# Patient Record
Sex: Male | Born: 1950
Health system: Southern US, Community
[De-identification: ages and names within clinical notes are randomized; demographics above are authoritative.]

## PROBLEM LIST (undated history)

## (undated) DIAGNOSIS — I5033 Acute on chronic diastolic (congestive) heart failure: Secondary | ICD-10-CM

## (undated) DIAGNOSIS — I10 Essential (primary) hypertension: Secondary | ICD-10-CM

## (undated) DIAGNOSIS — I350 Nonrheumatic aortic (valve) stenosis: Secondary | ICD-10-CM

## (undated) DIAGNOSIS — I5041 Acute combined systolic (congestive) and diastolic (congestive) heart failure: Secondary | ICD-10-CM

## (undated) DIAGNOSIS — I2510 Atherosclerotic heart disease of native coronary artery without angina pectoris: Secondary | ICD-10-CM

## (undated) DIAGNOSIS — I739 Peripheral vascular disease, unspecified: Secondary | ICD-10-CM

## (undated) DIAGNOSIS — E785 Hyperlipidemia, unspecified: Secondary | ICD-10-CM

## (undated) DIAGNOSIS — J9601 Acute respiratory failure with hypoxia: Secondary | ICD-10-CM

## (undated) DIAGNOSIS — I509 Heart failure, unspecified: Secondary | ICD-10-CM

## (undated) DIAGNOSIS — E119 Type 2 diabetes mellitus without complications: Secondary | ICD-10-CM

## (undated) DIAGNOSIS — I4891 Unspecified atrial fibrillation: Secondary | ICD-10-CM

## (undated) HISTORY — PX: CARDIAC CATHETERIZATION: SHX172

## (undated) HISTORY — DX: Atherosclerotic heart disease of native coronary artery without angina pectoris: I25.10

## (undated) HISTORY — DX: Heart failure, unspecified: I50.9

---

## 2000-12-20 ENCOUNTER — Emergency Department (HOSPITAL_COMMUNITY): Admission: EM | Admit: 2000-12-20 | Discharge: 2000-12-20 | Payer: Self-pay | Admitting: Emergency Medicine

## 2009-01-17 ENCOUNTER — Ambulatory Visit: Payer: Self-pay | Admitting: Family Medicine

## 2009-01-17 ENCOUNTER — Inpatient Hospital Stay (HOSPITAL_COMMUNITY): Admission: EM | Admit: 2009-01-17 | Discharge: 2009-01-21 | Payer: Self-pay | Admitting: Emergency Medicine
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
## 2009-01-18 LAB — CONVERTED CEMR LAB: Cholesterol: 155 mg/dL
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
## 2009-01-21 ENCOUNTER — Encounter (INDEPENDENT_AMBULATORY_CARE_PROVIDER_SITE_OTHER): Payer: Self-pay | Admitting: General Surgery

## 2009-01-21 ENCOUNTER — Encounter: Payer: Self-pay | Admitting: Family Medicine

## 2009-01-21 ENCOUNTER — Ambulatory Visit: Payer: Self-pay | Admitting: Vascular Surgery

## 2009-01-21 LAB — CONVERTED CEMR LAB: Hgb A1c MFr Bld: 11.4 %

## 2009-01-23 ENCOUNTER — Ambulatory Visit: Payer: Self-pay | Admitting: Family Medicine

## 2009-01-23 DIAGNOSIS — E1122 Type 2 diabetes mellitus with diabetic chronic kidney disease: Secondary | ICD-10-CM | POA: Insufficient documentation

## 2009-01-23 DIAGNOSIS — E1149 Type 2 diabetes mellitus with other diabetic neurological complication: Secondary | ICD-10-CM

## 2009-01-23 DIAGNOSIS — I10 Essential (primary) hypertension: Secondary | ICD-10-CM | POA: Insufficient documentation

## 2009-01-23 DIAGNOSIS — S72009A Fracture of unspecified part of neck of unspecified femur, initial encounter for closed fracture: Secondary | ICD-10-CM | POA: Insufficient documentation

## 2009-01-23 DIAGNOSIS — E1165 Type 2 diabetes mellitus with hyperglycemia: Secondary | ICD-10-CM

## 2009-02-06 ENCOUNTER — Telehealth: Payer: Self-pay | Admitting: Family Medicine

## 2009-02-11 ENCOUNTER — Telehealth: Payer: Self-pay | Admitting: *Deleted

## 2009-02-15 ENCOUNTER — Ambulatory Visit: Payer: Self-pay | Admitting: Family Medicine

## 2009-02-15 LAB — CONVERTED CEMR LAB
BUN: 16 mg/dL (ref 6–23)
CO2: 23 meq/L (ref 19–32)
Calcium: 9.8 mg/dL (ref 8.4–10.5)
Creatinine, Ser: 0.73 mg/dL (ref 0.40–1.50)
Glucose, Bld: 122 mg/dL — ABNORMAL HIGH (ref 70–99)

## 2009-03-07 ENCOUNTER — Telehealth: Payer: Self-pay | Admitting: *Deleted

## 2009-03-29 ENCOUNTER — Telehealth: Payer: Self-pay | Admitting: Family Medicine

## 2009-04-09 ENCOUNTER — Telehealth: Payer: Self-pay | Admitting: Family Medicine

## 2009-04-15 ENCOUNTER — Telehealth: Payer: Self-pay | Admitting: *Deleted

## 2009-04-23 ENCOUNTER — Telehealth: Payer: Self-pay | Admitting: Family Medicine

## 2009-05-14 ENCOUNTER — Telehealth: Payer: Self-pay | Admitting: Family Medicine

## 2009-05-22 ENCOUNTER — Ambulatory Visit: Payer: Self-pay | Admitting: Family Medicine

## 2009-05-22 DIAGNOSIS — N529 Male erectile dysfunction, unspecified: Secondary | ICD-10-CM

## 2009-05-24 DIAGNOSIS — E291 Testicular hypofunction: Secondary | ICD-10-CM

## 2009-05-24 LAB — CONVERTED CEMR LAB
BUN: 14 mg/dL (ref 6–23)
Chloride: 101 meq/L (ref 96–112)
Creatinine, Ser: 0.76 mg/dL (ref 0.40–1.50)
Testosterone: 220.87 ng/dL — ABNORMAL LOW (ref 350–890)

## 2009-06-04 ENCOUNTER — Telehealth: Payer: Self-pay | Admitting: Family Medicine

## 2009-06-07 ENCOUNTER — Ambulatory Visit: Payer: Self-pay | Admitting: Family Medicine

## 2009-06-21 ENCOUNTER — Ambulatory Visit: Payer: Self-pay | Admitting: Family Medicine

## 2009-07-05 ENCOUNTER — Ambulatory Visit: Payer: Self-pay | Admitting: Family Medicine

## 2009-07-08 ENCOUNTER — Telehealth: Payer: Self-pay | Admitting: Family Medicine

## 2009-07-19 ENCOUNTER — Ambulatory Visit: Payer: Self-pay | Admitting: Family Medicine

## 2010-03-27 ENCOUNTER — Encounter (INDEPENDENT_AMBULATORY_CARE_PROVIDER_SITE_OTHER): Payer: Self-pay | Admitting: *Deleted

## 2010-03-28 ENCOUNTER — Encounter: Payer: Self-pay | Admitting: *Deleted

## 2010-05-27 NOTE — Miscellaneous (Signed)
  Clinical Lists Changes Medical Records Request Received medical records request records went to [facility] Faxed on 02-26-2009 Sherrie George [facility])  March 27, 2010 9:48 AM

## 2010-05-27 NOTE — Progress Notes (Signed)
Summary: refill  Phone Note Refill Request Call back at [phone] Message from:  Patient  Refills Requested: Medication #1:  HYDROCODONE-ACETAMINOPHEN 5-500 MG TABS one by mouth q4h as needed pain Please call when ready  Initial call taken by: Audie Clear,  June 04, 2009 4:33 PM  Follow-up for Phone Call        to pcp Follow-up by: Elige Radon RN,  June 04, 2009 4:41 PM  Additional Follow-up for Phone Call Additional follow up Details #1::        Rx placed in envelope at front. Additional Follow-up by: Madison Hickman MD,  June 05, 2009 9:20 AM    New/Updated Medications: HYDROCODONE-ACETAMINOPHEN 5-500 MG TABS (HYDROCODONE-ACETAMINOPHEN) one by mouth q4h as needed pain Prescriptions: HYDROCODONE-ACETAMINOPHEN 5-500 MG TABS (HYDROCODONE-ACETAMINOPHEN) one by mouth q4h as needed pain  #45 x 0   Entered and Authorized by:   Madison Hickman MD   Signed by:   Madison Hickman MD on 06/05/2009   Method used:   Handwritten   [id number]  informed pt that rx was ready for pick up.Elige Radon RN  June 05, 2009 11:46 AM

## 2010-05-27 NOTE — Progress Notes (Signed)
Summary: Rx Req  Phone Note Refill Request Call back at Home Phone [phone] Message from:  Patient  Refills Requested: Medication #1:  HYDROCODONE-ACETAMINOPHEN 5-500 MG TABS one by mouth q4h as needed pain PLEASE CALL WHEN READY TO PICK UP.  Initial call taken by: Raymond Gurney,  May 14, 2009 3:24 PM  Follow-up for Phone Call        will forward to MD. Follow-up by: Marcell Barlow RN,  May 14, 2009 4:35 PM  Additional Follow-up for Phone Call Additional follow up Details #1::        I will put handwritten Rx up front along with a note saying he needs an appointment before any more refills.  Called and informed. Additional Follow-up by: Madison Hickman MD,  May 14, 2009 5:17 PM    Prescriptions: HYDROCODONE-ACETAMINOPHEN 5-500 MG TABS (HYDROCODONE-ACETAMINOPHEN) one by mouth q4h as needed pain  #30 x 0   Entered and Authorized by:   Madison Hickman MD   Signed by:   Madison Hickman MD on 05/14/2009   Method used:   Handwritten   RxID:   [id number]

## 2010-05-27 NOTE — Assessment & Plan Note (Signed)
Summary: testosterone inj,df  Nurse Visit   Allergies: No Known Drug Allergies  Medication Administration  Injection # 1:    Medication: Testosterone Cypionate to 100 MG    Diagnosis: ERECTILE DYSFUNCTION [id number])    Route: IM    Site: LUOQ gluteus    Exp Date: 12/2011    Lot #: [id number]    Mfr: [email or web address]    Comments: patient brings in his own medication. no charge for mediciation.  testosterone 100 mg given IM    Patient tolerated injection without complications    Given by: Marcell Barlow RN (June 07, 2009 4:18 PM)  Orders Added: 1)  Admin of Injection (IM/SQ) [phone]   Medication Administration  Injection # 1:    Medication: Testosterone Cypionate to 100 MG    Diagnosis: ERECTILE DYSFUNCTION (ICD-607.84)    Route: IM    Site: LUOQ gluteus    Exp Date: 12/2011    Lot #: [id number]    Mfr: [email or web address]    Comments: patient brings in his own medication. no charge for mediciation.  testosterone 100 mg given IM    Patient tolerated injection without complications    Given by: Marcell Barlow RN (June 07, 2009 4:18 PM)  Orders Added: 1)  Admin of Injection (IM/SQ) [phone]

## 2010-05-27 NOTE — Assessment & Plan Note (Signed)
Summary: testosterone shot,df  Nurse Visit   Allergies: No Known Drug Allergies  Medication Administration  Injection # 1:    Medication: Testosterone Cypionat 200mg  ing    Diagnosis: ERECTILE DYSFUNCTION [id number])    Route: IM    Site: RUOQ gluteus    Exp Date: 12/2011    Lot #: [id number]    Mfr: sandoz    Comments: Teststerone 100 mg given IM.  patient bring his own medication , no charge for medication.    Patient tolerated injection without complications    Given by: Marcell Barlow RN (July 19, 2009 3:07 PM)  Orders Added: 1)  Admin of Injection (IM/SQ) [phone]   Medication Administration  Injection # 1:    Medication: Testosterone Cypionat 200mg  ing    Diagnosis: ERECTILE DYSFUNCTION (ICD-607.84)    Route: IM    Site: RUOQ gluteus    Exp Date: 12/2011    Lot #: [id number]    Mfr: sandoz    Comments: Teststerone 100 mg given IM.  patient bring his own medication , no charge for medication.    Patient tolerated injection without complications    Given by: Marcell Barlow RN (July 19, 2009 3:07 PM)  Orders Added: 1)  Admin of Injection (IM/SQ) [phone]

## 2010-05-27 NOTE — Assessment & Plan Note (Signed)
Summary: testerone inj,tcb  Nurse Visit   Allergies: No Known Drug Allergies  Medication Administration  Injection # 1:    Medication: Testosterone Cypionate to 100 MG    Diagnosis: ERECTILE DYSFUNCTION [id number])    Route: IM    Site: LUOQ gluteus    Exp Date: 12/2011    Lot #: [id number]    Mfr: sandoz    Comments: patient brings in his own medication. no charge for med.  Testosterone Cypionate 100 mg given IM.    Patient tolerated injection without complications    Given by: Marcell Barlow RN (July 05, 2009 5:22 PM)  Orders Added: 1)  Admin of Injection (IM/SQ) [phone]   Medication Administration  Injection # 1:    Medication: Testosterone Cypionate to 100 MG    Diagnosis: ERECTILE DYSFUNCTION (ICD-607.84)    Route: IM    Site: LUOQ gluteus    Exp Date: 12/2011    Lot #: [id number]    Mfr: sandoz    Comments: patient brings in his own medication. no charge for med.  Testosterone Cypionate 100 mg given IM.    Patient tolerated injection without complications    Given by: Marcell Barlow RN (July 05, 2009 5:22 PM)  Orders Added: 1)  Admin of Injection (IM/SQ) [phone]

## 2010-05-27 NOTE — Progress Notes (Signed)
Summary: Refill  Phone Note Call from Patient Call back at [phone]   Reason for Call: Talk to Doctor Summary of Call: pt is requesting refill on viagra, pt goes to [facility]/[address] Initial call taken by: Samara Snide,  July 08, 2009 2:19 PM  Follow-up for Phone Call        to PCP Follow-up by: Isabelle Course,  July 09, 2009 1:48 PM  Additional Follow-up for Phone Call Additional follow up Details #1::        Called and discussed.  He is getting testosterone injections and using a vacuum assist device.  He has tried and failed viagra in the past.  Will try again now that he is getting testosterone. Additional Follow-up by: Madison Hickman MD,  July 09, 2009 2:34 PM    New/Updated Medications: VIAGRA 50 MG TABS (SILDENAFIL CITRATE) One by mouth 1/2 prior to activity Prescriptions: VIAGRA 50 MG TABS (SILDENAFIL CITRATE) One by mouth 1/2 prior to activity  #6 x 12   Entered and Authorized by:   Madison Hickman MD   Signed by:   Madison Hickman MD on 07/09/2009   Method used:   Electronically to        [address] (retail)       [address]       [place], [place]  [address]       Ph: [phone]       Fax: [phone]   RxID:   [id number]

## 2010-05-27 NOTE — Assessment & Plan Note (Signed)
Summary: testastrone inj,tcb  Nurse Visit   Allergies: No Known Drug Allergies  Medication Administration  Injection # 1:    Medication: Testosterone Cypionate to 100 MG    Diagnosis: ERECTILE DYSFUNCTION [id number])    Route: IM    Site: RUOQ gluteus    Exp Date: 12/2011    Lot #: [id number]    Mfr: sandoz    Comments: no charge for medication. brings bring medication to office to be administered. Testosterone Cypionate 100 mg given  IM    Patient tolerated injection without complications    Given by: Marcell Barlow RN (June 21, 2009 4:02 PM)  Orders Added: 1)  Admin of Injection (IM/SQ) [phone]   Medication Administration  Injection # 1:    Medication: Testosterone Cypionate to 100 MG    Diagnosis: ERECTILE DYSFUNCTION (ICD-607.84)    Route: IM    Site: RUOQ gluteus    Exp Date: 12/2011    Lot #: [id number]    Mfr: sandoz    Comments: no charge for medication. brings bring medication to office to be administered. Testosterone Cypionate 100 mg given  IM    Patient tolerated injection without complications    Given by: Marcell Barlow RN (June 21, 2009 4:02 PM)  Orders Added: 1)  Admin of Injection (IM/SQ) [phone]
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
## 2010-07-07 ENCOUNTER — Other Ambulatory Visit: Payer: Self-pay | Admitting: Family Medicine

## 2010-07-07 NOTE — Telephone Encounter (Signed)
Refill request

## 2010-07-29 NOTE — Miscellaneous (Signed)
  Clinical Lists Changes Medical Record Request Received medical record request Records went to [facility] attorney at law Faxed on 03/18/2009 [facility] Pysher  March 28, 2010 3:29 PM

## 2010-08-01 LAB — BASIC METABOLIC PANEL
BUN: 10 mg/dL (ref 6–23)
BUN: 10 mg/dL (ref 6–23)
BUN: 9 mg/dL (ref 6–23)
CO2: 27 mEq/L (ref 19–32)
CO2: 27 mEq/L (ref 19–32)
CO2: 28 mEq/L (ref 19–32)
Calcium: 8 mg/dL — ABNORMAL LOW (ref 8.4–10.5)
Chloride: 95 mEq/L — ABNORMAL LOW (ref 96–112)
Chloride: 95 mEq/L — ABNORMAL LOW (ref 96–112)
Chloride: 98 mEq/L (ref 96–112)
Chloride: 99 mEq/L (ref 96–112)
Creatinine, Ser: 0.74 mg/dL (ref 0.4–1.5)
GFR calc Af Amer: >60 mL/min (ref >60)
GFR calc non Af Amer: >60 mL/min (ref >60)
Glucose, Bld: 252 mg/dL — ABNORMAL HIGH (ref 70–99)
Glucose, Bld: 315 mg/dL — ABNORMAL HIGH (ref 70–99)
Potassium: 3.6 mEq/L (ref 3.5–5.1)
Potassium: 3.6 mEq/L (ref 3.5–5.1)
Potassium: 4.2 mEq/L (ref 3.5–5.1)
Sodium: 128 mEq/L — ABNORMAL LOW (ref 135–145)
Sodium: 130 mEq/L — ABNORMAL LOW (ref 135–145)

## 2010-08-01 LAB — GLUCOSE, CAPILLARY
Glucose-Capillary: 226 mg/dL — ABNORMAL HIGH (ref 70–99)
Glucose-Capillary: 236 mg/dL — ABNORMAL HIGH (ref 70–99)
Glucose-Capillary: 240 mg/dL — ABNORMAL HIGH (ref 70–99)
Glucose-Capillary: 260 mg/dL — ABNORMAL HIGH (ref 70–99)
Glucose-Capillary: 265 mg/dL — ABNORMAL HIGH (ref 70–99)
Glucose-Capillary: 276 mg/dL — ABNORMAL HIGH (ref 70–99)
Glucose-Capillary: 294 mg/dL — ABNORMAL HIGH (ref 70–99)

## 2010-08-01 LAB — CBC
HCT: 27.7 % — ABNORMAL LOW (ref 39.0–52.0)
HCT: 29.1 % — ABNORMAL LOW (ref 39.0–52.0)
HCT: 36 % — ABNORMAL LOW (ref 39.0–52.0)
HCT: 45 % (ref 39.0–52.0)
Hemoglobin: 10.1 g/dL — ABNORMAL LOW (ref 13.0–17.0)
Hemoglobin: 10.5 g/dL — ABNORMAL LOW (ref 13.0–17.0)
MCHC: 34.7 g/dL (ref 30.0–36.0)
MCHC: 35.2 g/dL (ref 30.0–36.0)
MCV: 93.3 fL (ref 78.0–100.0)
MCV: 93.6 fL (ref 78.0–100.0)
MCV: 94.5 fL (ref 78.0–100.0)
MCV: 95.2 fL (ref 78.0–100.0)
Platelets: 219 10*3/uL (ref 150–400)
Platelets: 225 10*3/uL (ref 150–400)
Platelets: 307 10*3/uL (ref 150–400)
RBC: 3.22 MIL/uL — ABNORMAL LOW (ref 4.22–5.81)
RDW: 12.5 % (ref 11.5–15.5)
RDW: 12.5 % (ref 11.5–15.5)
WBC: 14.8 10*3/uL — ABNORMAL HIGH (ref 4.0–10.5)
WBC: 5.5 10*3/uL (ref 4.0–10.5)
WBC: 7.6 10*3/uL (ref 4.0–10.5)
WBC: 7.9 10*3/uL (ref 4.0–10.5)

## 2010-08-01 LAB — APTT: aPTT: 24 seconds (ref 24–37)

## 2010-08-01 LAB — CULTURE, BLOOD (ROUTINE X 2)
Culture: NO GROWTH
Culture: NO GROWTH

## 2010-08-01 LAB — POCT I-STAT 4, (NA,K, GLUC, HGB,HCT)
HCT: 48 % (ref 39.0–52.0)
Hemoglobin: 15.3 g/dL (ref 13.0–17.0)
Hemoglobin: 16.3 g/dL (ref 13.0–17.0)
Potassium: 4.8 mEq/L (ref 3.5–5.1)
Sodium: 134 mEq/L — ABNORMAL LOW (ref 135–145)

## 2010-08-01 LAB — URINALYSIS, ROUTINE W REFLEX MICROSCOPIC
Bilirubin Urine: NEGATIVE
Bilirubin Urine: NEGATIVE
Hgb urine dipstick: NEGATIVE
Ketones, ur: 15 mg/dL — AB
Leukocytes, UA: NEGATIVE
Nitrite: NEGATIVE
Nitrite: NEGATIVE
Specific Gravity, Urine: 1.039 — ABNORMAL HIGH (ref 1.005–1.030)
Specific Gravity, Urine: >1.046 — ABNORMAL HIGH (ref 1.005–1.030)
Urobilinogen, UA: 1 mg/dL (ref 0.0–1.0)
pH: 5 (ref 5.0–8.0)

## 2010-08-01 LAB — POCT I-STAT, CHEM 8
BUN: 15 mg/dL (ref 6–23)
Sodium: 134 mEq/L — ABNORMAL LOW (ref 135–145)
TCO2: 25 mmol/L (ref 0–100)

## 2010-08-01 LAB — URINE CULTURE: Culture: NO GROWTH

## 2010-08-01 LAB — LIPID PANEL
Total CHOL/HDL Ratio: 4.7 RATIO
VLDL: 25 mg/dL (ref 0–40)

## 2010-08-01 LAB — HEMOGLOBIN A1C
Hgb A1c MFr Bld: 11 % — ABNORMAL HIGH (ref 4.6–6.1)
Mean Plasma Glucose: 280 mg/dL

## 2010-08-01 LAB — URINE MICROSCOPIC-ADD ON

## 2010-08-01 LAB — TYPE AND SCREEN: ABO/RH(D): A POS

## 2010-08-13 ENCOUNTER — Other Ambulatory Visit: Payer: Self-pay | Admitting: Family Medicine

## 2010-08-13 NOTE — Telephone Encounter (Signed)
Refill request

## 2010-08-14 NOTE — Telephone Encounter (Signed)
Refilled for one month only.  Not seen since 04/2009.  Must make appointment.

## 2010-09-17 ENCOUNTER — Other Ambulatory Visit: Payer: Self-pay | Admitting: Family Medicine

## 2010-09-18 NOTE — Telephone Encounter (Signed)
Refill request

## 2010-09-27 ENCOUNTER — Emergency Department (HOSPITAL_COMMUNITY)
Admission: EM | Admit: 2010-09-27 | Discharge: 2010-09-27 | Disposition: A | Discharge status: Home / Self Care | Payer: Self-pay | Attending: Emergency Medicine | Admitting: Emergency Medicine

## 2010-09-27 DIAGNOSIS — I10 Essential (primary) hypertension: Secondary | ICD-10-CM | POA: Insufficient documentation

## 2010-09-27 DIAGNOSIS — Z794 Long term (current) use of insulin: Secondary | ICD-10-CM | POA: Insufficient documentation

## 2010-09-27 DIAGNOSIS — E119 Type 2 diabetes mellitus without complications: Secondary | ICD-10-CM | POA: Insufficient documentation

## 2010-09-27 DIAGNOSIS — L255 Unspecified contact dermatitis due to plants, except food: Secondary | ICD-10-CM | POA: Insufficient documentation

## 2010-09-27 LAB — GLUCOSE, CAPILLARY: Glucose-Capillary: 297 mg/dL — ABNORMAL HIGH (ref 70–99)

## 2010-09-29 ENCOUNTER — Telehealth: Payer: Self-pay | Admitting: Family Medicine

## 2010-09-29 MED ORDER — METFORMIN HCL 1000 MG PO TABS: 1000.0000 mg | ORAL_TABLET | Freq: Two times a day (BID) | ORAL | Status: DC

## 2010-09-29 MED ORDER — LISINOPRIL 10 MG PO TABS: 10.0000 mg | ORAL_TABLET | Freq: Every day | ORAL | Status: DC

## 2010-09-29 MED ORDER — INSULIN GLARGINE 100 UNIT/ML ~~LOC~~ SOLN: 30.0000 [IU] | Freq: Every day | SUBCUTANEOUS | Status: DC

## 2010-09-29 NOTE — Telephone Encounter (Signed)
He was last seen 05/22/2009.  Will route this note to DR. Hensel to determine if meds should be filled before the OV on 10/24/10.

## 2010-09-29 NOTE — Telephone Encounter (Signed)
Mr. Buggy need refills sent to [facility] on [address] for his lisinopril,metformin and lantus.  Called pharmacy and they were told they the meds could not be refilled.  Told pt might be due to him needing to be seen.  Sched appt for 6/29 with Dr. Gertha Calkin so need meds called in before appt.

## 2010-09-29 NOTE — Telephone Encounter (Signed)
These requests have come in repeatedly and I have informed him he must make an appointment.  I am glad he has an appointment for 6/29 and I will refill one more time.

## 2010-10-02 ENCOUNTER — Other Ambulatory Visit: Payer: Self-pay | Admitting: Family Medicine

## 2010-10-03 NOTE — Telephone Encounter (Signed)
Refill request

## 2010-10-24 ENCOUNTER — Ambulatory Visit (INDEPENDENT_AMBULATORY_CARE_PROVIDER_SITE_OTHER): Payer: Self-pay | Admitting: Family Medicine

## 2010-10-24 ENCOUNTER — Encounter: Payer: Self-pay | Admitting: Family Medicine

## 2010-10-24 VITALS — BP 145/79 | HR 102 | Temp 98.3°F | Ht 72.0 in | Wt 245.0 lb

## 2010-10-24 DIAGNOSIS — I10 Essential (primary) hypertension: Secondary | ICD-10-CM

## 2010-10-24 DIAGNOSIS — Z125 Encounter for screening for malignant neoplasm of prostate: Secondary | ICD-10-CM

## 2010-10-24 DIAGNOSIS — E119 Type 2 diabetes mellitus without complications: Secondary | ICD-10-CM

## 2010-10-24 DIAGNOSIS — E7800 Pure hypercholesterolemia, unspecified: Secondary | ICD-10-CM

## 2010-10-24 LAB — LIPID PANEL: LDL Cholesterol: 146 mg/dL — ABNORMAL HIGH (ref 0–99)

## 2010-10-24 LAB — POCT GLYCOSYLATED HEMOGLOBIN (HGB A1C): Hemoglobin A1C: 8.6

## 2010-10-24 MED ORDER — METFORMIN HCL 1000 MG PO TABS: 1000.0000 mg | ORAL_TABLET | Freq: Two times a day (BID) | ORAL | Status: DC

## 2010-10-24 MED ORDER — INSULIN GLARGINE 100 UNIT/ML ~~LOC~~ SOLN: 35.0000 [IU] | Freq: Every day | SUBCUTANEOUS | Status: DC

## 2010-10-24 MED ORDER — LISINOPRIL-HYDROCHLOROTHIAZIDE 20-12.5 MG PO TABS: 1.0000 | ORAL_TABLET | Freq: Every day | ORAL | Status: DC

## 2010-10-24 NOTE — Patient Instructions (Signed)
I changed (increased) your Blood pressure med Increase insulin to 35 units daily Make sure you see Bonna Gains to see if you qualify for financial help Please send back the poop cards  See me in one month.

## 2010-10-24 NOTE — Progress Notes (Signed)
  Subjective:    Patient ID: Rodney [name], male    DOB: 02/21/1951, 60 y.o.   MRN: [id number]  HPI Rodney [name] has been laid off of his job, has no income and did not have follow up visits as a priority.  Also, now that he is not working, he has become a "couch potato" and has put on significant wt.  Only here because I would not refill meds.  Was not aware of the [employer]   Review of Systems  Feel fine.  Denies CP and SOB     Objective:   Physical Exam Heent nl Neck, supple without masses Lungs clear Cardiac RRR without m Abd benign Ext without edema       Assessment & Plan:

## 2010-10-24 NOTE — Assessment & Plan Note (Signed)
Poor control, Add HCTZ

## 2010-10-25 LAB — COMPLETE METABOLIC PANEL WITH GFR
ALT: 27 U/L (ref 0–53)
Albumin: 4.2 g/dL (ref 3.5–5.2)
CO2: 28 mEq/L (ref 19–32)
Calcium: 9.5 mg/dL (ref 8.4–10.5)
Chloride: 101 mEq/L (ref 96–112)
GFR, Est African American: >60 mL/min (ref >60)
Glucose, Bld: 181 mg/dL — ABNORMAL HIGH (ref 70–99)
Potassium: 4.8 mEq/L (ref 3.5–5.3)
Sodium: 138 mEq/L (ref 135–145)
Total Bilirubin: 0.4 mg/dL (ref 0.3–1.2)
Total Protein: 6.8 g/dL (ref 6.0–8.3)

## 2010-10-27 ENCOUNTER — Encounter: Payer: Self-pay | Admitting: Family Medicine

## 2010-10-27 NOTE — Assessment & Plan Note (Signed)
Due for screening.  Will need other colonoscopy if becomes [facility] qualified.

## 2010-10-27 NOTE — Assessment & Plan Note (Signed)
Poor control, increase lantus and increase exercise.

## 2010-10-28 ENCOUNTER — Encounter: Payer: Self-pay | Admitting: Family Medicine

## 2010-10-28 DIAGNOSIS — E7800 Pure hypercholesterolemia, unspecified: Secondary | ICD-10-CM | POA: Insufficient documentation

## 2010-10-28 MED ORDER — SIMVASTATIN 20 MG PO TABS: 20.0000 mg | ORAL_TABLET | Freq: Every evening | ORAL | Status: DC

## 2010-10-28 NOTE — Progress Notes (Signed)
  Subjective:    Patient ID: Rodney [name], male    DOB: 06-17-1950, 60 y.o.   MRN: [id number]  HPI Called and notified about high cholesterol and elevated PSA.  Will start on simvastatin and repeat PSA in one month.  No symptoms of prostate infection.    Review of Systems     Objective:   Physical Exam        Assessment & Plan:

## 2010-10-28 NOTE — Progress Notes (Signed)
Addended by: Domenic Schwab on: 10/28/2010 11:45 AM   Modules accepted: Orders

## 2010-11-26 ENCOUNTER — Telehealth: Payer: Self-pay | Admitting: Family Medicine

## 2010-11-26 ENCOUNTER — Ambulatory Visit (INDEPENDENT_AMBULATORY_CARE_PROVIDER_SITE_OTHER): Payer: Self-pay | Admitting: Family Medicine

## 2010-11-26 VITALS — BP 94/66 | HR 64 | Wt 242.1 lb

## 2010-11-26 DIAGNOSIS — I10 Essential (primary) hypertension: Secondary | ICD-10-CM

## 2010-11-26 DIAGNOSIS — R55 Syncope and collapse: Secondary | ICD-10-CM

## 2010-11-26 DIAGNOSIS — N179 Acute kidney failure, unspecified: Secondary | ICD-10-CM

## 2010-11-26 NOTE — Telephone Encounter (Signed)
When I called patient he was on his way to the ED.  He never lost consciousness, just had a syncopal episode.  Advised him tot come to the clinic since Dr. Andria Frames was seeing patients this afternoon.

## 2010-11-26 NOTE — Patient Instructions (Signed)
Get a big bottle of gatoraid and drink it and stay in air conditioning for the rest of the day. No work [employer], do not take any blood pressure meds. Starting Friday, take 1/2 tab of your lisinopril HC daily.   Over the next couple weeks, check your blood pressure several times, write it down and call with the results.

## 2010-11-26 NOTE — Telephone Encounter (Signed)
Had to leave work b/c he almost passed out and is swimmy headed.  He thinks it was caused by the bp meds.  Wants to talk to Dr Andria Frames

## 2010-11-27 ENCOUNTER — Encounter: Payer: Self-pay | Admitting: Family Medicine

## 2010-11-27 DIAGNOSIS — N179 Acute kidney failure, unspecified: Secondary | ICD-10-CM | POA: Insufficient documentation

## 2010-11-27 DIAGNOSIS — R55 Syncope and collapse: Secondary | ICD-10-CM | POA: Insufficient documentation

## 2010-11-27 LAB — BASIC METABOLIC PANEL WITH GFR
Chloride: 95 mEq/L — ABNORMAL LOW (ref 96–112)
GFR, Est Non African American: 28 mL/min — ABNORMAL LOW (ref >60)
Potassium: 5.4 mEq/L — ABNORMAL HIGH (ref 3.5–5.3)
Sodium: 134 mEq/L — ABNORMAL LOW (ref 135–145)

## 2010-11-27 NOTE — Progress Notes (Signed)
Addended by: Domenic Schwab on: 11/27/2010 11:31 AM   Modules accepted: Orders

## 2010-11-27 NOTE — Progress Notes (Signed)
  Subjective:    Patient ID: Rodney [name], male    DOB: 07/22/1950, 60 y.o.   MRN: [id number]  HPI acute renal failure noted patient called and given results. Continue to hold HCTZ and ACE also hold metformin commune Monday for nurse visit to check blood pressure and for the BMP.    Review of Systems     Objective:   Physical Exam        Assessment & Plan:

## 2010-11-27 NOTE — Assessment & Plan Note (Signed)
Likely due to overmedication and heat exhaustion   Hold ACE HCTZ hydrate no work tomorrow BMP ordered  Followup next week

## 2010-11-27 NOTE — Progress Notes (Signed)
  Subjective:    Patient ID: Rodney [name], male    DOB: 25-Mar-1951, 60 y.o.   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 No seizure activity no chest pain prior to passing out no shortness of breath regained consciousness and felt weak and crampy. This was his first day of work and he has not acclimated to the heat  He also has recently had his blood pressure medicines increased doubling his doubling his ACE and adding HCTZ. Prior to today he has had some spells of lightheadedness on standing  Review of Systems     Objective:   Physical Exam Low blood pressure noted mentating clearly  HEENT normal neck supple without mass lungs clear heart regular rate and rhythm without murmur or gallop abdomen benign extremities no edema       Assessment & Plan:

## 2010-11-28 ENCOUNTER — Ambulatory Visit: Payer: Self-pay | Admitting: Family Medicine

## 2010-12-01 ENCOUNTER — Other Ambulatory Visit: Payer: Self-pay

## 2010-12-01 DIAGNOSIS — N179 Acute kidney failure, unspecified: Secondary | ICD-10-CM

## 2010-12-01 NOTE — Progress Notes (Signed)
Bmp done today Rodney [name] 

## 2010-12-02 ENCOUNTER — Encounter: Payer: Self-pay | Admitting: Family Medicine

## 2010-12-02 LAB — BASIC METABOLIC PANEL WITH GFR
CO2: 22 mEq/L (ref 19–32)
Calcium: 9.1 mg/dL (ref 8.4–10.5)
Chloride: 101 mEq/L (ref 96–112)
Potassium: 4.1 mEq/L (ref 3.5–5.3)
Sodium: 135 mEq/L (ref 135–145)

## 2011-01-15 ENCOUNTER — Telehealth: Payer: Self-pay | Admitting: Family Medicine

## 2011-01-15 NOTE — Telephone Encounter (Signed)
Benedikt girlfriend is calling because Jsoeph needs his medication and has lost the paper to help him get his meds that Dr. Andria Frames gave him.  She would like Dr. Andria Frames to give her a call.

## 2011-01-15 NOTE — Telephone Encounter (Signed)
Pt wanted a copy of the [facility] paperwork will come by tomorrow to pick up from front desk.Maryruth Eve, Lahoma Crocker

## 2011-03-23 ENCOUNTER — Telehealth: Payer: Self-pay | Admitting: Family Medicine

## 2011-03-23 NOTE — Telephone Encounter (Signed)
Brought in forms to help pt pay for meds, to be filled out by Dr. Andria Frames.

## 2011-03-23 NOTE — Telephone Encounter (Signed)
Our office no longer fills out forms for medication assistance.  Patient advised to set up an appointment with [facility].  Lauralyn Primes

## 2011-04-30 ENCOUNTER — Telehealth: Payer: Self-pay | Admitting: Family Medicine

## 2011-04-30 DIAGNOSIS — E119 Type 2 diabetes mellitus without complications: Secondary | ICD-10-CM

## 2011-04-30 MED ORDER — METFORMIN HCL 1000 MG PO TABS: 1000.0000 mg | ORAL_TABLET | Freq: Two times a day (BID) | ORAL | Status: DC

## 2011-04-30 NOTE — Telephone Encounter (Signed)
Rodney [name] requesting enough Metformin be sent to Rodney [name] at [facility] to get Rodney [name] to his appointment on 05/08/2011.  Refill sent.  Rodney [name]

## 2011-04-30 NOTE — Telephone Encounter (Signed)
pls call [name] at [phone] to let her know

## 2011-04-30 NOTE — Telephone Encounter (Signed)
Patients family member is calling because she was told he needed to be seen before any meds were prescribed.  He has an appt next Friday 1/11 and she was told by the pharmacy that if an Rx could be faxed to [facility] on [address], they could give him enough to last until the appt.  She would like a call back.

## 2011-05-08 ENCOUNTER — Ambulatory Visit (INDEPENDENT_AMBULATORY_CARE_PROVIDER_SITE_OTHER): Payer: Self-pay | Admitting: Family Medicine

## 2011-05-08 ENCOUNTER — Encounter: Payer: Self-pay | Admitting: Family Medicine

## 2011-05-08 VITALS — BP 125/73 | HR 112 | Ht 72.0 in | Wt 246.0 lb

## 2011-05-08 DIAGNOSIS — Z1211 Encounter for screening for malignant neoplasm of colon: Secondary | ICD-10-CM

## 2011-05-08 DIAGNOSIS — Z23 Encounter for immunization: Secondary | ICD-10-CM

## 2011-05-08 DIAGNOSIS — E7800 Pure hypercholesterolemia, unspecified: Secondary | ICD-10-CM

## 2011-05-08 DIAGNOSIS — E119 Type 2 diabetes mellitus without complications: Secondary | ICD-10-CM

## 2011-05-08 LAB — POCT GLYCOSYLATED HEMOGLOBIN (HGB A1C): Hemoglobin A1C: 12

## 2011-05-08 MED ORDER — SIMVASTATIN 20 MG PO TABS: 20.0000 mg | ORAL_TABLET | Freq: Every day | ORAL | Status: DC

## 2011-05-08 MED ORDER — INSULIN NPH ISOPHANE & REGULAR (70-30) 100 UNIT/ML ~~LOC~~ SUSP: SUBCUTANEOUS | Status: DC

## 2011-05-08 NOTE — Patient Instructions (Signed)
See me in two months.  Tell whatever pharmacy you go to that I will be happy to substitute the insulin or cholesterol lowering med to whatever is cheapest for you

## 2011-05-11 ENCOUNTER — Telehealth: Payer: Self-pay | Admitting: Family Medicine

## 2011-05-11 DIAGNOSIS — E119 Type 2 diabetes mellitus without complications: Secondary | ICD-10-CM

## 2011-05-11 MED ORDER — METFORMIN HCL 1000 MG PO TABS: 1000.0000 mg | ORAL_TABLET | Freq: Two times a day (BID) | ORAL | Status: DC

## 2011-05-11 NOTE — Telephone Encounter (Signed)
Called and LM that new Rx sent

## 2011-05-11 NOTE — Progress Notes (Signed)
  Subjective:    Patient ID: ROBERTLEE GOGUE, male    DOB: Oct 08, 1950, 61 y.o.   MRN: [id number]  HPI  Shadrick is in for a recheck of DM and HBP.  He admits that he has been out of his lantus for weeks due to cost.  Wonders if we can do something cheaper - something that is covered by [facility].   No other complaints.  Needs colon cancer screening.  Since we have no source for colonoscopies, sent home with hemocults.    Review of Systems     Objective:   Physical Exam Gen WNWD Cardiac RRR without m or g Lungs clear Ext no edema        Assessment & Plan:

## 2011-05-11 NOTE — Assessment & Plan Note (Signed)
Will place on twice a day 70/30 insulin for cost reasons.  Poor control but cannot judge regimen without him on insulin regularly.

## 2011-05-11 NOTE — Telephone Encounter (Signed)
Need new refill for metformin sent to [facility] at [facility].  Please let patient know when sent

## 2011-05-28 LAB — HEMOCCULT GUIAC POC 1CARD (OFFICE)
Card #2 Fecal Occult Blod, POC: NEGATIVE
Fecal Occult Blood, POC: NEGATIVE

## 2011-05-28 NOTE — Progress Notes (Signed)
Addended by: [place], Letzy Gullickson on: 05/28/2011 04:24 PM   Modules accepted: Orders

## 2011-06-05 ENCOUNTER — Other Ambulatory Visit: Payer: Self-pay | Admitting: Family Medicine

## 2011-06-05 DIAGNOSIS — I10 Essential (primary) hypertension: Secondary | ICD-10-CM

## 2011-06-05 MED ORDER — LISINOPRIL-HYDROCHLOROTHIAZIDE 20-12.5 MG PO TABS: 0.5000 | ORAL_TABLET | Freq: Every day | ORAL | Status: DC

## 2011-06-05 NOTE — Assessment & Plan Note (Signed)
Refill per patient request.  See phone note

## 2011-06-05 NOTE — Telephone Encounter (Signed)
Rodney [name] said that an Rx for Lisinopril had been called in the last time he saw Dr. Andria Frames, but he had not been able to afford it until now, but [facility] on [facility] doesn't have it and it needs to be resent.

## 2012-02-02 ENCOUNTER — Telehealth: Payer: Self-pay | Admitting: Family Medicine

## 2012-02-02 NOTE — Telephone Encounter (Signed)
Spoke with Crystal at [facility] and she checked to make sure that expired card can be accepted through Oct and this is verified,  Patient notified and he must take expired card with him.

## 2012-02-02 NOTE — Telephone Encounter (Signed)
Pt has been out if his insulin for a month - has no money and needs to know if we have any samples - can't make appt until he gets his orange card renewed.

## 2012-02-02 NOTE — Telephone Encounter (Signed)
Noted.  Compliance due to finances has been an issue in the past.

## 2012-02-02 NOTE — Telephone Encounter (Signed)
We do not have samples of Novolin 70/30. Spoke with patient and he states he has been on Lantus in the past  but was changed to 70/30 because [facility] didn't have Lantus.  We do have Lantus samples.  Advised will send message to Dr. Andria Frames.     Patient plans to come in to see [facility] regarding renewal of orange card  but is waiting for her to be able to get him in .    Checked with Pamala Hurry and she states [facility] has agreed to honor expired [employer] through end of October. Patient notified and   He will go by there and check on this . Has a current Rx at pharmacy.Will call back if any problem.

## 2012-03-07 ENCOUNTER — Encounter: Payer: Self-pay | Admitting: Home Health Services

## 2012-03-09 ENCOUNTER — Encounter: Payer: Self-pay | Admitting: Home Health Services

## 2012-05-10 ENCOUNTER — Telehealth: Payer: Self-pay | Admitting: *Deleted

## 2012-05-10 NOTE — Telephone Encounter (Signed)
Patient came to office stating he still has not gotten orange card renewed . Continues to be on waiting list to meet with [facility].   We have no samples of Humulin 70/30 . Patient was switched to this in the past because [facility] did not Lantus.  Was taking 25 units in AM and 15 units before dinner. Now he cannot get Humulin 70/30 and we have no samples. Consulted with Dr. Andria Frames and he advises may start Lantus 40 units daily.  One sample given. He is advised that when he can get orange card may switch back to Humulin 70/30 with previous directions.  Medication Samples have been provided to the patient.  Drug name: Lantus  Qty:1  LOT [id number]  Exp.Date: 09/2013  The patient has been instructed regarding the correct time, dose, and frequency of taking this medication, including desired effects and most common side effects.   Christiane Ha 8:54 AM 05/10/2012

## 2012-05-11 NOTE — Telephone Encounter (Signed)
I did discuss with RN Tamala Julian and verify her excellent documentation.

## 2012-05-17 ENCOUNTER — Other Ambulatory Visit: Payer: Self-pay | Admitting: Family Medicine

## 2012-07-13 ENCOUNTER — Other Ambulatory Visit: Payer: Self-pay | Admitting: Family Medicine

## 2012-07-13 ENCOUNTER — Encounter: Payer: Self-pay | Admitting: *Deleted

## 2012-07-13 DIAGNOSIS — I10 Essential (primary) hypertension: Secondary | ICD-10-CM

## 2012-07-13 MED ORDER — LISINOPRIL-HYDROCHLOROTHIAZIDE 20-12.5 MG PO TABS: 0.5000 | ORAL_TABLET | Freq: Every day | ORAL | Status: DC

## 2012-08-30 ENCOUNTER — Telehealth: Payer: Self-pay | Admitting: *Deleted

## 2012-08-30 DIAGNOSIS — I10 Essential (primary) hypertension: Secondary | ICD-10-CM

## 2012-08-30 NOTE — Telephone Encounter (Signed)
Fax received from [facility]. Stating that [facility] can obtain Accuretic for free instead of Lisinopril/hctz (requiring copay) - would you consider writing a new rx? Please advise. Thanks ! Gill Delrossi, Ines Bloomer, RN

## 2012-08-31 MED ORDER — QUINAPRIL-HYDROCHLOROTHIAZIDE 20-12.5 MG PO TABS: 1.0000 | ORAL_TABLET | Freq: Every day | ORAL | Status: DC

## 2012-08-31 NOTE — Assessment & Plan Note (Signed)
Will switch due to formulary.

## 2012-08-31 NOTE — Telephone Encounter (Signed)
Pt called and informed that new BP med has been faxed to [facility] and would not require a copay via voicemail. Advised id any further questions to please call clinic. Dariana Garbett, Ines Bloomer, RN

## 2013-03-29 ENCOUNTER — Ambulatory Visit: Payer: Self-pay

## 2013-03-31 ENCOUNTER — Telehealth: Payer: Self-pay | Admitting: *Deleted

## 2013-03-31 NOTE — Telephone Encounter (Signed)
Attempted to call patient, number in chart is not valid. He has an appointment with Pamala Hurry on the 19th, will try to draw his LDL and A1C that day.Kynslie Ringle, Kevin Fenton

## 2013-04-13 ENCOUNTER — Ambulatory Visit: Payer: No Typology Code available for payment source

## 2013-04-25 ENCOUNTER — Other Ambulatory Visit: Payer: Self-pay | Admitting: Family Medicine

## 2013-04-25 DIAGNOSIS — E119 Type 2 diabetes mellitus without complications: Secondary | ICD-10-CM

## 2013-04-25 MED ORDER — INSULIN NPH ISOPHANE & REGULAR (70-30) 100 UNIT/ML ~~LOC~~ SUSP: SUBCUTANEOUS | Status: DC

## 2013-04-25 NOTE — Telephone Encounter (Signed)
Refilled per fax request from pharm

## 2013-05-14 ENCOUNTER — Other Ambulatory Visit: Payer: Self-pay | Admitting: Family Medicine

## 2013-07-31 ENCOUNTER — Other Ambulatory Visit: Payer: Self-pay | Admitting: Family Medicine

## 2013-07-31 ENCOUNTER — Other Ambulatory Visit: Payer: Self-pay | Admitting: *Deleted

## 2013-07-31 DIAGNOSIS — E119 Type 2 diabetes mellitus without complications: Secondary | ICD-10-CM

## 2013-07-31 MED ORDER — INSULIN NPH ISOPHANE & REGULAR (70-30) 100 UNIT/ML ~~LOC~~ SUSP: SUBCUTANEOUS | Status: DC

## 2013-07-31 NOTE — Telephone Encounter (Signed)
Made appt for Rodney [name] on 5/13.  Please give one refill for his Metformin until appt.

## 2013-07-31 NOTE — Telephone Encounter (Signed)
Done

## 2013-08-22 ENCOUNTER — Other Ambulatory Visit: Payer: Self-pay | Admitting: *Deleted

## 2013-08-22 DIAGNOSIS — I10 Essential (primary) hypertension: Secondary | ICD-10-CM

## 2013-09-06 ENCOUNTER — Encounter: Payer: Self-pay | Admitting: Family Medicine

## 2013-09-06 ENCOUNTER — Telehealth: Payer: Self-pay | Admitting: *Deleted

## 2013-09-06 ENCOUNTER — Ambulatory Visit (INDEPENDENT_AMBULATORY_CARE_PROVIDER_SITE_OTHER): Payer: No Typology Code available for payment source | Admitting: Family Medicine

## 2013-09-06 VITALS — BP 145/69 | HR 109 | Temp 99.0°F | Ht 70.0 in | Wt 254.0 lb

## 2013-09-06 DIAGNOSIS — IMO0001 Reserved for inherently not codable concepts without codable children: Secondary | ICD-10-CM

## 2013-09-06 DIAGNOSIS — I10 Essential (primary) hypertension: Secondary | ICD-10-CM

## 2013-09-06 DIAGNOSIS — F17200 Nicotine dependence, unspecified, uncomplicated: Secondary | ICD-10-CM

## 2013-09-06 DIAGNOSIS — Z720 Tobacco use: Secondary | ICD-10-CM

## 2013-09-06 DIAGNOSIS — E1165 Type 2 diabetes mellitus with hyperglycemia: Secondary | ICD-10-CM

## 2013-09-06 DIAGNOSIS — Z1211 Encounter for screening for malignant neoplasm of colon: Secondary | ICD-10-CM

## 2013-09-06 DIAGNOSIS — E7800 Pure hypercholesterolemia, unspecified: Secondary | ICD-10-CM

## 2013-09-06 DIAGNOSIS — Z23 Encounter for immunization: Secondary | ICD-10-CM

## 2013-09-06 DIAGNOSIS — E669 Obesity, unspecified: Secondary | ICD-10-CM

## 2013-09-06 DIAGNOSIS — IMO0002 Reserved for concepts with insufficient information to code with codable children: Secondary | ICD-10-CM

## 2013-09-06 LAB — LIPID PANEL
CHOLESTEROL: 211 mg/dL — AB (ref 0–200)
HDL: 36 mg/dL — ABNORMAL LOW (ref >39)
LDL CALC: 130 mg/dL — AB (ref 0–99)
Total CHOL/HDL Ratio: 5.9 Ratio
Triglycerides: 224 mg/dL — ABNORMAL HIGH (ref <150)
VLDL: 45 mg/dL — AB (ref 0–40)

## 2013-09-06 LAB — COMPLETE METABOLIC PANEL WITH GFR
ALBUMIN: 3.9 g/dL (ref 3.5–5.2)
ALT: 18 U/L (ref 0–53)
AST: 11 U/L (ref 0–37)
Alkaline Phosphatase: 54 U/L (ref 39–117)
BUN: 16 mg/dL (ref 6–23)
CHLORIDE: 100 meq/L (ref 96–112)
CO2: 26 mEq/L (ref 19–32)
Calcium: 9.5 mg/dL (ref 8.4–10.5)
Creat: 0.91 mg/dL (ref 0.50–1.35)
GFR, Est Non African American: >89 mL/min
Glucose, Bld: 296 mg/dL — ABNORMAL HIGH (ref 70–99)
POTASSIUM: 4.3 meq/L (ref 3.5–5.3)
Sodium: 132 mEq/L — ABNORMAL LOW (ref 135–145)
Total Bilirubin: 0.3 mg/dL (ref 0.2–1.2)
Total Protein: 6.7 g/dL (ref 6.0–8.3)

## 2013-09-06 LAB — POCT GLYCOSYLATED HEMOGLOBIN (HGB A1C): Hemoglobin A1C: 13

## 2013-09-06 MED ORDER — ROSUVASTATIN CALCIUM 20 MG PO TABS: 20.0000 mg | ORAL_TABLET | Freq: Every day | ORAL | Status: DC

## 2013-09-06 MED ORDER — QUINAPRIL-HYDROCHLOROTHIAZIDE 20-12.5 MG PO TABS: 1.0000 | ORAL_TABLET | Freq: Every day | ORAL | Status: DC

## 2013-09-06 MED ORDER — METFORMIN HCL 1000 MG PO TABS: ORAL_TABLET | ORAL | Status: DC

## 2013-09-06 MED ORDER — INSULIN NPH ISOPHANE & REGULAR (70-30) 100 UNIT/ML ~~LOC~~ SUSP: SUBCUTANEOUS | Status: DC

## 2013-09-06 NOTE — Patient Instructions (Signed)
See me in two months. If you get serious about quitting smoking, our pharmacologist could help. I will call with blood work results. Remember to increase your insulin dose to 40 units in the morning and 20 units at night. Get back to walking. Your full time job is taking better care of yourself.

## 2013-09-06 NOTE — Telephone Encounter (Signed)
Dawn from [facility] call to verify dose increase of pt's insulin (40 units before breakfast and 20 units before dinner) and ok to change from Novolin 70/30 to Humulin 70/30.  Per Dr. Andria Frames verbal order, ok to change to Humulin 70/30 and the dosage is correct.  Derl Barrow, RN

## 2013-09-07 ENCOUNTER — Encounter: Payer: Self-pay | Admitting: Family Medicine

## 2013-09-07 DIAGNOSIS — E1149 Type 2 diabetes mellitus with other diabetic neurological complication: Secondary | ICD-10-CM | POA: Insufficient documentation

## 2013-09-07 DIAGNOSIS — Z720 Tobacco use: Secondary | ICD-10-CM | POA: Insufficient documentation

## 2013-09-07 DIAGNOSIS — Z1211 Encounter for screening for malignant neoplasm of colon: Secondary | ICD-10-CM | POA: Insufficient documentation

## 2013-09-07 DIAGNOSIS — E1165 Type 2 diabetes mellitus with hyperglycemia: Secondary | ICD-10-CM

## 2013-09-07 DIAGNOSIS — E1140 Type 2 diabetes mellitus with diabetic neuropathy, unspecified: Secondary | ICD-10-CM | POA: Insufficient documentation

## 2013-09-07 DIAGNOSIS — Z794 Long term (current) use of insulin: Secondary | ICD-10-CM

## 2013-09-07 NOTE — Assessment & Plan Note (Signed)
Hemacults given

## 2013-09-07 NOTE — Assessment & Plan Note (Signed)
Will switch to crestor due to [facility] formulary reasons.  Calculated CAD risk: Current 10 y risk is 43% and likely higher factoring in poor control of DM.  Risk if ideal control would only be 6%

## 2013-09-07 NOTE — Progress Notes (Signed)
   Subjective:    Patient ID: Rodney [name], male    DOB: 04-29-1950, 63 y.o.   MRN: [id number]  HPI Where do I begin with Rodney [name].  Only here now because I refused to fill his medications.  He has the orange card and only has a $10 co pay.  Still, he has been unemployed for two years and that $10 is a lot for him. Diabetes.  States he has been taking both his insulin and his metformin.  Has polyuria.  HgbA1C is terrible. Hypertension: states he has been compliant with ACE/HCTZ Obesity - worse.  He is not exercising and admits to pretty much laying around on the couch.   High cholesterol.  States he has not been taking his statin.   Tobacco, continues to smoke and expresses little interest in quitting.    Review of Systems     Objective:   Physical Exam HEENT rhinophyma.   Lungs clear Cardiac RRR without m or g Abd benign Ext no edema.        Assessment & Plan:

## 2013-09-07 NOTE — Assessment & Plan Note (Addendum)
Terrible control due to a combo of lifestyle, perhaps compliance and I believe progression of disease leading to an inadquate current dose of insulin.  Will do retinopathy screen

## 2013-09-07 NOTE — Assessment & Plan Note (Signed)
A very important part of his terrible lifestyle choices.

## 2013-09-07 NOTE — Telephone Encounter (Signed)
Agree as documented by nurse Hassell Done.

## 2013-09-07 NOTE — Assessment & Plan Note (Signed)
Certainly part of the major risk factor issues

## 2013-09-07 NOTE — Assessment & Plan Note (Signed)
Borderline control which I fear will get worse if we control his DM and lose the diuretic effect of hyperglycemia.

## 2013-10-24 ENCOUNTER — Other Ambulatory Visit: Payer: Self-pay | Admitting: *Deleted

## 2013-10-24 ENCOUNTER — Other Ambulatory Visit: Payer: Self-pay | Admitting: Family Medicine

## 2013-10-24 DIAGNOSIS — IMO0002 Reserved for concepts with insufficient information to code with codable children: Secondary | ICD-10-CM

## 2013-10-24 DIAGNOSIS — E1165 Type 2 diabetes mellitus with hyperglycemia: Secondary | ICD-10-CM

## 2013-10-24 MED ORDER — INSULIN NPH ISOPHANE & REGULAR (70-30) 100 UNIT/ML ~~LOC~~ SUSP: SUBCUTANEOUS | Status: DC

## 2013-11-23 ENCOUNTER — Ambulatory Visit: Payer: Self-pay

## 2013-12-29 ENCOUNTER — Ambulatory Visit: Payer: Self-pay | Admitting: Family Medicine

## 2014-01-03 ENCOUNTER — Encounter: Payer: Self-pay | Admitting: Family Medicine

## 2014-01-03 ENCOUNTER — Ambulatory Visit (INDEPENDENT_AMBULATORY_CARE_PROVIDER_SITE_OTHER): Payer: Self-pay | Admitting: Family Medicine

## 2014-01-03 VITALS — BP 138/66 | HR 97 | Temp 98.2°F | Ht 70.0 in | Wt 254.0 lb

## 2014-01-03 DIAGNOSIS — E7800 Pure hypercholesterolemia, unspecified: Secondary | ICD-10-CM

## 2014-01-03 DIAGNOSIS — IMO0002 Reserved for concepts with insufficient information to code with codable children: Secondary | ICD-10-CM

## 2014-01-03 DIAGNOSIS — Z720 Tobacco use: Secondary | ICD-10-CM

## 2014-01-03 DIAGNOSIS — F17200 Nicotine dependence, unspecified, uncomplicated: Secondary | ICD-10-CM

## 2014-01-03 DIAGNOSIS — E1165 Type 2 diabetes mellitus with hyperglycemia: Secondary | ICD-10-CM

## 2014-01-03 DIAGNOSIS — I10 Essential (primary) hypertension: Secondary | ICD-10-CM

## 2014-01-03 DIAGNOSIS — IMO0001 Reserved for inherently not codable concepts without codable children: Secondary | ICD-10-CM

## 2014-01-03 DIAGNOSIS — Z23 Encounter for immunization: Secondary | ICD-10-CM

## 2014-01-03 LAB — POCT GLYCOSYLATED HEMOGLOBIN (HGB A1C): Hemoglobin A1C: 12

## 2014-01-03 LAB — LDL CHOLESTEROL, DIRECT: LDL DIRECT: 92 mg/dL

## 2014-01-03 MED ORDER — INSULIN NPH ISOPHANE & REGULAR (70-30) 100 UNIT/ML ~~LOC~~ SUSP: SUBCUTANEOUS | Status: DC

## 2014-01-03 NOTE — Assessment & Plan Note (Addendum)
Some improved.  Less than 1/2 ppd

## 2014-01-03 NOTE — Patient Instructions (Addendum)
I will check your cholesterol now that you are on your medication. Your blood pressure is OK, borderline high. Great work on the smoking.  Keep cutting back. You get a flu shot today. The mole on your back is a seborrheic keratosis - it is benign, no worry. Increase your insulin 70/30 to 45 units each morning and 25 units each evening.   See me in three months.  I am pleased with the progress over the last three months.

## 2014-01-04 NOTE — Assessment & Plan Note (Signed)
Poor control.  Increase exercise, focus on diet.  Increase insulin.

## 2014-01-04 NOTE — Progress Notes (Signed)
   Subjective:    Patient ID: Rodney [name], male    DOB: February 29, 1951, 63 y.o.   MRN: [id number]  HPI Follow up multiple issues HBP - OK, borderline high Chol.  Needs recheck now that he is taking meds. DM poor control.  States he is eating better and walking more Tobacco - has cut back to less than 1/2 ppd.    Review of Systems     Objective:   Physical ExamLungs clear BP noted Cardiac RRR without m or g Ext no edema        Assessment & Plan:

## 2014-01-04 NOTE — Assessment & Plan Note (Signed)
OK control.  Focus on diet and exercise.

## 2014-01-04 NOTE — Assessment & Plan Note (Signed)
LDL now controled since he is taking crestor.

## 2014-01-04 NOTE — Assessment & Plan Note (Signed)
No change.  Continue to focus on diet and exercise.

## 2014-08-24 ENCOUNTER — Ambulatory Visit: Payer: Self-pay | Admitting: Family Medicine

## 2014-08-25 ENCOUNTER — Other Ambulatory Visit: Payer: Self-pay | Admitting: Family Medicine

## 2014-09-03 ENCOUNTER — Telehealth: Payer: Self-pay | Admitting: Family Medicine

## 2014-09-03 DIAGNOSIS — IMO0002 Reserved for concepts with insufficient information to code with codable children: Secondary | ICD-10-CM

## 2014-09-03 DIAGNOSIS — E1165 Type 2 diabetes mellitus with hyperglycemia: Secondary | ICD-10-CM

## 2014-09-03 MED ORDER — INSULIN NPH ISOPHANE & REGULAR (70-30) 100 UNIT/ML ~~LOC~~ SUSP: SUBCUTANEOUS | Status: DC

## 2014-09-03 NOTE — Telephone Encounter (Signed)
Patient would like his insulin refilled until his next appt. He takes insulin NPH-regular Human (NOVOLIN 70/30). Thank you, [name], ASA

## 2014-09-05 ENCOUNTER — Telehealth: Payer: Self-pay | Admitting: Family Medicine

## 2014-09-05 NOTE — Telephone Encounter (Signed)
We do not have any novalin 70/30 samples.  Will forward to MD to see if any samples we have can be given. Rodney [name], Rodney [name]

## 2014-09-05 NOTE — Telephone Encounter (Signed)
Rodney [name] called because Rodney [name] is out of his insulin and there is prescription at the pharmacy but they can not pick this up since they have no money until next week. Do we have any sample that he have until then. Please call Rodney [name] and let her know. jw

## 2014-09-05 NOTE — Telephone Encounter (Signed)
I don't know that the orange card will help him afford his insulin. Speaking with pharmacy might be a better option to determine other options.

## 2014-09-05 NOTE — Telephone Encounter (Signed)
Will defer to our Education officer, museum and eligibility specialist.

## 2014-09-06 NOTE — Telephone Encounter (Signed)
Called and left message that Linda should schedule an appointment with Pamala Hurry to see if he qualifies for the orange card program.

## 2014-09-07 ENCOUNTER — Ambulatory Visit: Payer: Self-pay | Admitting: Family Medicine

## 2014-09-12 ENCOUNTER — Ambulatory Visit (INDEPENDENT_AMBULATORY_CARE_PROVIDER_SITE_OTHER): Payer: No Typology Code available for payment source | Admitting: Family Medicine

## 2014-09-12 ENCOUNTER — Encounter: Payer: Self-pay | Admitting: Family Medicine

## 2014-09-12 VITALS — BP 147/72 | HR 99 | Temp 98.5°F | Ht 70.0 in | Wt 255.2 lb

## 2014-09-12 DIAGNOSIS — E1342 Other specified diabetes mellitus with diabetic polyneuropathy: Secondary | ICD-10-CM

## 2014-09-12 DIAGNOSIS — E1165 Type 2 diabetes mellitus with hyperglycemia: Secondary | ICD-10-CM | POA: Diagnosis not present

## 2014-09-12 DIAGNOSIS — IMO0002 Reserved for concepts with insufficient information to code with codable children: Secondary | ICD-10-CM

## 2014-09-12 DIAGNOSIS — G629 Polyneuropathy, unspecified: Secondary | ICD-10-CM

## 2014-09-12 DIAGNOSIS — E1142 Type 2 diabetes mellitus with diabetic polyneuropathy: Secondary | ICD-10-CM

## 2014-09-12 DIAGNOSIS — E7800 Pure hypercholesterolemia, unspecified: Secondary | ICD-10-CM

## 2014-09-12 DIAGNOSIS — Z720 Tobacco use: Secondary | ICD-10-CM

## 2014-09-12 DIAGNOSIS — Z23 Encounter for immunization: Secondary | ICD-10-CM

## 2014-09-12 DIAGNOSIS — I10 Essential (primary) hypertension: Secondary | ICD-10-CM

## 2014-09-12 DIAGNOSIS — Z114 Encounter for screening for human immunodeficiency virus [HIV]: Secondary | ICD-10-CM | POA: Insufficient documentation

## 2014-09-12 LAB — BASIC METABOLIC PANEL
BUN: 21 mg/dL (ref 6–23)
CHLORIDE: 97 meq/L (ref 96–112)
CO2: 28 mEq/L (ref 19–32)
Calcium: 10 mg/dL (ref 8.4–10.5)
Creat: 0.99 mg/dL (ref 0.50–1.35)
Glucose, Bld: 308 mg/dL — ABNORMAL HIGH (ref 70–99)
POTASSIUM: 4.8 meq/L (ref 3.5–5.3)
Sodium: 133 mEq/L — ABNORMAL LOW (ref 135–145)

## 2014-09-12 LAB — POCT GLYCOSYLATED HEMOGLOBIN (HGB A1C): HEMOGLOBIN A1C: 11.2

## 2014-09-12 MED ORDER — ZOSTER VACCINE LIVE 19400 UNT/0.65ML ~~LOC~~ SOLR: 0.6500 mL | Freq: Once | SUBCUTANEOUS | Status: DC

## 2014-09-12 MED ORDER — ATORVASTATIN CALCIUM 40 MG PO TABS: 40.0000 mg | ORAL_TABLET | Freq: Every day | ORAL | Status: DC

## 2014-09-12 MED ORDER — METFORMIN HCL 1000 MG PO TABS
ORAL_TABLET | ORAL | Status: DC
Start: 2014-09-12 — End: 2015-09-27

## 2014-09-12 MED ORDER — INSULIN NPH ISOPHANE & REGULAR (70-30) 100 UNIT/ML ~~LOC~~ SUSP: SUBCUTANEOUS | Status: DC

## 2014-09-12 MED ORDER — LISINOPRIL-HYDROCHLOROTHIAZIDE 20-25 MG PO TABS: 1.0000 | ORAL_TABLET | Freq: Every day | ORAL | Status: DC

## 2014-09-12 NOTE — Patient Instructions (Signed)
To Vladimir Faster, I sent 1. Lisinopril HCTZ replaces accuretic 2. Atorvastatin a cholesterol medication 3. 70/30 insulin 4. Shingles vaccine. Will be too expensive if insurance does not cover.  Let me know if you get it so I can update my records.  To Kristopher Oppenheim, I sent the metformin.  I will get a little blood work today and call you.  See me in three months for more tests after you have been on your meds.  We are giving you a pneumonia shot Pneumovax.

## 2014-09-13 DIAGNOSIS — E1142 Type 2 diabetes mellitus with diabetic polyneuropathy: Secondary | ICD-10-CM | POA: Insufficient documentation

## 2014-09-13 LAB — HIV ANTIBODY (ROUTINE TESTING W REFLEX): HIV: NONREACTIVE

## 2014-09-13 NOTE — Assessment & Plan Note (Signed)
Instructed in foot care.

## 2014-09-13 NOTE — Progress Notes (Signed)
   Subjective:    Patient ID: Rodney [name], male    DOB: 01/15/1951, 64 y.o.   MRN: [id number]  HPI   Rodney [name] has done his usual thing.  To save money, he has waited over one year to recheck his problems.  He only came in when I would not refill his meds.  And he has now been out of meds for at least two weeks.  His numbers are all high.  On the good side, he is asymptomatic (denies chest pain, SOB, ankle swelling bowel or bladder changes.)  His only symptom is that he has begun to notice some tingling in his feet.    On the good side, he now has insurance, so he is able to afford to come in more often. He has been off crestor for 10 months due to cost.   Review of Systems     Objective:   Physical Exam  Lungs clear Cardiac RRR without m or g Abd benign Ext no edema.  See diabetic foot exam.        Assessment & Plan:

## 2014-09-13 NOTE — Assessment & Plan Note (Signed)
Still smoking but has cut back to 1/3 ppd.  Encouraged to quit.

## 2014-09-13 NOTE — Assessment & Plan Note (Signed)
Poor control.  Restart insulin.

## 2014-09-13 NOTE — Assessment & Plan Note (Signed)
No sense in screening today.  I know he needs statin.  Start atorvastatin and recheck in 3 months.

## 2014-09-13 NOTE — Assessment & Plan Note (Signed)
Screen.  No risk factors.

## 2014-09-13 NOTE — Assessment & Plan Note (Signed)
Poor control.  Restart meds

## 2014-11-30 ENCOUNTER — Telehealth: Payer: Self-pay | Admitting: Family Medicine

## 2014-11-30 NOTE — Telephone Encounter (Signed)
error 

## 2015-07-10 ENCOUNTER — Other Ambulatory Visit: Payer: Self-pay | Admitting: Family Medicine

## 2015-09-27 ENCOUNTER — Other Ambulatory Visit: Payer: Self-pay | Admitting: Family Medicine

## 2015-10-09 ENCOUNTER — Ambulatory Visit (INDEPENDENT_AMBULATORY_CARE_PROVIDER_SITE_OTHER): Payer: BLUE CROSS/BLUE SHIELD | Admitting: Family Medicine

## 2015-10-09 ENCOUNTER — Encounter: Payer: Self-pay | Admitting: Family Medicine

## 2015-10-09 VITALS — BP 122/58 | HR 93 | Temp 98.1°F | Ht 70.0 in | Wt 251.6 lb

## 2015-10-09 DIAGNOSIS — Z1211 Encounter for screening for malignant neoplasm of colon: Secondary | ICD-10-CM

## 2015-10-09 DIAGNOSIS — E118 Type 2 diabetes mellitus with unspecified complications: Secondary | ICD-10-CM | POA: Diagnosis not present

## 2015-10-09 DIAGNOSIS — IMO0002 Reserved for concepts with insufficient information to code with codable children: Secondary | ICD-10-CM

## 2015-10-09 DIAGNOSIS — E7800 Pure hypercholesterolemia, unspecified: Secondary | ICD-10-CM

## 2015-10-09 DIAGNOSIS — E1140 Type 2 diabetes mellitus with diabetic neuropathy, unspecified: Secondary | ICD-10-CM

## 2015-10-09 DIAGNOSIS — E1165 Type 2 diabetes mellitus with hyperglycemia: Secondary | ICD-10-CM

## 2015-10-09 DIAGNOSIS — E669 Obesity, unspecified: Secondary | ICD-10-CM | POA: Insufficient documentation

## 2015-10-09 DIAGNOSIS — I10 Essential (primary) hypertension: Secondary | ICD-10-CM

## 2015-10-09 DIAGNOSIS — Z794 Long term (current) use of insulin: Secondary | ICD-10-CM

## 2015-10-09 DIAGNOSIS — Z7251 High risk heterosexual behavior: Secondary | ICD-10-CM

## 2015-10-09 DIAGNOSIS — IMO0001 Reserved for inherently not codable concepts without codable children: Secondary | ICD-10-CM

## 2015-10-09 DIAGNOSIS — Z720 Tobacco use: Secondary | ICD-10-CM

## 2015-10-09 LAB — POCT GLYCOSYLATED HEMOGLOBIN (HGB A1C): HEMOGLOBIN A1C: 13.1

## 2015-10-09 MED ORDER — ATORVASTATIN CALCIUM 40 MG PO TABS: 40.0000 mg | ORAL_TABLET | Freq: Every day | ORAL | Status: DC

## 2015-10-09 MED ORDER — INSULIN NPH ISOPHANE & REGULAR (70-30) 100 UNIT/ML ~~LOC~~ SUSP: SUBCUTANEOUS | Status: DC

## 2015-10-09 MED ORDER — LISINOPRIL-HYDROCHLOROTHIAZIDE 20-25 MG PO TABS: 1.0000 | ORAL_TABLET | Freq: Every day | ORAL | Status: DC

## 2015-10-09 MED ORDER — METFORMIN HCL 1000 MG PO TABS: 1000.0000 mg | ORAL_TABLET | Freq: Two times a day (BID) | ORAL | Status: DC

## 2015-10-09 NOTE — Assessment & Plan Note (Signed)
Advised to quit again.

## 2015-10-09 NOTE — Progress Notes (Signed)
   Subjective:    Patient ID: Rodney [name], male    DOB: 10/11/1950, 65 y.o.   MRN: [id number]  HPI  It has been one year since last visit and Rodney [name] is way behind on chronic disease management and HPDP. 1. HBP.  On meds.  Fortunately, good control 2. DM.  POCT A1C indicates terrible control.  Taking 70/30 and metformen.  Does have insurance, so no barrier. 3. Hypercholesterolemia.  Due for lipid panel.  Taking atorvastatin.   4. Smoker "I am trying to quit." He has been trying for years. HPDP On ASA for cardiac risk reduction. Wt stable Due for eye exam Willing to have colonoscopy. Due for foot exam States had pneumovax last year. States he never had chicken pox so he does not want/refuses zostavax. Never hep c screened. He fits criteria for lung ca screen    Review of Systems Cough and long smoking history.  Likely has early COPD.     Objective:   Physical Exam lungs clear Cardiac RRR without m or g Abd, central obesity. Ext no edema.        Assessment & Plan:

## 2015-10-09 NOTE — Assessment & Plan Note (Signed)
Advised wt loss.

## 2015-10-09 NOTE — Assessment & Plan Note (Signed)
Very poor control.

## 2015-10-09 NOTE — Assessment & Plan Note (Signed)
Refer for colonoscopy 

## 2015-10-09 NOTE — Patient Instructions (Addendum)
The things that you need in order of importance 1. We need to get better control of your diabetes. 2. You really need to quit smoking.  3. You need to see an eye doctor.  Diabetes is the leading cause of blindness in [place].  Have them send me a copy of the report.   4. You need to get a colonoscopy.   5. See me in three months.  We need to get the diabetes under control  If you get all this done, we can talk about lung cancer screening.

## 2015-10-10 LAB — COMPLETE METABOLIC PANEL WITH GFR
ALBUMIN: 3.8 g/dL (ref 3.6–5.1)
ALT: 17 U/L (ref 9–46)
AST: 13 U/L (ref 10–35)
Alkaline Phosphatase: 69 U/L (ref 40–115)
BILIRUBIN TOTAL: 0.3 mg/dL (ref 0.2–1.2)
BUN: 21 mg/dL (ref 7–25)
CALCIUM: 9.7 mg/dL (ref 8.6–10.3)
CO2: 21 mmol/L (ref 20–31)
CREATININE: 1.19 mg/dL (ref 0.70–1.25)
Chloride: 95 mmol/L — ABNORMAL LOW (ref 98–110)
GFR, EST AFRICAN AMERICAN: 75 mL/min (ref >=60)
GFR, EST NON AFRICAN AMERICAN: 65 mL/min (ref >=60)
Glucose, Bld: 391 mg/dL — ABNORMAL HIGH (ref 65–99)
Potassium: 5 mmol/L (ref 3.5–5.3)
SODIUM: 134 mmol/L — AB (ref 135–146)
TOTAL PROTEIN: 7.2 g/dL (ref 6.1–8.1)

## 2015-10-10 LAB — LIPID PANEL
CHOLESTEROL: 164 mg/dL (ref 125–200)
HDL: 32 mg/dL — AB (ref >=40)
LDL CALC: 70 mg/dL (ref <130)
TRIGLYCERIDES: 312 mg/dL — AB (ref <150)
Total CHOL/HDL Ratio: 5.1 Ratio — ABNORMAL HIGH (ref <=5.0)
VLDL: 62 mg/dL — ABNORMAL HIGH (ref <30)

## 2015-10-10 LAB — HEPATITIS C ANTIBODY: HCV Ab: NEGATIVE

## 2015-10-11 ENCOUNTER — Encounter: Payer: Self-pay | Admitting: Gastroenterology

## 2015-11-28 ENCOUNTER — Other Ambulatory Visit: Payer: Self-pay | Admitting: Family Medicine

## 2015-11-28 DIAGNOSIS — Z794 Long term (current) use of insulin: Principal | ICD-10-CM

## 2015-11-28 DIAGNOSIS — E1165 Type 2 diabetes mellitus with hyperglycemia: Secondary | ICD-10-CM

## 2015-11-28 DIAGNOSIS — E118 Type 2 diabetes mellitus with unspecified complications: Principal | ICD-10-CM

## 2015-11-28 DIAGNOSIS — E1140 Type 2 diabetes mellitus with diabetic neuropathy, unspecified: Secondary | ICD-10-CM

## 2015-11-28 DIAGNOSIS — IMO0002 Reserved for concepts with insufficient information to code with codable children: Secondary | ICD-10-CM

## 2015-12-11 ENCOUNTER — Encounter: Payer: BLUE CROSS/BLUE SHIELD | Admitting: Gastroenterology

## 2015-12-12 ENCOUNTER — Ambulatory Visit: Payer: BLUE CROSS/BLUE SHIELD | Admitting: Family Medicine

## 2015-12-18 ENCOUNTER — Ambulatory Visit (INDEPENDENT_AMBULATORY_CARE_PROVIDER_SITE_OTHER): Payer: BLUE CROSS/BLUE SHIELD | Admitting: Family Medicine

## 2015-12-18 ENCOUNTER — Encounter: Payer: Self-pay | Admitting: Family Medicine

## 2015-12-18 DIAGNOSIS — E1165 Type 2 diabetes mellitus with hyperglycemia: Secondary | ICD-10-CM | POA: Diagnosis not present

## 2015-12-18 DIAGNOSIS — E1140 Type 2 diabetes mellitus with diabetic neuropathy, unspecified: Secondary | ICD-10-CM

## 2015-12-18 DIAGNOSIS — Z23 Encounter for immunization: Secondary | ICD-10-CM | POA: Diagnosis not present

## 2015-12-18 DIAGNOSIS — IMO0002 Reserved for concepts with insufficient information to code with codable children: Secondary | ICD-10-CM

## 2015-12-18 DIAGNOSIS — E118 Type 2 diabetes mellitus with unspecified complications: Secondary | ICD-10-CM | POA: Diagnosis not present

## 2015-12-18 DIAGNOSIS — K429 Umbilical hernia without obstruction or gangrene: Secondary | ICD-10-CM | POA: Insufficient documentation

## 2015-12-18 DIAGNOSIS — Z794 Long term (current) use of insulin: Secondary | ICD-10-CM | POA: Diagnosis not present

## 2015-12-18 DIAGNOSIS — Z720 Tobacco use: Secondary | ICD-10-CM

## 2015-12-18 DIAGNOSIS — D485 Neoplasm of uncertain behavior of skin: Secondary | ICD-10-CM | POA: Diagnosis not present

## 2015-12-18 LAB — POCT GLYCOSYLATED HEMOGLOBIN (HGB A1C): Hemoglobin A1C: 11.2

## 2015-12-18 MED ORDER — INSULIN NPH ISOPHANE & REGULAR (70-30) 100 UNIT/ML ~~LOC~~ SUSP
SUBCUTANEOUS | 3 refills | Status: DC
Start: 2015-12-18 — End: 2016-04-07

## 2015-12-18 NOTE — Progress Notes (Signed)
   Subjective:    Patient ID: Rodney [name], male    DOB: 07-24-1950, 65 y.o.   MRN: [id number]  HPI Multiple issues: 1. Cold x 2 weeks.  Lingering cough.  No fever.  Improving. 2. Still smoking - less with cough and cold 3. Has not had colonoscopy or ophth appointments. 4. Not following diet or exercise.  5. Growing lesion on face (left cheek)  Present for years.  Recent growth. 6. DM, A1C improved but still terrible control   7. hernia     Review of Systems     Objective:   Physical Exam Note 4 lb wt gain BP good Lungs clear.  No rales or wheeze. 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  Not fixed to underlying tissue. Abd reducable umbilical hernia.        Assessment & Plan:

## 2015-12-18 NOTE — Assessment & Plan Note (Signed)
Quit smoking prior to any general surgery.

## 2015-12-18 NOTE — Assessment & Plan Note (Addendum)
Better but still poor control. Increase insulin

## 2015-12-18 NOTE — Progress Notes (Signed)
   Subjective:    Patient ID: Rodney [name], male    DOB: [birth year], 65 y.o.   MRN: [id number]  HPI    Review of Systems     Objective:   Physical Exam        Assessment & Plan:

## 2015-12-18 NOTE — Assessment & Plan Note (Addendum)
Big enough, will ask surg to remove. Likely a basal cell

## 2015-12-18 NOTE — Assessment & Plan Note (Signed)
Surgical referral

## 2015-12-18 NOTE — Patient Instructions (Addendum)
See the general surgeon.  Someone should call about appointment.  Ask about both repairing your hernia and removing the spot on your face. Increase your insulin dose to 55 units and 35 units.  Keep working on [employer]. Quit smoking.  Especially important if you are going to have surgery. You got a flu shot today. See me in 3 months.   Please also reschedule your colonoscopy. You also need to see the eye doctor because of the diabetes

## 2016-04-07 ENCOUNTER — Other Ambulatory Visit: Payer: Self-pay | Admitting: Family Medicine

## 2016-04-07 DIAGNOSIS — E118 Type 2 diabetes mellitus with unspecified complications: Secondary | ICD-10-CM

## 2016-04-07 DIAGNOSIS — IMO0002 Reserved for concepts with insufficient information to code with codable children: Secondary | ICD-10-CM

## 2016-04-07 DIAGNOSIS — E1165 Type 2 diabetes mellitus with hyperglycemia: Principal | ICD-10-CM

## 2016-04-07 DIAGNOSIS — Z794 Long term (current) use of insulin: Principal | ICD-10-CM

## 2016-04-07 DIAGNOSIS — E1140 Type 2 diabetes mellitus with diabetic neuropathy, unspecified: Secondary | ICD-10-CM

## 2016-04-07 NOTE — Telephone Encounter (Signed)
Pt needs a refill on insulin 70/30. Please advise. Thanks! ep

## 2016-10-14 ENCOUNTER — Other Ambulatory Visit: Payer: Self-pay | Admitting: Family Medicine

## 2016-10-14 DIAGNOSIS — E1165 Type 2 diabetes mellitus with hyperglycemia: Secondary | ICD-10-CM

## 2016-10-14 DIAGNOSIS — I10 Essential (primary) hypertension: Secondary | ICD-10-CM

## 2016-10-14 DIAGNOSIS — E118 Type 2 diabetes mellitus with unspecified complications: Principal | ICD-10-CM

## 2016-10-14 DIAGNOSIS — IMO0002 Reserved for concepts with insufficient information to code with codable children: Secondary | ICD-10-CM

## 2016-10-14 DIAGNOSIS — Z794 Long term (current) use of insulin: Principal | ICD-10-CM

## 2016-10-14 DIAGNOSIS — E1140 Type 2 diabetes mellitus with diabetic neuropathy, unspecified: Secondary | ICD-10-CM

## 2016-10-27 ENCOUNTER — Other Ambulatory Visit: Payer: Self-pay | Admitting: Family Medicine

## 2016-10-27 DIAGNOSIS — E118 Type 2 diabetes mellitus with unspecified complications: Principal | ICD-10-CM

## 2016-10-27 DIAGNOSIS — IMO0002 Reserved for concepts with insufficient information to code with codable children: Secondary | ICD-10-CM

## 2016-10-27 DIAGNOSIS — Z794 Long term (current) use of insulin: Principal | ICD-10-CM

## 2016-10-27 DIAGNOSIS — E1140 Type 2 diabetes mellitus with diabetic neuropathy, unspecified: Secondary | ICD-10-CM

## 2016-10-27 DIAGNOSIS — E1165 Type 2 diabetes mellitus with hyperglycemia: Secondary | ICD-10-CM

## 2017-01-12 ENCOUNTER — Other Ambulatory Visit: Payer: Self-pay | Admitting: Family Medicine

## 2017-01-12 DIAGNOSIS — IMO0002 Reserved for concepts with insufficient information to code with codable children: Secondary | ICD-10-CM

## 2017-01-12 DIAGNOSIS — Z794 Long term (current) use of insulin: Principal | ICD-10-CM

## 2017-01-12 DIAGNOSIS — E1140 Type 2 diabetes mellitus with diabetic neuropathy, unspecified: Secondary | ICD-10-CM

## 2017-01-12 DIAGNOSIS — E118 Type 2 diabetes mellitus with unspecified complications: Principal | ICD-10-CM

## 2017-01-12 DIAGNOSIS — E1165 Type 2 diabetes mellitus with hyperglycemia: Secondary | ICD-10-CM

## 2017-03-25 ENCOUNTER — Ambulatory Visit (INDEPENDENT_AMBULATORY_CARE_PROVIDER_SITE_OTHER): Payer: Medicare PPO | Admitting: Family Medicine

## 2017-03-25 ENCOUNTER — Other Ambulatory Visit: Payer: Self-pay

## 2017-03-25 ENCOUNTER — Encounter: Payer: Self-pay | Admitting: Family Medicine

## 2017-03-25 VITALS — BP 128/60 | HR 90 | Temp 97.9°F | Ht 72.0 in | Wt 255.0 lb

## 2017-03-25 DIAGNOSIS — E1149 Type 2 diabetes mellitus with other diabetic neurological complication: Secondary | ICD-10-CM | POA: Diagnosis not present

## 2017-03-25 DIAGNOSIS — Z23 Encounter for immunization: Secondary | ICD-10-CM

## 2017-03-25 DIAGNOSIS — I10 Essential (primary) hypertension: Secondary | ICD-10-CM

## 2017-03-25 DIAGNOSIS — Z794 Long term (current) use of insulin: Secondary | ICD-10-CM

## 2017-03-25 DIAGNOSIS — E1165 Type 2 diabetes mellitus with hyperglycemia: Secondary | ICD-10-CM

## 2017-03-25 DIAGNOSIS — IMO0002 Reserved for concepts with insufficient information to code with codable children: Secondary | ICD-10-CM

## 2017-03-25 DIAGNOSIS — E7800 Pure hypercholesterolemia, unspecified: Secondary | ICD-10-CM | POA: Diagnosis not present

## 2017-03-25 DIAGNOSIS — E118 Type 2 diabetes mellitus with unspecified complications: Secondary | ICD-10-CM | POA: Diagnosis not present

## 2017-03-25 DIAGNOSIS — E1140 Type 2 diabetes mellitus with diabetic neuropathy, unspecified: Secondary | ICD-10-CM | POA: Diagnosis not present

## 2017-03-25 LAB — POCT GLYCOSYLATED HEMOGLOBIN (HGB A1C): Hemoglobin A1C: 13.4

## 2017-03-25 MED ORDER — ATORVASTATIN CALCIUM 40 MG PO TABS: ORAL_TABLET | ORAL | 3 refills | Status: DC

## 2017-03-25 MED ORDER — LISINOPRIL-HYDROCHLOROTHIAZIDE 20-25 MG PO TABS: 1.0000 | ORAL_TABLET | Freq: Every day | ORAL | 3 refills | Status: DC

## 2017-03-25 MED ORDER — METFORMIN HCL 1000 MG PO TABS: 1000.0000 mg | ORAL_TABLET | Freq: Two times a day (BID) | ORAL | 3 refills | Status: DC

## 2017-03-25 MED ORDER — INSULIN NPH ISOPHANE & REGULAR (70-30) 100 UNIT/ML ~~LOC~~ SUSP: SUBCUTANEOUS | 3 refills | Status: DC

## 2017-03-25 NOTE — Assessment & Plan Note (Signed)
Bump insulin dose.  Strongly emphasized need for eye exam.

## 2017-03-25 NOTE — Assessment & Plan Note (Signed)
Restart statin and check lipids next visit.

## 2017-03-25 NOTE — Patient Instructions (Addendum)
You look pretty good. I think things are OK except for your diabetes which is terrible. Increase your insulin to 60 units every morning and 40 units in the evening. I sent in a referral for the eye doctor.  You need your eyes checked every year. I ordered refills on your medications.   I only ordered a little blood work today.  I will check your cholesterol next visit. See me in three months.  We need to get this diabetes under better control.

## 2017-03-25 NOTE — Progress Notes (Signed)
   Subjective:    Patient ID: Rodney [name], male    DOB: [birth year], 66 y.o.   MRN: [id number]  HPI Rodney [name] finally comes in after I would not renew his meds.  Issues: 1. Hypertension.  Good control.  No problems with current meds. 2. Hyperlipidemia.  Uncertain control.  Not fasting.  Has been off statin for 3 weeks due to Rx being out.  Will not measure this visit. 3.  Diabetes.  Terrible control.  States taking insulin and metformen.  Needs foot and eye exam.  Has bilateral neuropathy symptoms.   4. Needs flu shot.      Review of Systems     Objective:   Physical Exam Lungs clear Cardiac RRR without m or g abd benign Diabetic foot exam done and confirms neuropathy.        Assessment & Plan:

## 2017-03-25 NOTE — Addendum Note (Signed)
Addended by: Londell Moh T on: 03/25/2017 05:10 PM   Modules accepted: Orders, SmartSet

## 2017-03-25 NOTE — Assessment & Plan Note (Signed)
Good control.  Refill current meds.

## 2017-03-26 LAB — BASIC METABOLIC PANEL
BUN / CREAT RATIO: 16 (ref 10–24)
BUN: 19 mg/dL (ref 8–27)
CHLORIDE: 95 mmol/L — AB (ref 96–106)
CO2: 22 mmol/L (ref 20–29)
Calcium: 9.5 mg/dL (ref 8.6–10.2)
Creatinine, Ser: 1.16 mg/dL (ref 0.76–1.27)
GFR calc Af Amer: 76 mL/min/{1.73_m2} (ref >59)
GFR calc non Af Amer: 66 mL/min/{1.73_m2} (ref >59)
GLUCOSE: 359 mg/dL — AB (ref 65–99)
Potassium: 4.7 mmol/L (ref 3.5–5.2)
SODIUM: 132 mmol/L — AB (ref 134–144)

## 2017-04-12 ENCOUNTER — Telehealth: Payer: Self-pay | Admitting: *Deleted

## 2017-04-12 MED ORDER — GLUCOSE BLOOD VI STRP: ORAL_STRIP | 3 refills | Status: DC

## 2017-04-12 MED ORDER — ONETOUCH VERIO W/DEVICE KIT: 1.0000 | PACK | Freq: Three times a day (TID) | 0 refills | Status: DC

## 2017-04-12 NOTE — Telephone Encounter (Signed)
Done

## 2017-04-12 NOTE — Telephone Encounter (Signed)
Patient's SO left message on nurse line requesting new glucometer and test strips be sent to [facility]. Hubbard Hartshorn, RN, BSN

## 2017-04-14 MED ORDER — ACCU-CHEK AVIVA DEVI: 0 refills | Status: AC

## 2017-04-14 MED ORDER — GLUCOSE BLOOD VI STRP: ORAL_STRIP | 3 refills | Status: DC

## 2017-04-14 NOTE — Addendum Note (Signed)
Addended by: [name] on: 04/14/2017 04:58 PM   Modules accepted: Orders

## 2017-04-14 NOTE — Telephone Encounter (Signed)
Received fax from [facility] that One Touch Verio glucometer and strips have been rejected for reimbursement. [employer] will pay for Accu-chek Aviva glucometer and strips. Hubbard Hartshorn, RN, BSN

## 2017-04-14 NOTE — Telephone Encounter (Signed)
If at first you don't succeed.Marland KitchenMarland Kitchen

## 2017-06-10 DIAGNOSIS — H2513 Age-related nuclear cataract, bilateral: Secondary | ICD-10-CM | POA: Diagnosis not present

## 2017-06-10 DIAGNOSIS — Z7984 Long term (current) use of oral hypoglycemic drugs: Secondary | ICD-10-CM | POA: Diagnosis not present

## 2017-06-10 DIAGNOSIS — Z794 Long term (current) use of insulin: Secondary | ICD-10-CM | POA: Diagnosis not present

## 2017-06-10 DIAGNOSIS — H524 Presbyopia: Secondary | ICD-10-CM | POA: Diagnosis not present

## 2017-06-10 DIAGNOSIS — E119 Type 2 diabetes mellitus without complications: Secondary | ICD-10-CM | POA: Diagnosis not present

## 2017-10-06 ENCOUNTER — Ambulatory Visit: Payer: Medicare PPO | Admitting: Family Medicine

## 2017-10-12 ENCOUNTER — Other Ambulatory Visit: Payer: Self-pay

## 2017-10-12 DIAGNOSIS — E1140 Type 2 diabetes mellitus with diabetic neuropathy, unspecified: Secondary | ICD-10-CM

## 2017-10-12 DIAGNOSIS — Z794 Long term (current) use of insulin: Principal | ICD-10-CM

## 2017-10-12 DIAGNOSIS — E1165 Type 2 diabetes mellitus with hyperglycemia: Secondary | ICD-10-CM

## 2017-10-12 DIAGNOSIS — I10 Essential (primary) hypertension: Secondary | ICD-10-CM

## 2017-10-12 DIAGNOSIS — E118 Type 2 diabetes mellitus with unspecified complications: Principal | ICD-10-CM

## 2017-10-12 DIAGNOSIS — IMO0002 Reserved for concepts with insufficient information to code with codable children: Secondary | ICD-10-CM

## 2017-10-12 NOTE — Telephone Encounter (Signed)
Called and left message.  Once again he is well overdue for a visit.  What is the lesser of two evils?  Refill meds without being seen or refuse refill and have him go without.  I hope we can agree that I will refill his meds as long as he makes and keeps an appointment.

## 2017-10-13 MED ORDER — ATORVASTATIN CALCIUM 40 MG PO TABS: ORAL_TABLET | ORAL | 0 refills | Status: DC

## 2017-10-13 MED ORDER — LISINOPRIL-HYDROCHLOROTHIAZIDE 20-25 MG PO TABS: 1.0000 | ORAL_TABLET | Freq: Every day | ORAL | 0 refills | Status: DC

## 2017-10-13 MED ORDER — METFORMIN HCL 1000 MG PO TABS: 1000.0000 mg | ORAL_TABLET | Freq: Two times a day (BID) | ORAL | 0 refills | Status: DC

## 2017-10-13 NOTE — Telephone Encounter (Signed)
Still phone goes to voicemail.  LM that I authorized refill - but last one until seen.

## 2017-10-20 ENCOUNTER — Other Ambulatory Visit: Payer: Self-pay | Admitting: Family Medicine

## 2017-10-20 DIAGNOSIS — IMO0002 Reserved for concepts with insufficient information to code with codable children: Secondary | ICD-10-CM

## 2017-10-20 DIAGNOSIS — E1140 Type 2 diabetes mellitus with diabetic neuropathy, unspecified: Secondary | ICD-10-CM

## 2017-10-20 DIAGNOSIS — Z794 Long term (current) use of insulin: Principal | ICD-10-CM

## 2017-10-20 DIAGNOSIS — E1165 Type 2 diabetes mellitus with hyperglycemia: Principal | ICD-10-CM

## 2017-10-20 DIAGNOSIS — E118 Type 2 diabetes mellitus with unspecified complications: Secondary | ICD-10-CM

## 2017-10-20 NOTE — Telephone Encounter (Signed)
Pt's mail order has delayed getting him the cholesterol medication. He will not have this until July 4th and they wanted to make sure that is ok.    Wife called [facility] and had them send [place] a request, but I advised that insurance will likely not cover at both places.   Advised that it was likely ok if he misses a few days of cholesterol meds but I would double check with PCP. Seymone Forlenza, Salome Spotted, CMA

## 2017-10-20 NOTE — Telephone Encounter (Signed)
Noted and agree.  The problem is that I have needed to stop refills to force the patient to come in.  Lack of timely followup visits is resulting in sub optimal care.

## 2017-12-20 ENCOUNTER — Other Ambulatory Visit: Payer: Self-pay | Admitting: Family Medicine

## 2017-12-20 DIAGNOSIS — E1165 Type 2 diabetes mellitus with hyperglycemia: Secondary | ICD-10-CM

## 2017-12-20 DIAGNOSIS — E118 Type 2 diabetes mellitus with unspecified complications: Secondary | ICD-10-CM

## 2017-12-20 DIAGNOSIS — Z794 Long term (current) use of insulin: Secondary | ICD-10-CM

## 2017-12-20 DIAGNOSIS — I10 Essential (primary) hypertension: Secondary | ICD-10-CM

## 2017-12-20 DIAGNOSIS — E1140 Type 2 diabetes mellitus with diabetic neuropathy, unspecified: Secondary | ICD-10-CM

## 2017-12-20 DIAGNOSIS — IMO0002 Reserved for concepts with insufficient information to code with codable children: Secondary | ICD-10-CM

## 2018-02-16 ENCOUNTER — Ambulatory Visit: Payer: Medicare PPO | Admitting: Family Medicine

## 2018-02-21 ENCOUNTER — Encounter: Payer: Self-pay | Admitting: Family Medicine

## 2018-02-21 ENCOUNTER — Ambulatory Visit: Payer: Medicare PPO | Admitting: Family Medicine

## 2018-02-21 ENCOUNTER — Other Ambulatory Visit: Payer: Self-pay

## 2018-02-21 VITALS — BP 128/60 | HR 91 | Temp 98.4°F | Ht 72.0 in | Wt 257.4 lb

## 2018-02-21 DIAGNOSIS — E1149 Type 2 diabetes mellitus with other diabetic neurological complication: Secondary | ICD-10-CM

## 2018-02-21 DIAGNOSIS — Z720 Tobacco use: Secondary | ICD-10-CM

## 2018-02-21 DIAGNOSIS — E669 Obesity, unspecified: Secondary | ICD-10-CM

## 2018-02-21 DIAGNOSIS — E118 Type 2 diabetes mellitus with unspecified complications: Secondary | ICD-10-CM

## 2018-02-21 DIAGNOSIS — L03032 Cellulitis of left toe: Secondary | ICD-10-CM | POA: Diagnosis not present

## 2018-02-21 DIAGNOSIS — Z794 Long term (current) use of insulin: Secondary | ICD-10-CM

## 2018-02-21 DIAGNOSIS — IMO0002 Reserved for concepts with insufficient information to code with codable children: Secondary | ICD-10-CM

## 2018-02-21 DIAGNOSIS — E1165 Type 2 diabetes mellitus with hyperglycemia: Secondary | ICD-10-CM

## 2018-02-21 DIAGNOSIS — L02612 Cutaneous abscess of left foot: Secondary | ICD-10-CM

## 2018-02-21 DIAGNOSIS — E1140 Type 2 diabetes mellitus with diabetic neuropathy, unspecified: Secondary | ICD-10-CM

## 2018-02-21 DIAGNOSIS — Z6834 Body mass index (BMI) 34.0-34.9, adult: Secondary | ICD-10-CM

## 2018-02-21 DIAGNOSIS — I10 Essential (primary) hypertension: Secondary | ICD-10-CM

## 2018-02-21 DIAGNOSIS — E7800 Pure hypercholesterolemia, unspecified: Secondary | ICD-10-CM

## 2018-02-21 DIAGNOSIS — Z23 Encounter for immunization: Secondary | ICD-10-CM

## 2018-02-21 LAB — POCT GLYCOSYLATED HEMOGLOBIN (HGB A1C): HbA1c, POC (controlled diabetic range): 11 % — AB (ref 0.0–7.0)

## 2018-02-21 MED ORDER — CEPHALEXIN 500 MG PO CAPS: 500.0000 mg | ORAL_CAPSULE | Freq: Four times a day (QID) | ORAL | 0 refills | Status: DC

## 2018-02-21 NOTE — Patient Instructions (Addendum)
I sent in an antibiotic for your toe Increase your insulin to 70 units in the morning and 50 units at night. I am about your daughter. I will give you bloodwork results on Thursday.

## 2018-02-22 ENCOUNTER — Encounter: Payer: Self-pay | Admitting: Family Medicine

## 2018-02-22 LAB — LIPID PANEL
Chol/HDL Ratio: 4.2 ratio (ref 0.0–5.0)
Cholesterol, Total: 140 mg/dL (ref 100–199)
HDL: 33 mg/dL — AB (ref >39)
LDL Calculated: 79 mg/dL (ref 0–99)
Triglycerides: 142 mg/dL (ref 0–149)
VLDL Cholesterol Cal: 28 mg/dL (ref 5–40)

## 2018-02-22 LAB — CMP14+EGFR
A/G RATIO: 1.4 (ref 1.2–2.2)
ALT: 15 IU/L (ref 0–44)
AST: 15 IU/L (ref 0–40)
Albumin: 3.9 g/dL (ref 3.6–4.8)
Alkaline Phosphatase: 67 IU/L (ref 39–117)
BILIRUBIN TOTAL: 0.4 mg/dL (ref 0.0–1.2)
BUN / CREAT RATIO: 23 (ref 10–24)
BUN: 26 mg/dL (ref 8–27)
CHLORIDE: 99 mmol/L (ref 96–106)
CO2: 24 mmol/L (ref 20–29)
Calcium: 9.3 mg/dL (ref 8.6–10.2)
Creatinine, Ser: 1.13 mg/dL (ref 0.76–1.27)
GFR calc Af Amer: 78 mL/min/{1.73_m2} (ref >59)
GFR calc non Af Amer: 67 mL/min/{1.73_m2} (ref >59)
Globulin, Total: 2.7 g/dL (ref 1.5–4.5)
Glucose: 151 mg/dL — ABNORMAL HIGH (ref 65–99)
POTASSIUM: 4.9 mmol/L (ref 3.5–5.2)
SODIUM: 134 mmol/L (ref 134–144)
Total Protein: 6.6 g/dL (ref 6.0–8.5)

## 2018-02-22 LAB — CBC
Hematocrit: 38.9 % (ref 37.5–51.0)
Hemoglobin: 13.3 g/dL (ref 13.0–17.7)
MCH: 29.8 pg (ref 26.6–33.0)
MCHC: 34.2 g/dL (ref 31.5–35.7)
MCV: 87 fL (ref 79–97)
PLATELETS: 356 10*3/uL (ref 150–450)
RBC: 4.46 x10E6/uL (ref 4.14–5.80)
RDW: 12.9 % (ref 12.3–15.4)
WBC: 9.2 10*3/uL (ref 3.4–10.8)

## 2018-02-22 MED ORDER — INSULIN NPH ISOPHANE & REGULAR (70-30) 100 UNIT/ML ~~LOC~~ SUSP: SUBCUTANEOUS | 3 refills | Status: DC

## 2018-02-22 NOTE — Assessment & Plan Note (Signed)
Advised once again to quit.

## 2018-02-22 NOTE — Assessment & Plan Note (Signed)
No longer qualifies as morbid obesity since BMI now under 35.  Still needs wt loss.

## 2018-02-22 NOTE — Assessment & Plan Note (Signed)
Start Keflex.  Return in 2-3 days for toenail removal.

## 2018-02-22 NOTE — Assessment & Plan Note (Signed)
Poor control increase insulin

## 2018-02-22 NOTE — Assessment & Plan Note (Signed)
Good control on current meds.  Check labs.

## 2018-02-22 NOTE — Assessment & Plan Note (Signed)
On statin.  Check labs.

## 2018-02-22 NOTE — Progress Notes (Signed)
   Subjective:    Patient ID: Rodney [name], male    DOB: Feb 29, 1951, 67 y.o.   MRN: [id number]  HPI Multiple problems. First visit in 11 months.  Issues 1. HBP - stable on current meds.  No side effects.  Does not take home BP. 2. DM - does not check home blood sugar.  States compliant with insulin and metformin.  Denies hypoglycemic spells.  States eating better.  A1C is "improved" from 13.6 to 11.0.  We still have a ways to go. 3. Left foot - states great toe is infected.  No trauma.  Does have chronic numbness tingling of both feet.  No fever.  States ~2 week duration.  Has been soaking. 4. Still smokes.  Mildly interested in quiting.      Review of Systems     Objective:   Physical Exam Lungs clear Cardiac RRR without m or g Abd benign Ext no edema.  See diabetic foot exam.  Left great toe infected with cellulitis to mid foot.        Assessment & Plan:

## 2018-02-24 ENCOUNTER — Other Ambulatory Visit: Payer: Self-pay

## 2018-02-24 ENCOUNTER — Encounter: Payer: Self-pay | Admitting: Family Medicine

## 2018-02-24 ENCOUNTER — Ambulatory Visit: Payer: Medicare PPO | Admitting: Family Medicine

## 2018-02-24 DIAGNOSIS — R609 Edema, unspecified: Secondary | ICD-10-CM

## 2018-02-24 DIAGNOSIS — L02612 Cutaneous abscess of left foot: Secondary | ICD-10-CM | POA: Diagnosis not present

## 2018-02-24 DIAGNOSIS — L03032 Cellulitis of left toe: Secondary | ICD-10-CM

## 2018-02-24 HISTORY — DX: Edema, unspecified: R60.9

## 2018-02-24 NOTE — Progress Notes (Signed)
   Subjective:    Patient ID: Rodney [name], male    DOB: [birth year], 68 y.o.   MRN: [id number]  HPI  Returns for several issues: 1. Cellulitis of left great toe.  Responding nicely with to two days of keflex. Great toenail is loose and we plan removal. 2. Review of labs.  All good except for A1C.  Tolerating higher dose of insulin well. 3. C/O peripheral edema, bilateral.  We already know he has normal proteins, creat and LFTs.  Never Dxed with CHF.  Denies DOE or orthopnea.      Review of Systems     Objective:   Physical Exam Lungs clear Cardiac RRR without m or g Ext 1+ bilateral edema.R left foot, less redness. Informed consent, digital block.  Toenail removed without difficulty.  No purulent DC.         Assessment & Plan:

## 2018-02-24 NOTE — Assessment & Plan Note (Signed)
Likely venous insufficiency.  CHF also in diff. Low salt and elevate legs.  If continues, check BNP and perhaps echocardiogram

## 2018-02-24 NOTE — Patient Instructions (Addendum)
Feet swelling can come from several things: Kidneys - yours are fine Liver - yours is fine Veins - the most likely cause and also the least worrisome Heart - I don't thing so. Work hard on a low salt diet.  Elevate you legs at least once during the middle of the day.  If the swelling continues, I will need to run another test or two on your hear.  Use antibiotic ointment on your toenail bed.   Finish the antibiotics. Definitely let me know the redness or drainage returns. See me in three months for a recheck of your diabetes.  Sooner if problems

## 2018-02-24 NOTE — Assessment & Plan Note (Signed)
Responding to antibiotics.  Nail removed without difficulty.

## 2018-03-01 ENCOUNTER — Other Ambulatory Visit: Payer: Self-pay | Admitting: Family Medicine

## 2018-03-01 DIAGNOSIS — I10 Essential (primary) hypertension: Secondary | ICD-10-CM

## 2018-03-01 DIAGNOSIS — IMO0002 Reserved for concepts with insufficient information to code with codable children: Secondary | ICD-10-CM

## 2018-03-01 DIAGNOSIS — E118 Type 2 diabetes mellitus with unspecified complications: Secondary | ICD-10-CM

## 2018-03-01 DIAGNOSIS — Z794 Long term (current) use of insulin: Secondary | ICD-10-CM

## 2018-03-01 DIAGNOSIS — E1165 Type 2 diabetes mellitus with hyperglycemia: Secondary | ICD-10-CM

## 2018-03-01 DIAGNOSIS — E1140 Type 2 diabetes mellitus with diabetic neuropathy, unspecified: Secondary | ICD-10-CM

## 2018-06-22 ENCOUNTER — Ambulatory Visit: Payer: Medicare PPO | Admitting: Family Medicine

## 2018-06-22 ENCOUNTER — Encounter: Payer: Self-pay | Admitting: Family Medicine

## 2018-06-22 ENCOUNTER — Other Ambulatory Visit: Payer: Self-pay

## 2018-06-22 VITALS — BP 114/58 | HR 97 | Temp 98.0°F | Ht 72.0 in | Wt 262.0 lb

## 2018-06-22 DIAGNOSIS — Z1211 Encounter for screening for malignant neoplasm of colon: Secondary | ICD-10-CM

## 2018-06-22 DIAGNOSIS — H6693 Otitis media, unspecified, bilateral: Secondary | ICD-10-CM | POA: Diagnosis not present

## 2018-06-22 DIAGNOSIS — E1165 Type 2 diabetes mellitus with hyperglycemia: Secondary | ICD-10-CM

## 2018-06-22 DIAGNOSIS — R609 Edema, unspecified: Secondary | ICD-10-CM | POA: Diagnosis not present

## 2018-06-22 DIAGNOSIS — I10 Essential (primary) hypertension: Secondary | ICD-10-CM

## 2018-06-22 DIAGNOSIS — E1140 Type 2 diabetes mellitus with diabetic neuropathy, unspecified: Secondary | ICD-10-CM | POA: Diagnosis not present

## 2018-06-22 DIAGNOSIS — Z794 Long term (current) use of insulin: Secondary | ICD-10-CM | POA: Diagnosis not present

## 2018-06-22 DIAGNOSIS — IMO0002 Reserved for concepts with insufficient information to code with codable children: Secondary | ICD-10-CM

## 2018-06-22 DIAGNOSIS — R0609 Other forms of dyspnea: Secondary | ICD-10-CM | POA: Diagnosis not present

## 2018-06-22 DIAGNOSIS — I2510 Atherosclerotic heart disease of native coronary artery without angina pectoris: Secondary | ICD-10-CM | POA: Diagnosis not present

## 2018-06-22 LAB — POCT GLYCOSYLATED HEMOGLOBIN (HGB A1C): HBA1C, POC (CONTROLLED DIABETIC RANGE): 9 % — AB (ref 0.0–7.0)

## 2018-06-22 MED ORDER — AMOXICILLIN 500 MG PO CAPS: 500.0000 mg | ORAL_CAPSULE | Freq: Three times a day (TID) | ORAL | 0 refills | Status: DC

## 2018-06-22 MED ORDER — FUROSEMIDE 40 MG PO TABS: 40.0000 mg | ORAL_TABLET | Freq: Every day | ORAL | 3 refills | Status: DC

## 2018-06-22 NOTE — Patient Instructions (Addendum)
While you have good insurance, you should get a colonoscopy.  I will put in a referral and someone should call.   When you get your eye exam next month, make sure that they send me a copy of the report. I will call with the blood test results. Please push your insulin dose to 75 units in the morning and stay at 50 units at night. I sent in a stronger fluid pill and an antibiotic for your ears.  Stay on all your other medications.

## 2018-06-23 LAB — BRAIN NATRIURETIC PEPTIDE: BNP: 64.8 pg/mL (ref 0.0–100.0)

## 2018-06-23 NOTE — Assessment & Plan Note (Signed)
AMox

## 2018-06-23 NOTE — Assessment & Plan Note (Signed)
Good control on current meds. 

## 2018-06-23 NOTE — Assessment & Plan Note (Signed)
Increase insulin does.

## 2018-06-23 NOTE — Assessment & Plan Note (Signed)
GI referral entered

## 2018-06-23 NOTE — Assessment & Plan Note (Signed)
BMP is reassuring that not CHF.  Will start lasix.

## 2018-06-23 NOTE — Progress Notes (Signed)
Established Patient Office Visit  Subjective:  Patient ID: Rodney [name], male    DOB: [birth year]  Age: 68 y.o. MRN: [id number]  CC:  Chief Complaint  Patient presents with  . URI  . Leg Swelling    HPI Rodney [name] presents for cold and worsening leg swelling.  Patient has acute cold for 2 weeks.  Improving but lingering symptom is bilateral ear pain and fullness.  No fever or cough.  DM taking insulin.  A1C is improved but still bad (11.0 to 9.0)   Leg swelling is worse since last visit.  Perhaps due to better glycemic control and less osmotic diuresis.  Never BMP or echo.    Finances:  Now has good insurance but cannot afford much of co pay.    Needs colonoscopy.  Has eye exam scheduled in 2 weeks.  History reviewed. No pertinent past medical history.  History reviewed. No pertinent surgical history.  History reviewed. No pertinent family history.  Social History   Socioeconomic History  . Marital status: Widowed    Spouse name: Not on file  . Number of children: Not on file  . Years of education: Not on file  . Highest education level: Not on file  Occupational History  . Not on file  Social Needs  . Financial resource strain: Not on file  . Food insecurity:    Worry: Not on file    Inability: Not on file  . Transportation needs:    Medical: Not on file    Non-medical: Not on file  Tobacco Use  . Smoking status: Current Every Day Smoker    Packs/day: 0.50    Types: Cigarettes  . Smokeless tobacco: Never Used  Substance and Sexual Activity  . Alcohol use: Yes    Alcohol/week: 6.0 standard drinks    Types: 6 drink(s) per week  . Drug use: No  . Sexual activity: Yes    Partners: Female    Comment: monagamous stable relationship  Lifestyle  . Physical activity:    Days per week: Not on file    Minutes per session: Not on file  . Stress: Not on file  Relationships  . Social connections:    Talks on phone: Not on file    Gets together: Not  on file    Attends religious service: Not on file    Active member of club or organization: Not on file    Attends meetings of clubs or organizations: Not on file    Relationship status: Not on file  . Intimate partner violence:    Fear of current or ex partner: Not on file    Emotionally abused: Not on file    Physically abused: Not on file    Forced sexual activity: Not on file  Other Topics Concern  . Not on file  Social History Narrative  . Not on file    Outpatient Medications Prior to Visit  Medication Sig Dispense Refill  . aspirin 81 MG tablet Take 81 mg by mouth daily.      Marland Kitchen atorvastatin (LIPITOR) 40 MG tablet Take 1 tablet (40 mg total) by mouth at bedtime. 90 tablet 3  . glucose blood (ACCU-CHEK AVIVA) test strip Use as instructed 300 each 3  . insulin NPH-regular Human (NOVOLIN 70/30 RELION) (70-30) 100 UNIT/ML injection 70 units every morning and 50 units every evening. 40 mL 3  . lisinopril-hydrochlorothiazide (PRINZIDE,ZESTORETIC) 20-25 MG tablet Take 1 tablet by mouth daily. 90 tablet 3  .  metFORMIN (GLUCOPHAGE) 1000 MG tablet Take 1 tablet (1,000 mg total) by mouth 2 (two) times daily with a meal. 180 tablet 3  . cephALEXin (KEFLEX) 500 MG capsule Take 1 capsule (500 mg total) by mouth 4 (four) times daily. 40 capsule 0   No facility-administered medications prior to visit.     No Known Allergies  ROS Review of Systems    Objective:    Physical Exam  BP (!) 114/58   Pulse 97   Temp 98 F (36.7 C) (Oral)   Ht 6' (1.829 m)   Wt 262 lb (118.8 kg)   SpO2 92%   BMI 35.53 kg/m  Wt Readings from Last 3 Encounters:  06/22/18 262 lb (118.8 kg)  02/24/18 255 lb 9.6 oz (115.9 kg)  02/21/18 257 lb 6.4 oz (116.8 kg)   TMs are red bilaterally Throat normal Neck no sig adenopathy Lungs clear Cardiac RRR with 2/6 SEM Ext 2+ edema bilaterally.  Health Maintenance Due  Topic Date Due  . COLONOSCOPY  05/25/2012  . OPHTHALMOLOGY EXAM  09/07/2014     There are no preventive care reminders to display for this patient.  No results found for: TSH Lab Results  Component Value Date   WBC 9.2 02/21/2018   HGB 13.3 02/21/2018   HCT 38.9 02/21/2018   MCV 87 02/21/2018   PLT 356 02/21/2018   Lab Results  Component Value Date   NA 134 02/21/2018   K 4.9 02/21/2018   CO2 24 02/21/2018   GLUCOSE 151 (H) 02/21/2018   BUN 26 02/21/2018   CREATININE 1.13 02/21/2018   BILITOT 0.4 02/21/2018   ALKPHOS 67 02/21/2018   AST 15 02/21/2018   ALT 15 02/21/2018   PROT 6.6 02/21/2018   ALBUMIN 3.9 02/21/2018   CALCIUM 9.3 02/21/2018   Lab Results  Component Value Date   CHOL 140 02/21/2018   Lab Results  Component Value Date   HDL 33 (L) 02/21/2018   Lab Results  Component Value Date   LDLCALC 79 02/21/2018   Lab Results  Component Value Date   TRIG 142 02/21/2018   Lab Results  Component Value Date   CHOLHDL 4.2 02/21/2018   Lab Results  Component Value Date   HGBA1C 9.0 (A) 06/22/2018      Assessment & Plan:   Problem List Items Addressed This Visit    Uncontrolled type 2 diabetes mellitus with diabetic neuropathy, with long-term current use of insulin ([place]) - Primary   Relevant Orders   HgB A1c (Completed)   Peripheral edema   Relevant Medications   furosemide (LASIX) 40 MG tablet   Other Relevant Orders   Brain natriuretic peptide (Completed)   Colon cancer screening   Relevant Orders   Ambulatory referral to Gastroenterology   Bilateral otitis media   Relevant Medications   amoxicillin (AMOXIL) 500 MG capsule      Meds ordered this encounter  Medications  . amoxicillin (AMOXIL) 500 MG capsule    Sig: Take 1 capsule (500 mg total) by mouth 3 (three) times daily.    Dispense:  30 capsule    Refill:  0  . furosemide (LASIX) 40 MG tablet    Sig: Take 1 tablet (40 mg total) by mouth daily.    Dispense:  90 tablet    Refill:  3    Follow-up: No follow-ups on file.    [name], MD

## 2018-07-26 ENCOUNTER — Encounter: Payer: Self-pay | Admitting: Family Medicine

## 2018-07-26 DIAGNOSIS — R0600 Dyspnea, unspecified: Secondary | ICD-10-CM | POA: Insufficient documentation

## 2018-07-26 DIAGNOSIS — R0602 Shortness of breath: Secondary | ICD-10-CM

## 2018-07-26 HISTORY — DX: Shortness of breath: R06.02

## 2018-10-25 ENCOUNTER — Telehealth: Payer: Self-pay | Admitting: *Deleted

## 2018-10-25 DIAGNOSIS — IMO0002 Reserved for concepts with insufficient information to code with codable children: Secondary | ICD-10-CM

## 2018-10-25 DIAGNOSIS — E1165 Type 2 diabetes mellitus with hyperglycemia: Secondary | ICD-10-CM

## 2018-10-25 DIAGNOSIS — E1140 Type 2 diabetes mellitus with diabetic neuropathy, unspecified: Secondary | ICD-10-CM

## 2018-10-25 MED ORDER — NOVOLIN 70/30 RELION (70-30) 100 UNIT/ML ~~LOC~~ SUSP: SUBCUTANEOUS | 0 refills | Status: DC

## 2018-10-25 NOTE — Telephone Encounter (Signed)
Pts significant other, [name], calls because pt took last dose of insuline this am.   They have recently had to put some money into their car and are short on cash until Monday.  Spoke with [employer]. Novolin @ [facility] is $25.  Will send script for one vial to [facility] on [address].  Pt will come pick up $25 (cash) for medication. Christen Bame, CMA

## 2018-10-25 NOTE — Telephone Encounter (Signed)
Noted and agree. 

## 2018-12-16 ENCOUNTER — Other Ambulatory Visit: Payer: Self-pay | Admitting: Family Medicine

## 2018-12-16 DIAGNOSIS — IMO0002 Reserved for concepts with insufficient information to code with codable children: Secondary | ICD-10-CM

## 2018-12-16 DIAGNOSIS — E1165 Type 2 diabetes mellitus with hyperglycemia: Secondary | ICD-10-CM

## 2018-12-16 DIAGNOSIS — I10 Essential (primary) hypertension: Secondary | ICD-10-CM

## 2018-12-16 DIAGNOSIS — E1140 Type 2 diabetes mellitus with diabetic neuropathy, unspecified: Secondary | ICD-10-CM

## 2019-08-02 ENCOUNTER — Other Ambulatory Visit: Payer: Self-pay | Admitting: Family Medicine

## 2019-08-02 DIAGNOSIS — R609 Edema, unspecified: Secondary | ICD-10-CM

## 2019-09-21 ENCOUNTER — Other Ambulatory Visit: Payer: Self-pay

## 2019-09-21 ENCOUNTER — Ambulatory Visit
Admission: RE | Admit: 2019-09-21 | Discharge: 2019-09-21 | Disposition: A | Discharge status: Home / Self Care | Payer: Medicare PPO | Source: Ambulatory Visit | Attending: Family Medicine | Admitting: Family Medicine

## 2019-09-21 ENCOUNTER — Ambulatory Visit (INDEPENDENT_AMBULATORY_CARE_PROVIDER_SITE_OTHER): Payer: Medicare PPO | Admitting: Family Medicine

## 2019-09-21 VITALS — BP 118/62 | HR 89 | Temp 98.9°F

## 2019-09-21 DIAGNOSIS — IMO0002 Reserved for concepts with insufficient information to code with codable children: Secondary | ICD-10-CM

## 2019-09-21 DIAGNOSIS — L97529 Non-pressure chronic ulcer of other part of left foot with unspecified severity: Secondary | ICD-10-CM

## 2019-09-21 DIAGNOSIS — Z794 Long term (current) use of insulin: Secondary | ICD-10-CM | POA: Diagnosis not present

## 2019-09-21 DIAGNOSIS — E1140 Type 2 diabetes mellitus with diabetic neuropathy, unspecified: Secondary | ICD-10-CM

## 2019-09-21 DIAGNOSIS — L0390 Cellulitis, unspecified: Secondary | ICD-10-CM

## 2019-09-21 DIAGNOSIS — E1165 Type 2 diabetes mellitus with hyperglycemia: Secondary | ICD-10-CM

## 2019-09-21 DIAGNOSIS — M79672 Pain in left foot: Secondary | ICD-10-CM

## 2019-09-21 LAB — POCT GLYCOSYLATED HEMOGLOBIN (HGB A1C): HbA1c, POC (controlled diabetic range): 8.7 % — AB (ref 0.0–7.0)

## 2019-09-21 IMAGING — CR DG FOOT COMPLETE 3+V*L*
3 series · 3 of 3 positions shown · non-contrast
Comparison: None.

CLINICAL DATA: Left lateral heel pain for the past 2-3 months.
Clinical suspicion for osteomyelitis.

EXAM:
LEFT FOOT - COMPLETE 3+ VIEW

[x foot ap left]
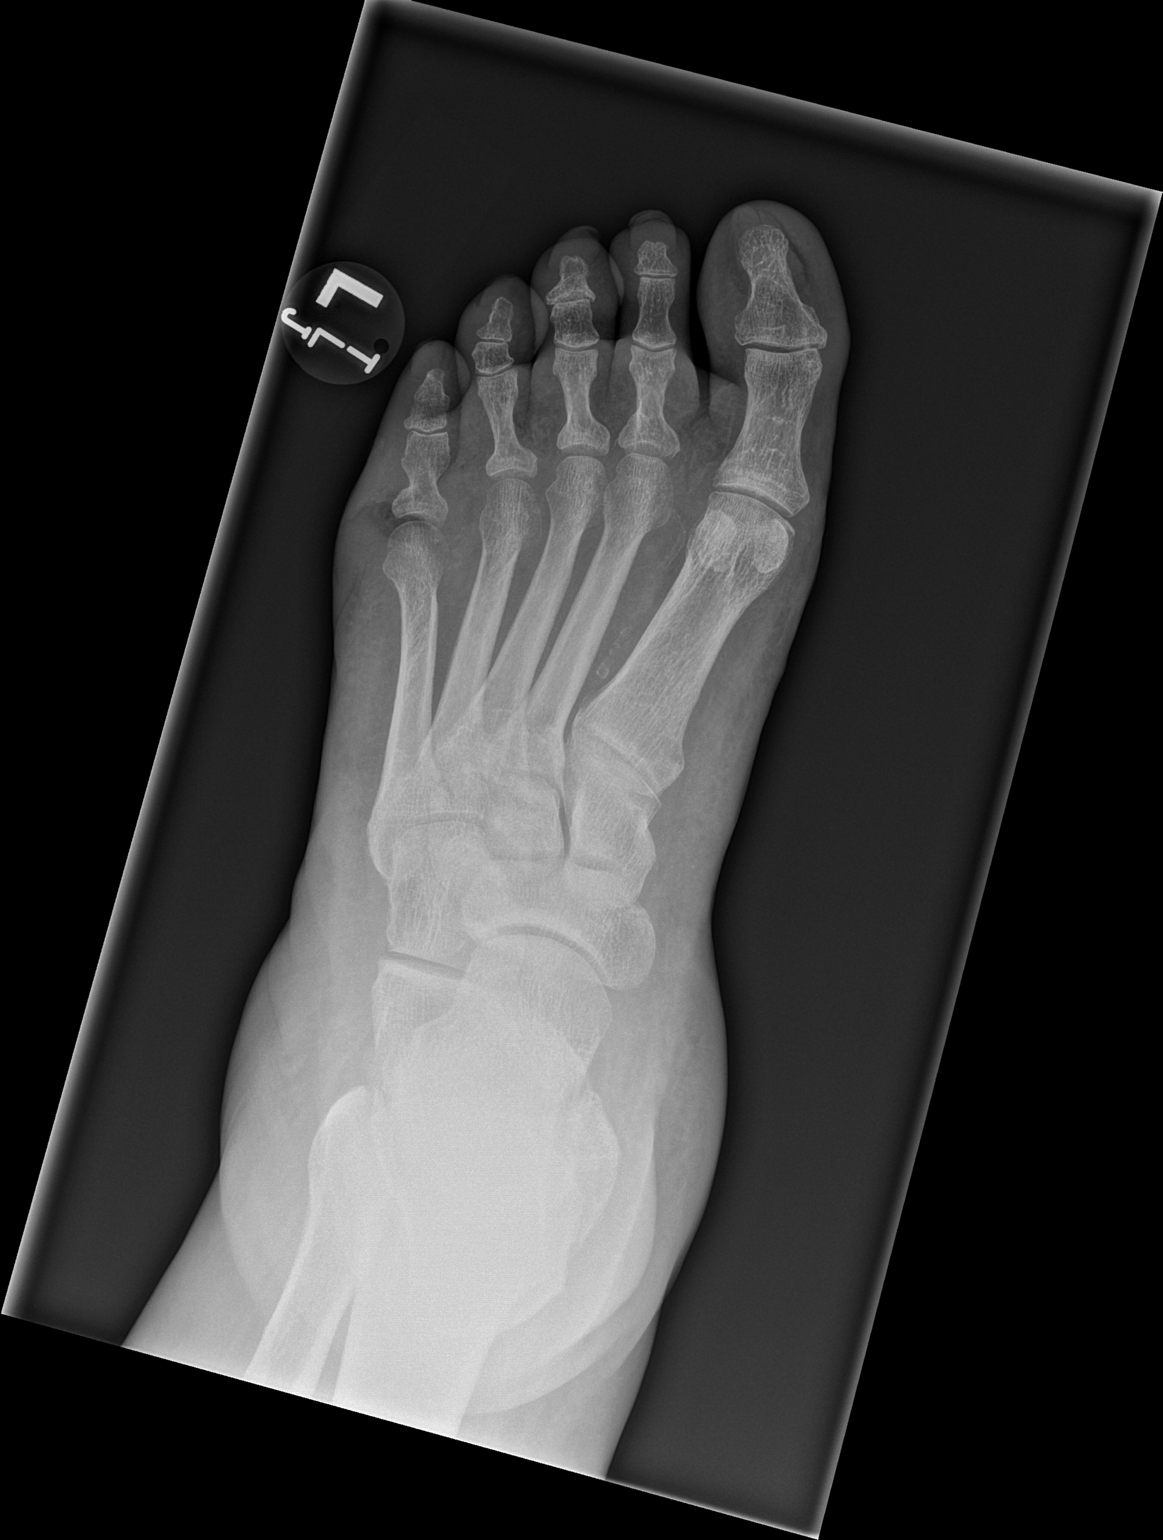

[x foot obl left]
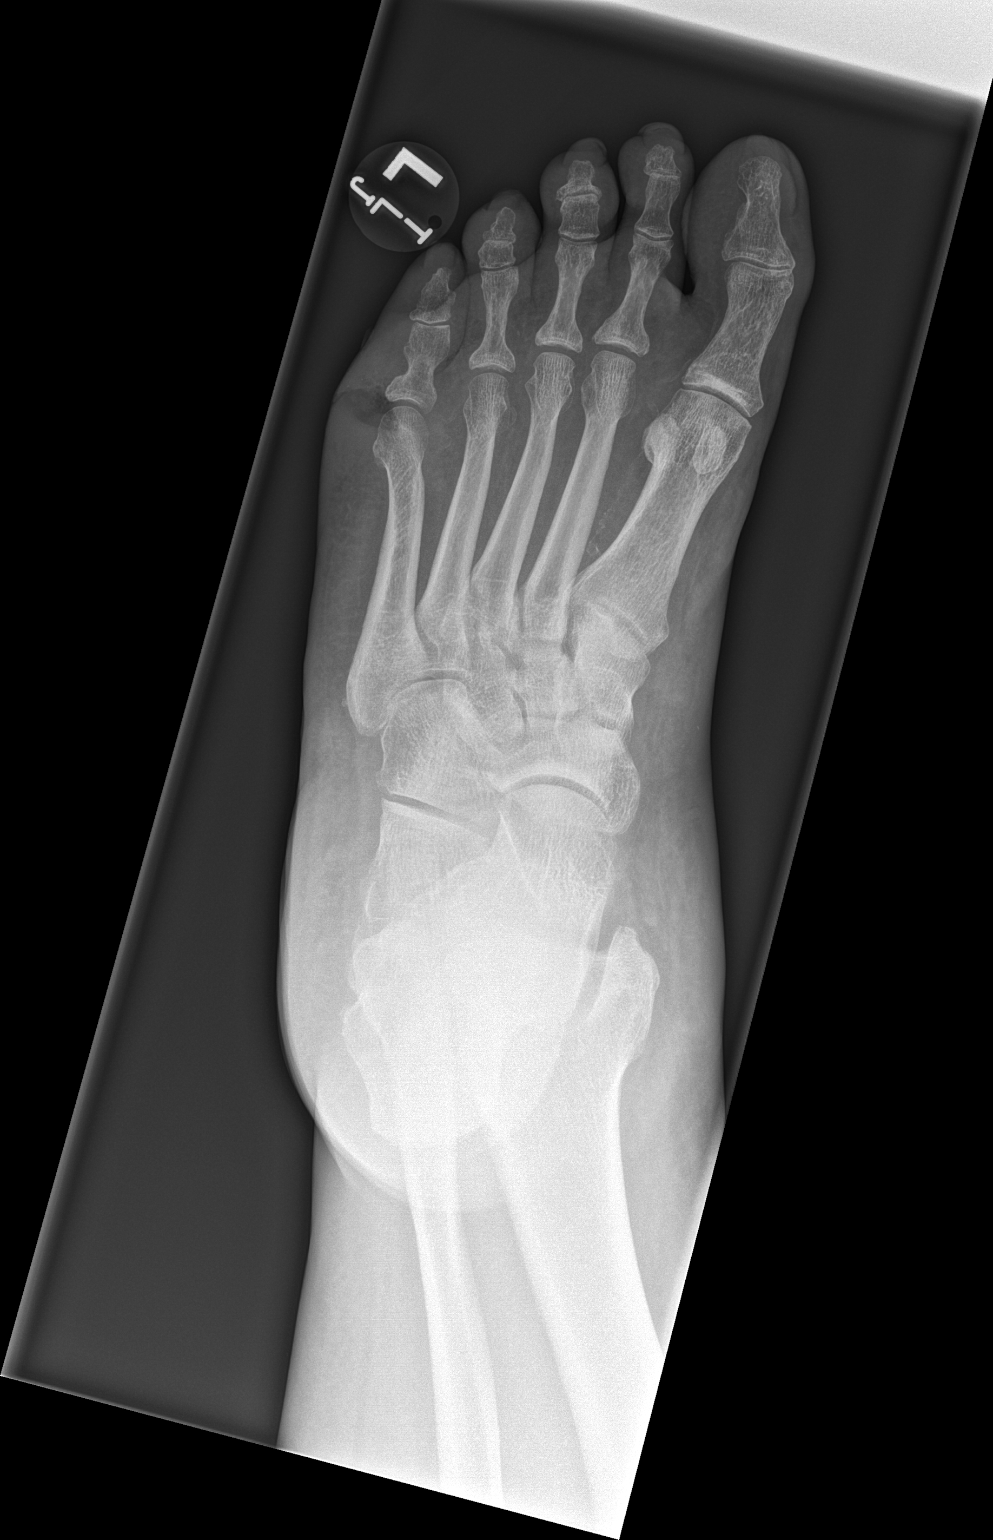

[x foot lat left]
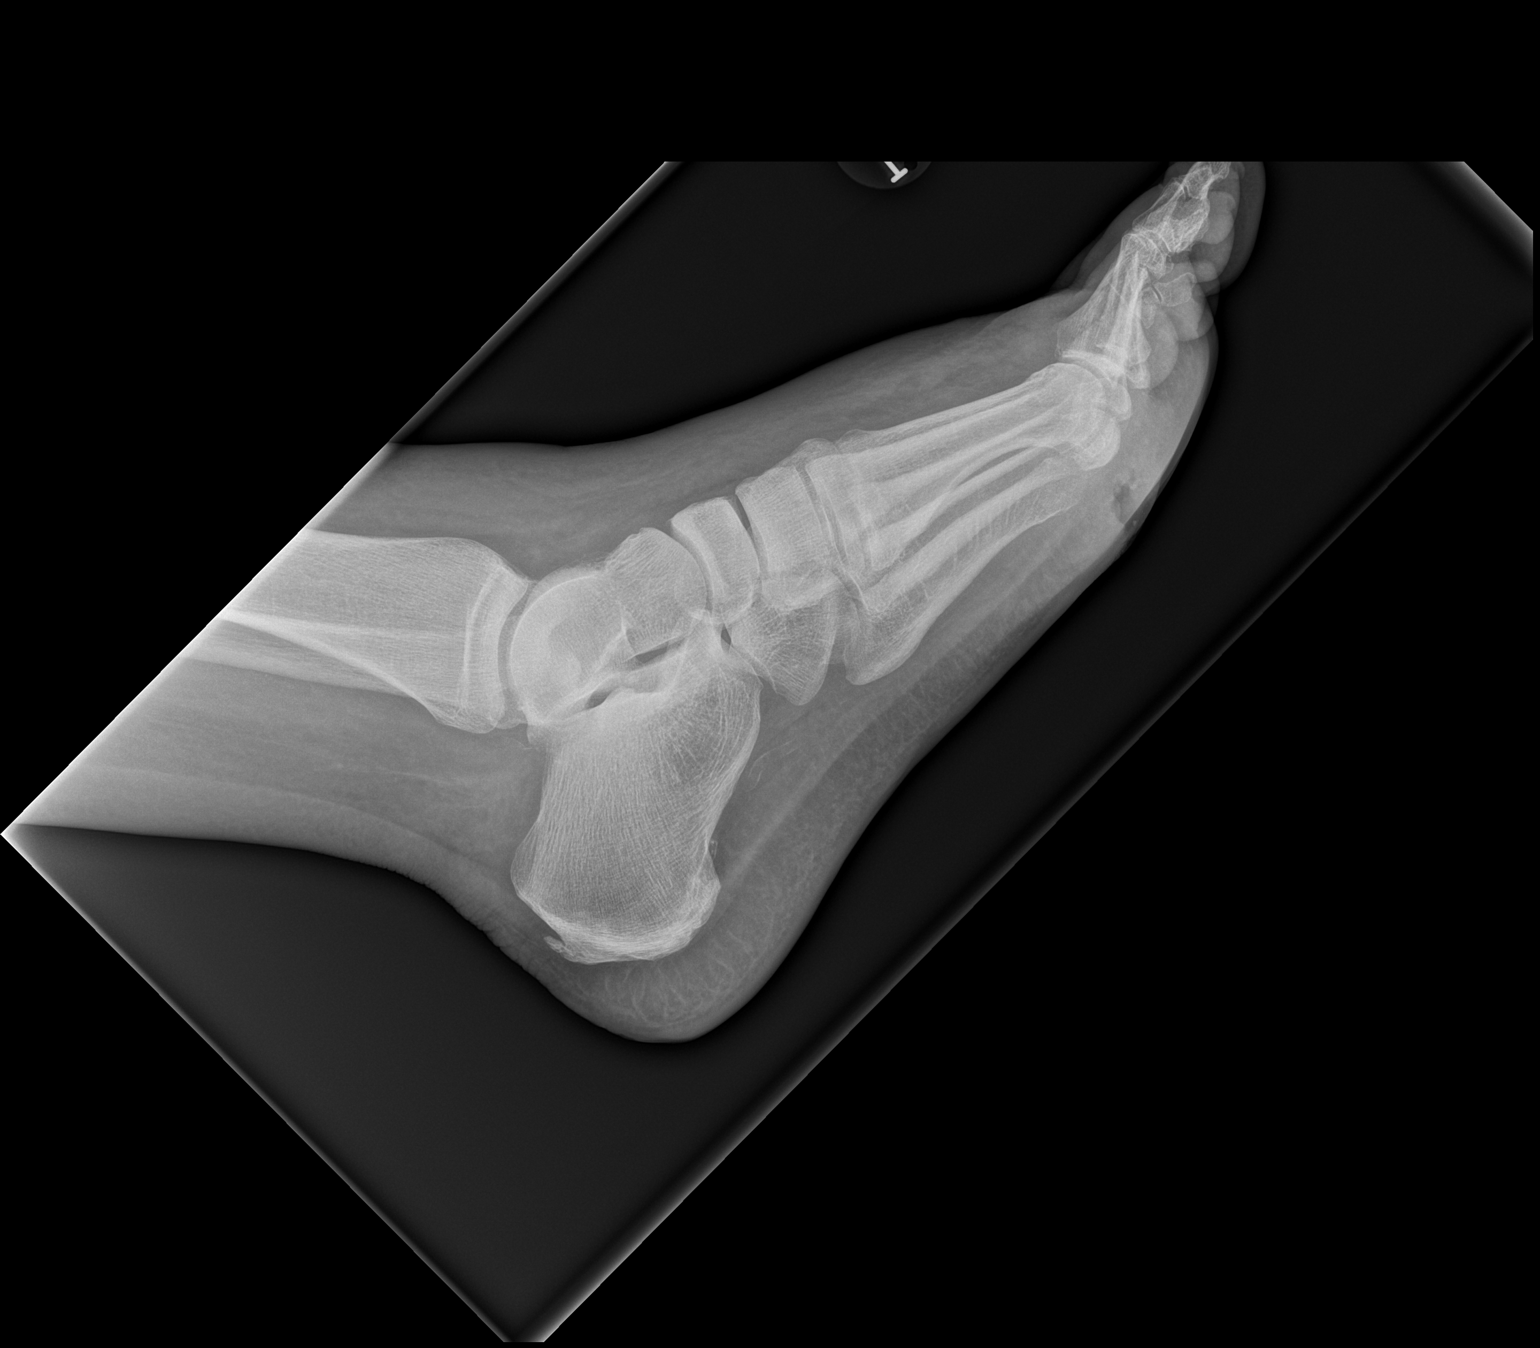

[3 of 3 positions shown; findings below may reference images not displayed]

FINDINGS: Soft tissue defect in the plantar soft tissues at the level of the
5th MTP joint. Diffuse soft tissue swelling, most pronounced
dorsally. No soft tissue gas, bone destruction or periosteal
reaction. Atheromatous arterial calcifications. Moderate posterior
patellar enthesophyte formation.
IMPRESSION: Soft tissue defect in the plantar soft tissues at the level of the
5th MTP joint with no radiographic evidence of osteomyelitis.

## 2019-09-21 MED ORDER — SULFAMETHOXAZOLE-TRIMETHOPRIM 800-160 MG PO TABS: 1.0000 | ORAL_TABLET | Freq: Two times a day (BID) | ORAL | 0 refills | Status: DC

## 2019-09-21 NOTE — Patient Instructions (Addendum)
It was great seeing you today!  Your foot pain is coming from a skin infection called cellulitis.  We can treat this with Bactrim 2 times a day for 7 days.  I am more concerned about his deep ulcer that you have on your foot.  I think this may represent an infection down into the bone, for this I will get foot x-rays.  Please note that foot x-rays do not rule out the presence of a bone infection, so if it is negative we may need to get an MRI.  If it is a slam dunk positive result though it can save you from the MRI.

## 2019-09-22 ENCOUNTER — Encounter: Payer: Self-pay | Admitting: Family Medicine

## 2019-09-22 DIAGNOSIS — L0390 Cellulitis, unspecified: Secondary | ICD-10-CM | POA: Insufficient documentation

## 2019-09-22 DIAGNOSIS — L97509 Non-pressure chronic ulcer of other part of unspecified foot with unspecified severity: Secondary | ICD-10-CM | POA: Insufficient documentation

## 2019-09-22 NOTE — Assessment & Plan Note (Signed)
Pain on dorsal aspect of foot is consistent with cellulitis.  This likely represents superinfection of either his onychomycosis noted his nails or his deep ulcer over the fifth MTP.  Discussed with Dr. Erin Hearing who felt that the ulcer likely did not represent osteomyelitis.  Will treat with Bactrim 2 times a day for 7 days to cover for MRSA infection.  Please see plan for the ulcer under problem based charting.

## 2019-09-22 NOTE — Assessment & Plan Note (Signed)
Patient with deep ulcer on the plantar surface overlying fifth MTP.  Does not probe to bone not felt to be deep enough to represent osteomyelitis based on visual examination.  Will get left foot x-rays to further evaluate.  I explained to the patient that x-rays do not rule out the presence of osteomyelitis but they can be helpful in ruling it in.  Unfortunately the patient does not have the appropriate findings to go to the wound care center or likely to get an MRI of his left foot.  If the x-rays are negative I would recommend the patient to follow-up with his PCP in around 1 week for further evaluation.  Unfortunately I think that due to his lack of insurance and finds his options are quite limited.  Could also consider podiatry referral although this runs into the same issues of his finances.

## 2019-09-22 NOTE — Assessment & Plan Note (Signed)
Hemoglobin A1c 8.7 from 9.0.  Currently takes metformin 1 g twice daily and insulin 70/30 70 units in the morning, 50 units in the evening.  Unclear how compliant he has been on these medications.  He does appear to have significant neuropathy as he was unaware of a large ulcer on his left foot.  I do recommend for him to follow-up with his PCP for further management, coupled with the ulcer management.

## 2019-09-22 NOTE — Progress Notes (Signed)
CHIEF COMPLAINT / HPI: 69 year old male who presents with left foot pain.  The patient is somewhat of a difficult historian, but states that the foot pain has been there for "a while".  He states that he lives with foot pain constantly, but it has become much worse and in a different location over the last few days.  Location is the dorsolateral component of his left foot.  He states that he has had associated swelling and redness.  Of note the patient has a history of cellulitis of this foot.  During the exam was discovered the patient had a large ulcer directly overlying the plantar aspect of his fifth MTP.  When asked how long it has been there the patient states that he was not sure but it was "a long time".  PERTINENT  PMH / PSH:    OBJECTIVE: BP 118/62   Pulse 89   Temp 98.9 F (37.2 C)   SpO2 96%   Gen: Pleasant 69 year old Caucasian male, unkempt appearance with poor hygiene noted Resp: No accessory muscle use, no respiratory distress Neuro: Alert and oriented, Speech clear, No gross deficits  Left foot: Swelling, erythema, tenderness to touch noted at the dorsal aspect of lateral distal foot.  On the plantar surface of his left fifth MTP a deep ulcer was noted.  The ulcer did have purulent smell, could not probe all the way to bone.  No bleeding noted.  Very little sensation at this ulcer site.  Patient with thickened nails, consistent with onychomycosis.  An attempt to place a picture of the fifth MTP ulcer in the chart was unsuccessful, given epic downtime.  ASSESSMENT / PLAN:  Cellulitis Pain on dorsal aspect of foot is consistent with cellulitis.  This likely represents superinfection of either his onychomycosis noted his nails or his deep ulcer over the fifth MTP.  Discussed with Dr. Erin Hearing who felt that the ulcer likely did not represent osteomyelitis.  Will treat with Bactrim 2 times a day for 7 days to cover for MRSA infection.  Please see plan for the ulcer under  problem based charting.  Foot ulcer ([place]) Patient with deep ulcer on the plantar surface overlying fifth MTP.  Does not probe to bone not felt to be deep enough to represent osteomyelitis based on visual examination.  Will get left foot x-rays to further evaluate.  I explained to the patient that x-rays do not rule out the presence of osteomyelitis but they can be helpful in ruling it in.  Unfortunately the patient does not have the appropriate findings to go to the wound care center or likely to get an MRI of his left foot.  If the x-rays are negative I would recommend the patient to follow-up with his PCP in around 1 week for further evaluation.  Unfortunately I think that due to his lack of insurance and finds his options are quite limited.  Could also consider podiatry referral although this runs into the same issues of his finances.  Uncontrolled type 2 diabetes mellitus with diabetic neuropathy, with long-term current use of insulin (HCC) Hemoglobin A1c 8.7 from 9.0.  Currently takes metformin 1 g twice daily and insulin 70/30 70 units in the morning, 50 units in the evening.  Unclear how compliant he has been on these medications.  He does appear to have significant neuropathy as he was unaware of a large ulcer on his left foot.  I do recommend for him to follow-up with his PCP for further management,  coupled with the ulcer management.    Guadalupe Dawn MD PGY-3 Family Medicine Resident [place]

## 2019-09-24 ENCOUNTER — Telehealth: Payer: Self-pay | Admitting: Family Medicine

## 2019-09-24 NOTE — Telephone Encounter (Signed)
Received after hours call from patient's spouse regarding his foot infection that was recently addressed in his last office visit.  She states that his leg was swelling and he did not have an appetite today.  He has been taking his Bactrim as prescribed during the last visit.  She states that he is "much better" mentally than when he came in on Thursday to our office.  She has not checked his temperature so she does not know if he has a fever or not.  She denies the patient has vomiting or diarrhea.  X-ray taken on 5/28 did not indicate osteomyelitis, although he would need MRI to officially rule this out.  Unfortunately there are no pictures available from his office visit last week.  I booked the patient a appointment in our access to care on Tuesday.  Advised that he come in to the emergency department if any of the following occur: He has a fever, he starts having vomiting or diarrhea, his mental status declines or he develops confusion, or if the rash spreads higher up his leg.

## 2019-09-26 ENCOUNTER — Emergency Department (HOSPITAL_COMMUNITY): Payer: Medicare PPO

## 2019-09-26 ENCOUNTER — Other Ambulatory Visit: Payer: Self-pay

## 2019-09-26 ENCOUNTER — Ambulatory Visit (INDEPENDENT_AMBULATORY_CARE_PROVIDER_SITE_OTHER): Payer: Medicare PPO | Admitting: Family Medicine

## 2019-09-26 ENCOUNTER — Encounter (HOSPITAL_COMMUNITY): Payer: Self-pay

## 2019-09-26 ENCOUNTER — Inpatient Hospital Stay (HOSPITAL_COMMUNITY)
Admission: EM | Admit: 2019-09-26 | Discharge: 2019-10-09 | DRG: 853 | Disposition: A | Discharge status: Skilled Nursing Facility | Payer: Medicare PPO | Attending: Family Medicine | Admitting: Family Medicine

## 2019-09-26 ENCOUNTER — Telehealth: Payer: Self-pay

## 2019-09-26 DIAGNOSIS — S92512A Displaced fracture of proximal phalanx of left lesser toe(s), initial encounter for closed fracture: Secondary | ICD-10-CM | POA: Diagnosis not present

## 2019-09-26 DIAGNOSIS — I4819 Other persistent atrial fibrillation: Secondary | ICD-10-CM | POA: Diagnosis not present

## 2019-09-26 DIAGNOSIS — L03115 Cellulitis of right lower limb: Secondary | ICD-10-CM | POA: Diagnosis present

## 2019-09-26 DIAGNOSIS — F17210 Nicotine dependence, cigarettes, uncomplicated: Secondary | ICD-10-CM | POA: Diagnosis present

## 2019-09-26 DIAGNOSIS — I130 Hypertensive heart and chronic kidney disease with heart failure and stage 1 through stage 4 chronic kidney disease, or unspecified chronic kidney disease: Secondary | ICD-10-CM | POA: Diagnosis present

## 2019-09-26 DIAGNOSIS — E1140 Type 2 diabetes mellitus with diabetic neuropathy, unspecified: Secondary | ICD-10-CM

## 2019-09-26 DIAGNOSIS — Z03818 Encounter for observation for suspected exposure to other biological agents ruled out: Secondary | ICD-10-CM | POA: Diagnosis not present

## 2019-09-26 DIAGNOSIS — D638 Anemia in other chronic diseases classified elsewhere: Secondary | ICD-10-CM | POA: Diagnosis not present

## 2019-09-26 DIAGNOSIS — R918 Other nonspecific abnormal finding of lung field: Secondary | ICD-10-CM | POA: Diagnosis not present

## 2019-09-26 DIAGNOSIS — R000 Tachycardia, unspecified: Secondary | ICD-10-CM | POA: Diagnosis not present

## 2019-09-26 DIAGNOSIS — I4891 Unspecified atrial fibrillation: Secondary | ICD-10-CM | POA: Diagnosis not present

## 2019-09-26 DIAGNOSIS — M86172 Other acute osteomyelitis, left ankle and foot: Secondary | ICD-10-CM | POA: Diagnosis present

## 2019-09-26 DIAGNOSIS — I70249 Atherosclerosis of native arteries of left leg with ulceration of unspecified site: Secondary | ICD-10-CM | POA: Diagnosis not present

## 2019-09-26 DIAGNOSIS — E1151 Type 2 diabetes mellitus with diabetic peripheral angiopathy without gangrene: Secondary | ICD-10-CM | POA: Diagnosis not present

## 2019-09-26 DIAGNOSIS — E43 Unspecified severe protein-calorie malnutrition: Secondary | ICD-10-CM | POA: Diagnosis not present

## 2019-09-26 DIAGNOSIS — L0390 Cellulitis, unspecified: Secondary | ICD-10-CM | POA: Diagnosis present

## 2019-09-26 DIAGNOSIS — I10 Essential (primary) hypertension: Secondary | ICD-10-CM | POA: Diagnosis present

## 2019-09-26 DIAGNOSIS — R6520 Severe sepsis without septic shock: Secondary | ICD-10-CM | POA: Diagnosis present

## 2019-09-26 DIAGNOSIS — I96 Gangrene, not elsewhere classified: Secondary | ICD-10-CM | POA: Diagnosis not present

## 2019-09-26 DIAGNOSIS — I2510 Atherosclerotic heart disease of native coronary artery without angina pectoris: Secondary | ICD-10-CM | POA: Diagnosis present

## 2019-09-26 DIAGNOSIS — Z811 Family history of alcohol abuse and dependence: Secondary | ICD-10-CM

## 2019-09-26 DIAGNOSIS — E1122 Type 2 diabetes mellitus with diabetic chronic kidney disease: Secondary | ICD-10-CM | POA: Diagnosis present

## 2019-09-26 DIAGNOSIS — R600 Localized edema: Secondary | ICD-10-CM | POA: Diagnosis not present

## 2019-09-26 DIAGNOSIS — L03032 Cellulitis of left toe: Secondary | ICD-10-CM | POA: Diagnosis not present

## 2019-09-26 DIAGNOSIS — I4892 Unspecified atrial flutter: Secondary | ICD-10-CM

## 2019-09-26 DIAGNOSIS — J9601 Acute respiratory failure with hypoxia: Secondary | ICD-10-CM | POA: Diagnosis not present

## 2019-09-26 DIAGNOSIS — E7800 Pure hypercholesterolemia, unspecified: Secondary | ICD-10-CM | POA: Diagnosis not present

## 2019-09-26 DIAGNOSIS — E1165 Type 2 diabetes mellitus with hyperglycemia: Secondary | ICD-10-CM

## 2019-09-26 DIAGNOSIS — I480 Paroxysmal atrial fibrillation: Secondary | ICD-10-CM | POA: Diagnosis not present

## 2019-09-26 DIAGNOSIS — L03116 Cellulitis of left lower limb: Secondary | ICD-10-CM | POA: Diagnosis present

## 2019-09-26 DIAGNOSIS — L02612 Cutaneous abscess of left foot: Secondary | ICD-10-CM | POA: Diagnosis present

## 2019-09-26 DIAGNOSIS — L089 Local infection of the skin and subcutaneous tissue, unspecified: Secondary | ICD-10-CM | POA: Diagnosis not present

## 2019-09-26 DIAGNOSIS — L97509 Non-pressure chronic ulcer of other part of unspecified foot with unspecified severity: Secondary | ICD-10-CM | POA: Diagnosis present

## 2019-09-26 DIAGNOSIS — E1152 Type 2 diabetes mellitus with diabetic peripheral angiopathy with gangrene: Secondary | ICD-10-CM | POA: Diagnosis not present

## 2019-09-26 DIAGNOSIS — Z89512 Acquired absence of left leg below knee: Secondary | ICD-10-CM | POA: Diagnosis not present

## 2019-09-26 DIAGNOSIS — Z20822 Contact with and (suspected) exposure to covid-19: Secondary | ICD-10-CM | POA: Diagnosis present

## 2019-09-26 DIAGNOSIS — N179 Acute kidney failure, unspecified: Secondary | ICD-10-CM | POA: Diagnosis not present

## 2019-09-26 DIAGNOSIS — R0602 Shortness of breath: Secondary | ICD-10-CM | POA: Diagnosis not present

## 2019-09-26 DIAGNOSIS — Z4781 Encounter for orthopedic aftercare following surgical amputation: Secondary | ICD-10-CM | POA: Diagnosis not present

## 2019-09-26 DIAGNOSIS — I700 Atherosclerosis of aorta: Secondary | ICD-10-CM | POA: Diagnosis present

## 2019-09-26 DIAGNOSIS — A480 Gas gangrene: Secondary | ICD-10-CM | POA: Diagnosis present

## 2019-09-26 DIAGNOSIS — A419 Sepsis, unspecified organism: Principal | ICD-10-CM | POA: Diagnosis present

## 2019-09-26 DIAGNOSIS — Z6832 Body mass index (BMI) 32.0-32.9, adult: Secondary | ICD-10-CM

## 2019-09-26 DIAGNOSIS — R5381 Other malaise: Secondary | ICD-10-CM | POA: Diagnosis not present

## 2019-09-26 DIAGNOSIS — M726 Necrotizing fasciitis: Secondary | ICD-10-CM | POA: Diagnosis present

## 2019-09-26 DIAGNOSIS — R0902 Hypoxemia: Secondary | ICD-10-CM | POA: Diagnosis not present

## 2019-09-26 DIAGNOSIS — I5043 Acute on chronic combined systolic (congestive) and diastolic (congestive) heart failure: Secondary | ICD-10-CM | POA: Diagnosis not present

## 2019-09-26 DIAGNOSIS — E1149 Type 2 diabetes mellitus with other diabetic neurological complication: Secondary | ICD-10-CM

## 2019-09-26 DIAGNOSIS — I739 Peripheral vascular disease, unspecified: Secondary | ICD-10-CM | POA: Diagnosis not present

## 2019-09-26 DIAGNOSIS — M79609 Pain in unspecified limb: Secondary | ICD-10-CM | POA: Diagnosis not present

## 2019-09-26 DIAGNOSIS — L97529 Non-pressure chronic ulcer of other part of left foot with unspecified severity: Secondary | ICD-10-CM | POA: Diagnosis not present

## 2019-09-26 DIAGNOSIS — E1169 Type 2 diabetes mellitus with other specified complication: Secondary | ICD-10-CM | POA: Diagnosis not present

## 2019-09-26 DIAGNOSIS — M86162 Other acute osteomyelitis, left tibia and fibula: Secondary | ICD-10-CM | POA: Diagnosis not present

## 2019-09-26 DIAGNOSIS — I484 Atypical atrial flutter: Secondary | ICD-10-CM | POA: Diagnosis not present

## 2019-09-26 DIAGNOSIS — D62 Acute posthemorrhagic anemia: Secondary | ICD-10-CM | POA: Diagnosis not present

## 2019-09-26 DIAGNOSIS — Z7982 Long term (current) use of aspirin: Secondary | ICD-10-CM

## 2019-09-26 DIAGNOSIS — I255 Ischemic cardiomyopathy: Secondary | ICD-10-CM | POA: Diagnosis present

## 2019-09-26 DIAGNOSIS — I350 Nonrheumatic aortic (valve) stenosis: Secondary | ICD-10-CM | POA: Diagnosis not present

## 2019-09-26 DIAGNOSIS — Z7401 Bed confinement status: Secondary | ICD-10-CM | POA: Diagnosis not present

## 2019-09-26 DIAGNOSIS — E46 Unspecified protein-calorie malnutrition: Secondary | ICD-10-CM | POA: Diagnosis present

## 2019-09-26 DIAGNOSIS — Z794 Long term (current) use of insulin: Secondary | ICD-10-CM

## 2019-09-26 DIAGNOSIS — L97929 Non-pressure chronic ulcer of unspecified part of left lower leg with unspecified severity: Secondary | ICD-10-CM | POA: Diagnosis not present

## 2019-09-26 DIAGNOSIS — E785 Hyperlipidemia, unspecified: Secondary | ICD-10-CM | POA: Diagnosis not present

## 2019-09-26 DIAGNOSIS — E871 Hypo-osmolality and hyponatremia: Secondary | ICD-10-CM | POA: Diagnosis not present

## 2019-09-26 DIAGNOSIS — I5020 Unspecified systolic (congestive) heart failure: Secondary | ICD-10-CM | POA: Diagnosis not present

## 2019-09-26 DIAGNOSIS — N1831 Chronic kidney disease, stage 3a: Secondary | ICD-10-CM | POA: Diagnosis present

## 2019-09-26 DIAGNOSIS — I5041 Acute combined systolic (congestive) and diastolic (congestive) heart failure: Secondary | ICD-10-CM | POA: Diagnosis not present

## 2019-09-26 DIAGNOSIS — I110 Hypertensive heart disease with heart failure: Secondary | ICD-10-CM | POA: Diagnosis not present

## 2019-09-26 DIAGNOSIS — E11621 Type 2 diabetes mellitus with foot ulcer: Secondary | ICD-10-CM | POA: Diagnosis present

## 2019-09-26 DIAGNOSIS — J189 Pneumonia, unspecified organism: Secondary | ICD-10-CM | POA: Diagnosis not present

## 2019-09-26 DIAGNOSIS — M2550 Pain in unspecified joint: Secondary | ICD-10-CM | POA: Diagnosis not present

## 2019-09-26 DIAGNOSIS — I340 Nonrheumatic mitral (valve) insufficiency: Secondary | ICD-10-CM | POA: Diagnosis not present

## 2019-09-26 DIAGNOSIS — G8918 Other acute postprocedural pain: Secondary | ICD-10-CM | POA: Diagnosis not present

## 2019-09-26 DIAGNOSIS — D631 Anemia in chronic kidney disease: Secondary | ICD-10-CM | POA: Diagnosis present

## 2019-09-26 DIAGNOSIS — L97523 Non-pressure chronic ulcer of other part of left foot with necrosis of muscle: Secondary | ICD-10-CM | POA: Diagnosis present

## 2019-09-26 HISTORY — DX: Hyperlipidemia, unspecified: E78.5

## 2019-09-26 HISTORY — DX: Essential (primary) hypertension: I10

## 2019-09-26 HISTORY — DX: Type 2 diabetes mellitus without complications: E11.9

## 2019-09-26 LAB — CBC WITH DIFFERENTIAL/PLATELET
Abs Immature Granulocytes: 0.2 10*3/uL — ABNORMAL HIGH (ref 0.00–0.07)
Basophils Absolute: 0.1 10*3/uL (ref 0.0–0.1)
Basophils Relative: 0 %
Eosinophils Absolute: 0.1 10*3/uL (ref 0.0–0.5)
Eosinophils Relative: 1 %
HCT: 30.7 % — ABNORMAL LOW (ref 39.0–52.0)
Hemoglobin: 10.1 g/dL — ABNORMAL LOW (ref 13.0–17.0)
Immature Granulocytes: 1 %
Lymphocytes Relative: 8 %
Lymphs Abs: 1.4 10*3/uL (ref 0.7–4.0)
MCH: 29.7 pg (ref 26.0–34.0)
MCHC: 32.9 g/dL (ref 30.0–36.0)
MCV: 90.3 fL (ref 80.0–100.0)
Monocytes Absolute: 0.8 10*3/uL (ref 0.1–1.0)
Monocytes Relative: 5 %
Neutro Abs: 14.8 10*3/uL — ABNORMAL HIGH (ref 1.7–7.7)
Neutrophils Relative %: 85 %
Platelets: 351 10*3/uL (ref 150–400)
RBC: 3.4 MIL/uL — ABNORMAL LOW (ref 4.22–5.81)
RDW: 12.9 % (ref 11.5–15.5)
WBC: 17.4 10*3/uL — ABNORMAL HIGH (ref 4.0–10.5)
nRBC: 0 % (ref 0.0–0.2)

## 2019-09-26 LAB — URINALYSIS, ROUTINE W REFLEX MICROSCOPIC
Bilirubin Urine: NEGATIVE
Glucose, UA: NEGATIVE mg/dL
Hgb urine dipstick: NEGATIVE
Ketones, ur: NEGATIVE mg/dL
Leukocytes,Ua: NEGATIVE
Nitrite: NEGATIVE
Protein, ur: NEGATIVE mg/dL
Specific Gravity, Urine: 1.016 (ref 1.005–1.030)
pH: 5 (ref 5.0–8.0)

## 2019-09-26 LAB — COMPREHENSIVE METABOLIC PANEL
ALT: 71 U/L — ABNORMAL HIGH (ref 0–44)
AST: 53 U/L — ABNORMAL HIGH (ref 15–41)
Albumin: 2.2 g/dL — ABNORMAL LOW (ref 3.5–5.0)
Alkaline Phosphatase: 198 U/L — ABNORMAL HIGH (ref 38–126)
Anion gap: 13 (ref 5–15)
BUN: 64 mg/dL — ABNORMAL HIGH (ref 8–23)
CO2: 26 mmol/L (ref 22–32)
Calcium: 9.4 mg/dL (ref 8.9–10.3)
Chloride: 90 mmol/L — ABNORMAL LOW (ref 98–111)
Creatinine, Ser: 2.08 mg/dL — ABNORMAL HIGH (ref 0.61–1.24)
GFR calc Af Amer: 37 mL/min — ABNORMAL LOW (ref >60)
GFR calc non Af Amer: 32 mL/min — ABNORMAL LOW (ref >60)
Glucose, Bld: 194 mg/dL — ABNORMAL HIGH (ref 70–99)
Potassium: 4.6 mmol/L (ref 3.5–5.1)
Sodium: 129 mmol/L — ABNORMAL LOW (ref 135–145)
Total Bilirubin: 0.8 mg/dL (ref 0.3–1.2)
Total Protein: 6.9 g/dL (ref 6.5–8.1)

## 2019-09-26 LAB — SARS CORONAVIRUS 2 BY RT PCR (HOSPITAL ORDER, PERFORMED IN ~~LOC~~ HOSPITAL LAB): SARS Coronavirus 2: NEGATIVE

## 2019-09-26 LAB — LIPID PANEL
Cholesterol: 91 mg/dL (ref 0–200)
HDL: 20 mg/dL — ABNORMAL LOW (ref >40)
LDL Cholesterol: 48 mg/dL (ref 0–99)
Total CHOL/HDL Ratio: 4.6 RATIO
Triglycerides: 113 mg/dL (ref <150)
VLDL: 23 mg/dL (ref 0–40)

## 2019-09-26 LAB — HEMOGLOBIN A1C
Hgb A1c MFr Bld: 9.2 % — ABNORMAL HIGH (ref 4.8–5.6)
Mean Plasma Glucose: 217.34 mg/dL

## 2019-09-26 LAB — D-DIMER, QUANTITATIVE: D-Dimer, Quant: 3.25 ug/mL-FEU — ABNORMAL HIGH (ref 0.00–0.50)

## 2019-09-26 LAB — LACTIC ACID, PLASMA: Lactic Acid, Venous: 1.8 mmol/L (ref 0.5–1.9)

## 2019-09-26 IMAGING — CR DG FOOT 2V*L*
2 series · 2 of 2 positions shown · non-contrast
Comparison: Left foot radiograph dated [DATE].

CLINICAL DATA: 68-year-old male with foot infection and necrotic
toe.

EXAM:
LEFT FOOT - 2 VIEW

[foot ap]
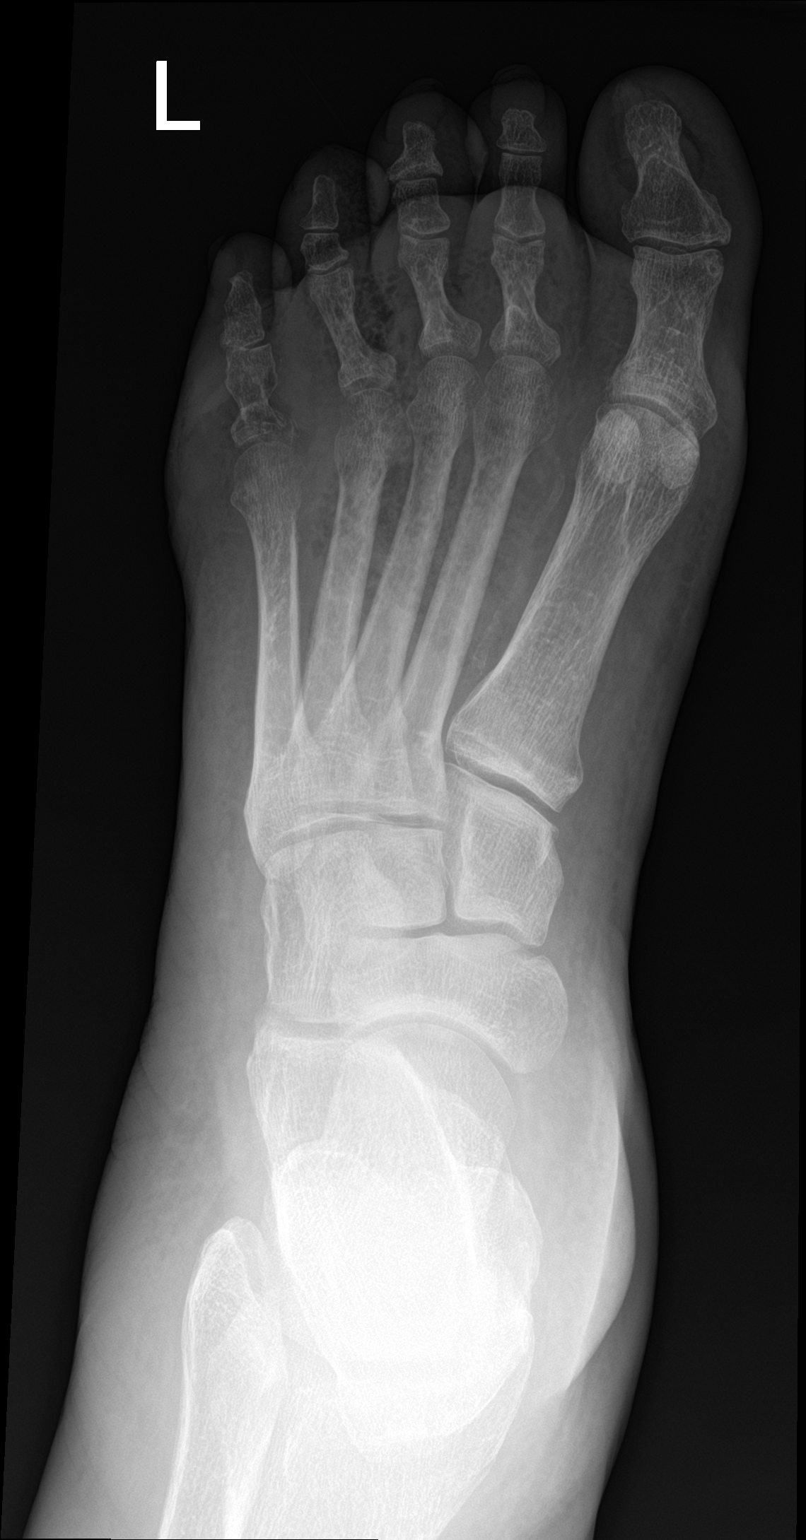

[foot lat]
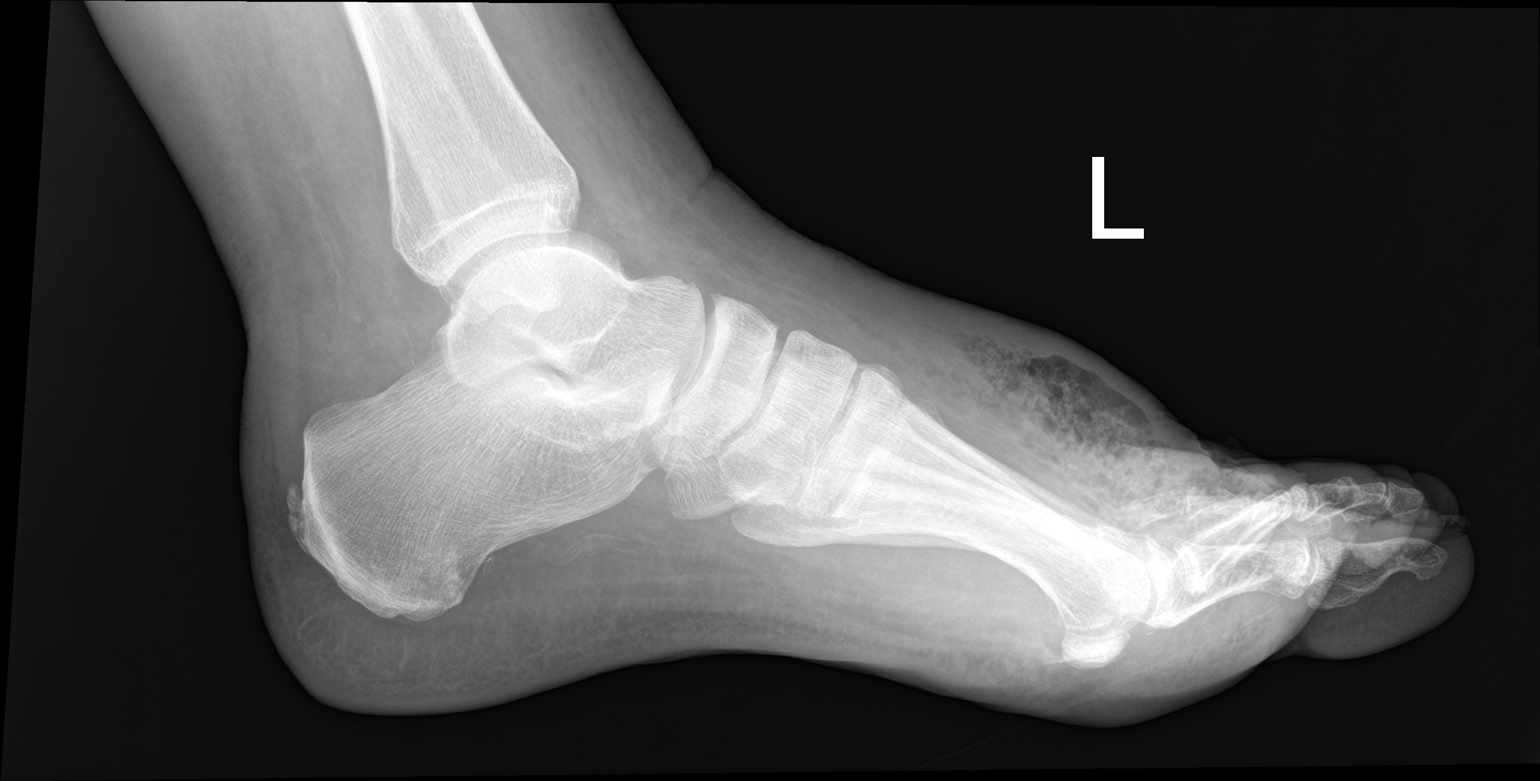

[2 of 2 positions shown; findings below may reference images not displayed]

FINDINGS: There is fracture of the base of the proximal phalanx of the fifth
digit which may represent a pathologic fracture secondary to
osteomyelitis. Clinical correlation is recommended. No other acute
fracture identified. There is no dislocation. The bones are
osteopenic. There is diffuse subcutaneous soft tissue edema and soft
tissue swelling of the forefoot with large pockets of soft tissue
air along the dorsum of the forefoot most concerning for necrotizing
fasciitis.
IMPRESSION: 1. Fracture of the base of the proximal phalanx of the fifth digit.
2. soft tissue swelling of the forefoot with findings of necrotizing
fasciitis.

## 2019-09-26 IMAGING — CR DG CHEST 2V
2 series · 2 of 2 positions shown · non-contrast
Comparison: Radiograph [DATE]

CLINICAL DATA: Fever

EXAM:
CHEST - 2 VIEW

[chest lat]
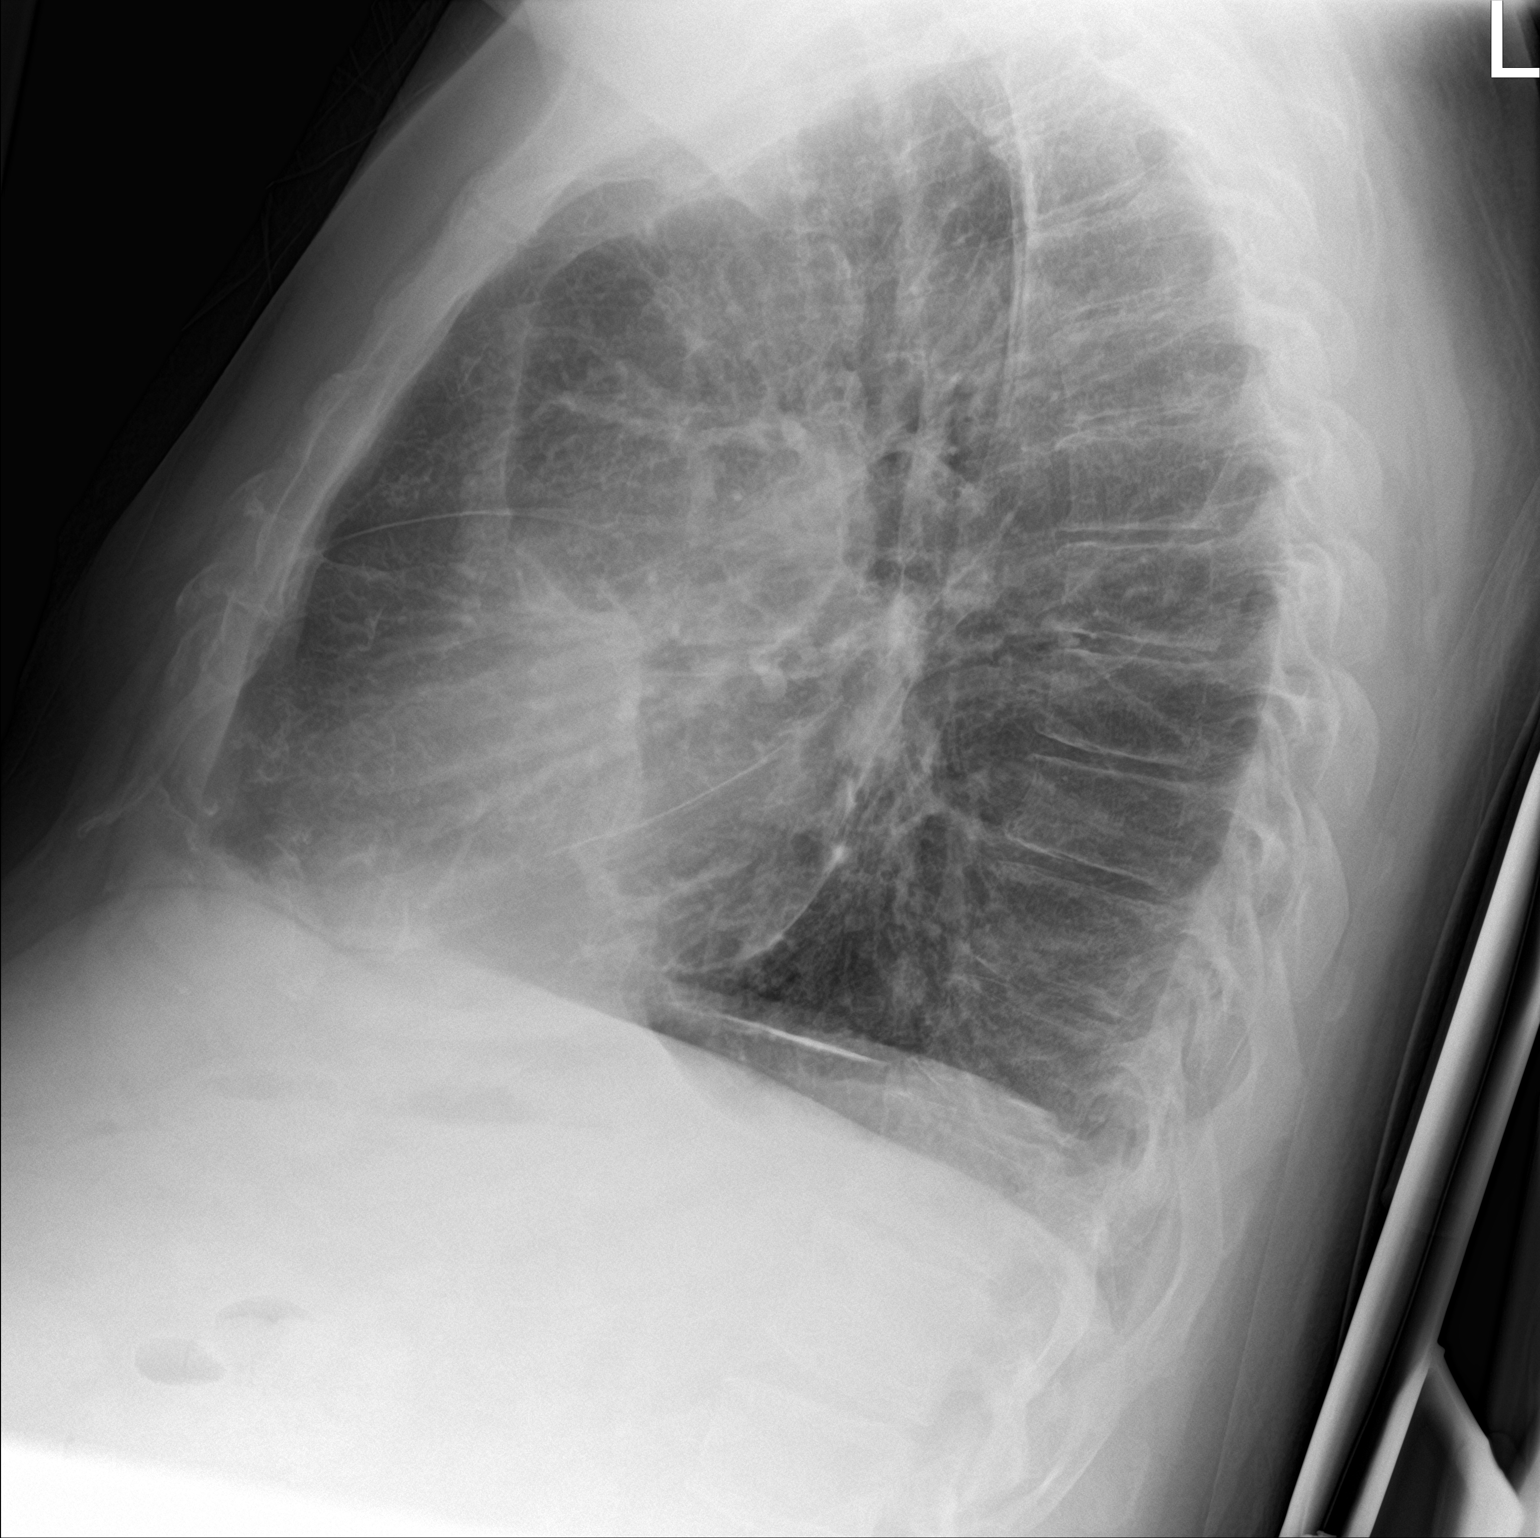

[chest ap]
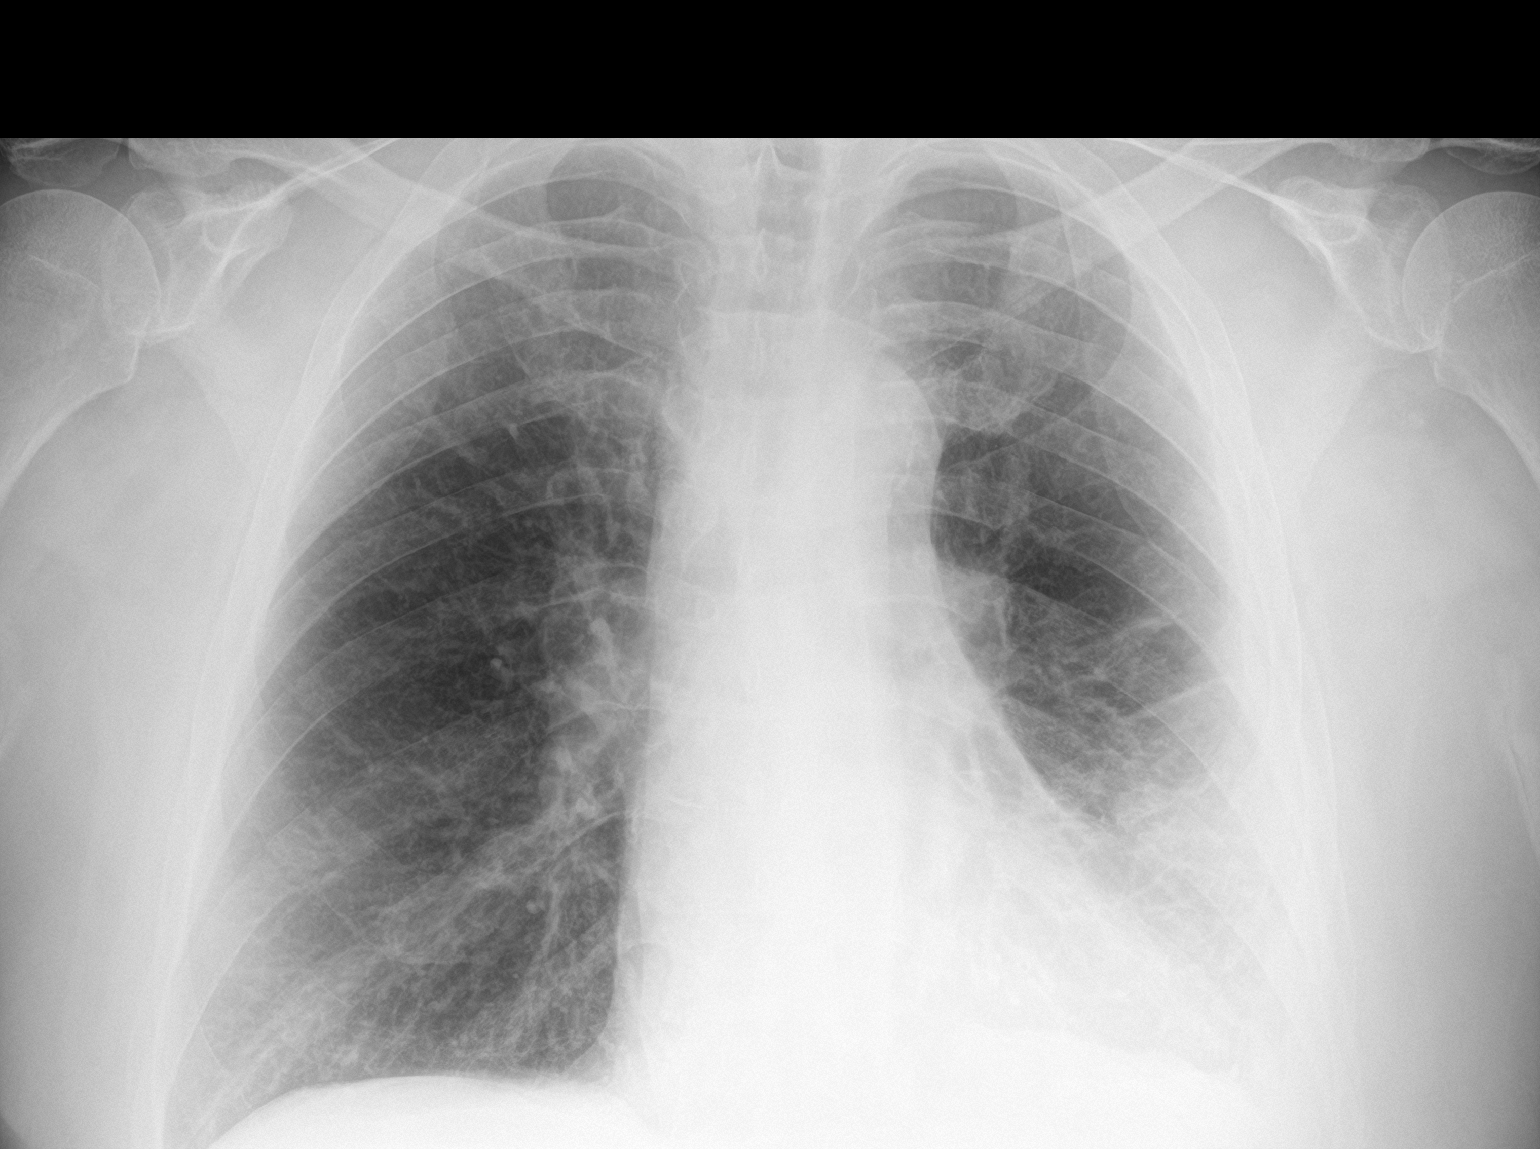

[2 of 2 positions shown; findings below may reference images not displayed]

FINDINGS: Stable cardiac silhouette. Prominent epicardial fat pad. Lungs are
hyperinflated. No effusion, infiltrate, or pneumothorax.
IMPRESSION: Hyperinflated lungs.  No acute findings

## 2019-09-26 MED ORDER — INSULIN ASPART 100 UNIT/ML ~~LOC~~ SOLN
0.0000 [IU] | Freq: Every day | SUBCUTANEOUS | Status: DC
  Administered 2019-09-27 – 2019-09-29 (×3): 2 [IU] via SUBCUTANEOUS
  Administered 2019-10-01: 3 [IU] via SUBCUTANEOUS
  Administered 2019-10-04 – 2019-10-08 (×3): 2 [IU] via SUBCUTANEOUS

## 2019-09-26 MED ORDER — PIPERACILLIN-TAZOBACTAM 3.375 G IVPB
3.3750 g | Freq: Three times a day (TID) | INTRAVENOUS | Status: AC
  Administered 2019-09-26 – 2019-10-02 (×17): 3.375 g via INTRAVENOUS
  Filled 2019-09-26 (×17): qty 50

## 2019-09-26 MED ORDER — HYDROMORPHONE HCL 1 MG/ML IJ SOLN
0.5000 mg | INTRAMUSCULAR | Status: DC | PRN
  Administered 2019-09-26 – 2019-09-27 (×5): 0.5 mg via INTRAVENOUS
  Filled 2019-09-26 (×5): qty 1

## 2019-09-26 MED ORDER — HEPARIN SODIUM (PORCINE) 5000 UNIT/ML IJ SOLN: 5000.0000 [IU] | Freq: Three times a day (TID) | INTRAMUSCULAR | Status: DC

## 2019-09-26 MED ORDER — HEPARIN BOLUS VIA INFUSION
6100.0000 [IU] | Freq: Once | INTRAVENOUS | Status: AC
  Administered 2019-09-26: 6100 [IU] via INTRAVENOUS
  Filled 2019-09-26: qty 6100

## 2019-09-26 MED ORDER — FUROSEMIDE 20 MG PO TABS: 40.0000 mg | ORAL_TABLET | Freq: Every day | ORAL | Status: DC

## 2019-09-26 MED ORDER — LACTATED RINGERS IV BOLUS
2500.0000 mL | Freq: Once | INTRAVENOUS | Status: AC
  Administered 2019-09-26: 2500 mL via INTRAVENOUS

## 2019-09-26 MED ORDER — CLINDAMYCIN PHOSPHATE 600 MG/50ML IV SOLN
600.0000 mg | Freq: Three times a day (TID) | INTRAVENOUS | Status: DC
  Administered 2019-09-26 – 2019-09-27 (×2): 600 mg via INTRAVENOUS
  Filled 2019-09-26 (×3): qty 50

## 2019-09-26 MED ORDER — SODIUM CHLORIDE 0.9 % IV SOLN: INTRAVENOUS | Status: DC

## 2019-09-26 MED ORDER — LACTATED RINGERS IV BOLUS
1000.0000 mL | Freq: Once | INTRAVENOUS | Status: AC
  Administered 2019-09-26: 1000 mL via INTRAVENOUS

## 2019-09-26 MED ORDER — ASPIRIN EC 81 MG PO TBEC
81.0000 mg | DELAYED_RELEASE_TABLET | Freq: Every day | ORAL | Status: DC
  Administered 2019-09-28 – 2019-10-02 (×5): 81 mg via ORAL
  Filled 2019-09-26 (×5): qty 1

## 2019-09-26 MED ORDER — ACETAMINOPHEN 325 MG PO TABS
650.0000 mg | ORAL_TABLET | Freq: Four times a day (QID) | ORAL | Status: DC | PRN
  Administered 2019-09-26 – 2019-09-28 (×2): 650 mg via ORAL
  Filled 2019-09-26 (×2): qty 2

## 2019-09-26 MED ORDER — VANCOMYCIN HCL 1750 MG/350ML IV SOLN
1750.0000 mg | Freq: Once | INTRAVENOUS | Status: AC
  Administered 2019-09-26: 1750 mg via INTRAVENOUS
  Filled 2019-09-26: qty 350

## 2019-09-26 MED ORDER — PIPERACILLIN-TAZOBACTAM 3.375 G IVPB 30 MIN
3.3750 g | Freq: Once | INTRAVENOUS | Status: AC
  Administered 2019-09-26: 3.375 g via INTRAVENOUS
  Filled 2019-09-26: qty 50

## 2019-09-26 MED ORDER — SODIUM CHLORIDE 0.9% FLUSH
3.0000 mL | Freq: Once | INTRAVENOUS | Status: AC
  Administered 2019-09-26: 3 mL via INTRAVENOUS

## 2019-09-26 MED ORDER — ATORVASTATIN CALCIUM 40 MG PO TABS
40.0000 mg | ORAL_TABLET | Freq: Every day | ORAL | Status: DC
  Administered 2019-09-27 – 2019-10-02 (×6): 40 mg via ORAL
  Filled 2019-09-26 (×6): qty 1

## 2019-09-26 MED ORDER — INSULIN ASPART 100 UNIT/ML ~~LOC~~ SOLN
0.0000 [IU] | Freq: Three times a day (TID) | SUBCUTANEOUS | Status: DC
  Administered 2019-09-27 – 2019-09-28 (×3): 2 [IU] via SUBCUTANEOUS
  Administered 2019-09-28 (×2): 3 [IU] via SUBCUTANEOUS
  Administered 2019-09-29: 5 [IU] via SUBCUTANEOUS
  Administered 2019-09-29: 3 [IU] via SUBCUTANEOUS
  Administered 2019-09-29 – 2019-09-30 (×2): 5 [IU] via SUBCUTANEOUS
  Administered 2019-09-30 – 2019-10-01 (×3): 3 [IU] via SUBCUTANEOUS
  Administered 2019-10-01 (×2): 2 [IU] via SUBCUTANEOUS
  Administered 2019-10-02 – 2019-10-04 (×9): 3 [IU] via SUBCUTANEOUS
  Administered 2019-10-05 (×2): 5 [IU] via SUBCUTANEOUS
  Administered 2019-10-06 – 2019-10-07 (×6): 3 [IU] via SUBCUTANEOUS
  Administered 2019-10-08: 2 [IU] via SUBCUTANEOUS
  Administered 2019-10-08: 5 [IU] via SUBCUTANEOUS
  Administered 2019-10-08 – 2019-10-09 (×4): 3 [IU] via SUBCUTANEOUS

## 2019-09-26 MED ORDER — FENTANYL CITRATE (PF) 100 MCG/2ML IJ SOLN
50.0000 ug | Freq: Once | INTRAMUSCULAR | Status: AC
  Administered 2019-09-26: 50 ug via INTRAVENOUS
  Filled 2019-09-26: qty 2

## 2019-09-26 MED ORDER — HEPARIN (PORCINE) 25000 UT/250ML-% IV SOLN
1900.0000 [IU]/h | INTRAVENOUS | Status: DC
  Administered 2019-09-26: 1700 [IU]/h via INTRAVENOUS
  Administered 2019-09-27: 1900 [IU]/h via INTRAVENOUS
  Filled 2019-09-26 (×2): qty 250

## 2019-09-26 MED ORDER — VANCOMYCIN VARIABLE DOSE PER UNSTABLE RENAL FUNCTION (PHARMACIST DOSING): Status: DC

## 2019-09-26 MED ORDER — ACETAMINOPHEN 650 MG RE SUPP: 650.0000 mg | Freq: Four times a day (QID) | RECTAL | Status: DC | PRN

## 2019-09-26 NOTE — ED Notes (Signed)
MD messaged about stronger PRN pain medication

## 2019-09-26 NOTE — ED Provider Notes (Signed)
[facility] EMERGENCY DEPARTMENT Provider Note   CSN: [id number] Arrival date & time: 09/26/19  1422     History No chief complaint on file.   Rodney [name] is a 69 y.o. male.  69 yo M with a cc of L foot pain.  Going on for the past week, worsening over the past couple days.  No fevers.  Seen at [facility] doc office and sent here for IV antibiotics.   The history is provided by the patient.  Illness Severity:  Severe Onset quality:  Gradual Duration:  2 weeks Timing:  Constant Progression:  Worsening Chronicity:  New Associated symptoms: no abdominal pain, no chest pain, no congestion, no diarrhea, no fever, no headaches, no myalgias, no rash, no shortness of breath and no vomiting        History reviewed. No pertinent past medical history.  Patient Active Problem List   Diagnosis Date Noted  . 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 Severe protein-calorie malnutrition ([place])   . PVD (peripheral vascular disease) ([place])   . Cellulitis 09/22/2019  . Foot ulcer ([place]) 09/22/2019  . Dyspnea 07/26/2018  . Bilateral otitis media 06/22/2018  . Peripheral edema 02/24/2018  . Umbilical hernia 09/32/6712  . Obesity 10/09/2015  . Tobacco abuse 09/07/2013  . Uncontrolled type 2 diabetes mellitus with diabetic neuropathy, with long-term current use of insulin ([place]) 09/07/2013  . Colon cancer screening 09/07/2013  . Hypercholesteremia 10/28/2010  . ERECTILE DYSFUNCTION 05/22/2009  . HYPERTENSION 01/23/2009    History reviewed. No pertinent surgical history.     No family history on file.  Social History   Tobacco Use  . Smoking status: Current Every Day Smoker    Packs/day: 0.50    Types: Cigarettes  . Smokeless tobacco: Never Used  Substance Use Topics  . Alcohol use: Yes    Alcohol/week: 6.0 standard drinks    Types: 6 drink(s) per week  . Drug use: No    Home Medications Prior to Admission medications   Medication Sig  Start Date End Date Taking? Authorizing Provider  aspirin 81 MG tablet Take 81 mg by mouth daily.     Yes [provider]  atorvastatin (LIPITOR) 40 MG tablet TAKE 1 TABLET (40 MG TOTAL) BY MOUTH AT BEDTIME. Patient taking differently: Take 40 mg by mouth daily.  12/19/18  Yes Hensel, Jamal Collin, MD  furosemide (LASIX) 40 MG tablet Take 1 tablet by mouth once daily Patient taking differently: Take 40 mg by mouth daily.  08/02/19  Yes Hensel, Jamal Collin, MD  glucose blood (ACCU-CHEK AVIVA) test strip Use as instructed Patient taking differently: 1 each by Other route as directed.  04/14/17  Yes Hensel, Jamal Collin, MD  insulin NPH-regular Human (NOVOLIN 70/30 RELION) (70-30) 100 UNIT/ML injection 70 units every morning and 50 units every evening. Patient taking differently: Inject 50-70 Units into the skin See admin instructions. Inject 75 units every morning and 50 units every evening. 10/25/18  Yes Hensel, Jamal Collin, MD  lisinopril-hydrochlorothiazide (ZESTORETIC) 20-25 MG tablet TAKE 1 TABLET EVERY DAY Patient taking differently: Take 1 tablet by mouth daily.  12/19/18  Yes Hensel, Jamal Collin, MD  metFORMIN (GLUCOPHAGE) 1000 MG tablet TAKE 1 TABLET (1,000 MG TOTAL) BY MOUTH 2 (TWO) TIMES DAILY WITH A MEAL. 12/19/18  Yes Hensel, Jamal Collin, MD  sulfamethoxazole-trimethoprim (BACTRIM DS) 800-160 MG tablet Take 1 tablet by mouth 2 (two) times daily. 09/21/19  Yes Guadalupe Dawn, MD  Allergies    Patient has no known allergies.  Review of Systems   Review of Systems  Constitutional: Negative for chills and fever.  HENT: Negative for congestion and facial swelling.   Eyes: Negative for discharge and visual disturbance.  Respiratory: Negative for shortness of breath.   Cardiovascular: Negative for chest pain and palpitations.  Gastrointestinal: Negative for abdominal pain, diarrhea and vomiting.  Musculoskeletal: Negative for arthralgias and myalgias.  Skin: Positive for wound. Negative for  color change and rash.  Neurological: Negative for tremors, syncope and headaches.  Psychiatric/Behavioral: Negative for confusion and dysphoric mood.    Physical Exam Updated Vital Signs BP 98/71   Pulse (!) 130   Temp 99 F (37.2 C) (Oral)   Resp (!) 30   Ht 6' (1.829 m)   Wt 116.6 kg   SpO2 97%   BMI 34.86 kg/m   Physical Exam Vitals and nursing note reviewed.  Constitutional:      Appearance: He is well-developed.  HENT:     Head: Normocephalic and atraumatic.  Eyes:     Pupils: Pupils are equal, round, and reactive to light.  Neck:     Vascular: No JVD.  Cardiovascular:     Rate and Rhythm: Regular rhythm. Tachycardia present.     Heart sounds: No murmur. No friction rub. No gallop.   Pulmonary:     Effort: No respiratory distress.     Breath sounds: No wheezing.  Abdominal:     General: There is no distension.     Tenderness: There is no guarding or rebound.  Musculoskeletal:        General: Normal range of motion.     Cervical back: Normal range of motion and neck supple.     Comments: Erythema and tenderness to the left lower extremity about the foot.  Left fourth toe is necrotic painful on palpation.  Intact pulses.  Skin:    Coloration: Skin is not pale.     Findings: No rash.  Neurological:     Mental Status: He is alert and oriented to person, place, and time.  Psychiatric:        Behavior: Behavior normal.     ED Results / Procedures / Treatments   Labs (all labs ordered are listed, but only abnormal results are displayed) Labs Reviewed  COMPREHENSIVE METABOLIC PANEL - Abnormal; Notable for the following components:      Result Value   Sodium 129 (*)    Chloride 90 (*)    Glucose, Bld 194 (*)    BUN 64 (*)    Creatinine, Ser 2.08 (*)    Albumin 2.2 (*)    AST 53 (*)    ALT 71 (*)    Alkaline Phosphatase 198 (*)    GFR calc non Af Amer 32 (*)    GFR calc Af Amer 37 (*)    All other components within normal limits  CBC WITH  DIFFERENTIAL/PLATELET - Abnormal; Notable for the following components:   WBC 17.4 (*)    RBC 3.40 (*)    Hemoglobin 10.1 (*)    HCT 30.7 (*)    Neutro Abs 14.8 (*)    Abs Immature Granulocytes 0.20 (*)    All other components within normal limits  LIPID PANEL - Abnormal; Notable for the following components:   HDL 20 (*)    All other components within normal limits  HEMOGLOBIN A1C - Abnormal; Notable for the following components:   Hgb A1c MFr Bld  9.2 (*)    All other components within normal limits  SARS CORONAVIRUS 2 BY RT PCR (HOSPITAL ORDER, [facility])  CULTURE, BLOOD (ROUTINE X 2)  CULTURE, BLOOD (ROUTINE X 2)  URINALYSIS, ROUTINE W REFLEX MICROSCOPIC  LACTIC ACID, PLASMA  LACTIC ACID, PLASMA  HIV ANTIBODY (ROUTINE TESTING W REFLEX)  BASIC METABOLIC PANEL  CBC  D-DIMER, QUANTITATIVE (NOT AT [facility])    EKG None  Radiology DG Chest 2 View  Result Date: 09/26/2019 CLINICAL DATA:  Fever EXAM: CHEST - 2 VIEW COMPARISON:  Radiograph 01/18/2009 FINDINGS: Stable cardiac silhouette. Prominent epicardial fat pad. Lungs are hyperinflated. No effusion, infiltrate, or pneumothorax. IMPRESSION: Hyperinflated lungs.  No acute findings Electronically Signed   By: Suzy Bouchard M.D.   On: 09/26/2019 15:16   DG Foot 2 Views Left  Result Date: 09/26/2019 CLINICAL DATA:  69 year old male with foot infection and necrotic toe. EXAM: LEFT FOOT - 2 VIEW COMPARISON:  Left foot radiograph dated 09/21/2019. FINDINGS: There is fracture of the base of the proximal phalanx of the fifth digit which may represent a pathologic fracture secondary to osteomyelitis. Clinical correlation is recommended. No other acute fracture identified. There is no dislocation. The bones are osteopenic. There is diffuse subcutaneous soft tissue edema and soft tissue swelling of the forefoot with large pockets of soft tissue air along the dorsum of the forefoot most concerning for necrotizing  fasciitis. IMPRESSION: 1. Fracture of the base of the proximal phalanx of the fifth digit. 2. 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 Electronically Signed   By: Anner Crete M.D.   On: 09/26/2019 16:01    Procedures Procedures (including critical care time)  Medications Ordered in ED Medications  aspirin EC tablet 81 mg (has no administration in time range)  atorvastatin (LIPITOR) tablet 40 mg (has no administration in time range)  acetaminophen (TYLENOL) tablet 650 mg (650 mg Oral Refused 09/26/19 1913)    Or  acetaminophen (TYLENOL) suppository 650 mg ( Rectal See Alternative 09/26/19 1913)  insulin aspart (novoLOG) injection 0-15 Units (has no administration in time range)  insulin aspart (novoLOG) injection 0-5 Units (has no administration in time range)  0.9 %  sodium chloride infusion ( Intravenous New Bag/Given 09/26/19 2032)  clindamycin (CLEOCIN) IVPB 600 mg (has no administration in time range)  HYDROmorphone (DILAUDID) injection 0.5 mg (0.5 mg Intravenous Given 09/26/19 2033)  piperacillin-tazobactam (ZOSYN) IVPB 3.375 g (has no administration in time range)  vancomycin variable dose per unstable renal function (pharmacist dosing) (has no administration in time range)  sodium chloride flush (NS) 0.9 % injection 3 mL (3 mLs Intravenous Given 09/26/19 1523)  lactated ringers bolus 1,000 mL (0 mLs Intravenous Stopped 09/26/19 1708)  piperacillin-tazobactam (ZOSYN) IVPB 3.375 g (0 g Intravenous Stopped 09/26/19 1708)  fentaNYL (SUBLIMAZE) injection 50 mcg (50 mcg Intravenous Given 09/26/19 1604)  lactated ringers bolus 2,500 mL (0 mLs Intravenous Stopped 09/26/19 1840)  vancomycin (VANCOREADY) IVPB 1750 mg/350 mL (0 mg Intravenous Stopped 09/26/19 1820)    ED Course  I have reviewed the triage vital signs and the nursing notes.  Pertinent labs & imaging results that were available during my care of the patient were reviewed by me and considered in my  medical decision making (see chart for details).    MDM Rules/Calculators/A&P                      69 yo M with a chief  complaints of a necrotic toe on his left foot.  Has some erythematous extension of the leg.  Borderline hypotensive and tachycardic here.  Afebrile reportedly at home temperature here is 99 orally.  Will give broad-spectrum antibiotics check lab work.  I discussed with orthopedics who will evaluate at bedside.  CRITICAL CARE Performed by: Cecilio Asper   Total critical care time: 35 minutes  Critical care time was exclusive of separately billable procedures and treating other patients.  Critical care was necessary to treat or prevent imminent or life-threatening deterioration.  Critical care was time spent personally by me on the following activities: development of treatment plan with patient and/or surrogate as well as nursing, discussions with consultants, evaluation of patient's response to treatment, examination of patient, obtaining history from patient or surrogate, ordering and performing treatments and interventions, ordering and review of laboratory studies, ordering and review of radiographic studies, pulse oximetry and re-evaluation of patient's condition.  The patients results and plan were reviewed and discussed.   Any x-rays performed were independently reviewed by myself.   Differential diagnosis were considered with the presenting HPI.  Medications  aspirin EC tablet 81 mg (has no administration in time range)  atorvastatin (LIPITOR) tablet 40 mg (has no administration in time range)  acetaminophen (TYLENOL) tablet 650 mg (650 mg Oral Refused 09/26/19 1913)    Or  acetaminophen (TYLENOL) suppository 650 mg ( Rectal See Alternative 09/26/19 1913)  insulin aspart (novoLOG) injection 0-15 Units (has no administration in time range)  insulin aspart (novoLOG) injection 0-5 Units (has no administration in time range)  0.9 %  sodium chloride infusion (  Intravenous New Bag/Given 09/26/19 2032)  clindamycin (CLEOCIN) IVPB 600 mg (has no administration in time range)  HYDROmorphone (DILAUDID) injection 0.5 mg (0.5 mg Intravenous Given 09/26/19 2033)  piperacillin-tazobactam (ZOSYN) IVPB 3.375 g (has no administration in time range)  vancomycin variable dose per unstable renal function (pharmacist dosing) (has no administration in time range)  sodium chloride flush (NS) 0.9 % injection 3 mL (3 mLs Intravenous Given 09/26/19 1523)  lactated ringers bolus 1,000 mL (0 mLs Intravenous Stopped 09/26/19 1708)  piperacillin-tazobactam (ZOSYN) IVPB 3.375 g (0 g Intravenous Stopped 09/26/19 1708)  fentaNYL (SUBLIMAZE) injection 50 mcg (50 mcg Intravenous Given 09/26/19 1604)  lactated ringers bolus 2,500 mL (0 mLs Intravenous Stopped 09/26/19 1840)  vancomycin (VANCOREADY) IVPB 1750 mg/350 mL (0 mg Intravenous Stopped 09/26/19 1820)    Vitals:   09/26/19 1945 09/26/19 2045 09/26/19 2126 09/26/19 2130  BP: 108/76 99/67 97/70  98/71  Pulse: (!) 133 (!) 132 (!) 132 (!) 130  Resp: (!) 30 20 (!) 24 (!) 30  Temp:      TempSrc:      SpO2: 93% 99% 98% 97%  Weight:      Height:        Final diagnoses:  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

## 2019-09-26 NOTE — ED Triage Notes (Signed)
Patient with left lower leg and foot infection that has worsened over the past week. Drainage from great toe and bottom of foot. Swelling and redness to same with great toe black in color. Sent from MD office for admission.

## 2019-09-26 NOTE — ED Notes (Signed)
Ortho at [employer]

## 2019-09-26 NOTE — ED Notes (Signed)
This RN updated pt's vital signs, found rectal temp to be 99.69F. Pt given 650mg  of tylenol for same. This RN called [facility] back, spoke to charge RN Blanch Media, updating her on pt's likely cause of tachycardia/concern. Charge RN Blanch Media advised this RN that she had contacted [facility] with regard to pt placement & was waiting to hear back from [facility]. This RN gave callback number [phone]) & advised that [facility] would receive call from this RN by 10:45 if no verdict yet reached.

## 2019-09-26 NOTE — Progress Notes (Signed)
[place] for Heparin Indication: R/o PE awaiting VQ scan   No Known Allergies  Patient Measurements: Height: 6' (182.9 cm) Weight: 116.6 kg (257 lb) IBW/kg (Calculated) : 77.6 Heparin Dosing Weight: 102 kg  Vital Signs: Temp: 99 F (37.2 C) (06/01 1443) Temp Source: Oral (06/01 1443) BP: 98/69 (06/01 2145) Pulse Rate: 131 (06/01 2145)  Labs: Recent Labs    09/26/19 1449  HGB 10.1*  HCT 30.7*  PLT 351  CREATININE 2.08*    Estimated Creatinine Clearance: 45.4 mL/min (A) (by C-G formula based on SCr of 2.08 mg/dL (H)).   Medical History: History reviewed. No pertinent past medical history.  Medications:  Scheduled:  . [START ON 09/27/2019] aspirin EC  81 mg Oral Daily  . [START ON 09/27/2019] atorvastatin  40 mg Oral QHS  . heparin  6,100 Units Intravenous Once  . [START ON 09/27/2019] insulin aspart  0-15 Units Subcutaneous TID WC  . insulin aspart  0-5 Units Subcutaneous QHS  . vancomycin variable dose per unstable renal function (pharmacist dosing)   Does not apply See admin instructions    Assessment: Patient is a 49 yom that presented to the ED with a Left foot cellulitis with concerns for Nec Fasc. Patient was persistently tachy and requiring supplemental 02. With concern for a PE and elevated D-Dimer. Pharmacy has been asked to dose heparin at this time until able to r/o PE with VQ scan. Patient planned for a BKA tomorrow and Heparin will need to be stopped at least 2 hours prior to procedure.   Goal of Therapy:  Heparin level 0.3-0.7 units/ml Monitor platelets by anticoagulation protocol: Yes   Plan:  - Heparin bolus 6100 units IV x 1 dose - Heparin drip @ 1700 units/hr - Heparin level in ~ 6 hours  - Monitor patient for s/s of bleeding and CBC while on heparin   Duanne Limerick PharmD. BCPS  09/26/2019,10:01 PM

## 2019-09-26 NOTE — ED Notes (Signed)
Pt to be admitted Tele- this RN called Pt Placement for appropriate placement. Advised that room will stay the same because pt is not cardiac telemetry/ actively having chest pain.

## 2019-09-26 NOTE — ED Notes (Addendum)
Admitting DO's responded to page in person, to assess pt & reassign admission bed.

## 2019-09-26 NOTE — Progress Notes (Signed)
Pharmacy Antibiotic Note  Rodney [name] is a 69 y.o. male admitted on 09/26/2019 with Osteo with concerns for nec fascitis .  Pharmacy has been consulted for Zosyn and Vancomycin dosing.   Height: 6' (182.9 cm) Weight: 116.6 kg (257 lb) IBW/kg (Calculated) : 77.6  Temp (24hrs), Avg:99 F (37.2 C), Min:99 F (37.2 C), Max:99 F (37.2 C)  Recent Labs  Lab 09/26/19 1449 09/26/19 1510  WBC 17.4*  --   CREATININE 2.08*  --   LATICACIDVEN  --  1.8    Estimated Creatinine Clearance: 45.4 mL/min (A) (by C-G formula based on SCr of 2.08 mg/dL (H)).    No Known Allergies  Antimicrobials this admission: 6/1 Zosyn >>  6/1 Vancomycin >>   Dose adjustments this admission: N/a  Microbiology results: Pending   Plan:  - Zosyn 3.375g IV x 1 dose over 30 min followed by Zosyn 3.375g IV every 8 hours infused over 4 hours  - Vancomycin 1750 mg IV x 1 dose  - Recommend AM team considering switching to Zyvox 2/2 AKI and reagent shortage. If done could also d/c Clindamycin as Zyvox has antitoxin affects as well.  - Monitor patients renal function and urine output  - De-escalate ABX when appropriate   Thank you for allowing pharmacy to be a part of this patient's care.  Duanne Limerick PharmD. BCPS 09/26/2019 8:32 PM

## 2019-09-26 NOTE — H&P (View-Only) (Signed)
ORTHOPAEDIC CONSULTATION  REQUESTING PHYSICIAN: Deno Etienne, DO  Chief Complaint: Cellulitis abscess ulceration and pain left foot.  HPI: Rodney [name] is a 69 y.o. 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  Patient states the infection seemed to have started a week and a half ago.  History reviewed. No pertinent past medical history. History reviewed. No pertinent surgical history. Social History   Socioeconomic History   Marital status: Widowed    Spouse name: Not on file   Number of children: Not on file   Years of education: Not on file   Highest education level: Not on file  Occupational History   Not on file  Tobacco Use   Smoking status: Current Every Day Smoker    Packs/day: 0.50    Types: Cigarettes   Smokeless tobacco: Never Used  Substance and Sexual Activity   Alcohol use: Yes    Alcohol/week: 6.0 standard drinks    Types: 6 drink(s) per week   Drug use: No   Sexual activity: Yes    Partners: Female    Comment: monagamous stable relationship  Other Topics Concern   Not on file  Social History Narrative   Not on file   Social Determinants of Health   Financial Resource Strain:    Difficulty of Paying Living Expenses:   Food Insecurity:    Worried About Charity fundraiser in the Last Year:    Arboriculturist in the Last Year:   Transportation Needs:    Film/video editor (Medical):    Lack of Transportation (Non-Medical):   Physical Activity:    Days of Exercise per Week:    Minutes of Exercise per Session:   Stress:    Feeling of Stress :   Social Connections:    Frequency of Communication with Friends and Family:    Frequency of Social Gatherings with Friends and Family:    Attends Religious Services:    Active Member of Clubs or Organizations:    Attends Archivist Meetings:    Marital Status:    No family  history on file. - negative except otherwise stated in the family history section No Known Allergies Prior to Admission medications   Medication Sig Start Date End Date Taking? Authorizing Provider  aspirin 81 MG tablet Take 81 mg by mouth daily.      [provider]  atorvastatin (LIPITOR) 40 MG tablet TAKE 1 TABLET (40 MG TOTAL) BY MOUTH AT BEDTIME. 12/19/18   Hensel, Jamal Collin, MD  furosemide (LASIX) 40 MG tablet Take 1 tablet by mouth once daily 08/02/19   [name], MD  glucose blood (ACCU-CHEK AVIVA) test strip Use as instructed 04/14/17   [name], MD  insulin NPH-regular Human (NOVOLIN 70/30 RELION) (70-30) 100 UNIT/ML injection 70 units every morning and 50 units every evening. 10/25/18   [name], MD  lisinopril-hydrochlorothiazide (ZESTORETIC) 20-25 MG tablet TAKE 1 TABLET EVERY DAY 12/19/18   [name], MD  metFORMIN (GLUCOPHAGE) 1000 MG tablet TAKE 1 TABLET (1,000 MG TOTAL) BY MOUTH 2 (TWO) TIMES DAILY WITH A MEAL. 12/19/18   [name], MD  sulfamethoxazole-trimethoprim (BACTRIM DS) 800-160 MG tablet Take 1 tablet by mouth 2 (two) times daily. 09/21/19   Guadalupe Dawn, MD   DG Chest 2 View  Result Date: 09/26/2019 CLINICAL DATA:  Fever EXAM: CHEST - 2 VIEW COMPARISON:  Radiograph  01/18/2009 FINDINGS: Stable cardiac silhouette. Prominent epicardial fat pad. Lungs are hyperinflated. No effusion, infiltrate, or pneumothorax. IMPRESSION: Hyperinflated lungs.  No acute findings Electronically Signed   By: Suzy Bouchard M.D.   On: 09/26/2019 15:16   DG Foot 2 Views Left  Result Date: 09/26/2019 CLINICAL DATA:  69 year old male with foot infection and necrotic toe. EXAM: LEFT FOOT - 2 VIEW COMPARISON:  Left foot radiograph dated 09/21/2019. FINDINGS: There is fracture of the base of the proximal phalanx of the fifth digit which may represent a pathologic fracture secondary to osteomyelitis. Clinical correlation is recommended. No other acute  fracture identified. There is no dislocation. The bones are osteopenic. There is diffuse subcutaneous soft tissue edema and soft tissue swelling of the forefoot with large pockets of soft tissue air along the dorsum of the forefoot most concerning for necrotizing fasciitis. IMPRESSION: 1. Fracture of the base of the proximal phalanx of the fifth digit. 2. 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 Electronically Signed   By: Anner Crete M.D.   On: 09/26/2019 16:01   - pertinent xrays, CT, MRI studies were reviewed and independently interpreted  Positive ROS: All other systems have been reviewed and were otherwise negative with the exception of those mentioned in the HPI and as above.  Physical Exam: General: Alert, no acute distress Psychiatric: Patient is competent for consent with normal mood and affect Lymphatic: No axillary or cervical lymphadenopathy Cardiovascular: No pedal edema Respiratory: No cyanosis, no use of accessory musculature GI: No organomegaly, abdomen is soft and non-tender    Images:  @ENCIMAGES @  Labs:  Lab Results  Component Value Date   HGBA1C 8.7 (A) 09/21/2019   HGBA1C 9.0 (A) 06/22/2018   HGBA1C 11.0 (A) 02/21/2018   REPTSTATUS 01/26/2009 FINAL 01/20/2009   CULT NO GROWTH 5 DAYS 01/20/2009    Lab Results  Component Value Date   ALBUMIN 2.2 (L) 09/26/2019   ALBUMIN 3.9 02/21/2018   ALBUMIN 3.8 10/09/2015    Neurologic: Patient does not have protective sensation bilateral lower extremities.   MUSCULOSKELETAL:   Skin: Examination patient has necrotic skin to the fourth toe with cellulitis across the forefoot with purulent drainage from the base of the fourth toe.  I cannot palpate a pulse.  Patient has cellulitis that extends up to the ankle.  There is ecchymosis and bruising through the hindfoot.  Review of the radiographs shows area in the soft tissue up to the midfoot as well as calcified arteries out to  the forefoot.  Patient has uncontrolled type 2 diabetes with a hemoglobin A1c that is ranged from 11 to currently 8.7.  Patient has severe protein caloric malnutrition with an albumin of 2.2.  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  Plan: Patient is currently on IV antibiotics we will plan for urgent left below the knee amputation surgery tomorrow, risks and benefits were discussed including risk of a higher amputation the potential of mortality from this infection.  The patient states he understands and wished to proceed with planned transtibial amputation.  Discussed that patient most likely would need discharge to skilled nursing facility there is a possibility of discharge to inpatient rehab.  Patient's wife has elective surgery scheduled and she will not be able to care for him at home.  I will cancel the MRI scan of the left foot, patient does not have  foot salvage intervention options.  Thank you for the consult and the opportunity to see Mr. Zaydn Gutridge, [place] [phone] 5:41 PM

## 2019-09-26 NOTE — Consult Note (Addendum)
ORTHOPAEDIC CONSULTATION  REQUESTING PHYSICIAN: Deno Etienne, DO  Chief Complaint: Cellulitis abscess ulceration and pain left foot.  HPI: Rodney [name] is a 69 y.o. 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  Patient states the infection seemed to have started a week and a half ago.  History reviewed. No pertinent past medical history. History reviewed. No pertinent surgical history. Social History   Socioeconomic History  . Marital status: Widowed    Spouse name: Not on file  . Number of children: Not on file  . Years of education: Not on file  . Highest education level: Not on file  Occupational History  . Not on file  Tobacco Use  . Smoking status: Current Every Day Smoker    Packs/day: 0.50    Types: Cigarettes  . Smokeless tobacco: Never Used  Substance and Sexual Activity  . Alcohol use: Yes    Alcohol/week: 6.0 standard drinks    Types: 6 drink(s) per week  . Drug use: No  . Sexual activity: Yes    Partners: Female    Comment: monagamous stable relationship  Other Topics Concern  . Not on file  Social History Narrative  . Not on file   Social Determinants of Health   Financial Resource Strain:   . Difficulty of Paying Living Expenses:   Food Insecurity:   . Worried About Charity fundraiser in the Last Year:   . Arboriculturist in the Last Year:   Transportation Needs:   . Film/video editor (Medical):   Marland Kitchen Lack of Transportation (Non-Medical):   Physical Activity:   . Days of Exercise per Week:   . Minutes of Exercise per Session:   Stress:   . Feeling of Stress :   Social Connections:   . Frequency of Communication with Friends and Family:   . Frequency of Social Gatherings with Friends and Family:   . Attends Religious Services:   . Active Member of Clubs or Organizations:   . Attends Archivist Meetings:   Marland Kitchen Marital Status:    No family  history on file. - negative except otherwise stated in the family history section No Known Allergies Prior to Admission medications   Medication Sig Start Date End Date Taking? Authorizing Provider  aspirin 81 MG tablet Take 81 mg by mouth daily.      [provider]  atorvastatin (LIPITOR) 40 MG tablet TAKE 1 TABLET (40 MG TOTAL) BY MOUTH AT BEDTIME. 12/19/18   Hensel, Jamal Collin, MD  furosemide (LASIX) 40 MG tablet Take 1 tablet by mouth once daily 08/02/19   [name], MD  glucose blood (ACCU-CHEK AVIVA) test strip Use as instructed 04/14/17   [name], MD  insulin NPH-regular Human (NOVOLIN 70/30 RELION) (70-30) 100 UNIT/ML injection 70 units every morning and 50 units every evening. 10/25/18   [name], MD  lisinopril-hydrochlorothiazide (ZESTORETIC) 20-25 MG tablet TAKE 1 TABLET EVERY DAY 12/19/18   [name], MD  metFORMIN (GLUCOPHAGE) 1000 MG tablet TAKE 1 TABLET (1,000 MG TOTAL) BY MOUTH 2 (TWO) TIMES DAILY WITH A MEAL. 12/19/18   [name], MD  sulfamethoxazole-trimethoprim (BACTRIM DS) 800-160 MG tablet Take 1 tablet by mouth 2 (two) times daily. 09/21/19   Guadalupe Dawn, MD   DG Chest 2 View  Result Date: 09/26/2019 CLINICAL DATA:  Fever EXAM: CHEST - 2 VIEW COMPARISON:  Radiograph  01/18/2009 FINDINGS: Stable cardiac silhouette. Prominent epicardial fat pad. Lungs are hyperinflated. No effusion, infiltrate, or pneumothorax. IMPRESSION: Hyperinflated lungs.  No acute findings Electronically Signed   By: Suzy Bouchard M.D.   On: 09/26/2019 15:16   DG Foot 2 Views Left  Result Date: 09/26/2019 CLINICAL DATA:  69 year old male with foot infection and necrotic toe. EXAM: LEFT FOOT - 2 VIEW COMPARISON:  Left foot radiograph dated 09/21/2019. FINDINGS: There is fracture of the base of the proximal phalanx of the fifth digit which may represent a pathologic fracture secondary to osteomyelitis. Clinical correlation is recommended. No other acute  fracture identified. There is no dislocation. The bones are osteopenic. There is diffuse subcutaneous soft tissue edema and soft tissue swelling of the forefoot with large pockets of soft tissue air along the dorsum of the forefoot most concerning for necrotizing fasciitis. IMPRESSION: 1. Fracture of the base of the proximal phalanx of the fifth digit. 2. 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 Electronically Signed   By: Anner Crete M.D.   On: 09/26/2019 16:01   - pertinent xrays, CT, MRI studies were reviewed and independently interpreted  Positive ROS: All other systems have been reviewed and were otherwise negative with the exception of those mentioned in the HPI and as above.  Physical Exam: General: Alert, no acute distress Psychiatric: Patient is competent for consent with normal mood and affect Lymphatic: No axillary or cervical lymphadenopathy Cardiovascular: No pedal edema Respiratory: No cyanosis, no use of accessory musculature GI: No organomegaly, abdomen is soft and non-tender    Images:  @ENCIMAGES @  Labs:  Lab Results  Component Value Date   HGBA1C 8.7 (A) 09/21/2019   HGBA1C 9.0 (A) 06/22/2018   HGBA1C 11.0 (A) 02/21/2018   REPTSTATUS 01/26/2009 FINAL 01/20/2009   CULT NO GROWTH 5 DAYS 01/20/2009    Lab Results  Component Value Date   ALBUMIN 2.2 (L) 09/26/2019   ALBUMIN 3.9 02/21/2018   ALBUMIN 3.8 10/09/2015    Neurologic: Patient does not have protective sensation bilateral lower extremities.   MUSCULOSKELETAL:   Skin: Examination patient has necrotic skin to the fourth toe with cellulitis across the forefoot with purulent drainage from the base of the fourth toe.  I cannot palpate a pulse.  Patient has cellulitis that extends up to the ankle.  There is ecchymosis and bruising through the hindfoot.  Review of the radiographs shows area in the soft tissue up to the midfoot as well as calcified arteries out to  the forefoot.  Patient has uncontrolled type 2 diabetes with a hemoglobin A1c that is ranged from 11 to currently 8.7.  Patient has severe protein caloric malnutrition with an albumin of 2.2.  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  Plan: Patient is currently on IV antibiotics we will plan for urgent left below the knee amputation surgery tomorrow, risks and benefits were discussed including risk of a higher amputation the potential of mortality from this infection.  The patient states he understands and wished to proceed with planned transtibial amputation.  Discussed that patient most likely would need discharge to skilled nursing facility there is a possibility of discharge to inpatient rehab.  Patient's wife has elective surgery scheduled and she will not be able to care for him at home.  I will cancel the MRI scan of the left foot, patient does not have  foot salvage intervention options.  Thank you for the consult and the opportunity to see Rodney [name], [place] [phone] 5:41 PM

## 2019-09-26 NOTE — ED Notes (Signed)
Due to need for cardiac monitoring, pt not appropriate for 5N placement. Attending MD paged, will order appropriate admit placement

## 2019-09-26 NOTE — ED Notes (Signed)
Admitting MD returned call, SDU admit order placed for pt

## 2019-09-26 NOTE — Assessment & Plan Note (Signed)
Likely with accompanying osteomyelitis.  This is progressing rapidly and is concerning for sepsis especially given patient's tachycardia.  Patient warrants admission to the hospital with IV antibiotics and MRI.  Patient is prepared to go to the emergency department today.  Called the charge nurse to let her know to expect this patient so that he can get assessment and treatment as soon as possible.  Also called our inpatient team to inform them of his likely admission.

## 2019-09-26 NOTE — ED Notes (Signed)
Admitting physician paged for updated SDU admit order to better reflect/support pt's current vital signs

## 2019-09-26 NOTE — H&P (Addendum)
[facility] Admission History and Physical Service Pager: [phone]  Patient name: Rodney [name] Medical record number: [id number] Date of birth: December 06, 1950 Age: 69 y.o. Gender: male  Primary Care Provider: [name], MD Consultants: Orthopedic surgery Code Status: Full Preferred Emergency Contact: Kristine Linea (girlfriend) [phone]  Chief Complaint: Left foot cellulitis and ulcer  Assessment and Plan: Rodney [name] is a 69 y.o. male presenting with left foot cellulitis/ulceration. PMH is significant for hypertension, hyperlipidemia, tobacco abuse, type 2 diabetes, dyspnea, peripheral edema.  Left foot cellulitis  Sepsis  Osteomyelitis  Necrotizing Fasciitis: Patient with recent diagnosis of cellulitis last week, has been taking Bactrim as prescribed, however cellulitis has been worsening.  Patient endorses redness spreading up his leg and new area of skin sloughing off his fourth toe.  Denies fevers and chills.  Patient does have tachycardia with heart rates in the low to mid 130s which is concerning for picture of sepsis, particularly with a rapidly progressing cellulitis.  There is also concern for an accompanying osteomyelitis.  In the emergency department patient was started on Zosyn and vancomycin. Clindamycin was also started. A left foot x-ray was performed which showed a fracture of the base of the proximal phalanx of the fifth digit as well as soft tissue swelling of the forefoot findings concerning for necrotizing fasciitis.  Orthopedic surgery was consulted who came quickly to evaluate the patient and determined he would need an urgent below-knee amputation tomorrow.  -Admit to [facility], attending Dr. Andria Frames -Orthopedic consult, appreciate recommendations  -Plan for urgent below-knee amputation surgery tomorrow.  -N.p.o. at midnight  -Per orthopedics MRI scan cancelled as it is not needed at this time. -Started on Zosyn and  vancomycin in the emergency department, added Clindamycin - as there is a Scientist, research (medical), Pharmacy recommends continuing Zosyn but substituting Linezolid for the Vanc and Clindamycin. -PT/OT evaluate and treat -Follow-up blood cultures  Tachycardia with dyspnea Current heart rates in the low to mid 130s.  Likely related to patient's infection discussed above.  Patient status post 3500 mL lactated Ringer bolus in the emergency department and patient's blood pressures improved, however tachycardia remained unchanged.  Patient does have what he believes to be an increased shortness of breath as well as obvious increase in swelling up to the left lower extremity which has cellulitis present.  With this patient in the setting of infection and stasis there is concern for PE, however due to his AKI he would not be appropriate for CTA at this time. Well's Score 7.5 indicating 40.6% chance of PE. - Continuous cardiac monitoring - Arterial and venous u/s of lower extremities - to evaluate for blood flow to extremity for healing as well as DVT rule out - Echocardiogram ordered - patient with shortness of breath, increase in resp rate after receiving 3.5L of LR. - VQ Scan ordered to eval for PE - D-Dimer - Supplemental O2 - Heparin drip per Pharm  Type 2 diabetes-uncontrolled with associated neuropathy and peripheral vascular disease Most recent A1c 09/21/2019 of 8.7%.  Patient's home medications include Novolin 70/30, 75 units in the morning 55 units in the evening, Metformin 1000 mg twice daily -We will hold insulin right now as patient will be n.p.o. in a few hours with plan to start Lantus 20 units after surgery, per pharmacy -Sliding scale insulin-moderate -CBG monitoring -Hold Metformin  AKI: Creatinine 2.08 with baseline appears to be around 1-1.10.  Status post 3.5 L lactated Ringer's in the ED. -AM  BMP -We will hold lisinopril-HCTZ and Lasix in the setting of hypotension and AKI -Avoid  nephrotoxic agents  Peripheral edema Home medications include furosemide 40 mg daily. -Continue home furosemide  Hyperlipidemia Home medications include atorvastatin 40 mg daily -Continue home atorvastatin  Hypertension Home medications include lisinopril-hydrochlorothiazide 20-25 mg daily.  Blood pressures since admission have been soft with the lowest at 90/57. -We will hold lisinopril-hydrochlorothiazide and Lasix in the setting of hypotension. -Consider restarting when appropriate.  History of tobacco abuse Longstanding history of tobacco use disorder, recently quit smoking. -Recommend continued cessation  FEN/GI: Heart healthy/carb modified diet, n.p.o. at midnight for surgery tomorrow Prophylaxis: Heparin drip  Disposition: Admit to MedSurg  History of Present Illness:  Rodney [name] is a 69 y.o. male presenting with 1 week of worsening lower left limb infection.  Initially started on antibiotics due to concern for cellulitis, however this infection has been worsening with the redness spreading over the patient's lower limb.  Patient also presents with tachycardia given concern for sepsis as well as his worsening infection concerning for picture of cellulitis over an accompanying osteomyelitis.  In the emergency department patient was started on IV antibiotics and orthopedic surgery was consulted who quickly came to evaluate the patient.  Orthopedic surgery determined the patient would likely need a urgent below-knee amputation which they have scheduled for tomorrow.  Patient endorses swelling for 1 week, blackness started in his toes Thursday (5-6 days),   DENIES: fevers (T 99 in the hospital), body aches, no chest pain, stomach pains POSITIVE: Believes he has wheezing, has dyspnea (normal, d/t smoking and welding), leg swelling   Review Of Systems: Per HPI with the following additions:   Review of Systems  Constitutional: Negative for chills and fever.  Respiratory:  Positive for shortness of breath. Negative for chest tightness.   Cardiovascular: Negative for chest pain.  Gastrointestinal: Negative for abdominal pain.  Musculoskeletal: Positive for joint swelling.  Skin: Positive for wound.     Patient Active Problem List   Diagnosis Date Noted  . Cellulitis 09/22/2019  . Foot ulcer ([place]) 09/22/2019  . Dyspnea 07/26/2018  . Bilateral otitis media 06/22/2018  . Peripheral edema 02/24/2018  . Umbilical hernia 92/02/9416  . Obesity 10/09/2015  . Tobacco abuse 09/07/2013  . Uncontrolled type 2 diabetes mellitus with diabetic neuropathy, with long-term current use of insulin ([place]) 09/07/2013  . Colon cancer screening 09/07/2013  . Hypercholesteremia 10/28/2010  . ERECTILE DYSFUNCTION 05/22/2009  . HYPERTENSION 01/23/2009    Past Medical History: History reviewed. No pertinent past medical history.  Past Surgical History: History reviewed. No pertinent surgical history.  Social History: Social History   Tobacco Use  . Smoking status: Current Every Day Smoker    Packs/day: 0.50    Types: Cigarettes  . Smokeless tobacco: Never Used  Substance Use Topics  . Alcohol use: Yes    Alcohol/week: 6.0 standard drinks    Types: 6 drink(s) per week  . Drug use: No   Additional social history: No alcohol consumption in 2 months Illicit: cocaine over 1 month ago Please also refer to relevant sections of EMR.  Family History: No family history on file.  Allergies and Medications: No Known Allergies No current facility-administered medications on file prior to encounter.   Current Outpatient Medications on File Prior to Encounter  Medication Sig Dispense Refill  . aspirin 81 MG tablet Take 81 mg by mouth daily.      Marland Kitchen atorvastatin (LIPITOR) 40 MG tablet TAKE 1  TABLET (40 MG TOTAL) BY MOUTH AT BEDTIME. 90 tablet 3  . furosemide (LASIX) 40 MG tablet Take 1 tablet by mouth once daily 90 tablet 0  . glucose blood (ACCU-CHEK AVIVA) test strip  Use as instructed 300 each 3  . insulin NPH-regular Human (NOVOLIN 70/30 RELION) (70-30) 100 UNIT/ML injection 70 units every morning and 50 units every evening. 10 mL 0  . lisinopril-hydrochlorothiazide (ZESTORETIC) 20-25 MG tablet TAKE 1 TABLET EVERY DAY 90 tablet 3  . metFORMIN (GLUCOPHAGE) 1000 MG tablet TAKE 1 TABLET (1,000 MG TOTAL) BY MOUTH 2 (TWO) TIMES DAILY WITH A MEAL. 180 tablet 3  . sulfamethoxazole-trimethoprim (BACTRIM DS) 800-160 MG tablet Take 1 tablet by mouth 2 (two) times daily. 14 tablet 0    Objective: BP 106/90   Pulse (!) 134   Temp 99 F (37.2 C) (Oral)   Resp 18   Ht 6' (1.829 m)   Wt 116.6 kg   SpO2 95%   BMI 34.86 kg/m  Exam: General: Alert and oriented, no apparent distress  Eyes: PERRLA ENTM: No pharyengeal erythema Neck: nontender Cardiovascular: RRR with no murmurs noted Respiratory: CTA bilaterally, no wheezing on exam  Gastrointestinal: Bowel sounds present. No abdominal pain Derm: No rashes noted Extremities: Left lower extremity with necrotic toe, erythema, warmth, significant swelling and exquisite tenderness to palpation rising up halfway between the ankle and knee. Psych: Behavior and speech appropriate to situation          Labs and Imaging: CBC BMET  Recent Labs  Lab 09/26/19 1449  WBC 17.4*  HGB 10.1*  HCT 30.7*  PLT 351   Recent Labs  Lab 09/26/19 1449  NA 129*  K 4.6  CL 90*  CO2 26  BUN 64*  CREATININE 2.08*  GLUCOSE 194*  CALCIUM 9.4     DG Chest 2 View  Result Date: 09/26/2019 CLINICAL DATA:  Fever EXAM: CHEST - 2 VIEW COMPARISON:  Radiograph 01/18/2009 FINDINGS: Stable cardiac silhouette. Prominent epicardial fat pad. Lungs are hyperinflated. No effusion, infiltrate, or pneumothorax. IMPRESSION: Hyperinflated lungs.  No acute findings Electronically Signed   By: Suzy Bouchard M.D.   On: 09/26/2019 15:16   DG Foot 2 Views Left  Result Date: 09/26/2019 CLINICAL DATA:  69 year old male with foot infection  and necrotic toe. EXAM: LEFT FOOT - 2 VIEW COMPARISON:  Left foot radiograph dated 09/21/2019. FINDINGS: There is fracture of the base of the proximal phalanx of the fifth digit which may represent a pathologic fracture secondary to osteomyelitis. Clinical correlation is recommended. No other acute fracture identified. There is no dislocation. The bones are osteopenic. There is diffuse subcutaneous soft tissue edema and soft tissue swelling of the forefoot with large pockets of soft tissue air along the dorsum of the forefoot most concerning for necrotizing fasciitis. IMPRESSION: 1. Fracture of the base of the proximal phalanx of the fifth digit. 2. 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 Electronically Signed   By: Anner Crete M.D.   On: 09/26/2019 16:01   DG Foot Complete Left  Result Date: 09/22/2019 CLINICAL DATA:  Left lateral heel pain for the past 2-3 months. Clinical suspicion for osteomyelitis. EXAM: LEFT FOOT - COMPLETE 3+ VIEW COMPARISON:  None. FINDINGS: Soft tissue defect in the plantar soft tissues at the level of the 5th MTP joint. Diffuse soft tissue swelling, most pronounced dorsally. No soft tissue gas, bone destruction or periosteal reaction. Atheromatous arterial calcifications. Moderate posterior patellar enthesophyte formation. IMPRESSION: Soft tissue  defect in the plantar soft tissues at the level of the 5th MTP joint with no radiographic evidence of osteomyelitis. Electronically Signed   By: Claudie Revering M.D.   On: 09/22/2019 09:48    Lurline Del, DO 09/26/2019, 5:43 PM PGY-1, [facility] Intern pager: [phone], text pages welcome   FPTS Upper-Level Resident Addendum   I have independently interviewed and examined the patient. I have discussed the above with the original author and agree with their documentation. My edits for correction/addition/clarification are in blue. Please see also any attending notes.     Milus Banister, DO PGY-2, [facility] 09/26/2019 9:04 PM  [facility] Service pager: [phone] (text pages welcome through [facility])

## 2019-09-26 NOTE — Progress Notes (Signed)
° ° °  SUBJECTIVE:   CHIEF COMPLAINT / HPI:   Left foot cellulitis and ulcer follow up Patient reports that he was diagnosed with cellulitis at his visit last week, and he has been taking Bactrim as prescribed.  He and his wife say that the red area on his foot has been spreading up his leg, and he has a new area of skin sloughing on his fourth toe.  He says that this all started when the callus on the base of the left metatarsal fell off.  He and his wife deny fever or chills, but he has had a reduced appetite and slightly more difficulty breathing over the last few days.  He has had intense pain from the area and would like something for pain as soon as possible.   PERTINENT  PMH / PSH: uncontrolled T2DM, HTN, tobacco abuse, obesity  OBJECTIVE:   BP 119/70    Pulse (!) 135    Ht 6' (1.829 m)    Wt 257 lb 12.8 oz (116.9 kg)    SpO2 93%    BMI 34.96 kg/m   General: Chronically ill-appearing gentleman in no acute distress Cardiac: Tachycardic, regular rhythm, no murmur Pulmonary: CTA B, mildly increased work of breathing after walking Left foot: Erythematous left foot with extension halfway up the left leg.  Dark left toe with skin sloughing.  At the base of the fifth metatarsal, an ulcer is noted with a foul smell.        ASSESSMENT/PLAN:   Cellulitis Likely with accompanying osteomyelitis.  This is progressing rapidly and is concerning for sepsis especially given patient's tachycardia.  Patient warrants admission to the hospital with IV antibiotics and MRI.  Patient is prepared to go to the emergency department today.  Called the charge nurse to let her know to expect this patient so that he can get assessment and treatment as soon as possible.  Also called our inpatient team to inform them of his likely admission.     Kathrene Alu, MD [place]

## 2019-09-26 NOTE — Consult Note (Signed)
Reason for Consult:Left foot infection Referring Physician: D Dayle Mcnerney is an 69 y.o. male.  HPI: Virgle comes in with a 10d hx/o pain and redness of the left foot. He notes that he's had years where he's developed a painful callus on the sole that he has periodically ground with pumice. The last time it left a hole in the skin. About 10d ago he began to have redness, pain and swelling and was put on some abx but did not have any benefit that he could tell. On Friday his 4th toe began to turn black. He denies any prior hx/o similar.  History reviewed. No pertinent past medical history.  History reviewed. No pertinent surgical history.  No family history on file.  Social History:  reports that he has been smoking cigarettes. He has been smoking about 0.50 packs per day. He has never used smokeless tobacco. He reports current alcohol use of about 6.0 standard drinks of alcohol per week. He reports that he does not use drugs.  Allergies: No Known Allergies  Medications: I have reviewed the patient's current medications.  Results for orders placed or performed during the hospital encounter of 09/26/19 (from the past 48 hour(s))  CBC with Differential     Status: Abnormal   Collection Time: 09/26/19  2:49 PM  Result Value Ref Range   WBC 17.4 (H) 4.0 - 10.5 K/uL   RBC 3.40 (L) 4.22 - 5.81 MIL/uL   Hemoglobin 10.1 (L) 13.0 - 17.0 g/dL   HCT 30.7 (L) 39.0 - 52.0 %   MCV 90.3 80.0 - 100.0 fL   MCH 29.7 26.0 - 34.0 pg   MCHC 32.9 30.0 - 36.0 g/dL   RDW 12.9 11.5 - 15.5 %   Platelets 351 150 - 400 K/uL   nRBC 0.0 0.0 - 0.2 %   Neutrophils Relative % 85 %   Neutro Abs 14.8 (H) 1.7 - 7.7 K/uL   Lymphocytes Relative 8 %   Lymphs Abs 1.4 0.7 - 4.0 K/uL   Monocytes Relative 5 %   Monocytes Absolute 0.8 0.1 - 1.0 K/uL   Eosinophils Relative 1 %   Eosinophils Absolute 0.1 0.0 - 0.5 K/uL   Basophils Relative 0 %   Basophils Absolute 0.1 0.0 - 0.1 K/uL   Immature Granulocytes 1  %   Abs Immature Granulocytes 0.20 (H) 0.00 - 0.07 K/uL    Comment: Performed at [facility], [address], [place], [place] [address]    DG Chest 2 View  Result Date: 09/26/2019 CLINICAL DATA:  Fever EXAM: CHEST - 2 VIEW COMPARISON:  Radiograph 01/18/2009 FINDINGS: Stable cardiac silhouette. Prominent epicardial fat pad. Lungs are hyperinflated. No effusion, infiltrate, or pneumothorax. IMPRESSION: Hyperinflated lungs.  No acute findings Electronically Signed   By: Suzy Bouchard M.D.   On: 09/26/2019 15:16    Review of Systems  HENT: Negative for ear discharge, ear pain, hearing loss and tinnitus.   Eyes: Negative for photophobia and pain.  Respiratory: Negative for cough and shortness of breath.   Cardiovascular: Negative for chest pain.  Gastrointestinal: Negative for abdominal pain, nausea and vomiting.  Genitourinary: Negative for dysuria, flank pain, frequency and urgency.  Musculoskeletal: Positive for arthralgias (Left foot). Negative for back pain, myalgias and neck pain.  Neurological: Negative for dizziness and headaches.  Hematological: Does not bruise/bleed easily.  Psychiatric/Behavioral: The patient is not nervous/anxious.    Blood pressure 99/64, pulse (!) 134, temperature 99 F (37.2 C), temperature  source Oral, resp. rate 18, height 6' (1.829 m), weight 116.6 kg, SpO2 96 %. Physical Exam  Constitutional: He appears well-developed and well-nourished. No distress.  HENT:  Head: Normocephalic and atraumatic.  Eyes: Conjunctivae are normal. Right eye exhibits no discharge. Left eye exhibits no discharge. No scleral icterus.  Cardiovascular: Normal rate and regular rhythm.  Respiratory: Effort normal. No respiratory distress.  Musculoskeletal:     Cervical back: Normal range of motion.     Comments: LLE No traumatic wounds, ecchymosis, or rash  Foot erythematous, 3+ NP edema, mod TTP, necrotic 4th toe, plantar ulceration over 5th MTP, foul odor and  discharge  No knee effusion  Knee stable to varus/ valgus and anterior/posterior stress  Sens SPN, TN intact, DPN nearly absent  Motor EHL, ext, flex, evers 5/5  DP 0, PT 0  Neurological: He is alert.  Skin: Skin is warm and dry. He is not diaphoretic.  Psychiatric: He has a normal mood and affect. His behavior is normal.    Assessment/Plan: Left foot infection -- Will check ABI's. Dr. Sharol Given to evaluate later today or in AM. Will make NPO after MN as I anticipate a 4th toe amputation if not something more extensive. Multiple medical problems including DM, HTN, tobacco use, and obesity -- IM to admit, manage, and clear. Appreciate their help.    Lisette Abu, PA-C Orthopedic Surgery [phone] 09/26/2019, 3:29 PM

## 2019-09-27 ENCOUNTER — Inpatient Hospital Stay (HOSPITAL_COMMUNITY): Payer: Medicare PPO

## 2019-09-27 ENCOUNTER — Encounter (HOSPITAL_COMMUNITY): Payer: Medicare PPO

## 2019-09-27 DIAGNOSIS — L97529 Non-pressure chronic ulcer of other part of left foot with unspecified severity: Secondary | ICD-10-CM

## 2019-09-27 DIAGNOSIS — M79609 Pain in unspecified limb: Secondary | ICD-10-CM

## 2019-09-27 DIAGNOSIS — D638 Anemia in other chronic diseases classified elsewhere: Secondary | ICD-10-CM | POA: Diagnosis present

## 2019-09-27 DIAGNOSIS — I10 Essential (primary) hypertension: Secondary | ICD-10-CM

## 2019-09-27 DIAGNOSIS — I96 Gangrene, not elsewhere classified: Secondary | ICD-10-CM

## 2019-09-27 DIAGNOSIS — R600 Localized edema: Secondary | ICD-10-CM

## 2019-09-27 LAB — BASIC METABOLIC PANEL
Anion gap: 10 (ref 5–15)
BUN: 59 mg/dL — ABNORMAL HIGH (ref 8–23)
CO2: 25 mmol/L (ref 22–32)
Calcium: 8.5 mg/dL — ABNORMAL LOW (ref 8.9–10.3)
Chloride: 95 mmol/L — ABNORMAL LOW (ref 98–111)
Creatinine, Ser: 2.12 mg/dL — ABNORMAL HIGH (ref 0.61–1.24)
GFR calc Af Amer: 36 mL/min — ABNORMAL LOW (ref >60)
GFR calc non Af Amer: 31 mL/min — ABNORMAL LOW (ref >60)
Glucose, Bld: 143 mg/dL — ABNORMAL HIGH (ref 70–99)
Potassium: 4.9 mmol/L (ref 3.5–5.1)
Sodium: 130 mmol/L — ABNORMAL LOW (ref 135–145)

## 2019-09-27 LAB — GLUCOSE, CAPILLARY
Glucose-Capillary: 131 mg/dL — ABNORMAL HIGH (ref 70–99)
Glucose-Capillary: 125 mg/dL — ABNORMAL HIGH (ref 70–99)
Glucose-Capillary: 129 mg/dL — ABNORMAL HIGH (ref 70–99)
Glucose-Capillary: 111 mg/dL — ABNORMAL HIGH (ref 70–99)
Glucose-Capillary: 139 mg/dL — ABNORMAL HIGH (ref 70–99)
Glucose-Capillary: 223 mg/dL — ABNORMAL HIGH (ref 70–99)

## 2019-09-27 LAB — HEPARIN LEVEL (UNFRACTIONATED)
Heparin Unfractionated: 0.22 IU/mL — ABNORMAL LOW (ref 0.30–0.70)
Heparin Unfractionated: 0.13 IU/mL — ABNORMAL LOW (ref 0.30–0.70)
Heparin Unfractionated: 0.19 IU/mL — ABNORMAL LOW (ref 0.30–0.70)

## 2019-09-27 LAB — HIV ANTIBODY (ROUTINE TESTING W REFLEX): HIV Screen 4th Generation wRfx: NONREACTIVE

## 2019-09-27 LAB — CBC
HCT: 27.9 % — ABNORMAL LOW (ref 39.0–52.0)
Hemoglobin: 8.8 g/dL — ABNORMAL LOW (ref 13.0–17.0)
MCH: 29.2 pg (ref 26.0–34.0)
MCHC: 31.5 g/dL (ref 30.0–36.0)
MCV: 92.7 fL (ref 80.0–100.0)
Platelets: 348 10*3/uL (ref 150–400)
RBC: 3.01 MIL/uL — ABNORMAL LOW (ref 4.22–5.81)
RDW: 13.2 % (ref 11.5–15.5)
WBC: 17.3 10*3/uL — ABNORMAL HIGH (ref 4.0–10.5)
nRBC: 0 % (ref 0.0–0.2)

## 2019-09-27 LAB — TSH: TSH: 1.215 u[IU]/mL (ref 0.350–4.500)

## 2019-09-27 LAB — BRAIN NATRIURETIC PEPTIDE: B Natriuretic Peptide: 195.1 pg/mL — ABNORMAL HIGH (ref 0.0–100.0)

## 2019-09-27 LAB — LACTIC ACID, PLASMA: Lactic Acid, Venous: 1.1 mmol/L (ref 0.5–1.9)

## 2019-09-27 MED ORDER — HEPARIN BOLUS VIA INFUSION
2000.0000 [IU] | Freq: Once | INTRAVENOUS | Status: AC
  Administered 2019-09-28: 2000 [IU] via INTRAVENOUS
  Filled 2019-09-27: qty 2000

## 2019-09-27 MED ORDER — CEFAZOLIN SODIUM-DEXTROSE 2-4 GM/100ML-% IV SOLN: 2.0000 g | INTRAVENOUS | Status: DC

## 2019-09-27 MED ORDER — LINEZOLID 600 MG/300ML IV SOLN
600.0000 mg | Freq: Two times a day (BID) | INTRAVENOUS | Status: DC
  Administered 2019-09-27 – 2019-09-30 (×7): 600 mg via INTRAVENOUS
  Filled 2019-09-27 (×8): qty 300

## 2019-09-27 MED ORDER — HEPARIN BOLUS VIA INFUSION
2000.0000 [IU] | Freq: Once | INTRAVENOUS | Status: AC
  Administered 2019-09-27: 2000 [IU] via INTRAVENOUS
  Filled 2019-09-27: qty 2000

## 2019-09-27 MED ORDER — TECHNETIUM TO 99M ALBUMIN AGGREGATED
1.6000 | Freq: Once | INTRAVENOUS | Status: AC | PRN
  Administered 2019-09-27: 1.6 via INTRAVENOUS

## 2019-09-27 MED ORDER — TECHNETIUM TC 99M DIETHYLENETRIAME-PENTAACETIC ACID
40.0000 | Freq: Once | INTRAVENOUS | Status: AC | PRN
  Administered 2019-09-27: 40 via INTRAVENOUS

## 2019-09-27 MED ORDER — POVIDONE-IODINE 10 % EX SWAB
2.0000 "application " | Freq: Once | CUTANEOUS | Status: AC
  Administered 2019-09-28: 2 via TOPICAL

## 2019-09-27 MED ORDER — INSULIN GLARGINE 100 UNIT/ML ~~LOC~~ SOLN
20.0000 [IU] | Freq: Every day | SUBCUTANEOUS | Status: DC
  Administered 2019-09-27 – 2019-09-28 (×2): 20 [IU] via SUBCUTANEOUS
  Filled 2019-09-27 (×3): qty 0.2

## 2019-09-27 MED ORDER — LEVALBUTEROL TARTRATE 45 MCG/ACT IN AERO: 2.0000 | INHALATION_SPRAY | Freq: Four times a day (QID) | RESPIRATORY_TRACT | Status: DC | PRN

## 2019-09-27 MED ORDER — CHLORHEXIDINE GLUCONATE 4 % EX LIQD
60.0000 mL | Freq: Once | CUTANEOUS | Status: AC
  Administered 2019-09-27: 4 via TOPICAL

## 2019-09-27 MED ORDER — LEVALBUTEROL HCL 1.25 MG/0.5ML IN NEBU: 1.2500 mg | INHALATION_SOLUTION | Freq: Four times a day (QID) | RESPIRATORY_TRACT | Status: DC | PRN

## 2019-09-27 MED ORDER — LACTATED RINGERS IV BOLUS
1000.0000 mL | Freq: Once | INTRAVENOUS | Status: AC
  Administered 2019-09-27: 1000 mL via INTRAVENOUS

## 2019-09-27 MED ORDER — ALBUTEROL SULFATE (2.5 MG/3ML) 0.083% IN NEBU: 3.0000 mL | INHALATION_SOLUTION | RESPIRATORY_TRACT | Status: DC | PRN

## 2019-09-27 MED ORDER — HEPARIN (PORCINE) 25000 UT/250ML-% IV SOLN
2250.0000 [IU]/h | INTRAVENOUS | Status: DC
  Administered 2019-09-27: 2100 [IU]/h via INTRAVENOUS
  Administered 2019-09-28: 2250 [IU]/h via INTRAVENOUS
  Filled 2019-09-27 (×2): qty 250

## 2019-09-27 NOTE — Progress Notes (Signed)
Family Medicine Teaching Service Daily Progress Note Intern Pager: [phone]  Patient name: Rodney [name] Medical record number: [id number] Date of birth: [birth year] Age: 69 y.o. Gender: male  Primary Care Provider: [name], MD Consultants: Orthopedics Code Status: Full code  Pt Overview and Major Events to Date:   Assessment and Plan: Rodney [name] is a 69 y.o. male presenting with left foot cellulitis/ulceration. PMH is significant for hypertension, hyperlipidemia, tobacco abuse, type 2 diabetes, dyspnea, peripheral edema.  Left foot cellulitis Patient diagnosed with cellulitis approximately a week ago and has been taking Bactrim since that time but cellulitis has been worsening.  Was assessed in the emergency department given rapid progression was admitted and Ortho was consulted.  Plan for below the knee amputation today.  Patient was initially started on Zosyn and vancomycin in the ED but due to vancomycin shortage this was discontinued and patient was continued on Zosyn and started on linezolid.  WBC this morning stable at 17.3 from 17.4 yesterday -Orthopedics consulted, appreciate recommendations -BKA today -Continue on Zosyn , DC vanc and clinda, start Linezolid  -PT/OT eval and treat -Follow-up on blood cultures -Vitals per routine -Morning CBCs  Tachycardia with dyspnea Patient remained tachycardic overnight with pulse rate at 125 this morning.  S/p 3500 mL bolus of lactated Ringer's in the emergency department without resolution of tachycardia. D-dimer 3.25.  Concern for PE so VQ scan has been ordered.  Patient with diminished breath sounds in all lung fields.  Mild occasional expiratory wheezing. 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  Medications include Novolin 70/30 75 units in the morning and 55 units in the evening, Metformin 1000 mg twice daily -Holding insulin right now -Lantus  20 units after surgery -Moderate SSI -CBG monitoring -Hold Metformin  AKI Creatinine on admission was 2.08 with baseline around 1-1.1.  Patient received 3 L of lactated Ringer and the ED.  Up to 2.12 this morning (6/2) -Morning BMPs -Maintenance IV fluids -Avoid nephrotoxic agents -Holding lisinopril-HCTZ combo and Lasix at this time given hypotension and AKI  Peripheral edema Medications include furosemide 40 mg daily -Holding furosemide at this time given AKI  Hyperlipidemia Home medication includes atorvastatin 40 mg daily -Continue home atorvastatin  Hypertension Home medication includes lisinopril-hydrochlorothiazide 20-25 mg daily.  Patient has been hypotensive with Bps ranging from 82/64-101/70 -Holding lisinopril-hydrochlorothiazide and Lasix at this time in the setting of hypotension and AKI -Consider restarting when appropriate  History of tobacco abuse Patient reportedly recently quit smoking -Recommend continued cessation  FEN/GI: N.p.o. for surgery PPx: Heparin drip  Disposition: Pending orthopedics procedure and control of infection  Subjective:  Patient reports he did not do well overnight because he cannot sleep.  Reports he is still having leg pain and rates it a "30 5 out of 10".  Patient also reports that he feels that people are listening to him when he saying he has trouble breathing.  He would also like to have something to eat or drink but understands that he cannot because he is going to the OR.  Objective: Temp:  [98.2 F (36.8 C)-99.6 F (37.6 C)] 98.2 F (36.8 C) (06/02 0454) Pulse Rate:  [125-137] 125 (06/02 0454) Resp:  [16-44] 20 (06/02 0454) BP: (82-119)/(57-90) 101/70 (06/02 0406) SpO2:  [93 %-99 %] 99 % (06/02 0454) Weight:  [116.6 kg-119.5 kg] 119.5 kg (06/01 2356) Physical Exam: General: Lying in bed, looking frustrated Cardiovascular: Patient is tachycardic  in the 120s but regular rhythm Respiratory: Patient has decreased lung  sounds in all fields but difficult to assess due to body habitus.  Patient also has intermittent expiratory wheeze.  No increased work of breathing with oxygen saturations in the mid 90s on room air. Abdomen: Soft, nontender, positive bowel sounds Extremities: Left lower extremity is erythematous fourth toe is gangrenous with purulent drainage from the fourth toe as well as plantar surface.  Cellulitis goes all the way up the ankle  Laboratory: Recent Labs  Lab 09/26/19 1449 09/27/19 0301  WBC 17.4* 17.3*  HGB 10.1* 8.8*  HCT 30.7* 27.9*  PLT 351 348   Recent Labs  Lab 09/26/19 1449 09/27/19 0301  NA 129* 130*  K 4.6 4.9  CL 90* 95*  CO2 26 25  BUN 64* 59*  CREATININE 2.08* 2.12*  CALCIUM 9.4 8.5*  PROT 6.9  --   BILITOT 0.8  --   ALKPHOS 198*  --   ALT 71*  --   AST 53*  --   GLUCOSE 194* 143*  Lactic acid-1.1 Heparin-0.22 Glucose-143 EKG-sinus tachycardia  Imaging/Diagnostic Tests: DG Chest 2 View  Result Date: 09/26/2019 CLINICAL DATA:  Fever EXAM: CHEST - 2 VIEW COMPARISON:  Radiograph 01/18/2009 FINDINGS: Stable cardiac silhouette. Prominent epicardial fat pad. Lungs are hyperinflated. No effusion, infiltrate, or pneumothorax. IMPRESSION: Hyperinflated lungs.  No acute findings Electronically Signed   By: Suzy Bouchard M.D.   On: 09/26/2019 15:16   DG Foot 2 Views Left  Result Date: 09/26/2019 CLINICAL DATA:  69 year old male with foot infection and necrotic toe. EXAM: LEFT FOOT - 2 VIEW COMPARISON:  Left foot radiograph dated 09/21/2019. FINDINGS: There is fracture of the base of the proximal phalanx of the fifth digit which may represent a pathologic fracture secondary to osteomyelitis. Clinical correlation is recommended. No other acute fracture identified. There is no dislocation. The bones are osteopenic. There is diffuse subcutaneous soft tissue edema and soft tissue swelling of the forefoot with large pockets of soft tissue air along the dorsum of the forefoot  most concerning for necrotizing fasciitis. IMPRESSION: 1. Fracture of the base of the proximal phalanx of the fifth digit. 2. 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 Electronically Signed   By: Anner Crete M.D.   On: 09/26/2019 16:01   VAS [place] LOWER EXTREMITY VENOUS (DVT)  Result Date: 09/27/2019  Lower Venous DVTStudy Indications: Edema, and Pain. Gangrene left foot.  Limitations: Body habitus, poor ultrasound/tissue interface and significant lower extremity pain. Comparison Study: No prior study Performing Technologist: Maudry Mayhew MHA, RDMS, RVT, RDCS  Examination Guidelines: A complete evaluation includes B-mode imaging, spectral Doppler, color Doppler, and power Doppler as needed of all accessible portions of each vessel. Bilateral testing is considered an integral part of a complete examination. Limited examinations for reoccurring indications may be performed as noted. The reflux portion of the exam is performed with the patient in reverse Trendelenburg.  +---------+---------------+---------+-----------+----------+--------------+ LEFT     CompressibilityPhasicitySpontaneityPropertiesThrombus Aging +---------+---------------+---------+-----------+----------+--------------+ CFV      Full           Yes      Yes                                 +---------+---------------+---------+-----------+----------+--------------+ SFJ      Full                                                        +---------+---------------+---------+-----------+----------+--------------+  FV Prox  Full                                                        +---------+---------------+---------+-----------+----------+--------------+ FV Mid                           Yes                                 +---------+---------------+---------+-----------+----------+--------------+ FV Distal                        Yes                                  +---------+---------------+---------+-----------+----------+--------------+ POP      Full           Yes      Yes                                 +---------+---------------+---------+-----------+----------+--------------+ PTV                              Yes                                 +---------+---------------+---------+-----------+----------+--------------+ PERO                             Yes                                 +---------+---------------+---------+-----------+----------+--------------+     Summary: LEFT: - There is no evidence of deep vein thrombosis in the lower extremity. However, portions of this examination were limited- see technologist comments above.  - No cystic structure found in the popliteal fossa.  In consideration of ABI order, and patient's significant lower extremity pain prohibiting ABI from being completed, a brief evaluation of the left PTA and DPA was completed and found to be patent with monophasic flow.  *See table(s) above for measurements and observations.    Preliminary      Gifford Shave, MD 09/27/2019, 12:12 PM PGY-1, [employer] Intern pager: [phone], text pages welcome

## 2019-09-27 NOTE — Progress Notes (Signed)
[place] for Heparin Indication: R/o PE awaiting VQ scan   Assessment: Patient is a 83 yom that presented to the ED with a Left foot cellulitis with concerns for Nec Fasc. Patient was persistently tachy and requiring supplemental 02. With concern for a PE and elevated D-Dimer. Pharmacy has been asked to dose heparin at this time until able to r/o PE with VQ scan. Patient planned for a BKA tomorrow and Heparin will need to be stopped at least 2 hours prior to procedure.  Heparin level this am 0.22 units/ml  Goal of Therapy:  Heparin level 0.3-0.7 units/ml Monitor platelets by anticoagulation protocol: Yes   Plan:  - Increase heparin drip to 1900 units/hr - Heparin level in ~ 6 hours  - Monitor patient for s/s of bleeding and CBC while on heparin   Alonso Gapinski [place] 09/27/2019,6:18 AM

## 2019-09-27 NOTE — Telephone Encounter (Signed)
Wheeled patient to ED.  Rodney [name], [place]

## 2019-09-27 NOTE — Progress Notes (Signed)
Left lower extremity venous duplex completed. Refer to "CV Proc" under chart review to view preliminary results.  09/27/2019 11:02 AM Kelby Aline., MHA, RVT, RDCS, RDMS

## 2019-09-27 NOTE — Progress Notes (Signed)
ANTICOAGULATION CONSULT NOTE  Pharmacy Consult for Heparin Indication: R/o PE awaiting VQ scan >> intermediate results  No Known Allergies  Patient Measurements: Height: 6' (182.9 cm) Weight: 119.5 kg (263 lb 7.2 oz) IBW/kg (Calculated) : 77.6 Heparin Dosing Weight: 102 kg  Vital Signs: Temp: 98.2 F (36.8 C) (06/02 2057) Temp Source: Oral (06/02 2057) BP: 95/67 (06/02 2100) Pulse Rate: 120 (06/02 2200)  Labs: Recent Labs    09/26/19 1449 09/27/19 0301 09/27/19 0542 09/27/19 1329 09/27/19 2105  HGB 10.1* 8.8*  --   --   --   HCT 30.7* 27.9*  --   --   --   PLT 351 348  --   --   --   HEPARINUNFRC  --   --  0.22* 0.13* 0.19*  CREATININE 2.08* 2.12*  --   --   --     Estimated Creatinine Clearance: 45.1 mL/min (A) (by C-G formula based on SCr of 2.12 mg/dL (H)).  Assessment: Patient is a 69 yo M that presented to the ED with a left foot wound with concerns for necrotizing fasciatis. Patient also with elevated D-dimer and concerns for PE and was started on heparin 6/1 PM.  Surgery has requested that heparin be stopped 2 hours prior to L BKA (schedule for 0715 6/4).  Hep lvl this evening remains low at 0.19  Goal of Therapy:  Heparin level 0.3-0.7 units/ml Monitor platelets by anticoagulation protocol: Yes   Plan:  Re-bolus heparin 2000 units x 1 Increase drip to 2250 units/hr Recheck at [place], PharmD, BCPS, BCCCP Clinical Pharmacist [phone]  Please check AMION for all [place] numbers  09/27/2019 11:58 PM

## 2019-09-27 NOTE — Progress Notes (Deleted)
[place] for Heparin Indication: R/o PE awaiting VQ scan >> intermediate results  No Known Allergies  Patient Measurements: Height: 6' (182.9 cm) Weight: 119.5 kg (263 lb 7.2 oz) IBW/kg (Calculated) : 77.6 Heparin Dosing Weight: 102 kg  Vital Signs: Temp: 98.5 F (36.9 C) (06/02 1600) Temp Source: Oral (06/02 1600) BP: 108/76 (06/02 1600) Pulse Rate: 126 (06/02 1600)  Labs: Recent Labs    09/26/19 1449 09/27/19 0301 09/27/19 0542 09/27/19 1329  HGB 10.1* 8.8*  --   --   HCT 30.7* 27.9*  --   --   PLT 351 348  --   --   HEPARINUNFRC  --   --  0.22* 0.13*  CREATININE 2.08* 2.12*  --   --     Estimated Creatinine Clearance: 45.1 mL/min (A) (by C-G formula based on SCr of 2.12 mg/dL (H)).   Medical History: History reviewed. No pertinent past medical history.  Medications:  Scheduled:   aspirin EC  81 mg Oral Daily   atorvastatin  40 mg Oral QHS   insulin aspart  0-15 Units Subcutaneous TID WC   insulin aspart  0-5 Units Subcutaneous QHS   povidone-iodine  2 application Topical Once    Assessment: Patient is a 69 yo M that presented to the ED with a left foot wound with concerns for necrotizing fasciatis. Patient also with elevated D-dimer and concerns for PE and was started on heparin 6/1 PM.   Surgery was scheduled for 1600 on 6/2 so heparin was held around 1400. MD just called to say that the surgery was cancelled and would like heparin restarted. Heparin level was subtherapeutic on 1900 units/hr, will increase.    Goal of Therapy:  Heparin level 0.3-0.7 units/ml Monitor platelets by anticoagulation protocol: Yes   Plan:  Restart heparin at 2200 units/hr  F/u 8hr HL  F/u surgery plans Monitor daily HL, CBC/plt Monitor for signs/symptoms of bleeding    Benetta Spar, PharmD, BCPS, BCCP Clinical Pharmacist  Please check AMION for all [place] phone numbers After 10:00 PM, call [place]

## 2019-09-27 NOTE — Progress Notes (Addendum)
[place] for Heparin Indication: R/o PE awaiting VQ scan >> intermediate results  No Known Allergies  Patient Measurements: Height: 6' (182.9 cm) Weight: 119.5 kg (263 lb 7.2 oz) IBW/kg (Calculated) : 77.6 Heparin Dosing Weight: 102 kg  Vital Signs: Temp: 98.2 F (36.8 C) (06/02 0454) Temp Source: Oral (06/02 0454) BP: 101/70 (06/02 0406) Pulse Rate: 125 (06/02 0454)  Labs: Recent Labs    09/26/19 1449 09/27/19 0301 09/27/19 0542 09/27/19 1329  HGB 10.1* 8.8*  --   --   HCT 30.7* 27.9*  --   --   PLT 351 348  --   --   HEPARINUNFRC  --   --  0.22* 0.13*  CREATININE 2.08* 2.12*  --   --     Estimated Creatinine Clearance: 45.1 mL/min (A) (by C-G formula based on SCr of 2.12 mg/dL (H)).   Medical History: History reviewed. No pertinent past medical history.  Medications:  Scheduled:  . aspirin EC  81 mg Oral Daily  . atorvastatin  40 mg Oral QHS  . insulin aspart  0-15 Units Subcutaneous TID WC  . insulin aspart  0-5 Units Subcutaneous QHS  . povidone-iodine  2 application Topical Once    Assessment: Patient is a 69 yo M that presented to the ED with a left foot wound with concerns for necrotizing fasciatis. Patient also with elevated D-dimer and concerns for PE and was started on heparin 6/1 PM.  Surgery has requested that heparin be stopped 2 hours prior to L BKA (schedule for 1600 6/2).  Of note, heparin level was subtherapeutic on 1900 units/hr.  Will not adjust at this time as heparin is to be held for surgery.  Goal of Therapy:  Heparin level 0.3-0.7 units/ml Monitor platelets by anticoagulation protocol: Yes   Plan:  Called RN and instructed her to stop the heparin (~ 1440). Patient has completed VQ scan with intermediate results. Will need to follow-up post op with regards to decisions to anticoagulation.   [employer], Pharm.D., BCPS Clinical Pharmacist  **Pharmacist phone directory can be found on  [email or web address] listed under [place].  09/27/2019 2:51 PM   Addendum: Heparin infusion has been off for ~ 2.5 hours.  Surgery has now been delayed until 6/4 given VQ intermediate for PE.  Spoke with physician team, will resume heparin.  Heparin 2000 units IV bolus x 1. Heparin infusion at 2100 units/hr. Heparin level in 6 hours. Daily heparin level and CBC while on heparin.  [employer], Pharm.D., BCPS Clinical Pharmacist  09/27/2019 5:00 PM

## 2019-09-27 NOTE — Interval H&P Note (Signed)
History and Physical Interval Note:  09/27/2019 6:59 AM  Rodney [name]  has presented today for surgery, with the diagnosis of Abscess Left Foot.  The various methods of treatment have been discussed with the patient and family. After consideration of risks, benefits and other options for treatment, the patient has consented to  Procedure(s): LEFT BELOW KNEE AMPUTATION (Left) as a surgical intervention.  The patient's history has been reviewed, patient examined, no change in status, stable for surgery.  I have reviewed the patient's chart and labs.  Questions were answered to the patient's satisfaction.     Newt Minion

## 2019-09-27 NOTE — Progress Notes (Signed)
Inpatient Diabetes Program Recommendations  AACE/ADA: New Consensus Statement on Inpatient Glycemic Control   Target Ranges:  Prepandial:   less than 140 mg/dL      Peak postprandial:   less than 180 mg/dL (1-2 hours)      Critically ill patients:  140 - 180 mg/dL  Results for Rodney, [name] (MRN [id number]) as of 09/27/2019 10:25  Ref. Range 09/27/2019 03:01  Glucose Latest Ref Range: 70 - 99 mg/dL 143 (H)   Results for Rodney, [name] (MRN [id number]) as of 09/27/2019 10:25  Ref. Range 09/27/2010 21:28  Glucose-Capillary Latest Ref Range: 70 - 99 mg/dL 297 (H)   Results for Rodney, [name] (MRN [id number]) as of 09/27/2019 10:25  Ref. Range 03/25/2017 12:00 09/26/2019 21:34  Hemoglobin A1C Latest Ref Range: 4.8 - 5.6 % 13.4 9.2 (H)   Review of Glycemic Control  Diabetes history: DM2 Outpatient Diabetes medications: 70/30 75 units QAM, 70/30 50 units QPM, Metformin 1000 mg BID Current orders for Inpatient glycemic control: Novolog 0-15 units TID with meals, Novolog 0-5 units QHS  Inpatient Diabetes Program Recommendations:   HgbA1C: A1C 9.2% on 09/26/2019 indicating an average glucose of 217 mg/dl over the past 2-3 months.  NOTE: In reviewing chart, oted patient seen Dr. [facility] Res[facility]) on 09/21/19 and per office note, A1C was 8.7% and patient reported taking DM medications as noted above. Current A1C 9.2% on 09/26/2019. Noted lab glucose of 143 mg/dl today and no basal insulin has been given since patient arrived at the hospital. Will follow along while inpatient.  Thanks, Barnie Alderman, RN, MSN, CDE Diabetes Coordinator Inpatient Diabetes Program [phone] (Team Pager from 8am to 5pm)

## 2019-09-27 NOTE — CV Procedure (Signed)
Echocardiogram attempted but HR too high. Will try later when HR has normalized.

## 2019-09-28 ENCOUNTER — Inpatient Hospital Stay (HOSPITAL_COMMUNITY): Payer: Medicare PPO

## 2019-09-28 ENCOUNTER — Inpatient Hospital Stay (HOSPITAL_COMMUNITY): Payer: Medicare PPO | Admitting: Certified Registered"

## 2019-09-28 ENCOUNTER — Encounter (HOSPITAL_COMMUNITY): Payer: Self-pay | Admitting: Family Medicine

## 2019-09-28 ENCOUNTER — Encounter (HOSPITAL_COMMUNITY)
Admission: EM | Disposition: A | Discharge status: Skilled Nursing Facility | Payer: Self-pay | Source: Home / Self Care | Attending: Family Medicine

## 2019-09-28 DIAGNOSIS — I350 Nonrheumatic aortic (valve) stenosis: Secondary | ICD-10-CM

## 2019-09-28 DIAGNOSIS — M726 Necrotizing fasciitis: Secondary | ICD-10-CM

## 2019-09-28 DIAGNOSIS — I340 Nonrheumatic mitral (valve) insufficiency: Secondary | ICD-10-CM

## 2019-09-28 DIAGNOSIS — R6520 Severe sepsis without septic shock: Secondary | ICD-10-CM

## 2019-09-28 DIAGNOSIS — N179 Acute kidney failure, unspecified: Secondary | ICD-10-CM

## 2019-09-28 DIAGNOSIS — I5020 Unspecified systolic (congestive) heart failure: Secondary | ICD-10-CM | POA: Diagnosis present

## 2019-09-28 DIAGNOSIS — D638 Anemia in other chronic diseases classified elsewhere: Secondary | ICD-10-CM

## 2019-09-28 DIAGNOSIS — A419 Sepsis, unspecified organism: Principal | ICD-10-CM

## 2019-09-28 HISTORY — PX: AMPUTATION: SHX166

## 2019-09-28 LAB — BASIC METABOLIC PANEL
Anion gap: 10 (ref 5–15)
Anion gap: 9 (ref 5–15)
BUN: 59 mg/dL — ABNORMAL HIGH (ref 8–23)
BUN: 49 mg/dL — ABNORMAL HIGH (ref 8–23)
CO2: 21 mmol/L — ABNORMAL LOW (ref 22–32)
CO2: 23 mmol/L (ref 22–32)
Calcium: 7.9 mg/dL — ABNORMAL LOW (ref 8.9–10.3)
Calcium: 7.8 mg/dL — ABNORMAL LOW (ref 8.9–10.3)
Chloride: 98 mmol/L (ref 98–111)
Chloride: 96 mmol/L — ABNORMAL LOW (ref 98–111)
Creatinine, Ser: 1.79 mg/dL — ABNORMAL HIGH (ref 0.61–1.24)
Creatinine, Ser: 1.67 mg/dL — ABNORMAL HIGH (ref 0.61–1.24)
GFR calc Af Amer: 44 mL/min — ABNORMAL LOW (ref >60)
GFR calc Af Amer: 48 mL/min — ABNORMAL LOW (ref >60)
GFR calc non Af Amer: 38 mL/min — ABNORMAL LOW (ref >60)
GFR calc non Af Amer: 42 mL/min — ABNORMAL LOW (ref >60)
Glucose, Bld: 147 mg/dL — ABNORMAL HIGH (ref 70–99)
Glucose, Bld: 217 mg/dL — ABNORMAL HIGH (ref 70–99)
Potassium: 4.8 mmol/L (ref 3.5–5.1)
Potassium: 4.9 mmol/L (ref 3.5–5.1)
Sodium: 129 mmol/L — ABNORMAL LOW (ref 135–145)
Sodium: 128 mmol/L — ABNORMAL LOW (ref 135–145)

## 2019-09-28 LAB — CBC
HCT: 27.3 % — ABNORMAL LOW (ref 39.0–52.0)
HCT: 25.4 % — ABNORMAL LOW (ref 39.0–52.0)
Hemoglobin: 8.6 g/dL — ABNORMAL LOW (ref 13.0–17.0)
Hemoglobin: 8.1 g/dL — ABNORMAL LOW (ref 13.0–17.0)
MCH: 29.3 pg (ref 26.0–34.0)
MCH: 29.5 pg (ref 26.0–34.0)
MCHC: 31.5 g/dL (ref 30.0–36.0)
MCHC: 31.9 g/dL (ref 30.0–36.0)
MCV: 92.9 fL (ref 80.0–100.0)
MCV: 92.4 fL (ref 80.0–100.0)
Platelets: 381 10*3/uL (ref 150–400)
Platelets: 384 10*3/uL (ref 150–400)
RBC: 2.94 MIL/uL — ABNORMAL LOW (ref 4.22–5.81)
RBC: 2.75 MIL/uL — ABNORMAL LOW (ref 4.22–5.81)
RDW: 13.2 % (ref 11.5–15.5)
RDW: 13.3 % (ref 11.5–15.5)
WBC: 12.1 10*3/uL — ABNORMAL HIGH (ref 4.0–10.5)
WBC: 8.8 10*3/uL (ref 4.0–10.5)
nRBC: 0 % (ref 0.0–0.2)
nRBC: 0 % (ref 0.0–0.2)

## 2019-09-28 LAB — SURGICAL PCR SCREEN
MRSA, PCR: NEGATIVE
Staphylococcus aureus: NEGATIVE

## 2019-09-28 LAB — HEPARIN LEVEL (UNFRACTIONATED): Heparin Unfractionated: 0.4 IU/mL (ref 0.30–0.70)

## 2019-09-28 LAB — ECHOCARDIOGRAM COMPLETE
Height: 72 in
Weight: 4272 oz

## 2019-09-28 LAB — GLUCOSE, CAPILLARY
Glucose-Capillary: 149 mg/dL — ABNORMAL HIGH (ref 70–99)
Glucose-Capillary: 156 mg/dL — ABNORMAL HIGH (ref 70–99)
Glucose-Capillary: 167 mg/dL — ABNORMAL HIGH (ref 70–99)
Glucose-Capillary: 196 mg/dL — ABNORMAL HIGH (ref 70–99)
Glucose-Capillary: 228 mg/dL — ABNORMAL HIGH (ref 70–99)

## 2019-09-28 LAB — TYPE AND SCREEN
ABO/RH(D): A POS
Antibody Screen: NEGATIVE

## 2019-09-28 IMAGING — CT CT ANGIO CHEST
2 of 6 series · 18 of 36 positions shown · IV contrast (omnipaque)
Comparison: Chest radiograph [DATE]; lung scan [DATE]

CLINICAL DATA: Shortness of breath

EXAM:
CT ANGIOGRAPHY CHEST WITH CONTRAST
TECHNIQUE: Multidetector CT imaging of the chest was performed using the
standard protocol during bolus administration of intravenous
contrast. Multiplanar CT image reconstructions and MIPs were
obtained to evaluate the vascular anatomy.
CONTRAST:  60mL OMNIPAQUE IOHEXOL 350 MG/ML SOLN

[Series 7: pe thins · axial · 0.87mm/px · z∈[+1201,+1482]mm · 17 of 447 slices shown]
[im 23/447  lung]
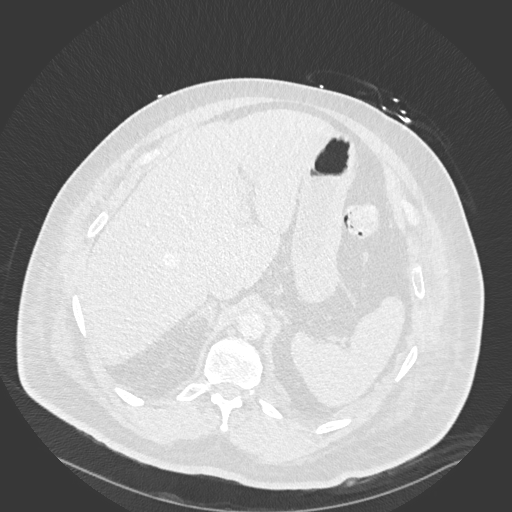
[im 45/447  mediastinal]
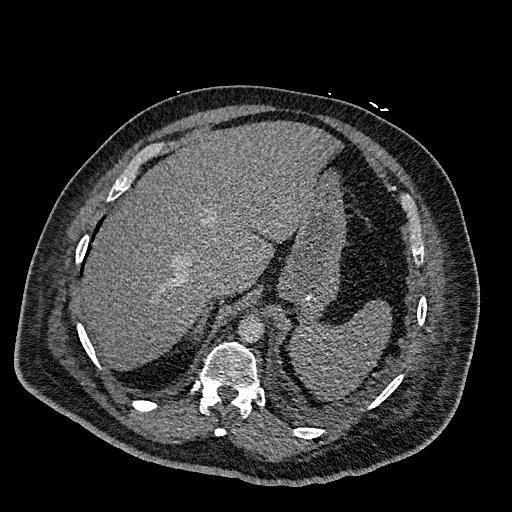
[im 67/447  lung]
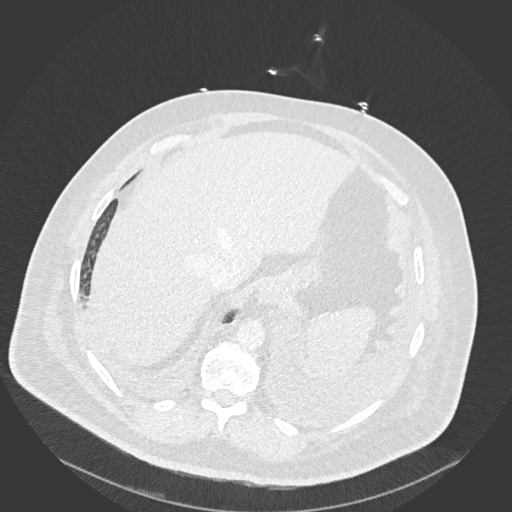
[im 90/447  mediastinal]
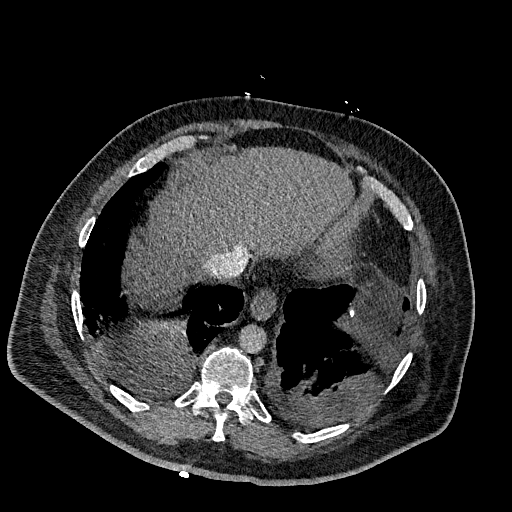
[im 134/447  lung]
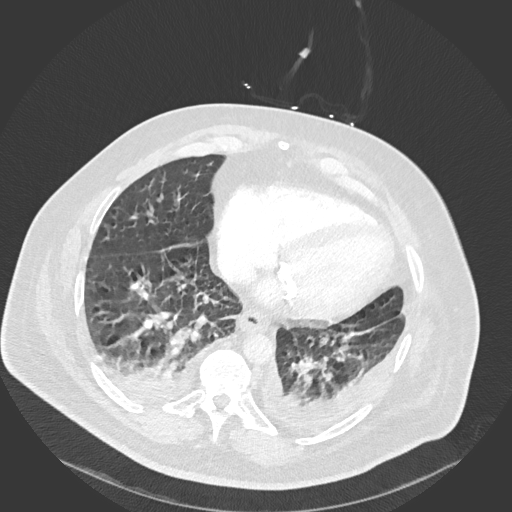
[im 157/447  mediastinal]
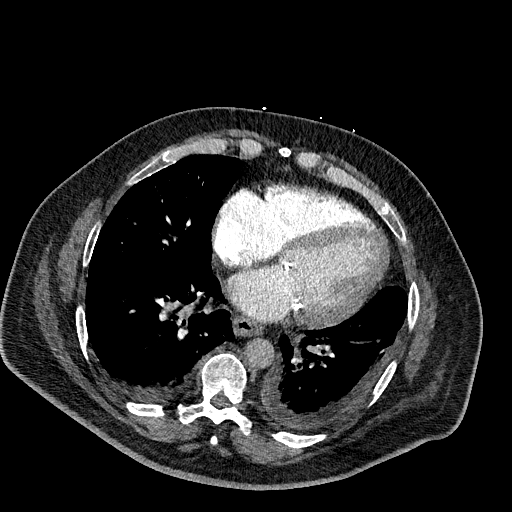
[im 179/447  lung]
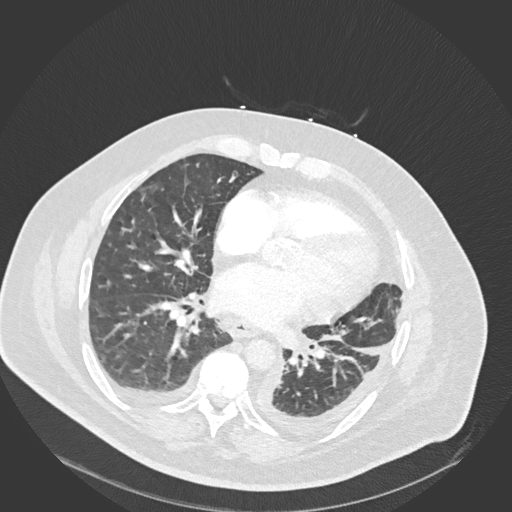
[im 201/447  mediastinal]
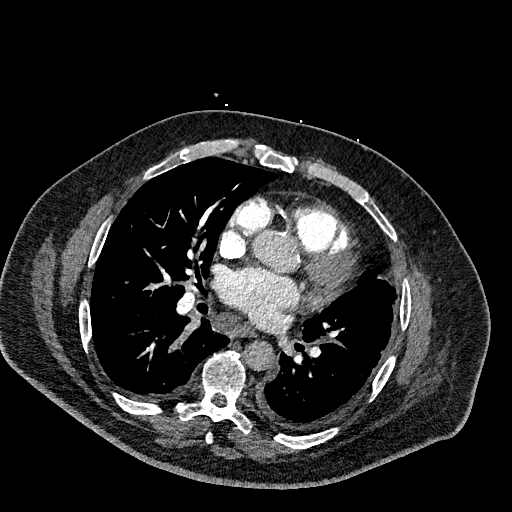
[im 224/447  lung]
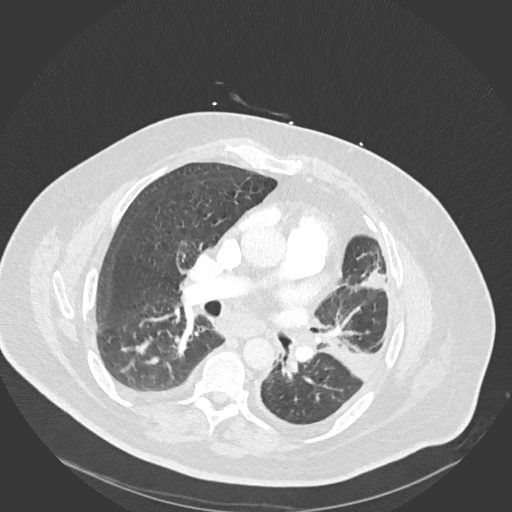
[im 246/447  mediastinal]
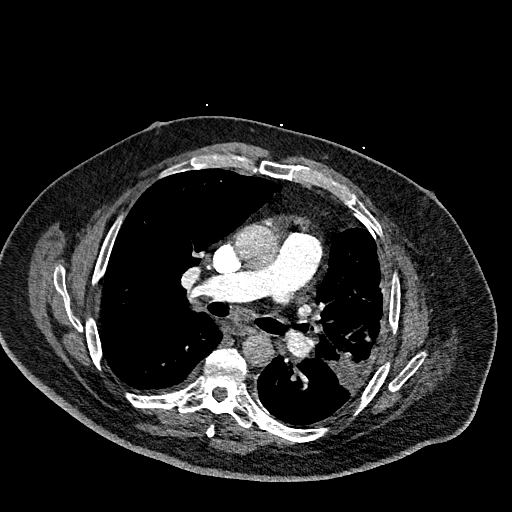
[im 268/447  lung]
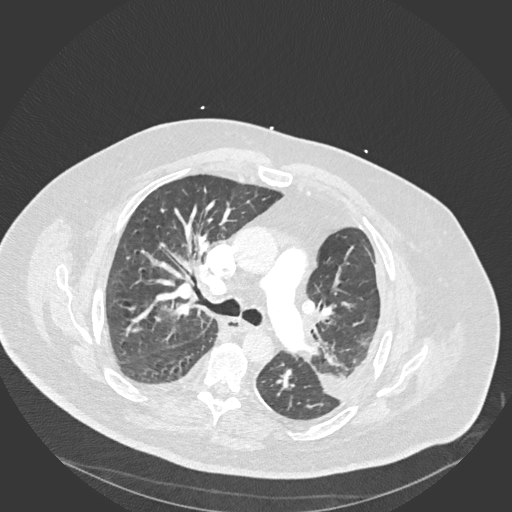
[im 290/447  mediastinal]
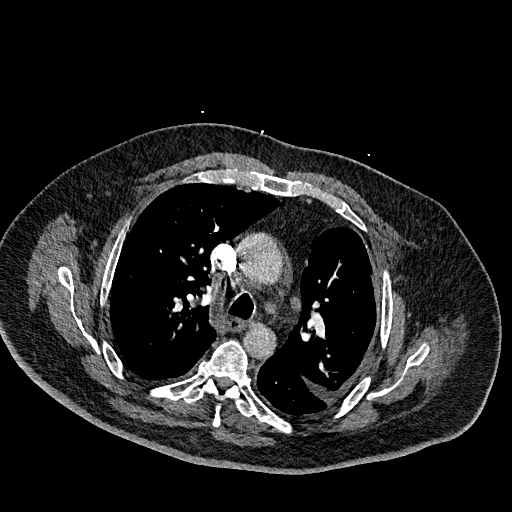
[im 313/447  lung]
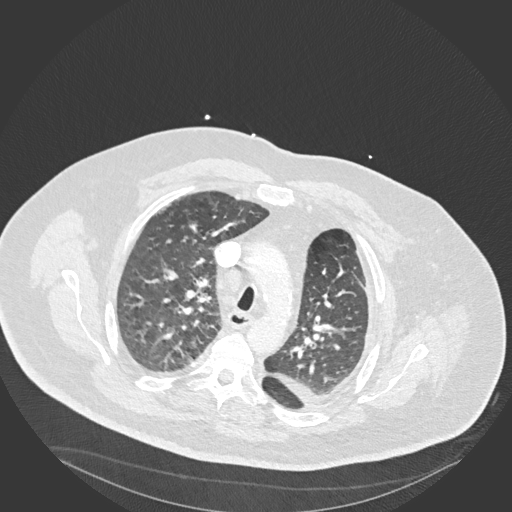
[im 357/447  mediastinal]
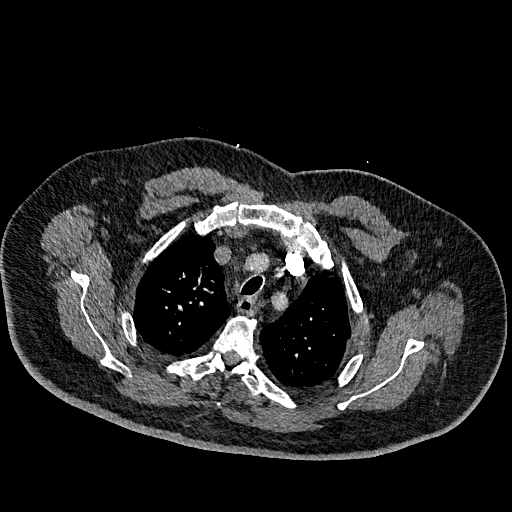
[im 380/447  lung]
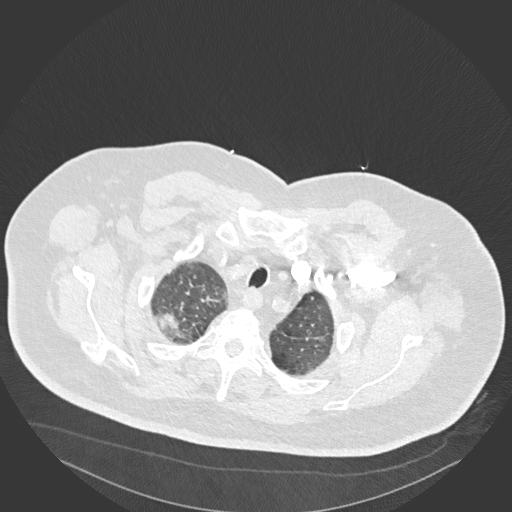
[im 402/447  mediastinal]
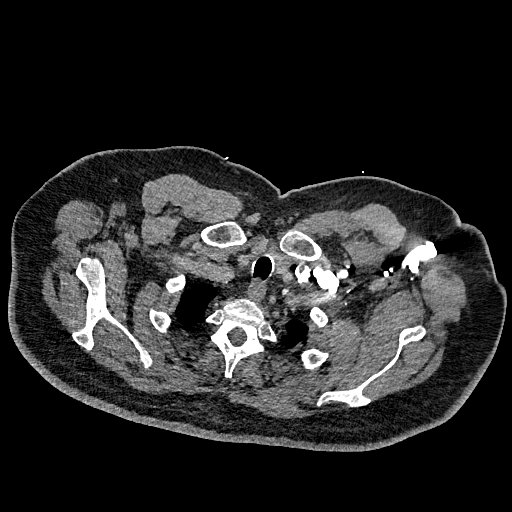
[im 424/447  lung]
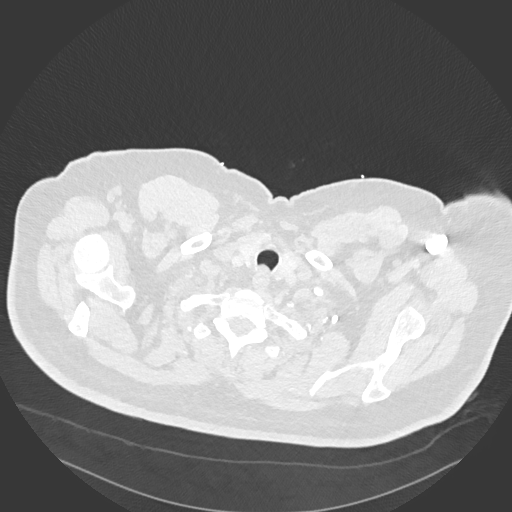

[Series 8: pe 2mm cor · coronal · 0.61mm/px · 1 of 151 slices shown]
[im 76/151  mediastinal]
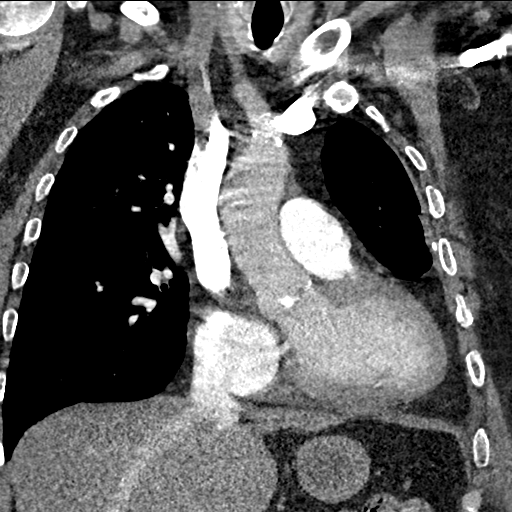

[18 of 36 positions shown; findings below may reference images not displayed]

FINDINGS: Cardiovascular: There is no demonstrable pulmonary embolus. There is
no thoracic aortic aneurysm or dissection. There are scattered foci
of calcification in the visualized great vessels. There is aortic
atherosclerosis. There are scattered foci of coronary artery
calcification. There is no pericardial effusion or pericardial
thickening. There is calcification in the mitral annulus.

Mediastinum/Nodes: Visualized thyroid appears normal. There are
scattered subcentimeter mediastinal lymph nodes. There is adenopathy
in the subcarinal region. The confluence of adenopathy in the
subcarinal region measures 3.4 x 2.3 cm. No other adenopathy by size
criteria. No esophageal lesions evident.

Lungs/Pleura: There are fairly small pleural effusions bilaterally
with patchy airspace opacity in both lung bases, slightly more on
the right than on the left. There is an area of apparent
consolidation in a portion of the inferior lingula. There is also a
focal area of airspace opacity in the posterior aspect of the apical
segment of the right upper lobe. More patchy infiltrate is noted in
the periphery of the medial segment of the right middle lobe. There
is calcification along the left hemidiaphragm.

Upper Abdomen: There is reflux of contrast into the inferior vena
cava and hepatic veins. There is upper abdominal aortic
atherosclerosis. Visualized upper abdominal structures otherwise
appear normal.

Musculoskeletal: There are no blastic or lytic bone lesions. No
chest wall lesions are evident.

Review of the MIP images confirms the above findings.
IMPRESSION: 1. No demonstrable pulmonary embolus. No thoracic aortic aneurysm or
dissection. There is aortic atherosclerosis as well as foci of
coronary artery and great vessel arterial vascular calcification.

2. Pleural effusions bilaterally, slightly larger on the right than
on the left. Multifocal areas of airspace opacity consistent with
multifocal pneumonia. Pneumonia in most notable in the lower lobes
but also present at several other sites.

3. Subcarinal adenopathy of uncertain etiology. This adenopathy may
well be secondary to the parenchymal lung changes. Neoplastic
etiology cannot be entirely excluded, however. Multiple
subcentimeter thoracic lymph nodes noted elsewhere.

4. Reflux of contrast into the inferior vena cava and hepatic veins,
a finding indicative of increased right heart pressure.

5. Calcification along the left hemidiaphragm. Question residua of
previous infection in this area. Asbestos exposure could present in
this manner, although there are no other changes elsewhere to
suggest asbestos exposure.

Aortic Atherosclerosis ([AG]-[AG]).

## 2019-09-28 SURGERY — AMPUTATION BELOW KNEE
Anesthesia: General | Site: Knee | Laterality: Left

## 2019-09-28 MED ORDER — MAGNESIUM CITRATE PO SOLN: 1.0000 | Freq: Once | ORAL | Status: DC | PRN

## 2019-09-28 MED ORDER — PROPOFOL 10 MG/ML IV BOLUS
INTRAVENOUS | Status: DC | PRN
  Administered 2019-09-28: 130 mg via INTRAVENOUS

## 2019-09-28 MED ORDER — FENTANYL CITRATE (PF) 100 MCG/2ML IJ SOLN
INTRAMUSCULAR | Status: AC
  Administered 2019-09-28: 50 ug via INTRAVENOUS
  Filled 2019-09-28: qty 2

## 2019-09-28 MED ORDER — ONDANSETRON HCL 4 MG/2ML IJ SOLN: 4.0000 mg | Freq: Four times a day (QID) | INTRAMUSCULAR | Status: DC | PRN

## 2019-09-28 MED ORDER — FENTANYL CITRATE (PF) 100 MCG/2ML IJ SOLN: 50.0000 ug | Freq: Once | INTRAMUSCULAR | Status: AC

## 2019-09-28 MED ORDER — ROPIVACAINE HCL 5 MG/ML IJ SOLN
INTRAMUSCULAR | Status: DC | PRN
  Administered 2019-09-28: 20 mL via PERINEURAL

## 2019-09-28 MED ORDER — POLYETHYLENE GLYCOL 3350 17 G PO PACK: 17.0000 g | PACK | Freq: Every day | ORAL | Status: DC | PRN

## 2019-09-28 MED ORDER — ACETAMINOPHEN 325 MG PO TABS: 325.0000 mg | ORAL_TABLET | Freq: Four times a day (QID) | ORAL | Status: DC | PRN

## 2019-09-28 MED ORDER — PHENYLEPHRINE 40 MCG/ML (10ML) SYRINGE FOR IV PUSH (FOR BLOOD PRESSURE SUPPORT)
PREFILLED_SYRINGE | INTRAVENOUS | Status: DC | PRN
  Administered 2019-09-28 (×3): 80 ug via INTRAVENOUS

## 2019-09-28 MED ORDER — SODIUM CHLORIDE 0.9 % IV SOLN: INTRAVENOUS | Status: DC

## 2019-09-28 MED ORDER — LIDOCAINE 2% (20 MG/ML) 5 ML SYRINGE
INTRAMUSCULAR | Status: DC | PRN
  Administered 2019-09-28: 100 mg via INTRAVENOUS

## 2019-09-28 MED ORDER — ONDANSETRON HCL 4 MG/2ML IJ SOLN
INTRAMUSCULAR | Status: DC | PRN
  Administered 2019-09-28: 4 mg via INTRAVENOUS

## 2019-09-28 MED ORDER — SUCCINYLCHOLINE CHLORIDE 200 MG/10ML IV SOSY
PREFILLED_SYRINGE | INTRAVENOUS | Status: AC
  Filled 2019-09-28: qty 10

## 2019-09-28 MED ORDER — METHOCARBAMOL 500 MG PO TABS: 500.0000 mg | ORAL_TABLET | Freq: Four times a day (QID) | ORAL | Status: DC | PRN

## 2019-09-28 MED ORDER — FENTANYL CITRATE (PF) 250 MCG/5ML IJ SOLN
INTRAMUSCULAR | Status: AC
  Filled 2019-09-28: qty 5

## 2019-09-28 MED ORDER — ROPIVACAINE HCL 7.5 MG/ML IJ SOLN
INTRAMUSCULAR | Status: DC | PRN
Start: 2019-09-28 — End: 2019-09-28
  Administered 2019-09-28: 20 mL via PERINEURAL

## 2019-09-28 MED ORDER — MIDAZOLAM HCL 2 MG/2ML IJ SOLN
INTRAMUSCULAR | Status: AC
  Administered 2019-09-28: 1.5 mg via INTRAVENOUS
  Filled 2019-09-28: qty 2

## 2019-09-28 MED ORDER — ONDANSETRON HCL 4 MG PO TABS: 4.0000 mg | ORAL_TABLET | Freq: Four times a day (QID) | ORAL | Status: DC | PRN

## 2019-09-28 MED ORDER — ROCURONIUM BROMIDE 10 MG/ML (PF) SYRINGE
PREFILLED_SYRINGE | INTRAVENOUS | Status: AC
  Filled 2019-09-28: qty 10

## 2019-09-28 MED ORDER — DOCUSATE SODIUM 100 MG PO CAPS
100.0000 mg | ORAL_CAPSULE | Freq: Two times a day (BID) | ORAL | Status: DC
  Administered 2019-09-28 – 2019-10-02 (×7): 100 mg via ORAL
  Filled 2019-09-28 (×9): qty 1

## 2019-09-28 MED ORDER — OXYCODONE HCL 5 MG PO TABS
10.0000 mg | ORAL_TABLET | ORAL | Status: DC | PRN
  Administered 2019-09-29: 10 mg via ORAL

## 2019-09-28 MED ORDER — PROPOFOL 10 MG/ML IV BOLUS
INTRAVENOUS | Status: AC
  Filled 2019-09-28: qty 20

## 2019-09-28 MED ORDER — METOCLOPRAMIDE HCL 5 MG PO TABS
5.0000 mg | ORAL_TABLET | Freq: Three times a day (TID) | ORAL | Status: DC | PRN
  Administered 2019-09-30: 10 mg via ORAL
  Filled 2019-09-28: qty 2

## 2019-09-28 MED ORDER — IPRATROPIUM-ALBUTEROL 0.5-2.5 (3) MG/3ML IN SOLN
RESPIRATORY_TRACT | Status: AC
  Administered 2019-09-28: 3 mL
  Filled 2019-09-28: qty 3

## 2019-09-28 MED ORDER — CLONIDINE HCL (ANALGESIA) 100 MCG/ML EP SOLN
EPIDURAL | Status: DC | PRN
  Administered 2019-09-28 (×2): 100 ug

## 2019-09-28 MED ORDER — ALBUTEROL SULFATE (2.5 MG/3ML) 0.083% IN NEBU
2.5000 mg | INHALATION_SOLUTION | RESPIRATORY_TRACT | Status: DC | PRN
  Administered 2019-09-28 – 2019-09-29 (×2): 2.5 mg via RESPIRATORY_TRACT
  Filled 2019-09-28 (×2): qty 3

## 2019-09-28 MED ORDER — HYDROMORPHONE HCL 1 MG/ML IJ SOLN: 0.5000 mg | INTRAMUSCULAR | Status: DC | PRN

## 2019-09-28 MED ORDER — SODIUM CHLORIDE 0.9 % IR SOLN
Status: DC | PRN
  Administered 2019-09-28: 1000 mL

## 2019-09-28 MED ORDER — BISACODYL 10 MG RE SUPP: 10.0000 mg | Freq: Every day | RECTAL | Status: DC | PRN

## 2019-09-28 MED ORDER — LIDOCAINE 2% (20 MG/ML) 5 ML SYRINGE
INTRAMUSCULAR | Status: AC
  Filled 2019-09-28: qty 5

## 2019-09-28 MED ORDER — FUROSEMIDE 10 MG/ML IJ SOLN
40.0000 mg | Freq: Every day | INTRAMUSCULAR | Status: DC
  Administered 2019-09-28: 40 mg via INTRAVENOUS
  Filled 2019-09-28: qty 4

## 2019-09-28 MED ORDER — IOHEXOL 350 MG/ML SOLN
60.0000 mL | Freq: Once | INTRAVENOUS | Status: AC | PRN
  Administered 2019-09-28: 60 mL via INTRAVENOUS

## 2019-09-28 MED ORDER — METOCLOPRAMIDE HCL 5 MG/ML IJ SOLN: 5.0000 mg | Freq: Three times a day (TID) | INTRAMUSCULAR | Status: DC | PRN

## 2019-09-28 MED ORDER — FENTANYL CITRATE (PF) 250 MCG/5ML IJ SOLN
INTRAMUSCULAR | Status: DC | PRN
  Administered 2019-09-28: 50 ug via INTRAVENOUS

## 2019-09-28 MED ORDER — LACTATED RINGERS IV SOLN
INTRAVENOUS | Status: DC | PRN
Start: 2019-09-28 — End: 2019-09-28

## 2019-09-28 MED ORDER — OXYCODONE HCL 5 MG PO TABS
5.0000 mg | ORAL_TABLET | ORAL | Status: DC | PRN
  Administered 2019-09-28 – 2019-10-01 (×3): 10 mg via ORAL
  Filled 2019-09-28 (×4): qty 2

## 2019-09-28 MED ORDER — MIDAZOLAM HCL 2 MG/2ML IJ SOLN: 1.5000 mg | Freq: Once | INTRAMUSCULAR | Status: AC

## 2019-09-28 MED ORDER — MIDAZOLAM HCL 2 MG/2ML IJ SOLN
INTRAMUSCULAR | Status: AC
  Filled 2019-09-28: qty 2

## 2019-09-28 MED ORDER — METHOCARBAMOL 1000 MG/10ML IJ SOLN
500.0000 mg | Freq: Four times a day (QID) | INTRAVENOUS | Status: DC | PRN
  Filled 2019-09-28: qty 5

## 2019-09-28 SURGICAL SUPPLY — 39 items
BLADE SAW RECIP 87.9 MT (BLADE) ×3 IMPLANT
BLADE SURG 21 STRL SS (BLADE) ×3 IMPLANT
BNDG COHESIVE 6X5 TAN STRL LF (GAUZE/BANDAGES/DRESSINGS) ×2 IMPLANT
CANISTER WOUND CARE 500ML ATS (WOUND CARE) ×3 IMPLANT
COVER SURGICAL LIGHT HANDLE (MISCELLANEOUS) ×5 IMPLANT
COVER WAND RF STERILE (DRAPES) IMPLANT
CUFF TOURN SGL QUICK 34 (TOURNIQUET CUFF) ×3
CUFF TRNQT CYL 34X4.125X (TOURNIQUET CUFF) ×1 IMPLANT
DRAPE INCISE IOBAN 66X45 STRL (DRAPES) ×5 IMPLANT
DRAPE U-SHAPE 47X51 STRL (DRAPES) ×3 IMPLANT
DRESSING PREVENA PLUS CUSTOM (GAUZE/BANDAGES/DRESSINGS) ×1 IMPLANT
DRSG PREVENA PLUS CUSTOM (GAUZE/BANDAGES/DRESSINGS) ×3
DURAPREP 26ML APPLICATOR (WOUND CARE) ×3 IMPLANT
ELECT REM PT RETURN 9FT ADLT (ELECTROSURGICAL) ×3
ELECTRODE REM PT RTRN 9FT ADLT (ELECTROSURGICAL) ×1 IMPLANT
GLOVE BIOGEL PI IND STRL 9 (GLOVE) ×1 IMPLANT
GLOVE BIOGEL PI INDICATOR 9 (GLOVE) ×2
GLOVE SURG ORTHO 9.0 STRL STRW (GLOVE) ×3 IMPLANT
GOWN STRL REUS W/ TWL XL LVL3 (GOWN DISPOSABLE) ×2 IMPLANT
GOWN STRL REUS W/TWL XL LVL3 (GOWN DISPOSABLE) ×6
KIT BASIN OR (CUSTOM PROCEDURE TRAY) ×3 IMPLANT
KIT TURNOVER KIT B (KITS) ×3 IMPLANT
MANIFOLD NEPTUNE II (INSTRUMENTS) ×3 IMPLANT
NS IRRIG 1000ML POUR BTL (IV SOLUTION) ×3 IMPLANT
PACK ORTHO EXTREMITY (CUSTOM PROCEDURE TRAY) ×3 IMPLANT
PAD ARMBOARD 7.5X6 YLW CONV (MISCELLANEOUS) ×3 IMPLANT
PREVENA RESTOR ARTHOFORM 46X30 (CANNISTER) ×3 IMPLANT
PREVENA RESTOR AXIOFORM 29X28 (GAUZE/BANDAGES/DRESSINGS) ×2 IMPLANT
SPONGE LAP 18X18 RF (DISPOSABLE) IMPLANT
STAPLER VISISTAT 35W (STAPLE) IMPLANT
STOCKINETTE IMPERVIOUS LG (DRAPES) ×3 IMPLANT
SUT ETHILON 2 0 PSLX (SUTURE) IMPLANT
SUT SILK 2 0 (SUTURE) ×3
SUT SILK 2-0 18XBRD TIE 12 (SUTURE) ×1 IMPLANT
SUT VIC AB 1 CTX 27 (SUTURE) ×6 IMPLANT
TOWEL GREEN STERILE (TOWEL DISPOSABLE) ×3 IMPLANT
TUBE CONNECTING 12'X1/4 (SUCTIONS) ×1
TUBE CONNECTING 12X1/4 (SUCTIONS) ×2 IMPLANT
YANKAUER SUCT BULB TIP NO VENT (SUCTIONS) ×3 IMPLANT

## 2019-09-28 NOTE — Progress Notes (Signed)
Pt had restless, shortness of breath, diaphoresis, confused at time and situations. He pulled heart monitor off. Tachypnea at rest, auscultated wheezing and coarse crackles bilaterally. SPO2 94% on 3LPM,RR 34, BP 110/82-124/93 mmHg, HR 120s - 150s, wiith frequent PAC. RRT, Mindy made aware. MEWs score red and yellow since admission.   Carrie,RT notified. RT evaluated at bedside, breathing treatment with bronchodilator given.  Symptoms relieved after treatment given. Continued with 3 LPM of O2 NCL. RR 24, SPO2 96%, HR 120s,  no wheezing auscultated.  Denied pain after Oxycodone given. We gave him Lasix 40 mg IV at bedtime per schedule. He had good amount of urine out put after Lasix.  Left BKA with wound vac had no active bleeding. We will continue to monitor.  Kennyth Lose, RN

## 2019-09-28 NOTE — Anesthesia Preprocedure Evaluation (Addendum)
Anesthesia Evaluation  Patient identified by MRN, date of birth, ID band Patient awake    Reviewed: Allergy & Precautions, NPO status , Patient's Chart, lab work & pertinent test results  Airway Mallampati: II  TM Distance: >3 FB Neck ROM: Full    Dental no notable dental hx. (+) Teeth Intact, Dental Advisory Given   Pulmonary shortness of breath, Current Smoker,    Pulmonary exam normal breath sounds clear to auscultation       Cardiovascular hypertension, Pt. on medications + Peripheral Vascular Disease  negative cardio ROS Normal cardiovascular exam Rhythm:Regular Rate:Normal     Neuro/Psych negative neurological ROS  negative psych ROS   GI/Hepatic negative GI ROS, Neg liver ROS,   Endo/Other  diabetes  Renal/GU negative Renal ROSCr 1.79 K+ 4.8     Musculoskeletal negative musculoskeletal ROS (+)   Abdominal   Peds  Hematology  (+) anemia , Hgb 8.6   Anesthesia Other Findings   Reproductive/Obstetrics                            Anesthesia Physical Anesthesia Plan  ASA: IV  Anesthesia Plan: General   Post-op Pain Management:  Regional for Post-op pain   Induction: Intravenous  PONV Risk Score and Plan: Treatment may vary due to age or medical condition, Ondansetron and Dexamethasone  Airway Management Planned: LMA  Additional Equipment: None  Intra-op Plan:   Post-operative Plan:   Informed Consent: I have reviewed the patients History and Physical, chart, labs and discussed the procedure including the risks, benefits and alternatives for the proposed anesthesia with the patient or authorized representative who has indicated his/her understanding and acceptance.     Dental advisory given  Plan Discussed with:   Anesthesia Plan Comments: (GA plus Popliteal and Adductor canal bloc )        Anesthesia Quick Evaluation

## 2019-09-28 NOTE — Progress Notes (Addendum)
Called by pt's primary RN to assess pt for increased RR, increased WOB,and  wheezing. Pt with audible expiratory wheeze throughout, including upper airway. Neb tx given, but pt could not tolerate neb mask, and when given mouthpiece, just held the treatment to the side, would not place in his mouth. Pt asking to sit up, states he can't do neb tx. RN notifying Rapid Response. RT will continue to monitor.

## 2019-09-28 NOTE — Anesthesia Postprocedure Evaluation (Signed)
Anesthesia Post Note  Patient: Rodney [name]  Procedure(s) Performed: AMPUTATION BELOW KNEE (Left Knee)     Patient location during evaluation: PACU Anesthesia Type: General Level of consciousness: awake and alert Pain management: pain level controlled Vital Signs Assessment: post-procedure vital signs reviewed and stable Respiratory status: spontaneous breathing, nonlabored ventilation, respiratory function stable and patient connected to nasal cannula oxygen Cardiovascular status: blood pressure returned to baseline and stable Postop Assessment: no apparent nausea or vomiting Anesthetic complications: no    Last Vitals:  Vitals:   09/28/19 1415 09/28/19 1430  BP: 126/76 114/76  Pulse: (!) 119 (!) 120  Resp: (!) 24 (!) 25  Temp: 36.8 C   SpO2: 97% 98%    Last Pain:  Vitals:   09/28/19 1400  TempSrc: Oral  PainSc:                  Barnet Glasgow

## 2019-09-28 NOTE — Significant Event (Signed)
Rapid Response Event Note  Overview: Called at 2231 for tachypnea-39 and tachycardia-120-140s. Per RN, RT coming to evaluate pt and RRT not needed at this time. RN asked to call back if she would like RRT to evaluate pt. At 2248, RT asked RRT to come see pt.   Initial Focused Assessment: Pt sitting on side of bed in no visible distress. Pt is alert and oriented and says he feels a lot better than he did earlier. Lungs with wheezes t/o. Skin warm and dry. T-98.4, HR-120, BP-115/75, RR-23, SpO2-94% on 3L [place].   Interventions: No RRT interventions at this time Pt given albuterol tx PTA RRT-per RT note, pt didn't tolerate tx.  Plan of Care (if not transferred): Continue to monitor pt. Albuterol tx can be given q2h prn for wheezing/SOB. Continue to reinforce importance of breathing treatments. Call RRT if further assistance needed.  Event Summary:  Called: 2231 by RN and 2248 by RT. Arrived: 2254 Ended: 2305  Dillard Essex

## 2019-09-28 NOTE — Op Note (Signed)
   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  POSTOPERATIVE DIAGNOSIS: Same.  PROCEDURE: Transtibial amputation Application of Prevena wound VAC  SURGEON: Sharol Given, M.D.  ANESTHESIA:  general  IV FLUIDS AND URINE: See anesthesia.  ESTIMATED BLOOD LOSS: Minimal mL.  COMPLICATIONS: None.  DESCRIPTION OF PROCEDURE: The patient was brought to the operating room and underwent a general anesthetic. After adequate levels of anesthesia were obtained patient's lower extremity was prepped using DuraPrep draped into a sterile field. A timeout was called. The foot was draped out of the sterile field with impervious stockinette. A transverse incision was made 11 cm distal to the tibial tubercle. This curved proximally and a large posterior flap was created. The tibia was transected 1 cm proximal to the skin incision. The fibula was transected just proximal to the tibial incision. The tibia was beveled anteriorly. A large posterior flap was created. The sciatic nerve was pulled cut and allowed to retract. The vascular bundles were suture ligated with 2-0 silk. The deep and superficial fascial layers were closed using #1 Vicryl. The skin was closed using staples and 2-0 nylon. The wound was covered with a Prevena wound VAC. There was a good suction fit. A prosthetic shrinker was applied. Patient was extubated taken to the PACU in stable condition.   DISCHARGE PLANNING:  Antibiotic duration: Continue antibiotics for at least 3 days to clear the sepsis.  Weightbearing: Nonweightbearing on the left  Pain medication: Opioid pathway ordered  Dressing care/ Wound VAC: Continue wound VAC for 1 week at discharge  Discharge to: Anticipate patient will need discharge to  skilled nursing facility.  [facility] for prosthetics.  Follow-up: In the office 1 week post operative.  Meridee Score, MD [place] 1:01 PM

## 2019-09-28 NOTE — Anesthesia Procedure Notes (Signed)
Anesthesia Regional Block: Adductor canal block   Pre-Anesthetic Checklist: ,, timeout performed, Correct Patient, Correct Site, Correct Laterality, Correct Procedure, Correct Position, site marked, Risks and benefits discussed,  Surgical consent,  Pre-op evaluation,  At surgeon's request and post-op pain management  Laterality: Lower and Left  Prep: chloraprep       Needles:  Injection technique: Single-shot  Needle Type: Echogenic Needle     Needle Length: 9cm  Needle Gauge: 22     Additional Needles:   Procedures:,,,, ultrasound used (permanent image in chart),,,,  Narrative:  Start time: 09/28/2019 11:39 AM End time: 09/28/2019 11:45 AM Injection made incrementally with aspirations every 5 mL.  Performed by: Personally  Anesthesiologist: Barnet Glasgow, MD  Additional Notes: Block assessed prior to surgery. Pt tolerated procedure well.

## 2019-09-28 NOTE — Progress Notes (Signed)
Patient ID: Rodney [name], male   DOB: [birth year], 69 y.o.   MRN: [id number] I am quite concerned that the patient's surgery was canceled yesterday for a VQ scan when patient is septic with a life-threatening necrotizing fasciitis I will try to get the patient on for surgery today n.p.o. I discussed this with the patient.

## 2019-09-28 NOTE — Progress Notes (Signed)
Pt received from PACU to 4E18. L BKA with wound vac in place. VSS. Call bell in reach. Will continue to monitor.  Arletta Bale, RN

## 2019-09-28 NOTE — Progress Notes (Signed)
Plan of care reviewed. Pt's hemodynamically stable. His MEWS score was yellow. MD aware that his HR 120s, sinus tachycardia on monitor, remained afebrile, BP 99/69- 125/82 mmHg, RR 20, SPO2   89-97%, O2 NCL 2 LPM given at night. Denied shortness of breath, Auscultated lungs clear and diminished bilaterally, no crackles, no rhonchi. Left foot Necrotizing fasciitis was very painful, he's able to tolerate with Dilaudid and Tylenol given. Pt stated his last BM was 6 days ago( 09/23/19). Will hand off to day shift team for medical management. Continue to monitor.  Kennyth Lose, RN

## 2019-09-28 NOTE — Progress Notes (Signed)
Echocardiogram 2D Echocardiogram has been performed.  [name] 09/28/2019, 3:46 PM

## 2019-09-28 NOTE — Progress Notes (Signed)
Family Medicine Teaching Service Daily Progress Note Intern Pager: [phone]  Patient name: Rodney [name] Medical record number: [id number] Date of birth: 1950/09/11 Age: 69 y.o. Gender: male  Primary Care Provider: [name], MD Consultants: Orthopedics Code Status: Full code  Pt Overview and Major Events to Date:   Assessment and Plan: DUSTIN BUMBAUGH is a 69 y.o. male presenting with left foot cellulitis/ulceration. PMH is significant for hypertension, hyperlipidemia, tobacco abuse, type 2 diabetes, dyspnea, peripheral edema.  Left foot cellulitis Patient diagnosed with cellulitis approximately a week ago and has been taking Bactrim since that time but cellulitis has been worsening.  Was assessed in the emergency department given rapid progression was admitted and Ortho was consulted.  Plan for below the knee amputation today.  Patient was initially started on Zosyn and vancomycin in the ED but due to vancomycin shortage this was discontinued and patient was continued on Zosyn and started on linezolid.  WBC this morning stable at improved at 12.1 from 17.3 yesterday. -Orthopedics consulted, appreciate recommendations -Plan for OR today for BKA -Continue on Zosyn and linezolid -PT/OT eval and treat -Follow-up on blood cultures -Vitals per routine -Morning CBCs  Tachycardia with dyspnea Patient remained tachycardic overnight with pulse rate at 124 this morning.  Patient has had multiple fluid boluses as well as approximately 4 L in fluid.  Patient still reports increased work of breathing.  Diminished breath sounds bilaterally but no crackles heard.  VQ scan yesterday showed intermediate risk for PE.  CT performed today negative for PE. 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  Medications include Novolin 70/30 75 units in the  morning and 55 units in the evening, Metformin 1000 mg twice daily -Holding insulin right now -Lantus 20 units after surgery -Moderate SSI -CBG monitoring -Hold Metformin  AKI Creatinine on admission was 2.08 with baseline around 1-1.1.  Patient received 3 L of lactated Ringer and the ED. down to 1.7 (6/3) from 2.12 yesterday -Morning BMPs -Maintenance IV fluids -Avoid nephrotoxic agents -Holding lisinopril-HCTZ combo and Lasix at this time given hypotension and AKI  Peripheral edema Medications include furosemide 40 mg daily -Holding furosemide at this time given AKI  Hyperlipidemia Home medication includes atorvastatin 40 mg daily -Continue home atorvastatin  Hypertension Home medication includes lisinopril-hydrochlorothiazide 20-25 mg daily.  Patient has been hypotensive with Bps ranging from 82/64-101/70 -Holding lisinopril-hydrochlorothiazide and Lasix at this time in the setting of hypotension and AKI -Consider restarting when appropriate  History of tobacco abuse Patient reportedly recently quit smoking -Recommend continued cessation  FEN/GI: N.p.o. for surgery PPx: Heparin drip  Disposition: Pending orthopedics procedure and control of infection  Subjective:  Patient is frustrated this morning because he said like to eat.  He reports that he was told he will be going to the OR today for his amputation.  Says that his breathing is essentially unchanged from yesterday at that his pain is still uncontrolled but that he has not asked for pain medications since last night.  I informed him that he has as needed pain meds ordered and that all he is to do is ask for them.  Objective: Temp:  [98.2 F (36.8 C)-98.5 F (36.9 C)] 98.3 F (36.8 C) (06/03 0427) Pulse Rate:  [120-126] 122 (06/03 0427) Resp:  [20-22] 20 (06/03 0427) BP: (95-125)/(67-82) 125/82 (06/03 0427) SpO2:  [90 %-98 %] 97 % (  06/03 0427) Physical Exam: General: Lying in bed, frustrated because he would  like to eat Cardiovascular: Patient is tachycardic in the 120s but regular rhythm Respiratory: Decreased breath sounds in all fields.  No crackles noted.  Mild increase in work of breathing but unchanged from yesterday's evaluation Abdomen: Soft, nontender, positive bowel sounds Extremities: Left lower extremity is erythematous with fourth toe gangrene some purulent drainage.  Erythema moving up left leg but seems mildly improved from yesterday's evaluation.  Laboratory: Recent Labs  Lab 09/26/19 1449 09/27/19 0301 09/28/19 0251  WBC 17.4* 17.3* 12.1*  HGB 10.1* 8.8* 8.6*  HCT 30.7* 27.9* 27.3*  PLT 351 348 381   Recent Labs  Lab 09/26/19 1449 09/27/19 0301  NA 129* 130*  K 4.6 4.9  CL 90* 95*  CO2 26 25  BUN 64* 59*  CREATININE 2.08* 2.12*  CALCIUM 9.4 8.5*  PROT 6.9  --   BILITOT 0.8  --   ALKPHOS 198*  --   ALT 71*  --   AST 53*  --   GLUCOSE 194* 143*  Lactic acid-1.1 Heparin-0.22 Glucose-143 EKG-sinus tachycardia  Imaging/Diagnostic Tests: NM Pulmonary Perf and Vent  Addendum Date: 09/27/2019   ADDENDUM REPORT: 09/27/2019 15:05 ADDENDUM: Findings conveyed toCresenzoon 09/27/2019 at15:03. In discussion with MD, plan to proceed with CTPA with renal precautions and reduced IV contrast dose. Electronically Signed   By: Suzy Bouchard M.D.   On: 09/27/2019 15:05   Result Date: 09/27/2019 CLINICAL DATA:  Concern for pulmonary embolism.  Tachycardia. EXAM: NUCLEAR MEDICINE VENTILATION - PERFUSION LUNG SCAN TECHNIQUE: Ventilation images were obtained in multiple projections using inhaled aerosol Tc-35m DTPA. Perfusion images were obtained in multiple projections after intravenous injection of Tc-63m MAA. RADIOPHARMACEUTICALS:  Forty mCi of Tc-25m DTPA aerosol inhalation and 1.6 mCi Tc63m MAA IV COMPARISON:  Chest radiograph 1 day prior FINDINGS: Ventilation: Marked decreased ventilation to the LEFT lung with most significant decrease within the LEFT lower lobe. Perfusion: No  wedge-shaped peripheral perfusion defect. Matched decreased perfusion to the LEFT lower lobe matches the ventilation defect. IMPRESSION: Large regional decreased ventilation and perfusion to the LEFT lower lobe. Findings could indicate in mucous plugging and atelectasis. There is some increased density in the lingula on comparison radiograph but no clear atelectasis. Central obstructing lesion is possible however again no atelectasis identified on radiograph. Consider CT of the chest further evaluation. Large LEFT lobe pulmonary embolism is not completely excluded (intermediate probability). These results will be called to the ordering clinician or representative by the Radiologist Assistant, and communication documented in the PACS or [employer]. Electronically Signed: By: Suzy Bouchard M.D. On: 09/27/2019 14:21   VAS [place] LOWER EXTREMITY VENOUS (DVT)  Result Date: 09/27/2019  Lower Venous DVTStudy Indications: Edema, and Pain. Gangrene left foot.  Limitations: Body habitus, poor ultrasound/tissue interface and significant lower extremity pain. Comparison Study: No prior study Performing Technologist: Maudry Mayhew MHA, RDMS, RVT, RDCS  Examination Guidelines: A complete evaluation includes B-mode imaging, spectral Doppler, color Doppler, and power Doppler as needed of all accessible portions of each vessel. Bilateral testing is considered an integral part of a complete examination. Limited examinations for reoccurring indications may be performed as noted. The reflux portion of the exam is performed with the patient in reverse Trendelenburg.  +---------+---------------+---------+-----------+----------+--------------+ LEFT     CompressibilityPhasicitySpontaneityPropertiesThrombus Aging +---------+---------------+---------+-----------+----------+--------------+ CFV      Full           Yes      Yes                                  +---------+---------------+---------+-----------+----------+--------------+  SFJ      Full                                                        +---------+---------------+---------+-----------+----------+--------------+ FV Prox  Full                                                        +---------+---------------+---------+-----------+----------+--------------+ FV Mid                           Yes                                 +---------+---------------+---------+-----------+----------+--------------+ FV Distal                        Yes                                 +---------+---------------+---------+-----------+----------+--------------+ POP      Full           Yes      Yes                                 +---------+---------------+---------+-----------+----------+--------------+ PTV                              Yes                                 +---------+---------------+---------+-----------+----------+--------------+ PERO                             Yes                                 +---------+---------------+---------+-----------+----------+--------------+     Summary: LEFT: - There is no evidence of deep vein thrombosis in the lower extremity. However, portions of this examination were limited- see technologist comments above.  - No cystic structure found in the popliteal fossa.  In consideration of ABI order, and patient's significant lower extremity pain prohibiting ABI from being completed, a brief evaluation of the left PTA and DPA was completed and found to be patent with monophasic flow.  *See table(s) above for measurements and observations. Electronically signed by Curt Jews MD on 09/27/2019 at 5:21:41 PM.    Final      Gifford Shave, MD 09/28/2019, 5:48 AM PGY-1, [place] Intern pager: [phone], text pages welcome

## 2019-09-28 NOTE — Transfer of Care (Signed)
Immediate Anesthesia Transfer of Care Note  Patient: Rodney [name]  Procedure(s) Performed: AMPUTATION BELOW KNEE (Left Knee)  Patient Location: PACU  Anesthesia Type:General  Level of Consciousness: sedated and responds to stimulation  Airway & Oxygen Therapy: Patient Spontanous Breathing and Patient connected to face mask oxygen  Post-op Assessment: Report given to RN, Post -op Vital signs reviewed and stable and Patient moving all extremities  Post vital signs: Reviewed and stable  Last Vitals:  Vitals Value Taken Time  BP    Temp    Pulse    Resp    SpO2      Last Pain:  Vitals:   09/28/19 1127  TempSrc:   PainSc: 0-No pain         Complications: No apparent anesthesia complications

## 2019-09-28 NOTE — Anesthesia Procedure Notes (Signed)
Procedure Name: LMA Insertion Date/Time: 09/28/2019 12:09 PM Performed by: Myna Bright, CRNA Pre-anesthesia Checklist: Patient identified, Emergency Drugs available, Suction available and Patient being monitored Patient Re-evaluated:Patient Re-evaluated prior to induction Oxygen Delivery Method: Circle system utilized Preoxygenation: Pre-oxygenation with 100% oxygen Induction Type: IV induction LMA: LMA inserted LMA Size: 5.0 Tube type: Oral Number of attempts: 1 Placement Confirmation: positive ETCO2 and breath sounds checked- equal and bilateral Tube secured with: Tape Dental Injury: Teeth and Oropharynx as per pre-operative assessment

## 2019-09-28 NOTE — Anesthesia Procedure Notes (Addendum)
Anesthesia Regional Block: Popliteal block   Pre-Anesthetic Checklist: ,, timeout performed, Correct Patient, Correct Site, Correct Laterality, Correct Procedure, Correct Position, site marked, Risks and benefits discussed, pre-op evaluation,  At surgeon's request and post-op pain management  Laterality: Left  Prep: Maximum Sterile Barrier Precautions used, chloraprep       Needles:  Injection technique: Single-shot  Needle Type: Echogenic Needle     Needle Length: 9cm  Needle Gauge: 21     Additional Needles:   Procedures:,,,, ultrasound used (permanent image in chart),,,,  Narrative:  Start time: 09/28/2019 11:31 AM End time: 09/28/2019 11:40 AM Injection made incrementally with aspirations every 5 mL.  Performed by: Personally  Anesthesiologist: Barnet Glasgow, MD  Additional Notes: Block assessed. Patient tolerated procedure well.

## 2019-09-28 NOTE — Progress Notes (Signed)
Orthopedic Tech Progress Note Patient Details:  Rodney [name] 12/03/1950 [id number]  [facility] and ordered outside vendor brace: Prosthetic - Vive Protocol Bk, Left Tammy Sours 09/28/2019, 2:18 PM

## 2019-09-29 ENCOUNTER — Encounter (HOSPITAL_COMMUNITY)
Admission: EM | Disposition: A | Discharge status: Skilled Nursing Facility | Payer: Self-pay | Source: Home / Self Care | Attending: Family Medicine

## 2019-09-29 DIAGNOSIS — I110 Hypertensive heart disease with heart failure: Secondary | ICD-10-CM

## 2019-09-29 LAB — BASIC METABOLIC PANEL
Anion gap: 8 (ref 5–15)
Anion gap: 11 (ref 5–15)
BUN: 47 mg/dL — ABNORMAL HIGH (ref 8–23)
BUN: 39 mg/dL — ABNORMAL HIGH (ref 8–23)
CO2: 24 mmol/L (ref 22–32)
CO2: 23 mmol/L (ref 22–32)
Calcium: 7.8 mg/dL — ABNORMAL LOW (ref 8.9–10.3)
Calcium: 7.7 mg/dL — ABNORMAL LOW (ref 8.9–10.3)
Chloride: 98 mmol/L (ref 98–111)
Chloride: 95 mmol/L — ABNORMAL LOW (ref 98–111)
Creatinine, Ser: 1.68 mg/dL — ABNORMAL HIGH (ref 0.61–1.24)
Creatinine, Ser: 1.57 mg/dL — ABNORMAL HIGH (ref 0.61–1.24)
GFR calc Af Amer: 48 mL/min — ABNORMAL LOW (ref >60)
GFR calc Af Amer: 52 mL/min — ABNORMAL LOW (ref >60)
GFR calc non Af Amer: 41 mL/min — ABNORMAL LOW (ref >60)
GFR calc non Af Amer: 45 mL/min — ABNORMAL LOW (ref >60)
Glucose, Bld: 221 mg/dL — ABNORMAL HIGH (ref 70–99)
Glucose, Bld: 176 mg/dL — ABNORMAL HIGH (ref 70–99)
Potassium: 5.1 mmol/L (ref 3.5–5.1)
Potassium: 4.4 mmol/L (ref 3.5–5.1)
Sodium: 130 mmol/L — ABNORMAL LOW (ref 135–145)
Sodium: 129 mmol/L — ABNORMAL LOW (ref 135–145)

## 2019-09-29 LAB — CBC
HCT: 26.6 % — ABNORMAL LOW (ref 39.0–52.0)
Hemoglobin: 8.4 g/dL — ABNORMAL LOW (ref 13.0–17.0)
MCH: 29.4 pg (ref 26.0–34.0)
MCHC: 31.6 g/dL (ref 30.0–36.0)
MCV: 93 fL (ref 80.0–100.0)
Platelets: 408 10*3/uL — ABNORMAL HIGH (ref 150–400)
RBC: 2.86 MIL/uL — ABNORMAL LOW (ref 4.22–5.81)
RDW: 13.2 % (ref 11.5–15.5)
WBC: 10.2 10*3/uL (ref 4.0–10.5)
nRBC: 0 % (ref 0.0–0.2)

## 2019-09-29 LAB — GLUCOSE, CAPILLARY
Glucose-Capillary: 211 mg/dL — ABNORMAL HIGH (ref 70–99)
Glucose-Capillary: 221 mg/dL — ABNORMAL HIGH (ref 70–99)
Glucose-Capillary: 157 mg/dL — ABNORMAL HIGH (ref 70–99)

## 2019-09-29 LAB — SURGICAL PATHOLOGY

## 2019-09-29 SURGERY — AMPUTATION BELOW KNEE
Anesthesia: General | Site: Knee | Laterality: Left

## 2019-09-29 MED ORDER — IPRATROPIUM-ALBUTEROL 0.5-2.5 (3) MG/3ML IN SOLN
3.0000 mL | RESPIRATORY_TRACT | Status: DC
  Administered 2019-09-29: 3 mL via RESPIRATORY_TRACT
  Filled 2019-09-29: qty 3

## 2019-09-29 MED ORDER — ENOXAPARIN SODIUM 40 MG/0.4ML ~~LOC~~ SOLN
40.0000 mg | SUBCUTANEOUS | Status: DC
  Administered 2019-09-29 – 2019-10-01 (×3): 40 mg via SUBCUTANEOUS
  Filled 2019-09-29 (×3): qty 0.4

## 2019-09-29 MED ORDER — CARVEDILOL 3.125 MG PO TABS
3.1250 mg | ORAL_TABLET | Freq: Two times a day (BID) | ORAL | Status: DC
  Administered 2019-09-29 – 2019-09-30 (×2): 3.125 mg via ORAL
  Filled 2019-09-29 (×2): qty 1

## 2019-09-29 MED ORDER — ACETAMINOPHEN 325 MG PO TABS
650.0000 mg | ORAL_TABLET | Freq: Four times a day (QID) | ORAL | Status: DC
  Administered 2019-09-29 – 2019-10-09 (×37): 650 mg via ORAL
  Filled 2019-09-29 (×37): qty 2

## 2019-09-29 MED ORDER — FUROSEMIDE 10 MG/ML IJ SOLN
40.0000 mg | Freq: Every day | INTRAMUSCULAR | Status: DC
  Administered 2019-09-29 – 2019-10-02 (×4): 40 mg via INTRAVENOUS
  Filled 2019-09-29 (×4): qty 4

## 2019-09-29 MED ORDER — IPRATROPIUM-ALBUTEROL 0.5-2.5 (3) MG/3ML IN SOLN
3.0000 mL | Freq: Four times a day (QID) | RESPIRATORY_TRACT | Status: DC
  Administered 2019-09-29 – 2019-10-01 (×7): 3 mL via RESPIRATORY_TRACT
  Filled 2019-09-29 (×7): qty 3

## 2019-09-29 MED ORDER — INSULIN GLARGINE 100 UNIT/ML ~~LOC~~ SOLN
25.0000 [IU] | Freq: Every day | SUBCUTANEOUS | Status: DC
  Administered 2019-09-29: 25 [IU] via SUBCUTANEOUS
  Filled 2019-09-29 (×2): qty 0.25

## 2019-09-29 NOTE — Progress Notes (Signed)
Patient ID: Rodney [name], male   DOB: October 15, 1950, 70 y.o.   MRN: [id number] POD 1 left BKA, patient without complaints this am, VAC without drainage, wearing the stump shrinker, plan for discharge pending recommendations of PT

## 2019-09-29 NOTE — Care Management Important Message (Signed)
Important Message  Patient Details  Name: DJIBRIL GLOGOWSKI MRN: [id number] Date of Birth: 23-Aug-1950   Medicare Important Message Given:  Yes     Shelda Altes 09/29/2019, 12:17 PM

## 2019-09-29 NOTE — Progress Notes (Signed)
Inpatient Rehab Admissions Coordinator Note:   Per PT recommendations, pt was screened for CIR candidacy by Shann Medal, PT, DPT.  At this time we are recommending a CIR consult.  I will contact MD for an order.  Please contact me with questions.   Shann Medal, PT, DPT [phone] 09/29/19 1:50 PM

## 2019-09-29 NOTE — Progress Notes (Addendum)
Inpatient Diabetes Program Recommendations  AACE/ADA: New Consensus Statement on Inpatient Glycemic Control   Target Ranges:  Prepandial:   less than 140 mg/dL      Peak postprandial:   less than 180 mg/dL (1-2 hours)      Critically ill patients:  140 - 180 mg/dL   Results for DONTAVIS, TSCHANTZ (MRN [id number]) as of 09/29/2019 10:41  Ref. Range 09/28/2019 06:30 09/28/2019 10:37 09/28/2019 13:41 09/28/2019 16:54 09/28/2019 21:28 09/29/2019 06:20  Glucose-Capillary Latest Ref Range: 70 - 99 mg/dL 149 (H) 156 (H) 167 (H) 196 (H) 228 (H) 211 (H)  Results for ALBIE, ARIZPE (MRN [id number]) as of 09/29/2019 10:41  Ref. Range 09/26/2019 21:34  Hemoglobin A1C Latest Ref Range: 4.8 - 5.6 % 9.2 (H)   Review of Glycemic Control  Diabetes history: DM2 Outpatient Diabetes medications: 70/30 75 units QAM, 70/30 55 units QPM, Metformin 1000 mg BID Current orders for Inpatient glycemic control: Lantus 20 units QHS, Novolog 0-15 units TID with meals, Novolog 0-5 units QHS  Inpatient Diabetes Program Recommendations:   Insulin-Meal Coverage: Please consider ordering Novolog 3 units TID with meals for meal coverage if patient eats at least 50% of meals.  HgbA1C: A1C 9.2% on 09/26/2019 indicating an average glucose of 217 mg/dl over the past 2-3 months.  Addendum 09/29/19_0 :20-Spoke with patient regarding DM control and A1C. Patient states that he is taking 70/30 75 units QAM, 70/30 55 units QPM, and Metformin 1000 mg BID for DM control. Patient states he takes DM medications consistently except when he is out of medications. Patient states that he is rarely out of medications and notes he has DM medications at home. Inquired about glucose control at home and patient notes that he is not checking glucose at all at home. He states he got a glucometer from [facility] about 10 years ago and when he checked his glucose for the first time it was 300 mg/dl so he felt that it was wrong and did not check glucose any longer. Discussed  A1C of 9.2% on 09/26/19 indicating an average glucose of 217 mg/dl over the past 2-3 months. Patient states that his doctor keeps a check on his DM. Discussed A1C and glucose goals. Reviewed how hyperglycemia causes damage within blood vessels and leads to complications with uncontrolled DM. Patient stated that he has had DM for 10 years and he has heard it all before and he knows what to do about his DM. Explained importance of good DM control follow surgery and again patient stated he has already heard all this and thanked me for telling him again and for my time. Encouraged patient to start checking glucose, take DM medications consistently, and follow up with PCP regarding improving DM control. Patient verbalized understanding of information and states he has no questions at this time related to DM.  At time of discharge, please provide Rx for: glucose monitoring kit [id number]).  Thanks, Barnie Alderman, RN, MSN, CDE Diabetes Coordinator Inpatient Diabetes Program [phone] (Team Pager from 8am to 5pm)

## 2019-09-29 NOTE — Progress Notes (Addendum)
Family Medicine Teaching Service Daily Progress Note Intern Pager: [phone]  Patient name: Rodney [name] Medical record number: [id number] Date of birth: May 03, 1950 Age: 69 y.o. Gender: male  Primary Care Provider: [name], MD Consultants: Orthopedics Code Status: Full code  Pt Overview and Major Events to Date:   Assessment and Plan: Rodney [name] is a 69 y.o. male presenting with left foot cellulitis/ulceration. PMH is significant for hypertension, hyperlipidemia, tobacco abuse, type 2 diabetes, dyspnea, peripheral edema.  Left foot cellulitis  Was assessed in the emergency department given rapid progression was admitted and Ortho was consulted.  Plan for below the knee amputation today.  Patient was initially started on Zosyn and vancomycin in the ED but due to vancomycin shortage this was discontinued and patient was continued on Zosyn and started on linezolid.  WBC this morning was 10.2 from 8.8 yesterday. 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  Right ventricular systolic function is mildly reduced as well.  Left and right atria are mildly dilated.  Given heart failure findings we have consulted cardiology. -Cardiology consulted, appreciate recommendations -Continue 40 mg IV Lasix twice daily but may require increased dose -Continue aspirin -Low-dose Coreg 3.125 mg twice daily -Consider ARB or Entresto prior to discharge depending on renal function -We will ultimately need left heart cath but not at this time -Counseled on smoking cessation  Tachycardia with dyspnea Patient has remained tachycardic in the low one twenties.  Given his echo findings showing HFrEF diuresis was  initiated.  Patient's work of breathing has improved, breath sounds have improved as well but I do note intermittent wheezing and crackles in lung bases..  VQ scan showed intermediate risk for PE.  CT performed which was negative for PE.  Discussion regarding possible alcohol withdrawal so CIWA's initiated although patient denies alcohol use. 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  Medications include Novolin 70/30 75 units in the morning and 55 units in the evening, Metformin 1000 mg twice daily -Holding insulin right now -Lantus 25 units daily -Moderate SSI -CBG monitoring -Hold Metformin  AKI Creatinine on admission was 2.08 with baseline around 1-1.1.  Patient received 3 L of lactated Ringer and the ED. creatinine this morning is at 1.68 from 1.67 yesterday -Morning BMPs -Maintenance IV fluids -Avoid nephrotoxic agents -Holding lisinopril-HCTZ combo -Giving Lasix 40 mg IV twice daily  Peripheral edema Medications include furosemide 40 mg daily -Giving Lasix 40 mg IV given new HFrEF  Hyperlipidemia Home medication includes atorvastatin 40 mg daily -Continue home atorvastatin  Hypertension Home medication includes lisinopril-hydrochlorothiazide 20-25 mg daily.  Patient has been hypotensive with Bps ranging from 82/64-101/70 -Holding lisinopril-hydrochlorothiazide and Lasix at this time in the setting of hypotension and AKI -Consider restarting when appropriate  History of tobacco abuse Patient reportedly recently quit smoking -Recommend continued cessation  FEN/GI: N.p.o. for surgery PPx: Heparin drip  Disposition: Pending orthopedics procedure and control of infection  Subjective:  Patient reports he feels better this morning.  He admits that he had an episode of  disorientation last night because he pulled his nasal cannula off  and then got confused.  He is alert and oriented this morning to person, place, time.  He reports his pain is improved and his breathing is also improved.  Objective: Temp:  [97.2 F (36.2 C)-98.4 F (36.9 C)] 98.3 F (36.8 C) (06/04 0404) Pulse Rate:  [117-124] 118 (06/04 0404) Resp:  [18-39] 23 (06/04 0300) BP: (100-146)/(67-109) 146/78 (06/04 0404) SpO2:  [90 %-100 %] 94 % (06/04 0404) Weight:  [121.1 kg] 121.1 kg (06/03 1127) Physical Exam: General: Lying in bed, eating breakfast at the time I evaluate Cardiovascular: Patient remains tachycardic in the one twenties but regular rhythm Respiratory: Normal work of breathing, few crackles noted in lung bases, air movement is improved from previous evaluations Abdomen: Soft, nontender, positive bowel sounds Extremities: Right lower extremities nonedematous.  Patient is s/p left BKA, bandages intact with wound VAC in place  Laboratory: Recent Labs  Lab 09/28/19 0251 09/28/19 1831 09/29/19 0352  WBC 12.1* 8.8 10.2  HGB 8.6* 8.1* 8.4*  HCT 27.3* 25.4* 26.6*  PLT 381 384 408*   Recent Labs  Lab 09/26/19 1449 09/27/19 0301 09/28/19 0727 09/28/19 1831 09/29/19 0352  NA 129*   < > 129* 128* 130*  K 4.6   < > 4.8 4.9 5.1  CL 90*   < > 98 96* 98  CO2 26   < > 21* 23 24  BUN 64*   < > 59* 49* 47*  CREATININE 2.08*   < > 1.79* 1.67* 1.68*  CALCIUM 9.4   < > 7.9* 7.8* 7.8*  PROT 6.9  --   --   --   --   BILITOT 0.8  --   --   --   --   ALKPHOS 198*  --   --   --   --   ALT 71*  --   --   --   --   AST 53*  --   --   --   --   GLUCOSE 194*   < > 147* 217* 221*   < > = values in this interval not displayed.  Lactic acid-1.1 Heparin-0.22 Glucose-143 EKG-sinus tachycardia  Imaging/Diagnostic Tests: CT ANGIO CHEST PE W OR WO CONTRAST  Result Date: 09/28/2019 CLINICAL DATA:  Shortness of breath EXAM: CT ANGIOGRAPHY CHEST WITH CONTRAST TECHNIQUE: Multidetector CT  imaging of the chest was performed using the standard protocol during bolus administration of intravenous contrast. Multiplanar CT image reconstructions and MIPs were obtained to evaluate the vascular anatomy. CONTRAST:  66mL OMNIPAQUE IOHEXOL 350 MG/ML SOLN COMPARISON:  Chest radiograph September 26, 2019; lung scan September 27, 2019 FINDINGS: Cardiovascular: There is no demonstrable pulmonary embolus. There is no thoracic aortic aneurysm or dissection. There are scattered foci of calcification in the visualized great vessels. There is aortic atherosclerosis. There are scattered foci of coronary artery calcification. There is no pericardial effusion or pericardial thickening. There is calcification in the mitral annulus. Mediastinum/Nodes: Visualized thyroid appears normal. There are scattered subcentimeter mediastinal lymph nodes. There is adenopathy in the subcarinal region. The confluence of adenopathy in the subcarinal region measures 3.4 x 2.3 cm. No other adenopathy by size criteria. No esophageal lesions evident. Lungs/Pleura: There are fairly small pleural effusions bilaterally with patchy airspace opacity in both lung bases, slightly more on the right than on the left. There is an area of apparent consolidation in a portion of the inferior lingula. There is also a focal area of airspace opacity in the posterior aspect of the apical segment  of the right upper lobe. More patchy infiltrate is noted in the periphery of the medial segment of the right middle lobe. There is calcification along the left hemidiaphragm. Upper Abdomen: There is reflux of contrast into the inferior vena cava and hepatic veins. There is upper abdominal aortic atherosclerosis. Visualized upper abdominal structures otherwise appear normal. Musculoskeletal: There are no blastic or lytic bone lesions. No chest wall lesions are evident. Review of the MIP images confirms the above findings. IMPRESSION: 1. No demonstrable pulmonary embolus. No thoracic  aortic aneurysm or dissection. There is aortic atherosclerosis as well as foci of coronary artery and great vessel arterial vascular calcification. 2. Pleural effusions bilaterally, slightly larger on the right than on the left. Multifocal areas of airspace opacity consistent with multifocal pneumonia. Pneumonia in most notable in the lower lobes but also present at several other sites. 3. Subcarinal adenopathy of uncertain etiology. This adenopathy may well be secondary to the parenchymal lung changes. Neoplastic etiology cannot be entirely excluded, however. Multiple subcentimeter thoracic lymph nodes noted elsewhere. 4. Reflux of contrast into the inferior vena cava and hepatic veins, a finding indicative of increased right heart pressure. 5. Calcification along the left hemidiaphragm. Question residua of previous infection in this area. Asbestos exposure could present in this manner, although there are no other changes elsewhere to suggest asbestos exposure. Aortic Atherosclerosis (ICD10-I70.0). Electronically Signed   By: Lowella Grip III M.D.   On: 09/28/2019 10:16   ECHOCARDIOGRAM COMPLETE  Result Date: 09/28/2019    ECHOCARDIOGRAM REPORT   Patient Name:   JAGER KOSKA Date of Exam: 09/28/2019 Medical Rec #:  [id number]         Height:       72.0 in Accession #:    [id number]        Weight:       267.0 lb Date of Birth:  05-09-1950        BSA:          2.409 m Patient Age:    53 years          BP:           114/76 mmHg Patient Gender: M                 HR:           120 bpm. Exam Location:  Inpatient Procedure: 2D Echo, Color Doppler, Cardiac Doppler and Intracardiac            Opacification Agent Indications:    R06.9 DOE  History:        Patient has no prior history of Echocardiogram examinations.                 Risk Factors:Hypertension, Diabetes and Dyslipidemia.  Sonographer:    Raquel Sarna Senior RDCS Referring Phys: [place]  Sonographer Comments: Technically difficult study due to poor  echo windows. IMPRESSIONS  1. Assessment of his LV function is difficult. Consider repeat echo with definity contrast once HR is slower . Marland Kitchen Left ventricular ejection fraction, by estimation, is 35 to 40%. The left ventricle has moderately decreased function. The left ventricle demonstrates global hypokinesis. Left ventricular diastolic parameters are indeterminate.  2. Right ventricular systolic function is mildly reduced. The right ventricular size is normal.  3. Left atrial size was mildly dilated.  4. Right atrial size was mildly dilated.  5. The mitral valve is normal in structure. Mild mitral valve regurgitation. No evidence of mitral stenosis.  6. The aortic valve is not well visualized in the short axis view but is likely a 3 leaflet valve .Marland Kitchen The aortic valve is tricuspid. Aortic valve regurgitation is not visualized. Moderate aortic valve stenosis. FINDINGS  Left Ventricle: Assessment of his LV function is difficult. Consider repeat echo with definity contrast once HR is slower. Left ventricular ejection fraction, by estimation, is 35 to 40%. The left ventricle has moderately decreased function. The left ventricle demonstrates global hypokinesis. Definity contrast agent was given IV to delineate the left ventricular endocardial borders. The left ventricular internal cavity size was normal in size. There is borderline left ventricular hypertrophy. Left ventricular diastolic function could not be evaluated due to atrial fibrillation. Left ventricular diastolic parameters are indeterminate. Right Ventricle: The right ventricular size is normal. No increase in right ventricular wall thickness. Right ventricular systolic function is mildly reduced. Left Atrium: Left atrial size was mildly dilated. Right Atrium: Right atrial size was mildly dilated. Pericardium: There is no evidence of pericardial effusion. Mitral Valve: The mitral valve is normal in structure. Mild mitral valve regurgitation. No evidence of  mitral valve stenosis. Tricuspid Valve: The tricuspid valve is not well visualized. Tricuspid valve regurgitation is not demonstrated. Aortic Valve: The aortic valve is not well visualized in the short axis view but is likely a 3 leaflet valve. The aortic valve is tricuspid. . There is moderate thickening and moderate calcification of the aortic valve. Aortic valve regurgitation is not visualized. Moderate aortic stenosis is present. There is moderate thickening of the aortic valve. There is moderate calcification of the aortic valve. Aortic valve mean gradient measures 25.0 mmHg. Aortic valve peak gradient measures 37.5 mmHg. Aortic valve area, by VTI measures 1.29 cm. Pulmonic Valve: The pulmonic valve was normal in structure. Pulmonic valve regurgitation is not visualized. No evidence of pulmonic stenosis. Aorta: The aortic root and ascending aorta are structurally normal, with no evidence of dilitation. IAS/Shunts: The atrial septum is grossly normal.  LEFT VENTRICLE PLAX 2D LVIDd:         5.30 cm  Diastology LVIDs:         4.50 cm  LV e' lateral:   8.70 cm/s LV PW:         1.10 cm  LV E/e' lateral: 14.1 LV IVS:        1.30 cm  LV e' medial:    5.98 cm/s LVOT diam:     2.30 cm  LV E/e' medial:  20.6 LV SV:         78 LV SV Index:   32 LVOT Area:     4.15 cm  RIGHT VENTRICLE RV S prime:     9.25 cm/s TAPSE (M-mode): 1.6 cm LEFT ATRIUM             Index       RIGHT ATRIUM           Index LA diam:        5.20 cm 2.16 cm/m  RA Area:     22.60 cm LA Vol (A2C):   85.0 ml 35.29 ml/m RA Volume:   64.80 ml  26.90 ml/m LA Vol (A4C):   81.9 ml 34.00 ml/m LA Biplane Vol: 86.9 ml 36.07 ml/m  AORTIC VALVE AV Area (Vmax):    1.29 cm AV Area (Vmean):   1.17 cm AV Area (VTI):     1.29 cm AV Vmax:           306.00 cm/s AV Vmean:  240.000 cm/s AV VTI:            0.606 m AV Peak Grad:      37.5 mmHg AV Mean Grad:      25.0 mmHg LVOT Vmax:         95.00 cm/s LVOT Vmean:        67.700 cm/s LVOT VTI:          0.188  m LVOT/AV VTI ratio: 0.31  AORTA Ao Root diam: 3.60 cm Ao Asc diam:  3.50 cm MITRAL VALVE MV Area (PHT): 6.71 cm     SHUNTS MV Decel Time: 113 msec     Systemic VTI:  0.19 m MV E velocity: 123.00 cm/s  Systemic Diam: 2.30 cm Mertie Moores MD Electronically signed by Mertie Moores MD Signature Date/Time: 09/28/2019/4:14:20 PM    Final      Gifford Shave, MD 09/29/2019, 5:51 AM PGY-1, [place] Intern pager: [phone], text pages welcome

## 2019-09-29 NOTE — TOC Initial Note (Signed)
Transition of Care [facility]) - Initial/Assessment Note    Patient Details  Name: Rodney [name] MRN: [id number] Date of Birth: [birth year]  [facility] CM/SW Contact:    Verdell Carmine, RN Phone Number: 09/29/2019, 2:34 PM  Clinical Narrative:                 Came into room to talk to patient about recommendation of PT. Patient voicing  That PT is probably very upset with me, because I would not do PT, I just got my leg chopped off 2 days ago and I don't feel like doing it right now!"  Spoke to patient about  Recommendation of CIR. I stated nothing was said about patient tin that way. He vocalized that he knows the "drill" he has been in accidents before, Inpatient rehab, [employer], PT , work the programs.  I need time to adjust.  I told him  I understood that point of view. I will follow up with him again. He may need a different approach to  PT/OT to get him involved, it is likely going to be when he is ready to participate and has worked through his grieving process for his limb.   Expected Discharge Plan: IP Rehab Facility Barriers to Discharge: (P) Continued Medical Work up   Patient Goals and CMS Choice Patient states their goals for this hospitalization and ongoing recovery are:: for now I have to get through the fact that I dont have a leg      Expected Discharge Plan and Services Expected Discharge Plan: [place] In-house Referral: (P) NA Discharge Planning Services: CM Consult Post Acute Care Choice: (P) IP Rehab Living arrangements for the past 2 months: Single Family Home                                      Prior Living Arrangements/Services Living arrangements for the past 2 months: Single Family Home Lives with:: Significant Other Patient language and need for interpreter reviewed:: Yes        Need for Family Participation in Patient Care: Yes (Comment) Care giver support system in place?: Yes (comment)   Criminal Activity/Legal  Involvement Pertinent to Current Situation/Hospitalization: No - Comment as needed  Activities of Daily Living      Permission Sought/Granted                  Emotional Assessment Appearance:: Appears stated age Attitude/Demeanor/Rapport: Avoidant, Angry Affect (typically observed): Blunt, Depressed Orientation: : Oriented to Self, Oriented to Place, Oriented to  Time, Oriented to Situation   Psych Involvement: No (comment)  Admission diagnosis:  Necrotizing fasciitis ([place]) [M72.6] Cellulitis [L03.90] 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 Sepsis ([place]) 09/28/2019  . HFrEF (heart failure with reduced ejection fraction) ([place]) 09/28/2019  . Moderate aortic stenosis 09/28/2019  . Anemia in chronic illness 09/27/2019  . Necrotizing fasciitis ([place]) 09/26/2019  . 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 Severe protein-calorie malnutrition ([place])   . PVD (peripheral vascular disease) ([place])   . Cellulitis 09/22/2019  . Foot ulcer ([place]) 09/22/2019  . Dyspnea 07/26/2018  . Bilateral otitis media 06/22/2018  . Peripheral edema 02/24/2018  . Umbilical hernia 23/53/6144  . Obesity 10/09/2015  . Tobacco abuse 09/07/2013  . Uncontrolled type 2 diabetes mellitus with diabetic  neuropathy, with long-term current use of insulin ([place]) 09/07/2013  . Colon cancer screening 09/07/2013  . Hypercholesteremia 10/28/2010  . ERECTILE DYSFUNCTION 05/22/2009  . HYPERTENSION 01/23/2009   PCP:  [name], MD Pharmacy:   [facility] [place] [address] [place] [address] Phone: [phone] Fax: [facility] [place], [place] [address] Phone: [phone] Fax: [phone]  [facility] [address] [place]), [place] - [address] [place] ([place]) [place] [address] Phone: [phone] Fax: [phone]     Social Determinants of Health (SDOH) Interventions    Readmission Risk Interventions No flowsheet data found.

## 2019-09-29 NOTE — Progress Notes (Signed)
Pharmacy Antibiotic Note  Rodney [name] is a 69 y.o. male admitted on 09/26/2019 with Osteo with concerns for nec fascitis .  Pharmacy has been consulted for Zosyn dosing; he is also on Zyvox. He is s/p left BKA on 6/3. Ortho recommended at least 3 days post-op abx. Multifocal PNA noted on CT chest. He is afebrile, WBC down to 14.8, and SCr down to 1.68.   Height: 6' (182.9 cm) Weight: 121.1 kg (267 lb) IBW/kg (Calculated) : 77.6  Temp (24hrs), Avg:98.2 F (36.8 C), Min:97.8 F (36.6 C), Max:98.4 F (36.9 C)  Recent Labs  Lab 09/26/19 1449 09/26/19 1510 09/27/19 0301 09/28/19 0251 09/28/19 0727 09/28/19 1831 09/29/19 0352  WBC 17.4*  --  17.3* 12.1*  --  8.8 10.2  CREATININE 2.08*  --  2.12*  --  1.79* 1.67* 1.68*  LATICACIDVEN  --  1.8 1.1  --   --   --   --     Estimated Creatinine Clearance: 57.3 mL/min (A) (by C-G formula based on SCr of 1.68 mg/dL (H)).    No Known Allergies  Antimicrobials this admission: Clinda 6/1 >> 6/2 Zosyn 6/1 >>  Vancomycin 1750 mg IV x 1 dose 6/1 Zyvox 6/2 >>  Dose adjustments this admission:   Microbiology results: 6/1 BCx: ngtd 6/2 MRSA PCR neg  Plan:  - Zosyn 3.375g IV every 8 hours infused over 4 hours  - F/U LOT for abx - Monitor renal function, clinical progress, cultures  Thank you for involving pharmacy in this patient's care.  Renold Genta, PharmD, BCPS Clinical Pharmacist Clinical phone for 09/29/2019 until 3p is [phone] 09/29/2019 2:19 PM  **Pharmacist phone directory can be found on [email or web address] listed under [place]**

## 2019-09-29 NOTE — Hospital Course (Addendum)
Rodney [name] is a 69 y.o. male who presented for cellulitis of the left foot that was not responsive to outpatient management.  Past medical history significant for hypertension, hyperlipidemia, tobacco abuse, type 2 diabetes, dyspnea, peripheral edema.  Below is his hospital course by problem.   Left foot cellulitis/sepsis/osteomyelitis/necrotizing fasciitis Patient was diagnosed with cellulitis approximately week prior to admission.  He was prescribed Bactrim but over the week his cellulitis worsened so he presented to the emergency department.  On evaluation there was concern for osteomyelitis as well as sepsis the patient was started on Zosyn and vancomycin as well as clindamycin.  Surgery was consulted and he was scheduled for BKA.  Out of concern for shortness of breath and possible pulmonary embolism the surgery was held until he could have a CT to rule out pulmonary embolism.  Antibiotics were transitioned to Zosyn and linezolid, the vancomycin and clindamycin were discontinued.  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  Antibiotics were continued for an additional 3 days postop per surgery's recommendations.  Newly diagnosed HFrEF On admission patient was found to be tachycardic, tachypneic with lower extremity edema.  He was given fluids given meeting SIRS criteria but his tachycardia and tachypnea showed no improvement.  He was diuresed due to new lung crackles and showed mild improvement.  There was concern for heart failure so an echo was ordered.  Echo showed LVEF of 35 to 40% with LV decreased function.  RV systolic function was mildly reduced.  Left and right atrium mildly dilated.  Cardiology was consulted and recommended continuation Lasix diuresis as well as starting low-dose Coreg 3.125 mg twice daily.  They recommended consideration of ARB or Entresto prior to discharge depending renal function.  Right heart cath completed on 6/8 showing complete occlusion of  proximal RCA with mild stenosis of left sided circulation.  They recommended medical management.  Patient was started on losartan 25 mg daily, dapsone 12.5 mg daily and metoprolol tartrate.  Prior to discharge she was transitioned from metoprolol tartrate to Coreg.  He was also started on Lasix 40 mg daily.  At discharge patient was continued on furosemide which was reduced to 20 mg daily, spironolactone 12.5 mg daily, losartan 25 mg daily, carvedilol 12.5 mg twice daily and empagliflozin 10 mg daily.  A Fib  Patient with new onset a fib documented on 10/01/2019. Cariology already following and recommends Industry vs warfarin.  Patient was started on Eliquis 5 mg twice daily and rate control with metoprolol 25 mg every 6 hours.  He was scheduled for a TEE/DCCV on 6/10.  TEE/DCCV was completed on 6/10 and patient was in normal sinus rhythm.  Cardiology recommended switching from metoprolol to Coreg which was done.  At discharge patient was continued on Coreg as well as started on amiodarone for 1 month.  He will take 400 mg twice daily for 1 week ending 10/09/19, and then 200 mg daily for 3 weeks.   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  He was on Novolin 70/30 75 units in the morning and 55 units in the evening at home as well as Metformin 1000 mg twice daily.  While admitted he was given Lantus 20 units daily which was increased to 25 units daily as well as a moderate sensitivity sliding scale.  His sugars were still difficult to control so his Lantus was increased to 30 units.  He remained at 30 units except for  days of procedures where his Lantus was decreased to 20 units due to his n.p.o. status.  Given new diagnosis of heart failure and CAD patient was started on empagliflozin. Patient was switched to lantus on discharge as a new home medication.  AKI Creatinine on admission was 2.08 with his baseline being 1-1 0.1.  Patient was give 3 L of  lactated Ringer's in the ED with no improvement on next creatinine check.  Home Lasix and lisinopril-hydrochlorothiazide combo were held due to the AKI but given no improvement after fluids and concern for cardiorenal response patient was started on Lasix and showed improvement of creatinine to 1.68.  Creatinine continued to trend down and was 1.48 on the day of discharge after starting jardiance. Because of this his lasix was cut to 20mg /day oral at time of discharge.

## 2019-09-29 NOTE — Care Management (Signed)
Maxwell Caul they were checking on wheelchair and cost. Meanwhile patient discharged. [facility]) called back. He can get a wheelchair for 5 dollars a month.  I called the home phone and spoke to his mother. I will circle back with Enzo Montgomery from [place] and have him call the patients mother to set up.

## 2019-09-29 NOTE — Consult Note (Addendum)
Cardiology Consultation:   Patient ID: OAKLAND FANT MRN: [id number]; DOB: December 13, 1950  Admit date: 09/26/2019 Date of Consult: 09/29/2019  Primary Care Provider: [name], MD [facility] Cardiologist: No primary care provider on file. new [place] Electrophysiologist:  None    Patient Profile:   Rodney [name] is a 69 y.o. male with no documented hx but now with acute cellulitis of Lt foot, tachycardia, dyspnea, DM, AKI HTN, tobacco use and no hx of CAD who is being seen today for the evaluation of of new cardiomyopathy, CHF, and admit with sepsis, post amputation at the request of Dr. Owens Shark.  History of Present Illness:   Rodney [name] with hx of  DM on insulin, hyperlipidemia, and infection of Lt foot was found to have acute cellulitis and started on ABX, +abscess ulceration and osteomyelitis left foot with necrotic toes and cellulitis proximal to the ankle.  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   Last pm pt's respiration increased, wheezing, HR in 120-130s. Nebs given.   EKG:  The EKG was personally reviewed and demonstrates:  ST with PACs  No ST changes Telemetry:  Telemetry was personally reviewed and demonstrates:  ST with pac and PVC Echo with poor windows but 35-40% EF, global hypokinesis, borderline LVH,  RV size is mildly reduced both RA and LA mildly dilated.  Moderate AS Aortic valve mean gradient measures 25.0 mmHg. Aortic valve peak gradient measures 37.5 mmHg. Aortic valve area, by VTI measures 1.29 cm  CTA of chest with PNA, pl effusion and aortic atherosclerosis.   Now BP 146/78 to 121/84 P 120 afebrile, resp now 20  He notes prior to this acute issue with ambulation he is DOE and with chest tightness, with rest it resolves.  No awareness of rapid HR.  No FH of CAD.  He does snore heavily with sleep, no test for sleep apnea.     Past Medical History:  Diagnosis Date  . Diabetes mellitus without  complication ([place])   . HLD (hyperlipidemia)   . Hypertension     Past Surgical History:  Procedure Laterality Date  . AMPUTATION Left 09/28/2019   Procedure: AMPUTATION BELOW KNEE;  Surgeon: Newt Minion, MD;  Location: [place];  Service: Orthopedics;  Laterality: Left;     Home Medications:  Prior to Admission medications   Medication Sig Start Date End Date Taking? Authorizing Provider  aspirin 81 MG tablet Take 81 mg by mouth daily.     Yes [provider]  atorvastatin (LIPITOR) 40 MG tablet TAKE 1 TABLET (40 MG TOTAL) BY MOUTH AT BEDTIME. Patient taking differently: Take 40 mg by mouth daily.  12/19/18  Yes Hensel, Jamal Collin, MD  furosemide (LASIX) 40 MG tablet Take 1 tablet by mouth once daily Patient taking differently: Take 40 mg by mouth daily.  08/02/19  Yes Hensel, Jamal Collin, MD  glucose blood (ACCU-CHEK AVIVA) test strip Use as instructed Patient taking differently: 1 each by Other route as directed.  04/14/17  Yes Hensel, Jamal Collin, MD  insulin NPH-regular Human (NOVOLIN 70/30 RELION) (70-30) 100 UNIT/ML injection 70 units every morning and 50 units every evening. Patient taking differently: Inject 50-70 Units into the skin See admin instructions. Inject 75 units every morning and 50 units every evening. 10/25/18  Yes Hensel, Jamal Collin, MD  lisinopril-hydrochlorothiazide (ZESTORETIC) 20-25 MG tablet TAKE 1 TABLET EVERY DAY Patient taking differently: Take 1 tablet by mouth daily.  12/19/18  Yes  [name], MD  metFORMIN (GLUCOPHAGE) 1000 MG tablet TAKE 1 TABLET (1,000 MG TOTAL) BY MOUTH 2 (TWO) TIMES DAILY WITH A MEAL. 12/19/18  Yes Hensel, Jamal Collin, MD  sulfamethoxazole-trimethoprim (BACTRIM DS) 800-160 MG tablet Take 1 tablet by mouth 2 (two) times daily. 09/21/19  Yes Guadalupe Dawn, MD    Inpatient Medications: Scheduled Meds: . aspirin EC  81 mg Oral Daily  . atorvastatin  40 mg Oral QHS  . docusate sodium  100 mg Oral BID  . furosemide  40 mg Intravenous  Daily  . insulin aspart  0-15 Units Subcutaneous TID WC  . insulin aspart  0-5 Units Subcutaneous QHS  . insulin glargine  20 Units Subcutaneous QHS   Continuous Infusions: . sodium chloride Stopped (09/28/19 1359)  . linezolid (ZYVOX) IV 600 mg (09/29/19 0951)  . methocarbamol (ROBAXIN) IV    . piperacillin-tazobactam (ZOSYN)  IV 3.375 g (09/29/19 0534)   PRN Meds: acetaminophen, albuterol, bisacodyl, HYDROmorphone (DILAUDID) injection, levalbuterol, magnesium citrate, methocarbamol **OR** methocarbamol (ROBAXIN) IV, metoCLOPramide **OR** metoCLOPramide (REGLAN) injection, ondansetron **OR** ondansetron (ZOFRAN) IV, oxyCODONE, oxyCODONE, polyethylene glycol  Allergies:   No Known Allergies  Social History:   Social History   Socioeconomic History  . Marital status: Widowed    Spouse name: Not on file  . Number of children: Not on file  . Years of education: Not on file  . Highest education level: Not on file  Occupational History  . Not on file  Tobacco Use  . Smoking status: Current Every Day Smoker    Packs/day: 0.50    Types: Cigarettes  . Smokeless tobacco: Never Used  Substance and Sexual Activity  . Alcohol use: Yes    Alcohol/week: 6.0 standard drinks    Types: 6 Standard drinks or equivalent per week  . Drug use: No  . Sexual activity: Yes    Partners: Female    Comment: monagamous stable relationship  Other Topics Concern  . Not on file  Social History Narrative  . Not on file   Social Determinants of Health   Financial Resource Strain:   . Difficulty of Paying Living Expenses:   Food Insecurity:   . Worried About Charity fundraiser in the Last Year:   . Arboriculturist in the Last Year:   Transportation Needs:   . Film/video editor (Medical):   Marland Kitchen Lack of Transportation (Non-Medical):   Physical Activity:   . Days of Exercise per Week:   . Minutes of Exercise per Session:   Stress:   . Feeling of Stress :   Social Connections:   . Frequency  of Communication with Friends and Family:   . Frequency of Social Gatherings with Friends and Family:   . Attends Religious Services:   . Active Member of Clubs or Organizations:   . Attends Archivist Meetings:   Marland Kitchen Marital Status:   Intimate Partner Violence:   . Fear of Current or Ex-Partner:   . Emotionally Abused:   Marland Kitchen Physically Abused:   . Sexually Abused:     Family History:    Family History  Problem Relation Age of Onset  . Alcoholism Mother   . Alcoholism Father      ROS:  Please see the history of present illness.  General:no colds or fevers, no weight changes Skin:no rashes or ulcers HEENT:no blurred vision, no congestion CV:see HPI PUL:see HPI GI:no diarrhea constipation or melena, no indigestion GU:no hematuria, no dysuria MS:no joint  pain, no claudication, + foot infection Neuro:no syncope, no lightheadedness Endo:+ diabetes, no thyroid disease  All other ROS reviewed and negative.     Physical Exam/Data:   Vitals:   09/29/19 0200 09/29/19 0300 09/29/19 0404 09/29/19 0730  BP: 112/72 108/81 (!) 146/78 121/84  Pulse: (!) 118 (!) 117 (!) 118 (!) 120  Resp:  (!) 23  20  Temp:   98.3 F (36.8 C) 98.1 F (36.7 C)  TempSrc:   Oral Oral  SpO2: 98% 98% 94% 98%  Weight:      Height:        Intake/Output Summary (Last 24 hours) at 09/29/2019 1035 Last data filed at 09/29/2019 1019 Gross per 24 hour  Intake 2029.24 ml  Output 2725 ml  Net -695.76 ml   Last 3 Weights 09/28/2019 09/26/2019 09/26/2019  Weight (lbs) 267 lb 263 lb 7.2 oz 257 lb  Weight (kg) 121.11 kg 119.5 kg 116.574 kg     Body mass index is 36.21 kg/m.  General:  Well nourished, well developed, in no acute distress HEENT: normal Lymph: no adenopathy Neck: mild JVD at 20 degrees Endocrine:  No thryomegaly Vascular: No carotid bruits; Rt pedal 1+Lt post amputation  Cardiac:  normal S1, S2; RRR; no murmur gallup rub or click.  Lungs:  Rhonchi, few rales + wheezes to auscultation  bilaterally Abd: obese, soft, nontender, no hepatomegaly  Ext: no edema Musculoskeletal:  Lt lower leg amputation  Skin: warm and dry  Neuro: alert and oriented X 3 MAE, follows commands, no focal abnormalities noted Psych:  Normal affect   Relevant CV Studies: Echo 09/28/19 IMPRESSIONS    1. Assessment of his LV function is difficult. Consider repeat echo with  definity contrast once HR is slower . Marland Kitchen Left ventricular ejection  fraction, by estimation, is 35 to 40%. The left ventricle has moderately  decreased function. The left ventricle  demonstrates global hypokinesis. Left ventricular diastolic parameters are  indeterminate.  2. Right ventricular systolic function is mildly reduced. The right  ventricular size is normal.  3. Left atrial size was mildly dilated.  4. Right atrial size was mildly dilated.  5. The mitral valve is normal in structure. Mild mitral valve  regurgitation. No evidence of mitral stenosis.  6. The aortic valve is not well visualized in the short axis view but is  likely a 3 leaflet valve .Marland Kitchen The aortic valve is tricuspid. Aortic valve  regurgitation is not visualized. Moderate aortic valve stenosis.   FINDINGS  Left Ventricle: Assessment of his LV function is difficult. Consider  repeat echo with definity contrast once HR is slower. Left ventricular  ejection fraction, by estimation, is 35 to 40%. The left ventricle has  moderately decreased function. The left  ventricle demonstrates global hypokinesis. Definity contrast agent was  given IV to delineate the left ventricular endocardial borders. The left  ventricular internal cavity size was normal in size. There is borderline  left ventricular hypertrophy. Left  ventricular diastolic function could not be evaluated due to atrial  fibrillation. Left ventricular diastolic parameters are indeterminate.   Right Ventricle: The right ventricular size is normal. No increase in  right ventricular wall  thickness. Right ventricular systolic function is  mildly reduced.   Left Atrium: Left atrial size was mildly dilated.   Right Atrium: Right atrial size was mildly dilated.   Pericardium: There is no evidence of pericardial effusion.   Mitral Valve: The mitral valve is normal in structure. Mild mitral valve  regurgitation. No evidence of mitral valve stenosis.   Tricuspid Valve: The tricuspid valve is not well visualized. Tricuspid  valve regurgitation is not demonstrated.   Aortic Valve: The aortic valve is not well visualized in the short axis  view but is likely a 3 leaflet valve. The aortic valve is tricuspid. .  There is moderate thickening and moderate calcification of the aortic  valve. Aortic valve regurgitation is not  visualized. Moderate aortic stenosis is present. There is moderate  thickening of the aortic valve. There is moderate calcification of the  aortic valve. Aortic valve mean gradient measures 25.0 mmHg. Aortic valve  peak gradient measures 37.5 mmHg. Aortic  valve area, by VTI measures 1.29 cm.   Pulmonic Valve: The pulmonic valve was normal in structure. Pulmonic valve  regurgitation is not visualized. No evidence of pulmonic stenosis.   Aorta: The aortic root and ascending aorta are structurally normal, with  no evidence of dilitation.   IAS/Shunts: The atrial septum is grossly normal.     LEFT VENTRICLE  PLAX 2D  LVIDd:     5.30 cm Diastology  LVIDs:     4.50 cm LV e' lateral:  8.70 cm/s  LV PW:     1.10 cm LV E/e' lateral: 14.1  LV IVS:    1.30 cm LV e' medial:  5.98 cm/s  LVOT diam:   2.30 cm LV E/e' medial: 20.6  LV SV:     78  LV SV Index:  32  LVOT Area:   4.15 cm     RIGHT VENTRICLE  RV S prime:   9.25 cm/s  TAPSE (M-mode): 1.6 cm   LEFT ATRIUM       Index    RIGHT ATRIUM      Index  LA diam:    5.20 cm 2.16 cm/m RA Area:   22.60 cm  LA Vol (A2C):  85.0 ml 35.29 ml/m RA  Volume:  64.80 ml 26.90 ml/m  LA Vol (A4C):  81.9 ml 34.00 ml/m  LA Biplane Vol: 86.9 ml 36.07 ml/m  AORTIC VALVE  AV Area (Vmax):  1.29 cm  AV Area (Vmean):  1.17 cm  AV Area (VTI):   1.29 cm  AV Vmax:      306.00 cm/s  AV Vmean:     240.000 cm/s  AV VTI:      0.606 m  AV Peak Grad:   37.5 mmHg  AV Mean Grad:   25.0 mmHg  LVOT Vmax:     95.00 cm/s  LVOT Vmean:    67.700 cm/s  LVOT VTI:     0.188 m  LVOT/AV VTI ratio: 0.31    AORTA  Ao Root diam: 3.60 cm  Ao Asc diam: 3.50 cm   MITRAL VALVE  MV Area (PHT): 6.71 cm   SHUNTS  MV Decel Time: 113 msec   Systemic VTI: 0.19 m  MV E velocity: 123.00 cm/s Systemic Diam: 2.30 cm    Venous doppler 09/27/19 LEFT:  - There is no evidence of deep vein thrombosis in the lower extremity.  However, portions of this examination were limited- see technologist  comments above.    - No cystic structure found in the popliteal fossa.    In consideration of ABI order, and patient's significant lower extremity  pain prohibiting ABI from being completed, a brief evaluation of the left  PTA and DPA was completed and found to be patent with monophasic flow.     Laboratory Data:  High  Sensitivity Troponin:  No results for input(s): TROPONINIHS in the last 720 hours.   Chemistry Recent Labs  Lab 09/28/19 0727 09/28/19 1831 09/29/19 0352  NA 129* 128* 130*  K 4.8 4.9 5.1  CL 98 96* 98  CO2 21* 23 24  GLUCOSE 147* 217* 221*  BUN 59* 49* 47*  CREATININE 1.79* 1.67* 1.68*  CALCIUM 7.9* 7.8* 7.8*  GFRNONAA 38* 42* 41*  GFRAA 44* 48* 48*  ANIONGAP 10 9 8     Recent Labs  Lab 09/26/19 1449  PROT 6.9  ALBUMIN 2.2*  AST 53*  ALT 71*  ALKPHOS 198*  BILITOT 0.8   Hematology Recent Labs  Lab 09/28/19 0251 09/28/19 1831 09/29/19 0352  WBC 12.1* 8.8 10.2  RBC 2.94* 2.75* 2.86*  HGB 8.6* 8.1* 8.4*  HCT 27.3* 25.4* 26.6*  MCV 92.9 92.4 93.0  MCH 29.3 29.5 29.4  MCHC 31.5  31.9 31.6  RDW 13.2 13.3 13.2  PLT 381 384 408*   BNP Recent Labs  Lab 09/27/19 1329  BNP 195.1*    DDimer  Recent Labs  Lab 09/26/19 2135  DDIMER 3.25*     Radiology/Studies:  DG Chest 2 View  Result Date: 09/26/2019 CLINICAL DATA:  Fever EXAM: CHEST - 2 VIEW COMPARISON:  Radiograph 01/18/2009 FINDINGS: Stable cardiac silhouette. Prominent epicardial fat pad. Lungs are hyperinflated. No effusion, infiltrate, or pneumothorax. IMPRESSION: Hyperinflated lungs.  No acute findings Electronically Signed   By: Suzy Bouchard M.D.   On: 09/26/2019 15:16   CT ANGIO CHEST PE W OR WO CONTRAST  Result Date: 09/28/2019 CLINICAL DATA:  Shortness of breath EXAM: CT ANGIOGRAPHY CHEST WITH CONTRAST TECHNIQUE: Multidetector CT imaging of the chest was performed using the standard protocol during bolus administration of intravenous contrast. Multiplanar CT image reconstructions and MIPs were obtained to evaluate the vascular anatomy. CONTRAST:  59mL OMNIPAQUE IOHEXOL 350 MG/ML SOLN COMPARISON:  Chest radiograph September 26, 2019; lung scan September 27, 2019 FINDINGS: Cardiovascular: There is no demonstrable pulmonary embolus. There is no thoracic aortic aneurysm or dissection. There are scattered foci of calcification in the visualized great vessels. There is aortic atherosclerosis. There are scattered foci of coronary artery calcification. There is no pericardial effusion or pericardial thickening. There is calcification in the mitral annulus. Mediastinum/Nodes: Visualized thyroid appears normal. There are scattered subcentimeter mediastinal lymph nodes. There is adenopathy in the subcarinal region. The confluence of adenopathy in the subcarinal region measures 3.4 x 2.3 cm. No other adenopathy by size criteria. No esophageal lesions evident. Lungs/Pleura: There are fairly small pleural effusions bilaterally with patchy airspace opacity in both lung bases, slightly more on the right than on the left. There is an area  of apparent consolidation in a portion of the inferior lingula. There is also a focal area of airspace opacity in the posterior aspect of the apical segment of the right upper lobe. More patchy infiltrate is noted in the periphery of the medial segment of the right middle lobe. There is calcification along the left hemidiaphragm. Upper Abdomen: There is reflux of contrast into the inferior vena cava and hepatic veins. There is upper abdominal aortic atherosclerosis. Visualized upper abdominal structures otherwise appear normal. Musculoskeletal: There are no blastic or lytic bone lesions. No chest wall lesions are evident. Review of the MIP images confirms the above findings. IMPRESSION: 1. No demonstrable pulmonary embolus. No thoracic aortic aneurysm or dissection. There is aortic atherosclerosis as well as foci of coronary artery and great vessel arterial vascular calcification. 2. Pleural  effusions bilaterally, slightly larger on the right than on the left. Multifocal areas of airspace opacity consistent with multifocal pneumonia. Pneumonia in most notable in the lower lobes but also present at several other sites. 3. Subcarinal adenopathy of uncertain etiology. This adenopathy may well be secondary to the parenchymal lung changes. Neoplastic etiology cannot be entirely excluded, however. Multiple subcentimeter thoracic lymph nodes noted elsewhere. 4. Reflux of contrast into the inferior vena cava and hepatic veins, a finding indicative of increased right heart pressure. 5. Calcification along the left hemidiaphragm. Question residua of previous infection in this area. Asbestos exposure could present in this manner, although there are no other changes elsewhere to suggest asbestos exposure. Aortic Atherosclerosis (ICD10-I70.0). Electronically Signed   By: Lowella Grip III M.D.   On: 09/28/2019 10:16   NM Pulmonary Perf and Vent  Addendum Date: 09/27/2019   ADDENDUM REPORT: 09/27/2019 15:05 ADDENDUM:  Findings conveyed toCresenzoon 09/27/2019 at15:03. In discussion with MD, plan to proceed with CTPA with renal precautions and reduced IV contrast dose. Electronically Signed   By: Suzy Bouchard M.D.   On: 09/27/2019 15:05   Result Date: 09/27/2019 CLINICAL DATA:  Concern for pulmonary embolism.  Tachycardia. EXAM: NUCLEAR MEDICINE VENTILATION - PERFUSION LUNG SCAN TECHNIQUE: Ventilation images were obtained in multiple projections using inhaled aerosol Tc-46m DTPA. Perfusion images were obtained in multiple projections after intravenous injection of Tc-18m MAA. RADIOPHARMACEUTICALS:  Forty mCi of Tc-54m DTPA aerosol inhalation and 1.6 mCi Tc32m MAA IV COMPARISON:  Chest radiograph 1 day prior FINDINGS: Ventilation: Marked decreased ventilation to the LEFT lung with most significant decrease within the LEFT lower lobe. Perfusion: No wedge-shaped peripheral perfusion defect. Matched decreased perfusion to the LEFT lower lobe matches the ventilation defect. IMPRESSION: Large regional decreased ventilation and perfusion to the LEFT lower lobe. Findings could indicate in mucous plugging and atelectasis. There is some increased density in the lingula on comparison radiograph but no clear atelectasis. Central obstructing lesion is possible however again no atelectasis identified on radiograph. Consider CT of the chest further evaluation. Large LEFT lobe pulmonary embolism is not completely excluded (intermediate probability). These results will be called to the ordering clinician or representative by the Radiologist Assistant, and communication documented in the PACS or [employer]. Electronically Signed: By: Suzy Bouchard M.D. On: 09/27/2019 14:21   DG Foot 2 Views Left  Result Date: 09/26/2019 CLINICAL DATA:  69 year old male with foot infection and necrotic toe. EXAM: LEFT FOOT - 2 VIEW COMPARISON:  Left foot radiograph dated 09/21/2019. FINDINGS: There is fracture of the base of the proximal phalanx of  the fifth digit which may represent a pathologic fracture secondary to osteomyelitis. Clinical correlation is recommended. No other acute fracture identified. There is no dislocation. The bones are osteopenic. There is diffuse subcutaneous soft tissue edema and soft tissue swelling of the forefoot with large pockets of soft tissue air along the dorsum of the forefoot most concerning for necrotizing fasciitis. IMPRESSION: 1. Fracture of the base of the proximal phalanx of the fifth digit. 2. 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 Electronically Signed   By: Anner Crete M.D.   On: 09/26/2019 16:01   ECHOCARDIOGRAM COMPLETE  Result Date: 09/28/2019    ECHOCARDIOGRAM REPORT   Patient Name:   Rodney [name] Date of Exam: 09/28/2019 Medical Rec #:  [id number]         Height:       72.0 in Accession #:    [id number]  Weight:       267.0 lb Date of Birth:  October 05, 1950        BSA:          2.409 m Patient Age:    48 years          BP:           114/76 mmHg Patient Gender: M                 HR:           120 bpm. Exam Location:  Inpatient Procedure: 2D Echo, Color Doppler, Cardiac Doppler and Intracardiac            Opacification Agent Indications:    R06.9 DOE  History:        Patient has no prior history of Echocardiogram examinations.                 Risk Factors:Hypertension, Diabetes and Dyslipidemia.  Sonographer:    Raquel Sarna Senior RDCS Referring Phys: Benjamin  Sonographer Comments: Technically difficult study due to poor echo windows. IMPRESSIONS  1. Assessment of his LV function is difficult. Consider repeat echo with definity contrast once HR is slower . Marland Kitchen Left ventricular ejection fraction, by estimation, is 35 to 40%. The left ventricle has moderately decreased function. The left ventricle demonstrates global hypokinesis. Left ventricular diastolic parameters are indeterminate.  2. Right ventricular systolic function is mildly reduced. The right  ventricular size is normal.  3. Left atrial size was mildly dilated.  4. Right atrial size was mildly dilated.  5. The mitral valve is normal in structure. Mild mitral valve regurgitation. No evidence of mitral stenosis.  6. The aortic valve is not well visualized in the short axis view but is likely a 3 leaflet valve .Marland Kitchen The aortic valve is tricuspid. Aortic valve regurgitation is not visualized. Moderate aortic valve stenosis. FINDINGS  Left Ventricle: Assessment of his LV function is difficult. Consider repeat echo with definity contrast once HR is slower. Left ventricular ejection fraction, by estimation, is 35 to 40%. The left ventricle has moderately decreased function. The left ventricle demonstrates global hypokinesis. Definity contrast agent was given IV to delineate the left ventricular endocardial borders. The left ventricular internal cavity size was normal in size. There is borderline left ventricular hypertrophy. Left ventricular diastolic function could not be evaluated due to atrial fibrillation. Left ventricular diastolic parameters are indeterminate. Right Ventricle: The right ventricular size is normal. No increase in right ventricular wall thickness. Right ventricular systolic function is mildly reduced. Left Atrium: Left atrial size was mildly dilated. Right Atrium: Right atrial size was mildly dilated. Pericardium: There is no evidence of pericardial effusion. Mitral Valve: The mitral valve is normal in structure. Mild mitral valve regurgitation. No evidence of mitral valve stenosis. Tricuspid Valve: The tricuspid valve is not well visualized. Tricuspid valve regurgitation is not demonstrated. Aortic Valve: The aortic valve is not well visualized in the short axis view but is likely a 3 leaflet valve. The aortic valve is tricuspid. . There is moderate thickening and moderate calcification of the aortic valve. Aortic valve regurgitation is not visualized. Moderate aortic stenosis is present.  There is moderate thickening of the aortic valve. There is moderate calcification of the aortic valve. Aortic valve mean gradient measures 25.0 mmHg. Aortic valve peak gradient measures 37.5 mmHg. Aortic valve area, by VTI measures 1.29 cm. Pulmonic Valve: The pulmonic valve was normal in structure. Pulmonic valve regurgitation is not  visualized. No evidence of pulmonic stenosis. Aorta: The aortic root and ascending aorta are structurally normal, with no evidence of dilitation. IAS/Shunts: The atrial septum is grossly normal.  LEFT VENTRICLE PLAX 2D LVIDd:         5.30 cm  Diastology LVIDs:         4.50 cm  LV e' lateral:   8.70 cm/s LV PW:         1.10 cm  LV E/e' lateral: 14.1 LV IVS:        1.30 cm  LV e' medial:    5.98 cm/s LVOT diam:     2.30 cm  LV E/e' medial:  20.6 LV SV:         78 LV SV Index:   32 LVOT Area:     4.15 cm  RIGHT VENTRICLE RV S prime:     9.25 cm/s TAPSE (M-mode): 1.6 cm LEFT ATRIUM             Index       RIGHT ATRIUM           Index LA diam:        5.20 cm 2.16 cm/m  RA Area:     22.60 cm LA Vol (A2C):   85.0 ml 35.29 ml/m RA Volume:   64.80 ml  26.90 ml/m LA Vol (A4C):   81.9 ml 34.00 ml/m LA Biplane Vol: 86.9 ml 36.07 ml/m  AORTIC VALVE AV Area (Vmax):    1.29 cm AV Area (Vmean):   1.17 cm AV Area (VTI):     1.29 cm AV Vmax:           306.00 cm/s AV Vmean:          240.000 cm/s AV VTI:            0.606 m AV Peak Grad:      37.5 mmHg AV Mean Grad:      25.0 mmHg LVOT Vmax:         95.00 cm/s LVOT Vmean:        67.700 cm/s LVOT VTI:          0.188 m LVOT/AV VTI ratio: 0.31  AORTA Ao Root diam: 3.60 cm Ao Asc diam:  3.50 cm MITRAL VALVE MV Area (PHT): 6.71 cm     SHUNTS MV Decel Time: 113 msec     Systemic VTI:  0.19 m MV E velocity: 123.00 cm/s  Systemic Diam: 2.30 cm Mertie Moores MD Electronically signed by Mertie Moores MD Signature Date/Time: 09/28/2019/4:14:20 PM    Final    VAS [place] LOWER EXTREMITY VENOUS (DVT)  Result Date: 09/27/2019  Lower Venous DVTStudy Indications:  Edema, and Pain. Gangrene left foot.  Limitations: Body habitus, poor ultrasound/tissue interface and significant lower extremity pain. Comparison Study: No prior study Performing Technologist: Maudry Mayhew MHA, RDMS, RVT, RDCS  Examination Guidelines: A complete evaluation includes B-mode imaging, spectral Doppler, color Doppler, and power Doppler as needed of all accessible portions of each vessel. Bilateral testing is considered an integral part of a complete examination. Limited examinations for reoccurring indications may be performed as noted. The reflux portion of the exam is performed with the patient in reverse Trendelenburg.  +---------+---------------+---------+-----------+----------+--------------+ LEFT     CompressibilityPhasicitySpontaneityPropertiesThrombus Aging +---------+---------------+---------+-----------+----------+--------------+ CFV      Full           Yes      Yes                                 +---------+---------------+---------+-----------+----------+--------------+  SFJ      Full                                                        +---------+---------------+---------+-----------+----------+--------------+ FV Prox  Full                                                        +---------+---------------+---------+-----------+----------+--------------+ FV Mid                           Yes                                 +---------+---------------+---------+-----------+----------+--------------+ FV Distal                        Yes                                 +---------+---------------+---------+-----------+----------+--------------+ POP      Full           Yes      Yes                                 +---------+---------------+---------+-----------+----------+--------------+ PTV                              Yes                                 +---------+---------------+---------+-----------+----------+--------------+ PERO                              Yes                                 +---------+---------------+---------+-----------+----------+--------------+     Summary: LEFT: - There is no evidence of deep vein thrombosis in the lower extremity. However, portions of this examination were limited- see technologist comments above.  - No cystic structure found in the popliteal fossa.  In consideration of ABI order, and patient's significant lower extremity pain prohibiting ABI from being completed, a brief evaluation of the left PTA and DPA was completed and found to be patent with monophasic flow.  *See table(s) above for measurements and observations. Electronically signed by Curt Jews MD on 09/27/2019 at 5:21:41 PM.    Final           Assessment and Plan:   Acute CHF  has rec'd 40 mg IV lasix twice and + 2681 wt is also up from 119.5 Kg to 121.1 Kg--neg PE on chest CT, + Pl effusions  On CT of chest Reflux of contrast into the inferior vena cava and hepatic veins, a finding indicative of increased right heart pressure.  BNP 195  on admit --may need higher dose of lasix currently on lasix 40 daily IV --once diuresed add entresto if possible other wise back on lisinopril he was on at home and spironolactone and kidney disease allows.    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  Once more stable would do Rt and Lt cardiac cath to eval CAD and RHF --continue ASA, entresto vs lisinopril, BB and spironolactone.   Tachycardia --may be compensatory for infection/sepsis/ -pt unaware of tachycardia and on OV 09/21/19 HR was 89  --TSH is 1.215  --once diuresed add coreg 3.125 BID may need dig for now.   Acute cellulitis/osteomyelitis and ambulation per Ortho and FP team on abx   CKD-3a monitor   DM IDDM per FP team HgbA1C is 9.2   PNA per CTA of chest Subcarinal adenopathy of uncertain etiology  HLD on lipitor and LDL here 48 with HDL 20   Aortic atherosclerosis as well as foci  of coronary artery and great vessel arterial vascular calcification.  Anemia with HGB at 8.4 post op was 10.1 on admit.   Tobacco use was smoking 2 ppd until 2 weeks ago and stopped.  Congratulated.      For questions or updates, please contact [facility] Please consult [email or web address] for contact info under    Signed, Cecilie Kicks, NP  09/29/2019 10:35 AM

## 2019-09-29 NOTE — Evaluation (Addendum)
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 Also with newly diagnosed systolic heart failure, LVEF 35-40% and at least moderate aortic stenosis. PMH includes HTN, HLD, DM.  Clinical Impression  Patient presents with pain, generalized weakness, impaired balance and impaired mobility s/p above. Pt reports being independent for ADLs most of the time with occasional help needed and independent with ambulation. Today, pt reluctant to perform mobility limiting assessment. Requires Mod A for bed mobility. Able to laterally scoot along side bed with Min A but declined transfer to chair or standing. "it has only been 2 days."  Noted to be tachycardic throughout session in 120s bpm. Pt self limiting with mobility. Of note, pt's life long partner (g/f) and support at home is supposed to be getting her knee replaced in 2-3 weeks Education provided re: positioning, desensitization techniques, there ex, and phantom limb pain. Discussed need for ramp to be built to enter home. Pt aware and trying to find someone to do it. Would benefit from post acute rehab to maximize independence and mobility prior to return home. Will follow acutely.     Follow Up Recommendations CIR;Supervision for mobility/OOB    Equipment Recommendations  Other (comment)(TBA pending disposition likely drop arm BSC, w/c)    Recommendations for Other Services       Precautions / Restrictions Precautions Precautions: Fall Precaution Comments: wound vac, watch HR Required Braces or Orthoses: Other Brace Other Brace: limb guard Restrictions Weight Bearing Restrictions: Yes LLE Weight Bearing: Non weight bearing      Mobility  Bed Mobility Overal bed mobility: Needs Assistance Bed Mobility: Supine to  Sit;Sit to Supine     Supine to sit: Mod assist;HOB elevated Sit to supine: Supervision;HOB elevated   General bed mobility comments: Pt reaching out for therapist's hand to get to EOB. Able to bring self into bed without assist.  Transfers Overall transfer level: Needs assistance   Transfers: Lateral/Scoot Transfers          Lateral/Scoot Transfers: Min assist General transfer comment: Able to laterally scoot along side bed x3 with Min A using pad. Declines further mobility.  Ambulation/Gait                Stairs            Wheelchair Mobility    Modified Rankin (Stroke Patients Only)       Balance Overall balance assessment: Needs assistance Sitting-balance support: Single extremity supported(foot supported) Sitting balance-Leahy Scale: Fair Sitting balance - Comments: supervision for safety.       Standing balance comment: Declined standing                             Pertinent Vitals/Pain Pain Assessment: 0-10 Pain Score: 6  Pain Location: left residual limb Pain Descriptors / Indicators: Sore;Operative site guarding Pain Intervention(s): Repositioned;Monitored during session;Limited activity within patient's tolerance    Home Living Family/patient expects to be discharged to:: Private residence Living Arrangements: Spouse/significant other([name]) Available Help at Discharge: Family Type of Home: House Home Access: Stairs to enter Entrance Stairs-Rails: None Entrance Stairs-Number of Steps: 3 Home Layout: One level Home Equipment: Walker - 2 wheels      Prior Function Level of Independence: Independent  Comments: Needs help at times with ADLs otherwise independent, drives.     Hand Dominance        Extremity/Trunk Assessment   Upper Extremity Assessment Upper Extremity Assessment: Defer to OT evaluation    Lower Extremity Assessment Lower Extremity Assessment: LLE deficits/detail LLE Deficits /  Details: reports sensation WFL; limited assessment due to cooperation. Able to perform quad set and knee flexion. Limited AROM due to wound vac LLE Sensation: WNL       Communication   Communication: HOH  Cognition Arousal/Alertness: Awake/alert Behavior During Therapy: WFL for tasks assessed/performed Overall Cognitive Status: No family/caregiver present to determine baseline cognitive functioning                                 General Comments: Difficulty understanding need/reason for PT despite stating he is not safe to return home and explanation; "I don't feel like doing exercise, it has only been 2 days."      General Comments General comments (skin integrity, edema, etc.): Pt on supplemental 02, VSS during session.    Exercises     Assessment/Plan    PT Assessment Patient needs continued PT services  PT Problem List Decreased strength;Decreased mobility;Pain;Decreased balance;Decreased knowledge of use of DME;Decreased range of motion;Decreased knowledge of precautions;Obesity;Cardiopulmonary status limiting activity;Decreased skin integrity;Decreased activity tolerance       PT Treatment Interventions Patient/family education;Therapeutic activities;DME instruction;Gait training;Therapeutic exercise;Wheelchair mobility training;Balance training;Functional mobility training    PT Goals (Current goals can be found in the Care Plan section)  Acute Rehab PT Goals Patient Stated Goal: to get back to independence PT Goal Formulation: With patient Time For Goal Achievement: 10/13/19 Potential to Achieve Goals: Fair    Frequency Min 3X/week   Barriers to discharge Inaccessible home environment;Decreased caregiver support stairs to enter home; partner is supposed to be getting her knee replaced in 2-3 weeks    Co-evaluation               AM-PAC PT "6 Clicks" Mobility  Outcome Measure Help needed turning from your back to your side while in a flat bed  without using bedrails?: A Little Help needed moving from lying on your back to sitting on the side of a flat bed without using bedrails?: A Lot Help needed moving to and from a bed to a chair (including a wheelchair)?: A Little Help needed standing up from a chair using your arms (e.g., wheelchair or bedside chair)?: A Lot Help needed to walk in hospital room?: A Lot Help needed climbing 3-5 steps with a railing? : Total 6 Click Score: 13    End of Session Equipment Utilized During Treatment: Oxygen Activity Tolerance: Patient limited by pain;Other (comment)(unwillingness) Patient left: in bed;with call bell/phone within reach;with bed alarm set;with nursing/sitter in room Nurse Communication: Mobility status PT Visit Diagnosis: Pain;Muscle weakness (generalized) (M62.81);Unsteadiness on feet (R26.81) Pain - Right/Left: Left Pain - part of body: Leg    Time: 0922-0950 PT Time Calculation (min) (ACUTE ONLY): 28 min   Charges:   PT Evaluation $PT Eval Moderate Complexity: 1 Mod PT Treatments $Therapeutic Activity: 8-22 mins        Marisa Severin, PT, DPT Acute Rehabilitation Services Pager [phone] Office [place] 09/29/2019, 12:39 PM

## 2019-09-30 ENCOUNTER — Encounter (HOSPITAL_COMMUNITY): Payer: Medicare PPO

## 2019-09-30 DIAGNOSIS — L03032 Cellulitis of left toe: Secondary | ICD-10-CM

## 2019-09-30 LAB — BASIC METABOLIC PANEL
Anion gap: 9 (ref 5–15)
Anion gap: 8 (ref 5–15)
BUN: 35 mg/dL — ABNORMAL HIGH (ref 8–23)
BUN: 27 mg/dL — ABNORMAL HIGH (ref 8–23)
CO2: 25 mmol/L (ref 22–32)
CO2: 27 mmol/L (ref 22–32)
Calcium: 7.9 mg/dL — ABNORMAL LOW (ref 8.9–10.3)
Calcium: 8 mg/dL — ABNORMAL LOW (ref 8.9–10.3)
Chloride: 98 mmol/L (ref 98–111)
Chloride: 99 mmol/L (ref 98–111)
Creatinine, Ser: 1.32 mg/dL — ABNORMAL HIGH (ref 0.61–1.24)
Creatinine, Ser: 1.17 mg/dL (ref 0.61–1.24)
GFR calc Af Amer: >60 mL/min (ref >60)
GFR calc Af Amer: >60 mL/min (ref >60)
GFR calc non Af Amer: 55 mL/min — ABNORMAL LOW (ref >60)
GFR calc non Af Amer: >60 mL/min (ref >60)
Glucose, Bld: 215 mg/dL — ABNORMAL HIGH (ref 70–99)
Glucose, Bld: 208 mg/dL — ABNORMAL HIGH (ref 70–99)
Potassium: 4.5 mmol/L (ref 3.5–5.1)
Potassium: 4.6 mmol/L (ref 3.5–5.1)
Sodium: 132 mmol/L — ABNORMAL LOW (ref 135–145)
Sodium: 134 mmol/L — ABNORMAL LOW (ref 135–145)

## 2019-09-30 LAB — FOLATE: Folate: 13.9 ng/mL (ref >5.9)

## 2019-09-30 LAB — RETICULOCYTES
Immature Retic Fract: 26.5 % — ABNORMAL HIGH (ref 2.3–15.9)
RBC.: 2.95 MIL/uL — ABNORMAL LOW (ref 4.22–5.81)
Retic Count, Absolute: 45.1 10*3/uL (ref 19.0–186.0)
Retic Ct Pct: 1.5 % (ref 0.4–3.1)

## 2019-09-30 LAB — CBC
HCT: 28.9 % — ABNORMAL LOW (ref 39.0–52.0)
HCT: 27.1 % — ABNORMAL LOW (ref 39.0–52.0)
Hemoglobin: 9.1 g/dL — ABNORMAL LOW (ref 13.0–17.0)
Hemoglobin: 8.7 g/dL — ABNORMAL LOW (ref 13.0–17.0)
MCH: 29.1 pg (ref 26.0–34.0)
MCH: 29.5 pg (ref 26.0–34.0)
MCHC: 31.5 g/dL (ref 30.0–36.0)
MCHC: 32.1 g/dL (ref 30.0–36.0)
MCV: 92.3 fL (ref 80.0–100.0)
MCV: 91.9 fL (ref 80.0–100.0)
Platelets: 433 10*3/uL — ABNORMAL HIGH (ref 150–400)
Platelets: 442 10*3/uL — ABNORMAL HIGH (ref 150–400)
RBC: 3.13 MIL/uL — ABNORMAL LOW (ref 4.22–5.81)
RBC: 2.95 MIL/uL — ABNORMAL LOW (ref 4.22–5.81)
RDW: 13 % (ref 11.5–15.5)
RDW: 13 % (ref 11.5–15.5)
WBC: 8.3 10*3/uL (ref 4.0–10.5)
WBC: 8.7 10*3/uL (ref 4.0–10.5)
nRBC: 0 % (ref 0.0–0.2)
nRBC: 0 % (ref 0.0–0.2)

## 2019-09-30 LAB — GLUCOSE, CAPILLARY
Glucose-Capillary: 238 mg/dL — ABNORMAL HIGH (ref 70–99)
Glucose-Capillary: 208 mg/dL — ABNORMAL HIGH (ref 70–99)
Glucose-Capillary: 198 mg/dL — ABNORMAL HIGH (ref 70–99)
Glucose-Capillary: 191 mg/dL — ABNORMAL HIGH (ref 70–99)
Glucose-Capillary: 163 mg/dL — ABNORMAL HIGH (ref 70–99)

## 2019-09-30 LAB — IRON AND TIBC
Iron: 23 ug/dL — ABNORMAL LOW (ref 45–182)
Saturation Ratios: 9 % — ABNORMAL LOW (ref 17.9–39.5)
TIBC: 256 ug/dL (ref 250–450)
UIBC: 233 ug/dL

## 2019-09-30 LAB — VITAMIN B12: Vitamin B-12: 928 pg/mL — ABNORMAL HIGH (ref 180–914)

## 2019-09-30 LAB — FERRITIN: Ferritin: 168 ng/mL (ref 24–336)

## 2019-09-30 MED ORDER — LOSARTAN POTASSIUM 25 MG PO TABS
25.0000 mg | ORAL_TABLET | Freq: Every day | ORAL | Status: DC
  Administered 2019-09-30 – 2019-10-09 (×10): 25 mg via ORAL
  Filled 2019-09-30 (×10): qty 1

## 2019-09-30 MED ORDER — SODIUM CHLORIDE 0.9 % IV SOLN: 1.0000 g | INTRAVENOUS | Status: DC

## 2019-09-30 MED ORDER — SODIUM CHLORIDE 0.9 % IV SOLN
1.0000 g | INTRAVENOUS | Status: DC
  Administered 2019-10-01: 1 g via INTRAVENOUS
  Filled 2019-09-30: qty 1

## 2019-09-30 MED ORDER — CARVEDILOL 6.25 MG PO TABS
6.2500 mg | ORAL_TABLET | Freq: Two times a day (BID) | ORAL | Status: DC
  Administered 2019-09-30 – 2019-10-02 (×3): 6.25 mg via ORAL
  Filled 2019-09-30 (×3): qty 1

## 2019-09-30 MED ORDER — INSULIN GLARGINE 100 UNIT/ML ~~LOC~~ SOLN
30.0000 [IU] | Freq: Every day | SUBCUTANEOUS | Status: DC
  Administered 2019-09-30 – 2019-10-01 (×2): 30 [IU] via SUBCUTANEOUS
  Filled 2019-09-30 (×3): qty 0.3

## 2019-09-30 MED ORDER — LINEZOLID 600 MG PO TABS
600.0000 mg | ORAL_TABLET | Freq: Two times a day (BID) | ORAL | Status: AC
  Administered 2019-09-30 – 2019-10-01 (×3): 600 mg via ORAL
  Filled 2019-09-30 (×3): qty 1

## 2019-09-30 NOTE — Progress Notes (Signed)
PT Cancellation Note  Patient Details Name: Rodney [name] MRN: [id number] DOB: 1950/11/10   Cancelled Treatment:    Reason Eval/Treat Not Completed: Other (comment).  Has been receiving bad news about a family member and declined therapy, but has been OOB with OT earlier.   Ramond Dial 09/30/2019, 1:29 PM   Mee Hives, PT MS Acute Rehab Dept. Number: [place] and [place]

## 2019-09-30 NOTE — Evaluation (Signed)
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 Also with newly diagnosed systolic heart failure, LVEF 35-40% and at least moderate aortic stenosis. PMH includes HTN, HLD, DM.   Clinical Impression   Pt admitted with the above. Pt currently with functional limitations due to the deficits listed below (see OT Problem List).  Pt will benefit from skilled OT to increase their safety and independence with ADL and functional mobility for ADL to facilitate discharge to venue listed below.   Pt very pleasant with OT this day and will be a great rehab candidate!     Follow Up Recommendations  CIR    Equipment Recommendations  None recommended by OT    Recommendations for Other Services       Precautions / Restrictions Precautions Precautions: Fall Precaution Comments: wound vac, watch HR Other Brace: limb guard Restrictions Weight Bearing Restrictions: Yes LLE Weight Bearing: Non weight bearing      Mobility Bed Mobility Overal bed mobility: Needs Assistance Bed Mobility: Supine to Sit;Sit to Supine     Supine to sit: Min assist     General bed mobility comments: Pt reaching out for therapist's hand to get to EOB. Able to bring self into bed without assist.  Transfers Overall transfer level: Needs assistance   Transfers: Lateral/Scoot Transfers                Balance Overall balance assessment: Needs assistance Sitting-balance support: Single extremity supported(foot supported) Sitting balance-Leahy Scale: Fair Sitting balance - Comments: supervision for safety.       Standing balance comment: Declined standing                           ADL either performed or assessed with clinical judgement   ADL Overall ADL's :  Needs assistance/impaired Eating/Feeding: Set up;Sitting   Grooming: Set up;Sitting   Upper Body Bathing: Set up;Sitting   Lower Body Bathing: Maximal assistance;Sit to/from stand;Sitting/lateral leans;Cueing for safety;Cueing for compensatory techniques   Upper Body Dressing : Set up;Sitting   Lower Body Dressing: Sitting/lateral leans;Maximal assistance;Cueing for safety;Cueing for compensatory techniques   Toilet Transfer: Cueing for safety;Cueing for sequencing;Maximal assistance Toilet Transfer Details (indicate cue type and reason): bed to chair- laterally scooted Toileting- Clothing Manipulation and Hygiene: Sitting/lateral lean;Maximal assistance         General ADL Comments: pt with great participation.  Pt wants to get well and is nervous about the future. Pt will do GREAT on rehab     Vision Patient Visual Report: No change from baseline              Pertinent Vitals/Pain Pain Score: 5  Pain Location: left residual limb Pain Descriptors / Indicators: Sore;Operative site guarding Pain Intervention(s): Limited activity within patient's tolerance     Hand Dominance     Extremity/Trunk Assessment Upper Extremity Assessment Upper Extremity Assessment: Overall WFL for tasks assessed           Communication Communication Communication: HOH   Cognition Arousal/Alertness: Awake/alert Behavior During Therapy: WFL for tasks assessed/performed Overall Cognitive Status: Within Functional Limits for tasks assessed  Home Living Family/patient expects to be discharged to:: Private residence Living Arrangements: Spouse/significant other([name]) Available Help at Discharge: Family Type of Home: House Home Access: Stairs to enter Technical brewer of Steps: 3 Entrance Stairs-Rails: None Home Layout: One level     Bathroom Shower/Tub: Tub/shower unit;Curtain   Biochemist, clinical: [place]: Environmental consultant - 2 wheels          Prior Functioning/Environment Level of Independence: Independent        Comments: Needs help at times with ADLs otherwise independent, drives.        OT Problem List: Decreased strength;Decreased activity tolerance;Impaired balance (sitting and/or standing);Decreased knowledge of precautions;Decreased safety awareness;Obesity;Pain;Decreased knowledge of use of DME or AE      OT Treatment/Interventions: Self-care/ADL training;Patient/family education;DME and/or AE instruction;Therapeutic exercise    OT Goals(Current goals can be found in the care plan section) Acute Rehab OT Goals Patient Stated Goal: to get back to independence OT Goal Formulation: With patient Time For Goal Achievement: 10/07/19 Potential to Achieve Goals: Good  OT Frequency: Min 2X/week   Barriers to D/C:               AM-PAC OT "6 Clicks" Daily Activity     Outcome Measure Help from another person eating meals?: None Help from another person taking care of personal grooming?: None Help from another person toileting, which includes using toliet, bedpan, or urinal?: A Lot Help from another person bathing (including washing, rinsing, drying)?: A Lot Help from another person to put on and taking off regular upper body clothing?: A Little Help from another person to put on and taking off regular lower body clothing?: A Lot 6 Click Score: 17   End of Session Nurse Communication: Mobility status  Activity Tolerance: Patient tolerated treatment well Patient left: in chair;with call bell/phone within reach  OT Visit Diagnosis: Unsteadiness on feet (R26.81);Muscle weakness (generalized) (M62.81);Pain;Other abnormalities of gait and mobility (R26.89) Pain - Right/Left: Left Pain - part of body: Leg                Time: 1572-6203 OT Time Calculation (min): 29 min Charges:  OT General Charges $OT Visit: 1 Visit OT Evaluation $OT Eval Moderate Complexity: 1  Mod OT Treatments $Self Care/Home Management : 8-22 mins Kari Baars, OT Acute Rehabilitation Services [phone] Office- [phone]     [name], Edwena Felty D 09/30/2019, 11:26 AM

## 2019-09-30 NOTE — Progress Notes (Signed)
PT Cancellation Note  Patient Details Name: Rodney [name] MRN: [id number] DOB: 05/05/1950   Cancelled Treatment:    Reason Eval/Treat Not Completed: Fatigue/lethargy limiting ability to participate.  Just completed OT and will retry at another time.   Ramond Dial 09/30/2019, 10:24 AM   Mee Hives, PT MS Acute Rehab Dept. Number: [place] and [place]

## 2019-09-30 NOTE — Progress Notes (Signed)
Patient has remained with a yellow mews score all throughout the shift. This isn't anything new. He has had a yellow mews score for several shifts. He doesn't display signs of distress per his assessment. Continued to check vital signs every 4 hours.

## 2019-09-30 NOTE — Progress Notes (Signed)
Family Medicine Teaching Service Daily Progress Note Intern Pager: [phone]  Patient name: Rodney [name] Medical record number: [id number] Date of birth: 09-Sep-1950 Age: 69 y.o. Gender: male  Primary Care Provider: [name], MD Consultants: Orthopedics Code Status: Full code  Pt Overview and Major Events to Date:   Assessment and Plan: Rodney [name] is a 69 y.o. male presenting with left foot cellulitis/ulceration. PMH is significant for hypertension, hyperlipidemia, tobacco abuse, type 2 diabetes, dyspnea, peripheral edema.  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 Remains afebrile, vitals stabilizing. -Orthopedics consulted, appreciate recommendations -Continue Zosyn and Linezolid through 6/6, then continue ceftriaxone for pneumonia x2 days -PT/OT eval and treat - recommend CIR versus SNF for d/c -Follow-up on blood cultures-no growth at 4 days, however may have had an adequate volume for growth -Vitals per routine -Morning CBCs  HFrEF, stable Patient had echo 6/3 showing LVEF 35-40%. RV systolic function is mildly reduced, both atria mildly dilated.  Losartan initiated per cardiology recommendation.  Will ultimately need left heart cath. -Cardiology consulted, appreciate recommendations -Continue 40 mg IV Lasix BID, reassess as fluid status changes -Continue aspirin -Low-dose Coreg 6.25 mg twice daily (dose increased 6/5 due to continued bradycardia) -Continue smoking cessation counseling -Strict I's and O's  Tachycardia with dyspnea, slowly improving Tachycardia 120>107 bpm this a.m., EKG with occasional PVCs.  CTA negative for PE.  Tachycardia could also be due to alcohol withdrawal, inflammatory reaction, pain, anxiety. -Continue to monitor tachycardia and work of breathing -Coreg 6.25 mg twice daily -CIWA's 0 overnight  Acute Hypoxic respiratory failure secondary to  pneumonia and new onset heart failure with reduced EF (acute).  - Abx as above, minimum of 5 days, then ceftriaxone for 2 days starting 6/7. - Diuresis as below - Appreciate Cardiology input and care-- LE arterial studies ordered  T2DM with neuropathy  PVD, stable Hemoglobin A1c 09/21/2019 was 8.7.  CBG this a.m. 208, stable.  Medications include Novolin 70/30 75 units in the morning and 55 units in the evening, Metformin 1000 mg twice daily -Hold patient's home insulin, Metformin -Lantus 30 units qHS -Moderate SSI -CBG monitoring -Vascular Arterial Duplex of LE ordered to evaluate PVD  Hyponatremia, 132 6/5.  Most likely due to hypervolemia, improving with diuresis.  -Monitor with diuresis.   Anemia, secondary to acute blood loss from surgery and underlying chronic disease. Normocytic -Anemia panel ordered, follow-up results  AKI, improving Cr 2.08 on admission, BL ~1.1, today 1.32.  -Morning BMPs -KVO IVF -Avoid nephrotoxic agents -Holding lisinopril-HCTZ combo -Giving Lasix 40 mg IV twice daily -Initiating losartan 25 mg daily  History of alcohol use disorder Patient declined alcohol use on admission; however, patient's PCP reported otherwise.  CIWA is overnight 0, no Ativan on board. -Monitor CIWA's -Call for Ativan if CIWA's >8.  Peripheral edema, resolved Medications include furosemide 40 mg daily -Giving Lasix 40 mg IV given new HFrEF -Strict I's and O's  Hyperlipidemia, chronic, stable Home medication includes atorvastatin 40 mg daily. Patient's 10-year ASCVD risk 41.1%. -Continue home atorvastatin 40 mg qHS  Hypertension, well controlled Home medication includes Lisinopril-HCTZ 20-25 mg daily.  Blood pressures stable. -Holding lisinopril-HCTZ at this time in the setting of hypotension and AKI -Continue Lasix  History of tobacco abuse Patient reportedly recently quit smoking -Recommend continued cessation  FEN/GI: Carb modified diet PPx:  Lovenox  Disposition: Discharged to CIR vs. SNF pending medical improvement (possibly Monday, 6/7)  Subjective:  Patient seen sitting upright in his recliner this morning, pleasant patient.  Objective: Temp:  [98 F (36.7 C)-98.3 F (36.8 C)] 98.1 F (36.7 C) (06/05 0447) Pulse Rate:  [107-123] 123 (06/05 0447) Resp:  [16-24] 23 (06/05 0447) BP: (99-131)/(69-86) 131/86 (06/05 0447) SpO2:  [97 %-100 %] 100 % (06/05 0758) Weight:  [119.4 kg] 119.4 kg (06/05 0500) Physical Exam: General: No apparent distress, nontoxic-appearing Cardiovascular: Tachycardic to 107, occasional PVCs, no murmurs appreciated Respiratory: Comfortable work of breathing on 3 L nasal cannula, lower lobe crackles and wheeze appreciated bilaterally Abdomen: Soft, nontender, positive bowel sounds Extremities: Right lower extremity with 1+ pitting edema. Patient is s/p left BKA, bandages intact with wound VAC in place, minimal to no drainage  Laboratory: Recent Labs  Lab 09/28/19 1831 09/29/19 0352 09/30/19 0325  WBC 8.8 10.2 8.3  HGB 8.1* 8.4* 9.1*  HCT 25.4* 26.6* 28.9*  PLT 384 408* 433*   Recent Labs  Lab 09/26/19 1449 09/27/19 0301 09/29/19 0352 09/29/19 1629 09/30/19 0325  NA 129*   < > 130* 129* 132*  K 4.6   < > 5.1 4.4 4.5  CL 90*   < > 98 95* 98  CO2 26   < > 24 23 25   BUN 64*   < > 47* 39* 35*  CREATININE 2.08*   < > 1.68* 1.57* 1.32*  CALCIUM 9.4   < > 7.8* 7.7* 7.9*  PROT 6.9  --   --   --   --   BILITOT 0.8  --   --   --   --   ALKPHOS 198*  --   --   --   --   ALT 71*  --   --   --   --   AST 53*  --   --   --   --   GLUCOSE 194*   < > 221* 176* 215*   < > = values in this interval not displayed.   Imaging/Diagnostic Tests: No results found.   Daisy Floro, DO 09/30/2019, 9:00 AM PGY-2, [place] Intern pager: [phone], text pages welcome

## 2019-09-30 NOTE — Progress Notes (Signed)
Inpatient Rehab Admissions:  Inpatient Rehab Consult received.  I met with patient at the bedside for rehabilitation assessment and to discuss goals and expectations of an inpatient rehab admission.  Pt acknowledged understanding of CIR goals and expectations. Pt appears to be an appropriate candidate for CIR, however need to clarify if pt has an appropriate dispo plan.  Given permission to call partner, [name].  Left message and awaiting return call.  Will continue to follow pt's progress with acute therapies and medical workup.  Signed: Gayland Curry, [place], [place] Admissions Coordinator [phone]

## 2019-09-30 NOTE — Progress Notes (Addendum)
Progress Note  Patient Name: ARLANDO LEISINGER Date of Encounter: 09/30/2019  Primary Cardiologist: No primary care provider on file.   Subjective   Dyspnea is improved. Denies chest pain.  Inpatient Medications    Scheduled Meds: . acetaminophen  650 mg Oral Q6H  . aspirin EC  81 mg Oral Daily  . atorvastatin  40 mg Oral QHS  . carvedilol  6.25 mg Oral BID WC  . docusate sodium  100 mg Oral BID  . enoxaparin (LOVENOX) injection  40 mg Subcutaneous Q24H  . furosemide  40 mg Intravenous Daily  . insulin aspart  0-15 Units Subcutaneous TID WC  . insulin aspart  0-5 Units Subcutaneous QHS  . insulin glargine  30 Units Subcutaneous QHS  . ipratropium-albuterol  3 mL Nebulization QID  . linezolid  600 mg Oral Q12H   Continuous Infusions: . sodium chloride Stopped (09/28/19 1359)  . piperacillin-tazobactam (ZOSYN)  IV 3.375 g (09/30/19 1234)   PRN Meds: bisacodyl, magnesium citrate, metoCLOPramide **OR** metoCLOPramide (REGLAN) injection, ondansetron **OR** ondansetron (ZOFRAN) IV, oxyCODONE, oxyCODONE, polyethylene glycol   Vital Signs    Vitals:   09/30/19 0758 09/30/19 0912 09/30/19 1121 09/30/19 1213  BP:  132/89  (!) 127/58  Pulse:  (!) 124  (!) 108  Resp:  (!) 22  (!) 22  Temp:  98.4 F (36.9 C)  98 F (36.7 C)  TempSrc:  Oral  Oral  SpO2: 100% 100% 99% 100%  Weight:      Height:        Intake/Output Summary (Last 24 hours) at 09/30/2019 1320 Last data filed at 09/30/2019 0300 Gross per 24 hour  Intake 613.8 ml  Output 1250 ml  Net -636.2 ml   Filed Weights   09/26/19 2356 09/28/19 1127 09/30/19 0500  Weight: 119.5 kg 121.1 kg 119.4 kg    Telemetry    nsr - Personally Reviewed  ECG    none - Personally Reviewed  Physical Exam   GEN: diskempt appearing, No acute distress.   Neck: 6 cm JVD Cardiac: RRR, no murmurs, rubs, or gallops.  Respiratory: Clear to auscultation bilaterally with minimal basilar rales, right more than left GI: Soft, obese,  nontender, non-distended  MS: No edema; No deformity, left bka Neuro:  Nonfocal  Psych: Normal affect   Labs    Chemistry Recent Labs  Lab 09/26/19 1449 09/27/19 0301 09/29/19 0352 09/29/19 1629 09/30/19 0325  NA 129*   < > 130* 129* 132*  K 4.6   < > 5.1 4.4 4.5  CL 90*   < > 98 95* 98  CO2 26   < > 24 23 25   GLUCOSE 194*   < > 221* 176* 215*  BUN 64*   < > 47* 39* 35*  CREATININE 2.08*   < > 1.68* 1.57* 1.32*  CALCIUM 9.4   < > 7.8* 7.7* 7.9*  PROT 6.9  --   --   --   --   ALBUMIN 2.2*  --   --   --   --   AST 53*  --   --   --   --   ALT 71*  --   --   --   --   ALKPHOS 198*  --   --   --   --   BILITOT 0.8  --   --   --   --   GFRNONAA 32*   < > 41* 45* 55*  GFRAA 37*   < >  48* 52* >60  ANIONGAP 13   < > 8 11 9    < > = values in this interval not displayed.     Hematology Recent Labs  Lab 09/28/19 1831 09/29/19 0352 09/30/19 0325  WBC 8.8 10.2 8.3  RBC 2.75* 2.86* 3.13*  HGB 8.1* 8.4* 9.1*  HCT 25.4* 26.6* 28.9*  MCV 92.4 93.0 92.3  MCH 29.5 29.4 29.1  MCHC 31.9 31.6 31.5  RDW 13.3 13.2 13.0  PLT 384 408* 433*    Cardiac EnzymesNo results for input(s): TROPONINI in the last 168 hours. No results for input(s): TROPIPOC in the last 168 hours.   BNP Recent Labs  Lab 09/27/19 1329  BNP 195.1*     DDimer  Recent Labs  Lab 09/26/19 2135  DDIMER 3.25*     Radiology    ECHOCARDIOGRAM COMPLETE  Result Date: 09/28/2019    ECHOCARDIOGRAM REPORT   Patient Name:   FENIX RORKE Date of Exam: 09/28/2019 Medical Rec #:  [id number]         Height:       72.0 in Accession #:    [id number]        Weight:       267.0 lb Date of Birth:  December 08, 1950        BSA:          2.409 m Patient Age:    52 years          BP:           114/76 mmHg Patient Gender: M                 HR:           120 bpm. Exam Location:  Inpatient Procedure: 2D Echo, Color Doppler, Cardiac Doppler and Intracardiac            Opacification Agent Indications:    R06.9 DOE  History:         Patient has no prior history of Echocardiogram examinations.                 Risk Factors:Hypertension, Diabetes and Dyslipidemia.  Sonographer:    Raquel Sarna Senior RDCS Referring Phys: [place]  Sonographer Comments: Technically difficult study due to poor echo windows. IMPRESSIONS  1. Assessment of his LV function is difficult. Consider repeat echo with definity contrast once HR is slower . Marland Kitchen Left ventricular ejection fraction, by estimation, is 35 to 40%. The left ventricle has moderately decreased function. The left ventricle demonstrates global hypokinesis. Left ventricular diastolic parameters are indeterminate.  2. Right ventricular systolic function is mildly reduced. The right ventricular size is normal.  3. Left atrial size was mildly dilated.  4. Right atrial size was mildly dilated.  5. The mitral valve is normal in structure. Mild mitral valve regurgitation. No evidence of mitral stenosis.  6. The aortic valve is not well visualized in the short axis view but is likely a 3 leaflet valve .Marland Kitchen The aortic valve is tricuspid. Aortic valve regurgitation is not visualized. Moderate aortic valve stenosis. FINDINGS  Left Ventricle: Assessment of his LV function is difficult. Consider repeat echo with definity contrast once HR is slower. Left ventricular ejection fraction, by estimation, is 35 to 40%. The left ventricle has moderately decreased function. The left ventricle demonstrates global hypokinesis. Definity contrast agent was given IV to delineate the left ventricular endocardial borders. The left ventricular internal cavity size was normal in size. There is borderline left  ventricular hypertrophy. Left ventricular diastolic function could not be evaluated due to atrial fibrillation. Left ventricular diastolic parameters are indeterminate. Right Ventricle: The right ventricular size is normal. No increase in right ventricular wall thickness. Right ventricular systolic function is mildly reduced. Left  Atrium: Left atrial size was mildly dilated. Right Atrium: Right atrial size was mildly dilated. Pericardium: There is no evidence of pericardial effusion. Mitral Valve: The mitral valve is normal in structure. Mild mitral valve regurgitation. No evidence of mitral valve stenosis. Tricuspid Valve: The tricuspid valve is not well visualized. Tricuspid valve regurgitation is not demonstrated. Aortic Valve: The aortic valve is not well visualized in the short axis view but is likely a 3 leaflet valve. The aortic valve is tricuspid. . There is moderate thickening and moderate calcification of the aortic valve. Aortic valve regurgitation is not visualized. Moderate aortic stenosis is present. There is moderate thickening of the aortic valve. There is moderate calcification of the aortic valve. Aortic valve mean gradient measures 25.0 mmHg. Aortic valve peak gradient measures 37.5 mmHg. Aortic valve area, by VTI measures 1.29 cm. Pulmonic Valve: The pulmonic valve was normal in structure. Pulmonic valve regurgitation is not visualized. No evidence of pulmonic stenosis. Aorta: The aortic root and ascending aorta are structurally normal, with no evidence of dilitation. IAS/Shunts: The atrial septum is grossly normal.  LEFT VENTRICLE PLAX 2D LVIDd:         5.30 cm  Diastology LVIDs:         4.50 cm  LV e' lateral:   8.70 cm/s LV PW:         1.10 cm  LV E/e' lateral: 14.1 LV IVS:        1.30 cm  LV e' medial:    5.98 cm/s LVOT diam:     2.30 cm  LV E/e' medial:  20.6 LV SV:         78 LV SV Index:   32 LVOT Area:     4.15 cm  RIGHT VENTRICLE RV S prime:     9.25 cm/s TAPSE (M-mode): 1.6 cm LEFT ATRIUM             Index       RIGHT ATRIUM           Index LA diam:        5.20 cm 2.16 cm/m  RA Area:     22.60 cm LA Vol (A2C):   85.0 ml 35.29 ml/m RA Volume:   64.80 ml  26.90 ml/m LA Vol (A4C):   81.9 ml 34.00 ml/m LA Biplane Vol: 86.9 ml 36.07 ml/m  AORTIC VALVE AV Area (Vmax):    1.29 cm AV Area (Vmean):   1.17 cm AV  Area (VTI):     1.29 cm AV Vmax:           306.00 cm/s AV Vmean:          240.000 cm/s AV VTI:            0.606 m AV Peak Grad:      37.5 mmHg AV Mean Grad:      25.0 mmHg LVOT Vmax:         95.00 cm/s LVOT Vmean:        67.700 cm/s LVOT VTI:          0.188 m LVOT/AV VTI ratio: 0.31  AORTA Ao Root diam: 3.60 cm Ao Asc diam:  3.50 cm MITRAL VALVE MV Area (PHT): 6.71 cm  SHUNTS MV Decel Time: 113 msec     Systemic VTI:  0.19 m MV E velocity: 123.00 cm/s  Systemic Diam: 2.30 cm Mertie Moores MD Electronically signed by Mertie Moores MD Signature Date/Time: 09/28/2019/4:14:20 PM    Final     Cardiac Studies   2D echo with moderate LV dysfunction  Patient Profile     69 y.o. male admitted with osteo, s/p left BKA. Post op developed worsening sob and increase HR and diagnosed with acute systolic heart failure  Assessment & Plan    1. Acute systolic heart failure - he is improved. Dyspnea is much better. Continue diuretic, beta blocker and we will start an ARB for now. 2. Peripheral vascular disease - he is s/p left bka.      For questions or updates, please contact [place] Please consult [email or web address] for contact info under Cardiology/STEMI.      Signed, Cristopher Peru, MD  09/30/2019, 1:20 PM  Patient ID: [name], male   DOB: [birth year], 69 y.o.   MRN: [id number]

## 2019-09-30 NOTE — Discharge Summary (Signed)
[facility] Discharge Summary  Patient name: Rodney [name] Medical record number: [id number] Date of birth: [birth year] Age: 69 y.o. 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 (HCC)   Severe protein-calorie malnutrition (HCC)   PVD (peripheral vascular disease) ([place])   Necrotizing fasciitis ([place])   Anemia in chronic illness   HFrEF (heart failure with reduced ejection fraction) (HCC)   Moderate aortic stenosis   Atrial fibrillation (HCC)   Atypical atrial flutter (HCC)  Disposition: SNF  Discharge Condition: Stable  Discharge Exam:  General: Alert and oriented in no apparent distress Heart: Regular rate and rhythm with no murmurs appreciated Lungs: CTA bilaterally, no wheezing Abdomen: Bowel sounds present, no abdominal pain Skin: Warm and dry Extremities: Left BKA, no right edema  Brief Hospital Course:  Rodney [name] is a 69 y.o. male who presented for cellulitis of the left foot that was not responsive to outpatient management.  Past medical history significant for hypertension, hyperlipidemia, tobacco abuse, type 2 diabetes, dyspnea, peripheral edema.  Below is his hospital course by problem.   Left foot cellulitis/sepsis/osteomyelitis/necrotizing fasciitis Patient was diagnosed with cellulitis approximately week prior to admission.  He was prescribed Bactrim but over the week his cellulitis worsened so he presented to the  emergency department.  On evaluation there was concern for osteomyelitis as well as sepsis the patient was started on Zosyn and vancomycin as well as clindamycin.  Surgery was consulted and he was scheduled for BKA.  Out of concern for shortness of breath and possible pulmonary embolism the surgery was held until he could have a CT to rule out pulmonary embolism.  Antibiotics were transitioned to Zosyn and linezolid, the vancomycin and clindamycin were discontinued.  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  Antibiotics were continued for an additional 3 days postop per surgery's recommendations.  Newly diagnosed HFrEF On admission patient was found to be tachycardic, tachypneic with lower extremity edema.  He was given fluids given meeting SIRS criteria but his tachycardia and tachypnea showed no improvement.  He was diuresed due to new lung crackles and showed mild improvement.  There was concern for heart failure so an echo was ordered.  Echo showed LVEF of 35 to 40% with LV decreased function.  RV systolic function was mildly reduced.  Left and right atrium mildly dilated.  Cardiology was consulted and recommended continuation Lasix diuresis as well as starting low-dose Coreg 3.125 mg twice daily.  They recommended consideration of ARB or Entresto prior to discharge depending renal function.  Right heart cath completed on 6/8 showing complete occlusion of proximal RCA with mild stenosis of left sided circulation.  They recommended medical management.  Patient was started on losartan 25 mg daily, dapsone 12.5 mg daily and metoprolol tartrate.  Prior to discharge she was transitioned from metoprolol tartrate to Coreg.  He was also started on Lasix 40 mg daily.  At discharge patient was continued on furosemide which was reduced to 20 mg daily, spironolactone 12.5 mg  daily, losartan 25 mg daily, carvedilol 12.5 mg twice daily and empagliflozin 10 mg daily.  A Fib  Patient with new onset a  fib documented on 10/01/2019. Cariology already following and recommends Caroline vs warfarin.  Patient was started on Eliquis 5 mg twice daily and rate control with metoprolol 25 mg every 6 hours.  He was scheduled for a TEE/DCCV on 6/10.  TEE/DCCV was completed on 6/10 and patient was in normal sinus rhythm.  Cardiology recommended switching from metoprolol to Coreg which was done.  At discharge patient was continued on Coreg as well as started on amiodarone for 1 month.  He will take 400 mg twice daily for 1 week ending 10/09/19, and then 200 mg daily for 3 weeks.   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  He was on Novolin 70/30 75 units in the morning and 55 units in the evening at home as well as Metformin 1000 mg twice daily.  While admitted he was given Lantus 20 units daily which was increased to 25 units daily as well as a moderate sensitivity sliding scale.  His sugars were still difficult to control so his Lantus was increased to 30 units.  He remained at 30 units except for days of procedures where his Lantus was decreased to 20 units due to his n.p.o. status.  Given new diagnosis of heart failure and CAD patient was started on empagliflozin. Patient was switched to lantus on discharge as a new home medication.  AKI Creatinine on admission was 2.08 with his baseline being 1-1 0.1.  Patient was give 3 L of lactated Ringer's in the ED with no improvement on next creatinine check.  Home Lasix and lisinopril-hydrochlorothiazide combo were held due to the AKI but given no improvement after fluids and concern for cardiorenal response patient was started on Lasix and showed improvement of creatinine to 1.68.  Creatinine continued to trend down and was 1.48 on the day of discharge after starting jardiance. Because of this his lasix was cut to 20mg /day oral at time of discharge.    Issues for Follow Up:  1. PCP consider starting on asthma  controller medication such as Umeclidinium 2. Lasix reduced from 40mg  daily to 20mg  daily due to bump in creatinine after starting jardiance. Follow up with BMP in one week  3. Discharged on lantus 30u at bedtime, eliquis 5mg  bid for atrial fib, amiodarone 400mg  until 6/14 followed by 200mg x 21.   Significant Procedures: Left BKA L heart cath. TEE with cardioversion  Significant Labs and Imaging:  Recent Labs  Lab 10/03/19 0333 10/04/19 0308 10/09/19 0458  WBC 10.0 10.3 9.8  HGB 10.3* 10.3* 10.7*  HCT 32.3* 32.0* 33.4*  PLT 514* 513* 320   Recent Labs  Lab 10/03/19 0333 10/03/19 0333 10/04/19 0308 10/04/19 0308 10/05/19 0325 10/05/19 0325 10/06/19 0331 10/09/19 0458  NA 138  --  136  --  133*  --  135 134*  K 4.2   < > 4.1   < > 4.0   < > 4.2 4.1  CL 100  --  98  --  97*  --  97* 97*  CO2 29  --  29  --  29  --  30 28  GLUCOSE 148*  --  163*  --  219*  --  219* 140*  BUN 17  --  21  --  25*  --  23 28*  CREATININE 0.97  --  1.01  --  0.99  --  1.10 1.48*  CALCIUM 9.1  --  9.0  --  8.8*  --  9.0 9.4   < > = values in this interval not displayed.     Results/Tests Pending at Time of Discharge:   Discharge Medications:  Allergies as of 10/09/2019   No Known Allergies     Medication List    STOP taking these medications   lisinopril-hydrochlorothiazide 20-25 MG tablet Commonly known as: ZESTORETIC   NovoLIN 70/30 ReliOn (70-30) 100 UNIT/ML injection Generic drug: insulin NPH-regular Human   sulfamethoxazole-trimethoprim 800-160 MG tablet Commonly known as: BACTRIM DS     TAKE these medications   amiodarone 400 MG tablet Commonly known as: PACERONE Take 1 tablet (400 mg total) by mouth 2 (two) times daily for 4 days.   amiodarone 200 MG tablet Commonly known as: PACERONE Take 1 tablet (200 mg total) by mouth daily for 21 days. Start taking on: October 13, 2019   apixaban 5 MG Tabs tablet Commonly known as: ELIQUIS Take 1 tablet (5 mg total) by mouth 2  (two) times daily.   aspirin 81 MG tablet Take 81 mg by mouth daily.   atorvastatin 80 MG tablet Commonly known as: LIPITOR Take 1 tablet (80 mg total) by mouth daily. What changed:   medication strength  how much to take  when to take this   carvedilol 12.5 MG tablet Commonly known as: COREG Take 1 tablet (12.5 mg total) by mouth 2 (two) times daily with a meal.   empagliflozin 10 MG Tabs tablet Commonly known as: JARDIANCE Take 1 tablet (10 mg total) by mouth daily.   furosemide 20 MG tablet Commonly known as: LASIX Take 1 tablet (20 mg total) by mouth daily. Start taking on: October 10, 2019 What changed:   medication strength  how much to take   glucose blood test strip Commonly known as: Accu-Chek Aviva Use as instructed What changed:   how much to take  how to take this  when to take this  additional instructions   insulin glargine 100 UNIT/ML injection Commonly known as: LANTUS Inject 0.3 mLs (30 Units total) into the skin at bedtime.   losartan 25 MG tablet Commonly known as: COZAAR Take 1 tablet (25 mg total) by mouth daily.   metFORMIN 1000 MG tablet Commonly known as: GLUCOPHAGE TAKE 1 TABLET (1,000 MG TOTAL) BY MOUTH 2 (TWO) TIMES DAILY WITH A MEAL.   spironolactone 25 MG tablet Commonly known as: ALDACTONE Take 0.5 tablets (12.5 mg total) by mouth daily.       Discharge Instructions: Please refer to Patient Instructions section of EMR for full details.  Patient was counseled important signs and symptoms that should prompt return to medical care, changes in medications, dietary instructions, activity restrictions, and follow up appointments.   Follow-Up Appointments:  Follow-up Information    Newt Minion, MD In 1 week.   Specialty: Orthopedic Surgery Contact information: [place] [address] [phone]               Lurline Del, DO 10/09/2019, 12:34 PM PGY-1, [place]

## 2019-10-01 DIAGNOSIS — E1151 Type 2 diabetes mellitus with diabetic peripheral angiopathy without gangrene: Secondary | ICD-10-CM

## 2019-10-01 DIAGNOSIS — E785 Hyperlipidemia, unspecified: Secondary | ICD-10-CM

## 2019-10-01 DIAGNOSIS — I4819 Other persistent atrial fibrillation: Secondary | ICD-10-CM

## 2019-10-01 LAB — CBC
HCT: 28.5 % — ABNORMAL LOW (ref 39.0–52.0)
Hemoglobin: 9.1 g/dL — ABNORMAL LOW (ref 13.0–17.0)
MCH: 29.4 pg (ref 26.0–34.0)
MCHC: 31.9 g/dL (ref 30.0–36.0)
MCV: 92.2 fL (ref 80.0–100.0)
Platelets: 498 10*3/uL — ABNORMAL HIGH (ref 150–400)
RBC: 3.09 MIL/uL — ABNORMAL LOW (ref 4.22–5.81)
RDW: 13.2 % (ref 11.5–15.5)
WBC: 11 10*3/uL — ABNORMAL HIGH (ref 4.0–10.5)
nRBC: 0 % (ref 0.0–0.2)

## 2019-10-01 LAB — BASIC METABOLIC PANEL
Anion gap: 7 (ref 5–15)
BUN: 23 mg/dL (ref 8–23)
CO2: 25 mmol/L (ref 22–32)
Calcium: 8.1 mg/dL — ABNORMAL LOW (ref 8.9–10.3)
Chloride: 101 mmol/L (ref 98–111)
Creatinine, Ser: 1.13 mg/dL (ref 0.61–1.24)
GFR calc Af Amer: >60 mL/min (ref >60)
GFR calc non Af Amer: >60 mL/min (ref >60)
Glucose, Bld: 147 mg/dL — ABNORMAL HIGH (ref 70–99)
Potassium: 4.5 mmol/L (ref 3.5–5.1)
Sodium: 133 mmol/L — ABNORMAL LOW (ref 135–145)

## 2019-10-01 LAB — GLUCOSE, CAPILLARY
Glucose-Capillary: 134 mg/dL — ABNORMAL HIGH (ref 70–99)
Glucose-Capillary: 135 mg/dL — ABNORMAL HIGH (ref 70–99)
Glucose-Capillary: 147 mg/dL — ABNORMAL HIGH (ref 70–99)
Glucose-Capillary: 199 mg/dL — ABNORMAL HIGH (ref 70–99)
Glucose-Capillary: 221 mg/dL — ABNORMAL HIGH (ref 70–99)

## 2019-10-01 LAB — CULTURE, BLOOD (ROUTINE X 2)
Culture: NO GROWTH
Culture: NO GROWTH
Special Requests: ADEQUATE

## 2019-10-01 MED ORDER — CEFDINIR 300 MG PO CAPS: 300.0000 mg | ORAL_CAPSULE | Freq: Two times a day (BID) | ORAL | Status: DC

## 2019-10-01 MED ORDER — CEFDINIR 300 MG PO CAPS
300.0000 mg | ORAL_CAPSULE | Freq: Two times a day (BID) | ORAL | Status: AC
  Administered 2019-10-02 – 2019-10-03 (×4): 300 mg via ORAL
  Filled 2019-10-01 (×4): qty 1

## 2019-10-01 MED ORDER — SODIUM CHLORIDE 0.9 % IV SOLN
510.0000 mg | Freq: Once | INTRAVENOUS | Status: AC
  Administered 2019-10-01: 510 mg via INTRAVENOUS
  Filled 2019-10-01: qty 17

## 2019-10-01 MED ORDER — IPRATROPIUM-ALBUTEROL 0.5-2.5 (3) MG/3ML IN SOLN
3.0000 mL | Freq: Three times a day (TID) | RESPIRATORY_TRACT | Status: DC
  Administered 2019-10-01 – 2019-10-02 (×3): 3 mL via RESPIRATORY_TRACT
  Filled 2019-10-01 (×3): qty 3

## 2019-10-01 NOTE — Progress Notes (Signed)
Inpatient Rehab Admissions Coordinator:  Spoke with McBride Nation, pt's partner.  Explained CIR goals and expectations to her.  She acknowledged understanding of goals and expectations and is interested in CIR for pt.  She indicated that she has postponed her surgery so she will be present to help care of pt upon d/c home.  Will continue to follow pt.  Gayland Curry, [place], [place] Admissions Coordinator [phone]

## 2019-10-01 NOTE — Progress Notes (Signed)
Progress Note  Patient Name: Rodney [name] Date of Encounter: 10/01/2019  Primary Cardiologist: No primary care provider on file.   Subjective   No chest pain. Dyspnea is improved. He does not feel palitations.   Inpatient Medications    Scheduled Meds: . acetaminophen  650 mg Oral Q6H  . aspirin EC  81 mg Oral Daily  . atorvastatin  40 mg Oral QHS  . carvedilol  6.25 mg Oral BID WC  . docusate sodium  100 mg Oral BID  . enoxaparin (LOVENOX) injection  40 mg Subcutaneous Q24H  . furosemide  40 mg Intravenous Daily  . insulin aspart  0-15 Units Subcutaneous TID WC  . insulin aspart  0-5 Units Subcutaneous QHS  . insulin glargine  30 Units Subcutaneous QHS  . ipratropium-albuterol  3 mL Nebulization QID  . linezolid  600 mg Oral Q12H  . losartan  25 mg Oral Daily   Continuous Infusions: . sodium chloride Stopped (09/28/19 1359)  . cefTRIAXone (ROCEPHIN)  IV    . piperacillin-tazobactam (ZOSYN)  IV 3.375 g (10/01/19 0548)   PRN Meds: bisacodyl, magnesium citrate, metoCLOPramide **OR** metoCLOPramide (REGLAN) injection, ondansetron **OR** ondansetron (ZOFRAN) IV, oxyCODONE, oxyCODONE, polyethylene glycol   Vital Signs    Vitals:   10/01/19 0746 10/01/19 0900 10/01/19 0915 10/01/19 0934  BP:   93/65   Pulse:  (!) 120 86 (!) 116  Resp:  (!) 26 17   Temp:   98.1 F (36.7 C)   TempSrc:   Oral   SpO2: 98% 96% 95%   Weight:      Height:        Intake/Output Summary (Last 24 hours) at 10/01/2019 1044 Last data filed at 10/01/2019 0358 Gross per 24 hour  Intake 480 ml  Output 1250 ml  Net -770 ml   Filed Weights   09/28/19 1127 09/30/19 0500 10/01/19 0636  Weight: 121.1 kg 119.4 kg 118.2 kg    Telemetry    Probable atrial fib with a RVR - Personally Reviewed  ECG    none - Personally Reviewed  Physical Exam   GEN: No acute distress.   Neck: No JVD Cardiac: IRRR, no murmurs, rubs, or gallops.  Respiratory: Clear to auscultation bilaterally. GI: Soft,  nontender, non-distended  MS: No edema; No deformity. Neuro:  Nonfocal  Psych: Normal affect   Labs    Chemistry Recent Labs  Lab 09/26/19 1449 09/27/19 0301 09/30/19 0325 09/30/19 1646 10/01/19 0430  NA 129*   < > 132* 134* 133*  K 4.6   < > 4.5 4.6 4.5  CL 90*   < > 98 99 101  CO2 26   < > 25 27 25   GLUCOSE 194*   < > 215* 208* 147*  BUN 64*   < > 35* 27* 23  CREATININE 2.08*   < > 1.32* 1.17 1.13  CALCIUM 9.4   < > 7.9* 8.0* 8.1*  PROT 6.9  --   --   --   --   ALBUMIN 2.2*  --   --   --   --   AST 53*  --   --   --   --   ALT 71*  --   --   --   --   ALKPHOS 198*  --   --   --   --   BILITOT 0.8  --   --   --   --   GFRNONAA 32*   < >  55* >60 >60  GFRAA 37*   < > >60 >60 >60  ANIONGAP 13   < > 9 8 7    < > = values in this interval not displayed.     Hematology Recent Labs  Lab 09/30/19 0325 09/30/19 1646 10/01/19 0430  WBC 8.3 8.7 11.0*  RBC 3.13* 2.95*  2.95* 3.09*  HGB 9.1* 8.7* 9.1*  HCT 28.9* 27.1* 28.5*  MCV 92.3 91.9 92.2  MCH 29.1 29.5 29.4  MCHC 31.5 32.1 31.9  RDW 13.0 13.0 13.2  PLT 433* 442* 498*    Cardiac EnzymesNo results for input(s): TROPONINI in the last 168 hours. No results for input(s): TROPIPOC in the last 168 hours.   BNP Recent Labs  Lab 09/27/19 1329  BNP 195.1*     DDimer  Recent Labs  Lab 09/26/19 2135  DDIMER 3.25*     Radiology    No results found.  Cardiac Studies   none  Patient Profile     69 y.o. male admitted with infection, s/p left BKA.   Assessment & Plan    1. Probable atrial fib - I will order a 12 lead ecg. If he is in fact in atrial fib, he will need an [place] or warfarin as CHADS VASC is over 4. 2. Peripheral vascular disease - he is s/p left bka and denies leg pain.  3. Tobacco abuse - encouraged smoking cessation 4. Acute on chronic systolic/diastolic heart failure - his weight is down and dyspnea is improved. Unclear what euvolemic weight is. His volume status is difficult to assess.     For questions or updates, please contact [place] Please consult [email or web address] for contact info under Cardiology/STEMI.   Signed, Cristopher Peru, MD  10/01/2019, 10:44 AM  Patient ID: Rodney [name], male   DOB: [birth year], 69 y.o.   MRN: [id number]

## 2019-10-01 NOTE — Progress Notes (Addendum)
Family Medicine Teaching Service Daily Progress Note Intern Pager: [phone]  Patient name: Rodney [name] Medical record number: [id number] Date of birth: 31-Mar-1951 Age: 69 y.o. Gender: male  Primary Care Provider: [name], MD Consultants: Orthopedics Code Status: Full code  Pt Overview and Major Events to Date:   Assessment and Plan: [name] is a 69 y.o. male presenting with left foot cellulitis/ulceration. PMH is significant for hypertension, hyperlipidemia, tobacco abuse, type 2 diabetes, dyspnea, peripheral edema.  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  WBCs today 11.0 from 8.7 yesterday but improved from admission which was 17.  Remains afebrile, vitals stabilizing. -Orthopedics consulted, appreciate recommendations -Today is the last dose of Zosyn and linezolid -Continue ceftriaxone today -We will transition to cefdinir 300 mg twice daily tomorrow for 2 additional days -PT/OT eval and treat - recommend CIR versus SNF for d/c -CIR consulted and recommend admission for inpatient rehabilitation pending clear disposition plan -Follow-up on blood cultures-no growth at 4 days, however may have had an adequate volume for growth -Vitals per routine -Morning CBCs -Prescribed ramelteon to help with sleep  Acute heart failure in the setting of new diagnosis of HFrEF, stable Patient had echo 6/3 showing LVEF 35-40%. RV systolic function is mildly reduced, both atria mildly dilated.  Will ultimately need left heart cath. -Cardiology consulted, appreciate recommendations -Continue 40 mg IV Lasix BID, reassess as fluid status changes -Continue aspirin -Low-dose Coreg 6.25 mg twice daily (dose increased 6/5 due to continued bradycardia) -Started on Cozaar 25 mg daily -Continue smoking cessation counseling -Strict I's and O's  Tachycardia with dyspnea, slowly improving Tachycardia 120>107 bpm this  a.m., EKG with occasional PVCs.  CTA negative for PE.  Tachycardia could also be due to alcohol withdrawal, inflammatory reaction, pain, anxiety. -Continue to monitor tachycardia and work of breathing -Coreg 6.25 mg twice daily -CIWA's 0 overnight  Acute Hypoxic respiratory failure secondary to pneumonia and new onset heart failure with reduced EF (acute).  - Abx as above, minimum of 5 days, then ceftriaxone for 2 days starting 6/7. - Diuresis as below - Appreciate Cardiology input and care-- LE arterial studies ordered  T2DM with neuropathy  PVD, stable Hemoglobin A1c 09/21/2019 was 8.7.  CBG this a.m. 208, stable.  Medications include Novolin 70/30 75 units in the morning and 55 units in the evening, Metformin 1000 mg twice daily -Hold patient's home insulin, Metformin -Lantus 30 units qHS -Moderate SSI -CBG monitoring -Vascular Arterial Duplex of LE ordered to evaluate PVD  Hyponatremia, 132 6/5.  Most likely due to hypervolemia, improving with diuresis.  -Monitor with diuresis.   Anemia,  Secondary to acute blood loss from surgery and underlying chronic disease. Normocytic decreased iron at 23 with normal TIBC and decreased saturation radiates.  Vitamin B-12 levels are elevated at 928.  Reticulocyte panel shows increased immature reticulocyte fraction. -Feraheme ordered  AKI, improving Cr 2.08 on admission, BL ~1.1, today 1.32.  -Morning BMPs -KVO IVF -Avoid nephrotoxic agents -Holding lisinopril-HCTZ combo -Giving Lasix 40 mg IV twice daily -Initiating losartan 25 mg daily  History of alcohol use disorder Patient declined alcohol use on admission; however, patient's PCP reported otherwise.  CIWA is overnight 0, no Ativan on board. -Monitor CIWA's -Call for Ativan if CIWA's >8.  Peripheral edema, resolved Medications include furosemide 40 mg daily -Giving Lasix 40 mg IV given new HFrEF -Strict I's and O's  Hyperlipidemia, chronic, stable Home medication includes  atorvastatin 40 mg daily. Patient's 10-year ASCVD risk 41.1%. -Continue home atorvastatin 40 mg qHS  Hypertension, well controlled Home medication includes Lisinopril-HCTZ 20-25 mg daily.  Blood pressures stable. -Holding lisinopril-HCTZ at this time in the setting of hypotension and AKI -Continue Lasix  History of tobacco abuse Patient reportedly recently quit smoking -Recommend continued cessation  FEN/GI: Carb modified diet PPx: Lovenox  Disposition: Discharged to CIR vs. SNF pending medical improvement (possibly Monday, 6/7)  Subjective:  Patient reports he is doing well this morning but he wants to get some sleep.  Says that he was unable to sleep last night due to all the nurses come in to check on him.  Is asking for Dilaudid to help him sleep.  Denies pain at this time.  Denies any shortness of breath.  Has questions regarding when he may go to inpatient rehab.  Objective: Temp:  [98 F (36.7 C)-98.4 F (36.9 C)] 98.4 F (36.9 C) (06/06 0359) Pulse Rate:  [50-124] 50 (06/05 1641) Resp:  [16-22] 18 (06/06 0359) BP: (105-132)/(58-89) 105/74 (06/06 0359) SpO2:  [91 %-100 %] 98 % (06/06 0746) Weight:  [161.0 kg] 118.2 kg (06/06 0636) Physical Exam: General: Laying in bed sleeping when I enter the room but quickly awakens. Cardiovascular: Tachycardic to117,, occasional PVCs, no murmurs noted Respiratory: Normal work of breathing.  Inspiratory wheeze bilaterally.  No crackles noted.  On 1 L nasal cannula Abdomen: Soft, nontender, positive bowel sounds Extremities: Right lower extremity with 1+ pitting edema. Patient is s/p left BKA, bandages intact with wound VAC in place, no drainage  Laboratory: Recent Labs  Lab 09/30/19 0325 09/30/19 1646 10/01/19 0430  WBC 8.3 8.7 11.0*  HGB 9.1* 8.7* 9.1*  HCT 28.9* 27.1* 28.5*  PLT 433* 442* 498*   Recent Labs  Lab 09/26/19 1449 09/27/19 0301 09/30/19 0325 09/30/19 1646 10/01/19 0430  NA 129*   < > 132* 134* 133*  K 4.6    < > 4.5 4.6 4.5  CL 90*   < > 98 99 101  CO2 26   < > 25 27 25   BUN 64*   < > 35* 27* 23  CREATININE 2.08*   < > 1.32* 1.17 1.13  CALCIUM 9.4   < > 7.9* 8.0* 8.1*  PROT 6.9  --   --   --   --   BILITOT 0.8  --   --   --   --   ALKPHOS 198*  --   --   --   --   ALT 71*  --   --   --   --   AST 53*  --   --   --   --   GLUCOSE 194*   < > 215* 208* 147*   < > = values in this interval not displayed.   Imaging/Diagnostic Tests: No results found.   Gifford Shave, MD 10/01/2019, 8:05 AM PGY-1, [place] Intern pager: [phone], text pages welcome

## 2019-10-02 ENCOUNTER — Encounter (HOSPITAL_COMMUNITY): Payer: Medicare PPO

## 2019-10-02 ENCOUNTER — Inpatient Hospital Stay (HOSPITAL_COMMUNITY): Payer: Medicare PPO

## 2019-10-02 DIAGNOSIS — R0602 Shortness of breath: Secondary | ICD-10-CM

## 2019-10-02 LAB — BASIC METABOLIC PANEL
Anion gap: 6 (ref 5–15)
Anion gap: 7 (ref 5–15)
BUN: 19 mg/dL (ref 8–23)
BUN: 16 mg/dL (ref 8–23)
CO2: 27 mmol/L (ref 22–32)
CO2: 28 mmol/L (ref 22–32)
Calcium: 8.4 mg/dL — ABNORMAL LOW (ref 8.9–10.3)
Calcium: 8.6 mg/dL — ABNORMAL LOW (ref 8.9–10.3)
Chloride: 101 mmol/L (ref 98–111)
Chloride: 100 mmol/L (ref 98–111)
Creatinine, Ser: 1.07 mg/dL (ref 0.61–1.24)
Creatinine, Ser: 1.03 mg/dL (ref 0.61–1.24)
GFR calc Af Amer: >60 mL/min (ref >60)
GFR calc Af Amer: >60 mL/min (ref >60)
GFR calc non Af Amer: >60 mL/min (ref >60)
GFR calc non Af Amer: >60 mL/min (ref >60)
Glucose, Bld: 176 mg/dL — ABNORMAL HIGH (ref 70–99)
Glucose, Bld: 167 mg/dL — ABNORMAL HIGH (ref 70–99)
Potassium: 4.8 mmol/L (ref 3.5–5.1)
Potassium: 4.3 mmol/L (ref 3.5–5.1)
Sodium: 134 mmol/L — ABNORMAL LOW (ref 135–145)
Sodium: 135 mmol/L (ref 135–145)

## 2019-10-02 LAB — HEPARIN LEVEL (UNFRACTIONATED): Heparin Unfractionated: 0.24 IU/mL — ABNORMAL LOW (ref 0.30–0.70)

## 2019-10-02 LAB — GLUCOSE, CAPILLARY
Glucose-Capillary: 170 mg/dL — ABNORMAL HIGH (ref 70–99)
Glucose-Capillary: 183 mg/dL — ABNORMAL HIGH (ref 70–99)
Glucose-Capillary: 155 mg/dL — ABNORMAL HIGH (ref 70–99)
Glucose-Capillary: 173 mg/dL — ABNORMAL HIGH (ref 70–99)

## 2019-10-02 LAB — CBC
HCT: 30.5 % — ABNORMAL LOW (ref 39.0–52.0)
Hemoglobin: 9.5 g/dL — ABNORMAL LOW (ref 13.0–17.0)
MCH: 29.4 pg (ref 26.0–34.0)
MCHC: 31.1 g/dL (ref 30.0–36.0)
MCV: 94.4 fL (ref 80.0–100.0)
Platelets: 522 10*3/uL — ABNORMAL HIGH (ref 150–400)
RBC: 3.23 MIL/uL — ABNORMAL LOW (ref 4.22–5.81)
RDW: 13.1 % (ref 11.5–15.5)
WBC: 10 10*3/uL (ref 4.0–10.5)
nRBC: 0 % (ref 0.0–0.2)

## 2019-10-02 IMAGING — DX DG CHEST 2V
3 series · 3 of 3 positions shown · non-contrast
Comparison: CT chest [DATE] and chest radiograph [DATE].

CLINICAL DATA: Shortness of breath, postop left below-the-knee
amputation.

EXAM:
CHEST - 2 VIEW

[chest lat (1 of 2)]
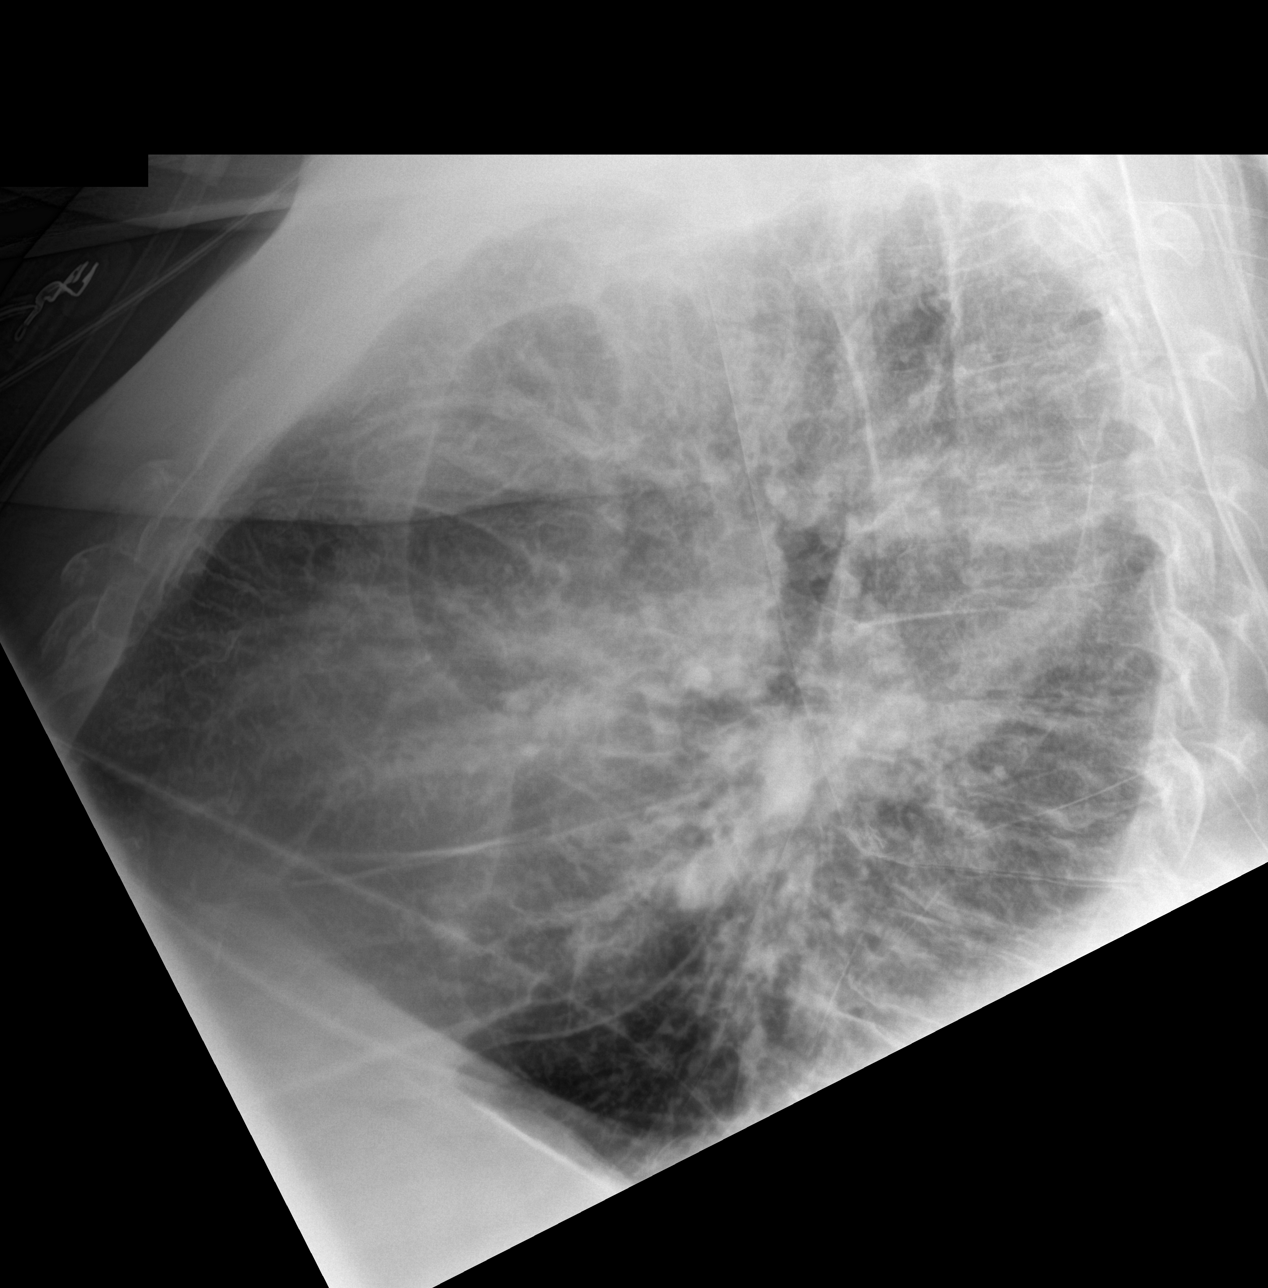

[chest lat (2 of 2)]
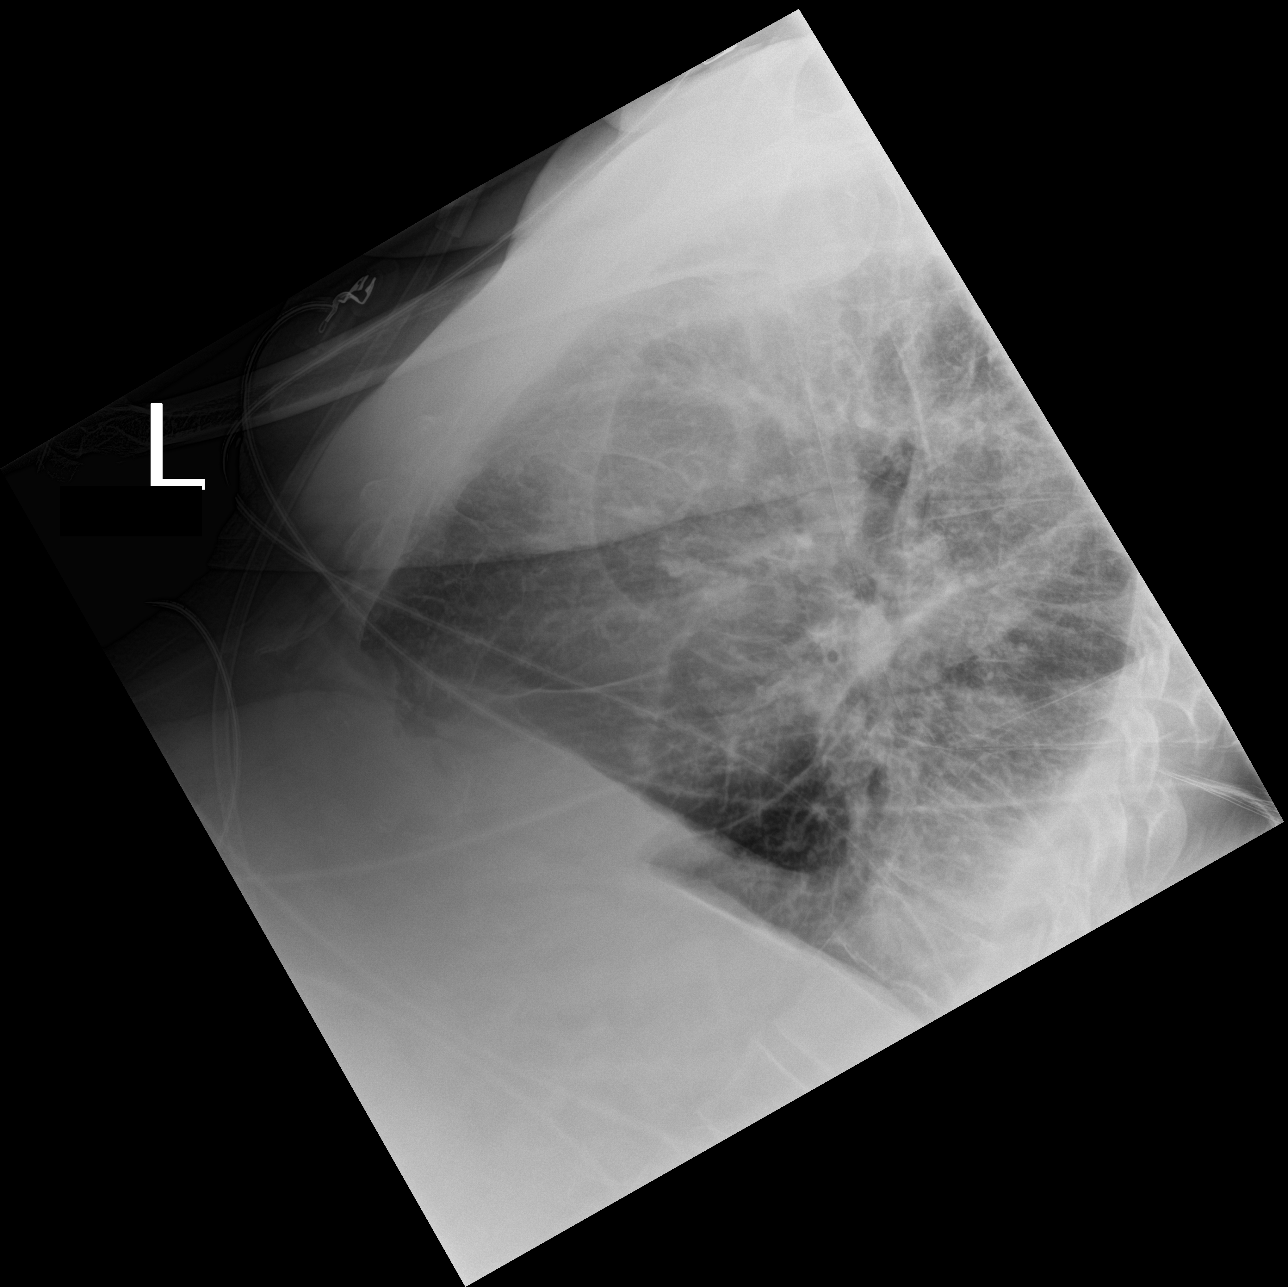

[chest ap]
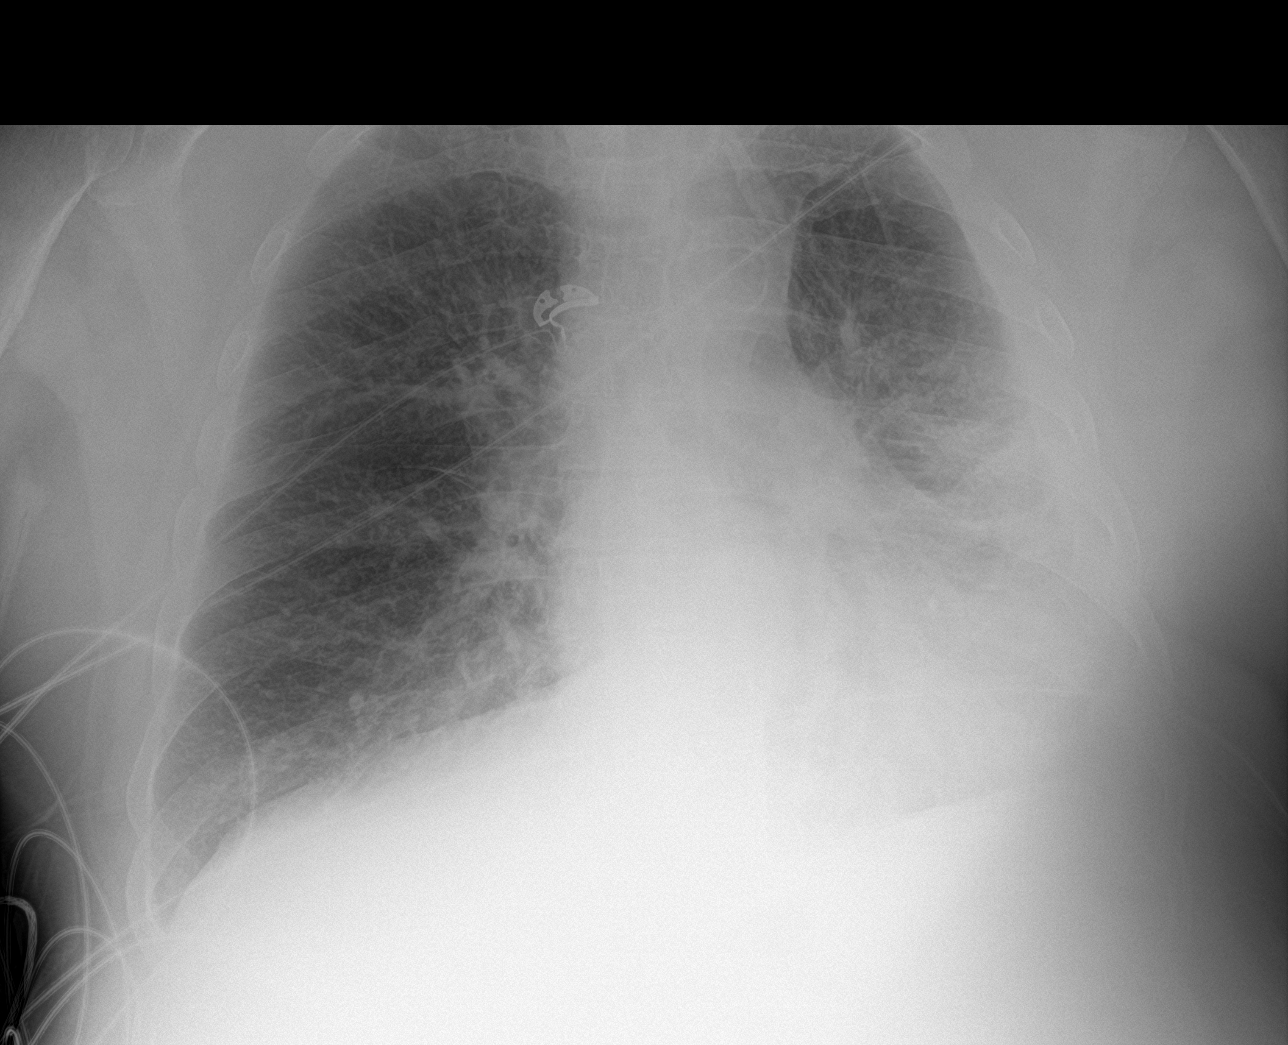

[3 of 3 positions shown; findings below may reference images not displayed]

FINDINGS: Trachea is midline. Thoracic aorta is calcified. Heart size stable.
Increasing pleuroparenchymal opacification in the mid and lower left
hemithorax. Minimal right basilar atelectasis. There may be mild
interstitial prominence bilaterally.
IMPRESSION: 1. Increasing pleuroparenchymal opacification in the mid and lower
left hemithorax may represent atelectasis or pneumonia superimposed
on scarring.
2. Suspect mild interstitial pulmonary edema.
3. Mild right basilar atelectasis.
4.  Aortic atherosclerosis ([WS]-[WS]).

## 2019-10-02 MED ORDER — IPRATROPIUM-ALBUTEROL 0.5-2.5 (3) MG/3ML IN SOLN
3.0000 mL | Freq: Once | RESPIRATORY_TRACT | Status: AC
  Administered 2019-10-02: 3 mL via RESPIRATORY_TRACT
  Filled 2019-10-02: qty 3

## 2019-10-02 MED ORDER — SODIUM CHLORIDE 0.9 % IV SOLN: INTRAVENOUS | Status: DC

## 2019-10-02 MED ORDER — ADULT MULTIVITAMIN W/MINERALS CH
1.0000 | ORAL_TABLET | Freq: Every day | ORAL | Status: DC
  Administered 2019-10-02 – 2019-10-09 (×8): 1 via ORAL
  Filled 2019-10-02 (×8): qty 1

## 2019-10-02 MED ORDER — INSULIN GLARGINE 100 UNIT/ML ~~LOC~~ SOLN
20.0000 [IU] | Freq: Every day | SUBCUTANEOUS | Status: DC
  Administered 2019-10-02 – 2019-10-04 (×3): 20 [IU] via SUBCUTANEOUS
  Filled 2019-10-02 (×4): qty 0.2

## 2019-10-02 MED ORDER — HEPARIN (PORCINE) 25000 UT/250ML-% IV SOLN
1750.0000 [IU]/h | INTRAVENOUS | Status: DC
  Administered 2019-10-02: 1250 [IU]/h via INTRAVENOUS
  Administered 2019-10-03: 1750 [IU]/h via INTRAVENOUS
  Filled 2019-10-02 (×2): qty 250

## 2019-10-02 MED ORDER — ASPIRIN 81 MG PO CHEW
81.0000 mg | CHEWABLE_TABLET | ORAL | Status: AC
  Administered 2019-10-03: 81 mg via ORAL
  Filled 2019-10-02: qty 1

## 2019-10-02 MED ORDER — ENSURE ENLIVE PO LIQD
237.0000 mL | Freq: Three times a day (TID) | ORAL | Status: DC
  Administered 2019-10-03 – 2019-10-08 (×7): 237 mL via ORAL

## 2019-10-02 MED ORDER — METOPROLOL TARTRATE 25 MG PO TABS
25.0000 mg | ORAL_TABLET | Freq: Four times a day (QID) | ORAL | Status: DC
  Administered 2019-10-02 – 2019-10-06 (×15): 25 mg via ORAL
  Filled 2019-10-02 (×15): qty 1

## 2019-10-02 MED ORDER — FUROSEMIDE 10 MG/ML IJ SOLN
40.0000 mg | Freq: Once | INTRAMUSCULAR | Status: AC
  Administered 2019-10-02: 40 mg via INTRAVENOUS
  Filled 2019-10-02: qty 4

## 2019-10-02 MED ORDER — POTASSIUM CHLORIDE CRYS ER 20 MEQ PO TBCR: 40.0000 meq | EXTENDED_RELEASE_TABLET | Freq: Once | ORAL | Status: DC

## 2019-10-02 MED ORDER — PRO-STAT SUGAR FREE PO LIQD
30.0000 mL | Freq: Three times a day (TID) | ORAL | Status: DC
  Administered 2019-10-02 – 2019-10-07 (×8): 30 mL via ORAL
  Filled 2019-10-02 (×14): qty 30

## 2019-10-02 MED ORDER — SODIUM CHLORIDE 0.9% FLUSH
3.0000 mL | Freq: Two times a day (BID) | INTRAVENOUS | Status: DC
  Administered 2019-10-02 – 2019-10-03 (×2): 3 mL via INTRAVENOUS

## 2019-10-02 MED ORDER — FUROSEMIDE 10 MG/ML IJ SOLN
80.0000 mg | Freq: Two times a day (BID) | INTRAMUSCULAR | Status: DC
  Administered 2019-10-02: 80 mg via INTRAVENOUS
  Filled 2019-10-02: qty 8

## 2019-10-02 MED ORDER — FUROSEMIDE 10 MG/ML IJ SOLN: 40.0000 mg | Freq: Two times a day (BID) | INTRAMUSCULAR | Status: DC

## 2019-10-02 MED ORDER — IPRATROPIUM-ALBUTEROL 0.5-2.5 (3) MG/3ML IN SOLN
3.0000 mL | Freq: Two times a day (BID) | RESPIRATORY_TRACT | Status: DC
  Administered 2019-10-02 – 2019-10-04 (×5): 3 mL via RESPIRATORY_TRACT
  Filled 2019-10-02 (×5): qty 3

## 2019-10-02 MED ORDER — RAMELTEON 8 MG PO TABS
8.0000 mg | ORAL_TABLET | Freq: Every day | ORAL | Status: DC
  Administered 2019-10-02 – 2019-10-08 (×8): 8 mg via ORAL
  Filled 2019-10-02 (×10): qty 1

## 2019-10-02 MED ORDER — SODIUM CHLORIDE 0.9% FLUSH: 3.0000 mL | INTRAVENOUS | Status: DC | PRN

## 2019-10-02 MED ORDER — SODIUM CHLORIDE 0.9 % IV SOLN: 250.0000 mL | INTRAVENOUS | Status: DC | PRN

## 2019-10-02 NOTE — Progress Notes (Signed)
PT Cancellation Note  Patient Details Name: Rodney [name] MRN: [id number] DOB: May 22, 1950   Cancelled Treatment:    Reason Eval/Treat Not Completed: Patient declined, no reason specified.  Pt asked to please defer today due to getting bad new about his heart on top of the BKA.  Will see tomorrow as able. 10/02/2019  Ginger Carne., PT Acute Rehabilitation Services [phone]  (pager) [phone]  (office)   [name] 10/02/2019, 5:38 PM

## 2019-10-02 NOTE — Progress Notes (Signed)
ANTICOAGULATION CONSULT NOTE - Initial Consult  Pharmacy Consult for: Heparin Indication: atrial fibrillation  No Known Allergies  Patient Measurements: Height: 6' (182.9 cm) Weight: 114.9 kg (253 lb 4.9 oz) IBW/kg (Calculated) : 77.6 Heparin Dosing Weight: 102.37 kg Weight not updated s/p left BKA  Vital Signs: Temp: 98.2 F (36.8 C) (06/07 1204) Temp Source: Oral (06/07 1204) BP: 121/63 (06/07 1837) Pulse Rate: 105 (06/07 1837)  Labs: Recent Labs    09/30/19 1646 09/30/19 1646 10/01/19 0430 10/02/19 0334 10/02/19 1618 10/02/19 2024  HGB 8.7*   < > 9.1* 9.5*  --   --   HCT 27.1*  --  28.5* 30.5*  --   --   PLT 442*  --  498* 522*  --   --   HEPARINUNFRC  --   --   --   --   --  0.24*  CREATININE 1.17   < > 1.13 1.07 1.03  --    < > = values in this interval not displayed.    Estimated Creatinine Clearance: 91.1 mL/min (by C-G formula based on SCr of 1.03 mg/dL).  Assessment: 48 YOM presenting with left foot cellulitis/osteomyelitis s/p left BKA 09/28/19. Dx w/ new systolic HF (6/4). Dx w/ A-fib (6/7) post-op. Scheduled to undergo LHC on 6/8. Pharmacy consulted to initiate IV heparin drip.  Heparin level 0.24  Goal of Therapy:  Heparin level 0.3-0.5 units/ml Monitor platelets by anticoagulation protocol: Yes   Plan:  Increase heparin to 1750 units / hr  Daily heparin level, CBC  Thank you Anette Guarneri, PharmD  10/02/2019,9:00 PM

## 2019-10-02 NOTE — Progress Notes (Signed)
Progress Note  Patient Name: Rodney [name] Date of Encounter: 10/02/2019  [facility] Cardiologist: Pixie Casino, MD   Subjective   Patient put out 449mL urine overnight. No chest pain but did have severe sob last night.    Inpatient Medications    Scheduled Meds: . acetaminophen  650 mg Oral Q6H  . aspirin EC  81 mg Oral Daily  . atorvastatin  40 mg Oral QHS  . carvedilol  6.25 mg Oral BID WC  . cefdinir  300 mg Oral Q12H  . docusate sodium  100 mg Oral BID  . enoxaparin (LOVENOX) injection  40 mg Subcutaneous Q24H  . furosemide  40 mg Intravenous BID  . insulin aspart  0-15 Units Subcutaneous TID WC  . insulin aspart  0-5 Units Subcutaneous QHS  . insulin glargine  30 Units Subcutaneous QHS  . ipratropium-albuterol  3 mL Nebulization TID  . losartan  25 mg Oral Daily  . ramelteon  8 mg Oral QHS   Continuous Infusions: . sodium chloride Stopped (09/28/19 1359)  . piperacillin-tazobactam (ZOSYN)  IV 3.375 g (10/02/19 0640)   PRN Meds: bisacodyl, magnesium citrate, metoCLOPramide **OR** metoCLOPramide (REGLAN) injection, ondansetron **OR** ondansetron (ZOFRAN) IV, oxyCODONE, oxyCODONE, polyethylene glycol   Vital Signs    Vitals:   10/02/19 0439 10/02/19 0724 10/02/19 0843 10/02/19 0914  BP: 99/61 123/80  (!) 111/57  Pulse: (!) 102 (!) 108 (!) 107 (!) 104  Resp: (!) 23 (!) 22 20 (!) 24  Temp: 98 F (36.7 C) 98 F (36.7 C)    TempSrc: Oral Oral    SpO2: 97% 97% 97% 100%  Weight: 114.9 kg     Height:        Intake/Output Summary (Last 24 hours) at 10/02/2019 0954 Last data filed at 10/02/2019 0751 Gross per 24 hour  Intake --  Output 600 ml  Net -600 ml   Last 3 Weights 10/02/2019 10/01/2019 09/30/2019  Weight (lbs) 253 lb 4.9 oz 260 lb 9.3 oz 263 lb 3.7 oz  Weight (kg) 114.9 kg 118.2 kg 119.4 kg      Telemetry    Afib/flutter, PVC, HR average around 100 bpm - Personally Reviewed  ECG    Afib/flutter, 110 bpm - Personally Reviewed  Physical Exam    GEN: No acute distress.   Neck: No JVD Cardiac: RRR, + murmur, no rubs, or gallops.  Respiratory: Wheezing GI: Soft, nontender, non-distended  MS: No edema; No deformity. Neuro:  Nonfocal  Psych: Normal affect   Labs    High Sensitivity Troponin:  No results for input(s): TROPONINIHS in the last 720 hours.    Chemistry Recent Labs  Lab 09/26/19 1449 09/27/19 0301 09/30/19 1646 10/01/19 0430 10/02/19 0334  NA 129*   < > 134* 133* 134*  K 4.6   < > 4.6 4.5 4.8  CL 90*   < > 99 101 101  CO2 26   < > 27 25 27   GLUCOSE 194*   < > 208* 147* 176*  BUN 64*   < > 27* 23 19  CREATININE 2.08*   < > 1.17 1.13 1.07  CALCIUM 9.4   < > 8.0* 8.1* 8.4*  PROT 6.9  --   --   --   --   ALBUMIN 2.2*  --   --   --   --   AST 53*  --   --   --   --   ALT 71*  --   --   --   --  ALKPHOS 198*  --   --   --   --   BILITOT 0.8  --   --   --   --   GFRNONAA 32*   < > >60 >60 >60  GFRAA 37*   < > >60 >60 >60  ANIONGAP 13   < > 8 7 6    < > = values in this interval not displayed.     Hematology Recent Labs  Lab 09/30/19 1646 10/01/19 0430 10/02/19 0334  WBC 8.7 11.0* 10.0  RBC 2.95*  2.95* 3.09* 3.23*  HGB 8.7* 9.1* 9.5*  HCT 27.1* 28.5* 30.5*  MCV 91.9 92.2 94.4  MCH 29.5 29.4 29.4  MCHC 32.1 31.9 31.1  RDW 13.0 13.2 13.1  PLT 442* 498* 522*    BNP Recent Labs  Lab 09/27/19 1329  BNP 195.1*     DDimer  Recent Labs  Lab 09/26/19 2135  DDIMER 3.25*     Radiology    No results found.  Cardiac Studies   Echo 09/28/19 1. Assessment of his LV function is difficult. Consider repeat echo with  definity contrast once HR is slower . Marland Kitchen Left ventricular ejection  fraction, by estimation, is 35 to 40%. The left ventricle has moderately  decreased function. The left ventricle  demonstrates global hypokinesis. Left ventricular diastolic parameters are  indeterminate.  2. Right ventricular systolic function is mildly reduced. The right  ventricular size is normal.  3.  Left atrial size was mildly dilated.  4. Right atrial size was mildly dilated.  5. The mitral valve is normal in structure. Mild mitral valve  regurgitation. No evidence of mitral stenosis.  6. The aortic valve is not well visualized in the short axis view but is  likely a 3 leaflet valve .Marland Kitchen The aortic valve is tricuspid. Aortic valve  regurgitation is not visualized. Moderate aortic valve stenosis.   Patient Profile    69 y.o. male with no documented hx but now with acute cellulitis of Lt foot, tachycardia, dyspnea, DM, AKI HTN, tobacco use, alcohol use and no hx of CAD who is being seen today for the evaluation of of new cardiomyopathy, CHF, and admit with sepsis, post amputation.  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 Would like to get it done while in the hospital.   Possible Afib RVR - New onset. 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

## 2019-10-02 NOTE — Plan of Care (Signed)
Continue to monitor

## 2019-10-02 NOTE — Progress Notes (Signed)
Initial Nutrition Assessment  DOCUMENTATION CODES:   Not applicable  INTERVENTION:    Ensure Enlive po TID, each supplement provides 350 kcal and 20 grams of protein  30 ml Prostat TID, each supplement provides 100 kcals and 15 grams protein.   MVI daily   NUTRITION DIAGNOSIS:   Increased nutrient needs related to post-op healing as evidenced by estimated needs.  GOAL:   Patient will meet greater than or equal to 90% of their needs  MONITOR:   PO intake, Supplement acceptance, Labs, I & O's, Weight trends  REASON FOR ASSESSMENT:   Consult Diet education  ASSESSMENT:   Patient with PMH significant for HTN, HLD, uncontrolled DM, PVD, and moderate aortic stenosis. 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   6/3- s/p L TMA, wound VAC  Pt with new diagnosis of CHF. Will attempt education at later date.   Denies loss in appetite PTA. Typically eats three meals daily. Unable to elaborate on meal composition. Meal completions charted as 50-100% for his last four meals. Discussed the importance of protein intake to promote post op healing.   Pt unsure of UBW but thinks he has lost weight. Records indicate pt has maintained his weight over the last year.   Medications: colace, 80 mg lasix BID, SS novolog, lantus Labs: Na 134 (L) CBG 183-221  NUTRITION - FOCUSED PHYSICAL EXAM:    Most Recent Value  Orbital Region  No depletion  Upper Arm Region  Mild depletion  Thoracic and Lumbar Region  Unable to assess  Buccal Region  No depletion  Temple Region  No depletion  Clavicle Bone Region  Mild depletion  Clavicle and Acromion Bone Region  Mild depletion  Scapular Bone Region  Unable to assess  Dorsal Hand  No depletion  Patellar Region  Mild depletion  Anterior Thigh Region  Mild depletion  Posterior Calf Region  Mild depletion  Edema (RD Assessment)  Mild  Hair  Reviewed  Eyes  Reviewed  Mouth  Reviewed  Skin   Reviewed  Nails  Reviewed     Diet Order:   Diet Order            Diet NPO time specified Except for: Sips with Meds  Diet effective midnight        Diet NPO time specified  Diet effective midnight        Diet Carb Modified Fluid consistency: Thin; Room service appropriate? Yes  Diet effective now              EDUCATION NEEDS:   Education needs have been addressed  Skin:  Skin Assessment: Skin Integrity Issues: Skin Integrity Issues:: Incisions Incisions: L leg  Last BM:  6/7  Height:   Ht Readings from Last 1 Encounters:  09/28/19 6' (1.829 m)    Weight:   Wt Readings from Last 1 Encounters:  10/02/19 114.9 kg    BMI:  Body mass index is 34.35 kg/m.  Estimated Nutritional Needs:   Kcal:  2600-2800 kcal  Protein:  125-155 grams  Fluid:  >/= 2.5 L/day   Mariana Single RD, LDN Clinical Nutrition Pager listed in [place]

## 2019-10-02 NOTE — Progress Notes (Signed)
Patient ID: Rodney [name], male   DOB: 04/27/1951, 69 y.o.   MRN: [id number] Patient is status post transtibial amputation the wound VAC has no drainage the dressing and stump shrinker are in place.  Anticipate discharge to inpatient versus outpatient rehab.  Patient will continue with the portable Praveena wound VAC pump for the amputation for 1 week after discharge from the hospital.

## 2019-10-02 NOTE — Progress Notes (Addendum)
FMTS Attending Daily Note: [name], MD  Team Pager [phone] Pager [phone]  I have seen and examined this patient, reviewed their chart. I have discussed this patient with the resident. I agree with the resident's findings, assessment and care plan.  My edits are below. Zosyn was to end 6/6. Discontinued this AM. Will complete therapy for CAP with  PO third generation.     Family Medicine Teaching Service Daily Progress Note Intern Pager: [phone]  Patient name: Rodney [name] Medical record number: [id number] Date of birth: 1951/04/03 Age: 69 y.o. Gender: male  Primary Care Provider: [name], MD Consultants: Orthopedics Code Status: Full code  Pt Overview and Major Events to Date:   Assessment and Plan: Rodney [name] is a 69 y.o. male presenting with left foot cellulitis/ulceration. PMH is significant for hypertension, hyperlipidemia, tobacco abuse, type 2 diabetes, dyspnea, peripheral edema.  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  WBCs today 10.0 from 11.0 yesterday but improved from admission which was 17.  Remains afebrile, vitals stabilizing. -Orthopedics consulted, appreciate recommendations -Zosyn ended today -Patient started on cefdinir 300 mg twice daily for today and tomorrow -PT/OT eval and treat - recommend CIR versus SNF for d/c -CIR consulted and recommend admission for inpatient rehabilitation pending clear disposition plan -Vitals per routine -Morning CBCs - Standing Tylenol and oxycodone PRN for pain   Acute Hypoxic respiratory failure secondary to pneumonia and new onset heart failure with reduced EF (acute). Worsening symptoms this AM--likely combination of new atrial fibrillation and HF. Improved with diuresis and nebulizers  - Abx as above, minimum of 5 days, then ceftriaxone for 2 days starting 6/7. - Diuresis as below - Appreciate Cardiology input and care-- LE  arterial studies ordered  Acute heart failure in the setting of new diagnosis of HFrEF, stable Patient had echo 6/3 showing LVEF 35-40%. RV systolic function is mildly reduced, both atria mildly dilated.  Will ultimately need left heart cath. -Cardiology consulted, appreciate recommendations -Lasix increased to 80 mg IV Lasix twice daily -Plan for left heart cath tomorrow -Continue aspirin - Metoprolol 25 mg q6h--will need to transition to XL  -Started on Cozaar 25 mg daily -Continue smoking cessation counseling -Strict I's and O's  New onset Atrial Fibrillation  CHADS2VASC is 5.  EKG on 6/6 significant for a fib with RVR. Denies any chest pain of SOB.  Cardiology consulted, appreciate recs.  Patient had EKG performed this morning showing atrial fibrillation Likely cause of increased dyspnea and symptoms this AM.  -Cardiology consulted, appreciate recommendations -Lasix 80 mg twice daily -Metoprolol 25 mg q6h for rate control -Plan for left heart cath tomorrow -Started heparin drip -We will likely need TEE  T2DM with neuropathy  PVD, stable Hemoglobin A1c 09/21/2019 was 8.7.  CBG this a.m. 208, stable.  Medications include Novolin 70/30 75 units in the morning and 55 units in the evening, Metformin 1000 mg twice daily -Hold patient's home insulin, Metformin -Lantus 30 units qHS -Moderate SSI -CBG monitoring -Vascular Arterial Duplex of LE ordered to evaluate PVD  Hyponatremia, 132 6/5.  Most likely due to hypervolemia, improving with diuresis.  -Monitor with diuresis.   Anemia, in part IDA with low iron saturations, ferritin, likely multifactorial.  Secondary to acute blood loss from surgery and underlying chronic disease. Normocytic decreased iron at 23 with normal TIBC and decreased saturation radiates.  Vitamin B-12 levels are elevated at 928.  Reticulocyte panel shows  increased immature reticulocyte fraction. -Feraheme given 6/6.   AKI, improving Cr 2.08 on admission, BL  ~1.1, today 1.07. -Morning BMPs -KVO IVF -Avoid nephrotoxic agents -Holding lisinopril-HCTZ combo -Giving Lasix 80 mg IV twice daily -Initiating losartan 25 mg daily  History of alcohol use disorder Patient declined alcohol use on admission; however, patient's PCP reported otherwise.  CIWA is overnight 0, no Ativan on board. -Monitor CIWA's -Call for Ativan if CIWA's >8.  Peripheral edema, resolved Medications include furosemide 40 mg daily -Giving Lasix 40 mg IV given new HFrEF -Strict I's and O's  Hyperlipidemia, chronic, stable Home medication includes atorvastatin 40 mg daily. Patient's 10-year ASCVD risk 41.1%. -Continue home atorvastatin 40 mg qHS  Hypertension, well controlled Home medication includes Lisinopril-HCTZ 20-25 mg daily.  Blood pressures stable. -Holding lisinopril-HCTZ at this time in the setting of hypotension and AKI -Continue Lasix  History of tobacco abuse Patient reportedly recently quit smoking -Recommend continued cessation  FEN/GI: Carb modified diet, n.p.o. at midnight PPx: Heparin gtt   Disposition: LHC on 6/8   Subjective:  Patient reports he is doing okay this morning but has questions regarding when he will be able to go to rehab.  He reports that last night went well except for he woke up feeling like an elephant was sitting on his chest making it difficult for him to breathe.  Denies any chest pain.  Objective: Temp:  [97.8 F (36.6 C)-98.4 F (36.9 C)] 98.2 F (36.8 C) (06/07 1204) Pulse Rate:  [71-127] 99 (06/07 1204) Resp:  [17-27] 17 (06/07 1204) BP: (97-133)/(50-82) 128/70 (06/07 1204) SpO2:  [96 %-100 %] 98 % (06/07 1204) Weight:  [114.9 kg] 114.9 kg (06/07 0439) Physical Exam: General: Sitting in bed when I enter the room talking with the significant other Cardiovascular: Tachycardic to 107., no murmurs noted Respiratory: Mildly increased work of breathing, inspiratory wheeze bilaterally.  On 2 L nasal cannula Abdomen:  Soft, nontender, positive bowel sounds Extremities: Right lower extremity with 1+ pitting edema. Patient is s/p left BKA, bandages intact with wound VAC in place, no drainage  Laboratory: Recent Labs  Lab 09/30/19 1646 10/01/19 0430 10/02/19 0334  WBC 8.7 11.0* 10.0  HGB 8.7* 9.1* 9.5*  HCT 27.1* 28.5* 30.5*  PLT 442* 498* 522*   Recent Labs  Lab 09/26/19 1449 09/27/19 0301 09/30/19 1646 10/01/19 0430 10/02/19 0334  NA 129*   < > 134* 133* 134*  K 4.6   < > 4.6 4.5 4.8  CL 90*   < > 99 101 101  CO2 26   < > 27 25 27   BUN 64*   < > 27* 23 19  CREATININE 2.08*   < > 1.17 1.13 1.07  CALCIUM 9.4   < > 8.0* 8.1* 8.4*  PROT 6.9  --   --   --   --   BILITOT 0.8  --   --   --   --   ALKPHOS 198*  --   --   --   --   ALT 71*  --   --   --   --   AST 53*  --   --   --   --   GLUCOSE 194*   < > 208* 147* 176*   < > = values in this interval not displayed.   Imaging/Diagnostic Tests: DG Chest 2 View  Result Date: 10/02/2019 CLINICAL DATA:  Shortness of breath, postop left below-the-knee amputation. EXAM: CHEST - 2 VIEW COMPARISON:  CT chest 09/28/2019 and chest radiograph 09/26/2019. FINDINGS: Trachea is midline. Thoracic aorta is calcified. Heart size stable. Increasing pleuroparenchymal opacification in the mid and lower left hemithorax. Minimal right basilar atelectasis. There may be mild interstitial prominence bilaterally. IMPRESSION: 1. Increasing pleuroparenchymal opacification in the mid and lower left hemithorax may represent atelectasis or pneumonia superimposed on scarring. 2. Suspect mild interstitial pulmonary edema. 3. Mild right basilar atelectasis. 4.  Aortic atherosclerosis (ICD10-I70.0). Electronically Signed   By: Lorin Picket M.D.   On: 10/02/2019 11:40     Gifford Shave, MD 10/02/2019, 12:32 PM PGY-1, [place] Intern pager: [phone], text pages welcome

## 2019-10-02 NOTE — H&P (View-Only) (Signed)
Progress Note  Patient Name: Rodney [name] Date of Encounter: 10/02/2019  [facility] Cardiologist: Pixie Casino, MD   Subjective   Patient put out 429mL urine overnight. No chest pain but did have severe sob last night.    Inpatient Medications    Scheduled Meds: . acetaminophen  650 mg Oral Q6H  . aspirin EC  81 mg Oral Daily  . atorvastatin  40 mg Oral QHS  . carvedilol  6.25 mg Oral BID WC  . cefdinir  300 mg Oral Q12H  . docusate sodium  100 mg Oral BID  . enoxaparin (LOVENOX) injection  40 mg Subcutaneous Q24H  . furosemide  40 mg Intravenous BID  . insulin aspart  0-15 Units Subcutaneous TID WC  . insulin aspart  0-5 Units Subcutaneous QHS  . insulin glargine  30 Units Subcutaneous QHS  . ipratropium-albuterol  3 mL Nebulization TID  . losartan  25 mg Oral Daily  . ramelteon  8 mg Oral QHS   Continuous Infusions: . sodium chloride Stopped (09/28/19 1359)  . piperacillin-tazobactam (ZOSYN)  IV 3.375 g (10/02/19 0640)   PRN Meds: bisacodyl, magnesium citrate, metoCLOPramide **OR** metoCLOPramide (REGLAN) injection, ondansetron **OR** ondansetron (ZOFRAN) IV, oxyCODONE, oxyCODONE, polyethylene glycol   Vital Signs    Vitals:   10/02/19 0439 10/02/19 0724 10/02/19 0843 10/02/19 0914  BP: 99/61 123/80  (!) 111/57  Pulse: (!) 102 (!) 108 (!) 107 (!) 104  Resp: (!) 23 (!) 22 20 (!) 24  Temp: 98 F (36.7 C) 98 F (36.7 C)    TempSrc: Oral Oral    SpO2: 97% 97% 97% 100%  Weight: 114.9 kg     Height:        Intake/Output Summary (Last 24 hours) at 10/02/2019 0954 Last data filed at 10/02/2019 0751 Gross per 24 hour  Intake --  Output 600 ml  Net -600 ml   Last 3 Weights 10/02/2019 10/01/2019 09/30/2019  Weight (lbs) 253 lb 4.9 oz 260 lb 9.3 oz 263 lb 3.7 oz  Weight (kg) 114.9 kg 118.2 kg 119.4 kg      Telemetry    Afib/flutter, PVC, HR average around 100 bpm - Personally Reviewed  ECG    Afib/flutter, 110 bpm - Personally Reviewed  Physical Exam    GEN: No acute distress.   Neck: No JVD Cardiac: RRR, + murmur, no rubs, or gallops.  Respiratory: Wheezing GI: Soft, nontender, non-distended  MS: No edema; No deformity. Neuro:  Nonfocal  Psych: Normal affect   Labs    High Sensitivity Troponin:  No results for input(s): TROPONINIHS in the last 720 hours.    Chemistry Recent Labs  Lab 09/26/19 1449 09/27/19 0301 09/30/19 1646 10/01/19 0430 10/02/19 0334  NA 129*   < > 134* 133* 134*  K 4.6   < > 4.6 4.5 4.8  CL 90*   < > 99 101 101  CO2 26   < > 27 25 27   GLUCOSE 194*   < > 208* 147* 176*  BUN 64*   < > 27* 23 19  CREATININE 2.08*   < > 1.17 1.13 1.07  CALCIUM 9.4   < > 8.0* 8.1* 8.4*  PROT 6.9  --   --   --   --   ALBUMIN 2.2*  --   --   --   --   AST 53*  --   --   --   --   ALT 71*  --   --   --   --  ALKPHOS 198*  --   --   --   --   BILITOT 0.8  --   --   --   --   GFRNONAA 32*   < > >60 >60 >60  GFRAA 37*   < > >60 >60 >60  ANIONGAP 13   < > 8 7 6    < > = values in this interval not displayed.     Hematology Recent Labs  Lab 09/30/19 1646 10/01/19 0430 10/02/19 0334  WBC 8.7 11.0* 10.0  RBC 2.95*  2.95* 3.09* 3.23*  HGB 8.7* 9.1* 9.5*  HCT 27.1* 28.5* 30.5*  MCV 91.9 92.2 94.4  MCH 29.5 29.4 29.4  MCHC 32.1 31.9 31.1  RDW 13.0 13.2 13.1  PLT 442* 498* 522*    BNP Recent Labs  Lab 09/27/19 1329  BNP 195.1*     DDimer  Recent Labs  Lab 09/26/19 2135  DDIMER 3.25*     Radiology    No results found.  Cardiac Studies   Echo 09/28/19 1. Assessment of his LV function is difficult. Consider repeat echo with  definity contrast once HR is slower . Marland Kitchen Left ventricular ejection  fraction, by estimation, is 35 to 40%. The left ventricle has moderately  decreased function. The left ventricle  demonstrates global hypokinesis. Left ventricular diastolic parameters are  indeterminate.  2. Right ventricular systolic function is mildly reduced. The right  ventricular size is normal.  3.  Left atrial size was mildly dilated.  4. Right atrial size was mildly dilated.  5. The mitral valve is normal in structure. Mild mitral valve  regurgitation. No evidence of mitral stenosis.  6. The aortic valve is not well visualized in the short axis view but is  likely a 3 leaflet valve .Marland Kitchen The aortic valve is tricuspid. Aortic valve  regurgitation is not visualized. Moderate aortic valve stenosis.   Patient Profile    69 y.o. male with no documented hx but now with acute cellulitis of Lt foot, tachycardia, dyspnea, DM, AKI HTN, tobacco use, alcohol use and no hx of CAD who is being seen today for the evaluation of of new cardiomyopathy, CHF, and admit with sepsis, post amputation.  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 Would like to get it done while in the hospital.   Possible Afib RVR - New onset. 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

## 2019-10-02 NOTE — Progress Notes (Signed)
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    Estimated Creatinine Clearance: 87.6 mL/min (by C-G formula based on SCr of 1.07 mg/dL).   Medical History: Past Medical History:  Diagnosis Date  . Diabetes mellitus without complication ([place])   . HLD (hyperlipidemia)   . Hypertension     Medications:  Scheduled:  . acetaminophen  650 mg Oral Q6H  . aspirin EC  81 mg Oral Daily  . atorvastatin  40 mg Oral QHS  . cefdinir  300 mg Oral Q12H  . docusate sodium  100 mg Oral BID  . furosemide  80 mg Intravenous BID  . insulin aspart  0-15 Units Subcutaneous TID WC  . insulin aspart  0-5 Units Subcutaneous QHS  . insulin glargine  30 Units Subcutaneous QHS  . ipratropium-albuterol  3 mL Nebulization BID  . losartan  25 mg Oral Daily  . metoprolol tartrate  25 mg Oral Q6H  . ramelteon  8 mg Oral QHS  . 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 Dx w/ new systolic HF (6/4). Dx w/ A-fib (6/7) post-op. Scheduled to undergo LHC on 6/8. Pharmacy consulted to initiate IV heparin drip.  CBC reviewed. PLT elevated and HgB low possibly d/t surgery.    Prior [facility] w/ heparin 6/1-6/3. Was previously therapeutic at 2250 units/hr prior to [place] surgery on 6/3. Weight not  updated s/p BKA. Previous rate thought to be too aggressive and lower rate initiated post surgery. No AC PTA per med history.   Goal of Therapy:  Heparin level 0.3-0.5 units/ml Monitor platelets by anticoagulation protocol: Yes   Plan:  Start heparin infusion at 1500 units/hr.  -Check 6 h heparin level  -Daily heparin level and CBC -Monitor PLT, Hgb/HCT, s/sx of bleeding.   Elita Quick, PharmD Candidate 10/02/2019,12:16 PM

## 2019-10-03 ENCOUNTER — Encounter (HOSPITAL_COMMUNITY)
Admission: EM | Disposition: A | Discharge status: Skilled Nursing Facility | Payer: Self-pay | Source: Home / Self Care | Attending: Family Medicine

## 2019-10-03 ENCOUNTER — Encounter (HOSPITAL_COMMUNITY): Payer: Medicare PPO

## 2019-10-03 DIAGNOSIS — J189 Pneumonia, unspecified organism: Secondary | ICD-10-CM

## 2019-10-03 DIAGNOSIS — I2510 Atherosclerotic heart disease of native coronary artery without angina pectoris: Secondary | ICD-10-CM

## 2019-10-03 DIAGNOSIS — I480 Paroxysmal atrial fibrillation: Secondary | ICD-10-CM

## 2019-10-03 HISTORY — PX: LEFT HEART CATH AND CORONARY ANGIOGRAPHY: CATH118249

## 2019-10-03 LAB — CBC
HCT: 32.3 % — ABNORMAL LOW (ref 39.0–52.0)
Hemoglobin: 10.3 g/dL — ABNORMAL LOW (ref 13.0–17.0)
MCH: 29.5 pg (ref 26.0–34.0)
MCHC: 31.9 g/dL (ref 30.0–36.0)
MCV: 92.6 fL (ref 80.0–100.0)
Platelets: 514 10*3/uL — ABNORMAL HIGH (ref 150–400)
RBC: 3.49 MIL/uL — ABNORMAL LOW (ref 4.22–5.81)
RDW: 13.2 % (ref 11.5–15.5)
WBC: 10 10*3/uL (ref 4.0–10.5)
nRBC: 0 % (ref 0.0–0.2)

## 2019-10-03 LAB — BASIC METABOLIC PANEL
Anion gap: 9 (ref 5–15)
BUN: 17 mg/dL (ref 8–23)
CO2: 29 mmol/L (ref 22–32)
Calcium: 9.1 mg/dL (ref 8.9–10.3)
Chloride: 100 mmol/L (ref 98–111)
Creatinine, Ser: 0.97 mg/dL (ref 0.61–1.24)
GFR calc Af Amer: >60 mL/min (ref >60)
GFR calc non Af Amer: >60 mL/min (ref >60)
Glucose, Bld: 148 mg/dL — ABNORMAL HIGH (ref 70–99)
Potassium: 4.2 mmol/L (ref 3.5–5.1)
Sodium: 138 mmol/L (ref 135–145)

## 2019-10-03 LAB — GLUCOSE, CAPILLARY
Glucose-Capillary: 163 mg/dL — ABNORMAL HIGH (ref 70–99)
Glucose-Capillary: 177 mg/dL — ABNORMAL HIGH (ref 70–99)
Glucose-Capillary: 167 mg/dL — ABNORMAL HIGH (ref 70–99)
Glucose-Capillary: 173 mg/dL — ABNORMAL HIGH (ref 70–99)

## 2019-10-03 LAB — HEPARIN LEVEL (UNFRACTIONATED): Heparin Unfractionated: 0.37 IU/mL (ref 0.30–0.70)

## 2019-10-03 SURGERY — LEFT HEART CATH AND CORONARY ANGIOGRAPHY
Anesthesia: LOCAL

## 2019-10-03 MED ORDER — ATORVASTATIN CALCIUM 80 MG PO TABS
80.0000 mg | ORAL_TABLET | Freq: Every day | ORAL | Status: DC
  Administered 2019-10-03 – 2019-10-09 (×7): 80 mg via ORAL
  Filled 2019-10-03 (×8): qty 1

## 2019-10-03 MED ORDER — HEPARIN (PORCINE) IN NACL 1000-0.9 UT/500ML-% IV SOLN
INTRAVENOUS | Status: AC
  Filled 2019-10-03: qty 500

## 2019-10-03 MED ORDER — ACETAMINOPHEN 325 MG PO TABS: 650.0000 mg | ORAL_TABLET | ORAL | Status: DC | PRN

## 2019-10-03 MED ORDER — HEPARIN SODIUM (PORCINE) 1000 UNIT/ML IJ SOLN
INTRAMUSCULAR | Status: AC
  Filled 2019-10-03: qty 1

## 2019-10-03 MED ORDER — ONDANSETRON HCL 4 MG/2ML IJ SOLN: 4.0000 mg | Freq: Four times a day (QID) | INTRAMUSCULAR | Status: DC | PRN

## 2019-10-03 MED ORDER — SODIUM CHLORIDE 0.9 % IV SOLN: 250.0000 mL | INTRAVENOUS | Status: DC | PRN

## 2019-10-03 MED ORDER — IOHEXOL 350 MG/ML SOLN
INTRAVENOUS | Status: DC | PRN
  Administered 2019-10-03: 60 mL via INTRA_ARTERIAL

## 2019-10-03 MED ORDER — FENTANYL CITRATE (PF) 100 MCG/2ML IJ SOLN
INTRAMUSCULAR | Status: DC | PRN
  Administered 2019-10-03: 25 ug via INTRAVENOUS

## 2019-10-03 MED ORDER — MIDAZOLAM HCL 2 MG/2ML IJ SOLN
INTRAMUSCULAR | Status: DC | PRN
  Administered 2019-10-03: 1 mg via INTRAVENOUS

## 2019-10-03 MED ORDER — FUROSEMIDE 10 MG/ML IJ SOLN
80.0000 mg | Freq: Two times a day (BID) | INTRAMUSCULAR | Status: DC
  Administered 2019-10-03 – 2019-10-04 (×4): 80 mg via INTRAVENOUS
  Filled 2019-10-03 (×4): qty 8

## 2019-10-03 MED ORDER — LIDOCAINE HCL (PF) 1 % IJ SOLN
INTRAMUSCULAR | Status: DC | PRN
  Administered 2019-10-03: 2 mL

## 2019-10-03 MED ORDER — HEPARIN SODIUM (PORCINE) 1000 UNIT/ML IJ SOLN
INTRAMUSCULAR | Status: DC | PRN
  Administered 2019-10-03: 6000 [IU] via INTRAVENOUS

## 2019-10-03 MED ORDER — APIXABAN 5 MG PO TABS: 5.0000 mg | ORAL_TABLET | Freq: Two times a day (BID) | ORAL | Status: DC

## 2019-10-03 MED ORDER — SODIUM CHLORIDE 0.9 % IV SOLN: INTRAVENOUS | Status: AC

## 2019-10-03 MED ORDER — HYDRALAZINE HCL 20 MG/ML IJ SOLN: 10.0000 mg | INTRAMUSCULAR | Status: AC | PRN

## 2019-10-03 MED ORDER — NITROGLYCERIN 1 MG/10 ML FOR IR/CATH LAB
INTRA_ARTERIAL | Status: AC
  Filled 2019-10-03: qty 10

## 2019-10-03 MED ORDER — VERAPAMIL HCL 2.5 MG/ML IV SOLN
INTRAVENOUS | Status: AC
  Filled 2019-10-03: qty 2

## 2019-10-03 MED ORDER — FENTANYL CITRATE (PF) 100 MCG/2ML IJ SOLN
INTRAMUSCULAR | Status: AC
  Filled 2019-10-03: qty 2

## 2019-10-03 MED ORDER — MIDAZOLAM HCL 2 MG/2ML IJ SOLN
INTRAMUSCULAR | Status: AC
  Filled 2019-10-03: qty 2

## 2019-10-03 MED ORDER — LABETALOL HCL 5 MG/ML IV SOLN: 10.0000 mg | INTRAVENOUS | Status: AC | PRN

## 2019-10-03 MED ORDER — SODIUM CHLORIDE 0.9% FLUSH: 3.0000 mL | INTRAVENOUS | Status: DC | PRN

## 2019-10-03 MED ORDER — APIXABAN 5 MG PO TABS
5.0000 mg | ORAL_TABLET | Freq: Two times a day (BID) | ORAL | Status: DC
  Administered 2019-10-03 – 2019-10-09 (×13): 5 mg via ORAL
  Filled 2019-10-03 (×12): qty 1

## 2019-10-03 MED ORDER — VERAPAMIL HCL 2.5 MG/ML IV SOLN
INTRA_ARTERIAL | Status: DC | PRN
  Administered 2019-10-03: 5 mL via INTRA_ARTERIAL

## 2019-10-03 MED ORDER — APIXABAN 5 MG PO TABS: 10.0000 mg | ORAL_TABLET | Freq: Once | ORAL | Status: DC

## 2019-10-03 MED ORDER — ASPIRIN 81 MG PO CHEW
81.0000 mg | CHEWABLE_TABLET | Freq: Every day | ORAL | Status: DC
  Administered 2019-10-03 – 2019-10-09 (×7): 81 mg via ORAL
  Filled 2019-10-03 (×7): qty 1

## 2019-10-03 MED ORDER — SPIRONOLACTONE 12.5 MG HALF TABLET
12.5000 mg | ORAL_TABLET | Freq: Every day | ORAL | Status: DC
  Administered 2019-10-03 – 2019-10-09 (×7): 12.5 mg via ORAL
  Filled 2019-10-03 (×7): qty 1

## 2019-10-03 MED ORDER — MORPHINE SULFATE (PF) 2 MG/ML IV SOLN: 2.0000 mg | INTRAVENOUS | Status: DC | PRN

## 2019-10-03 MED ORDER — SODIUM CHLORIDE 0.9% FLUSH
3.0000 mL | Freq: Two times a day (BID) | INTRAVENOUS | Status: DC
  Administered 2019-10-03: 3 mL via INTRAVENOUS

## 2019-10-03 MED ORDER — HEPARIN (PORCINE) IN NACL 1000-0.9 UT/500ML-% IV SOLN
INTRAVENOUS | Status: DC | PRN
  Administered 2019-10-03 (×2): 500 mL

## 2019-10-03 SURGICAL SUPPLY — 13 items
CATH INFINITI 5FR ANG PIGTAIL (CATHETERS) ×1 IMPLANT
CATH INFINITI JR4 5F (CATHETERS) ×1 IMPLANT
CATH OPTITORQUE TIG 4.0 5F (CATHETERS) ×1 IMPLANT
DEVICE RAD COMP TR BAND LRG (VASCULAR PRODUCTS) ×1 IMPLANT
GLIDESHEATH SLEND A-KIT 6F 22G (SHEATH) ×1 IMPLANT
GUIDEWIRE INQWIRE 1.5J.035X260 (WIRE) IMPLANT
INQWIRE 1.5J .035X260CM (WIRE) ×2
KIT HEART LEFT (KITS) ×2 IMPLANT
PACK CARDIAC CATHETERIZATION (CUSTOM PROCEDURE TRAY) ×2 IMPLANT
TRANSDUCER W/STOPCOCK (MISCELLANEOUS) ×2 IMPLANT
TUBING CIL FLEX 10 FLL-RA (TUBING) ×2 IMPLANT
WIRE EMERALD ST .035X260CM (WIRE) ×1 IMPLANT
WIRE HI TORQ VERSACORE-J 145CM (WIRE) ×1 IMPLANT

## 2019-10-03 NOTE — Interval H&P Note (Signed)
Cath Lab Visit (complete for each Cath Lab visit)  Clinical Evaluation Leading to the Procedure:   ACS: No.  Non-ACS:    Anginal Classification: CCS I  Anti-ischemic medical therapy: No Therapy  Non-Invasive Test Results: No non-invasive testing performed  Prior CABG: No previous CABG      History and Physical Interval Note:  10/03/2019 7:31 AM  Rodney [name]  has presented today for surgery, with the diagnosis of cardiomyopathy.  The various methods of treatment have been discussed with the patient and family. After consideration of risks, benefits and other options for treatment, the patient has consented to  Procedure(s): LEFT HEART CATH AND CORONARY ANGIOGRAPHY (N/A) as a surgical intervention.  The patient's history has been reviewed, patient examined, no change in status, stable for surgery.  I have reviewed the patient's chart and labs.  Questions were answered to the patient's satisfaction.     Quay Burow

## 2019-10-03 NOTE — Progress Notes (Signed)
Inpatient Rehabilitation-Admissions Coordinator   Notified by [employer] company that his request for CIR, despite completion of peer to peer conference today, has been denied. Spoke with Dr. Owens Shark who requests expedited appeal. Will initiate expedited appeal tomorrow per MD request.   Raechel Ache, OTR/L  Rehab Admissions Coordinator  [phone] 10/03/2019 5:59 PM

## 2019-10-03 NOTE — Progress Notes (Signed)
PT Cancellation Note  Patient Details Name: Rodney [name] MRN: [id number] DOB: 1950/09/03   Cancelled Treatment:    Reason Eval/Treat Not Completed: Other (comment). Pt with cardiac cath this AM. Per OT pt felt he couldn't do anything today because he wasn't allowed to use rt hand. Will try again tomorrow.   Shary Decamp [facility] 10/03/2019, 3:52 PM [place] Pager [phone] Office [phone]

## 2019-10-03 NOTE — Progress Notes (Addendum)
Cardiology Progress Note  Patient ID: [name] MRN: [id number] DOB: [birth year] Date of Encounter: 10/03/2019  Primary Cardiologist: Rodney Casino, MD  Subjective   Chief Complaint: SOB  HPI: LHC with CTO of RCA. Reports no issues with cath. Net negative 3L.   ROS:  All other ROS reviewed and negative. Pertinent positives noted in the HPI.     Inpatient Medications  Scheduled Meds: . acetaminophen  650 mg Oral Q6H  . apixaban  10 mg Oral Once  . [START ON 10/04/2019] apixaban  5 mg Oral BID  . aspirin  81 mg Oral Daily  . atorvastatin  80 mg Oral Daily  . cefdinir  300 mg Oral Q12H  . docusate sodium  100 mg Oral BID  . feeding supplement (ENSURE ENLIVE)  237 mL Oral TID BM  . feeding supplement (PRO-STAT SUGAR FREE 64)  30 mL Oral TID  . furosemide  80 mg Intravenous BID  . insulin aspart  0-15 Units Subcutaneous TID WC  . insulin aspart  0-5 Units Subcutaneous QHS  . insulin glargine  20 Units Subcutaneous QHS  . ipratropium-albuterol  3 mL Nebulization BID  . losartan  25 mg Oral Daily  . metoprolol tartrate  25 mg Oral Q6H  . multivitamin with minerals  1 tablet Oral Daily  . ramelteon  8 mg Oral QHS  . sodium chloride flush  3 mL Intravenous Q12H  . sodium chloride flush  3 mL Intravenous Q12H  . spironolactone  12.5 mg Oral Daily   Continuous Infusions: . sodium chloride Stopped (09/28/19 1359)  . sodium chloride 75 mL/hr at 10/03/19 0832  . sodium chloride     PRN Meds: sodium chloride, acetaminophen, bisacodyl, hydrALAZINE, labetalol, magnesium citrate, metoCLOPramide **OR** metoCLOPramide (REGLAN) injection, morphine injection, ondansetron **OR** ondansetron (ZOFRAN) IV, ondansetron (ZOFRAN) IV, oxyCODONE, oxyCODONE, polyethylene glycol, sodium chloride flush   Vital Signs   Vitals:   10/03/19 0503 10/03/19 0532 10/03/19 0735 10/03/19 0900  BP: 132/85   114/81  Pulse: (!) 105   84  Resp: (!) 24 17  (!) 24  Temp: 98.3 F (36.8 C)     TempSrc:  Oral   Oral  SpO2: 96% 96% 98% 99%  Weight:      Height:        Intake/Output Summary (Last 24 hours) at 10/03/2019 1045 Last data filed at 10/03/2019 0053 Gross per 24 hour  Intake 186.75 ml  Output 2800 ml  Net -2613.25 ml   Last 3 Weights 10/03/2019 10/02/2019 10/01/2019  Weight (lbs) 240 lb 1.3 oz 253 lb 4.9 oz 260 lb 9.3 oz  Weight (kg) 108.9 kg 114.9 kg 118.2 kg      Telemetry  Overnight telemetry shows Afib 90s, which I personally reviewed.   ECG  The most recent ECG shows afib 109 bpm, which I personally reviewed.   Physical Exam   Vitals:   10/03/19 0503 10/03/19 0532 10/03/19 0735 10/03/19 0900  BP: 132/85   114/81  Pulse: (!) 105   84  Resp: (!) 24 17  (!) 24  Temp: 98.3 F (36.8 C)     TempSrc: Oral   Oral  SpO2: 96% 96% 98% 99%  Weight:      Height:         Intake/Output Summary (Last 24 hours) at 10/03/2019 1045 Last data filed at 10/03/2019 0053 Gross per 24 hour  Intake 186.75 ml  Output 2800 ml  Net -2613.25 ml    Last 3  Weights 10/03/2019 10/02/2019 10/01/2019  Weight (lbs) 240 lb 1.3 oz 253 lb 4.9 oz 260 lb 9.3 oz  Weight (kg) 108.9 kg 114.9 kg 118.2 kg    Body mass index is 32.56 kg/m.   General: Well nourished, well developed, in no acute distress Head: Atraumatic, normal size  Eyes: PEERLA, EOMI  Neck: Supple, no JVD Endocrine: No thryomegaly Cardiac: Normal S1, S2; RRR; 2/6 SEM Lungs: Clear to auscultation bilaterally, no wheezing, rhonchi or rales  Abd: Soft, nontender, no hepatomegaly  Ext: No edema, s/p L BKA Musculoskeletal: L BKA  Neuro: Alert and oriented to person, place, time, and situation, CNII-XII grossly intact, no focal deficits  Psych: Normal mood and affect   Labs  High Sensitivity Troponin:  No results for input(s): TROPONINIHS in the last 720 hours.   Cardiac EnzymesNo results for input(s): TROPONINI in the last 168 hours. No results for input(s): TROPIPOC in the last 168 hours.  Chemistry Recent Labs  Lab 09/26/19 1449  09/27/19 0301 10/02/19 0334 10/02/19 1618 10/03/19 0333  NA 129*   < > 134* 135 138  K 4.6   < > 4.8 4.3 4.2  CL 90*   < > 101 100 100  CO2 26   < > _0 GLUCOSE 194*   < > 176* 167* 148*  BUN 64*   < > _1 CREATININE 2.08*   < > 1.07 1.03 0.97  CALCIUM 9.4   < > 8.4* 8.6* 9.1  PROT 6.9  --   --   --   --   ALBUMIN 2.2*  --   --   --   --   AST 53*  --   --   --   --   ALT 71*  --   --   --   --   ALKPHOS 198*  --   --   --   --   BILITOT 0.8  --   --   --   --   GFRNONAA 32*   < > >60 >60 >60  GFRAA 37*   < > >60 >60 >60  ANIONGAP 13   < > _2 < > = values in this interval not displayed.    Hematology Recent Labs  Lab 10/01/19 0430 10/02/19 0334 10/03/19 0333  WBC 11.0* 10.0 10.0  RBC 3.09* 3.23* 3.49*  HGB 9.1* 9.5* 10.3*  HCT 28.5* 30.5* 32.3*  MCV 92.2 94.4 92.6  MCH 29.4 29.4 29.5  MCHC 31.9 31.1 31.9  RDW 13.2 13.1 13.2  PLT 498* 522* 514*   BNP Recent Labs  Lab 09/27/19 1329  BNP 195.1*    DDimer  Recent Labs  Lab 09/26/19 2135  DDIMER 3.25*     Radiology  DG Chest 2 View  Result Date: 10/02/2019 CLINICAL DATA:  Shortness of breath, postop left below-the-knee amputation. EXAM: CHEST - 2 VIEW COMPARISON:  CT chest 09/28/2019 and chest radiograph 09/26/2019. FINDINGS: Trachea is midline. Thoracic aorta is calcified. Heart size stable. Increasing pleuroparenchymal opacification in the mid and lower left hemithorax. Minimal right basilar atelectasis. There may be mild interstitial prominence bilaterally. IMPRESSION: 1. Increasing pleuroparenchymal opacification in the mid and lower left hemithorax may represent atelectasis or pneumonia superimposed on scarring. 2. Suspect mild interstitial pulmonary edema. 3. Mild right basilar atelectasis. 4.  Aortic atherosclerosis (ICD10-I70.0). Electronically Signed   By: Lorin Picket M.D.   On: 10/02/2019 11:40   CARDIAC CATHETERIZATION  Result  Date: 10/03/2019  Prox RCA lesion is 100% stenosed.   Rodney [name] is a 69 y.o. male  [id number] LOCATION:  FACILITY: [facility] PHYSICIAN: Rodney [name], M.D. [birth year] DATE OF PROCEDURE:  10/03/2019 DATE OF DISCHARGE: CARDIAC CATHETERIZATION History obtained from chart review.69 y.o. male with no documented hx but now with acute cellulitis of Lt foot, tachycardia, dyspnea, DM, AKI HTN, tobacco use, alcohol use and no hx of CADwho is being seen today for the evaluation of of new cardiomyopathy, CHF, and admit with sepsis, post amputation.   Mr. Chrisley has mild nonobstructive disease in his left system including the AV groove circumflex and LAD with a chronically occluded RCA, faint grade 1 left to right collaterals and a 30 mm peak to peak gradient across his aortic valve.  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  His LVEDP was 35.  At this point, I would recommend medical therapy.  The sheath was removed and a TR band was placed on the right wrist to achieve patent hemostasis.  Patient left lab in stable condition.  I have communicated these results to Dr. Audie Box . Rodney [name]. MD, [facility] 10/03/2019 8:14 AM    Cardiac Studies  TTE 09/28/2019 1. Assessment of his LV function is difficult. Consider repeat echo with  definity contrast once HR is slower . Marland Kitchen Left ventricular ejection  fraction, by estimation, is 35 to 40%. The left ventricle has moderately  decreased function. The left ventricle  demonstrates global hypokinesis. Left ventricular diastolic parameters are  indeterminate.  2. Right ventricular systolic function is mildly reduced. The right  ventricular size is normal.  3. Left atrial size was mildly dilated.  4. Right atrial size was mildly dilated.  5. The mitral valve is normal in structure. Mild mitral valve  regurgitation. No evidence of mitral stenosis.  6. The aortic valve is not well visualized in the short axis view but is  likely a 3 leaflet valve .Marland Kitchen The aortic valve is tricuspid. Aortic valve  regurgitation is not  visualized. Moderate aortic valve stenosis.   LHC 10/03/2019 CTO RCA Minimal CAD in LAD/LCX LVEDP 35 mmHG  Patient Profile  Rodney [name] is a 69 y.o. male with DM, HTN admitted 09/26/2019 for sepsis 2/2 RLE cellulitis. 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 Course c/b new onset systolic HF and Afib.   Assessment & Plan   1. Acute Systolic HF, EF 93-79% -LHC today with CTO of RCA and patent LAD/LCX. Suspect this is a mixed etiology of CAD and non-ischemic etiology  -moderate AS -LVEDP 35 mmHG; continue lasix 80 mg BID -continue metoprolol for Afib control -continue losartan 25 mg QD and added aldactone 12.5 mg QD  2. New onset Afib -rhythm appears to be afib; ekg pending this am -continue metoprolol 25 mg Q6 hours -we will plan to start eliquis 10 mg this evening and 5 mg BID starting tomorrow -plan for TEE/DCCV in AM; npo at midnight -this could be postop afib so will consider short course of amiodarone   3. CAD -CTO RCA -minimal LAD/LCX disease -continue ASA 81 mg QD and lipitor 80 mg QHS  4. Moderate AS -MG 25 mmHG. Follow as outpatient   For questions or updates, please contact [place] Please consult [email or web address] for contact info under   Time Spent with Patient: I have spent a total of 25 minutes with patient reviewing hospital notes, telemetry, EKGs, labs and examining the patient as well as establishing an assessment and plan that was discussed  with the patient.  > 50% of time was spent in direct patient care.    Signed, Addison Naegeli. Audie Box, [place]  10/03/2019 10:45 AM

## 2019-10-03 NOTE — Plan of Care (Signed)
Continue to monitor

## 2019-10-03 NOTE — Progress Notes (Signed)
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 Also with newly diagnosed systolic heart failure, LVEF 35-40% and at least moderate aortic stenosis. PMH includes HTN, HLD, DM.   OT comments  Patient continues to make steady progress towards goals in skilled OT session. Patient's session encompassed dressing EOB as pt was self-limiting in therapy as he stated "The doctor said I couldn't do anything today with my R hand because of the procedure". Therapist educated pt on importance of mobility, and told pt they would not be using the RW to adhere to doctors orders to which pt threw himself upright in the bed, and stated "Im doing this so you'll leave me alone". Pt requiring mod A to complete LB dressing, often ignoring therapists cues and wanting therapist to complete for him. Pt then completed bed mobility of rolling to reorganize lines, however pt refused to attempt any bedside ADLs. Pt would greatly benefit from the structure of CIR and demonstrates the physical ability to complete, however therapist is unsure if pt is intrinsically motivated to participate. Will continue to encourage pt and follow acutely.    Follow Up Recommendations  CIR    Equipment Recommendations  None recommended by OT    Recommendations for Other Services      Precautions / Restrictions Precautions Precautions: Fall Precaution Comments: wound vac, watch HR Required Braces or Orthoses: Other Brace Other Brace: limb guard Restrictions Weight Bearing Restrictions: Yes LLE Weight Bearing: Non weight bearing       Mobility Bed Mobility Overal bed mobility: Needs Assistance Bed Mobility: Supine to Sit;Sit to Supine     Supine to sit: Min guard Sit to supine: Min guard   General  bed mobility comments: Threw himself upright in bed "Im doing this so you'll leave me alone", looking at therapist stating "arent you going to let the rail down"  Transfers                      Balance Overall balance assessment: Needs assistance Sitting-balance support: Single extremity supported(Foot supported) Sitting balance-Leahy Scale: Good Sitting balance - Comments: Had to lean back a few times to don shorts, but then able to sit upright without assist                                   ADL either performed or assessed with clinical judgement   ADL Overall ADL's : Needs assistance/impaired                     Lower Body Dressing: Sitting/lateral leans;Cueing for safety;Cueing for compensatory techniques;Moderate assistance Lower Body Dressing Details (indicate cue type and reason): Donning shorts             Functional mobility during ADLs: Moderate assistance General ADL Comments: Pt extremely frustrated to participate in therapy as he stated the doctor told him not to do anything today due to cath, educated pt that as long as he didnt bare a lot of weight through his RUE we could sit work, pt willing to put on a pair of shorts but stated "Im only doing this so youll leave alone"     Vision       Perception  Praxis      Cognition Arousal/Alertness: Awake/alert Behavior During Therapy: Agitated;WFL for tasks assessed/performed Overall Cognitive Status: Within Functional Limits for tasks assessed                                 General Comments: Frustrated with therapist's presence depiste forewarning and education as to why therapist was coming        Exercises     Shoulder Instructions       General Comments      Pertinent Vitals/ Pain       Pain Assessment: Faces Faces Pain Scale: No hurt  Home Living                                          Prior Functioning/Environment               Frequency  Min 2X/week        Progress Toward Goals  OT Goals(current goals can now be found in the care plan section)  Progress towards OT goals: Progressing toward goals  Acute Rehab OT Goals Patient Stated Goal: for you to leave me alone OT Goal Formulation: Patient unable to participate in goal setting Time For Goal Achievement: 10/07/19 Potential to Achieve Goals: Good  Plan Discharge plan remains appropriate    Co-evaluation                 AM-PAC OT "6 Clicks" Daily Activity     Outcome Measure   Help from another person eating meals?: None Help from another person taking care of personal grooming?: None Help from another person toileting, which includes using toliet, bedpan, or urinal?: A Lot Help from another person bathing (including washing, rinsing, drying)?: A Lot Help from another person to put on and taking off regular upper body clothing?: A Little Help from another person to put on and taking off regular lower body clothing?: A Lot 6 Click Score: 17    End of Session    OT Visit Diagnosis: Unsteadiness on feet (R26.81);Muscle weakness (generalized) (M62.81);Pain;Other abnormalities of gait and mobility (R26.89) Pain - Right/Left: Left Pain - part of body: Leg   Activity Tolerance Treatment limited secondary to agitation   Patient Left in bed;with call bell/phone within reach   Nurse Communication Mobility status(Increased agitation when participating)        Time: 5053-9767 OT Time Calculation (min): 10 min  Charges: OT General Charges $OT Visit: 1 Visit OT Treatments $Self Care/Home Management : 8-22 mins  Corinne Ports E. Semaja Lymon, COTA/L Acute Rehabilitation Services [place] 10/03/2019, 2:19 PM

## 2019-10-03 NOTE — Progress Notes (Signed)
Family Medicine Teaching Service Daily Progress Note Intern Pager: [phone]  Patient name: Rodney [name] Medical record number: [id number] Date of birth: 1950/06/29 Age: 69 y.o. Gender: male  Primary Care Provider: [name], MD Consultants: Orthopedics Code Status: Full code  Pt Overview and Major Events to Date:   Assessment and Plan: EVO ADERMAN is a 69 y.o. male presenting with left foot cellulitis/ulceration. PMH is significant for hypertension, hyperlipidemia, tobacco abuse, type 2 diabetes, dyspnea, peripheral edema.  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  WBCs stable today at 10 from 10 yesterday, on admission was 17.  Remains afebrile, vitals stabilizing. -Orthopedics consulted, appreciate recommendations -Zosyn ended today -Patient started on cefdinir 300 mg twice daily for today and tomorrow -PT/OT eval and treat - recommend CIR versus SNF for d/c -CIR consulted and recommend admission for inpatient rehabilitation pending clear disposition plan -Vitals per routine -Morning CBCs -Tylenol and oxycodone as needed for pain  Acute Hypoxic respiratory failure secondary to pneumonia and new onset heart failure with reduced EF (acute). Worsening symptoms this AM--likely combination of new atrial fibrillation and HF.  -We will complete antibiotic regiment today -Continue diuresis with 80 mg IV Lasix - Appreciate Cardiology input and care-- LE arterial studies ordered  Acute heart failure in the setting of new diagnosis of HFrEF, stable Patient had echo 6/3 showing LVEF 35-40%. RV systolic function is mildly reduced, both atria mildly dilated.  Left heart cath showed 100% stenosis of proximal RCA -Cardiology consulted, appreciate recommendations -Continue 80 mg IV Lasix twice daily -Catheterization completed today -Continue aspirin - Metoprolol 25 mg q6h--will need to transition to XL -Add  Aldactone 12.5 mg daily -Started on Cozaar 25 mg daily -Continue smoking cessation counseling -Strict I's and O's  New onset Atrial Fibrillation  CHADS2VASC is 5.  EKG on 6/6 significant for a fib with RVR. Denies any chest pain of SOB.  Cardiology consulted, appreciate recs.  Patient had EKG performed this morning showing atrial fibrillation Likely cause of increased dyspnea and symptoms this AM.  -Cardiology consulted, appreciate recommendations -Lasix 80 mg twice daily -Metoprolol 25 mg q6h for rate control -Heart cath today -Repeat EKG showed A. fib -Eliquis 10 mg started tonight and 5 mg twice daily starting tomorrow -TEE planned for tomorrow 6/9 -N.p.o. at midnight   T2DM with neuropathy  PVD, stable Hemoglobin A1c 09/21/2019 was 8.7.  CBG this a.m. 208, stable.  Medications include Novolin 70/30 75 units in the morning and 55 units in the evening, Metformin 1000 mg twice daily -Hold patient's home insulin, Metformin -Lantus 20 units nightly -Moderate SSI -CBG monitoring -Vascular Arterial Duplex of LE ordered to evaluate PVD  Hyponatremia, 132 6/5.  Most likely due to hypervolemia, improving with diuresis.  -Monitor with diuresis.   Anemia, in part IDA with low iron saturations, ferritin, likely multifactorial.  Secondary to acute blood loss from surgery and underlying chronic disease. Normocytic decreased iron at 23 with normal TIBC and decreased saturation radiates.  Vitamin B-12 levels are elevated at 928.  Reticulocyte panel shows increased immature reticulocyte fraction. -Feraheme given 6/6.   AKI, improving Cr 2.08 on admission, BL ~1.1, today 1.07. -Morning BMPs -KVO IVF -Avoid nephrotoxic agents -Holding lisinopril-HCTZ combo -Giving Lasix 80 mg IV twice daily -Initiating losartan 25 mg daily  History of alcohol use disorder Patient declined alcohol use on admission; however, patient's PCP reported otherwise.  CIWA is overnight 0, no Ativan on  board. -Monitor CIWA's -Call for Ativan if CIWA's >8.  Peripheral edema, resolved Medications include furosemide 40 mg daily -Giving Lasix 40 mg IV given new HFrEF -Strict I's and O's  Hyperlipidemia, chronic, stable Home medication includes atorvastatin 40 mg daily. Patient's 10-year ASCVD risk 41.1%. -Continue home atorvastatin 40 mg qHS  Hypertension, well controlled Home medication includes Lisinopril-HCTZ 20-25 mg daily.  Blood pressures stable. -Holding lisinopril-HCTZ at this time in the setting of hypotension and AKI -Continue Lasix  History of tobacco abuse Patient reportedly recently quit smoking -Recommend continued cessation  FEN/GI: Carb modified diet, n.p.o. at midnight PPx: Heparin gtt   Disposition: LHC on 6/8   Subjective:  Patient reports pain is well improved today.  Shortness of breath is also improved.  Right heart cath this morning which she reports went well.  Objective: Temp:  [98 F (36.7 C)-98.5 F (36.9 C)] 98.3 F (36.8 C) (06/08 0503) Pulse Rate:  [62-108] 105 (06/08 0503) Resp:  [17-24] 17 (06/08 0532) BP: (111-132)/(57-85) 132/85 (06/08 0503) SpO2:  [91 %-100 %] 96 % (06/08 0532) Weight:  [108.9 kg] 108.9 kg (06/08 0500) Physical Exam: General: Laying in bed comfortably, no acute distress Cardiovascular: Patient's heart rate is in the 80s on evaluation, difficult to tell if it is a regular rhythm or not from the wall monitor.  EKG is ordered Respiratory: Normal work of breathing, occasional inspiratory wheeze.  On 2 L nasal cannula Abdomen: Soft, nontender, positive bowel sounds Extremities: Right lower extremity with 1+ pitting edema. Patient is s/p left BKA, bandages intact with wound VAC in place, no drainage  Laboratory: Recent Labs  Lab 10/01/19 0430 10/02/19 0334 10/03/19 0333  WBC 11.0* 10.0 10.0  HGB 9.1* 9.5* 10.3*  HCT 28.5* 30.5* 32.3*  PLT 498* 522* 514*   Recent Labs  Lab 09/26/19 1449 09/27/19 0301  10/02/19 0334 10/02/19 1618 10/03/19 0333  NA 129*   < > 134* 135 138  K 4.6   < > 4.8 4.3 4.2  CL 90*   < > 101 100 100  CO2 26   < > 27 28 29   BUN 64*   < > 19 16 17   CREATININE 2.08*   < > 1.07 1.03 0.97  CALCIUM 9.4   < > 8.4* 8.6* 9.1  PROT 6.9  --   --   --   --   BILITOT 0.8  --   --   --   --   ALKPHOS 198*  --   --   --   --   ALT 71*  --   --   --   --   AST 53*  --   --   --   --   GLUCOSE 194*   < > 176* 167* 148*   < > = values in this interval not displayed.   Imaging/Diagnostic Tests: DG Chest 2 View  Result Date: 10/02/2019 CLINICAL DATA:  Shortness of breath, postop left below-the-knee amputation. EXAM: CHEST - 2 VIEW COMPARISON:  CT chest 09/28/2019 and chest radiograph 09/26/2019. FINDINGS: Trachea is midline. Thoracic aorta is calcified. Heart size stable. Increasing pleuroparenchymal opacification in the mid and lower left hemithorax. Minimal right basilar atelectasis. There may be mild interstitial prominence bilaterally. IMPRESSION: 1. Increasing pleuroparenchymal opacification in the mid and lower left hemithorax may represent atelectasis or pneumonia superimposed on scarring. 2. Suspect mild interstitial pulmonary edema. 3. Mild right basilar atelectasis. 4.  Aortic atherosclerosis (ICD10-I70.0). Electronically Signed   By: Rip Harbour  Blietz M.D.   On: 10/02/2019 11:40     Gifford Shave, MD 10/03/2019, 5:49 AM PGY-1, [place] Intern pager: [phone], text pages welcome

## 2019-10-03 NOTE — Progress Notes (Signed)
Patient arrived back to room from cath lab. Right radial site clean dry and intact. Telemetry applied. V/s done and assessment complete. Will continue to monitor.   Emelda Fear, RN

## 2019-10-03 NOTE — Progress Notes (Signed)
Reinforced wound vac dressing. Vac maintaining good seal. Continuous at 125. Will continue to monitor

## 2019-10-03 NOTE — Progress Notes (Signed)
Inpatient Rehabilitation-Admissions Coordinator   Received call from pt's insurance company for request for peer to peer prior to making a determination. Per attending service, Dr. Matilde Haymaker has agreed to complete the peer to peer. I have set up the conference for today. Will update once there has been a determination.   Raechel Ache, OTR/L  Rehab Admissions Coordinator  [phone] 10/03/2019 11:51 AM

## 2019-10-03 NOTE — Progress Notes (Signed)
Patient did not finish his treatment.  Patient complained about the taste of the medication towards the end.  Stated that it was "dirty, nasty."  No distress noted, patient continued to wear his [place].

## 2019-10-03 NOTE — Progress Notes (Signed)
Went to evaluate patient start of shift as he had his cath earlier today and is on anticoagulation.  Patient's wrist with clean, dry bandage present.  No signs of bleeding.  Patient in no distress, resting comfortably.  Patient able to move all digits without issue he denies pain in his wrist or hand.  Discussed with patient to be sure and let us know if he sees any bleeding from his wrist.

## 2019-10-03 NOTE — Progress Notes (Addendum)
Paged Kathlen Mody, PA at this time to confirm Eliquis dose this PM.   Per PA, 5 mg eliquis to be given now.

## 2019-10-04 DIAGNOSIS — Z89512 Acquired absence of left leg below knee: Secondary | ICD-10-CM

## 2019-10-04 LAB — GLUCOSE, CAPILLARY
Glucose-Capillary: 182 mg/dL — ABNORMAL HIGH (ref 70–99)
Glucose-Capillary: 177 mg/dL — ABNORMAL HIGH (ref 70–99)
Glucose-Capillary: 184 mg/dL — ABNORMAL HIGH (ref 70–99)
Glucose-Capillary: 201 mg/dL — ABNORMAL HIGH (ref 70–99)

## 2019-10-04 LAB — CBC
HCT: 32 % — ABNORMAL LOW (ref 39.0–52.0)
Hemoglobin: 10.3 g/dL — ABNORMAL LOW (ref 13.0–17.0)
MCH: 29.7 pg (ref 26.0–34.0)
MCHC: 32.2 g/dL (ref 30.0–36.0)
MCV: 92.2 fL (ref 80.0–100.0)
Platelets: 513 10*3/uL — ABNORMAL HIGH (ref 150–400)
RBC: 3.47 MIL/uL — ABNORMAL LOW (ref 4.22–5.81)
RDW: 13.3 % (ref 11.5–15.5)
WBC: 10.3 10*3/uL (ref 4.0–10.5)
nRBC: 0 % (ref 0.0–0.2)

## 2019-10-04 LAB — BASIC METABOLIC PANEL
Anion gap: 9 (ref 5–15)
BUN: 21 mg/dL (ref 8–23)
CO2: 29 mmol/L (ref 22–32)
Calcium: 9 mg/dL (ref 8.9–10.3)
Chloride: 98 mmol/L (ref 98–111)
Creatinine, Ser: 1.01 mg/dL (ref 0.61–1.24)
GFR calc Af Amer: >60 mL/min (ref >60)
GFR calc non Af Amer: >60 mL/min (ref >60)
Glucose, Bld: 163 mg/dL — ABNORMAL HIGH (ref 70–99)
Potassium: 4.1 mmol/L (ref 3.5–5.1)
Sodium: 136 mmol/L (ref 135–145)

## 2019-10-04 MED ORDER — POLYETHYLENE GLYCOL 3350 17 G PO PACK
17.0000 g | PACK | Freq: Every day | ORAL | Status: DC
  Filled 2019-10-04: qty 1

## 2019-10-04 MED ORDER — OXYCODONE HCL 5 MG PO TABS
5.0000 mg | ORAL_TABLET | ORAL | Status: DC | PRN
  Filled 2019-10-04: qty 1

## 2019-10-04 MED ORDER — SODIUM CHLORIDE 0.9 % IV SOLN: INTRAVENOUS | Status: DC

## 2019-10-04 NOTE — Plan of Care (Signed)
  Problem: Clinical Measurements: Goal: Will remain free from infection Outcome: Progressing Goal: Diagnostic test results will improve Outcome: Progressing   

## 2019-10-04 NOTE — Progress Notes (Signed)
Transitions of Care Pharmacist Note  Rodney [name] is a 69 y.o. male that has been diagnosed with A Fib and will be prescribed Eliquis (apixaban) at discharge.   Patient Education: I provided the following education on apixaban 10/04/19 to the patient: How to take the medication Described what the medication is Signs of bleeding Signs/symptoms of VTE and stroke  Answered their questions  Discharge Medications Plan: The patient wants to have their discharge medications filled by the Transitions of Care pharmacy rather than their usual pharmacy.  The discharge orders pharmacy has been changed to the Transitions of Care pharmacy, the patient will receive a phone call regarding co-pay, and their medications will be delivered by the Transitions of Care pharmacy.    Thank you,   Sherren Kerns, PharmD PGY1 Acute Care Pharmacy Resident October 04, 2019

## 2019-10-04 NOTE — Progress Notes (Signed)
Family Medicine Teaching Service Daily Progress Note Intern Pager: [phone]  Patient name: Rodney [name] Medical record number: [id number] Date of birth: July 07, 1950 Age: 69 y.o. Gender: male  Primary Care Provider: [name], MD Consultants: Orthopedics Code Status: Full code  Pt Overview and Major Events to Date:   Assessment and Plan: Rodney [name] is a 69 y.o. male presenting with left foot cellulitis/ulceration. PMH is significant for hypertension, hyperlipidemia, tobacco abuse, type 2 diabetes, dyspnea, peripheral edema.  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  WBCs stable today at 10.3 from 10 yesterday, on admission was 17.  Remains afebrile, vitals stabilizing. -Orthopedics consulted, appreciate recommendations -Patient has completed antibiotic regimen -PT/OT eval and treat - recommend CIR versus SNF for d/c -CIR consulted and recommend admission for inpatient rehabilitation pending clear disposition plan -Vitals per routine -Morning CBCs -Tylenol and oxycodone as needed for pain, decreasing oxycodone dose  Acute Hypoxic respiratory failure secondary to pneumonia and new onset heart failure with reduced EF (acute).  Symptoms improving at this time.  We will wean O2 as possible -Antibiotic regimen completed -Continue diuresis with 80 mg IV Lasix -Cardiology following, appreciate their recommendations -Wean O2   Acute heart failure in the setting of new diagnosis of HFrEF, stable Patient had echo 6/3 showing LVEF 35-40%. RV systolic function is mildly reduced, both atria mildly dilated.  Left heart cath showed 100% stenosis of proximal RCA -Cardiology consulted, appreciate recommendations -Continue 80 mg IV Lasix twice daily -Continue aspirin -Patient is on metoprolol 25 mg every 6 hours-will need transition to metoprolol XL -Losartan 25 mg daily -Continue Aldactone 12.5 mg  daily -Continue smoking cessation counseling -Strict I's and O's  New onset Atrial Fibrillation  CHADS2VASC is 5.  EKG on 6/6 significant for a fib with RVR. Denies any chest pain of SOB.  Cardiology consulted, appreciate recs.  Patient had EKG performed this morning showing atrial fibrillation Likely cause of increased dyspnea and symptoms this AM.  -Cardiology consulted, appreciate recommendations -Lasix 80 mg twice daily -Metoprolol 25 mg q6h for rate control -TEE/DCCV scheduled for 6/10 -Repeat EKG showed A. fib -Eliquis 5 mg twice daily -N.p.o. at midnight    T2DM with neuropathy  PVD, stable Hemoglobin A1c 09/21/2019 was 8.7.  CBG this a.m. 168.  Stable.  Medications include Novolin 70/30 75 units in the morning and 55 units in the evening, Metformin 1000 mg twice daily -Hold patient's home insulin, Metformin -Lantus 20 units prior to TEE, plan to increase to 30 units nightly after -Moderate SSI -CBG monitoring -Vascular Arterial Duplex of LE ordered to evaluate PVD  Hyponatremia, 132 6/5.  Most likely due to hypervolemia, improving with diuresis.  -Monitor with diuresis.   Anemia, in part IDA with low iron saturations, ferritin, likely multifactorial.  Secondary to acute blood loss from surgery and underlying chronic disease. Normocytic decreased iron at 23 with normal TIBC and decreased saturation radiates.  Vitamin B-12 levels are elevated at 928.  Reticulocyte panel shows increased immature reticulocyte fraction. -Feraheme given 6/6.   AKI, improving Cr 2.08 on admission, BL ~1.1, today 1.07. -Morning BMPs -KVO IVF -Avoid nephrotoxic agents -Holding lisinopril-HCTZ combo -Giving Lasix 80 mg IV twice daily -Initiating losartan 25 mg daily  History of alcohol use disorder Patient declined alcohol use on admission; however, patient's PCP reported otherwise.  CIWA is overnight 0, no Ativan on board. -Monitor CIWA's -Call for Ativan if CIWA's >8.  Peripheral  edema, resolved Medications include furosemide 40 mg daily -Giving Lasix 40 mg IV given new HFrEF -Strict I's and O's  Hyperlipidemia, chronic, stable Home medication includes atorvastatin 40 mg daily. Patient's 10-year ASCVD risk 41.1%. -Continue home atorvastatin 40 mg qHS  Hypertension, well controlled Home medication includes Lisinopril-HCTZ 20-25 mg daily.  Blood pressures stable. -Holding lisinopril-HCTZ at this time in the setting of hypotension and AKI -Continue Lasix  History of tobacco abuse Patient reportedly recently quit smoking -Recommend continued cessation  FEN/GI: Carb modified diet, n.p.o. at midnight PPx: Heparin gtt   Disposition: [facility] on 6/8   Subjective:  Patient reports he is doing well this morning.  Denies any shortness of breath or chest pain.  Feels like his breathing has improved.  Objective: Temp:  [97.9 F (36.6 C)-98.9 F (37.2 C)] 98.9 F (37.2 C) (06/09 1253) Pulse Rate:  [71-128] 86 (06/09 1253) Resp:  [16-26] 16 (06/09 1253) BP: (106-129)/(68-87) 115/68 (06/09 1253) SpO2:  [90 %-99 %] 94 % (06/09 1253) Weight:  [107.8 kg] 107.8 kg (06/09 0509) Physical Exam: General: Lying in bed resting when I enter the room Cardiovascular: Heart rate is normal rate, difficult to determine if patient is still in A. fib from wall monitor. Respiratory: Normal work of breathing, occasional wheezes, on 2 L nasal cannula when I enter the room Extremities: Right lower extremity edema resolved.. Patient is s/p left BKA, bandages intact with wound VAC in place, no drainage  Laboratory: Recent Labs  Lab 10/02/19 0334 10/03/19 0333 10/04/19 0308  WBC 10.0 10.0 10.3  HGB 9.5* 10.3* 10.3*  HCT 30.5* 32.3* 32.0*  PLT 522* 514* 513*   Recent Labs  Lab 10/02/19 1618 10/03/19 0333 10/04/19 0308  NA 135 138 136  K 4.3 4.2 4.1  CL 100 100 98  CO2 28 29 29   BUN 16 17 21   CREATININE 1.03 0.97 1.01  CALCIUM 8.6* 9.1 9.0  GLUCOSE 167* 148* 163*    Imaging/Diagnostic Tests: No results found.   Gifford Shave, MD 10/04/2019, 2:35 PM PGY-1, [place] Intern pager: [phone], text pages welcome

## 2019-10-04 NOTE — H&P (View-Only) (Signed)
Progress Note  Patient Name: Rodney [name] Date of Encounter: 10/04/2019  [facility] Cardiologist: Rodney Casino, MD   Subjective   Patient put out 1.8L urine overnight. In rate controlled afib. Plan for TEE/DCCV tomorrow. No CP or SOB.   Inpatient Medications    Scheduled Meds: . acetaminophen  650 mg Oral Q6H  . apixaban  5 mg Oral BID  . aspirin  81 mg Oral Daily  . atorvastatin  80 mg Oral Daily  . docusate sodium  100 mg Oral BID  . feeding supplement (ENSURE ENLIVE)  237 mL Oral TID BM  . feeding supplement (PRO-STAT SUGAR FREE 64)  30 mL Oral TID  . furosemide  80 mg Intravenous BID  . insulin aspart  0-15 Units Subcutaneous TID WC  . insulin aspart  0-5 Units Subcutaneous QHS  . insulin glargine  20 Units Subcutaneous QHS  . ipratropium-albuterol  3 mL Nebulization BID  . losartan  25 mg Oral Daily  . metoprolol tartrate  25 mg Oral Q6H  . multivitamin with minerals  1 tablet Oral Daily  . ramelteon  8 mg Oral QHS  . spironolactone  12.5 mg Oral Daily   Continuous Infusions: . sodium chloride Stopped (09/28/19 1359)   PRN Meds: bisacodyl, magnesium citrate, metoCLOPramide **OR** metoCLOPramide (REGLAN) injection, ondansetron **OR** ondansetron (ZOFRAN) IV, oxyCODONE, oxyCODONE, polyethylene glycol   Vital Signs    Vitals:   10/04/19 0509 10/04/19 0511 10/04/19 0801 10/04/19 0907  BP:    129/79  Pulse:   94 95  Resp: (!) 26 17 (!) 22 20  Temp:    98.5 F (36.9 C)  TempSrc:    Oral  SpO2:  98% 99% 96%  Weight: 107.8 kg     Height:        Intake/Output Summary (Last 24 hours) at 10/04/2019 0912 Last data filed at 10/04/2019 0500 Gross per 24 hour  Intake 414.22 ml  Output 1800 ml  Net -1385.78 ml   Last 3 Weights 10/04/2019 10/03/2019 10/02/2019  Weight (lbs) 237 lb 11.2 oz 240 lb 1.3 oz 253 lb 4.9 oz  Weight (kg) 107.82 kg 108.9 kg 114.9 kg      Telemetry    Afib, HR 90-100, PVCs, 4b NSVT - Personally Reviewed  ECG     Afib, 91 bpm, PVCs-  Personally Reviewed  Physical Exam   GEN: No acute distress.   Neck: No JVD Cardiac: Irreg Irreg, + murmurs, no rubs, or gallops.  Respiratory: Clear to auscultation bilaterally. GI: Soft, nontender, non-distended  MS: No edema; No deformity. Neuro:  Nonfocal  Psych: Normal affect   Labs    High Sensitivity Troponin:  No results for input(s): TROPONINIHS in the last 720 hours.    Chemistry Recent Labs  Lab 10/02/19 1618 10/03/19 0333 10/04/19 0308  NA 135 138 136  K 4.3 4.2 4.1  CL 100 100 98  CO2 _0 GLUCOSE 167* 148* 163*  BUN _1 CREATININE 1.03 0.97 1.01  CALCIUM 8.6* 9.1 9.0  GFRNONAA >60 >60 >60  GFRAA >60 >60 >60  ANIONGAP _2 Hematology Recent Labs  Lab 10/02/19 0334 10/03/19 0333 10/04/19 0308  WBC 10.0 10.0 10.3  RBC 3.23* 3.49* 3.47*  HGB 9.5* 10.3* 10.3*  HCT 30.5* 32.3* 32.0*  MCV 94.4 92.6 92.2  MCH 29.4 29.5 29.7  MCHC 31.1 31.9 32.2  RDW 13.1 13.2 13.3  PLT 522* 514* 513*  BNP Recent Labs  Lab 09/27/19 1329  BNP 195.1*     DDimer No results for input(s): DDIMER in the last 168 hours.   Radiology    DG Chest 2 View  Result Date: 10/02/2019 CLINICAL DATA:  Shortness of breath, postop left below-the-knee amputation. EXAM: CHEST - 2 VIEW COMPARISON:  CT chest 09/28/2019 and chest radiograph 09/26/2019. FINDINGS: Trachea is midline. Thoracic aorta is calcified. Heart size stable. Increasing pleuroparenchymal opacification in the mid and lower left hemithorax. Minimal right basilar atelectasis. There may be mild interstitial prominence bilaterally. IMPRESSION: 1. Increasing pleuroparenchymal opacification in the mid and lower left hemithorax may represent atelectasis or pneumonia superimposed on scarring. 2. Suspect mild interstitial pulmonary edema. 3. Mild right basilar atelectasis. 4.  Aortic atherosclerosis (ICD10-I70.0). Electronically Signed   By: Rodney [name] M.D.   On: 10/02/2019 11:40   CARDIAC  CATHETERIZATION  Result Date: 10/03/2019  Prox RCA lesion is 100% stenosed.  Rodney [name] is a 69 y.o. male  [id number] LOCATION:  FACILITY: [facility] PHYSICIAN: Rodney [name], M.D. [birth year] DATE OF PROCEDURE:  10/03/2019 DATE OF DISCHARGE: CARDIAC CATHETERIZATION History obtained from chart review.69 y.o. male with no documented hx but now with acute cellulitis of Lt foot, tachycardia, dyspnea, DM, AKI HTN, tobacco use, alcohol use and no hx of CADwho is being seen today for the evaluation of of new cardiomyopathy, CHF, and admit with sepsis, post amputation.   Rodney [name] has mild nonobstructive disease in his left system including the AV groove circumflex and LAD with a chronically occluded RCA, faint grade 1 left to right collaterals and a 30 mm peak to peak gradient across his aortic valve.  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  His LVEDP was 35.  At this point, I would recommend medical therapy.  The sheath was removed and a TR band was placed on the right wrist to achieve patent hemostasis.  Patient left lab in stable condition.  I have communicated these results to Dr. Audie [name] . Rodney [name]. MD, [facility] 10/03/2019 8:14 AM    Cardiac Studies   TTE 09/28/2019 1. Assessment of his LV function is difficult. Consider repeat echo with  definity contrast once HR is slower . Marland Kitchen Left ventricular ejection  fraction, by estimation, is 35 to 40%. The left ventricle has moderately  decreased function. The left ventricle  demonstrates global hypokinesis. Left ventricular diastolic parameters are  indeterminate.  2. Right ventricular systolic function is mildly reduced. The right  ventricular size is normal.  3. Left atrial size was mildly dilated.  4. Right atrial size was mildly dilated.  5. The mitral valve is normal in structure. Mild mitral valve  regurgitation. No evidence of mitral stenosis.  6. The aortic valve is not well visualized in the short axis view but is  likely a 3  leaflet valve .Marland Kitchen The aortic valve is tricuspid. Aortic valve  regurgitation is not visualized. Moderate aortic valve stenosis.   LHC 10/03/2019 CTO RCA Minimal CAD in LAD/LCX LVEDP 35 mmHG  Patient Profile     69 y.o. male with DM, HTN admitted 09/26/2019 for sepsis 2/2 RLE cellulitis. 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 Course c/b new onset systolic HF and Afib.  Assessment & Plan    Acute systolic CHF, EF 46-56% - LCH with CTO of RCA with mild non obstructive disease in Lcx and LAD and moderate AS - LVEDP measured at 50mHg - IV lasix 80 mg BID - metoprolol - Losartan 276mdaily - spironolactone  12.26m daily added - Urine output ivernight 1.8L, net -3.3 - weight 257>237lbs - creatinine stable  New onset Afib - Rates controlled on metoprolol 25 mg Q6H - Eliquis 536mBID started - Plan for TEE/DCCV tomorrow. NPO at midnight  CAD - cath as above - continue medical therapy with aspirin, statin  Moderate AS - mean gradient 2519m  For questions or updates, please contact [facility] Please consult [email or web address] for contact info under        Signed, Sona Nations H FNinfa MeekerA-C  10/04/2019, 9:12 AM

## 2019-10-04 NOTE — Progress Notes (Signed)
Consent form for TEE obtained and placed in pt's chart.   Lavenia Atlas, RN

## 2019-10-04 NOTE — Progress Notes (Signed)
 Progress Note  Patient Name: Rodney [name] Date of Encounter: 10/04/2019  [facility] Cardiologist: Kenneth C Hilty, MD   Subjective   Patient put out 1.8L urine overnight. In rate controlled afib. Plan for TEE/DCCV tomorrow. No CP or SOB.   Inpatient Medications    Scheduled Meds: . acetaminophen  650 mg Oral Q6H  . apixaban  5 mg Oral BID  . aspirin  81 mg Oral Daily  . atorvastatin  80 mg Oral Daily  . docusate sodium  100 mg Oral BID  . feeding supplement (ENSURE ENLIVE)  237 mL Oral TID BM  . feeding supplement (PRO-STAT SUGAR FREE 64)  30 mL Oral TID  . furosemide  80 mg Intravenous BID  . insulin aspart  0-15 Units Subcutaneous TID WC  . insulin aspart  0-5 Units Subcutaneous QHS  . insulin glargine  20 Units Subcutaneous QHS  . ipratropium-albuterol  3 mL Nebulization BID  . losartan  25 mg Oral Daily  . metoprolol tartrate  25 mg Oral Q6H  . multivitamin with minerals  1 tablet Oral Daily  . ramelteon  8 mg Oral QHS  . spironolactone  12.5 mg Oral Daily   Continuous Infusions: . sodium chloride Stopped (09/28/19 1359)   PRN Meds: bisacodyl, magnesium citrate, metoCLOPramide **OR** metoCLOPramide (REGLAN) injection, ondansetron **OR** ondansetron (ZOFRAN) IV, oxyCODONE, oxyCODONE, polyethylene glycol   Vital Signs    Vitals:   10/04/19 0509 10/04/19 0511 10/04/19 0801 10/04/19 0907  BP:    129/79  Pulse:   94 95  Resp: (!) 26 17 (!) 22 20  Temp:    98.5 F (36.9 C)  TempSrc:    Oral  SpO2:  98% 99% 96%  Weight: 107.8 kg     Height:        Intake/Output Summary (Last 24 hours) at 10/04/2019 0912 Last data filed at 10/04/2019 0500 Gross per 24 hour  Intake 414.22 ml  Output 1800 ml  Net -1385.78 ml   Last 3 Weights 10/04/2019 10/03/2019 10/02/2019  Weight (lbs) 237 lb 11.2 oz 240 lb 1.3 oz 253 lb 4.9 oz  Weight (kg) 107.82 kg 108.9 kg 114.9 kg      Telemetry    Afib, HR 90-100, PVCs, 4b NSVT - Personally Reviewed  ECG     Afib, 91 bpm, PVCs-  Personally Reviewed  Physical Exam   GEN: No acute distress.   Neck: No JVD Cardiac: Irreg Irreg, + murmurs, no rubs, or gallops.  Respiratory: Clear to auscultation bilaterally. GI: Soft, nontender, non-distended  MS: No edema; No deformity. Neuro:  Nonfocal  Psych: Normal affect   Labs    High Sensitivity Troponin:  No results for input(s): TROPONINIHS in the last 720 hours.    Chemistry Recent Labs  Lab 10/02/19 1618 10/03/19 0333 10/04/19 0308  NA 135 138 136  K 4.3 4.2 4.1  CL 100 100 98  CO2 28 29 29  GLUCOSE 167* 148* 163*  BUN 16 17 21  CREATININE 1.03 0.97 1.01  CALCIUM 8.6* 9.1 9.0  GFRNONAA >60 >60 >60  GFRAA >60 >60 >60  ANIONGAP 7 9 9     Hematology Recent Labs  Lab 10/02/19 0334 10/03/19 0333 10/04/19 0308  WBC 10.0 10.0 10.3  RBC 3.23* 3.49* 3.47*  HGB 9.5* 10.3* 10.3*  HCT 30.5* 32.3* 32.0*  MCV 94.4 92.6 92.2  MCH 29.4 29.5 29.7  MCHC 31.1 31.9 32.2  RDW 13.1 13.2 13.3  PLT 522* 514* 513*      BNP Recent Labs  Lab 09/27/19 1329  BNP 195.1*     DDimer No results for input(s): DDIMER in the last 168 hours.   Radiology    DG Chest 2 View  Result Date: 10/02/2019 CLINICAL DATA:  Shortness of breath, postop left below-the-knee amputation. EXAM: CHEST - 2 VIEW COMPARISON:  CT chest 09/28/2019 and chest radiograph 09/26/2019. FINDINGS: Trachea is midline. Thoracic aorta is calcified. Heart size stable. Increasing pleuroparenchymal opacification in the mid and lower left hemithorax. Minimal right basilar atelectasis. There may be mild interstitial prominence bilaterally. IMPRESSION: 1. Increasing pleuroparenchymal opacification in the mid and lower left hemithorax may represent atelectasis or pneumonia superimposed on scarring. 2. Suspect mild interstitial pulmonary edema. 3. Mild right basilar atelectasis. 4.  Aortic atherosclerosis (ICD10-I70.0). Electronically Signed   By: Melinda  Blietz M.D.   On: 10/02/2019 11:40   CARDIAC  CATHETERIZATION  Result Date: 10/03/2019  Prox RCA lesion is 100% stenosed.  Rodney [name] is a 68 y.o. male  [id number] LOCATION:  FACILITY: [facility] PHYSICIAN: Jonathan Berry, M.D. 01/03/1951 DATE OF PROCEDURE:  10/03/2019 DATE OF DISCHARGE: CARDIAC CATHETERIZATION History obtained from chart review.68 y.o. male with no documented hx but now with acute cellulitis of Lt foot, tachycardia, dyspnea, DM, AKI HTN, tobacco use, alcohol use and no hx of CADwho is being seen today for the evaluation of of new cardiomyopathy, CHF, and admit with sepsis, post amputation.   [name] has mild nonobstructive disease in his left system including the AV groove circumflex and LAD with a chronically occluded RCA, faint grade 1 left to right collaterals and a 30 mm peak to peak gradient across his aortic valve.  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  His LVEDP was 35.  At this point, I would recommend medical therapy.  The sheath was removed and a TR band was placed on the right wrist to achieve patent hemostasis.  Patient left lab in stable condition.  I have communicated these results to Dr. O'Neal . Jonathan Berry. MD, FACC 10/03/2019 8:14 AM    Cardiac Studies   TTE 09/28/2019 1. Assessment of his LV function is difficult. Consider repeat echo with  definity contrast once HR is slower . . Left ventricular ejection  fraction, by estimation, is 35 to 40%. The left ventricle has moderately  decreased function. The left ventricle  demonstrates global hypokinesis. Left ventricular diastolic parameters are  indeterminate.  2. Right ventricular systolic function is mildly reduced. The right  ventricular size is normal.  3. Left atrial size was mildly dilated.  4. Right atrial size was mildly dilated.  5. The mitral valve is normal in structure. Mild mitral valve  regurgitation. No evidence of mitral stenosis.  6. The aortic valve is not well visualized in the short axis view but is  likely a 3  leaflet valve .. The aortic valve is tricuspid. Aortic valve  regurgitation is not visualized. Moderate aortic valve stenosis.   LHC 10/03/2019 CTO RCA Minimal CAD in LAD/LCX LVEDP 35 mmHG  Patient Profile     68 y.o. male with DM, HTN admitted 09/26/2019 for sepsis 2/2 RLE cellulitis. 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 Course c/b new onset systolic HF and Afib.  Assessment & Plan    Acute systolic CHF, EF 35-40% - LCH with CTO of RCA with mild non obstructive disease in Lcx and LAD and moderate AS - LVEDP measured at 35mmHg - IV lasix 80 mg BID - metoprolol - Losartan 25mg daily - spironolactone   12.5mg daily added - Urine output ivernight 1.8L, net -3.3 - weight 257>237lbs - creatinine stable  New onset Afib - Rates controlled on metoprolol 25 mg Q6H - Eliquis 5mg BID started - Plan for TEE/DCCV tomorrow. NPO at midnight  CAD - cath as above - continue medical therapy with aspirin, statin  Moderate AS - mean gradient 25mmHg  For questions or updates, please contact [facility] Please consult [email or web address] for contact info under        Signed, Brennin Durfee H Kristine Tiley, PA-C  10/04/2019, 9:12 AM   

## 2019-10-04 NOTE — Progress Notes (Signed)
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 Also with newly diagnosed systolic heart failure, LVEF 35-40% and at least moderate aortic stenosis. PMH includes HTN, HLD, DM.    PT Comments    Witness pt completing std pvt with RW from [facility] to bed with RN tech requiring modA to power up from [facility]. Pt with poor walker management and is at increased risk of falling. Pt agreed to complete lateral scoot transfer to chair but wouldn't not do another stand pvt transfer due to "that was a work out. That was too hard." Pt requiring max verbal cues and minA for lateral scoot transfer for safety and drop arm chair placement. Pt participated in bilat LE strengthening exercises while sitting in chair. Pt with decreased activity tolerance and required max encouragement to complete 10 reps of exercises. Discussed expectations of CIR and he said "I'll do it when I get there, but I'm not counting rehab yet." Continue to recommend CIR upon d/c to achieve safe level of mod I w/c level function for safe transition home with significant other. Acute PT to cont to follow.    Follow Up Recommendations  CIR;Supervision for mobility/OOB     Equipment Recommendations  Wheelchair (measurements PT);Wheelchair cushion (measurements PT);Rolling walker with 5" wheels    Recommendations for Other Services       Precautions / Restrictions Precautions Precautions: Fall Precaution Comments: L residual limb wound vac Required Braces or Orthoses: Other Brace Other Brace: limb guard(pt defers use of it stating "i'm not planning on banging it") Restrictions Weight Bearing Restrictions: Yes LLE Weight Bearing: Non weight bearing    Mobility  Bed Mobility Overal bed mobility: Needs Assistance Bed  Mobility: Supine to Sit     Supine to sit: Min assist;Mod assist     General bed mobility comments: HOB slightly elevated, pt reached for PTs hand and stated "give me your hand" pt rocked forward and threw self up to EOB and pulled on PTs  UE to assist with trunk elevation  Transfers Overall transfer level: Needs assistance   Transfers: Stand Pivot Transfers;Lateral/Scoot Transfers   Stand pivot transfers: Min assist;From elevated surface      Lateral/Scoot Transfers: Min assist General transfer comment: PT walked into pt completeing std pvt transfer from [facility] to bed with RW. Pt requiring minA for safety as pt was impulsive and demo'd poor walker management. Pt stating 'that was a a workout." Pt agreed to complete lateral scoot transfer to drop arm recliner. Pt requiring minA for safety and max directional cues for hand placement, chair placement and to hold chair to prevent moving despite being locked. Pt used R LE to assist with lateral scoot as well  Ambulation/Gait             General Gait Details: pt declined attempting to 'hop" on R LE   Stairs             Wheelchair Mobility    Modified Rankin (Stroke Patients Only)       Balance Overall balance assessment: Needs assistance Sitting-balance support: No upper extremity supported;Feet supported Sitting balance-Leahy Scale: Good     Standing balance support: Bilateral upper extremity supported;During functional activity Standing balance-Leahy Scale: Zero Standing balance comment: dependent on RW  Cognition Arousal/Alertness: Awake/alert Behavior During Therapy: Agitated Overall Cognitive Status: Within Functional Limits for tasks assessed                                 General Comments: pt particular but agreed to therapy session even though he just got back to bed from [place] Exercises - Lower Extremity Long Arc Quad:  AROM;Both;10 reps;Seated(manual resistance provided to R LE) Heel Slides: AAROM;Both;10 reps;Sidelying(manual resistance provided to R LE for strengthening )    General Comments General comments (skin integrity, edema, etc.): Pt on supplemental O2 and reports "I need that nose thing" SpO2 >90% on 2Lo2 via [place]      Pertinent Vitals/Pain Pain Assessment: Faces Faces Pain Scale: No hurt    Home Living                      Prior Function            PT Goals (current goals can now be found in the care plan section) Progress towards PT goals: Progressing toward goals    Frequency    Min 3X/week      PT Plan Discharge plan needs to be updated    Co-evaluation              AM-PAC PT "6 Clicks" Mobility   Outcome Measure  Help needed turning from your back to your side while in a flat bed without using bedrails?: A Little Help needed moving from lying on your back to sitting on the side of a flat bed without using bedrails?: A Lot Help needed moving to and from a bed to a chair (including a wheelchair)?: A Little Help needed standing up from a chair using your arms (e.g., wheelchair or bedside chair)?: A Lot Help needed to walk in hospital room?: A Lot Help needed climbing 3-5 steps with a railing? : Total 6 Click Score: 13    End of Session Equipment Utilized During Treatment: Oxygen Activity Tolerance: Patient tolerated treatment well Patient left: in chair;with chair alarm set;with family/visitor present Nurse Communication: Mobility status PT Visit Diagnosis: Pain;Muscle weakness (generalized) (M62.81);Unsteadiness on feet (R26.81) Pain - Right/Left: Left Pain - part of body: Leg     Time: 7903-8333 PT Time Calculation (min) (ACUTE ONLY): 23 min  Charges:  $Therapeutic Exercise: 8-22 mins $Therapeutic Activity: 8-22 mins                     Kittie Plater, PT, DPT Acute Rehabilitation Services Pager #: [phone] Office #: [phone]    Berline Lopes 10/04/2019, 10:45 AM

## 2019-10-04 NOTE — Progress Notes (Signed)
Inpatient Rehabilitation-Admissions Coordinator   Expedited appeal case initiated. Will update once there has been a determination.   Raechel Ache, OTR/L  Rehab Admissions Coordinator  [phone] 10/04/2019 11:28 AM

## 2019-10-05 ENCOUNTER — Inpatient Hospital Stay (HOSPITAL_COMMUNITY): Payer: Medicare PPO | Admitting: Anesthesiology

## 2019-10-05 ENCOUNTER — Encounter (HOSPITAL_COMMUNITY): Payer: Medicare PPO

## 2019-10-05 ENCOUNTER — Encounter (HOSPITAL_COMMUNITY)
Admission: EM | Disposition: A | Discharge status: Skilled Nursing Facility | Payer: Self-pay | Source: Home / Self Care | Attending: Family Medicine

## 2019-10-05 ENCOUNTER — Inpatient Hospital Stay (HOSPITAL_COMMUNITY): Payer: Medicare PPO

## 2019-10-05 ENCOUNTER — Encounter (HOSPITAL_COMMUNITY): Payer: Self-pay | Admitting: Family Medicine

## 2019-10-05 DIAGNOSIS — I4892 Unspecified atrial flutter: Secondary | ICD-10-CM

## 2019-10-05 DIAGNOSIS — I484 Atypical atrial flutter: Secondary | ICD-10-CM

## 2019-10-05 HISTORY — PX: CARDIOVERSION: SHX1299

## 2019-10-05 HISTORY — PX: TEE WITHOUT CARDIOVERSION: SHX5443

## 2019-10-05 LAB — GLUCOSE, CAPILLARY
Glucose-Capillary: 210 mg/dL — ABNORMAL HIGH (ref 70–99)
Glucose-Capillary: 158 mg/dL — ABNORMAL HIGH (ref 70–99)
Glucose-Capillary: 158 mg/dL — ABNORMAL HIGH (ref 70–99)
Glucose-Capillary: 220 mg/dL — ABNORMAL HIGH (ref 70–99)
Glucose-Capillary: 199 mg/dL — ABNORMAL HIGH (ref 70–99)

## 2019-10-05 LAB — BASIC METABOLIC PANEL
Anion gap: 7 (ref 5–15)
BUN: 25 mg/dL — ABNORMAL HIGH (ref 8–23)
CO2: 29 mmol/L (ref 22–32)
Calcium: 8.8 mg/dL — ABNORMAL LOW (ref 8.9–10.3)
Chloride: 97 mmol/L — ABNORMAL LOW (ref 98–111)
Creatinine, Ser: 0.99 mg/dL (ref 0.61–1.24)
GFR calc Af Amer: >60 mL/min (ref >60)
GFR calc non Af Amer: >60 mL/min (ref >60)
Glucose, Bld: 219 mg/dL — ABNORMAL HIGH (ref 70–99)
Potassium: 4 mmol/L (ref 3.5–5.1)
Sodium: 133 mmol/L — ABNORMAL LOW (ref 135–145)

## 2019-10-05 SURGERY — ECHOCARDIOGRAM, TRANSESOPHAGEAL
Anesthesia: Monitor Anesthesia Care

## 2019-10-05 MED ORDER — FUROSEMIDE 40 MG PO TABS
40.0000 mg | ORAL_TABLET | Freq: Every day | ORAL | Status: DC
  Administered 2019-10-05 – 2019-10-08 (×4): 40 mg via ORAL
  Filled 2019-10-05 (×4): qty 1

## 2019-10-05 MED ORDER — PROPOFOL 500 MG/50ML IV EMUL
INTRAVENOUS | Status: DC | PRN
  Administered 2019-10-05: 150 ug/kg/min via INTRAVENOUS

## 2019-10-05 MED ORDER — PROPOFOL 10 MG/ML IV BOLUS
INTRAVENOUS | Status: DC | PRN
Start: 2019-10-05 — End: 2019-10-05
  Administered 2019-10-05: 30 mg via INTRAVENOUS
  Administered 2019-10-05: 20 mg via INTRAVENOUS

## 2019-10-05 MED ORDER — INSULIN GLARGINE 100 UNIT/ML ~~LOC~~ SOLN
30.0000 [IU] | Freq: Every day | SUBCUTANEOUS | Status: DC
  Administered 2019-10-05 – 2019-10-08 (×4): 30 [IU] via SUBCUTANEOUS
  Filled 2019-10-05 (×5): qty 0.3

## 2019-10-05 MED ORDER — IPRATROPIUM-ALBUTEROL 0.5-2.5 (3) MG/3ML IN SOLN: 3.0000 mL | Freq: Four times a day (QID) | RESPIRATORY_TRACT | Status: DC | PRN

## 2019-10-05 NOTE — Progress Notes (Signed)
Inpatient Diabetes Program Recommendations  AACE/ADA: New Consensus Statement on Inpatient Glycemic Control (2015)  Target Ranges:  Prepandial:   less than 140 mg/dL      Peak postprandial:   less than 180 mg/dL (1-2 hours)      Critically ill patients:  140 - 180 mg/dL   Lab Results  Component Value Date   GLUCAP 210 (H) 10/05/2019   HGBA1C 9.2 (H) 09/26/2019    Review of Glycemic Control Results for Rodney [name], Rodney [name] (MRN [id number]) as of 10/05/2019 09:34  Ref. Range 10/04/2019 06:09 10/04/2019 11:48 10/04/2019 16:30 10/04/2019 21:04 10/05/2019 06:19  Glucose-Capillary Latest Ref Range: 70 - 99 mg/dL 182 (H) 177 (H) 184 (H) 201 (H) 210 (H)   Diabetes history: DM 2 Outpatient Diabetes medications: 70/30 75 units QAM, 70/30 55 units QPM, Metformin 1000 mg BID Current orders for Inpatient glycemic control: Lantus 20 units QHS, Novolog 0-15 units TID with meals, Novolog 0-5 units QHS  Inpatient Diabetes Program Recommendations:  Glucose trends higher than inpatient goal 140-180.  -  Consider increasing Lantus to 25 units.   Thanks,  Tama Headings RN, MSN, BC-ADM Inpatient Diabetes Coordinator Team Pager [phone] (8a-5p)

## 2019-10-05 NOTE — Progress Notes (Addendum)
Cardiology Progress Note  Patient ID: JORAM VENSON MRN: [id number] DOB: [birth year] Date of Encounter: 10/05/2019  Primary Cardiologist: Pixie Casino, MD  Subjective   Chief Complaint: Shortness of breath still there  HPI: Remains in atrial fibrillation. Will need TEE cardioversion today. On Eliquis. Good diuresis. Approaching euvolemia.  ROS:  All other ROS reviewed and negative. Pertinent positives noted in the HPI.     Inpatient Medications  Scheduled Meds: . acetaminophen  650 mg Oral Q6H  . apixaban  5 mg Oral BID  . aspirin  81 mg Oral Daily  . atorvastatin  80 mg Oral Daily  . docusate sodium  100 mg Oral BID  . feeding supplement (ENSURE ENLIVE)  237 mL Oral TID BM  . feeding supplement (PRO-STAT SUGAR FREE 64)  30 mL Oral TID  . insulin aspart  0-15 Units Subcutaneous TID WC  . insulin aspart  0-5 Units Subcutaneous QHS  . insulin glargine  20 Units Subcutaneous QHS  . losartan  25 mg Oral Daily  . metoprolol tartrate  25 mg Oral Q6H  . multivitamin with minerals  1 tablet Oral Daily  . polyethylene glycol  17 g Oral Daily  . ramelteon  8 mg Oral QHS  . spironolactone  12.5 mg Oral Daily   Continuous Infusions: . sodium chloride Stopped (09/28/19 1359)  . sodium chloride 20 mL/hr at 10/05/19 0006   PRN Meds: bisacodyl, ipratropium-albuterol, magnesium citrate, metoCLOPramide **OR** metoCLOPramide (REGLAN) injection, ondansetron **OR** ondansetron (ZOFRAN) IV, oxyCODONE   Vital Signs   Vitals:   10/05/19 0330 10/05/19 0445 10/05/19 0500 10/05/19 0823  BP: (!) 151/75 118/76  114/78  Pulse: 86 91  85  Resp: 20 20  19   Temp: 98.2 F (36.8 C)   98 F (36.7 C)  TempSrc: Oral   Oral  SpO2: 97% 96%  93%  Weight:   108 kg   Height:        Intake/Output Summary (Last 24 hours) at 10/05/2019 1038 Last data filed at 10/05/2019 0824 Gross per 24 hour  Intake 327.55 ml  Output 1676 ml  Net -1348.45 ml   Last 3 Weights 10/05/2019 10/04/2019 10/03/2019   Weight (lbs) 238 lb 1.6 oz 237 lb 11.2 oz 240 lb 1.3 oz  Weight (kg) 108.001 kg 107.82 kg 108.9 kg      Telemetry  Overnight telemetry shows atrial fibrillation with heart rates in the 70s, which I personally reviewed.   ECG  The most recent ECG shows atrial fibrillation, heart rate 91, which I personally reviewed.   Physical Exam   Vitals:   10/05/19 0330 10/05/19 0445 10/05/19 0500 10/05/19 0823  BP: (!) 151/75 118/76  114/78  Pulse: 86 91  85  Resp: 20 20  19   Temp: 98.2 F (36.8 C)   98 F (36.7 C)  TempSrc: Oral   Oral  SpO2: 97% 96%  93%  Weight:   108 kg   Height:         Intake/Output Summary (Last 24 hours) at 10/05/2019 1038 Last data filed at 10/05/2019 0824 Gross per 24 hour  Intake 327.55 ml  Output 1676 ml  Net -1348.45 ml    Last 3 Weights 10/05/2019 10/04/2019 10/03/2019  Weight (lbs) 238 lb 1.6 oz 237 lb 11.2 oz 240 lb 1.3 oz  Weight (kg) 108.001 kg 107.82 kg 108.9 kg    Body mass index is 32.29 kg/m.   General: Well nourished, well developed, in no acute distress Head: Atraumatic,  normal size  Eyes: PEERLA, EOMI  Neck: Supple, no JVD Endocrine: No thryomegaly Cardiac: Normal S1, S2; irregular rhythm, 2 out of 6 SEM Lungs: Clear to auscultation bilaterally, no wheezing, rhonchi or rales  Abd: Soft, nontender, no hepatomegaly  Ext: No edema, pulses 2+, status post left BKA Musculoskeletal: No deformities, BUE and BLE strength normal and equal Skin: Warm and dry, no rashes   Neuro: Alert and oriented to person, place, time, and situation, CNII-XII grossly intact, no focal deficits  Psych: Normal mood and affect   Labs  High Sensitivity Troponin:  No results for input(s): TROPONINIHS in the last 720 hours.   Cardiac EnzymesNo results for input(s): TROPONINI in the last 168 hours. No results for input(s): TROPIPOC in the last 168 hours.  Chemistry Recent Labs  Lab 10/03/19 0333 10/04/19 0308 10/05/19 0325  NA 138 136 133*  K 4.2 4.1 4.0  CL 100  98 97*  CO2 29 29 29   GLUCOSE 148* 163* 219*  BUN 17 21 25*  CREATININE 0.97 1.01 0.99  CALCIUM 9.1 9.0 8.8*  GFRNONAA >60 >60 >60  GFRAA >60 >60 >60  ANIONGAP 9 9 7     Hematology Recent Labs  Lab 10/02/19 0334 10/03/19 0333 10/04/19 0308  WBC 10.0 10.0 10.3  RBC 3.23* 3.49* 3.47*  HGB 9.5* 10.3* 10.3*  HCT 30.5* 32.3* 32.0*  MCV 94.4 92.6 92.2  MCH 29.4 29.5 29.7  MCHC 31.1 31.9 32.2  RDW 13.1 13.2 13.3  PLT 522* 514* 513*   BNPNo results for input(s): BNP, PROBNP in the last 168 hours.  DDimer No results for input(s): DDIMER in the last 168 hours.   Radiology  No results found.  Cardiac Studies  LHC 10/03/2019 100% RCA Minimal LAD/LCX  Echo 09/28/2019 1. Assessment of his LV function is difficult. Consider repeat echo with  definity contrast once HR is slower . Marland Kitchen Left ventricular ejection  fraction, by estimation, is 35 to 40%. The left ventricle has moderately  decreased function. The left ventricle  demonstrates global hypokinesis. Left ventricular diastolic parameters are  indeterminate.  2. Right ventricular systolic function is mildly reduced. The right  ventricular size is normal.  3. Left atrial size was mildly dilated.  4. Right atrial size was mildly dilated.  5. The mitral valve is normal in structure. Mild mitral valve  regurgitation. No evidence of mitral stenosis.  6. The aortic valve is not well visualized in the short axis view but is  likely a 3 leaflet valve .Marland Kitchen The aortic valve is tricuspid. Aortic valve  regurgitation is not visualized. Moderate aortic valve stenosis.   Patient Profile  Rodney [name] is a 69 y.o. male with diabetes, hypertension who was admitted on 09/26/2019 for sepsis secondary to a lower extremity cellulitis. 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 Course complicated by acute systolic heart failure and atrial fibrillation.  Assessment & Plan   1. Acute systolic heart failure, ejection fraction 35-40% -Status post  left heart cath with CTO of RCA and minimal disease in LAD and left circumflex. He does have moderate aortic stenosis. -Appears to be mixed ischemic and nonischemic picture. -Good urine output. He is net -1.2 L. I suspect he is euvolemic. I will start Lasix 40 mg daily. -Net -4.6 L since admission -Continue metoprolol tartrate for rate control in setting of atrial fibrillation. He is on losartan 25 mg daily and Aldactone 12.5 mg daily. We will likely switch him to Coreg tomorrow. -Plan for TEE  cardioversion today.  2. New onset atrial fibrillation -Likely postop but did have pre-existing cardiomyopathy. -Continue Eliquis 5 mg twice daily. On metoprolol tartrate for rate control. -TEE cardioversion today. We will switch him to Coreg tomorrow.  3. Moderate aortic stenosis -We will follow this as an outpatient  4. CAD -Left heart cath with CTO of RCA. Minimal disease in LAD and left circumflex. -No symptoms of angina. -Continue aspirin statin.  For questions or updates, please contact [place] Please consult [email or web address] for contact info under   Time Spent with Patient: I have spent a total of 25 minutes with patient reviewing hospital notes, telemetry, EKGs, labs and examining the patient as well as establishing an assessment and plan that was discussed with the patient.  > 50% of time was spent in direct patient care.    Signed, Addison Naegeli. Audie Box, [place]  10/05/2019 10:38 AM

## 2019-10-05 NOTE — Op Note (Signed)
Procedure: Electrical Cardioversion Indications:  Atrial Flutter  Procedure Details:  Consent: Risks of procedure as well as the alternatives and risks of each were explained to the (patient/caregiver).  Consent for procedure obtained.  Time Out: Verified patient identification, verified procedure, site/side was marked, verified correct patient position, special equipment/implants available, medications/allergies/relevent history reviewed, required imaging and test results available.  Performed  Patient placed on cardiac monitor, pulse oximetry, supplemental oxygen as necessary.  Sedation given: IV propofol, Dr. Fransisco Beau Pacer pads placed anterior and posterior chest.  Cardioverted 1 time(s).  Cardioversion with synchronized biphasic 150J shock.  Evaluation: Findings: Post procedure EKG shows: NSR Complications: None Patient did tolerate procedure well.  Time Spent Directly with the Patient:  30 minutes   Rodney [name] 10/05/2019, 11:50 AM

## 2019-10-05 NOTE — Progress Notes (Signed)
°  Echocardiogram Echocardiogram Transesophageal has been performed.  Rodney [name] 10/05/2019, 11:50 AM

## 2019-10-05 NOTE — Anesthesia Preprocedure Evaluation (Addendum)
Anesthesia Evaluation  Patient identified by MRN, date of birth, ID band Patient awake    Reviewed: Allergy & Precautions, NPO status , Patient's Chart, lab work & pertinent test results  History of Anesthesia Complications Negative for: history of anesthetic complications  Airway Mallampati: III  TM Distance: >3 FB Neck ROM: Full    Dental  (+) Dental Advisory Given, Loose, Missing,    Pulmonary former smoker,    Pulmonary exam normal        Cardiovascular hypertension, Pt. on medications + Peripheral Vascular Disease and +CHF  + dysrhythmias Atrial Fibrillation + Valvular Problems/Murmurs AS  Rhythm:Irregular Rate:Normal   10/03/19 Cath - Mr. Canada has mild nonobstructive disease in his left system including the AV groove circumflex and LAD with a chronically occluded RCA, faint grade 1 left to right collaterals and a 30 mm peak to peak gradient across his aortic valve.  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  His LVEDP was 35.  At this point, I would recommend medical therapy.  '21 TTE - EF 35 to 40%. The left ventricle demonstrates global hypokinesis. Left ventricular diastolic parameters are indeterminate. Right ventricular systolic function is mildly reduced. Left and right atrial sizes were mildly dilated. Mild mitral valve regurgitation. Moderate aortic valve stenosis.     Neuro/Psych negative neurological ROS  negative psych ROS   GI/Hepatic Neg liver ROS, GERD  Controlled,  Endo/Other  diabetes, Type 2, Oral Hypoglycemic Agents, Insulin Dependent  Renal/GU negative Renal ROS     Musculoskeletal negative musculoskeletal ROS (+)   Abdominal   Peds  Hematology  (+) anemia ,   Anesthesia Other Findings Covid neg 6/1  Reproductive/Obstetrics                            Anesthesia Physical Anesthesia Plan  ASA: III  Anesthesia Plan: MAC and General   Post-op Pain  Management:    Induction: Intravenous  PONV Risk Score and Plan: 1 and Treatment may vary due to age or medical condition and Propofol infusion  Airway Management Planned: Mask and Natural Airway  Additional Equipment: None  Intra-op Plan:   Post-operative Plan:   Informed Consent: I have reviewed the patients History and Physical, chart, labs and discussed the procedure including the risks, benefits and alternatives for the proposed anesthesia with the patient or authorized representative who has indicated his/her understanding and acceptance.       Plan Discussed with: CRNA and Anesthesiologist  Anesthesia Plan Comments: (May begin TEE as MAC, convert to GA with natural airway if cardioverting )       Anesthesia Quick Evaluation

## 2019-10-05 NOTE — Op Note (Signed)
INDICATIONS: atrial flutter pre-cardioversion  PROCEDURE:   Informed consent was obtained prior to the procedure. The risks, benefits and alternatives for the procedure were discussed and the patient comprehended these risks.  Risks include, but are not limited to, cough, sore throat, vomiting, nausea, somnolence, esophageal and stomach trauma or perforation, bleeding, low blood pressure, aspiration, pneumonia, infection, trauma to the teeth and death.    After a procedural time-out, the oropharynx was anesthetized with 20% benzocaine spray.   During this procedure the patient was administered IV propofol by Anesthesiology, Dr. Fransisco Beau.  The transesophageal probe was inserted in the esophagus  without difficulty, but only a few images were obtained due to rapid deterioration in oxygenation status. Images were focused on excluding left atrial thrombus.  The patient was kept under observation until the patient left the procedure room.  The patient left the procedure room in stable condition.   Agitated microbubble saline contrast was not administered.  COMPLICATIONS:    Abbreviated study due to hypoventilation/hypoxemia.  Mild bleeding from loose left upper maxillary tooth, still in place.  FINDINGS:  No left atrial thrombus. Normal appendage contractility. Calcific tri-leaflet aortic valve. No other reliable information can be obtained from the limited study.  RECOMMENDATIONS:    Proceed with cardioversion. Avoid future conscious sedation or MAC procedures. If TEE is necessary, would preform after endotracheal intubation for airway management.  Time Spent Directly with the Patient:  30 minutes   Rodney [name] 10/05/2019, 11:45 AM

## 2019-10-05 NOTE — Anesthesia Procedure Notes (Signed)
Procedure Name: MAC Date/Time: 10/05/2019 11:55 AM Performed by: Amadeo Garnet, CRNA Pre-anesthesia Checklist: Patient identified, Emergency Drugs available, Suction available and Patient being monitored Patient Re-evaluated:Patient Re-evaluated prior to induction Oxygen Delivery Method: Nasal cannula Preoxygenation: Pre-oxygenation with 100% oxygen Induction Type: IV induction Placement Confirmation: positive ETCO2 Dental Injury: Teeth and Oropharynx as per pre-operative assessment

## 2019-10-05 NOTE — Interval H&P Note (Signed)
History and Physical Interval Note:  10/05/2019 10:03 AM  Rodney [name]  has presented today for surgery, with the diagnosis of A-FIB.  The various methods of treatment have been discussed with the patient and family. After consideration of risks, benefits and other options for treatment, the patient has consented to  Procedure(s): TRANSESOPHAGEAL ECHOCARDIOGRAM (TEE) (N/A) CARDIOVERSION (N/A) as a surgical intervention.  The patient's history has been reviewed, patient examined, no change in status, stable for surgery.  I have reviewed the patient's chart and labs.  Questions were answered to the patient's satisfaction.     Young Mulvey

## 2019-10-05 NOTE — Progress Notes (Signed)
Family Medicine Teaching Service Daily Progress Note Intern Pager: [phone]  Patient name: Rodney [name] Medical record number: [id number] Date of birth: 11-Jan-1951 Age: 69 y.o. Gender: male  Primary Care Provider: [name], MD Consultants: Orthopedics Code Status: Full code  Pt Overview and Major Events to Date:   Assessment and Plan: Rodney [name] is a 69 y.o. male presenting with left foot cellulitis/ulceration. PMH is significant for hypertension, hyperlipidemia, tobacco abuse, type 2 diabetes, dyspnea, peripheral edema.  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  WBCs stable at 10.3 on 6/9, on admission was 17.  Remains afebrile, vitals stabilizing. -Orthopedics consulted, appreciate recommendations -Patient has completed antibiotic regimen -PT/OT eval and treat - recommend CIR versus SNF for d/c -CIR consulted and recommend admission for inpatient rehabilitation pending clear disposition plan -Patient's insurance denied CIR approval due to lack of prosthesis, peer-to-peer completed and appeal has been placed -Vitals per routine -Morning CBCs -Tylenol and oxycodone as needed for pain, decreasing oxycodone dose  Acute Hypoxic respiratory failure secondary to pneumonia and new onset heart failure with reduced EF (acute).  Symptoms improving at this time.  O2 weaned this morning.  Oxygen saturations remained above 94% -Antibiotic regimen completed -Continue diuresis with 80 mg IV Lasix -Cardiology following, appreciate their recommendations   Acute heart failure in the setting of new diagnosis of HFrEF, stable Patient had echo 6/3 showing LVEF 35-40%. RV systolic function is mildly reduced, both atria mildly dilated.  Left heart cath showed 100% stenosis of proximal RCA -Cardiology consulted, appreciate recommendations -Lasix decreased to 40 mg twice daily -Continue aspirin -Patient is on metoprolol 25 mg  every 6 hours -Losartan 25 mg daily -Plan to switch him to Coreg tomorrow -Continue Aldactone 12.5 mg daily -Continue smoking cessation counseling -Strict I's and O's  New onset Atrial Fibrillation  CHADS2VASC is 5.  EKG on 6/6 significant for a fib with RVR. Denies any chest pain of SOB.  Cardiology consulted, appreciate recs.  EKG 6/9 in the afternoon showed sinus rhythm. -Cardiology consulted, appreciate recommendations -Lasix 80 mg twice daily -Metoprolol 25 mg q6h for rate control -Plan to switch to Coreg tomorrow -TEE/DCCV scheduled for 6/10 -Eliquis 5 mg twice daily -N.p.o. at this time for TEE/DCCV if needed    T2DM with neuropathy  PVD, stable Hemoglobin A1c 09/21/2019 was 8.7.  CBG this a.m. 158.  Stable.  Medications include Novolin 70/30 75 units in the morning and 55 units in the evening, Metformin 1000 mg twice daily -Hold patient's home insulin, Metformin -Lantus 20 units last night, will increase to 30 units after TEE -Moderate SSI -CBG monitoring  -Vascular Arterial Duplex of LE ordered to evaluate PVD  Hyponatremia, 132 6/5.  Most likely due to hypervolemia, improving with diuresis.  -Monitor with diuresis.   Anemia, in part IDA with low iron saturations, ferritin, likely multifactorial.  Secondary to acute blood loss from surgery and underlying chronic disease. Normocytic decreased iron at 23 with normal TIBC and decreased saturation radiates.  Vitamin B-12 levels are elevated at 928.  Reticulocyte panel shows increased immature reticulocyte fraction. -Feraheme given 6/6.   AKI, improving Cr 2.08 on admission, BL ~1.1, today 0.99. -Morning BMPs -KVO IVF -Avoid nephrotoxic agents -Holding lisinopril-HCTZ combo -Giving Lasix 80 mg IV twice daily -Initiating losartan 25 mg daily  History of alcohol use disorder Patient declined alcohol use on admission; however, patient's PCP reported otherwise.  CIWA is overnight 0, no Ativan on board. -Monitor  CIWA's -Call for Ativan if CIWA's >8.  Peripheral edema, resolved Medications include furosemide 40 mg daily -Giving Lasix 40 mg IV given new HFrEF -Strict I's and O's  Hyperlipidemia, chronic, stable Home medication includes atorvastatin 40 mg daily. Patient's 10-year ASCVD risk 41.1%. -Continue home atorvastatin 40 mg qHS  Hypertension, well controlled Home medication includes Lisinopril-HCTZ 20-25 mg daily.  Blood pressures stable. -Holding lisinopril-HCTZ at this time in the setting of hypotension and AKI -Continue Lasix  History of tobacco abuse Patient reportedly recently quit smoking -Recommend continued cessation  FEN/GI: Carb modified diet, n.p.o. at midnight PPx: Heparin gtt   Disposition: TEE/DCCV 6/10, possibly CIR for SNF after  Subjective:  Patient is doing okay this morning but reports that he wants to get to rehab so that he can go home.  Reports his breathing is doing well, no other concerns  Objective: Temp:  [98.2 F (36.8 C)-98.9 F (37.2 C)] 98.2 F (36.8 C) (06/10 0330) Pulse Rate:  [70-95] 91 (06/10 0445) Resp:  [16-22] 20 (06/10 0445) BP: (113-151)/(63-79) 118/76 (06/10 0445) SpO2:  [94 %-99 %] 96 % (06/10 0445) Physical Exam: General: Lying in bed watching TV Cardiovascular: Regular rate, 2 out of 6 systolic murmur heard this morning Respiratory: Normal work of breathing, no wheezes noted this morning, 2 L nasal cannula when I enter room but I cut this off and his oxygen saturation stays the same at above 94% Extremities: Right lower extremity edema resolved.. Patient is s/p left BKA, bandages intact with wound VAC in place, no drainage  Laboratory: Recent Labs  Lab 10/02/19 0334 10/03/19 0333 10/04/19 0308  WBC 10.0 10.0 10.3  HGB 9.5* 10.3* 10.3*  HCT 30.5* 32.3* 32.0*  PLT 522* 514* 513*   Recent Labs  Lab 10/03/19 0333 10/04/19 0308 10/05/19 0325  NA 138 136 133*  K 4.2 4.1 4.0  CL 100 98 97*  CO2 29 29 29   BUN 17 21 25*   CREATININE 0.97 1.01 0.99  CALCIUM 9.1 9.0 8.8*  GLUCOSE 148* 163* 219*   Imaging/Diagnostic Tests: No results found.   Gifford Shave, MD 10/05/2019, 6:14 AM PGY-1, [place] Intern pager: [phone], text pages welcome

## 2019-10-05 NOTE — Progress Notes (Signed)
Responded to Spiritual Care Consult to provide patient with document.  Visited briefly with  Patient and left paperwork with patient.  When ready unit will page chaplain.  Chaplain will follow as needed.  Jaclynn Major, [place], B CC, Pager [phone]

## 2019-10-05 NOTE — Transfer of Care (Signed)
Immediate Anesthesia Transfer of Care Note  Patient: Rodney [name]  Procedure(s) Performed: TRANSESOPHAGEAL ECHOCARDIOGRAM (TEE) (N/A ) CARDIOVERSION (N/A )  Patient Location: Endoscopy Unit  Anesthesia Type:MAC  Level of Consciousness: awake, alert  and oriented  Airway & Oxygen Therapy: Patient Spontanous Breathing and Patient connected to face mask oxygen  Post-op Assessment: Report given to RN, Post -op Vital signs reviewed and stable and Patient moving all extremities  Post vital signs: Reviewed and stable  Last Vitals:  Vitals Value Taken Time  BP 88/52 10/05/19 1154  Temp 36.5 C 10/05/19 1154  Pulse 85 10/05/19 1155  Resp 19 10/05/19 1155  SpO2 94 % 10/05/19 1155  Vitals shown include unvalidated device data.  Last Pain:  Vitals:   10/05/19 1154  TempSrc: Axillary  PainSc: 0-No pain      Patients Stated Pain Goal: 0 (77/11/65 7903)  Complications: No complications documented.

## 2019-10-05 NOTE — Anesthesia Postprocedure Evaluation (Signed)
Anesthesia Post Note  Patient: Rodney [name]  Procedure(s) Performed: TRANSESOPHAGEAL ECHOCARDIOGRAM (TEE) (N/A ) CARDIOVERSION (N/A )     Patient location during evaluation: PACU Anesthesia Type: General Level of consciousness: awake and alert Pain management: pain level controlled Vital Signs Assessment: post-procedure vital signs reviewed and stable Respiratory status: spontaneous breathing, nonlabored ventilation, respiratory function stable and patient connected to nasal cannula oxygen Cardiovascular status: blood pressure returned to baseline and stable Postop Assessment: no apparent nausea or vomiting Anesthetic complications: no   No complications documented.  Last Vitals:  Vitals:   10/05/19 1207 10/05/19 1219  BP: 99/70 102/64  Pulse: 86 85  Resp: (!) 23 15  Temp:  (!) 36.3 C  SpO2: 93% 95%    Last Pain:  Vitals:   10/05/19 1219  TempSrc: Oral  PainSc:                  Audry Pili

## 2019-10-06 ENCOUNTER — Encounter (HOSPITAL_COMMUNITY): Payer: Self-pay | Admitting: Cardiovascular Disease

## 2019-10-06 ENCOUNTER — Other Ambulatory Visit: Payer: Self-pay

## 2019-10-06 LAB — BASIC METABOLIC PANEL
Anion gap: 8 (ref 5–15)
BUN: 23 mg/dL (ref 8–23)
CO2: 30 mmol/L (ref 22–32)
Calcium: 9 mg/dL (ref 8.9–10.3)
Chloride: 97 mmol/L — ABNORMAL LOW (ref 98–111)
Creatinine, Ser: 1.1 mg/dL (ref 0.61–1.24)
GFR calc Af Amer: >60 mL/min (ref >60)
GFR calc non Af Amer: >60 mL/min (ref >60)
Glucose, Bld: 219 mg/dL — ABNORMAL HIGH (ref 70–99)
Potassium: 4.2 mmol/L (ref 3.5–5.1)
Sodium: 135 mmol/L (ref 135–145)

## 2019-10-06 LAB — GLUCOSE, CAPILLARY
Glucose-Capillary: 190 mg/dL — ABNORMAL HIGH (ref 70–99)
Glucose-Capillary: 181 mg/dL — ABNORMAL HIGH (ref 70–99)
Glucose-Capillary: 179 mg/dL — ABNORMAL HIGH (ref 70–99)
Glucose-Capillary: 218 mg/dL — ABNORMAL HIGH (ref 70–99)

## 2019-10-06 MED ORDER — CARVEDILOL 12.5 MG PO TABS
12.5000 mg | ORAL_TABLET | Freq: Two times a day (BID) | ORAL | Status: DC
  Administered 2019-10-06 – 2019-10-09 (×6): 12.5 mg via ORAL
  Filled 2019-10-06 (×8): qty 1

## 2019-10-06 MED ORDER — AMIODARONE HCL 200 MG PO TABS
400.0000 mg | ORAL_TABLET | Freq: Two times a day (BID) | ORAL | Status: DC
  Administered 2019-10-06 – 2019-10-09 (×7): 400 mg via ORAL
  Filled 2019-10-06 (×7): qty 2

## 2019-10-06 MED ORDER — AMIODARONE HCL 200 MG PO TABS: 200.0000 mg | ORAL_TABLET | Freq: Every day | ORAL | Status: DC

## 2019-10-06 MED ORDER — EMPAGLIFLOZIN 10 MG PO TABS
10.0000 mg | ORAL_TABLET | Freq: Every day | ORAL | Status: DC
  Administered 2019-10-06 – 2019-10-09 (×4): 10 mg via ORAL
  Filled 2019-10-06 (×4): qty 1

## 2019-10-06 NOTE — Care Management (Signed)
10-06-19 Benefits check submitted for Jardiance and Eliquis. Transitions of care team to follow for cost. Graves-Bigelow, Ocie Cornfield, RN,BSN Case Manager

## 2019-10-06 NOTE — TOC Progression Note (Signed)
Transition of Care [facility]) - Progression Note    Patient Details  Name: TORIAN QUINTERO MRN: [id number] Date of Birth: 02-Sep-1950  Transition of Care [facility] CM/SW [place], [place] Phone Number: 10/06/2019, 2:53 PM  Clinical Narrative:     CSW visit with patient at bedside. CSW introduced self and explained role. CSW discussed SNF as back up plan for CIR. Patient states if insurance denies CIR, he was agreeable to SNF" I have to go, I do not have another choice". Patient informed he has not received covid vaccines.  CSW explained the SNF process. Patient states no preferred SNF but requested CSW call his significant other [name].   CSW spoke with [name], she was agreeable to discharge plan to SNF.She states preferred SNF is [facility]. CSW was giving permission to send referral to other SNF as back up. CSW explained the SNF process and answered all questions.  CSW will provide bed offers once available.   Thurmond Butts, MSW, [place] Clinical Social Worker   Expected Discharge Plan: (P) Skilled Nursing Facility Barriers to Discharge: (P) Continued Medical Work up  Expected Discharge Plan and Services Expected Discharge Plan: (P) [place] In-house Referral: (P) NA Discharge Planning Services: CM Consult Post Acute Care Choice: (P) IP Rehab Living arrangements for the past 2 months: Single Family Home                                       Social Determinants of Health (SDOH) Interventions    Readmission Risk Interventions No flowsheet data found.

## 2019-10-06 NOTE — Progress Notes (Signed)
Cardiology Progress Note  Patient ID: BROGAN MARTIS MRN: [id number] DOB: 1950-06-24 Date of Encounter: 10/06/2019  Primary Cardiologist: Pixie Casino, MD  Subjective   Chief Complaint: Status post cardioversion yesterday.  Normal sinus rhythm.  HPI: Doing well this morning.  In normal sinus rhythm.  Coreg added.  We will add amiodarone.  ROS:  All other ROS reviewed and negative. Pertinent positives noted in the HPI.     Inpatient Medications  Scheduled Meds: . acetaminophen  650 mg Oral Q6H  . amiodarone  400 mg Oral BID   Followed by  . [START ON 10/13/2019] amiodarone  200 mg Oral Daily  . apixaban  5 mg Oral BID  . aspirin  81 mg Oral Daily  . atorvastatin  80 mg Oral Daily  . carvedilol  12.5 mg Oral BID WC  . docusate sodium  100 mg Oral BID  . feeding supplement (ENSURE ENLIVE)  237 mL Oral TID BM  . feeding supplement (PRO-STAT SUGAR FREE 64)  30 mL Oral TID  . furosemide  40 mg Oral Daily  . insulin aspart  0-15 Units Subcutaneous TID WC  . insulin aspart  0-5 Units Subcutaneous QHS  . insulin glargine  30 Units Subcutaneous QHS  . losartan  25 mg Oral Daily  . multivitamin with minerals  1 tablet Oral Daily  . polyethylene glycol  17 g Oral Daily  . ramelteon  8 mg Oral QHS  . spironolactone  12.5 mg Oral Daily   Continuous Infusions: . sodium chloride Stopped (09/28/19 1359)   PRN Meds: bisacodyl, ipratropium-albuterol, magnesium citrate, metoCLOPramide **OR** metoCLOPramide (REGLAN) injection, ondansetron **OR** ondansetron (ZOFRAN) IV, oxyCODONE   Vital Signs   Vitals:   10/05/19 1915 10/05/19 2350 10/06/19 0347 10/06/19 0903  BP: 116/75 119/82 115/62 115/66  Pulse: 95 86 80 79  Resp: 19 (!) 21 18 15   Temp: 98.6 F (37 C) 98.4 F (36.9 C) 98.6 F (37 C)   TempSrc: Oral Oral Oral   SpO2: 94% 97% 92% 96%  Weight:   110 kg   Height:        Intake/Output Summary (Last 24 hours) at 10/06/2019 0931 Last data filed at 10/06/2019 0000 Gross  per 24 hour  Intake 790 ml  Output 630 ml  Net 160 ml   Last 3 Weights 10/06/2019 10/05/2019 10/04/2019  Weight (lbs) 242 lb 8.1 oz 238 lb 1.6 oz 237 lb 11.2 oz  Weight (kg) 110 kg 108.001 kg 107.82 kg      Telemetry  Overnight telemetry shows normal sinus rhythm with heart rate in 70s, occasional PVCs, which I personally reviewed.   ECG  The most recent ECG shows normal sinus rhythm, heart rate 88, nonspecific ST-T changes, which I personally reviewed.   Physical Exam   Vitals:   10/05/19 1915 10/05/19 2350 10/06/19 0347 10/06/19 0903  BP: 116/75 119/82 115/62 115/66  Pulse: 95 86 80 79  Resp: 19 (!) 21 18 15   Temp: 98.6 F (37 C) 98.4 F (36.9 C) 98.6 F (37 C)   TempSrc: Oral Oral Oral   SpO2: 94% 97% 92% 96%  Weight:   110 kg   Height:         Intake/Output Summary (Last 24 hours) at 10/06/2019 0931 Last data filed at 10/06/2019 0000 Gross per 24 hour  Intake 790 ml  Output 630 ml  Net 160 ml    Last 3 Weights 10/06/2019 10/05/2019 10/04/2019  Weight (lbs) 242 lb 8.1 oz  238 lb 1.6 oz 237 lb 11.2 oz  Weight (kg) 110 kg 108.001 kg 107.82 kg    Body mass index is 32.89 kg/m.   General: Well nourished, well developed, in no acute distress Head: Atraumatic, normal size  Eyes: PEERLA, EOMI  Neck: Supple, no JVD Endocrine: No thryomegaly Cardiac: Normal S1, S2; RRR; systolic ejection murmur 3/6 Lungs: Clear to auscultation bilaterally, no wheezing, rhonchi or rales  Abd: Soft, nontender, no hepatomegaly  Ext: Status post left BKA Musculoskeletal: No deformities, BUE and BLE strength normal and equal Skin: Warm and dry, no rashes   Neuro: Alert and oriented to person, place, time, and situation, CNII-XII grossly intact, no focal deficits  Psych: Normal mood and affect   Labs  High Sensitivity Troponin:  No results for input(s): TROPONINIHS in the last 720 hours.   Cardiac EnzymesNo results for input(s): TROPONINI in the last 168 hours. No results for input(s): TROPIPOC  in the last 168 hours.  Chemistry Recent Labs  Lab 10/04/19 0308 10/05/19 0325 10/06/19 0331  NA 136 133* 135  K 4.1 4.0 4.2  CL 98 97* 97*  CO2 29 29 30   GLUCOSE 163* 219* 219*  BUN 21 25* 23  CREATININE 1.01 0.99 1.10  CALCIUM 9.0 8.8* 9.0  GFRNONAA >60 >60 >60  GFRAA >60 >60 >60  ANIONGAP 9 7 8     Hematology Recent Labs  Lab 10/02/19 0334 10/03/19 0333 10/04/19 0308  WBC 10.0 10.0 10.3  RBC 3.23* 3.49* 3.47*  HGB 9.5* 10.3* 10.3*  HCT 30.5* 32.3* 32.0*  MCV 94.4 92.6 92.2  MCH 29.4 29.5 29.7  MCHC 31.1 31.9 32.2  RDW 13.1 13.2 13.3  PLT 522* 514* 513*   BNPNo results for input(s): BNP, PROBNP in the last 168 hours.  DDimer No results for input(s): DDIMER in the last 168 hours.   Radiology  ECHO TEE  Result Date: 10/05/2019    TRANSESOPHOGEAL ECHO REPORT   Patient Name:   SANTO ZAHRADNIK Date of Exam: 10/05/2019 Medical Rec #:  [id number]         Height:       72.0 in Accession #:    [id number]        Weight:       238.1 lb Date of Birth:  Jan 05, 1951        BSA:          2.294 m Patient Age:    7 years          BP:           139/65 mmHg Patient Gender: M                 HR:           89 bpm. Exam Location:  Inpatient Procedure: Transesophageal Echo and Cardiac Doppler Indications:     Atrial fibrillation  History:         Patient has prior history of Echocardiogram examinations, most                  recent 09/28/2019. CAD, Mitral Valve Disease, Arrythmias:Atrial                  Fibrillation; Risk Factors:obesity.  Sonographer:     Dustin Flock Referring Phys:  [id number] [place] Diagnosing Phys: Sanda Klein MD PROCEDURE: The transesophogeal probe was passed without difficulty through the esophogus of the patient. Sedation performed by performing physician. The patient was monitored while under deep  sedation. Anesthestetic sedation was provided intravenously by  Anesthesiology: 434.18mg  of Propofol. The patient developed Respiratory depression during the  procedure. Abbreviated study due to hypoventilation/hypoxia. Dilated left atrium without evidence of left atrial thrombus. Moderately depressed left ventricular systolic function. Degenerative/calcific aortic valve changes with probably moderate stenosis (trileaflet valve). 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 No pericardial effusion.Dani Gobble Croitoru MD Electronically signed by Sanda Klein MD Signature Date/Time: 10/05/2019/3:59:33 PM    Final     Cardiac Studies  TTE 09/28/2019 1. Assessment of his LV function is difficult. Consider repeat echo with  definity contrast once HR is slower . Marland Kitchen Left ventricular ejection  fraction, by estimation, is 35 to 40%. The left ventricle has moderately  decreased function. The left ventricle  demonstrates global hypokinesis. Left ventricular diastolic parameters are  indeterminate.  2. Right ventricular systolic function is mildly reduced. The right  ventricular size is normal.  3. Left atrial size was mildly dilated.  4. Right atrial size was mildly dilated.  5. The mitral valve is normal in structure. Mild mitral valve  regurgitation. No evidence of mitral stenosis.  6. The aortic valve is not well visualized in the short axis view but is  likely a 3 leaflet valve .Marland Kitchen The aortic valve is tricuspid. Aortic valve  regurgitation is not visualized. Moderate aortic valve stenosis.   LHC 10/03/2019 CTO RCA  Patient Profile  MOURAD CWIKLA is a 69 y.o. male with diabetes, hypertension who was admitted on 09/26/2019 for sepsis secondary to a lower extremity cellulitis. 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 Course complicated by acute systolic heart failure and atrial fibrillation.  Assessment & Plan   1.  Acute systolic heart failure, ejection fraction 35-40% -Right heart cath with CTO of RCA.  Minimal disease in LAD and left circumflex -TEE cardioversion 10/05/2019.  Back in normal sinus rhythm.  We will transition to Coreg 12.5  mg twice daily.  Stop metoprolol. -He is on losartan 25 mg daily and Aldactone 12.5 mg daily -Continue Lasix 40 mg p.o. daily -Suspect the etiology is mixed ischemic and nonischemic.  2.  New onset atrial fibrillation, likely postop -Developed A. fib after his left BKA -Status post TEE cardioversion on 10/05/2019 -Continue Eliquis -We will plan for a 1 month course of amiodarone, 40 mg twice daily for 1 week and then 200 mg daily for 3 weeks.  He should stop after this.  Hopefully he will maintain sinus rhythm after this.  We will arrange follow-up once he leaves the hospital in our office.  3.  CAD, CTO RCA -Aspirin statin.  No symptoms of angina.  4.  Moderate aortic stenosis -We will follow this as an outpatient  For questions or updates, please contact [place] Please consult [email or web address] for contact info under   Time Spent with Patient: I have spent a total of 25 minutes with patient reviewing hospital notes, telemetry, EKGs, labs and examining the patient as well as establishing an assessment and plan that was discussed with the patient.  > 50% of time was spent in direct patient care.    Signed, Addison Naegeli. Audie Box, [place]  10/06/2019 9:31 AM

## 2019-10-06 NOTE — Progress Notes (Signed)
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 Also with newly diagnosed systolic heart failure, LVEF 35-40% and at least moderate aortic stenosis. s/p cardioversion 6/10. PMH includes HTN, HLD, DM.    PT Comments    Patient progressing well towards PT goals. Reports no pain today. Tolerated standing and hopping a few feet with encouragement. Reports fatigue in RLE with hopping. Plan for gait training away from bed next session as pt allows with chair follow to ease fears. Tolerated there ex in standing with LLE. Appears to have good AROM of left residual limb. Eager to get to CIR. Will follow.   Follow Up Recommendations  CIR;Supervision for mobility/OOB     Equipment Recommendations  Wheelchair (measurements PT);Wheelchair cushion (measurements PT);Rolling walker with 5" wheels    Recommendations for Other Services       Precautions / Restrictions Precautions Precautions: Fall Precaution Comments: Lft residual limb wound vac Required Braces or Orthoses: Other Brace Other Brace: limb guard; declines using it Restrictions Weight Bearing Restrictions: Yes LLE Weight Bearing: Non weight bearing    Mobility  Bed Mobility Overal bed mobility: Needs Assistance Bed Mobility: Supine to Sit;Sit to Supine     Supine to sit: Supervision;HOB elevated Sit to supine: Supervision;HOB elevated   General bed mobility comments: Use of momentum to get to EOB, no assist needed. use of rail.  Transfers Overall transfer level: Needs assistance Equipment used: Rolling walker (2 wheeled) Transfers: Sit to/from Stand Sit to Stand: Min guard;Min assist         General transfer comment: "make sure this thing doesn't roll away." "Raise the bed" Stood from elevated  bed height x2 with use of momentum and hands on RW.  Ambulation/Gait Ambulation/Gait assistance: Min guard Gait Distance (Feet): 3 Feet (x2 bouts) Assistive device: Rolling walker (2 wheeled) Gait Pattern/deviations: Trunk flexed ("hop to") Gait velocity: decreased   General Gait Details: Able to hop on RLE with use of RW along side bed with 1 seated rest break; fatigues. Refuses to wear a anti slip sock, "those things slip bad."   Stairs             Wheelchair Mobility    Modified Rankin (Stroke Patients Only)       Balance Overall balance assessment: Needs assistance Sitting-balance support: No upper extremity supported (foot supported) Sitting balance-Leahy Scale: Good     Standing balance support: During functional activity Standing balance-Leahy Scale: Poor Standing balance comment: dependent on RW                            Cognition Arousal/Alertness: Awake/alert Behavior During Therapy: WFL for tasks assessed/performed Overall Cognitive Status: Within Functional Limits for tasks assessed                                 General Comments: Very particular about session; "i need rehab so i can get out of here." "I want to do the least amount as possible."      Exercises Amputee Exercises Hip Extension: Strengthening;Left;10 reps;Standing Hip ABduction/ADduction: Strengthening;Left;10 reps;Standing Straight Leg Raises: AROM;Left;5 reps;Supine    General Comments General comments (skin integrity, edema, etc.): VSS on RA.  Pertinent Vitals/Pain Pain Assessment: No/denies pain    Home Living                      Prior Function            PT Goals (current goals can now be found in the care plan section) Progress towards PT goals: Progressing toward goals    Frequency    Min 3X/week      PT Plan Current plan remains appropriate    Co-evaluation              AM-PAC PT "6 Clicks" Mobility    Outcome Measure  Help needed turning from your back to your side while in a flat bed without using bedrails?: None Help needed moving from lying on your back to sitting on the side of a flat bed without using bedrails?: A Little Help needed moving to and from a bed to a chair (including a wheelchair)?: A Little Help needed standing up from a chair using your arms (e.g., wheelchair or bedside chair)?: A Little Help needed to walk in hospital room?: A Little Help needed climbing 3-5 steps with a railing? : Total 6 Click Score: 17    End of Session Equipment Utilized During Treatment: Oxygen Activity Tolerance: Patient tolerated treatment well Patient left: in bed;with call bell/phone within reach;with bed alarm set Nurse Communication: Mobility status PT Visit Diagnosis: Muscle weakness (generalized) (M62.81);Unsteadiness on feet (R26.81)     Time: 1111-1130 PT Time Calculation (min) (ACUTE ONLY): 19 min  Charges:  $Therapeutic Activity: 8-22 mins                     Marisa Severin, PT, DPT Acute Rehabilitation Services Pager [phone] Office [phone]       Marguarite Arbour A Sabra Heck 10/06/2019, 12:43 PM

## 2019-10-06 NOTE — Progress Notes (Signed)
Inpatient Rehabilitation-Admissions Coordinator   Was notified that the patient's expedited appeal request for CIR has been denied. Discussed denial with patient. TOC team aware of denial. AC will sign off.   Please call if questions.   Raechel Ache, OTR/L  Rehab Admissions Coordinator  [phone] 10/06/2019 12:16 PM

## 2019-10-06 NOTE — Care Management Important Message (Signed)
Important Message  Patient Details  Name: HAROON SHATTO MRN: [id number] Date of Birth: 1950/08/11   Medicare Important Message Given:  Yes     Shelda Altes 10/06/2019, 9:57 AM

## 2019-10-06 NOTE — NC FL2 (Signed)
[place] LEVEL OF CARE SCREENING TOOL     IDENTIFICATION  Patient Name: Rodney [name] Birthdate: [birth year] Sex: male Admission Date (Current Location): 09/26/2019  [facility] and [place] Number:  Herbalist and Address:  [facility]. [facility], [place] [address], [place],  [address]      Provider Number: [id number]  Attending Physician Name and Address:  Lind Covert, MD  Relative Name and Phone Number:       Current Level of Care: Hospital Recommended Level of Care: [place] Prior Approval Number:    Date Approved/Denied:   PASRR Number: [id number]  Discharge Plan: SNF    Current Diagnoses: Patient Active Problem List   Diagnosis Date Noted  . Atypical atrial flutter ([place])   . Atrial fibrillation ([place]) 10/03/2019  . Sepsis ([place]) 09/28/2019  . HFrEF (heart failure with reduced ejection fraction) ([place]) 09/28/2019  . Moderate aortic stenosis 09/28/2019  . Anemia in chronic illness 09/27/2019  . Necrotizing fasciitis ([place]) 09/26/2019  . 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 Severe protein-calorie malnutrition ([place])   . PVD (peripheral vascular disease) ([place])   . Cellulitis 09/22/2019  . Foot ulcer ([place]) 09/22/2019  . SOB (shortness of breath) 07/26/2018  . Bilateral otitis media 06/22/2018  . Peripheral edema 02/24/2018  . Umbilical hernia 81/82/9937  . Obesity 10/09/2015  . Tobacco abuse 09/07/2013  . Uncontrolled type 2 diabetes mellitus with diabetic neuropathy, with long-term current use of insulin ([place]) 09/07/2013  . Colon cancer screening 09/07/2013  . Hypercholesteremia 10/28/2010  . ERECTILE DYSFUNCTION 05/22/2009  . HYPERTENSION 01/23/2009    Orientation RESPIRATION BLADDER Height & Weight     Self, Time, Situation, Place  Normal Continent Weight: 242 lb 8.1 oz (110 kg) Height:  6' (182.9 cm)  BEHAVIORAL SYMPTOMS/MOOD NEUROLOGICAL BOWEL NUTRITION STATUS       Continent Diet (please see discharge summary)  AMBULATORY STATUS COMMUNICATION OF NEEDS Skin   Supervision Verbally Surgical wounds (closed incision , left leg, neagtive pressure wound, neagative presure wound therapy left leg)                       Personal Care Assistance Level of Assistance  Bathing, Feeding, Dressing Bathing Assistance: Limited assistance Feeding assistance: Independent Dressing Assistance: Limited assistance     Functional Limitations Info  Sight, Hearing, Speech Sight Info: Adequate Hearing Info: Adequate Speech Info: Adequate    SPECIAL CARE FACTORS FREQUENCY  PT (By licensed PT), OT (By licensed OT)     PT Frequency: 5x per week OT Frequency: 5x per week            Contractures Contractures Info: Not present    Additional Factors Info  Code Status, Allergies Code Status Info: FULL Code Allergies Info: NKA           Current Medications (10/06/2019):  This is the current hospital active medication list Current Facility-Administered Medications  Medication Dose Route Frequency Provider Last Rate Last Admin  . 0.9 %  sodium chloride infusion   Intravenous Continuous Lorretta Harp, MD   Stopped at 09/28/19 1359  . acetaminophen (TYLENOL) tablet 650 mg  650 mg Oral Q6H Lorretta Harp, MD   650 mg at 10/06/19 1523  . amiodarone (PACERONE) tablet 400 mg  400 mg Oral BID Geralynn Rile, MD   400 mg at 10/06/19 0948   Followed by  . [START ON 10/13/2019] amiodarone (  PACERONE) tablet 200 mg  200 mg Oral Daily O'Neal, Cassie Freer, MD      . apixaban Arne [place]) tablet 5 mg  5 mg Oral BID Furth, Cadence H, PA-C   5 mg at 10/06/19 0949  . aspirin chewable tablet 81 mg  81 mg Oral Daily Lorretta Harp, MD   81 mg at 10/06/19 0948  . atorvastatin (LIPITOR) tablet 80 mg  80 mg Oral Daily Lorretta Harp, MD   80 mg at 10/06/19 0948  . bisacodyl (DULCOLAX) suppository 10 mg  10 mg Rectal Daily PRN Lorretta Harp, MD      . carvedilol  (COREG) tablet 12.5 mg  12.5 mg Oral BID WC Geralynn Rile, MD   12.5 mg at 10/06/19 1647  . docusate sodium (COLACE) capsule 100 mg  100 mg Oral BID Lorretta Harp, MD   100 mg at 10/02/19 2222  . empagliflozin (JARDIANCE) tablet 10 mg  10 mg Oral Daily Matilde Haymaker, MD   10 mg at 10/06/19 1239  . feeding supplement (ENSURE ENLIVE) (ENSURE ENLIVE) liquid 237 mL  237 mL Oral TID BM Lorretta Harp, MD   237 mL at 10/05/19 1430  . feeding supplement (PRO-STAT SUGAR FREE 64) liquid 30 mL  30 mL Oral TID Lorretta Harp, MD   30 mL at 10/06/19 0207  . furosemide (LASIX) tablet 40 mg  40 mg Oral Daily Geralynn Rile, MD   40 mg at 10/06/19 0949  . insulin aspart (novoLOG) injection 0-15 Units  0-15 Units Subcutaneous TID WC Lorretta Harp, MD   3 Units at 10/06/19 1646  . insulin aspart (novoLOG) injection 0-5 Units  0-5 Units Subcutaneous QHS Lorretta Harp, MD   2 Units at 10/04/19 2048  . insulin glargine (LANTUS) injection 30 Units  30 Units Subcutaneous QHS Gifford Shave, MD   30 Units at 10/05/19 2135  . ipratropium-albuterol (DUONEB) 0.5-2.5 (3) MG/3ML nebulizer solution 3 mL  3 mL Nebulization Q6H PRN Chambliss, Jeb Levering, MD      . losartan (COZAAR) tablet 25 mg  25 mg Oral Daily Lorretta Harp, MD   25 mg at 10/06/19 0948  . magnesium citrate solution 1 Bottle  1 Bottle Oral Once PRN Lorretta Harp, MD      . metoCLOPramide (REGLAN) tablet 5-10 mg  5-10 mg Oral Q8H PRN Lorretta Harp, MD   10 mg at 09/30/19 0426   Or  . metoCLOPramide (REGLAN) injection 5-10 mg  5-10 mg Intravenous Q8H PRN Lorretta Harp, MD      . multivitamin with minerals tablet 1 tablet  1 tablet Oral Daily Lorretta Harp, MD   1 tablet at 10/06/19 0948  . ondansetron (ZOFRAN) tablet 4 mg  4 mg Oral Q6H PRN Lorretta Harp, MD       Or  . ondansetron [facility]) injection 4 mg  4 mg Intravenous Q6H PRN Lorretta Harp, MD      . oxyCODONE (Oxy IR/ROXICODONE) immediate  release tablet 5 mg  5 mg Oral Q4H PRN Matilde Haymaker, MD      . polyethylene glycol [facility] / GLYCOLAX) packet 17 g  17 g Oral Daily Milus Banister C, DO      . ramelteon (ROZEREM) tablet 8 mg  8 mg Oral QHS Lorretta Harp, MD   8 mg at 10/05/19 2137  . spironolactone (ALDACTONE) tablet 12.5 mg  12.5 mg Oral Daily Lorretta Harp, MD  12.5 mg at 10/06/19 6945     Discharge Medications: Please see discharge summary for a list of discharge medications.  Relevant Imaging Results:  Relevant Lab Results:   Additional Information SSN [id number]  Vinie Sill, LCSWA

## 2019-10-06 NOTE — TOC Benefit Eligibility Note (Signed)
Transition of Care [facility]) Benefit Eligibility Note    Patient Details  Name: Rodney [name] MRN: [id number] Date of Birth: 02-12-1951   Medication/Dose: JARDIANCE  10 MG PO DAILY     EMPAGLIFLOZIN: NON-FORMULARY  Covered?: Yes  Tier: 3 Drug  Prescription Coverage Preferred Pharmacy: [facility]  [facility]   [facility]  Spoke with Person/Company/Phone Number:: KEIOSHA  @ [employer] # [phone]  Co-Pay: $312.00  Prior Approval: No  Deductible: Unmet (OUT-OF-POCKET: UNMET)  Additional Notes: ELIQUIS  5 MG BID COVER- YES  COP-AY- $312.00    TIER-3 DRUG  P/A-NO    Memory Argue Phone Number: 10/06/2019, 12:25 PM

## 2019-10-06 NOTE — Progress Notes (Addendum)
Family Medicine Teaching Service Daily Progress Note Intern Pager: [phone]  Patient name: Rodney [name] Medical record number: [id number] Date of birth: [birth year] Age: 69 y.o. Gender: male  Primary Care Provider: [name], MD Consultants: Orthopedics Code Status: Full code  Pt Overview and Major Events to Date:   Assessment and Plan: Rodney [name] is a 69 y.o. male presenting with left foot cellulitis/ulceration. PMH is significant for hypertension, hyperlipidemia, tobacco abuse, type 2 diabetes, dyspnea, peripheral edema.  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  WBCs stable at 10.3 on 6/9, on admission was 17.  Remains afebrile, vitals stabilizing. -Orthopedics consulted, appreciate recommendations -Patient has completed antibiotic regimen -PT/OT eval and treat - recommend CIR versus SNF for d/c -CIR is consulted and are now working on expedited appeal for CIR approval. -Vitals per routine -Morning CBCs -Tylenol and oxycodone as needed for pain, decreasing oxycodone dose  Acute Hypoxic respiratory failure secondary to pneumonia and new onset heart failure with reduced EF (acute).  Patient is satting well on room air, no respiratory distress -Antibiotic regimen completed -40 Lasix daily  -Cardiology following, appreciate their recommendations   Acute heart failure in the setting of new diagnosis of HFrEF, stable Patient had echo 6/3 showing LVEF 35-40%. RV systolic function is mildly reduced, both atria mildly dilated.  Left heart cath showed 100% stenosis of proximal RCA -Cardiology consulted, appreciate recommendations -Continue Lasix 40 mg daily -Continue aspirin -Discontinue metoprolol -Losartan 25 mg daily -Initiated Coreg 12.5 mg -Continue Aldactone 12.5 mg daily twice daily -Continue smoking cessation counseling -Strict I's and O's  New onset Atrial Fibrillation  CHADS2VASC is  5.  EKG on 6/6 significant for a fib with RVR. Denies any chest pain of SOB.  Cardiology consulted, appreciate recs.   -Cardiology consulted, appreciate recommendations -Lasix 40 mg daily -1 month course of amiodarone, 40 mg twice daily for 1 week and then 200 mg daily for 3 weeks. -Discontinued metoprolol and started Coreg 12.5 mg twice daily -TEE/DCCV converted to sinus rhythm on 6/10 -Eliquis 5 mg twice daily -Heart healthy carb modified diet  T2DM with neuropathy  PVD, stable Hemoglobin A1c 09/21/2019 was 8.7.  CBG this a.m. 158.  Stable.  Medications include Novolin 70/30 75 units in the morning and 55 units in the evening, Metformin 1000 mg twice daily -Hold patient's home insulin, Metformin -Lantus 20 units last night, will increase to 30 units after TEE -Moderate SSI -CBG monitoring  -Vascular Arterial Duplex of LE ordered to evaluate PVD  Hyponatremia, 132 6/5.  Most likely due to hypervolemia, improving with diuresis.  -Monitor with diuresis.   Anemia, in part IDA with low iron saturations, ferritin, likely multifactorial.  Secondary to acute blood loss from surgery and underlying chronic disease. Normocytic decreased iron at 23 with normal TIBC and decreased saturation radiates.  Vitamin B-12 levels are elevated at 928.  Reticulocyte panel shows increased immature reticulocyte fraction. -Feraheme given 6/6.   AKI, improving Cr 2.08 on admission, BL ~1.1, today 1.1 -Morning BMPs -KVO IVF -Avoid nephrotoxic agents -Holding lisinopril-HCTZ combo -Lasix 40 mg daily -Losartan 25 mg daily  History of alcohol use disorder Patient declined alcohol use on admission; however, patient's PCP reported otherwise.  CIWA is overnight 0, no Ativan on board. -Monitor CIWA's -Call for Ativan if CIWA's >8.  Peripheral edema, resolved Medications include furosemide 40 mg daily -Giving Lasix 40 mg daily  -Strict I's and O's  Hyperlipidemia, chronic, stable Home  medication includes  atorvastatin 40 mg daily. Patient's 10-year ASCVD risk 41.1%. -Continue home atorvastatin 40 mg qHS  Hypertension, well controlled Home medication includes Lisinopril-HCTZ 20-25 mg daily.  Blood pressures stable. -Holding lisinopril-HCTZ at this time in the setting of hypotension and AKI -Continue Lasix 40 mg daily  History of tobacco abuse Patient reportedly recently quit smoking -Recommend continued cessation  FEN/GI: Carb modified diet, n.p.o. at midnight PPx: Heparin gtt   Disposition: TEE/DCCV 6/10, possibly CIR for SNF after  Subjective:  Patient reports he is doing well this morning and wants to move to the neck steps.  He said that a ramp is being built at his house now from a church.  Denies any chest pain or shortness of breath.  Objective: Temp:  [97.4 F (36.3 C)-98.6 F (37 C)] 98.6 F (37 C) (06/11 0347) Pulse Rate:  [80-97] 80 (06/11 0347) Resp:  [15-23] 18 (06/11 0347) BP: (88-139)/(52-82) 115/62 (06/11 0347) SpO2:  [92 %-97 %] 92 % (06/11 0347) Weight:  [110 kg] 110 kg (06/11 0347) Physical Exam: General: Well-appearing, laying in bed comfortably, no acute distress Cardiovascular: Regular rate, 2/6 murmur heard best at the left renal border Respiratory: Normal work of breathing, no O2 needed at this time Extremities: Right lower extremity edema resolved.. Patient is s/p left BKA, bandages intact with wound VAC in place, no drainage  Laboratory: Recent Labs  Lab 10/02/19 0334 10/03/19 0333 10/04/19 0308  WBC 10.0 10.0 10.3  HGB 9.5* 10.3* 10.3*  HCT 30.5* 32.3* 32.0*  PLT 522* 514* 513*   Recent Labs  Lab 10/04/19 0308 10/05/19 0325 10/06/19 0331  NA 136 133* 135  K 4.1 4.0 4.2  CL 98 97* 97*  CO2 29 29 30   BUN 21 25* 23  CREATININE 1.01 0.99 1.10  CALCIUM 9.0 8.8* 9.0  GLUCOSE 163* 219* 219*   Imaging/Diagnostic Tests: ECHO TEE  Result Date: 10/05/2019    TRANSESOPHOGEAL ECHO REPORT   Patient Name:   Rodney [name] Date of Exam:  10/05/2019 Medical Rec #:  [id number]         Height:       72.0 in Accession #:    [id number]        Weight:       238.1 lb Date of Birth:  12-31-1950        BSA:          2.294 m Patient Age:    44 years          BP:           139/65 mmHg Patient Gender: M                 HR:           89 bpm. Exam Location:  Inpatient Procedure: Transesophageal Echo and Cardiac Doppler Indications:     Atrial fibrillation  History:         Patient has prior history of Echocardiogram examinations, most                  recent 09/28/2019. CAD, Mitral Valve Disease, Arrythmias:Atrial                  Fibrillation; Risk Factors:obesity.  Sonographer:     Dustin Flock Referring Phys:  [id number] [place] Diagnosing Phys: Sanda Klein MD PROCEDURE: The transesophogeal probe was passed without difficulty through the esophogus of the patient. Sedation performed by performing physician. The patient was monitored while  under deep sedation. Anesthestetic sedation was provided intravenously by  Anesthesiology: 434.18mg  of Propofol. The patient developed Respiratory depression during the procedure. Abbreviated study due to hypoventilation/hypoxia. Dilated left atrium without evidence of left atrial thrombus. Moderately depressed left ventricular systolic function. Degenerative/calcific aortic valve changes with probably moderate stenosis (trileaflet valve). 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 No pericardial effusion.Dani Gobble Croitoru MD Electronically signed by Sanda Klein MD Signature Date/Time: 10/05/2019/3:59:33 PM    Final      Gifford Shave, MD 10/06/2019, 6:13 AM PGY-1, [place] Intern pager: [phone], text pages welcome

## 2019-10-07 DIAGNOSIS — I5041 Acute combined systolic (congestive) and diastolic (congestive) heart failure: Secondary | ICD-10-CM

## 2019-10-07 DIAGNOSIS — E1169 Type 2 diabetes mellitus with other specified complication: Secondary | ICD-10-CM

## 2019-10-07 DIAGNOSIS — E7800 Pure hypercholesterolemia, unspecified: Secondary | ICD-10-CM

## 2019-10-07 LAB — GLUCOSE, CAPILLARY
Glucose-Capillary: 151 mg/dL — ABNORMAL HIGH (ref 70–99)
Glucose-Capillary: 156 mg/dL — ABNORMAL HIGH (ref 70–99)
Glucose-Capillary: 154 mg/dL — ABNORMAL HIGH (ref 70–99)
Glucose-Capillary: 169 mg/dL — ABNORMAL HIGH (ref 70–99)

## 2019-10-07 LAB — SARS CORONAVIRUS 2 (TAT 6-24 HRS): SARS Coronavirus 2: NEGATIVE

## 2019-10-07 NOTE — Progress Notes (Addendum)
Family Medicine Teaching Service Daily Progress Note Intern Pager: [phone]  Patient name: Rodney [name] Medical record number: [id number] Date of birth: Apr 19, 1951 Age: 69 y.o. Gender: male  Primary Care Provider: [name], MD Consultants: Orthopedics Code Status: Full code  Pt Overview and Major Events to Date:   Assessment and Plan: Rodney [name] is a 69 y.o. male presenting with left foot cellulitis/ulceration. PMH is significant for hypertension, hyperlipidemia, tobacco abuse, type 2 diabetes, dyspnea, peripheral edema.  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  WBCs stable at 10.3 on 6/9, on admission was 17.  Remains afebrile, vitals stabilizing. -Orthopedics consulted, appreciate recommendations -Patient has completed antibiotic regimen -PT/OT eval and treat - recommend CIR versus SNF for d/c -CIR was denied per patient's insurance, now reaching out for SNF placement -Vitals per routine -CBCs every 2-3 days -Tylenol and oxycodone as needed for pain, decreasing oxycodone dose  Acute Hypoxic respiratory failure secondary to pneumonia and new onset heart failure with reduced EF (acute).  Patient is satting well on room air, no respiratory distress -Antibiotic regimen completed -40 Lasix daily  -Cardiology following, appreciate their recommendations   Acute systolic and diastolic heart failure in the setting of new diagnosis of HFrEF, stable Patient had echo 6/3 showing LVEF 35-40%. RV systolic function is mildly reduced, both atria mildly dilated.  Left heart cath showed 100% stenosis of proximal RCA.  Weight today is 108.3 kg -Cardiology consulted, appreciate recommendations -Continue Lasix 40 mg daily -Continue aspirin -Losartan 25 mg daily -Initiated Coreg 12.5 mg -Continue Aldactone 12.5 mg daily twice daily -Continue smoking cessation counseling -Strict I's and O's  New  onset Atrial Fibrillation  CHADS2VASC is 5.  EKG on 6/6 significant for a fib with RVR.  Denies any chest pain or shortness of breath this morning. Cardiology consulted, appreciate recs.   -Cardiology consulted, appreciate recommendations -Lasix 40 mg daily -1 month course of amiodarone, 40 mg twice daily for 1 week and then 200 mg daily for 3 weeks. -Continue Coreg 12.5 mg twice daily -TEE/DCCV converted to sinus rhythm on 6/10 -Eliquis 5 mg twice daily -Heart healthy carb modified diet  T2DM with neuropathy  PVD, stable Hemoglobin A1c 09/21/2019 was 8.7.  CBG this a.m. 158.  Stable.  Medications include Novolin 70/30 75 units in the morning and 55 units in the evening, Metformin 1000 mg twice daily -Hold patient's home insulin, Metformin -Lantus 30 units daily -Jardiance -Moderate SSI -CBG monitoring  -Vascular Arterial Duplex of LE ordered to evaluate PVD  Hyponatremia, 135 on 6/11.  Most likely due to hypervolemia on admission,.  Resolved -Monitor with BMPs every 2 to 3 days  Anemia, in part IDA with low iron saturations, ferritin, likely multifactorial.  Secondary to acute blood loss from surgery and underlying chronic disease. Normocytic decreased iron at 23 with normal TIBC and decreased saturation radiates.  Vitamin B-12 levels are elevated at 928.  Reticulocyte panel shows increased immature reticulocyte fraction. -Feraheme given 6/6.   AKI, improving Cr 2.08 on admission, BL ~1.1, today 1.1 -BMP every 2 to 3 days -KVO IVF -Avoid nephrotoxic agents -Holding lisinopril-HCTZ combo -Lasix 40 mg daily -Losartan 25 mg daily  History of alcohol use disorder Patient declined alcohol use on admission; however, patient's PCP reported otherwise.  CIWA is overnight 0, no Ativan on board. -Monitor CIWA's -Call for Ativan if CIWA's >8.  Peripheral edema, resolved Medications include furosemide 40 mg daily -Giving Lasix  40 mg daily  -Strict I's and O's  Hyperlipidemia,  chronic, stable Home medication includes atorvastatin 40 mg daily. Patient's 10-year ASCVD risk 41.1%. -Increase atorvastatin to 80 mg daily per cards recs -LDL goal <70  Hypertension, well controlled Home medication includes Lisinopril-HCTZ 20-25 mg daily.  Blood pressures stable. -Holding lisinopril-HCTZ at this time in the setting of hypotension and AKI -Continue Lasix 40 mg daily  History of tobacco abuse Patient reportedly recently quit smoking -Recommend continued cessation  FEN/GI: Regular diet PPx: SCDs  Disposition: CAR denied, pending SNF approval patient is medically stable for SNF  Subjective:  Patient doing well this morning.  Disappointed that he will not be able to go to CIR but is okay with SNF placement.  Reports that someone is building a ramp at his house.  Objective: Temp:  [97.4 F (36.3 C)-98.2 F (36.8 C)] 97.8 F (36.6 C) (06/12 0827) Pulse Rate:  [76-83] 83 (06/12 0827) Resp:  [15-22] 18 (06/12 0827) BP: (100-125)/(64-82) 122/77 (06/12 0827) SpO2:  [91 %-97 %] 95 % (06/12 0827) Weight:  [108.3 kg] 108.3 kg (06/12 0442) Physical Exam: General: In no acute distress, laying in bed comfortably Cardiovascular: Regular rate, patient has 2/6 systolic murmur heard best at left sternal border Respiratory: Normal work of breathing at this time Extremities: Right lower extremity edema resolved.. Patient is s/p left BKA, bandages intact with wound VAC in place, no drainage  Laboratory: Recent Labs  Lab 10/02/19 0334 10/03/19 0333 10/04/19 0308  WBC 10.0 10.0 10.3  HGB 9.5* 10.3* 10.3*  HCT 30.5* 32.3* 32.0*  PLT 522* 514* 513*   Recent Labs  Lab 10/04/19 0308 10/05/19 0325 10/06/19 0331  NA 136 133* 135  K 4.1 4.0 4.2  CL 98 97* 97*  CO2 29 29 30   BUN 21 25* 23  CREATININE 1.01 0.99 1.10  CALCIUM 9.0 8.8* 9.0  GLUCOSE 163* 219* 219*   Imaging/Diagnostic Tests: No results found.   Gifford Shave, MD 10/07/2019, 8:45 AM PGY-1, [place] Intern pager: [phone], text pages welcome

## 2019-10-07 NOTE — TOC Progression Note (Addendum)
Transition of Care [facility]) - Progression Note    Patient Details  Name: Rodney [name] MRN: [id number] Date of Birth: 04/27/1951  Transition of Care [facility] CM/SW [place], [place] Phone Number: [phone] 10/07/2019, 1:02 PM  Clinical Narrative:     CSW spoke with Juliann Pulse at [facility]. She stated that they do have a bed for patient however he would need a new COVID test. CSW followed up with [facility] and authorization has been approved as well as facility name given. Authorization # [id number] from 6/12-6/15. Authorization information was provided to facility.  CSW updated patient about SNF updates and he is in agreement.  CSW alert RN that facility had been secured.  2;15pm- Spoke with Juliann Pulse again and facility is unable to accept patient until Monday morning.  MD and RN alerted of discharge status.  TOC team will continue to assit with discharge planning needs.  Expected Discharge Plan: (P) Skilled Nursing Facility Barriers to Discharge: (P) Continued Medical Work up  Expected Discharge Plan and Services Expected Discharge Plan: (P) [place] In-house Referral: (P) NA Discharge Planning Services: CM Consult Post Acute Care Choice: (P) IP Rehab Living arrangements for the past 2 months: Single Family Home                                       Social Determinants of Health (SDOH) Interventions    Readmission Risk Interventions No flowsheet data found.

## 2019-10-07 NOTE — Progress Notes (Signed)
Progress Note  Patient Name: Rodney [name] Date of Encounter: 10/07/2019  Rodney [name]: Pixie Casino, MD   Subjective   Has remained in sinus rhythm since cardioversion. No acute events overnight. Awaiting rehab placement. Breathing is stable, no chest pain.  Inpatient Medications    Scheduled Meds: . acetaminophen  650 mg Oral Q6H  . amiodarone  400 mg Oral BID   Followed by  . [START ON 10/13/2019] amiodarone  200 mg Oral Daily  . apixaban  5 mg Oral BID  . aspirin  81 mg Oral Daily  . atorvastatin  80 mg Oral Daily  . carvedilol  12.5 mg Oral BID WC  . docusate sodium  100 mg Oral BID  . empagliflozin  10 mg Oral Daily  . feeding supplement (ENSURE ENLIVE)  237 mL Oral TID BM  . feeding supplement (PRO-STAT SUGAR FREE 64)  30 mL Oral TID  . furosemide  40 mg Oral Daily  . insulin aspart  0-15 Units Subcutaneous TID WC  . insulin aspart  0-5 Units Subcutaneous QHS  . insulin glargine  30 Units Subcutaneous QHS  . losartan  25 mg Oral Daily  . multivitamin with minerals  1 tablet Oral Daily  . polyethylene glycol  17 g Oral Daily  . ramelteon  8 mg Oral QHS  . spironolactone  12.5 mg Oral Daily   Continuous Infusions: . sodium chloride Stopped (09/28/19 1359)   PRN Meds: bisacodyl, ipratropium-albuterol, magnesium citrate, metoCLOPramide **OR** metoCLOPramide (REGLAN) injection, ondansetron **OR** ondansetron (ZOFRAN) IV, oxyCODONE   Vital Signs    Vitals:   10/06/19 2000 10/06/19 2357 10/07/19 0442 10/07/19 0827  BP: 123/74 111/64 100/82 122/77  Pulse: 80 82 78 83  Resp: (!) 21 (!) _0 Temp: (!) 97.4 F (36.3 C) 98.2 F (36.8 C) 97.9 F (36.6 C) 97.8 F (36.6 C)  TempSrc: Oral Oral Oral Oral  SpO2: 96% 91% 97% 95%  Weight:   108.3 kg   Height:        Intake/Output Summary (Last 24 hours) at 10/07/2019 0903 Last data filed at 10/07/2019 0827 Gross per 24 hour  Intake 240 ml  Output 1872 ml  Net -1632 ml   Last 3 Weights  10/07/2019 10/06/2019 10/05/2019  Weight (lbs) 238 lb 12.1 oz 242 lb 8.1 oz 238 lb 1.6 oz  Weight (kg) 108.3 kg 110 kg 108.001 kg      Telemetry    NSR - Personally Reviewed  ECG    SR with PACs at 78 bpm, nonspecific ST changes - Personally Reviewed  Physical Exam   GEN: No acute distress.   Neck: No JVD Cardiac: regular S1 and S2, 3/6 SEM, no rubs, or gallops.  Respiratory: Clear to auscultation bilaterally. GI: Soft, nontender, non-distended  MS: No edema; s/p L BKA Neuro:  Nonfocal  Psych: Normal affect   Labs    High Sensitivity Troponin:  No results for input(s): TROPONINIHS in the last 720 hours.    Chemistry Recent Labs  Lab 10/04/19 0308 10/05/19 0325 10/06/19 0331  NA 136 133* 135  K 4.1 4.0 4.2  CL 98 97* 97*  CO2 _1 GLUCOSE 163* 219* 219*  BUN 21 25* 23  CREATININE 1.01 0.99 1.10  CALCIUM 9.0 8.8* 9.0  GFRNONAA >60 >60 >60  GFRAA >60 >60 >60  ANIONGAP _2 Hematology Recent Labs  Lab 10/02/19 0334 10/03/19 0333 10/04/19 0308  WBC  10.0 10.0 10.3  RBC 3.23* 3.49* 3.47*  HGB 9.5* 10.3* 10.3*  HCT 30.5* 32.3* 32.0*  MCV 94.4 92.6 92.2  MCH 29.4 29.5 29.7  MCHC 31.1 31.9 32.2  RDW 13.1 13.2 13.3  PLT 522* 514* 513*    BNPNo results for input(s): BNP, PROBNP in the last 168 hours.   DDimer No results for input(s): DDIMER in the last 168 hours.   Radiology    ECHO TEE  Result Date: 10/05/2019    TRANSESOPHOGEAL ECHO REPORT   Patient Name:   Rodney [name] Date of Exam: 10/05/2019 Medical Rec #:  [id number]         Height:       72.0 in Accession #:    [id number]        Weight:       238.1 lb Date of Birth:  [birth year]        BSA:          2.294 m Patient Age:    69 years          BP:           139/65 mmHg Patient Gender: M                 HR:           89 bpm. Exam Location:  Inpatient Procedure: Transesophageal Echo and Cardiac Doppler Indications:     Atrial fibrillation  History:         Patient has prior history of  Echocardiogram examinations, most                  recent 09/28/2019. CAD, Mitral Valve Disease, Arrythmias:Atrial                  Fibrillation; Risk Factors:obesity.  Sonographer:     Dustin Flock Referring Phys:  [id number] [place] Diagnosing Phys: Sanda Klein MD PROCEDURE: The transesophogeal probe was passed without difficulty through the esophogus of the patient. Sedation performed by performing physician. The patient was monitored while under deep sedation. Anesthestetic sedation was provided intravenously by  Anesthesiology: 434.51m of Propofol. The patient developed Respiratory depression during the procedure. Abbreviated study due to hypoventilation/hypoxia. Dilated left atrium without evidence of left atrial thrombus. Moderately depressed left ventricular systolic function. Degenerative/calcific aortic valve changes with probably moderate stenosis (trileaflet valve). 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 No pericardial effusion..Dani GobbleCroitoru MD Electronically signed by MSanda KleinMD Signature Date/Time: 10/05/2019/3:59:33 PM    Final     Cardiac Studies   TTE 09/28/2019 1. Assessment of his LV function is difficult. Consider repeat echo with  definity contrast once HR is slower . .Marland KitchenLeft ventricular ejection  fraction, by estimation, is 35 to 40%. The left ventricle has moderately  decreased function. The left ventricle  demonstrates global hypokinesis. Left ventricular diastolic parameters are  indeterminate.  2. Right ventricular systolic function is mildly reduced. The right  ventricular size is normal.  3. Left atrial size was mildly dilated.  4. Right atrial size was mildly dilated.  5. The mitral valve is normal in structure. Mild mitral valve  regurgitation. No evidence of mitral stenosis.  6. The aortic valve is not well visualized in the short axis view but is  likely a 3 leaflet valve ..Marland KitchenThe aortic valve is tricuspid. Aortic valve   regurgitation is not visualized. Moderate aortic valve stenosis.   LHC 10/03/2019 Mr. BThomahas mild nonobstructive disease  in his left system including the AV groove circumflex and LAD with a chronically occluded RCA, faint grade 1 left to right collaterals and a 30 mm peak to peak gradient across his aortic valve.  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  His LVEDP was 35.  At this point, I would recommend medical therapy.  Patient Profile     69 y.o. male with type II diabetes, hypertension, CAD who was admitted for sepsis secondary to LE infection, s/p left BKA. Hospital course complicated by acute systolic and diastolic heart failure and atrial fibrillation.  Assessment & Plan    Acute systolic and diastolic heart failure, ejection fraction 35-40% -Coronary angiography with CTO of RCA.  Minimal disease in LAD and left circumflex. 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 fib after his left BKA -Status post TEE cardioversion on 10/05/2019 -continue apixaban 5 mg BID -CHA2DS2/VAS Stroke Risk Points=4 -see Dr. Kathalene Frames note, planned for 1 month of amiodarone: 40 mg twice daily for 1 week and then 200 mg daily for 3 weeks.    CAD, CTO RCA -no chest pain -continue aspirin (monitor for bleeding given apixaban) -continue carvedilol, empagliflozin -statin as below  Hypercholesterolemia -continue atorvastatin 80 mg, recheck lipids as outpatient. LDL goal <70, last 48 (was on 40 mg atorvastatin as an outpatient)  Moderate aortic stenosis -We will follow this as an outpatient  Type II  diabetes, on insulin: With heart failure and CAD, agree with SGLT2i (empagliflozin)  [facility] will sign off.   Medication Recommendations:  As listed above (lasix, spironolactone, empagliflozin, apixaban, amiodarone, carvedilol, losartan, aspirin) Other recommendations (labs, testing, etc):  none Follow up as an outpatient:  We will arrange for follow up with Dr. Audie Box post discharge  For questions or updates, please contact [facility] Please consult [email or web address] for contact info under        Signed, Buford Dresser, MD  10/07/2019, 9:03 AM

## 2019-10-08 LAB — GLUCOSE, CAPILLARY
Glucose-Capillary: 142 mg/dL — ABNORMAL HIGH (ref 70–99)
Glucose-Capillary: 158 mg/dL — ABNORMAL HIGH (ref 70–99)
Glucose-Capillary: 217 mg/dL — ABNORMAL HIGH (ref 70–99)

## 2019-10-08 NOTE — Progress Notes (Signed)
Family Medicine Teaching Service Daily Progress Note Intern Pager: [phone]  Patient name: Rodney [name] Medical record number: [id number] Date of birth: 07-04-1950 Age: 69 y.o. Gender: male  Primary Care Provider: [name], MD Consultants: ortho Code Status: full code  Pt Overview and Major Events to Date:  Admitted to [place] 09/26/19 Left transtibial amputation & application of prevena wound vac 6/3  Assessment and Plan: Rodney [name] a 68 y.o.malepresenting with left foot cellulitis/ulceration. PMH is significant forhypertension, hyperlipidemia, tobacco abuse, type 2 diabetes, dyspnea, peripheral edema.  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 S/p linezolid, zosyn, vanc, clindamycin, and cefazolin. Now completed all antibiotics. Afebrile. Denies any pain.  -ortho consulted, appreciate recommendations.  -PT/OT -CBC q2-3 days, ordered for 6/14 -pain control: tylenol q6h, oxycodone 5mg  q4h PRN. Last oxycodone use use 6/10, can likely dc now that patients pain is better controlled   Acute Hypoxic respiratory failure secondary to pneumonia and new onset heart failure with reduced EF (acute).   Currently satting 92% on room air. Denies any SOB.  -continue PO lasix -cards following: have s/o  Acute systolic and diastolic heart failure in the setting of new diagnosis of HFrEF, stable Patient had echo 6/3 showing LVEF 35-40%. RV systolic function is mildly reduced, both atria mildly dilated.  Left heart cath showed 100% stenosis of proximal RCA.  Weight today is 103.9 kg. Total of 1.87L output on 6/12. Denies SOB or CP.  -Cardiology consulted, appreciate recommendations: have s/o -Continue Lasix 40 mg daily -Continue aspirin -Losartan 25 mg daily -continue Coreg 12.5 mg -Continue Aldactone 12.5 mg daily twice daily -Continue smoking cessation counseling -Strict I's and O's  New onset Atrial Fibrillation   CHADS2VASC is 5. TEE/DCCV converted to sinus rhythm on 6/10 -Cardiology consulted, appreciate recommendations -1 month course of amiodarone, 40 mg twice daily for 1 week and then 200 mg daily for 3 weeks. -Continue Coreg 12.5 mg twice daily -Eliquis 5 mg twice daily  T2DM with neuropathy  PVD, stable Hemoglobin A1c 09/21/2019 was 8.7.  Last CBG on 6/12 of 169. Home meds: Novolin 70/30 75 units in the morning and 55 units in the evening, Metformin 1000 mg twice daily -Hold patient's home insulin, Metformin -Lantus 30 units daily -Jardiance -Moderate SSI -CBG monitoring   Hyponatremia-resolved -Monitor with BMPs every 2 to 3 days, ordered for 6/14  Anemia Likely multifactorial. Feraheme given 6/6.  -monitor on CBCs  AKI, improving -BMP every 2 to 3 days -Avoid nephrotoxic agents -Holding lisinopril-HCTZ combo  History of alcohol use disorder CIWA is overnight 0  -Monitor CIWA's.  Peripheral edema, resolved Medications include furosemide 40 mg daily -continue Lasix 40 mg daily  -Strict I's and O's  Hyperlipidemia, chronic, stable Home medication includes atorvastatin 40 mg daily. Patient's 10-year ASCVD risk 41.1%. LDL goal <70 -continue atorvastatin to 80 mg daily per cards recs  Hypertension, well controlled Home medication includes Lisinopril-HCTZ 20-25 mg daily.  BP currently normotensive with BP 119/69.  -Holding lisinopril-HCTZ at this time in the setting of hypotension and AKI -Continue Lasix 40 mg daily -continue spironolactone 12.5mg  daily -continue losartan  -continue carvedilol   History of tobacco abuse Patient reportedly recently quit smoking -Recommend continued cessation  FEN/GI: Regular diet PPx: SCDs  Disposition: medically stable, pending SNF placement  Subjective:  Patient states he overall is doing well, hopeful for dc soon. Denies CP, SOB, or any pain.   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

## 2019-10-09 DIAGNOSIS — R0902 Hypoxemia: Secondary | ICD-10-CM | POA: Diagnosis not present

## 2019-10-09 DIAGNOSIS — I5020 Unspecified systolic (congestive) heart failure: Secondary | ICD-10-CM | POA: Diagnosis not present

## 2019-10-09 DIAGNOSIS — E1140 Type 2 diabetes mellitus with diabetic neuropathy, unspecified: Secondary | ICD-10-CM | POA: Diagnosis not present

## 2019-10-09 DIAGNOSIS — I4821 Permanent atrial fibrillation: Secondary | ICD-10-CM | POA: Diagnosis not present

## 2019-10-09 DIAGNOSIS — Z89512 Acquired absence of left leg below knee: Secondary | ICD-10-CM | POA: Diagnosis not present

## 2019-10-09 DIAGNOSIS — A419 Sepsis, unspecified organism: Secondary | ICD-10-CM | POA: Diagnosis not present

## 2019-10-09 DIAGNOSIS — M2550 Pain in unspecified joint: Secondary | ICD-10-CM | POA: Diagnosis not present

## 2019-10-09 DIAGNOSIS — I739 Peripheral vascular disease, unspecified: Secondary | ICD-10-CM | POA: Diagnosis not present

## 2019-10-09 DIAGNOSIS — I10 Essential (primary) hypertension: Secondary | ICD-10-CM | POA: Diagnosis not present

## 2019-10-09 DIAGNOSIS — R6520 Severe sepsis without septic shock: Secondary | ICD-10-CM | POA: Diagnosis not present

## 2019-10-09 DIAGNOSIS — M25512 Pain in left shoulder: Secondary | ICD-10-CM | POA: Diagnosis not present

## 2019-10-09 DIAGNOSIS — Z7401 Bed confinement status: Secondary | ICD-10-CM | POA: Diagnosis not present

## 2019-10-09 DIAGNOSIS — E1142 Type 2 diabetes mellitus with diabetic polyneuropathy: Secondary | ICD-10-CM | POA: Diagnosis not present

## 2019-10-09 DIAGNOSIS — S81812A Laceration without foreign body, left lower leg, initial encounter: Secondary | ICD-10-CM | POA: Diagnosis not present

## 2019-10-09 DIAGNOSIS — R262 Difficulty in walking, not elsewhere classified: Secondary | ICD-10-CM | POA: Diagnosis not present

## 2019-10-09 DIAGNOSIS — I484 Atypical atrial flutter: Secondary | ICD-10-CM | POA: Diagnosis not present

## 2019-10-09 DIAGNOSIS — Z4781 Encounter for orthopedic aftercare following surgical amputation: Secondary | ICD-10-CM | POA: Diagnosis not present

## 2019-10-09 DIAGNOSIS — I5022 Chronic systolic (congestive) heart failure: Secondary | ICD-10-CM | POA: Diagnosis not present

## 2019-10-09 DIAGNOSIS — E43 Unspecified severe protein-calorie malnutrition: Secondary | ICD-10-CM | POA: Diagnosis not present

## 2019-10-09 DIAGNOSIS — N179 Acute kidney failure, unspecified: Secondary | ICD-10-CM | POA: Diagnosis not present

## 2019-10-09 DIAGNOSIS — E785 Hyperlipidemia, unspecified: Secondary | ICD-10-CM | POA: Diagnosis not present

## 2019-10-09 DIAGNOSIS — R2681 Unsteadiness on feet: Secondary | ICD-10-CM | POA: Diagnosis not present

## 2019-10-09 DIAGNOSIS — R5381 Other malaise: Secondary | ICD-10-CM | POA: Diagnosis not present

## 2019-10-09 DIAGNOSIS — M6281 Muscle weakness (generalized): Secondary | ICD-10-CM | POA: Diagnosis not present

## 2019-10-09 LAB — BASIC METABOLIC PANEL
Anion gap: 9 (ref 5–15)
BUN: 28 mg/dL — ABNORMAL HIGH (ref 8–23)
CO2: 28 mmol/L (ref 22–32)
Calcium: 9.4 mg/dL (ref 8.9–10.3)
Chloride: 97 mmol/L — ABNORMAL LOW (ref 98–111)
Creatinine, Ser: 1.48 mg/dL — ABNORMAL HIGH (ref 0.61–1.24)
GFR calc Af Amer: 56 mL/min — ABNORMAL LOW (ref >60)
GFR calc non Af Amer: 48 mL/min — ABNORMAL LOW (ref >60)
Glucose, Bld: 140 mg/dL — ABNORMAL HIGH (ref 70–99)
Potassium: 4.1 mmol/L (ref 3.5–5.1)
Sodium: 134 mmol/L — ABNORMAL LOW (ref 135–145)

## 2019-10-09 LAB — GLUCOSE, CAPILLARY
Glucose-Capillary: 226 mg/dL — ABNORMAL HIGH (ref 70–99)
Glucose-Capillary: 170 mg/dL — ABNORMAL HIGH (ref 70–99)
Glucose-Capillary: 160 mg/dL — ABNORMAL HIGH (ref 70–99)
Glucose-Capillary: 176 mg/dL — ABNORMAL HIGH (ref 70–99)

## 2019-10-09 LAB — CBC
HCT: 33.4 % — ABNORMAL LOW (ref 39.0–52.0)
Hemoglobin: 10.7 g/dL — ABNORMAL LOW (ref 13.0–17.0)
MCH: 29.3 pg (ref 26.0–34.0)
MCHC: 32 g/dL (ref 30.0–36.0)
MCV: 91.5 fL (ref 80.0–100.0)
Platelets: 320 10*3/uL (ref 150–400)
RBC: 3.65 MIL/uL — ABNORMAL LOW (ref 4.22–5.81)
RDW: 13.9 % (ref 11.5–15.5)
WBC: 9.8 10*3/uL (ref 4.0–10.5)
nRBC: 0 % (ref 0.0–0.2)

## 2019-10-09 MED ORDER — AMIODARONE HCL 200 MG PO TABS: 200.0000 mg | ORAL_TABLET | Freq: Every day | ORAL | 0 refills | Status: DC

## 2019-10-09 MED ORDER — LOSARTAN POTASSIUM 25 MG PO TABS: 25.0000 mg | ORAL_TABLET | Freq: Every day | ORAL | 0 refills | Status: DC

## 2019-10-09 MED ORDER — APIXABAN 5 MG PO TABS: 5.0000 mg | ORAL_TABLET | Freq: Two times a day (BID) | ORAL | 0 refills | Status: DC

## 2019-10-09 MED ORDER — FUROSEMIDE 20 MG PO TABS: 20.0000 mg | ORAL_TABLET | Freq: Every day | ORAL | Status: DC

## 2019-10-09 MED ORDER — FUROSEMIDE 20 MG PO TABS: 20.0000 mg | ORAL_TABLET | Freq: Every day | ORAL | 0 refills | Status: DC

## 2019-10-09 MED ORDER — ATORVASTATIN CALCIUM 80 MG PO TABS: 80.0000 mg | ORAL_TABLET | Freq: Every day | ORAL | 0 refills | Status: DC

## 2019-10-09 MED ORDER — CARVEDILOL 12.5 MG PO TABS: 12.5000 mg | ORAL_TABLET | Freq: Two times a day (BID) | ORAL | 0 refills | Status: DC

## 2019-10-09 MED ORDER — INSULIN GLARGINE 100 UNIT/ML ~~LOC~~ SOLN: 30.0000 [IU] | Freq: Every day | SUBCUTANEOUS | 11 refills | Status: DC

## 2019-10-09 MED ORDER — AMIODARONE HCL 400 MG PO TABS: 400.0000 mg | ORAL_TABLET | Freq: Two times a day (BID) | ORAL | 0 refills | Status: DC

## 2019-10-09 MED ORDER — SPIRONOLACTONE 25 MG PO TABS: 12.5000 mg | ORAL_TABLET | Freq: Every day | ORAL | 0 refills | Status: DC

## 2019-10-09 MED ORDER — EMPAGLIFLOZIN 10 MG PO TABS: 10.0000 mg | ORAL_TABLET | Freq: Every day | ORAL | 0 refills | Status: DC

## 2019-10-09 NOTE — Progress Notes (Signed)
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 Also with newly diagnosed systolic heart failure, LVEF 35-40% and at least moderate aortic stenosis. s/p cardioversion 6/10. PMH includes HTN, HLD, DM.    PT Comments    Pt was seen for bed ex's as this was the option he chose among suggestions and requests from PT.  He is expecting to leave for rehab soon, and is reporting SNF is taking him for therapy.  Follow with him acutely as needed depending on his willingness to perform therapy.  Gait would be preferable at next PT session.   Follow Up Recommendations  CIR     Equipment Recommendations  Wheelchair (measurements PT);Wheelchair cushion (measurements PT);Rolling walker with 5" wheels    Recommendations for Other Services Rehab consult     Precautions / Restrictions Precautions Precautions: Fall Precaution Comments: Lft residual limb wound vac Required Braces or Orthoses: Other Brace Other Brace: limb guard; declines using it Restrictions Weight Bearing Restrictions: Yes LLE Weight Bearing: Non weight bearing    Mobility  Bed Mobility Overal bed mobility: Needs Assistance Bed Mobility: Supine to Sit     Supine to sit: Modified independent (Device/Increase time);Supervision     General bed mobility comments: Does not want assistance with it  Transfers                 General transfer comment: declined today  Ambulation/Gait                 Stairs             Wheelchair Mobility    Modified Rankin (Stroke Patients Only)       Balance                                            Cognition Arousal/Alertness: Awake/alert Behavior During Therapy: WFL for tasks assessed/performed Overall Cognitive  Status: Within Functional Limits for tasks assessed                                        Exercises General Exercises - Lower Extremity Ankle Circles/Pumps: AROM;Right Quad Sets: AROM;10 reps Gluteal Sets: AROM;10 reps Heel Slides: AROM;10 reps Hip ABduction/ADduction: AROM;10 reps Straight Leg Raises: AROM;10 reps    General Comments        Pertinent Vitals/Pain Pain Assessment: No/denies pain    Home Living                      Prior Function            PT Goals (current goals can now be found in the care plan section) Acute Rehab PT Goals Patient Stated Goal: for you to leave me alone Progress towards PT goals: Not progressing toward goals - comment    Frequency    Min 3X/week      PT Plan Current plan remains appropriate    Co-evaluation              AM-PAC PT "6 Clicks" Mobility   Outcome Measure  Help needed turning from your back to  your side while in a flat bed without using bedrails?: None Help needed moving from lying on your back to sitting on the side of a flat bed without using bedrails?: A Little Help needed moving to and from a bed to a chair (including a wheelchair)?: A Little Help needed standing up from a chair using your arms (e.g., wheelchair or bedside chair)?: A Little Help needed to walk in hospital room?: A Little Help needed climbing 3-5 steps with a railing? : A Lot 6 Click Score: 18    End of Session Equipment Utilized During Treatment: Other (comment) (not on cannula) Activity Tolerance: Patient limited by fatigue;Other (comment) (not interested in doing any therapy OOB) Patient left: in bed;with call bell/phone within reach   PT Visit Diagnosis: Muscle weakness (generalized) (M62.81);Unsteadiness on feet (R26.81) Pain - Right/Left: Left Pain - part of body: Leg     Time: 6004-5997 PT Time Calculation (min) (ACUTE ONLY): 12 min  Charges:  $Therapeutic Exercise: 8-22 mins                    Ramond Dial 10/09/2019, 1:18 PM  Mee Hives, PT MS Acute Rehab Dept. Number: [place] and [place]

## 2019-10-09 NOTE — TOC Transition Note (Signed)
Transition of Care [facility]) - CM/SW Discharge Note   Patient Details  Name: Rodney [name] MRN: [id number] Date of Birth: 05-Jul-1950  Transition of [facility]) CM/SW Contact:  Vinie Sill, [place] Phone Number: 10/09/2019, 3:04 PM   Clinical Narrative:     Patient will DC to: [place] Date: 10/09/2019 Family Notified: [name], significant other  Transport By: Corey Harold   Per MD patient is ready for discharge. RN, patient, and facility notified of DC. Discharge Summary sent to facility. RN given number for [phone]. Ambulance transport requested for patient.   Clinical Social Worker signing off. Thurmond Butts, MSW, LCSWA Clinical Social Worker      Barriers to Discharge: (P) Continued Medical Work up   Patient Goals and CMS Choice Patient states their goals for this hospitalization and ongoing recovery are:: for now I have to get through the fact that I dont have a leg      Discharge Placement                       Discharge Plan and Services In-house Referral: (P) NA Discharge Planning Services: CM Consult Post Acute Care Choice: (P) IP Rehab                               Social Determinants of Health (SDOH) Interventions     Readmission Risk Interventions No flowsheet data found.

## 2019-10-09 NOTE — Progress Notes (Signed)
Pt discharged from unit with PTAR. [place] care given report  . Medication/discharge instruction given, VVS.  Phoebe Sharps, RN

## 2019-10-09 NOTE — Progress Notes (Signed)
  Family Medicine Teaching Service Daily Progress Note Intern Pager: [phone]  Patient name: Rodney [name] Medical record number: [id number] Date of birth: 03/19/1951 Age: 69 y.o. Gender: male  Primary Care Provider: [name], MD Consultants: Orthopedics Code Status: Full code  Pt Overview and Major Events to Date:   Assessment and Plan: Rodney [name] is a 69 y.o. male presenting with left foot cellulitis/ulceration. PMH is significant for hypertension, hyperlipidemia, tobacco abuse, type 2 diabetes, dyspnea, peripheral edema.  Patient medically stable for discharge. Discharge order in with patient waiting on transportation at this time   Left foot cellulitis/sepsis/osteomyelitis/necrotizing fasciitis - stable Status post-bka, medically stable. S/p antibiotics, f/u outpatient with ortho.  Newly diagnosed HFrEF - stable Euvolemic, continue newly started medications per cards, follow up with cardiology outpatient.  A Fib - stable Rate controlled, continue medications per cards, follow up with cardiology outpatient.  FEN/GI: Regular diet PPx: SCDs  Disposition: SNF  Subjective:  Patient without complaints, awaiting transportation  Objective: Temp:  [97.9 F (36.6 C)-98.1 F (36.7 C)] 98.1 F (36.7 C) (06/14 1657) Pulse Rate:  [62-80] 62 (06/14 1657) Resp:  [18-25] 18 (06/14 1657) BP: (93-131)/(60-75) 93/60 (06/14 1657) SpO2:  [91 %-97 %] 91 % (06/14 1657) Weight:  [103.4 kg] 103.4 kg (06/14 0609) Physical Exam: General: Alert and oriented in no apparent distress Heart: Regular rate and rhythm with no murmurs appreciated Lungs: CTA bilaterally, no wheezing Abdomen: Bowel sounds present, no abdominal pain Skin: Warm and dry Extremities: Left BKA, no right edema  Laboratory: Recent Labs  Lab 10/03/19 0333 10/04/19 0308 10/09/19 0458  WBC 10.0 10.3 9.8  HGB 10.3* 10.3* 10.7*  HCT 32.3* 32.0* 33.4*  PLT 514* 513* 320   Recent Labs  Lab  10/05/19 0325 10/06/19 0331 10/09/19 0458  NA 133* 135 134*  K 4.0 4.2 4.1  CL 97* 97* 97*  CO2 29 30 28   BUN 25* 23 28*  CREATININE 0.99 1.10 1.48*  CALCIUM 8.8* 9.0 9.4  GLUCOSE 219* 219* 140*   Imaging/Diagnostic Tests: No results found.   Lurline Del, DO 10/09/2019, 6:39 PM PGY-1, [place] Intern pager: [phone], text pages welcome

## 2019-10-09 NOTE — Discharge Instructions (Signed)
You are initially hospitalized for an infection of your left foot.  During her hospitalization, you had several new medical issues arise.  Here is a brief summary:  Left foot infection: For your left foot infection, there is necessary to amputate your foot.  While you were here, you finished a course of antibiotics.  Please follow-up with surgery and physical therapy.  Atrial fibrillation: During her time in the hospital, you are found to have an irregular heart rhythm called atrial fibrillation.  This is a potentially dangerous arrhythmia that causes her heart to beat very quickly.  While you are here we started you on a blood thinner to help avoid blood clots that can travel to your brain.  There is more information about this medication below.  We also started you on a medication to help restore your heart to a normal rhythm, this medication is called amiodarone.  Please make sure that you follow-up with your cardiologist see you take the appropriate dose of this medication.  Heart failure: An additional heart problem was noted while you are here called heart failure.  This means that your heart is working particularly hard to push bladder on your body.  You were started on several new medications for heart failure.  Will be important for you to follow-up with your primary care physician and your cardiologist for appropriate maintenance of this problem.  Information on my medicine - ELIQUIS (apixaban)  This medication education was reviewed with me or my healthcare representative as part of my discharge preparation.    Why was Eliquis prescribed for you? Eliquis was prescribed for you to reduce the risk of a blood clot forming that can cause a stroke if you have a medical condition called atrial fibrillation (a type of irregular heartbeat).  What do You need to know about Eliquis ? Take your Eliquis TWICE DAILY - one tablet in the morning and one tablet in the evening with or without food. If  you have difficulty swallowing the tablet whole please discuss with your pharmacist how to take the medication safely.  Take Eliquis exactly as prescribed by your doctor and DO NOT stop taking Eliquis without talking to the doctor who prescribed the medication.  Stopping may increase your risk of developing a stroke.  Refill your prescription before you run out.  After discharge, you should have regular check-up appointments with your healthcare provider that is prescribing your Eliquis.  In the future your dose may need to be changed if your kidney function or weight changes by a significant amount or as you get older.  What do you do if you miss a dose? If you miss a dose, take it as soon as you remember on the same day and resume taking twice daily.  Do not take more than one dose of ELIQUIS at the same time to make up a missed dose.  Important Safety Information A possible side effect of Eliquis is bleeding. You should call your healthcare provider right away if you experience any of the following: ? Bleeding from an injury or your nose that does not stop. ? Unusual colored urine (red or dark brown) or unusual colored stools (red or black). ? Unusual bruising for unknown reasons. ? A serious fall or if you hit your head (even if there is no bleeding).  Some medicines may interact with Eliquis and might increase your risk of bleeding or clotting while on Eliquis. To help avoid this, consult your healthcare provider or pharmacist prior  to using any new prescription or non-prescription medications, including herbals, vitamins, non-steroidal anti-inflammatory drugs (NSAIDs) and supplements.  This website has more information on Eliquis (apixaban): http://www.eliquis.com/eliquis/home

## 2019-10-13 ENCOUNTER — Ambulatory Visit (INDEPENDENT_AMBULATORY_CARE_PROVIDER_SITE_OTHER): Payer: Medicare PPO | Admitting: Physician Assistant

## 2019-10-13 ENCOUNTER — Other Ambulatory Visit: Payer: Self-pay

## 2019-10-13 ENCOUNTER — Encounter: Payer: Self-pay | Admitting: Physician Assistant

## 2019-10-13 VITALS — Ht 72.0 in | Wt 228.0 lb

## 2019-10-13 DIAGNOSIS — E1152 Type 2 diabetes mellitus with diabetic peripheral angiopathy with gangrene: Secondary | ICD-10-CM

## 2019-10-13 NOTE — Progress Notes (Signed)
Office Visit Note   Patient: Rodney [name]           Date of Birth: Mar 24, 1951           MRN: [id number] Visit Date: 10/13/2019              Requested by: [name], MD [address] [place],  [place] [address] PCP: [name], MD  Chief Complaint  Patient presents with  . Left Leg - Routine Post Op    09/29/2019 LBKA      HPI: Patient presents today 2 weeks status post left below-knee amputation.  He feels he is doing well he is currently in a nursing facility accompanied by family member  Assessment & Plan: Visit Diagnoses: No diagnosis found.  Plan: He may cleanse the area with soap and water.  The shrinker can be directly applied to his skin.  His family member will wash the other shrinker so he can change them daily.  Follow-up in a week for staple removal  Follow-Up Instructions: No follow-ups on file.   Ortho Exam  Patient is alert, oriented, no adenopathy, well-dressed, normal affect, normal respiratory effort. Focused examination demonstrates well-healing surgical incision.  Edges are well opposed minimal to no drainage.  No necrosis no cellulitis.  Swelling is well controlled  Imaging: No results found. No images are attached to the encounter.  Labs: Lab Results  Component Value Date   HGBA1C 9.2 (H) 09/26/2019   HGBA1C 8.7 (A) 09/21/2019   HGBA1C 9.0 (A) 06/22/2018   REPTSTATUS 10/01/2019 FINAL 09/26/2019   CULT  09/26/2019    NO GROWTH 5 DAYS Performed at [facility], [place] [address], [place], [place] [address]      Lab Results  Component Value Date   ALBUMIN 2.2 (L) 09/26/2019   ALBUMIN 3.9 02/21/2018   ALBUMIN 3.8 10/09/2015    No results found for: MG No results found for: VD25OH  No results found for: PREALBUMIN CBC EXTENDED Latest Ref Rng & Units 10/09/2019 10/04/2019 10/03/2019  WBC 4.0 - 10.5 K/uL 9.8 10.3 10.0  RBC 4.22 - 5.81 MIL/uL 3.65(L) 3.47(L) 3.49(L)  HGB 13.0 - 17.0 g/dL 10.7(L) 10.3(L) 10.3(L)    HCT 39 - 52 % 33.4(L) 32.0(L) 32.3(L)  PLT 150 - 400 K/uL 320 513(H) 514(H)  NEUTROABS 1.7 - 7.7 K/uL - - -  LYMPHSABS 0.7 - 4.0 K/uL - - -     Body mass index is 30.92 kg/m.  Orders:  No orders of the defined types were placed in this encounter.  No orders of the defined types were placed in this encounter.    Procedures: No procedures performed  Clinical Data: No additional findings.  ROS:  All other systems negative, except as noted in the HPI. Review of Systems  Objective: Vital Signs: Ht 6' (1.829 m)   Wt 227 lb 15.4 oz (103.4 kg)   BMI 30.92 kg/m   Specialty Comments:  No specialty comments available.  PMFS History: Patient Active Problem List   Diagnosis Date Noted  . Atypical atrial flutter ([place])   . Atrial fibrillation ([place]) 10/03/2019  . Sepsis ([place]) 09/28/2019  . HFrEF (heart failure with reduced ejection fraction) ([place]) 09/28/2019  . Moderate aortic stenosis 09/28/2019  . Anemia in chronic illness 09/27/2019  . Necrotizing fasciitis ([place]) 09/26/2019  . 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 Severe protein-calorie malnutrition ([place])   . PVD (peripheral vascular disease) ([place])   .  Cellulitis 09/22/2019  . Foot ulcer ([place]) 09/22/2019  . SOB (shortness of breath) 07/26/2018  . Bilateral otitis media 06/22/2018  . Peripheral edema 02/24/2018  . Umbilical hernia 33/83/2919  . Obesity 10/09/2015  . Tobacco abuse 09/07/2013  . Uncontrolled type 2 diabetes mellitus with diabetic neuropathy, with long-term current use of insulin ([place]) 09/07/2013  . Colon cancer screening 09/07/2013  . Hypercholesteremia 10/28/2010  . ERECTILE DYSFUNCTION 05/22/2009  . HYPERTENSION 01/23/2009   Past Medical History:  Diagnosis Date  . Diabetes mellitus without complication ([place])   . HLD (hyperlipidemia)   . Hypertension     Family History  Problem Relation Age of Onset  . Alcoholism Mother   . Alcoholism Father     Past Surgical  History:  Procedure Laterality Date  . AMPUTATION Left 09/28/2019   Procedure: AMPUTATION BELOW KNEE;  Surgeon: Newt Minion, MD;  Location: [place] [facility];  Service: Orthopedics;  Laterality: Left;  . CARDIOVERSION N/A 10/05/2019   Procedure: CARDIOVERSION;  Surgeon: Sanda Klein, MD;  Location: [place];  Service: Cardiovascular;  Laterality: N/A;  . LEFT HEART CATH AND CORONARY ANGIOGRAPHY N/A 10/03/2019   Procedure: LEFT HEART CATH AND CORONARY ANGIOGRAPHY;  Surgeon: Lorretta Harp, MD;  Location: [place] [facility] LAB;  Service: Cardiovascular;  Laterality: N/A;  . TEE WITHOUT CARDIOVERSION N/A 10/05/2019   Procedure: TRANSESOPHAGEAL ECHOCARDIOGRAM (TEE);  Surgeon: Sanda Klein, MD;  Location: [facility]COPY;  Service: Cardiovascular;  Laterality: N/A;   Social History   Occupational History  . Not on file  Tobacco Use  . Smoking status: Current Every Day Smoker    Packs/day: 0.50    Types: Cigarettes  . Smokeless tobacco: Never Used  Substance and Sexual Activity  . Alcohol use: Yes    Alcohol/week: 6.0 standard drinks    Types: 6 Standard drinks or equivalent per week  . Drug use: No  . Sexual activity: Yes    Partners: Female    Comment: monagamous stable relationship

## 2019-10-16 DIAGNOSIS — Z89512 Acquired absence of left leg below knee: Secondary | ICD-10-CM | POA: Diagnosis not present

## 2019-10-16 DIAGNOSIS — M25512 Pain in left shoulder: Secondary | ICD-10-CM | POA: Diagnosis not present

## 2019-10-16 DIAGNOSIS — E1140 Type 2 diabetes mellitus with diabetic neuropathy, unspecified: Secondary | ICD-10-CM | POA: Diagnosis not present

## 2019-10-20 ENCOUNTER — Ambulatory Visit (INDEPENDENT_AMBULATORY_CARE_PROVIDER_SITE_OTHER): Payer: Medicare PPO | Admitting: Physician Assistant

## 2019-10-20 ENCOUNTER — Encounter: Payer: Self-pay | Admitting: Physician Assistant

## 2019-10-20 VITALS — Ht 72.0 in | Wt 227.0 lb

## 2019-10-20 DIAGNOSIS — S81812A Laceration without foreign body, left lower leg, initial encounter: Secondary | ICD-10-CM | POA: Diagnosis not present

## 2019-10-20 DIAGNOSIS — S88119A Complete traumatic amputation at level between knee and ankle, unspecified lower leg, initial encounter: Secondary | ICD-10-CM

## 2019-10-20 DIAGNOSIS — Z89512 Acquired absence of left leg below knee: Secondary | ICD-10-CM | POA: Diagnosis not present

## 2019-10-20 NOTE — Progress Notes (Signed)
Office Visit Note   Patient: Rodney [name]           Date of Birth: 20-Jun-1950           MRN: [id number] Visit Date: 10/20/2019              Requested by: [name], MD [address] [place],  [place] [address] PCP: [name], MD  Chief Complaint  Patient presents with  . 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  He did have a fall this week onto his amputation stump.  He is at a nursing facility and hoping to be released from the nursing facility.  He is also complaining of some pain in his left scapula.  This began when he was doing upper body strengthening.  He does not relate that it is in his shoulder and he has no difficulty with shoulder range of motion.  It hurts when he is doing upper extremity cycling.  And at night when he is sleeping on it  Assessment & Plan: Visit Diagnoses: No diagnosis found.  Plan: Follow-up in 1 week for staple remover.  Have discussed possibility of a muscle relaxer or a modalities with physical therapy.  For his shoulder.  Follow-Up Instructions: No follow-ups on file.   Ortho Exam  Patient is alert, oriented, no adenopathy, well-dressed, normal affect, normal respiratory effort. Below-knee amputation stump there is some bleeding in the middle of the incision but n there o dehiscence.  Mild to moderate soft tissue swelling but no cellulitis.  He does have an abrasion over the anterior tibia there is no exposed bone no cellulitis no foul odor  Imaging: No results found. No images are attached to the encounter.  Labs: Lab Results  Component Value Date   HGBA1C 9.2 (H) 09/26/2019   HGBA1C 8.7 (A) 09/21/2019   HGBA1C 9.0 (A) 06/22/2018   REPTSTATUS 10/01/2019 FINAL 09/26/2019   CULT  09/26/2019    NO GROWTH 5 DAYS Performed at [facility], [place] [address], [place], [place] [address]      Lab  Results  Component Value Date   ALBUMIN 2.2 (L) 09/26/2019   ALBUMIN 3.9 02/21/2018   ALBUMIN 3.8 10/09/2015    No results found for: MG No results found for: VD25OH  No results found for: PREALBUMIN CBC EXTENDED Latest Ref Rng & Units 10/09/2019 10/04/2019 10/03/2019  WBC 4.0 - 10.5 K/uL 9.8 10.3 10.0  RBC 4.22 - 5.81 MIL/uL 3.65(L) 3.47(L) 3.49(L)  HGB 13.0 - 17.0 g/dL 10.7(L) 10.3(L) 10.3(L)  HCT 39 - 52 % 33.4(L) 32.0(L) 32.3(L)  PLT 150 - 400 K/uL 320 513(H) 514(H)  NEUTROABS 1.7 - 7.7 K/uL - - -  LYMPHSABS 0.7 - 4.0 K/uL - - -     Body mass index is 30.79 kg/m.  Orders:  No orders of the defined types were placed in this encounter.  No orders of the defined types were placed in this encounter.    Procedures: No procedures performed  Clinical Data: No additional findings.  ROS:  All other systems negative, except as noted in the HPI. Review of Systems  Objective: Vital Signs: Ht 6' (1.829 m)   Wt 227 lb (103 kg)   BMI 30.79 kg/m   Specialty Comments:  No specialty comments available.  PMFS History: Patient Active  Problem List   Diagnosis Date Noted  . Atypical atrial flutter ([place])   . Atrial fibrillation ([place]) 10/03/2019  . Sepsis ([place]) 09/28/2019  . HFrEF (heart failure with reduced ejection fraction) ([place]) 09/28/2019  . Moderate aortic stenosis 09/28/2019  . Anemia in chronic illness 09/27/2019  . Necrotizing fasciitis ([place]) 09/26/2019  . 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 Severe protein-calorie malnutrition ([place])   . PVD (peripheral vascular disease) ([place])   . Cellulitis 09/22/2019  . Foot ulcer ([place]) 09/22/2019  . SOB (shortness of breath) 07/26/2018  . Bilateral otitis media 06/22/2018  . Peripheral edema 02/24/2018  . Umbilical hernia 46/65/9935  . Obesity 10/09/2015  . Tobacco abuse 09/07/2013  . Uncontrolled type 2 diabetes mellitus with diabetic neuropathy, with long-term current use of insulin ([place]) 09/07/2013    . Colon cancer screening 09/07/2013  . Hypercholesteremia 10/28/2010  . ERECTILE DYSFUNCTION 05/22/2009  . HYPERTENSION 01/23/2009   Past Medical History:  Diagnosis Date  . Diabetes mellitus without complication ([place])   . HLD (hyperlipidemia)   . Hypertension     Family History  Problem Relation Age of Onset  . Alcoholism Mother   . Alcoholism Father     Past Surgical History:  Procedure Laterality Date  . AMPUTATION Left 09/28/2019   Procedure: AMPUTATION BELOW KNEE;  Surgeon: Newt Minion, MD;  Location: [facility];  Service: Orthopedics;  Laterality: Left;  . CARDIOVERSION N/A 10/05/2019   Procedure: CARDIOVERSION;  Surgeon: Sanda Klein, MD;  Location: [facility];  Service: Cardiovascular;  Laterality: N/A;  . LEFT HEART CATH AND CORONARY ANGIOGRAPHY N/A 10/03/2019   Procedure: LEFT HEART CATH AND CORONARY ANGIOGRAPHY;  Surgeon: Lorretta Harp, MD;  Location: [facility];  Service: Cardiovascular;  Laterality: N/A;  . TEE WITHOUT CARDIOVERSION N/A 10/05/2019   Procedure: TRANSESOPHAGEAL ECHOCARDIOGRAM (TEE);  Surgeon: Sanda Klein, MD;  Location: [facility];  Service: Cardiovascular;  Laterality: N/A;   Social History   Occupational History  . Not on file  Tobacco Use  . Smoking status: Current Every Day Smoker    Packs/day: 0.50    Types: Cigarettes  . Smokeless tobacco: Never Used  Substance and Sexual Activity  . Alcohol use: Yes    Alcohol/week: 6.0 standard drinks    Types: 6 Standard drinks or equivalent per week  . Drug use: No  . Sexual activity: Yes    Partners: Female    Comment: monagamous stable relationship

## 2019-10-23 DIAGNOSIS — I4821 Permanent atrial fibrillation: Secondary | ICD-10-CM | POA: Diagnosis not present

## 2019-10-23 DIAGNOSIS — E1142 Type 2 diabetes mellitus with diabetic polyneuropathy: Secondary | ICD-10-CM | POA: Diagnosis not present

## 2019-10-23 DIAGNOSIS — I5022 Chronic systolic (congestive) heart failure: Secondary | ICD-10-CM | POA: Diagnosis not present

## 2019-10-23 DIAGNOSIS — I739 Peripheral vascular disease, unspecified: Secondary | ICD-10-CM | POA: Diagnosis not present

## 2019-10-24 DIAGNOSIS — I10 Essential (primary) hypertension: Secondary | ICD-10-CM | POA: Diagnosis not present

## 2019-10-24 DIAGNOSIS — R262 Difficulty in walking, not elsewhere classified: Secondary | ICD-10-CM | POA: Diagnosis not present

## 2019-10-24 DIAGNOSIS — M6281 Muscle weakness (generalized): Secondary | ICD-10-CM | POA: Diagnosis not present

## 2019-10-24 DIAGNOSIS — M25512 Pain in left shoulder: Secondary | ICD-10-CM | POA: Diagnosis not present

## 2019-10-24 DIAGNOSIS — Z89512 Acquired absence of left leg below knee: Secondary | ICD-10-CM | POA: Diagnosis not present

## 2019-10-24 DIAGNOSIS — I739 Peripheral vascular disease, unspecified: Secondary | ICD-10-CM | POA: Diagnosis not present

## 2019-10-24 DIAGNOSIS — I484 Atypical atrial flutter: Secondary | ICD-10-CM | POA: Diagnosis not present

## 2019-10-24 DIAGNOSIS — Z4781 Encounter for orthopedic aftercare following surgical amputation: Secondary | ICD-10-CM | POA: Diagnosis not present

## 2019-10-24 DIAGNOSIS — I5020 Unspecified systolic (congestive) heart failure: Secondary | ICD-10-CM | POA: Diagnosis not present

## 2019-10-24 DIAGNOSIS — E1140 Type 2 diabetes mellitus with diabetic neuropathy, unspecified: Secondary | ICD-10-CM | POA: Diagnosis not present

## 2019-10-27 ENCOUNTER — Ambulatory Visit (INDEPENDENT_AMBULATORY_CARE_PROVIDER_SITE_OTHER): Payer: Medicare PPO | Admitting: Physician Assistant

## 2019-10-27 ENCOUNTER — Other Ambulatory Visit: Payer: Self-pay

## 2019-10-27 ENCOUNTER — Encounter: Payer: Self-pay | Admitting: Physician Assistant

## 2019-10-27 VITALS — Ht 72.0 in | Wt 227.0 lb

## 2019-10-27 DIAGNOSIS — Z89512 Acquired absence of left leg below knee: Secondary | ICD-10-CM

## 2019-10-27 DIAGNOSIS — S88119A Complete traumatic amputation at level between knee and ankle, unspecified lower leg, initial encounter: Secondary | ICD-10-CM

## 2019-10-27 NOTE — Progress Notes (Signed)
Office Visit Note   Patient: Rodney [name]           Date of Birth: Feb 28, 1951           MRN: [id number] Visit Date: 10/27/2019              Requested by: [name], MD [address] [place],  [place] [address] PCP: [name], MD  Chief Complaint  Patient presents with  . Left Leg - Routine Post Op    09/28/19 left BKA       HPI: This is a pleasant gentleman who is now 1 month status post left below-knee amputation.  He did fall at rehab and had a small area on the anterior tibia that was abraded.  He did not have any dehiscence of his wound.  He has been using a shrinker against the skin.  He only has a spot amount of drainage mild   Assessment & Plan: Visit Diagnoses: No diagnosis found.  Plan: Patient should continue to use his shrinker and elevate will follow up in 2 weeks  Follow-Up Instructions: No follow-ups on file.   Ortho Exam  Patient is alert, oriented, no adenopathy, well-dressed, normal affect, normal respiratory effort. Focused examination of the left below-knee amputation stump overall well-healed surgical incision he has a still a small abrasion not adjacent to the wound on the anterior tibia from his fall.  The stump is soft and mild amount of soft tissue swelling he does have some eschar in the wound but otherwise well-healed.  After verbal consent surgical staples were harvested today.  He will continue to wear the shrinker.  Follow-up in 2 weeks.  Imaging: No results found.   Labs: Lab Results  Component Value Date   HGBA1C 9.2 (H) 09/26/2019   HGBA1C 8.7 (A) 09/21/2019   HGBA1C 9.0 (A) 06/22/2018   REPTSTATUS 10/01/2019 FINAL 09/26/2019   CULT  09/26/2019    NO GROWTH 5 DAYS Performed at [facility], [place] [address] [place], [place] [address]      Lab Results  Component Value Date   ALBUMIN 2.2 (L) 09/26/2019   ALBUMIN 3.9 02/21/2018   ALBUMIN 3.8 10/09/2015    No results found for: MG No results found  for: VD25OH  No results found for: PREALBUMIN CBC EXTENDED Latest Ref Rng & Units 10/09/2019 10/04/2019 10/03/2019  WBC 4.0 - 10.5 K/uL 9.8 10.3 10.0  RBC 4.22 - 5.81 MIL/uL 3.65(L) 3.47(L) 3.49(L)  HGB 13.0 - 17.0 g/dL 10.7(L) 10.3(L) 10.3(L)  HCT 39 - 52 % 33.4(L) 32.0(L) 32.3(L)  PLT 150 - 400 K/uL 320 513(H) 514(H)  NEUTROABS 1.7 - 7.7 K/uL - - -  LYMPHSABS 0.7 - 4.0 K/uL - - -     Body mass index is 30.79 kg/m.  Orders:  No orders of the defined types were placed in this encounter.  No orders of the defined types were placed in this encounter.    Procedures: No procedures performed  Clinical Data: No additional findings.  ROS:  All other systems negative, except as noted in the HPI. Review of Systems  Objective: Vital Signs: Ht 6' (1.829 m)   Wt 227 lb (103 kg)   BMI 30.79 kg/m   Specialty Comments:  No specialty comments available.  PMFS History: Patient Active Problem List   Diagnosis Date Noted  . Atypical atrial flutter ([place])   . Atrial fibrillation ([place]) 10/03/2019  . Sepsis ([place]) 09/28/2019  . HFrEF (heart  failure with reduced ejection fraction) ([place]) 09/28/2019  . Moderate aortic stenosis 09/28/2019  . Anemia in chronic illness 09/27/2019  . Necrotizing fasciitis ([place]) 09/26/2019  . 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 Severe protein-calorie malnutrition ([place])   . PVD (peripheral vascular disease) ([place])   . Cellulitis 09/22/2019  . Foot ulcer ([place]) 09/22/2019  . SOB (shortness of breath) 07/26/2018  . Bilateral otitis media 06/22/2018  . Peripheral edema 02/24/2018  . Umbilical hernia 24/23/5361  . Obesity 10/09/2015  . Tobacco abuse 09/07/2013  . Uncontrolled type 2 diabetes mellitus with diabetic neuropathy, with long-term current use of insulin ([place]) 09/07/2013  . Colon cancer screening 09/07/2013  . Hypercholesteremia 10/28/2010  . ERECTILE DYSFUNCTION 05/22/2009  . HYPERTENSION 01/23/2009   Past Medical History:    Diagnosis Date  . Diabetes mellitus without complication ([place])   . HLD (hyperlipidemia)   . Hypertension     Family History  Problem Relation Age of Onset  . Alcoholism Mother   . Alcoholism Father     Past Surgical History:  Procedure Laterality Date  . AMPUTATION Left 09/28/2019   Procedure: AMPUTATION BELOW KNEE;  Surgeon: Newt Minion, MD;  Location: [facility];  Service: Orthopedics;  Laterality: Left;  . CARDIOVERSION N/A 10/05/2019   Procedure: CARDIOVERSION;  Surgeon: Sanda Klein, MD;  Location: [facility];  Service: Cardiovascular;  Laterality: N/A;  . LEFT HEART CATH AND CORONARY ANGIOGRAPHY N/A 10/03/2019   Procedure: LEFT HEART CATH AND CORONARY ANGIOGRAPHY;  Surgeon: Lorretta Harp, MD;  Location: [facility];  Service: Cardiovascular;  Laterality: N/A;  . TEE WITHOUT CARDIOVERSION N/A 10/05/2019   Procedure: TRANSESOPHAGEAL ECHOCARDIOGRAM (TEE);  Surgeon: Sanda Klein, MD;  Location: [facility] END[facility]COPY;  Service: Cardiovascular;  Laterality: N/A;   Social History   Occupational History  . Not on file  Tobacco Use  . Smoking status: Current Every Day Smoker    Packs/day: 0.50    Types: Cigarettes  . Smokeless tobacco: Never Used  Substance and Sexual Activity  . Alcohol use: Yes    Alcohol/week: 6.0 standard drinks    Types: 6 Standard drinks or equivalent per week  . Drug use: No  . Sexual activity: Yes    Partners: Female    Comment: monagamous stable relationship

## 2019-10-28 DIAGNOSIS — I484 Atypical atrial flutter: Secondary | ICD-10-CM | POA: Diagnosis not present

## 2019-10-28 DIAGNOSIS — I4891 Unspecified atrial fibrillation: Secondary | ICD-10-CM | POA: Diagnosis not present

## 2019-10-28 DIAGNOSIS — D649 Anemia, unspecified: Secondary | ICD-10-CM | POA: Diagnosis not present

## 2019-10-28 DIAGNOSIS — Z4781 Encounter for orthopedic aftercare following surgical amputation: Secondary | ICD-10-CM | POA: Diagnosis not present

## 2019-10-28 DIAGNOSIS — E1151 Type 2 diabetes mellitus with diabetic peripheral angiopathy without gangrene: Secondary | ICD-10-CM | POA: Diagnosis not present

## 2019-10-28 DIAGNOSIS — I350 Nonrheumatic aortic (valve) stenosis: Secondary | ICD-10-CM | POA: Diagnosis not present

## 2019-10-28 DIAGNOSIS — I5021 Acute systolic (congestive) heart failure: Secondary | ICD-10-CM | POA: Diagnosis not present

## 2019-10-28 DIAGNOSIS — E1140 Type 2 diabetes mellitus with diabetic neuropathy, unspecified: Secondary | ICD-10-CM | POA: Diagnosis not present

## 2019-10-28 DIAGNOSIS — I110 Hypertensive heart disease with heart failure: Secondary | ICD-10-CM | POA: Diagnosis not present

## 2019-10-31 ENCOUNTER — Telehealth: Payer: Self-pay

## 2019-10-31 NOTE — Telephone Encounter (Signed)
Rodney [name], [facility] Nurse, LVM on nurse line reporting non compliance with medications. Rodney [name] reports patient feels his diagnoses of CHF, diabetes, etc.. are not true. He does not have these conditions. Patient is no longer checking his blood sugar since his last a1c was 9.2 and this is a "good" result to him. [facility] Nurse just wants to make PCP aware. Rodney [name] does have [facility] orders in place to see patient for the next several weeks if there is anything you would like her to do/monitor.

## 2019-10-31 NOTE — Telephone Encounter (Signed)
He should be seen by me in the next 1-2 weeks.  He is a hard headed patient - and I can generally reason with him.  Please get him an appointment.  Rush [facility]

## 2019-10-31 NOTE — Progress Notes (Signed)
Cardiology Office Note:   Date:  11/03/2019  NAME:  Rodney [name]    MRN: [id number] DOB:  [birth year]   PCP:  [name], MD  Cardiologist:  Pixie Casino, MD   Referring MD: [name], MD   Chief Complaint  Patient presents with   Atrial Fibrillation   History of Present Illness:   Rodney [name] is a 69 y.o. male with a hx of uncontrolled DM, CHF, CAD, HTN who presents for follow-up. Recent admission for lower extremity cellulitis. S/p transtibial amputation of LE. Developed postop Afib and found to have newly reduced EF. Left heart cath with RCA CTO. Echo with moderate aortic stenosis.   Closed he presents today for follow-up.  Back in atrial fibrillation.  No symptoms from this.  Heart rate is around 100.  He reports no heart failure symptoms.  He did complete 17 days in rehab and is out.  He is doing home health PT.  He does not do much activity other than working with physical therapy.  He reports that he has no chest pain or shortness of breath with his current level of activity.  He reports no increased lower extremity edema.  He reports he is unaware he was in atrial fibrillation.  He also reports he is not taking his amiodarone, Coreg, losartan, Eliquis. Apparently there was confusion about leaving the rehab facility about what he should and should not take.  There is also cost issues.  He reports he cannot afford medication refills until the 21st.  We did discuss his atrial fibrillation that he had come back and any attempts at restoration of sinus rhythm would require him to be on uninterrupted anticoagulation.  Right now this is outside of the question.  We will plan to see him back soon to determine what medications he can afford and then make a plan about any attempts at returning to normal sinus rhythm.  Despite not being on medications he reports no heart failure symptoms.  He looks good on exam.  He describes no angina.  Problem List 1. Systolic HF,  40-98% 04/27/9145 -LHC with RCA CTO 2. Atrial fibrillation, persistent (CHADSVASC = 5; CAD, HTN, DM, Age >72, CHF) -TEE/DCCV 10/05/2019 -developed postop after L BKA 3. CAD -RCA CTO 4. Moderate Aortic stenosis -09/2019: V max 3.06 m/s, MG 25 mmHG 5. CKD III 6. DM -A1c 9.2 -T chol 91, HDL 20, LDL 48, TG 113 7. Transtibial amputation  Past Medical History: Past Medical History:  Diagnosis Date   Diabetes mellitus without complication ([place])    HLD (hyperlipidemia)    Hypertension     Past Surgical History: Past Surgical History:  Procedure Laterality Date   AMPUTATION Left 09/28/2019   Procedure: AMPUTATION BELOW KNEE;  Surgeon: Newt Minion, MD;  Location: [facility];  Service: Orthopedics;  Laterality: Left;   CARDIOVERSION N/A 10/05/2019   Procedure: CARDIOVERSION;  Surgeon: Sanda Klein, MD;  Location: [facility];  Service: Cardiovascular;  Laterality: N/A;   LEFT HEART CATH AND CORONARY ANGIOGRAPHY N/A 10/03/2019   Procedure: LEFT HEART CATH AND CORONARY ANGIOGRAPHY;  Surgeon: Lorretta Harp, MD;  Location: [facility];  Service: Cardiovascular;  Laterality: N/A;   TEE WITHOUT CARDIOVERSION N/A 10/05/2019   Procedure: TRANSESOPHAGEAL ECHOCARDIOGRAM (TEE);  Surgeon: Sanda Klein, MD;  Location: [facility];  Service: Cardiovascular;  Laterality: N/A;    Current Medications: Current Meds  Medication Sig   atorvastatin (LIPITOR) 80 MG tablet Take 1 tablet (80  mg total) by mouth daily.   furosemide (LASIX) 20 MG tablet Take 1 tablet (20 mg total) by mouth daily.   glucose blood (ACCU-CHEK AVIVA) test strip Use as instructed (Patient taking differently: 1 each by Other route as directed. )   insulin lispro protamine-lispro (HUMALOG 50/50 MIX) (50-50) 100 UNIT/ML SUSP injection Inject into the skin 2 (two) times daily before a meal.   metFORMIN (GLUCOPHAGE) 1000 MG tablet TAKE 1 TABLET (1,000 MG TOTAL) BY MOUTH 2 (TWO) TIMES DAILY WITH A MEAL.    [DISCONTINUED] aspirin 81 MG tablet Take 81 mg by mouth daily.     [DISCONTINUED] atorvastatin (LIPITOR) 80 MG tablet Take 1 tablet (80 mg total) by mouth daily.   [DISCONTINUED] furosemide (LASIX) 20 MG tablet Take 1 tablet (20 mg total) by mouth daily.     Allergies:    Patient has no known allergies.   Social History: Social History   Socioeconomic History   Marital status: Widowed    Spouse name: Not on file   Number of children: Not on file   Years of education: Not on file   Highest education level: Not on file  Occupational History   Not on file  Tobacco Use   Smoking status: Current Every Day Smoker    Packs/day: 0.50    Types: Cigarettes   Smokeless tobacco: Never Used  Substance and Sexual Activity   Alcohol use: Yes    Alcohol/week: 6.0 standard drinks    Types: 6 Standard drinks or equivalent per week   Drug use: No   Sexual activity: Yes    Partners: Female    Comment: monagamous stable relationship  Other Topics Concern   Not on file  Social History Narrative   Not on file   Social Determinants of Health   Financial Resource Strain:    Difficulty of Paying Living Expenses:   Food Insecurity:    Worried About Charity fundraiser in the Last Year:    Arboriculturist in the Last Year:   Transportation Needs:    Film/video editor (Medical):    Lack of Transportation (Non-Medical):   Physical Activity:    Days of Exercise per Week:    Minutes of Exercise per Session:   Stress:    Feeling of Stress :   Social Connections:    Frequency of Communication with Friends and Family:    Frequency of Social Gatherings with Friends and Family:    Attends Religious Services:    Active Member of Clubs or Organizations:    Attends Archivist Meetings:    Marital Status:      Family History: The patient's family history includes Alcoholism in his father and mother.  ROS:   All other ROS reviewed and negative.  Pertinent positives noted in the HPI.     EKGs/Labs/Other Studies Reviewed:   The following studies were personally reviewed by me today:  EKG:  EKG is ordered today.  The ekg ordered today demonstrates atrial fibrillation, heart rate 101, nonspecific ST-T changes, and was personally reviewed by me.   LHC 10/03/2019  - RCA CTO  TTE 09/28/2019  1. Assessment of his LV function is difficult. Consider repeat echo with  definity contrast once HR is slower . Marland Kitchen Left ventricular ejection  fraction, by estimation, is 35 to 40%. The left ventricle has moderately  decreased function. The left ventricle  demonstrates global hypokinesis. Left ventricular diastolic parameters are  indeterminate.  2. Right ventricular  systolic function is mildly reduced. The right  ventricular size is normal.  3. Left atrial size was mildly dilated.  4. Right atrial size was mildly dilated.  5. The mitral valve is normal in structure. Mild mitral valve  regurgitation. No evidence of mitral stenosis.  6. The aortic valve is not well visualized in the short axis view but is  likely a 3 leaflet valve .Marland Kitchen The aortic valve is tricuspid. Aortic valve  regurgitation is not visualized. Moderate aortic valve stenosis.   Recent Labs: 09/26/2019: ALT 71 09/27/2019: B Natriuretic Peptide 195.1; TSH 1.215 10/09/2019: BUN 28; Creatinine, Ser 1.48; Hemoglobin 10.7; Platelets 320; Potassium 4.1; Sodium 134   Recent Lipid Panel    Component Value Date/Time   CHOL 91 09/26/2019 1736   CHOL 140 02/21/2018 1650   TRIG 113 09/26/2019 1736   HDL 20 (L) 09/26/2019 1736   HDL 33 (L) 02/21/2018 1650   CHOLHDL 4.6 09/26/2019 1736   VLDL 23 09/26/2019 1736   LDLCALC 48 09/26/2019 1736   LDLCALC 79 02/21/2018 1650   LDLDIRECT 92 01/03/2014 1514    Physical Exam:   VS:  BP (!) 99/55    Pulse (!) 106    Ht 6' (1.829 m)    Wt 230 lb (104.3 kg)    SpO2 95%    BMI 31.19 kg/m    Wt Readings from Last 3 Encounters:  11/03/19 230 lb  (104.3 kg)  10/27/19 227 lb (103 kg)  10/20/19 227 lb (103 kg)    General: Well nourished, well developed, in no acute distress Heart: Atraumatic, normal size  Eyes: PEERLA, EOMI  Neck: Supple, no JVD Endocrine: No thryomegaly Cardiac: Normal S1, S2; 3-6 systolic ejection murmur Lungs: Clear to auscultation bilaterally, no wheezing, rhonchi or rales  Abd: Soft, nontender, no hepatomegaly  Ext: No edema, pulses 2+ Musculoskeletal: No deformities, BUE and BLE strength normal and equal Skin: Warm and dry, no rashes   Neuro: Alert and oriented to person, place, time, and situation, CNII-XII grossly intact, no focal deficits  Psych: Normal mood and affect   ASSESSMENT:   KALIK HOARE is a 69 y.o. male who presents for the following: 1. Chronic systolic heart failure (HCC)   2. Persistent atrial fibrillation ([place])   3. Coronary artery disease involving native coronary artery of native heart without angina pectoris   4. Moderate aortic stenosis   5. Hypercholesteremia   6. HYPERTENSION     PLAN:   1. Chronic systolic heart failure ([place]), ejection fraction 35 to 40% -Diagnosed 09/28/2019.  This was in the setting of atrial fibrillation with RVR as well as sepsis secondary to left lower extremity infection that required amputation.  He did undergo left heart catheterization that showed a CTO of the RCA and no significant disease in the left system. -I suspect this is a mixed ischemic and nonischemic etiology.  A. fib did not help.  Uncontrolled diabetes mellitus. -He has not been on heart failure medications.  We will refill his Coreg 12.5 mg twice daily, losartan 25 mg daily.  His blood pressure is a bit soft.  We will hold on Aldactone.  We will continue Lasix 20 mg daily. -Unclear if his atrial fibrillation led to his heart failure.  He has been unable to get back on Eliquis at this time.  Repeat attempt at cardioversion either question.  He will hold on amiodarone as well.  We will  discuss further attempted sinus restoration at a later visit.  See detailed discussion below.  2. Persistent atrial fibrillation ([place]) -Status post TEE/cardioversion on 10/05/2019.  He is no longer on Eliquis. CHADSVASC = 5; CAD, HTN, DM, Age >65, CHF.  He reports he would not be able to get on Eliquis until a few weeks.  I have advised that he be on this.  We will represcribe Eliquis and he will try to get back on this.  Samples provided today.  Also provided him with financial assistance forms.  We cannot have another attempt at cardioversion until he is on a blood thinner.  I will see him back in 6 weeks to discuss this further.  He has no symptoms of atrial fibrillation this is reassuring.  I do prefer him to get back in normal sinus rhythm given his reduced ejection fraction but we will see how he does.  3. Coronary artery disease involving native coronary artery of native heart without angina pectoris -CTO of RCA.  No significant disease in the left system.  Continue aspirin and Lipitor.  Most recent LDL cholesterol 48.  No symptoms of angina.  4. Moderate aortic stenosis -Repeat echocardiogram in 2 to 3 years.  5. Hypercholesteremia -Continue Lipitor.  LDL at goal.  6. HYPERTENSION -Restart heart failure medications as above.  Disposition: Return in about 6 weeks (around 12/15/2019).  Medication Adjustments/Labs and Tests Ordered: Current medicines are reviewed at length with the patient today.  Concerns regarding medicines are outlined above.  No orders of the defined types were placed in this encounter.  Meds ordered this encounter  Medications   carvedilol (COREG) 12.5 MG tablet    Sig: Take 1 tablet (12.5 mg total) by mouth 2 (two) times daily with a meal.    Dispense:  180 tablet    Refill:  1   atorvastatin (LIPITOR) 80 MG tablet    Sig: Take 1 tablet (80 mg total) by mouth daily.    Dispense:  90 tablet    Refill:  1   furosemide (LASIX) 20 MG tablet    Sig: Take 1  tablet (20 mg total) by mouth daily.    Dispense:  90 tablet    Refill:  1   losartan (COZAAR) 25 MG tablet    Sig: Take 1 tablet (25 mg total) by mouth daily.    Dispense:  90 tablet    Refill:  1   aspirin EC 81 MG tablet    Sig: Take 1 tablet (81 mg total) by mouth daily. Swallow whole.    Dispense:  90 tablet    Refill:  3   apixaban (ELIQUIS) 5 MG TABS tablet    Sig: Take 1 tablet (5 mg total) by mouth 2 (two) times daily.    Dispense:  60 tablet    Refill:  2    Patient Instructions  Medication Instructions:  Start Coreg 12.5 mg twice daily  Start Losartan 25 mg daily (when you start this, stop your Lisinopril medication) Start Lasix 20 mg daily  Continue Eliquis 5 mg daily  Continue Lipitor 80 mg daily   *If you need a refill on your cardiac medications before your next appointment, please call your pharmacy*   Follow-Up: At [facility], you and your health needs are our priority.  As part of our continuing mission to provide you with exceptional heart care, we have created designated Provider Care Teams.  These Care Teams include your primary Cardiologist (physician) and Advanced Practice Providers (APPs -  Physician Assistants and Nurse Practitioners)  who all work together to provide you with the care you need, when you need it.  We recommend signing up for the patient portal called "MyChart".  Sign up information is provided on this After Visit Summary.  MyChart is used to connect with patients for Virtual Visits (Telemedicine).  Patients are able to view lab/test results, encounter notes, upcoming appointments, etc.  Non-urgent messages can be sent to your provider as well.   To learn more about what you can do with MyChart, go to [email or web address].    Your next appointment:   6 week(s)  The format for your next appointment:   In Person  Provider:   Eleonore Chiquito, MD         Time Spent with Patient: I have spent a total of 35 minutes with  patient reviewing hospital notes, telemetry, EKGs, labs and examining the patient as well as establishing an assessment and plan that was discussed with the patient.  > 50% of time was spent in direct patient care.  Signed, Addison Naegeli. Audie Box, [place]  [address] [place], [place] [address] [phone]  11/03/2019 12:03 PM

## 2019-11-01 DIAGNOSIS — I350 Nonrheumatic aortic (valve) stenosis: Secondary | ICD-10-CM | POA: Diagnosis not present

## 2019-11-01 DIAGNOSIS — E1151 Type 2 diabetes mellitus with diabetic peripheral angiopathy without gangrene: Secondary | ICD-10-CM | POA: Diagnosis not present

## 2019-11-01 DIAGNOSIS — E1140 Type 2 diabetes mellitus with diabetic neuropathy, unspecified: Secondary | ICD-10-CM | POA: Diagnosis not present

## 2019-11-01 DIAGNOSIS — I484 Atypical atrial flutter: Secondary | ICD-10-CM | POA: Diagnosis not present

## 2019-11-01 DIAGNOSIS — I4891 Unspecified atrial fibrillation: Secondary | ICD-10-CM | POA: Diagnosis not present

## 2019-11-01 DIAGNOSIS — Z4781 Encounter for orthopedic aftercare following surgical amputation: Secondary | ICD-10-CM | POA: Diagnosis not present

## 2019-11-01 DIAGNOSIS — I5021 Acute systolic (congestive) heart failure: Secondary | ICD-10-CM | POA: Diagnosis not present

## 2019-11-01 DIAGNOSIS — D649 Anemia, unspecified: Secondary | ICD-10-CM | POA: Diagnosis not present

## 2019-11-01 DIAGNOSIS — I110 Hypertensive heart disease with heart failure: Secondary | ICD-10-CM | POA: Diagnosis not present

## 2019-11-02 ENCOUNTER — Telehealth: Payer: Self-pay | Admitting: Orthopedic Surgery

## 2019-11-02 NOTE — Telephone Encounter (Signed)
Returned call see previous note.

## 2019-11-02 NOTE — Telephone Encounter (Signed)
I called and sw Darlene and advised verbal ok for physical therapy eval and treat, Education officer, museum for community based programs and financial assistance HHN eval incisional care and compliance with shrinker. Will call with any other questions and or needs.

## 2019-11-02 NOTE — Telephone Encounter (Signed)
Darlene(Clinical Manager) from [email or web address]  called requesting verbal orders for pt. Physical Therapy twice a week for 3 weeks and remaining time every week twice a week for remaining 5 weeks. Darlene stated patient can not afford any services at this time and they are working on helping get patient funds for PT, medications, and supplies. Carlyon Shadow states patient start of cared was 10/28/19. Home Health noticed not all medications are not in home due to pt unable to afford, No wound care because pt is unable to afford, blood pressure machine not working, Carlyon Shadow is working on getting new one for pt. Pt refused home health aide due to unable to afford services. Pt is in need of help. Please contact [place] at [phone]. Carlyon Shadow has a Medical illustrator if she is unable to answer. Please leave detailed message.

## 2019-11-02 NOTE — Telephone Encounter (Signed)
Added note of Darlene Publishing copy from [email or web address] . Pt also denied Congestive heart failure and A-Fib medical conditions. [place] phone number is [phone].

## 2019-11-03 ENCOUNTER — Ambulatory Visit (INDEPENDENT_AMBULATORY_CARE_PROVIDER_SITE_OTHER): Payer: Medicare PPO | Admitting: Cardiovascular Disease

## 2019-11-03 ENCOUNTER — Encounter: Payer: Self-pay | Admitting: Cardiovascular Disease

## 2019-11-03 ENCOUNTER — Other Ambulatory Visit: Payer: Self-pay

## 2019-11-03 VITALS — BP 99/55 | HR 106 | Ht 72.0 in | Wt 230.0 lb

## 2019-11-03 DIAGNOSIS — I10 Essential (primary) hypertension: Secondary | ICD-10-CM

## 2019-11-03 DIAGNOSIS — I5021 Acute systolic (congestive) heart failure: Secondary | ICD-10-CM | POA: Diagnosis not present

## 2019-11-03 DIAGNOSIS — I2510 Atherosclerotic heart disease of native coronary artery without angina pectoris: Secondary | ICD-10-CM

## 2019-11-03 DIAGNOSIS — E1140 Type 2 diabetes mellitus with diabetic neuropathy, unspecified: Secondary | ICD-10-CM | POA: Diagnosis not present

## 2019-11-03 DIAGNOSIS — I5022 Chronic systolic (congestive) heart failure: Secondary | ICD-10-CM

## 2019-11-03 DIAGNOSIS — I110 Hypertensive heart disease with heart failure: Secondary | ICD-10-CM | POA: Diagnosis not present

## 2019-11-03 DIAGNOSIS — I4891 Unspecified atrial fibrillation: Secondary | ICD-10-CM | POA: Diagnosis not present

## 2019-11-03 DIAGNOSIS — I350 Nonrheumatic aortic (valve) stenosis: Secondary | ICD-10-CM

## 2019-11-03 DIAGNOSIS — E1151 Type 2 diabetes mellitus with diabetic peripheral angiopathy without gangrene: Secondary | ICD-10-CM | POA: Diagnosis not present

## 2019-11-03 DIAGNOSIS — E7800 Pure hypercholesterolemia, unspecified: Secondary | ICD-10-CM

## 2019-11-03 DIAGNOSIS — I484 Atypical atrial flutter: Secondary | ICD-10-CM | POA: Diagnosis not present

## 2019-11-03 DIAGNOSIS — Z4781 Encounter for orthopedic aftercare following surgical amputation: Secondary | ICD-10-CM | POA: Diagnosis not present

## 2019-11-03 DIAGNOSIS — I4819 Other persistent atrial fibrillation: Secondary | ICD-10-CM | POA: Diagnosis not present

## 2019-11-03 DIAGNOSIS — D649 Anemia, unspecified: Secondary | ICD-10-CM | POA: Diagnosis not present

## 2019-11-03 MED ORDER — ATORVASTATIN CALCIUM 80 MG PO TABS: 80.0000 mg | ORAL_TABLET | Freq: Every day | ORAL | 1 refills | Status: DC

## 2019-11-03 MED ORDER — ASPIRIN EC 81 MG PO TBEC
81.0000 mg | DELAYED_RELEASE_TABLET | Freq: Every day | ORAL | 3 refills | Status: DC
Start: 2019-11-03 — End: 2020-08-13

## 2019-11-03 MED ORDER — APIXABAN 5 MG PO TABS: 5.0000 mg | ORAL_TABLET | Freq: Two times a day (BID) | ORAL | 2 refills | Status: DC

## 2019-11-03 MED ORDER — LOSARTAN POTASSIUM 25 MG PO TABS: 25.0000 mg | ORAL_TABLET | Freq: Every day | ORAL | 1 refills | Status: DC

## 2019-11-03 MED ORDER — FUROSEMIDE 20 MG PO TABS: 20.0000 mg | ORAL_TABLET | Freq: Every day | ORAL | 1 refills | Status: DC

## 2019-11-03 MED ORDER — CARVEDILOL 12.5 MG PO TABS: 12.5000 mg | ORAL_TABLET | Freq: Two times a day (BID) | ORAL | 1 refills | Status: DC

## 2019-11-03 NOTE — Telephone Encounter (Signed)
Scheduled for 7/19

## 2019-11-03 NOTE — Addendum Note (Signed)
Addended by: Wonda Horner on: 11/03/2019 04:24 PM   Modules accepted: Orders

## 2019-11-03 NOTE — Patient Instructions (Signed)
Medication Instructions:  Start Coreg 12.5 mg twice daily  Start Losartan 25 mg daily (when you start this, stop your Lisinopril medication) Start Lasix 20 mg daily  Continue Eliquis 5 mg daily  Continue Lipitor 80 mg daily   *If you need a refill on your cardiac medications before your next appointment, please call your pharmacy*   Follow-Up: At [facility], you and your health needs are our priority.  As part of our continuing mission to provide you with exceptional heart care, we have created designated Provider Care Teams.  These Care Teams include your primary Cardiologist (physician) and Advanced Practice Providers (APPs -  Physician Assistants and Nurse Practitioners) who all work together to provide you with the care you need, when you need it.  We recommend signing up for the patient portal called "MyChart".  Sign up information is provided on this After Visit Summary.  MyChart is used to connect with patients for Virtual Visits (Telemedicine).  Patients are able to view lab/test results, encounter notes, upcoming appointments, etc.  Non-urgent messages can be sent to your provider as well.   To learn more about what you can do with MyChart, go to [email or web address].    Your next appointment:   6 week(s)  The format for your next appointment:   In Person  Provider:   Eleonore Chiquito, MD

## 2019-11-04 DIAGNOSIS — I5021 Acute systolic (congestive) heart failure: Secondary | ICD-10-CM | POA: Diagnosis not present

## 2019-11-04 DIAGNOSIS — I350 Nonrheumatic aortic (valve) stenosis: Secondary | ICD-10-CM | POA: Diagnosis not present

## 2019-11-04 DIAGNOSIS — E1151 Type 2 diabetes mellitus with diabetic peripheral angiopathy without gangrene: Secondary | ICD-10-CM | POA: Diagnosis not present

## 2019-11-04 DIAGNOSIS — D649 Anemia, unspecified: Secondary | ICD-10-CM | POA: Diagnosis not present

## 2019-11-04 DIAGNOSIS — I484 Atypical atrial flutter: Secondary | ICD-10-CM | POA: Diagnosis not present

## 2019-11-04 DIAGNOSIS — E1140 Type 2 diabetes mellitus with diabetic neuropathy, unspecified: Secondary | ICD-10-CM | POA: Diagnosis not present

## 2019-11-04 DIAGNOSIS — I4891 Unspecified atrial fibrillation: Secondary | ICD-10-CM | POA: Diagnosis not present

## 2019-11-04 DIAGNOSIS — Z4781 Encounter for orthopedic aftercare following surgical amputation: Secondary | ICD-10-CM | POA: Diagnosis not present

## 2019-11-04 DIAGNOSIS — I110 Hypertensive heart disease with heart failure: Secondary | ICD-10-CM | POA: Diagnosis not present

## 2019-11-07 ENCOUNTER — Telehealth: Payer: Self-pay | Admitting: Orthopedic Surgery

## 2019-11-07 DIAGNOSIS — I110 Hypertensive heart disease with heart failure: Secondary | ICD-10-CM | POA: Diagnosis not present

## 2019-11-07 DIAGNOSIS — I484 Atypical atrial flutter: Secondary | ICD-10-CM | POA: Diagnosis not present

## 2019-11-07 DIAGNOSIS — E1151 Type 2 diabetes mellitus with diabetic peripheral angiopathy without gangrene: Secondary | ICD-10-CM | POA: Diagnosis not present

## 2019-11-07 DIAGNOSIS — I350 Nonrheumatic aortic (valve) stenosis: Secondary | ICD-10-CM | POA: Diagnosis not present

## 2019-11-07 DIAGNOSIS — Z4781 Encounter for orthopedic aftercare following surgical amputation: Secondary | ICD-10-CM | POA: Diagnosis not present

## 2019-11-07 DIAGNOSIS — E1140 Type 2 diabetes mellitus with diabetic neuropathy, unspecified: Secondary | ICD-10-CM | POA: Diagnosis not present

## 2019-11-07 DIAGNOSIS — D649 Anemia, unspecified: Secondary | ICD-10-CM | POA: Diagnosis not present

## 2019-11-07 DIAGNOSIS — I5021 Acute systolic (congestive) heart failure: Secondary | ICD-10-CM | POA: Diagnosis not present

## 2019-11-07 DIAGNOSIS — I4891 Unspecified atrial fibrillation: Secondary | ICD-10-CM | POA: Diagnosis not present

## 2019-11-07 NOTE — Telephone Encounter (Signed)
Marcene Brawn from [facility] called stating patient is not open or receptive to need notion. Kara decreased services for pt for once a week next week and service ended. Marcene Brawn stated patient stated he needs no knowledge from [place]. [place] phone number is [phone]. Marcene Brawn states she does not need a call back unless Dr. Sharol Given or staff has questions.

## 2019-11-07 NOTE — Telephone Encounter (Signed)
Noted, thank you

## 2019-11-09 ENCOUNTER — Ambulatory Visit (INDEPENDENT_AMBULATORY_CARE_PROVIDER_SITE_OTHER): Payer: Medicare PPO | Admitting: Physician Assistant

## 2019-11-09 ENCOUNTER — Other Ambulatory Visit: Payer: Self-pay

## 2019-11-09 ENCOUNTER — Encounter: Payer: Self-pay | Admitting: Physician Assistant

## 2019-11-09 VITALS — Ht 72.0 in | Wt 230.0 lb

## 2019-11-09 DIAGNOSIS — Z89512 Acquired absence of left leg below knee: Secondary | ICD-10-CM

## 2019-11-09 DIAGNOSIS — S88119A Complete traumatic amputation at level between knee and ankle, unspecified lower leg, initial encounter: Secondary | ICD-10-CM

## 2019-11-09 NOTE — Progress Notes (Signed)
Office Visit Note   Patient: Rodney [name]           Date of Birth: 07/23/1950           MRN: [id number] Visit Date: 11/09/2019              Requested by: [name], MD [address] [place],  [place] [address] PCP: [name], MD  Chief Complaint  Patient presents with  . Left Leg - Routine Post Op    09/28/19 LBKA      HPI: This is a pleasant 69 year old gentleman who is 6 weeks status post left below-knee amputation.  He was in a nursing facility but is now home.  He is doing quite well and has no complaints  Assessment & Plan: Visit Diagnoses: No diagnosis found.  Plan: Patient will follow up in 4 weeks.  He has been given a prescription to obtain a K2 socket and supplies.  Also gave him a prescription for 3 XL shrinker  Follow-Up Instructions: No follow-ups on file.   Ortho Exam  Patient is alert, oriented, no adenopathy, well-dressed, normal affect, normal respiratory effort. Patient has a well-healed surgical incision with just minimal eschar still resolving.  No cellulitis minimal soft tissue swelling full knee range of motion he did have a small retained braided Vicryl suture that I removed with ease today no sign of any infection Patient is a new left transtibial  amputee.  Patient's current comorbidities are not expected to impact the ability to function with the prescribed prosthesis. Patient verbally communicates a strong desire to use a prosthesis. Patient currently requires mobility aids to ambulate without a prosthesis.  Expects not to use mobility aids with a new prosthesis.  Patient is a K2 level ambulator that will use a prosthesis to walk around their home and the community over low level environmental barriers.     Imaging: No results found. No images are attached to the encounter.  Labs: Lab Results  Component Value Date   HGBA1C 9.2 (H) 09/26/2019   HGBA1C 8.7 (A) 09/21/2019   HGBA1C 9.0 (A) 06/22/2018   REPTSTATUS  10/01/2019 FINAL 09/26/2019   CULT  09/26/2019    NO GROWTH 5 DAYS Performed at [facility], [place] [address] [place], [place] [address]      Lab Results  Component Value Date   ALBUMIN 2.2 (L) 09/26/2019   ALBUMIN 3.9 02/21/2018   ALBUMIN 3.8 10/09/2015    No results found for: MG No results found for: VD25OH  No results found for: PREALBUMIN CBC EXTENDED Latest Ref Rng & Units 10/09/2019 10/04/2019 10/03/2019  WBC 4.0 - 10.5 K/uL 9.8 10.3 10.0  RBC 4.22 - 5.81 MIL/uL 3.65(L) 3.47(L) 3.49(L)  HGB 13.0 - 17.0 g/dL 10.7(L) 10.3(L) 10.3(L)  HCT 39 - 52 % 33.4(L) 32.0(L) 32.3(L)  PLT 150 - 400 K/uL 320 513(H) 514(H)  NEUTROABS 1.7 - 7.7 K/uL - - -  LYMPHSABS 0.7 - 4.0 K/uL - - -     Body mass index is 31.19 kg/m.  Orders:  No orders of the defined types were placed in this encounter.  No orders of the defined types were placed in this encounter.    Procedures: No procedures performed  Clinical Data: No additional findings.  ROS:  All other systems negative, except as noted in the HPI. Review of Systems  Objective: Vital Signs: Ht 6' (1.829 m)   Wt 230 lb (104.3 kg)   BMI 31.19  kg/m   Specialty Comments:  No specialty comments available.  PMFS History: Patient Active Problem List   Diagnosis Date Noted  . Atypical atrial flutter ([place])   . Atrial fibrillation ([place]) 10/03/2019  . Sepsis ([place]) 09/28/2019  . HFrEF (heart failure with reduced ejection fraction) ([place]) 09/28/2019  . Moderate aortic stenosis 09/28/2019  . Anemia in chronic illness 09/27/2019  . Necrotizing fasciitis ([place]) 09/26/2019  . 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 Severe protein-calorie malnutrition ([place])   . PVD (peripheral vascular disease) ([place])   . Cellulitis 09/22/2019  . Foot ulcer ([place]) 09/22/2019  . SOB (shortness of breath) 07/26/2018  . Bilateral otitis media 06/22/2018  . Peripheral edema 02/24/2018  . Umbilical hernia 28/20/6015  . Obesity  10/09/2015  . Tobacco abuse 09/07/2013  . Uncontrolled type 2 diabetes mellitus with diabetic neuropathy, with long-term current use of insulin ([place]) 09/07/2013  . Colon cancer screening 09/07/2013  . Hypercholesteremia 10/28/2010  . ERECTILE DYSFUNCTION 05/22/2009  . HYPERTENSION 01/23/2009   Past Medical History:  Diagnosis Date  . Diabetes mellitus without complication ([place])   . HLD (hyperlipidemia)   . Hypertension     Family History  Problem Relation Age of Onset  . Alcoholism Mother   . Alcoholism Father     Past Surgical History:  Procedure Laterality Date  . AMPUTATION Left 09/28/2019   Procedure: AMPUTATION BELOW KNEE;  Surgeon: Newt Minion, MD;  Location: [facility];  Service: Orthopedics;  Laterality: Left;  . CARDIOVERSION N/A 10/05/2019   Procedure: CARDIOVERSION;  Surgeon: Sanda Klein, MD;  Location: [facility];  Service: Cardiovascular;  Laterality: N/A;  . LEFT HEART CATH AND CORONARY ANGIOGRAPHY N/A 10/03/2019   Procedure: LEFT HEART CATH AND CORONARY ANGIOGRAPHY;  Surgeon: Lorretta Harp, MD;  Location: [facility];  Service: Cardiovascular;  Laterality: N/A;  . TEE WITHOUT CARDIOVERSION N/A 10/05/2019   Procedure: TRANSESOPHAGEAL ECHOCARDIOGRAM (TEE);  Surgeon: Sanda Klein, MD;  Location: [facility];  Service: Cardiovascular;  Laterality: N/A;   Social History   Occupational History  . Not on file  Tobacco Use  . Smoking status: Current Every Day Smoker    Packs/day: 0.50    Types: Cigarettes  . Smokeless tobacco: Never Used  Substance and Sexual Activity  . Alcohol use: Yes    Alcohol/week: 6.0 standard drinks    Types: 6 Standard drinks or equivalent per week  . Drug use: No  . Sexual activity: Yes    Partners: Female    Comment: monagamous stable relationship

## 2019-11-10 DIAGNOSIS — I5021 Acute systolic (congestive) heart failure: Secondary | ICD-10-CM | POA: Diagnosis not present

## 2019-11-10 DIAGNOSIS — E1140 Type 2 diabetes mellitus with diabetic neuropathy, unspecified: Secondary | ICD-10-CM | POA: Diagnosis not present

## 2019-11-10 DIAGNOSIS — I110 Hypertensive heart disease with heart failure: Secondary | ICD-10-CM | POA: Diagnosis not present

## 2019-11-10 DIAGNOSIS — Z4781 Encounter for orthopedic aftercare following surgical amputation: Secondary | ICD-10-CM | POA: Diagnosis not present

## 2019-11-10 DIAGNOSIS — D649 Anemia, unspecified: Secondary | ICD-10-CM | POA: Diagnosis not present

## 2019-11-10 DIAGNOSIS — I4891 Unspecified atrial fibrillation: Secondary | ICD-10-CM | POA: Diagnosis not present

## 2019-11-10 DIAGNOSIS — I484 Atypical atrial flutter: Secondary | ICD-10-CM | POA: Diagnosis not present

## 2019-11-10 DIAGNOSIS — I350 Nonrheumatic aortic (valve) stenosis: Secondary | ICD-10-CM | POA: Diagnosis not present

## 2019-11-10 DIAGNOSIS — E1151 Type 2 diabetes mellitus with diabetic peripheral angiopathy without gangrene: Secondary | ICD-10-CM | POA: Diagnosis not present

## 2019-11-10 NOTE — Addendum Note (Signed)
Addended by: Geralynn Rile on: 11/10/2019 06:10 AM   Modules accepted: Orders

## 2019-11-13 ENCOUNTER — Other Ambulatory Visit: Payer: Self-pay

## 2019-11-13 ENCOUNTER — Encounter: Payer: Self-pay | Admitting: Family Medicine

## 2019-11-13 ENCOUNTER — Ambulatory Visit (INDEPENDENT_AMBULATORY_CARE_PROVIDER_SITE_OTHER): Payer: Medicare PPO | Admitting: Family Medicine

## 2019-11-13 DIAGNOSIS — E1165 Type 2 diabetes mellitus with hyperglycemia: Secondary | ICD-10-CM

## 2019-11-13 DIAGNOSIS — Z89512 Acquired absence of left leg below knee: Secondary | ICD-10-CM | POA: Diagnosis not present

## 2019-11-13 DIAGNOSIS — I5020 Unspecified systolic (congestive) heart failure: Secondary | ICD-10-CM

## 2019-11-13 DIAGNOSIS — I10 Essential (primary) hypertension: Secondary | ICD-10-CM | POA: Diagnosis not present

## 2019-11-13 DIAGNOSIS — IMO0002 Reserved for concepts with insufficient information to code with codable children: Secondary | ICD-10-CM

## 2019-11-13 DIAGNOSIS — I4819 Other persistent atrial fibrillation: Secondary | ICD-10-CM

## 2019-11-13 DIAGNOSIS — Z609 Problem related to social environment, unspecified: Secondary | ICD-10-CM | POA: Diagnosis not present

## 2019-11-13 DIAGNOSIS — E1140 Type 2 diabetes mellitus with diabetic neuropathy, unspecified: Secondary | ICD-10-CM

## 2019-11-13 DIAGNOSIS — Z794 Long term (current) use of insulin: Secondary | ICD-10-CM

## 2019-11-13 MED ORDER — AMIODARONE HCL 200 MG PO TABS: 200.0000 mg | ORAL_TABLET | Freq: Every day | ORAL | 3 refills | Status: DC

## 2019-11-13 MED ORDER — CARVEDILOL 12.5 MG PO TABS: 12.5000 mg | ORAL_TABLET | Freq: Two times a day (BID) | ORAL | 3 refills | Status: DC

## 2019-11-13 MED ORDER — FUROSEMIDE 20 MG PO TABS: 20.0000 mg | ORAL_TABLET | Freq: Every day | ORAL | 3 refills | Status: DC

## 2019-11-13 MED ORDER — SPIRONOLACTONE 25 MG PO TABS: 25.0000 mg | ORAL_TABLET | Freq: Every day | ORAL | 3 refills | Status: DC

## 2019-11-13 MED ORDER — LOSARTAN POTASSIUM 25 MG PO TABS: 25.0000 mg | ORAL_TABLET | Freq: Every day | ORAL | 3 refills | Status: DC

## 2019-11-13 NOTE — Patient Instructions (Addendum)
The medicines your are prescribed are very good.    Vital medicines should be inexpensive ($4 per month at [facility])  Please make sure you get these while we sort out how to get you help. 1. Atorvastatin, for cholesterol 2. Furosemide - a fluid pill 3. Spironolactone - fluid and heart failure 4. Metformin for diabetes. 5. Daily aspirin 81 mg very inexpensive. Acts as a blood thinner. 6. Carvedilol for heart failure and blood pressure. 7. Losartin for blood pressure heart failure.  Takes the place of lisinopril.  Medicines that are expensive and you will need help. 1. Lantus insulin - Ok to use 70/30 for now.  Cut back to 30 units in the morning and 15 units in the evening. 2. Jardiance very expensive for diabetes.  You will need help.  You may need more insulin until we can get you back on the Jardiance. 3. Apixaban, a blood thinner, and amiodarone are both for A fib.  They are expensive and you will need help.

## 2019-11-14 ENCOUNTER — Telehealth: Payer: Self-pay | Admitting: Orthopedic Surgery

## 2019-11-14 ENCOUNTER — Encounter: Payer: Self-pay | Admitting: Family Medicine

## 2019-11-14 DIAGNOSIS — Z89512 Acquired absence of left leg below knee: Secondary | ICD-10-CM | POA: Insufficient documentation

## 2019-11-14 DIAGNOSIS — Z609 Problem related to social environment, unspecified: Secondary | ICD-10-CM | POA: Insufficient documentation

## 2019-11-14 NOTE — Progress Notes (Signed)
    SUBJECTIVE:   CHIEF COMPLAINT / HPI:   Hospital/SNF follow up.  Rodney [name] was in hospital for left foot cellulitis in a diabetic.  He ended up having a left BKA.  Also in the hospital he was diagnosed with  PVD, new HFrEF and Atrial fib.  Post acute hospitalization, he went to SNF for rehab.  [name].    He feels great.  Stump is healing well.  He will be fitted with a foot soon.  He will then work with PT to ambulate.  Left foot nec fascitis resolved with amputation.  Stump healing well  HFrEF - Denies DOE, orthopnea or swelling on current meds.  On appropriate meds.    A fib, on amiodorone and Eliquis.  Followed by cards.  No lightheaded spells.  [name].  He has used insulin 70/30 due to cost.  DCed on lantus which he cannot afford.  Cards gave 6 week samples of eliquis.   Cannot afford jardiance.      OBJECTIVE:   BP (!) 96/56   Pulse 77   Ht 6' (1.829 m)   Wt 227 lb 9.6 oz (103.2 kg)   SpO2 96%   BMI 30.87 kg/m   VS noted including lowish BP and impressive wt loss Lungs clear Cardiac irregular rate.  Rate controled.  No m. Ext no edema.  Left stump healing well. ASSESSMENT/PLAN:   Uncontrolled type 2 diabetes mellitus with diabetic neuropathy, with long-term current use of insulin (HCC) Seems to be doing well but cannot afford jardiance or lantus.  HYPERTENSION Very well controled on HFrEF drugs.  Watch for overtreatment.  HFrEF (heart failure with reduced ejection fraction) ([place]) Nicely controled on current meds.  Atrial fibrillation (HCC) Rate but not rhythm controled.  High risk social situation Cannot afford all meds.  Current med list contains all important, life prolonging meds.  He can afford the generic ones and he has some insurance.  I spent considerable time going over meds and ordering the generic ones through his insurance.  See AVS     Rodney Resides, MD [place]

## 2019-11-14 NOTE — Assessment & Plan Note (Signed)
Very well controled on HFrEF drugs.  Watch for overtreatment.

## 2019-11-14 NOTE — Assessment & Plan Note (Signed)
Cannot afford all meds.  Current med list contains all important, life prolonging meds.  He can afford the generic ones and he has some insurance.  I spent considerable time going over meds and ordering the generic ones through his insurance.  See AVS

## 2019-11-14 NOTE — Telephone Encounter (Signed)
Called and gave verbal ok for PT for BKA  as requested below. To call with additional questions.

## 2019-11-14 NOTE — Telephone Encounter (Signed)
Mark ( physical therapist) called from [email or web address]  called for verbal orders. Mark ( PT) is asking for patient to have physical therapy 1 week 4 and 2 week 4 starting 11/12/19. Elta Guadeloupe is asking for a call back at [phone]. Elta Guadeloupe states if he is unable to answer please leave verbal order approval on VM. Mark phone is a Medical sales representative.

## 2019-11-14 NOTE — Assessment & Plan Note (Signed)
Nicely controled on current meds 

## 2019-11-14 NOTE — Assessment & Plan Note (Signed)
Rate but not rhythm controled.

## 2019-11-14 NOTE — Assessment & Plan Note (Signed)
Seems to be doing well but cannot afford jardiance or lantus.

## 2019-11-17 ENCOUNTER — Telehealth: Payer: Self-pay | Admitting: *Deleted

## 2019-11-17 DIAGNOSIS — IMO0002 Reserved for concepts with insufficient information to code with codable children: Secondary | ICD-10-CM

## 2019-11-17 DIAGNOSIS — I110 Hypertensive heart disease with heart failure: Secondary | ICD-10-CM | POA: Diagnosis not present

## 2019-11-17 DIAGNOSIS — D649 Anemia, unspecified: Secondary | ICD-10-CM | POA: Diagnosis not present

## 2019-11-17 DIAGNOSIS — I350 Nonrheumatic aortic (valve) stenosis: Secondary | ICD-10-CM | POA: Diagnosis not present

## 2019-11-17 DIAGNOSIS — E1140 Type 2 diabetes mellitus with diabetic neuropathy, unspecified: Secondary | ICD-10-CM | POA: Diagnosis not present

## 2019-11-17 DIAGNOSIS — E1151 Type 2 diabetes mellitus with diabetic peripheral angiopathy without gangrene: Secondary | ICD-10-CM | POA: Diagnosis not present

## 2019-11-17 DIAGNOSIS — Z4781 Encounter for orthopedic aftercare following surgical amputation: Secondary | ICD-10-CM | POA: Diagnosis not present

## 2019-11-17 DIAGNOSIS — I5021 Acute systolic (congestive) heart failure: Secondary | ICD-10-CM | POA: Diagnosis not present

## 2019-11-17 DIAGNOSIS — I4891 Unspecified atrial fibrillation: Secondary | ICD-10-CM | POA: Diagnosis not present

## 2019-11-17 DIAGNOSIS — I484 Atypical atrial flutter: Secondary | ICD-10-CM | POA: Diagnosis not present

## 2019-11-17 NOTE — Telephone Encounter (Signed)
Received fax from [place] asking for refill on True Metrix Blood Glucoe Mtr, [employer] True Metrix Test Strip, Trueplus 33G lancets. Rodney [name], CMA

## 2019-11-18 DIAGNOSIS — I484 Atypical atrial flutter: Secondary | ICD-10-CM | POA: Diagnosis not present

## 2019-11-18 DIAGNOSIS — I5021 Acute systolic (congestive) heart failure: Secondary | ICD-10-CM | POA: Diagnosis not present

## 2019-11-18 DIAGNOSIS — I4891 Unspecified atrial fibrillation: Secondary | ICD-10-CM | POA: Diagnosis not present

## 2019-11-18 DIAGNOSIS — E1151 Type 2 diabetes mellitus with diabetic peripheral angiopathy without gangrene: Secondary | ICD-10-CM | POA: Diagnosis not present

## 2019-11-18 DIAGNOSIS — E1140 Type 2 diabetes mellitus with diabetic neuropathy, unspecified: Secondary | ICD-10-CM | POA: Diagnosis not present

## 2019-11-18 DIAGNOSIS — I110 Hypertensive heart disease with heart failure: Secondary | ICD-10-CM | POA: Diagnosis not present

## 2019-11-18 DIAGNOSIS — Z4781 Encounter for orthopedic aftercare following surgical amputation: Secondary | ICD-10-CM | POA: Diagnosis not present

## 2019-11-18 DIAGNOSIS — I350 Nonrheumatic aortic (valve) stenosis: Secondary | ICD-10-CM | POA: Diagnosis not present

## 2019-11-18 DIAGNOSIS — D649 Anemia, unspecified: Secondary | ICD-10-CM | POA: Diagnosis not present

## 2019-11-20 MED ORDER — RELION TRUE METRIX TEST STRIPS VI STRP: ORAL_STRIP | 3 refills | Status: DC

## 2019-11-20 MED ORDER — TRUEPLUS LANCETS 33G MISC: 3 refills | Status: DC

## 2019-11-20 MED ORDER — TRUE METRIX METER DEVI: 0 refills | Status: DC

## 2019-11-20 NOTE — Telephone Encounter (Signed)
Done

## 2019-11-21 ENCOUNTER — Other Ambulatory Visit: Payer: Self-pay | Admitting: Family Medicine

## 2019-11-21 DIAGNOSIS — IMO0002 Reserved for concepts with insufficient information to code with codable children: Secondary | ICD-10-CM

## 2019-11-24 DIAGNOSIS — M6281 Muscle weakness (generalized): Secondary | ICD-10-CM | POA: Diagnosis not present

## 2019-11-24 DIAGNOSIS — I350 Nonrheumatic aortic (valve) stenosis: Secondary | ICD-10-CM | POA: Diagnosis not present

## 2019-11-24 DIAGNOSIS — D649 Anemia, unspecified: Secondary | ICD-10-CM | POA: Diagnosis not present

## 2019-11-24 DIAGNOSIS — Z4781 Encounter for orthopedic aftercare following surgical amputation: Secondary | ICD-10-CM | POA: Diagnosis not present

## 2019-11-24 DIAGNOSIS — E1140 Type 2 diabetes mellitus with diabetic neuropathy, unspecified: Secondary | ICD-10-CM | POA: Diagnosis not present

## 2019-11-24 DIAGNOSIS — I5021 Acute systolic (congestive) heart failure: Secondary | ICD-10-CM | POA: Diagnosis not present

## 2019-11-24 DIAGNOSIS — E1151 Type 2 diabetes mellitus with diabetic peripheral angiopathy without gangrene: Secondary | ICD-10-CM | POA: Diagnosis not present

## 2019-11-24 DIAGNOSIS — R2681 Unsteadiness on feet: Secondary | ICD-10-CM | POA: Diagnosis not present

## 2019-11-24 DIAGNOSIS — Z89512 Acquired absence of left leg below knee: Secondary | ICD-10-CM | POA: Diagnosis not present

## 2019-11-24 DIAGNOSIS — I4891 Unspecified atrial fibrillation: Secondary | ICD-10-CM | POA: Diagnosis not present

## 2019-11-24 DIAGNOSIS — I484 Atypical atrial flutter: Secondary | ICD-10-CM | POA: Diagnosis not present

## 2019-11-24 DIAGNOSIS — I110 Hypertensive heart disease with heart failure: Secondary | ICD-10-CM | POA: Diagnosis not present

## 2019-11-27 DIAGNOSIS — E1151 Type 2 diabetes mellitus with diabetic peripheral angiopathy without gangrene: Secondary | ICD-10-CM | POA: Diagnosis not present

## 2019-11-27 DIAGNOSIS — I350 Nonrheumatic aortic (valve) stenosis: Secondary | ICD-10-CM | POA: Diagnosis not present

## 2019-11-27 DIAGNOSIS — Z4781 Encounter for orthopedic aftercare following surgical amputation: Secondary | ICD-10-CM | POA: Diagnosis not present

## 2019-11-27 DIAGNOSIS — I4891 Unspecified atrial fibrillation: Secondary | ICD-10-CM | POA: Diagnosis not present

## 2019-11-27 DIAGNOSIS — I110 Hypertensive heart disease with heart failure: Secondary | ICD-10-CM | POA: Diagnosis not present

## 2019-11-27 DIAGNOSIS — I484 Atypical atrial flutter: Secondary | ICD-10-CM | POA: Diagnosis not present

## 2019-11-27 DIAGNOSIS — I5021 Acute systolic (congestive) heart failure: Secondary | ICD-10-CM | POA: Diagnosis not present

## 2019-11-27 DIAGNOSIS — D649 Anemia, unspecified: Secondary | ICD-10-CM | POA: Diagnosis not present

## 2019-11-27 DIAGNOSIS — E1140 Type 2 diabetes mellitus with diabetic neuropathy, unspecified: Secondary | ICD-10-CM | POA: Diagnosis not present

## 2019-12-01 DIAGNOSIS — I484 Atypical atrial flutter: Secondary | ICD-10-CM | POA: Diagnosis not present

## 2019-12-01 DIAGNOSIS — I5021 Acute systolic (congestive) heart failure: Secondary | ICD-10-CM | POA: Diagnosis not present

## 2019-12-01 DIAGNOSIS — I110 Hypertensive heart disease with heart failure: Secondary | ICD-10-CM | POA: Diagnosis not present

## 2019-12-01 DIAGNOSIS — E1140 Type 2 diabetes mellitus with diabetic neuropathy, unspecified: Secondary | ICD-10-CM | POA: Diagnosis not present

## 2019-12-01 DIAGNOSIS — D649 Anemia, unspecified: Secondary | ICD-10-CM | POA: Diagnosis not present

## 2019-12-01 DIAGNOSIS — I350 Nonrheumatic aortic (valve) stenosis: Secondary | ICD-10-CM | POA: Diagnosis not present

## 2019-12-01 DIAGNOSIS — Z4781 Encounter for orthopedic aftercare following surgical amputation: Secondary | ICD-10-CM | POA: Diagnosis not present

## 2019-12-01 DIAGNOSIS — E1151 Type 2 diabetes mellitus with diabetic peripheral angiopathy without gangrene: Secondary | ICD-10-CM | POA: Diagnosis not present

## 2019-12-01 DIAGNOSIS — I4891 Unspecified atrial fibrillation: Secondary | ICD-10-CM | POA: Diagnosis not present

## 2019-12-07 ENCOUNTER — Ambulatory Visit: Payer: Medicare PPO | Admitting: Physician Assistant

## 2019-12-08 ENCOUNTER — Telehealth: Payer: Self-pay | Admitting: Orthopedic Surgery

## 2019-12-08 DIAGNOSIS — D649 Anemia, unspecified: Secondary | ICD-10-CM | POA: Diagnosis not present

## 2019-12-08 DIAGNOSIS — E1140 Type 2 diabetes mellitus with diabetic neuropathy, unspecified: Secondary | ICD-10-CM | POA: Diagnosis not present

## 2019-12-08 DIAGNOSIS — I110 Hypertensive heart disease with heart failure: Secondary | ICD-10-CM | POA: Diagnosis not present

## 2019-12-08 DIAGNOSIS — Z4781 Encounter for orthopedic aftercare following surgical amputation: Secondary | ICD-10-CM | POA: Diagnosis not present

## 2019-12-08 DIAGNOSIS — E1151 Type 2 diabetes mellitus with diabetic peripheral angiopathy without gangrene: Secondary | ICD-10-CM | POA: Diagnosis not present

## 2019-12-08 DIAGNOSIS — I4891 Unspecified atrial fibrillation: Secondary | ICD-10-CM | POA: Diagnosis not present

## 2019-12-08 DIAGNOSIS — I484 Atypical atrial flutter: Secondary | ICD-10-CM | POA: Diagnosis not present

## 2019-12-08 DIAGNOSIS — I5021 Acute systolic (congestive) heart failure: Secondary | ICD-10-CM | POA: Diagnosis not present

## 2019-12-08 DIAGNOSIS — I350 Nonrheumatic aortic (valve) stenosis: Secondary | ICD-10-CM | POA: Diagnosis not present

## 2019-12-08 NOTE — Telephone Encounter (Signed)
Called and lm on vm to advise verbal ok to message below. To call with any other questions.

## 2019-12-08 NOTE — Telephone Encounter (Signed)
Mark from [email or web address]  called requesting a hold for physical therapy for patient for one week. Patient will resume home health appt when [employer] (PT) returns from vacation on 12/17/19. Please call 12/30/19 back to confirm message. If unable to answer leave detailed message on secure VM at [phone].

## 2019-12-13 ENCOUNTER — Ambulatory Visit (INDEPENDENT_AMBULATORY_CARE_PROVIDER_SITE_OTHER): Payer: Medicare PPO | Admitting: Family

## 2019-12-13 ENCOUNTER — Encounter: Payer: Self-pay | Admitting: Family

## 2019-12-13 VITALS — Ht 72.0 in | Wt 227.0 lb

## 2019-12-13 DIAGNOSIS — S88119A Complete traumatic amputation at level between knee and ankle, unspecified lower leg, initial encounter: Secondary | ICD-10-CM

## 2019-12-13 DIAGNOSIS — Z89512 Acquired absence of left leg below knee: Secondary | ICD-10-CM

## 2019-12-13 NOTE — Progress Notes (Signed)
Post-Op Visit Note   Patient: Rodney [name]           Date of Birth: [birth year]           MRN: [id number] Visit Date: 12/13/2019 PCP: [name], MD  Chief Complaint:  Chief Complaint  Patient presents with  . Left Leg - Routine Post Op    09/28/19 left BKA     HPI:  HPI The patient is a 69 year old gentleman seen today status post left below knee amputation. Was casted for prosthesis just prior to arrival today with [employer]. No concerns. Will be doing home PT for gait training.  Ortho Exam Incision is well healed. Limb is well consolidated. No pressure areas. No erythema.   Visit Diagnoses:  1. Below knee amputation [facility])     Plan: continue shrinker when out of prosthesis. Will follow up as needed.   Follow-Up Instructions: Return if symptoms worsen or fail to improve.   Imaging: No results found.  Orders:  No orders of the defined types were placed in this encounter.  No orders of the defined types were placed in this encounter.    PMFS History: Patient Active Problem List   Diagnosis Date Noted  . S/P BKA (below knee amputation) unilateral, left ([place]) 11/14/2019  . High risk social situation 11/14/2019  . Atrial fibrillation ([place]) 10/03/2019  . Sepsis ([place]) 09/28/2019  . HFrEF (heart failure with reduced ejection fraction) ([place]) 09/28/2019  . Moderate aortic stenosis 09/28/2019  . Anemia in chronic illness 09/27/2019  . PVD (peripheral vascular disease) ([place])   . SOB (shortness of breath) 07/26/2018  . Peripheral edema 02/24/2018  . Umbilical hernia 27/06/5007  . Obesity 10/09/2015  . Tobacco abuse 09/07/2013  . Uncontrolled type 2 diabetes mellitus with diabetic neuropathy, with long-term current use of insulin ([place]) 09/07/2013  . Colon cancer screening 09/07/2013  . Hypercholesteremia 10/28/2010  . ERECTILE DYSFUNCTION 05/22/2009  . HYPERTENSION 01/23/2009   Past Medical History:  Diagnosis Date  . Diabetes mellitus without complication  ([place])   . HLD (hyperlipidemia)   . Hypertension     Family History  Problem Relation Age of Onset  . Alcoholism Mother   . Alcoholism Father     Past Surgical History:  Procedure Laterality Date  . AMPUTATION Left 09/28/2019   Procedure: AMPUTATION BELOW KNEE;  Surgeon: Newt Minion, MD;  Location: [place];  Service: Orthopedics;  Laterality: Left;  . CARDIOVERSION N/A 10/05/2019   Procedure: CARDIOVERSION;  Surgeon: Sanda Klein, MD;  Location: [place] ENDOSCOPY;  Service: Cardiovascular;  Laterality: N/A;  . LEFT HEART CATH AND CORONARY ANGIOGRAPHY N/A 10/03/2019   Procedure: LEFT HEART CATH AND CORONARY ANGIOGRAPHY;  Surgeon: Lorretta Harp, MD;  Location: [place] CV LAB;  Service: Cardiovascular;  Laterality: N/A;  . TEE WITHOUT CARDIOVERSION N/A 10/05/2019   Procedure: TRANSESOPHAGEAL ECHOCARDIOGRAM (TEE);  Surgeon: Sanda Klein, MD;  Location: [facility] ENDOSCOPY;  Service: Cardiovascular;  Laterality: N/A;   Social History   Occupational History  . Not on file  Tobacco Use  . Smoking status: Current Every Day Smoker    Packs/day: 0.50    Types: Cigarettes  . Smokeless tobacco: Never Used  Substance and Sexual Activity  . Alcohol use: Yes    Alcohol/week: 6.0 standard drinks    Types: 6 Standard drinks or equivalent per week  . Drug use: No  . Sexual activity: Yes    Partners: Female    Comment: monagamous stable relationship

## 2019-12-17 NOTE — Progress Notes (Deleted)
Cardiology Office Note:   Date:  12/17/2019  NAME:  Rodney [name]    MRN: [id number] DOB:  Sep 06, 1950   PCP:  Rodney Resides, MD  Cardiologist:  Pixie Casino, MD  Electrophysiologist:  None   Referring MD: Rodney Resides, MD   No chief complaint on file. ***  History of Present Illness:   Rodney [name] is a 69 y.o. male with a hx of systolic HF, CAD, moderate AS, DM, CKD III, persistent afib who presents for follow-up. Diagnosed with systolic HF, Afib, CAD during postop of L BKA. Was back in Afib and off medications at last visit in July.   Problem List 1. Systolic HF, 08-65% 11/02/4694 -LHC with RCA CTO 2. Atrial fibrillation, persistent (CHADSVASC = 5; CAD, HTN, DM, Age >33, CHF) -TEE/DCCV 10/05/2019 -developed postop after L BKA -recurrence 11/03/2019 3. CAD -RCA CTO 4. Moderate Aortic stenosis -09/2019: V max 3.06 m/s, MG 25 mmHG 5. CKD III 6. DM -A1c 9.2 -T chol 91, HDL 20, LDL 48, TG 113  Past Medical History: Past Medical History:  Diagnosis Date  . Diabetes mellitus without complication ([place])   . HLD (hyperlipidemia)   . Hypertension     Past Surgical History: Past Surgical History:  Procedure Laterality Date  . AMPUTATION Left 09/28/2019   Procedure: AMPUTATION BELOW KNEE;  Surgeon: Newt Minion, MD;  Location: [place];  Service: Orthopedics;  Laterality: Left;  . CARDIOVERSION N/A 10/05/2019   Procedure: CARDIOVERSION;  Surgeon: Sanda Klein, MD;  Location: [place] ENDOSCOPY;  Service: Cardiovascular;  Laterality: N/A;  . LEFT HEART CATH AND CORONARY ANGIOGRAPHY N/A 10/03/2019   Procedure: LEFT HEART CATH AND CORONARY ANGIOGRAPHY;  Surgeon: Lorretta Harp, MD;  Location: [place] CV LAB;  Service: Cardiovascular;  Laterality: N/A;  . TEE WITHOUT CARDIOVERSION N/A 10/05/2019   Procedure: TRANSESOPHAGEAL ECHOCARDIOGRAM (TEE);  Surgeon: Sanda Klein, MD;  Location: [facility] ENDOSCOPY;  Service: Cardiovascular;  Laterality: N/A;    Current  Medications: No outpatient medications have been marked as taking for the 12/18/19 encounter (Appointment) with O'Neal, Cassie Freer, MD.     Allergies:    Patient has no known allergies.   Social History: Social History   Socioeconomic History  . Marital status: Widowed    Spouse name: Not on file  . Number of children: Not on file  . Years of education: Not on file  . Highest education level: Not on file  Occupational History  . Not on file  Tobacco Use  . Smoking status: Current Every Day Smoker    Packs/day: 0.50    Types: Cigarettes  . Smokeless tobacco: Never Used  Substance and Sexual Activity  . Alcohol use: Yes    Alcohol/week: 6.0 standard drinks    Types: 6 Standard drinks or equivalent per week  . Drug use: No  . Sexual activity: Yes    Partners: Female    Comment: monagamous stable relationship  Other Topics Concern  . Not on file  Social History Narrative  . Not on file   Social Determinants of Health   Financial Resource Strain:   . Difficulty of Paying Living Expenses: Not on file  Food Insecurity:   . Worried About Charity fundraiser in the Last Year: Not on file  . Ran Out of Food in the Last Year: Not on file  Transportation Needs:   . Lack of Transportation (Medical): Not on file  . Lack of Transportation (Non-Medical): Not on file  Physical Activity:   . Days of Exercise per Week: Not on file  . Minutes of Exercise per Session: Not on file  Stress:   . Feeling of Stress : Not on file  Social Connections:   . Frequency of Communication with Friends and Family: Not on file  . Frequency of Social Gatherings with Friends and Family: Not on file  . Attends Religious Services: Not on file  . Active Member of Clubs or Organizations: Not on file  . Attends Archivist Meetings: Not on file  . Marital Status: Not on file     Family History: The patient's ***family history includes Alcoholism in his father and mother.  ROS:   All  other ROS reviewed and negative. Pertinent positives noted in the HPI.     EKGs/Labs/Other Studies Reviewed:   The following studies were personally reviewed by me today:  EKG:  EKG is *** ordered today.  The ekg ordered today demonstrates ***, and was personally reviewed by me.   LHC 10/03/2019 100% RCA  Echo 09/28/2019 EF 35-40%  Recent Labs: 09/26/2019: ALT 71 09/27/2019: B Natriuretic Peptide 195.1; TSH 1.215 10/09/2019: BUN 28; Creatinine, Ser 1.48; Hemoglobin 10.7; Platelets 320; Potassium 4.1; Sodium 134   Recent Lipid Panel    Component Value Date/Time   CHOL 91 09/26/2019 1736   CHOL 140 02/21/2018 1650   TRIG 113 09/26/2019 1736   HDL 20 (L) 09/26/2019 1736   HDL 33 (L) 02/21/2018 1650   CHOLHDL 4.6 09/26/2019 1736   VLDL 23 09/26/2019 1736   LDLCALC 48 09/26/2019 1736   LDLCALC 79 02/21/2018 1650   LDLDIRECT 92 01/03/2014 1514    Physical Exam:   VS:  There were no vitals taken for this visit.   Wt Readings from Last 3 Encounters:  12/13/19 227 lb (103 kg)  11/13/19 227 lb 9.6 oz (103.2 kg)  11/09/19 230 lb (104.3 kg)    General: Well nourished, well developed, in no acute distress Heart: Atraumatic, normal size  Eyes: PEERLA, EOMI  Neck: Supple, no JVD Endocrine: No thryomegaly Cardiac: Normal S1, S2; RRR; no murmurs, rubs, or gallops Lungs: Clear to auscultation bilaterally, no wheezing, rhonchi or rales  Abd: Soft, nontender, no hepatomegaly  Ext: No edema, pulses 2+ Musculoskeletal: No deformities, BUE and BLE strength normal and equal Skin: Warm and dry, no rashes   Neuro: Alert and oriented to person, place, time, and situation, CNII-XII grossly intact, no focal deficits  Psych: Normal mood and affect   ASSESSMENT:   Rodney [name] is a 69 y.o. male who presents for the following: No diagnosis found.  PLAN:   There are no diagnoses linked to this encounter.  Disposition: No follow-ups on file.  Medication Adjustments/Labs and Tests  Ordered: Current medicines are reviewed at length with the patient today.  Concerns regarding medicines are outlined above.  No orders of the defined types were placed in this encounter.  No orders of the defined types were placed in this encounter.   There are no Patient Instructions on file for this visit.   Time Spent with Patient: I have spent a total of *** minutes with patient reviewing hospital notes, telemetry, EKGs, labs and examining the patient as well as establishing an assessment and plan that was discussed with the patient.  > 50% of time was spent in direct patient care.  Signed, Addison Naegeli. Audie Box, [place]  [address] [place], [place] [address] [phone]  [phone]  12/17/2019 4:51 PM

## 2019-12-18 ENCOUNTER — Telehealth: Payer: Self-pay | Admitting: Cardiovascular Disease

## 2019-12-18 ENCOUNTER — Ambulatory Visit: Payer: Medicare PPO | Admitting: Cardiovascular Disease

## 2019-12-18 MED ORDER — WARFARIN SODIUM 5 MG PO TABS
5.0000 mg | ORAL_TABLET | Freq: Every day | ORAL | 0 refills | Status: DC
Start: 2019-12-18 — End: 2020-01-05

## 2019-12-18 NOTE — Telephone Encounter (Signed)
**Note De-Identified Rodney [name] Obfuscation** The pt came to the phone and gave verbal permission for [place] to s/w [name] concerning this phone call.  [name] states that the pt has been out of Eliquis since "sometime last week".. She also states that he could not keep his f/u appt today with Dr Audie Box because she has digestive issues a was unable to bring him because she was feeing bad.  The pt has been taking Eliquis for 6 weeks and [name] states that they cannot afford any name brand medications.  She states tht she is familiar with Warfarin and is requesting that he take that instead of Eliquis. She is aware that someone will be calling them back to discuss.

## 2019-12-18 NOTE — Telephone Encounter (Signed)
**Note De-Identified Jeanne Terrance Obfuscation** No answer so I left a message on the pts VM asking him to call Jeani Hawking back at [facility] at [phone] concerning his Eliquis. I did advise in the message that if he calls back after 3 pm today to ask to s/w Dr O'Neal's nurse or leave a message with the operator and I will call him back tomorrow.

## 2019-12-18 NOTE — Telephone Encounter (Signed)
Pt c/o medication issue:  1. Name of Medication: apixaban (ELIQUIS) 5 MG TABS tablet  2. How are you currently taking this medication (dosage and times per day)? As directed  3. Are you having a reaction (difficulty breathing--STAT)? no  4. What is your medication issue? Pt can not afford the cost of the medication.   Pt is also out of medication

## 2019-12-18 NOTE — Telephone Encounter (Signed)
Returned call to pt's wife. She states Eliquis is cost prohibitive at $146/month which pt cannot afford. He ran out of Eliquis 2 days ago. Will start warfarin 5mg  1 tab once daily. Scheduled new INR appt on 8/30.

## 2019-12-18 NOTE — Telephone Encounter (Signed)
Rodney [name] is calling back stating she accidentally told Jeani Hawking that Angelina has been out of medication longer than he actually has. She states it has only been two days that he has not had it and just wanted to callback and let her know she did not mean to lie to her. Please advise.

## 2019-12-20 DIAGNOSIS — Z89512 Acquired absence of left leg below knee: Secondary | ICD-10-CM | POA: Diagnosis not present

## 2019-12-21 DIAGNOSIS — I4891 Unspecified atrial fibrillation: Secondary | ICD-10-CM | POA: Diagnosis not present

## 2019-12-21 DIAGNOSIS — D649 Anemia, unspecified: Secondary | ICD-10-CM | POA: Diagnosis not present

## 2019-12-21 DIAGNOSIS — E1151 Type 2 diabetes mellitus with diabetic peripheral angiopathy without gangrene: Secondary | ICD-10-CM | POA: Diagnosis not present

## 2019-12-21 DIAGNOSIS — I484 Atypical atrial flutter: Secondary | ICD-10-CM | POA: Diagnosis not present

## 2019-12-21 DIAGNOSIS — I350 Nonrheumatic aortic (valve) stenosis: Secondary | ICD-10-CM | POA: Diagnosis not present

## 2019-12-21 DIAGNOSIS — I110 Hypertensive heart disease with heart failure: Secondary | ICD-10-CM | POA: Diagnosis not present

## 2019-12-21 DIAGNOSIS — E1140 Type 2 diabetes mellitus with diabetic neuropathy, unspecified: Secondary | ICD-10-CM | POA: Diagnosis not present

## 2019-12-21 DIAGNOSIS — Z4781 Encounter for orthopedic aftercare following surgical amputation: Secondary | ICD-10-CM | POA: Diagnosis not present

## 2019-12-21 DIAGNOSIS — I5021 Acute systolic (congestive) heart failure: Secondary | ICD-10-CM | POA: Diagnosis not present

## 2019-12-22 ENCOUNTER — Telehealth: Payer: Self-pay

## 2019-12-22 NOTE — Telephone Encounter (Signed)
Spoke to Ms. Minette Brine today on behalf of Mr. Luckey.  Ms. Minette Brine states that they have the paperwork and will try to drop it of on Mon 12/25/19.

## 2019-12-22 NOTE — Telephone Encounter (Signed)
-----   Message from [name], MD sent at 11/14/2019  4:56 PM EDT ----- Claiborne Billings, I gave this patient the forms to complete.  He and wife should drop them off soon.  I appreciate any help you can be - especially with eliquis, jardiance and lantus.  Maybe will need amiodarone too.  Let me know what else I need to do.  Thanks.

## 2019-12-25 DIAGNOSIS — Z89512 Acquired absence of left leg below knee: Secondary | ICD-10-CM | POA: Diagnosis not present

## 2019-12-25 DIAGNOSIS — R2681 Unsteadiness on feet: Secondary | ICD-10-CM | POA: Diagnosis not present

## 2019-12-25 DIAGNOSIS — M6281 Muscle weakness (generalized): Secondary | ICD-10-CM | POA: Diagnosis not present

## 2019-12-25 DIAGNOSIS — Z4781 Encounter for orthopedic aftercare following surgical amputation: Secondary | ICD-10-CM | POA: Diagnosis not present

## 2019-12-26 DIAGNOSIS — I4891 Unspecified atrial fibrillation: Secondary | ICD-10-CM | POA: Diagnosis not present

## 2019-12-26 DIAGNOSIS — D649 Anemia, unspecified: Secondary | ICD-10-CM | POA: Diagnosis not present

## 2019-12-26 DIAGNOSIS — E1140 Type 2 diabetes mellitus with diabetic neuropathy, unspecified: Secondary | ICD-10-CM | POA: Diagnosis not present

## 2019-12-26 DIAGNOSIS — I484 Atypical atrial flutter: Secondary | ICD-10-CM | POA: Diagnosis not present

## 2019-12-26 DIAGNOSIS — I5021 Acute systolic (congestive) heart failure: Secondary | ICD-10-CM | POA: Diagnosis not present

## 2019-12-26 DIAGNOSIS — I350 Nonrheumatic aortic (valve) stenosis: Secondary | ICD-10-CM | POA: Diagnosis not present

## 2019-12-26 DIAGNOSIS — I110 Hypertensive heart disease with heart failure: Secondary | ICD-10-CM | POA: Diagnosis not present

## 2019-12-26 DIAGNOSIS — E1151 Type 2 diabetes mellitus with diabetic peripheral angiopathy without gangrene: Secondary | ICD-10-CM | POA: Diagnosis not present

## 2019-12-26 DIAGNOSIS — Z4781 Encounter for orthopedic aftercare following surgical amputation: Secondary | ICD-10-CM | POA: Diagnosis not present

## 2019-12-27 DIAGNOSIS — I5021 Acute systolic (congestive) heart failure: Secondary | ICD-10-CM | POA: Diagnosis not present

## 2019-12-27 DIAGNOSIS — E1151 Type 2 diabetes mellitus with diabetic peripheral angiopathy without gangrene: Secondary | ICD-10-CM | POA: Diagnosis not present

## 2019-12-27 DIAGNOSIS — E1140 Type 2 diabetes mellitus with diabetic neuropathy, unspecified: Secondary | ICD-10-CM | POA: Diagnosis not present

## 2019-12-27 DIAGNOSIS — I110 Hypertensive heart disease with heart failure: Secondary | ICD-10-CM | POA: Diagnosis not present

## 2019-12-27 DIAGNOSIS — D649 Anemia, unspecified: Secondary | ICD-10-CM | POA: Diagnosis not present

## 2019-12-27 DIAGNOSIS — Z4781 Encounter for orthopedic aftercare following surgical amputation: Secondary | ICD-10-CM | POA: Diagnosis not present

## 2019-12-27 DIAGNOSIS — I484 Atypical atrial flutter: Secondary | ICD-10-CM | POA: Diagnosis not present

## 2019-12-27 DIAGNOSIS — I4891 Unspecified atrial fibrillation: Secondary | ICD-10-CM | POA: Diagnosis not present

## 2019-12-27 DIAGNOSIS — I350 Nonrheumatic aortic (valve) stenosis: Secondary | ICD-10-CM | POA: Diagnosis not present

## 2019-12-28 ENCOUNTER — Telehealth: Payer: Self-pay | Admitting: Orthopedic Surgery

## 2019-12-28 NOTE — Telephone Encounter (Signed)
Mark from [facility] called,. He would like verbal orders to extend PT 2X 4wks, 1x 2wks starting 9/5. His call back number is [phone]

## 2019-12-28 NOTE — Telephone Encounter (Signed)
I called and sw physical therapy and advised verbal ok for orders below. Pt is s/p a left BKA 09/28/19

## 2019-12-29 ENCOUNTER — Other Ambulatory Visit: Payer: Self-pay

## 2019-12-29 ENCOUNTER — Ambulatory Visit (INDEPENDENT_AMBULATORY_CARE_PROVIDER_SITE_OTHER): Payer: Medicare PPO

## 2019-12-29 DIAGNOSIS — I4819 Other persistent atrial fibrillation: Secondary | ICD-10-CM | POA: Diagnosis not present

## 2019-12-29 DIAGNOSIS — Z7901 Long term (current) use of anticoagulants: Secondary | ICD-10-CM

## 2019-12-29 DIAGNOSIS — Z5181 Encounter for therapeutic drug level monitoring: Secondary | ICD-10-CM | POA: Diagnosis not present

## 2019-12-29 HISTORY — DX: Long term (current) use of anticoagulants: Z79.01

## 2019-12-29 LAB — POCT INR: INR: 1.5 — AB (ref 2.0–3.0)

## 2019-12-29 NOTE — Patient Instructions (Signed)
Take 2 tablets today and then continue taking 1 tablet daily.  INR check in 1 week

## 2020-01-03 NOTE — Progress Notes (Deleted)
Cardiology Office Note:    Date:  01/06/2020   ID:  Rodney [name], DOB 01/15/1951, MRN [id number]  PCP:  [name], MD  Cardiologist:  Evalina Field, MD   Referring MD: [name], MD   No chief complaint on file. ***  History of Present Illness:    Rodney [name] is a 69 y.o. male with a hx of uncontrolled DM, CAD with known RCA CTO, HFrEF, and HTN. He was recently seen while admitted for sepsis secondary to LE infection requiring left BKA. Hospital course was complicated by atrial fibrillation with RVR and acute systolic and diastolic heart failure.  Following surgery, developed Afib RVR and underwent TEE-guided DCCV on 10/05/19. He was started on eliquis for a CHADSVASC 4 and amiodarone.  Echocardiogram with newly diagnosed systolic heart failure - EF 35-40%, moderate AS. Due to newly reduced EF, he underwent heart catheterization which revealed CTO of RCA, 3-4+ MAC and 30 mmHg gradient across aortic valve.  Discharge weight was 108.3 kg. He was discharged on losartan, coreg, spiro, and lasix - discharged to rehab. 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   He was seen in follow up with Dr. Audie Box on 11/03/19 and was back in Afib at that time. Fortunately, he was asymptomatic. Confusion regarding medication regimen after leaving SNF. He is also having trouble affording medications, which can make additional therapy for Afib challenging. He was ultimately switched to coumadin. Dr. Audie Box is not keen to retry DCCV until he is stable on current medications. Preference for NSR given his CHF.   He presents today for follow up.     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 Diabetes mellitus without complication ([place])   . HLD (hyperlipidemia)   . Hypertension     Past Surgical History:  Procedure Laterality Date  . AMPUTATION Left 09/28/2019   Procedure: AMPUTATION BELOW KNEE;  Surgeon: Newt Minion, MD;  Location: [facility];  Service: Orthopedics;  Laterality: Left;  . CARDIOVERSION N/A 10/05/2019   Procedure: CARDIOVERSION;  Surgeon: Sanda Klein, MD;  Location: [facility];  Service: Cardiovascular;  Laterality: N/A;  . LEFT HEART CATH AND CORONARY ANGIOGRAPHY N/A 10/03/2019   Procedure: LEFT HEART CATH AND CORONARY ANGIOGRAPHY;  Surgeon: Lorretta Harp, MD;  Location: [facility];  Service: Cardiovascular;  Laterality: N/A;  . TEE WITHOUT CARDIOVERSION N/A 10/05/2019   Procedure: TRANSESOPHAGEAL ECHOCARDIOGRAM (TEE);  Surgeon: Sanda Klein, MD;  Location: [name] ENDOSCOPY;  Service: Cardiovascular;  Laterality: N/A;    Current Medications: No outpatient medications have been marked as taking for the 01/11/20 encounter (Appointment) with Rob Hickman,  Tami Lin, PA.     Allergies:   Patient has no known allergies.   Social History   Socioeconomic History  . Marital status: Widowed    Spouse name: Not on file  . Number of children: Not on file  . Years of education: Not on file  . Highest education level: Not on file  Occupational History  . Not on file  Tobacco Use  . Smoking status: Current Every Day Smoker    Packs/day: 0.50    Types:  Cigarettes  . Smokeless tobacco: Never Used  Substance and Sexual Activity  . Alcohol use: Yes    Alcohol/week: 6.0 standard drinks    Types: 6 Standard drinks or equivalent per week  . Drug use: No  . Sexual activity: Yes    Partners: Female    Comment: monagamous stable relationship  Other Topics Concern  . Not on file  Social History Narrative  . Not on file   Social Determinants of Health   Financial Resource Strain:   . Difficulty of Paying Living Expenses: Not on file  Food Insecurity:   . Worried About Charity fundraiser in the Last Year: Not on file  . Ran Out of Food in the Last Year: Not on file  Transportation Needs:   . Lack of Transportation (Medical): Not on file  . Lack of Transportation (Non-Medical): Not on file  Physical Activity:   . Days of Exercise per Week: Not on file  . Minutes of Exercise per Session: Not on file  Stress:   . Feeling of Stress : Not on file  Social Connections:   . Frequency of Communication with Friends and Family: Not on file  . Frequency of Social Gatherings with Friends and Family: Not on file  . Attends Religious Services: Not on file  . Active Member of Clubs or Organizations: Not on file  . Attends Archivist Meetings: Not on file  . Marital Status: Not on file     Family History: The patient's ***family history includes Alcoholism in his father and mother.  ROS:   Please see the history of present illness.    *** All other systems reviewed and are negative.  EKGs/Labs/Other Studies Reviewed:    The following studies were reviewed today:  TTE 09/28/2019 1. Assessment of his LV function is difficult. Consider repeat echo with  definity contrast once HR is slower . Marland Kitchen Left ventricular ejection  fraction, by estimation, is 35 to 40%. The left ventricle has moderately  decreased function. The left ventricle  demonstrates global hypokinesis. Left ventricular diastolic parameters are  indeterminate.  2.  Right ventricular systolic function is mildly reduced. The right  ventricular size is normal.  3. Left atrial size was mildly dilated.  4. Right atrial size was mildly dilated.  5. The mitral valve is normal in structure. Mild mitral valve  regurgitation. No evidence of mitral stenosis.  6. The aortic valve is not well visualized in the short axis view but is  likely a 3 leaflet valve .Marland Kitchen The aortic valve is tricuspid. Aortic valve  regurgitation is not visualized. Moderate aortic valve stenosis.   [place] 10/03/2019 Mr. Depascale has mild nonobstructive disease in his left system including the AV groove circumflex and LAD with a chronically occluded RCA, faint grade 1 left to right collaterals and a 30 mm peak to peak gradient across his aortic valve. 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 His LVEDP was 35. At  this point, I would recommend medical therapy.  EKG:  EKG is *** ordered today.  The ekg ordered today demonstrates ***  Recent Labs: 09/26/2019: ALT 71 09/27/2019: B Natriuretic Peptide 195.1; TSH 1.215 10/09/2019: BUN 28; Creatinine, Ser 1.48; Hemoglobin 10.7; Platelets 320; Potassium 4.1; Sodium 134  Recent Lipid Panel    Component Value Date/Time   CHOL 91 09/26/2019 1736   CHOL 140 02/21/2018 1650   TRIG 113 09/26/2019 1736   HDL 20 (L) 09/26/2019 1736   HDL 33 (L) 02/21/2018 1650   CHOLHDL 4.6 09/26/2019 1736   VLDL 23 09/26/2019 1736   LDLCALC 48 09/26/2019 1736   LDLCALC 79 02/21/2018 1650   LDLDIRECT 92 01/03/2014 1514    Physical Exam:    VS:  There were no vitals taken for this visit.    Wt Readings from Last 3 Encounters:  12/13/19 227 lb (103 kg)  11/13/19 227 lb 9.6 oz (103.2 kg)  11/09/19 230 lb (104.3 kg)     GEN: *** Well nourished, well developed in no acute distress HEENT: Normal NECK: No JVD; No carotid bruits LYMPHATICS: No lymphadenopathy CARDIAC: ***RRR, no murmurs, rubs, gallops RESPIRATORY:  Clear to auscultation without rales,  wheezing or rhonchi  ABDOMEN: Soft, non-tender, non-distended MUSCULOSKELETAL:  No edema; No deformity  SKIN: Warm and dry NEUROLOGIC:  Alert and oriented x 3 PSYCHIATRIC:  Normal affect   ASSESSMENT:    No diagnosis found. PLAN:    In order of problems listed above:  No diagnosis found.   Medication Adjustments/Labs and Tests Ordered: Current medicines are reviewed at length with the patient today.  Concerns regarding medicines are outlined above.  No orders of the defined types were placed in this encounter.  No orders of the defined types were placed in this encounter.   Signed, Ledora Bottcher, [place]  01/06/2020 12:11 PM    [facility]

## 2020-01-04 DIAGNOSIS — Z4781 Encounter for orthopedic aftercare following surgical amputation: Secondary | ICD-10-CM | POA: Diagnosis not present

## 2020-01-04 DIAGNOSIS — I484 Atypical atrial flutter: Secondary | ICD-10-CM | POA: Diagnosis not present

## 2020-01-04 DIAGNOSIS — I4891 Unspecified atrial fibrillation: Secondary | ICD-10-CM | POA: Diagnosis not present

## 2020-01-04 DIAGNOSIS — E1140 Type 2 diabetes mellitus with diabetic neuropathy, unspecified: Secondary | ICD-10-CM | POA: Diagnosis not present

## 2020-01-04 DIAGNOSIS — I350 Nonrheumatic aortic (valve) stenosis: Secondary | ICD-10-CM | POA: Diagnosis not present

## 2020-01-04 DIAGNOSIS — I110 Hypertensive heart disease with heart failure: Secondary | ICD-10-CM | POA: Diagnosis not present

## 2020-01-04 DIAGNOSIS — E1151 Type 2 diabetes mellitus with diabetic peripheral angiopathy without gangrene: Secondary | ICD-10-CM | POA: Diagnosis not present

## 2020-01-04 DIAGNOSIS — D649 Anemia, unspecified: Secondary | ICD-10-CM | POA: Diagnosis not present

## 2020-01-04 DIAGNOSIS — I5021 Acute systolic (congestive) heart failure: Secondary | ICD-10-CM | POA: Diagnosis not present

## 2020-01-05 ENCOUNTER — Other Ambulatory Visit: Payer: Self-pay

## 2020-01-05 ENCOUNTER — Ambulatory Visit (INDEPENDENT_AMBULATORY_CARE_PROVIDER_SITE_OTHER): Payer: Medicare PPO

## 2020-01-05 DIAGNOSIS — E1151 Type 2 diabetes mellitus with diabetic peripheral angiopathy without gangrene: Secondary | ICD-10-CM | POA: Diagnosis not present

## 2020-01-05 DIAGNOSIS — Z7901 Long term (current) use of anticoagulants: Secondary | ICD-10-CM | POA: Diagnosis not present

## 2020-01-05 DIAGNOSIS — Z4781 Encounter for orthopedic aftercare following surgical amputation: Secondary | ICD-10-CM | POA: Diagnosis not present

## 2020-01-05 DIAGNOSIS — I110 Hypertensive heart disease with heart failure: Secondary | ICD-10-CM | POA: Diagnosis not present

## 2020-01-05 DIAGNOSIS — I4891 Unspecified atrial fibrillation: Secondary | ICD-10-CM | POA: Diagnosis not present

## 2020-01-05 DIAGNOSIS — D649 Anemia, unspecified: Secondary | ICD-10-CM | POA: Diagnosis not present

## 2020-01-05 DIAGNOSIS — E1140 Type 2 diabetes mellitus with diabetic neuropathy, unspecified: Secondary | ICD-10-CM | POA: Diagnosis not present

## 2020-01-05 DIAGNOSIS — I350 Nonrheumatic aortic (valve) stenosis: Secondary | ICD-10-CM | POA: Diagnosis not present

## 2020-01-05 DIAGNOSIS — I4819 Other persistent atrial fibrillation: Secondary | ICD-10-CM

## 2020-01-05 DIAGNOSIS — I5021 Acute systolic (congestive) heart failure: Secondary | ICD-10-CM | POA: Diagnosis not present

## 2020-01-05 DIAGNOSIS — I484 Atypical atrial flutter: Secondary | ICD-10-CM | POA: Diagnosis not present

## 2020-01-05 LAB — POCT INR: INR: 1.6 — AB (ref 2.0–3.0)

## 2020-01-05 MED ORDER — WARFARIN SODIUM 5 MG PO TABS: 5.0000 mg | ORAL_TABLET | Freq: Every day | ORAL | 1 refills | Status: DC

## 2020-01-05 NOTE — Patient Instructions (Signed)
Take 2 tablets today and then increase to 1 tablet daily except Wednesday take 1.5 tablets.  INR check in 1 week

## 2020-01-11 ENCOUNTER — Ambulatory Visit: Payer: Medicare PPO | Admitting: Physician Assistant

## 2020-01-12 ENCOUNTER — Ambulatory Visit (INDEPENDENT_AMBULATORY_CARE_PROVIDER_SITE_OTHER): Payer: Medicare PPO

## 2020-01-12 ENCOUNTER — Other Ambulatory Visit: Payer: Self-pay

## 2020-01-12 DIAGNOSIS — I4819 Other persistent atrial fibrillation: Secondary | ICD-10-CM | POA: Diagnosis not present

## 2020-01-12 DIAGNOSIS — Z7901 Long term (current) use of anticoagulants: Secondary | ICD-10-CM

## 2020-01-12 LAB — POCT INR: INR: 2.2 (ref 2.0–3.0)

## 2020-01-12 NOTE — Patient Instructions (Signed)
Continue taking 1 tablet daily except Wednesday take 1.5 tablets.  INR check in 2 weeks

## 2020-01-13 DIAGNOSIS — Z4781 Encounter for orthopedic aftercare following surgical amputation: Secondary | ICD-10-CM | POA: Diagnosis not present

## 2020-01-13 DIAGNOSIS — I5021 Acute systolic (congestive) heart failure: Secondary | ICD-10-CM | POA: Diagnosis not present

## 2020-01-13 DIAGNOSIS — I484 Atypical atrial flutter: Secondary | ICD-10-CM | POA: Diagnosis not present

## 2020-01-13 DIAGNOSIS — I350 Nonrheumatic aortic (valve) stenosis: Secondary | ICD-10-CM | POA: Diagnosis not present

## 2020-01-13 DIAGNOSIS — D649 Anemia, unspecified: Secondary | ICD-10-CM | POA: Diagnosis not present

## 2020-01-13 DIAGNOSIS — E1151 Type 2 diabetes mellitus with diabetic peripheral angiopathy without gangrene: Secondary | ICD-10-CM | POA: Diagnosis not present

## 2020-01-13 DIAGNOSIS — E1140 Type 2 diabetes mellitus with diabetic neuropathy, unspecified: Secondary | ICD-10-CM | POA: Diagnosis not present

## 2020-01-13 DIAGNOSIS — I4891 Unspecified atrial fibrillation: Secondary | ICD-10-CM | POA: Diagnosis not present

## 2020-01-13 DIAGNOSIS — I110 Hypertensive heart disease with heart failure: Secondary | ICD-10-CM | POA: Diagnosis not present

## 2020-01-17 ENCOUNTER — Other Ambulatory Visit: Payer: Self-pay | Admitting: Family Medicine

## 2020-01-23 DIAGNOSIS — I350 Nonrheumatic aortic (valve) stenosis: Secondary | ICD-10-CM | POA: Diagnosis not present

## 2020-01-23 DIAGNOSIS — I110 Hypertensive heart disease with heart failure: Secondary | ICD-10-CM | POA: Diagnosis not present

## 2020-01-23 DIAGNOSIS — I4891 Unspecified atrial fibrillation: Secondary | ICD-10-CM | POA: Diagnosis not present

## 2020-01-23 DIAGNOSIS — I484 Atypical atrial flutter: Secondary | ICD-10-CM | POA: Diagnosis not present

## 2020-01-23 DIAGNOSIS — E1140 Type 2 diabetes mellitus with diabetic neuropathy, unspecified: Secondary | ICD-10-CM | POA: Diagnosis not present

## 2020-01-23 DIAGNOSIS — D649 Anemia, unspecified: Secondary | ICD-10-CM | POA: Diagnosis not present

## 2020-01-23 DIAGNOSIS — I5021 Acute systolic (congestive) heart failure: Secondary | ICD-10-CM | POA: Diagnosis not present

## 2020-01-23 DIAGNOSIS — Z4781 Encounter for orthopedic aftercare following surgical amputation: Secondary | ICD-10-CM | POA: Diagnosis not present

## 2020-01-23 DIAGNOSIS — E1151 Type 2 diabetes mellitus with diabetic peripheral angiopathy without gangrene: Secondary | ICD-10-CM | POA: Diagnosis not present

## 2020-01-25 DIAGNOSIS — M6281 Muscle weakness (generalized): Secondary | ICD-10-CM | POA: Diagnosis not present

## 2020-01-25 DIAGNOSIS — Z4781 Encounter for orthopedic aftercare following surgical amputation: Secondary | ICD-10-CM | POA: Diagnosis not present

## 2020-01-25 DIAGNOSIS — R2681 Unsteadiness on feet: Secondary | ICD-10-CM | POA: Diagnosis not present

## 2020-01-25 DIAGNOSIS — Z89512 Acquired absence of left leg below knee: Secondary | ICD-10-CM | POA: Diagnosis not present

## 2020-01-26 ENCOUNTER — Other Ambulatory Visit: Payer: Self-pay

## 2020-01-26 ENCOUNTER — Ambulatory Visit (INDEPENDENT_AMBULATORY_CARE_PROVIDER_SITE_OTHER): Payer: Medicare PPO

## 2020-01-26 DIAGNOSIS — I4819 Other persistent atrial fibrillation: Secondary | ICD-10-CM | POA: Diagnosis not present

## 2020-01-26 DIAGNOSIS — I110 Hypertensive heart disease with heart failure: Secondary | ICD-10-CM | POA: Diagnosis not present

## 2020-01-26 DIAGNOSIS — I5021 Acute systolic (congestive) heart failure: Secondary | ICD-10-CM | POA: Diagnosis not present

## 2020-01-26 DIAGNOSIS — I484 Atypical atrial flutter: Secondary | ICD-10-CM | POA: Diagnosis not present

## 2020-01-26 DIAGNOSIS — Z7901 Long term (current) use of anticoagulants: Secondary | ICD-10-CM

## 2020-01-26 DIAGNOSIS — I350 Nonrheumatic aortic (valve) stenosis: Secondary | ICD-10-CM | POA: Diagnosis not present

## 2020-01-26 DIAGNOSIS — E1151 Type 2 diabetes mellitus with diabetic peripheral angiopathy without gangrene: Secondary | ICD-10-CM | POA: Diagnosis not present

## 2020-01-26 DIAGNOSIS — E1140 Type 2 diabetes mellitus with diabetic neuropathy, unspecified: Secondary | ICD-10-CM | POA: Diagnosis not present

## 2020-01-26 DIAGNOSIS — Z4781 Encounter for orthopedic aftercare following surgical amputation: Secondary | ICD-10-CM | POA: Diagnosis not present

## 2020-01-26 DIAGNOSIS — I4891 Unspecified atrial fibrillation: Secondary | ICD-10-CM | POA: Diagnosis not present

## 2020-01-26 DIAGNOSIS — D649 Anemia, unspecified: Secondary | ICD-10-CM | POA: Diagnosis not present

## 2020-01-26 LAB — POCT INR: INR: 1.7 — AB (ref 2.0–3.0)

## 2020-01-26 NOTE — Patient Instructions (Signed)
Take 1 tonight and then Continue taking 1 tablet daily except Wednesday take 1.5 tablets.  INR check in 2 weeks

## 2020-02-02 DIAGNOSIS — E1140 Type 2 diabetes mellitus with diabetic neuropathy, unspecified: Secondary | ICD-10-CM | POA: Diagnosis not present

## 2020-02-02 DIAGNOSIS — Z4781 Encounter for orthopedic aftercare following surgical amputation: Secondary | ICD-10-CM | POA: Diagnosis not present

## 2020-02-02 DIAGNOSIS — I350 Nonrheumatic aortic (valve) stenosis: Secondary | ICD-10-CM | POA: Diagnosis not present

## 2020-02-02 DIAGNOSIS — D649 Anemia, unspecified: Secondary | ICD-10-CM | POA: Diagnosis not present

## 2020-02-02 DIAGNOSIS — I4891 Unspecified atrial fibrillation: Secondary | ICD-10-CM | POA: Diagnosis not present

## 2020-02-02 DIAGNOSIS — E1151 Type 2 diabetes mellitus with diabetic peripheral angiopathy without gangrene: Secondary | ICD-10-CM | POA: Diagnosis not present

## 2020-02-02 DIAGNOSIS — I110 Hypertensive heart disease with heart failure: Secondary | ICD-10-CM | POA: Diagnosis not present

## 2020-02-02 DIAGNOSIS — I5021 Acute systolic (congestive) heart failure: Secondary | ICD-10-CM | POA: Diagnosis not present

## 2020-02-02 DIAGNOSIS — I484 Atypical atrial flutter: Secondary | ICD-10-CM | POA: Diagnosis not present

## 2020-02-08 DIAGNOSIS — I350 Nonrheumatic aortic (valve) stenosis: Secondary | ICD-10-CM | POA: Diagnosis not present

## 2020-02-08 DIAGNOSIS — D649 Anemia, unspecified: Secondary | ICD-10-CM | POA: Diagnosis not present

## 2020-02-08 DIAGNOSIS — Z4781 Encounter for orthopedic aftercare following surgical amputation: Secondary | ICD-10-CM | POA: Diagnosis not present

## 2020-02-08 DIAGNOSIS — I110 Hypertensive heart disease with heart failure: Secondary | ICD-10-CM | POA: Diagnosis not present

## 2020-02-08 DIAGNOSIS — E1140 Type 2 diabetes mellitus with diabetic neuropathy, unspecified: Secondary | ICD-10-CM | POA: Diagnosis not present

## 2020-02-08 DIAGNOSIS — I484 Atypical atrial flutter: Secondary | ICD-10-CM | POA: Diagnosis not present

## 2020-02-08 DIAGNOSIS — I5021 Acute systolic (congestive) heart failure: Secondary | ICD-10-CM | POA: Diagnosis not present

## 2020-02-08 DIAGNOSIS — I4891 Unspecified atrial fibrillation: Secondary | ICD-10-CM | POA: Diagnosis not present

## 2020-02-08 DIAGNOSIS — E1151 Type 2 diabetes mellitus with diabetic peripheral angiopathy without gangrene: Secondary | ICD-10-CM | POA: Diagnosis not present

## 2020-02-09 ENCOUNTER — Ambulatory Visit (INDEPENDENT_AMBULATORY_CARE_PROVIDER_SITE_OTHER): Payer: Medicare PPO

## 2020-02-09 DIAGNOSIS — I4819 Other persistent atrial fibrillation: Secondary | ICD-10-CM | POA: Diagnosis not present

## 2020-02-09 DIAGNOSIS — Z7901 Long term (current) use of anticoagulants: Secondary | ICD-10-CM

## 2020-02-09 LAB — POCT INR: INR: 2.7 (ref 2.0–3.0)

## 2020-02-09 NOTE — Patient Instructions (Signed)
Continue taking 1 tablet daily except Wednesday take 1.5 tablets.  INR check in 4 weeks

## 2020-02-15 ENCOUNTER — Telehealth: Payer: Self-pay | Admitting: Cardiovascular Disease

## 2020-02-15 ENCOUNTER — Encounter: Payer: Self-pay | Admitting: Cardiology

## 2020-02-15 ENCOUNTER — Other Ambulatory Visit: Payer: Self-pay

## 2020-02-15 ENCOUNTER — Ambulatory Visit (INDEPENDENT_AMBULATORY_CARE_PROVIDER_SITE_OTHER): Payer: Medicare PPO | Admitting: Cardiology

## 2020-02-15 VITALS — BP 90/50 | HR 65 | Ht 72.0 in | Wt 250.0 lb

## 2020-02-15 DIAGNOSIS — Z7901 Long term (current) use of anticoagulants: Secondary | ICD-10-CM | POA: Diagnosis not present

## 2020-02-15 DIAGNOSIS — Z79899 Other long term (current) drug therapy: Secondary | ICD-10-CM

## 2020-02-15 DIAGNOSIS — I428 Other cardiomyopathies: Secondary | ICD-10-CM | POA: Diagnosis not present

## 2020-02-15 DIAGNOSIS — I429 Cardiomyopathy, unspecified: Secondary | ICD-10-CM | POA: Insufficient documentation

## 2020-02-15 DIAGNOSIS — N183 Chronic kidney disease, stage 3 unspecified: Secondary | ICD-10-CM | POA: Diagnosis not present

## 2020-02-15 DIAGNOSIS — I2510 Atherosclerotic heart disease of native coronary artery without angina pectoris: Secondary | ICD-10-CM | POA: Diagnosis not present

## 2020-02-15 DIAGNOSIS — I4819 Other persistent atrial fibrillation: Secondary | ICD-10-CM | POA: Diagnosis not present

## 2020-02-15 DIAGNOSIS — I739 Peripheral vascular disease, unspecified: Secondary | ICD-10-CM

## 2020-02-15 DIAGNOSIS — Z720 Tobacco use: Secondary | ICD-10-CM

## 2020-02-15 DIAGNOSIS — I10 Essential (primary) hypertension: Secondary | ICD-10-CM

## 2020-02-15 DIAGNOSIS — I350 Nonrheumatic aortic (valve) stenosis: Secondary | ICD-10-CM

## 2020-02-15 DIAGNOSIS — E1122 Type 2 diabetes mellitus with diabetic chronic kidney disease: Secondary | ICD-10-CM | POA: Insufficient documentation

## 2020-02-15 LAB — CBC

## 2020-02-15 NOTE — Assessment & Plan Note (Signed)
Lt BKA June 2021

## 2020-02-15 NOTE — Assessment & Plan Note (Signed)
Mean gradient 25 mmHg, peak 37.5 mmHg

## 2020-02-15 NOTE — Assessment & Plan Note (Signed)
The patient has converted to NSR since his last OV.  His wife will call us back with an accurate medication list.  If he is on Amiodarone will continue this, if not resume it.

## 2020-02-15 NOTE — Assessment & Plan Note (Signed)
Mixed-secondary to PAF and CAD.  EF 35-40% in AF June 2021

## 2020-02-15 NOTE — Assessment & Plan Note (Signed)
Hypotensive today- decrease Aldactone to 12.5 mg QD, change Losartan to Q PM, check CBC and BMP

## 2020-02-15 NOTE — Telephone Encounter (Signed)
Sent to [facility] as Juluis Rainier

## 2020-02-15 NOTE — Addendum Note (Signed)
Addended by: Vennie Homans on: 02/15/2020 10:19 AM   Modules accepted: Orders

## 2020-02-15 NOTE — Progress Notes (Signed)
Cardiology Office Note:    Date:  02/15/2020   ID:  Rodney [name], DOB 1950/10/28, MRN [id number]  PCP:  [name], MD  Cardiologist:  Evalina Field, MD  Electrophysiologist:  None   Referring MD: [name], MD   No chief complaint on file.   History of Present Illness:    Rodney [name] is a 69 y.o. male with a hx of PAF, CAD, hypertension, moderate AS, insulin-dependent diabetes, PVD status post left BKA, chronic renal insufficiency stage III, and smoking.  The patient had a left BKA 09/28/2019.  He had PAF postop.  Echocardiogram showed cardiomyopathy with an ejection fraction of 35 to 40%.  Diagnostic catheterization done 10/03/2019 showed a CTO of his RCA but no significant left system disease.  It is felt he has a mixed cardiomyopathy.  The patient did have TEE cardioversion on 10/05/2019.  He was discharged on Eliquis but could not afford it.  As an outpatient he was seen in July 2021, he was back in atrial fibrillation and not on anticoagulation.  Notes indicate amiodarone was stopped at that time.  The plan was to get him back on anticoagulation and attempt another cardioversion.  Since then the patient has been put on warfarin and is followed in our anticoagulation clinic.  He has been compliant with his other medications.  He presented to the office today to consider cardioversion now that he is on anticoagulation.  In the office today he is in normal sinus rhythm.  They did not bring her medications with him.  His medication list indicates he is on amiodarone although I thought this was to be discontinued.  Patient's wife will call us back later today with an accurate list of his medications.  In any event he is in sinus rhythm and he feels well, he denies any unusual shortness of breath palpitations.  His blood pressure is low in the office with a systolic of 90.  Past Medical History:  Diagnosis Date  . Diabetes mellitus without complication ([place])   . HLD  (hyperlipidemia)   . Hypertension     Past Surgical History:  Procedure Laterality Date  . AMPUTATION Left 09/28/2019   Procedure: AMPUTATION BELOW KNEE;  Surgeon: Newt Minion, MD;  Location: [facility];  Service: Orthopedics;  Laterality: Left;  . CARDIOVERSION N/A 10/05/2019   Procedure: CARDIOVERSION;  Surgeon: Sanda Klein, MD;  Location: [facility]OSCOPY;  Service: Cardiovascular;  Laterality: N/A;  . LEFT HEART CATH AND CORONARY ANGIOGRAPHY N/A 10/03/2019   Procedure: LEFT HEART CATH AND CORONARY ANGIOGRAPHY;  Surgeon: Lorretta Harp, MD;  Location: [facility] [facility];  Service: Cardiovascular;  Laterality: N/A;  . TEE WITHOUT CARDIOVERSION N/A 10/05/2019   Procedure: TRANSESOPHAGEAL ECHOCARDIOGRAM (TEE);  Surgeon: Sanda Klein, MD;  Location: [facility]OSCOPY;  Service: Cardiovascular;  Laterality: N/A;    Current Medications: Current Meds  Medication Sig  . amiodarone (PACERONE) 200 MG tablet Take 1 tablet (200 mg total) by mouth daily.  Marland Kitchen aspirin EC 81 MG tablet Take 1 tablet (81 mg total) by mouth daily. Swallow whole.  Marland Kitchen atorvastatin (LIPITOR) 80 MG tablet Take 1 tablet (80 mg total) by mouth daily.  . Blood Glucose Monitoring Suppl (TRUE METRIX METER) DEVI Use to test blood sugar three times daily.  . Blood Glucose Monitoring Suppl (TRUE METRIX METER) w/Device KIT USE AS DIRECTED  . carvedilol (COREG) 12.5 MG tablet Take 1 tablet (12.5 mg total) by mouth 2 (two) times daily with  a meal.  . furosemide (LASIX) 20 MG tablet Take 1 tablet (20 mg total) by mouth daily.  Marland Kitchen glucose blood (RELION TRUE METRIX TEST STRIPS) test strip Use to test blood sugar three times per day.  . insulin aspart protamine- aspart (NOVOLOG MIX 70/30) (70-30) 100 UNIT/ML injection Inject into the skin. 30 units q breakfast and 15 units q supper.  Only use until we can get you lantus  . losartan (COZAAR) 25 MG tablet Take 1 tablet (25 mg total) by mouth daily. Duplicate.  Losartan replaces lisinopril.  Please  discontinue the lisinopril  . metFORMIN (GLUCOPHAGE) 1000 MG tablet TAKE 1 TABLET TWICE DAILY WITH A MEAL  . spironolactone (ALDACTONE) 25 MG tablet Take 12.5 mg by mouth daily.  . TRUEplus Lancets 33G MISC Use to test blood sugar three times per day.  . warfarin (COUMADIN) 5 MG tablet Take 1 tablet (5 mg total) by mouth daily.  . [DISCONTINUED] spironolactone (ALDACTONE) 25 MG tablet Take 1 tablet (25 mg total) by mouth daily. (Patient taking differently: Take 12.5 mg by mouth daily. )     Allergies:   Patient has no known allergies.   Social History   Socioeconomic History  . Marital status: Widowed    Spouse name: Not on file  . Number of children: Not on file  . Years of education: Not on file  . Highest education level: Not on file  Occupational History  . Not on file  Tobacco Use  . Smoking status: Current Every Day Smoker    Packs/day: 0.50    Types: Cigarettes  . Smokeless tobacco: Never Used  Substance and Sexual Activity  . Alcohol use: Yes    Alcohol/week: 6.0 standard drinks    Types: 6 Standard drinks or equivalent per week  . Drug use: No  . Sexual activity: Yes    Partners: Female    Comment: monagamous stable relationship  Other Topics Concern  . Not on file  Social History Narrative  . Not on file   Social Determinants of Health   Financial Resource Strain:   . Difficulty of Paying Living Expenses: Not on file  Food Insecurity:   . Worried About Charity fundraiser in the Last Year: Not on file  . Ran Out of Food in the Last Year: Not on file  Transportation Needs:   . Lack of Transportation (Medical): Not on file  . Lack of Transportation (Non-Medical): Not on file  Physical Activity:   . Days of Exercise per Week: Not on file  . Minutes of Exercise per Session: Not on file  Stress:   . Feeling of Stress : Not on file  Social Connections:   . Frequency of Communication with Friends and Family: Not on file  . Frequency of Social Gatherings with  Friends and Family: Not on file  . Attends Religious Services: Not on file  . Active Member of Clubs or Organizations: Not on file  . Attends Archivist Meetings: Not on file  . Marital Status: Not on file     Family History: The patient's family history includes Alcoholism in his father and mother.  ROS:   Please see the history of present illness.     All other systems reviewed and are negative.  EKGs/Labs/Other Studies Reviewed:    The following studies were reviewed today: Echo 09/28/2019- IMPRESSIONS    1. Assessment of his LV function is difficult. Consider repeat echo with  definity contrast once HR is  slower . Marland Kitchen Left ventricular ejection  fraction, by estimation, is 35 to 40%. The left ventricle has moderately  decreased function. The left ventricle  demonstrates global hypokinesis. Left ventricular diastolic parameters are  indeterminate.  2. Right ventricular systolic function is mildly reduced. The right  ventricular size is normal.  3. Left atrial size was mildly dilated.  4. Right atrial size was mildly dilated.  5. The mitral valve is normal in structure. Mild mitral valve  regurgitation. No evidence of mitral stenosis.  6. The aortic valve is not well visualized in the short axis view but is  likely a 3 leaflet valve .Marland Kitchen The aortic valve is tricuspid. Aortic valve  regurgitation is not visualized. Moderate aortic valve stenosis.  EKG:  EKG is ordered today.  The ekg ordered today demonstrates NSR, HR 65, QTc 451  Recent Labs: 09/26/2019: ALT 71 09/27/2019: B Natriuretic Peptide 195.1; TSH 1.215 10/09/2019: BUN 28; Creatinine, Ser 1.48; Hemoglobin 10.7; Platelets 320; Potassium 4.1; Sodium 134  Recent Lipid Panel    Component Value Date/Time   CHOL 91 09/26/2019 1736   CHOL 140 02/21/2018 1650   TRIG 113 09/26/2019 1736   HDL 20 (L) 09/26/2019 1736   HDL 33 (L) 02/21/2018 1650   CHOLHDL 4.6 09/26/2019 1736   VLDL 23 09/26/2019 1736   LDLCALC  48 09/26/2019 1736   LDLCALC 79 02/21/2018 1650   LDLDIRECT 92 01/03/2014 1514    Physical Exam:    VS:  BP (!) 90/50   Pulse 65   Ht 6' (1.829 m)   Wt 250 lb (113.4 kg)   BMI 33.91 kg/m     Wt Readings from Last 3 Encounters:  02/15/20 250 lb (113.4 kg)  12/13/19 227 lb (103 kg)  11/13/19 227 lb 9.6 oz (103.2 kg)     GEN:  Well nourished, caucasian male who presents in a wheelchair,  in no acute distress HEENT: Normal CARDIAC: RRR, 2/6 systolic murmur AOV and LSB rubs, gallops RESPIRATORY:  Decreased breath sounds and scatter rhonchi ABDOMEN: Soft, non-tender, non-distended MUSCULOSKELETAL:  Trace RLE edema, LBKA SKIN: Warm and dry NEUROLOGIC:  Alert and oriented x 3 PSYCHIATRIC:  Normal affect   ASSESSMENT:    Atrial fibrillation (HCC) The patient has converted to NSR since his last OV.  His wife will call us back with an accurate medication list.  If he is on Amiodarone will continue this, if not resume it.  Long term (current) use of anticoagulants Followed in the Coumadin clinic- CHADS VASc=5  Cardiomyopathy ([place]) Mixed-secondary to PAF and CAD.  EF 35-40% in AF June 2021  CAD (coronary artery disease) CTO RCA at cath June 2021- no other significant CAD  Moderate aortic stenosis Mean gradient 25 mmHg, peak 37.5 mmHg  CRI (chronic renal insufficiency), stage 3 (moderate) (HCC) Check BMP today  PVD (peripheral vascular disease) ([place]) Lt BKA June 2021  Tobacco abuse Continues to smoke  Essential hypertension Hypotensive today- decrease Aldactone to 12.5 mg QD, change Losartan to Q PM, check CBC and BMP  PLAN:    Review meds- ? Amiodarone- check BMP and CBC, decrease Aldactone and change Losartan to Q PM. Virtual f/u with me in 4 weeks to review home B/Ps.    Medication Adjustments/Labs and Tests Ordered: Current medicines are reviewed at length with the patient today.  Concerns regarding medicines are outlined above.  Orders Placed This Encounter   Procedures  . CBC  . Basic Metabolic Panel (BMET)   No orders of  the defined types were placed in this encounter.   Patient Instructions  Medication Instructions:  TAKE- Losartan 25 mg by mouth at bedtime DECREASE- Spironolactone 12.5 mg by mouth daily  *If you need a refill on your cardiac medications before your next appointment, please call your pharmacy*   Lab Work: CBC and BMP  If you have labs (blood work) drawn today and your tests are completely normal, you will receive your results only by: Marland Kitchen MyChart Message (if you have MyChart) OR . A paper copy in the mail If you have any lab test that is abnormal or we need to change your treatment, we will call you to review the results.   Testing/Procedures: None Ordered   Follow-Up: At [facility], you and your health needs are our priority.  As part of our continuing mission to provide you with exceptional heart care, we have created designated Provider Care Teams.  These Care Teams include your primary Cardiologist (physician) and Advanced Practice Providers (APPs -  Physician Assistants and Nurse Practitioners) who all work together to provide you with the care you need, when you need it.  We recommend signing up for the patient portal called "MyChart".  Sign up information is provided on this After Visit Summary.  MyChart is used to connect with patients for Virtual Visits (Telemedicine).  Patients are able to view lab/test results, encounter notes, upcoming appointments, etc.  Non-urgent messages can be sent to your provider as well.   To learn more about what you can do with MyChart, go to [email or web address].    Your next appointment:   November 11th @ 8:45 am  The format for your next appointment:   Virtual Visit   Provider:   You will see one of the following Advanced Practice Providers on your designated Care Team:    Kerin Ransom, [name]   Other Instructions Please give our office a call to reviewed  medications over the telephone     Signed, Kerin Ransom, [place]  02/15/2020 9:11 AM    [place]

## 2020-02-15 NOTE — Assessment & Plan Note (Signed)
Followed in the Coumadin clinic- CHADS VASc=5

## 2020-02-15 NOTE — Assessment & Plan Note (Signed)
CTO RCA at cath June 2021- no other significant CAD

## 2020-02-15 NOTE — Telephone Encounter (Signed)
Patient's wife calling in to let Kerin Ransom know that the medication list he sent them home with is up to date. Patient had an appointment with Lurena Joiner this morning.

## 2020-02-15 NOTE — Assessment & Plan Note (Signed)
Check BMP today 

## 2020-02-15 NOTE — Patient Instructions (Signed)
Medication Instructions:  TAKE- Losartan 25 mg by mouth at bedtime DECREASE- Spironolactone 12.5 mg by mouth daily  *If you need a refill on your cardiac medications before your next appointment, please call your pharmacy*   Lab Work: CBC and BMP  If you have labs (blood work) drawn today and your tests are completely normal, you will receive your results only by: Marland Kitchen MyChart Message (if you have MyChart) OR . A paper copy in the mail If you have any lab test that is abnormal or we need to change your treatment, we will call you to review the results.   Testing/Procedures: None Ordered   Follow-Up: At [facility], you and your health needs are our priority.  As part of our continuing mission to provide you with exceptional heart care, we have created designated Provider Care Teams.  These Care Teams include your primary Cardiologist (physician) and Advanced Practice Providers (APPs -  Physician Assistants and Nurse Practitioners) who all work together to provide you with the care you need, when you need it.  We recommend signing up for the patient portal called "MyChart".  Sign up information is provided on this After Visit Summary.  MyChart is used to connect with patients for Virtual Visits (Telemedicine).  Patients are able to view lab/test results, encounter notes, upcoming appointments, etc.  Non-urgent messages can be sent to your provider as well.   To learn more about what you can do with MyChart, go to [email or web address].    Your next appointment:   November 11th @ 8:45 am  The format for your next appointment:   Virtual Visit   Provider:   You will see one of the following Advanced Practice Providers on your designated Care Team:    Kerin Ransom, [name]   Other Instructions Please give our office a call to reviewed medications over the telephone

## 2020-02-15 NOTE — Assessment & Plan Note (Signed)
Continues to smoke.

## 2020-02-16 LAB — CBC
Hematocrit: 36.8 % — ABNORMAL LOW (ref 37.5–51.0)
Hemoglobin: 12.4 g/dL — ABNORMAL LOW (ref 13.0–17.7)
MCH: 30.5 pg (ref 26.6–33.0)
MCHC: 33.7 g/dL (ref 31.5–35.7)
MCV: 91 fL (ref 79–97)
Platelets: 281 10*3/uL (ref 150–450)
RBC: 4.06 x10E6/uL — ABNORMAL LOW (ref 4.14–5.80)
RDW: 12.2 % (ref 11.6–15.4)
WBC: 10 10*3/uL (ref 3.4–10.8)

## 2020-02-16 LAB — BASIC METABOLIC PANEL
BUN/Creatinine Ratio: 21 (ref 10–24)
BUN: 26 mg/dL (ref 8–27)
CO2: 26 mmol/L (ref 20–29)
Calcium: 9.3 mg/dL (ref 8.6–10.2)
Chloride: 98 mmol/L (ref 96–106)
Creatinine, Ser: 1.23 mg/dL (ref 0.76–1.27)
GFR calc Af Amer: 69 mL/min/{1.73_m2} (ref >59)
GFR calc non Af Amer: 60 mL/min/{1.73_m2} (ref >59)
Glucose: 101 mg/dL — ABNORMAL HIGH (ref 65–99)
Potassium: 5 mmol/L (ref 3.5–5.2)
Sodium: 135 mmol/L (ref 134–144)

## 2020-02-20 ENCOUNTER — Telehealth: Payer: Self-pay | Admitting: Orthopedic Surgery

## 2020-02-20 NOTE — Telephone Encounter (Signed)
Verbal order given  

## 2020-02-20 NOTE — Telephone Encounter (Signed)
Mark with [facility] called with a verbal order for once a week for 1 week starting 02/18/20  Elta Guadeloupe CB# [phone]

## 2020-02-23 ENCOUNTER — Telehealth: Payer: Self-pay | Admitting: Cardiovascular Disease

## 2020-02-23 DIAGNOSIS — D649 Anemia, unspecified: Secondary | ICD-10-CM | POA: Diagnosis not present

## 2020-02-23 DIAGNOSIS — I4891 Unspecified atrial fibrillation: Secondary | ICD-10-CM | POA: Diagnosis not present

## 2020-02-23 DIAGNOSIS — Z4781 Encounter for orthopedic aftercare following surgical amputation: Secondary | ICD-10-CM | POA: Diagnosis not present

## 2020-02-23 DIAGNOSIS — I5021 Acute systolic (congestive) heart failure: Secondary | ICD-10-CM | POA: Diagnosis not present

## 2020-02-23 DIAGNOSIS — I484 Atypical atrial flutter: Secondary | ICD-10-CM | POA: Diagnosis not present

## 2020-02-23 DIAGNOSIS — E1140 Type 2 diabetes mellitus with diabetic neuropathy, unspecified: Secondary | ICD-10-CM | POA: Diagnosis not present

## 2020-02-23 DIAGNOSIS — I110 Hypertensive heart disease with heart failure: Secondary | ICD-10-CM | POA: Diagnosis not present

## 2020-02-23 DIAGNOSIS — E1151 Type 2 diabetes mellitus with diabetic peripheral angiopathy without gangrene: Secondary | ICD-10-CM | POA: Diagnosis not present

## 2020-02-23 DIAGNOSIS — I350 Nonrheumatic aortic (valve) stenosis: Secondary | ICD-10-CM | POA: Diagnosis not present

## 2020-02-23 NOTE — Telephone Encounter (Signed)
Patient returned call to discuss lab results.

## 2020-02-23 NOTE — Telephone Encounter (Signed)
Rodney [name]  02/22/2020 2:02 PM EDT Back to Top    Leave message for pt to call back   Erlene Quan, PA-C  02/20/2020 8:30 AM EDT     Please let the patient know I reviewed his labs- they look good- same Rx, keep f/u as scheduled.  Kerin Ransom PA-C 02/20/2020 8:30 AM    Patient & girlfriend Rodney [name] aware of results. Advised they sign DPR form next time they are in the office.

## 2020-02-24 DIAGNOSIS — R2681 Unsteadiness on feet: Secondary | ICD-10-CM | POA: Diagnosis not present

## 2020-02-24 DIAGNOSIS — Z89512 Acquired absence of left leg below knee: Secondary | ICD-10-CM | POA: Diagnosis not present

## 2020-02-24 DIAGNOSIS — M6281 Muscle weakness (generalized): Secondary | ICD-10-CM | POA: Diagnosis not present

## 2020-02-24 DIAGNOSIS — Z4781 Encounter for orthopedic aftercare following surgical amputation: Secondary | ICD-10-CM | POA: Diagnosis not present

## 2020-02-25 DIAGNOSIS — I350 Nonrheumatic aortic (valve) stenosis: Secondary | ICD-10-CM | POA: Diagnosis not present

## 2020-02-25 DIAGNOSIS — I5021 Acute systolic (congestive) heart failure: Secondary | ICD-10-CM | POA: Diagnosis not present

## 2020-02-25 DIAGNOSIS — E1151 Type 2 diabetes mellitus with diabetic peripheral angiopathy without gangrene: Secondary | ICD-10-CM | POA: Diagnosis not present

## 2020-02-25 DIAGNOSIS — I484 Atypical atrial flutter: Secondary | ICD-10-CM | POA: Diagnosis not present

## 2020-02-25 DIAGNOSIS — Z4781 Encounter for orthopedic aftercare following surgical amputation: Secondary | ICD-10-CM | POA: Diagnosis not present

## 2020-02-25 DIAGNOSIS — D649 Anemia, unspecified: Secondary | ICD-10-CM | POA: Diagnosis not present

## 2020-02-25 DIAGNOSIS — I4891 Unspecified atrial fibrillation: Secondary | ICD-10-CM | POA: Diagnosis not present

## 2020-02-25 DIAGNOSIS — E1140 Type 2 diabetes mellitus with diabetic neuropathy, unspecified: Secondary | ICD-10-CM | POA: Diagnosis not present

## 2020-02-25 DIAGNOSIS — I110 Hypertensive heart disease with heart failure: Secondary | ICD-10-CM | POA: Diagnosis not present

## 2020-02-26 ENCOUNTER — Telehealth: Payer: Self-pay | Admitting: Orthopedic Surgery

## 2020-02-26 NOTE — Telephone Encounter (Signed)
I called and sw Elta Guadeloupe advised verbal ok for extended PT orders as below.

## 2020-02-26 NOTE — Telephone Encounter (Signed)
Rodney [name] called to obtain orders   extend home pt 2x week for 4 weeks  1 time a week for 4 weeks   starting on 02/25/20  Call back [phone] okay to leave VM

## 2020-02-27 DIAGNOSIS — I350 Nonrheumatic aortic (valve) stenosis: Secondary | ICD-10-CM | POA: Diagnosis not present

## 2020-02-27 DIAGNOSIS — E1151 Type 2 diabetes mellitus with diabetic peripheral angiopathy without gangrene: Secondary | ICD-10-CM | POA: Diagnosis not present

## 2020-02-27 DIAGNOSIS — D649 Anemia, unspecified: Secondary | ICD-10-CM | POA: Diagnosis not present

## 2020-02-27 DIAGNOSIS — I110 Hypertensive heart disease with heart failure: Secondary | ICD-10-CM | POA: Diagnosis not present

## 2020-02-27 DIAGNOSIS — I4891 Unspecified atrial fibrillation: Secondary | ICD-10-CM | POA: Diagnosis not present

## 2020-02-27 DIAGNOSIS — E1140 Type 2 diabetes mellitus with diabetic neuropathy, unspecified: Secondary | ICD-10-CM | POA: Diagnosis not present

## 2020-02-27 DIAGNOSIS — I484 Atypical atrial flutter: Secondary | ICD-10-CM | POA: Diagnosis not present

## 2020-02-27 DIAGNOSIS — I5021 Acute systolic (congestive) heart failure: Secondary | ICD-10-CM | POA: Diagnosis not present

## 2020-02-27 DIAGNOSIS — Z4781 Encounter for orthopedic aftercare following surgical amputation: Secondary | ICD-10-CM | POA: Diagnosis not present

## 2020-03-05 DIAGNOSIS — D649 Anemia, unspecified: Secondary | ICD-10-CM | POA: Diagnosis not present

## 2020-03-05 DIAGNOSIS — I350 Nonrheumatic aortic (valve) stenosis: Secondary | ICD-10-CM | POA: Diagnosis not present

## 2020-03-05 DIAGNOSIS — I484 Atypical atrial flutter: Secondary | ICD-10-CM | POA: Diagnosis not present

## 2020-03-05 DIAGNOSIS — E1151 Type 2 diabetes mellitus with diabetic peripheral angiopathy without gangrene: Secondary | ICD-10-CM | POA: Diagnosis not present

## 2020-03-05 DIAGNOSIS — E1140 Type 2 diabetes mellitus with diabetic neuropathy, unspecified: Secondary | ICD-10-CM | POA: Diagnosis not present

## 2020-03-05 DIAGNOSIS — I110 Hypertensive heart disease with heart failure: Secondary | ICD-10-CM | POA: Diagnosis not present

## 2020-03-05 DIAGNOSIS — I5021 Acute systolic (congestive) heart failure: Secondary | ICD-10-CM | POA: Diagnosis not present

## 2020-03-05 DIAGNOSIS — Z4781 Encounter for orthopedic aftercare following surgical amputation: Secondary | ICD-10-CM | POA: Diagnosis not present

## 2020-03-05 DIAGNOSIS — I4891 Unspecified atrial fibrillation: Secondary | ICD-10-CM | POA: Diagnosis not present

## 2020-03-07 ENCOUNTER — Telehealth: Payer: Medicare PPO | Admitting: Cardiology

## 2020-03-08 ENCOUNTER — Other Ambulatory Visit: Payer: Self-pay

## 2020-03-08 ENCOUNTER — Ambulatory Visit (INDEPENDENT_AMBULATORY_CARE_PROVIDER_SITE_OTHER): Payer: Medicare PPO

## 2020-03-08 DIAGNOSIS — I4819 Other persistent atrial fibrillation: Secondary | ICD-10-CM

## 2020-03-08 DIAGNOSIS — Z7901 Long term (current) use of anticoagulants: Secondary | ICD-10-CM

## 2020-03-08 LAB — POCT INR: INR: 2.8 (ref 2.0–3.0)

## 2020-03-08 NOTE — Patient Instructions (Signed)
Continue taking 1 tablet daily except Wednesday take 1.5 tablets.  INR check in 6 weeks

## 2020-03-12 DIAGNOSIS — I5021 Acute systolic (congestive) heart failure: Secondary | ICD-10-CM | POA: Diagnosis not present

## 2020-03-12 DIAGNOSIS — I4891 Unspecified atrial fibrillation: Secondary | ICD-10-CM | POA: Diagnosis not present

## 2020-03-12 DIAGNOSIS — Z4781 Encounter for orthopedic aftercare following surgical amputation: Secondary | ICD-10-CM | POA: Diagnosis not present

## 2020-03-12 DIAGNOSIS — D649 Anemia, unspecified: Secondary | ICD-10-CM | POA: Diagnosis not present

## 2020-03-12 DIAGNOSIS — I350 Nonrheumatic aortic (valve) stenosis: Secondary | ICD-10-CM | POA: Diagnosis not present

## 2020-03-12 DIAGNOSIS — I110 Hypertensive heart disease with heart failure: Secondary | ICD-10-CM | POA: Diagnosis not present

## 2020-03-12 DIAGNOSIS — E1140 Type 2 diabetes mellitus with diabetic neuropathy, unspecified: Secondary | ICD-10-CM | POA: Diagnosis not present

## 2020-03-12 DIAGNOSIS — E1151 Type 2 diabetes mellitus with diabetic peripheral angiopathy without gangrene: Secondary | ICD-10-CM | POA: Diagnosis not present

## 2020-03-12 DIAGNOSIS — I484 Atypical atrial flutter: Secondary | ICD-10-CM | POA: Diagnosis not present

## 2020-03-14 DIAGNOSIS — I4891 Unspecified atrial fibrillation: Secondary | ICD-10-CM | POA: Diagnosis not present

## 2020-03-14 DIAGNOSIS — D649 Anemia, unspecified: Secondary | ICD-10-CM | POA: Diagnosis not present

## 2020-03-14 DIAGNOSIS — I484 Atypical atrial flutter: Secondary | ICD-10-CM | POA: Diagnosis not present

## 2020-03-14 DIAGNOSIS — E1151 Type 2 diabetes mellitus with diabetic peripheral angiopathy without gangrene: Secondary | ICD-10-CM | POA: Diagnosis not present

## 2020-03-14 DIAGNOSIS — Z4781 Encounter for orthopedic aftercare following surgical amputation: Secondary | ICD-10-CM | POA: Diagnosis not present

## 2020-03-14 DIAGNOSIS — E1140 Type 2 diabetes mellitus with diabetic neuropathy, unspecified: Secondary | ICD-10-CM | POA: Diagnosis not present

## 2020-03-14 DIAGNOSIS — I350 Nonrheumatic aortic (valve) stenosis: Secondary | ICD-10-CM | POA: Diagnosis not present

## 2020-03-14 DIAGNOSIS — I110 Hypertensive heart disease with heart failure: Secondary | ICD-10-CM | POA: Diagnosis not present

## 2020-03-14 DIAGNOSIS — I5021 Acute systolic (congestive) heart failure: Secondary | ICD-10-CM | POA: Diagnosis not present

## 2020-03-14 NOTE — Progress Notes (Signed)
Virtual Visit via Telephone Note   This visit type was conducted due to national recommendations for restrictions regarding the COVID-19 Pandemic (e.g. social distancing) in an effort to limit this patient's exposure and mitigate transmission in our community.  Due to his co-morbid illnesses, this patient is at least at moderate risk for complications without adequate follow up.  This format is felt to be most appropriate for this patient at this time.  The patient did not have access to video technology/had technical difficulties with video requiring transitioning to audio format only (telephone).  All issues noted in this document were discussed and addressed.  No physical exam could be performed with this format.  Please refer to the patient's chart for his  consent to telehealth for [facility].  Evaluation Performed:  Follow-up visit  This visit type was conducted due to national recommendations for restrictions regarding the COVID-19 Pandemic (e.g. social distancing).  This format is felt to be most appropriate for this patient at this time.  All issues noted in this document were discussed and addressed.  No physical exam was performed (except for noted visual exam findings with Video Visits).  Please refer to the patient's chart (MyChart message for video visits and phone note for telephone visits) for the patient's consent to telehealth for [place]  Date:  03/18/2020   ID:  Rodney [name], DOB 11-Sep-1950, MRN [id number]  Patient Location:  [place], [place] [address]   Provider location:     [address] Office [phone] Fax [phone]   PCP:  [name], MD  Cardiologist:  Evalina Field, MD  Electrophysiologist:  None   Chief Complaint: Coronary artery disease and atrial fibrillation  History of Present Illness:    Rodney [name] is a 69 y.o. male who  presents via audio/video conferencing for a telehealth visit today.  Patient verified DOB and address.  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  He developed paroxysmal atrial fibrillation postoperatively.  Echocardiogram showed cardiomyopathy with an ejection fraction of 35-40%.  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  He underwent TEE DCCV on 10/05/2019.  He was discharged on Eliquis but could not afford the medication.  He was seen as an outpatient 7/21.  He was back in atrial fibrillation and not on anticoagulation.  His amiodarone was stopped at that time.  There was a plan made to restart his anticoagulation and attempt another DCCV.  He was last seen by Rodney Ransom, PA-C on 02/15/2020.  He had been placed on warfarin and was being followed anticoagulation clinic.  He reported compliance with his medications.  He presented to talk about repeat DCCV.  However, he was noted to be in normal sinus rhythm.  He did not bring his medications with him.  His medication list indicated he was on amiodarone even though it has been discontinued.  He denied shortness of breath, palpitations.(!) 112/52His blood pressure was noted to be 90/50.  His wife contacted nurse triage line on 02/15/2020 and indicated that she had  reviewed  the medication list that was sent home.  She reported that it was up-to-date.  He is seen virtually today in follow-up and states he feels well.  He has been working with physical therapy 2 times a week for about 30-45 minutes.  He is also been walking  with his wife and his prosthesis.  He states that occasionally he has trouble with his fit when he does not keep his stump compression on.  He has been avoiding extra salt in his diet and his wife verifies that they have been cooking most of their meals at home.  He does not notice any irregular or extra heartbeats.  However, he was cardiac unaware previously.  I will give him a salty 6 diet sheet, have him continue to increase his physical activity as tolerated, and follow-up in 3 months.  Today he denies chest pain, shortness of breath, lower extremity edema, fatigue, palpitations, melena, hematuria, hemoptysis, diaphoresis, weakness, presyncope, syncope, orthopnea, and PND.     The patient does not symptoms concerning for COVID-19 infection (fever, chills, cough, or new SHORTNESS OF BREATH).    Prior CV studies:   The following studies were reviewed today:  EKG 02/15/2020  normal sinus rhythm no ST or T wave deviation 68 bpm.  Echocardiogram 10/25/2019 IMPRESSIONS    1. Assessment of his LV function is difficult. Consider repeat echo with  definity contrast once HR is slower . Marland Kitchen Left ventricular ejection  fraction, by estimation, is 35 to 40%. The left ventricle has moderately  decreased function. The left ventricle  demonstrates global hypokinesis. Left ventricular diastolic parameters are  indeterminate.  2. Right ventricular systolic function is mildly reduced. The right  ventricular size is normal.  3. Left atrial size was mildly dilated.  4. Right atrial size was mildly dilated.  5. The mitral valve is normal in structure. Mild mitral valve  regurgitation. No evidence of mitral stenosis.  6. The aortic valve is not well visualized in the short axis view but is  likely a 3 leaflet valve .Marland Kitchen The aortic valve is tricuspid. Aortic valve  regurgitation is not visualized. Moderate aortic valve stenosis.   Past Medical History:  Diagnosis Date   Diabetes mellitus without  complication ([place])    HLD (hyperlipidemia)    Hypertension    Past Surgical History:  Procedure Laterality Date   AMPUTATION Left 09/28/2019   Procedure: AMPUTATION BELOW KNEE;  Surgeon: Rodney Minion, MD;  Location: [place];  Service: Orthopedics;  Laterality: Left;   CARDIOVERSION N/A 10/05/2019   Procedure: CARDIOVERSION;  Surgeon: Sanda Klein, MD;  Location: [place];  Service: Cardiovascular;  Laterality: N/A;   LEFT HEART CATH AND CORONARY ANGIOGRAPHY N/A 10/03/2019   Procedure: LEFT HEART CATH AND CORONARY ANGIOGRAPHY;  Surgeon: Lorretta Harp, MD;  Location: [place] [facility];  Service: Cardiovascular;  Laterality: N/A;   TEE WITHOUT CARDIOVERSION N/A 10/05/2019   Procedure: TRANSESOPHAGEAL ECHOCARDIOGRAM (TEE);  Surgeon: Sanda Klein, MD;  Location: [facility];  Service: Cardiovascular;  Laterality: N/A;     Current Meds  Medication Sig   amiodarone (PACERONE) 200 MG tablet Take 1 tablet (200 mg total) by mouth daily.   aspirin EC 81 MG tablet Take 1 tablet (81 mg total) by mouth daily. Swallow whole.   atorvastatin (LIPITOR) 80 MG tablet Take 1 tablet (80 mg total) by mouth daily.   Blood Glucose Monitoring Suppl (TRUE METRIX METER) DEVI Use to test blood sugar three times daily.  Blood Glucose Monitoring Suppl (TRUE METRIX METER) w/Device KIT USE AS DIRECTED   carvedilol (COREG) 12.5 MG tablet Take 1 tablet (12.5 mg total) by mouth 2 (two) times daily with a meal.   furosemide (LASIX) 20 MG tablet Take 1 tablet (20 mg total) by mouth daily.   glucose blood (RELION TRUE METRIX TEST STRIPS) test strip Use to test blood sugar three times per day.   insulin aspart protamine- aspart (NOVOLOG MIX 70/30) (70-30) 100 UNIT/ML injection Inject into the skin. 30 units q breakfast and 15 units q supper.  Only use until we can get you lantus   losartan (COZAAR) 25 MG tablet Take 1 tablet (25 mg total) by mouth daily. Duplicate.  Losartan replaces lisinopril.   Please discontinue the lisinopril   metFORMIN (GLUCOPHAGE) 1000 MG tablet TAKE 1 TABLET TWICE DAILY WITH A MEAL   spironolactone (ALDACTONE) 25 MG tablet Take 12.5 mg by mouth daily.   TRUEplus Lancets 33G MISC Use to test blood sugar three times per day.   warfarin (COUMADIN) 5 MG tablet Take 1 tablet (5 mg total) by mouth daily.     Allergies:   Patient has no known allergies.   Social History   Tobacco Use   Smoking status: Current Every Day Smoker    Packs/day: 0.50    Types: Cigarettes   Smokeless tobacco: Never Used  Substance Use Topics   Alcohol use: Yes    Alcohol/week: 6.0 standard drinks    Types: 6 Standard drinks or equivalent per week   Drug use: No     Family Hx: The patient's family history includes Alcoholism in his father and mother.  ROS:   Please see the history of present illness.     All other systems reviewed and are negative.   Labs/Other Tests and Data Reviewed:    Recent Labs: 09/26/2019: ALT 71 09/27/2019: B Natriuretic Peptide 195.1; TSH 1.215 02/15/2020: BUN 26; Creatinine, Ser 1.23; Hemoglobin 12.4; Platelets 281; Potassium 5.0; Sodium 135   Recent Lipid Panel Lab Results  Component Value Date/Time   CHOL 91 09/26/2019 05:36 PM   CHOL 140 02/21/2018 04:50 PM   TRIG 113 09/26/2019 05:36 PM   HDL 20 (L) 09/26/2019 05:36 PM   HDL 33 (L) 02/21/2018 04:50 PM   CHOLHDL 4.6 09/26/2019 05:36 PM   LDLCALC 48 09/26/2019 05:36 PM   LDLCALC 79 02/21/2018 04:50 PM   LDLDIRECT 92 01/03/2014 03:14 PM    Wt Readings from Last 3 Encounters:  02/15/20 250 lb (113.4 kg)  12/13/19 227 lb (103 kg)  11/13/19 227 lb 9.6 oz (103.2 kg)     Exam:    Vital Signs:  BP (!) 112/52    Pulse 61    Well nourished, well developed male in no  acute distress.   ASSESSMENT & PLAN:    1.  Persistent atrial fibrillation-heart rate today 61 BPM.  During last office visit 02/15/2020 patient had converted to sinus rhythm.  His amiodarone was  continued. Continue amiodarone, aspirin, warfarin, spironolactone, losartan, carvedilol Heart healthy low-sodium diet-salty 6 given Increase physical activity as tolerated Avoid triggers caffeine, chocolate, EtOH etc.  Essential hypertension-BP today 112/52.  Well-controlled at home.  No reports of dizziness, presyncope or syncope. Continue losartan, carvedilol, spironolactone, furosemide Heart healthy low-sodium diet-salty 6 given Increase physical activity as tolerated Continue to monitor  Hyperlipidemia-09/26/2019: Cholesterol 91; HDL 20; LDL Cholesterol 48; Triglycerides 113; VLDL 23 Continue atorvastatin Heart healthy low-sodium high-fiber diet Increase physical activity as tolerated  Coronary artery disease-no chest pain today.  Chronic total occlusion RCA by cath 6/21.  No other significant coronary artery disease. Continue atorvastatin, aspirin, carvedilol, spironolactone Heart healthy low-sodium diet-salty 6 given Increase physical activity as tolerated  Cardiomyopathy-no increased activity intolerance or DOE.  He is working with physical therapy 2 times per week for 30-45 minutes and also doing physical therapy exercises on his own.  EF 35-40% while in atrial fibrillation 6/21. Continue carvedilol, spironolactone, furosemide, losartan Heart healthy low-sodium diet-salty 6 given Increase physical activity as tolerated  Disposition: Follow-up with Dr. Audie Box in 3 months.   COVID-19 Education: The signs and symptoms of COVID-19 were discussed with the patient and how to seek care for testing (follow up with PCP or arrange E-visit).  The importance of social distancing was discussed today.  Patient Risk:   After full review of this patients clinical status, I feel that they are at least moderate risk at this time.  Time:   Today, I have spent 12 minutes with the patient with telehealth technology discussing diet exercise medication, and atrial fibrillation.  I spent greater  than 20 minutes reviewing patient's previous cardiac notes, cardiac exams, and cardiac medications.   Medication Adjustments/Labs and Tests Ordered: Current medicines are reviewed at length with the patient today.  Concerns regarding medicines are outlined above.   Tests Ordered: No orders of the defined types were placed in this encounter.  Medication Changes: No orders of the defined types were placed in this encounter.   Disposition:  in 3 month(s)  Signed, Jossie Ng. Alaysia Lightle NP-C    11/29/2018 11:58 AM    [address] Office [phone] Fax [phone]

## 2020-03-18 ENCOUNTER — Telehealth (INDEPENDENT_AMBULATORY_CARE_PROVIDER_SITE_OTHER): Payer: Medicare PPO | Admitting: General Practice

## 2020-03-18 ENCOUNTER — Encounter: Payer: Self-pay | Admitting: General Practice

## 2020-03-18 VITALS — BP 112/52 | HR 61

## 2020-03-18 DIAGNOSIS — I428 Other cardiomyopathies: Secondary | ICD-10-CM

## 2020-03-18 DIAGNOSIS — I4819 Other persistent atrial fibrillation: Secondary | ICD-10-CM | POA: Diagnosis not present

## 2020-03-18 DIAGNOSIS — E7800 Pure hypercholesterolemia, unspecified: Secondary | ICD-10-CM | POA: Diagnosis not present

## 2020-03-18 DIAGNOSIS — I10 Essential (primary) hypertension: Secondary | ICD-10-CM

## 2020-03-18 DIAGNOSIS — I2510 Atherosclerotic heart disease of native coronary artery without angina pectoris: Secondary | ICD-10-CM | POA: Diagnosis not present

## 2020-03-18 NOTE — Patient Instructions (Addendum)
Medication Instructions:  The current medical regimen is effective;  continue present plan and medications as directed. Please refer to the Current Medication list given to you today.  *If you need a refill on your cardiac medications before your next appointment, please call your pharmacy*  Lab Work:   Testing/Procedures:  NONE    NONE  Special Instructions Please try to avoid these triggers:  Do not use any products that have nicotine or tobacco in them. These include cigarettes, e-cigarettes, and chewing tobacco. If you need help quitting, ask your doctor.  Eat heart-healthy foods. Talk with your doctor about the right eating plan for you.  Exercise regularly as told by your doctor.  Do not drink alcohol, Caffeine or chocolate.  Lose weight if you are overweight.  Do not use drugs, including cannabis   PLEASE READ AND FOLLOW SALTY 6-ATTACHED-1,800mg  daily  PLEASE INCREASE PHYSICAL ACTIVITY AS TOLERATED  Follow-Up: Your next appointment:  3 month(s) In Person with Eleonore Chiquito, MD OR IF UNAVAILABLE [place], FNP-C   At [facility], you and your health needs are our priority.  As part of our continuing mission to provide you with exceptional heart care, we have created designated Provider Care Teams.  These Care Teams include your primary Cardiologist (physician) and Advanced Practice Providers (APPs -  Physician Assistants and Nurse Practitioners) who all work together to provide you with the care you need, when you need it.  We recommend signing up for the patient portal called "MyChart".  Sign up information is provided on this After Visit Summary.  MyChart is used to connect with patients for Virtual Visits (Telemedicine).  Patients are able to view lab/test results, encounter notes, upcoming appointments, etc.  Non-urgent messages can be sent to your provider as well.   To learn more about what you can do with MyChart, go to [email or web address].              6  SALTY THINGS TO AVOID     1,800MG  DAILY

## 2020-03-19 DIAGNOSIS — I110 Hypertensive heart disease with heart failure: Secondary | ICD-10-CM | POA: Diagnosis not present

## 2020-03-19 DIAGNOSIS — I4891 Unspecified atrial fibrillation: Secondary | ICD-10-CM | POA: Diagnosis not present

## 2020-03-19 DIAGNOSIS — I484 Atypical atrial flutter: Secondary | ICD-10-CM | POA: Diagnosis not present

## 2020-03-19 DIAGNOSIS — E1140 Type 2 diabetes mellitus with diabetic neuropathy, unspecified: Secondary | ICD-10-CM | POA: Diagnosis not present

## 2020-03-19 DIAGNOSIS — I5021 Acute systolic (congestive) heart failure: Secondary | ICD-10-CM | POA: Diagnosis not present

## 2020-03-19 DIAGNOSIS — Z4781 Encounter for orthopedic aftercare following surgical amputation: Secondary | ICD-10-CM | POA: Diagnosis not present

## 2020-03-19 DIAGNOSIS — E1151 Type 2 diabetes mellitus with diabetic peripheral angiopathy without gangrene: Secondary | ICD-10-CM | POA: Diagnosis not present

## 2020-03-19 DIAGNOSIS — D649 Anemia, unspecified: Secondary | ICD-10-CM | POA: Diagnosis not present

## 2020-03-19 DIAGNOSIS — I350 Nonrheumatic aortic (valve) stenosis: Secondary | ICD-10-CM | POA: Diagnosis not present

## 2020-03-22 ENCOUNTER — Other Ambulatory Visit: Payer: Self-pay | Admitting: Cardiovascular Disease

## 2020-03-22 DIAGNOSIS — I350 Nonrheumatic aortic (valve) stenosis: Secondary | ICD-10-CM | POA: Diagnosis not present

## 2020-03-22 DIAGNOSIS — Z4781 Encounter for orthopedic aftercare following surgical amputation: Secondary | ICD-10-CM | POA: Diagnosis not present

## 2020-03-22 DIAGNOSIS — I5021 Acute systolic (congestive) heart failure: Secondary | ICD-10-CM | POA: Diagnosis not present

## 2020-03-22 DIAGNOSIS — I110 Hypertensive heart disease with heart failure: Secondary | ICD-10-CM | POA: Diagnosis not present

## 2020-03-22 DIAGNOSIS — E1140 Type 2 diabetes mellitus with diabetic neuropathy, unspecified: Secondary | ICD-10-CM | POA: Diagnosis not present

## 2020-03-22 DIAGNOSIS — E1151 Type 2 diabetes mellitus with diabetic peripheral angiopathy without gangrene: Secondary | ICD-10-CM | POA: Diagnosis not present

## 2020-03-22 DIAGNOSIS — D649 Anemia, unspecified: Secondary | ICD-10-CM | POA: Diagnosis not present

## 2020-03-22 DIAGNOSIS — I484 Atypical atrial flutter: Secondary | ICD-10-CM | POA: Diagnosis not present

## 2020-03-22 DIAGNOSIS — I4891 Unspecified atrial fibrillation: Secondary | ICD-10-CM | POA: Diagnosis not present

## 2020-03-25 ENCOUNTER — Other Ambulatory Visit: Payer: Self-pay | Admitting: Family Medicine

## 2020-03-25 NOTE — Telephone Encounter (Signed)
Rx has been sent to the pharmacy electronically. ° °

## 2020-03-26 DIAGNOSIS — M6281 Muscle weakness (generalized): Secondary | ICD-10-CM | POA: Diagnosis not present

## 2020-03-26 DIAGNOSIS — I4891 Unspecified atrial fibrillation: Secondary | ICD-10-CM | POA: Diagnosis not present

## 2020-03-26 DIAGNOSIS — Z89512 Acquired absence of left leg below knee: Secondary | ICD-10-CM | POA: Diagnosis not present

## 2020-03-26 DIAGNOSIS — I350 Nonrheumatic aortic (valve) stenosis: Secondary | ICD-10-CM | POA: Diagnosis not present

## 2020-03-26 DIAGNOSIS — R2681 Unsteadiness on feet: Secondary | ICD-10-CM | POA: Diagnosis not present

## 2020-03-26 DIAGNOSIS — D649 Anemia, unspecified: Secondary | ICD-10-CM | POA: Diagnosis not present

## 2020-03-26 DIAGNOSIS — I5021 Acute systolic (congestive) heart failure: Secondary | ICD-10-CM | POA: Diagnosis not present

## 2020-03-26 DIAGNOSIS — I110 Hypertensive heart disease with heart failure: Secondary | ICD-10-CM | POA: Diagnosis not present

## 2020-03-26 DIAGNOSIS — Z4781 Encounter for orthopedic aftercare following surgical amputation: Secondary | ICD-10-CM | POA: Diagnosis not present

## 2020-03-26 DIAGNOSIS — E1140 Type 2 diabetes mellitus with diabetic neuropathy, unspecified: Secondary | ICD-10-CM | POA: Diagnosis not present

## 2020-03-26 DIAGNOSIS — I484 Atypical atrial flutter: Secondary | ICD-10-CM | POA: Diagnosis not present

## 2020-03-26 DIAGNOSIS — E1151 Type 2 diabetes mellitus with diabetic peripheral angiopathy without gangrene: Secondary | ICD-10-CM | POA: Diagnosis not present

## 2020-04-03 ENCOUNTER — Other Ambulatory Visit (HOSPITAL_COMMUNITY): Payer: Medicare PPO

## 2020-04-03 ENCOUNTER — Inpatient Hospital Stay (HOSPITAL_COMMUNITY)
Admission: EM | Admit: 2020-04-03 | Discharge: 2020-04-09 | DRG: 683 | Disposition: A | Discharge status: Home / Self Care | Payer: Medicare PPO | Attending: Family Medicine | Admitting: Family Medicine

## 2020-04-03 ENCOUNTER — Encounter (HOSPITAL_COMMUNITY): Payer: Self-pay

## 2020-04-03 ENCOUNTER — Inpatient Hospital Stay (HOSPITAL_COMMUNITY): Payer: Medicare PPO

## 2020-04-03 ENCOUNTER — Emergency Department (HOSPITAL_COMMUNITY): Payer: Medicare PPO

## 2020-04-03 ENCOUNTER — Other Ambulatory Visit: Payer: Self-pay

## 2020-04-03 DIAGNOSIS — I5022 Chronic systolic (congestive) heart failure: Secondary | ICD-10-CM | POA: Diagnosis present

## 2020-04-03 DIAGNOSIS — R531 Weakness: Secondary | ICD-10-CM | POA: Diagnosis not present

## 2020-04-03 DIAGNOSIS — N1831 Chronic kidney disease, stage 3a: Secondary | ICD-10-CM | POA: Diagnosis present

## 2020-04-03 DIAGNOSIS — R29810 Facial weakness: Secondary | ICD-10-CM | POA: Diagnosis not present

## 2020-04-03 DIAGNOSIS — I350 Nonrheumatic aortic (valve) stenosis: Secondary | ICD-10-CM | POA: Diagnosis present

## 2020-04-03 DIAGNOSIS — I130 Hypertensive heart and chronic kidney disease with heart failure and stage 1 through stage 4 chronic kidney disease, or unspecified chronic kidney disease: Secondary | ICD-10-CM | POA: Diagnosis present

## 2020-04-03 DIAGNOSIS — E1151 Type 2 diabetes mellitus with diabetic peripheral angiopathy without gangrene: Secondary | ICD-10-CM | POA: Diagnosis present

## 2020-04-03 DIAGNOSIS — Z992 Dependence on renal dialysis: Secondary | ICD-10-CM

## 2020-04-03 DIAGNOSIS — J9811 Atelectasis: Secondary | ICD-10-CM | POA: Diagnosis not present

## 2020-04-03 DIAGNOSIS — E1122 Type 2 diabetes mellitus with diabetic chronic kidney disease: Secondary | ICD-10-CM | POA: Diagnosis present

## 2020-04-03 DIAGNOSIS — E785 Hyperlipidemia, unspecified: Secondary | ICD-10-CM | POA: Diagnosis present

## 2020-04-03 DIAGNOSIS — R0689 Other abnormalities of breathing: Secondary | ICD-10-CM | POA: Diagnosis not present

## 2020-04-03 DIAGNOSIS — Z89512 Acquired absence of left leg below knee: Secondary | ICD-10-CM | POA: Diagnosis not present

## 2020-04-03 DIAGNOSIS — IMO0002 Reserved for concepts with insufficient information to code with codable children: Secondary | ICD-10-CM

## 2020-04-03 DIAGNOSIS — E875 Hyperkalemia: Secondary | ICD-10-CM | POA: Diagnosis present

## 2020-04-03 DIAGNOSIS — F17210 Nicotine dependence, cigarettes, uncomplicated: Secondary | ICD-10-CM | POA: Diagnosis present

## 2020-04-03 DIAGNOSIS — I2510 Atherosclerotic heart disease of native coronary artery without angina pectoris: Secondary | ICD-10-CM | POA: Diagnosis not present

## 2020-04-03 DIAGNOSIS — E1165 Type 2 diabetes mellitus with hyperglycemia: Secondary | ICD-10-CM | POA: Diagnosis present

## 2020-04-03 DIAGNOSIS — R0602 Shortness of breath: Secondary | ICD-10-CM | POA: Diagnosis not present

## 2020-04-03 DIAGNOSIS — T501X5A Adverse effect of loop [high-ceiling] diuretics, initial encounter: Secondary | ICD-10-CM | POA: Diagnosis present

## 2020-04-03 DIAGNOSIS — Z6834 Body mass index (BMI) 34.0-34.9, adult: Secondary | ICD-10-CM

## 2020-04-03 DIAGNOSIS — R231 Pallor: Secondary | ICD-10-CM | POA: Diagnosis not present

## 2020-04-03 DIAGNOSIS — R112 Nausea with vomiting, unspecified: Secondary | ICD-10-CM | POA: Diagnosis not present

## 2020-04-03 DIAGNOSIS — N179 Acute kidney failure, unspecified: Secondary | ICD-10-CM | POA: Diagnosis not present

## 2020-04-03 DIAGNOSIS — I429 Cardiomyopathy, unspecified: Secondary | ICD-10-CM | POA: Diagnosis not present

## 2020-04-03 DIAGNOSIS — I482 Chronic atrial fibrillation, unspecified: Secondary | ICD-10-CM | POA: Diagnosis present

## 2020-04-03 DIAGNOSIS — Z794 Long term (current) use of insulin: Secondary | ICD-10-CM

## 2020-04-03 DIAGNOSIS — Z20822 Contact with and (suspected) exposure to covid-19: Secondary | ICD-10-CM | POA: Diagnosis not present

## 2020-04-03 DIAGNOSIS — D638 Anemia in other chronic diseases classified elsewhere: Secondary | ICD-10-CM | POA: Diagnosis present

## 2020-04-03 DIAGNOSIS — Z452 Encounter for adjustment and management of vascular access device: Secondary | ICD-10-CM | POA: Diagnosis not present

## 2020-04-03 DIAGNOSIS — I5020 Unspecified systolic (congestive) heart failure: Secondary | ICD-10-CM | POA: Diagnosis not present

## 2020-04-03 DIAGNOSIS — R001 Bradycardia, unspecified: Secondary | ICD-10-CM | POA: Diagnosis present

## 2020-04-03 DIAGNOSIS — R0600 Dyspnea, unspecified: Secondary | ICD-10-CM | POA: Diagnosis not present

## 2020-04-03 DIAGNOSIS — N19 Unspecified kidney failure: Secondary | ICD-10-CM

## 2020-04-03 DIAGNOSIS — E871 Hypo-osmolality and hyponatremia: Secondary | ICD-10-CM | POA: Diagnosis not present

## 2020-04-03 DIAGNOSIS — Z7901 Long term (current) use of anticoagulants: Secondary | ICD-10-CM | POA: Diagnosis not present

## 2020-04-03 DIAGNOSIS — N183 Chronic kidney disease, stage 3 unspecified: Secondary | ICD-10-CM | POA: Diagnosis not present

## 2020-04-03 DIAGNOSIS — R0902 Hypoxemia: Secondary | ICD-10-CM | POA: Diagnosis not present

## 2020-04-03 LAB — GLUCOSE, CAPILLARY
Glucose-Capillary: 124 mg/dL — ABNORMAL HIGH (ref 70–99)
Glucose-Capillary: 139 mg/dL — ABNORMAL HIGH (ref 70–99)
Glucose-Capillary: 148 mg/dL — ABNORMAL HIGH (ref 70–99)
Glucose-Capillary: 178 mg/dL — ABNORMAL HIGH (ref 70–99)

## 2020-04-03 LAB — CBC
HCT: 32.6 % — ABNORMAL LOW (ref 39.0–52.0)
Hemoglobin: 10.3 g/dL — ABNORMAL LOW (ref 13.0–17.0)
MCH: 30.6 pg (ref 26.0–34.0)
MCHC: 31.6 g/dL (ref 30.0–36.0)
MCV: 96.7 fL (ref 80.0–100.0)
Platelets: 268 10*3/uL (ref 150–400)
RBC: 3.37 MIL/uL — ABNORMAL LOW (ref 4.22–5.81)
RDW: 13.6 % (ref 11.5–15.5)
WBC: 11.3 10*3/uL — ABNORMAL HIGH (ref 4.0–10.5)
nRBC: 0 % (ref 0.0–0.2)

## 2020-04-03 LAB — CBC WITH DIFFERENTIAL/PLATELET
Abs Immature Granulocytes: 0.1 10*3/uL — ABNORMAL HIGH (ref 0.00–0.07)
Basophils Absolute: 0.1 10*3/uL (ref 0.0–0.1)
Basophils Relative: 0 %
Eosinophils Absolute: 0 10*3/uL (ref 0.0–0.5)
Eosinophils Relative: 0 %
HCT: 33.4 % — ABNORMAL LOW (ref 39.0–52.0)
Hemoglobin: 10.9 g/dL — ABNORMAL LOW (ref 13.0–17.0)
Immature Granulocytes: 1 %
Lymphocytes Relative: 9 %
Lymphs Abs: 1.3 10*3/uL (ref 0.7–4.0)
MCH: 31 pg (ref 26.0–34.0)
MCHC: 32.6 g/dL (ref 30.0–36.0)
MCV: 94.9 fL (ref 80.0–100.0)
Monocytes Absolute: 0.3 10*3/uL (ref 0.1–1.0)
Monocytes Relative: 2 %
Neutro Abs: 12.1 10*3/uL — ABNORMAL HIGH (ref 1.7–7.7)
Neutrophils Relative %: 88 %
Platelets: 322 10*3/uL (ref 150–400)
RBC: 3.52 MIL/uL — ABNORMAL LOW (ref 4.22–5.81)
RDW: 13.5 % (ref 11.5–15.5)
WBC: 13.8 10*3/uL — ABNORMAL HIGH (ref 4.0–10.5)
nRBC: 0 % (ref 0.0–0.2)

## 2020-04-03 LAB — URINALYSIS, ROUTINE W REFLEX MICROSCOPIC
Bacteria, UA: NONE SEEN
Bilirubin Urine: NEGATIVE
Glucose, UA: 50 mg/dL — AB
Ketones, ur: NEGATIVE mg/dL
Leukocytes,Ua: NEGATIVE
Nitrite: NEGATIVE
Protein, ur: 100 mg/dL — AB
Specific Gravity, Urine: 1.009 (ref 1.005–1.030)
pH: 7 (ref 5.0–8.0)

## 2020-04-03 LAB — I-STAT ARTERIAL BLOOD GAS, ED
Acid-base deficit: 10 mmol/L — ABNORMAL HIGH (ref 0.0–2.0)
Bicarbonate: 16.5 mmol/L — ABNORMAL LOW (ref 20.0–28.0)
Calcium, Ion: 1.12 mmol/L — ABNORMAL LOW (ref 1.15–1.40)
HCT: 28 % — ABNORMAL LOW (ref 39.0–52.0)
Hemoglobin: 9.5 g/dL — ABNORMAL LOW (ref 13.0–17.0)
O2 Saturation: 94 %
Patient temperature: 97.8
Potassium: 6.7 mmol/L (ref 3.5–5.1)
Sodium: 132 mmol/L — ABNORMAL LOW (ref 135–145)
TCO2: 18 mmol/L — ABNORMAL LOW (ref 22–32)
pCO2 arterial: 37.6 mmHg (ref 32.0–48.0)
pH, Arterial: 7.248 — ABNORMAL LOW (ref 7.350–7.450)
pO2, Arterial: 81 mmHg — ABNORMAL LOW (ref 83.0–108.0)

## 2020-04-03 LAB — PROTIME-INR
INR: 1.9 — ABNORMAL HIGH (ref 0.8–1.2)
INR: 2 — ABNORMAL HIGH (ref 0.8–1.2)
Prothrombin Time: 21.5 seconds — ABNORMAL HIGH (ref 11.4–15.2)
Prothrombin Time: 21.8 seconds — ABNORMAL HIGH (ref 11.4–15.2)

## 2020-04-03 LAB — LACTIC ACID, PLASMA
Lactic Acid, Venous: 8.3 mmol/L (ref 0.5–1.9)
Lactic Acid, Venous: 8.5 mmol/L (ref 0.5–1.9)
Lactic Acid, Venous: 5 mmol/L (ref 0.5–1.9)
Lactic Acid, Venous: 5.8 mmol/L (ref 0.5–1.9)

## 2020-04-03 LAB — RESP PANEL BY RT-PCR (FLU A&B, COVID) ARPGX2
Influenza A by PCR: NEGATIVE
Influenza B by PCR: NEGATIVE
SARS Coronavirus 2 by RT PCR: NEGATIVE

## 2020-04-03 LAB — COMPREHENSIVE METABOLIC PANEL
ALT: 22 U/L (ref 0–44)
AST: 27 U/L (ref 15–41)
Albumin: 3 g/dL — ABNORMAL LOW (ref 3.5–5.0)
Alkaline Phosphatase: 52 U/L (ref 38–126)
Anion gap: 24 — ABNORMAL HIGH (ref 5–15)
BUN: 117 mg/dL — ABNORMAL HIGH (ref 8–23)
CO2: 17 mmol/L — ABNORMAL LOW (ref 22–32)
Calcium: 9.1 mg/dL (ref 8.9–10.3)
Chloride: 95 mmol/L — ABNORMAL LOW (ref 98–111)
Creatinine, Ser: 10.45 mg/dL — ABNORMAL HIGH (ref 0.61–1.24)
GFR, Estimated: 5 mL/min — ABNORMAL LOW (ref >60)
Glucose, Bld: 218 mg/dL — ABNORMAL HIGH (ref 70–99)
Potassium: 7.1 mmol/L (ref 3.5–5.1)
Sodium: 136 mmol/L (ref 135–145)
Total Bilirubin: 0.8 mg/dL (ref 0.3–1.2)
Total Protein: 6 g/dL — ABNORMAL LOW (ref 6.5–8.1)

## 2020-04-03 LAB — BRAIN NATRIURETIC PEPTIDE
B Natriuretic Peptide: 368.3 pg/mL — ABNORMAL HIGH (ref 0.0–100.0)
B Natriuretic Peptide: 373.8 pg/mL — ABNORMAL HIGH (ref 0.0–100.0)

## 2020-04-03 LAB — BASIC METABOLIC PANEL
Anion gap: 23 — ABNORMAL HIGH (ref 5–15)
Anion gap: 19 — ABNORMAL HIGH (ref 5–15)
BUN: 103 mg/dL — ABNORMAL HIGH (ref 8–23)
BUN: 57 mg/dL — ABNORMAL HIGH (ref 8–23)
CO2: 16 mmol/L — ABNORMAL LOW (ref 22–32)
CO2: 22 mmol/L (ref 22–32)
Calcium: 8.8 mg/dL — ABNORMAL LOW (ref 8.9–10.3)
Calcium: 8.2 mg/dL — ABNORMAL LOW (ref 8.9–10.3)
Chloride: 94 mmol/L — ABNORMAL LOW (ref 98–111)
Chloride: 91 mmol/L — ABNORMAL LOW (ref 98–111)
Creatinine, Ser: 10.39 mg/dL — ABNORMAL HIGH (ref 0.61–1.24)
Creatinine, Ser: 6.28 mg/dL — ABNORMAL HIGH (ref 0.61–1.24)
GFR, Estimated: 5 mL/min — ABNORMAL LOW (ref >60)
GFR, Estimated: 9 mL/min — ABNORMAL LOW (ref >60)
Glucose, Bld: 131 mg/dL — ABNORMAL HIGH (ref 70–99)
Glucose, Bld: 136 mg/dL — ABNORMAL HIGH (ref 70–99)
Potassium: >7.5 mmol/L (ref 3.5–5.1)
Potassium: 5.5 mmol/L — ABNORMAL HIGH (ref 3.5–5.1)
Sodium: 133 mmol/L — ABNORMAL LOW (ref 135–145)
Sodium: 132 mmol/L — ABNORMAL LOW (ref 135–145)

## 2020-04-03 LAB — TROPONIN I (HIGH SENSITIVITY)
Troponin I (High Sensitivity): 33 ng/L — ABNORMAL HIGH (ref <18)
Troponin I (High Sensitivity): 41 ng/L — ABNORMAL HIGH (ref <18)

## 2020-04-03 LAB — HEPATITIS B CORE ANTIBODY, TOTAL: Hep B Core Total Ab: NONREACTIVE

## 2020-04-03 LAB — CREATININE, URINE, RANDOM: Creatinine, Urine: 43.52 mg/dL

## 2020-04-03 LAB — HEMOGLOBIN A1C
Hgb A1c MFr Bld: 7.5 % — ABNORMAL HIGH (ref 4.8–5.6)
Mean Plasma Glucose: 168.55 mg/dL

## 2020-04-03 LAB — PROTEIN / CREATININE RATIO, URINE
Creatinine, Urine: 43.44 mg/dL
Protein Creatinine Ratio: 2 mg/mg{Cre} — ABNORMAL HIGH (ref 0.00–0.15)
Total Protein, Urine: 87 mg/dL

## 2020-04-03 LAB — CORTISOL: Cortisol, Plasma: 89.2 ug/dL

## 2020-04-03 LAB — LIPASE, BLOOD: Lipase: 23 U/L (ref 11–51)

## 2020-04-03 LAB — MRSA PCR SCREENING: MRSA by PCR: POSITIVE — AB

## 2020-04-03 LAB — PROCALCITONIN: Procalcitonin: 0.29 ng/mL

## 2020-04-03 LAB — AMYLASE: Amylase: 127 U/L — ABNORMAL HIGH (ref 28–100)

## 2020-04-03 LAB — APTT: aPTT: 33 seconds (ref 24–36)

## 2020-04-03 LAB — HEPATITIS B SURFACE ANTIGEN: Hepatitis B Surface Ag: NONREACTIVE

## 2020-04-03 LAB — MAGNESIUM: Magnesium: 1.6 mg/dL — ABNORMAL LOW (ref 1.7–2.4)

## 2020-04-03 LAB — SODIUM, URINE, RANDOM: Sodium, Ur: 71 mmol/L

## 2020-04-03 LAB — PHOSPHORUS: Phosphorus: 8.2 mg/dL — ABNORMAL HIGH (ref 2.5–4.6)

## 2020-04-03 IMAGING — DX DG CHEST 1V PORT
2 series · 2 of 2 positions shown · non-contrast
Comparison: Same-day radiograph

CLINICAL DATA: Central line placement

EXAM:
PORTABLE CHEST 1 VIEW

[chest ap (1 of 2)]
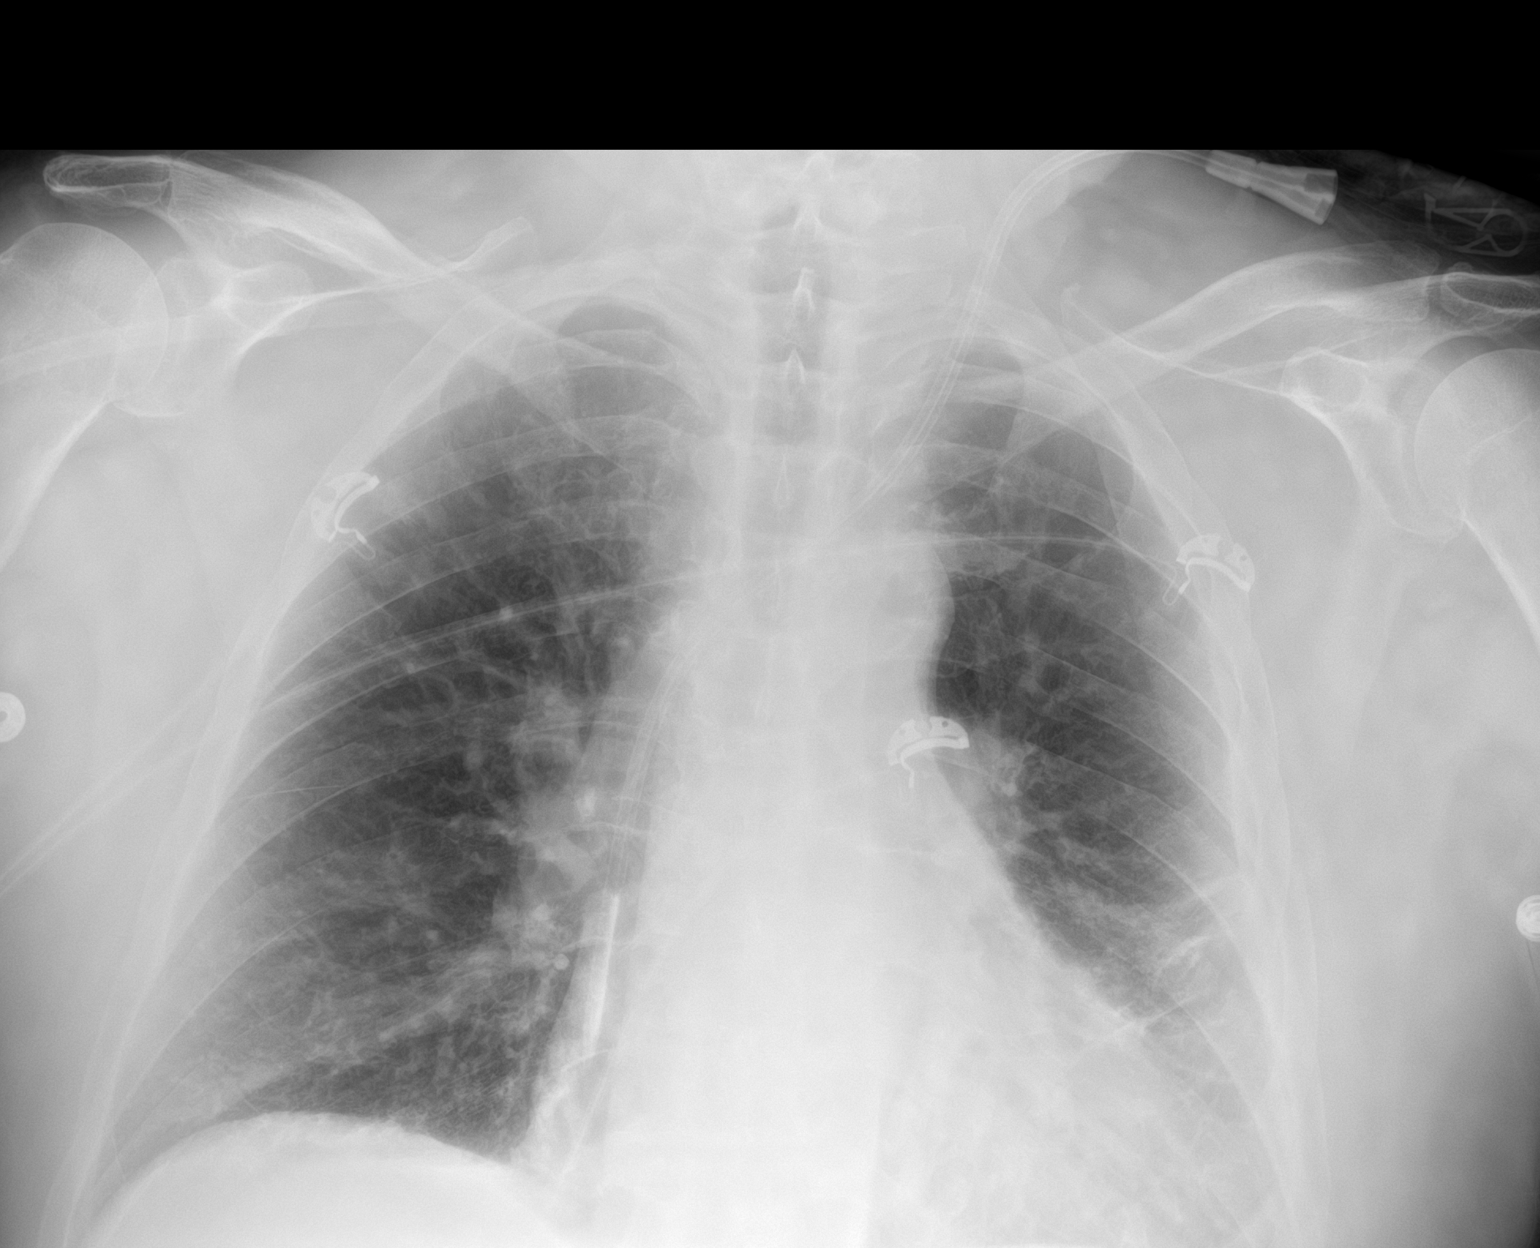

[chest ap (2 of 2)]
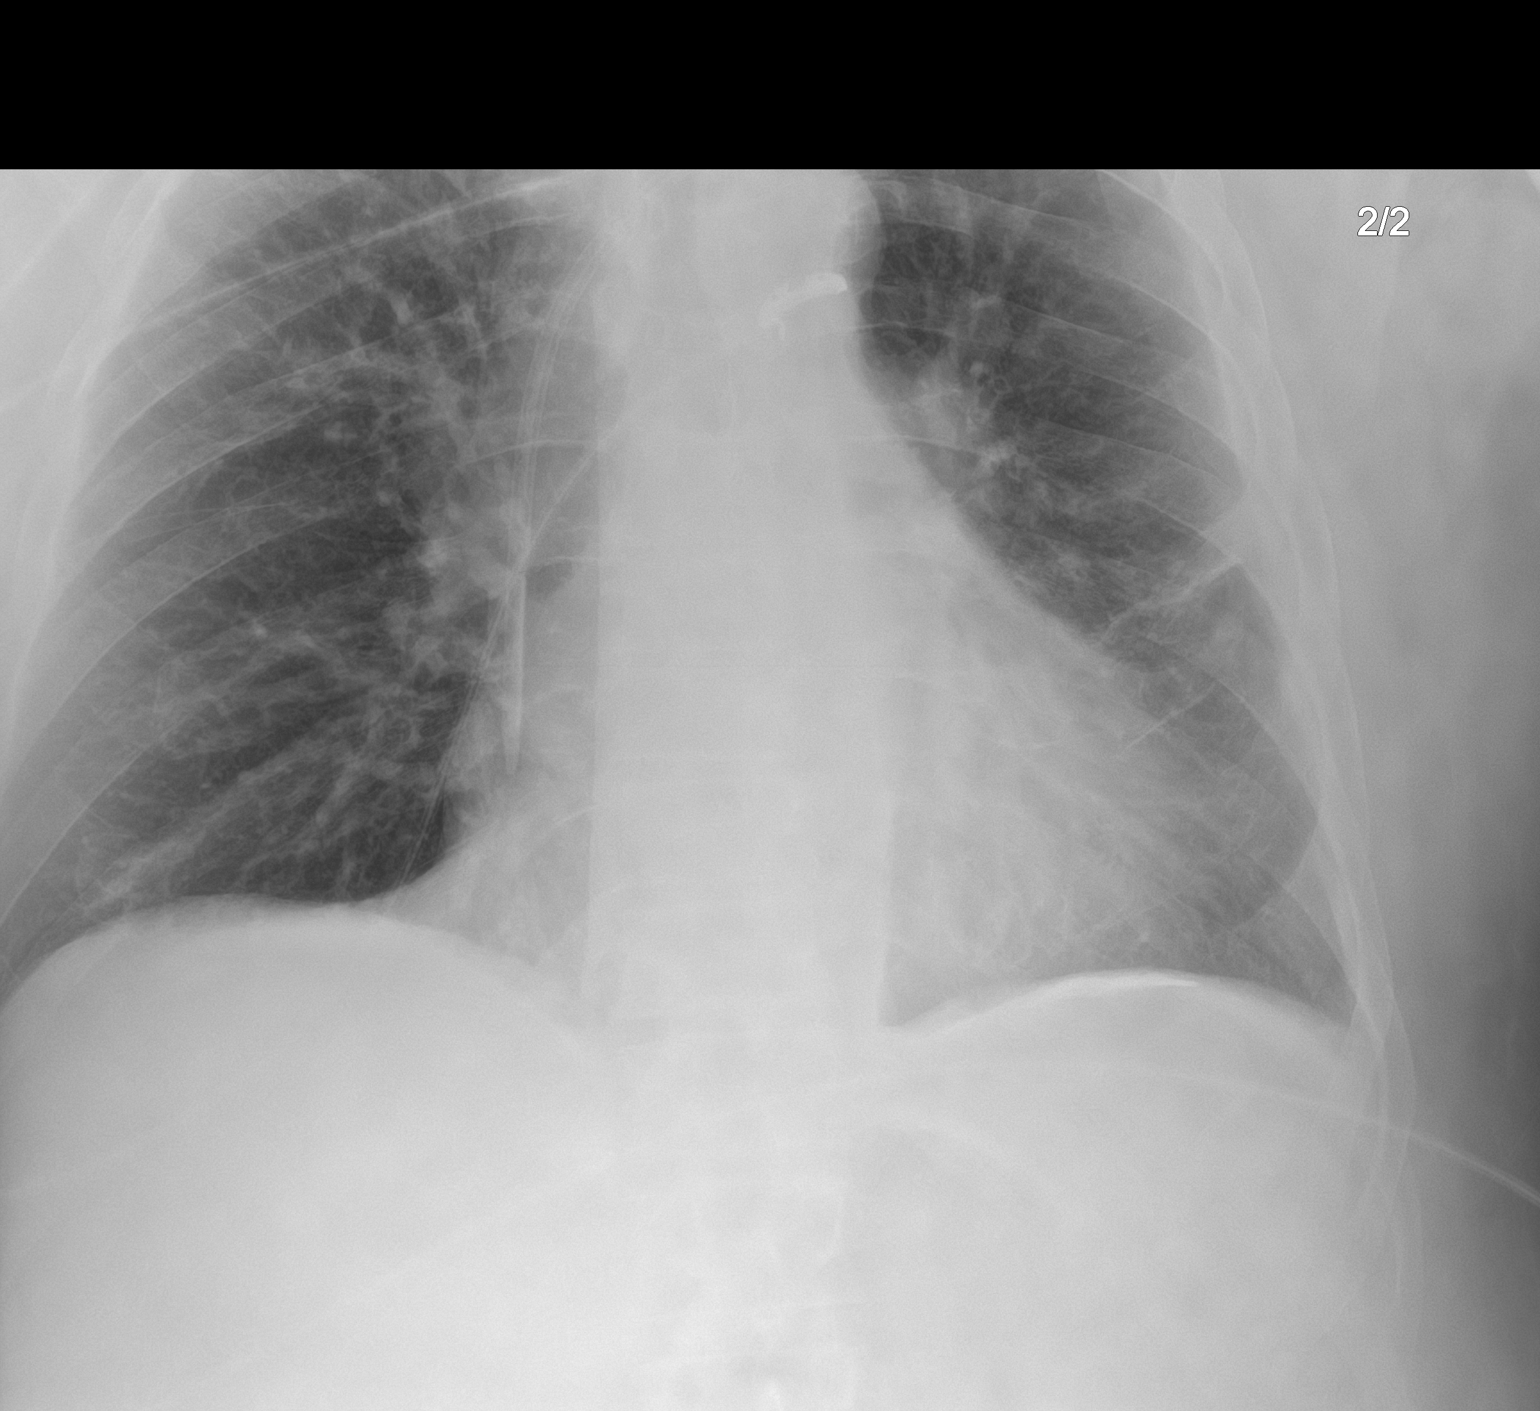

[2 of 2 positions shown; findings below may reference images not displayed]

FINDINGS: Interval placement of left internal jugular approach dual lumen
central venous catheter with distal tip terminating at the level of
the right atrium. Stable cardiomediastinal contours. Atherosclerotic
calcification of the aortic knob. Linear atelectasis within the left
mid lung. No pleural effusion or pneumothorax.
IMPRESSION: Interval placement of left internal jugular approach central venous
catheter with distal tip terminating at the level of the right
atrium. No pneumothorax.

## 2020-04-03 IMAGING — US US RENAL
1 series · 14 of 23 positions shown · non-contrast
Comparison: CTA chest [DATE].

CLINICAL DATA: 69-year-old male with renal failure.

EXAM:
RENAL / URINARY TRACT ULTRASOUND COMPLETE

[Series 1: us renal · 14 of 23 slices shown]
[im 1/23]
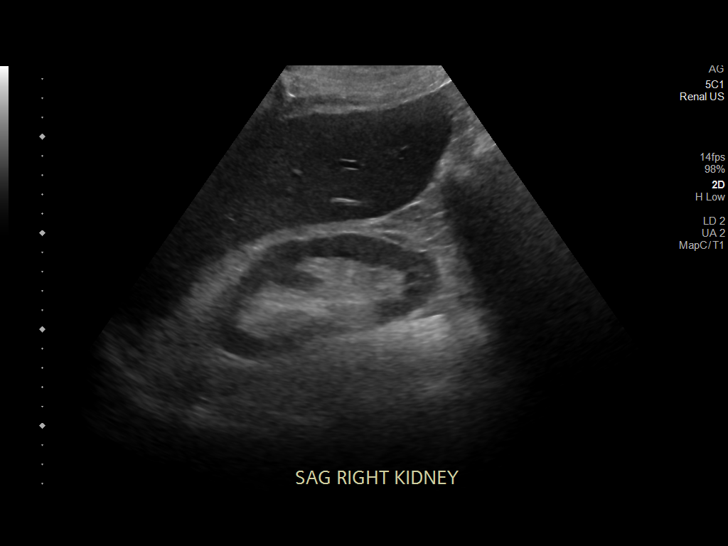
[im 3/23]
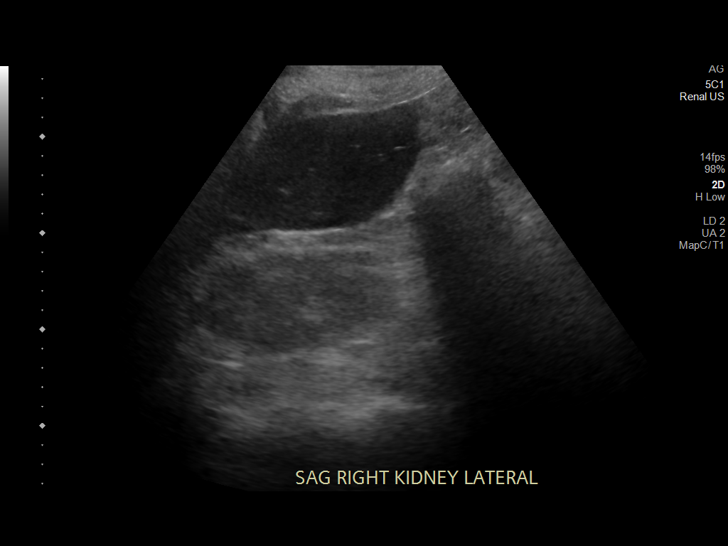
[im 5/23]
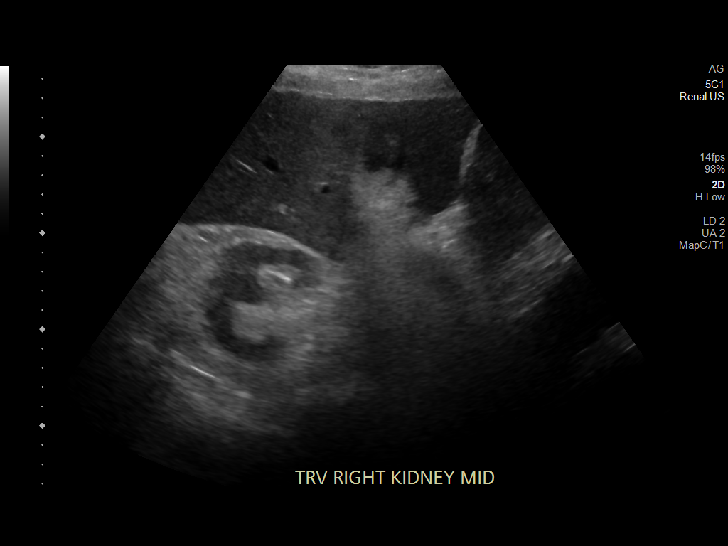
[im 6/23]
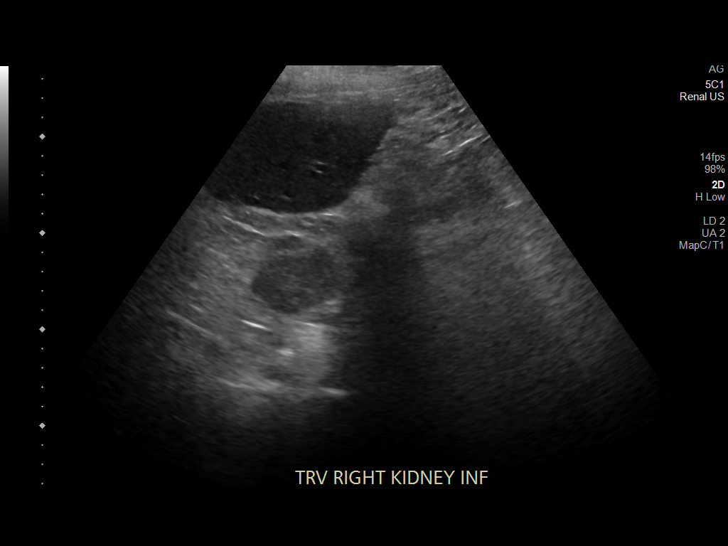
[im 8/23]
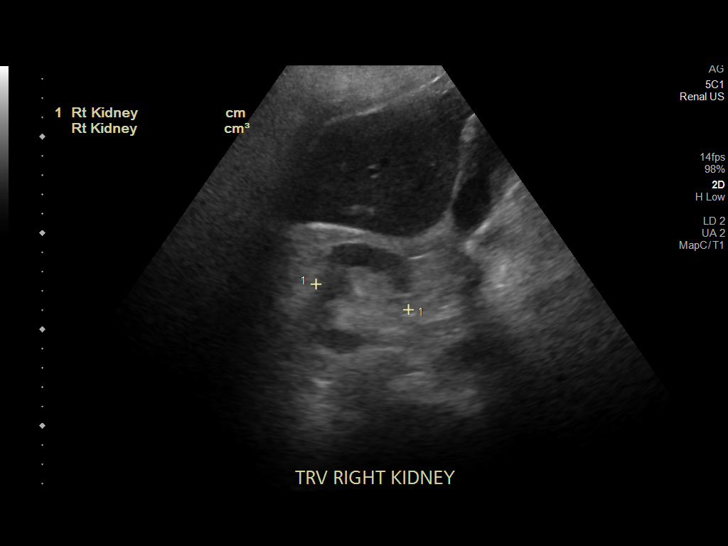
[im 10/23]
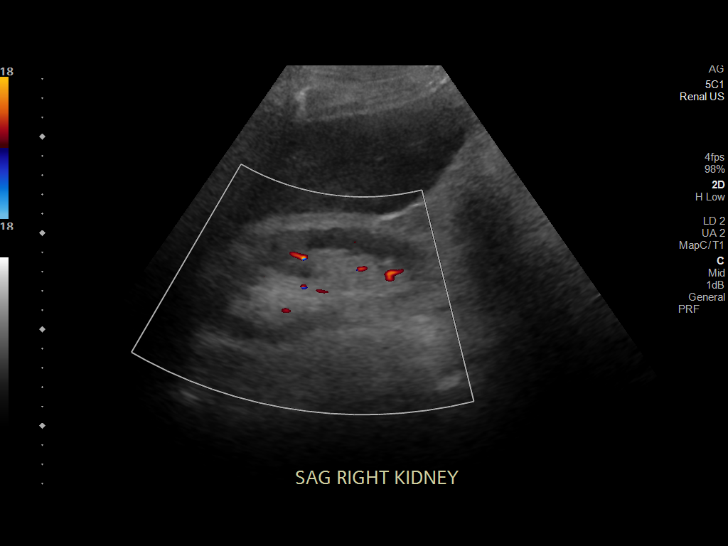
[im 11/23]
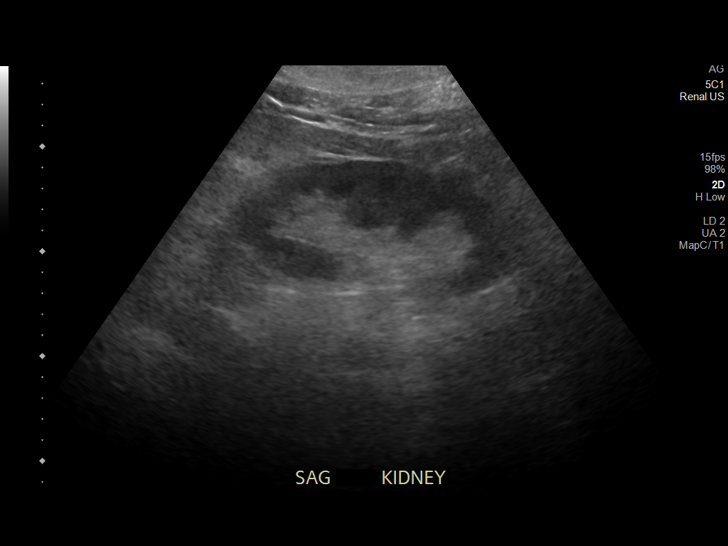
[im 13/23]
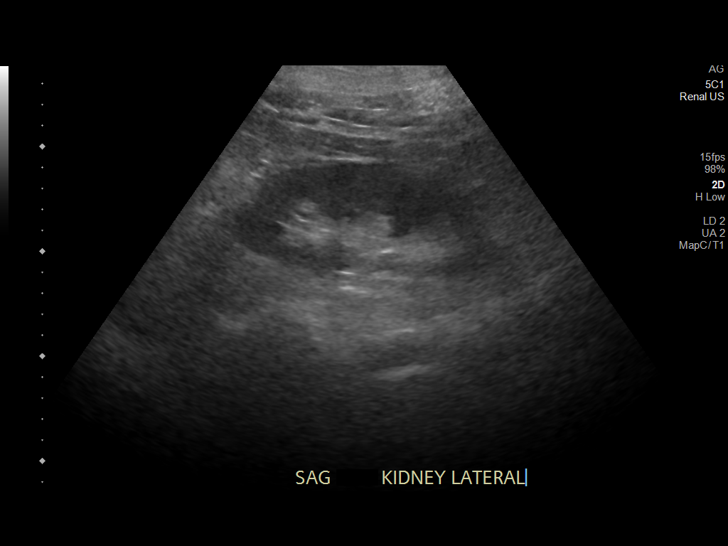
[im 14/23]
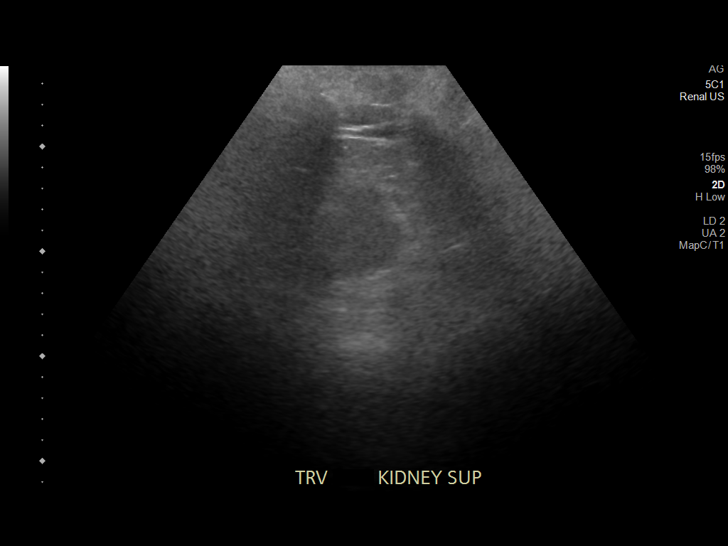
[im 16/23]
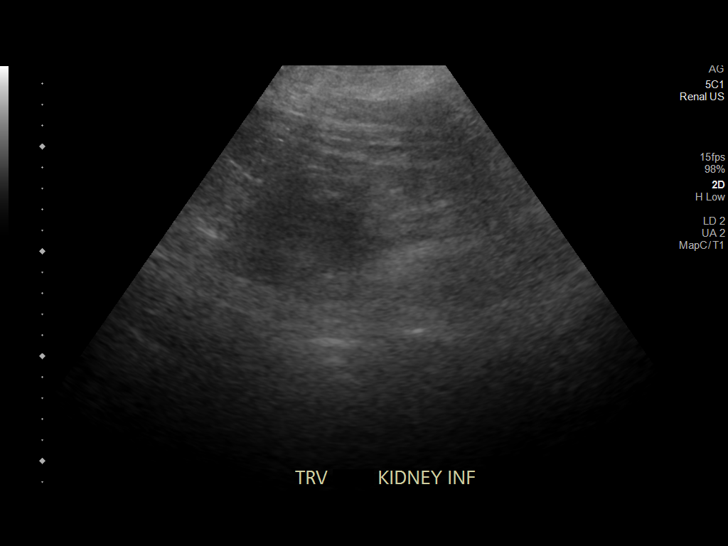
[im 18/23]
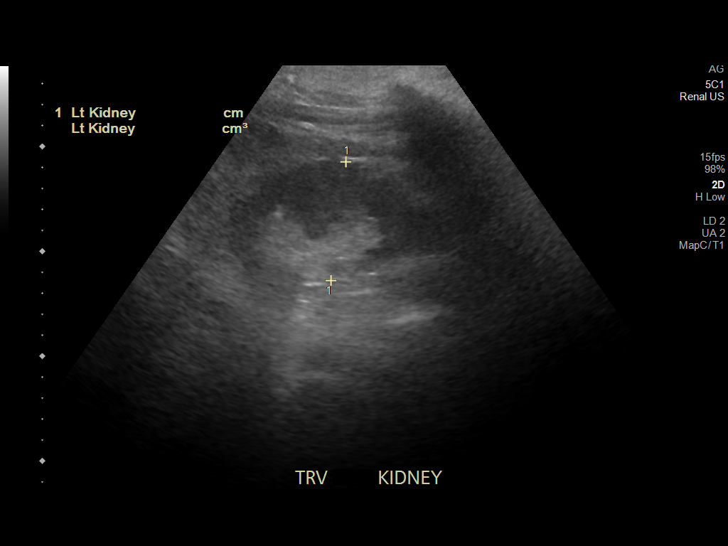
[im 19/23]
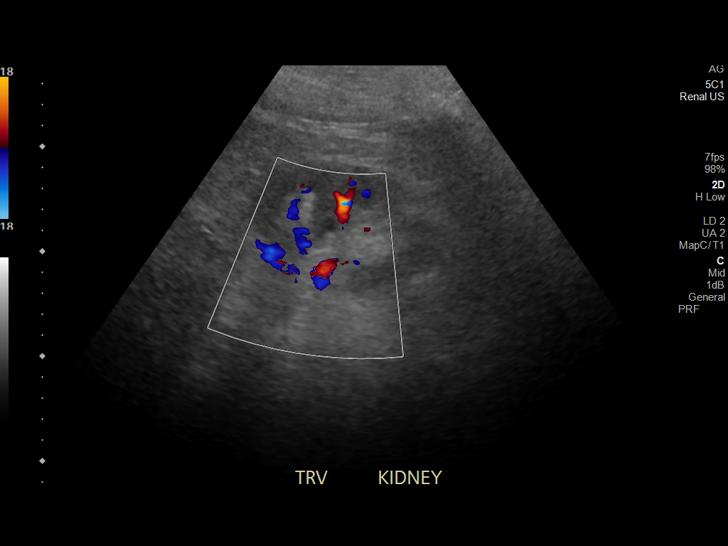
[im 21/23]
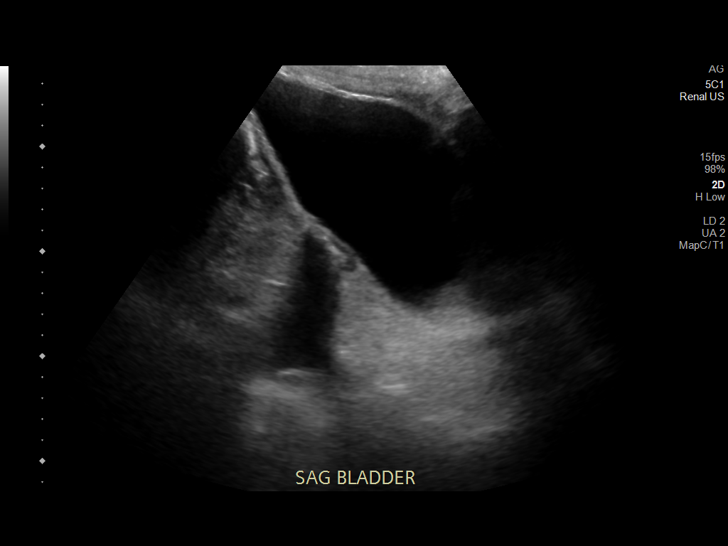
[im 23/23]
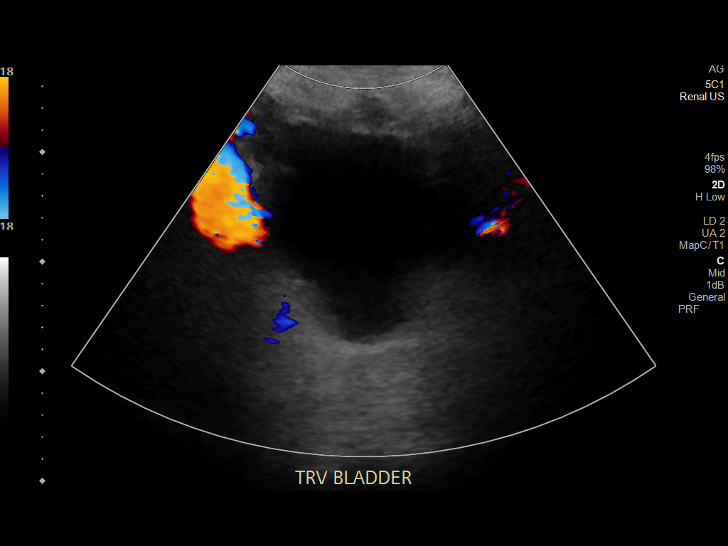

[14 of 23 positions shown; findings below may reference images not displayed]

FINDINGS: Right Kidney:

Renal measurements: 12.1 x 5.5 x 5.0 cm = volume: 171 mL.
Echogenicity within normal limits. No mass or hydronephrosis
visualized.

Left Kidney:

Renal measurements: 13.0 x 5.4 x 5.7 cm = volume: 210 mL.
Echogenicity within normal limits. No mass or hydronephrosis
visualized.

Bladder:

Appears normal for degree of bladder distention.

Other:

None.
IMPRESSION: Normal ultrasound appearance of both kidneys and the urinary
bladder.

## 2020-04-03 IMAGING — DX DG CHEST 1V PORT
1 series · 1 of 1 positions shown · non-contrast
Comparison: [DATE].

CLINICAL DATA: Shortness of breath.

EXAM:
PORTABLE CHEST 1 VIEW

[chest ap]
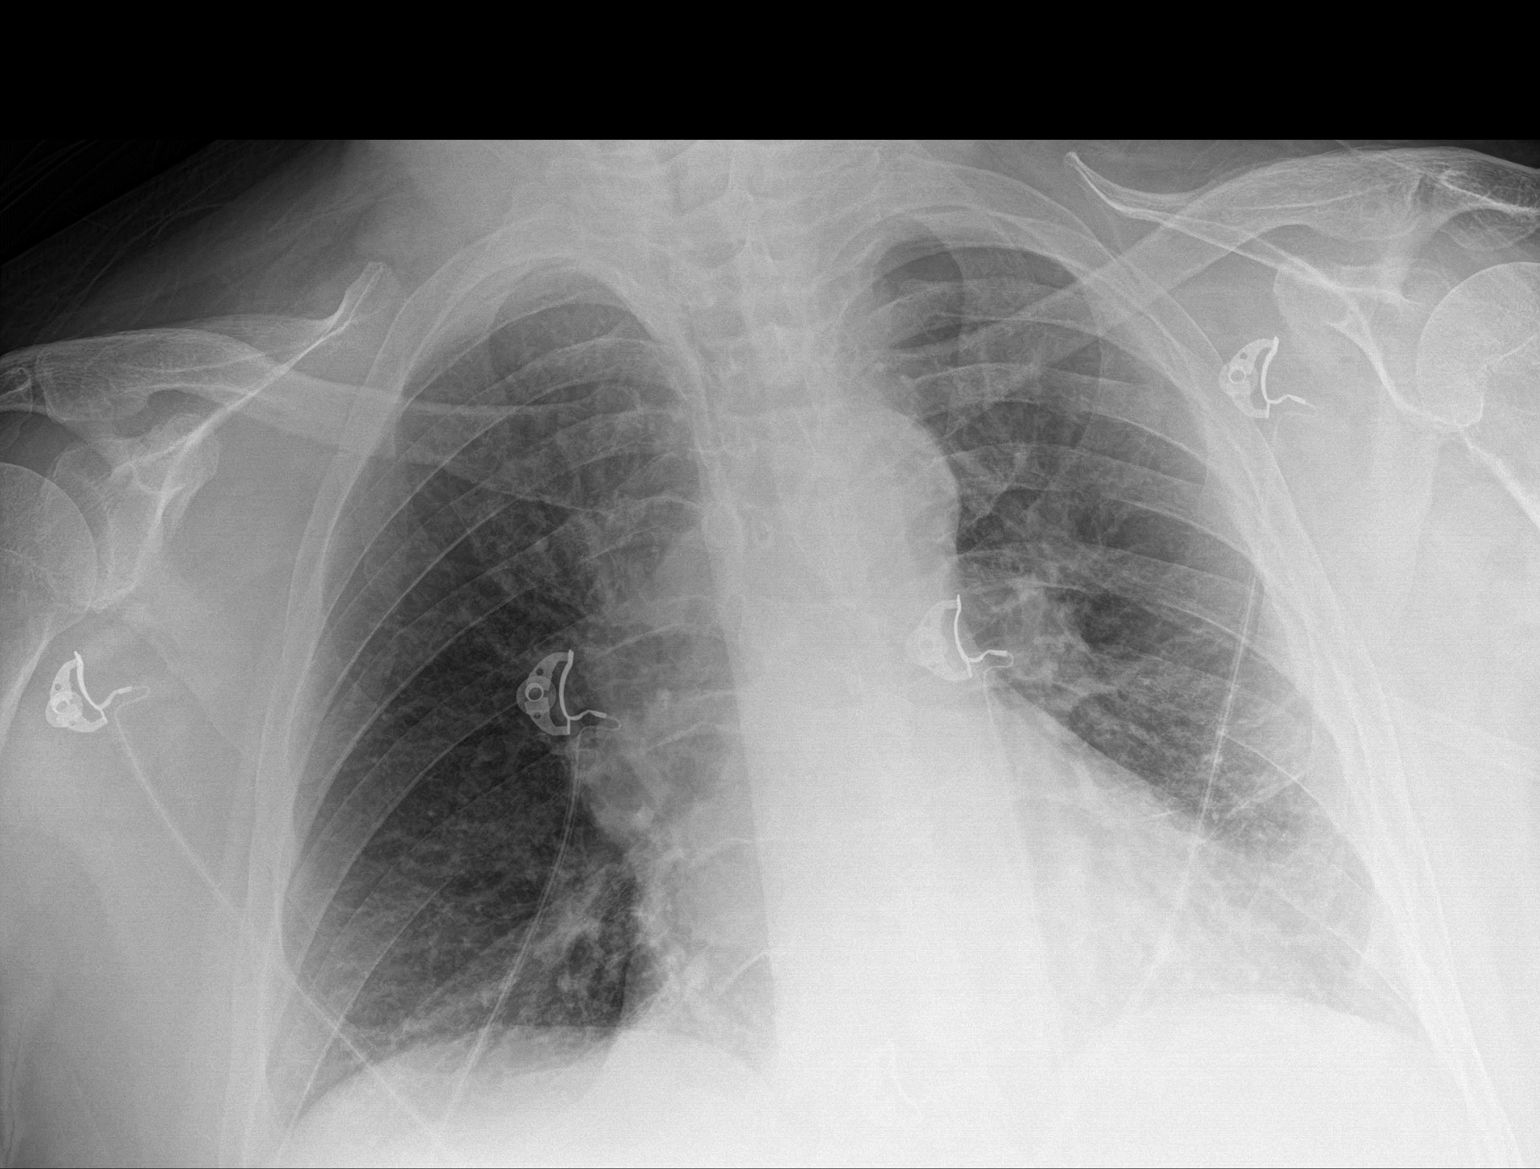

[1 of 1 positions shown; findings below may reference images not displayed]

FINDINGS: Right hilar prominence is concerning for possible mass or
adenopathy. No pneumothorax pleural effusion is noted. Right lung is
clear. Stable pleural thickening is noted in the left lung
laterally. Mild left basilar atelectasis or scarring possibly
inflammation is noted. Bony thorax is unremarkable.
IMPRESSION: Right hilar prominence is noted concerning for possible mass or
adenopathy. CT scan of the chest with intravenous contrast is
recommended for further evaluation. Mild left basilar atelectasis or
scarring is noted.

## 2020-04-03 MED ORDER — ALBUTEROL SULFATE (2.5 MG/3ML) 0.083% IN NEBU
10.0000 mg | INHALATION_SOLUTION | Freq: Once | RESPIRATORY_TRACT | Status: AC
  Administered 2020-04-03: 10 mg via RESPIRATORY_TRACT
  Filled 2020-04-03: qty 12

## 2020-04-03 MED ORDER — WHITE PETROLATUM EX OINT
TOPICAL_OINTMENT | CUTANEOUS | Status: AC
  Filled 2020-04-03: qty 28.35

## 2020-04-03 MED ORDER — DOCUSATE SODIUM 100 MG PO CAPS: 100.0000 mg | ORAL_CAPSULE | Freq: Two times a day (BID) | ORAL | Status: DC | PRN

## 2020-04-03 MED ORDER — SODIUM CHLORIDE 0.9 % IV SOLN: INTRAVENOUS | Status: DC

## 2020-04-03 MED ORDER — SODIUM BICARBONATE 8.4 % IV SOLN
INTRAVENOUS | Status: DC
  Filled 2020-04-03 (×2): qty 100

## 2020-04-03 MED ORDER — ALBUTEROL (5 MG/ML) CONTINUOUS INHALATION SOLN
INHALATION_SOLUTION | RESPIRATORY_TRACT | Status: AC
  Filled 2020-04-03: qty 20

## 2020-04-03 MED ORDER — SODIUM BICARBONATE 8.4 % IV SOLN
50.0000 meq | Freq: Once | INTRAVENOUS | Status: AC
  Administered 2020-04-03: 50 meq via INTRAVENOUS
  Filled 2020-04-03: qty 50

## 2020-04-03 MED ORDER — INSULIN ASPART 100 UNIT/ML IV SOLN
10.0000 [IU] | Freq: Once | INTRAVENOUS | Status: AC
  Administered 2020-04-03: 10 [IU] via INTRAVENOUS

## 2020-04-03 MED ORDER — PANTOPRAZOLE SODIUM 40 MG IV SOLR
40.0000 mg | Freq: Every day | INTRAVENOUS | Status: DC
  Administered 2020-04-03 – 2020-04-05 (×3): 40 mg via INTRAVENOUS
  Filled 2020-04-03 (×3): qty 40

## 2020-04-03 MED ORDER — SODIUM CHLORIDE 0.9 % IV BOLUS
1000.0000 mL | Freq: Once | INTRAVENOUS | Status: AC
  Administered 2020-04-03: 1000 mL via INTRAVENOUS

## 2020-04-03 MED ORDER — SODIUM BICARBONATE 8.4 % IV SOLN
INTRAVENOUS | Status: AC
  Administered 2020-04-03: 50 meq
  Filled 2020-04-03: qty 50

## 2020-04-03 MED ORDER — CALCIUM GLUCONATE 10 % IV SOLN: 1.0000 g | Freq: Once | INTRAVENOUS | Status: AC

## 2020-04-03 MED ORDER — CALCIUM GLUCONATE 10 % IV SOLN
INTRAVENOUS | Status: AC
  Administered 2020-04-03: 1 g via INTRAVENOUS
  Filled 2020-04-03: qty 10

## 2020-04-03 MED ORDER — HEPARIN SODIUM (PORCINE) 1000 UNIT/ML IJ SOLN
3400.0000 [IU] | Freq: Once | INTRAMUSCULAR | Status: AC
  Administered 2020-04-03: 3400 [IU]

## 2020-04-03 MED ORDER — SODIUM BICARBONATE 8.4 % IV SOLN
Freq: Once | INTRAVENOUS | Status: DC
  Filled 2020-04-03: qty 100

## 2020-04-03 MED ORDER — ALBUTEROL SULFATE (2.5 MG/3ML) 0.083% IN NEBU
2.5000 mg | INHALATION_SOLUTION | Freq: Four times a day (QID) | RESPIRATORY_TRACT | Status: DC | PRN
  Administered 2020-04-03 – 2020-04-04 (×2): 2.5 mg via RESPIRATORY_TRACT
  Filled 2020-04-03 (×2): qty 3

## 2020-04-03 MED ORDER — MUPIROCIN 2 % EX OINT
TOPICAL_OINTMENT | Freq: Two times a day (BID) | CUTANEOUS | Status: DC
  Administered 2020-04-05: 1 via NASAL
  Filled 2020-04-03 (×2): qty 22

## 2020-04-03 MED ORDER — CHLORHEXIDINE GLUCONATE CLOTH 2 % EX PADS
6.0000 | MEDICATED_PAD | Freq: Every day | CUTANEOUS | Status: DC
  Administered 2020-04-04 – 2020-04-07 (×4): 6 via TOPICAL

## 2020-04-03 MED ORDER — ORAL CARE MOUTH RINSE
15.0000 mL | Freq: Two times a day (BID) | OROMUCOSAL | Status: DC
  Administered 2020-04-03 – 2020-04-08 (×9): 15 mL via OROMUCOSAL

## 2020-04-03 MED ORDER — HEPARIN SODIUM (PORCINE) 1000 UNIT/ML IJ SOLN: 4000.0000 [IU] | Freq: Once | INTRAMUSCULAR | Status: DC

## 2020-04-03 MED ORDER — DEXTROSE 50 % IV SOLN
1.0000 | Freq: Once | INTRAVENOUS | Status: AC
  Administered 2020-04-03: 50 mL via INTRAVENOUS
  Filled 2020-04-03: qty 50

## 2020-04-03 MED ORDER — INSULIN ASPART 100 UNIT/ML IV SOLN
5.0000 [IU] | Freq: Once | INTRAVENOUS | Status: AC
  Administered 2020-04-03: 5 [IU] via INTRAVENOUS

## 2020-04-03 MED ORDER — INSULIN ASPART 100 UNIT/ML ~~LOC~~ SOLN
0.0000 [IU] | SUBCUTANEOUS | Status: DC
  Administered 2020-04-03 – 2020-04-05 (×5): 1 [IU] via SUBCUTANEOUS
  Administered 2020-04-05: 2 [IU] via SUBCUTANEOUS
  Administered 2020-04-05 – 2020-04-06 (×2): 1 [IU] via SUBCUTANEOUS

## 2020-04-03 MED ORDER — POLYETHYLENE GLYCOL 3350 17 G PO PACK: 17.0000 g | PACK | Freq: Every day | ORAL | Status: DC | PRN

## 2020-04-03 MED ORDER — SODIUM ZIRCONIUM CYCLOSILICATE 10 G PO PACK
10.0000 g | PACK | Freq: Once | ORAL | Status: AC
  Administered 2020-04-03: 10 g via ORAL
  Filled 2020-04-03: qty 1

## 2020-04-03 MED ORDER — CALCIUM GLUCONATE 10 % IV SOLN
1.0000 g | Freq: Once | INTRAVENOUS | Status: AC
  Administered 2020-04-03: 1 g via INTRAVENOUS
  Filled 2020-04-03: qty 10

## 2020-04-03 MED ORDER — HEPARIN SODIUM (PORCINE) 1000 UNIT/ML IJ SOLN
INTRAMUSCULAR | Status: AC
  Filled 2020-04-03: qty 4

## 2020-04-03 NOTE — Progress Notes (Signed)
CRITICAL VALUE ALERT  Critical Value:  Lactic Acid 8.5  Date & Time Notied:  04/03/20 1350  Provider Notified: Marni Griffon NP  Orders Received/Actions taken: No new orders.  Irven Baltimore, RN

## 2020-04-03 NOTE — Progress Notes (Signed)
CRITICAL VALUE ALERT  Critical Value:  K-7.1  Date & Time Notied:  04/03/20 1400  Provider Notified: Marni Griffon NP  Orders Received/Actions taken: No additional orders.   Irven [place], RN

## 2020-04-03 NOTE — ED Provider Notes (Signed)
[facility] EMERGENCY DEPARTMENT Provider Note   CSN: [id number] Arrival date & time: 04/03/20  1001     History Chief Complaint  Patient presents with  . Shortness of Breath    Rodney [name] is a 69 y.o. male.  Pt presents to the ED today with sob and n/v.  Pt feels like he can't move any of his body.  EMS put him on a NRB and gave him atropine b/c HR was low.  Pt feels no better.  Pt has had several episodes of n/v.  He is a poor historian.  He has a hx of afib/CAD/CM and is amiodarone, losartan, carvedilol, asa, and warfarin.  He last had a heart cath in June which showed:     IMPRESSION: Mr. Deutschman has mild nonobstructive disease in his left system including the AV groove circumflex and LAD with a chronically occluded RCA, faint grade 1 left to right collaterals and a 30 mm peak to peak gradient across his aortic valve.  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  His LVEDP was 35.    Medical therapy recommended.  He also had a left BKA during that hospital stay in June.  No hx of ESRD.         Past Medical History:  Diagnosis Date  . Diabetes mellitus without complication ([place])   . HLD (hyperlipidemia)   . Hypertension     Patient Active Problem List   Diagnosis Date Noted  . Acute renal failure (ARF) ([place]) 04/03/2020  . Cardiomyopathy ([place]) 02/15/2020  . CAD (coronary artery disease) 02/15/2020  . CRI (chronic renal insufficiency), stage 3 (moderate) ([place]) 02/15/2020  . Long term (current) use of anticoagulants 12/29/2019  . S/P BKA (below knee amputation) unilateral, left ([place]) 11/14/2019  . High risk social situation 11/14/2019  . Atrial fibrillation ([place]) 10/03/2019  . Sepsis ([place]) 09/28/2019  . HFrEF (heart failure with reduced ejection fraction) ([place]) 09/28/2019  . Moderate aortic stenosis 09/28/2019  . Anemia in chronic illness 09/27/2019  . PVD (peripheral vascular disease) ([place])   . SOB (shortness of breath) 07/26/2018   . Peripheral edema 02/24/2018  . Umbilical hernia 74/25/9563  . Obesity 10/09/2015  . Tobacco abuse 09/07/2013  . Uncontrolled type 2 diabetes mellitus with diabetic neuropathy, with long-term current use of insulin ([place]) 09/07/2013  . Colon cancer screening 09/07/2013  . Hypercholesteremia 10/28/2010  . ERECTILE DYSFUNCTION 05/22/2009  . Essential hypertension 01/23/2009    Past Surgical History:  Procedure Laterality Date  . AMPUTATION Left 09/28/2019   Procedure: AMPUTATION BELOW KNEE;  Surgeon: Newt Minion, MD;  Location: [facility];  Service: Orthopedics;  Laterality: Left;  . CARDIOVERSION N/A 10/05/2019   Procedure: CARDIOVERSION;  Surgeon: Sanda Klein, MD;  Location: [facility];  Service: Cardiovascular;  Laterality: N/A;  . LEFT HEART CATH AND CORONARY ANGIOGRAPHY N/A 10/03/2019   Procedure: LEFT HEART CATH AND CORONARY ANGIOGRAPHY;  Surgeon: Lorretta Harp, MD;  Location: [facility];  Service: Cardiovascular;  Laterality: N/A;  . TEE WITHOUT CARDIOVERSION N/A 10/05/2019   Procedure: TRANSESOPHAGEAL ECHOCARDIOGRAM (TEE);  Surgeon: Sanda Klein, MD;  Location: [facility];  Service: Cardiovascular;  Laterality: N/A;       Family History  Problem Relation Age of Onset  . Alcoholism Mother   . Alcoholism Father     Social History   Tobacco Use  . Smoking status: Current Every Day Smoker    Packs/day: 0.50    Types: Cigarettes  .  Smokeless tobacco: Never Used  Substance Use Topics  . Alcohol use: Yes    Alcohol/week: 6.0 standard drinks    Types: 6 Standard drinks or equivalent per week  . Drug use: No    Home Medications Prior to Admission medications   Medication Sig Start Date End Date Taking? Authorizing Provider  amiodarone (PACERONE) 200 MG tablet Take 1 tablet (200 mg total) by mouth daily. 11/13/19   [name], MD  aspirin EC 81 MG tablet Take 1 tablet (81 mg total) by mouth daily. Swallow whole. 11/03/19   Geralynn Rile, MD   atorvastatin (LIPITOR) 80 MG tablet TAKE 1 TABLET EVERY DAY 03/25/20   O'Neal, Cassie Freer, MD  Blood Glucose Monitoring Suppl (TRUE METRIX METER) DEVI Use to test blood sugar three times daily. 11/20/19   [name], MD  Blood Glucose Monitoring Suppl (TRUE METRIX METER) w/Device KIT USE AS DIRECTED 03/25/20   [name], MD  carvedilol (COREG) 12.5 MG tablet Take 1 tablet (12.5 mg total) by mouth 2 (two) times daily with a meal. 11/13/19   Hensel, Jamal Collin, MD  furosemide (LASIX) 20 MG tablet Take 1 tablet (20 mg total) by mouth daily. 11/13/19   [name], MD  glucose blood (RELION TRUE METRIX TEST STRIPS) test strip Use to test blood sugar three times per day. 11/20/19   [name], MD  insulin aspart protamine- aspart (NOVOLOG MIX 70/30) (70-30) 100 UNIT/ML injection Inject into the skin. 30 units q breakfast and 15 units q supper.  Only use until we can get you lantus    [provider]  losartan (COZAAR) 25 MG tablet Take 1 tablet (25 mg total) by mouth daily. Duplicate.  Losartan replaces lisinopril.  Please discontinue the lisinopril 11/13/19   [name], MD  metFORMIN (GLUCOPHAGE) 1000 MG tablet TAKE 1 TABLET TWICE DAILY WITH A MEAL 11/22/19   Hensel, Jamal Collin, MD  spironolactone (ALDACTONE) 25 MG tablet Take 12.5 mg by mouth daily.    [provider]  TRUEplus Lancets 33G MISC Use to test blood sugar three times per day. 11/20/19   [name], MD  warfarin (COUMADIN) 5 MG tablet Take 1 tablet (5 mg total) by mouth daily. 01/05/20   Hilty, Nadean Corwin, MD    Allergies    Patient has no known allergies.  Review of Systems   Review of Systems  Respiratory: Positive for shortness of breath.   Gastrointestinal: Positive for nausea and vomiting.  Neurological: Positive for weakness.  All other systems reviewed and are negative.   Physical Exam Updated Vital Signs BP (!) 107/59   Pulse (!) 56   Temp (!) 97.2 F (36.2 C)  (Oral)   Resp (!) 25   Ht 6' (1.829 m)   Wt 115.7 kg   SpO2 95%   BMI 34.58 kg/m   Physical Exam Vitals and nursing note reviewed.  Constitutional:      General: He is in acute distress.     Appearance: He is obese. He is ill-appearing.  HENT:     Head: Normocephalic and atraumatic.     Mouth/Throat:     Mouth: Mucous membranes are dry.  Eyes:     Extraocular Movements: Extraocular movements intact.     Pupils: Pupils are equal, round, and reactive to light.  Cardiovascular:     Rate and Rhythm: Bradycardia present.  Pulmonary:     Effort: Tachypnea present.     Breath  sounds: Wheezing present.  Abdominal:     General: Bowel sounds are normal.     Palpations: Abdomen is soft.  Musculoskeletal:     Cervical back: Normal range of motion.     Comments: Left bka RLE cold with decreased pulses  Skin:    General: Skin is moist.     Capillary Refill: Capillary refill takes more than 3 seconds.  Neurological:     Mental Status: He is alert.     Comments: Pt is diffusely weak.  He said he can't move any body part other than his head.     ED Results / Procedures / Treatments   Labs (all labs ordered are listed, but only abnormal results are displayed) Labs Reviewed  BASIC METABOLIC PANEL - Abnormal; Notable for the following components:      Result Value   Sodium 133 (*)    Potassium >7.5 (*)    Chloride 94 (*)    CO2 16 (*)    Glucose, Bld 131 (*)    BUN 103 (*)    Creatinine, Ser 10.39 (*)    Calcium 8.8 (*)    GFR, Estimated 5 (*)    Anion gap 23 (*)    All other components within normal limits  CBC WITH DIFFERENTIAL/PLATELET - Abnormal; Notable for the following components:   WBC 13.8 (*)    RBC 3.52 (*)    Hemoglobin 10.9 (*)    HCT 33.4 (*)    Neutro Abs 12.1 (*)    Abs Immature Granulocytes 0.10 (*)    All other components within normal limits  BRAIN NATRIURETIC PEPTIDE - Abnormal; Notable for the following components:   B Natriuretic Peptide 368.3 (*)     All other components within normal limits  PROTIME-INR - Abnormal; Notable for the following components:   Prothrombin Time 21.5 (*)    INR 1.9 (*)    All other components within normal limits  LACTIC ACID, PLASMA - Abnormal; Notable for the following components:   Lactic Acid, Venous 8.3 (*)    All other components within normal limits  I-STAT ARTERIAL BLOOD GAS, ED - Abnormal; Notable for the following components:   pH, Arterial 7.248 (*)    pO2, Arterial 81 (*)    Bicarbonate 16.5 (*)    TCO2 18 (*)    Acid-base deficit 10.0 (*)    Sodium 132 (*)    Potassium 6.7 (*)    Calcium, Ion 1.12 (*)    HCT 28.0 (*)    Hemoglobin 9.5 (*)    All other components within normal limits  TROPONIN I (HIGH SENSITIVITY) - Abnormal; Notable for the following components:   Troponin I (High Sensitivity) 33 (*)    All other components within normal limits  RESP PANEL BY RT-PCR (FLU A&B, COVID) ARPGX2  CULTURE, BLOOD (ROUTINE X 2)  CULTURE, BLOOD (ROUTINE X 2)  URINE CULTURE  LACTIC ACID, PLASMA  COMPREHENSIVE METABOLIC PANEL  MAGNESIUM  PHOSPHORUS  PROCALCITONIN  CORTISOL  CBC  PROTIME-INR  APTT  STREP PNEUMONIAE URINARY ANTIGEN  BLOOD GAS, ARTERIAL  AMYLASE  LIPASE, BLOOD  LACTIC ACID, PLASMA  LACTIC ACID, PLASMA  BRAIN NATRIURETIC PEPTIDE  I-STAT CHEM 8, ED  TROPONIN I (HIGH SENSITIVITY)    EKG EKG Interpretation  Date/Time:  Wednesday April 03 2020 10:24:22 EST Ventricular Rate:  0 PR Interval:    QRS Duration: 179 QT Interval:  684 QTC Calculation: 643 R Axis:   0 Text Interpretation: No significant change  since last tracing Confirmed by Isla Pence ([id number]) on 04/03/2020 12:12:03 PM   Radiology DG Chest Port 1 View  Result Date: 04/03/2020 CLINICAL DATA:  Shortness of breath. EXAM: PORTABLE CHEST 1 VIEW COMPARISON:  October 02, 2019. FINDINGS: Right hilar prominence is concerning for possible mass or adenopathy. No pneumothorax pleural effusion is noted. Right  lung is clear. Stable pleural thickening is noted in the left lung laterally. Mild left basilar atelectasis or scarring possibly inflammation is noted. Bony thorax is unremarkable. IMPRESSION: Right hilar prominence is noted concerning for possible mass or adenopathy. CT scan of the chest with intravenous contrast is recommended for further evaluation. Mild left basilar atelectasis or scarring is noted. Electronically Signed   By: Marijo Conception M.D.   On: 04/03/2020 10:31    Procedures Procedures (including critical care time)  Medications Ordered in ED Medications  sodium zirconium cyclosilicate (LOKELMA) packet 10 g (has no administration in time range)  docusate sodium (COLACE) capsule 100 mg (has no administration in time range)  polyethylene glycol (MIRALAX / GLYCOLAX) packet 17 g (has no administration in time range)  pantoprazole (PROTONIX) injection 40 mg (has no administration in time range)  sodium bicarbonate 100 mEq in dextrose 5 % 1,000 mL infusion (has no administration in time range)  Chlorhexidine Gluconate Cloth 2 % PADS 6 each (has no administration in time range)  sodium chloride 0.9 % bolus 1,000 mL (1,000 mLs Intravenous New Bag/Given 04/03/20 1020)  calcium gluconate inj 10% (1 g) URGENT USE ONLY! (1 g Intravenous Given 04/03/20 1030)  insulin aspart (novoLOG) injection 10 Units (10 Units Intravenous Given 04/03/20 1034)    And  dextrose 50 % solution 50 mL (50 mLs Intravenous Given 04/03/20 1035)  sodium bicarbonate 1 mEq/mL injection (50 mEq  Given 04/03/20 1032)  calcium gluconate inj 10% (1 g) URGENT USE ONLY! (1 g Intravenous Given 04/03/20 1040)  calcium gluconate inj 10% (1 g) URGENT USE ONLY! (1 g Intravenous Given 04/03/20 1104)  albuterol (PROVENTIL) (2.5 MG/3ML) 0.083% nebulizer solution 10 mg (10 mg Nebulization Given 04/03/20 1120)  insulin aspart (novoLOG) injection 5 Units (5 Units Intravenous Given 04/03/20 1122)    And  dextrose 50 % solution 50 mL (50 mLs  Intravenous Given 04/03/20 1113)  sodium bicarbonate injection 50 mEq (50 mEq Intravenous Given 04/03/20 1109)    ED Course  I have reviewed the triage vital signs and the nursing notes.  Pertinent labs & imaging results that were available during my care of the patient were reviewed by me and considered in my medical decision making (see chart for details).    MDM Rules/Calculators/A&P                           I took pt off the NRB and put him on oxygen at 4L via [place].  His oxygen saturations are doing well.  Due to the rhythm on EKG, I suspected hyperkalemia.  I asked the nurse to do an I-stat.  I-stat showed a K above the measurable level.  He was given 1g calcium gluconate, insulin/dextrose, bicarb.  No significant change in EKG.  Additional 1g calcium gluconate given with some improvement in ekg.  I then ordered a repeat of all of those meds + albuterol and lokelma. QRS is narrowing, but is still wide.  I spoke with nephrology who saw pt.  They will take him for emergent dialysis.  I spoke with critical care who also  saw pt and will admit.  Covid negative.  CRITICAL CARE Performed by: Isla Pence   Total critical care time: 60 minutes  Critical care time was exclusive of separately billable procedures and treating other patients.  Critical care was necessary to treat or prevent imminent or life-threatening deterioration.  Critical care was time spent personally by me on the following activities: development of treatment plan with patient and/or surrogate as well as nursing, discussions with consultants, evaluation of patient's response to treatment, examination of patient, obtaining history from patient or surrogate, ordering and performing treatments and interventions, ordering and review of laboratory studies, ordering and review of radiographic studies, pulse oximetry and re-evaluation of patient's condition.  ORRY SIGL was evaluated in Emergency Department on  04/03/2020 for the symptoms described in the history of present illness. He was evaluated in the context of the global COVID-19 pandemic, which necessitated consideration that the patient might be at risk for infection with the SARS-CoV-2 virus that causes COVID-19. Institutional protocols and algorithms that pertain to the evaluation of patients at risk for COVID-19 are in a state of rapid change based on information released by regulatory bodies including the CDC and federal and state organizations. These policies and algorithms were followed during the patient's care in the ED.  Final Clinical Impression(s) / ED Diagnoses Final diagnoses:  Acute renal failure, unspecified acute renal failure type ([place])  Hyperkalemia    Rx / DC Orders ED Discharge Orders    None       Isla Pence, MD 04/03/20 1233

## 2020-04-03 NOTE — ED Triage Notes (Signed)
BIB EMS for SOB. Pt's arrived on NRB and very tachypneic, labored breathing. Ox saturation 100%. HR ranged 40-50bpm and was given a total of 1mg  atropine. Pt reported vomiting about 20x last night. Pt is very weak and has not been able to get around since last night.

## 2020-04-03 NOTE — Progress Notes (Signed)
PCP note.  I have been Rodney [name]'s PCP since 1984.  He has longstanding DM, HBP, central obesity and poor lifestyle.  He does not come to physicians often.  Generally he comes only when ill or when I will no longer refill his medications.   Personally, he is a kind, hard-headed man.  I see that he is in good hands medically and will not intrude.   I am available for any help you might need: socially, past medical history or emotionally.  My direct cell # is [phone]. The [facility] Teaching service is also open to accepting him when he no longer requires intensive care. Please let me know how we can help can for Rodney [name].

## 2020-04-03 NOTE — Plan of Care (Signed)
  Problem: Education: Goal: Knowledge of disease and its progression will improve Outcome: Progressing   Problem: Health Behavior/Discharge Planning: Goal: Ability to manage health-related needs will improve Outcome: Not Progressing   Problem: Clinical Measurements: Goal: Complications related to the disease process or treatment will be avoided or minimized Outcome: Not Progressing Goal: Dialysis access will remain free of complications Outcome: Progressing   Problem: Activity: Goal: Activity intolerance will improve Outcome: Not Progressing   Problem: Fluid Volume: Goal: Fluid volume balance will be maintained or improved Outcome: Progressing   Problem: Nutritional: Goal: Ability to make appropriate dietary choices will improve Outcome: Not Progressing   Problem: Respiratory: Goal: Respiratory symptoms related to disease process will be avoided Outcome: Progressing   Problem: Urinary Elimination: Goal: Progression of disease will be identified and treated Outcome: Progressing   Problem: Nutrition Goal: Patient maintains adequate hydration Outcome: Progressing Goal: Patient/Family demonstrates understanding of diet Outcome: Not Progressing

## 2020-04-03 NOTE — Progress Notes (Signed)
[facility] Progress Note Patient Name: CALIXTO PAVEL DOB: 09-26-1950 MRN: [id number]   Date of Service  04/03/2020  HPI/Events of Note  Patient feels a bit short of breath and is asking for a breathing treatment, bedside RN reports diminished bilateral breath sounds with few wheezes.  eICU Interventions  PRN Albuterol nebs ordered.        Kerry Kass Curtez Brallier 04/03/2020, 8:31 PM

## 2020-04-03 NOTE — Consult Note (Addendum)
Reason for Consult: Hyperkalemia Referring Physician:  Dr. Rusty Aus is an 69 y.o. male.   Assessment and Plan:  1) Acute Kidney Injury  On CKDIIIA - creatinine has been slowly increasing since 10/03/2019 when int was 0.97. Cr was 1.23 02/15/2020 and then on presentation 10.39 with hyperkalemia.  - Need to r/o obstruction with renal ultrasound. - Urine studies (ur na and UPC) - Will send serologies if there's any e/o active sediment or significant proteinuria. - Hesitant to give too much volume as he is already edematous and h/o HFrEF; hold Lasix, spironolactone and ARB for now. - Renal dose medications for GFR <15. - With the significant EKG changes c/w hyperkalemia pt has already gotten protocol; planning to dialyze emergently. Appreciate CCM agreeing to place a dialysis catheter.  COVID neg.  Seen @ 450PM on dialysis 1K bath for 1st hour and then 2K Tolerating UF as well -> goal 1-2 liters QRS narrow complex now fortunately and patient feels much better Spouse bedside  2) Hyperkalemia with peaked t waves + bradycardia - see above but mainly need RRT emergently to temporize.  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  HPI:  This is a 69 year old male with a history of HTN, PVD, HFrEF (EF 35-40%), mod aortic stenosis, a fib on coumadin, cardiomyopathy, CAD, CKD stage 3, HLD, tobacco use, obesity and left low/mid knee amputation in 6/21 who presented with worsening SOB on a NRB, labored breathing, bradycardia down to 40-50s and received atropine and noted to have abnormal EKG and iSTAT K of >8.5.   Patient reports that he was in his usual state of health until yesterday evening when he started having nausea, vomiting, and diarrhea. He reports that he felt generally weak and was not able to get up. He endorses some chills at this time as well as shortness of breath. He reports that he has been urinating a lot since his recent  hospitalization in June, he is currently on Spironolactone and Lasix. He denies obstructive symptoms, hematuria, NSAID use.   He reports severe weakness last night to the extent of not being able to stand, squeeze his hands or lift his legs.    Trend in Creatinine: Creat  Date/Time Value Ref Range Status  10/09/2015 03:54 PM 1.19 0.70 - 1.25 mg/dL Final    Comment:      For patients > or = 68 years of age: The upper reference limit for Creatinine is approximately 13% higher for people identified as African-American.     09/12/2014 03:21 PM 0.99 0.50 - 1.35 mg/dL Final  09/06/2013 11:58 AM 0.91 0.50 - 1.35 mg/dL Final  12/01/2010 02:45 PM 0.85 0.50 - 1.35 mg/dL Final  11/26/2010 04:39 PM 2.41 (H) 0.50 - 1.35 mg/dL Final    Comment:    Result repeated and verified.  10/24/2010 09:32 AM 0.88 0.50 - 1.35 mg/dL Final   Creatinine, Ser  Date/Time Value Ref Range Status  02/15/2020 09:12 AM 1.23 0.76 - 1.27 mg/dL Final  10/09/2019 04:58 AM 1.48 (H) 0.61 - 1.24 mg/dL Final  10/06/2019 03:31 AM 1.10 0.61 - 1.24 mg/dL Final  10/05/2019 03:25 AM 0.99 0.61 - 1.24 mg/dL Final  10/04/2019 03:08 AM 1.01 0.61 - 1.24 mg/dL Final  10/03/2019 03:33 AM 0.97 0.61 - 1.24 mg/dL Final  10/02/2019 04:18 PM 1.03 0.61 - 1.24 mg/dL Final  10/02/2019 03:34 AM 1.07 0.61 - 1.24  mg/dL Final  10/01/2019 04:30 AM 1.13 0.61 - 1.24 mg/dL Final  09/30/2019 04:46 PM 1.17 0.61 - 1.24 mg/dL Final  09/30/2019 03:25 AM 1.32 (H) 0.61 - 1.24 mg/dL Final  09/29/2019 04:29 PM 1.57 (H) 0.61 - 1.24 mg/dL Final  09/29/2019 03:52 AM 1.68 (H) 0.61 - 1.24 mg/dL Final  09/28/2019 06:31 PM 1.67 (H) 0.61 - 1.24 mg/dL Final  09/28/2019 07:27 AM 1.79 (H) 0.61 - 1.24 mg/dL Final  09/27/2019 03:01 AM 2.12 (H) 0.61 - 1.24 mg/dL Final  09/26/2019 02:49 PM 2.08 (H) 0.61 - 1.24 mg/dL Final  02/21/2018 04:50 PM 1.13 0.76 - 1.27 mg/dL Final  03/25/2017 12:41 PM 1.16 0.76 - 1.27 mg/dL Final  05/22/2009 08:55 PM 0.76 0.40 - 1.50 mg/dL  Final  02/15/2009 09:29 PM 0.73 0.40 - 1.50 mg/dL Final  01/21/2009 06:17 AM 0.53 0.40 - 1.50 mg/dL Final  01/20/2009 06:35 AM 0.74 0.40 - 1.50 mg/dL Final  01/19/2009 04:00 AM 0.84 0.40 - 1.50 mg/dL Final  01/18/2009 06:50 AM 0.81 0.40 - 1.50 mg/dL Final  01/17/2009 07:51 AM 0.8 0.40 - 1.50 mg/dL Final    PMH:   Past Medical History:  Diagnosis Date  . Diabetes mellitus without complication ([place])   . HLD (hyperlipidemia)   . Hypertension     PSH:   Past Surgical History:  Procedure Laterality Date  . AMPUTATION Left 09/28/2019   Procedure: AMPUTATION BELOW KNEE;  Surgeon: Newt Minion, MD;  Location: [place];  Service: Orthopedics;  Laterality: Left;  . CARDIOVERSION N/A 10/05/2019   Procedure: CARDIOVERSION;  Surgeon: Sanda Klein, MD;  Location: [facility];  Service: Cardiovascular;  Laterality: N/A;  . LEFT HEART CATH AND CORONARY ANGIOGRAPHY N/A 10/03/2019   Procedure: LEFT HEART CATH AND CORONARY ANGIOGRAPHY;  Surgeon: Lorretta Harp, MD;  Location: [place] [facility];  Service: Cardiovascular;  Laterality: N/A;  . TEE WITHOUT CARDIOVERSION N/A 10/05/2019   Procedure: TRANSESOPHAGEAL ECHOCARDIOGRAM (TEE);  Surgeon: Sanda Klein, MD;  Location[facility];  Service: Cardiovascular;  Laterality: N/A;    Allergies: No Known Allergies  Medications:   Prior to Admission medications   Medication Sig Start Date End Date Taking? Authorizing Provider  amiodarone (PACERONE) 200 MG tablet Take 1 tablet (200 mg total) by mouth daily. 11/13/19   [name], MD  aspirin EC 81 MG tablet Take 1 tablet (81 mg total) by mouth daily. Swallow whole. 11/03/19   Geralynn Rile, MD  atorvastatin (LIPITOR) 80 MG tablet TAKE 1 TABLET EVERY DAY 03/25/20   O'Neal, Cassie Freer, MD  Blood Glucose Monitoring Suppl (TRUE METRIX METER) DEVI Use to test blood sugar three times daily. 11/20/19   [name], MD  Blood Glucose Monitoring Suppl (TRUE METRIX METER) w/Device KIT USE  AS DIRECTED 03/25/20   [name], MD  carvedilol (COREG) 12.5 MG tablet Take 1 tablet (12.5 mg total) by mouth 2 (two) times daily with a meal. 11/13/19   Hensel, Jamal Collin, MD  furosemide (LASIX) 20 MG tablet Take 1 tablet (20 mg total) by mouth daily. 11/13/19   [name], MD  glucose blood (RELION TRUE METRIX TEST STRIPS) test strip Use to test blood sugar three times per day. 11/20/19   [name], MD  insulin aspart protamine- aspart (NOVOLOG MIX 70/30) (70-30) 100 UNIT/ML injection Inject into the skin. 30 units q breakfast and 15 units q supper.  Only use until we can get you lantus    [provider]  losartan (COZAAR) 25 MG tablet Take 1 tablet (25 mg total) by mouth daily. Duplicate.  Losartan replaces lisinopril.  Please discontinue the lisinopril 11/13/19   [name], MD  metFORMIN (GLUCOPHAGE) 1000 MG tablet TAKE 1 TABLET TWICE DAILY WITH A MEAL 11/22/19   Hensel, Jamal Collin, MD  spironolactone (ALDACTONE) 25 MG tablet Take 12.5 mg by mouth daily.    [provider]  TRUEplus Lancets 33G MISC Use to test blood sugar three times per day. 11/20/19   [name], MD  warfarin (COUMADIN) 5 MG tablet Take 1 tablet (5 mg total) by mouth daily. 01/05/20   Pixie Casino, MD    Inpatient medications: . albuterol  10 mg Nebulization Once  . calcium gluconate  1 g Intravenous Once  . insulin aspart  5 Units Intravenous Once   And  . dextrose  1 ampule Intravenous Once  . sodium bicarbonate  50 mEq Intravenous Once    Discontinued Meds:   Medications Discontinued During This Encounter  Medication Reason  . sodium bicarbonate 100 mEq in dextrose 5 % 1,000 mL infusion     Social History:  reports that he has been smoking cigarettes. He has been smoking about 0.50 packs per day. He has never used smokeless tobacco. He reports current alcohol use of about 6.0 standard drinks of alcohol per week. He reports that he does not use drugs.  Family  History:   Family History  Problem Relation Age of Onset  . Alcoholism Mother   . Alcoholism Father     Pertinent items are noted in HPI. Weight change:  No intake or output data in the 24 hours ending 04/03/20 1101 BP (!) 107/59   Pulse (!) 51   Temp (!) 97.2 F (36.2 C) (Oral)   Resp (!) 23   Ht 6' (1.829 m)   Wt 115.7 kg   SpO2 (!) 3%   BMI 34.58 kg/m  Vitals:   04/03/20 1024 04/03/20 1046 04/03/20 1049 04/03/20 1052  BP: (!) 83/62   (!) 107/59  Pulse: (!) 51   (!) 51  Resp:    (!) 23  Temp: (!) 97.2 F (36.2 C)     TempSrc: Oral     SpO2: 96% 97%  (!) 3%  Weight:   115.7 kg   Height:   6' (1.829 m)      General appearance: alert, cooperative and appears stated age Head: Normocephalic, without obvious abnormality, atraumatic Eyes: negative Neck: no adenopathy, no carotid bruit, supple, symmetrical, trachea midline and thyroid not enlarged, symmetric, no tenderness/mass/nodules Back: symmetric, no curvature. ROM normal. No CVA tenderness. Resp: rales bibasilar Cardio: bradycardic GI: obese, decr BS Extremities: edema tr to 1+ ankle edema Pulses: 2+ and symmetric Skin: Skin color, texture, turgor normal. No rashes or lesions  Labs: Basic Metabolic Panel: No results for input(s): NA, K, CL, CO2, GLUCOSE, BUN, CREATININE, ALBUMIN, CALCIUM, PHOS in the last 168 hours. Liver Function Tests: No results for input(s): AST, ALT, ALKPHOS, BILITOT, PROT, ALBUMIN in the last 168 hours. No results for input(s): LIPASE, AMYLASE in the last 168 hours. No results for input(s): AMMONIA in the last 168 hours. CBC: Recent Labs  Lab 04/03/20 0956  WBC 13.8*  NEUTROABS 12.1*  HGB 10.9*  HCT 33.4*  MCV 94.9  PLT 322   PT/INR: @LABRCNTIP (inr:5) Cardiac Enzymes: )No results for input(s): CKTOTAL, CKMB, CKMBINDEX, TROPONINI in the last 168 hours. CBG: No results for input(s): GLUCAP in the last 168 hours.  Iron Studies: No results for input(s): IRON, TIBC, TRANSFERRIN,  FERRITIN in the last 168 hours.  Xrays/Other Studies: DG Chest Port 1 View  Result Date: 04/03/2020 CLINICAL DATA:  Shortness of breath. EXAM: PORTABLE CHEST 1 VIEW COMPARISON:  October 02, 2019. FINDINGS: Right hilar prominence is concerning for possible mass or adenopathy. No pneumothorax pleural effusion is noted. Right lung is clear. Stable pleural thickening is noted in the left lung laterally. Mild left basilar atelectasis or scarring possibly inflammation is noted. Bony thorax is unremarkable. IMPRESSION: Right hilar prominence is noted concerning for possible mass or adenopathy. CT scan of the chest with intravenous contrast is recommended for further evaluation. Mild left basilar atelectasis or scarring is noted. Electronically Signed   By: Marijo Conception M.D.   On: 04/03/2020 10:31     Marissa Ulyess Blossom 04/03/2020, 11:01 AM

## 2020-04-03 NOTE — Procedures (Signed)
Central Venous Catheter Insertion Procedure Note  Rodney [name]  [id number]  1951/04/14  Date:04/03/20  Time:2:12 PM   Provider Performing:Pete Johnette Abraham Kary Kos   Procedure: Insertion of Non-tunneled Central Venous Catheter(36556)with US guidance (22482)    Indication(s) Hemodialysis  Consent Risks of the procedure as well as the alternatives and risks of each were explained to the patient and/or caregiver.  Consent for the procedure was obtained and is signed in the bedside chart  Anesthesia Topical only with 1% lidocaine   Timeout Verified patient identification, verified procedure, site/side was marked, verified correct patient position, special equipment/implants available, medications/allergies/relevant history reviewed, required imaging and test results available.  Sterile Technique Maximal sterile technique including full sterile barrier drape, hand hygiene, sterile gown, sterile gloves, mask, hair covering, sterile ultrasound probe cover (if used).  Procedure Description Area of catheter insertion was cleaned with chlorhexidine and draped in sterile fashion.   With real-time ultrasound guidance a HD catheter was placed into the left internal jugular vein.  Nonpulsatile blood flow and easy flushing noted in all ports.  The catheter was sutured in place and sterile dressing applied.  Complications/Tolerance None; patient tolerated the procedure well. Chest X-ray is ordered to verify placement for internal jugular or subclavian cannulation.  Chest x-ray is not ordered for femoral cannulation.  EBL Minimal  Specimen(s) None  Erick Colace ACNP-BC [place] Pager # [phone] OR # [phone] if no answer

## 2020-04-03 NOTE — H&P (Addendum)
NAME:  Rodney [name], MRN:  [id number], DOB:  1951/03/25, LOS: 0 ADMISSION DATE:  04/03/2020, CONSULTATION DATE:  12/8 REFERRING MD:  Gilford Raid, CHIEF COMPLAINT:  Nausea/vomiting/weakness w/ bradycardia and hyperkalemia   Brief History   68 year old male with multiple comorbidities including heart failure with reduced ejection fraction, CKD stage III, peripheral vascular disease diabetes.  Presented with symptomatic bradycardia, nausea, vomiting, and acute on chronic renal failure with associated hyperkalemia  History of present illness   69 year old male patient with comorbidities as mentioned below presented to the emergency room on 12/8 with chief complaint of Weakness, shortness of breath, and several episodes of nausea and vomiting.  He was transported by emergency response, in ER found to be bradycardic with heart rate in the 50s, with peak T waves, new acute on chronic renal failure with BUN 103 and creatinine of 10.39 and potassium greater than 7.5.  Of note at baseline his home medications include amiodarone, losartan, carvedilol, aspirin, warfarin and Lasix.  In addition to spironolactone.  Therapeutic interventions in the emergency room included inhaled albuterol, calcium gluconate, insulin and D50, as well as sodium bicarb.  He is placed on supplemental oxygen, nephrology was consulted, and given his EKG changes critical care was asked to admit  Past Medical History    heart failure with reduced ejection fraction EF 35 to 40%, aortic stenosis, chronic atrial fibrillation on Coumadin, coronary artery disease, stage III CKD, obesity, tobacco abuse, left low/mid knee amputation June 2021 [facility] Events   12/8 presented to the emergency room hyperkalemic with symptomatic bradycardia Consults:  Nephrology consulted  Procedures:    Significant Diagnostic Tests:  Renal ultrasound 12/8  Micro Data:  Respiratory panel by PCR 12/8>>>  Antimicrobials:   Interim  history/subjective:  Feels a little better  Objective   Blood pressure (Abnormal) 107/59, pulse (Abnormal) 56, temperature (Abnormal) 97.2 F (36.2 C), temperature source Oral, resp. rate (Abnormal) 25, height 6' (1.829 m), weight 115.7 kg, SpO2 95 %.       No intake or output data in the 24 hours ending 04/03/20 1136 Filed Weights   04/03/20 1049  Weight: 115.7 kg    Examination: General: obese 69 year old wm. Resting in bed.  HENT: MM dry neck large. Sclera not icteric tongue is dry and cracked Lungs: crackles both bases  Cardiovascular: regular and bradycardic  Abdomen: soft  Extremities: cool edematous RLE pulses are weak prior left BKA Neuro: awake and oriented GU: due to void   Resolved Hospital Problem list     Assessment & Plan:  Acute on chronic renal failure (ckd stage III), with life-threatening hyperkalemia and anion gap metabolic acidosis ; baseline serum creatinine 1.23 currently 10.39 -Has had several episodes of nausea and vomiting after several days of progressive weakness, looks dry but LE edema present ->difficult to assess volume status.  -Seen by nephrology Plan For emergent hemodialysis Place dialysis catheter Hold diuretics Hold ARB and antihypertensives Mean arterial pressure goal greater than 65 Renal dose all medications Check renal ultrasound for completeness Additional work-up per nephrology IVFs w/  bicarb gtt initiated   Lactic acidosis Plan Repeat lactate  Hold metformin   Symptomatic bradycardia with peaked T waves in the setting of hyperkalemia Plan Treat hyperkalemia Continue telemetry monitoring Holding amiodarone  Dyspnea  pcxr personally reviewed. Rotated. Some vascular congestion. Right sided medistinal fullness raising concern for mass vs adenopathy, basilar atx Plan Wean oxygen Repeat pcxr in am Pulse ox Consider CT chest at some point if  cxr does not improve  Chronic HFrEF (EF 30-35%) Aortic stenosis  -mild  elevated BnP Plan Tele  Holding amiodarone , losartan, carvedilol, aspirin,  CAF Plan Tele  Hold amiodarone  Heparin per pharmacy then back to coumadin   DM (insulin dependent) Plan ssi   Mild anemia Plan Trend cbc  Best practice (evaluated daily)   Diet: NPO  Pain/Anxiety/Delirium protocol (if indicated): NA VAP protocol (if indicated): NA DVT prophylaxis: iv heparin  GI prophylaxis: PPI Glucose control: ssi  Mobility: BR last date of multidisciplinary goals of care discussion pending  Family and staff present no family currently  Summary of discussion to ICU for emergent HD  Follow up goals of care discussion due 12/15 Code Status: full code  Disposition:ICU for emergent HD   Labs   CBC: Recent Labs  Lab 04/03/20 0956  WBC 13.8*  NEUTROABS 12.1*  HGB 10.9*  HCT 33.4*  MCV 94.9  PLT 283    Basic Metabolic Panel: Recent Labs  Lab 04/03/20 0956  NA 133*  K >7.5*  CL 94*  CO2 16*  GLUCOSE 131*  BUN 103*  CREATININE 10.39*  CALCIUM 8.8*   GFR: Estimated Creatinine Clearance: 8.9 mL/min (A) (by C-G formula based on SCr of 10.39 mg/dL (H)). Recent Labs  Lab 04/03/20 0956 04/03/20 0957  WBC 13.8*  --   LATICACIDVEN  --  8.3*    Liver Function Tests: No results for input(s): AST, ALT, ALKPHOS, BILITOT, PROT, ALBUMIN in the last 168 hours. No results for input(s): LIPASE, AMYLASE in the last 168 hours. No results for input(s): AMMONIA in the last 168 hours.  ABG    Component Value Date/Time   TCO2 25 01/17/2009 0751     Coagulation Profile: Recent Labs  Lab 04/03/20 0956  INR 1.9*    Cardiac Enzymes: No results for input(s): CKTOTAL, CKMB, CKMBINDEX, TROPONINI in the last 168 hours.  HbA1C: HbA1c, POC (controlled diabetic range)  Date/Time Value Ref Range Status  09/21/2019 02:51 PM 8.7 (A) 0.0 - 7.0 % Final  06/22/2018 04:00 PM 9.0 (A) 0.0 - 7.0 % Final   Hgb A1c MFr Bld  Date/Time Value Ref Range Status  09/26/2019 09:34 PM  9.2 (H) 4.8 - 5.6 % Final    Comment:    (NOTE) Pre diabetes:          5.7%-6.4% Diabetes:              >6.4% Glycemic control for   <7.0% adults with diabetes     CBG: No results for input(s): GLUCAP in the last 168 hours.  Review of Systems:   Review of Systems  Constitutional: Positive for malaise/fatigue. Negative for chills and fever.  HENT: Negative.   Eyes: Negative.   Respiratory: Positive for shortness of breath.   Cardiovascular: Positive for chest pain and leg swelling. Negative for palpitations.  Gastrointestinal: Positive for nausea and vomiting.  Genitourinary: Positive for frequency.  Musculoskeletal: Negative.   Skin: Negative.   Neurological: Negative.   Endo/Heme/Allergies: Negative.   Psychiatric/Behavioral: Negative.      Past Medical History  He,  has a past medical history of Diabetes mellitus without complication ([place]), HLD (hyperlipidemia), and Hypertension.   Surgical History    Past Surgical History:  Procedure Laterality Date  . AMPUTATION Left 09/28/2019   Procedure: AMPUTATION BELOW KNEE;  Surgeon: Newt Minion, MD;  Location: [place];  Service: Orthopedics;  Laterality: Left;  . CARDIOVERSION N/A 10/05/2019   Procedure: CARDIOVERSION;  Surgeon:  Croitoru, Dani Gobble, MD;  Location: [place];  Service: Cardiovascular;  Laterality: N/A;  . LEFT HEART CATH AND CORONARY ANGIOGRAPHY N/A 10/03/2019   Procedure: LEFT HEART CATH AND CORONARY ANGIOGRAPHY;  Surgeon: Lorretta Harp, MD;  Location: [place] CV LAB;  Service: Cardiovascular;  Laterality: N/A;  . TEE WITHOUT CARDIOVERSION N/A 10/05/2019   Procedure: TRANSESOPHAGEAL ECHOCARDIOGRAM (TEE);  Surgeon: Sanda Klein, MD;  Location[facility] ENDOSCOPY;  Service: Cardiovascular;  Laterality: N/A;     Social History   reports that he has been smoking cigarettes. He has been smoking about 0.50 packs per day. He has never used smokeless tobacco. He reports current alcohol use of about 6.0 standard  drinks of alcohol per week. He reports that he does not use drugs.   Family History   His family history includes Alcoholism in his father and mother.   Allergies No Known Allergies   Home Medications  Prior to Admission medications   Medication Sig Start Date End Date Taking? Authorizing Provider  amiodarone (PACERONE) 200 MG tablet Take 1 tablet (200 mg total) by mouth daily. 11/13/19   [name], MD  aspirin EC 81 MG tablet Take 1 tablet (81 mg total) by mouth daily. Swallow whole. 11/03/19   Geralynn Rile, MD  atorvastatin (LIPITOR) 80 MG tablet TAKE 1 TABLET EVERY DAY 03/25/20   O'Neal, Cassie Freer, MD  Blood Glucose Monitoring Suppl (TRUE METRIX METER) DEVI Use to test blood sugar three times daily. 11/20/19   [name], MD  Blood Glucose Monitoring Suppl (TRUE METRIX METER) w/Device KIT USE AS DIRECTED 03/25/20   [name], MD  carvedilol (COREG) 12.5 MG tablet Take 1 tablet (12.5 mg total) by mouth 2 (two) times daily with a meal. 11/13/19   Hensel, Jamal Collin, MD  furosemide (LASIX) 20 MG tablet Take 1 tablet (20 mg total) by mouth daily. 11/13/19   [name], MD  glucose blood (RELION TRUE METRIX TEST STRIPS) test strip Use to test blood sugar three times per day. 11/20/19   [name], MD  insulin aspart protamine- aspart (NOVOLOG MIX 70/30) (70-30) 100 UNIT/ML injection Inject into the skin. 30 units q breakfast and 15 units q supper.  Only use until we can get you lantus    [provider]  losartan (COZAAR) 25 MG tablet Take 1 tablet (25 mg total) by mouth daily. Duplicate.  Losartan replaces lisinopril.  Please discontinue the lisinopril 11/13/19   [name], MD  metFORMIN (GLUCOPHAGE) 1000 MG tablet TAKE 1 TABLET TWICE DAILY WITH A MEAL 11/22/19   Hensel, Jamal Collin, MD  spironolactone (ALDACTONE) 25 MG tablet Take 12.5 mg by mouth daily.    [provider]  TRUEplus Lancets 33G MISC Use to test blood sugar three  times per day. 11/20/19   [name], MD  warfarin (COUMADIN) 5 MG tablet Take 1 tablet (5 mg total) by mouth daily. 01/05/20   Pixie Casino, MD     Critical care time: my cct 76 min     Erick Colace ACNP-BC [place] Pager # [phone] OR # [phone] if no answer

## 2020-04-03 NOTE — ED Notes (Signed)
Date and time results received: 04/03/20  (use smartphrase ".now" to insert current time)  Test: lactic acid  Critical Value: 8.3  Name of Provider Notified: Dr. Gilford Raid  Orders Received? Or Actions Taken?:

## 2020-04-04 DIAGNOSIS — N179 Acute kidney failure, unspecified: Principal | ICD-10-CM

## 2020-04-04 LAB — CBC
HCT: 31 % — ABNORMAL LOW (ref 39.0–52.0)
Hemoglobin: 10.2 g/dL — ABNORMAL LOW (ref 13.0–17.0)
MCH: 30 pg (ref 26.0–34.0)
MCHC: 32.9 g/dL (ref 30.0–36.0)
MCV: 91.2 fL (ref 80.0–100.0)
Platelets: 277 10*3/uL (ref 150–400)
RBC: 3.4 MIL/uL — ABNORMAL LOW (ref 4.22–5.81)
RDW: 13.5 % (ref 11.5–15.5)
WBC: 14 10*3/uL — ABNORMAL HIGH (ref 4.0–10.5)
nRBC: 0 % (ref 0.0–0.2)

## 2020-04-04 LAB — GLUCOSE, CAPILLARY
Glucose-Capillary: 144 mg/dL — ABNORMAL HIGH (ref 70–99)
Glucose-Capillary: 145 mg/dL — ABNORMAL HIGH (ref 70–99)
Glucose-Capillary: 174 mg/dL — ABNORMAL HIGH (ref 70–99)
Glucose-Capillary: 123 mg/dL — ABNORMAL HIGH (ref 70–99)
Glucose-Capillary: 158 mg/dL — ABNORMAL HIGH (ref 70–99)
Glucose-Capillary: 184 mg/dL — ABNORMAL HIGH (ref 70–99)

## 2020-04-04 LAB — BASIC METABOLIC PANEL
Anion gap: 17 — ABNORMAL HIGH (ref 5–15)
Anion gap: 13 (ref 5–15)
BUN: 69 mg/dL — ABNORMAL HIGH (ref 8–23)
BUN: 66 mg/dL — ABNORMAL HIGH (ref 8–23)
CO2: 26 mmol/L (ref 22–32)
CO2: 29 mmol/L (ref 22–32)
Calcium: 8.3 mg/dL — ABNORMAL LOW (ref 8.9–10.3)
Calcium: 7.9 mg/dL — ABNORMAL LOW (ref 8.9–10.3)
Chloride: 93 mmol/L — ABNORMAL LOW (ref 98–111)
Chloride: 92 mmol/L — ABNORMAL LOW (ref 98–111)
Creatinine, Ser: 6.78 mg/dL — ABNORMAL HIGH (ref 0.61–1.24)
Creatinine, Ser: 6.6 mg/dL — ABNORMAL HIGH (ref 0.61–1.24)
GFR, Estimated: 8 mL/min — ABNORMAL LOW (ref >60)
GFR, Estimated: 9 mL/min — ABNORMAL LOW (ref >60)
Glucose, Bld: 137 mg/dL — ABNORMAL HIGH (ref 70–99)
Glucose, Bld: 178 mg/dL — ABNORMAL HIGH (ref 70–99)
Potassium: 6 mmol/L — ABNORMAL HIGH (ref 3.5–5.1)
Potassium: 4.9 mmol/L (ref 3.5–5.1)
Sodium: 136 mmol/L (ref 135–145)
Sodium: 134 mmol/L — ABNORMAL LOW (ref 135–145)

## 2020-04-04 LAB — BLOOD GAS, ARTERIAL
Acid-Base Excess: 3.2 mmol/L — ABNORMAL HIGH (ref 0.0–2.0)
Bicarbonate: 27.2 mmol/L (ref 20.0–28.0)
Drawn by: 60057
FIO2: 28
O2 Saturation: 95.3 %
Patient temperature: 37
pCO2 arterial: 41.6 mmHg (ref 32.0–48.0)
pH, Arterial: 7.431 (ref 7.350–7.450)
pO2, Arterial: 77.9 mmHg — ABNORMAL LOW (ref 83.0–108.0)

## 2020-04-04 LAB — BRAIN NATRIURETIC PEPTIDE: B Natriuretic Peptide: 860.6 pg/mL — ABNORMAL HIGH (ref 0.0–100.0)

## 2020-04-04 LAB — HEPATITIS B SURFACE ANTIGEN: Hepatitis B Surface Ag: NONREACTIVE

## 2020-04-04 LAB — PHOSPHORUS: Phosphorus: 5 mg/dL — ABNORMAL HIGH (ref 2.5–4.6)

## 2020-04-04 LAB — MAGNESIUM: Magnesium: 1.6 mg/dL — ABNORMAL LOW (ref 1.7–2.4)

## 2020-04-04 LAB — HEPATITIS C ANTIBODY: HCV Ab: NONREACTIVE

## 2020-04-04 LAB — HEPATITIS B E ANTIBODY: Hep B E Ab: NEGATIVE

## 2020-04-04 MED ORDER — HEPARIN SODIUM (PORCINE) 1000 UNIT/ML IJ SOLN
INTRAMUSCULAR | Status: AC
  Administered 2020-04-04: 3400 [IU]
  Filled 2020-04-04: qty 4

## 2020-04-04 MED ORDER — CARVEDILOL 12.5 MG PO TABS
12.5000 mg | ORAL_TABLET | Freq: Two times a day (BID) | ORAL | Status: DC
  Administered 2020-04-04 – 2020-04-09 (×10): 12.5 mg via ORAL
  Filled 2020-04-04 (×10): qty 1

## 2020-04-04 MED ORDER — AMIODARONE HCL 200 MG PO TABS
200.0000 mg | ORAL_TABLET | Freq: Every day | ORAL | Status: DC
  Administered 2020-04-04 – 2020-04-09 (×6): 200 mg via ORAL
  Filled 2020-04-04 (×6): qty 1

## 2020-04-04 MED ORDER — SODIUM ZIRCONIUM CYCLOSILICATE 10 G PO PACK
10.0000 g | PACK | Freq: Three times a day (TID) | ORAL | Status: DC
  Administered 2020-04-04 – 2020-04-05 (×4): 10 g via ORAL
  Filled 2020-04-04 (×4): qty 1

## 2020-04-04 MED ORDER — WARFARIN - PHARMACIST DOSING INPATIENT: Freq: Every day | Status: DC

## 2020-04-04 MED ORDER — SODIUM ZIRCONIUM CYCLOSILICATE 10 G PO PACK
10.0000 g | PACK | Freq: Once | ORAL | Status: AC
  Administered 2020-04-04: 10 g via ORAL
  Filled 2020-04-04: qty 1

## 2020-04-04 MED ORDER — MAGNESIUM SULFATE 4 GM/100ML IV SOLN
4.0000 g | Freq: Once | INTRAVENOUS | Status: AC
  Administered 2020-04-04: 4 g via INTRAVENOUS
  Filled 2020-04-04: qty 100

## 2020-04-04 MED ORDER — WARFARIN SODIUM 7.5 MG PO TABS
7.5000 mg | ORAL_TABLET | Freq: Once | ORAL | Status: AC
  Administered 2020-04-04: 7.5 mg via ORAL
  Filled 2020-04-04: qty 1

## 2020-04-04 NOTE — H&P (Signed)
NAME:  Rodney [name], MRN:  [id number], DOB:  13-Nov-1950, LOS: 1 ADMISSION DATE:  04/03/2020, CONSULTATION DATE:  12/8 REFERRING MD:  Gilford Raid, CHIEF COMPLAINT:  Nausea/vomiting/weakness w/ bradycardia and hyperkalemia   Brief History   69 year old male with multiple comorbidities including heart failure with reduced ejection fraction, CKD stage III, peripheral vascular disease diabetes.  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  He received Lokelma  Objective   Blood pressure 130/71, pulse 84, temperature 98.3 F (36.8 C), temperature source Oral, resp. rate (!) 25, height 6' (1.829 m), weight 103.1 kg, SpO2 97 %.        Intake/Output Summary (Last 24 hours) at 04/04/2020 1003 Last data filed at 04/04/2020 0800 Gross per 24 hour  Intake 1507.35 ml  Output 3000 ml  Net -1492.65 ml   Filed Weights   04/03/20 1445 04/03/20 1746 04/04/20 0347  Weight: 115.7 kg 113.7 kg 103.1 kg    Examination: General: Middle-aged morbidly obese male, lying on the bed HENT: Traumatic, normocephalic, moist mucous membranes Lungs: Reduced air entry at the bases bilaterally Cardiovascular: Irregularly irregular, no murmur Abdomen: soft nontender, nondistended Extremities: cool  edematous RLE pulses are weak prior left BKA Neuro: Alert, awake and oriented x3 Skin: No rash  Resolved Hospital Problem list     Assessment & Plan:  Acute on chronic renal failure (ckd stage IIIa) Refractory hyperkalemia with EKG changes, improved High anion gap metabolic acidosis/lactic acidosis  Patient's baseline serum creatinine is 1.23, he presented with serum creatinine of 10.39 Also he was hyperkalemic with EKG changes, it was refractory to treatment Nephrology input is appreciated Patient received his first hemodialysis session yesterday with improvement in serum potassium and EKG changes Continue Lokelma Patient received multiple albuterol treatment for hyperkalemia treatment that led to lactic acidosis, he was on Metformin which was kept on hold so lactic acidosis could be due to Metformin therapy Discontinue bicarbonate infusion Holding Metformin Avoid nephrotoxic agent  Chronic HFrEF (EF 30-35%) Aortic stenosis  Holding losartan spironolactone Resume Coreg and aspirin  Chronic A. fib Patient's heart rate remains well controlled Resume amiodarone and Coreg Restarted back on Coumadin for stroke prophylaxis  Poorly controlled diabetes Hold Metformin Continue Lantus and sliding scale insulin  Morbid obesity Nutritionist consult is requested Best practice (evaluated daily)   Diet: Renal diet Pain/Anxiety/Delirium protocol (if indicated): NA VAP protocol (if indicated): NA DVT prophylaxis: Coumadin GI prophylaxis: PPI Glucose control: ssi  Mobility: As tolerated Code Status: full code  Disposition: Medical telemetry  Labs   CBC: Recent Labs  Lab 04/03/20 0956 04/03/20 1156 04/03/20 1225 04/04/20 0128  WBC 13.8* 11.3*  --  14.0*  NEUTROABS 12.1*  --   --   --   HGB 10.9* 10.3* 9.5* 10.2*  HCT 33.4* 32.6* 28.0* 31.0*  MCV 94.9 96.7  --  91.2  PLT 322 268  --  277  Basic Metabolic Panel: Recent Labs  Lab 04/03/20 0956 04/03/20 1156  04/03/20 1225 04/03/20 1932 04/04/20 0128  NA 133* 136 132* 132* 136  K >7.5* 7.1* 6.7* 5.5* 6.0*  CL 94* 95*  --  91* 93*  CO2 16* 17*  --  22 26  GLUCOSE 131* 218*  --  136* 137*  BUN 103* 117*  --  57* 69*  CREATININE 10.39* 10.45*  --  6.28* 6.78*  CALCIUM 8.8* 9.1  --  8.2* 8.3*  MG  --  1.6*  --   --  1.6*  PHOS  --  8.2*  --   --  5.0*   GFR: Estimated Creatinine Clearance: 12.9 mL/min (A) (by C-G formula based on SCr of 6.78 mg/dL (H)). Recent Labs  Lab 04/03/20 0956 04/03/20 0957 04/03/20 1154 04/03/20 1156 04/03/20 1740 04/03/20 2134 04/04/20 0128  PROCALCITON  --   --   --  0.29  --   --   --   WBC 13.8*  --   --  11.3*  --   --  14.0*  LATICACIDVEN  --  8.3* 8.5*  --  5.0* 5.8*  --     Liver Function Tests: Recent Labs  Lab 04/03/20 1156  AST 27  ALT 22  ALKPHOS 52  BILITOT 0.8  PROT 6.0*  ALBUMIN 3.0*   Recent Labs  Lab 04/03/20 1156  LIPASE 23  AMYLASE 127*   No results for input(s): AMMONIA in the last 168 hours.  ABG    Component Value Date/Time   PHART 7.431 04/04/2020 0607   PCO2ART 41.6 04/04/2020 0607   PO2ART 77.9 (L) 04/04/2020 0607   HCO3 27.2 04/04/2020 0607   TCO2 18 (L) 04/03/2020 1225   ACIDBASEDEF 10.0 (H) 04/03/2020 1225   O2SAT 95.3 04/04/2020 0607     Coagulation Profile: Recent Labs  Lab 04/03/20 0956 04/03/20 1156  INR 1.9* 2.0*    Cardiac Enzymes: No results for input(s): CKTOTAL, CKMB, CKMBINDEX, TROPONINI in the last 168 hours.  HbA1C: HbA1c, POC (controlled diabetic range)  Date/Time Value Ref Range Status  09/21/2019 02:51 PM 8.7 (A) 0.0 - 7.0 % Final  06/22/2018 04:00 PM 9.0 (A) 0.0 - 7.0 % Final   Hgb A1c MFr Bld  Date/Time Value Ref Range Status  04/03/2020 06:30 PM 7.5 (H) 4.8 - 5.6 % Final    Comment:    (NOTE) Pre diabetes:          5.7%-6.4%  Diabetes:              >6.4%  Glycemic control for   <7.0% adults with diabetes   09/26/2019 09:34 PM 9.2 (H) 4.8 - 5.6 % Final    Comment:     (NOTE) Pre diabetes:          5.7%-6.4% Diabetes:              >6.4% Glycemic control for   <7.0% adults with diabetes     CBG: Recent Labs  Lab 04/03/20 1621 04/03/20 1914 04/03/20 2350 04/04/20 0332 04/04/20 0717  GLUCAP 148* 124* 139* 144* 145*    Review of Systems:   Review of Systems  Constitutional: Positive for malaise/fatigue. Negative for chills and fever.  HENT: Negative.   Eyes: Negative.   Respiratory: Positive for shortness of breath.   Cardiovascular: Positive for chest pain and leg swelling. Negative for palpitations.  Gastrointestinal: Positive for nausea and vomiting.  Genitourinary: Positive for frequency.  Musculoskeletal: Negative.  Skin: Negative.   Neurological: Negative.   Endo/Heme/Allergies: Negative.   Psychiatric/Behavioral: Negative.      Past Medical History  He,  has a past medical history of Diabetes mellitus without complication ([place]), HLD (hyperlipidemia), and Hypertension.   Surgical History    Past Surgical History:  Procedure Laterality Date  . AMPUTATION Left 09/28/2019   Procedure: AMPUTATION BELOW KNEE;  Surgeon: Newt Minion, MD;  Location: [facility];  Service: Orthopedics;  Laterality: Left;  . CARDIOVERSION N/A 10/05/2019   Procedure: CARDIOVERSION;  Surgeon: Sanda Klein, MD;  Location: [facility];  Service: Cardiovascular;  Laterality: N/A;  . LEFT HEART CATH AND CORONARY ANGIOGRAPHY N/A 10/03/2019   Procedure: LEFT HEART CATH AND CORONARY ANGIOGRAPHY;  Surgeon: Lorretta Harp, MD;  Location: [facility];  Service: Cardiovascular;  Laterality: N/A;  . TEE WITHOUT CARDIOVERSION N/A 10/05/2019   Procedure: TRANSESOPHAGEAL ECHOCARDIOGRAM (TEE);  Surgeon: Sanda Klein, MD;  Location: [facility];  Service: Cardiovascular;  Laterality: N/A;     Social History   reports that he has been smoking cigarettes. He has been smoking about 0.50 packs per day. He has never used smokeless tobacco. He reports current  alcohol use of about 6.0 standard drinks of alcohol per week. He reports that he does not use drugs.   Family History   His family history includes Alcoholism in his father and mother.   Allergies No Known Allergies   Home Medications  Prior to Admission medications   Medication Sig Start Date End Date Taking? Authorizing Provider  amiodarone (PACERONE) 200 MG tablet Take 1 tablet (200 mg total) by mouth daily. 11/13/19   [name], MD  aspirin EC 81 MG tablet Take 1 tablet (81 mg total) by mouth daily. Swallow whole. 11/03/19   Geralynn Rile, MD  atorvastatin (LIPITOR) 80 MG tablet TAKE 1 TABLET EVERY DAY 03/25/20   O'Neal, Cassie Freer, MD  Blood Glucose Monitoring Suppl (TRUE METRIX METER) DEVI Use to test blood sugar three times daily. 11/20/19   [name], MD  Blood Glucose Monitoring Suppl (TRUE METRIX METER) w/Device KIT USE AS DIRECTED 03/25/20   [name], MD  carvedilol (COREG) 12.5 MG tablet Take 1 tablet (12.5 mg total) by mouth 2 (two) times daily with a meal. 11/13/19   Hensel, Jamal Collin, MD  furosemide (LASIX) 20 MG tablet Take 1 tablet (20 mg total) by mouth daily. 11/13/19   [name], MD  glucose blood (RELION TRUE METRIX TEST STRIPS) test strip Use to test blood sugar three times per day. 11/20/19   [name], MD  insulin aspart protamine- aspart (NOVOLOG MIX 70/30) (70-30) 100 UNIT/ML injection Inject into the skin. 30 units q breakfast and 15 units q supper.  Only use until we can get you lantus    [provider]  losartan (COZAAR) 25 MG tablet Take 1 tablet (25 mg total) by mouth daily. Duplicate.  Losartan replaces lisinopril.  Please discontinue the lisinopril 11/13/19   [name], MD  metFORMIN (GLUCOPHAGE) 1000 MG tablet TAKE 1 TABLET TWICE DAILY WITH A MEAL 11/22/19   Hensel, Jamal Collin, MD  spironolactone (ALDACTONE) 25 MG tablet Take 12.5 mg by mouth daily.    [provider]  TRUEplus Lancets 33G  MISC Use to test blood sugar three times per day. 11/20/19   [name], MD  warfarin (COUMADIN) 5 MG tablet Take 1 tablet (5 mg total) by mouth daily. 01/05/20  Pixie Casino, MD      Jacky Kindle MD [facility] Pager: [phone] Mobile: [phone]

## 2020-04-04 NOTE — Progress Notes (Signed)
Received page that patient has been transferred to floor.    Sign out received from Dr. Tacy Learn.  In brief, patient is a 69 yo male with PMH HFrEF, CKD III, and Atrial fibrillation on coumadin, who presented in acute on chronic renal failure with severe hyperkalemia with EKG changes.  Cr was 10 at time of admission.  He was started on HD and received his first session yesterday.  He plans to have second session today.  He is overall doing well, comfortable, and stable.  He has been transferred to the floor.  FPTS appreciates the care provided by the ICU and will assume care on 12/10 at 0700.  [place] Constable, D.O.  PGY-3 Family Medicine  04/04/2020 2:02 PM

## 2020-04-04 NOTE — Progress Notes (Signed)
   04/04/20 1630  Vitals  Temp 98.4 F (36.9 C)  Temp Source Oral  BP 134/70  MAP (mmHg) 89  BP Location Left Arm  BP Method Automatic  Patient Position (if appropriate) Lying  Pulse Rate 83  Pulse Rate Source Monitor  ECG Heart Rate 83  Resp (!) 22  Oxygen Therapy  SpO2 93 %  O2 Device Room Air  O2 Flow Rate (L/min) 2 L/min  Post-Hemodialysis Assessment  Rinseback Volume (mL) 250 mL  KECN 215 V  Dialyzer Clearance Lightly streaked  Duration of HD Treatment -hour(s) 3 hour(s)  Hemodialysis Intake (mL) 500 mL  UF Total -Machine (mL) 2000 mL  Net UF (mL) 1500 mL  Tolerated HD Treatment Yes  Post-Hemodialysis Comments tx complete-pt stable  Hemodialysis Catheter  Placement Date/Time: 04/03/20 1526    Site Condition No complications  Blue Lumen Status Flushed;Capped (Central line);Heparin locked  Red Lumen Status Flushed;Capped (Central line);Heparin locked  Catheter fill solution Heparin 1000 units/ml  Catheter fill volume (Arterial) 1.7 cc  Catheter fill volume (Venous) 1.7  Dressing Type Occlusive  Dressing Status Old drainage  Antimicrobial disc in place? Yes  Drainage Description Other (Comment) (dried blood)  Dressing Change Due 04/05/20  Post treatment catheter status Capped and Clamped

## 2020-04-04 NOTE — Progress Notes (Signed)
Pts K trending up to 6.0 on AM lab work. Called and spoke to on call Renal MD. Per verbal order from Dr Joylene Grapes pt to get Lokelma 10 g once. No further orders at this time.

## 2020-04-04 NOTE — Progress Notes (Signed)
ANTICOAGULATION CONSULT NOTE - Initial Consult  Pharmacy Consult for warfarin Indication: atrial fibrillation  No Known Allergies  Patient Measurements: Height: 6' (182.9 cm) Weight: 103.1 kg (227 lb 4.7 oz) IBW/kg (Calculated) : 77.6  Vital Signs: Temp: 98.3 F (36.8 C) (12/09 0718) Temp Source: Oral (12/09 0718) BP: 128/54 (12/09 0700) Pulse Rate: 84 (12/09 0700)  Labs: Recent Labs    04/03/20 0956 04/03/20 1156 04/03/20 1225 04/03/20 1932 04/04/20 0128  HGB 10.9* 10.3* 9.5*  --  10.2*  HCT 33.4* 32.6* 28.0*  --  31.0*  PLT 322 268  --   --  277  APTT  --  33  --   --   --   LABPROT 21.5* 21.8*  --   --   --   INR 1.9* 2.0*  --   --   --   CREATININE 10.39* 10.45*  --  6.28* 6.78*  TROPONINIHS 33* 41*  --   --   --     Estimated Creatinine Clearance: 12.9 mL/min (A) (by C-G formula based on SCr of 6.78 mg/dL (H)).  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

## 2020-04-04 NOTE — Progress Notes (Signed)
VO given by Dr. Augustin Coupe to change diet from NPO to Renal diet.

## 2020-04-04 NOTE — Progress Notes (Signed)
[name] Progress Note   69 year old male with a history of HTN, PVD, HFrEF (EF 35-40%), mod aortic stenosis, a fib on coumadin, cardiomyopathy, CAD, CKD stage 3, HLD, tobacco use, obesity and left low/mid knee amputation in 6/21 who presented with worsening SOB on a NRB, labored breathing, bradycardia down to 40-50s with widened complex.  P/w  nausea, vomiting, and diarrhea x1 day with weakness and dyspnea.  He was on Spironolactone and Lasix. He denies obstructive symptoms, hematuria, NSAID use.    Assessment/ Plan:   1) Acute Kidney Injury  On CKDIIIA - creatinine has been slowly increasing since 10/03/2019 when int was 0.97. Cr was 1.23 02/15/2020 and then on presentation 10.39 with hyperkalemia. Appreciate CCM  Placing the  dialysis catheter so promptly 12/8.  COVID neg.  - No e/o  obstruction on  renal ultrasound. - Urine studies (ur na and UPC) -> UPC 2 - Will send serologies if there's any e/o active sediment or significant proteinuria -> sediment doesn't appear active but will send serologies given such an acute injury. More likely is the acute injury was secondary to the spironolactone and Lasix. - Hesitant to give too much volume as he is already edematous and h/o HFrEF; hold Lasix, spironolactone and ARB for now. - Renal dose medications for GFR <15. - Tolerated HD #1 on 12/9 -> planning on HD #2 today and then will reassess in the AM.  2) Hyperkalemia with peaked t waves + bradycardia - better after HD but still high this AM. Needs HD again today.  3) AFib - Coumadin  4) DM - on SSI  5) HTN - BP fairly well controlled at this time off meds  6) HFrEF with EF 35-40%  7) PAD s/p left BKA 6/21.  Subjective:   Mild dyspnea but denies nausea, vomiting, myalgias, fever, chills.   Objective:   BP (!) 128/54   Pulse 84   Temp 98.3 F (36.8 C) (Oral)   Resp (!) 25   Ht 6' (1.829 m)   Wt 103.1 kg   SpO2 96%   BMI 30.83 kg/m   Intake/Output  Summary (Last 24 hours) at 04/04/2020 0800 Last data filed at 04/04/2020 0700 Gross per 24 hour  Intake 1407.42 ml  Output 3000 ml  Net -1592.58 ml   Weight change:   Physical Exam: GEN: NAD, A&Ox3, NCAT HEENT: No conjunctival pallor, EOMI NECK: Supple, no thyromegaly LUNGS: CTA B/L no rales, rhonchi or wheezing CV: RRR, No M/R/G ABD: SNDNT +BS  EXT: Tr lower extremity edema    Imaging: US RENAL  Result Date: 04/03/2020 CLINICAL DATA:  69 year old male with renal failure. EXAM: RENAL / URINARY TRACT ULTRASOUND COMPLETE COMPARISON:  CTA chest 09/28/2019. FINDINGS: Right Kidney: Renal measurements: 12.1 x 5.5 x 5.0 cm = volume: 171 mL. Echogenicity within normal limits. No mass or hydronephrosis visualized. Left Kidney: Renal measurements: 13.0 x 5.4 x 5.7 cm = volume: 210 mL. Echogenicity within normal limits. No mass or hydronephrosis visualized. Bladder: Appears normal for degree of bladder distention. Other: None. IMPRESSION: Normal ultrasound appearance of both kidneys and the urinary bladder. Electronically Signed   By: Genevie Ann M.D.   On: 04/03/2020 20:26   DG Chest Port 1 View  Result Date: 04/03/2020 CLINICAL DATA:  Central line placement EXAM: PORTABLE CHEST 1 VIEW COMPARISON:  Same-day radiograph FINDINGS: Interval placement of left internal jugular approach dual lumen central venous catheter with distal tip terminating at the level of the right atrium.  Stable cardiomediastinal contours. Atherosclerotic calcification of the aortic knob. Linear atelectasis within the left mid lung. No pleural effusion or pneumothorax. IMPRESSION: Interval placement of left internal jugular approach central venous catheter with distal tip terminating at the level of the right atrium. No pneumothorax. Electronically Signed   By: Davina Poke D.O.   On: 04/03/2020 15:08   DG Chest Port 1 View  Result Date: 04/03/2020 CLINICAL DATA:  Shortness of breath. EXAM: PORTABLE CHEST 1 VIEW COMPARISON:   October 02, 2019. FINDINGS: Right hilar prominence is concerning for possible mass or adenopathy. No pneumothorax pleural effusion is noted. Right lung is clear. Stable pleural thickening is noted in the left lung laterally. Mild left basilar atelectasis or scarring possibly inflammation is noted. Bony thorax is unremarkable. IMPRESSION: Right hilar prominence is noted concerning for possible mass or adenopathy. CT scan of the chest with intravenous contrast is recommended for further evaluation. Mild left basilar atelectasis or scarring is noted. Electronically Signed   By: Marijo Conception M.D.   On: 04/03/2020 10:31    Labs: BMET Recent Labs  Lab 04/03/20 0956 04/03/20 1156 04/03/20 1225 04/03/20 1932 04/04/20 0128  NA 133* 136 132* 132* 136  K >7.5* 7.1* 6.7* 5.5* 6.0*  CL 94* 95*  --  91* 93*  CO2 16* 17*  --  22 26  GLUCOSE 131* 218*  --  136* 137*  BUN 103* 117*  --  57* 69*  CREATININE 10.39* 10.45*  --  6.28* 6.78*  CALCIUM 8.8* 9.1  --  8.2* 8.3*  PHOS  --  8.2*  --   --  5.0*   CBC Recent Labs  Lab 04/03/20 0956 04/03/20 1156 04/03/20 1225 04/04/20 0128  WBC 13.8* 11.3*  --  14.0*  NEUTROABS 12.1*  --   --   --   HGB 10.9* 10.3* 9.5* 10.2*  HCT 33.4* 32.6* 28.0* 31.0*  MCV 94.9 96.7  --  91.2  PLT 322 268  --  277    Medications:    . Chlorhexidine Gluconate Cloth  6 each Topical Q0600  . heparin sodium (porcine)  4,000 Units Intravenous Once  . insulin aspart  0-6 Units Subcutaneous Q4H  . mouth rinse  15 mL Mouth Rinse BID  . mupirocin ointment   Nasal BID  . pantoprazole (PROTONIX) IV  40 mg Intravenous QHS  . sodium zirconium cyclosilicate  10 g Oral TID  . warfarin  7.5 mg Oral ONCE-1600  . Warfarin - Pharmacist Dosing Inpatient   Does not apply q1600      Otelia Santee, MD 04/04/2020, 8:00 AM

## 2020-04-04 NOTE — Progress Notes (Signed)
Pharmacy Electrolyte Replacement  Recent Labs:  Recent Labs    04/04/20 0128  K 6.0*  MG 1.6*  PHOS 5.0*  CREATININE 6.78*   Plan:  Mg 4 g x 1  Barth Kirks, PharmD, BCPS, BCCCP Clinical Pharmacist [phone]  Please check AMION for all [place] numbers  04/04/2020 7:43 AM

## 2020-04-04 NOTE — Progress Notes (Addendum)
Initial Nutrition Assessment  DOCUMENTATION CODES:   Not applicable  INTERVENTION:   PROSource Plus 30 mL BID, each supplement provides 100 kcals, and 15 grams protein  Renal MVI  Plan to provide further diet education on follow-up based on poc, need for iHD, etc  NUTRITION DIAGNOSIS:   Increased nutrient needs related to acute illness (AKI on dialysis) as evidenced by estimated needs  GOAL:   Patient will meet greater than or equal to 90% of their needs   MONITOR:   PO intake,Labs,Weight trends,Supplement acceptance  REASON FOR ASSESSMENT:   Consult Assessment of nutrition requirement/status  ASSESSMENT:   Pt is a 69 y.o. male with PMH of HFrEF with EF 35-40%, aortic stenosis, chronic Afib on Coumadin, CKD III, DM, and peripheral vascular disease with diabetes. LE BKA in June. Pt has prosthetic leg. Limited mobility. Presented with bradycardia, N/V/D, and acute on chronic renal failure associated with hyperkalemia requiring initiation of iHD  Pt stated his appetite is good prior and during this admission. Pt states he consumes 2 meals a day. Pt states he "eats when he gets hungry". Did not obtain very detailed diet history however, pt girlfriend prepares pt meals of meat, starch, vegetables. Pt reports he likes ice cream and cookies as a snack. No documented meal completion records, however pt reported good intake at breakfast and eating well at lunch today.   Pt states he has not lost or gained any weight. Per chart review, pt weight has been stable over the past 6 months.    Pt received greens and salad on lunch tray and was planning on not eating as he's on coumadin. Pt states he likes greens very much and would like to eat them. RD provided education that vitamin K containing foods are okay to eat while on coumadin. The most important thing is keeping intake consistent. Pt happy to hear this.   Pt briefly educated on current renal diet explaining that it restricts  potassium and sodium, pt verbalized understanding with plans to follow-up with further diet education based on plan of care.   Labs reviewed: CBG's x 24: 139-174, Potassium 4.9   Medications reviewed and include: Lokelma TID, protonix, SSI, coumadin,    NUTRITION - FOCUSED PHYSICAL EXAM:  Flowsheet Row Most Recent Value  Orbital Region No depletion  Upper Arm Region No depletion  Thoracic and Lumbar Region No depletion  Buccal Region No depletion  Temple Region No depletion  Clavicle Bone Region No depletion  Clavicle and Acromion Bone Region No depletion  Scapular Bone Region No depletion  Dorsal Hand No depletion  Patellar Region No depletion  Anterior Thigh Region No depletion  Posterior Calf Region No depletion  Edema (RD Assessment) Mild  Hair Reviewed  Eyes Reviewed  Mouth Unable to assess  Skin Reviewed  Nails Reviewed        Diet Order:   Diet Order             Diet renal with fluid restriction Fluid restriction: 1200 mL Fluid; Room service appropriate? Yes; Fluid consistency: Thin  Diet effective now                   EDUCATION NEEDS:   Education needs have been addressed  Skin:  Skin Assessment: Reviewed RN Assessment  Last BM:  PTA  Height:   Ht Readings from Last 1 Encounters:  04/03/20 6' (1.829 m)    Weight:   Wt Readings from Last 1 Encounters:  04/04/20 104 kg  Ideal Body Weight:  80.9 kg  BMI:  Body mass index is 31.1 kg/m.  Estimated Nutritional Needs:   Kcal:  2100-2300  Protein:  115-130  Fluid:  >2 L/day    Ronnald Nian, Dietetic Intern Pager: [phone] If unavailable: [phone]

## 2020-04-04 NOTE — Progress Notes (Addendum)
Pt just arrived from HD... no complications reported  Pt reports he voided urine x2 while in HD  Pt is A&O x4... no acute distress.

## 2020-04-04 NOTE — Progress Notes (Signed)
Pt off the floor for HD.... Transported him w/no complications.

## 2020-04-04 NOTE — Progress Notes (Signed)
[facility] Progress Note Patient Name: Rodney [name] DOB: [birth year] MRN: [id number]   Date of Service  04/04/2020  HPI/Events of Note  Patient's lactic acid level up to 5.8 from prior 5.0, will need to r/o cardiogenic shock as etiology.  eICU Interventions  Will check BNP and if markedly elevated will start Dobutamine 2.5 mcg / kg / min.        Elyjah Hazan U Whittney Steenson 04/04/2020, 12:30 AM

## 2020-04-05 LAB — GLOMERULAR BASEMENT MEMBRANE ANTIBODIES: GBM Ab: 5 units (ref 0–20)

## 2020-04-05 LAB — C3 COMPLEMENT: C3 Complement: 117 mg/dL (ref 82–167)

## 2020-04-05 LAB — GLUCOSE, CAPILLARY
Glucose-Capillary: 135 mg/dL — ABNORMAL HIGH (ref 70–99)
Glucose-Capillary: 124 mg/dL — ABNORMAL HIGH (ref 70–99)
Glucose-Capillary: 173 mg/dL — ABNORMAL HIGH (ref 70–99)
Glucose-Capillary: 225 mg/dL — ABNORMAL HIGH (ref 70–99)
Glucose-Capillary: 189 mg/dL — ABNORMAL HIGH (ref 70–99)
Glucose-Capillary: 183 mg/dL — ABNORMAL HIGH (ref 70–99)

## 2020-04-05 LAB — BASIC METABOLIC PANEL
Anion gap: 11 (ref 5–15)
BUN: 36 mg/dL — ABNORMAL HIGH (ref 8–23)
CO2: 27 mmol/L (ref 22–32)
Calcium: 8.2 mg/dL — ABNORMAL LOW (ref 8.9–10.3)
Chloride: 97 mmol/L — ABNORMAL LOW (ref 98–111)
Creatinine, Ser: 4.08 mg/dL — ABNORMAL HIGH (ref 0.61–1.24)
GFR, Estimated: 15 mL/min — ABNORMAL LOW (ref >60)
Glucose, Bld: 160 mg/dL — ABNORMAL HIGH (ref 70–99)
Potassium: 3.6 mmol/L (ref 3.5–5.1)
Sodium: 135 mmol/L (ref 135–145)

## 2020-04-05 LAB — CBC
HCT: 29.2 % — ABNORMAL LOW (ref 39.0–52.0)
Hemoglobin: 10.1 g/dL — ABNORMAL LOW (ref 13.0–17.0)
MCH: 31.2 pg (ref 26.0–34.0)
MCHC: 34.6 g/dL (ref 30.0–36.0)
MCV: 90.1 fL (ref 80.0–100.0)
Platelets: 220 10*3/uL (ref 150–400)
RBC: 3.24 MIL/uL — ABNORMAL LOW (ref 4.22–5.81)
RDW: 13.2 % (ref 11.5–15.5)
WBC: 9.3 10*3/uL (ref 4.0–10.5)
nRBC: 0 % (ref 0.0–0.2)

## 2020-04-05 LAB — PROTIME-INR
INR: 1.8 — ABNORMAL HIGH (ref 0.8–1.2)
Prothrombin Time: 20.4 seconds — ABNORMAL HIGH (ref 11.4–15.2)

## 2020-04-05 LAB — ANCA TITERS
Atypical P-ANCA titer: <1:20 {titer}
C-ANCA: <1:20 {titer}
P-ANCA: <1:20 {titer}

## 2020-04-05 LAB — C4 COMPLEMENT: Complement C4, Body Fluid: 31 mg/dL (ref 12–38)

## 2020-04-05 LAB — RHEUMATOID FACTOR: Rheumatoid fact SerPl-aCnc: <10 IU/mL (ref <14.0)

## 2020-04-05 LAB — ANTINUCLEAR ANTIBODIES, IFA: ANA Ab, IFA: NEGATIVE

## 2020-04-05 LAB — ANTI-DNA ANTIBODY, DOUBLE-STRANDED: ds DNA Ab: 1 IU/mL (ref 0–9)

## 2020-04-05 MED ORDER — WARFARIN SODIUM 7.5 MG PO TABS
7.5000 mg | ORAL_TABLET | Freq: Once | ORAL | Status: AC
  Administered 2020-04-05: 7.5 mg via ORAL
  Filled 2020-04-05: qty 1

## 2020-04-05 MED ORDER — ALBUTEROL SULFATE (2.5 MG/3ML) 0.083% IN NEBU
2.5000 mg | INHALATION_SOLUTION | Freq: Four times a day (QID) | RESPIRATORY_TRACT | Status: DC | PRN
  Administered 2020-04-05: 2.5 mg via RESPIRATORY_TRACT
  Filled 2020-04-05: qty 3

## 2020-04-05 MED ORDER — PROSOURCE PLUS PO LIQD
30.0000 mL | Freq: Two times a day (BID) | ORAL | Status: DC
  Administered 2020-04-06 – 2020-04-09 (×6): 30 mL via ORAL
  Filled 2020-04-05 (×8): qty 30

## 2020-04-05 MED ORDER — RENA-VITE PO TABS
1.0000 | ORAL_TABLET | Freq: Every day | ORAL | Status: DC
  Administered 2020-04-05 – 2020-04-08 (×4): 1 via ORAL
  Filled 2020-04-05 (×4): qty 1

## 2020-04-05 MED ORDER — ACETAMINOPHEN 325 MG PO TABS
650.0000 mg | ORAL_TABLET | Freq: Four times a day (QID) | ORAL | Status: DC | PRN
  Administered 2020-04-05: 650 mg via ORAL
  Filled 2020-04-05: qty 2

## 2020-04-05 NOTE — Hospital Course (Addendum)
Rodney [name] is a 69 y.o. male that presented in acute renal failure with severe hyperkalemia with EKG changes. PMH significant for HFrEF, CKD III, and Atrial fibrillation on coumadin, tobacco abuse, left low/mid knee amputation.  Acute renal failure in the setting of CKD stage IIIa  Refractory hyperkalemia with EKG changes, resolved Patient presented in acute renal failure with serum creatinine of 10.45, BUN 117, K 6.7, high anion gap metabolic acidosis.  Patient was treated with calcium gluconate, D5/insulin and albuterol therapy, which did not improve the potassium.  Due to severe refractory hyperkalemia with EKG changes, a central venous catheter was placed and patient underwent emergent hemodialysis.  Patient was also noted to have a lactic acidosis after the multiple rounds of albuterol, and patient was also previously on Metformin which could have been contributing to this presentation as well. No obstruction found on renal ultrasound. During this admission, patient underwent hemodialysis sessions 12/8-12 with subsequent improvement in renal function and electrolytes. Nephrology cleared patient for safe discharge once creatinine down-trended two days in a row. Nephro stated it may be prudent to avoid spironolactone in the future given risk for hyperkalemia with concomitant ARB use, so it was discontinued at discharge with plan for PCP and nephro outpatient evaluation. Nephro also recommended resumption of losartan when creatinine is back to baseline and would recommend furosemide only as needed for volume overload. On day of discharge, creatinine 1.84 and patient had no complaints.   HFrEF Patient was found to be in volume overload and edematous with a history of HFrEF (last EF 30-35); Lasix, spironolactone, and Losartan were held. Coreg 12.5 mg BID was continued.   Interstitial finding on CXR On 12/8, patient found to have right hilar prominence concerning for mass or adenopathy. CT scan of  the chest with intravenous contrast is recommended for further evaluation. Did not do during hospitalization due to renal failure. PCP to coordinate once creatinine normalizes and is safe.   Hypertension Home medications of Losartan, spironolactone, and Lasix were held due to kidney injury and concern that they could have caused the injury. Blood pressures were normotensive during most of the admission. Upon discharge, Losartan, spironolactone, and lasix discontinued per nephrology. Follow up appointment with PCP and nephrologist to restart medications at safe interval.   T2DM A1C 7.5 at this admission. Blood sugars were monitored and patient was placed on sliding scale insulin. Patient was discharged with metformin reduced to 500 mg BID. Will follow up with PCP to increase or change medications as appropriate.

## 2020-04-05 NOTE — Progress Notes (Addendum)
Family Medicine Teaching Service Daily Progress Note Intern Pager: [phone]  Patient name: Rodney [name] Medical record number: [id number] Date of birth: Nov 18, 1950 Age: 69 y.o. Gender: male  Primary Care Provider: [name], MD Consultants: cardiology, nephrology  Code Status: Full Code   Pt Overview and Major Events to Date:  Hospital Day: 4 04/03/2020: admitted for Shortness of Breath 12/8/22021: HD line placed   Assessment and Plan: Rodney [name] is a 69 y.o. male who presented w/ with shortness of breath, hyperkalemia with EKG changes secondary to acute renal failure. PMHx s/f HTN, PVD, HFrEF, moderate aortic stenosis, A. fib on Coumadin, CAD, CKD stage III, HLD, tobacco use, obesity, left BKA.  AKI, improving  CKD 3A Emergency dialysis starting 12/8. Creatinine is 3.6 this morning. Overall, kidneys appear to be improving. Autodiuresing w/ UOP 4L yesterday. Work up per nephology (no obstruction on renal ultrasound, serologies pending, glomerular labs pending).  - Nephrology consulted, appreciate recommendations  - hold lasix, spironolactone and losartan  - monitor volume status closely - strict IO  - discontinue fluid restriction with renal diet  - AM RFP  Hyperkalemia, resolved  K 3.5 this AM  Dyspnea, resolved  Edema resolved. Patient noted to desat overnight, likely due to undiagnosed OSA. Patient reports no CPAP at home. Satting well on RA this morning. Denies any history of asthma or COPD.  - would benefit from outpatient sleep study  - can continue O2 [place] PRN   Incidental finding on CXR  04/03/20 CXR: Right hilar prominence is noted concerning for possible mass or adenopathy. CT scan of the chest with intravenous contrast is recommended for further evaluation. Unclear if this has been discussed with patient due to his acute issues on admission.  - recommendations in DC summary  - consider when improved kidney function - discuss with patient prior to  discharge   T2DM  HLD A1C 7.5 this admission. On sliding scale, obtained 5 units of aspart in last 24 hours with CBG 120-220.  Home meds include: Lipitor 80 mg, metformin 1000 mg BID, novolog 70/30 30u breakfast ,15u dinner  - Restart home lipitor  - vsSSI + TID AC CBG   HTN  Normotensive. Home meds include losartan 25 mg & lasix 20 mg.  - holding both medications in setting of AKI   CAD  PVD  S/p Left BKA Home meds include: ASA 81  - restart home ASA  - PT/OT consulted   HFrEF Cardiomyopathy  Moderate aortic stenosis  Home meds include: lasix 20 mg, aldactone 25 mg, coreg 12.5 mg  - continue home coreg 12.5 mg BID - hold lasix, spironolactone and losartan   A fib on Coumadin  NSR this AM.  - continue home amiodarone & coumadin per pharmacy  - continue telemetry   Tobacco use  - encourage cessation   Anemia chronic disease  Hemoglobin 9.2 his AM. Baseline 9-10.   FEN/GI: Renal diet VTE prophylaxis: Coumadin  Status is: Inpatient  Remains inpatient appropriate because:Ongoing diagnostic testing needed not appropriate for outpatient work up and Inpatient level of care appropriate due to severity of illness   Dispo: The patient is from: Home              Anticipated d/c is to:  TBA, pending PT/OT evaluation              Anticipated d/c date is: 3 days              Patient currently is  not medically stable to d/c.  Subjective:  NAEO.   Objective: Temp:  [97.8 F (36.6 C)-98.9 F (37.2 C)] 98.6 F (37 C) (12/11 0336) Pulse Rate:  [68-87] 72 (12/11 0531) Cardiac Rhythm: Normal sinus rhythm (12/11 0336) Resp:  [16-30] 19 (12/11 0531) BP: (112-147)/(65-87) 147/73 (12/11 0531) SpO2:  [88 %-97 %] 97 % (12/11 0531) Intake/Output      12/10 0701 12/11 0700   P.O. 1380   Total Intake(mL/kg) 1380 (13.3)   Urine (mL/kg/hr) 4125 (1.7)   Total Output 4125   Net -2745           Physical Exam: General: NAD, non-toxic, well-appearing, sitting comfortably in bed     HEENT: [place]/AT. PERRLA. EOMI.  Cardiovascular: RRR, normal S1, S2. B/L 2+ RP. No LEE Respiratory: Crackles at bilateral bases. Mild rhonchi that improve with cough. No IWOB.  Abdomen: + BS. NT, ND, soft to palpation.  Extremities: Warm and well perfused. Moving spontaneously.  Integumentary: No obvious rashes, lesions, trauma on general exam. Neuro: CN grossly intact. No FND  Laboratory: I have personally read and reviewed all labs and imaging studies.  CBC: Recent Labs  Lab 04/03/20 0956 04/03/20 1156 04/04/20 0128 04/05/20 0811 04/06/20 0341 04/06/20 0441  WBC 13.8*   < > 14.0* 9.3 7.9  --   NEUTROABS 12.1*  --   --   --   --   --   HGB 10.9*   < > 10.2* 10.1* 9.5* 9.2*  HCT 33.4*   < > 31.0* 29.2* 29.1* 27.0*  MCV 94.9   < > 91.2 90.1 90.7  --   PLT 322   < > 277 220 218  --    < > = values in this interval not displayed.   CMP: Recent Labs  Lab 04/03/20 1156 04/03/20 1225 04/04/20 0128 04/04/20 1152 04/05/20 0811 04/06/20 0341 04/06/20 0441  NA 136   < > 136 134* 135 134* 134*  K 7.1*   < > 6.0* 4.9 3.6 3.5 3.5  CL 95*   < > 93* 92* 97* 96*  --   CO2 17*   < > 26 29 27 28   --   GLUCOSE 218*   < > 137* 178* 160* 166*  --   BUN 117*   < > 69* 66* 36* 38*  --   CREATININE 10.45*   < > 6.78* 6.60* 4.08* 3.60*  --   CALCIUM 9.1   < > 8.3* 7.9* 8.2* 8.1*  --   MG 1.6*  --  1.6*  --   --   --   --   PHOS 8.2*  --  5.0*  --   --   --   --   ALBUMIN 3.0*  --   --   --   --   --   --    < > = values in this interval not displayed.   CBG: Recent Labs  Lab 04/05/20 1113 04/05/20 1542 04/05/20 1956 04/05/20 2323 04/06/20 0324  GLUCAP 173* 225* 189* 183* 167*   Micro: Covid Negative  Imaging/Diagnostic Tests: No results found.    Wilber Oliphant, MD 04/06/2020, 6:47 AM PGY-3, [place] Intern pager: [phone], text pages welcome

## 2020-04-05 NOTE — Progress Notes (Signed)
[facility] Progress Note   69 year old male with a history of HTN, PVD, HFrEF (EF 35-40%), mod aortic stenosis, a fib on coumadin, cardiomyopathy, CAD, CKD stage 3, HLD, tobacco use, obesity and left low/mid knee amputation in 6/21 who presented with worsening SOB on a NRB, labored breathing, bradycardia down to 40-50s with widened complex.  P/w  nausea, vomiting, and diarrhea x1 day with weakness and dyspnea.  He was on Spironolactone and Lasix. He denies obstructive symptoms, hematuria, NSAID use.    Assessment/ Plan:   1) Acute Kidney Injury  On CKDIIIA - creatinine has been slowly increasing since 10/03/2019 when int was 0.97. Cr was 1.23 02/15/2020 and then on presentation 10.39 with hyperkalemia. Appreciate CCM  Placing the  dialysis catheter so promptly 12/8.  COVID neg. - No obstruction on  renal ultrasound. - Urine studies (ur na and UPC) -> UPC 2 - Will send serologies if there's any e/o active sediment or significant proteinuria -> sediment doesn't appear active but will send serologies given such an acute injury. More likely is the acute injury was secondary to the spironolactone and Lasix. -ana, c3, c4, anti-dna, rf, anti-gbm pending - Hesitant to give too much volume as he is already edematous and h/o HFrEF; hold Lasix, spironolactone and ARB for now. - Renal dose medications for GFR <15. - hold on HD today, will assess on a daily basis for further needs. Seems to be auto-diuresing now therefore recommend watching volume status closely. Encouraged patient to stay hydrated  2) Hyperkalemia with peaked t waves + bradycardia - improved with HD, monitor. Continue with renal diet  3) AFib - Coumadin  4) DM - on SSI  5) HTN - BP fairly well controlled at this time off meds  6) HFrEF with EF 35-40%  7) PAD s/p left BKA 6/21.  Subjective:   No complaints, acute issues. uop 3.2L   Objective:   BP 112/87   Pulse 74   Temp 98.2 F (36.8 C) (Oral)   Resp  (!) 30   Ht 6' (1.829 m)   Wt 104 kg   SpO2 92%   BMI 31.10 kg/m   Intake/Output Summary (Last 24 hours) at 04/05/2020 1353 Last data filed at 04/05/2020 1200 Gross per 24 hour  Intake 480 ml  Output 5100 ml  Net -4620 ml   Weight change: -11.7 kg  Physical Exam: GEN: NAD, A&Ox3, NCAT HEENT: No conjunctival pallor, EOMI NECK: Supple, no thyromegaly LUNGS: CTA B/L no rales, rhonchi or wheezing CV: RRR, No M/R/G ABD: SNDNT +BS  EXT: Tr lower extremity edema Neuro: speech clear and coherent, moves all ext spontaneously    Imaging: US RENAL  Result Date: 04/03/2020 CLINICAL DATA:  69 year old male with renal failure. EXAM: RENAL / URINARY TRACT ULTRASOUND COMPLETE COMPARISON:  CTA chest 09/28/2019. FINDINGS: Right Kidney: Renal measurements: 12.1 x 5.5 x 5.0 cm = volume: 171 mL. Echogenicity within normal limits. No mass or hydronephrosis visualized. Left Kidney: Renal measurements: 13.0 x 5.4 x 5.7 cm = volume: 210 mL. Echogenicity within normal limits. No mass or hydronephrosis visualized. Bladder: Appears normal for degree of bladder distention. Other: None. IMPRESSION: Normal ultrasound appearance of both kidneys and the urinary bladder. Electronically Signed   By: Genevie Ann M.D.   On: 04/03/2020 20:26   DG Chest Port 1 View  Result Date: 04/03/2020 CLINICAL DATA:  Central line placement EXAM: PORTABLE CHEST 1 VIEW COMPARISON:  Same-day radiograph FINDINGS: Interval placement of left internal jugular  approach dual lumen central venous catheter with distal tip terminating at the level of the right atrium. Stable cardiomediastinal contours. Atherosclerotic calcification of the aortic knob. Linear atelectasis within the left mid lung. No pleural effusion or pneumothorax. IMPRESSION: Interval placement of left internal jugular approach central venous catheter with distal tip terminating at the level of the right atrium. No pneumothorax. Electronically Signed   By: Davina Poke D.O.    On: 04/03/2020 15:08    Labs: BMET Recent Labs  Lab 04/03/20 0956 04/03/20 1156 04/03/20 1225 04/03/20 1932 04/04/20 0128 04/04/20 1152 04/05/20 0811  NA 133* 136 132* 132* 136 134* 135  K >7.5* 7.1* 6.7* 5.5* 6.0* 4.9 3.6  CL 94* 95*  --  91* 93* 92* 97*  CO2 16* 17*  --  22 26 29 27   GLUCOSE 131* 218*  --  136* 137* 178* 160*  BUN 103* 117*  --  57* 69* 66* 36*  CREATININE 10.39* 10.45*  --  6.28* 6.78* 6.60* 4.08*  CALCIUM 8.8* 9.1  --  8.2* 8.3* 7.9* 8.2*  PHOS  --  8.2*  --   --  5.0*  --   --    CBC Recent Labs  Lab 04/03/20 0956 04/03/20 1156 04/03/20 1225 04/04/20 0128 04/05/20 0811  WBC 13.8* 11.3*  --  14.0* 9.3  NEUTROABS 12.1*  --   --   --   --   HGB 10.9* 10.3* 9.5* 10.2* 10.1*  HCT 33.4* 32.6* 28.0* 31.0* 29.2*  MCV 94.9 96.7  --  91.2 90.1  PLT 322 268  --  277 220    Medications:    . (feeding supplement) PROSource Plus  30 mL Oral BID BM  . amiodarone  200 mg Oral Daily  . carvedilol  12.5 mg Oral BID WC  . Chlorhexidine Gluconate Cloth  6 each Topical Q0600  . heparin sodium (porcine)  4,000 Units Intravenous Once  . insulin aspart  0-6 Units Subcutaneous Q4H  . mouth rinse  15 mL Mouth Rinse BID  . multivitamin  1 tablet Oral QHS  . mupirocin ointment   Nasal BID  . pantoprazole (PROTONIX) IV  40 mg Intravenous QHS  . Warfarin - Pharmacist Dosing Inpatient   Does not apply [id number]

## 2020-04-05 NOTE — Progress Notes (Signed)
ANTICOAGULATION CONSULT NOTE - Follow-Up Consult  Pharmacy Consult for warfarin Indication: atrial fibrillation  No Known Allergies  Patient Measurements: Height: 6' (182.9 cm) Weight: 104 kg (229 lb 4.5 oz) IBW/kg (Calculated) : 77.6  Vital Signs: Temp: 98.2 F (36.8 C) (12/10 1116) Temp Source: Oral (12/10 1116) BP: 112/87 (12/10 1200) Pulse Rate: 74 (12/10 1200)  Labs: Recent Labs    04/03/20 0956 04/03/20 1156 04/03/20 1225 04/03/20 1932 04/04/20 0128 04/04/20 1152 04/05/20 0147 04/05/20 0811  HGB 10.9* 10.3* 9.5*  --  10.2*  --   --  10.1*  HCT 33.4* 32.6* 28.0*  --  31.0*  --   --  29.2*  PLT 322 268  --   --  277  --   --  220  APTT  --  33  --   --   --   --   --   --   LABPROT 21.5* 21.8*  --   --   --   --  20.4*  --   INR 1.9* 2.0*  --   --   --   --  1.8*  --   CREATININE 10.39* 10.45*  --    < > 6.78* 6.60*  --  4.08*  TROPONINIHS 33* 41*  --   --   --   --   --   --    < > = values in this interval not displayed.    Estimated Creatinine Clearance: 21.6 mL/min (A) (by C-G formula based on SCr of 4.08 mg/dL (H)).  Assessment: 68 yo M presented with ARF and hyperkalemia.  Now s/p emergent hemodialysis.  Pt was on warfarin PTA for afib.  INR trending down after doses held on admission 12/8.    PTA dose was 5mg  daily except 7.5mg  on Wednesdays  Goal of Therapy:  INR 2-3 Monitor platelets by anticoagulation protocol: Yes   Plan:  Warfarin 7.5 mg x 1 Daily INR  [employer], Pharm.D., BCPS Clinical Pharmacist Clinical phone for 04/05/2020 from 8:30-4:00 is [phone].  **Pharmacist phone directory can be found on [email or web address] listed under [place].  04/05/2020 1:53 PM

## 2020-04-05 NOTE — Progress Notes (Signed)
Family Medicine Teaching Service Daily Progress Note Intern Pager: [phone]  Patient name: Rodney [name] Medical record number: [id number] Date of birth: 03-15-1951 Age: 69 y.o. Gender: male  Primary Care Provider: [name], MD Consultants: Nephrology Code Status: Full  Pt Overview and Major Events to Date:  12/8 - admitted with symptomatic bradycardia and hyperkalemia 12/8 - received HD 12/9 - received HD  Assessment and Plan: [name] is a 69 y.o. male that presented in acute renal failure with severe hyperkalemia with EKG changes. PMH significant for HFrEF, CKD III, and Atrial fibrillation on coumadin, tobacco abuse, left low/mid knee amputation  Acute on chronic renal failure (CKD stage IIIa) Refractory hyperkalemia with EKG changes, improved High AG metabolic acidosis/lactic acidosis Patient received 2 sessions of HD since admission. Baseline Cr is 1.23, this morning 4.08. - Nephrology consulted, appreciate recs - Lokelma per nephrology recommendations - Avoid nephrotoxic agents as able - Monitor with BMPs   Chronic HFrEF (EF 30-35%)  Aortic stenosis - Hold losartan and spironolactone. Consider restarting ASA. - Continue home coreg  Chronic A. Fib Well controlled on amiodarone and coreg. Chronically anticoagulated with coumadin. - Continue home amiodarone and coreg - Continue coumadin  T2DM, poorly controlled Metformin was held after patient received albuterol treatment for hyperkalemia and patient had lactic acidosis.  - Hold metformin   FEN/GI: Renal diet PPx: Coumadin   Status is: Inpatient  Remains inpatient appropriate because:Persistent severe electrolyte disturbances, IV treatments appropriate due to intensity of illness or inability to take PO and Inpatient level of care appropriate due to severity of illness   Dispo: The patient is from: Home              Anticipated d/c is to: Home              Anticipated d/c date is: 2 days               Patient currently is not medically stable to d/c.     Subjective:  Patient reports that he feels much better than he did admission.  He does state that he has been not sleeping very well due to nurses and he will coming in" poking him with needles and doing vitals".  Objective: Temp:  [98.2 F (36.8 C)-98.4 F (36.9 C)] 98.2 F (36.8 C) (12/10 1116) Pulse Rate:  [74-92] 74 (12/10 1200) Resp:  [16-30] 30 (12/10 1200) BP: (106-136)/(60-87) 112/87 (12/10 1200) SpO2:  [92 %-98 %] 92 % (12/10 1200) Weight:  [104 kg] 104 kg (12/10 0500) Physical Exam: General: NAD, supine in bed Cardiovascular: normal rate, no rubs or gallops noted  Respiratory: CTAB, normal WOB Abdomen: soft, non-tender, non-distended, obese Extremities: left mid/leg BKA  Laboratory: Recent Labs  Lab 04/03/20 1156 04/03/20 1225 04/04/20 0128 04/05/20 0811  WBC 11.3*  --  14.0* 9.3  HGB 10.3* 9.5* 10.2* 10.1*  HCT 32.6* 28.0* 31.0* 29.2*  PLT 268  --  277 220   Recent Labs  Lab 04/03/20 1156 04/03/20 1225 04/04/20 0128 04/04/20 1152 04/05/20 0811  NA 136   < > 136 134* 135  K 7.1*   < > 6.0* 4.9 3.6  CL 95*   < > 93* 92* 97*  CO2 17*   < > 26 29 27   BUN 117*   < > 69* 66* 36*  CREATININE 10.45*   < > 6.78* 6.60* 4.08*  CALCIUM 9.1   < > 8.3* 7.9* 8.2*  PROT 6.0*  --   --   --   --  BILITOT 0.8  --   --   --   --   ALKPHOS 52  --   --   --   --   ALT 22  --   --   --   --   AST 27  --   --   --   --   GLUCOSE 218*   < > 137* 178* 160*   < > = values in this interval not displayed.     Imaging/Diagnostic Tests: No results found.   Rise Patience, DO 04/05/2020, 12:42 PM PGY-1, [place] Intern pager: [phone], text pages welcome

## 2020-04-06 LAB — POCT I-STAT 7, (LYTES, BLD GAS, ICA,H+H)
Acid-Base Excess: 4 mmol/L — ABNORMAL HIGH (ref 0.0–2.0)
Bicarbonate: 29 mmol/L — ABNORMAL HIGH (ref 20.0–28.0)
Calcium, Ion: 1.16 mmol/L (ref 1.15–1.40)
HCT: 27 % — ABNORMAL LOW (ref 39.0–52.0)
Hemoglobin: 9.2 g/dL — ABNORMAL LOW (ref 13.0–17.0)
O2 Saturation: 97 %
Patient temperature: 98.6
Potassium: 3.5 mmol/L (ref 3.5–5.1)
Sodium: 134 mmol/L — ABNORMAL LOW (ref 135–145)
TCO2: 30 mmol/L (ref 22–32)
pCO2 arterial: 44.7 mmHg (ref 32.0–48.0)
pH, Arterial: 7.42 (ref 7.350–7.450)
pO2, Arterial: 85 mmHg (ref 83.0–108.0)

## 2020-04-06 LAB — CBC
HCT: 29.1 % — ABNORMAL LOW (ref 39.0–52.0)
Hemoglobin: 9.5 g/dL — ABNORMAL LOW (ref 13.0–17.0)
MCH: 29.6 pg (ref 26.0–34.0)
MCHC: 32.6 g/dL (ref 30.0–36.0)
MCV: 90.7 fL (ref 80.0–100.0)
Platelets: 218 10*3/uL (ref 150–400)
RBC: 3.21 MIL/uL — ABNORMAL LOW (ref 4.22–5.81)
RDW: 12.6 % (ref 11.5–15.5)
WBC: 7.9 10*3/uL (ref 4.0–10.5)
nRBC: 0 % (ref 0.0–0.2)

## 2020-04-06 LAB — GLUCOSE, CAPILLARY
Glucose-Capillary: 167 mg/dL — ABNORMAL HIGH (ref 70–99)
Glucose-Capillary: 125 mg/dL — ABNORMAL HIGH (ref 70–99)
Glucose-Capillary: 165 mg/dL — ABNORMAL HIGH (ref 70–99)
Glucose-Capillary: 248 mg/dL — ABNORMAL HIGH (ref 70–99)
Glucose-Capillary: 226 mg/dL — ABNORMAL HIGH (ref 70–99)

## 2020-04-06 LAB — BASIC METABOLIC PANEL
Anion gap: 10 (ref 5–15)
BUN: 38 mg/dL — ABNORMAL HIGH (ref 8–23)
CO2: 28 mmol/L (ref 22–32)
Calcium: 8.1 mg/dL — ABNORMAL LOW (ref 8.9–10.3)
Chloride: 96 mmol/L — ABNORMAL LOW (ref 98–111)
Creatinine, Ser: 3.6 mg/dL — ABNORMAL HIGH (ref 0.61–1.24)
GFR, Estimated: 18 mL/min — ABNORMAL LOW (ref >60)
Glucose, Bld: 166 mg/dL — ABNORMAL HIGH (ref 70–99)
Potassium: 3.5 mmol/L (ref 3.5–5.1)
Sodium: 134 mmol/L — ABNORMAL LOW (ref 135–145)

## 2020-04-06 LAB — PROTIME-INR
INR: 1.7 — ABNORMAL HIGH (ref 0.8–1.2)
Prothrombin Time: 19.6 seconds — ABNORMAL HIGH (ref 11.4–15.2)

## 2020-04-06 MED ORDER — ASPIRIN EC 81 MG PO TBEC
81.0000 mg | DELAYED_RELEASE_TABLET | Freq: Every day | ORAL | Status: DC
  Administered 2020-04-06 – 2020-04-09 (×4): 81 mg via ORAL
  Filled 2020-04-06 (×4): qty 1

## 2020-04-06 MED ORDER — ATORVASTATIN CALCIUM 80 MG PO TABS
80.0000 mg | ORAL_TABLET | Freq: Every day | ORAL | Status: DC
  Administered 2020-04-06 – 2020-04-08 (×3): 80 mg via ORAL
  Filled 2020-04-06 (×2): qty 1

## 2020-04-06 MED ORDER — WARFARIN SODIUM 7.5 MG PO TABS
7.5000 mg | ORAL_TABLET | Freq: Once | ORAL | Status: AC
  Administered 2020-04-06: 17:00:00 7.5 mg via ORAL
  Filled 2020-04-06: qty 1

## 2020-04-06 MED ORDER — INSULIN ASPART 100 UNIT/ML ~~LOC~~ SOLN
0.0000 [IU] | Freq: Three times a day (TID) | SUBCUTANEOUS | Status: DC
  Administered 2020-04-06: 17:00:00 3 [IU] via SUBCUTANEOUS
  Administered 2020-04-06: 12:00:00 1 [IU] via SUBCUTANEOUS
  Administered 2020-04-07: 2 [IU] via SUBCUTANEOUS
  Administered 2020-04-07: 1 [IU] via SUBCUTANEOUS
  Administered 2020-04-08: 2 [IU] via SUBCUTANEOUS
  Administered 2020-04-08: 1 [IU] via SUBCUTANEOUS
  Administered 2020-04-08: 2 [IU] via SUBCUTANEOUS
  Administered 2020-04-09: 1 [IU] via SUBCUTANEOUS

## 2020-04-06 NOTE — Progress Notes (Signed)
[facility] Progress Note   69 year old male with a history of HTN, PVD, HFrEF (EF 35-40%), mod aortic stenosis, a fib on coumadin, cardiomyopathy, CAD, CKD stage 3, HLD, tobacco use, obesity and left low/mid knee amputation in 6/21 who presented with worsening SOB on a NRB, labored breathing, bradycardia down to 40-50s with widened complex.  P/w  nausea, vomiting, and diarrhea x1 day with weakness and dyspnea.  He was on Spironolactone and Lasix. He denies obstructive symptoms, hematuria, NSAID use.    Assessment/ Plan:   1) AKI on CKDIIIA - creatinine has been slowly increasing since 10/03/2019 when int was 0.97. Cr was 1.23 02/15/2020 and then on presentation 10.39 with hyperkalemia. HD start with temp line 12/8, appreciate CCM assistance with line placement.  COVID neg. - HD 12/8 and 12/9 - No obstruction on  renal ultrasound. - Urine studies (ur na and UPC) -> UPC 2 (likely related to AKI), would recheck once function more towards baseline -  More likely is the acute injury was secondary to Lasix/spirinolocatone/ARB. Continue to hold -c3, c4, anti gbm wnl. ANA/ANCA neg. -At this junction, he seems to be autodiuresing post acute tubular injury. Monitor volume status closely as he will now have a propensity to be volume down. Liberalizing his fluid restriction (if IV fluids warranted, would need to be done carefully in the context of his pre-existing HFrEF) -no HD today. If no plans on HD for the next 24-48 hours then recommend removing dialysis catheter - Renally dose medications for GFR <15. -Avoid nephrotoxic medications including NSAIDs and iodinated intravenous contrast exposure unless the latter is absolutely indicated.  Preferred narcotic agents for pain control are hydromorphone, fentanyl, and methadone. Morphine should not be used. Avoid Baclofen and avoid oral sodium phosphate and magnesium citrate based laxatives / bowel preps. Continue strict Input and Output  monitoring. Will monitor the patient closely with you and intervene or adjust therapy as indicated by changes in clinical status/labs    2) Hyperkalemia with peaked t waves + bradycardia - improved with HD, monitor for now.  3) AFib - Coumadin  4) DM - mgmt per primary service  5) HTN - BP fairly well controlled at this time off meds  6) HFrEF with EF 35-40%. Overall euvolemic on exam. 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  Subjective:   No complaints, acute issues. uop 4.1L. endorses thirst.   Objective:   BP (!) 147/73   Pulse 73   Temp 98.1 F (36.7 C) (Oral)   Resp 16   Ht 6' (1.829 m)   Wt 104 kg   SpO2 95%   BMI 31.10 kg/m   Intake/Output Summary (Last 24 hours) at 04/06/2020 1113 Last data filed at 04/06/2020 0800 Gross per 24 hour  Intake 1320 ml  Output 3250 ml  Net -1930 ml   Weight change:   Physical Exam: GEN: NAD HEENT: dry mucosal membranes LUNGS: CTA B/L no rales, rhonchi or wheezing CV: RRR, No M/R/G ABD: SNDNT +BS  EXT: no edema Neuro: speech clear and coherent, moves ext spontaneously    Imaging: No results found.  Labs: BMET Recent Labs  Lab 04/03/20 0956 04/03/20 1156 04/03/20 1225 04/03/20 1932 04/04/20 0128 04/04/20 1152 04/05/20 0811 04/06/20 0341 04/06/20 0441  NA 133* 136 132* 132* 136 134* 135 134* 134*  K >7.5* 7.1* 6.7* 5.5* 6.0* 4.9 3.6 3.5 3.5  CL 94* 95*  --  91*  93* 92* 97* 96*  --   CO2 16* 17*  --  22 26 29 27 28   --   GLUCOSE 131* 218*  --  136* 137* 178* 160* 166*  --   BUN 103* 117*  --  57* 69* 66* 36* 38*  --   CREATININE 10.39* 10.45*  --  6.28* 6.78* 6.60* 4.08* 3.60*  --   CALCIUM 8.8* 9.1  --  8.2* 8.3* 7.9* 8.2* 8.1*  --   PHOS  --  8.2*  --   --  5.0*  --   --   --   --    CBC Recent Labs  Lab 04/03/20 0956 04/03/20 1156 04/03/20 1225 04/04/20 0128 04/05/20 0811 04/06/20 0341 04/06/20 0441  WBC 13.8* 11.3*  --   14.0* 9.3 7.9  --   NEUTROABS 12.1*  --   --   --   --   --   --   HGB 10.9* 10.3*   < > 10.2* 10.1* 9.5* 9.2*  HCT 33.4* 32.6*   < > 31.0* 29.2* 29.1* 27.0*  MCV 94.9 96.7  --  91.2 90.1 90.7  --   PLT 322 268  --  277 220 218  --    < > = values in this interval not displayed.    Medications:    . (feeding supplement) PROSource Plus  30 mL Oral BID BM  . amiodarone  200 mg Oral Daily  . aspirin EC  81 mg Oral Daily  . atorvastatin  80 mg Oral q1800  . carvedilol  12.5 mg Oral BID WC  . Chlorhexidine Gluconate Cloth  6 each Topical Q0600  . heparin sodium (porcine)  4,000 Units Intravenous Once  . insulin aspart  0-6 Units Subcutaneous TID WC  . mouth rinse  15 mL Mouth Rinse BID  . multivitamin  1 tablet Oral QHS  . mupirocin ointment   Nasal BID  . warfarin  7.5 mg Oral ONCE-1600  . Warfarin - Pharmacist Dosing Inpatient   Does not apply [id number]

## 2020-04-06 NOTE — Progress Notes (Signed)
Occupational Therapy Evaluation Patient Details Name: Rodney [name] MRN: [id number] DOB: [birth year] Today's Date: 04/06/2020   Clinical Impression: Pt from home where he lives with his girlfriend and dog. Pt typically is mod I for ADL and mobility and girlfriend performs IADL. Pt today was able to demonstrate transfers and ADL at baseline level. No DOE during session today, session performed on RA with SpO2 >94%. Other VSS throughout session. Pt and girlfriend with no questions or concerns at end of session. Education completed, Pt at baseline, and OT to sign off at this time.     04/06/20 1000  OT Visit Information  Last OT Received On 04/06/20  PT/OT/SLP Co-Evaluation/Treatment Yes  Reason for Co-Treatment For patient/therapist safety;To address functional/ADL transfers  PT goals addressed during session Mobility/safety with mobility;Balance;Strengthening/ROM  OT goals addressed during session ADL's and self-care;Strengthening/ROM  History of Present Illness Rodney [name] is a 69 y.o. male who presented w/ with shortness of breath, hyperkalemia with EKG changes secondary to acute renal failure. PMHx s/f HTN, PVD, HFrEF, moderate aortic stenosis, A. fib on Coumadin, CAD, CKD stage III, HLD, tobacco use, obesity, left BKA.  Precautions  Precautions Fall  Precaution Comments watch O2 Saturations  Restrictions  Weight Bearing Restrictions No  Other Position/Activity Restrictions LLE prosthetic is NOT in room  Home Living  Family/patient expects to be discharged to: Private residence  Living Arrangements Spouse/significant other (girlfriend with her own health problems)  Available Help at Discharge Friend(s) (girlfriend)  Type of [place] entrance  Home Layout One level  Bathroom Shower/Tub Tub/shower unit  Corporate treasurer Yes  How Accessible Accessible via wheelchair (but only goes in there to bathe)  [place] - 2 wheels;BSC;Tub bench;Wheelchair - manual  Additional Comments Pt typically sleeps and functions in living room (uses [facility]) and transfers to [facility] and back  Prior Function  Level of Independence Independent with assistive device(s)  Comments Pt typically transfers independently to [facility] and BSC (his fiance has to hold the [facility] because it will rock - but Pt does not need help physically to transfer. He does not drive anymore (girlfriend DOES help with vehicle transfers) but patient bathes and dresses on his own and is participating in PT working on ambulation with prosthetic  Communication  Communication HOH  Cognition  Arousal/Alertness Awake/alert  Behavior During Therapy WFL for tasks assessed/performed (slightly agitated due to lack of sleep)  Overall Cognitive Status Within Functional Limits for tasks assessed  Upper Extremity Assessment  Upper Extremity Assessment Overall WFL for tasks assessed  Lower Extremity Assessment  Lower Extremity Assessment Defer to PT evaluation (baseline L BKA)  Cervical / Trunk Assessment  Cervical / Trunk Assessment Normal  ADL  Overall ADL's  At baseline  General ADL Comments Pt at baseline for transfers and ADL at this time. Pt typically spends most of his time in the living room and uses tub bench for bathing. his girlfriend holds the [facility] steady for him but does not physically assist. He was able to demonstrate this today during session  Vision- History  Patient Visual Report No change from baseline  Bed Mobility  Overal bed mobility Needs Assistance  Bed Mobility Supine to Sit;Sit to Supine  Supine to sit Supervision  Sit to supine Supervision  General bed mobility comments supervision for line management only  Transfers  Overall transfer level Modified independent  Equipment used None (transferred into [facility])  General transfer comment Pt  able to complete transfer to and from manual WC and bed without any assist.  Balance  Overall  balance assessment Modified Independent  General Comments  General comments (skin integrity, edema, etc.) Pt on RA throughout session and SpO2 remained >94% throughout session. Girlfriend present throughout session  OT - End of Session  Equipment Utilized During Treatment Other (comment) (WC)  Activity Tolerance Patient tolerated treatment well  Patient left in bed;with call bell/phone within reach;with family/visitor present  Nurse Communication Mobility status  OT Assessment  OT Recommendation/Assessment Patient does not need any further OT services  OT Visit Diagnosis Other abnormalities of gait and mobility (R26.89)  OT Problem List Decreased activity tolerance  AM-PAC OT "6 Clicks" Daily Activity Outcome Measure (Version 2)  Help from another person eating meals? 4  Help from another person taking care of personal grooming? 4  Help from another person toileting, which includes using toliet, bedpan, or urinal? 4  Help from another person bathing (including washing, rinsing, drying)? 3  Help from another person to put on and taking off regular upper body clothing? 4  Help from another person to put on and taking off regular lower body clothing? 4  6 Click Score 23  OT Recommendation  Follow Up Recommendations No OT follow up (continue [place] services)  OT Equipment None recommended by OT  Acute Rehab OT Goals  Patient Stated Goal to get home to dog  OT Goal Formulation With patient/family  Time For Goal Achievement 04/20/20  Potential to Achieve Goals Good  OT Time Calculation  OT Start Time (ACUTE ONLY) 1450  OT Stop Time (ACUTE ONLY) 1519  OT Time Calculation (min) 29 min  OT General Charges  $OT Visit 1 Visit  OT Evaluation  $OT Eval Moderate Complexity [place] OTR/L Acute Rehabilitation Services Pager: [phone] Office: [phone]

## 2020-04-06 NOTE — Progress Notes (Signed)
ANTICOAGULATION CONSULT NOTE - Follow-Up Consult  Pharmacy Consult for warfarin Indication: atrial fibrillation  No Known Allergies  Patient Measurements: Height: 6' (182.9 cm) Weight: 104 kg (229 lb 4.5 oz) IBW/kg (Calculated) : 77.6  Vital Signs: Temp: 98.1 F (36.7 C) (12/11 0727) Temp Source: Oral (12/11 0727) BP: 147/73 (12/11 0531) Pulse Rate: 73 (12/11 0825)  Labs: Recent Labs    04/03/20 1156 04/03/20 1225 04/04/20 0128 04/04/20 1152 04/05/20 0147 04/05/20 0811 04/06/20 0341 04/06/20 0441  HGB 10.3*   < > 10.2*  --   --  10.1* 9.5* 9.2*  HCT 32.6*   < > 31.0*  --   --  29.2* 29.1* 27.0*  PLT 268  --  277  --   --  220 218  --   APTT 33  --   --   --   --   --   --   --   LABPROT 21.8*  --   --   --  20.4*  --  19.6*  --   INR 2.0*  --   --   --  1.8*  --  1.7*  --   CREATININE 10.45*   < > 6.78* 6.60*  --  4.08* 3.60*  --   TROPONINIHS 41*  --   --   --   --   --   --   --    < > = values in this interval not displayed.    Estimated Creatinine Clearance: 24.5 mL/min (A) (by C-G formula based on SCr of 3.6 mg/dL (H)).  Assessment: 69 yo M presented with ARF and hyperkalemia.  Now s/p emergent hemodialysis.  Pt was on warfarin PTA for afib.  INR trending down after doses held on admission 12/8.    PTA dose was 5mg  daily except 7.5mg  on Wednesdays  INR today 1.7  Goal of Therapy:  INR 2-3 Monitor platelets by anticoagulation protocol: Yes   Plan:  Warfarin 7.5 mg x 1 Daily INR  Alanda Slim, PharmD, [place] Clinical Pharmacist Please see AMION for all Pharmacists' Contact Phone Numbers 04/06/2020, 10:49 AM

## 2020-04-06 NOTE — Evaluation (Signed)
Physical Therapy Evaluation Patient Details Name: Rodney [name] MRN: [id number] DOB: 03-05-1951 Today's Date: 04/06/2020   History of Present Illness  Rodney [name] is a 69 y.o. male who presented w/ with shortness of breath, hyperkalemia with EKG changes secondary to acute renal failure. PMHx s/f HTN, PVD, HFrEF, moderate aortic stenosis, A. fib on Coumadin, CAD, CKD stage III, HLD, tobacco use, obesity, left BKA.  Clinical Impression  Pt is close to baseline functioning and should be safe at home with girl friend's assist. There are no further acute PT needs, but he would benefit from continuing his HHPT.  Will sign off at this time.     Follow Up Recommendations Home health PT;Supervision - Intermittent;Other (comment) (continue with present [facility] therapist)    Equipment Recommendations  None recommended by PT    Recommendations for Other Services       Precautions / Restrictions Precautions Precautions: Fall Precaution Comments: watch O2 Saturations Restrictions Other Position/Activity Restrictions: LLE prosthetic is NOT in room      Mobility  Bed Mobility Overal bed mobility: Needs Assistance Bed Mobility: Supine to Sit;Sit to Supine     Supine to sit: Supervision;Modified independent (Device/Increase time) Sit to supine: Supervision;Modified independent (Device/Increase time)   General bed mobility comments: supervision for line management only    Transfers Overall transfer level: Modified independent Equipment used: None (transferred into WC)             General transfer comment: Pt able to complete transfer to and from manual WC and bed without any assistsafely  Ambulation/Gait                Stairs            Wheelchair Mobility    Modified Rankin (Stroke Patients Only)       Balance Overall balance assessment: Modified Independent                                           Pertinent Vitals/Pain       Home Living Family/patient expects to be discharged to:: Private residence Living Arrangements: Spouse/significant other (girlfriend with her own health problems) Available Help at Discharge: Friend(s) (girlfriend) Type of Home: Mobile home Home Access: Ramped entrance     Home Layout: One level Home Equipment: [place] - 2 wheels;Bedside commode;Tub bench;Wheelchair - manual Additional Comments: Pt typically sleeps and functions in living room (uses BSC) and transfers to [facility] and back    Prior Function Level of Independence: Independent with assistive device(s)         Comments: Pt typically transfers independently to [facility] and BSC (his fiance has to hold the [facility] because it will rock - but Pt does not need help physically to transfer. He does not drive anymore (girlfriend DOES help with vehicle transfers) but patient bathes and dresses on his own and is participating in PT working on ambulation with prosthetic     Hand Dominance        Extremity/Trunk Assessment   Upper Extremity Assessment Upper Extremity Assessment: Overall WFL for tasks assessed    Lower Extremity Assessment Lower Extremity Assessment: Overall WFL for tasks assessed    Cervical / Trunk Assessment Cervical / Trunk Assessment: Normal  Communication   Communication: HOH  Cognition Arousal/Alertness: Awake/alert Behavior During Therapy: WFL for tasks assessed/performed (slightly agitated due to lack of sleep) Overall Cognitive Status:  Within Functional Limits for tasks assessed                                        General Comments General comments (skin integrity, edema, etc.): Pt on RA throughout session and SpO2 remained >94% throughout session. Girlfriend present throughout session    Exercises     Assessment/Plan    PT Assessment All further PT needs can be met in the next venue of care  PT Problem List Decreased mobility;Decreased activity tolerance       PT Treatment  Interventions      PT Goals (Current goals can be found in the Care Plan section)  Acute Rehab PT Goals Patient Stated Goal: to get home to dog PT Goal Formulation: All assessment and education complete, DC therapy    Frequency     Barriers to discharge        Co-evaluation PT/OT/SLP Co-Evaluation/Treatment: Yes Reason for Co-Treatment: For patient/therapist safety;To address functional/ADL transfers PT goals addressed during session: Mobility/safety with mobility         AM-PAC PT "6 Clicks" Mobility  Outcome Measure Help needed turning from your back to your side while in a flat bed without using bedrails?: None Help needed moving from lying on your back to sitting on the side of a flat bed without using bedrails?: None Help needed moving to and from a bed to a chair (including a wheelchair)?: None Help needed standing up from a chair using your arms (e.g., wheelchair or bedside chair)?: A Lot Help needed to walk in hospital room?: A Lot Help needed climbing 3-5 steps with a railing? : A Lot 6 Click Score: 18    End of Session   Activity Tolerance: Patient tolerated treatment well Patient left: in bed;with call bell/phone within reach;with family/visitor present Nurse Communication: Mobility status PT Visit Diagnosis: Other abnormalities of gait and mobility (R26.89)    Time: 1450-1519 PT Time Calculation (min) (ACUTE ONLY): 29 min   Charges:   PT Evaluation $PT Eval Low Complexity: 1 Low          04/06/2020  Ginger Carne., PT Acute Rehabilitation Services [phone]  (pager) [phone]  (office)  Tessie Fass Nashayla Telleria 04/06/2020, 5:22 PM

## 2020-04-06 NOTE — Progress Notes (Signed)
Transfer NOTE  Patient transferred to 51M fr om 89M Arrival Method: bed Mental Orientation: Alert and oriented x4 Telemetry: yes Assessment: Completed Skin: see notes/flowsheets Iv: 20 gauge in right forearm Pain: 0 Tubes: 0 Safety Measures: Safety Fall Prevention Plan has been given, discussed and signed Admission: Completed [facility] [place] Orientation: Patient has been orientated to the room, unit and staff.  Family: 0  Orders have been reviewed and implemented. Will continue to monitor the patient. Call light has been placed within reach and bed alarm has been activated.    Beatris Ship, RN

## 2020-04-07 LAB — RENAL FUNCTION PANEL
Albumin: 2.5 g/dL — ABNORMAL LOW (ref 3.5–5.0)
Anion gap: 9 (ref 5–15)
BUN: 37 mg/dL — ABNORMAL HIGH (ref 8–23)
CO2: 25 mmol/L (ref 22–32)
Calcium: 8 mg/dL — ABNORMAL LOW (ref 8.9–10.3)
Chloride: 98 mmol/L (ref 98–111)
Creatinine, Ser: 2.65 mg/dL — ABNORMAL HIGH (ref 0.61–1.24)
GFR, Estimated: 25 mL/min — ABNORMAL LOW (ref >60)
Glucose, Bld: 232 mg/dL — ABNORMAL HIGH (ref 70–99)
Phosphorus: 2.2 mg/dL — ABNORMAL LOW (ref 2.5–4.6)
Potassium: 3.6 mmol/L (ref 3.5–5.1)
Sodium: 132 mmol/L — ABNORMAL LOW (ref 135–145)

## 2020-04-07 LAB — CBC
HCT: 28.1 % — ABNORMAL LOW (ref 39.0–52.0)
Hemoglobin: 9.3 g/dL — ABNORMAL LOW (ref 13.0–17.0)
MCH: 29.9 pg (ref 26.0–34.0)
MCHC: 33.1 g/dL (ref 30.0–36.0)
MCV: 90.4 fL (ref 80.0–100.0)
Platelets: 216 10*3/uL (ref 150–400)
RBC: 3.11 MIL/uL — ABNORMAL LOW (ref 4.22–5.81)
RDW: 12.5 % (ref 11.5–15.5)
WBC: 7.4 10*3/uL (ref 4.0–10.5)
nRBC: 0 % (ref 0.0–0.2)

## 2020-04-07 LAB — GLUCOSE, CAPILLARY
Glucose-Capillary: 138 mg/dL — ABNORMAL HIGH (ref 70–99)
Glucose-Capillary: 170 mg/dL — ABNORMAL HIGH (ref 70–99)
Glucose-Capillary: 242 mg/dL — ABNORMAL HIGH (ref 70–99)
Glucose-Capillary: 221 mg/dL — ABNORMAL HIGH (ref 70–99)

## 2020-04-07 LAB — PROTIME-INR
INR: 1.7 — ABNORMAL HIGH (ref 0.8–1.2)
Prothrombin Time: 19.2 seconds — ABNORMAL HIGH (ref 11.4–15.2)

## 2020-04-07 MED ORDER — WARFARIN SODIUM 10 MG PO TABS
10.0000 mg | ORAL_TABLET | Freq: Once | ORAL | Status: AC
  Administered 2020-04-07: 10 mg via ORAL
  Filled 2020-04-07: qty 1

## 2020-04-07 NOTE — Progress Notes (Signed)
Family Medicine Teaching Service Daily Progress Note Intern Pager: [phone]  Patient name: Rodney [name] Medical record number: [id number] Date of birth: Aug 15, 1950 Age: 69 y.o. Gender: male  Primary Care Provider: [name], MD Consultants: cardiology, nephrology  Code Status: Full Code   Pt Overview and Major Events to Date:  Hospital Day: 5 04/03/2020: admitted for Shortness of Breath 12/8/22021: HD line placed   Assessment and Plan: Rodney [name] is a 69 y.o. male who presented w/ with shortness of breath, hyperkalemia with EKG changes secondary to acute renal failure. PMHx s/f HTN, PVD, HFrEF, moderate aortic stenosis, A. fib on Coumadin, CAD, CKD stage III, HLD, tobacco use, obesity, left BKA.  AKI, improving  CKD 3A Emergency dialysis starting 12/8.  Cr continues to improved, 3.6>2.65 this AM.  Continues to autodiurese with UOP 2.7L over last 24 hrs. Nephro labs appear negative.  Nephrology continuing to closely monitor.  Volume status appears euvolemic.  K remains stable. - Nephrology consulted, appreciate recommendations  - holding lasix, spironolactone and losartan  - monitor volume status closely - strict IO  - AM RFP  Dyspnea, resolved  Edema resolved. Patient noted to desat overnight, likely due to undiagnosed OSA. Patient reports no CPAP at home. Satting well on RA this morning. Denies any history of asthma or COPD.  - would benefit from outpatient sleep study  - can continue O2 [place] PRN   Incidental finding on CXR  04/03/20 CXR: Right hilar prominence is noted concerning for possible mass or adenopathy. CT scan of the chest with intravenous contrast is recommended for further evaluation. Unclear if this has been discussed with patient due to his acute issues on admission.  - recommendations in DC summary  - consider when improved kidney function - discuss with pt prior to discharge  T2DM  HLD A1C 7.5 this admission. CBGs low 200s overnight with  fasting this AM 138. Home meds include: Lipitor 80 mg, metformin 1000 mg BID, novolog 70/30 30u breakfast ,15u dinner  - cont home lipitor  - vsSSI + TID AC CBG   HTN  Normotensive. Home meds include losartan 25 mg & lasix 20 mg.  - holding both medications in setting of AKI   CAD   PVD  S/p Left BKA Home meds include: ASA 81  - cont home ASA  - PT/OT consulted   HFrEF Cardiomyopathy  Moderate aortic stenosis  Home meds include: lasix 20 mg, aldactone 25 mg, coreg 12.5 mg  - continue home coreg 12.5 mg BID - hold lasix, spironolactone and losartan   A fib on Coumadin  Remains in NSR. - continue home amiodarone & coumadin per pharmacy  - continue telemetry   Tobacco use  - encourage cessation   Anemia chronic disease  Hgb stable this AM at 9.3. Baseline 9-10.   FEN/GI: Renal diet VTE prophylaxis: Coumadin  Status is: Inpatient  Remains inpatient appropriate because:Ongoing diagnostic testing needed not appropriate for outpatient work up and Inpatient level of care appropriate due to severity of illness   Dispo: The patient is from: Home              Anticipated d/c is to: Home, already has HHPT              Anticipated d/c date is: 3 days              Patient currently is not medically stable to d/c.  Subjective:  No acute events overnight.  Denies complaints.  Objective: Temp:  [97.7 F (36.5 C)-98.8 F (37.1 C)] 98 F (36.7 C) (12/12 0445) Pulse Rate:  [63-83] 70 (12/12 0445) Cardiac Rhythm: Normal sinus rhythm;Bundle branch block (12/11 1901) Resp:  [12-27] 16 (12/12 0445) BP: (122-139)/(62-73) 139/68 (12/12 0445) SpO2:  [66 %-100 %] 95 % (12/12 0445) Weight:  [105.2 kg] 105.2 kg (12/12 0445) Intake/Output      12/11 0701 12/12 0700 12/12 0701 12/13 0700   P.O. 1140    Total Intake(mL/kg) 1140 (10.8)    Urine (mL/kg/hr) 2725 (1.1)    Emesis/NG output 0    Other 0    Stool 0    Blood 0    Total Output 2725    Net -1585         Urine Occurrence  0 x    Stool Occurrence 0 x    Emesis Occurrence 0 x        Physical Exam:  General: 69 y.o. male in NAD Cardio: RRR systolic ejection murmur heard best at right upper sternal border Lungs: CTAB, no wheezing, no rhonchi, no crackles, no IWOB on RA Abdomen: Soft, non-tender to palpation, non-distended, positive bowel sounds Skin: warm and dry Extremities: No edema, left BKA   Laboratory: I have personally read and reviewed all labs and imaging studies.  CBC: Recent Labs  Lab 04/03/20 0956 04/03/20 1156 04/05/20 0811 04/06/20 0341 04/06/20 0441 04/07/20 0236  WBC 13.8*   < > 9.3 7.9  --  7.4  NEUTROABS 12.1*  --   --   --   --   --   HGB 10.9*   < > 10.1* 9.5* 9.2* 9.3*  HCT 33.4*   < > 29.2* 29.1* 27.0* 28.1*  MCV 94.9   < > 90.1 90.7  --  90.4  PLT 322   < > 220 218  --  216   < > = values in this interval not displayed.   CMP: Recent Labs  Lab 04/03/20 1156 04/03/20 1225 04/04/20 0128 04/04/20 1152 04/05/20 0811 04/06/20 0341 04/06/20 0441 04/07/20 0236  NA 136   < > 136   < > 135 134* 134* 132*  K 7.1*   < > 6.0*   < > 3.6 3.5 3.5 3.6  CL 95*   < > 93*   < > 97* 96*  --  98  CO2 17*   < > 26   < > 27 28  --  25  GLUCOSE 218*   < > 137*   < > 160* 166*  --  232*  BUN 117*   < > 69*   < > 36* 38*  --  37*  CREATININE 10.45*   < > 6.78*   < > 4.08* 3.60*  --  2.65*  CALCIUM 9.1   < > 8.3*   < > 8.2* 8.1*  --  8.0*  MG 1.6*  --  1.6*  --   --   --   --   --   PHOS 8.2*  --  5.0*  --   --   --   --  2.2*  ALBUMIN 3.0*  --   --   --   --   --   --  2.5*   < > = values in this interval not displayed.   CBG: Recent Labs  Lab 04/06/20 0735 04/06/20 1215 04/06/20 1622 04/06/20 2038 04/07/20 0643  GLUCAP 125* 165* 248* 226* 138*   Micro: Covid Negative  Imaging/Diagnostic  Tests: No results found.    [place], DO 04/07/2020, 8:34 AM PGY-3, [place] Intern pager: [phone], text pages welcome

## 2020-04-07 NOTE — Plan of Care (Signed)
  Problem: Education: Goal: Knowledge of disease and its progression will improve Outcome: Completed/Met   Problem: Health Behavior/Discharge Planning: Goal: Ability to manage health-related needs will improve Outcome: Completed/Met   Problem: Clinical Measurements: Goal: Complications related to the disease process or treatment will be avoided or minimized Outcome: Completed/Met   Problem: Fluid Volume: Goal: Fluid volume balance will be maintained or improved Outcome: Completed/Met   Problem: Nutritional: Goal: Ability to make appropriate dietary choices will improve Outcome: Completed/Met   Problem: Respiratory: Goal: Respiratory symptoms related to disease process will be avoided Outcome: Completed/Met   Problem: Urinary Elimination: Goal: Progression of disease will be identified and treated Outcome: Completed/Met   Problem: Education: Goal: Knowledge of General Education information will improve Description: Including pain rating scale, medication(s)/side effects and non-pharmacologic comfort measures Outcome: Completed/Met   Problem: Health Behavior/Discharge Planning: Goal: Ability to manage health-related needs will improve Outcome: Completed/Met   Problem: Clinical Measurements: Goal: Ability to maintain clinical measurements within normal limits will improve Outcome: Completed/Met Goal: Will remain free from infection Outcome: Completed/Met Goal: Diagnostic test results will improve Outcome: Completed/Met Goal: Respiratory complications will improve Outcome: Completed/Met Goal: Cardiovascular complication will be avoided Outcome: Completed/Met

## 2020-04-07 NOTE — Progress Notes (Signed)
[facility] Progress Note   69 year old male with a history of HTN, PVD, HFrEF (EF 35-40%), mod aortic stenosis, a fib on coumadin, cardiomyopathy, CAD, CKD stage 3, HLD, tobacco use, obesity and left low/mid knee amputation in 6/21 who presented with worsening SOB on a NRB, labored breathing, bradycardia down to 40-50s with widened complex.  P/w  nausea, vomiting, and diarrhea x1 day with weakness and dyspnea.  He was on Spironolactone and Lasix. He denies obstructive symptoms, hematuria, NSAID use.    Assessment/ Plan:   1) AKI on CKDIIIA, now improved - creatinine has been slowly increasing since 10/03/2019 when int was 0.97. Cr was 1.23 02/15/2020 and then on presentation 10.39 with hyperkalemia. HD start with temp line 12/8, appreciate CCM assistance with line placement.  COVID neg. - HD 12/8 and 12/9 - No obstruction on  renal ultrasound. - Urine studies (ur na and UPC) -> UPC 2 (could likely related to AKI), would recheck once function more towards baseline or when stable -  More likely is the acute injury was secondary to Lasix/spirinolocatone/ARB. Continue to hold -c3, c4, anti gbm wnl. ANA/ANCA neg. -Cr improving, reassuring sign. Will see where his kidney function settles out to -remove dialysis catheter -if continues to improve, can anticipate d/c within the next 24-48 hrs (have to make sure his Na is okay as well) - Renally dose medications for GFR <15. -Avoid nephrotoxic medications including NSAIDs and iodinated intravenous contrast exposure unless the latter is absolutely indicated.  Preferred narcotic agents for pain control are hydromorphone, fentanyl, and methadone. Morphine should not be used. Avoid Baclofen and avoid oral sodium phosphate and magnesium citrate based laxatives / bowel preps. Continue strict Input and Output monitoring. Will monitor the patient closely with you and intervene or adjust therapy as indicated by changes in clinical status/labs   2)  Hyperkalemia with peaked t waves + bradycardia - improved with HD, monitor for now.  3) AFib - Coumadin  4) DM - mgmt per primary service  5) HTN - BP fairly well controlled at this time off meds  6) HFrEF with EF 35-40%. Overall euvolemic on exam. 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  8) Hyponatremia -possible related to low solute intake? If lower, then check serum osm, urine osm, urine lytes  Subjective:   No complaints, no acute events. Out of the ICU. uop 2.7L   Objective:   BP 127/71 (BP Location: Left Arm)   Pulse 74   Temp 98.4 F (36.9 C) (Oral)   Resp 18   Ht 6' (1.829 m)   Wt 105.2 kg   SpO2 96%   BMI 31.45 kg/m   Intake/Output Summary (Last 24 hours) at 04/07/2020 1508 Last data filed at 04/07/2020 1300 Gross per 24 hour  Intake 1020 ml  Output 2575 ml  Net -1555 ml   Weight change:   Physical Exam: GEN: NAD HEENT: dry mucosal membranes LUNGS: CTA B/L no rales, rhonchi or wheezing CV: RRR, No M/R/G ABD: SNDNT +BS  EXT: no edema Neuro: speech clear and coherent, moves ext spontaneously    Imaging: No results found.  Labs: BMET Recent Labs  Lab 04/03/20 1156 04/03/20 1225 04/03/20 1932 04/04/20 0128 04/04/20 1152 04/05/20 0811 04/06/20 0341 04/06/20 0441 04/07/20 0236  NA 136   < > 132* 136 134* 135 134* 134* 132*  K 7.1*   < > 5.5* 6.0* 4.9 3.6 3.5 3.5 3.6  CL 95*  --  91* 93* 92* 97* 96*  --  98  CO2 17*  --  22 26 29 27 28   --  25  GLUCOSE 218*  --  136* 137* 178* 160* 166*  --  232*  BUN 117*  --  57* 69* 66* 36* 38*  --  37*  CREATININE 10.45*  --  6.28* 6.78* 6.60* 4.08* 3.60*  --  2.65*  CALCIUM 9.1  --  8.2* 8.3* 7.9* 8.2* 8.1*  --  8.0*  PHOS 8.2*  --   --  5.0*  --   --   --   --  2.2*   < > = values in this interval not displayed.   CBC Recent Labs  Lab 04/03/20 0956 04/03/20 1156 04/04/20 0128 04/05/20 0811 04/06/20 0341 04/06/20 0441  04/07/20 0236  WBC 13.8*   < > 14.0* 9.3 7.9  --  7.4  NEUTROABS 12.1*  --   --   --   --   --   --   HGB 10.9*   < > 10.2* 10.1* 9.5* 9.2* 9.3*  HCT 33.4*   < > 31.0* 29.2* 29.1* 27.0* 28.1*  MCV 94.9   < > 91.2 90.1 90.7  --  90.4  PLT 322   < > 277 220 218  --  216   < > = values in this interval not displayed.    Medications:    . (feeding supplement) PROSource Plus  30 mL Oral BID BM  . amiodarone  200 mg Oral Daily  . aspirin EC  81 mg Oral Daily  . atorvastatin  80 mg Oral q1800  . carvedilol  12.5 mg Oral BID WC  . Chlorhexidine Gluconate Cloth  6 each Topical Q0600  . heparin sodium (porcine)  4,000 Units Intravenous Once  . insulin aspart  0-6 Units Subcutaneous TID WC  . mouth rinse  15 mL Mouth Rinse BID  . multivitamin  1 tablet Oral QHS  . mupirocin ointment   Nasal BID  . warfarin  10 mg Oral ONCE-1600  . Warfarin - Pharmacist Dosing Inpatient   Does not apply S1115

## 2020-04-07 NOTE — Progress Notes (Signed)
ANTICOAGULATION CONSULT NOTE - Follow-Up Consult  Pharmacy Consult for warfarin Indication: atrial fibrillation  No Known Allergies  Patient Measurements: Height: 6' (182.9 cm) Weight: 105.2 kg (231 lb 14.8 oz) IBW/kg (Calculated) : 77.6  Vital Signs: Temp: 98.4 F (36.9 C) (12/12 0929) Temp Source: Oral (12/12 0929) BP: 127/71 (12/12 0929) Pulse Rate: 74 (12/12 0929)  Labs: Recent Labs    04/05/20 0147 04/05/20 0811 04/05/20 0811 04/06/20 0341 04/06/20 0441 04/07/20 0236  HGB  --  10.1*   < > 9.5* 9.2* 9.3*  HCT  --  29.2*   < > 29.1* 27.0* 28.1*  PLT  --  220  --  218  --  216  LABPROT 20.4*  --   --  19.6*  --  19.2*  INR 1.8*  --   --  1.7*  --  1.7*  CREATININE  --  4.08*  --  3.60*  --  2.65*   < > = values in this interval not displayed.    Estimated Creatinine Clearance: 33.4 mL/min (A) (by C-G formula based on SCr of 2.65 mg/dL (H)).  Assessment: 69 yo M presented with ARF and hyperkalemia.  Now s/p emergent hemodialysis.  Pt was on warfarin PTA for afib.  INR trending down after doses held on admission 12/8.    PTA dose was 5mg  daily except 7.5mg  on Wednesdays  INR is still subtherapeutic at 1.7 despite three doses of 7.5mg  here. We will give one slightly higher dose today. Possible discharge today. If so, resume home dose after the 10mg  today and check INR this coming week.   Goal of Therapy:  INR 2-3 Monitor platelets by anticoagulation protocol: Yes   Plan:  Warfarin 10mg  PO x1 Daily INR If dc home, resume home dose starting tomorrow and check INR next week  Onnie Boer, PharmD, BCIDP, AAHIVP, CPP Infectious Disease Pharmacist 04/07/2020 1:07 PM

## 2020-04-08 LAB — CBC
HCT: 27.8 % — ABNORMAL LOW (ref 39.0–52.0)
Hemoglobin: 9.6 g/dL — ABNORMAL LOW (ref 13.0–17.0)
MCH: 31.2 pg (ref 26.0–34.0)
MCHC: 34.5 g/dL (ref 30.0–36.0)
MCV: 90.3 fL (ref 80.0–100.0)
Platelets: 228 10*3/uL (ref 150–400)
RBC: 3.08 MIL/uL — ABNORMAL LOW (ref 4.22–5.81)
RDW: 12.6 % (ref 11.5–15.5)
WBC: 7.9 10*3/uL (ref 4.0–10.5)
nRBC: 0 % (ref 0.0–0.2)

## 2020-04-08 LAB — CULTURE, BLOOD (ROUTINE X 2)
Culture: NO GROWTH
Culture: NO GROWTH
Special Requests: ADEQUATE
Special Requests: ADEQUATE

## 2020-04-08 LAB — BASIC METABOLIC PANEL
Anion gap: 9 (ref 5–15)
BUN: 38 mg/dL — ABNORMAL HIGH (ref 8–23)
CO2: 23 mmol/L (ref 22–32)
Calcium: 8.2 mg/dL — ABNORMAL LOW (ref 8.9–10.3)
Chloride: 101 mmol/L (ref 98–111)
Creatinine, Ser: 2.19 mg/dL — ABNORMAL HIGH (ref 0.61–1.24)
GFR, Estimated: 32 mL/min — ABNORMAL LOW (ref >60)
Glucose, Bld: 164 mg/dL — ABNORMAL HIGH (ref 70–99)
Potassium: 3.8 mmol/L (ref 3.5–5.1)
Sodium: 133 mmol/L — ABNORMAL LOW (ref 135–145)

## 2020-04-08 LAB — GLUCOSE, CAPILLARY
Glucose-Capillary: 176 mg/dL — ABNORMAL HIGH (ref 70–99)
Glucose-Capillary: 211 mg/dL — ABNORMAL HIGH (ref 70–99)
Glucose-Capillary: 197 mg/dL — ABNORMAL HIGH (ref 70–99)
Glucose-Capillary: 234 mg/dL — ABNORMAL HIGH (ref 70–99)

## 2020-04-08 LAB — PROTIME-INR
INR: 1.9 — ABNORMAL HIGH (ref 0.8–1.2)
Prothrombin Time: 21.2 seconds — ABNORMAL HIGH (ref 11.4–15.2)

## 2020-04-08 MED ORDER — WARFARIN SODIUM 7.5 MG PO TABS
7.5000 mg | ORAL_TABLET | Freq: Once | ORAL | Status: AC
  Administered 2020-04-08: 7.5 mg via ORAL
  Filled 2020-04-08: qty 1

## 2020-04-08 NOTE — Plan of Care (Signed)
  Problem: Activity: Goal: Activity intolerance will improve Outcome: Completed/Met   Problem: Activity: Goal: Risk for activity intolerance will decrease Outcome: Completed/Met   Problem: Nutrition: Goal: Adequate nutrition will be maintained Outcome: Completed/Met   Problem: Elimination: Goal: Will not experience complications related to bowel motility Outcome: Completed/Met Goal: Will not experience complications related to urinary retention Outcome: Completed/Met   Problem: Pain Managment: Goal: General experience of comfort will improve Outcome: Completed/Met   Problem: Safety: Goal: Ability to remain free from injury will improve Outcome: Completed/Met   Problem: Skin Integrity: Goal: Risk for impaired skin integrity will decrease Outcome: Completed/Met

## 2020-04-08 NOTE — Progress Notes (Addendum)
Family Medicine Teaching Service Daily Progress Note Intern Pager: [phone]  Patient name: Rodney [name] Medical record number: [id number] Date of birth: July 11, 1950 Age: 69 y.o. Gender: male  Primary Care Provider: [name], MD Consultants: Cardiology, nephrology Code Status: Full  Pt Overview and Major Events to Date:  04/03/2020: admitted for Shortness of Breath 12/8: HD line placed  12/12: HD line removed  Assessment and Plan: Rodney [name] is a 69 y.o. male who presented w/ with shortness of breath, hyperkalemia with EKG changes secondary to acute renal failure. PMHx s/f HTN, PVD, HFrEF, moderate aortic stenosis, A. fib on Coumadin, CAD, CKD stage III, HLD, tobacco use, obesity, left BKA.  AKI, improving  CKD 3A Emergency dialysis 12/8-12.  Cr continues to improved, 3.6>2.65>2.29 this AM.  Continues to autodiurese with net output -1,725 mL over last 24 hrs. Nephro labs appear negative.  Nephrology continuing to closely monitor.  Volume status appears euvolemic.  Potassium remains stable. - Nephrology consulted: Safe to discharge if creatinine continues downtrending tomorrow morning - holding lasix, spironolactone and losartan  - monitor volume status closely - strict IO  - AM RFP  Incidental finding on CXR  04/03/20 CXR: Right hilar prominence is noted concerning for possible mass or adenopathy. CT scan of the chest with intravenous contrast is recommended for further evaluation.  I personally discussed these findings with him this morning.  I relayed to him that we would like to do a CT with contrast of his chest once his creatinine is better normalized and that his PCP will be coordinating this.  He vocalized understanding and had no questions. - recommendations in DC summary  - consider when improved kidney function  Dyspnea, resolved  Patient reports no dyspnea overnight.  Says he "is breathing just fine". Patient reports no CPAP at home. Denies any history  of asthma or COPD.  - would benefit from outpatient sleep study  - can continue O2 [place] PRN   T2DM  HLD A1C 7.5 this admission. CBGs low 200s overnight with fasting this AM 176. Home meds include: Lipitor 80 mg, metformin 1000 mg BID, novolog 70/30 30u breakfast ,15u dinner  - cont home lipitor  - vsSSI + TID AC CBG   HTN  Patient continues to be normotensive.  BP last 24 hours 111-140/63-80, last 140/72 with pulse 69.  Home meds include losartan 25 mg & lasix 20 mg.  - holding both medications in setting of AKI   CAD  PVD  S/p Left BKA Home meds include: ASA 81  - cont home ASA  - PT/OT consulted   HFrEF Cardiomyopathy  Moderate aortic stenosis  Home meds include: lasix 20 mg, aldactone 25 mg, coreg 12.5 mg  - continue home coreg 12.5 mg BID - hold lasix, spironolactone and losartan   A fib on Coumadin  Remains in NSR. - continue home amiodarone & coumadin per pharmacy  - continue telemetry   Tobacco use  - encourage cessation   Anemia chronic disease  Hgb stable this AM at 9.3. Baseline 9-10.   FEN/GI: Renal diet VTE prophylaxis: Coumadin   Status is: Inpatient  Remains inpatient appropriate because:Inpatient level of care appropriate due to severity of illness   Dispo: The patient is from: Home              Anticipated d/c is to: Home              Anticipated d/c date is: 1 day  Patient currently is not medically stable to d/c.     Subjective:  Patient was found resting comfortably in bed watching television this morning.  He reports doing quite well overnight and has no complaints.  Reports no pain and no dyspnea.  Says that he is ready to discharge home as soon as possible.   I personally discussed these findings with him this morning.  I relayed to him that we would like to do a CT with contrast of his chest once his creatinine is better normalized and that his PCP will be coordinating this.  He vocalized understanding and had no  questions.  Objective: Temp:  [98 F (36.7 C)-98.8 F (37.1 C)] 98 F (36.7 C) (12/13 1000) Pulse Rate:  [65-69] 69 (12/13 1000) Resp:  [18] 18 (12/13 1000) BP: (111-140)/(63-80) 140/72 (12/13 1000) SpO2:  [96 %-98 %] 97 % (12/13 1000) Weight:  [105.2 kg] 105.2 kg (12/12 2055) Physical Exam: General: Awake, alert, oriented, no acute distress Cardiovascular: Regular rate and rhythm, S1 and S2 auscultated, no murmurs appreciated Respiratory: Clear to auscultation bilaterally  Laboratory: Recent Labs  Lab 04/06/20 0341 04/06/20 0441 04/07/20 0236 04/08/20 0401  WBC 7.9  --  7.4 7.9  HGB 9.5* 9.2* 9.3* 9.6*  HCT 29.1* 27.0* 28.1* 27.8*  PLT 218  --  216 228   Recent Labs  Lab 04/03/20 1156 04/03/20 1225 04/06/20 0341 04/06/20 0441 04/07/20 0236 04/08/20 0401  NA 136   < > 134* 134* 132* 133*  K 7.1*   < > 3.5 3.5 3.6 3.8  CL 95*   < > 96*  --  98 101  CO2 17*   < > 28  --  25 23  BUN 117*   < > 38*  --  37* 38*  CREATININE 10.45*   < > 3.60*  --  2.65* 2.19*  CALCIUM 9.1   < > 8.1*  --  8.0* 8.2*  PROT 6.0*  --   --   --   --   --   BILITOT 0.8  --   --   --   --   --   ALKPHOS 52  --   --   --   --   --   ALT 22  --   --   --   --   --   AST 27  --   --   --   --   --   GLUCOSE 218*   < > 166*  --  232* 164*   < > = values in this interval not displayed.    Imaging/Diagnostic Tests: No results found.   Ezequiel Essex, MD 04/08/2020, 2:54 PM PGY-1, [place] Intern pager: [phone], text pages welcome

## 2020-04-08 NOTE — Progress Notes (Signed)
ANTICOAGULATION CONSULT NOTE - Follow-Up Consult  Pharmacy Consult for warfarin Indication: atrial fibrillation  No Known Allergies  Patient Measurements: Height: 6' (182.9 cm) Weight: 105.2 kg (231 lb 14.8 oz) IBW/kg (Calculated) : 77.6  Vital Signs: Temp: 98.1 F (36.7 C) (12/13 0500) Temp Source: Oral (12/13 0500) BP: 139/80 (12/13 0500) Pulse Rate: 67 (12/13 0500)  Labs: Recent Labs    04/06/20 0341 04/06/20 0441 04/07/20 0236 04/08/20 0401  HGB 9.5* 9.2* 9.3* 9.6*  HCT 29.1* 27.0* 28.1* 27.8*  PLT 218  --  216 228  LABPROT 19.6*  --  19.2* 21.2*  INR 1.7*  --  1.7* 1.9*  CREATININE 3.60*  --  2.65* 2.19*    Estimated Creatinine Clearance: 40.5 mL/min (A) (by C-G formula based on SCr of 2.19 mg/dL (H)).  Assessment: 69 yo M presented with ARF and hyperkalemia.  Now s/p emergent hemodialysis.  Pt was on warfarin PTA for afib.  INR trending down after doses held on admission 12/8.    PTA dose was 5mg  daily except 7.5mg  on Wednesdays  INR is subtherapeutic at 1.9 but is now trending up. No bleeding noted, Hgb stable in 9s, platelets are normal.     Goal of Therapy:  INR 2-3 Monitor platelets by anticoagulation protocol: Yes   Plan:  Warfarin 7.5 mg PO tonight Daily INR Monitor for s/sx of bleeding  Thank you for involving pharmacy in this patient's care.  Renold Genta, PharmD, BCPS Clinical Pharmacist Clinical phone for 04/08/2020 until 3p is [phone] 04/08/2020 9:40 AM  **Pharmacist phone directory can be found on [email or web address] listed under [place]**

## 2020-04-08 NOTE — TOC Initial Note (Signed)
Transition of Care [facility]) - Initial/Assessment Note    Patient Details  Name: Rodney [name] MRN: [id number] Date of Birth: [birth year]  Transition of Care [facility]) CM/SW Contact:    Bartholomew Crews, RN Phone Number: [phone] 04/08/2020, 4:55 PM  Clinical Narrative:                  Spoke with patient at the bedside. PTA home with girlfriend. Active with [facility] for PT. Patient will need HH PT order for resumption of services. Patient stated that he was being discharged at 10 am tomorrow. He stated that his girlfriend will provide transportation home. TOC following for transition needs.   Expected Discharge Plan: [place] Barriers to Discharge: Continued Medical Work up   Patient Goals and CMS Choice Patient states their goals for this hospitalization and ongoing recovery are:: home with girlfriend CMS Medicare.gov Compare Post Acute Care list provided to:: Patient Choice offered to / list presented to : Patient  Expected Discharge Plan and Services Expected Discharge Plan: [place]   Discharge Planning Services: CM Consult Post Acute Care Choice: [place] arrangements for the past 2 months: Mobile Home                 DME Arranged: N/A DME Agency: NA       HH Arranged: PT HH Agency: [facility] (formerly [employer]) Date [place]: 04/08/20 Time [place]: 1654 Representative spoke with at [place]: [place] Arrangements/Services Living arrangements for the past 2 months: Mobile Home Lives with:: Self,Significant Other Patient language and need for interpreter reviewed:: Yes Do you feel safe going back to the place where you live?: Yes      Need for Family Participation in Patient Care: Yes (Comment) Care giver support system in place?: Yes (comment) Current home services: DME Criminal Activity/Legal Involvement Pertinent to Current Situation/Hospitalization: No -  Comment as needed  Activities of Daily Living      Permission Sought/Granted                  Emotional Assessment Appearance:: Appears stated age Attitude/Demeanor/Rapport: Engaged Affect (typically observed): Accepting Orientation: : Oriented to Self,Oriented to  Time,Oriented to Place,Oriented to Situation Alcohol / Substance Use: Not Applicable Psych Involvement: No (comment)  Admission diagnosis:  Hyperkalemia [E87.5] Renal failure [N19] Acute renal failure (ARF) (HCC) [N17.9] Acute renal failure, unspecified acute renal failure type [facility]) [N17.9] Patient Active Problem List   Diagnosis Date Noted  . Acute renal failure (ARF) ([place]) 04/03/2020  . Cardiomyopathy ([place]) 02/15/2020  . CAD (coronary artery disease) 02/15/2020  . CRI (chronic renal insufficiency), stage 3 (moderate) ([place]) 02/15/2020  . Long term (current) use of anticoagulants 12/29/2019  . S/P BKA (below knee amputation) unilateral, left ([place]) 11/14/2019  . High risk social situation 11/14/2019  . Atrial fibrillation ([place]) 10/03/2019  . Sepsis ([place]) 09/28/2019  . HFrEF (heart failure with reduced ejection fraction) ([place]) 09/28/2019  . Moderate aortic stenosis 09/28/2019  . Anemia in chronic illness 09/27/2019  . PVD (peripheral vascular disease) ([place])   . SOB (shortness of breath) 07/26/2018  . Peripheral edema 02/24/2018  . Umbilical hernia 01/60/1093  . Obesity 10/09/2015  . Tobacco abuse 09/07/2013  . Uncontrolled type 2 diabetes mellitus with diabetic neuropathy, with long-term current use of insulin ([place]) 09/07/2013  . Colon cancer screening 09/07/2013  . Hypercholesteremia 10/28/2010  . ERECTILE DYSFUNCTION 05/22/2009  .  Essential hypertension 01/23/2009   PCP:  [name], MD Pharmacy:   [place], [place] [address] [place] [address] Phone: [phone] Fax: [facility] [place], [place] [address] Phone: [phone] Fax: [phone]  [place] [address] [place]), [place] - [address] [place]) [place] [address] Phone: [phone] Fax: [phone]     Social Determinants of Health (SDOH) Interventions    Readmission Risk Interventions No flowsheet data found.

## 2020-04-08 NOTE — Progress Notes (Signed)
Patient ID: Rodney [name], male   DOB: 1951/03/06, 69 y.o.   MRN: [id number] [facility] Progress Note   Assessment/ Plan:   1. Acute kidney Injury on chronic kidney disease stage IIIa: Indolent rise of creatinine noted prior to admission between June and October of this year with significant and rapid rise since then when creatinine went from 1.2 to 10.4 upon admission.  Initiated on hemodialysis due to concomitant presence of critical hyperkalemia (this was done on 12/8 and 12/9).  Renal ultrasound without evidence of obstruction and serologies to date negative for GN.  Suspected to have had hemodynamically mediated acute kidney injury in the setting of furosemide/Aldactone/ARB use.  Robust urine output overnight with downtrending creatinine noted indicative of renal recovery.  Status post removal of left IJ hemodialysis catheter yesterday without problems.  Anticipate ability to discharge home in the next 24 to 48 hours with outpatient follow-up with PCP/cardiology in 1 week. 2.  Hyponatremia: Secondary to impaired free water handling in the setting of acute kidney injury, continue to monitor with renal recovery and ongoing supportive management/fluid restriction. 3.  Atrial fibrillation: Rate controlled and on anticoagulation with Coumadin. 4.  Hypertension: Blood pressure marginally elevated, continue to monitor off therapy at this time status post hemodialysis.  May be prudent to avoid spironolactone in the future given risk for hyperkalemia with concomitant ARB use. 5.  History of congestive heart failure (EF 35 to 40%): Resume losartan when creatinine is back to baseline and would recommend furosemide only as needed for volume overload.  Subjective:   Reports to be feeling well and denies any overnight problems.   Objective:   BP 140/72   Pulse 69   Temp 98 F (36.7 C) (Oral)   Resp 18   Ht 6' (1.829 m)   Wt 105.2 kg   SpO2 97%   BMI 31.45 kg/m   Intake/Output  Summary (Last 24 hours) at 04/08/2020 1100 Last data filed at 04/08/2020 0544 Gross per 24 hour  Intake 360 ml  Output 2150 ml  Net -1790 ml   Weight change: 0 kg  Physical Exam: Gen: Comfortably resting in bed, watching television CVS: Pulse regular rhythm, normal rate, S1 and S2 normal Resp: Anteriorly clear to auscultation, no distinct rales or rhonchi Abd: Soft, obese, nontender, bowel sounds normal Ext: No lower extremity edema  Imaging: No results found.  Labs: BMET Recent Labs  Lab 04/03/20 1156 04/03/20 1225 04/03/20 1932 04/04/20 0128 04/04/20 1152 04/05/20 0811 04/06/20 0341 04/06/20 0441 04/07/20 0236 04/08/20 0401  NA 136   < > 132* 136 134* 135 134* 134* 132* 133*  K 7.1*   < > 5.5* 6.0* 4.9 3.6 3.5 3.5 3.6 3.8  CL 95*  --  91* 93* 92* 97* 96*  --  98 101  CO2 17*  --  22 26 29 27 28   --  25 23  GLUCOSE 218*  --  136* 137* 178* 160* 166*  --  232* 164*  BUN 117*  --  57* 69* 66* 36* 38*  --  37* 38*  CREATININE 10.45*  --  6.28* 6.78* 6.60* 4.08* 3.60*  --  2.65* 2.19*  CALCIUM 9.1  --  8.2* 8.3* 7.9* 8.2* 8.1*  --  8.0* 8.2*  PHOS 8.2*  --   --  5.0*  --   --   --   --  2.2*  --    < > = values in this interval not displayed.  CBC Recent Labs  Lab 04/03/20 0956 04/03/20 1156 04/05/20 0811 04/06/20 0341 04/06/20 0441 04/07/20 0236 04/08/20 0401  WBC 13.8*   < > 9.3 7.9  --  7.4 7.9  NEUTROABS 12.1*  --   --   --   --   --   --   HGB 10.9*   < > 10.1* 9.5* 9.2* 9.3* 9.6*  HCT 33.4*   < > 29.2* 29.1* 27.0* 28.1* 27.8*  MCV 94.9   < > 90.1 90.7  --  90.4 90.3  PLT 322   < > 220 218  --  216 228   < > = values in this interval not displayed.    Medications:    . (feeding supplement) PROSource Plus  30 mL Oral BID BM  . amiodarone  200 mg Oral Daily  . aspirin EC  81 mg Oral Daily  . atorvastatin  80 mg Oral q1800  . carvedilol  12.5 mg Oral BID WC  . Chlorhexidine Gluconate Cloth  6 each Topical Q0600  . heparin sodium (porcine)  4,000  Units Intravenous Once  . insulin aspart  0-6 Units Subcutaneous TID WC  . mouth rinse  15 mL Mouth Rinse BID  . multivitamin  1 tablet Oral QHS  . mupirocin ointment   Nasal BID  . Warfarin - Pharmacist Dosing Inpatient   Does not apply q1600   Elmarie Shiley, MD 04/08/2020, 11:00 AM

## 2020-04-09 LAB — PROTIME-INR
INR: 2.2 — ABNORMAL HIGH (ref 0.8–1.2)
Prothrombin Time: 23.6 seconds — ABNORMAL HIGH (ref 11.4–15.2)

## 2020-04-09 LAB — COMPREHENSIVE METABOLIC PANEL
ALT: 22 U/L (ref 0–44)
AST: 18 U/L (ref 15–41)
Albumin: 2.7 g/dL — ABNORMAL LOW (ref 3.5–5.0)
Alkaline Phosphatase: 50 U/L (ref 38–126)
Anion gap: 8 (ref 5–15)
BUN: 32 mg/dL — ABNORMAL HIGH (ref 8–23)
CO2: 25 mmol/L (ref 22–32)
Calcium: 8.4 mg/dL — ABNORMAL LOW (ref 8.9–10.3)
Chloride: 101 mmol/L (ref 98–111)
Creatinine, Ser: 1.84 mg/dL — ABNORMAL HIGH (ref 0.61–1.24)
GFR, Estimated: 39 mL/min — ABNORMAL LOW (ref >60)
Glucose, Bld: 192 mg/dL — ABNORMAL HIGH (ref 70–99)
Potassium: 3.9 mmol/L (ref 3.5–5.1)
Sodium: 134 mmol/L — ABNORMAL LOW (ref 135–145)
Total Bilirubin: 0.5 mg/dL (ref 0.3–1.2)
Total Protein: 5.8 g/dL — ABNORMAL LOW (ref 6.5–8.1)

## 2020-04-09 LAB — GLUCOSE, CAPILLARY: Glucose-Capillary: 154 mg/dL — ABNORMAL HIGH (ref 70–99)

## 2020-04-09 MED ORDER — METFORMIN HCL 1000 MG PO TABS: 500.0000 mg | ORAL_TABLET | Freq: Two times a day (BID) | ORAL | 3 refills | Status: DC

## 2020-04-09 MED ORDER — RENA-VITE PO TABS: 1.0000 | ORAL_TABLET | Freq: Every day | ORAL | 0 refills | Status: DC

## 2020-04-09 NOTE — TOC Transition Note (Signed)
Transition of Care [facility]) - CM/SW Discharge Note   Patient Details  Name: Rodney [name] MRN: [id number] Date of Birth: October 18, 1950  Transition of Care [facility]) CM/SW Contact:  Bartholomew Crews, RN Phone Number: [phone] 04/09/2020, 8:45 AM   Clinical Narrative:     Patient to transition home today. HH PT orders received from MD for resumption of services. Helene Kelp, liaison at [facility], notified of discharge orders. No further TOC needs identified.   Final next level of care: [place] Barriers to Discharge: No Barriers Identified   Patient Goals and CMS Choice Patient states their goals for this hospitalization and ongoing recovery are:: home with girlfriend CMS Medicare.gov Compare Post Acute Care list provided to:: Patient Choice offered to / list presented to : Patient  Discharge Placement                       Discharge Plan and Services   Discharge Planning Services: CM Consult Post Acute Care Choice: Home Health          DME Arranged: N/A DME Agency: NA       HH Arranged: PT [place] Agency: [facility] ([facility] [employer]) Date [place]: 04/09/20 Time [place]: [phone] Representative spoke with at [place]: [place] ([place]) Interventions     Readmission Risk Interventions No flowsheet data found.

## 2020-04-09 NOTE — Progress Notes (Signed)
DISCHARGE NOTE HOME BROADY LAFOY to be discharged Home per MD order. Discussed prescriptions and follow up appointments with the patient. Prescriptions given to patient; medication list explained in detail. Patient verbalized understanding.  Skin clean, dry and intact without evidence of skin break down, no evidence of skin tears noted. IV catheter discontinued intact. Site without signs and symptoms of complications. Dressing and pressure applied. Pt denies pain at the site currently. No complaints noted.  Patient free of lines, drains, and wounds.   An After Visit Summary (AVS) was printed and given to the patient. Patient escorted via wheelchair, and discharged home via private auto.  Paulla Fore, RN, BSN

## 2020-04-09 NOTE — Care Management Important Message (Signed)
Important Message  Patient Details  Name: Rodney [name] MRN: [id number] Date of Birth: 11/29/1950   Medicare Important Message Given:  Yes     Barb Merino [place] 04/09/2020, 12:44 PM

## 2020-04-09 NOTE — Discharge Summary (Addendum)
[facility] Discharge Summary  Patient name: Rodney [name] Medical record number: [id number] Date of birth: Aug 18, 1950 Age: 69 y.o. Gender: male Date of Admission: 04/03/2020  Date of Discharge: 04/09/2020 Admitting Physician: Jacky Kindle, MD  Primary Care Provider: [name], MD Consultants: Nephrology  Indication for Hospitalization: Acute renal failure  Discharge Diagnoses/Problem List:  AKI, improving  CKD 3A Incidental finding on CXR  T2DM  HLD HTN  CAD  PVD  S/p Left BKA HFrEF Cardiomyopathy  Moderate aortic stenosis  A fib on Coumadin Tobacco use  Anemia chronic disease  Disposition: Home  Discharge Condition: Stable  Discharge Exam:  General: Awake, alert, oriented Cardiovascular: Regular rate and rhythm, S1 and S2 present, no murmurs auscultated Respiratory: Lung fields clear to auscultation bilaterally Extremities: Moving all extremities spontaneously Neuro: Cranial nerves II through X grossly intact  Brief Hospital Course:  Rodney [name] is a 69 y.o. male that presented in acute renal failure with severe hyperkalemia with EKG changes. PMH significant for HFrEF, CKD III, and Atrial fibrillation on coumadin, tobacco abuse, left low/mid knee amputation.  Acute renal failure in the setting of CKD stage IIIa  Refractory hyperkalemia with EKG changes, resolved Patient presented in acute renal failure with serum creatinine of 10.45, BUN 117, K 6.7, high anion gap metabolic acidosis.  Patient was treated with calcium gluconate, D5/insulin and albuterol therapy, which did not improve the potassium.  Due to severe refractory hyperkalemia with EKG changes, a central venous catheter was placed and patient underwent emergent hemodialysis.  Patient was also noted to have a lactic acidosis after the multiple rounds of albuterol, and patient was also previously on Metformin which could have been contributing to this presentation  as well. No obstruction found on renal ultrasound. During this admission, patient underwent hemodialysis sessions 12/8-12 with subsequent improvement in renal function and electrolytes. Nephrology cleared patient for safe discharge once creatinine down-trended two days in a row. Nephro stated it may be prudent to avoid spironolactone in the future given risk for hyperkalemia with concomitant ARB use, so it was discontinued at discharge with plan for PCP and nephro outpatient evaluation. Nephro also recommended resumption of losartan when creatinine is back to baseline and would recommend furosemide only as needed for volume overload. On day of discharge, creatinine 1.84 and patient had no complaints.   HFrEF Patient was found to be in volume overload and edematous with a history of HFrEF (last EF 30-35); Lasix, spironolactone, and Losartan were held. Coreg 12.5 mg BID was continued.   Interstitial finding on CXR On 12/8, patient found to have right hilar prominence concerning for mass or adenopathy. CT scan of the chest with intravenous contrast is recommended for further evaluation. Did not do during hospitalization due to renal failure. PCP to coordinate once creatinine normalizes and is safe.   Hypertension Home medications of Losartan, spironolactone, and Lasix were held due to kidney injury and concern that they could have caused the injury. Blood pressures were normotensive during most of the admission. Upon discharge, Losartan, spironolactone, and lasix discontinued per nephrology. Follow up appointment with PCP and nephrologist to restart medications at safe interval.   T2DM A1C 7.5 at this admission. Blood sugars were monitored and patient was placed on sliding scale insulin. Patient was discharged with metformin reduced to 500 mg BID. Will follow up with PCP to increase or change medications as appropriate.     Issues for Follow Up:   Close PCP follow-up to monitor renal  function and  volume status  Close PCP follow-up to monitor blood pressures as patient was discharged without home meds, as they likely contributed to his acute renal failure.  PCP to coordinate CT chest with contrast once creatinine values normalize. 04/03/20 CXR w/ Right hilar prominence is noted concerning for possible mass or adenopathy. CT scan of the chest with intravenous contrast is recommended for further evaluation. Did not do during hospitalization due to acute renal failure.   Renally dose medications for GFR <15.  Repeat urine studies (urine Na and UPC)  Monitor blood sugar control, patient was discharged with reduced metformin dose of 500 mg BID.   Nephro recommends avoiding nephrotoxic medications including NSAIDs and iodinated intravenous contrast exposure unless the latter is absolutely indicated.  Preferred narcotic agents for pain control are hydromorphone, fentanyl, and methadone. Morphine should not be used. Avoid Baclofen and avoid oral sodium phosphate and magnesium citrate based laxatives / bowel preps.  HTN: Per nephro, Resume losartan when creatinine is back to baseline and would recommend furosemide only as needed for volume overload. Stopped spironolactone at [employer]   Significant Procedures: None  Significant Labs and Imaging:  Recent Labs  Lab 04/06/20 0341 04/06/20 0441 04/07/20 0236 04/08/20 0401  WBC 7.9  --  7.4 7.9  HGB 9.5* 9.2* 9.3* 9.6*  HCT 29.1* 27.0* 28.1* 27.8*  PLT 218  --  216 228   Recent Labs  Lab 04/03/20 1156 04/03/20 1225 04/04/20 0128 04/04/20 1152 04/05/20 0811 04/06/20 0341 04/06/20 0441 04/07/20 0236 04/08/20 0401 04/09/20 0239  NA 136   < > 136   < > 135 134* 134* 132* 133* 134*  K 7.1*   < > 6.0*   < > 3.6 3.5 3.5 3.6 3.8 3.9  CL 95*   < > 93*   < > 97* 96*  --  98 101 101  CO2 17*   < > 26   < > 27 28  --  25 23 25   GLUCOSE 218*   < > 137*   < > 160* 166*  --  232* 164* 192*  BUN 117*   < > 69*   < > 36* 38*  --  37* 38* 32*   CREATININE 10.45*   < > 6.78*   < > 4.08* 3.60*  --  2.65* 2.19* 1.84*  CALCIUM 9.1   < > 8.3*   < > 8.2* 8.1*  --  8.0* 8.2* 8.4*  MG 1.6*  --  1.6*  --   --   --   --   --   --   --   PHOS 8.2*  --  5.0*  --   --   --   --  2.2*  --   --   ALKPHOS 52  --   --   --   --   --   --   --   --  50  AST 27  --   --   --   --   --   --   --   --  18  ALT 22  --   --   --   --   --   --   --   --  22  ALBUMIN 3.0*  --   --   --   --   --   --  2.5*  --  2.7*   < > = values in this interval not displayed.  PORTABLE CHEST 1 VIEW 04/03/2020 COMPARISON:  October 02, 2019. FINDINGS: Right hilar prominence is concerning for possible mass or adenopathy. No pneumothorax pleural effusion is noted. Right lung is clear. Stable pleural thickening is noted in the left lung laterally. Mild left basilar atelectasis or scarring possibly inflammation is noted. Bony thorax is unremarkable. IMPRESSION: Right hilar prominence is noted concerning for possible mass or adenopathy. CT scan of the chest with intravenous contrast is recommended for further evaluation. Mild left basilar atelectasis or scarring is noted.  PORTABLE CHEST 1 VIEW 04/03/2020 COMPARISON:  Same-day radiograph FINDINGS: Interval placement of left internal jugular approach dual lumen central venous catheter with distal tip terminating at the level of the right atrium. Stable cardiomediastinal contours. Atherosclerotic calcification of the aortic knob. Linear atelectasis within the left mid lung. No pleural effusion or pneumothorax. IMPRESSION: Interval placement of left internal jugular approach central venous catheter with distal tip terminating at the level of the right atrium. No pneumothorax.  RENAL / URINARY TRACT ULTRASOUND COMPLETE 04/03/2020 COMPARISON:  CTA chest 09/28/2019. FINDINGS: Right Kidney renal measurements: 12.1 x 5.5 x 5.0 cm = volume: 171 mL. Echogenicity within normal limits. No mass or  hydronephrosis visualized. Left Kidney renal measurements: 13.0 x 5.4 x 5.7 cm = volume: 210 mL. Echogenicity within normal limits. No mass or hydronephrosis visualized. Bladder: Appears normal for degree of bladder distention. Other: None. IMPRESSION: Normal ultrasound appearance of both kidneys and the urinary bladder.  Results/Tests Pending at Time of Discharge: None  Discharge Medications:  Allergies as of 04/09/2020   No Known Allergies     Medication List    STOP taking these medications   furosemide 20 MG tablet Commonly known as: LASIX   losartan 25 MG tablet Commonly known as: COZAAR   spironolactone 25 MG tablet Commonly known as: ALDACTONE     TAKE these medications   amiodarone 200 MG tablet Commonly known as: PACERONE Take 1 tablet (200 mg total) by mouth daily.   aspirin EC 81 MG tablet Take 1 tablet (81 mg total) by mouth daily. Swallow whole.   atorvastatin 80 MG tablet Commonly known as: LIPITOR TAKE 1 TABLET EVERY DAY   carvedilol 12.5 MG tablet Commonly known as: COREG Take 1 tablet (12.5 mg total) by mouth 2 (two) times daily with a meal.   insulin aspart protamine- aspart (70-30) 100 UNIT/ML injection Commonly known as: NOVOLOG MIX 70/30 Inject 15-30 Units into the skin See admin instructions. Inject 30 units into the skin with breakfast and 15 units with supper- "until eventually replaced by Lantus"   metFORMIN 1000 MG tablet Commonly known as: GLUCOPHAGE Take 0.5 tablets (500 mg total) by mouth 2 (two) times daily with a meal. What changed: See the new instructions.   multivitamin Tabs tablet Take 1 tablet by mouth at bedtime.   ReliOn True Metrix Test Strips test strip Generic drug: glucose blood Use to test blood sugar three times per day.   True Metrix Meter Devi Use to test blood sugar three times daily.   True Metrix Meter w/Device Kit USE AS DIRECTED   TRUEplus Lancets 33G Misc Use to test blood sugar three times per  day.   warfarin 5 MG tablet Commonly known as: COUMADIN Take as directed. If you are unsure how to take this medication, talk to your nurse or doctor. Original instructions: Take 1 tablet (5 mg total) by mouth daily. What changed:   how much to take  when to take this  additional instructions  Discharge Instructions: Please refer to Patient Instructions section of EMR for full details.  Patient was counseled important signs and symptoms that should prompt return to medical care, changes in medications, dietary instructions, activity restrictions, and follow up appointments.   Follow-Up Appointments:  Follow-up Information    [place]. Go on 04/12/2020.   Why: You have a hospital follow up appointment Fri 12/17 at 2:45pm. Please arrive 15 minutes prior to your appointment.  Contact information: [place]       Elmarie Shiley, MD. Schedule an appointment as soon as possible for a visit in 2 week(s).   Specialty: Nephrology Why: Call and make an appointment with the nephrologist (kidney doctor) for 2-4 weeks from now.  Contact information: [place] [address] [phone]        [facility] Follow up.   Specialty: [place] Why: the office will call to schedule physical therapy visits Contact information: [place] [address] [phone]               Ezequiel Essex, MD 04/09/2020, 9:54 AM PGY-1, [place] Upper-Level Resident Addendum   I have independently interviewed and examined the patient. I have discussed the above with the original author and agree with their documentation. My edits for correction/addition/clarification have been made.  [place] Constable, D.O. PGY-3, [facility] 04/09/2020 11:33 AM

## 2020-04-09 NOTE — Plan of Care (Signed)
  Problem: Nutrition Goal: Patient/Family demonstrates understanding of diet Outcome: Completed/Met Goal: Patient will have no more than 5 lb weight change during LOS Outcome: Completed/Met Goal: Patient will utilize adaptive techniques to administer nutrition Outcome: Completed/Met Goal: Patient will verbalize dietary restrictions Outcome: Completed/Met

## 2020-04-09 NOTE — Discharge Instructions (Signed)
Dear Rodney [name],  Thank you for letting us participate in your care. It was so great being part of your care team during your hospitalization. You were admitted to critical care for acute renal failure with high potassium. You received 2 sessions of hemodialysis with improvement. During your stay, your home hypertension medications were held as well as your home diabetic medications.    POST-HOSPITAL & CARE INSTRUCTIONS 1. You have a follow up appointment with your primary care clinic on Friday 12/17 at 2:45pm.  2. Call [facility] to make a nephrology appointment in 2-4 weeks from now.  3. Go to your follow up appointments (listed below) 4. DO NOT take your losartan or lisinopril until directed so by your doctor. You'll need to wait until your kidneys have recovered enough.  5. DO NOT take NSAID-class medications for pain. These include ibuprofen.  6. DO NOT resume furosemide (lasix) daily. This will stress your kidneys. Your doctor will prescribe this medication on a as-needed basis for volume overload.  7. DO NOT take spironolactone. Your primary care and kidney doctors will discuss starting another medication. Spironolactone has the potential to increase your blood potassium too high when used with certain other medications.  8. DO NOT take your metformin. Your doctor will tell you when to restart it, after your kidneys have recovered.  9. Your primary care clinic will coordinate the CT scan of your chest (to look at that right lung nodule) once your kidneys have recovered enough to tolerate CT contrast dye.  10. Avoid oral sodium phosphate and magnesium citrate based laxatives / bowel preps.    DOCTOR'S APPOINTMENT   Future Appointments  Date Time Provider [place]  04/12/2020  2:45 PM Lurline Del, DO [facility]  04/15/2020  3:30 PM [facility] [place] [facility]  06/18/2020  3:20 PM O'Neal, Cassie Freer, MD [facility] [place]. Go on 04/12/2020.   Why: You have a hospital follow up appointment Fri 12/17 at 2:45pm. Please arrive 15 minutes prior to your appointment.  Contact information: [place]       Elmarie Shiley, MD. Schedule an appointment as soon as possible for a visit in 2 week(s).   Specialty: Nephrology Why: Call and make an appointment with the nephrologist (kidney doctor) for 2-4 weeks from now.  Contact information: [address] [place] [address] [phone]               Take care and be well!  [facility]  [place], [place] [address] [phone]     Acute Kidney Injury, Adult  Acute kidney injury is a sudden worsening of kidney function. The kidneys are organs that have several jobs. They filter the blood to remove waste products and extra fluid. They also maintain a healthy balance of minerals and hormones in the body, which helps control blood pressure and keep bones strong. With this condition, your kidneys do not do their jobs as well as they should. This condition ranges from mild to severe. Over time it may develop into long-lasting (chronic) kidney disease. Early detection and treatment may prevent acute kidney injury from developing into a chronic condition. What are the causes? Common causes of this condition include:  A problem with blood flow to the kidneys. This may be caused by: ?  Low blood pressure (hypotension) or shock. ? Blood loss. ? Heart and blood vessel (cardiovascular) disease. ? Severe burns. ? Liver disease.  Direct damage to the kidneys. This may be caused by: ? Certain medicines. ? A kidney infection. ? Poisoning. ? Being around or in contact with toxic substances. ? A surgical wound. ? A hard, direct hit to the kidney area.  A sudden blockage of urine  flow. This may be caused by: ? Cancer. ? Kidney stones. ? An enlarged prostate in males. What are the signs or symptoms? Symptoms of this condition may not be obvious until the condition becomes severe. Symptoms of this condition can include:  Tiredness (lethargy), or difficulty staying awake.  Nausea or vomiting.  Swelling (edema) of the face, legs, ankles, or feet.  Problems with urination, such as: ? Abdominal pain, or pain along the side of your stomach (flank). ? Decreased urine production. ? Decrease in the force of urine flow.  Muscle twitches and cramps, especially in the legs.  Confusion or trouble concentrating.  Loss of appetite.  Fever. How is this diagnosed? This condition may be diagnosed with tests, including:  Blood tests.  Urine tests.  Imaging tests.  A test in which a sample of tissue is removed from the kidneys to be examined under a microscope (kidney biopsy). How is this treated? Treatment for this condition depends on the cause and how severe the condition is. In mild cases, treatment may not be needed. The kidneys may heal on their own. In more severe cases, treatment will involve:  Treating the cause of the kidney injury. This may involve changing any medicines you are taking or adjusting your dosage.  Fluids. You may need specialized IV fluids to balance your body's needs.  Having a catheter placed to drain urine and prevent blockages.  Preventing problems from occurring. This may mean avoiding certain medicines or procedures that can cause further injury to the kidneys. In some cases treatment may also require:  A procedure to remove toxic wastes from the body (dialysis or continuous renal replacement therapy - CRRT).  Surgery. This may be done to repair a torn kidney, or to remove the blockage from the urinary system. Follow these instructions at home: Medicines  Take over-the-counter and prescription medicines only as told by your  health care provider.  Do not take any new medicines without your health care provider's approval. Many medicines can worsen your kidney damage.  Do not take any vitamin and mineral supplements without your health care provider's approval. Many nutritional supplements can worsen your kidney damage. Lifestyle  If your health care provider prescribed changes to your diet, follow them. You may need to decrease the amount of protein you eat.  Achieve and maintain a healthy weight. If you need help with this, ask your health care provider.  Start or continue an exercise plan. Try to exercise at least 30 minutes a day, 5 days a week.  Do not use any tobacco products, such as cigarettes, chewing tobacco, and e-cigarettes. If you need help quitting, ask your health care provider. General instructions  Keep track of your blood pressure. Report changes in your blood pressure as told by your health care provider.  Stay up to date with immunizations. Ask your health care provider which immunizations you need.  Keep all follow-up visits as told by your health care provider. This is important. Where to find more information  American Association of Kidney Patients: BombTimer.gl  National Kidney Foundation: www.kidney.org  American Kidney Fund: https://mathis.com/  Life Options Rehabilitation Program: ? www.lifeoptions.org ? www.kidneyschool.org Contact a health care provider if:  Your symptoms get worse.  You develop new symptoms. Get help right away if:  You develop symptoms of worsening kidney disease, which include: ? Headaches. ? Abnormally dark or light skin. ? Easy bruising. ? Frequent hiccups. ? Chest pain. ? Shortness of breath. ? End of menstruation in women. ? Seizures. ? Confusion or altered mental status. ? Abdominal or back pain. ? Itchiness.  You have a fever.  Your body is producing less urine.  You have pain or bleeding when you urinate. Summary  Acute kidney  injury is a sudden worsening of kidney function.  Acute kidney injury can be caused by problems with blood flow to the kidneys, direct damage to the kidneys, and sudden blockage of urine flow.  Symptoms of this condition may not be obvious until it becomes severe. Symptoms may include edema, lethargy, confusion, nausea or vomiting, and problems passing urine.  This condition can usually be diagnosed with blood tests, urine tests, and imaging tests. Sometimes a kidney biopsy is done to diagnose this condition.  Treatment for this condition often involves treating the underlying cause. It is treated with fluids, medicines, dialysis, diet changes, or surgery. This information is not intended to replace advice given to you by your health care provider. Make sure you discuss any questions you have with your health care provider. Document Revised: 03/26/2017 Document Reviewed: 04/03/2016 Elsevier Patient Education  [place].    Hyperkalemia Hyperkalemia is when you have too much potassium in your blood. Potassium helps your body in many ways, but having too much can cause problems. If there is too much potassium in your blood, it can affect how your heart works. Potassium is normally removed from your body by your kidneys. Many things can cause the amount in your blood to be high. Medicines and other treatments can be used to bring the amount to a normal level. Treatment may need to be done in the hospital. Follow these instructions at home:   Take over-the-counter and prescription medicines only as told by your doctor.  Do not take any of the following unless your doctor says it is okay: ? Supplements. ? Natural products. ? Herbs. ? Vitamins.  Limit how much alcohol you drink as told by your doctor.  Do not use drugs. If you need help quitting, ask your doctor.  If you have kidney disease, you may need to follow a low-potassium diet. A food specialist (dietitian) can help  you.  Keep all follow-up visits as told by your doctor. This is important. Contact a doctor if:  Your heartbeat is not regular or is very slow.  You feel dizzy (light-headed).  You feel weak.  You feel sick to your stomach (nauseous).  You have tingling in your hands or feet.  You lose feeling (have numbness) in your hands or feet. Get help right away if:  You are short of breath.  You have chest pain.  You pass out (faint).  You cannot move your muscles. Summary  Hyperkalemia is when you have too much potassium in your blood.  Take over-the-counter and prescription medicines only as told by your doctor.  Limit how much alcohol you drink as told by your doctor.  Contact a doctor if your heartbeat is not regular. This information is not intended to replace advice given to you by your health care provider. Make sure you discuss any  questions you have with your health care provider. Document Revised: 03/29/2017 Document Reviewed: 03/29/2017 Elsevier Patient Education  [place].

## 2020-04-11 DIAGNOSIS — I110 Hypertensive heart disease with heart failure: Secondary | ICD-10-CM | POA: Diagnosis not present

## 2020-04-11 DIAGNOSIS — I484 Atypical atrial flutter: Secondary | ICD-10-CM | POA: Diagnosis not present

## 2020-04-11 DIAGNOSIS — I350 Nonrheumatic aortic (valve) stenosis: Secondary | ICD-10-CM | POA: Diagnosis not present

## 2020-04-11 DIAGNOSIS — E1140 Type 2 diabetes mellitus with diabetic neuropathy, unspecified: Secondary | ICD-10-CM | POA: Diagnosis not present

## 2020-04-11 DIAGNOSIS — I4891 Unspecified atrial fibrillation: Secondary | ICD-10-CM | POA: Diagnosis not present

## 2020-04-11 DIAGNOSIS — E1151 Type 2 diabetes mellitus with diabetic peripheral angiopathy without gangrene: Secondary | ICD-10-CM | POA: Diagnosis not present

## 2020-04-11 DIAGNOSIS — I5021 Acute systolic (congestive) heart failure: Secondary | ICD-10-CM | POA: Diagnosis not present

## 2020-04-11 DIAGNOSIS — Z4781 Encounter for orthopedic aftercare following surgical amputation: Secondary | ICD-10-CM | POA: Diagnosis not present

## 2020-04-11 DIAGNOSIS — D649 Anemia, unspecified: Secondary | ICD-10-CM | POA: Diagnosis not present

## 2020-04-11 NOTE — Progress Notes (Deleted)
    SUBJECTIVE:   CHIEF COMPLAINT / HPI:   Hospital follow-up-acute renal failure: During the hospitalization the patient presented in acute renal failure with serum creatinine of 10.45, BUN of 117, potassium of 6.7 with a high anion gap metabolic acidosis.  Baseline creatinine of around 1.0.  Patient ended up undergoing emergent hemodialysis.  Today the patient states***.  Recommendations Per the discharge summary: -On 12/8 the patient was found to have a right hilar prominence concerning for mass or adenopathy on chest x-ray, CT scan was not performed due to the recommendation to use IV contrast and the fact the patient was hospitalized for renal failure.  Recommend coordinating CT with contrast once the creatinine normalizes -Recommend restarting hypertensive medications as appropriate -We will check a BMP for renal function, will also monitor volume status -Monitor blood pressure -Renally dose medications for GFR less than 15 -Repeat urine sodium and UPC -Monitor blood sugar as the patient was discharged on Metformin 500 mg twice daily -Avoid NSAIDs and iodinated IV contrast unless there is an absolute need with preferred narcotic agent for pain of hydromorphone, fentanyl, methadone as well as recommendation to afford baclofen and avoid sodium phosphate and magnesium citrate based laxatives -Per nephro resume losartan when creatinine is back to baseline and use furosemide only when needed for volume overload.  Spironolactone was stopped at discharge Patient is a 69 year old male presenting today for follow-up after being discharged from the hospital for acute renal failure.  PERTINENT  PMH / PSH: ***  OBJECTIVE:   There were no vitals taken for this visit.   General: NAD, pleasant, able to participate in exam Cardiac: RRR, no murmurs. Respiratory: CTAB, normal effort Abdomen: Bowel sounds present, nontender, nondistended, no hepatosplenomegaly. Extremities: no edema or  cyanosis. Psych: Normal affect and mood  ASSESSMENT/PLAN:   No problem-specific Assessment & Plan notes found for this encounter.   Assessment: 69 year old male presents for hospital follow-up after being admitted for acute renal failure.  Per the discharge summary Recommendations at this point were to monitor his creatinine to see if it had returned to his baseline which was around 1.0.  I also recommended frequent follow-up to monitor blood pressure as several of his medications were stopped, renally dose medications for GFR less than 15 as needed, repeat urine sodium and UPC, and monitor blood sugars he was discharged home if Forman 500 mg twice daily.  Per nephrology note we can resume losartan once creatinine is back to baseline and use furosemide only when needed for volume overload.  Patient at this time*** Plan: -We will check urine sodium and urine protein creatinine ratio -We will check a BMP -If patient's creatinine is back to his baseline we will resume losartan -recommend patient do frequent follow-up visits with his PCP to ensure that his other health items are being addressed  Lurline Del, [place]    This note was prepared using Dragon voice recognition software and may include unintentional dictation errors due to the inherent limitations of voice recognition software.

## 2020-04-12 ENCOUNTER — Ambulatory Visit: Payer: Medicare PPO | Admitting: Family Medicine

## 2020-04-15 ENCOUNTER — Telehealth: Payer: Self-pay | Admitting: Orthopedic Surgery

## 2020-04-15 NOTE — Telephone Encounter (Signed)
Rodney [name] called from [email or web address] . Following up on faxed orders. Please call back at [phone].

## 2020-04-16 DIAGNOSIS — Z4781 Encounter for orthopedic aftercare following surgical amputation: Secondary | ICD-10-CM | POA: Diagnosis not present

## 2020-04-16 DIAGNOSIS — E1140 Type 2 diabetes mellitus with diabetic neuropathy, unspecified: Secondary | ICD-10-CM | POA: Diagnosis not present

## 2020-04-16 DIAGNOSIS — I4891 Unspecified atrial fibrillation: Secondary | ICD-10-CM | POA: Diagnosis not present

## 2020-04-16 DIAGNOSIS — I5021 Acute systolic (congestive) heart failure: Secondary | ICD-10-CM | POA: Diagnosis not present

## 2020-04-16 DIAGNOSIS — I484 Atypical atrial flutter: Secondary | ICD-10-CM | POA: Diagnosis not present

## 2020-04-16 DIAGNOSIS — I350 Nonrheumatic aortic (valve) stenosis: Secondary | ICD-10-CM | POA: Diagnosis not present

## 2020-04-16 DIAGNOSIS — I110 Hypertensive heart disease with heart failure: Secondary | ICD-10-CM | POA: Diagnosis not present

## 2020-04-16 DIAGNOSIS — D649 Anemia, unspecified: Secondary | ICD-10-CM | POA: Diagnosis not present

## 2020-04-16 DIAGNOSIS — E1151 Type 2 diabetes mellitus with diabetic peripheral angiopathy without gangrene: Secondary | ICD-10-CM | POA: Diagnosis not present

## 2020-04-23 DIAGNOSIS — E1151 Type 2 diabetes mellitus with diabetic peripheral angiopathy without gangrene: Secondary | ICD-10-CM | POA: Diagnosis not present

## 2020-04-23 DIAGNOSIS — D649 Anemia, unspecified: Secondary | ICD-10-CM | POA: Diagnosis not present

## 2020-04-23 DIAGNOSIS — I4891 Unspecified atrial fibrillation: Secondary | ICD-10-CM | POA: Diagnosis not present

## 2020-04-23 DIAGNOSIS — E1140 Type 2 diabetes mellitus with diabetic neuropathy, unspecified: Secondary | ICD-10-CM | POA: Diagnosis not present

## 2020-04-23 DIAGNOSIS — I5021 Acute systolic (congestive) heart failure: Secondary | ICD-10-CM | POA: Diagnosis not present

## 2020-04-23 DIAGNOSIS — I350 Nonrheumatic aortic (valve) stenosis: Secondary | ICD-10-CM | POA: Diagnosis not present

## 2020-04-23 DIAGNOSIS — I110 Hypertensive heart disease with heart failure: Secondary | ICD-10-CM | POA: Diagnosis not present

## 2020-04-23 DIAGNOSIS — I484 Atypical atrial flutter: Secondary | ICD-10-CM | POA: Diagnosis not present

## 2020-04-23 DIAGNOSIS — Z4781 Encounter for orthopedic aftercare following surgical amputation: Secondary | ICD-10-CM | POA: Diagnosis not present

## 2020-04-24 ENCOUNTER — Telehealth: Payer: Self-pay

## 2020-04-24 NOTE — Telephone Encounter (Signed)
Mark from [employer] for PT verbal orders as follows:  2 time(s) weekly for 4 week(s), then 1 time(s) weekly for 4 week(s)  Verbal orders given per [facility] protocol  Talbot Grumbling, RN

## 2020-04-25 DIAGNOSIS — M6281 Muscle weakness (generalized): Secondary | ICD-10-CM | POA: Diagnosis not present

## 2020-04-25 DIAGNOSIS — E1151 Type 2 diabetes mellitus with diabetic peripheral angiopathy without gangrene: Secondary | ICD-10-CM | POA: Diagnosis not present

## 2020-04-25 DIAGNOSIS — I5022 Chronic systolic (congestive) heart failure: Secondary | ICD-10-CM | POA: Diagnosis not present

## 2020-04-25 DIAGNOSIS — Z4781 Encounter for orthopedic aftercare following surgical amputation: Secondary | ICD-10-CM | POA: Diagnosis not present

## 2020-04-25 DIAGNOSIS — R2681 Unsteadiness on feet: Secondary | ICD-10-CM | POA: Diagnosis not present

## 2020-04-25 DIAGNOSIS — I130 Hypertensive heart and chronic kidney disease with heart failure and stage 1 through stage 4 chronic kidney disease, or unspecified chronic kidney disease: Secondary | ICD-10-CM | POA: Diagnosis not present

## 2020-04-25 DIAGNOSIS — E1140 Type 2 diabetes mellitus with diabetic neuropathy, unspecified: Secondary | ICD-10-CM | POA: Diagnosis not present

## 2020-04-25 DIAGNOSIS — N1831 Chronic kidney disease, stage 3a: Secondary | ICD-10-CM | POA: Diagnosis not present

## 2020-04-25 DIAGNOSIS — Z89512 Acquired absence of left leg below knee: Secondary | ICD-10-CM | POA: Diagnosis not present

## 2020-04-25 DIAGNOSIS — I482 Chronic atrial fibrillation, unspecified: Secondary | ICD-10-CM | POA: Diagnosis not present

## 2020-04-25 DIAGNOSIS — I484 Atypical atrial flutter: Secondary | ICD-10-CM | POA: Diagnosis not present

## 2020-04-25 DIAGNOSIS — E1122 Type 2 diabetes mellitus with diabetic chronic kidney disease: Secondary | ICD-10-CM | POA: Diagnosis not present

## 2020-04-30 DIAGNOSIS — E1122 Type 2 diabetes mellitus with diabetic chronic kidney disease: Secondary | ICD-10-CM | POA: Diagnosis not present

## 2020-04-30 DIAGNOSIS — E1140 Type 2 diabetes mellitus with diabetic neuropathy, unspecified: Secondary | ICD-10-CM | POA: Diagnosis not present

## 2020-04-30 DIAGNOSIS — I484 Atypical atrial flutter: Secondary | ICD-10-CM | POA: Diagnosis not present

## 2020-04-30 DIAGNOSIS — N1831 Chronic kidney disease, stage 3a: Secondary | ICD-10-CM | POA: Diagnosis not present

## 2020-04-30 DIAGNOSIS — E1151 Type 2 diabetes mellitus with diabetic peripheral angiopathy without gangrene: Secondary | ICD-10-CM | POA: Diagnosis not present

## 2020-04-30 DIAGNOSIS — I5022 Chronic systolic (congestive) heart failure: Secondary | ICD-10-CM | POA: Diagnosis not present

## 2020-04-30 DIAGNOSIS — Z4781 Encounter for orthopedic aftercare following surgical amputation: Secondary | ICD-10-CM | POA: Diagnosis not present

## 2020-04-30 DIAGNOSIS — I130 Hypertensive heart and chronic kidney disease with heart failure and stage 1 through stage 4 chronic kidney disease, or unspecified chronic kidney disease: Secondary | ICD-10-CM | POA: Diagnosis not present

## 2020-04-30 DIAGNOSIS — I482 Chronic atrial fibrillation, unspecified: Secondary | ICD-10-CM | POA: Diagnosis not present

## 2020-05-01 ENCOUNTER — Encounter: Payer: Self-pay | Admitting: Family Medicine

## 2020-05-01 ENCOUNTER — Other Ambulatory Visit: Payer: Self-pay

## 2020-05-01 ENCOUNTER — Telehealth: Payer: Self-pay | Admitting: Family Medicine

## 2020-05-01 ENCOUNTER — Ambulatory Visit: Payer: Medicare PPO | Admitting: Family Medicine

## 2020-05-01 VITALS — BP 114/60 | HR 68 | Ht 72.0 in | Wt 232.5 lb

## 2020-05-01 DIAGNOSIS — I739 Peripheral vascular disease, unspecified: Secondary | ICD-10-CM | POA: Diagnosis not present

## 2020-05-01 DIAGNOSIS — Z794 Long term (current) use of insulin: Secondary | ICD-10-CM | POA: Diagnosis not present

## 2020-05-01 DIAGNOSIS — IMO0002 Reserved for concepts with insufficient information to code with codable children: Secondary | ICD-10-CM

## 2020-05-01 DIAGNOSIS — I2510 Atherosclerotic heart disease of native coronary artery without angina pectoris: Secondary | ICD-10-CM

## 2020-05-01 DIAGNOSIS — Z23 Encounter for immunization: Secondary | ICD-10-CM | POA: Diagnosis not present

## 2020-05-01 DIAGNOSIS — I5020 Unspecified systolic (congestive) heart failure: Secondary | ICD-10-CM | POA: Diagnosis not present

## 2020-05-01 DIAGNOSIS — I10 Essential (primary) hypertension: Secondary | ICD-10-CM

## 2020-05-01 DIAGNOSIS — N183 Chronic kidney disease, stage 3 unspecified: Secondary | ICD-10-CM

## 2020-05-01 DIAGNOSIS — E1165 Type 2 diabetes mellitus with hyperglycemia: Secondary | ICD-10-CM

## 2020-05-01 DIAGNOSIS — E1140 Type 2 diabetes mellitus with diabetic neuropathy, unspecified: Secondary | ICD-10-CM

## 2020-05-01 NOTE — Patient Instructions (Signed)
I think you are doing well. Yes, flu shot and pneumonia shot today. See me in two months to recheck the diabetes. I will call call with the lab test results.

## 2020-05-01 NOTE — Telephone Encounter (Signed)
Pt walked in provided Covid-19 Vaccination Record Card to be added to chart. Copy placed in white team folder.

## 2020-05-02 ENCOUNTER — Encounter: Payer: Self-pay | Admitting: Family Medicine

## 2020-05-02 DIAGNOSIS — I130 Hypertensive heart and chronic kidney disease with heart failure and stage 1 through stage 4 chronic kidney disease, or unspecified chronic kidney disease: Secondary | ICD-10-CM | POA: Diagnosis not present

## 2020-05-02 DIAGNOSIS — I484 Atypical atrial flutter: Secondary | ICD-10-CM | POA: Diagnosis not present

## 2020-05-02 DIAGNOSIS — E1140 Type 2 diabetes mellitus with diabetic neuropathy, unspecified: Secondary | ICD-10-CM | POA: Diagnosis not present

## 2020-05-02 DIAGNOSIS — E1151 Type 2 diabetes mellitus with diabetic peripheral angiopathy without gangrene: Secondary | ICD-10-CM | POA: Diagnosis not present

## 2020-05-02 DIAGNOSIS — Z4781 Encounter for orthopedic aftercare following surgical amputation: Secondary | ICD-10-CM | POA: Diagnosis not present

## 2020-05-02 DIAGNOSIS — I5022 Chronic systolic (congestive) heart failure: Secondary | ICD-10-CM | POA: Diagnosis not present

## 2020-05-02 DIAGNOSIS — I482 Chronic atrial fibrillation, unspecified: Secondary | ICD-10-CM | POA: Diagnosis not present

## 2020-05-02 DIAGNOSIS — N1831 Chronic kidney disease, stage 3a: Secondary | ICD-10-CM | POA: Diagnosis not present

## 2020-05-02 DIAGNOSIS — E1122 Type 2 diabetes mellitus with diabetic chronic kidney disease: Secondary | ICD-10-CM | POA: Diagnosis not present

## 2020-05-02 LAB — BASIC METABOLIC PANEL
BUN/Creatinine Ratio: 23 (ref 10–24)
BUN: 36 mg/dL — ABNORMAL HIGH (ref 8–27)
CO2: 23 mmol/L (ref 20–29)
Calcium: 9 mg/dL (ref 8.6–10.2)
Chloride: 105 mmol/L (ref 96–106)
Creatinine, Ser: 1.55 mg/dL — ABNORMAL HIGH (ref 0.76–1.27)
GFR calc Af Amer: 52 mL/min/{1.73_m2} — ABNORMAL LOW (ref >59)
GFR calc non Af Amer: 45 mL/min/{1.73_m2} — ABNORMAL LOW (ref >59)
Glucose: 111 mg/dL — ABNORMAL HIGH (ref 65–99)
Potassium: 4.6 mmol/L (ref 3.5–5.2)
Sodium: 141 mmol/L (ref 134–144)

## 2020-05-02 LAB — TSH: TSH: 5.32 u[IU]/mL — ABNORMAL HIGH (ref 0.450–4.500)

## 2020-05-02 NOTE — Assessment & Plan Note (Signed)
Seems euvolemic.  BP is lowish and creat unknown.  Will not restart arb yet.

## 2020-05-02 NOTE — Assessment & Plan Note (Signed)
Given his PVD and previous amputation, will refer to podiatry for future foot care.

## 2020-05-02 NOTE — Progress Notes (Signed)
    SUBJECTIVE:   CHIEF COMPLAINT / HPI:   FU multiple problems. Hospitalized 04/03/20 with acute renal failure requiring temporary dialysis.  Felt to be secondary to a combo of dehydration and nephrotoxic drugs.  He is eating and drinking normally.  Feels well.  HFrEF.  On appropriate meds  Not on ace or arb due to recent renal failure.  Denies dyspnea or leg swelling.  DM with neuropathy.  S/P bka.  Asks me to trim tonails on remaining right leg.  He has improved his diet and working to lose weight.  A1C in hospital OK  A fib on coumadin.  No palpitations, bruising or bleeding.     P  OBJECTIVE:   BP 114/60   Pulse 68   Ht 6' (1.829 m)   Wt 232 lb 8 oz (105.5 kg)   SpO2 97%   BMI 31.53 kg/m   Weight loss noted.  Weight today with his new leg prosthesis.   Lungs clear Cardiac RRR without m or g Abd benign. Ext 1+ edema of right leg Trimmed great and 2nd toenails.  ASSESSMENT/PLAN:   Essential hypertension Well controled, no change  Uncontrolled type 2 diabetes mellitus with diabetic neuropathy, with long-term current use of insulin (HCC) Assume good control.  Due for A1C repeat in 2 months.    PVD (peripheral vascular disease) ([place]) Given his PVD and previous amputation, will refer to podiatry for future foot care.  HFrEF (heart failure with reduced ejection fraction) (HCC) Seems euvolemic.  BP is lowish and creat unknown.  Will not restart arb yet.  CAD (coronary artery disease) No chest pain.  On appropriate meds.  CRI (chronic renal insufficiency), stage 3 (moderate) (HCC) Recheck creat.  Seems to be nicely recovered from ARF.     [name], MD [place]

## 2020-05-02 NOTE — Assessment & Plan Note (Signed)
Well controled, no change

## 2020-05-02 NOTE — Assessment & Plan Note (Signed)
No chest pain.  On appropriate meds.

## 2020-05-02 NOTE — Assessment & Plan Note (Signed)
Recheck creat.  Seems to be nicely recovered from ARF.

## 2020-05-02 NOTE — Assessment & Plan Note (Signed)
Assume good control.  Due for A1C repeat in 2 months.

## 2020-05-07 ENCOUNTER — Other Ambulatory Visit: Payer: Self-pay | Admitting: Internal Medicine

## 2020-05-07 DIAGNOSIS — I130 Hypertensive heart and chronic kidney disease with heart failure and stage 1 through stage 4 chronic kidney disease, or unspecified chronic kidney disease: Secondary | ICD-10-CM | POA: Diagnosis not present

## 2020-05-07 DIAGNOSIS — N1831 Chronic kidney disease, stage 3a: Secondary | ICD-10-CM | POA: Diagnosis not present

## 2020-05-07 DIAGNOSIS — I5022 Chronic systolic (congestive) heart failure: Secondary | ICD-10-CM | POA: Diagnosis not present

## 2020-05-07 DIAGNOSIS — E1151 Type 2 diabetes mellitus with diabetic peripheral angiopathy without gangrene: Secondary | ICD-10-CM | POA: Diagnosis not present

## 2020-05-07 DIAGNOSIS — E1140 Type 2 diabetes mellitus with diabetic neuropathy, unspecified: Secondary | ICD-10-CM | POA: Diagnosis not present

## 2020-05-07 DIAGNOSIS — I484 Atypical atrial flutter: Secondary | ICD-10-CM | POA: Diagnosis not present

## 2020-05-07 DIAGNOSIS — E1122 Type 2 diabetes mellitus with diabetic chronic kidney disease: Secondary | ICD-10-CM | POA: Diagnosis not present

## 2020-05-07 DIAGNOSIS — Z4781 Encounter for orthopedic aftercare following surgical amputation: Secondary | ICD-10-CM | POA: Diagnosis not present

## 2020-05-07 DIAGNOSIS — I482 Chronic atrial fibrillation, unspecified: Secondary | ICD-10-CM | POA: Diagnosis not present

## 2020-05-09 DIAGNOSIS — Z4781 Encounter for orthopedic aftercare following surgical amputation: Secondary | ICD-10-CM | POA: Diagnosis not present

## 2020-05-09 DIAGNOSIS — I484 Atypical atrial flutter: Secondary | ICD-10-CM | POA: Diagnosis not present

## 2020-05-09 DIAGNOSIS — I482 Chronic atrial fibrillation, unspecified: Secondary | ICD-10-CM | POA: Diagnosis not present

## 2020-05-09 DIAGNOSIS — E1140 Type 2 diabetes mellitus with diabetic neuropathy, unspecified: Secondary | ICD-10-CM | POA: Diagnosis not present

## 2020-05-09 DIAGNOSIS — I130 Hypertensive heart and chronic kidney disease with heart failure and stage 1 through stage 4 chronic kidney disease, or unspecified chronic kidney disease: Secondary | ICD-10-CM | POA: Diagnosis not present

## 2020-05-09 DIAGNOSIS — E1122 Type 2 diabetes mellitus with diabetic chronic kidney disease: Secondary | ICD-10-CM | POA: Diagnosis not present

## 2020-05-09 DIAGNOSIS — E1151 Type 2 diabetes mellitus with diabetic peripheral angiopathy without gangrene: Secondary | ICD-10-CM | POA: Diagnosis not present

## 2020-05-09 DIAGNOSIS — I5022 Chronic systolic (congestive) heart failure: Secondary | ICD-10-CM | POA: Diagnosis not present

## 2020-05-09 DIAGNOSIS — N1831 Chronic kidney disease, stage 3a: Secondary | ICD-10-CM | POA: Diagnosis not present

## 2020-05-21 DIAGNOSIS — E1122 Type 2 diabetes mellitus with diabetic chronic kidney disease: Secondary | ICD-10-CM | POA: Diagnosis not present

## 2020-05-21 DIAGNOSIS — E1140 Type 2 diabetes mellitus with diabetic neuropathy, unspecified: Secondary | ICD-10-CM | POA: Diagnosis not present

## 2020-05-21 DIAGNOSIS — I484 Atypical atrial flutter: Secondary | ICD-10-CM | POA: Diagnosis not present

## 2020-05-21 DIAGNOSIS — Z4781 Encounter for orthopedic aftercare following surgical amputation: Secondary | ICD-10-CM | POA: Diagnosis not present

## 2020-05-21 DIAGNOSIS — I5022 Chronic systolic (congestive) heart failure: Secondary | ICD-10-CM | POA: Diagnosis not present

## 2020-05-21 DIAGNOSIS — I482 Chronic atrial fibrillation, unspecified: Secondary | ICD-10-CM | POA: Diagnosis not present

## 2020-05-21 DIAGNOSIS — E1151 Type 2 diabetes mellitus with diabetic peripheral angiopathy without gangrene: Secondary | ICD-10-CM | POA: Diagnosis not present

## 2020-05-21 DIAGNOSIS — I130 Hypertensive heart and chronic kidney disease with heart failure and stage 1 through stage 4 chronic kidney disease, or unspecified chronic kidney disease: Secondary | ICD-10-CM | POA: Diagnosis not present

## 2020-05-21 DIAGNOSIS — N1831 Chronic kidney disease, stage 3a: Secondary | ICD-10-CM | POA: Diagnosis not present

## 2020-05-25 DIAGNOSIS — I484 Atypical atrial flutter: Secondary | ICD-10-CM | POA: Diagnosis not present

## 2020-05-25 DIAGNOSIS — E1140 Type 2 diabetes mellitus with diabetic neuropathy, unspecified: Secondary | ICD-10-CM | POA: Diagnosis not present

## 2020-05-25 DIAGNOSIS — I5022 Chronic systolic (congestive) heart failure: Secondary | ICD-10-CM | POA: Diagnosis not present

## 2020-05-25 DIAGNOSIS — N1831 Chronic kidney disease, stage 3a: Secondary | ICD-10-CM | POA: Diagnosis not present

## 2020-05-25 DIAGNOSIS — I130 Hypertensive heart and chronic kidney disease with heart failure and stage 1 through stage 4 chronic kidney disease, or unspecified chronic kidney disease: Secondary | ICD-10-CM | POA: Diagnosis not present

## 2020-05-25 DIAGNOSIS — I482 Chronic atrial fibrillation, unspecified: Secondary | ICD-10-CM | POA: Diagnosis not present

## 2020-05-25 DIAGNOSIS — Z4781 Encounter for orthopedic aftercare following surgical amputation: Secondary | ICD-10-CM | POA: Diagnosis not present

## 2020-05-25 DIAGNOSIS — E1151 Type 2 diabetes mellitus with diabetic peripheral angiopathy without gangrene: Secondary | ICD-10-CM | POA: Diagnosis not present

## 2020-05-25 DIAGNOSIS — E1122 Type 2 diabetes mellitus with diabetic chronic kidney disease: Secondary | ICD-10-CM | POA: Diagnosis not present

## 2020-05-26 DIAGNOSIS — R2681 Unsteadiness on feet: Secondary | ICD-10-CM | POA: Diagnosis not present

## 2020-05-26 DIAGNOSIS — Z4781 Encounter for orthopedic aftercare following surgical amputation: Secondary | ICD-10-CM | POA: Diagnosis not present

## 2020-05-26 DIAGNOSIS — Z89512 Acquired absence of left leg below knee: Secondary | ICD-10-CM | POA: Diagnosis not present

## 2020-05-26 DIAGNOSIS — M6281 Muscle weakness (generalized): Secondary | ICD-10-CM | POA: Diagnosis not present

## 2020-05-29 ENCOUNTER — Other Ambulatory Visit: Payer: Self-pay | Admitting: Family Medicine

## 2020-05-30 DIAGNOSIS — E1140 Type 2 diabetes mellitus with diabetic neuropathy, unspecified: Secondary | ICD-10-CM | POA: Diagnosis not present

## 2020-05-30 DIAGNOSIS — E1122 Type 2 diabetes mellitus with diabetic chronic kidney disease: Secondary | ICD-10-CM | POA: Diagnosis not present

## 2020-05-30 DIAGNOSIS — I484 Atypical atrial flutter: Secondary | ICD-10-CM | POA: Diagnosis not present

## 2020-05-30 DIAGNOSIS — I130 Hypertensive heart and chronic kidney disease with heart failure and stage 1 through stage 4 chronic kidney disease, or unspecified chronic kidney disease: Secondary | ICD-10-CM | POA: Diagnosis not present

## 2020-05-30 DIAGNOSIS — I482 Chronic atrial fibrillation, unspecified: Secondary | ICD-10-CM | POA: Diagnosis not present

## 2020-05-30 DIAGNOSIS — I5022 Chronic systolic (congestive) heart failure: Secondary | ICD-10-CM | POA: Diagnosis not present

## 2020-05-30 DIAGNOSIS — N1831 Chronic kidney disease, stage 3a: Secondary | ICD-10-CM | POA: Diagnosis not present

## 2020-05-30 DIAGNOSIS — Z4781 Encounter for orthopedic aftercare following surgical amputation: Secondary | ICD-10-CM | POA: Diagnosis not present

## 2020-05-30 DIAGNOSIS — E1151 Type 2 diabetes mellitus with diabetic peripheral angiopathy without gangrene: Secondary | ICD-10-CM | POA: Diagnosis not present

## 2020-05-31 ENCOUNTER — Other Ambulatory Visit: Payer: Self-pay

## 2020-05-31 ENCOUNTER — Ambulatory Visit (INDEPENDENT_AMBULATORY_CARE_PROVIDER_SITE_OTHER): Payer: Medicare PPO

## 2020-05-31 DIAGNOSIS — I4819 Other persistent atrial fibrillation: Secondary | ICD-10-CM

## 2020-05-31 DIAGNOSIS — Z7901 Long term (current) use of anticoagulants: Secondary | ICD-10-CM | POA: Diagnosis not present

## 2020-05-31 LAB — POCT INR: INR: 1.5 — AB (ref 2.0–3.0)

## 2020-05-31 NOTE — Patient Instructions (Signed)
Take 1 extra tablet tonight and then Continue taking 1 tablet daily except Wednesday take 1.5 tablets.  INR check in 3 weeks

## 2020-06-04 DIAGNOSIS — I482 Chronic atrial fibrillation, unspecified: Secondary | ICD-10-CM | POA: Diagnosis not present

## 2020-06-04 DIAGNOSIS — N1831 Chronic kidney disease, stage 3a: Secondary | ICD-10-CM | POA: Diagnosis not present

## 2020-06-04 DIAGNOSIS — E1140 Type 2 diabetes mellitus with diabetic neuropathy, unspecified: Secondary | ICD-10-CM | POA: Diagnosis not present

## 2020-06-04 DIAGNOSIS — I5022 Chronic systolic (congestive) heart failure: Secondary | ICD-10-CM | POA: Diagnosis not present

## 2020-06-04 DIAGNOSIS — Z4781 Encounter for orthopedic aftercare following surgical amputation: Secondary | ICD-10-CM | POA: Diagnosis not present

## 2020-06-04 DIAGNOSIS — E1151 Type 2 diabetes mellitus with diabetic peripheral angiopathy without gangrene: Secondary | ICD-10-CM | POA: Diagnosis not present

## 2020-06-04 DIAGNOSIS — E1122 Type 2 diabetes mellitus with diabetic chronic kidney disease: Secondary | ICD-10-CM | POA: Diagnosis not present

## 2020-06-04 DIAGNOSIS — I484 Atypical atrial flutter: Secondary | ICD-10-CM | POA: Diagnosis not present

## 2020-06-04 DIAGNOSIS — I130 Hypertensive heart and chronic kidney disease with heart failure and stage 1 through stage 4 chronic kidney disease, or unspecified chronic kidney disease: Secondary | ICD-10-CM | POA: Diagnosis not present

## 2020-06-11 DIAGNOSIS — I484 Atypical atrial flutter: Secondary | ICD-10-CM | POA: Diagnosis not present

## 2020-06-11 DIAGNOSIS — E1151 Type 2 diabetes mellitus with diabetic peripheral angiopathy without gangrene: Secondary | ICD-10-CM | POA: Diagnosis not present

## 2020-06-11 DIAGNOSIS — N1831 Chronic kidney disease, stage 3a: Secondary | ICD-10-CM | POA: Diagnosis not present

## 2020-06-11 DIAGNOSIS — I482 Chronic atrial fibrillation, unspecified: Secondary | ICD-10-CM | POA: Diagnosis not present

## 2020-06-11 DIAGNOSIS — E1140 Type 2 diabetes mellitus with diabetic neuropathy, unspecified: Secondary | ICD-10-CM | POA: Diagnosis not present

## 2020-06-11 DIAGNOSIS — I130 Hypertensive heart and chronic kidney disease with heart failure and stage 1 through stage 4 chronic kidney disease, or unspecified chronic kidney disease: Secondary | ICD-10-CM | POA: Diagnosis not present

## 2020-06-11 DIAGNOSIS — E1122 Type 2 diabetes mellitus with diabetic chronic kidney disease: Secondary | ICD-10-CM | POA: Diagnosis not present

## 2020-06-11 DIAGNOSIS — Z4781 Encounter for orthopedic aftercare following surgical amputation: Secondary | ICD-10-CM | POA: Diagnosis not present

## 2020-06-11 DIAGNOSIS — I5022 Chronic systolic (congestive) heart failure: Secondary | ICD-10-CM | POA: Diagnosis not present

## 2020-06-16 NOTE — Progress Notes (Deleted)
Cardiology Office Note:   Date:  06/16/2020  NAME:  Rodney [name]    MRN: [id number] DOB:  09/23/1950   PCP:  [name], MD  Cardiologist:  Evalina Field, MD  Electrophysiologist:  None   Referring MD: [name], MD   No chief complaint on file. ***  History of Present Illness:   Rodney [name] is a 70 y.o. male with a hx of systolic HF EF 26-71%, CAD with RCA CTO, CKD III, DM, moderate AS, persistent Afib who presents for follow-up. Had converted back to NSR and on amiodarone. On coumadin due to cost of DOAC. Recent admission 03/2020 for AKI requiring temporary HD and now off.   Problem List 1. Systolic HF, 24-58% 0/12/9831 -LHC with RCA CTO 2. 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 CAD -RCA CTO 4. Moderate Aortic stenosis -09/2019: V max 3.06 m/s, MG 25 mmHG 5. CKD III 6. DM -A1c 7.5 -T chol 91, HDL 20, LDL 48, TG 113 7. Transtibial amputation  Past Medical History: Past Medical History:  Diagnosis Date  . Diabetes mellitus without complication ([place])   . HLD (hyperlipidemia)   . Hypertension     Past Surgical History: Past Surgical History:  Procedure Laterality Date  . AMPUTATION Left 09/28/2019   Procedure: AMPUTATION BELOW KNEE;  Surgeon: Newt Minion, MD;  Location: [place];  Service: Orthopedics;  Laterality: Left;  . CARDIOVERSION N/A 10/05/2019   Procedure: CARDIOVERSION;  Surgeon: Sanda Klein, MD;  Location: [place] [facility];  Service: Cardiovascular;  Laterality: N/A;  . LEFT HEART CATH AND CORONARY ANGIOGRAPHY N/A 10/03/2019   Procedure: LEFT HEART CATH AND CORONARY ANGIOGRAPHY;  Surgeon: Lorretta Harp, MD;  Location: [place] [facility];  Service: Cardiovascular;  Laterality: N/A;  . TEE WITHOUT CARDIOVERSION N/A 10/05/2019   Procedure: TRANSESOPHAGEAL ECHOCARDIOGRAM (TEE);  Surgeon: Sanda Klein, MD;  Location[facility]  ENDOSCOPY;  Service: Cardiovascular;  Laterality: N/A;    Current Medications: No outpatient medications have been marked as taking for the 06/18/20 encounter (Appointment) with O'Neal, Cassie Freer, MD.     Allergies:    Patient has no known allergies.   Social History: Social History   Socioeconomic History  . Marital status: Widowed    Spouse name: Not on file  . Number of children: Not on file  . Years of education: Not on file  . Highest education level: Not on file  Occupational History  . Not on file  Tobacco Use  . Smoking status: Current Every Day Smoker    Packs/day: 0.50    Types: Cigarettes  . Smokeless tobacco: Never Used  Substance and Sexual Activity  . Alcohol use: Yes    Alcohol/week: 6.0 standard drinks    Types: 6 Standard drinks or equivalent per week  . Drug use: No  . Sexual activity: Yes    Partners: Female    Comment: monagamous stable relationship  Other Topics Concern  . Not on file  Social History Narrative  . Not on file   Social Determinants of Health   Financial Resource Strain: Not on file  Food Insecurity: Not on file  Transportation Needs: Not on file  Physical Activity: Not on file  Stress: Not on file  Social Connections: Not on file     Family History: The patient's ***family history includes Alcoholism in his father and mother.  ROS:   All  other ROS reviewed and negative. Pertinent positives noted in the HPI.     EKGs/Labs/Other Studies Reviewed:   The following studies were personally reviewed by me today:  EKG:  EKG is *** ordered today.  The ekg ordered today demonstrates ***, and was personally reviewed by me.   TTE 09/28/2019 1. Assessment of his LV function is difficult. Consider repeat echo with  definity contrast once HR is slower . Marland Kitchen Left ventricular ejection  fraction, by estimation, is 35 to 40%. The left ventricle has moderately  decreased function. The left ventricle  demonstrates global hypokinesis.  Left ventricular diastolic parameters are  indeterminate.  2. Right ventricular systolic function is mildly reduced. The right  ventricular size is normal.  3. Left atrial size was mildly dilated.  4. Right atrial size was mildly dilated.  5. The mitral valve is normal in structure. Mild mitral valve  regurgitation. No evidence of mitral stenosis.  6. The aortic valve is not well visualized in the short axis view but is  likely a 3 leaflet valve .Marland Kitchen The aortic valve is tricuspid. Aortic valve  regurgitation is not visualized. Moderate aortic valve stenosis.   Recent Labs: 04/04/2020: B Natriuretic Peptide 860.6; Magnesium 1.6 04/08/2020: Hemoglobin 9.6; Platelets 228 04/09/2020: ALT 22 05/01/2020: BUN 36; Creatinine, Ser 1.55; Potassium 4.6; Sodium 141; TSH 5.320   Recent Lipid Panel    Component Value Date/Time   CHOL 91 09/26/2019 1736   CHOL 140 02/21/2018 1650   TRIG 113 09/26/2019 1736   HDL 20 (L) 09/26/2019 1736   HDL 33 (L) 02/21/2018 1650   CHOLHDL 4.6 09/26/2019 1736   VLDL 23 09/26/2019 1736   LDLCALC 48 09/26/2019 1736   LDLCALC 79 02/21/2018 1650   LDLDIRECT 92 01/03/2014 1514    Physical Exam:   VS:  There were no vitals taken for this visit.   Wt Readings from Last 3 Encounters:  05/01/20 232 lb 8 oz (105.5 kg)  04/08/20 231 lb 14.8 oz (105.2 kg)  02/15/20 250 lb (113.4 kg)    General: Well nourished, well developed, in no acute distress Head: Atraumatic, normal size  Eyes: PEERLA, EOMI  Neck: Supple, no JVD Endocrine: No thryomegaly Cardiac: Normal S1, S2; RRR; no murmurs, rubs, or gallops Lungs: Clear to auscultation bilaterally, no wheezing, rhonchi or rales  Abd: Soft, nontender, no hepatomegaly  Ext: No edema, pulses 2+ Musculoskeletal: No deformities, BUE and BLE strength normal and equal Skin: Warm and dry, no rashes   Neuro: Alert and oriented to person, place, time, and situation, CNII-XII grossly intact, no focal deficits  Psych: Normal  mood and affect   ASSESSMENT:   Rodney [name] is a 70 y.o. male who presents for the following: No diagnosis found.  PLAN:   There are no diagnoses linked to this encounter.  Disposition: No follow-ups on file.  Medication Adjustments/Labs and Tests Ordered: Current medicines are reviewed at length with the patient today.  Concerns regarding medicines are outlined above.  No orders of the defined types were placed in this encounter.  No orders of the defined types were placed in this encounter.   There are no Patient Instructions on file for this visit.   Time Spent with Patient: I have spent a total of *** minutes with patient reviewing hospital notes, telemetry, EKGs, labs and examining the patient as well as establishing an assessment and plan that was discussed with the patient.  > 50% of time was spent in direct patient care.  Signed, Addison Naegeli. Audie Box, [place]  [address], [place], [place] [address] [phone]  06/16/2020 5:01 PM

## 2020-06-18 ENCOUNTER — Ambulatory Visit: Payer: Medicare PPO | Admitting: Cardiovascular Disease

## 2020-06-18 DIAGNOSIS — I2510 Atherosclerotic heart disease of native coronary artery without angina pectoris: Secondary | ICD-10-CM

## 2020-06-18 DIAGNOSIS — I350 Nonrheumatic aortic (valve) stenosis: Secondary | ICD-10-CM

## 2020-06-18 DIAGNOSIS — Z720 Tobacco use: Secondary | ICD-10-CM

## 2020-06-18 DIAGNOSIS — I5022 Chronic systolic (congestive) heart failure: Secondary | ICD-10-CM

## 2020-06-18 DIAGNOSIS — I4819 Other persistent atrial fibrillation: Secondary | ICD-10-CM

## 2020-06-18 DIAGNOSIS — Z7901 Long term (current) use of anticoagulants: Secondary | ICD-10-CM

## 2020-06-24 DIAGNOSIS — Z4781 Encounter for orthopedic aftercare following surgical amputation: Secondary | ICD-10-CM | POA: Diagnosis not present

## 2020-06-24 DIAGNOSIS — M6281 Muscle weakness (generalized): Secondary | ICD-10-CM | POA: Diagnosis not present

## 2020-06-24 DIAGNOSIS — Z89512 Acquired absence of left leg below knee: Secondary | ICD-10-CM | POA: Diagnosis not present

## 2020-06-24 DIAGNOSIS — R2681 Unsteadiness on feet: Secondary | ICD-10-CM | POA: Diagnosis not present

## 2020-06-26 ENCOUNTER — Ambulatory Visit: Payer: Medicare PPO | Admitting: Family Medicine

## 2020-07-03 ENCOUNTER — Other Ambulatory Visit: Payer: Self-pay

## 2020-07-03 ENCOUNTER — Ambulatory Visit (INDEPENDENT_AMBULATORY_CARE_PROVIDER_SITE_OTHER): Payer: Medicare PPO | Admitting: Family Medicine

## 2020-07-03 ENCOUNTER — Ambulatory Visit (INDEPENDENT_AMBULATORY_CARE_PROVIDER_SITE_OTHER): Payer: Medicare PPO

## 2020-07-03 ENCOUNTER — Encounter: Payer: Self-pay | Admitting: Family Medicine

## 2020-07-03 VITALS — BP 143/70 | HR 72 | Ht 72.0 in | Wt 235.6 lb

## 2020-07-03 DIAGNOSIS — I10 Essential (primary) hypertension: Secondary | ICD-10-CM

## 2020-07-03 DIAGNOSIS — Z794 Long term (current) use of insulin: Secondary | ICD-10-CM

## 2020-07-03 DIAGNOSIS — I5020 Unspecified systolic (congestive) heart failure: Secondary | ICD-10-CM

## 2020-07-03 DIAGNOSIS — N183 Chronic kidney disease, stage 3 unspecified: Secondary | ICD-10-CM

## 2020-07-03 DIAGNOSIS — IMO0002 Reserved for concepts with insufficient information to code with codable children: Secondary | ICD-10-CM

## 2020-07-03 DIAGNOSIS — E1140 Type 2 diabetes mellitus with diabetic neuropathy, unspecified: Secondary | ICD-10-CM | POA: Diagnosis not present

## 2020-07-03 DIAGNOSIS — E1165 Type 2 diabetes mellitus with hyperglycemia: Secondary | ICD-10-CM | POA: Diagnosis not present

## 2020-07-03 DIAGNOSIS — Z23 Encounter for immunization: Secondary | ICD-10-CM

## 2020-07-03 LAB — POCT GLYCOSYLATED HEMOGLOBIN (HGB A1C): Hemoglobin A1C: 6.5 % — AB (ref 4.0–5.6)

## 2020-07-03 MED ORDER — FUROSEMIDE 40 MG PO TABS: 40.0000 mg | ORAL_TABLET | Freq: Every day | ORAL | 3 refills | Status: DC

## 2020-07-03 MED ORDER — LOSARTAN POTASSIUM 25 MG PO TABS: 25.0000 mg | ORAL_TABLET | Freq: Every day | ORAL | 3 refills | Status: DC

## 2020-07-03 NOTE — Patient Instructions (Signed)
You will get two meds from [facility] 1. Furosemide - the fluid pill 2. Losartan to protect your heart and kidneys.    Don't start them both at once. Start the losartan first.  Get your blood drawn 4- 7 days after starting the losartan. I will call the next day with the test results.  Assuming the test is good, you can start taking the furosemide after the phone call  Great job on the diabetes.  You are at goal.  See me in three months.

## 2020-07-04 ENCOUNTER — Encounter: Payer: Self-pay | Admitting: Family Medicine

## 2020-07-04 NOTE — Progress Notes (Signed)
Cardiology Office Note:   Date:  07/05/2020  NAME:  Rodney [name]    MRN: [id number] DOB:  04-10-1951   PCP:  [name], MD  Cardiologist:  Evalina Field, MD   Referring MD: [name], MD   Chief Complaint  Patient presents with  . Congestive Heart Failure    History of Present Illness:   Rodney [name] is a 70 y.o. male with a hx of CHF, Afib, CAD, PAD, HLD, DM who presents for follow-up. Admitted for hyperkalemia in setting of ARB/MRA. Had recurrent Afib, but back in NSR on amiodarone. Needs a follow-up echo. He seems to be doing well.  He was recently started on losartan and Lasix by his primary care physician.  He has some swelling in his right lower extremity.  He overall reports no significant chest pain or shortness of breath.  He seems to be doing much better.  His A1c is well controlled.  His EKG demonstrates that he is maintaining sinus rhythm.  EKG demonstrates heart rate 85 which is normal sinus with nonspecific ST-T changes.  He reports his diabetes is much better controlled.  Most recent A1c was 6.5.  His cholesterol is at goal last year.  He does need repeat echocardiogram.  Overall doing well.  Murmur still consistent with moderate AAS.  Problem List 1. Systolic HF, 78-29% 08/30/2128 -LHC with RCA CTO 2. Atrial fibrillation, persistent (CHADSVASC = 5; CAD, HTN, DM, Age >48, CHF) -TEE/DCCV 10/05/2019 -developed postop after L BKA -recurrence 11/03/2019 -back in NSR 02/15/2020 3. CAD -RCA CTO 4. Moderate Aortic stenosis -09/2019: V max 3.06 m/s, MG 25 mmHG 5. CKD III -GFR 45 6. DM -A1c 6.5 -T chol 91, HDL 20, LDL 48, TG 113 7. Transtibial amputation 8. Hyperkalemia 2/2 AIA 03/2020 -briefly on hemodialysis  9. Tobacco abuse -50 years   Past Medical History: Past Medical History:  Diagnosis Date  . CHF (congestive heart failure) ([place])   . Coronary artery disease   . Diabetes mellitus without complication ([place])   . HLD (hyperlipidemia)    . Hypertension     Past Surgical History: Past Surgical History:  Procedure Laterality Date  . AMPUTATION Left 09/28/2019   Procedure: AMPUTATION BELOW KNEE;  Surgeon: Newt Minion, MD;  Location: [facility];  Service: Orthopedics;  Laterality: Left;  . CARDIAC CATHETERIZATION    . CARDIOVERSION N/A 10/05/2019   Procedure: CARDIOVERSION;  Surgeon: Sanda Klein, MD;  Location: [facility];  Service: Cardiovascular;  Laterality: N/A;  . LEFT HEART CATH AND CORONARY ANGIOGRAPHY N/A 10/03/2019   Procedure: LEFT HEART CATH AND CORONARY ANGIOGRAPHY;  Surgeon: Lorretta Harp, MD;  Location: [facility];  Service: Cardiovascular;  Laterality: N/A;  . TEE WITHOUT CARDIOVERSION N/A 10/05/2019   Procedure: TRANSESOPHAGEAL ECHOCARDIOGRAM (TEE);  Surgeon: Sanda Klein, MD;  Location: [facility];  Service: Cardiovascular;  Laterality: N/A;    Current Medications: No outpatient medications have been marked as taking for the 07/05/20 encounter (Office Visit) with Geralynn Rile, MD.     Allergies:    Patient has no known allergies.   Social History: Social History   Socioeconomic History  . Marital status: Widowed    Spouse name: Not on file  . Number of children: Not on file  . Years of education: Not on file  . Highest education level: Not on file  Occupational History  . Not on file  Tobacco Use  . Smoking status: Current Every Day Smoker  Packs/day: 0.50    Years: 50.00    Pack years: 25.00    Types: Cigarettes  . Smokeless tobacco: Never Used  Substance and Sexual Activity  . Alcohol use: Yes    Alcohol/week: 6.0 standard drinks    Types: 6 Standard drinks or equivalent per week  . Drug use: No  . Sexual activity: Yes    Partners: Female    Comment: monagamous stable relationship  Other Topics Concern  . Not on file  Social History Narrative  . Not on file   Social Determinants of Health   Financial Resource Strain: Not on file  Food Insecurity: Not  on file  Transportation Needs: Not on file  Physical Activity: Not on file  Stress: Not on file  Social Connections: Not on file     Family History: The patient's family history includes Alcoholism in his father and mother.  ROS:   All other ROS reviewed and negative. Pertinent positives noted in the HPI.     EKGs/Labs/Other Studies Reviewed:   The following studies were personally reviewed by me today:  EKG:  EKG is ordered today.  The ekg ordered today demonstrates normal sinus rhythm heart rate 85, no acute ischemic changes or evidence of infarction, and was personally reviewed by me.   LHC 10/03/2019 RCA 100% with L to R collaterals   TTE 09/28/2019 1. Assessment of his LV function is difficult. Consider repeat echo with  definity contrast once HR is slower . Marland Kitchen Left ventricular ejection  fraction, by estimation, is 35 to 40%. The left ventricle has moderately  decreased function. The left ventricle  demonstrates global hypokinesis. Left ventricular diastolic parameters are  indeterminate.  2. Right ventricular systolic function is mildly reduced. The right  ventricular size is normal.  3. Left atrial size was mildly dilated.  4. Right atrial size was mildly dilated.  5. The mitral valve is normal in structure. Mild mitral valve  regurgitation. No evidence of mitral stenosis.  6. The aortic valve is not well visualized in the short axis view but is  likely a 3 leaflet valve .Marland Kitchen The aortic valve is tricuspid. Aortic valve  regurgitation is not visualized. Moderate aortic valve stenosis.   Recent Labs: 04/04/2020: B Natriuretic Peptide 860.6; Magnesium 1.6 04/08/2020: Hemoglobin 9.6; Platelets 228 04/09/2020: ALT 22 05/01/2020: BUN 36; Creatinine, Ser 1.55; Potassium 4.6; Sodium 141; TSH 5.320   Recent Lipid Panel    Component Value Date/Time   CHOL 91 09/26/2019 1736   CHOL 140 02/21/2018 1650   TRIG 113 09/26/2019 1736   HDL 20 (L) 09/26/2019 1736   HDL 33 (L)  02/21/2018 1650   CHOLHDL 4.6 09/26/2019 1736   VLDL 23 09/26/2019 1736   LDLCALC 48 09/26/2019 1736   LDLCALC 79 02/21/2018 1650   LDLDIRECT 92 01/03/2014 1514    Physical Exam:   VS:  BP 126/60 (BP Location: Left Arm, Patient Position: Sitting)   Pulse 85   Ht 6' (1.829 m)   Wt 218 lb (98.9 kg)   SpO2 98%   BMI 29.57 kg/m    Wt Readings from Last 3 Encounters:  07/05/20 218 lb (98.9 kg)  07/03/20 235 lb 9.6 oz (106.9 kg)  05/01/20 232 lb 8 oz (105.5 kg)    General: Well nourished, well developed, in no acute distress Head: Atraumatic, normal size  Eyes: PEERLA, EOMI  Neck: Supple, no JVD Endocrine: No thryomegaly Cardiac: Normal S1, S2; RRR; 2 out of 6 systolic ejection murmur Lungs:  Clear to auscultation bilaterally, no wheezing, rhonchi or rales  Abd: Soft, nontender, no hepatomegaly  Ext: Trace edema in the right lower extremity, status post left leg amputation Musculoskeletal: Left leg BKA Skin: Warm and dry, no rashes   Neuro: Alert and oriented to person, place, time, and situation, CNII-XII grossly intact, no focal deficits  Psych: Normal mood and affect   ASSESSMENT:   Rodney [name] is a 70 y.o. male who presents for the following: 1. Persistent atrial fibrillation ([place])   2. Long term (current) use of anticoagulants   3. Coronary artery disease involving native coronary artery of native heart without angina pectoris   4. Hypercholesteremia   5. Essential hypertension   6. Moderate aortic stenosis   7. CRI (chronic renal insufficiency), stage 3 (moderate) (HCC)   8. PVD (peripheral vascular disease) ([place])     PLAN:   1. Persistent atrial fibrillation ([place]) 2. Long term (current) use of anticoagulants -CHADSVASC = 5; CAD, HTN, DM, Age >72, CHF -TEE/DCCV 10/05/2019 -developed postop after L BKA -recurrence 11/03/2019 -back in NSR 02/15/2020 -Issues with cost of Eliquis and Xarelto.  He is on Coumadin.  Goal INR 2-3.  INR check today. -He had atrial  fibrillation in the setting of systolic heart failure.  He is maintaining sinus rhythm on amiodarone.  I would recommend to continue this for now.  He has no other options for medications.  He said tenuous kidney function and I think Tikosyn would not be a good agent for him.  For now he will continue amiodarone 200 mg daily for rhythm control.  3. Coronary artery disease involving native coronary artery of native heart without angina pectoris -Left heart cath demonstrated RCA CTO.  He has good collateral flow.  He will continue aspirin 81 mg daily.  He is on Lipitor 80 mg daily.  Most recent LDL cholesterol at goal with a value of 48.  4. Hypercholesteremia -Continue Lipitor as above.  5. Essential hypertension -BP well controlled.  6. Moderate aortic stenosis -Moderate aortic stenosis.  Murmur still consistent with this.  Repeat echocardiogram.  7. CRI (chronic renal insufficiency), stage 3 (moderate) (HCC) -Close monitoring of kidney function while on ARB.  8. PVD (peripheral vascular disease) ([place]) -Status post left BKA.  9. Systolic HF -Diagnosed in the setting of sepsis with an EF of 35-40%.  Found to have an occluded RCA with collateral flow.  Overall suspect this was a nonischemic cardiomyopathy in the setting of sepsis and A. fib.  He is on carvedilol.  Recently started on losartan.  We will recheck an echocardiogram. -He has 1+ lower extremity edema in the right lower extremity.  He will restart his Lasix 40 mg daily. -We will see him back in 4 months for further titration of medical therapy.  Overall appears to be doing well.  Disposition: Return in about 4 months (around 11/04/2020).  Medication Adjustments/Labs and Tests Ordered: Current medicines are reviewed at length with the patient today.  Concerns regarding medicines are outlined above.  No orders of the defined types were placed in this encounter.  No orders of the defined types were placed in this  encounter.   Patient Instructions  Medication Instructions:  The current medical regimen is effective;  continue present plan and medications.  *If you need a refill on your cardiac medications before your next appointment, please call your pharmacy*  Testing/Procedures: Echocardiogram - Your physician has requested that you have an echocardiogram. Echocardiography is a painless test  that uses sound waves to create images of your heart. It provides your doctor with information about the size and shape of your heart and how well your heart's chambers and valves are working. This procedure takes approximately one hour. There are no restrictions for this procedure. This will be performed at our [facility] location - [address].    Follow-Up: At [facility], you and your health needs are our priority.  As part of our continuing mission to provide you with exceptional heart care, we have created designated Provider Care Teams.  These Care Teams include your primary Cardiologist (physician) and Advanced Practice Providers (APPs -  Physician Assistants and Nurse Practitioners) who all work together to provide you with the care you need, when you need it.  We recommend signing up for the patient portal called "MyChart".  Sign up information is provided on this After Visit Summary.  MyChart is used to connect with patients for Virtual Visits (Telemedicine).  Patients are able to view lab/test results, encounter notes, upcoming appointments, etc.  Non-urgent messages can be sent to your provider as well.   To learn more about what you can do with MyChart, go to [email or web address].    Your next appointment:   4 month(s)  The format for your next appointment:   In Person  Provider:   Eleonore Chiquito, MD       Time Spent with Patient: I have spent a total of 25 minutes with patient reviewing hospital notes, telemetry, EKGs, labs and examining the patient as well as  establishing an assessment and plan that was discussed with the patient.  > 50% of time was spent in direct patient care.  Signed, Addison Naegeli. Audie Box, MD, [place]  [address], [place], [place] [address] [phone]  07/05/2020 3:45 PM

## 2020-07-04 NOTE — Assessment & Plan Note (Signed)
Stable.  Restart ARB.  Control BP.  Diabetes currently well controled.

## 2020-07-04 NOTE — Assessment & Plan Note (Signed)
Up a bit today.  Restart of furosemide and switch ACE to ARB, low dose.  Both should help BP

## 2020-07-04 NOTE — Progress Notes (Signed)
    SUBJECTIVE:   CHIEF COMPLAINT / HPI:   FU multiple chronic problems. 1. DM  Following diet.  On metformin.  BS doing well.  A1C today is at goal=6.5. 2. HFrEF.  Has mild DOE as he becomes more active - he is increasingly comfortable with his BKA prosthesis.  Also having more swelling in his remaining right foot.  Has been off furosemide and ACE.  ACE was held for AKI on top of CKD.  Last Creat was back to his baseline 3. HBP off ACE.  BP has crept back up.    OBJECTIVE:   BP (!) 143/70   Pulse 72   Ht 6' (1.829 m)   Wt 235 lb 9.6 oz (106.9 kg)   SpO2 97%   BMI 31.95 kg/m   Lungs clear Cardiac RRR without m or g Abd benign Ext Right foot has 2+  Edema.    ASSESSMENT/PLAN:   Essential hypertension Up a bit today.  Restart of furosemide and switch ACE to ARB, low dose.  Both should help BP  CRI (chronic renal insufficiency), stage 3 (moderate) (HCC) Stable.  Restart ARB.  Control BP.  Diabetes currently well controled.  Uncontrolled type 2 diabetes mellitus with diabetic neuropathy, with long-term current use of insulin ([place]) Now well controled.  Next visit consider add GLP1 or SGLT2.  HFrEF (heart failure with reduced ejection fraction) (HCC) Will add ARB and lasix in that order.       [name], MD [place]

## 2020-07-04 NOTE — Assessment & Plan Note (Addendum)
Will add ARB and lasix in that order.  Needs BMP one week after beginning ARB

## 2020-07-04 NOTE — Assessment & Plan Note (Signed)
Now well controled.  Next visit consider add GLP1 or SGLT2.

## 2020-07-05 ENCOUNTER — Other Ambulatory Visit: Payer: Self-pay

## 2020-07-05 ENCOUNTER — Ambulatory Visit (INDEPENDENT_AMBULATORY_CARE_PROVIDER_SITE_OTHER): Payer: Medicare PPO | Admitting: Cardiovascular Disease

## 2020-07-05 ENCOUNTER — Encounter: Payer: Self-pay | Admitting: Cardiovascular Disease

## 2020-07-05 ENCOUNTER — Ambulatory Visit: Payer: Self-pay | Admitting: Podiatry

## 2020-07-05 ENCOUNTER — Ambulatory Visit (INDEPENDENT_AMBULATORY_CARE_PROVIDER_SITE_OTHER): Payer: Medicare PPO

## 2020-07-05 VITALS — BP 126/60 | HR 85 | Ht 72.0 in | Wt 218.0 lb

## 2020-07-05 DIAGNOSIS — I739 Peripheral vascular disease, unspecified: Secondary | ICD-10-CM | POA: Diagnosis not present

## 2020-07-05 DIAGNOSIS — N183 Chronic kidney disease, stage 3 unspecified: Secondary | ICD-10-CM | POA: Diagnosis not present

## 2020-07-05 DIAGNOSIS — I10 Essential (primary) hypertension: Secondary | ICD-10-CM | POA: Diagnosis not present

## 2020-07-05 DIAGNOSIS — Z7901 Long term (current) use of anticoagulants: Secondary | ICD-10-CM | POA: Diagnosis not present

## 2020-07-05 DIAGNOSIS — I4819 Other persistent atrial fibrillation: Secondary | ICD-10-CM | POA: Diagnosis not present

## 2020-07-05 DIAGNOSIS — I2510 Atherosclerotic heart disease of native coronary artery without angina pectoris: Secondary | ICD-10-CM | POA: Diagnosis not present

## 2020-07-05 DIAGNOSIS — I5022 Chronic systolic (congestive) heart failure: Secondary | ICD-10-CM

## 2020-07-05 DIAGNOSIS — I350 Nonrheumatic aortic (valve) stenosis: Secondary | ICD-10-CM | POA: Diagnosis not present

## 2020-07-05 DIAGNOSIS — E7800 Pure hypercholesterolemia, unspecified: Secondary | ICD-10-CM

## 2020-07-05 LAB — POCT INR: INR: 1.5 — AB (ref 2.0–3.0)

## 2020-07-05 NOTE — Patient Instructions (Signed)
Take 2 tablets tonight and then increase to 1 tablet daily except Monday, Wednesday and Friday take 1.5 tablets.  INR check in 3 weeks

## 2020-07-05 NOTE — Patient Instructions (Signed)
Medication Instructions:  The current medical regimen is effective;  continue present plan and medications.  *If you need a refill on your cardiac medications before your next appointment, please call your pharmacy*  Testing/Procedures: Echocardiogram - Your physician has requested that you have an echocardiogram. Echocardiography is a painless test that uses sound waves to create images of your heart. It provides your doctor with information about the size and shape of your heart and how well your heart's chambers and valves are working. This procedure takes approximately one hour. There are no restrictions for this procedure. This will be performed at our [facility] location - [address].    Follow-Up: At [facility], you and your health needs are our priority.  As part of our continuing mission to provide you with exceptional heart care, we have created designated Provider Care Teams.  These Care Teams include your primary Cardiologist (physician) and Advanced Practice Providers (APPs -  Physician Assistants and Nurse Practitioners) who all work together to provide you with the care you need, when you need it.  We recommend signing up for the patient portal called "MyChart".  Sign up information is provided on this After Visit Summary.  MyChart is used to connect with patients for Virtual Visits (Telemedicine).  Patients are able to view lab/test results, encounter notes, upcoming appointments, etc.  Non-urgent messages can be sent to your provider as well.   To learn more about what you can do with MyChart, go to [email or web address].    Your next appointment:   4 month(s)  The format for your next appointment:   In Person  Provider:   Eleonore Chiquito, MD

## 2020-07-06 ENCOUNTER — Other Ambulatory Visit: Payer: Self-pay | Admitting: Internal Medicine

## 2020-07-12 NOTE — Addendum Note (Signed)
Addended by: Wonda Horner on: 07/12/2020 04:33 PM   Modules accepted: Orders

## 2020-07-22 ENCOUNTER — Telehealth: Payer: Self-pay

## 2020-07-22 NOTE — Telephone Encounter (Signed)
[name] calls nurse line wanting clarification on losartan and furosemide. Michaell Cowing he is start losartan ONLY for 1 week and come for a followup lab visit. Once lab and results have been reviewed he may start furosemide if appropriate. Lab visit scheduled for 4/5, the patient plans to start losartan tomorrow 03/29.

## 2020-07-24 DIAGNOSIS — M6281 Muscle weakness (generalized): Secondary | ICD-10-CM | POA: Diagnosis not present

## 2020-07-24 DIAGNOSIS — Z89512 Acquired absence of left leg below knee: Secondary | ICD-10-CM | POA: Diagnosis not present

## 2020-07-24 DIAGNOSIS — R2681 Unsteadiness on feet: Secondary | ICD-10-CM | POA: Diagnosis not present

## 2020-07-24 DIAGNOSIS — Z4781 Encounter for orthopedic aftercare following surgical amputation: Secondary | ICD-10-CM | POA: Diagnosis not present

## 2020-07-26 ENCOUNTER — Telehealth: Payer: Self-pay

## 2020-07-26 NOTE — Telephone Encounter (Signed)
Patients wife calls nurse line reporting he started Losartan on 3/28 (Monday) and woke up this moring with puffy feet. Wife reports he took 20mg  of lasix this morning and she is worried since PCP strictly informed him to wait until labs were drawn. Apt has already been made for 4/5. Per wife the patient denies any pain or SOB. I precepted with Owens Shark who advised no more fluid pills and to not miss lab apt. ED precautions were given to patients wife.

## 2020-07-30 ENCOUNTER — Other Ambulatory Visit: Payer: Medicare PPO

## 2020-07-30 ENCOUNTER — Ambulatory Visit (HOSPITAL_BASED_OUTPATIENT_CLINIC_OR_DEPARTMENT_OTHER): Payer: Medicare PPO

## 2020-07-30 ENCOUNTER — Ambulatory Visit: Payer: Medicare PPO | Admitting: Family Medicine

## 2020-07-30 ENCOUNTER — Other Ambulatory Visit: Payer: Self-pay

## 2020-07-30 ENCOUNTER — Encounter: Payer: Self-pay | Admitting: Family Medicine

## 2020-07-30 ENCOUNTER — Ambulatory Visit (INDEPENDENT_AMBULATORY_CARE_PROVIDER_SITE_OTHER): Payer: Medicare PPO | Admitting: Family Medicine

## 2020-07-30 VITALS — Ht 72.0 in | Wt 241.0 lb

## 2020-07-30 DIAGNOSIS — I4819 Other persistent atrial fibrillation: Secondary | ICD-10-CM | POA: Diagnosis not present

## 2020-07-30 DIAGNOSIS — R609 Edema, unspecified: Secondary | ICD-10-CM | POA: Diagnosis not present

## 2020-07-30 DIAGNOSIS — I5020 Unspecified systolic (congestive) heart failure: Secondary | ICD-10-CM

## 2020-07-30 DIAGNOSIS — L603 Nail dystrophy: Secondary | ICD-10-CM | POA: Insufficient documentation

## 2020-07-30 LAB — ECHOCARDIOGRAM COMPLETE
AR max vel: 0.69 cm2
AV Area VTI: 0.79 cm2
AV Area mean vel: 0.67 cm2
AV Mean grad: 33 mmHg
AV Peak grad: 57.8 mmHg
Ao pk vel: 3.8 m/s
Area-P 1/2: 2.98 cm2
MV M vel: 5.23 m/s
MV Peak grad: 109.4 mmHg
MV VTI: 1.6 cm2
S' Lateral: 3.5 cm

## 2020-07-30 NOTE — Assessment & Plan Note (Signed)
He has extremely curved and thicckened nails. Was referred to podiatry but unfortunately he has misplaced his driver's license, and they will not see him as a patient without ID.

## 2020-07-30 NOTE — Assessment & Plan Note (Signed)
Right lower extremity edema with 1+ pitting but only to ankle area. Vascular status seems intact with palpable pulses. He did get labs today and he tells me his PCP is going to call him tomorrow. I will  Not restart his lasix at this point. He will elevate to hip level in next 48 hours. He will call with any significant worsening or new sx such as fever, purulent d/c

## 2020-07-30 NOTE — Progress Notes (Signed)
    CHIEF COMPLAINT / HPI:  1.Right leg swelling 2. Right great toe blister   1.  PCP working with him re his diuretics and had stopped his lasix. He was to come in today for labs and PCP was going to follow up after labs. When he was here for labs they made Same day appt for concern RLE swelling 2. Noticed blister today. He has no pain from it. Wife noticed it   PERTINENT  PMH / [place]: I have reviewed the patient's medications, allergies, past medical and surgical history, smoking status and updated in the EMR as appropriate. Left BKA with prosthesis   OBJECTIVE:  Ht 6' (1.829 m)   Wt 241 lb (109.3 kg)   BMI 32.69 kg/m  WD overweight , left bka with prosthesis Unsteady on his feet. We transported him from car with wheelchair Right lower extremity 1+ pitting edema to right above ankle. Nails extremely deformed and long. Plantar surface of great toe has blister  That is quarter sized, already ruptured. No pus. nontender Surrounding area is without induration. He has chronic erythema of lower extremity consistent with chronic venous stasis, no sign of cellulitis.  VASC: DP and PT pulse 1+.  Calf is soft.   Procedure: placed layered unna boot material on plantar surface of forefoot. Did not wrap around. Also placed some on dorsum of foot and then a circumferential, 3 layers of very light kerlex  And 2 layers of loosley wrapped coband with the goal of protecting the blister from infection but not compressing the foot.    ASSESSMENT / PLAN:   Peripheral edema  Right lower extremity edema with 1+ pitting but only to ankle area. Vascular status seems intact with palpable pulses. He did get labs today and he tells me his PCP is going to call him tomorrow. I will  Not restart his lasix at this point. He will elevate to hip level in next 48 hours. He will call with any significant worsening or new sx such as fever, purulent d/c  Nail dystrophy He has extremely curved and thicckened nails. Was  referred to podiatry but unfortunately he has misplaced his driver's license, and they will not see him as a patient without ID.   2. Blister on great toe. Biggest concern is that it has ruptured and is now an open source for possible infection so we covered it well. He will be non weight bearing and f/u with me Thursday Dorcas Mcmurray MD

## 2020-07-31 LAB — BASIC METABOLIC PANEL
BUN/Creatinine Ratio: 27 — ABNORMAL HIGH (ref 10–24)
BUN: 40 mg/dL — ABNORMAL HIGH (ref 8–27)
CO2: 21 mmol/L (ref 20–29)
Calcium: 8.7 mg/dL (ref 8.6–10.2)
Chloride: 100 mmol/L (ref 96–106)
Creatinine, Ser: 1.49 mg/dL — ABNORMAL HIGH (ref 0.76–1.27)
Glucose: 141 mg/dL — ABNORMAL HIGH (ref 65–99)
Potassium: 5.2 mmol/L (ref 3.5–5.2)
Sodium: 136 mmol/L (ref 134–144)
eGFR: 51 mL/min/{1.73_m2} — ABNORMAL LOW (ref >59)

## 2020-08-01 ENCOUNTER — Inpatient Hospital Stay (HOSPITAL_COMMUNITY)
Admission: EM | Admit: 2020-08-01 | Discharge: 2020-08-13 | DRG: 253 | Disposition: A | Discharge status: Home Health | Payer: Medicare PPO | Source: Ambulatory Visit | Attending: Family Medicine | Admitting: Family Medicine

## 2020-08-01 ENCOUNTER — Ambulatory Visit (INDEPENDENT_AMBULATORY_CARE_PROVIDER_SITE_OTHER): Payer: Medicare PPO | Admitting: Family Medicine

## 2020-08-01 ENCOUNTER — Other Ambulatory Visit: Payer: Self-pay

## 2020-08-01 VITALS — BP 155/65 | HR 66 | Temp 98.1°F

## 2020-08-01 DIAGNOSIS — E1140 Type 2 diabetes mellitus with diabetic neuropathy, unspecified: Secondary | ICD-10-CM

## 2020-08-01 DIAGNOSIS — D62 Acute posthemorrhagic anemia: Secondary | ICD-10-CM | POA: Diagnosis not present

## 2020-08-01 DIAGNOSIS — I96 Gangrene, not elsewhere classified: Secondary | ICD-10-CM | POA: Diagnosis present

## 2020-08-01 DIAGNOSIS — L03115 Cellulitis of right lower limb: Secondary | ICD-10-CM

## 2020-08-01 DIAGNOSIS — I480 Paroxysmal atrial fibrillation: Secondary | ICD-10-CM | POA: Diagnosis present

## 2020-08-01 DIAGNOSIS — I2510 Atherosclerotic heart disease of native coronary artery without angina pectoris: Secondary | ICD-10-CM | POA: Diagnosis present

## 2020-08-01 DIAGNOSIS — I4891 Unspecified atrial fibrillation: Secondary | ICD-10-CM | POA: Diagnosis present

## 2020-08-01 DIAGNOSIS — E1152 Type 2 diabetes mellitus with diabetic peripheral angiopathy with gangrene: Principal | ICD-10-CM | POA: Diagnosis present

## 2020-08-01 DIAGNOSIS — Z79899 Other long term (current) drug therapy: Secondary | ICD-10-CM

## 2020-08-01 DIAGNOSIS — E669 Obesity, unspecified: Secondary | ICD-10-CM | POA: Diagnosis present

## 2020-08-01 DIAGNOSIS — E7800 Pure hypercholesterolemia, unspecified: Secondary | ICD-10-CM | POA: Diagnosis present

## 2020-08-01 DIAGNOSIS — N179 Acute kidney failure, unspecified: Secondary | ICD-10-CM | POA: Diagnosis not present

## 2020-08-01 DIAGNOSIS — I350 Nonrheumatic aortic (valve) stenosis: Secondary | ICD-10-CM | POA: Diagnosis present

## 2020-08-01 DIAGNOSIS — I5020 Unspecified systolic (congestive) heart failure: Secondary | ICD-10-CM | POA: Diagnosis present

## 2020-08-01 DIAGNOSIS — I5032 Chronic diastolic (congestive) heart failure: Secondary | ICD-10-CM | POA: Diagnosis present

## 2020-08-01 DIAGNOSIS — Z20822 Contact with and (suspected) exposure to covid-19: Secondary | ICD-10-CM | POA: Diagnosis present

## 2020-08-01 DIAGNOSIS — Z7901 Long term (current) use of anticoagulants: Secondary | ICD-10-CM

## 2020-08-01 DIAGNOSIS — E11621 Type 2 diabetes mellitus with foot ulcer: Secondary | ICD-10-CM | POA: Diagnosis present

## 2020-08-01 DIAGNOSIS — Z811 Family history of alcohol abuse and dependence: Secondary | ICD-10-CM

## 2020-08-01 DIAGNOSIS — E785 Hyperlipidemia, unspecified: Secondary | ICD-10-CM | POA: Diagnosis present

## 2020-08-01 DIAGNOSIS — L97519 Non-pressure chronic ulcer of other part of right foot with unspecified severity: Secondary | ICD-10-CM | POA: Diagnosis present

## 2020-08-01 DIAGNOSIS — I10 Essential (primary) hypertension: Secondary | ICD-10-CM | POA: Diagnosis present

## 2020-08-01 DIAGNOSIS — E1149 Type 2 diabetes mellitus with other diabetic neurological complication: Secondary | ICD-10-CM | POA: Diagnosis present

## 2020-08-01 DIAGNOSIS — K219 Gastro-esophageal reflux disease without esophagitis: Secondary | ICD-10-CM | POA: Diagnosis present

## 2020-08-01 DIAGNOSIS — E871 Hypo-osmolality and hyponatremia: Secondary | ICD-10-CM | POA: Diagnosis not present

## 2020-08-01 DIAGNOSIS — Z8739 Personal history of other diseases of the musculoskeletal system and connective tissue: Secondary | ICD-10-CM

## 2020-08-01 DIAGNOSIS — L603 Nail dystrophy: Secondary | ICD-10-CM | POA: Diagnosis present

## 2020-08-01 DIAGNOSIS — Z7982 Long term (current) use of aspirin: Secondary | ICD-10-CM

## 2020-08-01 DIAGNOSIS — L0390 Cellulitis, unspecified: Secondary | ICD-10-CM | POA: Diagnosis present

## 2020-08-01 DIAGNOSIS — I70261 Atherosclerosis of native arteries of extremities with gangrene, right leg: Secondary | ICD-10-CM | POA: Diagnosis present

## 2020-08-01 DIAGNOSIS — E1122 Type 2 diabetes mellitus with diabetic chronic kidney disease: Secondary | ICD-10-CM | POA: Diagnosis present

## 2020-08-01 DIAGNOSIS — Z720 Tobacco use: Secondary | ICD-10-CM | POA: Diagnosis present

## 2020-08-01 DIAGNOSIS — N1831 Chronic kidney disease, stage 3a: Secondary | ICD-10-CM | POA: Diagnosis present

## 2020-08-01 DIAGNOSIS — E11628 Type 2 diabetes mellitus with other skin complications: Secondary | ICD-10-CM | POA: Diagnosis present

## 2020-08-01 DIAGNOSIS — Z89512 Acquired absence of left leg below knee: Secondary | ICD-10-CM

## 2020-08-01 DIAGNOSIS — Z7984 Long term (current) use of oral hypoglycemic drugs: Secondary | ICD-10-CM

## 2020-08-01 DIAGNOSIS — IMO0002 Reserved for concepts with insufficient information to code with codable children: Secondary | ICD-10-CM

## 2020-08-01 DIAGNOSIS — I4819 Other persistent atrial fibrillation: Secondary | ICD-10-CM | POA: Diagnosis present

## 2020-08-01 DIAGNOSIS — E1165 Type 2 diabetes mellitus with hyperglycemia: Secondary | ICD-10-CM | POA: Diagnosis present

## 2020-08-01 DIAGNOSIS — Z6831 Body mass index (BMI) 31.0-31.9, adult: Secondary | ICD-10-CM

## 2020-08-01 DIAGNOSIS — I739 Peripheral vascular disease, unspecified: Secondary | ICD-10-CM | POA: Diagnosis present

## 2020-08-01 DIAGNOSIS — Z794 Long term (current) use of insulin: Secondary | ICD-10-CM

## 2020-08-01 DIAGNOSIS — K5641 Fecal impaction: Secondary | ICD-10-CM | POA: Diagnosis not present

## 2020-08-01 DIAGNOSIS — D649 Anemia, unspecified: Secondary | ICD-10-CM

## 2020-08-01 DIAGNOSIS — F17210 Nicotine dependence, cigarettes, uncomplicated: Secondary | ICD-10-CM | POA: Diagnosis present

## 2020-08-01 DIAGNOSIS — I77810 Thoracic aortic ectasia: Secondary | ICD-10-CM | POA: Diagnosis present

## 2020-08-01 DIAGNOSIS — Z716 Tobacco abuse counseling: Secondary | ICD-10-CM

## 2020-08-01 DIAGNOSIS — I130 Hypertensive heart and chronic kidney disease with heart failure and stage 1 through stage 4 chronic kidney disease, or unspecified chronic kidney disease: Secondary | ICD-10-CM | POA: Diagnosis present

## 2020-08-01 DIAGNOSIS — M60073 Infective myositis, right foot: Secondary | ICD-10-CM | POA: Diagnosis present

## 2020-08-01 DIAGNOSIS — L03119 Cellulitis of unspecified part of limb: Secondary | ICD-10-CM | POA: Diagnosis present

## 2020-08-01 LAB — COMPREHENSIVE METABOLIC PANEL
ALT: 14 U/L (ref 0–44)
AST: 15 U/L (ref 15–41)
Albumin: 2.5 g/dL — ABNORMAL LOW (ref 3.5–5.0)
Alkaline Phosphatase: 56 U/L (ref 38–126)
Anion gap: 5 (ref 5–15)
BUN: 23 mg/dL (ref 8–23)
CO2: 27 mmol/L (ref 22–32)
Calcium: 8.6 mg/dL — ABNORMAL LOW (ref 8.9–10.3)
Chloride: 102 mmol/L (ref 98–111)
Creatinine, Ser: 1.32 mg/dL — ABNORMAL HIGH (ref 0.61–1.24)
GFR, Estimated: 59 mL/min — ABNORMAL LOW (ref >60)
Glucose, Bld: 249 mg/dL — ABNORMAL HIGH (ref 70–99)
Potassium: 4.2 mmol/L (ref 3.5–5.1)
Sodium: 134 mmol/L — ABNORMAL LOW (ref 135–145)
Total Bilirubin: 0.3 mg/dL (ref 0.3–1.2)
Total Protein: 5.7 g/dL — ABNORMAL LOW (ref 6.5–8.1)

## 2020-08-01 LAB — BRAIN NATRIURETIC PEPTIDE: B Natriuretic Peptide: 489.3 pg/mL — ABNORMAL HIGH (ref 0.0–100.0)

## 2020-08-01 LAB — PROTIME-INR
INR: 1.6 — ABNORMAL HIGH (ref 0.8–1.2)
Prothrombin Time: 18.8 seconds — ABNORMAL HIGH (ref 11.4–15.2)

## 2020-08-01 LAB — CBC
HCT: 31.9 % — ABNORMAL LOW (ref 39.0–52.0)
Hemoglobin: 10.5 g/dL — ABNORMAL LOW (ref 13.0–17.0)
MCH: 29.8 pg (ref 26.0–34.0)
MCHC: 32.9 g/dL (ref 30.0–36.0)
MCV: 90.6 fL (ref 80.0–100.0)
Platelets: 255 10*3/uL (ref 150–400)
RBC: 3.52 MIL/uL — ABNORMAL LOW (ref 4.22–5.81)
RDW: 12.8 % (ref 11.5–15.5)
WBC: 8.4 10*3/uL (ref 4.0–10.5)
nRBC: 0 % (ref 0.0–0.2)

## 2020-08-01 LAB — GLUCOSE, CAPILLARY
Glucose-Capillary: 257 mg/dL — ABNORMAL HIGH (ref 70–99)
Glucose-Capillary: 149 mg/dL — ABNORMAL HIGH (ref 70–99)

## 2020-08-01 LAB — RESP PANEL BY RT-PCR (FLU A&B, COVID) ARPGX2
Influenza A by PCR: NEGATIVE
Influenza B by PCR: NEGATIVE
SARS Coronavirus 2 by RT PCR: NEGATIVE

## 2020-08-01 MED ORDER — SODIUM CHLORIDE 0.9 % IV SOLN
2.0000 g | Freq: Three times a day (TID) | INTRAVENOUS | Status: DC
  Administered 2020-08-01 – 2020-08-05 (×11): 2 g via INTRAVENOUS
  Filled 2020-08-01 (×11): qty 2

## 2020-08-01 MED ORDER — WARFARIN - PHARMACIST DOSING INPATIENT: Freq: Every day | Status: DC

## 2020-08-01 MED ORDER — POLYETHYLENE GLYCOL 3350 17 G PO PACK
17.0000 g | PACK | Freq: Every day | ORAL | Status: DC | PRN
  Administered 2020-08-06: 17 g via ORAL
  Filled 2020-08-01: qty 1

## 2020-08-01 MED ORDER — ACETAMINOPHEN 325 MG PO TABS
650.0000 mg | ORAL_TABLET | Freq: Four times a day (QID) | ORAL | Status: DC | PRN
  Administered 2020-08-03 – 2020-08-10 (×7): 650 mg via ORAL
  Filled 2020-08-01 (×7): qty 2

## 2020-08-01 MED ORDER — INSULIN ASPART PROT & ASPART (70-30 MIX) 100 UNIT/ML ~~LOC~~ SUSP
20.0000 [IU] | Freq: Every day | SUBCUTANEOUS | Status: DC
  Administered 2020-08-02 – 2020-08-13 (×11): 20 [IU] via SUBCUTANEOUS
  Filled 2020-08-01 (×2): qty 10

## 2020-08-01 MED ORDER — ENOXAPARIN SODIUM 40 MG/0.4ML ~~LOC~~ SOLN: 40.0000 mg | SUBCUTANEOUS | Status: DC

## 2020-08-01 MED ORDER — CARVEDILOL 12.5 MG PO TABS
12.5000 mg | ORAL_TABLET | Freq: Two times a day (BID) | ORAL | Status: DC
  Administered 2020-08-02 – 2020-08-13 (×21): 12.5 mg via ORAL
  Filled 2020-08-01 (×21): qty 1

## 2020-08-01 MED ORDER — WARFARIN SODIUM 2.5 MG PO TABS
2.5000 mg | ORAL_TABLET | Freq: Once | ORAL | Status: AC
  Administered 2020-08-01: 2.5 mg via ORAL
  Filled 2020-08-01: qty 1

## 2020-08-01 MED ORDER — NICOTINE 14 MG/24HR TD PT24
14.0000 mg | MEDICATED_PATCH | Freq: Every day | TRANSDERMAL | Status: DC
  Administered 2020-08-04 – 2020-08-13 (×8): 14 mg via TRANSDERMAL
  Filled 2020-08-01 (×9): qty 1

## 2020-08-01 MED ORDER — AMIODARONE HCL 200 MG PO TABS
200.0000 mg | ORAL_TABLET | Freq: Every day | ORAL | Status: DC
  Administered 2020-08-02 – 2020-08-13 (×11): 200 mg via ORAL
  Filled 2020-08-01 (×11): qty 1

## 2020-08-01 MED ORDER — FUROSEMIDE 40 MG PO TABS
40.0000 mg | ORAL_TABLET | Freq: Every day | ORAL | Status: DC
  Administered 2020-08-02 – 2020-08-09 (×7): 40 mg via ORAL
  Filled 2020-08-01 (×7): qty 1

## 2020-08-01 MED ORDER — LOSARTAN POTASSIUM 50 MG PO TABS
25.0000 mg | ORAL_TABLET | Freq: Every day | ORAL | Status: DC
  Administered 2020-08-02: 25 mg via ORAL
  Filled 2020-08-01: qty 1

## 2020-08-01 MED ORDER — VANCOMYCIN HCL 1500 MG/300ML IV SOLN
1500.0000 mg | INTRAVENOUS | Status: DC
  Administered 2020-08-01 – 2020-08-04 (×4): 1500 mg via INTRAVENOUS
  Filled 2020-08-01 (×4): qty 300

## 2020-08-01 MED ORDER — SODIUM CHLORIDE 0.9 % IV SOLN: 2.0000 g | Freq: Three times a day (TID) | INTRAVENOUS | Status: DC

## 2020-08-01 MED ORDER — ATORVASTATIN CALCIUM 80 MG PO TABS
80.0000 mg | ORAL_TABLET | Freq: Every day | ORAL | Status: DC
  Administered 2020-08-02 – 2020-08-13 (×11): 80 mg via ORAL
  Filled 2020-08-01 (×11): qty 1

## 2020-08-01 MED ORDER — INSULIN ASPART PROT & ASPART (70-30 MIX) 100 UNIT/ML ~~LOC~~ SUSP
10.0000 [IU] | Freq: Every day | SUBCUTANEOUS | Status: DC
  Administered 2020-08-02 – 2020-08-12 (×8): 10 [IU] via SUBCUTANEOUS
  Filled 2020-08-01 (×2): qty 10

## 2020-08-01 MED ORDER — ASPIRIN EC 81 MG PO TBEC
81.0000 mg | DELAYED_RELEASE_TABLET | Freq: Every day | ORAL | Status: DC
  Administered 2020-08-02 – 2020-08-11 (×9): 81 mg via ORAL
  Filled 2020-08-01 (×9): qty 1

## 2020-08-01 MED ORDER — INSULIN ASPART 100 UNIT/ML ~~LOC~~ SOLN
0.0000 [IU] | Freq: Three times a day (TID) | SUBCUTANEOUS | Status: DC
  Administered 2020-08-03: 2 [IU] via SUBCUTANEOUS
  Administered 2020-08-03: 1 [IU] via SUBCUTANEOUS
  Administered 2020-08-04: 2 [IU] via SUBCUTANEOUS
  Administered 2020-08-04: 1 [IU] via SUBCUTANEOUS
  Administered 2020-08-05 (×2): 2 [IU] via SUBCUTANEOUS
  Administered 2020-08-06 (×2): 1 [IU] via SUBCUTANEOUS
  Administered 2020-08-06: 2 [IU] via SUBCUTANEOUS
  Administered 2020-08-07 – 2020-08-13 (×10): 1 [IU] via SUBCUTANEOUS

## 2020-08-01 MED ORDER — ACETAMINOPHEN 650 MG RE SUPP: 650.0000 mg | Freq: Four times a day (QID) | RECTAL | Status: DC | PRN

## 2020-08-01 NOTE — Patient Instructions (Signed)
We have placed a request for a ospital bed for admission for IV antibiotics.

## 2020-08-01 NOTE — Progress Notes (Signed)
    CHIEF COMPLAINT / HPI: Here for follow-up of right toe blister.  The area of redness on the top of his foot has started to extend up toward the ankle.  He is having more pain.  No fever.   PERTINENT  PMH / PSH: I have reviewed the patient's medications, allergies, past medical and surgical history, smoking status and updated in the EMR as appropriate. Left BKA  OBJECTIVE:  BP (!) 155/65   Pulse 66   Temp 98.1 F (36.7 C)   SpO2 93%   Right foot looks much more puffy and swollen today.  The area of redness has extended halfway up the dorsum of his foot.  The blister area has exfoliated.  When I tried to clean his foot a little bit large piece of skin from the intertrigo's area as well as plantar portion of the foot sloughed off. See photos under media ASSESSMENT / PLAN: Cellulitis now with open wound.  Given that he has complex medical history including left BKA, I think inpatient hospitalization with IV antibiotic therapy and wound consult is the best route to go. Additionally I talked to him about increasing his Lasix back to 40 mg daily.  I read his PCPs last note and his blood pressure has been slightly uncontrolled so PCP was going to add back his ARB.  At today's visit he still slightly elevated and he is having pretty significant lower extremity edema so I will put him back on his 40 mg a day.  I reviewed his lab work and his creatinine is back to his baseline.  After discharge from the hospital he will need follow-up with PCP to further follow-up his blood pressure and diuretic status.  NO CHARGE as admitting patient to our inpatient service No problem-specific Assessment & Plan notes found for this encounter.   Dorcas Mcmurray MD

## 2020-08-01 NOTE — Progress Notes (Signed)
ANTICOAGULATION CONSULT NOTE - Initial Consult  Pharmacy Consult for warfarin Indication: atrial fibrillation  No Known Allergies  Patient Measurements:  Vital Signs: Temp: 97.9 F (36.6 C) (04/07 1546) Temp Source: Oral (04/07 1546) BP: 153/78 (04/07 1546) Pulse Rate: 78 (04/07 1546)  Labs: Recent Labs    07/30/20 1357 08/01/20 1633  HGB  --  10.5*  HCT  --  31.9*  PLT  --  255  LABPROT  --  18.8*  INR  --  1.6*  CREATININE 1.49* 1.32*    Estimated Creatinine Clearance: 68.4 mL/min (A) (by C-G formula based on SCr of 1.32 mg/dL (H)).   Medical History: Past Medical History:  Diagnosis Date  . CHF (congestive heart failure) ([place])   . Coronary artery disease   . Diabetes mellitus without complication ([place])   . HLD (hyperlipidemia)   . Hypertension     Assessment: 70 yo male admitted for cellulitis, has persistent Afib, on warfarin at home. Patient took warfarin 5mg  dose already this morning, but INR is currently subtherapeutic at 1.6. CBC normal, AST/ALT normal. Also on amiodarone 200mg  daily at home. Home warfarin regimen - 5mg  every day except 7.5mg  on MWF  Goal of Therapy:  INR 2-3 Monitor platelets by anticoagulation protocol: Yes   Plan:  Give warfarin 2.5mg  now (too boost today's dose) Monitor INR daily  Dolores Frame [facility] PharmD Candidate 08/01/2020,6:00 PM

## 2020-08-01 NOTE — H&P (Addendum)
[facility] Admission History and Physical Service Pager: [phone]  Patient name: Rodney [name] Medical record number: [id number] Date of birth: 05-26-1950 Age: 70 y.o. Gender: male  Primary Care Provider: [name], MD Consultants: None  Code Status: Full  Preferred Emergency Contact: Noble Nation, wife, [phone]  Chief Complaint: Cellulitis  Assessment and Plan: [name] is a 70 y.o. male presenting with worsening erythema and swelling of right foot, concerning for cellulitis. 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  Right foot cellulitis: Acute, worsening. Several week history of worsening RLE swelling with subsequent progressive erythema with ruptured great toe blister.  Fortunately afebrile and hemodynamically stable without concern for concurrent sepsis.  No evidence of significant ulceration to suggest underlying osteomyelitis at this time, suspect his small abrasions and ruptured blister have likely been the regions of introducing infection.  Given his history of a similar presentation with subsequent necrotizing fasciitis s/p left BKA in 2021 with known diabetic neuropathy and probable right PAD (history of left sided), would be best treated aggressively inpatient initially to ensure appropriate recovery. -Admit to medical telemetry, attending Dr. Wendy Poet -Start IV vancomycin and cefepime  -Blood culture prior to antibiotic start -CBC, BMP -Wound care consult -Elevate foot -Vascular U/S with ABIs (may need to wait until swelling down a little more) -Could consider MRI in the future pending progression, will hold for now  R Lower extremity swelling: Acute on chronic. 3+ pitting edema to right lower extremity mid shin, likely multifactorial in the setting of  current cellulitis as above and diastolic dysfunction off Lasix therapy for the past few months (due to renal dysfunction).  Will restart Lasix 40 mg daily.  Additionally could consider DVT given significant swelling and sedentary lifestyle, however less likely while on chronic anticoagulation for A. Fib (but has been subtherapeutic on recent INR checks around 1.5). -Restart Lasix 40 mg daily -Elevate foot when possible -DVT U/S -Cellulitis treatment as above  Previous L foot necrotizing fasciitis/cellulitis now s/p left BKA: Chronic, stable.  Completed in 09/2019 by Dr. Sharol Given.  Does not follow with orthopedics outpatient any further.  Has prosthesis in place.  Would like to aggressively treat cellulitis/swelling as above as to avoid recurrent situation on the right side.  Hypertension: Chronic, uncontrolled. Elevated systolic 962I during evaluation today.  Recently restarted losartan 25 mg a few weeks ago with improvement.  Additionally, incidentally has been taking his Coreg once daily rather than twice daily. -Continue losartan 25 mg -Continue home Coreg, however correctly dose at 12.5 mg BID  -Lasix as above and amiodarone for A. fib as well on board -Monitor BP -BMP  T2DM with neuropathy: Chronic, improved control. A1c 6.5% on 07/03/2020.  Takes NovoLog 70/30 with 30U in am and 15U in pm.  Does not check CBGs at home. -Continue NovoLog 70/30, start with 20U am and 10U pm and titrate up as needed -CBG checks with meals and bedtime -Hold home metformin 1000 mg daily  HFpEF, previously HFrEF  Moderate aortic stenosis: Chronic, stable. EF 50-55% with grade 2 DD on echo 07/30/2020.  Follows with heart care, most recently seen in 06/2020.  Hypervolemic with lower extremity swelling, however reassuringly breathing comfortably. -Restart Lasix 40 mg daily -Monitor fluid status -Strict I&O's, daily weights  Persistent atrial fibrillation: Chronic, stable.  Currently in NSR.  Previous TEE/DCCV in  09/2019 however  had recurrence in 10/2019.  Follows with cardiology at heart care.  On warfarin due to cost concerns with Eliquis/Xarelto, INR has been subtherapeutic for the last 2 months. -Check INR (had appt tomorrow but will miss this)  -Pharmacy consult for warfarin -Current schedule: Warfarin 7.5 mg MWF, 71m remaining days -Amiodarone 200 mg daily  CKD stage IIIA: Stable. Cr 1.49 on 4/5, will recheck on admission.  Creatinine slowly downtrending after admission for acute renal failure up to Cr 10 requiring brief dialysis in 03/2020. -Check BMP closely, restarting Lasix -Monitor fluid status as above  Hyperlipidemia  CAD: Stable.  No current concerns.  Secondary prevention. -Continue home atorvastatin 80 mg daily -Aspirin 81 mg daily  FEN/GI: Heart healthy/carb modified  Prophylaxis: Lovenox  Disposition: Admit to medical telemetry, attending Dr. MWendy Poet History of Present Illness:  Rodney WAHLENis a 70y.o. male presenting as a direct admission from the family medicine clinic with worsening cellulitis of his right foot.  **Evaluated patient over at the family medicine clinic as direct admission will likely be later this afternoon.**  He reports an intermittent history of right leg swelling for the past several months while he has been off his Lasix due to renal dysfunction, however became more persistent over the past month.  In the past 2 or so weeks, he now has noticed redness of his right foot.  Initially started just at his toes and now has progressed up to the majority of the foot in the past week.  He has a large fluid-filled blister underneath his right great toe that just recently ruptured within the past 2 days.  He denies any associated fever, fatigue/malaise, rash elsewhere, shortness of breath/chest pain, or purulent drainage.  Otherwise he is felt in his normal state of health.  He has a known history of diabetic neuropathy and has had chronic numbness/tingling to  this foot.  Denies any significant pain in his foot due to having poor sensation.  He has not seen podiatry due to misplacing his driver's license.  He has not had any antibiotics yet.  He has been wrapping his foot and elevating it when he can.  Recently started back on his losartan for his blood pressure, however has not been taking Lasix quite yet.  Of note, he has a history of a left BKA in 09/2019 due to necrotizing fasciitis/cellulitis of this foot in the setting of peripheral vascular disease and diabetic neuropathy.  He had a preceding ulceration with more significant maceration of his foot at that time, however had a similar initial presentation before and is concerned.  Prior to leaving the office while awaiting direct admission, his foot was wrapped with Kerlix and Coban.  Review Of Systems: Per HPI with the following additions:   Review of Systems  Constitutional: Negative for activity change, appetite change, chills, fatigue and fever.  Respiratory: Negative for cough, chest tightness, shortness of breath and wheezing.   Cardiovascular: Positive for leg swelling. Negative for chest pain and palpitations.  Gastrointestinal: Negative for abdominal pain.  Skin: Positive for color change and wound.  Neurological: Negative for syncope and weakness.     Patient Active Problem List   Diagnosis Date Noted  . 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 CAD (coronary artery disease) 02/15/2020  . CRI (chronic renal insufficiency), stage 3 (moderate) ([facility] 02/15/2020  . Long term (current) use of anticoagulants 12/29/2019  . S/P BKA (below knee amputation) unilateral, left ([facility] 11/14/2019  . High risk social situation 11/14/2019  .  Atrial fibrillation ([place]) 10/03/2019  . HFrEF (heart failure with reduced ejection fraction) ([place]) 09/28/2019  . Moderate aortic stenosis 09/28/2019  . Anemia in chronic illness 09/27/2019  . PVD (peripheral vascular disease) ([place])   . SOB (shortness of breath) 07/26/2018   . Peripheral edema 02/24/2018  . Umbilical hernia 45/99/7741  . Obesity 10/09/2015  . Tobacco abuse 09/07/2013  . Uncontrolled type 2 diabetes mellitus with diabetic neuropathy, with long-term current use of insulin ([place]) 09/07/2013  . Colon cancer screening 09/07/2013  . Hypercholesteremia 10/28/2010  . ERECTILE DYSFUNCTION 05/22/2009  . Essential hypertension 01/23/2009    Past Medical History: Past Medical History:  Diagnosis Date  . CHF (congestive heart failure) ([place])   . Coronary artery disease   . Diabetes mellitus without complication ([place])   . HLD (hyperlipidemia)   . Hypertension     Past Surgical History: Past Surgical History:  Procedure Laterality Date  . AMPUTATION Left 09/28/2019   Procedure: AMPUTATION BELOW KNEE;  Surgeon: Newt Minion, MD;  Location: [facility];  Service: Orthopedics;  Laterality: Left;  . CARDIAC CATHETERIZATION    . CARDIOVERSION N/A 10/05/2019   Procedure: CARDIOVERSION;  Surgeon: Sanda Klein, MD;  Location: [facility] ENDOSCOPY;  Service: Cardiovascular;  Laterality: N/A;  . LEFT HEART CATH AND CORONARY ANGIOGRAPHY N/A 10/03/2019   Procedure: LEFT HEART CATH AND CORONARY ANGIOGRAPHY;  Surgeon: Lorretta Harp, MD;  Location: [facility] LAB;  Service: Cardiovascular;  Laterality: N/A;  . TEE WITHOUT CARDIOVERSION N/A 10/05/2019   Procedure: TRANSESOPHAGEAL ECHOCARDIOGRAM (TEE);  Surgeon: Sanda Klein, MD;  Location[facility] ENDOSCOPY;  Service: Cardiovascular;  Laterality: N/A;    Social History: Social History   Tobacco Use  . Smoking status: Current Every Day Smoker    Packs/day: 0.50    Years: 50.00    Pack years: 25.00    Types: Cigarettes  . Smokeless tobacco: Never Used  Substance Use Topics  . Alcohol use: Yes    Alcohol/week: 6.0 standard drinks    Types: 6 Standard drinks or equivalent per week  . Drug use: No   Additional social history: Lives with his wife.  Please also refer to relevant sections of EMR.  Family  History: Family History  Problem Relation Age of Onset  . Alcoholism Mother   . Alcoholism Father     Allergies and Medications: No Known Allergies No current facility-administered medications on file prior to encounter.   Current Outpatient Medications on File Prior to Encounter  Medication Sig Dispense Refill  . amiodarone (PACERONE) 200 MG tablet Take 1 tablet (200 mg total) by mouth daily. 90 tablet 3  . aspirin EC 81 MG tablet Take 1 tablet (81 mg total) by mouth daily. Swallow whole. 90 tablet 3  . atorvastatin (LIPITOR) 80 MG tablet TAKE 1 TABLET EVERY DAY (Patient taking differently: Take 80 mg by mouth daily.) 90 tablet 3  . Blood Glucose Monitoring Suppl (TRUE METRIX METER) DEVI Use to test blood sugar three times daily. 1 each 0  . Blood Glucose Monitoring Suppl (TRUE METRIX METER) w/Device KIT USE AS DIRECTED 1 kit 0  . carvedilol (COREG) 12.5 MG tablet Take 1 tablet (12.5 mg total) by mouth 2 (two) times daily with a meal. 180 tablet 3  . furosemide (LASIX) 40 MG tablet Take 1 tablet (40 mg total) by mouth daily. 90 tablet 3  . glucose blood (RELION TRUE METRIX TEST STRIPS) test strip Use to test blood sugar three times per day. 300 each 3  .  insulin aspart protamine- aspart (NOVOLOG MIX 70/30) (70-30) 100 UNIT/ML injection Inject 15-30 Units into the skin See admin instructions. Inject 30 units into the skin with breakfast and 15 units with supper- "until eventually replaced by Lantus"    . losartan (COZAAR) 25 MG tablet Take 1 tablet (25 mg total) by mouth at bedtime. 90 tablet 3  . metFORMIN (GLUCOPHAGE) 1000 MG tablet Take 0.5 tablets (500 mg total) by mouth 2 (two) times daily with a meal. 180 tablet 3  . multivitamin (RENA-VIT) TABS tablet Take 1 tablet by mouth at bedtime.  0  . TRUEplus Lancets 33G MISC Use to test blood sugar three times per day. 300 each 3  . warfarin (COUMADIN) 5 MG tablet Take 1 tablet by mouth once daily 60 tablet 0    Objective: Vitals in  office prior to direct admission:  BP 155/65, pulse 66, temp 98.1, pulse ox 93%  Exam: General: Alert, no acute distress Eyes: EOMI ENTM: MMM Neck: Supple Cardiovascular: Regular rate and rhythm Respiratory: Clear anteriorly, no increased work of breathing Gastrointestinal: Abdomen soft MSK: Left BKA with prosthesis.  Limited ROM of right ankle due to swelling, otherwise able to move his right extremity spontaneously.  3+ pitting edema to approximately mid shin, 1+ pitting to level of knee on the right. Derm: Poorly circumscribed erythema present from distal right toes to approximately mid/upper foot, area marked/dated with skin marker.  Thickened and hyperpigmented nails present.  Open blister present on entire dorsum of great toe.  Sensation to light touch intact throughout foot, however decreased comparatively to other extremities.  Foot is warm to touch with ~3 sec cap refill, but difficult to find dorsalis pedis pulse due to swelling.  See pictures below. Neuro: Alert and oriented.  Able to move all extremities spontaneously.  Speech easily understandable, smile symmetrical. Psych: Calm mood and affect.  Good eye contact.  Answers questions appropriately.          Labs and Imaging: CBC BMET  No results for input(s): WBC, HGB, HCT, PLT in the last 168 hours. Recent Labs  Lab 07/30/20 1357  NA 136  K 5.2  CL 100  CO2 21  BUN 40*  CREATININE 1.49*  GLUCOSE 141*  CALCIUM 8.7      Patriciaann Clan, DO 08/01/2020, 12:11 PM PGY-3, [facility] Intern pager: [phone], text pages welcome

## 2020-08-01 NOTE — Progress Notes (Signed)
Pharmacy Antibiotic Note  Rodney [name] is a 70 y.o. male admitted on 08/01/2020 with cellulitis.  Pharmacy has been consulted for vancomycin and cefepime dosing. Creatinine at baseline.  Plan: Vancomycin 1500 mg IV Q 24 H Cefepime 2 g IV Q 8 H Follow up cultures    Temp (24hrs), Avg:98 F (36.7 C), Min:97.9 F (36.6 C), Max:98.1 F (36.7 C)  Recent Labs  Lab 07/30/20 1357 08/01/20 1633  WBC  --  8.4  CREATININE 1.49* 1.32*    Estimated Creatinine Clearance: 68.4 mL/min (A) (by C-G formula based on SCr of 1.32 mg/dL (H)).    No Known Allergies  Antimicrobials this admission: Vancomycin (08/01/20) >> Cefepime (08/01/20) >>  Microbiology results: Sent  Thank you for allowing pharmacy to be a part of this patient's care.  Dolores Frame [facility] PharmD Candidate 08/01/2020 6:11 PM

## 2020-08-01 NOTE — Plan of Care (Signed)
  Problem: Education: Goal: Knowledge of General Education information will improve Description Including pain rating scale, medication(s)/side effects and non-pharmacologic comfort measures Outcome: Progressing   

## 2020-08-02 ENCOUNTER — Observation Stay (HOSPITAL_COMMUNITY): Payer: Medicare PPO

## 2020-08-02 ENCOUNTER — Inpatient Hospital Stay (HOSPITAL_COMMUNITY): Payer: Medicare PPO

## 2020-08-02 DIAGNOSIS — E1149 Type 2 diabetes mellitus with other diabetic neurological complication: Secondary | ICD-10-CM | POA: Diagnosis not present

## 2020-08-02 DIAGNOSIS — D62 Acute posthemorrhagic anemia: Secondary | ICD-10-CM | POA: Diagnosis not present

## 2020-08-02 DIAGNOSIS — L03115 Cellulitis of right lower limb: Secondary | ICD-10-CM | POA: Diagnosis not present

## 2020-08-02 DIAGNOSIS — E1140 Type 2 diabetes mellitus with diabetic neuropathy, unspecified: Secondary | ICD-10-CM | POA: Diagnosis not present

## 2020-08-02 DIAGNOSIS — I2510 Atherosclerotic heart disease of native coronary artery without angina pectoris: Secondary | ICD-10-CM | POA: Diagnosis present

## 2020-08-02 DIAGNOSIS — I998 Other disorder of circulatory system: Secondary | ICD-10-CM | POA: Diagnosis not present

## 2020-08-02 DIAGNOSIS — I700 Atherosclerosis of aorta: Secondary | ICD-10-CM | POA: Diagnosis not present

## 2020-08-02 DIAGNOSIS — I4819 Other persistent atrial fibrillation: Secondary | ICD-10-CM

## 2020-08-02 DIAGNOSIS — E1122 Type 2 diabetes mellitus with diabetic chronic kidney disease: Secondary | ICD-10-CM | POA: Diagnosis not present

## 2020-08-02 DIAGNOSIS — M6089 Other myositis, multiple sites: Secondary | ICD-10-CM | POA: Diagnosis not present

## 2020-08-02 DIAGNOSIS — R600 Localized edema: Secondary | ICD-10-CM | POA: Diagnosis not present

## 2020-08-02 DIAGNOSIS — N179 Acute kidney failure, unspecified: Secondary | ICD-10-CM | POA: Diagnosis not present

## 2020-08-02 DIAGNOSIS — E11621 Type 2 diabetes mellitus with foot ulcer: Secondary | ICD-10-CM | POA: Diagnosis present

## 2020-08-02 DIAGNOSIS — I350 Nonrheumatic aortic (valve) stenosis: Secondary | ICD-10-CM | POA: Diagnosis present

## 2020-08-02 DIAGNOSIS — E785 Hyperlipidemia, unspecified: Secondary | ICD-10-CM | POA: Diagnosis present

## 2020-08-02 DIAGNOSIS — I739 Peripheral vascular disease, unspecified: Secondary | ICD-10-CM

## 2020-08-02 DIAGNOSIS — I5020 Unspecified systolic (congestive) heart failure: Secondary | ICD-10-CM | POA: Diagnosis not present

## 2020-08-02 DIAGNOSIS — S7011XA Contusion of right thigh, initial encounter: Secondary | ICD-10-CM | POA: Diagnosis not present

## 2020-08-02 DIAGNOSIS — L03119 Cellulitis of unspecified part of limb: Secondary | ICD-10-CM

## 2020-08-02 DIAGNOSIS — S301XXA Contusion of abdominal wall, initial encounter: Secondary | ICD-10-CM | POA: Diagnosis not present

## 2020-08-02 DIAGNOSIS — I10 Essential (primary) hypertension: Secondary | ICD-10-CM | POA: Diagnosis not present

## 2020-08-02 DIAGNOSIS — Z794 Long term (current) use of insulin: Secondary | ICD-10-CM

## 2020-08-02 DIAGNOSIS — E1165 Type 2 diabetes mellitus with hyperglycemia: Secondary | ICD-10-CM

## 2020-08-02 DIAGNOSIS — R0989 Other specified symptoms and signs involving the circulatory and respiratory systems: Secondary | ICD-10-CM | POA: Diagnosis not present

## 2020-08-02 DIAGNOSIS — N183 Chronic kidney disease, stage 3 unspecified: Secondary | ICD-10-CM | POA: Diagnosis not present

## 2020-08-02 DIAGNOSIS — I70235 Atherosclerosis of native arteries of right leg with ulceration of other part of foot: Secondary | ICD-10-CM | POA: Diagnosis not present

## 2020-08-02 DIAGNOSIS — N1831 Chronic kidney disease, stage 3a: Secondary | ICD-10-CM | POA: Diagnosis present

## 2020-08-02 DIAGNOSIS — Z7901 Long term (current) use of anticoagulants: Secondary | ICD-10-CM | POA: Diagnosis not present

## 2020-08-02 DIAGNOSIS — I70261 Atherosclerosis of native arteries of extremities with gangrene, right leg: Secondary | ICD-10-CM | POA: Diagnosis not present

## 2020-08-02 DIAGNOSIS — E1152 Type 2 diabetes mellitus with diabetic peripheral angiopathy with gangrene: Secondary | ICD-10-CM | POA: Diagnosis not present

## 2020-08-02 DIAGNOSIS — M60073 Infective myositis, right foot: Secondary | ICD-10-CM | POA: Diagnosis not present

## 2020-08-02 DIAGNOSIS — E11628 Type 2 diabetes mellitus with other skin complications: Secondary | ICD-10-CM | POA: Diagnosis present

## 2020-08-02 DIAGNOSIS — Z89512 Acquired absence of left leg below knee: Secondary | ICD-10-CM | POA: Diagnosis not present

## 2020-08-02 DIAGNOSIS — Z7984 Long term (current) use of oral hypoglycemic drugs: Secondary | ICD-10-CM | POA: Diagnosis not present

## 2020-08-02 DIAGNOSIS — I77810 Thoracic aortic ectasia: Secondary | ICD-10-CM | POA: Diagnosis not present

## 2020-08-02 DIAGNOSIS — I130 Hypertensive heart and chronic kidney disease with heart failure and stage 1 through stage 4 chronic kidney disease, or unspecified chronic kidney disease: Secondary | ICD-10-CM | POA: Diagnosis not present

## 2020-08-02 DIAGNOSIS — Z01810 Encounter for preprocedural cardiovascular examination: Secondary | ICD-10-CM | POA: Diagnosis not present

## 2020-08-02 DIAGNOSIS — I5032 Chronic diastolic (congestive) heart failure: Secondary | ICD-10-CM | POA: Diagnosis not present

## 2020-08-02 DIAGNOSIS — E871 Hypo-osmolality and hyponatremia: Secondary | ICD-10-CM | POA: Diagnosis not present

## 2020-08-02 DIAGNOSIS — I96 Gangrene, not elsewhere classified: Secondary | ICD-10-CM | POA: Diagnosis not present

## 2020-08-02 DIAGNOSIS — S90822D Blister (nonthermal), left foot, subsequent encounter: Secondary | ICD-10-CM

## 2020-08-02 DIAGNOSIS — D649 Anemia, unspecified: Secondary | ICD-10-CM | POA: Diagnosis not present

## 2020-08-02 DIAGNOSIS — Z720 Tobacco use: Secondary | ICD-10-CM | POA: Diagnosis not present

## 2020-08-02 DIAGNOSIS — M7989 Other specified soft tissue disorders: Secondary | ICD-10-CM | POA: Diagnosis not present

## 2020-08-02 DIAGNOSIS — Z20822 Contact with and (suspected) exposure to covid-19: Secondary | ICD-10-CM | POA: Diagnosis not present

## 2020-08-02 DIAGNOSIS — L97519 Non-pressure chronic ulcer of other part of right foot with unspecified severity: Secondary | ICD-10-CM | POA: Diagnosis present

## 2020-08-02 LAB — COMPREHENSIVE METABOLIC PANEL
ALT: 14 U/L (ref 0–44)
AST: 13 U/L — ABNORMAL LOW (ref 15–41)
Albumin: 2.7 g/dL — ABNORMAL LOW (ref 3.5–5.0)
Alkaline Phosphatase: 64 U/L (ref 38–126)
Anion gap: 6 (ref 5–15)
BUN: 22 mg/dL (ref 8–23)
CO2: 27 mmol/L (ref 22–32)
Calcium: 8.9 mg/dL (ref 8.9–10.3)
Chloride: 104 mmol/L (ref 98–111)
Creatinine, Ser: 1.3 mg/dL — ABNORMAL HIGH (ref 0.61–1.24)
GFR, Estimated: 60 mL/min — ABNORMAL LOW (ref >60)
Glucose, Bld: 121 mg/dL — ABNORMAL HIGH (ref 70–99)
Potassium: 5 mmol/L (ref 3.5–5.1)
Sodium: 137 mmol/L (ref 135–145)
Total Bilirubin: 0.5 mg/dL (ref 0.3–1.2)
Total Protein: 6 g/dL — ABNORMAL LOW (ref 6.5–8.1)

## 2020-08-02 LAB — CBC
HCT: 34.1 % — ABNORMAL LOW (ref 39.0–52.0)
Hemoglobin: 11 g/dL — ABNORMAL LOW (ref 13.0–17.0)
MCH: 29.4 pg (ref 26.0–34.0)
MCHC: 32.3 g/dL (ref 30.0–36.0)
MCV: 91.2 fL (ref 80.0–100.0)
Platelets: 285 10*3/uL (ref 150–400)
RBC: 3.74 MIL/uL — ABNORMAL LOW (ref 4.22–5.81)
RDW: 12.9 % (ref 11.5–15.5)
WBC: 8.9 10*3/uL (ref 4.0–10.5)
nRBC: 0 % (ref 0.0–0.2)

## 2020-08-02 LAB — GLUCOSE, CAPILLARY
Glucose-Capillary: 108 mg/dL — ABNORMAL HIGH (ref 70–99)
Glucose-Capillary: 125 mg/dL — ABNORMAL HIGH (ref 70–99)
Glucose-Capillary: 141 mg/dL — ABNORMAL HIGH (ref 70–99)
Glucose-Capillary: 151 mg/dL — ABNORMAL HIGH (ref 70–99)

## 2020-08-02 LAB — PROTIME-INR
INR: 2 — ABNORMAL HIGH (ref 0.8–1.2)
Prothrombin Time: 21.7 seconds — ABNORMAL HIGH (ref 11.4–15.2)

## 2020-08-02 IMAGING — MR MR FOOT*R* WO/W CM
5 of 10 series · 19 of 40 positions shown · IV contrast (gadavist)
Comparison: Radiographs [DATE]

CLINICAL DATA: Pain and swelling and large dorsal foot blister.

EXAM:
MRI OF THE RIGHT FOREFOOT WITHOUT AND WITH CONTRAST
TECHNIQUE: Multiplanar, multisequence MR imaging of the right foot was
performed before and after the administration of intravenous
contrast.
CONTRAST:  10mL GADAVIST GADOBUTROL 1 MMOL/ML IV SOLN

[Series 3: T1 · coronal · 3.0mm · 0.27mm/px · 4 of 43 slices shown (1 of 2)]
[im 1/43]
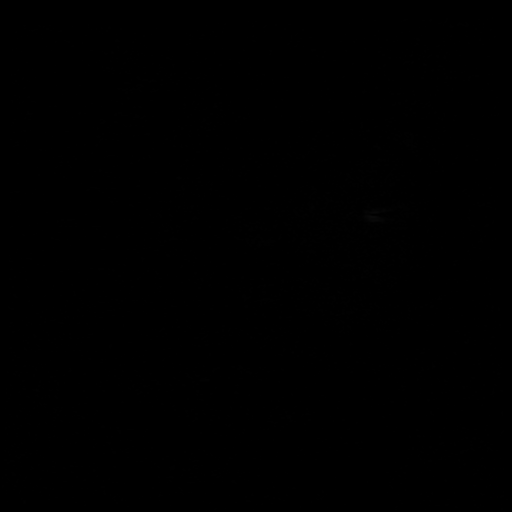
[im 15/43]
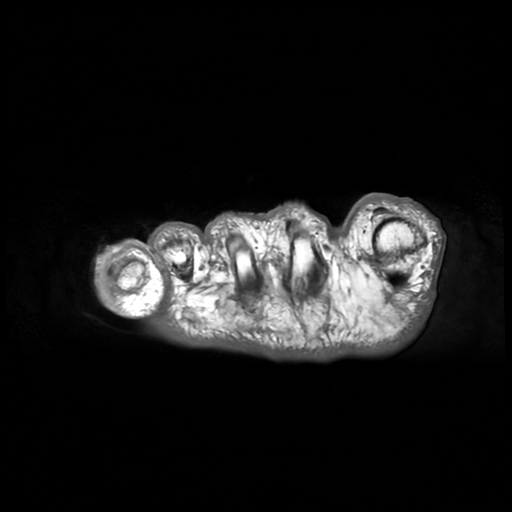
[im 29/43]
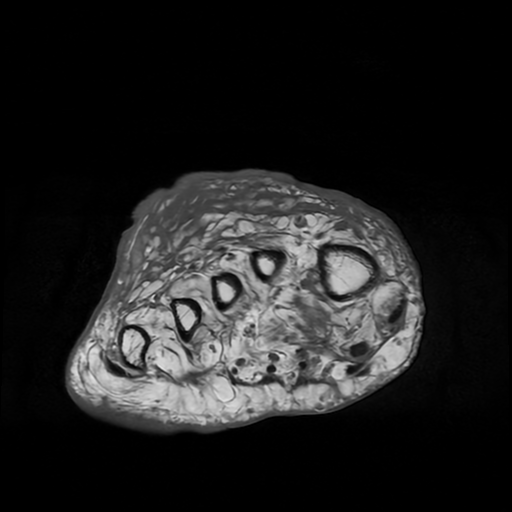
[im 43/43]
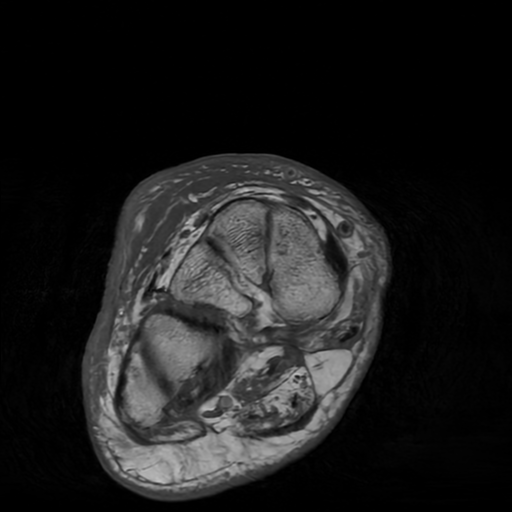

[Series 5: T1 fat-sat · coronal · non-contrast · 3.0mm · 0.27mm/px · 5 of 42 slices shown (1 of 2)]
[im 1/42]
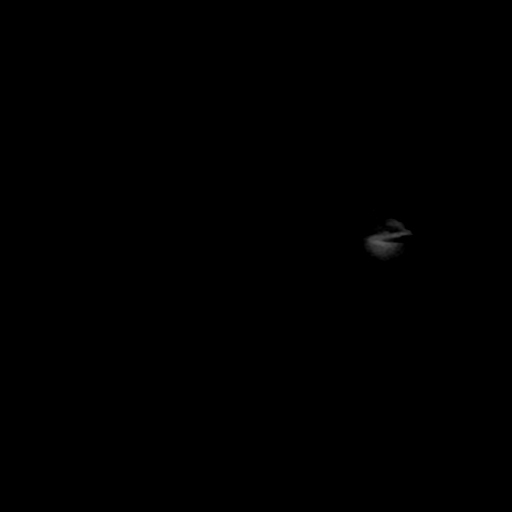
[im 11/42]
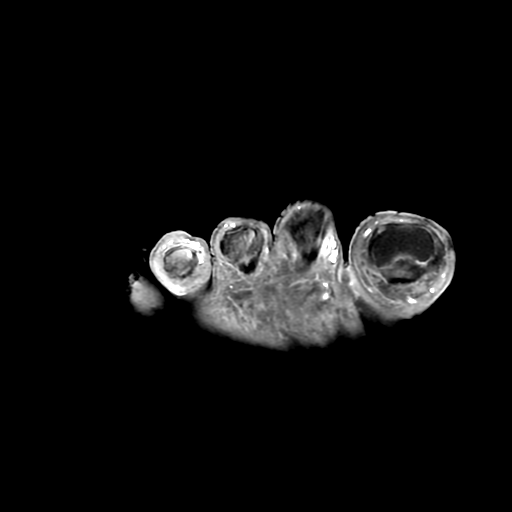
[im 21/42]
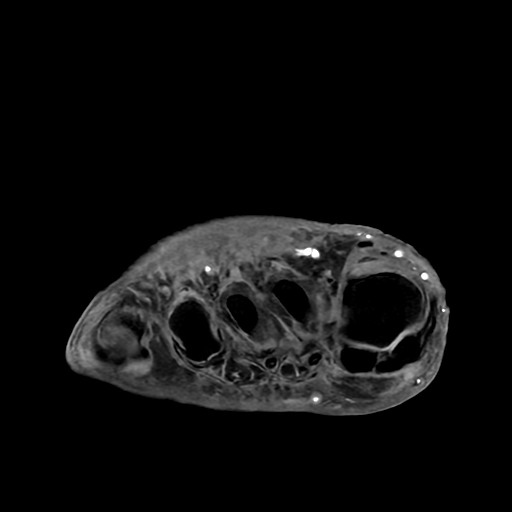
[im 31/42]
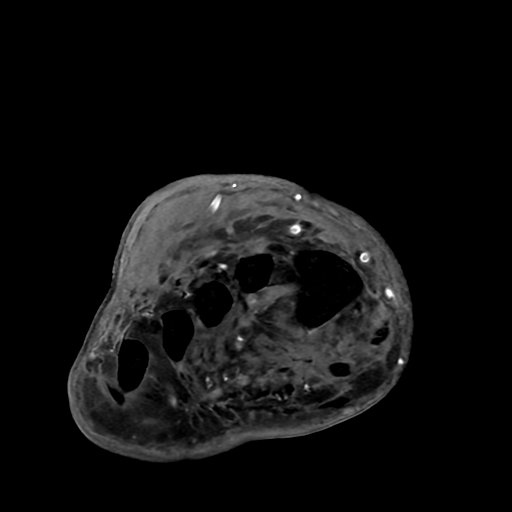
[im 42/42]
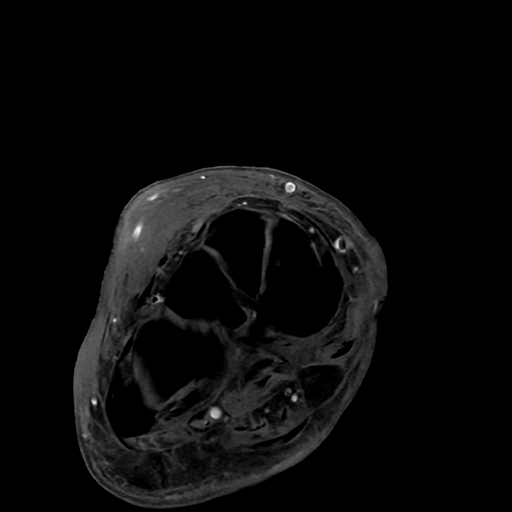

[Series 6: T2 fat-sat · coronal · 3.0mm · 0.27mm/px · 5 of 42 slices shown]
[im 1/42]
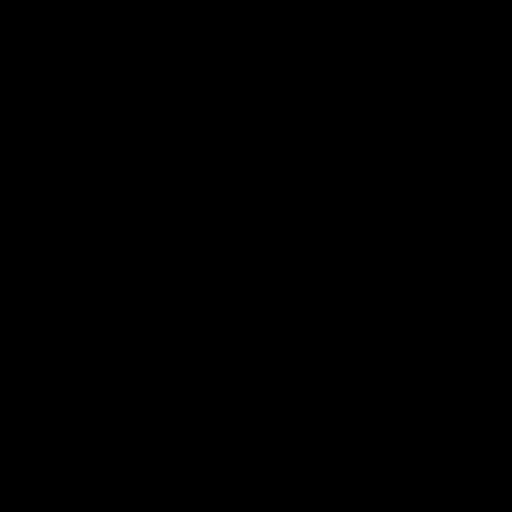
[im 11/42]
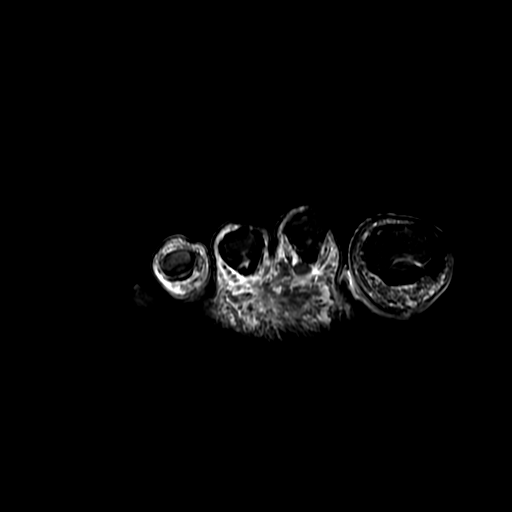
[im 21/42]
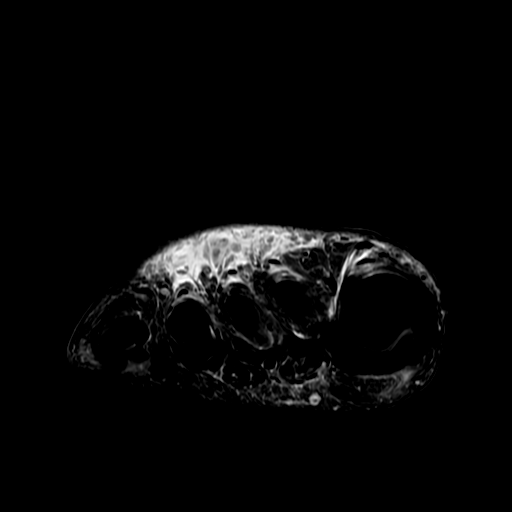
[im 31/42]
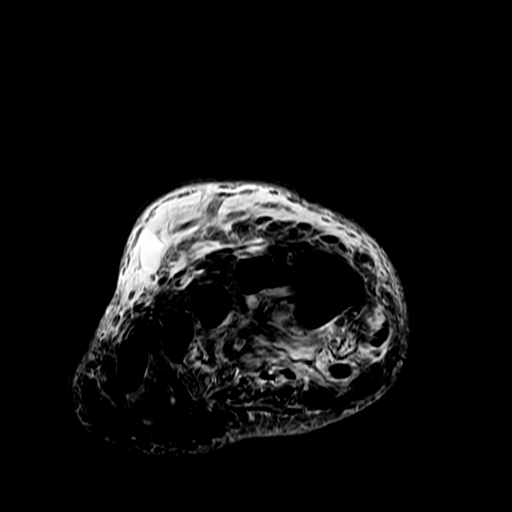
[im 42/42]
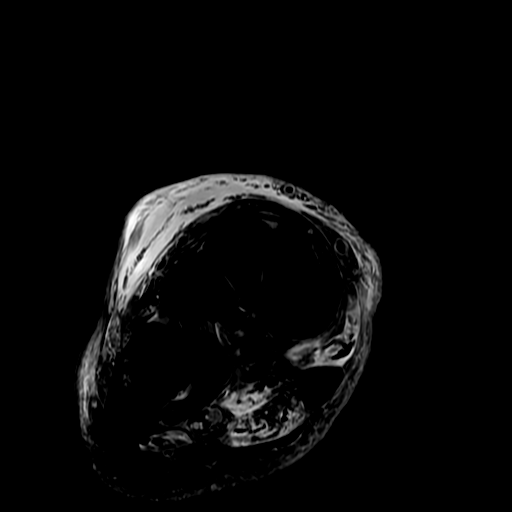

[Series 8: T1 · oblique · 3.0mm · 0.31mm/px · 3 of 25 slices shown (2 of 2)]
[im 1/25]
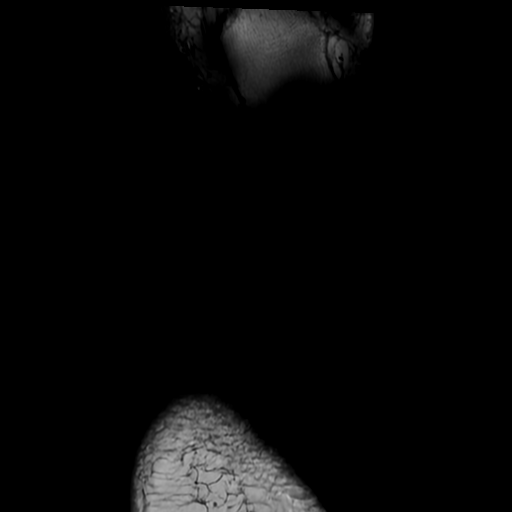
[im 13/25]
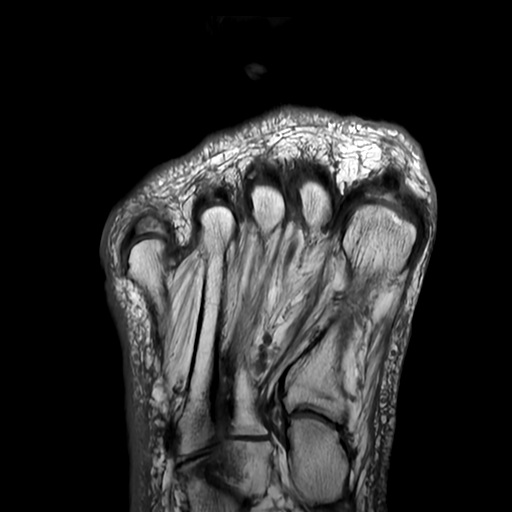
[im 25/25]
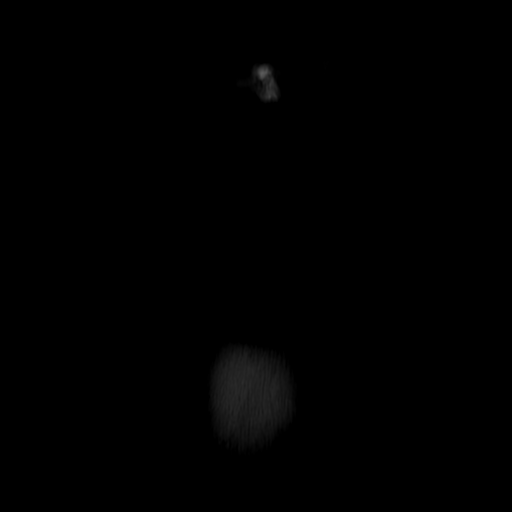

[Series 11: T1 fat-sat · coronal · 3.0mm · 0.27mm/px · 2 of 42 slices shown (2 of 2)]
[im 1/42]
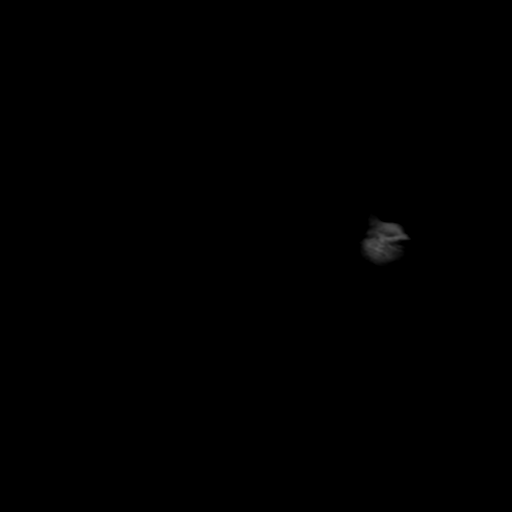
[im 11/42]
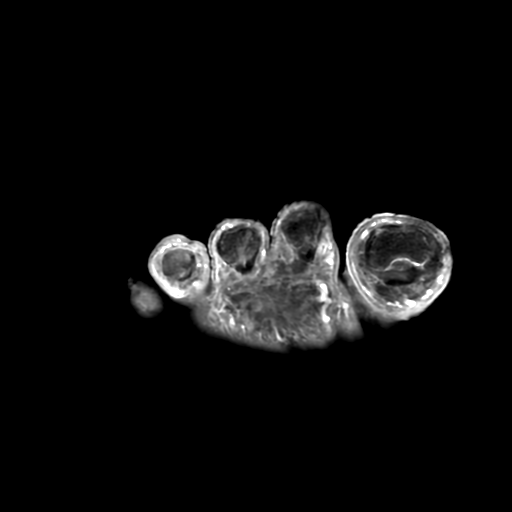

[19 of 40 positions shown; findings below may reference images not displayed]

FINDINGS: Marked dorsal subcutaneous soft tissue swelling/edema/fluid and
associated skin blistering. I do not however see any discrete rim
enhancing fluid collection to suggest a drainable abscess. No
obvious gas is seen in the soft tissues.

Diffuse myositis involving the foot musculature but no evidence of
pyomyositis.

The bony structures are intact. No findings suspicious for septic
arthritis or osteomyelitis. Suspect remote fracture involving the
proximal phalanx of the fifth toe.

The major tendons and ligaments appear intact.
IMPRESSION: 1. Marked dorsal subcutaneous soft tissue swelling/edema/fluid and
associated skin blistering. No discrete rim enhancing fluid
collection to suggest a drainable abscess.
2. Diffuse myositis involving the foot musculature but no evidence
of pyomyositis.
3. No findings for septic arthritis or osteomyelitis.

## 2020-08-02 MED ORDER — WARFARIN SODIUM 7.5 MG PO TABS
7.5000 mg | ORAL_TABLET | Freq: Once | ORAL | Status: AC
  Administered 2020-08-02: 7.5 mg via ORAL
  Filled 2020-08-02: qty 1

## 2020-08-02 MED ORDER — ALBUTEROL SULFATE (2.5 MG/3ML) 0.083% IN NEBU
2.5000 mg | INHALATION_SOLUTION | Freq: Four times a day (QID) | RESPIRATORY_TRACT | Status: DC | PRN
  Administered 2020-08-02 – 2020-08-12 (×14): 2.5 mg via RESPIRATORY_TRACT
  Filled 2020-08-02 (×14): qty 3

## 2020-08-02 MED ORDER — GADOBUTROL 1 MMOL/ML IV SOLN
10.0000 mL | Freq: Once | INTRAVENOUS | Status: AC | PRN
  Administered 2020-08-02: 10 mL via INTRAVENOUS

## 2020-08-02 NOTE — Consult Note (Signed)
[facility] Nurse Consult Note: Patient receiving care in [facility]. Reason for Consult: ruptured right toe blister Wound type: Patient with a history of DM type 2 Pressure Injury POA: Yes/No/NA Measurement: Wound bed: see photos Drainage (amount, consistency, odor)  Periwound: Dressing procedure/placement/frequency:  Cut an Aquacel dressing Kellie Simmering [phone]) into a long strip. Weave between toes on right foot. Secure with a few turns of kerlex. Change daily.  Considering the patient's underlying medical conditions, you may want to consult orthopedic services as well.  Thank you for the consult. [facility] nurse will not follow at this time.  Please re-consult the [facility] team if needed.  Val Riles, RN, MSN, CWOCN, CNS-BC, pager [phone]

## 2020-08-02 NOTE — Progress Notes (Signed)
1620 Pt is c/o shortness of breath. Wheezing noted. O2 sats 95% at room air. Notified attending. Breathing treatment ordered and given with relief.

## 2020-08-02 NOTE — Progress Notes (Signed)
Family Medicine Teaching Service Daily Progress Note Intern Pager: [phone]  Patient name: Rodney [name] Medical record number: [id number] Date of birth: 1951-03-21 Age: 70 y.o. Gender: male  Primary Care Provider: [name], MD Consultants: none   Code Status: Full Code  Pt Overview and Major Events to Date:  4/7 admitted  Assessment and Plan: Rodney [name] is a 70 y.o. male who presents with right foot swelling and erythema concerning for cellulitis. 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  Right foot cellulitis Remains afebrile without leukocytosis.  Continues to have right lower extremity swelling in the setting of cellulitis and heart failure.  Continue broad-spectrum IV antibiotics given history of necrotizing fasciitis.  Unfortunately, blood cultures were not obtained prior to antibiotic initiation.  Evaluated by wound care, recommendation to consult orthopedics given underlying medical conditions. - IV vancomycin (4/7-) - IV cefepime (4/7-) - ABI - MRI to assess for osteomyelitis and Charcot foot - elevate foot - wound care recommendations: Cut an Aquacel dressing Kellie Simmering [phone]) into a long strip. Weave between toes on right foot. Secure with a few turns of kerlex. Change daily. - consider ortho c/s pending MRI  HTN Adequately controlled, SBP 130s-140s. - home losartan 25 mg - home carvedilol 12.5 mg BID (correctly dosed, previously taking it incorrectly once daily)  T2DM Most recent A1c 6.5% in March, 2022.  On Metformin 1000 mg daily and NovoLog 70/30 insulin 30 units in the morning and 15 units in the evening.  Adequately controlled on reduced 70/30 insulin regimen so far. - 70/30 insulin 20 units AM, 10 units PM - hold metformin - monitor CBG  HFpEF Most recent TTE 07/30/2020 with EF 50 to 55% with G2 DD.  Lower extremity  edema but breathing comfortably.  Furosemide 40 mg was recently restarted. - furosemide 40 mg daily - strict I/O, daily weights  Moderate AS Noted on most recent TTE.  Systolic murmur on exam consistent with moderate AS.  Persistent A Fib History of cardioversion and 09/2019 but has since recurred.  On warfarin due to cost concerns.  INR has been subtherapeutic for the last 2 months.  On amiodarone for rhythm control.  NSR currently, INR therapeutic today. - warfarin per pharmacy - monitor INR - amiodarone 200 mg daily  CKD IIIa Stable, Cr 1.30 this morning. - avoid nephrotoxic medications - monitor BMP  CAD On secondary prevention. - continue atorvastatin 80 mg daily - continue ASA 81 mg daily  FEN/GI: heart-healthy/carb-modified PPx: LMWH  Disposition: med-tele  Subjective:  NAD.  No concerns at this time, feels well overall.  Objective: Temp:  [97.9 F (36.6 C)-98.5 F (36.9 C)] 98.5 F (36.9 C) (04/08 0322) Pulse Rate:  [65-78] 65 (04/08 0322) Resp:  [17] 17 (04/08 0322) BP: (137-155)/(65-78) 137/71 (04/08 0322) SpO2:  [93 %-94 %] 93 % (04/08 0322) Physical Exam: General: Obese male laying in bed comfortably, NAD Cardiovascular: RRR, 3/6 systolic murmur best heard at the LUSB Respiratory: CTAB, breathing comfortably on room air Abdomen: Soft, nontender, +BS Extremities: 2+ pitting edema to right lower extremity, right lower extremity currently wrapped in dressing, s/p left BKA  Laboratory: Recent Labs  Lab 08/01/20 1633 08/02/20 0259  WBC 8.4 8.9  HGB 10.5* 11.0*  HCT 31.9* 34.1*  PLT 255 285   Recent Labs  Lab 07/30/20 1357 08/01/20 1633 08/02/20 0259  NA 136 134* 137  K 5.2 4.2  5.0  CL 100 102 104  CO2 21 27 27   BUN 40* 23 22  CREATININE 1.49* 1.32* 1.30*  CALCIUM 8.7 8.6* 8.9  PROT  --  5.7* 6.0*  BILITOT  --  0.3 0.5  ALKPHOS  --  56 64  ALT  --  14 14  AST  --  15 13*  GLUCOSE 141* 249* 121*     Imaging/Diagnostic Tests: No new  imaging.  Zola Button, MD 08/02/2020, 7:30 AM PGY-1, [place] Intern pager: [phone], text pages welcome

## 2020-08-02 NOTE — Plan of Care (Addendum)
Dressing change, temperature good, decreased redness, no pain, minimum drainage.  Problem: Education: Goal: Knowledge of General Education information will improve Description: Including pain rating scale, medication(s)/side effects and non-pharmacologic comfort measures Outcome: Progressing   Problem: Activity: Goal: Risk for activity intolerance will decrease Outcome: Progressing   Problem: Pain Managment: Goal: General experience of comfort will improve Outcome: Progressing   Problem: Safety: Goal: Ability to remain free from injury will improve Outcome: Progressing   Problem: Skin Integrity: Goal: Risk for impaired skin integrity will decrease Outcome: Progressing

## 2020-08-02 NOTE — Progress Notes (Signed)
ANTICOAGULATION CONSULT NOTE - Follow Up Consult  Pharmacy Consult for Warfarin Indication: atrial fibrillation  No Known Allergies  Patient Measurements:     Vital Signs: Temp: 98.7 F (37.1 C) (04/08 0752) Temp Source: Oral (04/08 0752) BP: 143/72 (04/08 0752) Pulse Rate: 66 (04/08 0752)  Labs: Recent Labs    07/30/20 1357 08/01/20 1633 08/02/20 0259  HGB  --  10.5* 11.0*  HCT  --  31.9* 34.1*  PLT  --  255 285  LABPROT  --  18.8* 21.7*  INR  --  1.6* 2.0*  CREATININE 1.49* 1.32* 1.30*    Estimated Creatinine Clearance: 69.5 mL/min (A) (by C-G formula based on SCr of 1.3 mg/dL (H)).   Medications:  Medications Prior to Admission  Medication Sig Dispense Refill Last Dose  . amiodarone (PACERONE) 200 MG tablet Take 1 tablet (200 mg total) by mouth daily. 90 tablet 3 08/01/2020 at 1400  . aspirin EC 81 MG tablet Take 1 tablet (81 mg total) by mouth daily. Swallow whole. 90 tablet 3 08/01/2020 at 1300  . atorvastatin (LIPITOR) 80 MG tablet TAKE 1 TABLET EVERY DAY (Patient taking differently: Take 80 mg by mouth daily.) 90 tablet 3 08/01/2020 at am  . carvedilol (COREG) 12.5 MG tablet Take 1 tablet (12.5 mg total) by mouth 2 (two) times daily with a meal. (Patient taking differently: Take 12.5 mg by mouth daily.) 180 tablet 3 08/01/2020 at 1300  . furosemide (LASIX) 20 MG tablet Take 20 mg by mouth daily.   07/18/2020 at Unknown time  . insulin NPH-regular Human (NOVOLIN 70/30 RELION) (70-30) 100 UNIT/ML injection Inject 15-30 Units into the skin See admin instructions. Inject 30 units into the skin with breakfast and 15 units with supper- "until eventually replaced by Lantus"   08/01/2020 at 1400  . losartan (COZAAR) 25 MG tablet Take 1 tablet (25 mg total) by mouth at bedtime. (Patient taking differently: Take 25 mg by mouth daily.) 90 tablet 3 08/01/2020 at Unknown time  . metFORMIN (GLUCOPHAGE) 1000 MG tablet Take 0.5 tablets (500 mg total) by mouth 2 (two) times daily with a meal.  (Patient taking differently: Take 1,000 mg by mouth daily.) 180 tablet 3 08/01/2020 at Unknown time  . nicotine (NICODERM CQ - DOSED IN MG/24 HR) 7 mg/24hr patch Place 7 mg onto the skin daily.   unk  . warfarin (COUMADIN) 5 MG tablet Take 1 tablet by mouth once daily (Patient taking differently: Take 5-7.5 mg by mouth See admin instructions. Take 5 mg by mouth once a day on Sun/Tues/Thurs/Sat and 7.5 mg on Mon/Wed/Fri) 60 tablet 0 08/01/2020 at 1400  . Blood Glucose Monitoring Suppl (TRUE METRIX METER) DEVI Use to test blood sugar three times daily. 1 each 0 N/A  . Blood Glucose Monitoring Suppl (TRUE METRIX METER) w/Device KIT USE AS DIRECTED 1 kit 0 N/A  . furosemide (LASIX) 40 MG tablet Take 1 tablet (40 mg total) by mouth daily. 90 tablet 3 not yet  . glucose blood (RELION TRUE METRIX TEST STRIPS) test strip Use to test blood sugar three times per day. 300 each 3 N/A  . multivitamin (RENA-VIT) TABS tablet Take 1 tablet by mouth at bedtime.  0 unk  . spironolactone (ALDACTONE) 25 MG tablet Take 25 mg by mouth daily.   unk  . TRUEplus Lancets 33G MISC Use to test blood sugar three times per day. 300 each 3 N/A    Assessment: 70 yo M on warfarin PTA for afib.  Pt  was admitted with worsening cellulitis / toe wound and started on empiric antibiotics.  INR is at lower end of goal today.  Pt due for 7.32m dose per home regimen.  Will continue.  PTA dose 559mdaily except 7.33m46mWF  Goal of Therapy:  INR 2-3 Monitor platelets by anticoagulation protocol: Yes   Plan:  Warfarin 7.33mg31m x 1 tonight. Continue daily INR.   [employer] Incarm.D., BCPS Clinical Pharmacist  **Pharmacist phone directory can be found on [email or web address] listed under MC PSabinal/11/2020 9:23 AM

## 2020-08-02 NOTE — Progress Notes (Signed)
ABI study completed.   Please see CV Proc for preliminary results.   Darlin Coco, RDMS, RVT

## 2020-08-02 NOTE — Plan of Care (Signed)

## 2020-08-03 ENCOUNTER — Encounter (HOSPITAL_COMMUNITY): Payer: Self-pay | Admitting: Family Medicine

## 2020-08-03 DIAGNOSIS — L03119 Cellulitis of unspecified part of limb: Secondary | ICD-10-CM | POA: Diagnosis not present

## 2020-08-03 DIAGNOSIS — Z720 Tobacco use: Secondary | ICD-10-CM

## 2020-08-03 DIAGNOSIS — I4819 Other persistent atrial fibrillation: Secondary | ICD-10-CM | POA: Diagnosis not present

## 2020-08-03 DIAGNOSIS — E1149 Type 2 diabetes mellitus with other diabetic neurological complication: Secondary | ICD-10-CM | POA: Diagnosis not present

## 2020-08-03 DIAGNOSIS — M60073 Infective myositis, right foot: Secondary | ICD-10-CM | POA: Diagnosis present

## 2020-08-03 DIAGNOSIS — I10 Essential (primary) hypertension: Secondary | ICD-10-CM

## 2020-08-03 DIAGNOSIS — Z89512 Acquired absence of left leg below knee: Secondary | ICD-10-CM

## 2020-08-03 LAB — GLUCOSE, CAPILLARY
Glucose-Capillary: 124 mg/dL — ABNORMAL HIGH (ref 70–99)
Glucose-Capillary: 142 mg/dL — ABNORMAL HIGH (ref 70–99)
Glucose-Capillary: 129 mg/dL — ABNORMAL HIGH (ref 70–99)
Glucose-Capillary: 102 mg/dL — ABNORMAL HIGH (ref 70–99)
Glucose-Capillary: 103 mg/dL — ABNORMAL HIGH (ref 70–99)
Glucose-Capillary: 92 mg/dL (ref 70–99)

## 2020-08-03 LAB — BASIC METABOLIC PANEL
Anion gap: 6 (ref 5–15)
BUN: 20 mg/dL (ref 8–23)
CO2: 27 mmol/L (ref 22–32)
Calcium: 8.7 mg/dL — ABNORMAL LOW (ref 8.9–10.3)
Chloride: 102 mmol/L (ref 98–111)
Creatinine, Ser: 1.38 mg/dL — ABNORMAL HIGH (ref 0.61–1.24)
GFR, Estimated: 56 mL/min — ABNORMAL LOW (ref >60)
Glucose, Bld: 136 mg/dL — ABNORMAL HIGH (ref 70–99)
Potassium: 4.7 mmol/L (ref 3.5–5.1)
Sodium: 135 mmol/L (ref 135–145)

## 2020-08-03 LAB — CBC
HCT: 33.2 % — ABNORMAL LOW (ref 39.0–52.0)
Hemoglobin: 10.7 g/dL — ABNORMAL LOW (ref 13.0–17.0)
MCH: 29.2 pg (ref 26.0–34.0)
MCHC: 32.2 g/dL (ref 30.0–36.0)
MCV: 90.5 fL (ref 80.0–100.0)
Platelets: 262 10*3/uL (ref 150–400)
RBC: 3.67 MIL/uL — ABNORMAL LOW (ref 4.22–5.81)
RDW: 12.9 % (ref 11.5–15.5)
WBC: 9.9 10*3/uL (ref 4.0–10.5)
nRBC: 0 % (ref 0.0–0.2)

## 2020-08-03 LAB — PROTIME-INR
INR: 2.8 — ABNORMAL HIGH (ref 0.8–1.2)
Prothrombin Time: 28.4 seconds — ABNORMAL HIGH (ref 11.4–15.2)

## 2020-08-03 MED ORDER — LOSARTAN POTASSIUM 50 MG PO TABS
50.0000 mg | ORAL_TABLET | Freq: Every day | ORAL | Status: DC
  Administered 2020-08-03 – 2020-08-08 (×6): 50 mg via ORAL
  Filled 2020-08-03 (×6): qty 1

## 2020-08-03 MED ORDER — WARFARIN SODIUM 5 MG PO TABS
5.0000 mg | ORAL_TABLET | Freq: Once | ORAL | Status: AC
  Administered 2020-08-03: 5 mg via ORAL
  Filled 2020-08-03: qty 1

## 2020-08-03 NOTE — Progress Notes (Signed)
At 09:39 Student-RN and the Clinical Nursing Instructor changed the patient's dressing that was applied to the patient's right foot and toes. Old dressing was removed with no drainage present, cellulitis was assessed, and new dressing was applied. Aqualcel dressing Kellie Simmering [phone]) was cut into 3 long strips and woven between the patient's toes. The aqualcel was then secured with kerlex and tape. Dressing was initialed, dated, and time marked.   Agree with the assessment by the nursing student. Accepted.

## 2020-08-03 NOTE — TOC Initial Note (Signed)
Transition of Care [facility]) - Initial/Assessment Note    Patient Details  Name: Rodney [name] MRN: [id number] Date of Birth: [birth year]  [facility]) CM/SW Contact:    Ella Bodo, RN Phone Number: 08/03/2020, 2:49 PM  Clinical Narrative:    Rodney [name] is a 70 y.o. male presenting with worsening erythema and swelling of right foot, concerning for cellulitis.  Met with pt to discuss home needs; he states he currently lives with his girlfriend who cares for him.  Pt currently with wound care Rt foot; discussed having HHRN come out to assist with dressing changes.  He politely refuses HH, stating that his girlfriend can do the dressing change.  Recommend having bedside nurse do teaching with girlfriend on dressing change prior to dc.                 Expected Discharge Plan: Home/Self Care Barriers to Discharge: Continued Medical Work up   Patient Goals and CMS Choice Patient states their goals for this hospitalization and ongoing recovery are:: to go home      Expected Discharge Plan and Services Expected Discharge Plan: Home/Self Care   Discharge Planning Services: CM Consult   Living arrangements for the past 2 months: Single Family Home                           HH Arranged: Patient Refused HH          Prior Living Arrangements/Services Living arrangements for the past 2 months: Single Family Home Lives with:: Significant Other Patient language and need for interpreter reviewed:: Yes Do you feel safe going back to the place where you live?: Yes      Need for Family Participation in Patient Care: Yes (Comment) Care giver support system in place?: Yes (comment) Current home services: DME Criminal Activity/Legal Involvement Pertinent to Current Situation/Hospitalization: No - Comment as needed  Activities of Daily Living Home Assistive Devices/Equipment: Prosthesis,Walker (specify type),Wheelchair,Cane (specify quad or straight) ADL Screening  (condition at time of admission) Patient's cognitive ability adequate to safely complete daily activities?: Yes Is the patient deaf or have difficulty hearing?: No Does the patient have difficulty seeing, even when wearing glasses/contacts?: No Does the patient have difficulty concentrating, remembering, or making decisions?: No Patient able to express need for assistance with ADLs?: Yes Does the patient have difficulty dressing or bathing?: No Independently performs ADLs?: Yes (appropriate for developmental age) Does the patient have difficulty walking or climbing stairs?: Yes Weakness of Legs: Right (LBKA) Weakness of Arms/Hands: None  Permission Sought/Granted                  Emotional Assessment Appearance:: Appears stated age Attitude/Demeanor/Rapport: Engaged Affect (typically observed): Appropriate Orientation: : Oriented to Self,Oriented to Place,Oriented to  Time,Oriented to Situation      Admission diagnosis:  Cellulitis [L03.90] Patient Active Problem List   Diagnosis Date Noted  . Infective myositis of right foot   . Cellulitis of foot, right 08/01/2020  . 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 CAD (coronary artery disease) 02/15/2020  . CRI (chronic renal insufficiency), stage 3 (moderate) ([place]) 02/15/2020  . Long term (current) use of anticoagulants 12/29/2019  . S/P BKA (below knee amputation) unilateral, left ([place]) 11/14/2019  . High risk social situation 11/14/2019  . Atrial fibrillation ([place]) 10/03/2019  . HFrEF (heart failure with reduced ejection fraction) ([place]) 09/28/2019  . Moderate aortic stenosis 09/28/2019  . Anemia in chronic  illness 09/27/2019  . PVD (peripheral vascular disease) ([place])   . Umbilical hernia 07/62/2633  . Obesity 10/09/2015  . Tobacco abuse 09/07/2013  . DM (diabetes mellitus), type 2 with neurological complications ([place]) 35/45/6256  . Hypercholesteremia 10/28/2010  . ERECTILE DYSFUNCTION 05/22/2009  . Essential hypertension  01/23/2009   PCP:  [name], MD Pharmacy:   [facility], [address] [address] Phone: [phone] Fax: [facility] [place], [place] [address] Phone: [phone] Fax: [phone]  [facility] [address] [place]), [place] - [address] [place] ([place]) [place] [address] Phone: [phone] Fax: [phone]     Social Determinants of Health (SDOH) Interventions    Readmission Risk Interventions No flowsheet data found.  Reinaldo Raddle, RN, BSN  Trauma/Neuro ICU Case Manager [phone]

## 2020-08-03 NOTE — Hospital Course (Addendum)
Right foot cellulitis  AKI with CKD stage 3a PAD  CAD Anemia Hypertension Atrial fibrillation  Type 2 DM HFpEF   Rodney [name] is a 70 y.o. male who presented with right foot swelling and erythema found to have right lower critical limb ischemia. 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  PAD with critical limb ischemia and cellulitis of the right lower extremity Patient was admitted from clinic with concern for cellulitis and was started on IV antibiotics with vancomycin and cefepime given history of necrotizing fasciitis s/p left BKA in 2021.  Patient was afebrile with stable vitals.  MRI of the foot obtained without evidence of osteomyelitis or septic arthritis.  ABI obtained revealed moderate right lower extremity arterial disease.  Patient underwent arteriography on 4/11 with findings of high-grade stenosis at the right common iliac which was stented, as well as occluded above-knee popliteal artery not amenable to endovascular revascularization.  He subsequently underwent right superficial femoral artery to below-knee popliteal artery bypass procedure on 4/13.  IV antibiotics were transitioned to oral doxycycline (to cover for MRSA given that blood cultures were not obtained prior to antibiotics) and cephalexin after 5 days of IV antibiotics and he was treated for a total of 10 days of antibiotics.  Prior to discharge, pain was adequately controlled on oral pain medications and patient was able to ambulate. He was evaluated by PT who recommended home health which was ordered prior to discharge.   Acute blood loss anemia On postop day 2 following right lower extremity bypass surgery, patient's hemoglobin began to drop and ultimately required 3 units pRBC to maintain a transfusion threshold of 8 given PAD.  Warfarin had been restarted following the surgery but was  stopped given need for multiple transfusions.  Patient had no clinical signs of GI bleeding and had a negative FOBT.  CT scan of the abdomen without contrast revealed 4x3.3 cm hematoma noted in the right groin/upper thigh anterior to the vessels but without notable bleeding or drainage. Patient hemoglobin was stable at 8.5 at discharge.  AKI superimposed on CKD IIIa Kidney function remained stable throughout admission until postop day 2 following right lower extremity bypass surgery, noted to have an increase in his serum creatinine which correlated with a timeframe of acute blood loss anemia.  Losartan and furosemide were held during hospitalization and also held on discharge given persistently elevated creatinine. Serum creatinine on day of discharge 2.05 with repeat labs scheduled for outpatient follow up.   Atrial fibrillation Patient's warfarin was held prior to his arteriography and right lower extremity bypass procedure.  Warfarin was restarted with LMWH to bridge following his right lower extremity bypass procedure; however, these were discontinued when his hemoglobin levels dropped requiring multiple blood transfusions.  Warfarin was restarted once his hemoglobin level stabilized with INR at goal.   T2DM Patient's blood glucose levels were well controlled on reduced dosing of his home 70/30 insulin regimen.  He was on 30 units in the morning, 15 units in the evening prior to admission which was reduced to 20 units in the morning, 10 units in the evening.  He was discharged back on his home regimen along with his home metformin.  All other issues chronic and stable.  Issues for follow up:   Scheduled at [facility] on 4/22 at 1:50 pm. GI outpatient regarding hematoma and declining hemoglobin, due for colonoscopy.  Will need CBC,  INR and BMP at hospital follow up appt.  Pentoxifylline discontinued on discharge, reassess in case this needs to be restarted. Home warfarin restarted, recheck INR at next  visit with goal 2-3.  Held ARB, spironolactone and furosemide given elevated creatinine Patient discharged with home health PT, ensure he is receiving these services.

## 2020-08-03 NOTE — Progress Notes (Signed)
At 09:39 Student-RN and the Clinical Nursing Instructor changed the patient's dressing that was applied to the patient's right foot and toes. Old dressing was removed with no drainage present, cellulitis was assessed, and new dressing was applied. Aqualcel dressing Kellie Simmering [phone]) was cut into 3 long strips and woven between the patient's toes. The aqualcel was then secured with kerlex and tape. Dressing was initialed, dated, and time marked.

## 2020-08-03 NOTE — Progress Notes (Addendum)
I have interviewed and examined the patient.  I have discussed the case with Dr. Jeannine Kitten.   I agree with their documentation and management in their note for today.  I have added my comments in red text.    LOS: 2 days    Status is: Remains inpatient appropriate because:Ongoing diagnostic testing needed not appropriate for outpatient work up and IV treatments appropriate due to intensity of illness or inability to take PO    Dispo: The patient is from: Home              Anticipated d/c is to: Home              Anticipated d/c date: 08/05/20               For questions or updates, please contact Family Medicine Teaching Service Resident On-call at pager [phone] or  [email or web address] - see pager "[place]"       Family Medicine Teaching Service Daily Progress Note Intern Pager: [phone]  Patient name: Rodney [name] Medical record number: [id number] Date of birth: Sep 04, 1950 Age: 70 y.o. Gender: male  Primary Care Provider: [name], MD Consultants: none Code Status: full  Pt Overview and Major Events to Date:  4/7 - admitted  Assessment and Plan: Rodney [name] is a 70 y.o. male who presents with right foot swelling and erythema concerning for cellulitis. 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  Right foot cellulitis Remains afebrile without leukocytosis.  Continues to have right lower extremity swelling in the setting of cellulitis and heart failure.  Continue broad-spectrum IV antibiotics given history of necrotizing fasciitis.  Unfortunately, blood cultures were not obtained prior to antibiotic initiation.  Evaluated by wound care, recommendation to consult orthopedics given underlying medical conditions. MRI shows significant subq soft tissue swelling, but no signs of abscess or charcot foot. Consider transitioning  to oral Abx.   - IV vancomycin (4/7-) - IV cefepime (4/7-) - ABI - MRI to assess for osteomyelitis and Charcot foot - elevate foot - wound care recommendations: Cut an Aquacel dressing Kellie Simmering [phone]) into a long strip. Weave between toes on right foot. Secure with a few turns of kerlex. Change daily.  - Attending recommends consultation with Orthopedics to assess depth of skin and soft tissue infection/inflammation, whether soft tissue surgical intervention is needed and whether Vascular Surgery should be asked to consult  For possible role of limb ischemia in his foot disorder.    Peripheral Artery Disease ABI (08/02/20) Right lower extremity - Brachial 141 mmHG                           Triphasic form   PTA 80       ABI 0.57                             monophasic   DP 56         ABI 0.40                             monophasic Great Toe Unable to obtain due to wounds and bandaging - Assessment/Plan:  ABI evidence of moderate to severe peripheral artery disease of the right lower extremity.  This may complicated healing  of the right foot skin and soft tissue infection & inflammation; Will await Orthopedic service recommendations whether Vascular surgery should be consulted during this admission.   - Patient is taking daily ASA 81, Last LDL 48 (09/26/19) on Atorvastain 80mg  daily, maintain his current good glycemic control (A1c 6.5%),   - Patient needs counseling on smoking cessation and exercise role in treating PAD - Goal BP <130/80 - Currently Losartan 25 daily, Lasix 40 daily, and carvedilol 12.5 twice a day           - Increase Losartan to 50 mg daily (serum creatinine 1.3 - 1.5 baseline) - monitor serum creatinine periodically during hospitalization  HTN Adequately controlled, SBP 130s-140s. - home losartan 25 mg - home carvedilol 12.5 mg BID (correctly dosed, previously taking it incorrectly once daily) - Increase Losartan from 25 to 50 mg daily. Goal BP < 130/80.  T2DM Most  recent A1c 6.5% in March, 2022.  On Metformin 1000 mg daily and NovoLog 70/30 insulin 30 units in the morning and 15 units in the evening.  Adequately controlled on reduced 70/30 insulin regimen so far. - 70/30 insulin 20 units AM, 10 units PM - SSI - hold metformin - monitor CBG  HFpEF Most recent TTE 07/30/2020 with EF 50 to 55% with G2 DD.  Lower extremity edema but breathing comfortably.  Furosemide 40 mg was recently restarted. - furosemide 40 mg PO daily - strict I/O, daily weights  Moderate AS Noted on most recent TTE.  Systolic murmur on exam consistent with moderate AS.  Persistent A Fib History of cardioversion and 09/2019 but has since recurred.  On warfarin due to cost concerns.  INR has been subtherapeutic for the last 2 months.  On amiodarone for rhythm control.  NSR currently, INR therapeutic today. - warfarin per pharmacy - cont carvedilol 12.5mg  - monitor INR - amiodarone 200 mg daily  CKD IIIa Stable,  - avoid nephrotoxic medications - monitor BMP  CAD On secondary prevention. - continue atorvastatin 80 mg daily - continue ASA 81 mg daily  FEN/GI: heart-healthy/carb-modified PPx: LMWH  Disposition: home  Subjective:  Pt states his foot feels better.  States it is much less swollen now than when he was admitted.  Discussed MRI results with patient.  Pt desires to go home today or tomorrow.    Objective: Temp:  [98.3 F (36.8 C)-98.7 F (37.1 C)] 98.5 F (36.9 C) (04/09 0303) Pulse Rate:  [64-70] 70 (04/09 0303) Resp:  [16-18] 17 (04/09 0303) BP: (135-154)/(68-73) 154/73 (04/09 0303) SpO2:  [95 %] 95 % (04/09 0303) Physical Exam: General: alert, oriented. No acute distresss.  Cardiovascular: RRR. No murmurs.  Respiratory: LCTAB.  Abdomen: soft, nontender.  Extremities: left BKA.    Laboratory: Recent Labs  Lab 08/01/20 1633 08/02/20 0259 08/03/20 0131  WBC 8.4 8.9 9.9  HGB 10.5* 11.0* 10.7*  HCT 31.9* 34.1* 33.2*  PLT 255 285 262    Recent Labs  Lab 08/01/20 1633 08/02/20 0259 08/03/20 0131  NA 134* 137 135  K 4.2 5.0 4.7  CL 102 104 102  CO2 27 27 27   BUN 23 22 20   CREATININE 1.32* 1.30* 1.38*  CALCIUM 8.6* 8.9 8.7*  PROT 5.7* 6.0*  --   BILITOT 0.3 0.5  --   ALKPHOS 56 64  --   ALT 14 14  --   AST 15 13*  --   GLUCOSE 249* 121* 136*     Imaging/Diagnostic Tests: IMPRESSION: 1. Marked dorsal subcutaneous soft  tissue swelling/edema/fluid and associated skin blistering. No discrete rim enhancing fluid collection to suggest a drainable abscess. 2. Diffuse myositis involving the foot musculature but no evidence of pyomyositis. 3. No findings for septic arthritis or osteomyelitis.  Benay Pike, MD 08/03/2020, 5:45 AM PGY-3, [place] Intern pager: [phone], text pages welcome

## 2020-08-03 NOTE — Progress Notes (Signed)
ANTICOAGULATION CONSULT NOTE - Follow Up Consult  Pharmacy Consult for Warfarin Indication: atrial fibrillation  No Known Allergies  Vital Signs: Temp: 98.5 F (36.9 C) (04/09 0800) Temp Source: Oral (04/09 0800) BP: 159/73 (04/09 0800) Pulse Rate: 76 (04/09 0800)  Labs: Recent Labs    08/01/20 1633 08/02/20 0259 08/03/20 0131 08/03/20 1249  HGB 10.5* 11.0* 10.7*  --   HCT 31.9* 34.1* 33.2*  --   PLT 255 285 262  --   LABPROT 18.8* 21.7*  --  28.4*  INR 1.6* 2.0*  --  2.8*  CREATININE 1.32* 1.30* 1.38*  --     Estimated Creatinine Clearance: 65.4 mL/min (A) (by C-G formula based on SCr of 1.38 mg/dL (H)).   Medications:  Medications Prior to Admission  Medication Sig Dispense Refill Last Dose  . amiodarone (PACERONE) 200 MG tablet Take 1 tablet (200 mg total) by mouth daily. 90 tablet 3 08/01/2020 at 1400  . aspirin EC 81 MG tablet Take 1 tablet (81 mg total) by mouth daily. Swallow whole. 90 tablet 3 08/01/2020 at 1300  . atorvastatin (LIPITOR) 80 MG tablet TAKE 1 TABLET EVERY DAY (Patient taking differently: Take 80 mg by mouth daily.) 90 tablet 3 08/01/2020 at am  . carvedilol (COREG) 12.5 MG tablet Take 1 tablet (12.5 mg total) by mouth 2 (two) times daily with a meal. (Patient taking differently: Take 12.5 mg by mouth daily.) 180 tablet 3 08/01/2020 at 1300  . furosemide (LASIX) 20 MG tablet Take 20 mg by mouth daily.   07/18/2020 at Unknown time  . insulin NPH-regular Human (NOVOLIN 70/30 RELION) (70-30) 100 UNIT/ML injection Inject 15-30 Units into the skin See admin instructions. Inject 30 units into the skin with breakfast and 15 units with supper- "until eventually replaced by Lantus"   08/01/2020 at 1400  . losartan (COZAAR) 25 MG tablet Take 1 tablet (25 mg total) by mouth at bedtime. (Patient taking differently: Take 25 mg by mouth daily.) 90 tablet 3 08/01/2020 at Unknown time  . metFORMIN (GLUCOPHAGE) 1000 MG tablet Take 0.5 tablets (500 mg total) by mouth 2 (two) times  daily with a meal. (Patient taking differently: Take 1,000 mg by mouth daily.) 180 tablet 3 08/01/2020 at Unknown time  . nicotine (NICODERM CQ - DOSED IN MG/24 HR) 7 mg/24hr patch Place 7 mg onto the skin daily.   unk  . warfarin (COUMADIN) 5 MG tablet Take 1 tablet by mouth once daily (Patient taking differently: Take 5-7.5 mg by mouth See admin instructions. Take 5 mg by mouth once a day on Sun/Tues/Thurs/Sat and 7.5 mg on Mon/Wed/Fri) 60 tablet 0 08/01/2020 at 1400  . Blood Glucose Monitoring Suppl (TRUE METRIX METER) DEVI Use to test blood sugar three times daily. 1 each 0 N/A  . Blood Glucose Monitoring Suppl (TRUE METRIX METER) w/Device KIT USE AS DIRECTED 1 kit 0 N/A  . furosemide (LASIX) 40 MG tablet Take 1 tablet (40 mg total) by mouth daily. 90 tablet 3 not yet  . glucose blood (RELION TRUE METRIX TEST STRIPS) test strip Use to test blood sugar three times per day. 300 each 3 N/A  . multivitamin (RENA-VIT) TABS tablet Take 1 tablet by mouth at bedtime.  0 unk  . spironolactone (ALDACTONE) 25 MG tablet Take 25 mg by mouth daily.   unk  . TRUEplus Lancets 33G MISC Use to test blood sugar three times per day. 300 each 3 N/A    Assessment: 70 yo M on warfarin PTA  for afib.  Pt was admitted with worsening cellulitis / toe wound and started on empiric antibiotics. INR therapeutic this morning at 2.8. Will continue home dosing regimen. No bleeding reported.  PTA dose 16m daily except 7.5776mMWF  Goal of Therapy:  INR 2-3 Monitor platelets by anticoagulation protocol: Yes   Plan:  - Warfarin 76m26mO x 1 tonight. - Continue daily INR.     Shyasia Funches L. PhaDevin GoingharmD, MBALock SpringsY2 Pharmacy Resident Weekends 7:00 am - 3:00 pm, please call ([phone]/01/16      1:43 PM  Please check AMION for all [facility] [place] numbers After 10:00 PM, call the [phone]

## 2020-08-04 ENCOUNTER — Inpatient Hospital Stay (HOSPITAL_COMMUNITY): Payer: Medicare PPO

## 2020-08-04 DIAGNOSIS — L03119 Cellulitis of unspecified part of limb: Secondary | ICD-10-CM | POA: Diagnosis not present

## 2020-08-04 DIAGNOSIS — I739 Peripheral vascular disease, unspecified: Secondary | ICD-10-CM | POA: Diagnosis not present

## 2020-08-04 DIAGNOSIS — I77810 Thoracic aortic ectasia: Secondary | ICD-10-CM | POA: Diagnosis not present

## 2020-08-04 DIAGNOSIS — R0989 Other specified symptoms and signs involving the circulatory and respiratory systems: Secondary | ICD-10-CM

## 2020-08-04 DIAGNOSIS — I70235 Atherosclerosis of native arteries of right leg with ulceration of other part of foot: Secondary | ICD-10-CM

## 2020-08-04 DIAGNOSIS — I4819 Other persistent atrial fibrillation: Secondary | ICD-10-CM | POA: Diagnosis not present

## 2020-08-04 DIAGNOSIS — I96 Gangrene, not elsewhere classified: Secondary | ICD-10-CM | POA: Diagnosis present

## 2020-08-04 DIAGNOSIS — E1149 Type 2 diabetes mellitus with other diabetic neurological complication: Secondary | ICD-10-CM | POA: Diagnosis not present

## 2020-08-04 DIAGNOSIS — Z89512 Acquired absence of left leg below knee: Secondary | ICD-10-CM | POA: Diagnosis not present

## 2020-08-04 LAB — LIPID PANEL
Cholesterol: 129 mg/dL (ref 0–200)
HDL: 42 mg/dL (ref >40)
LDL Cholesterol: 73 mg/dL (ref 0–99)
Total CHOL/HDL Ratio: 3.1 RATIO
Triglycerides: 72 mg/dL (ref <150)
VLDL: 14 mg/dL (ref 0–40)

## 2020-08-04 LAB — GLUCOSE, CAPILLARY
Glucose-Capillary: 112 mg/dL — ABNORMAL HIGH (ref 70–99)
Glucose-Capillary: 141 mg/dL — ABNORMAL HIGH (ref 70–99)
Glucose-Capillary: 155 mg/dL — ABNORMAL HIGH (ref 70–99)
Glucose-Capillary: 162 mg/dL — ABNORMAL HIGH (ref 70–99)

## 2020-08-04 MED ORDER — PENTOXIFYLLINE ER 400 MG PO TBCR
400.0000 mg | EXTENDED_RELEASE_TABLET | Freq: Three times a day (TID) | ORAL | Status: DC
  Administered 2020-08-04 – 2020-08-09 (×12): 400 mg via ORAL
  Filled 2020-08-04 (×16): qty 1

## 2020-08-04 MED ORDER — ALBUTEROL SULFATE (2.5 MG/3ML) 0.083% IN NEBU: 2.5000 mg | INHALATION_SOLUTION | Freq: Two times a day (BID) | RESPIRATORY_TRACT | Status: DC

## 2020-08-04 MED ORDER — NITROGLYCERIN 0.2 MG/HR TD PT24
0.2000 mg | MEDICATED_PATCH | Freq: Every day | TRANSDERMAL | Status: DC
  Administered 2020-08-04 – 2020-08-13 (×8): 0.2 mg via TRANSDERMAL
  Filled 2020-08-04 (×10): qty 1

## 2020-08-04 MED ORDER — ALBUTEROL SULFATE (2.5 MG/3ML) 0.083% IN NEBU
2.5000 mg | INHALATION_SOLUTION | Freq: Two times a day (BID) | RESPIRATORY_TRACT | Status: DC
  Administered 2020-08-04 – 2020-08-07 (×6): 2.5 mg via RESPIRATORY_TRACT
  Filled 2020-08-04 (×7): qty 3

## 2020-08-04 MED ORDER — VITAMIN K1 10 MG/ML IJ SOLN
5.0000 mg | Freq: Once | INTRAVENOUS | Status: AC
  Administered 2020-08-04: 5 mg via INTRAVENOUS
  Filled 2020-08-04: qty 0.5

## 2020-08-04 NOTE — Progress Notes (Signed)
Patient ID: Rodney [name], male   DOB: [birth year], 70 y.o.   MRN: [id number] I came by the bedside today to check on this patient's right foot per the request of the pain medicine service.  He is a patient that has seen my partner Dr. Sharol Given before and the patient is very familiar with Dr. Sharol Given.  Apparently he has been here for a few days on IV antibiotics due to right foot cellulitis.  There was also some blistering between his great toe and second toe.  A MRI of the right foot was performed and I did take a look at the MRI.  It did not show any evidence of osteomyelitis or drainable abscess.  On exam of his right foot, I can see where markings were performed where the patient had previously had cellulitis.  The patient told me that his right foot had been significant swollen before and he looked at it with me this morning and he said is dramatically improved.  There is no redness or swelling with his right ankle or foot today and there is no areas worrisome for needing an acute surgery.  There is Xeroform between his toes and I removed all of this.  There is some blistering underneath the right great toe but there is no purulence and no foul odor.  I placed new dry dressing between his toes and around his foot.  This can stay in place for the next 24 hours.  Given the significant improvement in cellulitis, I recommend continuing IV antibiotics for 1 more day and then transition to an oral antibiotic of choice.  He should keep his foot clean and dry and can follow-up with Dr.Duda as an outpatient in 1 week.  I will still let Dr. Sharol Given know the patient is here.

## 2020-08-04 NOTE — Consult Note (Signed)
REASON FOR CONSULT:    Peripheral vascular disease with ulcerations right foot.  The consult is requested by Dr. Sharol Given.  ASSESSMENT & PLAN:   PERIPHERAL VASCULAR DISEASE WITH ULCERATIONS RIGHT FOOT: Based on his exam he has evidence of infrainguinal arterial occlusive disease.  He is diabetic with wounds on the right foot and clearly this is a limb threatening situation.  I recommended that we proceed with arteriography. I have reviewed with the patient the indications for arteriography. In addition, I have reviewed the potential complications of arteriography including but not limited to: Bleeding, arterial injury, arterial thrombosis, dye action, renal insufficiency, or other unpredictable medical problems. I have explained to the patient that if we find disease amenable to angioplasty we could potentially address this at the same time. I have discussed the potential complications of angioplasty and stenting, including but not limited to: Bleeding, arterial thrombosis, arterial injury, dissection, or the need for surgical intervention.  Would continue to new gentle hydration in anticipation of possible arteriography.  ANTICOAGULATION: I have held his Coumadin which she is on for atrial fibrillation.  I have given him a small dose of IV vitamin K.  If his INR is reasonable tomorrow morning, we could potentially proceed with arteriography tomorrow.  If not his arteriogram will have to be delayed until his INR is down.   BILATERAL CAROTID BRUITS VERSUS TRANSMITTED HEART MURMUR: He has a systolic ejection murmur.  He also has bruits in his carotids although this may be transmitted from his heart.  I have ordered a carotid duplex scan.   Deitra Mayo, MD Office: [phone]   HPI:   Rodney [name] is a pleasant 70 y.o. male, who states that he had swelling and erythema in his right foot off and on that began about a month ago.  He subsequently developed a blister on his right great toe and  has developed some dry gangrene on the tips of the fourth and fifth toes.  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 Sharol Given.  Dr. Sharol Given saw him in consultation today with the wound on the right foot and was concerned about peripheral vascular disease.  For this reason vascular surgery was consulted.  The patient is ambulatory with his prosthesis.  He denies any history of claudication.  I do not get any history of rest pain.  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  His risk factors for peripheral vascular disease include type 2 diabetes, hypertension, hypercholesterolemia, and tobacco use.  He is cut back to third of a pack per day.  He has been smoking since he was 70 years old.  He denies any family history of premature cardiovascular disease.  He is on Coumadin for atrial fibrillation.  His INR yesterday was 2.8.  He denies fever or chills.  Past Medical History:  Diagnosis Date  . CHF (congestive heart failure) ([place])   . Coronary artery disease   . Diabetes mellitus without complication ([place])   . HLD (hyperlipidemia)   . Hypertension   . Peripheral edema 02/24/2018  . SOB (shortness of breath) 07/26/2018    Family History  Problem Relation Age of Onset  . Alcoholism Mother   . Alcoholism Father     SOCIAL HISTORY: Social History   Socioeconomic History  . Marital status: Widowed    Spouse name: Not on file  . Number of children: Not on file  . Years of education: Not on file  .  Highest education level: Not on file  Occupational History  . Not on file  Tobacco Use  . Smoking status: Current Every Day Smoker    Packs/day: 0.50    Years: 50.00    Pack years: 25.00    Types: Cigarettes  . Smokeless tobacco: Never Used  Substance and Sexual Activity  . Alcohol use: Yes    Alcohol/week: 6.0 standard drinks    Types: 6 Standard drinks or equivalent per week  . Drug use: No  . Sexual activity: Yes    Partners: Female    Comment:  monagamous stable relationship  Other Topics Concern  . Not on file  Social History Narrative  . Not on file   Social Determinants of Health   Financial Resource Strain: Not on file  Food Insecurity: Not on file  Transportation Needs: Not on file  Physical Activity: Not on file  Stress: Not on file  Social Connections: Not on file  Intimate Partner Violence: Not on file    No Known Allergies  Current Facility-Administered Medications  Medication Dose Route Frequency Provider Last Rate Last Admin  . acetaminophen (TYLENOL) tablet 650 mg  650 mg Oral Q6H PRN Simmons-Robinson, Makiera, MD   650 mg at 08/04/20 0359   Or  . acetaminophen (TYLENOL) suppository 650 mg  650 mg Rectal Q6H PRN Simmons-Robinson, Makiera, MD      . albuterol (PROVENTIL) (2.5 MG/3ML) 0.083% nebulizer solution 2.5 mg  2.5 mg Nebulization Q6H PRN McDiarmid, Blane Ohara, MD   2.5 mg at 08/04/20 0400  . amiodarone (PACERONE) tablet 200 mg  200 mg Oral Daily Simmons-Robinson, Makiera, MD   200 mg at 08/03/20 0942  . aspirin EC tablet 81 mg  81 mg Oral Daily Simmons-Robinson, Makiera, MD   81 mg at 08/03/20 0943  . atorvastatin (LIPITOR) tablet 80 mg  80 mg Oral Daily Simmons-Robinson, Makiera, MD   80 mg at 08/03/20 0942  . carvedilol (COREG) tablet 12.5 mg  12.5 mg Oral BID WC Simmons-Robinson, Makiera, MD   12.5 mg at 08/03/20 1722  . ceFEPIme (MAXIPIME) 2 g in sodium chloride 0.9 % 100 mL IVPB  2 g Intravenous Q8H McDiarmid, Blane Ohara, MD 200 mL/hr at 08/04/20 0558 2 g at 08/04/20 0558  . furosemide (LASIX) tablet 40 mg  40 mg Oral Daily Simmons-Robinson, Makiera, MD   40 mg at 08/03/20 0942  . insulin aspart (novoLOG) injection 0-9 Units  0-9 Units Subcutaneous TID WC Simmons-Robinson, Makiera, MD   2 Units at 08/03/20 1230  . insulin aspart protamine- aspart (NOVOLOG MIX 70/30) injection 10 Units  10 Units Subcutaneous Q supper Simmons-Robinson, Makiera, MD   10 Units at 08/03/20 1723  . insulin aspart protamine- aspart  (NOVOLOG MIX 70/30) injection 20 Units  20 Units Subcutaneous Q breakfast Simmons-Robinson, Makiera, MD   20 Units at 08/03/20 0758  . losartan (COZAAR) tablet 50 mg  50 mg Oral QHS McDiarmid, Blane Ohara, MD   50 mg at 08/03/20 2148  . nicotine (NICODERM CQ - dosed in mg/24 hours) patch 14 mg  14 mg Transdermal Daily Simmons-Robinson, Makiera, MD      . nitroGLYCERIN (NITRODUR - Dosed in mg/24 hr) patch 0.2 mg  0.2 mg Transdermal Daily Newt Minion, MD      . pentoxifylline (TRENTAL) CR tablet 400 mg  400 mg Oral TID WC Newt Minion, MD      . phytonadione (VITAMIN K) 5 mg in dextrose 5 % 50 mL IVPB  5 mg Intravenous Once Angelia Mould, MD      . polyethylene glycol (MIRALAX / GLYCOLAX) packet 17 g  17 g Oral Daily PRN Simmons-Robinson, Makiera, MD      . vancomycin (VANCOREADY) IVPB 1500 mg/300 mL  1,500 mg Intravenous Q24H Thurmond, Hildebran, RPH 150 mL/hr at 08/03/20 1723 1,500 mg at 08/03/20 1723    REVIEW OF SYSTEMS:  [X]  denotes positive finding, [ ]  denotes negative finding Cardiac  Comments:  Chest pain or chest pressure:    Shortness of breath upon exertion: x   Short of breath when lying flat:    Irregular heart rhythm:        Vascular    Pain in calf, thigh, or hip brought on by ambulation:    Pain in feet at night that wakes you up from your sleep:     Blood clot in your veins:    Leg swelling:         Pulmonary    Oxygen at home:    Productive cough:     Wheezing:         Neurologic    Sudden weakness in arms or legs:     Sudden numbness in arms or legs:     Sudden onset of difficulty speaking or slurred speech:    Temporary loss of vision in one eye:     Problems with dizziness:         Gastrointestinal    Blood in stool:     Vomited blood:         Genitourinary    Burning when urinating:     Blood in urine:        Psychiatric    Major depression:         Hematologic    Bleeding problems:    Problems with blood clotting too easily:        Skin     Rashes or ulcers:        Constitutional    Fever or chills:     PHYSICAL EXAM:   Vitals:   08/03/20 1345 08/03/20 1921 08/04/20 0402 08/04/20 0500  BP: 134/62 140/69 (!) 158/80   Pulse: 68 63 65   Resp: 16 16 16    Temp: 98.8 F (37.1 C) 98.3 F (36.8 C) 98.2 F (36.8 C)   TempSrc: Oral Oral Oral   SpO2: 94% 94% 96%   Weight:    105.7 kg  Height:       Body mass index is 31.6 kg/m.  GENERAL: The patient is a well-nourished male, in no acute distress. The vital signs are documented above. CARDIAC: There is a regular rate and rhythm.  He has a systolic ejection murmur. VASCULAR: He has bilateral carotid bruits although this could be his transmitted heart murmur. On the right side, which is the side of concern, he has a palpable femoral pulse.  I cannot palpate a popliteal or pedal pulses.  He has a monophasic posterior tibial signal, a monophasic anterior tibial signal, and monophasic peroneal signal.  The dorsalis pedis signal is barely audible. On the left side he has a slightly diminished femoral pulse.  He has a BKA on the left. PULMONARY: There is good air exchange bilaterally without wheezing or rales. ABDOMEN: Soft and non-tender with normal pitched bowel sounds.  I do not palpate an aneurysm although it is difficult to assess because of the size. MUSCULOSKELETAL: He has a left BKA. NEUROLOGIC: No focal weakness or  paresthesias are detected. SKIN: He has dry gangrene of the tips of the fourth and fifth toes with with a blister on the right great toe which has been debrided.      PSYCHIATRIC: The patient has a normal affect.  DATA:    ARTERIAL DOPPLER STUDY: I have independently interpreted his arterial Doppler study.  On the right side he has a monophasic dorsalis pedis and posterior tibial signal.  ABI is 57%.  Toe pressure could not be obtained.  ECHO: His left ventricular ejection fraction is estimated to be 50 to 55%.  Left ventricular has low normal  function.  The patient has calcium in the aortic valve.  He has moderate aortic stenosis with a mean gradient of 33 mmHg.   LABS: His INR yesterday was 2.8.  His creatinine is 1.38 with a GFR of 56.  His white blood cell count is 9.9.  Hemoglobin 10.7.  Platelets 262,000.

## 2020-08-04 NOTE — Progress Notes (Signed)
  Family Medicine Teaching Service Daily Progress Note Intern Pager: [phone]  Patient name: Rodney [name] Medical record number: [id number] Date of birth: [birth year] Age: 70 y.o. Gender: male  Primary Care Provider: [name], MD Consultants: none Code Status: full  Pt Overview and Major Events to Date:  4/7 - admitted  Assessment and Plan: CARLA WHILDEN is a 70 y.o. male who presents with right foot swelling and erythema concerning for cellulitis. 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  Right foot cellulitis- Consider transitioning to oral Abx. Awaiting recommendations from Dr. Sharol Given (called answering service today). - IV vancomycin (4/7-) - IV cefepime (4/7-) - elevate foot   PAD/CAD- chronic, stable - daily ASA 81 - Atorvastain 80mg  daily - counseling on smoking cessation and exercise role in treating PAD  HTN- chronic, moderately controlled. BP ranges 134-159/62-80 - continue Losartan to 50 mg daily - home carvedilol 12.5 mg BID  T2DM- chronic, stable. CBG monitoring 92-142 in past 24hrs.  - 70/30 insulin 20 units AM, 10 units PM - SSI - hold metformin  HFpEF- has not been getting daily weights but shows good urine output >1.6L yesterday - furosemide 40 mg PO daily - strict I/O, daily weights  Persistent A Fib- chronic, HR 60s-70s - warfarin per pharmacy, INR yesterday 2.8 - cont carvedilol 12.5mg  BID - amiodarone 200 mg daily  CKD IIIa- Stable,  - avoid nephrotoxic medications - monitor BMP  CAD- - continue atorvastatin 80 mg daily - continue ASA 81 mg daily  FEN/GI: heart-healthy/carb-modified PPx: LMWH  Disposition: home  Subjective:  Patient was sleeping very hard this morning.  Objective: Temp:  [98.2 F (36.8 C)-98.8 F (37.1 C)] 98.2 F (36.8 C) (04/10 0402) Pulse Rate:  [63-76] 65 (04/10  0402) Resp:  [16] 16 (04/10 0402) BP: (134-159)/(62-80) 158/80 (04/10 0402) SpO2:  [94 %-96 %] 96 % (04/10 0402) Weight:  [105.7 kg] 105.7 kg (04/10 0500) Physical Exam: Physical Exam Vitals reviewed.  Constitutional:      General: He is sleeping.     Appearance: He is obese. He is not ill-appearing.  Pulmonary:     Effort: Pulmonary effort is normal.    Laboratory: Recent Labs  Lab 08/01/20 1633 08/02/20 0259 08/03/20 0131  WBC 8.4 8.9 9.9  HGB 10.5* 11.0* 10.7*  HCT 31.9* 34.1* 33.2*  PLT 255 285 262   Recent Labs  Lab 08/01/20 1633 08/02/20 0259 08/03/20 0131  NA 134* 137 135  K 4.2 5.0 4.7  CL 102 104 102  CO2 27 27 27   BUN 23 22 20   CREATININE 1.32* 1.30* 1.38*  CALCIUM 8.6* 8.9 8.7*  PROT 5.7* 6.0*  --   BILITOT 0.3 0.5  --   ALKPHOS 56 64  --   ALT 14 14  --   AST 15 13*  --   GLUCOSE 249* 121* 136*    Imaging/Diagnostic Tests:    Richarda Osmond, DO 08/04/2020, 7:02 AM PGY-3, [place] Intern pager: [phone], text pages welcome

## 2020-08-04 NOTE — CV Procedure (Signed)
Carotid duplex completed.  Results can be found under chart review under CV PROC. 08/04/2020 1:08 PM Doy Taaffe RVT, RDMS

## 2020-08-04 NOTE — Consult Note (Signed)
ORTHOPAEDIC CONSULTATION  REQUESTING PHYSICIAN: McDiarmid, Blane Ohara, MD  Chief Complaint: Ulceration right great toe with cellulitis of the right foot.  HPI: Rodney [name] is a 70 y.o. male who presents with cellulitis of the right foot with a ischemic ulcer right great toe.  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  Patient has a history of diabetes and a history of tobacco use currently still smoking.  Past Medical History:  Diagnosis Date  . CHF (congestive heart failure) ([place])   . Coronary artery disease   . Diabetes mellitus without complication ([place])   . HLD (hyperlipidemia)   . Hypertension   . Peripheral edema 02/24/2018  . SOB (shortness of breath) 07/26/2018   Past Surgical History:  Procedure Laterality Date  . AMPUTATION Left 09/28/2019   Procedure: AMPUTATION BELOW KNEE;  Surgeon: Newt Minion, MD;  Location: [facility];  Service: Orthopedics;  Laterality: Left;  . CARDIAC CATHETERIZATION    . CARDIOVERSION N/A 10/05/2019   Procedure: CARDIOVERSION;  Surgeon: Sanda Klein, MD;  Location: [facility];  Service: Cardiovascular;  Laterality: N/A;  . LEFT HEART CATH AND CORONARY ANGIOGRAPHY N/A 10/03/2019   Procedure: LEFT HEART CATH AND CORONARY ANGIOGRAPHY;  Surgeon: Lorretta Harp, MD;  Location: [facility];  Service: Cardiovascular;  Laterality: N/A;  . TEE WITHOUT CARDIOVERSION N/A 10/05/2019   Procedure: TRANSESOPHAGEAL ECHOCARDIOGRAM (TEE);  Surgeon: Sanda Klein, MD;  Location: [facility];  Service: Cardiovascular;  Laterality: N/A;   Social History   Socioeconomic History  . Marital status: Widowed    Spouse name: Not on file  . Number of children: Not on file  . Years of education: Not on file  . Highest education level: Not on file  Occupational History  . Not on file  Tobacco Use  . Smoking status: Current Every Day Smoker    Packs/day: 0.50    Years: 50.00    Pack years: 25.00     Types: Cigarettes  . Smokeless tobacco: Never Used  Substance and Sexual Activity  . Alcohol use: Yes    Alcohol/week: 6.0 standard drinks    Types: 6 Standard drinks or equivalent per week  . Drug use: No  . Sexual activity: Yes    Partners: Female    Comment: monagamous stable relationship  Other Topics Concern  . Not on file  Social History Narrative  . Not on file   Social Determinants of Health   Financial Resource Strain: Not on file  Food Insecurity: Not on file  Transportation Needs: Not on file  Physical Activity: Not on file  Stress: Not on file  Social Connections: Not on file   Family History  Problem Relation Age of Onset  . Alcoholism Mother   . Alcoholism Father    - negative except otherwise stated in the family history section No Known Allergies Prior to Admission medications   Medication Sig Start Date End Date Taking? Authorizing Provider  amiodarone (PACERONE) 200 MG tablet Take 1 tablet (200 mg total) by mouth daily. 11/13/19  Yes Hensel, Jamal Collin, MD  aspirin EC 81 MG tablet Take 1 tablet (81 mg total) by mouth daily. Swallow whole. 11/03/19  Yes O'Neal, Cassie Freer, MD  atorvastatin (LIPITOR) 80 MG tablet TAKE 1 TABLET EVERY DAY Patient taking differently: Take 80 mg by mouth daily. 03/25/20  Yes O'Neal, Cassie Freer, MD  carvedilol (COREG) 12.5 MG tablet Take 1 tablet (12.5 mg total) by mouth 2 (two)  times daily with a meal. Patient taking differently: Take 12.5 mg by mouth daily. 11/13/19  Yes Hensel, Jamal Collin, MD  furosemide (LASIX) 20 MG tablet Take 20 mg by mouth daily. 06/11/20  Yes [provider]  insulin NPH-regular Human (NOVOLIN 70/30 RELION) (70-30) 100 UNIT/ML injection Inject 15-30 Units into the skin See admin instructions. Inject 30 units into the skin with breakfast and 15 units with supper- "until eventually replaced by Lantus"   Yes [provider]  losartan (COZAAR) 25 MG tablet Take 1 tablet (25 mg total) by mouth  at bedtime. Patient taking differently: Take 25 mg by mouth daily. 07/03/20  Yes Hensel, Jamal Collin, MD  metFORMIN (GLUCOPHAGE) 1000 MG tablet Take 0.5 tablets (500 mg total) by mouth 2 (two) times daily with a meal. Patient taking differently: Take 1,000 mg by mouth daily. 04/09/20  Yes Lattie Haw, MD  nicotine (NICODERM CQ - DOSED IN MG/24 HR) 7 mg/24hr patch Place 7 mg onto the skin daily.   Yes [provider]  warfarin (COUMADIN) 5 MG tablet Take 1 tablet by mouth once daily Patient taking differently: Take 5-7.5 mg by mouth See admin instructions. Take 5 mg by mouth once a day on Sun/Tues/Thurs/Sat and 7.5 mg on Mon/Wed/Fri 07/08/20  Yes Hilty, Nadean Corwin, MD  Blood Glucose Monitoring Suppl (TRUE METRIX METER) DEVI Use to test blood sugar three times daily. 11/20/19   [name], MD  Blood Glucose Monitoring Suppl (TRUE METRIX METER) w/Device KIT USE AS DIRECTED 03/25/20   [name], MD  furosemide (LASIX) 40 MG tablet Take 1 tablet (40 mg total) by mouth daily. 07/03/20   [name], MD  glucose blood (RELION TRUE METRIX TEST STRIPS) test strip Use to test blood sugar three times per day. 11/20/19   [name], MD  multivitamin (RENA-VIT) TABS tablet Take 1 tablet by mouth at bedtime. 04/09/20   Lattie Haw, MD  spironolactone (ALDACTONE) 25 MG tablet Take 25 mg by mouth daily. 06/11/20   [provider]  TRUEplus Lancets 33G MISC Use to test blood sugar three times per day. 11/20/19   [name], MD   MR FOOT RIGHT W WO CONTRAST  Result Date: 08/02/2020 CLINICAL DATA:  Pain and swelling and large dorsal foot blister. EXAM: MRI OF THE RIGHT FOREFOOT WITHOUT AND WITH CONTRAST TECHNIQUE: Multiplanar, multisequence MR imaging of the right foot was performed before and after the administration of intravenous contrast. CONTRAST:  57m GADAVIST GADOBUTROL 1 MMOL/ML IV SOLN COMPARISON:  Radiographs 09/26/2019 FINDINGS: Marked dorsal subcutaneous soft  tissue swelling/edema/fluid and associated skin blistering. I do not however see any discrete rim enhancing fluid collection to suggest a drainable abscess. No obvious gas is seen in the soft tissues. Diffuse myositis involving the foot musculature but no evidence of pyomyositis. The bony structures are intact. No findings suspicious for septic arthritis or osteomyelitis. Suspect remote fracture involving the proximal phalanx of the fifth toe. The major tendons and ligaments appear intact. IMPRESSION: 1. Marked dorsal subcutaneous soft tissue swelling/edema/fluid and associated skin blistering. No discrete rim enhancing fluid collection to suggest a drainable abscess. 2. Diffuse myositis involving the foot musculature but no evidence of pyomyositis. 3. No findings for septic arthritis or osteomyelitis. Electronically Signed   By: PMarijo SanesM.D.   On: 08/02/2020 21:55   VAS [place]ABI WITH/WO TBI  Result Date: 08/02/2020 LOWER EXTREMITY DOPPLER STUDY Indications: Peripheral artery disease, and RT foot blisters, 09-28-2019 Left  BKA.  Limitations: Today's exam was limited due to an open wound and bandages. Comparison Study: No prior studies available. Performing Technologist: Darlin Coco RDMS RVT  Examination Guidelines: A complete evaluation includes at minimum, Doppler waveform signals and systolic blood pressure reading at the level of bilateral brachial, anterior tibial, and posterior tibial arteries, when vessel segments are accessible. Bilateral testing is considered an integral part of a complete examination. Photoelectric Plethysmograph (PPG) waveforms and toe systolic pressure readings are included as required and additional duplex testing as needed. Limited examinations for reoccurring indications may be performed as noted.  ABI Findings: +---------+------------------+-----+----------+--------------------------------+ Right    Rt Pressure (mmHg)IndexWaveform  Comment                           +---------+------------------+-----+----------+--------------------------------+ Brachial 141                    triphasic                                  +---------+------------------+-----+----------+--------------------------------+ PTA      80                0.57 monophasic                                 +---------+------------------+-----+----------+--------------------------------+ DP       56                0.40 monophasic                                 +---------+------------------+-----+----------+--------------------------------+ Great Toe                                 Unable to obtain due to wounds                                             and bandaging                    +---------+------------------+-----+----------+--------------------------------+ +--------+------------------+-----+---------+--------+ Left    Lt Pressure (mmHg)IndexWaveform Comment  +--------+------------------+-----+---------+--------+ [id number]                    triphasic         +--------+------------------+-----+---------+--------+ PTA                                     Left BKA +--------+------------------+-----+---------+--------+ DP                                      Left BKA +--------+------------------+-----+---------+--------+ +-------+-----------+-----------+------------+------------+ ABI/TBIToday's ABIToday's TBIPrevious ABIPrevious TBI +-------+-----------+-----------+------------+------------+ Right                                                 +-------+-----------+-----------+------------+------------+  Summary: Right: Resting  right ankle-brachial index indicates moderate right lower extremity arterial disease. Unable to obtain toe pressure secondary to wounds and bandaging. Left: S/P Left BKA.  *See table(s) above for measurements and observations.  Electronically signed by Deitra Mayo MD on 08/02/2020 at 6:39:51 PM.   Final     - pertinent xrays, CT, MRI studies were reviewed and independently interpreted  Positive ROS: All other systems have been reviewed and were otherwise negative with the exception of those mentioned in the HPI and as above.  Physical Exam: General: Alert, no acute distress Psychiatric: Patient is competent for consent with normal mood and affect Lymphatic: No axillary or cervical lymphadenopathy Cardiovascular: No pedal edema Respiratory: No cyanosis, no use of accessory musculature GI: No organomegaly, abdomen is soft and non-tender    Images:  @ENCIMAGES @      Labs:  Lab Results  Component Value Date   HGBA1C 6.5 (A) 07/03/2020   HGBA1C 7.5 (H) 04/03/2020   HGBA1C 9.2 (H) 09/26/2019   REPTSTATUS PENDING 08/02/2020   REPTSTATUS PENDING 08/02/2020   CULT  08/02/2020    NO GROWTH 1 DAY Performed at [facility], [place] [address], [place], [place] [address]    CULT  08/02/2020    NO GROWTH 1 DAY Performed at [facility] [address] [place],  [address]     Lab Results  Component Value Date   ALBUMIN 2.7 (L) 08/02/2020   ALBUMIN 2.5 (L) 08/01/2020   ALBUMIN 2.7 (L) 04/09/2020    No results found for: CBC  Neurologic: Patient does not have protective sensation bilateral lower extremities.   MUSCULOSKELETAL:   Skin: Examination the skin on the right foot is wrinkling now.  The superficial skin from the blister of the right great toe was removed there is ischemic skin changes to the plantar aspect of the right great toe 2 cm in diameter.  I cannot palpate a dorsalis pedis or posterior tibial pulse.  A Doppler was used and patient has a strong biphasic anterior tibial pulse but has a dampened monophasic dorsalis pedis and posterior tibial pulse the posterior tibial pulse has a stronger signal than the anterior tibial pulse.  Ankle-brachial indices shows a ABI of the dorsalis pedis 0.4 with a monophasic flow in the ABI of the posterior tibial  shows 57% with monophasic flow.  This is consistent with the bedside exam.  Review of the MRI scan does not show osteomyelitis or deep abscess.  Assessment: Assessment: Ischemic ulcer right great toe with resolving cellulitis with small vessel disease secondary to diabetes and smoking.  Plan: I will start nitroglycerin patch over the dorsum of the foot as well as Trental to try to improve his microcirculation.  I will also consult vascular vein surgery to see if there are revascularization options.  This is a difficult microcirculatory problem.  Thank you for the consult and the opportunity to see Rodney [name], [place] [phone] 8:45 AM

## 2020-08-05 ENCOUNTER — Encounter (HOSPITAL_COMMUNITY)
Admission: EM | Disposition: A | Discharge status: Home Health | Payer: Self-pay | Source: Ambulatory Visit | Attending: Family Medicine

## 2020-08-05 ENCOUNTER — Inpatient Hospital Stay (HOSPITAL_COMMUNITY): Payer: Medicare PPO

## 2020-08-05 ENCOUNTER — Encounter (HOSPITAL_COMMUNITY): Payer: Self-pay | Admitting: Vascular Surgery

## 2020-08-05 DIAGNOSIS — I998 Other disorder of circulatory system: Secondary | ICD-10-CM

## 2020-08-05 DIAGNOSIS — I4819 Other persistent atrial fibrillation: Secondary | ICD-10-CM | POA: Diagnosis not present

## 2020-08-05 DIAGNOSIS — L03119 Cellulitis of unspecified part of limb: Secondary | ICD-10-CM | POA: Diagnosis not present

## 2020-08-05 DIAGNOSIS — M60073 Infective myositis, right foot: Secondary | ICD-10-CM | POA: Diagnosis not present

## 2020-08-05 DIAGNOSIS — Z01810 Encounter for preprocedural cardiovascular examination: Secondary | ICD-10-CM

## 2020-08-05 DIAGNOSIS — I96 Gangrene, not elsewhere classified: Secondary | ICD-10-CM | POA: Diagnosis not present

## 2020-08-05 HISTORY — PX: PERIPHERAL VASCULAR INTERVENTION: CATH118257

## 2020-08-05 HISTORY — PX: ABDOMINAL AORTOGRAM W/LOWER EXTREMITY: CATH118223

## 2020-08-05 LAB — PROTIME-INR
INR: 1.5 — ABNORMAL HIGH (ref 0.8–1.2)
Prothrombin Time: 17.4 seconds — ABNORMAL HIGH (ref 11.4–15.2)

## 2020-08-05 LAB — BASIC METABOLIC PANEL
Anion gap: 4 — ABNORMAL LOW (ref 5–15)
BUN: 22 mg/dL (ref 8–23)
CO2: 26 mmol/L (ref 22–32)
Calcium: 8.2 mg/dL — ABNORMAL LOW (ref 8.9–10.3)
Chloride: 103 mmol/L (ref 98–111)
Creatinine, Ser: 1.33 mg/dL — ABNORMAL HIGH (ref 0.61–1.24)
GFR, Estimated: 58 mL/min — ABNORMAL LOW (ref >60)
Glucose, Bld: 137 mg/dL — ABNORMAL HIGH (ref 70–99)
Potassium: 4.1 mmol/L (ref 3.5–5.1)
Sodium: 133 mmol/L — ABNORMAL LOW (ref 135–145)

## 2020-08-05 LAB — POCT ACTIVATED CLOTTING TIME
Activated Clotting Time: 249 seconds
Activated Clotting Time: 190 seconds
Activated Clotting Time: 172 seconds

## 2020-08-05 LAB — GLUCOSE, CAPILLARY
Glucose-Capillary: 151 mg/dL — ABNORMAL HIGH (ref 70–99)
Glucose-Capillary: 197 mg/dL — ABNORMAL HIGH (ref 70–99)
Glucose-Capillary: 159 mg/dL — ABNORMAL HIGH (ref 70–99)

## 2020-08-05 SURGERY — ABDOMINAL AORTOGRAM W/LOWER EXTREMITY
Anesthesia: LOCAL | Laterality: Right

## 2020-08-05 MED ORDER — CEPHALEXIN 500 MG PO CAPS
500.0000 mg | ORAL_CAPSULE | Freq: Four times a day (QID) | ORAL | Status: DC
  Administered 2020-08-05 – 2020-08-09 (×14): 500 mg via ORAL
  Filled 2020-08-05 (×14): qty 1

## 2020-08-05 MED ORDER — LIDOCAINE HCL (PF) 1 % IJ SOLN
INTRAMUSCULAR | Status: DC | PRN
  Administered 2020-08-05: 15 mL via INTRADERMAL

## 2020-08-05 MED ORDER — LABETALOL HCL 5 MG/ML IV SOLN: 10.0000 mg | INTRAVENOUS | Status: DC | PRN

## 2020-08-05 MED ORDER — FENTANYL CITRATE (PF) 100 MCG/2ML IJ SOLN
INTRAMUSCULAR | Status: AC
  Filled 2020-08-05: qty 2

## 2020-08-05 MED ORDER — SODIUM CHLORIDE 0.9 % IV SOLN: INTRAVENOUS | Status: AC

## 2020-08-05 MED ORDER — HEPARIN SODIUM (PORCINE) 1000 UNIT/ML IJ SOLN
INTRAMUSCULAR | Status: DC | PRN
  Administered 2020-08-05: 10000 [IU] via INTRAVENOUS

## 2020-08-05 MED ORDER — MIDAZOLAM HCL 2 MG/2ML IJ SOLN
INTRAMUSCULAR | Status: DC | PRN
  Administered 2020-08-05: 2 mg via INTRAVENOUS

## 2020-08-05 MED ORDER — SODIUM CHLORIDE 0.9 % IV SOLN: 250.0000 mL | INTRAVENOUS | Status: DC | PRN

## 2020-08-05 MED ORDER — SODIUM CHLORIDE 0.9 % IV SOLN: INTRAVENOUS | Status: DC

## 2020-08-05 MED ORDER — HYDRALAZINE HCL 20 MG/ML IJ SOLN: 5.0000 mg | INTRAMUSCULAR | Status: DC | PRN

## 2020-08-05 MED ORDER — MIDAZOLAM HCL 2 MG/2ML IJ SOLN
INTRAMUSCULAR | Status: AC
  Filled 2020-08-05: qty 2

## 2020-08-05 MED ORDER — SODIUM CHLORIDE 0.9% FLUSH: 3.0000 mL | INTRAVENOUS | Status: DC | PRN

## 2020-08-05 MED ORDER — HEPARIN SODIUM (PORCINE) 1000 UNIT/ML IJ SOLN
INTRAMUSCULAR | Status: AC
  Filled 2020-08-05: qty 1

## 2020-08-05 MED ORDER — LIDOCAINE HCL (PF) 1 % IJ SOLN
INTRAMUSCULAR | Status: AC
  Filled 2020-08-05: qty 30

## 2020-08-05 MED ORDER — FENTANYL CITRATE (PF) 100 MCG/2ML IJ SOLN
INTRAMUSCULAR | Status: DC | PRN
  Administered 2020-08-05: 50 ug via INTRAVENOUS

## 2020-08-05 MED ORDER — ACETAMINOPHEN 325 MG PO TABS: 650.0000 mg | ORAL_TABLET | ORAL | Status: DC | PRN

## 2020-08-05 MED ORDER — HEPARIN (PORCINE) IN NACL 1000-0.9 UT/500ML-% IV SOLN
INTRAVENOUS | Status: DC | PRN
  Administered 2020-08-05 (×2): 500 mL

## 2020-08-05 MED ORDER — DOXYCYCLINE HYCLATE 100 MG PO TABS
100.0000 mg | ORAL_TABLET | Freq: Two times a day (BID) | ORAL | Status: AC
  Administered 2020-08-05 – 2020-08-10 (×11): 100 mg via ORAL
  Filled 2020-08-05 (×11): qty 1

## 2020-08-05 MED ORDER — IODIXANOL 320 MG/ML IV SOLN
INTRAVENOUS | Status: DC | PRN
  Administered 2020-08-05: 114 mL via INTRA_ARTERIAL

## 2020-08-05 MED ORDER — SODIUM CHLORIDE 0.9% FLUSH
3.0000 mL | Freq: Two times a day (BID) | INTRAVENOUS | Status: DC
  Administered 2020-08-06 – 2020-08-13 (×14): 3 mL via INTRAVENOUS

## 2020-08-05 MED ORDER — ONDANSETRON HCL 4 MG/2ML IJ SOLN: 4.0000 mg | Freq: Four times a day (QID) | INTRAMUSCULAR | Status: DC | PRN

## 2020-08-05 MED ORDER — HEPARIN (PORCINE) IN NACL 1000-0.9 UT/500ML-% IV SOLN
INTRAVENOUS | Status: AC
  Filled 2020-08-05: qty 1000

## 2020-08-05 SURGICAL SUPPLY — 14 items
CATH OMNI FLUSH 5F 65CM (CATHETERS) ×1 IMPLANT
GLIDEWIRE ADV .035X260CM (WIRE) ×1 IMPLANT
KIT ENCORE 26 ADVANTAGE (KITS) ×1 IMPLANT
KIT MICROPUNCTURE NIT STIFF (SHEATH) ×1 IMPLANT
KIT PV (KITS) ×3 IMPLANT
MAT PREVALON FULL STRYKER (MISCELLANEOUS) ×1 IMPLANT
SHEATH FLEXOR ANSEL 1 7F 45CM (SHEATH) ×1 IMPLANT
SHEATH PINNACLE 5F 10CM (SHEATH) ×1 IMPLANT
SHEATH PROBE COVER 6X72 (BAG) ×1 IMPLANT
STENT VIABAHNBX 8X29X135 (Permanent Stent) ×1 IMPLANT
SYR MEDRAD MARK V 150ML (SYRINGE) ×1 IMPLANT
TRANSDUCER W/STOPCOCK (MISCELLANEOUS) ×3 IMPLANT
TRAY PV CATH (CUSTOM PROCEDURE TRAY) ×3 IMPLANT
WIRE BENTSON .035X145CM (WIRE) ×1 IMPLANT

## 2020-08-05 NOTE — Progress Notes (Signed)
Family Medicine Teaching Service Daily Progress Note Intern Pager: [phone]  Patient name: Rodney [name] Medical record number: [id number] Date of birth: 1950-06-17 Age: 70 y.o. Gender: male  Primary Care Provider: [name], MD Consultants: none   Code Status: Full Code  Pt Overview and Major Events to Date:  4/7 admitted  Assessment and Plan: Rodney [name] is a 70 y.o. male who presents with right foot swelling and erythema concerning for cellulitis. 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  Right foot cellulitis Transitioning to oral antibiotics today per orthopedics recommendation.  Covering for MRSA given that blood cultures were obtained prior to antibiotic initiation. - start doxycycline and cephalexin - s/p IV vancomycin (4/7-11) - s/p IV cefepime (4/7-11) - elevate foot - wound care recommendations: Cut an Aquacel dressing Kellie Simmering [phone]) into a long strip. Weave between toes on right foot. Secure with a few turns of kerlex. Change daily.  PAD Undergoing arteriography today.  On ASA and statin.  HTN Adequately controlled, SBP 110s-140s yesterday. - losartan 50 mg - home carvedilol 12.5 mg BID  T2DM Adequately controlled on current regimen. - 70/30 insulin 20 units AM, 10 units PM - hold metformin - monitor CBG  HFpEF Stable. - furosemide 40 mg daily - strict I/O, daily weights  Moderate AS Noted on most recent TTE.  Systolic murmur on exam consistent with moderate AS.  Carotid ultrasound obtained due to carotid bruits, likely transmitted from heart murmur.  Carotid ultrasound was normal.  Persistent A Fib In NSR.  On amiodarone for rhythm control.  Warfarin being held for procedure. - warfarin per pharmacy - monitor INR - amiodarone 200 mg daily  CKD IIIa Stable. - avoid nephrotoxic medications - monitor  BMP  CAD On secondary prevention. - continue atorvastatin 80 mg daily - continue ASA 81 mg daily  FEN/GI: heart-healthy/carb-modified PPx: LMWH  Disposition: med-tele  Subjective:  NAD.  Seen this morning prior to going to arteriography.  Patient states that he was not aware that he was going for this procedure until this morning.  Feeling good otherwise.  Objective: Temp:  [97.7 F (36.5 C)-98.6 F (37 C)] 98.6 F (37 C) (04/11 0340) Pulse Rate:  [58-65] 61 (04/11 0340) Resp:  [16-18] 18 (04/11 0340) BP: (114-158)/(65-80) 145/80 (04/11 0340) SpO2:  [93 %-95 %] 95 % (04/11 0340) Weight:  [104.5 kg] 104.5 kg (04/11 0500) Physical Exam: General: Obese male laying in bed comfortably, NAD Cardiovascular: RRR, 3/6 systolic murmur best heard at the LUSB radiating to carotids Respiratory: CTAB, breathing comfortably on room air Abdomen: Soft, nontender, +BS Extremities: 2+ pitting edema to right lower extremity up to mid calf, s/p left BKA  Laboratory: Recent Labs  Lab 08/01/20 1633 08/02/20 0259 08/03/20 0131  WBC 8.4 8.9 9.9  HGB 10.5* 11.0* 10.7*  HCT 31.9* 34.1* 33.2*  PLT 255 285 262   Recent Labs  Lab 08/01/20 1633 08/02/20 0259 08/03/20 0131 08/05/20 0216  NA 134* 137 135 133*  K 4.2 5.0 4.7 4.1  CL 102 104 102 103  CO2 27 27 27 26   BUN 23 22 20 22   CREATININE 1.32* 1.30* 1.38* 1.33*  CALCIUM 8.6* 8.9 8.7* 8.2*  PROT 5.7* 6.0*  --   --   BILITOT 0.3 0.5  --   --   ALKPHOS 56 64  --   --   ALT 14 14  --   --  AST 15 13*  --   --   GLUCOSE 249* 121* 136* 137*     Imaging/Diagnostic Tests: No new imaging.  Zola Button, MD 08/05/2020, 7:33 AM PGY-1, [place] Intern pager: [phone], text pages welcome

## 2020-08-05 NOTE — Plan of Care (Signed)
Pt is alert and oriented x 4, given report to cath lab, Pt not in distress, transferred to stretcher, all belongings packed, transported to cath lab,  Problem: Education: Goal: Knowledge of General Education information will improve Description: Including pain rating scale, medication(s)/side effects and non-pharmacologic comfort measures Outcome: Progressing   Problem: Health Behavior/Discharge Planning: Goal: Ability to manage health-related needs will improve Outcome: Progressing   Problem: Clinical Measurements: Goal: Ability to maintain clinical measurements within normal limits will improve Outcome: Progressing   Problem: Activity: Goal: Risk for activity intolerance will decrease Outcome: Progressing   Problem: Nutrition: Goal: Adequate nutrition will be maintained Outcome: Progressing   Problem: Elimination: Goal: Will not experience complications related to bowel motility Outcome: Progressing   Problem: Pain Managment: Goal: General experience of comfort will improve Outcome: Progressing   Problem: Safety: Goal: Ability to remain free from injury will improve Outcome: Progressing

## 2020-08-05 NOTE — Progress Notes (Signed)
[facility]      Objective (!) 145/80 61 98.6 F (37 C) (Oral) 18 95%  Intake/Output Summary (Last 24 hours) at 08/05/2020 0825 Last data filed at 08/05/2020 0344 Gross per 24 hour  Intake --  Output 1000 ml  Net -1000 ml   Vascular: Right side has a palpable femoral pulse.  I cannot palpate a popliteal or pedal pulses.  He has a monophasic posterior tibial signal, a monophasic anterior tibial signal, and monophasic peroneal signal.  The dorsalis pedis signal is barely audible. On the left side he has a slightly diminished femoral pulse.  He has a BKA on the left.  Laboratory Lab Results: Recent Labs    08/03/20 0131  WBC 9.9  HGB 10.7*  HCT 33.2*  PLT 262   BMET Recent Labs    08/03/20 0131 08/05/20 0216  NA 135 133*  K 4.7 4.1  CL 102 103  CO2 27 26  GLUCOSE 136* 137*  BUN 20 22  CREATININE 1.38* 1.33*  CALCIUM 8.7* 8.2*    COAG Lab Results  Component Value Date   INR 1.5 (H) 08/05/2020   INR 2.8 (H) 08/03/2020   INR 2.0 (H) 08/02/2020   No results found for: PTT  Assessment/Planning:  Plan aortogram, right lower extremity arteriogram for tissue loss.   Marty Heck 08/05/2020 8:25 AM --

## 2020-08-05 NOTE — Op Note (Signed)
Patient name: Rodney [name] MRN: [id number] DOB: Sep 12, 1950 Sex: male  08/01/2020 - 08/05/2020 Pre-operative Diagnosis: Critical limb ischemia of the right lower extremity with tissue loss Post-operative diagnosis:  Same Surgeon:  Marty Heck, MD Procedure Performed: 1.  Ultrasound guided access of left common femoral artery 2.  Aortogram including catheter selection of aorta 3.  Right lower extremity arteriogram with selection of third order branches 4.  Right common iliac artery angioplasty with stent placement (8 mm x 29 mm VBX) 5.  52 minutes of monitored moderate conscious sedation time  Indications: 70 year old male who was seen over the weekend by Dr. Scot Dock in consultation with tissue loss in the right lower extremity.  He had severely depressed ABIs of 0.57 monophasic at the ankle.  He presents today for arteriogram and possible intervention after risk benefits discussed.  Findings:   Aortogram showed a calcified but patent infrarenal aorta with patent renal arteries bilaterally.  On the left he had a 50% common iliac calcified stenosis.  On the right he had a high-grade greater than 80% calcified common iliac stenosis that appeared flow limiting.  The right external iliac artery was patent.  Right lower extremity runoff showed a patent common femoral and profunda with a diffusely diseased SFA with multiple subtotal occlusions from calcification.  He has an occluded above-knee popliteal artery and ultimately reconstituted a below-knee popliteal artery with a patent trifurcation.  The below knee popliteal artery and TP trunk does look mild to moderately diseased.  Dominant runoff appeared to be through the anterior tibial and posterior tibial artery given the peroneal appeared to have a mid calf occlusion.  The anterior tibial did occlude above the ankle with dominant runoff into the foot through the PT.  The right common iliac artery high-grade stenosis was stented with  an 8 mm x 29 mm VBX.  He will need evaluation for right femoropopliteal bypass   Procedure:  The patient was identified in the holding area and taken to room 8.  The patient was then placed supine on the table and prepped and draped in the usual sterile fashion.  A time out was called.  Ultrasound was used to evaluate the left common femoral artery.  It was patent .  A digital ultrasound image was acquired.  A micropuncture needle was used to access the left common femoral artery under ultrasound guidance.  An 018 wire was advanced without resistance and a micropuncture sheath was placed.  The 018 wire was removed and a benson wire was placed.  The micropuncture sheath was exchanged for a 5 french sheath.  An omniflush catheter was advanced over the wire to the level of L-1.  An abdominal angiogram was obtained.  Next, using the omniflush catheter and a benson wire, the aortic bifurcation was crossed and the catheter was placed into theright external iliac artery and right runoff was obtained.  Pertinent findings noted above but after evaluating images, I thought he would need a right common femoral to likely below-knee popliteal bypass.  Given a high-grade calcified right common iliac stenosis greater than 80% we elected to treat this.  Ultimately used a Glidewire advantage to exchange for a 7 [place] Ansell sheath over the aortic bifurcation and across the right common iliac stenosis. The patient was given 100 units/kg heparin IV. I then placed an 8 mm x 29 mm VBX in the right common iliac and pulled the sheath back.  We got multiple hand injections to identify the location  of the lesion then the stent was deployed and overinflated to get a total diameter of 8.4 mm which was the diameter of the normal-appearing artery proximal and distal to the stenosis.  The distal common iliac did appear ectatic.  With excellent results with no residual stenosis and stent widely deployed.  Ultimately I did pull the sheath back  and got a left iliac shot to make sure that I did not occluded flow in the left and he still has about a 50% calcified stenosis that we did not treat given the left leg is asymptomatic.  Wires and catheters were removed.  The sheath was pulled back.  Taken to holding to have the sheath removed.  Plan: The right common iliac high-grade stenosis was stented with a 8 mm x 29 mm VBX.  He will need evaluation for possible right common femoral to below-knee popliteal bypass.  Vein mapping is ordered.  Will discuss with Dr. Scot Dock.   Marty Heck, MD [facility] [place] Office: [phone]

## 2020-08-05 NOTE — Progress Notes (Signed)
Patient arrived from [place] lab to 4e06 CHG bath completed. Left groin level 0. Vital signs obtained and CCMD made aware. Call bell with in reach. Dorr Perrot, Bettina Gavia RN

## 2020-08-05 NOTE — Progress Notes (Signed)
ANTICOAGULATION CONSULT NOTE - Follow Up Consult  Pharmacy Consult for Warfarin Indication: atrial fibrillation  No Known Allergies  Vital Signs: Temp: 98.6 F (37 C) (04/11 0340) Temp Source: Oral (04/11 0340) BP: 145/80 (04/11 0340) Pulse Rate: 61 (04/11 0340)  Labs: Recent Labs    08/03/20 0131 08/03/20 1249 08/05/20 0216  HGB 10.7*  --   --   HCT 33.2*  --   --   PLT 262  --   --   LABPROT  --  28.4* 17.4*  INR  --  2.8* 1.5*  CREATININE 1.38*  --  1.33*    Estimated Creatinine Clearance: 66.5 mL/min (A) (by C-G formula based on SCr of 1.33 mg/dL (H)).   Medications:  Medications Prior to Admission  Medication Sig Dispense Refill Last Dose  . amiodarone (PACERONE) 200 MG tablet Take 1 tablet (200 mg total) by mouth daily. 90 tablet 3 08/01/2020 at 1400  . aspirin EC 81 MG tablet Take 1 tablet (81 mg total) by mouth daily. Swallow whole. 90 tablet 3 08/01/2020 at 1300  . atorvastatin (LIPITOR) 80 MG tablet TAKE 1 TABLET EVERY DAY (Patient taking differently: Take 80 mg by mouth daily.) 90 tablet 3 08/01/2020 at am  . carvedilol (COREG) 12.5 MG tablet Take 1 tablet (12.5 mg total) by mouth 2 (two) times daily with a meal. (Patient taking differently: Take 12.5 mg by mouth daily.) 180 tablet 3 08/01/2020 at 1300  . furosemide (LASIX) 20 MG tablet Take 20 mg by mouth daily.   07/18/2020 at Unknown time  . insulin NPH-regular Human (NOVOLIN 70/30 RELION) (70-30) 100 UNIT/ML injection Inject 15-30 Units into the skin See admin instructions. Inject 30 units into the skin with breakfast and 15 units with supper- "until eventually replaced by Lantus"   08/01/2020 at 1400  . losartan (COZAAR) 25 MG tablet Take 1 tablet (25 mg total) by mouth at bedtime. (Patient taking differently: Take 25 mg by mouth daily.) 90 tablet 3 08/01/2020 at Unknown time  . metFORMIN (GLUCOPHAGE) 1000 MG tablet Take 0.5 tablets (500 mg total) by mouth 2 (two) times daily with a meal. (Patient taking differently: Take  1,000 mg by mouth daily.) 180 tablet 3 08/01/2020 at Unknown time  . nicotine (NICODERM CQ - DOSED IN MG/24 HR) 7 mg/24hr patch Place 7 mg onto the skin daily.   unk  . warfarin (COUMADIN) 5 MG tablet Take 1 tablet by mouth once daily (Patient taking differently: Take 5-7.5 mg by mouth See admin instructions. Take 5 mg by mouth once a day on Sun/Tues/Thurs/Sat and 7.5 mg on Mon/Wed/Fri) 60 tablet 0 08/01/2020 at 1400  . Blood Glucose Monitoring Suppl (TRUE METRIX METER) DEVI Use to test blood sugar three times daily. 1 each 0 N/A  . Blood Glucose Monitoring Suppl (TRUE METRIX METER) w/Device KIT USE AS DIRECTED 1 kit 0 N/A  . furosemide (LASIX) 40 MG tablet Take 1 tablet (40 mg total) by mouth daily. 90 tablet 3 not yet  . glucose blood (RELION TRUE METRIX TEST STRIPS) test strip Use to test blood sugar three times per day. 300 each 3 N/A  . multivitamin (RENA-VIT) TABS tablet Take 1 tablet by mouth at bedtime.  0 unk  . spironolactone (ALDACTONE) 25 MG tablet Take 25 mg by mouth daily.   unk  . TRUEplus Lancets 33G MISC Use to test blood sugar three times per day. 300 each 3 N/A    Assessment: 70 yo M on warfarin PTA for afib.  PTA dose 38m daily except 7.568mMWF. Pt was admitted with worsening cellulitis / toe wound and started on empiric antibiotics. Warfarin held by [facility], 1 dose Vitamin K 5 mg on 4/10 in preparation for angiogram planned for today. INR today 1.5. Hgb 10.7, stable throughout admission. Plts 262, stable throughout admission.   Goal of Therapy:  INR 2-3 Monitor platelets by anticoagulation protocol: Yes   Plan:  - Holding warfarin today  - Will await [facility] recs for warfarin re-initiation - Continue daily INR.   MaBenna DunksPharmD Candidate, Class of 2022

## 2020-08-05 NOTE — Progress Notes (Signed)
VASCULAR LAB    Lower extremity vein mapping has been performed.  See CV proc for preliminary results.   Axil Copeman, RVT 08/05/2020, 3:50 PM

## 2020-08-05 NOTE — Progress Notes (Signed)
Site area: Left groin a 7 french long arterial sheath was removed  Site Prior to Removal:  Level 0  Pressure Applied For 20 MINUTES    Bedrest Beginning at 1310 pm X 4 hours  Manual:   Yes.    Patient Status During Pull:  stable  Post Pull Groin Site:  Level 0  Post Pull Instructions Given:  Yes.    Post Pull Pulses Present:  Yes.    Dressing Applied:  Yes.    Comments:

## 2020-08-05 NOTE — Progress Notes (Signed)
Mobility Specialist: Progress Note   08/05/20 1728  Mobility  Activity Dangled on edge of bed  Level of Assistance Independent  Assistive Device None  Mobility Response Tolerated well  Mobility performed by Mobility specialist  $Mobility charge 1 Mobility   Post-Mobility: 114 HR, 98% SpO2  Pt independent during transfer to EOB but transfer was erratic, RN notified. Pt asx during transfer. Pt sitting EOB with call bell at his side.   [facility] Jams Trickett Mobility Specialist Mobility Specialist Phone: [phone]

## 2020-08-06 DIAGNOSIS — I4819 Other persistent atrial fibrillation: Secondary | ICD-10-CM | POA: Diagnosis not present

## 2020-08-06 DIAGNOSIS — E1149 Type 2 diabetes mellitus with other diabetic neurological complication: Secondary | ICD-10-CM | POA: Diagnosis not present

## 2020-08-06 DIAGNOSIS — L03119 Cellulitis of unspecified part of limb: Secondary | ICD-10-CM | POA: Diagnosis not present

## 2020-08-06 DIAGNOSIS — I96 Gangrene, not elsewhere classified: Secondary | ICD-10-CM | POA: Diagnosis not present

## 2020-08-06 LAB — CBC
HCT: 32.9 % — ABNORMAL LOW (ref 39.0–52.0)
Hemoglobin: 10.6 g/dL — ABNORMAL LOW (ref 13.0–17.0)
MCH: 29 pg (ref 26.0–34.0)
MCHC: 32.2 g/dL (ref 30.0–36.0)
MCV: 90.1 fL (ref 80.0–100.0)
Platelets: 303 10*3/uL (ref 150–400)
RBC: 3.65 MIL/uL — ABNORMAL LOW (ref 4.22–5.81)
RDW: 12.6 % (ref 11.5–15.5)
WBC: 7.7 10*3/uL (ref 4.0–10.5)
nRBC: 0 % (ref 0.0–0.2)

## 2020-08-06 LAB — GLUCOSE, CAPILLARY
Glucose-Capillary: 159 mg/dL — ABNORMAL HIGH (ref 70–99)
Glucose-Capillary: 131 mg/dL — ABNORMAL HIGH (ref 70–99)
Glucose-Capillary: 124 mg/dL — ABNORMAL HIGH (ref 70–99)
Glucose-Capillary: 175 mg/dL — ABNORMAL HIGH (ref 70–99)

## 2020-08-06 LAB — SURGICAL PCR SCREEN
MRSA, PCR: NEGATIVE
Staphylococcus aureus: NEGATIVE

## 2020-08-06 LAB — BASIC METABOLIC PANEL
Anion gap: 6 (ref 5–15)
BUN: 18 mg/dL (ref 8–23)
CO2: 27 mmol/L (ref 22–32)
Calcium: 8.7 mg/dL — ABNORMAL LOW (ref 8.9–10.3)
Chloride: 104 mmol/L (ref 98–111)
Creatinine, Ser: 1.24 mg/dL (ref 0.61–1.24)
GFR, Estimated: >60 mL/min (ref >60)
Glucose, Bld: 161 mg/dL — ABNORMAL HIGH (ref 70–99)
Potassium: 4.2 mmol/L (ref 3.5–5.1)
Sodium: 137 mmol/L (ref 135–145)

## 2020-08-06 NOTE — H&P (View-Only) (Signed)
Progress Note    08/06/2020 7:40 AM 1 Day Post-Op  Subjective:  No major complaints this morning   Vitals:   08/05/20 2310 08/06/20 0330  BP: 140/68 (!) 145/79  Pulse: 64 69  Resp: 20 16  Temp: 98.2 F (36.8 C) 98 F (36.7 C)  SpO2: 97% 94%   Physical Exam: Cardiac: regular Lungs:  Non labored Extremities: left femoral access clean, dry and intact. Soft without swelling or hematoma Neurologic: alert and oriented  CBC    Component Value Date/Time   WBC 7.7 08/06/2020 0316   RBC 3.65 (L) 08/06/2020 0316   HGB 10.6 (L) 08/06/2020 0316   HGB 12.4 (L) 02/15/2020 0912   HCT 32.9 (L) 08/06/2020 0316   HCT 36.8 (L) 02/15/2020 0912   PLT 303 08/06/2020 0316   PLT 281 02/15/2020 0912   MCV 90.1 08/06/2020 0316   MCV 91 02/15/2020 0912   MCH 29.0 08/06/2020 0316   MCHC 32.2 08/06/2020 0316   RDW 12.6 08/06/2020 0316   RDW 12.2 02/15/2020 0912   LYMPHSABS 1.3 04/03/2020 0956   MONOABS 0.3 04/03/2020 0956   EOSABS 0.0 04/03/2020 0956   BASOSABS 0.1 04/03/2020 0956    BMET    Component Value Date/Time   NA 137 08/06/2020 0316   NA 136 07/30/2020 1357   K 4.2 08/06/2020 0316   CL 104 08/06/2020 0316   CO2 27 08/06/2020 0316   GLUCOSE 161 (H) 08/06/2020 0316   BUN 18 08/06/2020 0316   BUN 40 (H) 07/30/2020 1357   CREATININE 1.24 08/06/2020 0316   CREATININE 1.19 10/09/2015 1554   CALCIUM 8.7 (L) 08/06/2020 0316   GFRNONAA >60 08/06/2020 0316   GFRNONAA 65 10/09/2015 1554   GFRAA 52 (L) 05/01/2020 1632   GFRAA 75 10/09/2015 1554    INR    Component Value Date/Time   INR 1.5 (H) 08/05/2020 0216     Intake/Output Summary (Last 24 hours) at 08/06/2020 0740 Last data filed at 08/06/2020 0300 Gross per 24 hour  Intake 1977.02 ml  Output 500 ml  Net 1477.02 ml     Assessment/Plan:  70 y.o. male who is s/p Aortogram of aorta, Right lower extremity arteriogram with selection of third order branches, Right common iliac artery angioplasty with stent placement  (8 mm x 29 mm VBX) for CLE of RLE with non healing wound of right foot. He was found to have diffusely diseased SFA with multiple subtotal occlusions from calcification. He has an occluded above-knee popliteal artery and ultimately reconstituted a below-knee popliteal artery with a patent trifurcation. This was not amenable to endovascular revascularization.  Plan is for right femoral to AT or PT bypass tomorrow with Dr. Donzetta Matters. He will be NPO after midnight. Morning labs ordered. Continue to hold Coumadin   Karoline Caldwell, PA-C [facility] [phone] 08/06/2020 7:40 AM   I have interviewed the patient and examined the patient. I agree with the findings by the PA.  The patient has undergone successful angioplasty and stenting of the proximal right common iliac artery stenosis.  He was not a candidate for for an endovascular approach to his infrainguinal disease.  He appears to be a candidate for a right femoral to below-knee popliteal artery bypass.  His vein map shows a reasonable vein.  The surgery is scheduled for tomorrow with Dr. Donzetta Matters.  I have discussed the indications for the procedure and the potential complications with the patient and he is agreeable to proceed.  Gae Gallop, MD  [phone]  

## 2020-08-06 NOTE — Progress Notes (Signed)
Family Medicine Teaching Service Daily Progress Note Intern Pager: [phone]  Patient name: Rodney [name] Medical record number: [id number] Date of birth: [birth year] Age: 70 y.o. Gender: male  Primary Care Provider: [name], MD Consultants: none   Code Status: Full Code  Pt Overview and Major Events to Date:  4/7 admitted 4/11 arteriogram with right common iliac stenting  Assessment and Plan: Rodney [name] is a 70 y.o. male who presents with right foot swelling and erythema concerning for cellulitis. 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  Right foot cellulitis Doing well on oral antibiotics - doxycycline (4/11-) - cephalexin (4/11-) - s/p IV vancomycin (4/7-11) - s/p IV cefepime (4/7-11) - elevate foot - wound care recommendations: Cut an Aquacel dressing Kellie Simmering [phone]) into a long strip. Weave between toes on right foot. Secure with a few turns of kerlex. Change daily.  PAD Underwent arteriography yesterday and had stenting of the right common iliac.  Findings of diffusely diseased SFA with multiple subtotal occlusions and occluded above-knee popliteal artery not amenable to endovascular revascularization.  Planning for right femoral to AT or PT bypass tomorrow.  On ASA and statin. - right femoral to AT or PT bypass tomorrow 4/13 - NPO after midnight  HTN Adequately controlled, SBP 100s-140s yesterday. - losartan 50 mg - home carvedilol 12.5 mg BID  T2DM Adequately controlled on current regimen. - 70/30 insulin 20 units AM, 10 units PM - hold metformin - monitor CBG  HFpEF Stable. - furosemide 40 mg daily - strict I/O, daily weights  Persistent A Fib In NSR.  On amiodarone for rhythm control.  Warfarin being held for procedure. - warfarin per pharmacy - monitor INR - amiodarone 200 mg daily  CKD IIIa Stable. -  avoid nephrotoxic medications - monitor BMP  CAD On secondary prevention. - continue atorvastatin 80 mg daily - continue ASA 81 mg daily  FEN/GI: heart-healthy/carb-modified PPx: LMWH  Disposition: med-tele  Subjective:  NAD.  Feels good overall, no concerns at this time.  Objective: Temp:  [98 F (36.7 C)-98.5 F (36.9 C)] 98 F (36.7 C) (04/12 0330) Pulse Rate:  [61-89] 69 (04/12 0330) Resp:  [16-35] 16 (04/12 0330) BP: (108-186)/(49-107) 145/79 (04/12 0330) SpO2:  [94 %-100 %] 94 % (04/12 0330) Weight:  [104.3 kg] 104.3 kg (04/12 0500) Physical Exam: General: Obese male laying in bed comfortably, NAD Cardiovascular: RRR, 3/6 systolic murmur best heard at the LUSB Respiratory: CTAB, breathing comfortably on room air Abdomen: Soft, nontender, +BS Extremities: 1+ pitting edema to right lower extremity up to mid ankle, s/p left BKA  Laboratory: Recent Labs  Lab 08/02/20 0259 08/03/20 0131 08/06/20 0316  WBC 8.9 9.9 7.7  HGB 11.0* 10.7* 10.6*  HCT 34.1* 33.2* 32.9*  PLT 285 262 303   Recent Labs  Lab 08/01/20 1633 08/02/20 0259 08/03/20 0131 08/05/20 0216 08/06/20 0316  NA 134* 137 135 133* 137  K 4.2 5.0 4.7 4.1 4.2  CL 102 104 102 103 104  CO2 27 27 27 26 27   BUN 23 22 20 22 18   CREATININE 1.32* 1.30* 1.38* 1.33* 1.24  CALCIUM 8.6* 8.9 8.7* 8.2* 8.7*  PROT 5.7* 6.0*  --   --   --   BILITOT 0.3 0.5  --   --   --   ALKPHOS 56 64  --   --   --   ALT 14 14  --   --   --  AST 15 13*  --   --   --   GLUCOSE 249* 121* 136* 137* 161*     Imaging/Diagnostic Tests: PERIPHERAL VASCULAR CATHETERIZATION  Result Date: 08/05/2020 Patient name: Rodney [name]        MRN: [id number]        DOB: 1950/10/26        Sex: male  08/01/2020 - 08/05/2020 Pre-operative Diagnosis: Critical limb ischemia of the right lower extremity with tissue loss Post-operative diagnosis:  Same Surgeon:  Marty Heck, MD Procedure Performed: 1.  Ultrasound guided access of left  common femoral artery 2.  Aortogram including catheter selection of aorta 3.  Right lower extremity arteriogram with selection of third order branches 4.  Right common iliac artery angioplasty with stent placement (8 mm x 29 mm VBX) 5.  52 minutes of monitored moderate conscious sedation time  Indications: 70 year old male who was seen over the weekend by Dr. Scot Dock in consultation with tissue loss in the right lower extremity.  He had severely depressed ABIs of 0.57 monophasic at the ankle.  He presents today for arteriogram and possible intervention after risk benefits discussed.  Findings:  Aortogram showed a calcified but patent infrarenal aorta with patent renal arteries bilaterally.  On the left he had a 50% common iliac calcified stenosis.  On the right he had a high-grade greater than 80% calcified common iliac stenosis that appeared flow limiting.  The right external iliac artery was patent.  Right lower extremity runoff showed a patent common femoral and profunda with a diffusely diseased SFA with multiple subtotal occlusions from calcification.  He has an occluded above-knee popliteal artery and ultimately reconstituted a below-knee popliteal artery with a patent trifurcation.  The below knee popliteal artery and TP trunk does look mild to moderately diseased.  Dominant runoff appeared to be through the anterior tibial and posterior tibial artery given the peroneal appeared to have a mid calf occlusion.  The anterior tibial did occlude above the ankle with dominant runoff into the foot through the PT.  The right common iliac artery high-grade stenosis was stented with an 8 mm x 29 mm VBX.  He will need evaluation for right femoropopliteal bypass             Procedure:  The patient was identified in the holding area and taken to room 8.  The patient was then placed supine on the table and prepped and draped in the usual sterile fashion.  A time out was called.  Ultrasound was used to evaluate the  left common femoral artery.  It was patent .  A digital ultrasound image was acquired.  A micropuncture needle was used to access the left common femoral artery under ultrasound guidance.  An 018 wire was advanced without resistance and a micropuncture sheath was placed.  The 018 wire was removed and a benson wire was placed.  The micropuncture sheath was exchanged for a 5 french sheath.  An omniflush catheter was advanced over the wire to the level of L-1.  An abdominal angiogram was obtained.  Next, using the omniflush catheter and a benson wire, the aortic bifurcation was crossed and the catheter was placed into theright external iliac artery and right runoff was obtained.  Pertinent findings noted above but after evaluating images, I thought he would need a right common femoral to likely below-knee popliteal bypass.  Given a high-grade calcified right common iliac stenosis greater than 80% we elected to treat this.  Ultimately  used a Glidewire advantage to exchange for a 7 [place] Ansell sheath over the aortic bifurcation and across the right common iliac stenosis. The patient was given 100 units/kg heparin IV. I then placed an 8 mm x 29 mm VBX in the right common iliac and pulled the sheath back.  We got multiple hand injections to identify the location of the lesion then the stent was deployed and overinflated to get a total diameter of 8.4 mm which was the diameter of the normal-appearing artery proximal and distal to the stenosis.  The distal common iliac did appear ectatic.  With excellent results with no residual stenosis and stent widely deployed.  Ultimately I did pull the sheath back and got a left iliac shot to make sure that I did not occluded flow in the left and he still has about a 50% calcified stenosis that we did not treat given the left leg is asymptomatic.  Wires and catheters were removed.  The sheath was pulled back.  Taken to holding to have the sheath removed.  Plan: The right common iliac  high-grade stenosis was stented with a 8 mm x 29 mm VBX.  He will need evaluation for possible right common femoral to below-knee popliteal bypass.  Vein mapping is ordered.  Will discuss with Dr. Scot Dock.   Marty Heck, MD [facility] [place] Office: [phone]   VAS [place] LOWER EXTREMITY SAPHENOUS VEIN MAPPING  Result Date: 08/05/2020 LOWER EXTREMITY VEIN MAPPING Indications:        Pre-op History:            History of PAD; patient is pre-operative for lower extremity                     bypass graft. Other Risk Factors: Left BKA.  Comparison Study: No prior study Performing Technologist: Sharion Dove RVS  Examination Guidelines: A complete evaluation includes B-mode imaging, spectral Doppler, color Doppler, and power Doppler as needed of all accessible portions of each vessel. Bilateral testing is considered an integral part of a complete examination. Limited examinations for reoccurring indications may be performed as noted. +---------------+-----------+----------------------+---------------+-----------+   RT Diameter  RT Findings         GSV            LT Diameter  LT Findings      (cm)                                            (cm)                  +---------------+-----------+----------------------+---------------+-----------+      0.64                     Saphenofemoral         0.49                                                   Junction                                  +---------------+-----------+----------------------+---------------+-----------+      0.42  Proximal thigh         0.40                  +---------------+-----------+----------------------+---------------+-----------+      0.43                       Mid thigh            0.36       branching  +---------------+-----------+----------------------+---------------+-----------+      0.42       branching      Distal thigh          0.27                   +---------------+-----------+----------------------+---------------+-----------+      0.45                          Knee                            BKA     +---------------+-----------+----------------------+---------------+-----------+      0.42       branching       Prox calf                          BKA     +---------------+-----------+----------------------+---------------+-----------+      0.37                        Mid calf                          BKA     +---------------+-----------+----------------------+---------------+-----------+      0.35       branching      Distal calf                         BKA     +---------------+-----------+----------------------+---------------+-----------+      0.31       branching         Ankle                            BKA     +---------------+-----------+----------------------+---------------+-----------+ Diagnosing physician: Monica Martinez MD Electronically signed by Monica Martinez MD on 08/05/2020 at 4:36:00 PM.    Final      Zola Button, MD 08/06/2020, 7:33 AM PGY-1, [place] Intern pager: [phone], text pages welcome

## 2020-08-06 NOTE — Progress Notes (Addendum)
Progress Note    08/06/2020 7:40 AM 1 Day Post-Op  Subjective:  No major complaints this morning   Vitals:   08/05/20 2310 08/06/20 0330  BP: 140/68 (!) 145/79  Pulse: 64 69  Resp: 20 16  Temp: 98.2 F (36.8 C) 98 F (36.7 C)  SpO2: 97% 94%   Physical Exam: Cardiac: regular Lungs:  Non labored Extremities: left femoral access clean, dry and intact. Soft without swelling or hematoma Neurologic: alert and oriented  CBC    Component Value Date/Time   WBC 7.7 08/06/2020 0316   RBC 3.65 (L) 08/06/2020 0316   HGB 10.6 (L) 08/06/2020 0316   HGB 12.4 (L) 02/15/2020 0912   HCT 32.9 (L) 08/06/2020 0316   HCT 36.8 (L) 02/15/2020 0912   PLT 303 08/06/2020 0316   PLT 281 02/15/2020 0912   MCV 90.1 08/06/2020 0316   MCV 91 02/15/2020 0912   MCH 29.0 08/06/2020 0316   MCHC 32.2 08/06/2020 0316   RDW 12.6 08/06/2020 0316   RDW 12.2 02/15/2020 0912   LYMPHSABS 1.3 04/03/2020 0956   MONOABS 0.3 04/03/2020 0956   EOSABS 0.0 04/03/2020 0956   BASOSABS 0.1 04/03/2020 0956    BMET    Component Value Date/Time   NA 137 08/06/2020 0316   NA 136 07/30/2020 1357   K 4.2 08/06/2020 0316   CL 104 08/06/2020 0316   CO2 27 08/06/2020 0316   GLUCOSE 161 (H) 08/06/2020 0316   BUN 18 08/06/2020 0316   BUN 40 (H) 07/30/2020 1357   CREATININE 1.24 08/06/2020 0316   CREATININE 1.19 10/09/2015 1554   CALCIUM 8.7 (L) 08/06/2020 0316   GFRNONAA >60 08/06/2020 0316   GFRNONAA 65 10/09/2015 1554   GFRAA 52 (L) 05/01/2020 1632   GFRAA 75 10/09/2015 1554    INR    Component Value Date/Time   INR 1.5 (H) 08/05/2020 0216     Intake/Output Summary (Last 24 hours) at 08/06/2020 0740 Last data filed at 08/06/2020 0300 Gross per 24 hour  Intake 1977.02 ml  Output 500 ml  Net 1477.02 ml     Assessment/Plan:  71 y.o. male who is s/p Aortogram of aorta, Right lower extremity arteriogram with selection of third order branches, Right common iliac artery angioplasty with stent placement  (8 mm x 29 mm VBX) for CLE of RLE with non healing wound of right foot. He was found to have diffusely diseased SFA with multiple subtotal occlusions from calcification. He has an occluded above-knee popliteal artery and ultimately reconstituted a below-knee popliteal artery with a patent trifurcation. This was not amenable to endovascular revascularization.  Plan is for right femoral to AT or PT bypass tomorrow with Dr. Donzetta Matters. He will be NPO after midnight. Morning labs ordered. Continue to hold Coumadin   Karoline Caldwell, PA-C [facility] [phone] 08/06/2020 7:40 AM   I have interviewed the patient and examined the patient. I agree with the findings by the PA.  The patient has undergone successful angioplasty and stenting of the proximal right common iliac artery stenosis.  He was not a candidate for for an endovascular approach to his infrainguinal disease.  He appears to be a candidate for a right femoral to below-knee popliteal artery bypass.  His vein map shows a reasonable vein.  The surgery is scheduled for tomorrow with Dr. Donzetta Matters.  I have discussed the indications for the procedure and the potential complications with the patient and he is agreeable to proceed.  Gae Gallop, MD  [phone]  

## 2020-08-07 ENCOUNTER — Encounter (HOSPITAL_COMMUNITY)
Admission: EM | Disposition: A | Discharge status: Home Health | Payer: Self-pay | Source: Ambulatory Visit | Attending: Family Medicine

## 2020-08-07 ENCOUNTER — Inpatient Hospital Stay (HOSPITAL_COMMUNITY): Payer: Medicare PPO | Admitting: Anesthesiology

## 2020-08-07 ENCOUNTER — Encounter (HOSPITAL_COMMUNITY): Payer: Self-pay | Admitting: Family Medicine

## 2020-08-07 DIAGNOSIS — E1149 Type 2 diabetes mellitus with other diabetic neurological complication: Secondary | ICD-10-CM | POA: Diagnosis not present

## 2020-08-07 DIAGNOSIS — I77810 Thoracic aortic ectasia: Secondary | ICD-10-CM | POA: Diagnosis not present

## 2020-08-07 DIAGNOSIS — I4819 Other persistent atrial fibrillation: Secondary | ICD-10-CM | POA: Diagnosis not present

## 2020-08-07 DIAGNOSIS — L03119 Cellulitis of unspecified part of limb: Secondary | ICD-10-CM | POA: Diagnosis not present

## 2020-08-07 DIAGNOSIS — I998 Other disorder of circulatory system: Secondary | ICD-10-CM

## 2020-08-07 HISTORY — PX: FEMORAL-POPLITEAL BYPASS GRAFT: SHX937

## 2020-08-07 LAB — CBC
HCT: 28.1 % — ABNORMAL LOW (ref 39.0–52.0)
HCT: 28.1 % — ABNORMAL LOW (ref 39.0–52.0)
Hemoglobin: 9.2 g/dL — ABNORMAL LOW (ref 13.0–17.0)
Hemoglobin: 9.1 g/dL — ABNORMAL LOW (ref 13.0–17.0)
MCH: 29.3 pg (ref 26.0–34.0)
MCH: 29.1 pg (ref 26.0–34.0)
MCHC: 32.7 g/dL (ref 30.0–36.0)
MCHC: 32.4 g/dL (ref 30.0–36.0)
MCV: 89.5 fL (ref 80.0–100.0)
MCV: 89.8 fL (ref 80.0–100.0)
Platelets: 246 10*3/uL (ref 150–400)
Platelets: 274 10*3/uL (ref 150–400)
RBC: 3.14 MIL/uL — ABNORMAL LOW (ref 4.22–5.81)
RBC: 3.13 MIL/uL — ABNORMAL LOW (ref 4.22–5.81)
RDW: 12.7 % (ref 11.5–15.5)
RDW: 12.7 % (ref 11.5–15.5)
WBC: 7.4 10*3/uL (ref 4.0–10.5)
WBC: 12.2 10*3/uL — ABNORMAL HIGH (ref 4.0–10.5)
nRBC: 0 % (ref 0.0–0.2)
nRBC: 0 % (ref 0.0–0.2)

## 2020-08-07 LAB — POCT I-STAT 7, (LYTES, BLD GAS, ICA,H+H)
Acid-Base Excess: 0 mmol/L (ref 0.0–2.0)
Bicarbonate: 25.6 mmol/L (ref 20.0–28.0)
Calcium, Ion: 1.19 mmol/L (ref 1.15–1.40)
HCT: 25 % — ABNORMAL LOW (ref 39.0–52.0)
Hemoglobin: 8.5 g/dL — ABNORMAL LOW (ref 13.0–17.0)
O2 Saturation: 98 %
Patient temperature: 36.3
Potassium: 3.9 mmol/L (ref 3.5–5.1)
Sodium: 135 mmol/L (ref 135–145)
TCO2: 27 mmol/L (ref 22–32)
pCO2 arterial: 44.4 mmHg (ref 32.0–48.0)
pH, Arterial: 7.366 (ref 7.350–7.450)
pO2, Arterial: 111 mmHg — ABNORMAL HIGH (ref 83.0–108.0)

## 2020-08-07 LAB — POCT I-STAT, CHEM 8
BUN: 20 mg/dL (ref 8–23)
BUN: 20 mg/dL (ref 8–23)
Calcium, Ion: 1.22 mmol/L (ref 1.15–1.40)
Calcium, Ion: 1.18 mmol/L (ref 1.15–1.40)
Chloride: 100 mmol/L (ref 98–111)
Chloride: 100 mmol/L (ref 98–111)
Creatinine, Ser: 1.1 mg/dL (ref 0.61–1.24)
Creatinine, Ser: 1.2 mg/dL (ref 0.61–1.24)
Glucose, Bld: 84 mg/dL (ref 70–99)
Glucose, Bld: 88 mg/dL (ref 70–99)
HCT: 31 % — ABNORMAL LOW (ref 39.0–52.0)
HCT: 35 % — ABNORMAL LOW (ref 39.0–52.0)
Hemoglobin: 10.5 g/dL — ABNORMAL LOW (ref 13.0–17.0)
Hemoglobin: 11.9 g/dL — ABNORMAL LOW (ref 13.0–17.0)
Potassium: 3.6 mmol/L (ref 3.5–5.1)
Potassium: 3.9 mmol/L (ref 3.5–5.1)
Sodium: 137 mmol/L (ref 135–145)
Sodium: 136 mmol/L (ref 135–145)
TCO2: 27 mmol/L (ref 22–32)
TCO2: 25 mmol/L (ref 22–32)

## 2020-08-07 LAB — CULTURE, BLOOD (ROUTINE X 2)
Culture: NO GROWTH
Culture: NO GROWTH
Special Requests: ADEQUATE

## 2020-08-07 LAB — BASIC METABOLIC PANEL
Anion gap: 5 (ref 5–15)
BUN: 21 mg/dL (ref 8–23)
CO2: 27 mmol/L (ref 22–32)
Calcium: 8.5 mg/dL — ABNORMAL LOW (ref 8.9–10.3)
Chloride: 102 mmol/L (ref 98–111)
Creatinine, Ser: 1.22 mg/dL (ref 0.61–1.24)
GFR, Estimated: >60 mL/min (ref >60)
Glucose, Bld: 135 mg/dL — ABNORMAL HIGH (ref 70–99)
Potassium: 4 mmol/L (ref 3.5–5.1)
Sodium: 134 mmol/L — ABNORMAL LOW (ref 135–145)

## 2020-08-07 LAB — CREATININE, SERUM
Creatinine, Ser: 1.45 mg/dL — ABNORMAL HIGH (ref 0.61–1.24)
GFR, Estimated: 52 mL/min — ABNORMAL LOW (ref >60)

## 2020-08-07 LAB — GLUCOSE, CAPILLARY
Glucose-Capillary: 133 mg/dL — ABNORMAL HIGH (ref 70–99)
Glucose-Capillary: 81 mg/dL (ref 70–99)
Glucose-Capillary: 106 mg/dL — ABNORMAL HIGH (ref 70–99)
Glucose-Capillary: 91 mg/dL (ref 70–99)

## 2020-08-07 LAB — PREPARE RBC (CROSSMATCH)

## 2020-08-07 SURGERY — BYPASS GRAFT FEMORAL-POPLITEAL ARTERY
Anesthesia: General | Site: Leg Upper | Laterality: Right

## 2020-08-07 MED ORDER — PROPOFOL 10 MG/ML IV BOLUS
INTRAVENOUS | Status: DC | PRN
  Administered 2020-08-07: 200 mg via INTRAVENOUS

## 2020-08-07 MED ORDER — AMISULPRIDE (ANTIEMETIC) 5 MG/2ML IV SOLN
INTRAVENOUS | Status: AC
  Filled 2020-08-07: qty 2

## 2020-08-07 MED ORDER — PHENYLEPHRINE HCL-NACL 10-0.9 MG/250ML-% IV SOLN
INTRAVENOUS | Status: DC | PRN
  Administered 2020-08-07: 40 ug/min via INTRAVENOUS

## 2020-08-07 MED ORDER — OXYCODONE-ACETAMINOPHEN 5-325 MG PO TABS
1.0000 | ORAL_TABLET | ORAL | Status: DC | PRN
  Administered 2020-08-07 – 2020-08-13 (×19): 2 via ORAL
  Filled 2020-08-07 (×21): qty 2

## 2020-08-07 MED ORDER — LACTATED RINGERS IV SOLN: INTRAVENOUS | Status: DC | PRN

## 2020-08-07 MED ORDER — CHLORHEXIDINE GLUCONATE 0.12 % MT SOLN
15.0000 mL | Freq: Once | OROMUCOSAL | Status: AC
  Administered 2020-08-07: 15 mL via OROMUCOSAL
  Filled 2020-08-07: qty 15

## 2020-08-07 MED ORDER — LIDOCAINE 2% (20 MG/ML) 5 ML SYRINGE
INTRAMUSCULAR | Status: AC
  Filled 2020-08-07: qty 5

## 2020-08-07 MED ORDER — MEPERIDINE HCL 25 MG/ML IJ SOLN: 6.2500 mg | INTRAMUSCULAR | Status: DC | PRN

## 2020-08-07 MED ORDER — HYDROMORPHONE HCL 1 MG/ML IJ SOLN
INTRAMUSCULAR | Status: AC
  Filled 2020-08-07: qty 1

## 2020-08-07 MED ORDER — LIDOCAINE 2% (20 MG/ML) 5 ML SYRINGE
INTRAMUSCULAR | Status: DC | PRN
  Administered 2020-08-07: 100 mg via INTRAVENOUS

## 2020-08-07 MED ORDER — 0.9 % SODIUM CHLORIDE (POUR BTL) OPTIME
TOPICAL | Status: DC | PRN
  Administered 2020-08-07: 2000 mL

## 2020-08-07 MED ORDER — GUAIFENESIN-DM 100-10 MG/5ML PO SYRP: 15.0000 mL | ORAL_SOLUTION | ORAL | Status: DC | PRN

## 2020-08-07 MED ORDER — AMISULPRIDE (ANTIEMETIC) 5 MG/2ML IV SOLN: 10.0000 mg | Freq: Once | INTRAVENOUS | Status: DC

## 2020-08-07 MED ORDER — MIDAZOLAM HCL 5 MG/5ML IJ SOLN
INTRAMUSCULAR | Status: DC | PRN
  Administered 2020-08-07: 2 mg via INTRAVENOUS

## 2020-08-07 MED ORDER — NOREPINEPHRINE 4 MG/250ML-% IV SOLN
INTRAVENOUS | Status: AC
  Filled 2020-08-07: qty 250

## 2020-08-07 MED ORDER — HEPARIN SODIUM (PORCINE) 1000 UNIT/ML IJ SOLN
INTRAMUSCULAR | Status: DC | PRN
  Administered 2020-08-07: 10000 [IU] via INTRAVENOUS

## 2020-08-07 MED ORDER — HEMOSTATIC AGENTS (NO CHARGE) OPTIME
TOPICAL | Status: DC | PRN
  Administered 2020-08-07: 1 via TOPICAL

## 2020-08-07 MED ORDER — ALUM & MAG HYDROXIDE-SIMETH 200-200-20 MG/5ML PO SUSP: 15.0000 mL | ORAL | Status: DC | PRN

## 2020-08-07 MED ORDER — DEXMEDETOMIDINE (PRECEDEX) IN NS 20 MCG/5ML (4 MCG/ML) IV SYRINGE
PREFILLED_SYRINGE | INTRAVENOUS | Status: DC | PRN
  Administered 2020-08-07: 20 ug via INTRAVENOUS

## 2020-08-07 MED ORDER — MAGNESIUM SULFATE 2 GM/50ML IV SOLN: 2.0000 g | Freq: Every day | INTRAVENOUS | Status: DC | PRN

## 2020-08-07 MED ORDER — SODIUM CHLORIDE 0.9 % IV SOLN
INTRAVENOUS | Status: AC
  Filled 2020-08-07: qty 1.2

## 2020-08-07 MED ORDER — PHENOL 1.4 % MT LIQD: 1.0000 | OROMUCOSAL | Status: DC | PRN

## 2020-08-07 MED ORDER — SODIUM CHLORIDE 0.9 % IV SOLN: 500.0000 mL | Freq: Once | INTRAVENOUS | Status: DC | PRN

## 2020-08-07 MED ORDER — MIDAZOLAM HCL 2 MG/2ML IJ SOLN
INTRAMUSCULAR | Status: AC
  Filled 2020-08-07: qty 2

## 2020-08-07 MED ORDER — PANTOPRAZOLE SODIUM 40 MG PO TBEC
40.0000 mg | DELAYED_RELEASE_TABLET | Freq: Every day | ORAL | Status: DC
  Administered 2020-08-08 – 2020-08-13 (×6): 40 mg via ORAL
  Filled 2020-08-07 (×6): qty 1

## 2020-08-07 MED ORDER — CEFAZOLIN SODIUM-DEXTROSE 2-3 GM-%(50ML) IV SOLR
INTRAVENOUS | Status: DC | PRN
  Administered 2020-08-07: 2 g via INTRAVENOUS

## 2020-08-07 MED ORDER — PROPOFOL 10 MG/ML IV BOLUS
INTRAVENOUS | Status: AC
  Filled 2020-08-07: qty 20

## 2020-08-07 MED ORDER — ROCURONIUM BROMIDE 100 MG/10ML IV SOLN
INTRAVENOUS | Status: DC | PRN
  Administered 2020-08-07: 40 mg via INTRAVENOUS
  Administered 2020-08-07: 20 mg via INTRAVENOUS
  Administered 2020-08-07: 60 mg via INTRAVENOUS

## 2020-08-07 MED ORDER — ALBUMIN HUMAN 5 % IV SOLN: INTRAVENOUS | Status: DC | PRN

## 2020-08-07 MED ORDER — FENTANYL CITRATE (PF) 250 MCG/5ML IJ SOLN
INTRAMUSCULAR | Status: AC
  Filled 2020-08-07: qty 5

## 2020-08-07 MED ORDER — DROPERIDOL 2.5 MG/ML IJ SOLN: 0.6250 mg | Freq: Once | INTRAMUSCULAR | Status: DC | PRN

## 2020-08-07 MED ORDER — POTASSIUM CHLORIDE CRYS ER 20 MEQ PO TBCR: 20.0000 meq | EXTENDED_RELEASE_TABLET | Freq: Every day | ORAL | Status: DC | PRN

## 2020-08-07 MED ORDER — ORAL CARE MOUTH RINSE: 15.0000 mL | Freq: Once | OROMUCOSAL | Status: AC

## 2020-08-07 MED ORDER — KETAMINE HCL 50 MG/5ML IJ SOSY
PREFILLED_SYRINGE | INTRAMUSCULAR | Status: AC
  Filled 2020-08-07: qty 5

## 2020-08-07 MED ORDER — DOCUSATE SODIUM 100 MG PO CAPS
100.0000 mg | ORAL_CAPSULE | Freq: Every day | ORAL | Status: DC
  Administered 2020-08-08: 100 mg via ORAL
  Filled 2020-08-07: qty 1

## 2020-08-07 MED ORDER — ONDANSETRON HCL 4 MG/2ML IJ SOLN
INTRAMUSCULAR | Status: DC | PRN
  Administered 2020-08-07: 4 mg via INTRAVENOUS

## 2020-08-07 MED ORDER — FENTANYL CITRATE (PF) 250 MCG/5ML IJ SOLN
INTRAMUSCULAR | Status: DC | PRN
  Administered 2020-08-07 (×2): 50 ug via INTRAVENOUS
  Administered 2020-08-07: 100 ug via INTRAVENOUS
  Administered 2020-08-07: 50 ug via INTRAVENOUS

## 2020-08-07 MED ORDER — ESMOLOL HCL 100 MG/10ML IV SOLN
INTRAVENOUS | Status: AC
  Filled 2020-08-07: qty 10

## 2020-08-07 MED ORDER — LACTATED RINGERS IV SOLN: INTRAVENOUS | Status: DC

## 2020-08-07 MED ORDER — ROCURONIUM BROMIDE 10 MG/ML (PF) SYRINGE
PREFILLED_SYRINGE | INTRAVENOUS | Status: AC
  Filled 2020-08-07: qty 10

## 2020-08-07 MED ORDER — SENNOSIDES-DOCUSATE SODIUM 8.6-50 MG PO TABS: 1.0000 | ORAL_TABLET | Freq: Every evening | ORAL | Status: DC | PRN

## 2020-08-07 MED ORDER — PROTAMINE SULFATE 10 MG/ML IV SOLN
INTRAVENOUS | Status: DC | PRN
  Administered 2020-08-07: 50 mg via INTRAVENOUS

## 2020-08-07 MED ORDER — SUGAMMADEX SODIUM 200 MG/2ML IV SOLN
INTRAVENOUS | Status: DC | PRN
  Administered 2020-08-07: 200 mg via INTRAVENOUS

## 2020-08-07 MED ORDER — LABETALOL HCL 5 MG/ML IV SOLN: 10.0000 mg | INTRAVENOUS | Status: DC | PRN

## 2020-08-07 MED ORDER — HYDROMORPHONE HCL 1 MG/ML IJ SOLN
0.5000 mg | INTRAMUSCULAR | Status: DC | PRN
  Administered 2020-08-07: 1 mg via INTRAVENOUS
  Filled 2020-08-07: qty 1

## 2020-08-07 MED ORDER — SODIUM CHLORIDE 0.9 % IV SOLN: INTRAVENOUS | Status: DC

## 2020-08-07 MED ORDER — HEPARIN SODIUM (PORCINE) 5000 UNIT/ML IJ SOLN
5000.0000 [IU] | Freq: Three times a day (TID) | INTRAMUSCULAR | Status: DC
  Administered 2020-08-08: 5000 [IU] via SUBCUTANEOUS
  Filled 2020-08-07: qty 1

## 2020-08-07 MED ORDER — FLEET ENEMA 7-19 GM/118ML RE ENEM: 1.0000 | ENEMA | Freq: Once | RECTAL | Status: DC | PRN

## 2020-08-07 MED ORDER — ONDANSETRON HCL 4 MG/2ML IJ SOLN
INTRAMUSCULAR | Status: AC
  Filled 2020-08-07: qty 2

## 2020-08-07 MED ORDER — EPHEDRINE SULFATE-NACL 50-0.9 MG/10ML-% IV SOSY
PREFILLED_SYRINGE | INTRAVENOUS | Status: DC | PRN
  Administered 2020-08-07 (×3): 5 mg via INTRAVENOUS
  Administered 2020-08-07: 10 mg via INTRAVENOUS
  Administered 2020-08-07 (×2): 5 mg via INTRAVENOUS

## 2020-08-07 MED ORDER — SODIUM CHLORIDE 0.9 % IV SOLN
INTRAVENOUS | Status: DC | PRN
  Administered 2020-08-07: 500 mL

## 2020-08-07 MED ORDER — HYDROMORPHONE HCL 1 MG/ML IJ SOLN
0.2500 mg | INTRAMUSCULAR | Status: DC | PRN
  Administered 2020-08-07 (×2): 0.5 mg via INTRAVENOUS

## 2020-08-07 MED ORDER — BISACODYL 5 MG PO TBEC: 5.0000 mg | DELAYED_RELEASE_TABLET | Freq: Every day | ORAL | Status: DC | PRN

## 2020-08-07 MED ORDER — CEFAZOLIN SODIUM-DEXTROSE 2-4 GM/100ML-% IV SOLN
INTRAVENOUS | Status: AC
  Filled 2020-08-07: qty 100

## 2020-08-07 MED ORDER — HEPARIN SODIUM (PORCINE) 5000 UNIT/ML IJ SOLN: 5000.0000 [IU] | Freq: Three times a day (TID) | INTRAMUSCULAR | Status: DC

## 2020-08-07 SURGICAL SUPPLY — 43 items
ADH SKN CLS APL DERMABOND .7 (GAUZE/BANDAGES/DRESSINGS) ×2
BANDAGE ESMARK 6X9 LF (GAUZE/BANDAGES/DRESSINGS) IMPLANT
BNDG CMPR 9X6 STRL LF SNTH (GAUZE/BANDAGES/DRESSINGS)
BNDG ESMARK 6X9 LF (GAUZE/BANDAGES/DRESSINGS)
CANISTER SUCT 3000ML PPV (MISCELLANEOUS) ×2 IMPLANT
CANNULA VESSEL 3MM 2 BLNT TIP (CANNULA) ×1 IMPLANT
CLIP VESOCCLUDE MED 24/CT (CLIP) ×2 IMPLANT
CLIP VESOCCLUDE SM WIDE 24/CT (CLIP) ×2 IMPLANT
CUFF TOURN SGL QUICK 24 (TOURNIQUET CUFF)
CUFF TOURN SGL QUICK 34 (TOURNIQUET CUFF)
CUFF TOURN SGL QUICK 42 (TOURNIQUET CUFF) IMPLANT
CUFF TRNQT CYL 24X4X16.5-23 (TOURNIQUET CUFF) IMPLANT
CUFF TRNQT CYL 34X4.125X (TOURNIQUET CUFF) IMPLANT
DERMABOND ADVANCED (GAUZE/BANDAGES/DRESSINGS) ×2
DERMABOND ADVANCED .7 DNX12 (GAUZE/BANDAGES/DRESSINGS) ×1 IMPLANT
ELECT REM PT RETURN 9FT ADLT (ELECTROSURGICAL) ×2
ELECTRODE REM PT RTRN 9FT ADLT (ELECTROSURGICAL) ×1 IMPLANT
GLOVE BIO SURGEON STRL SZ7.5 (GLOVE) ×2 IMPLANT
GOWN STRL REUS W/ TWL LRG LVL3 (GOWN DISPOSABLE) ×2 IMPLANT
GOWN STRL REUS W/ TWL XL LVL3 (GOWN DISPOSABLE) ×1 IMPLANT
GOWN STRL REUS W/TWL LRG LVL3 (GOWN DISPOSABLE) ×4
GOWN STRL REUS W/TWL XL LVL3 (GOWN DISPOSABLE) ×2
KIT BASIN OR (CUSTOM PROCEDURE TRAY) ×2 IMPLANT
KIT TURNOVER KIT B (KITS) ×2 IMPLANT
NS IRRIG 1000ML POUR BTL (IV SOLUTION) ×4 IMPLANT
PACK PERIPHERAL VASCULAR (CUSTOM PROCEDURE TRAY) ×2 IMPLANT
PAD ARMBOARD 7.5X6 YLW CONV (MISCELLANEOUS) ×4 IMPLANT
PENCIL BUTTON HOLSTER BLD 10FT (ELECTRODE) ×1 IMPLANT
POWDER SURGICEL 3.0 GRAM (HEMOSTASIS) ×1 IMPLANT
SUT MNCRL AB 4-0 PS2 18 (SUTURE) ×6 IMPLANT
SUT PROLENE 5 0 C 1 24 (SUTURE) ×3 IMPLANT
SUT PROLENE 6 0 BV (SUTURE) ×10 IMPLANT
SUT SILK 2 0 SH (SUTURE) ×2 IMPLANT
SUT SILK 3 0 (SUTURE) ×4
SUT SILK 3-0 18XBRD TIE 12 (SUTURE) IMPLANT
SUT VIC AB 2-0 CT1 27 (SUTURE) ×6
SUT VIC AB 2-0 CT1 TAPERPNT 27 (SUTURE) ×2 IMPLANT
SUT VIC AB 3-0 SH 27 (SUTURE) ×8
SUT VIC AB 3-0 SH 27X BRD (SUTURE) ×2 IMPLANT
TOWEL GREEN STERILE (TOWEL DISPOSABLE) ×2 IMPLANT
TRAY FOLEY MTR SLVR 16FR STAT (SET/KITS/TRAYS/PACK) ×2 IMPLANT
UNDERPAD 30X36 HEAVY ABSORB (UNDERPADS AND DIAPERS) ×2 IMPLANT
WATER STERILE IRR 1000ML POUR (IV SOLUTION) ×2 IMPLANT

## 2020-08-07 NOTE — Op Note (Signed)
Patient name: Rodney [name] MRN: [id number] DOB: 03-21-1951 Sex: male  08/07/2020 Pre-operative Diagnosis: critical right lower extremity ischemia Post-operative diagnosis:  Same Surgeon:  Erlene Quan C. Donzetta Matters, MD Assistant: Fortunato Curling, MD; Laurence Slate, PA Procedure Performed: 1.  Harvest right greater saphenous vein 2.  Right superficial femoral artery to below-knee popliteal artery bypass with ipsilateral, translocated, nonreversed greater saphenous vein  Indications: 69 year old male with wound on his right great toe status post right common iliac artery stent.  He now has a common femoral pulse on the right.  He has an occluded SFA distally reconstitutes below-knee popliteal artery.  He also has proximal diseased SFA.  After several centimeters.  He is indicated for bypass.  Assistants were necessary to expedite the case.  Findings: The common femoral artery had a very strong pulse and was soft.  The SFA was soft for several centimeters with a very strong pulse.  The bifurcation the common femoral artery was actually high near the inguinal ligament given the patient's size the anastomosis was to the SFA in an end-to-end fashion.  Below the knee popliteal artery was also soft amenable to clamping.  The vein was large however did appear to have some scarring.  There was 1 area that was injured and was repaired.  We were able to use the vein and had very strong pulsatile inflow.  At completion there was good posterior tibial and anterior tibial artery signals at the ankle these were augmented with clamping of the graft.   Procedure:  The patient was identified in the holding area and taken to the operating was put supine operative when general anesthesia was induced.  He was sterilely prepped draped in the right lower extremity usual fashion, antibiotics were ministered timeout was called.  Ultrasound was used to identify his saphenous vein marked this throughout his leg.  There is also a  large branch this did take away some of the size above the knee all the way to below the knee.  Concomitant groin incisions I was transverse and below the knee incision was made.  In the groin we dissected down through the skin subcutaneous tissue to the fascia identified the common femoral artery.  This was actually up under the inguinal ligament.  We divided the crossing vein over the profunda.  I elected at that time I can probably bypass him into the SFA given that this was soft and below the inguinal ligament.  Vessel loop was placed around the SFA.  At the same time below the knee dissected down through the fascia.  The popliteal vein was retracted medially the popliteal artery was identified and vessel loop placed around it.  This appeared soft and amenable for clamping.  Through same incisions we then began tracing the greater saphenous vein.  Counterincisions were made above the knee and we were able to dissected out the vein entirely.  1 area of the vein was injured this was more likely due to scarring.  I made stab incision distally on the leg and tied off the vein.  I then removed the vein through all the incisions back to the saphenofemoral junction where it was clamped with a side-biting clamp and divided.  I oversewed the saphenofemoral junction with running 5-0 Prolene suture in a mattress fashion.  I then tunneled a tunnel between the below-knee popliteal and groin incision in the subsartorial plane and the patient was fully heparinized.  I clamped the SFA distally and proximally.  I transected the  SFA.  There was minimal calcification there.  I spatulated.  I then spatulated the vein sewn end-to-end with 6-0 Prolene suture.  Upon completion we released our clamps we had good flow in the proximal aspect of our vein.  We did have to repair 1 area that was torn during harvest this was significantly scarred area but did repair nicely.  I then used a valvulotome remove the valves and had pulsatile flow  all the way to the foot.  I clamped it doubly on the distal end marked it for orientation.  It was then tunneled in a subsartorial plane keeping the orientation.  Distally we then clamped the popliteal artery distally and proximally opened longitudinally.  We had good inflow and outflow we released our clamps.  We irrigated with heparinized saline.  We then straighten the leg trimmed the graft to size spatulated sewn end-to-side with 6-0 Prolene suture.  Prior to completion we allowed flushing all directions.  Upon completion there was good palpable pulse in the distal popliteal artery.  We had good signal in there as well.  There was a strong signal in the anterior tibial and posterior tibial arteries this augmented with compression of the graft.  Satisfied with this we administered 50 mg protamine.  We obtained stasis in all 4 wounds and thoroughly irrigated.  The wounds were all closed with deep Vicryl and 4 Monocryl at the level of skin.  Dermabond is placed at the level of skin.  Doppler signals were checked again with very strong posterior tibial anterior tibial signals.  He was then awakened from anesthesia having tolerated procedure well without any complication all counts were correct at completion.  EBL: 250cc  Sameena Artus C. Donzetta Matters, MD [facility] Office: [phone] Pager: [phone]

## 2020-08-07 NOTE — Interval H&P Note (Signed)
History and Physical Interval Note:  08/07/2020 1:28 PM  Rodney [name]  has presented today for surgery, with the diagnosis of PAD.  The various methods of treatment have been discussed with the patient and family. After consideration of risks, benefits and other options for treatment, the patient has consented to  Procedure(s): RIGHT FEMORAL TO POPLITEAL VERSUS ANTERIOR TIBIAL ARTERY BYPASS (Right) as a surgical intervention.  The patient's history has been reviewed, patient examined, no change in status, stable for surgery.  I have reviewed the patient's chart and labs.  Questions were answered to the patient's satisfaction.     Servando Snare

## 2020-08-07 NOTE — Transfer of Care (Signed)
Immediate Anesthesia Transfer of Care Note  Patient: Rodney [name]  Procedure(s) Performed: RIGHT FEMORAL TO BELOW KNEE POPLITEAL ARTERY BYPASS (Right Leg Upper)  Patient Location: PACU  Anesthesia Type:General  Level of Consciousness: awake, alert  and oriented  Airway & Oxygen Therapy: Patient Spontanous Breathing  Post-op Assessment: Report given to RN and Post -op Vital signs reviewed and stable  Post vital signs: Reviewed and stable  Last Vitals:  Vitals Value Taken Time  BP    Temp    Pulse 69 08/07/20 1724  Resp    SpO2 91 % 08/07/20 1724  Vitals shown include unvalidated device data.  Last Pain:  Vitals:   08/07/20 1307  TempSrc:   PainSc: 0-No pain      Patients Stated Pain Goal: 0 (80/63/86 8548)  Complications: No complications documented.

## 2020-08-07 NOTE — Anesthesia Postprocedure Evaluation (Signed)
Anesthesia Post Note  Patient: Rodney [name]  Procedure(s) Performed: RIGHT FEMORAL TO BELOW KNEE POPLITEAL ARTERY BYPASS (Right Leg Upper)     Patient location during evaluation: PACU Anesthesia Type: General Level of consciousness: sedated and patient cooperative Pain management: pain level controlled Vital Signs Assessment: post-procedure vital signs reviewed and stable Respiratory status: spontaneous breathing Cardiovascular status: stable Anesthetic complications: no   No complications documented.  Last Vitals:  Vitals:   08/07/20 1815 08/07/20 1844  BP: 139/68 (!) 141/61  Pulse: 60 63  Resp: 16 15  Temp: (!) 36.1 C 36.7 C  SpO2: 100% 97%    Last Pain:  Vitals:   08/07/20 1844  TempSrc: Oral  PainSc: 10-Worst pain ever                 Nolon Nations

## 2020-08-07 NOTE — Anesthesia Preprocedure Evaluation (Addendum)
Anesthesia Evaluation  Patient identified by MRN, date of birth, ID band Patient awake    Reviewed: Allergy & Precautions, NPO status , Patient's Chart, lab work & pertinent test results  History of Anesthesia Complications Negative for: history of anesthetic complications  Airway Mallampati: III  TM Distance: >3 FB Neck ROM: Full    Dental  (+) Poor Dentition, Dental Advisory Given, Missing, Chipped,    Pulmonary Current Smoker and Patient abstained from smoking., former smoker,    Pulmonary exam normal breath sounds clear to auscultation   + intubated    Cardiovascular hypertension, Pt. on medications + CAD, + Peripheral Vascular Disease and +CHF  Normal cardiovascular exam+ dysrhythmias Atrial Fibrillation + Valvular Problems/Murmurs AS  Rhythm:Regular Rate:Normal  Echo 07/2020 1. Left ventricular ejection fraction, by estimation, is 50 to 55%. The left ventricle has low normal function. The left ventricle has no regional wall motion abnormalities. There is moderate left ventricular hypertrophy. Left ventricular diastolic parameters are consistent with Grade II diastolic dysfunction (pseudonormalization).  2. Right ventricular systolic function is normal. The right ventricular size is moderately enlarged. There is normal pulmonary artery systolic pressure. The estimated right ventricular systolic pressure is 99.8 mmHg.  3. Left atrial size was severely dilated.  4. Right atrial size was severely dilated.  5. The mitral valve is normal in structure. Mild mitral valve  regurgitation. No evidence of mitral stenosis. The mean mitral valve gradient is 2.0 mmHg. Moderate mitral annular calcification.  6. The aortic valve is calcified. There is moderate calcification of the aortic valve. There is moderate thickening of the aortic valve. Aortic valve regurgitation is not visualized. Moderate aortic valve stenosis. Aortic valve mean  gradient measures 33.0 mmHg. Aortic valve Vmax measures 3.80 m/s.  7. Aortic dilatation noted. There is mild dilatation of the ascending aorta, measuring 40 mm.  8. The inferior vena cava is normal in size with greater than 50% respiratory variability, suggesting right atrial pressure of 3 mmHg.    10/03/19 Cath - Mr. Dorsch has mild nonobstructive disease in his left system including the AV groove circumflex and LAD with a chronically occluded RCA, faint grade 1 left to right collaterals and a 30 mm peak to peak gradient across his aortic valve.  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  His LVEDP was 35.  At this point, I would recommend medical therapy.  '21 TTE - EF 35 to 40%. The left ventricle demonstrates global hypokinesis. Left ventricular diastolic parameters are indeterminate. Right ventricular systolic function is mildly reduced. Left and right atrial sizes were mildly dilated. Mild mitral valve regurgitation. Moderate aortic valve stenosis.     Neuro/Psych  Neuromuscular disease negative psych ROS   GI/Hepatic Neg liver ROS, GERD  Controlled,  Endo/Other  diabetes, Type 2, Oral Hypoglycemic Agents, Insulin Dependent  Renal/GU Renal disease     Musculoskeletal negative musculoskeletal ROS (+)   Abdominal   Peds  Hematology  (+) anemia ,   Anesthesia Other Findings Covid neg 6/1  Reproductive/Obstetrics                            Anesthesia Physical  Anesthesia Plan  ASA: IV  Anesthesia Plan: General   Post-op Pain Management:    Induction: Intravenous  PONV Risk Score and Plan: 2 and Treatment may vary due to age or medical condition, Ondansetron, Dexamethasone and Midazolam  Airway Management Planned: Oral ETT  Additional Equipment: Arterial line  Intra-op Plan:   Post-operative Plan: Extubation in OR  Informed Consent: I have reviewed the patients History and Physical, chart, labs and discussed the procedure including  the risks, benefits and alternatives for the proposed anesthesia with the patient or authorized representative who has indicated his/her understanding and acceptance.     Dental advisory given  Plan Discussed with: CRNA  Anesthesia Plan Comments:        Anesthesia Quick Evaluation

## 2020-08-07 NOTE — Anesthesia Procedure Notes (Signed)
Arterial Line Insertion Start/End4/13/2022 1:50 PM, 08/07/2020 1:55 PM Performed by: Babs Bertin, CRNA, CRNA  Preanesthetic checklist: patient identified, risks and benefits discussed, surgical consent and pre-op evaluation Left, radial was placed Catheter size: 20 G Hand hygiene performed  and maximum sterile barriers used   Attempts: 1 Procedure performed without using ultrasound guided technique. Ultrasound Notes:anatomy identified Following insertion, dressing applied and Biopatch.

## 2020-08-07 NOTE — Anesthesia Procedure Notes (Signed)
Procedure Name: Intubation Date/Time: 08/07/2020 2:20 PM Performed by: Babs Bertin, CRNA Pre-anesthesia Checklist: Patient identified, Emergency Drugs available, Suction available, Patient being monitored and Timeout performed Patient Re-evaluated:Patient Re-evaluated prior to induction Oxygen Delivery Method: Circle system utilized Preoxygenation: Pre-oxygenation with 100% oxygen Induction Type: IV induction Ventilation: Mask ventilation without difficulty Laryngoscope Size: Glidescope and 4 Grade View: Grade II Tube type: Oral Tube size: 7.5 mm Number of attempts: 1 Airway Equipment and Method: Stylet and Video-laryngoscopy Placement Confirmation: ETT inserted through vocal cords under direct vision,  breath sounds checked- equal and bilateral and CO2 detector Secured at: 22 cm Tube secured with: Tape Dental Injury: Teeth and Oropharynx as per pre-operative assessment

## 2020-08-07 NOTE — Progress Notes (Signed)
Patient noted to have incision to right lower leg and upper thigh along with groin site liquid adhesive in place no drainage to areas

## 2020-08-07 NOTE — Progress Notes (Signed)
Mobility Specialist: Progress Note   08/07/20 1203  Mobility  Activity Refused mobility   Pt states he is supposed to be taken for surgery soon. Will f/u later.   [facility] Rodney [name] Mobility Specialist Mobility Specialist Phone: [phone]

## 2020-08-07 NOTE — Progress Notes (Signed)
Checked on patient around 9pm.  He was getting breathing treatment at the time.  He complains of pain in the right leg above and below the knee that is not improved with oxycodone.  Pt has dilaudid 0.5-1mg  on his med list for severe pain.  Informed pt's nurse of his pain.  Incision site looks great. No sign of infection or bleeding.

## 2020-08-07 NOTE — Care Management Important Message (Signed)
Important Message  Patient Details  Name: Rodney [name] MRN: [id number] Date of Birth: 1950-12-29   Medicare Important Message Given:  Yes     Shelda Altes 08/07/2020, 10:11 AM

## 2020-08-07 NOTE — Progress Notes (Addendum)
Family Medicine Teaching Service Daily Progress Note Intern Pager: [phone]  Patient name: Rodney [name] Medical record number: [id number] Date of birth: 09/25/1950 Age: 70 y.o. Gender: male  Primary Care Provider: [name], MD Consultants: none   Code Status: Full Code  Pt Overview and Major Events to Date:  4/7 admitted 4/11 arteriogram with right common iliac stenting  Assessment and Plan: Rodney [name] is a 70 y.o. male who presents with right foot swelling and erythema concerning for cellulitis and PAD. 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  Right foot cellulitis Doing well on oral antibiotics - doxycycline (4/11-) - cephalexin (4/11-) - s/p IV vancomycin (4/7-11) - s/p IV cefepime (4/7-11) - elevate foot - wound care recommendations: Cut an Aquacel dressing Kellie Simmering [phone]) into a long strip. Weave between toes on right foot. Secure with a few turns of kerlex. Change daily.  PAD S/p stenting of the right common iliac. Planning for right femoral to AT or PT bypass today.  On ASA and statin. - right femoral to AT or PT bypass today 4/13 - vascular surgery consulted, appreciate recommendations  HTN Hypertensive 175/77 this AM but normotensive prior to this AM. Monitor for now. - losartan 50 mg - home carvedilol 12.5 mg BID  T2DM Adequately controlled on current regimen. - 70/30 insulin 20 units AM, 10 units PM - hold metformin - monitor CBG  HFpEF Stable. - furosemide 40 mg daily - strict I/O, daily weights  Persistent A Fib In NSR. Warfarin being held for procedure. Possible need for heparin bridge prior to restarting after procedure. - warfarin per pharmacy - monitor INR - amiodarone 200 mg daily  CKD IIIa Stable. - avoid nephrotoxic medications - monitor BMP  CAD On secondary prevention. - continue  atorvastatin 80 mg daily - continue ASA 81 mg daily  FEN/GI: NPO PPx: none per vascular  Disposition: med-tele  Subjective:  NAOE.  Feels good overall, no concerns at this time. Swelling improved.  Objective: Temp:  [97.9 F (36.6 C)-98.4 F (36.9 C)] 98.2 F (36.8 C) (04/13 0335) Pulse Rate:  [58-69] 63 (04/13 0457) Resp:  [18-23] 20 (04/13 0457) BP: (114-137)/(58-73) 133/58 (04/13 0335) SpO2:  [92 %-95 %] 95 % (04/13 0457) Weight:  [104.2 kg] 104.2 kg (04/13 0457) Physical Exam: General: Obese male laying in bed comfortably, NAD Cardiovascular: RRR, 3/6 systolic murmur best heard at the LUSB Respiratory: CTAB, breathing comfortably on room air Abdomen: Soft, nontender, +BS Extremities: WWP, 1+ pitting edema to right lower extremity up to mid ankle, s/p left BKA   Laboratory: Recent Labs  Lab 08/03/20 0131 08/06/20 0316 08/07/20 0322  WBC 9.9 7.7 7.4  HGB 10.7* 10.6* 9.2*  HCT 33.2* 32.9* 28.1*  PLT 262 303 246   Recent Labs  Lab 08/01/20 1633 08/02/20 0259 08/03/20 0131 08/05/20 0216 08/06/20 0316 08/07/20 0322  NA 134* 137   < > 133* 137 134*  K 4.2 5.0   < > 4.1 4.2 4.0  CL 102 104   < > 103 104 102  CO2 27 27   < > 26 27 27   BUN 23 22   < > 22 18 21   CREATININE 1.32* 1.30*   < > 1.33* 1.24 1.22  CALCIUM 8.6* 8.9   < > 8.2* 8.7* 8.5*  PROT 5.7* 6.0*  --   --   --   --   BILITOT  0.3 0.5  --   --   --   --   ALKPHOS 56 64  --   --   --   --   ALT 14 14  --   --   --   --   AST 15 13*  --   --   --   --   GLUCOSE 249* 121*   < > 137* 161* 135*   < > = values in this interval not displayed.     Imaging/Diagnostic Tests: No new imaging.   Zola Button, MD 08/07/2020, 7:33 AM PGY-1, [place] Intern pager: [phone], text pages welcome

## 2020-08-08 ENCOUNTER — Encounter (HOSPITAL_COMMUNITY): Payer: Self-pay | Admitting: Vascular Surgery

## 2020-08-08 DIAGNOSIS — I4819 Other persistent atrial fibrillation: Secondary | ICD-10-CM | POA: Diagnosis not present

## 2020-08-08 DIAGNOSIS — L03119 Cellulitis of unspecified part of limb: Secondary | ICD-10-CM | POA: Diagnosis not present

## 2020-08-08 DIAGNOSIS — I96 Gangrene, not elsewhere classified: Secondary | ICD-10-CM | POA: Diagnosis not present

## 2020-08-08 LAB — BASIC METABOLIC PANEL
Anion gap: 8 (ref 5–15)
BUN: 24 mg/dL — ABNORMAL HIGH (ref 8–23)
CO2: 25 mmol/L (ref 22–32)
Calcium: 8 mg/dL — ABNORMAL LOW (ref 8.9–10.3)
Chloride: 101 mmol/L (ref 98–111)
Creatinine, Ser: 1.58 mg/dL — ABNORMAL HIGH (ref 0.61–1.24)
GFR, Estimated: 47 mL/min — ABNORMAL LOW (ref >60)
Glucose, Bld: 127 mg/dL — ABNORMAL HIGH (ref 70–99)
Potassium: 4.8 mmol/L (ref 3.5–5.1)
Sodium: 134 mmol/L — ABNORMAL LOW (ref 135–145)

## 2020-08-08 LAB — GLUCOSE, CAPILLARY
Glucose-Capillary: 132 mg/dL — ABNORMAL HIGH (ref 70–99)
Glucose-Capillary: 208 mg/dL — ABNORMAL HIGH (ref 70–99)
Glucose-Capillary: 147 mg/dL — ABNORMAL HIGH (ref 70–99)
Glucose-Capillary: 153 mg/dL — ABNORMAL HIGH (ref 70–99)

## 2020-08-08 LAB — LIPID PANEL
Cholesterol: 122 mg/dL (ref 0–200)
HDL: 28 mg/dL — ABNORMAL LOW (ref >40)
LDL Cholesterol: 74 mg/dL (ref 0–99)
Total CHOL/HDL Ratio: 4.4 RATIO
Triglycerides: 98 mg/dL (ref <150)
VLDL: 20 mg/dL (ref 0–40)

## 2020-08-08 LAB — CBC
HCT: 27.8 % — ABNORMAL LOW (ref 39.0–52.0)
Hemoglobin: 8.9 g/dL — ABNORMAL LOW (ref 13.0–17.0)
MCH: 28.2 pg (ref 26.0–34.0)
MCHC: 32 g/dL (ref 30.0–36.0)
MCV: 88 fL (ref 80.0–100.0)
Platelets: 270 10*3/uL (ref 150–400)
RBC: 3.16 MIL/uL — ABNORMAL LOW (ref 4.22–5.81)
RDW: 12.6 % (ref 11.5–15.5)
WBC: 8.9 10*3/uL (ref 4.0–10.5)
nRBC: 0 % (ref 0.0–0.2)

## 2020-08-08 MED ORDER — ENOXAPARIN SODIUM 100 MG/ML ~~LOC~~ SOLN
100.0000 mg | Freq: Two times a day (BID) | SUBCUTANEOUS | Status: DC
  Administered 2020-08-08 – 2020-08-09 (×2): 100 mg via SUBCUTANEOUS
  Filled 2020-08-08 (×2): qty 1

## 2020-08-08 MED ORDER — WARFARIN - PHARMACIST DOSING INPATIENT: Freq: Every day | Status: DC

## 2020-08-08 MED ORDER — WARFARIN SODIUM 7.5 MG PO TABS
7.5000 mg | ORAL_TABLET | Freq: Once | ORAL | Status: AC
  Administered 2020-08-08: 7.5 mg via ORAL
  Filled 2020-08-08: qty 1

## 2020-08-08 NOTE — Progress Notes (Addendum)
ANTICOAGULATION CONSULT NOTE - Follow-up Consult  Pharmacy Consult for warfarin dosing Indication: atrial fibrillation  No Known Allergies  Patient Measurements: Height: 6' (182.9 cm) Weight: 108.5 kg (239 lb 3.2 oz) IBW/kg (Calculated) : 77.6  Vital Signs: Temp: 98.8 F (37.1 C) (04/14 1101) Temp Source: Oral (04/14 1101) BP: 104/66 (04/14 1101) Pulse Rate: 66 (04/14 1101)  Labs: Recent Labs    08/07/20 0322 08/07/20 1512 08/07/20 1628 08/07/20 1854 08/08/20 0000  HGB 9.2*   < > 11.9* 9.1* 8.9*  HCT 28.1*   < > 35.0* 28.1* 27.8*  PLT 246  --   --  274 270  CREATININE 1.22   < > 1.20 1.45* 1.58*   < > = values in this interval not displayed.    Estimated Creatinine Clearance: 57 mL/min (A) (by C-G formula based on SCr of 1.58 mg/dL (H)).   Medical History: Past Medical History:  Diagnosis Date  . CHF (congestive heart failure) ([place])   . Coronary artery disease   . Diabetes mellitus without complication ([place])   . HLD (hyperlipidemia)   . Hypertension   . Peripheral edema 02/24/2018  . SOB (shortness of breath) 07/26/2018    Assessment: 70 yo M on warfarin PTA for afib. PTA dose 5mg  daily except 7.5mg  MWF. Pt was admitted with worsening cellulitis / toe wound and started on empiric antibiotics. Warfarin held by [facility], last dose 4/9 PM. Patient s/p R common femoral to below-knee popliteal bypass 4/13. No further procedures planned, MD has requested re-initiation of warfarin.   Patient has been off of warfarin x4 days, last INR 1.5 on 4/11 following warfarin hold and vitamin K 5 mg on 4/10 prior to arteriogram. CHADsVASc=3. Considering length of hold and subtherapeutic INR, MD has requested bridging with enoxaparin. Pt initiated on SQ heparin 5,000 units Q8H on 4/14, will stop this and start treatment dose enoxaparin. Hgb 8.9, stable. Plts 270, stable.   Goal of Therapy:  INR 2-3 Monitor platelets by anticoagulation protocol: Yes   Plan:  - Administer warfarin 7.5  mg today  - Discontinue SQ heparin  - Initiate enoxaparin 100 mg Q12H (~1 mg/kg) until INR therapeutic >24 hours  - Monitor daily INR  - Monitor CBC & signs/symptoms of bleeding  Benna Dunks 08/08/2020,12:53 PM

## 2020-08-08 NOTE — Progress Notes (Signed)
Spoke with Dr. Donzetta Matters with vascular surgery.  Okay to restart warfarin and agreed with 10-day course of antibiotics for cellulitis.

## 2020-08-08 NOTE — Evaluation (Signed)
Occupational Therapy Evaluation Patient Details Name: Rodney [name] MRN: [id number] DOB: 1951-04-13 Today's Date: 08/08/2020    History of Present Illness 70 y.o. male presenting with worsening erythema and swelling of right foot, concerning for cellulitis. 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 S/p right femoral to below-knee popliteal artery bypass on 08/07/20.   Clinical Impression   Pt admitted with the above diagnoses and presents with below problem list. Pt will benefit from continued acute OT to address the below listed deficits and maximize independence with basic ADLs prior to d/c home. At baseline, pt is able to transfer to w/c and [facility] with no physical assist (girlfriend does stabilize BSC); recently had been working with HHPT on ambulating with a cane. Pt currently needs min A with bed mobility, able to sit EOB at mod I level and complete lateral leans mod I. Pt declined any OOB activity or transfer until girlfriend brings his LLE prosthetic and "if it hurts I'm not going to move." Pt is very guarded with RLE 2/2 pain.      Follow Up Recommendations  Home health OT;Supervision - Intermittent (OOB activity/transfers)    Equipment Recommendations  None recommended by OT    Recommendations for Other Services PT consult     Precautions / Restrictions Precautions Precautions: Fall Required Braces or Orthoses:  (LLE prosthetic) Restrictions Weight Bearing Restrictions: No      Mobility Bed Mobility Overal bed mobility: Needs Assistance Bed Mobility: Supine to Sit     Supine to sit: Min assist;HOB elevated     General bed mobility comments: Pt utilizing therpist arm to pull on the power up trunk and advance hips to full EOB position. Very guarded with RLE 2/2 pain and fear of increased pain.     Transfers                 General transfer comment: pt declined at this time 2/2 pain and wants to have LLE on before attempting any type of transfer as not to increase demand and therefore pain in RLE.    Balance Overall balance assessment: Needs assistance Sitting-balance support: No upper extremity supported (RLE support) Sitting balance-Leahy Scale: Good Sitting balance - Comments: sitting EOB awaiting breakfast try at end of session                                   ADL either performed or assessed with clinical judgement   ADL Overall ADL's : Needs assistance/impaired Eating/Feeding: Set up;Sitting   Grooming: Set up;Sitting   Upper Body Bathing: Set up;Sitting   Lower Body Bathing: Min guard;Sitting/lateral leans   Upper Body Dressing : Set up;Sitting   Lower Body Dressing: Min guard;Sitting/lateral leans                 General ADL Comments: Pt completed supine to EOB using therapist arm to power up trunk and scoot hips to full EOB position. Sat EOB several minutes mod I level. Declined OOB activity until LLE prosthetic arrives and RLE pain decreases. "If it hurts I'm not going to move."     Vision         Perception     Praxis      Pertinent Vitals/Pain Pain Assessment: Faces Faces Pain Scale: Hurts little more Pain Location: RLE at  rest Pain Descriptors / Indicators: Constant;Guarding;Grimacing Pain Intervention(s): Monitored during session;Repositioned;Limited activity within patient's tolerance     Hand Dominance     Extremity/Trunk Assessment Upper Extremity Assessment Upper Extremity Assessment: Generalized weakness   Lower Extremity Assessment Lower Extremity Assessment: Defer to PT evaluation   Cervical / Trunk Assessment Cervical / Trunk Assessment: Normal   Communication Communication Communication: HOH   Cognition Arousal/Alertness: Awake/alert Behavior During Therapy: WFL for tasks  assessed/performed Overall Cognitive Status: Within Functional Limits for tasks assessed                                     General Comments       Exercises     Shoulder Instructions      Home Living Family/patient expects to be discharged to:: Private residence Living Arrangements: Spouse/significant other (girlfriend) Available Help at Discharge: Friend(s) Type of Home: Mobile home Home Access: Ramped entrance     Home Layout: One level     Bathroom Shower/Tub: Teacher, early years/pre: Standard Bathroom Accessibility: Yes   Home Equipment: Environmental consultant - 2 wheels;Bedside commode;Tub bench;Wheelchair - manual   Additional Comments: Pt confirms still accurate from December admission: Pt typically sleeps and functions in living room (uses [facility]) and transfers to [facility] and back      Prior Functioning/Environment Level of Independence: Independent with assistive device(s)        Comments: Pt confirms still accurate from December admission: Pt typically transfers independently to [facility] and [place] (his fiance has to hold the [facility] because it will rock - but Pt does not need help physically to transfer. He does not drive anymore (girlfriend DOES help with vehicle transfers) but patient bathes and dresses on his own and is participating in PT working on ambulation with prosthetic        OT Problem List: Decreased strength;Decreased activity tolerance;Impaired balance (sitting and/or standing);Decreased knowledge of use of DME or AE;Decreased knowledge of precautions;Pain      OT Treatment/Interventions: Self-care/ADL training;Energy conservation;DME and/or AE instruction;Therapeutic activities;Patient/family education;Balance training    OT Goals(Current goals can be found in the care plan section) Acute Rehab OT Goals Patient Stated Goal: reduce pain, return home OT Goal Formulation: With patient Time For Goal Achievement: 08/22/20 Potential to Achieve Goals:  Good ADL Goals Pt Will Perform Lower Body Bathing: with modified independence;sit to/from stand;sitting/lateral leans Pt Will Perform Lower Body Dressing: with modified independence;sitting/lateral leans;sit to/from stand Pt Will Transfer to Toilet: with modified independence;stand pivot transfer;squat pivot transfer;bedside commode Pt Will Perform Toileting - Clothing Manipulation and hygiene: with modified independence;sitting/lateral leans;sit to/from stand Additional ADL Goal #1: Pt will complete bed mobility at mod I level to prepare for EOB/OOB ADLs.  OT Frequency: Min 2X/week   Barriers to D/C:            Co-evaluation              AM-PAC OT "6 Clicks" Daily Activity     Outcome Measure Help from another person eating meals?: None Help from another person taking care of personal grooming?: None Help from another person toileting, which includes using toliet, bedpan, or urinal?: A Little Help from another person bathing (including washing, rinsing, drying)?: A Little Help from another person to put on and taking off regular upper body clothing?: None Help from another person to put on and taking off regular lower body clothing?: A Little 6 Click  Score: 21   End of Session    Activity Tolerance: Patient limited by pain Patient left: in bed;with call bell/phone within reach;Other (comment) (EOB awaiting breakfast tray)  OT Visit Diagnosis: Other abnormalities of gait and mobility (R26.89);Muscle weakness (generalized) (M62.81);Pain Pain - Right/Left: Right Pain - part of body: Leg                Time: 1657-9038 OT Time Calculation (min): 21 min Charges:  OT General Charges $OT Visit: 1 Visit OT Evaluation $OT Eval Low Complexity: [place], OT Acute Rehabilitation Services Pager: [phone] Office: [phone]   Hortencia Pilar 08/08/2020, 9:27 AM

## 2020-08-08 NOTE — Progress Notes (Signed)
Mobility Specialist - Progress Note   08/08/20 1644  Mobility  Activity Refused mobility   Pt refused as he is in too much pain from PT earlier.   Pricilla Handler Mobility Specialist Mobility Specialist Phone: [phone]

## 2020-08-08 NOTE — Progress Notes (Signed)
Family Medicine Teaching Service Daily Progress Note Intern Pager: [phone]  Patient name: Rodney [name] Medical record number: [id number] Date of birth: July 01, 1950 Age: 70 y.o. Gender: male  Primary Care Provider: [name], MD Consultants: none   Code Status: Full Code  Pt Overview and Major Events to Date:  4/7 admitted 4/11 arteriogram with right common iliac stenting 4/13 right superficial femoral artery to below-knee popliteal artery bypass  Assessment and Plan: TORRIE LAFAVOR is a 70 y.o. male who presents with right foot swelling and erythema concerning for cellulitis and PAD. 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  Right foot cellulitis Doing well on oral antibiotics. Will touch base with vascular surgery regarding treatment length. - doxycycline (4/11-) - cephalexin (4/11-) - s/p IV vancomycin (4/7-11) - s/p IV cefepime (4/7-11) - elevate foot - wound care recommendations: Cut an Aquacel dressing Kellie Simmering [phone]) into a long strip. Weave between toes on right foot. Secure with a few turns of kerlex. Change daily.  PAD S/p stenting of the right common iliac. Doing well after RLE bypass surgery yesterday. Pain well-controlled and incisions clean. On ASA and statin. - APAP prn - oxycodone-APAP 5-325 mg q4h prn - vascular surgery consulted, appreciate recommendations  HTN Adequately controlled, SBP 110s-140s. - losartan 50 mg - home carvedilol 12.5 mg BID  T2DM Adequately controlled on current regimen. - 70/30 insulin 20 units AM, 10 units PM - hold metformin - monitor CBG  HFpEF Stable. - furosemide 40 mg daily - strict I/O, daily weights  Persistent A Fib In NSR. Warfarin was held for procedure. Possible need for heparin bridge prior to restarting after procedure, will reach out to vascular surgery. - monitor INR -  amiodarone 200 mg daily  CKD IIIa Stable. - avoid nephrotoxic medications - monitor BMP  CAD On secondary prevention. - continue atorvastatin 80 mg daily - continue ASA 81 mg daily  FEN/GI: NPO PPx: none per vascular, will reach out to vascular about restarting warfarin  Disposition: med-tele  Subjective:  NAOE.  Feels good overall. Incisional pain rated 5/10, manageable. No concerns.  Objective: Temp:  [97 F (36.1 C)-98.7 F (37.1 C)] 98.7 F (37.1 C) (04/14 0726) Pulse Rate:  [60-80] 80 (04/14 0726) Resp:  [14-19] 18 (04/14 0726) BP: (115-175)/(61-77) 147/67 (04/14 0726) SpO2:  [94 %-100 %] 94 % (04/14 0726) Arterial Line BP: (167-173)/(59-62) 173/60 (04/13 1815) Weight:  [108.5 kg] 108.5 kg (04/14 0500) Physical Exam: General: Obese male laying in bed comfortably, NAD Cardiovascular: RRR, 3/6 systolic murmur best heard at the LUSB Respiratory: CTAB, breathing comfortably on room air Abdomen: Soft, nontender, +BS Extremities: WWP, 1+ pitting edema to right lower extremity up to mid ankle, s/p left BKA  Laboratory: Recent Labs  Lab 08/07/20 0322 08/07/20 1512 08/07/20 1628 08/07/20 1854 08/08/20 0000  WBC 7.4  --   --  12.2* 8.9  HGB 9.2*   < > 11.9* 9.1* 8.9*  HCT 28.1*   < > 35.0* 28.1* 27.8*  PLT 246  --   --  274 270   < > = values in this interval not displayed.   Recent Labs  Lab 08/01/20 1633 08/02/20 0259 08/03/20 0131 08/06/20 0316 08/07/20 0322 08/07/20 1512 08/07/20 1623 08/07/20 1628 08/07/20 1854 08/08/20 0000  NA 134* 137   < > 137 134* 137 135 136  --  134*  K 4.2 5.0   < >  4.2 4.0 3.6 3.9 3.9  --  4.8  CL 102 104   < > 104 102 100  --  100  --  101  CO2 27 27   < > 27 27  --   --   --   --  25  BUN 23 22   < > 18 21 20   --  20  --  24*  CREATININE 1.32* 1.30*   < > 1.24 1.22 1.10  --  1.20 1.45* 1.58*  CALCIUM 8.6* 8.9   < > 8.7* 8.5*  --   --   --   --  8.0*  PROT 5.7* 6.0*  --   --   --   --   --   --   --   --   BILITOT 0.3  0.5  --   --   --   --   --   --   --   --   ALKPHOS 56 64  --   --   --   --   --   --   --   --   ALT 14 14  --   --   --   --   --   --   --   --   AST 15 13*  --   --   --   --   --   --   --   --   GLUCOSE 249* 121*   < > 161* 135* 84  --  88  --  127*   < > = values in this interval not displayed.     Imaging/Diagnostic Tests: No new imaging.   Zola Button, MD 08/08/2020, 7:35 AM PGY-1, [place] Intern pager: [phone], text pages welcome

## 2020-08-08 NOTE — Evaluation (Signed)
Physical Therapy Evaluation Patient Details Name: Rodney [name] MRN: [id number] DOB: 30-Sep-1950 Today's Date: 08/08/2020   History of Present Illness  70 y.o. male presenting with worsening erythema and swelling of right foot, concerning for cellulitis. 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 S/p right femoral to below-knee popliteal artery bypass on 08/07/20.  Clinical Impression  Pt admitted with/for fem/pop BPG on the R LE.  Due to intense pain R LE and a prosthesis on the L LE, pt needing min to max assist for basic mobility and transfers..  Pt currently limited functionally due to the problems listed below.  (see problems list.)  Pt will benefit from PT to maximize function and safety to be able to get home safely with available assist.     Follow Up Recommendations Home health PT;Other (comment);Supervision/Assistance - 24 hour    Equipment Recommendations  None recommended by PT    Recommendations for Other Services       Precautions / Restrictions Precautions Precautions: Fall Required Braces or Orthoses:  (LLE prosthetic)      Mobility  Bed Mobility Overal bed mobility: Needs Assistance Bed Mobility: Sit to Supine       Sit to supine: Min assist   General bed mobility comments: pt up EOB on arrival--HOB raised significantly.  pt needed assist at legs to get them back into bed.    Transfers Overall transfer level: Needs assistance   Transfers: Sit to/from Stand Sit to Stand: Max assist;From elevated surface         General transfer comment: cues for best Hand placement.  Significant assist to get pt upright onto L prosthesis and R painful LE.  Pt refused to transfer or do anything else due to significant pain.  Ambulation/Gait             General Gait Details: NT  Stairs             Wheelchair Mobility    Modified Rankin (Stroke Patients Only)       Balance Overall balance assessment: Needs assistance Sitting-balance support: No upper extremity supported (RLE support) Sitting balance-Leahy Scale: Good Sitting balance - Comments: pt was too uncomfortable sitting EOB to donn prosthesis on the L LE and asked for help.     Standing balance-Leahy Scale: Poor Standing balance comment: posterior bias.                             Pertinent Vitals/Pain Pain Assessment: Faces Faces Pain Scale: Hurts whole lot Pain Location: R LE in standing. Pain Descriptors / Indicators: Constant;Guarding;Grimacing Pain Intervention(s): Premedicated before session;Monitored during session;Limited activity within patient's tolerance    Home Living Family/patient expects to be discharged to:: Private residence Living Arrangements: Spouse/significant other (girlfriend) Available Help at Discharge: Friend(s) Type of Home: Mobile home Home Access: Ramped entrance     Home Layout: One level Home Equipment: Environmental consultant - 2 wheels;Bedside commode;Tub bench;Wheelchair - manual Additional Comments: Pt confirms still accurate from December admission: Pt typically sleeps and functions in living room (uses [facility]) and transfers to [facility] and back    Prior Function Level of Independence: Independent with assistive device(s)         Comments: Pt confirms still accurate from December admission: Pt typically transfers independently to [facility] and [place] (his fiance has to hold the [facility] because  it will rock - but Pt does not need help physically to transfer. He does not drive anymore (girlfriend DOES help with vehicle transfers) but patient bathes and dresses on his own and is participating in PT working on ambulation with prosthetic     Hand Dominance        Extremity/Trunk Assessment   Upper Extremity Assessment Upper Extremity Assessment: Generalized weakness    Lower Extremity  Assessment Lower Extremity Assessment: Generalized weakness    Cervical / Trunk Assessment Cervical / Trunk Assessment: Normal  Communication   Communication: HOH  Cognition Arousal/Alertness: Awake/alert Behavior During Therapy: WFL for tasks assessed/performed Overall Cognitive Status: Within Functional Limits for tasks assessed                                        General Comments      Exercises     Assessment/Plan    PT Assessment Patient needs continued PT services  PT Problem List Decreased strength;Decreased activity tolerance;Decreased mobility;Decreased balance;Decreased knowledge of use of DME;Pain       PT Treatment Interventions DME instruction;Functional mobility training;Therapeutic activities;Therapeutic exercise;Balance training;Patient/family education    PT Goals (Current goals can be found in the Care Plan section)  Acute Rehab PT Goals Patient Stated Goal: reduce pain, return home PT Goal Formulation: With patient Time For Goal Achievement: 08/15/20 Potential to Achieve Goals: Good    Frequency Min 3X/week   Barriers to discharge        Co-evaluation               AM-PAC PT "6 Clicks" Mobility  Outcome Measure Help needed turning from your back to your side while in a flat bed without using bedrails?: A Little Help needed moving from lying on your back to sitting on the side of a flat bed without using bedrails?: A Little Help needed moving to and from a bed to a chair (including a wheelchair)?: A Lot Help needed standing up from a chair using your arms (e.g., wheelchair or bedside chair)?: A Lot Help needed to walk in hospital room?: Total Help needed climbing 3-5 steps with a railing? : Total 6 Click Score: 12    End of Session   Activity Tolerance: Patient limited by pain;Patient tolerated treatment well Patient left: in bed;with call bell/phone within reach;with bed alarm set;with family/visitor present Nurse  Communication: Mobility status PT Visit Diagnosis: Other abnormalities of gait and mobility (R26.89);Pain;Muscle weakness (generalized) (M62.81) Pain - Right/Left: Right Pain - part of body: Leg    Time: 4193-7902 PT Time Calculation (min) (ACUTE ONLY): 28 min   Charges:   PT Evaluation $PT Eval Moderate Complexity: 1 Mod PT Treatments $Therapeutic Activity: 8-22 mins        08/08/2020  Ginger Carne., PT Acute Rehabilitation Services [phone]  (pager) [phone]  (office)  Tessie Fass [place] 08/08/2020, 5:12 PM

## 2020-08-08 NOTE — TOC Progression Note (Signed)
Transition of Care (TOC) - Progression Note  [name] RN, BSN Transitions of Care Unit 4E- RN Case Manager See Treatment Team for direct phone #    Patient Details  Name: Rodney [name] MRN: [id number] Date of Birth: 1950-09-26  Transition of Care [facility]) CM/SW Contact  Dahlia Client Romeo Rabon, RN Phone Number: 08/08/2020, 4:27 PM  Clinical Narrative:    Follow up done with pt s/p fempop, per pt he now is requesting HHPT follow up- states he has used [facility] [place] for [facility] services in past and wants to use them again. Reports he has already spoken with the [facility] therapist Elta Guadeloupe. Pt will need orders for HHPT, pt declines needing HHRN for wound care states he and his GF can take care of wound care.  Per pt he has all needed DME at home- no new needs at this time for DME.   Call made to [facility] with [facility]) for [facility] referral- referral accepted pending orders- (order needed for HHPT)- per [facility] they can always add HHRN later in the home if needed.      Expected Discharge Plan: [place] Barriers to Discharge: Continued Medical Work up  Expected Discharge Plan and Services Expected Discharge Plan: [place]   Discharge Planning Services: CM Consult Post Acute Care Choice: [place] arrangements for the past 2 months: Single Family Home                 DME Arranged: N/A DME Agency: NA       HH Arranged: PT HH Agency: [facility] [employer] Date [place]: 08/08/20 Time [place]: 1627 Representative spoke with at [place]: [place]) Interventions    Readmission Risk Interventions No flowsheet data found.

## 2020-08-08 NOTE — Plan of Care (Signed)
  Problem: Clinical Measurements: Goal: Will remain free from infection Outcome: Progressing Goal: Respiratory complications will improve Outcome: Progressing Goal: Cardiovascular complication will be avoided Outcome: Progressing   

## 2020-08-08 NOTE — Progress Notes (Addendum)
[facility] [place]  Subjective  - No major complaints, his right leg at the incisions and the foot is tender.   Objective 115/62 72 97.9 F (36.6 C) (Oral) 18 100%  Intake/Output Summary (Last 24 hours) at 08/08/2020 0725 Last data filed at 08/08/2020 1975 Gross per 24 hour  Intake 2500 ml  Output 1150 ml  Net 1350 ml   Right groin soft without hematoma Leg incisions are healing well with soft compartment Doppler signals Dp/PT brisk Lungs non labored breathing   Assessment/Planning: POD # 1  Procedure Performed: 1.  Harvest right greater saphenous vein 2.  Right superficial femoral artery to below-knee popliteal artery bypass with ipsilateral, translocated, nonreversed greater saphenous vein  Patent bypass with brisk doppler signals He has not voided yet or worked with PT/OT HGB surgical blood loss anemia stable asymptomatic 8.9 Cr elevated 1.58, foley urine Op  Pending discharge   Rodney [name] 08/08/2020 7:25 AM --  Laboratory Lab Results: Recent Labs    08/07/20 1854 08/08/20 0000  WBC 12.2* 8.9  HGB 9.1* 8.9*  HCT 28.1* 27.8*  PLT 274 270   BMET Recent Labs    08/07/20 0322 08/07/20 1512 08/07/20 1628 08/07/20 1854 08/08/20 0000  NA 134*   < > 136  --  134*  K 4.0   < > 3.9  --  4.8  CL 102   < > 100  --  101  CO2 27  --   --   --  25  GLUCOSE 135*   < > 88  --  127*  BUN 21   < > 20  --  24*  CREATININE 1.22   < > 1.20 1.45* 1.58*  CALCIUM 8.5*  --   --   --  8.0*   < > = values in this interval not displayed.    COAG Lab Results  Component Value Date   INR 1.5 (H) 08/05/2020   INR 2.8 (H) 08/03/2020   INR 2.0 (H) 08/02/2020   No results found for: PTT  I have independently interviewed and examined patient and agree with PA assessment and plan above.  Okay to restart Coumadin.  Very brisk AT and PT signals.  His girlfriend is bringing his left lower extremity prosthetic and we will plan to get him on  his feet later today or tomorrow.  Will be okay for discharge when ambulating with his prosthetic and pain is well controlled.  Sabrena Gavitt C. Donzetta Matters, MD [facility] [place] Office: [phone] Pager: [phone]

## 2020-08-08 NOTE — Progress Notes (Signed)
PHARMACIST LIPID MONITORING   Rodney [name] is a 70 y.o. male admitted on 08/01/2020 with worsening RLE cellulitis. PMH significant for PAD, HTN, DM, moderate aortic stenosis, and Afib on warfarin. Pharmacy has been consulted to optimize lipid-lowering therapy with the indication of secondary prevention for clinical ASCVD.  Recent Labs:  Lipid Panel (last 6 months):   Lab Results  Component Value Date   CHOL 122 08/08/2020   TRIG 98 08/08/2020   HDL 28 (L) 08/08/2020   CHOLHDL 4.4 08/08/2020   VLDL 20 08/08/2020   LDLCALC 74 08/08/2020    Hepatic function panel (last 6 months):   Lab Results  Component Value Date   AST 13 (L) 08/02/2020   ALT 14 08/02/2020   ALKPHOS 64 08/02/2020   BILITOT 0.5 08/02/2020    SCr (since admission):   Serum creatinine: 1.58 mg/dL (H) 08/08/20 0000 Estimated creatinine clearance: 57 mL/min (A)  Current therapy and lipid therapy tolerance Current lipid-lowering therapy: atorvastatin 80 mg daily Previous lipid-lowering therapies (if applicable): simvastatin 20 mg daily, rosuvastatin 20 mg daily   Documented or reported allergies or intolerances to lipid-lowering therapies (if applicable): none, switched due to cost  Assessment:   Patient is close to LDL goal of <70 at 74 for secondary prevention of ASCVD. Adherence appears optimal, no adverse effects reported.   Plan:    1.Statin intensity (high intensity recommended for all patients regardless of the LDL):  No statin changes. The patient is already on a high intensity statin.  2.Add ezetimibe (if any one of the following):   Not indicated at this time  3.Refer to lipid clinic:   No  4.Follow-up with:  Primary care provider - Hensel, Jamal Collin, MD  5.Follow-up labs after discharge:  No changes in lipid therapy, repeat a lipid panel in one year.       Benna Dunks, PharmD 08/08/2020, 9:20 AM

## 2020-08-08 NOTE — Progress Notes (Signed)
Dressing to right foot/toes changed as ordered. Dressing applied.  Phala Schraeder, Bettina Gavia RN

## 2020-08-09 DIAGNOSIS — I5020 Unspecified systolic (congestive) heart failure: Secondary | ICD-10-CM

## 2020-08-09 DIAGNOSIS — E1149 Type 2 diabetes mellitus with other diabetic neurological complication: Secondary | ICD-10-CM | POA: Diagnosis not present

## 2020-08-09 DIAGNOSIS — I4819 Other persistent atrial fibrillation: Secondary | ICD-10-CM | POA: Diagnosis not present

## 2020-08-09 DIAGNOSIS — I10 Essential (primary) hypertension: Secondary | ICD-10-CM | POA: Diagnosis not present

## 2020-08-09 DIAGNOSIS — L03119 Cellulitis of unspecified part of limb: Secondary | ICD-10-CM | POA: Diagnosis not present

## 2020-08-09 LAB — BASIC METABOLIC PANEL
Anion gap: 5 (ref 5–15)
Anion gap: 6 (ref 5–15)
BUN: 38 mg/dL — ABNORMAL HIGH (ref 8–23)
BUN: 39 mg/dL — ABNORMAL HIGH (ref 8–23)
CO2: 27 mmol/L (ref 22–32)
CO2: 24 mmol/L (ref 22–32)
Calcium: 8.3 mg/dL — ABNORMAL LOW (ref 8.9–10.3)
Calcium: 8.2 mg/dL — ABNORMAL LOW (ref 8.9–10.3)
Chloride: 97 mmol/L — ABNORMAL LOW (ref 98–111)
Chloride: 98 mmol/L (ref 98–111)
Creatinine, Ser: 2.67 mg/dL — ABNORMAL HIGH (ref 0.61–1.24)
Creatinine, Ser: 2.45 mg/dL — ABNORMAL HIGH (ref 0.61–1.24)
GFR, Estimated: 25 mL/min — ABNORMAL LOW (ref >60)
GFR, Estimated: 28 mL/min — ABNORMAL LOW (ref >60)
Glucose, Bld: 138 mg/dL — ABNORMAL HIGH (ref 70–99)
Glucose, Bld: 132 mg/dL — ABNORMAL HIGH (ref 70–99)
Potassium: 3.8 mmol/L (ref 3.5–5.1)
Potassium: 3.9 mmol/L (ref 3.5–5.1)
Sodium: 129 mmol/L — ABNORMAL LOW (ref 135–145)
Sodium: 128 mmol/L — ABNORMAL LOW (ref 135–145)

## 2020-08-09 LAB — CBC
HCT: 22.5 % — ABNORMAL LOW (ref 39.0–52.0)
Hemoglobin: 7.3 g/dL — ABNORMAL LOW (ref 13.0–17.0)
MCH: 28.9 pg (ref 26.0–34.0)
MCHC: 32.4 g/dL (ref 30.0–36.0)
MCV: 88.9 fL (ref 80.0–100.0)
Platelets: 245 10*3/uL (ref 150–400)
RBC: 2.53 MIL/uL — ABNORMAL LOW (ref 4.22–5.81)
RDW: 12.9 % (ref 11.5–15.5)
WBC: 8.1 10*3/uL (ref 4.0–10.5)
nRBC: 0 % (ref 0.0–0.2)

## 2020-08-09 LAB — HEMOGLOBIN AND HEMATOCRIT, BLOOD
HCT: 22.4 % — ABNORMAL LOW (ref 39.0–52.0)
HCT: 23.8 % — ABNORMAL LOW (ref 39.0–52.0)
HCT: 22.9 % — ABNORMAL LOW (ref 39.0–52.0)
Hemoglobin: 7.1 g/dL — ABNORMAL LOW (ref 13.0–17.0)
Hemoglobin: 7.8 g/dL — ABNORMAL LOW (ref 13.0–17.0)
Hemoglobin: 7.4 g/dL — ABNORMAL LOW (ref 13.0–17.0)

## 2020-08-09 LAB — PREPARE RBC (CROSSMATCH)

## 2020-08-09 LAB — PROTIME-INR
INR: 1.4 — ABNORMAL HIGH (ref 0.8–1.2)
Prothrombin Time: 17.3 seconds — ABNORMAL HIGH (ref 11.4–15.2)

## 2020-08-09 LAB — GLUCOSE, CAPILLARY
Glucose-Capillary: 122 mg/dL — ABNORMAL HIGH (ref 70–99)
Glucose-Capillary: 128 mg/dL — ABNORMAL HIGH (ref 70–99)
Glucose-Capillary: 132 mg/dL — ABNORMAL HIGH (ref 70–99)
Glucose-Capillary: 120 mg/dL — ABNORMAL HIGH (ref 70–99)

## 2020-08-09 MED ORDER — CLOPIDOGREL BISULFATE 75 MG PO TABS
75.0000 mg | ORAL_TABLET | Freq: Every day | ORAL | Status: DC
  Administered 2020-08-09 – 2020-08-13 (×5): 75 mg via ORAL
  Filled 2020-08-09 (×5): qty 1

## 2020-08-09 MED ORDER — WARFARIN SODIUM 7.5 MG PO TABS
7.5000 mg | ORAL_TABLET | Freq: Once | ORAL | Status: AC
  Administered 2020-08-09: 7.5 mg via ORAL
  Filled 2020-08-09: qty 1

## 2020-08-09 MED ORDER — SODIUM CHLORIDE 0.9% IV SOLUTION: Freq: Once | INTRAVENOUS | Status: AC

## 2020-08-09 MED ORDER — CEPHALEXIN 500 MG PO CAPS
500.0000 mg | ORAL_CAPSULE | Freq: Three times a day (TID) | ORAL | Status: AC
  Administered 2020-08-09 – 2020-08-10 (×5): 500 mg via ORAL
  Filled 2020-08-09 (×5): qty 1

## 2020-08-09 MED ORDER — ENOXAPARIN SODIUM 100 MG/ML ~~LOC~~ SOLN: 100.0000 mg | SUBCUTANEOUS | Status: DC

## 2020-08-09 MED ORDER — PENTOXIFYLLINE ER 400 MG PO TBCR
400.0000 mg | EXTENDED_RELEASE_TABLET | Freq: Every day | ORAL | Status: DC
  Administered 2020-08-10 – 2020-08-13 (×4): 400 mg via ORAL
  Filled 2020-08-09 (×4): qty 1

## 2020-08-09 NOTE — Progress Notes (Signed)
Mobility Specialist - Progress Note   08/09/20 1056  Mobility  Activity Contraindicated/medical hold   Instructed by RN to hold mobility as pt is receiving blood transfusion, will f/u as able.   Pricilla Handler Mobility Specialist Mobility Specialist Phone: [phone]

## 2020-08-09 NOTE — Progress Notes (Addendum)
VASCULAR SURGERY ASSESSMENT & PLAN:   2 Days Post-Op Right superficial femoral artery to below-knee popliteal artery bypass with ipsilateral, translocated, nonreversed greater saphenous vein secondary to critical right lower extremity ischemia.  Start Plavix today (ordered). On aspirin and statin.  AKI: serum creatinine increased from baseline of 1.10 to 2.67 today. History of CKD IIIA. Voiding spontaneously. UOP charted 100cc  Acute BLA: Hgb drift to 7.3. Asymptomatic.   History of atrial fibrillation: Does not appear Coumadin has been restarted>ok from vascular standpoint   On Lovenox for DVT prophy.  SUBJECTIVE:   Significant post-op pain limiting mobility. Denies near-syncope when OOB. NO SOB or CP.  PHYSICAL EXAM:   Vitals:   08/08/20 1617 08/08/20 2040 08/08/20 2333 08/09/20 0257  BP: (!) 104/52 (!) 95/53 (!) 103/57 (!) 104/57  Pulse: 69 70 69 73  Resp: 19 17 18 18   Temp: 99 F (37.2 C) 98.8 F (37.1 C) 98.4 F (36.9 C) 98.4 F (36.9 C)  TempSrc: Oral Oral Oral Oral  SpO2: 94% 91% 96% 95%  Weight:    49.7 kg  Height:       General appearance: Awake, alert in no apparent distress Cardiac: Heart rate and rhythm are regular Respirations: Nonlabored Incisions: Right groin, thigh and lower leg incisions are all well approximated without bleeding or hematoma Extremities: Moderate RLE edema. Right foot is warm with intact sensation and motor function.  Brisk dorsalis pedis, posterior tibial and peroneal artery Doppler signals. Stable, non-infected skin breakdown plantar surface of right GT.  LABS:   Lab Results  Component Value Date   WBC 8.1 08/09/2020   HGB 7.3 (L) 08/09/2020   HCT 22.5 (L) 08/09/2020   MCV 88.9 08/09/2020   PLT 245 08/09/2020   Lab Results  Component Value Date   CREATININE 2.67 (H) 08/09/2020   Lab Results  Component Value Date   INR 1.4 (H) 08/09/2020   CBG (last 3)  Recent Labs    08/08/20 1619 08/08/20 2134 08/09/20 0626   GLUCAP 147* 153* 122*    PROBLEM LIST:    Principal Problem:   Cellulitis of foot, right Active Problems:   Essential hypertension   Tobacco abuse   DM (diabetes mellitus), type 2 with neurological complications (HCC)   PVD (peripheral vascular disease) (HCC)   HFrEF (heart failure with reduced ejection fraction) (HCC)   Atrial fibrillation (HCC)   S/P BKA (below knee amputation) unilateral, left (HCC)   Nail dystrophy   Infective myositis of right foot   Ascending aorta dilation (HCC)   Gangrene of toe of right foot (HCC)   CURRENT MEDS:   . amiodarone  200 mg Oral Daily  . aspirin EC  81 mg Oral Daily  . atorvastatin  80 mg Oral Daily  . carvedilol  12.5 mg Oral BID WC  . cephALEXin  500 mg Oral Q6H  . doxycycline  100 mg Oral Q12H  . enoxaparin (LOVENOX) injection  100 mg Subcutaneous BID  . furosemide  40 mg Oral Daily  . insulin aspart  0-9 Units Subcutaneous TID WC  . insulin aspart protamine- aspart  10 Units Subcutaneous Q supper  . insulin aspart protamine- aspart  20 Units Subcutaneous Q breakfast  . losartan  50 mg Oral QHS  . nicotine  14 mg Transdermal Daily  . nitroGLYCERIN  0.2 mg Transdermal Daily  . pantoprazole  40 mg Oral Daily  . pentoxifylline  400 mg Oral TID WC  . sodium chloride flush  3  mL Intravenous Q12H  . Warfarin - Pharmacist Dosing Inpatient   Does not apply [place], PA-C Office: [phone] 08/09/2020   I have independently interviewed and examined patient and agree with PA assessment and plan above.  Okay for Plavix and to restart Coumadin.  I have encouraged mobilization patient seems somewhat reluctant given swelling with pain.  Tayveon Lombardo C. Donzetta Matters, MD [facility] [place] Office: [phone] Pager: [phone]

## 2020-08-09 NOTE — Progress Notes (Signed)
PT Cancellation Note  Patient Details Name: Rodney [name] MRN: [id number] DOB: [birth year]   Cancelled Treatment:    Reason Eval/Treat Not Completed: (P) Pain limiting ability to participate (pt reporting severe RLE pain, refusing transfers or even bed-level session, RN notified. Pillow placed under RLE to float heel and reviewed supine exercises with him to perform later as tolerated.) Attempted ~12pm also however pt having blood transfusion. Will continue efforts next date as schedule permits.   Kara Pacer Pearley Millington 08/09/2020, 4:16 PM

## 2020-08-09 NOTE — Progress Notes (Signed)
PHARMACY NOTE:  ANTIMICROBIAL RENAL DOSAGE ADJUSTMENT  Current antimicrobial regimen includes a mismatch between antimicrobial dosage and estimated renal function.  As per policy approved by the Pharmacy & Therapeutics and Medical Executive Committees, the antimicrobial dosage will be adjusted accordingly.  Current antimicrobial dosage:  Cephalexin 500 mg PO every 6 hours   Indication: cellulitis  Renal Function:   Estimated Creatinine Clearance: 20.3 mL/min (A) (by C-G formula based on SCr of 2.45 mg/dL (H)). []      On intermittent HD, scheduled: []      On CRRT    Antimicrobial dosage has been changed to:  Cephalexin 500 mg PO every 8 hours  Additional comments: None  Thank you for allowing pharmacy to be a part of this patient's care.  Benna Dunks, [place] 08/09/2020 9:41 AM

## 2020-08-09 NOTE — Progress Notes (Addendum)
ANTICOAGULATION CONSULT NOTE - Follow-up Consult  Pharmacy Consult for warfarin dosing Indication: atrial fibrillation  No Known Allergies  Patient Measurements: Height: 6' (182.9 cm) Weight: 49.7 kg (109 lb 8 oz) IBW/kg (Calculated) : 77.6  Vital Signs: Temp: 98.4 F (36.9 C) (04/15 0257) Temp Source: Oral (04/15 0257) BP: 104/57 (04/15 0257) Pulse Rate: 73 (04/15 0257)  Labs: Recent Labs    08/07/20 1854 08/08/20 0000 08/09/20 0305 08/09/20 0732  HGB 9.1* 8.9* 7.3* 7.1*  HCT 28.1* 27.8* 22.5* 22.4*  PLT 274 270 245  --   LABPROT  --   --  17.3*  --   INR  --   --  1.4*  --   CREATININE 1.45* 1.58* 2.67* 2.45*    Estimated Creatinine Clearance: 20.3 mL/min (A) (by C-G formula based on SCr of 2.45 mg/dL (H)).   Medical History: Past Medical History:  Diagnosis Date  . CHF (congestive heart failure) ([place])   . Coronary artery disease   . Diabetes mellitus without complication ([place])   . HLD (hyperlipidemia)   . Hypertension   . Peripheral edema 02/24/2018  . SOB (shortness of breath) 07/26/2018    Assessment: 69 yo M on warfarin PTA for afib. PTA dose 5mg  daily except 7.5 mg MWF. Pt was admitted with worsening cellulitis / toe wound and started on empiric antibiotics. Warfarin held by [facility] x4 days. Patient s/p R common femoral to below-knee popliteal bypass 4/13. No further procedures planned, MD requested re-initiation of warfarin and bridging with enoxaparin. Warfarin and enoxaparin initiated yesterday.   INR today subtherapeutic at 1.4. Hgb dropped to 7.1 from 8.9. Plts 245, slight drop from 270 on 4/14. Infusion planned for today, no active bleeding observed. Of note, Scr increased from 1.58 on 4/14 to 2.45 today, enoxaparin dose adjustment needed. Additional medications adjusted accordingly.   Goal of Therapy:  INR 2-3 Monitor platelets by anticoagulation protocol: Yes   Plan:  - Administer warfarin 7.5 mg today   - Decrease frequency of enoxaparin to 100  mg SQ every 24 hours, continue until INR therapeutic >24 hours  - Monitor daily INR  - Monitor CBC & signs/symptoms of bleeding  -Monitor renal function for additional dose adjustments  Benna Dunks 08/09/2020,9:42 AM

## 2020-08-09 NOTE — Progress Notes (Addendum)
Family Medicine Teaching Service Daily Progress Note Intern Pager: [phone]  Patient name: Rodney [name] Medical record number: [id number] Date of birth: [birth year] Age: 70 y.o. Gender: male  Primary Care Provider: [name], MD Consultants: none   Code Status: Full Code  Pt Overview and Major Events to Date:  4/7 admitted 4/11 arteriogram with right common iliac stenting 4/13 right superficial femoral artery to below-knee popliteal artery bypass   Assessment and Plan: Rodney [name] is a 70 y.o. male who presents with right foot swelling and erythema concerning for cellulitis and PAD. 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  Right foot cellulitis Doing well on oral antibiotics. Plan for 10-day total course of antibiotics. Antibiotics renally dosed now given AKI. - doxycycline (4/11-) - cephalexin (4/11-) - s/p IV vancomycin (4/7-11) - s/p IV cefepime (4/7-11) - elevate foot - wound care recommendations: Cut an Aquacel dressing Kellie Simmering [phone]) into a long strip. Weave between toes on right foot. Secure with a few turns of kerlex. Change daily.  PAD S/p stenting of the right common iliac. Doing well after RLE bypass surgery 4/13. Pain well-controlled and incisions clean. On ASA and statin. - APAP prn - oxycodone-APAP 5-325 mg q4h prn - pentoxifylline  - vascular surgery consulted, appreciate recommendations  Acute blood loss anemia Hemoglobin decreased from 8.9 to 7.1.  Likely from bypass surgery 2 days ago, currently postop day 2.  Also warfarin was restarted yesterday with LMWH.  Transfusion threshold of 8 given history of PAD, patient gave verbal consent for blood transfusion so we will initiate 1 unit of packed red blood cells. Type and screen UTD from the OR. - 1u pRBC - f/u post-transfusion H/H  HTN Blood pressures have been soft  78I to 696E systolic. Likely from blood loss. - hold losartan 50 mg - home carvedilol 12.5 mg BID  T2DM Adequately controlled on current regimen. - 70/30 insulin 20 units AM, 10 units PM - hold metformin - monitor CBG  HFpEF Lower extremity edema worsening, likely confounded by postoperative swelling. - hold furosemide 40 mg daily given AKI - strict I/O, daily weights  Persistent A Fib In NSR. Warfarin restarted with LMWH to bridge. - warfarin per pharmacy - monitor INR - amiodarone 200 mg daily  AKI superimposed on CKD IIIa Creatinine has increased today to 2.45 from 1.58 yesterday, suspect likely prerenal from acute blood loss.  Holding off on IV fluids given history of HFpEF. - avoid nephrotoxic medications - holding ARB, furosemide - renally adjusting medications - monitor BMP  CAD On secondary prevention. - continue atorvastatin 80 mg daily - continue ASA 81 mg daily  FEN/GI: carb-modified diet PPx: warfarin bridging with LMWH  Disposition: med-tele  Subjective:  NAOE.  Patient reports pain in his right lower extremity only when he moves the leg.  Denies any bleeding currently.  Feels good overall, no concerns.  He stood up yesterday but did not ambulate.  Plan to ambulate today.  Discussed need for blood transfusion, patient in agreement and expressed consent.  Objective: Temp:  [98.4 F (36.9 C)-99 F (37.2 C)] 98.4 F (36.9 C) (04/15 0257) Pulse Rate:  [66-73] 73 (04/15 0257) Resp:  [17-19] 18 (04/15 0257) BP: (95-104)/(52-66) 104/57 (04/15 0257) SpO2:  [90 %-96 %] 95 % (04/15 0257) Weight:  [49.7 kg] 49.7 kg (04/15 0257) Physical Exam: General: Obese male laying in bed comfortably, NAD Cardiovascular: RRR,  3/6 systolic murmur best heard at the LUSB Respiratory: CTAB, breathing comfortably on room air Abdomen: Soft, nontender, +BS Extremities: WWP, 2+ pitting edema to right lower extremity up to mid ankle, s/p left BKA  Laboratory: Recent Labs  Lab  08/07/20 1854 08/08/20 0000 08/09/20 0305  WBC 12.2* 8.9 8.1  HGB 9.1* 8.9* 7.3*  HCT 28.1* 27.8* 22.5*  PLT 274 270 245   Recent Labs  Lab 08/07/20 0322 08/07/20 1512 08/07/20 1628 08/07/20 1854 08/08/20 0000 08/09/20 0305  NA 134*   < > 136  --  134* 129*  K 4.0   < > 3.9  --  4.8 3.8  CL 102   < > 100  --  101 97*  CO2 27  --   --   --  25 27  BUN 21   < > 20  --  24* 38*  CREATININE 1.22   < > 1.20 1.45* 1.58* 2.67*  CALCIUM 8.5*  --   --   --  8.0* 8.3*  GLUCOSE 135*   < > 88  --  127* 138*   < > = values in this interval not displayed.     Imaging/Diagnostic Tests: No new imaging.   Zola Button, MD 08/09/2020, 7:33 AM PGY-1, [place] Intern pager: [phone], text pages welcome

## 2020-08-10 ENCOUNTER — Inpatient Hospital Stay (HOSPITAL_COMMUNITY): Payer: Medicare PPO

## 2020-08-10 DIAGNOSIS — I4819 Other persistent atrial fibrillation: Secondary | ICD-10-CM | POA: Diagnosis not present

## 2020-08-10 DIAGNOSIS — I10 Essential (primary) hypertension: Secondary | ICD-10-CM | POA: Diagnosis not present

## 2020-08-10 DIAGNOSIS — E1149 Type 2 diabetes mellitus with other diabetic neurological complication: Secondary | ICD-10-CM | POA: Diagnosis not present

## 2020-08-10 DIAGNOSIS — L03119 Cellulitis of unspecified part of limb: Secondary | ICD-10-CM | POA: Diagnosis not present

## 2020-08-10 LAB — CBC
HCT: 23.3 % — ABNORMAL LOW (ref 39.0–52.0)
Hemoglobin: 7.7 g/dL — ABNORMAL LOW (ref 13.0–17.0)
MCH: 29.1 pg (ref 26.0–34.0)
MCHC: 33 g/dL (ref 30.0–36.0)
MCV: 87.9 fL (ref 80.0–100.0)
Platelets: 236 10*3/uL (ref 150–400)
RBC: 2.65 MIL/uL — ABNORMAL LOW (ref 4.22–5.81)
RDW: 13.6 % (ref 11.5–15.5)
WBC: 8.9 10*3/uL (ref 4.0–10.5)
nRBC: 0 % (ref 0.0–0.2)

## 2020-08-10 LAB — BASIC METABOLIC PANEL
Anion gap: 8 (ref 5–15)
BUN: 46 mg/dL — ABNORMAL HIGH (ref 8–23)
CO2: 26 mmol/L (ref 22–32)
Calcium: 8.3 mg/dL — ABNORMAL LOW (ref 8.9–10.3)
Chloride: 99 mmol/L (ref 98–111)
Creatinine, Ser: 2.64 mg/dL — ABNORMAL HIGH (ref 0.61–1.24)
GFR, Estimated: 26 mL/min — ABNORMAL LOW (ref >60)
Glucose, Bld: 125 mg/dL — ABNORMAL HIGH (ref 70–99)
Potassium: 4.1 mmol/L (ref 3.5–5.1)
Sodium: 133 mmol/L — ABNORMAL LOW (ref 135–145)

## 2020-08-10 LAB — PROTIME-INR
INR: 1.4 — ABNORMAL HIGH (ref 0.8–1.2)
Prothrombin Time: 17.4 seconds — ABNORMAL HIGH (ref 11.4–15.2)

## 2020-08-10 LAB — HEMOGLOBIN AND HEMATOCRIT, BLOOD
HCT: 27 % — ABNORMAL LOW (ref 39.0–52.0)
Hemoglobin: 8.7 g/dL — ABNORMAL LOW (ref 13.0–17.0)

## 2020-08-10 LAB — GLUCOSE, CAPILLARY
Glucose-Capillary: 123 mg/dL — ABNORMAL HIGH (ref 70–99)
Glucose-Capillary: 80 mg/dL (ref 70–99)
Glucose-Capillary: 74 mg/dL (ref 70–99)
Glucose-Capillary: 78 mg/dL (ref 70–99)
Glucose-Capillary: 132 mg/dL — ABNORMAL HIGH (ref 70–99)

## 2020-08-10 LAB — OCCULT BLOOD X 1 CARD TO LAB, STOOL: Fecal Occult Bld: NEGATIVE

## 2020-08-10 LAB — PREPARE RBC (CROSSMATCH)

## 2020-08-10 IMAGING — CT CT ABD-PELV W/O CM
2 of 4 series · 17 of 46 positions shown, 19 images · non-contrast
Comparison: None.

CLINICAL DATA: Anemia, concern for intra-abdominal bleed

EXAM:
CT ABDOMEN AND PELVIS WITHOUT CONTRAST
TECHNIQUE: Multidetector CT imaging of the abdomen and pelvis was performed
following the standard protocol without IV contrast.

[Series 3: a/p w/o 5mm · axial · non-contrast · 0.98mm/px · z∈[+744,+1189]mm · 14 of 97 slices shown, 16 images]
[im 4/97  soft-tissue]
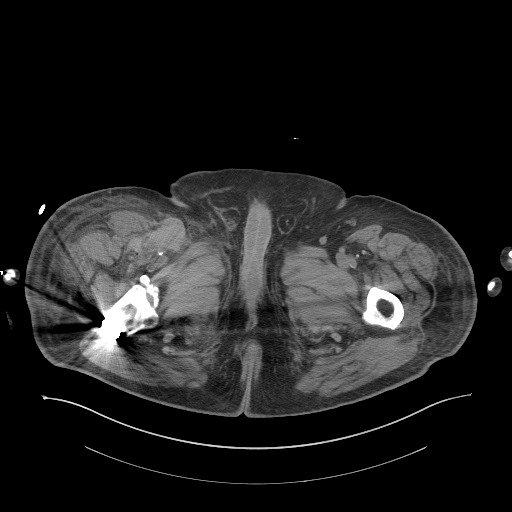
[im 4/97  bone]
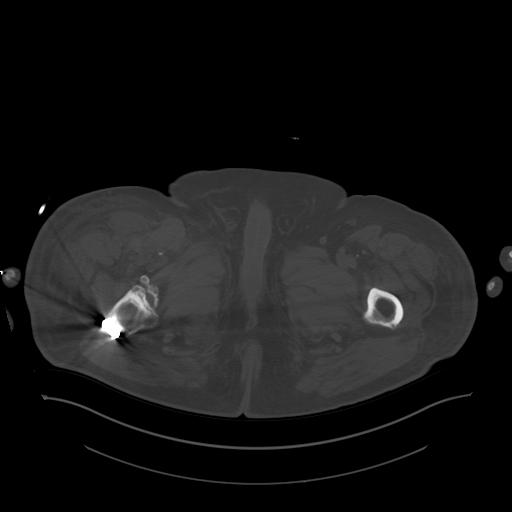
[im 12/97  soft-tissue]
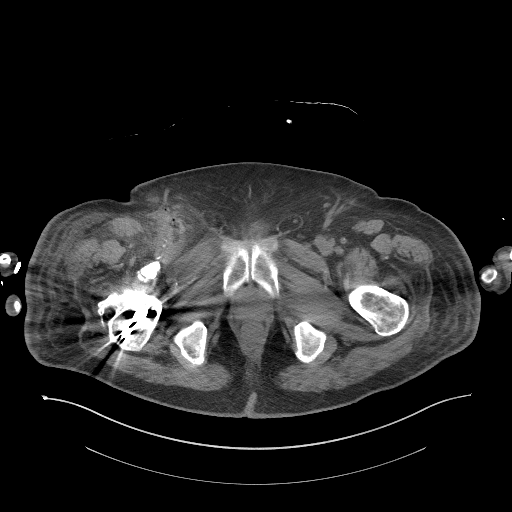
[im 19/97  soft-tissue]
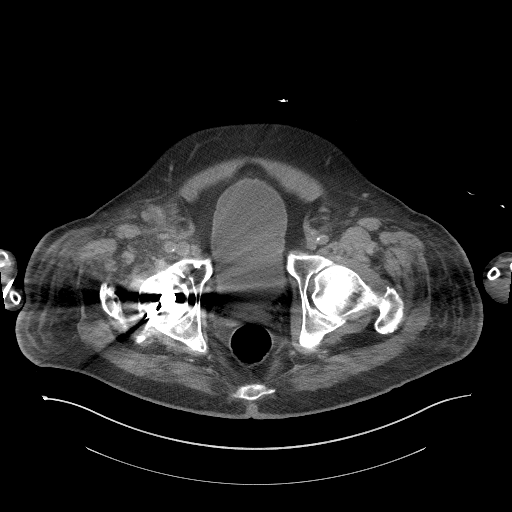
[im 26/97  soft-tissue]
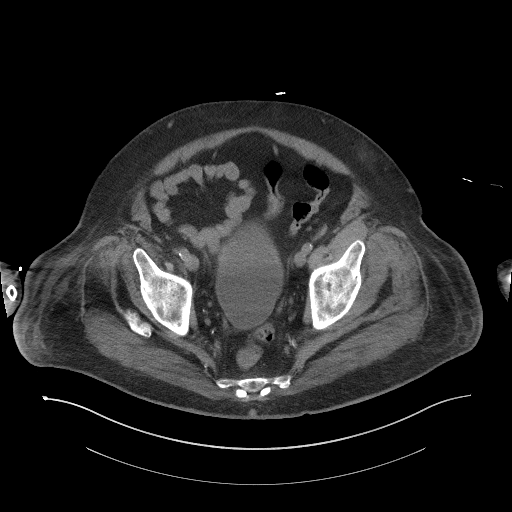
[im 34/97  soft-tissue]
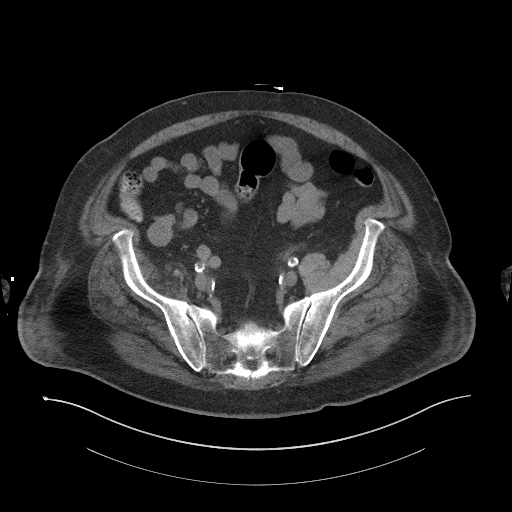
[im 37/97  soft-tissue]
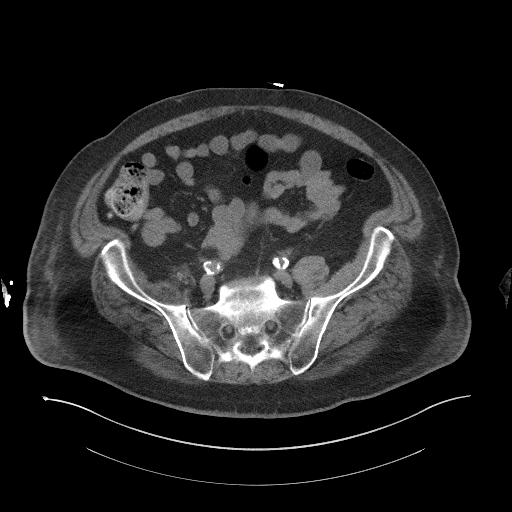
[im 45/97  soft-tissue]
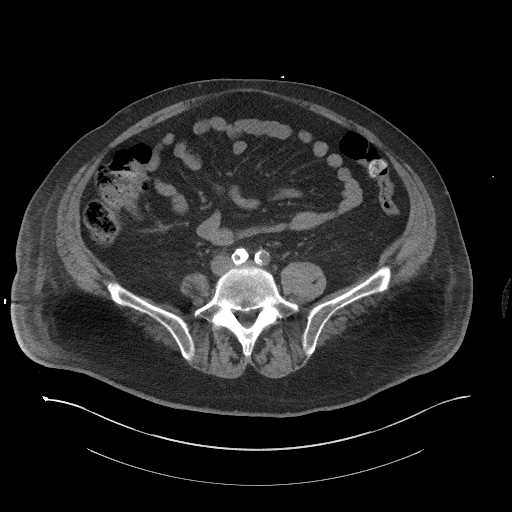
[im 52/97  soft-tissue]
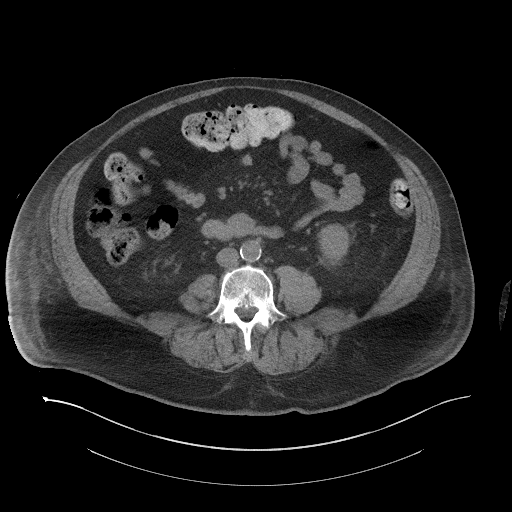
[im 60/97  soft-tissue]
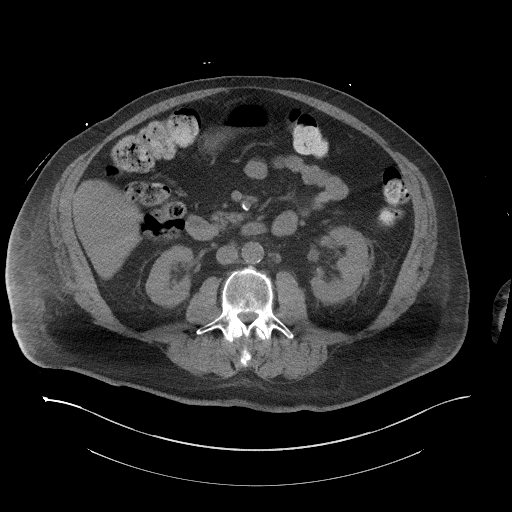
[im 60/97  bone]
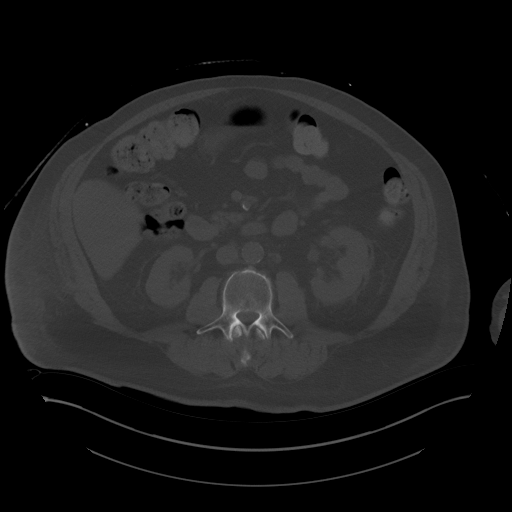
[im 63/97  soft-tissue]
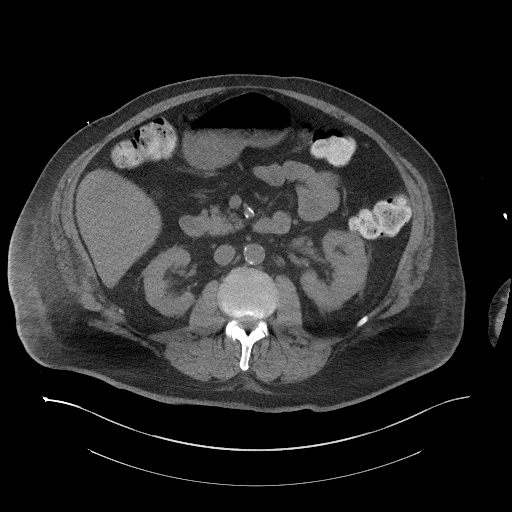
[im 71/97  soft-tissue]
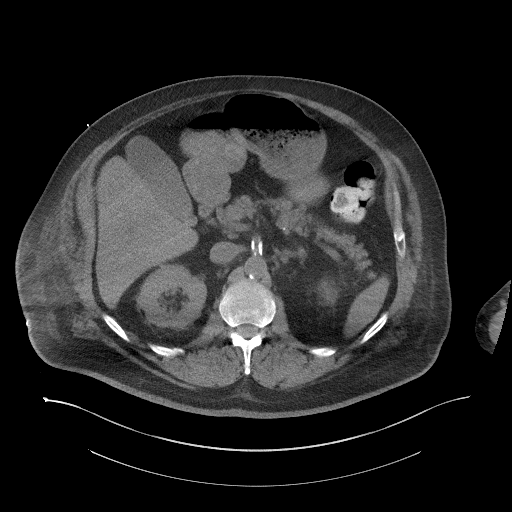
[im 78/97  soft-tissue]
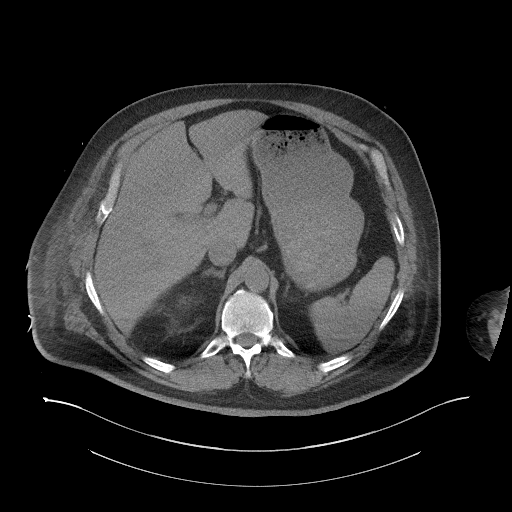
[im 85/97  soft-tissue]
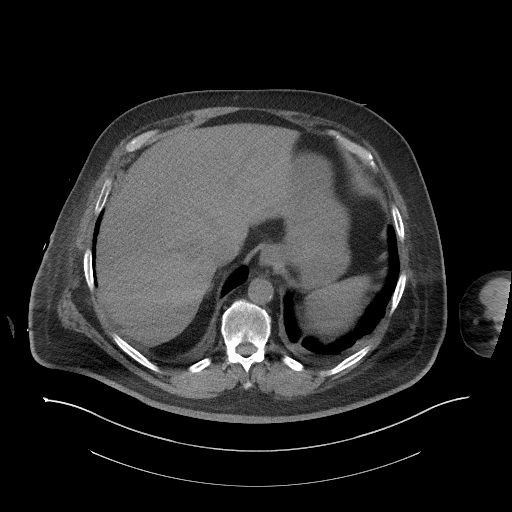
[im 93/97  soft-tissue]
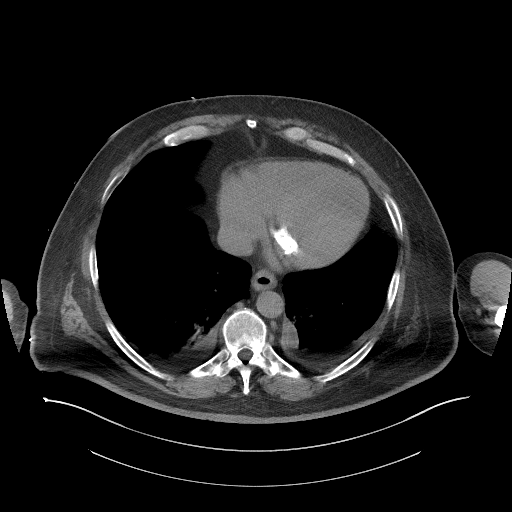

[Series 6: a/p w/o cor · coronal · non-contrast · 0.95mm/px · 3 of 161 slices shown]
[im 54/161  soft-tissue]
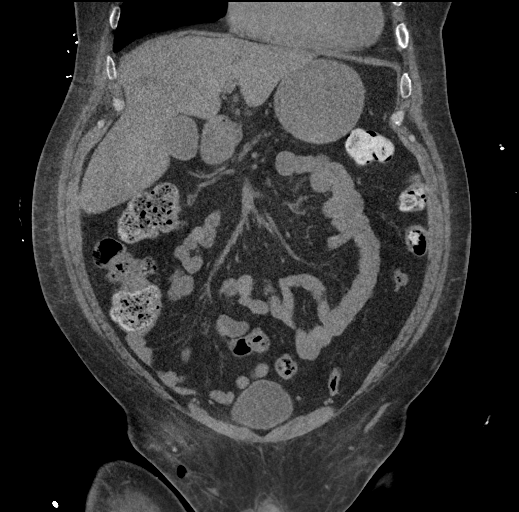
[im 72/161  soft-tissue]
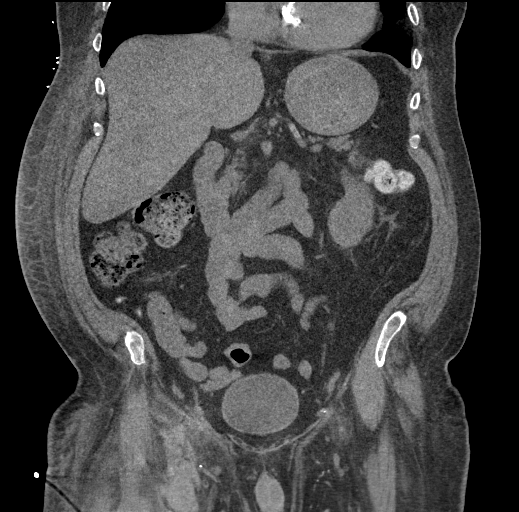
[im 89/161  soft-tissue]
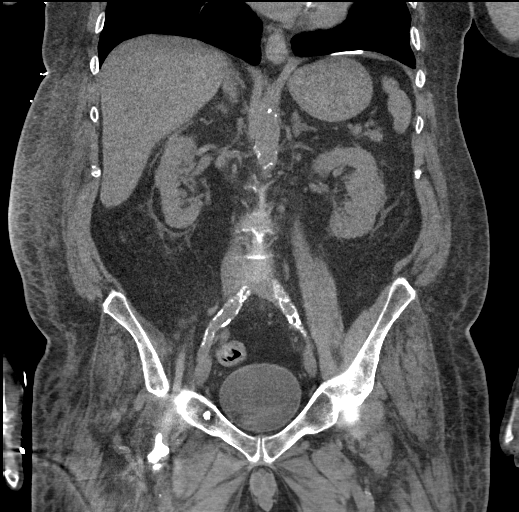

[17 of 46 positions shown; findings below may reference images not displayed]

FINDINGS: Lower chest: Small bilateral pleural effusions. Bibasilar
atelectasis. Dense mitral valve annular calcifications.

Hepatobiliary: No focal hepatic abnormality. Gallbladder
unremarkable.

Pancreas: No focal abnormality or ductal dilatation.

Spleen: No focal abnormality.  Normal size.

Adrenals/Urinary Tract: No adrenal abnormality. No focal renal
abnormality. No stones or hydronephrosis. Urinary bladder is
unremarkable.

Stomach/Bowel: Normal appendix. Stomach, large and small bowel
grossly unremarkable.

Vascular/Lymphatic: Aortoiliac atherosclerosis. No evidence of
aneurysm or adenopathy. No retroperitoneal hematoma.

Reproductive: Mildly prominent prostate.

Other: No free fluid or free air. Postoperative changes noted in the
right groin region. 4.0 x 3.3 cm slightly hyperdense soft tissue
noted anterior to the vessels in the upper thigh and right groin
region, likely small postoperative hematoma.

Musculoskeletal: No acute bony abnormality.
IMPRESSION: No retroperitoneal hematoma.

Postoperative changes in the right groin. 4 x 3.3 cm hematoma seen
in the right groin/upper thigh anterior to the vessels.

Diffuse aortoiliac atherosclerosis.

Mildly prominent prostate.

## 2020-08-10 MED ORDER — WARFARIN SODIUM 7.5 MG PO TABS: 7.5000 mg | ORAL_TABLET | Freq: Once | ORAL | Status: DC

## 2020-08-10 MED ORDER — SODIUM CHLORIDE 0.9% IV SOLUTION: Freq: Once | INTRAVENOUS | Status: AC

## 2020-08-10 MED ORDER — POLYETHYLENE GLYCOL 3350 17 G PO PACK
17.0000 g | PACK | Freq: Every day | ORAL | Status: DC
  Administered 2020-08-12: 17 g via ORAL
  Filled 2020-08-10: qty 1

## 2020-08-10 NOTE — Progress Notes (Signed)
Patient stated he does not want any medications to move his bowels when I offered.  Rodney Larsen, RN

## 2020-08-10 NOTE — Progress Notes (Addendum)
Mobility Specialist: Progress Note   08/10/20 1200  Mobility  Activity Ambulated in room  Level of Assistance +2 (takes two people)  Games developer wheel walker  Distance Ambulated (ft) 20 ft  Mobility Response Tolerated fair  Mobility performed by Mobility specialist  $Mobility charge 1 Mobility   Post-Mobility: 82 HR, 148/71 BP, 98% SpO2  Pt c/o 5/10 pain in RLE pre-mobility. Pt was modA to sit EOB from supine and +2 to stand with RN. Pt agreeable to ambulate to the door and back using prosthesis. Pt had to sit in chair in front of the sink on the way back due to fatigue. Pt too weak to stand and walk to bed so chair was slid to bedside for sliding transfer. Pt c/o "12/10" pain after returning to bed and requesting pain medication, RN present. Pt sitting EOB with call bell by his side. Recommend +2 for safety and chair follow.   [facility] Deklynn Charlet Mobility Specialist Mobility Specialist Phone: [phone]

## 2020-08-10 NOTE — Progress Notes (Signed)
PT Cancellation Note  Patient Details Name: ODILON CASS MRN: [id number] DOB: January 23, 1951   Cancelled Treatment:    Reason Eval/Treat Not Completed: Pain limiting ability to participate adamantly refuses therapy due to pain. Educated on benefits of increased mobility but unable to convince him to participate with PT this afternoon.    Windell Norfolk, DPT, PN1   Supplemental Physical Therapist [facility]    Pager [phone] Acute Rehab Office [phone]

## 2020-08-10 NOTE — Progress Notes (Signed)
Dressing to right foot changed. Dressing applied.  Daymon Larsen, RN

## 2020-08-10 NOTE — Progress Notes (Addendum)
  Progress Note    08/10/2020 8:58 AM 3 Days Post-Op  Subjective: Continues to have pain in right lower extremity  Vitals:   08/10/20 0615 08/10/20 0700  BP: 124/62 127/65  Pulse: 65 71  Resp: 20 14  Temp: 98.2 F (36.8 C) 98.3 F (36.8 C)  SpO2: 95% 96%    Physical Exam: Awake alert oriented Nonlabored respirations Right lower extremity incisions clean dry intact Nonpitting edema right leg below the knee Strong right anterior tibial and posterior tibial signals Blistering of right great toe is resolving without any underlying gangrene  CBC    Component Value Date/Time   WBC 8.9 08/10/2020 0108   RBC 2.65 (L) 08/10/2020 0108   HGB 7.7 (L) 08/10/2020 0108   HGB 12.4 (L) 02/15/2020 0912   HCT 23.3 (L) 08/10/2020 0108   HCT 36.8 (L) 02/15/2020 0912   PLT 236 08/10/2020 0108   PLT 281 02/15/2020 0912   MCV 87.9 08/10/2020 0108   MCV 91 02/15/2020 0912   MCH 29.1 08/10/2020 0108   MCHC 33.0 08/10/2020 0108   RDW 13.6 08/10/2020 0108   RDW 12.2 02/15/2020 0912   LYMPHSABS 1.3 04/03/2020 0956   MONOABS 0.3 04/03/2020 0956   EOSABS 0.0 04/03/2020 0956   BASOSABS 0.1 04/03/2020 0956    BMET    Component Value Date/Time   NA 133 (L) 08/10/2020 0108   NA 136 07/30/2020 1357   K 4.1 08/10/2020 0108   CL 99 08/10/2020 0108   CO2 26 08/10/2020 0108   GLUCOSE 125 (H) 08/10/2020 0108   BUN 46 (H) 08/10/2020 0108   BUN 40 (H) 07/30/2020 1357   CREATININE 2.64 (H) 08/10/2020 0108   CREATININE 1.19 10/09/2015 1554   CALCIUM 8.3 (L) 08/10/2020 0108   GFRNONAA 26 (L) 08/10/2020 0108   GFRNONAA 65 10/09/2015 1554   GFRAA 52 (L) 05/01/2020 1632   GFRAA 75 10/09/2015 1554    INR    Component Value Date/Time   INR 1.4 (H) 08/10/2020 0108     Intake/Output Summary (Last 24 hours) at 08/10/2020 0858 Last data filed at 08/09/2020 2138 Gross per 24 hour  Intake 933 ml  Output 175 ml  Net 758 ml     Assessment:  70 y.o. male is s/p right femoral to popliteal  artery bypass with vein for tissue loss with blistering right great toe.  Plan: Acute blood loss anemia: Transfuse 1 unit today, does not appear to be bleeding actively  Right lower extremity well-perfused incisions are healing well, I have again discussed with him the need for increased mobility  He is on aspirin statin and Coumadin  Acute on chronic kidney insufficiency work-up per primary team   Jakwon Gayton C. Donzetta Matters, MD [facility] [place] Office: [phone] Pager: [phone]  08/10/2020 8:58 AM

## 2020-08-10 NOTE — Progress Notes (Addendum)
ANTICOAGULATION CONSULT NOTE - Follow-up Consult  Pharmacy Consult for warfarin dosing Indication: atrial fibrillation  No Known Allergies  Patient Measurements: Height: 6' (182.9 cm) Weight: 111.6 kg (246 lb 0.5 oz) IBW/kg (Calculated) : 77.6  Vital Signs: Temp: 98.3 F (36.8 C) (04/16 0903) Temp Source: Oral (04/16 0903) BP: 121/61 (04/16 0903) Pulse Rate: 64 (04/16 0903)  Labs: Recent Labs    08/08/20 0000 08/09/20 0305 08/09/20 0732 08/09/20 1435 08/09/20 2251 08/10/20 0108  HGB 8.9* 7.3* 7.1* 7.8* 7.4* 7.7*  HCT 27.8* 22.5* 22.4* 23.8* 22.9* 23.3*  PLT 270 245  --   --   --  236  LABPROT  --  17.3*  --   --   --  17.4*  INR  --  1.4*  --   --   --  1.4*  CREATININE 1.58* 2.67* 2.45*  --   --  2.64*    Estimated Creatinine Clearance: 34.5 mL/min (A) (by C-G formula based on SCr of 2.64 mg/dL (H)).   Medical History: Past Medical History:  Diagnosis Date  . CHF (congestive heart failure) ([place])   . Coronary artery disease   . Diabetes mellitus without complication ([place])   . HLD (hyperlipidemia)   . Hypertension   . Peripheral edema 02/24/2018  . SOB (shortness of breath) 07/26/2018    Assessment: 70 yo M on warfarin PTA for afib. PTA dose 5mg  daily except 7.5 mg MWF. INR 1.6 on admit. Warfarin held by [facility] x4 days. Patient s/p R common femoral to below-knee popliteal bypass 4/13. No further procedures planned, MD requested re-initiation of warfarin and bridging with enoxaparin. Given decrease in Hgb and no active clot, stopped enoxaparin and continuing with warfarin alone.   INR still subtherapeutic at 1.4. Hgb 7's, receiving pRBCs, Plts 245.  Goal of Therapy:  INR 2-3 Monitor platelets by anticoagulation protocol: Yes   Plan:  Warfarin 7.5 mg x1 today   Monitor daily INR, CBC & signs/symptoms of bleeding   Update: Warfarin on hold due to drop in Hgb. Ordering CT abdomen to rule out GI bleed.  Romilda Garret, PharmD PGY1 Acute Care Pharmacy  Resident 08/10/2020 9:56 AM  Please check [email or web address] for unit specific pharmacy phone numbers.

## 2020-08-10 NOTE — Progress Notes (Signed)
Hgb measurements s/p 2 units pRBC on 4/15 were 7.4, 7.7. Given transfusion threshold of 8, will order one more unit of pRBC.   RN to order post-H&H at appropriate interval after this unit is complete.   Ezequiel Essex, MD

## 2020-08-10 NOTE — Progress Notes (Signed)
Spoke with Nicoletta Ba, PA with [facility].  She did not think he was having a GI bleed based on absence of clinical signs of GI bleed.  She recommended ordering a CT scan of the abdomen without contrast to rule out intra-abdominal bleed.  Patient will be on their list, but further evaluation will be pending results of the CT scan.  We will plan to reach out after CT results.

## 2020-08-10 NOTE — Progress Notes (Signed)
Family Medicine Teaching Service Daily Progress Note Intern Pager: [phone]  Patient name: Rodney [name] Medical record number: [id number] Date of birth: 11/22/1950 Age: 70 y.o. Gender: male  Primary Care Provider: [name], MD Consultants: none   Code Status: Full Code  Pt Overview and Major Events to Date:  4/7 admitted 4/11 arteriogram with right common iliac stenting 4/13 right superficial femoral artery to below-knee popliteal artery bypass 4/15 2u pRBC transfused 4/16 1u pRBC transfused  Assessment and Plan: [name] is a 70 y.o. male who presents with right foot swelling and erythema concerning for cellulitis and PAD. 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  Right foot cellulitis Doing well on oral antibiotics. Plan for 10-day total course of antibiotics. Antibiotics renally dosed now given AKI. - doxycycline (4/11-) - cephalexin (4/11-) - s/p IV vancomycin (4/7-11) - s/p IV cefepime (4/7-11) - elevate foot - wound care recommendations: Cut an Aquacel dressing Kellie Simmering [phone]) into a long strip. Weave between toes on right foot. Secure with a few turns of kerlex. Change daily.  PAD S/p stenting of the right common iliac 4/11 and RLE bypass surgery 4/13.  Pain adequately controlled with oral medications but limiting mobility.  On ASA and statin. - APAP prn - oxycodone-APAP 5-325 mg q4h prn - pentoxifylline  - vascular surgery consulted, appreciate recommendations  Acute blood loss anemia Currently undergoing his third blood transfusion since yesterday.  Warfarin was recently restarted 2 days ago, originally bridging with LMWH but discontinued yesterday due to concern for bleeding.  No symptoms of GI bleed and no active bleeding from incision sites. - f/u post-transfusion H/H - Transfusion threshold 8 - consider GI  consult  HTN Normotensive. - hold losartan 50 mg - home carvedilol 12.5 mg BID  T2DM Adequately controlled on current regimen. - 70/30 insulin 20 units AM, 10 units PM - hold metformin - monitor CBG  HFpEF Lower extremity edema worsening, likely confounded by postoperative swelling and with his diuretics being held. - hold furosemide 40 mg daily given AKI - strict I/O, daily weights  Persistent A Fib In NSR.  Warfarin restarted, no longer bridging. - warfarin per pharmacy - monitor INR - amiodarone 200 mg daily  AKI superimposed on CKD IIIa Not significantly changed today with creatinine 2.65.  Suspect prerenal in the setting of hypovolemia from acute blood loss. - avoid nephrotoxic medications - holding ARB, furosemide - renally adjusting medications - monitor BMP  CAD On secondary prevention. - continue atorvastatin 80 mg daily - continue ASA 81 mg daily  FEN/GI: carb-modified diet PPx: warfarin  Disposition: med-tele  Subjective:  Yesterday, patient received a second blood transfusion due to hemoglobin being below threshold of 8.  Overnight, posttransfusion hemoglobin was still below threshold and was ordered for another unit of packed red blood cells. This morning, patient denies any melena, hematochezia, hematemesis.  He denies any known bleeding.  He states he is still having significant pain in his right lower extremity with any movement, but is improved with pain medications.  He did not get up to ambulate yesterday.  Objective: Temp:  [98.2 F (36.8 C)-99.7 F (37.6 C)] 98.3 F (36.8 C) (04/16 0700) Pulse Rate:  [62-77] 71 (04/16 0700) Resp:  [14-20] 14 (04/16 0700) BP: (93-127)/(52-65) 127/65 (04/16 0700) SpO2:  [90 %-96 %] 96 % (04/16 0700) Weight:  [111.6 kg] 111.6 kg (04/16 0419) Physical Exam: General:  Obese male laying in bed comfortably, NAD Cardiovascular: RRR, 3/6 systolic murmur best heard at the LUSB Respiratory: CTAB, breathing  comfortably on room air Abdomen: Soft, nontender, +BS Extremities: WWP, 3+ pitting edema to right lower extremity up to mid calf, s/p left BKA Derm: incision sites on right lower extremity CDI  Laboratory: Recent Labs  Lab 08/08/20 0000 08/09/20 0305 08/09/20 0732 08/09/20 1435 08/09/20 2251 08/10/20 0108  WBC 8.9 8.1  --   --   --  8.9  HGB 8.9* 7.3*   < > 7.8* 7.4* 7.7*  HCT 27.8* 22.5*   < > 23.8* 22.9* 23.3*  PLT 270 245  --   --   --  236   < > = values in this interval not displayed.   Recent Labs  Lab 08/09/20 0305 08/09/20 0732 08/10/20 0108  NA 129* 128* 133*  K 3.8 3.9 4.1  CL 97* 98 99  CO2 27 24 26   BUN 38* 39* 46*  CREATININE 2.67* 2.45* 2.64*  CALCIUM 8.3* 8.2* 8.3*  GLUCOSE 138* 132* 125*     Imaging/Diagnostic Tests: No new imaging.   Zola Button, MD 08/10/2020, 7:37 AM PGY-1, [place] Intern pager: [phone], text pages welcome

## 2020-08-11 DIAGNOSIS — D649 Anemia, unspecified: Secondary | ICD-10-CM

## 2020-08-11 DIAGNOSIS — L03119 Cellulitis of unspecified part of limb: Secondary | ICD-10-CM | POA: Diagnosis not present

## 2020-08-11 DIAGNOSIS — I739 Peripheral vascular disease, unspecified: Secondary | ICD-10-CM | POA: Diagnosis not present

## 2020-08-11 DIAGNOSIS — I4819 Other persistent atrial fibrillation: Secondary | ICD-10-CM | POA: Diagnosis not present

## 2020-08-11 LAB — GLUCOSE, CAPILLARY
Glucose-Capillary: 124 mg/dL — ABNORMAL HIGH (ref 70–99)
Glucose-Capillary: 131 mg/dL — ABNORMAL HIGH (ref 70–99)
Glucose-Capillary: 114 mg/dL — ABNORMAL HIGH (ref 70–99)
Glucose-Capillary: 112 mg/dL — ABNORMAL HIGH (ref 70–99)

## 2020-08-11 LAB — TYPE AND SCREEN
ABO/RH(D): A POS
Antibody Screen: NEGATIVE
Unit division: 0
Unit division: 0
Unit division: 0

## 2020-08-11 LAB — BPAM RBC
Blood Product Expiration Date: 202205062359
Blood Product Expiration Date: 202205072359
Blood Product Expiration Date: 202205122359
ISSUE DATE / TIME: 202204151038
ISSUE DATE / TIME: 202204151831
ISSUE DATE / TIME: 202204160635
Unit Type and Rh: 6200
Unit Type and Rh: 6200
Unit Type and Rh: 6200

## 2020-08-11 LAB — BASIC METABOLIC PANEL
Anion gap: 6 (ref 5–15)
BUN: 52 mg/dL — ABNORMAL HIGH (ref 8–23)
CO2: 26 mmol/L (ref 22–32)
Calcium: 8.3 mg/dL — ABNORMAL LOW (ref 8.9–10.3)
Chloride: 101 mmol/L (ref 98–111)
Creatinine, Ser: 2.25 mg/dL — ABNORMAL HIGH (ref 0.61–1.24)
GFR, Estimated: 31 mL/min — ABNORMAL LOW (ref >60)
Glucose, Bld: 135 mg/dL — ABNORMAL HIGH (ref 70–99)
Potassium: 4 mmol/L (ref 3.5–5.1)
Sodium: 133 mmol/L — ABNORMAL LOW (ref 135–145)

## 2020-08-11 LAB — CBC
HCT: 25.5 % — ABNORMAL LOW (ref 39.0–52.0)
Hemoglobin: 8.3 g/dL — ABNORMAL LOW (ref 13.0–17.0)
MCH: 28.6 pg (ref 26.0–34.0)
MCHC: 32.5 g/dL (ref 30.0–36.0)
MCV: 87.9 fL (ref 80.0–100.0)
Platelets: 252 10*3/uL (ref 150–400)
RBC: 2.9 MIL/uL — ABNORMAL LOW (ref 4.22–5.81)
RDW: 13.5 % (ref 11.5–15.5)
WBC: 7.4 10*3/uL (ref 4.0–10.5)
nRBC: 0 % (ref 0.0–0.2)

## 2020-08-11 LAB — PROTIME-INR
INR: 1.8 — ABNORMAL HIGH (ref 0.8–1.2)
Prothrombin Time: 20.8 seconds — ABNORMAL HIGH (ref 11.4–15.2)

## 2020-08-11 MED ORDER — WARFARIN - PHARMACIST DOSING INPATIENT: Freq: Every day | Status: DC

## 2020-08-11 MED ORDER — WARFARIN SODIUM 7.5 MG PO TABS
7.5000 mg | ORAL_TABLET | Freq: Once | ORAL | Status: AC
  Administered 2020-08-11: 7.5 mg via ORAL
  Filled 2020-08-11: qty 1

## 2020-08-11 NOTE — Progress Notes (Addendum)
Family Medicine Teaching Service Daily Progress Note Intern Pager: [phone]  Patient name: Rodney [name] Medical record number: [id number] Date of birth: 1950-06-28 Age: 70 y.o. Gender: male  Primary Care Provider: [name], MD Consultants: none Code Status: Full  Pt Overview and Major Events to Date:  4/7 admitted 4/11 arteriogram with right common iliac stenting 4/13 right superficial femoral artery to below-knee popliteal artery bypass 4/15 2u pRBC transfused 4/16 1u pRBC transfused  Assessment and Plan: MARSHALL KAMPF is a 70 y.o. male who presents with right foot swelling and erythema concerning for cellulitis and PAD. 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  Right foot cellulitis Tolerating oral antibiotics well thus far, wrapped in clean and  dry dressing this morning with notable bleeding or saturations.Plan for 10-day total course of antibiotics. Antibiotics renally dosed now given AKI. - doxycycline (4/11-) - cephalexin (4/11-) - s/p IV vancomycin (4/7-11) - s/p IV cefepime (4/7-11) - elevate foot - wound care consulted with recommendations: Cut an Aquacel dressing Kellie Simmering [phone]) into a long strip. Weave between toes on right foot. Secure with a few turns of kerlex. Change daily.  PAD S/p stenting of the right common iliac 4/11 and RLE bypass surgery 4/13.  Pain adequately controlled with oral medications but limiting mobility.  On ASA and statin. - vascular surgery following, appreciate continued involvement  - APAP prn - oxycodone-APAP 5-325 mg q4h prn - pentoxifylline   Acute blood loss anemia s/p 3 units pRBC, Hgb this morning 8.3. Warfarin discontinued again due to concern for bleeding. No known source of active bleeding. CT abdomen/pelvis demonstrated post-operative changes consistent with 4x3.3 cm hematoma noted in  the right groin/upper thigh anterior to the vessels, on exam no notable bleeding or drainage noted.  - continue monitor Hgb closely  - Transfusion threshold 8 - consider GI consult  HTN Normotensive this morning at BP 140/68. - hold losartan 50 mg - home carvedilol 12.5 mg BID  T2DM CBG 124 this morning. Adequately controlled on current regimen. - 70/30 insulin 20 units AM, 10 units PM - hold metformin - monitor CBG  HFpEF Lower extremity edema worsening, likely confounded by postoperative swelling and with his diuretics being held. - hold furosemide 40 mg daily given AKI - strict I/O -daily weights  Persistent A Fib In NSR.  Warfarin restarted, no longer bridging. - warfarin per pharmacy - monitor INR - amiodarone 200 mg daily  AKI superimposed on CKD IIIa Cr 2.25  Suspect prerenal in the setting of hypovolemia from acute blood loss. - avoid nephrotoxic medications - holding ARB, furosemide - renally adjusting medications - monitor BMP  CAD On secondary prevention. - continue atorvastatin 80 mg daily - continue ASA 81 mg daily  FEN/GI: carb modified  PPx: warfarin    Status is: Inpatient  Remains inpatient appropriate because:Ongoing diagnostic testing needed not appropriate for outpatient work up   Dispo: The patient is from: Home              Anticipated d/c is to: Home              Patient currently is not medically stable to d/c.   Difficult to place patient No        Subjective:  No acute overnight events reported. Denies any dyspnea, generalized or localized pain.   Objective: Temp:  [98 F (36.7 C)-98.8 F (37.1 C)] 98.4 F (  36.9 C) (04/17 0752) Pulse Rate:  [27-78] 72 (04/17 0752) Resp:  [15-20] 20 (04/17 0752) BP: (100-148)/(57-71) 140/68 (04/17 0752) SpO2:  [93 %-97 %] 95 % (04/17 0752) Physical Exam: General: Patient sitting upright in bed, in no acute distress. Cardiovascular: 3/6 systolic murmur noted along left sternal  border, RRR Respiratory: CTAB Abdomen: soft, nontender, BS+ Extremities: s/p left BKA, 1+ pitting right LE edema noted, radial and distal pulses strong and equal bilaterally, incision on right LE noted to have surrounding erythema (pic below) and warm to touch Neuro: alert and appropriately conversational  Physical exam performed in the presence of RN.        Laboratory: Recent Labs  Lab 08/09/20 0305 08/09/20 0732 08/10/20 0108 08/10/20 1101 08/11/20 0139  WBC 8.1  --  8.9  --  7.4  HGB 7.3*   < > 7.7* 8.7* 8.3*  HCT 22.5*   < > 23.3* 27.0* 25.5*  PLT 245  --  236  --  252   < > = values in this interval not displayed.   Recent Labs  Lab 08/09/20 0732 08/10/20 0108 08/11/20 0139  NA 128* 133* 133*  K 3.9 4.1 4.0  CL 98 99 101  CO2 24 26 26   BUN 39* 46* 52*  CREATININE 2.45* 2.64* 2.25*  CALCIUM 8.2* 8.3* 8.3*  GLUCOSE 132* 125* 135*      Imaging/Diagnostic Tests: CT ABDOMEN PELVIS WO CONTRAST  Result Date: 08/10/2020 CLINICAL DATA:  Anemia, concern for intra-abdominal bleed EXAM: CT ABDOMEN AND PELVIS WITHOUT CONTRAST TECHNIQUE: Multidetector CT imaging of the abdomen and pelvis was performed following the standard protocol without IV contrast. COMPARISON:  None. FINDINGS: Lower chest: Small bilateral pleural effusions. Bibasilar atelectasis. Dense mitral valve annular calcifications. Hepatobiliary: No focal hepatic abnormality. Gallbladder unremarkable. Pancreas: No focal abnormality or ductal dilatation. Spleen: No focal abnormality.  Normal size. Adrenals/Urinary Tract: No adrenal abnormality. No focal renal abnormality. No stones or hydronephrosis. Urinary bladder is unremarkable. Stomach/Bowel: Normal appendix. Stomach, large and small bowel grossly unremarkable. Vascular/Lymphatic: Aortoiliac atherosclerosis. No evidence of aneurysm or adenopathy. No retroperitoneal hematoma. Reproductive: Mildly prominent prostate. Other: No free fluid or free air. Postoperative  changes noted in the right groin region. 4.0 x 3.3 cm slightly hyperdense soft tissue noted anterior to the vessels in the upper thigh and right groin region, likely small postoperative hematoma. Musculoskeletal: No acute bony abnormality. IMPRESSION: No retroperitoneal hematoma. Postoperative changes in the right groin. 4 x 3.3 cm hematoma seen in the right groin/upper thigh anterior to the vessels. Diffuse aortoiliac atherosclerosis. Mildly prominent prostate. Electronically Signed   By: Rolm Baptise M.D.   On: 08/10/2020 22:34    Donney Dice, DO 08/11/2020, 8:07 AM PGY-1, [place] Intern pager: [phone], text pages welcome

## 2020-08-11 NOTE — Progress Notes (Signed)
Contacted Dr. Donzetta Matters, Vascular surgery, in order to follow up on recommendation for ASA, Plavix and restarting patient's Coumadin for AF.  Dr. Donzetta Matters recommends discontinuing ASA and patient can continue Warfarin with Plavix.  - ASA discontinued   [name], [place], PGY-2  [place] Intern Pager [phone]

## 2020-08-11 NOTE — Progress Notes (Signed)
Mobility Specialist: Progress Note   08/11/20 1212  Mobility  Activity Refused mobility   Pt refused mobility due to pain level. Pt c/o 5/10 pain in RLE at rest. RN present and gave pt pain medication. Despite continued efforts and receiving pain medication pt still refused.   [facility] Koleman Marling Mobility Specialist Mobility Specialist Phone: [phone]

## 2020-08-11 NOTE — Progress Notes (Signed)
Patient requested breathing treatment. Respiratory notified.   Daymon Larsen, RN

## 2020-08-11 NOTE — Progress Notes (Addendum)
VASCULAR SURGERY ASSESSMENT & PLAN:   4 Days Post-Op Right superficial femoral artery to below-knee popliteal artery bypass with ipsilateral, translocated, nonreversed greater saphenous vein secondary to critical right lower extremity ischemia.  RLE well-perfused. On Plavix, aspirin and statin. Refusing PT. Patient counseled repeatedly of importance of attempting OOB and mobility and reassured pain and edema will improve with time.  AKI: serum creatinine down today. History of CKD IIIA.   Acute BLA: Hgb drift to 7.3. s/p additional 1 u PRBCs (3u total). H and H = 8.3/27.0 today. Non-contrasted CT of abd and pelvis yesterday: no RP hematoma.  SUBJECTIVE:   Significant post-op pain limiting mobility; however, he says he put on his prosthesis yesterday to ambulate and had some mild drainage from lower leg incision.   PHYSICAL EXAM:   Vitals:   08/10/20 1650 08/10/20 1926 08/11/20 0005 08/11/20 0400  BP: (!) 100/57 102/62 123/65 120/66  Pulse: (!) 27 61 65 65  Resp: 18 15 18 17   Temp: 98 F (36.7 C) 98.5 F (36.9 C) 98.6 F (37 C) 98.8 F (37.1 C)  TempSrc: Oral Oral Oral Oral  SpO2:  96% 97% 93%  Weight:      Height:       General appearance: Awake, alert in no apparent distress Cardiac: Heart rate and rhythm are regular Respirations: Nonlabored Incisions: Right groin, thigh and lower leg incisions are all well approximated without bleeding or hematoma Extremities: Moderate RLE edema. Right foot is warm with intact sensation and motor function.  Brisk dorsalis pedis, posterior tibial and peroneal artery Doppler signals. Dry dressing place on forefoot.  LABS:   Lab Results  Component Value Date   WBC 7.4 08/11/2020   HGB 8.3 (L) 08/11/2020   HCT 25.5 (L) 08/11/2020   MCV 87.9 08/11/2020   PLT 252 08/11/2020   Lab Results  Component Value Date   CREATININE 2.25 (H) 08/11/2020   Lab Results  Component Value Date   INR 1.8 (H) 08/11/2020   CBG (last 3)  Recent  Labs    08/10/20 1653 08/10/20 1957 08/11/20 0608  GLUCAP 74 132* 124*    PROBLEM LIST:    Principal Problem:   Cellulitis of foot, right Active Problems:   Essential hypertension   Tobacco abuse   DM (diabetes mellitus), type 2 with neurological complications (HCC)   PVD (peripheral vascular disease) (HCC)   HFrEF (heart failure with reduced ejection fraction) (HCC)   Atrial fibrillation (HCC)   S/P BKA (below knee amputation) unilateral, left (HCC)   Nail dystrophy   Infective myositis of right foot   Ascending aorta dilation (HCC)   Gangrene of toe of right foot (HCC)   CURRENT MEDS:   . amiodarone  200 mg Oral Daily  . aspirin EC  81 mg Oral Daily  . atorvastatin  80 mg Oral Daily  . carvedilol  12.5 mg Oral BID WC  . clopidogrel  75 mg Oral Daily  . insulin aspart  0-9 Units Subcutaneous TID WC  . insulin aspart protamine- aspart  10 Units Subcutaneous Q supper  . insulin aspart protamine- aspart  20 Units Subcutaneous Q breakfast  . nicotine  14 mg Transdermal Daily  . nitroGLYCERIN  0.2 mg Transdermal Daily  . pantoprazole  40 mg Oral Daily  . pentoxifylline  400 mg Oral Daily  . polyethylene glycol  17 g Oral Daily  . sodium chloride flush  3 mL Intravenous Q12H    Barbie Banner, PA-C Office:  [phone] 08/11/2020   I have independently interviewed and examine patient and agree with PA assessment and plan above.  Bypass with very strong signals distally with expected postoperative swelling.  He has some ecchymosis of his distal incision does not appear to be infected.  No evidence of ongoing bleeding in the right lower extremity.  He is okay for Coumadin from vascular surgery standpoint.  Hiyab Nhem C. Donzetta Matters, MD [facility] Office: [phone] Pager: [phone]

## 2020-08-11 NOTE — Progress Notes (Signed)
ANTICOAGULATION CONSULT NOTE - Follow-up Consult  Pharmacy Consult for warfarin dosing Indication: atrial fibrillation  No Known Allergies  Patient Measurements: Height: 6' (182.9 cm) Weight: 111.6 kg (246 lb 0.5 oz) IBW/kg (Calculated) : 77.6  Vital Signs: Temp: 98.4 F (36.9 C) (04/17 0752) Temp Source: Oral (04/17 0752) BP: 140/68 (04/17 0752) Pulse Rate: 72 (04/17 0752)  Labs: Recent Labs    08/09/20 0305 08/09/20 0732 08/09/20 1435 08/10/20 0108 08/10/20 1101 08/11/20 0139  HGB 7.3* 7.1*   < > 7.7* 8.7* 8.3*  HCT 22.5* 22.4*   < > 23.3* 27.0* 25.5*  PLT 245  --   --  236  --  252  LABPROT 17.3*  --   --  17.4*  --  20.8*  INR 1.4*  --   --  1.4*  --  1.8*  CREATININE 2.67* 2.45*  --  2.64*  --  2.25*   < > = values in this interval not displayed.    Estimated Creatinine Clearance: 40.5 mL/min (A) (by C-G formula based on SCr of 2.25 mg/dL (H)).   Medical History: Past Medical History:  Diagnosis Date  . CHF (congestive heart failure) ([place])   . Coronary artery disease   . Diabetes mellitus without complication ([place])   . HLD (hyperlipidemia)   . Hypertension   . Peripheral edema 02/24/2018  . SOB (shortness of breath) 07/26/2018    Assessment: 70 yo M on warfarin PTA for afib. Patient s/p R common femoral to below-knee popliteal bypass 4/13. No further procedures planned, MD requested re-initiation of warfarin and bridging with enoxaparin. Given decrease in Hgb and no active clot, stopped enoxaparin and continuing with warfarin alone. Warfarin held 4/16 for acute drop in Hgb. CT abdomen w/ right groin hematoma but no overt bleeding noted, VVS okay with resuming AC.  PTA dose 5mg  daily except 7.5 mg MWF. INR 1.6 on admit.   INR still subtherapeutic at 1.8. Hgb slightly improved after receiving pRBCs, Plts stable.  Goal of Therapy:  INR 2-3 Monitor platelets by anticoagulation protocol: Yes   Plan:  Warfarin 7.5 mg x1 today   Monitor daily INR, CBC &  signs/symptoms of bleeding   Romilda Garret, PharmD PGY1 Acute Care Pharmacy Resident 08/11/2020 11:06 AM  Please check [email or web address] for unit specific pharmacy phone numbers.

## 2020-08-12 DIAGNOSIS — L03119 Cellulitis of unspecified part of limb: Secondary | ICD-10-CM | POA: Diagnosis not present

## 2020-08-12 DIAGNOSIS — I4819 Other persistent atrial fibrillation: Secondary | ICD-10-CM | POA: Diagnosis not present

## 2020-08-12 LAB — BASIC METABOLIC PANEL
Anion gap: 6 (ref 5–15)
BUN: 57 mg/dL — ABNORMAL HIGH (ref 8–23)
CO2: 23 mmol/L (ref 22–32)
Calcium: 8.2 mg/dL — ABNORMAL LOW (ref 8.9–10.3)
Chloride: 103 mmol/L (ref 98–111)
Creatinine, Ser: 2.22 mg/dL — ABNORMAL HIGH (ref 0.61–1.24)
GFR, Estimated: 31 mL/min — ABNORMAL LOW (ref >60)
Glucose, Bld: 109 mg/dL — ABNORMAL HIGH (ref 70–99)
Potassium: 4.4 mmol/L (ref 3.5–5.1)
Sodium: 132 mmol/L — ABNORMAL LOW (ref 135–145)

## 2020-08-12 LAB — GLUCOSE, CAPILLARY
Glucose-Capillary: 138 mg/dL — ABNORMAL HIGH (ref 70–99)
Glucose-Capillary: 116 mg/dL — ABNORMAL HIGH (ref 70–99)
Glucose-Capillary: 109 mg/dL — ABNORMAL HIGH (ref 70–99)
Glucose-Capillary: 115 mg/dL — ABNORMAL HIGH (ref 70–99)

## 2020-08-12 LAB — PROTIME-INR
INR: 1.7 — ABNORMAL HIGH (ref 0.8–1.2)
Prothrombin Time: 19.6 seconds — ABNORMAL HIGH (ref 11.4–15.2)

## 2020-08-12 LAB — CBC
HCT: 27.4 % — ABNORMAL LOW (ref 39.0–52.0)
Hemoglobin: 8.8 g/dL — ABNORMAL LOW (ref 13.0–17.0)
MCH: 29.5 pg (ref 26.0–34.0)
MCHC: 32.1 g/dL (ref 30.0–36.0)
MCV: 91.9 fL (ref 80.0–100.0)
Platelets: 271 10*3/uL (ref 150–400)
RBC: 2.98 MIL/uL — ABNORMAL LOW (ref 4.22–5.81)
RDW: 13.5 % (ref 11.5–15.5)
WBC: 5.7 10*3/uL (ref 4.0–10.5)
nRBC: 0 % (ref 0.0–0.2)

## 2020-08-12 MED ORDER — WARFARIN SODIUM 7.5 MG PO TABS
7.5000 mg | ORAL_TABLET | Freq: Once | ORAL | Status: AC
  Administered 2020-08-12: 7.5 mg via ORAL
  Filled 2020-08-12: qty 1

## 2020-08-12 NOTE — Progress Notes (Signed)
Family Medicine Teaching Service Daily Progress Note Intern Pager: [phone]  Patient name: Rodney [name] Medical record number: [id number] Date of birth: 06-16-1950 Age: 70 y.o. Gender: male  Primary Care Provider: [name], MD Consultants: vascular surgery Code Status: Full  Pt Overview and Major Events to Date:  4/7 admitted 4/11 arteriogram with right common iliac stenting 4/13 right superficial femoral artery to below-knee popliteal artery bypass 4/15 2u pRBC transfused 4/16 1u pRBC transfused  Assessment and Plan: Rodney [name] a 70 y.o.malewho presents with right foot swelling and erythema concerning for cellulitis and PAD. 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  Right foot cellulitis Tolerating oral antibiotics well thus far, wrapped in clean and  dry dressing this morning with notable bleeding or saturations.Plan for 10-day total course of antibiotics. Antibiotics renally dosed now given AKI. -vascular surgery following, appreciate continued involvement  - doxycycline (4/11-) - cephalexin (4/11-) - s/p IV vancomycin (4/7-11) - s/p IV cefepime (4/7-11) - elevate foot - wound care consulted with recommendations: Cut an Aquacel dressing Kellie Simmering [phone]) into a long strip. Weave between toes on right foot. Secure with a few turns of kerlex. Change daily.  PAD S/p stenting of the right common iliac4/11 andRLE bypass surgery 4/13. Pain adequately controlled with oral medications but limiting mobility. On statin. - vascular surgery following, appreciate continued involvement  - oxycodone-APAP 5-325 mg q4h prn - pentoxifylline   Acute blood loss anemia s/p 3 units pRBC, Hgb this morning 8.8. CT abdomen/pelvis demonstrated post-operative changes consistent with 4x3.3 cm hematoma noted in the right groin/upper thigh anterior  to the vessels, on exam no notable bleeding or drainage noted.  - continue monitor Hgb closely  -Transfusion threshold 8 - outpatient GI follow up   HTN Normotensive this morning at BP 116/65. - hold losartan 50 mg - home carvedilol 12.5 mg BID  T2DM CBG 138 this morning. Adequately controlled on current regimen. - 70/30 insulin 20 units AM, 10 units PM - hold metformin - monitor CBG  HFpEF Slightly hypervolemic on exam.  Weight over all stable. - hold furosemide 40 mg daily given AKI - strict I/O -daily weights  Persistent A Fib In NSR. - warfarin per pharmacy - monitor INR - amiodarone 200 mg daily  AKI superimposed on CKD IIIa Stable. Cr 2.22 Suspect prerenal in the setting of hypovolemia from acute blood loss. - avoid nephrotoxic medications - holding ARB, furosemide - renally adjusting medications - monitor BMP  CAD On secondary prevention. Home meds include aspirin. - continue atorvastatin 80 mg daily - aspirin discontinued per vascular surgery  FEN/GI: carb modified  PPx: warfarin    Status is: Inpatient   Dispo: The patient is from: Home              Anticipated d/c is to: Home or SNF depending on ability to ambulate               Patient currently is not medically stable to d/c.   Difficult to place patient No        Subjective:  No acute overnight events reported. Patient denies any pain. Updated patient on plan including stable hemoglobin. He inquires about discharge as he is ready to go home. I explained that we want to ensure that he is able to ambulate appropriately in order to return home to prevent falls and potential future hospitalizations. No other concerns at  this time.  Objective: Temp:  [97.9 F (36.6 C)-98.4 F (36.9 C)] 98.2 F (36.8 C) (04/18 0513) Pulse Rate:  [60-81] 81 (04/18 0513) Resp:  [13-20] 20 (04/18 0513) BP: (110-140)/(58-75) 116/65 (04/18 0513) SpO2:  [92 %-98 %] 98 % (04/18 0513) Weight:  [111.5 kg]  111.5 kg (04/18 0500) Physical Exam: General: Patient sitting upright in bed, in no acute distress. Cardiovascular: 3/6 systolic murmur heard best at left sternal border, RRR Respiratory: CTAB, no rales or rhonchi noted, breathing comfortably on room air Abdomen: soft, nontender, BS+ Extremities: 1+ pitting edema noted right LE, s/p left BKA, incision on right LE appears stable with surrounding erythema Psych: mood appropriate   Physical exam performed in the presence of chaperone.   Laboratory: Recent Labs  Lab 08/10/20 0108 08/10/20 1101 08/11/20 0139 08/12/20 0233  WBC 8.9  --  7.4 5.7  HGB 7.7* 8.7* 8.3* 8.8*  HCT 23.3* 27.0* 25.5* 27.4*  PLT 236  --  252 271   Recent Labs  Lab 08/10/20 0108 08/11/20 0139 08/12/20 0233  NA 133* 133* 132*  K 4.1 4.0 4.4  CL 99 101 103  CO2 26 26 23   BUN 46* 52* 57*  CREATININE 2.64* 2.25* 2.22*  CALCIUM 8.3* 8.3* 8.2*  GLUCOSE 125* 135* 109*      Imaging/Diagnostic Tests: No results found.  Donney Dice, DO 08/12/2020, 6:34 AM PGY-1, [place] Intern pager: [phone], text pages welcome

## 2020-08-12 NOTE — Progress Notes (Signed)
Physical Therapy Treatment Patient Details Name: Rodney [name] MRN: [id number] DOB: May 01, 1950 Today's Date: 08/12/2020    History of Present Illness 70 y.o. male presenting with worsening erythema and swelling of right foot, concerning for cellulitis. 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 S/p right femoral to below-knee popliteal artery bypass on 08/07/20.    PT Comments    Pt is slower to improve toward goals.  He feels little need to push his mobility at this time.  Emphasis on transitions to EOB,  Balance while pt donned L prosthesis, sit to stand, short distance amb with RW and then scoot transfer chair back to bed.    Follow Up Recommendations  Home health PT;Other (comment);Supervision/Assistance - 24 hour     Equipment Recommendations  None recommended by PT    Recommendations for Other Services       Precautions / Restrictions Precautions Precautions: Fall    Mobility  Bed Mobility Overal bed mobility: Needs Assistance Bed Mobility: Supine to Sit;Sit to Supine     Supine to sit: Min assist Sit to supine: Min assist   General bed mobility comments: assist forward and initiating upright with min assist then can scoot to EOB without assist.    Transfers Overall transfer level: Needs assistance Equipment used: Rolling walker (2 wheeled) Transfers: Sit to/from Stand;Lateral/Scoot Transfers Sit to Stand: Max assist;From elevated surface        Lateral/Scoot Transfers: Min guard General transfer comment: cues for hand placement during stand.  Pt returned from chair back to bed with similar height scoot transfer from L to R.  Ambulation/Gait Ambulation/Gait assistance: Min assist Gait Distance (Feet): 6 Feet Assistive device: Rolling walker (2 wheeled) Gait Pattern/deviations: Step-to  pattern   Gait velocity interpretation: <1.31 ft/sec, indicative of household ambulator General Gait Details: antalgic and weak step to pattern.   Stairs             Wheelchair Mobility    Modified Rankin (Stroke Patients Only)       Balance Overall balance assessment: Needs assistance Sitting-balance support: No upper extremity supported Sitting balance-Leahy Scale: Fair Sitting balance - Comments: today sat EOB and donned the prosthesis without assist                                    Cognition Arousal/Alertness: Awake/alert Behavior During Therapy: WFL for tasks assessed/performed Overall Cognitive Status: Within Functional Limits for tasks assessed                                        Exercises      General Comments General comments (skin integrity, edema, etc.): Questioned pt/wife to find out if they had everything needed to stay at a w/c level immediately after d/c and did they feel like they could manage at w/c level initially until pt can tolerate the level of pain in w/bearing.  They both agreed they could manage when able to attain d/c.      Pertinent Vitals/Pain Pain Assessment: Faces Faces Pain Scale: Hurts even more Pain Location: R LE in standing. Pain Descriptors / Indicators: Grimacing;Guarding Pain Intervention(s): Monitored during session;Premedicated before session    Home Living  Prior Function            PT Goals (current goals can now be found in the care plan section) Acute Rehab PT Goals Patient Stated Goal: reduce pain, return home PT Goal Formulation: With patient Time For Goal Achievement: 08/15/20 Potential to Achieve Goals: Good Progress towards PT goals: Progressing toward goals    Frequency    Min 3X/week      PT Plan Current plan remains appropriate    Co-evaluation              AM-PAC PT "6 Clicks" Mobility   Outcome Measure  Help needed  turning from your back to your side while in a flat bed without using bedrails?: A Little Help needed moving from lying on your back to sitting on the side of a flat bed without using bedrails?: A Little   Help needed standing up from a chair using your arms (e.g., wheelchair or bedside chair)?: A Lot Help needed to walk in hospital room?: A Little Help needed climbing 3-5 steps with a railing? : A Lot 6 Click Score: 13    End of Session   Activity Tolerance: Patient limited by pain Patient left: in bed;with call bell/phone within reach;with bed alarm set;with family/visitor present Nurse Communication: Mobility status PT Visit Diagnosis: Other abnormalities of gait and mobility (R26.89);Pain;Muscle weakness (generalized) (M62.81) Pain - Right/Left: Right Pain - part of body: Leg     Time: 1410-1430 PT Time Calculation (min) (ACUTE ONLY): 20 min  Charges:  $Therapeutic Activity: 8-22 mins                     08/12/2020  Ginger Carne., PT Acute Rehabilitation Services [phone]  (pager) [phone]  (office)   Tessie Fass Denea Cheaney 08/12/2020, 4:32 PM

## 2020-08-12 NOTE — Progress Notes (Addendum)
Progress Note  VASCULAR SURGERY ASSESSMENT & PLAN:   POD 5 RIGHT FEMORAL POPLITEAL BYPASS WITH VEIN: His bypass is patent.  He has brisk Doppler signals in the right foot.  The wounds are improving.  VASCULAR QUALITY INITIATIVE: He is on a statin.  He is not on aspirin because he is on Coumadin.  He is on Plavix.  From a vascular standpoint, he would be ready for discharge as soon as he is more mobile.  Unfortunately, he has not been very cooperative with physical therapy.  Deitra Mayo, MD Office: [phone]   08/12/2020 7:02 AM 5 Days Post-Op  Subjective:  Says he hasn't gotten up much because of pain.  Says if he hurts today, he's not gonna do much  Afebrile HR 50's-60's NSR 124'P-809'X systolic 83% RA  Vitals:   08/12/20 0305 08/12/20 0513  BP: (!) 110/58 116/65  Pulse: 64 81  Resp: 18 20  Temp: 98.2 F (36.8 C) 98.2 F (36.8 C)  SpO2: 95% 98%    Physical Exam: Cardiac:  regular Lungs:  Non labored Incisions:  Incisions look good Extremities:  Brisk right DP/PT doppler signals      CBC    Component Value Date/Time   WBC 5.7 08/12/2020 0233   RBC 2.98 (L) 08/12/2020 0233   HGB 8.8 (L) 08/12/2020 0233   HGB 12.4 (L) 02/15/2020 0912   HCT 27.4 (L) 08/12/2020 0233   HCT 36.8 (L) 02/15/2020 0912   PLT 271 08/12/2020 0233   PLT 281 02/15/2020 0912   MCV 91.9 08/12/2020 0233   MCV 91 02/15/2020 0912   MCH 29.5 08/12/2020 0233   MCHC 32.1 08/12/2020 0233   RDW 13.5 08/12/2020 0233   RDW 12.2 02/15/2020 0912   LYMPHSABS 1.3 04/03/2020 0956   MONOABS 0.3 04/03/2020 0956   EOSABS 0.0 04/03/2020 0956   BASOSABS 0.1 04/03/2020 0956    BMET    Component Value Date/Time   NA 132 (L) 08/12/2020 0233   NA 136 07/30/2020 1357   K 4.4 08/12/2020 0233   CL 103 08/12/2020 0233   CO2 23 08/12/2020 0233   GLUCOSE 109 (H) 08/12/2020 0233   BUN 57 (H) 08/12/2020 0233   BUN 40 (H) 07/30/2020 1357   CREATININE 2.22 (H) 08/12/2020 0233   CREATININE 1.19  10/09/2015 1554   CALCIUM 8.2 (L) 08/12/2020 0233   GFRNONAA 31 (L) 08/12/2020 0233   GFRNONAA 65 10/09/2015 1554   GFRAA 52 (L) 05/01/2020 1632   GFRAA 75 10/09/2015 1554    INR    Component Value Date/Time   INR 1.7 (H) 08/12/2020 0233     Intake/Output Summary (Last 24 hours) at 08/12/2020 3825 Last data filed at 08/12/2020 0539 Gross per 24 hour  Intake 600 ml  Output 1300 ml  Net -700 ml     Assessment:  70 y.o. male is s/p:  Right superficial femoral artery to below-knee popliteal artery bypass with ipsilateral, translocated, nonreversed greater saphenous vein  5 Days Post-Op   Plan: -brisk doppler signals right foot -dressing removed and right great toe appears to be improving -discussed that pt needs to be getting out of bed.  Discussed with him that he would not be able to go home unless he can get out of bed and mobilize to bathroom.  Encourage pt to get OOB to chair.  May need SNF if unable to mobilize. -discussed groin wound care-needs to be dry unless in shower.  Discussed putting dry gauze in groin (no tape)  when sitting to wick moisture to help prevent wound infection. -doing well from vascular standpoint.   Leontine Locket, PA-C [facility] [phone] 08/12/2020 7:02 AM

## 2020-08-12 NOTE — Progress Notes (Signed)
ANTICOAGULATION CONSULT NOTE - Follow-up Consult  Pharmacy Consult for warfarin dosing Indication: atrial fibrillation  No Known Allergies  Patient Measurements: Height: 6' (182.9 cm) Weight: 111.5 kg (245 lb 13 oz) IBW/kg (Calculated) : 77.6  Vital Signs: Temp: 98.4 F (36.9 C) (04/18 0850) Temp Source: Oral (04/18 0850) BP: 124/66 (04/18 0850) Pulse Rate: 62 (04/18 0850)  Labs: Recent Labs    08/10/20 0108 08/10/20 1101 08/11/20 0139 08/12/20 0233  HGB 7.7* 8.7* 8.3* 8.8*  HCT 23.3* 27.0* 25.5* 27.4*  PLT 236  --  252 271  LABPROT 17.4*  --  20.8* 19.6*  INR 1.4*  --  1.8* 1.7*  CREATININE 2.64*  --  2.25* 2.22*    Estimated Creatinine Clearance: 41.1 mL/min (A) (by C-G formula based on SCr of 2.22 mg/dL (H)).   Medical History: Past Medical History:  Diagnosis Date  . CHF (congestive heart failure) ([place])   . Coronary artery disease   . Diabetes mellitus without complication ([place])   . HLD (hyperlipidemia)   . Hypertension   . Peripheral edema 02/24/2018  . SOB (shortness of breath) 07/26/2018    Assessment: 70 y/o M on warfarin 5 mg daily except 7.5 mg MWF PTA for Afib. Warfarin held 4/10 to 4/14 and vitamin K 5 mg x1 given in anticipation of R common femoral to below-knee popliteal bypass on 4/13. No further procedures planned, Warfarin was restarted 4/14 with enoxaparin bridge. Hgb decreased from 8.9 to 7.1 on 4/15 requiring pRBC transfusion. Enoxaparin was stopped and warfarin continued alone. Warfarin held again on 4/16 for acute drop in Hgb, pRBCs given. CT abdomen w/ right groin hematoma but no overt bleeding noted, VVS OK with resuming AC. Warfarin 7.5 mg given 4/17.  INR still subtherapeutic today at 1.7. Hgb improved to 8.8 s/p pRBCs x3, Plts stable at 271.  Goal of Therapy:  INR 2-3 Monitor platelets by anticoagulation protocol: Yes   Plan:  Warfarin 7.5 mg today, continue higher doses until INR therapeutic  Monitor daily INR, CBC &  signs/symptoms of bleeding   Benna Dunks  PharmD Candidate, Class of 2022

## 2020-08-12 NOTE — Plan of Care (Signed)
Pt refusing to participate in plan of care.   Raelyn Number, RN   Problem: Education: Goal: Knowledge of General Education information will improve Description: Including pain rating scale, medication(s)/side effects and non-pharmacologic comfort measures Outcome: Not Progressing   Problem: Education: Goal: Knowledge of General Education information will improve Description: Including pain rating scale, medication(s)/side effects and non-pharmacologic comfort measures Outcome: Not Progressing   Problem: Health Behavior/Discharge Planning: Goal: Ability to manage health-related needs will improve Outcome: Not Progressing   Problem: Clinical Measurements: Goal: Ability to maintain clinical measurements within normal limits will improve Outcome: Not Progressing Goal: Will remain free from infection Outcome: Not Progressing Goal: Diagnostic test results will improve Outcome: Not Progressing Goal: Respiratory complications will improve Outcome: Not Progressing Goal: Cardiovascular complication will be avoided Outcome: Not Progressing   Problem: Activity: Goal: Risk for activity intolerance will decrease Outcome: Not Progressing   Problem: Nutrition: Goal: Adequate nutrition will be maintained Outcome: Not Progressing   Problem: Coping: Goal: Level of anxiety will decrease Outcome: Not Progressing   Problem: Elimination: Goal: Will not experience complications related to bowel motility Outcome: Not Progressing Goal: Will not experience complications related to urinary retention Outcome: Not Progressing   Problem: Pain Managment: Goal: General experience of comfort will improve Outcome: Not Progressing   Problem: Safety: Goal: Ability to remain free from injury will improve Outcome: Not Progressing   Problem: Skin Integrity: Goal: Risk for impaired skin integrity will decrease Outcome: Not Progressing

## 2020-08-13 DIAGNOSIS — I4819 Other persistent atrial fibrillation: Secondary | ICD-10-CM | POA: Diagnosis not present

## 2020-08-13 DIAGNOSIS — L03119 Cellulitis of unspecified part of limb: Secondary | ICD-10-CM | POA: Diagnosis not present

## 2020-08-13 LAB — CBC
HCT: 26.5 % — ABNORMAL LOW (ref 39.0–52.0)
Hemoglobin: 8.5 g/dL — ABNORMAL LOW (ref 13.0–17.0)
MCH: 28.8 pg (ref 26.0–34.0)
MCHC: 32.1 g/dL (ref 30.0–36.0)
MCV: 89.8 fL (ref 80.0–100.0)
Platelets: 300 10*3/uL (ref 150–400)
RBC: 2.95 MIL/uL — ABNORMAL LOW (ref 4.22–5.81)
RDW: 13.2 % (ref 11.5–15.5)
WBC: 5.7 10*3/uL (ref 4.0–10.5)
nRBC: 0 % (ref 0.0–0.2)

## 2020-08-13 LAB — BASIC METABOLIC PANEL
Anion gap: 4 — ABNORMAL LOW (ref 5–15)
BUN: 55 mg/dL — ABNORMAL HIGH (ref 8–23)
CO2: 27 mmol/L (ref 22–32)
Calcium: 8.4 mg/dL — ABNORMAL LOW (ref 8.9–10.3)
Chloride: 102 mmol/L (ref 98–111)
Creatinine, Ser: 2.05 mg/dL — ABNORMAL HIGH (ref 0.61–1.24)
GFR, Estimated: 35 mL/min — ABNORMAL LOW (ref >60)
Glucose, Bld: 113 mg/dL — ABNORMAL HIGH (ref 70–99)
Potassium: 4.5 mmol/L (ref 3.5–5.1)
Sodium: 133 mmol/L — ABNORMAL LOW (ref 135–145)

## 2020-08-13 LAB — GLUCOSE, CAPILLARY
Glucose-Capillary: 133 mg/dL — ABNORMAL HIGH (ref 70–99)
Glucose-Capillary: 119 mg/dL — ABNORMAL HIGH (ref 70–99)

## 2020-08-13 LAB — PROTIME-INR
INR: 2.1 — ABNORMAL HIGH (ref 0.8–1.2)
Prothrombin Time: 23.2 seconds — ABNORMAL HIGH (ref 11.4–15.2)

## 2020-08-13 MED ORDER — CLOPIDOGREL BISULFATE 75 MG PO TABS: 75.0000 mg | ORAL_TABLET | Freq: Every day | ORAL | 0 refills | Status: DC

## 2020-08-13 NOTE — Discharge Summary (Signed)
[facility] Discharge Summary  Patient name: Rodney [name] Medical record number: [id number] Date of birth: 1950/08/01 Age: 70 y.o. Gender: male Date of Admission: 08/01/2020  Date of Discharge: 08/13/2020 Admitting Physician: [name], MD  Primary Care Provider: [name], MD Consultants: vascular surgery   Indication for Hospitalization: right foot swelling and pain   Discharge Diagnoses/Problem List:  Right foot cellulitis  AKI with CKD stage 3a PAD  CAD Anemia Hypertension Atrial fibrillation  Type 2 DM HFpEF  Disposition: home with [facility] PT  Discharge Condition: medically stable   Discharge Exam:  Temp:  [98 F (36.7 C)-98.8 F (37.1 C)] 98.3 F (36.8 C) (04/19 0423) Pulse Rate:  [58-82] 82 (04/19 0423) Resp:  [15-20] 15 (04/19 0423) BP: (117-124)/(59-73) 117/65 (04/19 0423) SpO2:  [93 %-97 %] 96 % (04/19 0423) Weight:  [110.8 kg] 110.8 kg (04/19 0440) Physical Exam: General: Patient sitting upright in bed, in no acute distress. Cardiovascular: 3/6 systolic murmur best heard at the left sternal border Respiratory: CTAB, breathing comfortably on room air Abdomen: soft, nontender, BS+ Extremities: radial and distal pulses present, mild right LE edema noted, s/p left BKA, incision on right LE stable with not worsening surrounding erythema Psych: mood appropriate   Brief Hospital Course:  Rodney [name] is a 70 y.o. male who presented with right foot swelling and erythema found to have right lower critical limb ischemia. 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  PAD with critical limb ischemia and cellulitis of the right lower extremity Patient was admitted from clinic with concern for cellulitis and was started on IV antibiotics with vancomycin and cefepime given history of  necrotizing fasciitis s/p left BKA in 2021.  Patient was afebrile with stable vitals.  MRI of the foot obtained without evidence of osteomyelitis or septic arthritis.  ABI obtained revealed moderate right lower extremity arterial disease.  Patient underwent arteriography on 4/11 with findings of high-grade stenosis at the right common iliac which was stented, as well as occluded above-knee popliteal artery not amenable to endovascular revascularization.  He subsequently underwent right superficial femoral artery to below-knee popliteal artery bypass procedure on 4/13.  IV antibiotics were transitioned to oral doxycycline (to cover for MRSA given that blood cultures were not obtained prior to antibiotics) and cephalexin after 5 days of IV antibiotics and he was treated for a total of 10 days of antibiotics.  Prior to discharge, pain was adequately controlled on oral pain medications and patient was able to ambulate. He was evaluated by PT who recommended home health which was ordered prior to discharge.   Acute blood loss anemia On postop day 2 following right lower extremity bypass surgery, patient's hemoglobin began to drop and ultimately required 3 units pRBC to maintain a transfusion threshold of 8 given PAD.  Warfarin had been restarted following the surgery but was stopped given need for multiple transfusions.  Patient had no clinical signs of GI bleeding and had a negative FOBT.  CT scan of the abdomen without contrast revealed 4x3.3 cm hematoma noted in the right groin/upper thigh anterior to the vessels but without notable bleeding or drainage. Patient hemoglobin was stable at 8.5 at discharge.  AKI superimposed on CKD IIIa Kidney function remained stable throughout admission until postop day 2 following right lower extremity bypass surgery, noted to have an increase in his serum creatinine which correlated with  a timeframe of acute blood loss anemia.  Losartan and furosemide were held during  hospitalization and also held on discharge given persistently elevated creatinine. Serum creatinine on day of discharge 2.05 with repeat labs scheduled for outpatient follow up.   Atrial fibrillation Patient's warfarin was held prior to his arteriography and right lower extremity bypass procedure.  Warfarin was restarted with LMWH to bridge following his right lower extremity bypass procedure; however, these were discontinued when his hemoglobin levels dropped requiring multiple blood transfusions.  Warfarin was restarted once his hemoglobin level stabilized with INR at goal.   T2DM Patient's blood glucose levels were well controlled on reduced dosing of his home 70/30 insulin regimen.  He was on 30 units in the morning, 15 units in the evening prior to admission which was reduced to 20 units in the morning, 10 units in the evening.  He was discharged back on his home regimen along with his home metformin.  All other issues chronic and stable.  Issues for follow up:   1. Scheduled at [facility] on 4/22 at 1:50 pm. 2. GI outpatient regarding hematoma and declining hemoglobin, due for colonoscopy.  3. Will need CBC, INR and BMP at hospital follow up appt.  4. Pentoxifylline discontinued on discharge, reassess in case this needs to be restarted. 5. Home warfarin restarted, recheck INR at next visit with goal 2-3.  6. Held ARB, spironolactone and furosemide given elevated creatinine 7. Patient discharged with home health PT, ensure he is receiving these services.    Significant Procedures:  4/11: Stenting of right common iliac  4/13: Right femoral to below knee popliteal artery bypass   Significant Labs and Imaging:  Recent Labs  Lab 08/11/20 0139 08/12/20 0233 08/13/20 0050  WBC 7.4 5.7 5.7  HGB 8.3* 8.8* 8.5*  HCT 25.5* 27.4* 26.5*  PLT 252 271 300   Recent Labs  Lab 08/09/20 0732 08/10/20 0108 08/11/20 0139 08/12/20 0233 08/13/20 0050  NA 128* 133* 133* 132* 133*  K 3.9 4.1 4.0 4.4  4.5  CL 98 99 101 103 102  CO2 24 26 26 23 27   GLUCOSE 132* 125* 135* 109* 113*  BUN 39* 46* 52* 57* 55*  CREATININE 2.45* 2.64* 2.25* 2.22* 2.05*  CALCIUM 8.2* 8.3* 8.3* 8.2* 8.4*     Results/Tests Pending at Time of Discharge:  none  Discharge Medications:  Allergies as of 08/13/2020   No Known Allergies     Medication List    STOP taking these medications   aspirin EC 81 MG tablet   furosemide 20 MG tablet Commonly known as: LASIX   furosemide 40 MG tablet Commonly known as: LASIX   losartan 25 MG tablet Commonly known as: COZAAR   spironolactone 25 MG tablet Commonly known as: ALDACTONE     TAKE these medications   amiodarone 200 MG tablet Commonly known as: PACERONE Take 1 tablet (200 mg total) by mouth daily.   atorvastatin 80 MG tablet Commonly known as: LIPITOR TAKE 1 TABLET EVERY DAY   carvedilol 12.5 MG tablet Commonly known as: COREG Take 1 tablet (12.5 mg total) by mouth 2 (two) times daily with a meal. What changed: when to take this   clopidogrel 75 MG tablet Commonly known as: PLAVIX Take 1 tablet (75 mg total) by mouth daily. Start taking on: August 14, 2020   metFORMIN 1000 MG tablet Commonly known as: GLUCOPHAGE Take 0.5 tablets (500 mg total) by mouth 2 (two) times daily with a meal. What changed:  how much to take  when to take this   multivitamin Tabs tablet Take 1 tablet by mouth at bedtime.   nicotine 7 mg/24hr patch Commonly known as: NICODERM CQ - dosed in mg/24 hr Place 7 mg onto the skin daily.   NovoLIN 70/30 ReliOn (70-30) 100 UNIT/ML injection Generic drug: insulin NPH-regular Human Inject 15-30 Units into the skin See admin instructions. Inject 30 units into the skin with breakfast and 15 units with supper- "until eventually replaced by Lantus"   ReliOn True Metrix Test Strips test strip Generic drug: glucose blood Use to test blood sugar three times per day.   True Metrix Meter Devi Use to test blood sugar  three times daily.   True Metrix Meter w/Device Kit USE AS DIRECTED   TRUEplus Lancets 33G Misc Use to test blood sugar three times per day.   warfarin 5 MG tablet Commonly known as: COUMADIN Take as directed. If you are unsure how to take this medication, talk to your nurse or doctor. Original instructions: Take 1 tablet by mouth once daily What changed:   how much to take  when to take this  additional instructions       Discharge Instructions: Please refer to Patient Instructions section of EMR for full details.  Patient was counseled important signs and symptoms that should prompt return to medical care, changes in medications, dietary instructions, activity restrictions, and follow up appointments.   Follow-Up Appointments:  Follow-up Information    [facility] Follow up.   Specialty: [place] Why: HHPT arranged- they will contact you to set up home visits (formerly known as [facility]) Contact information: [place] [address] [phone]        [name], MD .   Specialty: [facility] Contact information: [place] [address] [phone]        Geralynn Rile, MD .   Specialties: Cardiology, Internal Medicine, Radiology Contact information: [place] [address] [phone]        [facility]. Go on 08/16/2020.   Why: You have an appointment scheduled for 4/22 at 1:50 pm, please arrive at least 15 minutes earlier than your scheduled appointment time. Contact information: [place]              Donney Dice, DO 08/13/2020, 3:54 PM PGY-1, [place]

## 2020-08-13 NOTE — Care Management Important Message (Signed)
Important Message  Patient Details  Name: Rodney [name] MRN: [id number] Date of Birth: 10/31/1950   Medicare Important Message Given:  Yes     Shelda Altes 08/13/2020, 12:19 PM

## 2020-08-13 NOTE — Discharge Instructions (Signed)
You were hospitalized at [facility] due to right foot pain.  We expect this is from cellulitis which improved after antibiotic treatment and wound care We are so glad you are feeling better.  Be sure to follow-up with your regularly scheduled appointments.  Please also be sure to follow-up with our clinic on 4/22 at 1:50 pm.  Thank you for allowing [place] to be a part of your medical care.  Take care, [facility] family medicine team

## 2020-08-13 NOTE — Plan of Care (Signed)

## 2020-08-13 NOTE — Progress Notes (Addendum)
  Progress Note    08/13/2020 7:32 AM 6 Days Post-Op  Subjective:  No complaints; has his leg propped up and feels the swelling is a bit better.  Worked with PT yesterday.  Afebrile   Vitals:   08/12/20 2331 08/13/20 0423  BP: 124/68 117/65  Pulse: (!) 58 82  Resp: 17 15  Temp:  98.3 F (36.8 C)  SpO2: 93% 96%    Physical Exam: Cardiac:  regular Lungs:  Non labored Incisions:  Clean and dry Extremities:  + doppler signals right DP/PT   CBC    Component Value Date/Time   WBC 5.7 08/13/2020 0050   RBC 2.95 (L) 08/13/2020 0050   HGB 8.5 (L) 08/13/2020 0050   HGB 12.4 (L) 02/15/2020 0912   HCT 26.5 (L) 08/13/2020 0050   HCT 36.8 (L) 02/15/2020 0912   PLT 300 08/13/2020 0050   PLT 281 02/15/2020 0912   MCV 89.8 08/13/2020 0050   MCV 91 02/15/2020 0912   MCH 28.8 08/13/2020 0050   MCHC 32.1 08/13/2020 0050   RDW 13.2 08/13/2020 0050   RDW 12.2 02/15/2020 0912   LYMPHSABS 1.3 04/03/2020 0956   MONOABS 0.3 04/03/2020 0956   EOSABS 0.0 04/03/2020 0956   BASOSABS 0.1 04/03/2020 0956    BMET    Component Value Date/Time   NA 133 (L) 08/13/2020 0050   NA 136 07/30/2020 1357   K 4.5 08/13/2020 0050   CL 102 08/13/2020 0050   CO2 27 08/13/2020 0050   GLUCOSE 113 (H) 08/13/2020 0050   BUN 55 (H) 08/13/2020 0050   BUN 40 (H) 07/30/2020 1357   CREATININE 2.05 (H) 08/13/2020 0050   CREATININE 1.19 10/09/2015 1554   CALCIUM 8.4 (L) 08/13/2020 0050   GFRNONAA 35 (L) 08/13/2020 0050   GFRNONAA 65 10/09/2015 1554   GFRAA 52 (L) 05/01/2020 1632   GFRAA 75 10/09/2015 1554    INR    Component Value Date/Time   INR 2.1 (H) 08/13/2020 0050     Intake/Output Summary (Last 24 hours) at 08/13/2020 0732 Last data filed at 08/13/2020 9767 Gross per 24 hour  Intake --  Output 750 ml  Net -750 ml     Assessment:  70 y.o. male is s/p:  Right superficial femoral artery to below-knee popliteal artery bypass with ipsilateral, translocated, nonreversed greater  saphenous vein   6 Days Post-Op  Plan: -brisk doppler signals right foot -allow toe to demarcate but looks as though this should heal -worked with PT yesterday-recommended HHPT -dry gauze to groin     Leontine Locket, PA-C [facility] [phone] 08/13/2020 7:32 AM  I have interviewed the patient and examined the patient. I agree with the findings by the PA. Home when [place] arranged.   Gae Gallop, MD [phone]

## 2020-08-13 NOTE — Progress Notes (Signed)
Family Medicine Teaching Service Daily Progress Note Intern Pager: [phone]  Patient name: Rodney [name] Medical record number: [id number] Date of birth: 05-06-1950 Age: 70 y.o. Gender: male  Primary Care Provider: [name], MD Consultants: vascular surgery Code Status: Full  Pt Overview and Major Events to Date:  4/7 admitted 4/11 arteriogram with right common iliac stenting 4/13 right superficial femoral artery to below-knee popliteal artery bypass 4/15 2u pRBC transfused 4/16 1u pRBC transfused  Assessment and Plan: Rodney [name] a 70 y.o.malewho presents with right foot swelling and erythema concerning for cellulitis and PAD. 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  Right foot cellulitis Significantly improved. S/p vancomycin and cefepime (4/7-4/11). Completed course of antibiotics doxycycline and cephalexin.  -vascular surgery following, appreciate continued involvement  - elevate foot - wound careconsulted withrecommendations: Cut an Aquacel dressing Kellie Simmering [phone]) into a long strip. Weave between toes on right foot. Secure with a few turns of kerlex. Change daily.  PAD S/p stenting of the right common iliac4/11 andRLE bypass surgery 4/13. Pain adequately controlled with oral medications but limiting mobility. On statin. - vascular surgeryfollowing, appreciate continued involvement - oxycodone-APAP 5-325 mg q4h prn - pentoxifylline, will discontinue on discharge, to be reassessed in outpatient setting   Acute blood loss anemia s/p 3 units pRBC, Hgb this morning8.5.CT abdomen/pelvis demonstrated post-operative changes consistent with 4x3.3 cm hematoma noted in the right groin/upper thigh anterior to the vessels, on exam no notable bleeding or drainage noted. -Transfusion threshold 8 - outpatient GI follow up    HTN Normotensivethis morning at BP 117/65. - hold losartan 50 mg - home carvedilol 12.5 mg BID  T2DM CBG 133 this morning.Adequately controlled on current regimen. - 70/30 insulin 20 units AM, 10 units PM - hold metformin - monitor CBG  HFpEF Weight over all stable. - hold furosemide 40 mg daily given AKI - strict I/O -daily weights  Persistent A Fib In NSR. - warfarin per pharmacy - monitor INR - amiodarone 200 mg daily  AKI superimposed on CKD IIIa Stable. Cr 2.05Suspect prerenal in the setting of hypovolemia from acute blood loss. - avoid nephrotoxic medications - holding ARB, furosemide - renally adjusting medications - monitor BMP  CAD On secondary prevention. Home meds include aspirin. - continue atorvastatin 80 mg daily - aspirin discontinued per vascular surgery  FEN/GI: carb modified  PPx: warfarin   Status is: Inpatient   Dispo: The patient is from: Home              Anticipated d/c is to: Home with [facility] PT              Patient currently is not medically stable to d/c.   Difficult to place patient No        Subjective:  No acute overnight events reported. Denies any pain or concerns. Reports that vascular surgery saw him earlier today. Patient is eager to go home today.   Objective: Temp:  [98 F (36.7 C)-98.8 F (37.1 C)] 98.3 F (36.8 C) (04/19 0423) Pulse Rate:  [58-82] 82 (04/19 0423) Resp:  [15-20] 15 (04/19 0423) BP: (117-124)/(59-73) 117/65 (04/19 0423) SpO2:  [93 %-97 %] 96 % (04/19 0423) Weight:  [110.8 kg] 110.8 kg (04/19 0440) Physical Exam: General: Patient sitting upright in bed, in no acute distress. Cardiovascular: 3/6 systolic murmur best heard at the left sternal border Respiratory: CTAB, breathing comfortably on room  air Abdomen: soft, nontender, BS+ Extremities: radial and distal pulses present, mild right LE edema noted, s/p left BKA, incision on right LE stable with not worsening surrounding  erythema Psych: mood appropriate   Laboratory: Recent Labs  Lab 08/11/20 0139 08/12/20 0233 08/13/20 0050  WBC 7.4 5.7 5.7  HGB 8.3* 8.8* 8.5*  HCT 25.5* 27.4* 26.5*  PLT 252 271 300   Recent Labs  Lab 08/11/20 0139 08/12/20 0233 08/13/20 0050  NA 133* 132* 133*  K 4.0 4.4 4.5  CL 101 103 102  CO2 26 23 27   BUN 52* 57* 55*  CREATININE 2.25* 2.22* 2.05*  CALCIUM 8.3* 8.2* 8.4*  GLUCOSE 135* 109* 113*      Imaging/Diagnostic Tests: No results found.  Donney Dice, DO 08/13/2020, 6:58 AM PGY-1, [place] Intern pager: [phone], text pages welcome

## 2020-08-13 NOTE — Progress Notes (Signed)
Patient d/c from unit. Discharge instructions given and education completed.    

## 2020-08-13 NOTE — Progress Notes (Signed)
Occupational Therapy Treatment Patient Details Name: Rodney [name] MRN: [id number] DOB: 04-May-1950 Today's Date: 08/13/2020    History of present illness 70 y.o. male presenting with worsening erythema and swelling of right foot, concerning for cellulitis. 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 S/p right femoral to below-knee popliteal artery bypass on 08/07/20.   OT comments  Pt did not want to get OOB or to preform ADLs. Pt. Was agreeable to b UE HEP. Pt. Given blue theraband and instructed on B chest press,shld flex,  overhead press, bicep, and tricep. Pt. Was able to return demo exercises correctly. Acute OT to follow.   Follow Up Recommendations  Home health OT;Supervision - Intermittent    Equipment Recommendations  None recommended by OT    Recommendations for Other Services      Precautions / Restrictions Precautions Precautions: Fall       Mobility Bed Mobility Overal bed mobility: Needs Assistance Bed Mobility: Supine to Sit;Sit to Supine     Supine to sit: Min assist Sit to supine: Min assist        Transfers                 General transfer comment: Pt. did not want to get OOB    Balance                                           ADL either performed or assessed with clinical judgement   ADL                                               Vision       Perception     Praxis      Cognition Arousal/Alertness: Awake/alert Behavior During Therapy: WFL for tasks assessed/performed Overall Cognitive Status: Within Functional Limits for tasks assessed                                          Exercises     Shoulder Instructions       General Comments      Pertinent Vitals/ Pain       Pain Assessment: 0-10 Pain  Score: 4  Pain Location: R LE Pain Descriptors / Indicators: Aching Pain Intervention(s): Limited activity within patient's tolerance  Home Living                                          Prior Functioning/Environment              Frequency  Min 2X/week        Progress Toward Goals  OT Goals(current goals can now be found in the care plan section)  Progress towards OT goals: Progressing toward goals  Acute Rehab OT Goals Patient Stated Goal: go home today OT Goal Formulation: With patient Time For Goal Achievement: 08/22/20 Potential to Achieve Goals: Good ADL Goals Pt Will Perform Lower Body Bathing: with modified  independence;sit to/from stand;sitting/lateral leans Pt Will Perform Lower Body Dressing: with modified independence;sitting/lateral leans;sit to/from stand Pt Will Transfer to Toilet: with modified independence;stand pivot transfer;squat pivot transfer;bedside commode Pt Will Perform Toileting - Clothing Manipulation and hygiene: with modified independence;sitting/lateral leans;sit to/from stand Additional ADL Goal #1: Pt will complete bed mobility at mod I level to prepare for EOB/OOB ADLs.  Plan Discharge plan remains appropriate    Co-evaluation                 AM-PAC OT "6 Clicks" Daily Activity     Outcome Measure   Help from another person eating meals?: None Help from another person taking care of personal grooming?: None Help from another person toileting, which includes using toliet, bedpan, or urinal?: A Little Help from another person bathing (including washing, rinsing, drying)?: A Little Help from another person to put on and taking off regular upper body clothing?: None Help from another person to put on and taking off regular lower body clothing?: A Little 6 Click Score: 21    End of Session    OT Visit Diagnosis: Other abnormalities of gait and mobility (R26.89);Muscle weakness (generalized)  (M62.81);Pain Pain - Right/Left: Right Pain - part of body: Leg   Activity Tolerance Patient limited by pain   Patient Left in bed;with call bell/phone within reach;Other (comment)   Nurse Communication  (ok therapy)        Time: 6599-3570 OT Time Calculation (min): 31 min  Charges: OT General Charges $OT Visit: 1 Visit OT Treatments $Therapeutic Exercise: 23-37 mins  Reece Packer OT/L    [place] 08/13/2020, 12:44 PM

## 2020-08-13 NOTE — TOC Transition Note (Signed)
Transition of Care (TOC) - CM/SW Discharge Note [name] RN, BSN Transitions of Care Unit 4E- RN Case Manager See Treatment Team for direct phone #   Patient Details  Name: Rodney [name] MRN: [id number] Date of Birth: 1951/04/10  Transition of Care [facility]) CM/SW Contact:  Dawayne Patricia, RN Phone Number: 08/13/2020, 10:01 AM   Clinical Narrative:    Order for HHPT has been placed, as pre previous conversation with pt - referral has been given to [facility] as per pt choice. Referral has been accepted for HHPT. Per pt he does not have any DME needs, and he has reported that his GF can assist with wound care needs.   No further TOC needs noted.    Final next level of care: [place] Barriers to Discharge: No Barriers Identified   Patient Goals and CMS Choice Patient states their goals for this hospitalization and ongoing recovery are:: to go home, would like [place] CMS Medicare.gov Compare Post Acute Care list provided to:: Patient Choice offered to / list presented to : Patient  Discharge Placement               Home with [facility]        Discharge Plan and Services   Discharge Planning Services: CM Consult Post Acute Care Choice: [place]          DME Arranged: N/A DME Agency: NA       HH Arranged: PT [place] Agency: [facility] (formerly [employer]) Date [place]: 08/08/20 Time Portal: 1627 Representative spoke with at [place]: [place] ([place]) Interventions     Readmission Risk Interventions Readmission Risk Prevention Plan 08/13/2020  Transportation Screening Complete  PCP or Specialist Appt within 3-5 Days Complete  HRI or [place] Complete  Social Work Consult for [place] Planning/Counseling Complete  Palliative Care Screening Not Applicable  Medication Review Press photographer) Complete  Some recent  data might be hidden

## 2020-08-13 NOTE — Progress Notes (Signed)
ANTICOAGULATION CONSULT NOTE - Follow-up Consult  Pharmacy Consult for warfarin dosing Indication: atrial fibrillation  No Known Allergies  Patient Measurements: Height: 6' (182.9 cm) Weight: 110.8 kg (244 lb 4.3 oz) IBW/kg (Calculated) : 77.6  Vital Signs: Temp: 98.5 F (36.9 C) (04/19 0746) Temp Source: Oral (04/19 0746) BP: 134/69 (04/19 0746) Pulse Rate: 62 (04/19 0746)  Labs: Recent Labs    08/11/20 0139 08/12/20 0233 08/13/20 0050  HGB 8.3* 8.8* 8.5*  HCT 25.5* 27.4* 26.5*  PLT 252 271 300  LABPROT 20.8* 19.6* 23.2*  INR 1.8* 1.7* 2.1*  CREATININE 2.25* 2.22* 2.05*    Estimated Creatinine Clearance: 44.3 mL/min (A) (by C-G formula based on SCr of 2.05 mg/dL (H)).   Medical History: Past Medical History:  Diagnosis Date  . CHF (congestive heart failure) ([place])   . Coronary artery disease   . Diabetes mellitus without complication ([place])   . HLD (hyperlipidemia)   . Hypertension   . Peripheral edema 02/24/2018  . SOB (shortness of breath) 07/26/2018    Assessment: 70 y/o M on warfarin 5 mg daily except 7.5 mg MWF PTA for Afib. Warfarin held 4/10 to 4/14  in anticipation of R common femoral to below-knee popliteal bypass on 4/13. Warfarin restarted 4/14 with enoxaparin bridge. Pt had acute drop in Hgb with R groin hematoma but no overt bleeding on 4/16, enoxaparin was discontinued and warfarin held. Warfarin was restarted 4/17 at 7.5 mg. No further procedures planned, possible discharge today.   INR therapeutic today at 2.1 after 3 doses of warfarin 7.5 mg. Hgb stable at 8.5, Plts stable at 300.   Goal of Therapy:  INR 2-3 Monitor platelets by anticoagulation protocol: Yes   Plan:  Administer warfarin 5 mg PO today, restarting PTA regimen Monitor daily INR, CBC until discharge  Monitor signs/symptoms of bleeding FM team to check INR and CBC at outpt follow-up planned for 4/22  [facility]  PharmD Candidate, Class of 2022

## 2020-08-14 ENCOUNTER — Encounter (HOSPITAL_COMMUNITY): Payer: Self-pay | Admitting: Family Medicine

## 2020-08-14 ENCOUNTER — Observation Stay (HOSPITAL_COMMUNITY): Payer: Medicare PPO

## 2020-08-14 ENCOUNTER — Inpatient Hospital Stay (HOSPITAL_COMMUNITY)
Admission: EM | Admit: 2020-08-14 | Discharge: 2020-08-19 | DRG: 300 | Disposition: A | Discharge status: Home Health | Payer: Medicare PPO | Attending: Family Medicine | Admitting: Family Medicine

## 2020-08-14 ENCOUNTER — Telehealth: Payer: Self-pay

## 2020-08-14 ENCOUNTER — Observation Stay (HOSPITAL_BASED_OUTPATIENT_CLINIC_OR_DEPARTMENT_OTHER): Payer: Medicare PPO

## 2020-08-14 ENCOUNTER — Other Ambulatory Visit: Payer: Self-pay

## 2020-08-14 DIAGNOSIS — N1831 Chronic kidney disease, stage 3a: Secondary | ICD-10-CM | POA: Diagnosis present

## 2020-08-14 DIAGNOSIS — L0390 Cellulitis, unspecified: Secondary | ICD-10-CM

## 2020-08-14 DIAGNOSIS — T8141XA Infection following a procedure, superficial incisional surgical site, initial encounter: Secondary | ICD-10-CM | POA: Diagnosis not present

## 2020-08-14 DIAGNOSIS — Z833 Family history of diabetes mellitus: Secondary | ICD-10-CM | POA: Diagnosis not present

## 2020-08-14 DIAGNOSIS — Z9889 Other specified postprocedural states: Secondary | ICD-10-CM

## 2020-08-14 DIAGNOSIS — E785 Hyperlipidemia, unspecified: Secondary | ICD-10-CM | POA: Diagnosis present

## 2020-08-14 DIAGNOSIS — Z89512 Acquired absence of left leg below knee: Secondary | ICD-10-CM | POA: Diagnosis not present

## 2020-08-14 DIAGNOSIS — I5042 Chronic combined systolic (congestive) and diastolic (congestive) heart failure: Secondary | ICD-10-CM | POA: Diagnosis present

## 2020-08-14 DIAGNOSIS — E1149 Type 2 diabetes mellitus with other diabetic neurological complication: Secondary | ICD-10-CM | POA: Diagnosis present

## 2020-08-14 DIAGNOSIS — Z20822 Contact with and (suspected) exposure to covid-19: Secondary | ICD-10-CM | POA: Diagnosis present

## 2020-08-14 DIAGNOSIS — Z7901 Long term (current) use of anticoagulants: Secondary | ICD-10-CM

## 2020-08-14 DIAGNOSIS — L03115 Cellulitis of right lower limb: Secondary | ICD-10-CM | POA: Diagnosis present

## 2020-08-14 DIAGNOSIS — Z7902 Long term (current) use of antithrombotics/antiplatelets: Secondary | ICD-10-CM | POA: Diagnosis not present

## 2020-08-14 DIAGNOSIS — D62 Acute posthemorrhagic anemia: Secondary | ICD-10-CM | POA: Diagnosis present

## 2020-08-14 DIAGNOSIS — J811 Chronic pulmonary edema: Secondary | ICD-10-CM | POA: Diagnosis not present

## 2020-08-14 DIAGNOSIS — F17210 Nicotine dependence, cigarettes, uncomplicated: Secondary | ICD-10-CM | POA: Diagnosis present

## 2020-08-14 DIAGNOSIS — L02415 Cutaneous abscess of right lower limb: Secondary | ICD-10-CM | POA: Diagnosis present

## 2020-08-14 DIAGNOSIS — Z7984 Long term (current) use of oral hypoglycemic drugs: Secondary | ICD-10-CM | POA: Diagnosis not present

## 2020-08-14 DIAGNOSIS — I82409 Acute embolism and thrombosis of unspecified deep veins of unspecified lower extremity: Secondary | ICD-10-CM

## 2020-08-14 DIAGNOSIS — E1122 Type 2 diabetes mellitus with diabetic chronic kidney disease: Secondary | ICD-10-CM | POA: Diagnosis present

## 2020-08-14 DIAGNOSIS — R609 Edema, unspecified: Secondary | ICD-10-CM

## 2020-08-14 DIAGNOSIS — L538 Other specified erythematous conditions: Secondary | ICD-10-CM | POA: Diagnosis not present

## 2020-08-14 DIAGNOSIS — I130 Hypertensive heart and chronic kidney disease with heart failure and stage 1 through stage 4 chronic kidney disease, or unspecified chronic kidney disease: Secondary | ICD-10-CM | POA: Diagnosis present

## 2020-08-14 DIAGNOSIS — Z794 Long term (current) use of insulin: Secondary | ICD-10-CM

## 2020-08-14 DIAGNOSIS — E871 Hypo-osmolality and hyponatremia: Secondary | ICD-10-CM | POA: Diagnosis present

## 2020-08-14 DIAGNOSIS — I82421 Acute embolism and thrombosis of right iliac vein: Secondary | ICD-10-CM | POA: Diagnosis present

## 2020-08-14 DIAGNOSIS — I2510 Atherosclerotic heart disease of native coronary artery without angina pectoris: Secondary | ICD-10-CM | POA: Diagnosis present

## 2020-08-14 DIAGNOSIS — I350 Nonrheumatic aortic (valve) stenosis: Secondary | ICD-10-CM | POA: Diagnosis present

## 2020-08-14 DIAGNOSIS — Z6833 Body mass index (BMI) 33.0-33.9, adult: Secondary | ICD-10-CM

## 2020-08-14 DIAGNOSIS — I739 Peripheral vascular disease, unspecified: Secondary | ICD-10-CM | POA: Diagnosis present

## 2020-08-14 DIAGNOSIS — M79606 Pain in leg, unspecified: Secondary | ICD-10-CM | POA: Diagnosis not present

## 2020-08-14 DIAGNOSIS — E1165 Type 2 diabetes mellitus with hyperglycemia: Secondary | ICD-10-CM

## 2020-08-14 DIAGNOSIS — Z811 Family history of alcohol abuse and dependence: Secondary | ICD-10-CM

## 2020-08-14 DIAGNOSIS — IMO0002 Reserved for concepts with insufficient information to code with codable children: Secondary | ICD-10-CM

## 2020-08-14 DIAGNOSIS — I10 Essential (primary) hypertension: Secondary | ICD-10-CM | POA: Diagnosis not present

## 2020-08-14 DIAGNOSIS — E669 Obesity, unspecified: Secondary | ICD-10-CM | POA: Diagnosis present

## 2020-08-14 DIAGNOSIS — I70221 Atherosclerosis of native arteries of extremities with rest pain, right leg: Secondary | ICD-10-CM | POA: Diagnosis present

## 2020-08-14 DIAGNOSIS — E1140 Type 2 diabetes mellitus with diabetic neuropathy, unspecified: Secondary | ICD-10-CM

## 2020-08-14 DIAGNOSIS — E7800 Pure hypercholesterolemia, unspecified: Secondary | ICD-10-CM | POA: Diagnosis present

## 2020-08-14 DIAGNOSIS — Z8249 Family history of ischemic heart disease and other diseases of the circulatory system: Secondary | ICD-10-CM | POA: Diagnosis not present

## 2020-08-14 DIAGNOSIS — N179 Acute kidney failure, unspecified: Secondary | ICD-10-CM | POA: Diagnosis not present

## 2020-08-14 DIAGNOSIS — I517 Cardiomegaly: Secondary | ICD-10-CM | POA: Diagnosis not present

## 2020-08-14 DIAGNOSIS — I82411 Acute embolism and thrombosis of right femoral vein: Principal | ICD-10-CM | POA: Diagnosis present

## 2020-08-14 DIAGNOSIS — R600 Localized edema: Secondary | ICD-10-CM | POA: Diagnosis not present

## 2020-08-14 DIAGNOSIS — I480 Paroxysmal atrial fibrillation: Secondary | ICD-10-CM | POA: Diagnosis present

## 2020-08-14 DIAGNOSIS — K5900 Constipation, unspecified: Secondary | ICD-10-CM | POA: Diagnosis present

## 2020-08-14 LAB — COMPREHENSIVE METABOLIC PANEL
ALT: 18 U/L (ref 0–44)
AST: 21 U/L (ref 15–41)
Albumin: 2.5 g/dL — ABNORMAL LOW (ref 3.5–5.0)
Alkaline Phosphatase: 75 U/L (ref 38–126)
Anion gap: 10 (ref 5–15)
BUN: 53 mg/dL — ABNORMAL HIGH (ref 8–23)
CO2: 24 mmol/L (ref 22–32)
Calcium: 8.6 mg/dL — ABNORMAL LOW (ref 8.9–10.3)
Chloride: 96 mmol/L — ABNORMAL LOW (ref 98–111)
Creatinine, Ser: 1.75 mg/dL — ABNORMAL HIGH (ref 0.61–1.24)
GFR, Estimated: 42 mL/min — ABNORMAL LOW (ref >60)
Glucose, Bld: 174 mg/dL — ABNORMAL HIGH (ref 70–99)
Potassium: 4.7 mmol/L (ref 3.5–5.1)
Sodium: 130 mmol/L — ABNORMAL LOW (ref 135–145)
Total Bilirubin: 1 mg/dL (ref 0.3–1.2)
Total Protein: 6.2 g/dL — ABNORMAL LOW (ref 6.5–8.1)

## 2020-08-14 LAB — GLUCOSE, CAPILLARY
Glucose-Capillary: 123 mg/dL — ABNORMAL HIGH (ref 70–99)
Glucose-Capillary: 169 mg/dL — ABNORMAL HIGH (ref 70–99)

## 2020-08-14 LAB — CBC
HCT: 30.5 % — ABNORMAL LOW (ref 39.0–52.0)
Hemoglobin: 9.7 g/dL — ABNORMAL LOW (ref 13.0–17.0)
MCH: 28.4 pg (ref 26.0–34.0)
MCHC: 31.8 g/dL (ref 30.0–36.0)
MCV: 89.2 fL (ref 80.0–100.0)
Platelets: 400 10*3/uL (ref 150–400)
RBC: 3.42 MIL/uL — ABNORMAL LOW (ref 4.22–5.81)
RDW: 13.2 % (ref 11.5–15.5)
WBC: 9.5 10*3/uL (ref 4.0–10.5)
nRBC: 0 % (ref 0.0–0.2)

## 2020-08-14 LAB — PROTIME-INR
INR: 3.4 — ABNORMAL HIGH (ref 0.8–1.2)
Prothrombin Time: 34.1 seconds — ABNORMAL HIGH (ref 11.4–15.2)

## 2020-08-14 LAB — SARS CORONAVIRUS 2 (TAT 6-24 HRS): SARS Coronavirus 2: NEGATIVE

## 2020-08-14 IMAGING — DX DG CHEST 1V PORT
1 series · 1 of 1 positions shown · non-contrast
Comparison: [DATE].

CLINICAL DATA: Edema.

EXAM:
PORTABLE CHEST 1 VIEW

[chest]
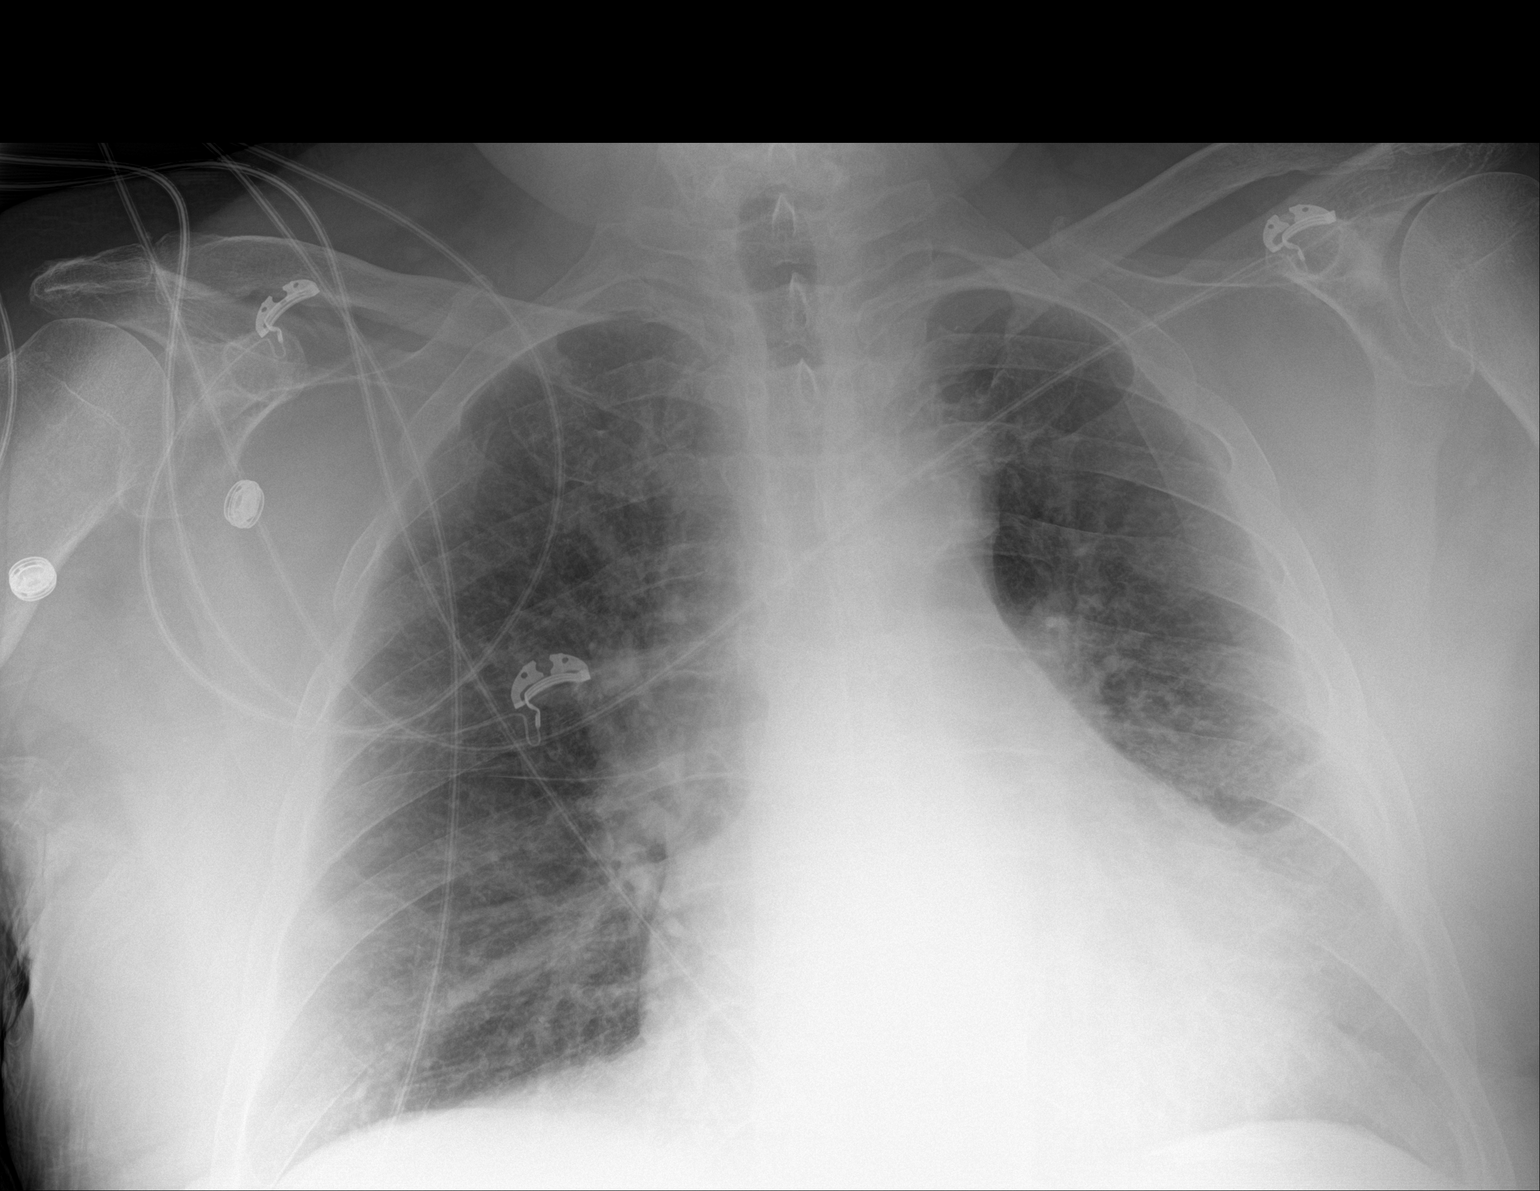

[1 of 1 positions shown; findings below may reference images not displayed]

FINDINGS: Cardiomegaly with pulmonary venous congestion. Mild bilateral
interstitial prominence. Mild CHF cannot be excluded. Left-sided
pleural thickening again noted. No pleural effusion or pneumothorax.
Costophrenic angles incompletely imaged.
IMPRESSION: Cardiomegaly with pulmonary venous congestion. Mild bilateral
interstitial prominence. Mild CHF cannot be excluded.

## 2020-08-14 MED ORDER — FUROSEMIDE 10 MG/ML IJ SOLN
20.0000 mg | Freq: Once | INTRAMUSCULAR | Status: AC
  Administered 2020-08-14: 20 mg via INTRAVENOUS
  Filled 2020-08-14: qty 2

## 2020-08-14 MED ORDER — INSULIN ASPART PROT & ASPART (70-30 MIX) 100 UNIT/ML ~~LOC~~ SUSP
20.0000 [IU] | Freq: Every day | SUBCUTANEOUS | Status: DC
  Administered 2020-08-15 – 2020-08-19 (×4): 20 [IU] via SUBCUTANEOUS
  Filled 2020-08-14 (×2): qty 10

## 2020-08-14 MED ORDER — ACETAMINOPHEN 325 MG PO TABS
650.0000 mg | ORAL_TABLET | Freq: Four times a day (QID) | ORAL | Status: DC | PRN
  Administered 2020-08-14 – 2020-08-16 (×5): 650 mg via ORAL
  Filled 2020-08-14 (×5): qty 2

## 2020-08-14 MED ORDER — SENNOSIDES-DOCUSATE SODIUM 8.6-50 MG PO TABS
1.0000 | ORAL_TABLET | Freq: Every day | ORAL | Status: DC
  Administered 2020-08-15 – 2020-08-19 (×3): 1 via ORAL
  Filled 2020-08-14 (×5): qty 1

## 2020-08-14 MED ORDER — POLYETHYLENE GLYCOL 3350 17 G PO PACK: 17.0000 g | PACK | Freq: Every day | ORAL | Status: DC

## 2020-08-14 MED ORDER — ACETAMINOPHEN 650 MG RE SUPP: 650.0000 mg | Freq: Four times a day (QID) | RECTAL | Status: DC | PRN

## 2020-08-14 MED ORDER — POLYETHYLENE GLYCOL 3350 17 G PO PACK
17.0000 g | PACK | Freq: Every day | ORAL | Status: DC
  Administered 2020-08-14 – 2020-08-16 (×2): 17 g via ORAL
  Filled 2020-08-14 (×6): qty 1

## 2020-08-14 MED ORDER — INSULIN ASPART PROT & ASPART (70-30 MIX) 100 UNIT/ML ~~LOC~~ SUSP
10.0000 [IU] | Freq: Every day | SUBCUTANEOUS | Status: DC
  Administered 2020-08-14 – 2020-08-18 (×5): 10 [IU] via SUBCUTANEOUS
  Filled 2020-08-14: qty 10

## 2020-08-14 MED ORDER — CLOPIDOGREL BISULFATE 75 MG PO TABS
75.0000 mg | ORAL_TABLET | Freq: Every day | ORAL | Status: DC
  Administered 2020-08-14 – 2020-08-19 (×6): 75 mg via ORAL
  Filled 2020-08-14 (×6): qty 1

## 2020-08-14 MED ORDER — VANCOMYCIN HCL 1250 MG/250ML IV SOLN
1250.0000 mg | INTRAVENOUS | Status: DC
  Filled 2020-08-14: qty 250

## 2020-08-14 MED ORDER — ATORVASTATIN CALCIUM 80 MG PO TABS
80.0000 mg | ORAL_TABLET | Freq: Every day | ORAL | Status: DC
  Administered 2020-08-14 – 2020-08-19 (×6): 80 mg via ORAL
  Filled 2020-08-14: qty 2
  Filled 2020-08-14 (×5): qty 1

## 2020-08-14 MED ORDER — OXYCODONE HCL 5 MG PO TABS
5.0000 mg | ORAL_TABLET | ORAL | Status: DC | PRN
  Administered 2020-08-14 – 2020-08-16 (×4): 5 mg via ORAL
  Filled 2020-08-14 (×4): qty 1

## 2020-08-14 MED ORDER — SODIUM CHLORIDE 0.9 % IV SOLN
2.0000 g | Freq: Two times a day (BID) | INTRAVENOUS | Status: DC
  Administered 2020-08-14 – 2020-08-15 (×3): 2 g via INTRAVENOUS
  Filled 2020-08-14 (×4): qty 2

## 2020-08-14 MED ORDER — CARVEDILOL 12.5 MG PO TABS
12.5000 mg | ORAL_TABLET | Freq: Two times a day (BID) | ORAL | Status: DC
  Administered 2020-08-14 – 2020-08-19 (×11): 12.5 mg via ORAL
  Filled 2020-08-14 (×6): qty 1
  Filled 2020-08-14: qty 4
  Filled 2020-08-14 (×4): qty 1

## 2020-08-14 MED ORDER — AMIODARONE HCL 200 MG PO TABS
200.0000 mg | ORAL_TABLET | Freq: Every day | ORAL | Status: DC
  Administered 2020-08-14 – 2020-08-19 (×6): 200 mg via ORAL
  Filled 2020-08-14 (×6): qty 1

## 2020-08-14 MED ORDER — ALBUTEROL SULFATE (2.5 MG/3ML) 0.083% IN NEBU
2.5000 mg | INHALATION_SOLUTION | Freq: Once | RESPIRATORY_TRACT | Status: AC
  Administered 2020-08-14: 2.5 mg via RESPIRATORY_TRACT
  Filled 2020-08-14: qty 3

## 2020-08-14 MED ORDER — VANCOMYCIN HCL 2000 MG/400ML IV SOLN
2000.0000 mg | Freq: Once | INTRAVENOUS | Status: AC
  Administered 2020-08-14: 2000 mg via INTRAVENOUS
  Filled 2020-08-14: qty 400

## 2020-08-14 NOTE — Telephone Encounter (Signed)
Patient significant other, [name], calls nurse line requesting referral for home health PT services. [name] reports that they have used [facility] in the past and would like to re-establish care with this organization.   To PCP  Talbot Grumbling, RN

## 2020-08-14 NOTE — ED Provider Notes (Signed)
[facility] EMERGENCY DEPARTMENT Provider Note  CSN: [id number] Arrival date & time: 08/14/20 2122  Chief Complaint(s) Post-op Problem  HPI Rodney [name] is a 70 y.o. male recently admitted and discharged yesterday from the hospital after having femoral artery stenting and bypass to the right lower extremity for peripheral vascular disease.  Patient was also treated for cellulitis and completed his course of antibiotics in the hospital.  During the hospitalization patient had worsening CKD and was taken off diuretics.  He returns today for weeping from the incision site.  Also has increased redness to the lower extremity.  He is endorsing persistent leg pain that was there during hospitalization.  No falls.  Patient reports after discharge she went home and sat in his wheelchair for 3 to 4 hours before moving to the couch.  While on the couch she propped his leg up but it not above his heart.  He did not use any compression stockings.  Patient denies any other physical complaints at this time.  HPI  Past Medical History Past Medical History:  Diagnosis Date  . CHF (congestive heart failure) ([place])   . Coronary artery disease   . Diabetes mellitus without complication ([place])   . HLD (hyperlipidemia)   . Hypertension   . Peripheral edema 02/24/2018  . SOB (shortness of breath) 07/26/2018   Patient Active Problem List   Diagnosis Date Noted  . Acute on chronic anemia   . Ascending aorta dilation ([place]) 08/04/2020  . Gangrene of toe of right foot ([place]) 08/04/2020  . Infective myositis of right foot   . Cellulitis of foot, right 08/01/2020  . 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 CAD (coronary artery disease) 02/15/2020  . CRI (chronic renal insufficiency), stage 3 (moderate) ([place]) 02/15/2020  . Long term (current) use of anticoagulants 12/29/2019  . S/P BKA (below knee amputation) unilateral, left ([place]) 11/14/2019  . High risk social situation 11/14/2019  . Atrial  fibrillation ([place]) 10/03/2019  . HFrEF (heart failure with reduced ejection fraction) ([place]) 09/28/2019  . Moderate aortic stenosis 09/28/2019  . Anemia in chronic illness 09/27/2019  . PVD (peripheral vascular disease) ([place])   . Umbilical hernia 48/25/0037  . Obesity 10/09/2015  . Tobacco abuse 09/07/2013  . DM (diabetes mellitus), type 2 with neurological complications ([place]) 04/88/8916  . Hypercholesteremia 10/28/2010  . ERECTILE DYSFUNCTION 05/22/2009  . Essential hypertension 01/23/2009   Home Medication(s) Prior to Admission medications   Medication Sig Start Date End Date Taking? Authorizing Provider  clopidogrel (PLAVIX) 75 MG tablet Take 1 tablet (75 mg total) by mouth daily. 08/14/20  Yes Ganta, Anupa, DO  amiodarone (PACERONE) 200 MG tablet Take 1 tablet (200 mg total) by mouth daily. 11/13/19   [name], MD  atorvastatin (LIPITOR) 80 MG tablet TAKE 1 TABLET EVERY DAY Patient taking differently: Take 80 mg by mouth daily. 03/25/20   O'NealCassie Freer, MD  Blood Glucose Monitoring Suppl (TRUE METRIX METER) DEVI Use to test blood sugar three times daily. 11/20/19   [name], MD  Blood Glucose Monitoring Suppl (TRUE METRIX METER) w/Device KIT USE AS DIRECTED 03/25/20   Hensel, Jamal Collin, MD  carvedilol (COREG) 12.5 MG tablet Take 1 tablet (12.5 mg total) by mouth 2 (two) times daily with a meal. Patient taking differently: Take 12.5 mg by mouth daily. 11/13/19   [name], MD  glucose blood (RELION TRUE METRIX TEST STRIPS) test strip Use to test blood sugar three times per day. 11/20/19  [name], MD  insulin NPH-regular Human (NOVOLIN 70/30 RELION) (70-30) 100 UNIT/ML injection Inject 15-30 Units into the skin See admin instructions. Inject 30 units into the skin with breakfast and 15 units with supper- "until eventually replaced by Lantus"    [provider]  metFORMIN (GLUCOPHAGE) 1000 MG tablet Take 0.5 tablets (500 mg total) by mouth 2  (two) times daily with a meal. Patient taking differently: Take 1,000 mg by mouth daily. 04/09/20   Lattie Haw, MD  multivitamin (RENA-VIT) TABS tablet Take 1 tablet by mouth at bedtime. 04/09/20   Lattie Haw, MD  nicotine (NICODERM CQ - DOSED IN MG/24 HR) 7 mg/24hr patch Place 7 mg onto the skin daily.    [provider]  TRUEplus Lancets 33G MISC Use to test blood sugar three times per day. 11/20/19   [name], MD  warfarin (COUMADIN) 5 MG tablet Take 1 tablet by mouth once daily Patient taking differently: Take 5-7.5 mg by mouth See admin instructions. Take 5 mg by mouth once a day on Sun/Tues/Thurs/Sat and 7.5 mg on Mon/Wed/Fri 07/08/20   Hilty, Nadean Corwin, MD                                                                                                                                    Past Surgical History Past Surgical History:  Procedure Laterality Date  . ABDOMINAL AORTOGRAM W/LOWER EXTREMITY N/A 08/05/2020   Procedure: ABDOMINAL AORTOGRAM W/LOWER EXTREMITY;  Surgeon: Marty Heck, MD;  Location: [place] CV LAB;  Service: Cardiovascular;  Laterality: N/A;  . AMPUTATION Left 09/28/2019   Procedure: AMPUTATION BELOW KNEE;  Surgeon: Newt Minion, MD;  Location: [place];  Service: Orthopedics;  Laterality: Left;  . CARDIAC CATHETERIZATION    . CARDIOVERSION N/A 10/05/2019   Procedure: CARDIOVERSION;  Surgeon: Sanda Klein, MD;  Location: [place] ENDOSCOPY;  Service: Cardiovascular;  Laterality: N/A;  . FEMORAL-POPLITEAL BYPASS GRAFT Right 08/07/2020   Procedure: RIGHT FEMORAL TO BELOW KNEE POPLITEAL ARTERY BYPASS;  Surgeon: Waynetta Sandy, MD;  Location: [place];  Service: Vascular;  Laterality: Right;  . LEFT HEART CATH AND CORONARY ANGIOGRAPHY N/A 10/03/2019   Procedure: LEFT HEART CATH AND CORONARY ANGIOGRAPHY;  Surgeon: Lorretta Harp, MD;  Location: [place] CV LAB;  Service: Cardiovascular;  Laterality: N/A;  . PERIPHERAL VASCULAR INTERVENTION  Right 08/05/2020   Procedure: PERIPHERAL VASCULAR INTERVENTION;  Surgeon: Marty Heck, MD;  Location: [place] CV LAB;  Service: Cardiovascular;  Laterality: Right;  common Iliac  . TEE WITHOUT CARDIOVERSION N/A 10/05/2019   Procedure: TRANSESOPHAGEAL ECHOCARDIOGRAM (TEE);  Surgeon: Sanda Klein, MD;  Location: [facility] ENDOSCOPY;  Service: Cardiovascular;  Laterality: N/A;   Family History Family History  Problem Relation Age of Onset  . Alcoholism Mother   . Alcoholism Father     Social History Social History   Tobacco Use  . Smoking status: Current Every Day  Smoker    Packs/day: 0.50    Years: 50.00    Pack years: 25.00    Types: Cigarettes  . Smokeless tobacco: Never Used  Substance Use Topics  . Alcohol use: Yes    Alcohol/week: 6.0 standard drinks    Types: 6 Standard drinks or equivalent per week  . Drug use: No   Allergies Patient has no known allergies.  Review of Systems Review of Systems All other systems are reviewed and are negative for acute change except as noted in the HPI  Physical Exam Vital Signs  I have reviewed the triage vital signs BP 135/69   Pulse 63   Temp 98.2 F (36.8 C) (Oral)   Resp 17   Ht 6' (1.829 m)   Wt 110.8 kg   SpO2 97%   BMI 33.13 kg/m   Physical Exam Vitals reviewed.  Constitutional:      General: He is not in acute distress.    Appearance: He is well-developed. He is not diaphoretic.  HENT:     Head: Normocephalic and atraumatic.     Jaw: No trismus.     Right Ear: External ear normal.     Left Ear: External ear normal.     Nose: Nose normal.  Eyes:     General: No scleral icterus.    Conjunctiva/sclera: Conjunctivae normal.  Neck:     Trachea: Phonation normal.  Cardiovascular:     Rate and Rhythm: Normal rate and regular rhythm.  Pulmonary:     Effort: Pulmonary effort is normal. No respiratory distress.     Breath sounds: No stridor.  Abdominal:     General: There is no distension.   Musculoskeletal:        General: Normal range of motion.     Cervical back: Normal range of motion.     Right lower leg: 2+ Edema (with weeping through the skin) present.     Right foot: Normal pulse (1+ DP and PT).       Legs:       Feet:  Skin:    Comments: Redness to right lower leg. Not tender with light tactile.  Neurological:     Mental Status: He is alert and oriented to person, place, and time.  Psychiatric:        Behavior: Behavior normal.     ED Results and Treatments Labs (all labs ordered are listed, but only abnormal results are displayed) Labs Reviewed  CBC - Abnormal; Notable for the following components:      Result Value   RBC 3.42 (*)    Hemoglobin 9.7 (*)    HCT 30.5 (*)    All other components within normal limits  COMPREHENSIVE METABOLIC PANEL - Abnormal; Notable for the following components:   Sodium 130 (*)    Chloride 96 (*)    Glucose, Bld 174 (*)    BUN 53 (*)    Creatinine, Ser 1.75 (*)    Calcium 8.6 (*)    Total Protein 6.2 (*)    Albumin 2.5 (*)    GFR, Estimated 42 (*)    All other components within normal limits  EKG  EKG Interpretation  Date/Time:    Ventricular Rate:    PR Interval:    QRS Duration:   QT Interval:    QTC Calculation:   R Axis:     Text Interpretation:        Radiology No results found.  Pertinent labs & imaging results that were available during my care of the patient were reviewed by me and considered in my medical decision making (see chart for details).  Medications Ordered in ED Medications - No data to display                                                                                                                                  Procedures Procedures  (including critical care time)  Medical Decision Making / ED Course I have reviewed the nursing notes for this  encounter and the patient's prior records (if available in EHR or on provided paperwork).   Rodney [name] was evaluated in Emergency Department on 08/14/2020 for the symptoms described in the history of present illness. He was evaluated in the context of the global COVID-19 pandemic, which necessitated consideration that the patient might be at risk for infection with the SARS-CoV-2 virus that causes COVID-19. Institutional protocols and algorithms that pertain to the evaluation of patients at risk for COVID-19 are in a state of rapid change based on information released by regulatory bodies including the CDC and federal and state organizations. These policies and algorithms were followed during the patient's care in the ED.  Patient returns for right lower extremity edema and weeping from the surgical incisions. He has redness to the right lower leg likely related to his edema.  However patient just got treated for cellulitis and has recent incisions. Screening labs reassuring without leukocytosis. Stable hemoglobin. No significant electrolyte derangements. Renal function appears to be improving from discharge.  I called and spoke to family medicine who had admitted the patient during the last visit and requested they see the patient to determine whether they would want to start him on low-dose diuretics and/or antibiotics.  Patient care turned over to Dr Rex Kras. Patient case and results discussed in detail; please see their note for further ED managment.       Final Clinical Impression(s) / ED Diagnoses Final diagnoses:  None      This chart was dictated using voice recognition software.  Despite best efforts to proofread,  errors can occur which can change the documentation meaning.   Fatima Blank, MD 08/14/20 [phone]

## 2020-08-14 NOTE — ED Notes (Signed)
Attempt unsuccessful to obtain second set of blood cultures prior to abx admin.

## 2020-08-14 NOTE — H&P (Signed)
[name] Service Pager: [phone]  Patient name: Rodney [name] Medical record number: [id number] Date of birth: 04-12-1951 Age: 70 y.o. Gender: male  Primary Care Provider: [name], MD Consultants: VVS Code Status: Full Code  Preferred Emergency Contact: [name], wife, [phone]  Chief Complaint: right leg swelling and redness  Assessment and Plan: Rodney [name] is a 70 y.o. male presenting with right leg swelling and redness . 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  Right lower extremity swelling and erythema, suspect cellulitis Patient discharged from hospital yesterday following admission for cellulitis of RLE, treated with vanc/cefepime 4/7-11 and then doxycycline and keflex, completed total 10 day course.  He was also found to have PAD, see below, and had stenting of right common iliac 4/11 and RLE superficial femoral to below-knee popliteal artery bypass on 4/13.  Last PM at 88, wife noted that patient had worsening of erythema and edema.  No fevers, SOB.  On discharge, had AKI on CKD and lasix and spironolactone were held which could be contributing to edema.  Patient reports good UOP.  Has been compliant with warfarin since discharge, but could consider DVT, will obtain US to rule out.  On exam, has significant erythema to RLE, serous weeping, and increased warmth to palpation, WBC 9, afebrile, but given significat history of necrotizing fasciitis and recent vascular procedure, will re-start on vanc and cefepime and obtain blood cultures.  Will consult vascular to see patient as well given his recent procedure. - place in obs, Dr.Eniola's service - vitals per floor - blood cultures - vanc and cefepime per pharm (4/20- ) - cont warfarin - DVT US - f/u Vascular Consult, appreciate  recs - lasix 20 IV x1 - CXR - INR now and daily while inpatient, goal 2-3 - NPO sips with meds pending vascular recs  PAD S/P right superficial femoral to below-knee popliteal artery bypass 4/13 Worsening edema and erythema per above since last PM.  Discharged yesterday.   - vascular consulted per above - cont plavix, warfarin  AKI on CKD 3, improved Cr now improved 1.75, was 2.05 yesterday.  Previously was on Lasix 66m, but had been increased to 457m  Lasix, spiro, losartan were held on discharge.  Given edema, will give one time dose of Lasix 2079mV, would be cautious to worsen Cr.   - IV Lasix 23m58m - monitor Cr and BMP and determine ability to continue - cont to hold losartan and spiro  HFpEF, Aortic Stenosis: Chronic, stable Last Echo 07/30/20, EF 50-55% with G2DD.  Follows with [facility].  Had been holding spiro, lasix, losartan in setting of AKI. Now with 2-3+ RLE pitting edema.  AKI improved per above.  Breathing comfortably on RA with appropriate sats.   - cont coreg BID - IV Lasix 23mg47m then reasses - BMP per above - cont to hold losartan and spiro, restart as able  Constipation Reports that he has not had a BM in 2 weeks.  Reports taking miralax three times in the last few days without improvement. - will give 1 dose senna and 1 dose miralax - consider tap water enema if does not improve  Hyponatremia: Chronic, stable Appears chronic, 128-133 is baseline.   - monitor BMP - Lasix per above, could consider hypervolemia as cause  Hypertension: Chronic, stable Initially hypertensive on presentation at  172/74, BP 127/62 on exam - Continue home Coreg 12.5 mg twice daily - Holding losartan per above - Lasix as above  PAF on Warfarin No EKG in ED, will order. Regular rhythm on exam.  Reports warfarin compliance.  INR 2.1 on 4/19. - warfarin per pharm - cont home amiodarone - obtain EKG - INR now, and monitor daily  Normocytic Anemia S/P acute blood loss  anemia.  Hemoglobin improving, 8.5 yesterday, 9.7 today on admission. - Continue to monitor CBC  T2DM with neuropathy: Chronic Last A1c 6.5 on 07/03/2020.  Discharged on NovoLog 70/30 with 20u QAM, 10u QHS.  Was stable on this regimen during last hospitalization. - CBGs with meals and nightly - holding home metformin - cont 70/30 20u QAM, 10u QHS  HLD  CAD No chest pain. - cont home lipitor - on warfarin and plavix  FEN/GI: NPO pending Vascular Recs Prophylaxis: Warfarin  Disposition: place in obs, med-surg  History of Present Illness:  Rodney [name] is a 70 y.o. male presenting with right lower leg erythema and swelling.  Discharged from hospital yesterday following treatment for cellulitis with 10 days of antibiotics, and PAD with stenting and bypass in RLE.  Wife reports that at 11pm last night, she noticed that his right leg was getting red, swelling, and starting to develop blisters.  He can to the ED around 0200 because of continued worsening.  No fevers.  Otherwise feeling unchanged from his discharge yesterday.  Denies fevers, chills, shortness of breath, chest pain.  Has been compliant with warfarin, has not taken lasix or spironolactone because of AKI and it was held on discharge.  Review Of Systems: Per HPI with the following additions:   Review of Systems  Constitutional: Negative for chills and fever.  HENT: Negative for congestion.   Eyes: Negative for visual disturbance.  Respiratory: Negative for cough and shortness of breath.   Cardiovascular: Positive for leg swelling. Negative for chest pain.  Gastrointestinal: Positive for constipation. Negative for nausea and vomiting.  Genitourinary: Negative for decreased urine volume and difficulty urinating.  Musculoskeletal:       Right leg pain  Skin: Positive for color change (erythema of right leg).  Neurological: Negative for dizziness, syncope, numbness and headaches.  All other systems reviewed and are  negative.    Patient Active Problem List   Diagnosis Date Noted  . Acute on chronic anemia   . Ascending aorta dilation ([place]) 08/04/2020  . Gangrene of toe of right foot ([place]) 08/04/2020  . Infective myositis of right foot   . Cellulitis of foot, right 08/01/2020  . 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 CAD (coronary artery disease) 02/15/2020  . CRI (chronic renal insufficiency), stage 3 (moderate) ([place]) 02/15/2020  . Long term (current) use of anticoagulants 12/29/2019  . S/P BKA (below knee amputation) unilateral, left ([place]) 11/14/2019  . High risk social situation 11/14/2019  . Atrial fibrillation ([place]) 10/03/2019  . HFrEF (heart failure with reduced ejection fraction) ([place]) 09/28/2019  . Moderate aortic stenosis 09/28/2019  . Anemia in chronic illness 09/27/2019  . PVD (peripheral vascular disease) ([place])   . Umbilical hernia 61/60/7371  . Obesity 10/09/2015  . Tobacco abuse 09/07/2013  . DM (diabetes mellitus), type 2 with neurological complications ([place]) 10/21/9483  . Hypercholesteremia 10/28/2010  . ERECTILE DYSFUNCTION 05/22/2009  . Essential hypertension 01/23/2009    Past Medical History: Past Medical History:  Diagnosis Date  . CHF (congestive heart failure) ([place])   . Coronary artery disease   .  Diabetes mellitus without complication ([place])   . HLD (hyperlipidemia)   . Hypertension   . Peripheral edema 02/24/2018  . SOB (shortness of breath) 07/26/2018    Past Surgical History: Past Surgical History:  Procedure Laterality Date  . ABDOMINAL AORTOGRAM W/LOWER EXTREMITY N/A 08/05/2020   Procedure: ABDOMINAL AORTOGRAM W/LOWER EXTREMITY;  Surgeon: Marty Heck, MD;  Location: [place] CV LAB;  Service: Cardiovascular;  Laterality: N/A;  . AMPUTATION Left 09/28/2019   Procedure: AMPUTATION BELOW KNEE;  Surgeon: Newt Minion, MD;  Location: [place];  Service: Orthopedics;  Laterality: Left;  . CARDIAC CATHETERIZATION    . CARDIOVERSION N/A 10/05/2019   Procedure:  CARDIOVERSION;  Surgeon: Sanda Klein, MD;  Location: [place] ENDOSCOPY;  Service: Cardiovascular;  Laterality: N/A;  . FEMORAL-POPLITEAL BYPASS GRAFT Right 08/07/2020   Procedure: RIGHT FEMORAL TO BELOW KNEE POPLITEAL ARTERY BYPASS;  Surgeon: Waynetta Sandy, MD;  Location: [place];  Service: Vascular;  Laterality: Right;  . LEFT HEART CATH AND CORONARY ANGIOGRAPHY N/A 10/03/2019   Procedure: LEFT HEART CATH AND CORONARY ANGIOGRAPHY;  Surgeon: Lorretta Harp, MD;  Location: [place] CV LAB;  Service: Cardiovascular;  Laterality: N/A;  . PERIPHERAL VASCULAR INTERVENTION Right 08/05/2020   Procedure: PERIPHERAL VASCULAR INTERVENTION;  Surgeon: Marty Heck, MD;  Location: [place] CV LAB;  Service: Cardiovascular;  Laterality: Right;  common Iliac  . TEE WITHOUT CARDIOVERSION N/A 10/05/2019   Procedure: TRANSESOPHAGEAL ECHOCARDIOGRAM (TEE);  Surgeon: Sanda Klein, MD;  Location: [facility] ENDOSCOPY;  Service: Cardiovascular;  Laterality: N/A;    Social History: Social History   Tobacco Use  . Smoking status: Current Every Day Smoker    Packs/day: 0.50    Years: 50.00    Pack years: 25.00    Types: Cigarettes  . Smokeless tobacco: Never Used  Substance Use Topics  . Alcohol use: Yes    Alcohol/week: 6.0 standard drinks    Types: 6 Standard drinks or equivalent per week  . Drug use: No   Additional social history:  Please also refer to relevant sections of EMR.  Family History: Family History  Problem Relation Age of Onset  . Alcoholism Mother   . Alcoholism Father     Allergies and Medications: No Known Allergies No current facility-administered medications on file prior to encounter.   Current Outpatient Medications on File Prior to Encounter  Medication Sig Dispense Refill  . clopidogrel (PLAVIX) 75 MG tablet Take 1 tablet (75 mg total) by mouth daily. 30 tablet 0  . amiodarone (PACERONE) 200 MG tablet Take 1 tablet (200 mg total) by mouth daily. 90 tablet 3   . atorvastatin (LIPITOR) 80 MG tablet TAKE 1 TABLET EVERY DAY (Patient taking differently: Take 80 mg by mouth daily.) 90 tablet 3  . Blood Glucose Monitoring Suppl (TRUE METRIX METER) DEVI Use to test blood sugar three times daily. 1 each 0  . Blood Glucose Monitoring Suppl (TRUE METRIX METER) w/Device KIT USE AS DIRECTED 1 kit 0  . carvedilol (COREG) 12.5 MG tablet Take 1 tablet (12.5 mg total) by mouth 2 (two) times daily with a meal. (Patient taking differently: Take 12.5 mg by mouth daily.) 180 tablet 3  . glucose blood (RELION TRUE METRIX TEST STRIPS) test strip Use to test blood sugar three times per day. 300 each 3  . insulin NPH-regular Human (NOVOLIN 70/30 RELION) (70-30) 100 UNIT/ML injection Inject 15-30 Units into the skin See admin instructions. Inject 30 units into the skin with breakfast and 15 units  with supper- "until eventually replaced by Lantus"    . metFORMIN (GLUCOPHAGE) 1000 MG tablet Take 0.5 tablets (500 mg total) by mouth 2 (two) times daily with a meal. (Patient taking differently: Take 1,000 mg by mouth daily.) 180 tablet 3  . multivitamin (RENA-VIT) TABS tablet Take 1 tablet by mouth at bedtime.  0  . nicotine (NICODERM CQ - DOSED IN MG/24 HR) 7 mg/24hr patch Place 7 mg onto the skin daily.    . TRUEplus Lancets 33G MISC Use to test blood sugar three times per day. 300 each 3  . warfarin (COUMADIN) 5 MG tablet Take 1 tablet by mouth once daily (Patient taking differently: Take 5-7.5 mg by mouth See admin instructions. Take 5 mg by mouth once a day on Sun/Tues/Thurs/Sat and 7.5 mg on Mon/Wed/Fri) 60 tablet 0    Objective: BP 115/61   Pulse 61   Temp 98.2 F (36.8 C) (Oral)   Resp 18   Ht 6' (1.829 m)   Wt 110.8 kg   SpO2 92%   BMI 33.13 kg/m   Physical Exam:  General: 70 y.o. male in NAD HEENT: NCAT, rhinophyma Neck: No obvious JVD Cardio: RRR, grad 3 systolic murmur RUSB Lungs: CTAB, no wheezing, no rhonchi, no crackles, no IWOB on RA Abdomen: Soft,  non-tender to palpation, slightly distended, positive bowel sounds Skin: warm and dry Extremities: RLE with linear incisions with dried blood, erythemanoted from knee to foot, few areas of open bullae with serous fluid noted to be draining onto pad below, TTP with increased warmth, 2-3 pitting edema RLE, S/P LLE amputation  Psych: mood and affect appropriate for circumstance          Labs and Imaging: CBC BMET  Recent Labs  Lab 08/14/20 0325  WBC 9.5  HGB 9.7*  HCT 30.5*  PLT 400   Recent Labs  Lab 08/14/20 0325  NA 130*  K 4.7  CL 96*  CO2 24  BUN 53*  CREATININE 1.75*  GLUCOSE 174*  CALCIUM 8.6*     EKG: pending  No results found.  [place], DO 08/14/2020, 8:29 AM PGY-3, [facility] pager: [phone], text pages welcome

## 2020-08-14 NOTE — Progress Notes (Addendum)
Pharmacy Antibiotic Note  Rodney [name] is a 70 y.o. male recently discharged 4/19, re-admitted on 08/14/2020 with cellulitis. Patient with R lower extremity swelling, WBC 9 and afebrile. Patient previously admitted 4/7 and treated with vanc/cefepime 4/7-4/11 then cephalexin and doxycycline through 4/16 for 10 total days of therapy. During last admission, patient had AKI which has since improved, Scr 1.7. Pharmacy has been consulted for vancomycin and cefepime dosing.  Plan:  Load with 2g vancomycin IV x1, then administer Vancomycin 1250 IV every 24 hours.  Goal trough 10-15 mcg/mL.  Cefepime 2 g IV every 12 hours (see previous note) Monitor Scr daily  Monitor vancomycin trough levels as appropriate  F/U de-escalation plan   Height: 6' (182.9 cm) Weight: 110.8 kg (244 lb 4.3 oz) IBW/kg (Calculated) : 77.6  Temp (24hrs), Avg:98.3 F (36.8 C), Min:98.2 F (36.8 C), Max:98.3 F (36.8 C)  Recent Labs  Lab 08/10/20 0108 08/11/20 0139 08/12/20 0233 08/13/20 0050 08/14/20 0325  WBC 8.9 7.4 5.7 5.7 9.5  CREATININE 2.64* 2.25* 2.22* 2.05* 1.75*    Estimated Creatinine Clearance: 51.9 mL/min (A) (by C-G formula based on SCr of 1.75 mg/dL (H)).    No Known Allergies  Antimicrobials this admission: Cefepime 4/20 >>   Dose adjustments this admission: None  Microbiology results: 4/20 BCx:   Thank you for allowing pharmacy to be a part of this patient's care.  Benna Dunks  PharmD Candidate, Class of 2022 08/14/2020 11:27 AM

## 2020-08-14 NOTE — Progress Notes (Addendum)
ANTICOAGULATION CONSULT NOTE - Follow-up Consult  Pharmacy Consult for warfarin Indication: atrial fibrillation  No Known Allergies  Patient Measurements: Height: 6' (182.9 cm) Weight: 110.8 kg (244 lb 4.3 oz) IBW/kg (Calculated) : 77.6  Vital Signs: Temp: 98.2 F (36.8 C) (04/20 0600) Temp Source: Oral (04/20 0600) BP: 127/62 (04/20 0815) Pulse Rate: 60 (04/20 0815)  Labs: Recent Labs    08/12/20 0233 08/13/20 0050 08/14/20 0325  HGB 8.8* 8.5* 9.7*  HCT 27.4* 26.5* 30.5*  PLT 271 300 400  LABPROT 19.6* 23.2*  --   INR 1.7* 2.1*  --   CREATININE 2.22* 2.05* 1.75*    Estimated Creatinine Clearance: 51.9 mL/min (A) (by C-G formula based on SCr of 1.75 mg/dL (H)).   Medical History: Past Medical History:  Diagnosis Date  . CHF (congestive heart failure) ([place])   . Coronary artery disease   . Diabetes mellitus without complication ([place])   . HLD (hyperlipidemia)   . Hypertension   . Peripheral edema 02/24/2018  . SOB (shortness of breath) 07/26/2018    Assessment: 56 yom on warfarin for afib recently discharged on 4/19 and re-admitted 4/20 with leg swelling/redness near surgical incision site. Warfarin held during previous admit 4/10-4/14 for R common femoral to below-knee popliteal bypass on 4/13 and resumed 4/14 with enoxaparin bridge. Pt had acute drop in Hgb with R groin hematoma but no overt bleeding on 4/16 - enoxaparin d/c'd and warfarin held at that time. Warfarin resumed 4/17. INR therapeutic at 2.1 prior to discharge on 4/19.  INR still pending for today. Noted missed warfarin dose on 4/19. CBC stable. No current active bleed issues reported. PTA amiodarone continued inpatient.  PTA warfarin dose - 5 mg daily except 7.5 mg MWF  Goal of Therapy:  INR 2-3 Monitor platelets by anticoagulation protocol: Yes   Plan:  F/u pending INR to enter warfarin dose for today (note missed dose 4/19) Monitor daily INR, CBC, s/sx bleeding   Arturo Morton, PharmD,  BCPS Please check AMION for all [facility] contact numbers Clinical Pharmacist 08/14/2020 9:07 AM  Addendum:   INR 3.4 this AM. Patient found to have DVT in R groin on ultrasound of leg 4/20, FM team aware. No warfarin today considering elevated INR. FM team following up with [facility] to determine if additional therapy is needed.

## 2020-08-14 NOTE — ED Notes (Signed)
Patient transported to Ultrasound 

## 2020-08-14 NOTE — ED Notes (Signed)
Report given Kari Baars, [place]

## 2020-08-14 NOTE — ED Notes (Signed)
Pts right leg post surgical graph. Below knee is red and swollen. Clear drainage noted to chux under leg. Incision sites with good wound approximation.

## 2020-08-14 NOTE — Progress Notes (Addendum)
Right lower extremity venous duplex and IVC/iliac vein duplex completed. Refer to "CV Proc" under chart review to view preliminary results.  Preliminary results discussed with Dr. Sandi Carne.  08/14/2020 11:00 AM Kelby Aline., MHA, RVT, RDCS, RDMS

## 2020-08-14 NOTE — ED Triage Notes (Signed)
Bleeding from surgical incision site and weeping wound draining from right leg.

## 2020-08-14 NOTE — Progress Notes (Signed)
Pharmacy Antibiotic Note  Rodney [name] is a 70 y.o. male recently discharged from the hospital on 4/19, now re-admitted on 08/14/2020 with cellulitis.  Pharmacy has been consulted for cefepime dosing. SCr down to 1.75 today.  Plan: Cefepime 2g IV q12h Monitor clinical progress, c/s, renal function F/u de-escalation plan/LOT   Height: 6' (182.9 cm) Weight: 110.8 kg (244 lb 4.3 oz) IBW/kg (Calculated) : 77.6  Temp (24hrs), Avg:98.1 F (36.7 C), Min:97.8 F (36.6 C), Max:98.3 F (36.8 C)  Recent Labs  Lab 08/10/20 0108 08/11/20 0139 08/12/20 0233 08/13/20 0050 08/14/20 0325  WBC 8.9 7.4 5.7 5.7 9.5  CREATININE 2.64* 2.25* 2.22* 2.05* 1.75*    Estimated Creatinine Clearance: 51.9 mL/min (A) (by C-G formula based on SCr of 1.75 mg/dL (H)).    No Known Allergies   Arturo Morton, PharmD, BCPS Please check AMION for all [facility] contact numbers Clinical Pharmacist 08/14/2020 9:01 AM

## 2020-08-14 NOTE — ED Provider Notes (Signed)
I received this patient in signout from Dr. Leonette Monarch.  Recently, patient was recently admitted to the family medicine service and had fem artery bypass of R leg.  He presented to the ED for leg swelling and redness near surgical incision site.  At time of signout, awaiting family medicine assessment in the ED as they were familiar with his care.  Family medicine has evaluated the patient and decided to readmit for antibiotics and restart of diuretics.   Rodney [name], Rodney Overland, MD 08/14/20 0830

## 2020-08-14 NOTE — Consult Note (Signed)
Hospital Consult    Reason for Consult: Right lower extremity cellulitis and edema Requesting Physician: Family medicine teaching service MRN #:  [id number]  History of Present Illness: This is a 70 y.o. male who is status post right SFA to below-knee popliteal artery bypass with saphenous vein graft due to critical right lower extremity ischemia and wounds of the toes of the right foot.  This was performed by Dr. Donzetta Matters on 08/07/2020.  During his postoperative course he was noted to have right lower extremity edema however no sign of cellulitis or blistering.  He was discharged home yesterday.  Patient states he and his girlfriend noticed an increasing intensity of redness and edema of right lower extremity which prompted him to be brought to the emergency department.  He states his right lower leg is painful however pain is controlled with pain medication given in the emergency department.  He has sensation and motor control in his right foot.  He had some subjective fever and chills this morning.  During work-up in the emergency department blood cultures have been drawn.  He has also been started on broad-spectrum IV antibiotics and readmitted to the family medicine teaching service.  Past Medical History:  Diagnosis Date  . CHF (congestive heart failure) ([place])   . Coronary artery disease   . Diabetes mellitus without complication ([place])   . HLD (hyperlipidemia)   . Hypertension   . Peripheral edema 02/24/2018  . SOB (shortness of breath) 07/26/2018    Past Surgical History:  Procedure Laterality Date  . ABDOMINAL AORTOGRAM W/LOWER EXTREMITY N/A 08/05/2020   Procedure: ABDOMINAL AORTOGRAM W/LOWER EXTREMITY;  Surgeon: Marty Heck, MD;  Location: [facility];  Service: Cardiovascular;  Laterality: N/A;  . AMPUTATION Left 09/28/2019   Procedure: AMPUTATION BELOW KNEE;  Surgeon: Newt Minion, MD;  Location: [facility];  Service: Orthopedics;  Laterality: Left;  . CARDIAC  CATHETERIZATION    . CARDIOVERSION N/A 10/05/2019   Procedure: CARDIOVERSION;  Surgeon: Sanda Klein, MD;  Location: [facility];  Service: Cardiovascular;  Laterality: N/A;  . FEMORAL-POPLITEAL BYPASS GRAFT Right 08/07/2020   Procedure: RIGHT FEMORAL TO BELOW KNEE POPLITEAL ARTERY BYPASS;  Surgeon: Waynetta Sandy, MD;  Location: [facility];  Service: Vascular;  Laterality: Right;  . LEFT HEART CATH AND CORONARY ANGIOGRAPHY N/A 10/03/2019   Procedure: LEFT HEART CATH AND CORONARY ANGIOGRAPHY;  Surgeon: Lorretta Harp, MD;  Location: [facility];  Service: Cardiovascular;  Laterality: N/A;  . PERIPHERAL VASCULAR INTERVENTION Right 08/05/2020   Procedure: PERIPHERAL VASCULAR INTERVENTION;  Surgeon: Marty Heck, MD;  Location: [facility];  Service: Cardiovascular;  Laterality: Right;  common Iliac  . TEE WITHOUT CARDIOVERSION N/A 10/05/2019   Procedure: TRANSESOPHAGEAL ECHOCARDIOGRAM (TEE);  Surgeon: Sanda Klein, MD;  Location: [facility];  Service: Cardiovascular;  Laterality: N/A;    No Known Allergies  Prior to Admission medications   Medication Sig Start Date End Date Taking? Authorizing Provider  clopidogrel (PLAVIX) 75 MG tablet Take 1 tablet (75 mg total) by mouth daily. 08/14/20  Yes Ganta, Anupa, DO  amiodarone (PACERONE) 200 MG tablet Take 1 tablet (200 mg total) by mouth daily. 11/13/19   [name], MD  atorvastatin (LIPITOR) 80 MG tablet TAKE 1 TABLET EVERY DAY Patient taking differently: Take 80 mg by mouth daily. 03/25/20   O'NealCassie Freer, MD  Blood Glucose Monitoring Suppl (TRUE METRIX METER) DEVI Use to test blood sugar three times daily. 11/20/19   Hensel,  Jamal Collin, MD  Blood Glucose Monitoring Suppl (TRUE METRIX METER) w/Device KIT USE AS DIRECTED 03/25/20   Hensel, Jamal Collin, MD  carvedilol (COREG) 12.5 MG tablet Take 1 tablet (12.5 mg total) by mouth 2 (two) times daily with a meal. Patient taking differently: Take 12.5 mg by  mouth daily. 11/13/19   [name], MD  glucose blood (RELION TRUE METRIX TEST STRIPS) test strip Use to test blood sugar three times per day. 11/20/19   [name], MD  insulin NPH-regular Human (NOVOLIN 70/30 RELION) (70-30) 100 UNIT/ML injection Inject 15-30 Units into the skin See admin instructions. Inject 30 units into the skin with breakfast and 15 units with supper- "until eventually replaced by Lantus"    [provider]  metFORMIN (GLUCOPHAGE) 1000 MG tablet Take 0.5 tablets (500 mg total) by mouth 2 (two) times daily with a meal. Patient taking differently: Take 1,000 mg by mouth daily. 04/09/20   Lattie Haw, MD  multivitamin (RENA-VIT) TABS tablet Take 1 tablet by mouth at bedtime. 04/09/20   Lattie Haw, MD  nicotine (NICODERM CQ - DOSED IN MG/24 HR) 7 mg/24hr patch Place 7 mg onto the skin daily.    [provider]  TRUEplus Lancets 33G MISC Use to test blood sugar three times per day. 11/20/19   [name], MD  warfarin (COUMADIN) 5 MG tablet Take 1 tablet by mouth once daily Patient taking differently: Take 5-7.5 mg by mouth See admin instructions. Take 5 mg by mouth once a day on Sun/Tues/Thurs/Sat and 7.5 mg on Mon/Wed/Fri 07/08/20   Hilty, Nadean Corwin, MD    Social History   Socioeconomic History  . Marital status: Widowed    Spouse name: Not on file  . Number of children: Not on file  . Years of education: Not on file  . Highest education level: Not on file  Occupational History  . Not on file  Tobacco Use  . Smoking status: Current Every Day Smoker    Packs/day: 0.50    Years: 50.00    Pack years: 25.00    Types: Cigarettes  . Smokeless tobacco: Never Used  Substance and Sexual Activity  . Alcohol use: Yes    Alcohol/week: 6.0 standard drinks    Types: 6 Standard drinks or equivalent per week  . Drug use: No  . Sexual activity: Yes    Partners: Female    Comment: monagamous stable relationship  Other Topics Concern  .  Not on file  Social History Narrative  . Not on file   Social Determinants of Health   Financial Resource Strain: Not on file  Food Insecurity: Not on file  Transportation Needs: Not on file  Physical Activity: Not on file  Stress: Not on file  Social Connections: Not on file  Intimate Partner Violence: Not on file     Family History  Problem Relation Age of Onset  . Alcoholism Mother   . Alcoholism Father     ROS: Otherwise negative unless mentioned in HPI  Physical Examination  Vitals:   08/14/20 0800 08/14/20 0815  BP: 127/62 127/62  Pulse: 61 60  Resp: 20 19  Temp:    SpO2: 93% 95%   Body mass index is 33.13 kg/m.  General:  WDWN in NAD Gait: Not observed HENT: WNL, normocephalic Pulmonary: normal non-labored breathing Cardiac: regular Abdomen:  soft, NT/ND, no masses Skin: cellulitic rash of RLE below the knee Vascular Exam/Pulses: Palpable right femoral pulse; brisk right DP  and PT signal by Doppler Extremities: Cellulitic rash below the level of the knee right lower extremity with pitting edema and blistering; no area of fluctuance near the below the knee incision; incisions of the upper right lower extremity appear unremarkable; toe wounds demarcating well Musculoskeletal: no muscle wasting or atrophy  Neurologic: A&O X 3;  No focal weakness or paresthesias are detected; speech is fluent/normal Psychiatric:  The pt has Normal affect. Lymph:  Unremarkable  CBC    Component Value Date/Time   WBC 9.5 08/14/2020 0325   RBC 3.42 (L) 08/14/2020 0325   HGB 9.7 (L) 08/14/2020 0325   HGB 12.4 (L) 02/15/2020 0912   HCT 30.5 (L) 08/14/2020 0325   HCT 36.8 (L) 02/15/2020 0912   PLT 400 08/14/2020 0325   PLT 281 02/15/2020 0912   MCV 89.2 08/14/2020 0325   MCV 91 02/15/2020 0912   MCH 28.4 08/14/2020 0325   MCHC 31.8 08/14/2020 0325   RDW 13.2 08/14/2020 0325   RDW 12.2 02/15/2020 0912   LYMPHSABS 1.3 04/03/2020 0956   MONOABS 0.3 04/03/2020 0956    EOSABS 0.0 04/03/2020 0956   BASOSABS 0.1 04/03/2020 0956    BMET    Component Value Date/Time   NA 130 (L) 08/14/2020 0325   NA 136 07/30/2020 1357   K 4.7 08/14/2020 0325   CL 96 (L) 08/14/2020 0325   CO2 24 08/14/2020 0325   GLUCOSE 174 (H) 08/14/2020 0325   BUN 53 (H) 08/14/2020 0325   BUN 40 (H) 07/30/2020 1357   CREATININE 1.75 (H) 08/14/2020 0325   CREATININE 1.19 10/09/2015 1554   CALCIUM 8.6 (L) 08/14/2020 0325   GFRNONAA 42 (L) 08/14/2020 0325   GFRNONAA 65 10/09/2015 1554   GFRAA 52 (L) 05/01/2020 1632   GFRAA 75 10/09/2015 1554    COAGS: Lab Results  Component Value Date   INR 3.4 (H) 08/14/2020   INR 2.1 (H) 08/13/2020   INR 1.7 (H) 08/12/2020     Non-Invasive Vascular Imaging:   Venous duplex pending  ASSESSMENT/PLAN: This is a 70 y.o. male who is 1 week status post right SFA to below the knee popliteal bypass with saphenous vein graft; readmitted with cellulitis of right lower extremity  -Right foot seems to be well perfused with motor and sensation intact and brisk right DP and PT signal by Doppler suggesting a patent bypass graft -Vein conduit used for bypass making bypass graft infection unlikely; no indication for surgical exploration currently -Agree with admission for IV antibiotics, leg elevation, and diuresis; blood cultures pending -On-call vascular surgeon Dr. [name] will evaluate the patient later today and provide further treatment plans  Dagoberto Ligas PA-C [facility] [phone]

## 2020-08-14 NOTE — Progress Notes (Signed)
FPTS Interim Progress Note  S: Went to bedside to evaluate patient after receiving page from RN about patient feeling wheezy and requesting breathing treatment.  Also having significant pain in his right lower extremity, requesting something else for pain as he only has as needed Tylenol ordered.  Patient reports feeling wheezy and tight in the chest.  Denies feeling short of breath.  He states that he sometimes feels wheezy at home and will occasionally take his girlfriends Spiriva inhaler, which usually helps.  Patient states that he has smoked intermittently for over 50 years, as high as 2 to 3 packs/day.  He has been trying to cut back on smoking, and a pack will last him a few days now.  He is unsure if he has had PFTs done in the past.  Regarding pain, he states that he has pain throughout his entire right lower extremity, mostly in his foot and ankle.  The right lower extremity is wrapped.  He states he is unsure if his right lower extremity swelling has improved from before.  O: BP 115/64 (BP Location: Left Arm)   Pulse 62   Temp 98 F (36.7 C) (Oral)   Resp 19   Ht 6' (1.829 m)   Wt 110.8 kg   SpO2 95%   BMI 33.13 kg/m   General: Patient resting in bed comfortably, NAD CV: RRR, 3/6 systolic murmur best heard at the LUSB Respiratory: Faint scattered expiratory wheezes, bibasilar rales, breathing comfortably on room air Extremities: Right lower extremity wrapped, s/p left BKA  A/P: No respiratory distress at this time with stable vitals and adequate SpO2.  He has no formal diagnosis of COPD, but could possibly have undiagnosed COPD given significant smoking history.  Wheezes could be related to undiagnosed COPD or cardiac wheeze in the setting of CHF.  He does have bibasilar rales on exam which suggests that he is still volume overloaded.  He received a dose of IV furosemide 20 mg this morning, so far 400 cc of urine has been charted.  Still with AKI, though improving.  Serum  creatinine 1.75 this morning, slightly improved from 2.05 on discharge yesterday.  We will go ahead and give him another dose of IV furosemide as well as breathing treatment.  Given that he has a DVT despite being supratherapeutic on warfarin (INR 3.4 today), PE is a consideration.  He is reassuringly stable with adequate SpO2 and normal heart rate without respiratory distress or subjective complaints of dyspnea.  If any clinical worsening, will consider CTPA or VQ scan to evaluate for PE, being mindful of his kidney function.  As far as pain control, will add on as needed oxycodone (he was receiving oxycodone-acetaminophen 5-325 mg every 4 hours as needed during most recent hospitalization). - albuterol neb once - IV furosemide 20 mg once - oxycodone q4h prn  Zola Button, MD 08/14/2020, 10:08 PM PGY-1, [facility] pager [phone]

## 2020-08-15 ENCOUNTER — Inpatient Hospital Stay: Payer: Medicare PPO | Admitting: Family Medicine

## 2020-08-15 ENCOUNTER — Inpatient Hospital Stay: Payer: Medicare PPO

## 2020-08-15 DIAGNOSIS — I82421 Acute embolism and thrombosis of right iliac vein: Secondary | ICD-10-CM | POA: Diagnosis not present

## 2020-08-15 DIAGNOSIS — I739 Peripheral vascular disease, unspecified: Secondary | ICD-10-CM

## 2020-08-15 DIAGNOSIS — E1149 Type 2 diabetes mellitus with other diabetic neurological complication: Secondary | ICD-10-CM | POA: Diagnosis not present

## 2020-08-15 DIAGNOSIS — L03115 Cellulitis of right lower limb: Secondary | ICD-10-CM | POA: Diagnosis not present

## 2020-08-15 LAB — CBC
HCT: 26.4 % — ABNORMAL LOW (ref 39.0–52.0)
Hemoglobin: 8.6 g/dL — ABNORMAL LOW (ref 13.0–17.0)
MCH: 29.4 pg (ref 26.0–34.0)
MCHC: 32.6 g/dL (ref 30.0–36.0)
MCV: 90.1 fL (ref 80.0–100.0)
Platelets: 346 10*3/uL (ref 150–400)
RBC: 2.93 MIL/uL — ABNORMAL LOW (ref 4.22–5.81)
RDW: 13.2 % (ref 11.5–15.5)
WBC: 8.4 10*3/uL (ref 4.0–10.5)
nRBC: 0 % (ref 0.0–0.2)

## 2020-08-15 LAB — BASIC METABOLIC PANEL
Anion gap: 9 (ref 5–15)
BUN: 53 mg/dL — ABNORMAL HIGH (ref 8–23)
CO2: 25 mmol/L (ref 22–32)
Calcium: 8.4 mg/dL — ABNORMAL LOW (ref 8.9–10.3)
Chloride: 101 mmol/L (ref 98–111)
Creatinine, Ser: 1.78 mg/dL — ABNORMAL HIGH (ref 0.61–1.24)
GFR, Estimated: 41 mL/min — ABNORMAL LOW (ref >60)
Glucose, Bld: 155 mg/dL — ABNORMAL HIGH (ref 70–99)
Potassium: 4 mmol/L (ref 3.5–5.1)
Sodium: 135 mmol/L (ref 135–145)

## 2020-08-15 LAB — GLUCOSE, CAPILLARY
Glucose-Capillary: 137 mg/dL — ABNORMAL HIGH (ref 70–99)
Glucose-Capillary: 146 mg/dL — ABNORMAL HIGH (ref 70–99)
Glucose-Capillary: 149 mg/dL — ABNORMAL HIGH (ref 70–99)
Glucose-Capillary: 130 mg/dL — ABNORMAL HIGH (ref 70–99)

## 2020-08-15 LAB — PROTIME-INR
INR: 4 — ABNORMAL HIGH (ref 0.8–1.2)
Prothrombin Time: 38.7 seconds — ABNORMAL HIGH (ref 11.4–15.2)

## 2020-08-15 MED ORDER — ALBUTEROL SULFATE (2.5 MG/3ML) 0.083% IN NEBU
2.5000 mg | INHALATION_SOLUTION | Freq: Once | RESPIRATORY_TRACT | Status: AC
  Administered 2020-08-15: 2.5 mg via RESPIRATORY_TRACT
  Filled 2020-08-15: qty 3

## 2020-08-15 MED ORDER — ENOXAPARIN SODIUM 100 MG/ML ~~LOC~~ SOLN
100.0000 mg | Freq: Two times a day (BID) | SUBCUTANEOUS | Status: DC
  Administered 2020-08-15 – 2020-08-19 (×9): 100 mg via SUBCUTANEOUS
  Filled 2020-08-15 (×10): qty 1

## 2020-08-15 MED ORDER — ALBUTEROL SULFATE (2.5 MG/3ML) 0.083% IN NEBU
2.5000 mg | INHALATION_SOLUTION | Freq: Four times a day (QID) | RESPIRATORY_TRACT | Status: DC | PRN
  Administered 2020-08-16 – 2020-08-18 (×4): 2.5 mg via RESPIRATORY_TRACT
  Filled 2020-08-15 (×4): qty 3

## 2020-08-15 MED ORDER — WARFARIN - PHARMACIST DOSING INPATIENT: Freq: Every day | Status: DC

## 2020-08-15 MED ORDER — CEPHALEXIN 500 MG PO CAPS
500.0000 mg | ORAL_CAPSULE | Freq: Four times a day (QID) | ORAL | Status: DC
  Administered 2020-08-15 – 2020-08-19 (×16): 500 mg via ORAL
  Filled 2020-08-15 (×18): qty 1

## 2020-08-15 NOTE — Progress Notes (Addendum)
  Progress Note    08/15/2020 7:36 AM   Subjective:  Says he's doing ok right now.  Breathing ok.  Says his foot doesn't hurt.    Afebrile HR 72'C 947'S systolic 96% RA  Vitals:   08/15/20 0003 08/15/20 0423  BP: (!) 114/56 (!) 110/57  Pulse: 61 62  Resp: 20 18  Temp: 98.4 F (36.9 C) 98.5 F (36.9 C)  SpO2: 96% 97%    Physical Exam: General:  No distress Lungs:  Non labored Incisions:  Both incisions look good Extremities:  Right foot is warm with motor in tact.  Abdomen:  soft  CBC    Component Value Date/Time   WBC 8.4 08/15/2020 0133   RBC 2.93 (L) 08/15/2020 0133   HGB 8.6 (L) 08/15/2020 0133   HGB 12.4 (L) 02/15/2020 0912   HCT 26.4 (L) 08/15/2020 0133   HCT 36.8 (L) 02/15/2020 0912   PLT 346 08/15/2020 0133   PLT 281 02/15/2020 0912   MCV 90.1 08/15/2020 0133   MCV 91 02/15/2020 0912   MCH 29.4 08/15/2020 0133   MCHC 32.6 08/15/2020 0133   RDW 13.2 08/15/2020 0133   RDW 12.2 02/15/2020 0912   LYMPHSABS 1.3 04/03/2020 0956   MONOABS 0.3 04/03/2020 0956   EOSABS 0.0 04/03/2020 0956   BASOSABS 0.1 04/03/2020 0956    BMET    Component Value Date/Time   NA 135 08/15/2020 0133   NA 136 07/30/2020 1357   K 4.0 08/15/2020 0133   CL 101 08/15/2020 0133   CO2 25 08/15/2020 0133   GLUCOSE 155 (H) 08/15/2020 0133   BUN 53 (H) 08/15/2020 0133   BUN 40 (H) 07/30/2020 1357   CREATININE 1.78 (H) 08/15/2020 0133   CREATININE 1.19 10/09/2015 1554   CALCIUM 8.4 (L) 08/15/2020 0133   GFRNONAA 41 (L) 08/15/2020 0133   GFRNONAA 65 10/09/2015 1554   GFRAA 52 (L) 05/01/2020 1632   GFRAA 75 10/09/2015 1554    INR    Component Value Date/Time   INR 4.0 (H) 08/15/2020 0133     Intake/Output Summary (Last 24 hours) at 08/15/2020 0736 Last data filed at 08/15/2020 0500 Gross per 24 hour  Intake 740 ml  Output 1370 ml  Net -630 ml     Assessment:  70 y.o. male is s/p:  right SFA to below-knee popliteal artery bypass with saphenous vein graft due to  critical right lower extremity ischemia and wounds of the toes of the right foot.  This was performed by Dr. Donzetta Matters on 08/07/2020 readmitted with swelling and cellulitis.    Plan: -pt's right foot is warm and well perfused with motor in tact.  -incisions look good and healing. -pt with new DVT right CFV and SFJ-continue coumadin.  INR today is 4.  Management per primary team. -blood cx pending    Leontine Locket, PA-C [facility] [phone] 08/15/2020 7:36 AM  VASCULAR STAFF ADDENDUM: I have independently interviewed and examined the patient. I agree with the above.  Continue Coumadin for anticoagulation for DVT. Elevate and compress RLE. Continue antibiotics for cellulitis.  Yevonne Aline. [name], MD [facility] Phone Number: [phone] 08/15/2020 10:36 AM

## 2020-08-15 NOTE — Progress Notes (Addendum)
ANTICOAGULATION CONSULT NOTE - Follow-up Consult  Pharmacy Consult for warfarin and enoxaparin Indication: history of atrial fibrillation with new DVT  No Known Allergies  Patient Measurements: Height: 6' (182.9 cm) Weight: 110.8 kg (244 lb 4.3 oz) IBW/kg (Calculated) : 77.6  Vital Signs: Temp: 98.5 F (36.9 C) (04/21 0423) Temp Source: Oral (04/21 0423) BP: 110/57 (04/21 0423) Pulse Rate: 62 (04/21 0423)  Labs: Recent Labs    08/13/20 0050 08/14/20 0325 08/14/20 0916 08/15/20 0133  HGB 8.5* 9.7*  --  8.6*  HCT 26.5* 30.5*  --  26.4*  PLT 300 400  --  346  LABPROT 23.2*  --  34.1* 38.7*  INR 2.1*  --  3.4* 4.0*  CREATININE 2.05* 1.75*  --  1.78*    Estimated Creatinine Clearance: 51.1 mL/min (A) (by C-G formula based on SCr of 1.78 mg/dL (H)).   Medical History: Past Medical History:  Diagnosis Date  . CHF (congestive heart failure) ([place])   . Coronary artery disease   . Diabetes mellitus without complication ([place])   . HLD (hyperlipidemia)   . Hypertension   . Peripheral edema 02/24/2018  . SOB (shortness of breath) 07/26/2018    Assessment: 70 y/o M on warfarin for Afib recently discharged on 4/19 and re-admitted 4/20 with leg swelling/redness near surgical incision site. Warfarin held during previous admit 4/10-4/14 for R common femoral to below-knee popliteal bypass on 4/13 and resumed 4/14 with enoxaparin bridge. Pt had acute drop in Hgb with R groin hematoma but no overt bleeding on 4/16 - enoxaparin d/c'd and warfarin held at that time. Warfarin resumed 4/17, INR therapeutic at 2.1 4/19.  Patient re-admitted and noted to have DVT in R groin on ultrasound 4/20. INR today supratherapeutic at 4.0 despite missed dose 4/19 and warfarin hold 4/20. CHEST guidelines recommend treatment with parenteral anticoagulation for 5 days for DVT treatment despite supratherapeutic INRs on warfarin, MD OK with enoxaparin treatment. Hgb slightly decreased to 8.6. No current active  bleed issues reported.   PTA warfarin dose - 5 mg daily except 7.5 mg MWF  Goal of Therapy:  INR 2-3 Monitor platelets by anticoagulation protocol: Yes   Plan:  Hold warfarin today, will consider re-initiation when INRs trend down toward therapeutic range Initiate enoxaparin 100 mg Q12H (1 mg/kg) x5 days  Monitor daily INR, CBC, s/sx bleeding    Benna Dunks  PharmD Candidate, Class of 2022

## 2020-08-15 NOTE — Telephone Encounter (Signed)
Patient back in the hospital.

## 2020-08-15 NOTE — Progress Notes (Signed)
Family Medicine Teaching Service Daily Progress Note Intern Pager: [phone]  Patient name: Rodney [name] Medical record number: [id number] Date of birth: 03-12-1951 Age: 70 y.o. Gender: male  Primary Care Provider: [name], MD Consultants: vascular surgery Code Status: Full   Pt Overview and Major Events to Date:  4/21: Admitted   Assessment and Plan: JAQUALYN JUDAY is a 70 y.o. male presenting with right leg swelling and redness . 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  Right lower extremity swelling and erythema, suspect cellulitis Patient recently discharged 4/19 after being admitted for right lower extremity cellulitis. Treated with vancomycin and cefepime which was then transitioned to oral regimen (doxcycline and keflex) for which he competed a 10 day course. S/p stenting of right common iliac and s/p RLE superficial femoral to below knee popliteal artery bypass. Home diuretics held given AKI, admitted for lower extremity edema. Remained afebrile, thus far asymptomatic with CXR notable for cardiomegaly with pulmonary venous congestion.  -vascular surgery following, appreciate continued involvement and recs: no surgical intervention at this time - pending blood cultures -keflex 500 mg qid -tylenol 650 mg q6h prn for mild pain -oxycodone 5 mg q4h prn for severe pain - consider holding warfarin, will discuss with pharm -consider another lasix 20 mg dose -PT/OT eval and treat   DVT Ultrasound consistent with acute DVT of the right common femoral vein and SF junction. Patient denies chest pain and dyspnea.  -per pharmacy, 5 days therapeutic lovenox due to hypercoagulable state -hold home warfarin until INR 2-3 at goal  -consider CTA if worsening symptomatically   PAD S/P right superficial femoral to below-knee popliteal artery bypass  4/13 Worsening edema and erythema per above since last PM.  Discharged yesterday.   - vascular consulted per above - cont plavix, warfarin  AKI on CKD 3 Stable, Cr 1.78 which is essentially improved from prior hospitalization. Diuretics held on last discharge, given lasix 20 mg once. - continue to monitor BMP to ensure appropriate for continued diuretics  - cont to hold home losartan and spiro  HFpEF, Aortic Stenosis: Chronic, stable Last Echo 07/30/20, EF 50-55% with G2DD.  [facility].  Had been holding spiro, lasix, losartan in setting of AKI. Now with 2-3+ RLE pitting edema.  AKI improved per above.  Breathing comfortably on RA with appropriate sats.   - cont coreg BID - IV Lasix 20mg  x1, then reasses - BMP per above - cont to hold losartan and spiro, restart as able  Constipation Reports that he has not had a BM in 2 weeks.  Reports taking miralax three times in the last few days without improvement. - will give 1 dose senna and 1 dose miralax - consider tap water enema if does not improve  Hyponatremia: Chronic, stable Appears chronic, 128-133 is baseline.   - monitor BMP - Lasix per above, could consider hypervolemia as cause  Hypertension: Chronic, stable BP this morning110/57 - Continue home Coreg 12.5 mg twice daily - Holding losartan per above  PAF on Warfarin Currently supratherapeutic  with INR 4.  - discuss possible holding of warfarin with pharm - cont home amiodarone -monitor INR  Normocytic Anemia Hgb 8.6 this morning from 9.7 on admission. S/p acute blood loss anemia.   - monitor CBC  T2DM with neuropathy: Chronic CBG 137 this morning. Last A1c 6.5 on 07/03/2020.  Discharged on NovoLog 70/30  with 20u QAM, 10u QHS.    - CBGs with meals and nightly - holding home metformin - cont 70/30 20u QAM, 10u QHS  HLD  CAD Denies dyspnea and chest pain.  - cont home lipitor - home plavix  FEN/GI: heart healthy/carb modified  PPx:  Warfarin     Status is: Observation  The patient remains OBS appropriate and will d/c before 2 midnights.  Dispo:  Patient From: Home  Planned Disposition: Home  Medically stable for discharge: No       Subjective:  Overnight events included wheezing overnight. This morning patient denies any pain or dyspnea.   Objective: Temp:  [97.9 F (36.6 C)-98.5 F (36.9 C)] 98.5 F (36.9 C) (04/21 0423) Pulse Rate:  [56-65] 62 (04/21 0423) Resp:  [16-24] 18 (04/21 0423) BP: (100-138)/(55-69) 110/57 (04/21 0423) SpO2:  [92 %-99 %] 97 % (04/21 0423) Physical Exam: General: Patient sitting upright in bed, in no acute distress. Cardiovascular: RRR, 3/6 systolic murmur notable along left sternal border  Respiratory: CTAB, no rales or rhonchi noted, breathing comfortably on room air Abdomen: soft, nontender, BS+ Extremities: s/p LLE amputation, 1+ right pitting edema with no drainage or bleeding of incision site, surrounding erythema noted that extends few inches below patella  Psych: mood appropriate       Laboratory: Recent Labs  Lab 08/13/20 0050 08/14/20 0325 08/15/20 0133  WBC 5.7 9.5 8.4  HGB 8.5* 9.7* 8.6*  HCT 26.5* 30.5* 26.4*  PLT 300 400 346   Recent Labs  Lab 08/13/20 0050 08/14/20 0325 08/15/20 0133  NA 133* 130* 135  K 4.5 4.7 4.0  CL 102 96* 101  CO2 27 24 25   BUN 55* 53* 53*  CREATININE 2.05* 1.75* 1.78*  CALCIUM 8.4* 8.6* 8.4*  PROT  --  6.2*  --   BILITOT  --  1.0  --   ALKPHOS  --  75  --   ALT  --  18  --   AST  --  21  --   GLUCOSE 113* 174* 155*      Imaging/Diagnostic Tests: VAS [place] IVC/ILIAC (VENOUS ONLY)  Result Date: 08/14/2020 IVC/ILIAC STUDY Indications: Right CFV/SFJ DVT Limitations: Air/bowel gas, obesity and patient discomfort.  Performing Technologist: Maudry Mayhew MHA, RDMS, RVT, RDCS  Examination Guidelines: A complete evaluation includes B-mode imaging, spectral Doppler, color Doppler, and power Doppler as needed of all  accessible portions of each vessel. Bilateral testing is considered an integral part of a complete examination. Limited examinations for reoccurring indications may be performed as noted.  +-------------------+---------+-----------+---------+-----------+--------+         CIV        RT-PatentRT-ThrombusLT-PatentLT-ThrombusComments +-------------------+---------+-----------+---------+-----------+--------+ Common Iliac Prox   patent              patent                      +-------------------+---------+-----------+---------+-----------+--------+ Common Iliac Distal patent              patent                      +-------------------+---------+-----------+---------+-----------+--------+  +-------------------------+---------+-----------+---------+-----------+--------+            EIV           RT-PatentRT-ThrombusLT-PatentLT-ThrombusComments +-------------------------+---------+-----------+---------+-----------+--------+ External Iliac Vein                  acute    patent  Distal                                                                    +-------------------------+---------+-----------+---------+-----------+--------+   Summary: IVC/Iliac: Acute DVT noted in the right external iliac vein at the right groin. Remaining visualized iliac veins are patent. Unable to visualize the IVC due to overlying bowel gas.  *See table(s) above for measurements and observations.  Electronically signed by Jamelle Haring on 08/14/2020 at 4:36:10 PM.   Final    DG Chest Portable 1 View  Result Date: 08/14/2020 CLINICAL DATA:  Edema. EXAM: PORTABLE CHEST 1 VIEW COMPARISON:  04/03/2020. FINDINGS: Cardiomegaly with pulmonary venous congestion. Mild bilateral interstitial prominence. Mild CHF cannot be excluded. Left-sided pleural thickening again noted. No pleural effusion or pneumothorax. Costophrenic angles incompletely imaged. IMPRESSION: Cardiomegaly with pulmonary  venous congestion. Mild bilateral interstitial prominence. Mild CHF cannot be excluded. Electronically Signed   By: Marcello Moores  Register   On: 08/14/2020 09:54   VAS [place] LOWER EXTREMITY VENOUS (DVT)  Result Date: 08/14/2020  Lower Venous DVT Study Indications: Edema, Erythema, and recent right lower extremity bypass, recent right lower extremity cellulitis.  Limitations: Body habitus, poor ultrasound/tissue interface, open wound and pain. Comparison Study: 09/27/2019 negative left lower extremity venous duplex Performing Technologist: Maudry Mayhew MHA, RDMS, RVT, RDCS  Examination Guidelines: A complete evaluation includes B-mode imaging, spectral Doppler, color Doppler, and power Doppler as needed of all accessible portions of each vessel. Bilateral testing is considered an integral part of a complete examination. Limited examinations for reoccurring indications may be performed as noted. The reflux portion of the exam is performed with the patient in reverse Trendelenburg.  +---------+---------------+---------+-----------+----------+--------------+ RIGHT    CompressibilityPhasicitySpontaneityPropertiesThrombus Aging +---------+---------------+---------+-----------+----------+--------------+ CFV      None                    No                   Acute          +---------+---------------+---------+-----------+----------+--------------+ SFJ      None                                         Acute          +---------+---------------+---------+-----------+----------+--------------+ FV Prox  Full                                                        +---------+---------------+---------+-----------+----------+--------------+ FV Mid   Full                                                        +---------+---------------+---------+-----------+----------+--------------+ FV DistalFull                                                         +---------+---------------+---------+-----------+----------+--------------+  POP      Full           Yes      Yes                                 +---------+---------------+---------+-----------+----------+--------------+ PTV      Full                                                        +---------+---------------+---------+-----------+----------+--------------+   Right Technical Findings: Not visualized segments include PFV, peroneal veins.  +----+---------------+---------+-----------+----------+--------------+ LEFTCompressibilityPhasicitySpontaneityPropertiesThrombus Aging +----+---------------+---------+-----------+----------+--------------+ CFV Full           Yes      Yes                                 +----+---------------+---------+-----------+----------+--------------+     Summary: RIGHT: - Findings consistent with acute deep vein thrombosis involving the right common femoral vein, and SF junction. - No cystic structure found in the popliteal fossa.  LEFT: - No evidence of common femoral vein obstruction.  *See table(s) above for measurements and observations. Electronically signed by Jamelle Haring on 08/14/2020 at 4:36:30 PM.    Final     Donney Dice, DO 08/15/2020, 6:57 AM PGY-1, [facility] Intern pager: [phone], text pages welcome

## 2020-08-15 NOTE — Care Management Obs Status (Signed)
[place] NOTIFICATION   Patient Details  Name: DERREK PUFF MRN: [id number] Date of Birth: 08/27/1950   Medicare Observation Status Notification Given:  Yes    Angelita Ingles, RN 08/15/2020, 9:11 AM

## 2020-08-16 ENCOUNTER — Inpatient Hospital Stay: Payer: Medicare PPO

## 2020-08-16 DIAGNOSIS — R609 Edema, unspecified: Secondary | ICD-10-CM | POA: Diagnosis not present

## 2020-08-16 DIAGNOSIS — I82421 Acute embolism and thrombosis of right iliac vein: Secondary | ICD-10-CM | POA: Diagnosis not present

## 2020-08-16 DIAGNOSIS — E1149 Type 2 diabetes mellitus with other diabetic neurological complication: Secondary | ICD-10-CM | POA: Diagnosis not present

## 2020-08-16 DIAGNOSIS — L03115 Cellulitis of right lower limb: Secondary | ICD-10-CM | POA: Diagnosis not present

## 2020-08-16 LAB — CBC
HCT: 25.4 % — ABNORMAL LOW (ref 39.0–52.0)
HCT: 24.2 % — ABNORMAL LOW (ref 39.0–52.0)
Hemoglobin: 8 g/dL — ABNORMAL LOW (ref 13.0–17.0)
Hemoglobin: 8 g/dL — ABNORMAL LOW (ref 13.0–17.0)
MCH: 29.2 pg (ref 26.0–34.0)
MCH: 30 pg (ref 26.0–34.0)
MCHC: 31.5 g/dL (ref 30.0–36.0)
MCHC: 33.1 g/dL (ref 30.0–36.0)
MCV: 92.7 fL (ref 80.0–100.0)
MCV: 90.6 fL (ref 80.0–100.0)
Platelets: 354 10*3/uL (ref 150–400)
Platelets: 316 10*3/uL (ref 150–400)
RBC: 2.74 MIL/uL — ABNORMAL LOW (ref 4.22–5.81)
RBC: 2.67 MIL/uL — ABNORMAL LOW (ref 4.22–5.81)
RDW: 13.5 % (ref 11.5–15.5)
RDW: 13.3 % (ref 11.5–15.5)
WBC: 7.6 10*3/uL (ref 4.0–10.5)
WBC: 7.8 10*3/uL (ref 4.0–10.5)
nRBC: 0 % (ref 0.0–0.2)
nRBC: 0 % (ref 0.0–0.2)

## 2020-08-16 LAB — BASIC METABOLIC PANEL
Anion gap: 7 (ref 5–15)
BUN: 48 mg/dL — ABNORMAL HIGH (ref 8–23)
CO2: 23 mmol/L (ref 22–32)
Calcium: 8.3 mg/dL — ABNORMAL LOW (ref 8.9–10.3)
Chloride: 104 mmol/L (ref 98–111)
Creatinine, Ser: 1.51 mg/dL — ABNORMAL HIGH (ref 0.61–1.24)
GFR, Estimated: 50 mL/min — ABNORMAL LOW (ref >60)
Glucose, Bld: 128 mg/dL — ABNORMAL HIGH (ref 70–99)
Potassium: 4.5 mmol/L (ref 3.5–5.1)
Sodium: 134 mmol/L — ABNORMAL LOW (ref 135–145)

## 2020-08-16 LAB — GLUCOSE, CAPILLARY
Glucose-Capillary: 128 mg/dL — ABNORMAL HIGH (ref 70–99)
Glucose-Capillary: 129 mg/dL — ABNORMAL HIGH (ref 70–99)
Glucose-Capillary: 126 mg/dL — ABNORMAL HIGH (ref 70–99)
Glucose-Capillary: 128 mg/dL — ABNORMAL HIGH (ref 70–99)

## 2020-08-16 LAB — PROTIME-INR
INR: 2.2 — ABNORMAL HIGH (ref 0.8–1.2)
Prothrombin Time: 24.4 seconds — ABNORMAL HIGH (ref 11.4–15.2)

## 2020-08-16 MED ORDER — SILVER SULFADIAZINE 1 % EX CREA
TOPICAL_CREAM | Freq: Every day | CUTANEOUS | Status: DC
  Filled 2020-08-16: qty 85

## 2020-08-16 MED ORDER — ACETAMINOPHEN 500 MG PO TABS
1000.0000 mg | ORAL_TABLET | Freq: Four times a day (QID) | ORAL | Status: DC
  Administered 2020-08-16 – 2020-08-19 (×11): 1000 mg via ORAL
  Filled 2020-08-16 (×13): qty 2

## 2020-08-16 MED ORDER — OXYCODONE HCL 5 MG PO TABS
5.0000 mg | ORAL_TABLET | Freq: Four times a day (QID) | ORAL | Status: DC
  Administered 2020-08-16: 5 mg via ORAL
  Filled 2020-08-16: qty 1

## 2020-08-16 MED ORDER — OXYCODONE HCL 5 MG PO TABS
10.0000 mg | ORAL_TABLET | Freq: Four times a day (QID) | ORAL | Status: DC
  Administered 2020-08-16 – 2020-08-19 (×12): 10 mg via ORAL
  Filled 2020-08-16 (×12): qty 2

## 2020-08-16 MED ORDER — WARFARIN SODIUM 5 MG PO TABS
5.0000 mg | ORAL_TABLET | Freq: Once | ORAL | Status: AC
  Administered 2020-08-16: 5 mg via ORAL
  Filled 2020-08-16: qty 1

## 2020-08-16 MED ORDER — OXYCODONE HCL 5 MG PO TABS: 5.0000 mg | ORAL_TABLET | ORAL | Status: DC | PRN

## 2020-08-16 NOTE — TOC Initial Note (Signed)
Transition of Care [facility]) - Initial/Assessment Note    Patient Details  Name: Rodney [name] MRN: [id number] Date of Birth: 07-Nov-1950  Transition of Care [facility]) CM/SW Contact:    Verdell Carmine, RN Phone Number: 08/16/2020, 10:32 AM  Clinical Narrative:                 Readmission, is active with [facility] for home health.  Vascular is consulted.  Just set up with resources on last admission ( DC 4/14) CM will continue to follow for additional needs.   Expected Discharge Plan: [place] Barriers to Discharge: Continued Medical Work up   Patient Goals and CMS Choice        Expected Discharge Plan and Services Expected Discharge Plan: [place]   Discharge Planning Services: CM Consult   Living arrangements for the past 2 months: Single Family Home                                      Prior Living Arrangements/Services Living arrangements for the past 2 months: Single Family Home Lives with:: Significant Other Patient language and need for interpreter reviewed:: Yes        Need for Family Participation in Patient Care: Yes (Comment) Care giver support system in place?: Yes (comment)   Criminal Activity/Legal Involvement Pertinent to Current Situation/Hospitalization: No - Comment as needed  Activities of Daily Living Home Assistive Devices/Equipment: Prosthesis,Walker (specify type),Wheelchair,Cane (specify quad or straight) ADL Screening (condition at time of admission) Patient's cognitive ability adequate to safely complete daily activities?: Yes Is the patient deaf or have difficulty hearing?: Yes Does the patient have difficulty seeing, even when wearing glasses/contacts?: No Does the patient have difficulty concentrating, remembering, or making decisions?: No Patient able to express need for assistance with ADLs?: Yes Does the patient have difficulty dressing or bathing?: Yes Independently performs ADLs?:  No Dressing (OT): Needs assistance Bathing: Needs assistance Does the patient have difficulty walking or climbing stairs?: Yes Weakness of Legs: Right Weakness of Arms/Hands: None  Permission Sought/Granted                  Emotional Assessment       Orientation: : Oriented to Self,Oriented to Place,Oriented to  Time,Oriented to Situation Alcohol / Substance Use: Not Applicable Psych Involvement: No (comment)  Admission diagnosis:  Peripheral edema [R60.9] Cellulitis of right lower extremity [L03.115] Infection of superficial incisional surgical site after procedure, initial encounter [T81.41XA] Patient Active Problem List   Diagnosis Date Noted  . Cellulitis of right lower extremity 08/14/2020  . Acute deep vein thrombosis (DVT) of iliac vein of right lower extremity (HCC)   . Acute on chronic anemia   . Ascending aorta dilation ([place]) 08/04/2020  . Gangrene of toe of right foot ([place]) 08/04/2020  . Infective myositis of right foot   . Cellulitis of foot, right 08/01/2020  . 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 CAD (coronary artery disease) 02/15/2020  . CRI (chronic renal insufficiency), stage 3 (moderate) ([place]) 02/15/2020  . Long term (current) use of anticoagulants 12/29/2019  . S/P BKA (below knee amputation) unilateral, left ([place]) 11/14/2019  . High risk social situation 11/14/2019  . Atrial fibrillation ([place]) 10/03/2019  . HFrEF (heart failure with reduced ejection fraction) ([place]) 09/28/2019  . Moderate aortic stenosis 09/28/2019  . Anemia in chronic illness 09/27/2019  . PVD (peripheral vascular disease) (  [place])   . Umbilical hernia 65/46/5035  . Obesity 10/09/2015  . Tobacco abuse 09/07/2013  . DM (diabetes mellitus), type 2 with neurological complications ([place]) 46/56/8127  . Hypercholesteremia 10/28/2010  . ERECTILE DYSFUNCTION 05/22/2009  . Essential hypertension 01/23/2009   PCP:  [name], MD Pharmacy:   [place], [place] - [address] [place] [address] Phone: [phone] Fax: [facility] [place], [place] [address] Phone: [phone] Fax: [phone]  [place]), [place] - [address] [place] ([place]) [place] [address] Phone: [phone] Fax: [phone]     Social Determinants of Health (SDOH) Interventions    Readmission Risk Interventions Readmission Risk Prevention Plan 08/13/2020  Transportation Screening Complete  PCP or Specialist Appt within 3-5 Days Complete  HRI or Home Care Consult Complete  Social Work Consult for [place] Planning/Counseling Complete  Palliative Care Screening Not Applicable  Medication Review Press photographer) Complete  Some recent data might be hidden

## 2020-08-16 NOTE — Progress Notes (Signed)
   VASCULAR SURGERY ASSESSMENT & PLAN:   POD 9 RIGHT FEMORAL POPLITEAL BYPASS WITH VEIN: His bypass is patent.  He has brisk Doppler signals in the right foot.  He has some purplish-colored bruising of his lower leg incision and this does not appear infected.  RLE DVT with edema and bullous lesions of lower leg and ankle: On Lovenox 100 mg BID. Discussed Silvadene ointment to these areas with RN.  Also instructed how ACE wrap should be placed with graded compression. Continue RLE elevation.  VASCULAR QUALITY INITIATIVE: He is on a statin.  He is not on aspirin as an outpatient because he is on Coumadin.  He is on Plavix.   SUBJECTIVE:   Continues with complaints of pain.  PHYSICAL EXAM:   Vitals:   08/15/20 1147 08/15/20 1712 08/15/20 2310 08/16/20 0440  BP: 116/66 123/63 108/60 (!) 113/59  Pulse: (!) 59 65 (!) 58 (!) 59  Resp: 18 18 17 19   Temp: 98.5 F (36.9 C) 98.9 F (37.2 C) 98.5 F (36.9 C) 98.4 F (36.9 C)  TempSrc: Oral Oral Oral Oral  SpO2: 97% 97% 97% 96%  Weight:      Height:       General appearance: Awake, alert in no apparent distress Cardiac: Heart rate and rhythm are regular Respirations: Nonlabored Incisions: Right groin, thigh and lower leg incisions are all well approximated without bleeding or hematoma. There is some dried serosanguineous drainage of lower leg incision that I cleaned with water. Extremities: Right foot warm with intact sensation and motor function.  He had an ACE wrap around his ankle with 2+ edema of dorsum of foot and lower leg above the ACE wrap.  De-roofed bullous lesions of lateral aspect of right distal lower leg and medial ankle.        LABS:   Lab Results  Component Value Date   WBC 7.6 08/16/2020   HGB 8.0 (L) 08/16/2020   HCT 25.4 (L) 08/16/2020   MCV 92.7 08/16/2020   PLT 354 08/16/2020   Lab Results  Component Value Date   CREATININE 1.51 (H) 08/16/2020   Lab Results  Component Value Date   INR 2.2 (H)  08/16/2020   CBG (last 3)  Recent Labs    08/15/20 1630 08/15/20 2107 08/16/20 0600  GLUCAP 149* 130* 128*    PROBLEM LIST:    Active Problems:   DM (diabetes mellitus), type 2 with neurological complications (HCC)   PVD (peripheral vascular disease) (HCC)   Cellulitis of right lower extremity   Acute deep vein thrombosis (DVT) of iliac vein of right lower extremity (HCC)   CURRENT MEDS:   . amiodarone  200 mg Oral Daily  . atorvastatin  80 mg Oral Daily  . carvedilol  12.5 mg Oral BID WC  . cephALEXin  500 mg Oral QID  . clopidogrel  75 mg Oral Daily  . enoxaparin (LOVENOX) injection  100 mg Subcutaneous Q12H  . insulin aspart protamine- aspart  10 Units Subcutaneous Q supper  . insulin aspart protamine- aspart  20 Units Subcutaneous Q breakfast  . polyethylene glycol  17 g Oral Daily  . senna-docusate  1 tablet Oral Daily  . silver sulfADIAZINE   Topical Daily  . Warfarin - Pharmacist Dosing Inpatient   Does not apply [place], PA-C Office: [phone] 08/16/2020

## 2020-08-16 NOTE — Progress Notes (Addendum)
Family Medicine Teaching Service Daily Progress Note Intern Pager: [phone]  Patient name: Rodney [name] Medical record number: [id number] Date of birth: [birth year] Age: 70 y.o. Gender: male  Primary Care Provider: [name], MD Consultants: vascular surgery  Code Status: Full  Pt Overview and Major Events to Date:  4/21: Admitted   Assessment and Plan: Rodney [name] a 70 y.o.malepresenting with right leg swelling and redness. PMH is significant for left BKA due tonecrotizing fasciitis, HTN, T2DM with neuropathy, HFpEF previously HFrEF, moderate aortic stenosis, PAF on warfarin, CKD3a.  Right lower extremity swelling and erythema, suspect cellulitis Stable. Patient endorsing increased pain. Notably significant improvement with LE edema and erythema. Patient recently discharged 4/19 after being admitted for right lower extremity cellulitis. Treated with vancomycin and cefepime which was then transitioned to oral regimen (doxcycline and keflex) for which he competed a 10 day course. S/p stenting of right common iliac and s/p RLE superficial femoral to below knee popliteal artery bypass. Home diuretics held given AKI, admitted for lower extremity edema. Remained afebrile, thus far asymptomatic with CXR notable for cardiomegaly with pulmonary venous congestion.  -vascular surgery following, appreciate continued involvement and recs: no surgical intervention at this time - pending blood cultures -keflex 500 mg qid -tylenol 650 mg q6h  -oxycodone 5 mg q6h  -oxycodone 5 mg for breakthrough pain  -warfarin per pharmacy  -consider another lasix 20 mg dose if appropriate  -wound care -PT/OT eval and treat   DVT Ultrasound consistent with acute DVT of the right common femoral vein and SF junction. Patient denies chest pain and dyspnea.  -per pharmacy, 5 days therapeutic lovenox due to hypercoagulable state -warfarin per pharmacy  -consider CTA if worsening  symptomatically   PAD  S/pright superficial femoral to below-knee popliteal artery bypass 4/13 Edema improved. - vascular consulted per above - continue plavix   AKI on CKD 3 Stable, which is essentially improved from prior hospitalization.Diuretics held on last discharge, given lasix 20 mg once initially on admission. - continue to monitor BMP to ensure appropriate for continued diuretics  - cont to hold home losartan and spiro  HFpEF, Aortic Stenosis: Chronic, stable Last Echo 07/30/20, EF 50-55% with G2DD. [facility]. S/p lasix 20 mg.  - continue coreg 12.5 mg bid - BMP per above - cont to hold losartan and spiro, restart as able  Constipation - miralax - senna  Hyponatremia: Chronic, stable Appears chronic, 128-133 is baseline. Possibly due to hypovolemia given on lasix.  - pending BMP  Hypertension: Chronic, stable BP this morning 113/59. - Continue home Coreg 12.5 mg twice daily - Holding losartan per above  PAF Home medications include warfarin. Currently INR 2.2 at goal. - warfarin per pharm - continue home amiodarone -monitor INR  Normocytic Anemia Hgb 8.0 most recently from 9.7 on admission. S/pacute blood loss anemia.  - recheck pm CBC -transfusion threshold 8 given hx of CAD  T2DM with neuropathy: Chronic CBG 128 this morning. Last A1c 6.5 on 07/03/2020. Discharged on NovoLog 70/30 with 20u QAM, 10u QHS.   - CBGs with meals and nightly - holding home metformin - cont 70/30 20u QAM, 10u QHS  HLD  CAD Denies dyspnea and chest pain.  - cont home lipitor - home plavix   FEN/GI: heart healthy/carb modified  PPx: therapeutic lovenox    Status is: Observation  The patient remains OBS appropriate and will d/c before 2 midnights.  Dispo:  Patient From: Home  Planned Disposition: Home  Medically  stable for discharge: No       Subjective:  No acute events reported overnight. Patient denies chest pain and dyspnea.  Endorsing right LE pain this morning. Denies any concerns otherwise.   Objective: Temp:  [98.4 F (36.9 C)-98.9 F (37.2 C)] 98.4 F (36.9 C) (04/22 0440) Pulse Rate:  [58-65] 59 (04/22 0440) Resp:  [17-19] 19 (04/22 0440) BP: (108-123)/(59-66) 113/59 (04/22 0440) SpO2:  [96 %-97 %] 96 % (04/22 0440) Physical Exam: General: Patient sitting upright in bed, in no acute distress. Cardiovascular: RRR, 3/6 systolic murmur noted more prominently along left sternal border Respiratory: CTAB, no rales or rhonchi noted, breathing comfortably on room air  Abdomen: soft, nontender, BS+ Extremities: s/p LLE amputation, 1+ right pitting edema, significantly improved erythema almost not noticeable, radial and right distal pulses present  Psych: mood appropriate    Laboratory: Recent Labs  Lab 08/13/20 0050 08/14/20 0325 08/15/20 0133  WBC 5.7 9.5 8.4  HGB 8.5* 9.7* 8.6*  HCT 26.5* 30.5* 26.4*  PLT 300 400 346   Recent Labs  Lab 08/13/20 0050 08/14/20 0325 08/15/20 0133  NA 133* 130* 135  K 4.5 4.7 4.0  CL 102 96* 101  CO2 27 24 25   BUN 55* 53* 53*  CREATININE 2.05* 1.75* 1.78*  CALCIUM 8.4* 8.6* 8.4*  PROT  --  6.2*  --   BILITOT  --  1.0  --   ALKPHOS  --  75  --   ALT  --  18  --   AST  --  21  --   GLUCOSE 113* 174* 155*      Imaging/Diagnostic Tests: No results found.  Donney Dice, DO 08/16/2020, 6:29 AM PGY-1, [place] Intern pager: [phone], text pages welcome

## 2020-08-16 NOTE — Progress Notes (Addendum)
ANTICOAGULATION CONSULT NOTE - Follow-up Consult  Pharmacy Consult for warfarin and enoxaparin Indication: history of atrial fibrillation with new DVT  No Known Allergies  Patient Measurements: Height: 6' (182.9 cm) Weight: 110.8 kg (244 lb 4.3 oz) IBW/kg (Calculated) : 77.6  Vital Signs: Temp: 98.4 F (36.9 C) (04/22 0440) Temp Source: Oral (04/22 0440) BP: 113/59 (04/22 0440) Pulse Rate: 59 (04/22 0440)  Labs: Recent Labs    08/14/20 0325 08/14/20 0916 08/15/20 0133 08/16/20 0614  HGB 9.7*  --  8.6* 8.0*  HCT 30.5*  --  26.4* 25.4*  PLT 400  --  346 354  LABPROT  --  34.1* 38.7* 24.4*  INR  --  3.4* 4.0* 2.2*  CREATININE 1.75*  --  1.78* 1.51*    Estimated Creatinine Clearance: 60.2 mL/min (A) (by C-G formula based on SCr of 1.51 mg/dL (H)).   Medical History: Past Medical History:  Diagnosis Date  . CHF (congestive heart failure) ([place])   . Coronary artery disease   . Diabetes mellitus without complication ([place])   . HLD (hyperlipidemia)   . Hypertension   . Peripheral edema 02/24/2018  . SOB (shortness of breath) 07/26/2018    Assessment: 70 y/o M on warfarin for Afib recently discharged on 4/19 and re-admitted 4/20 with new DVT in the setting of warfarin hold 4/10-4/14 for R common femoral to below-knee popliteal bypass on 4/13. Warfarin resumed 4/14 with enoxaparin bridge. Enoxaparin d/c'd and warfarin held 4/16 d/t R groin hematoma and Hgb drop. Warfarin resumed 4/17.  INR today therapeutic at 2.2 after missed dose 4/19 and warfarin hold 4/20 for supratherapeutic INR of 4 on 4/21. Continuing enoxaparin bridge for 5 days per CHEST guidelines. INR therapeutic today so appropriate to restart warfarin. Hgb decreased to 8.0, plts stable at 354. No current active bleed issues reported. Repeat CBC this afternoon.  PTA warfarin dose - 5 mg daily except 7.5 mg MWF  Goal of Therapy:  INR 2-3 Monitor platelets by anticoagulation protocol: Yes   Plan:  Administer  warfarin 5 mg today Continue enoxaparin 100 mg Q12H for 5 total days  F/U repeat CBC this afternoon Monitor daily INR, CBC, s/sx bleeding    Benna Dunks  PharmD Candidate, Class of 2022

## 2020-08-16 NOTE — Progress Notes (Addendum)
Family Medicine Teaching Service Daily Progress Note Intern Pager: [phone]  Patient name: Rodney [name] Medical record number: [id number] Date of birth: 25-May-1950 Age: 70 y.o. Gender: male  Primary Care Provider: [name], MD Consultants: vascular surgery   Code Status: Full Code  Pt Overview and Major Events to Date:  4/21 admitted  Assessment and Plan: Rodney [name] is a 70 y.o. male presenting with right leg swelling and redness found to have DVT. PMH is significant for left BKA due tonecrotizing fasciitis, HTN, T2DM with neuropathy, HFpEF previously HFrEF, moderate aortic stenosis, PAF on warfarin, CKD3a.  Right lower extremity DVT Confirmed on DVT ultrasound.  Bridging with LMWH due to hypercoagulable state with warfarin.  - therapeutic LMWH x 5 days (4/21-) - warfarin per pharmacy - elevate RLE - consider CTA if clinical worsening  Right lower extremity cellulitis Improving with oral antibiotics. Blood cx NGTD 2 days. - cephalexin 500 mg QID - f/u blood cx  PAD S/p right superficial femoral to below-knee popliteal bypass surgery on 4/13.  Pain well controlled on current regimen. - vascular surgery following, appreciate recommendations - scheduled APAP 1000 mg q6h - oxycodone 10 mg q6h scheduled - oxycodone 5 mg q4h prn breakthrough - clopidogrel  AKI superimposed on CKD stage IIIa Has been improving. - f/u AM BMP - monitor on BMP  HFpEF Last Echo 07/30/20, EF 50-55% with G2DD. Slightly volume up on exam. - continue carvedilol 12.5 mg BID - restart losartan 12.5 mg daily (half his home dose) - restart home furosemide 40 mg - restart home spironolactone 25 mg  Normocytic anemia Hemoglobin levels 8 yesterday close to transfusion threshold. - f/u CBC, transfusion if indicated - transfusion threshold 8  Constipation Had BM yesterday 4/22. - senna-docusate daily - Miralax daily  HTN Normotensive. - continue carvedilol - holding  losartan  PAF On warfarin for anticoagulation. - warfarin per pharmacy - continue amiodarone - f/u INR - monitor INR  T2DM Most recent A1c 6.5 on 07/03/2020. Adequately controlled on current regimen.  - 70/30 insulin 20 units AM, 10 units qhs  HLD  CAD - continue statin - continue clopidogrel  FEN/GI: heart healthy/carb modified diet PPx: therapeutic LMWH + warfarin  Disposition: med-surg, home when medically stable  Subjective:  NAOE.  Patient reports pain is better controlled today on current regimen.  Denies any active bleeding.  Reports he had a large BM yesterday.  Reports feeling a little short of breath and asking for another breathing treatment.  Objective: Temp:  [97.9 F (36.6 C)-99.1 F (37.3 C)] 98.8 F (37.1 C) (04/23 0509) Pulse Rate:  [55-60] 55 (04/23 0509) Resp:  [16-19] 16 (04/23 0509) BP: (108-122)/(58-66) 116/60 (04/23 0509) SpO2:  [94 %-97 %] 96 % (04/23 0509) Physical Exam: General: Obese male, resting in bed comfortably, NAD Cardiovascular: RRR, 3/6 systolic murmur at the LUSB Respiratory: Bibasilar rales, no wheezes, breathing comfortably on room air Abdomen: Soft, nontender, positive bowel sounds Extremities: 1+ pitting edema to the right ankle, s/p left BKA, see image below:         Laboratory: Recent Labs  Lab 08/15/20 0133 08/16/20 0614 08/16/20 1509  WBC 8.4 7.6 7.8  HGB 8.6* 8.0* 8.0*  HCT 26.4* 25.4* 24.2*  PLT 346 354 316   Recent Labs  Lab 08/14/20 0325 08/15/20 0133 08/16/20 0614  NA 130* 135 134*  K 4.7 4.0 4.5  CL 96* 101 104  CO2 24 25 23   BUN 53* 53* 48*  CREATININE 1.75* 1.78*  1.51*  CALCIUM 8.6* 8.4* 8.3*  PROT 6.2*  --   --   BILITOT 1.0  --   --   ALKPHOS 75  --   --   ALT 18  --   --   AST 21  --   --   GLUCOSE 174* 155* 128*     Imaging/Diagnostic Tests: No new imaging.  Zola Button, MD 08/17/2020, 8:06 AM PGY-1, [place] Intern pager: [phone], text pages welcome

## 2020-08-17 DIAGNOSIS — M79606 Pain in leg, unspecified: Secondary | ICD-10-CM

## 2020-08-17 DIAGNOSIS — E1149 Type 2 diabetes mellitus with other diabetic neurological complication: Secondary | ICD-10-CM | POA: Diagnosis not present

## 2020-08-17 DIAGNOSIS — D62 Acute posthemorrhagic anemia: Secondary | ICD-10-CM

## 2020-08-17 DIAGNOSIS — L03115 Cellulitis of right lower limb: Secondary | ICD-10-CM | POA: Diagnosis not present

## 2020-08-17 DIAGNOSIS — I82421 Acute embolism and thrombosis of right iliac vein: Secondary | ICD-10-CM | POA: Diagnosis not present

## 2020-08-17 LAB — GLUCOSE, CAPILLARY
Glucose-Capillary: 117 mg/dL — ABNORMAL HIGH (ref 70–99)
Glucose-Capillary: 92 mg/dL (ref 70–99)
Glucose-Capillary: 128 mg/dL — ABNORMAL HIGH (ref 70–99)
Glucose-Capillary: 101 mg/dL — ABNORMAL HIGH (ref 70–99)

## 2020-08-17 LAB — CBC
HCT: 25.6 % — ABNORMAL LOW (ref 39.0–52.0)
Hemoglobin: 8.1 g/dL — ABNORMAL LOW (ref 13.0–17.0)
MCH: 28.9 pg (ref 26.0–34.0)
MCHC: 31.6 g/dL (ref 30.0–36.0)
MCV: 91.4 fL (ref 80.0–100.0)
Platelets: 344 10*3/uL (ref 150–400)
RBC: 2.8 MIL/uL — ABNORMAL LOW (ref 4.22–5.81)
RDW: 13.2 % (ref 11.5–15.5)
WBC: 6.5 10*3/uL (ref 4.0–10.5)
nRBC: 0 % (ref 0.0–0.2)

## 2020-08-17 LAB — PROTIME-INR
INR: 2.2 — ABNORMAL HIGH (ref 0.8–1.2)
Prothrombin Time: 24.1 seconds — ABNORMAL HIGH (ref 11.4–15.2)

## 2020-08-17 MED ORDER — FUROSEMIDE 40 MG PO TABS
40.0000 mg | ORAL_TABLET | Freq: Every day | ORAL | Status: DC
  Administered 2020-08-17 – 2020-08-19 (×3): 40 mg via ORAL
  Filled 2020-08-17 (×3): qty 1

## 2020-08-17 MED ORDER — SPIRONOLACTONE 25 MG PO TABS
25.0000 mg | ORAL_TABLET | Freq: Every day | ORAL | Status: DC
  Administered 2020-08-17 – 2020-08-19 (×3): 25 mg via ORAL
  Filled 2020-08-17 (×3): qty 1

## 2020-08-17 MED ORDER — LOSARTAN POTASSIUM 25 MG PO TABS
12.5000 mg | ORAL_TABLET | Freq: Every day | ORAL | Status: DC
  Administered 2020-08-17 – 2020-08-19 (×3): 12.5 mg via ORAL
  Filled 2020-08-17 (×3): qty 1

## 2020-08-17 MED ORDER — WARFARIN SODIUM 5 MG PO TABS
5.0000 mg | ORAL_TABLET | Freq: Once | ORAL | Status: AC
  Administered 2020-08-17: 5 mg via ORAL
  Filled 2020-08-17: qty 1

## 2020-08-17 NOTE — Progress Notes (Signed)
Seen on rounds today. Right leg warm and well perfused. Cellulitis continues to improve.  Continue coumadin for DVT. Continue ABx for cellulitis. Please call for questions.  Yevonne Aline. [name], MD [facility] Phone Number: [phone] 08/17/2020 10:22 AM

## 2020-08-17 NOTE — Progress Notes (Signed)
Family Medicine Teaching Service Daily Progress Note Intern Pager: [phone]  Patient name: Rodney [name] Medical record number: [id number] Date of birth: 1950-08-10 Age: 70 y.o. Gender: male  Primary Care Provider: [name], MD Consultants: vascular surger Code Status: Full  Pt Overview and Major Events to Date:  4/21 - admitted with DVT  Assessment and Plan: Rodney [name] is a 70 y.o. male presenting with right leg swelling and redness found to have DVT. PMH is significant for left BKA due tonecrotizing fasciitis, HTN, T2DM with neuropathy, HFpEF previously HFrEF, moderate aortic stenosis, PAF on warfarin, CKD3a.  Right lower extremity DVT Stable. Currently undergoing bridging with LMWH due to hypercoagulable state with warfarin.  - therapeutic LMWH x 5 days (4/21-) - warfarin per pharmacy - elevate RLE - consider CTA if clinical worsening - Tylenol 1g q6 hours for pain + Oxy 10mg  q6h with Oxy 5mg  q4 PRN  Right lower extremity cellulitis Improving with oral antibiotics. Blood cx NGTD. - cephalexin 500 mg QID (4/21-) - f/u blood cx  PAD Stable. S/p right superficial femoral to below-knee popliteal bypass surgery on 4/13.  Pain well controlled on current regimen. - vascular surgery following, appreciate recommendations - scheduled APAP 1000 mg q6h - oxycodone 10 mg q6h scheduled - oxycodone 5 mg q4h prn breakthrough - clopidogrel  AKI superimposed on CKD stage IIIa Stable. Cr 1.5 today. Baseline is unclear, range from 1.1-1.5 - monitor on BMP - avoid nephrogenic agents  HFpEF Last Echo 07/30/20, EF 50-55% with G2DD. Appears euvolemic on exam. UOP 1.25L overnight. - continue carvedilol 12.5 mg BID - losartan 12.5 mg daily (half his home dose) - home furosemide 40 mg - home spironolactone 25 mg  Normocytic anemia Hgb 8.1>7.8 this AM. Suspect iatrogenic from daily blood draws. No signs of acute bleed. Patient agreeable to blood transfusion.  Transfusion threshold <8. - type and screen - transfuse 1U pRBC  Constipation - senna-docusate daily - Miralax daily  HTN Normotensive. -continue BP meds  Paroxysmal A-fib On warfarin for anticoagulation. - warfarin per pharmacy - continue amiodarone - f/u INR - monitor INR  T2DM Most recent A1c 6.5 on 07/03/2020. Adequately controlled on current regimen.  - 70/30 insulin 20 units AM, 10 units qhs  HLD  CAD - continue statin - continue clopidogrel  FEN/GI: heart healthy/carb modified diet PPx: therapeutic LMWH + warfarin  Disposition: med-surg, home when medically stable  Subjective:  No acute events overnight. Denies any pain. Bowel movement the other day, normal is 1-2 x/week. Eating, drinking, voiding normally.  Objective: Temp:  [98.4 F (36.9 C)-98.9 F (37.2 C)] 98.5 F (36.9 C) (04/24 0444) Pulse Rate:  [56-61] 61 (04/24 0444) Resp:  [16-18] 18 (04/24 0444) BP: (115-129)/(58-68) 129/58 (04/24 0444) SpO2:  [91 %-94 %] 94 % (04/24 0444) Physical Exam: General: pleasant older man, lying comfortably in bed, well nourished, well developed, in no acute distress with non-toxic appearance CV: regular rate and rhythm, harsh 3/6 systolic murmur at left upper sternal border no lower extremity edema Lungs: some mild bibasilar rales with normal work of breathing on room air, speaking in full sentences Abdomen: soft, non-tender, non-distended, normoactive bowel sounds Skin/Extremities: warm, dry, s/p left BKA, bandage clean and intact along RLE, incision well healing Neuro: Alert and oriented, speech normal  Laboratory: Recent Labs  Lab 08/16/20 1509 08/17/20 1054 08/18/20 0328  WBC 7.8 6.5 6.0  HGB 8.0* 8.1* 7.8*  HCT 24.2* 25.6* 24.9*  PLT 316 344 388   Recent Labs  Lab 08/14/20 0325 08/15/20 0133 08/16/20 0614 08/18/20 0328  NA 130* 135 134* 134*  K 4.7 4.0 4.5 4.5  CL 96* 101 104 101  CO2 24 25 23 26   BUN 53* 53* 48* 44*  CREATININE 1.75*  1.78* 1.51* 1.53*  CALCIUM 8.6* 8.4* 8.3* 8.5*  PROT 6.2*  --   --   --   BILITOT 1.0  --   --   --   ALKPHOS 75  --   --   --   ALT 18  --   --   --   AST 21  --   --   --   GLUCOSE 174* 155* 128* 91    PT/INR: 28.3/2.7  Imaging/Diagnostic Tests: No results found.   Rodney [name] [place], DO 08/18/2020, 6:33 AM PGY-3, Rodney [name] Intern pager: [phone], text pages welcome

## 2020-08-17 NOTE — Progress Notes (Signed)
Wound care completed. Pt refused ace bandage placement

## 2020-08-17 NOTE — Progress Notes (Signed)
Dressing changed per order completed. Pain medication admin per request. Patient requires 2 assistance with dres. Changes and c/o minimal pain.

## 2020-08-17 NOTE — Progress Notes (Signed)
ANTICOAGULATION CONSULT NOTE - Follow-up Consult  Pharmacy Consult for warfarin and enoxaparin Indication: history of atrial fibrillation with new DVT  No Known Allergies  Patient Measurements: Height: 6' (182.9 cm) Weight: 110.8 kg (244 lb 4.3 oz) IBW/kg (Calculated) : 77.6  Vital Signs: Temp: 98.8 F (37.1 C) (04/23 0509) Temp Source: Oral (04/23 0509) BP: 116/60 (04/23 0509) Pulse Rate: 55 (04/23 0509)  Labs: Recent Labs    08/15/20 0133 08/16/20 0614 08/16/20 1509 08/17/20 1054  HGB 8.6* 8.0* 8.0* 8.1*  HCT 26.4* 25.4* 24.2* 25.6*  PLT 346 354 316 344  LABPROT 38.7* 24.4*  --  24.1*  INR 4.0* 2.2*  --  2.2*  CREATININE 1.78* 1.51*  --   --     Estimated Creatinine Clearance: 60.2 mL/min (A) (by C-G formula based on SCr of 1.51 mg/dL (H)).   Medical History: Past Medical History:  Diagnosis Date  . CHF (congestive heart failure) ([place])   . Coronary artery disease   . Diabetes mellitus without complication ([place])   . HLD (hyperlipidemia)   . Hypertension   . Peripheral edema 02/24/2018  . SOB (shortness of breath) 07/26/2018    Assessment: 70 y/o M on warfarin for Afib recently discharged on 4/19 and re-admitted 4/20 with new DVT in the setting of warfarin hold 4/10-4/14 for R common femoral to below-knee popliteal bypass on 4/13. Warfarin resumed 4/14 with enoxaparin bridge. Enoxaparin d/c'd and warfarin held 4/16 d/t R groin hematoma and Hgb drop. Warfarin resumed 4/17.  INR today therapeutic at 2.2 after missed dose 4/19 and warfarin hold 4/20 for supratherapeutic INR of 4 on 4/21. Hgb stable ~8, plts stable at 344. No current active bleed issues reported.   PTA warfarin dose - 5 mg daily except 7.5 mg MWF  Goal of Therapy:  INR 2-3 Monitor platelets by anticoagulation protocol: Yes   Plan:  Administer warfarin 5 mg today Continue enoxaparin 100 mg Q12H for 5 total days  F/U repeat CBC this afternoon Monitor daily INR, CBC, s/sx bleeding    Cephus Slater, PharmD, [facility] Pharmacy Resident [phone] 08/17/2020 11:55 AM

## 2020-08-17 NOTE — Progress Notes (Signed)
RN offered to ambulate patient in chair, he declined. He voices no pain or concerns at this time.

## 2020-08-18 DIAGNOSIS — E1149 Type 2 diabetes mellitus with other diabetic neurological complication: Secondary | ICD-10-CM | POA: Diagnosis present

## 2020-08-18 DIAGNOSIS — L03115 Cellulitis of right lower limb: Secondary | ICD-10-CM | POA: Diagnosis present

## 2020-08-18 DIAGNOSIS — N179 Acute kidney failure, unspecified: Secondary | ICD-10-CM | POA: Diagnosis not present

## 2020-08-18 DIAGNOSIS — E785 Hyperlipidemia, unspecified: Secondary | ICD-10-CM | POA: Diagnosis present

## 2020-08-18 DIAGNOSIS — D62 Acute posthemorrhagic anemia: Secondary | ICD-10-CM | POA: Diagnosis present

## 2020-08-18 DIAGNOSIS — Z833 Family history of diabetes mellitus: Secondary | ICD-10-CM | POA: Diagnosis not present

## 2020-08-18 DIAGNOSIS — I2510 Atherosclerotic heart disease of native coronary artery without angina pectoris: Secondary | ICD-10-CM | POA: Diagnosis present

## 2020-08-18 DIAGNOSIS — Z20822 Contact with and (suspected) exposure to covid-19: Secondary | ICD-10-CM | POA: Diagnosis present

## 2020-08-18 DIAGNOSIS — F17210 Nicotine dependence, cigarettes, uncomplicated: Secondary | ICD-10-CM | POA: Diagnosis present

## 2020-08-18 DIAGNOSIS — Z89512 Acquired absence of left leg below knee: Secondary | ICD-10-CM | POA: Diagnosis not present

## 2020-08-18 DIAGNOSIS — Z8249 Family history of ischemic heart disease and other diseases of the circulatory system: Secondary | ICD-10-CM | POA: Diagnosis not present

## 2020-08-18 DIAGNOSIS — Z7984 Long term (current) use of oral hypoglycemic drugs: Secondary | ICD-10-CM | POA: Diagnosis not present

## 2020-08-18 DIAGNOSIS — Z7902 Long term (current) use of antithrombotics/antiplatelets: Secondary | ICD-10-CM | POA: Diagnosis not present

## 2020-08-18 DIAGNOSIS — Z794 Long term (current) use of insulin: Secondary | ICD-10-CM | POA: Diagnosis not present

## 2020-08-18 DIAGNOSIS — Z7901 Long term (current) use of anticoagulants: Secondary | ICD-10-CM | POA: Diagnosis not present

## 2020-08-18 DIAGNOSIS — L02415 Cutaneous abscess of right lower limb: Secondary | ICD-10-CM | POA: Diagnosis present

## 2020-08-18 DIAGNOSIS — Z811 Family history of alcohol abuse and dependence: Secondary | ICD-10-CM | POA: Diagnosis not present

## 2020-08-18 DIAGNOSIS — N1831 Chronic kidney disease, stage 3a: Secondary | ICD-10-CM | POA: Diagnosis present

## 2020-08-18 DIAGNOSIS — I82411 Acute embolism and thrombosis of right femoral vein: Secondary | ICD-10-CM | POA: Diagnosis present

## 2020-08-18 DIAGNOSIS — I130 Hypertensive heart and chronic kidney disease with heart failure and stage 1 through stage 4 chronic kidney disease, or unspecified chronic kidney disease: Secondary | ICD-10-CM | POA: Diagnosis present

## 2020-08-18 DIAGNOSIS — E871 Hypo-osmolality and hyponatremia: Secondary | ICD-10-CM | POA: Diagnosis present

## 2020-08-18 DIAGNOSIS — R609 Edema, unspecified: Secondary | ICD-10-CM | POA: Diagnosis present

## 2020-08-18 DIAGNOSIS — I82421 Acute embolism and thrombosis of right iliac vein: Secondary | ICD-10-CM | POA: Diagnosis present

## 2020-08-18 DIAGNOSIS — I5042 Chronic combined systolic (congestive) and diastolic (congestive) heart failure: Secondary | ICD-10-CM | POA: Diagnosis present

## 2020-08-18 DIAGNOSIS — E7800 Pure hypercholesterolemia, unspecified: Secondary | ICD-10-CM | POA: Diagnosis present

## 2020-08-18 LAB — BASIC METABOLIC PANEL
Anion gap: 7 (ref 5–15)
BUN: 44 mg/dL — ABNORMAL HIGH (ref 8–23)
CO2: 26 mmol/L (ref 22–32)
Calcium: 8.5 mg/dL — ABNORMAL LOW (ref 8.9–10.3)
Chloride: 101 mmol/L (ref 98–111)
Creatinine, Ser: 1.53 mg/dL — ABNORMAL HIGH (ref 0.61–1.24)
GFR, Estimated: 49 mL/min — ABNORMAL LOW (ref >60)
Glucose, Bld: 91 mg/dL (ref 70–99)
Potassium: 4.5 mmol/L (ref 3.5–5.1)
Sodium: 134 mmol/L — ABNORMAL LOW (ref 135–145)

## 2020-08-18 LAB — GLUCOSE, CAPILLARY
Glucose-Capillary: 86 mg/dL (ref 70–99)
Glucose-Capillary: 119 mg/dL — ABNORMAL HIGH (ref 70–99)
Glucose-Capillary: 151 mg/dL — ABNORMAL HIGH (ref 70–99)
Glucose-Capillary: 151 mg/dL — ABNORMAL HIGH (ref 70–99)

## 2020-08-18 LAB — PROTIME-INR
INR: 2.7 — ABNORMAL HIGH (ref 0.8–1.2)
Prothrombin Time: 28.3 seconds — ABNORMAL HIGH (ref 11.4–15.2)

## 2020-08-18 LAB — CBC
HCT: 24.9 % — ABNORMAL LOW (ref 39.0–52.0)
Hemoglobin: 7.8 g/dL — ABNORMAL LOW (ref 13.0–17.0)
MCH: 28.6 pg (ref 26.0–34.0)
MCHC: 31.3 g/dL (ref 30.0–36.0)
MCV: 91.2 fL (ref 80.0–100.0)
Platelets: 388 10*3/uL (ref 150–400)
RBC: 2.73 MIL/uL — ABNORMAL LOW (ref 4.22–5.81)
RDW: 13.3 % (ref 11.5–15.5)
WBC: 6 10*3/uL (ref 4.0–10.5)
nRBC: 0 % (ref 0.0–0.2)

## 2020-08-18 LAB — HEMOGLOBIN AND HEMATOCRIT, BLOOD
HCT: 28.7 % — ABNORMAL LOW (ref 39.0–52.0)
Hemoglobin: 9.2 g/dL — ABNORMAL LOW (ref 13.0–17.0)

## 2020-08-18 LAB — PREPARE RBC (CROSSMATCH)

## 2020-08-18 MED ORDER — SODIUM CHLORIDE 0.9% IV SOLUTION: Freq: Once | INTRAVENOUS | Status: DC

## 2020-08-18 MED ORDER — WARFARIN SODIUM 4 MG PO TABS
4.0000 mg | ORAL_TABLET | Freq: Once | ORAL | Status: AC
  Administered 2020-08-18: 4 mg via ORAL
  Filled 2020-08-18: qty 1

## 2020-08-18 NOTE — Progress Notes (Signed)
ANTICOAGULATION CONSULT NOTE - Follow-up Consult  Pharmacy Consult for warfarin and enoxaparin Indication: history of atrial fibrillation with new DVT  No Known Allergies  Patient Measurements: Height: 6' (182.9 cm) Weight: 110.8 kg (244 lb 4.3 oz) IBW/kg (Calculated) : 77.6  Vital Signs: Temp: 98.5 F (36.9 C) (04/24 0444) Temp Source: Oral (04/24 0444) BP: 129/58 (04/24 0444) Pulse Rate: 61 (04/24 0444)  Labs: Recent Labs    08/16/20 0614 08/16/20 1509 08/17/20 1054 08/18/20 0328  HGB 8.0* 8.0* 8.1* 7.8*  HCT 25.4* 24.2* 25.6* 24.9*  PLT 354 316 344 388  LABPROT 24.4*  --  24.1* 28.3*  INR 2.2*  --  2.2* 2.7*  CREATININE 1.51*  --   --  1.53*    Estimated Creatinine Clearance: 59.4 mL/min (A) (by C-G formula based on SCr of 1.53 mg/dL (H)).   Medical History: Past Medical History:  Diagnosis Date  . CHF (congestive heart failure) ([place])   . Coronary artery disease   . Diabetes mellitus without complication ([place])   . HLD (hyperlipidemia)   . Hypertension   . Peripheral edema 02/24/2018  . SOB (shortness of breath) 07/26/2018    Assessment: 70 y/o M on warfarin for Afib recently discharged on 4/19 and re-admitted 4/20 with new DVT in the setting of warfarin hold 4/10-4/14 for R common femoral to below-knee popliteal bypass on 4/13. Warfarin resumed 4/14 with enoxaparin bridge. Enoxaparin d/c'd and warfarin held 4/16 d/t R groin hematoma and Hgb drop. Warfarin resumed 4/17.  INR today is therapeutic at 2.7. Hgb slight downtrend to 7.8, physician aware and ordering 1U pRBC. Drop in Hgb believed to be iatrogenic from repeated blood draws. No current active bleed issues reported. Of note, INR increased from 2.2 to 2.7.   PTA warfarin dose - 5 mg daily except 7.5 mg MWF  Goal of Therapy:  INR 2-3 Monitor platelets by anticoagulation protocol: Yes   Plan:  Administer warfarin 4 mg today Continue enoxaparin 100 mg Q12H for 5 total days (end 08/19/20) F/U repeat  CBC this afternoon Monitor daily INR, CBC, s/sx bleeding    Cephus Slater, PharmD, [facility] Pharmacy Resident [phone] 08/18/2020 7:15 AM

## 2020-08-18 NOTE — Hospital Course (Addendum)
Rodney [name] is a 70 y.o. male presenting with right leg swelling and redness . 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   Right lower extremity swelling and erythema, suspect cellulitis Patient admitted to hospital after being discharged less than a day prior to admission. Presented with right lower extremity edema and erythema with vitals stable and afebrile status. S/p stenting of right common iliac (4/11) and RLE superficial femoral to below-knee popliteal artery bypass (4/13). Placed on vancomycin and cefepime initially then transitioned to keflex. Blood cultures did not demonstrate any growth of 3 days. Vascular surgery consulted and recommended no further surgical intervention but advised IV antibiotics and lasix.   Right lower extremity DVT Noted on DVT US. Started on 5 day course of therapeutic lovenox. INR 4 at supratherapeutic range on admission, and warfarin held. A day later became at goal around 2.1, warfarin restarted per pharmacy.  AKI superimposed on CKD stage IIIa Home losartan, furosemide, and spironolactone were initially held given elevated serum creatinine.  These were restarted (losartan was restarted at 12.5 mg, half of his prior dose) when creatinine improved.  On day of discharge, creatinine was 1.68.  Baseline unclear, ranged from 1.1-1.5.  HFpEF As above, losartan, furosemide, and spironolactone were initially held given AKI but these were restarted prior to discharge (losartan was restarted at 12.5 mg daily which is half of his home dose).  Subacute blood loss anemia No clear source, likely multifactorial with possible iatrogenic component from blood draws.  Patient received 1 unit of pRBC on 4/24 for hemoglobin of 7.8 for a hemoglobin threshold of 8.  On day of discharge, hemoglobin was stable at 8.9.  All other issues chronic and  stable.  Issues for follow up:  Recheck CBC, Hgb although stable is close to transfusion threshold of 8.  Will discharge patient on Oxycodone 10mg  four times daily PRN. Vascular surgery follow-up scheduled, ensure patient goes to appointment.

## 2020-08-19 ENCOUNTER — Other Ambulatory Visit (HOSPITAL_COMMUNITY): Payer: Self-pay

## 2020-08-19 LAB — CBC
HCT: 27.9 % — ABNORMAL LOW (ref 39.0–52.0)
Hemoglobin: 8.9 g/dL — ABNORMAL LOW (ref 13.0–17.0)
MCH: 29.2 pg (ref 26.0–34.0)
MCHC: 31.9 g/dL (ref 30.0–36.0)
MCV: 91.5 fL (ref 80.0–100.0)
Platelets: 353 10*3/uL (ref 150–400)
RBC: 3.05 MIL/uL — ABNORMAL LOW (ref 4.22–5.81)
RDW: 13.3 % (ref 11.5–15.5)
WBC: 6.3 10*3/uL (ref 4.0–10.5)
nRBC: 0 % (ref 0.0–0.2)

## 2020-08-19 LAB — BASIC METABOLIC PANEL
Anion gap: 5 (ref 5–15)
BUN: 36 mg/dL — ABNORMAL HIGH (ref 8–23)
CO2: 29 mmol/L (ref 22–32)
Calcium: 8.3 mg/dL — ABNORMAL LOW (ref 8.9–10.3)
Chloride: 99 mmol/L (ref 98–111)
Creatinine, Ser: 1.68 mg/dL — ABNORMAL HIGH (ref 0.61–1.24)
GFR, Estimated: 44 mL/min — ABNORMAL LOW (ref >60)
Glucose, Bld: 126 mg/dL — ABNORMAL HIGH (ref 70–99)
Potassium: 4.5 mmol/L (ref 3.5–5.1)
Sodium: 133 mmol/L — ABNORMAL LOW (ref 135–145)

## 2020-08-19 LAB — CULTURE, BLOOD (ROUTINE X 2)
Culture: NO GROWTH
Culture: NO GROWTH
Special Requests: ADEQUATE

## 2020-08-19 LAB — PROTIME-INR
INR: 2.8 — ABNORMAL HIGH (ref 0.8–1.2)
Prothrombin Time: 29.7 seconds — ABNORMAL HIGH (ref 11.4–15.2)

## 2020-08-19 LAB — TYPE AND SCREEN
ABO/RH(D): A POS
Antibody Screen: NEGATIVE
Unit division: 0

## 2020-08-19 LAB — BPAM RBC
Blood Product Expiration Date: 202205132359
ISSUE DATE / TIME: 202204241121
Unit Type and Rh: 6200

## 2020-08-19 LAB — GLUCOSE, CAPILLARY
Glucose-Capillary: 126 mg/dL — ABNORMAL HIGH (ref 70–99)
Glucose-Capillary: 103 mg/dL — ABNORMAL HIGH (ref 70–99)

## 2020-08-19 MED ORDER — METFORMIN HCL 1000 MG PO TABS
1000.0000 mg | ORAL_TABLET | Freq: Every day | ORAL | 0 refills | Status: DC
  Filled 2020-08-19: qty 30, 30d supply, fill #0

## 2020-08-19 MED ORDER — OXYCODONE HCL 10 MG PO TABS
10.0000 mg | ORAL_TABLET | Freq: Four times a day (QID) | ORAL | 0 refills | Status: AC | PRN
  Filled 2020-08-19: qty 28, 7d supply, fill #0

## 2020-08-19 MED ORDER — WARFARIN SODIUM 5 MG PO TABS
5.0000 mg | ORAL_TABLET | Freq: Every day | ORAL | 0 refills | Status: DC
  Filled 2020-08-19: qty 60, 60d supply, fill #0

## 2020-08-19 MED ORDER — FUROSEMIDE 40 MG PO TABS
40.0000 mg | ORAL_TABLET | Freq: Every day | ORAL | 0 refills | Status: DC
  Filled 2020-08-19: qty 30, 30d supply, fill #0

## 2020-08-19 MED ORDER — ACETAMINOPHEN 500 MG PO TABS
1000.0000 mg | ORAL_TABLET | Freq: Four times a day (QID) | ORAL | 0 refills | Status: DC
  Filled 2020-08-19: qty 30, 4d supply, fill #0

## 2020-08-19 MED ORDER — OXYCODONE HCL 10 MG PO TABS
10.0000 mg | ORAL_TABLET | Freq: Four times a day (QID) | ORAL | 0 refills | Status: DC
  Filled 2020-08-19: qty 28, 7d supply, fill #0

## 2020-08-19 MED ORDER — SPIRONOLACTONE 25 MG PO TABS
25.0000 mg | ORAL_TABLET | Freq: Every day | ORAL | 0 refills | Status: DC
  Filled 2020-08-19: qty 30, 30d supply, fill #0

## 2020-08-19 MED ORDER — LOSARTAN POTASSIUM 25 MG PO TABS
12.5000 mg | ORAL_TABLET | Freq: Every day | ORAL | 0 refills | Status: DC
  Filled 2020-08-19: qty 30, 60d supply, fill #0

## 2020-08-19 MED ORDER — CEPHALEXIN 500 MG PO CAPS
500.0000 mg | ORAL_CAPSULE | Freq: Four times a day (QID) | ORAL | 0 refills | Status: AC
  Filled 2020-08-19: qty 16, 4d supply, fill #0

## 2020-08-19 NOTE — Progress Notes (Signed)
Pt IV removed, catheter intact. Pt has all belongings and understands d/c instructions. Pt d/c via wheelchair with NT.

## 2020-08-19 NOTE — Discharge Summary (Signed)
[facility] Discharge Summary  Patient name: Rodney [name] Medical record number: [id number] Date of birth: [birth year] Age: 70 y.o. Gender: male Date of Admission: 08/14/2020  Date of Discharge: 08/19/2020  Admitting Physician: Kinnie Feil, MD  Primary Care Provider: [name], MD Consultants: Vascular surgery  Indication for Hospitalization: DVT  Discharge Diagnoses/Problem List:  Active Problems:   DM (diabetes mellitus), type 2 with neurological complications ([place])   PVD (peripheral vascular disease) ([place])   Cellulitis of right lower extremity   Acute deep vein thrombosis (DVT) of iliac vein of right lower extremity ([employer])   Cellulitis and abscess of right lower extremity   CKD stage IIIa   HFpEF   Normocytic anemia   Constipation   HTN   PAF   CAD   HLD  Disposition: home  Discharge Condition: Stable  Discharge Exam:  General: Obese elderly male, NAD CV: RRR, harsh 3/6 systolic murmur at the LUSB Pulm: Faint bibasilar rales, breathing comfortably on room air Abd: Soft, nontender, positive bowel sounds Ext: WWP, s/p left BKA, RLE wrapped clean and intact, incision sites CDI, 2+ pitting edema up to ankle   Brief Hospital Course:  Rodney [name] is a 70 y.o. male presenting with right leg swelling and redness . 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   Right lower extremity swelling and erythema, suspect cellulitis Patient admitted to hospital after being discharged less than a day prior to admission. Presented with right lower extremity edema and erythema with vitals stable and afebrile status. S/p stenting of right common iliac (4/11) and RLE superficial femoral to below-knee popliteal artery bypass (4/13). Placed on vancomycin and cefepime initially then transitioned to keflex. Blood cultures did not  demonstrate any growth of 3 days. Vascular surgery consulted and recommended no further surgical intervention but advised IV antibiotics and lasix.   Right lower extremity DVT Noted on DVT US. Started on 5 day course of therapeutic lovenox. INR 4 at supratherapeutic range on admission, and warfarin held. A day later became at goal around 2.1, warfarin restarted per pharmacy.  AKI superimposed on CKD stage IIIa Home losartan, furosemide, and spironolactone were initially held given elevated serum creatinine.  These were restarted (losartan was restarted at 12.5 mg, half of his prior dose) when creatinine improved.  On day of discharge, creatinine was 1.68.  Baseline unclear, ranged from 1.1-1.5.  HFpEF As above, losartan, furosemide, and spironolactone were initially held given AKI but these were restarted prior to discharge (losartan was restarted at 12.5 mg daily which is half of his home dose).  Subacute blood loss anemia No clear source, likely multifactorial with possible iatrogenic component from blood draws.  Patient received 1 unit of pRBC on 4/24 for hemoglobin of 7.8 for a hemoglobin threshold of 8.  On day of discharge, hemoglobin was stable at 8.9.  All other issues chronic and stable.  Issues for follow up:  1. Recheck CBC, Hgb although stable is close to transfusion threshold of 8.  2. Will discharge patient on Oxycodone 37m four times daily PRN. 3. Vascular surgery follow-up scheduled, ensure patient goes to appointment.    Significant Procedures: none  Significant Labs and Imaging:  Recent Labs  Lab 08/17/20 1054 08/18/20 0328 08/18/20 1850 08/19/20 1017  WBC 6.5 6.0  --  6.3  HGB 8.1* 7.8* 9.2* 8.9*  HCT 25.6* 24.9* 28.7*  27.9*  PLT 344 388  --  353   Recent Labs  Lab 08/14/20 0325 08/15/20 0133 08/16/20 0614 08/18/20 0328 08/19/20 0849  NA 130* 135 134* 134* 133*  K 4.7 4.0 4.5 4.5 4.5  CL 96* 101 104 101 99  CO2 24 25 23 26 29   GLUCOSE 174* 155* 128*  91 126*  BUN 53* 53* 48* 44* 36*  CREATININE 1.75* 1.78* 1.51* 1.53* 1.68*  CALCIUM 8.6* 8.4* 8.3* 8.5* 8.3*  ALKPHOS 75  --   --   --   --   AST 21  --   --   --   --   ALT 18  --   --   --   --   ALBUMIN 2.5*  --   --   --   --     CT ABDOMEN PELVIS WO CONTRAST  Result Date: 08/10/2020 CLINICAL DATA:  Anemia, concern for intra-abdominal bleed EXAM: CT ABDOMEN AND PELVIS WITHOUT CONTRAST TECHNIQUE: Multidetector CT imaging of the abdomen and pelvis was performed following the standard protocol without IV contrast. COMPARISON:  None. FINDINGS: Lower chest: Small bilateral pleural effusions. Bibasilar atelectasis. Dense mitral valve annular calcifications. Hepatobiliary: No focal hepatic abnormality. Gallbladder unremarkable. Pancreas: No focal abnormality or ductal dilatation. Spleen: No focal abnormality.  Normal size. Adrenals/Urinary Tract: No adrenal abnormality. No focal renal abnormality. No stones or hydronephrosis. Urinary bladder is unremarkable. Stomach/Bowel: Normal appendix. Stomach, large and small bowel grossly unremarkable. Vascular/Lymphatic: Aortoiliac atherosclerosis. No evidence of aneurysm or adenopathy. No retroperitoneal hematoma. Reproductive: Mildly prominent prostate. Other: No free fluid or free air. Postoperative changes noted in the right groin region. 4.0 x 3.3 cm slightly hyperdense soft tissue noted anterior to the vessels in the upper thigh and right groin region, likely small postoperative hematoma. Musculoskeletal: No acute bony abnormality. IMPRESSION: No retroperitoneal hematoma. Postoperative changes in the right groin. 4 x 3.3 cm hematoma seen in the right groin/upper thigh anterior to the vessels. Diffuse aortoiliac atherosclerosis. Mildly prominent prostate. Electronically Signed   By: Rolm Baptise M.D.   On: 08/10/2020 22:34   MR FOOT RIGHT W WO CONTRAST  Result Date: 08/02/2020 CLINICAL DATA:  Pain and swelling and large dorsal foot blister. EXAM: MRI OF THE  RIGHT FOREFOOT WITHOUT AND WITH CONTRAST TECHNIQUE: Multiplanar, multisequence MR imaging of the right foot was performed before and after the administration of intravenous contrast. CONTRAST:  61m GADAVIST GADOBUTROL 1 MMOL/ML IV SOLN COMPARISON:  Radiographs 09/26/2019 FINDINGS: Marked dorsal subcutaneous soft tissue swelling/edema/fluid and associated skin blistering. I do not however see any discrete rim enhancing fluid collection to suggest a drainable abscess. No obvious gas is seen in the soft tissues. Diffuse myositis involving the foot musculature but no evidence of pyomyositis. The bony structures are intact. No findings suspicious for septic arthritis or osteomyelitis. Suspect remote fracture involving the proximal phalanx of the fifth toe. The major tendons and ligaments appear intact. IMPRESSION: 1. Marked dorsal subcutaneous soft tissue swelling/edema/fluid and associated skin blistering. No discrete rim enhancing fluid collection to suggest a drainable abscess. 2. Diffuse myositis involving the foot musculature but no evidence of pyomyositis. 3. No findings for septic arthritis or osteomyelitis. Electronically Signed   By: PMarijo SanesM.D.   On: 08/02/2020 21:55   PERIPHERAL VASCULAR CATHETERIZATION  Result Date: 08/05/2020 Patient name: MLUISANTONIO ADINOLFI       MRN: [id number]       DOB: [birth year]       Sex: male  08/01/2020 - 08/05/2020 Pre-operative Diagnosis: Critical limb ischemia of the right lower extremity with tissue loss Post-operative diagnosis:  Same Surgeon:  Marty Heck, MD Procedure Performed: 1.  Ultrasound guided access of left common femoral artery 2.  Aortogram including catheter selection of aorta 3.  Right lower extremity arteriogram with selection of third order branches 4.  Right common iliac artery angioplasty with stent placement (8 mm x 29 mm VBX) 5.  52 minutes of monitored moderate conscious sedation time  Indications: 70 year old male who was seen over the  weekend by Dr. Scot Dock in consultation with tissue loss in the right lower extremity.  He had severely depressed ABIs of 0.57 monophasic at the ankle.  He presents today for arteriogram and possible intervention after risk benefits discussed.  Findings:  Aortogram showed a calcified but patent infrarenal aorta with patent renal arteries bilaterally.  On the left he had a 50% common iliac calcified stenosis.  On the right he had a high-grade greater than 80% calcified common iliac stenosis that appeared flow limiting.  The right external iliac artery was patent.  Right lower extremity runoff showed a patent common femoral and profunda with a diffusely diseased SFA with multiple subtotal occlusions from calcification.  He has an occluded above-knee popliteal artery and ultimately reconstituted a below-knee popliteal artery with a patent trifurcation.  The below knee popliteal artery and TP trunk does look mild to moderately diseased.  Dominant runoff appeared to be through the anterior tibial and posterior tibial artery given the peroneal appeared to have a mid calf occlusion.  The anterior tibial did occlude above the ankle with dominant runoff into the foot through the PT.  The right common iliac artery high-grade stenosis was stented with an 8 mm x 29 mm VBX.  He will need evaluation for right femoropopliteal bypass             Procedure:  The patient was identified in the holding area and taken to room 8.  The patient was then placed supine on the table and prepped and draped in the usual sterile fashion.  A time out was called.  Ultrasound was used to evaluate the left common femoral artery.  It was patent .  A digital ultrasound image was acquired.  A micropuncture needle was used to access the left common femoral artery under ultrasound guidance.  An 018 wire was advanced without resistance and a micropuncture sheath was placed.  The 018 wire was removed and a benson wire was placed.  The micropuncture  sheath was exchanged for a 5 french sheath.  An omniflush catheter was advanced over the wire to the level of L-1.  An abdominal angiogram was obtained.  Next, using the omniflush catheter and a benson wire, the aortic bifurcation was crossed and the catheter was placed into theright external iliac artery and right runoff was obtained.  Pertinent findings noted above but after evaluating images, I thought he would need a right common femoral to likely below-knee popliteal bypass.  Given a high-grade calcified right common iliac stenosis greater than 80% we elected to treat this.  Ultimately used a Glidewire advantage to exchange for a 7 [place] Ansell sheath over the aortic bifurcation and across the right common iliac stenosis. The patient was given 100 units/kg heparin IV. I then placed an 8 mm x 29 mm VBX in the right common iliac and pulled the sheath back.  We got multiple hand injections to identify the location of the lesion then  the stent was deployed and overinflated to get a total diameter of 8.4 mm which was the diameter of the normal-appearing artery proximal and distal to the stenosis.  The distal common iliac did appear ectatic.  With excellent results with no residual stenosis and stent widely deployed.  Ultimately I did pull the sheath back and got a left iliac shot to make sure that I did not occluded flow in the left and he still has about a 50% calcified stenosis that we did not treat given the left leg is asymptomatic.  Wires and catheters were removed.  The sheath was pulled back.  Taken to holding to have the sheath removed.  Plan: The right common iliac high-grade stenosis was stented with a 8 mm x 29 mm VBX.  He will need evaluation for possible right common femoral to below-knee popliteal bypass.  Vein mapping is ordered.  Will discuss with Dr. Scot Dock.   Marty Heck, MD [facility] Office: [phone]   VAS [place] IVC/ILIAC (VENOUS ONLY)  Result  Date: 08/14/2020 IVC/ILIAC STUDY Indications: Right CFV/SFJ DVT Limitations: Air/bowel gas, obesity and patient discomfort.  Performing Technologist: Maudry Mayhew MHA, RDMS, RVT, RDCS  Examination Guidelines: A complete evaluation includes B-mode imaging, spectral Doppler, color Doppler, and power Doppler as needed of all accessible portions of each vessel. Bilateral testing is considered an integral part of a complete examination. Limited examinations for reoccurring indications may be performed as noted.  +-------------------+---------+-----------+---------+-----------+--------+         CIV        RT-PatentRT-ThrombusLT-PatentLT-ThrombusComments +-------------------+---------+-----------+---------+-----------+--------+ Common Iliac Prox   patent              patent                      +-------------------+---------+-----------+---------+-----------+--------+ Common Iliac Distal patent              patent                      +-------------------+---------+-----------+---------+-----------+--------+  +-------------------------+---------+-----------+---------+-----------+--------+            EIV           RT-PatentRT-ThrombusLT-PatentLT-ThrombusComments +-------------------------+---------+-----------+---------+-----------+--------+ External Iliac Vein                  acute    patent                      Distal                                                                    +-------------------------+---------+-----------+---------+-----------+--------+   Summary: IVC/Iliac: Acute DVT noted in the right external iliac vein at the right groin. Remaining visualized iliac veins are patent. Unable to visualize the IVC due to overlying bowel gas.  *See table(s) above for measurements and observations.  Electronically signed by Jamelle Haring on 08/14/2020 at 4:36:10 PM.   Final    DG Chest Portable 1 View  Result Date: 08/14/2020 CLINICAL DATA:  Edema. EXAM: PORTABLE  CHEST 1 VIEW COMPARISON:  04/03/2020. FINDINGS: Cardiomegaly with pulmonary venous congestion. Mild bilateral interstitial prominence. Mild CHF cannot be excluded. Left-sided pleural thickening again noted. No pleural effusion or pneumothorax.  Costophrenic angles incompletely imaged. IMPRESSION: Cardiomegaly with pulmonary venous congestion. Mild bilateral interstitial prominence. Mild CHF cannot be excluded. Electronically Signed   By: Marcello Moores  Register   On: 08/14/2020 09:54   VAS [place] ABI WITH/WO TBI  Result Date: 08/02/2020 LOWER EXTREMITY DOPPLER STUDY Indications: Peripheral artery disease, and RT foot blisters, 09-28-2019 Left              BKA.  Limitations: Today's exam was limited due to an open wound and bandages. Comparison Study: No prior studies available. Performing Technologist: Darlin Coco RDMS RVT  Examination Guidelines: A complete evaluation includes at minimum, Doppler waveform signals and systolic blood pressure reading at the level of bilateral brachial, anterior tibial, and posterior tibial arteries, when vessel segments are accessible. Bilateral testing is considered an integral part of a complete examination. Photoelectric Plethysmograph (PPG) waveforms and toe systolic pressure readings are included as required and additional duplex testing as needed. Limited examinations for reoccurring indications may be performed as noted.  ABI Findings: +---------+------------------+-----+----------+--------------------------------+ Right    Rt Pressure (mmHg)IndexWaveform  Comment                          +---------+------------------+-----+----------+--------------------------------+ Brachial 141                    triphasic                                  +---------+------------------+-----+----------+--------------------------------+ PTA      80                0.57 monophasic                                  +---------+------------------+-----+----------+--------------------------------+ DP       56                0.40 monophasic                                 +---------+------------------+-----+----------+--------------------------------+ Great Toe                                 Unable to obtain due to wounds                                             and bandaging                    +---------+------------------+-----+----------+--------------------------------+ +--------+------------------+-----+---------+--------+ Left    Lt Pressure (mmHg)IndexWaveform Comment  +--------+------------------+-----+---------+--------+ [id number]                    triphasic         +--------+------------------+-----+---------+--------+ PTA                                     Left BKA +--------+------------------+-----+---------+--------+ DP  Left BKA +--------+------------------+-----+---------+--------+ +-------+-----------+-----------+------------+------------+ ABI/TBIToday's ABIToday's TBIPrevious ABIPrevious TBI +-------+-----------+-----------+------------+------------+ Right                                                 +-------+-----------+-----------+------------+------------+  Summary: Right: Resting right ankle-brachial index indicates moderate right lower extremity arterial disease. Unable to obtain toe pressure secondary to wounds and bandaging. Left: S/P Left BKA.  *See table(s) above for measurements and observations.  Electronically signed by Deitra Mayo MD on 08/02/2020 at 6:39:51 PM.   Final    VAS [place] LOWER EXTREMITY SAPHENOUS VEIN MAPPING  Result Date: 08/05/2020 LOWER EXTREMITY VEIN MAPPING Indications:        Pre-op History:            History of PAD; patient is pre-operative for lower extremity                     bypass graft. Other Risk Factors: Left BKA.  Comparison Study: No prior study Performing  Technologist: Sharion Dove RVS  Examination Guidelines: A complete evaluation includes B-mode imaging, spectral Doppler, color Doppler, and power Doppler as needed of all accessible portions of each vessel. Bilateral testing is considered an integral part of a complete examination. Limited examinations for reoccurring indications may be performed as noted. +---------------+-----------+----------------------+---------------+-----------+   RT Diameter  RT Findings         GSV            LT Diameter  LT Findings      (cm)                                            (cm)                  +---------------+-----------+----------------------+---------------+-----------+      0.64                     Saphenofemoral         0.49                                                   Junction                                  +---------------+-----------+----------------------+---------------+-----------+      0.42                     Proximal thigh         0.40                  +---------------+-----------+----------------------+---------------+-----------+      0.43                       Mid thigh            0.36       branching  +---------------+-----------+----------------------+---------------+-----------+      0.42       branching      Distal thigh  0.27                  +---------------+-----------+----------------------+---------------+-----------+      0.45                          Knee                            BKA     +---------------+-----------+----------------------+---------------+-----------+      0.42       branching       Prox calf                          BKA     +---------------+-----------+----------------------+---------------+-----------+      0.37                        Mid calf                          BKA     +---------------+-----------+----------------------+---------------+-----------+      0.35       branching      Distal calf                          BKA     +---------------+-----------+----------------------+---------------+-----------+      0.31       branching         Ankle                            BKA     +---------------+-----------+----------------------+---------------+-----------+ Diagnosing physician: Monica Martinez MD Electronically signed by Monica Martinez MD on 08/05/2020 at 4:36:00 PM.    Final    ECHOCARDIOGRAM COMPLETE  Result Date: 07/30/2020    ECHOCARDIOGRAM REPORT   Patient Name:   SHALIK SANFILIPPO Date of Exam: 07/30/2020 Medical Rec #:  [id number]         Height:       72.0 in Accession #:    [id number]        Weight:       218.0 lb Date of Birth:  08-14-1950        BSA:          2.210 m Patient Age:    52 years          BP:           126/60 mmHg Patient Gender: M                 HR:           61 bpm. Exam Location:  [place] Procedure: 2D Echo, 3D Echo, Cardiac Doppler and Color Doppler Indications:    I48.19 Atrial Fibrillation  History:        Patient has prior history of Echocardiogram examinations, most                 recent 09/28/2019. CHF, CAD, Aortic Valve Disease; Risk                 Factors:Family History of Coronary Artery Disease, Hypertension,                 Diabetes, Dyslipidemia and Current Smoker. Aortic Stenosis.  Sonographer:    [place] Referring Phys: [phone]  [place] O'NEAL IMPRESSIONS  1. Left ventricular ejection fraction, by estimation, is 50 to 55%. The left ventricle has low normal function. The left ventricle has no regional wall motion abnormalities. There is moderate left ventricular hypertrophy. Left ventricular diastolic parameters are consistent with Grade II diastolic dysfunction (pseudonormalization).  2. Right ventricular systolic function is normal. The right ventricular size is moderately enlarged. There is normal pulmonary artery systolic pressure. The estimated right ventricular systolic pressure is 69.6 mmHg.  3. Left atrial size was  severely dilated.  4. Right atrial size was severely dilated.  5. The mitral valve is normal in structure. Mild mitral valve regurgitation. No evidence of mitral stenosis. The mean mitral valve gradient is 2.0 mmHg. Moderate mitral annular calcification.  6. The aortic valve is calcified. There is moderate calcification of the aortic valve. There is moderate thickening of the aortic valve. Aortic valve regurgitation is not visualized. Moderate aortic valve stenosis. Aortic valve mean gradient measures 33.0 mmHg. Aortic valve Vmax measures 3.80 m/s.  7. Aortic dilatation noted. There is mild dilatation of the ascending aorta, measuring 40 mm.  8. The inferior vena cava is normal in size with greater than 50% respiratory variability, suggesting right atrial pressure of 3 mmHg. Comparison(s): Prior images reviewed side by side. The left ventricular function has improved. No longer in atrial fibrillation. EF has improved. FINDINGS  Left Ventricle: Left ventricular ejection fraction, by estimation, is 50 to 55%. The left ventricle has low normal function. The left ventricle has no regional wall motion abnormalities. The left ventricular internal cavity size was normal in size. There is moderate left ventricular hypertrophy. Left ventricular diastolic parameters are consistent with Grade II diastolic dysfunction (pseudonormalization). Right Ventricle: The right ventricular size is moderately enlarged. No increase in right ventricular wall thickness. Right ventricular systolic function is normal. There is normal pulmonary artery systolic pressure. The tricuspid regurgitant velocity is 2.74 m/s, and with an assumed right atrial pressure of 3 mmHg, the estimated right ventricular systolic pressure is 78.9 mmHg. Left Atrium: Left atrial size was severely dilated. Right Atrium: Right atrial size was severely dilated. Pericardium: There is no evidence of pericardial effusion. Mitral Valve: The mitral valve is normal in  structure. There is moderate thickening of the mitral valve leaflet(s). There is moderate calcification of the mitral valve leaflet(s). Moderate mitral annular calcification. Mild mitral valve regurgitation. No  evidence of mitral valve stenosis. MV peak gradient, 7.8 mmHg. The mean mitral valve gradient is 2.0 mmHg. Tricuspid Valve: The tricuspid valve is normal in structure. Tricuspid valve regurgitation is trivial. No evidence of tricuspid stenosis. Aortic Valve: The aortic valve is calcified. There is moderate calcification of the aortic valve. There is moderate thickening of the aortic valve. Aortic valve regurgitation is not visualized. Moderate aortic stenosis is present. Aortic valve mean gradient measures 33.0 mmHg. Aortic valve peak gradient measures 57.8 mmHg. Aortic valve area, by VTI measures 0.79 cm. Pulmonic Valve: The pulmonic valve was normal in structure. Pulmonic valve regurgitation is not visualized. No evidence of pulmonic stenosis. Aorta: Aortic dilatation noted. There is mild dilatation of the ascending aorta, measuring 40 mm. Venous: The inferior vena cava is normal in size with greater than 50% respiratory variability, suggesting right atrial pressure of 3 mmHg. IAS/Shunts: No atrial level shunt detected by color flow Doppler.  LEFT VENTRICLE PLAX 2D LVIDd:         5.40 cm  Diastology LVIDs:         3.50 cm  LV  e' medial:    9.03 cm/s LV PW:         1.50 cm  LV E/e' medial:  14.1 LV IVS:        1.10 cm  LV e' lateral:   8.49 cm/s LVOT diam:     2.10 cm  LV E/e' lateral: 15.0 LV SV:         67 LV SV Index:   30 LVOT Area:     3.46 cm                          3D Volume EF:                         3D EF:        56 %                         LV EDV:       209 ml                         LV ESV:       92 ml                         LV SV:        117 ml RIGHT VENTRICLE RV S prime:     14.50 cm/s TAPSE (M-mode): 2.2 cm LEFT ATRIUM              Index       RIGHT ATRIUM           Index LA diam:         4.50 cm  2.04 cm/m  RA Area:     29.00 cm LA Vol (A2C):   166.0 ml 75.11 ml/m RA Volume:   115.00 ml 52.04 ml/m LA Vol (A4C):   120.0 ml 54.30 ml/m LA Biplane Vol: 145.0 ml 65.61 ml/m  AORTIC VALVE AV Area (Vmax):    0.69 cm AV Area (Vmean):   0.67 cm AV Area (VTI):     0.79 cm AV Vmax:           380.00 cm/s AV Vmean:          243.000 cm/s AV VTI:            0.852 m AV Peak Grad:      57.8 mmHg AV Mean Grad:      33.0 mmHg LVOT Vmax:         75.75 cm/s LVOT Vmean:        47.250 cm/s LVOT VTI:          0.194 m LVOT/AV VTI ratio: 0.23  AORTA Ao Root diam: 3.70 cm Ao Asc diam:  3.90 cm MITRAL VALVE                TRICUSPID VALVE MV Area (PHT)  cm          TR Peak grad:   30.0 mmHg MV Area VTI:   1.60 cm     TR Vmax:        274.00 cm/s MV Peak grad:  7.8 mmHg MV Mean grad:  2.0 mmHg     SHUNTS MV Vmax:       1.40 m/s     Systemic VTI:  0.19 m MV Vmean:  53.3 cm/s    Systemic Diam: 2.10 cm MV Decel Time: 255 msec MR Peak grad: 109.4 mmHg MR Mean grad: 74.0 mmHg MR Vmax:      523.00 cm/s MR Vmean:     405.0 cm/s MV E velocity: 127.00 cm/s MV A velocity: 51.80 cm/s MV E/A ratio:  2.45 Candee Furbish MD Electronically signed by Candee Furbish MD Signature Date/Time: 07/30/2020/5:46:47 PM    Final    VAS US CAROTID  Result Date: 08/04/2020 Carotid Arterial Duplex Study Indications:       Bilateral bruits. Risk Factors:      Hypertension, Diabetes, current smoker, coronary artery                    disease, PAD. Other Factors:     CHF, Afib, CKD, AO stenosis, Obesity. Comparison Study:  No previous exams Performing Technologist: Rogelia Rohrer  Examination Guidelines: A complete evaluation includes B-mode imaging, spectral Doppler, color Doppler, and power Doppler as needed of all accessible portions of each vessel. Bilateral testing is considered an integral part of a complete examination. Limited examinations for reoccurring indications may be performed as noted.  Right Carotid Findings:  +----------+--------+--------+--------+------------------+------------------+           PSV cm/sEDV cm/sStenosisPlaque DescriptionComments           +----------+--------+--------+--------+------------------+------------------+ CCA Prox  47      11                                intimal thickening +----------+--------+--------+--------+------------------+------------------+ CCA Distal46      13                                intimal thickening +----------+--------+--------+--------+------------------+------------------+ ICA Prox  60      16                                                   +----------+--------+--------+--------+------------------+------------------+ ICA Distal71      24                                                   +----------+--------+--------+--------+------------------+------------------+ ECA       118     9                                                    +----------+--------+--------+--------+------------------+------------------+ +----------+--------+-------+----------------+-------------------+           PSV cm/sEDV cmsDescribe        Arm Pressure (mmHG) +----------+--------+-------+----------------+-------------------+ [id number]             Multiphasic, WNL                    +----------+--------+-------+----------------+-------------------+ +---------+--------+--+--------+-+---------+ VertebralPSV cm/s34EDV cm/s8Antegrade +---------+--------+--+--------+-+---------+  Left Carotid Findings: +----------+-------+--------+--------+-----------------------+-----------------+           PSV    EDV cm/sStenosisPlaque Description     Comments  cm/s                                                            +----------+-------+--------+--------+-----------------------+-----------------+ CCA Prox  64     8                                      intimal                                                                    thickening        +----------+-------+--------+--------+-----------------------+-----------------+ CCA Distal46     8               calcific and           intimal                                            heterogenous           thickening        +----------+-------+--------+--------+-----------------------+-----------------+ ICA Prox  48     14              focal and calcific                       +----------+-------+--------+--------+-----------------------+-----------------+ ICA Distal64     20                                                       +----------+-------+--------+--------+-----------------------+-----------------+ ECA       86     9               calcific and focal                       +----------+-------+--------+--------+-----------------------+-----------------+ +----------+--------+--------+----------------+-------------------+           PSV cm/sEDV cm/sDescribe        Arm Pressure (mmHG) +----------+--------+--------+----------------+-------------------+ [id number]              Multiphasic, WNL                    +----------+--------+--------+----------------+-------------------+ +---------+--------+--------+---------------------+ VertebralPSV cm/sEDV cm/sNot seen on this exam +---------+--------+--------+---------------------+   Summary: Right Carotid: The extracranial vessels were near-normal with only minimal wall                thickening or plaque. Left Carotid: The extracranial vessels were near-normal with only minimal wall               thickening or plaque. Vertebrals:  Right vertebral artery demonstrates antegrade flow. Left vertebral              artery was not  visualized. Subclavians: Normal flow hemodynamics were seen in bilateral subclavian              arteries. *See table(s) above for measurements and observations.  Electronically signed by Deitra Mayo MD on 08/04/2020 at 4:05:23 PM.    Final     VAS [place] LOWER EXTREMITY VENOUS (DVT)  Result Date: 08/14/2020  Lower Venous DVT Study Indications: Edema, Erythema, and recent right lower extremity bypass, recent right lower extremity cellulitis.  Limitations: Body habitus, poor ultrasound/tissue interface, open wound and pain. Comparison Study: 09/27/2019 negative left lower extremity venous duplex Performing Technologist: Maudry Mayhew MHA, RDMS, RVT, RDCS  Examination Guidelines: A complete evaluation includes B-mode imaging, spectral Doppler, color Doppler, and power Doppler as needed of all accessible portions of each vessel. Bilateral testing is considered an integral part of a complete examination. Limited examinations for reoccurring indications may be performed as noted. The reflux portion of the exam is performed with the patient in reverse Trendelenburg.  +---------+---------------+---------+-----------+----------+--------------+ RIGHT    CompressibilityPhasicitySpontaneityPropertiesThrombus Aging +---------+---------------+---------+-----------+----------+--------------+ CFV      None                    No                   Acute          +---------+---------------+---------+-----------+----------+--------------+ SFJ      None                                         Acute          +---------+---------------+---------+-----------+----------+--------------+ FV Prox  Full                                                        +---------+---------------+---------+-----------+----------+--------------+ FV Mid   Full                                                        +---------+---------------+---------+-----------+----------+--------------+ FV DistalFull                                                        +---------+---------------+---------+-----------+----------+--------------+ POP      Full           Yes      Yes                                  +---------+---------------+---------+-----------+----------+--------------+ PTV      Full                                                        +---------+---------------+---------+-----------+----------+--------------+   Right Technical Findings: Not visualized segments include  PFV, peroneal veins.  +----+---------------+---------+-----------+----------+--------------+ LEFTCompressibilityPhasicitySpontaneityPropertiesThrombus Aging +----+---------------+---------+-----------+----------+--------------+ CFV Full           Yes      Yes                                 +----+---------------+---------+-----------+----------+--------------+     Summary: RIGHT: - Findings consistent with acute deep vein thrombosis involving the right common femoral vein, and SF junction. - No cystic structure found in the popliteal fossa.  LEFT: - No evidence of common femoral vein obstruction.  *See table(s) above for measurements and observations. Electronically signed by Jamelle Haring on 08/14/2020 at 4:36:30 PM.    Final      Results/Tests Pending at Time of Discharge: none  Discharge Medications:  Allergies as of 08/19/2020   No Known Allergies     Medication List    TAKE these medications   Acetaminophen Extra Strength 500 MG tablet Generic drug: acetaminophen Take 2 tablets (1,000 mg total) by mouth every 6 (six) hours.   amiodarone 200 MG tablet Commonly known as: PACERONE Take 1 tablet (200 mg total) by mouth daily.   atorvastatin 80 MG tablet Commonly known as: LIPITOR TAKE 1 TABLET EVERY DAY   carvedilol 12.5 MG tablet Commonly known as: COREG Take 1 tablet (12.5 mg total) by mouth 2 (two) times daily with a meal. What changed: when to take this   cephALEXin 500 MG capsule Commonly known as: KEFLEX Take 1 capsule (500 mg total) by mouth 4 (four) times daily for 4 days.   clopidogrel 75 MG tablet Commonly known as: PLAVIX Take 1 tablet (75 mg total) by mouth daily.    furosemide 40 MG tablet Commonly known as: LASIX Take 1 tablet (40 mg total) by mouth daily. Start taking on: August 20, 2020   losartan 25 MG tablet Commonly known as: COZAAR Take 1/2 tablet (12.5 mg total) by mouth daily. Start taking on: August 20, 2020   metFORMIN 1000 MG tablet Commonly known as: GLUCOPHAGE Take 1 tablet (1,000 mg total) by mouth daily.   multivitamin Tabs tablet Take 1 tablet by mouth at bedtime.   nicotine 7 mg/24hr patch Commonly known as: NICODERM CQ - dosed in mg/24 hr Place 7 mg onto the skin daily.   NovoLIN 70/30 ReliOn (70-30) 100 UNIT/ML injection Generic drug: insulin NPH-regular Human Inject 15-30 Units into the skin See admin instructions. Inject 30 units into the skin with breakfast and 15 units with supper- "until eventually replaced by Lantus"   Oxycodone HCl 10 MG Tabs Take 1 tablet (10 mg total) by mouth every 6 (six) hours as needed for up to 7 days.   ReliOn True Metrix Test Strips test strip Generic drug: glucose blood Use to test blood sugar three times per day.   spironolactone 25 MG tablet Commonly known as: ALDACTONE Take 1 tablet (25 mg total) by mouth daily. Start taking on: August 20, 2020   True Metrix Meter [place] Use to test blood sugar three times daily.   True Metrix Meter w/Device Kit USE AS DIRECTED   TRUEplus Lancets 33G Misc Use to test blood sugar three times per day.   warfarin 5 MG tablet Commonly known as: COUMADIN Take as directed. If you are unsure how to take this medication, talk to your nurse or doctor. Original instructions: Take 1 tablet (5 mg total) by mouth daily. What changed:   how much to take  when  to take this  additional instructions            Discharge Care Instructions  (From admission, onward)         Start     Ordered   08/19/20 0000  Discharge wound care:       Comments: Continue current wound care: Please apply Silvadene to blistered areas of right lateral lower leg  and medial ankle/foot. Place Kerlix and leave off Ace wrap today, but elevate on 2 pillows. Replace ACE wrap tomorrow with GRADED compression from forefoot to thigh   08/19/20 1431          Discharge Instructions: Please refer to Patient Instructions section of EMR for full details.  Patient was counseled important signs and symptoms that should prompt return to medical care, changes in medications, dietary instructions, activity restrictions, and follow up appointments.   Follow-Up Appointments:  Follow-up Information    [facility] up.   Specialty: [place] Why: ( [facility] ) Contact information: [address] [place] [address] [phone]               Zola Button, MD 08/19/2020, 7:03 PM PGY-1, [place]

## 2020-08-19 NOTE — Progress Notes (Addendum)
ANTICOAGULATION CONSULT NOTE - Follow-up Consult  Pharmacy Consult for warfarin and enoxaparin Indication: history of atrial fibrillation with new DVT  No Known Allergies  Patient Measurements: Height: 6' (182.9 cm) Weight: 110.8 kg (244 lb 4.3 oz) IBW/kg (Calculated) : 77.6  Vital Signs: Temp: 98.3 F (36.8 C) (04/25 0543) Temp Source: Oral (04/25 0543) BP: 125/61 (04/25 0543) Pulse Rate: 59 (04/25 0543)  Labs: Recent Labs    08/16/20 1509 08/17/20 1054 08/18/20 0328 08/18/20 1850 08/19/20 0849  HGB 8.0* 8.1* 7.8* 9.2*  --   HCT 24.2* 25.6* 24.9* 28.7*  --   PLT 316 344 388  --   --   LABPROT  --  24.1* 28.3*  --  29.7*  INR  --  2.2* 2.7*  --  2.8*  CREATININE  --   --  1.53*  --  1.68*    Estimated Creatinine Clearance: 54.1 mL/min (A) (by C-G formula based on SCr of 1.68 mg/dL (H)).   Medical History: Past Medical History:  Diagnosis Date  . CHF (congestive heart failure) ([place])   . Coronary artery disease   . Diabetes mellitus without complication ([place])   . HLD (hyperlipidemia)   . Hypertension   . Peripheral edema 02/24/2018  . SOB (shortness of breath) 07/26/2018    Assessment: 70 y/o M on warfarin for Afib recently discharged on 4/19 and re-admitted 4/20 with new DVT in the setting of warfarin hold 4/10-4/14 for R common femoral to below-knee popliteal bypass on 4/13. Warfarin resumed 4/14 with enoxaparin bridge. Enoxaparin d/c'd and warfarin held 4/16 d/t R groin hematoma and Hgb drop. Warfarin re-initiated 4/17.   INR today is therapeutic at 2.8, INR has trended up quickly from 2.2 to 2.7 since warfarin re-initiation. Patient has required lower than PTA warfarin dose, will plan to discharge on lower doses. Hgb stable at 8.9 . No current active bleed issues reported. Patient will be discharged today.  Goal of Therapy:  INR 2-3 Monitor platelets by anticoagulation protocol: Yes   Plan:  Start warfarin 5 mg daily on discharge Discontinue enoxaparin  100 mg, bridge complete today Plan to monitor INR outpatient    Benna Dunks  PharmD Candidate, Class of 2022

## 2020-08-19 NOTE — TOC Initial Note (Signed)
Transition of Care [facility]) - Initial/Assessment Note    Patient Details  Name: Rodney [name] MRN: [id number] Date of Birth: [birth year]  Transition of Care [facility]) CM/SW Contact:    Marilu Favre, RN Phone Number: 08/19/2020, 11:30 AM  Clinical Narrative:                 Patient from home. Girl friend helps with wound care. Was active with [facility] ( [facility]) for PT . Patient requesting [facility] for HHPT. Referral and request given to [facility] with [facility].  Expected Discharge Plan: [place] Barriers to Discharge: Continued Medical Work up   Patient Goals and CMS Choice Patient states their goals for this hospitalization and ongoing recovery are:: to return to home CMS Medicare.gov Compare Post Acute Care list provided to:: Patient Choice offered to / list presented to : Patient  Expected Discharge Plan and Services Expected Discharge Plan: [place]   Discharge Planning Services: CM Consult Post Acute Care Choice: [place] arrangements for the past 2 months: Single Family Home                 DME Arranged: N/A DME Agency: NA       HH Arranged: PT,RN [place]: [facility] [employer] Date [place]: 08/19/20 Time [place]: 1129 Representative spoke with at [place]: [place] Arrangements/Services Living arrangements for the past 2 months: [place] with:: Significant Other Patient language and need for interpreter reviewed:: Yes Do you feel safe going back to the place where you live?: Yes      Need for Family Participation in Patient Care: Yes (Comment) Care giver support system in place?: Yes (comment) Current home services: DME Criminal Activity/Legal Involvement Pertinent to Current Situation/Hospitalization: No - Comment as needed  Activities of Daily Living Home Assistive Devices/Equipment: Prosthesis,Walker (specify  type),Wheelchair,Cane (specify quad or straight) ADL Screening (condition at time of admission) Patient's cognitive ability adequate to safely complete daily activities?: Yes Is the patient deaf or have difficulty hearing?: Yes Does the patient have difficulty seeing, even when wearing glasses/contacts?: No Does the patient have difficulty concentrating, remembering, or making decisions?: No Patient able to express need for assistance with ADLs?: Yes Does the patient have difficulty dressing or bathing?: Yes Independently performs ADLs?: No Dressing (OT): Needs assistance Bathing: Needs assistance Does the patient have difficulty walking or climbing stairs?: Yes Weakness of Legs: Right Weakness of Arms/Hands: None  Permission Sought/Granted   Permission granted to share information with : No              Emotional Assessment Appearance:: Appears stated age Attitude/Demeanor/Rapport: Engaged Affect (typically observed): Accepting Orientation: : Oriented to Situation,Oriented to  Time,Oriented to Place,Oriented to Self Alcohol / Substance Use: Not Applicable Psych Involvement: No (comment)  Admission diagnosis:  Peripheral edema [R60.9] Cellulitis of right lower extremity [N86.767] Infection of superficial incisional surgical site after procedure, initial encounter [T81.41XA] Cellulitis and abscess of right lower extremity [L03.115, L02.415] Patient Active Problem List   Diagnosis Date Noted  . Cellulitis and abscess of right lower extremity 08/18/2020  . Cellulitis of right lower extremity 08/14/2020  . Acute deep vein thrombosis (DVT) of iliac vein of right lower extremity (HCC)   . Acute on chronic anemia   . Ascending aorta dilation ([place]) 08/04/2020  . Gangrene of toe of right foot ([place]) 08/04/2020  . Infective  myositis of right foot   . Cellulitis of foot, right 08/01/2020  . 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 CAD (coronary artery disease) 02/15/2020  . CRI (chronic renal  insufficiency), stage 3 (moderate) ([place]) 02/15/2020  . Long term (current) use of anticoagulants 12/29/2019  . S/P BKA (below knee amputation) unilateral, left ([place]) 11/14/2019  . High risk social situation 11/14/2019  . Atrial fibrillation ([place]) 10/03/2019  . HFrEF (heart failure with reduced ejection fraction) ([place]) 09/28/2019  . Moderate aortic stenosis 09/28/2019  . Anemia in chronic illness 09/27/2019  . PVD (peripheral vascular disease) ([place])   . Umbilical hernia 26/20/3559  . Obesity 10/09/2015  . Tobacco abuse 09/07/2013  . DM (diabetes mellitus), type 2 with neurological complications ([place]) 74/16/3845  . Hypercholesteremia 10/28/2010  . ERECTILE DYSFUNCTION 05/22/2009  . Essential hypertension 01/23/2009   PCP:  [name], MD Pharmacy:   [place], [place] - [address] [place] [address] Phone: [phone] Fax: [facility] [place], [place] [address] Phone: [phone] Fax: [phone]  [place]), [place] - [address] [place] ([place]) [place] [address] Phone: [phone] Fax: [phone]     Social Determinants of Health (SDOH) Interventions    Readmission Risk Interventions Readmission Risk Prevention Plan 08/13/2020  Transportation Screening Complete  PCP or Specialist Appt within 3-5 Days Complete  HRI or Home Care Consult Complete  Social Work Consult for [place] Planning/Counseling Complete  Palliative Care Screening Not Applicable  Medication Review Press photographer) Complete  Some recent data might be hidden

## 2020-08-19 NOTE — Discharge Instructions (Signed)
Dear [name],   Thank you so much for allowing [place] to be part of your care!  You were admitted to [facility] for a blood clot and skin infection in your leg.  You were treated with blood thinners and antibiotics.   POST-HOSPITAL & CARE INSTRUCTIONS 1. Make sure you take your antibiotics, Keflex, 4 times a day for 4 more days until Friday, April 29. 2. You can take oxycodone as needed for pain.  We expect that your pain should start to improve and you should be able to space out your doses. 3. Make sure you take the carvedilol twice a day and take a 1/2 tablet of losartan (12.5 mg total) once a day. 4. We have made you a hospital follow-up appointment at the [facility] on Thursday, April 28 at 11:10 AM.  If you need to reschedule this, please call our clinic. 5. Please make sure to follow-up with the vascular surgeon, Dr. Carlis Abbott on May 10 at 1:40 PM. 6. Please let PCP/Specialists know of any changes that were made.  7. Please see medications section of this packet for any medication changes.   DOCTOR'S APPOINTMENT & FOLLOW UP CARE INSTRUCTIONS  Future Appointments  Date Time Provider [place]  08/22/2020 11:10 AM ACCESS TO CARE POOL [facility] [place]  09/03/2020  1:40 PM Marty Heck, MD [facility]  11/05/2020  1:30 PM O'Neal, Cassie Freer, MD [facility]    RETURN PRECAUTIONS: Please return to the ED or call your PCP if you develop fever, shortness of breath, or worsening redness or drainage from your incision sites.  Take care and be well!  [facility]  [place], [place] [address] [phone]

## 2020-08-20 ENCOUNTER — Other Ambulatory Visit (HOSPITAL_COMMUNITY): Payer: Self-pay

## 2020-08-20 DIAGNOSIS — I70222 Atherosclerosis of native arteries of extremities with rest pain, left leg: Secondary | ICD-10-CM | POA: Diagnosis not present

## 2020-08-20 DIAGNOSIS — D631 Anemia in chronic kidney disease: Secondary | ICD-10-CM | POA: Diagnosis not present

## 2020-08-20 DIAGNOSIS — E1122 Type 2 diabetes mellitus with diabetic chronic kidney disease: Secondary | ICD-10-CM | POA: Diagnosis not present

## 2020-08-20 DIAGNOSIS — E1140 Type 2 diabetes mellitus with diabetic neuropathy, unspecified: Secondary | ICD-10-CM | POA: Diagnosis not present

## 2020-08-20 DIAGNOSIS — I130 Hypertensive heart and chronic kidney disease with heart failure and stage 1 through stage 4 chronic kidney disease, or unspecified chronic kidney disease: Secondary | ICD-10-CM | POA: Diagnosis not present

## 2020-08-20 DIAGNOSIS — I5042 Chronic combined systolic (congestive) and diastolic (congestive) heart failure: Secondary | ICD-10-CM | POA: Diagnosis not present

## 2020-08-20 DIAGNOSIS — L03115 Cellulitis of right lower limb: Secondary | ICD-10-CM | POA: Diagnosis not present

## 2020-08-20 DIAGNOSIS — N1831 Chronic kidney disease, stage 3a: Secondary | ICD-10-CM | POA: Diagnosis not present

## 2020-08-20 DIAGNOSIS — E1152 Type 2 diabetes mellitus with diabetic peripheral angiopathy with gangrene: Secondary | ICD-10-CM | POA: Diagnosis not present

## 2020-08-21 ENCOUNTER — Other Ambulatory Visit (HOSPITAL_COMMUNITY): Payer: Self-pay

## 2020-08-21 ENCOUNTER — Telehealth (HOSPITAL_COMMUNITY): Payer: Self-pay | Admitting: Pharmacist

## 2020-08-21 NOTE — Telephone Encounter (Signed)
Transitions of Care Pharmacy   Call attempted for a pharmacy transitions of care follow-up. HIPAA appropriate voicemail was left with call back information provided.   Call attempt #1. Will follow-up in 2-3 days.   Mickey Hebel Ann Efe Fazzino, PharmD    

## 2020-08-22 ENCOUNTER — Other Ambulatory Visit (HOSPITAL_COMMUNITY): Payer: Self-pay

## 2020-08-22 ENCOUNTER — Inpatient Hospital Stay: Payer: Medicare PPO

## 2020-08-22 ENCOUNTER — Telehealth: Payer: Self-pay

## 2020-08-22 NOTE — Telephone Encounter (Signed)
[facility] PT LVM on nurse line requesting verbals orders for [facility] PT as follows.  1x a week for 1 week  2x a week for 4 weeks  1x a week for 2 weeks   Verbal orders given per [facility] protocol.

## 2020-08-23 ENCOUNTER — Telehealth: Payer: Self-pay

## 2020-08-23 DIAGNOSIS — E1152 Type 2 diabetes mellitus with diabetic peripheral angiopathy with gangrene: Secondary | ICD-10-CM | POA: Diagnosis not present

## 2020-08-23 DIAGNOSIS — I5042 Chronic combined systolic (congestive) and diastolic (congestive) heart failure: Secondary | ICD-10-CM | POA: Diagnosis not present

## 2020-08-23 DIAGNOSIS — I70222 Atherosclerosis of native arteries of extremities with rest pain, left leg: Secondary | ICD-10-CM | POA: Diagnosis not present

## 2020-08-23 DIAGNOSIS — L03115 Cellulitis of right lower limb: Secondary | ICD-10-CM | POA: Diagnosis not present

## 2020-08-23 DIAGNOSIS — D631 Anemia in chronic kidney disease: Secondary | ICD-10-CM | POA: Diagnosis not present

## 2020-08-23 DIAGNOSIS — E1122 Type 2 diabetes mellitus with diabetic chronic kidney disease: Secondary | ICD-10-CM | POA: Diagnosis not present

## 2020-08-23 DIAGNOSIS — E1140 Type 2 diabetes mellitus with diabetic neuropathy, unspecified: Secondary | ICD-10-CM | POA: Diagnosis not present

## 2020-08-23 DIAGNOSIS — I130 Hypertensive heart and chronic kidney disease with heart failure and stage 1 through stage 4 chronic kidney disease, or unspecified chronic kidney disease: Secondary | ICD-10-CM | POA: Diagnosis not present

## 2020-08-23 DIAGNOSIS — N1831 Chronic kidney disease, stage 3a: Secondary | ICD-10-CM | POA: Diagnosis not present

## 2020-08-23 NOTE — Telephone Encounter (Signed)
Pam HH RN LVM on nurse line requesting verbal orders for [facility] nursing as follows.  1x a week for 1 week  2x a week for 1 week  1x a week for 7 weeks  2 PRN  Verbal orders given per [facility] protocol.

## 2020-08-25 NOTE — Progress Notes (Signed)
Subjective:   Patient ID: Rodney [name]    DOB: 06/15/1950, 70 y.o. male   MRN: [id number]  Rodney [name] is a 70 y.o. 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 Initially hospitalized for RLE swelling and underwent stenting of right common iliac on 4/11 and RLE superficial femoral to below-knee popliteal artery bypass on 4/13. For both admission he was treated with IV antibiotics and then transitioned to keflex. Blood cultures negative to date.  Vascular surgery was consulted and felt no further surgical intervention was indicated He is currently undergoing PT outpatient and tolerating well.  Has follow up appointment with vascular surgery on 09/03/20.   Acute on Chronic Anemia of Chronic Disease and Acute blood loss: Patient was noted to have drop in hemoglobin after initial surgeries requiring 3 units of blood..  He had no signs of acute bleed and negative FOBT.  CT of the abdomen without contrast initially revealed a 4 x 3.3 cm hematoma in the right groin anterior to the vessels but without notable bleeding or drainage.  During subsequent hospitalization hemoglobin remained stable except for small decline likely thought to be iatrogenic in nature.  He received 1 unit of drains fusion with hemoglobin at 8.9 on discharge.  Patient denies any blood loss since discharge.  Right lower extremity DVT: Noted on DVT ultrasound.  Patient is on chronic anticoagulation with warfarin however with held with surgery.  INR for supratherapeutic range on  admission-warfarin held started on 5-day course of therapeutic Lovenox bridge with warfarin until INR was at goal between 2-3 Currently on Warfarin 5mg  daily.   AKI on CKD 3a: Initially nephrogenic medications were held.  He was restarted on his losartan, half of home dose, at 12.5 mg QD.  Home Lasix was restarted as well as spironolactone.  Discharge creatinine was 1.68.   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   Review of Systems:  Per HPI.   Objective:   BP (!) 111/57   Pulse 63   Wt 227 lb 9.6 oz (103.2 kg)   SpO2 96%   BMI 30.87 kg/m  Vitals and nursing note reviewed.  General: pleasant older male, sitting comfortably wheel chair with wife at his side, sitting comfortably in exam chair, well nourished, well developed, in no acute distress with non-toxic appearance Resp: breathing comfortably on room air, speaking in full sentences Skin/Extremities: RLE with scabbing, mild superficial desquamation and mild erythema along dorsum of foot, ankle to mid shin, and linear scab from surgery up medial LE to thigh, no bleeding or discharge, no warmth, wounds appear dry overall, one large tense bullous with clear fluid underlying on medial dorsal foot, 2+ pitting edema localized to dorsum of foot  Neuro: Alert and oriented, speech normal  Assessment & Plan:   Acute deep vein thrombosis (DVT) of iliac vein of right lower extremity (HCC) Currently on Warfarin 5mg  QD INR obtained to monitor. INR at goal. Monitored regularly with cardiology.  Acute on chronic anemia POC Hgb 9.5 today.  CBC to confirm and notable for Hgb of 10.7.  Denies any acute bleed. Due for colonoscopy. Recommended follow up with PCP.  Cellulitis of right lower extremity Improved. Completed Keflex. Wound appears to be healing well. Vaseline applied and rebandaged.  Follow up with [facility] on 09/03/20 Continue PT Short course of Oxy 5-10mg  BID PRN with scheduled Tylenol to get him through until his appointment  at [facility]. Do not plan to continue to refill this.   AKI (acute kidney injury) ([place]) AKI on CKD 3a.  BMP today to monitor. Cr stable at 1.66. Appears baseline is 1.2-1.5. K slightly elevated at 5.4.  Will have patient decrease to Spironolactone 12.5mg  QD and follow up for repeat BMP on 5/10 with at [facility] or at [facility] Continue remaining meds.   Orders Placed This Encounter  Procedures  . CBC  . Basic Metabolic Panel  . Basic Metabolic Panel    Standing Status:   Future    Standing Expiration Date:   08/30/2021  . POCT hemoglobin    Associate with Z13.0  . INR   Meds ordered this encounter  Medications  . acetaminophen (TYLENOL) 325 MG tablet    Sig: Take 2 tablets (650 mg total) by mouth every 6 (six) hours for 7 days. For pain.    Dispense:  30 tablet    Refill:  0  . oxycodone (OXY-IR) 5 MG capsule    Sig: Take 1-2 capsules (5-10 mg total) by mouth 2 (two) times daily as needed for up to 7 days.    Dispense:  28 capsule    Refill:  0     Mina Marble, DO PGY-3, [facility] 08/30/2020 2:06 PM

## 2020-08-27 DIAGNOSIS — I70222 Atherosclerosis of native arteries of extremities with rest pain, left leg: Secondary | ICD-10-CM | POA: Diagnosis not present

## 2020-08-27 DIAGNOSIS — E1122 Type 2 diabetes mellitus with diabetic chronic kidney disease: Secondary | ICD-10-CM | POA: Diagnosis not present

## 2020-08-27 DIAGNOSIS — E1152 Type 2 diabetes mellitus with diabetic peripheral angiopathy with gangrene: Secondary | ICD-10-CM | POA: Diagnosis not present

## 2020-08-27 DIAGNOSIS — L03115 Cellulitis of right lower limb: Secondary | ICD-10-CM | POA: Diagnosis not present

## 2020-08-27 DIAGNOSIS — D631 Anemia in chronic kidney disease: Secondary | ICD-10-CM | POA: Diagnosis not present

## 2020-08-27 DIAGNOSIS — I130 Hypertensive heart and chronic kidney disease with heart failure and stage 1 through stage 4 chronic kidney disease, or unspecified chronic kidney disease: Secondary | ICD-10-CM | POA: Diagnosis not present

## 2020-08-27 DIAGNOSIS — I5042 Chronic combined systolic (congestive) and diastolic (congestive) heart failure: Secondary | ICD-10-CM | POA: Diagnosis not present

## 2020-08-27 DIAGNOSIS — N1831 Chronic kidney disease, stage 3a: Secondary | ICD-10-CM | POA: Diagnosis not present

## 2020-08-27 DIAGNOSIS — E1140 Type 2 diabetes mellitus with diabetic neuropathy, unspecified: Secondary | ICD-10-CM | POA: Diagnosis not present

## 2020-08-29 ENCOUNTER — Other Ambulatory Visit: Payer: Self-pay

## 2020-08-29 ENCOUNTER — Ambulatory Visit (INDEPENDENT_AMBULATORY_CARE_PROVIDER_SITE_OTHER): Payer: Medicare PPO | Admitting: Family Medicine

## 2020-08-29 VITALS — BP 111/57 | HR 63 | Wt 227.6 lb

## 2020-08-29 DIAGNOSIS — E875 Hyperkalemia: Secondary | ICD-10-CM

## 2020-08-29 DIAGNOSIS — N179 Acute kidney failure, unspecified: Secondary | ICD-10-CM

## 2020-08-29 DIAGNOSIS — L03115 Cellulitis of right lower limb: Secondary | ICD-10-CM

## 2020-08-29 DIAGNOSIS — Z7901 Long term (current) use of anticoagulants: Secondary | ICD-10-CM

## 2020-08-29 DIAGNOSIS — D649 Anemia, unspecified: Secondary | ICD-10-CM | POA: Diagnosis not present

## 2020-08-29 DIAGNOSIS — D638 Anemia in other chronic diseases classified elsewhere: Secondary | ICD-10-CM

## 2020-08-29 DIAGNOSIS — I82421 Acute embolism and thrombosis of right iliac vein: Secondary | ICD-10-CM | POA: Diagnosis not present

## 2020-08-29 LAB — POCT INR: INR: 2 (ref 2.0–3.0)

## 2020-08-29 LAB — POCT HEMOGLOBIN: Hemoglobin: 9.5 g/dL — AB (ref 11–14.6)

## 2020-08-29 MED ORDER — ACETAMINOPHEN 325 MG PO TABS: 650.0000 mg | ORAL_TABLET | Freq: Four times a day (QID) | ORAL | 0 refills | Status: AC

## 2020-08-29 MED ORDER — OXYCODONE HCL 5 MG PO CAPS: 5.0000 mg | ORAL_CAPSULE | Freq: Two times a day (BID) | ORAL | 0 refills | Status: AC | PRN

## 2020-08-30 LAB — CBC
Hematocrit: 33 % — ABNORMAL LOW (ref 37.5–51.0)
Hemoglobin: 10.7 g/dL — ABNORMAL LOW (ref 13.0–17.7)
MCH: 28.9 pg (ref 26.6–33.0)
MCHC: 32.4 g/dL (ref 31.5–35.7)
MCV: 89 fL (ref 79–97)
Platelets: 371 10*3/uL (ref 150–450)
RBC: 3.7 x10E6/uL — ABNORMAL LOW (ref 4.14–5.80)
RDW: 13.9 % (ref 11.6–15.4)
WBC: 6.3 10*3/uL (ref 3.4–10.8)

## 2020-08-30 LAB — BASIC METABOLIC PANEL
BUN/Creatinine Ratio: 19 (ref 10–24)
BUN: 32 mg/dL — ABNORMAL HIGH (ref 8–27)
CO2: 24 mmol/L (ref 20–29)
Calcium: 9 mg/dL (ref 8.6–10.2)
Chloride: 96 mmol/L (ref 96–106)
Creatinine, Ser: 1.66 mg/dL — ABNORMAL HIGH (ref 0.76–1.27)
Glucose: 124 mg/dL — ABNORMAL HIGH (ref 65–99)
Potassium: 5.4 mmol/L — ABNORMAL HIGH (ref 3.5–5.2)
Sodium: 134 mmol/L (ref 134–144)
eGFR: 45 mL/min/{1.73_m2} — ABNORMAL LOW (ref >59)

## 2020-08-30 NOTE — Assessment & Plan Note (Signed)
AKI on CKD 3a.  BMP today to monitor. Cr stable at 1.66. Appears baseline is 1.2-1.5. K slightly elevated at 5.4.  Will have patient decrease to Spironolactone 12.5mg  QD and follow up for repeat BMP on 5/10 with at [facility] or at [facility] Continue remaining meds.

## 2020-08-30 NOTE — Assessment & Plan Note (Addendum)
Improved. Completed Keflex. Wound appears to be healing well. Vaseline applied and rebandaged.  Follow up with [facility] on 09/03/20 Continue PT Short course of Oxy 5-10mg  BID PRN with scheduled Tylenol to get him through until his appointment at [facility]. Do not plan to continue to refill this.

## 2020-08-30 NOTE — Assessment & Plan Note (Signed)
Currently on Warfarin 5mg  QD INR obtained to monitor. INR at goal. Monitored regularly with cardiology.

## 2020-08-30 NOTE — Assessment & Plan Note (Addendum)
POC Hgb 9.5 today.  CBC to confirm and notable for Hgb of 10.7.  Denies any acute bleed. Due for colonoscopy. Recommended follow up with PCP.

## 2020-08-31 DIAGNOSIS — N1831 Chronic kidney disease, stage 3a: Secondary | ICD-10-CM | POA: Diagnosis not present

## 2020-08-31 DIAGNOSIS — L03115 Cellulitis of right lower limb: Secondary | ICD-10-CM | POA: Diagnosis not present

## 2020-08-31 DIAGNOSIS — I130 Hypertensive heart and chronic kidney disease with heart failure and stage 1 through stage 4 chronic kidney disease, or unspecified chronic kidney disease: Secondary | ICD-10-CM | POA: Diagnosis not present

## 2020-08-31 DIAGNOSIS — E1140 Type 2 diabetes mellitus with diabetic neuropathy, unspecified: Secondary | ICD-10-CM | POA: Diagnosis not present

## 2020-08-31 DIAGNOSIS — E1122 Type 2 diabetes mellitus with diabetic chronic kidney disease: Secondary | ICD-10-CM | POA: Diagnosis not present

## 2020-08-31 DIAGNOSIS — I5042 Chronic combined systolic (congestive) and diastolic (congestive) heart failure: Secondary | ICD-10-CM | POA: Diagnosis not present

## 2020-08-31 DIAGNOSIS — I70222 Atherosclerosis of native arteries of extremities with rest pain, left leg: Secondary | ICD-10-CM | POA: Diagnosis not present

## 2020-08-31 DIAGNOSIS — E1152 Type 2 diabetes mellitus with diabetic peripheral angiopathy with gangrene: Secondary | ICD-10-CM | POA: Diagnosis not present

## 2020-08-31 DIAGNOSIS — D631 Anemia in chronic kidney disease: Secondary | ICD-10-CM | POA: Diagnosis not present

## 2020-09-02 DIAGNOSIS — I5042 Chronic combined systolic (congestive) and diastolic (congestive) heart failure: Secondary | ICD-10-CM | POA: Diagnosis not present

## 2020-09-02 DIAGNOSIS — E1122 Type 2 diabetes mellitus with diabetic chronic kidney disease: Secondary | ICD-10-CM | POA: Diagnosis not present

## 2020-09-02 DIAGNOSIS — E1152 Type 2 diabetes mellitus with diabetic peripheral angiopathy with gangrene: Secondary | ICD-10-CM | POA: Diagnosis not present

## 2020-09-02 DIAGNOSIS — E1140 Type 2 diabetes mellitus with diabetic neuropathy, unspecified: Secondary | ICD-10-CM | POA: Diagnosis not present

## 2020-09-02 DIAGNOSIS — L03115 Cellulitis of right lower limb: Secondary | ICD-10-CM | POA: Diagnosis not present

## 2020-09-02 DIAGNOSIS — I70222 Atherosclerosis of native arteries of extremities with rest pain, left leg: Secondary | ICD-10-CM | POA: Diagnosis not present

## 2020-09-02 DIAGNOSIS — N1831 Chronic kidney disease, stage 3a: Secondary | ICD-10-CM | POA: Diagnosis not present

## 2020-09-02 DIAGNOSIS — I130 Hypertensive heart and chronic kidney disease with heart failure and stage 1 through stage 4 chronic kidney disease, or unspecified chronic kidney disease: Secondary | ICD-10-CM | POA: Diagnosis not present

## 2020-09-02 DIAGNOSIS — D631 Anemia in chronic kidney disease: Secondary | ICD-10-CM | POA: Diagnosis not present

## 2020-09-03 ENCOUNTER — Encounter: Payer: Medicare PPO | Admitting: Vascular Surgery

## 2020-09-03 ENCOUNTER — Other Ambulatory Visit: Payer: Medicare PPO

## 2020-09-04 ENCOUNTER — Other Ambulatory Visit: Payer: Medicare PPO

## 2020-09-04 DIAGNOSIS — E1122 Type 2 diabetes mellitus with diabetic chronic kidney disease: Secondary | ICD-10-CM | POA: Diagnosis not present

## 2020-09-04 DIAGNOSIS — L03115 Cellulitis of right lower limb: Secondary | ICD-10-CM | POA: Diagnosis not present

## 2020-09-04 DIAGNOSIS — E1152 Type 2 diabetes mellitus with diabetic peripheral angiopathy with gangrene: Secondary | ICD-10-CM | POA: Diagnosis not present

## 2020-09-04 DIAGNOSIS — N1831 Chronic kidney disease, stage 3a: Secondary | ICD-10-CM | POA: Diagnosis not present

## 2020-09-04 DIAGNOSIS — I130 Hypertensive heart and chronic kidney disease with heart failure and stage 1 through stage 4 chronic kidney disease, or unspecified chronic kidney disease: Secondary | ICD-10-CM | POA: Diagnosis not present

## 2020-09-04 DIAGNOSIS — D631 Anemia in chronic kidney disease: Secondary | ICD-10-CM | POA: Diagnosis not present

## 2020-09-04 DIAGNOSIS — I70222 Atherosclerosis of native arteries of extremities with rest pain, left leg: Secondary | ICD-10-CM | POA: Diagnosis not present

## 2020-09-04 DIAGNOSIS — E1140 Type 2 diabetes mellitus with diabetic neuropathy, unspecified: Secondary | ICD-10-CM | POA: Diagnosis not present

## 2020-09-04 DIAGNOSIS — I5042 Chronic combined systolic (congestive) and diastolic (congestive) heart failure: Secondary | ICD-10-CM | POA: Diagnosis not present

## 2020-09-05 ENCOUNTER — Other Ambulatory Visit: Payer: Self-pay

## 2020-09-05 ENCOUNTER — Other Ambulatory Visit: Payer: Medicare PPO

## 2020-09-05 DIAGNOSIS — N179 Acute kidney failure, unspecified: Secondary | ICD-10-CM | POA: Diagnosis not present

## 2020-09-05 DIAGNOSIS — E875 Hyperkalemia: Secondary | ICD-10-CM | POA: Diagnosis not present

## 2020-09-06 LAB — BASIC METABOLIC PANEL
BUN/Creatinine Ratio: 21 (ref 10–24)
BUN: 29 mg/dL — ABNORMAL HIGH (ref 8–27)
CO2: 24 mmol/L (ref 20–29)
Calcium: 9.2 mg/dL (ref 8.6–10.2)
Chloride: 98 mmol/L (ref 96–106)
Creatinine, Ser: 1.41 mg/dL — ABNORMAL HIGH (ref 0.76–1.27)
Glucose: 139 mg/dL — ABNORMAL HIGH (ref 65–99)
Potassium: 4.4 mmol/L (ref 3.5–5.2)
Sodium: 136 mmol/L (ref 134–144)
eGFR: 54 mL/min/{1.73_m2} — ABNORMAL LOW (ref >59)

## 2020-09-10 ENCOUNTER — Ambulatory Visit (INDEPENDENT_AMBULATORY_CARE_PROVIDER_SITE_OTHER): Payer: Self-pay | Admitting: Vascular Surgery

## 2020-09-10 ENCOUNTER — Encounter: Payer: Self-pay | Admitting: Vascular Surgery

## 2020-09-10 ENCOUNTER — Other Ambulatory Visit: Payer: Self-pay

## 2020-09-10 VITALS — BP 114/66 | HR 61 | Temp 98.0°F | Resp 18 | Ht 72.0 in | Wt 222.0 lb

## 2020-09-10 DIAGNOSIS — I739 Peripheral vascular disease, unspecified: Secondary | ICD-10-CM

## 2020-09-10 NOTE — Progress Notes (Signed)
Patient name: Rodney [name] MRN: [id number] DOB: 08-01-1950 Sex: male  REASON FOR VISIT: Postop check  HPI: Rodney [name] is a 70 y.o. male with multiple medical comorbidities that presents for postop check.  He recently underwent right common iliac stent on 08/05/2020 by myself for CLI with tissue loss.  He then had a right SFA to BK pop bypass with vein by Dr. Donzetta Matters on 08/07/2020 given high femoral bifurcation.  States he had a lot of swelling initially postoperatively and had a lot of blisters form on the lower leg that are now starting to heal.  Initially had tissue loss with an ulcer on the right great toe that he feels is now healing.  Past Medical History:  Diagnosis Date  . CHF (congestive heart failure) ([place])   . Coronary artery disease   . Diabetes mellitus without complication ([place])   . HLD (hyperlipidemia)   . Hypertension   . Peripheral edema 02/24/2018  . SOB (shortness of breath) 07/26/2018    Past Surgical History:  Procedure Laterality Date  . ABDOMINAL AORTOGRAM W/LOWER EXTREMITY N/A 08/05/2020   Procedure: ABDOMINAL AORTOGRAM W/LOWER EXTREMITY;  Surgeon: Marty Heck, MD;  Location: [place] [facility];  Service: Cardiovascular;  Laterality: N/A;  . AMPUTATION Left 09/28/2019   Procedure: AMPUTATION BELOW KNEE;  Surgeon: Newt Minion, MD;  Location: [place];  Service: Orthopedics;  Laterality: Left;  . CARDIAC CATHETERIZATION    . CARDIOVERSION N/A 10/05/2019   Procedure: CARDIOVERSION;  Surgeon: Sanda Klein, MD;  Location: [place] [facility];  Service: Cardiovascular;  Laterality: N/A;  . FEMORAL-POPLITEAL BYPASS GRAFT Right 08/07/2020   Procedure: RIGHT FEMORAL TO BELOW KNEE POPLITEAL ARTERY BYPASS;  Surgeon: Waynetta Sandy, MD;  Location: [place];  Service: Vascular;  Laterality: Right;  . LEFT HEART CATH AND CORONARY ANGIOGRAPHY N/A 10/03/2019   Procedure: LEFT HEART CATH AND CORONARY ANGIOGRAPHY;  Surgeon: Lorretta Harp, MD;  Location: [place] [facility];  Service: Cardiovascular;  Laterality: N/A;  . PERIPHERAL VASCULAR INTERVENTION Right 08/05/2020   Procedure: PERIPHERAL VASCULAR INTERVENTION;  Surgeon: Marty Heck, MD;  Location: [place] [facility];  Service: Cardiovascular;  Laterality: Right;  common Iliac  . TEE WITHOUT CARDIOVERSION N/A 10/05/2019   Procedure: TRANSESOPHAGEAL ECHOCARDIOGRAM (TEE);  Surgeon: Sanda Klein, MD;  Location: [facility];  Service: Cardiovascular;  Laterality: N/A;    Family History  Problem Relation Age of Onset  . Alcoholism Mother   . Alcoholism Father     SOCIAL HISTORY: Social History   Tobacco Use  . Smoking status: Current Every Day Smoker    Packs/day: 0.50    Years: 50.00    Pack years: 25.00    Types: Cigarettes  . Smokeless tobacco: Never Used  Substance Use Topics  . Alcohol use: Yes    Alcohol/week: 6.0 standard drinks    Types: 6 Standard drinks or equivalent per week    No Known Allergies  Current Outpatient Medications  Medication Sig Dispense Refill  . amiodarone (PACERONE) 200 MG tablet Take 1 tablet (200 mg total) by mouth daily. 90 tablet 3  . atorvastatin (LIPITOR) 80 MG tablet TAKE 1 TABLET EVERY DAY (Patient taking differently: Take 80 mg by mouth daily.) 90 tablet 3  . Blood Glucose Monitoring Suppl (TRUE METRIX METER) DEVI Use to test blood sugar three times daily. 1 each 0  . Blood Glucose Monitoring Suppl (TRUE METRIX METER) w/Device KIT USE AS DIRECTED 1 kit 0  . carvedilol (COREG)  12.5 MG tablet Take 1 tablet (12.5 mg total) by mouth 2 (two) times daily with a meal. (Patient taking differently: Take 12.5 mg by mouth daily.) 180 tablet 3  . clopidogrel (PLAVIX) 75 MG tablet Take 1 tablet (75 mg total) by mouth daily. 30 tablet 0  . furosemide (LASIX) 40 MG tablet Take 1 tablet (40 mg total) by mouth daily. 30 tablet 0  . glucose blood (RELION TRUE METRIX TEST STRIPS) test strip Use to test blood sugar three times per day. 300 each 3  .  insulin NPH-regular Human (NOVOLIN 70/30 RELION) (70-30) 100 UNIT/ML injection Inject 15-30 Units into the skin See admin instructions. Inject 30 units into the skin with breakfast and 15 units with supper- "until eventually replaced by Lantus"    . losartan (COZAAR) 25 MG tablet Take 1/2 tablet (12.5 mg total) by mouth daily. 30 tablet 0  . metFORMIN (GLUCOPHAGE) 1000 MG tablet Take 1 tablet (1,000 mg total) by mouth daily. 30 tablet 0  . multivitamin (RENA-VIT) TABS tablet Take 1 tablet by mouth at bedtime.  0  . nicotine (NICODERM CQ - DOSED IN MG/24 HR) 7 mg/24hr patch Place 7 mg onto the skin daily.    Marland Kitchen spironolactone (ALDACTONE) 25 MG tablet Take 1 tablet (25 mg total) by mouth daily. 30 tablet 0  . TRUEplus Lancets 33G MISC Use to test blood sugar three times per day. 300 each 3  . warfarin (COUMADIN) 5 MG tablet Take 1 tablet (5 mg total) by mouth daily. 60 tablet 0   No current facility-administered medications for this visit.    REVIEW OF SYSTEMS:  [X]  denotes positive finding, [ ]  denotes negative finding Cardiac  Comments:  Chest pain or chest pressure:    Shortness of breath upon exertion:    Short of breath when lying flat:    Irregular heart rhythm:        Vascular    Pain in calf, thigh, or hip brought on by ambulation:    Pain in feet at night that wakes you up from your sleep:     Blood clot in your veins:    Leg swelling:         Pulmonary    Oxygen at home:    Productive cough:     Wheezing:         Neurologic    Sudden weakness in arms or legs:     Sudden numbness in arms or legs:     Sudden onset of difficulty speaking or slurred speech:    Temporary loss of vision in one eye:     Problems with dizziness:         Gastrointestinal    Blood in stool:     Vomited blood:         Genitourinary    Burning when urinating:     Blood in urine:        Psychiatric    Major depression:         Hematologic    Bleeding problems:    Problems with blood  clotting too easily:        Skin    Rashes or ulcers:        Constitutional    Fever or chills:      PHYSICAL EXAM: Vitals:   09/10/20 0824  BP: 114/66  Pulse: 61  Resp: 18  Temp: 98 F (36.7 C)  SpO2: 96%  Weight: 222 lb (100.7 kg)  Height: 6' (  1.829 m)    GENERAL: The patient is a well-nourished male, in no acute distress. The vital signs are documented above. CARDIAC: There is a regular rate and rhythm.  VASCULAR:  Dry gangrene bottom of right great toe Superficial ulcerations right leg Brisk right DP and PT signals        DATA:   None  Assessment/Plan:  70 year old male now status post right common iliac artery angioplasty with stent placement and a right SFA to below-knee pop bypass with vein for CLI with tissue loss.  He has pretty brisk Doppler signals in the right foot with a dorsalis pedis and posterior tibial signal.  He had a significant amount of swelling postop that is now improving.  I have asked that he continue to elevate his leg.  I asked that he keep a dry dressing in the right groin.  Discussed he can wash his right leg with Dial soap and keep this nice and dry.  I think most of these areas look very superficial and should heal.  We will bring him back in 1 month for wound check of the right leg.  I do not think he needs a toe amp at this time and would be hopeful this will heal with continued wound care.   Marty Heck, MD [facility] Office: [phone]

## 2020-09-11 DIAGNOSIS — I130 Hypertensive heart and chronic kidney disease with heart failure and stage 1 through stage 4 chronic kidney disease, or unspecified chronic kidney disease: Secondary | ICD-10-CM | POA: Diagnosis not present

## 2020-09-11 DIAGNOSIS — D631 Anemia in chronic kidney disease: Secondary | ICD-10-CM | POA: Diagnosis not present

## 2020-09-11 DIAGNOSIS — L03115 Cellulitis of right lower limb: Secondary | ICD-10-CM | POA: Diagnosis not present

## 2020-09-11 DIAGNOSIS — E1152 Type 2 diabetes mellitus with diabetic peripheral angiopathy with gangrene: Secondary | ICD-10-CM | POA: Diagnosis not present

## 2020-09-11 DIAGNOSIS — I70222 Atherosclerosis of native arteries of extremities with rest pain, left leg: Secondary | ICD-10-CM | POA: Diagnosis not present

## 2020-09-11 DIAGNOSIS — N1831 Chronic kidney disease, stage 3a: Secondary | ICD-10-CM | POA: Diagnosis not present

## 2020-09-11 DIAGNOSIS — E1140 Type 2 diabetes mellitus with diabetic neuropathy, unspecified: Secondary | ICD-10-CM | POA: Diagnosis not present

## 2020-09-11 DIAGNOSIS — I5042 Chronic combined systolic (congestive) and diastolic (congestive) heart failure: Secondary | ICD-10-CM | POA: Diagnosis not present

## 2020-09-11 DIAGNOSIS — E1122 Type 2 diabetes mellitus with diabetic chronic kidney disease: Secondary | ICD-10-CM | POA: Diagnosis not present

## 2020-09-12 DIAGNOSIS — N1831 Chronic kidney disease, stage 3a: Secondary | ICD-10-CM | POA: Diagnosis not present

## 2020-09-12 DIAGNOSIS — E1140 Type 2 diabetes mellitus with diabetic neuropathy, unspecified: Secondary | ICD-10-CM | POA: Diagnosis not present

## 2020-09-12 DIAGNOSIS — L03115 Cellulitis of right lower limb: Secondary | ICD-10-CM | POA: Diagnosis not present

## 2020-09-12 DIAGNOSIS — E1122 Type 2 diabetes mellitus with diabetic chronic kidney disease: Secondary | ICD-10-CM | POA: Diagnosis not present

## 2020-09-12 DIAGNOSIS — D631 Anemia in chronic kidney disease: Secondary | ICD-10-CM | POA: Diagnosis not present

## 2020-09-12 DIAGNOSIS — E1152 Type 2 diabetes mellitus with diabetic peripheral angiopathy with gangrene: Secondary | ICD-10-CM | POA: Diagnosis not present

## 2020-09-12 DIAGNOSIS — I70222 Atherosclerosis of native arteries of extremities with rest pain, left leg: Secondary | ICD-10-CM | POA: Diagnosis not present

## 2020-09-12 DIAGNOSIS — I5042 Chronic combined systolic (congestive) and diastolic (congestive) heart failure: Secondary | ICD-10-CM | POA: Diagnosis not present

## 2020-09-12 DIAGNOSIS — I130 Hypertensive heart and chronic kidney disease with heart failure and stage 1 through stage 4 chronic kidney disease, or unspecified chronic kidney disease: Secondary | ICD-10-CM | POA: Diagnosis not present

## 2020-09-13 DIAGNOSIS — E1122 Type 2 diabetes mellitus with diabetic chronic kidney disease: Secondary | ICD-10-CM | POA: Diagnosis not present

## 2020-09-13 DIAGNOSIS — I130 Hypertensive heart and chronic kidney disease with heart failure and stage 1 through stage 4 chronic kidney disease, or unspecified chronic kidney disease: Secondary | ICD-10-CM | POA: Diagnosis not present

## 2020-09-13 DIAGNOSIS — N1831 Chronic kidney disease, stage 3a: Secondary | ICD-10-CM | POA: Diagnosis not present

## 2020-09-13 DIAGNOSIS — I70222 Atherosclerosis of native arteries of extremities with rest pain, left leg: Secondary | ICD-10-CM | POA: Diagnosis not present

## 2020-09-13 DIAGNOSIS — E1140 Type 2 diabetes mellitus with diabetic neuropathy, unspecified: Secondary | ICD-10-CM | POA: Diagnosis not present

## 2020-09-13 DIAGNOSIS — I5042 Chronic combined systolic (congestive) and diastolic (congestive) heart failure: Secondary | ICD-10-CM | POA: Diagnosis not present

## 2020-09-13 DIAGNOSIS — D631 Anemia in chronic kidney disease: Secondary | ICD-10-CM | POA: Diagnosis not present

## 2020-09-13 DIAGNOSIS — E1152 Type 2 diabetes mellitus with diabetic peripheral angiopathy with gangrene: Secondary | ICD-10-CM | POA: Diagnosis not present

## 2020-09-13 DIAGNOSIS — L03115 Cellulitis of right lower limb: Secondary | ICD-10-CM | POA: Diagnosis not present

## 2020-09-17 ENCOUNTER — Other Ambulatory Visit: Payer: Self-pay | Admitting: Family Medicine

## 2020-09-17 NOTE — Telephone Encounter (Signed)
I had called patient's wife on another matter.  She asked how to get all his hospital discharge medicines refilled through the mail order.  She did not have a list of what meds he needed.  I asked her to put the list together, call the nurse and leave details of what is needed.  I am happy to refill via mail order.

## 2020-09-18 ENCOUNTER — Other Ambulatory Visit: Payer: Self-pay

## 2020-09-18 DIAGNOSIS — IMO0002 Reserved for concepts with insufficient information to code with codable children: Secondary | ICD-10-CM

## 2020-09-18 DIAGNOSIS — E1140 Type 2 diabetes mellitus with diabetic neuropathy, unspecified: Secondary | ICD-10-CM

## 2020-09-18 DIAGNOSIS — I5020 Unspecified systolic (congestive) heart failure: Secondary | ICD-10-CM

## 2020-09-18 DIAGNOSIS — E1165 Type 2 diabetes mellitus with hyperglycemia: Secondary | ICD-10-CM

## 2020-09-18 MED ORDER — CLOPIDOGREL BISULFATE 75 MG PO TABS: 75.0000 mg | ORAL_TABLET | Freq: Every day | ORAL | 3 refills | Status: DC

## 2020-09-18 MED ORDER — SPIRONOLACTONE 25 MG PO TABS: 25.0000 mg | ORAL_TABLET | Freq: Every day | ORAL | 3 refills | Status: DC

## 2020-09-18 MED ORDER — METFORMIN HCL 1000 MG PO TABS: 1000.0000 mg | ORAL_TABLET | Freq: Every day | ORAL | 3 refills | Status: DC

## 2020-09-18 MED ORDER — FUROSEMIDE 40 MG PO TABS: 40.0000 mg | ORAL_TABLET | Freq: Every day | ORAL | 3 refills | Status: DC

## 2020-09-18 MED ORDER — CARVEDILOL 12.5 MG PO TABS: 12.5000 mg | ORAL_TABLET | Freq: Two times a day (BID) | ORAL | 3 refills | Status: DC

## 2020-09-19 DIAGNOSIS — E1140 Type 2 diabetes mellitus with diabetic neuropathy, unspecified: Secondary | ICD-10-CM | POA: Diagnosis not present

## 2020-09-19 DIAGNOSIS — I5042 Chronic combined systolic (congestive) and diastolic (congestive) heart failure: Secondary | ICD-10-CM | POA: Diagnosis not present

## 2020-09-19 DIAGNOSIS — I130 Hypertensive heart and chronic kidney disease with heart failure and stage 1 through stage 4 chronic kidney disease, or unspecified chronic kidney disease: Secondary | ICD-10-CM | POA: Diagnosis not present

## 2020-09-19 DIAGNOSIS — E1152 Type 2 diabetes mellitus with diabetic peripheral angiopathy with gangrene: Secondary | ICD-10-CM | POA: Diagnosis not present

## 2020-09-19 DIAGNOSIS — I70222 Atherosclerosis of native arteries of extremities with rest pain, left leg: Secondary | ICD-10-CM | POA: Diagnosis not present

## 2020-09-19 DIAGNOSIS — N1831 Chronic kidney disease, stage 3a: Secondary | ICD-10-CM | POA: Diagnosis not present

## 2020-09-19 DIAGNOSIS — D631 Anemia in chronic kidney disease: Secondary | ICD-10-CM | POA: Diagnosis not present

## 2020-09-19 DIAGNOSIS — E1122 Type 2 diabetes mellitus with diabetic chronic kidney disease: Secondary | ICD-10-CM | POA: Diagnosis not present

## 2020-09-19 DIAGNOSIS — L03115 Cellulitis of right lower limb: Secondary | ICD-10-CM | POA: Diagnosis not present

## 2020-09-20 DIAGNOSIS — E1122 Type 2 diabetes mellitus with diabetic chronic kidney disease: Secondary | ICD-10-CM | POA: Diagnosis not present

## 2020-09-20 DIAGNOSIS — L03115 Cellulitis of right lower limb: Secondary | ICD-10-CM | POA: Diagnosis not present

## 2020-09-20 DIAGNOSIS — E1152 Type 2 diabetes mellitus with diabetic peripheral angiopathy with gangrene: Secondary | ICD-10-CM | POA: Diagnosis not present

## 2020-09-20 DIAGNOSIS — E1140 Type 2 diabetes mellitus with diabetic neuropathy, unspecified: Secondary | ICD-10-CM | POA: Diagnosis not present

## 2020-09-20 DIAGNOSIS — N1831 Chronic kidney disease, stage 3a: Secondary | ICD-10-CM | POA: Diagnosis not present

## 2020-09-20 DIAGNOSIS — I130 Hypertensive heart and chronic kidney disease with heart failure and stage 1 through stage 4 chronic kidney disease, or unspecified chronic kidney disease: Secondary | ICD-10-CM | POA: Diagnosis not present

## 2020-09-20 DIAGNOSIS — I70222 Atherosclerosis of native arteries of extremities with rest pain, left leg: Secondary | ICD-10-CM | POA: Diagnosis not present

## 2020-09-20 DIAGNOSIS — I5042 Chronic combined systolic (congestive) and diastolic (congestive) heart failure: Secondary | ICD-10-CM | POA: Diagnosis not present

## 2020-09-20 DIAGNOSIS — D631 Anemia in chronic kidney disease: Secondary | ICD-10-CM | POA: Diagnosis not present

## 2020-09-24 DIAGNOSIS — L03115 Cellulitis of right lower limb: Secondary | ICD-10-CM | POA: Diagnosis not present

## 2020-09-24 DIAGNOSIS — D631 Anemia in chronic kidney disease: Secondary | ICD-10-CM | POA: Diagnosis not present

## 2020-09-24 DIAGNOSIS — N1831 Chronic kidney disease, stage 3a: Secondary | ICD-10-CM | POA: Diagnosis not present

## 2020-09-24 DIAGNOSIS — I5042 Chronic combined systolic (congestive) and diastolic (congestive) heart failure: Secondary | ICD-10-CM | POA: Diagnosis not present

## 2020-09-24 DIAGNOSIS — E1152 Type 2 diabetes mellitus with diabetic peripheral angiopathy with gangrene: Secondary | ICD-10-CM | POA: Diagnosis not present

## 2020-09-24 DIAGNOSIS — I130 Hypertensive heart and chronic kidney disease with heart failure and stage 1 through stage 4 chronic kidney disease, or unspecified chronic kidney disease: Secondary | ICD-10-CM | POA: Diagnosis not present

## 2020-09-24 DIAGNOSIS — E1122 Type 2 diabetes mellitus with diabetic chronic kidney disease: Secondary | ICD-10-CM | POA: Diagnosis not present

## 2020-09-24 DIAGNOSIS — I70222 Atherosclerosis of native arteries of extremities with rest pain, left leg: Secondary | ICD-10-CM | POA: Diagnosis not present

## 2020-09-24 DIAGNOSIS — E1140 Type 2 diabetes mellitus with diabetic neuropathy, unspecified: Secondary | ICD-10-CM | POA: Diagnosis not present

## 2020-10-01 DIAGNOSIS — D631 Anemia in chronic kidney disease: Secondary | ICD-10-CM | POA: Diagnosis not present

## 2020-10-01 DIAGNOSIS — N1831 Chronic kidney disease, stage 3a: Secondary | ICD-10-CM | POA: Diagnosis not present

## 2020-10-01 DIAGNOSIS — E1152 Type 2 diabetes mellitus with diabetic peripheral angiopathy with gangrene: Secondary | ICD-10-CM | POA: Diagnosis not present

## 2020-10-01 DIAGNOSIS — L03115 Cellulitis of right lower limb: Secondary | ICD-10-CM | POA: Diagnosis not present

## 2020-10-01 DIAGNOSIS — E1122 Type 2 diabetes mellitus with diabetic chronic kidney disease: Secondary | ICD-10-CM | POA: Diagnosis not present

## 2020-10-01 DIAGNOSIS — I130 Hypertensive heart and chronic kidney disease with heart failure and stage 1 through stage 4 chronic kidney disease, or unspecified chronic kidney disease: Secondary | ICD-10-CM | POA: Diagnosis not present

## 2020-10-01 DIAGNOSIS — E1140 Type 2 diabetes mellitus with diabetic neuropathy, unspecified: Secondary | ICD-10-CM | POA: Diagnosis not present

## 2020-10-01 DIAGNOSIS — I70222 Atherosclerosis of native arteries of extremities with rest pain, left leg: Secondary | ICD-10-CM | POA: Diagnosis not present

## 2020-10-01 DIAGNOSIS — I5042 Chronic combined systolic (congestive) and diastolic (congestive) heart failure: Secondary | ICD-10-CM | POA: Diagnosis not present

## 2020-10-03 ENCOUNTER — Other Ambulatory Visit: Payer: Self-pay

## 2020-10-03 DIAGNOSIS — I5020 Unspecified systolic (congestive) heart failure: Secondary | ICD-10-CM

## 2020-10-03 MED ORDER — AMIODARONE HCL 200 MG PO TABS: 200.0000 mg | ORAL_TABLET | Freq: Every day | ORAL | 3 refills | Status: DC

## 2020-10-04 DIAGNOSIS — D631 Anemia in chronic kidney disease: Secondary | ICD-10-CM | POA: Diagnosis not present

## 2020-10-04 DIAGNOSIS — E1140 Type 2 diabetes mellitus with diabetic neuropathy, unspecified: Secondary | ICD-10-CM | POA: Diagnosis not present

## 2020-10-04 DIAGNOSIS — E1152 Type 2 diabetes mellitus with diabetic peripheral angiopathy with gangrene: Secondary | ICD-10-CM | POA: Diagnosis not present

## 2020-10-04 DIAGNOSIS — I70222 Atherosclerosis of native arteries of extremities with rest pain, left leg: Secondary | ICD-10-CM | POA: Diagnosis not present

## 2020-10-04 DIAGNOSIS — L03115 Cellulitis of right lower limb: Secondary | ICD-10-CM | POA: Diagnosis not present

## 2020-10-04 DIAGNOSIS — I5042 Chronic combined systolic (congestive) and diastolic (congestive) heart failure: Secondary | ICD-10-CM | POA: Diagnosis not present

## 2020-10-04 DIAGNOSIS — N1831 Chronic kidney disease, stage 3a: Secondary | ICD-10-CM | POA: Diagnosis not present

## 2020-10-04 DIAGNOSIS — I130 Hypertensive heart and chronic kidney disease with heart failure and stage 1 through stage 4 chronic kidney disease, or unspecified chronic kidney disease: Secondary | ICD-10-CM | POA: Diagnosis not present

## 2020-10-04 DIAGNOSIS — E1122 Type 2 diabetes mellitus with diabetic chronic kidney disease: Secondary | ICD-10-CM | POA: Diagnosis not present

## 2020-10-07 ENCOUNTER — Telehealth: Payer: Self-pay

## 2020-10-07 NOTE — Telephone Encounter (Signed)
Rodney [name] [facility] PT calls nurse line requesting verbal orders to extend [facility] PT as follows.   2 week 2   Verbal orders given per [facility] protocol.

## 2020-10-08 ENCOUNTER — Other Ambulatory Visit (HOSPITAL_BASED_OUTPATIENT_CLINIC_OR_DEPARTMENT_OTHER): Payer: Self-pay

## 2020-10-08 DIAGNOSIS — N1831 Chronic kidney disease, stage 3a: Secondary | ICD-10-CM | POA: Diagnosis not present

## 2020-10-08 DIAGNOSIS — I5042 Chronic combined systolic (congestive) and diastolic (congestive) heart failure: Secondary | ICD-10-CM | POA: Diagnosis not present

## 2020-10-08 DIAGNOSIS — D631 Anemia in chronic kidney disease: Secondary | ICD-10-CM | POA: Diagnosis not present

## 2020-10-08 DIAGNOSIS — L03115 Cellulitis of right lower limb: Secondary | ICD-10-CM | POA: Diagnosis not present

## 2020-10-08 DIAGNOSIS — E1140 Type 2 diabetes mellitus with diabetic neuropathy, unspecified: Secondary | ICD-10-CM | POA: Diagnosis not present

## 2020-10-08 DIAGNOSIS — I70222 Atherosclerosis of native arteries of extremities with rest pain, left leg: Secondary | ICD-10-CM | POA: Diagnosis not present

## 2020-10-08 DIAGNOSIS — E1152 Type 2 diabetes mellitus with diabetic peripheral angiopathy with gangrene: Secondary | ICD-10-CM | POA: Diagnosis not present

## 2020-10-08 DIAGNOSIS — E1122 Type 2 diabetes mellitus with diabetic chronic kidney disease: Secondary | ICD-10-CM | POA: Diagnosis not present

## 2020-10-08 DIAGNOSIS — I130 Hypertensive heart and chronic kidney disease with heart failure and stage 1 through stage 4 chronic kidney disease, or unspecified chronic kidney disease: Secondary | ICD-10-CM | POA: Diagnosis not present

## 2020-10-10 DIAGNOSIS — I130 Hypertensive heart and chronic kidney disease with heart failure and stage 1 through stage 4 chronic kidney disease, or unspecified chronic kidney disease: Secondary | ICD-10-CM | POA: Diagnosis not present

## 2020-10-10 DIAGNOSIS — E1140 Type 2 diabetes mellitus with diabetic neuropathy, unspecified: Secondary | ICD-10-CM | POA: Diagnosis not present

## 2020-10-10 DIAGNOSIS — I5042 Chronic combined systolic (congestive) and diastolic (congestive) heart failure: Secondary | ICD-10-CM | POA: Diagnosis not present

## 2020-10-10 DIAGNOSIS — L03115 Cellulitis of right lower limb: Secondary | ICD-10-CM | POA: Diagnosis not present

## 2020-10-10 DIAGNOSIS — E1152 Type 2 diabetes mellitus with diabetic peripheral angiopathy with gangrene: Secondary | ICD-10-CM | POA: Diagnosis not present

## 2020-10-10 DIAGNOSIS — D631 Anemia in chronic kidney disease: Secondary | ICD-10-CM | POA: Diagnosis not present

## 2020-10-10 DIAGNOSIS — N1831 Chronic kidney disease, stage 3a: Secondary | ICD-10-CM | POA: Diagnosis not present

## 2020-10-10 DIAGNOSIS — E1122 Type 2 diabetes mellitus with diabetic chronic kidney disease: Secondary | ICD-10-CM | POA: Diagnosis not present

## 2020-10-10 DIAGNOSIS — I70222 Atherosclerosis of native arteries of extremities with rest pain, left leg: Secondary | ICD-10-CM | POA: Diagnosis not present

## 2020-10-14 ENCOUNTER — Ambulatory Visit (INDEPENDENT_AMBULATORY_CARE_PROVIDER_SITE_OTHER): Payer: Medicare PPO | Admitting: Family Medicine

## 2020-10-14 ENCOUNTER — Encounter: Payer: Self-pay | Admitting: Family Medicine

## 2020-10-14 ENCOUNTER — Other Ambulatory Visit: Payer: Self-pay

## 2020-10-14 VITALS — BP 82/47 | HR 62 | Ht 72.0 in | Wt 217.0 lb

## 2020-10-14 DIAGNOSIS — I5020 Unspecified systolic (congestive) heart failure: Secondary | ICD-10-CM

## 2020-10-14 DIAGNOSIS — I10 Essential (primary) hypertension: Secondary | ICD-10-CM | POA: Diagnosis not present

## 2020-10-14 DIAGNOSIS — Z7901 Long term (current) use of anticoagulants: Secondary | ICD-10-CM

## 2020-10-14 DIAGNOSIS — I2510 Atherosclerotic heart disease of native coronary artery without angina pectoris: Secondary | ICD-10-CM | POA: Diagnosis not present

## 2020-10-14 DIAGNOSIS — E1149 Type 2 diabetes mellitus with other diabetic neurological complication: Secondary | ICD-10-CM

## 2020-10-14 DIAGNOSIS — I82421 Acute embolism and thrombosis of right iliac vein: Secondary | ICD-10-CM | POA: Diagnosis not present

## 2020-10-14 DIAGNOSIS — I4819 Other persistent atrial fibrillation: Secondary | ICD-10-CM | POA: Diagnosis not present

## 2020-10-14 LAB — POCT GLYCOSYLATED HEMOGLOBIN (HGB A1C): HbA1c, POC (controlled diabetic range): 6.1 % (ref 0.0–7.0)

## 2020-10-14 LAB — POCT INR: INR: 2.2 (ref 2.0–3.0)

## 2020-10-14 MED ORDER — CARVEDILOL 12.5 MG PO TABS: 6.2500 mg | ORAL_TABLET | Freq: Two times a day (BID) | ORAL | 3 refills | Status: DC

## 2020-10-14 MED ORDER — AMIODARONE HCL 200 MG PO TABS: 200.0000 mg | ORAL_TABLET | Freq: Every day | ORAL | 3 refills | Status: DC

## 2020-10-14 MED ORDER — FUROSEMIDE 40 MG PO TABS: 40.0000 mg | ORAL_TABLET | Freq: Every day | ORAL | 3 refills | Status: DC | PRN

## 2020-10-14 NOTE — Patient Instructions (Addendum)
To treat your low blood pressure: Stop the spironolactone. Stay on 1/2 tab of the losartan Cut the carvedilol in 1/2 tab twice a day. Only take the furosemide/lasix as needed for swelling.    Because of the bleeding, stop the clopidogrel/Plavix.    I reordered the amiodorone, which is for the a fib.    FU 3-4 weeks.

## 2020-10-15 ENCOUNTER — Encounter: Payer: Self-pay | Admitting: Family Medicine

## 2020-10-15 ENCOUNTER — Ambulatory Visit: Payer: Medicare PPO

## 2020-10-15 NOTE — Progress Notes (Signed)
    SUBJECTIVE:   CHIEF COMPLAINT / HPI:   FU multiple issues Diabetes: Seems to be the least of his problems today.  A1C=6.1  Denies hypoglycemia sx.  No changes from the DM standpoint.   Foot bled ~1 week ago.  Not sure source. Anticoagulated.  On warfarin for A fib and recent DVT.  Also on clopidigrel for CAD and PVD.  No recent INR check.  In our office, INR today = 2.2. Hypotensive.  Some lightheadedness.  On multiple antihypertensive drugs for rate control of a fib and for known HFrEF.  Denies leg swelling.  No episodes of tachycardia that he is aware of.  Needs refill on amiodorone.  BMP last month OK.  Weight continues to drop.  Lost 5 lbs since last month.    OBJECTIVE:   BP (!) 82/47   Pulse 62   Ht 6' (1.829 m)   Wt 217 lb (98.4 kg)   SpO2 96%   BMI 29.43 kg/m   Lungs clear Cardiac RRR without m or g Right leg no edema Right foot.  Old blood on toes.  No gangrene.  Does have dystrophic nails.    ASSESSMENT/PLAN:   Acute deep vein thrombosis (DVT) of iliac vein of right lower extremity (HCC) Given recent DVT and normal INR, continue coumadin despite foot bleeding.  Atrial fibrillation ([place]) Continue amiodorone.  Decrease carvedilol due to hypotension.  Therapeutic on coumadin.  Essential hypertension Over treated.  Decrease several meds.  HFrEF (heart failure with reduced ejection fraction) (HCC) Try to get by with baby dose of losartan and carvedilol.  Prn lasix.    CAD (coronary artery disease) Given bleeding and concomminent use of warfarin, stop plavix   DM (diabetes mellitus), type 2 with neurological complications ([place]) Controled with A1C at goal     [name], [place]

## 2020-10-15 NOTE — Assessment & Plan Note (Signed)
Try to get by with baby dose of losartan and carvedilol.  Prn lasix.

## 2020-10-15 NOTE — Assessment & Plan Note (Signed)
Continue amiodorone.  Decrease carvedilol due to hypotension.  Therapeutic on coumadin.

## 2020-10-15 NOTE — Assessment & Plan Note (Signed)
Given bleeding and concomminent use of warfarin, stop plavix

## 2020-10-15 NOTE — Assessment & Plan Note (Signed)
Controled with A1C at goal

## 2020-10-15 NOTE — Assessment & Plan Note (Signed)
Given recent DVT and normal INR, continue coumadin despite foot bleeding.

## 2020-10-15 NOTE — Assessment & Plan Note (Signed)
Over treated.  Decrease several meds.

## 2020-10-16 DIAGNOSIS — I130 Hypertensive heart and chronic kidney disease with heart failure and stage 1 through stage 4 chronic kidney disease, or unspecified chronic kidney disease: Secondary | ICD-10-CM | POA: Diagnosis not present

## 2020-10-16 DIAGNOSIS — I70222 Atherosclerosis of native arteries of extremities with rest pain, left leg: Secondary | ICD-10-CM | POA: Diagnosis not present

## 2020-10-16 DIAGNOSIS — D631 Anemia in chronic kidney disease: Secondary | ICD-10-CM | POA: Diagnosis not present

## 2020-10-16 DIAGNOSIS — N1831 Chronic kidney disease, stage 3a: Secondary | ICD-10-CM | POA: Diagnosis not present

## 2020-10-16 DIAGNOSIS — I5042 Chronic combined systolic (congestive) and diastolic (congestive) heart failure: Secondary | ICD-10-CM | POA: Diagnosis not present

## 2020-10-16 DIAGNOSIS — E1122 Type 2 diabetes mellitus with diabetic chronic kidney disease: Secondary | ICD-10-CM | POA: Diagnosis not present

## 2020-10-16 DIAGNOSIS — L03115 Cellulitis of right lower limb: Secondary | ICD-10-CM | POA: Diagnosis not present

## 2020-10-16 DIAGNOSIS — E1152 Type 2 diabetes mellitus with diabetic peripheral angiopathy with gangrene: Secondary | ICD-10-CM | POA: Diagnosis not present

## 2020-10-16 DIAGNOSIS — E1140 Type 2 diabetes mellitus with diabetic neuropathy, unspecified: Secondary | ICD-10-CM | POA: Diagnosis not present

## 2020-10-18 DIAGNOSIS — L03115 Cellulitis of right lower limb: Secondary | ICD-10-CM | POA: Diagnosis not present

## 2020-10-18 DIAGNOSIS — E1140 Type 2 diabetes mellitus with diabetic neuropathy, unspecified: Secondary | ICD-10-CM | POA: Diagnosis not present

## 2020-10-18 DIAGNOSIS — D631 Anemia in chronic kidney disease: Secondary | ICD-10-CM | POA: Diagnosis not present

## 2020-10-18 DIAGNOSIS — N1831 Chronic kidney disease, stage 3a: Secondary | ICD-10-CM | POA: Diagnosis not present

## 2020-10-18 DIAGNOSIS — I130 Hypertensive heart and chronic kidney disease with heart failure and stage 1 through stage 4 chronic kidney disease, or unspecified chronic kidney disease: Secondary | ICD-10-CM | POA: Diagnosis not present

## 2020-10-18 DIAGNOSIS — E1122 Type 2 diabetes mellitus with diabetic chronic kidney disease: Secondary | ICD-10-CM | POA: Diagnosis not present

## 2020-10-18 DIAGNOSIS — E1152 Type 2 diabetes mellitus with diabetic peripheral angiopathy with gangrene: Secondary | ICD-10-CM | POA: Diagnosis not present

## 2020-10-18 DIAGNOSIS — I70222 Atherosclerosis of native arteries of extremities with rest pain, left leg: Secondary | ICD-10-CM | POA: Diagnosis not present

## 2020-10-18 DIAGNOSIS — I5042 Chronic combined systolic (congestive) and diastolic (congestive) heart failure: Secondary | ICD-10-CM | POA: Diagnosis not present

## 2020-10-19 DIAGNOSIS — L03115 Cellulitis of right lower limb: Secondary | ICD-10-CM | POA: Diagnosis not present

## 2020-10-19 DIAGNOSIS — E1140 Type 2 diabetes mellitus with diabetic neuropathy, unspecified: Secondary | ICD-10-CM | POA: Diagnosis not present

## 2020-10-19 DIAGNOSIS — I70222 Atherosclerosis of native arteries of extremities with rest pain, left leg: Secondary | ICD-10-CM | POA: Diagnosis not present

## 2020-10-19 DIAGNOSIS — E1152 Type 2 diabetes mellitus with diabetic peripheral angiopathy with gangrene: Secondary | ICD-10-CM | POA: Diagnosis not present

## 2020-10-19 DIAGNOSIS — D631 Anemia in chronic kidney disease: Secondary | ICD-10-CM | POA: Diagnosis not present

## 2020-10-19 DIAGNOSIS — I5042 Chronic combined systolic (congestive) and diastolic (congestive) heart failure: Secondary | ICD-10-CM | POA: Diagnosis not present

## 2020-10-19 DIAGNOSIS — I130 Hypertensive heart and chronic kidney disease with heart failure and stage 1 through stage 4 chronic kidney disease, or unspecified chronic kidney disease: Secondary | ICD-10-CM | POA: Diagnosis not present

## 2020-10-19 DIAGNOSIS — E1122 Type 2 diabetes mellitus with diabetic chronic kidney disease: Secondary | ICD-10-CM | POA: Diagnosis not present

## 2020-10-19 DIAGNOSIS — N1831 Chronic kidney disease, stage 3a: Secondary | ICD-10-CM | POA: Diagnosis not present

## 2020-10-21 ENCOUNTER — Telehealth: Payer: Self-pay

## 2020-10-21 NOTE — Telephone Encounter (Signed)
Mark from [employer] for PT verbal orders as follows:  2 time(s) weekly for 3 week(s), then 1 time(s) weekly for 1 week(s), then 2 times weekly for 1 week, then 1 time weekly for 1 week.   Verbal orders given per [facility] protocol  Talbot Grumbling, RN

## 2020-10-22 DIAGNOSIS — I130 Hypertensive heart and chronic kidney disease with heart failure and stage 1 through stage 4 chronic kidney disease, or unspecified chronic kidney disease: Secondary | ICD-10-CM | POA: Diagnosis not present

## 2020-10-22 DIAGNOSIS — I5042 Chronic combined systolic (congestive) and diastolic (congestive) heart failure: Secondary | ICD-10-CM | POA: Diagnosis not present

## 2020-10-22 DIAGNOSIS — N1831 Chronic kidney disease, stage 3a: Secondary | ICD-10-CM | POA: Diagnosis not present

## 2020-10-22 DIAGNOSIS — I70222 Atherosclerosis of native arteries of extremities with rest pain, left leg: Secondary | ICD-10-CM | POA: Diagnosis not present

## 2020-10-22 DIAGNOSIS — D631 Anemia in chronic kidney disease: Secondary | ICD-10-CM | POA: Diagnosis not present

## 2020-10-22 DIAGNOSIS — L03115 Cellulitis of right lower limb: Secondary | ICD-10-CM | POA: Diagnosis not present

## 2020-10-22 DIAGNOSIS — E1122 Type 2 diabetes mellitus with diabetic chronic kidney disease: Secondary | ICD-10-CM | POA: Diagnosis not present

## 2020-10-22 DIAGNOSIS — E1140 Type 2 diabetes mellitus with diabetic neuropathy, unspecified: Secondary | ICD-10-CM | POA: Diagnosis not present

## 2020-10-22 DIAGNOSIS — E1152 Type 2 diabetes mellitus with diabetic peripheral angiopathy with gangrene: Secondary | ICD-10-CM | POA: Diagnosis not present

## 2020-10-24 DIAGNOSIS — D631 Anemia in chronic kidney disease: Secondary | ICD-10-CM | POA: Diagnosis not present

## 2020-10-24 DIAGNOSIS — E1140 Type 2 diabetes mellitus with diabetic neuropathy, unspecified: Secondary | ICD-10-CM | POA: Diagnosis not present

## 2020-10-24 DIAGNOSIS — N1831 Chronic kidney disease, stage 3a: Secondary | ICD-10-CM | POA: Diagnosis not present

## 2020-10-24 DIAGNOSIS — I130 Hypertensive heart and chronic kidney disease with heart failure and stage 1 through stage 4 chronic kidney disease, or unspecified chronic kidney disease: Secondary | ICD-10-CM | POA: Diagnosis not present

## 2020-10-24 DIAGNOSIS — I70222 Atherosclerosis of native arteries of extremities with rest pain, left leg: Secondary | ICD-10-CM | POA: Diagnosis not present

## 2020-10-24 DIAGNOSIS — E1152 Type 2 diabetes mellitus with diabetic peripheral angiopathy with gangrene: Secondary | ICD-10-CM | POA: Diagnosis not present

## 2020-10-24 DIAGNOSIS — E1122 Type 2 diabetes mellitus with diabetic chronic kidney disease: Secondary | ICD-10-CM | POA: Diagnosis not present

## 2020-10-24 DIAGNOSIS — I5042 Chronic combined systolic (congestive) and diastolic (congestive) heart failure: Secondary | ICD-10-CM | POA: Diagnosis not present

## 2020-10-24 DIAGNOSIS — L03115 Cellulitis of right lower limb: Secondary | ICD-10-CM | POA: Diagnosis not present

## 2020-10-29 ENCOUNTER — Ambulatory Visit (INDEPENDENT_AMBULATORY_CARE_PROVIDER_SITE_OTHER): Payer: Medicare PPO | Admitting: Family Medicine

## 2020-10-29 ENCOUNTER — Other Ambulatory Visit: Payer: Self-pay

## 2020-10-29 ENCOUNTER — Encounter: Payer: Self-pay | Admitting: Family Medicine

## 2020-10-29 VITALS — BP 120/60 | HR 77 | Ht 72.0 in | Wt 224.4 lb

## 2020-10-29 DIAGNOSIS — T148XXA Other injury of unspecified body region, initial encounter: Secondary | ICD-10-CM

## 2020-10-29 DIAGNOSIS — I878 Other specified disorders of veins: Secondary | ICD-10-CM | POA: Diagnosis not present

## 2020-10-29 NOTE — Progress Notes (Signed)
    SUBJECTIVE:   CHIEF COMPLAINT / HPI:   "Blisters on leg": 70 year old male presenting with worsening "blisters on leg".  Home meds include patient has a history of a left lower leg amputation as well as blisters of his right toe.  He previously had popliteal bypass surgery on 08/07/2020. He states these blisters popped up on the right anterior shin yesterday.  He states that he has been taking his Lasix 40 mg as needed.  He did take 1 of these 2 days ago.Marland Kitchen  PERTINENT  PMH / PSH: None relevant  OBJECTIVE:   BP 120/60   Pulse 77   Ht 6' (1.829 m)   Wt 224 lb 6 oz (101.8 kg)   SpO2 96%   BMI 30.43 kg/m    General: NAD, pleasant, able to participate in exam Cardiac: S1, S2 present Respiratory: CTAB, normal effort, No wheezes, rales or rhonchi Extremities: 1+-2+ pitting edema up to the level of the ankle/lower shin of the right side.  Patient has 2 small blisters present on the anterior shin of the left leg.  He has venous stasis changes without any warmth present to that leg.  Left above-knee amputation with prosthetic in place. Neuro: alert, no obvious focal deficits Psych: Normal affect and mood     ASSESSMENT/PLAN:   Blisters with venous stasis: No warmth present to the lower extremity, low concern for cellulitis with no warmth, no fever, no discomfort.  Patient does have 2+ pitting edema up to the region of the blisters and this is likely the etiology of these blisters.  Discussed case with Dr. Nori Riis.  Recommended increase his Lasix dosing taken 40 mg oral Lasix today followed by another dose of 40 mg oral Lasix tomorrow.  I also recommend he continue to elevate the foot.  Recommend that he let us know if he develops any fevers, pain, warmth to the leg or other concerning symptoms.  Lurline Del, [place]

## 2020-10-29 NOTE — Patient Instructions (Signed)
I want you to try taking a dose of your Lasix today.  I want you to follow this with another dose of your Lasix tomorrow.  I would like for you to try to keep that leg elevated above your heart level at least a few times throughout the day if you can do so.  I do think support hose would be helpful for the leg swelling.  I do not see any reason to do an antibiotic at this time.  I would like for you to let us know if it does not improve in the next few days with taking extra Lasix, if you have any fevers, if you have worsening pain, or if you have any other concerning symptoms.  Otherwise keep your appointment with Dr. Andria Frames next week and we can reevaluate at that time

## 2020-11-03 NOTE — Progress Notes (Deleted)
Cardiology Office Note:   Date:  11/03/2020  NAME:  Rodney [name]    MRN: [id number] DOB:  06-Nov-1950   PCP:  [name], MD  Cardiologist:  Evalina Field, MD  Electrophysiologist:  None   Referring MD: [name], MD   No chief complaint on file. ***  History of Present Illness:   Rodney [name] is a 70 y.o. male with a hx of *** who is being seen today for the evaluation of *** at the request of ***.   Problem List 1. Systolic HF -58-09% 01/02/3381 -50-55% 07/30/2020 2. Atrial fibrillation, persistent (CHADSVASC = 5; CAD, HTN, DM, Age >67, CHF) -TEE/DCCV 10/05/2019 -developed postop after L BKA -recurrence 11/03/2019 -back in NSR 02/15/2020 -on coumadin due to cost of DOAC 3. CAD -RCA CTO 4. Moderate Aortic stenosis -07/2020:  Vmax 3.8 m/s, MG 33 mmHG 5. CKD III -GFR 45 6. DM -A1c 6.5 -T chol 91, HDL 20, LDL 48, TG 113 7. L Transtibial amputation 8. Hyperkalemia 2/2 AKI 03/2020 -briefly on hemodialysis  9. Tobacco abuse -50 years  10. PAD/CLI -R fem-pop bypass 08/07/2020  Past Medical History: Past Medical History:  Diagnosis Date   CHF (congestive heart failure) ([place])    Coronary artery disease    Diabetes mellitus without complication ([place])    HLD (hyperlipidemia)    Hypertension    Peripheral edema 02/24/2018   SOB (shortness of breath) 07/26/2018    Past Surgical History: Past Surgical History:  Procedure Laterality Date   ABDOMINAL AORTOGRAM W/LOWER EXTREMITY N/A 08/05/2020   Procedure: ABDOMINAL AORTOGRAM W/LOWER EXTREMITY;  Surgeon: Marty Heck, MD;  Location: [place] [facility];  Service: Cardiovascular;  Laterality: N/A;   AMPUTATION Left 09/28/2019   Procedure: AMPUTATION BELOW KNEE;  Surgeon: Newt Minion, MD;  Location: [facility];  Service: Orthopedics;  Laterality: Left;   CARDIAC CATHETERIZATION     CARDIOVERSION N/A 10/05/2019   Procedure: CARDIOVERSION;  Surgeon: Sanda Klein, MD;  Location: [facility];   Service: Cardiovascular;  Laterality: N/A;   FEMORAL-POPLITEAL BYPASS GRAFT Right 08/07/2020   Procedure: RIGHT FEMORAL TO BELOW KNEE POPLITEAL ARTERY BYPASS;  Surgeon: Waynetta Sandy, MD;  Location: [facility];  Service: Vascular;  Laterality: Right;   LEFT HEART CATH AND CORONARY ANGIOGRAPHY N/A 10/03/2019   Procedure: LEFT HEART CATH AND CORONARY ANGIOGRAPHY;  Surgeon: Lorretta Harp, MD;  Location: [place];  Service: Cardiovascular;  Laterality: N/A;   PERIPHERAL VASCULAR INTERVENTION Right 08/05/2020   Procedure: PERIPHERAL VASCULAR INTERVENTION;  Surgeon: Marty Heck, MD;  Location: [facility];  Service: Cardiovascular;  Laterality: Right;  common Iliac   TEE WITHOUT CARDIOVERSION N/A 10/05/2019   Procedure: TRANSESOPHAGEAL ECHOCARDIOGRAM (TEE);  Surgeon: Sanda Klein, MD;  Location: [facility];  Service: Cardiovascular;  Laterality: N/A;    Current Medications: No outpatient medications have been marked as taking for the 11/05/20 encounter (Appointment) with O'Neal, Cassie Freer, MD.     Allergies:    Patient has no known allergies.   Social History: Social History   Socioeconomic History   Marital status: Widowed    Spouse name: Not on file   Number of children: Not on file   Years of education: Not on file   Highest education level: Not on file  Occupational History   Not on file  Tobacco Use   Smoking status: Every Day    Packs/day: 0.50    Years: 50.00    Pack years:  25.00    Types: Cigarettes   Smokeless tobacco: Never  Substance and Sexual Activity   Alcohol use: Yes    Alcohol/week: 6.0 standard drinks    Types: 6 Standard drinks or equivalent per week   Drug use: No   Sexual activity: Yes    Partners: Female    Comment: monagamous stable relationship  Other Topics Concern   Not on file  Social History Narrative   Not on file   Social Determinants of Health   Financial Resource Strain: Not on file  Food Insecurity:  Not on file  Transportation Needs: Not on file  Physical Activity: Not on file  Stress: Not on file  Social Connections: Not on file     Family History: The patient's ***family history includes Alcoholism in his father and mother.  ROS:   All other ROS reviewed and negative. Pertinent positives noted in the HPI.     EKGs/Labs/Other Studies Reviewed:   The following studies were personally reviewed by me today:  EKG:  EKG is *** ordered today.  The ekg ordered today demonstrates ***, and was personally reviewed by me.   TTE 07/30/2020  1. Left ventricular ejection fraction, by estimation, is 50 to 55%. The  left ventricle has low normal function. The left ventricle has no regional  wall motion abnormalities. There is moderate left ventricular hypertrophy.  Left ventricular diastolic  parameters are consistent with Grade II diastolic dysfunction  (pseudonormalization).   2. Right ventricular systolic function is normal. The right ventricular  size is moderately enlarged. There is normal pulmonary artery systolic  pressure. The estimated right ventricular systolic pressure is 88.4 mmHg.   3. Left atrial size was severely dilated.   4. Right atrial size was severely dilated.   5. The mitral valve is normal in structure. Mild mitral valve  regurgitation. No evidence of mitral stenosis. The mean mitral valve  gradient is 2.0 mmHg. Moderate mitral annular calcification.   6. The aortic valve is calcified. There is moderate calcification of the  aortic valve. There is moderate thickening of the aortic valve. Aortic  valve regurgitation is not visualized. Moderate aortic valve stenosis.  Aortic valve mean gradient measures  33.0 mmHg. Aortic valve Vmax measures 3.80 m/s.   7. Aortic dilatation noted. There is mild dilatation of the ascending  aorta, measuring 40 mm.   8. The inferior vena cava is normal in size with greater than 50%  respiratory variability, suggesting right atrial  pressure of 3 mmHg.   Recent Labs: 04/04/2020: Magnesium 1.6 05/01/2020: TSH 5.320 08/01/2020: B Natriuretic Peptide 489.3 08/14/2020: ALT 18 08/29/2020: Hemoglobin 10.7; Platelets 371 09/05/2020: BUN 29; Creatinine, Ser 1.41; Potassium 4.4; Sodium 136   Recent Lipid Panel    Component Value Date/Time   CHOL 122 08/08/2020 0000   CHOL 140 02/21/2018 1650   TRIG 98 08/08/2020 0000   HDL 28 (L) 08/08/2020 0000   HDL 33 (L) 02/21/2018 1650   CHOLHDL 4.4 08/08/2020 0000   VLDL 20 08/08/2020 0000   LDLCALC 74 08/08/2020 0000   LDLCALC 79 02/21/2018 1650   LDLDIRECT 92 01/03/2014 1514    Physical Exam:   VS:  There were no vitals taken for this visit.   Wt Readings from Last 3 Encounters:  10/29/20 224 lb 6 oz (101.8 kg)  10/14/20 217 lb (98.4 kg)  09/10/20 222 lb (100.7 kg)    General: Well nourished, well developed, in no acute distress Head: Atraumatic, normal size  Eyes: PEERLA,  EOMI  Neck: Supple, no JVD Endocrine: No thryomegaly Cardiac: Normal S1, S2; RRR; no murmurs, rubs, or gallops Lungs: Clear to auscultation bilaterally, no wheezing, rhonchi or rales  Abd: Soft, nontender, no hepatomegaly  Ext: No edema, pulses 2+ Musculoskeletal: No deformities, BUE and BLE strength normal and equal Skin: Warm and dry, no rashes   Neuro: Alert and oriented to person, place, time, and situation, CNII-XII grossly intact, no focal deficits  Psych: Normal mood and affect   ASSESSMENT:   Rodney [name] is a 70 y.o. male who presents for the following: No diagnosis found.  PLAN:   There are no diagnoses linked to this encounter.  {Are you ordering a CV Procedure (e.g. stress test, cath, DCCV, TEE, etc)?   Press F2        :[id number]}  Disposition: No follow-ups on file.  Medication Adjustments/Labs and Tests Ordered: Current medicines are reviewed at length with the patient today.  Concerns regarding medicines are outlined above.  No orders of the defined types were placed in this  encounter.  No orders of the defined types were placed in this encounter.   There are no Patient Instructions on file for this visit.   Time Spent with Patient: I have spent a total of *** minutes with patient reviewing hospital notes, telemetry, EKGs, labs and examining the patient as well as establishing an assessment and plan that was discussed with the patient.  > 50% of time was spent in direct patient care.  Signed, Addison Naegeli. Audie Box, MD, [place]  [address] [place], [place] [address] [phone]  11/03/2020 12:25 PM

## 2020-11-05 ENCOUNTER — Ambulatory Visit: Payer: Medicare PPO | Admitting: Cardiovascular Disease

## 2020-11-07 ENCOUNTER — Telehealth: Payer: Self-pay

## 2020-11-07 ENCOUNTER — Other Ambulatory Visit: Payer: Self-pay

## 2020-11-07 ENCOUNTER — Encounter (HOSPITAL_COMMUNITY): Payer: Self-pay | Admitting: *Deleted

## 2020-11-07 ENCOUNTER — Ambulatory Visit: Payer: Medicare PPO | Admitting: Family Medicine

## 2020-11-07 ENCOUNTER — Inpatient Hospital Stay (HOSPITAL_COMMUNITY)
Admission: EM | Admit: 2020-11-07 | Discharge: 2020-11-17 | DRG: 252 | Disposition: A | Discharge status: Home Health | Payer: Medicare PPO | Attending: Family Medicine | Admitting: Family Medicine

## 2020-11-07 ENCOUNTER — Inpatient Hospital Stay (HOSPITAL_COMMUNITY): Payer: Medicare PPO

## 2020-11-07 ENCOUNTER — Emergency Department (HOSPITAL_COMMUNITY): Payer: Medicare PPO

## 2020-11-07 DIAGNOSIS — E1122 Type 2 diabetes mellitus with diabetic chronic kidney disease: Secondary | ICD-10-CM | POA: Diagnosis present

## 2020-11-07 DIAGNOSIS — F17210 Nicotine dependence, cigarettes, uncomplicated: Secondary | ICD-10-CM | POA: Diagnosis present

## 2020-11-07 DIAGNOSIS — E1169 Type 2 diabetes mellitus with other specified complication: Secondary | ICD-10-CM | POA: Diagnosis not present

## 2020-11-07 DIAGNOSIS — E11628 Type 2 diabetes mellitus with other skin complications: Secondary | ICD-10-CM | POA: Diagnosis not present

## 2020-11-07 DIAGNOSIS — Z794 Long term (current) use of insulin: Secondary | ICD-10-CM

## 2020-11-07 DIAGNOSIS — J449 Chronic obstructive pulmonary disease, unspecified: Secondary | ICD-10-CM | POA: Diagnosis present

## 2020-11-07 DIAGNOSIS — S91101A Unspecified open wound of right great toe without damage to nail, initial encounter: Secondary | ICD-10-CM | POA: Diagnosis not present

## 2020-11-07 DIAGNOSIS — B871 Wound myiasis: Secondary | ICD-10-CM | POA: Diagnosis present

## 2020-11-07 DIAGNOSIS — M86271 Subacute osteomyelitis, right ankle and foot: Secondary | ICD-10-CM | POA: Diagnosis not present

## 2020-11-07 DIAGNOSIS — N170 Acute kidney failure with tubular necrosis: Secondary | ICD-10-CM | POA: Diagnosis not present

## 2020-11-07 DIAGNOSIS — D638 Anemia in other chronic diseases classified elsewhere: Secondary | ICD-10-CM | POA: Diagnosis present

## 2020-11-07 DIAGNOSIS — N1831 Chronic kidney disease, stage 3a: Secondary | ICD-10-CM | POA: Diagnosis present

## 2020-11-07 DIAGNOSIS — I480 Paroxysmal atrial fibrillation: Secondary | ICD-10-CM | POA: Diagnosis not present

## 2020-11-07 DIAGNOSIS — L97519 Non-pressure chronic ulcer of other part of right foot with unspecified severity: Secondary | ICD-10-CM | POA: Diagnosis present

## 2020-11-07 DIAGNOSIS — E1142 Type 2 diabetes mellitus with diabetic polyneuropathy: Secondary | ICD-10-CM | POA: Diagnosis present

## 2020-11-07 DIAGNOSIS — E7800 Pure hypercholesterolemia, unspecified: Secondary | ICD-10-CM | POA: Diagnosis present

## 2020-11-07 DIAGNOSIS — L0390 Cellulitis, unspecified: Secondary | ICD-10-CM | POA: Diagnosis not present

## 2020-11-07 DIAGNOSIS — I130 Hypertensive heart and chronic kidney disease with heart failure and stage 1 through stage 4 chronic kidney disease, or unspecified chronic kidney disease: Secondary | ICD-10-CM | POA: Diagnosis present

## 2020-11-07 DIAGNOSIS — I2510 Atherosclerotic heart disease of native coronary artery without angina pectoris: Secondary | ICD-10-CM | POA: Diagnosis present

## 2020-11-07 DIAGNOSIS — I10 Essential (primary) hypertension: Secondary | ICD-10-CM | POA: Diagnosis present

## 2020-11-07 DIAGNOSIS — Z95820 Peripheral vascular angioplasty status with implants and grafts: Secondary | ICD-10-CM | POA: Diagnosis not present

## 2020-11-07 DIAGNOSIS — I739 Peripheral vascular disease, unspecified: Secondary | ICD-10-CM | POA: Diagnosis present

## 2020-11-07 DIAGNOSIS — E1149 Type 2 diabetes mellitus with other diabetic neurological complication: Secondary | ICD-10-CM | POA: Diagnosis present

## 2020-11-07 DIAGNOSIS — Z89512 Acquired absence of left leg below knee: Secondary | ICD-10-CM

## 2020-11-07 DIAGNOSIS — I96 Gangrene, not elsewhere classified: Secondary | ICD-10-CM | POA: Diagnosis not present

## 2020-11-07 DIAGNOSIS — R0602 Shortness of breath: Secondary | ICD-10-CM

## 2020-11-07 DIAGNOSIS — I5020 Unspecified systolic (congestive) heart failure: Secondary | ICD-10-CM | POA: Diagnosis not present

## 2020-11-07 DIAGNOSIS — I5022 Chronic systolic (congestive) heart failure: Secondary | ICD-10-CM | POA: Diagnosis not present

## 2020-11-07 DIAGNOSIS — D631 Anemia in chronic kidney disease: Secondary | ICD-10-CM | POA: Diagnosis present

## 2020-11-07 DIAGNOSIS — Z6831 Body mass index (BMI) 31.0-31.9, adult: Secondary | ICD-10-CM

## 2020-11-07 DIAGNOSIS — N183 Chronic kidney disease, stage 3 unspecified: Secondary | ICD-10-CM | POA: Diagnosis not present

## 2020-11-07 DIAGNOSIS — M869 Osteomyelitis, unspecified: Secondary | ICD-10-CM | POA: Diagnosis not present

## 2020-11-07 DIAGNOSIS — E1152 Type 2 diabetes mellitus with diabetic peripheral angiopathy with gangrene: Secondary | ICD-10-CM | POA: Diagnosis not present

## 2020-11-07 DIAGNOSIS — Z86718 Personal history of other venous thrombosis and embolism: Secondary | ICD-10-CM

## 2020-11-07 DIAGNOSIS — L98499 Non-pressure chronic ulcer of skin of other sites with unspecified severity: Secondary | ICD-10-CM | POA: Diagnosis not present

## 2020-11-07 DIAGNOSIS — L089 Local infection of the skin and subcutaneous tissue, unspecified: Secondary | ICD-10-CM | POA: Diagnosis not present

## 2020-11-07 DIAGNOSIS — M7989 Other specified soft tissue disorders: Secondary | ICD-10-CM | POA: Diagnosis not present

## 2020-11-07 DIAGNOSIS — Z20822 Contact with and (suspected) exposure to covid-19: Secondary | ICD-10-CM | POA: Diagnosis not present

## 2020-11-07 DIAGNOSIS — E669 Obesity, unspecified: Secondary | ICD-10-CM | POA: Diagnosis present

## 2020-11-07 DIAGNOSIS — R0600 Dyspnea, unspecified: Secondary | ICD-10-CM | POA: Diagnosis not present

## 2020-11-07 DIAGNOSIS — R791 Abnormal coagulation profile: Secondary | ICD-10-CM | POA: Diagnosis present

## 2020-11-07 DIAGNOSIS — Z7984 Long term (current) use of oral hypoglycemic drugs: Secondary | ICD-10-CM

## 2020-11-07 DIAGNOSIS — D62 Acute posthemorrhagic anemia: Secondary | ICD-10-CM | POA: Diagnosis not present

## 2020-11-07 DIAGNOSIS — I70261 Atherosclerosis of native arteries of extremities with gangrene, right leg: Secondary | ICD-10-CM | POA: Diagnosis present

## 2020-11-07 DIAGNOSIS — I350 Nonrheumatic aortic (valve) stenosis: Secondary | ICD-10-CM | POA: Diagnosis present

## 2020-11-07 DIAGNOSIS — I70221 Atherosclerosis of native arteries of extremities with rest pain, right leg: Secondary | ICD-10-CM | POA: Diagnosis not present

## 2020-11-07 DIAGNOSIS — E11621 Type 2 diabetes mellitus with foot ulcer: Secondary | ICD-10-CM | POA: Diagnosis present

## 2020-11-07 DIAGNOSIS — S91301A Unspecified open wound, right foot, initial encounter: Secondary | ICD-10-CM | POA: Diagnosis not present

## 2020-11-07 DIAGNOSIS — Z79899 Other long term (current) drug therapy: Secondary | ICD-10-CM

## 2020-11-07 DIAGNOSIS — G629 Polyneuropathy, unspecified: Secondary | ICD-10-CM

## 2020-11-07 DIAGNOSIS — Z811 Family history of alcohol abuse and dependence: Secondary | ICD-10-CM

## 2020-11-07 DIAGNOSIS — I5043 Acute on chronic combined systolic (congestive) and diastolic (congestive) heart failure: Secondary | ICD-10-CM | POA: Diagnosis not present

## 2020-11-07 DIAGNOSIS — I517 Cardiomegaly: Secondary | ICD-10-CM | POA: Diagnosis not present

## 2020-11-07 DIAGNOSIS — R600 Localized edema: Secondary | ICD-10-CM | POA: Diagnosis not present

## 2020-11-07 DIAGNOSIS — E861 Hypovolemia: Secondary | ICD-10-CM | POA: Diagnosis not present

## 2020-11-07 DIAGNOSIS — Z7901 Long term (current) use of anticoagulants: Secondary | ICD-10-CM

## 2020-11-07 LAB — CBC WITH DIFFERENTIAL/PLATELET
Abs Immature Granulocytes: 0.03 10*3/uL (ref 0.00–0.07)
Basophils Absolute: 0.1 10*3/uL (ref 0.0–0.1)
Basophils Relative: 1 %
Eosinophils Absolute: 0.2 10*3/uL (ref 0.0–0.5)
Eosinophils Relative: 3 %
HCT: 34.4 % — ABNORMAL LOW (ref 39.0–52.0)
Hemoglobin: 11.1 g/dL — ABNORMAL LOW (ref 13.0–17.0)
Immature Granulocytes: 0 %
Lymphocytes Relative: 22 %
Lymphs Abs: 2.1 10*3/uL (ref 0.7–4.0)
MCH: 29.8 pg (ref 26.0–34.0)
MCHC: 32.3 g/dL (ref 30.0–36.0)
MCV: 92.2 fL (ref 80.0–100.0)
Monocytes Absolute: 0.8 10*3/uL (ref 0.1–1.0)
Monocytes Relative: 8 %
Neutro Abs: 6.2 10*3/uL (ref 1.7–7.7)
Neutrophils Relative %: 66 %
Platelets: 326 10*3/uL (ref 150–400)
RBC: 3.73 MIL/uL — ABNORMAL LOW (ref 4.22–5.81)
RDW: 14.3 % (ref 11.5–15.5)
WBC: 9.4 10*3/uL (ref 4.0–10.5)
nRBC: 0 % (ref 0.0–0.2)

## 2020-11-07 LAB — PROTIME-INR
INR: 1.9 — ABNORMAL HIGH (ref 0.8–1.2)
Prothrombin Time: 21.4 seconds — ABNORMAL HIGH (ref 11.4–15.2)

## 2020-11-07 LAB — COMPREHENSIVE METABOLIC PANEL
ALT: 13 U/L (ref 0–44)
AST: 13 U/L — ABNORMAL LOW (ref 15–41)
Albumin: 3.5 g/dL (ref 3.5–5.0)
Alkaline Phosphatase: 70 U/L (ref 38–126)
Anion gap: 6 (ref 5–15)
BUN: 25 mg/dL — ABNORMAL HIGH (ref 8–23)
CO2: 26 mmol/L (ref 22–32)
Calcium: 8.9 mg/dL (ref 8.9–10.3)
Chloride: 103 mmol/L (ref 98–111)
Creatinine, Ser: 1.11 mg/dL (ref 0.61–1.24)
GFR, Estimated: >60 mL/min (ref >60)
Glucose, Bld: 109 mg/dL — ABNORMAL HIGH (ref 70–99)
Potassium: 4.1 mmol/L (ref 3.5–5.1)
Sodium: 135 mmol/L (ref 135–145)
Total Bilirubin: 0.6 mg/dL (ref 0.3–1.2)
Total Protein: 7.3 g/dL (ref 6.5–8.1)

## 2020-11-07 LAB — LACTIC ACID, PLASMA: Lactic Acid, Venous: 0.8 mmol/L (ref 0.5–1.9)

## 2020-11-07 LAB — GLUCOSE, CAPILLARY: Glucose-Capillary: 132 mg/dL — ABNORMAL HIGH (ref 70–99)

## 2020-11-07 IMAGING — MR MR FOOT*R* W/O CM
5 series · 40 of 40 positions shown · non-contrast
Comparison: Radiographs [DATE].  MRI [DATE].

CLINICAL DATA: Diabetic foot ulcer.  Concern for osteomyelitis.

EXAM:
MRI OF THE RIGHT FOREFOOT WITHOUT CONTRAST
TECHNIQUE: Multiplanar, multisequence MR imaging of the right forefoot was
performed. No intravenous contrast was administered.

[Series 4: T1 · coronal · right · 3.0mm · 0.47mm/px · 11 of 42 slices shown (1 of 2)]
[im 1/42]
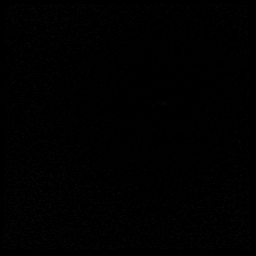
[im 5/42]
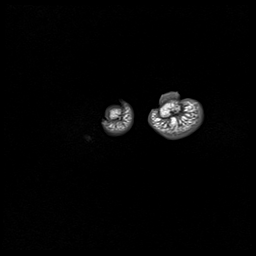
[im 9/42]
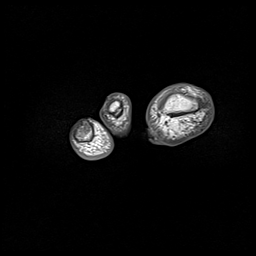
[im 13/42]
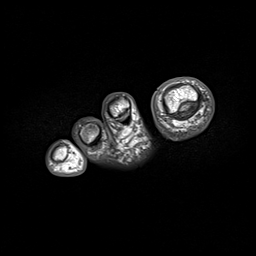
[im 17/42]
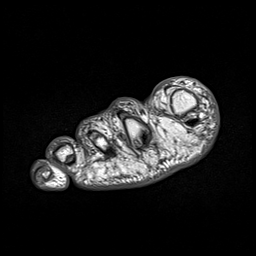
[im 21/42]
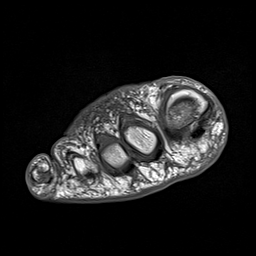
[im 25/42]
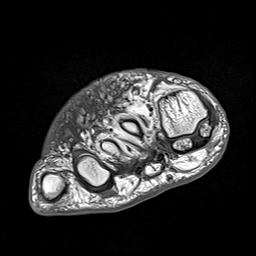
[im 29/42]
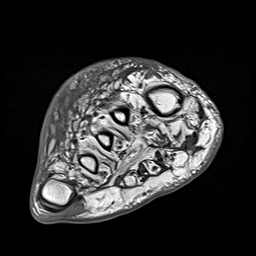
[im 33/42]
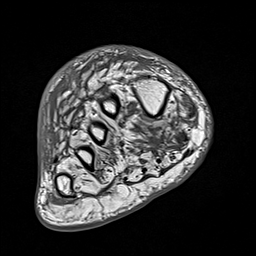
[im 37/42]
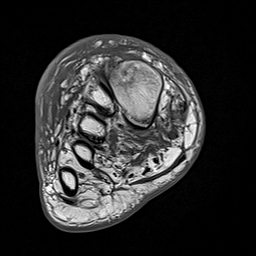
[im 42/42]
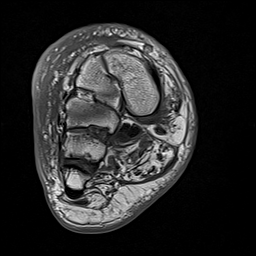

[Series 5: T2 fat-sat · coronal · right · 3.0mm · 0.38mm/px · 10 of 42 slices shown (1 of 2)]
[im 1/42]
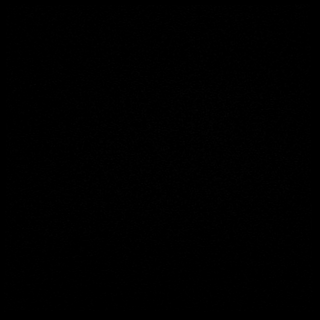
[im 5/42]
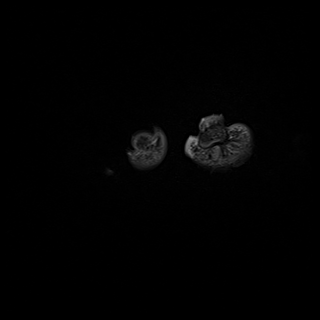
[im 10/42]
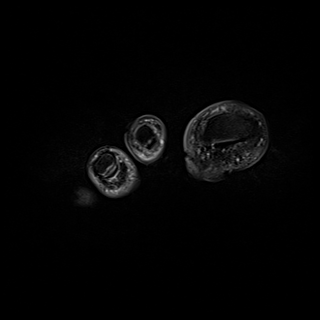
[im 14/42]
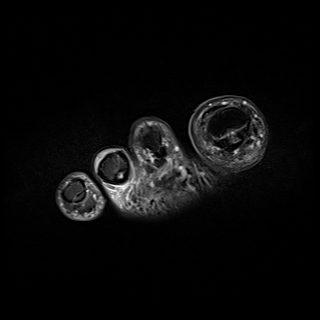
[im 19/42]
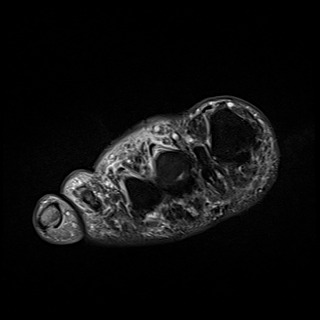
[im 23/42]
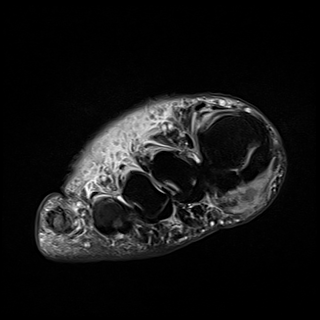
[im 28/42]
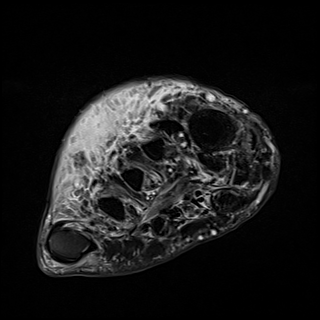
[im 32/42]
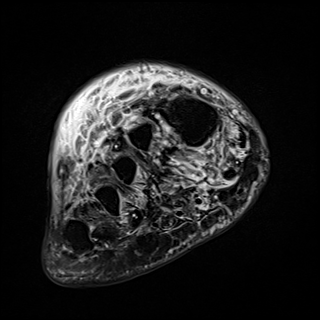
[im 37/42]
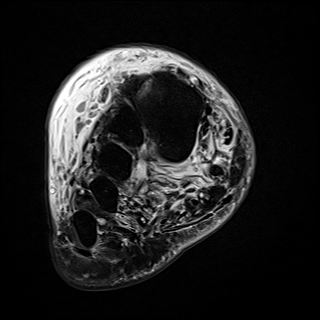
[im 42/42]
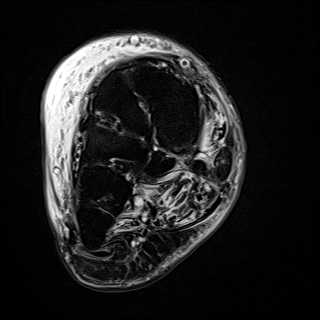

[Series 6: T2 fat-sat · axial · right · 3.0mm · 0.70mm/px · z∈[-93,-14]mm · 6 of 26 slices shown (2 of 2)]
[im 1/26]
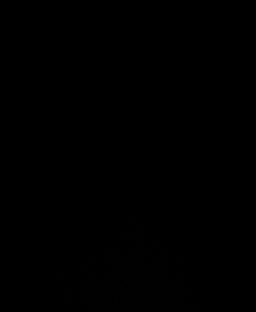
[im 6/26]
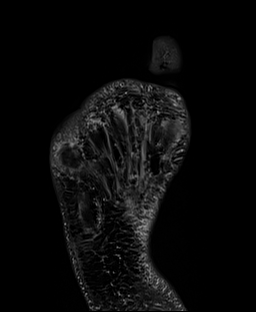
[im 11/26]
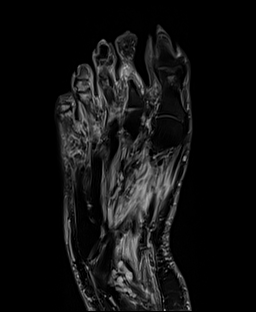
[im 16/26]
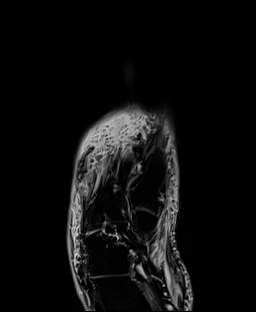
[im 21/26]
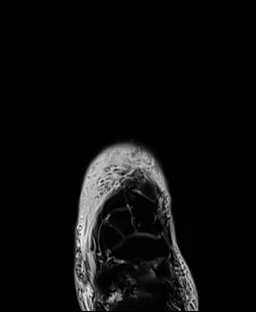
[im 26/26]
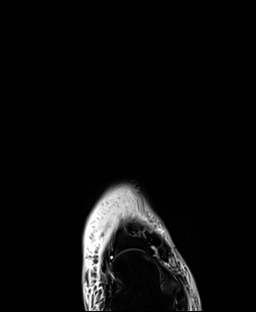

[Series 7: T1 · axial · right · 3.0mm · 0.70mm/px · z∈[-93,-14]mm · 6 of 26 slices shown (2 of 2)]
[im 1/26]
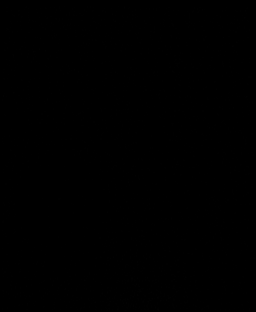
[im 6/26]
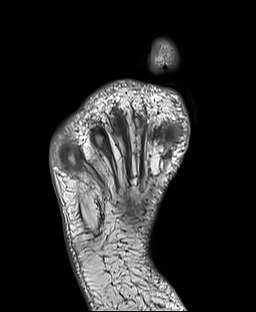
[im 11/26]
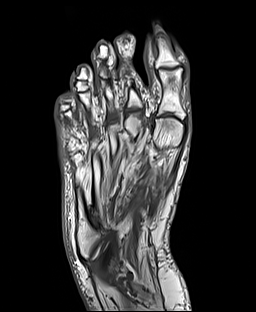
[im 16/26]
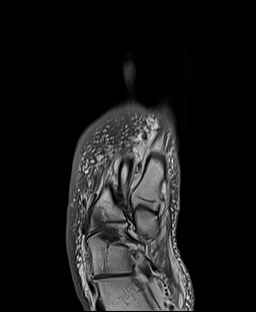
[im 21/26]
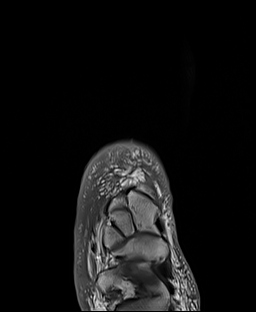
[im 26/26]
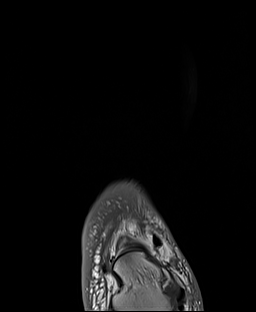

[Series 8: STIR · sagittal · right · 3.0mm · 0.35mm/px · 7 of 30 slices shown]
[im 1/30]
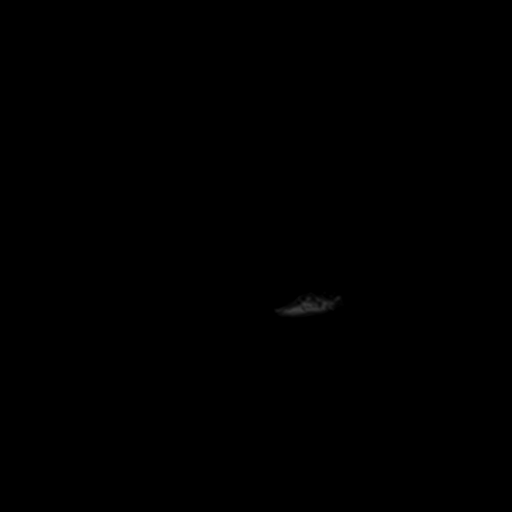
[im 5/30]
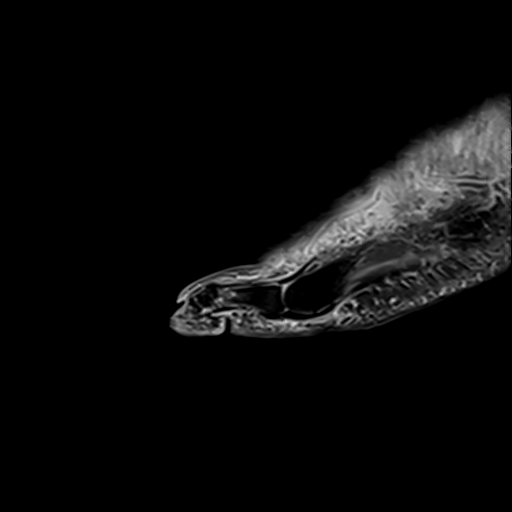
[im 10/30]
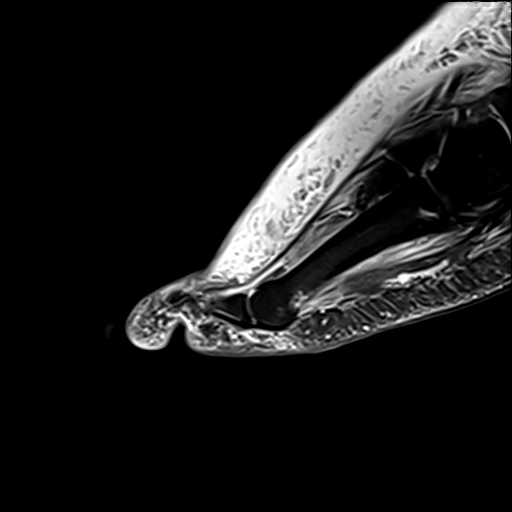
[im 15/30]
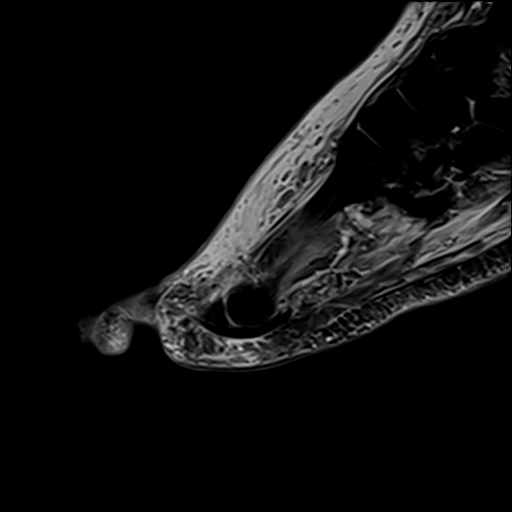
[im 20/30]
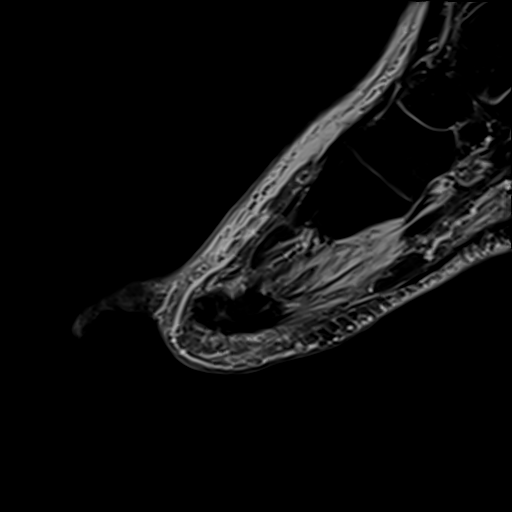
[im 25/30]
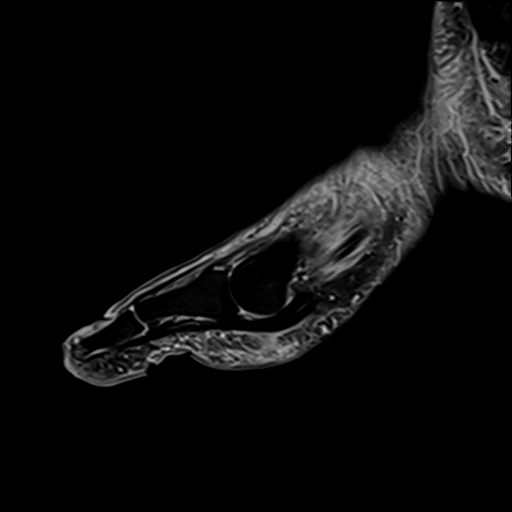
[im 30/30]
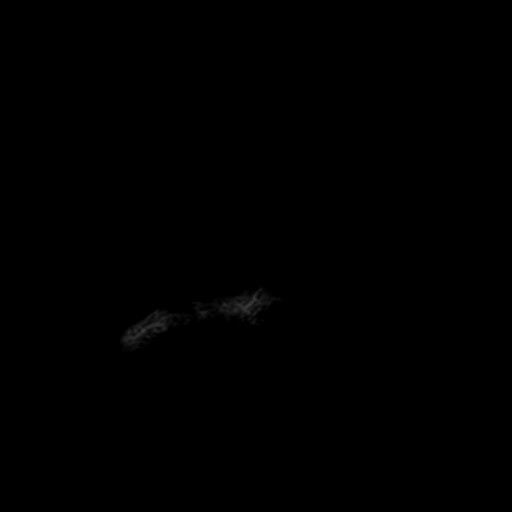

[40 of 40 positions shown; findings below may reference images not displayed]

FINDINGS: Bones/Joint/Cartilage

There is no evidence bone marrow or cortical destruction. No erosive
changes or significant joint effusions. Mild degenerative changes at
1st metatarsophalangeal joint. The alignment is normal at the
Lisfranc joint.

Ligaments

The Lisfranc ligament is intact. The collateral ligaments of the
metatarsophalangeal joints are intact.

Muscles and Tendons

Mild diffuse muscular T2 hyperintensity attributed to diabetic
myopathy. No focal fluid collection, tendon tear or significant
tenosynovitis.

Soft tissues

There is mild soft tissue ulceration along the plantar and lateral
aspect of the distal great toe. No drainable fluid collection is
apparent in this area. No evidence of adjacent osteomyelitis. Dorsal
subcutaneous edema has improved compared with prior study.
IMPRESSION: 1. Focal soft tissue ulceration in the plantar and lateral aspect of
the great toe without evidence of underlying focal abscess.
2. Improved nonspecific dorsal subcutaneous edema.
3. No evidence of osteomyelitis or septic joint.

## 2020-11-07 IMAGING — DX DG FOOT COMPLETE 3+V*R*
3 series · 3 of 3 positions shown · non-contrast
Comparison: MR foot, [DATE]

CLINICAL DATA: Great toe infection, concern for osteomyelitis

EXAM:
RIGHT FOOT COMPLETE - 3+ VIEW

[foot ap]
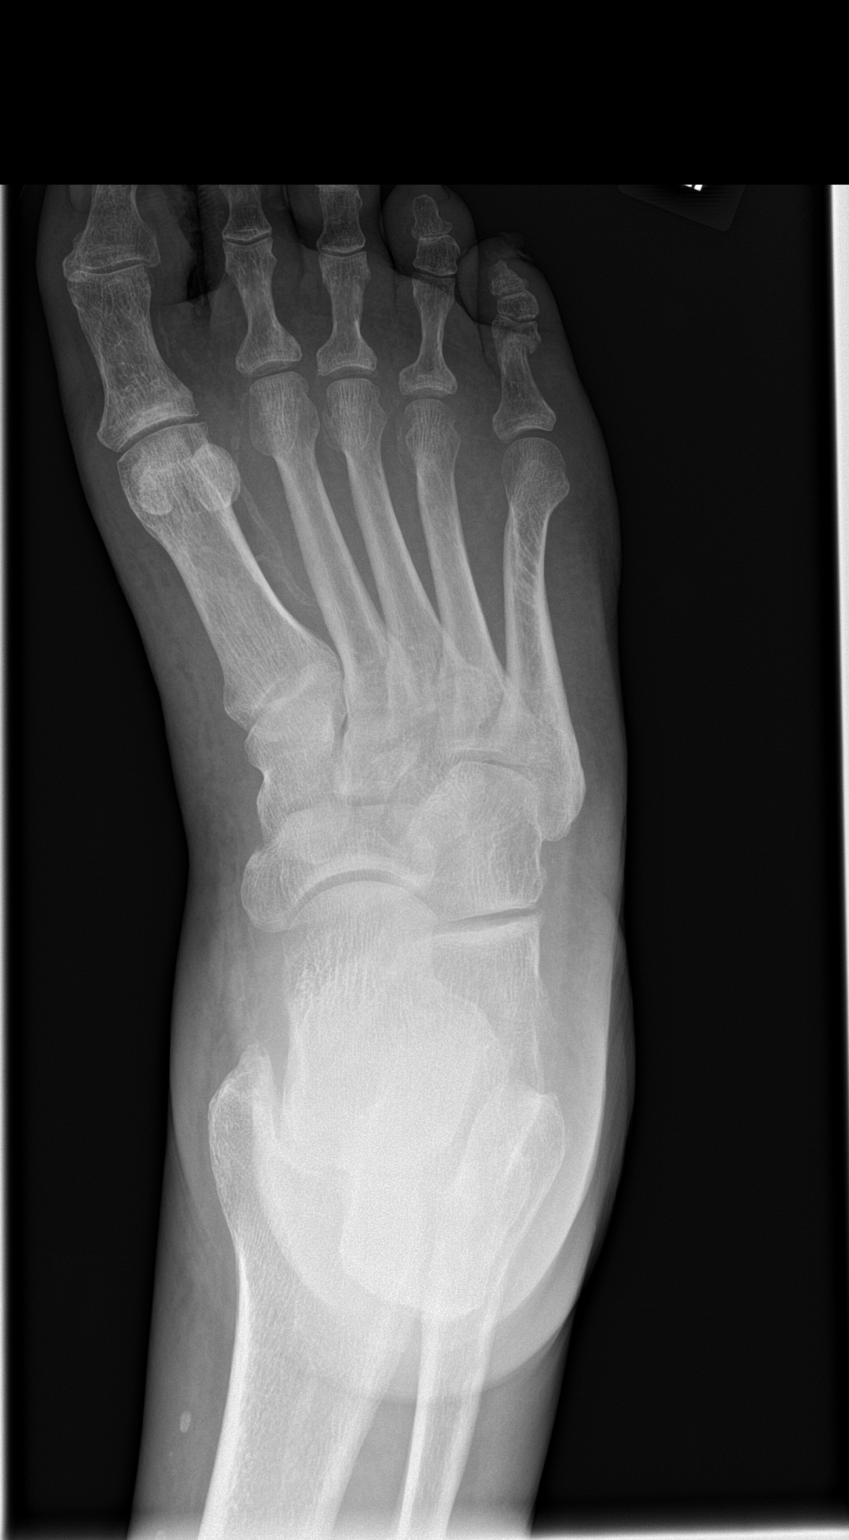

[foot obl]
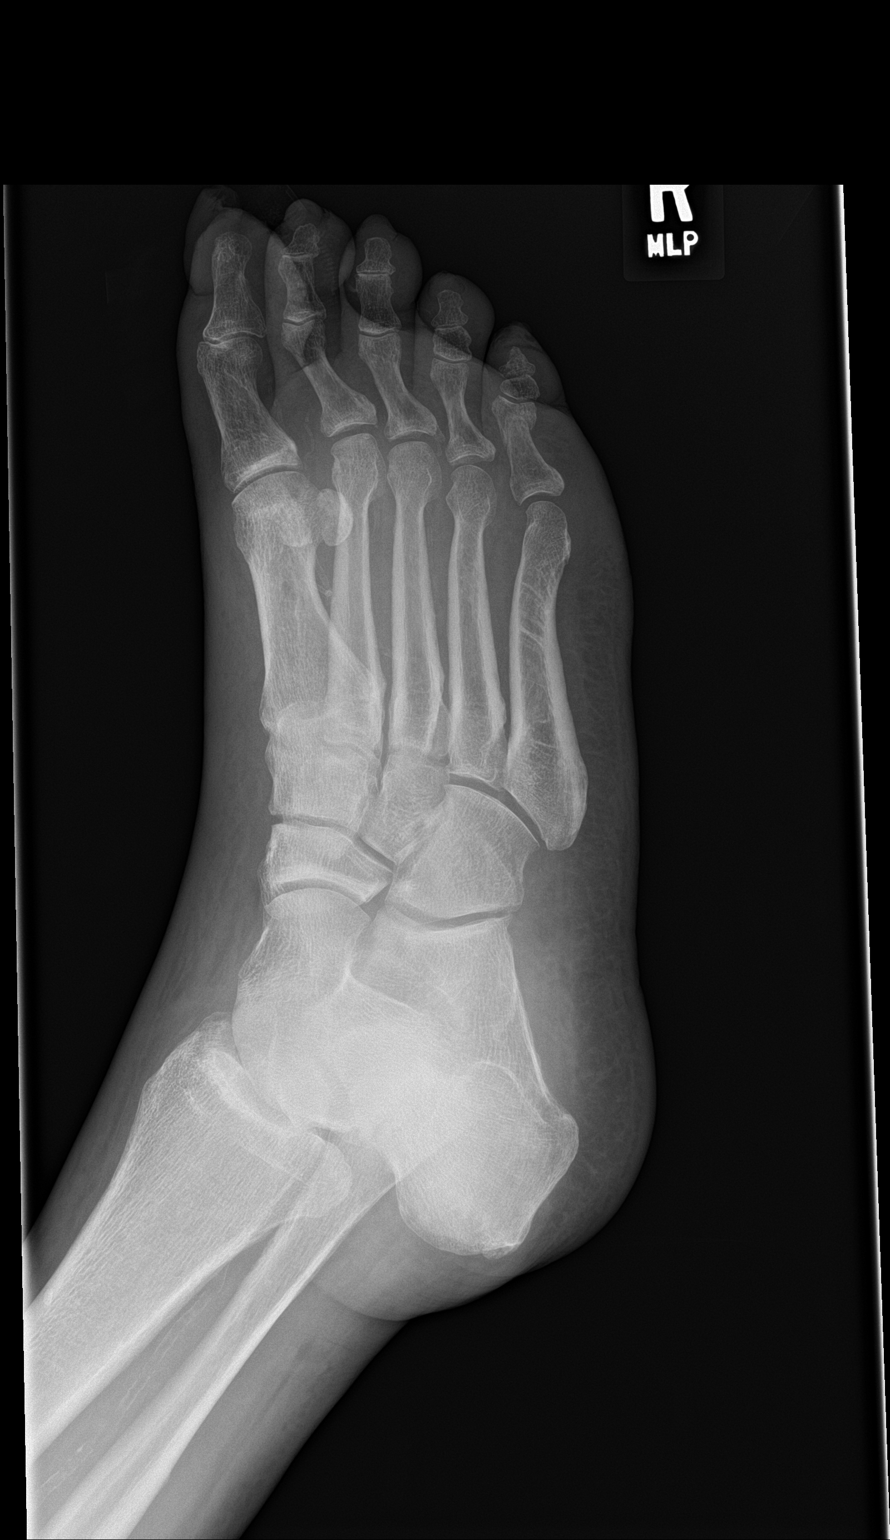

[foot lat]
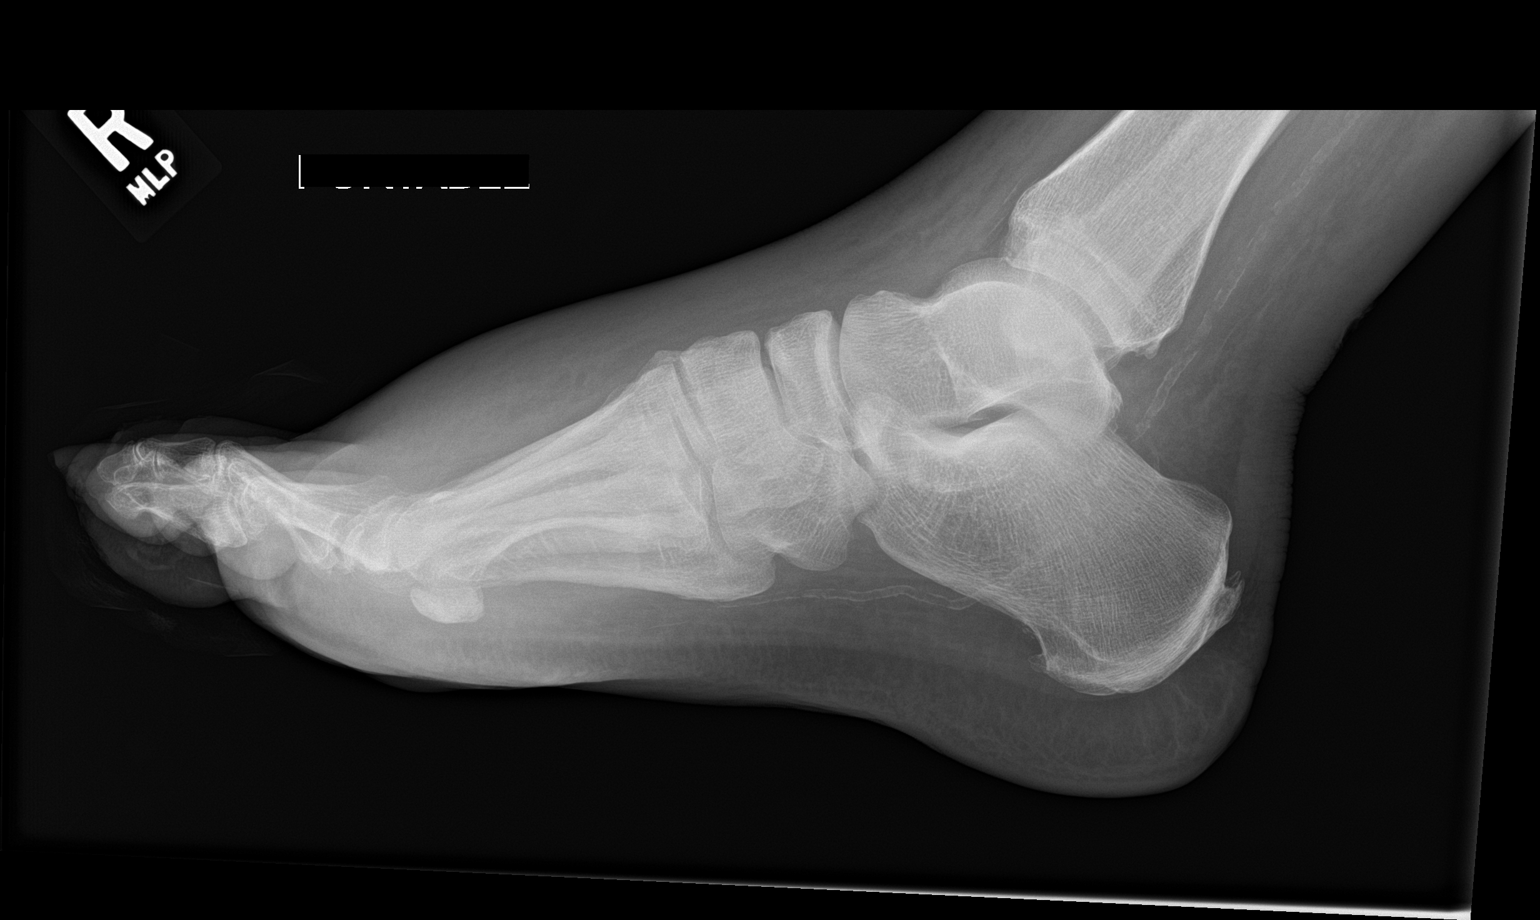

[3 of 3 positions shown; findings below may reference images not displayed]

FINDINGS: No fracture or dislocation of the right foot. There is subtle bony
lucency of the lateral aspect of the tuft of the left great toe
distal phalanx without overt cortical disruption. Overlying soft
tissue ulceration. Diffuse soft tissue edema about the forefoot.
IMPRESSION: 1. There is subtle bony lucency of the lateral aspect of the tuft of
the left great toe distal phalanx without overt cortical disruption.
Overlying soft tissue ulceration. Findings are suspicious for
osteomyelitis.

2.  Diffuse soft tissue edema about the forefoot.

## 2020-11-07 MED ORDER — LOSARTAN POTASSIUM 25 MG PO TABS
12.5000 mg | ORAL_TABLET | Freq: Every day | ORAL | Status: DC
  Administered 2020-11-08 – 2020-11-13 (×6): 12.5 mg via ORAL
  Filled 2020-11-07 (×3): qty 1
  Filled 2020-11-07 (×2): qty 0.5
  Filled 2020-11-07: qty 1

## 2020-11-07 MED ORDER — PIPERACILLIN-TAZOBACTAM 3.375 G IVPB 30 MIN
3.3750 g | Freq: Once | INTRAVENOUS | Status: AC
  Administered 2020-11-07: 3.375 g via INTRAVENOUS
  Filled 2020-11-07: qty 50

## 2020-11-07 MED ORDER — INSULIN ASPART PROT & ASPART (70-30 MIX) 100 UNIT/ML ~~LOC~~ SUSP
15.0000 [IU] | Freq: Every day | SUBCUTANEOUS | Status: DC
  Administered 2020-11-08 – 2020-11-16 (×6): 15 [IU] via SUBCUTANEOUS

## 2020-11-07 MED ORDER — VANCOMYCIN HCL 2000 MG/400ML IV SOLN
2000.0000 mg | Freq: Once | INTRAVENOUS | Status: AC
  Administered 2020-11-07: 2000 mg via INTRAVENOUS
  Filled 2020-11-07 (×2): qty 400

## 2020-11-07 MED ORDER — VANCOMYCIN HCL 1500 MG/300ML IV SOLN
1500.0000 mg | INTRAVENOUS | Status: DC
  Administered 2020-11-08 – 2020-11-11 (×4): 1500 mg via INTRAVENOUS
  Filled 2020-11-07 (×4): qty 300

## 2020-11-07 MED ORDER — VANCOMYCIN HCL IN DEXTROSE 1-5 GM/200ML-% IV SOLN: 1000.0000 mg | Freq: Once | INTRAVENOUS | Status: DC

## 2020-11-07 MED ORDER — ATORVASTATIN CALCIUM 80 MG PO TABS
80.0000 mg | ORAL_TABLET | Freq: Every day | ORAL | Status: DC
  Administered 2020-11-08 – 2020-11-17 (×9): 80 mg via ORAL
  Filled 2020-11-07 (×2): qty 2
  Filled 2020-11-07 (×5): qty 1
  Filled 2020-11-07 (×2): qty 2
  Filled 2020-11-07 (×3): qty 1

## 2020-11-07 MED ORDER — CARVEDILOL 6.25 MG PO TABS
6.2500 mg | ORAL_TABLET | Freq: Two times a day (BID) | ORAL | Status: DC
  Administered 2020-11-07 – 2020-11-17 (×19): 6.25 mg via ORAL
  Filled 2020-11-07 (×19): qty 1

## 2020-11-07 MED ORDER — ACETAMINOPHEN 325 MG PO TABS
650.0000 mg | ORAL_TABLET | Freq: Four times a day (QID) | ORAL | Status: DC | PRN
  Administered 2020-11-07 – 2020-11-14 (×7): 650 mg via ORAL
  Filled 2020-11-07 (×7): qty 2

## 2020-11-07 MED ORDER — WARFARIN - PHARMACIST DOSING INPATIENT: Freq: Every day | Status: DC

## 2020-11-07 MED ORDER — AMIODARONE HCL 200 MG PO TABS
200.0000 mg | ORAL_TABLET | Freq: Every day | ORAL | Status: DC
  Administered 2020-11-08 – 2020-11-17 (×10): 200 mg via ORAL
  Filled 2020-11-07 (×10): qty 1

## 2020-11-07 MED ORDER — WARFARIN SODIUM 5 MG PO TABS
5.0000 mg | ORAL_TABLET | Freq: Once | ORAL | Status: AC
  Administered 2020-11-07: 5 mg via ORAL
  Filled 2020-11-07: qty 1

## 2020-11-07 MED ORDER — SODIUM CHLORIDE 0.9 % IV SOLN
2.0000 g | Freq: Three times a day (TID) | INTRAVENOUS | Status: AC
  Administered 2020-11-07 – 2020-11-16 (×26): 2 g via INTRAVENOUS
  Filled 2020-11-07 (×29): qty 2

## 2020-11-07 MED ORDER — INSULIN ASPART PROT & ASPART (70-30 MIX) 100 UNIT/ML ~~LOC~~ SUSP
30.0000 [IU] | Freq: Every day | SUBCUTANEOUS | Status: DC
  Administered 2020-11-08 – 2020-11-17 (×9): 30 [IU] via SUBCUTANEOUS
  Filled 2020-11-07 (×2): qty 10

## 2020-11-07 MED ORDER — ACETAMINOPHEN 650 MG RE SUPP: 650.0000 mg | Freq: Four times a day (QID) | RECTAL | Status: DC | PRN

## 2020-11-07 MED ORDER — INSULIN ASPART 100 UNIT/ML IJ SOLN
0.0000 [IU] | Freq: Three times a day (TID) | INTRAMUSCULAR | Status: DC
  Administered 2020-11-08 (×3): 1 [IU] via SUBCUTANEOUS
  Administered 2020-11-09: 2 [IU] via SUBCUTANEOUS
  Administered 2020-11-10 – 2020-11-12 (×4): 1 [IU] via SUBCUTANEOUS
  Filled 2020-11-07: qty 0.09

## 2020-11-07 NOTE — ED Provider Notes (Signed)
Emergency Medicine Provider Triage Evaluation Note  Rodney [name] , a 70 y.o. male  was evaluated in triage.  Pt complains of infection to right great toe.  Patient states that he noticed blood on his toe when his girlfriend went to clean it she noticed maggots in his wound.  Patient says he has known of this blister over the last 2 months.  Patient has a history of neuropathy and denies any pain or change in numbness to his affected foot.  Has any fevers, chills, nausea or vomiting.  Review of Systems  Positive: Wound, numbness Negative: Fever, chills, nausea, vomiting  Physical Exam  BP (!) 145/71 (BP Location: Left Arm)   Pulse 77   Temp 98 F (36.7 C) (Oral)   Resp 18   SpO2 95%  Gen:   Awake, no distress   Resp:  Normal effort  MSK:   Moves extremities without difficulty, right great toe has area of necrosis to lateral aspect, foul-smelling, live maggots noted in wound. Other:    Medical Decision Making  Medically screening exam initiated at 1:00 PM.  Appropriate orders placed.  [name] was informed that the remainder of the evaluation will be completed by another provider, this initial triage assessment does not replace that evaluation, and the importance of remaining in the ED until their evaluation is complete.  The patient appears stable so that the remainder of the work up may be completed by another provider.      Dyann Ruddle 11/07/20 1302    Blanchie Dessert, MD 11/10/20 2204

## 2020-11-07 NOTE — H&P (Signed)
History and Physical    Rodney [name] WJX:[id number] DOB: [birth year] DOA: 11/07/2020  PCP: [name], MD  Patient coming from: Home.  Chief Complaint: Right great toe wound growing maggots.  HPI: Rodney [name] is a 70 y.o. male with history of peripheral vascular disease status post femoropopliteal bypass in April 2022 following which patient also was admitted the same month for cellulitis of the right lower extremity treated with antibiotics with history of CAD, diabetes mellitus, chronic kidney disease, hypertension and A. fib on Coumadin presents to the ER after patient's family noted that patient's right great toe had a wound from which maggots were going.  Denies any trauma fall or pain.  ED Course: In the ER x-rays revealed right great toe osteomyelitis changes.  Labs show hemoglobin of 11.1 creatinine 1.1 COVID test pending.  Given the osteomyelitic changes with wound on the right great toe with history of peripheral vascular disease patient started on antibiotics and admitted for further observation and management.  Patient's right dorsalis artery is dopplerable.  Review of Systems: As per HPI, rest all negative.   Past Medical History:  Diagnosis Date   CHF (congestive heart failure) (HCC)    Coronary artery disease    Diabetes mellitus without complication ([facility])    HLD (hyperlipidemia)    Hypertension    Peripheral edema 02/24/2018   SOB (shortness of breath) 07/26/2018    Past Surgical History:  Procedure Laterality Date   ABDOMINAL AORTOGRAM W/LOWER EXTREMITY N/A 08/05/2020   Procedure: ABDOMINAL AORTOGRAM W/LOWER EXTREMITY;  Surgeon: Marty Heck, MD;  Location: [facility] [place] [facility];  Service: Cardiovascular;  Laterality: N/A;   AMPUTATION Left 09/28/2019   Procedure: AMPUTATION BELOW KNEE;  Surgeon: Newt Minion, MD;  Location: [place];  Service: Orthopedics;  Laterality: Left;   CARDIAC CATHETERIZATION     CARDIOVERSION N/A 10/05/2019   Procedure:  CARDIOVERSION;  Surgeon: Sanda Klein, MD;  Location: [facility];  Service: Cardiovascular;  Laterality: N/A;   FEMORAL-POPLITEAL BYPASS GRAFT Right 08/07/2020   Procedure: RIGHT FEMORAL TO BELOW KNEE POPLITEAL ARTERY BYPASS;  Surgeon: Waynetta Sandy, MD;  Location: [facility];  Service: Vascular;  Laterality: Right;   LEFT HEART CATH AND CORONARY ANGIOGRAPHY N/A 10/03/2019   Procedure: LEFT HEART CATH AND CORONARY ANGIOGRAPHY;  Surgeon: Lorretta Harp, MD;  Location: [place] [facility];  Service: Cardiovascular;  Laterality: N/A;   PERIPHERAL VASCULAR INTERVENTION Right 08/05/2020   Procedure: PERIPHERAL VASCULAR INTERVENTION;  Surgeon: Marty Heck, MD;  Location: [facility] [facility];  Service: Cardiovascular;  Laterality: Right;  common Iliac   TEE WITHOUT CARDIOVERSION N/A 10/05/2019   Procedure: TRANSESOPHAGEAL ECHOCARDIOGRAM (TEE);  Surgeon: Sanda Klein, MD;  Location: [facility];  Service: Cardiovascular;  Laterality: N/A;     reports that he has been smoking cigarettes. He has a 25.00 pack-year smoking history. He has never used smokeless tobacco. He reports current alcohol use of about 6.0 standard drinks of alcohol per week. He reports that he does not use drugs.  No Known Allergies  Family History  Problem Relation Age of Onset   Alcoholism Mother    Alcoholism Father     Prior to Admission medications   Medication Sig Start Date End Date Taking? Authorizing Provider  acetaminophen (TYLENOL) 500 MG tablet Take 1,000 mg by mouth every 6 (six) hours as needed for moderate pain.   Yes [provider]  amiodarone (PACERONE) 200 MG tablet Take 1 tablet (200 mg total)  by mouth daily. 10/14/20  Yes Hensel, Jamal Collin, MD  atorvastatin (LIPITOR) 80 MG tablet TAKE 1 TABLET EVERY DAY Patient taking differently: Take 80 mg by mouth daily. 03/25/20  Yes O'Neal, Cassie Freer, MD  carvedilol (COREG) 12.5 MG tablet Take 0.5 tablets (6.25 mg total) by mouth 2 (two)  times daily with a meal. 10/14/20  Yes Hensel, Jamal Collin, MD  furosemide (LASIX) 40 MG tablet Take 1 tablet (40 mg total) by mouth daily as needed for fluid (swelling). 10/14/20  Yes Hensel, Jamal Collin, MD  insulin NPH-regular Human (NOVOLIN 70/30 RELION) (70-30) 100 UNIT/ML injection Inject 15-30 Units into the skin See admin instructions. Inject 30 units into the skin with breakfast and 15 units with supper   Yes [provider]  losartan (COZAAR) 25 MG tablet Take 1/2 tablet (12.5 mg total) by mouth daily. 08/20/20  Yes Welborn, Ryan, DO  metFORMIN (GLUCOPHAGE) 1000 MG tablet Take 1 tablet (1,000 mg total) by mouth daily. 09/18/20  Yes Hensel, Jamal Collin, MD  warfarin (COUMADIN) 5 MG tablet Take 1 tablet (5 mg total) by mouth daily. 08/19/20  Yes Welborn, Ryan, DO  Blood Glucose Monitoring Suppl (TRUE METRIX METER) DEVI Use to test blood sugar three times daily. 11/20/19   [name], MD  Blood Glucose Monitoring Suppl (TRUE METRIX METER) w/Device KIT USE AS DIRECTED 03/25/20   Hensel, Jamal Collin, MD  glucose blood (RELION TRUE METRIX TEST STRIPS) test strip Use to test blood sugar three times per day. 11/20/19   [name], MD  multivitamin (RENA-VIT) TABS tablet Take 1 tablet by mouth at bedtime. Patient not taking: Reported on 11/07/2020 04/09/20   Lattie Haw, MD  TRUEplus Lancets 33G MISC Use to test blood sugar three times per day. 11/20/19   [name], MD    Physical Exam: Constitutional: Moderately built and nourished. Vitals:   11/07/20 1600 11/07/20 1615 11/07/20 1630 11/07/20 1648  BP: 136/69  (!) 145/74   Pulse: 69 65 66   Resp:   15   Temp:    97.9 F (36.6 C)  TempSrc:    Oral  SpO2: 98% 98% 100%    Eyes: Anicteric no pallor. ENMT: No discharge from the ears eyes nose or mouth. Neck: No mass felt.  No neck rigidity. Respiratory: No rhonchi or crepitations. Cardiovascular: S1-S2 heard. Abdomen: Soft nontender bowel sounds present. Musculoskeletal:  Right great toe has an ulceration on the plantar aspect with some discharge. Skin: Right great toe has an ulcer on the plantar aspect. Neurologic: Alert awake oriented to time place and person.  Moves all extremities. Psychiatric: Appears normal.  Normal affect.   Labs on Admission: I have personally reviewed following labs and imaging studies  CBC: Recent Labs  Lab 11/07/20 1436  WBC 9.4  NEUTROABS 6.2  HGB 11.1*  HCT 34.4*  MCV 92.2  PLT 409   Basic Metabolic Panel: Recent Labs  Lab 11/07/20 1436  NA 135  K 4.1  CL 103  CO2 26  GLUCOSE 109*  BUN 25*  CREATININE 1.11  CALCIUM 8.9   GFR: Estimated Creatinine Clearance: 78.6 mL/min (by C-G formula based on SCr of 1.11 mg/dL). Liver Function Tests: Recent Labs  Lab 11/07/20 1436  AST 13*  ALT 13  ALKPHOS 70  BILITOT 0.6  PROT 7.3  ALBUMIN 3.5   No results for input(s): LIPASE, AMYLASE in the last 168 hours. No results for input(s): AMMONIA in the last 168 hours. Coagulation Profile: No  results for input(s): INR, PROTIME in the last 168 hours. Cardiac Enzymes: No results for input(s): CKTOTAL, CKMB, CKMBINDEX, TROPONINI in the last 168 hours. BNP (last 3 results) No results for input(s): PROBNP in the last 8760 hours. HbA1C: No results for input(s): HGBA1C in the last 72 hours. CBG: No results for input(s): GLUCAP in the last 168 hours. Lipid Profile: No results for input(s): CHOL, HDL, LDLCALC, TRIG, CHOLHDL, LDLDIRECT in the last 72 hours. Thyroid Function Tests: No results for input(s): TSH, T4TOTAL, FREET4, T3FREE, THYROIDAB in the last 72 hours. Anemia Panel: No results for input(s): VITAMINB12, FOLATE, FERRITIN, TIBC, IRON, RETICCTPCT in the last 72 hours. Urine analysis:    Component Value Date/Time   COLORURINE STRAW (A) 04/03/2020 2249   APPEARANCEUR CLEAR 04/03/2020 2249   LABSPEC 1.009 04/03/2020 2249   PHURINE 7.0 04/03/2020 2249   GLUCOSEU 50 (A) 04/03/2020 2249   HGBUR SMALL (A)  04/03/2020 2249   BILIRUBINUR NEGATIVE 04/03/2020 2249   KETONESUR NEGATIVE 04/03/2020 2249   PROTEINUR 100 (A) 04/03/2020 2249   UROBILINOGEN 1.0 01/20/2009 1450   NITRITE NEGATIVE 04/03/2020 2249   LEUKOCYTESUR NEGATIVE 04/03/2020 2249   Sepsis Labs: @LABRCNTIP (procalcitonin:4,lacticidven:4) )No results found for this or any previous visit (from the past 240 hour(s)).   Radiological Exams on Admission: DG Foot Complete Right  Result Date: 11/07/2020 CLINICAL DATA:  Great toe infection, concern for osteomyelitis EXAM: RIGHT FOOT COMPLETE - 3+ VIEW COMPARISON:  MR foot, 08/02/2020 FINDINGS: No fracture or dislocation of the right foot. There is subtle bony lucency of the lateral aspect of the tuft of the left great toe distal phalanx without overt cortical disruption. Overlying soft tissue ulceration. Diffuse soft tissue edema about the forefoot. IMPRESSION: 1. There is subtle bony lucency of the lateral aspect of the tuft of the left great toe distal phalanx without overt cortical disruption. Overlying soft tissue ulceration. Findings are suspicious for osteomyelitis. 2.  Diffuse soft tissue edema about the forefoot. Electronically Signed   By: Eddie Candle M.D.   On: 11/07/2020 14:42     Assessment/Plan Principal Problem:   Osteomyelitis of right foot (HCC) Active Problems:   Essential hypertension   DM (diabetes mellitus), type 2 with neurological complications (HCC)   Anemia in chronic illness   HFrEF (heart failure with reduced ejection fraction) (HCC)   Atrial fibrillation (HCC)   S/P BKA (below knee amputation) unilateral, left (HCC)    Right great toe osteomyelitis with ulceration with wound growing maggots and possible cellulitis for which patient has been empirically started on antibiotics MRI of the right foot has been ordered.  Based on which we will have further plans likely will need orthopedic consult. Peripheral vascular disease status post recent femoropopliteal bypass  the right lower extremity presently pulses are dopplerable.  No signs of any acute ischemia at this time.  Patient on Coumadin. History of A. fib presently rate controlled.  Continue amiodarone beta-blockers and Coumadin will be dosed per pharmacy. Diabetes mellitus type 2 on NovoLog 70/30 30 units in the morning and 15 at night with sliding scale coverage. History of chronic kidney disease stage III creatinine appears to be at baseline. Anemia of chronic disease hemoglobin appears to be at baseline. 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 CAD denies any chest pain.   Since patient has osteomyelitic changes with history of peripheral vascular disease will need close monitoring for any further worsening in inpatient status.   DVT prophylaxis: Coumadin. Code Status: Full code. Family  Communication: Discussed with patient. Disposition Plan: Home when stable. Consults called: Wound team. Admission status: Inpatient.   Rise Patience MD [facility] Pager [phone].  If 7PM-7AM, please contact night-coverage [email or web address] Password [facility]  11/07/2020, 5:28 PM

## 2020-11-07 NOTE — Plan of Care (Signed)

## 2020-11-07 NOTE — Progress Notes (Signed)
A consult was received from an ED physician for Vancomycin for cellulitis per pharmacy dosing.  The patient's profile has been reviewed for ht/wt/allergies/indication/available labs.    A one time order has been placed for Vancomycin 2gm.  Further antibiotics/pharmacy consults should be ordered by admitting physician if indicated.                       Thank you,  Minda Ditto PharmD 11/07/2020  1:47 PM

## 2020-11-07 NOTE — Progress Notes (Signed)
Vitals were taken at 10:50 but the didn't go through in the system.

## 2020-11-07 NOTE — Progress Notes (Signed)
Pharmacy Antibiotic Note  Rodney [name] is a 70 y.o. male admitted on 11/07/2020 with cellulitis, R great toe infection x 2 months, maggots noted in wound by GF.  Pharmacy has been consulted for Vancomycin & Cefepime dosing.  Plan: Cefepime 2gm q8 Vancomycin 2gm x1, then 1500mg  q24 Monitor renal fx, adjust abx as needed    Temp (24hrs), Avg:98 F (36.7 C), Min:97.9 F (36.6 C), Max:98 F (36.7 C)  Recent Labs  Lab 11/07/20 1436  WBC 9.4  CREATININE 1.11  LATICACIDVEN 0.8    Estimated Creatinine Clearance: 78.6 mL/min (by C-G formula based on SCr of 1.11 mg/dL).    No Known Allergies  Antimicrobials this admission: 7/14 Zosyn x1 7/14 Cefepime >>  7/14 Vancomycin >>   Dose adjustments this admission:  Microbiology results: 7/14 BCx: sent  Thank you for allowing pharmacy to be a part of this patient's care.  Minda Ditto PharmD 11/07/2020 5:50 PM

## 2020-11-07 NOTE — Telephone Encounter (Signed)
Noted and agree. 

## 2020-11-07 NOTE — ED Triage Notes (Signed)
Pt complains of right big toe infection. Reports his girlfriend went to clean his big toe d/t blood on it and noticed maggots in the wound. He said he knew of a blister 2 months ago

## 2020-11-07 NOTE — Progress Notes (Signed)
[facility] for Warfarin Indication: atrial fibrillation  No Known Allergies  Patient Measurements:    Vital Signs: Temp: 97.9 F (36.6 C) (07/14 1648) Temp Source: Oral (07/14 1648) BP: 145/74 (07/14 1630) Pulse Rate: 66 (07/14 1630)  Labs: Recent Labs    11/07/20 1436  HGB 11.1*  HCT 34.4*  PLT 326  CREATININE 1.11   Estimated Creatinine Clearance: 78.6 mL/min (by C-G formula based on SCr of 1.11 mg/dL).  Medical History: Past Medical History:  Diagnosis Date   CHF (congestive heart failure) (HCC)    Coronary artery disease    Diabetes mellitus without complication (HCC)    HLD (hyperlipidemia)    Hypertension    Peripheral edema 02/24/2018   SOB (shortness of breath) 07/26/2018   Medications:  Scheduled:   [START ON 11/08/2020] amiodarone  200 mg Oral Daily   [START ON 11/08/2020] atorvastatin  80 mg Oral Daily   carvedilol  6.25 mg Oral BID WC   [START ON 11/08/2020] insulin aspart  0-9 Units Subcutaneous TID WC   insulin aspart protamine- aspart  15 Units Subcutaneous Q supper   [START ON 11/08/2020] insulin aspart protamine- aspart  30 Units Subcutaneous Q breakfast   [START ON 11/08/2020] losartan  12.5 mg Oral Daily    Assessment:  10 yoM with R great toe infection x 2 months, Warfarin 5mg  qd for Afib, LD 7/13, recent DVT 08/14/20. Pharmacy to dose Warfarin Baseline H/H low, Plt wnl Baseline INR 1.9  Goal of Therapy:  INR 2-3 Monitor platelets by anticoagulation protocol: Yes   Plan:  Warfarin 5mg  tonight Daily Protime/INR Monitor CBC, s/s bleeding  Minda Ditto PharmD 11/07/2020,5:51 PM

## 2020-11-07 NOTE — Plan of Care (Signed)
  Problem: Clinical Measurements: Goal: Ability to maintain clinical measurements within normal limits will improve Outcome: Progressing   

## 2020-11-07 NOTE — Telephone Encounter (Signed)
[name] calls nurse line apologizing for missing apt this morning. [name] reports they did not have the gas money. [name] states patient has an infection on his big toe with maggots present. [name] denies any fever/chills or odor. [name] feels by the "look of it" he may need an amputation. I advised [name] he should be evaluated. [name] reports she will take him to the [facility] ED.

## 2020-11-07 NOTE — ED Provider Notes (Signed)
[facility] DEPT Provider Note   CSN: [id number] Arrival date & time: 11/07/20  1236     History Chief Complaint  Patient presents with   Wound Infection    Rodney [name] ODOR is a 70 y.o. male CHF, CAD, diabetes, hypertension, peripheral edema, PVD, gangrene of right toe, left BKA.  Patient presents emerged department with a chief complaint of right great toe infection.  Patient reports that he knew he had a wound to his right great toe 2 months prior however he thought it was improving.  Patient did not notice that the infection had gotten worse until today.  Patient does not check his feet on a regular basis.  Patient endorses numbness to right foot at baseline, no change in numbness.  Patient denies any pain or discomfort to affected foot.  No fevers, chills, nausea, vomiting.  Per chart review patient was admitted to emergency department and April 2022 for right lower extremity cellulitis and DVT.  Patient sees Dr. Carlis Abbott with vascular vein specialist, patient had right common iliac stent placed on 08/05/20 and right SFA to BK pop bypass on 4/13.  Patient initially had tissue loss with an ulcer on the right great toe after surgery however was reported to be healing on postop visit in May.    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   AKI (acute kidney injury) ([place]) 04/03/2020   CAD (coronary artery disease) 02/15/2020   CRI (chronic renal  insufficiency), stage 3 (moderate) ([place]) 02/15/2020   Long term (current) use of anticoagulants 12/29/2019   S/P BKA (below knee amputation) unilateral, left ([place]) 11/14/2019   High risk social situation 11/14/2019   Atrial fibrillation ([place]) 10/03/2019   HFrEF (heart failure with reduced ejection fraction) ([place]) 09/28/2019   Moderate aortic stenosis 09/28/2019   Anemia in chronic illness 09/27/2019   PVD (peripheral vascular disease) ([place])    Umbilical hernia 89/38/1017   Obesity 10/09/2015   Tobacco abuse 09/07/2013   DM (diabetes mellitus), type 2 with neurological complications ([place]) 51/05/5850   Hypercholesteremia 10/28/2010   ERECTILE DYSFUNCTION 05/22/2009   Essential hypertension 01/23/2009    Past Surgical History:  Procedure Laterality Date   ABDOMINAL AORTOGRAM W/LOWER EXTREMITY N/A 08/05/2020   Procedure: ABDOMINAL AORTOGRAM W/LOWER EXTREMITY;  Surgeon: Marty Heck, MD;  Location: [facility];  Service: Cardiovascular;  Laterality: N/A;   AMPUTATION Left 09/28/2019   Procedure: AMPUTATION BELOW KNEE;  Surgeon: Newt Minion, MD;  Location: [facility];  Service: Orthopedics;  Laterality: Left;   CARDIAC CATHETERIZATION     CARDIOVERSION N/A 10/05/2019   Procedure: CARDIOVERSION;  Surgeon: Sanda Klein, MD;  Location: [facility];  Service: Cardiovascular;  Laterality: N/A;   FEMORAL-POPLITEAL BYPASS GRAFT Right 08/07/2020   Procedure: RIGHT FEMORAL TO BELOW KNEE POPLITEAL ARTERY BYPASS;  Surgeon: Waynetta Sandy, MD;  Location: [facility];  Service: Vascular;  Laterality: Right;   LEFT HEART CATH AND CORONARY ANGIOGRAPHY N/A 10/03/2019   Procedure: LEFT HEART CATH AND CORONARY ANGIOGRAPHY;  Surgeon: Lorretta Harp, MD;  Location: [facility];  Service: Cardiovascular;  Laterality: N/A;   PERIPHERAL VASCULAR INTERVENTION Right 08/05/2020   Procedure: PERIPHERAL VASCULAR INTERVENTION;  Surgeon: Marty Heck, MD;  Location: [facility];   Service: Cardiovascular;  Laterality: Right;  common Iliac   TEE WITHOUT CARDIOVERSION N/A 10/05/2019   Procedure: TRANSESOPHAGEAL ECHOCARDIOGRAM (TEE);  Surgeon: Sanda Klein, MD;  Location[facility];  Service: Cardiovascular;  Laterality: N/A;       Family History  Problem Relation Age of Onset   Alcoholism Mother    Alcoholism Father     Social History   Tobacco Use   Smoking status: Every Day    Packs/day: 0.50    Years: 50.00    Pack years: 25.00    Types: Cigarettes   Smokeless tobacco: Never  Substance Use Topics   Alcohol use: Yes    Alcohol/week: 6.0 standard drinks    Types: 6 Standard drinks or equivalent per week   Drug use: No    Home Medications Prior to Admission medications   Medication Sig Start Date End Date Taking? Authorizing Provider  amiodarone (PACERONE) 200 MG tablet Take 1 tablet (200 mg total) by mouth daily. 10/14/20   [name], MD  atorvastatin (LIPITOR) 80 MG tablet TAKE 1 TABLET EVERY DAY Patient taking differently: Take 80 mg by mouth daily. 03/25/20   O'NealCassie Freer, MD  Blood Glucose Monitoring Suppl (TRUE METRIX METER) DEVI Use to test blood sugar three times daily. 11/20/19   [name], MD  Blood Glucose Monitoring Suppl (TRUE METRIX METER) w/Device KIT USE AS DIRECTED 03/25/20   [name], MD  carvedilol (COREG) 12.5 MG tablet Take 0.5 tablets (6.25 mg total) by mouth 2 (two) times daily with a meal. 10/14/20   Hensel, Jamal Collin, MD  furosemide (LASIX) 40 MG tablet Take 1 tablet (40 mg total) by mouth daily as needed for fluid (swelling). 10/14/20   [name], MD  glucose blood (RELION TRUE METRIX TEST STRIPS) test strip Use to test blood sugar three times per day. 11/20/19   [name], MD  insulin NPH-regular Human (NOVOLIN 70/30 RELION) (70-30) 100 UNIT/ML injection Inject 15-30 Units into the skin See admin instructions. Inject 30 units into the skin with breakfast and 15 units with supper-  "until eventually replaced by Lantus"    [provider]  losartan (COZAAR) 25 MG tablet Take 1/2 tablet (12.5 mg total) by mouth daily. 08/20/20   Lurline Del, DO  metFORMIN (GLUCOPHAGE) 1000 MG tablet Take 1 tablet (1,000 mg total) by mouth daily. 09/18/20   [name], MD  multivitamin (RENA-VIT) TABS tablet Take 1 tablet by mouth at bedtime. 04/09/20   Lattie Haw, MD  nicotine (NICODERM CQ - DOSED IN MG/24 HR) 7 mg/24hr patch Place 7 mg onto the skin daily.    [provider]  TRUEplus Lancets 33G MISC Use to test blood sugar three times per day. 11/20/19   [name], MD  warfarin (COUMADIN) 5 MG tablet Take 1 tablet (5 mg total) by mouth daily. 08/19/20   Lurline Del, DO    Allergies    Patient has no known allergies.  Review of Systems   Review of Systems  Constitutional:  Negative for chills and fever.  Eyes:  Negative for  visual disturbance.  Respiratory:  Negative for shortness of breath.   Cardiovascular:  Negative for chest pain.  Gastrointestinal:  Negative for abdominal pain, nausea and vomiting.  Musculoskeletal:  Negative for back pain and neck pain.  Skin:  Positive for wound. Negative for color change, pallor and rash.  Allergic/Immunologic: Positive for immunocompromised state.  Neurological:  Negative for dizziness, syncope, light-headedness and headaches.  Psychiatric/Behavioral:  Negative for confusion.    Physical Exam Updated Vital Signs BP (!) 145/71 (BP Location: Left Arm)   Pulse 77   Temp 98 F (36.7 C) (Oral)   Resp 18   SpO2 95%   Physical Exam Vitals and nursing note reviewed.  Constitutional:      General: He is not in acute distress.    Appearance: He is not ill-appearing, toxic-appearing or diaphoretic.  HENT:     Head: Normocephalic.  Eyes:     General: No scleral icterus.       Right eye: No discharge.        Left eye: No discharge.  Cardiovascular:     Rate and Rhythm: Normal rate.     Pulses:           Dorsalis pedis pulses are detected w/ Doppler on the right side.  Pulmonary:     Effort: Pulmonary effort is normal.  Musculoskeletal:     Comments: Erythema and warmth noted to medial aspect of right upper leg just proximal to RIGHT knee     Left Lower Extremity: Left leg is amputated below knee.  Feet:     Right foot:     Skin integrity: Ulcer, skin breakdown, callus and dry skin present. No blister, erythema, warmth or fissure.     Toenail Condition: Right toenails are abnormally thick.     Comments: 2 cm wound to lateral aspect of right great toe, necrotic tissue, live maggots are noted inside wound.  Foul-smelling odor.  Erythema noted to dorsum of right great toe.  1 cm wound noted to dorsum of second digit on right foot.  Purulent discharge noted.  Denies any sensation to right foot.  Cap refill less than 3 seconds  +2 pedal edema Skin:    General: Skin is warm and dry.  Neurological:     General: No focal deficit present.     Mental Status: He is alert.  Psychiatric:        Behavior: Behavior is cooperative.         ED Results / Procedures / Treatments   Labs (all labs ordered are listed, but only abnormal results are displayed) Labs Reviewed  CBC WITH DIFFERENTIAL/PLATELET - Abnormal; Notable for the following components:      Result Value   RBC 3.73 (*)    Hemoglobin 11.1 (*)    HCT 34.4 (*)    All other components within normal limits  COMPREHENSIVE METABOLIC PANEL - Abnormal; Notable for the following components:   Glucose, Bld 109 (*)    BUN 25 (*)    AST 13 (*)    All other components within normal limits  CULTURE, BLOOD (ROUTINE X 2)  CULTURE, BLOOD (ROUTINE X 2)  SARS CORONAVIRUS 2 (TAT 6-24 HRS)  LACTIC ACID, PLASMA  LACTIC ACID, PLASMA    EKG None  Radiology DG Foot Complete Right  Result Date: 11/07/2020 CLINICAL DATA:  Great toe infection, concern for osteomyelitis EXAM: RIGHT FOOT COMPLETE - 3+ VIEW COMPARISON:  MR foot, 08/02/2020  FINDINGS: No fracture or dislocation of the  right foot. There is subtle bony lucency of the lateral aspect of the tuft of the left great toe distal phalanx without overt cortical disruption. Overlying soft tissue ulceration. Diffuse soft tissue edema about the forefoot. IMPRESSION: 1. There is subtle bony lucency of the lateral aspect of the tuft of the left great toe distal phalanx without overt cortical disruption. Overlying soft tissue ulceration. Findings are suspicious for osteomyelitis. 2.  Diffuse soft tissue edema about the forefoot. Electronically Signed   By: Eddie Candle M.D.   On: 11/07/2020 14:42    Procedures Procedures   Medications Ordered in ED Medications  piperacillin-tazobactam (ZOSYN) IVPB 3.375 g (has no administration in time range)    ED Course  I have reviewed the triage vital signs and the nursing notes.  Pertinent labs & imaging results that were available during my care of the patient were reviewed by me and considered in my medical decision making (see chart for details).  Clinical Course as of 11/08/20 0006  Thu Nov 07, 2020  1614 Spoke to hosp  [PB]    Clinical Course User Index [PB] Dyann Ruddle   MDM Rules/Calculators/A&P                          Alert 70 year old male no acute distress, nontoxic-appearing.  Patient presents emergency department with a chief complaint of wound to his right great toe.  Patient reports that he has had wound over the last 2 months however noticed today that it was worsened usual with maggots in the wound.  Patient denies any systemic symptoms.  Wound as noted above.  Cap refill less than 3 seconds.  DP pulse found with doppler.  patient has erythema and warmth also noted to erythema to dorsum of right great toe.  Lactic acid within normal limits, no leukocytosis.  X-ray imaging of right foot concerning for osteomyelitis.  Patient was started on Zosyn and vancomycin empirically.  Will consult hospitalist team  for admission.  1614 spoke to hospitalist Dr. Hal Hope who agreed to see the patient for admission.   Final Clinical Impression(s) / ED Diagnoses Final diagnoses:  Open wound of right great toe, initial encounter    Rx / DC Orders ED Discharge Orders     None        Dyann Ruddle 11/08/20 0007    Blanchie Dessert, MD 11/10/20 2210

## 2020-11-08 DIAGNOSIS — E11628 Type 2 diabetes mellitus with other skin complications: Secondary | ICD-10-CM | POA: Diagnosis not present

## 2020-11-08 DIAGNOSIS — I480 Paroxysmal atrial fibrillation: Secondary | ICD-10-CM | POA: Diagnosis not present

## 2020-11-08 DIAGNOSIS — L089 Local infection of the skin and subcutaneous tissue, unspecified: Secondary | ICD-10-CM

## 2020-11-08 DIAGNOSIS — M869 Osteomyelitis, unspecified: Secondary | ICD-10-CM | POA: Diagnosis not present

## 2020-11-08 DIAGNOSIS — I10 Essential (primary) hypertension: Secondary | ICD-10-CM | POA: Diagnosis not present

## 2020-11-08 DIAGNOSIS — I96 Gangrene, not elsewhere classified: Secondary | ICD-10-CM | POA: Diagnosis not present

## 2020-11-08 LAB — BASIC METABOLIC PANEL
Anion gap: 5 (ref 5–15)
BUN: 21 mg/dL (ref 8–23)
CO2: 27 mmol/L (ref 22–32)
Calcium: 8.7 mg/dL — ABNORMAL LOW (ref 8.9–10.3)
Chloride: 103 mmol/L (ref 98–111)
Creatinine, Ser: 1.17 mg/dL (ref 0.61–1.24)
GFR, Estimated: >60 mL/min (ref >60)
Glucose, Bld: 122 mg/dL — ABNORMAL HIGH (ref 70–99)
Potassium: 4.2 mmol/L (ref 3.5–5.1)
Sodium: 135 mmol/L (ref 135–145)

## 2020-11-08 LAB — SARS CORONAVIRUS 2 (TAT 6-24 HRS): SARS Coronavirus 2: NEGATIVE

## 2020-11-08 LAB — CBC
HCT: 31.1 % — ABNORMAL LOW (ref 39.0–52.0)
Hemoglobin: 9.9 g/dL — ABNORMAL LOW (ref 13.0–17.0)
MCH: 30.1 pg (ref 26.0–34.0)
MCHC: 31.8 g/dL (ref 30.0–36.0)
MCV: 94.5 fL (ref 80.0–100.0)
Platelets: 274 10*3/uL (ref 150–400)
RBC: 3.29 MIL/uL — ABNORMAL LOW (ref 4.22–5.81)
RDW: 14.3 % (ref 11.5–15.5)
WBC: 7.7 10*3/uL (ref 4.0–10.5)
nRBC: 0 % (ref 0.0–0.2)

## 2020-11-08 LAB — GLUCOSE, CAPILLARY
Glucose-Capillary: 122 mg/dL — ABNORMAL HIGH (ref 70–99)
Glucose-Capillary: 130 mg/dL — ABNORMAL HIGH (ref 70–99)
Glucose-Capillary: 132 mg/dL — ABNORMAL HIGH (ref 70–99)
Glucose-Capillary: 120 mg/dL — ABNORMAL HIGH (ref 70–99)

## 2020-11-08 LAB — HIV ANTIBODY (ROUTINE TESTING W REFLEX): HIV Screen 4th Generation wRfx: NONREACTIVE

## 2020-11-08 LAB — PROTIME-INR
INR: 3.4 — ABNORMAL HIGH (ref 0.8–1.2)
Prothrombin Time: 34.4 seconds — ABNORMAL HIGH (ref 11.4–15.2)

## 2020-11-08 MED ORDER — METRONIDAZOLE 500 MG/100ML IV SOLN
500.0000 mg | Freq: Three times a day (TID) | INTRAVENOUS | Status: DC
  Administered 2020-11-08 – 2020-11-15 (×22): 500 mg via INTRAVENOUS
  Filled 2020-11-08 (×22): qty 100

## 2020-11-08 MED ORDER — ENSURE MAX PROTEIN PO LIQD
11.0000 [oz_av] | Freq: Two times a day (BID) | ORAL | Status: DC
  Administered 2020-11-09 – 2020-11-17 (×14): 11 [oz_av] via ORAL
  Filled 2020-11-08 (×18): qty 330

## 2020-11-08 MED ORDER — ADULT MULTIVITAMIN W/MINERALS CH
1.0000 | ORAL_TABLET | Freq: Every day | ORAL | Status: DC
  Administered 2020-11-08 – 2020-11-17 (×9): 1 via ORAL
  Filled 2020-11-08 (×10): qty 1

## 2020-11-08 MED ORDER — JUVEN PO PACK
1.0000 | PACK | Freq: Two times a day (BID) | ORAL | Status: DC
  Administered 2020-11-09 – 2020-11-12 (×6): 1 via ORAL
  Filled 2020-11-08 (×10): qty 1

## 2020-11-08 NOTE — Consult Note (Signed)
Reason for Consult:left great toe skin necrosis  Referring Physician: Aline August MD  Rodney [name] is an 70 y.o. male.  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  He was admitted with blisters on his dorsum of his foot also just above his ankle which has improved within 24 hours.  He has significant medical history had left BKA by Dr. Sharol Given 1 year ago.  History of coronary disease diabetes chronic kidney disease atrial fibs chronic Coumadin, patient states he lives with his girlfriend.  He states he had his toe covered most of the time and is unsure how he got any maggots but states he had been barefooted.  Patient had a femoropopliteal bypass on the right side 08/07/2020 has dopplerable pulse.  Patient had 50% common iliac calcified stenosis.  High-grade greater than 80% calcified common iliac stenosis appear flow-limiting.  Occluded above-knee popliteal artery and had bypass to the popliteal artery below the knee.  Patient had moderate posterior tib and popliteal trunk disease runoff appeared to be through the anterior tib and peroneal appear to be mid calf occlusion.  Runoff to the foot was through the posterior tib. Recent problems with hypoxia with conscious sedation.  Insulin-dependent diabetes and smoking history. Past Medical History:  Diagnosis Date   CHF (congestive heart failure) (HCC)    Coronary artery disease    Diabetes mellitus without complication (HCC)    HLD (hyperlipidemia)    Hypertension    Peripheral edema 02/24/2018   SOB (shortness of breath) 07/26/2018    Past Surgical History:  Procedure Laterality Date   ABDOMINAL AORTOGRAM W/LOWER EXTREMITY N/A 08/05/2020   Procedure: ABDOMINAL AORTOGRAM W/LOWER EXTREMITY;  Surgeon: Marty Heck, MD;  Location: [facility];  Service: Cardiovascular;  Laterality: N/A;   AMPUTATION Left 09/28/2019   Procedure: AMPUTATION BELOW KNEE;  Surgeon: Newt Minion, MD;   Location: [facility];  Service: Orthopedics;  Laterality: Left;   CARDIAC CATHETERIZATION     CARDIOVERSION N/A 10/05/2019   Procedure: CARDIOVERSION;  Surgeon: Sanda Klein, MD;  Location: [facility];  Service: Cardiovascular;  Laterality: N/A;   FEMORAL-POPLITEAL BYPASS GRAFT Right 08/07/2020   Procedure: RIGHT FEMORAL TO BELOW KNEE POPLITEAL ARTERY BYPASS;  Surgeon: Waynetta Sandy, MD;  Location: [facility];  Service: Vascular;  Laterality: Right;   LEFT HEART CATH AND CORONARY ANGIOGRAPHY N/A 10/03/2019   Procedure: LEFT HEART CATH AND CORONARY ANGIOGRAPHY;  Surgeon: Lorretta Harp, MD;  Location: [facility];  Service: Cardiovascular;  Laterality: N/A;   PERIPHERAL VASCULAR INTERVENTION Right 08/05/2020   Procedure: PERIPHERAL VASCULAR INTERVENTION;  Surgeon: Marty Heck, MD;  Location: [facility];  Service: Cardiovascular;  Laterality: Right;  common Iliac   TEE WITHOUT CARDIOVERSION N/A 10/05/2019   Procedure: TRANSESOPHAGEAL ECHOCARDIOGRAM (TEE);  Surgeon: Sanda Klein, MD;  Location[facility];  Service: Cardiovascular;  Laterality: N/A;    Family History  Problem Relation Age of Onset   Alcoholism Mother    Alcoholism Father     Social History:  reports that he has been smoking cigarettes. He has a 25.00 pack-year smoking history. He has never used smokeless tobacco. He reports current alcohol use of about 6.0 standard drinks of alcohol per week. He reports that he does not use drugs.  Allergies: No Known Allergies  Medications: I have reviewed the patient's current medications.  Results for orders placed or performed during the hospital encounter of 11/07/20 (from the  past 48 hour(s))  Lactic acid, plasma     Status: None   Collection Time: 11/07/20  2:36 PM  Result Value Ref Range   Lactic Acid, Venous 0.8 0.5 - 1.9 mmol/L    Comment: Performed at [facility], [place] [address], [place], [place] [address]  CBC with  Differential     Status: Abnormal   Collection Time: 11/07/20  2:36 PM  Result Value Ref Range   WBC 9.4 4.0 - 10.5 K/uL   RBC 3.73 (L) 4.22 - 5.81 MIL/uL   Hemoglobin 11.1 (L) 13.0 - 17.0 g/dL   HCT 34.4 (L) 39.0 - 52.0 %   MCV 92.2 80.0 - 100.0 fL   MCH 29.8 26.0 - 34.0 pg   MCHC 32.3 30.0 - 36.0 g/dL   RDW 14.3 11.5 - 15.5 %   Platelets 326 150 - 400 K/uL   nRBC 0.0 0.0 - 0.2 %   Neutrophils Relative % 66 %   Neutro Abs 6.2 1.7 - 7.7 K/uL   Lymphocytes Relative 22 %   Lymphs Abs 2.1 0.7 - 4.0 K/uL   Monocytes Relative 8 %   Monocytes Absolute 0.8 0.1 - 1.0 K/uL   Eosinophils Relative 3 %   Eosinophils Absolute 0.2 0.0 - 0.5 K/uL   Basophils Relative 1 %   Basophils Absolute 0.1 0.0 - 0.1 K/uL   Immature Granulocytes 0 %   Abs Immature Granulocytes 0.03 0.00 - 0.07 K/uL    Comment: Performed at [facility], [place] [address] [place], [place] [address]  Comprehensive metabolic panel     Status: Abnormal   Collection Time: 11/07/20  2:36 PM  Result Value Ref Range   Sodium 135 135 - 145 mmol/L   Potassium 4.1 3.5 - 5.1 mmol/L   Chloride 103 98 - 111 mmol/L   CO2 26 22 - 32 mmol/L   Glucose, Bld 109 (H) 70 - 99 mg/dL    Comment: Glucose reference range applies only to samples taken after fasting for at least 8 hours.   BUN 25 (H) 8 - 23 mg/dL   Creatinine, Ser 1.11 0.61 - 1.24 mg/dL   Calcium 8.9 8.9 - 10.3 mg/dL   Total Protein 7.3 6.5 - 8.1 g/dL   Albumin 3.5 3.5 - 5.0 g/dL   AST 13 (L) 15 - 41 U/L   ALT 13 0 - 44 U/L   Alkaline Phosphatase 70 38 - 126 U/L   Total Bilirubin 0.6 0.3 - 1.2 mg/dL   GFR, Estimated >60 >60 mL/min    Comment: (NOTE) Calculated using the CKD-EPI Creatinine Equation (2021)    Anion gap 6 5 - 15    Comment: Performed at [facility], [place] [address], [place], [place] [address]  Blood culture (routine x 2)     Status: None (Preliminary result)   Collection Time: 11/07/20  2:36 PM   Specimen: BLOOD   Result Value Ref Range   Specimen Description BLOOD BLOOD RIGHT FOREARM    Special Requests      BOTTLES DRAWN AEROBIC AND ANAEROBIC Blood Culture adequate volume   Culture      NO GROWTH < 24 HOURS Performed at [facility], [place] [address] [place], [place] [address]    Report Status PENDING   Blood culture (routine x 2)     Status: None (Preliminary result)   Collection Time: 11/07/20  2:36 PM   Specimen: BLOOD RIGHT HAND  Result Value Ref  Range   Specimen Description BLOOD RIGHT HAND    Special Requests      BOTTLES DRAWN AEROBIC AND ANAEROBIC Blood Culture adequate volume   Culture      NO GROWTH < 12 HOURS Performed at [facility], [address], [place], [place] [address]    Report Status PENDING   SARS CORONAVIRUS 2 (TAT 6-24 HRS) Nasopharyngeal Nasopharyngeal Swab     Status: None   Collection Time: 11/07/20  4:24 PM   Specimen: Nasopharyngeal Swab  Result Value Ref Range   SARS Coronavirus 2 NEGATIVE NEGATIVE    Comment: (NOTE) SARS-CoV-2 target nucleic acids are NOT DETECTED.  The SARS-CoV-2 RNA is generally detectable in upper and lower respiratory specimens during the acute phase of infection. Negative results do not preclude SARS-CoV-2 infection, do not rule out co-infections with other pathogens, and should not be used as the sole basis for treatment or other patient management decisions. Negative results must be combined with clinical observations, patient history, and epidemiological information. The expected result is Negative.  Fact Sheet for Patients: SugarRoll.be  Fact Sheet for Healthcare Providers: https://www.woods-mathews.com/  This test is not yet approved or cleared by the [place] FDA and  has been authorized for detection and/or diagnosis of SARS-CoV-2 by FDA under an Emergency Use Authorization (EUA). This EUA will remain  in effect (meaning this test can be used) for the duration  of the COVID-19 declaration under Se ction 564(b)(1) of the Act, 21 U.S.C. section 360bbb-3(b)(1), unless the authorization is terminated or revoked sooner.  Performed at [facility], [place] [address] [place], [place] [address]   HIV Antibody (routine testing w rflx)     Status: None   Collection Time: 11/07/20  6:32 PM  Result Value Ref Range   HIV Screen 4th Generation wRfx Non Reactive Non Reactive    Comment: Performed at [facility], [place] [address] [place], [place] [address]  Protime-INR     Status: Abnormal   Collection Time: 11/07/20  6:32 PM  Result Value Ref Range   Prothrombin Time 21.4 (H) 11.4 - 15.2 seconds   INR 1.9 (H) 0.8 - 1.2    Comment: (NOTE) INR goal varies based on device and disease states. Performed at [facility], [place] [address] [place], [place] [address]   Glucose, capillary     Status: Abnormal   Collection Time: 11/07/20 10:26 PM  Result Value Ref Range   Glucose-Capillary 132 (H) 70 - 99 mg/dL    Comment: Glucose reference range applies only to samples taken after fasting for at least 8 hours.  Basic metabolic panel     Status: Abnormal   Collection Time: 11/08/20  4:42 AM  Result Value Ref Range   Sodium 135 135 - 145 mmol/L   Potassium 4.2 3.5 - 5.1 mmol/L   Chloride 103 98 - 111 mmol/L   CO2 27 22 - 32 mmol/L   Glucose, Bld 122 (H) 70 - 99 mg/dL    Comment: Glucose reference range applies only to samples taken after fasting for at least 8 hours.   BUN 21 8 - 23 mg/dL   Creatinine, Ser 1.17 0.61 - 1.24 mg/dL   Calcium 8.7 (L) 8.9 - 10.3 mg/dL   GFR, Estimated >60 >60 mL/min    Comment: (NOTE) Calculated using the CKD-EPI Creatinine Equation (2021)    Anion gap 5 5 - 15    Comment: Performed at [facility],  [place] [address] [place], [place] [address]  CBC     Status: Abnormal   Collection Time: 11/08/20  4:42 AM  Result Value Ref Range   WBC 7.7 4.0 - 10.5 K/uL   RBC 3.29 (L) 4.22 -  5.81 MIL/uL   Hemoglobin 9.9 (L) 13.0 - 17.0 g/dL   HCT 31.1 (L) 39.0 - 52.0 %   MCV 94.5 80.0 - 100.0 fL   MCH 30.1 26.0 - 34.0 pg   MCHC 31.8 30.0 - 36.0 g/dL   RDW 14.3 11.5 - 15.5 %   Platelets 274 150 - 400 K/uL   nRBC 0.0 0.0 - 0.2 %    Comment: Performed at [facility], [place] [address] [place], [place] [address]  Protime-INR     Status: Abnormal   Collection Time: 11/08/20  4:42 AM  Result Value Ref Range   Prothrombin Time 34.4 (H) 11.4 - 15.2 seconds   INR 3.4 (H) 0.8 - 1.2    Comment: (NOTE) INR goal varies based on device and disease states. Performed at [facility], [place] [address] [place], [place] [address]   Glucose, capillary     Status: Abnormal   Collection Time: 11/08/20  7:26 AM  Result Value Ref Range   Glucose-Capillary 122 (H) 70 - 99 mg/dL    Comment: Glucose reference range applies only to samples taken after fasting for at least 8 hours.  Glucose, capillary     Status: Abnormal   Collection Time: 11/08/20 11:19 AM  Result Value Ref Range   Glucose-Capillary 130 (H) 70 - 99 mg/dL    Comment: Glucose reference range applies only to samples taken after fasting for at least 8 hours.  Glucose, capillary     Status: Abnormal   Collection Time: 11/08/20  3:45 PM  Result Value Ref Range   Glucose-Capillary 132 (H) 70 - 99 mg/dL    Comment: Glucose reference range applies only to samples taken after fasting for at least 8 hours.    MR FOOT RIGHT WO CONTRAST  Result Date: 11/08/2020 CLINICAL DATA:  Diabetic foot ulcer.  Concern for osteomyelitis. EXAM: MRI OF THE RIGHT FOREFOOT WITHOUT CONTRAST TECHNIQUE: Multiplanar, multisequence MR imaging of the right forefoot was performed. No intravenous contrast was administered. COMPARISON:  Radiographs 11/07/2020.  MRI 08/02/2020. FINDINGS: Bones/Joint/Cartilage There is no evidence bone marrow or cortical destruction. No erosive changes or significant joint effusions. Mild  degenerative changes at 1st metatarsophalangeal joint. The alignment is normal at the Lisfranc joint. Ligaments The Lisfranc ligament is intact. The collateral ligaments of the metatarsophalangeal joints are intact. 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 No focal fluid collection, tendon tear or significant tenosynovitis. Soft tissues There is mild soft tissue ulceration along the plantar and lateral aspect of the distal great toe. No drainable fluid collection is apparent in this area. No evidence of adjacent osteomyelitis. Dorsal subcutaneous edema has improved compared with prior study. IMPRESSION: 1. Focal soft tissue ulceration in the plantar and lateral aspect of the great toe without evidence of underlying focal abscess. 2. Improved nonspecific dorsal subcutaneous edema. 3. No evidence of osteomyelitis or septic joint. Electronically Signed   By: Richardean Sale M.D.   On: 11/08/2020 07:12   DG Foot Complete Right  Result Date: 11/07/2020 CLINICAL DATA:  Great toe infection, concern for osteomyelitis EXAM: RIGHT FOOT COMPLETE - 3+ VIEW COMPARISON:  MR foot, 08/02/2020 FINDINGS: No fracture or dislocation of the right foot.  There is subtle bony lucency of the lateral aspect of the tuft of the left great toe distal phalanx without overt cortical disruption. Overlying soft tissue ulceration. Diffuse soft tissue edema about the forefoot. IMPRESSION: 1. There is subtle bony lucency of the lateral aspect of the tuft of the left great toe distal phalanx without overt cortical disruption. Overlying soft tissue ulceration. Findings are suspicious for osteomyelitis. 2.  Diffuse soft tissue edema about the forefoot. Electronically Signed   By: Eddie Candle M.D.   On: 11/07/2020 14:42    ROS all other systems noncontributory to HPI. Blood pressure (!) 103/92, pulse 65, temperature 98.4 F (36.9 C), temperature source Oral, resp. rate 17, height 6' (1.829 m),  weight 100 kg, SpO2 98 %. Physical Exam HENT:     Head: Normocephalic.     Right Ear: External ear normal.     Left Ear: External ear normal.  Eyes:     Extraocular Movements: Extraocular movements intact.  Pulmonary:     Effort: Pulmonary effort is normal.  Abdominal:     Palpations: Abdomen is soft.  Musculoskeletal:     Cervical back: Normal range of motion.  Skin:    Coloration: Skin is not jaundiced.  Neurological:     Mental Status: He is alert.  Psychiatric:        Mood and Affect: Mood normal.   Patient's great toe has some maggots lateral plantar surface full-thickness skin necrosis.  No bone is exposed.  Or resolving blister dorsum of the foot and also distal anterior tibial surface above the ankle.  Today they look much improved from his admission photographs yesterday. Assessment/Plan: PAD with right great toe skin necrosis.  Discussed with patient that toe amputation versus first ray may not heal with his more proximal areas of stenosis.  I am sure his blood supply is improved after his femoropopliteal.  Would have Vascular surgery team take a look to see if there is anything they feel they could do to improve this.  Dr. Sharol Given can see him on Monday and I will continue to follow him over the weekend.  Rodney [name] 11/08/2020, 5:02 PM

## 2020-11-08 NOTE — Consult Note (Signed)
[facility] Inpatient Consult   11/08/2020  ALDON HENGST [birth year] [phone]  [place] [facility] [[facility]] Patient: [employer]   Patient evaluated for community based chronic complex disease management services with [facility]. Noted high readmission risk score with 3 admissions within past 6 months.   Plan: Continue to follow for progression. Patient primary care office has embedded case management services.   Of note, [facility] Care Management services does not replace or interfere with any services that are arranged by inpatient case management or social work.   Netta Cedars, MSN, [facility] Liaison Nurse Mobile Phone [phone]  Toll free office [phone]

## 2020-11-08 NOTE — Progress Notes (Signed)
Initial Nutrition Assessment  INTERVENTION:   -Ensure MAX Protein po BID, each supplement provides 150 kcal and 30 grams of protein   -Juven Fruit Punch BID, each serving provides 95kcal and 2.5g of protein (amino acids glutamine and arginine)  -Multivitamin with minerals daily  NUTRITION DIAGNOSIS:   Increased nutrient needs related to wound healing as evidenced by estimated needs.  GOAL:   Patient will meet greater than or equal to 90% of their needs  MONITOR:   PO intake, Supplement acceptance, Labs, Weight trends, I & O's, Skin  REASON FOR ASSESSMENT:   Malnutrition Screening Tool    ASSESSMENT:   70 y.o. male with history of peripheral vascular disease status post femoropopliteal bypass in April 2022 following which patient also was admitted the same month for cellulitis of the right lower extremity treated with antibiotics with history of CAD, diabetes mellitus, chronic kidney disease, hypertension and A. fib on Coumadin presents to the ER after patient's family noted that patient's right great toe had a wound from which maggots were going.  Patient in room receiving patient care at time of visit. Pt with worsening infection of right toes, h/o left BKA. Currently consuming 100% of meals. Will order supplements to aid in wound healing.  Per pt, he has lost 35 lbs over the past year. Per weight records, pt has lost 23 lbs since 4/19 (9% wt loss x 3 months, significant for time frame).  Medications reviewed.  Labs reviewed: CBGs: 122-132   NUTRITION - FOCUSED PHYSICAL EXAM:  Unable to complete  Diet Order:   Diet Order             Diet heart healthy/carb modified Room service appropriate? Yes; Fluid consistency: Thin; Fluid restriction: 1200 mL Fluid  Diet effective now                   EDUCATION NEEDS:   No education needs have been identified at this time  Skin:  Skin Assessment: Skin Integrity Issues: Skin Integrity Issues:: Other (Comment) Other:  osteomyelitis of right foot H/o left BKA  Last BM:  7/14  Height:   Ht Readings from Last 1 Encounters:  11/07/20 6' (1.829 m)    Weight:   Wt Readings from Last 1 Encounters:  11/07/20 100 kg    BMI:  Body mass index is 29.9 kg/m.  Estimated Nutritional Needs:   Kcal:  2100-2300  Protein:  90-105g  Fluid:  2.1L/day   Clayton Bibles, MS, RD, LDN Inpatient Clinical Dietitian Contact information available via Amion

## 2020-11-08 NOTE — Progress Notes (Signed)
Patient ID: Rodney [name], male   DOB: [birth year], 70 y.o.   MRN: [id number]  PROGRESS NOTE    JQUAN EGELSTON  GDJ:[id number] DOB: [birth year] DOA: 11/07/2020 PCP: [name], MD   Brief Narrative:  70 year old male with history of peripheral vascular disease status post femoropopliteal bypass in April 2022 following which patient was also admitted the same month for cellulitis of right lower extremity treated with antibiotics, left BKA for necrotizing fasciitis in 2021, CAD, diabetes mellitus type 2, hypertension, paroxysmal A. fib on Coumadin, right lower extremity DVT diagnosed in April 2022 presented with right great toe worsening wound with maggots.  On presentation, x-ray revealed possible right great toe osteomyelitis.  He was started on IV antibiotics.  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  MRI of right foot showed soft tissue ulceration in the plantar and lateral aspect of the right great toe without underlying focal abscess with no evidence of osteomyelitis or septic joint. -Currently on cefepime and vancomycin.  Will add Flagyl.  Spoke to Dr. Ames Coupe who will see the patient in consultation  Paroxysmal A. fib on Coumadin Supratherapeutic INR History of right lower extremity DVT diagnosed in April 2022 -INR 3.4 today.  Hold Coumadin dose today in case patient undergoes surgical intervention.  Pharmacy dosing Coumadin.  Monitor daily INR -Rate controlled.  Continue amiodarone.  Outpatient follow-up with cardiology  Peripheral vascular disease status post femoropopliteal bypass -Outpatient follow-up with vascular surgery  Diabetes mellitus type 2 -Continue 70/30 insulin along with CBGs with SSI  Chronic kidney disease stage IIIa -Creatinine stable.  Monitor  Anemia of chronic disease -Possibly from kidney disease.  Hemoglobin  stable.  Monitor  CAD -Continue Coreg, statin and losartan.  Outpatient follow-up with cardiology  Hypertension -Continue Coreg, losartan  Hyperlipidemia -Continue statin  DVT prophylaxis: Hold Coumadin today Code Status: Full Family Communication: None at bedside Disposition Plan: Status is: Inpatient  Remains inpatient appropriate because:Inpatient level of care appropriate due to severity of illness  Dispo: The patient is from: Home              Anticipated d/c is to: Home              Patient currently is not medically stable to d/c.   Difficult to place patient No  Consultants: Orthopedics/Dr. Lorin Mercy  Procedures: None  Antimicrobials:  Cefepime and vancomycin from 11/07/2020 onwards  Subjective: Patient seen and examined at bedside.  No overnight fever, vomiting reported.  Denies worsening foot pain.  Objective: Vitals:   11/07/20 2237 11/08/20 0149 11/08/20 0555 11/08/20 0902  BP: (!) 88/58 131/64 (!) 164/86 (!) 153/72  Pulse: 96  68 77  Resp: 18 20 20    Temp: 98 F (36.7 C) 98.2 F (36.8 C) 98.8 F (37.1 C)   TempSrc: Oral Oral Oral   SpO2: 97%  95%   Weight:      Height:        Intake/Output Summary (Last 24 hours) at 11/08/2020 1045 Last data filed at 11/08/2020 0900 Gross per 24 hour  Intake 864.69 ml  Output 200 ml  Net 664.69 ml   Filed Weights   11/07/20 2211  Weight: 100 kg    Examination:  General exam: Appears calm and comfortable  Respiratory system: Bilateral decreased breath sounds at bases Cardiovascular system: S1 & S2 heard, Rate controlled Gastrointestinal system: Abdomen is  nondistended, soft and nontender. Normal bowel sounds heard. Extremities: No cyanosis, clubbing, edema  Central nervous system: Alert and oriented. No focal neurological deficits. Moving extremities Skin: Right foot dressing present: Upon removal, necrotic changes noted over the right great toe with maggots with plantar ulcer Psychiatry: Judgement and  insight appear normal. Mood & affect appropriate.     Data Reviewed: I have personally reviewed following labs and imaging studies  CBC: Recent Labs  Lab 11/07/20 1436 11/08/20 0442  WBC 9.4 7.7  NEUTROABS 6.2  --   HGB 11.1* 9.9*  HCT 34.4* 31.1*  MCV 92.2 94.5  PLT 326 536   Basic Metabolic Panel: Recent Labs  Lab 11/07/20 1436 11/08/20 0442  NA 135 135  K 4.1 4.2  CL 103 103  CO2 26 27  GLUCOSE 109* 122*  BUN 25* 21  CREATININE 1.11 1.17  CALCIUM 8.9 8.7*   GFR: Estimated Creatinine Clearance: 74 mL/min (by C-G formula based on SCr of 1.17 mg/dL). Liver Function Tests: Recent Labs  Lab 11/07/20 1436  AST 13*  ALT 13  ALKPHOS 70  BILITOT 0.6  PROT 7.3  ALBUMIN 3.5   No results for input(s): LIPASE, AMYLASE in the last 168 hours. No results for input(s): AMMONIA in the last 168 hours. Coagulation Profile: Recent Labs  Lab 11/07/20 1832 11/08/20 0442  INR 1.9* 3.4*   Cardiac Enzymes: No results for input(s): CKTOTAL, CKMB, CKMBINDEX, TROPONINI in the last 168 hours. BNP (last 3 results) No results for input(s): PROBNP in the last 8760 hours. HbA1C: No results for input(s): HGBA1C in the last 72 hours. CBG: Recent Labs  Lab 11/07/20 2226 11/08/20 0726  GLUCAP 132* 122*   Lipid Profile: No results for input(s): CHOL, HDL, LDLCALC, TRIG, CHOLHDL, LDLDIRECT in the last 72 hours. Thyroid Function Tests: No results for input(s): TSH, T4TOTAL, FREET4, T3FREE, THYROIDAB in the last 72 hours. Anemia Panel: No results for input(s): VITAMINB12, FOLATE, FERRITIN, TIBC, IRON, RETICCTPCT in the last 72 hours. Sepsis Labs: Recent Labs  Lab 11/07/20 1436  LATICACIDVEN 0.8    Recent Results (from the past 240 hour(s))  Blood culture (routine x 2)     Status: None (Preliminary result)   Collection Time: 11/07/20  2:36 PM   Specimen: BLOOD  Result Value Ref Range Status   Specimen Description BLOOD BLOOD RIGHT FOREARM  Final   Special Requests    Final    BOTTLES DRAWN AEROBIC AND ANAEROBIC Blood Culture adequate volume   Culture   Final    NO GROWTH < 24 HOURS Performed at [facility], [place] [address] [place], [place] [address]    Report Status PENDING  Incomplete  Blood culture (routine x 2)     Status: None (Preliminary result)   Collection Time: 11/07/20  2:36 PM   Specimen: BLOOD RIGHT HAND  Result Value Ref Range Status   Specimen Description BLOOD RIGHT HAND  Final   Special Requests   Final    BOTTLES DRAWN AEROBIC AND ANAEROBIC Blood Culture adequate volume   Culture   Final    NO GROWTH < 12 HOURS Performed at [facility], [place] [address], [place],  [address]    Report Status PENDING  Incomplete  SARS CORONAVIRUS 2 (TAT 6-24 HRS) Nasopharyngeal Nasopharyngeal Swab     Status: None   Collection Time: 11/07/20  4:24 PM   Specimen: Nasopharyngeal Swab  Result Value Ref Range Status   SARS Coronavirus 2 NEGATIVE NEGATIVE Final  Comment: (NOTE) SARS-CoV-2 target nucleic acids are NOT DETECTED.  The SARS-CoV-2 RNA is generally detectable in upper and lower respiratory specimens during the acute phase of infection. Negative results do not preclude SARS-CoV-2 infection, do not rule out co-infections with other pathogens, and should not be used as the sole basis for treatment or other patient management decisions. Negative results must be combined with clinical observations, patient history, and epidemiological information. The expected result is Negative.  Fact Sheet for Patients: SugarRoll.be  Fact Sheet for Healthcare Providers: https://www.woods-mathews.com/  This test is not yet approved or cleared by the [place] FDA and  has been authorized for detection and/or diagnosis of SARS-CoV-2 by FDA under an Emergency Use Authorization (EUA). This EUA will remain  in effect (meaning this test can be used) for the duration of the COVID-19  declaration under Se ction 564(b)(1) of the Act, 21 U.S.C. section 360bbb-3(b)(1), unless the authorization is terminated or revoked sooner.  Performed at [facility], [place] [address], [place], [place] [address]          Radiology Studies: MR FOOT RIGHT WO CONTRAST  Result Date: 11/08/2020 CLINICAL DATA:  Diabetic foot ulcer.  Concern for osteomyelitis. EXAM: MRI OF THE RIGHT FOREFOOT WITHOUT CONTRAST TECHNIQUE: Multiplanar, multisequence MR imaging of the right forefoot was performed. No intravenous contrast was administered. COMPARISON:  Radiographs 11/07/2020.  MRI 08/02/2020. FINDINGS: Bones/Joint/Cartilage There is no evidence bone marrow or cortical destruction. No erosive changes or significant joint effusions. Mild degenerative changes at 1st metatarsophalangeal joint. The alignment is normal at the Lisfranc joint. Ligaments The Lisfranc ligament is intact. The collateral ligaments of the metatarsophalangeal joints are intact. 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 No focal fluid collection, tendon tear or significant tenosynovitis. Soft tissues There is mild soft tissue ulceration along the plantar and lateral aspect of the distal great toe. No drainable fluid collection is apparent in this area. No evidence of adjacent osteomyelitis. Dorsal subcutaneous edema has improved compared with prior study. IMPRESSION: 1. Focal soft tissue ulceration in the plantar and lateral aspect of the great toe without evidence of underlying focal abscess. 2. Improved nonspecific dorsal subcutaneous edema. 3. No evidence of osteomyelitis or septic joint. Electronically Signed   By: Richardean Sale M.D.   On: 11/08/2020 07:12   DG Foot Complete Right  Result Date: 11/07/2020 CLINICAL DATA:  Great toe infection, concern for osteomyelitis EXAM: RIGHT FOOT COMPLETE - 3+ VIEW COMPARISON:  MR foot, 08/02/2020 FINDINGS: No fracture or dislocation of the  right foot. There is subtle bony lucency of the lateral aspect of the tuft of the left great toe distal phalanx without overt cortical disruption. Overlying soft tissue ulceration. Diffuse soft tissue edema about the forefoot. IMPRESSION: 1. There is subtle bony lucency of the lateral aspect of the tuft of the left great toe distal phalanx without overt cortical disruption. Overlying soft tissue ulceration. Findings are suspicious for osteomyelitis. 2.  Diffuse soft tissue edema about the forefoot. Electronically Signed   By: Eddie Candle M.D.   On: 11/07/2020 14:42        Scheduled Meds:  amiodarone  200 mg Oral Daily   atorvastatin  80 mg Oral Daily   carvedilol  6.25 mg Oral BID WC   insulin aspart  0-9 Units Subcutaneous TID WC   insulin aspart protamine- aspart  15 Units Subcutaneous Q supper   insulin aspart protamine- aspart  30 Units Subcutaneous Q breakfast  losartan  12.5 mg Oral Daily   Warfarin - Pharmacist Dosing Inpatient   Does not apply q1600   Continuous Infusions:  ceFEPime (MAXIPIME) IV 2 g (11/08/20 0606)   vancomycin            Aline August, MD [facility] 11/08/2020, 10:45 AM

## 2020-11-08 NOTE — Consult Note (Signed)
[facility] Nurse wound consult note Consultation was completed by review of records, images and assistance from the bedside nurse/clinical staff.   Reason for Consult:right great toe and 2nd toe wounds Patient is followed by [facility] s/p R SFA/BK in April. Girlfriend found maggots in the wound when caring for it. Patient with known insensate foot. History of LLE amputation as well.  Wound type: arterial  Pressure Injury POA: Yes/No/NA Measurement:see nursing flow sheet Wound bed: Right great toe; plantar surface; full thickness ulceration with 95% black tissue/5% pink; second full thickness ulceration at the tip of the right great toe as well 100% pink Right second toe; full thickness ulceration; 50% fibrinous/50% pink Drainage (amount, consistency, odor) documented as purulent; maggots found in right great toe wound in the ED; foul smelling Periwound: erythema  Imaging xray suspect for osteomyelitis; MRI negative   Based on history of patient with PAD and DM would recommend consultation with [facility] or orthopedics patient may need surgical intervention.   Dressing procedure/placement/frequency:  Clean wounds with saline, pat dry. Clean foot well between webspaces. Apply single layer of xeroform to the wounds, antibacterial protection and non adherent, wounds are not deep enough to need packing. Top with dry dressings.   Follow up with [facility] as scheduled outpatient.    Re consult if needed, will not follow at this time. Thanks  Dariah Mcsorley [employer], RN,CWOCN, CNS, [place] [phone])

## 2020-11-08 NOTE — Progress Notes (Addendum)
[place] for Warfarin Indication: atrial fibrillation  No Known Allergies  Patient Measurements: Height: 6' (182.9 cm) Weight: 100 kg (220 lb 7.4 oz) IBW/kg (Calculated) : 77.6  Vital Signs: Temp: 98.8 F (37.1 C) (07/15 0555) Temp Source: Oral (07/15 0555) BP: 153/72 (07/15 0902) Pulse Rate: 77 (07/15 0902)  Labs: Recent Labs    11/07/20 1436 11/07/20 1832 11/08/20 0442  HGB 11.1*  --  9.9*  HCT 34.4*  --  31.1*  PLT 326  --  274  LABPROT  --  21.4* 34.4*  INR  --  1.9* 3.4*  CREATININE 1.11  --  1.17    Estimated Creatinine Clearance: 74 mL/min (by C-G formula based on SCr of 1.17 mg/dL).  Assessment:  28 yoM with R great toe infection x 2 months, Warfarin 5mg  qd for Afib, LD 7/13, recent DVT 08/14/20. Pharmacy to dose Warfarin Baseline H/H low, Plt wnl Baseline INR 1.9  11/08/2020 INR 3.4> slightly above goal Hg 11.1> 9.9, PLT WNL No bleeding reported flagyl added per MD.  Watch out for warfarin DDI if/when resumed   Goal of Therapy:  INR 2-3 Monitor platelets by anticoagulation protocol: Yes   Plan:  Holding coumadin today per MD request due to possible toe amputation & elevated INR Daily Protime/INR Monitor CBC, s/s bleeding flagyl added per MD.  Watch out for warfarin DDI if/when resumed  Eudelia Bunch, Pharm.D 11/08/2020 11:01 AM

## 2020-11-09 ENCOUNTER — Encounter (HOSPITAL_COMMUNITY): Payer: Medicare PPO

## 2020-11-09 DIAGNOSIS — M869 Osteomyelitis, unspecified: Secondary | ICD-10-CM | POA: Diagnosis not present

## 2020-11-09 DIAGNOSIS — I10 Essential (primary) hypertension: Secondary | ICD-10-CM | POA: Diagnosis not present

## 2020-11-09 DIAGNOSIS — I96 Gangrene, not elsewhere classified: Secondary | ICD-10-CM

## 2020-11-09 DIAGNOSIS — I5020 Unspecified systolic (congestive) heart failure: Secondary | ICD-10-CM | POA: Diagnosis not present

## 2020-11-09 DIAGNOSIS — I739 Peripheral vascular disease, unspecified: Secondary | ICD-10-CM

## 2020-11-09 DIAGNOSIS — I480 Paroxysmal atrial fibrillation: Secondary | ICD-10-CM | POA: Diagnosis not present

## 2020-11-09 LAB — BASIC METABOLIC PANEL
Anion gap: 7 (ref 5–15)
BUN: 22 mg/dL (ref 8–23)
CO2: 24 mmol/L (ref 22–32)
Calcium: 8.7 mg/dL — ABNORMAL LOW (ref 8.9–10.3)
Chloride: 106 mmol/L (ref 98–111)
Creatinine, Ser: 1.11 mg/dL (ref 0.61–1.24)
GFR, Estimated: >60 mL/min (ref >60)
Glucose, Bld: 112 mg/dL — ABNORMAL HIGH (ref 70–99)
Potassium: 4.3 mmol/L (ref 3.5–5.1)
Sodium: 137 mmol/L (ref 135–145)

## 2020-11-09 LAB — PROTIME-INR
INR: 1.6 — ABNORMAL HIGH (ref 0.8–1.2)
Prothrombin Time: 18.5 seconds — ABNORMAL HIGH (ref 11.4–15.2)

## 2020-11-09 LAB — CBC WITH DIFFERENTIAL/PLATELET
Abs Immature Granulocytes: 0.02 10*3/uL (ref 0.00–0.07)
Basophils Absolute: 0.1 10*3/uL (ref 0.0–0.1)
Basophils Relative: 1 %
Eosinophils Absolute: 0.2 10*3/uL (ref 0.0–0.5)
Eosinophils Relative: 3 %
HCT: 31.4 % — ABNORMAL LOW (ref 39.0–52.0)
Hemoglobin: 10 g/dL — ABNORMAL LOW (ref 13.0–17.0)
Immature Granulocytes: 0 %
Lymphocytes Relative: 15 %
Lymphs Abs: 1.3 10*3/uL (ref 0.7–4.0)
MCH: 29.7 pg (ref 26.0–34.0)
MCHC: 31.8 g/dL (ref 30.0–36.0)
MCV: 93.2 fL (ref 80.0–100.0)
Monocytes Absolute: 0.7 10*3/uL (ref 0.1–1.0)
Monocytes Relative: 8 %
Neutro Abs: 6.3 10*3/uL (ref 1.7–7.7)
Neutrophils Relative %: 73 %
Platelets: 275 10*3/uL (ref 150–400)
RBC: 3.37 MIL/uL — ABNORMAL LOW (ref 4.22–5.81)
RDW: 14.1 % (ref 11.5–15.5)
WBC: 8.6 10*3/uL (ref 4.0–10.5)
nRBC: 0 % (ref 0.0–0.2)

## 2020-11-09 LAB — MAGNESIUM: Magnesium: 1.8 mg/dL (ref 1.7–2.4)

## 2020-11-09 LAB — GLUCOSE, CAPILLARY
Glucose-Capillary: 117 mg/dL — ABNORMAL HIGH (ref 70–99)
Glucose-Capillary: 163 mg/dL — ABNORMAL HIGH (ref 70–99)
Glucose-Capillary: 95 mg/dL (ref 70–99)
Glucose-Capillary: 92 mg/dL (ref 70–99)
Glucose-Capillary: 121 mg/dL — ABNORMAL HIGH (ref 70–99)

## 2020-11-09 LAB — HEPARIN LEVEL (UNFRACTIONATED): Heparin Unfractionated: 0.17 IU/mL — ABNORMAL LOW (ref 0.30–0.70)

## 2020-11-09 MED ORDER — HEPARIN (PORCINE) 25000 UT/250ML-% IV SOLN
1800.0000 [IU]/h | INTRAVENOUS | Status: DC
  Administered 2020-11-09: 1500 [IU]/h via INTRAVENOUS
  Administered 2020-11-10 – 2020-11-13 (×6): 1800 [IU]/h via INTRAVENOUS
  Filled 2020-11-09 (×8): qty 250

## 2020-11-09 MED ORDER — IPRATROPIUM-ALBUTEROL 0.5-2.5 (3) MG/3ML IN SOLN
3.0000 mL | RESPIRATORY_TRACT | Status: DC | PRN
  Administered 2020-11-11 – 2020-11-17 (×20): 3 mL via RESPIRATORY_TRACT
  Filled 2020-11-09: qty 39
  Filled 2020-11-09 (×20): qty 3

## 2020-11-09 NOTE — Progress Notes (Signed)
I was called to evaluate Rodney [name] given worsening right great toe tissue loss with maggot infestation.  Orthopedic surgery concerned that toe amputation may not heal given his PVD.  He is status post right common iliac stent on 08/05/2020 and then right SFA to below-knee popliteal bypass with vein by Dr. Donzetta Matters on 08/07/2020.  I recommended transfer to [facility] given we do not operate at [facility].  We will get noninvasive imaging when he arrives to [facility] and have ordered ABIs and arterial duplex of right leg.  Marty Heck, MD [facility] Office: [place]

## 2020-11-09 NOTE — Progress Notes (Signed)
[place] for Warfarin>heparin Indication: atrial fibrillation  No Known Allergies  Patient Measurements: Height: 6' (182.9 cm) Weight: 101.6 kg (223 lb 14.4 oz) IBW/kg (Calculated) : 77.6  Vital Signs: Temp: 97.7 F (36.5 C) (07/16 1958) Temp Source: Oral (07/16 1958) BP: 126/61 (07/16 1958) Pulse Rate: 63 (07/16 1958)  Labs: Recent Labs    11/07/20 1436 11/07/20 1832 11/08/20 0442 11/09/20 0525  HGB 11.1*  --  9.9* 10.0*  HCT 34.4*  --  31.1* 31.4*  PLT 326  --  274 275  LABPROT  --  21.4* 34.4* 18.5*  INR  --  1.9* 3.4* 1.6*  CREATININE 1.11  --  1.17 1.11    Estimated Creatinine Clearance: 78.6 mL/min (by C-G formula based on SCr of 1.11 mg/dL).  Assessment:  72 yoM with R great toe infection x 2 months, Warfarin 5mg  qd for Afib, LD 7/13, recent DVT 08/14/20. Baseline INR 1.9  INR 3.4>1.6 - warfarin on hold.     Heparin level is subtherapeutic at 0.17, on 1500 units/hr (didn't report in Epic but resulted in lab report as shown above). Hgb 10, plt 275. No s/sx of bleeding or infusion issues.   Goal of Therapy:  Heparin level: 0.3-0.7 INR 2-3 Monitor platelets by anticoagulation protocol: Yes   Plan:  Increase heparin infusion to 1800 units/hr  Order heparin level in 6 hours with AM labs Monitor daily HL, CBC, and for s/sx of bleeding  Antonietta Jewel, PharmD, [facility] Pharmacist  Phone: [phone] 11/09/2020 9:56 PM  Please check AMION for all [place] phone numbers After 10:00 PM, call [place] [phone]

## 2020-11-09 NOTE — Progress Notes (Addendum)
Pharmacy: warfarin  Patient's a 70 y.o M with hx DVT/afib on warfarin PTA presented to the ED on 7/14 for evaluation of right great toe wound.  Plan is for possible toe amputation.  Warfarin was held on 7/15 in anticipation for procedure.  Plan: - will continue to hold warfarin for now - please advise of/when you want [place] to resume warfarin back for pt.  Dia Sitter, PharmD, BCPS 11/09/2020 11:09 AM  __________________________________ Adden: Start heparin drip while warfarin is on hold per Dr. Lindalou Hose request. - INR 1.6 - start heparin drip at 1500 units/hr - check 6 hr heparin level  Dia Sitter, PharmD, BCPS 11/09/2020 11:36 AM

## 2020-11-09 NOTE — Consult Note (Signed)
Hospital Consult    Reason for Consult: Ability to heal right great toe amputation Referring Physician: Hospitalist MRN #:  [id number]  History of Present Illness: This is a 70 y.o. male with multiple medical problems including coronary artery disease, diabetes, hypertension, A. fib, history of left BKA, peripheral arterial disease that vascular surgery has been asked to evaluate for ability to heal right big toe amputation.  Patient initially underwent right common iliac stent on 08/05/2020.  He then underwent right SFA to below-knee pop bypass with vein by Dr. Donzetta Matters on 08/07/2020.  Was admitted at [facility] after he noted maggots in his big toe about 3 to 4 days ago.  He was seen by orthopedics and they asked vascular to provide added input on ability to heal a toe amputation.  Patient is fairly against a toe amputation.  States his foot feels fine.  Past Medical History:  Diagnosis Date   CHF (congestive heart failure) (HCC)    Coronary artery disease    Diabetes mellitus without complication (HCC)    HLD (hyperlipidemia)    Hypertension    Peripheral edema 02/24/2018   SOB (shortness of breath) 07/26/2018    Past Surgical History:  Procedure Laterality Date   ABDOMINAL AORTOGRAM W/LOWER EXTREMITY N/A 08/05/2020   Procedure: ABDOMINAL AORTOGRAM W/LOWER EXTREMITY;  Surgeon: Marty Heck, MD;  Location: [place] [facility];  Service: Cardiovascular;  Laterality: N/A;   AMPUTATION Left 09/28/2019   Procedure: AMPUTATION BELOW KNEE;  Surgeon: Newt Minion, MD;  Location: [place];  Service: Orthopedics;  Laterality: Left;   CARDIAC CATHETERIZATION     CARDIOVERSION N/A 10/05/2019   Procedure: CARDIOVERSION;  Surgeon: Sanda Klein, MD;  Location: [place] [facility];  Service: Cardiovascular;  Laterality: N/A;   FEMORAL-POPLITEAL BYPASS GRAFT Right 08/07/2020   Procedure: RIGHT FEMORAL TO BELOW KNEE POPLITEAL ARTERY BYPASS;  Surgeon: Waynetta Sandy, MD;  Location: [place];  Service:  Vascular;  Laterality: Right;   LEFT HEART CATH AND CORONARY ANGIOGRAPHY N/A 10/03/2019   Procedure: LEFT HEART CATH AND CORONARY ANGIOGRAPHY;  Surgeon: Lorretta Harp, MD;  Location: [place] [facility];  Service: Cardiovascular;  Laterality: N/A;   PERIPHERAL VASCULAR INTERVENTION Right 08/05/2020   Procedure: PERIPHERAL VASCULAR INTERVENTION;  Surgeon: Marty Heck, MD;  Location: [place] [facility];  Service: Cardiovascular;  Laterality: Right;  common Iliac   TEE WITHOUT CARDIOVERSION N/A 10/05/2019   Procedure: TRANSESOPHAGEAL ECHOCARDIOGRAM (TEE);  Surgeon: Sanda Klein, MD;  Location: [facility];  Service: Cardiovascular;  Laterality: N/A;    No Known Allergies  Prior to Admission medications   Medication Sig Start Date End Date Taking? Authorizing Provider  acetaminophen (TYLENOL) 500 MG tablet Take 1,000 mg by mouth every 6 (six) hours as needed for moderate pain.   Yes [provider]  amiodarone (PACERONE) 200 MG tablet Take 1 tablet (200 mg total) by mouth daily. 10/14/20  Yes Hensel, Jamal Collin, MD  atorvastatin (LIPITOR) 80 MG tablet TAKE 1 TABLET EVERY DAY Patient taking differently: Take 80 mg by mouth daily. 03/25/20  Yes O'Neal, Cassie Freer, MD  carvedilol (COREG) 12.5 MG tablet Take 0.5 tablets (6.25 mg total) by mouth 2 (two) times daily with a meal. 10/14/20  Yes Hensel, Jamal Collin, MD  furosemide (LASIX) 40 MG tablet Take 1 tablet (40 mg total) by mouth daily as needed for fluid (swelling). 10/14/20  Yes Hensel, Jamal Collin, MD  insulin NPH-regular Human (NOVOLIN 70/30 RELION) (70-30) 100 UNIT/ML injection Inject 15-30 Units  into the skin See admin instructions. Inject 30 units into the skin with breakfast and 15 units with supper   Yes [provider]  losartan (COZAAR) 25 MG tablet Take 1/2 tablet (12.5 mg total) by mouth daily. 08/20/20  Yes Welborn, Ryan, DO  metFORMIN (GLUCOPHAGE) 1000 MG tablet Take 1 tablet (1,000 mg total) by mouth daily.  09/18/20  Yes Hensel, Jamal Collin, MD  warfarin (COUMADIN) 5 MG tablet Take 1 tablet (5 mg total) by mouth daily. 08/19/20  Yes Welborn, Ryan, DO  Blood Glucose Monitoring Suppl (TRUE METRIX METER) DEVI Use to test blood sugar three times daily. 11/20/19   [name], MD  Blood Glucose Monitoring Suppl (TRUE METRIX METER) w/Device KIT USE AS DIRECTED 03/25/20   Hensel, Jamal Collin, MD  glucose blood (RELION TRUE METRIX TEST STRIPS) test strip Use to test blood sugar three times per day. 11/20/19   [name], MD  multivitamin (RENA-VIT) TABS tablet Take 1 tablet by mouth at bedtime. Patient not taking: Reported on 11/07/2020 04/09/20   Lattie Haw, MD  TRUEplus Lancets 33G MISC Use to test blood sugar three times per day. 11/20/19   [name], MD    Social History   Socioeconomic History   Marital status: Widowed    Spouse name: Not on file   Number of children: Not on file   Years of education: Not on file   Highest education level: Not on file  Occupational History   Not on file  Tobacco Use   Smoking status: Every Day    Packs/day: 0.50    Years: 50.00    Pack years: 25.00    Types: Cigarettes   Smokeless tobacco: Never  Vaping Use   Vaping Use: Never used  Substance and Sexual Activity   Alcohol use: Yes    Alcohol/week: 6.0 standard drinks    Types: 6 Standard drinks or equivalent per week    Comment: 11/07/20 - states he has not drank in 6 months   Drug use: No   Sexual activity: Yes    Partners: Female    Comment: monagamous stable relationship  Other Topics Concern   Not on file  Social History Narrative   Not on file   Social Determinants of Health   Financial Resource Strain: Not on file  Food Insecurity: Not on file  Transportation Needs: Not on file  Physical Activity: Not on file  Stress: Not on file  Social Connections: Not on file  Intimate Partner Violence: Not on file     Family History  Problem Relation Age of Onset   Alcoholism  Mother    Alcoholism Father     ROS: _0  Positive   _1  Negative   _2  All sytems reviewed and are negative  Cardiovascular: _3  chest pain/pressure _4  palpitations _5  SOB lying flat _6  DOE _7  pain in legs while walking _8  pain in legs at rest _9  pain in legs at night _10  non-healing ulcers _11  hx of DVT _12  swelling in legs  Pulmonary: _13  productive cough _14  asthma/wheezing _15  home O2  Neurologic: _16  weakness in _17  arms _18  legs _19  numbness in _20  arms _21  legs _22  hx of CVA _23  mini stroke _24 difficulty speaking or slurred speech _25  temporary loss of vision in one eye _26  dizziness  Hematologic: _27  hx of cancer _28  bleeding problems _29  problems with blood clotting easily  Endocrine:   _30  diabetes _31  thyroid disease  GI _32  vomiting blood _33  blood  in stool  GU: _0  CKD/renal failure _1  HD--_2  M/W/F or _3  T/T/S _4  burning with urination _5  blood in urine  Psychiatric: _6  anxiety _7  depression  Musculoskeletal: _8  arthritis _9  joint pain  Integumentary: _10  rashes _11  ulcers  Constitutional: _12  fever _13  chills   Physical Examination  Vitals:   11/09/20 1754 11/09/20 1958  BP: (!) 146/66 126/61  Pulse: 66 63  Resp: 17 18  Temp: 98.2 F (36.8 C) 97.7 F (36.5 C)  SpO2: 96% 99%   Body mass index is 30.37 kg/m.  General:  WDWN in NAD Gait: Not observed HENT: WNL, normocephalic Pulmonary: normal non-labored breathing, without Rales, rhonchi,  wheezing Cardiac: regular, without  Murmurs, rubs or gallops Abdomen: soft, NT/ND, no masses Vascular Exam/Pulses: Right femoral pulse palpable Right AT and PT signals appear monophasic Open wounds to the right big toe and second toe Musculoskeletal: no muscle wasting or atrophy  Neurologic: A&O X 3; Appropriate Affect ; SENSATION: normal; MOTOR FUNCTION:  moving all extremities equally. Speech is fluent/normal     CBC    Component Value Date/Time   WBC 8.6 11/09/2020 0525   RBC 3.37 (L) 11/09/2020 0525    HGB 10.0 (L) 11/09/2020 0525   HGB 10.7 (L) 08/29/2020 1501   HCT 31.4 (L) 11/09/2020 0525   HCT 33.0 (L) 08/29/2020 1501   PLT 275 11/09/2020 0525   PLT 371 08/29/2020 1501   MCV 93.2 11/09/2020 0525   MCV 89 08/29/2020 1501   MCH 29.7 11/09/2020 0525   MCHC 31.8 11/09/2020 0525   RDW 14.1 11/09/2020 0525   RDW 13.9 08/29/2020 1501   LYMPHSABS 1.3 11/09/2020 0525   MONOABS 0.7 11/09/2020 0525   EOSABS 0.2 11/09/2020 0525   BASOSABS 0.1 11/09/2020 0525    BMET    Component Value Date/Time   NA 137 11/09/2020 0525   NA 136 09/05/2020 1442   K 4.3 11/09/2020 0525   CL 106 11/09/2020 0525   CO2 24 11/09/2020 0525   GLUCOSE 112 (H) 11/09/2020 0525   BUN 22 11/09/2020 0525   BUN 29 (H) 09/05/2020 1442   CREATININE 1.11 11/09/2020 0525   CREATININE 1.19 10/09/2015 1554   CALCIUM 8.7 (L) 11/09/2020 0525   GFRNONAA >60 11/09/2020 0525   GFRNONAA 65 10/09/2015 1554   GFRAA 52 (L) 05/01/2020 1632   GFRAA 75 10/09/2015 1554    COAGS: Lab Results  Component Value Date   INR 1.6 (H) 11/09/2020   INR 3.4 (H) 11/08/2020   INR 1.9 (H) 11/07/2020     Non-Invasive Vascular Imaging:    ABI and right lower extremity arterial duplex pending  ASSESSMENT/PLAN: This is a 70 y.o. male that was admitted to [facility] long hospital with worsening right great toe tissue loss and reported maggots in the wound.  Vascular surgery was asked to provide input on ability to heal a right great toe amputation after he was seen by orthopedics.  He is status post right common iliac artery angioplasty/stent and a right SFA to below-knee popliteal bypass earlier this year.  I have ordered ABIs and noninvasive right lower extremity arterial duplex.  We will follow.  Marty Heck, MD [facility] Office: [place]

## 2020-11-09 NOTE — Progress Notes (Signed)
Patient ID: Rodney [name], male   DOB: 1950-11-13, 70 y.o.   MRN: [id number]  PROGRESS NOTE    AUTHER LYERLY  VOH:[id number] DOB: December 06, 1950 DOA: 11/07/2020 PCP: [name], MD   Brief Narrative:  70 year old male with history of peripheral vascular disease status post femoropopliteal bypass in April 2022 following which patient was also admitted the same month for cellulitis of right lower extremity treated with antibiotics, left BKA for necrotizing fasciitis in 2021, CAD, diabetes mellitus type 2, hypertension, paroxysmal A. fib on Coumadin, right lower extremity DVT diagnosed in April 2022 presented with right great toe worsening wound with maggots.  On presentation, x-ray revealed possible right great toe osteomyelitis.  He was started on IV antibiotics.  Orthopedics and subsequently vascular surgery were consulted.  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  MRI of right foot showed soft tissue ulceration in the plantar and lateral aspect of the right great toe without underlying focal abscess with no evidence of osteomyelitis or septic joint. -Currently on cefepime, Flagyl and vancomycin.   -Orthopedics following and recommended vascular surgery consultation.  Spoke to Dr. Carlis Abbott on 11/08/2020 and he recommended that patient be transferred to [facility] for vascular surgery evaluation.  ABI in right lower extremity arterial duplex pending.  Currently pending transfer to [facility]. -Currently afebrile with no leukocytosis.  Paroxysmal A. fib on Coumadin Supratherapeutic INR: Resolved History of right lower extremity DVT diagnosed in April 2022 -INR 1.6 today.  Hold Coumadin.  Start heparin drip in case patient needs surgical intervention. -Rate controlled.  Continue amiodarone.  Outpatient follow-up with cardiology  Peripheral vascular  disease status post femoropopliteal bypass -Plan as above  Diabetes mellitus type 2 -Continue 70/30 insulin along with CBGs with SSI  Chronic kidney disease stage IIIa -Creatinine stable.  Monitor  Anemia of chronic disease -Possibly from kidney disease.  Hemoglobin stable.  Monitor  CAD -Continue Coreg, statin and losartan.  Outpatient follow-up with cardiology  Hypertension -Continue Coreg, losartan  Hyperlipidemia -Continue statin  DVT prophylaxis: Start heparin drip Code Status: Full Family Communication: None at bedside Disposition Plan: Status is: Inpatient  Remains inpatient appropriate because:Inpatient level of care appropriate due to severity of illness  Dispo: The patient is from: Home              Anticipated d/c is to: Home              Patient currently is not medically stable to d/c.   Difficult to place patient No  Consultants: Orthopedics/vascular surgery  Procedures: None  Antimicrobials:  Cefepime and vancomycin from 11/07/2020 onwards.  Flagyl from 11/07/2020 onwards  Subjective: Patient seen and examined at bedside.  Denies worsening shortness of breath, foot pain, nausea, vomiting or fever. Objective: Vitals:   11/08/20 1348 11/08/20 1728 11/08/20 2030 11/09/20 0500  BP: (!) 103/92 129/73 (!) 148/73   Pulse: 65 70 64   Resp: 17  20   Temp: 98.4 F (36.9 C)  98.6 F (37 C)   TempSrc: Oral  Oral   SpO2: 98%  99%   Weight:    101.6 kg  Height:        Intake/Output Summary (Last 24 hours) at 11/09/2020 0809 Last data filed at 11/09/2020 0622 Gross per 24 hour  Intake 1310.03 ml  Output 1900 ml  Net -589.97 ml    Danley Danker  Weights   11/07/20 2211 11/09/20 0500  Weight: 100 kg 101.6 kg    Examination:  General exam: No distress.  Currently on room air. Respiratory system: Decreased bilateral breath sounds at bases  cardiovascular system: Rate controlled, S1-S2 heard  gastrointestinal system: Abdomen is distended slightly, soft and  nontender.  Bowel sounds are heard Extremities: Trace lower extremity edema; no clubbing  Central nervous system: Awake and alert.  No focal neurological deficits.  Moves extremities  skin: Right foot dressing present; no obvious ecchymosis Psychiatry: Affect is mostly flat   Data Reviewed: I have personally reviewed following labs and imaging studies  CBC: Recent Labs  Lab 11/07/20 1436 11/08/20 0442 11/09/20 0525  WBC 9.4 7.7 8.6  NEUTROABS 6.2  --  6.3  HGB 11.1* 9.9* 10.0*  HCT 34.4* 31.1* 31.4*  MCV 92.2 94.5 93.2  PLT 326 274 423    Basic Metabolic Panel: Recent Labs  Lab 11/07/20 1436 11/08/20 0442 11/09/20 0525  NA 135 135 137  K 4.1 4.2 4.3  CL 103 103 106  CO2 26 27 24   GLUCOSE 109* 122* 112*  BUN 25* 21 22  CREATININE 1.11 1.17 1.11  CALCIUM 8.9 8.7* 8.7*  MG  --   --  1.8    GFR: Estimated Creatinine Clearance: 78.6 mL/min (by C-G formula based on SCr of 1.11 mg/dL). Liver Function Tests: Recent Labs  Lab 11/07/20 1436  AST 13*  ALT 13  ALKPHOS 70  BILITOT 0.6  PROT 7.3  ALBUMIN 3.5    No results for input(s): LIPASE, AMYLASE in the last 168 hours. No results for input(s): AMMONIA in the last 168 hours. Coagulation Profile: Recent Labs  Lab 11/07/20 1832 11/08/20 0442 11/09/20 0525  INR 1.9* 3.4* 1.6*    Cardiac Enzymes: No results for input(s): CKTOTAL, CKMB, CKMBINDEX, TROPONINI in the last 168 hours. BNP (last 3 results) No results for input(s): PROBNP in the last 8760 hours. HbA1C: No results for input(s): HGBA1C in the last 72 hours. CBG: Recent Labs  Lab 11/08/20 0726 11/08/20 1119 11/08/20 1545 11/08/20 2033 11/09/20 0730  GLUCAP 122* 130* 132* 120* 117*    Lipid Profile: No results for input(s): CHOL, HDL, LDLCALC, TRIG, CHOLHDL, LDLDIRECT in the last 72 hours. Thyroid Function Tests: No results for input(s): TSH, T4TOTAL, FREET4, T3FREE, THYROIDAB in the last 72 hours. Anemia Panel: No results for input(s):  VITAMINB12, FOLATE, FERRITIN, TIBC, IRON, RETICCTPCT in the last 72 hours. Sepsis Labs: Recent Labs  Lab 11/07/20 1436  LATICACIDVEN 0.8     Recent Results (from the past 240 hour(s))  Blood culture (routine x 2)     Status: None (Preliminary result)   Collection Time: 11/07/20  2:36 PM   Specimen: BLOOD  Result Value Ref Range Status   Specimen Description BLOOD BLOOD RIGHT FOREARM  Final   Special Requests   Final    BOTTLES DRAWN AEROBIC AND ANAEROBIC Blood Culture adequate volume   Culture   Final    NO GROWTH < 24 HOURS Performed at [facility], [place] [address] [place], [place] [address]    Report Status PENDING  Incomplete  Blood culture (routine x 2)     Status: None (Preliminary result)   Collection Time: 11/07/20  2:36 PM   Specimen: BLOOD RIGHT HAND  Result Value Ref Range Status   Specimen Description BLOOD RIGHT HAND  Final   Special Requests   Final    BOTTLES DRAWN AEROBIC AND ANAEROBIC Blood Culture  adequate volume   Culture   Final    NO GROWTH < 12 HOURS Performed at [place] [address], [place], [place] [address]    Report Status PENDING  Incomplete  SARS CORONAVIRUS 2 (TAT 6-24 HRS) Nasopharyngeal Nasopharyngeal Swab     Status: None   Collection Time: 11/07/20  4:24 PM   Specimen: Nasopharyngeal Swab  Result Value Ref Range Status   SARS Coronavirus 2 NEGATIVE NEGATIVE Final    Comment: (NOTE) SARS-CoV-2 target nucleic acids are NOT DETECTED.  The SARS-CoV-2 RNA is generally detectable in upper and lower respiratory specimens during the acute phase of infection. Negative results do not preclude SARS-CoV-2 infection, do not rule out co-infections with other pathogens, and should not be used as the sole basis for treatment or other patient management decisions. Negative results must be combined with clinical observations, patient history, and epidemiological information. The expected result is Negative.  Fact Sheet for  Patients: SugarRoll.be  Fact Sheet for Healthcare Providers: https://www.woods-mathews.com/  This test is not yet approved or cleared by the [place] FDA and  has been authorized for detection and/or diagnosis of SARS-CoV-2 by FDA under an Emergency Use Authorization (EUA). This EUA will remain  in effect (meaning this test can be used) for the duration of the COVID-19 declaration under Se ction 564(b)(1) of the Act, 21 U.S.C. section 360bbb-3(b)(1), unless the authorization is terminated or revoked sooner.  Performed at [facility], [place] [address], [place], [place] [address]           Radiology Studies: MR FOOT RIGHT WO CONTRAST  Result Date: 11/08/2020 CLINICAL DATA:  Diabetic foot ulcer.  Concern for osteomyelitis. EXAM: MRI OF THE RIGHT FOREFOOT WITHOUT CONTRAST TECHNIQUE: Multiplanar, multisequence MR imaging of the right forefoot was performed. No intravenous contrast was administered. COMPARISON:  Radiographs 11/07/2020.  MRI 08/02/2020. FINDINGS: Bones/Joint/Cartilage There is no evidence bone marrow or cortical destruction. No erosive changes or significant joint effusions. Mild degenerative changes at 1st metatarsophalangeal joint. The alignment is normal at the Lisfranc joint. Ligaments The Lisfranc ligament is intact. The collateral ligaments of the metatarsophalangeal joints are intact. 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 No focal fluid collection, tendon tear or significant tenosynovitis. Soft tissues There is mild soft tissue ulceration along the plantar and lateral aspect of the distal great toe. No drainable fluid collection is apparent in this area. No evidence of adjacent osteomyelitis. Dorsal subcutaneous edema has improved compared with prior study. IMPRESSION: 1. Focal soft tissue ulceration in the plantar and lateral aspect of the great toe without evidence of  underlying focal abscess. 2. Improved nonspecific dorsal subcutaneous edema. 3. No evidence of osteomyelitis or septic joint. Electronically Signed   By: Richardean Sale M.D.   On: 11/08/2020 07:12   DG Foot Complete Right  Result Date: 11/07/2020 CLINICAL DATA:  Great toe infection, concern for osteomyelitis EXAM: RIGHT FOOT COMPLETE - 3+ VIEW COMPARISON:  MR foot, 08/02/2020 FINDINGS: No fracture or dislocation of the right foot. There is subtle bony lucency of the lateral aspect of the tuft of the left great toe distal phalanx without overt cortical disruption. Overlying soft tissue ulceration. Diffuse soft tissue edema about the forefoot. IMPRESSION: 1. There is subtle bony lucency of the lateral aspect of the tuft of the left great toe distal phalanx without overt cortical disruption. Overlying soft tissue ulceration. Findings are suspicious for osteomyelitis. 2.  Diffuse soft tissue edema about the  forefoot. Electronically Signed   By: Eddie Candle M.D.   On: 11/07/2020 14:42        Scheduled Meds:  amiodarone  200 mg Oral Daily   atorvastatin  80 mg Oral Daily   carvedilol  6.25 mg Oral BID WC   insulin aspart  0-9 Units Subcutaneous TID WC   insulin aspart protamine- aspart  15 Units Subcutaneous Q supper   insulin aspart protamine- aspart  30 Units Subcutaneous Q breakfast   losartan  12.5 mg Oral Daily   multivitamin with minerals  1 tablet Oral Daily   nutrition supplement (JUVEN)  1 packet Oral BID BM   Ensure Max Protein  11 oz Oral BID   Warfarin - Pharmacist Dosing Inpatient   Does not apply q1600   Continuous Infusions:  ceFEPime (MAXIPIME) IV 2 g (11/09/20 0601)   metronidazole 500 mg (11/09/20 0353)   vancomycin 1,500 mg (11/08/20 1636)          Aline August, MD [facility] 11/09/2020, 8:09 AM

## 2020-11-09 NOTE — Progress Notes (Signed)
Pt being transferred to [place] room 17 via [place]. Report given to [place], Therapist, sports. RN addressed pt questions or concerns. Pt being transferred with Heparin Drip running at 15/hr and IV Vancomycin.

## 2020-11-09 NOTE — Progress Notes (Signed)
RN called report to [facility]. Report given to [place], Therapist, sports. Informed RN to page Dr. Carlis Abbott with Vascular Surgery when pt arrives to [facility].

## 2020-11-09 NOTE — Progress Notes (Signed)
Pt roomed to unit as a transfer from [facility] for vascular surgery evaluation. Vascular surgeon notified of pt's arrival as requested. Dressing to right toe redressed as part of initial assessment. 30 maggots already removed from wound site over the last couple of days before pt was transferred. Heparin drip running at 47ml/hr .No concerns noted at this time

## 2020-11-10 ENCOUNTER — Inpatient Hospital Stay (HOSPITAL_COMMUNITY): Payer: Medicare PPO

## 2020-11-10 DIAGNOSIS — E1122 Type 2 diabetes mellitus with diabetic chronic kidney disease: Secondary | ICD-10-CM | POA: Diagnosis not present

## 2020-11-10 DIAGNOSIS — I739 Peripheral vascular disease, unspecified: Secondary | ICD-10-CM

## 2020-11-10 DIAGNOSIS — I96 Gangrene, not elsewhere classified: Secondary | ICD-10-CM | POA: Diagnosis not present

## 2020-11-10 DIAGNOSIS — N183 Chronic kidney disease, stage 3 unspecified: Secondary | ICD-10-CM | POA: Diagnosis not present

## 2020-11-10 DIAGNOSIS — L0390 Cellulitis, unspecified: Secondary | ICD-10-CM | POA: Diagnosis not present

## 2020-11-10 DIAGNOSIS — R791 Abnormal coagulation profile: Secondary | ICD-10-CM | POA: Diagnosis present

## 2020-11-10 DIAGNOSIS — M869 Osteomyelitis, unspecified: Secondary | ICD-10-CM | POA: Diagnosis not present

## 2020-11-10 DIAGNOSIS — Z86718 Personal history of other venous thrombosis and embolism: Secondary | ICD-10-CM

## 2020-11-10 LAB — GLUCOSE, CAPILLARY
Glucose-Capillary: 132 mg/dL — ABNORMAL HIGH (ref 70–99)
Glucose-Capillary: 115 mg/dL — ABNORMAL HIGH (ref 70–99)
Glucose-Capillary: 109 mg/dL — ABNORMAL HIGH (ref 70–99)
Glucose-Capillary: 106 mg/dL — ABNORMAL HIGH (ref 70–99)

## 2020-11-10 LAB — BASIC METABOLIC PANEL
Anion gap: 7 (ref 5–15)
BUN: 30 mg/dL — ABNORMAL HIGH (ref 8–23)
CO2: 24 mmol/L (ref 22–32)
Calcium: 8.6 mg/dL — ABNORMAL LOW (ref 8.9–10.3)
Chloride: 104 mmol/L (ref 98–111)
Creatinine, Ser: 1.16 mg/dL (ref 0.61–1.24)
GFR, Estimated: >60 mL/min (ref >60)
Glucose, Bld: 147 mg/dL — ABNORMAL HIGH (ref 70–99)
Potassium: 4.2 mmol/L (ref 3.5–5.1)
Sodium: 135 mmol/L (ref 135–145)

## 2020-11-10 LAB — C-REACTIVE PROTEIN: CRP: 3.6 mg/dL — ABNORMAL HIGH (ref <1.0)

## 2020-11-10 LAB — CBC WITH DIFFERENTIAL/PLATELET
Abs Immature Granulocytes: 0.04 10*3/uL (ref 0.00–0.07)
Basophils Absolute: 0.1 10*3/uL (ref 0.0–0.1)
Basophils Relative: 1 %
Eosinophils Absolute: 0.3 10*3/uL (ref 0.0–0.5)
Eosinophils Relative: 3 %
HCT: 28 % — ABNORMAL LOW (ref 39.0–52.0)
Hemoglobin: 9.1 g/dL — ABNORMAL LOW (ref 13.0–17.0)
Immature Granulocytes: 1 %
Lymphocytes Relative: 20 %
Lymphs Abs: 1.6 10*3/uL (ref 0.7–4.0)
MCH: 29.6 pg (ref 26.0–34.0)
MCHC: 32.5 g/dL (ref 30.0–36.0)
MCV: 91.2 fL (ref 80.0–100.0)
Monocytes Absolute: 0.8 10*3/uL (ref 0.1–1.0)
Monocytes Relative: 10 %
Neutro Abs: 5.3 10*3/uL (ref 1.7–7.7)
Neutrophils Relative %: 65 %
Platelets: 270 10*3/uL (ref 150–400)
RBC: 3.07 MIL/uL — ABNORMAL LOW (ref 4.22–5.81)
RDW: 13.9 % (ref 11.5–15.5)
WBC: 8 10*3/uL (ref 4.0–10.5)
nRBC: 0 % (ref 0.0–0.2)

## 2020-11-10 LAB — MAGNESIUM: Magnesium: 2 mg/dL (ref 1.7–2.4)

## 2020-11-10 LAB — PROTIME-INR
INR: 1.4 — ABNORMAL HIGH (ref 0.8–1.2)
Prothrombin Time: 16.8 seconds — ABNORMAL HIGH (ref 11.4–15.2)

## 2020-11-10 LAB — HEPARIN LEVEL (UNFRACTIONATED)
Heparin Unfractionated: 0.42 IU/mL (ref 0.30–0.70)
Heparin Unfractionated: 0.41 IU/mL (ref 0.30–0.70)

## 2020-11-10 LAB — VANCOMYCIN, PEAK: Vancomycin Pk: 50 ug/mL — ABNORMAL HIGH (ref 30–40)

## 2020-11-10 NOTE — Progress Notes (Signed)
VASCULAR LAB    Right lower extremity arterial duplex has been performed.  See CV proc for preliminary results.   Rhett Najera, RVT 11/10/2020, 10:36 AM

## 2020-11-10 NOTE — Progress Notes (Signed)
Pharmacy Antibiotic Note  Rodney [name] is a 70 y.o. male admitted on 11/07/2020 with cellulitis, R great toe infection x 2 months, maggots noted in wound by GF.  Pharmacy has been consulted for Vancomycin & Cefepime dosing.  Plan: Will check VP/VT to ensure patient adequately treated Continue Vanc 1500 mg IV q24h Continue Cefepime 2gm IV q8h Monitor renal fx, adjust abx as needed Vascular following, patient not amicable to amputation at this time Height: 6' (182.9 cm) Weight: 104.6 kg (230 lb 9.6 oz) IBW/kg (Calculated) : 77.6  Temp (24hrs), Avg:98.3 F (36.8 C), Min:97.7 F (36.5 C), Max:98.9 F (37.2 C)  Recent Labs  Lab 11/07/20 1436 11/08/20 0442 11/09/20 0525 11/10/20 0201  WBC 9.4 7.7 8.6 8.0  CREATININE 1.11 1.17 1.11 1.16  LATICACIDVEN 0.8  --   --   --      Estimated Creatinine Clearance: 76.2 mL/min (by C-G formula based on SCr of 1.16 mg/dL).    No Known Allergies  Antimicrobials this admission: 7/14 Zosyn x1 7/14 Cefepime >>  7/14 Vancomycin >>  7/15 Flagyl>>  Dose adjustments this admission:  Microbiology results: 7/14 BCx: sent  Thank you for allowing pharmacy to be a part of this patient's care.  Willette Cluster PharmD 11/10/2020 7:57 AM

## 2020-11-10 NOTE — Assessment & Plan Note (Addendum)
S/p Right common iliac stent on 08/05/2020. S/p Right SFA to below-knee pop bypass with vein on 08/07/2020. Hx prior Left BKA. --Vascular following

## 2020-11-10 NOTE — Assessment & Plan Note (Signed)
Continue Coreg, statin and losartan.   Outpatient follow-up with cardiology

## 2020-11-10 NOTE — Progress Notes (Signed)
[place] for Warfarin>heparin Indication: atrial fibrillation  No Known Allergies  Patient Measurements: Height: 6' (182.9 cm) Weight: 104.6 kg (230 lb 9.6 oz) IBW/kg (Calculated) : 77.6  Vital Signs: Temp: 98.2 F (36.8 C) (07/17 0448) Temp Source: Oral (07/17 0448) BP: 135/70 (07/17 0448) Pulse Rate: 71 (07/17 0448)  Labs: Recent Labs    11/08/20 0442 11/09/20 0525 11/10/20 0201  HGB 9.9* 10.0* 9.1*  HCT 31.1* 31.4* 28.0*  PLT 274 275 270  LABPROT 34.4* 18.5* 16.8*  INR 3.4* 1.6* 1.4*  HEPARINUNFRC  --   --  0.42  CREATININE 1.17 1.11 1.16    Estimated Creatinine Clearance: 76.2 mL/min (by C-G formula based on SCr of 1.16 mg/dL).  Assessment:  23 yoM with R great toe infection x 2 months, Warfarin 5mg  qd for Afib, LD 7/13, recent DVT 08/14/20. Baseline INR 1.9  INR 3.4>1.6 - warfarin on hold.   Heparin level is therapeutic at 0.42, on 1800 units/hr. Hgb 9.1, plt 270. No s/sx of bleeding or infusion issues.   Goal of Therapy:  Heparin level: 0.3-0.7 INR 2-3 Monitor platelets by anticoagulation protocol: Yes   Plan:  Continue heparin infusion at 1800 units/hr  Will order a confirmatory heparin level Monitor daily HL, CBC, and for s/sx of bleeding   Lestine Box, PharmD PGY2 Infectious Diseases Pharmacy Resident   Please check [email or web address] for unit-specific pharmacy phone numbers

## 2020-11-10 NOTE — Assessment & Plan Note (Signed)
Due to PVD and diabetic infection.

## 2020-11-10 NOTE — Assessment & Plan Note (Signed)
Due to PVD.  No acute issues. Ambulatatory with prosthesis.

## 2020-11-10 NOTE — Progress Notes (Signed)
PROGRESS NOTE    Rodney [name]   DGU:[id number]  DOB: 1950-07-27  PCP: [name], MD    DOA: 11/07/2020 LOS: 3   Assessment & Plan   Principal Problem:   Osteomyelitis of right foot ([place]) Active Problems:   PVD (peripheral vascular disease) (HCC)   HFrEF (heart failure with reduced ejection fraction) (HCC)   Gangrene of toe of right foot ([place])   Essential hypertension   DM (diabetes mellitus), type 2 with neurological complications (HCC)   AF (paroxysmal atrial fibrillation) (HCC)   Supratherapeutic INR   Hypercholesteremia   Anemia in chronic illness   S/P BKA (below knee amputation) unilateral, left (HCC)   CAD (coronary artery disease)   CKD stage 3 due to type 2 diabetes mellitus ([place])   Obesity   History of deep vein thrombosis (DVT) of lower extremity   Osteomyelitis of right foot ([place]) Due to diabetic foot infection with necrosis and wound infested with maggots. X-ray of the right foot showed findings suspicious for osteomyelitis.   MRI of right foot showed soft tissue ulceration in the plantar and lateral aspect of the right great toe without underlying focal abscess with no evidence of osteomyelitis or septic joint. Patient has been afebrile and without systemic sx's of infection --On empiric Vancomycin, Cefepime, Flagyl.   --Orthopedic and Vascular surgery consulted. --ABI in right lower extremity arterial duplex pending.   --Expect he will need toe amputated but pt wants to avoid if at all possible.  AF (paroxysmal atrial fibrillation) (HCC) Chronically anticoagulated on Coumadin. Rate has been controlled. --Hold warfarin in case of surgery --Continue heparin drip --Continue amiodarone --Cardiology follow-up outpatient  Supratherapeutic INR INR 3.4 on admission.  Resolved. Monitor daily. Warfarin dosing per phamacy.  History of deep vein thrombosis (DVT) of lower extremity DVT of right LE diagnosed in April 2022. On coumadin.   S/P BKA  (below knee amputation) unilateral, left (HCC) Due to PVD.  No acute issues. Ambulatatory with prosthesis.  PVD (peripheral vascular disease) ([place]) S/p Right common iliac stent on 08/05/2020. S/p Right SFA to below-knee pop bypass with vein on 08/07/2020. Hx prior Left BKA. --Vascular following  DM (diabetes mellitus), type 2 with neurological complications (HCC) Continue 70/30 insulin along with CBGs with SSI  CKD stage 3 due to type 2 diabetes mellitus ([place]) Renal function stable. --Monitor BMP  Anemia in chronic illness Most likely due to renal disease.  Hbg near baseline, stable. --Monitor CBC  CAD (coronary artery disease) Continue Coreg, statin and losartan.   Outpatient follow-up with cardiology  Essential hypertension -Continue Coreg, losartan  Hypercholesteremia Continue statin  Obesity Body mass index is 31.28 kg/m. Complicates overall care and prognosis.  Recommend lifestyle modifications including physical activity and diet for weight loss and overall long-term health.   HFrEF (heart failure with reduced ejection fraction) (HCC) Stable, appears euvolemic. Echo in April 2022 - EF 50-55%, grade II diastolic dysfunction, severe bi-atrial dilation, moderate AS. --on Coreg, losartan --Hold home Lasix (40 mg daily) --I/O's & daily weights  Gangrene of toe of right foot ([place]) Due to PVD and diabetic infection.     Patient BMI: Body mass index is 31.28 kg/m.   DVT prophylaxis:    Diet:  Diet Orders (From admission, onward)     Start     Ordered   11/07/20 1727  Diet heart healthy/carb modified Room service appropriate? Yes; Fluid consistency: Thin; Fluid restriction: 1200 mL Fluid  Diet effective now  Question Answer Comment  Diet-HS Snack? Nothing   Room service appropriate? Yes   Fluid consistency: Thin   Fluid restriction: 1200 mL Fluid      11/07/20 1728              Code Status: Full Code   Brief Narrative / Hospital Course to  Date:   70 year old male with history of peripheral vascular disease status post femoropopliteal bypass in April 2022 following which patient was also admitted the same month for cellulitis of right lower extremity treated with antibiotics, left BKA for necrotizing fasciitis in 2021, CAD, diabetes mellitus type 2, hypertension, paroxysmal A. fib on Coumadin, right lower extremity DVT diagnosed in April 2022 presented with right great toe worsening wound with maggots.  On presentation, x-ray revealed possible right great toe osteomyelitis.  He was started on IV antibiotics.  Orthopedics and subsequently vascular surgery were consulted.  Subjective 11/10/20    Pt seen with wife at bedside.  Pt was unhappy to hear he may need toe amputation.  Wants to avoid at all cost, says someone says it's healing and someone else said needs amputation, expresses frustration..  No fever/chills, pain oro other complaints at this time.    Disposition Plan & Communication   Status is: Inpatient  Remains inpatient appropriate because:Ongoing diagnostic testing needed not appropriate for outpatient work up  Dispo: The patient is from: Home              Anticipated d/c is to: Home              Patient currently is not medically stable to d/c.   Difficult to place patient No  Family Communication: wife at bedside on rounds    Consults, Procedures, Significant Events   Consultants:  Orthopedic surgery Vascular surgery  Procedures:  ABI's  Antimicrobials:  Anti-infectives (From admission, onward)    Start     Dose/Rate Route Frequency Ordered Stop   11/08/20 1500  vancomycin (VANCOREADY) IVPB 1500 mg/300 mL        1,500 mg 150 mL/hr over 120 Minutes Intravenous Every 24 hours 11/07/20 2000     11/08/20 1200  metroNIDAZOLE (FLAGYL) IVPB 500 mg        500 mg 100 mL/hr over 60 Minutes Intravenous Every 8 hours 11/08/20 1100     11/07/20 2200  ceFEPIme (MAXIPIME) 2 g in sodium chloride 0.9 % 100 mL IVPB         2 g 200 mL/hr over 30 Minutes Intravenous Every 8 hours 11/07/20 1728     11/07/20 1730  vancomycin (VANCOCIN) IVPB 1000 mg/200 mL premix  Status:  Discontinued        1,000 mg 200 mL/hr over 60 Minutes Intravenous  Once 11/07/20 1728 11/07/20 1831   11/07/20 1430  vancomycin (VANCOREADY) IVPB 2000 mg/400 mL        2,000 mg 200 mL/hr over 120 Minutes Intravenous  Once 11/07/20 1347 11/07/20 1727   11/07/20 1345  piperacillin-tazobactam (ZOSYN) IVPB 3.375 g        3.375 g 100 mL/hr over 30 Minutes Intravenous  Once 11/07/20 1344 11/07/20 1516         Micro    Objective   Vitals:   11/09/20 1958 11/10/20 0010 11/10/20 0448 11/10/20 1259  BP: 126/61 (!) 121/57 135/70 122/60  Pulse: 63 65 71 71  Resp: 18 18 18 17   Temp: 97.7 F (36.5 C) 97.9 F (36.6 C) 98.2 F (36.8 C) 98.3 F (36.8  C)  TempSrc: Oral Oral Oral Oral  SpO2: 99% 99% 97% 96%  Weight:   104.6 kg   Height:        Intake/Output Summary (Last 24 hours) at 11/10/2020 1459 Last data filed at 11/10/2020 0800 Gross per 24 hour  Intake 535.26 ml  Output 1300 ml  Net -764.74 ml   Filed Weights   11/07/20 2211 11/09/20 0500 11/10/20 0448  Weight: 100 kg 101.6 kg 104.6 kg    Physical Exam:  General exam: awake, alert, no acute distress HEENT: moist mucus membranes, hearing grossly normal  Respiratory system: CTAB, no wheezes, rales or rhonchi, normal respiratory effort. Cardiovascular system: normal S1/S2, RRR, no pedal edema.   Central nervous system: A&O x3. no gross focal neurologic deficits, normal speech Extremities: L BKA, R foot with clean/dry/intact dressing in pace, normal tone, no peripheral edema Psychiatry: normal mood, congruent affect  Labs   Data Reviewed: I have personally reviewed following labs and imaging studies  CBC: Recent Labs  Lab 11/07/20 1436 11/08/20 0442 11/09/20 0525 11/10/20 0201  WBC 9.4 7.7 8.6 8.0  NEUTROABS 6.2  --  6.3 5.3  HGB 11.1* 9.9* 10.0* 9.1*  HCT  34.4* 31.1* 31.4* 28.0*  MCV 92.2 94.5 93.2 91.2  PLT 326 274 275 829   Basic Metabolic Panel: Recent Labs  Lab 11/07/20 1436 11/08/20 0442 11/09/20 0525 11/10/20 0201  NA 135 135 137 135  K 4.1 4.2 4.3 4.2  CL 103 103 106 104  CO2 26 27 24 24   GLUCOSE 109* 122* 112* 147*  BUN 25* 21 22 30*  CREATININE 1.11 1.17 1.11 1.16  CALCIUM 8.9 8.7* 8.7* 8.6*  MG  --   --  1.8 2.0   GFR: Estimated Creatinine Clearance: 76.2 mL/min (by C-G formula based on SCr of 1.16 mg/dL). Liver Function Tests: Recent Labs  Lab 11/07/20 1436  AST 13*  ALT 13  ALKPHOS 70  BILITOT 0.6  PROT 7.3  ALBUMIN 3.5   No results for input(s): LIPASE, AMYLASE in the last 168 hours. No results for input(s): AMMONIA in the last 168 hours. Coagulation Profile: Recent Labs  Lab 11/07/20 1832 11/08/20 0442 11/09/20 0525 11/10/20 0201  INR 1.9* 3.4* 1.6* 1.4*   Cardiac Enzymes: No results for input(s): CKTOTAL, CKMB, CKMBINDEX, TROPONINI in the last 168 hours. BNP (last 3 results) No results for input(s): PROBNP in the last 8760 hours. HbA1C: No results for input(s): HGBA1C in the last 72 hours. CBG: Recent Labs  Lab 11/09/20 1617 11/09/20 1809 11/09/20 2052 11/10/20 0813 11/10/20 1256  GLUCAP 95 92 121* 132* 115*   Lipid Profile: No results for input(s): CHOL, HDL, LDLCALC, TRIG, CHOLHDL, LDLDIRECT in the last 72 hours. Thyroid Function Tests: No results for input(s): TSH, T4TOTAL, FREET4, T3FREE, THYROIDAB in the last 72 hours. Anemia Panel: No results for input(s): VITAMINB12, FOLATE, FERRITIN, TIBC, IRON, RETICCTPCT in the last 72 hours. Sepsis Labs: Recent Labs  Lab 11/07/20 1436  LATICACIDVEN 0.8    Recent Results (from the past 240 hour(s))  Blood culture (routine x 2)     Status: None (Preliminary result)   Collection Time: 11/07/20  2:36 PM   Specimen: BLOOD  Result Value Ref Range Status   Specimen Description BLOOD BLOOD RIGHT FOREARM  Final   Special Requests   Final     BOTTLES DRAWN AEROBIC AND ANAEROBIC Blood Culture adequate volume   Culture   Final    NO GROWTH 2 DAYS Performed at [facility], [place] [address] [place], [place] [address]    Report Status PENDING  Incomplete  Blood culture (routine x 2)     Status: None (Preliminary result)   Collection Time: 11/07/20  2:36 PM   Specimen: BLOOD RIGHT HAND  Result Value Ref Range Status   Specimen Description BLOOD RIGHT HAND  Final   Special Requests   Final    BOTTLES DRAWN AEROBIC AND ANAEROBIC Blood Culture adequate volume   Culture   Final    NO GROWTH 2 DAYS Performed at [facility], [place] [address], [place], [place] [address]    Report Status PENDING  Incomplete  SARS CORONAVIRUS 2 (TAT 6-24 HRS) Nasopharyngeal Nasopharyngeal Swab     Status: None   Collection Time: 11/07/20  4:24 PM   Specimen: Nasopharyngeal Swab  Result Value Ref Range Status   SARS Coronavirus 2 NEGATIVE NEGATIVE Final    Comment: (NOTE) SARS-CoV-2 target nucleic acids are NOT DETECTED.  The SARS-CoV-2 RNA is generally detectable in upper and lower respiratory specimens during the acute phase of infection. Negative results do not preclude SARS-CoV-2 infection, do not rule out co-infections with other pathogens, and should not be used as the sole basis for treatment or other patient management decisions. Negative results must be combined with clinical observations, patient history, and epidemiological information. The expected result is Negative.  Fact Sheet for Patients: SugarRoll.be  Fact Sheet for Healthcare Providers: https://www.woods-mathews.com/  This test is not yet approved or cleared by the [place] FDA and  has been authorized for detection and/or diagnosis of SARS-CoV-2 by FDA under an Emergency Use Authorization (EUA). This EUA will remain  in effect (meaning this test can be used) for the duration of the COVID-19 declaration under Se  ction 564(b)(1) of the Act, 21 U.S.C. section 360bbb-3(b)(1), unless the authorization is terminated or revoked sooner.  Performed at [facility], [place] [address] [place], [place] [address]       Imaging Studies   VAS [place] ABI WITH/WO TBI  Result Date: 11/10/2020  LOWER EXTREMITY DOPPLER STUDY Patient Name:  Rodney [name]  Date of Exam:   11/10/2020 Medical Rec #: [id number]          Accession #:    [id number] Date of Birth: [birth year]         Patient Gender: M Patient Age:   69Y Exam Location:  [facility] Procedure:      VAS [place] ABI WITH/WO TBI Referring Phys: [id number] Marty Heck --------------------------------------------------------------------------------  Indications: Ulceration of great toe, and peripheral artery disease. High Risk         Hypertension, hyperlipidemia, Diabetes, coronary artery Factors:          disease.  Vascular Interventions: History of right common iliac stent 08/05/20 and right                         SFA to below knee popliteal bypass 08/07/20. Left BKA. Limitations: Today's exam was limited due to an open wound and bandages. Performing Technologist: Sharion Dove RVS  Examination Guidelines: A complete evaluation includes at minimum, Doppler waveform signals and systolic blood pressure reading at the level of bilateral brachial, anterior tibial, and posterior tibial arteries, when vessel segments are accessible. Bilateral testing is considered an integral part of a complete examination. Photoelectric Plethysmograph (PPG) waveforms and toe systolic pressure readings are included as required and additional duplex testing as needed. Limited examinations  for reoccurring indications may be performed as noted.  ABI Findings: +---------+------------------+-----+------------------+--------+ Right    Rt Pressure (mmHg)IndexWaveform          Comment  +---------+------------------+-----+------------------+--------+ Brachial 165                     multiphasic                +---------+------------------+-----+------------------+--------+ PTA      139               0.84 monophasic                 +---------+------------------+-----+------------------+--------+ DP       80                0.48 monophasic                 +---------+------------------+-----+------------------+--------+ Great Toe                       ulceration/bandage         +---------+------------------+-----+------------------+--------+ +--------+------------------+-----+-----------+-------+ Left    Lt Pressure (mmHg)IndexWaveform   Comment +--------+------------------+-----+-----------+-------+ [id number]                    multiphasic        +--------+------------------+-----+-----------+-------+ PTA                                       BKA     +--------+------------------+-----+-----------+-------+ DP                                        BKA     +--------+------------------+-----+-----------+-------+ +-------+-----------+-----------+------------+------------+ ABI/TBIToday's ABIToday's TBIPrevious ABIPrevious TBI +-------+-----------+-----------+------------+------------+ Right  0.84                  0.57                     +-------+-----------+-----------+------------+------------+  Summary: Right: Resting right ankle-brachial index indicates mild right lower extremity arterial disease.  *See table(s) above for measurements and observations.  Electronically signed by Monica Martinez MD on 11/10/2020 at 2:54:50 PM.    Final    VAS [place] LOWER EXTREMITY ARTERIAL DUPLEX  Result Date: 11/10/2020 LOWER EXTREMITY ARTERIAL DUPLEX STUDY Patient Name:  Rodney [name]  Date of Exam:   11/10/2020 Medical Rec #: [id number]          Accession #:    [id number] Date of Birth: [birth year]         Patient Gender: M Patient Age:   69Y Exam Location:  [facility] Procedure:      VAS [place] LOWER EXTREMITY ARTERIAL DUPLEX Referring Phys:  [id number] Marty Heck --------------------------------------------------------------------------------  Indications: Ulceration of right great toe, and peripheral artery disease. High Risk Factors: Hypertension, hyperlipidemia, Diabetes, coronary artery                    disease.  Vascular Interventions: History of right common iliac stent 08/05/20 and                         right SFA to below knee popliteal bypass 08/07/20. Left  BKA. Current ABI:            Right: 0.84 Comparison Study: No prior arterial duplex on file Performing Technologist: Sharion Dove RVS  Examination Guidelines: A complete evaluation includes B-mode imaging, spectral Doppler, color Doppler, and power Doppler as needed of all accessible portions of each vessel. Bilateral testing is considered an integral part of a complete examination. Limited examinations for reoccurring indications may be performed as noted.  +----------+--------+-----+--------+-----------+--------+ RIGHT     PSV cm/sRatioStenosisWaveform   Comments +----------+--------+-----+--------+-----------+--------+ CFA Prox  119                  multiphasic         +----------+--------+-----+--------+-----------+--------+ DFA       85                   multiphasic         +----------+--------+-----+--------+-----------+--------+ SFA Prox  21                                       +----------+--------+-----+--------+-----------+--------+ SFA Mid                occluded                    +----------+--------+-----+--------+-----------+--------+ TP Trunk  18                   monophasic          +----------+--------+-----+--------+-----------+--------+ ATA Mid   38                   monophasic          +----------+--------+-----+--------+-----------+--------+ ATA Distal35                   monophasic          +----------+--------+-----+--------+-----------+--------+ PTA Prox  32                    monophasic          +----------+--------+-----+--------+-----------+--------+ PTA Mid   32                   monophasic          +----------+--------+-----+--------+-----------+--------+ PTA Distal39                   monophasic          +----------+--------+-----+--------+-----------+--------+  Right Graft #1: SFA to below knee popliteal bypass with vein +------------------+--------+--------+----------+--------+                   PSV cm/sStenosisWaveform  Comments +------------------+--------+--------+----------+--------+ Inflow                                               +------------------+--------+--------+----------+--------+ Prox Anastomosis                                     +------------------+--------+--------+----------+--------+ Proximal Graft    37              monophasic         +------------------+--------+--------+----------+--------+ Mid Graft         48  monophasic         +------------------+--------+--------+----------+--------+ Distal Graft      84              monophasic         +------------------+--------+--------+----------+--------+ Distal Anastomosis                                   +------------------+--------+--------+----------+--------+ Outflow           18              monophasic         +------------------+--------+--------+----------+--------+ Graft is patent  Summary: Right: Patent bypass. Multiphasic waveforms noted in the common femoral and profunda arteries. Monophasic flow throughout the bypass and in the posterior tibial and anterior tibial arteries. Large area containing mixed echoes, measuring 4.42 X 7.88, is noted in the distal thigh and proximal popliteal fossa, cannot rule out hematoma.  See table(s) above for measurements and observations. Electronically signed by Monica Martinez MD on 11/10/2020 at 2:55:36 PM.    Final      Medications   Scheduled Meds:  amiodarone  200 mg Oral Daily    atorvastatin  80 mg Oral Daily   carvedilol  6.25 mg Oral BID WC   insulin aspart  0-9 Units Subcutaneous TID WC   insulin aspart protamine- aspart  15 Units Subcutaneous Q supper   insulin aspart protamine- aspart  30 Units Subcutaneous Q breakfast   losartan  12.5 mg Oral Daily   multivitamin with minerals  1 tablet Oral Daily   nutrition supplement (JUVEN)  1 packet Oral BID BM   Ensure Max Protein  11 oz Oral BID   Continuous Infusions:  ceFEPime (MAXIPIME) IV 2 g (11/10/20 1454)   heparin 1,800 Units/hr (11/10/20 0504)   metronidazole 500 mg (11/10/20 1259)   vancomycin 1,500 mg (11/09/20 1545)       LOS: 3 days    Time spent: 30 minutes    Ezekiel Slocumb, DO [facility]  11/10/2020, 2:59 PM      If 7PM-7AM, please contact night-coverage. How to contact the [facility] Attending or Consulting provider [place] or covering provider during after hours [place], for this patient?    Check the care team in [facility] and look for a) attending/consulting TR[facility] provider listed and b) the [facility] team listed Log into [email or web address] and use [place]'s universal password to access. If you do not have the password, please contact the hospital operator. Locate the [facility] provider you are looking for under [facility] and page to a number that you can be directly reached. If you still have difficulty reaching the provider, please page the [facility] (Director on Call) for the Hospitalists listed on amion for assistance.

## 2020-11-10 NOTE — Assessment & Plan Note (Signed)
Continue statin. 

## 2020-11-10 NOTE — Assessment & Plan Note (Addendum)
Chronically anticoagulated on Coumadin. Rate has been controlled. --Hold warfarin in case of surgery --Continue heparin drip --Continue amiodarone --Cardiology follow-up outpatient

## 2020-11-10 NOTE — Assessment & Plan Note (Signed)
Continue 70/30 insulin along with CBGs with SSI

## 2020-11-10 NOTE — Assessment & Plan Note (Signed)
Body mass index is 31.28 kg/m. Complicates overall care and prognosis.  Recommend lifestyle modifications including physical activity and diet for weight loss and overall long-term health.

## 2020-11-10 NOTE — Assessment & Plan Note (Addendum)
Due to diabetic foot infection with necrosis and wound infested with maggots. X-ray of the right foot showed findings suspicious for osteomyelitis.   MRI of right foot showed soft tissue ulceration in the plantar and lateral aspect of the right great toe without underlying focal abscess with no evidence of osteomyelitis or septic joint. Patient has been afebrile and without systemic sx's of infection --On broad spectrum coverage with empiric Vancomycin, Cefepime, Flagyl.   --Vascular surgery following: plan for angiogram Wednesday as it appears recent graft may be failing --Orthopedic surgery following: Dr Sharol Given plans for R great toe amputation Friday --ABI in right lower extremity arterial duplex pending.   --Expect he will need toe amputated but pt wants to avoid if at all possible.

## 2020-11-10 NOTE — Assessment & Plan Note (Signed)
INR 3.4 on admission.  Resolved. Monitor daily. Warfarin dosing per phamacy.

## 2020-11-10 NOTE — Assessment & Plan Note (Addendum)
Most likely due to renal disease.  Hbg near baseline, stable. --Monitor CBC

## 2020-11-10 NOTE — Assessment & Plan Note (Signed)
DVT of right LE diagnosed in April 2022. On coumadin.

## 2020-11-10 NOTE — Progress Notes (Signed)
Patient ID: Rodney [name], male   DOB: 16-Nov-1950, 70 y.o.   MRN: [id number] ABI   right  0.84  .  Monophasic.   Dr. Sharol Given sent text and he can follow up Monday.

## 2020-11-10 NOTE — Assessment & Plan Note (Signed)
Renal function stable. Monitor BMP. 

## 2020-11-10 NOTE — Progress Notes (Signed)
VASCULAR LAB    ABIs have been performed.  See CV proc for preliminary results.   Jessicia Napolitano, RVT 11/10/2020, 10:07 AM

## 2020-11-10 NOTE — Assessment & Plan Note (Signed)
-  Continue Coreg, losartan

## 2020-11-10 NOTE — Assessment & Plan Note (Addendum)
Stable, appears euvolemic. Echo in April 2022 - EF 50-55%, grade II diastolic dysfunction, severe bi-atrial dilation, moderate AS. --on Coreg, losartan --Resumed home Lasix 40 mg daily, initially held --I/O's & daily weights

## 2020-11-10 NOTE — Plan of Care (Signed)

## 2020-11-11 ENCOUNTER — Other Ambulatory Visit: Payer: Self-pay | Admitting: Physician Assistant

## 2020-11-11 DIAGNOSIS — G629 Polyneuropathy, unspecified: Secondary | ICD-10-CM

## 2020-11-11 DIAGNOSIS — D638 Anemia in other chronic diseases classified elsewhere: Secondary | ICD-10-CM

## 2020-11-11 DIAGNOSIS — S91101A Unspecified open wound of right great toe without damage to nail, initial encounter: Secondary | ICD-10-CM

## 2020-11-11 DIAGNOSIS — I70261 Atherosclerosis of native arteries of extremities with gangrene, right leg: Secondary | ICD-10-CM

## 2020-11-11 DIAGNOSIS — M869 Osteomyelitis, unspecified: Secondary | ICD-10-CM | POA: Diagnosis not present

## 2020-11-11 DIAGNOSIS — E1149 Type 2 diabetes mellitus with other diabetic neurological complication: Secondary | ICD-10-CM

## 2020-11-11 DIAGNOSIS — I739 Peripheral vascular disease, unspecified: Secondary | ICD-10-CM | POA: Diagnosis not present

## 2020-11-11 DIAGNOSIS — I96 Gangrene, not elsewhere classified: Secondary | ICD-10-CM

## 2020-11-11 DIAGNOSIS — I480 Paroxysmal atrial fibrillation: Secondary | ICD-10-CM | POA: Diagnosis not present

## 2020-11-11 DIAGNOSIS — Z89512 Acquired absence of left leg below knee: Secondary | ICD-10-CM

## 2020-11-11 LAB — BASIC METABOLIC PANEL
Anion gap: 4 — ABNORMAL LOW (ref 5–15)
BUN: 34 mg/dL — ABNORMAL HIGH (ref 8–23)
CO2: 23 mmol/L (ref 22–32)
Calcium: 8.5 mg/dL — ABNORMAL LOW (ref 8.9–10.3)
Chloride: 108 mmol/L (ref 98–111)
Creatinine, Ser: 1.16 mg/dL (ref 0.61–1.24)
GFR, Estimated: >60 mL/min (ref >60)
Glucose, Bld: 139 mg/dL — ABNORMAL HIGH (ref 70–99)
Potassium: 4.2 mmol/L (ref 3.5–5.1)
Sodium: 135 mmol/L (ref 135–145)

## 2020-11-11 LAB — CBC
HCT: 27.7 % — ABNORMAL LOW (ref 39.0–52.0)
Hemoglobin: 8.8 g/dL — ABNORMAL LOW (ref 13.0–17.0)
MCH: 29.7 pg (ref 26.0–34.0)
MCHC: 31.8 g/dL (ref 30.0–36.0)
MCV: 93.6 fL (ref 80.0–100.0)
Platelets: 261 10*3/uL (ref 150–400)
RBC: 2.96 MIL/uL — ABNORMAL LOW (ref 4.22–5.81)
RDW: 14 % (ref 11.5–15.5)
WBC: 7 10*3/uL (ref 4.0–10.5)
nRBC: 0 % (ref 0.0–0.2)

## 2020-11-11 LAB — PROTIME-INR
INR: 1.3 — ABNORMAL HIGH (ref 0.8–1.2)
Prothrombin Time: 16.1 seconds — ABNORMAL HIGH (ref 11.4–15.2)

## 2020-11-11 LAB — GLUCOSE, CAPILLARY
Glucose-Capillary: 119 mg/dL — ABNORMAL HIGH (ref 70–99)
Glucose-Capillary: 82 mg/dL (ref 70–99)
Glucose-Capillary: 111 mg/dL — ABNORMAL HIGH (ref 70–99)
Glucose-Capillary: 123 mg/dL — ABNORMAL HIGH (ref 70–99)

## 2020-11-11 LAB — VANCOMYCIN, TROUGH: Vancomycin Tr: 16 ug/mL (ref 15–20)

## 2020-11-11 LAB — HEPARIN LEVEL (UNFRACTIONATED): Heparin Unfractionated: 0.49 IU/mL (ref 0.30–0.70)

## 2020-11-11 MED ORDER — VANCOMYCIN HCL 750 MG/150ML IV SOLN: 750.0000 mg | INTRAVENOUS | Status: DC

## 2020-11-11 MED ORDER — GABAPENTIN 100 MG PO CAPS
200.0000 mg | ORAL_CAPSULE | Freq: Three times a day (TID) | ORAL | Status: DC
  Administered 2020-11-11 – 2020-11-17 (×17): 200 mg via ORAL
  Filled 2020-11-11 (×18): qty 2

## 2020-11-11 MED ORDER — NICOTINE 7 MG/24HR TD PT24
7.0000 mg | MEDICATED_PATCH | Freq: Every day | TRANSDERMAL | Status: AC
  Administered 2020-11-11: 7 mg via TRANSDERMAL
  Filled 2020-11-11: qty 1

## 2020-11-11 NOTE — Progress Notes (Signed)
  Progress Note    11/11/2020 12:17 PM  Subjective: No overnight issues, tolerating diet at time of interview  Vitals:   11/11/20 0458 11/11/20 0746  BP: 138/67 (!) 154/75  Pulse: 66 71  Resp: 18 17  Temp: 98.2 F (36.8 C) 98.2 F (36.8 C)  SpO2: 98% 97%    Physical Exam: Awake alert oriented Palpable right common femoral pulse No palpable right popliteal pulse Dressing on right foot clean dry intact  CBC    Component Value Date/Time   WBC 7.0 11/11/2020 0047   RBC 2.96 (L) 11/11/2020 0047   HGB 8.8 (L) 11/11/2020 0047   HGB 10.7 (L) 08/29/2020 1501   HCT 27.7 (L) 11/11/2020 0047   HCT 33.0 (L) 08/29/2020 1501   PLT 261 11/11/2020 0047   PLT 371 08/29/2020 1501   MCV 93.6 11/11/2020 0047   MCV 89 08/29/2020 1501   MCH 29.7 11/11/2020 0047   MCHC 31.8 11/11/2020 0047   RDW 14.0 11/11/2020 0047   RDW 13.9 08/29/2020 1501   LYMPHSABS 1.6 11/10/2020 0201   MONOABS 0.8 11/10/2020 0201   EOSABS 0.3 11/10/2020 0201   BASOSABS 0.1 11/10/2020 0201    BMET    Component Value Date/Time   NA 135 11/11/2020 0047   NA 136 09/05/2020 1442   K 4.2 11/11/2020 0047   CL 108 11/11/2020 0047   CO2 23 11/11/2020 0047   GLUCOSE 139 (H) 11/11/2020 0047   BUN 34 (H) 11/11/2020 0047   BUN 29 (H) 09/05/2020 1442   CREATININE 1.16 11/11/2020 0047   CREATININE 1.19 10/09/2015 1554   CALCIUM 8.5 (L) 11/11/2020 0047   GFRNONAA >60 11/11/2020 0047   GFRNONAA 65 10/09/2015 1554   GFRAA 52 (L) 05/01/2020 1632   GFRAA 75 10/09/2015 1554    INR    Component Value Date/Time   INR 1.3 (H) 11/11/2020 0047     Intake/Output Summary (Last 24 hours) at 11/11/2020 1217 Last data filed at 11/11/2020 0746 Gross per 24 hour  Intake 1298 ml  Output 2570 ml  Net -1272 ml     Assessment/plan:  70 y.o. male is here with progressive gangrenous changes of right great toe.  Noninvasive vascular imaging demonstrates monophasic flow throughout his bypass graft with concern for impending  graft failure.  Plan will be for angiography on Wednesday 11/13/20 from a left common femoral approach and he will undergo subsequent toe amputation on Friday with Dr. Sharol Given.  I have discussed the plan with Dr. Sharol Given and the patient.   Stacey Maura C. Donzetta Matters, MD [facility] Office: [phone] Pager: [phone]  11/11/2020 12:17 PM

## 2020-11-11 NOTE — Plan of Care (Signed)

## 2020-11-11 NOTE — Progress Notes (Addendum)
Patient ID: Rodney [name], male   DOB: May 10, 1950, 70 y.o.   MRN: [id number] Plan for right great toe amputation, Wednesday, full note to follow.  I have discussed patient's case with Dr. Donzetta Matters of vascular vein surgery.  Anticipate endovascular evaluation on Wednesday with potential amputation of the great toe on Friday.

## 2020-11-11 NOTE — Consult Note (Signed)
ORTHOPAEDIC CONSULTATION  REQUESTING PHYSICIAN: Ezekiel Slocumb, DO  Chief Complaint: Gangrenous ulcer right great toe.  HPI: Rodney [name] is a 70 y.o. male who presents with gangrenous ulcer right great toe.  Patient is status post endovascular revascularization however he he still has diminished flow to the right lower extremity.  Recent Doppler shows monophasic flow a Doppler was obtained yesterday which showed no definite stenosis.  Past Medical History:  Diagnosis Date   CHF (congestive heart failure) (HCC)    Coronary artery disease    Diabetes mellitus without complication (HCC)    HLD (hyperlipidemia)    Hypertension    Peripheral edema 02/24/2018   SOB (shortness of breath) 07/26/2018   Past Surgical History:  Procedure Laterality Date   ABDOMINAL AORTOGRAM W/LOWER EXTREMITY N/A 08/05/2020   Procedure: ABDOMINAL AORTOGRAM W/LOWER EXTREMITY;  Surgeon: Marty Heck, MD;  Location: [facility];  Service: Cardiovascular;  Laterality: N/A;   AMPUTATION Left 09/28/2019   Procedure: AMPUTATION BELOW KNEE;  Surgeon: Newt Minion, MD;  Location: [facility];  Service: Orthopedics;  Laterality: Left;   CARDIAC CATHETERIZATION     CARDIOVERSION N/A 10/05/2019   Procedure: CARDIOVERSION;  Surgeon: Sanda Klein, MD;  Location: [facility];  Service: Cardiovascular;  Laterality: N/A;   FEMORAL-POPLITEAL BYPASS GRAFT Right 08/07/2020   Procedure: RIGHT FEMORAL TO BELOW KNEE POPLITEAL ARTERY BYPASS;  Surgeon: Waynetta Sandy, MD;  Location: [facility];  Service: Vascular;  Laterality: Right;   LEFT HEART CATH AND CORONARY ANGIOGRAPHY N/A 10/03/2019   Procedure: LEFT HEART CATH AND CORONARY ANGIOGRAPHY;  Surgeon: Lorretta Harp, MD;  Location: [facility];  Service: Cardiovascular;  Laterality: N/A;   PERIPHERAL VASCULAR INTERVENTION Right 08/05/2020   Procedure: PERIPHERAL VASCULAR INTERVENTION;  Surgeon: Marty Heck, MD;  Location: [facility];  Service: Cardiovascular;  Laterality: Right;  common Iliac   TEE WITHOUT CARDIOVERSION N/A 10/05/2019   Procedure: TRANSESOPHAGEAL ECHOCARDIOGRAM (TEE);  Surgeon: Sanda Klein, MD;  Location[facility];  Service: Cardiovascular;  Laterality: N/A;   Social History   Socioeconomic History   Marital status: Widowed    Spouse name: Not on file   Number of children: Not on file   Years of education: Not on file   Highest education level: Not on file  Occupational History   Not on file  Tobacco Use   Smoking status: Every Day    Packs/day: 0.50    Years: 50.00    Pack years: 25.00    Types: Cigarettes   Smokeless tobacco: Never  Vaping Use   Vaping Use: Never used  Substance and Sexual Activity   Alcohol use: Yes    Alcohol/week: 6.0 standard drinks    Types: 6 Standard drinks or equivalent per week    Comment: 11/07/20 - states he has not drank in 6 months   Drug use: No   Sexual activity: Yes    Partners: Female    Comment: monagamous stable relationship  Other Topics Concern   Not on file  Social History Narrative   Not on file   Social Determinants of Health   Financial Resource Strain: Not on file  Food Insecurity: Not on file  Transportation Needs: Not on file  Physical Activity: Not on file  Stress: Not on file  Social Connections: Not on file   Family History  Problem Relation Age of Onset   Alcoholism Mother    Alcoholism Father    - negative  except otherwise stated in the family history section No Known Allergies Prior to Admission medications   Medication Sig Start Date End Date Taking? Authorizing Provider  acetaminophen (TYLENOL) 500 MG tablet Take 1,000 mg by mouth every 6 (six) hours as needed for moderate pain.   Yes [provider]  amiodarone (PACERONE) 200 MG tablet Take 1 tablet (200 mg total) by mouth daily. 10/14/20  Yes Hensel, Jamal Collin, MD  atorvastatin (LIPITOR) 80 MG tablet TAKE 1 TABLET EVERY DAY Patient taking  differently: Take 80 mg by mouth daily. 03/25/20  Yes O'Neal, Cassie Freer, MD  carvedilol (COREG) 12.5 MG tablet Take 0.5 tablets (6.25 mg total) by mouth 2 (two) times daily with a meal. 10/14/20  Yes Hensel, Jamal Collin, MD  furosemide (LASIX) 40 MG tablet Take 1 tablet (40 mg total) by mouth daily as needed for fluid (swelling). 10/14/20  Yes Hensel, Jamal Collin, MD  insulin NPH-regular Human (NOVOLIN 70/30 RELION) (70-30) 100 UNIT/ML injection Inject 15-30 Units into the skin See admin instructions. Inject 30 units into the skin with breakfast and 15 units with supper   Yes [provider]  losartan (COZAAR) 25 MG tablet Take 1/2 tablet (12.5 mg total) by mouth daily. 08/20/20  Yes Welborn, Ryan, DO  metFORMIN (GLUCOPHAGE) 1000 MG tablet Take 1 tablet (1,000 mg total) by mouth daily. 09/18/20  Yes Hensel, Jamal Collin, MD  warfarin (COUMADIN) 5 MG tablet Take 1 tablet (5 mg total) by mouth daily. 08/19/20  Yes Welborn, Ryan, DO  Blood Glucose Monitoring Suppl (TRUE METRIX METER) DEVI Use to test blood sugar three times daily. 11/20/19   [name], MD  Blood Glucose Monitoring Suppl (TRUE METRIX METER) w/Device KIT USE AS DIRECTED 03/25/20   Hensel, Jamal Collin, MD  glucose blood (RELION TRUE METRIX TEST STRIPS) test strip Use to test blood sugar three times per day. 11/20/19   [name], MD  multivitamin (RENA-VIT) TABS tablet Take 1 tablet by mouth at bedtime. Patient not taking: Reported on 11/07/2020 04/09/20   Lattie Haw, MD  TRUEplus Lancets 33G MISC Use to test blood sugar three times per day. 11/20/19   [name], MD   VAS [place] ABI WITH/WO TBI  Result Date: 11/10/2020  LOWER EXTREMITY DOPPLER STUDY Patient Name:  Rodney [name]  Date of Exam:   11/10/2020 Medical Rec #: [id number]          Accession #:    [id number] Date of Birth: 1950/09/04         Patient Gender: M Patient Age:   69Y Exam Location:  [facility] Procedure:      VAS [place] ABI WITH/WO TBI Referring  Phys: [id number] Marty Heck --------------------------------------------------------------------------------  Indications: Ulceration of great toe, and peripheral artery disease. High Risk         Hypertension, hyperlipidemia, Diabetes, coronary artery Factors:          disease.  Vascular Interventions: History of right common iliac stent 08/05/20 and right                         SFA to below knee popliteal bypass 08/07/20. Left BKA. Limitations: Today's exam was limited due to an open wound and bandages. Performing Technologist: Sharion Dove RVS  Examination Guidelines: A complete evaluation includes at minimum, Doppler waveform signals and systolic blood pressure reading at the level of bilateral brachial, anterior tibial, and posterior tibial arteries, when vessel segments are accessible.  Bilateral testing is considered an integral part of a complete examination. Photoelectric Plethysmograph (PPG) waveforms and toe systolic pressure readings are included as required and additional duplex testing as needed. Limited examinations for reoccurring indications may be performed as noted.  ABI Findings: +---------+------------------+-----+------------------+--------+ Right    Rt Pressure (mmHg)IndexWaveform          Comment  +---------+------------------+-----+------------------+--------+ Brachial 165                    multiphasic                +---------+------------------+-----+------------------+--------+ PTA      139               0.84 monophasic                 +---------+------------------+-----+------------------+--------+ DP       80                0.48 monophasic                 +---------+------------------+-----+------------------+--------+ Great Toe                       ulceration/bandage         +---------+------------------+-----+------------------+--------+ +--------+------------------+-----+-----------+-------+ Left    Lt Pressure (mmHg)IndexWaveform    Comment +--------+------------------+-----+-----------+-------+ [id number]                    multiphasic        +--------+------------------+-----+-----------+-------+ PTA                                       BKA     +--------+------------------+-----+-----------+-------+ DP                                        BKA     +--------+------------------+-----+-----------+-------+ +-------+-----------+-----------+------------+------------+ ABI/TBIToday's ABIToday's TBIPrevious ABIPrevious TBI +-------+-----------+-----------+------------+------------+ Right  0.84                  0.57                     +-------+-----------+-----------+------------+------------+  Summary: Right: Resting right ankle-brachial index indicates mild right lower extremity arterial disease.  *See table(s) above for measurements and observations.  Electronically signed by Monica Martinez MD on 11/10/2020 at 2:54:50 PM.    Final    VAS [place] LOWER EXTREMITY ARTERIAL DUPLEX  Result Date: 11/10/2020 LOWER EXTREMITY ARTERIAL DUPLEX STUDY Patient Name:  Rodney [name]  Date of Exam:   11/10/2020 Medical Rec #: [id number]          Accession #:    [id number] Date of Birth: 11/15/1950         Patient Gender: M Patient Age:   69Y Exam Location:  [facility] Procedure:      VAS [place] LOWER EXTREMITY ARTERIAL DUPLEX Referring Phys: [id number] Marty Heck --------------------------------------------------------------------------------  Indications: Ulceration of right great toe, and peripheral artery disease. High Risk Factors: Hypertension, hyperlipidemia, Diabetes, coronary artery                    disease.  Vascular Interventions: History of right common iliac stent 08/05/20 and  right SFA to below knee popliteal bypass 08/07/20. Left                         BKA. Current ABI:            Right: 0.84 Comparison Study: No prior arterial duplex on file Performing Technologist: Sharion Dove RVS  Examination Guidelines: A complete evaluation includes B-mode imaging, spectral Doppler, color Doppler, and power Doppler as needed of all accessible portions of each vessel. Bilateral testing is considered an integral part of a complete examination. Limited examinations for reoccurring indications may be performed as noted.  +----------+--------+-----+--------+-----------+--------+ RIGHT     PSV cm/sRatioStenosisWaveform   Comments +----------+--------+-----+--------+-----------+--------+ CFA Prox  119                  multiphasic         +----------+--------+-----+--------+-----------+--------+ DFA       85                   multiphasic         +----------+--------+-----+--------+-----------+--------+ SFA Prox  21                                       +----------+--------+-----+--------+-----------+--------+ SFA Mid                occluded                    +----------+--------+-----+--------+-----------+--------+ TP Trunk  18                   monophasic          +----------+--------+-----+--------+-----------+--------+ ATA Mid   38                   monophasic          +----------+--------+-----+--------+-----------+--------+ ATA Distal35                   monophasic          +----------+--------+-----+--------+-----------+--------+ PTA Prox  32                   monophasic          +----------+--------+-----+--------+-----------+--------+ PTA Mid   32                   monophasic          +----------+--------+-----+--------+-----------+--------+ PTA Distal39                   monophasic          +----------+--------+-----+--------+-----------+--------+  Right Graft #1: SFA to below knee popliteal bypass with vein +------------------+--------+--------+----------+--------+                   PSV cm/sStenosisWaveform  Comments +------------------+--------+--------+----------+--------+ Inflow                                                +------------------+--------+--------+----------+--------+ Prox Anastomosis                                     +------------------+--------+--------+----------+--------+ Proximal Graft    37  monophasic         +------------------+--------+--------+----------+--------+ Mid Graft         48              monophasic         +------------------+--------+--------+----------+--------+ Distal Graft      84              monophasic         +------------------+--------+--------+----------+--------+ Distal Anastomosis                                   +------------------+--------+--------+----------+--------+ Outflow           18              monophasic         +------------------+--------+--------+----------+--------+ Graft is patent  Summary: Right: Patent bypass. Multiphasic waveforms noted in the common femoral and profunda arteries. Monophasic flow throughout the bypass and in the posterior tibial and anterior tibial arteries. Large area containing mixed echoes, measuring 4.42 X 7.88, is noted in the distal thigh and proximal popliteal fossa, cannot rule out hematoma.  See table(s) above for measurements and observations. Electronically signed by Monica Martinez MD on 11/10/2020 at 2:55:36 PM.    Final    - pertinent xrays, CT, MRI studies were reviewed and independently interpreted  Positive ROS: All other systems have been reviewed and were otherwise negative with the exception of those mentioned in the HPI and as above.  Physical Exam: General: Alert, no acute distress Psychiatric: Patient is competent for consent with normal mood and affect Lymphatic: No axillary or cervical lymphadenopathy Cardiovascular: No pedal edema Respiratory: No cyanosis, no use of accessory musculature GI: No organomegaly, abdomen is soft and non-tender    Images:  @ENCIMAGES @  Labs:  Lab Results  Component Value Date   HGBA1C 6.1 10/14/2020    HGBA1C 6.5 (A) 07/03/2020   HGBA1C 7.5 (H) 04/03/2020   CRP 3.6 (H) 11/10/2020   REPTSTATUS PENDING 11/07/2020   REPTSTATUS PENDING 11/07/2020   CULT  11/07/2020    NO GROWTH 4 DAYS Performed at [facility], [place] [address], [place], [place] [address]    CULT  11/07/2020    NO GROWTH 4 DAYS Performed at [facility] [address] [place], [place] [address]     Lab Results  Component Value Date   ALBUMIN 3.5 11/07/2020   ALBUMIN 2.5 (L) 08/14/2020   ALBUMIN 2.7 (L) 08/02/2020     CBC EXTENDED Latest Ref Rng & Units 11/11/2020 11/10/2020 11/09/2020  WBC 4.0 - 10.5 K/uL 7.0 8.0 8.6  RBC 4.22 - 5.81 MIL/uL 2.96(L) 3.07(L) 3.37(L)  HGB 13.0 - 17.0 g/dL 8.8(L) 9.1(L) 10.0(L)  HCT 39.0 - 52.0 % 27.7(L) 28.0(L) 31.4(L)  PLT 150 - 400 K/uL 261 270 275  NEUTROABS 1.7 - 7.7 K/uL - 5.3 6.3  LYMPHSABS 0.7 - 4.0 K/uL - 1.6 1.3    Neurologic: Patient does not have protective sensation bilateral lower extremities.   MUSCULOSKELETAL:   Skin: Examination patient has a ischemic gangrenous ulcer over the lateral border of the right great toe.  He does not have palpable pulses.  His most recent ankle-brachial indices 0.84 with monophasic flow.  Patient has no ascending cellulitis no purulent drainage.  Review of the MRI scan shows no abscess no osteomyelitis of the right great toe.  The MRI scan does show the ischemic ulcer.  Assessment: Assessment: Ischemic gangrenous ulcer right great toe.  Plan: Discussed with the patient recommendation to proceed with amputation of the right great toe.  I have discussed patient's case with Dr. Servando Snare vascular vein surgery and Dr. Donzetta Matters will proceed with endovascular evaluation on Wednesday with proceeding with amputation of the right great toe on Friday.  Thank you for the consult and the opportunity to see Rodney [name], [place] [phone] 12:20 PM

## 2020-11-11 NOTE — Assessment & Plan Note (Signed)
Start trial of low dose gabapentin. Monitor.

## 2020-11-11 NOTE — Plan of Care (Signed)
  Problem: Education: Goal: Knowledge of General Education information will improve Description: Including pain rating scale, medication(s)/side effects and non-pharmacologic comfort measures Outcome: Progressing   Problem: Health Behavior/Discharge Planning: Goal: Ability to manage health-related needs will improve Outcome: Not Progressing   Problem: Nutrition: Goal: Adequate nutrition will be maintained Outcome: Progressing

## 2020-11-11 NOTE — Progress Notes (Signed)
PROGRESS NOTE    Rodney [name]   JIR:[id number]  DOB: [birth year]  PCP: [name], MD    DOA: 11/07/2020 LOS: 4   Assessment & Plan   Principal Problem:   Osteomyelitis of right foot ([place]) Active Problems:   PVD (peripheral vascular disease) (HCC)   HFrEF (heart failure with reduced ejection fraction) (HCC)   Gangrene of toe of right foot ([place])   Essential hypertension   DM (diabetes mellitus), type 2 with neurological complications (HCC)   AF (paroxysmal atrial fibrillation) (HCC)   Supratherapeutic INR   Hypercholesteremia   Anemia in chronic illness   S/P BKA (below knee amputation) unilateral, left (HCC)   CAD (coronary artery disease)   CKD stage 3 due to type 2 diabetes mellitus ([place])   Obesity   History of deep vein thrombosis (DVT) of lower extremity   Open wound of right great toe   Peripheral neuropathy   Osteomyelitis of right foot ([place]) Due to diabetic foot infection with necrosis and wound infested with maggots. X-ray of the right foot showed findings suspicious for osteomyelitis.   MRI of right foot showed soft tissue ulceration in the plantar and lateral aspect of the right great toe without underlying focal abscess with no evidence of osteomyelitis or septic joint. Patient has been afebrile and without systemic sx's of infection --On empiric Vancomycin, Cefepime, Flagyl.   --Vascular surgery following: plan for angiogram Wednesday as it appears recent graft may be failing --Orthopedic surgery following: Dr Sharol Given plans for R great toe amputation Friday --ABI in right lower extremity arterial duplex pending.   --Expect he will need toe amputated but pt wants to avoid if at all possible.  AF (paroxysmal atrial fibrillation) (HCC) Chronically anticoagulated on Coumadin. Rate has been controlled. --Hold warfarin in case of surgery --Continue heparin drip --Continue amiodarone --Cardiology follow-up outpatient  Supratherapeutic INR INR 3.4 on  admission.  Resolved. Monitor daily. Warfarin dosing per phamacy.  History of deep vein thrombosis (DVT) of lower extremity DVT of right LE diagnosed in April 2022. On coumadin.   S/P BKA (below knee amputation) unilateral, left (HCC) Due to PVD.  No acute issues. Ambulatatory with prosthesis.  PVD (peripheral vascular disease) ([place]) S/p Right common iliac stent on 08/05/2020. S/p Right SFA to below-knee pop bypass with vein on 08/07/2020. Hx prior Left BKA. --Vascular following  DM (diabetes mellitus), type 2 with neurological complications (HCC) Continue 70/30 insulin along with CBGs with SSI  CKD stage 3 due to type 2 diabetes mellitus ([place]) Renal function stable. --Monitor BMP  Anemia in chronic illness Most likely due to renal disease.  Hbg near baseline, stable. --Monitor CBC  CAD (coronary artery disease) Continue Coreg, statin and losartan.   Outpatient follow-up with cardiology  Essential hypertension -Continue Coreg, losartan  Hypercholesteremia Continue statin  Obesity Body mass index is 31.28 kg/m. Complicates overall care and prognosis.  Recommend lifestyle modifications including physical activity and diet for weight loss and overall long-term health.   HFrEF (heart failure with reduced ejection fraction) (HCC) Stable, appears euvolemic. Echo in April 2022 - EF 50-55%, grade II diastolic dysfunction, severe bi-atrial dilation, moderate AS. --on Coreg, losartan --Hold home Lasix (40 mg daily) --I/O's & daily weights  Gangrene of toe of right foot ([place]) Due to PVD and diabetic infection.    Peripheral neuropathy Start trial of low dose gabapentin. Monitor.   Patient BMI: Body mass index is 31.25 kg/m.   DVT prophylaxis:    Diet:  Diet  Orders (From admission, onward)     Start     Ordered   11/07/20 1727  Diet heart healthy/carb modified Room service appropriate? Yes; Fluid consistency: Thin; Fluid restriction: 1200 mL Fluid  Diet  effective now       Question Answer Comment  Diet-HS Snack? Nothing   Room service appropriate? Yes   Fluid consistency: Thin   Fluid restriction: 1200 mL Fluid      11/07/20 1728              Code Status: Full Code   Brief Narrative / Hospital Course to Date:   70 year old male with history of peripheral vascular disease status post femoropopliteal bypass in April 2022 following which patient was also admitted the same month for cellulitis of right lower extremity treated with antibiotics, left BKA for necrotizing fasciitis in 2021, CAD, diabetes mellitus type 2, hypertension, paroxysmal A. fib on Coumadin, right lower extremity DVT diagnosed in April 2022 presented with right great toe worsening wound with maggots.  On presentation, x-ray revealed possible right great toe osteomyelitis.  He was started on IV antibiotics.  Orthopedics and subsequently vascular surgery were consulted.  Subjective 11/11/20    Pt this AM reports feeling okay.  He is not pleased to need toe amputation, but expresses understanding it can prevent him losing the rest of the foot or more.  Having nerve pain in legs and reports using GF's gabapentin at home which helps.    Disposition Plan & Communication   Status is: Inpatient  Remains inpatient appropriate because:Ongoing diagnostic testing needed not appropriate for outpatient work up  Dispo: The patient is from: Home              Anticipated d/c is to: Home              Patient currently is not medically stable to d/c.   Difficult to place patient No  Family Communication: wife at bedside on rounds    Consults, Procedures, Significant Events   Consultants:  Orthopedic surgery Vascular surgery  Procedures:  ABI's  Antimicrobials:  Anti-infectives (From admission, onward)    Start     Dose/Rate Route Frequency Ordered Stop   11/13/20 0327  vancomycin (VANCOREADY) IVPB 750 mg/150 mL        750 mg 150 mL/hr over 60 Minutes Intravenous  Every 36 hours 11/11/20 1612     11/08/20 1500  vancomycin (VANCOREADY) IVPB 1500 mg/300 mL  Status:  Discontinued        1,500 mg 150 mL/hr over 120 Minutes Intravenous Every 24 hours 11/07/20 2000 11/11/20 1612   11/08/20 1200  metroNIDAZOLE (FLAGYL) IVPB 500 mg        500 mg 100 mL/hr over 60 Minutes Intravenous Every 8 hours 11/08/20 1100     11/07/20 2200  ceFEPIme (MAXIPIME) 2 g in sodium chloride 0.9 % 100 mL IVPB        2 g 200 mL/hr over 30 Minutes Intravenous Every 8 hours 11/07/20 1728     11/07/20 1730  vancomycin (VANCOCIN) IVPB 1000 mg/200 mL premix  Status:  Discontinued        1,000 mg 200 mL/hr over 60 Minutes Intravenous  Once 11/07/20 1728 11/07/20 1831   11/07/20 1430  vancomycin (VANCOREADY) IVPB 2000 mg/400 mL        2,000 mg 200 mL/hr over 120 Minutes Intravenous  Once 11/07/20 1347 11/07/20 1727   11/07/20 1345  piperacillin-tazobactam (ZOSYN) IVPB 3.375 g  3.375 g 100 mL/hr over 30 Minutes Intravenous  Once 11/07/20 1344 11/07/20 1516         Micro    Objective   Vitals:   11/11/20 0458 11/11/20 0600 11/11/20 0746 11/11/20 1400  BP: 138/67  (!) 154/75 124/61  Pulse: 66  71 72  Resp: 18  17 17   Temp: 98.2 F (36.8 C)  98.2 F (36.8 C) 98.3 F (36.8 C)  TempSrc: Oral  Oral Oral  SpO2: 98%  97% 95%  Weight:  104.5 kg    Height:        Intake/Output Summary (Last 24 hours) at 11/11/2020 1833 Last data filed at 11/11/2020 1805 Gross per 24 hour  Intake 1678 ml  Output 2460 ml  Net -782 ml   Filed Weights   11/09/20 0500 11/10/20 0448 11/11/20 0600  Weight: 101.6 kg 104.6 kg 104.5 kg    Physical Exam:  General exam: awake, alert, no acute distress Respiratory system: normal respiratory effort, on room air. Cardiovascular system: RRR, no peripheral edema.   Central nervous system: A&O x3. normal speech, grossly non-focal exam Extremities: L BKA, R foot with clean/dry/intact dressing in pace, no peripheral edema  Labs   Data  Reviewed: I have personally reviewed following labs and imaging studies  CBC: Recent Labs  Lab 11/07/20 1436 11/08/20 0442 11/09/20 0525 11/10/20 0201 11/11/20 0047  WBC 9.4 7.7 8.6 8.0 7.0  NEUTROABS 6.2  --  6.3 5.3  --   HGB 11.1* 9.9* 10.0* 9.1* 8.8*  HCT 34.4* 31.1* 31.4* 28.0* 27.7*  MCV 92.2 94.5 93.2 91.2 93.6  PLT 326 274 275 270 836   Basic Metabolic Panel: Recent Labs  Lab 11/07/20 1436 11/08/20 0442 11/09/20 0525 11/10/20 0201 11/11/20 0047  NA 135 135 137 135 135  K 4.1 4.2 4.3 4.2 4.2  CL 103 103 106 104 108  CO2 26 27 24 24 23   GLUCOSE 109* 122* 112* 147* 139*  BUN 25* 21 22 30* 34*  CREATININE 1.11 1.17 1.11 1.16 1.16  CALCIUM 8.9 8.7* 8.7* 8.6* 8.5*  MG  --   --  1.8 2.0  --    GFR: Estimated Creatinine Clearance: 76.2 mL/min (by C-G formula based on SCr of 1.16 mg/dL). Liver Function Tests: Recent Labs  Lab 11/07/20 1436  AST 13*  ALT 13  ALKPHOS 70  BILITOT 0.6  PROT 7.3  ALBUMIN 3.5   No results for input(s): LIPASE, AMYLASE in the last 168 hours. No results for input(s): AMMONIA in the last 168 hours. Coagulation Profile: Recent Labs  Lab 11/07/20 1832 11/08/20 0442 11/09/20 0525 11/10/20 0201 11/11/20 0047  INR 1.9* 3.4* 1.6* 1.4* 1.3*   Cardiac Enzymes: No results for input(s): CKTOTAL, CKMB, CKMBINDEX, TROPONINI in the last 168 hours. BNP (last 3 results) No results for input(s): PROBNP in the last 8760 hours. HbA1C: No results for input(s): HGBA1C in the last 72 hours. CBG: Recent Labs  Lab 11/10/20 1715 11/10/20 2102 11/11/20 0753 11/11/20 1155 11/11/20 1710  GLUCAP 109* 106* 119* 82 111*   Lipid Profile: No results for input(s): CHOL, HDL, LDLCALC, TRIG, CHOLHDL, LDLDIRECT in the last 72 hours. Thyroid Function Tests: No results for input(s): TSH, T4TOTAL, FREET4, T3FREE, THYROIDAB in the last 72 hours. Anemia Panel: No results for input(s): VITAMINB12, FOLATE, FERRITIN, TIBC, IRON, RETICCTPCT in the last 72  hours. Sepsis Labs: Recent Labs  Lab 11/07/20 1436  LATICACIDVEN 0.8    Recent Results (from the past 240 hour(s))  Blood culture (routine x 2)     Status: None (Preliminary result)   Collection Time: 11/07/20  2:36 PM   Specimen: BLOOD  Result Value Ref Range Status   Specimen Description BLOOD BLOOD RIGHT FOREARM  Final   Special Requests   Final    BOTTLES DRAWN AEROBIC AND ANAEROBIC Blood Culture adequate volume   Culture   Final    NO GROWTH 4 DAYS Performed at [facility], [address] [place], [place] [address]    Report Status PENDING  Incomplete  Blood culture (routine x 2)     Status: None (Preliminary result)   Collection Time: 11/07/20  2:36 PM   Specimen: BLOOD RIGHT HAND  Result Value Ref Range Status   Specimen Description BLOOD RIGHT HAND  Final   Special Requests   Final    BOTTLES DRAWN AEROBIC AND ANAEROBIC Blood Culture adequate volume   Culture   Final    NO GROWTH 4 DAYS Performed at [facility], [place] [address] [place], [place] [address]    Report Status PENDING  Incomplete  SARS CORONAVIRUS 2 (TAT 6-24 HRS) Nasopharyngeal Nasopharyngeal Swab     Status: None   Collection Time: 11/07/20  4:24 PM   Specimen: Nasopharyngeal Swab  Result Value Ref Range Status   SARS Coronavirus 2 NEGATIVE NEGATIVE Final    Comment: (NOTE) SARS-CoV-2 target nucleic acids are NOT DETECTED.  The SARS-CoV-2 RNA is generally detectable in upper and lower respiratory specimens during the acute phase of infection. Negative results do not preclude SARS-CoV-2 infection, do not rule out co-infections with other pathogens, and should not be used as the sole basis for treatment or other patient management decisions. Negative results must be combined with clinical observations, patient history, and epidemiological information. The expected result is Negative.  Fact Sheet for Patients: SugarRoll.be  Fact Sheet for Healthcare  Providers: https://www.woods-mathews.com/  This test is not yet approved or cleared by the [place] FDA and  has been authorized for detection and/or diagnosis of SARS-CoV-2 by FDA under an Emergency Use Authorization (EUA). This EUA will remain  in effect (meaning this test can be used) for the duration of the COVID-19 declaration under Se ction 564(b)(1) of the Act, 21 U.S.C. section 360bbb-3(b)(1), unless the authorization is terminated or revoked sooner.  Performed at [facility], [place] [address] [place], [place] [address]       Imaging Studies   VAS [place] ABI WITH/WO TBI  Result Date: 11/10/2020  LOWER EXTREMITY DOPPLER STUDY Patient Name:  Rodney [name]  Date of Exam:   11/10/2020 Medical Rec #: [id number]          Accession #:    [id number] Date of Birth: November 08, 1950         Patient Gender: M Patient Age:   69Y Exam Location:  [facility] Procedure:      VAS [place] ABI WITH/WO TBI Referring Phys: [id number] Marty Heck --------------------------------------------------------------------------------  Indications: Ulceration of great toe, and peripheral artery disease. High Risk         Hypertension, hyperlipidemia, Diabetes, coronary artery Factors:          disease.  Vascular Interventions: History of right common iliac stent 08/05/20 and right                         SFA to below knee popliteal bypass 08/07/20. Left BKA. Limitations: Today's exam was limited due  to an open wound and bandages. Performing Technologist: Sharion Dove RVS  Examination Guidelines: A complete evaluation includes at minimum, Doppler waveform signals and systolic blood pressure reading at the level of bilateral brachial, anterior tibial, and posterior tibial arteries, when vessel segments are accessible. Bilateral testing is considered an integral part of a complete examination. Photoelectric Plethysmograph (PPG) waveforms and toe systolic pressure readings are included as required  and additional duplex testing as needed. Limited examinations for reoccurring indications may be performed as noted.  ABI Findings: +---------+------------------+-----+------------------+--------+ Right    Rt Pressure (mmHg)IndexWaveform          Comment  +---------+------------------+-----+------------------+--------+ Brachial 165                    multiphasic                +---------+------------------+-----+------------------+--------+ PTA      139               0.84 monophasic                 +---------+------------------+-----+------------------+--------+ DP       80                0.48 monophasic                 +---------+------------------+-----+------------------+--------+ Great Toe                       ulceration/bandage         +---------+------------------+-----+------------------+--------+ +--------+------------------+-----+-----------+-------+ Left    Lt Pressure (mmHg)IndexWaveform   Comment +--------+------------------+-----+-----------+-------+ [id number]                    multiphasic        +--------+------------------+-----+-----------+-------+ PTA                                       BKA     +--------+------------------+-----+-----------+-------+ DP                                        BKA     +--------+------------------+-----+-----------+-------+ +-------+-----------+-----------+------------+------------+ ABI/TBIToday's ABIToday's TBIPrevious ABIPrevious TBI +-------+-----------+-----------+------------+------------+ Right  0.84                  0.57                     +-------+-----------+-----------+------------+------------+  Summary: Right: Resting right ankle-brachial index indicates mild right lower extremity arterial disease.  *See table(s) above for measurements and observations.  Electronically signed by Monica Martinez MD on 11/10/2020 at 2:54:50 PM.    Final    VAS [place] LOWER EXTREMITY ARTERIAL  DUPLEX  Result Date: 11/10/2020 LOWER EXTREMITY ARTERIAL DUPLEX STUDY Patient Name:  Rodney [name]  Date of Exam:   11/10/2020 Medical Rec #: [id number]          Accession #:    [id number] Date of Birth: 11-08-1950         Patient Gender: M Patient Age:   69Y Exam Location:  [facility] Procedure:      VAS [place] LOWER EXTREMITY ARTERIAL DUPLEX Referring Phys: [id number] Marty Heck --------------------------------------------------------------------------------  Indications: Ulceration of right great toe, and peripheral artery disease. High Risk Factors: Hypertension, hyperlipidemia, Diabetes, coronary artery  disease.  Vascular Interventions: History of right common iliac stent 08/05/20 and                         right SFA to below knee popliteal bypass 08/07/20. Left                         BKA. Current ABI:            Right: 0.84 Comparison Study: No prior arterial duplex on file Performing Technologist: Sharion Dove RVS  Examination Guidelines: A complete evaluation includes B-mode imaging, spectral Doppler, color Doppler, and power Doppler as needed of all accessible portions of each vessel. Bilateral testing is considered an integral part of a complete examination. Limited examinations for reoccurring indications may be performed as noted.  +----------+--------+-----+--------+-----------+--------+ RIGHT     PSV cm/sRatioStenosisWaveform   Comments +----------+--------+-----+--------+-----------+--------+ CFA Prox  119                  multiphasic         +----------+--------+-----+--------+-----------+--------+ DFA       85                   multiphasic         +----------+--------+-----+--------+-----------+--------+ SFA Prox  21                                       +----------+--------+-----+--------+-----------+--------+ SFA Mid                occluded                    +----------+--------+-----+--------+-----------+--------+ TP Trunk   18                   monophasic          +----------+--------+-----+--------+-----------+--------+ ATA Mid   38                   monophasic          +----------+--------+-----+--------+-----------+--------+ ATA Distal35                   monophasic          +----------+--------+-----+--------+-----------+--------+ PTA Prox  32                   monophasic          +----------+--------+-----+--------+-----------+--------+ PTA Mid   32                   monophasic          +----------+--------+-----+--------+-----------+--------+ PTA Distal39                   monophasic          +----------+--------+-----+--------+-----------+--------+  Right Graft #1: SFA to below knee popliteal bypass with vein +------------------+--------+--------+----------+--------+                   PSV cm/sStenosisWaveform  Comments +------------------+--------+--------+----------+--------+ Inflow                                               +------------------+--------+--------+----------+--------+ Prox Anastomosis                                     +------------------+--------+--------+----------+--------+  Proximal Graft    37              monophasic         +------------------+--------+--------+----------+--------+ Mid Graft         48              monophasic         +------------------+--------+--------+----------+--------+ Distal Graft      84              monophasic         +------------------+--------+--------+----------+--------+ Distal Anastomosis                                   +------------------+--------+--------+----------+--------+ Outflow           18              monophasic         +------------------+--------+--------+----------+--------+ Graft is patent  Summary: Right: Patent bypass. Multiphasic waveforms noted in the common femoral and profunda arteries. Monophasic flow throughout the bypass and in the posterior tibial and anterior  tibial arteries. Large area containing mixed echoes, measuring 4.42 X 7.88, is noted in the distal thigh and proximal popliteal fossa, cannot rule out hematoma.  See table(s) above for measurements and observations. Electronically signed by Monica Martinez MD on 11/10/2020 at 2:55:36 PM.    Final      Medications   Scheduled Meds:  amiodarone  200 mg Oral Daily   atorvastatin  80 mg Oral Daily   carvedilol  6.25 mg Oral BID WC   gabapentin  200 mg Oral TID   insulin aspart  0-9 Units Subcutaneous TID WC   insulin aspart protamine- aspart  15 Units Subcutaneous Q supper   insulin aspart protamine- aspart  30 Units Subcutaneous Q breakfast   losartan  12.5 mg Oral Daily   multivitamin with minerals  1 tablet Oral Daily   nutrition supplement (JUVEN)  1 packet Oral BID BM   Ensure Max Protein  11 oz Oral BID   Continuous Infusions:  ceFEPime (MAXIPIME) IV 2 g (11/11/20 1336)   heparin 1,800 Units/hr (11/11/20 1126)   metronidazole 500 mg (11/11/20 1119)   [START ON 11/13/2020] vancomycin         LOS: 4 days    Time spent: 25 minutes with > 50% spent at bedside and in coordination of care.    Ezekiel Slocumb, DO [facility]  11/11/2020, 6:33 PM      If 7PM-7AM, please contact night-coverage. How to contact the [facility] Attending or Consulting provider [place] or covering provider during after hours [place], for this patient?    Check the care team in [facility] and look for a) attending/consulting [facility] provider listed and b) the [facility] team listed Log into [email or web address] and use [place]'s universal password to access. If you do not have the password, please contact the hospital operator. Locate the [facility] provider you are looking for under [facility] and page to a number that you can be directly reached. If you still have difficulty reaching the provider, please page the [facility] (Director on Call) for the Hospitalists listed on amion for assistance.

## 2020-11-11 NOTE — Progress Notes (Signed)
Pharmacy Antibiotic Note  Rodney [name] is a 70 y.o. male admitted on 11/07/2020 with cellulitis, R great toe infection x 2 months, maggots noted in wound by GF.  Pharmacy has been consulted for Vancomycin & Cefepime dosing.  Peak of 50, trough of 16, AUC elevated Change to 750 mg IV q36hr for new AUC of 476  Plan:  Change Vanc 750 mg IV q36h Continue Cefepime 2gm IV q8h Monitor renal fx, adjust abx as needed Vascular following, patient not amicable to amputation at this time  Height: 6' (182.9 cm) Weight: 104.5 kg (230 lb 6.1 oz) IBW/kg (Calculated) : 77.6  Temp (24hrs), Avg:98.1 F (36.7 C), Min:97.8 F (36.6 C), Max:98.3 F (36.8 C)  Recent Labs  Lab 11/07/20 1436 11/08/20 0442 11/09/20 0525 11/10/20 0201 11/10/20 1800 11/11/20 0047 11/11/20 1443  WBC 9.4 7.7 8.6 8.0  --  7.0  --   CREATININE 1.11 1.17 1.11 1.16  --  1.16  --   LATICACIDVEN 0.8  --   --   --   --   --   --   VANCOTROUGH  --   --   --   --   --   --  16  VANCOPEAK  --   --   --   --  50*  --   --      Estimated Creatinine Clearance: 76.2 mL/min (by C-G formula based on SCr of 1.16 mg/dL).    No Known Allergies  Antimicrobials this admission: 7/14 Zosyn x1 7/14 Cefepime >>  7/14 Vancomycin >>  7/15 Flagyl>>  Dose adjustments this admission:  Microbiology results: 7/14 BCx: sent  Thank you for allowing pharmacy to be a part of this patient's care.  Alanda Slim, PharmD, [facility] Clinical Pharmacist Please see AMION for all Pharmacists' Contact Phone Numbers 11/11/2020, 4:12 PM

## 2020-11-11 NOTE — Progress Notes (Signed)
[place] for Warfarin>heparin Indication: atrial fibrillation  No Known Allergies  Patient Measurements: Height: 6' (182.9 cm) Weight: 104.5 kg (230 lb 6.1 oz) IBW/kg (Calculated) : 77.6  Vital Signs: Temp: 98.2 F (36.8 C) (07/18 0746) Temp Source: Oral (07/18 0746) BP: 154/75 (07/18 0746) Pulse Rate: 71 (07/18 0746)  Labs: Recent Labs    11/09/20 0525 11/10/20 0201 11/10/20 1257 11/11/20 0047  HGB 10.0* 9.1*  --  8.8*  HCT 31.4* 28.0*  --  27.7*  PLT 275 270  --  261  LABPROT 18.5* 16.8*  --  16.1*  INR 1.6* 1.4*  --  1.3*  HEPARINUNFRC  --  0.42 0.41 0.49  CREATININE 1.11 1.16  --  1.16    Estimated Creatinine Clearance: 76.2 mL/min (by C-G formula based on SCr of 1.16 mg/dL).  Assessment:  4 yoM with R great toe infection x 2 months, Warfarin 5mg  qd for Afib, LD 7/13, recent DVT 08/14/20. Baseline INR 1.9  INR 3.4>1.6 - warfarin on hold for toe amputation scheduled 11/13/20.   Heparin level continues to be therapeutic at 0.49, on 1800 units/hr. Hgb 8.8, plt 261. No s/sx of bleeding or infusion issues.   Goal of Therapy:  Heparin level: 0.3-0.7 INR 2-3 Monitor platelets by anticoagulation protocol: Yes   Plan:  Continue heparin infusion at 1800 units/hr  Monitor daily HL, CBC, and for s/sx of bleeding  Pauletta Browns, Pharm.D. PGY-1 Ambulatory Care Resident [phone] 11/11/2020 8:13 AM

## 2020-11-12 DIAGNOSIS — I70261 Atherosclerosis of native arteries of extremities with gangrene, right leg: Secondary | ICD-10-CM

## 2020-11-12 DIAGNOSIS — M869 Osteomyelitis, unspecified: Secondary | ICD-10-CM | POA: Diagnosis not present

## 2020-11-12 LAB — GLUCOSE, CAPILLARY
Glucose-Capillary: 144 mg/dL — ABNORMAL HIGH (ref 70–99)
Glucose-Capillary: 137 mg/dL — ABNORMAL HIGH (ref 70–99)
Glucose-Capillary: 134 mg/dL — ABNORMAL HIGH (ref 70–99)
Glucose-Capillary: 135 mg/dL — ABNORMAL HIGH (ref 70–99)

## 2020-11-12 LAB — CBC
HCT: 25.7 % — ABNORMAL LOW (ref 39.0–52.0)
Hemoglobin: 8.5 g/dL — ABNORMAL LOW (ref 13.0–17.0)
MCH: 30.6 pg (ref 26.0–34.0)
MCHC: 33.1 g/dL (ref 30.0–36.0)
MCV: 92.4 fL (ref 80.0–100.0)
Platelets: 257 10*3/uL (ref 150–400)
RBC: 2.78 MIL/uL — ABNORMAL LOW (ref 4.22–5.81)
RDW: 14 % (ref 11.5–15.5)
WBC: 7.3 10*3/uL (ref 4.0–10.5)
nRBC: 0 % (ref 0.0–0.2)

## 2020-11-12 LAB — CULTURE, BLOOD (ROUTINE X 2)
Culture: NO GROWTH
Culture: NO GROWTH
Special Requests: ADEQUATE
Special Requests: ADEQUATE

## 2020-11-12 LAB — HEPARIN LEVEL (UNFRACTIONATED): Heparin Unfractionated: 0.47 IU/mL (ref 0.30–0.70)

## 2020-11-12 LAB — PROTIME-INR
INR: 1.2 (ref 0.8–1.2)
Prothrombin Time: 15.3 seconds — ABNORMAL HIGH (ref 11.4–15.2)

## 2020-11-12 MED ORDER — FUROSEMIDE 40 MG PO TABS
40.0000 mg | ORAL_TABLET | Freq: Every day | ORAL | Status: DC
  Administered 2020-11-12: 40 mg via ORAL
  Filled 2020-11-12: qty 1

## 2020-11-12 MED ORDER — VANCOMYCIN HCL IN DEXTROSE 1-5 GM/200ML-% IV SOLN
1000.0000 mg | INTRAVENOUS | Status: AC
  Administered 2020-11-12 – 2020-11-15 (×4): 1000 mg via INTRAVENOUS
  Filled 2020-11-12 (×5): qty 200

## 2020-11-12 MED ORDER — DM-GUAIFENESIN ER 30-600 MG PO TB12
1.0000 | ORAL_TABLET | Freq: Two times a day (BID) | ORAL | Status: DC
  Administered 2020-11-12 – 2020-11-14 (×4): 1 via ORAL
  Filled 2020-11-12 (×5): qty 1

## 2020-11-12 NOTE — Progress Notes (Addendum)
ANTICOAGULATION CONSULT NOTE - Follow Up Consult  Pharmacy Consult for Heparin + Vancomycin Indication: hx afib and DVT Ischemic gangrenous ulcer right great toe.  No Known Allergies  Patient Measurements: Height: 6' (182.9 cm) Weight: 104.5 kg (230 lb 6.1 oz) IBW/kg (Calculated) : 77.6 Heparin Dosing Weight:   Vital Signs: Temp: 97.8 F (36.6 C) (07/19 0755) Temp Source: Oral (07/19 0755) BP: 136/70 (07/19 0755) Pulse Rate: 72 (07/19 0755)  Labs: Recent Labs    11/10/20 0201 11/10/20 1257 11/11/20 0047 11/12/20 0019  HGB 9.1*  --  8.8* 8.5*  HCT 28.0*  --  27.7* 25.7*  PLT 270  --  261 257  LABPROT 16.8*  --  16.1* 15.3*  INR 1.4*  --  1.3* 1.2  HEPARINUNFRC 0.42 0.41 0.49 0.47  CREATININE 1.16  --  1.16  --     Estimated Creatinine Clearance: 76.2 mL/min (by C-G formula based on SCr of 1.16 mg/dL).   Assessment:  Anticoag: hx afib and DVT on 08/14/20 on warf PTA - 7/15:  holding warf for toe amputation, started heparin drip - Hep level 0.47 in goal. Hgb trending down 8.5. Plts WNL  ID: Ischemic gangrenous ulcer right great toe. - hx left BKA; post right common iliac stent on 08/05/2020 and then right SFA to below-knee popliteal bypass with vein on 08/07/2020 - 7/15 MRI: focal abscess, no osteo, no septic joint  Afebrile, WBC 7.3  Antimicrobials this admission: 7/14 Zosyn x1 7/14 Cefepime >>  7/14 Vancomycin >>  7/15 flagyl >>  Dose adjustments this admission: - 7/15: start vanc 1500mg  q24 ( SCr 1.17; AUC 504.6 w/ Vd 0.5) - 7/18: VP 50, VT 16: AUC 709: Chg to Vanco 1g/24h for AUC 472  Microbiology results: 7/14 BCx2:  neg 7/14 HIV NR  Goal of Therapy:  Heparin level 0.3-0.7 units/ml Monitor platelets by anticoagulation protocol: Yes Vanco AUC 400-550   Plan:  Heparin @ 1800 units/hr; Daily HL and CBC  Adjust Vancomycin to 1g IV q24h (resume tonight).  Cefepime 2g IV q8hr remains ok for renal function.    Caeden Foots S. Alford Highland, PharmD,  BCPS Clinical Staff Pharmacist [email or web address]  Alford Highland, [place] 11/12/2020,8:28 AM

## 2020-11-12 NOTE — Consult Note (Signed)
   [name] Inpatient Consult   11/12/2020  [name] Dec 04, 1950 [id number]  [facility] [[facility]] Patient: Rodney [name]  Patient was screened for [name] services. Patient will have the transition of care call conducted by the primary care provider. This patient is also in an [name] practice which has a chronic disease management [name] Care Management team.  Plan: Will make referral to the [facility] if needs are assessed for chronic care management pending patient's disposition. Please contact for further questions,  [name], RN BSN [facility] Liaison  [phone] business mobile phone Toll free office [phone]  Fax number: [phone] [email or web address]

## 2020-11-12 NOTE — Progress Notes (Addendum)
  Progress Note    11/12/2020 7:44 AM Hospital Day 4  Subjective:  no complaints  afebrile  Vitals:   11/11/20 2113 11/12/20 0437  BP: 127/65 132/67  Pulse: 67 73  Resp: 18 17  Temp: 98.3 F (36.8 C) 98.1 F (36.7 C)  SpO2: 98% 96%    Physical Exam: General  no distress Lungs:  non labored Extremities:  right foot bandaged  CBC    Component Value Date/Time   WBC 7.3 11/12/2020 0019   RBC 2.78 (L) 11/12/2020 0019   HGB 8.5 (L) 11/12/2020 0019   HGB 10.7 (L) 08/29/2020 1501   HCT 25.7 (L) 11/12/2020 0019   HCT 33.0 (L) 08/29/2020 1501   PLT 257 11/12/2020 0019   PLT 371 08/29/2020 1501   MCV 92.4 11/12/2020 0019   MCV 89 08/29/2020 1501   MCH 30.6 11/12/2020 0019   MCHC 33.1 11/12/2020 0019   RDW 14.0 11/12/2020 0019   RDW 13.9 08/29/2020 1501   LYMPHSABS 1.6 11/10/2020 0201   MONOABS 0.8 11/10/2020 0201   EOSABS 0.3 11/10/2020 0201   BASOSABS 0.1 11/10/2020 0201    BMET    Component Value Date/Time   NA 135 11/11/2020 0047   NA 136 09/05/2020 1442   K 4.2 11/11/2020 0047   CL 108 11/11/2020 0047   CO2 23 11/11/2020 0047   GLUCOSE 139 (H) 11/11/2020 0047   BUN 34 (H) 11/11/2020 0047   BUN 29 (H) 09/05/2020 1442   CREATININE 1.16 11/11/2020 0047   CREATININE 1.19 10/09/2015 1554   CALCIUM 8.5 (L) 11/11/2020 0047   GFRNONAA >60 11/11/2020 0047   GFRNONAA 65 10/09/2015 1554   GFRAA 52 (L) 05/01/2020 1632   GFRAA 75 10/09/2015 1554    INR    Component Value Date/Time   INR 1.2 11/12/2020 0019     Intake/Output Summary (Last 24 hours) at 11/12/2020 0744 Last data filed at 11/12/2020 9892 Gross per 24 hour  Intake 2071.23 ml  Output 1960 ml  Net 111.23 ml     Assessment/Plan:  70 y.o. male with hx of right leg revascularization now with gangrenous changes to right foot Hospital Day 4  -Dr. Donzetta Matters discussed with pt about angiogram to evaluated right leg.   -discussed with pt about evaluating blood flow to help toe amp site heal.   -npo  after MN/consent/labs ordered   Leontine Locket, PA-C [facility] [phone] 11/12/2020 7:44 AM  I have independently examined patient and agree with PA assessment and plan above. Plan for angio tomorrow from left common femoral approach to evaluate recent bypass with monophasic flow. Toe amp with Dr. Sharol Given on Friday this week. INR 1.2 currently on heparin.   Remer Couse C. Donzetta Matters, MD [facility] [place] Office: [phone] Pager: [phone]

## 2020-11-12 NOTE — Progress Notes (Addendum)
PROGRESS NOTE    Rodney [name]   NID:[id number]  DOB: [birth year]  PCP: [name], MD    DOA: 11/07/2020 LOS: 5   Assessment & Plan   Principal Problem:   Osteomyelitis of right foot ([place]) Active Problems:   PVD (peripheral vascular disease) (HCC)   HFrEF (heart failure with reduced ejection fraction) (HCC)   Gangrene of toe of right foot ([place])   Essential hypertension   DM (diabetes mellitus), type 2 with neurological complications (HCC)   AF (paroxysmal atrial fibrillation) (HCC)   Supratherapeutic INR   Hypercholesteremia   Anemia in chronic illness   S/P BKA (below knee amputation) unilateral, left (HCC)   CAD (coronary artery disease)   CKD stage 3 due to type 2 diabetes mellitus ([place])   Obesity   History of deep vein thrombosis (DVT) of lower extremity   Open wound of right great toe   Peripheral neuropathy   Osteomyelitis of right foot ([place]) Due to diabetic foot infection with necrosis and wound infested with maggots. X-ray of the right foot showed findings suspicious for osteomyelitis.   MRI of right foot showed soft tissue ulceration in the plantar and lateral aspect of the right great toe without underlying focal abscess with no evidence of osteomyelitis or septic joint. Patient has been afebrile and without systemic sx's of infection --On broad spectrum coverage with empiric Vancomycin, Cefepime, Flagyl.   --Vascular surgery following: plan for angiogram Wednesday as it appears recent graft may be failing --Orthopedic surgery following: Dr Sharol Given plans for R great toe amputation Friday --ABI in right lower extremity arterial duplex pending.   --Expect he will need toe amputated but pt wants to avoid if at all possible.  AF (paroxysmal atrial fibrillation) (HCC) Chronically anticoagulated on Coumadin. Rate has been controlled. --Hold warfarin in case of surgery --Continue heparin drip --Continue amiodarone --Cardiology follow-up  outpatient  Supratherapeutic INR INR 3.4 on admission.  Resolved. Monitor daily. Warfarin dosing per phamacy.  History of deep vein thrombosis (DVT) of lower extremity DVT of right LE diagnosed in April 2022. On coumadin.   S/P BKA (below knee amputation) unilateral, left (HCC) Due to PVD.  No acute issues. Ambulatatory with prosthesis.  PVD (peripheral vascular disease) ([place]) S/p Right common iliac stent on 08/05/2020. S/p Right SFA to below-knee pop bypass with vein on 08/07/2020. Hx prior Left BKA. --Vascular following  DM (diabetes mellitus), type 2 with neurological complications (HCC) Continue 70/30 insulin along with CBGs with SSI  CKD stage 3 due to type 2 diabetes mellitus ([place]) Renal function stable. --Monitor BMP  Anemia in chronic illness Most likely due to renal disease.  Hbg near baseline, stable. --Monitor CBC  CAD (coronary artery disease) Continue Coreg, statin and losartan.   Outpatient follow-up with cardiology  Essential hypertension -Continue Coreg, losartan  Hypercholesteremia Continue statin  Obesity Body mass index is 31.28 kg/m. Complicates overall care and prognosis.  Recommend lifestyle modifications including physical activity and diet for weight loss and overall long-term health.   HFrEF (heart failure with reduced ejection fraction) (HCC) Stable, appears euvolemic. Echo in April 2022 - EF 50-55%, grade II diastolic dysfunction, severe bi-atrial dilation, moderate AS. --on Coreg, losartan --Resumed home Lasix 40 mg daily, initially held --I/O's & daily weights  Gangrene of toe of right foot ([place]) Due to PVD and diabetic infection.    Peripheral neuropathy Start trial of low dose gabapentin. Monitor.   Patient BMI: Body mass index is 31.25 kg/m.   DVT prophylaxis:  Diet:  Diet Orders (From admission, onward)     Start     Ordered   11/13/20 0001  Diet NPO time specified Except for: Sips with Meds  Diet effective  midnight       Comments: Hold hypoglycemics and diuretic medications  Question:  Except for  Answer:  Ferrel Logan with Meds   11/12/20 0748   11/07/20 1727  Diet heart healthy/carb modified Room service appropriate? Yes; Fluid consistency: Thin; Fluid restriction: 1200 mL Fluid  Diet effective now       Question Answer Comment  Diet-HS Snack? Nothing   Room service appropriate? Yes   Fluid consistency: Thin   Fluid restriction: 1200 mL Fluid      11/07/20 1728              Code Status: Full Code   Brief Narrative / Hospital Course to Date:   70 year old male with history of peripheral vascular disease status post femoropopliteal bypass in April 2022 following which patient was also admitted the same month for cellulitis of right lower extremity treated with antibiotics, left BKA for necrotizing fasciitis in 2021, CAD, diabetes mellitus type 2, hypertension, paroxysmal A. fib on Coumadin, right lower extremity DVT diagnosed in April 2022 presented with right great toe worsening wound with maggots.  On presentation, x-ray revealed possible right great toe osteomyelitis.  He was started on IV antibiotics.  Orthopedics and subsequently vascular surgery were consulted.  Subjective 11/12/20    Pt reports feeling okay.  No acute complaints.  Awaiting angio tomorrow and surgery Friday.  Denies pain, F/C, SOB, CP or complaints.  Disposition Plan & Communication   Status is: Inpatient  Remains inpatient appropriate because:Ongoing diagnostic testing needed not appropriate for outpatient work up  Dispo: The patient is from: Home              Anticipated d/c is to: Home              Patient currently is not medically stable to d/c.   Difficult to place patient No  Family Communication: wife at bedside on rounds 7/17   Consults, Procedures, Significant Events   Consultants:  Orthopedic surgery Vascular surgery  Procedures:  ABI's  Antimicrobials:  Anti-infectives (From admission,  onward)    Start     Dose/Rate Route Frequency Ordered Stop   11/13/20 0327  vancomycin (VANCOREADY) IVPB 750 mg/150 mL  Status:  Discontinued        750 mg 150 mL/hr over 60 Minutes Intravenous Every 36 hours 11/11/20 1612 11/12/20 0824   11/12/20 2200  vancomycin (VANCOCIN) IVPB 1000 mg/200 mL premix        1,000 mg 200 mL/hr over 60 Minutes Intravenous Every 24 hours 11/12/20 0825     11/08/20 1500  vancomycin (VANCOREADY) IVPB 1500 mg/300 mL  Status:  Discontinued        1,500 mg 150 mL/hr over 120 Minutes Intravenous Every 24 hours 11/07/20 2000 11/11/20 1612   11/08/20 1200  metroNIDAZOLE (FLAGYL) IVPB 500 mg        500 mg 100 mL/hr over 60 Minutes Intravenous Every 8 hours 11/08/20 1100     11/07/20 2200  ceFEPIme (MAXIPIME) 2 g in sodium chloride 0.9 % 100 mL IVPB        2 g 200 mL/hr over 30 Minutes Intravenous Every 8 hours 11/07/20 1728     11/07/20 1730  vancomycin (VANCOCIN) IVPB 1000 mg/200 mL premix  Status:  Discontinued  1,000 mg 200 mL/hr over 60 Minutes Intravenous  Once 11/07/20 1728 11/07/20 1831   11/07/20 1430  vancomycin (VANCOREADY) IVPB 2000 mg/400 mL        2,000 mg 200 mL/hr over 120 Minutes Intravenous  Once 11/07/20 1347 11/07/20 1727   11/07/20 1345  piperacillin-tazobactam (ZOSYN) IVPB 3.375 g        3.375 g 100 mL/hr over 30 Minutes Intravenous  Once 11/07/20 1344 11/07/20 1516         Micro    Objective   Vitals:   11/11/20 2113 11/12/20 0437 11/12/20 0755 11/12/20 1225  BP: 127/65 132/67 136/70 135/69  Pulse: 67 73 72 73  Resp: 18 17 17 20   Temp: 98.3 F (36.8 C) 98.1 F (36.7 C) 97.8 F (36.6 C) 98.1 F (36.7 C)  TempSrc: Oral Oral Oral Oral  SpO2: 98% 96% 97% 97%  Weight:      Height:        Intake/Output Summary (Last 24 hours) at 11/12/2020 1436 Last data filed at 11/12/2020 1400 Gross per 24 hour  Intake 1951.23 ml  Output 2920 ml  Net -968.77 ml   Filed Weights   11/09/20 0500 11/10/20 0448 11/11/20 0600   Weight: 101.6 kg 104.6 kg 104.5 kg    Physical Exam:  General exam: awake, alert, no acute distress Respiratory system: normal respiratory effort, on room air. Cardiovascular system: RRR, no peripheral edema.   Central nervous system: A&O x3. normal speech, grossly non-focal exam Extremities: L BKA, R foot with clean/dry/intact dressing in pace, no peripheral edema  Labs   Data Reviewed: I have personally reviewed following labs and imaging studies  CBC: Recent Labs  Lab 11/07/20 1436 11/08/20 0442 11/09/20 0525 11/10/20 0201 11/11/20 0047 11/12/20 0019  WBC 9.4 7.7 8.6 8.0 7.0 7.3  NEUTROABS 6.2  --  6.3 5.3  --   --   HGB 11.1* 9.9* 10.0* 9.1* 8.8* 8.5*  HCT 34.4* 31.1* 31.4* 28.0* 27.7* 25.7*  MCV 92.2 94.5 93.2 91.2 93.6 92.4  PLT 326 274 275 270 261 532   Basic Metabolic Panel: Recent Labs  Lab 11/07/20 1436 11/08/20 0442 11/09/20 0525 11/10/20 0201 11/11/20 0047  NA 135 135 137 135 135  K 4.1 4.2 4.3 4.2 4.2  CL 103 103 106 104 108  CO2 26 27 24 24 23   GLUCOSE 109* 122* 112* 147* 139*  BUN 25* 21 22 30* 34*  CREATININE 1.11 1.17 1.11 1.16 1.16  CALCIUM 8.9 8.7* 8.7* 8.6* 8.5*  MG  --   --  1.8 2.0  --    GFR: Estimated Creatinine Clearance: 76.2 mL/min (by C-G formula based on SCr of 1.16 mg/dL). Liver Function Tests: Recent Labs  Lab 11/07/20 1436  AST 13*  ALT 13  ALKPHOS 70  BILITOT 0.6  PROT 7.3  ALBUMIN 3.5   No results for input(s): LIPASE, AMYLASE in the last 168 hours. No results for input(s): AMMONIA in the last 168 hours. Coagulation Profile: Recent Labs  Lab 11/08/20 0442 11/09/20 0525 11/10/20 0201 11/11/20 0047 11/12/20 0019  INR 3.4* 1.6* 1.4* 1.3* 1.2   Cardiac Enzymes: No results for input(s): CKTOTAL, CKMB, CKMBINDEX, TROPONINI in the last 168 hours. BNP (last 3 results) No results for input(s): PROBNP in the last 8760 hours. HbA1C: No results for input(s): HGBA1C in the last 72 hours. CBG: Recent Labs  Lab  11/11/20 1155 11/11/20 1710 11/11/20 2110 11/12/20 0801 11/12/20 1228  GLUCAP 82 111* 123* 144* 137*  Lipid Profile: No results for input(s): CHOL, HDL, LDLCALC, TRIG, CHOLHDL, LDLDIRECT in the last 72 hours. Thyroid Function Tests: No results for input(s): TSH, T4TOTAL, FREET4, T3FREE, THYROIDAB in the last 72 hours. Anemia Panel: No results for input(s): VITAMINB12, FOLATE, FERRITIN, TIBC, IRON, RETICCTPCT in the last 72 hours. Sepsis Labs: Recent Labs  Lab 11/07/20 1436  LATICACIDVEN 0.8    Recent Results (from the past 240 hour(s))  Blood culture (routine x 2)     Status: None   Collection Time: 11/07/20  2:36 PM   Specimen: BLOOD  Result Value Ref Range Status   Specimen Description BLOOD BLOOD RIGHT FOREARM  Final   Special Requests   Final    BOTTLES DRAWN AEROBIC AND ANAEROBIC Blood Culture adequate volume   Culture   Final    NO GROWTH 5 DAYS Performed at [facility], [address], [place], [place] [address]    Report Status 11/12/2020 FINAL  Final  Blood culture (routine x 2)     Status: None   Collection Time: 11/07/20  2:36 PM   Specimen: BLOOD RIGHT HAND  Result Value Ref Range Status   Specimen Description BLOOD RIGHT HAND  Final   Special Requests   Final    BOTTLES DRAWN AEROBIC AND ANAEROBIC Blood Culture adequate volume   Culture   Final    NO GROWTH 5 DAYS Performed at [facility], [place] [address], [place], [place] [address]    Report Status 11/12/2020 FINAL  Final  SARS CORONAVIRUS 2 (TAT 6-24 HRS) Nasopharyngeal Nasopharyngeal Swab     Status: None   Collection Time: 11/07/20  4:24 PM   Specimen: Nasopharyngeal Swab  Result Value Ref Range Status   SARS Coronavirus 2 NEGATIVE NEGATIVE Final    Comment: (NOTE) SARS-CoV-2 target nucleic acids are NOT DETECTED.  The SARS-CoV-2 RNA is generally detectable in upper and lower respiratory specimens during the acute phase of infection. Negative results do not preclude SARS-CoV-2  infection, do not rule out co-infections with other pathogens, and should not be used as the sole basis for treatment or other patient management decisions. Negative results must be combined with clinical observations, patient history, and epidemiological information. The expected result is Negative.  Fact Sheet for Patients: SugarRoll.be  Fact Sheet for Healthcare Providers: https://www.woods-mathews.com/  This test is not yet approved or cleared by the [place] FDA and  has been authorized for detection and/or diagnosis of SARS-CoV-2 by FDA under an Emergency Use Authorization (EUA). This EUA will remain  in effect (meaning this test can be used) for the duration of the COVID-19 declaration under Se ction 564(b)(1) of the Act, 21 U.S.C. section 360bbb-3(b)(1), unless the authorization is terminated or revoked sooner.  Performed at [facility], [place] [address], [place], [place] [address]       Imaging Studies   No results found.   Medications   Scheduled Meds:  amiodarone  200 mg Oral Daily   atorvastatin  80 mg Oral Daily   carvedilol  6.25 mg Oral BID WC   dextromethorphan-guaiFENesin  1 tablet Oral BID   furosemide  40 mg Oral Daily   gabapentin  200 mg Oral TID   insulin aspart  0-9 Units Subcutaneous TID WC   insulin aspart protamine- aspart  15 Units Subcutaneous Q supper   insulin aspart protamine- aspart  30 Units Subcutaneous Q breakfast   losartan  12.5 mg Oral Daily   multivitamin with minerals  1 tablet Oral Daily   nicotine  7 mg Transdermal Daily   nutrition supplement (JUVEN)  1 packet Oral BID BM   Ensure Max Protein  11 oz Oral BID   Continuous Infusions:  ceFEPime (MAXIPIME) IV 2 g (11/12/20 1359)   heparin 1,800 Units/hr (11/12/20 9379)   metronidazole 500 mg (11/12/20 1210)   vancomycin         LOS: 5 days    Time spent: 25 minutes with > 50% spent at bedside and in coordination of  care.    Ezekiel Slocumb, DO [facility]  11/12/2020, 2:36 PM      If 7PM-7AM, please contact night-coverage. How to contact the [facility] Attending or Consulting provider [place] or covering provider during after hours [place], for this patient?    Check the care team in [facility] and look for a) attending/consulting [facility] provider listed and b) the [facility] team listed Log into [email or web address] and use [place]'s universal password to access. If you do not have the password, please contact the hospital operator. Locate the [facility] provider you are looking for under [facility] and page to a number that you can be directly reached. If you still have difficulty reaching the provider, please page the [facility] (Director on Call) for the Hospitalists listed on amion for assistance.

## 2020-11-12 NOTE — Care Management Important Message (Signed)
Important Message  Patient Details  Name: Rodney [name] MRN: [id number] Date of Birth: 07-13-1950   Medicare Important Message Given:  Yes     Orbie Pyo 11/12/2020, 3:27 PM

## 2020-11-13 ENCOUNTER — Encounter (HOSPITAL_COMMUNITY)
Admission: EM | Disposition: A | Discharge status: Home Health | Payer: Self-pay | Source: Home / Self Care | Attending: Internal Medicine

## 2020-11-13 DIAGNOSIS — M869 Osteomyelitis, unspecified: Secondary | ICD-10-CM | POA: Diagnosis not present

## 2020-11-13 DIAGNOSIS — I70261 Atherosclerosis of native arteries of extremities with gangrene, right leg: Secondary | ICD-10-CM

## 2020-11-13 HISTORY — PX: PERIPHERAL VASCULAR INTERVENTION: CATH118257

## 2020-11-13 HISTORY — PX: ABDOMINAL AORTOGRAM W/LOWER EXTREMITY: CATH118223

## 2020-11-13 LAB — GLUCOSE, CAPILLARY
Glucose-Capillary: 114 mg/dL — ABNORMAL HIGH (ref 70–99)
Glucose-Capillary: 112 mg/dL — ABNORMAL HIGH (ref 70–99)
Glucose-Capillary: 113 mg/dL — ABNORMAL HIGH (ref 70–99)
Glucose-Capillary: 248 mg/dL — ABNORMAL HIGH (ref 70–99)

## 2020-11-13 LAB — PROTIME-INR
INR: 1.2 (ref 0.8–1.2)
Prothrombin Time: 14.8 seconds (ref 11.4–15.2)

## 2020-11-13 LAB — CBC
HCT: 27.6 % — ABNORMAL LOW (ref 39.0–52.0)
Hemoglobin: 9 g/dL — ABNORMAL LOW (ref 13.0–17.0)
MCH: 29.8 pg (ref 26.0–34.0)
MCHC: 32.6 g/dL (ref 30.0–36.0)
MCV: 91.4 fL (ref 80.0–100.0)
Platelets: 247 10*3/uL (ref 150–400)
RBC: 3.02 MIL/uL — ABNORMAL LOW (ref 4.22–5.81)
RDW: 14.1 % (ref 11.5–15.5)
WBC: 7.7 10*3/uL (ref 4.0–10.5)
nRBC: 0 % (ref 0.0–0.2)

## 2020-11-13 LAB — HEPARIN LEVEL (UNFRACTIONATED): Heparin Unfractionated: 0.43 IU/mL (ref 0.30–0.70)

## 2020-11-13 LAB — BASIC METABOLIC PANEL
Anion gap: 5 (ref 5–15)
BUN: 44 mg/dL — ABNORMAL HIGH (ref 8–23)
CO2: 23 mmol/L (ref 22–32)
Calcium: 8.8 mg/dL — ABNORMAL LOW (ref 8.9–10.3)
Chloride: 107 mmol/L (ref 98–111)
Creatinine, Ser: 1.2 mg/dL (ref 0.61–1.24)
GFR, Estimated: >60 mL/min (ref >60)
Glucose, Bld: 162 mg/dL — ABNORMAL HIGH (ref 70–99)
Potassium: 4.2 mmol/L (ref 3.5–5.1)
Sodium: 135 mmol/L (ref 135–145)

## 2020-11-13 LAB — SURGICAL PCR SCREEN
MRSA, PCR: NEGATIVE
Staphylococcus aureus: NEGATIVE

## 2020-11-13 SURGERY — ABDOMINAL AORTOGRAM W/LOWER EXTREMITY
Anesthesia: LOCAL

## 2020-11-13 MED ORDER — SODIUM CHLORIDE 0.9 % IV SOLN: INTRAVENOUS | Status: DC

## 2020-11-13 MED ORDER — FENTANYL CITRATE (PF) 100 MCG/2ML IJ SOLN
INTRAMUSCULAR | Status: DC | PRN
  Administered 2020-11-13: 50 ug via INTRAVENOUS

## 2020-11-13 MED ORDER — HEPARIN SODIUM (PORCINE) 1000 UNIT/ML IJ SOLN
INTRAMUSCULAR | Status: AC
  Filled 2020-11-13: qty 1

## 2020-11-13 MED ORDER — HEPARIN (PORCINE) 25000 UT/250ML-% IV SOLN
2000.0000 [IU]/h | INTRAVENOUS | Status: DC
  Administered 2020-11-14: 1800 [IU]/h via INTRAVENOUS
  Filled 2020-11-13: qty 250

## 2020-11-13 MED ORDER — NICOTINE 14 MG/24HR TD PT24
14.0000 mg | MEDICATED_PATCH | Freq: Every day | TRANSDERMAL | Status: AC
  Administered 2020-11-13: 14 mg via TRANSDERMAL
  Filled 2020-11-13 (×2): qty 1

## 2020-11-13 MED ORDER — LIDOCAINE HCL (PF) 1 % IJ SOLN
INTRAMUSCULAR | Status: AC
  Filled 2020-11-13: qty 30

## 2020-11-13 MED ORDER — IODIXANOL 320 MG/ML IV SOLN
INTRAVENOUS | Status: DC | PRN
  Administered 2020-11-13: 125 mL via INTRA_ARTERIAL

## 2020-11-13 MED ORDER — HEPARIN (PORCINE) IN NACL 1000-0.9 UT/500ML-% IV SOLN
INTRAVENOUS | Status: AC
  Filled 2020-11-13: qty 500

## 2020-11-13 MED ORDER — MIDAZOLAM HCL 2 MG/2ML IJ SOLN
INTRAMUSCULAR | Status: DC | PRN
  Administered 2020-11-13: 1 mg via INTRAVENOUS

## 2020-11-13 MED ORDER — FENTANYL CITRATE (PF) 100 MCG/2ML IJ SOLN
INTRAMUSCULAR | Status: AC
  Filled 2020-11-13: qty 2

## 2020-11-13 MED ORDER — HEPARIN (PORCINE) IN NACL 1000-0.9 UT/500ML-% IV SOLN
INTRAVENOUS | Status: DC | PRN
  Administered 2020-11-13 (×2): 500 mL

## 2020-11-13 MED ORDER — MIDAZOLAM HCL 2 MG/2ML IJ SOLN
INTRAMUSCULAR | Status: AC
  Filled 2020-11-13: qty 2

## 2020-11-13 MED ORDER — HEPARIN SODIUM (PORCINE) 1000 UNIT/ML IJ SOLN
INTRAMUSCULAR | Status: DC | PRN
  Administered 2020-11-13: 10000 [IU] via INTRAVENOUS

## 2020-11-13 MED ORDER — LIDOCAINE HCL (PF) 1 % IJ SOLN
INTRAMUSCULAR | Status: DC | PRN
  Administered 2020-11-13: 15 mL via INTRADERMAL

## 2020-11-13 SURGICAL SUPPLY — 22 items
CATH NAVICROSS ANGLED 135CM (MICROCATHETER) ×1 IMPLANT
CATH OMNI FLUSH 5F 65CM (CATHETERS) ×1 IMPLANT
CATH QUICKCROSS .035X135CM (MICROCATHETER) ×1 IMPLANT
CLOSURE PERCLOSE PROSTYLE (VASCULAR PRODUCTS) ×1 IMPLANT
DEVICE TORQUE H2O (MISCELLANEOUS) ×1 IMPLANT
GLIDEWIRE ADV .035X260CM (WIRE) ×1 IMPLANT
GUIDEWIRE ANGLED .035X150CM (WIRE) ×1 IMPLANT
GUIDEWIRE ANGLED .035X260CM (WIRE) ×1 IMPLANT
KIT MICROPUNCTURE NIT STIFF (SHEATH) ×1 IMPLANT
KIT PV (KITS) ×3 IMPLANT
SHEATH FLEX ANSEL ST 6FR 45CM (SHEATH) ×1 IMPLANT
SHEATH PINNACLE 5F 10CM (SHEATH) ×1 IMPLANT
SHEATH PINNACLE 6F 10CM (SHEATH) ×1 IMPLANT
SHEATH PINNACLE ST 6F 45CM (SHEATH) ×1 IMPLANT
SHEATH PROBE COVER 6X72 (BAG) ×1 IMPLANT
SYR MEDRAD MARK V 150ML (SYRINGE) ×1 IMPLANT
TRANSDUCER W/STOPCOCK (MISCELLANEOUS) ×3 IMPLANT
TRAY PV CATH (CUSTOM PROCEDURE TRAY) ×3 IMPLANT
WIRE AMPLATZ SS-J .035X260CM (WIRE) ×1 IMPLANT
WIRE BENTSON .035X145CM (WIRE) ×1 IMPLANT
WIRE G V18X300CM (WIRE) ×1 IMPLANT
WIRE ROSEN-J .035X260CM (WIRE) ×1 IMPLANT

## 2020-11-13 NOTE — Progress Notes (Signed)
Patient for vascular procedure today.  He is lying in bed alert awake.  Discussed with him great toe amputation on Friday.  He is wondering if vascular procedure works if it is well perfused the toe.  He understands if he has to have not rotation but if there is an option for salvage.  I told him that vascular procedure would improve the likelihood of the amputation stump healing well.

## 2020-11-13 NOTE — Progress Notes (Signed)
Pt arrived to rm 5 from cath lab. Initiated  tele. CHG wipe performed. VSS. Oriented pt to the unit.   Lavenia Atlas, RN

## 2020-11-13 NOTE — Progress Notes (Signed)
ANTICOAGULATION CONSULT NOTE  Pharmacy Consult:  IV Heparin Indication: History of Afib and DVT  No Known Allergies  Patient Measurements: Height: 6' (182.9 cm) Weight: 103.9 kg (229 lb 0.9 oz) IBW/kg (Calculated) : 77.6 Heparin Dosing Weight: 99 kg  Vital Signs: Temp: 97.7 F (36.5 C) (07/20 1727) Temp Source: Oral (07/20 1727) BP: 117/102 (07/20 1800) Pulse Rate: 79 (07/20 1800)  Labs: Recent Labs    11/11/20 0047 11/12/20 0019 11/13/20 0345  HGB 8.8* 8.5* 9.0*  HCT 27.7* 25.7* 27.6*  PLT 261 257 247  LABPROT 16.1* 15.3* 14.8  INR 1.3* 1.2 1.2  HEPARINUNFRC 0.49 0.47 0.43  CREATININE 1.16  --  1.20    Estimated Creatinine Clearance: 73.4 mL/min (by C-G formula based on SCr of 1.2 mg/dL).  Assessment: 69 yr old man presented with great toe infection with gangrene.  He has a history of a fib and DVT, for which he is on warfarin PTA.  Pharmacy was consulted for IV heparin bridge while warfarin is on hold for procedure. Pt is S/P aortogram and RLE angiogram with third order cannulation this afternoon. Pharmacy is consulted to resume IV heparin 4 hrs after procedure. Pt had therapeutic heparin level on heparin infusion at 1800 units/hr prior to procedure.  H/H 9.0/27.6, plt 247. Per RN, no bleeding issues noted post procedure.  Per surgeon note, unable to recanalize occluded bypass graft today; pt will need R common to anterior tibial artery bypass, trying to schedule procedure for this week.  Goal of Therapy:  Heparin level 0.3-0.7 units/ml Monitor platelets by anticoagulation protocol: Yes   Plan:  Resume heparin infusion at 1800 units/hr at 2100 tonight (4 hrs after procedure) Check heparin level 6 hr after resuming heparin infusion Monitor daily heparin level, CBC  Monitor for bleeding F/U VVS  plans  Gillermina Hu, PharmD, BCPS, [facility] Clinical Pharmacist 11/13/2020, 7:11 PM

## 2020-11-13 NOTE — Progress Notes (Signed)
ANTICOAGULATION CONSULT NOTE  Pharmacy Consult:  Heparin Indication: History of Afib and DVT  No Known Allergies  Patient Measurements: Height: 6' (182.9 cm) Weight: 103.9 kg (229 lb 0.9 oz) IBW/kg (Calculated) : 77.6 Heparin Dosing Weight: 99 kg  Vital Signs: Temp: 98.1 F (36.7 C) (07/20 0802) Temp Source: Oral (07/20 0802) BP: 151/74 (07/20 0802) Pulse Rate: 69 (07/20 0802)  Labs: Recent Labs    11/11/20 0047 11/12/20 0019 11/13/20 0345  HGB 8.8* 8.5* 9.0*  HCT 27.7* 25.7* 27.6*  PLT 261 257 247  LABPROT 16.1* 15.3* 14.8  INR 1.3* 1.2 1.2  HEPARINUNFRC 0.49 0.47 0.43  CREATININE 1.16  --  1.20    Estimated Creatinine Clearance: 73.4 mL/min (by C-G formula based on SCr of 1.2 mg/dL).   Assessment: 35 YOM presented with great toe infection.  He has a history of Afib and DVT on Coumadin PTA.  Pharmacy consulted for IV heparin bridge while Coumadin is on hold for a possible procedure.  Heparin level therapeutic and stable; no bleeding reported.  Goal of Therapy:  Heparin level 0.3-0.7 units/ml Monitor platelets by anticoagulation protocol: Yes   Plan:  Continue heparin gtt at 1800 units/hr Daily heparin level and CBC   Rosario Duey D. Mina Marble, PharmD, BCPS, [place] 11/13/2020, 11:29 AM

## 2020-11-13 NOTE — Op Note (Signed)
DATE OF SERVICE: 11/13/2020  PATIENT:  Rodney [name]  70 y.o. 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  TOURNIQUET:  none  PATIENT DISPOSITION:  PACU - hemodynamically stable.   Delay start of Pharmacological VTE agent (>24hrs) due to surgical blood loss or risk of bleeding: no  INDICATION FOR PROCEDURE: Rodney [name] is a 70 y.o. male right toe gangrene with ultrasound evidence of threatened right superficial femoral to below knee popliteal artery bypass. After careful discussion of risks, benefits, and alternatives the patient was offered angiography. The patient understood and wished to proceed.  OPERATIVE FINDINGS:  Terminal aorta widely patent without stenosis Previously placed right common iliac artery stent widely patent without stenosis Right external iliac, common femoral artery, profunda femoris artery patent without any flow-limiting stenosis. "Waterhammer" flow into proximal aspect of femoropopliteal bypass. Severe disease below the common femoral artery.  Superficial femoral artery reconstitutes with collaterals and small islands throughout its length.  The popliteal artery similarly reconstitutes via collaterals and an island.  The anterior tibial artery reconstitutes at its origin via collaterals and flows to the foot, filling the pedal  arch.  I was unable to recanalize the bypass graft.  DESCRIPTION OF PROCEDURE: After identification of the patient in the pre-operative holding area, the patient was transferred to the operating room. The patient was positioned supine on the operating room table. Anesthesia was induced. The groins was prepped and draped in standard fashion. A surgical pause was performed confirming correct patient, procedure, and operative location.  The left groin was anesthetized with subcutaneous injection of 1% lidocaine. Using ultrasound guidance, the left common femoral artery was accessed with micropuncture technique. Fluoroscopy was used to confirm cannulation over the femoral head. Sheathogram was not performed. The 52F sheath was upsized to 78F.   An 035 glidewire advantage was advanced into the distal aorta. Over the wire an omni flush catheter was advanced to the level of L2. Aortogram was performed - see above for details.   The right common iliac artery was selected with the 035 glidewire advantage. The wire was advanced into the common femoral artery. Over the wire the omni flush catheter was advanced into the external iliac artery. Selective angiography was performed - see above for details.   The decision was made to intervene. The patient was heparinized with 10000 units of heparin. The sheath was exchanged for a 62F x 45 cm sheath. Selective angiography of the left lower extremity was performed prior to intervention.   I was able to select the origin of the bypass graft without much difficulty.  With the support of a quick cross catheter I was able to navigate to the behind knee segment of the bypass graft.  At this point the graft occluded.  I tried to cross this occlusion using multiple wire and catheter combinations.  I ultimately created a perforation.  Was unable  to navigate past the perforation into the bypass graft.  I ended the case here.  A perclose device was used to close the arteriotomy.  Hemostasis was excellent upon completion.  Conscious sedation was administered with the use of IV fentanyl and midazolam under continuous physician and nurse monitoring.  Heart rate, blood pressure, and oxygen saturation were continuously monitored.  Total sedation time was 60 minutes  Upon completion of the case instrument and sharps counts were confirmed correct. The patient was transferred to the PACU in good condition. I was present for all portions of the procedure.  PLAN: Unable to recanalize occluded bypass graft.  Patient will need right common femoral to anterior tibial artery bypass, likely with PTFE.  We will look for time to do this later this week.  Yevonne Aline. [name], MD [facility] Phone Number: [phone] 11/13/2020 4:55 PM

## 2020-11-13 NOTE — Hospital Course (Addendum)
Rodney [name] is a 70 yo male with a history of coronary artery disease, diabetes, CKD 3, hypertension, A. fib on warfarin who presented to the [facility] ED on 7/14 after a family member noted that his right toe wound had maggots in it.  R Great Toe Osteomyelitis  Patient reports about 2 months of a worsening wound on his right great toe that initially improved before becoming acutely worse.  X-rays obtained in the ED were consistent with osteomyelitis. Received cefepime, vancomycin, and flagyl. He remained afebrile and without a white count. Orthopedic surgery was consulted for amputation but felt he would have difficulty healing a great toe amputation given his vasculopathy. Vascular surgery was consulted and recommended transfer to [facility] for a potential revascularization procedure. He transferred to [facility] on 7/16 and was admitted to the hospitalist service. Workup by vascular surgery revealed occluded prior bypass graft.  He was taken to the OR on 7/20 for an attempted redo vascularization that was unsuccessful.  Vascular surgery return to the OR with him on 7/21 to try again and an Endovascular bypass with angioplasty and stent placement in the right SFA with success.  Procedure tolerated well.  On 07/22 he underwent right great toe amputation.  He was discharged home on POD#3 with close follow up Orthopedics in 1 week and VVS in 4 weeks.  Tobacco Use Disorder  ?COPD Rodney [name] endorses a 50-pack-year smoking history and indicates that he has some shortness of breath at baseline.  He received some DuoNeb's in the hospital reported that these are greatly improved his breathing.  Given his high risk for COPD despite never having formal PFTs we started him on a daily umeclidinium inhaler.  Anemia of chronic disease Hb 7.6 on postop day 1.  Transfused 1 unit of blood repeat H&H 8.7. Hb on discharge is ***  AKI  Cr 1.48 on postop day 1.  Treated with mIVF. Cr 1.53 on discharge.

## 2020-11-13 NOTE — Progress Notes (Signed)
PROGRESS NOTE   Rodney [name]  FVC:[id number] DOB: [birth year] DOA: 11/07/2020 PCP: [name], MD  Brief Narrative:  70 year old white male Left BKA Dr. Duda 2021 L DVT 08/14/2020 on Coumadin PVD status post R femoropopliteal bypass 6/38/4665 complicated by cellulitis R LE CAD, A. 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  Angiogram 7/20?  Right great toe amputation 7/22 planned for 7/22  Hospital-Problem based course  Right great toe osteomyelitis maggot infestation -Angiogram pending, appreciate vascular Ortho input--amputation?  7/22-patient has questions regarding whether this is still needed? - Pain is controlled with only Tylenol, continue cefepime Flagyl vancomycin and narrow as per Ortho depending on findings -Continue saline 100 cc/H  PAD - Still smoker-resume nicotine patch 14 mg daily  CAD A. 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 monitor trends carefullyevery 4 today and postprocedure can resume diabetic diet -Continue 70/30 insulin 15 p.m., 30 a.m., continue gabapentin 200 3 times daily (started this admission) - Resume metformin after all procedures inpatient  ATN superimposed on  diabetic nephropathy -Saline as above, hold losartan 12.5 daily until discharge or reimplement as outpatient - Previously on Entresto and considered for the same, Lasix 40 as needed held from admission  HFrEF 09/29/2018 2135%-->HF PEF 50-55% Moderate aortic stenosis-heart cath showed mitral valve annulus issues - Careful with fluids and only was overnight likely discontinue postprocedure angiography -Lasix and ARB held -May need perioperative fluids subsequently prior to 7/22   DVT prophylaxis: Heparin GTT Code Status: Full Family Communication: No family present Disposition:  Status is: Inpatient  Remains inpatient appropriate because:Persistent severe electrolyte disturbances, Unsafe d/c plan, and IV treatments appropriate due to intensity of illness or inability to take PO  Dispo: The patient is from: Home              Anticipated d/c is to:  Unclear at this time              Patient currently is not medically stable to d/c.   Difficult to place patient No       Consultants:  Vascular surgeon Dr. Donzetta Matters Orthopedic surgery Dr. Sharol Given  Procedures:   ABI 7/17 patent bypass-larger containing mixed echoes 4.4 X7.8?  Hematoma  Antimicrobials:   Cefepime Flagyl and vancomycin since admission   Subjective: Awake coherent no distress  n.p.o. for procedure which is at 2 no chest pain no fever Hopeful to save his toe-I told him that surgeons would be the ones to make the final call-he understands that he is going for vascular procedure today   Objective: Vitals:   11/12/20 1955 11/13/20 0022 11/13/20 0502 11/13/20 0802  BP: 131/61 120/61 125/66 (!) 151/74  Pulse: 69 73 65 69  Resp: 19 17 17 20   Temp: 98.3  F (36.8 C) 98.2 F (36.8 C) 98.1 F (36.7 C) 98.1 F (36.7 C)  TempSrc: Oral Oral Oral Oral  SpO2: 95% 97% 98% 96%  Weight:   103.9 kg   Height:        Intake/Output Summary (Last 24 hours) at 11/13/2020 1113 Last data filed at 11/13/2020 0834 Gross per 24 hour   Intake 480 ml  Output 2950 ml  Net -2470 ml   Filed Weights   11/10/20 0448 11/11/20 0600 11/13/20 0502  Weight: 104.6 kg 104.5 kg 103.9 kg    Examination:  EOMI NCAT thick neck moderate dentition S1-S2 no murmur no rub no gallop Abdomen soft obese nontender no rebound Chest clear I looked at the wound but was not able to put a picture in the chart-on the left toe this seems to be on the inner aspect and is 5 by about 3 cm  Data Reviewed: personally reviewed   CBC    Component Value Date/Time   WBC 7.7 11/13/2020 0345   RBC 3.02 (L) 11/13/2020 0345   HGB 9.0 (L) 11/13/2020 0345   HGB 10.7 (L) 08/29/2020 1501   HCT 27.6 (L) 11/13/2020 0345   HCT 33.0 (L) 08/29/2020 1501   PLT 247 11/13/2020 0345   PLT 371 08/29/2020 1501   MCV 91.4 11/13/2020 0345   MCV 89 08/29/2020 1501   MCH 29.8 11/13/2020 0345   MCHC 32.6 11/13/2020 0345   RDW 14.1 11/13/2020 0345   RDW 13.9 08/29/2020 1501   LYMPHSABS 1.6 11/10/2020 0201   MONOABS 0.8 11/10/2020 0201   EOSABS 0.3 11/10/2020 0201   BASOSABS 0.1 11/10/2020 0201   CMP Latest Ref Rng & Units 11/13/2020 11/11/2020 11/10/2020  Glucose 70 - 99 mg/dL 162(H) 139(H) 147(H)  BUN 8 - 23 mg/dL 44(H) 34(H) 30(H)  Creatinine 0.61 - 1.24 mg/dL 1.20 1.16 1.16  Sodium 135 - 145 mmol/L 135 135 135  Potassium 3.5 - 5.1 mmol/L 4.2 4.2 4.2  Chloride 98 - 111 mmol/L 107 108 104  CO2 22 - 32 mmol/L 23 23 24   Calcium 8.9 - 10.3 mg/dL 8.8(L) 8.5(L) 8.6(L)  Total Protein 6.5 - 8.1 g/dL - - -  Total Bilirubin 0.3 - 1.2 mg/dL - - -  Alkaline Phos 38 - 126 U/L - - -  AST 15 - 41 U/L - - -  ALT 0 - 44 U/L - - -     Radiology Studies: No results found.   Scheduled Meds:  amiodarone  200 mg Oral Daily   atorvastatin  80 mg Oral Daily   carvedilol  6.25 mg Oral BID WC   dextromethorphan-guaiFENesin  1 tablet Oral BID   gabapentin  200 mg Oral TID   insulin aspart protamine- aspart  15 Units Subcutaneous Q supper   insulin aspart protamine-  aspart  30 Units Subcutaneous Q breakfast   multivitamin with minerals  1 tablet Oral Daily   nicotine  14 mg Transdermal Daily   nutrition supplement (JUVEN)  1 packet Oral BID BM   Ensure Max Protein  11 oz Oral BID   Continuous Infusions:  sodium chloride 100 mL/hr at 11/13/20 1121   ceFEPime (MAXIPIME) IV 2 g (11/13/20 0617)   heparin 1,800 Units/hr (11/13/20 0547)   metronidazole 500 mg (11/13/20 1123)   vancomycin 1,000 mg (11/12/20 2235)     LOS: 6 days   Time spent: [place], MD [facility] To contact the attending provider between 7A-7P or the covering provider  during after hours 7P-7A, please log into the web site [email or web address] and access using universal [place] password for that web site. If you do not have the password, please call the hospital operator.  11/13/2020, 11:13 AM

## 2020-11-14 ENCOUNTER — Encounter (HOSPITAL_COMMUNITY): Payer: Self-pay | Admitting: Vascular Surgery

## 2020-11-14 ENCOUNTER — Inpatient Hospital Stay (HOSPITAL_COMMUNITY)
Admission: EM | Disposition: A | Discharge status: Home Health | Payer: Self-pay | Source: Home / Self Care | Attending: Internal Medicine

## 2020-11-14 DIAGNOSIS — I70221 Atherosclerosis of native arteries of extremities with rest pain, right leg: Secondary | ICD-10-CM

## 2020-11-14 HISTORY — PX: PERIPHERAL VASCULAR INTERVENTION: CATH118257

## 2020-11-14 LAB — COMPREHENSIVE METABOLIC PANEL
ALT: 11 U/L (ref 0–44)
AST: 12 U/L — ABNORMAL LOW (ref 15–41)
Albumin: 2.4 g/dL — ABNORMAL LOW (ref 3.5–5.0)
Alkaline Phosphatase: 46 U/L (ref 38–126)
Anion gap: 7 (ref 5–15)
BUN: 38 mg/dL — ABNORMAL HIGH (ref 8–23)
CO2: 23 mmol/L (ref 22–32)
Calcium: 8.6 mg/dL — ABNORMAL LOW (ref 8.9–10.3)
Chloride: 105 mmol/L (ref 98–111)
Creatinine, Ser: 1.27 mg/dL — ABNORMAL HIGH (ref 0.61–1.24)
GFR, Estimated: >60 mL/min (ref >60)
Glucose, Bld: 209 mg/dL — ABNORMAL HIGH (ref 70–99)
Potassium: 4.8 mmol/L (ref 3.5–5.1)
Sodium: 135 mmol/L (ref 135–145)
Total Bilirubin: 0.5 mg/dL (ref 0.3–1.2)
Total Protein: 5.7 g/dL — ABNORMAL LOW (ref 6.5–8.1)

## 2020-11-14 LAB — GLUCOSE, CAPILLARY
Glucose-Capillary: 218 mg/dL — ABNORMAL HIGH (ref 70–99)
Glucose-Capillary: 144 mg/dL — ABNORMAL HIGH (ref 70–99)
Glucose-Capillary: 70 mg/dL (ref 70–99)
Glucose-Capillary: 182 mg/dL — ABNORMAL HIGH (ref 70–99)

## 2020-11-14 LAB — CBC
HCT: 26.5 % — ABNORMAL LOW (ref 39.0–52.0)
Hemoglobin: 8.4 g/dL — ABNORMAL LOW (ref 13.0–17.0)
MCH: 30.1 pg (ref 26.0–34.0)
MCHC: 31.7 g/dL (ref 30.0–36.0)
MCV: 95 fL (ref 80.0–100.0)
Platelets: 243 10*3/uL (ref 150–400)
RBC: 2.79 MIL/uL — ABNORMAL LOW (ref 4.22–5.81)
RDW: 14.4 % (ref 11.5–15.5)
WBC: 6.5 10*3/uL (ref 4.0–10.5)
nRBC: 0 % (ref 0.0–0.2)

## 2020-11-14 LAB — HEPARIN LEVEL (UNFRACTIONATED)
Heparin Unfractionated: 0.25 IU/mL — ABNORMAL LOW (ref 0.30–0.70)
Heparin Unfractionated: 0.28 IU/mL — ABNORMAL LOW (ref 0.30–0.70)

## 2020-11-14 LAB — PROTIME-INR
INR: 1.2 (ref 0.8–1.2)
Prothrombin Time: 14.9 seconds (ref 11.4–15.2)

## 2020-11-14 LAB — POCT ACTIVATED CLOTTING TIME: Activated Clotting Time: 208 seconds

## 2020-11-14 SURGERY — PERIPHERAL VASCULAR INTERVENTION
Anesthesia: LOCAL | Laterality: Right

## 2020-11-14 MED ORDER — LABETALOL HCL 5 MG/ML IV SOLN
10.0000 mg | INTRAVENOUS | Status: DC | PRN
Start: 2020-11-14 — End: 2020-11-16

## 2020-11-14 MED ORDER — MIDAZOLAM HCL 2 MG/2ML IJ SOLN
INTRAMUSCULAR | Status: AC
  Filled 2020-11-14: qty 2

## 2020-11-14 MED ORDER — IODIXANOL 320 MG/ML IV SOLN
INTRAVENOUS | Status: DC | PRN
  Administered 2020-11-14: 100 mL

## 2020-11-14 MED ORDER — ACETAMINOPHEN 325 MG PO TABS
650.0000 mg | ORAL_TABLET | ORAL | Status: DC | PRN
  Administered 2020-11-14 – 2020-11-15 (×2): 650 mg via ORAL
  Filled 2020-11-14 (×2): qty 2

## 2020-11-14 MED ORDER — ASPIRIN EC 81 MG PO TBEC: 81.0000 mg | DELAYED_RELEASE_TABLET | Freq: Every day | ORAL | Status: DC

## 2020-11-14 MED ORDER — LIDOCAINE HCL (PF) 1 % IJ SOLN
INTRAMUSCULAR | Status: AC
  Filled 2020-11-14: qty 30

## 2020-11-14 MED ORDER — FENTANYL CITRATE (PF) 100 MCG/2ML IJ SOLN
INTRAMUSCULAR | Status: AC
  Filled 2020-11-14: qty 2

## 2020-11-14 MED ORDER — SODIUM CHLORIDE 0.9 % IV SOLN: 250.0000 mL | INTRAVENOUS | Status: DC | PRN

## 2020-11-14 MED ORDER — GUAIFENESIN ER 600 MG PO TB12: 600.0000 mg | ORAL_TABLET | Freq: Two times a day (BID) | ORAL | Status: DC | PRN

## 2020-11-14 MED ORDER — SODIUM CHLORIDE 0.9% FLUSH
3.0000 mL | Freq: Two times a day (BID) | INTRAVENOUS | Status: DC
  Administered 2020-11-14 – 2020-11-17 (×5): 3 mL via INTRAVENOUS

## 2020-11-14 MED ORDER — FENTANYL CITRATE (PF) 100 MCG/2ML IJ SOLN
INTRAMUSCULAR | Status: DC | PRN
  Administered 2020-11-14: 25 ug via INTRAVENOUS
  Administered 2020-11-14: 50 ug via INTRAVENOUS

## 2020-11-14 MED ORDER — HEPARIN SODIUM (PORCINE) 1000 UNIT/ML IJ SOLN
INTRAMUSCULAR | Status: DC | PRN
  Administered 2020-11-14 (×2): 10000 [IU] via INTRAVENOUS
  Administered 2020-11-14: 3000 [IU] via INTRAVENOUS

## 2020-11-14 MED ORDER — CLOPIDOGREL BISULFATE 300 MG PO TABS
ORAL_TABLET | ORAL | Status: AC
  Filled 2020-11-14: qty 1

## 2020-11-14 MED ORDER — CLOPIDOGREL BISULFATE 75 MG PO TABS: 300.0000 mg | ORAL_TABLET | Freq: Once | ORAL | Status: DC

## 2020-11-14 MED ORDER — MIDAZOLAM HCL 2 MG/2ML IJ SOLN
INTRAMUSCULAR | Status: DC | PRN
  Administered 2020-11-14: 1 mg via INTRAVENOUS
  Administered 2020-11-14: 2 mg via INTRAVENOUS

## 2020-11-14 MED ORDER — CLOPIDOGREL BISULFATE 75 MG PO TABS
75.0000 mg | ORAL_TABLET | Freq: Every day | ORAL | Status: DC
  Administered 2020-11-15 – 2020-11-17 (×3): 75 mg via ORAL
  Filled 2020-11-14 (×3): qty 1

## 2020-11-14 MED ORDER — HEPARIN (PORCINE) IN NACL 1000-0.9 UT/500ML-% IV SOLN
INTRAVENOUS | Status: AC
  Filled 2020-11-14: qty 1000

## 2020-11-14 MED ORDER — INSULIN ASPART 100 UNIT/ML IJ SOLN
0.0000 [IU] | Freq: Three times a day (TID) | INTRAMUSCULAR | Status: DC
Start: 2020-11-14 — End: 2020-11-17
  Administered 2020-11-15: 1 [IU] via SUBCUTANEOUS
  Administered 2020-11-15 – 2020-11-16 (×2): 2 [IU] via SUBCUTANEOUS
  Administered 2020-11-16: 3 [IU] via SUBCUTANEOUS
  Administered 2020-11-16 – 2020-11-17 (×3): 2 [IU] via SUBCUTANEOUS

## 2020-11-14 MED ORDER — SODIUM CHLORIDE 0.9 % IV SOLN: INTRAVENOUS | Status: AC

## 2020-11-14 MED ORDER — UMECLIDINIUM BROMIDE 62.5 MCG/INH IN AEPB
1.0000 | INHALATION_SPRAY | Freq: Every day | RESPIRATORY_TRACT | Status: DC
  Administered 2020-11-15 – 2020-11-17 (×3): 1 via RESPIRATORY_TRACT
  Filled 2020-11-14: qty 7

## 2020-11-14 MED ORDER — SODIUM CHLORIDE 0.9% FLUSH: 3.0000 mL | INTRAVENOUS | Status: DC | PRN

## 2020-11-14 MED ORDER — CLOPIDOGREL BISULFATE 300 MG PO TABS
ORAL_TABLET | ORAL | Status: DC | PRN
  Administered 2020-11-14: 300 mg via ORAL

## 2020-11-14 MED ORDER — HYDRALAZINE HCL 20 MG/ML IJ SOLN: 5.0000 mg | INTRAMUSCULAR | Status: DC | PRN

## 2020-11-14 MED ORDER — ONDANSETRON HCL 4 MG/2ML IJ SOLN: 4.0000 mg | Freq: Four times a day (QID) | INTRAMUSCULAR | Status: DC | PRN

## 2020-11-14 MED ORDER — HEPARIN SODIUM (PORCINE) 1000 UNIT/ML IJ SOLN
INTRAMUSCULAR | Status: AC
  Filled 2020-11-14: qty 1

## 2020-11-14 MED ORDER — LIDOCAINE HCL (PF) 1 % IJ SOLN
INTRAMUSCULAR | Status: DC | PRN
  Administered 2020-11-14: 18 mL

## 2020-11-14 MED ORDER — HEPARIN (PORCINE) 25000 UT/250ML-% IV SOLN
2150.0000 [IU]/h | INTRAVENOUS | Status: DC
  Administered 2020-11-15: 2000 [IU]/h via INTRAVENOUS
  Administered 2020-11-16 – 2020-11-17 (×2): 2150 [IU]/h via INTRAVENOUS
  Filled 2020-11-14 (×5): qty 250

## 2020-11-14 SURGICAL SUPPLY — 27 items
BALLN STERLING OTW 5X220X150 (BALLOONS) ×2
BALLN STERLING OTW 6X220X150 (BALLOONS) ×2
BALLOON STERLING OTW 5X220X150 (BALLOONS) IMPLANT
BALLOON STERLING OTW 6X220X150 (BALLOONS) IMPLANT
BNDG HMST LF TOP THROMBIX (HEMOSTASIS) ×1
CATH CXI 2.6F 90 ANG (CATHETERS) ×2
CATH OMNI FLUSH 5F 65CM (CATHETERS) ×1 IMPLANT
CATH SPRT ANG 90X2.3FR ACPT (CATHETERS) IMPLANT
DEVICE CLOSURE MYNXGRIP 6/7F (Vascular Products) ×1 IMPLANT
DEVICE ONE SNARE 10MM (MISCELLANEOUS) ×1 IMPLANT
GLIDEWIRE ANGLED NITR .018X260 (WIRE) ×1 IMPLANT
KIT ENCORE 26 ADVANTAGE (KITS) ×1 IMPLANT
KIT MICROPUNCTURE NIT STIFF (SHEATH) ×1 IMPLANT
KIT PV (KITS) ×2 IMPLANT
PATCH THROMBIX TOPICAL PLAIN (HEMOSTASIS) ×1 IMPLANT
SHEATH FLEX ANSEL ANG 6F 45CM (SHEATH) ×1 IMPLANT
SHEATH MICROPUNCTURE PEDAL 4FR (SHEATH) ×1 IMPLANT
SHEATH PINNACLE 5F 10CM (SHEATH) ×1 IMPLANT
SHEATH PINNACLE 6F 10CM (SHEATH) ×1 IMPLANT
SHEATH PINNACLE ST 6F 45CM (SHEATH) ×1 IMPLANT
STENT VIABAHN 5X250X120 (Permanent Stent) ×1 IMPLANT
STENT VIABAHN 6X250X120 (Permanent Stent) ×1 IMPLANT
TRANSDUCER W/STOPCOCK (MISCELLANEOUS) ×2 IMPLANT
TRAY PV CATH (CUSTOM PROCEDURE TRAY) ×2 IMPLANT
WIRE BENTSON .035X145CM (WIRE) ×1 IMPLANT
WIRE G V18X300CM (WIRE) ×4 IMPLANT
WIRE ROSEN-J .035X180CM (WIRE) ×1 IMPLANT

## 2020-11-14 NOTE — Progress Notes (Signed)
ANTICOAGULATION CONSULT NOTE  Pharmacy Consult:  IV Heparin Indication: History of Afib and DVT  No Known Allergies  Patient Measurements: Height: 6' (182.9 cm) Weight: 103.9 kg (229 lb 0.9 oz) IBW/kg (Calculated) : 77.6 Heparin Dosing Weight: 99 kg  Vital Signs: Temp: 97.9 F (36.6 C) (07/21 0416) Temp Source: Oral (07/21 0416) BP: 142/73 (07/21 0416) Pulse Rate: 80 (07/21 0416)  Labs: Recent Labs    11/12/20 0019 11/13/20 0345 11/14/20 0327  HGB 8.5* 9.0* 8.4*  HCT 25.7* 27.6* 26.5*  PLT 257 247 243  LABPROT 15.3* 14.8 14.9  INR 1.2 1.2 1.2  HEPARINUNFRC 0.47 0.43 0.25*  CREATININE  --  1.20 1.27*    Estimated Creatinine Clearance: 69.4 mL/min (A) (by C-G formula based on SCr of 1.27 mg/dL (H)).  Assessment: 70 yr old man presented with great toe infection with gangrene.  He has a history of a fib and DVT, for which he is on warfarin PTA.  Pharmacy was consulted for IV heparin bridge while warfarin is on hold for procedure. Pt is S/P aortogram and RLE angiogram with third order cannulation this afternoon. Pharmacy is consulted to resume IV heparin 4 hrs after procedure. Pt had therapeutic heparin level on heparin infusion at 1800 units/hr prior to procedure. Per surgeon note, unable to recanalize occluded bypass graft today; pt will need R common to anterior tibial artery bypass, trying to schedule procedure for this week.  Heparin level slightly subtherapeutic (0.25) on gtt at 1800 units/hr. No issues with line or bleeding reported per RN. Noted pt stable on 1800 units/hr for past few days so will be conservative with increases.  Goal of Therapy:  Heparin level 0.3-0.7 units/ml Monitor platelets by anticoagulation protocol: Yes   Plan:  Increase heparin infusion slightly to 1900 units/hr  F/u heparin level in 6 hours  Sherlon Handing, PharmD, BCPS Please see amion for complete clinical pharmacist phone list 11/14/2020, 4:58 AM

## 2020-11-14 NOTE — Progress Notes (Signed)
Pharmacy Antibiotic Note  Rodney [name] is a 70 y.o. male admitted on 11/07/2020 R great toe gangrene without osteomyelitis, maggots noted in wound.  Pharmacy has been consulted for Vancomycin & Cefepime dosing.   Planning toe amputation 7/22. Scr up slightly. Last estimated AUC on 1000mg  Q24 hr was 479.   Plan: Continue Vanc 1000 mg IV Q24 hr Continue Cefepime 2gm IV q8h Monitor renal fx, end date Order vanc levels if continuing post-amputation   Height: 6' (182.9 cm) Weight: 103.6 kg (228 lb 6.3 oz) IBW/kg (Calculated) : 77.6  Temp (24hrs), Avg:98.1 F (36.7 C), Min:97.7 F (36.5 C), Max:98.6 F (37 C)  Recent Labs  Lab 11/07/20 1436 11/08/20 0442 11/09/20 0525 11/10/20 0201 11/10/20 1800 11/11/20 0047 11/11/20 1443 11/12/20 0019 11/13/20 0345 11/14/20 0327  WBC 9.4   < > 8.6 8.0  --  7.0  --  7.3 7.7 6.5  CREATININE 1.11   < > 1.11 1.16  --  1.16  --   --  1.20 1.27*  LATICACIDVEN 0.8  --   --   --   --   --   --   --   --   --   VANCOTROUGH  --   --   --   --   --   --  16  --   --   --   VANCOPEAK  --   --   --   --  50*  --   --   --   --   --    < > = values in this interval not displayed.     Estimated Creatinine Clearance: 69.3 mL/min (A) (by C-G formula based on SCr of 1.27 mg/dL (H)).    No Known Allergies  Antimicrobials this admission: 7/14 Zosyn x1 7/14 Cefepime >>  7/14 Vancomycin >>  7/15 Flagyl>>  Dose adjustments this admission: 7/19 VP 50/ VT 16 AUC 719>> decr 1500mg  to 1000mg  q24hr  Microbiology results: 7/14 BCx: sent  Thank you for allowing pharmacy to be a part of this patient's care.  Benetta Spar, PharmD, BCPS, BCCP Clinical Pharmacist  Please check AMION for all [place] phone numbers After 10:00 PM, call [place] [phone]

## 2020-11-14 NOTE — Progress Notes (Signed)
Nutrition Follow Up  INTERVENTION:   -Ensure MAX Protein po BID, each supplement provides 150 kcal and 30 grams of protein   -Juven Fruit Punch BID, each serving provides 95kcal and 2.5g of protein (amino acids glutamine and arginine)  -Multivitamin with minerals daily  NUTRITION DIAGNOSIS:   Increased nutrient needs related to wound healing as evidenced by estimated needs.  Ongoing  GOAL:   Patient will meet greater than or equal to 90% of their needs  Progressing   MONITOR:   PO intake, Supplement acceptance, Labs, Weight trends, I & O's, Skin  REASON FOR ASSESSMENT:   Malnutrition Screening Tool    ASSESSMENT:   70 y.o. male with history of peripheral vascular disease status post femoropopliteal bypass in April 2022 following which patient also was admitted the same month for cellulitis of the right lower extremity treated with antibiotics with history of CAD, diabetes mellitus, chronic kidney disease, hypertension and A. fib on Coumadin presents to the ER after patient's family noted that patient's right great toe had a wound from which maggots were going.  7/20- s/p aortogram, RLE extremity angiogram  Plan R great toe removal today. Currently NPO.   Appetite stable per patient. Last six meals documented as 100%, 100%, 100%, 70%, 85%, and 100%. Taking Ensure MAX BID and Juven BID. Continue current interventions.   UOP: 2400 ml x 24 hrs   Drips: NS @ 100 ml/hr  Medications: SS novolog, 70/30 novolog mix Labs: CBG [id number]  Diet Order:   Diet Order             Diet NPO time specified Except for: Sips with Meds  Diet effective now                   EDUCATION NEEDS:   No education needs have been identified at this time  Skin:  Skin Assessment: Skin Integrity Issues: Skin Integrity Issues:: Other (Comment) Other: osteomyelitis of right foot H/o left BKA  Last BM:  7/18  Height:   Ht Readings from Last 1 Encounters:  11/07/20 6' (1.829 m)     Weight:   Wt Readings from Last 1 Encounters:  11/14/20 103.6 kg    BMI:  Body mass index is 30.98 kg/m.  Estimated Nutritional Needs:   Kcal:  2100-2300  Protein:  90-105g  Fluid:  2.1L/day  Mariana Single MS, RD, LDN, CNSC Clinical Nutrition Pager listed in [place]

## 2020-11-14 NOTE — Progress Notes (Addendum)
ANTICOAGULATION CONSULT NOTE  Pharmacy Consult:  IV Heparin Indication: History of Afib and hx DVT    No Known Allergies  Patient Measurements: Height: 6' (182.9 cm) Weight: 103.6 kg (228 lb 6.3 oz) IBW/kg (Calculated) : 77.6 Heparin Dosing Weight: 99 kg  Vital Signs: Temp: 98.6 F (37 C) (07/21 0800) Temp Source: Oral (07/21 0800) BP: 114/90 (07/21 0800) Pulse Rate: 71 (07/21 0800)  Labs: Recent Labs    11/12/20 0019 11/13/20 0345 11/14/20 0327  HGB 8.5* 9.0* 8.4*  HCT 25.7* 27.6* 26.5*  PLT 257 247 243  LABPROT 15.3* 14.8 14.9  INR 1.2 1.2 1.2  HEPARINUNFRC 0.47 0.43 0.25*  CREATININE  --  1.20 1.27*    Estimated Creatinine Clearance: 69.3 mL/min (A) (by C-G formula based on SCr of 1.27 mg/dL (H)).  Assessment: 70 yr old M on warfarin PTA for Afib and hx DVT.  Pharmacy was consulted for IV heparin bridge while warfarin is on hold for toe amputation 7/22.   Heparin infusion was held for endovascular bypass on 7/21. Prior to the procedure, he was slightly subtherapeutic at 0.28 on 1900 units/hr. Plan to restart heparin at 0020 - 8 hours post sheath removal (1614).   Of note, patient was scheduled for a toe amputation 7/22, but procedure was cancelled (heparin to be stopped before per Ortho).  Goal of Therapy:  Heparin level 0.3-0.7 units/ml Monitor platelets by anticoagulation protocol: Yes   Plan:  Restart heparin infusion 2000 units/hr at 0020 6 hour heparin level  Daily HL, CBC/plt Monitor for signs/symptoms of bleeding  F/u restart warfarin  Laurey Arrow, PharmD PGY1 Pharmacy Resident 11/14/2020  4:56 PM  Please check [email or web address] for unit-specific pharmacy phone numbers.

## 2020-11-14 NOTE — Progress Notes (Addendum)
Family Medicine Teaching Service Daily Progress Note Intern Pager: [phone]  Patient name: Rodney [name] Medical record number: [id number] Date of birth: 1951-01-27 Age: 70 y.o. Gender: male  Primary Care Provider: [name], MD Consultants: Ortho, VVS Code Status: Full  Pt Overview and Major Events to Date:  7/14- admitted to [facility] 7/16- transferred to [facility] per vascular 7/20- Vascular angiogram with attempt to recanalize occluded popliteal artery bypass graft, unsuccessful 7/21- Vascular with plans to attempt revascularization 7/22- (Scheduled) R Great toe removal    Assessment and Plan: Mr. Vanscyoc is a 70yo male with a history of CAD, DM2, CKD3, HTN, A fib on Coumadin, who presented to the [facility] ED on 7/14 after a family member noted that his right toe wound had maggots in it. He was transferred to [facility] for vascular recanalization in the hopes that he could have improved blood flow and an increased chance of healing any wound left by an amputation.   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 However, MRI did not show evidence of osteo. He has remained afebrile and without a white count. Blood cx with no growth.  Vascular procedure yesterday was unsuccessful in recanalizing occluded popliteal artery bypass graft.  - Continue broad-spectrum abx due to significant necrosis, can narrow per Ortho depending on surgical findings - Vascular to reattempt revascularization today - OR for amputation tomorrow - Continue Tylenol for pain--foot is insensate - Continue gabapentin 200mg  TID  Tobacco Use ?COPD Cough Patient with 50 pack year smoking history. Currently on nicotine patch.  Says that he has had cough productive of phlegm while in the hospital. Likely that this represents mucousal sloughing Says that he is short of breath at baseline.  Has experienced relief from DuoNeb treatments in the hospital and  has been getting these 3 times daily. -Consider adding umeclidinium -Continue DuoNebs -Continue nicotine patch -We will replace guaifenesin-dextromethorphan with guaifenesin alone  Diabetes Had been receiving BID 70/30 per hospitalist service with CBGs 170s-220s. - Will add SSI for tighter coverage as he will be healing amputation wound  CHF HTN Echo in 04/22 with LVEF 50-55%, Grade II DDF, moderate aortic stenosis. Previous echo in 06/21 with EF 35-40%.   BP has been generally well-controlled off home Losartan - Continue Coreg 6.25mg  BID - Will obtain EKG prior to surgery  - Holding home Losartan in the setting of elevated Cr  A fib on Coumadin  CAD Rate-controlled.  - Continue heparin gtt - Continue amiodarone 200mg  daily - Continue statin  CKD3  Cr. 1.27 up slightly from 1.11 on admission. - Monitor closely   Anemia of chornic disease  Hgb 8.4 after surgery yesterday. Baseline is 9-10.  - Monitor for drop after amputation tomorrow   FEN/GI: NPO for vascular procedure today  PPx: Heparin gtt as above  Dispo:Pending PT recommendations  in 2-3 days. Barriers include surgery tomorrow and subsequent PT eval.   Subjective:  He says that he is "tired of all of this" meaning the many hospital interventions that he has had this hospitalization.  He voices a good understanding of his multicompartment medical conditions and the plan for today and tomorrow regarding his necrotic toe.  He says that he has quite a bit of pain from his procedure yesterday that he feels has been adequately treated.  Objective: Temp:  [97.7 F (36.5 C)-98.2 F (36.8 C)] 97.9 F (36.6 C) (07/21 0416) Pulse Rate:  [69-94] 80 (  07/21 0416) Resp:  [7-29] 20 (07/21 0600) BP: (117-177)/(62-102) 142/73 (07/21 0416) SpO2:  [0 %-100 %] 95 % (07/21 0600) Weight:  [103.6 kg] 103.6 kg (07/21 0600) Physical Exam: General: Older gentleman lying in bed, NAD Cardiovascular: III/VI systolic murmur, regular  rate/rhythm Respiratory: Lungs with bilateral end expiratory wheeze Abdomen: Obese, non-tender, non-distended Extremities: Groin without hematoma. R foot with dressing in place. No erythema or streaking proximal to dressing  Laboratory: Recent Labs  Lab 11/12/20 0019 11/13/20 0345 11/14/20 0327  WBC 7.3 7.7 6.5  HGB 8.5* 9.0* 8.4*  HCT 25.7* 27.6* 26.5*  PLT 257 247 243   Recent Labs  Lab 11/07/20 1436 11/08/20 0442 11/11/20 0047 11/13/20 0345 11/14/20 0327  NA 135   < > 135 135 135  K 4.1   < > 4.2 4.2 4.8  CL 103   < > 108 107 105  CO2 26   < > 23 23 23   BUN 25*   < > 34* 44* 38*  CREATININE 1.11   < > 1.16 1.20 1.27*  CALCIUM 8.9   < > 8.5* 8.8* 8.6*  PROT 7.3  --   --   --  5.7*  BILITOT 0.6  --   --   --  0.5  ALKPHOS 70  --   --   --  46  ALT 13  --   --   --  11  AST 13*  --   --   --  12*  GLUCOSE 109*   < > 139* 162* 209*   < > = values in this interval not displayed.     Imaging/Diagnostic Tests: R Lower extremity angiogram with: OPERATIVE FINDINGS:  Terminal aorta widely patent without stenosis Previously placed right common iliac artery stent widely patent without stenosis Right external iliac, common femoral artery, profunda femoris artery patent without any flow-limiting stenosis. "Waterhammer" flow into proximal aspect of femoropopliteal bypass. Severe disease below the common femoral artery.  Superficial femoral artery reconstitutes with collaterals and small islands throughout its length.  The popliteal artery similarly reconstitutes via collaterals and an island.  The anterior tibial artery reconstitutes at its origin via collaterals and flows to the foot, filling the pedal arch.  Eppie Gibson, MD 11/14/2020, 8:38 AM PGY-1, [place] Intern pager: [phone], text pages welcome

## 2020-11-14 NOTE — Progress Notes (Addendum)
Progress Note    11/14/2020 7:43 AM 1 Day Post-Op  Subjective:  Patient says he was seen by Dr. Donzetta Matters this morning and "is going to surgery this morning   Vitals:   11/14/20 0416 11/14/20 0600  BP: (!) 142/73   Pulse: 80   Resp: 20 20  Temp: 97.9 F (36.6 C)   SpO2: 98% 95%    Physical Exam: General appearance: Awake, alert in no apparent distress Cardiac: Heart rate and rhythm are regular Respirations: Nonlabored  CBC    Component Value Date/Time   WBC 6.5 11/14/2020 0327   RBC 2.79 (L) 11/14/2020 0327   HGB 8.4 (L) 11/14/2020 0327   HGB 10.7 (L) 08/29/2020 1501   HCT 26.5 (L) 11/14/2020 0327   HCT 33.0 (L) 08/29/2020 1501   PLT 243 11/14/2020 0327   PLT 371 08/29/2020 1501   MCV 95.0 11/14/2020 0327   MCV 89 08/29/2020 1501   MCH 30.1 11/14/2020 0327   MCHC 31.7 11/14/2020 0327   RDW 14.4 11/14/2020 0327   RDW 13.9 08/29/2020 1501   LYMPHSABS 1.6 11/10/2020 0201   MONOABS 0.8 11/10/2020 0201   EOSABS 0.3 11/10/2020 0201   BASOSABS 0.1 11/10/2020 0201    BMET    Component Value Date/Time   NA 135 11/14/2020 0327   NA 136 09/05/2020 1442   K 4.8 11/14/2020 0327   CL 105 11/14/2020 0327   CO2 23 11/14/2020 0327   GLUCOSE 209 (H) 11/14/2020 0327   BUN 38 (H) 11/14/2020 0327   BUN 29 (H) 09/05/2020 1442   CREATININE 1.27 (H) 11/14/2020 0327   CREATININE 1.19 10/09/2015 1554   CALCIUM 8.6 (L) 11/14/2020 0327   GFRNONAA >60 11/14/2020 0327   GFRNONAA 65 10/09/2015 1554   GFRAA 52 (L) 05/01/2020 1632   GFRAA 75 10/09/2015 1554     Intake/Output Summary (Last 24 hours) at 11/14/2020 0743 Last data filed at 11/14/2020 0636 Gross per 24 hour  Intake 1547.8 ml  Output 2400 ml  Net -852.2 ml    HOSPITAL MEDICATIONS Scheduled Meds:  amiodarone  200 mg Oral Daily   atorvastatin  80 mg Oral Daily   carvedilol  6.25 mg Oral BID WC   dextromethorphan-guaiFENesin  1 tablet Oral BID   gabapentin  200 mg Oral TID   insulin aspart protamine- aspart  15  Units Subcutaneous Q supper   insulin aspart protamine- aspart  30 Units Subcutaneous Q breakfast   multivitamin with minerals  1 tablet Oral Daily   nicotine  14 mg Transdermal Daily   nutrition supplement (JUVEN)  1 packet Oral BID BM   Ensure Max Protein  11 oz Oral BID   Continuous Infusions:  sodium chloride 100 mL/hr at 11/14/20 0323   ceFEPime (MAXIPIME) IV 2 g (11/14/20 0636)   heparin 1,900 Units/hr (11/14/20 0509)   metronidazole 500 mg (11/14/20 0327)   vancomycin 1,000 mg (11/13/20 2325)   PRN Meds:.acetaminophen **OR** acetaminophen, ipratropium-albuterol  Assessment and Plan: 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   History of LLE DVT>>Coumadin on hold. On heparin infusion  Possible right great toe amp tomorrow by Dr. Sharol Given.  -DVT prophylaxis:  heparin infusion   Risa Grill, PA-C [facility] [phone] 11/14/2020  7:43 AM   I have independently interviewed and examined patient and agree with PA assessment and plan above.  No left groin  hematoma.  Right foot dressing clean dry intact.  I discussed with patient the options which would include right common femoral to anterior tibial artery bypass with graft with subsequent toe amputation.  His other option would be for second attempt at endovascular recanalization of his bypass we are going to attempt that today from a retrograde and antegrade approach.  Patient remains on heparin drip this can continue.  He is tentatively scheduled for amputation with Dr. Sharol Given tomorrow in the [place].  Brandon C. Donzetta Matters, MD [facility] [place] Office: [phone] Pager: [phone]

## 2020-11-14 NOTE — Progress Notes (Signed)
Mobility Specialist: Progress Note   11/14/20 1229  Mobility  Activity  (Bed Exercises)  Level of Assistance Independent  Assistive Device None  Mobility Sit up in bed/chair position for meals  Mobility Response Tolerated well  Mobility performed by Mobility specialist  $Mobility charge 1 Mobility   Pre-Mobility: 70 HR, 96% SpO2 Post-Mobility: 73 HR, 95% SpO2  Pt c/o soreness in BLE during bed level exercises as well as feeling winded when talking and performing exercises. Pt still in the bed with call bell at his side.   [facility] Nico Rogness Mobility Specialist Mobility Specialist Phone: [phone]

## 2020-11-14 NOTE — Plan of Care (Signed)

## 2020-11-14 NOTE — Op Note (Signed)
Patient name: Rodney [name] MRN: [id number] DOB: 26-May-1950 Sex: male  11/14/2020 Pre-operative Diagnosis: Critical limb ischemia of the right lower extremity with tissue loss and occluded right SFA to below-knee popliteal bypass with vein Post-operative diagnosis:  Same Surgeon:  Marty Heck, MD Procedure Performed: 1.  Ultrasound-guided access of the right anterior tibial artery retrograde at the ankle 2.  Ultrasound-guided access left common femoral artery 3.  Endovascular bypass with angioplasty and stent placement in the right SFA to below-knee popliteal bypass (5 mm x 250 mm Viabahn distally and 6 mm x 250 mm Viabahn proximally) 4.  Mynx closure of the left common femoral artery 5.  89 minutes of monitored moderate conscious sedation time  Indications: Patient is a 70 year old male who was seen in consultation with worsening tissue loss in the right great toe with maggots.  Previously had a right common iliac artery stent with a right SFA to below-knee popliteal bypass.  Duplex imaging suggested a very diminished flow through the bypass.  He underwent arteriogram yesterday with Dr. [name] and the bypass appeared occluded and he was unable to cross antegrade.  He presents today for attempted retrograde intervention.  Findings: Initial retrograde tibial access of the anterior tibial at the ankle.  With a V 40 and CXI, I was able to come retrograde from the AT into the native below-knee popliteal artery and the get into the bypass and get all the way up through the proximal anastomosis and into the right common femoral through the occluded bypass.  I then got left common femoral artery access and snared my retrograde wire for through and through access.  The distal popliteal artery and distal occluded bypass was stented with a 5 mm x 250 mm Viabahn and then the proximal occluded bypass was stented with a 6 mm x 250 mm Viabahn.  These were post angioplastied with a 6 mm balloon  proximal and 5 mm balloon distal.  No residual stenosis with widely patent bypass.  Three-vessel runoff.  He does have some disease in the TP trunk.  Brisk right DP signal at completion.   Procedure:  The patient was identified in the holding area and taken to room 8.  The patient was then placed supine on the table and prepped and draped in the usual sterile fashion.  A time out was called.  Ultrasound was used to evaluate the anterior tibial at the ankle.  It was patent and image was saved.  This was accessed with a micro access needle under US guidance and placed a microwire and then a micro sheath retrograde in the anterior tibial artery at the ankle.  I then used a CXI catheter with a V 18 to come retrograde up the anterior tibial into the native below-knee popliteal artery.  The patient was then given 100 units/kg IV heparin.  Initially I was in the native femoral vessel coming retrograde.  Upon multiple images, I found the bypass and was able to cannulate the bypass.  I then was able to come retrograde and get all the way up to the SFA anastomosis and into the common femoral.  I then got left common femoral artery access after I evaluated left common femoral with ultrasound it was patent and image was saved.  I accessed the left common femoral artery with micro access needle and microwire.  I then got 5 French sheath in the left groin and then used an Omni Flush catheter to cross the aortic bifurcation placed  a Rosen wire in the right iliac and then exchanged for a long 6 [place] Ansell sheath in the left groin.  I then snared my wire from pedal access and had through and through access.  I then stented the distal bypass all the way into the native below-knee popliteal at the anastomosis with a 5 mm x 250 mm Viabahn and into the distal bypass and then I placed a second 6 mm x 250 mm Viabahn in the more proximal occluded bypass up to the proximal SFA.  The distal anastomosis of the distal stent was  angioplastied with a 5 mm balloon the proximal stent was angioplastied with a 6 mm balloon.  He then had inline flow down the right lower extremity with widely patent bypass and no residual stenosis.  Pedal sheath was removed after wires were removed.  I exchanged for a short 6 French sheath in the left groin.  Mynx closure device was deployed.   Plan: Patient can proceed with toe amputation.  He now has a patent right leg bypass.  Have ordered dual antiplatelet therapy.  Marty Heck, MD [facility] [place] Office: [phone]

## 2020-11-15 ENCOUNTER — Inpatient Hospital Stay (HOSPITAL_COMMUNITY): Payer: Medicare PPO | Admitting: Anesthesiology

## 2020-11-15 ENCOUNTER — Encounter (HOSPITAL_COMMUNITY)
Admission: EM | Disposition: A | Discharge status: Home Health | Payer: Self-pay | Source: Home / Self Care | Attending: Internal Medicine

## 2020-11-15 ENCOUNTER — Encounter (HOSPITAL_COMMUNITY): Payer: Self-pay | Admitting: Vascular Surgery

## 2020-11-15 ENCOUNTER — Other Ambulatory Visit: Payer: Self-pay | Admitting: Physician Assistant

## 2020-11-15 DIAGNOSIS — M86271 Subacute osteomyelitis, right ankle and foot: Secondary | ICD-10-CM

## 2020-11-15 DIAGNOSIS — M869 Osteomyelitis, unspecified: Secondary | ICD-10-CM | POA: Diagnosis not present

## 2020-11-15 HISTORY — PX: AMPUTATION: SHX166

## 2020-11-15 LAB — BASIC METABOLIC PANEL
Anion gap: 8 (ref 5–15)
BUN: 33 mg/dL — ABNORMAL HIGH (ref 8–23)
CO2: 20 mmol/L — ABNORMAL LOW (ref 22–32)
Calcium: 8.6 mg/dL — ABNORMAL LOW (ref 8.9–10.3)
Chloride: 107 mmol/L (ref 98–111)
Creatinine, Ser: 1.15 mg/dL (ref 0.61–1.24)
GFR, Estimated: >60 mL/min (ref >60)
Glucose, Bld: 142 mg/dL — ABNORMAL HIGH (ref 70–99)
Potassium: 4.2 mmol/L (ref 3.5–5.1)
Sodium: 135 mmol/L (ref 135–145)

## 2020-11-15 LAB — GLUCOSE, CAPILLARY
Glucose-Capillary: 147 mg/dL — ABNORMAL HIGH (ref 70–99)
Glucose-Capillary: 163 mg/dL — ABNORMAL HIGH (ref 70–99)
Glucose-Capillary: 131 mg/dL — ABNORMAL HIGH (ref 70–99)
Glucose-Capillary: 101 mg/dL — ABNORMAL HIGH (ref 70–99)
Glucose-Capillary: 87 mg/dL (ref 70–99)
Glucose-Capillary: 169 mg/dL — ABNORMAL HIGH (ref 70–99)

## 2020-11-15 LAB — CBC
HCT: 26.7 % — ABNORMAL LOW (ref 39.0–52.0)
Hemoglobin: 8.5 g/dL — ABNORMAL LOW (ref 13.0–17.0)
MCH: 30.9 pg (ref 26.0–34.0)
MCHC: 31.8 g/dL (ref 30.0–36.0)
MCV: 97.1 fL (ref 80.0–100.0)
Platelets: 246 10*3/uL (ref 150–400)
RBC: 2.75 MIL/uL — ABNORMAL LOW (ref 4.22–5.81)
RDW: 14.4 % (ref 11.5–15.5)
WBC: 8.9 10*3/uL (ref 4.0–10.5)
nRBC: 0 % (ref 0.0–0.2)

## 2020-11-15 LAB — HEPARIN LEVEL (UNFRACTIONATED)
Heparin Unfractionated: 0.3 IU/mL (ref 0.30–0.70)
Heparin Unfractionated: 0.41 IU/mL (ref 0.30–0.70)

## 2020-11-15 LAB — PROTIME-INR
INR: 1.2 (ref 0.8–1.2)
Prothrombin Time: 15.3 seconds — ABNORMAL HIGH (ref 11.4–15.2)

## 2020-11-15 SURGERY — AMPUTATION DIGIT
Anesthesia: Choice | Laterality: Right

## 2020-11-15 SURGERY — AMPUTATION DIGIT
Anesthesia: Monitor Anesthesia Care | Site: First Toe | Laterality: Right

## 2020-11-15 MED ORDER — FENTANYL CITRATE (PF) 100 MCG/2ML IJ SOLN: 25.0000 ug | INTRAMUSCULAR | Status: DC | PRN

## 2020-11-15 MED ORDER — WARFARIN - PHARMACIST DOSING INPATIENT: Freq: Every day | Status: DC

## 2020-11-15 MED ORDER — PHENYLEPHRINE 40 MCG/ML (10ML) SYRINGE FOR IV PUSH (FOR BLOOD PRESSURE SUPPORT)
PREFILLED_SYRINGE | INTRAVENOUS | Status: AC
  Filled 2020-11-15: qty 10

## 2020-11-15 MED ORDER — NICOTINE 14 MG/24HR TD PT24
14.0000 mg | MEDICATED_PATCH | Freq: Every day | TRANSDERMAL | Status: DC
  Administered 2020-11-15 – 2020-11-17 (×3): 14 mg via TRANSDERMAL
  Filled 2020-11-15 (×3): qty 1

## 2020-11-15 MED ORDER — ONDANSETRON HCL 4 MG/2ML IJ SOLN
INTRAMUSCULAR | Status: AC
  Filled 2020-11-15: qty 2

## 2020-11-15 MED ORDER — LACTATED RINGERS IV SOLN: INTRAVENOUS | Status: DC

## 2020-11-15 MED ORDER — LIDOCAINE HCL 1 % IJ SOLN
INTRAMUSCULAR | Status: DC | PRN
  Administered 2020-11-15: 10 mL

## 2020-11-15 MED ORDER — EPHEDRINE 5 MG/ML INJ
INTRAVENOUS | Status: AC
  Filled 2020-11-15: qty 5

## 2020-11-15 MED ORDER — OXYCODONE HCL 5 MG PO TABS: 5.0000 mg | ORAL_TABLET | Freq: Once | ORAL | Status: DC | PRN

## 2020-11-15 MED ORDER — PROMETHAZINE HCL 25 MG/ML IJ SOLN: 6.2500 mg | INTRAMUSCULAR | Status: DC | PRN

## 2020-11-15 MED ORDER — CEFAZOLIN SODIUM-DEXTROSE 2-4 GM/100ML-% IV SOLN
2.0000 g | INTRAVENOUS | Status: AC
  Administered 2020-11-15: 2 g via INTRAVENOUS
  Filled 2020-11-15: qty 100

## 2020-11-15 MED ORDER — FENTANYL CITRATE (PF) 250 MCG/5ML IJ SOLN
INTRAMUSCULAR | Status: AC
  Filled 2020-11-15: qty 5

## 2020-11-15 MED ORDER — OXYCODONE HCL 5 MG/5ML PO SOLN: 5.0000 mg | Freq: Once | ORAL | Status: DC | PRN

## 2020-11-15 MED ORDER — FUROSEMIDE 40 MG PO TABS
40.0000 mg | ORAL_TABLET | Freq: Once | ORAL | Status: AC
  Administered 2020-11-15: 40 mg via ORAL
  Filled 2020-11-15: qty 1

## 2020-11-15 MED ORDER — PROPOFOL 500 MG/50ML IV EMUL
INTRAVENOUS | Status: DC | PRN
  Administered 2020-11-15: 75 ug/kg/min via INTRAVENOUS

## 2020-11-15 MED ORDER — 0.9 % SODIUM CHLORIDE (POUR BTL) OPTIME
TOPICAL | Status: DC | PRN
  Administered 2020-11-15: 1000 mL

## 2020-11-15 MED ORDER — ORAL CARE MOUTH RINSE: 15.0000 mL | Freq: Once | OROMUCOSAL | Status: AC

## 2020-11-15 MED ORDER — ALBUTEROL SULFATE HFA 108 (90 BASE) MCG/ACT IN AERS
INHALATION_SPRAY | RESPIRATORY_TRACT | Status: DC | PRN
  Administered 2020-11-15: 2 via RESPIRATORY_TRACT

## 2020-11-15 MED ORDER — ONDANSETRON HCL 4 MG/2ML IJ SOLN
INTRAMUSCULAR | Status: DC | PRN
  Administered 2020-11-15: 4 mg via INTRAVENOUS

## 2020-11-15 MED ORDER — WARFARIN SODIUM 5 MG PO TABS
5.0000 mg | ORAL_TABLET | Freq: Once | ORAL | Status: AC
  Administered 2020-11-15: 5 mg via ORAL
  Filled 2020-11-15: qty 1

## 2020-11-15 MED ORDER — MIDAZOLAM HCL 2 MG/2ML IJ SOLN
INTRAMUSCULAR | Status: DC | PRN
Start: 2020-11-15 — End: 2020-11-15
  Administered 2020-11-15: 1 mg via INTRAVENOUS

## 2020-11-15 MED ORDER — MIDAZOLAM HCL 2 MG/2ML IJ SOLN
INTRAMUSCULAR | Status: AC
  Filled 2020-11-15: qty 2

## 2020-11-15 MED ORDER — LIDOCAINE 2% (20 MG/ML) 5 ML SYRINGE
INTRAMUSCULAR | Status: DC | PRN
  Administered 2020-11-15: 100 mg via INTRAVENOUS

## 2020-11-15 MED ORDER — PROPOFOL 10 MG/ML IV BOLUS
INTRAVENOUS | Status: AC
  Filled 2020-11-15: qty 20

## 2020-11-15 MED ORDER — DEXAMETHASONE SODIUM PHOSPHATE 10 MG/ML IJ SOLN
INTRAMUSCULAR | Status: AC
  Filled 2020-11-15: qty 2

## 2020-11-15 MED ORDER — CHLORHEXIDINE GLUCONATE 0.12 % MT SOLN
15.0000 mL | Freq: Once | OROMUCOSAL | Status: AC
  Administered 2020-11-15: 15 mL via OROMUCOSAL
  Filled 2020-11-15: qty 15

## 2020-11-15 MED FILL — Heparin Sod (Porcine)-NaCl IV Soln 1000 Unit/500ML-0.9%: INTRAVENOUS | Qty: 1000 | Status: AC

## 2020-11-15 SURGICAL SUPPLY — 29 items
BAG COUNTER SPONGE SURGICOUNT (BAG) ×2 IMPLANT
BAG SPNG CNTER NS LX DISP (BAG) ×1
BLADE SURG 21 STRL SS (BLADE) ×2 IMPLANT
BNDG CMPR 9X4 STRL LF SNTH (GAUZE/BANDAGES/DRESSINGS) ×1
BNDG COHESIVE 4X5 TAN STRL (GAUZE/BANDAGES/DRESSINGS) ×2 IMPLANT
BNDG ESMARK 4X9 LF (GAUZE/BANDAGES/DRESSINGS) ×1 IMPLANT
BNDG GAUZE ELAST 4 BULKY (GAUZE/BANDAGES/DRESSINGS) ×2 IMPLANT
COVER SURGICAL LIGHT HANDLE (MISCELLANEOUS) ×4 IMPLANT
DRAPE U-SHAPE 47X51 STRL (DRAPES) ×2 IMPLANT
DRSG ADAPTIC 3X8 NADH LF (GAUZE/BANDAGES/DRESSINGS) IMPLANT
DRSG PAD ABDOMINAL 8X10 ST (GAUZE/BANDAGES/DRESSINGS) ×2 IMPLANT
DURAPREP 26ML APPLICATOR (WOUND CARE) ×2 IMPLANT
ELECT REM PT RETURN 9FT ADLT (ELECTROSURGICAL) ×2
ELECTRODE REM PT RTRN 9FT ADLT (ELECTROSURGICAL) ×1 IMPLANT
GAUZE SPONGE 4X4 12PLY STRL (GAUZE/BANDAGES/DRESSINGS) ×1 IMPLANT
GAUZE SPONGE 4X4 12PLY STRL LF (GAUZE/BANDAGES/DRESSINGS) ×1 IMPLANT
GLOVE SURG ORTHO LTX SZ9 (GLOVE) ×2 IMPLANT
GLOVE SURG UNDER POLY LF SZ9 (GLOVE) ×2 IMPLANT
GOWN STRL REUS W/ TWL XL LVL3 (GOWN DISPOSABLE) ×2 IMPLANT
GOWN STRL REUS W/TWL XL LVL3 (GOWN DISPOSABLE) ×4
KIT BASIN OR (CUSTOM PROCEDURE TRAY) ×2 IMPLANT
KIT TURNOVER KIT B (KITS) ×2 IMPLANT
NEEDLE 22X1 1/2 (OR ONLY) (NEEDLE) ×1 IMPLANT
NS IRRIG 1000ML POUR BTL (IV SOLUTION) ×2 IMPLANT
PACK ORTHO EXTREMITY (CUSTOM PROCEDURE TRAY) ×2 IMPLANT
PAD ARMBOARD 7.5X6 YLW CONV (MISCELLANEOUS) ×4 IMPLANT
SUT ETHILON 2 0 PSLX (SUTURE) ×2 IMPLANT
SYR CONTROL 10ML LL (SYRINGE) ×1 IMPLANT
TOWEL GREEN STERILE (TOWEL DISPOSABLE) ×2 IMPLANT

## 2020-11-15 NOTE — Anesthesia Procedure Notes (Signed)
Procedure Name: MAC Date/Time: 11/15/2020 5:05 PM Performed by: Dorthea Cove, CRNA Pre-anesthesia Checklist: Patient identified, Emergency Drugs available, Suction available, Patient being monitored and Timeout performed Patient Re-evaluated:Patient Re-evaluated prior to induction Oxygen Delivery Method: Simple face mask Preoxygenation: Pre-oxygenation with 100% oxygen Induction Type: IV induction Placement Confirmation: positive ETCO2 and CO2 detector Dental Injury: Teeth and Oropharynx as per pre-operative assessment

## 2020-11-15 NOTE — Transfer of Care (Signed)
Immediate Anesthesia Transfer of Care Note  Patient: Rodney [name]  Procedure(s) Performed: RIGHT GREAT TOE AMPUTATION (Right: First Toe)  Patient Location: PACU  Anesthesia Type:MAC  Level of Consciousness: awake, alert  and oriented  Airway & Oxygen Therapy: Patient Spontanous Breathing  Post-op Assessment: Report given to RN and Post -op Vital signs reviewed and stable  Post vital signs: Reviewed and stable  Last Vitals:  Vitals Value Taken Time  BP 104/65 11/15/20 1729  Temp    Pulse 77 11/15/20 1731  Resp 21 11/15/20 1731  SpO2 96 % 11/15/20 1731  Vitals shown include unvalidated device data.  Last Pain:  Vitals:   11/15/20 1518  TempSrc:   PainSc: 0-No pain      Patients Stated Pain Goal: 2 (33/00/76 2263)  Complications: No notable events documented.

## 2020-11-15 NOTE — Progress Notes (Signed)
Family Medicine Teaching Service Daily Progress Note Intern Pager: [phone]  Patient name: Rodney [name] Medical record number: [id number] Date of birth: 11/14/1950 Age: 70 y.o. Gender: male  Primary Care Provider: [name], MD Consultants: VVS, Ortho Code Status: Full  Pt Overview and Major Events to Date:  7/14- admitted to [facility] 7/16- transferred to [facility] per vascular 7/20- Vascular angiogram with attempt to recanalize occluded popliteal artery bypass graft, unsuccessful 7/21- VVS successfully stented bypass graft   Assessment and Plan: Mr. Selley is a 70yo male with a hx of CAD, DM2, CKD 3, HTN, A fib on coumadin, who presented to the [facility] ED on 7/14 after a family member noted his right great toe wound had maggots in it. He was transferred to [facility] for vascular recanalization in the hopes that he could improve blood flow and increase change of healing any wound left by amputation.   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 From their standpoint, he is now clear for Great Toe amputation with ortho. However, it appears that his amputation, originally scheduled for today with Dr. Sharol Given has been cancelled. Per patient report, he will be an add-on case in the OR today - NPO for surgery - Continue vanc, cefepime, flagyl - Tylenol for groin pain related to cath procedure - Gabapentin 200mg  TID  CHF  HTN Have been giving him gentle fluids while NPO for past three days. Has some crackles on lung exam today and complaining of orthopnea. EKG with ?partial RBBB. BP has been well-controlled.  Of note he is wearing nasal cannula for comfort but is able to maintain O2 sats with oxygen flow turned off.  It seems most likely that we have given him a bit of fluid overload - D/c fluids - Spot dose with PO Lasix 40mg  - Continue Coreg 6.25mg  BID - Holding home Losartan  Tobacco Use ?COPD Cough Did  not receive newly prescribed umeclidinium yesterday.  First dose this morning -Continue umeclidinium -Continue as needed DuoNebs -Patient refusing nicotine patch  Diabetes Glucoses 70-182. - Continue current regimen   A fib on Coumadin CAD Rate-controlled sinus rhythm. - Heparin gtt while undergoing procedures - Amiodarone 200mg  daily - Continue home statin   Anemia of Chronic Disease Transfusion threshold 8.0. Hgb this am 8.5 after surgery yesterday. - Monitor, anticipate drop after operation today   FEN/GI: NPO for surgery PPx: Heparin drip Dispo:Pending PT recommendations  in 2-3 days. Barriers include surgery and subsequent PT eval.   Subjective:  Rodney [name] reports that he is "as well as I can be" after his operation yesterday.  He rates his postoperative pain is a 3 out of 10.  He is eager to move forward with amputation today.  States that he spoke with a surgeon this morning who told him he would be an add-on case in the OR today.  Reports that he became orthopneic this morning and asked the nurse to put nasal cannula in place.  Objective: Temp:  [97.9 F (36.6 C)-99.6 F (37.6 C)] 99.6 F (37.6 C) (07/22 0822) Pulse Rate:  [0-84] 84 (07/22 0822) Resp:  [0-65] 20 (07/22 0822) BP: (109-146)/(56-86) 146/72 (07/22 0822) SpO2:  [0 %-100 %] 100 % (07/22 0822) Weight:  [104.8 kg] 104.8 kg (07/22 0500) Physical Exam: General: Tired appearing sitting up in bed, NAD Cardiovascular: Irregular, 3/6 systolic murmur Respiratory: Normal work of breathing on 2 L nasal cannula, lungs with bibasilar crackles,  wheezes noted in the apices Abdomen: Obese, soft, nontender, bowel sounds normal Extremities: Right lower extremity with 1+ distal pulses  Laboratory: Recent Labs  Lab 11/13/20 0345 11/14/20 0327 11/15/20 0615  WBC 7.7 6.5 8.9  HGB 9.0* 8.4* 8.5*  HCT 27.6* 26.5* 26.7*  PLT 247 243 246   Recent Labs  Lab 11/13/20 0345 11/14/20 0327 11/15/20 0615  NA 135 135  135  K 4.2 4.8 4.2  CL 107 105 107  CO2 23 23 20*  BUN 44* 38* 33*  CREATININE 1.20 1.27* 1.15  CALCIUM 8.8* 8.6* 8.6*  PROT  --  5.7*  --   BILITOT  --  0.5  --   ALKPHOS  --  46  --   ALT  --  11  --   AST  --  12*  --   GLUCOSE 162* 209* 142*    Imaging/Diagnostic Tests: Intraoperative arteriogram yesterday with widely patent bypass of right popliteal artery bypass  Eppie Gibson, MD 11/15/2020, 8:25 AM PGY-1, [place] Intern pager: [phone], text pages welcome

## 2020-11-15 NOTE — Op Note (Signed)
11/15/2020  5:35 PM  PATIENT:  Rodney [name]    PRE-OPERATIVE DIAGNOSIS:  Osteomyelitis Right Great Toe  POST-OPERATIVE DIAGNOSIS:  Same  PROCEDURE:  RIGHT GREAT TOE AMPUTATION  SURGEON:  Newt Minion, MD  PHYSICIAN ASSISTANT:None ANESTHESIA:   General  PREOPERATIVE INDICATIONS:  Rodney [name] is a  70 y.o. male with a diagnosis of Osteomyelitis Right Great Toe who failed conservative measures and elected for surgical management.    The risks benefits and alternatives were discussed with the patient preoperatively including but not limited to the risks of infection, bleeding, nerve injury, cardiopulmonary complications, the need for revision surgery, among others, and the patient was willing to proceed.  OPERATIVE IMPLANTS: None  _0 @  OPERATIVE FINDINGS: Good petechial bleeding.  Heparin was not turned off for surgery.  OPERATIVE PROCEDURE: Patient was brought the operating room and underwent a MAC anesthetic.  After adequate levels anesthesia were obtained patient's right lower extremity was prepped using DuraPrep draped into a sterile field a timeout was called.  Patient underwent a digital block with 10 cc of 1% lidocaine plain.  Patient then underwent a great toe amputation through the MTP joint.  There is no abscess there was good petechial bleeding.  Wound was irrigated with normal saline.  Incision was closed using 2-0 nylon a sterile dressing was applied patient was taken the PACU in stable condition.   DISCHARGE PLANNING:  Antibiotic duration: Continue antibiotics for 24 hours  Weightbearing: Touchdown weightbearing on the right  Pain medication: Opioid pathway  Dressing care/ Wound VAC: Dry dressing change as needed  Ambulatory devices: Walker  Discharge to: Discharge planning based on recommendations from physical therapy.  Follow-up: In the office 1 week post operative.

## 2020-11-15 NOTE — Progress Notes (Signed)
Continue Heparin infusion per Dr Sharol Given.

## 2020-11-15 NOTE — Progress Notes (Addendum)
Family Medicine Teaching Service Daily Progress Note Intern Pager: [phone]  Patient name: Rodney [name] Medical record number: [id number] Date of birth: 1950-06-20 Age: 70 y.o. Gender: male  Primary Care Provider: [name], MD Consultants: VVS, ortho  Code Status: FULL   Pt Overview and Major Events to Date:   7/14- admitted to [facility] 7/16- transferred to [facility] per vascular 7/20- Vascular angiogram with attempt to recanalize occluded popliteal artery bypass graft, unsuccessful 7/21- VVS successfully stented bypass graft   Assessment and Plan:  Rodney [name] is a 70yo male with a hx of CAD, DM2, CKD 3, HTN, A fib on coumadin, who is now POD#1 following right great toe amputation.    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  Requesting anything stronger than Tylenol -Ortho following appreciate recs -Continue vanc, cefepime for the next 24s then stop per ortho  -D/c flagyl due to interaction with warfarin  -Tylenol 650mg  Q4H -Gabapentin 200mg  TID -PT OT     AKI Likely secondary to hypovolemia postoperatively Creatinine 1.48, baseline 1.1-1.2 -mIVF normal saline 150cc/hr x 10 hrs -Monitor with BMP -Avoid nephrotoxic agents  A fib on Warfarin  CAD INR today 1.3 Home meds: Warfarin, Lipitor 80mg  daily, amiodarone 200mg  daily  -INR goal 2-3 - Heparin gtt to bridge  -Warfarin per pharmacy  -Continue Amiodarone 200mg  daily -Continue Lipitor 80mg  daily   Anemia of Chronic Disease Hb 7.6, baseline is 9-10.  Likely due to acute blood loss in OR yesterday -Transfusion threshold 8.0 -Transfuse 1 unit of blood today -Follow-up H&H -Monitor with CBC -Due to holding anticoagulation and persistent decline in hemoglobin  CHF  Appears euvolemic on exam today Home meds: Coreg 6.25mg  BID S/p PO Lasix 40mg  yesterday. UOP  1.7L, Is and Os: -561cc -Continue Coreg  6.25mg  BID  HTN BP 104/65 overnight -Holding home Losartan in setting of soft BPs  Tobacco Use Possible COPD Cough Wheeze bilaterally on exam today, sats 100% on 3 L this morning Per RN patient desatted at night.  Reduced oxygen to 2 L. -Continue umeclidinium -Encourage incentive spirometry -Continue as needed DuoNebs -Monitor respiratory status -Wean oxygen as able -Consider PFTs as an outpatient   T2DM  CBGs overnight 169 -Continue SSI  -Continue 70/30 insulin 30 units breakfast, 15 supper -Monitor CBGs before meals and at bedtime  FEN/GI: heart healthy diet PPx: heparin, warfarin  Dispo:Pending PT recommendations  in 2-3 days. Barriers include IV medications and transfusion   Subjective:  Requesting Tylenol for his leg pain.  Objective: Temp:  [97 F (36.1 C)-99.6 F (37.6 C)] 98.5 F (36.9 C) (07/22 1938) Pulse Rate:  [70-84] 72 (07/22 1938) Resp:  [15-22] 21 (07/22 1938) BP: (104-146)/(59-72) 111/60 (07/22 1938) SpO2:  [92 %-100 %] 92 % (07/22 1938) Weight:  [104.8 kg] 104.8 kg (07/22 0500)  Physical Exam: General: Alert, no acute distress Cardio: Normal S1 and S2, RRR, no r/m/g Pulm: Increased work of breathing, wheeze bilaterally Extremities: Left BKA, right lower extremity in Ace wrap, dressings dry no peripheral edema Neuro: Cranial nerves grossly intact   Laboratory: Recent Labs  Lab 11/13/20 0345 11/14/20 0327 11/15/20 0615  WBC 7.7 6.5 8.9  HGB 9.0* 8.4* 8.5*  HCT 27.6* 26.5* 26.7*  PLT 247 243 246   Recent Labs  Lab 11/13/20 0345 11/14/20 0327 11/15/20 0615  NA 135 135 135  K 4.2 4.8 4.2  CL 107 105 107  CO2 23 23 20*  BUN 44* 38* 33*  CREATININE 1.20 1.27* 1.15  CALCIUM 8.8* 8.6* 8.6*  PROT  --  5.7*  --   BILITOT  --  0.5  --   ALKPHOS  --  46  --   ALT  --  11  --   AST  --  12*  --   GLUCOSE 162* 209* 142*     Imaging/Diagnostic Tests: PERIPHERAL VASCULAR CATHETERIZATION  Result Date: 11/14/2020 Patient name: Rodney [name]        MRN: [id number]        DOB: 1951/03/10        Sex: male  11/14/2020 Pre-operative Diagnosis: Critical limb ischemia of the right lower extremity with tissue loss and occluded right SFA to below-knee popliteal bypass with vein Post-operative diagnosis:  Same Surgeon:  Marty Heck, MD Procedure Performed: 1.  Ultrasound-guided access of the right anterior tibial artery retrograde at the ankle 2.  Ultrasound-guided access left common femoral artery 3.  Endovascular bypass with angioplasty and stent placement in the right SFA to below-knee popliteal bypass (5 mm x 250 mm Viabahn distally and 6 mm x 250 mm Viabahn proximally) 4.  Mynx closure of the left common femoral artery 5.  89 minutes of monitored moderate conscious sedation time  Indications: Patient is a 70 year old male who was seen in consultation with worsening tissue loss in the right great toe with maggots.  Previously had a right common iliac artery stent with a right SFA to below-knee popliteal bypass.  Duplex imaging suggested a very diminished flow through the bypass.  He underwent arteriogram yesterday with Dr. [name] and the bypass appeared occluded and he was unable to cross antegrade.  He presents today for attempted retrograde intervention.  Findings: Initial retrograde tibial access of the anterior tibial at the ankle.  With a V 10 and CXI, I was able to come retrograde from the AT into the native below-knee popliteal artery and the get into the bypass and get all the way up through the proximal anastomosis and into the right common femoral through the occluded bypass.  I then got left common femoral artery access and snared my retrograde wire for through and through access.  The distal popliteal artery and distal occluded bypass was stented with a 5 mm x 250 mm Viabahn and then the proximal occluded bypass was stented with a 6 mm x 250 mm Viabahn.  These were post angioplastied with a 6 mm balloon proximal and 5 mm balloon  distal.  No residual stenosis with widely patent bypass.  Three-vessel runoff.  He does have some disease in the TP trunk.  Brisk right DP signal at completion.             Procedure:  The patient was identified in the holding area and taken to room 8.  The patient was then placed supine on the table and prepped and draped in the usual sterile fashion.  A time out was called.  Ultrasound was used to evaluate the anterior tibial at the ankle.  It was patent and image was saved.  This was accessed with a micro access needle under US guidance and placed a microwire and then a micro sheath retrograde in the anterior tibial artery at the ankle.  I then used a CXI catheter with a V 18 to come retrograde up the anterior tibial into the native below-knee popliteal artery.  The patient was then given 100 units/kg IV  heparin.  Initially I was in the native femoral vessel coming retrograde.  Upon multiple images, I found the bypass and was able to cannulate the bypass.  I then was able to come retrograde and get all the way up to the SFA anastomosis and into the common femoral.  I then got left common femoral artery access after I evaluated left common femoral with ultrasound it was patent and image was saved.  I accessed the left common femoral artery with micro access needle and microwire.  I then got 5 French sheath in the left groin and then used an Omni Flush catheter to cross the aortic bifurcation placed a Rosen wire in the right iliac and then exchanged for a long 6 [place] Ansell sheath in the left groin.  I then snared my wire from pedal access and had through and through access.  I then stented the distal bypass all the way into the native below-knee popliteal at the anastomosis with a 5 mm x 250 mm Viabahn and into the distal bypass and then I placed a second 6 mm x 250 mm Viabahn in the more proximal occluded bypass up to the proximal SFA.  The distal anastomosis of the distal stent was angioplastied with a 5 mm  balloon the proximal stent was angioplastied with a 6 mm balloon.  He then had inline flow down the right lower extremity with widely patent bypass and no residual stenosis.  Pedal sheath was removed after wires were removed.  I exchanged for a short 6 French sheath in the left groin.  Mynx closure device was deployed.   Plan: Patient can proceed with toe amputation.  He now has a patent right leg bypass.  Have ordered dual antiplatelet therapy.  Marty Heck, MD [facility] Office: [phone]     Lattie Haw, MD 11/15/2020, 10:17 PM PGY-3, [place] Intern pager: [phone], text pages welcome

## 2020-11-15 NOTE — Progress Notes (Signed)
Pt arrived back to floor from PACU. Dressing to R foot CDI.  VSS.

## 2020-11-15 NOTE — Progress Notes (Signed)
ANTICOAGULATION CONSULT NOTE  Pharmacy Consult:  IV Heparin Indication: History of Afib and hx DVT    No Known Allergies  Patient Measurements: Height: 6' (182.9 cm) Weight: 104.8 kg (231 lb 0.7 oz) IBW/kg (Calculated) : 77.6 Heparin Dosing Weight: 99 kg  Vital Signs: Temp: 98 F (36.7 C) (07/22 0423) Temp Source: Oral (07/22 0423) BP: 118/72 (07/22 0423) Pulse Rate: 80 (07/22 0423)  Labs: Recent Labs    11/13/20 0345 11/14/20 0327 11/14/20 1047 11/15/20 0615  HGB 9.0* 8.4*  --  8.5*  HCT 27.6* 26.5*  --  26.7*  PLT 247 243  --  246  LABPROT 14.8 14.9  --  15.3*  INR 1.2 1.2  --  1.2  HEPARINUNFRC 0.43 0.25* 0.28* 0.30  CREATININE 1.20 1.27*  --  1.15    Estimated Creatinine Clearance: 77 mL/min (by C-G formula based on SCr of 1.15 mg/dL).  Assessment: 70 yr old M on warfarin PTA for Afib and hx DVT.  Pharmacy was consulted for IV heparin bridge while warfarin is on hold for toe amputation 7/22.   Heparin infusion resumed s/p endovascular bypass on 7/21. Heparin level is therapeutic on low end of goal at 0.3 on 2000 units/hr - will increase rate to keep heparin level >0.3. No bleeding noted, Hgb low stable 8s, platelets are normal.  Goal of Therapy:  Heparin level 0.3-0.7 units/ml Monitor platelets by anticoagulation protocol: Yes   Plan:  Increase heparin infusion to 2150 units/hr 6 hour heparin level  Daily heparin level, CBC/plt Monitor for signs/symptoms of bleeding  F/u restart warfarin F/u plan for toe amputation  Thank you for involving pharmacy in this patient's care.  Renold Genta, PharmD, BCPS Clinical Pharmacist Clinical phone for 11/15/2020 until 3p is [phone] 11/15/2020 7:56 AM  **Pharmacist phone directory can be found on [email or web address] listed under [place]**

## 2020-11-15 NOTE — Progress Notes (Signed)
FPTS Brief Progress Note  S:Patient was sleeping comfortably and I didn't wake him up. His MEWS scores was 0.   O: BP 121/61 (BP Location: Left Arm)   Pulse 73   Temp 98.8 F (37.1 C) (Oral)   Resp (!) 22   Ht 6' (1.829 m)   Wt 103.6 kg   SpO2 98%   BMI 30.98 kg/m   General: Laying  supine in bed asleep, Well appearing Respiratory: Non labored breathing.    A/P: Gangrenous Right Great Toe  -Ortho following, appreciate recs -VVS following, appreciate recs -Continue Cefepime 2g q8h -Continue Metranidazole 500mg  q8h -continue Vancomycin 1000mg  daily -Continue Gabapentin 200mg  -Great toe amputation scheduled for 7/22  Alen Bleacher, MD 11/15/2020, 1:37 AM PGY-1, [place] [facility] Night Resident  Please page [phone] with questions.

## 2020-11-15 NOTE — Anesthesia Preprocedure Evaluation (Addendum)
Anesthesia Evaluation  Patient identified by MRN, date of birth, ID band Patient awake    Reviewed: Allergy & Precautions, NPO status , Patient's Chart, lab work & pertinent test results  History of Anesthesia Complications Negative for: history of anesthetic complications  Airway Mallampati: II  TM Distance: >3 FB Neck ROM: Full    Dental  (+) Missing,    Pulmonary Current Smoker and Patient abstained from smoking.,     + wheezing      Cardiovascular hypertension, Pt. on medications + CAD, + Peripheral Vascular Disease and +CHF  Normal cardiovascular exam+ dysrhythmias Atrial Fibrillation      Neuro/Psych negative neurological ROS  negative psych ROS   GI/Hepatic negative GI ROS, Neg liver ROS,   Endo/Other  diabetes, Type 2  Renal/GU Renal InsufficiencyRenal disease  negative genitourinary   Musculoskeletal negative musculoskeletal ROS (+)   Abdominal   Peds  Hematology  (+) anemia , Hgb 8.5   Anesthesia Other Findings Day of surgery medications reviewed with patient.  Reproductive/Obstetrics negative OB ROS                            Anesthesia Physical Anesthesia Plan  ASA: 3  Anesthesia Plan: MAC   Post-op Pain Management:    Induction:   PONV Risk Score and Plan: 2 and Treatment may vary due to age or medical condition, Ondansetron and Propofol infusion  Airway Management Planned: Natural Airway and Simple Face Mask  Additional Equipment: None  Intra-op Plan:   Post-operative Plan:   Informed Consent: I have reviewed the patients History and Physical, chart, labs and discussed the procedure including the risks, benefits and alternatives for the proposed anesthesia with the patient or authorized representative who has indicated his/her understanding and acceptance.       Plan Discussed with: CRNA  Anesthesia Plan Comments:        Anesthesia Quick  Evaluation

## 2020-11-15 NOTE — Progress Notes (Signed)
ANTICOAGULATION CONSULT NOTE  Pharmacy Consult:  IV Heparin Indication: History of Afib and hx DVT    Not on File  Patient Measurements: Height: 6' (182.9 cm) Weight: 104.8 kg (231 lb 0.7 oz) IBW/kg (Calculated) : 77.6 Heparin Dosing Weight: 99 kg  Vital Signs: Temp: 97 F (36.1 C) (07/22 1745) Temp Source: Oral (07/22 1501) BP: 118/59 (07/22 1745) Pulse Rate: 72 (07/22 1745)  Labs: Recent Labs    11/13/20 0345 11/14/20 0327 11/14/20 1047 11/15/20 0615  HGB 9.0* 8.4*  --  8.5*  HCT 27.6* 26.5*  --  26.7*  PLT 247 243  --  246  LABPROT 14.8 14.9  --  15.3*  INR 1.2 1.2  --  1.2  HEPARINUNFRC 0.43 0.25* 0.28* 0.30  CREATININE 1.20 1.27*  --  1.15    Estimated Creatinine Clearance: 77 mL/min (by C-G formula based on SCr of 1.15 mg/dL).  Assessment: 70 yr old M on warfarin PTA for Afib and hx DVT.  Pharmacy was consulted for IV heparin bridge while warfarin is on hold for toe amputation 7/22.   The patient is s/p toe amputation this afternoon. Per Dr. Jess Barters note - heparin was not stopped during the procedure. Heparin level this evening resulted as therapeutic after a rate increase earlier today (HL 0.41 << 0.3, goal of 0.3-0.7).   Contacted FMTS to discuss warfarin restart this evening now that both VVS and ortho procedures have been completed. Okayed warfarin restart this evening - INR 1.2. Will start at 5 mg given recent Flagyl which will increase warfarin sensitivity.  Per discussion with team - will go ahead and stop Flagyl tonight and add 24h stop date/time per ortho recommendations.   Goal of Therapy:  Heparin level 0.3-0.7 units/ml Monitor platelets by anticoagulation protocol: Yes   Plan:  - Continue Heparin at 2150 units/hr (21.5 ml/hr) - Warfarin 5 mg x 1 dose this evening - D/c Flagyl - Add 24h post-op stop date/time to Vanc/Cefepime per ortho recs - Will continue to monitor for any signs/symptoms of bleeding and will follow up with heparin level and  PT/INR in the AM.   Thank you for allowing pharmacy to be a part of this patient's care.  Alycia Rossetti, PharmD, BCPS Clinical Pharmacist Clinical phone for 11/15/2020: [phone] 11/15/2020 6:13 PM   **Pharmacist phone directory can now be found on [email or web address] (PW [email or web address]).  Listed under [place].

## 2020-11-15 NOTE — Progress Notes (Signed)
  Progress Note    11/15/2020 8:41 AM 1 Day Post-Op  Subjective: No overnight issues, right leg feels edematous  Vitals:   11/15/20 0423 11/15/20 0822  BP: 118/72 (!) 146/72  Pulse: 80 84  Resp: 20 20  Temp: 98 F (36.7 C) 99.6 F (37.6 C)  SpO2: 94% 100%    Physical Exam: Awake alert oriented Left groin without hematoma Strong right anterior tibial and posterior tibial signals at the ankle Dressing on the right foot clean dry intact  CBC    Component Value Date/Time   WBC 8.9 11/15/2020 0615   RBC 2.75 (L) 11/15/2020 0615   HGB 8.5 (L) 11/15/2020 0615   HGB 10.7 (L) 08/29/2020 1501   HCT 26.7 (L) 11/15/2020 0615   HCT 33.0 (L) 08/29/2020 1501   PLT 246 11/15/2020 0615   PLT 371 08/29/2020 1501   MCV 97.1 11/15/2020 0615   MCV 89 08/29/2020 1501   MCH 30.9 11/15/2020 0615   MCHC 31.8 11/15/2020 0615   RDW 14.4 11/15/2020 0615   RDW 13.9 08/29/2020 1501   LYMPHSABS 1.6 11/10/2020 0201   MONOABS 0.8 11/10/2020 0201   EOSABS 0.3 11/10/2020 0201   BASOSABS 0.1 11/10/2020 0201    BMET    Component Value Date/Time   NA 135 11/15/2020 0615   NA 136 09/05/2020 1442   K 4.2 11/15/2020 0615   CL 107 11/15/2020 0615   CO2 20 (L) 11/15/2020 0615   GLUCOSE 142 (H) 11/15/2020 0615   BUN 33 (H) 11/15/2020 0615   BUN 29 (H) 09/05/2020 1442   CREATININE 1.15 11/15/2020 0615   CREATININE 1.19 10/09/2015 1554   CALCIUM 8.6 (L) 11/15/2020 0615   GFRNONAA >60 11/15/2020 0615   GFRNONAA 65 10/09/2015 1554   GFRAA 52 (L) 05/01/2020 1632   GFRAA 75 10/09/2015 1554    INR    Component Value Date/Time   INR 1.2 11/15/2020 0615     Intake/Output Summary (Last 24 hours) at 11/15/2020 0841 Last data filed at 11/15/2020 6256 Gross per 24 hour  Intake 1421.43 ml  Output 2080 ml  Net -658.57 ml    Assessment:  70 y.o. male is s/p right lower extremity revascularization with stent grafting.  He will need to be on at least Plavix since he is anticoagulated we could  hold aspirin. 1 Day Post-Op  Plan: [place] for toe amp from vascular standpoint.  We will arrange follow-up in 4 to 6 weeks with right lower extremity arterial duplex and ABIs per   Erlene Quan C. Donzetta Matters, MD [facility] Office: [phone] Pager: [phone]  11/15/2020 8:41 AM

## 2020-11-16 DIAGNOSIS — J90 Pleural effusion, not elsewhere classified: Secondary | ICD-10-CM | POA: Diagnosis present

## 2020-11-16 DIAGNOSIS — Z0000 Encounter for general adult medical examination without abnormal findings: Secondary | ICD-10-CM

## 2020-11-16 DIAGNOSIS — M869 Osteomyelitis, unspecified: Secondary | ICD-10-CM | POA: Diagnosis not present

## 2020-11-16 HISTORY — DX: Pleural effusion, not elsewhere classified: J90

## 2020-11-16 LAB — BASIC METABOLIC PANEL
Anion gap: 6 (ref 5–15)
BUN: 30 mg/dL — ABNORMAL HIGH (ref 8–23)
CO2: 23 mmol/L (ref 22–32)
Calcium: 8.5 mg/dL — ABNORMAL LOW (ref 8.9–10.3)
Chloride: 106 mmol/L (ref 98–111)
Creatinine, Ser: 1.48 mg/dL — ABNORMAL HIGH (ref 0.61–1.24)
GFR, Estimated: 51 mL/min — ABNORMAL LOW (ref >60)
Glucose, Bld: 149 mg/dL — ABNORMAL HIGH (ref 70–99)
Potassium: 4.6 mmol/L (ref 3.5–5.1)
Sodium: 135 mmol/L (ref 135–145)

## 2020-11-16 LAB — CBC
HCT: 24.1 % — ABNORMAL LOW (ref 39.0–52.0)
Hemoglobin: 7.6 g/dL — ABNORMAL LOW (ref 13.0–17.0)
MCH: 29.8 pg (ref 26.0–34.0)
MCHC: 31.5 g/dL (ref 30.0–36.0)
MCV: 94.5 fL (ref 80.0–100.0)
Platelets: 222 10*3/uL (ref 150–400)
RBC: 2.55 MIL/uL — ABNORMAL LOW (ref 4.22–5.81)
RDW: 14.6 % (ref 11.5–15.5)
WBC: 7.9 10*3/uL (ref 4.0–10.5)
nRBC: 0 % (ref 0.0–0.2)

## 2020-11-16 LAB — PROTIME-INR
INR: 1.3 — ABNORMAL HIGH (ref 0.8–1.2)
Prothrombin Time: 16.3 seconds — ABNORMAL HIGH (ref 11.4–15.2)

## 2020-11-16 LAB — GLUCOSE, CAPILLARY
Glucose-Capillary: 184 mg/dL — ABNORMAL HIGH (ref 70–99)
Glucose-Capillary: 177 mg/dL — ABNORMAL HIGH (ref 70–99)
Glucose-Capillary: 216 mg/dL — ABNORMAL HIGH (ref 70–99)
Glucose-Capillary: 151 mg/dL — ABNORMAL HIGH (ref 70–99)

## 2020-11-16 LAB — PREPARE RBC (CROSSMATCH)

## 2020-11-16 LAB — HEMOGLOBIN AND HEMATOCRIT, BLOOD
HCT: 24 % — ABNORMAL LOW (ref 39.0–52.0)
HCT: 24.1 % — ABNORMAL LOW (ref 39.0–52.0)
Hemoglobin: 7.6 g/dL — ABNORMAL LOW (ref 13.0–17.0)
Hemoglobin: 7.7 g/dL — ABNORMAL LOW (ref 13.0–17.0)

## 2020-11-16 LAB — HEPARIN LEVEL (UNFRACTIONATED): Heparin Unfractionated: 0.55 IU/mL (ref 0.30–0.70)

## 2020-11-16 MED ORDER — MAGNESIUM SULFATE 2 GM/50ML IV SOLN: 2.0000 g | Freq: Every day | INTRAVENOUS | Status: DC | PRN

## 2020-11-16 MED ORDER — POLYETHYLENE GLYCOL 3350 17 G PO PACK
17.0000 g | PACK | Freq: Two times a day (BID) | ORAL | Status: DC
  Administered 2020-11-16 – 2020-11-17 (×3): 17 g via ORAL
  Filled 2020-11-16 (×3): qty 1

## 2020-11-16 MED ORDER — BISACODYL 5 MG PO TBEC: 5.0000 mg | DELAYED_RELEASE_TABLET | Freq: Every day | ORAL | Status: DC | PRN

## 2020-11-16 MED ORDER — OXYCODONE HCL 5 MG PO TABS: 10.0000 mg | ORAL_TABLET | ORAL | Status: DC | PRN

## 2020-11-16 MED ORDER — SODIUM CHLORIDE 0.9% IV SOLUTION: Freq: Once | INTRAVENOUS | Status: AC

## 2020-11-16 MED ORDER — DOCUSATE SODIUM 100 MG PO CAPS
100.0000 mg | ORAL_CAPSULE | Freq: Every day | ORAL | Status: DC
  Administered 2020-11-16 – 2020-11-17 (×2): 100 mg via ORAL
  Filled 2020-11-16 (×2): qty 1

## 2020-11-16 MED ORDER — METOPROLOL TARTRATE 5 MG/5ML IV SOLN: 2.0000 mg | INTRAVENOUS | Status: DC | PRN

## 2020-11-16 MED ORDER — ACETAMINOPHEN 325 MG PO TABS
325.0000 mg | ORAL_TABLET | Freq: Four times a day (QID) | ORAL | Status: DC | PRN
  Administered 2020-11-16 – 2020-11-17 (×3): 325 mg via ORAL
  Filled 2020-11-16 (×2): qty 1
  Filled 2020-11-16: qty 2
  Filled 2020-11-16: qty 1

## 2020-11-16 MED ORDER — PHENOL 1.4 % MT LIQD: 1.0000 | OROMUCOSAL | Status: DC | PRN

## 2020-11-16 MED ORDER — GUAIFENESIN-DM 100-10 MG/5ML PO SYRP: 15.0000 mL | ORAL_SOLUTION | ORAL | Status: DC | PRN

## 2020-11-16 MED ORDER — HYDROMORPHONE HCL 1 MG/ML IJ SOLN: 0.5000 mg | INTRAMUSCULAR | Status: DC | PRN

## 2020-11-16 MED ORDER — SENNA 8.6 MG PO TABS
1.0000 | ORAL_TABLET | Freq: Every day | ORAL | Status: DC
  Administered 2020-11-16 – 2020-11-17 (×3): 8.6 mg via ORAL
  Filled 2020-11-16 (×2): qty 1

## 2020-11-16 MED ORDER — SODIUM CHLORIDE 0.9 % IV SOLN: INTRAVENOUS | Status: DC

## 2020-11-16 MED ORDER — SODIUM CHLORIDE 0.9 % IV SOLN: INTRAVENOUS | Status: AC

## 2020-11-16 MED ORDER — OXYCODONE HCL 5 MG PO TABS
5.0000 mg | ORAL_TABLET | ORAL | Status: DC | PRN
  Administered 2020-11-16 – 2020-11-17 (×3): 5 mg via ORAL
  Filled 2020-11-16 (×2): qty 1
  Filled 2020-11-16: qty 2
  Filled 2020-11-16: qty 1

## 2020-11-16 MED ORDER — ALUM & MAG HYDROXIDE-SIMETH 200-200-20 MG/5ML PO SUSP: 15.0000 mL | ORAL | Status: DC | PRN

## 2020-11-16 MED ORDER — HYDRALAZINE HCL 20 MG/ML IJ SOLN: 5.0000 mg | INTRAMUSCULAR | Status: DC | PRN

## 2020-11-16 MED ORDER — WARFARIN SODIUM 5 MG PO TABS
5.0000 mg | ORAL_TABLET | Freq: Once | ORAL | Status: AC
  Administered 2020-11-16: 5 mg via ORAL
  Filled 2020-11-16: qty 1

## 2020-11-16 MED ORDER — POTASSIUM CHLORIDE CRYS ER 20 MEQ PO TBCR: 20.0000 meq | EXTENDED_RELEASE_TABLET | Freq: Every day | ORAL | Status: DC | PRN

## 2020-11-16 MED ORDER — CEFAZOLIN SODIUM-DEXTROSE 2-4 GM/100ML-% IV SOLN: 2.0000 g | Freq: Three times a day (TID) | INTRAVENOUS | Status: DC

## 2020-11-16 MED ORDER — PANTOPRAZOLE SODIUM 40 MG PO TBEC
40.0000 mg | DELAYED_RELEASE_TABLET | Freq: Every day | ORAL | Status: DC
  Administered 2020-11-16 – 2020-11-17 (×2): 40 mg via ORAL
  Filled 2020-11-16 (×2): qty 1

## 2020-11-16 MED ORDER — JUVEN PO PACK
1.0000 | PACK | Freq: Two times a day (BID) | ORAL | Status: DC
  Administered 2020-11-16 – 2020-11-17 (×3): 1 via ORAL
  Filled 2020-11-16 (×2): qty 1

## 2020-11-16 MED ORDER — MAGNESIUM CITRATE PO SOLN: 1.0000 | Freq: Once | ORAL | Status: DC | PRN

## 2020-11-16 MED ORDER — LABETALOL HCL 5 MG/ML IV SOLN: 10.0000 mg | INTRAVENOUS | Status: DC | PRN

## 2020-11-16 MED ORDER — ONDANSETRON HCL 4 MG/2ML IJ SOLN: 4.0000 mg | Freq: Four times a day (QID) | INTRAMUSCULAR | Status: DC | PRN

## 2020-11-16 MED ORDER — ZINC SULFATE 220 (50 ZN) MG PO CAPS
220.0000 mg | ORAL_CAPSULE | Freq: Every day | ORAL | Status: DC
  Administered 2020-11-16 – 2020-11-17 (×2): 220 mg via ORAL
  Filled 2020-11-16 (×2): qty 1

## 2020-11-16 MED ORDER — POLYETHYLENE GLYCOL 3350 17 G PO PACK: 17.0000 g | PACK | Freq: Every day | ORAL | Status: DC | PRN

## 2020-11-16 MED ORDER — ASCORBIC ACID 500 MG PO TABS
1000.0000 mg | ORAL_TABLET | Freq: Every day | ORAL | Status: DC
  Administered 2020-11-16 – 2020-11-17 (×2): 1000 mg via ORAL
  Filled 2020-11-16 (×2): qty 2

## 2020-11-16 NOTE — Progress Notes (Signed)
Inpatient Rehab Admissions Coordinator:  Consult received. However, pt refused to work with PT.  Also, d/t payor trends pt's insurance is likely to deny insurance authorization to CIR.  Will continue to follow pt's tolerance and progress with therapies.    Gayland Curry, [place], [facility] Admissions Coordinator [phone]

## 2020-11-16 NOTE — Significant Event (Signed)
A.m. hemoglobin on postop day #1 was 7.6.  Received 1 unit PRBC, post H&H 7 6.  Had lab repeat, repeat hemoglobin for confirmation 7.7.  Discussed with patient, patient disappointed as he would like to go home.  Patient consented to additional 1 unit PRBC to be administered overnight.  Would like to leave "the crack of dawn", requests discharge 6-6:30 AM.  Will notify night team.   Ezequiel Essex, MD PGY-2, [place] Medicine Service pager [phone]

## 2020-11-16 NOTE — Progress Notes (Signed)
Strong doppler flow in R foot. Good bleeding encountered at amputation yesterday. Continue best medical therapy for PAD: ASA / Plavix / Statin. Safe for discharge from my perspective. Follow up with VVS PA in 4 weeks with ABI / RLE duplex.   Rodney [name]. [name], MD [facility] Phone Number: [phone] 11/16/2020 10:35 AM

## 2020-11-16 NOTE — Progress Notes (Signed)
PT Cancellation Note  Patient Details Name: Rodney [name] MRN: [id number] DOB: March 13, 1951   Cancelled Treatment:    Reason Eval/Treat Not Completed: Patient declined, no reason specified Pt refusing to work with PT and states "I do not have to prove that I can move to y'all before I go". Will reattempt should pt decide he would like to work with PT as I feel he would benefit from therapy services given new weightbearing restrictions and hx of L BKA.   Lou Miner, DPT  Acute Rehabilitation Services  Pager: [phone] Office: [phone]    Rudean Hitt 11/16/2020, 1:34 PM

## 2020-11-16 NOTE — Progress Notes (Signed)
Patient ID: Rodney [name], male   DOB: 1951/02/27, 70 y.o.   MRN: [id number] Patient is postoperative day 1 right great toe amputation of the MTP joint.  The dressing is dry patient has no complaints.  Patient may discharge from orthopedic standpoint once stable from a vascular standpoint.  Heparin was not held during the toe amputation.  Patient may be touchdown weightbearing on the right heel with his postoperative shoe.  I will follow-up in 1 week.

## 2020-11-16 NOTE — Anesthesia Postprocedure Evaluation (Signed)
Anesthesia Post Note  Patient: Rodney [name]  Procedure(s) Performed: RIGHT GREAT TOE AMPUTATION (Right: First Toe)     Patient location during evaluation: PACU Anesthesia Type: MAC Level of consciousness: awake and alert and oriented Pain management: pain level controlled Vital Signs Assessment: post-procedure vital signs reviewed and stable Respiratory status: spontaneous breathing, nonlabored ventilation and respiratory function stable Cardiovascular status: blood pressure returned to baseline Postop Assessment: no apparent nausea or vomiting Anesthetic complications: no   No notable events documented.       Brennan Bailey

## 2020-11-16 NOTE — Progress Notes (Signed)
ANTICOAGULATION CONSULT NOTE  Pharmacy Consult:  IV Heparin Indication: History of Afib and hx DVT    Not on File  Patient Measurements: Height: 6' (182.9 cm) Weight: 108.4 kg (238 lb 15.7 oz) IBW/kg (Calculated) : 77.6 Heparin Dosing Weight: 99 kg  Vital Signs: Temp: 98.3 F (36.8 C) (07/23 0807) Temp Source: Oral (07/23 0807) BP: 123/63 (07/23 0807) Pulse Rate: 78 (07/23 0807)  Labs: Recent Labs    11/14/20 0327 11/14/20 1047 11/15/20 0615 11/15/20 1835 11/16/20 0440  HGB 8.4*  --  8.5*  --  7.6*  HCT 26.5*  --  26.7*  --  24.1*  PLT 243  --  246  --  222  LABPROT 14.9  --  15.3*  --  16.3*  INR 1.2  --  1.2  --  1.3*  HEPARINUNFRC 0.25*   < > 0.30 0.41 0.55  CREATININE 1.27*  --  1.15  --  1.48*   < > = values in this interval not displayed.    Estimated Creatinine Clearance: 60.7 mL/min (A) (by C-G formula based on SCr of 1.48 mg/dL (H)).  Assessment: 70 yr old M on warfarin PTA for Afib and hx DVT.  Pharmacy was consulted for IV heparin bridge while warfarin is on hold for toe amputation 7/22.   The patient is s/p toe amputation yesterday, contributing to the drop in hemoglobin and platelets. Heparin level this morning remained therapeutic.  INR this morning remains subtherapeutic at 1.3 as suspected after resuming the patients home dose. Will continue at current dose and continue to monitor. Patient is no longer taking Flagyl, but remains on amiodarone, a PTA medication.  Goal of Therapy:  Heparin level 0.3-0.7 units/ml Monitor platelets by anticoagulation protocol: Yes INR 2-3   Plan:  - Continue Heparin at 2150 units/hr (21.5 ml/hr) - Warfarin 5 mg x 1 dose this evening - Will continue to monitor for any signs/symptoms of bleeding and will follow up with heparin level and PT/INR in the AM.   Thank you for allowing pharmacy to participate in this patient's care.  Reatha Harps, PharmD PGY1 Pharmacy Resident 11/16/2020 9:39 AM Check [email or web address] for unit  specific pharmacy number

## 2020-11-17 ENCOUNTER — Encounter (HOSPITAL_COMMUNITY): Payer: Self-pay | Admitting: Orthopedic Surgery

## 2020-11-17 ENCOUNTER — Inpatient Hospital Stay (HOSPITAL_COMMUNITY): Payer: Medicare PPO

## 2020-11-17 DIAGNOSIS — M869 Osteomyelitis, unspecified: Secondary | ICD-10-CM | POA: Diagnosis not present

## 2020-11-17 LAB — BRAIN NATRIURETIC PEPTIDE: B Natriuretic Peptide: 198.3 pg/mL — ABNORMAL HIGH (ref 0.0–100.0)

## 2020-11-17 LAB — CBC
HCT: 27.2 % — ABNORMAL LOW (ref 39.0–52.0)
Hemoglobin: 8.7 g/dL — ABNORMAL LOW (ref 13.0–17.0)
MCH: 30 pg (ref 26.0–34.0)
MCHC: 32 g/dL (ref 30.0–36.0)
MCV: 93.8 fL (ref 80.0–100.0)
Platelets: 200 10*3/uL (ref 150–400)
RBC: 2.9 MIL/uL — ABNORMAL LOW (ref 4.22–5.81)
RDW: 14.8 % (ref 11.5–15.5)
WBC: 7.9 10*3/uL (ref 4.0–10.5)
nRBC: 0 % (ref 0.0–0.2)

## 2020-11-17 LAB — BPAM RBC
Blood Product Expiration Date: 202208172359
Blood Product Expiration Date: 202208222359
ISSUE DATE / TIME: 202207231012
ISSUE DATE / TIME: 202207231959
Unit Type and Rh: 6200
Unit Type and Rh: 6200

## 2020-11-17 LAB — BASIC METABOLIC PANEL
Anion gap: 5 (ref 5–15)
BUN: 35 mg/dL — ABNORMAL HIGH (ref 8–23)
CO2: 23 mmol/L (ref 22–32)
Calcium: 8.2 mg/dL — ABNORMAL LOW (ref 8.9–10.3)
Chloride: 107 mmol/L (ref 98–111)
Creatinine, Ser: 1.53 mg/dL — ABNORMAL HIGH (ref 0.61–1.24)
GFR, Estimated: 49 mL/min — ABNORMAL LOW (ref >60)
Glucose, Bld: 129 mg/dL — ABNORMAL HIGH (ref 70–99)
Potassium: 5 mmol/L (ref 3.5–5.1)
Sodium: 135 mmol/L (ref 135–145)

## 2020-11-17 LAB — HEPARIN LEVEL (UNFRACTIONATED): Heparin Unfractionated: 0.59 IU/mL (ref 0.30–0.70)

## 2020-11-17 LAB — TYPE AND SCREEN
ABO/RH(D): A POS
Antibody Screen: NEGATIVE
Unit division: 0
Unit division: 0

## 2020-11-17 LAB — PROTIME-INR
INR: 1.3 — ABNORMAL HIGH (ref 0.8–1.2)
Prothrombin Time: 16.1 seconds — ABNORMAL HIGH (ref 11.4–15.2)

## 2020-11-17 LAB — GLUCOSE, CAPILLARY
Glucose-Capillary: 163 mg/dL — ABNORMAL HIGH (ref 70–99)
Glucose-Capillary: 178 mg/dL — ABNORMAL HIGH (ref 70–99)

## 2020-11-17 LAB — HEMOGLOBIN AND HEMATOCRIT, BLOOD
HCT: 30 % — ABNORMAL LOW (ref 39.0–52.0)
Hemoglobin: 9.6 g/dL — ABNORMAL LOW (ref 13.0–17.0)

## 2020-11-17 IMAGING — DX DG CHEST 1V PORT
1 series · 1 of 1 positions shown · non-contrast
Comparison: [DATE] chest radiograph.

CLINICAL DATA: Dyspnea, admitted for osteomyelitis

EXAM:
PORTABLE CHEST 1 VIEW

[chest]
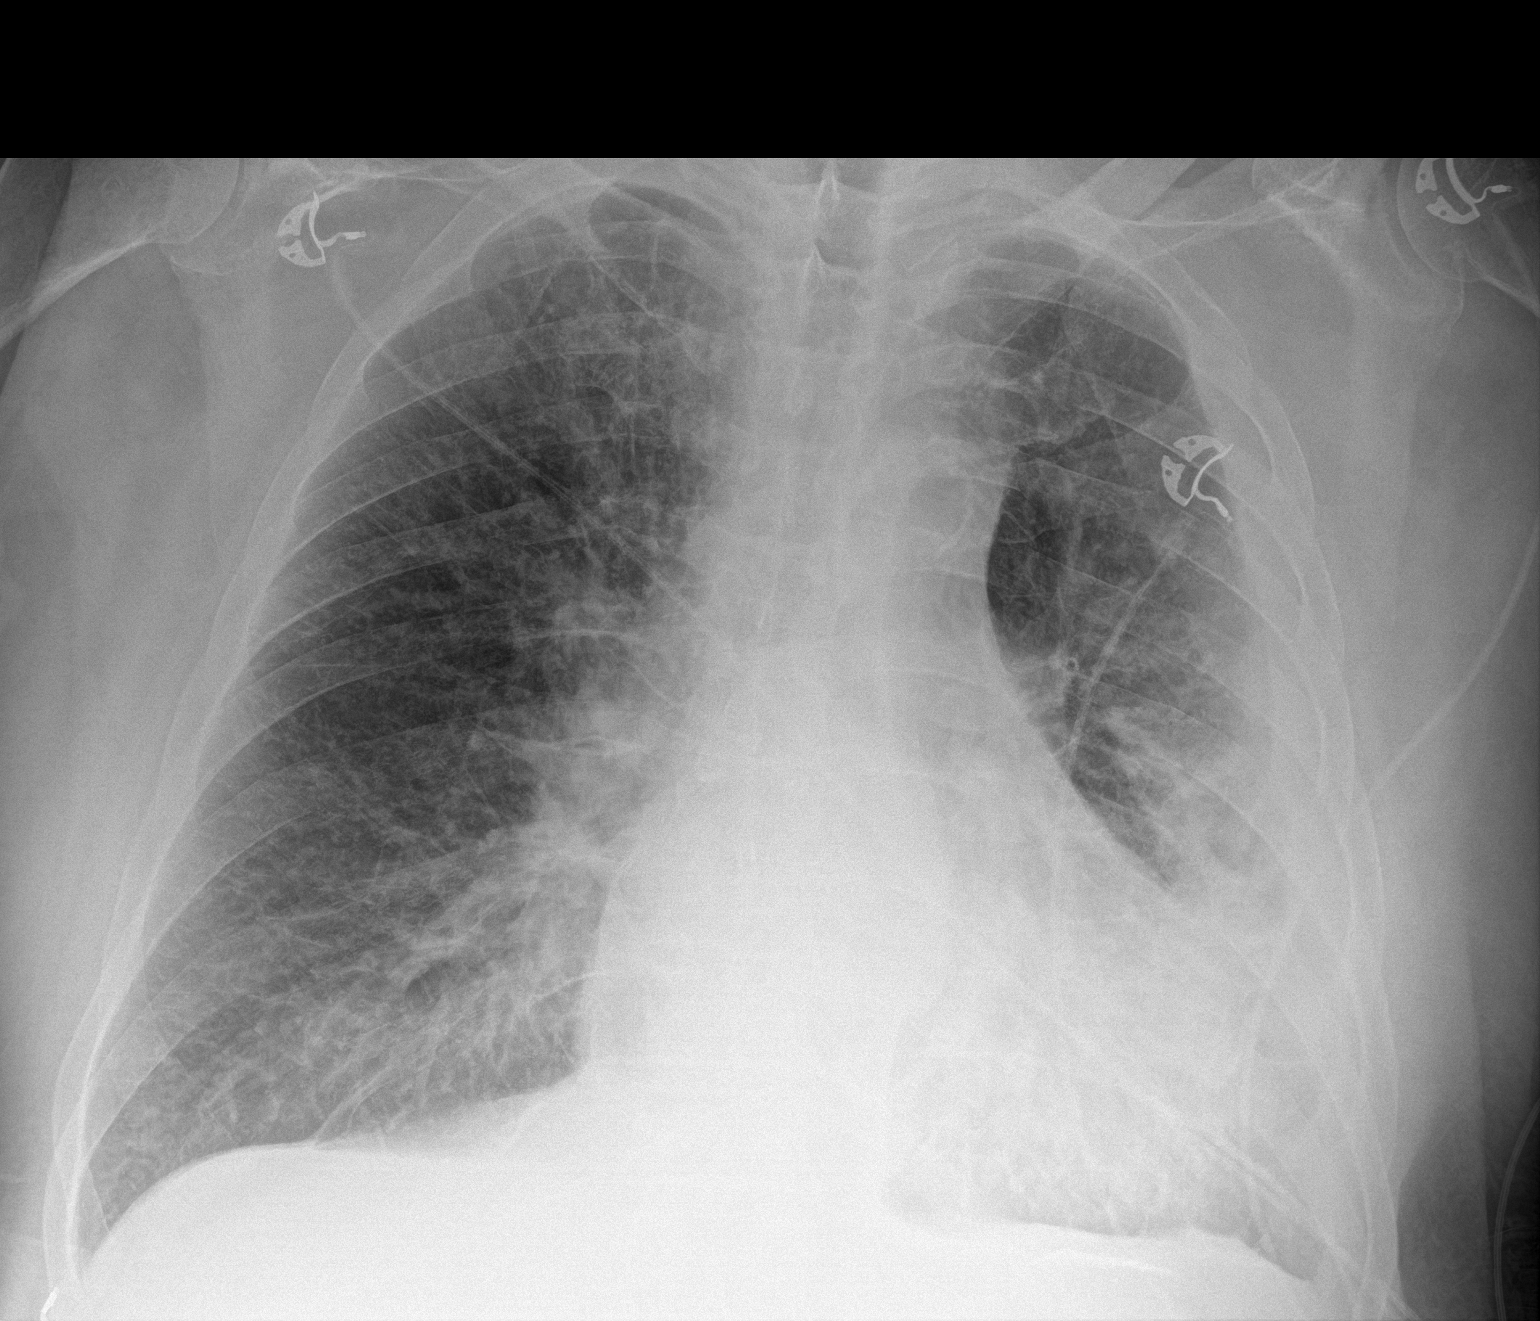

[1 of 1 positions shown; findings below may reference images not displayed]

FINDINGS: Stable cardiomediastinal silhouette with mild cardiomegaly. No
pneumothorax. Asymmetric smooth left pleural thickening throughout
the peripheral left pleural space, mildly increased from prior, with
adjacent curvilinear peripheral left lung opacities and mild volume
loss in the left hemithorax. No right pleural effusion. Mild diffuse
prominence of the parahilar interstitial markings.
IMPRESSION: Mild cardiomegaly with mild diffuse prominence of the parahilar
interstitial markings, suggesting mild cardiogenic pulmonary edema.

Asymmetric mild volume loss in the left hemithorax. Asymmetric
smooth peripheral left pleural thickening with adjacent curvilinear
lung opacities, mildly increased from prior, favoring chronic
pleural-parenchymal scarring, with loculated small left pleural
effusion not excluded.

## 2020-11-17 MED ORDER — WARFARIN SODIUM 7.5 MG PO TABS: 7.5000 mg | ORAL_TABLET | Freq: Once | ORAL | Status: DC

## 2020-11-17 MED ORDER — ENOXAPARIN SODIUM 100 MG/ML IJ SOSY: 100.0000 mg | PREFILLED_SYRINGE | Freq: Two times a day (BID) | INTRAMUSCULAR | 0 refills | Status: DC

## 2020-11-17 MED ORDER — ZINC SULFATE 220 (50 ZN) MG PO CAPS: 220.0000 mg | ORAL_CAPSULE | Freq: Every day | ORAL | 0 refills | Status: DC

## 2020-11-17 MED ORDER — CLOPIDOGREL BISULFATE 75 MG PO TABS: 75.0000 mg | ORAL_TABLET | Freq: Every day | ORAL | 0 refills | Status: DC

## 2020-11-17 MED ORDER — UMECLIDINIUM BROMIDE 62.5 MCG/INH IN AEPB: 1.0000 | INHALATION_SPRAY | Freq: Every day | RESPIRATORY_TRACT | 0 refills | Status: DC

## 2020-11-17 MED ORDER — ALBUTEROL SULFATE (2.5 MG/3ML) 0.083% IN NEBU
3.0000 mL | INHALATION_SOLUTION | RESPIRATORY_TRACT | Status: DC | PRN
  Administered 2020-11-17: 3 mL via RESPIRATORY_TRACT
  Filled 2020-11-17 (×2): qty 3

## 2020-11-17 MED ORDER — ACETAMINOPHEN 325 MG PO TABS: 325.0000 mg | ORAL_TABLET | Freq: Four times a day (QID) | ORAL | 0 refills | Status: DC | PRN

## 2020-11-17 MED ORDER — NICOTINE 14 MG/24HR TD PT24: 14.0000 mg | MEDICATED_PATCH | Freq: Every day | TRANSDERMAL | 0 refills | Status: DC

## 2020-11-17 MED ORDER — GABAPENTIN 100 MG PO CAPS: 200.0000 mg | ORAL_CAPSULE | Freq: Three times a day (TID) | ORAL | 0 refills | Status: DC

## 2020-11-17 MED ORDER — ADULT MULTIVITAMIN W/MINERALS CH: 1.0000 | ORAL_TABLET | Freq: Every day | ORAL | 0 refills | Status: DC

## 2020-11-17 MED ORDER — ENOXAPARIN SODIUM 100 MG/ML IJ SOSY
100.0000 mg | PREFILLED_SYRINGE | Freq: Once | INTRAMUSCULAR | Status: DC
  Filled 2020-11-17: qty 1

## 2020-11-17 MED ORDER — OXYCODONE HCL 5 MG PO TABS: 5.0000 mg | ORAL_TABLET | Freq: Three times a day (TID) | ORAL | 0 refills | Status: DC | PRN

## 2020-11-17 MED ORDER — WARFARIN SODIUM 7.5 MG PO TABS: 7.5000 mg | ORAL_TABLET | Freq: Once | ORAL | 0 refills | Status: DC

## 2020-11-17 MED ORDER — JUVEN PO PACK: 1.0000 | PACK | Freq: Two times a day (BID) | ORAL | 0 refills | Status: DC

## 2020-11-17 MED ORDER — SENNA 8.6 MG PO TABS: 1.0000 | ORAL_TABLET | Freq: Every day | ORAL | 0 refills | Status: DC

## 2020-11-17 NOTE — Progress Notes (Addendum)
OT Screen Note  Patient Details Name: Rodney [name] MRN: [id number] DOB: [birth year]   Cancelled Treatment:    Reason Eval/Treat Not Completed: OT screened, no needs identified, will sign off. Pt reporting he feels comfortable with all BADLs including dressing, toilet transfer to 3N1, and tub transfer with bench. Declining OT at this time. Pt reporting "I have all the PT I could ever want" and reports a HHPT plans on coming at dc to resume therapy (once RLE heals). Pt reports his girlfriend is present at home and very supportive. All acute OT needs met.   [place], OTR/L Acute Rehab Pager: [phone] Office: [phone] 11/17/2020, 8:57 AM

## 2020-11-17 NOTE — TOC Initial Note (Signed)
Transition of Care [facility]) - Initial/Assessment Note    Patient Details  Name: Rodney [name] MRN: [id number] Date of Birth: Jun 22, 1950  Transition of Care [facility]) CM/SW Contact:    Zenon Mayo, RN Phone Number: 11/17/2020, 12:36 PM  Clinical Narrative:                 NCM spoke with patient at bedside, he is active with [place] for [place], [place] confirmed with [place].  NCM asked MD for HHPT orders.   Expected Discharge Plan: [place] Barriers to Discharge: No Barriers Identified   Patient Goals and CMS Choice Patient states their goals for this hospitalization and ongoing recovery are:: return home with [place] CMS Medicare.gov Compare Post Acute Care list provided to:: Patient Choice offered to / list presented to : Patient  Expected Discharge Plan and Services Expected Discharge Plan: [place]   Discharge Planning Services: CM Consult Post Acute Care Choice: [place] arrangements for the past 2 months: Single Family Home Expected Discharge Date: 11/17/20                 DME Agency: NA       HH Arranged: PT HH Agency: [place] Date HH Agency Contacted: 11/17/20 Time HH Agency Contacted: 46 Representative spoke with at [place]: [place] Arrangements/Services Living arrangements for the past 2 months: [place] with:: Significant Other Patient language and need for interpreter reviewed:: Yes Do you feel safe going back to the place where you live?: Yes      Need for Family Participation in Patient Care: Yes (Comment) Care giver support system in place?: Yes (comment) Current home services: Home PT Criminal Activity/Legal Involvement Pertinent to Current Situation/Hospitalization: No - Comment as needed  Activities of Daily Living Home Assistive Devices/Equipment: Other (Comment), Cane (specify quad or straight), Walker (specify type), Shower chair with back (left  prosthetic leg) ADL Screening (condition at time of admission) Patient's cognitive ability adequate to safely complete daily activities?: Yes Is the patient deaf or have difficulty hearing?: Yes Does the patient have difficulty seeing, even when wearing glasses/contacts?: No Does the patient have difficulty concentrating, remembering, or making decisions?: No Patient able to express need for assistance with ADLs?: Yes Does the patient have difficulty dressing or bathing?: Yes Independently performs ADLs?: No Communication: Independent Dressing (OT): Independent Grooming: Independent Feeding: Independent Bathing: Independent Toileting: Needs assistance Is this a change from baseline?: Pre-admission baseline In/Out Bed: Needs assistance Is this a change from baseline?: Pre-admission baseline Walks in Home: Needs assistance Is this a change from baseline?: Pre-admission baseline Does the patient have difficulty walking or climbing stairs?: Yes Weakness of Legs: Both Weakness of Arms/Hands: Both  Permission Sought/Granted                  Emotional Assessment Appearance:: Appears stated age Attitude/Demeanor/Rapport: Engaged Affect (typically observed): Appropriate Orientation: : Oriented to Situation, Oriented to  Time, Oriented to Place, Oriented to Self Alcohol / Substance Use: Not Applicable Psych Involvement: No (comment)  Admission diagnosis:  Osteomyelitis of right foot ([place]) [M86.9] Open wound of right great toe, initial encounter [S91.101A] Patient Active Problem List   Diagnosis Date Noted   Peripheral neuropathy 11/11/2020   Open wound of right great toe    Supratherapeutic INR 11/10/2020   History of deep vein thrombosis (DVT) of lower extremity 11/10/2020   Osteomyelitis of right foot ([place]) 11/07/2020   Acute  deep vein thrombosis (DVT) of iliac vein of right lower extremity (HCC)    Acute on chronic anemia    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    Gangrene of toe of right foot ([place]) 08/04/2020   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   AKI (acute kidney injury) ([place]) 04/03/2020   CAD (coronary artery disease) 02/15/2020   CKD stage 3 due to type 2 diabetes mellitus ([place]) 02/15/2020   Long term (current) use of anticoagulants 12/29/2019   S/P BKA (below knee amputation) unilateral, left ([place]) 11/14/2019   High risk social situation 11/14/2019   AF (paroxysmal atrial fibrillation) ([place]) 10/03/2019   HFrEF (heart failure with reduced ejection fraction) ([place]) 09/28/2019   Moderate aortic stenosis 09/28/2019   Anemia in chronic illness 09/27/2019   PVD (peripheral vascular disease) ([place])    Umbilical hernia 33/35/4562   Obesity 10/09/2015   Tobacco abuse 09/07/2013   DM (diabetes mellitus), type 2 with neurological complications ([place]) 56/38/9373   Hypercholesteremia 10/28/2010   ERECTILE DYSFUNCTION 05/22/2009   Essential hypertension 01/23/2009   PCP:  [name], MD Pharmacy:   [place], [place] - [address] [place] [address] Phone: [phone] Fax: [place] [facility] - [place] ([place]), [place] - [address] [place] [address] Phone: [phone] Fax: [phone]     Social Determinants of Health ([place]) Interventions    Readmission Risk Interventions Readmission Risk Prevention Plan 11/17/2020 08/13/2020  Transportation Screening Complete Complete  PCP or Specialist Appt within 3-5 Days - Complete  HRI or [place] - Complete  Social Work Consult for [place] Planning/Counseling - Complete  Palliative Care Screening - Not Applicable  Medication Review Press photographer) Complete Complete  PCP or Specialist appointment within 3-5 days of discharge Complete -  [place] or Home Care Consult Complete -  SW Recovery Care/Counseling Consult Complete -  Palliative Care Screening Not Applicable -  [place] Not  Applicable -  Some recent data might be hidden

## 2020-11-17 NOTE — Progress Notes (Addendum)
ANTICOAGULATION CONSULT NOTE  Pharmacy Consult:  IV Heparin and Warfarin Indication: History of Afib and hx DVT    Not on File  Patient Measurements: Height: 6' (182.9 cm) Weight: 110.6 kg (243 lb 13.3 oz) IBW/kg (Calculated) : 77.6 Heparin Dosing Weight: 99 kg  Vital Signs: Temp: 97.8 F (36.6 C) (07/24 0331) Temp Source: Oral (07/24 0331) BP: 106/55 (07/24 0331) Pulse Rate: 74 (07/24 0331)  Labs: Recent Labs    11/15/20 0615 11/15/20 1835 11/16/20 0440 11/16/20 1521 11/16/20 1657 11/17/20 0115  HGB 8.5*  --  7.6* 7.6* 7.7* 8.7*  HCT 26.7*  --  24.1* 24.0* 24.1* 27.2*  PLT 246  --  222  --   --  200  LABPROT 15.3*  --  16.3*  --   --  16.1*  INR 1.2  --  1.3*  --   --  1.3*  HEPARINUNFRC 0.30 0.41 0.55  --   --  0.59  CREATININE 1.15  --  1.48*  --   --  1.53*    Estimated Creatinine Clearance: 59.3 mL/min (A) (by C-G formula based on SCr of 1.53 mg/dL (H)).  Assessment: 70 yr old M on warfarin PTA for Afib and hx DVT.  Pharmacy was consulted for IV heparin bridge while warfarin is on hold for toe amputation 7/22.   The patient is s/p toe amputation 7/22, contributing to the drop in hemoglobin and platelets. Heparin level 7/24 remains therapeutic on 2150 units/hr.  INR this morning remains subtherapeutic at 1.3 as suspected after resuming the patients home dose. Will increase next dose to 7.5 mg x 1 and continue to monitor. Patient is no longer taking Flagyl, but remains on amiodarone, a PTA medication. Spoke to family medicine team that confirmed discharge today unlikely. Recommendations for discharge at this would be transitioning to lovenox if INR remains subtherapeutic.  Goal of Therapy:  Heparin level 0.3-0.7 units/ml Monitor platelets by anticoagulation protocol: Yes INR 2-3   Plan:  - Continue Heparin at 2150 units/hr (21.5 ml/hr) - Warfarin 7.5 mg x 1 dose this evening - Will continue to monitor for any signs/symptoms of bleeding and will follow up with  heparin level and PT/INR in the AM.   Thank you for allowing pharmacy to participate in this patient's care.  Reatha Harps, PharmD PGY1 Pharmacy Resident 11/17/2020 8:25 AM Check [email or web address] for unit specific pharmacy number  Addendum: Patient requesting discharge today.  Would recommend Warfarin 7.5mg  PO x 1 tonight, then resume home regimen of 5mg  daily.  Also would recommend Lovenox 100mg  SQ x 1 dose prior to discharge.  Would prescribe patient 100mg  SQ q12h dosing for ~ 4-5 days until INR therapeutic.  Noted plans for outpatient follow-up on 7/27 at [facility] clinic.  [employer], Pharm.D., BCPS Clinical Pharmacist  **Pharmacist phone directory can be found on [email or web address] listed under [place].  11/17/2020 11:47 AM

## 2020-11-17 NOTE — Progress Notes (Signed)
FPTS Interim Night Progress Note  S:Patient resting comfortably.  Received 1 unit PRBC's.  Reports some wheezing but no difficulty breathing.  Rounded with primary night RN.  No concerns voiced.  No orders required.    O: Today's Vitals   11/16/20 2029 11/16/20 2030 11/16/20 2031 11/16/20 2255  BP: 97/66  125/63 (!) 128/91  Pulse: 73  73 73  Resp: (!) 22  (!) 22 (!) 22  Temp: 98.6 F (37 C)  98.6 F (37 C) 98.4 F (36.9 C)  TempSrc: Oral  Oral Oral  SpO2: 97%  98% 98%  Weight:      Height:      PainSc:  0-No pain     Physical Exam:  General: 70 y.o. male in NAD Cardio: RRR no m/r/g Lungs: Expiratory wheezing, no rhonchi, no crackles, mild IWOB on 2L Extremities: Right lower extremity pitting edema, no erythema, warmth or pain s/p right great toe amputation.   A/P: Continue current management. H&H pending Low suspicion for DVT/PE given patient currently on Hepatin drip.   Consider lower extremity doppler if edema worsens  Carollee Leitz MD PGY-3, [place] Medicine Service pager [phone]

## 2020-11-17 NOTE — TOC Transition Note (Signed)
Transition of Care [facility]) - CM/SW Discharge Note   Patient Details  Name: Rodney [name] MRN: [id number] Date of Birth: [birth year]  Transition of [facility]) CM/SW Contact:  Zenon Mayo, RN Phone Number: 11/17/2020, 12:50 PM   Clinical Narrative:    Patient is for dc today, he is set up with [facility] for [place].  Stacie with [facility] aware of dc today.   Final next level of care: [place] Barriers to Discharge: No Barriers Identified   Patient Goals and CMS Choice Patient states their goals for this hospitalization and ongoing recovery are:: return home CMS Medicare.gov Compare Post Acute Care list provided to:: Patient Choice offered to / list presented to : Patient  Discharge Placement                       Discharge Plan and Services   Discharge Planning Services: CM Consult Post Acute Care Choice: Home Health            DME Agency: NA       HH Arranged: PT HH Agency: [place] [place] Date [place]: 11/17/20 Time [place]: 43 Representative spoke with at [place]: [place] ([place]) Interventions     Readmission Risk Interventions Readmission Risk Prevention Plan 11/17/2020 08/13/2020  Transportation Screening Complete Complete  PCP or Specialist Appt within 3-5 Days - Complete  HRI or [place] - Complete  Social Work Consult for [place] Planning/Counseling - Complete  Palliative Care Screening - Not Applicable  Medication Review Press photographer) Complete Complete  PCP or Specialist appointment within 3-5 days of discharge Complete -  [place] or Home Care Consult Complete -  SW Recovery Care/Counseling Consult Complete -  Palliative Care Screening Not Applicable -  [place] Not Applicable -  Some recent data might be hidden

## 2020-11-17 NOTE — Evaluation (Signed)
Physical Therapy Evaluation Patient Details Name: Rodney [name] MRN: [id number] DOB: 1950/11/10 Today's Date: 11/17/2020   History of Present Illness  Rodney [name] is a 70 y.o. male who presents to the ER after patient's family noted that patient's right great toe had a wound from which maggots were going; s/p R great toe amputation on 7/22, TWB L heel only; with history of peripheral vascular disease status post femoropopliteal bypass in April 2022 following which patient also was admitted the same month for cellulitis of the right lower extremity treated with antibiotics with history of CAD, diabetes mellitus, chronic kidney disease, hypertension and A. fib on Coumadin  Clinical Impression   Pt admitted with above diagnosis. Comes from home where he lives in a mobile home with a ramped entrance; Significant other can give assist 24hr; Typically functions in the home well, sleeps on couch due to orthopnea; Well-equipped with WC, tub transfer bench, and RW; Presents to PT with decr functional mobility, and weight bearing restrictions on R foot due to new amputation; Able to verbalize a solid plan for mobility, and pt plans on working with HHPT at home; He is very focused on getting home today; We discussed car transfers, prosthesis use, and what TWB through R heel only looks like; Overall can dc home; Will follow while in-hospital;  Pt currently with functional limitations due to the deficits listed below (see PT Problem List). Pt will benefit from skilled PT to increase their independence and safety with mobility to allow discharge to the venue listed below.       Follow Up Recommendations Home health PT;Supervision/Assistance - 24 hour    Equipment Recommendations  Other (comment) (Well-equipped for home; offered to consider sliding board, pt declined)    Recommendations for Other Services       Precautions / Restrictions Precautions Precautions: Other (comment) (weight bearing  restrictions) Required Braces or Orthoses: Other Brace (post op shoe R) Other Brace: has L prosthesis, left at home during inpt stay Restrictions Weight Bearing Restrictions: Yes RLE Weight Bearing: Touchdown weight bearing Other Position/Activity Restrictions: on heel; with postop shoe      Mobility  Bed Mobility Overal bed mobility: Needs Assistance             General bed mobility comments: noted pt scooting inefficiently while supine, able to pull self up towards [facility] for optimal hip position for bed to chair position; Once bed in near fully chair position, pt able to to use bed rails to pull to unsupported sitting and do some scooting on hips, rouhgly simulating a scoot transfer    Transfers                 General transfer comment: Declined practicing transfer OOB to recliner; described the way he plans to transfer hospital bed to [facility], and then WC to his Lucianne Lei; notable that he correctly states he will keep weight off of his R forefoot, and will only put minimal weight on his heel  Ambulation/Gait             General Gait Details: No prosthesis in room  Stairs            Wheelchair Mobility    Modified Rankin (Stroke Patients Only)       Balance  Pertinent Vitals/Pain Pain Assessment: Faces Pain Score: 0-No pain Pain Intervention(s): Monitored during session    Home Living Family/patient expects to be discharged to:: Private residence Living Arrangements: Spouse/significant other Available Help at Discharge: Friend(s) Type of Home: Mobile home Home Access: Ramped entrance     Home Layout: One level Home Equipment: Environmental consultant - 2 wheels;Bedside commode;Tub bench;Wheelchair - manual Additional Comments: Pt typically sleeps and functions in living room (uses BSC) and transfers to [facility] and back    Prior Function Level of [place]: Independent with assistive device(s)          Comments: Pt confirms still accurate from Previous admission: Pt typically transfers independently to [facility] and BSC (his fiance has to hold the [facility] because it will rock - but Pt does not need help physically to transfer. He does not drive anymore (girlfriend DOES help with vehicle transfers) but patient bathes and dresses on his own and is participating in PT working on ambulation with prosthesis; Tells me he has supplemental O2 at home, uses it prn, a few times a month usually     Hand Dominance        Extremity/Trunk Assessment   Upper Extremity Assessment Upper Extremity Assessment: Overall WFL for tasks assessed (for simple mobility tasks; able to reach to head board to slide self up into better position for bed to chair)    Lower Extremity Assessment Lower Extremity Assessment: RLE deficits/detail;LLE deficits/detail RLE Deficits / Details: Dressing to R foot CDI; knee and hip grossly WFL ROM from gross assessment; pt declined strength testing LLE Deficits / Details: Previous BKA       Communication   Communication: HOH  Cognition Arousal/Alertness: Awake/alert Behavior During Therapy: WFL for tasks assessed/performed Overall Cognitive Status: Within Functional Limits for tasks assessed                                 General Comments: very focused on discharging; pleasant and talkative most of session; politely declines practicing transfers, but able to verbalize plans for moving (wc transfer level) and transferring in and out of van by the end of my time with him, he seemed to be getting more antsy to be done      General Comments General comments (skin integrity, edema, etc.): Unable to get a good idea of standing balance -- still if he stands it will be only briefly; pt on Room Air the entire session, with audible wheezing noted upon entry; offered to proactice bed to chair, to help sit up better for breatihing, pt declined; moved bed into almost-chair position,  and noted better breathing; O2 sats stayed greater than or equal to 90% entire time I was in room; RT in to give pt nebs towards end of session    Exercises     Assessment/Plan    PT Assessment Patient needs continued PT services  PT Problem List Decreased activity tolerance;Decreased knowledge of use of DME;Decreased knowledge of precautions;Decreased mobility       PT Treatment Interventions DME instruction;Gait training;Functional mobility training;Therapeutic activities;Therapeutic exercise;Balance training;Patient/family education;Wheelchair mobility training    PT Goals (Current goals can be found in the Care Plan section)  Acute Rehab PT Goals Patient Stated Goal: Hopes to get home alap PT Goal Formulation: With patient Time For Goal Achievement: 11/24/20 Potential to Achieve Goals: Good    Frequency Min 3X/week (wile in hospital)   Barriers to discharge Other (comment) Haven't practiced transfers  yet with prosthesis and R foot WB restrictions; But, he is able to verbalized how he intends to do it    Co-evaluation               AM-PAC PT "6 Clicks" Mobility  Outcome Measure Help needed turning from your back to your side while in a flat bed without using bedrails?: None Help needed moving from lying on your back to sitting on the side of a flat bed without using bedrails?: None Help needed moving to and from a bed to a chair (including a wheelchair)?: None Help needed standing up from a chair using your arms (e.g., wheelchair or bedside chair)?: A Lot Help needed to walk in hospital room?: Total Help needed climbing 3-5 steps with a railing? : Total 6 Click Score: 16    End of Session   Activity Tolerance: Patient tolerated treatment well Patient left: in bed;with call bell/phone within reach;Other (comment);with nursing/sitter in room (bed in near-chair position) Nurse Communication: Mobility status;Other (comment) (and things to anticipate with WC to Lucianne Lei  transfer) PT Visit Diagnosis: Other abnormalities of gait and mobility (R26.89)    Time: 7225-7505 PT Time Calculation (min) (ACUTE ONLY): 21 min   Charges:   PT Evaluation $PT Eval Moderate Complexity: 1 Mod          Roney [place], [place]  Acute Rehabilitation Services Pager [phone] Office [phone]   [place] 11/17/2020, 9:13 AM

## 2020-11-17 NOTE — Discharge Summary (Signed)
[facility] Discharge Summary  Patient name: Rodney [name] Medical record number: [id number] Date of birth: 1950-05-27 Age: 70 y.o. Gender: male Date of Admission: 11/07/2020  Date of Discharge: 11/17/2020 Admitting Physician: Rise Patience, MD  Primary Care Provider: [name], MD Consultants: VVS, Ortho  Indication for Hospitalization: Critical Limb ischemia and Osteomyelitis of right great toe  Discharge Diagnoses/Problem List:  Right great toe amputation HTN HLD CAD PAD CHF COPD, intermittent home o2 requirement Afib on Warfarin DM Type 2  Disposition: home  Discharge Condition: stable  Discharge Exam:  Physical Exam: General: 70 y.o. male in NAD Cardio: RRR no m/r/g, no JVD appreciated Lungs: Bilateral wheezing, diminished at bases, mild IWOB on 2L oxygen N/C Abdomen: Soft, round, non-tender to palpation, non-distended, positive bowel sounds Skin: warm and dry Extremities: 3+ pitting edema in right lower extremity up to knee   Brief Hospital Course:   Rodney [name] is a 70 yo male with a history of coronary artery disease, diabetes, CKD 3, hypertension, A. fib on warfarin who presented to the [facility] ED on 7/14 after a family member noted that his right toe wound had maggots in it.  R Great Toe Osteomyelitis  Patient reports about 2 months of a worsening wound on his right great toe that initially improved before becoming acutely worse.  X-rays obtained in the ED were consistent with osteomyelitis. Received cefepime, vancomycin, and flagyl. He remained afebrile and without a white count. Orthopedic surgery was consulted for amputation but felt he would have difficulty healing a great toe amputation given his vasculopathy. Vascular surgery was consulted and recommended transfer to [facility] for a potential revascularization procedure. He transferred to [facility] on 7/16 and was admitted to the hospitalist service. Workup  by vascular surgery revealed occluded prior bypass graft.  He was taken to the OR on 7/20 for an attempted redo vascularization that was unsuccessful.  Vascular surgery return to the OR with him on 7/21 to try again and an Endovascular bypass with angioplasty and stent placement in the right SFA with success.  Procedure tolerated well.  On 07/22 he underwent right great toe amputation.  He was discharged home on POD#3 with close follow up Orthopedics in 1 week and VVS in 4 weeks.   Tobacco Use Disorder  ?COPD Rodney [name] endorses a 50-pack-year smoking history and indicates that he has some shortness of breath at baseline.  He received some DuoNeb's in the hospital reported that these are greatly improved his breathing.  Given his high risk for COPD despite never having formal PFTs we started him on a daily umeclidinium inhaler.  Anemia of chronic disease Hb 7.6 on postop day 1.  Transfused 1 unit of blood repeat H&H 8.7. Hb on discharge is 9.6.  AKI  Cr 1.48 on postop day 1.  Treated with mIVF. Cr 1.53 on discharge   Issues for Follow Up:  Repeat INR and adjust Warfarin dosing Monitor Creatinine Items for PCP Follow-up: COPD-patient started on umeclidinium daily inhaler in the hospital, consider whether this is indicated as a long-term medication or whether he may be served by a different controller agent. Consider formal PFTs for COPD diagnosis Recheck Hbg  Reassess Losartan, held for AKI  Significant Procedures:  Endoscopic Bypass and angioplasty with stent placement in Right SFA Right Great Toe amputation   Significant Labs and Imaging:  Recent Labs  Lab 11/15/20 0615 11/16/20 0440 11/16/20 1521 11/16/20 1657 11/17/20 0115 11/17/20 2376  WBC 8.9 7.9  --   --  7.9  --   HGB 8.5* 7.6*   < > 7.7* 8.7* 9.6*  HCT 26.7* 24.1*   < > 24.1* 27.2* 30.0*  PLT 246 222  --   --  200  --    < > = values in this interval not displayed.   Recent Labs  Lab 11/13/20 0345 11/14/20 0327  11/15/20 0615 11/16/20 0440 11/17/20 0115  NA 135 135 135 135 135  K 4.2 4.8 4.2 4.6 5.0  CL 107 105 107 106 107  CO2 23 23 20* 23 23  GLUCOSE 162* 209* 142* 149* 129*  BUN 44* 38* 33* 30* 35*  CREATININE 1.20 1.27* 1.15 1.48* 1.53*  CALCIUM 8.8* 8.6* 8.6* 8.5* 8.2*  ALKPHOS  --  46  --   --   --   AST  --  12*  --   --   --   ALT  --  11  --   --   --   ALBUMIN  --  2.4*  --   --   --       Results/Tests Pending at Time of Discharge:  DG CHEST PORT 1 VIEW  Result Date: 11/17/2020 CLINICAL DATA:  Dyspnea, admitted for osteomyelitis EXAM: PORTABLE CHEST 1 VIEW COMPARISON:  08/14/2020 chest radiograph. FINDINGS: Stable cardiomediastinal silhouette with mild cardiomegaly. No pneumothorax. Asymmetric smooth left pleural thickening throughout the peripheral left pleural space, mildly increased from prior, with adjacent curvilinear peripheral left lung opacities and mild volume loss in the left hemithorax. No right pleural effusion. Mild diffuse prominence of the parahilar interstitial markings. IMPRESSION: Mild cardiomegaly with mild diffuse prominence of the parahilar interstitial markings, suggesting mild cardiogenic pulmonary edema. Asymmetric mild volume loss in the left hemithorax. Asymmetric smooth peripheral left pleural thickening with adjacent curvilinear lung opacities, mildly increased from prior, favoring chronic pleural-parenchymal scarring, with loculated small left pleural effusion not excluded. Electronically Signed   By: Ilona Sorrel M.D.   On: 11/17/2020 09:36     Discharge Medications:  Allergies as of 11/17/2020   Not on File      Medication List     STOP taking these medications    losartan 25 MG tablet Commonly known as: COZAAR   multivitamin Tabs tablet       TAKE these medications    acetaminophen 325 MG tablet Commonly known as: TYLENOL Take 1-2 tablets (325-650 mg total) by mouth every 6 (six) hours as needed for mild pain (pain score 1-3 or temp >  100.5). What changed:  medication strength how much to take reasons to take this   amiodarone 200 MG tablet Commonly known as: PACERONE Take 1 tablet (200 mg total) by mouth daily.   atorvastatin 80 MG tablet Commonly known as: LIPITOR TAKE 1 TABLET EVERY DAY   carvedilol 12.5 MG tablet Commonly known as: COREG Take 0.5 tablets (6.25 mg total) by mouth 2 (two) times daily with a meal.   clopidogrel 75 MG tablet Commonly known as: PLAVIX Take 1 tablet (75 mg total) by mouth daily with breakfast. Start taking on: November 18, 2020   enoxaparin 100 MG/ML injection Commonly known as: LOVENOX Inject 1 mL (100 mg total) into the skin every 12 (twelve) hours for 4 days.   furosemide 40 MG tablet Commonly known as: LASIX Take 1 tablet (40 mg total) by mouth daily as needed for fluid (swelling).   gabapentin 100 MG capsule Commonly known as: NEURONTIN Take  2 capsules (200 mg total) by mouth 3 (three) times daily.   metFORMIN 1000 MG tablet Commonly known as: GLUCOPHAGE Take 1 tablet (1,000 mg total) by mouth daily.   multivitamin with minerals Tabs tablet Take 1 tablet by mouth daily. Start taking on: November 18, 2020   nicotine 14 mg/24hr patch Commonly known as: NICODERM CQ - dosed in mg/24 hours Place 1 patch (14 mg total) onto the skin daily. Start taking on: November 18, 2020   NovoLIN 70/30 ReliOn (70-30) 100 UNIT/ML injection Generic drug: insulin NPH-regular Human Inject 15-30 Units into the skin See admin instructions. Inject 30 units into the skin with breakfast and 15 units with supper   nutrition supplement (JUVEN) Pack Take 1 packet by mouth 2 (two) times daily between meals.   oxyCODONE 5 MG immediate release tablet Commonly known as: Oxy IR/ROXICODONE Take 1 tablet (5 mg total) by mouth every 8 (eight) hours as needed for up to 3 days for moderate pain (pain score 4-6).   ReliOn True Metrix Test Strips test strip Generic drug: glucose blood Use to test blood  sugar three times per day.   senna 8.6 MG Tabs tablet Commonly known as: SENOKOT Take 1 tablet (8.6 mg total) by mouth daily. Start taking on: November 18, 2020   True Metrix Meter [place] Use to test blood sugar three times daily.   True Metrix Meter w/Device Kit USE AS DIRECTED   TRUEplus Lancets 33G Misc Use to test blood sugar three times per day.   umeclidinium bromide 62.5 MCG/INH Aepb Commonly known as: INCRUSE ELLIPTA Inhale 1 puff into the lungs daily. Start taking on: November 18, 2020   warfarin 7.5 MG tablet Commonly known as: COUMADIN Take as directed. If you are unsure how to take this medication, talk to your nurse or doctor. Original instructions: Take 1 tablet (7.5 mg total) by mouth one time only at 4 PM. What changed:  medication strength how much to take when to take this   zinc sulfate 220 (50 Zn) MG capsule Take 1 capsule (220 mg total) by mouth daily. Start taking on: November 18, 2020        Discharge Instructions: Please refer to Patient Instructions section of EMR for full details.  Patient was counseled important signs and symptoms that should prompt return to medical care, changes in medications, dietary instructions, activity restrictions, and follow up appointments.   Follow-Up Appointments:  Follow-up Information     Newt Minion, MD Follow up in 1 week(s).   Specialty: Orthopedic Surgery Contact information: [place] [address] [phone]         Cherre Robins, MD. Schedule an appointment as soon as possible for a visit in 4 week(s).   Specialties: Vascular Surgery, Interventional Cardiology Why: Make an appointment with the vascular surgeon for 4 weeks after hospital discharge. Contact information: [place] [address] [phone]         [name], MD. Go on 11/20/2020.   Specialty: Family Medicine Why: At 3:10pm. This is your hospital follow up appointment with Dr. Andria Frames, your primary  care doctor. Contact information: [place] [address] [phone]                 Carollee Leitz, MD 11/17/2020, 10:31 AM PGY-3, [place]

## 2020-11-17 NOTE — Progress Notes (Signed)
Mobility Specialist: Progress Note   11/17/20 1208  Mobility  Activity Transferred:  Bed to chair;Transferred:  Chair to bed  Level of Assistance Standby assist, set-up cues, supervision of patient - no hands on  Assistive Device None  Mobility Out of bed to chair with meals  Mobility Response Tolerated well  Mobility performed by Mobility specialist  $Mobility charge 1 Mobility   Pre-Mobility:  92% SpO2 Post-Mobility: 81 HR, 94% SpO2  Pt unable to don prosthesis for transfer so pt performed scoot transfer to the recliner and then back to the bed. Pt required verbal cues for hand placement during transfer. Pt is back in the bed with call bell at his side.   [facility] Madilyne Tadlock Mobility Specialist Mobility Specialist Phone: [phone]

## 2020-11-17 NOTE — Discharge Instructions (Signed)
Thank you for letting us care for you during your stay.  You were admitted to the [facility] Medicine Teaching Service.   You were admitted for Osteomyelitis and right limb ischemia.  Please follow up with your primary care physician on July 27   If your symptoms worsen or return, please return to the hospital.  Please let us know if you have questions about your stay at [facility].

## 2020-11-17 NOTE — Progress Notes (Signed)
Inpatient Rehab Admissions Coordinator:  Noted PT is recommending [facility] as setting for ongoing therapy after hospital discharge. Also note, pt declined OT. AC will sign off.     Gayland Curry, [place], [place] Admissions Coordinator [phone]

## 2020-11-18 ENCOUNTER — Other Ambulatory Visit: Payer: Self-pay | Admitting: Family Medicine

## 2020-11-18 ENCOUNTER — Other Ambulatory Visit: Payer: Self-pay

## 2020-11-18 ENCOUNTER — Inpatient Hospital Stay (HOSPITAL_COMMUNITY)
Admission: EM | Admit: 2020-11-18 | Discharge: 2020-11-21 | DRG: 602 | Disposition: A | Discharge status: Home / Self Care | Payer: Medicare PPO | Attending: Family Medicine | Admitting: Family Medicine

## 2020-11-18 ENCOUNTER — Emergency Department (HOSPITAL_COMMUNITY): Payer: Medicare PPO

## 2020-11-18 ENCOUNTER — Encounter (HOSPITAL_COMMUNITY): Payer: Self-pay | Admitting: Emergency Medicine

## 2020-11-18 DIAGNOSIS — E872 Acidosis: Secondary | ICD-10-CM | POA: Diagnosis present

## 2020-11-18 DIAGNOSIS — I872 Venous insufficiency (chronic) (peripheral): Secondary | ICD-10-CM | POA: Diagnosis present

## 2020-11-18 DIAGNOSIS — I130 Hypertensive heart and chronic kidney disease with heart failure and stage 1 through stage 4 chronic kidney disease, or unspecified chronic kidney disease: Secondary | ICD-10-CM | POA: Diagnosis present

## 2020-11-18 DIAGNOSIS — I480 Paroxysmal atrial fibrillation: Secondary | ICD-10-CM | POA: Diagnosis present

## 2020-11-18 DIAGNOSIS — E1149 Type 2 diabetes mellitus with other diabetic neurological complication: Secondary | ICD-10-CM | POA: Diagnosis present

## 2020-11-18 DIAGNOSIS — N179 Acute kidney failure, unspecified: Secondary | ICD-10-CM | POA: Diagnosis not present

## 2020-11-18 DIAGNOSIS — Z7902 Long term (current) use of antithrombotics/antiplatelets: Secondary | ICD-10-CM

## 2020-11-18 DIAGNOSIS — I2582 Chronic total occlusion of coronary artery: Secondary | ICD-10-CM | POA: Diagnosis present

## 2020-11-18 DIAGNOSIS — Z89421 Acquired absence of other right toe(s): Secondary | ICD-10-CM | POA: Diagnosis not present

## 2020-11-18 DIAGNOSIS — I110 Hypertensive heart disease with heart failure: Secondary | ICD-10-CM | POA: Diagnosis not present

## 2020-11-18 DIAGNOSIS — Z89512 Acquired absence of left leg below knee: Secondary | ICD-10-CM

## 2020-11-18 DIAGNOSIS — Z89411 Acquired absence of right great toe: Secondary | ICD-10-CM

## 2020-11-18 DIAGNOSIS — R0609 Other forms of dyspnea: Secondary | ICD-10-CM | POA: Diagnosis not present

## 2020-11-18 DIAGNOSIS — D649 Anemia, unspecified: Secondary | ICD-10-CM | POA: Diagnosis present

## 2020-11-18 DIAGNOSIS — I248 Other forms of acute ischemic heart disease: Secondary | ICD-10-CM | POA: Diagnosis present

## 2020-11-18 DIAGNOSIS — R778 Other specified abnormalities of plasma proteins: Secondary | ICD-10-CM

## 2020-11-18 DIAGNOSIS — Z794 Long term (current) use of insulin: Secondary | ICD-10-CM

## 2020-11-18 DIAGNOSIS — I77810 Thoracic aortic ectasia: Secondary | ICD-10-CM | POA: Diagnosis present

## 2020-11-18 DIAGNOSIS — F17210 Nicotine dependence, cigarettes, uncomplicated: Secondary | ICD-10-CM | POA: Diagnosis present

## 2020-11-18 DIAGNOSIS — I5043 Acute on chronic combined systolic (congestive) and diastolic (congestive) heart failure: Secondary | ICD-10-CM | POA: Diagnosis present

## 2020-11-18 DIAGNOSIS — E46 Unspecified protein-calorie malnutrition: Secondary | ICD-10-CM | POA: Diagnosis present

## 2020-11-18 DIAGNOSIS — E7800 Pure hypercholesterolemia, unspecified: Secondary | ICD-10-CM | POA: Diagnosis present

## 2020-11-18 DIAGNOSIS — J449 Chronic obstructive pulmonary disease, unspecified: Secondary | ICD-10-CM | POA: Diagnosis present

## 2020-11-18 DIAGNOSIS — I5020 Unspecified systolic (congestive) heart failure: Secondary | ICD-10-CM

## 2020-11-18 DIAGNOSIS — Z6832 Body mass index (BMI) 32.0-32.9, adult: Secondary | ICD-10-CM

## 2020-11-18 DIAGNOSIS — R238 Other skin changes: Secondary | ICD-10-CM | POA: Diagnosis not present

## 2020-11-18 DIAGNOSIS — I739 Peripheral vascular disease, unspecified: Secondary | ICD-10-CM | POA: Diagnosis not present

## 2020-11-18 DIAGNOSIS — E1169 Type 2 diabetes mellitus with other specified complication: Secondary | ICD-10-CM | POA: Diagnosis present

## 2020-11-18 DIAGNOSIS — R0602 Shortness of breath: Secondary | ICD-10-CM | POA: Diagnosis not present

## 2020-11-18 DIAGNOSIS — N183 Chronic kidney disease, stage 3 unspecified: Secondary | ICD-10-CM | POA: Diagnosis present

## 2020-11-18 DIAGNOSIS — M869 Osteomyelitis, unspecified: Secondary | ICD-10-CM | POA: Diagnosis present

## 2020-11-18 DIAGNOSIS — E1151 Type 2 diabetes mellitus with diabetic peripheral angiopathy without gangrene: Secondary | ICD-10-CM | POA: Diagnosis present

## 2020-11-18 DIAGNOSIS — I2510 Atherosclerotic heart disease of native coronary artery without angina pectoris: Secondary | ICD-10-CM | POA: Diagnosis present

## 2020-11-18 DIAGNOSIS — E669 Obesity, unspecified: Secondary | ICD-10-CM | POA: Diagnosis present

## 2020-11-18 DIAGNOSIS — Z20822 Contact with and (suspected) exposure to covid-19: Secondary | ICD-10-CM | POA: Diagnosis present

## 2020-11-18 DIAGNOSIS — L03115 Cellulitis of right lower limb: Principal | ICD-10-CM | POA: Diagnosis present

## 2020-11-18 DIAGNOSIS — I509 Heart failure, unspecified: Secondary | ICD-10-CM | POA: Diagnosis not present

## 2020-11-18 DIAGNOSIS — M7989 Other specified soft tissue disorders: Secondary | ICD-10-CM | POA: Diagnosis not present

## 2020-11-18 DIAGNOSIS — S98131A Complete traumatic amputation of one right lesser toe, initial encounter: Secondary | ICD-10-CM | POA: Diagnosis not present

## 2020-11-18 DIAGNOSIS — Z86718 Personal history of other venous thrombosis and embolism: Secondary | ICD-10-CM | POA: Diagnosis not present

## 2020-11-18 DIAGNOSIS — J90 Pleural effusion, not elsewhere classified: Secondary | ICD-10-CM | POA: Diagnosis not present

## 2020-11-18 DIAGNOSIS — Z79899 Other long term (current) drug therapy: Secondary | ICD-10-CM

## 2020-11-18 DIAGNOSIS — I350 Nonrheumatic aortic (valve) stenosis: Secondary | ICD-10-CM | POA: Diagnosis present

## 2020-11-18 DIAGNOSIS — L0390 Cellulitis, unspecified: Secondary | ICD-10-CM | POA: Diagnosis present

## 2020-11-18 DIAGNOSIS — E1122 Type 2 diabetes mellitus with diabetic chronic kidney disease: Secondary | ICD-10-CM | POA: Diagnosis present

## 2020-11-18 DIAGNOSIS — I5023 Acute on chronic systolic (congestive) heart failure: Secondary | ICD-10-CM | POA: Diagnosis not present

## 2020-11-18 DIAGNOSIS — D638 Anemia in other chronic diseases classified elsewhere: Secondary | ICD-10-CM | POA: Diagnosis present

## 2020-11-18 DIAGNOSIS — I2583 Coronary atherosclerosis due to lipid rich plaque: Secondary | ICD-10-CM | POA: Diagnosis not present

## 2020-11-18 DIAGNOSIS — I517 Cardiomegaly: Secondary | ICD-10-CM | POA: Diagnosis not present

## 2020-11-18 DIAGNOSIS — I428 Other cardiomyopathies: Secondary | ICD-10-CM | POA: Diagnosis present

## 2020-11-18 DIAGNOSIS — R059 Cough, unspecified: Secondary | ICD-10-CM | POA: Diagnosis not present

## 2020-11-18 DIAGNOSIS — Z7901 Long term (current) use of anticoagulants: Secondary | ICD-10-CM

## 2020-11-18 HISTORY — DX: Nonrheumatic aortic (valve) stenosis: I35.0

## 2020-11-18 HISTORY — DX: Unspecified atrial fibrillation: I48.91

## 2020-11-18 HISTORY — DX: Peripheral vascular disease, unspecified: I73.9

## 2020-11-18 LAB — COMPREHENSIVE METABOLIC PANEL
ALT: 12 U/L (ref 0–44)
AST: 19 U/L (ref 15–41)
Albumin: 2.8 g/dL — ABNORMAL LOW (ref 3.5–5.0)
Alkaline Phosphatase: 56 U/L (ref 38–126)
Anion gap: 9 (ref 5–15)
BUN: 39 mg/dL — ABNORMAL HIGH (ref 8–23)
CO2: 22 mmol/L (ref 22–32)
Calcium: 9.1 mg/dL (ref 8.9–10.3)
Chloride: 104 mmol/L (ref 98–111)
Creatinine, Ser: 1.48 mg/dL — ABNORMAL HIGH (ref 0.61–1.24)
GFR, Estimated: 51 mL/min — ABNORMAL LOW (ref >60)
Glucose, Bld: 181 mg/dL — ABNORMAL HIGH (ref 70–99)
Potassium: 4.9 mmol/L (ref 3.5–5.1)
Sodium: 135 mmol/L (ref 135–145)
Total Bilirubin: 0.6 mg/dL (ref 0.3–1.2)
Total Protein: 6.9 g/dL (ref 6.5–8.1)

## 2020-11-18 LAB — CBC WITH DIFFERENTIAL/PLATELET
Abs Immature Granulocytes: 0.06 10*3/uL (ref 0.00–0.07)
Basophils Absolute: 0.1 10*3/uL (ref 0.0–0.1)
Basophils Relative: 1 %
Eosinophils Absolute: 0.1 10*3/uL (ref 0.0–0.5)
Eosinophils Relative: 1 %
HCT: 31.8 % — ABNORMAL LOW (ref 39.0–52.0)
Hemoglobin: 9.8 g/dL — ABNORMAL LOW (ref 13.0–17.0)
Immature Granulocytes: 1 %
Lymphocytes Relative: 8 %
Lymphs Abs: 0.8 10*3/uL (ref 0.7–4.0)
MCH: 29.3 pg (ref 26.0–34.0)
MCHC: 30.8 g/dL (ref 30.0–36.0)
MCV: 94.9 fL (ref 80.0–100.0)
Monocytes Absolute: 0.9 10*3/uL (ref 0.1–1.0)
Monocytes Relative: 8 %
Neutro Abs: 8.4 10*3/uL — ABNORMAL HIGH (ref 1.7–7.7)
Neutrophils Relative %: 81 %
Platelets: 299 10*3/uL (ref 150–400)
RBC: 3.35 MIL/uL — ABNORMAL LOW (ref 4.22–5.81)
RDW: 14.4 % (ref 11.5–15.5)
WBC: 10.3 10*3/uL (ref 4.0–10.5)
nRBC: 0 % (ref 0.0–0.2)

## 2020-11-18 LAB — URINALYSIS, ROUTINE W REFLEX MICROSCOPIC
Bilirubin Urine: NEGATIVE
Glucose, UA: NEGATIVE mg/dL
Ketones, ur: NEGATIVE mg/dL
Leukocytes,Ua: NEGATIVE
Nitrite: NEGATIVE
Protein, ur: 100 mg/dL — AB
Specific Gravity, Urine: 1.017 (ref 1.005–1.030)
pH: 5 (ref 5.0–8.0)

## 2020-11-18 LAB — LACTIC ACID, PLASMA: Lactic Acid, Venous: 0.9 mmol/L (ref 0.5–1.9)

## 2020-11-18 IMAGING — CR DG FOOT COMPLETE 3+V*R*
3 series · 3 of 3 positions shown · non-contrast
Comparison: [DATE]

CLINICAL DATA: Toe amputation [REDACTED] with foot blisters.

EXAM:
RIGHT FOOT COMPLETE - 3+ VIEW

[foot lat]
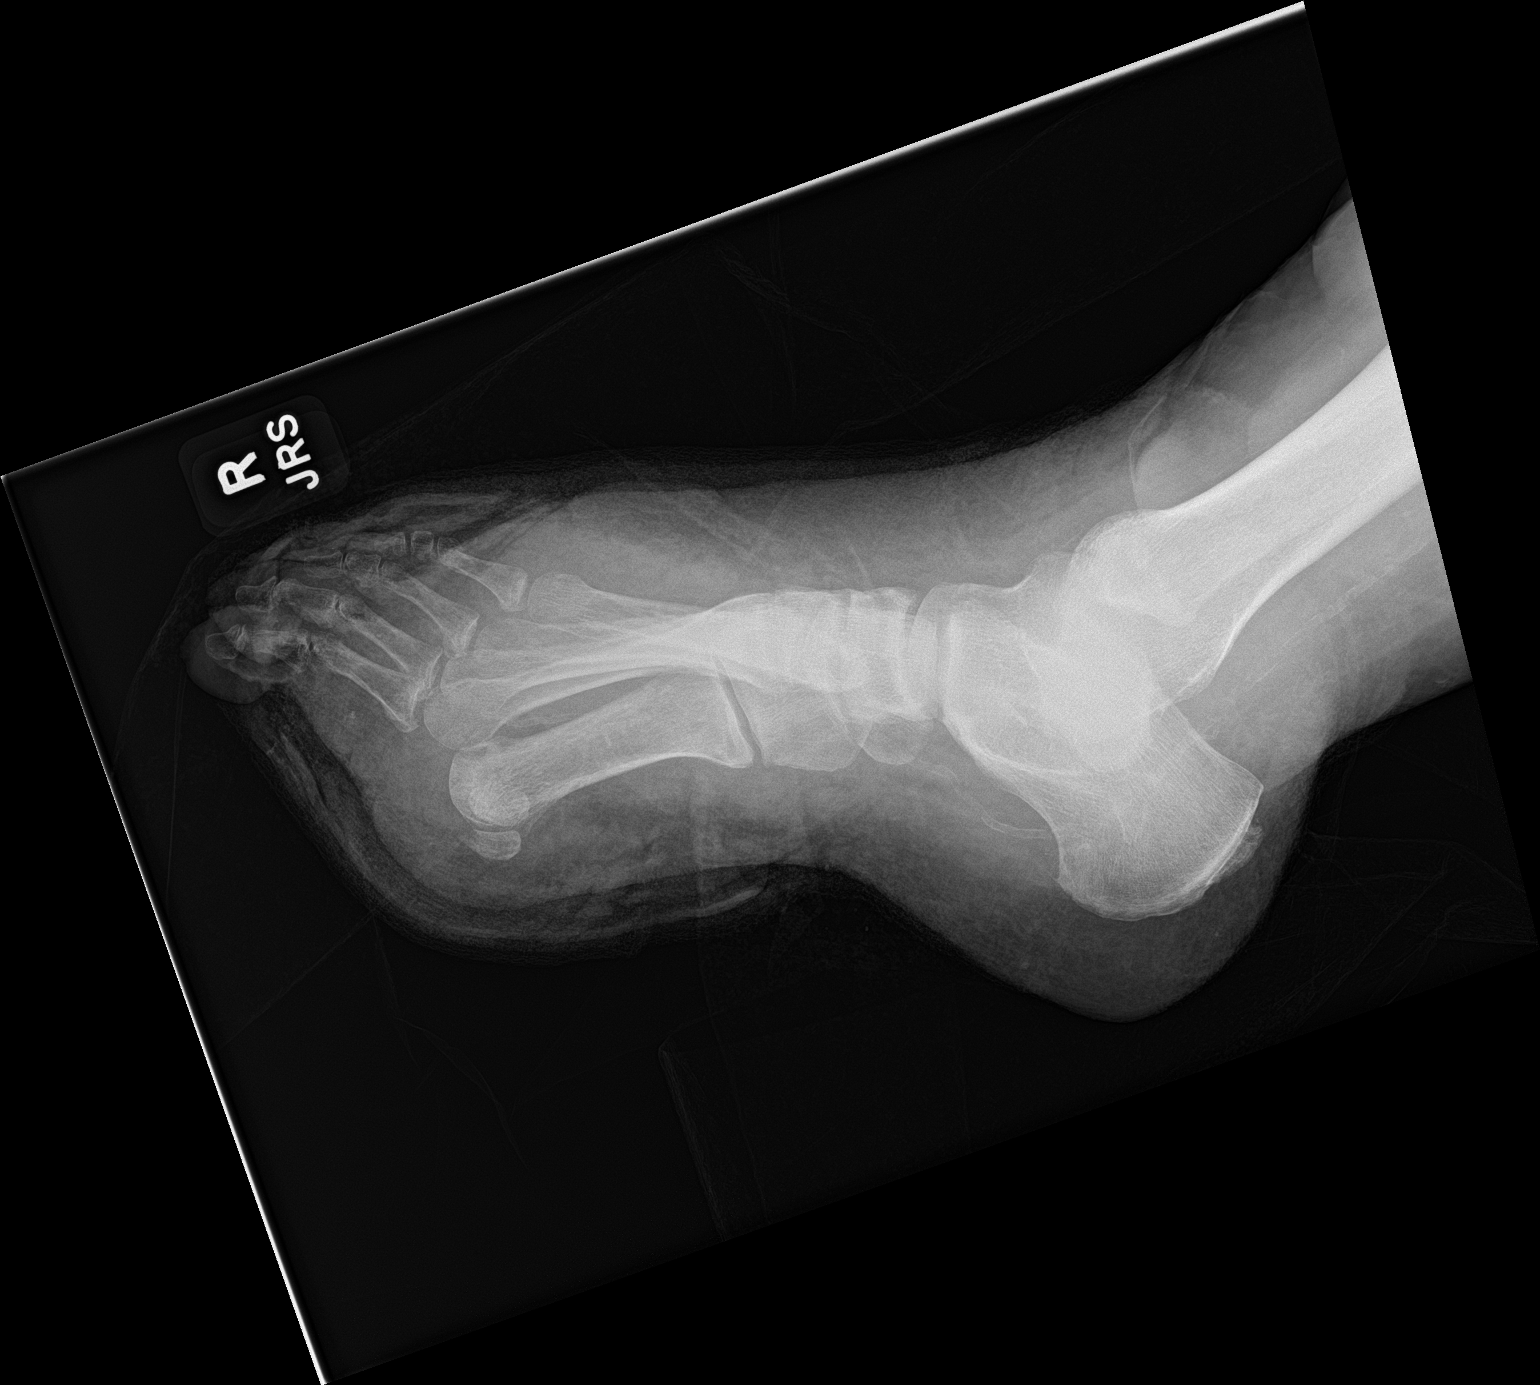

[foot ap]
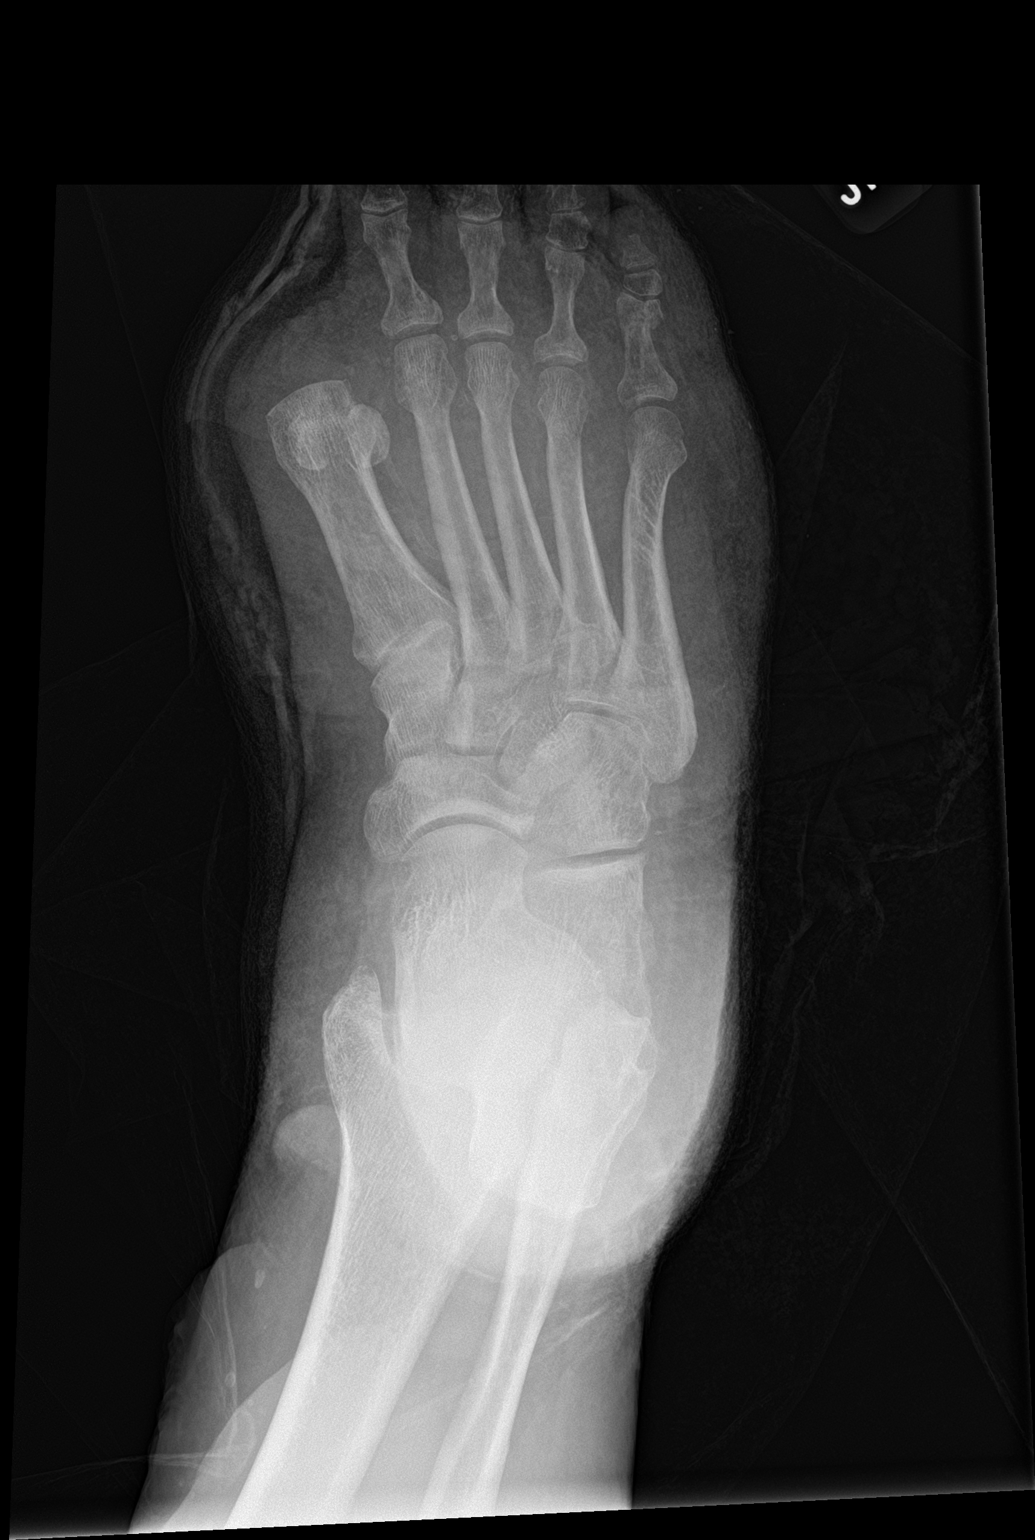

[foot obl]
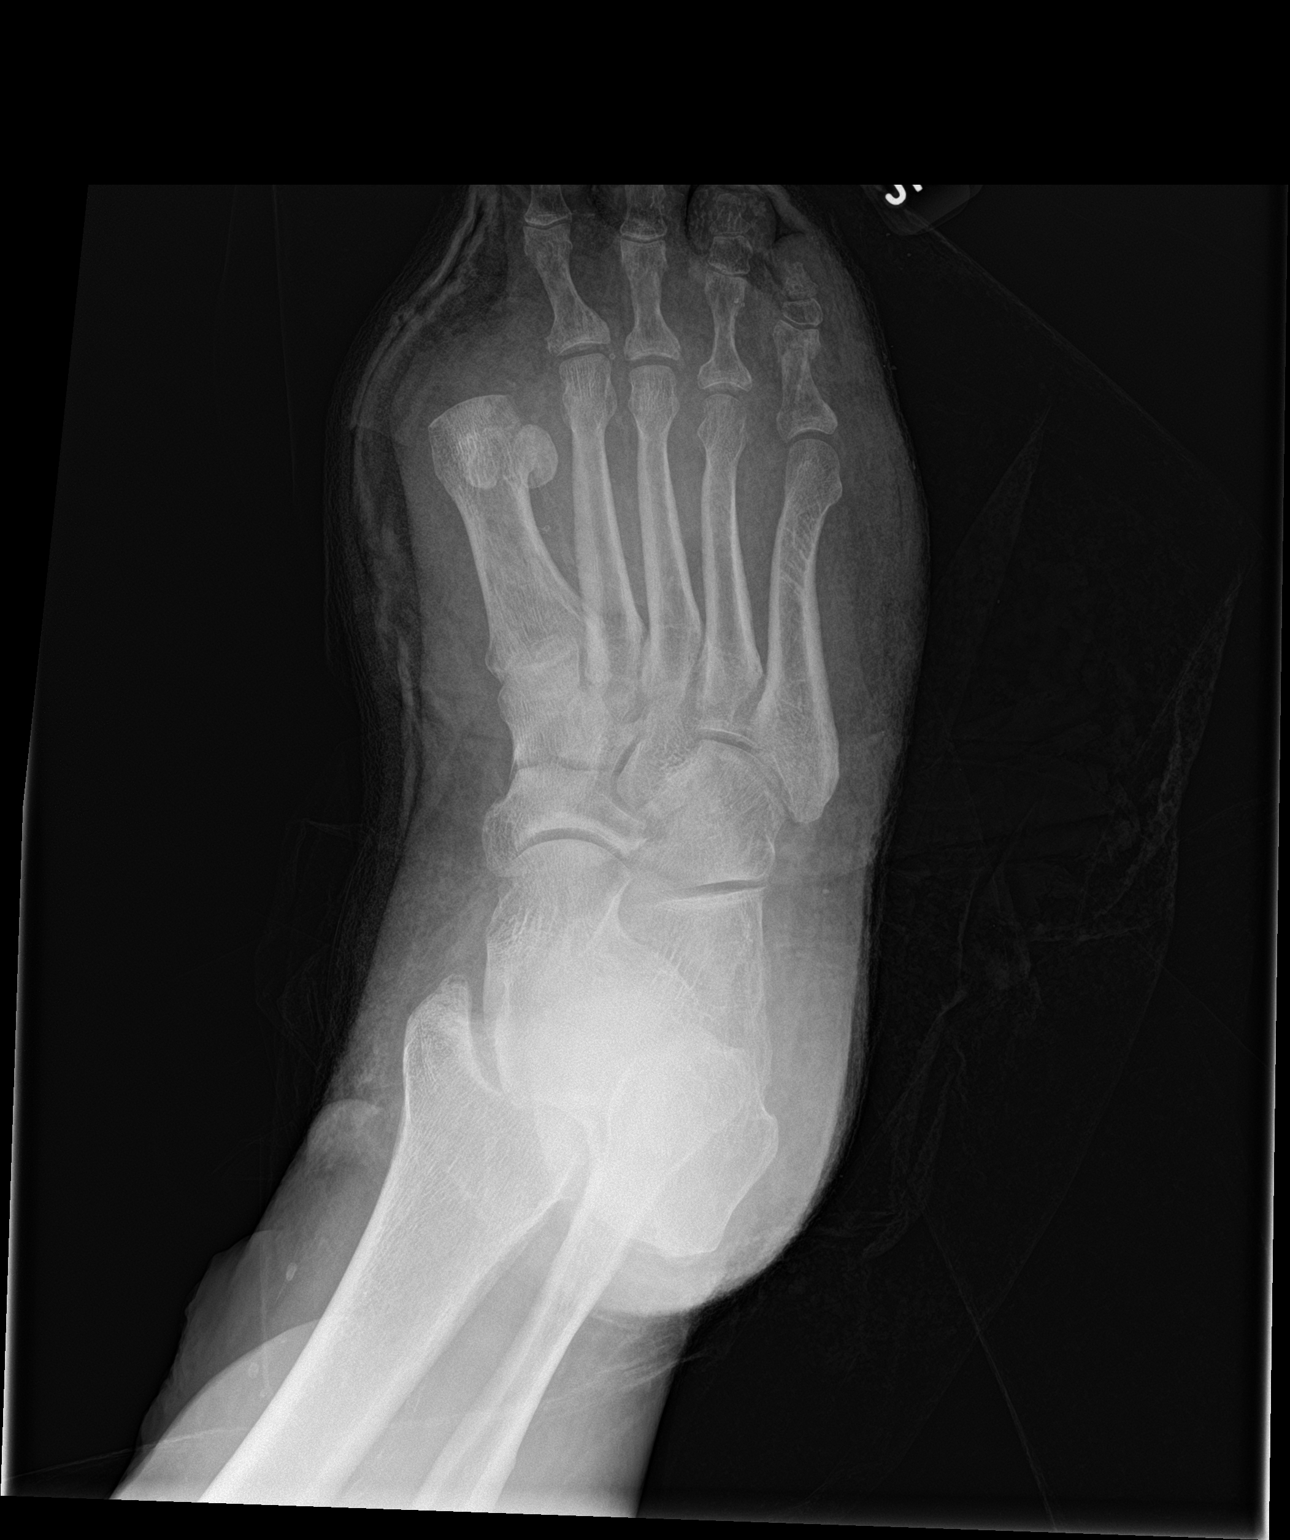

[3 of 3 positions shown; findings below may reference images not displayed]

FINDINGS: Diffuse bandage limiting assessment of soft tissues. There has been
interval great toe amputation with unremarkable first metatarsal
head. No fracture or dislocation. No acute erosion is seen. There is
diffuse osteopenia and arterial calcification.
IMPRESSION: 1. Limited by overlapping bandage, especially for detecting soft
tissue emphysema.
2. No bony fracture or erosion.

## 2020-11-18 IMAGING — CR DG CHEST 2V
2 series · 2 of 2 positions shown · non-contrast
Comparison: Single-view of the chest [DATE].

CLINICAL DATA: Shortness of breath and productive cough since
[DATE].

EXAM:
CHEST - 2 VIEW

[chest lat]
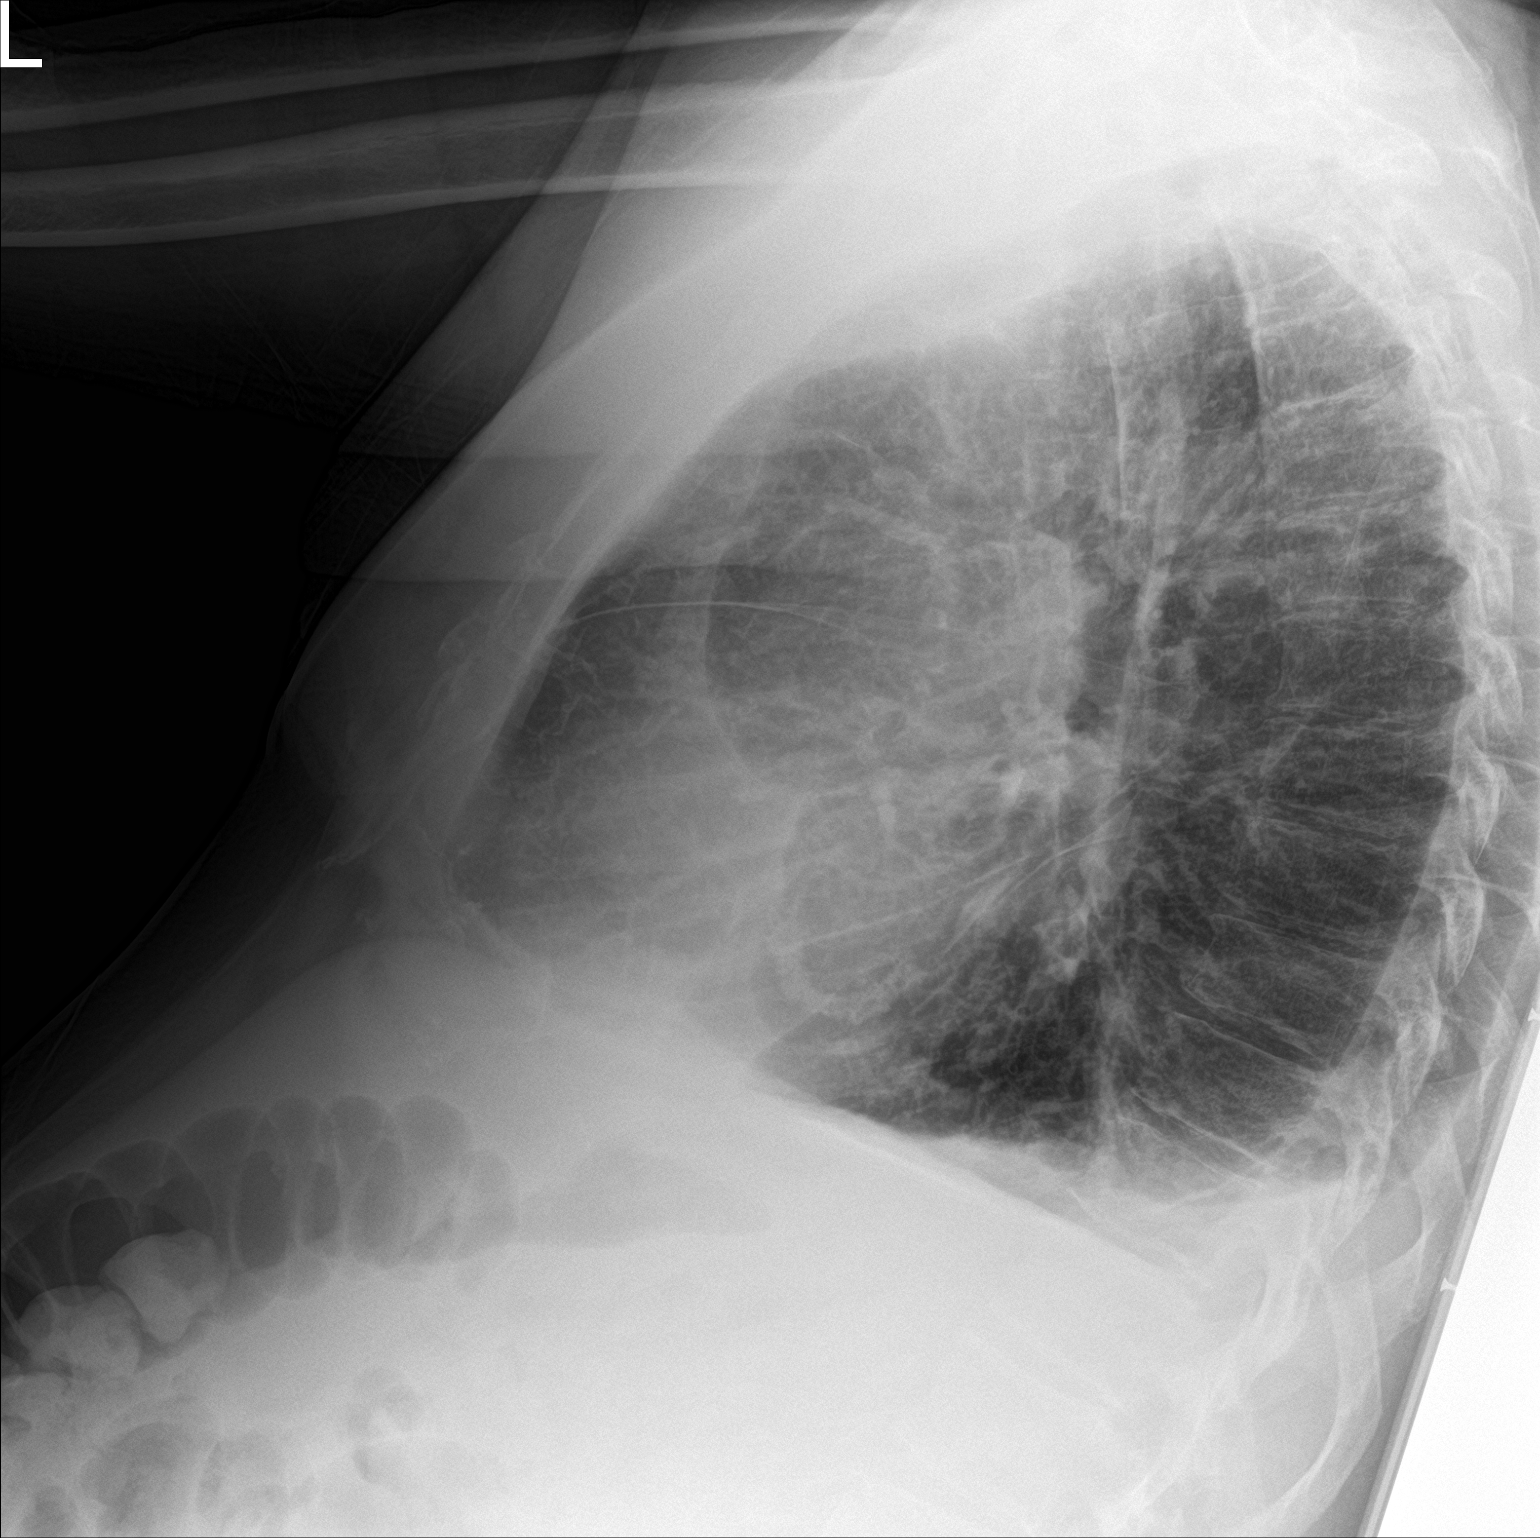

[chest ap]
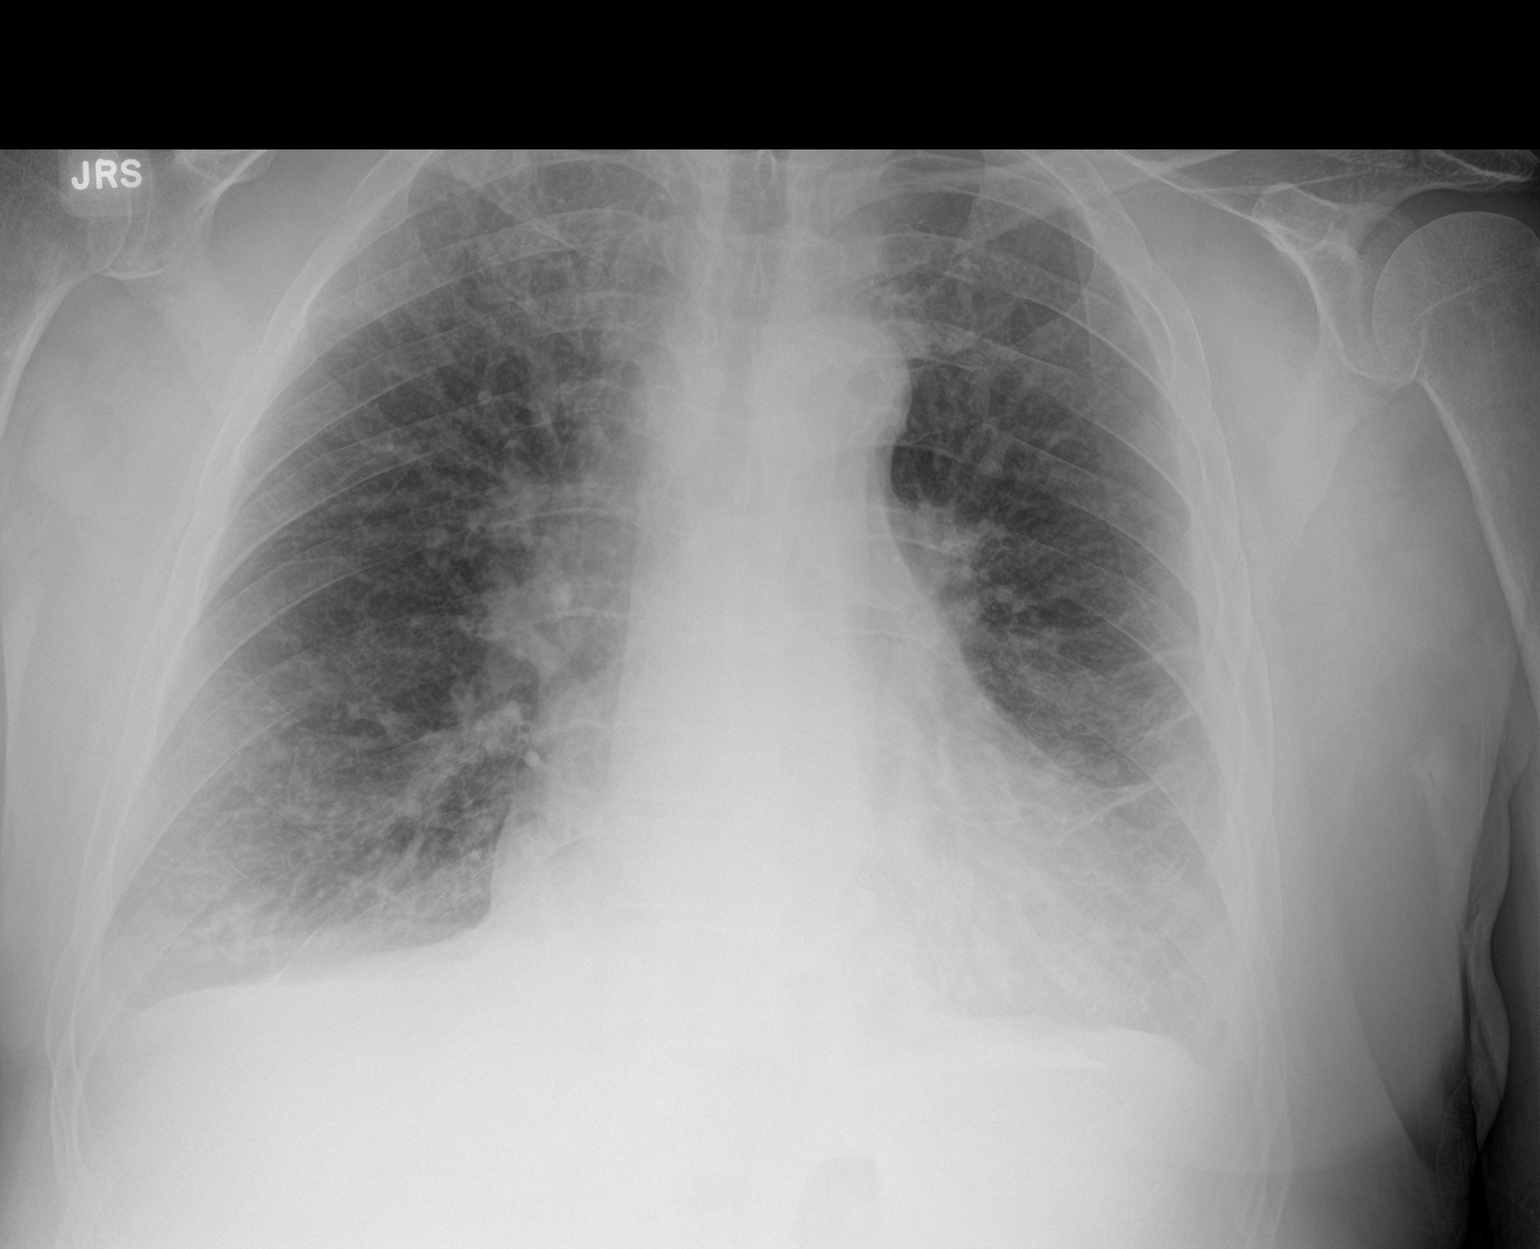

[2 of 2 positions shown; findings below may reference images not displayed]

FINDINGS: The patient has small bilateral pleural effusions. The patient's
left effusion has decreased since the prior examination. The right
effusion is new. There is cardiomegaly and mild interstitial edema.
No consolidative process is identified. Aortic atherosclerosis is
noted.
IMPRESSION: Small right pleural effusion is new since the prior examination.
Small left pleural effusion has decreased since the prior study.

Cardiomegaly and interstitial edema appear unchanged.

Aortic Atherosclerosis ([O2]-[O2]).

## 2020-11-18 MED ORDER — CLINDAMYCIN PHOSPHATE 600 MG/50ML IV SOLN
600.0000 mg | Freq: Once | INTRAVENOUS | Status: AC
Start: 2020-11-18 — End: 2020-11-18
  Administered 2020-11-18: 600 mg via INTRAVENOUS
  Filled 2020-11-18: qty 50

## 2020-11-18 MED ORDER — VANCOMYCIN HCL 1750 MG/350ML IV SOLN
1750.0000 mg | INTRAVENOUS | Status: DC
  Administered 2020-11-20: 1750 mg via INTRAVENOUS
  Filled 2020-11-18: qty 350

## 2020-11-18 MED ORDER — CIPROFLOXACIN IN D5W 400 MG/200ML IV SOLN
400.0000 mg | Freq: Once | INTRAVENOUS | Status: DC
Start: 2020-11-18 — End: 2020-11-19
  Administered 2020-11-18: 400 mg via INTRAVENOUS
  Filled 2020-11-18: qty 200

## 2020-11-18 MED ORDER — VANCOMYCIN HCL 2000 MG/400ML IV SOLN
2000.0000 mg | Freq: Once | INTRAVENOUS | Status: AC
Start: 2020-11-18 — End: 2020-11-19
  Administered 2020-11-18: 2000 mg via INTRAVENOUS
  Filled 2020-11-18: qty 400

## 2020-11-18 NOTE — H&P (Addendum)
[facility] Admission History and Physical Service Pager: [phone]  Patient name: Rodney [name] Medical record number: [id number] Date of birth: August 04, 1950 Age: 70 y.o. Gender: male  Primary Care Provider: [name], MD Consultants:  Code Status: FULL  Preferred Emergency Contact:  Contact Information     Name Relation Home Work Mobile   Lin[place] Significant other [phone]  [phone]        Chief Complaint: Right lower extremity edema, erythema and blisters   Assessment and Plan: Rodney [name] is a 70 y.o. male presenting with right lower extremity edema, erythema and blisters. PMH is significant for A. fib, CKD stage III, HFpEF, aortic stenosis, anemia of chronic disease, type 2 diabetes, neuropathy, hypertension, hyperlipidemia, CAD and PAD.  Right lower extremity cellulitis POD #4 right great toe amputation Rodney [name] is a 70 year old male presents today with edema, erythema and bullae of right lower extremity.  He was discharged yesterday from [place] for right great toe osteomyelitis and had right great toe amputation on 7/22.  He was followed by both vascular and orthopedic surgery during his admission.  Initally treated with cefepime, vancomycin and Flagyl. He was not discharged on antibiotics. Discharged on postop day 3. Labs on admission: Glucose 181, creatinine 1.48, Hb 9.8, neutrophils 8.4. CXR: Small right pleural effusion which is new.  Right foot x-ray: Diffuse bandage limiting assessment of soft tissues especially for subcutaneous emphysema, no fracture, dislocation or erosions. On examination: erythematous, edematous right LE with multiple bullae/ blisters with 3+ peripheral edema. Pt reports he has had several similar episodes of blistering infection before.  Pt denies fevers, no leukocytosis which is reassuring. Considered allergic reaction following surgery and dressing changes, however less likely given  patient's risk factors for poor wound healing and infection such as diabetes, PAD etc. Will admit for IV antibiotics of cellulitis. S/p clindamycin, ciprofloxacin and vancomycin in the ED.  -Admit to [facility], attending Dr. Owens Shark -Vitals per floor routine -Up with assistance -Carb modified diet -Vancomycin, clindamycin and cefepime per pharmacy -Continue warfarin per pharmacy (pt has missed dose yesterday) -Continue heparin per pharmacy for bridging (pt has missed dose yesterday) -For pain: tylenol 361m 6567mQ6PRN, oxycodone IR 77m41m8PRN -F/u blood cx (unfortunately drawn after abx given) -Lasix 51m51m now -Consider MRI LE if worsening sx  -Orthopedics consult -Wound care consult  -EKG -AM CBC, CMP, PT/INR -AM PT OT  Paroxysmal A. Fib Rate controlled.  Home meds include warfarin and amiodarone 200 mg. Pt was discharged on heparin for bridging postoperatively.  -INR goal 2-3 -Continue warfarin and heparin per pharmacy -Continue amiodarone  -monitor INR daily -F/u EKG  HFpEF  Moderate aortic stenosis Patient endorses dyspnea and wheezing in the last day which is his baseline. He reports improvement when sitting up. On exam he has 3+ peripheral edema which is more likely related to his cellulitis rather than fluid overload.  Last echo on 07/30/20: EF 50 to 55%. Moderate LVH, grade II diastolic dysfunction.  CXR in the ED: shows new right-sided pleural effusion.  Home meds include carvedilol 6.25 mg twice daily, Lasix 40 mg as needed -Continue carvedilol -Stricts Is and Os -Daily weights -Lasix 51mg19mnow   CKD stage III Cr 1.38 on admission, baseline 1.1-1.5 -Monitor with BMP -Avoid nephrotoxic agents  Tobacco use disorder COPD Sats 100% on 2 L in the ED.  Patient uses as needed oxygen at night previous admission patient was started on umeclidinium. Pt reports he stopped smoking 12  days ago. Smoked half a pack a day for over 50 years. He is requesting a nicotine  patch. -Continue umeclidinium -Duonebs q4H x 3 doses  -Nicotine patch 14 mg every 24 hours -Refer for formal PFTs as an outpatient for COPD diagnosis  Anemia of chronic disease Hemoglobin 9.8 on admission, baseline is 9-11. Likely recent blood loss contributing. Of note patient did receive blood transfusions on previous admission postoperatively. -Monitor CBC -Transfusion threshold 8  Type 2 diabetes with neuropathy Last A1c was 6.5 on 07/03/2020.  CBGs yesterday prior to discharge was 163-178. Last dose of  Novolog 70/30 was 2 days ago. Last meal was a sandwich a few hours ago in the ED.  Home meds include NovoLog 70/30 30 units a.m. and 15 units with supper, metformin 1000 mg daily, gabapentin 200 mg 3 times daily -CBGs with meals and nightly -Will give 15 units lantus now, titrate up to home dose as needed  -sSSI -Hold metformin -Continue gabapentin   Hypertension BPs on admission 135/69 Home meds include losartan.  This was held on discharge yesterday due to recent AKI -Hold losartan  Hyperlipidemia  CAD PAD Home meds include atorvastatin 80 mg daily, Plavix 75 mg daily -Continue atorvastatin 80 mg daily -Continue Plavix 75 mg daily  FEN/GI: Heart healthy diet, NPO MN Prophylaxis: Warfarin, heparin bridging   Disposition: MedSurg  History of Present Illness:  Rodney [name] is a 70 y.o. male presenting with erythema, edema, pain and blisters of the right LE.  Patient was recently discharged from hospital yesterday following great toe amputation.  At this time he was postop day 3.  He reports blisters, erythema and edema spreading up the shin. Pain is 8/10 severity. For the pain he has been taking tylenol and oxycodone which has been helping.  He denies any fevers. Reports similar reaction including blisters in April. Has not taken07/ any medications today. Took medications on Sunday 11/17/20.  Patient also reports increased shortness of breath over last 24 hours which is  th usual for him.  Denies chest pain, dizziness, palpitations.  In the ED patient was given cipro, vanc and clindamycin for cellulitis.   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   AKI (acute kidney injury) ([place]) 04/03/2020   CAD (coronary artery disease) 02/15/2020   CKD stage 3 due to type 2 diabetes mellitus ([place]) 02/15/2020   Long term (current) use of anticoagulants 12/29/2019   S/P BKA (below knee amputation) unilateral, left ([place]) 11/14/2019   High risk social situation 11/14/2019   AF (paroxysmal atrial fibrillation) ([place]) 10/03/2019   HFrEF (heart failure with reduced ejection fraction) ([place]) 09/28/2019   Moderate aortic stenosis 09/28/2019   Anemia in chronic illness 09/27/2019   PVD (peripheral vascular disease) ([place])    Umbilical hernia 17/51/0258   Obesity 10/09/2015   Tobacco abuse 09/07/2013   DM (diabetes mellitus), type 2 with neurological complications ([place]) 52/77/8242  Hypercholesteremia 10/28/2010   ERECTILE DYSFUNCTION 05/22/2009   Essential hypertension 01/23/2009    Past Medical History: Past Medical History:  Diagnosis Date   CHF (congestive heart failure) ([place])    Coronary artery disease    Diabetes mellitus without complication ([place])    HLD (hyperlipidemia)    Hypertension    Peripheral edema 02/24/2018   SOB (shortness of breath) 07/26/2018    Past Surgical History: Past Surgical History:  Procedure Laterality  Date   ABDOMINAL AORTOGRAM W/LOWER EXTREMITY N/A 08/05/2020   Procedure: ABDOMINAL AORTOGRAM W/LOWER EXTREMITY;  Surgeon: Marty Heck, MD;  Location: [facility];  Service: Cardiovascular;  Laterality: N/A;   ABDOMINAL AORTOGRAM W/LOWER EXTREMITY N/A 11/13/2020   Procedure: ABDOMINAL AORTOGRAM W/LOWER EXTREMITY;  Surgeon: Cherre Robins, MD;  Location: [facility];  Service: Cardiovascular;  Laterality: N/A;   AMPUTATION Left 09/28/2019   Procedure: AMPUTATION BELOW KNEE;  Surgeon: Newt Minion, MD;  Location: [facility];  Service: Orthopedics;  Laterality: Left;   AMPUTATION Right 11/15/2020   Procedure: RIGHT GREAT TOE AMPUTATION;  Surgeon: Newt Minion, MD;  Location: [facility];  Service: Orthopedics;  Laterality: Right;   CARDIAC CATHETERIZATION     CARDIOVERSION N/A 10/05/2019   Procedure: CARDIOVERSION;  Surgeon: Sanda Klein, MD;  Location: [facility];  Service: Cardiovascular;  Laterality: N/A;   FEMORAL-POPLITEAL BYPASS GRAFT Right 08/07/2020   Procedure: RIGHT FEMORAL TO BELOW KNEE POPLITEAL ARTERY BYPASS;  Surgeon: Waynetta Sandy, MD;  Location: [facility];  Service: Vascular;  Laterality: Right;   LEFT HEART CATH AND CORONARY ANGIOGRAPHY N/A 10/03/2019   Procedure: LEFT HEART CATH AND CORONARY ANGIOGRAPHY;  Surgeon: Lorretta Harp, MD;  Location: [facility];  Service: Cardiovascular;  Laterality: N/A;   PERIPHERAL VASCULAR INTERVENTION Right 08/05/2020   Procedure: PERIPHERAL VASCULAR INTERVENTION;  Surgeon: Marty Heck, MD;  Location: [facility];  Service: Cardiovascular;  Laterality: Right;  common Iliac   PERIPHERAL VASCULAR INTERVENTION Left 11/13/2020   Procedure: PERIPHERAL VASCULAR INTERVENTION;  Surgeon: Cherre Robins, MD;  Location: [facility];  Service: Cardiovascular;  Laterality: Left;   PERIPHERAL VASCULAR INTERVENTION Right 11/14/2020   Procedure: PERIPHERAL VASCULAR INTERVENTION;  Surgeon: Marty Heck,  MD;  Location: [facility];  Service: Cardiovascular;  Laterality: Right;  POP/SFA STENT   TEE WITHOUT CARDIOVERSION N/A 10/05/2019   Procedure: TRANSESOPHAGEAL ECHOCARDIOGRAM (TEE);  Surgeon: Sanda Klein, MD;  Location[facility];  Service: Cardiovascular;  Laterality: N/A;    Social History: Social History   Tobacco Use   Smoking status: Every Day    Packs/day: 0.50    Years: 50.00    Pack years: 25.00    Types: Cigarettes   Smokeless tobacco: Never  Vaping Use   Vaping Use: Never used  Substance Use Topics   Alcohol use: Yes    Alcohol/week: 6.0 standard drinks    Types: 6 Standard drinks or equivalent per week    Comment: 11/07/20 - states he has not drank in 6 months   Drug use: No   Additional social history: lives with girlfriend  Please also refer to relevant sections of EMR.  Family History: Family History  Problem Relation Age of Onset   Alcoholism Mother    Alcoholism Father    (If not completed, MUST add something in)  Allergies and Medications: Not on File No current facility-administered medications on file prior to encounter.   Current Outpatient Medications on File Prior  to Encounter  Medication Sig Dispense Refill   acetaminophen (TYLENOL) 325 MG tablet Take 1-2 tablets (325-650 mg total) by mouth every 6 (six) hours as needed for mild pain (pain score 1-3 or temp > 100.5). 60 tablet 0   amiodarone (PACERONE) 200 MG tablet Take 1 tablet (200 mg total) by mouth daily. 90 tablet 3   atorvastatin (LIPITOR) 80 MG tablet TAKE 1 TABLET EVERY DAY 90 tablet 3   Blood Glucose Monitoring Suppl (TRUE METRIX METER) DEVI Use to test blood sugar three times daily. 1 each 0   Blood Glucose Monitoring Suppl (TRUE METRIX METER) w/Device KIT USE AS DIRECTED 1 kit 0   carvedilol (COREG) 12.5 MG tablet Take 0.5 tablets (6.25 mg total) by mouth 2 (two) times daily with a meal. 180 tablet 3   clopidogrel (PLAVIX) 75 MG tablet Take 1 tablet (75 mg total) by mouth  daily with breakfast. 30 tablet 0   enoxaparin (LOVENOX) 100 MG/ML injection Inject 1 mL (100 mg total) into the skin every 12 (twelve) hours for 4 days. 7 mL 0   furosemide (LASIX) 40 MG tablet Take 1 tablet (40 mg total) by mouth daily as needed for fluid (swelling). 90 tablet 3   gabapentin (NEURONTIN) 100 MG capsule Take 2 capsules (200 mg total) by mouth 3 (three) times daily. 30 capsule 0   glucose blood (RELION TRUE METRIX TEST STRIPS) test strip Use to test blood sugar three times per day. 300 each 3   insulin NPH-regular Human (NOVOLIN 70/30 RELION) (70-30) 100 UNIT/ML injection Inject 15-30 Units into the skin See admin instructions. Inject 30 units into the skin with breakfast and 15 units with supper     metFORMIN (GLUCOPHAGE) 1000 MG tablet Take 1 tablet (1,000 mg total) by mouth daily. 90 tablet 3   Multiple Vitamin (MULTIVITAMIN WITH MINERALS) TABS tablet Take 1 tablet by mouth daily. 30 tablet 0   nicotine (NICODERM CQ - DOSED IN MG/24 HOURS) 14 mg/24hr patch Place 1 patch (14 mg total) onto the skin daily. 28 patch 0   nutrition supplement, JUVEN, (JUVEN) PACK Take 1 packet by mouth 2 (two) times daily between meals. 60 packet 0   oxyCODONE (OXY IR/ROXICODONE) 5 MG immediate release tablet Take 1 tablet (5 mg total) by mouth every 8 (eight) hours as needed for up to 3 days for moderate pain (pain score 4-6). 9 tablet 0   senna (SENOKOT) 8.6 MG TABS tablet Take 1 tablet (8.6 mg total) by mouth daily. 120 tablet 0   TRUEplus Lancets 33G MISC Use to test blood sugar three times per day. 300 each 3   umeclidinium bromide (INCRUSE ELLIPTA) 62.5 MCG/INH AEPB Inhale 1 puff into the lungs daily. 1 each 0   warfarin (COUMADIN) 7.5 MG tablet Take 1 tablet (7.5 mg total) by mouth one time only at 4 PM. 10 tablet 0   zinc sulfate 220 (50 Zn) MG capsule Take 1 capsule (220 mg total) by mouth daily. 30 capsule 0    Objective: BP 135/69   Pulse 83   Temp 98.9 F (37.2 C)   Resp 16   Ht 6'  (1.829 m)   Wt 110 kg   SpO2 100%   BMI 32.89 kg/m    Exam: General: appears older than staged age, mild respiratory distress, able to speak in full sentences, 2L [place]  Eyes: normal EOM, no scleral icterus  ENTM: dry MM  Neck: supple  Cardiovascular: S1 and S2 present, non tachycardic  Respiratory: diffuse wheeze bilaterally, increased WOB  Gastrointestinal: non distended, non tender, no guarding.  MSK: left BKA Derm: skin pathology as seen below, erythematous, edematous right LE with multiple bullae/ blisters, 3+ peripheral edema  Neuro: CN grossly intqact Psych: normal mood, normal affect         Labs and Imaging: CBC BMET  Recent Labs  Lab 11/18/20 1058  WBC 10.3  HGB 9.8*  HCT 31.8*  PLT 299   Recent Labs  Lab 11/18/20 1058  NA 135  K 4.9  CL 104  CO2 22  BUN 39*  CREATININE 1.48*  GLUCOSE 181*  CALCIUM 9.1     EKG: pending related questions 71 mg like  Lattie Haw, MD 11/18/2020, 11:26 PM PGY-3, [place] Intern pager: [phone], text pages welcome

## 2020-11-18 NOTE — ED Provider Notes (Signed)
Emergency Medicine Provider Triage Evaluation Note  Rodney [name] , a 70 y.o. male  was evaluated in triage.  Pt complains of right  leg pain and blister development.  Patient recently had surgery on his right foot.  Today he had increased pain and swelling and bleeding from the area.  He also has developed blisters on his lower leg.  No fevers.  He reports feeling weak.  He has had worsening shortness of breath over the past 24 hours due to stopping smoking.  Review of Systems  Positive: Foot pain, swelling Negative: fever  Physical Exam  BP (!) 141/78 (BP Location: Right Arm)   Pulse 92   Temp 99 F (37.2 C) (Oral)   Resp 18   Ht 6' (1.829 m)   Wt 110 kg   SpO2 91%   BMI 32.89 kg/m  Gen:   Awake, no distress   Resp:  Normal effort  MSK:   R bka. L foot wrapped with bleeding and serous drainage noted. R lower leg with large clear bullae   Medical Decision Making  Medically screening exam initiated at 10:56 AM.  Appropriate orders placed.  Rodney [name] was informed that the remainder of the evaluation will be completed by another provider, this initial triage assessment does not replace that evaluation, and the importance of remaining in the ED until their evaluation is complete.  Labs, xray, Rodney [name], Rodney [name] 11/18/20 1059    Rodney Bo, MD 11/19/20 1737

## 2020-11-18 NOTE — ED Provider Notes (Signed)
[facility] EMERGENCY DEPARTMENT Provider Note   CSN: [id number] Arrival date & time: 11/18/20  9741     History No chief complaint on file.   Rodney [name] is a 70 y.o. male.  Patient with past medical history notable for CHF, CAD, diabetes, hypertension, hyperlipidemia, peripheral edema, and recent right great toe amputation presents to the emergency department with a chief complaint of swelling, redness, and bleeding of the right foot.  He reports just having been released from the hospital after having his right great toe amputated.  He states that when he awoke this morning he had blisters of his leg along with redness spreading up the shin.  He denies any fevers.  Denies any treatment prior to arrival.  He reports associated generalized weakness.  He has also had increased shortness of breath over the past 24 hours.  The history is provided by the patient. No language interpreter was used.      Past Medical History:  Diagnosis Date   CHF (congestive heart failure) (HCC)    Coronary artery disease    Diabetes mellitus without complication ([place])    HLD (hyperlipidemia)    Hypertension    Peripheral edema 02/24/2018   SOB (shortness of breath) 07/26/2018    Patient Active Problem List   Diagnosis Date Noted   Peripheral neuropathy 11/11/2020   Open wound of right great toe    Supratherapeutic INR 11/10/2020   History of deep vein thrombosis (DVT) of lower extremity 11/10/2020   Osteomyelitis of right foot ([place]) 11/07/2020   Acute deep vein thrombosis (DVT) of iliac vein of right lower extremity (HCC)    Acute on chronic anemia    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   Gangrene of toe of right foot ([place]) 08/04/2020   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   AKI (acute kidney injury) ([place]) 04/03/2020   CAD (coronary artery disease) 02/15/2020   CKD stage 3 due to type 2 diabetes mellitus ([place]) 02/15/2020   Long term (current) use of anticoagulants  12/29/2019   S/P BKA (below knee amputation) unilateral, left ([place]) 11/14/2019   High risk social situation 11/14/2019   AF (paroxysmal atrial fibrillation) ([place]) 10/03/2019   HFrEF (heart failure with reduced ejection fraction) ([place]) 09/28/2019   Moderate aortic stenosis 09/28/2019   Anemia in chronic illness 09/27/2019   PVD (peripheral vascular disease) ([place])    Umbilical hernia 63/84/5364   Obesity 10/09/2015   Tobacco abuse 09/07/2013   DM (diabetes mellitus), type 2 with neurological complications ([place]) 68/06/2120   Hypercholesteremia 10/28/2010   ERECTILE DYSFUNCTION 05/22/2009   Essential hypertension 01/23/2009    Past Surgical History:  Procedure Laterality Date   ABDOMINAL AORTOGRAM W/LOWER EXTREMITY N/A 08/05/2020   Procedure: ABDOMINAL AORTOGRAM W/LOWER EXTREMITY;  Surgeon: Marty Heck, MD;  Location: [place] [facility];  Service: Cardiovascular;  Laterality: N/A;   ABDOMINAL AORTOGRAM W/LOWER EXTREMITY N/A 11/13/2020   Procedure: ABDOMINAL AORTOGRAM W/LOWER EXTREMITY;  Surgeon: Cherre Robins, MD;  Location: [place] [facility];  Service: Cardiovascular;  Laterality: N/A;   AMPUTATION Left 09/28/2019   Procedure: AMPUTATION BELOW KNEE;  Surgeon: Newt Minion, MD;  Location: [employer] [facility];  Service: Orthopedics;  Laterality: Left;   AMPUTATION Right 11/15/2020   Procedure: RIGHT GREAT TOE AMPUTATION;  Surgeon: Newt Minion, MD;  Location: [place];  Service: Orthopedics;  Laterality: Right;   CARDIAC CATHETERIZATION     CARDIOVERSION N/A 10/05/2019   Procedure: CARDIOVERSION;  Surgeon: Sanda Klein, MD;  Location: [place] [facility];  Service: Cardiovascular;  Laterality: N/A;   FEMORAL-POPLITEAL BYPASS GRAFT Right 08/07/2020   Procedure: RIGHT FEMORAL TO BELOW KNEE POPLITEAL ARTERY BYPASS;  Surgeon: Waynetta Sandy, MD;  Location: [place];  Service: Vascular;  Laterality: Right;   LEFT HEART CATH AND CORONARY ANGIOGRAPHY N/A 10/03/2019   Procedure: LEFT HEART CATH AND  CORONARY ANGIOGRAPHY;  Surgeon: Lorretta Harp, MD;  Location: [place] [facility];  Service: Cardiovascular;  Laterality: N/A;   PERIPHERAL VASCULAR INTERVENTION Right 08/05/2020   Procedure: PERIPHERAL VASCULAR INTERVENTION;  Surgeon: Marty Heck, MD;  Location: [place] [facility];  Service: Cardiovascular;  Laterality: Right;  common Iliac   PERIPHERAL VASCULAR INTERVENTION Left 11/13/2020   Procedure: PERIPHERAL VASCULAR INTERVENTION;  Surgeon: Cherre Robins, MD;  Location: [place] [facility];  Service: Cardiovascular;  Laterality: Left;   PERIPHERAL VASCULAR INTERVENTION Right 11/14/2020   Procedure: PERIPHERAL VASCULAR INTERVENTION;  Surgeon: Marty Heck, MD;  Location: [place] [facility];  Service: Cardiovascular;  Laterality: Right;  POP/SFA STENT   TEE WITHOUT CARDIOVERSION N/A 10/05/2019   Procedure: TRANSESOPHAGEAL ECHOCARDIOGRAM (TEE);  Surgeon: Sanda Klein, MD;  Location: [facility];  Service: Cardiovascular;  Laterality: N/A;       Family History  Problem Relation Age of Onset   Alcoholism Mother    Alcoholism Father     Social History   Tobacco Use   Smoking status: Every Day    Packs/day: 0.50    Years: 50.00    Pack years: 25.00    Types: Cigarettes   Smokeless tobacco: Never  Vaping Use   Vaping Use: Never used  Substance Use Topics   Alcohol use: Yes    Alcohol/week: 6.0 standard drinks    Types: 6 Standard drinks or equivalent per week    Comment: 11/07/20 - states he has not drank in 6 months   Drug use: No    Home Medications Prior to Admission medications   Medication Sig Start Date End Date Taking? Authorizing Provider  acetaminophen (TYLENOL) 325 MG tablet Take 1-2 tablets (325-650 mg total) by mouth every 6 (six) hours as needed for mild pain (pain score 1-3 or temp > 100.5). 11/17/20   Carollee Leitz, MD  amiodarone (PACERONE) 200 MG tablet Take 1 tablet (200 mg total) by mouth daily. 10/14/20   [name], MD   atorvastatin (LIPITOR) 80 MG tablet TAKE 1 TABLET EVERY DAY 03/25/20   O'Neal, Cassie Freer, MD  Blood Glucose Monitoring Suppl (TRUE METRIX METER) DEVI Use to test blood sugar three times daily. 11/20/19   [name], MD  Blood Glucose Monitoring Suppl (TRUE METRIX METER) w/Device KIT USE AS DIRECTED 03/25/20   [name], MD  carvedilol (COREG) 12.5 MG tablet Take 0.5 tablets (6.25 mg total) by mouth 2 (two) times daily with a meal. 10/14/20   Hensel, Jamal Collin, MD  clopidogrel (PLAVIX) 75 MG tablet Take 1 tablet (75 mg total) by mouth daily with breakfast. 11/18/20   Carollee Leitz, MD  enoxaparin (LOVENOX) 100 MG/ML injection Inject 1 mL (100 mg total) into the skin every 12 (twelve) hours for 4 days. 11/17/20 11/21/20  Maness, Arnette Norris, MD  furosemide (LASIX) 40 MG tablet Take 1 tablet (40 mg total) by mouth daily as needed for fluid (swelling). 10/14/20   [name], MD  gabapentin (NEURONTIN) 100 MG capsule Take 2 capsules (200 mg total) by mouth 3 (three) times daily. 11/17/20   Carollee Leitz, MD  glucose blood (RELION TRUE METRIX TEST STRIPS) test strip Use to test blood sugar three times per day. 11/20/19   [name], MD  insulin NPH-regular Human (NOVOLIN 70/30 RELION) (70-30) 100 UNIT/ML injection Inject 15-30 Units into the skin See admin instructions. Inject 30 units into the skin with breakfast and 15 units with supper    [provider]  metFORMIN (GLUCOPHAGE) 1000 MG tablet Take 1 tablet (1,000 mg total) by mouth daily. 09/18/20   [name], MD  Multiple Vitamin (MULTIVITAMIN WITH MINERALS) TABS tablet Take 1 tablet by mouth daily. 11/18/20   Carollee Leitz, MD  nicotine (NICODERM CQ - DOSED IN MG/24 HOURS) 14 mg/24hr patch Place 1 patch (14 mg total) onto the skin daily. 11/18/20   Carollee Leitz, MD  nutrition supplement, JUVEN, (JUVEN) PACK Take 1 packet by mouth 2 (two) times daily between meals. 11/17/20   Carollee Leitz, MD  oxyCODONE (OXY IR/ROXICODONE)  5 MG immediate release tablet Take 1 tablet (5 mg total) by mouth every 8 (eight) hours as needed for up to 3 days for moderate pain (pain score 4-6). 11/17/20 11/20/20  Carollee Leitz, MD  senna (SENOKOT) 8.6 MG TABS tablet Take 1 tablet (8.6 mg total) by mouth daily. 11/18/20   Carollee Leitz, MD  TRUEplus Lancets 33G MISC Use to test blood sugar three times per day. 11/20/19   [name], MD  umeclidinium bromide (INCRUSE ELLIPTA) 62.5 MCG/INH AEPB Inhale 1 puff into the lungs daily. 11/18/20   Carollee Leitz, MD  warfarin (COUMADIN) 7.5 MG tablet Take 1 tablet (7.5 mg total) by mouth one time only at 4 PM. 11/17/20   Carollee Leitz, MD  zinc sulfate 220 (50 Zn) MG capsule Take 1 capsule (220 mg total) by mouth daily. 11/18/20   Carollee Leitz, MD    Allergies    Patient has no allergy information on record.  Review of Systems   Review of Systems  All other systems reviewed and are negative.  Physical Exam Updated Vital Signs BP 136/69 (BP Location: Right Arm)   Pulse 80   Temp 98 F (36.7 C) (Oral)   Resp 20   Ht 6' (1.829 m)   Wt 110 kg   SpO2 96%   BMI 32.89 kg/m   Physical Exam Vitals and nursing note reviewed.  Constitutional:      Appearance: He is well-developed.  HENT:     Head: Normocephalic and atraumatic.  Eyes:     Conjunctiva/sclera: Conjunctivae normal.  Cardiovascular:     Rate and Rhythm: Normal rate and regular rhythm.     Heart sounds: No murmur heard. Pulmonary:     Effort: Pulmonary effort is normal. No respiratory distress.     Breath sounds: Normal breath sounds.  Abdominal:     Palpations: Abdomen is soft.     Tenderness: There is no abdominal tenderness.  Musculoskeletal:     Cervical back: Neck supple.  Skin:    General: Skin is warm and dry.     Comments: Erythema extending up the shin, large bullae as pictured, skin is hot  Neurological:     Mental Status: He is alert and oriented to person, place, and time.  Psychiatric:        Mood and  Affect: Mood normal.        Behavior: Behavior normal.         ED Results / Procedures / Treatments   Labs (all labs ordered are listed, but only abnormal  results are displayed) Labs Reviewed  CBC WITH DIFFERENTIAL/PLATELET - Abnormal; Notable for the following components:      Result Value   RBC 3.35 (*)    Hemoglobin 9.8 (*)    HCT 31.8 (*)    Neutro Abs 8.4 (*)    All other components within normal limits  COMPREHENSIVE METABOLIC PANEL - Abnormal; Notable for the following components:   Glucose, Bld 181 (*)    BUN 39 (*)    Creatinine, Ser 1.48 (*)    Albumin 2.8 (*)    GFR, Estimated 51 (*)    All other components within normal limits  LACTIC ACID, PLASMA  LACTIC ACID, PLASMA  URINALYSIS, ROUTINE W REFLEX MICROSCOPIC  CBG MONITORING, ED    EKG None  Radiology DG Chest 2 View  Result Date: 11/18/2020 CLINICAL DATA:  Shortness of breath and productive cough since 11/14/2020. EXAM: CHEST - 2 VIEW COMPARISON:  Single-view of the chest 11/17/2020. FINDINGS: The patient has small bilateral pleural effusions. The patient's left effusion has decreased since the prior examination. The right effusion is new. There is cardiomegaly and mild interstitial edema. No consolidative process is identified. Aortic atherosclerosis is noted. IMPRESSION: Small right pleural effusion is new since the prior examination. Small left pleural effusion has decreased since the prior study. Cardiomegaly and interstitial edema appear unchanged. Aortic Atherosclerosis (ICD10-I70.0). Electronically Signed   By: Inge Rise M.D.   On: 11/18/2020 11:43   DG CHEST PORT 1 VIEW  Result Date: 11/17/2020 CLINICAL DATA:  Dyspnea, admitted for osteomyelitis EXAM: PORTABLE CHEST 1 VIEW COMPARISON:  08/14/2020 chest radiograph. FINDINGS: Stable cardiomediastinal silhouette with mild cardiomegaly. No pneumothorax. Asymmetric smooth left pleural thickening throughout the peripheral left pleural space, mildly  increased from prior, with adjacent curvilinear peripheral left lung opacities and mild volume loss in the left hemithorax. No right pleural effusion. Mild diffuse prominence of the parahilar interstitial markings. IMPRESSION: Mild cardiomegaly with mild diffuse prominence of the parahilar interstitial markings, suggesting mild cardiogenic pulmonary edema. Asymmetric mild volume loss in the left hemithorax. Asymmetric smooth peripheral left pleural thickening with adjacent curvilinear lung opacities, mildly increased from prior, favoring chronic pleural-parenchymal scarring, with loculated small left pleural effusion not excluded. Electronically Signed   By: Ilona Sorrel M.D.   On: 11/17/2020 09:36   DG Foot Complete Right  Result Date: 11/18/2020 CLINICAL DATA:  Toe amputation last Thursday with foot blisters. EXAM: RIGHT FOOT COMPLETE - 3+ VIEW COMPARISON:  11/07/2020 FINDINGS: Diffuse bandage limiting assessment of soft tissues. There has been interval great toe amputation with unremarkable first metatarsal head. No fracture or dislocation. No acute erosion is seen. There is diffuse osteopenia and arterial calcification. IMPRESSION: 1. Limited by overlapping bandage, especially for detecting soft tissue emphysema. 2. No bony fracture or erosion. Electronically Signed   By: Monte Fantasia M.D.   On: 11/18/2020 11:41    Procedures Procedures   Medications Ordered in ED Medications  ciprofloxacin (CIPRO) IVPB 400 mg (has no administration in time range)  clindamycin (CLEOCIN) IVPB 600 mg (has no administration in time range)  vancomycin (VANCOCIN) IVPB 1000 mg/200 mL premix (has no administration in time range)    ED Course  I have reviewed the triage vital signs and the nursing notes.  Pertinent labs & imaging results that were available during my care of the patient were reviewed by me and considered in my medical decision making (see chart for details).    MDM Rules/Calculators/A&P  Patient here after recent right great toe amputation.  Just released from the hospital.  States that he woke up with increased swelling, blisters and redness to his leg this morning.    Will start IV antibiotic therapy.  Will need admission.  [facility] team for admitting.  Final Clinical Impression(s) / ED Diagnoses Final diagnoses:  Cellulitis of right lower extremity  Bullae    Rx / DC Orders ED Discharge Orders     None        Montine Circle, PA-C 11/18/20 2350    Horton, Alvin Critchley, DO 11/19/20 1728

## 2020-11-18 NOTE — Progress Notes (Signed)
Pharmacy Antibiotic Note  Rodney [name] is a 70 y.o. male admitted on 11/18/2020 with cellulitis.  Pharmacy has been consulted for vancomycin dosing.  Plan: Vancomycin 2000 mg once then Vancomycin 1750 IV every 24 hours.  Goal trough 15-20 mcg/mL. The dose of vancomycin will be adjusted by pharmacy based on renal function. Follow-up cultures and de-escalate if/when appropriate  Height: 6' (182.9 cm) Weight: 110 kg (242 lb 8.1 oz) IBW/kg (Calculated) : 77.6  Temp (24hrs), Avg:98.6 F (37 C), Min:98 F (36.7 C), Max:99 F (37.2 C)  Recent Labs  Lab 11/14/20 0327 11/15/20 0615 11/16/20 0440 11/17/20 0115 11/18/20 1058  WBC 6.5 8.9 7.9 7.9 10.3  CREATININE 1.27* 1.15 1.48* 1.53* 1.48*  LATICACIDVEN  --   --   --   --  0.9    Estimated Creatinine Clearance: 61.2 mL/min (A) (by C-G formula based on SCr of 1.48 mg/dL (H)).    Not on File  Antimicrobials this admission: ciprofloxacin 7/25 >>  vancomycin 7/25 >>   Microbiology results: pending  Thank you for allowing pharmacy to be a part of this patient's care.  Heloise Purpura 11/18/2020 10:41 PM

## 2020-11-18 NOTE — ED Triage Notes (Signed)
Pt reports he was just discharged from hospital Sunday afternoon after right great toe amputation. States he woke up with blisters to his right leg today. Right foot is wrapped and some bleeding seen.

## 2020-11-19 ENCOUNTER — Encounter (HOSPITAL_COMMUNITY): Payer: Self-pay | Admitting: Family Medicine

## 2020-11-19 ENCOUNTER — Inpatient Hospital Stay (HOSPITAL_COMMUNITY): Payer: Medicare PPO

## 2020-11-19 DIAGNOSIS — I77810 Thoracic aortic ectasia: Secondary | ICD-10-CM | POA: Diagnosis present

## 2020-11-19 DIAGNOSIS — Z86718 Personal history of other venous thrombosis and embolism: Secondary | ICD-10-CM | POA: Diagnosis not present

## 2020-11-19 DIAGNOSIS — M7989 Other specified soft tissue disorders: Secondary | ICD-10-CM

## 2020-11-19 DIAGNOSIS — I2510 Atherosclerotic heart disease of native coronary artery without angina pectoris: Secondary | ICD-10-CM | POA: Diagnosis present

## 2020-11-19 DIAGNOSIS — E669 Obesity, unspecified: Secondary | ICD-10-CM | POA: Diagnosis present

## 2020-11-19 DIAGNOSIS — E1122 Type 2 diabetes mellitus with diabetic chronic kidney disease: Secondary | ICD-10-CM | POA: Diagnosis present

## 2020-11-19 DIAGNOSIS — I872 Venous insufficiency (chronic) (peripheral): Secondary | ICD-10-CM | POA: Diagnosis present

## 2020-11-19 DIAGNOSIS — D638 Anemia in other chronic diseases classified elsewhere: Secondary | ICD-10-CM | POA: Diagnosis present

## 2020-11-19 DIAGNOSIS — F17210 Nicotine dependence, cigarettes, uncomplicated: Secondary | ICD-10-CM | POA: Diagnosis present

## 2020-11-19 DIAGNOSIS — I5043 Acute on chronic combined systolic (congestive) and diastolic (congestive) heart failure: Secondary | ICD-10-CM | POA: Diagnosis present

## 2020-11-19 DIAGNOSIS — I5041 Acute combined systolic (congestive) and diastolic (congestive) heart failure: Secondary | ICD-10-CM | POA: Insufficient documentation

## 2020-11-19 DIAGNOSIS — N179 Acute kidney failure, unspecified: Secondary | ICD-10-CM

## 2020-11-19 DIAGNOSIS — L03115 Cellulitis of right lower limb: Secondary | ICD-10-CM | POA: Diagnosis present

## 2020-11-19 DIAGNOSIS — E1149 Type 2 diabetes mellitus with other diabetic neurological complication: Secondary | ICD-10-CM

## 2020-11-19 DIAGNOSIS — I350 Nonrheumatic aortic (valve) stenosis: Secondary | ICD-10-CM | POA: Diagnosis present

## 2020-11-19 DIAGNOSIS — D649 Anemia, unspecified: Secondary | ICD-10-CM | POA: Diagnosis not present

## 2020-11-19 DIAGNOSIS — I130 Hypertensive heart and chronic kidney disease with heart failure and stage 1 through stage 4 chronic kidney disease, or unspecified chronic kidney disease: Secondary | ICD-10-CM | POA: Diagnosis present

## 2020-11-19 DIAGNOSIS — N183 Chronic kidney disease, stage 3 unspecified: Secondary | ICD-10-CM

## 2020-11-19 DIAGNOSIS — E1169 Type 2 diabetes mellitus with other specified complication: Secondary | ICD-10-CM | POA: Diagnosis present

## 2020-11-19 DIAGNOSIS — I480 Paroxysmal atrial fibrillation: Secondary | ICD-10-CM | POA: Diagnosis present

## 2020-11-19 DIAGNOSIS — I428 Other cardiomyopathies: Secondary | ICD-10-CM | POA: Diagnosis present

## 2020-11-19 DIAGNOSIS — M869 Osteomyelitis, unspecified: Secondary | ICD-10-CM | POA: Diagnosis present

## 2020-11-19 DIAGNOSIS — E872 Acidosis: Secondary | ICD-10-CM | POA: Diagnosis present

## 2020-11-19 DIAGNOSIS — R0609 Other forms of dyspnea: Secondary | ICD-10-CM | POA: Diagnosis not present

## 2020-11-19 DIAGNOSIS — R778 Other specified abnormalities of plasma proteins: Secondary | ICD-10-CM | POA: Diagnosis not present

## 2020-11-19 DIAGNOSIS — R238 Other skin changes: Secondary | ICD-10-CM | POA: Insufficient documentation

## 2020-11-19 DIAGNOSIS — I2582 Chronic total occlusion of coronary artery: Secondary | ICD-10-CM | POA: Diagnosis present

## 2020-11-19 DIAGNOSIS — L0390 Cellulitis, unspecified: Secondary | ICD-10-CM | POA: Diagnosis present

## 2020-11-19 DIAGNOSIS — E7800 Pure hypercholesterolemia, unspecified: Secondary | ICD-10-CM | POA: Diagnosis present

## 2020-11-19 DIAGNOSIS — I248 Other forms of acute ischemic heart disease: Secondary | ICD-10-CM | POA: Diagnosis present

## 2020-11-19 DIAGNOSIS — J449 Chronic obstructive pulmonary disease, unspecified: Secondary | ICD-10-CM | POA: Diagnosis present

## 2020-11-19 DIAGNOSIS — Z20822 Contact with and (suspected) exposure to covid-19: Secondary | ICD-10-CM | POA: Diagnosis present

## 2020-11-19 DIAGNOSIS — E1151 Type 2 diabetes mellitus with diabetic peripheral angiopathy without gangrene: Secondary | ICD-10-CM | POA: Diagnosis present

## 2020-11-19 DIAGNOSIS — E440 Moderate protein-calorie malnutrition: Secondary | ICD-10-CM

## 2020-11-19 DIAGNOSIS — I739 Peripheral vascular disease, unspecified: Secondary | ICD-10-CM

## 2020-11-19 LAB — COMPREHENSIVE METABOLIC PANEL
ALT: 12 U/L (ref 0–44)
AST: 20 U/L (ref 15–41)
Albumin: 2.4 g/dL — ABNORMAL LOW (ref 3.5–5.0)
Alkaline Phosphatase: 48 U/L (ref 38–126)
Anion gap: 8 (ref 5–15)
BUN: 42 mg/dL — ABNORMAL HIGH (ref 8–23)
CO2: 21 mmol/L — ABNORMAL LOW (ref 22–32)
Calcium: 8.8 mg/dL — ABNORMAL LOW (ref 8.9–10.3)
Chloride: 108 mmol/L (ref 98–111)
Creatinine, Ser: 1.47 mg/dL — ABNORMAL HIGH (ref 0.61–1.24)
GFR, Estimated: 52 mL/min — ABNORMAL LOW (ref >60)
Glucose, Bld: 155 mg/dL — ABNORMAL HIGH (ref 70–99)
Potassium: 4.8 mmol/L (ref 3.5–5.1)
Sodium: 137 mmol/L (ref 135–145)
Total Bilirubin: 0.5 mg/dL (ref 0.3–1.2)
Total Protein: 6 g/dL — ABNORMAL LOW (ref 6.5–8.1)

## 2020-11-19 LAB — GLUCOSE, CAPILLARY: Glucose-Capillary: 126 mg/dL — ABNORMAL HIGH (ref 70–99)

## 2020-11-19 LAB — CBG MONITORING, ED
Glucose-Capillary: 140 mg/dL — ABNORMAL HIGH (ref 70–99)
Glucose-Capillary: 145 mg/dL — ABNORMAL HIGH (ref 70–99)
Glucose-Capillary: 180 mg/dL — ABNORMAL HIGH (ref 70–99)
Glucose-Capillary: 190 mg/dL — ABNORMAL HIGH (ref 70–99)
Glucose-Capillary: 211 mg/dL — ABNORMAL HIGH (ref 70–99)

## 2020-11-19 LAB — HEPARIN LEVEL (UNFRACTIONATED)
Heparin Unfractionated: 0.12 IU/mL — ABNORMAL LOW (ref 0.30–0.70)
Heparin Unfractionated: 0.26 IU/mL — ABNORMAL LOW (ref 0.30–0.70)
Heparin Unfractionated: 0.47 IU/mL (ref 0.30–0.70)

## 2020-11-19 LAB — ECHOCARDIOGRAM COMPLETE
AR max vel: 0.81 cm2
AV Area VTI: 0.73 cm2
AV Area mean vel: 0.73 cm2
AV Mean grad: 35 mmHg
AV Peak grad: 54.5 mmHg
Ao pk vel: 3.69 m/s
Area-P 1/2: 4.6 cm2
Calc EF: 59.4 %
Height: 72 in
MV M vel: 5.3 m/s
MV Peak grad: 112.4 mmHg
S' Lateral: 4.3 cm
Single Plane A2C EF: 59.1 %
Single Plane A4C EF: 60.8 %
Weight: 3880.1 oz

## 2020-11-19 LAB — CBC
HCT: 31.4 % — ABNORMAL LOW (ref 39.0–52.0)
Hemoglobin: 9.5 g/dL — ABNORMAL LOW (ref 13.0–17.0)
MCH: 29.8 pg (ref 26.0–34.0)
MCHC: 30.3 g/dL (ref 30.0–36.0)
MCV: 98.4 fL (ref 80.0–100.0)
Platelets: 287 10*3/uL (ref 150–400)
RBC: 3.19 MIL/uL — ABNORMAL LOW (ref 4.22–5.81)
RDW: 14.3 % (ref 11.5–15.5)
WBC: 8.7 10*3/uL (ref 4.0–10.5)
nRBC: 0 % (ref 0.0–0.2)

## 2020-11-19 LAB — RESP PANEL BY RT-PCR (FLU A&B, COVID) ARPGX2
Influenza A by PCR: NEGATIVE
Influenza B by PCR: NEGATIVE
SARS Coronavirus 2 by RT PCR: NEGATIVE

## 2020-11-19 LAB — TROPONIN I (HIGH SENSITIVITY)
Troponin I (High Sensitivity): 1462 ng/L (ref <18)
Troponin I (High Sensitivity): 1154 ng/L (ref <18)

## 2020-11-19 LAB — PROTIME-INR
INR: 1.3 — ABNORMAL HIGH (ref 0.8–1.2)
Prothrombin Time: 16.3 seconds — ABNORMAL HIGH (ref 11.4–15.2)

## 2020-11-19 MED ORDER — INSULIN GLARGINE 100 UNIT/ML ~~LOC~~ SOLN
15.0000 [IU] | Freq: Once | SUBCUTANEOUS | Status: AC
  Administered 2020-11-19: 15 [IU] via SUBCUTANEOUS
  Filled 2020-11-19: qty 0.15

## 2020-11-19 MED ORDER — NEPRO/CARBSTEADY PO LIQD
237.0000 mL | Freq: Two times a day (BID) | ORAL | Status: DC
  Administered 2020-11-20 – 2020-11-21 (×3): 237 mL via ORAL
  Filled 2020-11-19: qty 237

## 2020-11-19 MED ORDER — WARFARIN SODIUM 10 MG PO TABS
10.0000 mg | ORAL_TABLET | Freq: Once | ORAL | Status: AC
  Administered 2020-11-19: 10 mg via ORAL
  Filled 2020-11-19: qty 1

## 2020-11-19 MED ORDER — RENA-VITE PO TABS
1.0000 | ORAL_TABLET | Freq: Every day | ORAL | Status: DC
  Administered 2020-11-19 – 2020-11-20 (×2): 1 via ORAL
  Filled 2020-11-19 (×3): qty 1

## 2020-11-19 MED ORDER — NICOTINE 14 MG/24HR TD PT24
14.0000 mg | MEDICATED_PATCH | Freq: Every day | TRANSDERMAL | Status: DC
  Administered 2020-11-19 – 2020-11-21 (×3): 14 mg via TRANSDERMAL
  Filled 2020-11-19 (×3): qty 1

## 2020-11-19 MED ORDER — CARVEDILOL 6.25 MG PO TABS
6.2500 mg | ORAL_TABLET | Freq: Two times a day (BID) | ORAL | Status: DC
  Administered 2020-11-19 (×2): 6.25 mg via ORAL
  Filled 2020-11-19: qty 2
  Filled 2020-11-19: qty 1
  Filled 2020-11-19: qty 2

## 2020-11-19 MED ORDER — FUROSEMIDE 20 MG PO TABS
40.0000 mg | ORAL_TABLET | Freq: Once | ORAL | Status: AC
  Administered 2020-11-19: 40 mg via ORAL
  Filled 2020-11-19: qty 2

## 2020-11-19 MED ORDER — FUROSEMIDE 10 MG/ML IJ SOLN
40.0000 mg | Freq: Every day | INTRAMUSCULAR | Status: DC
  Administered 2020-11-19: 40 mg via INTRAVENOUS
  Filled 2020-11-19 (×2): qty 4

## 2020-11-19 MED ORDER — CLOPIDOGREL BISULFATE 75 MG PO TABS
75.0000 mg | ORAL_TABLET | Freq: Every day | ORAL | Status: DC
  Administered 2020-11-19 – 2020-11-21 (×3): 75 mg via ORAL
  Filled 2020-11-19 (×3): qty 1

## 2020-11-19 MED ORDER — PERFLUTREN LIPID MICROSPHERE
1.0000 mL | INTRAVENOUS | Status: AC | PRN
  Administered 2020-11-19: 2 mL via INTRAVENOUS
  Filled 2020-11-19: qty 10

## 2020-11-19 MED ORDER — METRONIDAZOLE IVPB CUSTOM: 825.0000 mg | Freq: Four times a day (QID) | INTRAVENOUS | Status: DC

## 2020-11-19 MED ORDER — OXYCODONE HCL 5 MG PO TABS
5.0000 mg | ORAL_TABLET | Freq: Three times a day (TID) | ORAL | Status: DC | PRN
  Administered 2020-11-19 – 2020-11-20 (×2): 5 mg via ORAL
  Filled 2020-11-19 (×3): qty 1

## 2020-11-19 MED ORDER — IPRATROPIUM-ALBUTEROL 0.5-2.5 (3) MG/3ML IN SOLN
3.0000 mL | RESPIRATORY_TRACT | Status: DC | PRN
  Administered 2020-11-19 – 2020-11-21 (×3): 3 mL via RESPIRATORY_TRACT
  Filled 2020-11-19 (×3): qty 3

## 2020-11-19 MED ORDER — AMIODARONE HCL 200 MG PO TABS
200.0000 mg | ORAL_TABLET | Freq: Every day | ORAL | Status: DC
  Administered 2020-11-19 – 2020-11-21 (×3): 200 mg via ORAL
  Filled 2020-11-19 (×3): qty 1

## 2020-11-19 MED ORDER — ACETAMINOPHEN 325 MG PO TABS
325.0000 mg | ORAL_TABLET | Freq: Four times a day (QID) | ORAL | Status: DC | PRN
  Administered 2020-11-19: 650 mg via ORAL
  Filled 2020-11-19: qty 2

## 2020-11-19 MED ORDER — UMECLIDINIUM BROMIDE 62.5 MCG/INH IN AEPB
1.0000 | INHALATION_SPRAY | Freq: Every day | RESPIRATORY_TRACT | Status: DC
  Administered 2020-11-20 – 2020-11-21 (×2): 1 via RESPIRATORY_TRACT
  Filled 2020-11-19: qty 7

## 2020-11-19 MED ORDER — HEPARIN (PORCINE) 25000 UT/250ML-% IV SOLN
2350.0000 [IU]/h | INTRAVENOUS | Status: DC
  Administered 2020-11-19 (×2): 2150 [IU]/h via INTRAVENOUS
  Administered 2020-11-20 – 2020-11-21 (×4): 2350 [IU]/h via INTRAVENOUS
  Filled 2020-11-19 (×8): qty 250

## 2020-11-19 MED ORDER — WARFARIN - PHARMACIST DOSING INPATIENT: Freq: Every day | Status: DC

## 2020-11-19 MED ORDER — SODIUM CHLORIDE 0.9 % IV SOLN
2.0000 g | Freq: Three times a day (TID) | INTRAVENOUS | Status: DC
  Administered 2020-11-19 – 2020-11-20 (×4): 2 g via INTRAVENOUS
  Filled 2020-11-19 (×6): qty 2

## 2020-11-19 MED ORDER — CLINDAMYCIN PHOSPHATE 600 MG/50ML IV SOLN
600.0000 mg | Freq: Three times a day (TID) | INTRAVENOUS | Status: DC
  Administered 2020-11-19: 600 mg via INTRAVENOUS
  Filled 2020-11-19: qty 50

## 2020-11-19 MED ORDER — INSULIN ASPART 100 UNIT/ML IJ SOLN
0.0000 [IU] | Freq: Three times a day (TID) | INTRAMUSCULAR | Status: DC
  Administered 2020-11-19: 2 [IU] via SUBCUTANEOUS
  Administered 2020-11-19 – 2020-11-21 (×5): 3 [IU] via SUBCUTANEOUS
  Administered 2020-11-21: 2 [IU] via SUBCUTANEOUS

## 2020-11-19 MED ORDER — METRONIDAZOLE 500 MG/100ML IV SOLN
500.0000 mg | Freq: Two times a day (BID) | INTRAVENOUS | Status: DC
  Administered 2020-11-19 – 2020-11-20 (×3): 500 mg via INTRAVENOUS
  Filled 2020-11-19 (×3): qty 100

## 2020-11-19 MED ORDER — ATORVASTATIN CALCIUM 80 MG PO TABS
80.0000 mg | ORAL_TABLET | Freq: Every day | ORAL | Status: DC
  Administered 2020-11-19 – 2020-11-21 (×3): 80 mg via ORAL
  Filled 2020-11-19 (×2): qty 1
  Filled 2020-11-19: qty 2

## 2020-11-19 MED ORDER — GABAPENTIN 100 MG PO CAPS
200.0000 mg | ORAL_CAPSULE | Freq: Three times a day (TID) | ORAL | Status: DC
  Administered 2020-11-19 – 2020-11-21 (×8): 200 mg via ORAL
  Filled 2020-11-19 (×8): qty 2

## 2020-11-19 MED ORDER — IPRATROPIUM-ALBUTEROL 0.5-2.5 (3) MG/3ML IN SOLN
3.0000 mL | RESPIRATORY_TRACT | Status: AC
  Administered 2020-11-19 (×2): 3 mL via RESPIRATORY_TRACT
  Filled 2020-11-19 (×3): qty 3

## 2020-11-19 NOTE — ED Notes (Signed)
Breakfast Order Placed ?

## 2020-11-19 NOTE — Progress Notes (Signed)
Orthopedic Tech Progress Note Patient Details:  Rodney [name] 1950-06-19 [id number]  Called in order to [employer] for a GRADUATED COMPRESSION STOCKING   Patient ID: MACAULAY REICHER, male   DOB: [birth year], 70 y.o.   MRN: [id number]  Janit Pagan 11/19/2020, 10:20 AM

## 2020-11-19 NOTE — Plan of Care (Signed)
  Problem: Clinical Measurements: Goal: Ability to maintain clinical measurements within normal limits will improve Outcome: Progressing   Problem: Clinical Measurements: Goal: Diagnostic test results will improve Outcome: Progressing   Problem: Clinical Measurements: Goal: Respiratory complications will improve Outcome: Progressing   Problem: Health Behavior/Discharge Planning: Goal: Ability to manage health-related needs will improve Outcome: Progressing   Problem: Pain Managment: Goal: General experience of comfort will improve Outcome: Progressing   Problem: Safety: Goal: Ability to remain free from injury will improve Outcome: Progressing   Problem: Skin Integrity: Goal: Risk for impaired skin integrity will decrease Outcome: Progressing

## 2020-11-19 NOTE — Progress Notes (Signed)
Pharmacy Note: Will start cefepime 2gm IV q8 hours.  F/u renal function, cultures and clinical course. Excell Seltzer, PharmD

## 2020-11-19 NOTE — ED Notes (Addendum)
Inpatient team paged to make aware of elevated trop

## 2020-11-19 NOTE — Consult Note (Signed)
ORTHOPAEDIC CONSULTATION  REQUESTING PHYSICIAN: Leeanne Rio, MD  Chief Complaint: Blisters right leg.  HPI: Rodney [name] is a 70 y.o. male who presents with massive venous insufficiency blisters right lower extremity status post revascularization and status post great toe amputation.  Patient states that he has kept his leg dependent and has not elevated his leg.  Past Medical History:  Diagnosis Date   Aortic stenosis    moderate in 2022   Atrial fibrillation (HCC)    CHF (congestive heart failure) ([place])    Coronary artery disease    Diabetes mellitus without complication (HCC)    HLD (hyperlipidemia)    Hypertension    Peripheral arterial disease ([place])    Past Surgical History:  Procedure Laterality Date   ABDOMINAL AORTOGRAM W/LOWER EXTREMITY N/A 08/05/2020   Procedure: ABDOMINAL AORTOGRAM W/LOWER EXTREMITY;  Surgeon: Marty Heck, MD;  Location: [facility];  Service: Cardiovascular;  Laterality: N/A;   ABDOMINAL AORTOGRAM W/LOWER EXTREMITY N/A 11/13/2020   Procedure: ABDOMINAL AORTOGRAM W/LOWER EXTREMITY;  Surgeon: Cherre Robins, MD;  Location: [facility];  Service: Cardiovascular;  Laterality: N/A;   AMPUTATION Left 09/28/2019   Procedure: AMPUTATION BELOW KNEE;  Surgeon: Newt Minion, MD;  Location: [facility];  Service: Orthopedics;  Laterality: Left;   AMPUTATION Right 11/15/2020   Procedure: RIGHT GREAT TOE AMPUTATION;  Surgeon: Newt Minion, MD;  Location: [facility];  Service: Orthopedics;  Laterality: Right;   CARDIAC CATHETERIZATION     CARDIOVERSION N/A 10/05/2019   Procedure: CARDIOVERSION;  Surgeon: Sanda Klein, MD;  Location: [facility];  Service: Cardiovascular;  Laterality: N/A;   FEMORAL-POPLITEAL BYPASS GRAFT Right 08/07/2020   Procedure: RIGHT FEMORAL TO BELOW KNEE POPLITEAL ARTERY BYPASS;  Surgeon: Waynetta Sandy, MD;  Location: [facility];  Service: Vascular;  Laterality: Right;   LEFT HEART CATH AND CORONARY  ANGIOGRAPHY N/A 10/03/2019   Procedure: LEFT HEART CATH AND CORONARY ANGIOGRAPHY;  Surgeon: Lorretta Harp, MD;  Location: [facility];  Service: Cardiovascular;  Laterality: N/A;   PERIPHERAL VASCULAR INTERVENTION Right 08/05/2020   Procedure: PERIPHERAL VASCULAR INTERVENTION;  Surgeon: Marty Heck, MD;  Location: [facility];  Service: Cardiovascular;  Laterality: Right;  common Iliac   PERIPHERAL VASCULAR INTERVENTION Left 11/13/2020   Procedure: PERIPHERAL VASCULAR INTERVENTION;  Surgeon: Cherre Robins, MD;  Location: [facility];  Service: Cardiovascular;  Laterality: Left;   PERIPHERAL VASCULAR INTERVENTION Right 11/14/2020   Procedure: PERIPHERAL VASCULAR INTERVENTION;  Surgeon: Marty Heck, MD;  Location: [facility];  Service: Cardiovascular;  Laterality: Right;  POP/SFA STENT   TEE WITHOUT CARDIOVERSION N/A 10/05/2019   Procedure: TRANSESOPHAGEAL ECHOCARDIOGRAM (TEE);  Surgeon: Sanda Klein, MD;  Location: [facility];  Service: Cardiovascular;  Laterality: N/A;   Social History   Socioeconomic History   Marital status: Widowed    Spouse name: Not on file   Number of children: Not on file   Years of education: Not on file   Highest education level: Not on file  Occupational History   Not on file  Tobacco Use   Smoking status: Every Day    Packs/day: 0.50    Years: 50.00    Pack years: 25.00    Types: Cigarettes   Smokeless tobacco: Never  Vaping Use   Vaping Use: Never used  Substance and Sexual Activity   Alcohol use: Yes    Alcohol/week: 6.0 standard drinks    Types:  6 Standard drinks or equivalent per week    Comment: 11/07/20 - states he has not drank in 6 months   Drug use: No   Sexual activity: Yes    Partners: Female    Comment: monagamous stable relationship  Other Topics Concern   Not on file  Social History Narrative   Not on file   Social Determinants of Health   Financial Resource Strain: Not on file   Food Insecurity: Not on file  Transportation Needs: Not on file  Physical Activity: Not on file  Stress: Not on file  Social Connections: Not on file   Family History  Problem Relation Age of Onset   Alcoholism Mother    Alcoholism Father    - negative except otherwise stated in the family history section No Known Allergies Prior to Admission medications   Medication Sig Start Date End Date Taking? Authorizing Provider  acetaminophen (TYLENOL) 325 MG tablet Take 1-2 tablets (325-650 mg total) by mouth every 6 (six) hours as needed for mild pain (pain score 1-3 or temp > 100.5). 11/17/20  Yes Carollee Leitz, MD  amiodarone (PACERONE) 200 MG tablet Take 1 tablet (200 mg total) by mouth daily. 10/14/20  Yes Hensel, Jamal Collin, MD  atorvastatin (LIPITOR) 80 MG tablet TAKE 1 TABLET EVERY DAY Patient taking differently: Take 80 mg by mouth daily. 03/25/20  Yes O'Neal, Cassie Freer, MD  Blood Glucose Monitoring Suppl (TRUE METRIX METER) DEVI Use to test blood sugar three times daily. 11/20/19  Yes Hensel, Jamal Collin, MD  Blood Glucose Monitoring Suppl (TRUE METRIX METER) w/Device KIT USE AS DIRECTED 03/25/20  Yes Hensel, Jamal Collin, MD  carvedilol (COREG) 12.5 MG tablet Take 0.5 tablets (6.25 mg total) by mouth 2 (two) times daily with a meal. 10/14/20  Yes Hensel, Jamal Collin, MD  clopidogrel (PLAVIX) 75 MG tablet Take 1 tablet (75 mg total) by mouth daily with breakfast. 11/18/20  Yes Carollee Leitz, MD  furosemide (LASIX) 40 MG tablet Take 1 tablet (40 mg total) by mouth daily as needed for fluid (swelling). 10/14/20  Yes Hensel, Jamal Collin, MD  gabapentin (NEURONTIN) 100 MG capsule Take 2 capsules (200 mg total) by mouth 3 (three) times daily. 11/17/20  Yes Carollee Leitz, MD  glucose blood (RELION TRUE METRIX TEST STRIPS) test strip Use to test blood sugar three times per day. 11/20/19  Yes Hensel, Jamal Collin, MD  insulin NPH-regular Human (NOVOLIN 70/30 RELION) (70-30) 100 UNIT/ML injection Inject 15-30 Units  into the skin See admin instructions. Inject 30 units into the skin with breakfast and 15 units with supper   Yes [provider]  TRUEplus Lancets 33G MISC Use to test blood sugar three times per day. 11/20/19  Yes Hensel, Jamal Collin, MD  warfarin (COUMADIN) 7.5 MG tablet Take 1 tablet (7.5 mg total) by mouth one time only at 4 PM. 11/17/20  Yes Carollee Leitz, MD  enoxaparin (LOVENOX) 100 MG/ML injection Inject 1 mL (100 mg total) into the skin every 12 (twelve) hours for 4 days. Patient not taking: No sig reported 11/17/20 11/21/20  Delora Fuel, MD  metFORMIN (GLUCOPHAGE) 1000 MG tablet Take 1 tablet (1,000 mg total) by mouth daily. 09/18/20   [name], MD  Multiple Vitamin (MULTIVITAMIN WITH MINERALS) TABS tablet Take 1 tablet by mouth daily. Patient not taking: No sig reported 11/18/20   Carollee Leitz, MD  nicotine (NICODERM CQ - DOSED IN MG/24 HOURS) 14 mg/24hr patch Place 1 patch (14 mg total) onto  the skin daily. Patient not taking: No sig reported 11/18/20   Carollee Leitz, MD  nutrition supplement, JUVEN, (JUVEN) PACK Take 1 packet by mouth 2 (two) times daily between meals. Patient not taking: No sig reported 11/17/20   Carollee Leitz, MD  oxyCODONE (OXY IR/ROXICODONE) 5 MG immediate release tablet Take 1 tablet (5 mg total) by mouth every 8 (eight) hours as needed for up to 3 days for moderate pain (pain score 4-6). Patient not taking: No sig reported 11/17/20 11/20/20  Carollee Leitz, MD  senna (SENOKOT) 8.6 MG TABS tablet Take 1 tablet (8.6 mg total) by mouth daily. Patient not taking: No sig reported 11/18/20   Carollee Leitz, MD  umeclidinium bromide (INCRUSE ELLIPTA) 62.5 MCG/INH AEPB Inhale 1 puff into the lungs daily. Patient not taking: No sig reported 11/18/20   Carollee Leitz, MD  zinc sulfate 220 (50 Zn) MG capsule Take 1 capsule (220 mg total) by mouth daily. Patient not taking: No sig reported 11/18/20   Carollee Leitz, MD   DG Chest 2 View  Result Date: 11/18/2020 CLINICAL  DATA:  Shortness of breath and productive cough since 11/14/2020. EXAM: CHEST - 2 VIEW COMPARISON:  Single-view of the chest 11/17/2020. FINDINGS: The patient has small bilateral pleural effusions. The patient's left effusion has decreased since the prior examination. The right effusion is new. There is cardiomegaly and mild interstitial edema. No consolidative process is identified. Aortic atherosclerosis is noted. IMPRESSION: Small right pleural effusion is new since the prior examination. Small left pleural effusion has decreased since the prior study. Cardiomegaly and interstitial edema appear unchanged. Aortic Atherosclerosis (ICD10-I70.0). Electronically Signed   By: Inge Rise M.D.   On: 11/18/2020 11:43   DG Foot Complete Right  Result Date: 11/18/2020 CLINICAL DATA:  Toe amputation last Thursday with foot blisters. EXAM: RIGHT FOOT COMPLETE - 3+ VIEW COMPARISON:  11/07/2020 FINDINGS: Diffuse bandage limiting assessment of soft tissues. There has been interval great toe amputation with unremarkable first metatarsal head. No fracture or dislocation. No acute erosion is seen. There is diffuse osteopenia and arterial calcification. IMPRESSION: 1. Limited by overlapping bandage, especially for detecting soft tissue emphysema. 2. No bony fracture or erosion. Electronically Signed   By: Monte Fantasia M.D.   On: 11/18/2020 11:41   - pertinent xrays, CT, MRI studies were reviewed and independently interpreted  Positive ROS: All other systems have been reviewed and were otherwise negative with the exception of those mentioned in the HPI and as above.  Physical Exam: General: Alert, no acute distress Psychiatric: Patient is competent for consent with normal mood and affect Lymphatic: No axillary or cervical lymphadenopathy Cardiovascular: No pedal edema Respiratory: No cyanosis, no use of accessory musculature GI: No organomegaly, abdomen is soft and  non-tender    Images:  _0 @  Labs:  Lab Results  Component Value Date   HGBA1C 6.1 10/14/2020   HGBA1C 6.5 (A) 07/03/2020   HGBA1C 7.5 (H) 04/03/2020   CRP 3.6 (H) 11/10/2020   REPTSTATUS 11/12/2020 FINAL 11/07/2020   REPTSTATUS 11/12/2020 FINAL 11/07/2020   CULT  11/07/2020    NO GROWTH 5 DAYS Performed at [facility], [place] [address], [place], [place] [address]    CULT  11/07/2020    NO GROWTH 5 DAYS Performed at [facility] [address], [place],  [address]     Lab Results  Component Value Date   ALBUMIN 2.4 (L) 11/19/2020   ALBUMIN 2.8 (L) 11/18/2020   ALBUMIN 2.4 (  L) 11/14/2020     CBC EXTENDED Latest Ref Rng & Units 11/19/2020 11/18/2020 11/17/2020  WBC 4.0 - 10.5 K/uL 8.7 10.3 -  RBC 4.22 - 5.81 MIL/uL 3.19(L) 3.35(L) -  HGB 13.0 - 17.0 g/dL 9.5(L) 9.8(L) 9.6(L)  HCT 39.0 - 52.0 % 31.4(L) 31.8(L) 30.0(L)  PLT 150 - 400 K/uL 287 299 -  NEUTROABS 1.7 - 7.7 K/uL - 8.4(H) -  LYMPHSABS 0.7 - 4.0 K/uL - 0.8 -    Neurologic: Patient does not have protective sensation bilateral lower extremities.   MUSCULOSKELETAL:   Skin: Examination there is multiple large superficial blisters on the right lower extremity.  There is no cellulitis no signs of infection.  The wound edges of the great toe amputation have healthy granulation tissue no necrosis no dehiscence no drainage no odor.  Assessment: Assessment: Large venous blisters right lower extremity status post revascularization to the right lower extremity and status post great toe amputation.  Plan: Plan: I will order 20-30 mmHg compression socks for the patient to wear around-the-clock.  I will follow-up in the office at discharge and will follow-up during his hospitalizations.  Thank you for the consult and the opportunity to see Mr. Rodney [name], [place] [phone] 8:45 AM

## 2020-11-19 NOTE — Progress Notes (Signed)
Right lower extremity venous duplex has been completed. Preliminary results can be found in CV Proc through chart review.   11/19/20 9:32 AM Rodney [name] RVT

## 2020-11-19 NOTE — ED Notes (Signed)
Pt placed on hospital bed for comfort. Leg elevated with pillows and blankets.

## 2020-11-19 NOTE — Consult Note (Signed)
[facility] Nurse Consult Note: Patient receiving care in [facility] Reason for Consult: RLE wound Wound type: Previous right great toe amputation by Dr. Sharol Given last week, returned to the ED today r/t RLE venous blistering on the RLE. Dr. Sharol Given has seen this patient this AM (see note) and will FU and order 20-30 mmHg compression socks for him to wear around-the-clock. While inpatient we will place Xeroform over the blistered areas, cover with ABD pads and wrap with Kerlix and Ace Wrap to be changed daily or PRN soilage.  Pressure Injury POA: NA Measurement:  Upper blister: 6 x 5 x 2  Next 4.5 x 4.2 x 1 Next down 1.3 x 3 x 1.2 Next down 1.5 x 1.8 x 1 Foot 3.8 x 3.2 x 2      Drainage (amount, consistency, odor) clear drainage oozing  Dressing procedure/placement/frequency: See above  Monitor the wound area(s) for worsening of condition such as: Signs/symptoms of infection, increase in size, development of or worsening of odor, development of pain, or increased pain at the affected locations.   Notify the medical team if any of these develop.  Thank you for the consult. [facility] nurse will not follow at this time.   Please re-consult the [facility] team if needed.  Cathlean Marseilles Tamala Julian, MSN, RN, [place], [place], [facility] Wound Treatment Associate Pager [phone]

## 2020-11-19 NOTE — Progress Notes (Signed)
ANTICOAGULATION CONSULT NOTE - Initial Consult  Pharmacy Consult for heparin and coumadin Indication: atrial fibrillation  Not on File  Patient Measurements: Height: 6' (182.9 cm) Weight: 110 kg (242 lb 8.1 oz) IBW/kg (Calculated) : 77.6 Heparin Dosing Weight: 100.9 kg  Vital Signs: Temp: 98.9 F (37.2 C) (07/25 2237) Temp Source: Oral (07/25 2116) BP: 127/69 (07/26 0006) Pulse Rate: 72 (07/26 0006)  Labs: Recent Labs    11/16/20 0440 11/16/20 1521 11/17/20 0115 11/17/20 0942 11/18/20 1058  HGB 7.6*   < > 8.7* 9.6* 9.8*  HCT 24.1*   < > 27.2* 30.0* 31.8*  PLT 222  --  200  --  299  LABPROT 16.3*  --  16.1*  --   --   INR 1.3*  --  1.3*  --   --   HEPARINUNFRC 0.55  --  0.59  --   --   CREATININE 1.48*  --  1.53*  --  1.48*   < > = values in this interval not displayed.    Estimated Creatinine Clearance: 61.2 mL/min (A) (by C-G formula based on SCr of 1.48 mg/dL (H)).   Medical History: Past Medical History:  Diagnosis Date   CHF (congestive heart failure) (HCC)    Coronary artery disease    Diabetes mellitus without complication ([place])    HLD (hyperlipidemia)    Hypertension    Peripheral edema 02/24/2018   SOB (shortness of breath) 07/26/2018    Medications:  See medication history  Assessment: 70 yo man readmitted today to resume heparin and coumadin for afib.  He previously was on heparin drip at 2150 units/hr.   Goal of Therapy:  INR 2-3 Heparin level 0.3-0.7 units/ml Monitor platelets by anticoagulation protocol: Yes   Plan:  Restart heparin drip at 2150 units/hr Check heparin level in 6-8 hours Daily HL, CBC PT/INR Monitor for bleeding complications  Rodney [name] 11/19/2020,12:30 AM

## 2020-11-19 NOTE — ED Notes (Signed)
Posterior  EKG complete.

## 2020-11-19 NOTE — ED Notes (Signed)
MD made aware of patient trop. MD returned phone call

## 2020-11-19 NOTE — Progress Notes (Signed)
Troponin ordered overnight which was 1462. Saw pt at bedside this morning who denies chest pain. Trend troponins and follow up EKG. Also have ordered echo.   Received message from [place] who looked after patient last night in ER. He has had called cardiology consult based on troponins.   Lattie Haw MD, PGY-3 [place]

## 2020-11-19 NOTE — Progress Notes (Addendum)
Spoke to Dr Kerrin Champagne, who recommended continuing abx and elevating RLE. MRI foot is not indicated currently. Dr Sharol Given will see the patient tomorrow am.  Lattie Haw MD  PGY-3, [place]

## 2020-11-19 NOTE — ED Notes (Signed)
Report called to [place], RN on 6N. All questions answered.

## 2020-11-19 NOTE — ED Provider Notes (Signed)
Discussed elevated troponin with Dr. Blossom Hoops, cardiology, who will have the morning team see the patient.  Patient on heparin.    Denies CP.  Reports he has had some SOB for the past day or so.   Montine Circle, PA-C 11/19/20 8719    Ezequiel Essex, MD 11/19/20 [phone]

## 2020-11-19 NOTE — Progress Notes (Addendum)
Family Medicine Teaching Service Daily Progress Note Intern Pager: [phone]  Patient name: Rodney [name] Medical record number: [id number] Date of birth: 06/30/1950 Age: 70 y.o. Gender: male  Primary Care Provider: [name], MD Consultants: Ortho Code Status: Full  Pt Overview and Major Events to Date:  7/25 Admitted   Assessment and Plan: 70 year old male who presents with right LE blisters, edema, and edema.  PMH is significant for A. fib, HFpEF, Aortic Stenosis, T2DM, CKD stage III, anemia of chronic disease, HTN, HLD, CAD and PAD.   Right lower extremity cellulitis with blisters Patient readmitted in the ED for cellulitis on the right foot postop day 4 of right great toe amputation.  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 He denies having any pain on the RLE today. Right great Toe Amputation-7/22 -Ortho following, appreciate recs -Wound care following -Stop clindamycin and start Metronidazole -Vancomycin, Cefepime, metronidazole for abx -F/u wound cx  -PT/OT -Tylenol 650 mg q6h prn -Oxycodone 5 mg q8h prn   HFpEF Aortic Stenosis  Patient's last echo showed moderate LVH and grade 2 diastolic dysfunction with EF of 50 to 55% (07/30/20) -echo f/u -Strict I/Os -daily weight -Cardiac monitoring -Continue Carvedilol  High Troponin Troponin this morning was 1462 >1154. Cardiology is following patient and suggest high troponin is most likely due to demand ischemia 2/2 to the RLE cellulitis. -Cardiology following, appreciate recs -F/u EKG -F/u Echo -Continue trending troponin  Stage III CKD Creatinine this morning is 1.47.  Patient not on dialysis -Monitor daily BMP -Avoid nephrotoxic agents  Type 2 diabetes with neuropathy Latest CBG this morning was 140.  Anemia of chronic disease Hgb this morning was 9.1 -Continue daily CBC  -Transfusion threshold is 8  COPD  tobacco use  disorder -Continue Umeclidinium -Nicotine patch 14 mg daily  Hypertension  BP this AM was 131/76. Home medication includes Losartan -hold losartan.  Hyperlipidemia CAD  PAD Home medication include Plavix 75 mg daily, Atorvastatin 80 mg daily -Continue Plavix 75 mg daily  -Continue Atorvastatin 80 mg daily    FEN/GI: Heart healthy diet PPx: Warfarin bridging with heparin Dispo:Home pending clinical improvement . Barriers include .  Clinical status   Subjective:  Patient said he is doing well and not in active distress. He denies any LE pain.  Objective: Temp:  [98 F (36.7 C)-99 F (37.2 C)] 98.3 F (36.8 C) (07/26 0725) Pulse Rate:  [70-92] 83 (07/26 0725) Resp:  [14-20] 17 (07/26 0725) BP: (121-141)/(68-78) 131/76 (07/26 0725) SpO2:  [91 %-100 %] 96 % (07/26 0725) Weight:  [110 kg] 110 kg (07/25 1042) Physical Exam: General: Awake, pleasant affect, NAD Cardiovascular: RRR Respiratory: Bilateral wheezing Abdomen: No distension or tenderness Extremities: Presence of multiple bullae and blisters along the RLE with + 3 edema. Absence of right great toe from previous amputation procedure.  Laboratory: Recent Labs  Lab 11/17/20 0115 11/17/20 0942 11/18/20 1058 11/19/20 0358  WBC 7.9  --  10.3 8.7  HGB 8.7* 9.6* 9.8* 9.5*  HCT 27.2* 30.0* 31.8* 31.4*  PLT 200  --  299 287   Recent Labs  Lab 11/14/20 0327 11/15/20 0615 11/17/20 0115 11/18/20 1058 11/19/20 0358  NA 135   < > 135 135 137  K 4.8   < > 5.0 4.9 4.8  CL 105   < > 107 104 108  CO2 23   < > 23 22 21*  BUN 38*   < >  35* 39* 42*  CREATININE 1.27*   < > 1.53* 1.48* 1.47*  CALCIUM 8.6*   < > 8.2* 9.1 8.8*  PROT 5.7*  --   --  6.9 6.0*  BILITOT 0.5  --   --  0.6 0.5  ALKPHOS 46  --   --  56 48  ALT 11  --   --  12 12  AST 12*  --   --  19 20  GLUCOSE 209*   < > 129* 181* 155*   < > = values in this interval not displayed.     Imaging/Diagnostic Tests: No image findings  Alen Bleacher,  MD 11/19/2020, 7:50 AM PGY-1, [place] Intern pager: [phone], text pages welcome

## 2020-11-19 NOTE — Progress Notes (Signed)
ANTICOAGULATION CONSULT NOTE - Initial Consult  Pharmacy Consult for heparin and coumadin Indication: atrial fibrillation  No Known Allergies  Patient Measurements: Height: 6' (182.9 cm) Weight: 110 kg (242 lb 8.1 oz) IBW/kg (Calculated) : 77.6 Heparin Dosing Weight: 100.9 kg  Vital Signs: Temp: 98.3 F (36.8 C) (07/26 0725) Temp Source: Oral (07/26 0725) BP: 131/76 (07/26 0725) Pulse Rate: 83 (07/26 0725)  Labs: Recent Labs    11/17/20 0115 11/17/20 0942 11/18/20 1058 11/19/20 0358 11/19/20 0558  HGB 8.7* 9.6* 9.8* 9.5*  --   HCT 27.2* 30.0* 31.8* 31.4*  --   PLT 200  --  299 287  --   LABPROT 16.1*  --   --  16.3*  --   INR 1.3*  --   --  1.3*  --   HEPARINUNFRC 0.59  --   --  0.12*  --   CREATININE 1.53*  --  1.48* 1.47*  --   TROPONINIHS  --   --   --  1,462* 1,154*     Estimated Creatinine Clearance: 61.6 mL/min (A) (by C-G formula based on SCr of 1.47 mg/dL (H)).   Medical History: Past Medical History:  Diagnosis Date   Aortic stenosis    moderate in 2022   Atrial fibrillation (HCC)    CHF (congestive heart failure) (HCC)    Coronary artery disease    Diabetes mellitus without complication (HCC)    HLD (hyperlipidemia)    Hypertension    Peripheral arterial disease [facility])     Assessment: 70 yo man on warfarin for afib and hx DVT April 2022, now being held for possible procedure for RLE wounds. Admit INR 1.3.  Pharmacy consulted for heparin and warfarin. Patient was recently therapeutic on heparin drip at 2150 units/hr.   HL delayed, still pending at this time.  INR has been subtherapeutic since 7/16. OK to resume warfarin per team.   Warfarin PTA dose starting 7/24: 7.5mg  x1 then 5mg  daily with enoxaparin 100mg  Q12 hr x 7 day bridge but patient reported did not start taking enoxaparin yet since was readmitted on 7/25  Goal of Therapy:  INR 2-3 Heparin level 0.3-0.7 units/ml Monitor platelets by anticoagulation protocol: Yes   Plan:   Continue heparin drip at 2150 units/hr - f/u HL Warfarin 10mg  x1  Monitor daily INR, HL, CBC/plt Monitor for signs/symptoms of bleeding   Benetta Spar, PharmD, BCPS, BCCP Clinical Pharmacist  Please check AMION for all [place] phone numbers After 10:00 PM, call [place]

## 2020-11-19 NOTE — Progress Notes (Signed)
ANTICOAGULATION CONSULT NOTE - Follow Up Consult  Pharmacy Consult for IV Heparin and Warfarin Indication: atrial fibrillation  No Known Allergies  Patient Measurements: Height: 6' (182.9 cm) Weight: 110 kg (242 lb 8.1 oz) IBW/kg (Calculated) : 77.6 Heparin Dosing Weight: 100.9 kg  Vital Signs: Temp: 98.3 F (36.8 C) (07/26 0725) Temp Source: Oral (07/26 0725) BP: 125/65 (07/26 1349) Pulse Rate: 72 (07/26 1349)  Labs: Recent Labs    11/17/20 0115 11/17/20 0942 11/18/20 1058 11/19/20 0358 11/19/20 0558 11/19/20 1450  HGB 8.7* 9.6* 9.8* 9.5*  --   --   HCT 27.2* 30.0* 31.8* 31.4*  --   --   PLT 200  --  299 287  --   --   LABPROT 16.1*  --   --  16.3*  --   --   INR 1.3*  --   --  1.3*  --   --   HEPARINUNFRC 0.59  --   --  0.12*  --  0.26*  CREATININE 1.53*  --  1.48* 1.47*  --   --   TROPONINIHS  --   --   --  1,462* 1,154*  --     Estimated Creatinine Clearance: 61.6 mL/min (A) (by C-G formula based on SCr of 1.47 mg/dL (H)).  Medical History: Past Medical History:  Diagnosis Date   Aortic stenosis    moderate in 2022   Atrial fibrillation (HCC)    CHF (congestive heart failure) (HCC)    Coronary artery disease    Diabetes mellitus without complication (HCC)    HLD (hyperlipidemia)    Hypertension    Peripheral arterial disease [facility])     Assessment: 70 yr old man on warfarin for afib and hx DVT in April 2022; warfarin now being held for possible procedure for RLE wounds. Admit INR 1.3.  Pharmacy was consulted for heparin and warfarin dosing. Patient was recently therapeutic on heparin infusion at 2150 units/hr.   Heparin level ~12.5 hrs after starting heparin infusion at 2150 units/hr was 0.26 units/ml, which is below the goal range for this pt. H/H 9.5/31.4, plt 287 (CBC stable). Per ED RN, no issues with IV or bleeding observed.  INR has been subtherapeutic since 7/16. OK to resume warfarin per team.   Warfarin PTA dose starting 7/24: 7.5 mg X 1, then  5 mg daily with enoxaparin 100 mg Q12 hr x 7 day bridge, but patient reportedly did not start taking enoxaparin yet, as he was was readmitted on 7/25  Goal of Therapy:  INR 2-3 Heparin level 0.3-0.7 units/ml Monitor platelets by anticoagulation protocol: Yes   Plan:  Increase heparin infusion to 2350 units/ml Check 6-hr heparin level Warfarin 10 mg X 1 today Monitor daily INR, heparin level, CBC Monitor for signs/symptoms of bleeding   Gillermina Hu, PharmD, BCPS, [facility] Clinical Pharmacist

## 2020-11-19 NOTE — Consult Note (Signed)
Cardiology Consultation:   Patient ID: Rodney [name] MRN: [id number]; DOB: Dec 13, 1950  Admit date: 11/18/2020 Date of Consult: 11/19/2020  PCP:  [name], MD   [place] Providers Cardiologist:  Evalina Field, MD  Patient Profile:   Rodney [name] is a 70 y.o. 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 Ardelia Mems.  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 He developed Afib postoperatively. Echo at that time showed EF 35-40% and moderate AS. Diagnostic heart cath at that time showed CTO of RCA. He was diagnosed with mixed ICM/NICM. He underwent TEE-guided DCCV 10/05/19. He unfortunately was unable to afford eliquis. When seen in follow up in July 2021, he was back in Afib without anticoagulation. Amiodarone was D/C'ed; however, notes indicate he may have continued this. He was started on coumadin. In V 01/2020, he was back in NSR. He was continued on amiodarone.   He was hospitalized 07/2020 with blisters on R LE and gangrenous toes. He was seen by [facility] for critical right lower extremity ischemia and underwent right superficial femoral artery to below knee popliteal artery bypass. He was readmitted with swelling and blisters, found to have a DVT in the R LE (?coumadin failure, ?precipitated by interruption in [facility] for surgery). He was admitted again in July 2022 with concern for impending bypass graft failure. [facility] was unable to complete a redo revascularization and instead treated bypass with angioplasty and stent in right SFA-pop bypass. Treated for right toe osteomyelitis. He ultimately underwent right great toe amputation.   He presented back to [facility] 11/18/20 with concerns for cellulitis in her right lower extremity with blisters and spreading redness. Pt denies  chest pain, but troponin was drawn and resulted at 1462 --> 1154. Cardiology was consulted for elevated troponin and abnormal EKG. BNP was 200.  During my interview, he denies chest pain, but does report dyspnea with lying flat for about 1 week. He also states his PCP D/C'ed his 25 mg spironolactone, and reduced his coreg to 3.125 mg and 12.5 mg losartan. Lasix 40 mg was transitioned from daily to PRN. Sounds like these changes were made for marginal BP. He states he feels like he has gained fluid and has only taken PRN lasix one day for leg swelling. He does not weigh daily. Other symptoms of CHF such as orthopnea were not discussed at his PCP visit. He has attributed orthopnea to his recent smoking cessation and concomitant cough.    Past Medical History:  Diagnosis Date   Aortic stenosis    moderate in 2022   Atrial fibrillation (HCC)    CHF (congestive heart failure) ([place])    Coronary artery disease    Diabetes mellitus without complication (HCC)    HLD (hyperlipidemia)    Hypertension    Peripheral arterial disease ([place])     Past Surgical History:  Procedure Laterality Date   ABDOMINAL AORTOGRAM W/LOWER EXTREMITY N/A 08/05/2020   Procedure: ABDOMINAL AORTOGRAM W/LOWER EXTREMITY;  Surgeon: Marty Heck, MD;  Location: [place] [facility];  Service: Cardiovascular;  Laterality: N/A;   ABDOMINAL AORTOGRAM W/LOWER EXTREMITY N/A 11/13/2020   Procedure: ABDOMINAL AORTOGRAM W/LOWER EXTREMITY;  Surgeon: Cherre Robins, MD;  Location: [place] [facility];  Service: Cardiovascular;  Laterality:  N/A;   AMPUTATION Left 09/28/2019   Procedure: AMPUTATION BELOW KNEE;  Surgeon: Newt Minion, MD;  Location: [place];  Service: Orthopedics;  Laterality: Left;   AMPUTATION Right 11/15/2020   Procedure: RIGHT GREAT TOE AMPUTATION;  Surgeon: Newt Minion, MD;  Location: [place];  Service: Orthopedics;  Laterality: Right;   CARDIAC CATHETERIZATION     CARDIOVERSION N/A 10/05/2019   Procedure:  CARDIOVERSION;  Surgeon: Sanda Klein, MD;  Location: [place] [facility];  Service: Cardiovascular;  Laterality: N/A;   FEMORAL-POPLITEAL BYPASS GRAFT Right 08/07/2020   Procedure: RIGHT FEMORAL TO BELOW KNEE POPLITEAL ARTERY BYPASS;  Surgeon: Waynetta Sandy, MD;  Location: [place];  Service: Vascular;  Laterality: Right;   LEFT HEART CATH AND CORONARY ANGIOGRAPHY N/A 10/03/2019   Procedure: LEFT HEART CATH AND CORONARY ANGIOGRAPHY;  Surgeon: Lorretta Harp, MD;  Location: [place] [facility];  Service: Cardiovascular;  Laterality: N/A;   PERIPHERAL VASCULAR INTERVENTION Right 08/05/2020   Procedure: PERIPHERAL VASCULAR INTERVENTION;  Surgeon: Marty Heck, MD;  Location: [place] [facility];  Service: Cardiovascular;  Laterality: Right;  common Iliac   PERIPHERAL VASCULAR INTERVENTION Left 11/13/2020   Procedure: PERIPHERAL VASCULAR INTERVENTION;  Surgeon: Cherre Robins, MD;  Location: [place] [facility];  Service: Cardiovascular;  Laterality: Left;   PERIPHERAL VASCULAR INTERVENTION Right 11/14/2020   Procedure: PERIPHERAL VASCULAR INTERVENTION;  Surgeon: Marty Heck, MD;  Location: [place] [facility];  Service: Cardiovascular;  Laterality: Right;  POP/SFA STENT   TEE WITHOUT CARDIOVERSION N/A 10/05/2019   Procedure: TRANSESOPHAGEAL ECHOCARDIOGRAM (TEE);  Surgeon: Sanda Klein, MD;  Location: [facility];  Service: Cardiovascular;  Laterality: N/A;     Home Medications:  Prior to Admission medications   Medication Sig Start Date End Date Taking? Authorizing Provider  acetaminophen (TYLENOL) 325 MG tablet Take 1-2 tablets (325-650 mg total) by mouth every 6 (six) hours as needed for mild pain (pain score 1-3 or temp > 100.5). 11/17/20  Yes Carollee Leitz, MD  amiodarone (PACERONE) 200 MG tablet Take 1 tablet (200 mg total) by mouth daily. 10/14/20  Yes Hensel, Jamal Collin, MD  atorvastatin (LIPITOR) 80 MG tablet TAKE 1 TABLET EVERY DAY Patient taking differently: Take 80 mg by  mouth daily. 03/25/20  Yes O'Neal, Cassie Freer, MD  Blood Glucose Monitoring Suppl (TRUE METRIX METER) DEVI Use to test blood sugar three times daily. 11/20/19  Yes Hensel, Jamal Collin, MD  Blood Glucose Monitoring Suppl (TRUE METRIX METER) w/Device KIT USE AS DIRECTED 03/25/20  Yes Hensel, Jamal Collin, MD  carvedilol (COREG) 12.5 MG tablet Take 0.5 tablets (6.25 mg total) by mouth 2 (two) times daily with a meal. 10/14/20  Yes Hensel, Jamal Collin, MD  clopidogrel (PLAVIX) 75 MG tablet Take 1 tablet (75 mg total) by mouth daily with breakfast. 11/18/20  Yes Carollee Leitz, MD  furosemide (LASIX) 40 MG tablet Take 1 tablet (40 mg total) by mouth daily as needed for fluid (swelling). 10/14/20  Yes Hensel, Jamal Collin, MD  gabapentin (NEURONTIN) 100 MG capsule Take 2 capsules (200 mg total) by mouth 3 (three) times daily. 11/17/20  Yes Carollee Leitz, MD  glucose blood (RELION TRUE METRIX TEST STRIPS) test strip Use to test blood sugar three times per day. 11/20/19  Yes Hensel, Jamal Collin, MD  insulin NPH-regular Human (NOVOLIN 70/30 RELION) (70-30) 100 UNIT/ML injection Inject 15-30 Units into the skin See admin instructions. Inject 30 units into the skin with breakfast and 15 units with supper  Yes [provider]  TRUEplus Lancets 33G MISC Use to test blood sugar three times per day. 11/20/19  Yes Hensel, Jamal Collin, MD  warfarin (COUMADIN) 7.5 MG tablet Take 1 tablet (7.5 mg total) by mouth one time only at 4 PM. 11/17/20  Yes Carollee Leitz, MD  enoxaparin (LOVENOX) 100 MG/ML injection Inject 1 mL (100 mg total) into the skin every 12 (twelve) hours for 4 days. Patient not taking: No sig reported 11/17/20 11/21/20  Delora Fuel, MD  metFORMIN (GLUCOPHAGE) 1000 MG tablet Take 1 tablet (1,000 mg total) by mouth daily. 09/18/20   [name], MD  Multiple Vitamin (MULTIVITAMIN WITH MINERALS) TABS tablet Take 1 tablet by mouth daily. Patient not taking: No sig reported 11/18/20   Carollee Leitz, MD  nicotine  (NICODERM CQ - DOSED IN MG/24 HOURS) 14 mg/24hr patch Place 1 patch (14 mg total) onto the skin daily. Patient not taking: No sig reported 11/18/20   Carollee Leitz, MD  nutrition supplement, JUVEN, (JUVEN) PACK Take 1 packet by mouth 2 (two) times daily between meals. Patient not taking: No sig reported 11/17/20   Carollee Leitz, MD  oxyCODONE (OXY IR/ROXICODONE) 5 MG immediate release tablet Take 1 tablet (5 mg total) by mouth every 8 (eight) hours as needed for up to 3 days for moderate pain (pain score 4-6). Patient not taking: No sig reported 11/17/20 11/20/20  Carollee Leitz, MD  senna (SENOKOT) 8.6 MG TABS tablet Take 1 tablet (8.6 mg total) by mouth daily. Patient not taking: No sig reported 11/18/20   Carollee Leitz, MD  umeclidinium bromide (INCRUSE ELLIPTA) 62.5 MCG/INH AEPB Inhale 1 puff into the lungs daily. Patient not taking: No sig reported 11/18/20   Carollee Leitz, MD  zinc sulfate 220 (50 Zn) MG capsule Take 1 capsule (220 mg total) by mouth daily. Patient not taking: No sig reported 11/18/20   Carollee Leitz, MD    Inpatient Medications: Scheduled Meds:  amiodarone  200 mg Oral Daily   atorvastatin  80 mg Oral Daily   carvedilol  6.25 mg Oral BID WC   clopidogrel  75 mg Oral Q breakfast   feeding supplement (NEPRO CARB STEADY)  237 mL Oral BID BM   furosemide  40 mg Intravenous Daily   gabapentin  200 mg Oral TID   insulin aspart  0-15 Units Subcutaneous TID WC   multivitamin  1 tablet Oral QHS   nicotine  14 mg Transdermal Daily   umeclidinium bromide  1 puff Inhalation Daily   Continuous Infusions:  ceFEPime (MAXIPIME) IV 2 g (11/19/20 1319)   heparin 2,150 Units/hr (11/19/20 0743)   metronidazole     vancomycin     PRN Meds: acetaminophen, oxyCODONE  Allergies:   No Known Allergies  Social History:   Social History   Socioeconomic History   Marital status: Widowed    Spouse name: Not on file   Number of children: Not on file   Years of education: Not on file    Highest education level: Not on file  Occupational History   Not on file  Tobacco Use   Smoking status: Every Day    Packs/day: 0.50    Years: 50.00    Pack years: 25.00    Types: Cigarettes   Smokeless tobacco: Never  Vaping Use   Vaping Use: Never used  Substance and Sexual Activity   Alcohol use: Yes    Alcohol/week: 6.0 standard drinks    Types: 6 Standard drinks or equivalent  per week    Comment: 11/07/20 - states he has not drank in 6 months   Drug use: No   Sexual activity: Yes    Partners: Female    Comment: monagamous stable relationship  Other Topics Concern   Not on file  Social History Narrative   Not on file   Social Determinants of Health   Financial Resource Strain: Not on file  Food Insecurity: Not on file  Transportation Needs: Not on file  Physical Activity: Not on file  Stress: Not on file  Social Connections: Not on file  Intimate Partner Violence: Not on file    Family History:    Family History  Problem Relation Age of Onset   Alcoholism Mother    Alcoholism Father      ROS:  Please see the history of present illness.   All other ROS reviewed and negative.     Physical Exam/Data:   Vitals:   11/19/20 0341 11/19/20 0646 11/19/20 0725 11/19/20 1213  BP: 125/68 130/75 131/76 121/67  Pulse: 72 70 83 73  Resp: 14 18 17 20   Temp:  98.3 F (36.8 C) 98.3 F (36.8 C)   TempSrc:  Oral Oral   SpO2: 94% 97% 96% 97%  Weight:      Height:       No intake or output data in the 24 hours ending 11/19/20 1346 Last 3 Weights 11/18/2020 11/17/2020 11/16/2020  Weight (lbs) 242 lb 8.1 oz 243 lb 13.3 oz 238 lb 15.7 oz  Weight (kg) 110 kg 110.6 kg 108.4 kg     Body mass index is 32.89 kg/m.  General:  elderly male, obese male HEENT: normal Lymph: no adenopathy Neck: no JVD Vascular: can hear systolic murmur in carotids Cardiac:  normal S1, S2; RRR; 4/6 systolic murmur Lungs:  clear to auscultation bilaterally, no wheezing, rhonchi or rales  Abd:  soft, nontender, no hepatomegaly  Ext: lower right leg wrapped, mild edema above wrap Musculoskeletal:  left BKA, right leg with great toe amputation, let wrapped Skin: warm and dry  Neuro:  CNs 2-12 intact, no focal abnormalities noted Psych:  Normal affect   EKG:  The EKG was personally reviewed and demonstrates:  sinus rhythm with HR 72, ST depression in inferior and lateral leads Telemetry:  Telemetry was personally reviewed and demonstrates:  sinus rhythm in the 70s  Relevant CV Studies:  Echo 11/19/20: 1. Left ventricular ejection fraction, by estimation, is 50 to 55%. The  left ventricle has low normal function. The left ventricle has no regional  wall motion abnormalities. The left ventricular internal cavity size was  mildly dilated. Left ventricular  diastolic parameters are consistent with Grade II diastolic dysfunction  (pseudonormalization). Elevated left ventricular end-diastolic pressure.   2. Right ventricular systolic function is normal. The right ventricular  size is normal. There is normal pulmonary artery systolic pressure.   3. The mitral valve is normal in structure. Trivial mitral valve  regurgitation. No evidence of mitral stenosis. Moderate mitral annular  calcification.   4. The aortic valve is calcified. There is moderate calcification of the  aortic valve. Aortic valve regurgitation is trivial. Moderate aortic valve  stenosis. Aortic valve area, by VTI measures 0.73 cm. Aortic valve mean  gradient measures 35.0 mmHg.  Aortic valve Vmax measures 3.69 m/s.   5. Aortic dilatation noted. There is mild dilatation of the ascending  aorta, measuring 39 mm.   6. The inferior vena cava is dilated in size with >  50% respiratory  variability, suggesting right atrial pressure of 8 mmHg.   Laboratory Data:  High Sensitivity Troponin:   Recent Labs  Lab 11/19/20 0358 11/19/20 0558  TROPONINIHS 1,462* 1,154*     Chemistry Recent Labs  Lab 11/17/20 0115  11/18/20 1058 11/19/20 0358  NA 135 135 137  K 5.0 4.9 4.8  CL 107 104 108  CO2 23 22 21*  GLUCOSE 129* 181* 155*  BUN 35* 39* 42*  CREATININE 1.53* 1.48* 1.47*  CALCIUM 8.2* 9.1 8.8*  GFRNONAA 49* 51* 52*  ANIONGAP 5 9 8     Recent Labs  Lab 11/14/20 0327 11/18/20 1058 11/19/20 0358  PROT 5.7* 6.9 6.0*  ALBUMIN 2.4* 2.8* 2.4*  AST 12* 19 20  ALT 11 12 12   ALKPHOS 46 56 48  BILITOT 0.5 0.6 0.5   Hematology Recent Labs  Lab 11/17/20 0115 11/17/20 0942 11/18/20 1058 11/19/20 0358  WBC 7.9  --  10.3 8.7  RBC 2.90*  --  3.35* 3.19*  HGB 8.7* 9.6* 9.8* 9.5*  HCT 27.2* 30.0* 31.8* 31.4*  MCV 93.8  --  94.9 98.4  MCH 30.0  --  29.3 29.8  MCHC 32.0  --  30.8 30.3  RDW 14.8  --  14.4 14.3  PLT 200  --  299 287   BNP Recent Labs  Lab 11/17/20 0115  BNP 198.3*    DDimer No results for input(s): DDIMER in the last 168 hours.   Radiology/Studies:  DG Chest 2 View  Result Date: 11/18/2020 CLINICAL DATA:  Shortness of breath and productive cough since 11/14/2020. EXAM: CHEST - 2 VIEW COMPARISON:  Single-view of the chest 11/17/2020. FINDINGS: The patient has small bilateral pleural effusions. The patient's left effusion has decreased since the prior examination. The right effusion is new. There is cardiomegaly and mild interstitial edema. No consolidative process is identified. Aortic atherosclerosis is noted. IMPRESSION: Small right pleural effusion is new since the prior examination. Small left pleural effusion has decreased since the prior study. Cardiomegaly and interstitial edema appear unchanged. Aortic Atherosclerosis (ICD10-I70.0). Electronically Signed   By: Inge Rise M.D.   On: 11/18/2020 11:43   DG CHEST PORT 1 VIEW  Result Date: 11/17/2020 CLINICAL DATA:  Dyspnea, admitted for osteomyelitis EXAM: PORTABLE CHEST 1 VIEW COMPARISON:  08/14/2020 chest radiograph. FINDINGS: Stable cardiomediastinal silhouette with mild cardiomegaly. No pneumothorax. Asymmetric  smooth left pleural thickening throughout the peripheral left pleural space, mildly increased from prior, with adjacent curvilinear peripheral left lung opacities and mild volume loss in the left hemithorax. No right pleural effusion. Mild diffuse prominence of the parahilar interstitial markings. IMPRESSION: Mild cardiomegaly with mild diffuse prominence of the parahilar interstitial markings, suggesting mild cardiogenic pulmonary edema. Asymmetric mild volume loss in the left hemithorax. Asymmetric smooth peripheral left pleural thickening with adjacent curvilinear lung opacities, mildly increased from prior, favoring chronic pleural-parenchymal scarring, with loculated small left pleural effusion not excluded. Electronically Signed   By: Ilona Sorrel M.D.   On: 11/17/2020 09:36   DG Foot Complete Right  Result Date: 11/18/2020 CLINICAL DATA:  Toe amputation last Thursday with foot blisters. EXAM: RIGHT FOOT COMPLETE - 3+ VIEW COMPARISON:  11/07/2020 FINDINGS: Diffuse bandage limiting assessment of soft tissues. There has been interval great toe amputation with unremarkable first metatarsal head. No fracture or dislocation. No acute erosion is seen. There is diffuse osteopenia and arterial calcification. IMPRESSION: 1. Limited by overlapping bandage, especially for detecting soft tissue emphysema. 2. No bony fracture or erosion. Electronically  Signed   By: Monte Fantasia M.D.   On: 11/18/2020 11:41   ECHOCARDIOGRAM COMPLETE  Result Date: 11/19/2020    ECHOCARDIOGRAM REPORT   Patient Name:   AVISH TORRY Date of Exam: 11/19/2020 Medical Rec #:  [id number]         Height:       72.0 in Accession #:    [id number]        Weight:       242.5 lb Date of Birth:  1950/05/02        BSA:          2.312 m Patient Age:    68 years          BP:           131/76 mmHg Patient Gender: M                 HR:           80 bpm. Exam Location:  Inpatient Procedure: 2D Echo, Cardiac Doppler, Color Doppler and Intracardiac             Opacification Agent Indications:    Dyspnea R06.00  History:        Patient has prior history of Echocardiogram examinations, most                 recent 07/30/2020. CHF, CAD, Arrythmias:Atrial Fibrillation; Risk                 Factors:Hypertension, Diabetes, Current Smoker and Dyslipidemia.                 AS. PAD.  Sonographer:    Vickie Epley RDCS Referring Phys: [id number] CARINA M BROWN IMPRESSIONS  1. Left ventricular ejection fraction, by estimation, is 50 to 55%. The left ventricle has low normal function. The left ventricle has no regional wall motion abnormalities. The left ventricular internal cavity size was mildly dilated. Left ventricular diastolic parameters are consistent with Grade II diastolic dysfunction (pseudonormalization). Elevated left ventricular end-diastolic pressure.  2. Right ventricular systolic function is normal. The right ventricular size is normal. There is normal pulmonary artery systolic pressure.  3. The mitral valve is normal in structure. Trivial mitral valve regurgitation. No evidence of mitral stenosis. Moderate mitral annular calcification.  4. The aortic valve is calcified. There is moderate calcification of the aortic valve. Aortic valve regurgitation is trivial. Moderate aortic valve stenosis. Aortic valve area, by VTI measures 0.73 cm. Aortic valve mean gradient measures 35.0 mmHg. Aortic valve Vmax measures 3.69 m/s.  5. Aortic dilatation noted. There is mild dilatation of the ascending aorta, measuring 39 mm.  6. The inferior vena cava is dilated in size with >50% respiratory variability, suggesting right atrial pressure of 8 mmHg. FINDINGS  Left Ventricle: Left ventricular ejection fraction, by estimation, is 50 to 55%. The left ventricle has low normal function. The left ventricle has no regional wall motion abnormalities. Definity contrast agent was given IV to delineate the left ventricular endocardial borders. The left ventricular internal cavity size was  mildly dilated. There is no left ventricular hypertrophy. Left ventricular diastolic parameters are consistent with Grade II diastolic dysfunction (pseudonormalization). Elevated left ventricular end-diastolic pressure. Right Ventricle: The right ventricular size is normal. No increase in right ventricular wall thickness. Right ventricular systolic function is normal. There is normal pulmonary artery systolic pressure. The tricuspid regurgitant velocity is 1.57 m/s, and  with an assumed right atrial pressure of 8 mmHg, the estimated right  ventricular systolic pressure is 16.1 mmHg. Left Atrium: Left atrial size was normal in size. Right Atrium: Right atrial size was normal in size. Pericardium: There is no evidence of pericardial effusion. Mitral Valve: The mitral valve is normal in structure. Moderate mitral annular calcification. Trivial mitral valve regurgitation. No evidence of mitral valve stenosis. Tricuspid Valve: The tricuspid valve is normal in structure. Tricuspid valve regurgitation is trivial. No evidence of tricuspid stenosis. Aortic Valve: The aortic valve is calcified. There is moderate calcification of the aortic valve. Aortic valve regurgitation is trivial. Moderate aortic stenosis is present. Aortic valve mean gradient measures 35.0 mmHg. Aortic valve peak gradient measures 54.5 mmHg. Aortic valve area, by VTI measures 0.73 cm. Pulmonic Valve: The pulmonic valve was normal in structure. Pulmonic valve regurgitation is not visualized. No evidence of pulmonic stenosis. Aorta: Aortic dilatation noted. There is mild dilatation of the ascending aorta, measuring 39 mm. Venous: The inferior vena cava is dilated in size with greater than 50% respiratory variability, suggesting right atrial pressure of 8 mmHg. IAS/Shunts: No atrial level shunt detected by color flow Doppler.  LEFT VENTRICLE PLAX 2D LVIDd:         5.90 cm      Diastology LVIDs:         4.30 cm      LV e' medial:    5.84 cm/s LV PW:          1.00 cm      LV E/e' medial:  21.2 LV IVS:        1.00 cm      LV e' lateral:   8.27 cm/s LVOT diam:     2.20 cm      LV E/e' lateral: 15.0 LV SV:         66 LV SV Index:   28 LVOT Area:     3.80 cm  LV Volumes (MOD) LV vol d, MOD A2C: 152.0 ml LV vol d, MOD A4C: 174.0 ml LV vol s, MOD A2C: 62.1 ml LV vol s, MOD A4C: 68.2 ml LV SV MOD A2C:     89.9 ml LV SV MOD A4C:     174.0 ml LV SV MOD BP:      98.0 ml RIGHT VENTRICLE RV S prime:     14.80 cm/s TAPSE (M-mode): 2.6 cm LEFT ATRIUM             Index       RIGHT ATRIUM           Index LA diam:        5.50 cm 2.38 cm/m  RA Area:     20.20 cm LA Vol (A2C):   64.8 ml 28.02 ml/m RA Volume:   59.50 ml  25.73 ml/m LA Vol (A4C):   65.5 ml 28.33 ml/m LA Biplane Vol: 66.4 ml 28.71 ml/m  AORTIC VALVE AV Area (Vmax):    0.81 cm AV Area (Vmean):   0.73 cm AV Area (VTI):     0.73 cm AV Vmax:           369.00 cm/s AV Vmean:          275.500 cm/s AV VTI:            0.900 m AV Peak Grad:      54.5 mmHg AV Mean Grad:      35.0 mmHg LVOT Vmax:         78.30 cm/s LVOT Vmean:  53.000 cm/s LVOT VTI:          0.173 m LVOT/AV VTI ratio: 0.19  AORTA Ao Root diam: 3.50 cm Ao Asc diam:  3.90 cm MITRAL VALVE                TRICUSPID VALVE MV Area (PHT): 4.60 cm     TR Peak grad:   9.9 mmHg MV Decel Time: 165 msec     TR Vmax:        157.00 cm/s MR Peak grad: 112.4 mmHg MR Vmax:      530.00 cm/s   SHUNTS MV E velocity: 124.00 cm/s  Systemic VTI:  0.17 m MV A velocity: 66.10 cm/s   Systemic Diam: 2.20 cm MV E/A ratio:  1.88 Skeet Latch MD Electronically signed by Skeet Latch MD Signature Date/Time: 11/19/2020/12:04:39 PM    Final    VAS [place] LOWER EXTREMITY VENOUS (DVT)  Result Date: 11/19/2020  Lower Venous DVT Study Patient Name:  REACE BRESHEARS  Date of Exam:   11/19/2020 Medical Rec #: [id number]          Accession #:    [id number] Date of Birth: 01-28-1951         Patient Gender: M Patient Age:   69Y Exam Location:  [facility] Procedure:      VAS [place]  LOWER EXTREMITY VENOUS (DVT) Referring Phys: [place] --------------------------------------------------------------------------------  Indications: Swelling.  Risk Factors: Surgery 11/15/2020 - RIGHT GREAT TOE AMPUTATION 11/14/2020 - PERIPHERAL VASCULAR INTERVENTION. Limitations: Poor ultrasound/tissue interface and open wound. Comparison Study: Extensive arterial history. 11-10-2020 Lower extremity arterial                   duplex RT showed patent bypass graft with monophasic flow                   throughout and distally. Large area containing mixed echoes,                   measuring 4.42 X 7.88 noted in the distal thigh and proximal                   popliteal fossa. Performing Technologist: Oliver Hum RVT  Examination Guidelines: A complete evaluation includes B-mode imaging, spectral Doppler, color Doppler, and power Doppler as needed of all accessible portions of each vessel. Bilateral testing is considered an integral part of a complete examination. Limited examinations for reoccurring indications may be performed as noted. The reflux portion of the exam is performed with the patient in reverse Trendelenburg.  +---------+---------------+---------+-----------+----------+-------------------+ RIGHT    CompressibilityPhasicitySpontaneityPropertiesThrombus Aging      +---------+---------------+---------+-----------+----------+-------------------+ CFV      Full           Yes      Yes                                      +---------+---------------+---------+-----------+----------+-------------------+ SFJ      Full                                                             +---------+---------------+---------+-----------+----------+-------------------+ FV Prox  Full                                                             +---------+---------------+---------+-----------+----------+-------------------+  FV Mid   Full                                                              +---------+---------------+---------+-----------+----------+-------------------+ FV Distal               Yes      Yes                                      +---------+---------------+---------+-----------+----------+-------------------+ PFV      Full                                                             +---------+---------------+---------+-----------+----------+-------------------+ POP      Full           Yes      Yes                                      +---------+---------------+---------+-----------+----------+-------------------+ PTV      Full                                                             +---------+---------------+---------+-----------+----------+-------------------+ PERO                                                  Not well visualized +---------+---------------+---------+-----------+----------+-------------------+   +----+---------------+---------+-----------+----------+--------------+ LEFTCompressibilityPhasicitySpontaneityPropertiesThrombus Aging +----+---------------+---------+-----------+----------+--------------+ CFV                Yes      Yes                                 +----+---------------+---------+-----------+----------+--------------+     Summary: RIGHT: - There is no evidence of deep vein thrombosis in the lower extremity. However, portions of this examination were limited- see technologist comments above.  - Extensive fluid noted in the popliteal fossa surrounding the bypass graft. Fluid also noted on previous arterial examination.   *See table(s) above for measurements and observations.    Preliminary      Assessment and Plan:   Elevated troponin - hs troponin 1462 --> 1154 - EKG with ST depression inferior and lateral leads - appears slightly more exaggerated compared to prior tracings - pt specifically DENIES chest pain - etiology includes ACS (denies chest pain), anemia of chronic disease  with recent surgery and blood loss (Hb stable at his baseline), ongoing infection/sepsis (admitted for concerns for cellulitis) - echo with improved EF and no WMA - without symptoms of ischemic heart disease, will defer ischemic evaluation at this time -  suspect elevated/flat troponin due to recent surgery and possible cellulitis   Orthopnea, lower extremity swelling 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 - echo in 09/2019 with EF 35-40% - maintained on low dose coreg and losartan - recently taken off of spironolactone and daily lasix, coreg and losartan doses reduced --> GDMT has been challenging due to marginal pressure - repeat echo now shows improvement in EF to 50-55%, garde 2 DD, and continued moderate AS  - BNP on admission was 200 - difficult to know if his symptoms are true orthopnea or related to his recent smoking cessation and increased cough - symptoms are improved with is inhalers - CXR with small bilateral pleural effusions - will D/C losartan, restart 40 mg lasix tomorrow (due to receive 40 mg IV lasix this afternoon).   PAF Chronic anticoagulation with coumadin This patients CHA2DS2-VASc Score and unadjusted Ischemic Stroke Rate (% per year) is equal to 7.2 % stroke rate/year from a score of 5 (age, CAD, HTN, DM, CHF) Developed post-op following 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 Successful TEE-DCCV In 09/2019. Reverted back to Afib 11/03/19. Maintained on amiodarone, converted to sinus rhythm by 02/15/20. 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 The patient's score is based upon: CHF History: Yes HTN History: Yes Diabetes History: Yes Stroke History: No Vascular Disease History: Yes Age Score: 1 Gender Score: 0       For questions or updates, please contact [place] Please consult [email or web address] for contact info under    Signed, Ledora Bottcher, PA  11/19/2020 1:46 PM

## 2020-11-19 NOTE — ED Notes (Signed)
Pt transported to US

## 2020-11-19 NOTE — Progress Notes (Signed)
  Echocardiogram 2D Echocardiogram has been performed.  Michiel Cowboy 11/19/2020, 9:49 AM

## 2020-11-20 ENCOUNTER — Ambulatory Visit: Payer: Medicare PPO | Admitting: Family Medicine

## 2020-11-20 ENCOUNTER — Encounter (HOSPITAL_COMMUNITY): Payer: Self-pay | Admitting: Family Medicine

## 2020-11-20 ENCOUNTER — Other Ambulatory Visit: Payer: Self-pay

## 2020-11-20 DIAGNOSIS — R778 Other specified abnormalities of plasma proteins: Secondary | ICD-10-CM | POA: Diagnosis not present

## 2020-11-20 DIAGNOSIS — E1122 Type 2 diabetes mellitus with diabetic chronic kidney disease: Secondary | ICD-10-CM | POA: Diagnosis not present

## 2020-11-20 DIAGNOSIS — I5023 Acute on chronic systolic (congestive) heart failure: Secondary | ICD-10-CM

## 2020-11-20 DIAGNOSIS — Z86718 Personal history of other venous thrombosis and embolism: Secondary | ICD-10-CM | POA: Diagnosis not present

## 2020-11-20 DIAGNOSIS — E7800 Pure hypercholesterolemia, unspecified: Secondary | ICD-10-CM

## 2020-11-20 DIAGNOSIS — I2583 Coronary atherosclerosis due to lipid rich plaque: Secondary | ICD-10-CM

## 2020-11-20 DIAGNOSIS — I480 Paroxysmal atrial fibrillation: Secondary | ICD-10-CM | POA: Diagnosis not present

## 2020-11-20 DIAGNOSIS — R238 Other skin changes: Secondary | ICD-10-CM | POA: Diagnosis not present

## 2020-11-20 DIAGNOSIS — I2510 Atherosclerotic heart disease of native coronary artery without angina pectoris: Secondary | ICD-10-CM

## 2020-11-20 LAB — PROTIME-INR
INR: 1.4 — ABNORMAL HIGH (ref 0.8–1.2)
Prothrombin Time: 17.1 seconds — ABNORMAL HIGH (ref 11.4–15.2)

## 2020-11-20 LAB — CBC
HCT: 26.5 % — ABNORMAL LOW (ref 39.0–52.0)
Hemoglobin: 8.7 g/dL — ABNORMAL LOW (ref 13.0–17.0)
MCH: 30.5 pg (ref 26.0–34.0)
MCHC: 32.8 g/dL (ref 30.0–36.0)
MCV: 93 fL (ref 80.0–100.0)
Platelets: 251 10*3/uL (ref 150–400)
RBC: 2.85 MIL/uL — ABNORMAL LOW (ref 4.22–5.81)
RDW: 14.2 % (ref 11.5–15.5)
WBC: 7.4 10*3/uL (ref 4.0–10.5)
nRBC: 0 % (ref 0.0–0.2)

## 2020-11-20 LAB — BASIC METABOLIC PANEL
Anion gap: 6 (ref 5–15)
BUN: 38 mg/dL — ABNORMAL HIGH (ref 8–23)
CO2: 24 mmol/L (ref 22–32)
Calcium: 8.4 mg/dL — ABNORMAL LOW (ref 8.9–10.3)
Chloride: 107 mmol/L (ref 98–111)
Creatinine, Ser: 1.32 mg/dL — ABNORMAL HIGH (ref 0.61–1.24)
GFR, Estimated: 59 mL/min — ABNORMAL LOW (ref >60)
Glucose, Bld: 154 mg/dL — ABNORMAL HIGH (ref 70–99)
Potassium: 4.3 mmol/L (ref 3.5–5.1)
Sodium: 137 mmol/L (ref 135–145)

## 2020-11-20 LAB — GLUCOSE, CAPILLARY
Glucose-Capillary: 171 mg/dL — ABNORMAL HIGH (ref 70–99)
Glucose-Capillary: 163 mg/dL — ABNORMAL HIGH (ref 70–99)
Glucose-Capillary: 157 mg/dL — ABNORMAL HIGH (ref 70–99)
Glucose-Capillary: 190 mg/dL — ABNORMAL HIGH (ref 70–99)

## 2020-11-20 LAB — HEPARIN LEVEL (UNFRACTIONATED): Heparin Unfractionated: 0.42 IU/mL (ref 0.30–0.70)

## 2020-11-20 MED ORDER — FUROSEMIDE 10 MG/ML IJ SOLN
40.0000 mg | Freq: Two times a day (BID) | INTRAMUSCULAR | Status: DC
  Administered 2020-11-20 – 2020-11-21 (×3): 40 mg via INTRAVENOUS
  Filled 2020-11-20 (×2): qty 4

## 2020-11-20 MED ORDER — LOSARTAN POTASSIUM 25 MG PO TABS
25.0000 mg | ORAL_TABLET | Freq: Every day | ORAL | Status: DC
  Administered 2020-11-20 – 2020-11-21 (×2): 25 mg via ORAL
  Filled 2020-11-20 (×2): qty 1

## 2020-11-20 MED ORDER — LOSARTAN POTASSIUM 25 MG PO TABS: 12.5000 mg | ORAL_TABLET | Freq: Every day | ORAL | Status: DC

## 2020-11-20 MED ORDER — WARFARIN SODIUM 5 MG PO TABS
10.0000 mg | ORAL_TABLET | Freq: Once | ORAL | Status: AC
  Administered 2020-11-20: 10 mg via ORAL
  Filled 2020-11-20: qty 2

## 2020-11-20 MED ORDER — CARVEDILOL 3.125 MG PO TABS
3.1250 mg | ORAL_TABLET | Freq: Two times a day (BID) | ORAL | Status: DC
  Administered 2020-11-20 – 2020-11-21 (×3): 3.125 mg via ORAL
  Filled 2020-11-20 (×2): qty 1

## 2020-11-20 NOTE — Hospital Course (Addendum)
Rodney [name] is a 70 year old male presenting with left lower extremity edema, and bullae/blisters.  PMH is significant for T2DM, neuropathy, CKD stage III, HFpEF, aortic stenosis, A. fib, hypertension, hyperlipidemia, CAD and PAD.  Right lower extremity bullae/blisters Patient was admitted in the ED a day after discharge from hospitalization following a right great toe amputation.  He said he woke up the next morning noticing bullae and blisters on his right foot. He has had similar episode in April this year.  He denies having any pain or tenderness.  Orthopedics was consulted due to concerns this could be a reaction to his surgery or wound treatment.  Patient was admitted for IV antibiotics due to concerns of cellulitis.  He was started on IV clindamycin, ciprofloxacin and vancomycin in the ED. His antibiotics were stopped as further evaluation showed no signs of cellulitis or infection.  Ortho recommended 20 to 30 mmHg compression socks for patient and also outpatient follow-up.   HFpEF  Fluid overload Patient reported having worsening chronic shortness of breath over the past week since modifications of his blood pressure medication.  He reported PCP decreased his carvedilol and losartan due to soft blood pressures and Lasix was changed to PRN.  His troponin in the ED was elevated at 1462 and BNP 198. Elevated troponin was most likely related to demand ischemia and exacerbated CHF.  Cardiology was consulted and chest x-ray showed new pleural effusion.  Due to concerns of exacerbating CHF, patient was put on strict I's/O's and diuresed with Lasix 40 mg IV BID.   Breathing therapy  Long standing smoker During patient's visit in the ED he received a breathing treatment ska4 SOB and diffuse wheezing on physical exam.  Due to his longstanding history of smoking there were concerns of possible COPD.  Patient was recommended to follow up with PCP for possible COPD diagnosis.   Discharge  recommendations:  PCP to consider outpatient PFTs to confirm likely COPD  Continue high dose Statin, BB, Plavix (ASA switched to plavix for PAD) Per inpatient cardiologist, will need close follow up as outpt with [facility] to review, plan future care Per cardiology, Keep on coreg and amiodarone Patient will need to follow-up with orthopedics as instructed

## 2020-11-20 NOTE — Progress Notes (Signed)
FPTS Brief Progress Note  S Saw patient at bedside this evening. He was in the hallway in the ER. He was requesting a breathing treatment.  O: BP 139/74 (BP Location: Left Arm)   Pulse 78   Temp 98.4 F (36.9 C) (Oral)   Resp 16   Ht 6' (1.829 m)   Wt 110 kg   SpO2 97%   BMI 32.89 kg/m    General: Alert, no acute distress  A/P: Plan per day team -Duonebs Q4PRN   - Orders reviewed. Labs for AM ordered, which was adjusted as needed.   Lattie Haw, MD 11/20/2020, 12:18 AM PGY-3, [facility] Night Resident  Please page [phone] with questions.

## 2020-11-20 NOTE — Progress Notes (Addendum)
Progress Note  Patient Name: Rodney [name] Date of Encounter: 11/20/2020  [facility] Cardiologist: Evalina Field, MD   Subjective   SOB improved.  Denies any chest pain.    Inpatient Medications    Scheduled Meds:  amiodarone  200 mg Oral Daily   atorvastatin  80 mg Oral Daily   carvedilol  6.25 mg Oral BID WC   clopidogrel  75 mg Oral Q breakfast   feeding supplement (NEPRO CARB STEADY)  237 mL Oral BID BM   furosemide  40 mg Intravenous Daily   gabapentin  200 mg Oral TID   insulin aspart  0-15 Units Subcutaneous TID WC   multivitamin  1 tablet Oral QHS   nicotine  14 mg Transdermal Daily   umeclidinium bromide  1 puff Inhalation Daily   Warfarin - Pharmacist Dosing Inpatient   Does not apply q1600   Continuous Infusions:  ceFEPime (MAXIPIME) IV 2 g (11/20/20 0005)   heparin 2,350 Units/hr (11/20/20 0600)   metronidazole 500 mg (11/19/20 2250)   vancomycin 1,750 mg (11/20/20 0041)   PRN Meds: acetaminophen, ipratropium-albuterol, oxyCODONE   Vital Signs    Vitals:   11/19/20 2025 11/19/20 2139 11/19/20 2220 11/20/20 0459  BP: (!) 140/58 137/61 139/74 109/64  Pulse: 76 76 78 68  Resp:  18 16 18   Temp: 98.8 F (37.1 C)  98.4 F (36.9 C) 98 F (36.7 C)  TempSrc: Oral  Oral   SpO2: 96% 95% 97% 92%  Weight:      Height:        Intake/Output Summary (Last 24 hours) at 11/20/2020 0739 Last data filed at 11/20/2020 0600 Gross per 24 hour  Intake 1623.56 ml  Output 450 ml  Net 1173.56 ml   Last 3 Weights 11/18/2020 11/17/2020 11/16/2020  Weight (lbs) 242 lb 8.1 oz 243 lb 13.3 oz 238 lb 15.7 oz  Weight (kg) 110 kg 110.6 kg 108.4 kg      Telemetry    NSR - Personally Reviewed  ECG    No new EKG to review - Personally Reviewed  Physical Exam   GEN: Well nourished, well developed in no acute distress HEENT: Normal NECK: No JVD; No carotid bruits LYMPHATICS: No lymphadenopathy CARDIAC:RRR, no  rubs, gallops.  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 SKIN: Warm and dry NEUROLOGIC:  Alert and oriented x 3 PSYCHIATRIC:  Normal affect   Labs    High Sensitivity Troponin:   Recent Labs  Lab 11/19/20 0358 11/19/20 0558  TROPONINIHS 1,462* 1,154*      Chemistry Recent Labs  Lab 11/14/20 0327 11/15/20 0615 11/18/20 1058 11/19/20 0358 11/20/20 0122  NA 135   < > 135 137 137  K 4.8   < > 4.9 4.8 4.3  CL 105   < > 104 108 107  CO2 23   < > 22 21* 24  GLUCOSE 209*   < > 181* 155* 154*  BUN 38*   < > 39* 42* 38*  CREATININE 1.27*   < > 1.48* 1.47* 1.32*  CALCIUM 8.6*   < > 9.1 8.8* 8.4*  PROT 5.7*  --  6.9 6.0*  --   ALBUMIN 2.4*  --  2.8* 2.4*  --   AST 12*  --  19 20  --   ALT 11  --  12 12  --  ALKPHOS 46  --  56 48  --   BILITOT 0.5  --  0.6 0.5  --   GFRNONAA >60   < > 51* 52* 59*  ANIONGAP 7   < > 9 8 6    < > = values in this interval not displayed.     Hematology Recent Labs  Lab 11/18/20 1058 11/19/20 0358 11/20/20 0122  WBC 10.3 8.7 7.4  RBC 3.35* 3.19* 2.85*  HGB 9.8* 9.5* 8.7*  HCT 31.8* 31.4* 26.5*  MCV 94.9 98.4 93.0  MCH 29.3 29.8 30.5  MCHC 30.8 30.3 32.8  RDW 14.4 14.3 14.2  PLT 299 287 251    BNP Recent Labs  Lab 11/17/20 0115  BNP 198.3*     DDimer No results for input(s): DDIMER in the last 168 hours.   CHA2DS2-VASc Score = 5  This indicates a 7.2% annual risk of stroke. The patient's score is based upon: CHF History: Yes HTN History: Yes Diabetes History: Yes Stroke History: No Vascular Disease History: Yes Age Score: 1 Gender Score: 0   Radiology    DG Chest 2 View  Result Date: 11/18/2020 CLINICAL DATA:  Shortness of breath and productive cough since 11/14/2020. EXAM: CHEST - 2 VIEW COMPARISON:  Single-view of the chest 11/17/2020. FINDINGS: The patient has small bilateral pleural effusions. The patient's left effusion has  decreased since the prior examination. The right effusion is new. There is cardiomegaly and mild interstitial edema. No consolidative process is identified. Aortic atherosclerosis is noted. IMPRESSION: Small right pleural effusion is new since the prior examination. Small left pleural effusion has decreased since the prior study. Cardiomegaly and interstitial edema appear unchanged. Aortic Atherosclerosis (ICD10-I70.0). Electronically Signed   By: Inge Rise M.D.   On: 11/18/2020 11:43   DG Foot Complete Right  Result Date: 11/18/2020 CLINICAL DATA:  Toe amputation last Thursday with foot blisters. EXAM: RIGHT FOOT COMPLETE - 3+ VIEW COMPARISON:  11/07/2020 FINDINGS: Diffuse bandage limiting assessment of soft tissues. There has been interval great toe amputation with unremarkable first metatarsal head. No fracture or dislocation. No acute erosion is seen. There is diffuse osteopenia and arterial calcification. IMPRESSION: 1. Limited by overlapping bandage, especially for detecting soft tissue emphysema. 2. No bony fracture or erosion. Electronically Signed   By: Monte Fantasia M.D.   On: 11/18/2020 11:41   ECHOCARDIOGRAM COMPLETE  Result Date: 11/19/2020    ECHOCARDIOGRAM REPORT   Patient Name:   Rodney [name] Date of Exam: 11/19/2020 Medical Rec #:  [id number]         Height:       72.0 in Accession #:    [id number]        Weight:       242.5 lb Date of Birth:  October 04, 1950        BSA:          2.312 m Patient Age:    70 years          BP:           131/76 mmHg Patient Gender: M                 HR:           80 bpm. Exam Location:  Inpatient Procedure: 2D Echo, Cardiac Doppler, Color Doppler and Intracardiac            Opacification Agent Indications:    Dyspnea R06.00  History:        Patient has  prior history of Echocardiogram examinations, most                 recent 07/30/2020. CHF, CAD, Arrythmias:Atrial Fibrillation; Risk                 Factors:Hypertension, Diabetes, Current Smoker and  Dyslipidemia.                 AS. PAD.  Sonographer:    Vickie Epley RDCS Referring Phys: [id number] CARINA M BROWN IMPRESSIONS  1. Left ventricular ejection fraction, by estimation, is 50 to 55%. The left ventricle has low normal function. The left ventricle has no regional wall motion abnormalities. The left ventricular internal cavity size was mildly dilated. Left ventricular diastolic parameters are consistent with Grade II diastolic dysfunction (pseudonormalization). Elevated left ventricular end-diastolic pressure.  2. Right ventricular systolic function is normal. The right ventricular size is normal. There is normal pulmonary artery systolic pressure.  3. The mitral valve is normal in structure. Trivial mitral valve regurgitation. No evidence of mitral stenosis. Moderate mitral annular calcification.  4. The aortic valve is calcified. There is moderate calcification of the aortic valve. Aortic valve regurgitation is trivial. Moderate aortic valve stenosis. Aortic valve area, by VTI measures 0.73 cm. Aortic valve mean gradient measures 35.0 mmHg. Aortic valve Vmax measures 3.69 m/s.  5. Aortic dilatation noted. There is mild dilatation of the ascending aorta, measuring 39 mm.  6. The inferior vena cava is dilated in size with >50% respiratory variability, suggesting right atrial pressure of 8 mmHg. FINDINGS  Left Ventricle: Left ventricular ejection fraction, by estimation, is 50 to 55%. The left ventricle has low normal function. The left ventricle has no regional wall motion abnormalities. Definity contrast agent was given IV to delineate the left ventricular endocardial borders. The left ventricular internal cavity size was mildly dilated. There is no left ventricular hypertrophy. Left ventricular diastolic parameters are consistent with Grade II diastolic dysfunction (pseudonormalization). Elevated left ventricular end-diastolic pressure. Right Ventricle: The right ventricular size is normal. No increase in  right ventricular wall thickness. Right ventricular systolic function is normal. There is normal pulmonary artery systolic pressure. The tricuspid regurgitant velocity is 1.57 m/s, and  with an assumed right atrial pressure of 8 mmHg, the estimated right ventricular systolic pressure is 56.3 mmHg. Left Atrium: Left atrial size was normal in size. Right Atrium: Right atrial size was normal in size. Pericardium: There is no evidence of pericardial effusion. Mitral Valve: The mitral valve is normal in structure. Moderate mitral annular calcification. Trivial mitral valve regurgitation. No evidence of mitral valve stenosis. Tricuspid Valve: The tricuspid valve is normal in structure. Tricuspid valve regurgitation is trivial. No evidence of tricuspid stenosis. Aortic Valve: The aortic valve is calcified. There is moderate calcification of the aortic valve. Aortic valve regurgitation is trivial. Moderate aortic stenosis is present. Aortic valve mean gradient measures 35.0 mmHg. Aortic valve peak gradient measures 54.5 mmHg. Aortic valve area, by VTI measures 0.73 cm. Pulmonic Valve: The pulmonic valve was normal in structure. Pulmonic valve regurgitation is not visualized. No evidence of pulmonic stenosis. Aorta: Aortic dilatation noted. There is mild dilatation of the ascending aorta, measuring 39 mm. Venous: The inferior vena cava is dilated in size with greater than 50% respiratory variability, suggesting right atrial pressure of 8 mmHg. IAS/Shunts: No atrial level shunt detected by color flow Doppler.  LEFT VENTRICLE PLAX 2D LVIDd:         5.90 cm      Diastology LVIDs:  4.30 cm      LV e' medial:    5.84 cm/s LV PW:         1.00 cm      LV E/e' medial:  21.2 LV IVS:        1.00 cm      LV e' lateral:   8.27 cm/s LVOT diam:     2.20 cm      LV E/e' lateral: 15.0 LV SV:         66 LV SV Index:   28 LVOT Area:     3.80 cm  LV Volumes (MOD) LV vol d, MOD A2C: 152.0 ml LV vol d, MOD A4C: 174.0 ml LV vol s, MOD  A2C: 62.1 ml LV vol s, MOD A4C: 68.2 ml LV SV MOD A2C:     89.9 ml LV SV MOD A4C:     174.0 ml LV SV MOD BP:      98.0 ml RIGHT VENTRICLE RV S prime:     14.80 cm/s TAPSE (M-mode): 2.6 cm LEFT ATRIUM             Index       RIGHT ATRIUM           Index LA diam:        5.50 cm 2.38 cm/m  RA Area:     20.20 cm LA Vol (A2C):   64.8 ml 28.02 ml/m RA Volume:   59.50 ml  25.73 ml/m LA Vol (A4C):   65.5 ml 28.33 ml/m LA Biplane Vol: 66.4 ml 28.71 ml/m  AORTIC VALVE AV Area (Vmax):    0.81 cm AV Area (Vmean):   0.73 cm AV Area (VTI):     0.73 cm AV Vmax:           369.00 cm/s AV Vmean:          275.500 cm/s AV VTI:            0.900 m AV Peak Grad:      54.5 mmHg AV Mean Grad:      35.0 mmHg LVOT Vmax:         78.30 cm/s LVOT Vmean:        53.000 cm/s LVOT VTI:          0.173 m LVOT/AV VTI ratio: 0.19  AORTA Ao Root diam: 3.50 cm Ao Asc diam:  3.90 cm MITRAL VALVE                TRICUSPID VALVE MV Area (PHT): 4.60 cm     TR Peak grad:   9.9 mmHg MV Decel Time: 165 msec     TR Vmax:        157.00 cm/s MR Peak grad: 112.4 mmHg MR Vmax:      530.00 cm/s   SHUNTS MV E velocity: 124.00 cm/s  Systemic VTI:  0.17 m MV A velocity: 66.10 cm/s   Systemic Diam: 2.20 cm MV E/A ratio:  1.88 Skeet Latch MD Electronically signed by Skeet Latch MD Signature Date/Time: 11/19/2020/12:04:39 PM    Final    VAS [place] LOWER EXTREMITY VENOUS (DVT)  Result Date: 11/19/2020  Lower Venous DVT Study Patient Name:  ORBY TANGEN  Date of Exam:   11/19/2020 Medical Rec #: [id number]          Accession #:    [id number] Date of Birth: July 04, 1950         Patient Gender: M Patient Age:   81Y Exam  Location:  [facility] Procedure:      VAS [place] LOWER EXTREMITY VENOUS (DVT) Referring Phys: Vona --------------------------------------------------------------------------------  Indications: Swelling.  Risk Factors: Surgery 11/15/2020 - RIGHT GREAT TOE AMPUTATION 11/14/2020 - PERIPHERAL VASCULAR INTERVENTION.  Limitations: Poor ultrasound/tissue interface and open wound. Comparison Study: Extensive arterial history. 11-10-2020 Lower extremity arterial                   duplex RT showed patent bypass graft with monophasic flow                   throughout and distally. Large area containing mixed echoes,                   measuring 4.42 X 7.88 noted in the distal thigh and proximal                   popliteal fossa. Performing Technologist: Oliver Hum RVT  Examination Guidelines: A complete evaluation includes B-mode imaging, spectral Doppler, color Doppler, and power Doppler as needed of all accessible portions of each vessel. Bilateral testing is considered an integral part of a complete examination. Limited examinations for reoccurring indications may be performed as noted. The reflux portion of the exam is performed with the patient in reverse Trendelenburg.  +---------+---------------+---------+-----------+----------+-------------------+ RIGHT    CompressibilityPhasicitySpontaneityPropertiesThrombus Aging      +---------+---------------+---------+-----------+----------+-------------------+ CFV      Full           Yes      Yes                                      +---------+---------------+---------+-----------+----------+-------------------+ SFJ      Full                                                             +---------+---------------+---------+-----------+----------+-------------------+ FV Prox  Full                                                             +---------+---------------+---------+-----------+----------+-------------------+ FV Mid   Full                                                             +---------+---------------+---------+-----------+----------+-------------------+ FV Distal               Yes      Yes                                      +---------+---------------+---------+-----------+----------+-------------------+ PFV      Full                                                              +---------+---------------+---------+-----------+----------+-------------------+  POP      Full           Yes      Yes                                      +---------+---------------+---------+-----------+----------+-------------------+ PTV      Full                                                             +---------+---------------+---------+-----------+----------+-------------------+ PERO                                                  Not well visualized +---------+---------------+---------+-----------+----------+-------------------+   +----+---------------+---------+-----------+----------+--------------+ LEFTCompressibilityPhasicitySpontaneityPropertiesThrombus Aging +----+---------------+---------+-----------+----------+--------------+ CFV                Yes      Yes                                 +----+---------------+---------+-----------+----------+--------------+     Summary: RIGHT: - There is no evidence of deep vein thrombosis in the lower extremity. However, portions of this examination were limited- see technologist comments above.  - Extensive fluid noted in the popliteal fossa surrounding the bypass graft. Fluid also noted on previous arterial examination.   *See table(s) above for measurements and observations. Electronically signed by Deitra Mayo MD on 11/19/2020 at 5:57:06 PM.    Final     Cardiac Studies   Echo 11/19/20: 1. Left ventricular ejection fraction, by estimation, is 50 to 55%. The  left ventricle has low normal function. The left ventricle has no regional  wall motion abnormalities. The left ventricular internal cavity size was  mildly dilated. Left ventricular  diastolic parameters are consistent with Grade II diastolic dysfunction  (pseudonormalization). Elevated left ventricular end-diastolic pressure.   2. Right ventricular systolic function is normal. The right  ventricular  size is normal. There is normal pulmonary artery systolic pressure.   3. The mitral valve is normal in structure. Trivial mitral valve  regurgitation. No evidence of mitral stenosis. Moderate mitral annular  calcification.   4. The aortic valve is calcified. There is moderate calcification of the  aortic valve. Aortic valve regurgitation is trivial. Moderate aortic valve  stenosis. Aortic valve area, by VTI measures 0.73 cm. Aortic valve mean  gradient measures 35.0 mmHg.  Aortic valve Vmax measures 3.69 m/s.   5. Aortic dilatation noted. There is mild dilatation of the ascending  aorta, measuring 39 mm.   6. The inferior vena cava is dilated in size with >50% respiratory  variability, suggesting right atrial pressure of 8 mmHg. Patient Profile     70 y.o. 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 Ardelia Mems.  Assessment & Plan    Elevated troponin - hs troponin 1462 --> 1154 - EKG with ST depression inferior and lateral leads -  appears slightly more exaggerated compared to prior tracings - pt specifically DENIES chest pain - 2D echo this admit actually shows improved LVF with EF 50-55% with no focal wall motion abnormalities - elevated and flat hsTrop c/w demand ischemia in the setting of cellulitis and possible mild CHF exacerbation from being taken off HF meds by PCP last week as well as possible COPD exacerbation - cath a year ago showed occluded proximal RCA with L>R collaterals and no other CAD - no ischemic workup at this time   Orthopnea, lower extremity swelling 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 - echo in 09/2019 with EF 35-40% - maintained on low dose coreg and losartan - recently taken off of spironolactone and daily lasix, coreg and losartan doses reduced --> GDMT has been  challenging due to marginal pressure - repeat echo now shows improvement in EF to 50-55%, garde 2 DD, and continued moderate AS - BNP on admission was 200 - difficult to know if his symptoms are true orthopnea or related to his recent smoking cessation and increased cough - symptoms are improved with is inhalers and he thinks it is related to his COPD - CXR with small bilateral pleural effusions - BP improved yesterday but this am soft at 109/34mmHg - given Lasix 40mg  IV yesterday and put out only 450cc (? Accuracy) and 1.1L+ - SCr improved from 1.53 to 1.32 today with diuresis - decrease carvedilol to 3.125mg  BID and restart Losartan at lower dose of 25mg  daily and titrate meds as BP allows - I think he is still volume overloaded so I will give Lasix 40mg  IV BID today and reassess in am   PAF Chronic anticoagulation with coumadin This patients CHA2DS2-VASc Score and unadjusted Ischemic Stroke Rate (% per year) is equal to 7.2 % stroke rate/year from a score of 5 (age, CAD, HTN, DM, CHF) Developed post-op following 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 Successful TEE-DCCV 09/2019 but reverted back to Afib 11/03/19. Maintained on amiodarone, converted to sinus rhythm by 02/15/20. 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  > 50% of time was spent in direct patient care.          For questions or updates, please contact [place] Please consult [email or web address] for contact info under        Signed, Fransico Him, MD  11/20/2020, 7:39 AM

## 2020-11-20 NOTE — Progress Notes (Addendum)
FPTS Brief Progress Note  S: Pt was resting comfortably in bed.  States he is in no pain and that the Lasix appear to be working as he said he is urinating large amounts very frequently.  He requested a DuoNeb before going to sleep.    O: BP 125/79 (BP Location: Left Arm)   Pulse 69   Temp 98.6 F (37 C) (Oral)   Resp 17   Ht 6' (1.829 m)   Wt 110 kg   SpO2 95%   BMI 32.89 kg/m   General: lying in bed, NAD Respiratory: breathing comfortably on RA.  Occasional productive cough  A/P: Continue to follow plan outlined in day teams progress note - Orders reviewed. Labs for AM ordered, which was adjusted as needed.    Precious Gilding, DO 11/20/2020, 11:42 PM PGY-1, [facility] Night Resident  Please page [phone] with questions.

## 2020-11-20 NOTE — Progress Notes (Signed)
ANTICOAGULATION CONSULT NOTE - Initial Consult  Pharmacy Consult for heparin and coumadin Indication: atrial fibrillation  No Known Allergies  Patient Measurements: Height: 6' (182.9 cm) Weight: 110 kg (242 lb 8.1 oz) IBW/kg (Calculated) : 77.6 Heparin Dosing Weight: 100.9 kg  Vital Signs: Temp: 97.9 F (36.6 C) (07/27 0803) Temp Source: Oral (07/27 0803) BP: 133/72 (07/27 0803) Pulse Rate: 70 (07/27 0803)  Labs: Recent Labs    11/18/20 1058 11/18/20 1058 11/19/20 0358 11/19/20 0558 11/19/20 1450 11/19/20 2310 11/20/20 0122  HGB 9.8*  --  9.5*  --   --   --  8.7*  HCT 31.8*  --  31.4*  --   --   --  26.5*  PLT 299  --  287  --   --   --  251  LABPROT  --   --  16.3*  --   --   --  17.1*  INR  --   --  1.3*  --   --   --  1.4*  HEPARINUNFRC  --    < > 0.12*  --  0.26* 0.47 0.42  CREATININE 1.48*  --  1.47*  --   --   --  1.32*  TROPONINIHS  --   --  1,462* 1,154*  --   --   --    < > = values in this interval not displayed.     Estimated Creatinine Clearance: 68.6 mL/min (A) (by C-G formula based on SCr of 1.32 mg/dL (H)).   Medical History: Past Medical History:  Diagnosis Date   Aortic stenosis    moderate in 2022   Atrial fibrillation (HCC)    CHF (congestive heart failure) (HCC)    Coronary artery disease    Diabetes mellitus without complication (HCC)    HLD (hyperlipidemia)    Hypertension    Peripheral arterial disease [facility])     Assessment: 70 yo man on warfarin for afib and hx DVT April 2022, now being held for possible procedure for RLE wounds. Admit INR 1.3.  Pharmacy consulted for heparin and warfarin.   HL therapeutic at 0.42, on heparin drip at 2350 units/hr.   INR has been subtherapeutic since 7/16 due to holding for procedure. INR today 1.4.   Warfarin PTA dose starting 7/24: 7.5mg  x1 then 5mg  daily with enoxaparin 100mg  Q12 hr x 7 day bridge but patient reported did not start taking enoxaparin yet since was readmitted on  7/25.  Patient is currently on metronidazole. Warfarin dose should be adjusted appropriately, depending on abx continuation.   Goal of Therapy:  INR 2-3 Heparin level 0.3-0.7 units/ml Monitor platelets by anticoagulation protocol: Yes   Plan:  Continue heparin drip at 2350 units/hr  If continuing metronidazole, then give warfarin 6mg  x 1 If discontinuing metronidazole, then give warfarin 10mg  x 1 Monitor daily INR, HL, CBC/plt Monitor for signs/symptoms of bleeding   Vance Peper, PharmD PGY1 Resident 11/20/2020 8:35 AM    Please check AMION for all [place] phone numbers After 10:00 PM, call [place] [phone]

## 2020-11-20 NOTE — Progress Notes (Signed)
[place] for IV Heparin Indication: atrial fibrillation  Labs: Recent Labs    11/17/20 0115 11/17/20 0942 11/18/20 1058 11/19/20 0358 11/19/20 0558 11/19/20 1450 11/19/20 2310  HGB 8.7* 9.6* 9.8* 9.5*  --   --   --   HCT 27.2* 30.0* 31.8* 31.4*  --   --   --   PLT 200  --  299 287  --   --   --   LABPROT 16.1*  --   --  16.3*  --   --   --   INR 1.3*  --   --  1.3*  --   --   --   HEPARINUNFRC 0.59  --   --  0.12*  --  0.26* 0.47  CREATININE 1.53*  --  1.48* 1.47*  --   --   --   TROPONINIHS  --   --   --  1,462* 1,154*  --   --    Assessment: 70 yr old man on warfarin for afib and hx DVT in April 2022; warfarin now being held for possible procedure for RLE wounds. Admit INR 1.3.  Pharmacy was consulted for heparin and warfarin dosing. Patient was recently therapeutic on heparin infusion at 2150 units/hr.   Heparin level 0.47 units/ml  Goal of Therapy:  INR 2-3 Heparin level 0.3-0.7 units/ml Monitor platelets by anticoagulation protocol: Yes   Plan:  Continue heparin infusion at 2350 units/ml Monitor daily INR, heparin level, CBC Monitor for signs/symptoms of bleeding   Thanks for allowing pharmacy to be a part of this patient's care.  Excell Seltzer, PharmD Clinical Pharmacist

## 2020-11-20 NOTE — Evaluation (Signed)
Physical Therapy Evaluation Patient Details Name: Rodney [name] MRN: [id number] DOB: 1950-10-16 Today's Date: 11/20/2020   History of Present Illness  Rodney [name] is a 70 y.o. male who presented 7/25 with RLE bullae, blister, edema and erythema. He was recently admitted for R great toe amputation on 7/22, TWB L heel only, discharged 7/24.  PMH includes PVD s/p femoropopliteal bypass with complication of cellulitis, CAD, diabetes mellitus, chronic kidney disease, hypertension, and afib.  Clinical Impression   Pt presents with impaired safety awareness, increased time and effort to perform mobility tasks, decreased knowledge and application of safe transfer practices, and decreased activity tolerance. Pt to benefit from acute PT to address deficits. Pt overall requiring close guard to light assist for bed mobility and EOB scooting today in preparation for w/c transfer. Pt declined OOB transfer today, agreeable tomorrow before d/c for safest transfer practice. Pt states he has been WB through post-op RLE for transfers at home, PT verbally reviewed alternative methods and pt expresses understanding. PT recommending HHPT at d/c for safe mobiity at home. PT to progress mobility as tolerated, and will continue to follow acutely.      Follow Up Recommendations Home health PT;Supervision/Assistance - 24 hour    Equipment Recommendations  None recommended by PT    Recommendations for Other Services       Precautions / Restrictions Precautions Precautions: Fall Required Braces or Orthoses: Other Brace Other Brace: has L prosthesis, left at home during inpt stay Restrictions Weight Bearing Restrictions: Yes RLE Weight Bearing: Touchdown weight bearing Other Position/Activity Restrictions: on heel; with postop shoe      Mobility  Bed Mobility Overal bed mobility: Needs Assistance Bed Mobility: Supine to Sit;Sit to Supine;Rolling Rolling: Supervision   Supine to sit: Min  assist Sit to supine: Min guard   General bed mobility comments: min guard to min assist for supine<>sit from very elevated HOB position for trunk elevation off of bed via HHA, increased time and use of bedrails. Rolling bilat with supervision level assist with bedrails, to change bedding.    Transfers Overall transfer level: Needs assistance   Transfers: Lateral/Scoot Transfers          Lateral/Scoot Transfers: Supervision General transfer comment: scooting x4 towards [facility] with PT prompting, to simulate scoot transfer OOB to recliner. Supervision for safety, cuing for no more than TDWB through heel RLE, pt performing with UE only.  Ambulation/Gait             General Gait Details: No prosthesis in room  Stairs            Wheelchair Mobility    Modified Rankin (Stroke Patients Only)       Balance Overall balance assessment: Needs assistance Sitting-balance support: No upper extremity supported;Feet unsupported Sitting balance-Leahy Scale: Good         Standing balance comment: NT                             Pertinent Vitals/Pain Pain Assessment: No/denies pain Pain Intervention(s): Monitored during session    Home Living Family/patient expects to be discharged to:: Private residence Living Arrangements: Spouse/significant other Available Help at Discharge: Friend(s) Type of Home: Mobile home Home Access: Ramped entrance     Home Layout: One level Home Equipment: Environmental consultant - 2 wheels;Bedside commode;Tub bench;Wheelchair - manual Additional Comments: Pt typically sleeps and functions in living room (uses [facility]) and transfers to [facility] and back  Prior Function Level of Independence: Independent with assistive device(s)         Comments: Pt confirms still accurate from Previous admission: Pt typically transfers independently to [facility] and BSC (his fiance has to hold the [facility] because it will rock - but Pt does not need help physically to transfer. He  does not drive anymore (girlfriend DOES help with vehicle transfers) but patient bathes and dresses on his own and is participating in PT working on ambulation with prosthesis; Tells me he has supplemental O2 at home, uses it prn, a few times a month usually     Hand Dominance   Dominant Hand: Right    Extremity/Trunk Assessment   Upper Extremity Assessment Upper Extremity Assessment: Defer to OT evaluation    Lower Extremity Assessment Lower Extremity Assessment: RLE deficits/detail;LLE deficits/detail RLE Deficits / Details: s/p R great toe amputation; able to perform DF/PF, SLR, knee flex/ext full AROM. Pt's lower leg ace wrapped secondary to blistering LLE Deficits / Details: chronic BKA; can perform quad set, SLR, hip abd/add without assist    Cervical / Trunk Assessment Cervical / Trunk Assessment: Normal  Communication   Communication: HOH  Cognition Arousal/Alertness: Awake/alert Behavior During Therapy: WFL for tasks assessed/performed Overall Cognitive Status: Within Functional Limits for tasks assessed                                 General Comments: Pt with very dry, sarcastic sense of humor; more agreeable to therapies when diffused with humor.      General Comments General comments (skin integrity, edema, etc.): VSS on RA - pt extremely agitated throughout session and focused on discharge    Exercises Other Exercises Other Exercises: Pt declining OOB to drop arm recliner or w/c today, so PT discussed safe transfer given precautions s/p R great toe amputation. PT encouraged the following: scoot pivot to drop arm w/c, w/c placed perpendicular to to pt, heel TDWB with RLE and LLE prosthesis donned for transfers to maintain new post-op precautions, sequential scooting to reach w/c. Pt states "that's not how is comfortable for me" but open to practicing technique tomorrow.   Assessment/Plan    PT Assessment Patient needs continued PT services  PT  Problem List Decreased activity tolerance;Decreased knowledge of use of DME;Decreased knowledge of precautions;Decreased mobility;Decreased strength;Decreased safety awareness;Obesity;Decreased skin integrity;Pain       PT Treatment Interventions DME instruction;Functional mobility training;Therapeutic activities;Therapeutic exercise;Balance training;Patient/family education;Wheelchair mobility training    PT Goals (Current goals can be found in the Care Plan section)  Acute Rehab PT Goals Patient Stated Goal: go home PT Goal Formulation: With patient Time For Goal Achievement: 12/04/20 Potential to Achieve Goals: Good    Frequency Min 3X/week   Barriers to discharge        Co-evaluation               AM-PAC PT "6 Clicks" Mobility  Outcome Measure Help needed turning from your back to your side while in a flat bed without using bedrails?: A Little Help needed moving from lying on your back to sitting on the side of a flat bed without using bedrails?: A Little Help needed moving to and from a bed to a chair (including a wheelchair)?: A Little Help needed standing up from a chair using your arms (e.g., wheelchair or bedside chair)?: A Lot Help needed to walk in hospital room?: A Lot Help needed climbing 3-5 steps  with a railing? : A Lot 6 Click Score: 15    End of Session   Activity Tolerance: Patient tolerated treatment well Patient left: in bed;with call bell/phone within reach;with bed alarm set;with family/visitor present Nurse Communication: Mobility status PT Visit Diagnosis: Other abnormalities of gait and mobility (R26.89)    Time: 1340-1402 PT Time Calculation (min) (ACUTE ONLY): 22 min   Charges:   PT Evaluation $PT Eval Low Complexity: 1 Low         Admire Bunnell S, PT DPT Acute Rehabilitation Services Pager [phone]  Office [phone]   Roxine Caddy E Ruffin Pyo 11/20/2020, 2:51 PM

## 2020-11-20 NOTE — Progress Notes (Addendum)
Family Medicine Teaching Service Daily Progress Note Intern Pager: [phone]  Patient name: Rodney [name] Medical record number: [id number] Date of birth: 1950-09-01 Age: 70 y.o. Gender: male  Primary Care Provider: [name], MD Consultants: ortho Code Status: Full  Pt Overview and Major Events to Date:  7/25 admitted  Assessment and Plan: [name] is a 70year old who presented with RLE bullae, blister, edema and erythema. PMH is significant for T2DM, CAD, PAD, HFpEF, A.Fib, Aorthic stenosis, CKD III, HTN and HLD  Right Lower Extremity Swelling with Bullae/Blisters Patient was admitted with right leg bullae/blisters with erythema. He denies any pain, fever or chills. Per Ortho there is no concern for cellulites or infection at this time. -ortho following, appreciate recs -Stopped abx of Vancomycin, Cefepime, metronidazole -PT/OT -Tylenol 650 mg PRN -Oxycodone 5 mg q8h PRN   HFpEF  Aortic Stenosis Cardiology following, appreciate rec -continue cardiac monitoring -continue carvedilol 3.125 mg BID -Started Lasix IV 40 mg BID per Cardio  High Troponin Last troponin was 1154 yesterday -cardiology following, appreciate recs -continue cardiac monitoring  CKD III Creatnine this morning was -monitor daily BMP -Avoid nephrotoxic agents  Hperlipidemia  PAD  CAD Home medication includes Plavix 75 mg daily and Artovostatin 80 mg daily -continue atorvastatin 80 mg daily -continue Plavix 75 mg daily  Anemia of chronic disease  Hgb this morning is 8.7 -Threshold to transfuse is 8 -Continue to monitor CBC  FEN/GI: Heart Health diet PPx: Wafarin bridging with heparin post op Dispo:Home pending clinical improvement . Barriers include clinical status.   Subjective:  Patient said he is doing fine and denies any pain, fever or chills.  Objective: Temp:  [98 F (36.7 C)-98.8 F (37.1 C)] 98 F (36.7 C) (07/27 0459) Pulse Rate:  [68-83] 68 (07/27 0459) Resp:   [16-20] 18 (07/27 0459) BP: (109-140)/(58-88) 109/64 (07/27 0459) SpO2:  [92 %-97 %] 92 % (07/27 0459) Physical Exam: General: Awake, siting in bed, upset Cardiovascular: RRR, 2/6 systolic murmur Respiratory: diffused wheezing Abdomen: No abdominal distension or tenderness Extremities: Left foot is amputated and right foot is wrapped with no visible drainage  Laboratory: Recent Labs  Lab 11/18/20 1058 11/19/20 0358 11/20/20 0122  WBC 10.3 8.7 7.4  HGB 9.8* 9.5* 8.7*  HCT 31.8* 31.4* 26.5*  PLT 299 287 251   Recent Labs  Lab 11/14/20 0327 11/15/20 0615 11/18/20 1058 11/19/20 0358 11/20/20 0122  NA 135   < > 135 137 137  K 4.8   < > 4.9 4.8 4.3  CL 105   < > 104 108 107  CO2 23   < > 22 21* 24  BUN 38*   < > 39* 42* 38*  CREATININE 1.27*   < > 1.48* 1.47* 1.32*  CALCIUM 8.6*   < > 9.1 8.8* 8.4*  PROT 5.7*  --  6.9 6.0*  --   BILITOT 0.5  --  0.6 0.5  --   ALKPHOS 46  --  56 48  --   ALT 11  --  12 12  --   AST 12*  --  19 20  --   GLUCOSE 209*   < > 181* 155* 154*   < > = values in this interval not displayed.    Imaging/Diagnostic Tests: No image studies   Alen Bleacher, MD 11/20/2020, 6:26 AM PGY-1, [place] Intern pager: [phone], text pages welcome

## 2020-11-20 NOTE — Discharge Instructions (Addendum)
Dear Rodney [name],  Thank you for letting us participate in your care. You were hospitalized for 3 days and diagnosed with Cellulitis. You were treated with Antibiotics, Lasix and Nebulizer.   POST-HOSPITAL & CARE INSTRUCTIONS Call the orthopedics office ASAP and make a follow up appointment with Rodney [name] and associates for early next week (Mon 8/1 - Tues 8/2) Follow Up with your PCP for Nebulizer and breathing treatment  Follow up with nurse only visit twice Monday and Thursday before you see your PCP Please change your warfarin dose to 7.5mg  Monday/Wednesday/Friday and 5mg  Tuesday/Thursday/Saturday/Sunday (starting Friday July 29). Please get your INR checked at the [facility] Medicine clinic on Monday August 1.  Go to your follow up appointments (listed below)   DOCTOR'S APPOINTMENT   Future Appointments  Date Time Provider [place]  11/25/2020  9:30 AM [facility] NURSE [facility] [place]  11/28/2020  9:30 AM [facility] NURSE [facility] [place]  12/02/2020 10:50 AM Rodney Resides, Rodney [name] [facility] [place]  12/17/2020 11:00 AM MC-CV HS VASC 5 - JP [facility]-H[facility]I VVS  12/17/2020 11:30 AM [facility]-CV HS VASC 5 - JP MC-HCVI VVS  12/17/2020 11:45 AM [facility] PA [facility] VVS  02/28/2021  8:40 AM Rodney [name], Rodney Freer, Rodney [name] [facility]    Follow-up Information     Rodney Resides, Rodney [name]. Go on 12/02/2020.   Specialty: Family Medicine Why: At 10:50am. Please arrive by 10:35am. This is your hospital follow up with your primary care doctor, Dr. Andria Frames. If this day time does not work for you, please call the clinic directly to reschedule. Contact information: [place] [address] [phone]         Rodney Rile, Rodney [name] .   Specialties: Cardiology, Internal Medicine, Radiology Contact information: [place] [address] [place]. Go on 11/25/2020.   Specialty: Family Medicine Why: At 9:30am. Please arrive by  9:15am. This will be a nurse-only appointment before you go to the lab for an INR check (warfarin check).The nurse will evaluate your right leg wrap and may re-do it. Contact information: [address] [id number] [place]. Go on 11/28/2020.   Specialty: Family Medicine Why: At 9:30am. Please arrive by 9:15am.This is a nurse-only visit. The nurse will evaluate your right leg wrap and may re-do it. Contact information: [address] [id number] [place] [phone]                Take care and be well!  [facility]  [place], [place] [address] [phone]

## 2020-11-20 NOTE — Progress Notes (Addendum)
Occupational Therapy Evaluation Patient Details Name: Rodney [name] MRN: [id number] DOB: 1951/01/07 Today's Date: 11/20/2020    History of Present Illness Rodney [name] is a 70 y.o. male who presented 7/25 with RLE bullae, blister, edema and erythema. He was recently admitted for R great toe amputation on 7/22, TWB L heel only. Discharged 7/24.  Pt with history of peripheral vascular disease status post femoropopliteal bypass in April 2022 following which patient also was admitted the same month for cellulitis of the right lower extremity treated with antibiotics with history of CAD, diabetes mellitus, chronic kidney disease, hypertension and A. fib on Coumadin   Clinical Impression   [name] was evaluated s/p the above re-admission. He admits that he has been using his RLE to transfer surfaces and demonstrated poor insight into his deficits and safety. Pt was agitated and focused on discharge to home. Therapist educated pt on alternative transfer methods to maintain TWB status, however pt refused to attempt. Pt unwilling to participate further in the OT evaluation beyond PLOF, home set up and education of transfer. Recommend d/c to home with home health services if pt is accepting.      Follow Up Recommendations  Home health OT;Supervision - Intermittent    Equipment Recommendations  None recommended by OT       Precautions / Restrictions Precautions Precautions: Fall Required Braces or Orthoses: Other Brace Other Brace: has L prosthesis, left at home during inpt stay Restrictions Weight Bearing Restrictions: Yes RLE Weight Bearing: Touchdown weight bearing Other Position/Activity Restrictions: on heel; with postop shoe      Mobility Bed Mobility Overal bed mobility: Needs Assistance Bed Mobility: Supine to Sit     Supine to sit: Modified independent (Device/Increase time)     General bed mobility comments: pt refused to transfer EOB, reporting that he does not have  any trouble with that task    Transfers Overall transfer level: Needs assistance               General transfer comment: deferred due to pt not having L prosthetic and refused to attempt lateral scoot transfer    Balance Overall balance assessment: Needs assistance             ADL either performed or assessed with clinical judgement   ADL Overall ADL's : Needs assistance/impaired Eating/Feeding: Independent;Sitting   Grooming: Set up;Sitting   Upper Body Bathing: Set up;Sitting   Lower Body Bathing: Min guard;Sitting/lateral leans   Upper Body Dressing : Set up;Sitting   Lower Body Dressing: Minimal assistance;Sitting/lateral leans   Toilet Transfer: Minimal assistance;BSC;Squat-pivot Toilet Transfer Details (indicate cue type and reason): Min A towards L with prothestic, or lateral scoot transfer Toileting- Clothing Manipulation and Hygiene: Supervision/safety       Functional mobility during ADLs:  (pt refused) General ADL Comments: pt seemingly close to baseline for ADLs at wc level. Pt admitted to completing stand pivot transfers on RLE and not maintaining TWB, pt refused to practice lateral scoot transfers. However agreeable to attempt transfers towards the L with prothestic donned      Pertinent Vitals/Pain Pain Assessment: No/denies pain Pain Intervention(s): Monitored during session     Hand Dominance     Extremity/Trunk Assessment Upper Extremity Assessment Upper Extremity Assessment: Overall WFL for tasks assessed   Lower Extremity Assessment Lower Extremity Assessment: Defer to PT evaluation   Cervical / Trunk Assessment Cervical / Trunk Assessment: Normal   Communication Communication Communication: HOH   Cognition Arousal/Alertness: Awake/alert Behavior During  Therapy: WFL for tasks assessed/performed;Agitated Overall Cognitive Status: No family/caregiver present to determine baseline cognitive functioning              General  Comments: focused on discharging. not interested in therapy services at all, immediately declining upon arrival stating "I dont need no help." Pt has poor insight into his deficits, safety awareness and problem solving. He reported that he has been transferring on his RLE, and refused to attempt a later transfer to adhere to [facility] precautions.   General Comments  VSS on RA - pt extremely agitated throughout session and focused on discharge     [place] expects to be discharged to:: Private residence Living Arrangements: Spouse/significant other Available Help at Discharge: Friend(s) Type of Home: Mobile home Home Access: [place]: One level     Bathroom Shower/Tub: Teacher, early years/pre: [place]: Environmental consultant - 2 wheels;Bedside commode;Tub bench;Wheelchair - manual   Additional Comments: Pt typically sleeps and functions in living room (uses BSC) and transfers to [facility] and back      Prior Functioning/Environment Level of Independence: Independent with assistive device(s)        Comments: Pt confirms still accurate from Previous admission: Pt typically transfers independently to [facility] and BSC (his fiance has to hold the [facility] because it will rock - but Pt does not need help physically to transfer. He does not drive anymore (girlfriend DOES help with vehicle transfers) but patient bathes and dresses on his own and is participating in PT working on ambulation with prosthesis; Tells me he has supplemental O2 at home, uses it prn, a few times a month usually        OT Problem List: Decreased strength;Decreased range of motion;Decreased activity tolerance;Decreased safety awareness;Impaired UE functional use      OT Treatment/Interventions: Self-care/ADL training;Therapeutic exercise;Therapeutic activities;Patient/family education;Balance training;DME and/or AE instruction    OT Goals(Current goals can be found in the care  plan section) Acute Rehab OT Goals Patient Stated Goal: go home OT Goal Formulation: With patient ADL Goals Pt Will Transfer to Toilet: with min guard assist;squat pivot transfer;bedside commode Additional ADL Goal #1: Pt will complete funcitonal transfers while maintaining RLE TWB to progress indep in ADLs at wc level  OT Frequency: Min 2X/week    AM-PAC OT "6 Clicks" Daily Activity     Outcome Measure Help from another person eating meals?: None Help from another person taking care of personal grooming?: A Little Help from another person toileting, which includes using toliet, bedpan, or urinal?: A Little Help from another person bathing (including washing, rinsing, drying)?: A Little Help from another person to put on and taking off regular upper body clothing?: None Help from another person to put on and taking off regular lower body clothing?: A Little 6 Click Score: 20   End of Session Nurse Communication: Mobility status;Weight bearing status;Precautions;Other (comment) (Pt likely not to adhere to WB status)  Activity Tolerance: Treatment limited secondary to agitation;Patient tolerated treatment well Patient left: in bed;with call bell/phone within reach;with bed alarm set;with family/visitor present  OT Visit Diagnosis: Unsteadiness on feet (R26.81);Other abnormalities of gait and mobility (R26.89);Pain                Time: 1203-1220 OT Time Calculation (min): 17 min Charges:  OT General Charges $OT Visit: 1 Visit OT Evaluation $OT Eval Low Complexity: 1 Low    Anika Shore A Christy Friede 11/20/2020,  1:33 PM

## 2020-11-21 ENCOUNTER — Other Ambulatory Visit (HOSPITAL_COMMUNITY): Payer: Self-pay

## 2020-11-21 DIAGNOSIS — R238 Other skin changes: Secondary | ICD-10-CM | POA: Diagnosis not present

## 2020-11-21 DIAGNOSIS — I5043 Acute on chronic combined systolic (congestive) and diastolic (congestive) heart failure: Secondary | ICD-10-CM | POA: Diagnosis not present

## 2020-11-21 DIAGNOSIS — I480 Paroxysmal atrial fibrillation: Secondary | ICD-10-CM | POA: Diagnosis not present

## 2020-11-21 LAB — CBC
HCT: 27 % — ABNORMAL LOW (ref 39.0–52.0)
Hemoglobin: 8.7 g/dL — ABNORMAL LOW (ref 13.0–17.0)
MCH: 30 pg (ref 26.0–34.0)
MCHC: 32.2 g/dL (ref 30.0–36.0)
MCV: 93.1 fL (ref 80.0–100.0)
Platelets: 303 10*3/uL (ref 150–400)
RBC: 2.9 MIL/uL — ABNORMAL LOW (ref 4.22–5.81)
RDW: 14.4 % (ref 11.5–15.5)
WBC: 7.2 10*3/uL (ref 4.0–10.5)
nRBC: 0 % (ref 0.0–0.2)

## 2020-11-21 LAB — BASIC METABOLIC PANEL
Anion gap: 8 (ref 5–15)
BUN: 32 mg/dL — ABNORMAL HIGH (ref 8–23)
CO2: 26 mmol/L (ref 22–32)
Calcium: 8.6 mg/dL — ABNORMAL LOW (ref 8.9–10.3)
Chloride: 104 mmol/L (ref 98–111)
Creatinine, Ser: 1.15 mg/dL (ref 0.61–1.24)
GFR, Estimated: >60 mL/min (ref >60)
Glucose, Bld: 195 mg/dL — ABNORMAL HIGH (ref 70–99)
Potassium: 4.1 mmol/L (ref 3.5–5.1)
Sodium: 138 mmol/L (ref 135–145)

## 2020-11-21 LAB — HEPARIN LEVEL (UNFRACTIONATED): Heparin Unfractionated: 0.46 IU/mL (ref 0.30–0.70)

## 2020-11-21 LAB — BRAIN NATRIURETIC PEPTIDE: B Natriuretic Peptide: 502.7 pg/mL — ABNORMAL HIGH (ref 0.0–100.0)

## 2020-11-21 LAB — PROTIME-INR
INR: 1.5 — ABNORMAL HIGH (ref 0.8–1.2)
Prothrombin Time: 17.7 seconds — ABNORMAL HIGH (ref 11.4–15.2)

## 2020-11-21 LAB — GLUCOSE, CAPILLARY
Glucose-Capillary: 141 mg/dL — ABNORMAL HIGH (ref 70–99)
Glucose-Capillary: 174 mg/dL — ABNORMAL HIGH (ref 70–99)

## 2020-11-21 MED ORDER — WARFARIN SODIUM 5 MG PO TABS
10.0000 mg | ORAL_TABLET | Freq: Once | ORAL | Status: AC
  Administered 2020-11-21: 10 mg via ORAL
  Filled 2020-11-21: qty 2

## 2020-11-21 MED ORDER — NEPRO/CARBSTEADY PO LIQD: 237.0000 mL | Freq: Two times a day (BID) | ORAL | 0 refills | Status: DC

## 2020-11-21 MED ORDER — ENOXAPARIN SODIUM 100 MG/ML IJ SOSY
100.0000 mg | PREFILLED_SYRINGE | Freq: Two times a day (BID) | INTRAMUSCULAR | 0 refills | Status: DC
  Filled 2020-11-21: qty 10, 8d supply, fill #0

## 2020-11-21 MED ORDER — WARFARIN SODIUM 5 MG PO TABS
ORAL_TABLET | ORAL | 2 refills | Status: DC
  Filled 2020-11-21: qty 40, 28d supply, fill #0

## 2020-11-21 MED ORDER — LOSARTAN POTASSIUM 25 MG PO TABS
25.0000 mg | ORAL_TABLET | Freq: Every day | ORAL | 1 refills | Status: DC
  Filled 2020-11-21: qty 30, 30d supply, fill #0

## 2020-11-21 MED ORDER — CERTAVITE/ANTIOXIDANTS PO TABS
1.0000 | ORAL_TABLET | Freq: Every day | ORAL | 1 refills | Status: DC
  Filled 2020-11-21: qty 30, 30d supply, fill #0

## 2020-11-21 MED ORDER — CARVEDILOL 3.125 MG PO TABS
3.1250 mg | ORAL_TABLET | Freq: Two times a day (BID) | ORAL | 1 refills | Status: DC
  Filled 2020-11-21: qty 60, 30d supply, fill #0

## 2020-11-21 MED ORDER — FUROSEMIDE 40 MG PO TABS
40.0000 mg | ORAL_TABLET | Freq: Every day | ORAL | 1 refills | Status: DC | PRN
  Filled 2020-11-21: qty 30, 30d supply, fill #0

## 2020-11-21 NOTE — Plan of Care (Signed)
  Problem: Education: Goal: Knowledge of General Education information will improve Description: Including pain rating scale, medication(s)/side effects and non-pharmacologic comfort measures 11/21/2020 1407 by Camillia Herter, RN Outcome: Adequate for Discharge 11/21/2020 1407 by Camillia Herter, RN Outcome: Progressing   Problem: Health Behavior/Discharge Planning: Goal: Ability to manage health-related needs will improve 11/21/2020 1407 by Camillia Herter, RN Outcome: Adequate for Discharge 11/21/2020 1407 by Camillia Herter, RN Outcome: Progressing   Problem: Clinical Measurements: Goal: Ability to maintain clinical measurements within normal limits will improve 11/21/2020 1407 by Camillia Herter, RN Outcome: Adequate for Discharge 11/21/2020 1407 by Camillia Herter, RN Outcome: Progressing Goal: Will remain free from infection 11/21/2020 1407 by Camillia Herter, RN Outcome: Adequate for Discharge 11/21/2020 1407 by Camillia Herter, RN Outcome: Progressing Goal: Diagnostic test results will improve 11/21/2020 1407 by Camillia Herter, RN Outcome: Adequate for Discharge 11/21/2020 1407 by Camillia Herter, RN Outcome: Progressing Goal: Respiratory complications will improve 11/21/2020 1407 by Camillia Herter, RN Outcome: Adequate for Discharge 11/21/2020 1407 by Julius Bowels A, RN Outcome: Progressing Goal: Cardiovascular complication will be avoided 11/21/2020 1407 by Camillia Herter, RN Outcome: Adequate for Discharge 11/21/2020 1407 by Camillia Herter, RN Outcome: Progressing   Problem: Activity: Goal: Risk for activity intolerance will decrease 11/21/2020 1407 by Camillia Herter, RN Outcome: Adequate for Discharge 11/21/2020 1407 by Camillia Herter, RN Outcome: Progressing   Problem: Nutrition: Goal: Adequate nutrition will be maintained 11/21/2020 1407 by Camillia Herter, RN Outcome: Adequate for Discharge 11/21/2020 1407 by Camillia Herter, RN Outcome:  Progressing   Problem: Coping: Goal: Level of anxiety will decrease 11/21/2020 1407 by Camillia Herter, RN Outcome: Adequate for Discharge 11/21/2020 1407 by Camillia Herter, RN Outcome: Progressing   Problem: Elimination: Goal: Will not experience complications related to bowel motility 11/21/2020 1407 by Camillia Herter, RN Outcome: Adequate for Discharge 11/21/2020 1407 by Camillia Herter, RN Outcome: Progressing Goal: Will not experience complications related to urinary retention 11/21/2020 1407 by Camillia Herter, RN Outcome: Adequate for Discharge 11/21/2020 1407 by Camillia Herter, RN Outcome: Progressing   Problem: Pain Managment: Goal: General experience of comfort will improve 11/21/2020 1407 by Camillia Herter, RN Outcome: Adequate for Discharge 11/21/2020 1407 by Camillia Herter, RN Outcome: Progressing   Problem: Safety: Goal: Ability to remain free from injury will improve 11/21/2020 1407 by Camillia Herter, RN Outcome: Adequate for Discharge 11/21/2020 1407 by Camillia Herter, RN Outcome: Progressing   Problem: Skin Integrity: Goal: Risk for impaired skin integrity will decrease 11/21/2020 1407 by Camillia Herter, RN Outcome: Adequate for Discharge 11/21/2020 1407 by Camillia Herter, RN Outcome: Progressing

## 2020-11-21 NOTE — Discharge Summary (Addendum)
Rodney [name] Discharge Summary  Patient name: Rodney [name] Medical record number: [id number] Date of birth: Sep 02, 1950 Age: 70 y.o. Gender: male Date of Admission: 11/18/2020  Date of Discharge: 11/21/20 Admitting Physician: Martyn Malay, MD  Primary Care Provider: [name], MD Consultants: Orthopedics, Cardiology  Indication for Hospitalization: Right lower extremity Bullae/ Blister   Discharge Diagnoses/Problem List:  Principal Problem:   Cellulitis Active Problems:   DM (diabetes mellitus), type 2 with neurological complications (Rodney [name])   Protein calorie malnutrition (Rodney [name])   PVD (peripheral vascular disease) (Rodney [name])   AF (paroxysmal atrial fibrillation) (Rodney [name])   CKD stage 3 due to type 2 diabetes mellitus (Rodney [name])   Acute on chronic anemia   History of DVT (deep vein thrombosis)   Elevated troponin   Disposition: Home  Discharge Condition: Stable  Discharge Exam: Per Dr. Adah Salvage on 11/21/2020: Blood pressure (!) 118/54, pulse 69, temperature 98 F (36.7 C), temperature source Oral, resp. rate 18, height 6' (1.829 m), weight 110 kg, SpO2 95 %. General: Awake, Laying in bed, Pleasant, NAD Cadiovascular:RRR, 2/6 systolic murmur Respiratory: Scatter wheezing bilaterally Abdomen:No distension or tenderness  Extremities:LLE amputated, +1 edema on RLE  Neuro: Oriented x4, Pleasant affect    Brief [name] Course:  Rodney [name] is a 70 year old male presenting with left lower extremity edema, and bullae/blisters.  PMH is significant for T2DM, neuropathy, CKD stage III, HFpEF, aortic stenosis, A. fib, hypertension, hyperlipidemia, CAD and PAD.  Right lower extremity bullae/blisters Patient was admitted in the ED a day after discharge from hospitalization following a right great toe amputation.  He said he woke up the next morning noticing bullae and blisters on his right foot. He has had similar episode in April this year.  He denies  having any pain or tenderness.  Orthopedics was consulted due to concerns this could be a reaction to his surgery or wound treatment.  Patient was admitted for IV antibiotics due to concerns of cellulitis.  He was started on IV clindamycin, ciprofloxacin and vancomycin in the ED. His antibiotics were stopped as further evaluation showed no signs of cellulitis or infection after Ortho evaluation.  Ortho recommended 20 to 30 mmHg compression socks for patient and also outpatient follow-up.  HFpEF  Fluid overload Patient reported having worsening chronic shortness of breath over the past week since modifications of his blood pressure medication.  He reported PCP decreased his carvedilol and losartan due to soft blood pressures and Lasix was changed to PRN.  His troponin in the ED was elevated at 1462 and BNP 198. Elevated troponin was most likely related to demand ischemia and exacerbated CHF.  Cardiology was consulted and chest x-ray showed new pleural effusion.  Due to concerns of exacerbating CHF, patient was put on strict I's/O's and diuresed with Lasix 40 mg IV BID.  Patient was discharged on 40 mg oral Lasix daily.  For hypertension patient was also discharged on losartan 25 mg daily.  Breathing therapy  Long standing smoker During patient's visit in the ED he received a breathing treatment ska4 SOB and diffuse wheezing on physical exam.  Due to his longstanding history of smoking there were concerns of possible COPD.  Patient was recommended to follow up with PCP for possible COPD diagnosis.   Discharge recommendations:  PCP to consider outpatient PFTs to confirm likely COPD  Continue high dose Statin, BB, Plavix (ASA switched to plavix for PAD) Per inpatient cardiologist, will need close follow up as outpt with W  ONeal to review, plan future care Per cardiology, Keep on coreg and amiodarone Patient will need to follow-up with orthopedics as instructed Discharged on lasix 17m daily, recommend  BMP at follow up  Discharged on losartan 25 mg daily. Patient discharged with ULouretta Parmaboot being changed on 11/21/2020, recommend assessment at follow-up and likely change of Unna boot at that time   Significant Procedures: None  Significant Labs and Imaging:  Recent Labs  Lab 11/19/20 0358 11/20/20 0122 11/21/20 0031  WBC 8.7 7.4 7.2  HGB 9.5* 8.7* 8.7*  HCT 31.4* 26.5* 27.0*  PLT 287 251 303   Recent Labs  Lab 11/17/20 0115 11/18/20 1058 11/19/20 0358 11/20/20 0122 11/21/20 1202  NA 135 135 137 137 138  K 5.0 4.9 4.8 4.3 4.1  CL 107 104 108 107 104  CO2 23 22 21* 24 26  GLUCOSE 129* 181* 155* 154* 195*  BUN 35* 39* 42* 38* 32*  CREATININE 1.53* 1.48* 1.47* 1.32* 1.15  CALCIUM 8.2* 9.1 8.8* 8.4* 8.6*  ALKPHOS  --  56 48  --   --   AST  --  19 20  --   --   ALT  --  12 12  --   --   ALBUMIN  --  2.8* 2.4*  --   --    CHEST - 2 VIEW (06/21/20) IMPRESSION: Small right pleural effusion is new since the prior examination. Small left pleural effusion has decreased since the prior study. Cardiomegaly and interstitial edema appear unchanged. Aortic Atherosclerosis (ICD10-I70.0).  RIGHT FOOT COMPLETE - 3+ VIEW (11/18/20) IMPRESSION: 1. Limited by overlapping bandage, especially for detecting soft tissue emphysema. 2. No bony fracture or erosion.  PORTABLE CHEST 1 VIEW (11/17/20)IMPRESSION: Mild cardiomegaly with mild diffuse prominence of the parahilar interstitial markings, suggesting mild cardiogenic pulmonary edema.   Asymmetric mild volume loss in the left hemithorax. Asymmetric smooth peripheral left pleural thickening with adjacent curvilinear lung opacities, mildly increased from prior, favoring chronic pleural-parenchymal scarring, with loculated small left pleural effusion not excluded.      Results/Tests Pending at Time of Discharge: None  Discharge Medications:  Allergies as of 11/21/2020   No Known Allergies      Medication List     STOP taking  these medications    nutrition supplement (JUVEN) Pack Replaced by: feeding supplement (NEPRO CARB STEADY) Liqd   oxyCODONE 5 MG immediate release tablet Commonly known as: Oxy IR/ROXICODONE       TAKE these medications    acetaminophen 325 MG tablet Commonly known as: TYLENOL Take 1-2 tablets (325-650 mg total) by mouth every 6 (six) hours as needed for mild pain (pain score 1-3 or temp > 100.5).   amiodarone 200 MG tablet Commonly known as: PACERONE Take 1 tablet (200 mg total) by mouth daily.   atorvastatin 80 MG tablet Commonly known as: LIPITOR TAKE 1 TABLET EVERY DAY   carvedilol 3.125 MG tablet Commonly known as: COREG Take 1 tablet (3.125 mg total) by mouth 2 (two) times daily with a meal. What changed:  medication strength how much to take   CertaVite/Antioxidants Tabs Take 1 tablet by mouth at bedtime.   clopidogrel 75 MG tablet Commonly known as: PLAVIX Take 1 tablet (75 mg total) by mouth daily with breakfast.   enoxaparin 100 MG/ML injection Commonly known as: LOVENOX Inject 1 mL (100 mg total) into the skin 2 (two) times daily for 5 days. What changed: when to take this   feeding supplement (NEPRO CARB  STEADY) Liqd Take 237 mLs by mouth 2 (two) times daily between meals. Start taking on: November 22, 2020 Replaces: nutrition supplement (JUVEN) Pack   furosemide 40 MG tablet Commonly known as: LASIX Take 1 tablet (40 mg total) by mouth daily as needed for fluid (swelling).   gabapentin 100 MG capsule Commonly known as: NEURONTIN Take 2 capsules (200 mg total) by mouth 3 (three) times daily.   losartan 25 MG tablet Commonly known as: COZAAR Take 1 tablet (25 mg total) by mouth daily. Start taking on: November 22, 2020   metFORMIN 1000 MG tablet Commonly known as: GLUCOPHAGE Take 1 tablet (1,000 mg total) by mouth daily.   multivitamin with minerals Tabs tablet Take 1 tablet by mouth daily.   nicotine 14 mg/24hr patch Commonly known as:  NICODERM CQ - dosed in mg/24 hours Place 1 patch (14 mg total) onto the skin daily.   NovoLIN 70/30 ReliOn (70-30) 100 UNIT/ML injection Generic drug: insulin NPH-regular Human Inject 15-30 Units into the skin See admin instructions. Inject 30 units into the skin with breakfast and 15 units with supper   ReliOn True Metrix Test Strips test strip Generic drug: glucose blood Use to test blood sugar three times per day.   senna 8.6 MG Tabs tablet Commonly known as: SENOKOT Take 1 tablet (8.6 mg total) by mouth daily.   True Metrix Meter Devi Use to test blood sugar three times daily.   True Metrix Meter w/Device Kit USE AS DIRECTED   TRUEplus Lancets 33G Misc Use to test blood sugar three times per day.   umeclidinium bromide 62.5 MCG/INH Aepb Commonly known as: INCRUSE ELLIPTA Inhale 1 puff into the lungs daily.   warfarin 5 MG tablet Commonly known as: COUMADIN Take as directed. If you are unsure how to take this medication, talk to your nurse or doctor. Original instructions: Take 7.50m (one and a half tablets) on Mondays, Wednesdays and Fridays; Take 590m(one tablet) on Tuesdays, Thursdays, Saturdays and Sundays. OR as directed by Warfarin Clinic What changed:  how much to take how to take this when to take this additional instructions   zinc sulfate 220 (50 Zn) MG capsule Take 1 capsule (220 mg total) by mouth daily.        Discharge Instructions: Please refer to Patient Instructions section of EMR for full details.  Patient was counseled important signs and symptoms that should prompt return to medical care, changes in medications, dietary instructions, activity restrictions, and follow up appointments.   Follow-Up Appointments:  Follow-up Information     Hensel, WiJamal CollinMD. Go on 12/02/2020.   Specialty: Family Medicine Why: At 10:50am. Please arrive by 10:35am. This is your [name] follow up with your primary care doctor, Dr. HeAndria FramesIf this day time does  not work for you, please call the clinic directly to reschedule. Contact information: [phone]       O'Geralynn RileMD. Schedule an appointment as soon as possible for a visit in 1 week(s).   Specialties: Cardiology, Internal Medicine, Radiology Why: Make a [name] follow up for your heart health. This will need to be as soon as possible after leaving the [name]. Contact information: [id number] on 11/25/2020.   Specialty: Family Medicine Why: At 9:30am. Please arrive by 9:15am. This will be a nurse-only appointment before you go  to the lab for an INR check (warfarin check).The nurse will evaluate your right leg wrap and may re-do it. Contact information: [address] [id number] [place]. Go on 11/28/2020.   Specialty: Family Medicine Why: At 9:30am. Please arrive by 9:15am.This is a nurse-only visit. The nurse will evaluate your right leg wrap and may re-do it. Contact information: [address] [id number] [place]        Newt Minion, MD. Schedule an appointment as soon as possible for a visit.   Specialty: Orthopedic Surgery Why: Call ASAP and make an appointment with your orthopedist office for early next week. Contact information: [place] [address] [phone]                 Lurline Del, DO 11/21/2020, 4:44 PM PGY-3, [place]

## 2020-11-21 NOTE — Progress Notes (Addendum)
ANTICOAGULATION CONSULT NOTE - Initial Consult  Pharmacy Consult for heparin and coumadin Indication: atrial fibrillation  No Known Allergies  Patient Measurements: Height: 6' (182.9 cm) Weight: 110 kg (242 lb 8.1 oz) IBW/kg (Calculated) : 77.6 Heparin Dosing Weight: 100.9 kg  Vital Signs: Temp: 97.9 F (36.6 C) (07/28 0830) Temp Source: Oral (07/28 0830) BP: 144/75 (07/28 0830) Pulse Rate: 75 (07/28 0830)  Labs: Recent Labs    11/18/20 1058 11/19/20 0358 11/19/20 0558 11/19/20 1450 11/19/20 2310 11/20/20 0122 11/21/20 0031  HGB 9.8* 9.5*  --   --   --  8.7* 8.7*  HCT 31.8* 31.4*  --   --   --  26.5* 27.0*  PLT 299 287  --   --   --  251 303  LABPROT  --  16.3*  --   --   --  17.1* 17.7*  INR  --  1.3*  --   --   --  1.4* 1.5*  HEPARINUNFRC  --  0.12*  --    < > 0.47 0.42 0.46  CREATININE 1.48* 1.47*  --   --   --  1.32*  --   TROPONINIHS  --  1,462* 1,154*  --   --   --   --    < > = values in this interval not displayed.     Estimated Creatinine Clearance: 68.6 mL/min (A) (by C-G formula based on SCr of 1.32 mg/dL (H)).   Medical History: Past Medical History:  Diagnosis Date   Aortic stenosis    moderate in 2022   Atrial fibrillation (HCC)    CHF (congestive heart failure) (HCC)    Coronary artery disease    Diabetes mellitus without complication (HCC)    HLD (hyperlipidemia)    Hypertension    Peripheral arterial disease [facility])     Assessment: 71 yo man on warfarin for afib and hx DVT April 2022, now being held for possible procedure for RLE wounds. Admit INR 1.3. Pharmacy consulted for heparin and warfarin.   HL therapeutic at 0.46, on heparin drip at 2350 units/hr. H/H, plt stable. No signs/symptoms of bleeding noted.   INR has been subtherapeutic since 7/16 due to holding for procedure. INR today 1.5. Eating 100%, metronidazole discontinued, no new drug interactions.   Warfarin PTA dose starting 7/24: 7.5mg  x1 then 5mg  daily with enoxaparin  100mg  Q12 hr x 7 day bridge but patient reported did not start taking enoxaparin yet since was readmitted on 7/25.  In the past 3-4 months, patient has been mostly subtherapeutic on 5mg  to 7.5mg  daily. Patient was subtherapeutic when new DVT was discovered in April 2022. Patient does not appear to be on a stable regimen.  Goal of Therapy:  INR 2-3 Heparin level 0.3-0.7 units/ml Monitor platelets by anticoagulation protocol: Yes   Plan:  Continue heparin drip at 2350 units/hr. Give warfarin 10mg  x 1. If discharged today, suggest starting new home regimen tomorrow - warfarin 7.5mg  M/W/F and 5mg  Tu/Thu/Sa/Su (patient has 5mg  tablets). Administer Enoxaparin 100mg  Q 12 hours until INR follow up on Monday 8/1.  Recommend f/u INR check by Monday (8/1) - spoke with patient, patient agrees Monitor daily INR, HL, CBC/plt Monitor for signs/symptoms of bleeding   Vance Peper, PharmD PGY1 Resident 11/21/2020 9:23 AM    Please check AMION for all [place] phone numbers After 10:00 PM, call [place] [phone]

## 2020-11-21 NOTE — Progress Notes (Signed)
Progress Note  Patient Name: ODUS CLASBY Date of Encounter: 11/21/2020  [facility] Cardiologist: Evalina Field, MD   Subjective   Patient says his breathing is OK  He denies CP     Inpatient Medications    Scheduled Meds:  amiodarone  200 mg Oral Daily   atorvastatin  80 mg Oral Daily   carvedilol  3.125 mg Oral BID WC   clopidogrel  75 mg Oral Q breakfast   feeding supplement (NEPRO CARB STEADY)  237 mL Oral BID BM   furosemide  40 mg Intravenous BID   gabapentin  200 mg Oral TID   insulin aspart  0-15 Units Subcutaneous TID WC   losartan  25 mg Oral Daily   multivitamin  1 tablet Oral QHS   nicotine  14 mg Transdermal Daily   umeclidinium bromide  1 puff Inhalation Daily   Warfarin - Pharmacist Dosing Inpatient   Does not apply q1600   Continuous Infusions:  heparin 2,350 Units/hr (11/21/20 0216)   PRN Meds: acetaminophen, ipratropium-albuterol, oxyCODONE   Vital Signs    Vitals:   11/21/20 0021 11/21/20 0419 11/21/20 0830 11/21/20 0835  BP: 132/72 123/66 (!) 144/75   Pulse: 71 70 75   Resp: 16 17 19    Temp: 98.3 F (36.8 C) 98 F (36.7 C) 97.9 F (36.6 C)   TempSrc: Oral Oral Oral   SpO2: 96% 92% 96% 95%  Weight:      Height:        Intake/Output Summary (Last 24 hours) at 11/21/2020 0939 Last data filed at 11/21/2020 0910 Gross per 24 hour  Intake 2648.61 ml  Output 4500 ml  Net -1851.39 ml   Last 3 Weights 11/18/2020 11/17/2020 11/16/2020  Weight (lbs) 242 lb 8.1 oz 243 lb 13.3 oz 238 lb 15.7 oz  Weight (kg) 110 kg 110.6 kg 108.4 kg      Telemetry    No Telemetry Personally Reviewed  ECG    No new EKG to review - Personally Reviewed  Physical Exam   GEN: Well nourished, well developed in no acute distress HEENT: Normal NECK: No JVD; JVP is normal    CARDIAC:RRR, no  rubs, gallops.  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  SKIN: Warm and dry NEUROLOGIC:  Alert and oriented x 3 PSYCHIATRIC:  Normal affect   Labs    High Sensitivity Troponin:   Recent Labs  Lab 11/19/20 0358 11/19/20 0558  TROPONINIHS 1,462* 1,154*      Chemistry Recent Labs  Lab 11/18/20 1058 11/19/20 0358 11/20/20 0122  NA 135 137 137  K 4.9 4.8 4.3  CL 104 108 107  CO2 22 21* 24  GLUCOSE 181* 155* 154*  BUN 39* 42* 38*  CREATININE 1.48* 1.47* 1.32*  CALCIUM 9.1 8.8* 8.4*  PROT 6.9 6.0*  --   ALBUMIN 2.8* 2.4*  --   AST 19 20  --   ALT 12 12  --   ALKPHOS 56 48  --   BILITOT 0.6 0.5  --   GFRNONAA 51* 52* 59*  ANIONGAP 9 8 6      Hematology Recent Labs  Lab 11/19/20 0358 11/20/20 0122 11/21/20 0031  WBC 8.7 7.4 7.2  RBC 3.19* 2.85* 2.90*  HGB 9.5* 8.7* 8.7*  HCT 31.4* 26.5* 27.0*  MCV 98.4 93.0 93.1  MCH 29.8 30.5 30.0  MCHC 30.3 32.8 32.2  RDW 14.3 14.2 14.4  PLT 287 251 303    BNP Recent Labs  Lab 11/17/20 0115  BNP 198.3*     DDimer No results for input(s): DDIMER in the last 168 hours.   CHA2DS2-VASc Score = 5  This indicates a 7.2% annual risk of stroke. The patient's score is based upon: CHF History: Yes HTN History: Yes Diabetes History: Yes Stroke History: No Vascular Disease History: Yes Age Score: 1 Gender Score: 0   Radiology    ECHOCARDIOGRAM COMPLETE  Result Date: 11/19/2020    ECHOCARDIOGRAM REPORT   Patient Name:   STANFORD STRAUCH Date of Exam: 11/19/2020 Medical Rec #:  [id number]         Height:       72.0 in Accession #:    [id number]        Weight:       242.5 lb Date of Birth:  June 23, 1950        BSA:          2.312 m Patient Age:    69 years          BP:           131/76 mmHg Patient Gender: M                 HR:           80 bpm. Exam Location:  Inpatient Procedure: 2D Echo, Cardiac Doppler, Color Doppler and Intracardiac            Opacification Agent Indications:    Dyspnea R06.00  History:        Patient has prior history of  Echocardiogram examinations, most                 recent 07/30/2020. CHF, CAD, Arrythmias:Atrial Fibrillation; Risk                 Factors:Hypertension, Diabetes, Current Smoker and Dyslipidemia.                 AS. PAD.  Sonographer:    Vickie Epley RDCS Referring Phys: [id number] CARINA M BROWN IMPRESSIONS  1. Left ventricular ejection fraction, by estimation, is 50 to 55%. The left ventricle has low normal function. The left ventricle has no regional wall motion abnormalities. The left ventricular internal cavity size was mildly dilated. Left ventricular diastolic parameters are consistent with Grade II diastolic dysfunction (pseudonormalization). Elevated left ventricular end-diastolic pressure.  2. Right ventricular systolic function is normal. The right ventricular size is normal. There is normal pulmonary artery systolic pressure.  3. The mitral valve is normal in structure. Trivial mitral valve regurgitation. No evidence of mitral stenosis. Moderate mitral annular calcification.  4. The aortic valve is calcified. There is moderate calcification of the aortic valve. Aortic valve regurgitation is trivial. Moderate aortic valve stenosis. Aortic valve area, by VTI measures 0.73 cm. Aortic valve mean gradient measures 35.0 mmHg. Aortic valve Vmax measures 3.69 m/s.  5. Aortic dilatation noted. There is mild dilatation of the ascending aorta, measuring 39 mm.  6. The inferior vena cava is dilated in size with >50% respiratory variability, suggesting right atrial pressure of 8 mmHg. FINDINGS  Left Ventricle: Left ventricular ejection fraction, by estimation, is 50 to 55%. The left ventricle has low normal function. The left ventricle has no regional wall motion abnormalities. Definity contrast agent was given IV to delineate the  left ventricular endocardial borders. The left ventricular internal cavity size was mildly dilated. There is no left ventricular hypertrophy. Left ventricular diastolic parameters are  consistent with Grade II diastolic dysfunction (pseudonormalization). Elevated left ventricular end-diastolic pressure. Right Ventricle: The right ventricular size is normal. No increase in right ventricular wall thickness. Right ventricular systolic function is normal. There is normal pulmonary artery systolic pressure. The tricuspid regurgitant velocity is 1.57 m/s, and  with an assumed right atrial pressure of 8 mmHg, the estimated right ventricular systolic pressure is 19.1 mmHg. Left Atrium: Left atrial size was normal in size. Right Atrium: Right atrial size was normal in size. Pericardium: There is no evidence of pericardial effusion. Mitral Valve: The mitral valve is normal in structure. Moderate mitral annular calcification. Trivial mitral valve regurgitation. No evidence of mitral valve stenosis. Tricuspid Valve: The tricuspid valve is normal in structure. Tricuspid valve regurgitation is trivial. No evidence of tricuspid stenosis. Aortic Valve: The aortic valve is calcified. There is moderate calcification of the aortic valve. Aortic valve regurgitation is trivial. Moderate aortic stenosis is present. Aortic valve mean gradient measures 35.0 mmHg. Aortic valve peak gradient measures 54.5 mmHg. Aortic valve area, by VTI measures 0.73 cm. Pulmonic Valve: The pulmonic valve was normal in structure. Pulmonic valve regurgitation is not visualized. No evidence of pulmonic stenosis. Aorta: Aortic dilatation noted. There is mild dilatation of the ascending aorta, measuring 39 mm. Venous: The inferior vena cava is dilated in size with greater than 50% respiratory variability, suggesting right atrial pressure of 8 mmHg. IAS/Shunts: No atrial level shunt detected by color flow Doppler.  LEFT VENTRICLE PLAX 2D LVIDd:         5.90 cm      Diastology LVIDs:         4.30 cm      LV e' medial:    5.84 cm/s LV PW:         1.00 cm      LV E/e' medial:  21.2 LV IVS:        1.00 cm      LV e' lateral:   8.27 cm/s LVOT diam:      2.20 cm      LV E/e' lateral: 15.0 LV SV:         66 LV SV Index:   28 LVOT Area:     3.80 cm  LV Volumes (MOD) LV vol d, MOD A2C: 152.0 ml LV vol d, MOD A4C: 174.0 ml LV vol s, MOD A2C: 62.1 ml LV vol s, MOD A4C: 68.2 ml LV SV MOD A2C:     89.9 ml LV SV MOD A4C:     174.0 ml LV SV MOD BP:      98.0 ml RIGHT VENTRICLE RV S prime:     14.80 cm/s TAPSE (M-mode): 2.6 cm LEFT ATRIUM             Index       RIGHT ATRIUM           Index LA diam:        5.50 cm 2.38 cm/m  RA Area:     20.20 cm LA Vol (A2C):   64.8 ml 28.02 ml/m RA Volume:   59.50 ml  25.73 ml/m LA Vol (A4C):   65.5 ml 28.33 ml/m LA Biplane Vol: 66.4 ml 28.71 ml/m  AORTIC VALVE AV Area (Vmax):    0.81 cm AV Area (Vmean):   0.73 cm AV Area (VTI):  0.73 cm AV Vmax:           369.00 cm/s AV Vmean:          275.500 cm/s AV VTI:            0.900 m AV Peak Grad:      54.5 mmHg AV Mean Grad:      35.0 mmHg LVOT Vmax:         78.30 cm/s LVOT Vmean:        53.000 cm/s LVOT VTI:          0.173 m LVOT/AV VTI ratio: 0.19  AORTA Ao Root diam: 3.50 cm Ao Asc diam:  3.90 cm MITRAL VALVE                TRICUSPID VALVE MV Area (PHT): 4.60 cm     TR Peak grad:   9.9 mmHg MV Decel Time: 165 msec     TR Vmax:        157.00 cm/s MR Peak grad: 112.4 mmHg MR Vmax:      530.00 cm/s   SHUNTS MV E velocity: 124.00 cm/s  Systemic VTI:  0.17 m MV A velocity: 66.10 cm/s   Systemic Diam: 2.20 cm MV E/A ratio:  1.88 Skeet Latch MD Electronically signed by Skeet Latch MD Signature Date/Time: 11/19/2020/12:04:39 PM    Final     Cardiac Studies   Echo 11/19/20: 1. Left ventricular ejection fraction, by estimation, is 50 to 55%. The  left ventricle has low normal function. The left ventricle has no regional  wall motion abnormalities. The left ventricular internal cavity size was  mildly dilated. Left ventricular  diastolic parameters are consistent with Grade II diastolic dysfunction  (pseudonormalization). Elevated left ventricular end-diastolic  pressure.   2. Right ventricular systolic function is normal. The right ventricular  size is normal. There is normal pulmonary artery systolic pressure.   3. The mitral valve is normal in structure. Trivial mitral valve  regurgitation. No evidence of mitral stenosis. Moderate mitral annular  calcification.   4. The aortic valve is calcified. There is moderate calcification of the  aortic valve. Aortic valve regurgitation is trivial. Moderate aortic valve  stenosis. Aortic valve area, by VTI measures 0.73 cm. Aortic valve mean  gradient measures 35.0 mmHg.  Aortic valve Vmax measures 3.69 m/s.   5. Aortic dilatation noted. There is mild dilatation of the ascending  aorta, measuring 39 mm.   6. The inferior vena cava is dilated in size with >50% respiratory  variability, suggesting right atrial pressure of 8 mmHg.  Patient Profile     70 y.o. 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 Ardelia Mems.  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 Would continue medical management    No ischemic workup at this time   Acute on chronic diastolic CHF  - echo back  in 09/2019 showed LV EF 35-40%  Echo this admit shows LVEF now is 50 to 55%  Aortic stenosis present Pt received IV lasix yesterday with some  increased outpt   Net neg 1.3 L      Would check BMET today    BP has limited GDMT   Pt  is on low dose losartan, carvedilol   Off of aldactone May do well with daily lasix po at home    PAF Chronic anticoagulation with coumadin This patients CHA2DS2-VASc Score and unadjusted Ischemic Stroke  Rate (% per year) is equal to 7.2 % stroke rate/year from a score of 5 (age, CAD, HTN, DM, CHF) Developed post-op following 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 Successful TEE-DCCV 09/2019 but reverted back to Afib 11/03/19. Maintained on amiodarone, converted to sinus rhythm by 02/15/20. Clinically in SR    Keep on coreg and amiodarone   ON heparin for now  as load coumadin   CAD  - cath a year ago showed CTo of RCA with L>R collaterals and no other CAD - denies any anginal sx - continue 80 mg lipitor, plavix (ASA switched to plavix for PAD  Aortic stenosis     I have reviewed echo from this admit   Peak and mean gradients through the valve  Mean gradient through the valve is 35 mm Hg    Dimensionless index is 0.19 suggesting AS is severe    Will need to watch Na,(pt says he doesn't use salt at home)  Watch I/O   Pt will need close follow up as outpt with [facility] to review, plan future care    PAD 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  > 50% of time was spent in direct patient care.          For questions or updates, please contact [place] Please consult [email or web address] for contact info under        Signed, Dorris Carnes, MD  11/21/2020, 9:39 AM

## 2020-11-21 NOTE — Plan of Care (Signed)

## 2020-11-21 NOTE — Progress Notes (Signed)
Physical Therapy Treatment Patient Details Name: Rodney [name] MRN: [id number] DOB: 12-17-1950 Today's Date: 11/21/2020    History of Present Illness Rodney [name] is a 70 y.o. male who presented 7/25 with RLE bullae, blister, edema and erythema. He was recently admitted for R great toe amputation on 7/22, TWB L heel only, discharged 7/24.  PMH includes PVD s/p femoropopliteal bypass with complication of cellulitis, CAD, diabetes mellitus, chronic kidney disease, hypertension, and afib.    PT Comments    Pt agreeable to practicing w/c transfer today, performed well with close guard for safety and requires cues for locking w/c prior to transfer. PT also emphasizing the importance of maintaining TDWB through R heel for protection of surgery site and health of foot, pt maintains this well today. Pt with no further questions, and eager to d/c home.    Follow Up Recommendations  Home health PT;Supervision/Assistance - 24 hour     Equipment Recommendations  None recommended by PT    Recommendations for Other Services       Precautions / Restrictions Precautions Precautions: Fall Required Braces or Orthoses: Other Brace Other Brace: has L prosthesis, left at home during inpt stay Restrictions Other Position/Activity Restrictions: on heel; with postop shoe (did not bring post-op shoe)    Mobility  Bed Mobility Overal bed mobility: Needs Assistance Bed Mobility: Supine to Sit;Sit to Supine     Supine to sit: Min assist Sit to supine: Supervision   General bed mobility comments: min assist for trunk elevation via HHA, pt does not sleep in a bed at baseline so this is a difficult skill for him. supervision for return to supine for safety.    Transfers Overall transfer level: Needs assistance Equipment used: None Transfers: Lateral/Scoot Transfers          Lateral/Scoot Transfers: Min guard General transfer comment: for safety, PT holding wheelchair steady and cuing  for sequencing. PT also verbalizing importance of locking w/c, removing one arm rest to ease of lateral scooting/safety, use of sliding board (pt declines this), TDWB through heel only RLE, and AP transfers in/out of w/c which pt states he does most of the time.  Ambulation/Gait                 Stairs             Wheelchair Mobility    Modified Rankin (Stroke Patients Only)       Balance Overall balance assessment: Needs assistance Sitting-balance support: No upper extremity supported Sitting balance-Leahy Scale: Good         Standing balance comment: NT - scoot pivot only                            Cognition Arousal/Alertness: Awake/alert Behavior During Therapy: WFL for tasks assessed/performed Overall Cognitive Status: Within Functional Limits for tasks assessed                                 General Comments: Pt with very dry, sarcastic sense of humor; more agreeable to therapies when diffused with humor.      Exercises      General Comments        Pertinent Vitals/Pain Pain Assessment: Faces Faces Pain Scale: No hurt Pain Intervention(s): Limited activity within patient's tolerance;Monitored during session    Home Living  Prior Function            PT Goals (current goals can now be found in the care plan section) Acute Rehab PT Goals Patient Stated Goal: go home PT Goal Formulation: With patient Time For Goal Achievement: 12/04/20 Potential to Achieve Goals: Good Progress towards PT goals: Progressing toward goals    Frequency    Min 3X/week      PT Plan Current plan remains appropriate    Co-evaluation              AM-PAC PT "6 Clicks" Mobility   Outcome Measure  Help needed turning from your back to your side while in a flat bed without using bedrails?: None Help needed moving from lying on your back to sitting on the side of a flat bed without using bedrails?:  A Little Help needed moving to and from a bed to a chair (including a wheelchair)?: A Little Help needed standing up from a chair using your arms (e.g., wheelchair or bedside chair)?: A Little Help needed to walk in hospital room?: A Lot Help needed climbing 3-5 steps with a railing? : A Lot 6 Click Score: 17    End of Session   Activity Tolerance: Patient tolerated treatment well Patient left: in bed;with call bell/phone within reach;with family/visitor present;with nursing/sitter in room (RN at bedside) Nurse Communication: Mobility status PT Visit Diagnosis: Other abnormalities of gait and mobility (R26.89)     Time: 9211-9417 PT Time Calculation (min) (ACUTE ONLY): 18 min  Charges:  $Therapeutic Activity: 8-22 mins                     Stacie Glaze, PT DPT Acute Rehabilitation Services Pager [phone]  Office [phone]    Roxine Caddy E Ruffin Pyo 11/21/2020, 9:13 AM

## 2020-11-21 NOTE — Consult Note (Signed)
   [facility] Inpatient Consult   11/21/2020  [name] 06-Sep-1950 [id number]  [place] [facility] [ACO] Patient:  [name] Primary Care Provider:  [name], MD, [place], [name] CCM  Referral made for [name] Chronic Care Management patient is extreme high risk for unplanned readmission score.  [name], RN BSN [facility] Liaison  [phone] business mobile phone Toll free office [phone]  Fax number: [phone] [email or web address]

## 2020-11-21 NOTE — Progress Notes (Signed)
Rodney [name] to be D/C'd  per MD order.  Discussed with the patient and all questions fully answered.  VSS, Skin clean, dry and intact without evidence of skin break down, no evidence of skin tears noted.  IV catheter discontinued intact. Site without signs and symptoms of complications. Dressing and pressure applied.  An After Visit Summary was printed and given to the patient. Patient received prescription.  D/c education completed with patient/family including follow up instructions, medication list, d/c activities limitations if indicated, with other d/c instructions as indicated by MD - patient able to verbalize understanding, all questions fully answered.   Patient instructed to return to ED, call 911, or call MD for any changes in condition.   Patient to be escorted via [place], and D/C home via private auto.

## 2020-11-21 NOTE — Progress Notes (Signed)
Orthopedic Tech Progress Note Patient Details:  Rodney [name] 1951-03-12 [id number]  Ortho Devices Type of Ortho Device: Haematologist Ortho Device/Splint Location: Right Leg Ortho Device/Splint Interventions: Application   Post Interventions Patient Tolerated: Well  Rodney [name] [name] 11/21/2020, 1:04 PM

## 2020-11-21 NOTE — Progress Notes (Signed)
This nurse changed dressing on RLE, then [employer] applied by ortho tech

## 2020-11-22 ENCOUNTER — Telehealth: Payer: Self-pay | Admitting: *Deleted

## 2020-11-22 NOTE — Chronic Care Management (AMB) (Signed)
  Care Management   Outreach Note  11/22/2020 Name: Rodney [name] MRN: [id number] DOB: 1950-07-23  Referred by: [name], MD Reason for referral : Care Coordination (Initial outreach to schedule referral with [facility] )   An unsuccessful telephone outreach was attempted today. The patient was referred to the case management team for assistance with care management and care coordination.   Follow Up Plan: A HIPAA compliant phone message was left for the patient providing contact information and requesting a return call.  If patient returns call to provider office, please advise to call [name] Care Management Care Guide Presley Gora at [place], [place] Management  Direct Dial: [phone]

## 2020-11-22 NOTE — Chronic Care Management (AMB) (Deleted)
  Chronic Care Management   Outreach Note  11/22/2020 Name: Rodney [name] MRN: [id number] DOB: [birth year]  Rodney [name] is a 70 y.o. year old male who is a primary care patient of Hensel, Jamal Collin, MD. I reached out to Rodney [name] by phone today in response to a referral sent by Rodney [name]'s PCP [name], MD     An unsuccessful telephone outreach was attempted today. The patient was referred to the case management team for assistance with care management and care coordination.   Follow Up Plan: A HIPAA compliant phone message was left for the patient providing contact information and requesting a return call.  If patient returns call to provider office, please advise to call [name] Care Management Care Guide Tauri Ethington at [place], [place] Management  Direct Dial: [phone]

## 2020-11-24 LAB — CULTURE, BLOOD (ROUTINE X 2)
Culture: NO GROWTH
Culture: NO GROWTH

## 2020-11-25 ENCOUNTER — Ambulatory Visit: Payer: Medicare PPO

## 2020-11-26 ENCOUNTER — Other Ambulatory Visit (HOSPITAL_COMMUNITY): Payer: Self-pay

## 2020-11-26 ENCOUNTER — Telehealth (HOSPITAL_COMMUNITY): Payer: Self-pay

## 2020-11-26 ENCOUNTER — Other Ambulatory Visit (HOSPITAL_BASED_OUTPATIENT_CLINIC_OR_DEPARTMENT_OTHER): Payer: Self-pay

## 2020-11-26 NOTE — Telephone Encounter (Signed)
Transitions of Care Pharmacy  ° °Call attempted for a pharmacy transitions of care follow-up. HIPAA appropriate voicemail was left with call back information provided.  ° °Call attempt #1. Will follow-up in 2-3 days.  °  °

## 2020-11-27 ENCOUNTER — Telehealth (HOSPITAL_COMMUNITY): Payer: Self-pay

## 2020-11-27 ENCOUNTER — Ambulatory Visit: Payer: Medicare PPO | Admitting: Family Medicine

## 2020-11-27 ENCOUNTER — Other Ambulatory Visit: Payer: Self-pay

## 2020-11-27 DIAGNOSIS — I739 Peripheral vascular disease, unspecified: Secondary | ICD-10-CM

## 2020-11-27 NOTE — Telephone Encounter (Signed)
Transitions of Care Pharmacy   Call attempted for a pharmacy transitions of care follow-up. HIPAA appropriate voicemail was left with call back information provided.   Call attempt #2. Will follow-up in 2-3 days.    

## 2020-11-27 NOTE — Chronic Care Management (AMB) (Signed)
  Care Management   Outreach Note  11/27/2020 Name: Rodney [name] MRN: [id number] DOB: 08-04-1950  Referred by: [name], MD Reason for referral : Care Coordination (Initial outreach to schedule referral with [facility] )   A second unsuccessful telephone outreach was attempted today. The patient was referred to the case management team for assistance with care management and care coordination.   Follow Up Plan: A HIPAA compliant phone message was left for the patient providing contact information and requesting a return call.  If patient returns call to provider office, please advise to call [name] Care Management Care Guide Calissa Swenor at [place], [place] Management  Direct Dial: [phone]

## 2020-11-28 ENCOUNTER — Other Ambulatory Visit (HOSPITAL_COMMUNITY): Payer: Self-pay

## 2020-11-28 ENCOUNTER — Ambulatory Visit: Payer: Medicare PPO

## 2020-11-28 ENCOUNTER — Telehealth (HOSPITAL_COMMUNITY): Payer: Self-pay

## 2020-11-28 NOTE — Telephone Encounter (Signed)
Pharmacy Transitions of Care Follow-up Telephone Call  Date of discharge: 11/21/20  Discharge Diagnosis: HFrEF  How have you been since you were released from the hospital?  Patient has been doing well. Was supposed to be discharged on Plavix but did not receive it at discharge. Called MD office to ask about Plavix and found it was sent to [facility]. Had to leave an additional message on patient's home phone to tell them the Rx was at [facility].  Medication changes made at discharge:      START taking: CertaVite/Antioxidants  feeding supplement (NEPRO CARB STEADY)  losartan (COZAAR)  multivitamin with minerals  nicotine (NICODERM CQ - dosed in mg/24 hours)  senna (SENOKOT)  umeclidinium bromide (INCRUSE ELLIPTA)  zinc sulfate   CHANGE how you take: atorvastatin (LIPITOR)  carvedilol (COREG)  enoxaparin (LOVENOX)  warfarin (COUMADIN)   STOP taking: nutrition supplement (JUVEN) Pack  oxyCODONE 5 MG immediate release tablet (Oxy IR/ROXICODONE)   Medication changes verified by the patient? Yes    Medication Accessibility:  Home Pharmacy:  Patient uses [facility]  Was the patient provided with refills on discharged medications? Yes   Have all prescriptions been transferred from [facility] to home pharmacy?  No, MD will send to [facility] at next visit.  Is the patient able to afford medications? Patient has [employer] Gold Plus   Medication Review:  CLOPIDOGREL (PLAVIX) Clopidogrel 75 mg once daily.  - Advised patient of medications to avoid (NSAIDs, ASA)  - Educated that Tylenol (acetaminophen) will be the preferred analgesic to prevent risk of bleeding  - Emphasized importance of monitoring for signs and symptoms of bleeding (abnormal bruising, prolonged bleeding, nose bleeds, bleeding from gums, discolored urine, black tarry stools)  - Advised patient to alert all providers of anticoagulation therapy prior to starting a new medication or having a procedure   WARFARIN - Please verify dosing  schedule and ensure they are scheduled to get INR checked at a clinic. Has been on Warfarin prior to discharge.  ENOXAPARIN - Final day of therapy is 11/26/20  Follow-up Appointments:  PCP Hospital f/u appt confirmed?  Scheduled to see Dr. Andria Frames on 12/02/20 @ Fam Med.   [facility] f/u appt confirmed?  Scheduled to see Dr. Sarajane Jews on 01/20/21 @ Cardiology.   If their condition worsens, is the pt aware to call PCP or go to the Emergency Dept.? Yes  Final Patient Assessment: Patient has follow up and knows to get refills at follow up visit

## 2020-12-02 ENCOUNTER — Ambulatory Visit: Payer: Medicare PPO | Admitting: Family Medicine

## 2020-12-02 NOTE — Chronic Care Management (AMB) (Signed)
  Care Management   Outreach Note  12/02/2020 Name: Rodney [name] MRN: [id number] DOB: 1950-12-02  Referred by: [name], MD Reason for referral : Care Coordination (Initial outreach to schedule referral with [facility] )   Third unsuccessful telephone outreach was attempted today. The patient was referred to the case management team for assistance with care management and care coordination. The patient's primary care provider has been notified of our unsuccessful attempts to make or maintain contact with the patient. The care management team is pleased to engage with this patient at any time in the future should he/she be interested in assistance from the care management team.   Follow Up Plan: We have been unable to make contact with the patient for follow up. The care management team is available to follow up with the patient after provider conversation with the patient regarding recommendation for care management engagement and subsequent re-referral to the care management team.   Julian Hy, [facility]  Direct Dial: [phone]

## 2020-12-04 ENCOUNTER — Encounter: Payer: Self-pay | Admitting: Physician Assistant

## 2020-12-04 ENCOUNTER — Ambulatory Visit (INDEPENDENT_AMBULATORY_CARE_PROVIDER_SITE_OTHER): Payer: Medicare PPO | Admitting: Physician Assistant

## 2020-12-04 DIAGNOSIS — M86071 Acute hematogenous osteomyelitis, right ankle and foot: Secondary | ICD-10-CM

## 2020-12-04 NOTE — Progress Notes (Signed)
Office Visit Note   Patient: Rodney [name]           Date of Birth: February 18, 1951           MRN: [id number] Visit Date: 12/04/2020              Requested by: [name], MD [address] [place],  [place] [address] PCP: [name], MD  Chief Complaint  Patient presents with   Right Foot - Follow-up    GTA      HPI: Patient presents today 3 weeks status post right great toe amputation.  He is overall doing well has been elevating his foot.  Has not been weightbearing.  Assessment & Plan: Visit Diagnoses: No diagnosis found.  Plan: Surgical sutures were removed today he is to continue to offload.  Follow-up in 2 weeks.  Follow-Up Instructions: No follow-ups on file.   Ortho Exam  Patient is alert, oriented, no adenopathy, well-dressed, normal affect, normal respiratory effort. Examination demonstrates well apposed wound edges he has quite a bit of dried blood which was cleaned off today.  No surrounding cellulitis no evidence of infection swelling is moderate  Imaging: No results found. No images are attached to the encounter.  Labs: Lab Results  Component Value Date   HGBA1C 6.1 10/14/2020   HGBA1C 6.5 (A) 07/03/2020   HGBA1C 7.5 (H) 04/03/2020   CRP 3.6 (H) 11/10/2020   REPTSTATUS 11/24/2020 FINAL 11/19/2020   CULT  11/19/2020    NO GROWTH 5 DAYS Performed at [facility], [place] [address] [place], [place] [address]      Lab Results  Component Value Date   ALBUMIN 2.4 (L) 11/19/2020   ALBUMIN 2.8 (L) 11/18/2020   ALBUMIN 2.4 (L) 11/14/2020    Lab Results  Component Value Date   MG 2.0 11/10/2020   MG 1.8 11/09/2020   MG 1.6 (L) 04/04/2020   No results found for: VD25OH  No results found for: PREALBUMIN CBC EXTENDED Latest Ref Rng & Units 11/21/2020 11/20/2020 11/19/2020  WBC 4.0 - 10.5 K/uL 7.2 7.4 8.7  RBC 4.22 - 5.81 MIL/uL 2.90(L) 2.85(L) 3.19(L)  HGB 13.0 - 17.0 g/dL 8.7(L) 8.7(L) 9.5(L)  HCT 39.0 - 52.0 % 27.0(L)  26.5(L) 31.4(L)  PLT 150 - 400 K/uL 303 251 287  NEUTROABS 1.7 - 7.7 K/uL - - -  LYMPHSABS 0.7 - 4.0 K/uL - - -     There is no height or weight on file to calculate BMI.  Orders:  No orders of the defined types were placed in this encounter.  No orders of the defined types were placed in this encounter.    Procedures: No procedures performed  Clinical Data: No additional findings.  ROS:  All other systems negative, except as noted in the HPI. Review of Systems  Objective: Vital Signs: There were no vitals taken for this visit.  Specialty Comments:  No specialty comments available.  PMFS History: Patient Active Problem List   Diagnosis Date Noted   Cellulitis 11/19/2020   Elevated troponin 11/19/2020   Bullae    Acute on chronic combined systolic (congestive) and diastolic (congestive) heart failure (HCC)    Peripheral neuropathy 11/11/2020   Open wound of right great toe    Supratherapeutic INR 11/10/2020   History of DVT (deep vein thrombosis) 11/10/2020   Osteomyelitis of right foot ([place]) 11/07/2020   Acute deep vein thrombosis (DVT) of iliac vein of right lower extremity ([place])  Acute on chronic anemia    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   Gangrene of toe of right foot ([place]) 08/04/2020   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   AKI (acute kidney injury) ([place]) 04/03/2020   CAD (coronary artery disease) 02/15/2020   CKD stage 3 due to type 2 diabetes mellitus ([place]) 02/15/2020   Long term (current) use of anticoagulants 12/29/2019   S/P BKA (below knee amputation) unilateral, left ([place]) 11/14/2019   High risk social situation 11/14/2019   AF (paroxysmal atrial fibrillation) ([place]) 10/03/2019   HFrEF (heart failure with reduced ejection fraction) ([place]) 09/28/2019   Moderate aortic stenosis 09/28/2019   Anemia in chronic illness 09/27/2019   Protein calorie malnutrition (HCC)    PVD (peripheral vascular disease) ([place])    Umbilical hernia 95/62/1308   Obesity  10/09/2015   Tobacco abuse 09/07/2013   DM (diabetes mellitus), type 2 with neurological complications ([place]) 65/78/4696   Hypercholesteremia 10/28/2010   ERECTILE DYSFUNCTION 05/22/2009   Essential hypertension 01/23/2009   Past Medical History:  Diagnosis Date   Aortic stenosis    moderate in 2022   Atrial fibrillation (HCC)    CHF (congestive heart failure) (HCC)    Coronary artery disease    Diabetes mellitus without complication (HCC)    HLD (hyperlipidemia)    Hypertension    Peripheral arterial disease ([place])     Family History  Problem Relation Age of Onset   Alcoholism Mother    Alcoholism Father     Past Surgical History:  Procedure Laterality Date   ABDOMINAL AORTOGRAM W/LOWER EXTREMITY N/A 08/05/2020   Procedure: ABDOMINAL AORTOGRAM W/LOWER EXTREMITY;  Surgeon: Marty Heck, MD;  Location: [place] [facility];  Service: Cardiovascular;  Laterality: N/A;   ABDOMINAL AORTOGRAM W/LOWER EXTREMITY N/A 11/13/2020   Procedure: ABDOMINAL AORTOGRAM W/LOWER EXTREMITY;  Surgeon: Cherre Robins, MD;  Location: [place] [facility];  Service: Cardiovascular;  Laterality: N/A;   AMPUTATION Left 09/28/2019   Procedure: AMPUTATION BELOW KNEE;  Surgeon: Newt Minion, MD;  Location: [place];  Service: Orthopedics;  Laterality: Left;   AMPUTATION Right 11/15/2020   Procedure: RIGHT GREAT TOE AMPUTATION;  Surgeon: Newt Minion, MD;  Location: [place];  Service: Orthopedics;  Laterality: Right;   CARDIAC CATHETERIZATION     CARDIOVERSION N/A 10/05/2019   Procedure: CARDIOVERSION;  Surgeon: Sanda Klein, MD;  Location: [facility] [facility];  Service: Cardiovascular;  Laterality: N/A;   FEMORAL-POPLITEAL BYPASS GRAFT Right 08/07/2020   Procedure: RIGHT FEMORAL TO BELOW KNEE POPLITEAL ARTERY BYPASS;  Surgeon: Waynetta Sandy, MD;  Location: [place];  Service: Vascular;  Laterality: Right;   LEFT HEART CATH AND CORONARY ANGIOGRAPHY N/A 10/03/2019   Procedure: LEFT HEART CATH AND CORONARY  ANGIOGRAPHY;  Surgeon: Lorretta Harp, MD;  Location: [place] [facility];  Service: Cardiovascular;  Laterality: N/A;   PERIPHERAL VASCULAR INTERVENTION Right 08/05/2020   Procedure: PERIPHERAL VASCULAR INTERVENTION;  Surgeon: Marty Heck, MD;  Location: [place] [facility];  Service: Cardiovascular;  Laterality: Right;  common Iliac   PERIPHERAL VASCULAR INTERVENTION Left 11/13/2020   Procedure: PERIPHERAL VASCULAR INTERVENTION;  Surgeon: Cherre Robins, MD;  Location: [place] [facility];  Service: Cardiovascular;  Laterality: Left;   PERIPHERAL VASCULAR INTERVENTION Right 11/14/2020   Procedure: PERIPHERAL VASCULAR INTERVENTION;  Surgeon: Marty Heck, MD;  Location: [place] [facility];  Service: Cardiovascular;  Laterality: Right;  POP/SFA STENT   TEE WITHOUT CARDIOVERSION N/A 10/05/2019   Procedure: TRANSESOPHAGEAL ECHOCARDIOGRAM (TEE);  Surgeon:  Croitoru, Dani Gobble, MD;  Location: [place] [facility];  Service: Cardiovascular;  Laterality: N/A;   Social History   Occupational History   Not on file  Tobacco Use   Smoking status: Every Day    Packs/day: 0.50    Years: 50.00    Pack years: 25.00    Types: Cigarettes   Smokeless tobacco: Never  Vaping Use   Vaping Use: Never used  Substance and Sexual Activity   Alcohol use: Yes    Alcohol/week: 6.0 standard drinks    Types: 6 Standard drinks or equivalent per week    Comment: 11/07/20 - states he has not drank in 6 months   Drug use: No   Sexual activity: Yes    Partners: Female    Comment: monagamous stable relationship

## 2020-12-09 ENCOUNTER — Ambulatory Visit (INDEPENDENT_AMBULATORY_CARE_PROVIDER_SITE_OTHER): Payer: Medicare PPO | Admitting: Family Medicine

## 2020-12-09 ENCOUNTER — Encounter: Payer: Self-pay | Admitting: Family Medicine

## 2020-12-09 ENCOUNTER — Other Ambulatory Visit: Payer: Self-pay

## 2020-12-09 VITALS — BP 102/52 | HR 63 | Ht 72.0 in | Wt 217.2 lb

## 2020-12-09 DIAGNOSIS — Z7901 Long term (current) use of anticoagulants: Secondary | ICD-10-CM

## 2020-12-09 DIAGNOSIS — E1122 Type 2 diabetes mellitus with diabetic chronic kidney disease: Secondary | ICD-10-CM

## 2020-12-09 DIAGNOSIS — I82421 Acute embolism and thrombosis of right iliac vein: Secondary | ICD-10-CM | POA: Diagnosis not present

## 2020-12-09 DIAGNOSIS — I480 Paroxysmal atrial fibrillation: Secondary | ICD-10-CM

## 2020-12-09 DIAGNOSIS — J441 Chronic obstructive pulmonary disease with (acute) exacerbation: Secondary | ICD-10-CM

## 2020-12-09 DIAGNOSIS — I5020 Unspecified systolic (congestive) heart failure: Secondary | ICD-10-CM

## 2020-12-09 DIAGNOSIS — D649 Anemia, unspecified: Secondary | ICD-10-CM | POA: Diagnosis not present

## 2020-12-09 DIAGNOSIS — J449 Chronic obstructive pulmonary disease, unspecified: Secondary | ICD-10-CM | POA: Insufficient documentation

## 2020-12-09 DIAGNOSIS — N183 Chronic kidney disease, stage 3 unspecified: Secondary | ICD-10-CM

## 2020-12-09 DIAGNOSIS — I10 Essential (primary) hypertension: Secondary | ICD-10-CM

## 2020-12-09 DIAGNOSIS — Z720 Tobacco use: Secondary | ICD-10-CM | POA: Diagnosis not present

## 2020-12-09 DIAGNOSIS — E875 Hyperkalemia: Secondary | ICD-10-CM

## 2020-12-09 HISTORY — DX: Chronic obstructive pulmonary disease with (acute) exacerbation: J44.1

## 2020-12-09 LAB — POCT INR: INR: 1.4 — AB (ref 2.0–3.0)

## 2020-12-09 MED ORDER — ALBUTEROL SULFATE HFA 108 (90 BASE) MCG/ACT IN AERS: 2.0000 | INHALATION_SPRAY | Freq: Four times a day (QID) | RESPIRATORY_TRACT | 0 refills | Status: DC | PRN

## 2020-12-09 MED ORDER — UMECLIDINIUM BROMIDE 62.5 MCG/INH IN AEPB: 1.0000 | INHALATION_SPRAY | Freq: Every day | RESPIRATORY_TRACT | 0 refills | Status: DC

## 2020-12-09 MED ORDER — NICOTINE 14 MG/24HR TD PT24: 14.0000 mg | MEDICATED_PATCH | Freq: Every day | TRANSDERMAL | 0 refills | Status: DC

## 2020-12-09 NOTE — Patient Instructions (Addendum)
For your breathing, use the dry powder inhaler every day - Incruse elipta.  It keeps your breathing good. The second inhaler is albuterol - use only when you are short of breath.   I sent in a prescription for the nictotine patch.   All meds were sent to [facility] See me in one month.

## 2020-12-10 ENCOUNTER — Encounter: Payer: Self-pay | Admitting: Family Medicine

## 2020-12-10 DIAGNOSIS — E875 Hyperkalemia: Secondary | ICD-10-CM | POA: Insufficient documentation

## 2020-12-10 LAB — CBC
Hematocrit: 34.2 % — ABNORMAL LOW (ref 37.5–51.0)
Hemoglobin: 10.9 g/dL — ABNORMAL LOW (ref 13.0–17.7)
MCH: 28.8 pg (ref 26.6–33.0)
MCHC: 31.9 g/dL (ref 31.5–35.7)
MCV: 91 fL (ref 79–97)
Platelets: 324 10*3/uL (ref 150–450)
RBC: 3.78 x10E6/uL — ABNORMAL LOW (ref 4.14–5.80)
RDW: 13.4 % (ref 11.6–15.4)
WBC: 6.4 10*3/uL (ref 3.4–10.8)

## 2020-12-10 LAB — BASIC METABOLIC PANEL
BUN/Creatinine Ratio: 22 (ref 10–24)
BUN: 26 mg/dL (ref 8–27)
CO2: 25 mmol/L (ref 20–29)
Calcium: 9.4 mg/dL (ref 8.6–10.2)
Chloride: 105 mmol/L (ref 96–106)
Creatinine, Ser: 1.2 mg/dL (ref 0.76–1.27)
Glucose: 133 mg/dL — ABNORMAL HIGH (ref 65–99)
Potassium: 5.5 mmol/L — ABNORMAL HIGH (ref 3.5–5.2)
Sodium: 144 mmol/L (ref 134–144)
eGFR: 66 mL/min/{1.73_m2} (ref >59)

## 2020-12-10 NOTE — Assessment & Plan Note (Signed)
Hyperkalemia on labs.  No K supplement.  Called and had stop ARB.

## 2020-12-10 NOTE — Progress Notes (Signed)
    SUBJECTIVE:   CHIEF COMPLAINT / HPI:   Hospital FU: multiple issues Anemia, no further bleeding noted S/P Right great toe amputation.  Healing well.  Sees Dr. Sharol Given this week. COPD.  Confused about how to use inhalers.  Not using controler regularly HFrEF.  Pees all the time.  On lasix and low dose beta blocker and ARB.  Low BP is limiting pushing these typical HFrEF drugs Hypotension.  Denies falls or light headed spells. CKD Note weight change.  Has had nice diuresis.  Need to check renal function. Smoking less than 1/2 ppd.  Wants to quit altogether.  Was pleased with use of nicotine patch in the hospital    OBJECTIVE:   BP (!) 102/52   Pulse 63   Ht 6' (1.829 m)   Wt 217 lb 3.2 oz (98.5 kg)   SpO2 95%   BMI 29.46 kg/m   VS and wt noted.   Lungs clear Cardiac RRR without m or g Ext has compression sock on.  Trace edema. Pink palms and sclera  ASSESSMENT/PLAN:   HFrEF (heart failure with reduced ejection fraction) (HCC) Seems at dry weight.  Due to hyperkalemia and low BP, stop ARB.  Continue low dose beta blocker.    Acute on chronic anemia On recheck, Hgb is nicely improving.  AF (paroxysmal atrial fibrillation) (HCC) Seems to be in sinus.  INR is subtherapeutic.  Increased coumadin dose.  CKD stage 3 due to type 2 diabetes mellitus (HCC) Recheck creat is nicely stable.  Hyperkalemia Hyperkalemia on labs.  No K supplement.  Called and had stop ARB.  COPD (chronic obstructive pulmonary disease) ([place]) Educated on proper use of inhalers.     Rodney Resides, MD [place]

## 2020-12-10 NOTE — Assessment & Plan Note (Signed)
Recheck creat is nicely stable.

## 2020-12-10 NOTE — Assessment & Plan Note (Signed)
Seems to be in sinus.  INR is subtherapeutic.  Increased coumadin dose.

## 2020-12-10 NOTE — Assessment & Plan Note (Signed)
On recheck, Hgb is nicely improving.

## 2020-12-10 NOTE — Assessment & Plan Note (Signed)
Seems at dry weight.  Due to hyperkalemia and low BP, stop ARB.  Continue low dose beta blocker.

## 2020-12-10 NOTE — Assessment & Plan Note (Signed)
Educated on proper use of inhalers.

## 2020-12-11 ENCOUNTER — Other Ambulatory Visit: Payer: Self-pay

## 2020-12-11 MED ORDER — WARFARIN SODIUM 5 MG PO TABS: ORAL_TABLET | ORAL | 3 refills | Status: DC

## 2020-12-11 NOTE — Telephone Encounter (Signed)
[name], patient's significant other, calls nurse line regarding concerns with how patient should be taking Coumadin. Rx was sent for patient to take 7.5 mg on M, W, F and 5 mg on Tues, Thurs, Saturday, Sunday.   Patient will also need refills, as previous rx was sent to the [facility] pharmacy (only dispenses after hospital discharge).   [name] is also inquiring about patient having resumption of home health PT. Called [facility]. Due to patient being hospitalized, he was discharged and they will need a new referral.   To PCP  Talbot Grumbling, RN

## 2020-12-17 ENCOUNTER — Inpatient Hospital Stay (HOSPITAL_COMMUNITY): Admission: RE | Admit: 2020-12-17 | Payer: Medicare PPO | Source: Ambulatory Visit

## 2020-12-17 ENCOUNTER — Encounter (HOSPITAL_COMMUNITY): Payer: Medicare PPO

## 2020-12-19 ENCOUNTER — Telehealth: Payer: Self-pay | Admitting: Family Medicine

## 2020-12-19 ENCOUNTER — Ambulatory Visit (INDEPENDENT_AMBULATORY_CARE_PROVIDER_SITE_OTHER): Payer: Medicare PPO | Admitting: Physician Assistant

## 2020-12-19 ENCOUNTER — Other Ambulatory Visit (INDEPENDENT_AMBULATORY_CARE_PROVIDER_SITE_OTHER): Payer: Medicare PPO

## 2020-12-19 ENCOUNTER — Encounter: Payer: Self-pay | Admitting: Orthopedic Surgery

## 2020-12-19 ENCOUNTER — Other Ambulatory Visit: Payer: Self-pay

## 2020-12-19 DIAGNOSIS — I480 Paroxysmal atrial fibrillation: Secondary | ICD-10-CM | POA: Diagnosis not present

## 2020-12-19 DIAGNOSIS — I739 Peripheral vascular disease, unspecified: Secondary | ICD-10-CM

## 2020-12-19 DIAGNOSIS — Z7901 Long term (current) use of anticoagulants: Secondary | ICD-10-CM

## 2020-12-19 DIAGNOSIS — Z89512 Acquired absence of left leg below knee: Secondary | ICD-10-CM

## 2020-12-19 LAB — POCT INR: INR: 1.3 — AB (ref 2.0–3.0)

## 2020-12-19 NOTE — Telephone Encounter (Signed)
Wife is in our office today with husband who has lab work to be done.  Wife is asking if you have contacted [facility] regarding husband having physical therapy.  Please call her Thanks.

## 2020-12-19 NOTE — Progress Notes (Signed)
Office Visit Note   Patient: Rodney [name]           Date of Birth: 07/18/1950           MRN: [id number] Visit Date: 12/19/2020              Requested by: [name], MD [address] [place],  [place] [address] PCP: [name], MD  Chief Complaint  Patient presents with   Other    Right foot f/u GT amp      HPI: Patient presents today he is 1 month status post right great toe amputation.  He has been wearing his compression sock.  Overall he feels he is improving  Assessment & Plan: Visit Diagnoses: No diagnosis found.  Plan: Continue with daily cleansing.  Follow-up in 2 weeks.  Continue using the sock  Follow-Up Instructions: No follow-ups on file.   Ortho Exam  Patient is alert, oriented, no adenopathy, well-dressed, normal affect, normal respiratory effort. Examination demonstrates slight wound dehiscence but this is not deep.  Fibrinous tissue was debrided to healthy bleeding tissue.  There is no surrounding cellulitis no ascending cellulitis.  No foul odor.  He does have scabbing throughout his skin secondary to venous insufficiency has 1 small area that he pulled off a scab and had some bleeding.  He does not have any cellulitis in his leg.  Swelling is well controlled  Imaging: No results found. No images are attached to the encounter.  Labs: Lab Results  Component Value Date   HGBA1C 6.1 10/14/2020   HGBA1C 6.5 (A) 07/03/2020   HGBA1C 7.5 (H) 04/03/2020   CRP 3.6 (H) 11/10/2020   REPTSTATUS 11/24/2020 FINAL 11/19/2020   CULT  11/19/2020    NO GROWTH 5 DAYS Performed at [facility], [place] [address] [place], [place] [address]      Lab Results  Component Value Date   ALBUMIN 2.4 (L) 11/19/2020   ALBUMIN 2.8 (L) 11/18/2020   ALBUMIN 2.4 (L) 11/14/2020    Lab Results  Component Value Date   MG 2.0 11/10/2020   MG 1.8 11/09/2020   MG 1.6 (L) 04/04/2020   No results found for: VD25OH  No results found for:  PREALBUMIN CBC EXTENDED Latest Ref Rng & Units 12/09/2020 11/21/2020 11/20/2020  WBC 3.4 - 10.8 x10E3/uL 6.4 7.2 7.4  RBC 4.14 - 5.80 x10E6/uL 3.78(L) 2.90(L) 2.85(L)  HGB 13.0 - 17.7 g/dL 10.9(L) 8.7(L) 8.7(L)  HCT 37.5 - 51.0 % 34.2(L) 27.0(L) 26.5(L)  PLT 150 - 450 x10E3/uL 324 303 251  NEUTROABS 1.7 - 7.7 K/uL - - -  LYMPHSABS 0.7 - 4.0 K/uL - - -     There is no height or weight on file to calculate BMI.  Orders:  No orders of the defined types were placed in this encounter.  No orders of the defined types were placed in this encounter.    Procedures: No procedures performed  Clinical Data: No additional findings.  ROS:  All other systems negative, except as noted in the HPI. Review of Systems  Objective: Vital Signs: There were no vitals taken for this visit.  Specialty Comments:  No specialty comments available.  PMFS History: Patient Active Problem List   Diagnosis Date Noted   Hyperkalemia 12/10/2020   COPD (chronic obstructive pulmonary disease) ([place]) 12/09/2020   Peripheral neuropathy 11/11/2020   Open wound of right great toe    Supratherapeutic INR 11/10/2020   History of DVT (deep  vein thrombosis) 11/10/2020   Acute deep vein thrombosis (DVT) of iliac vein of right lower extremity (HCC)    Acute on chronic anemia    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   CAD (coronary artery disease) 02/15/2020   CKD stage 3 due to type 2 diabetes mellitus ([place]) 02/15/2020   Long term (current) use of anticoagulants 12/29/2019   S/P BKA (below knee amputation) unilateral, left ([place]) 11/14/2019   High risk social situation 11/14/2019   AF (paroxysmal atrial fibrillation) ([place]) 10/03/2019   HFrEF (heart failure with reduced ejection fraction) ([place]) 09/28/2019   Moderate aortic stenosis 09/28/2019   Anemia in chronic illness 09/27/2019   Protein calorie malnutrition (HCC)    PVD (peripheral vascular disease) ([place])    Umbilical  hernia 04/54/0981   Obesity 10/09/2015   Tobacco abuse 09/07/2013   DM (diabetes mellitus), type 2 with neurological complications ([place]) 19/14/7829   Hypercholesteremia 10/28/2010   ERECTILE DYSFUNCTION 05/22/2009   Essential hypertension 01/23/2009   Past Medical History:  Diagnosis Date   Aortic stenosis    moderate in 2022   Atrial fibrillation (HCC)    CHF (congestive heart failure) ([place])    Coronary artery disease    Diabetes mellitus without complication (HCC)    HLD (hyperlipidemia)    Hypertension    Peripheral arterial disease ([place])     Family History  Problem Relation Age of Onset   Alcoholism Mother    Alcoholism Father     Past Surgical History:  Procedure Laterality Date   ABDOMINAL AORTOGRAM W/LOWER EXTREMITY N/A 08/05/2020   Procedure: ABDOMINAL AORTOGRAM W/LOWER EXTREMITY;  Surgeon: Marty Heck, MD;  Location: [facility] [facility];  Service: Cardiovascular;  Laterality: N/A;   ABDOMINAL AORTOGRAM W/LOWER EXTREMITY N/A 11/13/2020   Procedure: ABDOMINAL AORTOGRAM W/LOWER EXTREMITY;  Surgeon: Cherre Robins, MD;  Location: [place] [facility];  Service: Cardiovascular;  Laterality: N/A;   AMPUTATION Left 09/28/2019   Procedure: AMPUTATION BELOW KNEE;  Surgeon: Newt Minion, MD;  Location: [place];  Service: Orthopedics;  Laterality: Left;   AMPUTATION Right 11/15/2020   Procedure: RIGHT GREAT TOE AMPUTATION;  Surgeon: Newt Minion, MD;  Location: [place];  Service: Orthopedics;  Laterality: Right;   CARDIAC CATHETERIZATION     CARDIOVERSION N/A 10/05/2019   Procedure: CARDIOVERSION;  Surgeon: Sanda Klein, MD;  Location: [facility] [facility];  Service: Cardiovascular;  Laterality: N/A;   FEMORAL-POPLITEAL BYPASS GRAFT Right 08/07/2020   Procedure: RIGHT FEMORAL TO BELOW KNEE POPLITEAL ARTERY BYPASS;  Surgeon: Waynetta Sandy, MD;  Location: [place];  Service: Vascular;  Laterality: Right;   LEFT HEART CATH AND CORONARY ANGIOGRAPHY N/A 10/03/2019   Procedure:  LEFT HEART CATH AND CORONARY ANGIOGRAPHY;  Surgeon: Lorretta Harp, MD;  Location: [place] [facility];  Service: Cardiovascular;  Laterality: N/A;   PERIPHERAL VASCULAR INTERVENTION Right 08/05/2020   Procedure: PERIPHERAL VASCULAR INTERVENTION;  Surgeon: Marty Heck, MD;  Location: [place] [facility];  Service: Cardiovascular;  Laterality: Right;  common Iliac   PERIPHERAL VASCULAR INTERVENTION Left 11/13/2020   Procedure: PERIPHERAL VASCULAR INTERVENTION;  Surgeon: Cherre Robins, MD;  Location: [place] [facility];  Service: Cardiovascular;  Laterality: Left;   PERIPHERAL VASCULAR INTERVENTION Right 11/14/2020   Procedure: PERIPHERAL VASCULAR INTERVENTION;  Surgeon: Marty Heck, MD;  Location: [place] [facility];  Service: Cardiovascular;  Laterality: Right;  POP/SFA STENT   TEE WITHOUT CARDIOVERSION N/A 10/05/2019   Procedure: TRANSESOPHAGEAL ECHOCARDIOGRAM (  TEE);  Surgeon: Sanda Klein, MD;  Location: [place] [facility];  Service: Cardiovascular;  Laterality: N/A;   Social History   Occupational History   Not on file  Tobacco Use   Smoking status: Every Day    Packs/day: 0.50    Years: 50.00    Pack years: 25.00    Types: Cigarettes   Smokeless tobacco: Never  Vaping Use   Vaping Use: Never used  Substance and Sexual Activity   Alcohol use: Yes    Alcohol/week: 6.0 standard drinks    Types: 6 Standard drinks or equivalent per week    Comment: 11/07/20 - states he has not drank in 6 months   Drug use: No   Sexual activity: Yes    Partners: Female    Comment: monagamous stable relationship

## 2020-12-24 NOTE — Telephone Encounter (Signed)
Called.  Yes, I need to put in a referral.  They have used "[facility]" and the PT's name is Mark Bias.

## 2020-12-27 ENCOUNTER — Telehealth: Payer: Self-pay

## 2020-12-27 ENCOUNTER — Ambulatory Visit: Payer: Medicare PPO | Admitting: Pharmacist

## 2020-12-27 DIAGNOSIS — I5020 Unspecified systolic (congestive) heart failure: Secondary | ICD-10-CM | POA: Diagnosis not present

## 2020-12-27 DIAGNOSIS — I480 Paroxysmal atrial fibrillation: Secondary | ICD-10-CM | POA: Diagnosis not present

## 2020-12-27 DIAGNOSIS — E1122 Type 2 diabetes mellitus with diabetic chronic kidney disease: Secondary | ICD-10-CM | POA: Diagnosis not present

## 2020-12-27 DIAGNOSIS — Z4781 Encounter for orthopedic aftercare following surgical amputation: Secondary | ICD-10-CM | POA: Diagnosis not present

## 2020-12-27 DIAGNOSIS — D631 Anemia in chronic kidney disease: Secondary | ICD-10-CM | POA: Diagnosis not present

## 2020-12-27 DIAGNOSIS — I110 Hypertensive heart disease with heart failure: Secondary | ICD-10-CM | POA: Diagnosis not present

## 2020-12-27 DIAGNOSIS — J449 Chronic obstructive pulmonary disease, unspecified: Secondary | ICD-10-CM | POA: Diagnosis not present

## 2020-12-27 DIAGNOSIS — N183 Chronic kidney disease, stage 3 unspecified: Secondary | ICD-10-CM | POA: Diagnosis not present

## 2020-12-27 DIAGNOSIS — I350 Nonrheumatic aortic (valve) stenosis: Secondary | ICD-10-CM | POA: Diagnosis not present

## 2020-12-27 NOTE — Telephone Encounter (Signed)
Rodney [name] from [name] for PT verbal orders as follows:  1 time(s) weekly for 9 week(s).  Also requesting orders for Nursing Eval.  Verbal orders given per [facility] protocol  Talbot Grumbling, RN

## 2020-12-31 ENCOUNTER — Ambulatory Visit: Payer: Medicare PPO

## 2020-12-31 NOTE — Telephone Encounter (Signed)
Noted and agree. 

## 2021-01-01 ENCOUNTER — Ambulatory Visit: Payer: Medicare PPO

## 2021-01-02 DIAGNOSIS — E1122 Type 2 diabetes mellitus with diabetic chronic kidney disease: Secondary | ICD-10-CM | POA: Diagnosis not present

## 2021-01-02 DIAGNOSIS — I350 Nonrheumatic aortic (valve) stenosis: Secondary | ICD-10-CM | POA: Diagnosis not present

## 2021-01-02 DIAGNOSIS — Z4781 Encounter for orthopedic aftercare following surgical amputation: Secondary | ICD-10-CM | POA: Diagnosis not present

## 2021-01-02 DIAGNOSIS — I5020 Unspecified systolic (congestive) heart failure: Secondary | ICD-10-CM | POA: Diagnosis not present

## 2021-01-02 DIAGNOSIS — I480 Paroxysmal atrial fibrillation: Secondary | ICD-10-CM | POA: Diagnosis not present

## 2021-01-02 DIAGNOSIS — I110 Hypertensive heart disease with heart failure: Secondary | ICD-10-CM | POA: Diagnosis not present

## 2021-01-02 DIAGNOSIS — D631 Anemia in chronic kidney disease: Secondary | ICD-10-CM | POA: Diagnosis not present

## 2021-01-02 DIAGNOSIS — J449 Chronic obstructive pulmonary disease, unspecified: Secondary | ICD-10-CM | POA: Diagnosis not present

## 2021-01-02 DIAGNOSIS — N183 Chronic kidney disease, stage 3 unspecified: Secondary | ICD-10-CM | POA: Diagnosis not present

## 2021-01-03 ENCOUNTER — Ambulatory Visit: Payer: Medicare PPO

## 2021-01-03 ENCOUNTER — Ambulatory Visit: Payer: Medicare PPO | Admitting: Physician Assistant

## 2021-01-08 DIAGNOSIS — I350 Nonrheumatic aortic (valve) stenosis: Secondary | ICD-10-CM | POA: Diagnosis not present

## 2021-01-08 DIAGNOSIS — Z4781 Encounter for orthopedic aftercare following surgical amputation: Secondary | ICD-10-CM | POA: Diagnosis not present

## 2021-01-08 DIAGNOSIS — D631 Anemia in chronic kidney disease: Secondary | ICD-10-CM | POA: Diagnosis not present

## 2021-01-08 DIAGNOSIS — I5020 Unspecified systolic (congestive) heart failure: Secondary | ICD-10-CM | POA: Diagnosis not present

## 2021-01-08 DIAGNOSIS — J449 Chronic obstructive pulmonary disease, unspecified: Secondary | ICD-10-CM | POA: Diagnosis not present

## 2021-01-08 DIAGNOSIS — E1122 Type 2 diabetes mellitus with diabetic chronic kidney disease: Secondary | ICD-10-CM | POA: Diagnosis not present

## 2021-01-08 DIAGNOSIS — I480 Paroxysmal atrial fibrillation: Secondary | ICD-10-CM | POA: Diagnosis not present

## 2021-01-08 DIAGNOSIS — I110 Hypertensive heart disease with heart failure: Secondary | ICD-10-CM | POA: Diagnosis not present

## 2021-01-08 DIAGNOSIS — N183 Chronic kidney disease, stage 3 unspecified: Secondary | ICD-10-CM | POA: Diagnosis not present

## 2021-01-09 ENCOUNTER — Telehealth: Payer: Self-pay

## 2021-01-09 NOTE — Telephone Encounter (Signed)
Noted and agree. 

## 2021-01-09 NOTE — Telephone Encounter (Signed)
Darlene from [facility] calls nurse line requesting plan of care orders for skilled nursing for wound care.   Provided verbal orders per protocol.   Talbot Grumbling, RN

## 2021-01-12 NOTE — Progress Notes (Deleted)
Cardiology Office Note:    Date:  01/12/2021   ID:  [name], DOB 05/20/1950, MRN [id number]  PCP:  [name], MD  Cardiologist:  Evalina Field, MD  Electrophysiologist:  None   Referring MD: [name], MD   Chief Complaint: hospital follow-up of CHF  History of Present Illness:    Rodney [name] is a 70 y.o. 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 Audie Box and presents today for hospital follow-up of CHF.   Patient was first seen by Dr. Audie Box during admission in 09/2019. He was admitted for sepsis secondary to left foot with gangrene and necrotizing fasciitis. Ultimately required transtibial amputation. He developed post-op atrial fibrillation for which Cardiology was consulted. Echo showed LVEF of 35-40% with global hypokinesis and moderate AS. Patient underwent R/LHC on 10/03/2019  which showed CTO of RCA with faint collaterals and otherwise mild non-obstructive disease. LVEDP was markedly elevated. Medical therapy was recommended. He was diuresed and started on GDMT. Then, underwent successful TEE/DCCV on 10/04/2020. At follow-up visit with Dr. Audie Box in 10/2019, he was noted to be back in atrial fibrillation and was not taking any of his cardiac medications including Eliquis, Amiodarone, Coreg, and Losartan due to confusion about what he should be taking after being  discharged from SNF. Medications were restarted and he ultimately converted back to sinus rhythm. He was eventually switched to Coumadin due to cost. Patient was last seen by Dr. Audie Box in 06/2020 at which time he was maintaining sinus rhythm and was overall doing well from a cardiac standpoint.   Since this visit, he has been admitted multiple times for right lower extremity cellulitis and critical limb ischemia. During admission in 07/2020, he underwent peripheral angiography and was found to have high-grade stenosis of the right common iliac as well as an occluded above-knee popliteal artery which was not amenable to revascularization. He underwent stenting of right common iliac lesion on 08/05/2020 and then underwent right superficial femoral artery to below knee popliteal artery bypass on 08/07/2020. Readmitted in 10/2020 with critical limb ischemia and osteomyelitis of right great toe. He was found to have an occluded right fem-pop graft. He underwent angioplasty and stent placement of the right SFA to below-knee popliteal bypass graft on 11/14/2020 and then amputation of right toe on 11/15/2020. During last admission in 10/2020, he did report some worsening shortness of breath over the prior week after PCP had adjusted his GDMT and reduced his diuretics due to soft BP. High-sensitivity troponin was elevated and peaked at 1,462. BNP mildly elevated at 198. Echo showed LVEF of 50-55% with normal wall motion, grade 2 diastolic dysfunction, and moderate AS. Troponin elevation felt to be due to demand ischemia in setting of recent surgery and infection. He was diuresed and GDMT was adjusted.   Patient presents today for follow-up.   CAD - LHC in 09/2019 showed CTO of RCA with otherwise non-obstructive  disease.  - No angina.  - On Plavix 88m daily for PAD. - Continue beta-blocker and high-intensity statin.   Chronic Combined CHF - LVEF as low as 35-40% in 09/2019. Most recent Echo in 10/2020 showed LVEF of  50-55% with normal wall motion, grade 2 diastolic dysfunction, and moderate AS. - Euvolemic on exam.  - Continue Lasix 448mdaily as needed for weight gain and edema.  - Continue Coreg 3.12575mwice daily.  - Previously on Losartan but stopped due to hyperkalemia.  - Discussed importance of daily weights and sodium/fluid restrictions.   Persistent Atrial Fibrillation - S/p TEE/DCCV in 09/2019 with return of atrial fibrillation. But converted back to sinus rhythm after being started on Amiodarone.  - Maintaining sinus rhythm. *** - Continue Amiodarone 200m54mily.  - Continue Coreg 3.125mg62mce daily.  - Continue chronic anticoagulation with Coumadin.   Moderate Aortic Stenosis  - Noted on Echo in 10/2020.  - Will need continued routine monitoring as outpatient. Repeat Echo in 1-2 years.   PAD - S/p s/p right SFA to below knee popliteal bypass in 07/2020 with subsequent stenting of occluded graft and right toe amputation in 10/2020. Also s/p left below knee amputation in 09/2019 due to gangrene and necrotizing fasciitis. Most recent dopplers in 12/2020 showed patent in-graft stent.   - Continue Plavix and statin.  - Followed by Vascular Surgery.    Hypertension - BP well controlled.  - Continue low dose Coreg as above.  Hyperlipidemia - Lipid panel in 07/2020: Total Cholesterol 122, Triglycerides 98, HDL 28, LDL 74.  - LDL goal <70 given CAD. - Continue Lipitor 80mg 28my.  - Zetia ***  Type 2 Diabetes Mellitus - On Metformin and Insulin. - Management per PCP.  CKD Stage III - Baseline creatinine around 1.1 to 1.2.  - Stable on last check in 11/2020.    Past Medical History:  Diagnosis Date   Aortic stenosis    moderate in 2022   Atrial fibrillation (HCC)    CHF (congestive heart failure) (HCC)  [place] artery disease    Diabetes mellitus without complication (HCC)    HLD (hyperlipidemia)    Hypertension    Peripheral arterial disease (HCC)  [place]:   Procedure Laterality Date   ABDOMINAL AORTOGRAM W/LOWER EXTREMITY N/A 08/05/2020   Procedure: ABDOMINAL AORTOGRAM W/LOWER EXTREMITY;  Surgeon: Clark,Marty Heck [facility];  Service: Cardiovascular;  Laterality: N/A;   ABDOMINAL AORTOGRAM W/LOWER EXTREMITY N/A 11/13/2020   Procedure: ABDOMINAL AORTOGRAM W/LOWER EXTREMITY;  Surgeon: HawkenCherre Robins [facility];  Service: Cardiovascular;  Laterality: N/A;   AMPUTATION Left 09/28/2019   Procedure: AMPUTATION BELOW KNEE;  Surgeon: Duda, Newt Minion [facility];  Laterality: Left;   AMPUTATION Right 11/15/2020   Procedure: RIGHT GREAT TOE AMPUTATION;  Surgeon: Duda, Newt Minion [facility];  Laterality: Right;   CARDIAC CATHETERIZATION     CARDIOVERSION N/A 10/05/2019   Procedure: CARDIOVERSION;  Surgeon: CroitoSanda Klein [facility];  Service: Cardiovascular;  Laterality: N/A;   FEMORAL-POPLITEAL BYPASS GRAFT Right 08/07/2020   Procedure: RIGHT FEMORAL TO BELOW KNEE POPLITEAL ARTERY BYPASS;  Surgeon: Cain, Waynetta Sandy [facility];  Laterality: Right;   LEFT HEART CATH AND CORONARY ANGIOGRAPHY N/A 10/03/2019   Procedure: LEFT HEART CATH AND CORONARY ANGIOGRAPHY;  Surgeon: Berry,Quay Burow  J, MD;  Location: [facility];  Service: Cardiovascular;  Laterality: N/A;   PERIPHERAL VASCULAR INTERVENTION Right 08/05/2020   Procedure: PERIPHERAL VASCULAR INTERVENTION;  Surgeon: Marty Heck, MD;  Location: [facility];  Service: Cardiovascular;  Laterality: Right;  common Iliac   PERIPHERAL VASCULAR INTERVENTION Left 11/13/2020   Procedure: PERIPHERAL VASCULAR INTERVENTION;  Surgeon: Cherre Robins, MD;  Location: [facility];  Service: Cardiovascular;  Laterality: Left;   PERIPHERAL VASCULAR INTERVENTION Right 11/14/2020   Procedure: PERIPHERAL VASCULAR INTERVENTION;  Surgeon:  Marty Heck, MD;  Location: [facility];  Service: Cardiovascular;  Laterality: Right;  POP/SFA STENT   TEE WITHOUT CARDIOVERSION N/A 10/05/2019   Procedure: TRANSESOPHAGEAL ECHOCARDIOGRAM (TEE);  Surgeon: Sanda Klein, MD;  Location: [facility];  Service: Cardiovascular;  Laterality: N/A;    Current Medications: No outpatient medications have been marked as taking for the 01/20/21 encounter (Appointment) with Darreld Mclean, PA-C.     Allergies:   Patient has no known allergies.   Social History   Socioeconomic History   Marital status: Widowed    Spouse name: Not on file   Number of children: Not on file   Years of education: Not on file   Highest education level: Not on file  Occupational History   Not on file  Tobacco Use   Smoking status: Every Day    Packs/day: 0.50    Years: 50.00    Pack years: 25.00    Types: Cigarettes   Smokeless tobacco: Never  Vaping Use   Vaping Use: Never used  Substance and Sexual Activity   Alcohol use: Yes    Alcohol/week: 6.0 standard drinks    Types: 6 Standard drinks or equivalent per week    Comment: 11/07/20 - states he has not drank in 6 months   Drug use: No   Sexual activity: Yes    Partners: Female    Comment: monagamous stable relationship  Other Topics Concern   Not on file  Social History Narrative   Not on file   Social Determinants of Health   Financial Resource Strain: Not on file  Food Insecurity: Not on file  Transportation Needs: Not on file  Physical Activity: Not on file  Stress: Not on file  Social Connections: Not on file     Family History: The patient's family history includes Alcoholism in his father and mother.  ROS:   Please see the history of present illness.     EKGs/Labs/Other Studies Reviewed:    The following studies were reviewed today:  Left Cardiac Catheterization 10/03/2019: Prox RCA lesion is 100% stenosed.  Impressions: Rodney [name] has mild nonobstructive  disease in his left system including the AV groove circumflex and LAD with a chronically occluded RCA, faint grade 1 left to right collaterals and a 30 mm peak to peak gradient across his aortic valve.  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  His LVEDP was 35.  At this point, I would recommend medical therapy.  The sheath was removed and a TR band was placed on the right wrist to achieve patent hemostasis.  Patient left lab in stable condition.  I have communicated these results to Dr. Audie Box .  Diagnostic Dominance: Right  _______________  Echocardiogram 11/19/2020: Impressions: 1. Left ventricular ejection fraction, by estimation, is 50 to 55%. The  left ventricle has low normal function. The left ventricle has no regional  wall motion abnormalities. The left ventricular  internal cavity size was  mildly dilated. Left ventricular  diastolic parameters are consistent with Grade II diastolic dysfunction  (pseudonormalization). Elevated left ventricular end-diastolic pressure.   2. Right ventricular systolic function is normal. The right ventricular  size is normal. There is normal pulmonary artery systolic pressure.   3. The mitral valve is normal in structure. Trivial mitral valve  regurgitation. No evidence of mitral stenosis. Moderate mitral annular  calcification.   4. The aortic valve is calcified. There is moderate calcification of the  aortic valve. Aortic valve regurgitation is trivial. Moderate aortic valve  stenosis. Aortic valve area, by VTI measures 0.73 cm. Aortic valve mean  gradient measures 35.0 mmHg.  Aortic valve Vmax measures 3.69 m/s.   5. Aortic dilatation noted. There is mild dilatation of the ascending  aorta, measuring 39 mm.   6. The inferior vena cava is dilated in size with >50% respiratory  variability, suggesting right atrial pressure of 8 mmHg.    EKG:  EKG not ordered today.   Recent Labs: 05/01/2020: TSH 5.320 11/10/2020: Magnesium  2.0 11/19/2020: ALT 12 11/21/2020: B Natriuretic Peptide 502.7 12/09/2020: BUN 26; Creatinine, Ser 1.20; Hemoglobin 10.9; Platelets 324; Potassium 5.5; Sodium 144  Recent Lipid Panel    Component Value Date/Time   CHOL 122 08/08/2020 0000   CHOL 140 02/21/2018 1650   TRIG 98 08/08/2020 0000   HDL 28 (L) 08/08/2020 0000   HDL 33 (L) 02/21/2018 1650   CHOLHDL 4.4 08/08/2020 0000   VLDL 20 08/08/2020 0000   LDLCALC 74 08/08/2020 0000   LDLCALC 79 02/21/2018 1650   LDLDIRECT 92 01/03/2014 1514    Physical Exam:    Vital Signs: There were no vitals taken for this visit.    Wt Readings from Last 3 Encounters:  12/09/20 217 lb 3.2 oz (98.5 kg)  11/18/20 242 lb 8.1 oz (110 kg)  11/17/20 243 lb 13.3 oz (110.6 kg)     General: 70 y.o. male in no acute distress. HEENT: Normocephalic and atraumatic. Sclera clear. EOMs intact. Neck: Supple. No carotid bruits. No JVD. Heart: *** RRR. Distinct S1 and S2. No murmurs, gallops, or rubs. Radial and distal pedal pulses 2+ and equal bilaterally. Lungs: No increased work of breathing. Clear to ausculation bilaterally. No wheezes, rhonchi, or rales.  Abdomen: Soft, non-distended, and non-tender to palpation. Bowel sounds present in all 4 quadrants.  MSK: Normal strength and tone for age. *** Extremities: No lower extremity edema.    Skin: Warm and dry. Neuro: Alert and oriented x3. No focal deficits. Psych: Normal affect. Responds appropriately.   Assessment:    No diagnosis found.  Plan:     Disposition: Follow up in ***   Medication Adjustments/Labs and Tests Ordered: Current medicines are reviewed at length with the patient today.  Concerns regarding medicines are outlined above.  No orders of the defined types were placed in this encounter.  No orders of the defined types were placed in this encounter.   There are no Patient Instructions on file for this visit.   Signed, Darreld Mclean, PA-C  01/12/2021 4:25 PM    [facility]

## 2021-01-14 ENCOUNTER — Other Ambulatory Visit: Payer: Self-pay

## 2021-01-14 ENCOUNTER — Ambulatory Visit (INDEPENDENT_AMBULATORY_CARE_PROVIDER_SITE_OTHER)
Admission: RE | Admit: 2021-01-14 | Discharge: 2021-01-14 | Disposition: A | Discharge status: Home / Self Care | Payer: Medicare PPO | Source: Ambulatory Visit | Attending: Vascular Surgery | Admitting: Vascular Surgery

## 2021-01-14 ENCOUNTER — Ambulatory Visit (HOSPITAL_COMMUNITY)
Admission: RE | Admit: 2021-01-14 | Discharge: 2021-01-14 | Disposition: A | Discharge status: Home / Self Care | Payer: Medicare PPO | Source: Ambulatory Visit | Attending: Vascular Surgery | Admitting: Vascular Surgery

## 2021-01-14 ENCOUNTER — Ambulatory Visit (INDEPENDENT_AMBULATORY_CARE_PROVIDER_SITE_OTHER): Payer: Medicare PPO | Admitting: Physician Assistant

## 2021-01-14 VITALS — BP 145/71 | HR 65 | Temp 98.1°F | Resp 20 | Ht 72.0 in

## 2021-01-14 DIAGNOSIS — I739 Peripheral vascular disease, unspecified: Secondary | ICD-10-CM

## 2021-01-14 NOTE — Progress Notes (Signed)
HISTORY AND PHYSICAL     CC:  follow up. Requesting Provider:  [name], MD  HPI: This is a 70 y.o. male who is here today for follow up for PAD.  He had a wound on his right great toe status post right common iliac artery stent on 08/05/2020 by Dr. Carlis Abbott.  He then underwent right SFA to BK popliteal artery bypass with non reversed GSV on 08/07/2020 by Dr. Donzetta Matters.   On 11/14/2020, he underwent angioplasty and stent placement in the right SFA to BK popliteal bypass on 11/14/2020 by Dr. Carlis Abbott. He subsequently underwent right great toe amputation on 11/15/2020 by Dr. Sharol Given.   Pt comes in today for follow up. He states his foot is numb but this is not any different.  He is trying to cut back on his smoking.    The pt is on a statin for cholesterol management.    The pt is not on an aspirin.    Other AC:  Plavix/coumadin The pt is on BB for hypertension.  The pt is have diabetes. Tobacco hx:  current    Past Medical History:  Diagnosis Date   Aortic stenosis    moderate in 2022   Atrial fibrillation (HCC)    CHF (congestive heart failure) (HCC)    Coronary artery disease    Diabetes mellitus without complication (HCC)    HLD (hyperlipidemia)    Hypertension    Peripheral arterial disease ([place])     Past Surgical History:  Procedure Laterality Date   ABDOMINAL AORTOGRAM W/LOWER EXTREMITY N/A 08/05/2020   Procedure: ABDOMINAL AORTOGRAM W/LOWER EXTREMITY;  Surgeon: Marty Heck, MD;  Location: [facility];  Service: Cardiovascular;  Laterality: N/A;   ABDOMINAL AORTOGRAM W/LOWER EXTREMITY N/A 11/13/2020   Procedure: ABDOMINAL AORTOGRAM W/LOWER EXTREMITY;  Surgeon: Cherre Robins, MD;  Location: [facility];  Service: Cardiovascular;  Laterality: N/A;   AMPUTATION Left 09/28/2019   Procedure: AMPUTATION BELOW KNEE;  Surgeon: Newt Minion, MD;  Location: [facility];  Service: Orthopedics;  Laterality: Left;   AMPUTATION Right 11/15/2020   Procedure: RIGHT GREAT TOE  AMPUTATION;  Surgeon: Newt Minion, MD;  Location: [facility];  Service: Orthopedics;  Laterality: Right;   CARDIAC CATHETERIZATION     CARDIOVERSION N/A 10/05/2019   Procedure: CARDIOVERSION;  Surgeon: Sanda Klein, MD;  Location: [facility];  Service: Cardiovascular;  Laterality: N/A;   FEMORAL-POPLITEAL BYPASS GRAFT Right 08/07/2020   Procedure: RIGHT FEMORAL TO BELOW KNEE POPLITEAL ARTERY BYPASS;  Surgeon: Waynetta Sandy, MD;  Location: [facility];  Service: Vascular;  Laterality: Right;   LEFT HEART CATH AND CORONARY ANGIOGRAPHY N/A 10/03/2019   Procedure: LEFT HEART CATH AND CORONARY ANGIOGRAPHY;  Surgeon: Lorretta Harp, MD;  Location: [facility];  Service: Cardiovascular;  Laterality: N/A;   PERIPHERAL VASCULAR INTERVENTION Right 08/05/2020   Procedure: PERIPHERAL VASCULAR INTERVENTION;  Surgeon: Marty Heck, MD;  Location: [facility];  Service: Cardiovascular;  Laterality: Right;  common Iliac   PERIPHERAL VASCULAR INTERVENTION Left 11/13/2020   Procedure: PERIPHERAL VASCULAR INTERVENTION;  Surgeon: Cherre Robins, MD;  Location: [facility];  Service: Cardiovascular;  Laterality: Left;   PERIPHERAL VASCULAR INTERVENTION Right 11/14/2020   Procedure: PERIPHERAL VASCULAR INTERVENTION;  Surgeon: Marty Heck, MD;  Location: [facility];  Service: Cardiovascular;  Laterality: Right;  POP/SFA STENT   TEE WITHOUT CARDIOVERSION N/A 10/05/2019   Procedure: TRANSESOPHAGEAL ECHOCARDIOGRAM (TEE);  Surgeon: Croitoru,  Dani Gobble, MD;  Location: [facility];  Service: Cardiovascular;  Laterality: N/A;    No Known Allergies  Current Outpatient Medications  Medication Sig Dispense Refill   acetaminophen (TYLENOL) 325 MG tablet Take 1-2 tablets (325-650 mg total) by mouth every 6 (six) hours as needed for mild pain (pain score 1-3 or temp > 100.5). 60 tablet 0   albuterol (VENTOLIN HFA) 108 (90 Base) MCG/ACT inhaler Inhale 2 puffs into the lungs every 6  (six) hours as needed for wheezing or shortness of breath. 8 g 0   amiodarone (PACERONE) 200 MG tablet Take 1 tablet (200 mg total) by mouth daily. 90 tablet 3   atorvastatin (LIPITOR) 80 MG tablet TAKE 1 TABLET EVERY DAY 90 tablet 3   Blood Glucose Monitoring Suppl (TRUE METRIX METER) DEVI Use to test blood sugar three times daily. 1 each 0   Blood Glucose Monitoring Suppl (TRUE METRIX METER) w/Device KIT USE AS DIRECTED 1 kit 0   carvedilol (COREG) 3.125 MG tablet Take 1 tablet (3.125 mg total) by mouth 2 (two) times daily with a meal. (Patient taking differently: Take 1.5625 mg by mouth 2 (two) times daily with a meal.) 60 tablet 1   clopidogrel (PLAVIX) 75 MG tablet Take 1 tablet (75 mg total) by mouth daily with breakfast. 30 tablet 0   furosemide (LASIX) 40 MG tablet Take 1 tablet (40 mg total) by mouth daily as needed for fluid (swelling). 30 tablet 1   gabapentin (NEURONTIN) 100 MG capsule Take 2 capsules (200 mg total) by mouth 3 (three) times daily. 30 capsule 0   glucose blood (RELION TRUE METRIX TEST STRIPS) test strip Use to test blood sugar three times per day. 300 each 3   insulin NPH-regular Human (NOVOLIN 70/30 RELION) (70-30) 100 UNIT/ML injection Inject 15-30 Units into the skin See admin instructions. Inject 30 units into the skin with breakfast and 15 units with supper     metFORMIN (GLUCOPHAGE) 1000 MG tablet Take 1 tablet (1,000 mg total) by mouth daily. 90 tablet 3   Multiple Vitamin (MULTIVITAMIN WITH MINERALS) TABS tablet Take 1 tablet by mouth daily. 30 tablet 0   nicotine (NICODERM CQ - DOSED IN MG/24 HOURS) 14 mg/24hr patch Place 1 patch (14 mg total) onto the skin daily. 28 patch 0   Nutritional Supplements (FEEDING SUPPLEMENT, NEPRO CARB STEADY,) LIQD Take 237 mLs by mouth 2 (two) times daily between meals.  0   TRUEplus Lancets 33G MISC Use to test blood sugar three times per day. 300 each 3   umeclidinium bromide (INCRUSE ELLIPTA) 62.5 MCG/INH AEPB Inhale 1 puff into  the lungs daily. 1 each 0   warfarin (COUMADIN) 5 MG tablet Take 7.41m (one and a half tablets) on Mondays, Wednesdays and Fridays; Take 525m(one tablet) on Tuesdays, Thursdays, Saturdays and Sundays. OR as directed by [facility] 100 tablet 3   No current facility-administered medications for this visit.    Family History  Problem Relation Age of Onset   Alcoholism Mother    Alcoholism Father     Social History   Socioeconomic History   Marital status: Widowed    Spouse name: Not on file   Number of children: Not on file   Years of education: Not on file   Highest education level: Not on file  Occupational History   Not on file  Tobacco Use   Smoking status: Every Day    Packs/day: 0.50    Years: 50.00    Pack  years: 25.00    Types: Cigarettes   Smokeless tobacco: Never  Vaping Use   Vaping Use: Never used  Substance and Sexual Activity   Alcohol use: Yes    Alcohol/week: 6.0 standard drinks    Types: 6 Standard drinks or equivalent per week    Comment: 11/07/20 - states he has not drank in 6 months   Drug use: No   Sexual activity: Yes    Partners: Female    Comment: monagamous stable relationship  Other Topics Concern   Not on file  Social History Narrative   Not on file   Social Determinants of Health   Financial Resource Strain: Not on file  Food Insecurity: Not on file  Transportation Needs: Not on file  Physical Activity: Not on file  Stress: Not on file  Social Connections: Not on file  Intimate Partner Violence: Not on file     REVIEW OF SYSTEMS:   _0  denotes positive finding, _1  denotes negative finding Cardiac  Comments:  Chest pain or chest pressure:    Shortness of breath upon exertion:    Short of breath when lying flat:    Irregular heart rhythm:        Vascular    Pain in calf, thigh, or hip brought on by ambulation:    Pain in feet at night that wakes you up from your sleep:     Blood clot in your veins:    Leg swelling:          Pulmonary    Oxygen at home:    Productive cough:     Wheezing:         Neurologic    Sudden weakness in arms or legs:     Sudden numbness in arms or legs:     Sudden onset of difficulty speaking or slurred speech:    Temporary loss of vision in one eye:     Problems with dizziness:         Gastrointestinal    Blood in stool:     Vomited blood:         Genitourinary    Burning when urinating:     Blood in urine:        Psychiatric    Major depression:         Hematologic    Bleeding problems:    Problems with blood clotting too easily:        Skin    Rashes or ulcers:        Constitutional    Fever or chills:      PHYSICAL EXAMINATION:  Today's Vitals   01/14/21 1504  BP: (!) 145/71  Pulse: 65  Resp: 20  Temp: 98.1 F (36.7 C)  TempSrc: Temporal  SpO2: 95%  Height: 6' (1.829 m)   Body mass index is 29.46 kg/m.   General:  WDWN in NAD; vital signs documented above Gait: Not observed HENT: WNL, normocephalic Pulmonary: normal non-labored breathing , without wheezing Cardiac: regular HR; with carotid bruits Skin: without rashes Vascular Exam/Pulses: Brisk biphasic doppler signals right DP/PT; left BKA Extremities: without ischemic changes, without Gangrene , without cellulitis; without open wounds   Musculoskeletal: no muscle wasting or atrophy  Neurologic: A&O X 3;  No focal weakness or paresthesias are detected Psychiatric:  The pt has Normal affect.   Non-Invasive Vascular Imaging:   ABI's/TBI's on 01/14/2021: Right:  1.20/amp - Great toe pressure: amp Left:  BKA  RLE Arterial duplex  on 01/14/2021: Right Graft #1: Femoral- BK popliteal  +------------------+--------+--------+--------+--------+                    PSV cm/sStenosisWaveformComments  +------------------+--------+--------+--------+--------+  Inflow            127             biphasic          +------------------+--------+--------+--------+--------+  Prox  Anastomosis  105             biphasic          +------------------+--------+--------+--------+--------+  Proximal Graft    98              biphasic          +------------------+--------+--------+--------+--------+  Mid Graft         52              biphasic          +------------------+--------+--------+--------+--------+  Distal Graft      85              biphasic          +------------------+--------+--------+--------+--------+  Distal Anastomosis107             biphasic          +------------------+--------+--------+--------+--------+  Outflow           115             biphasic          +------------------+--------+--------+--------+--------+    Summary:  Right: Patent right femoral- BK popliteal bypass graft with no evidence of  stenosis. In-graft stents appear patent.    ASSESSMENT/PLAN:: 70 y.o. male here for follow up for PAD and s/p Endovascular bypass with angioplasty and stent placement in the right SFA to below-knee popliteal bypass (5 mm x 250 mm Viabahn distally and 6 mm x 250 mm Viabahn proximally) on 11/14/2020  PAD -pt duplex today reveals patent bypass without evidence of stenosis and in graft stents appear patent.  -he will f/u in 3 months with aorto-iliac duplex, RLE duplex and ABI.  He will call sooner if he has any issues.    Carotid bruits bilaterally -pt had carotid duplex in April 2022 that revealed minimal disease.  He is asymptomatic  Cigarette smoking -current smoker.  Discussed importance of smoking cessation.  He has cut back and will continue to try to quit.     Leontine Locket, [facility] [phone]  Clinic MD:   [name]

## 2021-01-15 ENCOUNTER — Ambulatory Visit (INDEPENDENT_AMBULATORY_CARE_PROVIDER_SITE_OTHER): Payer: Medicare PPO | Admitting: *Deleted

## 2021-01-15 DIAGNOSIS — I480 Paroxysmal atrial fibrillation: Secondary | ICD-10-CM | POA: Diagnosis not present

## 2021-01-15 DIAGNOSIS — Z7901 Long term (current) use of anticoagulants: Secondary | ICD-10-CM | POA: Diagnosis not present

## 2021-01-15 LAB — POCT INR: INR: 3.9 — AB (ref 2.0–3.0)

## 2021-01-16 ENCOUNTER — Other Ambulatory Visit: Payer: Self-pay

## 2021-01-16 DIAGNOSIS — I739 Peripheral vascular disease, unspecified: Secondary | ICD-10-CM

## 2021-01-20 ENCOUNTER — Ambulatory Visit: Payer: Medicare PPO | Admitting: Student

## 2021-01-26 ENCOUNTER — Emergency Department (HOSPITAL_COMMUNITY): Payer: Medicare PPO

## 2021-01-26 ENCOUNTER — Emergency Department (HOSPITAL_COMMUNITY)
Admission: EM | Admit: 2021-01-26 | Discharge: 2021-01-26 | Disposition: A | Discharge status: Home / Self Care | Payer: Medicare PPO | Attending: Emergency Medicine | Admitting: Emergency Medicine

## 2021-01-26 ENCOUNTER — Other Ambulatory Visit: Payer: Self-pay

## 2021-01-26 ENCOUNTER — Encounter (HOSPITAL_COMMUNITY): Payer: Self-pay | Admitting: Emergency Medicine

## 2021-01-26 DIAGNOSIS — Z20822 Contact with and (suspected) exposure to covid-19: Secondary | ICD-10-CM | POA: Diagnosis not present

## 2021-01-26 DIAGNOSIS — I4891 Unspecified atrial fibrillation: Secondary | ICD-10-CM | POA: Insufficient documentation

## 2021-01-26 DIAGNOSIS — F17210 Nicotine dependence, cigarettes, uncomplicated: Secondary | ICD-10-CM | POA: Diagnosis not present

## 2021-01-26 DIAGNOSIS — I5020 Unspecified systolic (congestive) heart failure: Secondary | ICD-10-CM | POA: Diagnosis not present

## 2021-01-26 DIAGNOSIS — Z23 Encounter for immunization: Secondary | ICD-10-CM | POA: Diagnosis not present

## 2021-01-26 DIAGNOSIS — S91301A Unspecified open wound, right foot, initial encounter: Secondary | ICD-10-CM | POA: Insufficient documentation

## 2021-01-26 DIAGNOSIS — Z7902 Long term (current) use of antithrombotics/antiplatelets: Secondary | ICD-10-CM | POA: Diagnosis not present

## 2021-01-26 DIAGNOSIS — L03115 Cellulitis of right lower limb: Secondary | ICD-10-CM | POA: Diagnosis not present

## 2021-01-26 DIAGNOSIS — N179 Acute kidney failure, unspecified: Secondary | ICD-10-CM | POA: Insufficient documentation

## 2021-01-26 DIAGNOSIS — Z4781 Encounter for orthopedic aftercare following surgical amputation: Secondary | ICD-10-CM | POA: Diagnosis not present

## 2021-01-26 DIAGNOSIS — Z79899 Other long term (current) drug therapy: Secondary | ICD-10-CM | POA: Diagnosis not present

## 2021-01-26 DIAGNOSIS — D631 Anemia in chronic kidney disease: Secondary | ICD-10-CM | POA: Diagnosis not present

## 2021-01-26 DIAGNOSIS — X58XXXA Exposure to other specified factors, initial encounter: Secondary | ICD-10-CM | POA: Insufficient documentation

## 2021-01-26 DIAGNOSIS — E1122 Type 2 diabetes mellitus with diabetic chronic kidney disease: Secondary | ICD-10-CM | POA: Insufficient documentation

## 2021-01-26 DIAGNOSIS — S91104A Unspecified open wound of right lesser toe(s) without damage to nail, initial encounter: Secondary | ICD-10-CM | POA: Diagnosis not present

## 2021-01-26 DIAGNOSIS — Y900 Blood alcohol level of less than 20 mg/100 ml: Secondary | ICD-10-CM | POA: Insufficient documentation

## 2021-01-26 DIAGNOSIS — I130 Hypertensive heart and chronic kidney disease with heart failure and stage 1 through stage 4 chronic kidney disease, or unspecified chronic kidney disease: Secondary | ICD-10-CM | POA: Diagnosis not present

## 2021-01-26 DIAGNOSIS — Z7984 Long term (current) use of oral hypoglycemic drugs: Secondary | ICD-10-CM | POA: Diagnosis not present

## 2021-01-26 DIAGNOSIS — R58 Hemorrhage, not elsewhere classified: Secondary | ICD-10-CM

## 2021-01-26 DIAGNOSIS — Z7901 Long term (current) use of anticoagulants: Secondary | ICD-10-CM | POA: Diagnosis not present

## 2021-01-26 DIAGNOSIS — I2510 Atherosclerotic heart disease of native coronary artery without angina pectoris: Secondary | ICD-10-CM | POA: Diagnosis not present

## 2021-01-26 DIAGNOSIS — Z794 Long term (current) use of insulin: Secondary | ICD-10-CM | POA: Insufficient documentation

## 2021-01-26 DIAGNOSIS — I110 Hypertensive heart disease with heart failure: Secondary | ICD-10-CM | POA: Diagnosis not present

## 2021-01-26 DIAGNOSIS — I509 Heart failure, unspecified: Secondary | ICD-10-CM | POA: Diagnosis not present

## 2021-01-26 DIAGNOSIS — M7989 Other specified soft tissue disorders: Secondary | ICD-10-CM | POA: Diagnosis not present

## 2021-01-26 DIAGNOSIS — N183 Chronic kidney disease, stage 3 unspecified: Secondary | ICD-10-CM | POA: Diagnosis not present

## 2021-01-26 DIAGNOSIS — I480 Paroxysmal atrial fibrillation: Secondary | ICD-10-CM | POA: Diagnosis not present

## 2021-01-26 DIAGNOSIS — Z7951 Long term (current) use of inhaled steroids: Secondary | ICD-10-CM | POA: Diagnosis not present

## 2021-01-26 DIAGNOSIS — J449 Chronic obstructive pulmonary disease, unspecified: Secondary | ICD-10-CM | POA: Insufficient documentation

## 2021-01-26 DIAGNOSIS — S99921A Unspecified injury of right foot, initial encounter: Secondary | ICD-10-CM | POA: Diagnosis present

## 2021-01-26 DIAGNOSIS — I350 Nonrheumatic aortic (valve) stenosis: Secondary | ICD-10-CM | POA: Diagnosis not present

## 2021-01-26 LAB — COMPREHENSIVE METABOLIC PANEL
ALT: 10 U/L (ref 0–44)
AST: 12 U/L — ABNORMAL LOW (ref 15–41)
Albumin: 3.1 g/dL — ABNORMAL LOW (ref 3.5–5.0)
Alkaline Phosphatase: 59 U/L (ref 38–126)
Anion gap: 8 (ref 5–15)
BUN: 34 mg/dL — ABNORMAL HIGH (ref 8–23)
CO2: 27 mmol/L (ref 22–32)
Calcium: 8.9 mg/dL (ref 8.9–10.3)
Chloride: 108 mmol/L (ref 98–111)
Creatinine, Ser: 1.94 mg/dL — ABNORMAL HIGH (ref 0.61–1.24)
GFR, Estimated: 37 mL/min — ABNORMAL LOW (ref >60)
Glucose, Bld: 146 mg/dL — ABNORMAL HIGH (ref 70–99)
Potassium: 4.2 mmol/L (ref 3.5–5.1)
Sodium: 143 mmol/L (ref 135–145)
Total Bilirubin: 0.5 mg/dL (ref 0.3–1.2)
Total Protein: 6.8 g/dL (ref 6.5–8.1)

## 2021-01-26 LAB — CBC WITH DIFFERENTIAL/PLATELET
Abs Immature Granulocytes: 0.02 10*3/uL (ref 0.00–0.07)
Abs Immature Granulocytes: 0.04 10*3/uL (ref 0.00–0.07)
Basophils Absolute: 0.1 10*3/uL (ref 0.0–0.1)
Basophils Absolute: 0.1 10*3/uL (ref 0.0–0.1)
Basophils Relative: 1 %
Basophils Relative: 1 %
Eosinophils Absolute: 0.3 10*3/uL (ref 0.0–0.5)
Eosinophils Absolute: 0.3 10*3/uL (ref 0.0–0.5)
Eosinophils Relative: 4 %
Eosinophils Relative: 3 %
HCT: 34.6 % — ABNORMAL LOW (ref 39.0–52.0)
HCT: 29.1 % — ABNORMAL LOW (ref 39.0–52.0)
Hemoglobin: 10.7 g/dL — ABNORMAL LOW (ref 13.0–17.0)
Hemoglobin: 9.5 g/dL — ABNORMAL LOW (ref 13.0–17.0)
Immature Granulocytes: 0 %
Immature Granulocytes: 0 %
Lymphocytes Relative: 25 %
Lymphocytes Relative: 13 %
Lymphs Abs: 2 10*3/uL (ref 0.7–4.0)
Lymphs Abs: 1.4 10*3/uL (ref 0.7–4.0)
MCH: 28.9 pg (ref 26.0–34.0)
MCH: 30.6 pg (ref 26.0–34.0)
MCHC: 30.9 g/dL (ref 30.0–36.0)
MCHC: 32.6 g/dL (ref 30.0–36.0)
MCV: 93.5 fL (ref 80.0–100.0)
MCV: 93.9 fL (ref 80.0–100.0)
Monocytes Absolute: 0.7 10*3/uL (ref 0.1–1.0)
Monocytes Absolute: 0.7 10*3/uL (ref 0.1–1.0)
Monocytes Relative: 9 %
Monocytes Relative: 7 %
Neutro Abs: 4.7 10*3/uL (ref 1.7–7.7)
Neutro Abs: 7.8 10*3/uL — ABNORMAL HIGH (ref 1.7–7.7)
Neutrophils Relative %: 61 %
Neutrophils Relative %: 76 %
Platelets: 279 10*3/uL (ref 150–400)
Platelets: 229 10*3/uL (ref 150–400)
RBC: 3.7 MIL/uL — ABNORMAL LOW (ref 4.22–5.81)
RBC: 3.1 MIL/uL — ABNORMAL LOW (ref 4.22–5.81)
RDW: 13.9 % (ref 11.5–15.5)
RDW: 14 % (ref 11.5–15.5)
WBC: 7.8 10*3/uL (ref 4.0–10.5)
WBC: 10.2 10*3/uL (ref 4.0–10.5)
nRBC: 0 % (ref 0.0–0.2)
nRBC: 0 % (ref 0.0–0.2)

## 2021-01-26 LAB — TYPE AND SCREEN
ABO/RH(D): A POS
Antibody Screen: NEGATIVE

## 2021-01-26 LAB — PROTIME-INR
INR: 2.9 — ABNORMAL HIGH (ref 0.8–1.2)
Prothrombin Time: 30.2 seconds — ABNORMAL HIGH (ref 11.4–15.2)

## 2021-01-26 LAB — RAPID URINE DRUG SCREEN, HOSP PERFORMED
Amphetamines: NOT DETECTED
Barbiturates: NOT DETECTED
Benzodiazepines: NOT DETECTED
Cocaine: POSITIVE — AB
Opiates: NOT DETECTED
Tetrahydrocannabinol: POSITIVE — AB

## 2021-01-26 LAB — I-STAT CHEM 8, ED
BUN: 29 mg/dL — ABNORMAL HIGH (ref 8–23)
Calcium, Ion: 1.26 mmol/L (ref 1.15–1.40)
Chloride: 105 mmol/L (ref 98–111)
Creatinine, Ser: 1.9 mg/dL — ABNORMAL HIGH (ref 0.61–1.24)
Glucose, Bld: 142 mg/dL — ABNORMAL HIGH (ref 70–99)
HCT: 33 % — ABNORMAL LOW (ref 39.0–52.0)
Hemoglobin: 11.2 g/dL — ABNORMAL LOW (ref 13.0–17.0)
Potassium: 4.1 mmol/L (ref 3.5–5.1)
Sodium: 139 mmol/L (ref 135–145)
TCO2: 26 mmol/L (ref 22–32)

## 2021-01-26 LAB — RESP PANEL BY RT-PCR (FLU A&B, COVID) ARPGX2
Influenza A by PCR: NEGATIVE
Influenza B by PCR: NEGATIVE
SARS Coronavirus 2 by RT PCR: NEGATIVE

## 2021-01-26 LAB — ETHANOL: Alcohol, Ethyl (B): <10 mg/dL (ref <10)

## 2021-01-26 IMAGING — DX DG FOOT COMPLETE 3+V*R*
3 series · 3 of 3 positions shown · non-contrast
Comparison: [DATE]

CLINICAL DATA: Right lower extremity bleeding. History of great toe
amputation [DATE]

EXAM:
RIGHT FOOT COMPLETE - 3 VIEW

[foot ap]
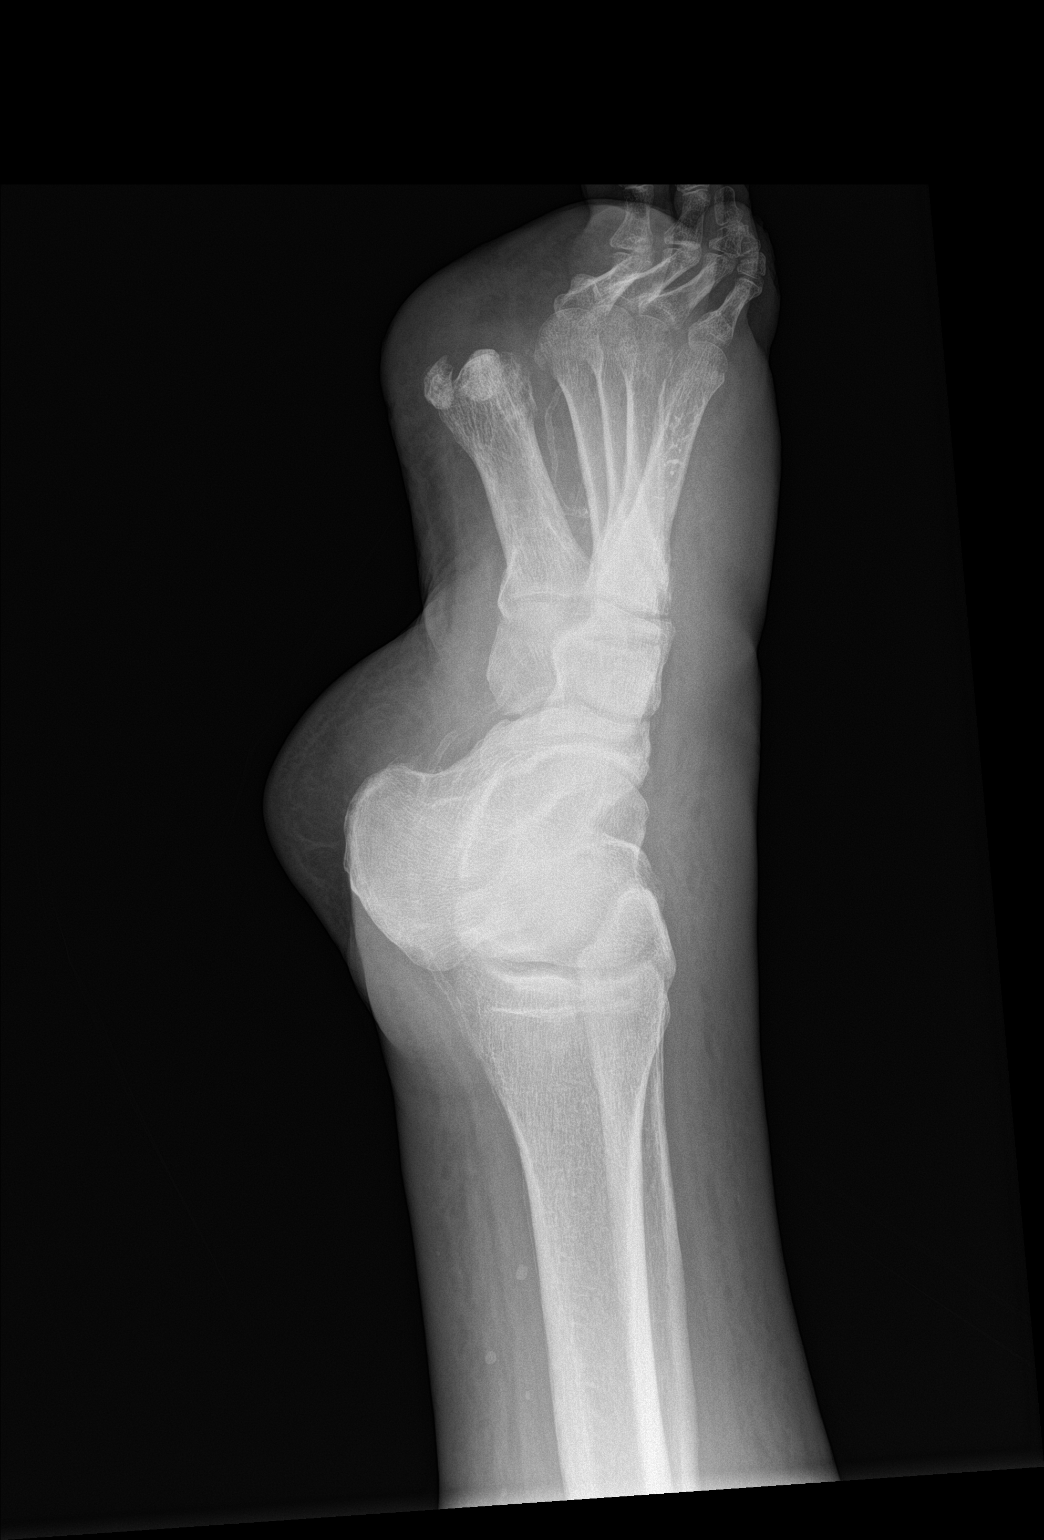

[foot obl]
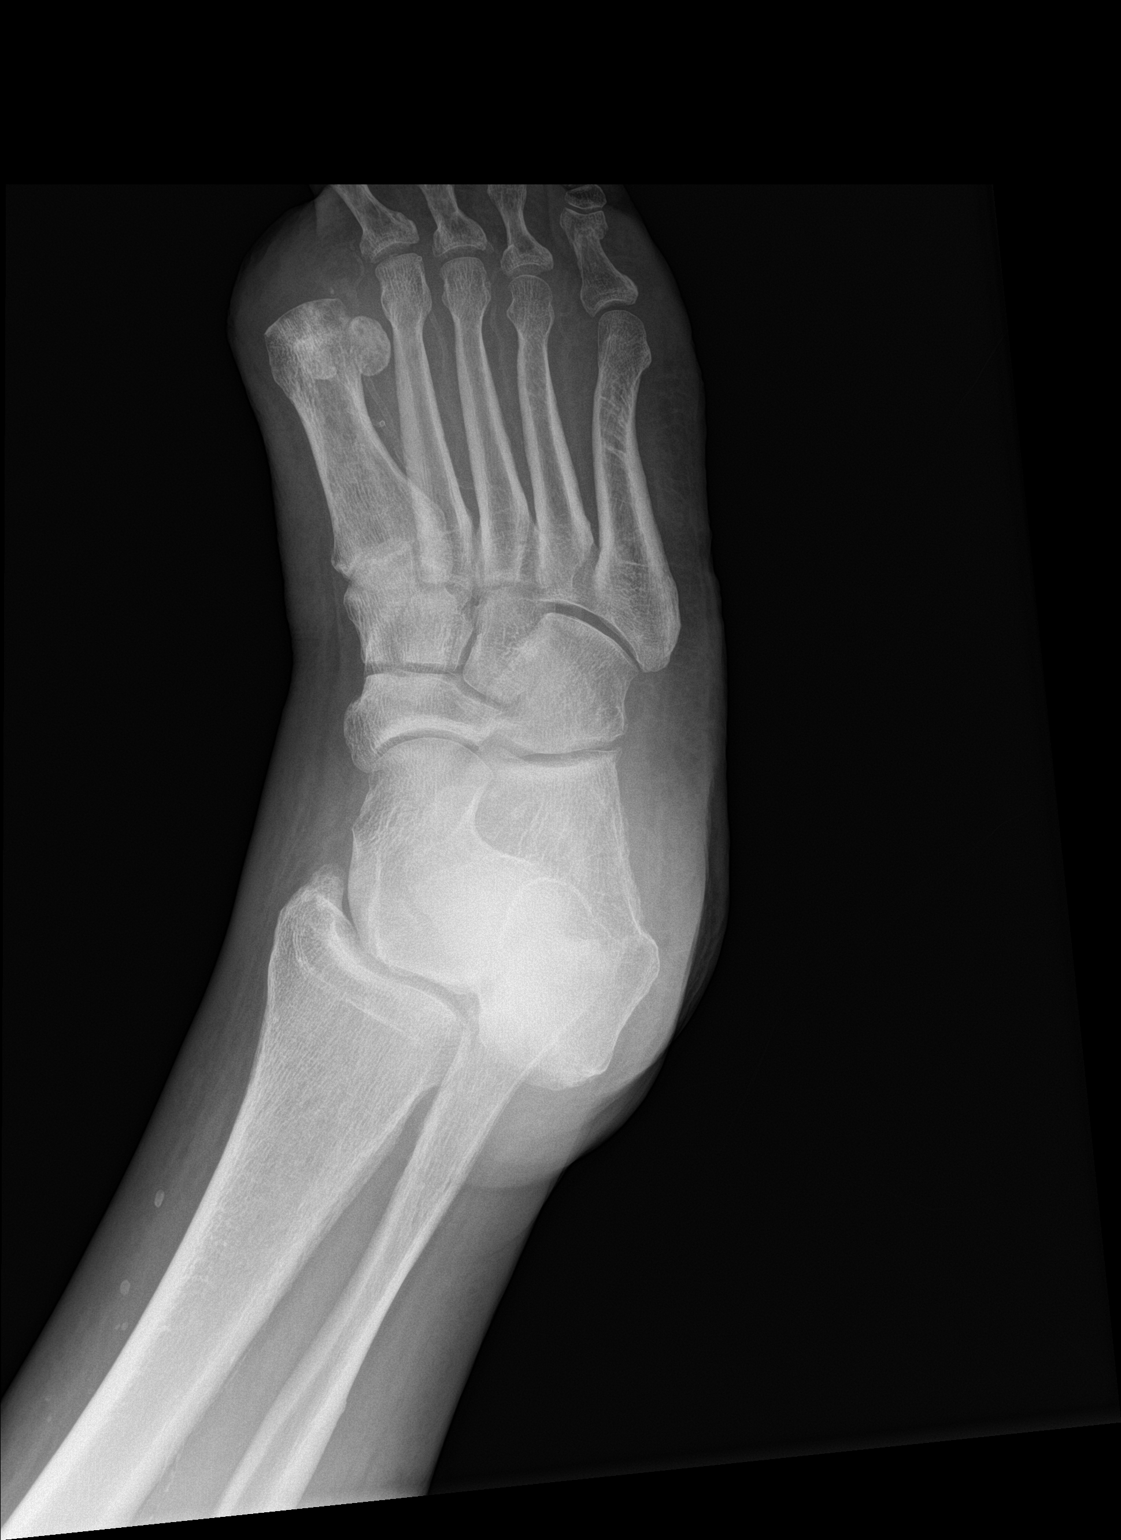

[foot lat]
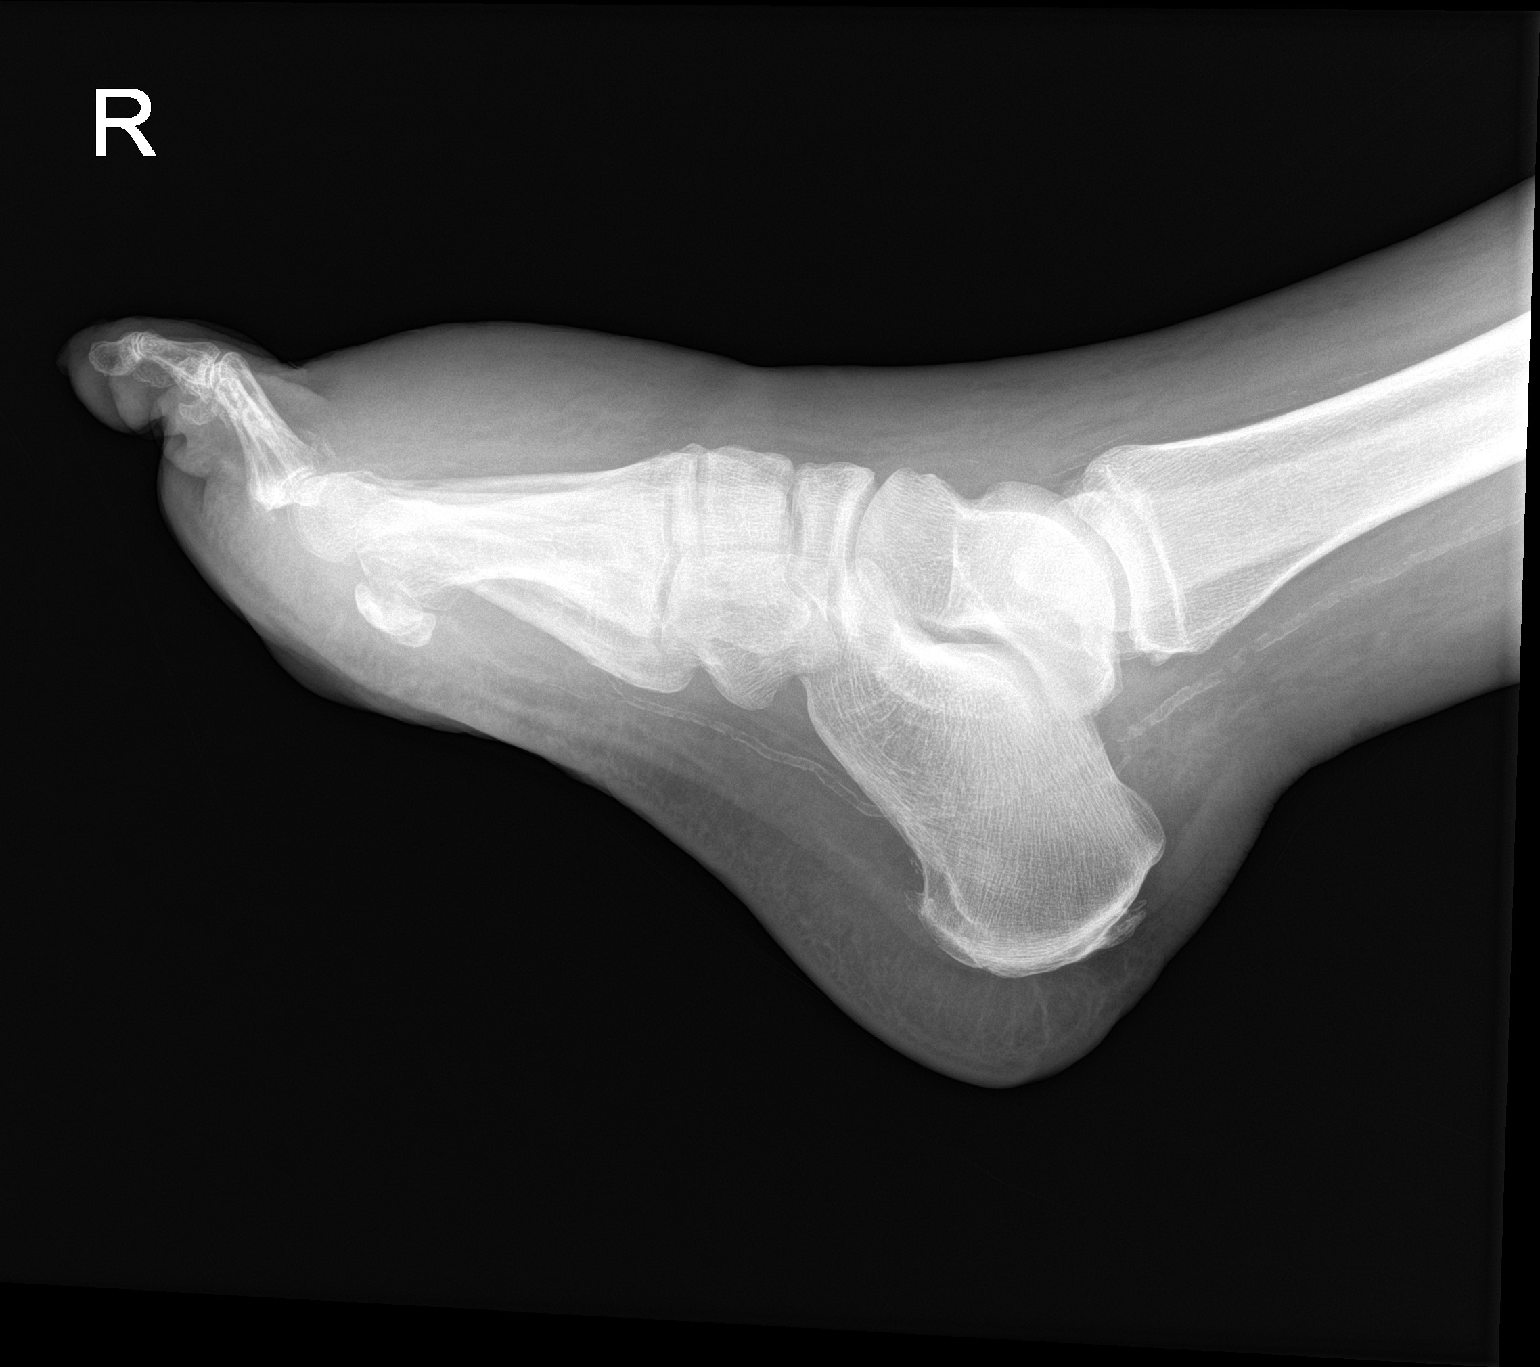

[3 of 3 positions shown; findings below may reference images not displayed]

FINDINGS: Soft tissue swelling which is nonspecific. Great toe amputation. No
erosion, acute fracture, or subluxation. Extensive arterial
calcification.
IMPRESSION: Soft tissue swelling without acute osseous finding.

## 2021-01-26 IMAGING — DX DG CHEST 1V PORT
2 series · 2 of 2 positions shown · non-contrast
Comparison: [DATE]

CLINICAL DATA: Hemorrhage.

EXAM:
PORTABLE CHEST 1 VIEW

[chest ap (1 of 2)]
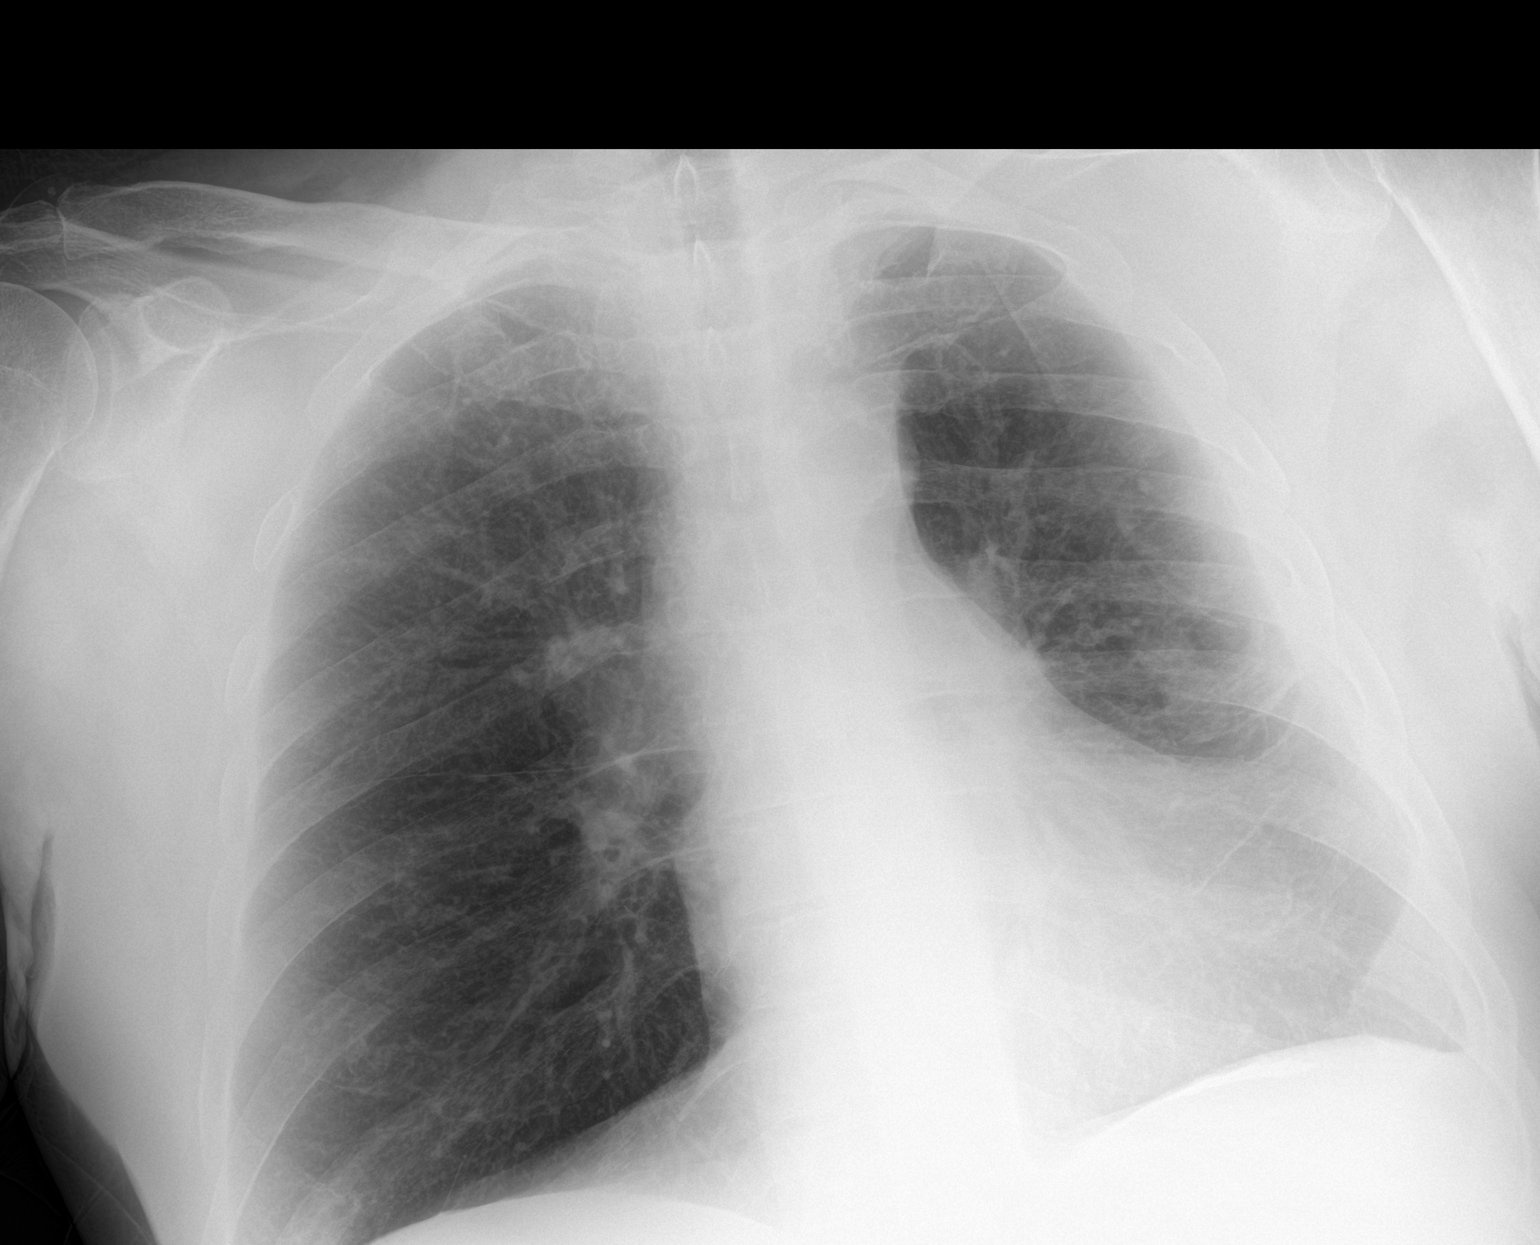

[chest ap (2 of 2)]
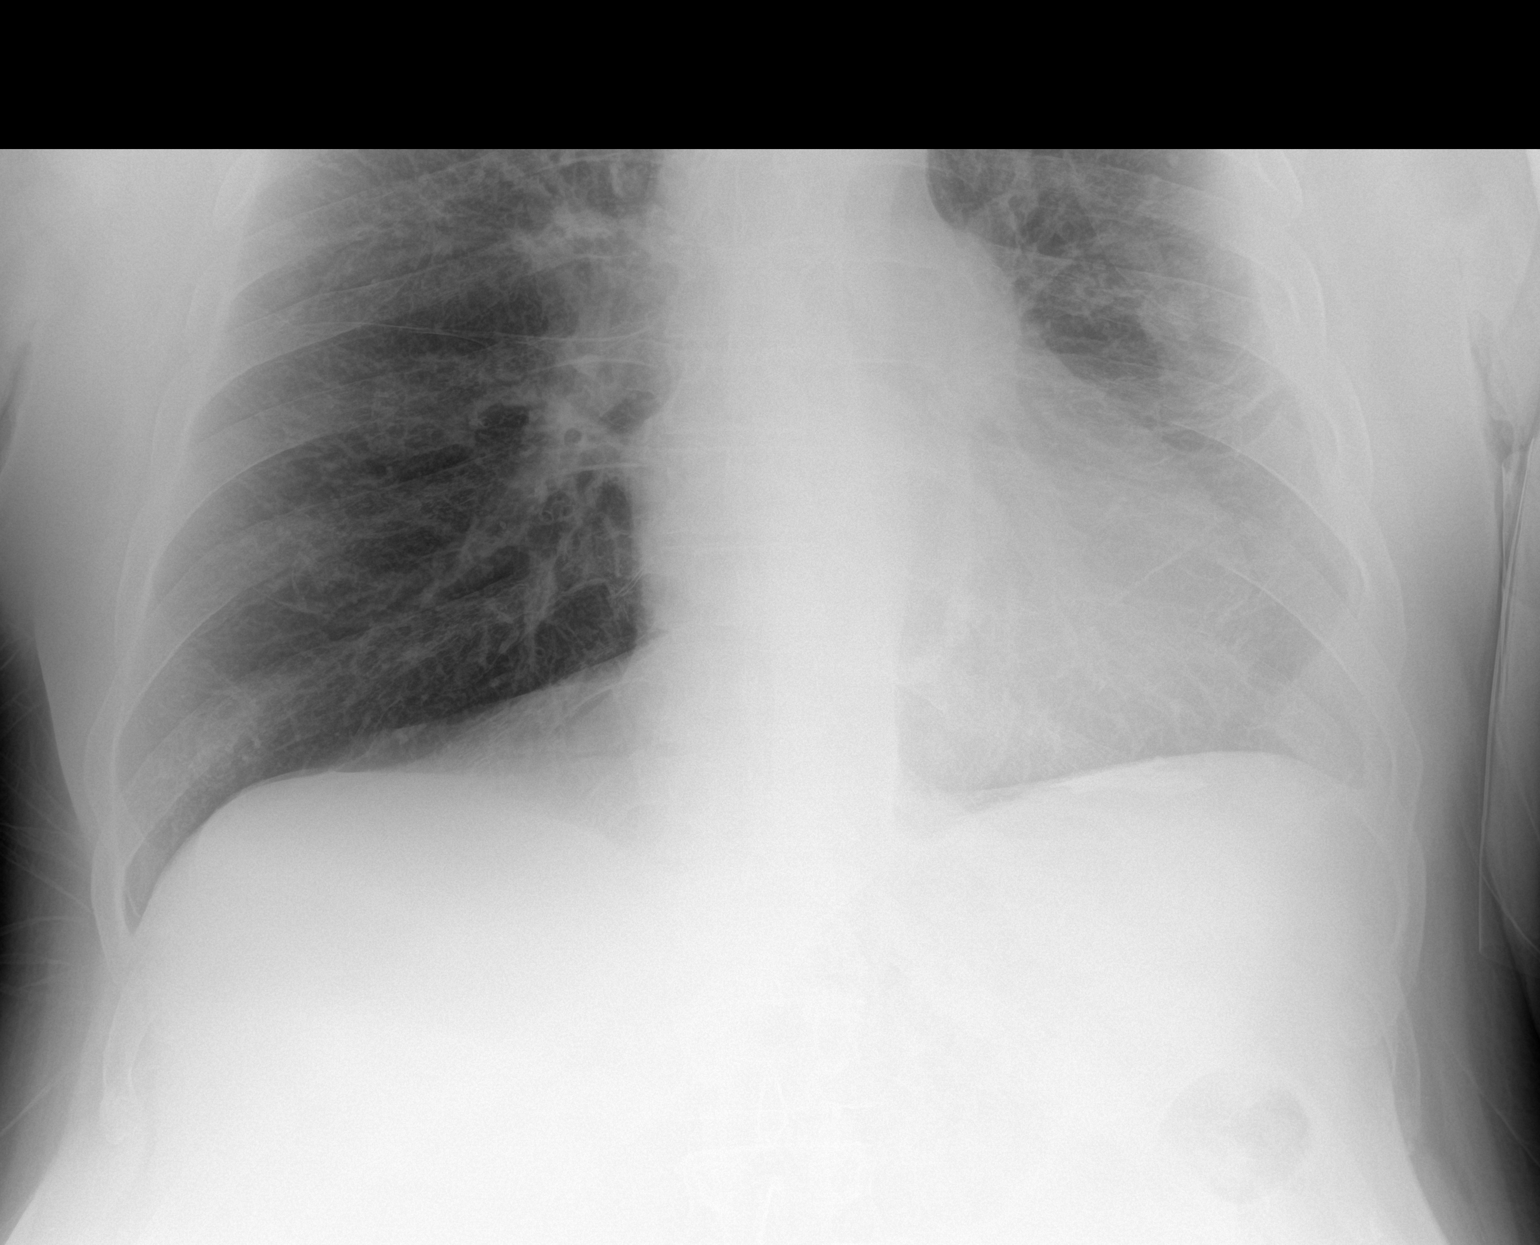

[2 of 2 positions shown; findings below may reference images not displayed]

FINDINGS: No pneumothorax. The right lung is clear. Chronic changes in the
left base. Probable small left effusion. No other acute
abnormalities.
IMPRESSION: 1. Small left effusion suspected. Chronic changes in the left base.
No acute abnormalities.

## 2021-01-26 MED ORDER — SODIUM CHLORIDE 0.9 % IV BOLUS
500.0000 mL | Freq: Once | INTRAVENOUS | Status: AC
  Administered 2021-01-26: 500 mL via INTRAVENOUS

## 2021-01-26 MED ORDER — TRANEXAMIC ACID FOR EPISTAXIS
500.0000 mg | Freq: Once | TOPICAL | Status: AC
  Administered 2021-01-26: 500 mg via TOPICAL
  Filled 2021-01-26: qty 10

## 2021-01-26 MED ORDER — CLINDAMYCIN HCL 150 MG PO CAPS: 300.0000 mg | ORAL_CAPSULE | Freq: Three times a day (TID) | ORAL | 0 refills | Status: AC

## 2021-01-26 MED ORDER — CEFAZOLIN SODIUM-DEXTROSE 2-4 GM/100ML-% IV SOLN
2.0000 g | Freq: Once | INTRAVENOUS | Status: AC
  Administered 2021-01-26: 2 g via INTRAVENOUS
  Filled 2021-01-26: qty 100

## 2021-01-26 MED ORDER — TETANUS-DIPHTH-ACELL PERTUSSIS 5-2.5-18.5 LF-MCG/0.5 IM SUSY
0.5000 mL | PREFILLED_SYRINGE | Freq: Once | INTRAMUSCULAR | Status: AC
  Administered 2021-01-26: 0.5 mL via INTRAMUSCULAR
  Filled 2021-01-26: qty 0.5

## 2021-01-26 MED ORDER — BACITRACIN ZINC 500 UNIT/GM EX OINT
TOPICAL_OINTMENT | CUTANEOUS | Status: AC
  Filled 2021-01-26: qty 1.8

## 2021-01-26 NOTE — ED Notes (Signed)
Pt educated on follow up care.  Pt verbalized understanding of care and follow up with medications.  Pt dc via wheelchair to front lobby.

## 2021-01-26 NOTE — ED Triage Notes (Signed)
Patient states he woke up and there was bleeding to his RLE. Patient had his great toe on the right amputated 7/22.

## 2021-01-26 NOTE — ED Provider Notes (Signed)
  Physical Exam  BP 135/63   Pulse 76   Temp 98.5 F (36.9 C)   Resp 18 Comment: Simultaneous filing. User may not have seen previous data.  Ht 6' (1.829 m)   Wt 99.8 kg   SpO2 95%   BMI 29.84 kg/m   Physical Exam  ED Course/Procedures     Procedures  MDM  Received care of pt from Dr. Randal Buba. Presents with concern for bleeding from toe. Unclear when wound happened by history, has neuropathy, may have occurred earlier but bleeding started this AM. Significant bleeding by history.  Repeat hgb 9.5, decreased but similar to prior baselines after receiving IV hydration.  No continued bleeding. Given duration of time wound may have been open, do not feel it is amenable to repair.  Cleaned with saline. Has area of erythema around wound on lower leg, and given this as well as high risk toe wound, will place on antibiotics and have him follow up with Dr. Sharol Given.         Gareth Morgan, MD 01/26/21 2358

## 2021-01-26 NOTE — ED Provider Notes (Addendum)
[facility] DEPT Provider Note   CSN: [id number] Arrival date & time: 01/26/21  7262     History Chief Complaint  Patient presents with   Bleeding/Bruising    Rodney [name] is a 70 y.o. male.  The history is provided by the patient.  Illness Location:  Right foot Quality:  Bleeding Severity:  Severe Onset quality:  Gradual Timing:  Constant Progression:  Unchanged Chronicity:  New Context:  On coumadin Relieved by:  Nothing Worsened by:  Nothing Ineffective treatments:  None tried Associated symptoms: no abdominal pain, no cough, no diarrhea, no fever, no headaches, no myalgias, no shortness of breath and no vomiting   Patient with PAD on coumadin presents with bleeding from the R foot.  Unknown injury (wife stated he may have injured it when he came outside on Friday. Patient stated he awoke with bleeding.  Has a shopping bag full of blood.      Past Medical History:  Diagnosis Date   Aortic stenosis    moderate in 2022   Atrial fibrillation (HCC)    CHF (congestive heart failure) ([place])    Coronary artery disease    Diabetes mellitus without complication ([place])    HLD (hyperlipidemia)    Hypertension    Peripheral arterial disease ([place])     Patient Active Problem List   Diagnosis Date Noted   Hyperkalemia 12/10/2020   COPD (chronic obstructive pulmonary disease) ([place]) 12/09/2020   Peripheral neuropathy 11/11/2020   Open wound of right great toe    Supratherapeutic INR 11/10/2020   History of DVT (deep vein thrombosis) 11/10/2020   Acute deep vein thrombosis (DVT) of iliac vein of right lower extremity (HCC)    Acute on chronic anemia    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   CAD (coronary artery disease) 02/15/2020   CKD stage 3 due to type 2 diabetes mellitus ([place]) 02/15/2020   Long term (current) use of anticoagulants 12/29/2019   S/P BKA (below knee amputation) unilateral, left ([place])  11/14/2019   High risk social situation 11/14/2019   AF (paroxysmal atrial fibrillation) ([place]) 10/03/2019   HFrEF (heart failure with reduced ejection fraction) ([place]) 09/28/2019   Moderate aortic stenosis 09/28/2019   Anemia in chronic illness 09/27/2019   Protein calorie malnutrition (HCC)    PVD (peripheral vascular disease) ([place])    Umbilical hernia 03/55/9741   Obesity 10/09/2015   Tobacco abuse 09/07/2013   DM (diabetes mellitus), type 2 with neurological complications ([place]) 63/84/5364   Hypercholesteremia 10/28/2010   ERECTILE DYSFUNCTION 05/22/2009   Essential hypertension 01/23/2009    Past Surgical History:  Procedure Laterality Date   ABDOMINAL AORTOGRAM W/LOWER EXTREMITY N/A 08/05/2020   Procedure: ABDOMINAL AORTOGRAM W/LOWER EXTREMITY;  Surgeon: Marty Heck, MD;  Location: [facility];  Service: Cardiovascular;  Laterality: N/A;   ABDOMINAL AORTOGRAM W/LOWER EXTREMITY N/A 11/13/2020   Procedure: ABDOMINAL AORTOGRAM W/LOWER EXTREMITY;  Surgeon: Cherre Robins, MD;  Location: [facility];  Service: Cardiovascular;  Laterality: N/A;   AMPUTATION Left 09/28/2019   Procedure: AMPUTATION BELOW KNEE;  Surgeon: Newt Minion, MD;  Location: [facility];  Service: Orthopedics;  Laterality: Left;   AMPUTATION Right 11/15/2020   Procedure: RIGHT GREAT TOE AMPUTATION;  Surgeon: Newt Minion, MD;  Location: [facility];  Service: Orthopedics;  Laterality: Right;   CARDIAC CATHETERIZATION     CARDIOVERSION N/A 10/05/2019   Procedure: CARDIOVERSION;  Surgeon: Sanda Klein, MD;  Location:  [facility];  Service: Cardiovascular;  Laterality: N/A;   FEMORAL-POPLITEAL BYPASS GRAFT Right 08/07/2020   Procedure: RIGHT FEMORAL TO BELOW KNEE POPLITEAL ARTERY BYPASS;  Surgeon: Waynetta Sandy, MD;  Location: [facility];  Service: Vascular;  Laterality: Right;   LEFT HEART CATH AND CORONARY ANGIOGRAPHY N/A 10/03/2019   Procedure: LEFT HEART CATH AND CORONARY ANGIOGRAPHY;  Surgeon:  Lorretta Harp, MD;  Location: [facility];  Service: Cardiovascular;  Laterality: N/A;   PERIPHERAL VASCULAR INTERVENTION Right 08/05/2020   Procedure: PERIPHERAL VASCULAR INTERVENTION;  Surgeon: Marty Heck, MD;  Location: [facility];  Service: Cardiovascular;  Laterality: Right;  common Iliac   PERIPHERAL VASCULAR INTERVENTION Left 11/13/2020   Procedure: PERIPHERAL VASCULAR INTERVENTION;  Surgeon: Cherre Robins, MD;  Location: [facility];  Service: Cardiovascular;  Laterality: Left;   PERIPHERAL VASCULAR INTERVENTION Right 11/14/2020   Procedure: PERIPHERAL VASCULAR INTERVENTION;  Surgeon: Marty Heck, MD;  Location: [facility];  Service: Cardiovascular;  Laterality: Right;  POP/SFA STENT   TEE WITHOUT CARDIOVERSION N/A 10/05/2019   Procedure: TRANSESOPHAGEAL ECHOCARDIOGRAM (TEE);  Surgeon: Sanda Klein, MD;  Location[facility];  Service: Cardiovascular;  Laterality: N/A;       Family History  Problem Relation Age of Onset   Alcoholism Mother    Alcoholism Father     Social History   Tobacco Use   Smoking status: Every Day    Packs/day: 0.50    Years: 50.00    Pack years: 25.00    Types: Cigarettes   Smokeless tobacco: Never  Vaping Use   Vaping Use: Never used  Substance Use Topics   Alcohol use: Yes    Alcohol/week: 6.0 standard drinks    Types: 6 Standard drinks or equivalent per week    Comment: 11/07/20 - states he has not drank in 6 months   Drug use: No    Home Medications Prior to Admission medications   Medication Sig Start Date End Date Taking? Authorizing Provider  acetaminophen (TYLENOL) 325 MG tablet Take 1-2 tablets (325-650 mg total) by mouth every 6 (six) hours as needed for mild pain (pain score 1-3 or temp > 100.5). 11/17/20   Carollee Leitz, MD  albuterol (VENTOLIN HFA) 108 (90 Base) MCG/ACT inhaler Inhale 2 puffs into the lungs every 6 (six) hours as needed for wheezing or shortness of breath.  12/09/20   [name], MD  amiodarone (PACERONE) 200 MG tablet Take 1 tablet (200 mg total) by mouth daily. 10/14/20   [name], MD  atorvastatin (LIPITOR) 80 MG tablet TAKE 1 TABLET EVERY DAY 03/25/20   O'Neal, Cassie Freer, MD  Blood Glucose Monitoring Suppl (TRUE METRIX METER) DEVI Use to test blood sugar three times daily. 11/20/19   [name], MD  Blood Glucose Monitoring Suppl (TRUE METRIX METER) w/Device KIT USE AS DIRECTED 03/25/20   Hensel, Jamal Collin, MD  carvedilol (COREG) 3.125 MG tablet Take 1 tablet (3.125 mg total) by mouth 2 (two) times daily with a meal. Patient taking differently: Take 1.5625 mg by mouth 2 (two) times daily with a meal. 11/21/20   Ezequiel Essex, MD  clopidogrel (PLAVIX) 75 MG tablet Take 1 tablet (75 mg total) by mouth daily with breakfast. 11/18/20   Carollee Leitz, MD  furosemide (LASIX) 40 MG tablet Take 1 tablet (40 mg total) by mouth daily as needed for fluid (swelling). 11/21/20   Ezequiel Essex, MD  gabapentin (NEURONTIN) 100 MG capsule Take  2 capsules (200 mg total) by mouth 3 (three) times daily. 11/17/20   Carollee Leitz, MD  glucose blood (RELION TRUE METRIX TEST STRIPS) test strip Use to test blood sugar three times per day. 11/20/19   [name], MD  insulin NPH-regular Human (NOVOLIN 70/30 RELION) (70-30) 100 UNIT/ML injection Inject 15-30 Units into the skin See admin instructions. Inject 30 units into the skin with breakfast and 15 units with supper    [provider]  metFORMIN (GLUCOPHAGE) 1000 MG tablet Take 1 tablet (1,000 mg total) by mouth daily. 09/18/20   [name], MD  Multiple Vitamin (MULTIVITAMIN WITH MINERALS) TABS tablet Take 1 tablet by mouth daily. 11/18/20   Carollee Leitz, MD  nicotine (NICODERM CQ - DOSED IN MG/24 HOURS) 14 mg/24hr patch Place 1 patch (14 mg total) onto the skin daily. 12/09/20   [name], MD  Nutritional Supplements (FEEDING SUPPLEMENT, NEPRO CARB STEADY,) LIQD Take 237  mLs by mouth 2 (two) times daily between meals. 11/22/20   Ezequiel Essex, MD  TRUEplus Lancets 33G MISC Use to test blood sugar three times per day. 11/20/19   [name], MD  umeclidinium bromide (INCRUSE ELLIPTA) 62.5 MCG/INH AEPB Inhale 1 puff into the lungs daily. 12/09/20   [name], MD  warfarin (COUMADIN) 5 MG tablet Take 7.41m (one and a half tablets) on Mondays, Wednesdays and Fridays; Take 518m(one tablet) on Tuesdays, Thursdays, Saturdays and Sundays. OR as directed by Warfarin Clinic 12/11/20   HeZenia ResidesMD    Allergies    Patient has no known allergies.  Review of Systems   Review of Systems  Constitutional:  Negative for fever.  HENT:  Negative for facial swelling.   Eyes:  Negative for redness.  Respiratory:  Negative for cough and shortness of breath.   Gastrointestinal:  Negative for abdominal pain, diarrhea and vomiting.  Genitourinary:  Negative for difficulty urinating.  Musculoskeletal:  Negative for myalgias.  Skin:  Positive for wound.  Neurological:  Negative for headaches.  Psychiatric/Behavioral:  Negative for agitation.   All other systems reviewed and are negative.  Physical Exam Updated Vital Signs BP 107/61   Pulse 66   Temp 98.5 F (36.9 C)   Resp 15   Ht 6' (1.829 m)   Wt 99.8 kg   SpO2 94%   BMI 29.84 kg/m   Physical Exam Vitals and nursing note reviewed. Exam conducted with a chaperone present.  Constitutional:      Appearance: Normal appearance.  HENT:     Head: Normocephalic and atraumatic.     Nose: Nose normal.  Eyes:     Conjunctiva/sclera: Conjunctivae normal.     Pupils: Pupils are equal, round, and reactive to light.  Cardiovascular:     Rate and Rhythm: Normal rate and regular rhythm.     Pulses: Normal pulses.     Heart sounds: Normal heart sounds.  Pulmonary:     Breath sounds: Decreased air movement present. No rales.  Abdominal:     General: Abdomen is flat. Bowel sounds are normal.      Palpations: Abdomen is soft.     Tenderness: There is no abdominal tenderness. There is no guarding.  Musculoskeletal:     Cervical back: Normal range of motion and neck supple.       Legs:  Skin:    General: Skin is warm.     Capillary Refill: Capillary refill takes less than 2 seconds.  Neurological:  General: No focal deficit present.     Mental Status: He is alert and oriented to person, place, and time.  Psychiatric:        Mood and Affect: Mood normal.        Behavior: Behavior normal.          ED Results / Procedures / Treatments   Labs (all labs ordered are listed, but only abnormal results are displayed)  EKG None  Radiology DG Chest Portable 1 View  Result Date: 01/26/2021 CLINICAL DATA:  Hemorrhage. EXAM: PORTABLE CHEST 1 VIEW COMPARISON:  November 18, 2020 FINDINGS: No pneumothorax. The right lung is clear. Chronic changes in the left base. Probable small left effusion. No other acute abnormalities. IMPRESSION: 1. Small left effusion suspected. Chronic changes in the left base. No acute abnormalities. Electronically Signed   By: Dorise Bullion III M.D.   On: 01/26/2021 06:40   DG Foot Complete Right  Result Date: 01/26/2021 CLINICAL DATA:  Right lower extremity bleeding. History of great toe amputation 7/22 EXAM: RIGHT FOOT COMPLETE - 3 VIEW COMPARISON:  11/18/2020 FINDINGS: Soft tissue swelling which is nonspecific. Great toe amputation. No erosion, acute fracture, or subluxation. Extensive arterial calcification. IMPRESSION: Soft tissue swelling without acute osseous finding. Electronically Signed   By: Jorje Guild M.D.   On: 01/26/2021 06:39    Procedures Procedures   Medications Ordered in ED Medications  tranexamic acid (CYKLOKAPRON) 1000 MG/10ML topical solution 500 mg (500 mg Topical Given 01/26/21 0620)  Tdap (BOOSTRIX) injection 0.5 mL (0.5 mLs Intramuscular Given 01/26/21 3419)    ED Course  I have reviewed the triage vital signs and the  nursing notes.  Pertinent labs & imaging results that were available during my care of the patient were reviewed by me and considered in my medical decision making (see chart for details).   Wound appears older than 8 hours. Tissue is devitalized.  I do not believe it should be sutured at this time unless there is active bleeding.  But there is no bleeder at this time.  Signed out to AM attending pending repeat CBC and evaluation for repeat bleeding.   Rodney [name] was evaluated in Emergency Department on 01/26/2021 for the symptoms described in the history of present illness. He was evaluated in the context of the global COVID-19 pandemic, which necessitated consideration that the patient might be at risk for infection with the SARS-CoV-2 virus that causes COVID-19. Institutional protocols and algorithms that pertain to the evaluation of patients at risk for COVID-19 are in a state of rapid change based on information released by regulatory bodies including the CDC and federal and state organizations. These policies and algorithms were followed during the patient's care in the ED.   Final Clinical Impression(s) / ED Diagnoses Final diagnoses:  Bleeding  AKI (acute kidney injury) [facility])   Signed out to Dr. Billy Fischer 7 am  Rx / DC Orders ED Discharge Orders     None        Admire Bunnell, MD 01/26/21 3790    Randal Buba, Lilas Diefendorf, MD 01/26/21 2409

## 2021-01-27 ENCOUNTER — Ambulatory Visit: Payer: Medicare PPO | Admitting: Family Medicine

## 2021-01-28 DIAGNOSIS — J449 Chronic obstructive pulmonary disease, unspecified: Secondary | ICD-10-CM | POA: Diagnosis not present

## 2021-01-28 DIAGNOSIS — E1122 Type 2 diabetes mellitus with diabetic chronic kidney disease: Secondary | ICD-10-CM | POA: Diagnosis not present

## 2021-01-28 DIAGNOSIS — I5020 Unspecified systolic (congestive) heart failure: Secondary | ICD-10-CM | POA: Diagnosis not present

## 2021-01-28 DIAGNOSIS — I110 Hypertensive heart disease with heart failure: Secondary | ICD-10-CM | POA: Diagnosis not present

## 2021-01-28 DIAGNOSIS — I350 Nonrheumatic aortic (valve) stenosis: Secondary | ICD-10-CM | POA: Diagnosis not present

## 2021-01-28 DIAGNOSIS — Z4781 Encounter for orthopedic aftercare following surgical amputation: Secondary | ICD-10-CM | POA: Diagnosis not present

## 2021-01-28 DIAGNOSIS — N183 Chronic kidney disease, stage 3 unspecified: Secondary | ICD-10-CM | POA: Diagnosis not present

## 2021-01-28 DIAGNOSIS — D631 Anemia in chronic kidney disease: Secondary | ICD-10-CM | POA: Diagnosis not present

## 2021-01-28 DIAGNOSIS — I480 Paroxysmal atrial fibrillation: Secondary | ICD-10-CM | POA: Diagnosis not present

## 2021-02-03 ENCOUNTER — Ambulatory Visit: Payer: Medicare PPO | Admitting: Family Medicine

## 2021-02-07 DIAGNOSIS — I350 Nonrheumatic aortic (valve) stenosis: Secondary | ICD-10-CM | POA: Diagnosis not present

## 2021-02-07 DIAGNOSIS — I480 Paroxysmal atrial fibrillation: Secondary | ICD-10-CM | POA: Diagnosis not present

## 2021-02-07 DIAGNOSIS — I5020 Unspecified systolic (congestive) heart failure: Secondary | ICD-10-CM | POA: Diagnosis not present

## 2021-02-07 DIAGNOSIS — J449 Chronic obstructive pulmonary disease, unspecified: Secondary | ICD-10-CM | POA: Diagnosis not present

## 2021-02-07 DIAGNOSIS — I110 Hypertensive heart disease with heart failure: Secondary | ICD-10-CM | POA: Diagnosis not present

## 2021-02-07 DIAGNOSIS — E1122 Type 2 diabetes mellitus with diabetic chronic kidney disease: Secondary | ICD-10-CM | POA: Diagnosis not present

## 2021-02-07 DIAGNOSIS — Z4781 Encounter for orthopedic aftercare following surgical amputation: Secondary | ICD-10-CM | POA: Diagnosis not present

## 2021-02-07 DIAGNOSIS — N183 Chronic kidney disease, stage 3 unspecified: Secondary | ICD-10-CM | POA: Diagnosis not present

## 2021-02-07 DIAGNOSIS — D631 Anemia in chronic kidney disease: Secondary | ICD-10-CM | POA: Diagnosis not present

## 2021-02-08 DIAGNOSIS — Z4781 Encounter for orthopedic aftercare following surgical amputation: Secondary | ICD-10-CM | POA: Diagnosis not present

## 2021-02-08 DIAGNOSIS — I350 Nonrheumatic aortic (valve) stenosis: Secondary | ICD-10-CM | POA: Diagnosis not present

## 2021-02-08 DIAGNOSIS — D631 Anemia in chronic kidney disease: Secondary | ICD-10-CM | POA: Diagnosis not present

## 2021-02-08 DIAGNOSIS — I110 Hypertensive heart disease with heart failure: Secondary | ICD-10-CM | POA: Diagnosis not present

## 2021-02-08 DIAGNOSIS — I5020 Unspecified systolic (congestive) heart failure: Secondary | ICD-10-CM | POA: Diagnosis not present

## 2021-02-08 DIAGNOSIS — I480 Paroxysmal atrial fibrillation: Secondary | ICD-10-CM | POA: Diagnosis not present

## 2021-02-08 DIAGNOSIS — E1122 Type 2 diabetes mellitus with diabetic chronic kidney disease: Secondary | ICD-10-CM | POA: Diagnosis not present

## 2021-02-08 DIAGNOSIS — N183 Chronic kidney disease, stage 3 unspecified: Secondary | ICD-10-CM | POA: Diagnosis not present

## 2021-02-08 DIAGNOSIS — J449 Chronic obstructive pulmonary disease, unspecified: Secondary | ICD-10-CM | POA: Diagnosis not present

## 2021-02-12 ENCOUNTER — Telehealth: Payer: Self-pay

## 2021-02-12 DIAGNOSIS — E1122 Type 2 diabetes mellitus with diabetic chronic kidney disease: Secondary | ICD-10-CM | POA: Diagnosis not present

## 2021-02-12 DIAGNOSIS — N183 Chronic kidney disease, stage 3 unspecified: Secondary | ICD-10-CM | POA: Diagnosis not present

## 2021-02-12 DIAGNOSIS — I350 Nonrheumatic aortic (valve) stenosis: Secondary | ICD-10-CM | POA: Diagnosis not present

## 2021-02-12 DIAGNOSIS — J449 Chronic obstructive pulmonary disease, unspecified: Secondary | ICD-10-CM | POA: Diagnosis not present

## 2021-02-12 DIAGNOSIS — I5020 Unspecified systolic (congestive) heart failure: Secondary | ICD-10-CM | POA: Diagnosis not present

## 2021-02-12 DIAGNOSIS — I110 Hypertensive heart disease with heart failure: Secondary | ICD-10-CM | POA: Diagnosis not present

## 2021-02-12 DIAGNOSIS — I480 Paroxysmal atrial fibrillation: Secondary | ICD-10-CM | POA: Diagnosis not present

## 2021-02-12 DIAGNOSIS — D631 Anemia in chronic kidney disease: Secondary | ICD-10-CM | POA: Diagnosis not present

## 2021-02-12 DIAGNOSIS — Z4781 Encounter for orthopedic aftercare following surgical amputation: Secondary | ICD-10-CM | POA: Diagnosis not present

## 2021-02-12 NOTE — Telephone Encounter (Signed)
Patient's significant other, [name], calls nurse line regarding concerns with patient having blister on top of right foot. Reports that foot is more swollen than normal, however, denies redness, severe pain or fever.   Home health nurse also calls nurse line to report pitting edema +2 and is retaining fluid.   Advised of ED precautions and scheduled patient for next available appointment for tomorrow morning.    Talbot Grumbling, RN

## 2021-02-12 NOTE — Telephone Encounter (Signed)
Called.  No fever or redness.  "Just a water blister."  Told to take his lasix twice today and to keep appointment tomorrow.

## 2021-02-13 ENCOUNTER — Encounter: Payer: Self-pay | Admitting: Family Medicine

## 2021-02-13 ENCOUNTER — Other Ambulatory Visit: Payer: Self-pay

## 2021-02-13 ENCOUNTER — Ambulatory Visit (INDEPENDENT_AMBULATORY_CARE_PROVIDER_SITE_OTHER): Payer: Medicare PPO | Admitting: Family Medicine

## 2021-02-13 ENCOUNTER — Ambulatory Visit (INDEPENDENT_AMBULATORY_CARE_PROVIDER_SITE_OTHER): Payer: Medicare PPO

## 2021-02-13 VITALS — BP 128/48 | HR 63 | Ht 72.0 in | Wt 234.6 lb

## 2021-02-13 DIAGNOSIS — I480 Paroxysmal atrial fibrillation: Secondary | ICD-10-CM

## 2021-02-13 DIAGNOSIS — S91101D Unspecified open wound of right great toe without damage to nail, subsequent encounter: Secondary | ICD-10-CM | POA: Diagnosis not present

## 2021-02-13 DIAGNOSIS — I5020 Unspecified systolic (congestive) heart failure: Secondary | ICD-10-CM

## 2021-02-13 DIAGNOSIS — Z23 Encounter for immunization: Secondary | ICD-10-CM | POA: Diagnosis not present

## 2021-02-13 DIAGNOSIS — J449 Chronic obstructive pulmonary disease, unspecified: Secondary | ICD-10-CM | POA: Diagnosis not present

## 2021-02-13 DIAGNOSIS — H938X1 Other specified disorders of right ear: Secondary | ICD-10-CM

## 2021-02-13 DIAGNOSIS — Z7901 Long term (current) use of anticoagulants: Secondary | ICD-10-CM | POA: Diagnosis not present

## 2021-02-13 DIAGNOSIS — I2510 Atherosclerotic heart disease of native coronary artery without angina pectoris: Secondary | ICD-10-CM

## 2021-02-13 LAB — POCT INR: INR: 3.1 — AB (ref 2.0–3.0)

## 2021-02-13 MED ORDER — CEPHALEXIN 500 MG PO CAPS: 500.0000 mg | ORAL_CAPSULE | Freq: Four times a day (QID) | ORAL | 0 refills | Status: AC

## 2021-02-13 MED ORDER — FUROSEMIDE 40 MG PO TABS: 40.0000 mg | ORAL_TABLET | Freq: Every day | ORAL | 1 refills | Status: DC | PRN

## 2021-02-13 MED ORDER — FLUTICASONE PROPIONATE 50 MCG/ACT NA SUSP: 2.0000 | Freq: Every day | NASAL | 6 refills | Status: DC

## 2021-02-13 MED ORDER — ALBUTEROL SULFATE HFA 108 (90 BASE) MCG/ACT IN AERS: 2.0000 | INHALATION_SPRAY | Freq: Four times a day (QID) | RESPIRATORY_TRACT | 0 refills | Status: DC | PRN

## 2021-02-13 MED ORDER — CLOPIDOGREL BISULFATE 75 MG PO TABS: 75.0000 mg | ORAL_TABLET | Freq: Every day | ORAL | 0 refills | Status: DC

## 2021-02-13 NOTE — Patient Instructions (Signed)
We will treat the drainage near your toe area with Keflex for 10 days. Please notify our office if you develop fevers, chills or become unable to drink fluids due to vomiting.

## 2021-02-13 NOTE — Progress Notes (Signed)
    SUBJECTIVE:   CHIEF COMPLAINT / HPI: blister on right foot   Pt and wife report he has been dealing with reported "cellulitis" since April. He recently had amputation of the great toe on the right foot with Dr. Sharol Given. Patient denies fever and chills. He denies any pain in his leg or foot.  He asked for refills of lasix, plavix, albuterol to be sent to mail order pharmacy after printing of AVS.   Warfarin Therapy  INR was collected today and patient has been on warfarin therapy, decreased 7.5mg  dosage days to two days (Mond and Friday) and 5mg  for remainder of week due to INR 3.1 today.   PERTINENT  PMH / PSH:  HTN  PVD  HFrEF  AF (on AC therapy)   OBJECTIVE:   BP (!) 128/48   Pulse 63   Ht 6' (1.829 m)   Wt 234 lb 9.6 oz (106.4 kg)   SpO2 97%   BMI 31.82 kg/m   Right Foot:  Inspection:  +obvious bony deformity.remaining four toes with hyperpigmented areas,  + swelling, +erythema surrounding blister, on bruising.  Normal arch Palpation: No tenderness to palpation   ASSESSMENT/PLAN:   Open wound of right great toe Right foot with erythema and large clear fluid filled blister, concerning for ongoing cellulitis without tenderness to palpation  Will prescribe Keflex 500mg  for 10 days course      Rodney Foster, MD [place]

## 2021-02-18 NOTE — Assessment & Plan Note (Addendum)
Right foot with erythema and large clear fluid filled blister, concerning for ongoing cellulitis without tenderness to palpation  Will prescribe Keflex 500mg  for 10 days course

## 2021-02-22 DIAGNOSIS — I350 Nonrheumatic aortic (valve) stenosis: Secondary | ICD-10-CM | POA: Diagnosis not present

## 2021-02-22 DIAGNOSIS — I480 Paroxysmal atrial fibrillation: Secondary | ICD-10-CM | POA: Diagnosis not present

## 2021-02-22 DIAGNOSIS — I5020 Unspecified systolic (congestive) heart failure: Secondary | ICD-10-CM | POA: Diagnosis not present

## 2021-02-22 DIAGNOSIS — I110 Hypertensive heart disease with heart failure: Secondary | ICD-10-CM | POA: Diagnosis not present

## 2021-02-22 DIAGNOSIS — D631 Anemia in chronic kidney disease: Secondary | ICD-10-CM | POA: Diagnosis not present

## 2021-02-22 DIAGNOSIS — Z4781 Encounter for orthopedic aftercare following surgical amputation: Secondary | ICD-10-CM | POA: Diagnosis not present

## 2021-02-22 DIAGNOSIS — N183 Chronic kidney disease, stage 3 unspecified: Secondary | ICD-10-CM | POA: Diagnosis not present

## 2021-02-22 DIAGNOSIS — J449 Chronic obstructive pulmonary disease, unspecified: Secondary | ICD-10-CM | POA: Diagnosis not present

## 2021-02-22 DIAGNOSIS — E1122 Type 2 diabetes mellitus with diabetic chronic kidney disease: Secondary | ICD-10-CM | POA: Diagnosis not present

## 2021-02-25 DIAGNOSIS — D631 Anemia in chronic kidney disease: Secondary | ICD-10-CM | POA: Diagnosis not present

## 2021-02-25 DIAGNOSIS — L89511 Pressure ulcer of right ankle, stage 1: Secondary | ICD-10-CM | POA: Diagnosis not present

## 2021-02-25 DIAGNOSIS — I5020 Unspecified systolic (congestive) heart failure: Secondary | ICD-10-CM | POA: Diagnosis not present

## 2021-02-25 DIAGNOSIS — I872 Venous insufficiency (chronic) (peripheral): Secondary | ICD-10-CM | POA: Diagnosis not present

## 2021-02-25 DIAGNOSIS — E1151 Type 2 diabetes mellitus with diabetic peripheral angiopathy without gangrene: Secondary | ICD-10-CM | POA: Diagnosis not present

## 2021-02-25 DIAGNOSIS — Z4781 Encounter for orthopedic aftercare following surgical amputation: Secondary | ICD-10-CM | POA: Diagnosis not present

## 2021-02-25 DIAGNOSIS — I130 Hypertensive heart and chronic kidney disease with heart failure and stage 1 through stage 4 chronic kidney disease, or unspecified chronic kidney disease: Secondary | ICD-10-CM | POA: Diagnosis not present

## 2021-02-25 DIAGNOSIS — N183 Chronic kidney disease, stage 3 unspecified: Secondary | ICD-10-CM | POA: Diagnosis not present

## 2021-02-25 DIAGNOSIS — E1122 Type 2 diabetes mellitus with diabetic chronic kidney disease: Secondary | ICD-10-CM | POA: Diagnosis not present

## 2021-02-26 ENCOUNTER — Telehealth: Payer: Self-pay

## 2021-02-26 NOTE — Telephone Encounter (Signed)
Kenney Houseman [facility] RN calls nurse line requesting verbal orders for [facility] nursing as follows.  1x a week for 9 weeks 1 PRN   Verbal orders given per [facility] protocol.

## 2021-02-27 ENCOUNTER — Ambulatory Visit: Payer: Medicare PPO

## 2021-02-28 ENCOUNTER — Ambulatory Visit: Payer: Medicare PPO

## 2021-02-28 ENCOUNTER — Ambulatory Visit: Payer: Medicare PPO | Admitting: Cardiovascular Disease

## 2021-03-01 DIAGNOSIS — N183 Chronic kidney disease, stage 3 unspecified: Secondary | ICD-10-CM | POA: Diagnosis not present

## 2021-03-01 DIAGNOSIS — Z4781 Encounter for orthopedic aftercare following surgical amputation: Secondary | ICD-10-CM | POA: Diagnosis not present

## 2021-03-01 DIAGNOSIS — I130 Hypertensive heart and chronic kidney disease with heart failure and stage 1 through stage 4 chronic kidney disease, or unspecified chronic kidney disease: Secondary | ICD-10-CM | POA: Diagnosis not present

## 2021-03-01 DIAGNOSIS — I5020 Unspecified systolic (congestive) heart failure: Secondary | ICD-10-CM | POA: Diagnosis not present

## 2021-03-01 DIAGNOSIS — E1151 Type 2 diabetes mellitus with diabetic peripheral angiopathy without gangrene: Secondary | ICD-10-CM | POA: Diagnosis not present

## 2021-03-01 DIAGNOSIS — E1122 Type 2 diabetes mellitus with diabetic chronic kidney disease: Secondary | ICD-10-CM | POA: Diagnosis not present

## 2021-03-01 DIAGNOSIS — D631 Anemia in chronic kidney disease: Secondary | ICD-10-CM | POA: Diagnosis not present

## 2021-03-01 DIAGNOSIS — I872 Venous insufficiency (chronic) (peripheral): Secondary | ICD-10-CM | POA: Diagnosis not present

## 2021-03-01 DIAGNOSIS — L89511 Pressure ulcer of right ankle, stage 1: Secondary | ICD-10-CM | POA: Diagnosis not present

## 2021-03-03 ENCOUNTER — Other Ambulatory Visit: Payer: Self-pay | Admitting: Family Medicine

## 2021-03-03 DIAGNOSIS — J449 Chronic obstructive pulmonary disease, unspecified: Secondary | ICD-10-CM

## 2021-03-03 DIAGNOSIS — E1151 Type 2 diabetes mellitus with diabetic peripheral angiopathy without gangrene: Secondary | ICD-10-CM | POA: Diagnosis not present

## 2021-03-03 DIAGNOSIS — E1122 Type 2 diabetes mellitus with diabetic chronic kidney disease: Secondary | ICD-10-CM | POA: Diagnosis not present

## 2021-03-03 DIAGNOSIS — L89511 Pressure ulcer of right ankle, stage 1: Secondary | ICD-10-CM | POA: Diagnosis not present

## 2021-03-03 DIAGNOSIS — I872 Venous insufficiency (chronic) (peripheral): Secondary | ICD-10-CM | POA: Diagnosis not present

## 2021-03-03 DIAGNOSIS — Z4781 Encounter for orthopedic aftercare following surgical amputation: Secondary | ICD-10-CM | POA: Diagnosis not present

## 2021-03-03 DIAGNOSIS — D631 Anemia in chronic kidney disease: Secondary | ICD-10-CM | POA: Diagnosis not present

## 2021-03-03 DIAGNOSIS — I130 Hypertensive heart and chronic kidney disease with heart failure and stage 1 through stage 4 chronic kidney disease, or unspecified chronic kidney disease: Secondary | ICD-10-CM | POA: Diagnosis not present

## 2021-03-03 DIAGNOSIS — I5020 Unspecified systolic (congestive) heart failure: Secondary | ICD-10-CM | POA: Diagnosis not present

## 2021-03-03 DIAGNOSIS — N183 Chronic kidney disease, stage 3 unspecified: Secondary | ICD-10-CM | POA: Diagnosis not present

## 2021-03-10 ENCOUNTER — Other Ambulatory Visit: Payer: Self-pay | Admitting: Cardiovascular Disease

## 2021-03-11 DIAGNOSIS — Z4781 Encounter for orthopedic aftercare following surgical amputation: Secondary | ICD-10-CM | POA: Diagnosis not present

## 2021-03-11 DIAGNOSIS — E1122 Type 2 diabetes mellitus with diabetic chronic kidney disease: Secondary | ICD-10-CM | POA: Diagnosis not present

## 2021-03-11 DIAGNOSIS — I5020 Unspecified systolic (congestive) heart failure: Secondary | ICD-10-CM | POA: Diagnosis not present

## 2021-03-11 DIAGNOSIS — L89511 Pressure ulcer of right ankle, stage 1: Secondary | ICD-10-CM | POA: Diagnosis not present

## 2021-03-11 DIAGNOSIS — E1151 Type 2 diabetes mellitus with diabetic peripheral angiopathy without gangrene: Secondary | ICD-10-CM | POA: Diagnosis not present

## 2021-03-11 DIAGNOSIS — I872 Venous insufficiency (chronic) (peripheral): Secondary | ICD-10-CM | POA: Diagnosis not present

## 2021-03-11 DIAGNOSIS — I130 Hypertensive heart and chronic kidney disease with heart failure and stage 1 through stage 4 chronic kidney disease, or unspecified chronic kidney disease: Secondary | ICD-10-CM | POA: Diagnosis not present

## 2021-03-11 DIAGNOSIS — D631 Anemia in chronic kidney disease: Secondary | ICD-10-CM | POA: Diagnosis not present

## 2021-03-11 DIAGNOSIS — N183 Chronic kidney disease, stage 3 unspecified: Secondary | ICD-10-CM | POA: Diagnosis not present

## 2021-03-17 ENCOUNTER — Other Ambulatory Visit: Payer: Self-pay | Admitting: Family Medicine

## 2021-03-17 DIAGNOSIS — I2510 Atherosclerotic heart disease of native coronary artery without angina pectoris: Secondary | ICD-10-CM

## 2021-03-23 DIAGNOSIS — I499 Cardiac arrhythmia, unspecified: Secondary | ICD-10-CM | POA: Diagnosis not present

## 2021-03-23 DIAGNOSIS — R0689 Other abnormalities of breathing: Secondary | ICD-10-CM | POA: Diagnosis not present

## 2021-03-23 DIAGNOSIS — R000 Tachycardia, unspecified: Secondary | ICD-10-CM | POA: Diagnosis not present

## 2021-03-23 DIAGNOSIS — R739 Hyperglycemia, unspecified: Secondary | ICD-10-CM | POA: Diagnosis not present

## 2021-03-23 DIAGNOSIS — J80 Acute respiratory distress syndrome: Secondary | ICD-10-CM | POA: Diagnosis not present

## 2021-03-24 ENCOUNTER — Inpatient Hospital Stay (HOSPITAL_COMMUNITY): Payer: Medicare PPO

## 2021-03-24 ENCOUNTER — Emergency Department (HOSPITAL_COMMUNITY): Payer: Medicare PPO

## 2021-03-24 ENCOUNTER — Inpatient Hospital Stay (HOSPITAL_COMMUNITY)
Admission: EM | Admit: 2021-03-24 | Discharge: 2021-03-28 | DRG: 871 | Disposition: A | Discharge status: Home / Self Care | Payer: Medicare PPO | Attending: Internal Medicine | Admitting: Internal Medicine

## 2021-03-24 ENCOUNTER — Other Ambulatory Visit: Payer: Self-pay

## 2021-03-24 ENCOUNTER — Encounter (HOSPITAL_COMMUNITY): Payer: Self-pay | Admitting: *Deleted

## 2021-03-24 DIAGNOSIS — E8729 Other acidosis: Secondary | ICD-10-CM | POA: Diagnosis present

## 2021-03-24 DIAGNOSIS — I2489 Other forms of acute ischemic heart disease: Secondary | ICD-10-CM | POA: Diagnosis present

## 2021-03-24 DIAGNOSIS — Z20822 Contact with and (suspected) exposure to covid-19: Secondary | ICD-10-CM | POA: Diagnosis present

## 2021-03-24 DIAGNOSIS — J9601 Acute respiratory failure with hypoxia: Secondary | ICD-10-CM | POA: Diagnosis present

## 2021-03-24 DIAGNOSIS — F17210 Nicotine dependence, cigarettes, uncomplicated: Secondary | ICD-10-CM | POA: Diagnosis present

## 2021-03-24 DIAGNOSIS — I130 Hypertensive heart and chronic kidney disease with heart failure and stage 1 through stage 4 chronic kidney disease, or unspecified chronic kidney disease: Secondary | ICD-10-CM | POA: Diagnosis present

## 2021-03-24 DIAGNOSIS — E1169 Type 2 diabetes mellitus with other specified complication: Secondary | ICD-10-CM

## 2021-03-24 DIAGNOSIS — E1149 Type 2 diabetes mellitus with other diabetic neurological complication: Secondary | ICD-10-CM | POA: Diagnosis present

## 2021-03-24 DIAGNOSIS — I700 Atherosclerosis of aorta: Secondary | ICD-10-CM | POA: Diagnosis not present

## 2021-03-24 DIAGNOSIS — J449 Chronic obstructive pulmonary disease, unspecified: Secondary | ICD-10-CM | POA: Diagnosis present

## 2021-03-24 DIAGNOSIS — J1100 Influenza due to unidentified influenza virus with unspecified type of pneumonia: Secondary | ICD-10-CM | POA: Diagnosis not present

## 2021-03-24 DIAGNOSIS — J90 Pleural effusion, not elsewhere classified: Secondary | ICD-10-CM | POA: Diagnosis not present

## 2021-03-24 DIAGNOSIS — I480 Paroxysmal atrial fibrillation: Secondary | ICD-10-CM | POA: Diagnosis present

## 2021-03-24 DIAGNOSIS — R918 Other nonspecific abnormal finding of lung field: Secondary | ICD-10-CM | POA: Diagnosis not present

## 2021-03-24 DIAGNOSIS — E1122 Type 2 diabetes mellitus with diabetic chronic kidney disease: Secondary | ICD-10-CM | POA: Diagnosis not present

## 2021-03-24 DIAGNOSIS — I482 Chronic atrial fibrillation, unspecified: Secondary | ICD-10-CM | POA: Diagnosis present

## 2021-03-24 DIAGNOSIS — Z79899 Other long term (current) drug therapy: Secondary | ICD-10-CM

## 2021-03-24 DIAGNOSIS — E875 Hyperkalemia: Secondary | ICD-10-CM | POA: Diagnosis present

## 2021-03-24 DIAGNOSIS — Z7901 Long term (current) use of anticoagulants: Secondary | ICD-10-CM

## 2021-03-24 DIAGNOSIS — N1831 Chronic kidney disease, stage 3a: Secondary | ICD-10-CM | POA: Diagnosis present

## 2021-03-24 DIAGNOSIS — J159 Unspecified bacterial pneumonia: Secondary | ICD-10-CM | POA: Diagnosis not present

## 2021-03-24 DIAGNOSIS — J440 Chronic obstructive pulmonary disease with acute lower respiratory infection: Secondary | ICD-10-CM | POA: Diagnosis present

## 2021-03-24 DIAGNOSIS — Z4659 Encounter for fitting and adjustment of other gastrointestinal appliance and device: Secondary | ICD-10-CM

## 2021-03-24 DIAGNOSIS — Z4781 Encounter for orthopedic aftercare following surgical amputation: Secondary | ICD-10-CM | POA: Diagnosis not present

## 2021-03-24 DIAGNOSIS — I083 Combined rheumatic disorders of mitral, aortic and tricuspid valves: Secondary | ICD-10-CM | POA: Diagnosis present

## 2021-03-24 DIAGNOSIS — I5020 Unspecified systolic (congestive) heart failure: Secondary | ICD-10-CM | POA: Diagnosis not present

## 2021-03-24 DIAGNOSIS — I10 Essential (primary) hypertension: Secondary | ICD-10-CM | POA: Diagnosis present

## 2021-03-24 DIAGNOSIS — A419 Sepsis, unspecified organism: Secondary | ICD-10-CM | POA: Diagnosis not present

## 2021-03-24 DIAGNOSIS — I21A1 Myocardial infarction type 2: Secondary | ICD-10-CM | POA: Diagnosis not present

## 2021-03-24 DIAGNOSIS — E7800 Pure hypercholesterolemia, unspecified: Secondary | ICD-10-CM | POA: Diagnosis present

## 2021-03-24 DIAGNOSIS — J101 Influenza due to other identified influenza virus with other respiratory manifestations: Secondary | ICD-10-CM

## 2021-03-24 DIAGNOSIS — I214 Non-ST elevation (NSTEMI) myocardial infarction: Secondary | ICD-10-CM

## 2021-03-24 DIAGNOSIS — E1151 Type 2 diabetes mellitus with diabetic peripheral angiopathy without gangrene: Secondary | ICD-10-CM | POA: Diagnosis not present

## 2021-03-24 DIAGNOSIS — I248 Other forms of acute ischemic heart disease: Secondary | ICD-10-CM | POA: Diagnosis present

## 2021-03-24 DIAGNOSIS — Z86718 Personal history of other venous thrombosis and embolism: Secondary | ICD-10-CM

## 2021-03-24 DIAGNOSIS — Z6831 Body mass index (BMI) 31.0-31.9, adult: Secondary | ICD-10-CM | POA: Diagnosis not present

## 2021-03-24 DIAGNOSIS — K6389 Other specified diseases of intestine: Secondary | ICD-10-CM | POA: Diagnosis not present

## 2021-03-24 DIAGNOSIS — N179 Acute kidney failure, unspecified: Secondary | ICD-10-CM | POA: Diagnosis not present

## 2021-03-24 DIAGNOSIS — R6520 Severe sepsis without septic shock: Secondary | ICD-10-CM | POA: Diagnosis present

## 2021-03-24 DIAGNOSIS — K5900 Constipation, unspecified: Secondary | ICD-10-CM | POA: Diagnosis present

## 2021-03-24 DIAGNOSIS — J9801 Acute bronchospasm: Secondary | ICD-10-CM | POA: Diagnosis present

## 2021-03-24 DIAGNOSIS — D638 Anemia in other chronic diseases classified elsewhere: Secondary | ICD-10-CM | POA: Diagnosis present

## 2021-03-24 DIAGNOSIS — J9611 Chronic respiratory failure with hypoxia: Secondary | ICD-10-CM | POA: Diagnosis present

## 2021-03-24 DIAGNOSIS — Z4682 Encounter for fitting and adjustment of non-vascular catheter: Secondary | ICD-10-CM | POA: Diagnosis not present

## 2021-03-24 DIAGNOSIS — L89511 Pressure ulcer of right ankle, stage 1: Secondary | ICD-10-CM | POA: Diagnosis not present

## 2021-03-24 DIAGNOSIS — J9602 Acute respiratory failure with hypercapnia: Secondary | ICD-10-CM | POA: Diagnosis present

## 2021-03-24 DIAGNOSIS — N183 Chronic kidney disease, stage 3 unspecified: Secondary | ICD-10-CM | POA: Diagnosis not present

## 2021-03-24 DIAGNOSIS — I739 Peripheral vascular disease, unspecified: Secondary | ICD-10-CM | POA: Diagnosis present

## 2021-03-24 DIAGNOSIS — D631 Anemia in chronic kidney disease: Secondary | ICD-10-CM | POA: Diagnosis not present

## 2021-03-24 DIAGNOSIS — J1008 Influenza due to other identified influenza virus with other specified pneumonia: Secondary | ICD-10-CM | POA: Diagnosis not present

## 2021-03-24 DIAGNOSIS — Z7902 Long term (current) use of antithrombotics/antiplatelets: Secondary | ICD-10-CM

## 2021-03-24 DIAGNOSIS — I5041 Acute combined systolic (congestive) and diastolic (congestive) heart failure: Secondary | ICD-10-CM | POA: Diagnosis present

## 2021-03-24 DIAGNOSIS — Z7984 Long term (current) use of oral hypoglycemic drugs: Secondary | ICD-10-CM

## 2021-03-24 DIAGNOSIS — I350 Nonrheumatic aortic (valve) stenosis: Secondary | ICD-10-CM

## 2021-03-24 DIAGNOSIS — J09X1 Influenza due to identified novel influenza A virus with pneumonia: Secondary | ICD-10-CM | POA: Diagnosis present

## 2021-03-24 DIAGNOSIS — I2510 Atherosclerotic heart disease of native coronary artery without angina pectoris: Secondary | ICD-10-CM | POA: Diagnosis present

## 2021-03-24 DIAGNOSIS — N189 Chronic kidney disease, unspecified: Secondary | ICD-10-CM | POA: Diagnosis present

## 2021-03-24 DIAGNOSIS — Z89512 Acquired absence of left leg below knee: Secondary | ICD-10-CM

## 2021-03-24 DIAGNOSIS — Z794 Long term (current) use of insulin: Secondary | ICD-10-CM

## 2021-03-24 DIAGNOSIS — D509 Iron deficiency anemia, unspecified: Secondary | ICD-10-CM | POA: Diagnosis present

## 2021-03-24 DIAGNOSIS — E669 Obesity, unspecified: Secondary | ICD-10-CM | POA: Diagnosis present

## 2021-03-24 DIAGNOSIS — J441 Chronic obstructive pulmonary disease with (acute) exacerbation: Secondary | ICD-10-CM | POA: Diagnosis present

## 2021-03-24 DIAGNOSIS — R791 Abnormal coagulation profile: Secondary | ICD-10-CM | POA: Diagnosis present

## 2021-03-24 DIAGNOSIS — I872 Venous insufficiency (chronic) (peripheral): Secondary | ICD-10-CM | POA: Diagnosis not present

## 2021-03-24 LAB — CBC WITH DIFFERENTIAL/PLATELET
Abs Immature Granulocytes: 0.09 10*3/uL — ABNORMAL HIGH (ref 0.00–0.07)
Basophils Absolute: 0.1 10*3/uL (ref 0.0–0.1)
Basophils Relative: 0 %
Eosinophils Absolute: 0 10*3/uL (ref 0.0–0.5)
Eosinophils Relative: 0 %
HCT: 30.2 % — ABNORMAL LOW (ref 39.0–52.0)
Hemoglobin: 8.9 g/dL — ABNORMAL LOW (ref 13.0–17.0)
Immature Granulocytes: 1 %
Lymphocytes Relative: 8 %
Lymphs Abs: 0.9 10*3/uL (ref 0.7–4.0)
MCH: 25.7 pg — ABNORMAL LOW (ref 26.0–34.0)
MCHC: 29.5 g/dL — ABNORMAL LOW (ref 30.0–36.0)
MCV: 87.3 fL (ref 80.0–100.0)
Monocytes Absolute: 0.6 10*3/uL (ref 0.1–1.0)
Monocytes Relative: 5 %
Neutro Abs: 9.6 10*3/uL — ABNORMAL HIGH (ref 1.7–7.7)
Neutrophils Relative %: 86 %
Platelets: 324 10*3/uL (ref 150–400)
RBC: 3.46 MIL/uL — ABNORMAL LOW (ref 4.22–5.81)
RDW: 14.6 % (ref 11.5–15.5)
WBC: 11.3 10*3/uL — ABNORMAL HIGH (ref 4.0–10.5)
nRBC: 0 % (ref 0.0–0.2)

## 2021-03-24 LAB — GLUCOSE, CAPILLARY
Glucose-Capillary: 186 mg/dL — ABNORMAL HIGH (ref 70–99)
Glucose-Capillary: 182 mg/dL — ABNORMAL HIGH (ref 70–99)
Glucose-Capillary: 154 mg/dL — ABNORMAL HIGH (ref 70–99)
Glucose-Capillary: 138 mg/dL — ABNORMAL HIGH (ref 70–99)

## 2021-03-24 LAB — BLOOD GAS, ARTERIAL
Acid-base deficit: 0.6 mmol/L (ref 0.0–2.0)
Acid-base deficit: 2.3 mmol/L — ABNORMAL HIGH (ref 0.0–2.0)
Bicarbonate: 23 mmol/L (ref 20.0–28.0)
Bicarbonate: 26.6 mmol/L (ref 20.0–28.0)
FIO2: 100
FIO2: 60
MECHVT: 500 mL
O2 Saturation: 99.2 %
O2 Saturation: 99.9 %
PEEP: 5 cmH2O
Patient temperature: 100.7
Patient temperature: 102.2
RATE: 28 resp/min
pCO2 arterial: 50 mmHg — ABNORMAL HIGH (ref 32.0–48.0)
pCO2 arterial: 64.8 mmHg — ABNORMAL HIGH (ref 32.0–48.0)
pH, Arterial: 7.244 — ABNORMAL LOW (ref 7.350–7.450)
pH, Arterial: 7.297 — ABNORMAL LOW (ref 7.350–7.450)
pO2, Arterial: 165 mmHg — ABNORMAL HIGH (ref 83.0–108.0)
pO2, Arterial: 370 mmHg — ABNORMAL HIGH (ref 83.0–108.0)

## 2021-03-24 LAB — LACTIC ACID, PLASMA
Lactic Acid, Venous: 1.7 mmol/L (ref 0.5–1.9)
Lactic Acid, Venous: 2 mmol/L (ref 0.5–1.9)

## 2021-03-24 LAB — URINALYSIS, ROUTINE W REFLEX MICROSCOPIC
Bacteria, UA: NONE SEEN
Bilirubin Urine: NEGATIVE
Glucose, UA: NEGATIVE mg/dL
Ketones, ur: NEGATIVE mg/dL
Leukocytes,Ua: NEGATIVE
Nitrite: NEGATIVE
Protein, ur: >=300 mg/dL — AB
Specific Gravity, Urine: 1.025 (ref 1.005–1.030)
pH: 5 (ref 5.0–8.0)

## 2021-03-24 LAB — COMPREHENSIVE METABOLIC PANEL
ALT: 54 U/L — ABNORMAL HIGH (ref 0–44)
AST: 56 U/L — ABNORMAL HIGH (ref 15–41)
Albumin: 3.8 g/dL (ref 3.5–5.0)
Alkaline Phosphatase: 81 U/L (ref 38–126)
Anion gap: 8 (ref 5–15)
BUN: 29 mg/dL — ABNORMAL HIGH (ref 8–23)
CO2: 27 mmol/L (ref 22–32)
Calcium: 8.4 mg/dL — ABNORMAL LOW (ref 8.9–10.3)
Chloride: 99 mmol/L (ref 98–111)
Creatinine, Ser: 1.51 mg/dL — ABNORMAL HIGH (ref 0.61–1.24)
GFR, Estimated: 50 mL/min — ABNORMAL LOW (ref >60)
Glucose, Bld: 201 mg/dL — ABNORMAL HIGH (ref 70–99)
Potassium: 5.7 mmol/L — ABNORMAL HIGH (ref 3.5–5.1)
Sodium: 134 mmol/L — ABNORMAL LOW (ref 135–145)
Total Bilirubin: 0.9 mg/dL (ref 0.3–1.2)
Total Protein: 7.8 g/dL (ref 6.5–8.1)

## 2021-03-24 LAB — ECHOCARDIOGRAM LIMITED
AR max vel: 0.57 cm2
AV Area VTI: 0.57 cm2
AV Area mean vel: 0.6 cm2
AV Mean grad: 31.5 mmHg
AV Peak grad: 58.8 mmHg
Ao pk vel: 3.84 m/s
Area-P 1/2: 3.17 cm2
Calc EF: 41.4 %
Height: 72 in
MV VTI: 1.42 cm2
S' Lateral: 4.1 cm
Single Plane A2C EF: 44.3 %
Single Plane A4C EF: 44.2 %
Weight: 3774.28 oz

## 2021-03-24 LAB — RESP PANEL BY RT-PCR (FLU A&B, COVID) ARPGX2
Influenza A by PCR: POSITIVE — AB
Influenza B by PCR: NEGATIVE
SARS Coronavirus 2 by RT PCR: NEGATIVE

## 2021-03-24 LAB — PROCALCITONIN: Procalcitonin: 0.74 ng/mL

## 2021-03-24 LAB — PROTIME-INR
INR: 2.7 — ABNORMAL HIGH (ref 0.8–1.2)
Prothrombin Time: 28.3 seconds — ABNORMAL HIGH (ref 11.4–15.2)

## 2021-03-24 LAB — MRSA NEXT GEN BY PCR, NASAL: MRSA by PCR Next Gen: NOT DETECTED

## 2021-03-24 LAB — BRAIN NATRIURETIC PEPTIDE: B Natriuretic Peptide: 561.5 pg/mL — ABNORMAL HIGH (ref 0.0–100.0)

## 2021-03-24 LAB — TROPONIN I (HIGH SENSITIVITY)
Troponin I (High Sensitivity): 192 ng/L (ref <18)
Troponin I (High Sensitivity): 1274 ng/L (ref <18)

## 2021-03-24 IMAGING — DX DG CHEST 1V PORT
1 series · 1 of 1 positions shown · non-contrast
Comparison: Earlier radiograph dated [DATE].

CLINICAL DATA: Status post intubation.

EXAM:
PORTABLE CHEST 1 VIEW

[chest ap]
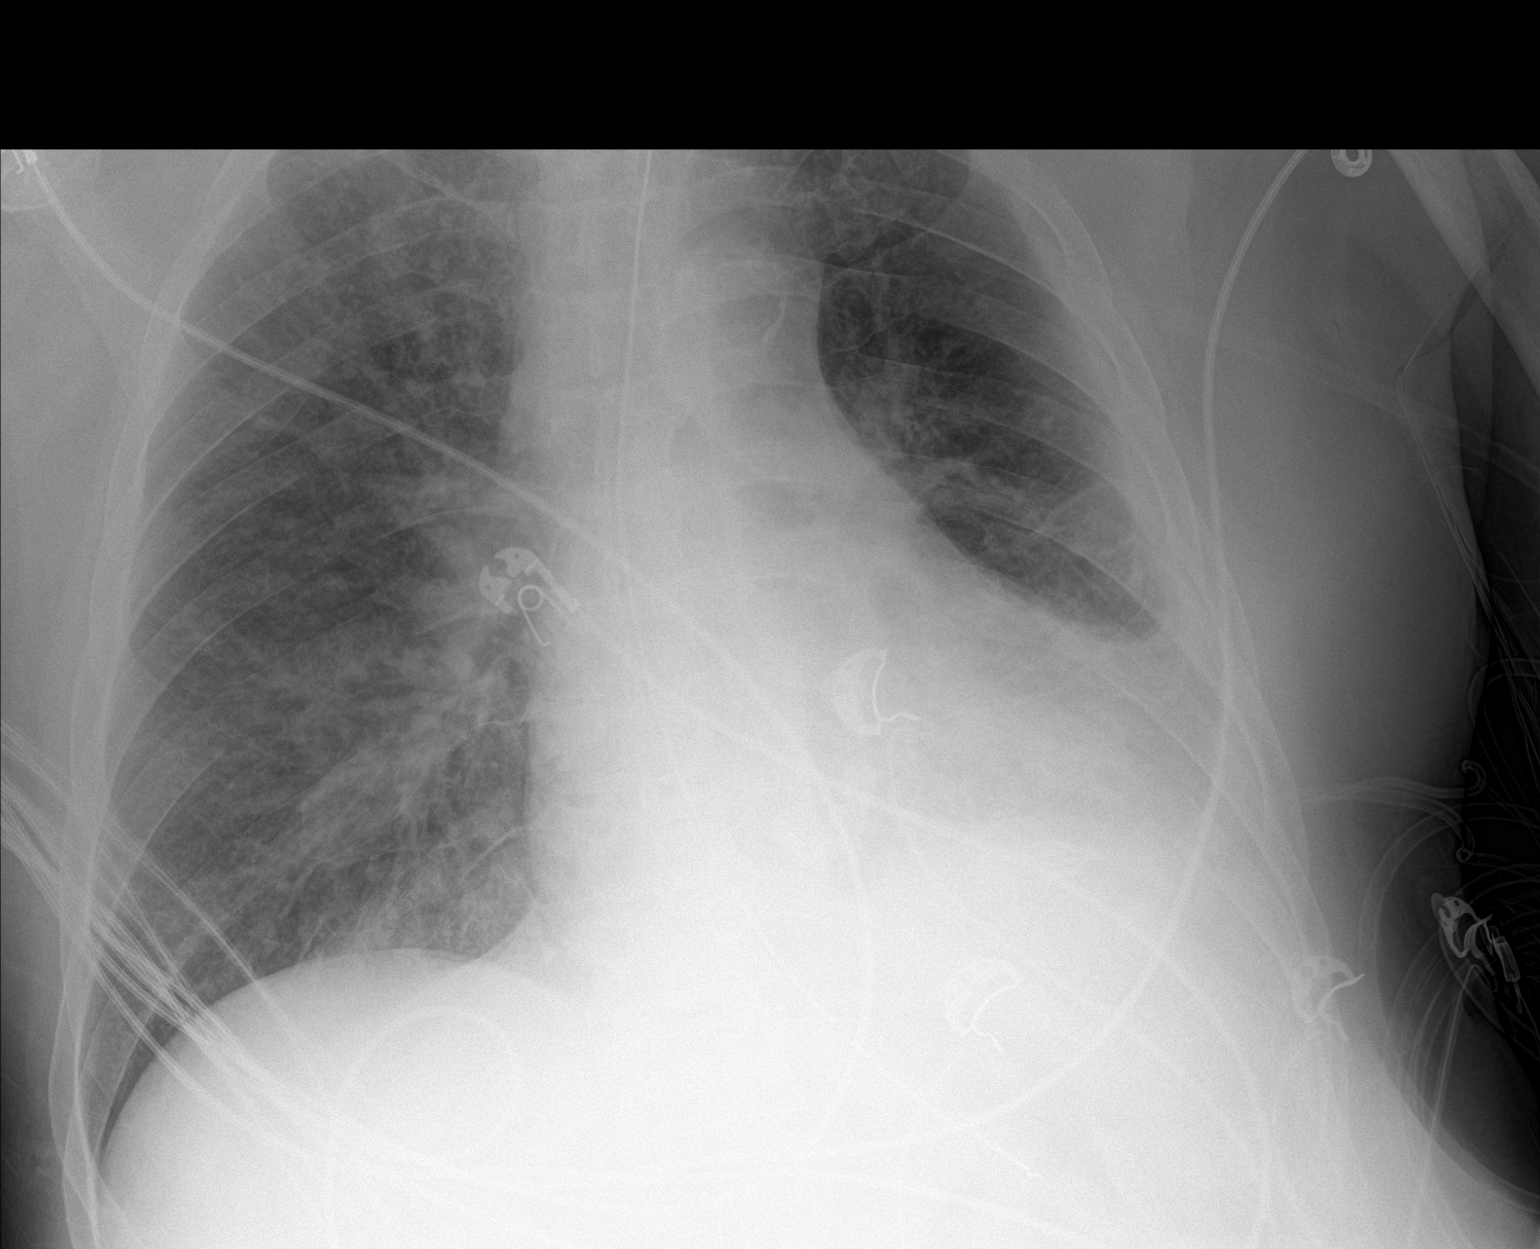

[1 of 1 positions shown; findings below may reference images not displayed]

FINDINGS: Endotracheal tube with tip approximately 6 cm above the carina.
Enteric tube extends below the diaphragm with side-port just distal
to the GE junction and tip in the proximal stomach. Recommend
further advancing of the tube by approximately 5 cm for optimal
positioning.

Diffuse interstitial densities and left lung base opacity appears
similar to prior radiograph. No pneumothorax. Stable
cardiomediastinal silhouette. No acute osseous pathology.
IMPRESSION: 1. Endotracheal tube above the carina.
2. Enteric tube with tip in the proximal stomach. Recommend further
advancing of the tube by approximately 5 cm for optimal positioning.
3. No interval change in pulmonary densities.

## 2021-03-24 IMAGING — DX DG ABDOMEN 1V
1 series · 1 of 1 positions shown · non-contrast
Comparison: None.

CLINICAL DATA: OG tube placement.

EXAM:
ABDOMEN - 1 VIEW

[abdomen kub]
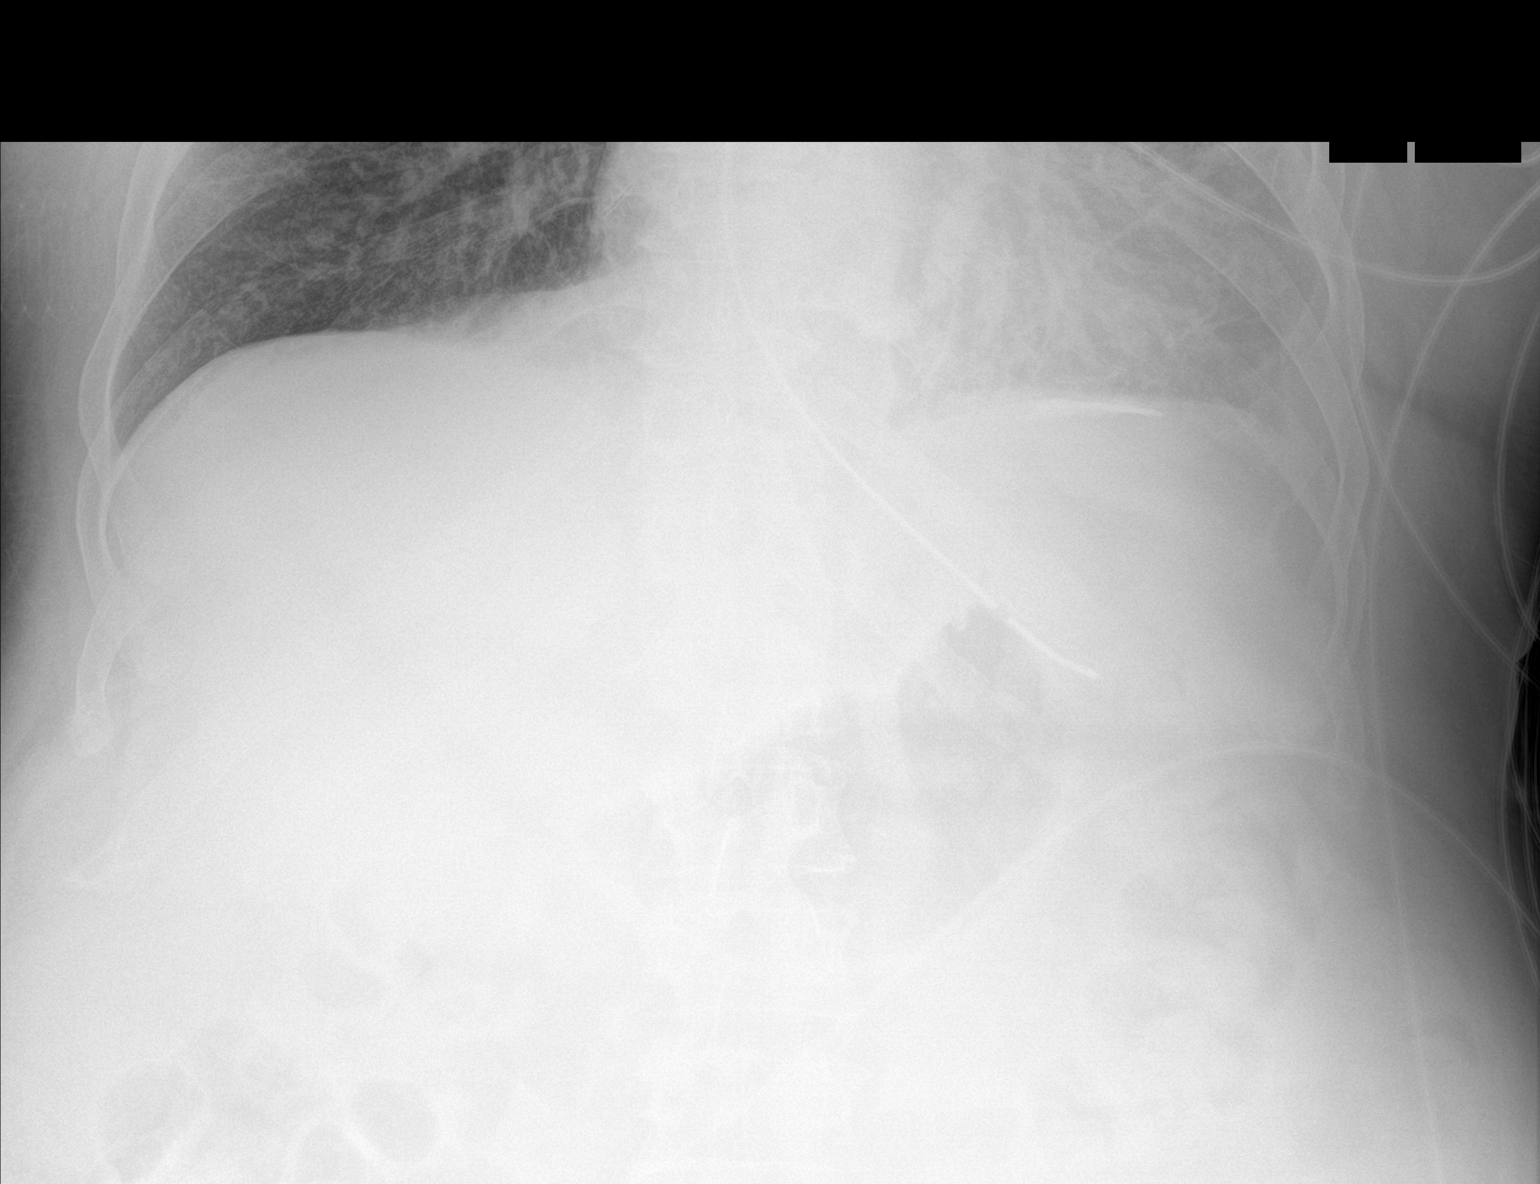

[1 of 1 positions shown; findings below may reference images not displayed]

FINDINGS: [MO] hours. The OG tube tip is positioned in the mid stomach with
proximal side port below the GE junction. Gaseous distention of
colon noted upper abdomen.
IMPRESSION: OG tube tip is in the mid stomach.

## 2021-03-24 MED ORDER — OSELTAMIVIR PHOSPHATE 6 MG/ML PO SUSR
30.0000 mg | Freq: Two times a day (BID) | ORAL | Status: DC
  Administered 2021-03-24 (×2): 30 mg
  Filled 2021-03-24 (×4): qty 12.5

## 2021-03-24 MED ORDER — ETOMIDATE 2 MG/ML IV SOLN
INTRAVENOUS | Status: AC | PRN
  Administered 2021-03-24: 30 mg via INTRAVENOUS

## 2021-03-24 MED ORDER — LORAZEPAM 2 MG/ML IJ SOLN
1.0000 mg | Freq: Once | INTRAMUSCULAR | Status: AC
  Administered 2021-03-24: 01:00:00 1 mg via INTRAVENOUS
  Filled 2021-03-24: qty 1

## 2021-03-24 MED ORDER — FUROSEMIDE 10 MG/ML IJ SOLN
40.0000 mg | Freq: Four times a day (QID) | INTRAMUSCULAR | Status: AC
  Administered 2021-03-24 (×2): 40 mg via INTRAVENOUS
  Filled 2021-03-24 (×2): qty 4

## 2021-03-24 MED ORDER — ONDANSETRON HCL 4 MG/2ML IJ SOLN: 4.0000 mg | Freq: Four times a day (QID) | INTRAMUSCULAR | Status: DC | PRN

## 2021-03-24 MED ORDER — PROPOFOL 1000 MG/100ML IV EMUL: 5.0000 ug/kg/min | INTRAVENOUS | Status: DC

## 2021-03-24 MED ORDER — ARFORMOTEROL TARTRATE 15 MCG/2ML IN NEBU
15.0000 ug | INHALATION_SOLUTION | Freq: Two times a day (BID) | RESPIRATORY_TRACT | Status: DC
  Administered 2021-03-24 – 2021-03-28 (×9): 15 ug via RESPIRATORY_TRACT
  Filled 2021-03-24 (×9): qty 2

## 2021-03-24 MED ORDER — ALBUTEROL SULFATE (2.5 MG/3ML) 0.083% IN NEBU
5.0000 mg | INHALATION_SOLUTION | Freq: Once | RESPIRATORY_TRACT | Status: AC
  Administered 2021-03-24: 01:00:00 5 mg via RESPIRATORY_TRACT

## 2021-03-24 MED ORDER — CHLORHEXIDINE GLUCONATE CLOTH 2 % EX PADS
6.0000 | MEDICATED_PAD | Freq: Every day | CUTANEOUS | Status: DC
  Administered 2021-03-24 – 2021-03-27 (×4): 6 via TOPICAL

## 2021-03-24 MED ORDER — HEPARIN SODIUM (PORCINE) 5000 UNIT/ML IJ SOLN
5000.0000 [IU] | Freq: Three times a day (TID) | INTRAMUSCULAR | Status: DC
  Administered 2021-03-24: 07:00:00 5000 [IU] via SUBCUTANEOUS
  Filled 2021-03-24: qty 1

## 2021-03-24 MED ORDER — FENTANYL 2500MCG IN NS 250ML (10MCG/ML) PREMIX INFUSION
25.0000 ug/h | INTRAVENOUS | Status: DC
  Administered 2021-03-24: 25 ug/h via INTRAVENOUS

## 2021-03-24 MED ORDER — SODIUM CHLORIDE 0.9 % IV SOLN: INTRAVENOUS | Status: DC | PRN

## 2021-03-24 MED ORDER — SODIUM CHLORIDE 0.9 % IV SOLN: 1.0000 g | INTRAVENOUS | Status: DC

## 2021-03-24 MED ORDER — PERFLUTREN LIPID MICROSPHERE
1.0000 mL | INTRAVENOUS | Status: AC | PRN
Start: 2021-03-24 — End: 2021-03-24
  Administered 2021-03-24: 14:00:00 2 mL via INTRAVENOUS
  Filled 2021-03-24: qty 10

## 2021-03-24 MED ORDER — POLYETHYLENE GLYCOL 3350 17 G PO PACK: 17.0000 g | PACK | Freq: Every day | ORAL | Status: DC | PRN

## 2021-03-24 MED ORDER — ROCURONIUM BROMIDE 50 MG/5ML IV SOLN
INTRAVENOUS | Status: AC | PRN
  Administered 2021-03-24: 100 mg via INTRAVENOUS

## 2021-03-24 MED ORDER — MIDAZOLAM HCL 2 MG/2ML IJ SOLN
INTRAMUSCULAR | Status: AC
  Filled 2021-03-24: qty 2

## 2021-03-24 MED ORDER — ROCURONIUM BROMIDE 10 MG/ML (PF) SYRINGE
PREFILLED_SYRINGE | INTRAVENOUS | Status: AC
  Filled 2021-03-24: qty 10

## 2021-03-24 MED ORDER — SODIUM CHLORIDE 0.9 % IV BOLUS
1000.0000 mL | Freq: Once | INTRAVENOUS | Status: AC
  Administered 2021-03-24: 04:00:00 1000 mL via INTRAVENOUS

## 2021-03-24 MED ORDER — OSELTAMIVIR PHOSPHATE 6 MG/ML PO SUSR: 75.0000 mg | Freq: Two times a day (BID) | ORAL | Status: DC

## 2021-03-24 MED ORDER — ACETAMINOPHEN 650 MG RE SUPP
650.0000 mg | Freq: Once | RECTAL | Status: AC
  Administered 2021-03-24: 04:00:00 650 mg via RECTAL
  Filled 2021-03-24: qty 1

## 2021-03-24 MED ORDER — SODIUM CHLORIDE 0.9 % IV SOLN
500.0000 mg | INTRAVENOUS | Status: DC
  Administered 2021-03-24 – 2021-03-25 (×3): 500 mg via INTRAVENOUS
  Filled 2021-03-24 (×3): qty 500

## 2021-03-24 MED ORDER — ALBUTEROL SULFATE (2.5 MG/3ML) 0.083% IN NEBU
INHALATION_SOLUTION | RESPIRATORY_TRACT | Status: AC
  Filled 2021-03-24: qty 6

## 2021-03-24 MED ORDER — CHLORHEXIDINE GLUCONATE CLOTH 2 % EX PADS: 6.0000 | MEDICATED_PAD | Freq: Every day | CUTANEOUS | Status: DC

## 2021-03-24 MED ORDER — INSULIN ASPART 100 UNIT/ML IJ SOLN
0.0000 [IU] | INTRAMUSCULAR | Status: DC
  Administered 2021-03-24: 17:00:00 4 [IU] via SUBCUTANEOUS
  Administered 2021-03-24: 20:00:00 3 [IU] via SUBCUTANEOUS
  Administered 2021-03-24 (×2): 4 [IU] via SUBCUTANEOUS
  Administered 2021-03-25 (×5): 3 [IU] via SUBCUTANEOUS
  Administered 2021-03-25 (×2): 7 [IU] via SUBCUTANEOUS
  Administered 2021-03-26: 4 [IU] via SUBCUTANEOUS
  Filled 2021-03-24: qty 0.2

## 2021-03-24 MED ORDER — WARFARIN - PHARMACIST DOSING INPATIENT: Freq: Every day | Status: DC

## 2021-03-24 MED ORDER — NOREPINEPHRINE 4 MG/250ML-% IV SOLN: 0.0000 ug/min | INTRAVENOUS | Status: DC

## 2021-03-24 MED ORDER — NOREPINEPHRINE 4 MG/250ML-% IV SOLN
INTRAVENOUS | Status: AC
  Administered 2021-03-24: 05:00:00 2 ug/min via INTRAVENOUS
  Filled 2021-03-24: qty 250

## 2021-03-24 MED ORDER — DOCUSATE SODIUM 100 MG PO CAPS: 100.0000 mg | ORAL_CAPSULE | Freq: Two times a day (BID) | ORAL | Status: DC | PRN

## 2021-03-24 MED ORDER — REVEFENACIN 175 MCG/3ML IN SOLN
175.0000 ug | Freq: Every day | RESPIRATORY_TRACT | Status: DC
  Administered 2021-03-24 – 2021-03-28 (×5): 175 ug via RESPIRATORY_TRACT
  Filled 2021-03-24 (×5): qty 3

## 2021-03-24 MED ORDER — IPRATROPIUM-ALBUTEROL 0.5-2.5 (3) MG/3ML IN SOLN: 3.0000 mL | RESPIRATORY_TRACT | Status: DC | PRN

## 2021-03-24 MED ORDER — CHLORHEXIDINE GLUCONATE 0.12% ORAL RINSE (MEDLINE KIT)
15.0000 mL | Freq: Two times a day (BID) | OROMUCOSAL | Status: DC
  Administered 2021-03-24 – 2021-03-25 (×3): 15 mL via OROMUCOSAL

## 2021-03-24 MED ORDER — METHYLPREDNISOLONE SODIUM SUCC 125 MG IJ SOLR
80.0000 mg | Freq: Every day | INTRAMUSCULAR | Status: DC
  Administered 2021-03-24 – 2021-03-25 (×2): 80 mg via INTRAVENOUS
  Filled 2021-03-24 (×2): qty 2

## 2021-03-24 MED ORDER — ETOMIDATE 2 MG/ML IV SOLN
INTRAVENOUS | Status: AC
  Filled 2021-03-24: qty 20

## 2021-03-24 MED ORDER — LACTATED RINGERS IV SOLN: INTRAVENOUS | Status: DC

## 2021-03-24 MED ORDER — SODIUM CHLORIDE 0.9 % IV SOLN
2.0000 g | Freq: Once | INTRAVENOUS | Status: AC
  Administered 2021-03-24: 01:00:00 2 g via INTRAVENOUS
  Filled 2021-03-24: qty 20

## 2021-03-24 MED ORDER — WARFARIN SODIUM 4 MG PO TABS
4.0000 mg | ORAL_TABLET | Freq: Once | ORAL | Status: AC
  Administered 2021-03-24: 17:00:00 4 mg
  Filled 2021-03-24: qty 1

## 2021-03-24 MED ORDER — FENTANYL CITRATE PF 50 MCG/ML IJ SOSY: 25.0000 ug | PREFILLED_SYRINGE | Freq: Once | INTRAMUSCULAR | Status: AC

## 2021-03-24 MED ORDER — PANTOPRAZOLE SODIUM 40 MG IV SOLR
40.0000 mg | Freq: Every day | INTRAVENOUS | Status: DC
  Administered 2021-03-24 – 2021-03-25 (×2): 40 mg via INTRAVENOUS
  Filled 2021-03-24 (×2): qty 40

## 2021-03-24 MED ORDER — PROPOFOL 1000 MG/100ML IV EMUL
INTRAVENOUS | Status: AC
  Administered 2021-03-24: 03:00:00 5 ug/kg/min via INTRAVENOUS
  Filled 2021-03-24: qty 100

## 2021-03-24 MED ORDER — SODIUM CHLORIDE 0.9 % IV SOLN
2.0000 g | INTRAVENOUS | Status: DC
  Administered 2021-03-24 – 2021-03-25 (×2): 2 g via INTRAVENOUS
  Filled 2021-03-24 (×3): qty 20

## 2021-03-24 MED ORDER — ORAL CARE MOUTH RINSE
15.0000 mL | OROMUCOSAL | Status: DC
  Administered 2021-03-24 – 2021-03-25 (×10): 15 mL via OROMUCOSAL

## 2021-03-24 MED ORDER — FENTANYL BOLUS VIA INFUSION
25.0000 ug | INTRAVENOUS | Status: DC | PRN
  Administered 2021-03-24 (×2): 50 ug via INTRAVENOUS
  Filled 2021-03-24: qty 100

## 2021-03-24 MED ORDER — FENTANYL CITRATE PF 50 MCG/ML IJ SOSY
PREFILLED_SYRINGE | INTRAMUSCULAR | Status: AC
  Administered 2021-03-24: 03:00:00 25 ug via INTRAVENOUS
  Filled 2021-03-24: qty 2

## 2021-03-24 MED ORDER — OSELTAMIVIR PHOSPHATE 30 MG PO CAPS
30.0000 mg | ORAL_CAPSULE | Freq: Two times a day (BID) | ORAL | Status: DC
  Filled 2021-03-24: qty 1

## 2021-03-24 NOTE — ED Triage Notes (Signed)
Pt is here by [employer] from home due to SOB. EMS reports that sob began today and he was 58% on RA on ems arrival.  Pt was placed on NRB 100% and came up to 98-100% on 15L NRB.  Pt had 125mg  solumedrol and 5mg  albuterol by ems pta.  EMS reports cbg was 154.  Pt appears sob, accessory muscle use on arrival and is not able to verbally respond.  Pt has cellulitis on right lower leg and per ems this is his usual, at this time it appears that his sock has been on for a long time and there is oozing and swelling.

## 2021-03-24 NOTE — ED Notes (Signed)
Duwayne Heck, MD aware of patient's abnormal labs

## 2021-03-24 NOTE — Progress Notes (Signed)
[facility] Progress Note Patient Name: Rodney [name] DOB: 03/08/1951 MRN: [id number]   Date of Service  03/24/2021  HPI/Events of Note  - pt with influenza - resp failure; intubated  eICU Interventions  - on vent - on pressors - RN's in room     Intervention Category Evaluation Type: New Patient Evaluation  Jamerion Cabello 03/24/2021, 6:57 AM

## 2021-03-24 NOTE — ED Notes (Addendum)
Date and time results received: 03/24/21 0223 (use smartphrase ".now" to insert current time)  Test: trp Critical Value: 192  Name of Provider Notified: [place]  Orders Received? Or Actions Taken?:  n/a

## 2021-03-24 NOTE — Progress Notes (Addendum)
ANTICOAGULATION CONSULT NOTE - Initial Consult  Pharmacy Consult for warfarin Indication: atrial fibrillation     No Known Allergies  Patient Measurements: Height: 6' (182.9 cm) Weight: 107 kg (235 lb 14.3 oz) IBW/kg (Calculated) : 77.6 Heparin Dosing Weight: 100 kg  Vital Signs: Temp: 98.2 F (36.8 C) (11/28 0900) Temp Source: Core (11/28 0400) BP: 138/65 (11/28 0900) Pulse Rate: 63 (11/28 0900)  Labs: Recent Labs    03/24/21 0026 03/24/21 0350  HGB 8.9*  --   HCT 30.2*  --   PLT 324  --   LABPROT 28.3*  --   INR 2.7*  --   CREATININE 1.51*  --   TROPONINIHS 192* 1,274*     Estimated Creatinine Clearance: 58.4 mL/min (A) (by C-G formula based on SCr of 1.51 mg/dL (H)).   Medical History: Past Medical History:  Diagnosis Date   Aortic stenosis    moderate in 2022   Atrial fibrillation (HCC)    CHF (congestive heart failure) (HCC)    Coronary artery disease    Diabetes mellitus without complication (HCC)    HLD (hyperlipidemia)    Hypertension    Peripheral arterial disease (HCC)     Medications:  PTA Warfarin 5mg  daily - LD 11.27 @ 1200  Assessment: 70 yr male with respiratory failure; + influenza A, possible pneumonia PMH significant for AFib and patient on warfarin PTA Warfarin to be resumed today without the need for IV heparin any further.   Today, 03/24/21  INR 2.7, therapeutic  LD on 11/27 at 1200  DDI with meds: azithromycin  Currently intubated  Will give a slightly lower dose today since np PO intake and azithromycin    Goal of Therapy:  Heparin level 0.3-0.7 units/ml Monitor platelets by anticoagulation protocol: Yes   Plan:  Warfarin 4 mg PO x1  Daily PT/INR Monitor CBC  Monitor for signs and symptoms of bleeding    Royetta Asal, PharmD, BCPS 03/24/2021 11:16 AM

## 2021-03-24 NOTE — Progress Notes (Signed)
ANTICOAGULATION CONSULT NOTE - Initial Consult  Pharmacy Consult for Heparin Indication: atrial fibrillation  (on warfarin PTA)  No Known Allergies  Patient Measurements: Height: 6' (182.9 cm) Weight: 107 kg (235 lb 14.3 oz) IBW/kg (Calculated) : 77.6 Heparin Dosing Weight: 100 kg  Vital Signs: Temp: 98.9 F (37.2 C) (11/28 0600) Temp Source: Core (11/28 0400) BP: 109/65 (11/28 0600) Pulse Rate: 52 (11/28 0600)  Labs: Recent Labs    03/24/21 0026 03/24/21 0350  HGB 8.9*  --   HCT 30.2*  --   PLT 324  --   LABPROT 28.3*  --   INR 2.7*  --   CREATININE 1.51*  --   TROPONINIHS 192* 1,274*    Estimated Creatinine Clearance: 58.4 mL/min (A) (by C-G formula based on SCr of 1.51 mg/dL (H)).   Medical History: Past Medical History:  Diagnosis Date   Aortic stenosis    moderate in 2022   Atrial fibrillation (HCC)    CHF (congestive heart failure) (HCC)    Coronary artery disease    Diabetes mellitus without complication (HCC)    HLD (hyperlipidemia)    Hypertension    Peripheral arterial disease (HCC)     Medications:  PTA Warfarin 5mg  daily - LD 11.27 @ 1200  Assessment: 70 yr male with respiratory failure; + influenza A, possible pneumonia PMH significant for AFib and patient on warfarin PTA Warfarin held on admission due to critical illness and possible need for procedures. Confirmed with Dr Duwayne Heck that IV heparin to begin when INR < 2  Goal of Therapy:  Heparin level 0.3-0.7 units/ml Monitor platelets by anticoagulation protocol: Yes   Plan:  Begin IV heparin when INR < 2 Daily PT/INR  Dellia Donnelly, Toribio Harbour, PharmD 03/24/2021,6:11 AM

## 2021-03-24 NOTE — ED Provider Notes (Addendum)
[facility] DEPT Provider Note   CSN: [id number] Arrival date & time: 03/24/21  0008     History Chief Complaint  Patient presents with   Shortness of Breath    Rodney [name] is a 70 y.o. male.  70 year old male who presents the emergency department today secondary to dyspnea.  He has multiple medical problems include A. fib, aortic stenosis, CHF, coronary disease, diabetes.  Sounds like his wife has some type of viral illness recently and then on Saturday morning patient woke up not feeling well.  He had some cough and shortness of breath throughout the day but then Sunday got worse.  No fevers at home that they know of.  But then just prior to arrival he had a significant worsening of his shortness of breath and as his wife called EMS.  Per EMS report his sats were in the 98s on arrival.  Started on a nonrebreather and brought here for further evaluation.  Patient does not complain of any chest pain at this time.  No nausea vomiting diarrhea or constipation.  No other associated symptoms.   Shortness of Breath     Past Medical History:  Diagnosis Date   Aortic stenosis    moderate in 2022   Atrial fibrillation (HCC)    CHF (congestive heart failure) ([place])    Coronary artery disease    Diabetes mellitus without complication ([place])    HLD (hyperlipidemia)    Hypertension    Peripheral arterial disease ([place])     Patient Active Problem List   Diagnosis Date Noted   Hyperkalemia 12/10/2020   COPD (chronic obstructive pulmonary disease) ([place]) 12/09/2020   Peripheral neuropathy 11/11/2020   Open wound of right great toe    Supratherapeutic INR 11/10/2020   History of DVT (deep vein thrombosis) 11/10/2020   Acute deep vein thrombosis (DVT) of iliac vein of right lower extremity (HCC)    Acute on chronic anemia    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   CAD (coronary artery disease) 02/15/2020   CKD stage 3  due to type 2 diabetes mellitus ([place]) 02/15/2020   Long term (current) use of anticoagulants 12/29/2019   S/P BKA (below knee amputation) unilateral, left ([place]) 11/14/2019   High risk social situation 11/14/2019   AF (paroxysmal atrial fibrillation) ([place]) 10/03/2019   HFrEF (heart failure with reduced ejection fraction) ([place]) 09/28/2019   Moderate aortic stenosis 09/28/2019   Anemia in chronic illness 09/27/2019   Protein calorie malnutrition (HCC)    PVD (peripheral vascular disease) ([place])    Umbilical hernia 28/31/5176   Obesity 10/09/2015   Tobacco abuse 09/07/2013   DM (diabetes mellitus), type 2 with neurological complications ([place]) 16/10/3708   Hypercholesteremia 10/28/2010   ERECTILE DYSFUNCTION 05/22/2009   Essential hypertension 01/23/2009    Past Surgical History:  Procedure Laterality Date   ABDOMINAL AORTOGRAM W/LOWER EXTREMITY N/A 08/05/2020   Procedure: ABDOMINAL AORTOGRAM W/LOWER EXTREMITY;  Surgeon: Marty Heck, MD;  Location: [facility];  Service: Cardiovascular;  Laterality: N/A;   ABDOMINAL AORTOGRAM W/LOWER EXTREMITY N/A 11/13/2020   Procedure: ABDOMINAL AORTOGRAM W/LOWER EXTREMITY;  Surgeon: Cherre Robins, MD;  Location: [facility];  Service: Cardiovascular;  Laterality: N/A;   AMPUTATION Left 09/28/2019   Procedure: AMPUTATION BELOW KNEE;  Surgeon: Newt Minion, MD;  Location: [facility];  Service: Orthopedics;  Laterality: Left;   AMPUTATION Right 11/15/2020   Procedure: RIGHT GREAT TOE AMPUTATION;  Surgeon:  Newt Minion, MD;  Location: [facility];  Service: Orthopedics;  Laterality: Right;   CARDIAC CATHETERIZATION     CARDIOVERSION N/A 10/05/2019   Procedure: CARDIOVERSION;  Surgeon: Sanda Klein, MD;  Location: [facility];  Service: Cardiovascular;  Laterality: N/A;   FEMORAL-POPLITEAL BYPASS GRAFT Right 08/07/2020   Procedure: RIGHT FEMORAL TO BELOW KNEE POPLITEAL ARTERY BYPASS;  Surgeon: Waynetta Sandy, MD;  Location: [facility];   Service: Vascular;  Laterality: Right;   LEFT HEART CATH AND CORONARY ANGIOGRAPHY N/A 10/03/2019   Procedure: LEFT HEART CATH AND CORONARY ANGIOGRAPHY;  Surgeon: Lorretta Harp, MD;  Location: [facility];  Service: Cardiovascular;  Laterality: N/A;   PERIPHERAL VASCULAR INTERVENTION Right 08/05/2020   Procedure: PERIPHERAL VASCULAR INTERVENTION;  Surgeon: Marty Heck, MD;  Location: [facility];  Service: Cardiovascular;  Laterality: Right;  common Iliac   PERIPHERAL VASCULAR INTERVENTION Left 11/13/2020   Procedure: PERIPHERAL VASCULAR INTERVENTION;  Surgeon: Cherre Robins, MD;  Location: [facility];  Service: Cardiovascular;  Laterality: Left;   PERIPHERAL VASCULAR INTERVENTION Right 11/14/2020   Procedure: PERIPHERAL VASCULAR INTERVENTION;  Surgeon: Marty Heck, MD;  Location: [facility];  Service: Cardiovascular;  Laterality: Right;  POP/SFA STENT   TEE WITHOUT CARDIOVERSION N/A 10/05/2019   Procedure: TRANSESOPHAGEAL ECHOCARDIOGRAM (TEE);  Surgeon: Sanda Klein, MD;  Location: [facility];  Service: Cardiovascular;  Laterality: N/A;       Family History  Problem Relation Age of Onset   Alcoholism Mother    Alcoholism Father     Social History   Tobacco Use   Smoking status: Every Day    Packs/day: 0.50    Years: 50.00    Pack years: 25.00    Types: Cigarettes   Smokeless tobacco: Never  Vaping Use   Vaping Use: Never used  Substance Use Topics   Alcohol use: Yes    Alcohol/week: 6.0 standard drinks    Types: 6 Standard drinks or equivalent per week    Comment: 11/07/20 - states he has not drank in 6 months   Drug use: No    Home Medications Prior to Admission medications   Medication Sig Start Date End Date Taking? Authorizing Provider  acetaminophen (TYLENOL) 325 MG tablet Take 1-2 tablets (325-650 mg total) by mouth every 6 (six) hours as needed for mild pain (pain score 1-3 or temp > 100.5). Patient taking  differently: Take 650 mg by mouth every 6 (six) hours as needed for mild pain (pain score 1-3 or temp > 100.5). 11/17/20  Yes Carollee Leitz, MD  albuterol (VENTOLIN HFA) 108 (90 Base) MCG/ACT inhaler INHALE 2 PUFFS INTO THE LUNGS EVERY 6 (SIX) HOURS AS NEEDED FOR WHEEZING OR SHORTNESS OF BREATH. 03/03/21  Yes McDiarmid, Blane Ohara, MD  amiodarone (PACERONE) 200 MG tablet Take 1 tablet (200 mg total) by mouth daily. 10/14/20  Yes Hensel, Jamal Collin, MD  atorvastatin (LIPITOR) 80 MG tablet TAKE 1 TABLET EVERY DAY Patient taking differently: Take 80 mg by mouth daily. 03/11/21  Yes O'Neal, Cassie Freer, MD  carvedilol (COREG) 12.5 MG tablet Take 6.25 mg by mouth 2 (two) times daily. 12/18/20  Yes [provider]  clopidogrel (PLAVIX) 75 MG tablet TAKE 1 TABLET EVERY DAY WITH BREAKFAST 03/17/21  Yes Hensel, Jamal Collin, MD  furosemide (LASIX) 40 MG tablet Take 1 tablet (40 mg total) by mouth daily as needed for fluid (swelling). 02/13/21  Yes Simmons-Robinson, Makiera, MD  gabapentin (NEURONTIN) 100 MG capsule  Take 2 capsules (200 mg total) by mouth 3 (three) times daily. Patient taking differently: Take 100 mg by mouth daily. 11/17/20  Yes Carollee Leitz, MD  insulin NPH-regular Human (NOVOLIN 70/30 RELION) (70-30) 100 UNIT/ML injection Inject 15-30 Units into the skin in the morning and at bedtime. Inject 30 units into the skin with breakfast and 15 units with supper   Yes [provider]  metFORMIN (GLUCOPHAGE) 1000 MG tablet Take 1 tablet (1,000 mg total) by mouth daily. 09/18/20  Yes Hensel, Jamal Collin, MD  Nutritional Supplements (FEEDING SUPPLEMENT, NEPRO CARB STEADY,) LIQD Take 237 mLs by mouth 2 (two) times daily between meals. 11/22/20  Yes Ezequiel Essex, MD  oxymetazoline (AFRIN) 0.05 % nasal spray Place 1 spray into both nostrils 2 (two) times daily as needed for congestion.   Yes [provider]  warfarin (COUMADIN) 5 MG tablet Take 7.24m (one and a half tablets) on Mondays,  Wednesdays and Fridays; Take 569m(one tablet) on Tuesdays, Thursdays, Saturdays and Sundays. OR as directed by Warfar[facility] Clinic Patient taking differently: Take 5 mg by mouth daily in the afternoon. 12/11/20  Yes Hensel, WiJamal CollinMD  Blood Glucose Monitoring Suppl (TRUE METRIX METER) DEVI Use to test blood sugar three times daily. 11/20/19   HeZenia ResidesMD  Blood Glucose Monitoring Suppl (TRUE METRIX METER) w/Device KIT USE AS DIRECTED 03/25/20   HeZenia ResidesMD  fluticasone (FLONASE) 50 MCG/ACT nasal spray Place 2 sprays into both nostrils daily. 02/13/21   Simmons-Robinson, Makiera, MD  glucose blood (RELION TRUE METRIX TEST STRIPS) test strip Use to test blood sugar three times per day. 11/20/19   HeZenia ResidesMD  Multiple Vitamin (MULTIVITAMIN WITH MINERALS) TABS tablet Take 1 tablet by mouth daily. Patient not taking: No sig reported 11/18/20   WaCarollee LeitzMD  nicotine (NICODERM CQ - DOSED IN MG/24 HOURS) 14 mg/24hr patch Place 1 patch (14 mg total) onto the skin daily. Patient not taking: Reported on 01/26/2021 12/09/20   HeZenia ResidesMD  spironolactone (ALDACTONE) 25 MG tablet Take 25 mg by mouth daily. 03/04/21   [provider]  TRUEplus Lancets 33G MISC Use to test blood sugar three times per day. 11/20/19   HeZenia ResidesMD  umeclidinium bromide (INCRUSE ELLIPTA) 62.5 MCG/INH AEPB Inhale 1 puff into the lungs daily. Patient not taking: Reported on 01/26/2021 12/09/20   HeZenia ResidesMD    Allergies    Patient has no known allergies.  Review of Systems   Review of Systems  Respiratory:  Positive for shortness of breath.   All other systems reviewed and are negative.  Physical Exam Updated Vital Signs BP 132/80 (BP Location: Right Arm)   Pulse 75   Temp (!) 102.2 F (39 C) (Rectal) Comment: RN, made aware of elevated rectal  temperature.  Resp (!) 28   Ht 6' (1.829 m)   Wt 107 kg   SpO2 100%   BMI 31.99 kg/m   Physical Exam Vitals  and nursing note reviewed.  Constitutional:      General: He is in acute distress.     Appearance: He is well-developed. He is ill-appearing and diaphoretic.  HENT:     Head: Normocephalic and atraumatic.     Nose: No congestion or rhinorrhea.     Mouth/Throat:     Mouth: Mucous membranes are dry.  Eyes:     Pupils: Pupils are equal, round, and reactive to light.  Cardiovascular:  Rate and Rhythm: Tachycardia present.  Pulmonary:     Effort: Tachypnea, accessory muscle usage and respiratory distress present.     Breath sounds: Decreased breath sounds, wheezing and rales present.  Chest:     Chest wall: No tenderness.  Abdominal:     General: There is no distension.     Palpations: Abdomen is soft.  Musculoskeletal:        General: Normal range of motion.     Cervical back: Normal range of motion.     Right lower leg: No tenderness. Edema present.     Comments: Left BKA  Skin:    General: Skin is warm.  Neurological:     General: No focal deficit present.     Mental Status: He is alert.    ED Results / Procedures / Treatments   Labs (all labs ordered are listed, but only abnormal results are displayed) Labs Reviewed  COMPREHENSIVE METABOLIC PANEL - Abnormal; Notable for the following components:      Result Value   Sodium 134 (*)    Potassium 5.7 (*)    Glucose, Bld 201 (*)    BUN 29 (*)    Creatinine, Ser 1.51 (*)    Calcium 8.4 (*)    AST 56 (*)    ALT 54 (*)    GFR, Estimated 50 (*)    All other components within normal limits  CBC WITH DIFFERENTIAL/PLATELET - Abnormal; Notable for the following components:   WBC 11.3 (*)    RBC 3.46 (*)    Hemoglobin 8.9 (*)    HCT 30.2 (*)    MCH 25.7 (*)    MCHC 29.5 (*)    Neutro Abs 9.6 (*)    Abs Immature Granulocytes 0.09 (*)    All other components within normal limits  PROTIME-INR - Abnormal; Notable for the following components:   Prothrombin Time 28.3 (*)    INR 2.7 (*)    All other components within  normal limits  BLOOD GAS, ARTERIAL - Abnormal; Notable for the following components:   pH, Arterial 7.244 (*)    pCO2 arterial 64.8 (*)    pO2, Arterial 165 (*)    All other components within normal limits  TROPONIN I (HIGH SENSITIVITY) - Abnormal; Notable for the following components:   Troponin I (High Sensitivity) 192 (*)    All other components within normal limits  CULTURE, BLOOD (ROUTINE X 2)  CULTURE, BLOOD (ROUTINE X 2)  RESP PANEL BY RT-PCR (FLU A&B, COVID) ARPGX2  LACTIC ACID, PLASMA  LACTIC ACID, PLASMA  URINALYSIS, ROUTINE W REFLEX MICROSCOPIC  BRAIN NATRIURETIC PEPTIDE  BLOOD GAS, ARTERIAL  TROPONIN I (HIGH SENSITIVITY)    EKG None  Radiology DG Chest Portable 1 View  Result Date: 03/24/2021 CLINICAL DATA:  Respiratory distress. EXAM: PORTABLE CHEST 1 VIEW COMPARISON:  January 26, 2021 FINDINGS: Moderate severity areas of atelectasis and/or infiltrate are seen within the bilateral lung bases, left greater than right. This represents a new finding when compared to the prior study. Mildly increased interstitial lung markings are also noted. A small left pleural effusion is seen. This is also new when compared to the prior exam. No pneumothorax is identified. The heart size and mediastinal contours are within normal limits. There is moderate severity calcification of the aortic arch. The visualized skeletal structures are unremarkable. IMPRESSION: 1. Moderate severity bibasilar atelectasis and/or infiltrate, left greater than right. A mild, superimposed component of congestive heart failure cannot be excluded. 2. Small left  pleural effusion. Electronically Signed   By: Virgina Norfolk M.D.   On: 03/24/2021 00:39    Procedures .Critical Care Performed by: Merrily Pew, MD Authorized by: Merrily Pew, MD   Critical care provider statement:    Critical care time (minutes):  75   Critical care time was exclusive of:  Separately billable procedures and treating other  patients and teaching time   Critical care was necessary to treat or prevent imminent or life-threatening deterioration of the following conditions:  Sepsis, respiratory failure, cardiac failure, CNS failure or compromise, dehydration and metabolic crisis   Critical care was time spent personally by me on the following activities:  Development of treatment plan with patient or surrogate, evaluation of patient's response to treatment, examination of patient, obtaining history from patient or surrogate, review of old charts, re-evaluation of patient's condition, pulse oximetry, ordering and performing treatments and interventions and ordering and review of laboratory studies Procedure Name: Intubation Date/Time: 03/24/2021 7:56 AM Performed by: Merrily Pew, MD Pre-anesthesia Checklist: Patient identified, Patient being monitored, Emergency Drugs available, Timeout performed and Suction available Oxygen Delivery Method: Non-rebreather mask Preoxygenation: Pre-oxygenation with 100% oxygen Induction Type: Rapid sequence Ventilation: Mask ventilation without difficulty Laryngoscope Size: Glidescope and 3 Grade View: Grade II Tube size: 7.5 mm Number of attempts: 1 Placement Confirmation: ETT inserted through vocal cords under direct vision, CO2 detector and Breath sounds checked- equal and bilateral Secured at: 24 cm Tube secured with: ETT holder Dental Injury: Teeth and Oropharynx as per pre-operative assessment  Future Recommendations: Recommend- induction with short-acting agent, and alternative techniques readily available      Medications Ordered in ED Medications  azithromycin (ZITHROMAX) 500 mg in sodium chloride 0.9 % 250 mL IVPB (500 mg Intravenous New Bag/Given 03/24/21 0122)  lactated ringers infusion ( Intravenous New Bag/Given 03/24/21 0232)  midazolam (VERSED) 2 MG/2ML injection (has no administration in time range)  propofol (DIPRIVAN) 1000 MG/100ML infusion (0 mcg/kg/min   107 kg Intravenous Stopped 03/24/21 0245)  acetaminophen (TYLENOL) suppository 650 mg (has no administration in time range)  fentaNYL 2547mg in NS 2589m(1024mml) infusion-PREMIX (has no administration in time range)  fentaNYL (SUBLIMAZE) bolus via infusion 25-100 mcg (has no administration in time range)  etomidate (AMIDATE) injection ( Intravenous Canceled Entry 03/24/21 0230)  rocuronium (ZEMURON) injection ( Intravenous Canceled Entry 03/24/21 0230)  cefTRIAXone (ROCEPHIN) 2 g in sodium chloride 0.9 % 100 mL IVPB (0 g Intravenous Stopped 03/24/21 0204)  albuterol (PROVENTIL) (2.5 MG/3ML) 0.083% nebulizer solution 5 mg (5 mg Nebulization Given 03/24/21 0100)  LORazepam (ATIVAN) injection 1 mg (1 mg Intravenous Given 03/24/21 0117)  fentaNYL (SUBLIMAZE) injection 25 mcg (25 mcg Intravenous Given 03/24/21 0256)    ED Course  I have reviewed the triage vital signs and the nursing notes.  Pertinent labs & imaging results that were available during my care of the patient were reviewed by me and considered in my medical decision making (see chart for details).    MDM Rules/Calculators/A&P                         Initial arrival patient was in obvious respiratory distress.  With his history was unclear whether this was from of viral pneumonitis, bacterial pneumonia, CHF exacerbation and COPD exacerbation or some combination of those.  His x-ray did look like possible bacterial pneumonia so antibiotics started.  Patient was started on BiPAP his blood pressure was high his heart rate was high which would have  been consistent with fluid overload so we held on any other fluids.  Patient was persistently tachypneic in the 40s and 50s for over an hour and a half on the BiPAP.  His mental status was decreasing concern for his airway and his ability to maintain his ventilation status and tiring out so patient was intubated.  At this time I feel like he most likely has bacterial pneumonia complicated by  some fluid overload and pulmonary edema.  On appropriate treatments for the pneumonia at this time.  Patient's troponin elevated and trending up.  I suspect this is more demand ischemia from his hypoxia, hypotension.  We will continue to trend.  Patient's blood pressures are soft.  Initially responded to fluids however had multiple liters of fluids in the states soft so we will start a low-dose of Levophed at this time.  Critical care to admit.  Final Clinical Impression(s) / ED Diagnoses Final diagnoses:  NSTEMI (non-ST elevated myocardial infarction) ([place])  Acute respiratory failure with hypoxia ([place])  Influenza with pneumonia  Sepsis, due to unspecified organism, unspecified whether acute organ dysfunction present [facility])    Rx / DC Orders ED Discharge Orders     None        Jahnessa Vanduyn, Corene Cornea, MD 03/24/21 [id number]    Merrily Pew, MD 03/24/21 [phone]

## 2021-03-24 NOTE — H&P (Signed)
NAME:  Rodney [name], MRN:  [id number], DOB:  June 25, 1950, LOS: 0 ADMISSION DATE:  03/24/2021, CONSULTATION DATE:  03/24/21 REFERRING MD:  Mesner, CHIEF COMPLAINT:  short of breath   History of Present Illness:  70 yo man with hx of here with SOB today.  EMS found sat to be 58%, improved to 98% -100% on 15L nrb.  Started solumedrol 133m and albuterol.  Trial of bipap in ED, but patient remained very tachypneic, so ultimately was intubated.   Cellulitic appearing RLL per EMS.   Wife was recently sick with apparent URI recently, then patient developed URI sx inc cough and SOB on 11/26 am.  Worsened over the course of the weekend.    Febrile in ED Started on CTX, azithro  Resp acidosis  Hyperkalemia 5.7  Cr 1.5 BL 1.15 - 1.94 Trop 192, BNP 500 WBC 11.3 INR 2.7 on coumadin  Influenza A positive  Pertinent  Medical History  Afib Aortic stenosis HFpEF  CAD DM2 PAD HTN HLD COPD DVT hx  CKD 3   Significant Hospital Events: Including procedures, antibiotic start and stop dates in addition to other pertinent events     Interim History / Subjective:    Objective   Blood pressure (!) 80/58, pulse 62, temperature (!) 102.2 F (39 C), temperature source Rectal, resp. rate (!) 27, height 6' (1.829 m), weight 107 kg, SpO2 99 %.    Vent Mode: PRVC FiO2 (%):  [50 %-100 %] 100 % Set Rate:  [28 bmp] 28 bmp Vt Set:  [500 mL] 500 mL PEEP:  [5 cmH20] 5MulfordPressure:  [26 cmH20] 26 cmH20  No intake or output data in the 24 hours ending 03/24/21 0345 Filed Weights   03/24/21 0241  Weight: 107 kg    Examination: General: NAD intubated and sedated  HENT: NCAT  Lungs: CTAB Cardiovascular: RRR no mgr, mild bradycardia( 50s) Abdomen: nt, nd, nbs Extremities: BKA, RLE with some non pitting edema, Great toe amputation, healing lesion at base  Neuro: sedated non responsive  GU:   Resolved Hospital Problem list     Assessment & Plan:  Hypoxemic respiratory  failure: found to have influenza a.   Cont low tidal volume ventilation as tolerates.  Oxygenating well on 45% now.   ABG -improving but persistant resp acidosis.   Tamiflu, cont ctx and azithro  possible CAP  Hypotension: has received fluids.  Starting levophed now.   CKD: he is around his baseline cr.   NSTEMI: demand ischemia related, trop initially 192  Afib: will do heparin infusion instead of coumadin now given critical illness and possible need for procedures.   Hyperkalemia:  Lokelma, monitor.   DM: ISS hold oral meds Aortic stenosis HFpEF  CAD HTN HLD COPD DVT hx   Best Practice (right click and "Reselect all SmartList Selections" daily)   Diet/type: NPO w/ oral meds DVT prophylaxis: LMWH GI prophylaxis: PPI Lines: N/A Foley:  N/A Code Status:  full code Last date of multidisciplinary goals of care discussion _0   Labs   CBC: Recent Labs  Lab 03/24/21 0026  WBC 11.3*  NEUTROABS 9.6*  HGB 8.9*  HCT 30.2*  MCV 87.3  PLT 3937   Basic Metabolic Panel: Recent Labs  Lab 03/24/21 0026  NA 134*  K 5.7*  CL 99  CO2 27  GLUCOSE 201*  BUN 29*  CREATININE 1.51*  CALCIUM 8.4*   GFR: Estimated Creatinine Clearance: 58.4 mL/min (A) (by C-G formula based  on SCr of 1.51 mg/dL (H)). Recent Labs  Lab 03/24/21 0026  WBC 11.3*  LATICACIDVEN 1.7    Liver Function Tests: Recent Labs  Lab 03/24/21 0026  AST 56*  ALT 54*  ALKPHOS 81  BILITOT 0.9  PROT 7.8  ALBUMIN 3.8   No results for input(s): LIPASE, AMYLASE in the last 168 hours. No results for input(s): AMMONIA in the last 168 hours.  ABG    Component Value Date/Time   PHART 7.244 (L) 03/24/2021 0046   PCO2ART 64.8 (H) 03/24/2021 0046   PO2ART 165 (H) 03/24/2021 0046   HCO3 26.6 03/24/2021 0046   TCO2 26 01/26/2021 0615   ACIDBASEDEF 0.6 03/24/2021 0046   O2SAT 99.2 03/24/2021 0046     Coagulation Profile: Recent Labs  Lab 03/24/21 0026  INR 2.7*    Cardiac Enzymes: No  results for input(s): CKTOTAL, CKMB, CKMBINDEX, TROPONINI in the last 168 hours.  HbA1C: Hemoglobin A1C  Date/Time Value Ref Range Status  07/03/2020 03:58 PM 6.5 (A) 4.0 - 5.6 % Final   HbA1c, POC (controlled diabetic range)  Date/Time Value Ref Range Status  10/14/2020 11:30 AM 6.1 0.0 - 7.0 % Final  09/21/2019 02:51 PM 8.7 (A) 0.0 - 7.0 % Final   Hgb A1c MFr Bld  Date/Time Value Ref Range Status  04/03/2020 06:30 PM 7.5 (H) 4.8 - 5.6 % Final    Comment:    (NOTE) Pre diabetes:          5.7%-6.4%  Diabetes:              >6.4%  Glycemic control for   <7.0% adults with diabetes   09/26/2019 09:34 PM 9.2 (H) 4.8 - 5.6 % Final    Comment:    (NOTE) Pre diabetes:          5.7%-6.4% Diabetes:              >6.4% Glycemic control for   <7.0% adults with diabetes     CBG: No results for input(s): GLUCAP in the last 168 hours.  Review of Systems:   Unable to assess   Past Medical History:  He,  has a past medical history of Aortic stenosis, Atrial fibrillation ([place]), CHF (congestive heart failure) ([place]), Coronary artery disease, Diabetes mellitus without complication ([place]), HLD (hyperlipidemia), Hypertension, and Peripheral arterial disease ([place]).   Surgical History:   Past Surgical History:  Procedure Laterality Date   ABDOMINAL AORTOGRAM W/LOWER EXTREMITY N/A 08/05/2020   Procedure: ABDOMINAL AORTOGRAM W/LOWER EXTREMITY;  Surgeon: Marty Heck, MD;  Location: [facility];  Service: Cardiovascular;  Laterality: N/A;   ABDOMINAL AORTOGRAM W/LOWER EXTREMITY N/A 11/13/2020   Procedure: ABDOMINAL AORTOGRAM W/LOWER EXTREMITY;  Surgeon: Cherre Robins, MD;  Location: [place] [facility];  Service: Cardiovascular;  Laterality: N/A;   AMPUTATION Left 09/28/2019   Procedure: AMPUTATION BELOW KNEE;  Surgeon: Newt Minion, MD;  Location: [place];  Service: Orthopedics;  Laterality: Left;   AMPUTATION Right 11/15/2020   Procedure: RIGHT GREAT TOE AMPUTATION;  Surgeon:  Newt Minion, MD;  Location: [place];  Service: Orthopedics;  Laterality: Right;   CARDIAC CATHETERIZATION     CARDIOVERSION N/A 10/05/2019   Procedure: CARDIOVERSION;  Surgeon: Sanda Klein, MD;  Location: [facility];  Service: Cardiovascular;  Laterality: N/A;   FEMORAL-POPLITEAL BYPASS GRAFT Right 08/07/2020   Procedure: RIGHT FEMORAL TO BELOW KNEE POPLITEAL ARTERY BYPASS;  Surgeon: Waynetta Sandy, MD;  Location: [place];  Service: Vascular;  Laterality:  Right;   LEFT HEART CATH AND CORONARY ANGIOGRAPHY N/A 10/03/2019   Procedure: LEFT HEART CATH AND CORONARY ANGIOGRAPHY;  Surgeon: Lorretta Harp, MD;  Location: [place] [facility];  Service: Cardiovascular;  Laterality: N/A;   PERIPHERAL VASCULAR INTERVENTION Right 08/05/2020   Procedure: PERIPHERAL VASCULAR INTERVENTION;  Surgeon: Marty Heck, MD;  Location: [place];  Service: Cardiovascular;  Laterality: Right;  common Iliac   PERIPHERAL VASCULAR INTERVENTION Left 11/13/2020   Procedure: PERIPHERAL VASCULAR INTERVENTION;  Surgeon: Cherre Robins, MD;  Location: [place] [facility];  Service: Cardiovascular;  Laterality: Left;   PERIPHERAL VASCULAR INTERVENTION Right 11/14/2020   Procedure: PERIPHERAL VASCULAR INTERVENTION;  Surgeon: Marty Heck, MD;  Location: [place] [facility];  Service: Cardiovascular;  Laterality: Right;  POP/SFA STENT   TEE WITHOUT CARDIOVERSION N/A 10/05/2019   Procedure: TRANSESOPHAGEAL ECHOCARDIOGRAM (TEE);  Surgeon: Sanda Klein, MD;  Location: [facility];  Service: Cardiovascular;  Laterality: N/A;     Social History:   reports that he has been smoking cigarettes. He has a 25.00 pack-year smoking history. He has never used smokeless tobacco. He reports current alcohol use of about 6.0 standard drinks per week. He reports that he does not use drugs.   Family History:  His family history includes Alcoholism in his father and mother.   Allergies No Known Allergies    Home Medications  Prior to Admission medications   Medication Sig Start Date End Date Taking? Authorizing Provider  acetaminophen (TYLENOL) 325 MG tablet Take 1-2 tablets (325-650 mg total) by mouth every 6 (six) hours as needed for mild pain (pain score 1-3 or temp > 100.5). Patient taking differently: Take 650 mg by mouth every 6 (six) hours as needed for mild pain (pain score 1-3 or temp > 100.5). 11/17/20  Yes Carollee Leitz, MD  albuterol (VENTOLIN HFA) 108 (90 Base) MCG/ACT inhaler INHALE 2 PUFFS INTO THE LUNGS EVERY 6 (SIX) HOURS AS NEEDED FOR WHEEZING OR SHORTNESS OF BREATH. 03/03/21  Yes McDiarmid, Blane Ohara, MD  amiodarone (PACERONE) 200 MG tablet Take 1 tablet (200 mg total) by mouth daily. 10/14/20  Yes Hensel, Jamal Collin, MD  atorvastatin (LIPITOR) 80 MG tablet TAKE 1 TABLET EVERY DAY Patient taking differently: Take 80 mg by mouth daily. 03/11/21  Yes O'Neal, Cassie Freer, MD  carvedilol (COREG) 12.5 MG tablet Take 6.25 mg by mouth 2 (two) times daily. 12/18/20  Yes [provider]  clopidogrel (PLAVIX) 75 MG tablet TAKE 1 TABLET EVERY DAY WITH BREAKFAST 03/17/21  Yes Hensel, Jamal Collin, MD  furosemide (LASIX) 40 MG tablet Take 1 tablet (40 mg total) by mouth daily as needed for fluid (swelling). 02/13/21  Yes Simmons-Robinson, Makiera, MD  gabapentin (NEURONTIN) 100 MG capsule Take 2 capsules (200 mg total) by mouth 3 (three) times daily. Patient taking differently: Take 100 mg by mouth daily. 11/17/20  Yes Carollee Leitz, MD  insulin NPH-regular Human (NOVOLIN 70/30 RELION) (70-30) 100 UNIT/ML injection Inject 15-30 Units into the skin in the morning and at bedtime. Inject 30 units into the skin with breakfast and 15 units with supper   Yes [provider]  metFORMIN (GLUCOPHAGE) 1000 MG tablet Take 1 tablet (1,000 mg total) by mouth daily. 09/18/20  Yes Hensel, Jamal Collin, MD  Nutritional Supplements (FEEDING SUPPLEMENT, NEPRO CARB STEADY,) LIQD Take 237 mLs by mouth 2 (two)  times daily between meals. 11/22/20  Yes Ezequiel Essex, MD  oxymetazoline (AFRIN) 0.05 % nasal spray Place 1 spray into  both nostrils 2 (two) times daily as needed for congestion.   Yes [provider]  warfarin (COUMADIN) 5 MG tablet Take 7.32m (one and a half tablets) on Mondays, Wednesdays and Fridays; Take 511m(one tablet) on Tuesdays, Thursdays, Saturdays and Sundays. OR as directed by Warfarin Clinic Patient taking differently: Take 5 mg by mouth daily in the afternoon. 12/11/20  Yes Hensel, WiJamal CollinMD  Blood Glucose Monitoring Suppl (TRUE METRIX METER) DEVI Use to test blood sugar three times daily. 11/20/19   HeZenia ResidesMD  Blood Glucose Monitoring Suppl (TRUE METRIX METER) w/Device KIT USE AS DIRECTED 03/25/20   HeZenia ResidesMD  fluticasone (FLONASE) 50 MCG/ACT nasal spray Place 2 sprays into both nostrils daily. 02/13/21   Simmons-Robinson, Makiera, MD  glucose blood (RELION TRUE METRIX TEST STRIPS) test strip Use to test blood sugar three times per day. 11/20/19   HeZenia ResidesMD  Multiple Vitamin (MULTIVITAMIN WITH MINERALS) TABS tablet Take 1 tablet by mouth daily. Patient not taking: No sig reported 11/18/20   WaCarollee LeitzMD  nicotine (NICODERM CQ - DOSED IN MG/24 HOURS) 14 mg/24hr patch Place 1 patch (14 mg total) onto the skin daily. Patient not taking: Reported on 01/26/2021 12/09/20   HeZenia ResidesMD  spironolactone (ALDACTONE) 25 MG tablet Take 25 mg by mouth daily. 03/04/21   [provider]  TRUEplus Lancets 33G MISC Use to test blood sugar three times per day. 11/20/19   HeZenia ResidesMD  umeclidinium bromide (INCRUSE ELLIPTA) 62.5 MCG/INH AEPB Inhale 1 puff into the lungs daily. Patient not taking: Reported on 01/26/2021 12/09/20   HeZenia ResidesMD     Critical care time: 60 min

## 2021-03-24 NOTE — ED Notes (Signed)
Date and time results received: 03/24/21 0220  Test: troponin Critical Value: 192  Name of Provider Notified: Mesner  Orders Received? Or Actions Taken?: n/a

## 2021-03-24 NOTE — ED Notes (Signed)
Pt wife is at the bedside. Pt is resting

## 2021-03-24 NOTE — ED Notes (Addendum)
Patient noted to still be hypotensive despite x2 bolus of fluids. MD at bedside. Verbal for 3rd liter of fluid. If after 700 cc patient's BP is not up, per Mesner, MD, can start levophed

## 2021-03-24 NOTE — Progress Notes (Signed)
PHARMACY NOTE:  ANTIMICROBIAL RENAL DOSAGE ADJUSTMENT  Current antimicrobial regimen includes a mismatch between antimicrobial dosage and estimated renal function.  As per policy approved by the Pharmacy & Therapeutics and Medical Executive Committees, the antimicrobial dosage will be adjusted accordingly.  Current antimicrobial dosage:  Tamiflu 75mg  po BID  Indication: Influenza A +  Renal Function:  Estimated Creatinine Clearance: 58.4 mL/min (A) (by C-G formula based on SCr of 1.51 mg/dL (H)).  Antimicrobial dosage has been changed to:  Tamiflu 30mg  po BID x 5 days (dose adjusted for CrCl 30-60 ml/min)  Thank you for allowing pharmacy to be a part of this patient's care.  Everette Rank, [facility] 03/24/2021 4:42 AM

## 2021-03-24 NOTE — ED Notes (Signed)
Respiratory called to advance ETT x2 cm

## 2021-03-24 NOTE — Progress Notes (Signed)
Seen and examined; labs/imaging reviewed. 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

## 2021-03-24 NOTE — ED Notes (Signed)
Date and time results received: 03/24/21 0445 Test: Lactic Critical Value: 2.0  Name of Provider Notified: Mesner  Orders Received? Or Actions Taken?: n/a

## 2021-03-24 NOTE — Sepsis Progress Note (Signed)
Blood cultures not showing as in process in Epic but appear to have been drawn at 0053.  Verified with bedside RN via secure chat that blood cultures have been drawn but not sent to lab yet.  RN to send to lab.

## 2021-03-24 NOTE — Sepsis Progress Note (Signed)
Monitoring for the code sepsis protocol. °

## 2021-03-24 NOTE — Progress Notes (Signed)
RT transported patient from the ED to ICU without any complications

## 2021-03-25 ENCOUNTER — Other Ambulatory Visit: Payer: Self-pay

## 2021-03-25 DIAGNOSIS — J101 Influenza due to other identified influenza virus with other respiratory manifestations: Secondary | ICD-10-CM | POA: Diagnosis not present

## 2021-03-25 LAB — PROTIME-INR
INR: 3 — ABNORMAL HIGH (ref 0.8–1.2)
Prothrombin Time: 31 seconds — ABNORMAL HIGH (ref 11.4–15.2)

## 2021-03-25 LAB — BASIC METABOLIC PANEL
Anion gap: 12 (ref 5–15)
BUN: 50 mg/dL — ABNORMAL HIGH (ref 8–23)
CO2: 21 mmol/L — ABNORMAL LOW (ref 22–32)
Calcium: 8.5 mg/dL — ABNORMAL LOW (ref 8.9–10.3)
Chloride: 104 mmol/L (ref 98–111)
Creatinine, Ser: 2.27 mg/dL — ABNORMAL HIGH (ref 0.61–1.24)
GFR, Estimated: 30 mL/min — ABNORMAL LOW (ref >60)
Glucose, Bld: 147 mg/dL — ABNORMAL HIGH (ref 70–99)
Potassium: 4.3 mmol/L (ref 3.5–5.1)
Sodium: 137 mmol/L (ref 135–145)

## 2021-03-25 LAB — GLUCOSE, CAPILLARY
Glucose-Capillary: 123 mg/dL — ABNORMAL HIGH (ref 70–99)
Glucose-Capillary: 130 mg/dL — ABNORMAL HIGH (ref 70–99)
Glucose-Capillary: 138 mg/dL — ABNORMAL HIGH (ref 70–99)
Glucose-Capillary: 145 mg/dL — ABNORMAL HIGH (ref 70–99)
Glucose-Capillary: 148 mg/dL — ABNORMAL HIGH (ref 70–99)
Glucose-Capillary: 213 mg/dL — ABNORMAL HIGH (ref 70–99)
Glucose-Capillary: 230 mg/dL — ABNORMAL HIGH (ref 70–99)

## 2021-03-25 LAB — MAGNESIUM: Magnesium: 1.8 mg/dL (ref 1.7–2.4)

## 2021-03-25 LAB — CBC
HCT: 26.3 % — ABNORMAL LOW (ref 39.0–52.0)
Hemoglobin: 7.8 g/dL — ABNORMAL LOW (ref 13.0–17.0)
MCH: 25.4 pg — ABNORMAL LOW (ref 26.0–34.0)
MCHC: 29.7 g/dL — ABNORMAL LOW (ref 30.0–36.0)
MCV: 85.7 fL (ref 80.0–100.0)
Platelets: 241 10*3/uL (ref 150–400)
RBC: 3.07 MIL/uL — ABNORMAL LOW (ref 4.22–5.81)
RDW: 15.3 % (ref 11.5–15.5)
WBC: 12.8 10*3/uL — ABNORMAL HIGH (ref 4.0–10.5)
nRBC: 0 % (ref 0.0–0.2)

## 2021-03-25 LAB — HEMOGLOBIN A1C
Hgb A1c MFr Bld: 6.9 % — ABNORMAL HIGH (ref 4.8–5.6)
Mean Plasma Glucose: 151 mg/dL

## 2021-03-25 LAB — PHOSPHORUS: Phosphorus: 4.1 mg/dL (ref 2.5–4.6)

## 2021-03-25 LAB — TRIGLYCERIDES: Triglycerides: 62 mg/dL (ref <150)

## 2021-03-25 LAB — PROCALCITONIN: Procalcitonin: 2.52 ng/mL

## 2021-03-25 MED ORDER — LABETALOL HCL 5 MG/ML IV SOLN
10.0000 mg | INTRAVENOUS | Status: DC | PRN
  Administered 2021-03-25: 10 mg via INTRAVENOUS
  Filled 2021-03-25: qty 4

## 2021-03-25 MED ORDER — WARFARIN 0.5 MG HALF TABLET
0.5000 mg | ORAL_TABLET | Freq: Once | ORAL | Status: AC
  Administered 2021-03-25: 0.5 mg via ORAL
  Filled 2021-03-25: qty 1

## 2021-03-25 MED ORDER — OSELTAMIVIR PHOSPHATE 6 MG/ML PO SUSR
30.0000 mg | Freq: Two times a day (BID) | ORAL | Status: DC
  Administered 2021-03-25: 30 mg via ORAL
  Filled 2021-03-25: qty 12.5

## 2021-03-25 MED ORDER — OSELTAMIVIR PHOSPHATE 30 MG PO CAPS
30.0000 mg | ORAL_CAPSULE | Freq: Two times a day (BID) | ORAL | Status: AC
  Administered 2021-03-25 – 2021-03-27 (×5): 30 mg via ORAL
  Filled 2021-03-25 (×5): qty 1

## 2021-03-25 MED ORDER — ORAL CARE MOUTH RINSE
15.0000 mL | Freq: Two times a day (BID) | OROMUCOSAL | Status: DC
  Administered 2021-03-25 – 2021-03-28 (×5): 15 mL via OROMUCOSAL

## 2021-03-25 MED ORDER — FUROSEMIDE 10 MG/ML IJ SOLN: 40.0000 mg | Freq: Four times a day (QID) | INTRAMUSCULAR | Status: DC

## 2021-03-25 NOTE — Progress Notes (Signed)
PT Cancellation Note  Patient Details Name: Rodney [name] MRN: [id number] DOB: 06-May-1950   Cancelled Treatment:    Reason Eval/Treat Not Completed: Patient declined, no reason specified Pt eating and declined PT.  Offered to come back after dinner but states he wants to sleep. Educated on importance of mobility with the flu - pt agrees to tomorrow.  Pt does not have his prosthetic and states family can't bring b/c wife also sick.  He did say he has not ambulated lately - only scooting to w/c. Rodney [name], PT Acute Rehab Services Pager [phone] [facility] [phone]   Rodney [name] 03/25/2021, 4:51 PM

## 2021-03-25 NOTE — Progress Notes (Signed)
ANTICOAGULATION CONSULT NOTE   Pharmacy Consult for warfarin Indication: atrial fibrillation     No Known Allergies  Patient Measurements: Height: 6' (182.9 cm) Weight: 105.9 kg (233 lb 7.5 oz) IBW/kg (Calculated) : 77.6 Heparin Dosing Weight: 100 kg  Vital Signs: Temp: 98.4 F (36.9 C) (11/29 0730) BP: 150/80 (11/29 0639) Pulse Rate: 65 (11/29 0730)  Labs: Recent Labs    03/24/21 0026 03/24/21 0350 03/25/21 0238  HGB 8.9*  --  7.8*  HCT 30.2*  --  26.3*  PLT 324  --  241  LABPROT 28.3*  --  31.0*  INR 2.7*  --  3.0*  CREATININE 1.51*  --  2.27*  TROPONINIHS 192* 1,274*  --     Estimated Creatinine Clearance: 38.6 mL/min (A) (by C-G formula based on SCr of 2.27 mg/dL (H)).   Medical History: Past Medical History:  Diagnosis Date   Aortic stenosis    moderate in 2022   Atrial fibrillation ([facility])    CHF (congestive heart failure) ([place])    Coronary artery disease    Diabetes mellitus without complication ([facility])    HLD (hyperlipidemia)    Hypertension    Peripheral arterial disease ([facility])     Medications:  PTA Warfarin 5mg  daily - LD 11/27 @ 1200  Assessment: 70 yr male with respiratory failure; + influenza A, possible pneumonia PMH significant for AFib and patient on warfarin PTA Warfarin to be resumed today without the need for IV heparin any further.   Today, 03/25/21  INR 3.0, high end of therapeutic range, will give small dose Warfarin DDI with meds: azithromycin  Extubated, passed swallow study  Goal of Therapy:  Heparin level 0.3-0.7 units/ml Monitor platelets by anticoagulation protocol: Yes   Plan:  Warfarin 0.5 mg PO x1  Daily PT/INR Monitor CBC  Monitor for signs and symptoms of bleeding  Minda Ditto PharmD [facility] [phone] 03/25/2021, 9:32 AM

## 2021-03-25 NOTE — Progress Notes (Signed)
NAME:  Rodney [name], MRN:  [id number], DOB:  [birth year], LOS: 1 ADMISSION DATE:  03/24/2021, CONSULTATION DATE:  03/24/21 REFERRING MD:  Mesner, CHIEF COMPLAINT:  short of breath   History of Present Illness:  70 yo man with hx of here with SOB today.  EMS found sat to be 58%, improved to 98% -100% on 15L nrb.  Started solumedrol 147m and albuterol.  Trial of bipap in ED, but patient remained very tachypneic, so ultimately was intubated.   Cellulitic appearing RLL per EMS.   Wife was recently sick with apparent URI recently, then patient developed URI sx inc cough and SOB on 11/26 am.  Worsened over the course of the weekend.    Febrile in ED Started on CTX, azithro  Resp acidosis  Hyperkalemia 5.7  Cr 1.5 BL 1.15 - 1.94 Trop 192, BNP 500 WBC 11.3 INR 2.7 on coumadin  Influenza A positive  Pertinent  Medical History  Afib Aortic stenosis HFpEF  CAD DM2 PAD HTN HLD COPD DVT hx  CKD 3   Significant Hospital Events: Including procedures, antibiotic start and stop dates in addition to other pertinent events   11/29: extubated  Interim History / Subjective:  No significant overnight events.  Objective   Blood pressure (!) 150/80, pulse 67, temperature 98.4 F (36.9 C), resp. rate (!) 28, height 6' (1.829 m), weight 105.9 kg, SpO2 100 %.    Vent Mode: PSV;CPAP FiO2 (%):  [40 %] 40 % Set Rate:  [28 bmp] 28 bmp Vt Set:  [620 mL] 620 mL PEEP:  [5 cmH20] 5 cmH20 Pressure Support:  [5 cmH20] 5 cmH20 Plateau Pressure:  [22 cmH20-26 cmH20] 22 cmH20   Intake/Output Summary (Last 24 hours) at 03/25/2021 0809 Last data filed at 03/25/2021 0715 Gross per 24 hour  Intake 1805.92 ml  Output 2485 ml  Net -679.08 ml   Filed Weights   03/24/21 0241 03/25/21 0500  Weight: 107 kg 105.9 kg    Examination: General: well appearing no distress Cardiac: RRR,  +1LE edema Pulm: intubated, breathing comfortably on pressure support, lungs clear GI: soft, nontender Neuro:  awake and alert  Resolved Hospital Problem list     Assessment & Plan:  Hypoxemic respiratory failure: found to have influenza a.   Doing well this morning. Will plan to extubate. Pulm hygeine.  Tamiflu, cont ctx and azithro  possible CAP  AKI on CKD -monitor  Chronic atrial fibrillation, severe AS -continue warfarin -outpatient follow up with cardiology  DM:  continue SSI  HFpEF. Diurese as renal function allows  CAD HTN HLD COPD DVT hx   Best Practice (right click and "Reselect all SmartList Selections" daily)   Diet/type: NPO w/ oral meds DVT prophylaxis: LMWH GI prophylaxis: PPI Lines: N/A Foley:  N/A Code Status:  full code Last date of multidisciplinary goals of care discussion _0   Labs   CBC: Recent Labs  Lab 03/24/21 0026 03/25/21 0238  WBC 11.3* 12.8*  NEUTROABS 9.6*  --   HGB 8.9* 7.8*  HCT 30.2* 26.3*  MCV 87.3 85.7  PLT 324 241     Basic Metabolic Panel: Recent Labs  Lab 03/24/21 0026 03/25/21 0238  NA 134* 137  K 5.7* 4.3  CL 99 104  CO2 27 21*  GLUCOSE 201* 147*  BUN 29* 50*  CREATININE 1.51* 2.27*  CALCIUM 8.4* 8.5*  MG  --  1.8  PHOS  --  4.1    GFR: Estimated Creatinine Clearance: 38.6 mL/min (A) (  by C-G formula based on SCr of 2.27 mg/dL (H)). Recent Labs  Lab 03/24/21 0026 03/24/21 0350 03/25/21 0238  PROCALCITON  --  0.74 2.52  WBC 11.3*  --  12.8*  LATICACIDVEN 1.7 2.0*  --      Liver Function Tests: Recent Labs  Lab 03/24/21 0026  AST 56*  ALT 54*  ALKPHOS 81  BILITOT 0.9  PROT 7.8  ALBUMIN 3.8    No results for input(s): LIPASE, AMYLASE in the last 168 hours. No results for input(s): AMMONIA in the last 168 hours.  ABG    Component Value Date/Time   PHART 7.297 (L) 03/24/2021 0328   PCO2ART 50.0 (H) 03/24/2021 0328   PO2ART 370 (H) 03/24/2021 0328   HCO3 23.0 03/24/2021 0328   TCO2 26 01/26/2021 0615   ACIDBASEDEF 2.3 (H) 03/24/2021 0328   O2SAT 99.9 03/24/2021 0328      Coagulation  Profile: Recent Labs  Lab 03/24/21 0026 03/25/21 0238  INR 2.7* 3.0*     Cardiac Enzymes: No results for input(s): CKTOTAL, CKMB, CKMBINDEX, TROPONINI in the last 168 hours.  HbA1C: Hemoglobin A1C  Date/Time Value Ref Range Status  07/03/2020 03:58 PM 6.5 (A) 4.0 - 5.6 % Final   HbA1c, POC (controlled diabetic range)  Date/Time Value Ref Range Status  10/14/2020 11:30 AM 6.1 0.0 - 7.0 % Final  09/21/2019 02:51 PM 8.7 (A) 0.0 - 7.0 % Final   Hgb A1c MFr Bld  Date/Time Value Ref Range Status  03/24/2021 07:03 AM 6.9 (H) 4.8 - 5.6 % Final    Comment:    (NOTE)         Prediabetes: 5.7 - 6.4         Diabetes: >6.4         Glycemic control for adults with diabetes: <7.0   04/03/2020 06:30 PM 7.5 (H) 4.8 - 5.6 % Final    Comment:    (NOTE) Pre diabetes:          5.7%-6.4%  Diabetes:              >6.4%  Glycemic control for   <7.0% adults with diabetes     CBG: Recent Labs  Lab 03/24/21 1549 03/24/21 1940 03/25/21 0001 03/25/21 0303 03/25/21 0745  GLUCAP 154* 138* 145* 130* 138*    Review of Systems:   Unable to assess   Past Medical History:  He,  has a past medical history of Aortic stenosis, Atrial fibrillation ([place]), CHF (congestive heart failure) ([place]), Coronary artery disease, Diabetes mellitus without complication ([place]), HLD (hyperlipidemia), Hypertension, and Peripheral arterial disease ([place]).   Surgical History:   Past Surgical History:  Procedure Laterality Date   ABDOMINAL AORTOGRAM W/LOWER EXTREMITY N/A 08/05/2020   Procedure: ABDOMINAL AORTOGRAM W/LOWER EXTREMITY;  Surgeon: Marty Heck, MD;  Location: [facility];  Service: Cardiovascular;  Laterality: N/A;   ABDOMINAL AORTOGRAM W/LOWER EXTREMITY N/A 11/13/2020   Procedure: ABDOMINAL AORTOGRAM W/LOWER EXTREMITY;  Surgeon: Cherre Robins, MD;  Location: [facility];  Service: Cardiovascular;  Laterality: N/A;   AMPUTATION Left 09/28/2019   Procedure: AMPUTATION BELOW KNEE;   Surgeon: Newt Minion, MD;  Location: [facility];  Service: Orthopedics;  Laterality: Left;   AMPUTATION Right 11/15/2020   Procedure: RIGHT GREAT TOE AMPUTATION;  Surgeon: Newt Minion, MD;  Location: [facility];  Service: Orthopedics;  Laterality: Right;   CARDIAC CATHETERIZATION     CARDIOVERSION N/A 10/05/2019   Procedure: CARDIOVERSION;  Surgeon: Croitoru,  Dani Gobble, MD;  Location: [facility];  Service: Cardiovascular;  Laterality: N/A;   FEMORAL-POPLITEAL BYPASS GRAFT Right 08/07/2020   Procedure: RIGHT FEMORAL TO BELOW KNEE POPLITEAL ARTERY BYPASS;  Surgeon: Waynetta Sandy, MD;  Location: [facility];  Service: Vascular;  Laterality: Right;   LEFT HEART CATH AND CORONARY ANGIOGRAPHY N/A 10/03/2019   Procedure: LEFT HEART CATH AND CORONARY ANGIOGRAPHY;  Surgeon: Lorretta Harp, MD;  Location: [facility];  Service: Cardiovascular;  Laterality: N/A;   PERIPHERAL VASCULAR INTERVENTION Right 08/05/2020   Procedure: PERIPHERAL VASCULAR INTERVENTION;  Surgeon: Marty Heck, MD;  Location: [facility];  Service: Cardiovascular;  Laterality: Right;  common Iliac   PERIPHERAL VASCULAR INTERVENTION Left 11/13/2020   Procedure: PERIPHERAL VASCULAR INTERVENTION;  Surgeon: Cherre Robins, MD;  Location: [facility];  Service: Cardiovascular;  Laterality: Left;   PERIPHERAL VASCULAR INTERVENTION Right 11/14/2020   Procedure: PERIPHERAL VASCULAR INTERVENTION;  Surgeon: Marty Heck, MD;  Location: [facility];  Service: Cardiovascular;  Laterality: Right;  POP/SFA STENT   TEE WITHOUT CARDIOVERSION N/A 10/05/2019   Procedure: TRANSESOPHAGEAL ECHOCARDIOGRAM (TEE);  Surgeon: Sanda Klein, MD;  Location: [facility];  Service: Cardiovascular;  Laterality: N/A;     Social History:   reports that he has been smoking cigarettes. He has a 25.00 pack-year smoking history. He has never used smokeless tobacco. He reports current alcohol use of about 6.0 standard drinks per  week. He reports that he does not use drugs.   Family History:  His family history includes Alcoholism in his father and mother.   Allergies No Known Allergies   Home Medications  Prior to Admission medications   Medication Sig Start Date End Date Taking? Authorizing Provider  acetaminophen (TYLENOL) 325 MG tablet Take 1-2 tablets (325-650 mg total) by mouth every 6 (six) hours as needed for mild pain (pain score 1-3 or temp > 100.5). Patient taking differently: Take 650 mg by mouth every 6 (six) hours as needed for mild pain (pain score 1-3 or temp > 100.5). 11/17/20  Yes Carollee Leitz, MD  albuterol (VENTOLIN HFA) 108 (90 Base) MCG/ACT inhaler INHALE 2 PUFFS INTO THE LUNGS EVERY 6 (SIX) HOURS AS NEEDED FOR WHEEZING OR SHORTNESS OF BREATH. 03/03/21  Yes McDiarmid, Blane Ohara, MD  amiodarone (PACERONE) 200 MG tablet Take 1 tablet (200 mg total) by mouth daily. 10/14/20  Yes Hensel, Jamal Collin, MD  atorvastatin (LIPITOR) 80 MG tablet TAKE 1 TABLET EVERY DAY Patient taking differently: Take 80 mg by mouth daily. 03/11/21  Yes O'Neal, Cassie Freer, MD  carvedilol (COREG) 12.5 MG tablet Take 6.25 mg by mouth 2 (two) times daily. 12/18/20  Yes [provider]  clopidogrel (PLAVIX) 75 MG tablet TAKE 1 TABLET EVERY DAY WITH BREAKFAST 03/17/21  Yes Hensel, Jamal Collin, MD  furosemide (LASIX) 40 MG tablet Take 1 tablet (40 mg total) by mouth daily as needed for fluid (swelling). 02/13/21  Yes Simmons-Robinson, Makiera, MD  gabapentin (NEURONTIN) 100 MG capsule Take 2 capsules (200 mg total) by mouth 3 (three) times daily. Patient taking differently: Take 100 mg by mouth daily. 11/17/20  Yes Carollee Leitz, MD  insulin NPH-regular Human (NOVOLIN 70/30 RELION) (70-30) 100 UNIT/ML injection Inject 15-30 Units into the skin in the morning and at bedtime. Inject 30 units into the skin with breakfast and 15 units with supper   Yes [provider]  metFORMIN (GLUCOPHAGE) 1000 MG tablet Take 1 tablet  (1,000 mg total) by mouth  daily. 09/18/20  Yes Hensel, Jamal Collin, MD  Nutritional Supplements (FEEDING SUPPLEMENT, NEPRO CARB STEADY,) LIQD Take 237 mLs by mouth 2 (two) times daily between meals. 11/22/20  Yes Ezequiel Essex, MD  oxymetazoline (AFRIN) 0.05 % nasal spray Place 1 spray into both nostrils 2 (two) times daily as needed for congestion.   Yes [provider]  warfarin (COUMADIN) 5 MG tablet Take 7.29m (one and a half tablets) on Mondays, Wednesdays and Fridays; Take 524m(one tablet) on Tuesdays, Thursdays, Saturdays and Sundays. OR as directed by Warfarin Clinic Patient taking differently: Take 5 mg by mouth daily in the afternoon. 12/11/20  Yes Hensel, WiJamal CollinMD  Blood Glucose Monitoring Suppl (TRUE METRIX METER) DEVI Use to test blood sugar three times daily. 11/20/19   HeZenia ResidesMD  Blood Glucose Monitoring Suppl (TRUE METRIX METER) w/Device KIT USE AS DIRECTED 03/25/20   HeZenia ResidesMD  fluticasone (FLONASE) 50 MCG/ACT nasal spray Place 2 sprays into both nostrils daily. 02/13/21   Simmons-Robinson, Makiera, MD  glucose blood (RELION TRUE METRIX TEST STRIPS) test strip Use to test blood sugar three times per day. 11/20/19   HeZenia ResidesMD  Multiple Vitamin (MULTIVITAMIN WITH MINERALS) TABS tablet Take 1 tablet by mouth daily. Patient not taking: No sig reported 11/18/20   WaCarollee LeitzMD  nicotine (NICODERM CQ - DOSED IN MG/24 HOURS) 14 mg/24hr patch Place 1 patch (14 mg total) onto the skin daily. Patient not taking: Reported on 01/26/2021 12/09/20   HeZenia ResidesMD  spironolactone (ALDACTONE) 25 MG tablet Take 25 mg by mouth daily. 03/04/21   [provider]  TRUEplus Lancets 33G MISC Use to test blood sugar three times per day. 11/20/19   HeZenia ResidesMD  umeclidinium bromide (INCRUSE ELLIPTA) 62.5 MCG/INH AEPB Inhale 1 puff into the lungs daily. Patient not taking: Reported on 01/26/2021 12/09/20   HeZenia ResidesMD    RyMitzi HansenMD Internal Medicine Resident PGY-3 [facility] 03/25/2021 8:17 AM

## 2021-03-25 NOTE — Procedures (Signed)
Extubation Procedure Note  Patient Details:   Name: Rodney [name] DOB: July 11, 1950 MRN: [id number]   Airway Documentation:  Airway 7.5 mm (Active)  Secured at (cm) 26 cm 03/25/21 0734  Measured From Lips 03/25/21 [place] 03/25/21 0734  Secured By [employer] 03/25/21 0734  Tube Holder Repositioned Yes 03/25/21 0734  Prone position No 03/25/21 0734  Cuff Pressure (cm H2O) Clear OR 27-39 CmH2O 03/25/21 0734  Site Condition Dry 03/25/21 0406   Vent end date: (not recorded) Vent end time: (not recorded)   Evaluation  O2 sats: stable throughout Complications: No apparent complications Patient did tolerate procedure well. Bilateral Breath Sounds: Diminished   Yes  Martha Clan 03/25/2021, 8:16 AM

## 2021-03-25 NOTE — Progress Notes (Signed)
eLink Physician-Brief Progress Note Patient Name: Rodney [name] DOB: 1950/12/04 MRN: [id number]   Date of Service  03/25/2021  HPI/Events of Note  Hypertension of SBP 175. HR in 80s . Patient usually on coreg, aldactone and lasix at home. No distress on camera.   eICU Interventions  Add PRN labetalol      Intervention Category Major Interventions: Hypertension - evaluation and management  Margaretmary Lombard 03/25/2021, 5:38 AM

## 2021-03-25 NOTE — Progress Notes (Signed)
Nutrition Brief Note  Patient identified on the Malnutrition Screening Tool (MST) Report with score of 2.0  Wt Readings from Last 15 Encounters:  03/25/21 105.9 kg  02/13/21 106.4 kg  01/26/21 99.8 kg  12/09/20 98.5 kg  11/18/20 110 kg  11/17/20 110.6 kg  10/29/20 101.8 kg  10/14/20 98.4 kg  09/10/20 100.7 kg  08/29/20 103.2 kg  08/14/20 110.8 kg  08/13/20 110.8 kg  07/30/20 109.3 kg  07/05/20 98.9 kg  07/03/20 106.9 kg    Body mass index is 31.66 kg/m. Patient meets criteria for obesity based on current BMI. Skin WDL.   Patient is influenza A positive. He was intubated yesterday at ~0230 and extubated today at ~0815.   Patient discussed in rounds this AM and with RN prior to rounds. Plan for diet advancement.   Patient sitting up in bed. He reports good appetite with no changes PTA. He shares that he and his girlfriend have been sick x1 week but otherwise doing well PTA.   Current diet order is NPO but being advanced to Carb Modified. Labs and medications reviewed.   No nutrition interventions warranted at this time. If nutrition issues arise, please consult RD.      Jarome Matin, MS, RD, LDN, CNSC Inpatient Clinical Dietitian RD pager # available in [place]  After hours/weekend pager # available in [facility]

## 2021-03-25 NOTE — Progress Notes (Signed)
OT Cancellation Note  Patient Details Name: Rodney [name] MRN: [id number] DOB: [birth year]   Cancelled Treatment:    Reason Eval/Treat Not Completed: Patient declined, no reason specified. Pt eating and declining therapy at this time. Pt stating "I plan to finish this food and then fall asleep." Pt without prosthetic in room and family unable to bring as wife is sick. Due to wounds on RLE, pt reports he has not been walking recently. Reports he scoots to/from w/c. Will return as schedule allows.   [place], OTR/L Acute Rehab Pager: [phone] Office: [phone] 03/25/2021, 5:02 PM

## 2021-03-26 ENCOUNTER — Inpatient Hospital Stay (HOSPITAL_COMMUNITY): Payer: Medicare PPO

## 2021-03-26 DIAGNOSIS — J9611 Chronic respiratory failure with hypoxia: Secondary | ICD-10-CM | POA: Diagnosis present

## 2021-03-26 DIAGNOSIS — J9602 Acute respiratory failure with hypercapnia: Secondary | ICD-10-CM

## 2021-03-26 DIAGNOSIS — J9601 Acute respiratory failure with hypoxia: Secondary | ICD-10-CM

## 2021-03-26 DIAGNOSIS — I248 Other forms of acute ischemic heart disease: Secondary | ICD-10-CM

## 2021-03-26 DIAGNOSIS — I2489 Other forms of acute ischemic heart disease: Secondary | ICD-10-CM

## 2021-03-26 DIAGNOSIS — N189 Chronic kidney disease, unspecified: Secondary | ICD-10-CM | POA: Diagnosis present

## 2021-03-26 DIAGNOSIS — N179 Acute kidney failure, unspecified: Secondary | ICD-10-CM | POA: Diagnosis present

## 2021-03-26 DIAGNOSIS — N1831 Chronic kidney disease, stage 3a: Secondary | ICD-10-CM | POA: Diagnosis present

## 2021-03-26 DIAGNOSIS — D509 Iron deficiency anemia, unspecified: Secondary | ICD-10-CM | POA: Diagnosis present

## 2021-03-26 HISTORY — DX: Other forms of acute ischemic heart disease: I24.89

## 2021-03-26 HISTORY — DX: Other forms of acute ischemic heart disease: I24.8

## 2021-03-26 LAB — GLUCOSE, CAPILLARY
Glucose-Capillary: 182 mg/dL — ABNORMAL HIGH (ref 70–99)
Glucose-Capillary: 126 mg/dL — ABNORMAL HIGH (ref 70–99)
Glucose-Capillary: 193 mg/dL — ABNORMAL HIGH (ref 70–99)
Glucose-Capillary: 238 mg/dL — ABNORMAL HIGH (ref 70–99)
Glucose-Capillary: 233 mg/dL — ABNORMAL HIGH (ref 70–99)

## 2021-03-26 LAB — CBC
HCT: 26.1 % — ABNORMAL LOW (ref 39.0–52.0)
Hemoglobin: 7.7 g/dL — ABNORMAL LOW (ref 13.0–17.0)
MCH: 25.8 pg — ABNORMAL LOW (ref 26.0–34.0)
MCHC: 29.5 g/dL — ABNORMAL LOW (ref 30.0–36.0)
MCV: 87.6 fL (ref 80.0–100.0)
Platelets: 241 10*3/uL (ref 150–400)
RBC: 2.98 MIL/uL — ABNORMAL LOW (ref 4.22–5.81)
RDW: 15.4 % (ref 11.5–15.5)
WBC: 12.7 10*3/uL — ABNORMAL HIGH (ref 4.0–10.5)
nRBC: 0 % (ref 0.0–0.2)

## 2021-03-26 LAB — BASIC METABOLIC PANEL
Anion gap: 8 (ref 5–15)
BUN: 66 mg/dL — ABNORMAL HIGH (ref 8–23)
CO2: 23 mmol/L (ref 22–32)
Calcium: 8 mg/dL — ABNORMAL LOW (ref 8.9–10.3)
Chloride: 103 mmol/L (ref 98–111)
Creatinine, Ser: 2.56 mg/dL — ABNORMAL HIGH (ref 0.61–1.24)
GFR, Estimated: 26 mL/min — ABNORMAL LOW (ref >60)
Glucose, Bld: 188 mg/dL — ABNORMAL HIGH (ref 70–99)
Potassium: 4.8 mmol/L (ref 3.5–5.1)
Sodium: 134 mmol/L — ABNORMAL LOW (ref 135–145)

## 2021-03-26 LAB — PHOSPHORUS: Phosphorus: 4.8 mg/dL — ABNORMAL HIGH (ref 2.5–4.6)

## 2021-03-26 LAB — OSMOLALITY, URINE: Osmolality, Ur: 464 mOsm/kg (ref 300–900)

## 2021-03-26 LAB — MAGNESIUM: Magnesium: 2 mg/dL (ref 1.7–2.4)

## 2021-03-26 LAB — PROCALCITONIN: Procalcitonin: 1.55 ng/mL

## 2021-03-26 LAB — NA AND K (SODIUM & POTASSIUM), RAND UR
Potassium Urine: 37 mmol/L
Sodium, Ur: 17 mmol/L

## 2021-03-26 LAB — PROTIME-INR
INR: 5 (ref 0.8–1.2)
Prothrombin Time: 46.7 seconds — ABNORMAL HIGH (ref 11.4–15.2)

## 2021-03-26 LAB — CREATININE, URINE, RANDOM: Creatinine, Urine: 89.81 mg/dL

## 2021-03-26 IMAGING — US US RENAL
1 series · 15 of 25 positions shown · non-contrast
Comparison: CT abdomen/pelvis [DATE], renal ultrasound
[DATE]

CLINICAL DATA: Acute kidney injury

EXAM:
RENAL / URINARY TRACT ULTRASOUND COMPLETE

[Series 1: us renal mc & wl · 15 of 26 slices shown]
[im 1/26]
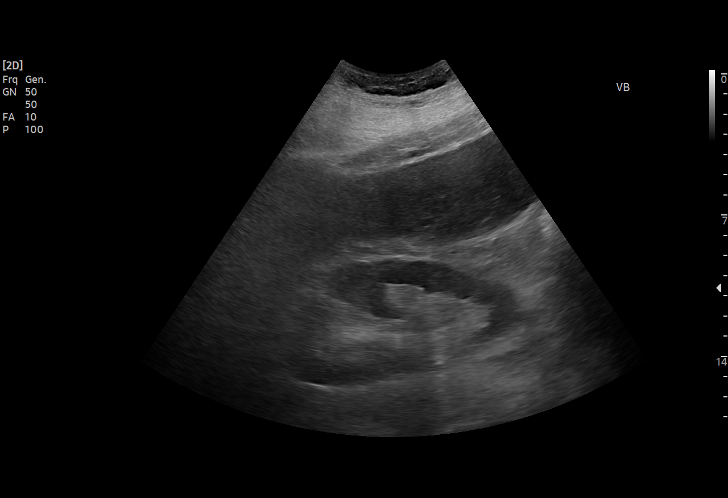
[im 3/26]
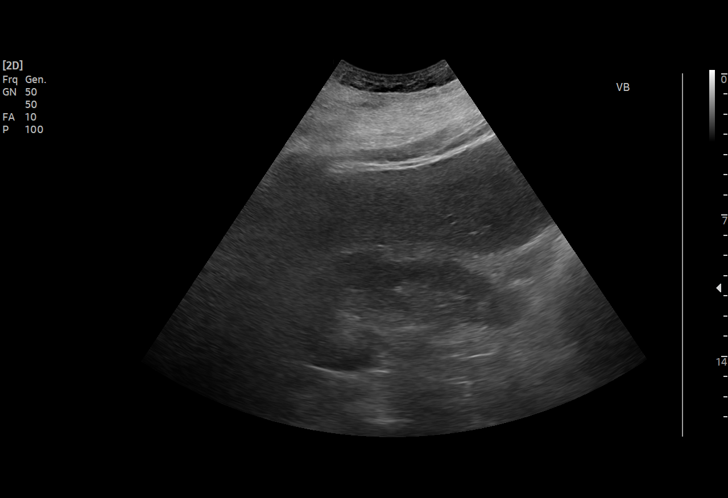
[im 5/26]
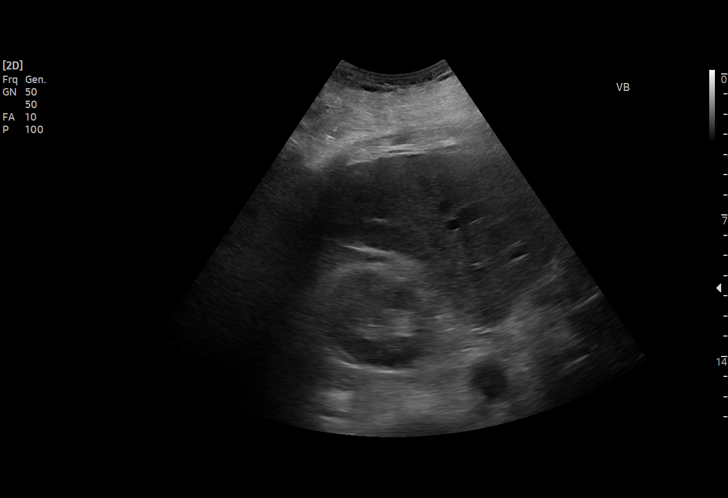
[im 6/26]
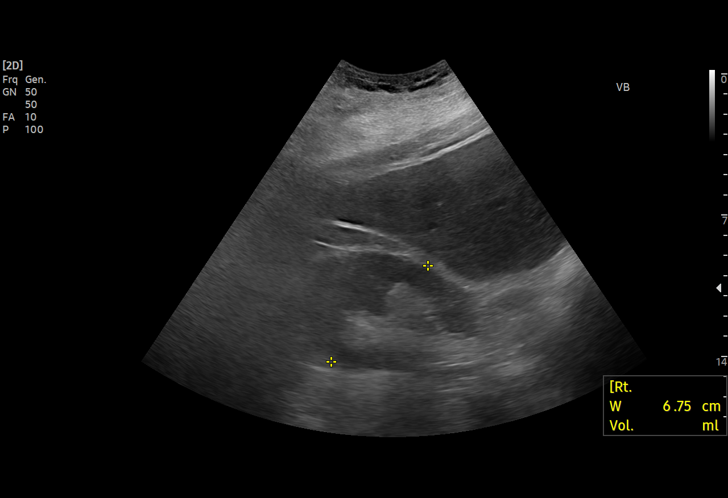
[im 8/26]
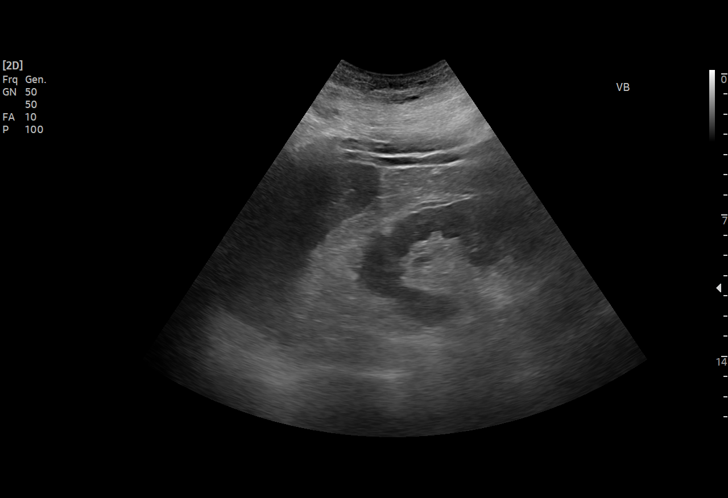
[im 10/26]
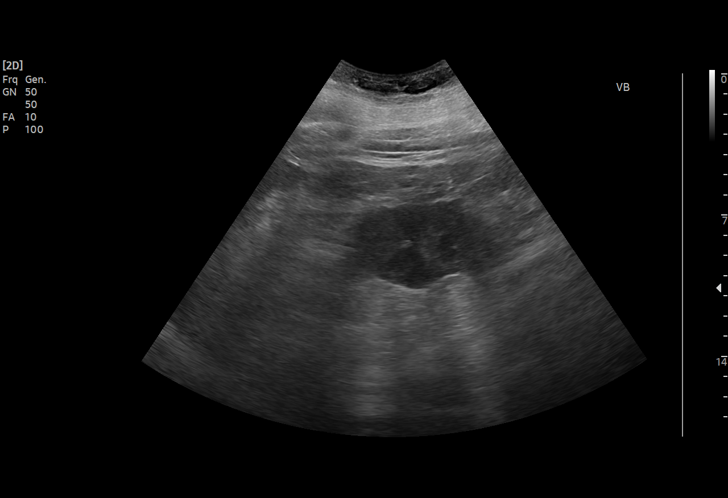
[im 11/26]
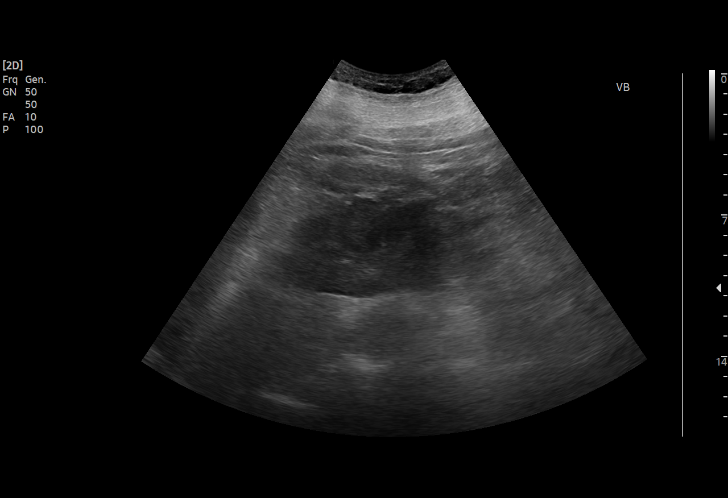
[im 13/26]
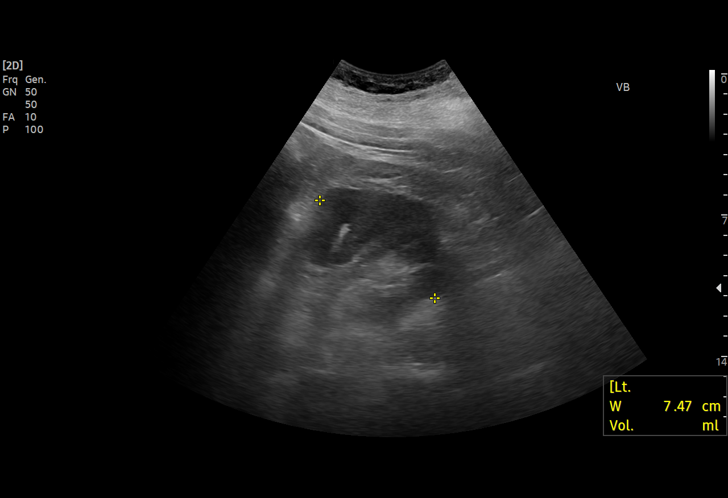
[im 15/26]
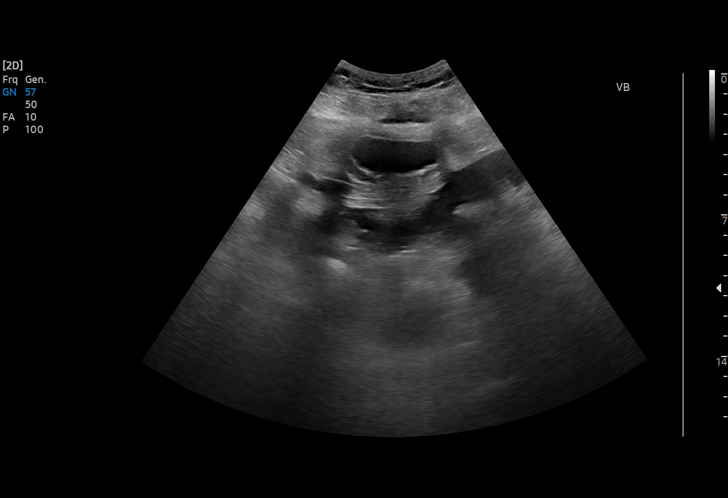
[im 16/26]
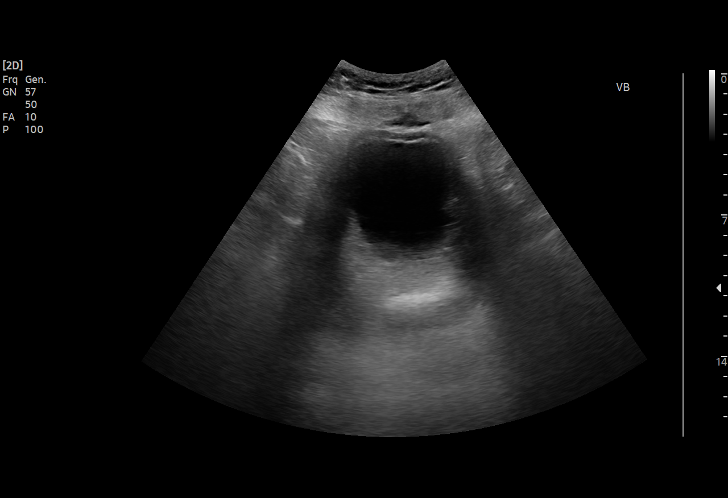
[im 18/26]
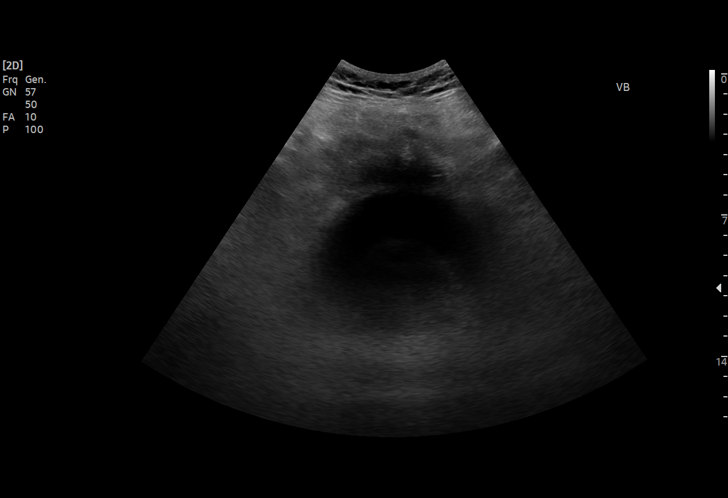
[im 20/26]
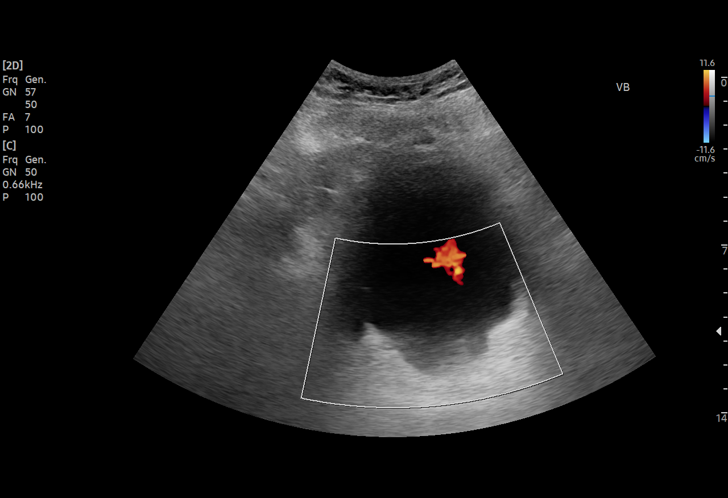
[im 21/26]
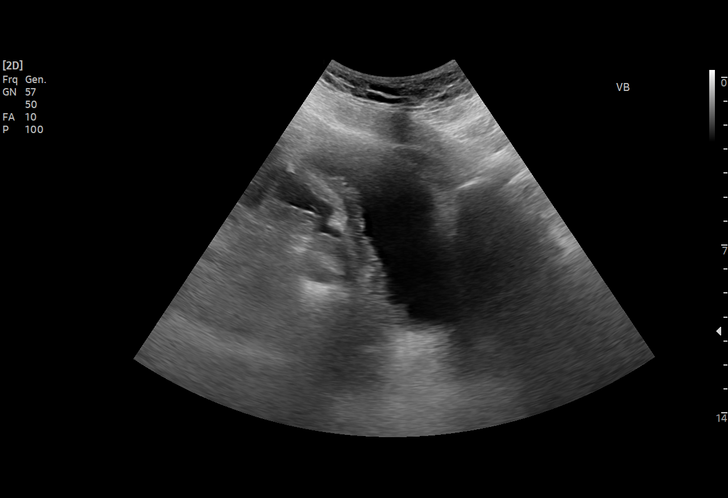
[im 23/26]
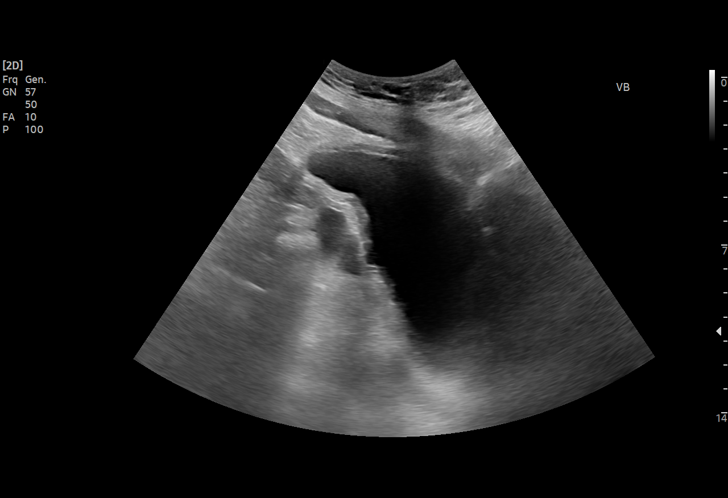
[im 26/26]
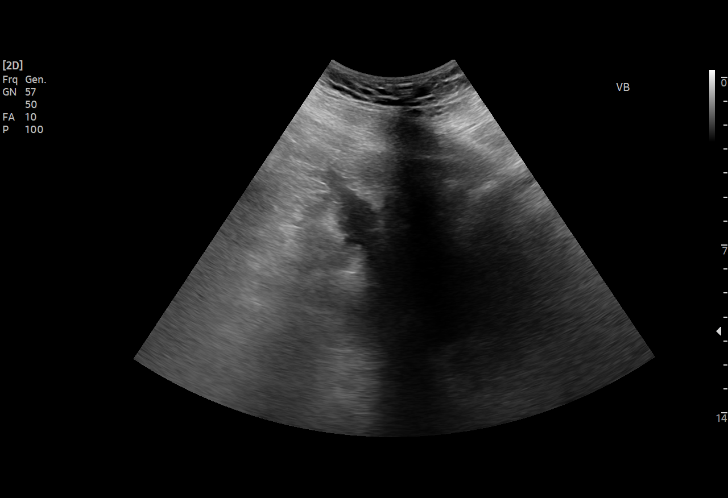

[15 of 25 positions shown; findings below may reference images not displayed]

FINDINGS: Right Kidney:

Renal measurements: 12.8 cm x 6.0 cm x 6.8 cm = volume: 273 mL.
Echogenicity within normal limits. No mass or hydronephrosis
visualized.

Left Kidney:

Renal measurements: 13.7 cm x 6.6 cm x 7.5 cm = volume: 154 mL.
Echogenicity within normal limits. No mass or hydronephrosis
visualized.

Bladder:

Appears normal for degree of bladder distention. Both ureteral jets
were identified.

Other:

None.
IMPRESSION: Normal renal ultrasound.

## 2021-03-26 MED ORDER — CARVEDILOL 6.25 MG PO TABS
6.2500 mg | ORAL_TABLET | Freq: Two times a day (BID) | ORAL | Status: DC
  Administered 2021-03-26 – 2021-03-28 (×5): 6.25 mg via ORAL
  Filled 2021-03-26 (×5): qty 1

## 2021-03-26 MED ORDER — INSULIN ASPART 100 UNIT/ML IJ SOLN
0.0000 [IU] | Freq: Three times a day (TID) | INTRAMUSCULAR | Status: DC
  Administered 2021-03-26: 4 [IU] via SUBCUTANEOUS
  Administered 2021-03-26: 7 [IU] via SUBCUTANEOUS
  Administered 2021-03-27: 4 [IU] via SUBCUTANEOUS
  Administered 2021-03-27: 3 [IU] via SUBCUTANEOUS
  Administered 2021-03-27: 11 [IU] via SUBCUTANEOUS
  Administered 2021-03-28: 4 [IU] via SUBCUTANEOUS

## 2021-03-26 MED ORDER — PREDNISONE 20 MG PO TABS
20.0000 mg | ORAL_TABLET | Freq: Every day | ORAL | Status: AC
  Administered 2021-03-28: 20 mg via ORAL
  Filled 2021-03-26: qty 1

## 2021-03-26 MED ORDER — PREDNISONE 10 MG PO TABS: 10.0000 mg | ORAL_TABLET | Freq: Every day | ORAL | Status: DC

## 2021-03-26 MED ORDER — GABAPENTIN 100 MG PO CAPS
100.0000 mg | ORAL_CAPSULE | Freq: Every day | ORAL | Status: DC
  Administered 2021-03-26 – 2021-03-27 (×2): 100 mg via ORAL
  Filled 2021-03-26 (×2): qty 1

## 2021-03-26 MED ORDER — NEPRO/CARBSTEADY PO LIQD
237.0000 mL | Freq: Two times a day (BID) | ORAL | Status: DC
  Administered 2021-03-26 – 2021-03-28 (×5): 237 mL via ORAL
  Filled 2021-03-26 (×6): qty 237

## 2021-03-26 MED ORDER — AMIODARONE HCL 200 MG PO TABS
200.0000 mg | ORAL_TABLET | Freq: Every day | ORAL | Status: DC
  Administered 2021-03-26 – 2021-03-28 (×3): 200 mg via ORAL
  Filled 2021-03-26 (×3): qty 1

## 2021-03-26 MED ORDER — BENZONATATE 100 MG PO CAPS
100.0000 mg | ORAL_CAPSULE | Freq: Three times a day (TID) | ORAL | Status: DC
  Administered 2021-03-26 – 2021-03-28 (×7): 100 mg via ORAL
  Filled 2021-03-26 (×7): qty 1

## 2021-03-26 MED ORDER — PREDNISONE 20 MG PO TABS
40.0000 mg | ORAL_TABLET | Freq: Every day | ORAL | Status: AC
  Administered 2021-03-27: 40 mg via ORAL
  Filled 2021-03-26: qty 2

## 2021-03-26 MED ORDER — ATORVASTATIN CALCIUM 40 MG PO TABS
80.0000 mg | ORAL_TABLET | Freq: Every day | ORAL | Status: DC
  Administered 2021-03-26 – 2021-03-28 (×3): 80 mg via ORAL
  Filled 2021-03-26 (×3): qty 2

## 2021-03-26 MED ORDER — AZITHROMYCIN 250 MG PO TABS
500.0000 mg | ORAL_TABLET | Freq: Every day | ORAL | Status: AC
  Administered 2021-03-26 – 2021-03-28 (×3): 500 mg via ORAL
  Filled 2021-03-26 (×3): qty 2

## 2021-03-26 MED ORDER — PANTOPRAZOLE SODIUM 40 MG PO TBEC
40.0000 mg | DELAYED_RELEASE_TABLET | Freq: Every day | ORAL | Status: DC
  Administered 2021-03-26 – 2021-03-28 (×3): 40 mg via ORAL
  Filled 2021-03-26 (×3): qty 1

## 2021-03-26 MED ORDER — CLOPIDOGREL BISULFATE 75 MG PO TABS
75.0000 mg | ORAL_TABLET | Freq: Every day | ORAL | Status: DC
  Administered 2021-03-26 – 2021-03-28 (×3): 75 mg via ORAL
  Filled 2021-03-26 (×3): qty 1

## 2021-03-26 MED ORDER — DM-GUAIFENESIN ER 30-600 MG PO TB12
1.0000 | ORAL_TABLET | Freq: Two times a day (BID) | ORAL | Status: DC
  Administered 2021-03-26 – 2021-03-28 (×5): 1 via ORAL
  Filled 2021-03-26 (×5): qty 1

## 2021-03-26 MED ORDER — INSULIN ASPART 100 UNIT/ML IJ SOLN
0.0000 [IU] | Freq: Every day | INTRAMUSCULAR | Status: DC
  Administered 2021-03-26: 2 [IU] via SUBCUTANEOUS
  Administered 2021-03-27: 4 [IU] via SUBCUTANEOUS

## 2021-03-26 NOTE — Assessment & Plan Note (Signed)
Currently rate controlled.  On amiodarone.  Continue.  On Coumadin.  Pharmacy dosing.

## 2021-03-26 NOTE — Assessment & Plan Note (Addendum)
See acute respiratory with hypoxia Continue droplet precaution.

## 2021-03-26 NOTE — Assessment & Plan Note (Signed)
Troponin elevated.  Peaked at 1200.  No chest pain.  No EKG changes to concern for acute ischemia.  No wall motion abnormality on echocardiogram, occasional PVC on telemetry. Continue Plavix, continue Coumadin, continue Coreg.  Monitor on telemetry.

## 2021-03-26 NOTE — Assessment & Plan Note (Addendum)
Presents with shortness of breath.   58% on room air on EMS arrival.  Initially on NRB.  Transition to BiPAP.  Failed.  Required intubation in the ER.  Admitted to the ICU.  Extubated. Secondary to combination of acute COPD exacerbation due to influenza ED as well as bacterial pneumonia. Currently on room air at rest.  Respiratory condition improving. Continue prednisone taper, Mucinex, DuoNebs, Tamiflu.  We will Continue short course of azithromycin.

## 2021-03-26 NOTE — Evaluation (Signed)
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 Brought to ED with hypoxia,  Trial of bipap in ED, but patient remained very tachypneic, so ultimately was intubated, extubated 03/25/2021. Positive for influenza A   Clinical Impression   Patient evaluated by Occupational Therapy with no further acute OT needs identified. All education has been completed and the patient has no further questions. Patient declined to participate in transfers. Patient participated in seated grooming tasks and reported he is at baseline at this time. Patient reported he does not want to participate in any therapy at this time. Patient reported he is at baseline and does not need it.  See below for any follow-up Occupational Therapy or equipment needs. OT is signing off. Thank you for this referral.       Recommendations for follow up therapy are one component of a multi-disciplinary discharge planning process, led by the attending physician.  Recommendations may be updated based on patient status, additional functional criteria and insurance authorization.   Follow Up Recommendations  No OT follow up    Assistance Recommended at Discharge PRN (girlfriend available at home)  Functional Status Assessment  Patient has not had a recent decline in their functional status  Equipment Recommendations  None recommended by OT (has all needed items)    Recommendations for Other Services       Precautions / Restrictions Precautions Precaution Comments: flu, L AKA Restrictions Weight Bearing Restrictions: No      Mobility Bed Mobility Overal bed mobility: Modified Independent                  Transfers                   General transfer comment: pt. declined.      Balance Overall balance assessment: Modified Independent                                          ADL either performed or assessed with clinical judgement   ADL Overall ADL's : At baseline                                       General ADL Comments: patient is MI for supine to sit on edge of bed with patient able to engage in dynamic sitting balance for grooming tasks and ROM of BUE. patient reported he was at his baseline at this time. patient declined to trial transfers at this time. patient reported " i am able to do this. i do not need you".     Vision Patient Visual Report: No change from baseline       Perception     Praxis      Pertinent Vitals/Pain Pain Assessment: No/denies pain     Hand Dominance Right   Extremity/Trunk Assessment Upper Extremity Assessment Upper Extremity Assessment: Overall WFL for tasks assessed   Lower Extremity Assessment Lower Extremity Assessment: Defer to PT evaluation RLE Deficits / Details: great toe amp. LLE Deficits / Details: BKA   Cervical / Trunk Assessment Cervical / Trunk Assessment: Normal   Communication Communication Communication: No difficulties   Cognition Arousal/Alertness: Awake/alert Behavior  During Therapy: WFL for tasks assessed/performed Overall Cognitive Status: Within Functional Limits for tasks assessed                                 General Comments: patient reported that he does not need or want therapy at this time. patient was limited in what he would demonstrate.     General Comments  sitting on EOB    Exercises     Shoulder Instructions      Home Living                                   Additional Comments: patient reported sleeping in livingroom on couch primarily., has ill fitting prosthesis that he does not use.      Prior Functioning/Environment               Mobility Comments: independnet transfer couch /BSC/WC ADLs Comments: at wheelchair level        OT Problem List:         OT Treatment/Interventions:      OT Goals(Current goals can be found in the care plan section) Acute Rehab OT Goals OT Goal Formulation: All assessment and education complete, DC therapy  OT Frequency:     Barriers to D/C:            Co-evaluation              AM-PAC OT "6 Clicks" Daily Activity     Outcome Measure Help from another person eating meals?: None Help from another person taking care of personal grooming?: None Help from another person toileting, which includes using toliet, bedpan, or urinal?: None Help from another person bathing (including washing, rinsing, drying)?: None Help from another person to put on and taking off regular upper body clothing?: None Help from another person to put on and taking off regular lower body clothing?: None 6 Click Score: 24   End of Session Nurse Communication: Mobility status  Activity Tolerance: Patient tolerated treatment well Patient left: in bed;with call bell/phone within reach  OT Visit Diagnosis: Unsteadiness on feet (R26.81)                Time: 7517-0017 OT Time Calculation (min): 12 min Charges:  OT General Charges $OT Visit: 1 Visit OT Evaluation $OT Eval Low Complexity: 1 Low  Leota Sauers, MS Acute Rehabilitation Department Office# [phone] Pager# [phone]   Marcellina Millin 03/26/2021, 1:31 PM

## 2021-03-26 NOTE — Assessment & Plan Note (Signed)
On Plavix.  Continue.  Outpatient follow-up with vascular surgery as recommended.

## 2021-03-26 NOTE — Progress Notes (Signed)
[facility] Progress Note Patient Name: Rodney [name] DOB: August 21, 1950 MRN: [id number]   Date of Service  03/26/2021  HPI/Events of Note  INR 5  eICU Interventions  Today's coumadin to be held.     Intervention Category Intermediate Interventions: OtherLeron Stoffers 03/26/2021, 5:44 AM

## 2021-03-26 NOTE — TOC Initial Note (Signed)
Transition of Care [facility]) - Initial/Assessment Note   Patient Details  Name: Rodney [name] MRN: [id number] Date of Birth: 1951/03/23  Transition of Care [facility]) CM/SW Contact:    Sherie Don, LCSW Phone Number: 03/26/2021, 1:33 PM  Clinical Narrative: Readmission checklist completed due to high readmission score. CSW spoke with patient to complete assessment. Per patient, he resides at home with his girlfriend, [place]. Patient receives assistance as needed for ADLs from his girlfriend. Patient has a wheelchair, rolling walker, cane, and crutches at home so there are no DME needs at this time. Girlfriend assists with transportation and will transport the patient home at discharge, so he will not need PTAR or wheelchair transport.  Expected Discharge Plan: Home/Self Care Barriers to Discharge: Continued Medical Work up  Patient Goals and CMS Choice Patient states their goals for this hospitalization and ongoing recovery are:: Return home with girlfriend Choice offered to / list presented to : NA  Expected Discharge Plan and Services Expected Discharge Plan: Home/Self Care In-house Referral: Clinical Social Work Post Acute Care Choice: NA Living arrangements for the past 2 months: Mobile Home           DME Arranged: N/A DME Agency: NA  Prior Living Arrangements/Services Living arrangements for the past 2 months: Mobile Home Lives with:: Significant Other Patient language and need for interpreter reviewed:: Yes Do you feel safe going back to the place where you live?: Yes      Need for Family Participation in Patient Care: Yes (Comment) Care giver support system in place?: Yes (comment) Criminal Activity/Legal Involvement Pertinent to Current Situation/Hospitalization: No - Comment as needed  Activities of Daily Living Home Assistive Devices/Equipment: Other (Comment) (pt unable to answer) ADL Screening (condition at time of admission) Patient's cognitive ability  adequate to safely complete daily activities?: No Is the patient deaf or have difficulty hearing?: Yes (per previous hx) Does the patient have difficulty seeing, even when wearing glasses/contacts?: No Does the patient have difficulty concentrating, remembering, or making decisions?: Yes Patient able to express need for assistance with ADLs?: No Does the patient have difficulty dressing or bathing?: Yes Independently performs ADLs?: No Communication: Needs assistance Is this a change from baseline?: Change from baseline, expected to last >3 days Dressing (OT): Needs assistance Is this a change from baseline?: Change from baseline, expected to last >3 days Grooming: Needs assistance Is this a change from baseline?: Change from baseline, expected to last >3 days Feeding: Needs assistance Is this a change from baseline?: Change from baseline, expected to last >3 days Bathing: Needs assistance Is this a change from baseline?: Change from baseline, expected to last >3 days Toileting: Needs assistance Is this a change from baseline?: Change from baseline, expected to last >3days In/Out Bed: Needs assistance Is this a change from baseline?: Change from baseline, expected to last >3 days Walks in Home: Needs assistance Is this a change from baseline?: Change from baseline, expected to last >3 days Does the patient have difficulty walking or climbing stairs?: Yes (admitted in respiratory distress) Weakness of Legs: Both Weakness of Arms/Hands: Both  Emotional Assessment Attitude/Demeanor/Rapport: Engaged Affect (typically observed): Appropriate Orientation: : Oriented to Self, Oriented to Place, Oriented to  Time, Oriented to Situation Alcohol / Substance Use: Tobacco Use Psych Involvement: No (comment)  Admission diagnosis:  Influenza A [J10.1] Patient Active Problem List   Diagnosis Date Noted   Acute respiratory failure with hypoxia and hypercarbia ([place]) 03/26/2021   Acute renal  failure superimposed  on stage 3a chronic kidney disease ([place]) 03/26/2021   Demand ischemia ([place]) 03/26/2021   Iron (Fe) deficiency anemia 03/26/2021   Influenza A with pneumonia 03/24/2021   Hyperkalemia 12/10/2020   COPD (chronic obstructive pulmonary disease) ([place]) 12/09/2020   Acute combined systolic and diastolic congestive heart failure ([place])    Peripheral neuropathy 11/11/2020   Open wound of right great toe    Supratherapeutic INR 11/10/2020   History of DVT (deep vein thrombosis) 11/10/2020   Acute on chronic anemia    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   CAD (coronary artery disease) 02/15/2020   CKD stage 3 due to type 2 diabetes mellitus ([place]) 02/15/2020   Long term (current) use of anticoagulants 12/29/2019   S/P BKA (below knee amputation) unilateral, left ([place]) 11/14/2019   High risk social situation 11/14/2019   AF (paroxysmal atrial fibrillation) ([place]) 10/03/2019   Severe sepsis ([place]) 09/28/2019   HFrEF (heart failure with reduced ejection fraction) ([place]) 09/28/2019   Moderate aortic stenosis 09/28/2019   Anemia in chronic illness 09/27/2019   Protein calorie malnutrition ([place])    PVD (peripheral vascular disease) ([place])    Umbilical hernia 45/06/8880   Obesity 10/09/2015   Tobacco abuse 09/07/2013   DM (diabetes mellitus), type 2 with neurological complications ([place]) 80/06/4915   Hypercholesteremia 10/28/2010   ERECTILE DYSFUNCTION 05/22/2009   Essential hypertension 01/23/2009   PCP:  [name], MD Pharmacy:   [place], [place] [address] [place] [address] Phone: [phone] Fax: [place] [address] - [place] ([place]), [place] - [place] [address] [place] ([place]) [place] [address] Phone: [phone] Fax: [phone]  [place] [facility] - [place], [place] [address] Phone:  [phone] Fax: [phone]  Readmission Risk Interventions Readmission Risk Prevention Plan 03/26/2021 11/17/2020 08/13/2020  Transportation Screening Complete Complete Complete  PCP or Specialist Appt within 3-5 Days - - Complete  [place] or [place] - - Complete  Social Work Consult for Recovery Care Planning/Counseling - - Complete  Palliative Care Screening - - Not Applicable  Medication Review (RN Care Manager) Complete Complete Complete  PCP or Specialist appointment within 3-5 days of discharge - Complete -  [place] or [place] - Complete -  SW Recovery Care/Counseling Consult Complete Complete -  Palliative Care Screening Not Applicable Not Applicable -  [place] Not Applicable Not Applicable -  Some recent data might be hidden

## 2021-03-26 NOTE — Assessment & Plan Note (Signed)
Baseline hemoglobin around 10.  On presentation hemoglobin 8.9.  Currently trended down to 7.7.  No evidence of acute bleeding.  Likely dilutional component here.  Monitor.  May require GI consultation if he has any bleeding.

## 2021-03-26 NOTE — Assessment & Plan Note (Addendum)
Iron level in 20s in the past.  Continue supplementation.

## 2021-03-26 NOTE — Hospital Course (Addendum)
11/28 admitted for influenza A causing respiratory failure.  Intubated in ER. 11/29 extubated. 11/30 transferred to [facility] service.  Renal function worsening. 12/1 BUN still trending up,creat better, urine outpt less.

## 2021-03-26 NOTE — Assessment & Plan Note (Addendum)
Met SIRS criteria on admission with fever T39, tachypnea and tachycardia with respiratory and renal failure. Organ damage with AKI as well as respiratory failure. Infection from influenza. Currently sepsis physiology resolved.

## 2021-03-26 NOTE — Assessment & Plan Note (Signed)
Exacerbation.  Currently improving.  Continue current therapy.

## 2021-03-26 NOTE — Progress Notes (Signed)
[place] for warfarin Indication: atrial fibrillation     No Known Allergies  Patient Measurements: Height: 6' (182.9 cm) Weight: 105.9 kg (233 lb 7.5 oz) IBW/kg (Calculated) : 77.6 Heparin Dosing Weight: 100 kg  Vital Signs: Temp: 97.8 F (36.6 C) (11/30 0300) Temp Source: Oral (11/30 0300) BP: 161/86 (11/30 0600) Pulse Rate: 72 (11/30 0600)  Labs: Recent Labs    03/24/21 0026 03/24/21 0350 03/25/21 0238 03/26/21 0240  HGB 8.9*  --  7.8* 7.7*  HCT 30.2*  --  26.3* 26.1*  PLT 324  --  241 241  LABPROT 28.3*  --  31.0* 46.7*  INR 2.7*  --  3.0* 5.0*  CREATININE 1.51*  --  2.27* 2.56*  TROPONINIHS 192* 1,274*  --   --     Estimated Creatinine Clearance: 34.2 mL/min (A) (by C-G formula based on SCr of 2.56 mg/dL (H)).   Medical History: Past Medical History:  Diagnosis Date   Aortic stenosis    moderate in 2022   Atrial fibrillation (HCC)    CHF (congestive heart failure) ([place])    Coronary artery disease    Diabetes mellitus without complication ([place])    HLD (hyperlipidemia)    Hypertension    Peripheral arterial disease ([place])     Medications:  PTA Warfarin 5mg  daily - LD 11/27 @ 1200  Assessment: 70 yr male with respiratory failure; + influenza A, possible pneumonia PMH significant for AFib and patient on warfarin PTA Warfarin resumed 11/28 with 4mg  dose  Extubated 11/29, passed swallow study, Carb mod diet  Today, 03/26/21  INR 5.0, supratherapeutic,  DDI with meds: azithromycin  Elevated INR due to decreased po intake & Azithromycin interaction   Goal of Therapy:  Heparin level 0.3-0.7 units/ml Monitor platelets by anticoagulation protocol: Yes   Plan:  Hold Warfarin today Daily PT/INR Monitor CBC  Monitor for signs and symptoms of bleeding  Minda Ditto PharmD [facility] [phone] 03/26/2021, 7:20 AM

## 2021-03-26 NOTE — Evaluation (Signed)
Physical Therapy Evaluation Patient Details Name: Rodney [name] MRN: [id number] DOB: Mar 22, 1951 Today's Date: 03/26/2021  History of Present Illness  70 yo man with hx of LBKA, DM,HTN, CHF,PAD,afib, FPBG, AS . Brought to ED with hypoxia,  Trial of bipap in ED, but patient remained very tachypneic, so ultimately was intubated, extubated 03/25/2021. Positive for influenza A  Clinical Impression  The patient  willing to sit on bed edge, reports that he does not need to demonstrate transfers and feels he is at his baseline. Patient  is WC dependent at home. Patient appears near functional baseline.  SPO2 >93% during mobility assessment.  Will keep patient for PT while in hospital in event that he will particvcipate to maintain current functional independence.     Recommendations for follow up therapy are one component of a multi-disciplinary discharge planning process, led by the attending physician.  Recommendations may be updated based on patient status, additional functional criteria and insurance authorization.  Follow Up Recommendations No PT follow up    Assistance Recommended at Discharge None  Functional Status Assessment Patient has not had a recent decline in their functional status  Equipment Recommendations  None recommended by PT    Recommendations for Other Services       Precautions / Restrictions Precautions Precaution Comments: flu Restrictions Weight Bearing Restrictions: No      Mobility  Bed Mobility Overal bed mobility: Modified Independent                  Transfers                   General transfer comment: pt. declined.    Ambulation/Gait                  Stairs            Wheelchair Mobility    Modified Rankin (Stroke Patients Only)       Balance Overall balance assessment: Modified Independent                                           Pertinent Vitals/Pain Pain Assessment: No/denies  pain    Home Living Family/patient expects to be discharged to:: Private residence Living Arrangements: Spouse/significant other Available Help at Discharge: Friend(s) Type of Home: Mobile home Home Access: Ramped entrance       Home Layout: One level Home Equipment: Wheelchair - manual;Tub bench;BSC/3in1 Additional Comments: patient reported sleeping in livingroom on couch primarily., has ill fitting prosthesis that he does not use.    Prior Function Prior Level of Function : Independent/Modified Independent             Mobility Comments: independnet transfer couch /BSC/WC ADLs Comments: at wheelchair level     Hand Dominance   Dominant Hand: Right    Extremity/Trunk Assessment        Lower Extremity Assessment Lower Extremity Assessment: RLE deficits/detail RLE Deficits / Details: great toe amp. LLE Deficits / Details: BKA       Communication   Communication: No difficulties  Cognition Arousal/Alertness: Awake/alert Behavior During Therapy: WFL for tasks assessed/performed Overall Cognitive Status: Within Functional Limits for tasks assessed                                 General Comments: adamant that  he does not require therapy, he can trnasfer and not willing to demonstrate.        General Comments General comments (skin integrity, edema, etc.): seated on bed edge,    Exercises     Assessment/Plan    PT Assessment Patient needs continued PT services  PT Problem List Decreased activity tolerance;Decreased mobility       PT Treatment Interventions Functional mobility training;Therapeutic activities;Patient/family education    PT Goals (Current goals can be found in the Care Plan section)  Acute Rehab PT Goals Patient Stated Goal: go home PT Goal Formulation: With patient Time For Goal Achievement: 04/09/21 Potential to Achieve Goals: Good    Frequency Min 2X/week   Barriers to discharge        Co-evaluation                AM-PAC PT "6 Clicks" Mobility  Outcome Measure Help needed turning from your back to your side while in a flat bed without using bedrails?: None Help needed moving from lying on your back to sitting on the side of a flat bed without using bedrails?: None Help needed moving to and from a bed to a chair (including a wheelchair)?: A Lot Help needed standing up from a chair using your arms (e.g., wheelchair or bedside chair)?: Total Help needed to walk in hospital room?: Total Help needed climbing 3-5 steps with a railing? : Total 6 Click Score: 13    End of Session   Activity Tolerance: Patient tolerated treatment well Patient left: in bed;with call bell/phone within reach Nurse Communication: Mobility status PT Visit Diagnosis: Muscle weakness (generalized) (M62.81)    Time: 5038-8828 PT Time Calculation (min) (ACUTE ONLY): 13 min   Charges:   PT Evaluation $PT Eval Low Complexity: [place] Pager [phone] Office [phone]   Claretha Cooper 03/26/2021, 9:49 AM

## 2021-03-26 NOTE — Assessment & Plan Note (Signed)
Echocardiogram this admission mentions severe aortic stenosis but also mentions no significant change from prior echocardiogram done in July which showed moderate aortic stenosis. Currently asymptomatic.  Outpatient follow-up with cardiology recommended.

## 2021-03-26 NOTE — Assessment & Plan Note (Addendum)
Hemoglobin A1c 6.9.  Blood sugar currently well controlled.  Continue sliding scale insulin. Resume gabapentin.

## 2021-03-26 NOTE — Assessment & Plan Note (Addendum)
At baseline serum creatinine around 1.1.  On presentation serum creatinine 1.5.  Worsened to 2.5.  Now improving. Likely combination of sepsis potentially causing ATN as well as renal etiology.  Patient received IV diuresis as since there was some concern for acute exacerbation of chronic systolic CHF. BUN worsening.  Likely combination of constipation as well as steroids. Avoid nephrotoxic medication. Renally dose medication. Ultrasound renal negative. Prerenal etiology bun still elevated, will monitor.

## 2021-03-26 NOTE — Assessment & Plan Note (Signed)
Blood pressure stable.  Monitor. 

## 2021-03-26 NOTE — Assessment & Plan Note (Signed)
On Coumadin.  INR therapeutic.

## 2021-03-26 NOTE — Assessment & Plan Note (Signed)
Continue statin. 

## 2021-03-26 NOTE — Assessment & Plan Note (Addendum)
Resolved. Received IV Lasix. Continue to hold.  Currently on room air.  Does not appear volume overloaded. Echo shows improved EF 50 to 55%.

## 2021-03-26 NOTE — Assessment & Plan Note (Signed)
Pharmacy dosing Coumadin.  Currently no evidence of bleeding.

## 2021-03-26 NOTE — Assessment & Plan Note (Addendum)
On Plavix.  Continue.

## 2021-03-26 NOTE — Assessment & Plan Note (Addendum)
Body mass index is 31.66 kg/m.  Placing the patient at high risk of poor outcome.  Monitor.

## 2021-03-26 NOTE — Assessment & Plan Note (Signed)
Uses wheelchair to get around.  PT recommends no therapy.

## 2021-03-26 NOTE — Progress Notes (Signed)
[facility] Progress Note  Patient: Rodney [name]    AVW:[id number]  DOA: 03/24/2021    Date of Service: the patient was seen and examined on 03/26/2021  Brief hospital course: 11/28 admitted for influenza A causing respiratory failure.  Intubated in ER. 11/29 extubated. 11/30 transferred to [facility] service.  Renal function worsening.  Assessment and Plan: * Acute respiratory failure with hypoxia and hypercarbia (HCC) Presents with shortness of breath.  58% on room air on EMS arrival.  Initially on NRB.  Transition to BiPAP.  Failed.  Required intubation in the ER.  Admitted to the ICU.  Extubated. Secondary to combination of acute COPD exacerbation due to influenza ED as well as bacterial pneumonia. Currently on room air at rest.  Respiratory condition improving. Continue prednisone taper, Mucinex, DuoNebs, Tamiflu.  We will Continue short course of azithromycin.  Influenza A with pneumonia See acute respiratory with hypoxia Continue droplet precaution.  COPD (chronic obstructive pulmonary disease) (HCC) Exacerbation.  Currently improving.  Continue current therapy.  Severe sepsis (HCC) Met SIRS criteria on admission with fever T39, tachypnea and tachycardia with respiratory and renal failure. Organ damage with AKI as well as respiratory failure. Infection from influenza. Currently sepsis physiology resolved.  Acute renal failure superimposed on stage 3a chronic kidney disease (HCC) At baseline serum creatinine around 1.1.  On presentation serum creatinine 1.5.  Currently worsening to 2.56. Likely combination of sepsis potentially causing ATN as well as renal etiology.  Patient received IV diuresis as since there was some concern for acute exacerbation of chronic systolic CHF. Avoid nephrotoxic medication. Renally dose medication. Ultrasound renal negative. Further work-up initiated.  Currently no indication for IV hydration.  Demand ischemia (HCC) Troponin elevated.   Peaked at 1200.  No chest pain.  No EKG changes to concern for acute ischemia.  No wall motion abnormality on echocardiogram, occasional PVC on telemetry. Continue Plavix, continue Coumadin, continue Coreg.  Monitor on telemetry.  Acute combined systolic and diastolic congestive heart failure (HCC) Resolved. Received IV Lasix. Currently on room air.  Does not appear volume overloaded. Echo shows improved EF 50 to 55%.  CAD (coronary artery disease) On Plavix.  Continue.   AF (paroxysmal atrial fibrillation) (HCC) Currently rate controlled.  On amiodarone.  Continue.  On Coumadin.  Pharmacy dosing.  Moderate aortic stenosis Echocardiogram this admission mentions severe aortic stenosis but also mentions no significant change from prior echocardiogram done in July which showed moderate aortic stenosis. Currently asymptomatic.  Outpatient follow-up with cardiology recommended.  DM (diabetes mellitus), type 2 with neurological complications (HCC) Hemoglobin A1c 6.9.  Blood sugar currently well controlled.  Continue sliding scale insulin.  History of DVT (deep vein thrombosis) On Coumadin.  INR therapeutic.  Supratherapeutic INR Pharmacy dosing Coumadin.  Currently no evidence of bleeding.  Essential hypertension Blood pressure stable.  Monitor.  Anemia in chronic illness Baseline hemoglobin around 10.  On presentation hemoglobin 8.9.  Currently trended down to 7.7.  No evidence of acute bleeding.  Likely dilutional component here.  Monitor.  May require GI consultation if he has any bleeding.  Iron (Fe) deficiency anemia Iron level in 20s in the past.  Continue instrumentation.  Obesity Body mass index is 31.66 kg/m.  Placing the patient at high risk of poor outcome.  Monitor.  S/P BKA (below knee amputation) unilateral, left ([place]) Uses wheelchair to get around.  PT recommends no therapy.  PVD (peripheral vascular disease) (HCC) On Plavix.  Continue.  Outpatient follow-up  with vascular surgery  as recommended.  Hypercholesteremia Continue statin.    Body mass index is 31.66 kg/m.        Subjective: Breathing improving.  Has dry cough.  No nausea or vomiting.  No fever no chills.  No bowel movement but passing gas.  Objective: Hypertensive.  Tachypneic.  Exam: General: Appear in mild distress, no Rash; Oral Mucosa Clear, moist. no Abnormal Neck Mass Or lumps, Conjunctiva normal  Cardiovascular: S1 and S2 Present, no Murmur, Respiratory: increased respiratory effort, Bilateral Air entry present and bilateral  Crackles, no wheezes Abdomen: Bowel Sound present, Soft and no tenderness Extremities: trace Pedal edema Neurology: alert and oriented to time, place, and person affect appropriate. no new focal deficit Gait not checked due to patient safety concerns    Data Reviewed: My review of labs, imaging, notes and other tests is significant for     worsening renal function, stable hemoglobin and leukocytosis.  Disposition:  Status is: Inpatient  Remains inpatient appropriate because: Renal function continues to worsen.  May require nephrology consultation.  Currently performing further work-up.   Family Communication: None at bedside.  DVT Prophylaxis: SCDs Start: 03/24/21 0526   Time spent: 35 minutes.   Author: Berle Mull  03/26/2021 1:43 PM  To reach On-call, see care teams to locate the attending and reach out via [email or web address]. Between 7PM-7AM, please contact night-coverage If you still have difficulty reaching the attending provider, please page the [facility] : [facility] (Director on Call) for [facility] on amion for assistance.

## 2021-03-27 DIAGNOSIS — Z4781 Encounter for orthopedic aftercare following surgical amputation: Secondary | ICD-10-CM | POA: Diagnosis not present

## 2021-03-27 DIAGNOSIS — E1122 Type 2 diabetes mellitus with diabetic chronic kidney disease: Secondary | ICD-10-CM | POA: Diagnosis not present

## 2021-03-27 DIAGNOSIS — D631 Anemia in chronic kidney disease: Secondary | ICD-10-CM | POA: Diagnosis not present

## 2021-03-27 DIAGNOSIS — L89511 Pressure ulcer of right ankle, stage 1: Secondary | ICD-10-CM | POA: Diagnosis not present

## 2021-03-27 DIAGNOSIS — N183 Chronic kidney disease, stage 3 unspecified: Secondary | ICD-10-CM | POA: Diagnosis not present

## 2021-03-27 DIAGNOSIS — I872 Venous insufficiency (chronic) (peripheral): Secondary | ICD-10-CM | POA: Diagnosis not present

## 2021-03-27 DIAGNOSIS — I5020 Unspecified systolic (congestive) heart failure: Secondary | ICD-10-CM | POA: Diagnosis not present

## 2021-03-27 DIAGNOSIS — I130 Hypertensive heart and chronic kidney disease with heart failure and stage 1 through stage 4 chronic kidney disease, or unspecified chronic kidney disease: Secondary | ICD-10-CM | POA: Diagnosis not present

## 2021-03-27 DIAGNOSIS — J9601 Acute respiratory failure with hypoxia: Secondary | ICD-10-CM | POA: Diagnosis not present

## 2021-03-27 DIAGNOSIS — E1151 Type 2 diabetes mellitus with diabetic peripheral angiopathy without gangrene: Secondary | ICD-10-CM | POA: Diagnosis not present

## 2021-03-27 DIAGNOSIS — J9602 Acute respiratory failure with hypercapnia: Secondary | ICD-10-CM | POA: Diagnosis not present

## 2021-03-27 LAB — BASIC METABOLIC PANEL
Anion gap: 8 (ref 5–15)
BUN: 75 mg/dL — ABNORMAL HIGH (ref 8–23)
CO2: 23 mmol/L (ref 22–32)
Calcium: 8.1 mg/dL — ABNORMAL LOW (ref 8.9–10.3)
Chloride: 104 mmol/L (ref 98–111)
Creatinine, Ser: 2.31 mg/dL — ABNORMAL HIGH (ref 0.61–1.24)
GFR, Estimated: 30 mL/min — ABNORMAL LOW (ref >60)
Glucose, Bld: 194 mg/dL — ABNORMAL HIGH (ref 70–99)
Potassium: 4.6 mmol/L (ref 3.5–5.1)
Sodium: 135 mmol/L (ref 135–145)

## 2021-03-27 LAB — CBC
HCT: 26 % — ABNORMAL LOW (ref 39.0–52.0)
Hemoglobin: 7.7 g/dL — ABNORMAL LOW (ref 13.0–17.0)
MCH: 25.1 pg — ABNORMAL LOW (ref 26.0–34.0)
MCHC: 29.6 g/dL — ABNORMAL LOW (ref 30.0–36.0)
MCV: 84.7 fL (ref 80.0–100.0)
Platelets: 237 10*3/uL (ref 150–400)
RBC: 3.07 MIL/uL — ABNORMAL LOW (ref 4.22–5.81)
RDW: 15.2 % (ref 11.5–15.5)
WBC: 12.6 10*3/uL — ABNORMAL HIGH (ref 4.0–10.5)
nRBC: 0.2 % (ref 0.0–0.2)

## 2021-03-27 LAB — GLUCOSE, CAPILLARY
Glucose-Capillary: 146 mg/dL — ABNORMAL HIGH (ref 70–99)
Glucose-Capillary: 188 mg/dL — ABNORMAL HIGH (ref 70–99)
Glucose-Capillary: 258 mg/dL — ABNORMAL HIGH (ref 70–99)
Glucose-Capillary: 301 mg/dL — ABNORMAL HIGH (ref 70–99)

## 2021-03-27 LAB — UREA NITROGEN, URINE: Urea Nitrogen, Ur: 820 mg/dL

## 2021-03-27 LAB — PROTIME-INR
INR: 3.4 — ABNORMAL HIGH (ref 0.8–1.2)
Prothrombin Time: 34.2 seconds — ABNORMAL HIGH (ref 11.4–15.2)

## 2021-03-27 MED ORDER — GABAPENTIN 100 MG PO CAPS
100.0000 mg | ORAL_CAPSULE | Freq: Three times a day (TID) | ORAL | Status: DC
  Administered 2021-03-27 – 2021-03-28 (×3): 100 mg via ORAL
  Filled 2021-03-27 (×3): qty 1

## 2021-03-27 NOTE — Progress Notes (Signed)
[facility] Progress Note  Patient: Rodney [name]    XTG:[id number]  DOA: 03/24/2021    Date of Service: the patient was seen and examined on 03/27/2021  Brief hospital course: 11/28 admitted for influenza A causing respiratory failure.  Intubated in ER. 11/29 extubated. 11/30 transferred to [facility] service.  Renal function worsening. 12/1 BUN still trending up,creat better, urine outpt less.   Assessment and Plan: * Acute respiratory failure with hypoxia and hypercarbia (HCC) Presents with shortness of breath.   58% on room air on EMS arrival.  Initially on NRB.  Transition to BiPAP.  Failed.  Required intubation in the ER.  Admitted to the ICU.  Extubated. Secondary to combination of acute COPD exacerbation due to influenza ED as well as bacterial pneumonia. Currently on room air at rest.  Respiratory condition improving. Continue prednisone taper, Mucinex, DuoNebs, Tamiflu.  We will Continue short course of azithromycin.  Influenza A with pneumonia See acute respiratory with hypoxia Continue droplet precaution.  COPD (chronic obstructive pulmonary disease) (HCC) Exacerbation.  Currently improving.  Continue current therapy.  Severe sepsis (HCC) Met SIRS criteria on admission with fever T39, tachypnea and tachycardia with respiratory and renal failure. Organ damage with AKI as well as respiratory failure. Infection from influenza. Currently sepsis physiology resolved.  Acute renal failure superimposed on stage 3a chronic kidney disease (HCC) At baseline serum creatinine around 1.1.  On presentation serum creatinine 1.5.  Worsened to 2.5.  Now improving. Likely combination of sepsis potentially causing ATN as well as renal etiology.  Patient received IV diuresis as since there was some concern for acute exacerbation of chronic systolic CHF. BUN worsening.  Likely combination of constipation as well as steroids. Avoid nephrotoxic medication. Renally dose  medication. Ultrasound renal negative. Prerenal etiology bun still elevated, will monitor.   Demand ischemia (HCC) Troponin elevated.  Peaked at 1200.  No chest pain.  No EKG changes to concern for acute ischemia.  No wall motion abnormality on echocardiogram, occasional PVC on telemetry. Continue Plavix, continue Coumadin, continue Coreg.  Monitor on telemetry.  Acute combined systolic and diastolic congestive heart failure (HCC) Resolved. Received IV Lasix. Continue to hold.  Currently on room air.  Does not appear volume overloaded. Echo shows improved EF 50 to 55%.  CAD (coronary artery disease) On Plavix.  Continue.   AF (paroxysmal atrial fibrillation) (HCC) Currently rate controlled.  On amiodarone.  Continue.  On Coumadin.  Pharmacy dosing.  Moderate aortic stenosis Echocardiogram this admission mentions severe aortic stenosis but also mentions no significant change from prior echocardiogram done in July which showed moderate aortic stenosis. Currently asymptomatic.  Outpatient follow-up with cardiology recommended.  DM (diabetes mellitus), type 2 with neurological complications (HCC) Hemoglobin A1c 6.9.  Blood sugar currently well controlled.  Continue sliding scale insulin. Resume gabapentin.   History of DVT (deep vein thrombosis) On Coumadin.  INR therapeutic.  Supratherapeutic INR Pharmacy dosing Coumadin.  Currently no evidence of bleeding.  Essential hypertension Blood pressure stable.  Monitor.  Anemia in chronic illness Baseline hemoglobin around 10.  On presentation hemoglobin 8.9.  Currently trended down to 7.7.  No evidence of acute bleeding.  Likely dilutional component here.  Monitor.  May require GI consultation if he has any bleeding.  Iron (Fe) deficiency anemia Iron level in 20s in the past.  Continue supplementation.  Obesity Body mass index is 31.66 kg/m.  Placing the patient at high risk of poor outcome.  Monitor.  S/P BKA (below knee  amputation)  unilateral, left [facility]) Uses wheelchair to get around.  PT recommends no therapy.  PVD (peripheral vascular disease) (HCC) On Plavix.  Continue.  Outpatient follow-up with vascular surgery as recommended.  Hypercholesteremia Continue statin.  Body mass index is 31.63 kg/m.   Subjective: Better on oxygen.  Has some shortness of breath.  No nausea no vomiting.  Still has some mucus.  Reports worsening pain in the leg.  Objective: Blood pressure is mildly elevated.   Exam: General: Appear in mild distress, no Rash; Oral Mucosa Clear, moist. no Abnormal Neck Mass Or lumps, Conjunctiva normal  Cardiovascular: S1 and S2 Present, no Murmur, Respiratory: increased respiratory effort, Bilateral Air entry present and bilateral  Crackles, no wheezes Abdomen: Bowel Sound present, Soft and no tenderness Extremities: trace Pedal edema Neurology: alert and oriented to time, place, and person affect appropriate. no new focal deficit Gait not checked due to patient safety concerns    Data Reviewed: Serum creatinine improving.  BUN worsening.  Disposition:  Status is: Inpatient  Remains inpatient appropriate because: Worsening BUN.  Would like to monitor for stability or improvement.  Level significantly elevated with the patient at risk for uremia.  Family Communication: none at bedside.   DVT Prophylaxis: SCDs Start: 03/24/21 0526   Time spent: 35 minutes.   Author: Berle Mull  03/27/2021 4:04 PM  To reach On-call, see care teams to locate the attending and reach out via [email or web address]. Between 7PM-7AM, please contact night-coverage If you still have difficulty reaching the attending provider, please page the [facility] (Director on Call) for [facility] on amion for assistance.

## 2021-03-27 NOTE — Progress Notes (Signed)
[place] for warfarin Indication: atrial fibrillation     No Known Allergies  Patient Measurements: Height: 6' (182.9 cm) Weight: 105.8 kg (233 lb 4 oz) IBW/kg (Calculated) : 77.6 Heparin Dosing Weight: 100 kg  Vital Signs: Temp: 98 F (36.7 C) (12/01 0400) Temp Source: Oral (12/01 0400) BP: 156/76 (12/01 0400) Pulse Rate: 62 (12/01 0400)  Labs: Recent Labs    03/25/21 0238 03/26/21 0240 03/27/21 0233  HGB 7.8* 7.7* 7.7*  HCT 26.3* 26.1* 26.0*  PLT 241 241 237  LABPROT 31.0* 46.7* 34.2*  INR 3.0* 5.0* 3.4*  CREATININE 2.27* 2.56* 2.31*    Estimated Creatinine Clearance: 38 mL/min (A) (by C-G formula based on SCr of 2.31 mg/dL (H)).   Medical History: Past Medical History:  Diagnosis Date   Aortic stenosis    moderate in 2022   Atrial fibrillation (HCC)    CHF (congestive heart failure) ([place])    Coronary artery disease    Diabetes mellitus without complication ([place])    HLD (hyperlipidemia)    Hypertension    Peripheral arterial disease ([place])     Medications:  PTA Warfarin 5mg  daily - LD 11/27 @ 1200  Assessment: 70 yr male with respiratory failure; + influenza A, possible pneumonia PMH significant for AFib and patient on warfarin PTA Warfarin resumed 11/28 with 4mg  dose  Extubated 11/29, passed swallow study, Carb mod diet  Today, 03/27/21  INR 3.4, supratherapeutic,  DDI with meds: azithromycin  Elevated INR due to decreased po intake & Azithromycin interaction   Goal of Therapy:  Heparin level 0.3-0.7 units/ml Monitor platelets by anticoagulation protocol: Yes   Plan:  Hold Warfarin today Daily PT/INR Monitor CBC  Monitor for signs and symptoms of bleeding   Royetta Asal, PharmD, BCPS 03/27/2021 8:05 AM

## 2021-03-27 NOTE — TOC Progression Note (Signed)
Transition of Care [facility]) - Progression Note    Patient Details  Name: Rodney [name] MRN: [id number] Date of Birth: 1950-06-05  Transition of [facility]) CM/SW Contact  Leeroy Cha, RN Phone Number: 03/27/2021, 8:52 AM  Clinical Narrative:    Patient remains in icu, renal state bun-75,creat.'=2.31 gfr 30 wbc 12.6 hgb 7.7 TOC PLAN OF CARE: following for toc needs none at present from home lives alone. Has a significant other for support. Following for progression.   Expected Discharge Plan: Home/Self Care Barriers to Discharge: Continued Medical Work up  Expected Discharge Plan and Services Expected Discharge Plan: Home/Self Care In-house Referral: Clinical Social Work   Post Acute Care Choice: NA Living arrangements for the past 2 months: Mobile Home                 DME Arranged: N/A DME Agency: NA                   Social Determinants of Health (SDOH) Interventions    Readmission Risk Interventions Readmission Risk Prevention Plan 03/26/2021 11/17/2020 08/13/2020  Transportation Screening Complete Complete Complete  PCP or Specialist Appt within 3-5 Days - - Complete  [place] or [place] - - Complete  Social Work Consult for [place] Planning/Counseling - - Complete  Palliative Care Screening - - Not Applicable  Medication Review Press photographer) Complete Complete Complete  PCP or Specialist appointment within 3-5 days of discharge - Complete -  [place] or [place] - Complete -  SW Recovery Care/Counseling Consult Complete Complete -  Palliative Care Screening Not Applicable Not Applicable -  [place] Not Applicable Not Applicable -  Some recent data might be hidden

## 2021-03-28 DIAGNOSIS — J9602 Acute respiratory failure with hypercapnia: Secondary | ICD-10-CM | POA: Diagnosis not present

## 2021-03-28 DIAGNOSIS — J9601 Acute respiratory failure with hypoxia: Secondary | ICD-10-CM | POA: Diagnosis not present

## 2021-03-28 LAB — BASIC METABOLIC PANEL
Anion gap: 7 (ref 5–15)
BUN: 78 mg/dL — ABNORMAL HIGH (ref 8–23)
CO2: 24 mmol/L (ref 22–32)
Calcium: 8.1 mg/dL — ABNORMAL LOW (ref 8.9–10.3)
Chloride: 103 mmol/L (ref 98–111)
Creatinine, Ser: 1.98 mg/dL — ABNORMAL HIGH (ref 0.61–1.24)
GFR, Estimated: 36 mL/min — ABNORMAL LOW (ref >60)
Glucose, Bld: 216 mg/dL — ABNORMAL HIGH (ref 70–99)
Potassium: 4.6 mmol/L (ref 3.5–5.1)
Sodium: 134 mmol/L — ABNORMAL LOW (ref 135–145)

## 2021-03-28 LAB — CBC
HCT: 25.6 % — ABNORMAL LOW (ref 39.0–52.0)
Hemoglobin: 7.8 g/dL — ABNORMAL LOW (ref 13.0–17.0)
MCH: 25.8 pg — ABNORMAL LOW (ref 26.0–34.0)
MCHC: 30.5 g/dL (ref 30.0–36.0)
MCV: 84.8 fL (ref 80.0–100.0)
Platelets: 226 10*3/uL (ref 150–400)
RBC: 3.02 MIL/uL — ABNORMAL LOW (ref 4.22–5.81)
RDW: 14.9 % (ref 11.5–15.5)
WBC: 9.8 10*3/uL (ref 4.0–10.5)
nRBC: 0 % (ref 0.0–0.2)

## 2021-03-28 LAB — PROTIME-INR
INR: 1.9 — ABNORMAL HIGH (ref 0.8–1.2)
Prothrombin Time: 21.9 seconds — ABNORMAL HIGH (ref 11.4–15.2)

## 2021-03-28 LAB — GLUCOSE, CAPILLARY: Glucose-Capillary: 185 mg/dL — ABNORMAL HIGH (ref 70–99)

## 2021-03-28 MED ORDER — POLYETHYLENE GLYCOL 3350 17 G PO PACK: 17.0000 g | PACK | Freq: Every day | ORAL | 0 refills | Status: DC | PRN

## 2021-03-28 MED ORDER — BENZONATATE 100 MG PO CAPS: 100.0000 mg | ORAL_CAPSULE | Freq: Three times a day (TID) | ORAL | 0 refills | Status: DC

## 2021-03-28 MED ORDER — METFORMIN HCL 1000 MG PO TABS: 1000.0000 mg | ORAL_TABLET | Freq: Every day | ORAL | 3 refills | Status: DC

## 2021-03-28 MED ORDER — WARFARIN SODIUM 5 MG PO TABS
5.0000 mg | ORAL_TABLET | Freq: Once | ORAL | Status: AC
  Administered 2021-03-28: 5 mg via ORAL
  Filled 2021-03-28: qty 1

## 2021-03-28 MED ORDER — DM-GUAIFENESIN ER 30-600 MG PO TB12: 1.0000 | ORAL_TABLET | Freq: Two times a day (BID) | ORAL | 0 refills | Status: DC

## 2021-03-28 MED ORDER — HYDRALAZINE HCL 20 MG/ML IJ SOLN
10.0000 mg | Freq: Once | INTRAMUSCULAR | Status: AC
  Administered 2021-03-28: 10 mg via INTRAVENOUS
  Filled 2021-03-28: qty 1

## 2021-03-28 NOTE — Plan of Care (Signed)

## 2021-03-28 NOTE — Progress Notes (Addendum)
Inpatient Diabetes Program Recommendations  AACE/ADA: New Consensus Statement on Inpatient Glycemic Control (2015)  Target Ranges:  Prepandial:   less than 140 mg/dL      Peak postprandial:   less than 180 mg/dL (1-2 hours)      Critically ill patients:  140 - 180 mg/dL   Lab Results  Component Value Date   GLUCAP 301 (H) 03/27/2021   HGBA1C 6.9 (H) 03/24/2021    Review of Glycemic Control  Diabetes history: DM 2 Outpatient Diabetes medications: 70/30 30 units qam, 15 units qpm, metformin 1000 mg bid Current orders for Inpatient glycemic control:  Metformin 1000 mg daily Novolog 0-20 units tid + hs  Nepro bid between meals  Inpatient Diabetes Program Recommendations:    Note: po prednisone decreased to 10 mg Daily, Metformin 1000 mg Daily restarted today  - may benefit from Novolog 2 units tid meal coverage if eating >50% of meals  Thanks,  Tama Headings RN, MSN, BC-ADM Inpatient Diabetes Coordinator Team Pager [phone] (8a-5p)

## 2021-03-28 NOTE — Consult Note (Signed)
[facility] Inpatient Consult   03/28/2021  DAYVEN LINSLEY 09/26/1950 [id number]  [place] [facility] CM)   Patient chart reviewed due to extreme unplanned readmission risk score for [facility] CM community care needs. Primary Care Provider office offers embedded chronic care management (CCM) team and program, and is listed for the transition of care follow up and appointments.   Per review, referral for CCM services initiated in July 2022 with primary office. Noted 3 unsuccessful attempts to reach patient and case closed. Patient remains eligible for [facility] CM services if transitions to home setting.  Plan: Will continue to follow for progression and disposition plans.  Of note, [facility] services does not replace or interfere with any services that are arranged by inpatient case management or social work.   Netta Cedars, MSN, RN [facility] [employer] [phone]  Toll free office [phone]

## 2021-03-28 NOTE — Progress Notes (Signed)
[place] for warfarin Indication: atrial fibrillation     No Known Allergies  Patient Measurements: Height: 6' (182.9 cm) Weight: 105.8 kg (233 lb 4 oz) IBW/kg (Calculated) : 77.6 Heparin Dosing Weight: 100 kg  Vital Signs: Temp: 98.1 F (36.7 C) (12/02 0400) Temp Source: Oral (12/02 0400) BP: 190/80 (12/02 0415) Pulse Rate: 59 (12/02 0415)  Labs: Recent Labs    03/26/21 0240 03/27/21 0233 03/28/21 0238  HGB 7.7* 7.7* 7.8*  HCT 26.1* 26.0* 25.6*  PLT 241 237 226  LABPROT 46.7* 34.2* 21.9*  INR 5.0* 3.4* 1.9*  CREATININE 2.56* 2.31* 1.98*    Estimated Creatinine Clearance: 44.3 mL/min (A) (by C-G formula based on SCr of 1.98 mg/dL (H)).   Medical History: Past Medical History:  Diagnosis Date   Aortic stenosis    moderate in 2022   Atrial fibrillation (HCC)    CHF (congestive heart failure) ([place])    Coronary artery disease    Diabetes mellitus without complication ([place])    HLD (hyperlipidemia)    Hypertension    Peripheral arterial disease ([place])     Medications:  PTA Warfarin 5mg  daily - LD 11/27 @ 1200  Assessment: 70 yr male with respiratory failure; + influenza A, possible pneumonia PMH significant for AFib and patient on warfarin PTA Warfarin resumed 11/28 with 4mg  dose  Extubated 11/29, passed swallow study, Carb mod diet  Today, 03/28/21  INR 1.9, slightly below therapeutic range  Warfarin held 11/30 & 12/1 with elevated INR DDI with meds: azithromycin  Elevated INR was due to decreased po intake & Azithromycin interaction Hgb low-stable, Plt wnl   Goal of Therapy:  Heparin level 0.3-0.7 units/ml Monitor platelets by anticoagulation protocol: Yes   Plan:  Warfarin 5mg  today at 10am Daily PT/INR Monitor CBC  Monitor for signs and symptoms of bleeding  Minda Ditto PharmD [facility] [phone] 03/28/2021, 6:59 AM

## 2021-03-28 NOTE — Progress Notes (Signed)
This RN went over discharge instructions and education with patient. Patient's Ivs removed and escorted safely to family members car

## 2021-03-28 NOTE — Discharge Instructions (Signed)
Warfarin resumed 12/2 with 5mg  dose at 10am, next dose due 12/3.  Minda Ditto PharmD 03/28/2021, 9:17 AM

## 2021-03-29 LAB — CULTURE, BLOOD (ROUTINE X 2)
Culture: NO GROWTH
Culture: NO GROWTH
Special Requests: ADEQUATE

## 2021-04-01 ENCOUNTER — Encounter (HOSPITAL_COMMUNITY): Payer: Medicare PPO

## 2021-04-01 ENCOUNTER — Other Ambulatory Visit (HOSPITAL_COMMUNITY): Payer: Medicare PPO

## 2021-04-01 ENCOUNTER — Ambulatory Visit: Payer: Medicare PPO

## 2021-04-01 NOTE — Assessment & Plan Note (Signed)
Currently rate controlled.  On amiodarone.  Continue.  On Coumadin.  INR subtherapeutic recommend outpatient follow-up with PCP.

## 2021-04-01 NOTE — Assessment & Plan Note (Signed)
Resolved. Received IV Lasix. Continue to hold.  Currently on room air.  Does not appear volume overloaded. Echo shows improved EF 50 to 55%.

## 2021-04-01 NOTE — Assessment & Plan Note (Signed)
Iron level in 20s in the past.  Continue supplementation.

## 2021-04-01 NOTE — Assessment & Plan Note (Signed)
Met SIRS criteria on admission with fever T39, tachypnea and tachycardia with respiratory and renal failure. Organ damage with AKI as well as respiratory failure. Infection from influenza. Currently sepsis physiology resolved.

## 2021-04-01 NOTE — Assessment & Plan Note (Signed)
At baseline serum creatinine around 1.1.  On presentation serum creatinine 1.5.  Worsened to 2.5.  Now improving. Likely combination of sepsis potentially causing ATN as well as renal etiology.  Patient received IV diuresis as since there was some concern for acute exacerbation of chronic systolic CHF. BUN worsening.  Likely combination of constipation as well as steroids. Avoid nephrotoxic medication. Renally dose medication. Ultrasound renal negative.

## 2021-04-01 NOTE — Assessment & Plan Note (Signed)
Presents with shortness of breath.   58% on room air on EMS arrival.  Initially on NRB.  Transition to BiPAP.  Failed.  Required intubation in the ER.  Admitted to the ICU.  Extubated. Secondary to combination of acute COPD exacerbation due to influenza ED as well as bacterial pneumonia. Currently on room air at rest.  Respiratory condition improving. Continue prednisone taper, Mucinex, DuoNebs, Tamiflu.  We will Continue short course of azithromycin.

## 2021-04-01 NOTE — Assessment & Plan Note (Signed)
Blood pressure stable.  Monitor. 

## 2021-04-01 NOTE — Assessment & Plan Note (Signed)
Baseline hemoglobin around 10.  On presentation hemoglobin 8.9.  Currently trended down to 7.7.  No evidence of acute bleeding.  Likely dilutional component here.   Repeat level remained stable.

## 2021-04-01 NOTE — Assessment & Plan Note (Signed)
On Plavix.  Continue.

## 2021-04-01 NOTE — Assessment & Plan Note (Signed)
Troponin elevated.  Peaked at 1200.  No chest pain.  No EKG changes to concern for acute ischemia.  No wall motion abnormality on echocardiogram, occasional PVC on telemetry. Continue Plavix, continue Coumadin, continue Coreg.

## 2021-04-01 NOTE — Assessment & Plan Note (Signed)
Hemoglobin A1c 6.9.  Blood sugar currently well controlled.  Treated with sliding scale insulin. Resume gabapentin.

## 2021-04-01 NOTE — Assessment & Plan Note (Signed)
Exacerbation.  Currently improving.  Continue current therapy.

## 2021-04-01 NOTE — Assessment & Plan Note (Signed)
Body mass index is 31.63 kg/m.  Placing the patient at high risk of poor outcome.  Monitor.

## 2021-04-01 NOTE — Assessment & Plan Note (Signed)
On Coumadin.  INR therapeutic.

## 2021-04-01 NOTE — Assessment & Plan Note (Signed)
Continue statin. 

## 2021-04-01 NOTE — Discharge Summary (Signed)
Physician Discharge Summary   Patient name: Rodney [name]  Admit date:     03/24/2021  Discharge date: 03/28/2021   Discharge Physician: Berle Mull   PCP: [name], MD   Recommendations at discharge: Follow-up with PCP as recommended.  Discharge Diagnoses Principal Problem:   Acute respiratory failure with hypoxia and hypercarbia (HCC) Active Problems:   Severe sepsis (HCC)   COPD (chronic obstructive pulmonary disease) (HCC)   Influenza A with pneumonia   Acute renal failure superimposed on stage 3a chronic kidney disease (HCC)   DM (diabetes mellitus), type 2 with neurological complications (HCC)   Moderate aortic stenosis   AF (paroxysmal atrial fibrillation) (HCC)   CAD (coronary artery disease)   Acute combined systolic and diastolic congestive heart failure (HCC)   Demand ischemia (HCC)   Supratherapeutic INR   History of DVT (deep vein thrombosis)   Essential hypertension   Anemia in chronic illness   Iron (Fe) deficiency anemia   Obesity   PVD (peripheral vascular disease) (HCC)   S/P BKA (below knee amputation) unilateral, left [facility])   [facility]   11/28 admitted for influenza A causing respiratory failure.  Intubated in ER. 11/29 extubated. 11/30 transferred to [facility] service.  Renal function worsening. 12/1 BUN still trending up,creat better, urine outpt less.    * Acute respiratory failure with hypoxia and hypercarbia (HCC) Presents with shortness of breath.   58% on room air on EMS arrival.  Initially on NRB.  Transition to BiPAP.  Failed.  Required intubation in the ER.  Admitted to the ICU.  Extubated. Secondary to combination of acute COPD exacerbation due to influenza ED as well as bacterial pneumonia. Currently on room air at rest.  Respiratory condition improving. Continue prednisone taper, Mucinex, DuoNebs, Tamiflu.  We will Continue short course of azithromycin.  Influenza A with pneumonia See acute  respiratory with hypoxia  COPD (chronic obstructive pulmonary disease) (HCC) Exacerbation.  Currently improving.  Continue current therapy.  Severe sepsis (HCC) Met SIRS criteria on admission with fever T39, tachypnea and tachycardia with respiratory and renal failure. Organ damage with AKI as well as respiratory failure. Infection from influenza. Currently sepsis physiology resolved.  Acute renal failure superimposed on stage 3a chronic kidney disease (HCC) At baseline serum creatinine around 1.1.  On presentation serum creatinine 1.5.  Worsened to 2.5.  Now improving. Likely combination of sepsis potentially causing ATN as well as renal etiology.  Patient received IV diuresis as since there was some concern for acute exacerbation of chronic systolic CHF. BUN worsening.  Likely combination of constipation as well as steroids. Avoid nephrotoxic medication. Renally dose medication. Ultrasound renal negative.  Demand ischemia (HCC) Troponin elevated.  Peaked at 1200.  No chest pain.  No EKG changes to concern for acute ischemia.  No wall motion abnormality on echocardiogram, occasional PVC on telemetry. Continue Plavix, continue Coumadin, continue Coreg.   Acute combined systolic and diastolic congestive heart failure (HCC) Resolved. Received IV Lasix. Continue to hold.  Currently on room air.  Does not appear volume overloaded. Echo shows improved EF 50 to 55%.  CAD (coronary artery disease) On Plavix.  Continue.   AF (paroxysmal atrial fibrillation) (HCC) Currently rate controlled.  On amiodarone.  Continue.  On Coumadin.  INR subtherapeutic recommend outpatient follow-up with PCP.  Moderate aortic stenosis Echocardiogram this admission mentions severe aortic stenosis but also mentions no significant change from prior echocardiogram done in July which showed moderate aortic stenosis. Currently  asymptomatic.  Outpatient follow-up with cardiology recommended.  DM (diabetes  mellitus), type 2 with neurological complications (HCC) Hemoglobin A1c 6.9.  Blood sugar currently well controlled.  Treated with sliding scale insulin. Resume gabapentin.   History of DVT (deep vein thrombosis) On Coumadin.  INR therapeutic.  Supratherapeutic INR Currently no evidence of bleeding.  Essential hypertension Blood pressure stable.  Monitor.  Anemia in chronic illness Baseline hemoglobin around 10.  On presentation hemoglobin 8.9.  Currently trended down to 7.7.  No evidence of acute bleeding.  Likely dilutional component here.   Repeat level remained stable.  Iron (Fe) deficiency anemia Iron level in 20s in the past.  Continue supplementation.  Obesity Body mass index is 31.63 kg/m.  Placing the patient at high risk of poor outcome.  Monitor.  S/P BKA (below knee amputation) unilateral, left ([place]) Uses wheelchair to get around.  PT recommends no therapy.  PVD (peripheral vascular disease) (HCC) On Plavix.  Continue.  Outpatient follow-up with vascular surgery as recommended.  Hypercholesteremia Continue statin.   Procedures performed: Intubation, echocardiogram  Condition at discharge: good  Exam General: Appear in mild distress, no Rash; Oral Mucosa clear. no Abnormal Neck Mass Or lumps, Conjunctiva normal  Cardiovascular: S1 and S2 Present, no Murmur Respiratory: increased respiratory effort, Bilateral Air entry present and no Crackles, bilateral expiratory wheezes Abdomen: Bowel Sound present, Soft and no tenderness Extremities: trace Pedal edema Neurology: alert and oriented to time, place, and person affect appropriate. no new focal deficit  Disposition: Home  Discharge time: greater than 30 minutes.  Follow-up Information     Hensel, Jamal Collin, MD. Schedule an appointment as soon as possible for a visit in 1 week(s).   Specialty: Family Medicine Why: with CBC and BMP Contact information: [place]  [address] [phone]         Geralynn Rile, MD. Schedule an appointment as soon as possible for a visit in 1 month(s).   Specialties: Cardiology, Internal Medicine, Radiology Contact information: [place] [address] [phone]                 Allergies as of 03/28/2021   No Known Allergies      Medication List     STOP taking these medications    spironolactone 25 MG tablet Commonly known as: ALDACTONE       TAKE these medications    acetaminophen 325 MG tablet Commonly known as: TYLENOL Take 1-2 tablets (325-650 mg total) by mouth every 6 (six) hours as needed for mild pain (pain score 1-3 or temp > 100.5). What changed: how much to take   albuterol 108 (90 Base) MCG/ACT inhaler Commonly known as: VENTOLIN HFA INHALE 2 PUFFS INTO THE LUNGS EVERY 6 (SIX) HOURS AS NEEDED FOR WHEEZING OR SHORTNESS OF BREATH.   amiodarone 200 MG tablet Commonly known as: PACERONE Take 1 tablet (200 mg total) by mouth daily.   atorvastatin 80 MG tablet Commonly known as: LIPITOR TAKE 1 TABLET EVERY DAY   benzonatate 100 MG capsule Commonly known as: TESSALON Take 1 capsule (100 mg total) by mouth 3 (three) times daily.   carvedilol 12.5 MG tablet Commonly known as: COREG Take 6.25 mg by mouth 2 (two) times daily.   clopidogrel 75 MG tablet Commonly known as: PLAVIX TAKE 1 TABLET EVERY DAY WITH BREAKFAST   dextromethorphan-guaiFENesin 30-600 MG 12hr tablet Commonly known as: MUCINEX DM Take 1 tablet by mouth 2 (two) times daily.  feeding supplement (NEPRO CARB STEADY) Liqd Take 237 mLs by mouth 2 (two) times daily between meals.   fluticasone 50 MCG/ACT nasal spray Commonly known as: FLONASE Place 2 sprays into both nostrils daily.   furosemide 40 MG tablet Commonly known as: LASIX Take 1 tablet (40 mg total) by mouth daily as needed for fluid (swelling).   gabapentin 100 MG capsule Commonly known as: NEURONTIN Take 2  capsules (200 mg total) by mouth 3 (three) times daily. What changed:  how much to take when to take this   metFORMIN 1000 MG tablet Commonly known as: GLUCOPHAGE Take 1 tablet (1,000 mg total) by mouth daily. Start taking on: April 07, 2021 What changed: These instructions start on April 07, 2021. If you are unsure what to do until then, ask your doctor or other care provider.   NovoLIN 70/30 ReliOn (70-30) 100 UNIT/ML injection Generic drug: insulin NPH-regular Human Inject 15-30 Units into the skin in the morning and at bedtime. Inject 30 units into the skin with breakfast and 15 units with supper   oxymetazoline 0.05 % nasal spray Commonly known as: AFRIN Place 1 spray into both nostrils 2 (two) times daily as needed for congestion.   polyethylene glycol 17 g packet Commonly known as: MIRALAX / GLYCOLAX Take 17 g by mouth daily as needed for moderate constipation.   ReliOn True Metrix Test Strips test strip Generic drug: glucose blood Use to test blood sugar three times per day.   True Metrix Meter Devi Use to test blood sugar three times daily.   True Metrix Meter w/Device Kit USE AS DIRECTED   TRUEplus Lancets 33G Misc Use to test blood sugar three times per day.   umeclidinium bromide 62.5 MCG/INH Aepb Commonly known as: INCRUSE ELLIPTA Inhale 1 puff into the lungs daily.   warfarin 5 MG tablet Commonly known as: COUMADIN Take as directed. If you are unsure how to take this medication, talk to your nurse or doctor. Original instructions: Take 7.36m (one and a half tablets) on Mondays, Wednesdays and Fridays; Take 562m(one tablet) on Tuesdays, Thursdays, Saturdays and Sundays. OR as directed by Warfarin Clinic What changed:  how much to take how to take this when to take this additional instructions        Filed Weights   03/25/21 0500 03/26/21 0500 03/27/21 0427  Weight: 105.9 kg 105.9 kg 105.8 kg    DG Abd 1 View  Result Date:  03/24/2021 CLINICAL DATA:  OG tube placement. EXAM: ABDOMEN - 1 VIEW COMPARISON:  None. FINDINGS: 0946 hours. The OG tube tip is positioned in the mid stomach with proximal side port below the GE junction. Gaseous distention of colon noted upper abdomen. IMPRESSION: OG tube tip is in the mid stomach. Electronically Signed   By: ErMisty Stanley.D.   On: 03/24/2021 10:09   [place]  Result Date: 03/26/2021 CLINICAL DATA:  Acute kidney injury EXAM: RENAL / URINARY TRACT ULTRASOUND COMPLETE COMPARISON:  CT abdomen/pelvis 08/10/2020, renal ultrasound 04/03/2020 FINDINGS: Right Kidney: Renal measurements: 12.8 cm x 6.0 cm x 6.8 cm = volume: 273 mL. Echogenicity within normal limits. No mass or hydronephrosis visualized. Left Kidney: Renal measurements: 13.7 cm x 6.6 cm x 7.5 cm = volume: 154 mL. Echogenicity within normal limits. No mass or hydronephrosis visualized. Bladder: Appears normal for degree of bladder distention. Both ureteral jets were identified. Other: None. IMPRESSION: Normal renal ultrasound. Electronically Signed   By: PeValetta Mole.D.   On: 03/26/2021  08:19   DG Chest Portable 1 View  Result Date: 03/24/2021 CLINICAL DATA:  Status post intubation. EXAM: PORTABLE CHEST 1 VIEW COMPARISON:  Earlier radiograph dated 03/24/2021. FINDINGS: Endotracheal tube with tip approximately 6 cm above the carina. Enteric tube extends below the diaphragm with side-port just distal to the GE junction and tip in the proximal stomach. Recommend further advancing of the tube by approximately 5 cm for optimal positioning. Diffuse interstitial densities and left lung base opacity appears similar to prior radiograph. No pneumothorax. Stable cardiomediastinal silhouette. No acute osseous pathology. IMPRESSION: 1. Endotracheal tube above the carina. 2. Enteric tube with tip in the proximal stomach. Recommend further advancing of the tube by approximately 5 cm for optimal positioning. 3. No interval change in pulmonary  densities. Electronically Signed   By: Anner Crete M.D.   On: 03/24/2021 03:25   DG Chest Portable 1 View  Result Date: 03/24/2021 CLINICAL DATA:  Respiratory distress. EXAM: PORTABLE CHEST 1 VIEW COMPARISON:  January 26, 2021 FINDINGS: Moderate severity areas of atelectasis and/or infiltrate are seen within the bilateral lung bases, left greater than right. This represents a new finding when compared to the prior study. Mildly increased interstitial lung markings are also noted. A small left pleural effusion is seen. This is also new when compared to the prior exam. No pneumothorax is identified. The heart size and mediastinal contours are within normal limits. There is moderate severity calcification of the aortic arch. The visualized skeletal structures are unremarkable. IMPRESSION: 1. Moderate severity bibasilar atelectasis and/or infiltrate, left greater than right. A mild, superimposed component of congestive heart failure cannot be excluded. 2. Small left pleural effusion. Electronically Signed   By: Virgina Norfolk M.D.   On: 03/24/2021 00:39   ECHOCARDIOGRAM LIMITED  Result Date: 03/24/2021    ECHOCARDIOGRAM LIMITED REPORT   Patient Name:   Rodney [name] Date of Exam: 03/24/2021 Medical Rec #:  [id number]         Height:       72.0 in Accession #:    [id number]        Weight:       235.9 lb Date of Birth:  [birth year]        BSA:          2.285 m Patient Age:    37 years          BP:           129/61 mmHg Patient Gender: M                 HR:           58 bpm. Exam Location:  Inpatient Procedure: 2D Echo, Cardiac Doppler, Color Doppler and Intracardiac            Opacification Agent Indications:    NSTEMI  History:        Patient has prior history of Echocardiogram examinations, most                 recent 10/30/2020. CAD, Arrythmias:Atrial Fibrillation; Risk                 Factors:Hypertension and Diabetes.  Sonographer:    Glo Herring Referring Phys: [id number] [place]  1. Left ventricular ejection fraction, by estimation, is 50 to 55%. The left ventricle has low normal function. The left ventricular internal cavity size was moderately dilated. There is mild left ventricular hypertrophy.  2. Right ventricular systolic function is normal.  The right ventricular size is normal.  3. Moderate mitral annular calcification.  4. AV is thickened, calcified with restricted motion Peak and mean gradients through the valve are 60 and 33 mm Hg respectively. AVA (VTI) 0.58 cm2. Dimensionless index is 0.18 . THis is consistent with severe AS> COmpared to echo from July 2022 there is no significant change . The aortic valve is abnormal. Aortic valve regurgitation is trivial.  5. The inferior vena cava is dilated in size with <50% respiratory variability, suggesting right atrial pressure of 15 mmHg. FINDINGS  Left Ventricle: Left ventricular ejection fraction, by estimation, is 50 to 55%. The left ventricle has low normal function. The left ventricular internal cavity size was moderately dilated. There is mild left ventricular hypertrophy. Right Ventricle: The right ventricular size is normal. Right vetricular wall thickness was not assessed. Right ventricular systolic function is normal. Left Atrium: Left atrial size was normal in size. Right Atrium: Right atrial size was normal in size. Pericardium: There is no evidence of pericardial effusion. Mitral Valve: There is mild thickening of the mitral valve leaflet(s). Moderate mitral annular calcification. MV peak gradient, 4.9 mmHg. The mean mitral valve gradient is 2.0 mmHg. Tricuspid Valve: The tricuspid valve is normal in structure. Tricuspid valve regurgitation is trivial. Aortic Valve: AV is thickened, calcified with restricted motion Peak and mean gradients through the valve are 60 and 33 mm Hg respectively. AVA (VTI) 0.58 cm2. Dimensionless index is 0.18 . THis is consistent with severe AS> COmpared to echo from July 2022 there  is no significant change. The aortic valve is abnormal. Aortic valve regurgitation is trivial. Aortic valve mean gradient measures 31.5 mmHg. Aortic valve peak gradient measures 58.8 mmHg. Aortic valve area, by VTI measures 0.57 cm. Pulmonic Valve: The pulmonic valve was not well visualized. Aorta: The aortic root is normal in size and structure. Venous: The inferior vena cava is dilated in size with less than 50% respiratory variability, suggesting right atrial pressure of 15 mmHg. LEFT VENTRICLE PLAX 2D LVIDd:         5.60 cm      Diastology LVIDs:         4.10 cm      LV e' medial:    6.20 cm/s LV PW:         1.20 cm      LV E/e' medial:  16.1 LV IVS:        1.20 cm      LV e' lateral:   5.77 cm/s LVOT diam:     2.00 cm      LV E/e' lateral: 17.3 LV SV:         55 LV SV Index:   24 LVOT Area:     3.14 cm  LV Volumes (MOD) LV vol d, MOD A2C: 160.0 ml LV vol d, MOD A4C: 124.0 ml LV vol s, MOD A2C: 89.1 ml LV vol s, MOD A4C: 69.2 ml LV SV MOD A2C:     70.9 ml LV SV MOD A4C:     124.0 ml LV SV MOD BP:      58.1 ml RIGHT VENTRICLE             IVC RV S prime:     11.50 cm/s  IVC diam: 3.30 cm LEFT ATRIUM             Index LA diam:        5.00 cm 2.19 cm/m LA Vol (A2C):   78.4 ml  34.30 ml/m LA Vol (A4C):   69.6 ml 30.45 ml/m LA Biplane Vol: 74.9 ml 32.77 ml/m  AORTIC VALVE                     PULMONIC VALVE AV Area (Vmax):    0.57 cm      PV Vmax:       0.91 m/s AV Area (Vmean):   0.60 cm      PV Peak grad:  3.3 mmHg AV Area (VTI):     0.57 cm AV Vmax:           383.50 cm/s AV Vmean:          260.000 cm/s AV VTI:            0.964 m AV Peak Grad:      58.8 mmHg AV Mean Grad:      31.5 mmHg LVOT Vmax:         69.20 cm/s LVOT Vmean:        49.900 cm/s LVOT VTI:          0.176 m LVOT/AV VTI ratio: 0.18  AORTA Ao Root diam: 3.40 cm Ao Asc diam:  3.50 cm MITRAL VALVE MV Area (PHT): 3.17 cm    SHUNTS MV Area VTI:   1.42 cm    Systemic VTI:  0.18 m MV Peak grad:  4.9 mmHg    Systemic Diam: 2.00 cm MV Mean grad:  2.0  mmHg MV Vmax:       1.11 m/s MV Vmean:      56.0 cm/s MV Decel Time: 239 msec MV E velocity: 99.80 cm/s MV A velocity: 47.10 cm/s MV E/A ratio:  2.12 Dorris Carnes MD Electronically signed by Dorris Carnes MD Signature Date/Time: 03/24/2021/3:47:40 PM    Final    Results for orders placed or performed during the hospital encounter of 03/24/21  Culture, blood (Routine x 2)     Status: None   Collection Time: 03/24/21 12:33 AM   Specimen: BLOOD  Result Value Ref Range Status   Specimen Description   Final    BLOOD BLOOD RIGHT FOREARM Performed at [facility], [place] [address], [place], [place] [address]    Special Requests   Final    BOTTLES DRAWN AEROBIC AND ANAEROBIC Blood Culture adequate volume Performed at [facility] [address], [place], [place] [address]    Culture   Final    NO GROWTH 5 DAYS Performed at [facility], [place] [address], [place], [place] [address]    Report Status 03/29/2021 FINAL  Final  Resp Panel by RT-PCR (Flu A&B, Covid) Nasopharyngeal Swab     Status: Abnormal   Collection Time: 03/24/21 12:35 AM   Specimen: Nasopharyngeal Swab; Nasopharyngeal(NP) swabs in vial transport medium  Result Value Ref Range Status   SARS Coronavirus 2 by RT PCR NEGATIVE NEGATIVE Final    Comment: (NOTE) SARS-CoV-2 target nucleic acids are NOT DETECTED.  The SARS-CoV-2 RNA is generally detectable in upper respiratory specimens during the acute phase of infection. The lowest concentration of SARS-CoV-2 viral copies this assay can detect is 138 copies/mL. A negative result does not preclude SARS-Cov-2 infection and should not be used as the sole basis for treatment or other patient management decisions. A negative result may occur with  improper specimen collection/handling, submission of specimen other than nasopharyngeal swab, presence of viral mutation(s) within the areas targeted by this assay, and inadequate number of  viral copies(<138 copies/mL). A negative result must be combined with clinical observations, patient history, and epidemiological information. The expected result is Negative.  Fact Sheet for Patients:  EntrepreneurPulse.com.au  Fact Sheet for Healthcare Providers:  IncredibleEmployment.be  This test is no t yet approved or cleared by the [place] FDA and  has been authorized for detection and/or diagnosis of SARS-CoV-2 by FDA under an Emergency Use Authorization (EUA). This EUA will remain  in effect (meaning this test can be used) for the duration of the COVID-19 declaration under Section 564(b)(1) of the Act, 21 U.S.C.section 360bbb-3(b)(1), unless the authorization is terminated  or revoked sooner.       Influenza A by PCR POSITIVE (A) NEGATIVE Final   Influenza B by PCR NEGATIVE NEGATIVE Final    Comment: (NOTE) The Xpert Xpress SARS-CoV-2/FLU/RSV plus assay is intended as an aid in the diagnosis of influenza from Nasopharyngeal swab specimens and should not be used as a sole basis for treatment. Nasal washings and aspirates are unacceptable for Xpert Xpress SARS-CoV-2/FLU/RSV testing.  Fact Sheet for Patients: EntrepreneurPulse.com.au  Fact Sheet for Healthcare Providers: IncredibleEmployment.be  This test is not yet approved or cleared by the [place] FDA and has been authorized for detection and/or diagnosis of SARS-CoV-2 by FDA under an Emergency Use Authorization (EUA). This EUA will remain in effect (meaning this test can be used) for the duration of the COVID-19 declaration under Section 564(b)(1) of the Act, 21 U.S.C. section 360bbb-3(b)(1), unless the authorization is terminated or revoked.  Performed at [facility], [place] [address] [place], [place] [address]   Culture, blood (Routine x 2)     Status: None   Collection Time: 03/24/21 12:36 AM   Specimen:  BLOOD  Result Value Ref Range Status   Specimen Description   Final    BLOOD RIGHT ANTECUBITAL Performed at [facility] [address], [place], [place] [address]    Special Requests   Final    BOTTLES DRAWN AEROBIC AND ANAEROBIC Blood Culture results may not be optimal due to an inadequate volume of blood received in culture bottles Performed at [facility] [address], [place], [place] [address]    Culture   Final    NO GROWTH 5 DAYS Performed at [facility], [place] [address], [place], [place] [address]    Report Status 03/29/2021 FINAL  Final  MRSA Next Gen by PCR, Nasal     Status: None   Collection Time: 03/24/21  7:03 AM   Specimen: Nasal Mucosa; Nasal Swab  Result Value Ref Range Status   MRSA by PCR Next Gen NOT DETECTED NOT DETECTED Final    Comment: (NOTE) The GeneXpert MRSA Assay (FDA approved for NASAL specimens only), is one component of a comprehensive MRSA colonization surveillance program. It is not intended to diagnose MRSA infection nor to guide or monitor treatment for MRSA infections. Test performance is not FDA approved in patients less than 20 years old. Performed at [facility], [place] [address] [place],  [address]    Recent Labs  Lab 03/25/21 [phone] 03/26/21 0240 03/27/21 0233 03/28/21 0238  WBC 12.8* 12.7* 12.6* 9.8  HGB 7.8* 7.7* 7.7* 7.8*  HCT 26.3* 26.1* 26.0* 25.6*  MCV 85.7 87.6 84.7 84.8  PLT 241 241 237 226   Recent Labs  Lab 03/25/21 0238 03/26/21 0240 03/27/21 0233 03/28/21 0238  NA 137 134* 135 134*  K 4.3 4.8 4.6 4.6  CL 104 103 104 103  CO2 21* _0 GLUCOSE 147* 188* 194* 216*  BUN 50* 66* 75* 78*  CREATININE 2.27* 2.56* 2.31* 1.98*  CALCIUM 8.5* 8.0* 8.1* 8.1*  MG 1.8 2.0  --   --   PHOS 4.1 4.8*  --   --    No results for input(s): AST, ALT, ALKPHOS, BILITOT, PROT, ALBUMIN in the last 168 hours. Recent Labs  Lab 03/27/21 0756  03/27/21 1124 03/27/21 1620 03/27/21 2109 03/28/21 0800  GLUCAP 146* 188* 258* 301* 185*    Author: Berle Mull [facility]

## 2021-04-01 NOTE — Assessment & Plan Note (Signed)
Echocardiogram this admission mentions severe aortic stenosis but also mentions no significant change from prior echocardiogram done in July which showed moderate aortic stenosis. Currently asymptomatic.  Outpatient follow-up with cardiology recommended.

## 2021-04-01 NOTE — Assessment & Plan Note (Signed)
Uses wheelchair to get around.  PT recommends no therapy.

## 2021-04-01 NOTE — Assessment & Plan Note (Signed)
Currently no evidence of bleeding.

## 2021-04-01 NOTE — Assessment & Plan Note (Signed)
On Plavix.  Continue.  Outpatient follow-up with vascular surgery as recommended.

## 2021-04-01 NOTE — Assessment & Plan Note (Addendum)
See acute respiratory with hypoxia

## 2021-04-17 ENCOUNTER — Telehealth: Payer: Self-pay

## 2021-04-17 NOTE — Telephone Encounter (Signed)
Received phone call from Aaron Edelman at [facility] regarding resumption of care orders. Requesting resumption of care for home health skilled nursing post hospitalization.   Provided verbal orders per protocol.   Talbot Grumbling, RN

## 2021-04-17 NOTE — Telephone Encounter (Signed)
Noted and agree. 

## 2021-04-23 ENCOUNTER — Other Ambulatory Visit: Payer: Self-pay | Admitting: Family Medicine

## 2021-04-23 DIAGNOSIS — J449 Chronic obstructive pulmonary disease, unspecified: Secondary | ICD-10-CM

## 2021-04-25 DIAGNOSIS — I872 Venous insufficiency (chronic) (peripheral): Secondary | ICD-10-CM | POA: Diagnosis not present

## 2021-04-25 DIAGNOSIS — E1122 Type 2 diabetes mellitus with diabetic chronic kidney disease: Secondary | ICD-10-CM | POA: Diagnosis not present

## 2021-04-25 DIAGNOSIS — E1151 Type 2 diabetes mellitus with diabetic peripheral angiopathy without gangrene: Secondary | ICD-10-CM | POA: Diagnosis not present

## 2021-04-25 DIAGNOSIS — D631 Anemia in chronic kidney disease: Secondary | ICD-10-CM | POA: Diagnosis not present

## 2021-04-25 DIAGNOSIS — L89511 Pressure ulcer of right ankle, stage 1: Secondary | ICD-10-CM | POA: Diagnosis not present

## 2021-04-25 DIAGNOSIS — Z4781 Encounter for orthopedic aftercare following surgical amputation: Secondary | ICD-10-CM | POA: Diagnosis not present

## 2021-04-25 DIAGNOSIS — N183 Chronic kidney disease, stage 3 unspecified: Secondary | ICD-10-CM | POA: Diagnosis not present

## 2021-04-25 DIAGNOSIS — I130 Hypertensive heart and chronic kidney disease with heart failure and stage 1 through stage 4 chronic kidney disease, or unspecified chronic kidney disease: Secondary | ICD-10-CM | POA: Diagnosis not present

## 2021-04-25 DIAGNOSIS — I5020 Unspecified systolic (congestive) heart failure: Secondary | ICD-10-CM | POA: Diagnosis not present

## 2021-05-01 ENCOUNTER — Ambulatory Visit (INDEPENDENT_AMBULATORY_CARE_PROVIDER_SITE_OTHER)
Admission: RE | Admit: 2021-05-01 | Discharge: 2021-05-01 | Disposition: A | Discharge status: Home / Self Care | Payer: Medicare PPO | Source: Ambulatory Visit | Attending: Physician Assistant | Admitting: Physician Assistant

## 2021-05-01 ENCOUNTER — Other Ambulatory Visit: Payer: Self-pay

## 2021-05-01 ENCOUNTER — Ambulatory Visit (HOSPITAL_COMMUNITY)
Admission: RE | Admit: 2021-05-01 | Discharge: 2021-05-01 | Disposition: A | Discharge status: Home / Self Care | Payer: Medicare PPO | Source: Ambulatory Visit | Attending: Vascular Surgery | Admitting: Vascular Surgery

## 2021-05-01 ENCOUNTER — Ambulatory Visit (INDEPENDENT_AMBULATORY_CARE_PROVIDER_SITE_OTHER): Payer: Medicare PPO | Admitting: Physician Assistant

## 2021-05-01 VITALS — BP 133/60 | HR 62 | Temp 97.6°F | Resp 20

## 2021-05-01 DIAGNOSIS — I739 Peripheral vascular disease, unspecified: Secondary | ICD-10-CM | POA: Diagnosis not present

## 2021-05-01 NOTE — Progress Notes (Signed)
[facility] HISTORY AND PHYSICAL   History of Present Illness:  This is a 71 y.o. male who is here today for follow up for PAD.  He had a wound on his right great toe status post right common iliac artery stent on 08/05/2020 by Dr. Carlis Abbott.  He then underwent right SFA to BK popliteal artery bypass with non reversed GSV on 08/07/2020 by Dr. Donzetta Matters.   On 11/14/2020, he underwent angioplasty and stent placement in the right SFA to BK popliteal bypass on 11/14/2020 by Dr. Carlis Abbott. He subsequently underwent right great toe amputation on 11/15/2020 by Dr. Sharol Given.   He presents today with right foot edema, wounds and blisters.  This has been going on for > 7 months.  He denise fever and chills.  He states he has been seen by his primary care and they have not change any medications.  He has used dry dressings daily and some elevation.     Contributing past medical history includes DM, CKD and PAD s/p right Bypass.  He has Afib and is on Coumadin, Lipitor and Plavix.  Past Medical History:  Diagnosis Date   Aortic stenosis    moderate in 2022   Atrial fibrillation (HCC)    CHF (congestive heart failure) ([place])    Coronary artery disease    Diabetes mellitus without complication (HCC)    HLD (hyperlipidemia)    Hypertension    Peripheral arterial disease ([place])     Past Surgical History:  Procedure Laterality Date   ABDOMINAL AORTOGRAM W/LOWER EXTREMITY N/A 08/05/2020   Procedure: ABDOMINAL AORTOGRAM W/LOWER EXTREMITY;  Surgeon: Marty Heck, MD;  Location: [place] [facility];  Service: Cardiovascular;  Laterality: N/A;   ABDOMINAL AORTOGRAM W/LOWER EXTREMITY N/A 11/13/2020   Procedure: ABDOMINAL AORTOGRAM W/LOWER EXTREMITY;  Surgeon: Cherre Robins, MD;  Location: [place] [facility];  Service: Cardiovascular;  Laterality: N/A;   AMPUTATION Left 09/28/2019   Procedure: AMPUTATION BELOW KNEE;  Surgeon: Newt Minion, MD;  Location: [place];  Service: Orthopedics;  Laterality:  Left;   AMPUTATION Right 11/15/2020   Procedure: RIGHT GREAT TOE AMPUTATION;  Surgeon: Newt Minion, MD;  Location: [place];  Service: Orthopedics;  Laterality: Right;   CARDIAC CATHETERIZATION     CARDIOVERSION N/A 10/05/2019   Procedure: CARDIOVERSION;  Surgeon: Sanda Klein, MD;  Location: [facility];  Service: Cardiovascular;  Laterality: N/A;   FEMORAL-POPLITEAL BYPASS GRAFT Right 08/07/2020   Procedure: RIGHT FEMORAL TO BELOW KNEE POPLITEAL ARTERY BYPASS;  Surgeon: Waynetta Sandy, MD;  Location: [place];  Service: Vascular;  Laterality: Right;   LEFT HEART CATH AND CORONARY ANGIOGRAPHY N/A 10/03/2019   Procedure: LEFT HEART CATH AND CORONARY ANGIOGRAPHY;  Surgeon: Lorretta Harp, MD;  Location: [place] [facility];  Service: Cardiovascular;  Laterality: N/A;   PERIPHERAL VASCULAR INTERVENTION Right 08/05/2020   Procedure: PERIPHERAL VASCULAR INTERVENTION;  Surgeon: Marty Heck, MD;  Location: [place] [facility];  Service: Cardiovascular;  Laterality: Right;  common Iliac   PERIPHERAL VASCULAR INTERVENTION Left 11/13/2020   Procedure: PERIPHERAL VASCULAR INTERVENTION;  Surgeon: Cherre Robins, MD;  Location: [place];  Service: Cardiovascular;  Laterality: Left;   PERIPHERAL VASCULAR INTERVENTION Right 11/14/2020   Procedure: PERIPHERAL VASCULAR INTERVENTION;  Surgeon: Marty Heck, MD;  Location: [place] [facility];  Service: Cardiovascular;  Laterality: Right;  POP/SFA STENT   TEE WITHOUT CARDIOVERSION N/A 10/05/2019   Procedure: TRANSESOPHAGEAL ECHOCARDIOGRAM (TEE);  Surgeon: Croitoru,  Mihai, MD;  Location: [facility];  Service: Cardiovascular;  Laterality: N/A;    ROS:   General:  No weight loss, Fever, chills  HEENT: No recent headaches, no nasal bleeding, no visual changes, no sore throat  Neurologic: No dizziness, blackouts, seizures. No recent symptoms of stroke or mini- stroke. No recent episodes of slurred speech, or temporary  blindness.  Cardiac: No recent episodes of chest pain/pressure, no shortness of breath at rest.  No shortness of breath with exertion.  Denies history of atrial fibrillation or irregular heartbeat  Vascular: No history of rest pain in feet.  No history of claudication.  + history of non-healing ulcer, No history of DVT   Pulmonary: No home oxygen, no productive cough, no hemoptysis,  No asthma or wheezing  Musculoskeletal:  _0  Arthritis, _1  Low back pain,  _2  Joint pain  Hematologic:No history of hypercoagulable state.  No history of easy bleeding.  No history of anemia  Gastrointestinal: No hematochezia or melena,  No gastroesophageal reflux, no trouble swallowing  Urinary: [ x] chronic Kidney disease, _3  on HD - _4  MWF or _5  TTHS, _6  Burning with urination, _7  Frequent urination, _8  Difficulty urinating;   Skin: No rashes  Psychological: No history of anxiety,  No history of depression  Social History Social History   Tobacco Use   Smoking status: Every Day    Packs/day: 0.50    Years: 50.00    Pack years: 25.00    Types: Cigarettes   Smokeless tobacco: Never  Vaping Use   Vaping Use: Never used  Substance Use Topics   Alcohol use: Yes    Alcohol/week: 6.0 standard drinks    Types: 6 Standard drinks or equivalent per week    Comment: 11/07/20 - states he has not drank in 6 months   Drug use: No    Family History Family History  Problem Relation Age of Onset   Alcoholism Mother    Alcoholism Father     Allergies  No Known Allergies   Current Outpatient Medications  Medication Sig Dispense Refill   acetaminophen (TYLENOL) 325 MG tablet Take 1-2 tablets (325-650 mg total) by mouth every 6 (six) hours as needed for mild pain (pain score 1-3 or temp > 100.5). (Patient taking differently: Take 650 mg by mouth every 6 (six) hours as needed for mild pain (pain score 1-3 or temp > 100.5).) 60 tablet 0   albuterol (VENTOLIN HFA) 108 (90 Base) MCG/ACT inhaler  INHALE 2 PUFFS INTO THE LUNGS EVERY 6 (SIX) HOURS AS NEEDED FOR WHEEZING OR SHORTNESS OF BREATH. 1 each 0   amiodarone (PACERONE) 200 MG tablet Take 1 tablet (200 mg total) by mouth daily. 90 tablet 3   atorvastatin (LIPITOR) 80 MG tablet TAKE 1 TABLET EVERY DAY (Patient taking differently: Take 80 mg by mouth daily.) 90 tablet 0   benzonatate (TESSALON) 100 MG capsule Take 1 capsule (100 mg total) by mouth 3 (three) times daily. 20 capsule 0   Blood Glucose Monitoring Suppl (TRUE METRIX METER) DEVI Use to test blood sugar three times daily. 1 each 0   Blood Glucose Monitoring Suppl (TRUE METRIX METER) w/Device KIT USE AS DIRECTED 1 kit 0   carvedilol (COREG) 12.5 MG tablet Take 6.25 mg by mouth 2 (two) times daily.     clopidogrel (PLAVIX) 75 MG tablet TAKE 1 TABLET EVERY DAY WITH BREAKFAST 90 tablet 3   dextromethorphan-guaiFENesin (MUCINEX DM) 30-600 MG 12hr  tablet Take 1 tablet by mouth 2 (two) times daily. 30 tablet 0   fluticasone (FLONASE) 50 MCG/ACT nasal spray Place 2 sprays into both nostrils daily. 16 g 6   furosemide (LASIX) 40 MG tablet Take 1 tablet (40 mg total) by mouth daily as needed for fluid (swelling). 30 tablet 1   gabapentin (NEURONTIN) 100 MG capsule Take 2 capsules (200 mg total) by mouth 3 (three) times daily. (Patient taking differently: Take 100 mg by mouth daily.) 30 capsule 0   glucose blood (RELION TRUE METRIX TEST STRIPS) test strip Use to test blood sugar three times per day. 300 each 3   insulin NPH-regular Human (NOVOLIN 70/30 RELION) (70-30) 100 UNIT/ML injection Inject 15-30 Units into the skin in the morning and at bedtime. Inject 30 units into the skin with breakfast and 15 units with supper     metFORMIN (GLUCOPHAGE) 1000 MG tablet Take 1 tablet (1,000 mg total) by mouth daily. 90 tablet 3   Nutritional Supplements (FEEDING SUPPLEMENT, NEPRO CARB STEADY,) LIQD Take 237 mLs by mouth 2 (two) times daily between meals.  0   oxymetazoline (AFRIN) 0.05 % nasal spray  Place 1 spray into both nostrils 2 (two) times daily as needed for congestion.     polyethylene glycol (MIRALAX / GLYCOLAX) 17 g packet Take 17 g by mouth daily as needed for moderate constipation. 14 each 0   TRUEplus Lancets 33G MISC Use to test blood sugar three times per day. 300 each 3   umeclidinium bromide (INCRUSE ELLIPTA) 62.5 MCG/INH AEPB Inhale 1 puff into the lungs daily. 1 each 0   warfarin (COUMADIN) 5 MG tablet Take 7.13m (one and a half tablets) on Mondays, Wednesdays and Fridays; Take 536m(one tablet) on Tuesdays, Thursdays, Saturdays and _0 2                1.03  biphasic             +--------+------------------+-----+--------+--------+   +--------+------------------+-----+--------+-------+  Left     Lt Pressure (mmHg) Index Waveform Comment   +--------+------------------+-----+--------+-------+   Brachial 158                                         +--------+------------------+-----+--------+-------+   +-------+-----------+-----------+------------+------------+   ABI/TBI Today's ABI Today's TBI Previous ABI Previous TBI   +-------+-----------+-----------+------------+------------+   Right   [place]          amputated   1.20         amputated      +-------+-----------+-----------+------------+------------+   Left    BKA         BKA         BKA          BKA            +-------+-----------+-----------+------------+------------+    Arterial wall calcification precludes accurate ankle pressures and ABIs.     Summary:  Right: Resting right ankle-brachial index indicates noncompressible right  lower extremity arteries.       Right Graft #1: Femoral-popliteal  +------------------+--------+--------+----------+--------------------------  ----+                      PSV cm/s Stenosis Waveform   Comments                           +------------------+--------+--------+----------+--------------------------  ----+   Inflow             144               biphasic                                      +------------------+--------+--------+----------+--------------------------  ----+   Prox Anastomosis   279               monophasic elevated velocity but                                                              significant disease not                                                            visualized                         +------------------+--------+--------+----------+--------------------------  ----+   Proximal Graft     118               monophasic                                    +------------------+--------+--------+----------+--------------------------   ----+   Mid Graft          61                monophasic                                    +------------------+--------+--------+----------+--------------------------  ----+  Distal Graft       79                monophasic                                    +------------------+--------+--------+----------+--------------------------  ----+   Distal Anastomosis 120               monophasic                                    +------------------+--------+--------+----------+--------------------------  ----+   Outflow            120               monophasic                                    +------------------+--------+--------+----------+--------------------------  ----+    Summary:  Right: Patent right femoral-BK popliteal bypass graft with no evidence of  stenosis. In-graft stents appear patent. Elevated velocities in the  proximal anastomosis, however, no significant disease was visualized.      Abdominal Aorta Findings:  +-------------+-------+----------+----------+--------+--------+------------  ---+   Location      AP (cm) Trans (cm) PSV (cm/s) Waveform Thrombus Comments            +-------------+-------+----------+----------+--------+--------+------------  ---+   Proximal                                                      Not  visualized    +-------------+-------+----------+----------+--------+--------+------------  ---+   Mid                                                           Not  visualized    +-------------+-------+----------+----------+--------+--------+------------  ---+   Distal                           109                                              +-------------+-------+----------+----------+--------+--------+------------  ---+   RT CIA Prox                      114                                              +-------------+-------+----------+----------+--------+--------+------------  ---+   RT CIA Mid                       108                                               +-------------+-------+----------+----------+--------+--------+------------  ---+  RT CIA Distal                    107                                              +-------------+-------+----------+----------+--------+--------+------------  ---+   RT EIA Prox                      157                                              +-------------+-------+----------+----------+--------+--------+------------  ---+   RT EIA Mid                       127                                              +-------------+-------+----------+----------+--------+--------+------------  ---+   RT EIA Distal                    127                                              +-------------+-------+----------+----------+--------+--------+------------  ---+   LT CIA Prox                                                   Not  visualized    +-------------+-------+----------+----------+--------+--------+------------  ---+   LT CIA Mid                                                    Not  visualized    +-------------+-------+----------+----------+--------+--------+------------  ---+   LT CIA Distal                                                 Not  visualized    +-------------+-------+----------+----------+--------+--------+------------  ---+   LT EIA Prox                                                   Not  visualized    +-------------+-------+----------+----------+--------+--------+------------  ---+   LT EIA Mid                       112                                              +-------------+-------+----------+----------+--------+--------+------------  ---+  LT EIA Distal                    85                                               +-------------+-------+----------+----------+--------+--------+------------  ---+   Limited visualization throughout the abdomen due to bowel gas and body  habitus.      Summary:  Abdominal Aorta: Abdominal  arteries appear patent with no evidence of  significant stenosis in the visualized segments. Right common iliac artery  stent walls were not able to be visualized, however, vessel appears  patent. This is based on a very limited  study due to body habitus and bowel gas.      ASSESSMENT/PLAN:  PAD  status post right common iliac artery stent on 08/05/2020 by Dr. Carlis Abbott.  He then underwent right SFA to BK popliteal artery bypass with non reversed GSV on 08/07/2020 by Dr. Donzetta Matters.   On 11/14/2020, he underwent angioplasty and stent placement in the right SFA to BK popliteal bypass on 11/14/2020 by Dr. Carlis Abbott. He subsequently underwent right great toe amputation on 11/15/2020 by Dr. Sharol Given.    His ABI's demonstrate biphasic inflow and the duplex has some stenosis at the inflow with EDV 279 monophasic flow distally.  He has new wounds with significant edema and blistering in the right foot that have produced new non healing wounds for 7 months now.     I will schedule him for angiogram and possible right LE intervention.  He has also been followed by DR. 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     [name] PA-C [facility] Office: [phone]  MD in clinic [place]

## 2021-05-06 ENCOUNTER — Inpatient Hospital Stay (HOSPITAL_COMMUNITY)
Admission: EM | Admit: 2021-05-06 | Discharge: 2021-05-16 | DRG: 252 | Disposition: A | Discharge status: Home / Self Care | Payer: Medicare PPO | Attending: Family Medicine | Admitting: Family Medicine

## 2021-05-06 ENCOUNTER — Other Ambulatory Visit: Payer: Self-pay

## 2021-05-06 ENCOUNTER — Emergency Department (HOSPITAL_COMMUNITY): Payer: Medicare PPO

## 2021-05-06 ENCOUNTER — Encounter (HOSPITAL_COMMUNITY): Payer: Self-pay

## 2021-05-06 DIAGNOSIS — Z7901 Long term (current) use of anticoagulants: Secondary | ICD-10-CM

## 2021-05-06 DIAGNOSIS — I350 Nonrheumatic aortic (valve) stenosis: Secondary | ICD-10-CM | POA: Diagnosis present

## 2021-05-06 DIAGNOSIS — E669 Obesity, unspecified: Secondary | ICD-10-CM | POA: Diagnosis present

## 2021-05-06 DIAGNOSIS — T82856A Stenosis of peripheral vascular stent, initial encounter: Secondary | ICD-10-CM | POA: Diagnosis not present

## 2021-05-06 DIAGNOSIS — E1122 Type 2 diabetes mellitus with diabetic chronic kidney disease: Secondary | ICD-10-CM | POA: Diagnosis present

## 2021-05-06 DIAGNOSIS — I509 Heart failure, unspecified: Secondary | ICD-10-CM | POA: Diagnosis not present

## 2021-05-06 DIAGNOSIS — E7800 Pure hypercholesterolemia, unspecified: Secondary | ICD-10-CM | POA: Diagnosis present

## 2021-05-06 DIAGNOSIS — I130 Hypertensive heart and chronic kidney disease with heart failure and stage 1 through stage 4 chronic kidney disease, or unspecified chronic kidney disease: Principal | ICD-10-CM | POA: Diagnosis present

## 2021-05-06 DIAGNOSIS — E11621 Type 2 diabetes mellitus with foot ulcer: Secondary | ICD-10-CM | POA: Diagnosis present

## 2021-05-06 DIAGNOSIS — Z79899 Other long term (current) drug therapy: Secondary | ICD-10-CM

## 2021-05-06 DIAGNOSIS — N1831 Chronic kidney disease, stage 3a: Secondary | ICD-10-CM | POA: Diagnosis present

## 2021-05-06 DIAGNOSIS — I5033 Acute on chronic diastolic (congestive) heart failure: Secondary | ICD-10-CM | POA: Diagnosis not present

## 2021-05-06 DIAGNOSIS — D631 Anemia in chronic kidney disease: Secondary | ICD-10-CM | POA: Diagnosis present

## 2021-05-06 DIAGNOSIS — T380X5A Adverse effect of glucocorticoids and synthetic analogues, initial encounter: Secondary | ICD-10-CM | POA: Diagnosis not present

## 2021-05-06 DIAGNOSIS — N179 Acute kidney failure, unspecified: Secondary | ICD-10-CM | POA: Diagnosis present

## 2021-05-06 DIAGNOSIS — E875 Hyperkalemia: Secondary | ICD-10-CM | POA: Diagnosis not present

## 2021-05-06 DIAGNOSIS — F1410 Cocaine abuse, uncomplicated: Secondary | ICD-10-CM | POA: Diagnosis present

## 2021-05-06 DIAGNOSIS — I248 Other forms of acute ischemic heart disease: Secondary | ICD-10-CM | POA: Diagnosis present

## 2021-05-06 DIAGNOSIS — R0902 Hypoxemia: Secondary | ICD-10-CM | POA: Diagnosis present

## 2021-05-06 DIAGNOSIS — L97519 Non-pressure chronic ulcer of other part of right foot with unspecified severity: Secondary | ICD-10-CM | POA: Diagnosis not present

## 2021-05-06 DIAGNOSIS — E1151 Type 2 diabetes mellitus with diabetic peripheral angiopathy without gangrene: Secondary | ICD-10-CM | POA: Diagnosis present

## 2021-05-06 DIAGNOSIS — I480 Paroxysmal atrial fibrillation: Secondary | ICD-10-CM | POA: Diagnosis present

## 2021-05-06 DIAGNOSIS — J90 Pleural effusion, not elsewhere classified: Secondary | ICD-10-CM | POA: Diagnosis not present

## 2021-05-06 DIAGNOSIS — E1142 Type 2 diabetes mellitus with diabetic polyneuropathy: Secondary | ICD-10-CM | POA: Diagnosis present

## 2021-05-06 DIAGNOSIS — D649 Anemia, unspecified: Secondary | ICD-10-CM | POA: Diagnosis not present

## 2021-05-06 DIAGNOSIS — I2510 Atherosclerotic heart disease of native coronary artery without angina pectoris: Secondary | ICD-10-CM | POA: Diagnosis present

## 2021-05-06 DIAGNOSIS — R0689 Other abnormalities of breathing: Secondary | ICD-10-CM | POA: Diagnosis not present

## 2021-05-06 DIAGNOSIS — I517 Cardiomegaly: Secondary | ICD-10-CM | POA: Diagnosis not present

## 2021-05-06 DIAGNOSIS — F1210 Cannabis abuse, uncomplicated: Secondary | ICD-10-CM | POA: Diagnosis present

## 2021-05-06 DIAGNOSIS — J449 Chronic obstructive pulmonary disease, unspecified: Secondary | ICD-10-CM | POA: Diagnosis not present

## 2021-05-06 DIAGNOSIS — Z20822 Contact with and (suspected) exposure to covid-19: Secondary | ICD-10-CM | POA: Diagnosis present

## 2021-05-06 DIAGNOSIS — Z6833 Body mass index (BMI) 33.0-33.9, adult: Secondary | ICD-10-CM

## 2021-05-06 DIAGNOSIS — R791 Abnormal coagulation profile: Secondary | ICD-10-CM | POA: Diagnosis not present

## 2021-05-06 DIAGNOSIS — Z95820 Peripheral vascular angioplasty status with implants and grafts: Secondary | ICD-10-CM | POA: Diagnosis not present

## 2021-05-06 DIAGNOSIS — E871 Hypo-osmolality and hyponatremia: Secondary | ICD-10-CM | POA: Diagnosis not present

## 2021-05-06 DIAGNOSIS — E1165 Type 2 diabetes mellitus with hyperglycemia: Secondary | ICD-10-CM | POA: Diagnosis present

## 2021-05-06 DIAGNOSIS — E440 Moderate protein-calorie malnutrition: Secondary | ICD-10-CM | POA: Diagnosis present

## 2021-05-06 DIAGNOSIS — I739 Peripheral vascular disease, unspecified: Secondary | ICD-10-CM | POA: Diagnosis not present

## 2021-05-06 DIAGNOSIS — Z794 Long term (current) use of insulin: Secondary | ICD-10-CM

## 2021-05-06 DIAGNOSIS — J9601 Acute respiratory failure with hypoxia: Secondary | ICD-10-CM | POA: Diagnosis present

## 2021-05-06 DIAGNOSIS — I70235 Atherosclerosis of native arteries of right leg with ulceration of other part of foot: Secondary | ICD-10-CM | POA: Diagnosis not present

## 2021-05-06 DIAGNOSIS — I5020 Unspecified systolic (congestive) heart failure: Secondary | ICD-10-CM

## 2021-05-06 DIAGNOSIS — E1149 Type 2 diabetes mellitus with other diabetic neurological complication: Secondary | ICD-10-CM

## 2021-05-06 DIAGNOSIS — J441 Chronic obstructive pulmonary disease with (acute) exacerbation: Secondary | ICD-10-CM | POA: Diagnosis present

## 2021-05-06 DIAGNOSIS — D638 Anemia in other chronic diseases classified elsewhere: Secondary | ICD-10-CM

## 2021-05-06 DIAGNOSIS — Z7902 Long term (current) use of antithrombotics/antiplatelets: Secondary | ICD-10-CM

## 2021-05-06 DIAGNOSIS — R0602 Shortness of breath: Secondary | ICD-10-CM | POA: Diagnosis not present

## 2021-05-06 DIAGNOSIS — E119 Type 2 diabetes mellitus without complications: Secondary | ICD-10-CM | POA: Diagnosis not present

## 2021-05-06 DIAGNOSIS — I878 Other specified disorders of veins: Secondary | ICD-10-CM | POA: Diagnosis present

## 2021-05-06 DIAGNOSIS — Z86718 Personal history of other venous thrombosis and embolism: Secondary | ICD-10-CM

## 2021-05-06 DIAGNOSIS — Z89512 Acquired absence of left leg below knee: Secondary | ICD-10-CM | POA: Diagnosis not present

## 2021-05-06 DIAGNOSIS — F17210 Nicotine dependence, cigarettes, uncomplicated: Secondary | ICD-10-CM | POA: Diagnosis present

## 2021-05-06 LAB — COMPREHENSIVE METABOLIC PANEL
ALT: 11 U/L (ref 0–44)
AST: 16 U/L (ref 15–41)
Albumin: 3 g/dL — ABNORMAL LOW (ref 3.5–5.0)
Alkaline Phosphatase: 74 U/L (ref 38–126)
Anion gap: 9 (ref 5–15)
BUN: 25 mg/dL — ABNORMAL HIGH (ref 8–23)
CO2: 29 mmol/L (ref 22–32)
Calcium: 8.5 mg/dL — ABNORMAL LOW (ref 8.9–10.3)
Chloride: 100 mmol/L (ref 98–111)
Creatinine, Ser: 1.31 mg/dL — ABNORMAL HIGH (ref 0.61–1.24)
GFR, Estimated: 59 mL/min — ABNORMAL LOW (ref >60)
Glucose, Bld: 202 mg/dL — ABNORMAL HIGH (ref 70–99)
Potassium: 4.4 mmol/L (ref 3.5–5.1)
Sodium: 138 mmol/L (ref 135–145)
Total Bilirubin: 0.7 mg/dL (ref 0.3–1.2)
Total Protein: 6.5 g/dL (ref 6.5–8.1)

## 2021-05-06 LAB — CBC WITH DIFFERENTIAL/PLATELET
Abs Immature Granulocytes: 0.08 10*3/uL — ABNORMAL HIGH (ref 0.00–0.07)
Basophils Absolute: 0.1 10*3/uL (ref 0.0–0.1)
Basophils Relative: 1 %
Eosinophils Absolute: 0.2 10*3/uL (ref 0.0–0.5)
Eosinophils Relative: 1 %
HCT: 26 % — ABNORMAL LOW (ref 39.0–52.0)
Hemoglobin: 7.1 g/dL — ABNORMAL LOW (ref 13.0–17.0)
Immature Granulocytes: 1 %
Lymphocytes Relative: 10 %
Lymphs Abs: 1.3 10*3/uL (ref 0.7–4.0)
MCH: 22.8 pg — ABNORMAL LOW (ref 26.0–34.0)
MCHC: 27.3 g/dL — ABNORMAL LOW (ref 30.0–36.0)
MCV: 83.6 fL (ref 80.0–100.0)
Monocytes Absolute: 0.9 10*3/uL (ref 0.1–1.0)
Monocytes Relative: 7 %
Neutro Abs: 10.6 10*3/uL — ABNORMAL HIGH (ref 1.7–7.7)
Neutrophils Relative %: 80 %
Platelets: 368 10*3/uL (ref 150–400)
RBC: 3.11 MIL/uL — ABNORMAL LOW (ref 4.22–5.81)
RDW: 16.3 % — ABNORMAL HIGH (ref 11.5–15.5)
WBC: 13.1 10*3/uL — ABNORMAL HIGH (ref 4.0–10.5)
nRBC: 0 % (ref 0.0–0.2)

## 2021-05-06 LAB — RESP PANEL BY RT-PCR (FLU A&B, COVID) ARPGX2
Influenza A by PCR: NEGATIVE
Influenza B by PCR: NEGATIVE
SARS Coronavirus 2 by RT PCR: NEGATIVE

## 2021-05-06 LAB — PROTIME-INR
INR: 1.8 — ABNORMAL HIGH (ref 0.8–1.2)
Prothrombin Time: 20.4 seconds — ABNORMAL HIGH (ref 11.4–15.2)

## 2021-05-06 LAB — BRAIN NATRIURETIC PEPTIDE: B Natriuretic Peptide: 801.7 pg/mL — ABNORMAL HIGH (ref 0.0–100.0)

## 2021-05-06 LAB — TROPONIN I (HIGH SENSITIVITY): Troponin I (High Sensitivity): 30 ng/L — ABNORMAL HIGH (ref <18)

## 2021-05-06 IMAGING — DX DG CHEST 1V PORT
1 series · 1 of 1 positions shown · non-contrast
Comparison: Chest CT [DATE] with contrast, portable chest
[DATE].

CLINICAL DATA: Shortness of breath.

EXAM:
PORTABLE CHEST 1 VIEW

[chest]
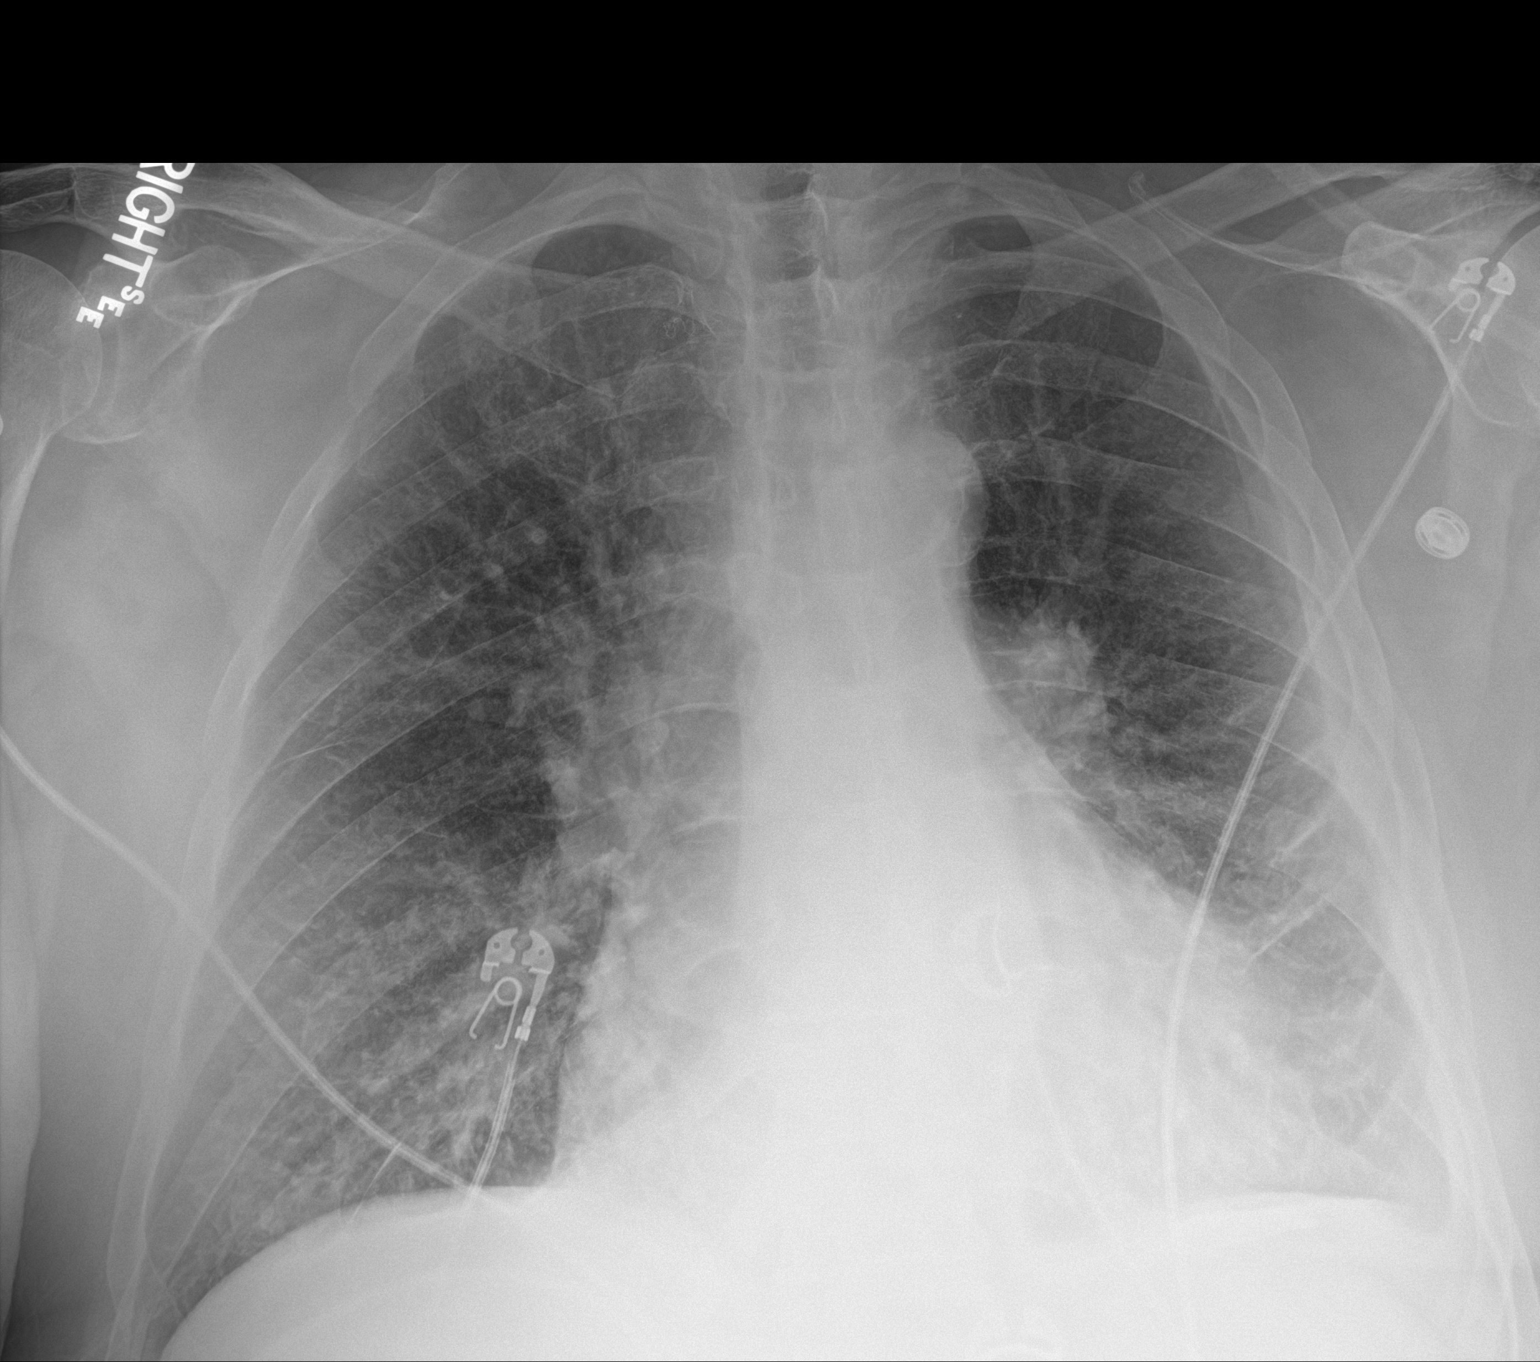

[1 of 1 positions shown; findings below may reference images not displayed]

FINDINGS: Interval extubation, removal NGT. The heart is enlarged. There is
mild perihilar vascular congestion without overt findings of edema.
Small layering left pleural effusion continues to be noted as well
as chronic left lower zonal pleural-parenchymal disease.

The lungs are clear of new infiltrates but chronic changes in the
left base could obscure an early developing infiltrate. No right
pleural collection is seen.

The mediastinum is stable with mild aortic tortuosity and
calcification of the arch. Osteopenia.
IMPRESSION: 1. Cardiomegaly with mild central vascular distension without overt
edema.
2. Chronic pleural-parenchymal disease in the left base and a small
left pleural effusion which could also be chronic. No new lung
opacity is seen but the chronic changes in the left lower zone could
obscure an early developing infiltrate.
3. Remaining lungs are clear.

## 2021-05-06 NOTE — H&P (Addendum)
[facility] Admission History and Physical Service Pager: [phone]  Patient name: Rodney [name] Medical record number: [id number] Date of birth: 07-Jun-1950 Age: 71 y.o. Gender: male  Primary Care Provider: [name], MD Consultants: None Code Status: Full Code   Preferred Emergency Contact: Rutherford Limerick Information     Name Relation Home Work Mobile   Stevens,[name] Significant other [phone]  [phone]       Chief Complaint: Shortness of breath  Assessment and Plan: Rodney [name] is a 71 yo male  presenting with shortness of breath. PMH is significant for HFpEF, hypertension, HLD, tobacco use, T2DM, protein calorie malnutrition, peripheral vascular disease, aortic stenosis, paroxysmal A. fib, s/p L BKA, CAD, CKD stage III, COPD  Acute Hypoxic Respiratory Distress Shortness of Breath Patient being admitted for shortness of breath and hypoxia at home. Patient was brought to ED from home via EMS and was cyanotic, with O2 sats to 70% and poor breath sounds. Received duoneb x 2, Magnesium 2 gm, solumedrol 125 mg, and Epi IM in EMS. In ED patient was put on BiPAP and O2 sats improved to 100%. Patient remained afebrile and otherwise vitals within normal limits. Respiratory panel was flu/covid negative. BNP was 801 (in past year has been around 200-400). CMP did not show any acute abnormalities in electrolytes. CBC showed anemia to 7.1 and WBC to 13.1. Troponins were 30 to 53 likely component of demand ischemia. CXR showed cardiomegaly with central vascular distention and small left pleural effusion with possible left lower lobe developing infiltrate. EKG with some PACs but otherwise normal sinus rhythm without ST elevation. On physical exam patient appeared edematous in right extremity about 3+ to knee, and bilateral wheezes in anterior lung fields while on BiPAP saturating at 100%. Differential includes likely heart failure  exacerbation given cardiomegaly and vascular distention on CXR with elevated BNP of 801. COPD exacerbation possibility as patient believes this is what he most feels like and has wheezing on examination however no increased sputum but has been having cough for about 2 months. CAP a possibility given chronic changes in left lower lobe could possibly mask a early developing infiltrate and elevated WBC to 13.1 although patient denies any fevers. Drug induced respiratory issues possibility as patient had positive cocaine and THC in UDS 10/22. PE unlikely at this time as patient not having any tachycardia, hemoptysis or chest pain and takes anticoagulation with warfarin. Anemia to 7.1 could be contributing to dyspnea as well however less likely given hypoxia. MI unlikely as patient denied any chest pain and EKG without any acute ST elevations although troponins trended from 30-53 likely component of demand ischemia.  Discussed with pharmacist on call about COPD treatment given patient was recently treated 12/2 while in the ICU.  Pharmacy recommended treating COPD with ceftriaxone and azithromycin, broadened to cover for Pseudomonas if he does not improve over the next 24 hours. Likely mixed picture of CHF exacerbation as well as COPD exacerbation given clinical findings.  Spoke with pharmacist on call and they recommended course of ceftriaxone 2 g daily and azithromycin 500 mg daily for 3 days. -Admit to progressive, attending Dr. Thompson Grayer -Vitals per floor protocol -Up with assistance -Diet NPO while on BiPAP -IV Lasix 40 mg x 1; re-dose as appropriate -IV Solu-Medrol 30 mg (1/11-1/12) -Azithromycin 500 mg daily (1/11-1/13) -Ceftriaxone 2 g daily (1/11- ) -O2 sat goal of 88-92% given COPD -AM CBC, CMP, PT/INR -PT/OT to evaluate and treat -Strict I's and  O's -Daily weights -Trend troponins  Anemia of Chronic Disease 7.1 on admission to the ED. Recent Hgb levels in the upper 7 range in the past few months, it  does not appear he received any blood for these hemoglobins.  Hemoglobin threshold of 8 given history of PVD/CAD.  Last iron panel 09/30/2019 showed decreased iron to 23 and ferritin of 168.  Likely chronic disease component.  Denies any hemoptysis, hematochezia, or any other bleeding.  Hemoglobin appears stable over the past few month so we will continue his anticoagulation which he takes for atrial fibrillation. -Transfusion threshold of 8 -1 unit PRBC ordered -Follow-up posttransfusion H&H -Monitor on CBC -Monitor fluid status after transfusion -We will continue his anticoagulation as I suspect this is chronic and have no concerns for acute bleeding as the cause of his anemia  HFpEF   moderate AS   elevated troponin Likely contributing to shortness of breath.  BNP of 801 with levels in the past year being 200s-500s.  Did have currently and central vascular distention on chest x-ray. Dry weight ~ 237 pounds per patient and weight in ED was 238 pounds.  Home medications of Lasix 40 mg which he takes as needed but usually daily, carvedilol 6.25 mg twice daily.  He did not take any medications this morning.  Last echo in May 2022 showed EF of 50 to 55% and grade II diastolic dysfunction.  On exam has significant edema 3+ to knee on right leg.  Troponin elevated 30 > 53.  No complaints of chest pain.  Likely demand ischemia. -Consider repeat echo -Monitor fluid status -Continue carvedilol -Holding IV Lasix 40 mg X1, will reassess and consider increasing the dose if needed for good urine output -Strict I's and O's -Daily weights -Trend troponin as above  COPD Denies any increase in sputum however has been coughing more for the past 2 months and feels like this is a COPD exacerbation.  Did have diffuse wheezes on examination and frontal lung fields.  Currently saturating 100% on BiPAP in the ED.  Home medication of albuterol inhaler and Incruse Ellipta 62.5 daily. -BiPAP for tonight given respiratory  status and desaturation when removing mask -Azithromycin 500 mg daily for 3 days in addition to ceftriaxone as discussed with pharmacy due to his recent ICU stay for respiratory distress -Consider broadening to cover for Pseudomonas if does not improve in the next 24 hours -Solu-Medrol 30 mg IV until can transition to oral 40 mg prednisone (got 1 dose in EMS of oral Solu-Medrol) -Continue Incruse Ellipta -Albuterol nebulizer as needed every 6 hours  PAF on Warfarin Rate controlled (heart rates are in 70s).  PT was 20.4, INR 1.8 subtherapeutic with goal around 3.  Patient was holding warfarin for 3 days for planned vascular aortogram on 1/12.  I expect vascular will want to postpone the procedure given the patient is on BiPAP 24 hours before hand but spoke with pharmacy and will plan his first dose of warfarin at 10 AM tomorrow to give [place] time to reach out to vascular.  Home medication of amiodarone 200 mg daily and warfarin 7.5 mg Monday Wednesday and Friday and 5 mg on Tuesdays Thursdays Saturdays and Sundays.  Noted the patient is anemic with hemoglobin of 7.1 but this appears to be similar to his hemoglobin from 1 month ago at 7.8 which it does not appear he received any blood from.  He complains of no signs of bleeding, no black stools or blood per rectum, so  we will continue his warfarin. -INR goal 2-3 -Continue warfarin per pharmacy-discussed with pharmacy and will plan first dose at 10 AM tomorrow to give [place] time to discuss with vascular. -Monitor hemoglobin -Continue home amiodarone -Cardiac monitoring -Monitor PT/INR daily  PVD   CAD   Right Foot Wounds S/p right common iliac artery stent 08/06/18/2022, right SFA to BK popliteal artery bypass with Non reversed GSV on 08/07/2020, angioplasty and stent placement 11/14/2020, SFA to BK popliteal bypass on 11/14/2020, and right great toe amputation on 11/15/2020.  Was seen in vascular office on 1/5 for right foot edema wounds and blisters that have  been ongoing for over 7 months and plan to get an aortogram 1/12 for this with possible right LE intervention. ABIs at that time showed biphasic inflow and the duplex had stenosis at the inflow with monophasic flow distally. Today he denies any fevers.  On exam patient's foot has some bullous lesions on the dorsal surface as well as some ulcers with clean bases around the toes.  Did not have any purulent drainage or infected looking ulcers. Also had significant venous stasis and 3+ edema on right leg.  Pulses were difficult to palpate on right leg given edema.  Home medication of Plavix 75 mg daily and atorvastatin 80 mg daily.   -Continue home medications -Wound care consult for right foot ulcers -Consider reaching out to vascular surgery about scheduled abdominal aortogram with lower extremity  HTN Blood pressure ranges have been 858I to 502D systolic over 74J to 28N diastolic.  Home medication of carvedilol 6.25 mg twice daily.  He did not take this medication today. -Continue home carvedilol -Monitor blood pressure  HLD Last lipid panel with total cholesterol of 122 and LDL 74 in April 2022. Home medication of atorvastatin 80 mg daily. -Continue home medication -Lipid panel  T2DM Last A1c of 6.9 on 03/24/2021. Home medication of 30 units of 70/30 during breakfast and 15 units 70/30 with dinner and 1000 mg metformin daily.  Spoke with pharmacist on-call about diabetic regimen and how to navigate given patient is on BiPAP and n.p.o. currently.  Advised sliding scale insulin as well as 5 units short acting (NovoLog) 3 times daily with meals initially and increase as needed from there.  Expect patient's glucoses to run high given the Solu-Medrol/prednisone we will dose for suspected COPD exacerbation. -sSSI -NovoLog 5 U 3 times daily with meals -CBG every 4 hours -When no longer n.p.o. we will switch to sliding scale insulin with meals  CKD Stage III Creatinine of 1.31 in ED with previous ones  in 2022 ranging more recently around baseline seems to be around 1.1-1.5 in July however has had some increased creatinines on prior admissions up to 2.6. -Monitor on BMP -Avoid nephrotoxic agents  Tobacco Use Smokes 4 to 5 cigarettes daily currently for over 40 years.   -Consider nicotine patch  Protein Calorie Malnutrition Albumin of 3.0. On Nepro twice daily at home. -Continue Nepro -Consider RD consult  Multiple chronic conditions/comorbidities Has had multiple admissions and recently ICU stay last month due to respiratory distress with him needing intubation.   -Can consider palliative consult  FEN/GI: Heart Healthy Prophylaxis: Warfarin  Disposition: Progressive  History of Present Illness:  Rodney [name] is a 71 y.o. male presenting with shortness of breath.  Patient thinks he is having a COPD exacerbation because he's felt like he needs to cough up stuff but hasn't been able to. He says it has been going on for  about two months. He says around 4-5pm he felt short of breath and called EMS. He does not use oxygen at home. He does not normally use a cpap or bipap at night. He thinks his dry weight is about 237lbs. He did not take any of his medicines today. He denies blood in stool, black stools, or bleeding anywhere.  Tobacco: 4-5 cigarettes per day, has smoked about 40 years  Alcohol: None anymore, not in 6-8 months Drugs: Marijuana on occasion   Review Of Systems: Per HPI with the following additions:   Review of Systems  Constitutional:  Negative for fever.  HENT:  Negative for congestion.   Eyes:  Negative for visual disturbance.  Respiratory:  Positive for shortness of breath and wheezing.   Cardiovascular:  Negative for chest pain.  Gastrointestinal:  Negative for abdominal pain and blood in stool.  Genitourinary:  Negative for dysuria.  Neurological:  Negative for headaches.  Psychiatric/Behavioral:  Negative for confusion.     Patient Active Problem List    Diagnosis Date Noted   Acute respiratory failure with hypoxia and hypercarbia ([place]) 03/26/2021   Acute renal failure superimposed on stage 3a chronic kidney disease ([place]) 03/26/2021   Demand ischemia ([place]) 03/26/2021   Iron (Fe) deficiency anemia 03/26/2021   Influenza A with pneumonia 03/24/2021   Hyperkalemia 12/10/2020   COPD (chronic obstructive pulmonary disease) ([place]) 12/09/2020   Acute combined systolic and diastolic congestive heart failure ([place])    Peripheral neuropathy 11/11/2020   Open wound of right great toe    Supratherapeutic INR 11/10/2020   History of DVT (deep vein thrombosis) 11/10/2020   Acute on chronic anemia    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   CAD (coronary artery disease) 02/15/2020   CKD stage 3 due to type 2 diabetes mellitus ([place]) 02/15/2020   Long term (current) use of anticoagulants 12/29/2019   S/P BKA (below knee amputation) unilateral, left ([place]) 11/14/2019   High risk social situation 11/14/2019   AF (paroxysmal atrial fibrillation) ([place]) 10/03/2019   Severe sepsis ([place]) 09/28/2019   HFrEF (heart failure with reduced ejection fraction) ([facility]) 09/28/2019   Moderate aortic stenosis 09/28/2019   Anemia in chronic illness 09/27/2019   Protein calorie malnutrition (HCC)    PVD (peripheral vascular disease) ([place])    Umbilical hernia 78/93/8101   Obesity 10/09/2015   Tobacco abuse 09/07/2013   DM (diabetes mellitus), type 2 with neurological complications ([place]) 75/01/2584   Hypercholesteremia 10/28/2010   ERECTILE DYSFUNCTION 05/22/2009   Essential hypertension 01/23/2009    Past Medical History: Past Medical History:  Diagnosis Date   Aortic stenosis    moderate in 2022   Atrial fibrillation (HCC)    CHF (congestive heart failure) ([place])    Coronary artery disease    Diabetes mellitus without complication ([place])    HLD (hyperlipidemia)    Hypertension    Peripheral arterial disease ([place])     Past  Surgical History: Past Surgical History:  Procedure Laterality Date   ABDOMINAL AORTOGRAM W/LOWER EXTREMITY N/A 08/05/2020   Procedure: ABDOMINAL AORTOGRAM W/LOWER EXTREMITY;  Surgeon: Marty Heck, MD;  Location: [facility];  Service: Cardiovascular;  Laterality: N/A;   ABDOMINAL AORTOGRAM W/LOWER EXTREMITY N/A 11/13/2020   Procedure: ABDOMINAL AORTOGRAM W/LOWER EXTREMITY;  Surgeon: Cherre Robins, MD;  Location: [facility];  Service: Cardiovascular;  Laterality: N/A;   AMPUTATION Left 09/28/2019   Procedure: AMPUTATION BELOW KNEE;  Surgeon: Newt Minion, MD;  Location: [facility];  Service: Orthopedics;  Laterality: Left;   AMPUTATION Right 11/15/2020   Procedure: RIGHT GREAT TOE AMPUTATION;  Surgeon: Newt Minion, MD;  Location: [facility];  Service: Orthopedics;  Laterality: Right;   CARDIAC CATHETERIZATION     CARDIOVERSION N/A 10/05/2019   Procedure: CARDIOVERSION;  Surgeon: Sanda Klein, MD;  Location: [facility];  Service: Cardiovascular;  Laterality: N/A;   FEMORAL-POPLITEAL BYPASS GRAFT Right 08/07/2020   Procedure: RIGHT FEMORAL TO BELOW KNEE POPLITEAL ARTERY BYPASS;  Surgeon: Waynetta Sandy, MD;  Location: [facility];  Service: Vascular;  Laterality: Right;   LEFT HEART CATH AND CORONARY ANGIOGRAPHY N/A 10/03/2019   Procedure: LEFT HEART CATH AND CORONARY ANGIOGRAPHY;  Surgeon: Lorretta Harp, MD;  Location: [facility];  Service: Cardiovascular;  Laterality: N/A;   PERIPHERAL VASCULAR INTERVENTION Right 08/05/2020   Procedure: PERIPHERAL VASCULAR INTERVENTION;  Surgeon: Marty Heck, MD;  Location: [facility];  Service: Cardiovascular;  Laterality: Right;  common Iliac   PERIPHERAL VASCULAR INTERVENTION Left 11/13/2020   Procedure: PERIPHERAL VASCULAR INTERVENTION;  Surgeon: Cherre Robins, MD;  Location: [facility];  Service: Cardiovascular;  Laterality: Left;   PERIPHERAL VASCULAR INTERVENTION Right 11/14/2020   Procedure:  PERIPHERAL VASCULAR INTERVENTION;  Surgeon: Marty Heck, MD;  Location: [place] [facility];  Service: Cardiovascular;  Laterality: Right;  POP/SFA STENT   TEE WITHOUT CARDIOVERSION N/A 10/05/2019   Procedure: TRANSESOPHAGEAL ECHOCARDIOGRAM (TEE);  Surgeon: Sanda Klein, MD;  Location: [facility];  Service: Cardiovascular;  Laterality: N/A;    Social History: Social History   Tobacco Use   Smoking status: Every Day    Packs/day: 0.50    Years: 50.00    Pack years: 25.00    Types: Cigarettes   Smokeless tobacco: Never  Vaping Use   Vaping Use: Never used  Substance Use Topics   Alcohol use: Yes    Alcohol/week: 6.0 standard drinks    Types: 6 Standard drinks or equivalent per week    Comment: 11/07/20 - states he has not drank in 6 months   Drug use: No   Additional social history:  Tobacco: 4-5 cigarettes per day, has smoked about 40 years  Alcohol: None anymore, not in 6-8 months Drugs: Marijuana on occasion  Please also refer to relevant sections of EMR.  Family History: Family History  Problem Relation Age of Onset   Alcoholism Mother    Alcoholism Father     Allergies and Medications: No Known Allergies No current facility-administered medications on file prior to encounter.   Current Outpatient Medications on File Prior to Encounter  Medication Sig Dispense Refill   acetaminophen (TYLENOL) 650 MG CR tablet Take 1,300 mg by mouth in the morning and at bedtime.     albuterol (VENTOLIN HFA) 108 (90 Base) MCG/ACT inhaler INHALE 2 PUFFS INTO THE LUNGS EVERY 6 (SIX) HOURS AS NEEDED FOR WHEEZING OR SHORTNESS OF BREATH. 1 each 0   amiodarone (PACERONE) 200 MG tablet Take 1 tablet (200 mg total) by mouth daily. 90 tablet 3   atorvastatin (LIPITOR) 80 MG tablet TAKE 1 TABLET EVERY DAY (Patient taking differently: Take 80 mg by mouth daily.) 90 tablet 0   benzonatate (TESSALON) 100 MG capsule Take 1 capsule (100 mg total) by mouth 3 (three) times daily. (Patient  not taking: Reported on 05/01/2021) 20 capsule 0   Blood Glucose Monitoring Suppl (TRUE METRIX METER) DEVI Use to test blood sugar three times daily. 1 each 0  Blood Glucose Monitoring Suppl (TRUE METRIX METER) w/Device KIT USE AS DIRECTED 1 kit 0   carvedilol (COREG) 12.5 MG tablet Take 6.25 mg by mouth 2 (two) times daily with a meal.     clopidogrel (PLAVIX) 75 MG tablet TAKE 1 TABLET EVERY DAY WITH BREAKFAST 90 tablet 3   dextromethorphan-guaiFENesin (MUCINEX DM) 30-600 MG 12hr tablet Take 1 tablet by mouth 2 (two) times daily. 30 tablet 0   fluticasone (FLONASE) 50 MCG/ACT nasal spray Place 2 sprays into both nostrils daily. (Patient taking differently: Place 2 sprays into both nostrils daily as needed for allergies.) 16 g 6   furosemide (LASIX) 40 MG tablet Take 1 tablet (40 mg total) by mouth daily as needed for fluid (swelling). (Patient taking differently: Take 40 mg by mouth daily.) 30 tablet 1   gabapentin (NEURONTIN) 300 MG capsule Take 600 mg by mouth daily.     glucose blood (RELION TRUE METRIX TEST STRIPS) test strip Use to test blood sugar three times per day. 300 each 3   insulin NPH-regular Human (NOVOLIN 70/30 RELION) (70-30) 100 UNIT/ML injection Inject 15-30 Units into the skin in the morning and at bedtime. Inject 30 units into the skin with breakfast and 15 units with supper     metFORMIN (GLUCOPHAGE) 1000 MG tablet Take 1 tablet (1,000 mg total) by mouth daily. 90 tablet 3   Nutritional Supplements (FEEDING SUPPLEMENT, NEPRO CARB STEADY,) LIQD Take 237 mLs by mouth 2 (two) times daily between meals.  0   phenylephrine (NEO-SYNEPHRINE) 1 % nasal spray Place 1 drop into both nostrils every 6 (six) hours as needed for congestion.     polyethylene glycol (MIRALAX / GLYCOLAX) 17 g packet Take 17 g by mouth daily as needed for moderate constipation. (Patient not taking: Reported on 05/01/2021) 14 each 0   TRUEplus Lancets 33G MISC Use to test blood sugar three times per day. 300 each 3    umeclidinium bromide (INCRUSE ELLIPTA) 62.5 MCG/INH AEPB Inhale 1 puff into the lungs daily. (Patient not taking: Reported on 05/01/2021) 1 each 0   warfarin (COUMADIN) 5 MG tablet Take 7.46m (one and a half tablets) on Mondays, Wednesdays and Fridays; Take 543m(one tablet) on Tuesdays, Thursdays, Saturdays and Sundays. OR as directed by Warfarin Clinic (Patient taking differently: Take 5-7.5 mg by mouth See admin instructions. 7.5 mg Mon Wed and Fri, 5 mg all other days) 100 tablet 3    Objective: BP (!) 156/76    Pulse 73    Resp 20    Ht 6' (1.829 m)    Wt 108 kg    SpO2 100%    BMI 32.28 kg/m  Exam: General: Laying in bed with mask on, no acute distress Eyes: tracks movements, no erythema ENTM: Mask on face, normocephalic atraumatic Neck: ROM intact Cardiovascular: irregular rhythm, rate controlled at 70s, no m/r/g, distant heart sounds  Respiratory: Wheezing diffusely in anterior lung fields bilaterally on BiPAP, saturating at 100%, speaking full sentences Gastrointestinal: Nondistended, nontender to palpation, soft, BS+ MSK: L BKA, R leg with 3+ edema to knee Derm: Chronic venous stasis on RLE shin, multiple wounds on right foot. See media below, No purulent drainage. Kerlix had some yellow strikethrough Neuro: A&Ox4, no focal deficits Psych: Appropriate       Labs and Imaging: CBC BMET  Recent Labs  Lab 05/06/21 1950  WBC 13.1*  HGB 7.1*  HCT 26.0*  PLT 368   Recent Labs  Lab 05/06/21 1950  NA 138  K 4.4  CL 100  CO2 29  BUN 25*  CREATININE 1.31*  GLUCOSE 202*  CALCIUM 8.5*     EKG:  some PACs but otherwise normal sinus rhythm without ST elevation.   DG Chest Portable 1 View  Result Date: 05/06/2021 CLINICAL DATA:  Shortness of breath. EXAM: PORTABLE CHEST 1 VIEW COMPARISON:  Chest CT 09/28/2019 with contrast, portable chest 03/24/2021. FINDINGS: Interval extubation, removal NGT. The heart is enlarged. There is mild perihilar vascular congestion without  overt findings of edema. Small layering left pleural effusion continues to be noted as well as chronic left lower zonal pleural-parenchymal disease. The lungs are clear of new infiltrates but chronic changes in the left base could obscure an early developing infiltrate. No right pleural collection is seen. The mediastinum is stable with mild aortic tortuosity and calcification of the arch. Osteopenia. IMPRESSION: 1. Cardiomegaly with mild central vascular distension without overt edema. 2. 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 No new lung opacity is seen but the chronic changes in the left lower zone could obscure an early developing infiltrate. 3. Remaining lungs are clear. Electronically Signed   By: Telford Nab M.D.   On: 05/06/2021 20:25     Gerrit Heck, MD 05/06/2021, 11:06 PM PGY-1, [place] Intern pager: [phone], text pages welcome   Upper Level Addendum:  I have seen and evaluated this patient along with Dr. Jinny Sanders and reviewed the above note, making necessary revisions as appropriate.  I agree with the medical decision making and physical exam as noted above.  Lurline Del, DO PGY-3 [facility]

## 2021-05-06 NOTE — ED Notes (Signed)
Ed provider at bedside

## 2021-05-06 NOTE — ED Notes (Signed)
Provided pt with water per request. Pt is asking when can he come off the bipap advised would need to speak with provider. Provider sent a message

## 2021-05-06 NOTE — ED Notes (Signed)
Bipap was removed from pt and pt was on [place]. Came to check on pt and he stated that he was having trouble breathing and that his o2 sats were dropping. Resp called. This rn placed pt back on bipap at previous settings. Resp entered the room and verified that everything was in the correct place. Pt back on bipap and states that is a lot better

## 2021-05-06 NOTE — ED Notes (Signed)
Resp at bedside to remove bipap at this time. Per provider we could test pt without the bipap at this time

## 2021-05-06 NOTE — ED Provider Notes (Signed)
[facility] EMERGENCY DEPARTMENT Provider Note   CSN: [id number] Arrival date & time: 05/06/21  1924     History  Chief Complaint  Patient presents with   Shortness of Breath    Rodney [name] is a 71 y.o. male.  HPI Patient presents with shortness of breath.  Cough.  Reported began acutely about an hour prior to arrival.  EMS called and reportedly had sats of 70%.  Given steroids magnesium albuterol and epinephrine.  States this is similar to previous episodes.  Does have swelling in his legs but states it is really not changed.    Home Medications Prior to Admission medications   Medication Sig Start Date End Date Taking? Authorizing Provider  acetaminophen (TYLENOL) 650 MG CR tablet Take 1,300 mg by mouth in the morning and at bedtime.    [provider]  albuterol (VENTOLIN HFA) 108 (90 Base) MCG/ACT inhaler INHALE 2 PUFFS INTO THE LUNGS EVERY 6 (SIX) HOURS AS NEEDED FOR WHEEZING OR SHORTNESS OF BREATH. 04/24/21   [name], MD  amiodarone (PACERONE) 200 MG tablet Take 1 tablet (200 mg total) by mouth daily. 10/14/20   [name], MD  atorvastatin (LIPITOR) 80 MG tablet TAKE 1 TABLET EVERY DAY Patient taking differently: Take 80 mg by mouth daily. 03/11/21   O'NealCassie Freer, MD  benzonatate (TESSALON) 100 MG capsule Take 1 capsule (100 mg total) by mouth 3 (three) times daily. Patient not taking: Reported on 05/01/2021 03/28/21   Lavina Hamman, MD  Blood Glucose Monitoring Suppl (TRUE METRIX METER) DEVI Use to test blood sugar three times daily. 11/20/19   [name], MD  Blood Glucose Monitoring Suppl (TRUE METRIX METER) w/Device KIT USE AS DIRECTED 03/25/20   [name], MD  carvedilol (COREG) 12.5 MG tablet Take 6.25 mg by mouth 2 (two) times daily with a meal. 12/18/20   [provider]  clopidogrel (PLAVIX) 75 MG tablet TAKE 1 TABLET EVERY DAY WITH BREAKFAST 03/17/21   [name], MD   dextromethorphan-guaiFENesin (MUCINEX DM) 30-600 MG 12hr tablet Take 1 tablet by mouth 2 (two) times daily. 03/28/21   Lavina Hamman, MD  fluticasone Asencion Islam) 50 MCG/ACT nasal spray Place 2 sprays into both nostrils daily. Patient taking differently: Place 2 sprays into both nostrils daily as needed for allergies. 02/13/21   Simmons-Robinson, Riki Sheer, MD  furosemide (LASIX) 40 MG tablet Take 1 tablet (40 mg total) by mouth daily as needed for fluid (swelling). Patient taking differently: Take 40 mg by mouth daily. 02/13/21   Simmons-Robinson, Makiera, MD  gabapentin (NEURONTIN) 300 MG capsule Take 600 mg by mouth daily.    [provider]  glucose blood (RELION TRUE METRIX TEST STRIPS) test strip Use to test blood sugar three times per day. 11/20/19   [name], MD  insulin NPH-regular Human (NOVOLIN 70/30 RELION) (70-30) 100 UNIT/ML injection Inject 15-30 Units into the skin in the morning and at bedtime. Inject 30 units into the skin with breakfast and 15 units with supper    [provider]  metFORMIN (GLUCOPHAGE) 1000 MG tablet Take 1 tablet (1,000 mg total) by mouth daily. 04/07/21   Lavina Hamman, MD  Nutritional Supplements (FEEDING SUPPLEMENT, NEPRO CARB STEADY,) LIQD Take 237 mLs by mouth 2 (two) times daily between meals. 11/22/20   Ezequiel Essex, MD  phenylephrine (NEO-SYNEPHRINE) 1 % nasal spray Place 1 drop into both nostrils every 6 (six) hours as needed for congestion.  [provider]  polyethylene glycol (MIRALAX / GLYCOLAX) 17 g packet Take 17 g by mouth daily as needed for moderate constipation. Patient not taking: Reported on 05/01/2021 03/28/21   Lavina Hamman, MD  TRUEplus Lancets 33G MISC Use to test blood sugar three times per day. 11/20/19   [name], MD  umeclidinium bromide (INCRUSE ELLIPTA) 62.5 MCG/INH AEPB Inhale 1 puff into the lungs daily. Patient not taking: Reported on 05/01/2021 12/09/20   [name], MD   warfarin (COUMADIN) 5 MG tablet Take 7.79m (one and a half tablets) on Mondays, Wednesdays and Fridays; Take 541m(one tablet) on Tuesdays, Thursdays, Saturdays and Sundays. OR as directed by Warfarin Clinic Patient taking differently: Take 5-7.5 mg by mouth See admin instructions. 7.5 mg Mon Wed and Fri, 5 mg all other days 12/11/20   HeZenia ResidesMD      Allergies    Patient has no known allergies.    Review of Systems   Review of Systems  Constitutional:  Negative for appetite change.  HENT:  Negative for congestion.   Respiratory:  Positive for shortness of breath and wheezing.   Cardiovascular:  Positive for leg swelling. Negative for chest pain.  Gastrointestinal:  Negative for abdominal pain.  Genitourinary:  Negative for flank pain.  Musculoskeletal:  Negative for back pain.  Skin:  Negative for rash.  Neurological:  Negative for weakness.   Physical Exam Updated Vital Signs BP (!) 156/76    Pulse 73    Resp 20    Ht 6' (1.829 m)    Wt 108 kg    SpO2 100%    BMI 32.28 kg/m  Physical Exam Pulmonary:     Breath sounds: Wheezing present. No rhonchi or rales.  Chest:     Chest wall: No deformity or tenderness.  Neurological:     Mental Status: He is alert.    ED Results / Procedures / Treatments   Labs (all labs ordered are listed, but only abnormal results are displayed) Labs Reviewed  COMPREHENSIVE METABOLIC PANEL - Abnormal; Notable for the following components:      Result Value   Glucose, Bld 202 (*)    BUN 25 (*)    Creatinine, Ser 1.31 (*)    Calcium 8.5 (*)    Albumin 3.0 (*)    GFR, Estimated 59 (*)    All other components within normal limits  BRAIN NATRIURETIC PEPTIDE - Abnormal; Notable for the following components:   B Natriuretic Peptide 801.7 (*)    All other components within normal limits  CBC WITH DIFFERENTIAL/PLATELET - Abnormal; Notable for the following components:   WBC 13.1 (*)    RBC 3.11 (*)    Hemoglobin 7.1 (*)    HCT 26.0 (*)     MCH 22.8 (*)    MCHC 27.3 (*)    RDW 16.3 (*)    Neutro Abs 10.6 (*)    Abs Immature Granulocytes 0.08 (*)    All other components within normal limits  PROTIME-INR - Abnormal; Notable for the following components:   Prothrombin Time 20.4 (*)    INR 1.8 (*)    All other components within normal limits  TROPONIN I (HIGH SENSITIVITY) - Abnormal; Notable for the following components:   Troponin I (High Sensitivity) 30 (*)    All other components within normal limits  RESP PANEL BY RT-PCR (FLU A&B, COVID) [id number]  TROPONIN I (HIGH SENSITIVITY)    EKG EKG Interpretation  Date/Time:  Tuesday May 06 2021 19:36:44 EST Ventricular Rate:  75 PR Interval:  172 QRS Duration: 111 QT Interval:  414 QTC Calculation: 463 R Axis:   47 Text Interpretation: Sinus rhythm Multiple ventricular premature complexes Abnormal R-wave progression, early transition Minimal ST depression, diffuse leads Confirmed by Davonna Belling [phone]) on 05/06/2021 7:41:31 PM  Radiology DG Chest Portable 1 View  Result Date: 05/06/2021 CLINICAL DATA:  Shortness of breath. EXAM: PORTABLE CHEST 1 VIEW COMPARISON:  Chest CT 09/28/2019 with contrast, portable chest 03/24/2021. FINDINGS: Interval extubation, removal NGT. The heart is enlarged. There is mild perihilar vascular congestion without overt findings of edema. Small layering left pleural effusion continues to be noted as well as chronic left lower zonal pleural-parenchymal disease. The lungs are clear of new infiltrates but chronic changes in the left base could obscure an early developing infiltrate. No right pleural collection is seen. The mediastinum is stable with mild aortic tortuosity and calcification of the arch. Osteopenia. IMPRESSION: 1. Cardiomegaly with mild central vascular distension without overt edema. 2. 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 No new lung opacity is seen but  the chronic changes in the left lower zone could obscure an early developing infiltrate. 3. Remaining lungs are clear. Electronically Signed   By: Telford Nab M.D.   On: 05/06/2021 20:25    Procedures Procedures    Medications Ordered in ED Medications - No data to display  ED Course/ Medical Decision Making/ A&P                           Medical Decision Making Amount and/or Complexity of Data Reviewed Independent Historian: EMS External Data Reviewed: notes. Labs: ordered. Decision-making details documented in ED Course. Radiology: independent interpretation performed. Decision-making details documented in ED Course. ECG/medicine tests: independent interpretation performed. Decision-making details documented in ED Course.  Risk Prescription drug management. Decision regarding hospitalization.   Patient presents with shortness of breath.  History of same.  Initial differential diagnosis is somewhat long and includes life-threatening conditions such as COPD exacerbation and CHF.  Also includes pneumonia.  Chest x-ray shows possible volume overload without frank edema.  Had hypoxia down to 70% at home.  Requiring BiPAP here.  Attempted to wean off BiPAP and did desat again.  Reviewed and interpreted blood work.  White count mildly elevated.  Mildly worsened anemia but does have chronic anemia.  Troponin mildly elevated but decreased from prior.  BNP elevated.  With continued hypoxia and oxygen requirement will admit to family practice.  CRITICAL CARE Performed by: Davonna Belling Total critical care time: 30 minutes Critical care time was exclusive of separately billable procedures and treating other patients. Critical care was necessary to treat or prevent imminent or life-threatening deterioration. Critical care was time spent personally by me on the following activities: development of treatment plan with patient and/or surrogate as well as nursing, discussions with consultants,  evaluation of patient's response to treatment, examination of patient, obtaining history from patient or surrogate, ordering and performing treatments and interventions, ordering and review of laboratory studies, ordering and review of radiographic studies, pulse oximetry and re-evaluation of patient's condition.         Final Clinical Impression(s) / ED Diagnoses Final diagnoses:  COPD exacerbation ([place])    Rx / DC Orders ED Discharge Orders     None  Davonna Belling, MD 05/06/21 [phone]

## 2021-05-06 NOTE — ED Triage Notes (Signed)
BIB [facility] from home pt was blue on their arrival with o2 sats 70%. With absent breath sounds. Pt received duoneb x2, Mag 2 gm, solumedrol 125mg , Epi IM, CPAP. Noted improvement with same

## 2021-05-06 NOTE — ED Notes (Signed)
Admit provider at bedside 

## 2021-05-06 NOTE — ED Notes (Signed)
Pt is asking for water advised would need to speak with provider. Provider sent a message at this time reference same

## 2021-05-06 NOTE — ED Notes (Signed)
Provider at bedside

## 2021-05-07 ENCOUNTER — Telehealth: Payer: Self-pay

## 2021-05-07 ENCOUNTER — Ambulatory Visit: Payer: Medicare PPO | Admitting: Family Medicine

## 2021-05-07 DIAGNOSIS — I739 Peripheral vascular disease, unspecified: Secondary | ICD-10-CM

## 2021-05-07 DIAGNOSIS — E1122 Type 2 diabetes mellitus with diabetic chronic kidney disease: Secondary | ICD-10-CM | POA: Diagnosis present

## 2021-05-07 DIAGNOSIS — L97519 Non-pressure chronic ulcer of other part of right foot with unspecified severity: Secondary | ICD-10-CM | POA: Diagnosis present

## 2021-05-07 DIAGNOSIS — E871 Hypo-osmolality and hyponatremia: Secondary | ICD-10-CM | POA: Diagnosis not present

## 2021-05-07 DIAGNOSIS — E7800 Pure hypercholesterolemia, unspecified: Secondary | ICD-10-CM | POA: Diagnosis present

## 2021-05-07 DIAGNOSIS — E119 Type 2 diabetes mellitus without complications: Secondary | ICD-10-CM | POA: Diagnosis not present

## 2021-05-07 DIAGNOSIS — Z20822 Contact with and (suspected) exposure to covid-19: Secondary | ICD-10-CM | POA: Diagnosis present

## 2021-05-07 DIAGNOSIS — D638 Anemia in other chronic diseases classified elsewhere: Secondary | ICD-10-CM | POA: Diagnosis not present

## 2021-05-07 DIAGNOSIS — I5033 Acute on chronic diastolic (congestive) heart failure: Secondary | ICD-10-CM

## 2021-05-07 DIAGNOSIS — E11621 Type 2 diabetes mellitus with foot ulcer: Secondary | ICD-10-CM | POA: Diagnosis present

## 2021-05-07 DIAGNOSIS — D631 Anemia in chronic kidney disease: Secondary | ICD-10-CM | POA: Diagnosis present

## 2021-05-07 DIAGNOSIS — I509 Heart failure, unspecified: Secondary | ICD-10-CM | POA: Diagnosis not present

## 2021-05-07 DIAGNOSIS — Z95820 Peripheral vascular angioplasty status with implants and grafts: Secondary | ICD-10-CM | POA: Diagnosis not present

## 2021-05-07 DIAGNOSIS — I248 Other forms of acute ischemic heart disease: Secondary | ICD-10-CM | POA: Diagnosis present

## 2021-05-07 DIAGNOSIS — D649 Anemia, unspecified: Secondary | ICD-10-CM | POA: Diagnosis not present

## 2021-05-07 DIAGNOSIS — N179 Acute kidney failure, unspecified: Secondary | ICD-10-CM | POA: Diagnosis present

## 2021-05-07 DIAGNOSIS — E669 Obesity, unspecified: Secondary | ICD-10-CM | POA: Diagnosis present

## 2021-05-07 DIAGNOSIS — I130 Hypertensive heart and chronic kidney disease with heart failure and stage 1 through stage 4 chronic kidney disease, or unspecified chronic kidney disease: Secondary | ICD-10-CM | POA: Diagnosis present

## 2021-05-07 DIAGNOSIS — E1151 Type 2 diabetes mellitus with diabetic peripheral angiopathy without gangrene: Secondary | ICD-10-CM | POA: Diagnosis present

## 2021-05-07 DIAGNOSIS — I350 Nonrheumatic aortic (valve) stenosis: Secondary | ICD-10-CM | POA: Diagnosis present

## 2021-05-07 DIAGNOSIS — J441 Chronic obstructive pulmonary disease with (acute) exacerbation: Secondary | ICD-10-CM

## 2021-05-07 DIAGNOSIS — J9601 Acute respiratory failure with hypoxia: Secondary | ICD-10-CM

## 2021-05-07 DIAGNOSIS — I2510 Atherosclerotic heart disease of native coronary artery without angina pectoris: Secondary | ICD-10-CM | POA: Diagnosis present

## 2021-05-07 DIAGNOSIS — E1142 Type 2 diabetes mellitus with diabetic polyneuropathy: Secondary | ICD-10-CM | POA: Diagnosis present

## 2021-05-07 DIAGNOSIS — E440 Moderate protein-calorie malnutrition: Secondary | ICD-10-CM | POA: Diagnosis present

## 2021-05-07 DIAGNOSIS — R0902 Hypoxemia: Secondary | ICD-10-CM | POA: Diagnosis not present

## 2021-05-07 DIAGNOSIS — T82856A Stenosis of peripheral vascular stent, initial encounter: Secondary | ICD-10-CM | POA: Diagnosis not present

## 2021-05-07 DIAGNOSIS — F17210 Nicotine dependence, cigarettes, uncomplicated: Secondary | ICD-10-CM | POA: Diagnosis present

## 2021-05-07 DIAGNOSIS — I70235 Atherosclerosis of native arteries of right leg with ulceration of other part of foot: Secondary | ICD-10-CM | POA: Diagnosis not present

## 2021-05-07 DIAGNOSIS — F1210 Cannabis abuse, uncomplicated: Secondary | ICD-10-CM | POA: Diagnosis present

## 2021-05-07 DIAGNOSIS — N1831 Chronic kidney disease, stage 3a: Secondary | ICD-10-CM | POA: Diagnosis present

## 2021-05-07 DIAGNOSIS — I480 Paroxysmal atrial fibrillation: Secondary | ICD-10-CM | POA: Diagnosis present

## 2021-05-07 DIAGNOSIS — Z89512 Acquired absence of left leg below knee: Secondary | ICD-10-CM | POA: Diagnosis not present

## 2021-05-07 LAB — BASIC METABOLIC PANEL
Anion gap: 9 (ref 5–15)
BUN: 24 mg/dL — ABNORMAL HIGH (ref 8–23)
CO2: 26 mmol/L (ref 22–32)
Calcium: 8.4 mg/dL — ABNORMAL LOW (ref 8.9–10.3)
Chloride: 104 mmol/L (ref 98–111)
Creatinine, Ser: 1.17 mg/dL (ref 0.61–1.24)
GFR, Estimated: >60 mL/min (ref >60)
Glucose, Bld: 204 mg/dL — ABNORMAL HIGH (ref 70–99)
Potassium: 4.4 mmol/L (ref 3.5–5.1)
Sodium: 139 mmol/L (ref 135–145)

## 2021-05-07 LAB — GLUCOSE, CAPILLARY
Glucose-Capillary: 214 mg/dL — ABNORMAL HIGH (ref 70–99)
Glucose-Capillary: 241 mg/dL — ABNORMAL HIGH (ref 70–99)
Glucose-Capillary: 243 mg/dL — ABNORMAL HIGH (ref 70–99)

## 2021-05-07 LAB — CBC
HCT: 24.8 % — ABNORMAL LOW (ref 39.0–52.0)
HCT: 25.5 % — ABNORMAL LOW (ref 39.0–52.0)
Hemoglobin: 6.9 g/dL — CL (ref 13.0–17.0)
Hemoglobin: 7.2 g/dL — ABNORMAL LOW (ref 13.0–17.0)
MCH: 23.4 pg — ABNORMAL LOW (ref 26.0–34.0)
MCH: 23.2 pg — ABNORMAL LOW (ref 26.0–34.0)
MCHC: 27.8 g/dL — ABNORMAL LOW (ref 30.0–36.0)
MCHC: 28.2 g/dL — ABNORMAL LOW (ref 30.0–36.0)
MCV: 84.1 fL (ref 80.0–100.0)
MCV: 82 fL (ref 80.0–100.0)
Platelets: 270 10*3/uL (ref 150–400)
Platelets: 258 10*3/uL (ref 150–400)
RBC: 2.95 MIL/uL — ABNORMAL LOW (ref 4.22–5.81)
RBC: 3.11 MIL/uL — ABNORMAL LOW (ref 4.22–5.81)
RDW: 16.3 % — ABNORMAL HIGH (ref 11.5–15.5)
RDW: 15.9 % — ABNORMAL HIGH (ref 11.5–15.5)
WBC: 9.2 10*3/uL (ref 4.0–10.5)
WBC: 6.1 10*3/uL (ref 4.0–10.5)
nRBC: 0 % (ref 0.0–0.2)
nRBC: 0 % (ref 0.0–0.2)

## 2021-05-07 LAB — PREPARE RBC (CROSSMATCH)

## 2021-05-07 LAB — TROPONIN I (HIGH SENSITIVITY)
Troponin I (High Sensitivity): 53 ng/L — ABNORMAL HIGH (ref <18)
Troponin I (High Sensitivity): 58 ng/L — ABNORMAL HIGH (ref <18)
Troponin I (High Sensitivity): 47 ng/L — ABNORMAL HIGH (ref <18)

## 2021-05-07 LAB — LIPID PANEL
Cholesterol: 94 mg/dL (ref 0–200)
HDL: 33 mg/dL — ABNORMAL LOW (ref >40)
LDL Cholesterol: 51 mg/dL (ref 0–99)
Total CHOL/HDL Ratio: 2.8 RATIO
Triglycerides: 48 mg/dL (ref <150)
VLDL: 10 mg/dL (ref 0–40)

## 2021-05-07 LAB — CBG MONITORING, ED
Glucose-Capillary: 180 mg/dL — ABNORMAL HIGH (ref 70–99)
Glucose-Capillary: 192 mg/dL — ABNORMAL HIGH (ref 70–99)
Glucose-Capillary: 209 mg/dL — ABNORMAL HIGH (ref 70–99)
Glucose-Capillary: 210 mg/dL — ABNORMAL HIGH (ref 70–99)

## 2021-05-07 LAB — PROTIME-INR
INR: 1.7 — ABNORMAL HIGH (ref 0.8–1.2)
Prothrombin Time: 19.5 seconds — ABNORMAL HIGH (ref 11.4–15.2)

## 2021-05-07 MED ORDER — SODIUM CHLORIDE 0.9% IV SOLUTION: Freq: Once | INTRAVENOUS | Status: AC

## 2021-05-07 MED ORDER — CLOPIDOGREL BISULFATE 75 MG PO TABS
75.0000 mg | ORAL_TABLET | Freq: Every day | ORAL | Status: DC
Start: 2021-05-07 — End: 2021-05-16
  Administered 2021-05-07 – 2021-05-16 (×10): 75 mg via ORAL
  Filled 2021-05-07 (×10): qty 1

## 2021-05-07 MED ORDER — SODIUM CHLORIDE 0.9% IV SOLUTION: Freq: Once | INTRAVENOUS | Status: DC

## 2021-05-07 MED ORDER — AMIODARONE HCL 200 MG PO TABS
200.0000 mg | ORAL_TABLET | Freq: Every day | ORAL | Status: DC
  Administered 2021-05-07 – 2021-05-16 (×9): 200 mg via ORAL
  Filled 2021-05-07 (×9): qty 1

## 2021-05-07 MED ORDER — FUROSEMIDE 10 MG/ML IJ SOLN
40.0000 mg | Freq: Once | INTRAMUSCULAR | Status: AC
  Administered 2021-05-07: 40 mg via INTRAVENOUS
  Filled 2021-05-07: qty 4

## 2021-05-07 MED ORDER — WARFARIN - PHARMACIST DOSING INPATIENT: Freq: Every day | Status: DC

## 2021-05-07 MED ORDER — ATORVASTATIN CALCIUM 80 MG PO TABS
80.0000 mg | ORAL_TABLET | Freq: Every day | ORAL | Status: DC
  Administered 2021-05-07 – 2021-05-16 (×9): 80 mg via ORAL
  Filled 2021-05-07 (×4): qty 1
  Filled 2021-05-07: qty 2
  Filled 2021-05-07 (×4): qty 1

## 2021-05-07 MED ORDER — NEPRO/CARBSTEADY PO LIQD
237.0000 mL | Freq: Two times a day (BID) | ORAL | Status: DC
  Administered 2021-05-07 – 2021-05-15 (×12): 237 mL via ORAL
  Filled 2021-05-07: qty 237

## 2021-05-07 MED ORDER — ACETAMINOPHEN 325 MG PO TABS
650.0000 mg | ORAL_TABLET | Freq: Four times a day (QID) | ORAL | Status: DC | PRN
  Administered 2021-05-07 – 2021-05-10 (×3): 650 mg via ORAL
  Filled 2021-05-07 (×3): qty 2

## 2021-05-07 MED ORDER — UMECLIDINIUM BROMIDE 62.5 MCG/ACT IN AEPB
1.0000 | INHALATION_SPRAY | Freq: Every day | RESPIRATORY_TRACT | Status: DC
  Administered 2021-05-09 – 2021-05-10 (×2): 1 via RESPIRATORY_TRACT
  Filled 2021-05-07 (×3): qty 7

## 2021-05-07 MED ORDER — WARFARIN SODIUM 10 MG PO TABS
10.0000 mg | ORAL_TABLET | Freq: Once | ORAL | Status: AC
  Administered 2021-05-07: 10 mg via ORAL
  Filled 2021-05-07: qty 1

## 2021-05-07 MED ORDER — ALBUTEROL SULFATE (2.5 MG/3ML) 0.083% IN NEBU
2.5000 mg | INHALATION_SOLUTION | Freq: Four times a day (QID) | RESPIRATORY_TRACT | Status: DC | PRN
  Administered 2021-05-07 – 2021-05-16 (×15): 2.5 mg via RESPIRATORY_TRACT
  Filled 2021-05-07 (×14): qty 3

## 2021-05-07 MED ORDER — WARFARIN SODIUM 10 MG PO TABS: 10.0000 mg | ORAL_TABLET | Freq: Once | ORAL | Status: DC

## 2021-05-07 MED ORDER — METHYLPREDNISOLONE SODIUM SUCC 40 MG IJ SOLR
30.0000 mg | Freq: Every day | INTRAMUSCULAR | Status: DC
  Administered 2021-05-07: 30 mg via INTRAVENOUS
  Filled 2021-05-07: qty 1

## 2021-05-07 MED ORDER — SODIUM CHLORIDE 0.9 % IV SOLN
2.0000 g | INTRAVENOUS | Status: AC
  Administered 2021-05-07 – 2021-05-08 (×2): 2 g via INTRAVENOUS
  Filled 2021-05-07 (×2): qty 20

## 2021-05-07 MED ORDER — AZITHROMYCIN 500 MG PO TABS
500.0000 mg | ORAL_TABLET | Freq: Every day | ORAL | Status: AC
  Administered 2021-05-07 – 2021-05-09 (×3): 500 mg via ORAL
  Filled 2021-05-07: qty 1
  Filled 2021-05-07: qty 2
  Filled 2021-05-07: qty 1

## 2021-05-07 MED ORDER — INSULIN ASPART 100 UNIT/ML IJ SOLN: 0.0000 [IU] | Freq: Three times a day (TID) | INTRAMUSCULAR | Status: DC

## 2021-05-07 MED ORDER — CARVEDILOL 6.25 MG PO TABS
6.2500 mg | ORAL_TABLET | Freq: Two times a day (BID) | ORAL | Status: DC
  Administered 2021-05-07 – 2021-05-16 (×18): 6.25 mg via ORAL
  Filled 2021-05-07 (×3): qty 1
  Filled 2021-05-07: qty 2
  Filled 2021-05-07 (×14): qty 1

## 2021-05-07 MED ORDER — HEPARIN (PORCINE) 25000 UT/250ML-% IV SOLN
1500.0000 [IU]/h | INTRAVENOUS | Status: AC
  Administered 2021-05-07 – 2021-05-09 (×3): 1500 [IU]/h via INTRAVENOUS
  Filled 2021-05-07 (×3): qty 250

## 2021-05-07 MED ORDER — SODIUM CHLORIDE 0.9 % IV SOLN: INTRAVENOUS | Status: DC | PRN

## 2021-05-07 MED ORDER — INSULIN ASPART 100 UNIT/ML IJ SOLN
0.0000 [IU] | INTRAMUSCULAR | Status: DC
  Administered 2021-05-07: 4 [IU] via SUBCUTANEOUS
  Administered 2021-05-07: 3 [IU] via SUBCUTANEOUS
  Administered 2021-05-07: 2 [IU] via SUBCUTANEOUS
  Administered 2021-05-07 (×3): 3 [IU] via SUBCUTANEOUS
  Administered 2021-05-08: 7 [IU] via SUBCUTANEOUS
  Administered 2021-05-08: 5 [IU] via SUBCUTANEOUS
  Administered 2021-05-08 (×2): 2 [IU] via SUBCUTANEOUS
  Administered 2021-05-08: 3 [IU] via SUBCUTANEOUS

## 2021-05-07 MED ORDER — INSULIN ASPART 100 UNIT/ML IJ SOLN
5.0000 [IU] | Freq: Three times a day (TID) | INTRAMUSCULAR | Status: DC
  Administered 2021-05-07 – 2021-05-16 (×27): 5 [IU] via SUBCUTANEOUS

## 2021-05-07 MED ORDER — GABAPENTIN 300 MG PO CAPS
600.0000 mg | ORAL_CAPSULE | Freq: Once | ORAL | Status: AC
  Administered 2021-05-07: 600 mg via ORAL
  Filled 2021-05-07: qty 2

## 2021-05-07 MED ORDER — SODIUM CHLORIDE 0.9 % IV SOLN: 2.0000 g | Freq: Once | INTRAVENOUS | Status: DC

## 2021-05-07 MED ORDER — POLYETHYLENE GLYCOL 3350 17 G PO PACK: 17.0000 g | PACK | Freq: Every day | ORAL | Status: DC | PRN

## 2021-05-07 MED ORDER — PREDNISONE 20 MG PO TABS
40.0000 mg | ORAL_TABLET | Freq: Every day | ORAL | Status: AC
  Administered 2021-05-08 – 2021-05-11 (×4): 40 mg via ORAL
  Filled 2021-05-07 (×4): qty 2

## 2021-05-07 MED ORDER — FLUTICASONE PROPIONATE 50 MCG/ACT NA SUSP
2.0000 | Freq: Every day | NASAL | Status: DC
  Administered 2021-05-09 – 2021-05-16 (×7): 2 via NASAL
  Filled 2021-05-07: qty 16

## 2021-05-07 MED ORDER — FUROSEMIDE 10 MG/ML IJ SOLN: 40.0000 mg | Freq: Once | INTRAMUSCULAR | Status: DC

## 2021-05-07 MED ORDER — GABAPENTIN 300 MG PO CAPS
600.0000 mg | ORAL_CAPSULE | Freq: Every day | ORAL | Status: DC
  Administered 2021-05-07 – 2021-05-16 (×9): 600 mg via ORAL
  Filled 2021-05-07 (×9): qty 2

## 2021-05-07 NOTE — Progress Notes (Signed)
Patient admitted to [phone] from ED via stretcher. Connected patient to telemetry monitor, skin assessment performed by this RN and Anabel Halon, RN. Patient's right foot elevated on multiple blankets per his request.Call bell placed within reach.

## 2021-05-07 NOTE — Progress Notes (Signed)
No bipap needed at this time. No Resp. Distress noted. Rt will monitor.

## 2021-05-07 NOTE — Progress Notes (Signed)
Inpatient Diabetes Program Recommendations  AACE/ADA: New Consensus Statement on Inpatient Glycemic Control (2015)  Target Ranges:  Prepandial:   less than 140 mg/dL      Peak postprandial:   less than 180 mg/dL (1-2 hours)      Critically ill patients:  140 - 180 mg/dL   Lab Results  Component Value Date   GLUCAP 210 (H) 05/07/2021   HGBA1C 6.9 (H) 03/24/2021    Review of Glycemic Control  Diabetes history: type 2 Outpatient Diabetes medications: 70/30 insulin 30 units at breakfast, 15 units at dinner, Metformin 1000 mg daily Current orders for Inpatient glycemic control: Novolog 0-9 units every 4 hours, Novolog 5 units TID  Inpatient Diabetes Program Recommendations:   Noted that patient is on basal insulin at home.  Recommend starting Semglee 10 units at HS and continue Novolog 0-9 units TID (if eating) and Novolog 5 units TID with meals since patient will be on steroids and if blood sugars continue to be greater than 180 mg/dl.   Will continue to monitor blood sugars while in the hospital.  Harvel Ricks RN BSN CDE Diabetes Coordinator Pager: [phone]  8am-5pm

## 2021-05-07 NOTE — Consult Note (Signed)
WOC Nurse Consult Note: Patient admitted for acute on chronic CHF and COPD exacerbation. 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  Hx PVD with necrotic tips of toes and erythema to right foot.  L BKA.  Reason for Consult: Right foot wounds.  Wound type: Vascular nonhealing Pressure Injury POA: NA Measurement: 1 cm x 3 cm  30% scabbed, lesion to right plantar foot at base of toes.  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 Other tips of toes are necrotic.   Wound UVO:ZDGUY, dry eschar to toes.  Plantar foot is 30% scabbed, 70% pale pink nongranulating Drainage (amount, consistency, odor) minimal serosanguinous to plantar foot wound Periwound:Erythema and edema to right dorsal foot noted. Dry skin Dressing procedure/placement/frequency:Cleanse right foot and toes with soap and water and pat dry.  Paint tips of toes with betadine each shift.  Apply calcium alginate (LAWSON # [id number]) to wound bed and cut into a strip to weave between toes.   Cover with dry dressing and tape.  Change daily.  Will not follow at this time.  Please re-consult if needed.  Domenic Moras MSN, RN, FNP-BC CWON Wound, Ostomy, Continence Nurse Pager [phone]

## 2021-05-07 NOTE — Progress Notes (Signed)
ANTICOAGULATION CONSULT NOTE  Pharmacy Consult for Warfarin Indication: atrial fibrillation  No Known Allergies  Patient Measurements: Height: 6' (182.9 cm) Weight: 108 kg (238 lb) IBW/kg (Calculated) : 77.6  Vital Signs: Temp: 98.5 F (36.9 C) (01/11 1522) Temp Source: Oral (01/11 1522) BP: 125/74 (01/11 1522) Pulse Rate: 65 (01/11 1522)  Labs: Recent Labs    05/06/21 1950 05/06/21 2319 05/07/21 0130 05/07/21 0210 05/07/21 0746 05/07/21 1100  HGB 7.1*  --  6.9*  --   --  7.2*  HCT 26.0*  --  24.8*  --   --  25.5*  PLT 368  --  270  --   --  258  LABPROT 20.4*  --   --  19.5*  --   --   INR 1.8*  --   --  1.7*  --   --   CREATININE 1.31*  --  1.17  --   --   --   TROPONINIHS 30* 53* 58*  --  47*  --     Estimated Creatinine Clearance: 75.7 mL/min (by C-G formula based on SCr of 1.17 mg/dL).  Assessment: 6 YOM with medical history relevant for atrial fibrillation on warfarin PTA who presented with SOB. Pharmacy consulted to manage warfarin while patient is admitted.  Per [facility] note from 02/13/2021: patient takes warfarin 7.5 mg on M/F and 5 mg all other days.   Pt has been holding warfarin for the past 3 days (last dose 01/08) due to upcoming planned angiogram and possible right LE intervention with [facility]. INR today is subtherapeutic at 1.7. Patient received warfarin 10 mg PO 01/11 at 0200. Currently continuing to hold pending confirmation from [facility].   Goal of Therapy:  INR 2-3 Monitor platelets by anticoagulation protocol: Yes   Plan:  Hold warfarin dose today Check INR daily while on warfarin Continue to monitor H&H and platelets    Thank you for allowing pharmacy to be a part of this patients care.  Ardyth Harps, PharmD Clinical Pharmacist

## 2021-05-07 NOTE — Progress Notes (Signed)
Progress note, Vein and Vascular Surgery Call  Spoke on the phone with Dr. Monica Martinez about patient Rodney [name]. Dr. Ainsley Spinner team had canceled patient's procedure, scheduled for 05/08/21, because of his hospital admission. Spoke with Dr. Carlis Abbott about patient's anticoagulation. Prior to admission, patient was taking both plavix and warfarin. Plavix was for a graft he has, and wafarin was for his A. Fib. Dr. Carlis Abbott recommended that he continue on the Plavix.   Patient had not been taking warfarin in anticipation of their procedure. With Dr. Carlis Abbott, it was determined that if patient rapidly improves during this admission, it's possible for him to have his procedure later this week or early next, whenever [facility] has availability. Considering this, it was recommended that patient be transitioned to heparin while admitted, so that he could more readily have his surgery when he was healthy enough.   Kennedy PGY-1

## 2021-05-07 NOTE — ED Notes (Signed)
O2 decreased to 2LPM Rodney [name]. Pt tolerating well. O2 at 95-97% on the monitor.

## 2021-05-07 NOTE — Progress Notes (Addendum)
Went to round on patient at the start of night shift.  Patient with no complaints at this time.  States his breathing has improved.  BP 137/68    Pulse 73    Temp 98.2 F (36.8 C) (Oral)    Resp 15    Ht 6' (1.829 m)    Wt 108 kg    SpO2 100%    BMI 32.28 kg/m  General: Alert and oriented in no apparent distress, nasal cannula in place Heart: Regular sounding rhythm with regular rate Lungs: Crackles and wheezing present greater on the left than the right, nasal cannula in place Extremities: LLE status post BKA, right lower extremity with open wounds similar to when we admitted him last night.  A/P 71 year old male admitted with shortness of breath thought to be CHF versus COPD exacerbation.  He status post 1 dose of Lasix 40 mg IV.  He was also noted to be anemic with a transfusion threshold of 8.0, hemoglobin of 7.2 after 1 unit packed RBCs.  We have ordered another unit to try to get him to his transfusion threshold.  He denies any signs of bleeding.  Denies any abdominal pain.  We will also dose him an additional dose of Lasix tomorrow morning as we will be given him fluids with the transfusion.  We will continue to monitor his vitals respiratory status.  Remainder per daytime progress note.  Addendum: His nurse reached out to me saying he was complaining of some foot pain and requested Tylenol as well as gabapentin.  Placed the order for Tylenol.  Per the [facility] it looks like his nurse this morning listed that he received it and then after that listed that it was not given.  Patient states he has not received it and his nurse tonight states that is what she was told as well that he had not received it.  We will place a one-time dose of gabapentin since it appears he did not receive it this morning.  He has normal renal function and even if he received both doses he would be under the maximum dosage.

## 2021-05-07 NOTE — Telephone Encounter (Signed)
-----   Message from Marty Heck, MD sent at 05/07/2021  1:44 PM EST ----- Regarding: RE: Please advise-scheduling Yes agree.  If he has SOB unlikely he can lay flat until optimized from CHF and COPD standpoint.  Thanks,  Gerald Stabs ----- Message ----- From: Nicholas Lose, RN Sent: 05/07/2021  11:42 AM EST To: Marty Heck, MD Subject: Please advise-scheduling                       Dr. Carlis Abbott,   I received a call from patient's family that he's at [facility] emergency room for shortness of breath. Patient admitted for acute on chronic CHF and COPD exacerbation. He is scheduled for an arteriogram on tomorrow and his wife inquired will it still proceed. I advised, the procedure would most likely be postponed until his condition improves, but would verify with you.  Your thoughts?  Herma Ard

## 2021-05-07 NOTE — Progress Notes (Signed)
ANTICOAGULATION CONSULT NOTE - Initial Consult  Pharmacy Consult for heparin Indication: atrial fibrillation  No Known Allergies  Patient Measurements: Height: 6' (182.9 cm) Weight: 108 kg (238 lb) IBW/kg (Calculated) : 77.6 Heparin Dosing Weight: 100kg  Vital Signs: Temp: 97.7 F (36.5 C) (01/11 1600) Temp Source: Oral (01/11 1600) BP: 125/74 (01/11 1522) Pulse Rate: 65 (01/11 1522)  Labs: Recent Labs    05/06/21 1950 05/06/21 2319 05/07/21 0130 05/07/21 0210 05/07/21 0746 05/07/21 1100  HGB 7.1*  --  6.9*  --   --  7.2*  HCT 26.0*  --  24.8*  --   --  25.5*  PLT 368  --  270  --   --  258  LABPROT 20.4*  --   --  19.5*  --   --   INR 1.8*  --   --  1.7*  --   --   CREATININE 1.31*  --  1.17  --   --   --   TROPONINIHS 30* 53* 58*  --  47*  --     Estimated Creatinine Clearance: 75.7 mL/min (by C-G formula based on SCr of 1.17 mg/dL).   Medical History: Past Medical History:  Diagnosis Date   Aortic stenosis    moderate in 2022   Atrial fibrillation (HCC)    CHF (congestive heart failure) (HCC)    Coronary artery disease    Diabetes mellitus without complication (HCC)    HLD (hyperlipidemia)    Hypertension    Peripheral arterial disease (HCC)     Medications:  Medications Prior to Admission  Medication Sig Dispense Refill Last Dose   albuterol (VENTOLIN HFA) 108 (90 Base) MCG/ACT inhaler INHALE 2 PUFFS INTO THE LUNGS EVERY 6 (SIX) HOURS AS NEEDED FOR WHEEZING OR SHORTNESS OF BREATH. 1 each 0 unk   atorvastatin (LIPITOR) 80 MG tablet TAKE 1 TABLET EVERY DAY (Patient taking differently: Take 80 mg by mouth daily.) 90 tablet 0 05/05/2021   dextromethorphan-guaiFENesin (MUCINEX DM) 30-600 MG 12hr tablet Take 1 tablet by mouth 2 (two) times daily. 30 tablet 0 Past Month   fluticasone (FLONASE) 50 MCG/ACT nasal spray Place 2 sprays into both nostrils daily. (Patient taking differently: Place 2 sprays into both nostrils daily as needed for allergies.) 16 g 6 Past  Month   furosemide (LASIX) 40 MG tablet Take 1 tablet (40 mg total) by mouth daily as needed for fluid (swelling). (Patient taking differently: Take 40 mg by mouth daily.) 30 tablet 1 05/05/2021   insulin NPH-regular Human (NOVOLIN 70/30 RELION) (70-30) 100 UNIT/ML injection Inject 15-30 Units into the skin See admin instructions. Inject 30 units into the skin with breakfast and 15 units with supper   05/05/2021   lactose free nutrition (BOOST) LIQD Take 237 mLs by mouth daily.   05/05/2021   metFORMIN (GLUCOPHAGE) 1000 MG tablet Take 1 tablet (1,000 mg total) by mouth daily. 90 tablet 3 05/05/2021   warfarin (COUMADIN) 5 MG tablet Take 7.78m (one and a half tablets) on Mondays, Wednesdays and Fridays; Take 533m(one tablet) on Tuesdays, Thursdays, Saturdays and Sundays. OR as directed by Warfarin Clinic (Patient taking differently: Take 5-7.5 mg by mouth See admin instructions. 7.5 mg Monday,Wednesday and friday  5 mg Tuesday,Thursday,Saturday and sunday) 100 tablet 3 05/04/2021   acetaminophen (TYLENOL) 650 MG CR tablet Take 1,300 mg by mouth in the morning and at bedtime.   05/05/2021   amiodarone (PACERONE) 200 MG tablet Take 1 tablet (200 mg total) by mouth  daily. 90 tablet 3 05/05/2021   benzonatate (TESSALON) 100 MG capsule Take 1 capsule (100 mg total) by mouth 3 (three) times daily. (Patient not taking: Reported on 05/01/2021) 20 capsule 0 Completed Course   Blood Glucose Monitoring Suppl (TRUE METRIX METER) DEVI Use to test blood sugar three times daily. (Patient not taking: Reported on 05/07/2021) 1 each 0 Not Taking   Blood Glucose Monitoring Suppl (TRUE METRIX METER) w/Device KIT USE AS DIRECTED (Patient not taking: Reported on 05/07/2021) 1 kit 0 Not Taking   carvedilol (COREG) 12.5 MG tablet Take 6.25 mg by mouth 2 (two) times daily with a meal.   05/05/2021   clopidogrel (PLAVIX) 75 MG tablet TAKE 1 TABLET EVERY DAY WITH BREAKFAST (Patient taking differently: Take 75 mg by mouth daily.) 90 tablet 3 05/05/2021    gabapentin (NEURONTIN) 300 MG capsule Take 600 mg by mouth daily.   05/05/2021   glucose blood (RELION TRUE METRIX TEST STRIPS) test strip Use to test blood sugar three times per day. (Patient not taking: Reported on 05/07/2021) 300 each 3 Not Taking   Nutritional Supplements (FEEDING SUPPLEMENT, NEPRO CARB STEADY,) LIQD Take 237 mLs by mouth 2 (two) times daily between meals. (Patient not taking: Reported on 05/07/2021)  0 Not Taking   phenylephrine (NEO-SYNEPHRINE) 1 % nasal spray Place 1 drop into both nostrils every 6 (six) hours as needed for congestion.   unk   polyethylene glycol (MIRALAX / GLYCOLAX) 17 g packet Take 17 g by mouth daily as needed for moderate constipation. (Patient not taking: Reported on 05/01/2021) 14 each 0 Not Taking   TRUEplus Lancets 33G MISC Use to test blood sugar three times per day. (Patient not taking: Reported on 05/07/2021) 300 each 3 Not Taking   umeclidinium bromide (INCRUSE ELLIPTA) 62.5 MCG/INH AEPB Inhale 1 puff into the lungs daily. (Patient not taking: Reported on 05/07/2021) 1 each 0 Not Taking   Scheduled:   sodium chloride   Intravenous Once   amiodarone  200 mg Oral Daily   atorvastatin  80 mg Oral Daily   azithromycin  500 mg Oral Daily   carvedilol  6.25 mg Oral BID WC   clopidogrel  75 mg Oral Daily   feeding supplement (NEPRO CARB STEADY)  237 mL Oral BID BM   fluticasone  2 spray Each Nare Daily   gabapentin  600 mg Oral Daily   insulin aspart  0-9 Units Subcutaneous Q4H   insulin aspart  5 Units Subcutaneous TID WC   [START ON 05/08/2021] predniSONE  40 mg Oral Daily   umeclidinium bromide  1 puff Inhalation Daily   Warfarin - Pharmacist Dosing Inpatient   Does not apply q1600   Infusions:   cefTRIAXone (ROCEPHIN)  IV      Assessment: Pt was on coumadin prior to admission for his hx of afib. He might has an intervention by VVS for his PVD. Heparin has been ordered for bridging while coumadin is on hold.   INR 1.7 Hgb 7.2 Plt wnl  Goal  of Therapy:  Heparin level 0.3-0.7 units/ml Monitor platelets by anticoagulation protocol: Yes   Plan:  Heparin infusion 1500 units/hr F/u with 6 hr HL Daily HL and CBC  Onnie Boer, PharmD, BCIDP, AAHIVP, CPP Infectious Disease Pharmacist 05/07/2021 5:46 PM

## 2021-05-07 NOTE — ED Notes (Signed)
Pt is asking for food. MD paged at this time. Pt has an NPO diet currently. Will continue to monitor.

## 2021-05-07 NOTE — Progress Notes (Signed)
Pt removed from bipap and placed on 6L [place]. Tolerating well at this time, RRT will continue to monitor.

## 2021-05-07 NOTE — Hospital Course (Addendum)
Rodney [name] is a 71 yo male presenting with shortness of breath. PMH significant for  HFpEF, HTN, HLD, tobacco use, T2DM, protein calorie malnutrition, PVD, AS, PAF, s/p L BKA, CAD, CKD III, COPD.   Acute Hypoxic Respiratory Failure   HFpEF   COPD  Patient was brought to ED from home via EMS and was cyanotic, with O2 sats to 70% and poor breath sounds. BNP was 801 CXR showed cardiomegaly with central vascular distention and small left pleural effusion with possible left lower lobe developing infiltrate. Was put on BiPAP in the ED and had bilateral wheezes in anterior lung fields. Was started on azithromycin (1/11-1/13) and ceftriaxone (1/11-1/12) and given steroid course for possible COPD exacerbation (was recently in ICU and treated for COPD exacerbation). Was dosed IV lasix and eventually transitioned to home oral lasix and diuresed net 2 L during hospitalization. At end of hospitalization DME O2 orders were placed for patient de-satting during transferring. Trelegy inhaler samples were given to patient at discharge.  Normocytic Anemia  Has hemoglobin threshold of 8 and received 4 U pRBCs during hospitalization. Denied any acute bleeding or abdominal pain but can consider slow GI losses with chronic anticoagulation vs. Anemia of chronic disease. Hgb at end of hospitalization was 8.3. GI was consulted and recommended IV protonix BID, avoiding NSAIDs and managing conservatively with pRBC transfusion as needed. Was high risk for complications with anesthesia given cardiac hx. Unlikely to be related to groin hematoma after a vascular procedure given no palpable hematoma and just bruising with hemoglobin remaining stable.   PAF on Warfarin Remained rate controlled during hospitalization. Was transitioned to heparin for vasular aortogram during hospitalization. He was then taken off heparin and warfarin due to risk of bleeding with a decreasing hemoglobin level. HAS-BLED of 3-4 high risk of bleeding.  GARFIELD-AF Risk Calculator showed Major Bleeding Incl. Haemorrhagic Stroke for VKA 6.4%.  PVD   CAD   Right Foot Wounds On exam patient's foot has some bullous lesions on the dorsal surface as well as some ulcers with clean bases around the toes. Did not have any purulent drainage or infected looking ulcers. Also had significant venous stasis and 3+ edema on right leg on admission. Ultrasound-guided cannulation left common femoral artery and aortogram with right lower extremity runoff with stent of right SFA (70% stenosis at proximal area of SFA anastomosis). Vascular surgery recommended continuing plavix, statin and warfarin (d/c'd because of problem above)  Chronic conditions remained stable

## 2021-05-07 NOTE — ED Notes (Signed)
Critical lab trop 53. Dr. Armandina Gemma made aware at this time

## 2021-05-07 NOTE — Progress Notes (Signed)
Family Medicine Teaching Service Daily Progress Note Intern Pager: [phone]  Patient name: Rodney [name] Medical record number: [id number] Date of birth: 11/21/1950 Age: 71 y.o. Gender: male  Primary Care Provider: [name], MD Consultants: RD, Wound Care Code Status: Full  Pt Overview and Major Events to Date:  1/10 - Patient admitted  Assessment and Plan:  Rodney [name] is a 71 yo male  presenting with shortness of breath. PMH is significant for HFpEF, hypertension, HLD, tobacco use, T2DM, protein calorie malnutrition, peripheral vascular disease, aortic stenosis, paroxysmal A. fib, s/p L BKA, CAD, CKD stage III, COPD  Acute Hypoxic Respiratory Failure   Shortness of Breath  Likely suffering from some mix of a CHF exacerbation vs COPD exacerbation. See below.   HFpEF   moderate AS   elevated troponin s/p lasix 40 mg (1/11) BNP 801 on admission. CXR showed cardiomegaly with central vascular distention and small left pleural effusion. Dry weight estimated 237 lb, patient weight 238 lb. Last echo on 11/28 showed EF of 50 to 55% and normal right sided function. Patient volume hypervolemic with 3+ pitting edema just below the level of the right knee. Patient respiratory status was improved, patient was sat 100% on 6 L of [place], with good work of breathing, previously was on BiPAP. Troponin trending down at 47 this morning, previously 58. -Lasix 40 mg x1 -Monitor fluid status -Strict I/O -Daily weights -PT/OT eval and treat -NPO while on BiPAP   Concern for COPD exacerbation s/p IV solumedrol 30 mg (1/11) Patient presented with respiratory distress. Had recent hospital admission/ICU 12/2. CXR with concern for small left pleural effusion with chronic changes in the left lower zone that could obscure an early developing infiltrate. Home medications Albuterol inhaler & Incruse Ellipta. Patient respiratory status improving.  -Azithromycin 500 mg daily for 3 days  -Ceftriaxone  2 g daily for 3 days -Consider pseudomonal coverage if not improving after 24 hours -Continue home meds  Anemia of Chronic Disease s/p 1 unit PRBC (1/11) Hx of CAD/PVD, transfusion threshold < 8.  -f/u H/H -AM CBC -Give 1 unit PRBC if hgb < 8  PAF on Warfarin Patient INR today 1.7, goal 2-3. Patient received one dose of warfarin overnight. Patient rate controlled to 70's. After speaking with vascular surgery, it was recommended that patient be transitioned to heparin, for the possibility of having his aortogram procedure in the near future.  -INR goal 2-3 -Daily PT/INR -Warfarin per pharmacy -Consider d/c warfarin and starting hep gtt -Monitor hgb -Cardiac monitoring -Continue amiodarone 200 mg  PVD   CAD   Right Foot Wounds Aortogram sched 1/12 with possible right LE intervention.  -Consult Vasc Surg about sched procedure tomorrow 1/12 -Consult wound care -Continue Plavix 75 mg daily, Atorvastatin 80 mg daily  DM2 s/p solumedrol (1/11) A1c 6.9 on 03/24/21. Per pharmacist, patient will start 5 units NovoLog TID with meals, once he starts eating. CBG range 190-200's -Sensitive SSI -CBG q4h until eating, then with meals  CKD III GFR > 60 today. Cr 1.31 on admission, with baseline 1.1-1.5. Cr today is 1.17 today -continue to monitor  HTN BP hypertensive from 150-170's/60-80's. Will continue to monitor  -Continue Carvedilol 6.25 mg  All other conditions chronic and stable HLD Tobacco Use Protein Calor Malnutrion    FEN/GI: Heart Healthy PPx: [place] Dispo:Home pending clinical improvement .   Subjective:  Patient with no complaints overnight. Feels like he is breathing better. Is concerned he may need O2 at home.  Objective: Temp:  [97.5 F (36.4 C)-97.6 F (36.4 C)] 97.6 F (36.4 C) (01/11 0254) Pulse Rate:  [67-73] 71 (01/11 0545) Resp:  [16-23] 16 (01/11 0545) BP: (150-175)/(67-85) 162/85 (01/11 0545) SpO2:  [79 %-100 %] 100 % (01/11 0545) FiO2 (%):  [60  %] 60 % (01/11 0254) Weight:  [115 kg] 108 kg (01/10 2001) Physical Exam: General: Comfortably sitting in bed, NAD, white male Cardiovascular: RRR, 3/6 systolic murmur heard Respiratory: CTABL, good work of breathing Abdomen: Soft, NTTP, non-distended Extremities: LLE with BKA, RLE with open wounds on plantar surface of foot. 3+ pitting edema to just below knee in RLE.   Laboratory: Recent Labs  Lab 05/06/21 1950 05/07/21 0130  WBC 13.1* 9.2  HGB 7.1* 6.9*  HCT 26.0* 24.8*  PLT 368 270   Recent Labs  Lab 05/06/21 1950 05/07/21 0130  NA 138 139  K 4.4 4.4  CL 100 104  CO2 29 26  BUN 25* 24*  CREATININE 1.31* 1.17  CALCIUM 8.5* 8.4*  PROT 6.5  --   BILITOT 0.7  --   ALKPHOS 74  --   ALT 11  --   AST 16  --   GLUCOSE 202* 204*      Imaging/Diagnostic Tests:   Holley Bouche, MD 05/07/2021, 6:29 AM PGY-1, [place] Intern pager: [phone], text pages welcome

## 2021-05-07 NOTE — Progress Notes (Addendum)
ANTICOAGULATION CONSULT NOTE - Initial Consult  Pharmacy Consult for warfarin Indication: atrial fibrillation  No Known Allergies  Patient Measurements: Height: 6' (182.9 cm) Weight: 108 kg (238 lb) IBW/kg (Calculated) : 77.6  Vital Signs: BP: 167/78 (01/10 2330) Pulse Rate: 71 (01/10 2330)  Labs: Recent Labs    05/06/21 1950  HGB 7.1*  HCT 26.0*  PLT 368  LABPROT 20.4*  INR 1.8*  CREATININE 1.31*  TROPONINIHS 30*    Estimated Creatinine Clearance: 67.6 mL/min (A) (by C-G formula based on SCr of 1.31 mg/dL (H)).   Medical History: Past Medical History:  Diagnosis Date   Aortic stenosis    moderate in 2022   Atrial fibrillation (HCC)    CHF (congestive heart failure) ([place])    Coronary artery disease    Diabetes mellitus without complication (HCC)    HLD (hyperlipidemia)    Hypertension    Peripheral arterial disease (HCC)     Medications:  (Not in a hospital admission)   Assessment: 76 YOM admitted with shortness of breath. Patient is on warfarin at home for h/o Afib. Pharmacy consulted to resume warfarin therapy while admitted.   INR on admission is subtherapeutic at 1.8. Last dose of warfarin on Sunday. Low hgb noted. Transfusion ordered per MD. Plt wnl.   Home warfarin regimen: 7.5 m on M/W/F; 5 mg on all other days   Goal of Therapy:  INR 2-3 Monitor platelets by anticoagulation protocol: Yes   Plan:  -Warfarin 10 mg x 1 dose. Per discussion with MD, will wait until 10 AM to give dose to vascular can see patient in AM and decide if surgery is needed  -Monitor daily INR -Monitor Hgb and for s/s of bleeding   Albertina Parr, PharmD., BCPS, BCCCP Clinical Pharmacist Please refer to [facility] for unit-specific pharmacist

## 2021-05-07 NOTE — Plan of Care (Signed)

## 2021-05-08 ENCOUNTER — Encounter (HOSPITAL_COMMUNITY): Admission: RE | Payer: Self-pay | Source: Home / Self Care

## 2021-05-08 ENCOUNTER — Ambulatory Visit (HOSPITAL_COMMUNITY): Admission: RE | Admit: 2021-05-08 | Payer: Medicare PPO | Source: Home / Self Care | Admitting: Vascular Surgery

## 2021-05-08 LAB — CBC WITH DIFFERENTIAL/PLATELET
Abs Immature Granulocytes: 0.04 10*3/uL (ref 0.00–0.07)
Basophils Absolute: 0 10*3/uL (ref 0.0–0.1)
Basophils Relative: 0 %
Eosinophils Absolute: 0 10*3/uL (ref 0.0–0.5)
Eosinophils Relative: 0 %
HCT: 26.2 % — ABNORMAL LOW (ref 39.0–52.0)
Hemoglobin: 7.8 g/dL — ABNORMAL LOW (ref 13.0–17.0)
Immature Granulocytes: 0 %
Lymphocytes Relative: 6 %
Lymphs Abs: 0.6 10*3/uL — ABNORMAL LOW (ref 0.7–4.0)
MCH: 24.5 pg — ABNORMAL LOW (ref 26.0–34.0)
MCHC: 29.8 g/dL — ABNORMAL LOW (ref 30.0–36.0)
MCV: 82.1 fL (ref 80.0–100.0)
Monocytes Absolute: 0.7 10*3/uL (ref 0.1–1.0)
Monocytes Relative: 7 %
Neutro Abs: 8.8 10*3/uL — ABNORMAL HIGH (ref 1.7–7.7)
Neutrophils Relative %: 87 %
Platelets: 265 10*3/uL (ref 150–400)
RBC: 3.19 MIL/uL — ABNORMAL LOW (ref 4.22–5.81)
RDW: 15.9 % — ABNORMAL HIGH (ref 11.5–15.5)
WBC: 10.2 10*3/uL (ref 4.0–10.5)
nRBC: 0 % (ref 0.0–0.2)

## 2021-05-08 LAB — HEPARIN LEVEL (UNFRACTIONATED)
Heparin Unfractionated: 0.33 IU/mL (ref 0.30–0.70)
Heparin Unfractionated: 0.32 IU/mL (ref 0.30–0.70)

## 2021-05-08 LAB — TYPE AND SCREEN
ABO/RH(D): A POS
Antibody Screen: NEGATIVE
Unit division: 0
Unit division: 0

## 2021-05-08 LAB — PROTIME-INR
INR: 2.2 — ABNORMAL HIGH (ref 0.8–1.2)
Prothrombin Time: 24.6 seconds — ABNORMAL HIGH (ref 11.4–15.2)

## 2021-05-08 LAB — BPAM RBC
Blood Product Expiration Date: 202302032359
Blood Product Expiration Date: 202302072359
ISSUE DATE / TIME: 202301110233
ISSUE DATE / TIME: 202301111958
Unit Type and Rh: 6200
Unit Type and Rh: 6200

## 2021-05-08 LAB — BASIC METABOLIC PANEL
Anion gap: 9 (ref 5–15)
BUN: 36 mg/dL — ABNORMAL HIGH (ref 8–23)
CO2: 27 mmol/L (ref 22–32)
Calcium: 8.2 mg/dL — ABNORMAL LOW (ref 8.9–10.3)
Chloride: 96 mmol/L — ABNORMAL LOW (ref 98–111)
Creatinine, Ser: 1.42 mg/dL — ABNORMAL HIGH (ref 0.61–1.24)
GFR, Estimated: 53 mL/min — ABNORMAL LOW (ref >60)
Glucose, Bld: 252 mg/dL — ABNORMAL HIGH (ref 70–99)
Potassium: 4.3 mmol/L (ref 3.5–5.1)
Sodium: 132 mmol/L — ABNORMAL LOW (ref 135–145)

## 2021-05-08 LAB — GLUCOSE, CAPILLARY
Glucose-Capillary: 228 mg/dL — ABNORMAL HIGH (ref 70–99)
Glucose-Capillary: 176 mg/dL — ABNORMAL HIGH (ref 70–99)
Glucose-Capillary: 177 mg/dL — ABNORMAL HIGH (ref 70–99)
Glucose-Capillary: 330 mg/dL — ABNORMAL HIGH (ref 70–99)

## 2021-05-08 LAB — HEMOGLOBIN AND HEMATOCRIT, BLOOD
HCT: 26.4 % — ABNORMAL LOW (ref 39.0–52.0)
Hemoglobin: 8 g/dL — ABNORMAL LOW (ref 13.0–17.0)

## 2021-05-08 SURGERY — ABDOMINAL AORTOGRAM W/LOWER EXTREMITY
Anesthesia: LOCAL

## 2021-05-08 MED ORDER — INSULIN GLARGINE-YFGN 100 UNIT/ML ~~LOC~~ SOLN
15.0000 [IU] | Freq: Every day | SUBCUTANEOUS | Status: DC
  Administered 2021-05-08 – 2021-05-15 (×8): 15 [IU] via SUBCUTANEOUS
  Filled 2021-05-08 (×12): qty 0.15

## 2021-05-08 MED ORDER — INSULIN ASPART 100 UNIT/ML IJ SOLN
0.0000 [IU] | Freq: Three times a day (TID) | INTRAMUSCULAR | Status: DC
  Administered 2021-05-09: 09:00:00 2 [IU] via SUBCUTANEOUS
  Administered 2021-05-09: 5 [IU] via SUBCUTANEOUS
  Administered 2021-05-09 – 2021-05-10 (×2): 2 [IU] via SUBCUTANEOUS
  Administered 2021-05-10: 18:00:00 5 [IU] via SUBCUTANEOUS
  Administered 2021-05-10: 2 [IU] via SUBCUTANEOUS
  Administered 2021-05-11: 19:00:00 5 [IU] via SUBCUTANEOUS
  Administered 2021-05-11 (×2): 3 [IU] via SUBCUTANEOUS
  Administered 2021-05-12: 1 [IU] via SUBCUTANEOUS
  Administered 2021-05-12: 2 [IU] via SUBCUTANEOUS
  Administered 2021-05-13 – 2021-05-14 (×2): 1 [IU] via SUBCUTANEOUS
  Administered 2021-05-14 – 2021-05-15 (×4): 2 [IU] via SUBCUTANEOUS
  Administered 2021-05-15: 1 [IU] via SUBCUTANEOUS

## 2021-05-08 MED ORDER — FUROSEMIDE 10 MG/ML IJ SOLN
40.0000 mg | Freq: Once | INTRAMUSCULAR | Status: AC
  Administered 2021-05-08: 40 mg via INTRAVENOUS
  Filled 2021-05-08: qty 4

## 2021-05-08 NOTE — Progress Notes (Signed)
PT Cancellation Note  Patient Details Name: Rodney [name] MRN: [id number] DOB: 05-Sep-1950   Cancelled Treatment:    Reason Eval/Treat Not Completed: PT screened, no needs identified, will sign off  Discussed role of PT and pt's earlier session with OT. He has been doing scoot transfers from bed to wheelchair for some time and denies need for PT assessment.    [name], PT Acute Rehabilitation Services  Pager [phone] Office [phone]    Rodney [name] 05/08/2021, 2:44 PM

## 2021-05-08 NOTE — TOC Initial Note (Signed)
Transition of Care [facility]) - Initial/Assessment Note    Patient Details  Name: Rodney [name] MRN: [id number] Date of Birth: 11-Nov-1950  [facility] CM/SW Contact:    Cyndi Bender, RN Phone Number: 05/08/2021, 2:45 PM  Clinical Narrative:              Spoke to patient regarding transition needs. He states he has had HH with [facility]. He will need home 02 and is ok to use in house provider, [employer] for his oxygen needs. He has all the DME that he needs beside home 02.  TOC will continue to follow. Address, Phone number and PCP verified.    Expected Discharge Plan: [place] Barriers to Discharge: Continued Medical Work up   Patient Goals and CMS Choice    Fix my right foot    Expected Discharge Plan and Services Expected Discharge Plan: [place]   Discharge Planning Services: CM Consult   Living arrangements for the past 2 months: Single Family Home                                      Prior Living Arrangements/Services Living arrangements for the past 2 months: Single Family Home Lives with:: Significant Other          Need for Family Participation in Patient Care: Yes (Comment) Care giver support system in place?: Yes (comment) Current home services: DME (walker, WC)    Activities of Daily Living      Permission Sought/Granted                  Emotional Assessment Appearance:: Appears stated age Attitude/Demeanor/Rapport: Angry Affect (typically observed): Anxious Orientation: : Oriented to Self, Oriented to Place, Oriented to  Time, Oriented to Situation Alcohol / Substance Use: Not Applicable Psych Involvement: No (comment)  Admission diagnosis:  Hypoxia [R09.02] COPD exacerbation ([place]) [J44.1] Patient Active Problem List   Diagnosis Date Noted   Acute respiratory failure with hypoxia ([place])    Acute on chronic heart failure with preserved ejection fraction (HFpEF) ([place])     Hypoxia 05/06/2021   Acute respiratory failure with hypoxia and hypercarbia ([place]) 03/26/2021   Acute renal failure superimposed on stage 3a chronic kidney disease ([place]) 03/26/2021   Demand ischemia ([place]) 03/26/2021   Iron (Fe) deficiency anemia 03/26/2021   Influenza A with pneumonia 03/24/2021   Hyperkalemia 12/10/2020   COPD exacerbation ([place]) 12/09/2020   Acute combined systolic and diastolic congestive heart failure ([place])    Peripheral neuropathy 11/11/2020   Open wound of right great toe    Supratherapeutic INR 11/10/2020   History of DVT (deep vein thrombosis) 11/10/2020   Acute on chronic anemia    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   CAD (coronary artery disease) 02/15/2020   CKD stage 3 due to type 2 diabetes mellitus ([place]) 02/15/2020   Long term (current) use of anticoagulants 12/29/2019   S/P BKA (below knee amputation) unilateral, left ([place]) 11/14/2019   High risk social situation 11/14/2019   AF (paroxysmal atrial fibrillation) ([place]) 10/03/2019   Severe sepsis ([place]) 09/28/2019   HFrEF (heart failure with reduced ejection fraction) ([place]) 09/28/2019   Moderate aortic stenosis 09/28/2019   Anemia in chronic illness 09/27/2019   Protein calorie malnutrition ([place])    PVD (peripheral vascular disease) ([place])    Umbilical hernia 24/40/1027  Obesity 10/09/2015   Tobacco abuse 09/07/2013   DM (diabetes mellitus), type 2 with neurological complications ([place]) 86/76/1950   Hypercholesteremia 10/28/2010   ERECTILE DYSFUNCTION 05/22/2009   Essential hypertension 01/23/2009   PCP:  [name], MD Pharmacy:   [facility], [place] - [address] [place] [address] Phone: [phone] Fax: [phone]  [place] ([place]), [place] - [address] [place] ([place]) [place] [address] Phone: [phone] Fax: [phone]  [place], [place] [address] Phone: [phone] Fax: [phone]     Social Determinants of Health (SDOH) Interventions    Readmission Risk Interventions Readmission Risk Prevention Plan 03/26/2021 11/17/2020 08/13/2020  Transportation Screening Complete Complete Complete  PCP or Specialist Appt within 3-5 Days - - Complete  [place] or [place] - - Complete  Social Work Consult for [place] Planning/Counseling - - Complete  Palliative Care Screening - - Not Applicable  Medication Review Press photographer) Complete Complete Complete  PCP or Specialist appointment within 3-5 days of discharge - Complete -  [place] or [place] - Complete -  SW Recovery Care/Counseling Consult Complete Complete -  Palliative Care Screening Not Applicable Not Applicable -  [place] Not Applicable Not Applicable -  Some recent data might be hidden

## 2021-05-08 NOTE — Progress Notes (Addendum)
ANTICOAGULATION CONSULT NOTE - Follow Up Consult  Pharmacy Consult for heparin and warfarin Indication: atrial fibrillation  No Known Allergies  Patient Measurements: Height: 6' (182.9 cm) Weight: 108 kg (238 lb) IBW/kg (Calculated) : 77.6 Heparin Dosing Weight: 100 kg  Vital Signs: Temp: 97.6 F (36.4 C) (01/12 1215) Temp Source: Oral (01/12 1215) BP: 119/71 (01/12 1215) Pulse Rate: 63 (01/12 1215)  Labs: Recent Labs    05/06/21 1950 05/06/21 2319 05/07/21 0130 05/07/21 0210 05/07/21 0746 05/07/21 1100 05/08/21 0044 05/08/21 0413 05/08/21 0958  HGB 7.1*  --  6.9*  --   --  7.2* 7.8* 8.0*  --   HCT 26.0*  --  24.8*  --   --  25.5* 26.2* 26.4*  --   PLT 368  --  270  --   --  258 265  --   --   LABPROT 20.4*  --   --  19.5*  --   --  24.6*  --   --   INR 1.8*  --   --  1.7*  --   --  2.2*  --   --   HEPARINUNFRC  --   --   --   --   --   --  0.33  --  0.32  CREATININE 1.31*  --  1.17  --   --   --  1.42*  --   --   TROPONINIHS 30* 53* 58*  --  47*  --   --   --   --     Estimated Creatinine Clearance: 62.4 mL/min (A) (by C-G formula based on SCr of 1.42 mg/dL (H)).   Assessment: 51 YOM with medical history relevant for atrial fibrillation on warfarin PTA who presented with SOB. Pharmacy consulted to manage warfarin and heparin while patient is admitted.   Patient received warfarin 10 mg PO 01/11 at 0200. INR today (1/12) is up from 1.7 to 2.2 after x 1 dose. Warfarin continuing to be on hold due to pending VVS procedure.  Heparin infusion is therapeutic x 2 with heparin levels at 0.33 and 0.32, respectively. Hgb today is low but stable (7.8 >> 8.0). PLTs WNL at 265. No s/sx of bleeding.  Goal of Therapy:  Heparin level 0.3-0.7 units/ml Monitor platelets by anticoagulation protocol: Yes   Plan:  Continue heparin at 1500 units/hr.  F/u heparin level and INR daily.  Continue to monitor H&H & PLTs. Monitor for s/sx of bleeding.  Debbora Presto PharmD  Candidate 05/08/2021,1:21 PM

## 2021-05-08 NOTE — Evaluation (Signed)
Occupational Therapy Evaluation Patient Details Name: Rodney [name] MRN: [id number] DOB: [birth year] Today's Date: 05/08/2021   History of Present Illness Pt is a 71 y/o male admitted 1/10 with shortness of breath. CXR with small L pleural effusion. CHF versus COPD exacerbation. 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   Clinical Impression   PTA patient reports using wc for mobility and completing scoot transfers with independence, requires min assist for LB ADLs and supervision for bathing but otherwise independent for ADLs.  Patient admitted for above and presenting near baseline level (listed above) for ADLs and transfers.  He declines OOB, but demonstrates ability to complete dynamic sitting balance with independence, scooting at EOB with independence (pt keeping R Foot off floor).  Attempted education for R LE footwear, but pt declines education voicing plan for angiogram to figure out what is going [name]. Pt is [name] 4L [place] during session with VSS, reports he doesn't wear O2 at home but thinks he needs to now.  Based [name] performance today, pt agreeable to no further OT needs identified acutely.  OT will sign off at this time.  If further needs arise please re-consult.        Recommendations for follow up therapy are one component of a multi-disciplinary discharge planning process, led by the attending physician.  Recommendations may be updated based [name] patient status, additional functional criteria and insurance authorization.   Follow Up Recommendations  No OT follow up    Assistance Recommended at Discharge Intermittent Supervision/Assistance  Patient can return home with the following A little help with walking and/or transfers;A little help with bathing/dressing/bathroom;Assistance with cooking/housework    Functional Status Assessment     Equipment Recommendations  None recommended by OT     Recommendations for Other Services       Precautions / Restrictions Precautions Precautions: Fall Precaution Comments: R LE wounds Restrictions Weight Bearing Restrictions: No Other Position/Activity Restrictions: reports still heel weightbearing only to R LE      Mobility Bed Mobility Overal bed mobility: Modified Independent                  Transfers                          Balance Overall balance assessment: Independent                                         ADL either performed or assessed with clinical judgement   ADL Overall ADL's : At baseline                                       General ADL Comments: reports requires assist for LB ADLs (sock/bathing) but lateral leans for clothing mgmt.  Declined transfers but able to scoot at side of bed with modified independence.     Vision   Vision Assessment?: No apparent visual deficits     Perception     Praxis      Pertinent Vitals/Pain Pain Assessment: No/denies pain     Hand Dominance Right   Extremity/Trunk Assessment Upper Extremity Assessment Upper Extremity Assessment: Overall WFL for tasks assessed   Lower Extremity Assessment Lower Extremity  Assessment: Defer to PT evaluation       Communication Communication Communication: No difficulties   Cognition Arousal/Alertness: Awake/alert Behavior During Therapy: WFL for tasks assessed/performed Overall Cognitive Status: Within Functional Limits for tasks assessed                                       General Comments  VSS [name] 4L [place]    Exercises     Shoulder Instructions      Home Living Family/patient expects to be discharged to:: Private residence Living Arrangements: Spouse/significant other Available Help at Discharge: Friend(s) Type of Home: Mobile home Home Access: Ramped entrance     [place]: One level     Bathroom Shower/Tub: Animal nutritionist: [place]: Wheelchair - manual;Tub bench;BSC/3in1;Cane - single point          Prior Functioning/Environment Prior Level of Function : Independent/Modified Independent             Mobility Comments: independnet transfer couch /BSC/WC ADLs Comments: at wheelchair level- has help with showers but hasn't been showering recently mostly basin baths;        OT Problem List:        OT Treatment/Interventions:      OT Goals(Current goals can be found in the care plan section) Acute Rehab OT Goals Patient Stated Goal: feel better and get home OT Goal Formulation: With patient  OT Frequency:      Co-evaluation              AM-PAC OT "6 Clicks" Daily Activity     Outcome Measure Help from another person eating meals?: None Help from another person taking care of personal grooming?: None Help from another person toileting, which includes using toliet, bedpan, or urinal?: None Help from another person bathing (including washing, rinsing, drying)?: A Little Help from another person to put [name] and taking off regular upper body clothing?: None Help from another person to put [name] and taking off regular lower body clothing?: A Little 6 Click Score: 22   End of Session Equipment Utilized During Treatment: Oxygen (4L) Nurse Communication: Mobility status  Activity Tolerance: Patient tolerated treatment well Patient left: in bed;with call bell/phone within reach;with bed alarm set  OT Visit Diagnosis: Other abnormalities of gait and mobility (R26.89)                Time: 8366-2947 OT Time Calculation (min): 22 min Charges:  OT General Charges $OT Visit: 1 Visit OT Evaluation $OT Eval Low Complexity: 1 Low  Jolaine [name], OT Acute Rehabilitation Services Pager [phone] Office [phone]   Delight Stare 05/08/2021, 9:35 AM

## 2021-05-08 NOTE — Progress Notes (Signed)
[facility]   Subjective  - Breathing a little better.  No pain in the right LE   Objective (!) 148/78 73 97.6 F (36.4 C) (Oral) 18 100%  Intake/Output Summary (Last 24 hours) at 05/08/2021 0748 Last data filed at 05/08/2021 2376 Gross per 24 hour  Intake 695.45 ml  Output 1700 ml  Net -1004.55 ml   No respiratory distress, O2 support SAT 100 % 2L Right foot warm to touch no motor in toes, positive motor in ankle and upper leg.  Skin warm with ischemic second toe and plantar foot     Decreased dorsal erythema from 7 days ago, decreased edema with elevation in bed.    Assessment/Planning: PAD  status post right common iliac artery stent on 08/05/2020 by Dr. Carlis Abbott.  He then underwent right SFA to BK popliteal artery bypass with non reversed GSV on 08/07/2020 by Dr. Donzetta Matters.   On 11/14/2020, he underwent angioplasty and stent placement in the right SFA to BK popliteal bypass on 11/14/2020 by Dr. Carlis Abbott. He subsequently underwent right great toe amputation on 11/15/2020 by Dr. Sharol Given.                His ABI's demonstrate biphasic inflow and the duplex has some stenosis at the inflow with EDV 279 monophasic flow distally.  Calcified tibials on ABI.  He has new wounds with significant edema and blistering in the right foot that have produced new non healing wounds for 7 months now.    Pending INR reduction.  Coumadin on hold and he is on Heparin.  Cont. Plavix Will re schedule angiogram when patient is stable.  [name] 05/08/2021 7:48 AM --  Laboratory Lab Results: Recent Labs    05/07/21 1100 05/08/21 0044 05/08/21 0413  WBC 6.1 10.2  --   HGB 7.2* 7.8* 8.0*  HCT 25.5* 26.2* 26.4*  PLT 258 265  --    BMET Recent Labs    05/07/21 0130 05/08/21 0044  NA 139 132*  K 4.4 4.3  CL 104 96*  CO2 26 27  GLUCOSE 204* 252*  BUN 24* 36*  CREATININE 1.17 1.42*  CALCIUM 8.4* 8.2*    COAG Lab Results  Component Value Date   INR 2.2  (H) 05/08/2021   INR 1.7 (H) 05/07/2021   INR 1.8 (H) 05/06/2021   No results found for: PTT

## 2021-05-08 NOTE — Progress Notes (Signed)
Rounded on patient during night shift.  Patient was sleeping comfortably and so I did not wake him.  He was in no respiratory distress or nasal cannula.  Vitals on the screen were within expected limits.  BP 126/69 (BP Location: Left Arm)    Pulse 72    Temp 98.4 F (36.9 C) (Oral)    Resp (!) 22    Ht 6' (1.829 m)    Wt 108 kg    SpO2 100%    BMI 32.28 kg/m   General: Elderly male lying in bed in no distress Respiratory: Nasal cannula in place, no respiratory distress  A/P Continuing to treat for both COPD exacerbation and CHF exacerbation.  We will monitor his vitals overnight and follow-up on a.m. labs.  Remainder per daytime progress note.

## 2021-05-08 NOTE — Progress Notes (Signed)
Rodney [name] for heparin Indication: atrial fibrillation  No Known Allergies  Patient Measurements: Height: 6' (182.9 cm) Weight: 108 kg (238 lb) IBW/kg (Calculated) : 77.6 Heparin Dosing Weight: 100kg  Vital Signs: Temp: 98.7 F (37.1 C) (01/11 2328) Temp Source: Oral (01/11 2328) BP: 139/72 (01/11 2328) Pulse Rate: 71 (01/11 2328)  Labs: Recent Labs    05/06/21 1950 05/06/21 2319 05/07/21 0130 05/07/21 0210 05/07/21 0746 05/07/21 1100 05/08/21 0044  HGB 7.1*  --  6.9*  --   --  7.2* 7.8*  HCT 26.0*  --  24.8*  --   --  25.5* 26.2*  PLT 368  --  270  --   --  258 265  LABPROT 20.4*  --   --  19.5*  --   --  24.6*  INR 1.8*  --   --  1.7*  --   --  2.2*  HEPARINUNFRC  --   --   --   --   --   --  0.33  CREATININE 1.31*  --  1.17  --   --   --  1.42*  TROPONINIHS 30* 53* 58*  --  47*  --   --      Estimated Creatinine Clearance: 62.4 mL/min (A) (by C-G formula based on SCr of 1.42 mg/dL (H)).   Medical History: Past Medical History:  Diagnosis Date   Aortic stenosis    moderate in 2022   Atrial fibrillation (HCC)    CHF (congestive heart failure) (HCC)    Coronary artery disease    Diabetes mellitus without complication (HCC)    HLD (hyperlipidemia)    Hypertension    Peripheral arterial disease (HCC)     Medications:  Medications Prior to Admission  Medication Sig Dispense Refill Last Dose   albuterol (VENTOLIN HFA) 108 (90 Base) MCG/ACT inhaler INHALE 2 PUFFS INTO THE LUNGS EVERY 6 (SIX) HOURS AS NEEDED FOR WHEEZING OR SHORTNESS OF BREATH. 1 each 0 unk   atorvastatin (LIPITOR) 80 MG tablet TAKE 1 TABLET EVERY DAY (Patient taking differently: Take 80 mg by mouth daily.) 90 tablet 0 05/05/2021   dextromethorphan-guaiFENesin (MUCINEX DM) 30-600 MG 12hr tablet Take 1 tablet by mouth 2 (two) times daily. 30 tablet 0 Past Month   fluticasone (FLONASE) 50 MCG/ACT nasal spray Place 2 sprays into both nostrils daily. (Patient  taking differently: Place 2 sprays into both nostrils daily as needed for allergies.) 16 g 6 Past Month   furosemide (LASIX) 40 MG tablet Take 1 tablet (40 mg total) by mouth daily as needed for fluid (swelling). (Patient taking differently: Take 40 mg by mouth daily.) 30 tablet 1 05/05/2021   insulin NPH-regular Human (NOVOLIN 70/30 RELION) (70-30) 100 UNIT/ML injection Inject 15-30 Units into the skin See admin instructions. Inject 30 units into the skin with breakfast and 15 units with supper   05/05/2021   lactose free nutrition (BOOST) LIQD Take 237 mLs by mouth daily.   05/05/2021   metFORMIN (GLUCOPHAGE) 1000 MG tablet Take 1 tablet (1,000 mg total) by mouth daily. 90 tablet 3 05/05/2021   warfarin (COUMADIN) 5 MG tablet Take 7.23m (one and a half tablets) on Mondays, Wednesdays and Fridays; Take 558m(one tablet) on Tuesdays, Thursdays, Saturdays and Sundays. OR as directed by Warfarin Clinic (Patient taking differently: Take 5-7.5 mg by mouth See admin instructions. 7.5 mg Monday,Wednesday and friday  5 mg Tuesday,Thursday,Saturday and sunday) 100 tablet 3 05/04/2021   acetaminophen (TYLENOL)  650 MG CR tablet Take 1,300 mg by mouth in the morning and at bedtime.   05/05/2021   amiodarone (PACERONE) 200 MG tablet Take 1 tablet (200 mg total) by mouth daily. 90 tablet 3 05/05/2021   benzonatate (TESSALON) 100 MG capsule Take 1 capsule (100 mg total) by mouth 3 (three) times daily. (Patient not taking: Reported on 05/01/2021) 20 capsule 0 Completed Course   Blood Glucose Monitoring Suppl (TRUE METRIX METER) DEVI Use to test blood sugar three times daily. (Patient not taking: Reported on 05/07/2021) 1 each 0 Not Taking   Blood Glucose Monitoring Suppl (TRUE METRIX METER) w/Device KIT USE AS DIRECTED (Patient not taking: Reported on 05/07/2021) 1 kit 0 Not Taking   carvedilol (COREG) 12.5 MG tablet Take 6.25 mg by mouth 2 (two) times daily with a meal.   05/05/2021   clopidogrel (PLAVIX) 75 MG tablet TAKE 1 TABLET EVERY  DAY WITH BREAKFAST (Patient taking differently: Take 75 mg by mouth daily.) 90 tablet 3 05/05/2021   gabapentin (NEURONTIN) 300 MG capsule Take 600 mg by mouth daily.   05/05/2021   glucose blood (RELION TRUE METRIX TEST STRIPS) test strip Use to test blood sugar three times per day. (Patient not taking: Reported on 05/07/2021) 300 each 3 Not Taking   Nutritional Supplements (FEEDING SUPPLEMENT, NEPRO CARB STEADY,) LIQD Take 237 mLs by mouth 2 (two) times daily between meals. (Patient not taking: Reported on 05/07/2021)  0 Not Taking   phenylephrine (NEO-SYNEPHRINE) 1 % nasal spray Place 1 drop into both nostrils every 6 (six) hours as needed for congestion.   unk   polyethylene glycol (MIRALAX / GLYCOLAX) 17 g packet Take 17 g by mouth daily as needed for moderate constipation. (Patient not taking: Reported on 05/01/2021) 14 each 0 Not Taking   TRUEplus Lancets 33G MISC Use to test blood sugar three times per day. (Patient not taking: Reported on 05/07/2021) 300 each 3 Not Taking   umeclidinium bromide (INCRUSE ELLIPTA) 62.5 MCG/INH AEPB Inhale 1 puff into the lungs daily. (Patient not taking: Reported on 05/07/2021) 1 each 0 Not Taking   Scheduled:   amiodarone  200 mg Oral Daily   atorvastatin  80 mg Oral Daily   azithromycin  500 mg Oral Daily   carvedilol  6.25 mg Oral BID WC   clopidogrel  75 mg Oral Daily   feeding supplement (NEPRO CARB STEADY)  237 mL Oral BID BM   fluticasone  2 spray Each Nare Daily   furosemide  40 mg Intravenous Once   gabapentin  600 mg Oral Daily   insulin aspart  0-9 Units Subcutaneous Q4H   insulin aspart  5 Units Subcutaneous TID WC   predniSONE  40 mg Oral Daily   umeclidinium bromide  1 puff Inhalation Daily   Warfarin - Pharmacist Dosing Inpatient   Does not apply q1600   Infusions:   sodium chloride 10 mL/hr at 05/07/21 2329   cefTRIAXone (ROCEPHIN)  IV 2 g (05/07/21 2332)   heparin 1,500 Units/hr (05/07/21 1818)    Assessment: Pt was on coumadin prior to  admission for his hx of afib. He might has an intervention by [facility] for his PVD. Heparin has been ordered for bridging while coumadin is on hold.   1/12 AM update:  Heparin level is therapeutic  Hgb low but stable from yesterday (7.2>>7.8)  Goal of Therapy:  Heparin level 0.3-0.7 units/ml Monitor platelets by anticoagulation protocol: Yes   Plan:  Cont heparin at 1500 units/hr  1100 heparin level  Narda Bonds, PharmD, BCPS Clinical Pharmacist Phone: [phone]

## 2021-05-08 NOTE — Progress Notes (Signed)
PT Cancellation Note  Patient Details Name: Rodney [name] MRN: [id number] DOB: 03/30/1951   Cancelled Treatment:    Reason Eval/Treat Not Completed: Patient told OT that he does not want to see PT today. He has "jumped through the hoops before and doesn't want to do that today." If time, will attempt to see later today.    [name], PT Acute Rehabilitation Services  Pager [phone] Office [phone]   [name] 05/08/2021, 9:33 AM

## 2021-05-08 NOTE — Progress Notes (Signed)
Initial Nutrition Assessment  DOCUMENTATION CODES:   Obesity unspecified, Non-severe (moderate) malnutrition in context of chronic illness  INTERVENTION:  - Encourage PO intake - Continue Nepro Shake po BID, each supplement provides 425 kcal and 19 grams protein  - MVI with minerals daily  NUTRITION DIAGNOSIS:   Moderate Malnutrition related to chronic illness (CHF, T2DM) as evidenced by mild fat depletion, moderate muscle depletion.  GOAL:   Patient will meet greater than or equal to 90% of their needs  MONITOR:   PO intake, Labs, Weight trends, Supplement acceptance, Skin  REASON FOR ASSESSMENT:   Consult Assessment of nutrition requirement/status, Wound healing  ASSESSMENT:   Pt admitted with hypoxia likely secondary to CHF exacerbation and COPD exacerbation. PMH includes CHF, HTN, HLD, tobacco use, T2DM, PVD, aortic stenosis, afib, s/p L BKA, CAD, CKD III, and COPD.  Pt reports PTA he was eating 1 meal per day. Sometimes his spouse may cook or they may eat out at [employer] or [employer]. Since his L BKA, he is unable to cook. He enjoys all varieties of foods including potatoes, vegetables, meat, sugar free sweets and diet beverages. He may sometimes snack throughout the day but reports that he "eats when he is hungry." Pt drinks Nepro at home and is agreeable to continue receive them during admission.   He endorses a usual body weight of 230 lbs and denies any recent weight loss. Pt noted to weigh 238 lbs during admission.  Medications: azithromycin, SSI, prednisone, IV abx  Labs: sodium 132, BUN 36, Cr 1.42, CBG's 176-243  UOP: +1.7L x24 hours I/O's: -1.3L since admission  NUTRITION - FOCUSED PHYSICAL EXAM:  Flowsheet Row Most Recent Value  Orbital Region Mild depletion  Upper Arm Region Moderate depletion  Thoracic and Lumbar Region Mild depletion  Buccal Region No depletion  Temple Region No depletion  Clavicle Bone Region No depletion  Clavicle and Acromion  Bone Region No depletion  Scapular Bone Region No depletion  Dorsal Hand Mild depletion  Patellar Region Moderate depletion  Anterior Thigh Region Moderate depletion  Posterior Calf Region Mild depletion  [L BKA]  Edema (RD Assessment) Mild  Hair Reviewed  Eyes Reviewed  Mouth Reviewed  Skin Reviewed  Nails Reviewed       Diet Order:   Diet Order             Diet heart healthy/carb modified Room service appropriate? Yes; Fluid consistency: Thin  Diet effective now                   EDUCATION NEEDS:   Education needs have been addressed  Skin:  Skin Assessment: Reviewed RN Assessment  Last BM:  05/05/21  Height:   Ht Readings from Last 1 Encounters:  05/06/21 6' (1.829 m)    Weight:   Wt Readings from Last 1 Encounters:  05/06/21 108 kg    Ideal Body Weight:  75.6 kg (adj for L BKA)  BMI:  Body mass index is 32.28 kg/m.  Estimated Nutritional Needs:   Kcal:  2100-2300  Protein:  105-120g  Fluid:  >/=2.1L  Clayborne Dana, RDN, LDN Clinical Nutrition

## 2021-05-08 NOTE — Progress Notes (Addendum)
°  Transition of Care [facility]) Screening Note   Patient Details  Name: Rodney [name] Date of Birth: December 12, 1950   Transition of Care [facility]) CM/SW Contact:    Cyndi Bender, RN Phone Number: 05/08/2021, 8:14 AM    Transition of Care Department [facility]) has reviewed patient  Will watch for therapy recommendations. Has used [facility] in the past.  If new patient transition needs arise, please place a TOC consult.

## 2021-05-08 NOTE — Progress Notes (Signed)
Family Medicine Teaching Service Daily Progress Note Intern Pager: [phone]  Patient name: Rodney [name] Medical record number: [id number] Date of birth: [birth year] Age: 71 y.o. Gender: male  Primary Care Provider: [name], MD Consultants: RD, Code Status: Full  Pt Overview and Major Events to Date:  1/10 - Patient admitted  Assessment and Plan:  Rodney [name] is a 71 yo male  presenting with shortness of breath. PMH is significant for HFpEF, hypertension, HLD, tobacco use, T2DM, protein calorie malnutrition, peripheral vascular disease, aortic stenosis, paroxysmal A. fib, s/p L BKA, CAD, CKD stage III, COPD   Acute Hypoxic Respiratory Failure   Shortness of Breath  Likely suffering from some mix of a CHF exacerbation vs COPD exacerbation. See below.   HFpEF   moderate AS   elevated troponin s/p lasix 40 mg x 3 (1/11 - 1/12) Wt today?? Patient volume status appears improved with pitting edema just above mid tibia, where edema was at the level of just below the knee. I: 404 ml O: 1.3L -Lasix 40 mg x1 -Monitor fluid status -Strict I/O -Daily weights -PT/OT eval and treat  Concern for COPD exacerbation s/p IV solumedrol 30 mg (1/11) Patient respiratory status improved. He is now on 4 L [place], down from BiPAP yesterday morning. He remains afebrile, without changes in sputum production.  -Azithromycin 500 mg daily (day 2/3) -Ceftriaxion 2 g daily (day 2/3) -Prednsone 40 mg for 5 days of steroids total (day 2/5) -Continue Incruse Ellipta  CKD III GFR 53, down from >60 yesterday. Cr 1.42, increased from 1.17 yesterday. May be 2/2 lasix use. Patient received a dose of lasix this am, and two doses yesterday. Given volume/respiratory status, will likely continue the lasix daily/ -Consider holding lasix in future, if Cr rise to great. -AM BMP  Hyponatremia Patient Na fell to 132 overnight from 139 yesterday -Continue to monitor -AM BMP  Anemia of Chronic Disease s/p  2 unit PRBC (1/11) (stable) Post transfusion hgb 8.0. Patient with no signs of bleeding.    PAF previously on Warfarin, on heparin gtt INR 2.2 (1.7) today, at goal. PT 24.6 (19.5). -Heparin gtt per pharmacy -INR goal 2-3 -Daily PT/INR -Monitor hgb -Cardiac monitoring -Continue amiodarone 200 mg daily   PVD   CAD   Right Foot Wounds Patient seen by wound care yesterday, recommending daily covering with betadine and bandage changes. Patient switched to heparin gtt in event that they might have vascular procedure in near future.  -Continue Plavix 75 mg daily   DM2 s/p solumedrol (1/11) Sugars 220-240 range. Patient received 29 units of insuline yesterday. Patient sugars slightly elevated in the setting of oral steroids -SSI change -Semglee 15 units qhs   HTN BP stable overnight, ranging from 120-140's/60-80's -Continue to monitor  All other conditions chronic and stable HLD Tobacco Use Protein Calorie Malnutrion  FEN/GI: Heart Healthy PPx: Heparin gtt Dispo:Home in 2-3 days.    Subjective:  NAEON. Patient feels that breathing is improving.   Objective: Temp:  [97.6 F (36.4 C)-98.7 F (37.1 C)] 97.6 F (36.4 C) (01/12 0401) Pulse Rate:  [55-77] 73 (01/12 0401) Resp:  [12-24] 18 (01/12 0401) BP: (109-164)/(52-122) 148/78 (01/12 0401) SpO2:  [90 %-100 %] 100 % (01/12 0401) Physical Exam: General: Comfortable, conversant, NAD, white male Cardiovascular: RRR, 3/6 systolic murmur Respiratory: CTABL Abdomen: Soft, NTTP, non-distended Extremities: LLE with BKA, RLE w/ open wounds on plantar surface, necrosis present on second toe from the right. 3+ pitting edema to level just  above mid tibia   Laboratory: Recent Labs  Lab 05/07/21 0130 05/07/21 1100 05/08/21 0044 05/08/21 0413  WBC 9.2 6.1 10.2  --   HGB 6.9* 7.2* 7.8* 8.0*  HCT 24.8* 25.5* 26.2* 26.4*  PLT 270 258 265  --    Recent Labs  Lab 05/06/21 1950 05/07/21 0130 05/08/21 0044  NA 138 139 132*   K 4.4 4.4 4.3  CL 100 104 96*  CO2 29 26 27   BUN 25* 24* 36*  CREATININE 1.31* 1.17 1.42*  CALCIUM 8.5* 8.4* 8.2*  PROT 6.5  --   --   BILITOT 0.7  --   --   ALKPHOS 74  --   --   ALT 11  --   --   AST 16  --   --   GLUCOSE 202* 204* 252*      Imaging/Diagnostic Tests:   Holley Bouche, MD 05/08/2021, 6:07 AM PGY-1, [facility] Intern pager: [phone], text pages welcome

## 2021-05-08 NOTE — Progress Notes (Signed)
Inpatient Diabetes Program Recommendations  AACE/ADA: New Consensus Statement on Inpatient Glycemic Control (2015)  Target Ranges:  Prepandial:   less than 140 mg/dL      Peak postprandial:   less than 180 mg/dL (1-2 hours)      Critically ill patients:  140 - 180 mg/dL   Lab Results  Component Value Date   GLUCAP 176 (H) 05/08/2021   HGBA1C 6.9 (H) 03/24/2021    Review of Glycemic Control  Latest Reference Range & Units 05/07/21 23:57 05/08/21 04:04 05/08/21 07:50  Glucose-Capillary 70 - 99 mg/dL 243 (H) 228 (H) 176 (H)  (H): Data is abnormally high Diabetes history: Type 2 DM Outpatient Diabetes medications: Metformin 1000 mg QD, Novolog 70/30- 30 QA, 15 QP Current orders for Inpatient glycemic control: Novolog 5 units TID, Novolog 0-9 units Q4H Prednisone 40 mg QD  Inpatient Diabetes Program Recommendations:    Now that patient has a diet order consider: -Changing correction to Novolog 0-9 units TID & HS -Adding Levemir 10 units BID  Thanks, Bronson Curb, MSN, RNC-OB Diabetes Coordinator [phone] (8a-5p)

## 2021-05-09 DIAGNOSIS — R0902 Hypoxemia: Secondary | ICD-10-CM | POA: Diagnosis not present

## 2021-05-09 DIAGNOSIS — E440 Moderate protein-calorie malnutrition: Secondary | ICD-10-CM

## 2021-05-09 HISTORY — DX: Moderate protein-calorie malnutrition: E44.0

## 2021-05-09 LAB — BASIC METABOLIC PANEL
Anion gap: 9 (ref 5–15)
BUN: 50 mg/dL — ABNORMAL HIGH (ref 8–23)
CO2: 26 mmol/L (ref 22–32)
Calcium: 8 mg/dL — ABNORMAL LOW (ref 8.9–10.3)
Chloride: 97 mmol/L — ABNORMAL LOW (ref 98–111)
Creatinine, Ser: 1.67 mg/dL — ABNORMAL HIGH (ref 0.61–1.24)
GFR, Estimated: 44 mL/min — ABNORMAL LOW (ref >60)
Glucose, Bld: 252 mg/dL — ABNORMAL HIGH (ref 70–99)
Potassium: 4.3 mmol/L (ref 3.5–5.1)
Sodium: 132 mmol/L — ABNORMAL LOW (ref 135–145)

## 2021-05-09 LAB — GLUCOSE, CAPILLARY
Glucose-Capillary: 279 mg/dL — ABNORMAL HIGH (ref 70–99)
Glucose-Capillary: 156 mg/dL — ABNORMAL HIGH (ref 70–99)
Glucose-Capillary: 159 mg/dL — ABNORMAL HIGH (ref 70–99)
Glucose-Capillary: 265 mg/dL — ABNORMAL HIGH (ref 70–99)
Glucose-Capillary: 299 mg/dL — ABNORMAL HIGH (ref 70–99)

## 2021-05-09 LAB — CBC
HCT: 26.7 % — ABNORMAL LOW (ref 39.0–52.0)
Hemoglobin: 7.9 g/dL — ABNORMAL LOW (ref 13.0–17.0)
MCH: 24.8 pg — ABNORMAL LOW (ref 26.0–34.0)
MCHC: 29.6 g/dL — ABNORMAL LOW (ref 30.0–36.0)
MCV: 84 fL (ref 80.0–100.0)
Platelets: 249 10*3/uL (ref 150–400)
RBC: 3.18 MIL/uL — ABNORMAL LOW (ref 4.22–5.81)
RDW: 16.1 % — ABNORMAL HIGH (ref 11.5–15.5)
WBC: 11.8 10*3/uL — ABNORMAL HIGH (ref 4.0–10.5)
nRBC: 0 % (ref 0.0–0.2)

## 2021-05-09 LAB — PROTIME-INR
INR: 2.2 — ABNORMAL HIGH (ref 0.8–1.2)
Prothrombin Time: 24 seconds — ABNORMAL HIGH (ref 11.4–15.2)

## 2021-05-09 LAB — HEMOGLOBIN AND HEMATOCRIT, BLOOD
HCT: 25.7 % — ABNORMAL LOW (ref 39.0–52.0)
Hemoglobin: 7.5 g/dL — ABNORMAL LOW (ref 13.0–17.0)

## 2021-05-09 LAB — HEPARIN LEVEL (UNFRACTIONATED): Heparin Unfractionated: 0.28 IU/mL — ABNORMAL LOW (ref 0.30–0.70)

## 2021-05-09 MED ORDER — SODIUM CHLORIDE 0.9 % IV SOLN: 2.0000 g | INTRAVENOUS | Status: DC

## 2021-05-09 NOTE — Progress Notes (Signed)
Went to round on patient at the start of night shift.  Patient was resting comfortably in bed with no complaints and in no distress.  BP 139/65 (BP Location: Right Arm)    Pulse 64    Temp 98 F (36.7 C) (Oral)    Resp 17    Ht 6' (1.829 m)    Wt 108.7 kg    SpO2 98%    BMI 32.50 kg/m  General: Elderly male lying in bed in no distress, nasal cannula placed on 2 L Respiratory: Some crackles present bilaterally, nasal cannula, no respiratory distress Cardiac: S1, S2 present Abdomen: Bowel sounds present, no abdominal pain  A/P Patient's breathing status much improved from when I saw him the other night.  He has no complaints today.  It appears his vascular procedure will be able to be performed during his hospitalization and is set for Monday.  Of note his hemoglobin this morning was 7.9, 7.5 on recheck and he does have a transfusion threshold of 8.0.  I discussed with day team at the start of shift and we are going to hold off until the check tomorrow as we do not think he is actively bleeding and think this may just be variation in lab checks.  We will consider transfusing tomorrow if he is below 8.0 for hemoglobin.  Remainder per daytime progress note.

## 2021-05-09 NOTE — Progress Notes (Signed)
Inpatient Diabetes Program Recommendations  AACE/ADA: New Consensus Statement on Inpatient Glycemic Control (2015)  Target Ranges:  Prepandial:   less than 140 mg/dL      Peak postprandial:   less than 180 mg/dL (1-2 hours)      Critically ill patients:  140 - 180 mg/dL   Lab Results  Component Value Date   GLUCAP 156 (H) 05/09/2021   HGBA1C 6.9 (H) 03/24/2021    Review of Glycemic Control  Latest Reference Range & Units 05/08/21 16:25 05/08/21 20:45 05/09/21 08:07  Glucose-Capillary 70 - 99 mg/dL 279 (H) 330 (H) 156 (H)  (H): Data is abnormally high Diabetes history: Type 2 DM Outpatient Diabetes medications: Metformin 1000 mg QD, Novolog 70/30- 30 QA, 15 QP Current orders for Inpatient glycemic control: Novolog 5 units TID, Novolog 0-9 units TID Prednisone 40 mg QD   Inpatient Diabetes Program Recommendations:     Consider increasing Novolog 8 units TID (Assuming patient consuming >50% of meals).   Thanks, Bronson Curb, MSN, RNC-OB Diabetes Coordinator [phone] (8a-5p)

## 2021-05-09 NOTE — Progress Notes (Signed)
[place] for Warfarin and Heparin Indication: atrial fibrillation  No Known Allergies  Patient Measurements: Height: 6' (182.9 cm) Weight: 108.7 kg (239 lb 10.2 oz) IBW/kg (Calculated) : 77.6  Heparin Dosing Weight: 100 kg  Vital Signs: Temp: 97.9 F (36.6 C) (01/13 1221) Temp Source: Oral (01/13 1221) BP: 93/51 (01/13 1221) Pulse Rate: 65 (01/13 1221)  Labs: Recent Labs    05/06/21 2319 05/07/21 0130 05/07/21 0210 05/07/21 0746 05/07/21 1100 05/08/21 0044 05/08/21 0413 05/08/21 0958 05/09/21 0056 05/09/21 0710  HGB  --  6.9*  --   --  7.2* 7.8* 8.0*  --  7.9* 7.5*  HCT  --  24.8*  --   --  25.5* 26.2* 26.4*  --  26.7* 25.7*  PLT  --  270  --   --  258 265  --   --  249  --   LABPROT  --   --  19.5*  --   --  24.6*  --   --  24.0*  --   INR  --   --  1.7*  --   --  2.2*  --   --  2.2*  --   HEPARINUNFRC  --   --   --   --   --  0.33  --  0.32 0.28*  --   CREATININE  --  1.17  --   --   --  1.42*  --   --  1.67*  --   TROPONINIHS 53* 58*  --  47*  --   --   --   --   --   --     Estimated Creatinine Clearance: 53.1 mL/min (A) (by C-G formula based on SCr of 1.67 mg/dL (H)).   Assessment: 33 YOM with medical history relevant for atrial fibrillation on warfarin PTA who presented with SOB. Pharmacy consulted to manage warfarin and heparin while patient is admitted.   Patient received warfarin 10 mg PO 01/11 at 0200. INR is therapeutic 2.2 after x 1 dose. Warfarin continuing to be on hold due to pending VVS procedure.    CBC remains low, hgb down 8 > 7.5. Plts 249. Per chart review, no reported s/sx of bleeding.  Goal of Therapy:  INR 2-3 Heparin level 0.3-0.7 units/ml Monitor platelets by anticoagulation protocol: Yes   Plan:  Continue to hold warfarin due to pending procedure Hold heparin infusion until INR < 2 Check heparin level and INR daily Continue to monitor H&H and platelets    Thank you for allowing  pharmacy to be a part of this patients care.  Ardyth Harps, PharmD Clinical Pharmacist

## 2021-05-09 NOTE — Progress Notes (Signed)
Family Medicine Teaching Service Daily Progress Note Intern Pager: [phone]  Patient name: Rodney [name] Medical record number: [id number] Date of birth: Jul 14, 1950 Age: 71 y.o. Gender: male  Primary Care Provider: [name], MD Consultants: RD, VVS Code Status: Full  Pt Overview and Major Events to Date:  1/10 - Patient admitted   Assessment and Plan:   Rodney [name] is a 71 yo male  presenting with shortness of breath. PMH is significant for HFpEF, hypertension, HLD, tobacco use, T2DM, protein calorie malnutrition, peripheral vascular disease, aortic stenosis, paroxysmal A. fib, s/p L BKA, CAD, CKD stage III, COPD   Acute Hypoxic Respiratory Failure   Shortness of Breath  Likely suffering from some mix of a CHF exacerbation vs COPD exacerbation. See below.   HFpEF   moderate AS   elevated troponin s/p lasix 40 mg x 3 (1/11 - 1/12) Patient weight today is 108.7 kg, up from 108 kg on admission. Volume status appears improved. Patient has pitting edema just below mid shin, improved from yesterday. UOP was 1.625 L yesterday. Respiratory improved from yesterday, with patient on 2 L Rodney [name].  -Hold Lasix 40 mg today, reconsider home dose tomorrow -Monitor fluid status -Strict I/O's -Monitor respiratory status -Daily wt -Attempt to lay patient flat and monitor respiratory status -Wean O2 as tolerated  Concern for COPD exacerbation s/p IV solumedrol 30 mg (1/11) Patient respiratory status stable from yesterday. Patient remains on 4 L [place], stable from yesterday. No complaints of cough, or sputum production. Given history of COPD, patient O2 saturations should be between 88-92%. -D/c CFTX 2 g daily(day 3/3)  -D/c Azithromycin 500 mg daily (3/3)  -Prednisone 40 mg for total 5 days steroids (day 3/5) -Continue Incruse Ellipta -Keep O2 saturations between 88-92% -Wean O2 as tolerated   CKD III GFR 44 continues to trend down from 53 yesterday. Cr with large jump to 1.67 from  1.42. BUN 50 from 36 yesterday. Patient's kidney function likely worsening in setting of lasix use.   -Hold lasix today, reconsider home dose tomorrow -AM BMP -Consider re-dosing patient gabapentin if GFR < 30  Hyponatremia Na stable today at 132.  -Will continue to monitor  Anemia of Chronic Disease s/p 2 unit PRBC (1/11) (stable) Patient hgb stable at 7.9 today, then 7.5 on recheck, from 8.0 yesterday. Transfusion threshold 8.0. Unsure of accuracy of lab, let hgb stand and recheck later. -Continue to monitor  PAF previously on Warfarin, on heparin gtt INR today is 2.2, stable from yesterday and at goal (goal of 2-3, per pharmacy). PT stable at 24.0 from 24.6 yesterday. Heparin study was slightly low at 0.28 today, from 0.32 day previous, with goal being 0.3-0.7. Patient appears to be adequately anticoagulated.  -Continue carvedilol 6.25 mg BID -Continue amiodarone 200 mg daily -Continue heparin gtt  PVD   CAD   Right Foot Wounds VVS saw patient yesterday and determined they would reschedule patient aortogram when he is medically stable. Wound dressed this morning. -Continue Plavix 75 mg  DM2 Patient started on Semglee 15 units yesterday evening. Will track sugars today, now that long acting insulin is onboard. Yesterday sugars ranged from 176-330. patient received 31 units of short acting insulin yesterday.  -continue SSI -Semglee 15 units qhs -Novolog 5 units qac  HTN Patient BP stable yesterday, ranging from 110-150/60-90's -Continue to monitor -Continue carvedilol 6.25 mg   FEN/GI: Heart HEalthy PPx: Heparin gtt Dispo:Home pending clinical improvement . Marland Kitchen   Subjective:  Patient notes he's had  difficulty breathing, but was improved after a treatment with albuterol. Breathing better than when he was home, but still not at his baseline.   Objective: Temp:  [97.5 F (36.4 C)-98.4 F (36.9 C)] 97.5 F (36.4 C) (01/13 0354) Pulse Rate:  [63-84] 64 (01/13 0435) Resp:   [19-23] 19 (01/13 0354) BP: (114-155)/(66-92) 149/66 (01/13 0354) SpO2:  [99 %-100 %] 100 % (01/13 0354) Physical Exam: General: Ill appearing, non-toxic, good mood, interactive, NAD, white male Cardiovascular: RRR, 3/6 systolic murmur Respiratory: Minimal diffuse wheezing, minimal crackles in lower lung fields Abdomen: Soft, NTTP, non-distended Extremities: Moving all extremities independently, LBKA, R. Foot bandaged. Pitting edema in right leg, just below mid point of tibia.   Laboratory: Recent Labs  Lab 05/07/21 1100 05/08/21 0044 05/08/21 0413 05/09/21 0056  WBC 6.1 10.2  --  11.8*  HGB 7.2* 7.8* 8.0* 7.9*  HCT 25.5* 26.2* 26.4* 26.7*  PLT 258 265  --  249   Recent Labs  Lab 05/06/21 1950 05/07/21 0130 05/08/21 0044 05/09/21 0056  NA 138 139 132* 132*  K 4.4 4.4 4.3 4.3  CL 100 104 96* 97*  CO2 29 26 27 26   BUN 25* 24* 36* 50*  CREATININE 1.31* 1.17 1.42* 1.67*  CALCIUM 8.5* 8.4* 8.2* 8.0*  PROT 6.5  --   --   --   BILITOT 0.7  --   --   --   ALKPHOS 74  --   --   --   ALT 11  --   --   --   AST 16  --   --   --   GLUCOSE 202* 204* 252* 252*      Imaging/Diagnostic Tests:   Holley Bouche, MD 05/09/2021, 5:50 AM PGY-1, [place] Intern pager: [phone], text pages welcome

## 2021-05-09 NOTE — Progress Notes (Signed)
[facility]  Subjective  -states he is feeling better.   Objective 132/73 60 97.9 F (36.6 C) (Oral) 20 100%  Intake/Output Summary (Last 24 hours) at 05/09/2021 1140 Last data filed at 05/09/2021 1000 Gross per 24 hour  Intake 479.21 ml  Output 1500 ml  Net -1020.79 ml       Laboratory Lab Results: Recent Labs    05/08/21 0044 05/08/21 0413 05/09/21 0056 05/09/21 0710  WBC 10.2  --  11.8*  --   HGB 7.8*   < > 7.9* 7.5*  HCT 26.2*   < > 26.7* 25.7*  PLT 265  --  249  --    < > = values in this interval not displayed.   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  He was scheduled for right lower extremity arteriogram with me yesterday given evidence of stenosis in the bypass on duplex with ongoing tissue loss.  This was canceled after he was admitted on Wednesday night prior to procedure through the ED with shortness of breath due to heart failure and COPD and was unable to lay flat.  He is improving.  I have discussed with the medicine team and we will reschedule him for Monday in the Cath Lab with Dr. Donzetta Matters for aortogram with lower extremity arteriogram.  Please hold his Coumadin through the weekend.  I also updated the patient and he is agreeable to the plan.  Marty Heck 05/09/2021 11:40 AM --

## 2021-05-09 NOTE — Care Management Important Message (Signed)
Important Message  Patient Details  Name: Rodney [name] MRN: [id number] Date of Birth: 1950/08/03   Medicare Important Message Given:  Yes     Hannah Beat 05/09/2021, 3:50 PM

## 2021-05-09 NOTE — Plan of Care (Signed)
°  Problem: Health Behavior/Discharge Planning: Goal: Ability to manage health-related needs will improve Outcome: Progressing   Problem: Clinical Measurements: Goal: Ability to maintain clinical measurements within normal limits will improve Outcome: Progressing Goal: Will remain free from infection Outcome: Progressing Goal: Diagnostic test results will improve Outcome: Progressing Goal: Respiratory complications will improve Outcome: Progressing Goal: Cardiovascular complication will be avoided Outcome: Progressing   Problem: Activity: Goal: Risk for activity intolerance will decrease Outcome: Progressing   Problem: Nutrition: Goal: Adequate nutrition will be maintained Outcome: Progressing   Problem: Coping: Goal: Level of anxiety will decrease Outcome: Progressing   Problem: Elimination: Goal: Will not experience complications related to bowel motility Outcome: Progressing Goal: Will not experience complications related to urinary retention Outcome: Progressing   Problem: Pain Managment: Goal: General experience of comfort will improve Outcome: Progressing   Problem: Safety: Goal: Ability to remain free from injury will improve Outcome: Progressing   Problem: Skin Integrity: Goal: Risk for impaired skin integrity will decrease Outcome: Progressing   Problem: Education: Goal: Ability to demonstrate management of disease process will improve Outcome: Progressing Goal: Ability to verbalize understanding of medication therapies will improve Outcome: Progressing Goal: Individualized Educational Video(s) Outcome: Progressing   Problem: Activity: Goal: Capacity to carry out activities will improve Outcome: Progressing   Problem: Cardiac: Goal: Ability to achieve and maintain adequate cardiopulmonary perfusion will improve Outcome: Progressing   Problem: Education: Goal: Knowledge of disease or condition will improve Outcome: Progressing Goal: Knowledge of  the prescribed therapeutic regimen will improve Outcome: Progressing Goal: Individualized Educational Video(s) Outcome: Progressing   Problem: Activity: Goal: Ability to tolerate increased activity will improve Outcome: Progressing Goal: Will verbalize the importance of balancing activity with adequate rest periods Outcome: Progressing   Problem: Respiratory: Goal: Ability to maintain a clear airway will improve Outcome: Progressing Goal: Levels of oxygenation will improve Outcome: Progressing Goal: Ability to maintain adequate ventilation will improve Outcome: Progressing

## 2021-05-10 DIAGNOSIS — R0902 Hypoxemia: Secondary | ICD-10-CM | POA: Diagnosis not present

## 2021-05-10 LAB — BASIC METABOLIC PANEL
Anion gap: 8 (ref 5–15)
BUN: 46 mg/dL — ABNORMAL HIGH (ref 8–23)
CO2: 28 mmol/L (ref 22–32)
Calcium: 8.5 mg/dL — ABNORMAL LOW (ref 8.9–10.3)
Chloride: 99 mmol/L (ref 98–111)
Creatinine, Ser: 1.21 mg/dL (ref 0.61–1.24)
GFR, Estimated: >60 mL/min (ref >60)
Glucose, Bld: 176 mg/dL — ABNORMAL HIGH (ref 70–99)
Potassium: 4.6 mmol/L (ref 3.5–5.1)
Sodium: 135 mmol/L (ref 135–145)

## 2021-05-10 LAB — CBC
HCT: 26.7 % — ABNORMAL LOW (ref 39.0–52.0)
Hemoglobin: 8 g/dL — ABNORMAL LOW (ref 13.0–17.0)
MCH: 25.1 pg — ABNORMAL LOW (ref 26.0–34.0)
MCHC: 30 g/dL (ref 30.0–36.0)
MCV: 83.7 fL (ref 80.0–100.0)
Platelets: 251 10*3/uL (ref 150–400)
RBC: 3.19 MIL/uL — ABNORMAL LOW (ref 4.22–5.81)
RDW: 15.8 % — ABNORMAL HIGH (ref 11.5–15.5)
WBC: 10 10*3/uL (ref 4.0–10.5)
nRBC: 0 % (ref 0.0–0.2)

## 2021-05-10 LAB — PROTIME-INR
INR: 1.8 — ABNORMAL HIGH (ref 0.8–1.2)
Prothrombin Time: 20.6 seconds — ABNORMAL HIGH (ref 11.4–15.2)

## 2021-05-10 LAB — HEPARIN LEVEL (UNFRACTIONATED)
Heparin Unfractionated: <0.1 IU/mL — ABNORMAL LOW (ref 0.30–0.70)
Heparin Unfractionated: 0.24 IU/mL — ABNORMAL LOW (ref 0.30–0.70)
Heparin Unfractionated: <0.1 IU/mL — ABNORMAL LOW (ref 0.30–0.70)

## 2021-05-10 LAB — GLUCOSE, CAPILLARY
Glucose-Capillary: 155 mg/dL — ABNORMAL HIGH (ref 70–99)
Glucose-Capillary: 199 mg/dL — ABNORMAL HIGH (ref 70–99)
Glucose-Capillary: 291 mg/dL — ABNORMAL HIGH (ref 70–99)
Glucose-Capillary: 363 mg/dL — ABNORMAL HIGH (ref 70–99)

## 2021-05-10 MED ORDER — UMECLIDINIUM BROMIDE 62.5 MCG/ACT IN AEPB
1.0000 | INHALATION_SPRAY | Freq: Every day | RESPIRATORY_TRACT | Status: DC
  Filled 2021-05-10: qty 7

## 2021-05-10 MED ORDER — FUROSEMIDE 40 MG PO TABS: 40.0000 mg | ORAL_TABLET | Freq: Every day | ORAL | Status: DC | PRN

## 2021-05-10 MED ORDER — HEPARIN (PORCINE) 25000 UT/250ML-% IV SOLN
2000.0000 [IU]/h | INTRAVENOUS | Status: DC
  Administered 2021-05-10: 1500 [IU]/h via INTRAVENOUS
  Administered 2021-05-10 – 2021-05-12 (×3): 1650 [IU]/h via INTRAVENOUS
  Administered 2021-05-14: 1950 [IU]/h via INTRAVENOUS
  Filled 2021-05-10 (×5): qty 250

## 2021-05-10 NOTE — Progress Notes (Signed)
Family Medicine Teaching Service Daily Progress Note Intern Pager: [phone]  Patient name: Rodney [name] Medical record number: [id number] Date of birth: 02-22-1951 Age: 71 y.o. Gender: male  Primary Care Provider: [name], MD Consultants: RD, VVS Code Status: Full  Pt Overview and Major Events to Date:  1/10 - Patient admitted   Assessment and Plan:   Rodney [name] is a 71 yo male  presenting with shortness of breath. PMH is significant for HFpEF, hypertension, HLD, tobacco use, T2DM, protein calorie malnutrition, peripheral vascular disease, aortic stenosis, paroxysmal A. fib, s/p L BKA, CAD, CKD stage III, COPD   Acute Hypoxic Respiratory Failure   Shortness of Breath  Likely suffering from some mix of a CHF exacerbation vs COPD exacerbation. See below.    HFpEF   moderate AS   elevated troponin s/p lasix 40 mg x 3 (1/11 - 1/12) Wt today 109.1 kg, increased from 108.7 kg yesterday. Volume status is stable from yesterday, with 3+ pitting edema midway up the tibia. UOP yesterday was 700 cc, with 241 cc intake. Patient net negative 459 cc yesterday. -Restart home lasix, 40 mg PO daily prn for swelling -Monitor volume status -Daily wt -Strict I/Os -Attempt to lay patient flat and monitor respiratory status -Wean O2 as tolerated  Concern for COPD exacerbation (resolved)   COPD s/p IV solumedrol 30 mg (1/11) Respiratory status stable from yesterday. Patient O2 sats 97-100% in charts, despite set goal of 88-92. Will adjust patient supplemental O2 per goal and communicate goal to RN team.  -Prednisone 40 mg for total 5 days of steroids (day 4/5) -Continue Incruse Ellipta, reschedule for qhs -Keep O2 saturations between 88-92 -Wean O2 as tolerated  CKD III (improved) GFR > 60 today, improved from 56 yesterday. Cr 1.21 today, improved from 1.67 yesterday.   -AM BMP   Hyponatremia (resolved) Na 135 today, improved from 132 yesterday.   Anemia of Chronic Disease  (stable) s/p 2 unit PRBC (1/11)  Patient Hgb 8.0, stable from yesterday. Transfusion threshold < 8.0    PAF previously on Warfarin, on heparin gtt Patient pulse stable in 60's. INR 1.8 (2.2) today, PT 20.6 (24.0) -Continue carvedilol 6.25 mg BID -Continue amiodarone 200 mg daily -Continue heparin gtt  PVD   CAD   Right Foot Wounds VVS to have aortagram and lower extremity arteriorgram on Monday 05/12/21. Extremity left without bandage today, to dry out.  DM2 BG between 156-299 yesterday. Received 24 units of short acting. Most recent sugars above 250, diabetes coordinator recommend increasing novolog dose 8 units. Will keep novolog at 5 units, as patient is on steroids and that is likely affecting his sugars. -Semglee 15 units qhs -Novolog 5 units qac  HTN BP stable, ranging from 110-130's/50-60's  FEN/GI: Heart HEalthy PPx: Heparin gtt Dispo:Home pending clinical improvement .    Subjective:  Patient complains of SOB at night, that is usually treated by his early morning albuterol treatment. Patient comfortable in bed, good work of breathing.   Objective: Temp:  [97.9 F (36.6 C)-98 F (36.7 C)] 98 F (36.7 C) (01/13 1946) Pulse Rate:  [60-65] 64 (01/13 1946) Resp:  [15-20] 15 (01/14 0205) BP: (93-139)/(51-73) 139/65 (01/13 1946) SpO2:  [97 %-100 %] 98 % (01/13 1946) Physical Exam: General: Ill appearing, non-toxic, cooperative, conversational, NAD, white male Cardiovascular: RRR, 3/6 systolic murmur Respiratory: Minimal crackles in left lung field, diffuse coarse breath sounds throughout both lung fields Abdomen: Soft, NTTP, non-distended Extremities: Moving all extremities independently, LBKA, R.  Foot with bandages and betadine between toes, 3+ pitting edema in right leg, just below mid tibia  Laboratory: Recent Labs  Lab 05/08/21 0044 05/08/21 0413 05/09/21 0056 05/09/21 0710 05/10/21 0142  WBC 10.2  --  11.8*  --  10.0  HGB 7.8*   < > 7.9* 7.5* 8.0*  HCT  26.2*   < > 26.7* 25.7* 26.7*  PLT 265  --  249  --  251   < > = values in this interval not displayed.   Recent Labs  Lab 05/06/21 1950 05/07/21 0130 05/08/21 0044 05/09/21 0056  NA 138 139 132* 132*  K 4.4 4.4 4.3 4.3  CL 100 104 96* 97*  CO2 29 26 27 26   BUN 25* 24* 36* 50*  CREATININE 1.31* 1.17 1.42* 1.67*  CALCIUM 8.5* 8.4* 8.2* 8.0*  PROT 6.5  --   --   --   BILITOT 0.7  --   --   --   ALKPHOS 74  --   --   --   ALT 11  --   --   --   AST 16  --   --   --   GLUCOSE 202* 204* 252* 252*      Imaging/Diagnostic Tests:   Holley Bouche, MD 05/10/2021, 5:44 AM PGY-1, [place] Intern pager: [phone], text pages welcome

## 2021-05-10 NOTE — Progress Notes (Signed)
[place] for Warfarin and Heparin Indication: atrial fibrillation  No Known Allergies  Patient Measurements: Height: 6' (182.9 cm) Weight: 109.1 kg (240 lb 8.4 oz) IBW/kg (Calculated) : 77.6  Heparin Dosing Weight: 100 kg  Vital Signs: Temp: 97.9 F (36.6 C) (01/14 1140) Temp Source: Oral (01/14 1140) BP: 132/69 (01/14 1140) Pulse Rate: 64 (01/14 1140)  Labs: Recent Labs    05/08/21 0044 05/08/21 0413 05/09/21 0056 05/09/21 0710 05/10/21 0142 05/10/21 0655 05/10/21 1434  HGB 7.8*   < > 7.9* 7.5* 8.0*  --   --   HCT 26.2*   < > 26.7* 25.7* 26.7*  --   --   PLT 265  --  249  --  251  --   --   LABPROT 24.6*  --  24.0*  --  20.6*  --   --   INR 2.2*  --  2.2*  --  1.8*  --   --   HEPARINUNFRC 0.33   < > 0.28*  --  <0.10*  --  0.24*  CREATININE 1.42*  --  1.67*  --   --  1.21  --    < > = values in this interval not displayed.     Estimated Creatinine Clearance: 73.5 mL/min (by C-G formula based on SCr of 1.21 mg/dL).   Assessment: 25 YOM with medical history relevant for atrial fibrillation on warfarin PTA who presented with SOB. Pharmacy consulted to manage warfarin and heparin while patient is admitted.  Patient received warfarin 10 mg PO 01/11 at 0200. INR is therapeutic 2.2 after x 1 dose. Warfarin continuing to be on hold due to pending VVS procedure. Plans for surgery on 05/12/21.   Restarted Heparin gtt on 1/14 AM when INR < 2 (INR 1.8)  Current heparin rate: 1,500 units/hr  Hgb 8.0, but no concern for bleeding at this time  Heparin level: 0.24, sub-therapeutic    Goal of Therapy:  INR 2-3 Heparin level 0.3-0.7 units/ml Monitor platelets by anticoagulation protocol: Yes   Plan:  Continue to hold warfarin due to pending procedure Increase heparin IV 1650 units/hr 6 hour heparin level Check heparin level and INR daily Continue to monitor H&H and platelets F/u plans for surgery on monday  Thank you for  allowing pharmacy to participate in this patient's care.  Levonne Spiller, PharmD PGY1 Acute Care Resident  05/10/2021,3:30 PM

## 2021-05-10 NOTE — Progress Notes (Signed)
[place] for Warfarin and Heparin Indication: atrial fibrillation  No Known Allergies  Patient Measurements: Height: 6' (182.9 cm) Weight: 109.1 kg (240 lb 8.4 oz) IBW/kg (Calculated) : 77.6  Heparin Dosing Weight: 100 kg  Vital Signs: Temp: 97.7 F (36.5 C) (01/14 2044) Temp Source: Oral (01/14 2044) BP: 140/68 (01/14 2044) Pulse Rate: 62 (01/14 2044)  Labs: Recent Labs    05/08/21 0044 05/08/21 0413 05/09/21 0056 05/09/21 0710 05/10/21 0142 05/10/21 0655 05/10/21 1434 05/10/21 2138  HGB 7.8*   < > 7.9* 7.5* 8.0*  --   --   --   HCT 26.2*   < > 26.7* 25.7* 26.7*  --   --   --   PLT 265  --  249  --  251  --   --   --   LABPROT 24.6*  --  24.0*  --  20.6*  --   --   --   INR 2.2*  --  2.2*  --  1.8*  --   --   --   HEPARINUNFRC 0.33   < > 0.28*  --  <0.10*  --  0.24* <0.10*  CREATININE 1.42*  --  1.67*  --   --  1.21  --   --    < > = values in this interval not displayed.     Estimated Creatinine Clearance: 73.5 mL/min (by C-G formula based on SCr of 1.21 mg/dL).   Assessment: 5 YOM with medical history relevant for atrial fibrillation on warfarin PTA who presented with SOB. Pharmacy consulted to manage warfarin and heparin while patient is admitted.  Patient received warfarin 10 mg PO 01/11 at 0200. INR is therapeutic 2.2 after x 1 dose. Warfarin continuing to be on hold due to pending VVS procedure. Plans for surgery on 05/12/21.   Heparin level tonight undetectable. Per RN heparin was off for unknown amount of time about an hour before lab collected so this level is likely inaccurate. Will continue current rate for now and recheck with am labs.   Goal of Therapy:  INR 2-3 Heparin level 0.3-0.7 units/ml Monitor platelets by anticoagulation protocol: Yes   Plan:  Continue heparin 1650 units/h for now Recheck heparin level with am labs  Arrie Senate, PharmD, BCPS, [facility] Clinical Pharmacist [phone] Please check  AMION for all [facility] Pharmacy numbers 05/10/2021

## 2021-05-10 NOTE — Progress Notes (Signed)
ANTICOAGULATION CONSULT NOTE  Pharmacy Consult for Heparin Indication: atrial fibrillation Brief A/P: INR < 2 today--will restart heparin  No Known Allergies  Patient Measurements: Height: 6' (182.9 cm) Weight: 108.7 kg (239 lb 10.2 oz) IBW/kg (Calculated) : 77.6  Heparin Dosing Weight: 100 kg  Vital Signs: Temp: 98 F (36.7 C) (01/13 1946) Temp Source: Oral (01/13 1946) BP: 139/65 (01/13 1946) Pulse Rate: 64 (01/13 1946)  Labs: Recent Labs    05/07/21 0746 05/07/21 1100 05/08/21 0044 05/08/21 0413 05/08/21 0958 05/09/21 0056 05/09/21 0710 05/10/21 0142  HGB  --    < > 7.8*   < >  --  7.9* 7.5* 8.0*  HCT  --    < > 26.2*   < >  --  26.7* 25.7* 26.7*  PLT  --    < > 265  --   --  249  --  251  LABPROT  --   --  24.6*  --   --  24.0*  --  20.6*  INR  --   --  2.2*  --   --  2.2*  --  1.8*  HEPARINUNFRC  --    < > 0.33  --  0.32 0.28*  --  <0.10*  CREATININE  --   --  1.42*  --   --  1.67*  --   --   TROPONINIHS 47*  --   --   --   --   --   --   --    < > = values in this interval not displayed.     Estimated Creatinine Clearance: 53.1 mL/min (A) (by C-G formula based on SCr of 1.67 mg/dL (H)).   Assessment: 71 y.o. male with h/o Afib, Coumadin on hold while awaiting arteriogram, for heparin  Goal of Therapy:  INR 2-3 Heparin level 0.3-0.7 units/ml Monitor platelets by anticoagulation protocol: Yes   Plan:  Start heparin 1500 units/hr Check heparin level in 8 hours.  Phillis Knack, PharmD, BCPS

## 2021-05-11 DIAGNOSIS — R0902 Hypoxemia: Secondary | ICD-10-CM | POA: Diagnosis not present

## 2021-05-11 LAB — HEPARIN LEVEL (UNFRACTIONATED): Heparin Unfractionated: 0.31 IU/mL (ref 0.30–0.70)

## 2021-05-11 LAB — LACTATE DEHYDROGENASE: LDH: 147 U/L (ref 98–192)

## 2021-05-11 LAB — BASIC METABOLIC PANEL
Anion gap: 5 (ref 5–15)
BUN: 51 mg/dL — ABNORMAL HIGH (ref 8–23)
CO2: 29 mmol/L (ref 22–32)
Calcium: 8.3 mg/dL — ABNORMAL LOW (ref 8.9–10.3)
Chloride: 98 mmol/L (ref 98–111)
Creatinine, Ser: 1.47 mg/dL — ABNORMAL HIGH (ref 0.61–1.24)
GFR, Estimated: 51 mL/min — ABNORMAL LOW (ref >60)
Glucose, Bld: 347 mg/dL — ABNORMAL HIGH (ref 70–99)
Potassium: 5 mmol/L (ref 3.5–5.1)
Sodium: 132 mmol/L — ABNORMAL LOW (ref 135–145)

## 2021-05-11 LAB — CBC
HCT: 25.8 % — ABNORMAL LOW (ref 39.0–52.0)
Hemoglobin: 7.4 g/dL — ABNORMAL LOW (ref 13.0–17.0)
MCH: 23.7 pg — ABNORMAL LOW (ref 26.0–34.0)
MCHC: 28.7 g/dL — ABNORMAL LOW (ref 30.0–36.0)
MCV: 82.7 fL (ref 80.0–100.0)
Platelets: 229 10*3/uL (ref 150–400)
RBC: 3.12 MIL/uL — ABNORMAL LOW (ref 4.22–5.81)
RDW: 16.1 % — ABNORMAL HIGH (ref 11.5–15.5)
WBC: 8 10*3/uL (ref 4.0–10.5)
nRBC: 0 % (ref 0.0–0.2)

## 2021-05-11 LAB — GLUCOSE, CAPILLARY
Glucose-Capillary: 225 mg/dL — ABNORMAL HIGH (ref 70–99)
Glucose-Capillary: 204 mg/dL — ABNORMAL HIGH (ref 70–99)
Glucose-Capillary: 261 mg/dL — ABNORMAL HIGH (ref 70–99)
Glucose-Capillary: 318 mg/dL — ABNORMAL HIGH (ref 70–99)

## 2021-05-11 LAB — PREPARE RBC (CROSSMATCH)

## 2021-05-11 LAB — PROTIME-INR
INR: 1.5 — ABNORMAL HIGH (ref 0.8–1.2)
Prothrombin Time: 17.7 seconds — ABNORMAL HIGH (ref 11.4–15.2)

## 2021-05-11 LAB — HEMOGLOBIN AND HEMATOCRIT, BLOOD
HCT: 29.1 % — ABNORMAL LOW (ref 39.0–52.0)
Hemoglobin: 9.1 g/dL — ABNORMAL LOW (ref 13.0–17.0)

## 2021-05-11 LAB — RETICULOCYTES
Immature Retic Fract: 27.6 % — ABNORMAL HIGH (ref 2.3–15.9)
RBC.: 3.14 MIL/uL — ABNORMAL LOW (ref 4.22–5.81)
Retic Count, Absolute: 46.2 10*3/uL (ref 19.0–186.0)
Retic Ct Pct: 1.5 % (ref 0.4–3.1)

## 2021-05-11 LAB — FERRITIN: Ferritin: 24 ng/mL (ref 24–336)

## 2021-05-11 MED ORDER — ALBUTEROL SULFATE (2.5 MG/3ML) 0.083% IN NEBU
2.5000 mg | INHALATION_SOLUTION | Freq: Once | RESPIRATORY_TRACT | Status: AC
  Administered 2021-05-12: 2.5 mg via RESPIRATORY_TRACT
  Filled 2021-05-11: qty 3

## 2021-05-11 MED ORDER — PANTOPRAZOLE SODIUM 20 MG PO TBEC
20.0000 mg | DELAYED_RELEASE_TABLET | Freq: Every day | ORAL | Status: DC
  Administered 2021-05-11 – 2021-05-15 (×4): 20 mg via ORAL
  Filled 2021-05-11 (×4): qty 1

## 2021-05-11 MED ORDER — UMECLIDINIUM BROMIDE 62.5 MCG/ACT IN AEPB
1.0000 | INHALATION_SPRAY | Freq: Every day | RESPIRATORY_TRACT | Status: DC
  Administered 2021-05-11 – 2021-05-16 (×5): 1 via RESPIRATORY_TRACT
  Filled 2021-05-11: qty 7

## 2021-05-11 MED ORDER — FUROSEMIDE 40 MG PO TABS: 40.0000 mg | ORAL_TABLET | Freq: Every day | ORAL | Status: DC

## 2021-05-11 MED ORDER — SODIUM CHLORIDE 0.9% IV SOLUTION: Freq: Once | INTRAVENOUS | Status: DC

## 2021-05-11 MED ORDER — FUROSEMIDE 10 MG/ML IJ SOLN
40.0000 mg | Freq: Once | INTRAMUSCULAR | Status: AC
  Administered 2021-05-11: 40 mg via INTRAVENOUS
  Filled 2021-05-11: qty 4

## 2021-05-11 NOTE — H&P (View-Only) (Signed)
[facility] SPECIALISTS OF [place] PROGRESS NOTE  ASSESSMENT / PLAN: Rodney [name] is a 71 y.o. 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  Recommend the following which can slow the progression of atherosclerosis and reduce the risk of major adverse cardiac / limb events:  Complete cessation from all tobacco products. Blood glucose control with goal A1c < 7%. Blood pressure control with goal blood pressure < 140/90 mmHg. Lipid reduction therapy with goal LDL-C <100 mg/dL (<70 if symptomatic from PAD).  Aspirin 81mg  PO QD.  Atorvastatin 40-80mg  PO QD (or other "high intensity" statin therapy).  Plan right lower extremity angiogram with possible intervention via left common femoral approach in cath lab tomorrow with Dr. Donzetta Matters. Patient able to lay flat for 5 minutes during my evaluation this morning.   SUBJECTIVE: No complaints. Feeling better from a breathing standpoint. Reports he can lay flat "if he has to."   OBJECTIVE: BP (!) 149/64    Pulse (!) 58    Temp 98.4 F (36.9 C) (Oral)    Resp (!) 23    Ht 6' (1.829 m)    Wt 111 kg    SpO2 97%    BMI 33.19 kg/m   Intake/Output Summary (Last 24 hours) at 05/11/2021 1035 Last data filed at 05/10/2021 2303 Gross per 24 hour  Intake 123.25 ml  Output 900 ml  Net -776.75 ml    No distress Regular rate and rhythm Unlabored breathing 1+ femoral pulses bilaterally.  CBC Latest Ref Rng & Units 05/11/2021 05/10/2021 05/09/2021  WBC 4.0 - 10.5 K/uL 8.0 10.0 -  Hemoglobin 13.0 - 17.0 g/dL 7.4(L) 8.0(L) 7.5(L)  Hematocrit 39.0 - 52.0 % 25.8(L) 26.7(L) 25.7(L)  Platelets 150 - 400 K/uL 229 251 -     CMP Latest Ref Rng & Units 05/11/2021 05/10/2021 05/09/2021  Glucose 70 - 99 mg/dL 347(H) 176(H) 252(H)  BUN 8 - 23 mg/dL 51(H) 46(H) 50(H)  Creatinine 0.61 - 1.24 mg/dL 1.47(H) 1.21 1.67(H)  Sodium 135 - 145 mmol/L 132(L) 135 132(L)  Potassium 3.5 - 5.1 mmol/L 5.0 4.6 4.3   Chloride 98 - 111 mmol/L 98 99 97(L)  CO2 22 - 32 mmol/L 29 28 26   Calcium 8.9 - 10.3 mg/dL 8.3(L) 8.5(L) 8.0(L)  Total Protein 6.5 - 8.1 g/dL - - -  Total Bilirubin 0.3 - 1.2 mg/dL - - -  Alkaline Phos 38 - 126 U/L - - -  AST 15 - 41 U/L - - -  ALT 0 - 44 U/L - - -    Estimated Creatinine Clearance: 61 mL/min (A) (by C-G formula based on SCr of 1.47 mg/dL (H)).  Yevonne Aline. [name], MD [facility] Phone Number: [phone] 05/11/2021 10:35 AM

## 2021-05-11 NOTE — Progress Notes (Addendum)
FPTS Brief Progress Note  S:Patient laying comfortably in bed, no complaints, awaiting procedure tomorrow.    O: BP (!) 156/73 (BP Location: Left Arm)    Pulse 61    Temp 97.7 F (36.5 C) (Oral)    Resp 17    Ht 6' (1.829 m)    Wt 111 kg    SpO2 99%    BMI 33.19 kg/m    PE: Resp: Diffuse wheezing in both lung fields, normal work of breathing, good air movement, no crackles MSK: Pitting edema to just above mid shin, 2+  A/P: -Albuterol neb 2.5 mg x 1 -Lower extremity angiogram procedure tomorrow -Continue pans per day team - Orders reviewed. Labs for AM ordered, which was adjusted as needed.    Holley Bouche, MD 05/11/2021, 9:24 PM PGY-1, [place] [facility] Night Resident  Please page [phone] with questions.

## 2021-05-11 NOTE — Progress Notes (Signed)
FPTS Night Progress Note  S: Patient seen during night time rounds, resting comfortably. No obvious concerns at this time.   O: BP (!) 152/97 (BP Location: Left Arm)    Pulse 65    Temp 97.7 F (36.5 C) (Axillary)    Resp 16    Ht 6' (1.829 m)    Wt 109.1 kg    SpO2 97%    BMI 32.62 kg/m   General: Patient laying flat and sleeping comfortably in bed, in no acute distress. Resp: breathing comfortably, no signs of respiratory distress  A/P: -Continue to monitor fluid status, back on home dose lasix.  -Remainder of plan per day team note.   Donney Dice, DO 05/11/2021, 1:39 AM PGY-2, [place] Medicine Service pager [phone]

## 2021-05-11 NOTE — Progress Notes (Signed)
VASCULAR [facility] VEIN SPECIAL[facility] OF [place] PROGRESS NOTE  ASSESSMENT / PLAN: Rodney [name] is a 71 y.o. 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  Recommend the following which can slow the progression of atherosclerosis and reduce the risk of major adverse cardiac / limb events:  Complete cessation from all tobacco products. Blood glucose control with goal A1c < 7%. Blood pressure control with goal blood pressure < 140/90 mmHg. Lipid reduction therapy with goal LDL-C <100 mg/dL (<70 if symptomatic from PAD).  Aspirin 81mg  PO QD.  Atorvastatin 40-80mg  PO QD (or other "high intensity" statin therapy).  Plan right lower extremity angiogram with possible intervention via left common femoral approach in cath lab tomorrow with Dr. Donzetta Matters. Patient able to lay flat for 5 minutes during my evaluation this morning.   SUBJECTIVE: No complaints. Feeling better from a breathing standpoint. Reports Rodney [name] can lay flat "if Rodney [name] has to."   OBJECTIVE: BP (!) 149/64    Pulse (!) 58    Temp 98.4 F (36.9 C) (Oral)    Resp (!) 23    Ht 6' (1.829 m)    Wt 111 kg    SpO2 97%    BMI 33.19 kg/m   Intake/Output Summary (Last 24 hours) at 05/11/2021 1035 Last data filed at 05/10/2021 2303 Gross per 24 hour  Intake 123.25 ml  Output 900 ml  Net -776.75 ml    No distress Regular rate and rhythm Unlabored breathing 1+ femoral pulses bilaterally.  CBC Latest Ref Rng & Units 05/11/2021 05/10/2021 05/09/2021  WBC 4.0 - 10.5 K/uL 8.0 10.0 -  Hemoglobin 13.0 - 17.0 g/dL 7.4(L) 8.0(L) 7.5(L)  Hematocrit 39.0 - 52.0 % 25.8(L) 26.7(L) 25.7(L)  Platelets 150 - 400 K/uL 229 251 -     CMP Latest Ref Rng & Units 05/11/2021 05/10/2021 05/09/2021  Glucose 70 - 99 mg/dL 347(H) 176(H) 252(H)  BUN 8 - 23 mg/dL 51(H) 46(H) 50(H)  Creatinine 0.61 - 1.24 mg/dL 1.47(H) 1.21 1.67(H)  Sodium 135 - 145 mmol/L 132(L) 135 132(L)  Potassium 3.5 - 5.1 mmol/L 5.0 4.6 4.3   Chloride 98 - 111 mmol/L 98 99 97(L)  CO2 22 - 32 mmol/L 29 28 26   Calcium 8.9 - 10.3 mg/dL 8.3(L) 8.5(L) 8.0(L)  Total Protein 6.5 - 8.1 g/dL - - -  Total Bilirubin 0.3 - 1.2 mg/dL - - -  Alkaline Phos 38 - 126 U/L - - -  AST 15 - 41 U/L - - -  ALT 0 - 44 U/L - - -    Estimated Creatinine Clearance: 61 mL/min (A) (by C-G formula based on SCr of 1.47 mg/dL (H)).  Yevonne Aline. [name], MD [facility] Phone Number: [phone] 05/11/2021 10:35 AM

## 2021-05-11 NOTE — Progress Notes (Signed)
Family Medicine Teaching Service Daily Progress Note Intern Pager: [phone]  Patient name: Rodney [name] Medical record number: [id number] Date of birth: 04/07/1951 Age: 71 y.o. Gender: male  Primary Care Provider: [name], MD Consultants: Vascular surgery Code Status: Full  Pt Overview and Major Events to Date:  1/10 Admitted  Assessment and Plan:  [name] is a 71 yo male presenting with shortness of breath. PMH significant for  HFpEF, HTN, HLD, tobacco use, T2DM, protein calorie malnutrition, PVD, AS, PAF, s/p L BKA, CAD, CKD III, COPD.   Acute Hypoxic Respiratory Failure 2/2 to CHF exacerbation v COPD exacerbation Output 2.325 L after receiving 1x IV 40 mg lasix yesterday. Respiratory status good on 2L O2, able to lay flat on my examination without significant dyspena or lowering O2 sats.  -Lasix held for angiogram today -Daily weights -monitor fluid status, strict I's and O's -Incruse ellipta -nebulizer treatment -wean O2 as tolerated, goal sats 88-92% -monitor respiratory status  CKD Stage 3a Cr 1.44 from 1.47 yesterday. -BMP -hold lasix  Normocytic Anemia Hgb 9.0 MCV 82.7. No signs of bleeding. Could be due to chronic disease or GI losses with chronic anticoagulation. Got 2 U pRBCs yesterday. Haptoglobin pending, LDH 147, retic count 1.5%, ferritin 24. -Outpatient GI work up -transfusion threshold <8 -protonix  PAD CAD R Foot Wounds Vascular surgery performing aortogram today 1/16. -Vascular surgery following, appreciate care -continue plavix -monitor postoperatively  PAF Home medication of warfarin. HR wnl, some HR at high 50s.  -Coreg -amiodarone -hold warfarin, continue heparin   Hyperglycemia T2DM CBGs of 187-318. On recent steroid course.  -CBGs -sSSI -Semglee 15 U -Novolog 5 U  HTN 140s-150s systoly/ 60 to 31S diastolic -Coreg -Monitor  Hyponatremia,chronic Na 130 today. -Monitor on BMP  Hyperkalemia 5.2 today.   -Monitor on BMP  FEN/GI: Heart healthy PPx: heparin Dispo:Home pending clinical improvement  Subjective:  No acute issues ovenright, able to lay down but says not very comfortable. Says his breathing better after treatment. Aortogram planned for today.  Objective: Temp:  [97.4 F (36.3 C)-98.5 F (36.9 C)] 97.6 F (36.4 C) (01/16 0806) Pulse Rate:  [55-63] 60 (01/16 0806) Resp:  [15-25] 18 (01/16 0806) BP: (121-159)/(64-87) 159/69 (01/16 0806) SpO2:  [95 %-100 %] 99 % (01/16 0806) Physical Exam: General: NAD, laying in bed comfortably Cardiovascular: RRR no m/r/g Respiratory: Few wheezes in frontal lung fields, no iWOB on 2L O2 Abdomen: Nontender, soft Extremities: 2+ pitting edema to knee on right leg, left BKA  Laboratory: Recent Labs  Lab 05/10/21 0142 05/11/21 0108 05/11/21 2235 05/12/21 0001  WBC 10.0 8.0  --  9.5  HGB 8.0* 7.4* 9.1* 9.0*  HCT 26.7* 25.8* 29.1* 28.7*  PLT 251 229  --  226   Recent Labs  Lab 05/06/21 1950 05/07/21 0130 05/10/21 0655 05/11/21 0108 05/12/21 0001  NA 138   < > 135 132* 130*  K 4.4   < > 4.6 5.0 5.2*  CL 100   < > 99 98 97*  CO2 29   < > 28 29 26   BUN 25*   < > 46* 51* 54*  CREATININE 1.31*   < > 1.21 1.47* 1.44*  CALCIUM 8.5*   < > 8.5* 8.3* 8.3*  PROT 6.5  --   --   --   --   BILITOT 0.7  --   --   --   --   ALKPHOS 74  --   --   --   --  ALT 11  --   --   --   --   AST 16  --   --   --   --   GLUCOSE 202*   < > 176* 347* 299*   < > = values in this interval not displayed.      Imaging/Diagnostic Tests:   Gerrit Heck, MD 05/12/2021, 9:52 AM PGY-1, [place] Intern pager: [phone], text pages welcome

## 2021-05-11 NOTE — Progress Notes (Signed)
[place] for Warfarin and Heparin Indication: atrial fibrillation  No Known Allergies  Patient Measurements: Height: 6' (182.9 cm) Weight: 111 kg (244 lb 11.4 oz) IBW/kg (Calculated) : 77.6  Heparin Dosing Weight: 100 kg  Vital Signs: Temp: 97.7 F (36.5 C) (01/14 2300) Temp Source: Axillary (01/14 2300) BP: 152/97 (01/14 2300) Pulse Rate: 65 (01/14 2300)  Labs: Recent Labs    05/09/21 0056 05/09/21 0710 05/10/21 0142 05/10/21 0655 05/10/21 1434 05/10/21 2138 05/11/21 0108  HGB 7.9* 7.5* 8.0*  --   --   --  7.4*  HCT 26.7* 25.7* 26.7*  --   --   --  25.8*  PLT 249  --  251  --   --   --  229  LABPROT 24.0*  --  20.6*  --   --   --  17.7*  INR 2.2*  --  1.8*  --   --   --  1.5*  HEPARINUNFRC 0.28*  --  <0.10*  --  0.24* <0.10* 0.31  CREATININE 1.67*  --   --  1.21  --   --  1.47*     Estimated Creatinine Clearance: 61 mL/min (A) (by C-G formula based on SCr of 1.47 mg/dL (H)).   Assessment: 45 YOM with medical history relevant for atrial fibrillation on warfarin PTA who presented with SOB. Pharmacy consulted to manage warfarin and heparin while patient is admitted.  Patient received warfarin 10 mg PO 01/11 at 0200. Warfarin continuing to be on hold due to pending VVS procedure. Plans for surgery on 05/12/21.   Restarted Heparin gtt on 1/14 AM when INR < 2 (1/15 INR 1.5)  Current heparin rate: 1,650 units/hr  Infusion running appropriately in room, Hgb 7.4 (no bleeding noted by patient)  Heparin level: 0.31, therapeutic   Goal of Therapy:  INR 2-3 Heparin level 0.3-0.7 units/ml Monitor platelets by anticoagulation protocol: Yes   Plan:  Continue heparin IV 1650 units/hr Check heparin level and INR daily Continue to monitor H&H, platelets, and s/s of bleeding Continue to hold warfarin due to pending procedure F/u plans for surgery on monday  Thank you for allowing pharmacy to participate in this patient's  care.  Levonne Spiller, PharmD PGY1 Acute Care Resident  05/11/2021,7:12 AM

## 2021-05-11 NOTE — Progress Notes (Addendum)
Family Medicine Teaching Service Daily Progress Note Intern Pager: [phone]  Patient name: Rodney [name] Medical record number: [id number] Date of birth: 1950/06/13 Age: 71 y.o. Gender: male  Primary Care Provider: [name], MD Consultants: Vascular surgery  Code Status: Full   Pt Overview and Major Events to Date:  Admitted: 1/10  Assessment and Plan: Rodney [name] is a 70 yo male  presenting with shortness of breath. PMH is significant for HFpEF, hypertension, HLD, tobacco use, T2DM, protein calorie malnutrition, peripheral vascular disease, aortic stenosis, paroxysmal A. fib, s/p L BKA, CAD, CKD stage III and COPD.    Acute Hypoxic Respiratory Failure secondary to COPD exacerbation vs. CHF exacerbation HFpEF Improving, output measuring 1.6 L and respiratory status stable on 2L. Mild right lower extremity pitting edema noted, reports that this is better than it typically is at home. Noted to have diffuse wheezing on exam.  -lasix 40 mg  -Daily weights -monitor fluid status, strict I/Os -s/p (V solumedrol (1/11) -Prednisone 40 mg (day 4/4) -Continue Incruse Ellipta -nebulizer treatment  -wean O2 as tolerated, with goal O2 sats 88-92% -monitor respiratory status    CKD stage 3a Cr 1.47 with GFR 51. Although increased from yesterday, improved from baseline.  -am BMP -if worsens consider holding diuresis    Hyponatremia  Seems to be chronic and fluctuating. Na 132 this morning, possibly secondary to ongoing fluid overload state.  -am BMP    Normocytic anemia Hgb 7.4 and MCV 82.7, likely secondary to anemia of chronic disease. Transfusion threshold of 8 given history of CAD and PVD. No signs of acute or active bleed at this time.  -ordered 2u pRBCs -follow up post transfusion H&H -follow up haptoglobin, LDH, retic count and ferritin -started protonix -needs colonoscopy screening    Paroxsymal atrial fibrillation Home medications include warfarin. HR within  normal limits. Heparin level appropriate.  -continue coreg -continue amiodarone -hold warfarin, continue heparin gtt pending arteriogram -monitor PT/INR and heparin level    PVD   CAD   Right Foot Wounds VVS to have aortagram and lower extremity arteriorgram on 1/16.  -follow vascular surgery recs  -continue plavix  -NPO at midnight 1/16 order placed -monitor clinically    Hyperglycemia   Type 2 DM  Blood glucose 347, spike likely secondary to steroid course.  -monitor CBGs -sSSI -Semglee 15 units  -Novolog 5 units    HTN Normotensive with occasional hypertensive readings. -continue to monitor BP -continue coreg   FEN/GI: heart healthy  PPx: heparin gtt Dispo:Home pending clinical improvement .   Subjective:  Patient denies any concerns this morning. He inquires about likely going home tomorrow. Discussed needing a transfusion which patient is agreeable to, he denies any source of bleeding that he has noticed.   Objective: Temp:  [97.7 F (36.5 C)-97.9 F (36.6 C)] 97.7 F (36.5 C) (01/14 2300) Pulse Rate:  [60-72] 65 (01/14 2300) Resp:  [14-20] 16 (01/14 2300) BP: (118-152)/(68-97) 152/97 (01/14 2300) SpO2:  [94 %-98 %] 97 % (01/14 2300) Weight:  [109.1 kg] 109.1 kg (01/14 0828) Physical Exam: General: Patient sitting upright in bed, in no acute distress. Cardiovascular: RRR, no murmurs or gallops auscultated  Respiratory: inspiratory wheezing noted diffusely, no evidence of rales or rhonchi, breathing comfortably on 2L without signs of respiratory distress Abdomen: soft, nontender, nondistended, presence of bowel sounds Extremities: right 1+ pitting pedal edema noted, mild pitting edema noted along right LE, left BKA  Laboratory: Recent Labs  Lab 05/09/21 0056 05/09/21 0710  05/10/21 0142 05/11/21 0108  WBC 11.8*  --  10.0 8.0  HGB 7.9* 7.5* 8.0* 7.4*  HCT 26.7* 25.7* 26.7* 25.8*  PLT 249  --  251 229   Recent Labs  Lab 05/06/21 1950 05/07/21 0130  05/09/21 0056 05/10/21 0655 05/11/21 0108  NA 138   < > 132* 135 132*  K 4.4   < > 4.3 4.6 5.0  CL 100   < > 97* 99 98  CO2 29   < > 26 28 29   BUN 25*   < > 50* 46* 51*  CREATININE 1.31*   < > 1.67* 1.21 1.47*  CALCIUM 8.5*   < > 8.0* 8.5* 8.3*  PROT 6.5  --   --   --   --   BILITOT 0.7  --   --   --   --   ALKPHOS 74  --   --   --   --   ALT 11  --   --   --   --   AST 16  --   --   --   --   GLUCOSE 202*   < > 252* 176* 347*   < > = values in this interval not displayed.      Imaging/Diagnostic Tests: No results found.   Donney Dice, DO 05/11/2021, 3:55 AM PGY-2, [place] Intern pager: [phone], text pages welcome

## 2021-05-12 ENCOUNTER — Encounter (HOSPITAL_COMMUNITY)
Admission: EM | Disposition: A | Discharge status: Home / Self Care | Payer: Self-pay | Source: Home / Self Care | Attending: Family Medicine

## 2021-05-12 ENCOUNTER — Encounter (HOSPITAL_COMMUNITY): Payer: Self-pay | Admitting: Vascular Surgery

## 2021-05-12 DIAGNOSIS — T82856A Stenosis of peripheral vascular stent, initial encounter: Secondary | ICD-10-CM

## 2021-05-12 DIAGNOSIS — I70235 Atherosclerosis of native arteries of right leg with ulceration of other part of foot: Secondary | ICD-10-CM

## 2021-05-12 DIAGNOSIS — I739 Peripheral vascular disease, unspecified: Secondary | ICD-10-CM

## 2021-05-12 DIAGNOSIS — R0902 Hypoxemia: Secondary | ICD-10-CM | POA: Diagnosis not present

## 2021-05-12 DIAGNOSIS — L97519 Non-pressure chronic ulcer of other part of right foot with unspecified severity: Secondary | ICD-10-CM

## 2021-05-12 HISTORY — PX: PERIPHERAL VASCULAR INTERVENTION: CATH118257

## 2021-05-12 HISTORY — PX: ABDOMINAL AORTOGRAM W/LOWER EXTREMITY: CATH118223

## 2021-05-12 LAB — BASIC METABOLIC PANEL
Anion gap: 7 (ref 5–15)
BUN: 54 mg/dL — ABNORMAL HIGH (ref 8–23)
CO2: 26 mmol/L (ref 22–32)
Calcium: 8.3 mg/dL — ABNORMAL LOW (ref 8.9–10.3)
Chloride: 97 mmol/L — ABNORMAL LOW (ref 98–111)
Creatinine, Ser: 1.44 mg/dL — ABNORMAL HIGH (ref 0.61–1.24)
GFR, Estimated: 53 mL/min — ABNORMAL LOW (ref >60)
Glucose, Bld: 299 mg/dL — ABNORMAL HIGH (ref 70–99)
Potassium: 5.2 mmol/L — ABNORMAL HIGH (ref 3.5–5.1)
Sodium: 130 mmol/L — ABNORMAL LOW (ref 135–145)

## 2021-05-12 LAB — GLUCOSE, CAPILLARY
Glucose-Capillary: 187 mg/dL — ABNORMAL HIGH (ref 70–99)
Glucose-Capillary: 136 mg/dL — ABNORMAL HIGH (ref 70–99)
Glucose-Capillary: 100 mg/dL — ABNORMAL HIGH (ref 70–99)
Glucose-Capillary: 128 mg/dL — ABNORMAL HIGH (ref 70–99)

## 2021-05-12 LAB — TYPE AND SCREEN
ABO/RH(D): A POS
Antibody Screen: NEGATIVE
Unit division: 0
Unit division: 0

## 2021-05-12 LAB — HAPTOGLOBIN: Haptoglobin: 178 mg/dL (ref 32–363)

## 2021-05-12 LAB — CBC
HCT: 28.7 % — ABNORMAL LOW (ref 39.0–52.0)
Hemoglobin: 9 g/dL — ABNORMAL LOW (ref 13.0–17.0)
MCH: 25.9 pg — ABNORMAL LOW (ref 26.0–34.0)
MCHC: 31.4 g/dL (ref 30.0–36.0)
MCV: 82.7 fL (ref 80.0–100.0)
Platelets: 226 10*3/uL (ref 150–400)
RBC: 3.47 MIL/uL — ABNORMAL LOW (ref 4.22–5.81)
RDW: 15.9 % — ABNORMAL HIGH (ref 11.5–15.5)
WBC: 9.5 10*3/uL (ref 4.0–10.5)
nRBC: 0 % (ref 0.0–0.2)

## 2021-05-12 LAB — BPAM RBC
Blood Product Expiration Date: 202302082359
Blood Product Expiration Date: 202302082359
ISSUE DATE / TIME: 202301150939
ISSUE DATE / TIME: 202301151325
Unit Type and Rh: 6200
Unit Type and Rh: 6200

## 2021-05-12 LAB — POCT ACTIVATED CLOTTING TIME
Activated Clotting Time: 203 seconds
Activated Clotting Time: 167 seconds

## 2021-05-12 LAB — PROTIME-INR
INR: 1.1 (ref 0.8–1.2)
Prothrombin Time: 14.2 seconds (ref 11.4–15.2)

## 2021-05-12 LAB — HEPARIN LEVEL (UNFRACTIONATED): Heparin Unfractionated: 0.47 IU/mL (ref 0.30–0.70)

## 2021-05-12 SURGERY — ABDOMINAL AORTOGRAM W/LOWER EXTREMITY
Anesthesia: LOCAL

## 2021-05-12 MED ORDER — SODIUM CHLORIDE 0.9% FLUSH
3.0000 mL | Freq: Two times a day (BID) | INTRAVENOUS | Status: DC
  Administered 2021-05-13 – 2021-05-16 (×6): 3 mL via INTRAVENOUS

## 2021-05-12 MED ORDER — FENTANYL CITRATE (PF) 100 MCG/2ML IJ SOLN
INTRAMUSCULAR | Status: DC | PRN
  Administered 2021-05-12: 25 ug via INTRAVENOUS

## 2021-05-12 MED ORDER — HEPARIN (PORCINE) IN NACL 1000-0.9 UT/500ML-% IV SOLN
INTRAVENOUS | Status: DC | PRN
  Administered 2021-05-12 (×2): 500 mL

## 2021-05-12 MED ORDER — SODIUM CHLORIDE 0.9 % IV SOLN: INTRAVENOUS | Status: AC

## 2021-05-12 MED ORDER — ALBUTEROL SULFATE (2.5 MG/3ML) 0.083% IN NEBU
2.5000 mg | INHALATION_SOLUTION | Freq: Once | RESPIRATORY_TRACT | Status: DC
Start: 2021-05-12 — End: 2021-05-14
  Filled 2021-05-12: qty 3

## 2021-05-12 MED ORDER — HEPARIN SODIUM (PORCINE) 1000 UNIT/ML IJ SOLN
INTRAMUSCULAR | Status: AC
  Filled 2021-05-12: qty 10

## 2021-05-12 MED ORDER — FENTANYL CITRATE (PF) 100 MCG/2ML IJ SOLN
INTRAMUSCULAR | Status: AC
  Filled 2021-05-12: qty 2

## 2021-05-12 MED ORDER — SODIUM CHLORIDE 0.9% FLUSH: 3.0000 mL | INTRAVENOUS | Status: DC | PRN

## 2021-05-12 MED ORDER — SODIUM CHLORIDE 0.9 % IV SOLN: 250.0000 mL | INTRAVENOUS | Status: DC | PRN

## 2021-05-12 MED ORDER — HYDRALAZINE HCL 20 MG/ML IJ SOLN
5.0000 mg | INTRAMUSCULAR | Status: DC | PRN
  Administered 2021-05-12: 5 mg via INTRAVENOUS

## 2021-05-12 MED ORDER — IODIXANOL 320 MG/ML IV SOLN
INTRAVENOUS | Status: DC | PRN
  Administered 2021-05-12: 100 mL

## 2021-05-12 MED ORDER — ONDANSETRON HCL 4 MG/2ML IJ SOLN: 4.0000 mg | Freq: Four times a day (QID) | INTRAMUSCULAR | Status: DC | PRN

## 2021-05-12 MED ORDER — ALBUTEROL SULFATE (2.5 MG/3ML) 0.083% IN NEBU
2.5000 mg | INHALATION_SOLUTION | Freq: Four times a day (QID) | RESPIRATORY_TRACT | Status: DC
  Administered 2021-05-12 – 2021-05-16 (×16): 2.5 mg via RESPIRATORY_TRACT
  Filled 2021-05-12 (×17): qty 3

## 2021-05-12 MED ORDER — LABETALOL HCL 5 MG/ML IV SOLN: 10.0000 mg | INTRAVENOUS | Status: DC | PRN

## 2021-05-12 MED ORDER — LIDOCAINE HCL (PF) 1 % IJ SOLN
INTRAMUSCULAR | Status: DC | PRN
  Administered 2021-05-12: 15 mL

## 2021-05-12 MED ORDER — FUROSEMIDE 10 MG/ML IJ SOLN
40.0000 mg | Freq: Once | INTRAMUSCULAR | Status: AC
  Administered 2021-05-12: 40 mg via INTRAVENOUS

## 2021-05-12 MED ORDER — ACETAMINOPHEN 325 MG PO TABS: 650.0000 mg | ORAL_TABLET | ORAL | Status: DC | PRN

## 2021-05-12 MED ORDER — FUROSEMIDE 10 MG/ML IJ SOLN
INTRAMUSCULAR | Status: AC
  Filled 2021-05-12: qty 4

## 2021-05-12 MED ORDER — ALBUTEROL SULFATE (2.5 MG/3ML) 0.083% IN NEBU
INHALATION_SOLUTION | RESPIRATORY_TRACT | Status: AC
  Filled 2021-05-12: qty 3

## 2021-05-12 MED ORDER — SODIUM CHLORIDE 0.9 % IV SOLN: INTRAVENOUS | Status: DC

## 2021-05-12 MED ORDER — HEPARIN SODIUM (PORCINE) 1000 UNIT/ML IJ SOLN
INTRAMUSCULAR | Status: DC | PRN
  Administered 2021-05-12: 6000 [IU] via INTRAVENOUS

## 2021-05-12 MED ORDER — HYDRALAZINE HCL 20 MG/ML IJ SOLN
INTRAMUSCULAR | Status: AC
  Filled 2021-05-12: qty 1

## 2021-05-12 MED ORDER — LIDOCAINE HCL (PF) 1 % IJ SOLN
INTRAMUSCULAR | Status: AC
  Filled 2021-05-12: qty 30

## 2021-05-12 SURGICAL SUPPLY — 16 items
BALLN MUSTANG 5.0X40 135 (BALLOONS) ×3
BALLOON MUSTANG 5.0X40 135 (BALLOONS) IMPLANT
CATH OMNI FLUSH 5F 65CM (CATHETERS) ×1 IMPLANT
GLIDEWIRE ADV .035X260CM (WIRE) ×1 IMPLANT
KIT ENCORE 26 ADVANTAGE (KITS) ×1 IMPLANT
KIT MICROPUNCTURE NIT STIFF (SHEATH) ×1 IMPLANT
KIT PV (KITS) ×3 IMPLANT
MAT PREVALON FULL STRYKER (MISCELLANEOUS) ×1 IMPLANT
SHEATH PINNACLE 5F 10CM (SHEATH) ×1 IMPLANT
SHEATH PINNACLE ST 6F 45CM (SHEATH) ×1 IMPLANT
SHEATH PROBE COVER 6X72 (BAG) ×1 IMPLANT
STENT ELUVIA 6X40X130 (Permanent Stent) ×1 IMPLANT
SYR MEDRAD MARK V 150ML (SYRINGE) ×1 IMPLANT
TRANSDUCER W/STOPCOCK (MISCELLANEOUS) ×3 IMPLANT
TRAY PV CATH (CUSTOM PROCEDURE TRAY) ×3 IMPLANT
WIRE BENTSON .035X145CM (WIRE) ×1 IMPLANT

## 2021-05-12 NOTE — Progress Notes (Signed)
Site area: right groin a 6 french long arterial sheath was removed  Site Prior to Removal:  Level 0  Pressure Applied For 35 MINUTES    Bedrest Beginning at 1545pm X 4 hours  Manual:   Yes.    Patient Status During Pull:  stable  Post Pull Groin Site:  Level 0  Post Pull Instructions Given:  Yes.    Post Pull Pulses Present:  Yes.    Dressing Applied:  Yes.    Comments:

## 2021-05-12 NOTE — Progress Notes (Addendum)
Family Medicine Teaching Service Daily Progress Note Intern Pager: [phone]  Patient name: Rodney [name] Medical record number: [id number] Date of birth: [birth year] Age: 71 y.o. Gender: male  Primary Care Provider: [name], MD Consultants: Vascular Surgery Code Status: Full  Pt Overview and Major Events to Date:  1/10 Admitted 1/16 aortogram and R SFA stent   Assessment and Plan:  Rodney [name] is a 71 year old male presenting with shortness of breath.  Past medical history significant for HFpEF, hypertension, HLD, tobacco use, T2DM, protein calorie malnutrition, PVD, AS, PAF, s/p L BKA, CAD, CKD 3, COPD  Acute hypoxic respiratory failure secondary to likely CHF exacerbation Output 1.3 liters, did receive 1 times IV 40 mg Lasix after his vascular procedure.  Respiratory status is comfortable on 3 liters of oxygen saturating in high 90s. Last night received albuterol for wheezing and fevered to 100.4 once without subsequent fevers. Received albuterol at [phone] and feels better.  -home lasix 40 mg -elevate leg -Monitor fever curve -Consider CXR if worsening or continued fevers -Daily weights -Monitor fluid status -Strict I's and O's -albuterol q6h -Incruse Ellipta -Nebulizer treatment -Plan to d/c oxygen, goal sats 88 to 92%, monitor sats -Pulse ox with transferring -Monitor respiratory status  PAD   CAD   right foot wounds Vascular surgery performed aortogram yesterday 1/16 and placed stent and right SFA due to 70% stenosis. Did fever yesterday to 100.4 but has subsequently been afebrile. R foot wounds still present and could be source of fever if continuing. Could also be reactive from surgery. -Vascular surgery following, appreciate care -Continue Plavix -Monitor postoperatively -wound care -elevate leg  CKD stage IIIa Creatinine is 1.19 from 1.44 yesterday.  Received aortogram as well as 1 times Lasix dose. -BMP  Normocytic anemia Hemoglobin 8.6 from  9.0 yesterday.  Did receive aortogram yesterday with stent placement. -Outpatient GI work-up -Transfusion threshold less than 8 -Protonix  PAF Home medication of warfarin.  Heart rates have been within normal limits, 60s to 90s. INR 1.2 today. -Coreg -Amiodarone -Will speak to pharmacy about possibility of [place] warfarin dosing-10 mg today  Hyperglycemia   T2DM Is 77 to 187.  On NPH at home -CBGs -sSSI -Semglee 15 units -NovoLog 5 units TID  Chronic/stable HTN Hyponatremia, chronic Hyperkalemia  FEN/GI: Heart healthy PPx: Heparin Dispo: Home pending clinical improvement likely 1-2 days  Subjective:  [name] did not complain of any acute issues and says he does not feel sick. Feels better after breathing treatment.  Objective: Temp:  [97.4 F (36.3 C)-98.3 F (36.8 C)] 97.9 F (36.6 C) (01/16 1734) Pulse Rate:  [41-91] 78 (01/16 1745) Resp:  [14-32] 22 (01/16 1745) BP: (137-180)/(57-102) 137/72 (01/16 1745) SpO2:  [89 %-100 %] 96 % (01/16 1745) Physical Exam: General: NAD, alert and responsive Cardiovascular: RRR no m/r/g Respiratory: Anterior lung fields with wheezing, posterior lung fields clear Abdomen: nontender to palpation, soft Extremities: RLE edema 2+ to knee, R foot ulcers present, perfused appearance  Laboratory: Recent Labs  Lab 05/10/21 0142 05/11/21 0108 05/11/21 2235 05/12/21 0001  WBC 10.0 8.0  --  9.5  HGB 8.0* 7.4* 9.1* 9.0*  HCT 26.7* 25.8* 29.1* 28.7*  PLT 251 229  --  226   Recent Labs  Lab 05/06/21 1950 05/07/21 0130 05/10/21 0655 05/11/21 0108 05/12/21 0001  NA 138   < > 135 132* 130*  K 4.4   < > 4.6 5.0 5.2*  CL 100   < > 99 98  97*  CO2 29   < > 28 29 26   BUN 25*   < > 46* 51* 54*  CREATININE 1.31*   < > 1.21 1.47* 1.44*  CALCIUM 8.5*   < > 8.5* 8.3* 8.3*  PROT 6.5  --   --   --   --   BILITOT 0.7  --   --   --   --   ALKPHOS 74  --   --   --   --   ALT 11  --   --   --   --   AST 16  --   --    --   --   GLUCOSE 202*   < > 176* 347* 299*   < > = values in this interval not displayed.    Imaging/Diagnostic Tests:   Gerrit Heck, MD 05/12/2021, 6:20 PM PGY-1, [place] Intern pager: [phone], text pages welcome

## 2021-05-12 NOTE — Progress Notes (Signed)
FPTS Brief Progress Note  S:Patient laying comfortably in bed, no complaints. Notes breathing is improved as he had just received a breathing treatment. Patient also expressed concern about paying for inhalers when d/c, and needing nebulizer.   O: BP (!) 161/74 (BP Location: Left Arm)    Pulse 85    Temp 98.5 F (36.9 C) (Oral)    Resp (!) 22    Ht 6' (1.829 m)    Wt 111 kg    SpO2 100%    BMI 33.19 kg/m    PE: Resp: Diffuse wheezing in right lung fields, minimal in left. No crackles heard, good work of breathing, slight breathlessness when talking.  Extremity: 3+ Pitting edema to just above mid shin on RLE, with bandages between toes. No edema in LLE.  Surgical site: Clean bandage, no signs of bleeding  A/P: Acute Hypoxic Respiratory Failure 2/2 to CHF exacerbation v COPD exacerbation Patient volume status appears stable from yesterday. Patient sating above goal on 3 L [place]. Would decreased O2 support, but patient wheezing on exam. Will leave O2 for tonight, for comfort of breathing. -Continue to monitor  Wheezing: Patient with notable wheezing on exam, good work of breathing, slight breathless to speech. Will give albuterol -Albuterol neb -Plans per day team - Orders reviewed. Labs for AM ordered, which was adjusted as needed.    Holley Bouche, MD 05/12/2021, 9:22 PM PGY-1, [place] Medicine Night Resident  Please page [phone] with questions.

## 2021-05-12 NOTE — Progress Notes (Signed)
Received a phone call via the bedside RN to come assess pt and give him a PRN albuterol treatment. Pt is upright with audible exp wheezes, using accessory muscles, and in clear respiratory distress with a NRB on face. Pt is very agitated and talking a mile a minute about how he can't breathe. RT immediately started breathing tx with pt, trying to instruct him to just relax and concentrate on properly taking the medication. Pt continued to be very agitated and aggressive towards the staff while they were trying to help him. RT continued to direct pt back to the albuterol treatment, trying to educate the pt that in his condition, albuterol would really help him. RT also attempted to teach pt about BIPAP therapy in the case the pt needed a little extra respiratory support after the medications were given. Pt still continued to be very agitated and not allow the staff to speak. Pt did seem a little bit more relaxed after the albuterol treatment was given and was then placed on 3L [place]. RN at bedside to witness. RT will continue to monitor.

## 2021-05-12 NOTE — Op Note (Signed)
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 Donzetta Matters, MD Procedure Performed: 1.  Ultrasound-guided cannulation left common femoral artery 2.  Aortogram with right lower extremity runoff 3.  Stent of right SFA with 6 x 40 mm Elluvia  Indications: 71 year old male with a history of previously occluded bypass that was Revised with endovascular bypassing using stent grafts.  He now has recurrent wounds on his foot monophasic flow through the endovascular bypass and evidence of stenosis at the proximal arterial anastomosis.  Findings: There is approximately 70% stenosis at the proximal area of the SFA anastomosis.  Stent was extended up to the distal common femoral artery at completion there was no residual stenosis.  There is runoff via the anterior tibial artery To the ankle there is a tibioperoneal trunk stenosis and then runoff via the posterior tibial after that which also makes it to the ankle.   Procedure:  The patient was identified in the holding area and taken to room 8.  The patient was then placed supine on the table and prepped and draped in the usual sterile fashion.  A time out was called.  Ultrasound was used to evaluate the left comfortable artery.  This was circumferentially calcified.  There is no signs 1% lidocaine cannulated micropuncture needle followed by wire and sheath.  Images saved per record.  Bentson wires placed with a 5 French sheath followed by Omni catheter at the level of L1.  Aortogram was performed.  Crossed the bifurcation from a lower extremity angiography.  With the above findings we placed a Glidewire advantage into the previous stent grafting portion of the bypass.  We placed a long 6 French sheath patient was heparinized.  We primarily stented the proximal anastomosis  with drug-eluting stent this was postdilated with 5 mm balloon.  Completion demonstrated stenosis.  Satisfied with this we retracted our sheath in the left external iliac artery.  This will be pulled in postoperative holding.  Wire was removed.  Patient tolerated procedure well without immediate complication.  Contrast: 100 cc   Vietta Bonifield C. Donzetta Matters, MD [facility] Office: [phone] Pager: [phone]

## 2021-05-12 NOTE — Interval H&P Note (Signed)
History and Physical Interval Note:  05/12/2021 12:01 PM  Rodney [name]  has presented today for surgery, with the diagnosis of peripheral vascular disease.  The various methods of treatment have been discussed with the patient and family. After consideration of risks, benefits and other options for treatment, the patient has consented to  Procedure(s): ABDOMINAL AORTOGRAM W/LOWER EXTREMITY (N/A) as a surgical intervention.  The patient's history has been reviewed, patient examined, no change in status, stable for surgery.  I have reviewed the patient's chart and labs.  Questions were answered to the patient's satisfaction.     Servando Snare

## 2021-05-12 NOTE — Progress Notes (Signed)
Pt arrived to 4e from cath lab. Pt oriented to room and staff. Pt on bedrest until 1945. Pt noncompliant with bedrest and 30 degree HOB limitation. MD aware per cath lab. Left groin site level 0 with gauze dressing. Pt SOB and on 3L nasal cannula. Breathing treatment and IV lasix given in cath lab. Doppler signal present in right PT and pedal sites. Dinner tray ordered, per request.

## 2021-05-12 NOTE — Progress Notes (Signed)
Patient refused BIPAP HS. RT asked patient if he wanted to wear BIPAP tonight and patient said no twice. Pt in no distress at this time.

## 2021-05-12 NOTE — Progress Notes (Signed)
ANTICOAGULATION CONSULT NOTE - Follow Up Consult  Pharmacy Consult for warfarin and heparin Indication: atrial fibrillation  No Known Allergies  Patient Measurements: Height: 6' (182.9 cm) Weight: 111 kg (244 lb 11.4 oz) IBW/kg (Calculated) : 77.6 Heparin Dosing Weight: 100.3 kg  Vital Signs: Temp: 97.6 F (36.4 C) (01/16 0806) Temp Source: Oral (01/16 0806) BP: 159/69 (01/16 0806) Pulse Rate: 60 (01/16 0806)  Labs: Recent Labs    05/10/21 0142 05/10/21 0655 05/10/21 1434 05/10/21 2138 05/11/21 0108 05/11/21 2235 05/12/21 0001  HGB 8.0*  --   --   --  7.4* 9.1* 9.0*  HCT 26.7*  --   --   --  25.8* 29.1* 28.7*  PLT 251  --   --   --  229  --  226  LABPROT 20.6*  --   --   --  17.7*  --  14.2  INR 1.8*  --   --   --  1.5*  --  1.1  HEPARINUNFRC <0.10*  --    < > <0.10* 0.31  --  0.47  CREATININE  --  1.21  --   --  1.47*  --  1.44*   < > = values in this interval not displayed.    Estimated Creatinine Clearance: 62.3 mL/min (A) (by C-G formula based on SCr of 1.44 mg/dL (H)).  Assessment: 76 YOM with medical history relevant for atrial fibrillation on warfarin PTA who presented with SOB. Pharmacy consulted to manage warfarin and heparin while patient is admitted.   Patient received warfarin 10 mg PO 01/11 at 0200. Warfarin continuing to be held pending VVS procedure, planned for 05/12/20 afternoon.   Most recent heparin level therapeutic at 0.47. CBC stable with hgb 9 and platelets 226. No issues noted per chart review.  Goal of Therapy:  INR 2-3 Heparin level 0.3-0.7 units/ml Monitor platelets by anticoagulation protocol: Yes   Plan:  Continue heparin 1650 units/hr Daily heparin level, CBC Monitor for s/sx of bleeding  F/u transition back to warfarin following procedure  Thank you for including pharmacy in the care of this patient.  Zenaida Deed, PharmD PGY1 Acute Care Pharmacy Resident  Phone: [phone] 05/12/2021  4:23 PM  Please check [email or web address]  for unit-specific pharmacy phone numbers.

## 2021-05-13 ENCOUNTER — Other Ambulatory Visit (HOSPITAL_COMMUNITY): Payer: Self-pay

## 2021-05-13 DIAGNOSIS — Z95820 Peripheral vascular angioplasty status with implants and grafts: Secondary | ICD-10-CM

## 2021-05-13 DIAGNOSIS — R0902 Hypoxemia: Secondary | ICD-10-CM | POA: Diagnosis not present

## 2021-05-13 LAB — BASIC METABOLIC PANEL
Anion gap: 9 (ref 5–15)
BUN: 42 mg/dL — ABNORMAL HIGH (ref 8–23)
CO2: 27 mmol/L (ref 22–32)
Calcium: 8.6 mg/dL — ABNORMAL LOW (ref 8.9–10.3)
Chloride: 101 mmol/L (ref 98–111)
Creatinine, Ser: 1.19 mg/dL (ref 0.61–1.24)
GFR, Estimated: >60 mL/min (ref >60)
Glucose, Bld: 100 mg/dL — ABNORMAL HIGH (ref 70–99)
Potassium: 5.1 mmol/L (ref 3.5–5.1)
Sodium: 137 mmol/L (ref 135–145)

## 2021-05-13 LAB — CBC
HCT: 28.7 % — ABNORMAL LOW (ref 39.0–52.0)
Hemoglobin: 8.6 g/dL — ABNORMAL LOW (ref 13.0–17.0)
MCH: 25.1 pg — ABNORMAL LOW (ref 26.0–34.0)
MCHC: 30 g/dL (ref 30.0–36.0)
MCV: 83.7 fL (ref 80.0–100.0)
Platelets: 225 10*3/uL (ref 150–400)
RBC: 3.43 MIL/uL — ABNORMAL LOW (ref 4.22–5.81)
RDW: 16.6 % — ABNORMAL HIGH (ref 11.5–15.5)
WBC: 11.2 10*3/uL — ABNORMAL HIGH (ref 4.0–10.5)
nRBC: 0 % (ref 0.0–0.2)

## 2021-05-13 LAB — GLUCOSE, CAPILLARY
Glucose-Capillary: 77 mg/dL (ref 70–99)
Glucose-Capillary: 92 mg/dL (ref 70–99)
Glucose-Capillary: 148 mg/dL — ABNORMAL HIGH (ref 70–99)
Glucose-Capillary: 168 mg/dL — ABNORMAL HIGH (ref 70–99)

## 2021-05-13 LAB — HEPARIN LEVEL (UNFRACTIONATED)
Heparin Unfractionated: 0.17 IU/mL — ABNORMAL LOW (ref 0.30–0.70)
Heparin Unfractionated: <0.1 IU/mL — ABNORMAL LOW (ref 0.30–0.70)
Heparin Unfractionated: 0.1 IU/mL — ABNORMAL LOW (ref 0.30–0.70)

## 2021-05-13 LAB — HEPATIC FUNCTION PANEL
ALT: 25 U/L (ref 0–44)
AST: 18 U/L (ref 15–41)
Albumin: 2.7 g/dL — ABNORMAL LOW (ref 3.5–5.0)
Alkaline Phosphatase: 55 U/L (ref 38–126)
Bilirubin, Direct: 0.3 mg/dL — ABNORMAL HIGH (ref 0.0–0.2)
Indirect Bilirubin: 0.7 mg/dL (ref 0.3–0.9)
Total Bilirubin: 1 mg/dL (ref 0.3–1.2)
Total Protein: 5.6 g/dL — ABNORMAL LOW (ref 6.5–8.1)

## 2021-05-13 LAB — LIPID PANEL
Cholesterol: 104 mg/dL (ref 0–200)
HDL: 49 mg/dL (ref >40)
LDL Cholesterol: 46 mg/dL (ref 0–99)
Total CHOL/HDL Ratio: 2.1 RATIO
Triglycerides: 43 mg/dL (ref <150)
VLDL: 9 mg/dL (ref 0–40)

## 2021-05-13 LAB — PROTIME-INR
INR: 1.2 (ref 0.8–1.2)
Prothrombin Time: 15.3 seconds — ABNORMAL HIGH (ref 11.4–15.2)

## 2021-05-13 MED ORDER — WARFARIN - PHARMACIST DOSING INPATIENT
Freq: Every day | Status: DC
  Administered 2021-05-13: 1

## 2021-05-13 MED ORDER — WARFARIN SODIUM 10 MG PO TABS
10.0000 mg | ORAL_TABLET | Freq: Once | ORAL | Status: AC
  Administered 2021-05-13: 10 mg via ORAL
  Filled 2021-05-13: qty 1

## 2021-05-13 NOTE — Progress Notes (Signed)
ANTICOAGULATION CONSULT NOTE - Follow Up Consult  Pharmacy Consult for warfarin and heparin Indication: atrial fibrillation  No Known Allergies  Patient Measurements: Height: 6' (182.9 cm) Weight: 111 kg (244 lb 11.4 oz) IBW/kg (Calculated) : 77.6 Heparin Dosing Weight: 100.3 kg  Vital Signs: Temp: 98.2 F (36.8 C) (01/17 0800) Temp Source: Oral (01/17 0800) BP: 140/72 (01/17 0800) Pulse Rate: 65 (01/17 0800)  Labs: Recent Labs    05/11/21 0108 05/11/21 2235 05/12/21 0001 05/13/21 0143 05/13/21 0756  HGB 7.4* 9.1* 9.0* 8.6*  --   HCT 25.8* 29.1* 28.7* 28.7*  --   PLT 229  --  226 225  --   LABPROT 17.7*  --  14.2 15.3*  --   INR 1.5*  --  1.1 1.2  --   HEPARINUNFRC 0.31  --  0.47 0.17* <0.10*  CREATININE 1.47*  --  1.44* 1.19  --     Estimated Creatinine Clearance: 75.4 mL/min (by C-G formula based on SCr of 1.19 mg/dL).  Assessment: 67 YOM with medical history relevant for atrial fibrillation on warfarin PTA who presented with SOB. Pharmacy consulted to manage warfarin and heparin while patient is admitted.   Patient received warfarin 10 mg PO 01/11 at 0200. Warfarin continuing to be held pending VVS procedure, which was done 1/16 int the afternoon. Patient had a stent placed and heparin was restarted 1/16 at 2200.   Most recent heparin level is subtherapeutic at <0.1. Hemoglobin is down slightly at 8.6, platelets are stable at 225. Per RN, no issues with infusion noted overnight. Per patient, his drips were beeping all night. Will plan to order repeat, as patient was therapeutic on this same rate prior to his procedure.  Patient is now starting back on warfarin while continuing to bridge with heparin. Today's INR is 1.2 (goal 2-3).  Home warfarin regimen: 7.5 m on M/W/F; 5 mg on all other days   Goal of Therapy:  INR 2-3 Heparin level 0.3-0.7 units/ml Monitor platelets by anticoagulation protocol: Yes   Plan:  Order repeat heparin level for this  afternoon Continue current rate of heparin 1650 units/hr and reassess after repeat level Warfarin 10 mg x1 Daily heparin level, CBC, INR Monitor for s/sx of bleeding   Thank you for including pharmacy in the care of this patient.  Zenaida Deed, PharmD PGY1 Acute Care Pharmacy Resident  Phone: [phone] 05/13/2021  11:09 AM  Please check [email or web address] for unit-specific pharmacy phone numbers.

## 2021-05-13 NOTE — Progress Notes (Signed)
Patient's right foot clean and dried, betadine applied on the tips of the toes and wound care done based on expired order. Patient might benefit from a continuation of the wound care order as needed.

## 2021-05-13 NOTE — Progress Notes (Signed)
Pt refuses CPAP for the night. Says the mask is not comfortable to him. Pt is not in any distress and vitals are stable. Rt will continue to monitor and needed.

## 2021-05-13 NOTE — TOC Benefit Eligibility Note (Addendum)
Patient Teacher, English as a foreign language completed.    The patient is currently admitted and upon discharge could be taking Eliquis 5 mg.  The current 30 day co-pay is, $312.00 due to a $265.00 deductible remaining.  Will be $47.00 once deductible is met.   The patient is currently admitted and upon discharge could be taking Xarelto 20 mg.  The current 30 day co-pay is, $312.00 due to a $265.00 deductible remaining.  Will be $47.00 once deductible is met.   The patient is currently admitted and upon discharge could be taking Jardiance 10 mg.  The current 30 day co-pay is, $312.00 due to a $265.00 deductible remaining.  Will be $47.00 once deductible is met.   The patient is currently admitted and upon discharge could be taking Levemir Pen.  The current 30 day co-pay is, $35.00  The patient is currently admitted and upon discharge could be taking Semglee Pen.  Non Formulary  The patient is insured through [place] Patient Advocate Specialist [place] Patient Advocate Team Direct Number: [phone]  Fax: [phone]

## 2021-05-13 NOTE — Progress Notes (Signed)
ANTICOAGULATION CONSULT NOTE - Follow Up Consult  Pharmacy Consult for warfarin and heparin Indication: atrial fibrillation  No Known Allergies  Patient Measurements: Height: 6' (182.9 cm) Weight: 111 kg (244 lb 11.4 oz) IBW/kg (Calculated) : 77.6 Heparin Dosing Weight: 100.3 kg  Vital Signs: Temp: 98.6 F (37 C) (01/17 1602) Temp Source: Oral (01/17 1602) BP: 128/59 (01/17 1602) Pulse Rate: 61 (01/17 1602)  Labs: Recent Labs    05/11/21 0108 05/11/21 2235 05/12/21 0001 05/13/21 0143 05/13/21 0756 05/13/21 1431  HGB 7.4* 9.1* 9.0* 8.6*  --   --   HCT 25.8* 29.1* 28.7* 28.7*  --   --   PLT 229  --  226 225  --   --   LABPROT 17.7*  --  14.2 15.3*  --   --   INR 1.5*  --  1.1 1.2  --   --   HEPARINUNFRC 0.31  --  0.47 0.17* <0.10* 0.10*  CREATININE 1.47*  --  1.44* 1.19  --   --      Estimated Creatinine Clearance: 75.4 mL/min (by C-G formula based on SCr of 1.19 mg/dL).  Assessment: 23 YOM with medical history relevant for atrial fibrillation on warfarin PTA who presented with SOB. Pharmacy consulted to manage warfarin and heparin while patient is admitted. Home warfarin regimen: 7.5 m on M/W/F; 5 mg on all other days    Patient received warfarin 10 mg PO 01/11 at 0200. Warfarin continuing to be held pending VVS procedure, which was done 1/16 int the afternoon. Patient had a stent placed and heparin was restarted 1/16 at 2200.   Most recent heparin level continues to be subtherapeutic at 0.1. Per RN, patient removed IV line and she fixed it ~8am and it has been running without issues since then. This is unlikely to be affecting the heparin level, so it is reasonable to increase the rate.  Goal of Therapy:  INR 2-3 Heparin level 0.3-0.7 units/ml Monitor platelets by anticoagulation protocol: Yes   Plan:  Heparin 1950 units/hr  Warfarin 10 mg x1 Daily heparin level, CBC, INR Monitor for s/sx of bleeding    Thank you for including pharmacy in the care of this  patient.  Zenaida Deed, PharmD PGY1 Acute Care Pharmacy Resident  Phone: [phone] 05/13/2021  4:42 PM  Please check [email or web address] for unit-specific pharmacy phone numbers.

## 2021-05-13 NOTE — Progress Notes (Signed)
PHARMACIST LIPID MONITORING   Rodney [name] is a 71 y.o. male admitted on 05/06/2021 with shortness of breath. Patient had an aortogram with stent placement on 1/16.  Pharmacy has been consulted to optimize lipid-lowering therapy with the indication of secondary prevention for clinical ASCVD.  Recent Labs:  Lipid Panel (last 6 months):   Lab Results  Component Value Date   CHOL 104 05/13/2021   TRIG 43 05/13/2021   HDL 49 05/13/2021   CHOLHDL 2.1 05/13/2021   VLDL 9 05/13/2021   LDLCALC 46 05/13/2021    Hepatic function panel (last 6 months):   Lab Results  Component Value Date   AST 18 05/13/2021   ALT 25 05/13/2021   ALKPHOS 55 05/13/2021   BILITOT 1.0 05/13/2021   BILIDIR 0.3 (H) 05/13/2021   IBILI 0.7 05/13/2021    SCr (since admission):   Serum creatinine: 1.19 mg/dL 05/13/21 0143 Estimated creatinine clearance: 75.4 mL/min  Current therapy and lipid therapy tolerance Current lipid-lowering therapy: atorvastatin 80 mg daily Previous lipid-lowering therapies (if applicable): N/A Documented or reported allergies or intolerances to lipid-lowering therapies (if applicable): N/A  Assessment:   Patient is currently on a high intensity statin therapy (atorvastatin 80 mg daily) and his LDL is within goal.   Plan:    1.Statin intensity (high intensity recommended for all patients regardless of the LDL):  No statin changes. The patient is already on a high intensity statin.  2.Add ezetimibe (if any one of the following):   Not indicated at this time.  3.Refer to lipid clinic:   No  4.Follow-up with:  Primary care provider - Hensel, Jamal Collin, MD  5.Follow-up labs after discharge:  No changes in lipid therapy, repeat a lipid panel in one year.      Thank you for including pharmacy in the care of this patient.  Zenaida Deed, PharmD PGY1 Acute Care Pharmacy Resident  Phone: [phone] 05/13/2021  3:52 PM  Please check [email or web address] for unit-specific pharmacy  phone numbers.

## 2021-05-13 NOTE — Care Management Important Message (Signed)
Important Message  Patient Details  Name: GLENFORD GARIS MRN: [id number] Date of Birth: [birth year]   Medicare Important Message Given:  Yes     Shelda Altes 05/13/2021, 8:12 AM

## 2021-05-13 NOTE — Progress Notes (Addendum)
SATURATION QUALIFICATIONS: (This note is used to comply with regulatory documentation for home oxygen)     SATURATION QUALIFICATIONS: (This note is used to comply with regulatory documentation for home oxygen)  Patient Saturations on Room Air at Rest = 91%  Patient Saturations on Room Air while Ambulating = 85%  Patient Saturations on 3 Liters of oxygen while Ambulating = 94%  Please briefly explain why patient needs home oxygen: During transfer to bedside commode patient gets winded and SOB.

## 2021-05-13 NOTE — Progress Notes (Signed)
Pt currently in no distress requiring BiPAP at this time. RT will continue to monitor.

## 2021-05-13 NOTE — Progress Notes (Signed)
[facility]  Subjective  -breathing issues overnight and getting breathing treatment this morning.   Objective 140/72 65 98.2 F (36.8 C) (Oral) 20 99%  Intake/Output Summary (Last 24 hours) at 05/13/2021 0935 Last data filed at 05/13/2021 0800 Gross per 24 hour  Intake 1046.06 ml  Output 1500 ml  Net -453.94 ml    Left groin with no evidence of hematoma Right DP brisk by Doppler    Laboratory Lab Results: Recent Labs    05/12/21 0001 05/13/21 0143  WBC 9.5 11.2*  HGB 9.0* 8.6*  HCT 28.7* 28.7*  PLT 226 225   BMET Recent Labs    05/12/21 0001 05/13/21 0143  NA 130* 137  K 5.2* 5.1  CL 97* 101  CO2 26 27  GLUCOSE 299* 100*  BUN 54* 42*  CREATININE 1.44* 1.19  CALCIUM 8.3* 8.6*    COAG Lab Results  Component Value Date   INR 1.2 05/13/2021   INR 1.1 05/12/2021   INR 1.5 (H) 05/11/2021   No results found for: PTT  Assessment/Planning:  71 year old male underwent a right lower extremity arteriogram yesterday with Dr. Donzetta Matters with evidence of a 70% stenosis in the proximal SFA in setting of previous bypass that was treated with Eluvia stent with good results.  He has brisk DP signal in the right foot.  He does have a blister on the right second toe with some necrotic changes and I discussed the option of a right second toe amputation with him today.  Foot is pictured above.  He has no interest in toe amputation at this time.  Would recommend Betadine paint for wound care and I will arrange follow-up in 3 to 4 weeks for ongoing surveillance.  Would continue plavix statin and he is also on coumadin.  Call vascular with ongoing questions or concerns.  Marty Heck 05/13/2021 9:35 AM --

## 2021-05-13 NOTE — Progress Notes (Signed)
FPTS Brief Progress Note  S:Went to visit patient, patient sleeping comfortably in bed.   O: BP (!) 128/59 (BP Location: Left Arm)    Pulse 67    Temp 98.6 F (37 C) (Oral)    Resp 20    Ht 6' (1.829 m)    Wt 111 kg    SpO2 98%    BMI 33.19 kg/m     A/P: -Plans per day team - Orders reviewed. Labs for AM ordered, which was adjusted as needed.    Holley Bouche, MD 05/13/2021, 8:58 PM PGY-1, [place] Night Resident  Please page [phone] with questions.

## 2021-05-14 ENCOUNTER — Other Ambulatory Visit (HOSPITAL_COMMUNITY): Payer: Self-pay

## 2021-05-14 DIAGNOSIS — D649 Anemia, unspecified: Secondary | ICD-10-CM

## 2021-05-14 DIAGNOSIS — R0902 Hypoxemia: Secondary | ICD-10-CM | POA: Diagnosis not present

## 2021-05-14 DIAGNOSIS — I509 Heart failure, unspecified: Secondary | ICD-10-CM

## 2021-05-14 DIAGNOSIS — J9601 Acute respiratory failure with hypoxia: Secondary | ICD-10-CM

## 2021-05-14 DIAGNOSIS — E119 Type 2 diabetes mellitus without complications: Secondary | ICD-10-CM

## 2021-05-14 LAB — PROTIME-INR
INR: 1.2 (ref 0.8–1.2)
Prothrombin Time: 15.4 seconds — ABNORMAL HIGH (ref 11.4–15.2)

## 2021-05-14 LAB — CBC
HCT: 26.8 % — ABNORMAL LOW (ref 39.0–52.0)
Hemoglobin: 8 g/dL — ABNORMAL LOW (ref 13.0–17.0)
MCH: 24.9 pg — ABNORMAL LOW (ref 26.0–34.0)
MCHC: 29.9 g/dL — ABNORMAL LOW (ref 30.0–36.0)
MCV: 83.5 fL (ref 80.0–100.0)
Platelets: 187 10*3/uL (ref 150–400)
RBC: 3.21 MIL/uL — ABNORMAL LOW (ref 4.22–5.81)
RDW: 16.8 % — ABNORMAL HIGH (ref 11.5–15.5)
WBC: 8.8 10*3/uL (ref 4.0–10.5)
nRBC: 0 % (ref 0.0–0.2)

## 2021-05-14 LAB — GLUCOSE, CAPILLARY
Glucose-Capillary: 155 mg/dL — ABNORMAL HIGH (ref 70–99)
Glucose-Capillary: 143 mg/dL — ABNORMAL HIGH (ref 70–99)
Glucose-Capillary: 200 mg/dL — ABNORMAL HIGH (ref 70–99)
Glucose-Capillary: 203 mg/dL — ABNORMAL HIGH (ref 70–99)

## 2021-05-14 LAB — HEPARIN LEVEL (UNFRACTIONATED)
Heparin Unfractionated: 0.3 IU/mL (ref 0.30–0.70)
Heparin Unfractionated: 0.34 IU/mL (ref 0.30–0.70)

## 2021-05-14 MED ORDER — SODIUM CHLORIDE 0.9 % IV SOLN
250.0000 mg | Freq: Every day | INTRAVENOUS | Status: DC
  Filled 2021-05-14: qty 20

## 2021-05-14 MED ORDER — WARFARIN SODIUM 10 MG PO TABS: 10.0000 mg | ORAL_TABLET | Freq: Once | ORAL | Status: DC

## 2021-05-14 MED ORDER — FUROSEMIDE 40 MG PO TABS
40.0000 mg | ORAL_TABLET | Freq: Every day | ORAL | Status: DC | PRN
  Administered 2021-05-16: 40 mg via ORAL
  Filled 2021-05-14: qty 1

## 2021-05-14 MED ORDER — ALBUTEROL SULFATE (2.5 MG/3ML) 0.083% IN NEBU
2.5000 mg | INHALATION_SOLUTION | Freq: Once | RESPIRATORY_TRACT | Status: AC
  Administered 2021-05-14: 2.5 mg via RESPIRATORY_TRACT
  Filled 2021-05-14: qty 3

## 2021-05-14 MED ORDER — SODIUM CHLORIDE 0.9 % IV SOLN
250.0000 mg | Freq: Every day | INTRAVENOUS | Status: AC
  Administered 2021-05-14 – 2021-05-15 (×2): 250 mg via INTRAVENOUS
  Filled 2021-05-14 (×3): qty 20

## 2021-05-14 NOTE — Progress Notes (Addendum)
Family Medicine Teaching Service Daily Progress Note Intern Pager: [phone]  Patient name: Rodney [name] Medical record number: [id number] Date of birth: [birth year] Age: 71 y.o. Gender: male  Primary Care Provider: [name], MD Consultants: Vascular surgery Code Status: Full  Pt Overview and Major Events to Date:  1/10 intermittent 1/16 aortogram and right SFA stent  Assessment and Plan:  Rodney [name] is a 71 year old male presenting with shortness of breath.  PMH significant for HFpEF, tobacco use, T2DM, protein calorie malnutrition, PVD, I-S, PAF, s/p L BKA, CAD, CKD 3, COPD  Acute hypoxic respiratory failure secondary to likely CHF exacerbation Output was 1.645 L.  Back on his home Lasix.  Respiratory status is comfortable decreased him to room air and was saturating at 93%.  Had ambulatory pulse ox yesterday which showed desaturation to 85% on room air 96% on room air at rest.  Has remained afebrile. -DME home oxygen -home Lasix 40 mg -Elevate leg -Monitor fever curve -Daily weights -Monitor fluid status -Strict I's and O's -Albuterol every 6 hours -Incruse Ellipta -Nebulizer treatment -goal sats 88 to 92%, monitor sats -Pulse ox with transferring -Monitor respiratory status  Normocytic anemia Hemoglobin 8.0 from 8.6 yesterday.  No abdominal pain but likely losses from chronic anticoagulation. Consider GI work-up -Transfusion threshold less than 8 -Protonix -IV Iron x 1 -Start on oral iron after -D/c warfarin/heparin  -consider GI consult  PAD   CAD   right foot wounds Vascular surgery performed aortogram 1/16 with stent placement.  No fevers.  -Vascular surgery signed off, appreciate care -Plavix, statin, warfarin -elevate leg -Wound care  CKD stage IIIa Creatinine was stable yesterday. -Monitor on BMP  PAF Home medication of warfarin.  Large co-pay on DOAC.  Heart rates have been within normal limits. -Coreg -Amiodarone -Warfarin per  pharmacy  Hyperglycemia   T2DM CBG has been 92-155.  NPH would be $35 at home for a 17 day supply.  Levemir would be $35 for a monthly supply.  -CBGs -SSI -Semglee 15 units -NovoLog 5 units 3 times daily  Chronic/stable HTN Hyponatremia Hyperkalemia  FEN/GI: Heart healthy PPx: Heparin Dispo: Home likely today or tomorrow  Subjective:  Denied any issues overnight, had trouble sleeping with respiratory issues but says he feels better after his breathing treatment.  Objective: Temp:  [98 F (36.7 C)-99.3 F (37.4 C)] 99.3 F (37.4 C) (01/18 0349) Pulse Rate:  [61-69] 69 (01/18 0349) Resp:  [16-20] 18 (01/18 0349) BP: (116-140)/(59-79) 125/79 (01/18 0349) SpO2:  [95 %-100 %] 98 % (01/18 0500) FiO2 (%):  [32 %] 32 % (01/18 0500) Physical Exam: General: NAD, resting in bed Cardiovascular: RRR no murmurs rubs or gallops Respiratory: Mild expiratory wheezes in frontal and posterior lung fields bilaterally. Abdomen: Nontender, soft Extremities: Right lower extremity with 2+ edema to knee, foot ulcers appear in healing stages  Laboratory: Recent Labs  Lab 05/12/21 0001 05/13/21 0143 05/14/21 0440  WBC 9.5 11.2* 8.8  HGB 9.0* 8.6* 8.0*  HCT 28.7* 28.7* 26.8*  PLT 226 225 187   Recent Labs  Lab 05/11/21 0108 05/12/21 0001 05/13/21 0143  NA 132* 130* 137  K 5.0 5.2* 5.1  CL 98 97* 101  CO2 29 26 27   BUN 51* 54* 42*  CREATININE 1.47* 1.44* 1.19  CALCIUM 8.3* 8.3* 8.6*  PROT  --   --  5.6*  BILITOT  --   --  1.0  ALKPHOS  --   --  55  ALT  --   --  25  AST  --   --  18  GLUCOSE 347* 299* 100*    Imaging/Diagnostic Tests:   Gerrit Heck, MD 05/14/2021, 7:15 AM PGY-1, [place] Intern pager: [phone], text pages welcome

## 2021-05-14 NOTE — Progress Notes (Addendum)
FPTS Brief Progress Note  S:Went to see patient, patient sleeping comfortably in room. Checked groin site, new bruising that was not present day before. Site slightly TTP, but soft.      O: BP (!) 111/50 (BP Location: Right Arm)    Pulse (!) 58    Temp 98.6 F (37 C) (Oral)    Resp 20    Ht 6' (1.829 m)    Wt 111 kg    SpO2 98%    BMI 33.19 kg/m     A/P: Bruising Site clean and dry, with new bruising extending into the groin area. Site is slightly TTP, but soft. -Continue to monitor -Patient warfarin and heparin being held, Plavix continued -Plans per day team - Orders reviewed. Labs for AM ordered, which was adjusted as needed.   Holley Bouche, MD 05/14/2021, 10:03 PM PGY-1, [place] [facility] Night Resident  Please page [phone] with questions.

## 2021-05-14 NOTE — Progress Notes (Signed)
ANTICOAGULATION CONSULT NOTE - Follow Up Consult  Pharmacy Consult for warfarin and heparin  Indication: atrial fibrillation  No Known Allergies  Patient Measurements: Height: 6' (182.9 cm) Weight: 111 kg (244 lb 11.4 oz) IBW/kg (Calculated) : 77.6 Heparin Dosing Weight: 100.3 kg  Vital Signs: Temp: 99.3 F (37.4 C) (01/18 0349) Temp Source: Oral (01/18 0349) BP: 125/79 (01/18 0349) Pulse Rate: 69 (01/18 0349)  Labs: Recent Labs    05/12/21 0001 05/13/21 0143 05/13/21 0756 05/13/21 1431 05/14/21 0200 05/14/21 0440  HGB 9.0* 8.6*  --   --   --  8.0*  HCT 28.7* 28.7*  --   --   --  26.8*  PLT 226 225  --   --   --  187  LABPROT 14.2 15.3*  --   --  15.4*  --   INR 1.1 1.2  --   --  1.2  --   HEPARINUNFRC 0.47 0.17* <0.10* 0.10* 0.30  --   CREATININE 1.44* 1.19  --   --   --   --     Estimated Creatinine Clearance: 75.4 mL/min (by C-G formula based on SCr of 1.19 mg/dL).  Assessment: 35 YOM with medical history relevant for atrial fibrillation on warfarin PTA who presented with SOB. Pharmacy consulted to manage warfarin and heparin while patient is admitted. Home warfarin regimen: 7.5 m on M/W/F; 5 mg on all other days   Most recent heparin level therapeutic but on the low end of the range at 0.3. Will empirically increase slightly to ensure patient stays within goal range.   INR today continues to be subtherapeutic at 1.2.   Goal of Therapy:  INR 2-3 Heparin level 0.3-0.7 units/ml Monitor platelets by anticoagulation protocol: Yes   Plan:  Increase to heparin 2000 units/hr  Warfarin 10 mg x1 today  Confirmatory heparin level this afternoon Daily heparin level, CBC, INR Monitor for s/sx of bleeding   Thank you for including pharmacy in the care of this patient.  Zenaida Deed, PharmD PGY1 Acute Care Pharmacy Resident  Phone: [phone] 05/14/2021  6:25 AM  Please check [email or web address] for unit-specific pharmacy phone numbers.  _____________  Addendum  05/14/21 4:03 PM:  Confirmatory heparin level was within goal at 0.34. Heparin was then discontinued per the team for possible GI bleed. Warfarin is being held and will not be given tonight.

## 2021-05-14 NOTE — Consult Note (Addendum)
[facility] Consult: 12:34 PM 05/14/2021  LOS: 7 days    Referring Provider: Dr Jinny Sanders, resident  Primary Care Physician:  [name], MD Primary Gastroenterologist:  unassigned    Reason for Consultation:  anemia.  FOBT status TBD.     HPI: Rodney [name] is a 71 y.o. male.  PMH insulin requiring T2 DM.  Obesity.  Peripheral arterial disease.  Critical limb ischemia.  Atrial fibrillation.  CHF (LVEF 50 to 55% by echo 02/2021), CAD.  Demand ischemia.  Severe Aortic stenosis.  RLE DVT/2022.  COPD.  CKD 3.  Proteinuria.  Protein calorie malnutrition.  Substance abuse (tox+ for cocaine, THC in 01/2021) Previous surgeries, interventions include 07/2020 right common iliac stent.  07/2020 R fempopliteal bypass.  Right hallux amputation.  Left BKA.  TEE cardioversion, cardiac catheterization. No previous colonoscopy or upper endoscopy. Meds include chronic warfarin and Plavix.  Currently admitted with hypoxia, respiratory failure attributed to CHF flare. 05/12/2021 abdominal aortogram with R SFA stent to 70% dz at prox SFA anastomosis.  This had been planned as outpt for 1/12.   Anemia dates back as far as 2010, Hgb 9.8 then.  7.3 in 07/2020.  7.8 in early December six weeks ago.  6.9 a week ago at admission.  Reached up to 9 two d ago but back to 8 today.  MCVs, platlets consistently WNL.  INR 1.2.   Stool FOBT negative in 07/2020.   07/2020 noncon CTAP for anemia, r/o bleed: No RP hematoma.  Hematoma present in right groin.  Gallbladder, liver, stomach, large and small bowel unremarkable. Transfusion record of 4 PRBCs in 07/2020, 3 in 10/2020.  Received 4 PRBCs during current admission from 1/11-1/15. 6 or more months ago had a painful, large, bowel movement and his wife saw some streaks of blood on the stool in  the bedside commode.  No bleeding since.  No melena.  Good appetite.  No heartburn.  No dysphagia.  No unexplained weight loss.  Denies unusual or excessive bleeding or bruising.  Retired Building control surveyor, Scientist, product/process development.  Lives with wife in [place].  Does not smoke tobacco.  Admits to smoking marijuana on a regular basis.  No EtOH and no history of heavy alcohol use. Family history pertinent for alcoholic cirrhosis in his father.  His mother was a paraplegic and died with bladder cancer in her early 40s.  No history of anemia, ulcer disease, colorectal cancer.    Past Medical History:  Diagnosis Date   Aortic stenosis    moderate in 2022   Atrial fibrillation (HCC)    CHF (congestive heart failure) ([place])    Coronary artery disease    Diabetes mellitus without complication (HCC)    HLD (hyperlipidemia)    Hypertension    Peripheral arterial disease ([place])     Past Surgical History:  Procedure Laterality Date   ABDOMINAL AORTOGRAM W/LOWER EXTREMITY N/A 08/05/2020   Procedure: ABDOMINAL AORTOGRAM W/LOWER EXTREMITY;  Surgeon: Marty Heck, MD;  Location: [facility];  Service: Cardiovascular;  Laterality: N/A;   ABDOMINAL  AORTOGRAM W/LOWER EXTREMITY N/A 11/13/2020   Procedure: ABDOMINAL AORTOGRAM W/LOWER EXTREMITY;  Surgeon: Cherre Robins, MD;  Location: [facility];  Service: Cardiovascular;  Laterality: N/A;   ABDOMINAL AORTOGRAM W/LOWER EXTREMITY N/A 05/12/2021   Procedure: ABDOMINAL AORTOGRAM W/LOWER EXTREMITY;  Surgeon: Waynetta Sandy, MD;  Location: [place] [facility];  Service: Cardiovascular;  Laterality: N/A;   AMPUTATION Left 09/28/2019   Procedure: AMPUTATION BELOW KNEE;  Surgeon: Newt Minion, MD;  Location: [facility];  Service: Orthopedics;  Laterality: Left;   AMPUTATION Right 11/15/2020   Procedure: RIGHT GREAT TOE AMPUTATION;  Surgeon: Newt Minion, MD;  Location: [facility];  Service: Orthopedics;  Laterality: Right;   CARDIAC CATHETERIZATION      CARDIOVERSION N/A 10/05/2019   Procedure: CARDIOVERSION;  Surgeon: Sanda Klein, MD;  Location: [facility];  Service: Cardiovascular;  Laterality: N/A;   FEMORAL-POPLITEAL BYPASS GRAFT Right 08/07/2020   Procedure: RIGHT FEMORAL TO BELOW KNEE POPLITEAL ARTERY BYPASS;  Surgeon: Waynetta Sandy, MD;  Location: [facility];  Service: Vascular;  Laterality: Right;   LEFT HEART CATH AND CORONARY ANGIOGRAPHY N/A 10/03/2019   Procedure: LEFT HEART CATH AND CORONARY ANGIOGRAPHY;  Surgeon: Lorretta Harp, MD;  Location: [facility];  Service: Cardiovascular;  Laterality: N/A;   PERIPHERAL VASCULAR INTERVENTION Right 08/05/2020   Procedure: PERIPHERAL VASCULAR INTERVENTION;  Surgeon: Marty Heck, MD;  Location: [facility];  Service: Cardiovascular;  Laterality: Right;  common Iliac   PERIPHERAL VASCULAR INTERVENTION Left 11/13/2020   Procedure: PERIPHERAL VASCULAR INTERVENTION;  Surgeon: Cherre Robins, MD;  Location: [facility];  Service: Cardiovascular;  Laterality: Left;   PERIPHERAL VASCULAR INTERVENTION Right 11/14/2020   Procedure: PERIPHERAL VASCULAR INTERVENTION;  Surgeon: Marty Heck, MD;  Location: [facility];  Service: Cardiovascular;  Laterality: Right;  POP/SFA STENT   PERIPHERAL VASCULAR INTERVENTION  05/12/2021   Procedure: PERIPHERAL VASCULAR INTERVENTION;  Surgeon: Waynetta Sandy, MD;  Location: [facility];  Service: Cardiovascular;;   TEE WITHOUT CARDIOVERSION N/A 10/05/2019   Procedure: TRANSESOPHAGEAL ECHOCARDIOGRAM (TEE);  Surgeon: Sanda Klein, MD;  Location: [facility];  Service: Cardiovascular;  Laterality: N/A;    Prior to Admission medications   Medication Sig Start Date End Date Taking? Authorizing Provider  albuterol (VENTOLIN HFA) 108 (90 Base) MCG/ACT inhaler INHALE 2 PUFFS INTO THE LUNGS EVERY 6 (SIX) HOURS AS NEEDED FOR WHEEZING OR SHORTNESS OF BREATH. 04/24/21  Yes Hensel, Jamal Collin, MD   atorvastatin (LIPITOR) 80 MG tablet TAKE 1 TABLET EVERY DAY Patient taking differently: Take 80 mg by mouth daily. 03/11/21  Yes O'Neal, Cassie Freer, MD  dextromethorphan-guaiFENesin [facility] DM) 30-600 MG 12hr tablet Take 1 tablet by mouth 2 (two) times daily. 03/28/21  Yes Lavina Hamman, MD  fluticasone Asencion Islam) 50 MCG/ACT nasal spray Place 2 sprays into both nostrils daily. Patient taking differently: Place 2 sprays into both nostrils daily as needed for allergies. 02/13/21  Yes Simmons-Robinson, Makiera, MD  furosemide (LASIX) 40 MG tablet Take 1 tablet (40 mg total) by mouth daily as needed for fluid (swelling). Patient taking differently: Take 40 mg by mouth daily. 02/13/21  Yes Simmons-Robinson, Makiera, MD  insulin NPH-regular Human (NOVOLIN 70/30 RELION) (70-30) 100 UNIT/ML injection Inject 15-30 Units into the skin See admin instructions. Inject 30 units into the skin with breakfast and 15 units with supper   Yes [provider]  lactose free nutrition (BOOST) LIQD Take 237 mLs by mouth daily.  Yes [provider]  metFORMIN (GLUCOPHAGE) 1000 MG tablet Take 1 tablet (1,000 mg total) by mouth daily. 04/07/21  Yes Lavina Hamman, MD  warfarin (COUMADIN) 5 MG tablet Take 7.47m (one and a half tablets) on Mondays, Wednesdays and Fridays; Take 540m(one tablet) on Tuesdays, Thursdays, Saturdays and Sundays. OR as directed by Warfarin Clinic Patient taking differently: Take 5-7.5 mg by mouth See admin instructions. 7.5 mg Monday,Wednesday and friday  5 mg Tuesday,Thursday,Saturday and sunday 12/11/20  Yes Hensel, WiJamal CollinMD  acetaminophen (TYLENOL) 650 MG CR tablet Take 1,300 mg by mouth in the morning and at bedtime.    [provider]  amiodarone (PACERONE) 200 MG tablet Take 1 tablet (200 mg total) by mouth daily. 10/14/20   HeZenia ResidesMD  benzonatate (TESSALON) 100 MG capsule Take 1 capsule (100 mg total) by mouth 3 (three) times daily. Patient not  taking: Reported on 05/01/2021 03/28/21   PaLavina HammanMD  Blood Glucose Monitoring Suppl (TRUE METRIX METER) DEVI Use to test blood sugar three times daily. Patient not taking: Reported on 05/07/2021 11/20/19   HeZenia ResidesMD  Blood Glucose Monitoring Suppl (TRUE METRIX METER) w/Device KIT USE AS DIRECTED Patient not taking: Reported on 05/07/2021 03/25/20   HeZenia ResidesMD  carvedilol (COREG) 12.5 MG tablet Take 6.25 mg by mouth 2 (two) times daily with a meal. 12/18/20   [provider]  clopidogrel (PLAVIX) 75 MG tablet TAKE 1 TABLET EVERY DAY WITH BREAKFAST Patient taking differently: Take 75 mg by mouth daily. 03/17/21   HeZenia ResidesMD  gabapentin (NEURONTIN) 300 MG capsule Take 600 mg by mouth daily.    [provider]  glucose blood (RELION TRUE METRIX TEST STRIPS) test strip Use to test blood sugar three times per day. Patient not taking: Reported on 05/07/2021 11/20/19   HeZenia ResidesMD  Nutritional Supplements (FEEDING SUPPLEMENT, NEPRO CARB STEADY,) LIQD Take 237 mLs by mouth 2 (two) times daily between meals. Patient not taking: Reported on 05/07/2021 11/22/20   LyEzequiel EssexMD  phenylephrine (NEO-SYNEPHRINE) 1 % nasal spray Place 1 drop into both nostrils every 6 (six) hours as needed for congestion.    [provider]  polyethylene glycol (MIRALAX / GLYCOLAX) 17 g packet Take 17 g by mouth daily as needed for moderate constipation. Patient not taking: Reported on 05/01/2021 03/28/21   PaLavina HammanMD  TRUEplus Lancets 33G MISC Use to test blood sugar three times per day. Patient not taking: Reported on 05/07/2021 11/20/19   HeZenia ResidesMD  umeclidinium bromide (INCRUSE ELLIPTA) 62.5 MCG/INH AEPB Inhale 1 puff into the lungs daily. Patient not taking: Reported on 05/07/2021 12/09/20   HeZenia ResidesMD    Scheduled Meds:  sodium chloride   Intravenous Once   albuterol  2.5 mg Nebulization Q6H   amiodarone  200 mg Oral  Daily   atorvastatin  80 mg Oral Daily   carvedilol  6.25 mg Oral BID WC   clopidogrel  75 mg Oral Daily   feeding supplement (NEPRO CARB STEADY)  237 mL Oral BID BM   fluticasone  2 spray Each Nare Daily   gabapentin  600 mg Oral Daily   insulin aspart  0-9 Units Subcutaneous TID WC   insulin aspart  5 Units Subcutaneous TID WC   insulin glargine-yfgn  15 Units Subcutaneous QHS   pantoprazole  20 mg Oral Daily   sodium chloride flush  3 mL Intravenous Q12H   umeclidinium bromide  1 puff Inhalation Daily   Infusions:  sodium chloride Stopped (05/11/21 1620)   sodium chloride     ferric gluconate (FERRLECIT) IVPB     PRN Meds: sodium chloride, sodium chloride, acetaminophen, albuterol, furosemide, hydrALAZINE, labetalol, ondansetron (ZOFRAN) IV, polyethylene glycol, sodium chloride flush   Allergies as of 05/06/2021   (No Known Allergies)    Family History  Problem Relation Age of Onset   Alcoholism Mother    Alcoholism Father     Social History   Socioeconomic History   Marital status: Widowed    Spouse name: Not on file   Number of children: Not on file   Years of education: Not on file   Highest education level: Not on file  Occupational History   Not on file  Tobacco Use   Smoking status: Every Day    Packs/day: 0.50    Years: 50.00    Pack years: 25.00    Types: Cigarettes   Smokeless tobacco: Never  Vaping Use   Vaping Use: Never used  Substance and Sexual Activity   Alcohol use: Yes    Alcohol/week: 6.0 standard drinks    Types: 6 Standard drinks or equivalent per week    Comment: 11/07/20 - states he has not drank in 6 months   Drug use: No   Sexual activity: Yes    Partners: Female    Comment: monagamous stable relationship  Other Topics Concern   Not on file  Social History Narrative   Not on file   Social Determinants of Health   Financial Resource Strain: Not on file  Food Insecurity: Not on file  Transportation Needs: Not on file   Physical Activity: Not on file  Stress: Not on file  Social Connections: Not on file  Intimate Partner Violence: Not on file    REVIEW OF SYSTEMS: Constitutional: Weakness. ENT:  No nose bleeds.  Bilateral hearing loss Pulm: Short of breath with minor activities such as transferring from wheelchair to bedside commode.  No active cough CV:  No palpitations, no angina.  Chronic RLE edema.  GU:  No hematuria, no frequency GI: See HPI. Heme: Denies unusual or excessive bleeding or bruising.  Does experience purpura on a regular basis. Transfusions: See HPI. Neuro:  No headaches, no peripheral tingling or numbness Derm:  No itching, no rash or sores.  Endocrine:  No sweats or chills.  No polyuria or dysuria Immunization: Reviewed.  Up-to-date on multiple vaccinations including COVID-19 booster. Travel:  None beyond local counties in last few months.    PHYSICAL EXAM: Vital signs in last 24 hours: Vitals:   05/14/21 0933 05/14/21 1149  BP: 104/70 125/67  Pulse: 67 66  Resp: 20 18  Temp: 98.7 F (37.1 C) 98.5 F (36.9 C)  SpO2: 92% 97%   Wt Readings from Last 3 Encounters:  05/11/21 111 kg  03/27/21 105.8 kg  02/13/21 106.4 kg    General: Obese, comfortable, chronically ill-appearing. Head: No facial asymmetry or swelling.  No signs of head trauma Eyes: Conjunctiva slightly pale.  No scleral icterus. Ears: Hard of hearing Nose: No congestion or discharge.  Rhinophyma. Mouth: Dentition fair.  Tongue midline.  Mucosa is moist, pink, clear. Neck: No JVD, no masses, no thyromegaly. Lungs: A few crackles at the bases no wheezing.  No cough.  No dyspnea at rest Heart: Irregular with controlled rate.  Harsh, 2/6 to 3/6 murmur consistent with aortic stenosis.  S1,  S2 present. Abdomen: Soft without tenderness.  Obese.  Active bowel sounds.  Do not appreciate HSM, masses, bruits, hernias..  Left groin w visible bruising and firmness/tenderness.    Rectal: No visible or palpable  hemorrhoids.  No fissures.  Exam glove clean with just slight brown-tinged mucoid material which tests FOBT negative. Musc/Skeltl: No obvious joint swelling or redness. Extremities: Edema with bullous changes of the lower leg and top of foot on the right.  Crusty bullous rash across the top of the right foot.  Dark eschar on right toes.  Amputated right hallux.  Left BKA. Neurologic: Oriented x3.  Appropriate.  Moves all 4 limbs, strength not tested.  No gross deficits.  No tremors Skin: Bullous rash on top of right foot.   Psych: Cooperative, pleasant, no agitation.  Intake/Output from previous day: 01/17 0701 - 01/18 0700 In: 1130.6 [P.O.:954; I.V.:176.6] Out: 1645 [Urine:1645] Intake/Output this shift: Total I/O In: 240 [P.O.:240] Out: 700 [Urine:700]  LAB RESULTS: Recent Labs    05/12/21 0001 05/13/21 0143 05/14/21 0440  WBC 9.5 11.2* 8.8  HGB 9.0* 8.6* 8.0*  HCT 28.7* 28.7* 26.8*  PLT 226 225 187   BMET Lab Results  Component Value Date   NA 137 05/13/2021   NA 130 (L) 05/12/2021   NA 132 (L) 05/11/2021   K 5.1 05/13/2021   K 5.2 (H) 05/12/2021   K 5.0 05/11/2021   CL 101 05/13/2021   CL 97 (L) 05/12/2021   CL 98 05/11/2021   CO2 27 05/13/2021   CO2 26 05/12/2021   CO2 29 05/11/2021   GLUCOSE 100 (H) 05/13/2021   GLUCOSE 299 (H) 05/12/2021   GLUCOSE 347 (H) 05/11/2021   BUN 42 (H) 05/13/2021   BUN 54 (H) 05/12/2021   BUN 51 (H) 05/11/2021   CREATININE 1.19 05/13/2021   CREATININE 1.44 (H) 05/12/2021   CREATININE 1.47 (H) 05/11/2021   CALCIUM 8.6 (L) 05/13/2021   CALCIUM 8.3 (L) 05/12/2021   CALCIUM 8.3 (L) 05/11/2021   LFT Recent Labs    05/13/21 0143  PROT 5.6*  ALBUMIN 2.7*  AST 18  ALT 25  ALKPHOS 55  BILITOT 1.0  BILIDIR 0.3*  IBILI 0.7   PT/INR Lab Results  Component Value Date   INR 1.2 05/14/2021   INR 1.2 05/13/2021   INR 1.1 05/12/2021   Hepatitis Panel No results for input(s): HEPBSAG, HCVAB, HEPAIGM, HEPBIGM in the last 72  hours. C-Diff No components found for: CDIFF Lipase     Component Value Date/Time   LIPASE 23 04/03/2020 1156    Drugs of Abuse     Component Value Date/Time   LABOPIA NONE DETECTED 01/26/2021 1147   COCAINSCRNUR POSITIVE (A) 01/26/2021 1147   LABBENZ NONE DETECTED 01/26/2021 1147   AMPHETMU NONE DETECTED 01/26/2021 1147   THCU POSITIVE (A) 01/26/2021 1147   LABBARB NONE DETECTED 01/26/2021 1147     RADIOLOGY STUDIES: PERIPHERAL VASCULAR CATHETERIZATION  Result Date: 05/12/2021 Images from the original result were not included. Patient name: Rodney [name] MRN: [id number] DOB: 07/13/1950 Sex: male 05/12/2021 Pre-operative Diagnosis: Chronic right lower extremity limb threatening ischemia with toe ulceration Post-operative diagnosis:  Same Surgeon:  Erlene Quan C. Donzetta Matters, MD Procedure Performed: 1.  Ultrasound-guided cannulation left common femoral artery 2.  Aortogram with right lower extremity runoff 3.  Stent of right SFA with 6 x 40 mm Elluvia Indications: 71 year old male with a history of previously occluded bypass that was Revised with endovascular bypassing using stent grafts.  He  now has recurrent wounds on his foot monophasic flow through the endovascular bypass and evidence of stenosis at the proximal arterial anastomosis. Findings: There is approximately 70% stenosis at the proximal area of the SFA anastomosis.  Stent was extended up to the distal common femoral artery at completion there was no residual stenosis.  There is runoff via the anterior tibial artery To the ankle there is a tibioperoneal trunk stenosis and then runoff via the posterior tibial after that which also makes it to the ankle.  Procedure:  The patient was identified in the holding area and taken to room 8.  The patient was then placed supine on the table and prepped and draped in the usual sterile fashion.  A time out was called.  Ultrasound was used to evaluate the left comfortable artery.  This was  circumferentially calcified.  There is no signs 1% lidocaine cannulated micropuncture needle followed by wire and sheath.  Images saved per record.  Bentson wires placed with a 5 French sheath followed by Omni catheter at the level of L1.  Aortogram was performed.  Crossed the bifurcation from a lower extremity angiography.  With the above findings we placed a Glidewire advantage into the previous stent grafting portion of the bypass.  We placed a long 6 French sheath patient was heparinized.  We primarily stented the proximal anastomosis with drug-eluting stent this was postdilated with 5 mm balloon.  Completion demonstrated stenosis.  Satisfied with this we retracted our sheath in the left external iliac artery.  This will be pulled in postoperative holding.  Wire was removed.  Patient tolerated procedure well without immediate complication. Contrast: 100 cc Brandon C. Donzetta Matters, MD [facility] Office: [phone] Pager: [phone]      IMPRESSION:   Normocytic anemia.  Long standing.  S/p 4 PRBCs thus far 1/11- 1/15.  Hgb once agan declining.  FOBT status not ascertained (sample I got today insufficient to true accurate measurement).  Suspect multifactorial: CKD, chronic disease, recent aortogram/RLE stent. Ferrlecit infusion ordered.    Peripheral artery disease.  Previous right iliac stent than right femoropopliteal bypass, right hallux amputation, left BKA. Now s/p 1/16 aortogram and R SFA stent to 70% dz in SFA anastomosis. On chronic Plavix.  Last dose this AM.    History of DVT and A. fib.  Chronic Coumadin.  Last dose was yesterday,  now discontinued. Heparin gtt was also in place and dc'd at 0700 today.  Subtherapeutic INR at 1.2.     IDDM    CHF.  Severe aortic stenosis.  Does not appear to have had cardiology visits/opinions since echo in 02/2021.  Had moderate AS as of latest cards OV in 06/2020.   EF has impro ved from 35 to 40 % to latest 50%.      PLAN:        Await opinion of Dr Silverio Decamp.  Ideally would pursue EGD and colonoscopy but pt at high risk for procedural/anesthesia related complications.   Send stool for FOBT testing.   Consider imaging of abdomen/pelvis to r/O signif post procedural groin hematoma (had this in 07/2020) and RP bleed.     Azucena Freed  05/14/2021, 12:34 PM Phone [phone]   Attending physician's note  I have taken a history, reviewed the chart and examined the patient. I performed a substantive portion of this encounter, including complete performance of at least one of the key components, in conjunction with the APP. I agree with the  APP's note, impression and recommendations.    71 year old gentleman with multiple comorbidities including obesity, type 2 diabetes, CAD, CHF, COPD, CKD, polysubstance abuse (cocaine and THC ), A. fib, severe aortic stenosis and peripheral vascular disease He is s/p abdominal aortogram May 12, 2021 with right SFA stent placement  He has history of chronic anemia, noted decline in hemoglobin on admission which improved s/p transfusion and he again had a decline after vascular stenting  No history of overt GI bleeding other than streaks of bright red blood per rectum when he strained excessively with hard stool No melena or hematochezia  He is on Plavix and chronic warfarin, currently on heparin gtt. s/p vascular stent due to subtherapeutic INR  Patient is extremely high risk for potential complications related to anesthesia and procedure especially given extensive cardiac history including severe aortic stenosis  Continue to manage conservatively with PRBC transfusion as needed  Exclude groin hematoma or RP bleed given recent vascular procedure, obtain CT abdomen pelvis  Use pantoprazole IV twice daily Avoid NSAID's   Will consider EGD and colonoscopy for possible therapeutic intervention if needed if he develops any signs of active GI hemorrhage     The patient was  provided an opportunity to ask questions and all were answered. The patient agreed with the plan and demonstrated an understanding of the instructions.  Damaris Hippo , MD [phone]

## 2021-05-14 NOTE — TOC Benefit Eligibility Note (Addendum)
Patient Teacher, English as a foreign language completed.    The patient is currently admitted and upon discharge could be taking Spiriva HandiHaler 18 mcg inhalation Capsule.  The current 30 day co-pay is, $312.00 due to a $265.00 deductible.  Will be $47.00 once deductible is met.   The patient is currently admitted and upon discharge could be taking Wixela Inhub 250-50 .  The current 30 day co-pay is, $135.30 due to a $265.00 deductible.    The patient is currently admitted and upon discharge could be taking fluticasone/salmeterol (generic) 100-50 .  The current 30 day co-pay is, $135.30 due to a $265.00 deductible.      The patient is insured through [place], [place] Patient Advocate Specialist [place] Patient Advocate Team Direct Number: [phone]  Fax: [phone]

## 2021-05-15 ENCOUNTER — Other Ambulatory Visit (HOSPITAL_COMMUNITY): Payer: Self-pay

## 2021-05-15 DIAGNOSIS — J441 Chronic obstructive pulmonary disease with (acute) exacerbation: Secondary | ICD-10-CM | POA: Diagnosis not present

## 2021-05-15 DIAGNOSIS — I5033 Acute on chronic diastolic (congestive) heart failure: Secondary | ICD-10-CM | POA: Diagnosis not present

## 2021-05-15 DIAGNOSIS — R0902 Hypoxemia: Secondary | ICD-10-CM | POA: Diagnosis not present

## 2021-05-15 LAB — BASIC METABOLIC PANEL
Anion gap: 5 (ref 5–15)
Anion gap: 13 (ref 5–15)
BUN: 47 mg/dL — ABNORMAL HIGH (ref 8–23)
BUN: 45 mg/dL — ABNORMAL HIGH (ref 8–23)
CO2: 31 mmol/L (ref 22–32)
CO2: 28 mmol/L (ref 22–32)
Calcium: 8.3 mg/dL — ABNORMAL LOW (ref 8.9–10.3)
Calcium: 9 mg/dL (ref 8.9–10.3)
Chloride: 100 mmol/L (ref 98–111)
Chloride: 96 mmol/L — ABNORMAL LOW (ref 98–111)
Creatinine, Ser: 1.52 mg/dL — ABNORMAL HIGH (ref 0.61–1.24)
Creatinine, Ser: 1.68 mg/dL — ABNORMAL HIGH (ref 0.61–1.24)
GFR, Estimated: 49 mL/min — ABNORMAL LOW (ref >60)
GFR, Estimated: 44 mL/min — ABNORMAL LOW (ref >60)
Glucose, Bld: 157 mg/dL — ABNORMAL HIGH (ref 70–99)
Glucose, Bld: 182 mg/dL — ABNORMAL HIGH (ref 70–99)
Potassium: 5.2 mmol/L — ABNORMAL HIGH (ref 3.5–5.1)
Potassium: 4.8 mmol/L (ref 3.5–5.1)
Sodium: 136 mmol/L (ref 135–145)
Sodium: 137 mmol/L (ref 135–145)

## 2021-05-15 LAB — CBC
HCT: 28 % — ABNORMAL LOW (ref 39.0–52.0)
Hemoglobin: 8.3 g/dL — ABNORMAL LOW (ref 13.0–17.0)
MCH: 25.1 pg — ABNORMAL LOW (ref 26.0–34.0)
MCHC: 29.6 g/dL — ABNORMAL LOW (ref 30.0–36.0)
MCV: 84.6 fL (ref 80.0–100.0)
Platelets: 189 10*3/uL (ref 150–400)
RBC: 3.31 MIL/uL — ABNORMAL LOW (ref 4.22–5.81)
RDW: 17.1 % — ABNORMAL HIGH (ref 11.5–15.5)
WBC: 7.3 10*3/uL (ref 4.0–10.5)
nRBC: 0 % (ref 0.0–0.2)

## 2021-05-15 LAB — GLUCOSE, CAPILLARY
Glucose-Capillary: 165 mg/dL — ABNORMAL HIGH (ref 70–99)
Glucose-Capillary: 125 mg/dL — ABNORMAL HIGH (ref 70–99)
Glucose-Capillary: 184 mg/dL — ABNORMAL HIGH (ref 70–99)
Glucose-Capillary: 110 mg/dL — ABNORMAL HIGH (ref 70–99)

## 2021-05-15 MED ORDER — FERROUS SULFATE 325 (65 FE) MG PO TABS
325.0000 mg | ORAL_TABLET | ORAL | Status: DC
  Administered 2021-05-16: 325 mg via ORAL
  Filled 2021-05-15: qty 1

## 2021-05-15 MED ORDER — PANTOPRAZOLE SODIUM 40 MG IV SOLR
40.0000 mg | Freq: Two times a day (BID) | INTRAVENOUS | Status: DC
  Administered 2021-05-15 – 2021-05-16 (×3): 40 mg via INTRAVENOUS
  Filled 2021-05-15 (×3): qty 40

## 2021-05-15 NOTE — Consult Note (Signed)
° °  [facility] Inpatient Consult   05/15/2021  Rodney [name] Sep 16, 1950 [id number]  [place] [facility] [ACO] Patient: [name]  Primary Care Provider:  [name], MD, [place], an [name] provider with a chronic care management program and team   Patient screened for hospitalization with noted extreme high risk score for unplanned readmission risk and to assess for potential Chronic Care Management service needs for post hospital transition.  Review of patient's medical record reveals patient is for home. Spoke with patient at bedside phone regarding post hospital follow up needs. Introduced self and reason for referral. Patient states he has someone talking to him now and said  'goodbye'.    Plan: Following with inpatient on 05/14/21 and today, awaiting PT/OT evals as well. Also, spoke with [facility] for support.    For questions contact:   [name], RN BSN [facility] Liaison  [phone] business mobile phone Toll free office [phone]  Fax number: [phone] [email or web address]

## 2021-05-15 NOTE — Progress Notes (Signed)
Pt refused BiPAP tonight. Stated that he will call when he feels like he wants it.

## 2021-05-15 NOTE — Progress Notes (Signed)
Nutrition Follow-up  DOCUMENTATION CODES:   Obesity unspecified, Non-severe (moderate) malnutrition in context of chronic illness  INTERVENTION:   Continue Nepro Shake po BID, each supplement provides 425 kcal and 19 grams protein. Continue MVI with minerals daily.  NUTRITION DIAGNOSIS:   Moderate Malnutrition related to chronic illness (CHF, T2DM) as evidenced by mild fat depletion, moderate muscle depletion.  Ongoing   GOAL:   Patient will meet greater than or equal to 90% of their needs  Progressing   MONITOR:   PO intake, Labs, Weight trends, Supplement acceptance, Skin  REASON FOR ASSESSMENT:   Consult Assessment of nutrition requirement/status, Wound healing  ASSESSMENT:   Pt admitted with hypoxia likely secondary to CHF exacerbation and COPD exacerbation. PMH includes CHF, HTN, HLD, tobacco use, T2DM, PVD, aortic stenosis, afib, s/p L BKA, CAD, CKD III, and COPD.  S/P aortogram and right SFA stent on 1/16.   GI consulted for drop in Hgb. Plans to manage conservatively with PRBC transfusion as needed for now since patient is high risk for complications r/t EGD and colonoscopy.  Patient has a blister with necrotic changes on his R second toe. He does not want it amputated. Vascular surgery is following.   Patient reports good appetite and good intake of meals and supplements. Discussed the importance of adequate protein intake to support healing. Patient likes Nepro and Ensure supplements and agrees to continue drinking 2 per day.   Labs reviewed. K 5.2 CBG: 165-125  Medications reviewed and include ferrous sulfate, Novolog, Semglee, Protonix, ferric gluconate.  Admission weight 108 kg Current weight 107.3 kg  UOP 1495 ml x 24 hours  Diet Order:   Diet Order             Diet Heart Room service appropriate? Yes; Fluid consistency: Thin  Diet effective now                   EDUCATION NEEDS:   Education needs have been addressed  Skin:  Skin  Assessment: Skin Integrity Issues: Skin Integrity Issues:: Incisions Incisions: R great toe amputation; R second toe blister with necrotic changes  Last BM:  1/18  Height:   Ht Readings from Last 1 Encounters:  05/06/21 6' (1.829 m)    Weight:   Wt Readings from Last 1 Encounters:  05/15/21 107.3 kg    Ideal Body Weight:  75.6 kg  BMI:  Body mass index is 32.08 kg/m.  Estimated Nutritional Needs:   Kcal:  2100-2300  Protein:  105-120g  Fluid:  >/=2.1L    Lucas Mallow RD, LDN, CNSC Please refer to Amion for contact information.

## 2021-05-15 NOTE — Progress Notes (Addendum)
Progress Note   Subjective  Chief Complaint: Anemia  Patient lying in bed, just had bowel movement, brown without any evidence of melena or hematochezia. Patient had breakfast this morning, denies nausea, vomiting, abdominal pain. Denies shortness of breath or chest pain. Requesting we will go home    Objective   Vital signs in last 24 hours: Temp:  [98.2 F (36.8 C)-99.4 F (37.4 C)] 98.7 F (37.1 C) (01/19 1156) Pulse Rate:  [58-68] 60 (01/19 1156) Resp:  [20] 20 (01/19 1156) BP: (111-136)/(50-75) 136/52 (01/19 1156) SpO2:  [96 %-99 %] 99 % (01/19 1156) Weight:  [107.3 kg] 107.3 kg (01/19 0500) Last BM Date: 05/14/21  General: Obese male in no acute distress  Heart: Irregular rhythm, systolic harsh murmur Pulm: Clear anteriorly; no wheezing Abdomen:  Soft, Obese AB, active bowel sounds, nontender. Extremities: Left BKA  With edema. Neurologic:  Alert and  oriented x4;  grossly normal neurologically.  Skin:   Dry and intact without significant lesions or rashes. Psychiatric: Cooperative. Normal mood and affect.  Intake/Output from previous day: 01/18 0701 - 01/19 0700 In: 1337.1 [P.O.:1067; IV Piggyback:270.1] Out: 1495 [Urine:1495] Intake/Output this shift: Total I/O In: 237 [P.O.:237] Out: 500 [Urine:500]  Lab Results: Recent Labs    05/13/21 0143 05/14/21 0440 05/15/21 0244  WBC 11.2* 8.8 7.3  HGB 8.6* 8.0* 8.3*  HCT 28.7* 26.8* 28.0*  PLT 225 187 189   BMET Recent Labs    05/13/21 0143 05/15/21 0244  NA 137 136  K 5.1 5.2*  CL 101 100  CO2 27 31  GLUCOSE 100* 157*  BUN 42* 47*  CREATININE 1.19 1.52*  CALCIUM 8.6* 8.3*   LFT Recent Labs    05/13/21 0143  PROT 5.6*  ALBUMIN 2.7*  AST 18  ALT 25  ALKPHOS 55  BILITOT 1.0  BILIDIR 0.3*  IBILI 0.7   PT/INR Recent Labs    05/13/21 0143 05/14/21 0200  LABPROT 15.3* 15.4*  INR 1.2 1.2    Studies/Results: No results found.    Impression/Plan:   Normocytic anemia,  longstanding Suspect multifactorial with CKD, chronic disease, recent aortogram/right lower extremity stent Stable H/H 8.0-->8.3 Overt signs of GI bleeding Bowel movements been brown, denies melena hematochezia. Pending FOBT  Patient very high risk for complications without any clear GI bleeding, continue to manage conservatively.  Continue Protonix twice daily, avoid NSAIDs. Can consider endoscopy and colonoscopy for therapeutic intervention if any signs of active GI hemorrhage.  Please call us back if any signs of active bleeding.  PAD Previous right iliac stent than right femoropopliteal bypass, right hallux amputation, left BKA. Now s/p 1/16 aortogram and R SFA stent to 70% dz in SFA anastomosis.  Was on plavix  History of DVT and A. fib.  Chronic Coumadin.  Last dose was yesterday,  now discontinued.  Heparin gtt was also in place and dc'd at 0700 today.   Subtherapeutic INR at 1.2.   IDDM    CHF.  Severe aortic stenosis.   Does not appear to have had cardiology visits/opinions since echo in 02/2021.   Had moderate AS as of latest cards OV in 06/2020.    EF has impro ved from 35 to 40 % to latest 50%.    Future Appointments  Date Time Provider [place]  06/10/2021  3:30 PM [facility]  07/11/2021  8:40 AM O'Neal, Cassie Freer, MD [facility]      LOS: 8 days   Vladimir Crofts  05/15/2021, 12:04 PM   Attending physician's note   I have taken a history, reviewed the chart and examined the patient. I performed a substantive portion of this encounter, including complete performance of at least one of the key components, in conjunction with the APP. I agree with the APP's note, impression and recommendations.    He is having brown bowel movements with no evidence of active GI bleed  Last night he was noted to have bruising near the vascular catheter insertion site in the groin area, if he has significant decline in hemoglobin without any overt GI  bleeding.  Please consider evaluation to exclude hematoma  No plan for endoscopic evaluation at this point  GI will sign off, available if have any questions  The patient was provided an opportunity to ask questions and all were answered. The patient agreed with the plan and demonstrated an understanding of the instructions.   Damaris Hippo , MD [phone]

## 2021-05-15 NOTE — Progress Notes (Addendum)
Family Medicine Teaching Service Daily Progress Note Intern Pager: [phone]  Patient name: Rodney [name] Medical record number: [id number] Date of birth: 10-09-1950 Age: 71 y.o. Gender: male  Primary Care Provider: [name], MD Consultants: Vascular surgery Code Status: Full  Pt Overview and Major Events to Date:  1/10 admitted 1/16 aortogram and right SFA stent  Assessment and Plan:  Rodney [name] is a 71 year old male presenting with shortness of breath. PMH significant for HFrEF, tobacco use, malnutrition, PVD, AS, PAF, s/p left BKA, CAD, CKD 3, COPD  Acute hypoxic respiratory failure likely secondary to CHF exacerbation/COPD Output overnight was 1.495 L.  Was put back on his home Lasix.  Respiratory status is wheezing while laying down this morning, Needs oxygen when transferring/moving due to desaturations. Will work with pharmacy on getting controller medication patient can afford at home. -Home Lasix 40 mg prn -Daily weights -Monitor fluid status -Strict I's and O's -Albuterol every 6 hours -Incruse Ellipta -Goal sats 88 to 92% -Monitor respiratory status -PT/OT  Normocytic anemia Hemoglobin of 8.3 today from 8.0.  Abdominal pain likely losses from chronic anticoagulation.  GI saw patient yesterday and recommended CT abdomen/pelvis to rule out RP/groin hematoma vascular procedure.  This morning groin site with bruising but no palpable hematoma. S/p IV iron x1. FOBT to be collected.  -GI consulted, appreciate recommendations -F/u FOBT -Transfusion threshold less than 8 -Protonix IV BID  -Avoid NSAIDs -Consider CT abdomen/pelvis tomorrow if hemoglobin worsening -Oral iron -Warfarin/heparin held, restart heparin tomorrow if Hgb improving  PAD   CAD   right foot wound Vascular surgery performed aortogram 1/16 with stent placement.  Groin incision site appears bruised, soft, mildly tender to palpation with cotton bandage at incision site.  -Vascular surgery  signed off, appreciate care -Plavix, statin  -Warfarin held for problem above -Wound care  AKI on CKD IIIa Creatinine worsened to 1.52 from 1.19.  -Monitor on BMP  Hyperkalemia Potassium of 5.2 today. -Monitor on PM BMP  PAF -Coreg -Amiodarone -Warfarin held  Hyperglycemia   T2DM CBGs have been 157-200.  -CBGs -SSI -Semglee 15 units -NovoLog 5 units 3 times daily  Chronic/stable HTN  FEN/GI: Heart Healthy PPx: None currently due to anemia, add heparin tomorrow if Hgb improved Dispo:Home pending clinical improvement 1-2 days  Subjective:  Patient feels his breathing was not great this morning and that the breathing treatments aren't helping that much. He says he has tried Spiriva before and that has helped him  Objective: Temp:  [98.2 F (36.8 C)-99.4 F (37.4 C)] 98.2 F (36.8 C) (01/19 0343) Pulse Rate:  [58-67] 62 (01/19 0343) Resp:  [18-20] 20 (01/19 0343) BP: (104-126)/(50-75) 118/58 (01/19 0343) SpO2:  [92 %-100 %] 98 % (01/19 0343) Weight:  [107.3 kg] 107.3 kg (01/19 0500) Physical Exam: General: NAD, laying in bed comfortably Cardiovascular: RRR no m/r/g Respiratory: Expiratory wheezing in frotnal and posterior lung fields Abdomen: Nontender to palpation, Left groin incision site with some brusing, and mild tenderness to palpation but soft Extremities: RLE edema and wounds on R foot, L BKA  Laboratory: Recent Labs  Lab 05/13/21 0143 05/14/21 0440 05/15/21 0244  WBC 11.2* 8.8 7.3  HGB 8.6* 8.0* 8.3*  HCT 28.7* 26.8* 28.0*  PLT 225 187 189   Recent Labs  Lab 05/12/21 0001 05/13/21 0143 05/15/21 0244  NA 130* 137 136  K 5.2* 5.1 5.2*  CL 97* 101 100  CO2 26 27 31   BUN 54* 42* 47*  CREATININE 1.44* 1.19 1.52*  CALCIUM 8.3* 8.6* 8.3*  PROT  --  5.6*  --   BILITOT  --  1.0  --   ALKPHOS  --  55  --   ALT  --  25  --   AST  --  18  --   GLUCOSE 299* 100* 157*     Imaging/Diagnostic Tests:   Gerrit Heck, MD 05/15/2021, 7:17  AM PGY-1, [place] Intern pager: [phone], text pages welcome

## 2021-05-15 NOTE — Progress Notes (Signed)
FPTS Brief Progress Note  S:Went to see patient, patient laying in bed comfortably, falling asleep.    O: BP (!) 142/67 (BP Location: Right Arm)    Pulse 69    Temp 99.4 F (37.4 C) (Oral)    Resp 20    Ht 6' (1.829 m)    Wt 107.3 kg    SpO2 100%    BMI 32.08 kg/m     A/P: -Plans per day team - Orders reviewed. Labs for AM ordered, which was adjusted as needed.    Holley Bouche, MD 05/15/2021, 10:37 PM PGY-1, [place] Night Resident  Please page [phone] with questions.

## 2021-05-16 ENCOUNTER — Other Ambulatory Visit (HOSPITAL_COMMUNITY): Payer: Self-pay

## 2021-05-16 DIAGNOSIS — D638 Anemia in other chronic diseases classified elsewhere: Secondary | ICD-10-CM

## 2021-05-16 LAB — BASIC METABOLIC PANEL
Anion gap: 6 (ref 5–15)
BUN: 44 mg/dL — ABNORMAL HIGH (ref 8–23)
CO2: 28 mmol/L (ref 22–32)
Calcium: 8.3 mg/dL — ABNORMAL LOW (ref 8.9–10.3)
Chloride: 101 mmol/L (ref 98–111)
Creatinine, Ser: 1.47 mg/dL — ABNORMAL HIGH (ref 0.61–1.24)
GFR, Estimated: 51 mL/min — ABNORMAL LOW (ref >60)
Glucose, Bld: 138 mg/dL — ABNORMAL HIGH (ref 70–99)
Potassium: 5.1 mmol/L (ref 3.5–5.1)
Sodium: 135 mmol/L (ref 135–145)

## 2021-05-16 LAB — GLUCOSE, CAPILLARY
Glucose-Capillary: 113 mg/dL — ABNORMAL HIGH (ref 70–99)
Glucose-Capillary: 108 mg/dL — ABNORMAL HIGH (ref 70–99)

## 2021-05-16 LAB — CBC
HCT: 27.6 % — ABNORMAL LOW (ref 39.0–52.0)
Hemoglobin: 8.3 g/dL — ABNORMAL LOW (ref 13.0–17.0)
MCH: 25.3 pg — ABNORMAL LOW (ref 26.0–34.0)
MCHC: 30.1 g/dL (ref 30.0–36.0)
MCV: 84.1 fL (ref 80.0–100.0)
Platelets: 177 10*3/uL (ref 150–400)
RBC: 3.28 MIL/uL — ABNORMAL LOW (ref 4.22–5.81)
RDW: 17.4 % — ABNORMAL HIGH (ref 11.5–15.5)
WBC: 8.4 10*3/uL (ref 4.0–10.5)
nRBC: 0 % (ref 0.0–0.2)

## 2021-05-16 MED ORDER — PANTOPRAZOLE SODIUM 40 MG PO TBEC
40.0000 mg | DELAYED_RELEASE_TABLET | Freq: Two times a day (BID) | ORAL | 0 refills | Status: DC
  Filled 2021-05-16: qty 60, 30d supply, fill #0

## 2021-05-16 MED ORDER — FERROUS SULFATE 325 (65 FE) MG PO TABS
325.0000 mg | ORAL_TABLET | ORAL | 0 refills | Status: DC
  Filled 2021-05-16: qty 30, 60d supply, fill #0

## 2021-05-16 MED ORDER — INSULIN DETEMIR 100 UNIT/ML FLEXPEN
25.0000 [IU] | PEN_INJECTOR | Freq: Every day | SUBCUTANEOUS | 11 refills | Status: DC
  Filled 2021-05-16: qty 9, 30d supply, fill #0

## 2021-05-16 MED ORDER — INSULIN PEN NEEDLE 32G X 4 MM MISC
0 refills | Status: DC
  Filled 2021-05-16: qty 100, 25d supply, fill #0

## 2021-05-16 NOTE — Care Management Important Message (Signed)
Important Message  Patient Details  Name: Rodney [name] MRN: [id number] Date of Birth: 05-24-1950   Medicare Important Message Given:  Yes     Shelda Altes 05/16/2021, 10:14 AM

## 2021-05-16 NOTE — TOC Transition Note (Signed)
Transition of Care (TOC) - CM/SW Discharge Note [name] RN, BSN Transitions of Care Unit 4E- RN Case Manager See Treatment Team for direct phone #    Patient Details  Name: Rodney [name] MRN: [id number] Date of Birth: April 30, 1950  Transition of Care [facility]) CM/SW Contact:  Dawayne Patricia, RN Phone Number: 05/16/2021, 3:13 PM   Clinical Narrative:    Pt stable for transition home, home 02 has been set up and arranged with [employer]- portable tank delivered to room for transport home.  No further TOC needs noted. Pt has transportation home.    Final next level of care: Home/Self Care Barriers to Discharge: Barriers Resolved   Patient Goals and CMS Choice Patient states their goals for this hospitalization and ongoing recovery are:: return home CMS Medicare.gov Compare Post Acute Care list provided to:: Patient Choice offered to / list presented to : Patient  Discharge Placement               Home        Discharge Plan and Services   Discharge Planning Services: CM Consult Post Acute Care Choice: Durable Medical Equipment, Home Health          DME Arranged: Oxygen DME Agency: [employer] Date DME Agency Contacted: 05/14/21 Time DME Agency Contacted: 79 Representative spoke with at DME Agency: Thedore Mins HH Arranged: NA Industry Agency: NA        Social Determinants of Health ([place]) Interventions     Readmission Risk Interventions Readmission Risk Prevention Plan 05/16/2021 03/26/2021 11/17/2020  Transportation Screening Complete Complete Complete  PCP or Specialist Appt within 3-5 Days - - -  HRI or [place] for [place] - - -  Medication Review [employer] photographer) Complete Complete Complete  PCP or Specialist appointment within 3-5 days of discharge Complete - Complete  HRI or Home Care Consult Complete - Complete  SW Recovery Care/Counseling Consult Complete  Complete Complete  Palliative Care Screening Not Applicable Not Applicable Not [place] Not Applicable Not Applicable Not Applicable  Some recent data might be hidden

## 2021-05-16 NOTE — Progress Notes (Addendum)
SATURATION QUALIFICATIONS: (This note is used to comply with regulatory documentation for home oxygen)  Patient Saturations on Room Air at Rest = 96%  Patient Saturations on Room Air while standing = 85%  Patient Saturations on 3 Liters of oxygen while standing = 91%  Please briefly explain why patient needs home oxygen: During transfer to bedside commode patient gets winded and SOB.

## 2021-05-16 NOTE — Progress Notes (Addendum)
OT Cancellation Note  Patient Details Name: Rodney [name] MRN: [id number] DOB: 08/21/1950   Cancelled Treatment:    Reason Eval/Treat Not Completed: Other (comment)- new order acknowledged, pt declined needs for OT at this time reporting "I'm not jumping through the hoops".  RN reports pt completing BSC transfers with stand by.  If further needs arise please re-consult but OT will sign off at this time.  Jolaine [name], OT Acute Rehabilitation Services Pager [phone] Office [phone]   Delight Stare 05/16/2021, 8:33 AM

## 2021-05-16 NOTE — Progress Notes (Signed)
Family Medicine Teaching Service Daily Progress Note Intern Pager: [phone]  Patient name: JAVONN GAUGER Medical record number: [id number] Date of birth: 1950/08/19 Age: 71 y.o. Gender: male  Primary Care Provider: [name], MD Consultants: Vascular surgery and GI signed off Code Status: Full  Pt Overview and Major Events to Date:  1/10 admitted 1/16 aortogram and right SFA stent  Assessment and Plan: 71 year old male presenting with shortness of breath.  Past medical history significant for HFrEF, tobacco use, PVD, AS, PAF, s/p left BKA, CAD, CKD 3, COPD  Acute hypoxic respiratory failure secondary to CHF exacerbation/COPD Output overnight was 1.505 L.  Did have fever to 100.5 last night but has subsequently had normal temperatures.  Has been saturating well on 2 L oxygen nasal cannula.  Patient says he feels better and is ready to go home. -Getting samples of inhalers -monitor fever curve -Lasix 40 mg as needed -Daily weights -Strict I's and O's -Albuterol every 6 hours -Incruse Ellipta -Goal sats 88 to 92% -Monitor respiratory status -PT/OT  Normocytic anemia Hemoglobin of 8.3 today from 8.3 yesterday.  Does not have any abdominal pain.  GI saw patient yesterday and signed off, recommended considering CT abdomen. FOBT was canceled in epic by RN. -GI has signed off -Transfusion threshold less than 8 -Protonix IV twice daily switch to oral -Avoid NSAIDs -CT abdomen/pelvis not right now -Oral iron -Hold anticoagulation at d/c  PAD   CAD   right foot wound Aortogram 1/16 with stent placement and right SFA. -Plavix, statin -Warfarin held for anemia -Wound care  AKI on CKD 3a, improved Creatinine today was 1.47 from 1.68 yesterday. -Monitor BMP  Hyperkalemia Potassium this morning was 5.1 from 5.2 yesterday. -Monitor on BMP  PAF -Coreg -Amiodarone -Warfarin held  Hyperglycemia   T2DM CBGs have been 113-180s. -CBGs -SSI -Semglee 15  units -NovoLog 5 units 3 times daily  Chronic/stable HTN  FEN/GI: Heart healthy PPx: Holding Dispo: Home today likely  Subjective:  Patient says he is feeling better and ready to go home.  Objective: Temp:  [98.7 F (37.1 C)-100.5 F (38.1 C)] 99.9 F (37.7 C) (01/20 0345) Pulse Rate:  [60-72] 67 (01/20 0345) Resp:  [18-20] 20 (01/20 0345) BP: (129-154)/(52-78) 129/64 (01/19 2318) SpO2:  [90 %-100 %] 96 % (01/20 0345) Weight:  [104.7 kg] 104.7 kg (01/20 0500) Physical Exam: General: NAD, sitting up in bed,  Cardiovascular: Aortic stenosis, normal rate Respiratory: Mild expiratory wheezes bilaterally, no significant dyspnea Abdomen: Nontender to palpation, soft Extremities: Right lower extremity with 1+ edema to knee.  Left lower extremity BKA  Laboratory: Recent Labs  Lab 05/14/21 0440 05/15/21 0244 05/16/21 0200  WBC 8.8 7.3 8.4  HGB 8.0* 8.3* 8.3*  HCT 26.8* 28.0* 27.6*  PLT 187 189 177   Recent Labs  Lab 05/13/21 0143 05/15/21 0244 05/15/21 1606 05/16/21 0200  NA 137 136 137 135  K 5.1 5.2* 4.8 5.1  CL 101 100 96* 101  CO2 27 31 28 28   BUN 42* 47* 45* 44*  CREATININE 1.19 1.52* 1.68* 1.47*  CALCIUM 8.6* 8.3* 9.0 8.3*  PROT 5.6*  --   --   --   BILITOT 1.0  --   --   --   ALKPHOS 55  --   --   --   ALT 25  --   --   --   AST 18  --   --   --   GLUCOSE 100* 157* 182* 138*  Imaging/Diagnostic Tests:   Gerrit Heck, MD 05/16/2021, 7:35 AM PGY-1, [place] Intern pager: [phone], text pages welcome

## 2021-05-16 NOTE — Progress Notes (Signed)
PT Cancellation Note  Patient Details Name: Rodney [name] MRN: [id number] DOB: 07/18/1950   Cancelled Treatment:    Reason Eval/Treat Not Completed: New order acknowledged, pt declined needs for PT at this time reporting, "I'm not jumping through the hoops."  RN reports pt completing BSC transfers with stand by. Acute PT will sign off per patient request; please reconsult if new needs arise.  Mabeline Caras, PT, DPT Acute Rehabilitation Services  Pager [phone] Office [place] 05/16/2021, 10:08 AM

## 2021-05-16 NOTE — Discharge Instructions (Addendum)
Dear Rodney [name],   Thank you so much for allowing [place] to be part of your care!  You were admitted to [facility] for a CHF exacerbation. We discontinued your warfarin because of your blood levels being low. Please follow up with your PCP at the appointment on Monday.  You had a stent placed in your leg by vascular surgery during this hospitalization.  We are giving you samples of Trelegy and inhaler at discharge.  We have switched your insulin to a once a day version called Levemir.   POST-HOSPITAL & CARE INSTRUCTIONS Please follow-up with your PCP on Monday Please let PCP/Specialists know of any changes that were made.  Please see medications section of this packet for any medication changes.   DOCTOR'S APPOINTMENT & FOLLOW UP CARE INSTRUCTIONS  Future Appointments  Date Time Provider [place]  05/19/2021 10:50 AM [name], MD [facility]  06/10/2021  3:30 PM [facility] PA [facility]  07/11/2021  8:40 AM O'Neal, Cassie Freer, MD [facility]    RETURN PRECAUTIONS:   Take care and be well!  [facility]  [place], [place] [address] [phone]

## 2021-05-16 NOTE — Consult Note (Signed)
° °  [facility] Inpatient Consult   05/16/2021  Rodney [name] May 20, 1950 [id number]  Follow up:  Met with patient regarding Chronic Care Management with primary care [[name]].  Patient states, "I could be alright with that from time to time."  Patient endorses [place] for PCP.  He states he has the support of his girlfriend for his home needs and wound care.  Followed up with inpatient [facility] RNCM and wil refer patient for [name] CCM services as high risk patient.  For questions,  [name], RN BSN [facility] Liaison  [phone] business mobile phone Toll free office [phone]  Fax number: [phone] [email or web address]

## 2021-05-16 NOTE — Progress Notes (Signed)
Medication Samples have been provided to the patient.  Drug name: Trelegy Ellipta      Strength: 100 mcg/62.5 mcg/25 mcg         Qty: 2 boxes/28 day supply   LOT: [id number]   Exp.Date: July 2024  Dosing instructions: one inhalation once daily  The patient has been instructed regarding the correct time, dose, and frequency of taking this medication, including desired effects and most common side effects.   Zenaida Deed, PharmD PGY1 Acute Care Pharmacy Resident  Phone: [phone] 05/16/2021  5:38 PM  Please check [email or web address] for unit-specific pharmacy phone numbers.

## 2021-05-16 NOTE — Progress Notes (Signed)
Pt discharged home with significant other. Pt left with all belongings. IV and telemetry box removed. Pt did not want discharge teaching. AVS sent with pt.

## 2021-05-18 NOTE — Discharge Summary (Signed)
[facility] Discharge Summary  Patient name: Rodney [name] Medical record number: [id number] Date of birth: 03-14-1951 Age: 71 y.o. Gender: male Date of Admission: 05/06/2021  Date of Discharge: 05/16/2020 Admitting Physician: [name], MD  Primary Care Provider: [name], MD Consultants: Vascular Surgery, GI  Indication for Hospitalization: Shortness of Breath  Discharge Diagnoses/Problem List:  Anemia of chronic disease COPD exacerbation Hypoxia Acute respiratory failure with hypoxia Acute on chronic heart failure with HFpEF Malnutrition  Disposition: Home   Discharge Condition: Stable  Discharge Exam:  General: NAD, sitting up in bed, alert and responsive Cardiovascular: Aortic stenosis, normal rate Respiratory: Mild expiratory wheezes bilaterally, no significant dyspnea Abdomen: Nontender to palpation, soft Extremities: Right lower extremity with 1+ edema to knee.  Left lower extremity BKA   Brief Hospital Course:  Rodney [name] is a 71 yo male presenting with shortness of breath. PMH significant for  HFpEF, HTN, HLD, tobacco use, T2DM, protein calorie malnutrition, PVD, AS, PAF, s/p L BKA, CAD, CKD III, COPD.   Acute Hypoxic Respiratory Failure   HFpEF   COPD  Patient was brought to ED from home via EMS and was cyanotic, with O2 sats to 70% and poor breath sounds. BNP was 801 CXR showed cardiomegaly with central vascular distention and small left pleural effusion with possible left lower lobe developing infiltrate. Was put on BiPAP in the ED and had bilateral wheezes in anterior lung fields. Was started on azithromycin (1/11-1/13) and ceftriaxone (1/11-1/12) and given steroid course for possible COPD exacerbation (was recently in ICU and treated for COPD exacerbation). Was dosed IV lasix and eventually transitioned to home oral lasix and diuresed net 2 L during hospitalization. At end of hospitalization DME O2 orders were  placed for patient de-satting during transferring. Trelegy inhaler samples were given to patient at discharge.  Normocytic Anemia  Has hemoglobin threshold of 8 and received 4 U pRBCs during hospitalization. Denied any acute bleeding or abdominal pain but can consider slow GI losses with chronic anticoagulation vs. Anemia of chronic disease. Hgb at end of hospitalization was 8.3. GI was consulted and recommended IV protonix BID, avoiding NSAIDs and managing conservatively with pRBC transfusion as needed. Was high risk for complications with anesthesia given cardiac hx. Unlikely to be related to groin hematoma after a vascular procedure given no palpable hematoma and just bruising with hemoglobin remaining stable.   PAF on Warfarin Remained rate controlled during hospitalization. Was transitioned to heparin for vasular aortogram during hospitalization. He was then taken off heparin and warfarin due to risk of bleeding with a decreasing hemoglobin level. HAS-BLED of 3-4 high risk of bleeding. GARFIELD-AF Risk Calculator showed Major Bleeding Incl. Haemorrhagic Stroke for VKA 6.4%.  PVD   CAD   Right Foot Wounds On exam patient's foot has some bullous lesions on the dorsal surface as well as some ulcers with clean bases around the toes. Did not have any purulent drainage or infected looking ulcers. Also had significant venous stasis and 3+ edema on right leg on admission. Ultrasound-guided cannulation left common femoral artery and aortogram with right lower extremity runoff with stent of right SFA (70% stenosis at proximal area of SFA anastomosis). Vascular surgery recommended continuing plavix, statin and warfarin (d/c'd because of problem above)  Chronic conditions remained stable   Issues for Follow Up:  Trelegy samples given at d/c for patient-will need long term plan given affordability for inhalers GI follow up for anemia, possible slow GI loss from  chronic anticoagulation.  Repeat Hemoglobin at  PCP visit (warfarin was stopped for bleeding risk) Patie  Significant Procedures: 1/16 aortogram and right SFA stent  Significant Labs and Imaging:  Recent Labs  Lab 05/14/21 0440 05/15/21 0244 05/16/21 0200  WBC 8.8 7.3 8.4  HGB 8.0* 8.3* 8.3*  HCT 26.8* 28.0* 27.6*  PLT 187 189 177   Recent Labs  Lab 05/12/21 0001 05/13/21 0143 05/15/21 0244 05/15/21 1606 05/16/21 0200  NA 130* 137 136 137 135  K 5.2* 5.1 5.2* 4.8 5.1  CL 97* 101 100 96* 101  CO2 26 27 31 28 28   GLUCOSE 299* 100* 157* 182* 138*  BUN 54* 42* 47* 45* 44*  CREATININE 1.44* 1.19 1.52* 1.68* 1.47*  CALCIUM 8.3* 8.6* 8.3* 9.0 8.3*  ALKPHOS  --  55  --   --   --   AST  --  18  --   --   --   ALT  --  25  --   --   --   ALBUMIN  --  2.7*  --   --   --       Results/Tests Pending at Time of Discharge:   Discharge Medications:  Allergies as of 05/16/2021   No Known Allergies      Medication List     STOP taking these medications    benzonatate 100 MG capsule Commonly known as: TESSALON   dextromethorphan-guaiFENesin 30-600 MG 12hr tablet Commonly known as: MUCINEX DM   NovoLIN 70/30 ReliOn (70-30) 100 UNIT/ML injection Generic drug: insulin NPH-regular Human   phenylephrine 1 % nasal spray Commonly known as: NEO-SYNEPHRINE   umeclidinium bromide 62.5 MCG/INH Aepb Commonly known as: INCRUSE ELLIPTA   warfarin 5 MG tablet Commonly known as: COUMADIN       TAKE these medications    acetaminophen 650 MG CR tablet Commonly known as: TYLENOL Take 1,300 mg by mouth in the morning and at bedtime.   albuterol 108 (90 Base) MCG/ACT inhaler Commonly known as: VENTOLIN HFA INHALE 2 PUFFS INTO THE LUNGS EVERY 6 (SIX) HOURS AS NEEDED FOR WHEEZING OR SHORTNESS OF BREATH.   amiodarone 200 MG tablet Commonly known as: PACERONE Take 1 tablet (200 mg total) by mouth daily.   atorvastatin 80 MG tablet Commonly known as: LIPITOR TAKE 1 TABLET EVERY DAY   BD Pen Needle Nano U/F 32G X 4 MM  Misc Generic drug: Insulin Pen Needle Use to inject insulin up to 4 times daily as directed,   carvedilol 12.5 MG tablet Commonly known as: COREG Take 6.25 mg by mouth 2 (two) times daily with a meal.   clopidogrel 75 MG tablet Commonly known as: PLAVIX TAKE 1 TABLET EVERY DAY WITH BREAKFAST What changed: See the new instructions.   lactose free nutrition Liqd Take 237 mLs by mouth daily.   feeding supplement (NEPRO CARB STEADY) Liqd Take 237 mLs by mouth 2 (two) times daily between meals.   FeroSul 325 (65 FE) MG tablet Generic drug: ferrous sulfate Take 1 tablet (325 mg total) by mouth every other day.   fluticasone 50 MCG/ACT nasal spray Commonly known as: FLONASE Place 2 sprays into both nostrils daily. What changed:  when to take this reasons to take this   furosemide 40 MG tablet Commonly known as: LASIX Take 1 tablet (40 mg total) by mouth daily as needed for fluid (swelling). What changed: when to take this   gabapentin 300 MG capsule Commonly known as: NEURONTIN Take 600 mg  by mouth daily.   Levemir FlexTouch 100 UNIT/ML FlexPen Generic drug: insulin detemir Inject 25 Units into the skin daily.   metFORMIN 1000 MG tablet Commonly known as: GLUCOPHAGE Take 1 tablet (1,000 mg total) by mouth daily.   pantoprazole 40 MG tablet Commonly known as: Protonix Take 1 tablet (40 mg total) by mouth 2 (two) times daily.   polyethylene glycol 17 g packet Commonly known as: MIRALAX / GLYCOLAX Take 17 g by mouth daily as needed for moderate constipation.   ReliOn True Metrix Test Strips test strip Generic drug: glucose blood Use to test blood sugar three times per day.   True Metrix Meter Devi Use to test blood sugar three times daily.   True Metrix Meter w/Device Kit USE AS DIRECTED   TRUEplus Lancets 33G Misc Use to test blood sugar three times per day.        Discharge Instructions: Please refer to Patient Instructions section of EMR for full  details.  Patient was counseled important signs and symptoms that should prompt return to medical care, changes in medications, dietary instructions, activity restrictions, and follow up appointments.   Follow-Up Appointments:  Follow-up Information     [facility] Follow up.   Why: home 02 arranged- portable tank for transport to be delivered to room prior to discharge Contact information: [address] [place] [address] [phone]         Marty Heck, MD Follow up in 1 month(s).   Specialty: Vascular Surgery Why: Office will call you to arrange your appt (sent) Contact information: [place] [address] [phone]                 Gerrit Heck, MD 05/18/2021, 1:30 PM PGY-1, [place]

## 2021-05-19 ENCOUNTER — Encounter: Payer: Self-pay | Admitting: Family Medicine

## 2021-05-19 ENCOUNTER — Telehealth: Payer: Self-pay | Admitting: *Deleted

## 2021-05-19 ENCOUNTER — Ambulatory Visit (INDEPENDENT_AMBULATORY_CARE_PROVIDER_SITE_OTHER): Payer: Medicare PPO | Admitting: Family Medicine

## 2021-05-19 ENCOUNTER — Other Ambulatory Visit: Payer: Self-pay

## 2021-05-19 DIAGNOSIS — J9611 Chronic respiratory failure with hypoxia: Secondary | ICD-10-CM | POA: Diagnosis not present

## 2021-05-19 DIAGNOSIS — E1122 Type 2 diabetes mellitus with diabetic chronic kidney disease: Secondary | ICD-10-CM | POA: Diagnosis not present

## 2021-05-19 DIAGNOSIS — I5033 Acute on chronic diastolic (congestive) heart failure: Secondary | ICD-10-CM

## 2021-05-19 DIAGNOSIS — N183 Chronic kidney disease, stage 3 unspecified: Secondary | ICD-10-CM

## 2021-05-19 DIAGNOSIS — I350 Nonrheumatic aortic (valve) stenosis: Secondary | ICD-10-CM | POA: Diagnosis not present

## 2021-05-19 DIAGNOSIS — D649 Anemia, unspecified: Secondary | ICD-10-CM

## 2021-05-19 DIAGNOSIS — I739 Peripheral vascular disease, unspecified: Secondary | ICD-10-CM | POA: Diagnosis not present

## 2021-05-19 DIAGNOSIS — I5020 Unspecified systolic (congestive) heart failure: Secondary | ICD-10-CM | POA: Diagnosis not present

## 2021-05-19 DIAGNOSIS — Z89512 Acquired absence of left leg below knee: Secondary | ICD-10-CM

## 2021-05-19 MED ORDER — IPRATROPIUM-ALBUTEROL 0.5-2.5 (3) MG/3ML IN SOLN: 3.0000 mL | RESPIRATORY_TRACT | 3 refills | Status: DC | PRN

## 2021-05-19 MED ORDER — TRAZODONE HCL 50 MG PO TABS: 25.0000 mg | ORAL_TABLET | Freq: Every evening | ORAL | 3 refills | Status: DC | PRN

## 2021-05-19 NOTE — Patient Instructions (Signed)
As we discussed, I am worried about you.  We will keep working through this the best we can together.   I will call tomorrow with blood test results.

## 2021-05-19 NOTE — Chronic Care Management (AMB) (Signed)
°  Care Management   Outreach Note  05/19/2021 Name: Rodney [name] MRN: [id number] DOB: Dec 28, 1950  Referred by: [name], MD Reason for referral : Care Coordination (Initial outreach to schedule referral with [facility])   A telephone outreach was attempted today. The patient was referred to the case management team for assistance with care management and care coordination.   Follow Up Plan:  The care management team will reach out to the patient again over the next 7 days.  If patient returns call to provider office, please advise to call [place]* at [phone].*  [place] Management  Direct Dial: [phone]

## 2021-05-19 NOTE — Progress Notes (Signed)
° ° °  SUBJECTIVE:   CHIEF COMPLAINT / HPI:   FU hospitalization: Multiple issues Anemia from acute GI blood loss.  No gross bleeding.  Only on plavix for Severe PVD.  No longer on coumadin for PAF: Risk>benefit. HFrEF.  He has dyspnea on exertion and swelling.  Unclear what his dry weight.  Hospital course was complicated by AKI on CKD presumed secondary to ACE and diuresis.  Currently on lasix daily. Chronic hypoxic resp failure.  On O2.  Still needs nebulizer.  I ordered again. Difficult social situation: I am now aware that he qualifies for chronic care management services.  Order placed.  He has issues with transportation and affording meds.  Has all DC meds.    Severe AS: another brick in the wall of his heavy burden of chronic disease.  No chest pain or syncope.  Not a surgical candidate. PVD.  Followed by [facility].  Denies problem with left stump (S/P BKA)  Right foot has some improvement post revascularization procedure.  Still at high risk for infection and ischemia.    OBJECTIVE:   BP 128/85    Pulse (!) 58    Ht 6' (1.829 m)    Wt 234 lb (106.1 kg)    SpO2 97%    BMI 31.74 kg/m   Lungs clear Cardiac 2/6 SEM.   Abd benign Rt leg 2+ edema.  Some skin breakdown of remaining toes.  Improved since hospital DC.  ASSESSMENT/PLAN:   Acute on chronic anemia Stable on iron.  Continue no coumadin.  Continue plavix.  Severe aortic stenosis AS now severe which adds to his chronic disease burden.  PVD (peripheral vascular disease) ([place]) Followed by [facility].  I explained that he has both macrovascular and microvascular disease.  Procedures only help the macrovascular disease.  Chronic respiratory failure with hypoxia (HCC) Continue O2 and inhaler.  Ordered nebulizer and duonebs.    Acute on chronic heart failure with preserved ejection fraction (HFpEF) (HCC) Unclear dry weight.  Avoid over diuresis to avoid AKI.  Continue lasix at current dose.  S/P BKA (below knee amputation) unilateral,  left (HCC) Stump stable.     Rodney Resides, MD [place] [facility]

## 2021-05-20 ENCOUNTER — Telehealth: Payer: Self-pay | Admitting: Family Medicine

## 2021-05-20 ENCOUNTER — Telehealth: Payer: Self-pay

## 2021-05-20 LAB — BASIC METABOLIC PANEL
BUN/Creatinine Ratio: 37 — ABNORMAL HIGH (ref 10–24)
BUN: 49 mg/dL — ABNORMAL HIGH (ref 8–27)
CO2: 28 mmol/L (ref 20–29)
Calcium: 8.6 mg/dL (ref 8.6–10.2)
Chloride: 102 mmol/L (ref 96–106)
Creatinine, Ser: 1.31 mg/dL — ABNORMAL HIGH (ref 0.76–1.27)
Glucose: 196 mg/dL — ABNORMAL HIGH (ref 70–99)
Potassium: 5.3 mmol/L — ABNORMAL HIGH (ref 3.5–5.2)
Sodium: 141 mmol/L (ref 134–144)
eGFR: 59 mL/min/{1.73_m2} — ABNORMAL LOW (ref >59)

## 2021-05-20 LAB — CBC
Hematocrit: 28.2 % — ABNORMAL LOW (ref 37.5–51.0)
Hemoglobin: 8.4 g/dL — ABNORMAL LOW (ref 13.0–17.7)
MCH: 24.9 pg — ABNORMAL LOW (ref 26.6–33.0)
MCHC: 29.8 g/dL — ABNORMAL LOW (ref 31.5–35.7)
MCV: 84 fL (ref 79–97)
Platelets: 255 10*3/uL (ref 150–450)
RBC: 3.37 x10E6/uL — ABNORMAL LOW (ref 4.14–5.80)
RDW: 16.2 % — ABNORMAL HIGH (ref 11.6–15.4)
WBC: 11.4 10*3/uL — ABNORMAL HIGH (ref 3.4–10.8)

## 2021-05-20 NOTE — Telephone Encounter (Signed)
Spoke with pt's wife regarding options with medication assistance for pt's Trelegy.  Gave her the phone number to social security so pt can apply for Low Income Subsidy/ Extra Help. Informed her we needed to take this step first to see if pt can get lowered copays at his pharmacy. Pt or wife will return phone call to me with outcome. We will move forward with any assistance from there.

## 2021-05-20 NOTE — Telephone Encounter (Signed)
Called with labs.  No change in lasix dose for now.  Confirmed that he is taking iron.  I will see him in 10-14 days to reexam and check labs.  He will call to make an appointment.

## 2021-05-20 NOTE — Progress Notes (Signed)
Patient discharged from hospital over the weekend. Late entry.   Medication Samples have been provided to the patient.  Drug name: Trelegy Ellipta        Strength: 156mcg fluticasone        Qty: 28 day supply (2 x 14 day inhalers)  LOT: [id number]  Exp.Date: 11/25/2022  Dosing instructions: one inhalation daily  The patient has been instructed regarding the correct time, dose, and frequency of taking this medication, including desired effects and most common side effects by Pharmacy Resident Zenaida Deed, PharmD.  Rodney [name] 9:14 AM 05/20/2021

## 2021-05-21 ENCOUNTER — Encounter: Payer: Self-pay | Admitting: Family Medicine

## 2021-05-21 NOTE — Assessment & Plan Note (Signed)
Continue O2 and inhaler.  Ordered nebulizer and duonebs.

## 2021-05-21 NOTE — Assessment & Plan Note (Signed)
Stable on iron.  Continue no coumadin.  Continue plavix.

## 2021-05-21 NOTE — Assessment & Plan Note (Signed)
Followed by [facility].  I explained that he has both macrovascular and microvascular disease.  Procedures only help the macrovascular disease.

## 2021-05-21 NOTE — Chronic Care Management (AMB) (Signed)
°  Care Management   Outreach Note  05/21/2021 Name: Rodney [name] MRN: [id number] DOB: Sep 08, 1950  Referred by: [name], MD Reason for referral : Care Coordination (Initial outreach to schedule referral with [facility])   A second unsuccessful telephone outreach was attempted today. The patient was referred to the case management team for assistance with care management and care coordination.   Follow Up Plan:  A HIPAA compliant phone message was left for the patient providing contact information and requesting a return call.  The care management team will reach out to the patient again over the next 7 days.  If patient returns call to provider office, please advise to call [place]* at [place] [facility]  Direct Dial: [phone]

## 2021-05-21 NOTE — Assessment & Plan Note (Signed)
AS now severe which adds to his chronic disease burden.

## 2021-05-21 NOTE — Assessment & Plan Note (Signed)
Stump stable.

## 2021-05-21 NOTE — Assessment & Plan Note (Signed)
Unclear dry weight.  Avoid over diuresis to avoid AKI.  Continue lasix at current dose.

## 2021-05-26 ENCOUNTER — Ambulatory Visit: Payer: Medicare PPO | Admitting: Family Medicine

## 2021-05-26 NOTE — Chronic Care Management (AMB) (Signed)
°  Care Management   Note  05/26/2021 Name: Rodney [name] MRN: [id number] DOB: 1950/08/26  Rodney [name] is a 71 y.o. year old male who is a primary care patient of Hensel, Jamal Collin, MD. I reached out to Rodney [name] by phone today in response to a referral sent by Rodney [name] primary care provider.   [name] was given information about care management services today including:  Care management services include personalized support from designated clinical staff supervised by his physician, including individualized plan of care and coordination with other care providers 24/7 contact phone numbers for assistance for urgent and routine care needs. The patient may stop care management services at any time by phone call to the office staff.  Patient agreed to services and verbal consent obtained.   Follow up plan: Telephone appointment with care management team member scheduled for:06/02/21 Per patient okay to speak to [place] significant other care giver.   [place] Management  Direct Dial: [phone]

## 2021-05-28 ENCOUNTER — Other Ambulatory Visit: Payer: Self-pay

## 2021-05-28 ENCOUNTER — Encounter (HOSPITAL_COMMUNITY): Payer: Self-pay | Admitting: Emergency Medicine

## 2021-05-28 ENCOUNTER — Emergency Department (HOSPITAL_COMMUNITY): Payer: Medicare PPO

## 2021-05-28 ENCOUNTER — Inpatient Hospital Stay (HOSPITAL_COMMUNITY)
Admission: EM | Admit: 2021-05-28 | Discharge: 2021-06-01 | DRG: 177 | Disposition: A | Discharge status: Home / Self Care | Payer: Medicare PPO | Attending: Family Medicine | Admitting: Family Medicine

## 2021-05-28 DIAGNOSIS — E1151 Type 2 diabetes mellitus with diabetic peripheral angiopathy without gangrene: Secondary | ICD-10-CM | POA: Diagnosis present

## 2021-05-28 DIAGNOSIS — E1142 Type 2 diabetes mellitus with diabetic polyneuropathy: Secondary | ICD-10-CM | POA: Diagnosis present

## 2021-05-28 DIAGNOSIS — J440 Chronic obstructive pulmonary disease with acute lower respiratory infection: Secondary | ICD-10-CM | POA: Diagnosis present

## 2021-05-28 DIAGNOSIS — R069 Unspecified abnormalities of breathing: Secondary | ICD-10-CM | POA: Diagnosis not present

## 2021-05-28 DIAGNOSIS — I130 Hypertensive heart and chronic kidney disease with heart failure and stage 1 through stage 4 chronic kidney disease, or unspecified chronic kidney disease: Secondary | ICD-10-CM | POA: Diagnosis present

## 2021-05-28 DIAGNOSIS — F1290 Cannabis use, unspecified, uncomplicated: Secondary | ICD-10-CM | POA: Diagnosis present

## 2021-05-28 DIAGNOSIS — E7800 Pure hypercholesterolemia, unspecified: Secondary | ICD-10-CM | POA: Diagnosis present

## 2021-05-28 DIAGNOSIS — I2510 Atherosclerotic heart disease of native coronary artery without angina pectoris: Secondary | ICD-10-CM | POA: Diagnosis present

## 2021-05-28 DIAGNOSIS — Z89411 Acquired absence of right great toe: Secondary | ICD-10-CM

## 2021-05-28 DIAGNOSIS — J189 Pneumonia, unspecified organism: Secondary | ICD-10-CM | POA: Diagnosis present

## 2021-05-28 DIAGNOSIS — E1149 Type 2 diabetes mellitus with other diabetic neurological complication: Secondary | ICD-10-CM | POA: Diagnosis present

## 2021-05-28 DIAGNOSIS — J9621 Acute and chronic respiratory failure with hypoxia: Secondary | ICD-10-CM | POA: Diagnosis present

## 2021-05-28 DIAGNOSIS — F1490 Cocaine use, unspecified, uncomplicated: Secondary | ICD-10-CM | POA: Diagnosis present

## 2021-05-28 DIAGNOSIS — U071 COVID-19: Principal | ICD-10-CM

## 2021-05-28 DIAGNOSIS — Z794 Long term (current) use of insulin: Secondary | ICD-10-CM

## 2021-05-28 DIAGNOSIS — I509 Heart failure, unspecified: Secondary | ICD-10-CM | POA: Diagnosis not present

## 2021-05-28 DIAGNOSIS — E1122 Type 2 diabetes mellitus with diabetic chronic kidney disease: Secondary | ICD-10-CM | POA: Diagnosis present

## 2021-05-28 DIAGNOSIS — R0602 Shortness of breath: Secondary | ICD-10-CM | POA: Diagnosis not present

## 2021-05-28 DIAGNOSIS — R0689 Other abnormalities of breathing: Secondary | ICD-10-CM | POA: Diagnosis not present

## 2021-05-28 DIAGNOSIS — L03115 Cellulitis of right lower limb: Secondary | ICD-10-CM | POA: Diagnosis present

## 2021-05-28 DIAGNOSIS — I110 Hypertensive heart disease with heart failure: Secondary | ICD-10-CM | POA: Diagnosis not present

## 2021-05-28 DIAGNOSIS — Z7901 Long term (current) use of anticoagulants: Secondary | ICD-10-CM

## 2021-05-28 DIAGNOSIS — I350 Nonrheumatic aortic (valve) stenosis: Secondary | ICD-10-CM | POA: Diagnosis present

## 2021-05-28 DIAGNOSIS — Z6832 Body mass index (BMI) 32.0-32.9, adult: Secondary | ICD-10-CM

## 2021-05-28 DIAGNOSIS — E875 Hyperkalemia: Secondary | ICD-10-CM | POA: Diagnosis present

## 2021-05-28 DIAGNOSIS — E46 Unspecified protein-calorie malnutrition: Secondary | ICD-10-CM | POA: Diagnosis present

## 2021-05-28 DIAGNOSIS — Z79899 Other long term (current) drug therapy: Secondary | ICD-10-CM

## 2021-05-28 DIAGNOSIS — D649 Anemia, unspecified: Secondary | ICD-10-CM | POA: Diagnosis not present

## 2021-05-28 DIAGNOSIS — Z7984 Long term (current) use of oral hypoglycemic drugs: Secondary | ICD-10-CM

## 2021-05-28 DIAGNOSIS — R0603 Acute respiratory distress: Secondary | ICD-10-CM | POA: Diagnosis not present

## 2021-05-28 DIAGNOSIS — J449 Chronic obstructive pulmonary disease, unspecified: Secondary | ICD-10-CM

## 2021-05-28 DIAGNOSIS — Z89512 Acquired absence of left leg below knee: Secondary | ICD-10-CM

## 2021-05-28 DIAGNOSIS — N1831 Chronic kidney disease, stage 3a: Secondary | ICD-10-CM | POA: Diagnosis present

## 2021-05-28 DIAGNOSIS — I491 Atrial premature depolarization: Secondary | ICD-10-CM | POA: Diagnosis not present

## 2021-05-28 DIAGNOSIS — D638 Anemia in other chronic diseases classified elsewhere: Secondary | ICD-10-CM | POA: Diagnosis present

## 2021-05-28 DIAGNOSIS — I480 Paroxysmal atrial fibrillation: Secondary | ICD-10-CM | POA: Diagnosis present

## 2021-05-28 DIAGNOSIS — Z7902 Long term (current) use of antithrombotics/antiplatelets: Secondary | ICD-10-CM

## 2021-05-28 DIAGNOSIS — I482 Chronic atrial fibrillation, unspecified: Secondary | ICD-10-CM | POA: Diagnosis present

## 2021-05-28 DIAGNOSIS — I5033 Acute on chronic diastolic (congestive) heart failure: Secondary | ICD-10-CM | POA: Diagnosis present

## 2021-05-28 DIAGNOSIS — E669 Obesity, unspecified: Secondary | ICD-10-CM | POA: Diagnosis present

## 2021-05-28 DIAGNOSIS — J90 Pleural effusion, not elsewhere classified: Secondary | ICD-10-CM | POA: Diagnosis not present

## 2021-05-28 DIAGNOSIS — G4700 Insomnia, unspecified: Secondary | ICD-10-CM | POA: Diagnosis present

## 2021-05-28 DIAGNOSIS — R0902 Hypoxemia: Secondary | ICD-10-CM | POA: Diagnosis not present

## 2021-05-28 DIAGNOSIS — R000 Tachycardia, unspecified: Secondary | ICD-10-CM | POA: Diagnosis not present

## 2021-05-28 DIAGNOSIS — I517 Cardiomegaly: Secondary | ICD-10-CM | POA: Diagnosis not present

## 2021-05-28 DIAGNOSIS — J1282 Pneumonia due to coronavirus disease 2019: Secondary | ICD-10-CM | POA: Diagnosis present

## 2021-05-28 DIAGNOSIS — Z87891 Personal history of nicotine dependence: Secondary | ICD-10-CM

## 2021-05-28 DIAGNOSIS — R059 Cough, unspecified: Secondary | ICD-10-CM | POA: Diagnosis not present

## 2021-05-28 LAB — TROPONIN I (HIGH SENSITIVITY)
Troponin I (High Sensitivity): 32 ng/L — ABNORMAL HIGH (ref <18)
Troponin I (High Sensitivity): 37 ng/L — ABNORMAL HIGH (ref <18)

## 2021-05-28 LAB — CBC WITH DIFFERENTIAL/PLATELET
Abs Immature Granulocytes: 0.04 10*3/uL (ref 0.00–0.07)
Basophils Absolute: 0 10*3/uL (ref 0.0–0.1)
Basophils Relative: 0 %
Eosinophils Absolute: 0.1 10*3/uL (ref 0.0–0.5)
Eosinophils Relative: 1 %
HCT: 29.5 % — ABNORMAL LOW (ref 39.0–52.0)
Hemoglobin: 8.7 g/dL — ABNORMAL LOW (ref 13.0–17.0)
Immature Granulocytes: 1 %
Lymphocytes Relative: 13 %
Lymphs Abs: 0.9 10*3/uL (ref 0.7–4.0)
MCH: 26.4 pg (ref 26.0–34.0)
MCHC: 29.5 g/dL — ABNORMAL LOW (ref 30.0–36.0)
MCV: 89.7 fL (ref 80.0–100.0)
Monocytes Absolute: 0.4 10*3/uL (ref 0.1–1.0)
Monocytes Relative: 5 %
Neutro Abs: 5.4 10*3/uL (ref 1.7–7.7)
Neutrophils Relative %: 80 %
Platelets: 229 10*3/uL (ref 150–400)
RBC: 3.29 MIL/uL — ABNORMAL LOW (ref 4.22–5.81)
RDW: 19.3 % — ABNORMAL HIGH (ref 11.5–15.5)
WBC: 6.8 10*3/uL (ref 4.0–10.5)
nRBC: 0 % (ref 0.0–0.2)

## 2021-05-28 LAB — COMPREHENSIVE METABOLIC PANEL
ALT: 27 U/L (ref 0–44)
AST: 23 U/L (ref 15–41)
Albumin: 2.6 g/dL — ABNORMAL LOW (ref 3.5–5.0)
Alkaline Phosphatase: 98 U/L (ref 38–126)
Anion gap: 8 (ref 5–15)
BUN: 30 mg/dL — ABNORMAL HIGH (ref 8–23)
CO2: 28 mmol/L (ref 22–32)
Calcium: 8.5 mg/dL — ABNORMAL LOW (ref 8.9–10.3)
Chloride: 102 mmol/L (ref 98–111)
Creatinine, Ser: 1.43 mg/dL — ABNORMAL HIGH (ref 0.61–1.24)
GFR, Estimated: 53 mL/min — ABNORMAL LOW (ref >60)
Glucose, Bld: 169 mg/dL — ABNORMAL HIGH (ref 70–99)
Potassium: 5.3 mmol/L — ABNORMAL HIGH (ref 3.5–5.1)
Sodium: 138 mmol/L (ref 135–145)
Total Bilirubin: 0.8 mg/dL (ref 0.3–1.2)
Total Protein: 6 g/dL — ABNORMAL LOW (ref 6.5–8.1)

## 2021-05-28 LAB — RESP PANEL BY RT-PCR (FLU A&B, COVID) ARPGX2
Influenza A by PCR: NEGATIVE
Influenza B by PCR: NEGATIVE
SARS Coronavirus 2 by RT PCR: POSITIVE — AB

## 2021-05-28 LAB — LACTIC ACID, PLASMA
Lactic Acid, Venous: 1 mmol/L (ref 0.5–1.9)
Lactic Acid, Venous: 1 mmol/L (ref 0.5–1.9)

## 2021-05-28 LAB — BRAIN NATRIURETIC PEPTIDE: B Natriuretic Peptide: 594.1 pg/mL — ABNORMAL HIGH (ref 0.0–100.0)

## 2021-05-28 IMAGING — DX DG CHEST 1V PORT
1 series · 1 of 1 positions shown · non-contrast
Comparison: [DATE]

CLINICAL DATA: Cough, short of breath

EXAM:
PORTABLE CHEST 1 VIEW

[chest ap]
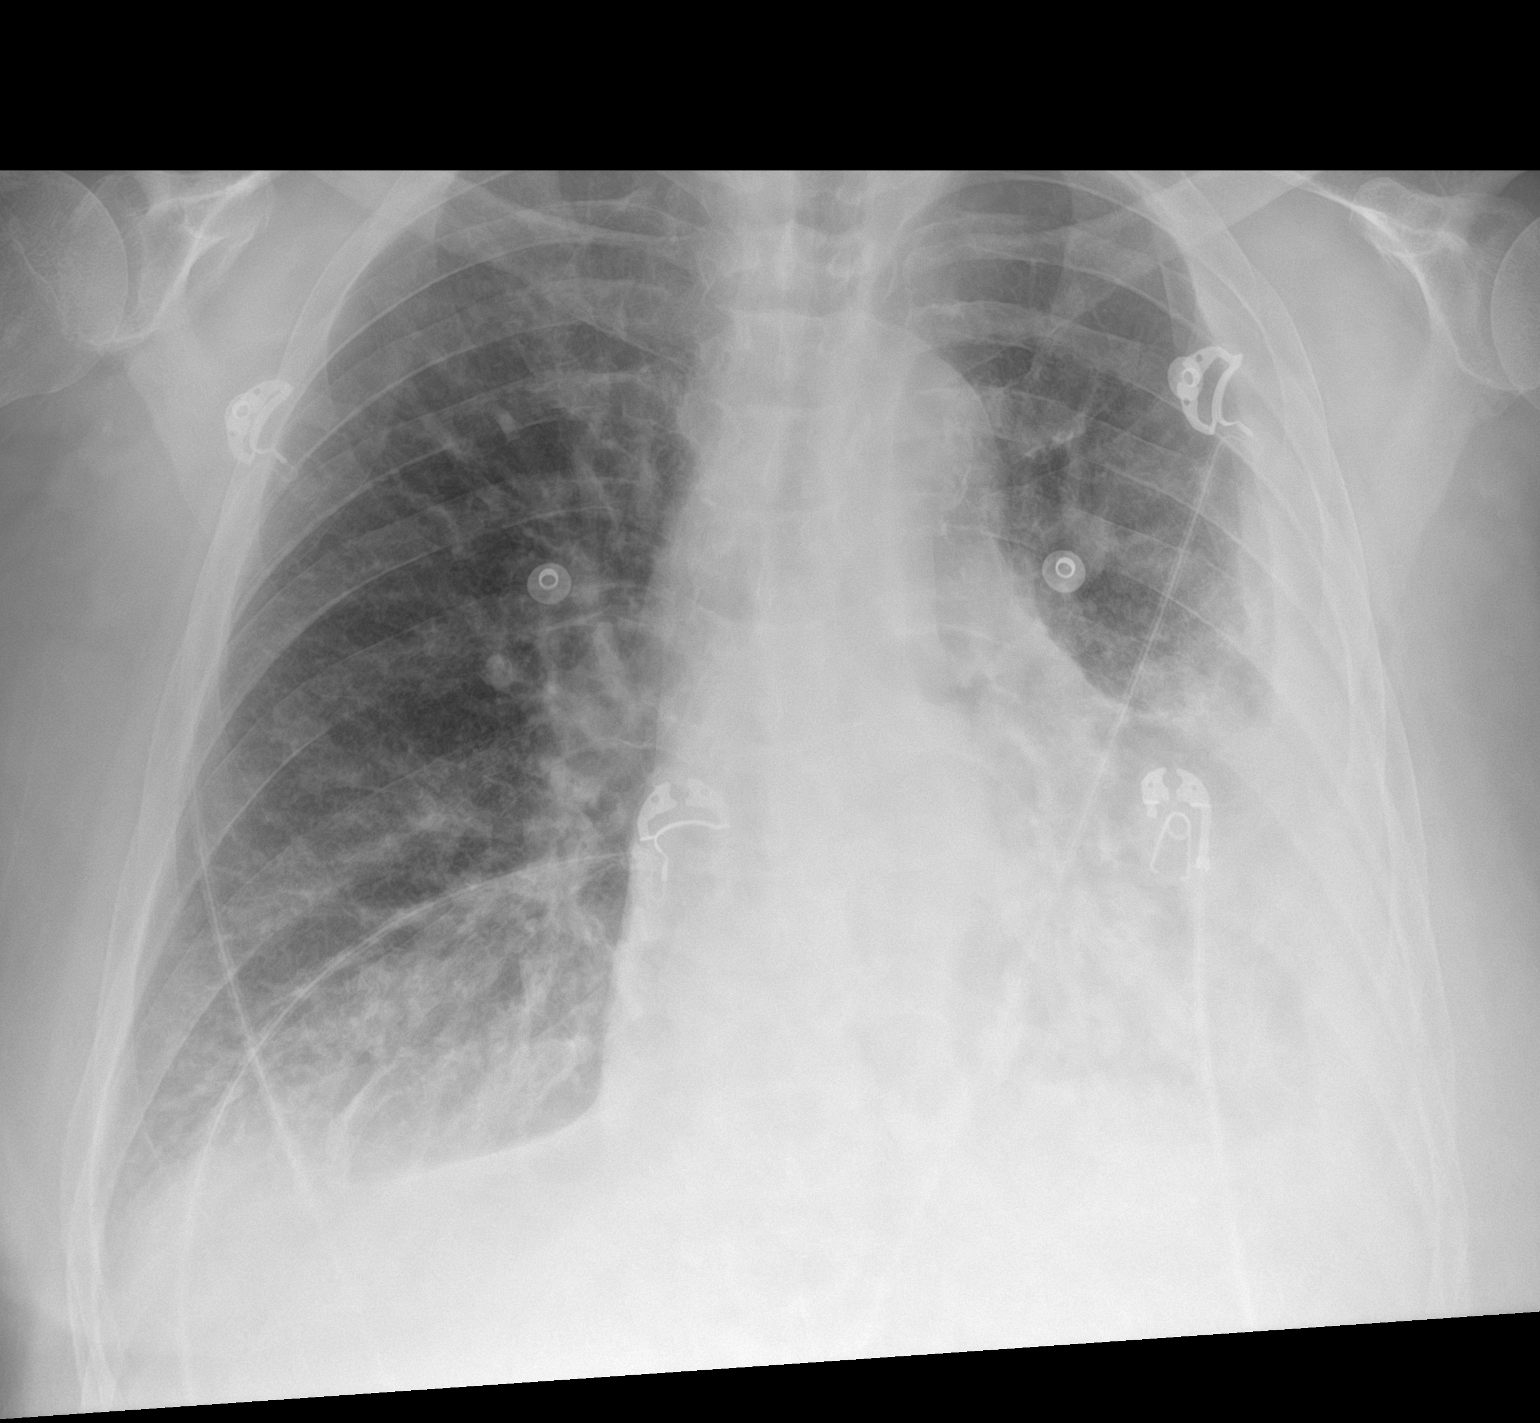

[1 of 1 positions shown; findings below may reference images not displayed]

FINDINGS: Single frontal view of the chest demonstrates stable enlargement of
the cardiac silhouette. There is persistent central vascular
congestion, with interval increase in bibasilar consolidation as
well as enlarging bilateral pleural effusions. The left pleural
effusion is at least partially loculated. No pneumothorax. No acute
bony abnormalities.
IMPRESSION: 1. Progressive bibasilar consolidation and effusions, left greater
than right, with persistent central vascular congestion. While the
findings likely reflects worsening congestive heart failure,
underlying infection cannot be excluded.

## 2021-05-28 MED ORDER — VANCOMYCIN HCL 2000 MG/400ML IV SOLN
2000.0000 mg | Freq: Once | INTRAVENOUS | Status: AC
  Administered 2021-05-29: 2000 mg via INTRAVENOUS
  Filled 2021-05-28: qty 400

## 2021-05-28 MED ORDER — SODIUM CHLORIDE 0.9 % IV SOLN
1.0000 g | Freq: Once | INTRAVENOUS | Status: DC
  Administered 2021-05-28: 1 g via INTRAVENOUS
  Filled 2021-05-28: qty 10

## 2021-05-28 MED ORDER — ALBUTEROL SULFATE (2.5 MG/3ML) 0.083% IN NEBU
20.0000 mg | INHALATION_SOLUTION | RESPIRATORY_TRACT | Status: DC
  Administered 2021-05-28: 20 mg via RESPIRATORY_TRACT
  Filled 2021-05-28: qty 24

## 2021-05-28 MED ORDER — METRONIDAZOLE 500 MG/100ML IV SOLN
500.0000 mg | Freq: Two times a day (BID) | INTRAVENOUS | Status: DC
  Administered 2021-05-28: 500 mg via INTRAVENOUS
  Filled 2021-05-28: qty 100

## 2021-05-28 MED ORDER — VANCOMYCIN HCL 1250 MG/250ML IV SOLN: 1250.0000 mg | INTRAVENOUS | Status: DC

## 2021-05-28 MED ORDER — ALBUTEROL SULFATE (2.5 MG/3ML) 0.083% IN NEBU
INHALATION_SOLUTION | RESPIRATORY_TRACT | Status: AC
  Filled 2021-05-28: qty 18

## 2021-05-28 MED ORDER — ALBUTEROL SULFATE (2.5 MG/3ML) 0.083% IN NEBU
INHALATION_SOLUTION | RESPIRATORY_TRACT | Status: AC
  Filled 2021-05-28: qty 6

## 2021-05-28 MED ORDER — FUROSEMIDE 10 MG/ML IJ SOLN
40.0000 mg | Freq: Once | INTRAMUSCULAR | Status: AC
  Administered 2021-05-28: 40 mg via INTRAVENOUS
  Filled 2021-05-28: qty 4

## 2021-05-28 MED ORDER — SODIUM CHLORIDE 0.9 % IV SOLN
2.0000 g | Freq: Three times a day (TID) | INTRAVENOUS | Status: DC
  Administered 2021-05-28 – 2021-05-29 (×2): 2 g via INTRAVENOUS
  Filled 2021-05-28 (×2): qty 2

## 2021-05-28 MED ORDER — MAGNESIUM SULFATE 2 GM/50ML IV SOLN
2.0000 g | Freq: Once | INTRAVENOUS | Status: AC
  Administered 2021-05-28: 2 g via INTRAVENOUS
  Filled 2021-05-28: qty 50

## 2021-05-28 MED ORDER — METHYLPREDNISOLONE SODIUM SUCC 125 MG IJ SOLR
125.0000 mg | Freq: Once | INTRAMUSCULAR | Status: AC
Start: 2021-05-28 — End: 2021-05-28
  Administered 2021-05-28: 125 mg via INTRAVENOUS
  Filled 2021-05-28: qty 2

## 2021-05-28 MED ORDER — SODIUM CHLORIDE 0.9 % IV SOLN
500.0000 mg | INTRAVENOUS | Status: DC
  Filled 2021-05-28: qty 5

## 2021-05-28 MED ORDER — ALBUTEROL SULFATE (2.5 MG/3ML) 0.083% IN NEBU
15.0000 mg | INHALATION_SOLUTION | RESPIRATORY_TRACT | Status: DC
Start: 2021-05-28 — End: 2021-05-28

## 2021-05-28 NOTE — ED Notes (Signed)
Labs drawn by Earlie Raveling, RN

## 2021-05-28 NOTE — Progress Notes (Signed)
Pharmacy Antibiotic Note  Rodney [name] is a 71 y.o. male admitted on 05/28/2021 with cellulitis.  Pharmacy has been consulted for cefepime and vancomycin dosing.  WBC wnl. SCr mildly elevated   Plan: -Vancomycin 2 gm IV loading dose, then start Vancomycin 1250 mg IV Q 24 hrs. Goal AUC 400-550. Expected AUC: 483 SCr used: 1.43 -Cefepime 2 gm IV Q 8 hours -Monitor CBC, renal fx, cultures and clinical progress -Vanc levels as indicated       Temp (24hrs), Avg:98.3 F (36.8 C), Min:98.3 F (36.8 C), Max:98.3 F (36.8 C)  Recent Labs  Lab 05/28/21 1841 05/28/21 1846 05/28/21 2041  WBC  --  6.8  --   CREATININE  --  1.43*  --   LATICACIDVEN 1.0  --  1.0    Estimated Creatinine Clearance: 61.4 mL/min (A) (by C-G formula based on SCr of 1.43 mg/dL (H)).    No Known Allergies  Antimicrobials this admission: Cefepime 2/1 >>  Vancomycin 2/1 >>   Dose adjustments this admission:   Microbiology results: 2/1 BCx:    Thank you for allowing pharmacy to be a part of this patients care.  Albertina Parr, PharmD., BCCCP Clinical Pharmacist Please refer to [facility] for unit-specific pharmacist

## 2021-05-28 NOTE — ED Notes (Signed)
[facility] callled for transport

## 2021-05-28 NOTE — ED Provider Notes (Signed)
[facility] EMERGENCY DEPARTMENT Provider Note   CSN: [id number] Arrival date & time: 05/28/21  1831     History  Chief Complaint  Patient presents with   Respiratory Distress    Rodney [name] is a 71 y.o. male.  HPI     71yo male with history of atrial fibrillation on longer on coumadin due to decreasing hemoglobin during recent admission, PVD with nonhealing wounds of right foot and recent femoral artery/aortogram during recent admission, COPD, CHF, CAD, Dm, htn, hlpd, recent admission for respiratory failure with hypoxia who presents with shortness of breath.   Reports cough has continued since recent admission and has worsned, becoming more productive, productive of white-yellow sputum. No known fevers. Has had fatigeu.  Does not note any changes in his wound of the RLE.  One hour prior to arrival felt worsening shortness of breath. Feels wheezing, does not lay flat to sleep. No asymmetric leg pain or swelling. No congestion or sore throat. Has had black stool for about one week.  No nausea, vomiting.   ON EMS arrival was in respiratory distress, they place don duonebs and CPAP with significant improvement.   Past Medical History:  Diagnosis Date   Aortic stenosis    moderate in 2022   Atrial fibrillation (HCC)    CHF (congestive heart failure) ([place])    Coronary artery disease    Diabetes mellitus without complication (HCC)    HLD (hyperlipidemia)    Hypertension    Peripheral arterial disease ([place])    Past Surgical History:  Procedure Laterality Date   ABDOMINAL AORTOGRAM W/LOWER EXTREMITY N/A 08/05/2020   Procedure: ABDOMINAL AORTOGRAM W/LOWER EXTREMITY;  Surgeon: Marty Heck, MD;  Location: [place] [facility];  Service: Cardiovascular;  Laterality: N/A;   ABDOMINAL AORTOGRAM W/LOWER EXTREMITY N/A 11/13/2020   Procedure: ABDOMINAL AORTOGRAM W/LOWER EXTREMITY;  Surgeon: Cherre Robins, MD;  Location: [place] [facility];  Service:  Cardiovascular;  Laterality: N/A;   ABDOMINAL AORTOGRAM W/LOWER EXTREMITY N/A 05/12/2021   Procedure: ABDOMINAL AORTOGRAM W/LOWER EXTREMITY;  Surgeon: Waynetta Sandy, MD;  Location: [place] [facility];  Service: Cardiovascular;  Laterality: N/A;   AMPUTATION Left 09/28/2019   Procedure: AMPUTATION BELOW KNEE;  Surgeon: Newt Minion, MD;  Location: [place];  Service: Orthopedics;  Laterality: Left;   AMPUTATION Right 11/15/2020   Procedure: RIGHT GREAT TOE AMPUTATION;  Surgeon: Newt Minion, MD;  Location: [place];  Service: Orthopedics;  Laterality: Right;   CARDIAC CATHETERIZATION     CARDIOVERSION N/A 10/05/2019   Procedure: CARDIOVERSION;  Surgeon: Sanda Klein, MD;  Location: [place] [facility];  Service: Cardiovascular;  Laterality: N/A;   FEMORAL-POPLITEAL BYPASS GRAFT Right 08/07/2020   Procedure: RIGHT FEMORAL TO BELOW KNEE POPLITEAL ARTERY BYPASS;  Surgeon: Waynetta Sandy, MD;  Location: [place];  Service: Vascular;  Laterality: Right;   LEFT HEART CATH AND CORONARY ANGIOGRAPHY N/A 10/03/2019   Procedure: LEFT HEART CATH AND CORONARY ANGIOGRAPHY;  Surgeon: Lorretta Harp, MD;  Location: [place] [facility];  Service: Cardiovascular;  Laterality: N/A;   PERIPHERAL VASCULAR INTERVENTION Right 08/05/2020   Procedure: PERIPHERAL VASCULAR INTERVENTION;  Surgeon: Marty Heck, MD;  Location: [place] [facility];  Service: Cardiovascular;  Laterality: Right;  common Iliac   PERIPHERAL VASCULAR INTERVENTION Left 11/13/2020   Procedure: PERIPHERAL VASCULAR INTERVENTION;  Surgeon: Cherre Robins, MD;  Location: [place] [facility];  Service: Cardiovascular;  Laterality: Left;   PERIPHERAL VASCULAR INTERVENTION Right 11/14/2020  Procedure: PERIPHERAL VASCULAR INTERVENTION;  Surgeon: Marty Heck, MD;  Location: [place] [facility];  Service: Cardiovascular;  Laterality: Right;  POP/SFA STENT   PERIPHERAL VASCULAR INTERVENTION  05/12/2021   Procedure: PERIPHERAL VASCULAR  INTERVENTION;  Surgeon: Waynetta Sandy, MD;  Location: [place] [facility];  Service: Cardiovascular;;   TEE WITHOUT CARDIOVERSION N/A 10/05/2019   Procedure: TRANSESOPHAGEAL ECHOCARDIOGRAM (TEE);  Surgeon: Sanda Klein, MD;  Location: [facility];  Service: Cardiovascular;  Laterality: N/A;     Home Medications Prior to Admission medications   Medication Sig Start Date End Date Taking? Authorizing Provider  acetaminophen (TYLENOL) 650 MG CR tablet Take 1,300 mg by mouth in the morning and at bedtime.   Yes [provider]  albuterol (VENTOLIN HFA) 108 (90 Base) MCG/ACT inhaler INHALE 2 PUFFS INTO THE LUNGS EVERY 6 (SIX) HOURS AS NEEDED FOR WHEEZING OR SHORTNESS OF BREATH. 04/24/21  Yes Hensel, Jamal Collin, MD  amiodarone (PACERONE) 200 MG tablet Take 1 tablet (200 mg total) by mouth daily. 10/14/20  Yes Hensel, Jamal Collin, MD  atorvastatin (LIPITOR) 80 MG tablet TAKE 1 TABLET EVERY DAY Patient taking differently: Take 80 mg by mouth daily. 03/11/21  Yes O'Neal, Cassie Freer, MD  carvedilol (COREG) 12.5 MG tablet Take 6.25 mg by mouth 2 (two) times daily with a meal. 12/18/20  Yes [provider]  clopidogrel (PLAVIX) 75 MG tablet TAKE 1 TABLET EVERY DAY WITH BREAKFAST Patient taking differently: Take 75 mg by mouth daily. 03/17/21  Yes Hensel, Jamal Collin, MD  ferrous sulfate 325 (65 FE) MG tablet Take 1 tablet (325 mg total) by mouth every other day. Patient taking differently: Take 325 mg by mouth daily with breakfast. 05/18/21  Yes Paige, Victoria J, DO  fluticasone (FLONASE) 50 MCG/ACT nasal spray Place 2 sprays into both nostrils daily. 02/13/21  Yes Simmons-Robinson, Makiera, MD  furosemide (LASIX) 40 MG tablet Take 1 tablet (40 mg total) by mouth daily as needed for fluid (swelling). Patient taking differently: Take 40 mg by mouth daily. 02/13/21  Yes Simmons-Robinson, Makiera, MD  gabapentin (NEURONTIN) 300 MG capsule Take 600 mg by mouth at bedtime.   Yes  [provider]  insulin detemir (LEVEMIR) 100 UNIT/ML FlexPen Inject 25 Units into the skin daily. 05/16/21  Yes Paige, Victoria J, DO  lactose free nutrition (BOOST) LIQD Take 237 mLs by mouth 3 (three) times a week.   Yes [provider]  metFORMIN (GLUCOPHAGE) 1000 MG tablet Take 1 tablet (1,000 mg total) by mouth daily. 04/07/21  Yes Lavina Hamman, MD  pantoprazole (PROTONIX) 40 MG tablet Take 1 tablet (40 mg total) by mouth 2 (two) times daily. 05/16/21 06/15/21 Yes Paige, Weldon Picking, DO  traZODone (DESYREL) 50 MG tablet Take 0.5-1 tablets (25-50 mg total) by mouth at bedtime as needed for sleep. 05/19/21  Yes Hensel, Jamal Collin, MD  Blood Glucose Monitoring Suppl (TRUE METRIX METER) DEVI Use to test blood sugar three times daily. Patient not taking: Reported on 05/07/2021 11/20/19   [name], MD  Blood Glucose Monitoring Suppl (TRUE METRIX METER) w/Device KIT USE AS DIRECTED Patient not taking: Reported on 05/07/2021 03/25/20   [name], MD  glucose blood (RELION TRUE METRIX TEST STRIPS) test strip Use to test blood sugar three times per day. Patient not taking: Reported on 05/07/2021 11/20/19   [name], MD  Insulin Pen Needle 32G X 4 MM MISC Use to inject insulin up to 4 times daily as directed,  05/16/21     ipratropium-albuterol (DUONEB) 0.5-2.5 (3) MG/3ML SOLN Take 3 mLs by nebulization every 4 (four) hours as needed. 05/19/21   [name], MD  Nutritional Supplements (FEEDING SUPPLEMENT, NEPRO CARB STEADY,) LIQD Take 237 mLs by mouth 2 (two) times daily between meals. Patient not taking: Reported on 05/07/2021 11/22/20   Ezequiel Essex, MD  polyethylene glycol (MIRALAX / GLYCOLAX) 17 g packet Take 17 g by mouth daily as needed for moderate constipation. Patient not taking: Reported on 05/01/2021 03/28/21   Lavina Hamman, MD  TRUEplus Lancets 33G MISC Use to test blood sugar three times per day. Patient not taking: Reported on 05/07/2021 11/20/19    [name], MD      Allergies    Patient has no known allergies.    Review of Systems   Review of Systems See above  Physical Exam Updated Vital Signs BP 132/85    Pulse 83    Temp 98.3 F (36.8 C) (Oral)    Resp 18    SpO2 95%  Physical Exam Vitals and nursing note reviewed.  Constitutional:      General: He is not in acute distress.    Appearance: He is well-developed. He is not diaphoretic.  HENT:     Head: Normocephalic and atraumatic.  Eyes:     Conjunctiva/sclera: Conjunctivae normal.  Cardiovascular:     Rate and Rhythm: Normal rate and regular rhythm.     Heart sounds: Normal heart sounds. No murmur heard.   No friction rub. No gallop.  Pulmonary:     Effort: Pulmonary effort is normal. Tachypnea present. No respiratory distress.     Breath sounds: Wheezing and rales present.  Abdominal:     General: There is no distension.     Palpations: Abdomen is soft.     Tenderness: There is no abdominal tenderness. There is no guarding.  Musculoskeletal:     Cervical back: Normal range of motion.  Skin:    General: Skin is warm and dry.     Comments: Necrosis to tip of toe, erythema of foot and toe, RLE swelling   Neurological:     Mental Status: He is alert and oriented to person, place, and time.    ED Results / Procedures / Treatments   Labs (all labs ordered are listed, but only abnormal results are displayed) Labs Reviewed  RESP PANEL BY RT-PCR (FLU A&B, COVID) ARPGX2 - Abnormal; Notable for the following components:      Result Value   SARS Coronavirus 2 by RT PCR POSITIVE (*)    All other components within normal limits  CBC WITH DIFFERENTIAL/PLATELET - Abnormal; Notable for the following components:   RBC 3.29 (*)    Hemoglobin 8.7 (*)    HCT 29.5 (*)    MCHC 29.5 (*)    RDW 19.3 (*)    All other components within normal limits  COMPREHENSIVE METABOLIC PANEL - Abnormal; Notable for the following components:   Potassium 5.3 (*)    Glucose, Bld  169 (*)    BUN 30 (*)    Creatinine, Ser 1.43 (*)    Calcium 8.5 (*)    Total Protein 6.0 (*)    Albumin 2.6 (*)    GFR, Estimated 53 (*)    All other components within normal limits  BRAIN NATRIURETIC PEPTIDE - Abnormal; Notable for the following components:   B Natriuretic Peptide 594.1 (*)    All other components within normal limits  CBC - Abnormal; Notable for the following components:   RBC 3.18 (*)    Hemoglobin 8.2 (*)    HCT 27.6 (*)    MCH 25.8 (*)    MCHC 29.7 (*)    RDW 19.1 (*)    All other components within normal limits  COMPREHENSIVE METABOLIC PANEL - Abnormal; Notable for the following components:   Glucose, Bld 127 (*)    BUN 29 (*)    Calcium 8.2 (*)    Total Protein 5.8 (*)    Albumin 2.4 (*)    All other components within normal limits  CBG MONITORING, ED - Abnormal; Notable for the following components:   Glucose-Capillary 130 (*)    All other components within normal limits  CBG MONITORING, ED - Abnormal; Notable for the following components:   Glucose-Capillary 159 (*)    All other components within normal limits  TROPONIN I (HIGH SENSITIVITY) - Abnormal; Notable for the following components:   Troponin I (High Sensitivity) 32 (*)    All other components within normal limits  TROPONIN I (HIGH SENSITIVITY) - Abnormal; Notable for the following components:   Troponin I (High Sensitivity) 37 (*)    All other components within normal limits  TROPONIN I (HIGH SENSITIVITY) - Abnormal; Notable for the following components:   Troponin I (High Sensitivity) 31 (*)    All other components within normal limits  CULTURE, BLOOD (ROUTINE X 2)  CULTURE, BLOOD (ROUTINE X 2)  LACTIC ACID, PLASMA  LACTIC ACID, PLASMA    EKG EKG Interpretation  Date/Time:  Wednesday May 28 2021 18:59:01 EST Ventricular Rate:  62 PR Interval:  160 QRS Duration: 96 QT Interval:  442 QTC Calculation: 448 R Axis:   46 Text Interpretation: Sinus rhythm Premature atrial  complexes When compared with ECG of 06-May-2021 19:36, Premature atrial complexes are now present Premature ventricular complexes are no longer present Confirmed by Delora Fuel ([id number]) on 05/29/2021 2:49:18 AM  Radiology DG Chest 2 View  Result Date: 05/29/2021 CLINICAL DATA:  Respiratory distress, shortness of breath 3 months, COVID positive EXAM: CHEST - 2 VIEW COMPARISON:  05/28/2021 FINDINGS: Interval increase in a loculated left pleural effusion, with increased atelectasis or consolidation of the left lung. Mild, diffuse interstitial opacity throughout the aerated right lung. Gross cardiomegaly. IMPRESSION: 1. Interval increase in a loculated left pleural effusion, with increased atelectasis or consolidation of the left lung. 2. Diffuse interstitial opacity throughout the aerated right lung, most consistent with edema. 3.  Cardiomegaly. Electronically Signed   By: Delanna Ahmadi M.D.   On: 05/29/2021 11:13   DG Chest Portable 1 View  Result Date: 05/28/2021 CLINICAL DATA:  Cough, short of breath EXAM: PORTABLE CHEST 1 VIEW COMPARISON:  05/06/2021 FINDINGS: Single frontal view of the chest demonstrates stable enlargement of the cardiac silhouette. There is persistent central vascular congestion, with interval increase in bibasilar consolidation as well as enlarging bilateral pleural effusions. The left pleural effusion is at least partially loculated. No pneumothorax. No acute bony abnormalities. IMPRESSION: 1. Progressive bibasilar consolidation and effusions, left greater than right, with persistent central vascular congestion. While the findings likely reflects worsening congestive heart failure, underlying infection cannot be excluded. Electronically Signed   By: Randa Ngo M.D.   On: 05/28/2021 19:20    Procedures .Critical Care Performed by: Gareth Morgan, MD Authorized by: Gareth Morgan, MD   Critical care provider statement:    Critical care time (minutes):  30   Critical care was  time spent  personally by me on the following activities:  Development of treatment plan with patient or surrogate, evaluation of patient's response to treatment, examination of patient, ordering and review of laboratory studies, ordering and review of radiographic studies, ordering and performing treatments and interventions, pulse oximetry, re-evaluation of patient's condition and review of old charts    Medications Ordered in ED Medications  albuterol (PROVENTIL) (2.5 MG/3ML) 0.083% nebulizer solution (  Not Given 05/28/21 2018)  albuterol (PROVENTIL) (2.5 MG/3ML) 0.083% nebulizer solution (  Canceled Entry 05/28/21 1841)  amiodarone (PACERONE) tablet 200 mg (200 mg Oral Given 05/29/21 1149)  atorvastatin (LIPITOR) tablet 80 mg (80 mg Oral Given 05/29/21 1149)  carvedilol (COREG) tablet 6.25 mg (6.25 mg Oral Given 05/29/21 1149)  furosemide (LASIX) tablet 40 mg (40 mg Oral Given 05/29/21 1153)  traZODone (DESYREL) tablet 25-50 mg (has no administration in time range)  pantoprazole (PROTONIX) EC tablet 40 mg (40 mg Oral Given 05/29/21 1152)  clopidogrel (PLAVIX) tablet 75 mg (75 mg Oral Given 05/29/21 1150)  gabapentin (NEURONTIN) capsule 600 mg (0 mg Oral Hold 05/29/21 0055)  lactose free nutrition (Boost) liquid 237 mL (has no administration in time range)  albuterol (PROVENTIL) (2.5 MG/3ML) 0.083% nebulizer solution 3 mL (has no administration in time range)  fluticasone (FLONASE) 50 MCG/ACT nasal spray 2 spray (2 sprays Each Nare Given 05/29/21 1151)  ipratropium-albuterol (DUONEB) 0.5-2.5 (3) MG/3ML nebulizer solution 3 mL (0 mLs Nebulization Not Given 05/29/21 1214)  enoxaparin (LOVENOX) injection 50 mg (50 mg Subcutaneous Given 05/29/21 1150)  dexamethasone (DECADRON) tablet 6 mg (6 mg Oral Given 05/29/21 0054)  guaiFENesin-dextromethorphan (ROBITUSSIN DM) 100-10 MG/5ML syrup 10 mL (has no administration in time range)  insulin aspart (novoLOG) injection 0-9 Units (0 Units Subcutaneous Not Given 05/29/21 1235)   umeclidinium bromide (INCRUSE ELLIPTA) 62.5 MCG/ACT 1 puff (1 puff Inhalation Not Given 05/29/21 1153)  fluticasone furoate-vilanterol (BREO ELLIPTA) 100-25 MCG/ACT 1 puff (1 puff Inhalation Not Given 05/29/21 1153)  remdesivir 200 mg in sodium chloride 0.9% 250 mL IVPB (0 mg Intravenous Stopped 05/29/21 0241)    Followed by  remdesivir 100 mg in sodium chloride 0.9 % 100 mL IVPB (has no administration in time range)  insulin detemir (LEVEMIR) injection 15 Units (15 Units Subcutaneous Given 05/29/21 1153)  magnesium sulfate IVPB 2 g 50 mL (0 g Intravenous Stopped 05/28/21 2019)  methylPREDNISolone sodium succinate (SOLU-MEDROL) 125 mg/2 mL injection 125 mg (125 mg Intravenous Given 05/28/21 1908)  furosemide (LASIX) injection 40 mg (40 mg Intravenous Given 05/28/21 2209)  vancomycin (VANCOREADY) IVPB 2000 mg/400 mL (0 mg Intravenous Stopped 05/29/21 0241)    ED Course/ Medical Decision Making/ A&P                           Medical Decision Making Amount and/or Complexity of Data Reviewed Labs: ordered. Radiology: ordered.  Risk Prescription drug management. Decision regarding hospitalization.   71yo male with history of atrial fibrillation on longer on coumadin due to decreasing hemoglobin during recent admission, PVD with nonhealing wounds of right foot and recent femoral artery/aortogram during recent admission, COPD, CHF, CAD, Dm, htn, hlpd, recent admission for respiratory failure with hypoxia who presents with shortness of breath.   Differential diagnosis for dyspnea includes ACS, PE, COPD exacerbation, CHF exacerbation, anemia, pneumonia, viral etiology such as COVID 19 infection, metabolic abnormality.  Chest x-ray was personally evaluated and interpreted by me shows worsening opacities, effusion EKG was evaluated by me which showed no  acute changes.  BNP waselevated although less than previous.  Given worsening opacities have lower suspicion for PE at this time.  Denies CP, troponin mildly  elevated and stable likely due to demand, doubt ACS. Hgb is stable does describe 1 week of black stool but hemoglobin have not dropped since hospitalization and do not suspect acute/severe GI bleed at this time. No significant electrolyte abnormalities.   Personally evaluated and interpreted all labs and chest XR  In respiratory distress on EMS arrival and placed on CPAP with significant improvement, on arrival to the ED able to remove CPAP and placed on continuous 50m albuterol, gave solumedrol and Mg with concern for exam findings consistent with COPD exacerbation.  Continues to improve but has worsening opacities, and continued dyspnea, will admit for continued care.  Likely multifactorial dyspnea with possible infection, CHF exacerbation and COPD.   Received solumedrol, Mg. Initially ordered rocephin/azithro for CAP however given recent hospitalization and concern for additional wound infection will change to broad spectrum abx.  After admission call, pt also found to be COVID 19 positive.           Final Clinical Impression(s) / ED Diagnoses Final diagnoses:  PNA (pneumonia)  Acute on chronic respiratory failure with hypoxia (HCC)  COVID-19  Anemia, unspecified type  Congestive heart failure, unspecified HF chronicity, unspecified heart failure type ([facility]    Rx / DC Orders ED Discharge Orders     None         SGareth Morgan MD 05/29/21 1526

## 2021-05-28 NOTE — H&P (Addendum)
[facility] Admission History and Physical Service Pager: [phone]  Patient name: Rodney [name] Medical record number: [id number] Date of birth: February 26, 1951 Age: 71 y.o. Gender: male  Primary Care Provider: [name], MD Consultants: None Code Status: FULL Preferred Emergency Contact: Sharkey Nation, girlfriend, [phone]  Chief Complaint: Shortness of breath  Assessment and Plan: Rodney [name] is a 71 y.o. male presenting with shortness of breath and productive cough. PMH is significant for HFpEF, hypertension, COPD, hyperlipidemia, tobacco use, T2DM, protein calorie malnutrition, PVD, aortic stenosis, PAF, s/p left BKA, CAD, CKD stage III.  Acute Hypoxic Respiratory Distress likely 2/2 to COVID+ PNA Patient being admitted for acute respiratory distress and worsening cough.  Did have recent admission for COPD exacerbation 1/10-1/20. Vitals in the ED showed patient being afebrile, pulses mostly normal, some tachypnea in the low 20s, hypertension and 1 desaturation to 87% but overall saturating well on 3 L nasal cannula (home oxygen level of 2.5).  Labs in the ED showed positive COVID test.  Did have hyperkalemia to 5.3, troponins 32> 37, negative LA, stable hemoglobin at 8.7, normal white blood cell count.  BNP 594.1. Blood cultures were collected.  Chest x-ray showed progressive bibasilar consolidation and effusions, left greater than right, with persistent central vascular congestion. Worsening congestive heart failure, underlying infection cannot be excluded and likely Covid PNA contributing. EMS placed pt on CPAP.  In ED was started on CAT at 20 mg, Solu-Medrol 125 mg, magnesium 2 g, Lasix 40 mg injection x 1 for shortness of breath.  Was started on broad-spectrum antibiotics of ceftriaxone switched to cefepime, metronidazole, vancomycin given potential pneumonia given increased cough and she will and thought of cellulitis of RLE per ED provider.   On examination patient's right lower extremity appears around his baseline edema 2+ and  had mildly decreased breath sounds in the bases but overall no wheezes rales or crackles auscultated.  Says his breathing felt better after albuterol and Lasix but continues to have cough.  Differential includes COVID pneumonia given chest x-ray findings and positive COVID test on admission.  Could also be COPD exacerbation given patient had increased sputum with coughs and says he has a "wet cough" for a few weeks.  Bacterial pneumonia less likely given patient has not had a fever in ED and denies any fevers at home and no leukocytosis but has been having increased cough.  Heart failure exacerbation less likely given patient BNP and LE edema seems to be around his baseline (BNP 500s).  ACS less likely given EKG did not show any acute ST changes and denied any chest pain but did have a sinus arrhythmia on EKG with trops of 32>37 and shortness of breath was sudden this afternoon.  PE possible but less likely given patient heart rate has been mostly in the 60s and around his home oxygen requirement.  Has home medications of albuterol as needed, Lasix 40 mg daily, DuoNebs as needed, and has been using samples of Trelegy Ellipta one puff daily. -Admit to FPTS, attending Dr. Nori Riis, med telemetry -Vitals per floor protocol -Cardiac monitoring -Keep oxygen saturations 88 to 92% -Remdesivir course 200 mg once and 100 mg daily for 4 doses -Decadron 6 mg every 24 hours for 10 days -DuoNeb every 4 hours scheduled -Albuterol every 6 hours as needed -Briel Ellipta 1 puff daily while in hospital -Incruse Ellipta  1 puff daily while in hospital -Airborne and contact precautions -Flutter valve and incentive spirometry while awake -Monitor  fever curve -Follow-up blood cultures -Consider de-escalating antibiotic therapy of cefepime, metronidazole, vancomycin -AM CBC, CMP  Dark Stools Patient says he has a history of dark black  appearing "sticky" loose stools for the past week.  Said his bowel movement this morning was green in appearance.  He denies any Eliquis use after last hospitalization when it was discontinued and has just been using his Plavix daily.  Denies any abdominal pain on examination.  He denies any active bleeding.  Hemoglobin was near baseline at 8.7 on admission. -Monitor -Continue Protonix twice daily -Avoid NSAIDs -Lovenox for DVT prophylaxis (consider holding if worsening hgb) -Consider FOBT -Consider GI consult if worsening  Normocytic Anemia Hemoglobin of 8.7 on admission.  MCV was 89.7. On last admission thought to be possible chronic losses from anticoagulation vs anemia of chronic disease and GI was to follow-up outpatient.  Was at high risk for complications with anesthesia given severe aortic stenosis so did not undergo EGD. Home medication of ferrous sulfate 325 mg daily.   -Monitor for bleeding -Continue Protonix twice daily -Avoid NSAIDs -Monitor on CBC -Restart ferrous sulfate pending blood cx -Consider holding DVT prophylaxis if worsening hemoglobin  Right Foot Necrotic Appearing Wounds   PVD   CAD   s/p L BKA S/p multiple stents, bypasses on RLE. On last admission received SFA stent in the right lower extremity during aortogram.  Has chronic venous wounds and foot appeared to have chronic wounds with some necrotic appearance on toes as seen in media tab.  Pulses are difficult to palpate but patient confirmed Doppler signals good while he was here.  Does not appear to be more edematous than his baseline and there is erythema on his anterior shin but similar in appearance to his last hospitalization.  Can consider cellulitis if patient fevers.  Has had medication of Plavix 75 mg daily and atorvastatin 80 mg daily. -Wound care consult for right foot -Consider vascular consult for necrotic appearance of toes  Sinus Arrythmia on EKG EKG showed sinus rhythm, PACs and no acute ST changes.   Troponin was 32>37. AM troponin down trended to 31.  -AM EKG -cardiac monitoring -monitor K (keep >4) and Mag (>2)  COPD Has home medications have albuterol as needed, DuoNebs as needed, and has been using samples of Trelegy Ellipta one puff daily.  Received 20 mg CAT in ED, mag and IV Solu-Medrol. -Scheduled DuoNebs every 4 -Continue home meds (Incruse and Breo sub Trelegy while in hospital) -Decadron course for covid -O2 sats 88 to 92%  HFpEF   Severe AS Last echo 03/24/2021 showed EF of 50 to 55% and severe aortic stenosis.  Likely not a surgical candidate.  BNP around his baseline at 594.  Did not auscultate crackles on lung examination and leg swelling similar to previously.  Received IV Lasix 40 mg x 1.  Home medications of Lasix 40 mg daily and carvedilol 6.25 mg twice daily. -Continue home carvedilol and home Lasix -Strict I's and O's -Daily weights  Hyperkalemia Potassium was mildly elevated at 5.3 on admission.  No peaked T waves on EKG. -Monitor on CMP  Paroxysmal A. fib: Chronic, stable Was in sinus rhythm while in the room. Heart rates have been 50s to 60s.  Home medications of amiodarone 200 mg daily.  His warfarin was stopped at last admission due to potential GI bleed. -Continue home amiodarone -Cardiac monitoring  T2DM: Chronic, stable Last a1c of 6.9 03/24/2021.  Home medication of 25 units Levemir daily.  Glucose  on admission was 169.  Received Solu-Medrol in the ED and will be on dexamethasone course for COVID, anticipate rising glucose. -10 units Levemir -sSSI -CBG monitoring  CKD stage III: Chronic, stable Creatinine on admission was 1.31.  Baseline around 1.3-1.6 recently. -Monitor on CMP -Avoid nephrotoxic agents  HTN: Chronic, stable Blood pressure ranges have been 114-154/58-93.  Home medication of carvedilol 6.25 mg twice daily. -Continue home carvedilol -Monitor blood pressures  HLD: Chronic, stable Last lipid panel 05/13/2021 showed total  cholesterol of 104, LDL 46 and triglycerides of 43.  Well-controlled on his home medication of atorvastatin 80 mg daily. -Continue medication  Tobacco Use Hx Denies any cigarette use in a few months overall on last admission had said that he smoked 4 to 5 cigarettes daily for over 40 years. -Monitor -Encourage cessation if using  Protein calorie malnutrition Albumin of 2.6 on admission.  Home medication of Boost 3 times weekly. -Monitor on CMP -Continue boost   Goals of care Patient has multiple comorbidities and currently wants to continue full treatment.  Has had multiple admissions recently. -Continue goals of care conversations -Consider palliative consult  FEN/GI: Heart healthy/carb modified Prophylaxis: Lovenox  Disposition: Med telemetry  History of Present Illness:  KEJON FEILD is a 71 y.o. male presenting with shortness of breath. Symptoms started this afternoon around 4 PM.  He sat up on his couch and said he couldn't breathe.  He told his girlfriend to call the EMS due to this acute shortness of breath.  He typically uses 2.5 L O2 at home and says he has not had to increase his oxygen use at home.  He has had a cough with increased sputum production for the past couple weeks. He doesn't have a rescue inhaler but has been using his Trelegy Ellipta that he got samples for at his last admission recently. Since being in the ED his breathing has improved " after the breathing treatments and water pill". He says he does not currently smoke and attested the last time he smoked was a couple months ago. He has been taking medications like usual and is taking his plavix.  His warfarin was discontinued at last admission and he denies any use. He endorses a non-watery diarrhea for the past week. States "it was black."  this week but was green this morning as his girlfriend assists him in using the bathroom at home.  Denies any pain on his body.  Denies alcohol use. Ocassionally smokes  marijuana and has a history of cocaine use a few months ago was the last time he used.  Review Of Systems: Per HPI with the following additions:   Review of Systems  Constitutional:  Negative for fever.  HENT:  Negative for rhinorrhea and sore throat.   Eyes:  Negative for visual disturbance.  Respiratory:  Positive for cough, chest tightness and shortness of breath.   Cardiovascular:  Positive for leg swelling. Negative for chest pain.  Gastrointestinal:  Positive for diarrhea. Negative for abdominal pain, blood in stool, nausea and vomiting.  Genitourinary:  Negative for dysuria.  Skin:  Positive for wound.  Neurological:  Negative for dizziness and headaches.  All other systems reviewed and are negative.   Patient Active Problem List   Diagnosis Date Noted   Malnutrition of moderate degree 05/09/2021   Acute respiratory failure with hypoxia (HCC)    Acute on chronic heart failure with preserved ejection fraction (HFpEF) ([place])    Chronic respiratory failure with hypoxia ([place]) 03/26/2021  Acute renal failure superimposed on stage 3a chronic kidney disease ([place]) 03/26/2021   Demand ischemia ([place]) 03/26/2021   Iron (Fe) deficiency anemia 03/26/2021   Hyperkalemia 12/10/2020   COPD exacerbation ([place]) 12/09/2020   Acute combined systolic and diastolic congestive heart failure ([place])    Peripheral neuropathy 11/11/2020   Open wound of right great toe    Supratherapeutic INR 11/10/2020   History of DVT (deep vein thrombosis) 11/10/2020   Acute on chronic anemia    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   CAD (coronary artery disease) 02/15/2020   CKD stage 3 due to type 2 diabetes mellitus ([place]) 02/15/2020   Long term (current) use of anticoagulants 12/29/2019   S/P BKA (below knee amputation) unilateral, left ([place]) 11/14/2019   High risk social situation 11/14/2019   AF (paroxysmal atrial fibrillation) ([place]) 10/03/2019   HFrEF (heart failure with  reduced ejection fraction) ([place]) 09/28/2019   Severe aortic stenosis 09/28/2019   Anemia of chronic disease 09/27/2019   Protein calorie malnutrition ([place])    PVD (peripheral vascular disease) ([place])    Umbilical hernia 49/67/5916   Obesity 10/09/2015   Tobacco abuse 09/07/2013   DM (diabetes mellitus), type 2 with neurological complications ([place]) 38/46/6599   Hypercholesteremia 10/28/2010   ERECTILE DYSFUNCTION 05/22/2009   Essential hypertension 01/23/2009    Past Medical History: Past Medical History:  Diagnosis Date   Aortic stenosis    moderate in 2022   Atrial fibrillation (HCC)    CHF (congestive heart failure) ([place])    Coronary artery disease    Diabetes mellitus without complication ([place])    HLD (hyperlipidemia)    Hypertension    Peripheral arterial disease ([place])     Past Surgical History: Past Surgical History:  Procedure Laterality Date   ABDOMINAL AORTOGRAM W/LOWER EXTREMITY N/A 08/05/2020   Procedure: ABDOMINAL AORTOGRAM W/LOWER EXTREMITY;  Surgeon: Marty Heck, MD;  Location: [facility];  Service: Cardiovascular;  Laterality: N/A;   ABDOMINAL AORTOGRAM W/LOWER EXTREMITY N/A 11/13/2020   Procedure: ABDOMINAL AORTOGRAM W/LOWER EXTREMITY;  Surgeon: Cherre Robins, MD;  Location: [facility];  Service: Cardiovascular;  Laterality: N/A;   ABDOMINAL AORTOGRAM W/LOWER EXTREMITY N/A 05/12/2021   Procedure: ABDOMINAL AORTOGRAM W/LOWER EXTREMITY;  Surgeon: Waynetta Sandy, MD;  Location: [facility];  Service: Cardiovascular;  Laterality: N/A;   AMPUTATION Left 09/28/2019   Procedure: AMPUTATION BELOW KNEE;  Surgeon: Newt Minion, MD;  Location: [facility];  Service: Orthopedics;  Laterality: Left;   AMPUTATION Right 11/15/2020   Procedure: RIGHT GREAT TOE AMPUTATION;  Surgeon: Newt Minion, MD;  Location: [facility];  Service: Orthopedics;  Laterality: Right;   CARDIAC CATHETERIZATION     CARDIOVERSION N/A 10/05/2019   Procedure:  CARDIOVERSION;  Surgeon: Sanda Klein, MD;  Location: [facility];  Service: Cardiovascular;  Laterality: N/A;   FEMORAL-POPLITEAL BYPASS GRAFT Right 08/07/2020   Procedure: RIGHT FEMORAL TO BELOW KNEE POPLITEAL ARTERY BYPASS;  Surgeon: Waynetta Sandy, MD;  Location: [facility];  Service: Vascular;  Laterality: Right;   LEFT HEART CATH AND CORONARY ANGIOGRAPHY N/A 10/03/2019   Procedure: LEFT HEART CATH AND CORONARY ANGIOGRAPHY;  Surgeon: Lorretta Harp, MD;  Location: [facility];  Service: Cardiovascular;  Laterality: N/A;   PERIPHERAL VASCULAR INTERVENTION Right 08/05/2020   Procedure: PERIPHERAL VASCULAR INTERVENTION;  Surgeon: Marty Heck, MD;  Location: [facility];  Service: Cardiovascular;  Laterality: Right;  common Iliac  PERIPHERAL VASCULAR INTERVENTION Left 11/13/2020   Procedure: PERIPHERAL VASCULAR INTERVENTION;  Surgeon: Cherre Robins, MD;  Location: [facility];  Service: Cardiovascular;  Laterality: Left;   PERIPHERAL VASCULAR INTERVENTION Right 11/14/2020   Procedure: PERIPHERAL VASCULAR INTERVENTION;  Surgeon: Marty Heck, MD;  Location: [facility];  Service: Cardiovascular;  Laterality: Right;  POP/SFA STENT   PERIPHERAL VASCULAR INTERVENTION  05/12/2021   Procedure: PERIPHERAL VASCULAR INTERVENTION;  Surgeon: Waynetta Sandy, MD;  Location: [facility];  Service: Cardiovascular;;   TEE WITHOUT CARDIOVERSION N/A 10/05/2019   Procedure: TRANSESOPHAGEAL ECHOCARDIOGRAM (TEE);  Surgeon: Sanda Klein, MD;  Location: [facility];  Service: Cardiovascular;  Laterality: N/A;    Social History: Social History   Tobacco Use   Smoking status: Former    Packs/day: 0.50    Years: 50.00    Pack years: 25.00    Types: Cigarettes    Start date: 04/09/2021   Smokeless tobacco: Never  Vaping Use   Vaping Use: Never used  Substance Use Topics   Alcohol use: Yes    Alcohol/week: 6.0 standard drinks    Types:  6 Standard drinks or equivalent per week    Comment: 11/07/20 - states he has not drank in 6 months   Drug use: No   Additional social history: lives with girlfriend who helps with caretaking as well Please also refer to relevant sections of EMR.  Family History: Family History  Problem Relation Age of Onset   Alcoholism Mother    Alcoholism Father     Allergies and Medications: No Known Allergies No current facility-administered medications on file prior to encounter.   Current Outpatient Medications on File Prior to Encounter  Medication Sig Dispense Refill   acetaminophen (TYLENOL) 650 MG CR tablet Take 1,300 mg by mouth in the morning and at bedtime.     albuterol (VENTOLIN HFA) 108 (90 Base) MCG/ACT inhaler INHALE 2 PUFFS INTO THE LUNGS EVERY 6 (SIX) HOURS AS NEEDED FOR WHEEZING OR SHORTNESS OF BREATH. 1 each 0   amiodarone (PACERONE) 200 MG tablet Take 1 tablet (200 mg total) by mouth daily. 90 tablet 3   atorvastatin (LIPITOR) 80 MG tablet TAKE 1 TABLET EVERY DAY (Patient taking differently: Take 80 mg by mouth daily.) 90 tablet 0   carvedilol (COREG) 12.5 MG tablet Take 6.25 mg by mouth 2 (two) times daily with a meal.     clopidogrel (PLAVIX) 75 MG tablet TAKE 1 TABLET EVERY DAY WITH BREAKFAST (Patient taking differently: Take 75 mg by mouth daily.) 90 tablet 3   ferrous sulfate 325 (65 FE) MG tablet Take 1 tablet (325 mg total) by mouth every other day. (Patient taking differently: Take 325 mg by mouth daily with breakfast.) 30 tablet 0   fluticasone (FLONASE) 50 MCG/ACT nasal spray Place 2 sprays into both nostrils daily. 16 g 6   furosemide (LASIX) 40 MG tablet Take 1 tablet (40 mg total) by mouth daily as needed for fluid (swelling). (Patient taking differently: Take 40 mg by mouth daily.) 30 tablet 1   gabapentin (NEURONTIN) 300 MG capsule Take 600 mg by mouth at bedtime.     insulin detemir (LEVEMIR) 100 UNIT/ML FlexPen Inject 25 Units into the skin daily. 15 mL 11    lactose free nutrition (BOOST) LIQD Take 237 mLs by mouth 3 (three) times a week.     metFORMIN (GLUCOPHAGE) 1000 MG tablet Take 1 tablet (1,000 mg total) by mouth daily. 90 tablet 3  pantoprazole (PROTONIX) 40 MG tablet Take 1 tablet (40 mg total) by mouth 2 (two) times daily. 60 tablet 0   traZODone (DESYREL) 50 MG tablet Take 0.5-1 tablets (25-50 mg total) by mouth at bedtime as needed for sleep. 30 tablet 3   Blood Glucose Monitoring Suppl (TRUE METRIX METER) DEVI Use to test blood sugar three times daily. (Patient not taking: Reported on 05/07/2021) 1 each 0   Blood Glucose Monitoring Suppl (TRUE METRIX METER) w/Device KIT USE AS DIRECTED (Patient not taking: Reported on 05/07/2021) 1 kit 0   glucose blood (RELION TRUE METRIX TEST STRIPS) test strip Use to test blood sugar three times per day. (Patient not taking: Reported on 05/07/2021) 300 each 3   Insulin Pen Needle 32G X 4 MM MISC Use to inject insulin up to 4 times daily as directed, 100 each 0   ipratropium-albuterol (DUONEB) 0.5-2.5 (3) MG/3ML SOLN Take 3 mLs by nebulization every 4 (four) hours as needed. 300 mL 3   Nutritional Supplements (FEEDING SUPPLEMENT, NEPRO CARB STEADY,) LIQD Take 237 mLs by mouth 2 (two) times daily between meals. (Patient not taking: Reported on 05/07/2021)  0   polyethylene glycol (MIRALAX / GLYCOLAX) 17 g packet Take 17 g by mouth daily as needed for moderate constipation. (Patient not taking: Reported on 05/01/2021) 14 each 0   TRUEplus Lancets 33G MISC Use to test blood sugar three times per day. (Patient not taking: Reported on 05/07/2021) 300 each 3    Objective: BP (!) 154/78    Pulse (!) 58    Temp 98.3 F (36.8 C) (Oral)    Resp (!) 21    SpO2 97%  Exam: General: NAD, laying in bed upright, alert and responsive to questions Eyes: No erythema or drainage, tracking movements ENTM: Normocephalic, atraumatic, no nasal drainage present Neck: Range of motion intact Cardiovascular: Severe sytolic murmur on  examination, regular rate, sinus rhythm Respiratory: Clear to auscultation bilaterally, no wheezes rales or crackles auscultated, mildly diminished in bases bilaterally Gastrointestinal: Distended, nontender to palpation, mildly firm, normoactive bowel sounds MSK: Right lower extremity with 2+ edema and chronic venous wounds on anterior shin as well as necrotic appearance to bottom of toes, chronic ulcers present without drainage.  Left lower extremity BKA    Derm: Wounds as pictured above on right lower extremity Neuro: No focal deficits Psych: Mood appropriate  Labs and Imaging: CBC BMET  Recent Labs  Lab 05/28/21 1846  WBC 6.8  HGB 8.7*  HCT 29.5*  PLT 229   Recent Labs  Lab 05/28/21 1846  NA 138  K 5.3*  CL 102  CO2 28  BUN 30*  CREATININE 1.43*  GLUCOSE 169*  CALCIUM 8.5*     EKG: NSR, No acute ST changes, PACs   DG Chest Portable 1 View  Result Date: 05/28/2021 CLINICAL DATA:  Cough, short of breath EXAM: PORTABLE CHEST 1 VIEW COMPARISON:  05/06/2021 FINDINGS: Single frontal view of the chest demonstrates stable enlargement of the cardiac silhouette. There is persistent central vascular congestion, with interval increase in bibasilar consolidation as well as enlarging bilateral pleural effusions. The left pleural effusion is at least partially loculated. No pneumothorax. No acute bony abnormalities. IMPRESSION: 1. Progressive bibasilar consolidation and effusions, left greater than right, with persistent central vascular congestion. While the findings likely reflects worsening congestive heart failure, underlying infection cannot be excluded. Electronically Signed   By: Randa Ngo M.D.   On: 05/28/2021 19:20     Gerrit Heck, MD 05/28/2021,  10:18 PM PGY-1, [place] Intern pager: [phone], text pages welcome   FPTS Upper-Level Resident Addendum   I have independently interviewed and examined the patient. I have discussed the above with  the original author and agree with their documentation. My edits for correction/addition/clarification are in within the document. Please see also any attending notes.   Lyndee Hensen, DO PGY-3, [facility] 05/29/2021 6:14 AM  FPTS Service pager: [phone] (text pages welcome through [place])

## 2021-05-28 NOTE — ED Triage Notes (Addendum)
Patient arrived via [facility] with complaints respiratory distress x2 hours. Patient states he sudden start coughing and shortness of breath. He was placed on CPAP by EMS and he states he felt better then.

## 2021-05-29 ENCOUNTER — Observation Stay (HOSPITAL_COMMUNITY): Payer: Medicare PPO

## 2021-05-29 ENCOUNTER — Ambulatory Visit: Payer: Medicare PPO | Admitting: Family Medicine

## 2021-05-29 DIAGNOSIS — I130 Hypertensive heart and chronic kidney disease with heart failure and stage 1 through stage 4 chronic kidney disease, or unspecified chronic kidney disease: Secondary | ICD-10-CM | POA: Diagnosis present

## 2021-05-29 DIAGNOSIS — E7800 Pure hypercholesterolemia, unspecified: Secondary | ICD-10-CM | POA: Diagnosis present

## 2021-05-29 DIAGNOSIS — R0603 Acute respiratory distress: Secondary | ICD-10-CM | POA: Diagnosis not present

## 2021-05-29 DIAGNOSIS — I509 Heart failure, unspecified: Secondary | ICD-10-CM | POA: Diagnosis not present

## 2021-05-29 DIAGNOSIS — E1149 Type 2 diabetes mellitus with other diabetic neurological complication: Secondary | ICD-10-CM | POA: Diagnosis present

## 2021-05-29 DIAGNOSIS — E875 Hyperkalemia: Secondary | ICD-10-CM | POA: Diagnosis present

## 2021-05-29 DIAGNOSIS — J440 Chronic obstructive pulmonary disease with acute lower respiratory infection: Secondary | ICD-10-CM | POA: Diagnosis present

## 2021-05-29 DIAGNOSIS — J449 Chronic obstructive pulmonary disease, unspecified: Secondary | ICD-10-CM | POA: Diagnosis not present

## 2021-05-29 DIAGNOSIS — R0602 Shortness of breath: Secondary | ICD-10-CM | POA: Diagnosis not present

## 2021-05-29 DIAGNOSIS — E1142 Type 2 diabetes mellitus with diabetic polyneuropathy: Secondary | ICD-10-CM | POA: Diagnosis present

## 2021-05-29 DIAGNOSIS — U071 COVID-19: Secondary | ICD-10-CM | POA: Diagnosis not present

## 2021-05-29 DIAGNOSIS — J189 Pneumonia, unspecified organism: Secondary | ICD-10-CM | POA: Diagnosis present

## 2021-05-29 DIAGNOSIS — J90 Pleural effusion, not elsewhere classified: Secondary | ICD-10-CM | POA: Diagnosis not present

## 2021-05-29 DIAGNOSIS — F1290 Cannabis use, unspecified, uncomplicated: Secondary | ICD-10-CM | POA: Diagnosis present

## 2021-05-29 DIAGNOSIS — I5033 Acute on chronic diastolic (congestive) heart failure: Secondary | ICD-10-CM | POA: Diagnosis present

## 2021-05-29 DIAGNOSIS — J1282 Pneumonia due to coronavirus disease 2019: Secondary | ICD-10-CM | POA: Diagnosis present

## 2021-05-29 DIAGNOSIS — I482 Chronic atrial fibrillation, unspecified: Secondary | ICD-10-CM | POA: Diagnosis present

## 2021-05-29 DIAGNOSIS — J9621 Acute and chronic respiratory failure with hypoxia: Secondary | ICD-10-CM

## 2021-05-29 DIAGNOSIS — Z89512 Acquired absence of left leg below knee: Secondary | ICD-10-CM | POA: Diagnosis not present

## 2021-05-29 DIAGNOSIS — L03115 Cellulitis of right lower limb: Secondary | ICD-10-CM | POA: Diagnosis present

## 2021-05-29 DIAGNOSIS — E1151 Type 2 diabetes mellitus with diabetic peripheral angiopathy without gangrene: Secondary | ICD-10-CM | POA: Diagnosis present

## 2021-05-29 DIAGNOSIS — N1831 Chronic kidney disease, stage 3a: Secondary | ICD-10-CM | POA: Diagnosis present

## 2021-05-29 DIAGNOSIS — I350 Nonrheumatic aortic (valve) stenosis: Secondary | ICD-10-CM | POA: Diagnosis present

## 2021-05-29 DIAGNOSIS — D638 Anemia in other chronic diseases classified elsewhere: Secondary | ICD-10-CM | POA: Diagnosis present

## 2021-05-29 DIAGNOSIS — I480 Paroxysmal atrial fibrillation: Secondary | ICD-10-CM | POA: Diagnosis present

## 2021-05-29 DIAGNOSIS — I2510 Atherosclerotic heart disease of native coronary artery without angina pectoris: Secondary | ICD-10-CM | POA: Diagnosis present

## 2021-05-29 DIAGNOSIS — F1490 Cocaine use, unspecified, uncomplicated: Secondary | ICD-10-CM | POA: Diagnosis present

## 2021-05-29 DIAGNOSIS — E1122 Type 2 diabetes mellitus with diabetic chronic kidney disease: Secondary | ICD-10-CM | POA: Diagnosis present

## 2021-05-29 DIAGNOSIS — Z6832 Body mass index (BMI) 32.0-32.9, adult: Secondary | ICD-10-CM | POA: Diagnosis not present

## 2021-05-29 DIAGNOSIS — E46 Unspecified protein-calorie malnutrition: Secondary | ICD-10-CM | POA: Diagnosis present

## 2021-05-29 DIAGNOSIS — I517 Cardiomegaly: Secondary | ICD-10-CM | POA: Diagnosis not present

## 2021-05-29 DIAGNOSIS — D649 Anemia, unspecified: Secondary | ICD-10-CM | POA: Diagnosis not present

## 2021-05-29 HISTORY — DX: Pneumonia due to coronavirus disease 2019: J12.82

## 2021-05-29 HISTORY — DX: COVID-19: U07.1

## 2021-05-29 LAB — CBC
HCT: 27.6 % — ABNORMAL LOW (ref 39.0–52.0)
Hemoglobin: 8.2 g/dL — ABNORMAL LOW (ref 13.0–17.0)
MCH: 25.8 pg — ABNORMAL LOW (ref 26.0–34.0)
MCHC: 29.7 g/dL — ABNORMAL LOW (ref 30.0–36.0)
MCV: 86.8 fL (ref 80.0–100.0)
Platelets: 200 10*3/uL (ref 150–400)
RBC: 3.18 MIL/uL — ABNORMAL LOW (ref 4.22–5.81)
RDW: 19.1 % — ABNORMAL HIGH (ref 11.5–15.5)
WBC: 5 10*3/uL (ref 4.0–10.5)
nRBC: 0 % (ref 0.0–0.2)

## 2021-05-29 LAB — COMPREHENSIVE METABOLIC PANEL
ALT: 25 U/L (ref 0–44)
AST: 18 U/L (ref 15–41)
Albumin: 2.4 g/dL — ABNORMAL LOW (ref 3.5–5.0)
Alkaline Phosphatase: 95 U/L (ref 38–126)
Anion gap: 9 (ref 5–15)
BUN: 29 mg/dL — ABNORMAL HIGH (ref 8–23)
CO2: 29 mmol/L (ref 22–32)
Calcium: 8.2 mg/dL — ABNORMAL LOW (ref 8.9–10.3)
Chloride: 100 mmol/L (ref 98–111)
Creatinine, Ser: 1.24 mg/dL (ref 0.61–1.24)
GFR, Estimated: >60 mL/min (ref >60)
Glucose, Bld: 127 mg/dL — ABNORMAL HIGH (ref 70–99)
Potassium: 4.1 mmol/L (ref 3.5–5.1)
Sodium: 138 mmol/L (ref 135–145)
Total Bilirubin: 0.6 mg/dL (ref 0.3–1.2)
Total Protein: 5.8 g/dL — ABNORMAL LOW (ref 6.5–8.1)

## 2021-05-29 LAB — CBG MONITORING, ED
Glucose-Capillary: 130 mg/dL — ABNORMAL HIGH (ref 70–99)
Glucose-Capillary: 159 mg/dL — ABNORMAL HIGH (ref 70–99)

## 2021-05-29 LAB — GLUCOSE, CAPILLARY
Glucose-Capillary: 167 mg/dL — ABNORMAL HIGH (ref 70–99)
Glucose-Capillary: 187 mg/dL — ABNORMAL HIGH (ref 70–99)

## 2021-05-29 LAB — TROPONIN I (HIGH SENSITIVITY): Troponin I (High Sensitivity): 31 ng/L — ABNORMAL HIGH (ref <18)

## 2021-05-29 IMAGING — DX DG CHEST 2V
4 series · 4 of 4 positions shown · non-contrast
Comparison: [DATE]

CLINICAL DATA: Respiratory distress, shortness of breath 3 months,
COVID positive

EXAM:
CHEST - 2 VIEW

[chest lat (1 of 2)]
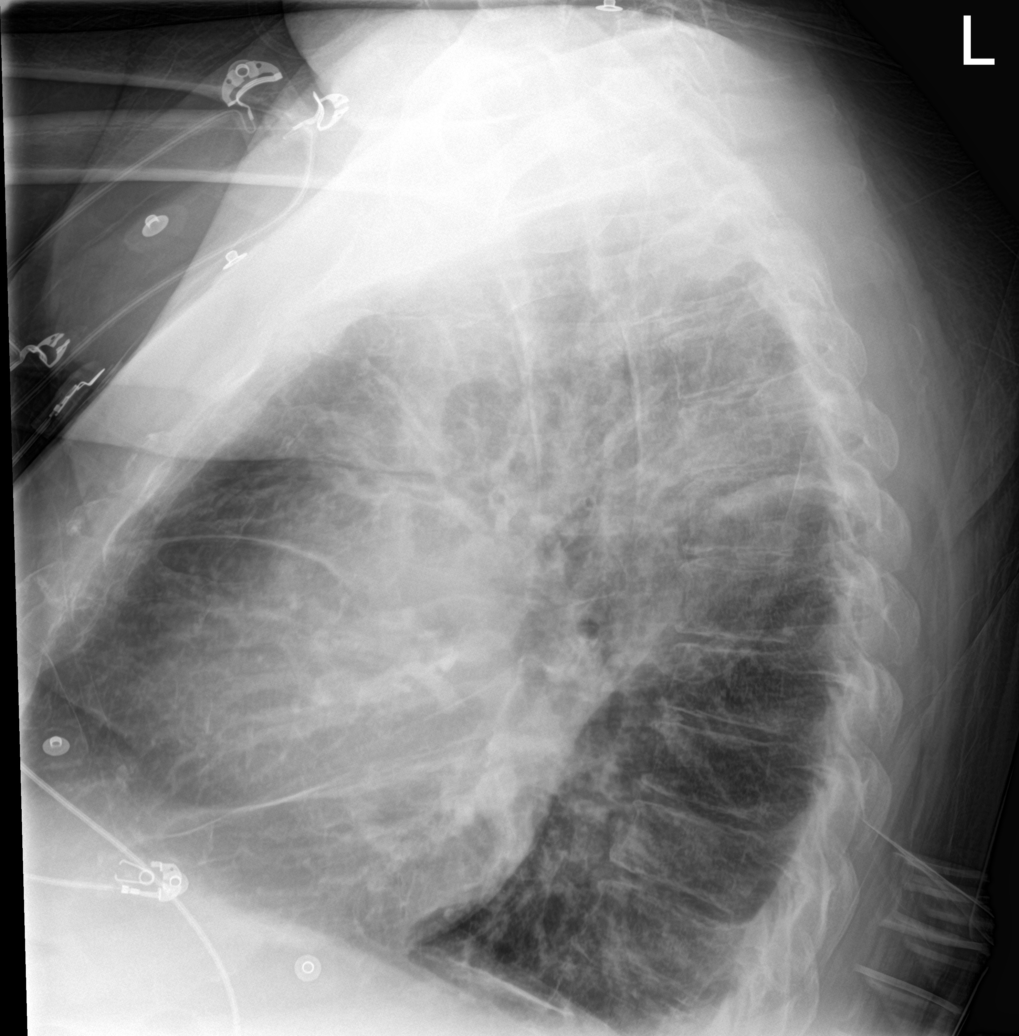

[chest ap (1 of 2)]
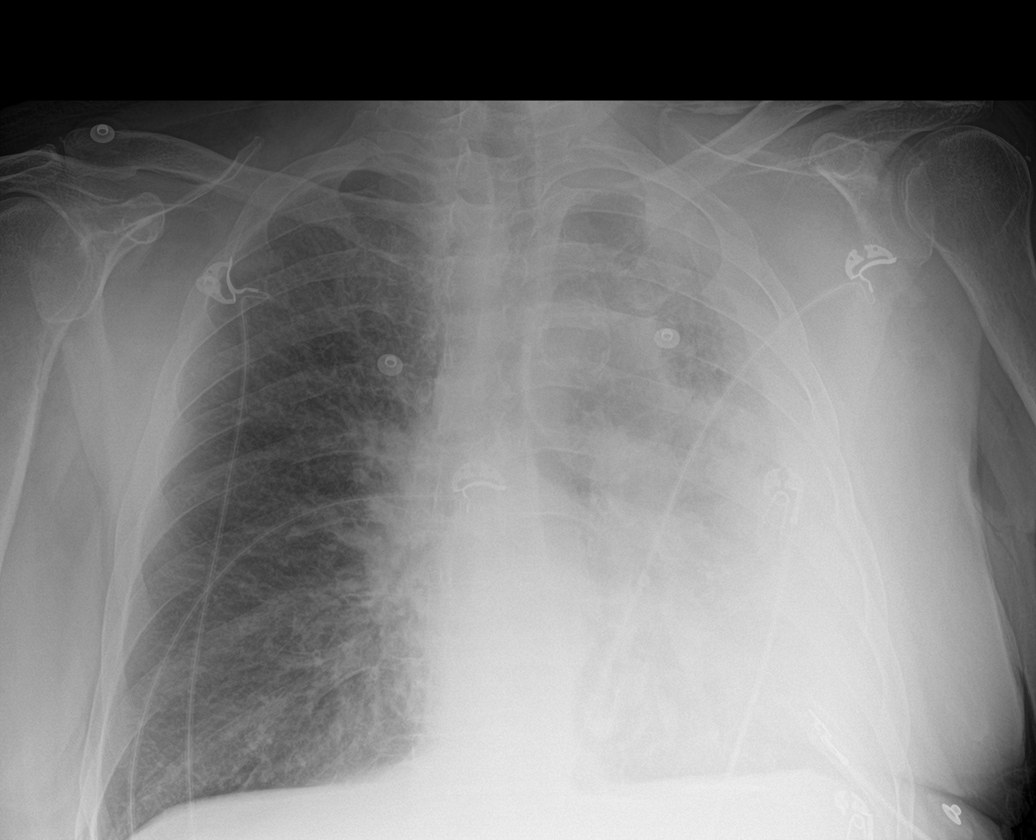

[chest lat (2 of 2)]
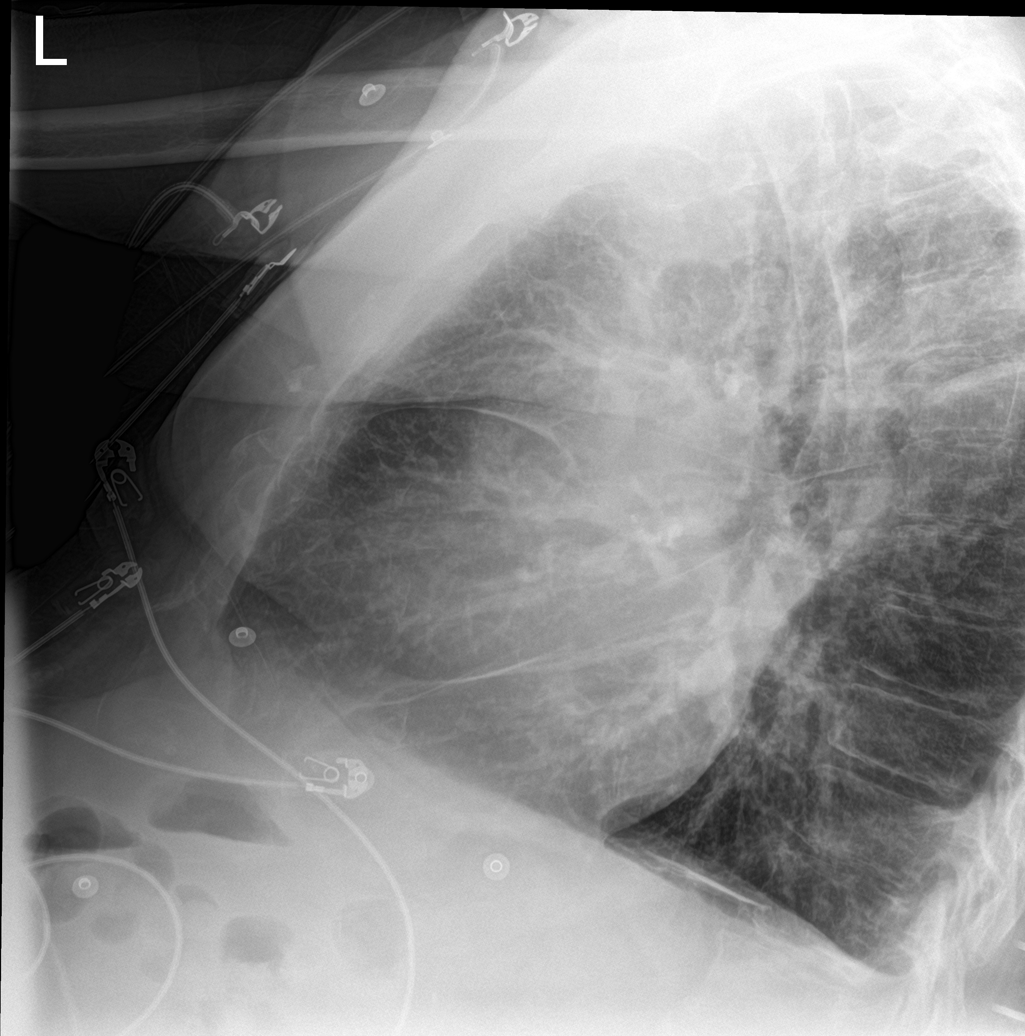

[chest ap (2 of 2)]
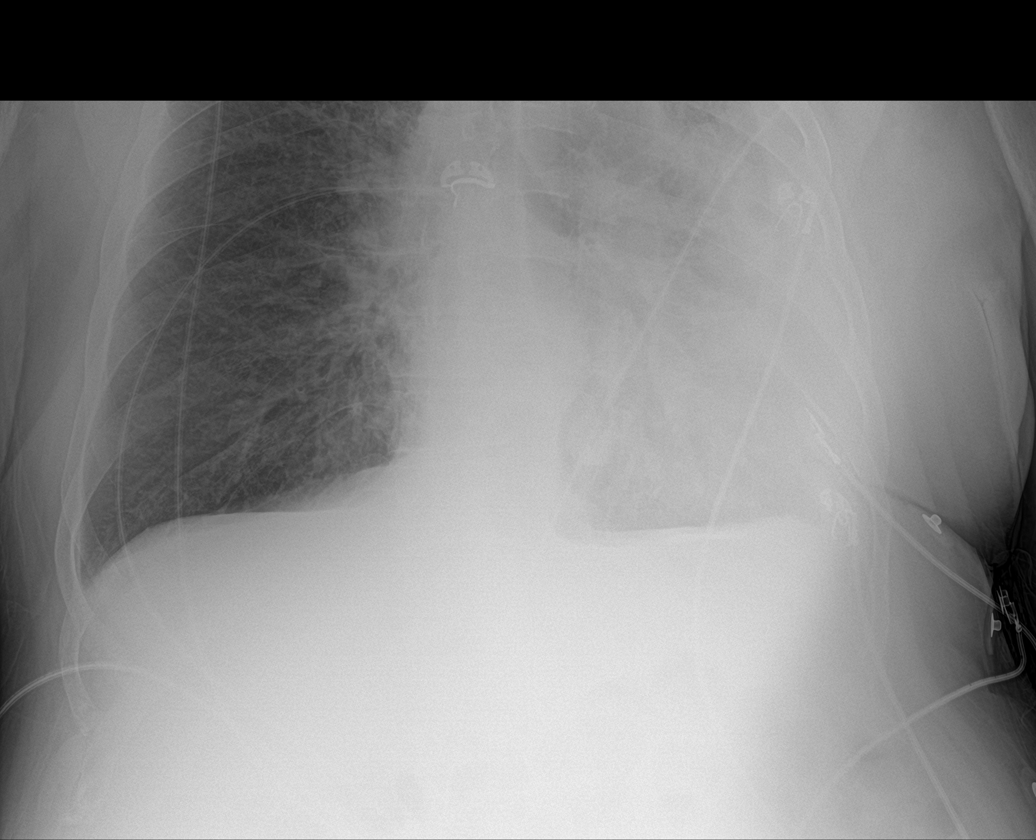

[4 of 4 positions shown; findings below may reference images not displayed]

FINDINGS: Interval increase in a loculated left pleural effusion, with
increased atelectasis or consolidation of the left lung. Mild,
diffuse interstitial opacity throughout the aerated right lung.
Gross cardiomegaly.
IMPRESSION: 1. Interval increase in a loculated left pleural effusion, with
increased atelectasis or consolidation of the left lung.

2. Diffuse interstitial opacity throughout the aerated right lung,
most consistent with edema.

3.  Cardiomegaly.

## 2021-05-29 MED ORDER — AMIODARONE HCL 200 MG PO TABS
200.0000 mg | ORAL_TABLET | Freq: Every day | ORAL | Status: DC
  Administered 2021-05-29 – 2021-06-01 (×4): 200 mg via ORAL
  Filled 2021-05-29 (×4): qty 1

## 2021-05-29 MED ORDER — TRAZODONE HCL 50 MG PO TABS
25.0000 mg | ORAL_TABLET | Freq: Every evening | ORAL | Status: DC | PRN
  Administered 2021-05-29 – 2021-05-31 (×2): 50 mg via ORAL
  Filled 2021-05-29 (×2): qty 1

## 2021-05-29 MED ORDER — FLUTICASONE PROPIONATE 50 MCG/ACT NA SUSP
2.0000 | Freq: Every day | NASAL | Status: DC
  Administered 2021-05-29 – 2021-06-01 (×4): 2 via NASAL
  Filled 2021-05-29: qty 16

## 2021-05-29 MED ORDER — INSULIN ASPART 100 UNIT/ML IJ SOLN
0.0000 [IU] | Freq: Three times a day (TID) | INTRAMUSCULAR | Status: DC
  Administered 2021-05-29 – 2021-05-30 (×3): 2 [IU] via SUBCUTANEOUS
  Administered 2021-05-30 – 2021-06-01 (×6): 3 [IU] via SUBCUTANEOUS
  Administered 2021-06-01: 5 [IU] via SUBCUTANEOUS

## 2021-05-29 MED ORDER — FUROSEMIDE 40 MG PO TABS
40.0000 mg | ORAL_TABLET | Freq: Every day | ORAL | Status: DC
  Administered 2021-05-29 – 2021-06-01 (×4): 40 mg via ORAL
  Filled 2021-05-29: qty 1
  Filled 2021-05-29: qty 2
  Filled 2021-05-29 (×2): qty 1

## 2021-05-29 MED ORDER — PANTOPRAZOLE SODIUM 40 MG PO TBEC
40.0000 mg | DELAYED_RELEASE_TABLET | Freq: Two times a day (BID) | ORAL | Status: DC
  Administered 2021-05-29 – 2021-06-01 (×8): 40 mg via ORAL
  Filled 2021-05-29 (×8): qty 1

## 2021-05-29 MED ORDER — CLOPIDOGREL BISULFATE 75 MG PO TABS
75.0000 mg | ORAL_TABLET | Freq: Every day | ORAL | Status: DC
  Administered 2021-05-29 – 2021-06-01 (×4): 75 mg via ORAL
  Filled 2021-05-29 (×4): qty 1

## 2021-05-29 MED ORDER — ENOXAPARIN SODIUM 60 MG/0.6ML IJ SOSY
50.0000 mg | PREFILLED_SYRINGE | INTRAMUSCULAR | Status: DC
  Administered 2021-05-29 – 2021-06-01 (×4): 50 mg via SUBCUTANEOUS
  Filled 2021-05-29 (×2): qty 0.6
  Filled 2021-05-29: qty 0.5
  Filled 2021-05-29: qty 0.6

## 2021-05-29 MED ORDER — GABAPENTIN 300 MG PO CAPS
600.0000 mg | ORAL_CAPSULE | Freq: Every day | ORAL | Status: DC
  Administered 2021-05-29 – 2021-05-31 (×3): 600 mg via ORAL
  Filled 2021-05-29 (×4): qty 2

## 2021-05-29 MED ORDER — GUAIFENESIN-DM 100-10 MG/5ML PO SYRP: 10.0000 mL | ORAL_SOLUTION | ORAL | Status: DC | PRN

## 2021-05-29 MED ORDER — IPRATROPIUM-ALBUTEROL 0.5-2.5 (3) MG/3ML IN SOLN
3.0000 mL | Freq: Two times a day (BID) | RESPIRATORY_TRACT | Status: DC
  Administered 2021-05-30 – 2021-06-01 (×4): 3 mL via RESPIRATORY_TRACT
  Filled 2021-05-29 (×6): qty 3

## 2021-05-29 MED ORDER — DEXAMETHASONE 6 MG PO TABS
6.0000 mg | ORAL_TABLET | ORAL | Status: DC
  Administered 2021-05-29 – 2021-06-01 (×4): 6 mg via ORAL
  Filled 2021-05-29 (×4): qty 1
  Filled 2021-05-29: qty 2

## 2021-05-29 MED ORDER — IPRATROPIUM-ALBUTEROL 0.5-2.5 (3) MG/3ML IN SOLN
3.0000 mL | RESPIRATORY_TRACT | Status: DC
  Administered 2021-05-29 (×4): 3 mL via RESPIRATORY_TRACT
  Filled 2021-05-29 (×4): qty 3

## 2021-05-29 MED ORDER — ALBUTEROL SULFATE (2.5 MG/3ML) 0.083% IN NEBU: 3.0000 mL | INHALATION_SOLUTION | Freq: Four times a day (QID) | RESPIRATORY_TRACT | Status: DC | PRN

## 2021-05-29 MED ORDER — SODIUM CHLORIDE 0.9 % IV SOLN
100.0000 mg | Freq: Every day | INTRAVENOUS | Status: DC
  Filled 2021-05-29: qty 20

## 2021-05-29 MED ORDER — FERROUS SULFATE 325 (65 FE) MG PO TABS: 325.0000 mg | ORAL_TABLET | ORAL | Status: DC

## 2021-05-29 MED ORDER — BOOST PO LIQD
237.0000 mL | ORAL | Status: DC
  Administered 2021-05-30: 237 mL via ORAL
  Filled 2021-05-29 (×2): qty 237

## 2021-05-29 MED ORDER — INSULIN DETEMIR 100 UNIT/ML ~~LOC~~ SOLN
15.0000 [IU] | Freq: Every day | SUBCUTANEOUS | Status: DC
  Administered 2021-05-29 – 2021-06-01 (×4): 15 [IU] via SUBCUTANEOUS
  Filled 2021-05-29 (×4): qty 0.15

## 2021-05-29 MED ORDER — UMECLIDINIUM BROMIDE 62.5 MCG/ACT IN AEPB
1.0000 | INHALATION_SPRAY | Freq: Every day | RESPIRATORY_TRACT | Status: DC
  Administered 2021-05-30 – 2021-06-01 (×3): 1 via RESPIRATORY_TRACT
  Filled 2021-05-29: qty 7

## 2021-05-29 MED ORDER — SODIUM CHLORIDE 0.9 % IV SOLN
100.0000 mg | Freq: Every day | INTRAVENOUS | Status: DC
  Administered 2021-05-30 – 2021-06-01 (×3): 100 mg via INTRAVENOUS
  Filled 2021-05-29 (×2): qty 20
  Filled 2021-05-29: qty 100

## 2021-05-29 MED ORDER — SODIUM CHLORIDE 0.9 % IV SOLN
200.0000 mg | Freq: Once | INTRAVENOUS | Status: AC
  Administered 2021-05-29: 200 mg via INTRAVENOUS
  Filled 2021-05-29: qty 40

## 2021-05-29 MED ORDER — INSULIN DETEMIR 100 UNIT/ML ~~LOC~~ SOLN
10.0000 [IU] | Freq: Every day | SUBCUTANEOUS | Status: DC
  Filled 2021-05-29: qty 0.1

## 2021-05-29 MED ORDER — FLUTICASONE FUROATE-VILANTEROL 100-25 MCG/ACT IN AEPB
1.0000 | INHALATION_SPRAY | Freq: Every day | RESPIRATORY_TRACT | Status: DC
  Administered 2021-05-30 – 2021-06-01 (×3): 1 via RESPIRATORY_TRACT
  Filled 2021-05-29: qty 28

## 2021-05-29 MED ORDER — CARVEDILOL 6.25 MG PO TABS
6.2500 mg | ORAL_TABLET | Freq: Two times a day (BID) | ORAL | Status: DC
  Administered 2021-05-29 – 2021-06-01 (×8): 6.25 mg via ORAL
  Filled 2021-05-29 (×7): qty 1
  Filled 2021-05-29: qty 2

## 2021-05-29 MED ORDER — ATORVASTATIN CALCIUM 80 MG PO TABS
80.0000 mg | ORAL_TABLET | Freq: Every day | ORAL | Status: DC
  Administered 2021-05-29 – 2021-06-01 (×4): 80 mg via ORAL
  Filled 2021-05-29: qty 1
  Filled 2021-05-29: qty 2
  Filled 2021-05-29 (×2): qty 1

## 2021-05-29 NOTE — Consult Note (Signed)
WOC Nurse Consult Note: Patient admitted for Covid related pneumonia and acute respiratory distress  Has left BKA and PVD.  Recent right lower extremity angiogram last month with some improvement to open ulcers.  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 ABI showed noncompressible vessels to right leg. Severe aortic sclerosis present as well.  Remains at high risk for infection and ischemia to this limb.  Will implement topical maintenance treatment.    Reason for Consult: nonhealing wounds to four remaining right toes, right plantar foot.  Right dorsal foot and right anterior lower leg with nonintact wounds from previous blistering, improved.  Generalized edema present, which is baselline for this patient.  Wound type:vascular Pressure Injury POA: NA Measurement: Right plantar foot:  2 cm x 1.5 cm necrotic lesion, extends to base of 4th and 5th metatarsals, plantar aspect.  Tips of second through fifth metatarsal are black and dry.  Right dorsal foot  erythema 3 cm x 4 cm with 1 cm x 2 cm x 0.1 cm nonintact center near base of toes. Right anterior lower leg with resolving blister.  Nonintact wound 3 cm x 2.5 cm x 0.1 cm noted Wound QMG:QQPY and moist to foot and leg, toe and plantar wounds are black and dry.  Drainage (amount, consistency, odor) none noted Periwound:right leg is dry and crusted Dressing procedure/placement/frequency: Cleanse wounds to right lower leg and foot with NS and pat dry. Apply betadine to tips of toes daily and cut alginate (LAWSON # [id number])  into strips and weave between toes, covering necrotic lesions to plantar toes and foot.  Alginate to dorsal foot and anterior lower leg wound.  Cover with dry gauze and kerlix.  Change daily.  Will not follow at this time.  Please re-consult if needed.  Domenic Moras MSN, RN, FNP-BC CWON Wound, Ostomy, Continence Nurse Pager [phone]

## 2021-05-29 NOTE — Progress Notes (Signed)
Family Medicine Teaching Service Daily Progress Note Intern Pager: [phone]  Patient name: Rodney [name] Medical record number: [id number] Date of birth: 12-20-1950 Age: 71 y.o. Gender: male  Primary Care Provider: [name], MD Consultants: None Code Status: Full  Pt Overview and Major Events to Date:  2/1- admitted  Assessment and Plan: Rodney [name] is a 71 y.o. male presenting with shortness of breath and productive cough found to be COVID+. PMH is significant for HFpEF, hypertension, COPD, hyperlipidemia, tobacco use, T2DM, protein calorie malnutrition, PVD, aortic stenosis, PAF, s/p left BKA, CAD, CKD stage IIIa.  Acute on Chronic Hypoxic Respiratory Failure   COVID+   COPD VSS stable overnight, one soft BP this morning to 95/49. Otherwise satting appropriately on 3L/min. Home requirement is 2.5L/min. CXR with bibasilar consildation and effusions L>R. Remains without an elevated WBC. S/P CAT x 31min, Solu-Medrol 125mg , magnesium, Lasix. Was placed on cefepime, vancomycin, and flagyl in the ED due to concern for pneumonia on XR and ?cellulitis of R foot.  - Will obtain 2 view CXR to better characterize airspace disease - Remdesivir (1/5) - Decadron (1/5) - Duonebs q4h - Continue home inhalers (Breo and Incruse inpatient, on trellegy at home) - Can discontinue abx given negative lactate, no WBC, fever, or evidence of infection on exam  Dark Stools Normocytic Anemia Hgb 8.7>8.2, which is consistent with his recent baseline. He is taking Protonix BID. Consider that stool changes could be from his home ferrous sulfate and may not represent active bleeding in the setting of normal CBC.  - Continue to trend daily CBC - Protonix BID - Holding home ferrous sulfate  Right Foot Wounds   PVD   S/p SFA stenting  R second toe with area of necrosis, no evidence of infection. Foot was previously evaluated by [facility] at his last hospitalization and they offered amputation at that  time but patient declined at that time.  - Continue Plavix and Lipitor - WOC following, appreviate recs  HFpEF   Severe AS Echo 03/24/21 with LVEF 50-55% and severe aortic stenosis. BNP around his baseline at 594. Trops 32>37>31. Chronic RLE edema is present, but does not seem otherwise hypervolemic. S/p IV Lasix 40mg  x1 in the ED and has put out -2.5L.  - Will transition to home Lasix (40mg  PO daily) and Coreg 6.25mg  BID  Paroxysmal A Fib, chronic, stable Currently in sinus rhythm, Rate controlled to 60s.  - Cardiac monitoring - Continue home amiodarone 200mg  daily  T2DM, chronic, stable Anticipate increased insulin requirement in setting of steroid therapy. Home regiment is 25u Levemir daily.  - Will give 15u Levemir  - Trend glucose   FEN/GI: Heart healthy, carb-modified PPx: Lovenox Dispo: Pending clinical improvement   Subjective:  Mr. Armendariz reports that he feels much better this morning compared to admission. He says that his dyspnea was much improved with CPAP administered by EMS and that he feels much better now. He is unable to lie flat on his back, but this is his baseline. He reports that his leg swelling is at his baseline.   Objective: Temp:  [98.3 F (36.8 C)] 98.3 F (36.8 C) (02/01 1837) Pulse Rate:  [53-119] 62 (02/02 0510) Resp:  [14-26] 19 (02/02 0510) BP: (95-154)/(49-93) 95/49 (02/02 0510) SpO2:  [87 %-100 %] 96 % (02/02 0510) Physical Exam: General: Alert, communicative, engaged Cardiovascular: RRR, III/VI systolic murmur Respiratory: Normal WOB on RA, Lungs CTAB, diminished in bases Abdomen: Obese, non-tender Extremities: RLE with necrotic changes to  second toe and chronic plantar ulcer with necrosis but no drainage. No purulence, redness, tenderness. 2+ pitting edema which is his baseline  Laboratory: Recent Labs  Lab 05/28/21 1846 05/29/21 0421  WBC 6.8 5.0  HGB 8.7* 8.2*  HCT 29.5* 27.6*  PLT 229 200   Recent Labs  Lab 05/28/21 1846  05/29/21 0500  NA 138 138  K 5.3* 4.1  CL 102 100  CO2 28 29  BUN 30* 29*  CREATININE 1.43* 1.24  CALCIUM 8.5* 8.2*  PROT 6.0* 5.8*  BILITOT 0.8 0.6  ALKPHOS 98 95  ALT 27 25  AST 23 18  GLUCOSE 169* 127*    Imaging/Diagnostic Tests: DG Chest Portable 1 View CLINICAL DATA:  Cough, short of breath  EXAM: PORTABLE CHEST 1 VIEW  COMPARISON:  05/06/2021  FINDINGS: Single frontal view of the chest demonstrates stable enlargement of the cardiac silhouette. There is persistent central vascular congestion, with interval increase in bibasilar consolidation as well as enlarging bilateral pleural effusions. The left pleural effusion is at least partially loculated. No pneumothorax. No acute bony abnormalities.  IMPRESSION: 1. Progressive bibasilar consolidation and effusions, left greater than right, with persistent central vascular congestion. While the findings likely reflects worsening congestive heart failure, underlying infection cannot be excluded.  Electronically Signed   By: Randa Ngo M.D.   On: 05/28/2021 19:20    Eppie Gibson, MD 05/29/2021, 9:15 AM PGY-1, [place] Intern pager: [phone], text pages welcome

## 2021-05-29 NOTE — Progress Notes (Addendum)
FPTS Interim Progress Note  S: Patient feels like his breathing has been doing well with the scheduled breathing treatments.  Denies any acute issues currently after wanting ice water.  O: BP (!) 132/58 (BP Location: Right Arm)    Pulse 71    Temp 97.8 F (36.6 C) (Oral)    Resp 18    SpO2 98%    Gen: Resting comfortably in bed, NAD Resp: No increased work of breathing on 3 L O2  A/P: Acute on Chronic Hypoxic Respiratory Failure   COVID+   COPD -Monitor respiratory status -Plan per day team note -Considering pulm consult for left loculated pleural effusion -Labs/orders reviewed and adjusted as needed  Gerrit Heck, MD 05/29/2021, 9:58 PM PGY-1, [place] Medicine Service pager [phone]

## 2021-05-30 LAB — GLUCOSE, CAPILLARY
Glucose-Capillary: 206 mg/dL — ABNORMAL HIGH (ref 70–99)
Glucose-Capillary: 197 mg/dL — ABNORMAL HIGH (ref 70–99)
Glucose-Capillary: 194 mg/dL — ABNORMAL HIGH (ref 70–99)
Glucose-Capillary: 233 mg/dL — ABNORMAL HIGH (ref 70–99)

## 2021-05-30 LAB — COMPREHENSIVE METABOLIC PANEL
ALT: 20 U/L (ref 0–44)
AST: 13 U/L — ABNORMAL LOW (ref 15–41)
Albumin: 2.3 g/dL — ABNORMAL LOW (ref 3.5–5.0)
Alkaline Phosphatase: 79 U/L (ref 38–126)
Anion gap: 8 (ref 5–15)
BUN: 33 mg/dL — ABNORMAL HIGH (ref 8–23)
CO2: 28 mmol/L (ref 22–32)
Calcium: 8.2 mg/dL — ABNORMAL LOW (ref 8.9–10.3)
Chloride: 99 mmol/L (ref 98–111)
Creatinine, Ser: 1.35 mg/dL — ABNORMAL HIGH (ref 0.61–1.24)
GFR, Estimated: 57 mL/min — ABNORMAL LOW (ref >60)
Glucose, Bld: 191 mg/dL — ABNORMAL HIGH (ref 70–99)
Potassium: 4.3 mmol/L (ref 3.5–5.1)
Sodium: 135 mmol/L (ref 135–145)
Total Bilirubin: 0.7 mg/dL (ref 0.3–1.2)
Total Protein: 5.3 g/dL — ABNORMAL LOW (ref 6.5–8.1)

## 2021-05-30 LAB — CBC
HCT: 25.1 % — ABNORMAL LOW (ref 39.0–52.0)
HCT: 26 % — ABNORMAL LOW (ref 39.0–52.0)
Hemoglobin: 7.7 g/dL — ABNORMAL LOW (ref 13.0–17.0)
Hemoglobin: 8 g/dL — ABNORMAL LOW (ref 13.0–17.0)
MCH: 26 pg (ref 26.0–34.0)
MCH: 25.8 pg — ABNORMAL LOW (ref 26.0–34.0)
MCHC: 30.7 g/dL (ref 30.0–36.0)
MCHC: 30.8 g/dL (ref 30.0–36.0)
MCV: 84.8 fL (ref 80.0–100.0)
MCV: 83.9 fL (ref 80.0–100.0)
Platelets: 219 10*3/uL (ref 150–400)
Platelets: 221 10*3/uL (ref 150–400)
RBC: 2.96 MIL/uL — ABNORMAL LOW (ref 4.22–5.81)
RBC: 3.1 MIL/uL — ABNORMAL LOW (ref 4.22–5.81)
RDW: 19.4 % — ABNORMAL HIGH (ref 11.5–15.5)
RDW: 19.6 % — ABNORMAL HIGH (ref 11.5–15.5)
WBC: 5.7 10*3/uL (ref 4.0–10.5)
WBC: 7 10*3/uL (ref 4.0–10.5)
nRBC: 0 % (ref 0.0–0.2)
nRBC: 0 % (ref 0.0–0.2)

## 2021-05-30 NOTE — Hospital Course (Addendum)
Brief Hospital Course  COVID-19 Infection Patient presented to the [facility] on 05/28/21 with acute onset dyspnea and cough. On arrival he was afebrile but was tachypnic and hypoxic, requiring 3L/min supplemental O2, up from his home requirement of 2.5L/min. His CXR showed progressive bibasilar consolidation and effusions L>R. Respiratory viral testing was positive for COVID. BNP was 594, which is around his baseline. Lactic acid was negative and he did not have an elevated white blood cell count. He was started on remdesivir and dexamethasone therapies for his COVID. He responded rapidly to respiratory support and steroids and was able to wean to his home O2 of 2.5L by 2/3. He began developing myalgias and fatigue and so remained in hospital until 2/5 at which time he was discharged at his baseline O2 requirement of 2.5L. Of note, his respiratory status improved rather quickly, so we elected not to perform a thoracentesis to drain the likely loculated pleural effusion that was identified on imaging.   Acute on Chronic Anemia Patient has a baseline anemia of 8.5-9. Prior to admission, patient reported ongoing dark, tarry stools.  He was previously evaluated by GI at his last admission a few weeks prior and they determined at that time that the risks of endoscopy/anesthesia outweighed potential benefits.  We held his oral iron supplements on admission due to concerns that they may be contributing to his stool changes.  On 2/4 his hemoglobin was 7.7 so he was transfused one unit of packed red blood cells. He tolerated this well and his hemoglobin was stable at 9.1 on day of discharge.   PVD   Necrotic Changes of R Foot Patient has a long history of known PVD and is s/p L BKA and R great toe amputation. Last time he was admitted, he was noted to have necrotic changes to his L second toe and was offered amputation but deferred. We did not consult vascular surgery during this hospitalization and his symptoms and  physical exam were unchanged from previous.   Items for PCP Follow-Up 1) recommend repeat CBC at follow-up, will need outpatient GI follow-up for anemia 2) Monitor patient's R foot for signs of infection. He has known ischemic changes that will likely require amputation. His next visit with [facility] is 2/14 3) Monitor patient's respiratory status and ensure that he is back at his baseline. If respiratory status is not at baseline, consider repeat imaging to determine whether he will need therapeutic thoracentesis of loculated effusion.  4) Patient is currently using Trelegy samples for COPD control. Discuss options with him for long-term, affordable controller meds. Our pharmacy team may be able to help.

## 2021-05-30 NOTE — Progress Notes (Signed)
Family Medicine Teaching Service Daily Progress Note Intern Pager: [phone]  Patient name: Rodney [name] Medical record number: [id number] Date of birth: 1950/12/23 Age: 71 y.o. Gender: male  Primary Care Provider: [name], MD Consultants: None Code Status: Full  Pt Overview and Major Events to Date:  2/1- admitted  Assessment and Plan: Rodney [name] is a 71 y.o. male presenting with shortness of breath and productive cough found to be COVID+. PMH is significant for HFpEF, hypertension, COPD, hyperlipidemia, tobacco use, T2DM, protein calorie malnutrition, PVD, aortic stenosis, PAF, s/p left BKA, CAD, CKD stage IIIa.  Acute on Chronic Hypoxic Respiratory Failure   COVID +   COPD VSS stable on home O2 of 2.5L/min. Repeat CXR yesterday with interval increase in loculated L pleural effusion with increased atelectasis or consolidation, also with diffuse interstitial opacity throughout R lung.  Remains without an elevated WBC. Symptoms of muscle aches and malaise worse today compared to previous.  - Remdesivir (2/5) - Decadron (2/5) - Duonebs q4h - Continue home Breo + Incruse - Given that symptoms are worse today (day 3 of illness) will plan to observe one more day - Given he is on his home O2, do not believe thoracentesis is warranted at this time  Dark Stools  Normocytic Anemia Hgb 8.7>8.2>7.7>8.0. His transfusion threshold is <8.0. Currently on BID Protonix after GI consult last admission during which it was determined that risks of endoscopy/anesthesia would outweight benefits given his significant cardiac comorbidities. However, he now has dark tarry stools as well.  - Recheck AM CBC, if Hgb <8.0, will transfuse PRBC - Consider consult to GI if Hgb drops further - Protonix 40mg  PO BID  R Foot wounds   PVD   S/p SFA stenting Stable, chronic. Discussed with wound care, they defer debridement of eschar due to high risk for ischemia/infection. If we would like to  pursue debridement, this would be best handled by [facility]. - Continue Plavix and statin  HFpEF   Severe AS -2.8L from admission. Now back on home oxygen, RLE edema at baseline. - Continue home Lasix 40mg  PO daily and Coreg 6.25mg  BID  Paroxysmal A Fib, chronic, stbale In sinus rhythm, rate controlled - Cardiac monitoring - Continue home amiodarone 200mg  daily  T2DM, chronic, stable Glucose 187-206 overnight. - Semglee 15u daily - sSSI, CBG - Gabapentin 600mg  QHS ffor neuropathy  Insomnia - Trazodone PRN for sleep   FEN/GI: Heart healthy, carb modified PPx: Lovenox Dispo:Home tomorrow. Barriers include clinical improvement.   Subjective:  Rodney [name] reports worsening myalgias and generalized malaise today. His dyspnea is improved compared to admission, but feels generally worse today compared to yesterday. He denies fever, chills, CP.   Objective: Temp:  [97.5 F (36.4 C)-97.8 F (36.6 C)] 97.6 F (36.4 C) (02/03 1200) Pulse Rate:  [62-83] 62 (02/03 1200) Resp:  [17-23] 19 (02/03 1200) BP: (107-156)/(58-122) 124/60 (02/03 1200) SpO2:  [95 %-100 %] 99 % (02/03 1200) Weight:  [244 kg] 106 kg (02/02 2233) Physical Exam: General: Awake, alert, sitting up on side of bed eating breakfast Cardiovascular: RRR, III/VI systolic murmur Respiratory: Normal WOB on RA, Lungs CTAB, remain diminished in bases Abdomen: Obese, non-tender, non-distended Extremities: RLE with necrotic changes stable compared to my previous exam. No purulence, erythema, or drainage. 2+ pitting edema at baseline.   Laboratory: Recent Labs  Lab 05/29/21 0421 05/30/21 0118 05/30/21 0725  WBC 5.0 5.7 7.0  HGB 8.2* 7.7* 8.0*  HCT 27.6* 25.1* 26.0*  PLT 200 219  IXL  Lab 05/28/21 1846 05/29/21 0500 05/30/21 0118  NA 138 138 135  K 5.3* 4.1 4.3  CL 102 100 99  CO2 28 29 28   BUN 30* 29* 33*  CREATININE 1.43* 1.24 1.35*  CALCIUM 8.5* 8.2* 8.2*  PROT 6.0* 5.8* 5.3*  BILITOT 0.8 0.6 0.7   ALKPHOS 98 95 79  ALT 27 25 20   AST 23 18 13*  GLUCOSE 169* 127* 191*    Imaging/Diagnostic Tests: DG Chest 2 View CLINICAL DATA:  Respiratory distress, shortness of breath 3 months, COVID positive  EXAM: CHEST - 2 VIEW  COMPARISON:  05/28/2021  FINDINGS: Interval increase in a loculated left pleural effusion, with increased atelectasis or consolidation of the left lung. Mild, diffuse interstitial opacity throughout the aerated right lung. Gross cardiomegaly.  IMPRESSION: 1. Interval increase in a loculated left pleural effusion, with increased atelectasis or consolidation of the left lung.  2. Diffuse interstitial opacity throughout the aerated right lung, most consistent with edema.  3.  Cardiomegaly.  Electronically Signed   By: Delanna Ahmadi M.D.   On: 05/29/2021 11:13    Eppie Gibson, MD 05/30/2021, 12:03 PM PGY-1, [place] Intern pager: [phone], text pages welcome

## 2021-05-31 DIAGNOSIS — D649 Anemia, unspecified: Secondary | ICD-10-CM

## 2021-05-31 LAB — CBC
HCT: 26 % — ABNORMAL LOW (ref 39.0–52.0)
Hemoglobin: 7.7 g/dL — ABNORMAL LOW (ref 13.0–17.0)
MCH: 25.4 pg — ABNORMAL LOW (ref 26.0–34.0)
MCHC: 29.6 g/dL — ABNORMAL LOW (ref 30.0–36.0)
MCV: 85.8 fL (ref 80.0–100.0)
Platelets: 236 10*3/uL (ref 150–400)
RBC: 3.03 MIL/uL — ABNORMAL LOW (ref 4.22–5.81)
RDW: 19.4 % — ABNORMAL HIGH (ref 11.5–15.5)
WBC: 8.2 10*3/uL (ref 4.0–10.5)
nRBC: 0 % (ref 0.0–0.2)

## 2021-05-31 LAB — COMPREHENSIVE METABOLIC PANEL
ALT: 20 U/L (ref 0–44)
AST: 16 U/L (ref 15–41)
Albumin: 2.5 g/dL — ABNORMAL LOW (ref 3.5–5.0)
Alkaline Phosphatase: 86 U/L (ref 38–126)
Anion gap: 7 (ref 5–15)
BUN: 38 mg/dL — ABNORMAL HIGH (ref 8–23)
CO2: 31 mmol/L (ref 22–32)
Calcium: 8.5 mg/dL — ABNORMAL LOW (ref 8.9–10.3)
Chloride: 98 mmol/L (ref 98–111)
Creatinine, Ser: 1.36 mg/dL — ABNORMAL HIGH (ref 0.61–1.24)
GFR, Estimated: 56 mL/min — ABNORMAL LOW (ref >60)
Glucose, Bld: 215 mg/dL — ABNORMAL HIGH (ref 70–99)
Potassium: 4.9 mmol/L (ref 3.5–5.1)
Sodium: 136 mmol/L (ref 135–145)
Total Bilirubin: 0.6 mg/dL (ref 0.3–1.2)
Total Protein: 5.8 g/dL — ABNORMAL LOW (ref 6.5–8.1)

## 2021-05-31 LAB — PREPARE RBC (CROSSMATCH)

## 2021-05-31 LAB — GLUCOSE, CAPILLARY
Glucose-Capillary: 208 mg/dL — ABNORMAL HIGH (ref 70–99)
Glucose-Capillary: 245 mg/dL — ABNORMAL HIGH (ref 70–99)
Glucose-Capillary: 213 mg/dL — ABNORMAL HIGH (ref 70–99)
Glucose-Capillary: 260 mg/dL — ABNORMAL HIGH (ref 70–99)

## 2021-05-31 LAB — HEMOGLOBIN AND HEMATOCRIT, BLOOD
HCT: 29.4 % — ABNORMAL LOW (ref 39.0–52.0)
Hemoglobin: 9 g/dL — ABNORMAL LOW (ref 13.0–17.0)

## 2021-05-31 MED ORDER — SALINE SPRAY 0.65 % NA SOLN
1.0000 | NASAL | Status: DC | PRN
  Administered 2021-06-01: 1 via NASAL
  Filled 2021-05-31: qty 44

## 2021-05-31 MED ORDER — FUROSEMIDE 10 MG/ML IJ SOLN
20.0000 mg | Freq: Once | INTRAMUSCULAR | Status: AC
  Administered 2021-05-31: 20 mg via INTRAVENOUS
  Filled 2021-05-31: qty 2

## 2021-05-31 MED ORDER — SODIUM CHLORIDE 0.9% IV SOLUTION: Freq: Once | INTRAVENOUS | Status: AC

## 2021-05-31 NOTE — Progress Notes (Signed)
FPTS Brief Progress Note  S: Patient resting in bed, did not awaken   O: BP (!) 142/78 (BP Location: Right Arm)    Pulse 67    Temp 97.9 F (36.6 C) (Oral)    Resp 18    Ht 6' (1.829 m)    Wt 106 kg    SpO2 100%    BMI 31.69 kg/m   GEN: Resting in bed comfortably  A/P: Plan per day team note - Orders reviewed. Labs for AM ordered, which was adjusted as needed.   Gerrit Heck, MD 05/31/2021, 1:27 AM PGY-1, [facility] Family Medicine Night Resident  Please page [phone] with questions.

## 2021-05-31 NOTE — Plan of Care (Signed)
  Problem: Education: Goal: Knowledge of risk factors and measures for prevention of condition will improve Outcome: Progressing   Problem: Coping: Goal: Psychosocial and spiritual needs will be supported Outcome: Progressing   Problem: Respiratory: Goal: Will maintain a patent airway Outcome: Progressing Goal: Complications related to the disease process, condition or treatment will be avoided or minimized Outcome: Progressing   

## 2021-05-31 NOTE — Progress Notes (Signed)
Family Medicine Teaching Service Daily Progress Note Intern Pager: [phone]  Patient name: Rodney [name] Medical record number: [id number] Date of birth: [birth year] Age: 71 y.o. Gender: male  Primary Care Provider: [name], MD Consultants: none   Code Status: Full Code  Pt Overview and Major Events to Date:  2/1- admitted  Assessment and Plan: Rodney [name] is a 71 y.o. male who presented with acute on chronic hypoxemic respiratory failure found to be COVID-positive. PMHx significant for: HFpEF, hypertension, COPD, hyperlipidemia, tobacco use, T2DM, protein calorie malnutrition, PVD, aortic stenosis, PAF, s/p left BKA, CAD, CKD stage IIIa.  Acute on chronic hypoxemic respiratory failure COVID-19 infection COPD Appears to be improving, he is close to his home oxygen requirement of 2.5 L, currently on 3 L. - remdesivir day 3/3 - dexamethasone day 3/10 - Duonebs q4h - continue home LAMA/ICS and LAMA inhalers  Normocytic anemia Dark stools Hemoglobin 7.7 this morning below transfusion threshold of 8 for CAD.  Poor candidate for EGD/anesthesia given cardiac comorbidities but consider GI consult if hemoglobin rapidly dropping. - transfuse 1u pRBC - f/u posttransfusion H/H - PPI BID  HFpEF Severe AAS Right lower extremity edema at baseline. - continue furosemide 40 mg daily - continue carvedilol 6.25 mg BID  Right foot wound PAD s/p SFA stenting Foot wound on right second toe is likely secondary to underlying PAD which has been stable.  Previously discussed with wound care, hesitant to debride eschar due to high risk of ischemia/infection but could consider involving [facility] if wanting to pursue debridement. - continue clopidogrel, statin  PAF Chronic, stable.  In sinus rhythm, rate controlled. - continue amiodarone 200 mg daily - cardiac monitoring  T2DM with polyneuropathy Chronic and stable.  Adequate control. - Semglee 15 units daily - sSSI - monitor  CBG - continue gabapentin qhs  Insomnia - trazodone prn  FEN/GI: Heart healthy-carb modified PPx: Lovenox and weight heparin  Disposition: Possible discharge later today or tomorrow  Subjective:  He is still having myalgias and generalized malaise as well as congestion.  He is still having some dyspnea which is worse than his baseline.  He will be hesitant to go home today because he is afraid of spreading COVID infection to his girlfriend who he lives with at home and who is highly involved in his care.  Objective: Temp:  [97.1 F (36.2 C)-98.1 F (36.7 C)] 97.1 F (36.2 C) (02/04 0800) Pulse Rate:  [61-68] 62 (02/04 0800) Resp:  [16-21] 19 (02/04 0800) BP: (119-150)/(60-78) 122/70 (02/04 0800) SpO2:  [98 %-100 %] 100 % (02/04 0819) Weight:  [109 kg] 109 kg (02/04 0500) Physical Exam: General: Alert, resting in bed comfortably, NAD Cardiovascular: RRR, 3/6 systolic murmur Respiratory: Faint bibasilar rales, breathing comfortably on nasal cannula Abdomen: Soft, nondistended Extremities: Warm and well perfused, right lower extremity 2+ pitting edema, right second toe necrotic which is not significantly changed from prior  Laboratory: Recent Labs  Lab 05/30/21 0118 05/30/21 0725 05/31/21 0059  WBC 5.7 7.0 8.2  HGB 7.7* 8.0* 7.7*  HCT 25.1* 26.0* 26.0*  PLT 219 221 236   Recent Labs  Lab 05/29/21 0500 05/30/21 0118 05/31/21 0059  NA 138 135 136  K 4.1 4.3 4.9  CL 100 99 98  CO2 29 28 31   BUN 29* 33* 38*  CREATININE 1.24 1.35* 1.36*  CALCIUM 8.2* 8.2* 8.5*  PROT 5.8* 5.3* 5.8*  BILITOT 0.6 0.7 0.6  ALKPHOS 95 79 86  ALT 25 20 20  AST 18 13* 16  GLUCOSE 127* 191* 215*      Imaging/Diagnostic Tests: No results found.   Zola Button, MD 05/31/2021, 9:33 AM PGY-2, [place] Intern pager: [phone], text pages welcome

## 2021-06-01 DIAGNOSIS — J449 Chronic obstructive pulmonary disease, unspecified: Secondary | ICD-10-CM

## 2021-06-01 LAB — COMPREHENSIVE METABOLIC PANEL
ALT: 23 U/L (ref 0–44)
AST: 19 U/L (ref 15–41)
Albumin: 2.6 g/dL — ABNORMAL LOW (ref 3.5–5.0)
Alkaline Phosphatase: 81 U/L (ref 38–126)
Anion gap: 7 (ref 5–15)
BUN: 44 mg/dL — ABNORMAL HIGH (ref 8–23)
CO2: 32 mmol/L (ref 22–32)
Calcium: 8.4 mg/dL — ABNORMAL LOW (ref 8.9–10.3)
Chloride: 95 mmol/L — ABNORMAL LOW (ref 98–111)
Creatinine, Ser: 1.31 mg/dL — ABNORMAL HIGH (ref 0.61–1.24)
GFR, Estimated: 59 mL/min — ABNORMAL LOW (ref >60)
Glucose, Bld: 241 mg/dL — ABNORMAL HIGH (ref 70–99)
Potassium: 4.8 mmol/L (ref 3.5–5.1)
Sodium: 134 mmol/L — ABNORMAL LOW (ref 135–145)
Total Bilirubin: 0.9 mg/dL (ref 0.3–1.2)
Total Protein: 5.7 g/dL — ABNORMAL LOW (ref 6.5–8.1)

## 2021-06-01 LAB — GLUCOSE, CAPILLARY
Glucose-Capillary: 237 mg/dL — ABNORMAL HIGH (ref 70–99)
Glucose-Capillary: 274 mg/dL — ABNORMAL HIGH (ref 70–99)
Glucose-Capillary: 223 mg/dL — ABNORMAL HIGH (ref 70–99)

## 2021-06-01 LAB — TYPE AND SCREEN
ABO/RH(D): A POS
Antibody Screen: NEGATIVE
Unit division: 0

## 2021-06-01 LAB — BPAM RBC
Blood Product Expiration Date: 202302232359
ISSUE DATE / TIME: 202302041056
Unit Type and Rh: 6200

## 2021-06-01 LAB — CBC
HCT: 29.6 % — ABNORMAL LOW (ref 39.0–52.0)
Hemoglobin: 9.1 g/dL — ABNORMAL LOW (ref 13.0–17.0)
MCH: 26.4 pg (ref 26.0–34.0)
MCHC: 30.7 g/dL (ref 30.0–36.0)
MCV: 85.8 fL (ref 80.0–100.0)
Platelets: 223 10*3/uL (ref 150–400)
RBC: 3.45 MIL/uL — ABNORMAL LOW (ref 4.22–5.81)
RDW: 18.8 % — ABNORMAL HIGH (ref 11.5–15.5)
WBC: 8.7 10*3/uL (ref 4.0–10.5)
nRBC: 0 % (ref 0.0–0.2)

## 2021-06-01 MED ORDER — TRELEGY ELLIPTA 100-62.5-25 MCG/ACT IN AEPB: 1.0000 | INHALATION_SPRAY | Freq: Every day | RESPIRATORY_TRACT | Status: DC

## 2021-06-01 MED ORDER — DEXAMETHASONE 6 MG PO TABS: 6.0000 mg | ORAL_TABLET | ORAL | 0 refills | Status: DC

## 2021-06-01 NOTE — Discharge Instructions (Addendum)
Dear Rodney [name],  Thank you for letting us participate in your care. You were hospitalized for shortness of breath and diagnosed with Pneumonia due to COVID-19 virus. You were treated with steroids and an antiviral medication called remdesivir.   POST-HOSPITAL & CARE INSTRUCTIONS You should continue to take your steroid (dexamethasone) for a total of 10 days. Your last dose should be on 2/11.  We will recheck your hemoglobin at your follow-up visit in our office. We will help get you set up to meet with a gastroenterologist on an outpatient basis.  Be sure to follow-up with vascular surgery for your toe. You have an appointment with them on 2/14.  Go to your follow up appointments (listed below)   DOCTOR'S APPOINTMENT   Future Appointments  Date Time Provider [place]  06/02/2021  1:30 PM [facility] MANAGER [facility] [place]  06/06/2021  2:30 PM Lattie Haw, MD [facility]  06/10/2021  3:30 PM [facility]  07/11/2021  8:40 AM O'Neal, Cassie Freer, MD [facility]     Take care and be well!  [facility]  [place], [place] [address] [phone]

## 2021-06-01 NOTE — Progress Notes (Signed)
FPTS Brief Progress Note  S:Patient awake when I came into the room. He endorses feeling ok, denied any complaints.    O: BP (!) 154/75 (BP Location: Right Arm)    Pulse 64    Temp 97.6 F (36.4 C) (Oral)    Resp 18    Ht 6' (1.829 m)    Wt 109 kg    SpO2 97%    BMI 32.59 kg/m   General: alert, NAD Resp: Normal WOB, satting 99% on 3L [place]   A/P:  Acute on chronic hypoxemic respiratory failure COVID-19 infection COPD O2 requirement 3L, close to home 2.5L. Completed Remdesivir, currently on day 4/10 Dexamethasone  - Duonebs q4h - continue home LAMA/ICS and LAMA inhalers  Remainder of plan per day progress note   - Orders reviewed. Labs for AM ordered, which was adjusted as needed.    Shary Key, DO 06/01/2021, 12:44 AM PGY-2, [place] Family Medicine Night Resident  Please page [phone] with questions.

## 2021-06-01 NOTE — Progress Notes (Addendum)
Family Medicine Teaching Service Daily Progress Note Intern Pager: [phone]  Patient name: Rodney [name] Medical record number: [id number] Date of birth: Jan 17, 1951 Age: 71 y.o. Gender: male  Primary Care Provider: [name], MD Consultants: None Code Status: Full  Pt Overview and Major Events to Date:  2/1- admitted  Assessment and Plan: CHIJIOKE LASSER is a 71 y.o. male who presented with acute on chronic hypoxemic respiratory failure found to be COVID-positive. PMHx significant for: HFpEF, hypertension, COPD, hyperlipidemia, tobacco use, T2DM, protein calorie malnutrition, PVD, aortic stenosis, PAF, s/p left BKA, CAD, CKD stage IIIa.  Acute on chronic hypoxemic respiratory failure COVID-19 infection COPD Currently on 2.5L/Min which is his home requirement. Improving myalgias and fatigue. Feels generally well and would be amenable to going home. His only concern is passing infection to his girlfriend who is his sole caregiver. He is on day 5 of illness, so could be considered outside of his infectious window given improvement in symptoms and return to baseline.  - Remdesivir day 4/5  - Dexamethasone day 4/10 - Duonebs q4h - Continue home LAMA/ICS/LABA inhalers  Normocytic anemia  Dark stools Received 1u PRBC yesterday, Hgb 7.7>9.0>9.1.  Plan for outpt GI follow-up given poor candidate for endoscopy/anesthesia during acute phase of illness. - Continue to monitor - BID PPI  HFpEF, chronic, stable Severe aortic stenosis Received an extra dose of Lasix yesterday for increased SOB after transfusion, thought to represent possible volume overload. Responded well, put out -1.6L.  - Continue home Lasix 40mg  daily - Continue Coreg 6.25mg  BID  R foot wound PAD s/p SFA stenting R foot necrotic changes, stable from previous exams. - Continue Plavix, statin  Paroxysmal A Fib Chronic, stable. Currently rate and rhythm controlled. - Amiodarone 200mg  daily' - Cardiac  monitoring  T2DM with polyneuropathy Chronic, stable. Glucose 241-260 overnight, up slightly with addition of steroid on admission. Has been getting Semglee 15u daily here, takes 25 at home. - Increase Semglee to home dose of 25u - sSSI, CBGs - Gabapentin QHS for neuropathy  Insomnia - Trazodone QHS PRN  FEN/GI: HH Carb-modified PPx: Lovenox Dispo:Home today. Barriers include weaning O2 to home requirement.   Subjective:  Mr. Enck reports feeling better this morning.  He feels that he could go home today, but he is concerned about the possibility of spreading COVID to his girlfriend.  She is his sole caretaker "if she were to go to the hospital, I would have nobody."  Objective: Temp:  [96.8 F (36 C)-98.6 F (37 C)] 98.6 F (37 C) (02/05 0415) Pulse Rate:  [57-66] 63 (02/05 0415) Resp:  [16-20] 19 (02/05 0415) BP: (122-155)/(64-77) 155/75 (02/05 0415) SpO2:  [96 %-100 %] 98 % (02/05 0415) Weight:  [108.1 kg] 108.1 kg (02/05 0415) Physical Exam: General: Awake, alert, lying in bed watching television Cardiovascular: Regular rate, regular rhythm, III/VI systolic murmur stable from previous exam Respiratory: Normal work of breathing on 2.5 L, slightly diminished lung sounds in left lower lobe,speaking in full sentences Abdomen: Non-tender, non-distended Extremities: RLE edema is at baseline  Laboratory: Recent Labs  Lab 05/30/21 0725 05/31/21 0059 05/31/21 1621 06/01/21 0213  WBC 7.0 8.2  --  8.7  HGB 8.0* 7.7* 9.0* 9.1*  HCT 26.0* 26.0* 29.4* 29.6*  PLT 221 236  --  223   Recent Labs  Lab 05/30/21 0118 05/31/21 0059 06/01/21 0213  NA 135 136 134*  K 4.3 4.9 4.8  CL 99 98 95*  CO2 28 31 32  BUN  33* 38* 44*  CREATININE 1.35* 1.36* 1.31*  CALCIUM 8.2* 8.5* 8.4*  PROT 5.3* 5.8* 5.7*  BILITOT 0.7 0.6 0.9  ALKPHOS 79 86 81  ALT 20 20 23   AST 13* 16 19  GLUCOSE 191* 215* 241*    Imaging/Diagnostic Tests: No new imaging, tests  Eppie Gibson,  MD 06/01/2021, 6:49 AM PGY-1, [facility] Intern pager: [phone], text pages welcome

## 2021-06-01 NOTE — Plan of Care (Signed)
Patient discharged to go home  

## 2021-06-01 NOTE — Discharge Summary (Signed)
[facility] Discharge Summary  Patient name: Rodney [name] Medical record number: [id number] Date of birth: 09-21-1950 Age: 71 y.o. Gender: male Date of Admission: 05/28/2021  Date of Discharge: 06/01/2021 Admitting Physician: Gerrit Heck, MD  Primary Care Provider: [name], MD Consultants: None  Indication for Hospitalization: Shortness of Breath  Discharge Diagnoses/Problem List:  Principal Problem:   Pneumonia due to COVID-19 virus   Disposition: Home  Discharge Condition: Stable  Discharge Exam: From my same day progress note General: Awake, alert, lying in bed watching television Cardiovascular: Regular rate, regular rhythm, III/VI systolic murmur stable from previous exam Respiratory: Normal work of breathing on 2.5 L, slightly diminished lung sounds in left lower lobe,speaking in full sentences Abdomen: Non-tender, non-distended Extremities: RLE edema is at [place] [facility] Course  COVID-19 Infection Patient presented to the [facility] on 05/28/21 with acute onset dyspnea and cough. On arrival he was afebrile but was tachypnic and hypoxic, requiring 3L/min supplemental O2, up from his home requirement of 2.5L/min. His CXR showed progressive bibasilar consolidation and effusions L>R. Respiratory viral testing was positive for COVID. BNP was 594, which is around his baseline. Lactic acid was negative and he did not have an elevated white blood cell count. He was started on remdesivir and dexamethasone therapies for his COVID. He responded rapidly to respiratory support and steroids and was able to wean to his home O2 of 2.5L by 2/3. He began developing myalgias and fatigue and so remained in hospital until 2/5 at which time he was discharged at his baseline O2 requirement of 2.5L. Of note, his respiratory status improved rather quickly, so we elected not to perform a thoracentesis to drain the likely loculated pleural effusion that  was identified on imaging.   Acute on Chronic Anemia Patient has a baseline anemia of 8.5-9. Prior to admission, patient reported ongoing dark, tarry stools.  He was previously evaluated by GI at his last admission a few weeks prior and they determined at that time that the risks of endoscopy/anesthesia outweighed potential benefits.  We held his oral iron supplements on admission due to concerns that they may be contributing to his stool changes.  On 2/4 his hemoglobin was 7.7 so he was transfused one unit of packed red blood cells. He tolerated this well and his hemoglobin was stable at 9.1 on day of discharge.   PVD   Necrotic Changes of R Foot Patient has a long history of known PVD and is s/p L BKA and R great toe amputation. Last time he was admitted, he was noted to have necrotic changes to his L second toe and was offered amputation but deferred. We did not consult vascular surgery during this hospitalization and his symptoms and physical exam were unchanged from previous.   Items for PCP Follow-Up 1) recommend repeat CBC at follow-up, will need outpatient GI follow-up for anemia 2) Monitor patient's R foot for signs of infection. He has known ischemic changes that will likely require amputation. His next visit with [facility] is 2/14 3) Monitor patient's respiratory status and ensure that he is back at his baseline. If respiratory status is not at baseline, consider repeat imaging to determine whether he will need therapeutic thoracentesis of loculated effusion.  4) Patient is currently using Trelegy samples for COPD control. Discuss options with him for long-term, affordable controller meds. Our pharmacy team may be able to help.     Significant Procedures: None  Significant Labs and Imaging:  Recent Labs  Lab 05/30/21 0725 05/31/21 0059 05/31/21 1621 06/01/21 0213  WBC 7.0 8.2  --  8.7  HGB 8.0* 7.7* 9.0* 9.1*  HCT 26.0* 26.0* 29.4* 29.6*  PLT 221 236  --  223   Recent Labs  Lab  05/28/21 1846 05/29/21 0500 05/30/21 0118 05/31/21 0059 06/01/21 0213  NA 138 138 135 136 134*  K 5.3* 4.1 4.3 4.9 4.8  CL 102 100 99 98 95*  CO2 _0 32  GLUCOSE 169* 127* 191* 215* 241*  BUN 30* 29* 33* 38* 44*  CREATININE 1.43* 1.24 1.35* 1.36* 1.31*  CALCIUM 8.5* 8.2* 8.2* 8.5* 8.4*  ALKPHOS 98 95 79 86 81  AST 23 18 13* 16 19  ALT _1 ALBUMIN 2.6* 2.4* 2.3* 2.5* 2.6*   DG Chest 2 View CLINICAL DATA:  Respiratory distress, shortness of breath 3 months, COVID positive  EXAM: CHEST - 2 VIEW  COMPARISON:  05/28/2021  FINDINGS: Interval increase in a loculated left pleural effusion, with increased atelectasis or consolidation of the left lung. Mild, diffuse interstitial opacity throughout the aerated right lung. Gross cardiomegaly.  IMPRESSION: 1. Interval increase in a loculated left pleural effusion, with increased atelectasis or consolidation of the left lung.  2. Diffuse interstitial opacity throughout the aerated right lung, most consistent with edema.  3.  Cardiomegaly.  Electronically Signed   By: Delanna Ahmadi M.D.   On: 05/29/2021 11:13     Results/Tests Pending at Time of Discharge: None  Discharge Medications:  Allergies as of 06/01/2021   No Known Allergies      Medication List     TAKE these medications    acetaminophen 650 MG CR tablet Commonly known as: TYLENOL Take 1,300 mg by mouth in the morning and at bedtime.   albuterol 108 (90 Base) MCG/ACT inhaler Commonly known as: VENTOLIN HFA INHALE 2 PUFFS INTO THE LUNGS EVERY 6 (SIX) HOURS AS NEEDED FOR WHEEZING OR SHORTNESS OF BREATH.   amiodarone 200 MG tablet Commonly known as: PACERONE Take 1 tablet (200 mg total) by mouth daily.   atorvastatin 80 MG tablet Commonly known as: LIPITOR TAKE 1 TABLET EVERY DAY   BD Pen Needle Nano U/F 32G X 4 MM Misc Generic drug: Insulin Pen Needle Use to inject insulin up to 4 times daily as directed,   carvedilol 12.5  MG tablet Commonly known as: COREG Take 6.25 mg by mouth 2 (two) times daily with a meal.   clopidogrel 75 MG tablet Commonly known as: PLAVIX TAKE 1 TABLET EVERY DAY WITH BREAKFAST What changed: See the new instructions.   dexamethasone 6 MG tablet Commonly known as: DECADRON Take 1 tablet (6 mg total) by mouth daily. Start taking on: June 02, 2021   FeroSul 325 (65 FE) MG tablet Generic drug: ferrous sulfate Take 1 tablet (325 mg total) by mouth every other day. What changed: when to take this   fluticasone 50 MCG/ACT nasal spray Commonly known as: FLONASE Place 2 sprays into both nostrils daily.   furosemide 40 MG tablet Commonly known as: LASIX Take 1 tablet (40 mg total) by mouth daily as needed for fluid (swelling). What changed: when to take this   gabapentin 300 MG capsule Commonly known as: NEURONTIN Take 600 mg by mouth at bedtime.   ipratropium-albuterol 0.5-2.5 (3) MG/3ML Soln Commonly known as: DUONEB Take 3 mLs by nebulization every 4 (four) hours as needed.   lactose free nutrition Liqd Take  237 mLs by mouth 3 (three) times a week. What changed: Another medication with the same name was removed. Continue taking this medication, and follow the directions you see here.   Levemir FlexTouch 100 UNIT/ML FlexPen Generic drug: insulin detemir Inject 25 Units into the skin daily.   metFORMIN 1000 MG tablet Commonly known as: GLUCOPHAGE Take 1 tablet (1,000 mg total) by mouth daily.   pantoprazole 40 MG tablet Commonly known as: Protonix Take 1 tablet (40 mg total) by mouth 2 (two) times daily.   polyethylene glycol 17 g packet Commonly known as: MIRALAX / GLYCOLAX Take 17 g by mouth daily as needed for moderate constipation.   ReliOn True Metrix Test Strips test strip Generic drug: glucose blood Use to test blood sugar three times per day.   traZODone 50 MG tablet Commonly known as: DESYREL Take 0.5-1 tablets (25-50 mg total) by mouth at bedtime  as needed for sleep.   Trelegy Ellipta 100-62.5-25 MCG/ACT Aepb Generic drug: Fluticasone-Umeclidin-Vilant Inhale 1 puff into the lungs daily.   True Metrix Meter Devi Use to test blood sugar three times daily.   True Metrix Meter w/Device Kit USE AS DIRECTED   TRUEplus Lancets 33G Misc Use to test blood sugar three times per day.        Discharge Instructions: Please refer to Patient Instructions section of EMR for full details.  Patient was counseled important signs and symptoms that should prompt return to medical care, changes in medications, dietary instructions, activity restrictions, and follow up appointments.   Follow-Up Appointments: 06/05/2021 11:10am Madison Hickman, MD [facility] 06/10/2021 3:30pm [facility] Eulis Canner, Dorene Grebe, MD 06/01/2021, 2:52 PM PGY-1, [place]

## 2021-06-02 ENCOUNTER — Telehealth: Payer: Self-pay

## 2021-06-02 ENCOUNTER — Telehealth: Payer: Medicare PPO

## 2021-06-02 LAB — CULTURE, BLOOD (ROUTINE X 2)
Culture: NO GROWTH
Special Requests: ADEQUATE

## 2021-06-02 NOTE — Telephone Encounter (Signed)
° °  RN Case Manager Care Management   Phone Outreach    06/02/2021 Name: Rodney [name] MRN: [id number] DOB: 20-Apr-1951  [name] is a 71 y.o. year old male who is a primary care patient of Hensel, Jamal Collin, MD .   Telephone outreach was unsuccessful A HIPPA compliant phone message was left for the patient providing contact information and requesting a return call.   Follow Up Plan: Will route chart to Care Guide to see if patient would like to reschedule phone appointment.    Review of patient status, including review of consultants reports, relevant laboratory and other test results, and collaboration with appropriate care team members and the patient's provider was performed as part of comprehensive patient evaluation and provision of care management services.    Lazaro Arms RN, BSN, [facility] Care Management Coordinator [place] Phone: [phone] Fax: [phone]

## 2021-06-03 ENCOUNTER — Telehealth: Payer: Self-pay | Admitting: *Deleted

## 2021-06-03 NOTE — Chronic Care Management (AMB) (Signed)
°  Care Management   Note  06/03/2021 Name: Rodney [name] MRN: [id number] DOB: Oct 16, 1950  Rodney [name] is a 71 y.o. year old male who is a primary care patient of Hensel, Jamal Collin, MD and is actively engaged with the care management team. I reached out to Rodney [name] by phone today to assist with re-scheduling an initial visit with the RN Case Manager  Follow up plan: Unsuccessful telephone outreach attempt made. A HIPAA compliant phone message was left for the patient providing contact information and requesting a return call.  The care management team will reach out to the patient again over the next 7 days.  If patient returns call to provider office, please advise to call [place] at [phone].  Linda Management  Direct Dial: [phone]

## 2021-06-05 ENCOUNTER — Inpatient Hospital Stay: Payer: Medicare PPO | Admitting: Family Medicine

## 2021-06-06 ENCOUNTER — Inpatient Hospital Stay: Payer: Medicare PPO | Admitting: Family Medicine

## 2021-06-09 ENCOUNTER — Inpatient Hospital Stay: Payer: Medicare PPO | Admitting: Family Medicine

## 2021-06-10 NOTE — Chronic Care Management (AMB) (Signed)
°  Care Management   Note  06/10/2021 Name: JAVEL HERSH MRN: [id number] DOB: 05-09-1950  [name] is a 71 y.o. year old male who is a primary care patient of Hensel, Jamal Collin, MD and is actively engaged with the care management team. I reached out to [name] by phone today to assist with re-scheduling an initial visit with the RN Case Manager  Follow up plan: Telephone appointment with care management team member scheduled for:06/16/21  Laurie Management  Direct Dial: [phone]

## 2021-06-11 ENCOUNTER — Inpatient Hospital Stay: Payer: Medicare PPO | Admitting: Family Medicine

## 2021-06-11 ENCOUNTER — Telehealth (INDEPENDENT_AMBULATORY_CARE_PROVIDER_SITE_OTHER): Payer: Medicare PPO | Admitting: Family Medicine

## 2021-06-11 ENCOUNTER — Telehealth: Payer: Self-pay | Admitting: Family Medicine

## 2021-06-11 NOTE — Telephone Encounter (Signed)
Wife called to say likely cannot make appointment today due to loose stools and fecal incontinence (due to urgency.)  Has had recent antibiotics but not on any now.  No fever.  No blood in stools.  No sick contacts.  Otherwise feels OK.  Has not checked blood sugars.  After reviewing meds, I asked to check blood sugar.  If reasonable control, stop metformin for now, since likely contributing to loose stools.  Reschedule for next week.

## 2021-06-12 NOTE — Progress Notes (Signed)
Encounter created in error

## 2021-06-16 ENCOUNTER — Telehealth: Payer: Self-pay

## 2021-06-16 ENCOUNTER — Telehealth: Payer: Self-pay | Admitting: Family Medicine

## 2021-06-16 ENCOUNTER — Telehealth: Payer: Medicare PPO

## 2021-06-16 NOTE — Telephone Encounter (Signed)
° °  RN Case Manager Care Management   Phone Outreach    06/16/2021 Name: Rodney [name] MRN: [id number] DOB: 10/16/1950  [name] is a 71 y.o. year old male who is a primary care patient of Hensel, Jamal Collin, MD .   2nd unsuccessful telephone outreach attempt.  If unable to reach patient by phone on the 3rd attempt, will discontinue outreach calls but will be available at any time to provide services.   Follow Up Plan: Will route chart to Care Guide to see if patient would like to reschedule phone appointment.    Review of patient status, including review of consultants reports, relevant laboratory and other test results, and collaboration with appropriate care team members and the patient's provider was performed as part of comprehensive patient evaluation and provision of care management services.    Lazaro Arms RN, BSN, [facility] Care Management Coordinator [place] Phone: [phone] Fax: [phone]

## 2021-06-16 NOTE — Telephone Encounter (Signed)
Called and spoke to [name].  The problem is twofold Diarrhea, waxes and wanes.  No blood, abd pain or systemic symptoms.  Has had recent antibiotic therapy.  Will try immodium.  If persists or worsens, will need stool pathogen testing.  He is at risk for C diff. Weak.  Cannot stand on own.  Not being seen by PT - they stopped with concern for foot cellulitis.  [facility] is of the opinion that this is all a circulation problem.  Although, they have not made any appointments to me or [facility] due to weakness and diarrhea.  Clearly has a severe deconditioning problem. If this does not true around quickly, he will end up back in the hospital and from there will need SNF for rehab.    Rodney [name] will try immodium and will call PT herself.

## 2021-06-16 NOTE — Telephone Encounter (Signed)
[name] is calling and would like for Dr. Andria Frames to call her concerning patient. She is still having issues with getting patient in and out of the bed and he is still having irregular bm's.  The best call back number is [phone]

## 2021-06-18 ENCOUNTER — Inpatient Hospital Stay: Payer: Medicare PPO | Admitting: Family Medicine

## 2021-06-19 ENCOUNTER — Other Ambulatory Visit: Payer: Self-pay

## 2021-06-19 ENCOUNTER — Inpatient Hospital Stay (HOSPITAL_COMMUNITY)
Admission: EM | Admit: 2021-06-19 | Discharge: 2021-06-30 | DRG: 371 | Disposition: A | Discharge status: Skilled Nursing Facility | Payer: Medicare PPO | Attending: Family Medicine | Admitting: Family Medicine

## 2021-06-19 ENCOUNTER — Encounter (HOSPITAL_COMMUNITY): Payer: Self-pay

## 2021-06-19 DIAGNOSIS — D631 Anemia in chronic kidney disease: Secondary | ICD-10-CM | POA: Diagnosis present

## 2021-06-19 DIAGNOSIS — I700 Atherosclerosis of aorta: Secondary | ICD-10-CM | POA: Diagnosis not present

## 2021-06-19 DIAGNOSIS — J9611 Chronic respiratory failure with hypoxia: Secondary | ICD-10-CM | POA: Diagnosis present

## 2021-06-19 DIAGNOSIS — Z609 Problem related to social environment, unspecified: Secondary | ICD-10-CM | POA: Diagnosis not present

## 2021-06-19 DIAGNOSIS — I739 Peripheral vascular disease, unspecified: Secondary | ICD-10-CM | POA: Diagnosis present

## 2021-06-19 DIAGNOSIS — D638 Anemia in other chronic diseases classified elsewhere: Secondary | ICD-10-CM | POA: Diagnosis not present

## 2021-06-19 DIAGNOSIS — J810 Acute pulmonary edema: Secondary | ICD-10-CM | POA: Diagnosis not present

## 2021-06-19 DIAGNOSIS — J90 Pleural effusion, not elsewhere classified: Secondary | ICD-10-CM | POA: Diagnosis not present

## 2021-06-19 DIAGNOSIS — E860 Dehydration: Secondary | ICD-10-CM | POA: Diagnosis present

## 2021-06-19 DIAGNOSIS — L97519 Non-pressure chronic ulcer of other part of right foot with unspecified severity: Secondary | ICD-10-CM | POA: Diagnosis present

## 2021-06-19 DIAGNOSIS — E1152 Type 2 diabetes mellitus with diabetic peripheral angiopathy with gangrene: Secondary | ICD-10-CM | POA: Diagnosis not present

## 2021-06-19 DIAGNOSIS — R197 Diarrhea, unspecified: Secondary | ICD-10-CM | POA: Diagnosis present

## 2021-06-19 DIAGNOSIS — L03115 Cellulitis of right lower limb: Secondary | ICD-10-CM | POA: Diagnosis present

## 2021-06-19 DIAGNOSIS — R531 Weakness: Secondary | ICD-10-CM | POA: Diagnosis not present

## 2021-06-19 DIAGNOSIS — E46 Unspecified protein-calorie malnutrition: Secondary | ICD-10-CM | POA: Diagnosis present

## 2021-06-19 DIAGNOSIS — E8809 Other disorders of plasma-protein metabolism, not elsewhere classified: Secondary | ICD-10-CM | POA: Diagnosis present

## 2021-06-19 DIAGNOSIS — Z720 Tobacco use: Secondary | ICD-10-CM | POA: Diagnosis not present

## 2021-06-19 DIAGNOSIS — I96 Gangrene, not elsewhere classified: Secondary | ICD-10-CM | POA: Diagnosis present

## 2021-06-19 DIAGNOSIS — J9601 Acute respiratory failure with hypoxia: Secondary | ICD-10-CM | POA: Diagnosis present

## 2021-06-19 DIAGNOSIS — G4700 Insomnia, unspecified: Secondary | ICD-10-CM | POA: Diagnosis present

## 2021-06-19 DIAGNOSIS — L0390 Cellulitis, unspecified: Secondary | ICD-10-CM | POA: Diagnosis not present

## 2021-06-19 DIAGNOSIS — A0472 Enterocolitis due to Clostridium difficile, not specified as recurrent: Principal | ICD-10-CM | POA: Diagnosis present

## 2021-06-19 DIAGNOSIS — I2510 Atherosclerotic heart disease of native coronary artery without angina pectoris: Secondary | ICD-10-CM | POA: Diagnosis present

## 2021-06-19 DIAGNOSIS — N183 Chronic kidney disease, stage 3 unspecified: Secondary | ICD-10-CM | POA: Diagnosis present

## 2021-06-19 DIAGNOSIS — Z79899 Other long term (current) drug therapy: Secondary | ICD-10-CM

## 2021-06-19 DIAGNOSIS — J9621 Acute and chronic respiratory failure with hypoxia: Secondary | ICD-10-CM | POA: Diagnosis not present

## 2021-06-19 DIAGNOSIS — Z7951 Long term (current) use of inhaled steroids: Secondary | ICD-10-CM

## 2021-06-19 DIAGNOSIS — Z794 Long term (current) use of insulin: Secondary | ICD-10-CM

## 2021-06-19 DIAGNOSIS — N189 Chronic kidney disease, unspecified: Secondary | ICD-10-CM | POA: Diagnosis present

## 2021-06-19 DIAGNOSIS — I130 Hypertensive heart and chronic kidney disease with heart failure and stage 1 through stage 4 chronic kidney disease, or unspecified chronic kidney disease: Secondary | ICD-10-CM | POA: Diagnosis present

## 2021-06-19 DIAGNOSIS — M6281 Muscle weakness (generalized): Secondary | ICD-10-CM | POA: Diagnosis not present

## 2021-06-19 DIAGNOSIS — F17210 Nicotine dependence, cigarettes, uncomplicated: Secondary | ICD-10-CM | POA: Diagnosis present

## 2021-06-19 DIAGNOSIS — E1122 Type 2 diabetes mellitus with diabetic chronic kidney disease: Secondary | ICD-10-CM | POA: Diagnosis present

## 2021-06-19 DIAGNOSIS — E861 Hypovolemia: Secondary | ICD-10-CM | POA: Diagnosis present

## 2021-06-19 DIAGNOSIS — I517 Cardiomegaly: Secondary | ICD-10-CM | POA: Diagnosis not present

## 2021-06-19 DIAGNOSIS — E7800 Pure hypercholesterolemia, unspecified: Secondary | ICD-10-CM | POA: Diagnosis present

## 2021-06-19 DIAGNOSIS — E11621 Type 2 diabetes mellitus with foot ulcer: Secondary | ICD-10-CM | POA: Diagnosis present

## 2021-06-19 DIAGNOSIS — J449 Chronic obstructive pulmonary disease, unspecified: Secondary | ICD-10-CM | POA: Diagnosis not present

## 2021-06-19 DIAGNOSIS — Z8616 Personal history of COVID-19: Secondary | ICD-10-CM | POA: Diagnosis not present

## 2021-06-19 DIAGNOSIS — I959 Hypotension, unspecified: Secondary | ICD-10-CM | POA: Diagnosis not present

## 2021-06-19 DIAGNOSIS — I10 Essential (primary) hypertension: Secondary | ICD-10-CM | POA: Diagnosis present

## 2021-06-19 DIAGNOSIS — I5033 Acute on chronic diastolic (congestive) heart failure: Secondary | ICD-10-CM | POA: Diagnosis present

## 2021-06-19 DIAGNOSIS — I080 Rheumatic disorders of both mitral and aortic valves: Secondary | ICD-10-CM | POA: Diagnosis present

## 2021-06-19 DIAGNOSIS — N1831 Chronic kidney disease, stage 3a: Secondary | ICD-10-CM | POA: Diagnosis present

## 2021-06-19 DIAGNOSIS — Z7401 Bed confinement status: Secondary | ICD-10-CM | POA: Diagnosis not present

## 2021-06-19 DIAGNOSIS — L8990 Pressure ulcer of unspecified site, unspecified stage: Secondary | ICD-10-CM | POA: Diagnosis present

## 2021-06-19 DIAGNOSIS — K219 Gastro-esophageal reflux disease without esophagitis: Secondary | ICD-10-CM | POA: Diagnosis present

## 2021-06-19 DIAGNOSIS — I5020 Unspecified systolic (congestive) heart failure: Secondary | ICD-10-CM | POA: Diagnosis not present

## 2021-06-19 DIAGNOSIS — I350 Nonrheumatic aortic (valve) stenosis: Secondary | ICD-10-CM

## 2021-06-19 DIAGNOSIS — R0602 Shortness of breath: Secondary | ICD-10-CM

## 2021-06-19 DIAGNOSIS — J9811 Atelectasis: Secondary | ICD-10-CM | POA: Diagnosis not present

## 2021-06-19 DIAGNOSIS — Z8701 Personal history of pneumonia (recurrent): Secondary | ICD-10-CM | POA: Diagnosis not present

## 2021-06-19 DIAGNOSIS — A46 Erysipelas: Secondary | ICD-10-CM | POA: Diagnosis present

## 2021-06-19 DIAGNOSIS — I480 Paroxysmal atrial fibrillation: Secondary | ICD-10-CM | POA: Diagnosis present

## 2021-06-19 DIAGNOSIS — S91101A Unspecified open wound of right great toe without damage to nail, initial encounter: Secondary | ICD-10-CM | POA: Diagnosis present

## 2021-06-19 DIAGNOSIS — Z89512 Acquired absence of left leg below knee: Secondary | ICD-10-CM

## 2021-06-19 DIAGNOSIS — E1149 Type 2 diabetes mellitus with other diabetic neurological complication: Secondary | ICD-10-CM | POA: Diagnosis not present

## 2021-06-19 DIAGNOSIS — R0902 Hypoxemia: Secondary | ICD-10-CM | POA: Diagnosis not present

## 2021-06-19 DIAGNOSIS — Z20822 Contact with and (suspected) exposure to covid-19: Secondary | ICD-10-CM | POA: Diagnosis present

## 2021-06-19 DIAGNOSIS — Z86718 Personal history of other venous thrombosis and embolism: Secondary | ICD-10-CM

## 2021-06-19 DIAGNOSIS — Z7984 Long term (current) use of oral hypoglycemic drugs: Secondary | ICD-10-CM

## 2021-06-19 DIAGNOSIS — I129 Hypertensive chronic kidney disease with stage 1 through stage 4 chronic kidney disease, or unspecified chronic kidney disease: Secondary | ICD-10-CM | POA: Diagnosis not present

## 2021-06-19 DIAGNOSIS — N179 Acute kidney failure, unspecified: Secondary | ICD-10-CM | POA: Diagnosis not present

## 2021-06-19 DIAGNOSIS — E1142 Type 2 diabetes mellitus with diabetic polyneuropathy: Secondary | ICD-10-CM | POA: Diagnosis present

## 2021-06-19 DIAGNOSIS — E440 Moderate protein-calorie malnutrition: Secondary | ICD-10-CM | POA: Diagnosis not present

## 2021-06-19 DIAGNOSIS — Z6836 Body mass index (BMI) 36.0-36.9, adult: Secondary | ICD-10-CM

## 2021-06-19 DIAGNOSIS — Z7902 Long term (current) use of antithrombotics/antiplatelets: Secondary | ICD-10-CM

## 2021-06-19 DIAGNOSIS — I509 Heart failure, unspecified: Secondary | ICD-10-CM | POA: Diagnosis not present

## 2021-06-19 HISTORY — DX: Acute on chronic diastolic (congestive) heart failure: I50.33

## 2021-06-19 HISTORY — DX: Acute respiratory failure with hypoxia: J96.01

## 2021-06-19 HISTORY — DX: Acute combined systolic (congestive) and diastolic (congestive) heart failure: I50.41

## 2021-06-19 LAB — COMPREHENSIVE METABOLIC PANEL
ALT: 25 U/L (ref 0–44)
AST: 18 U/L (ref 15–41)
Albumin: 2 g/dL — ABNORMAL LOW (ref 3.5–5.0)
Alkaline Phosphatase: 82 U/L (ref 38–126)
Anion gap: 7 (ref 5–15)
BUN: 27 mg/dL — ABNORMAL HIGH (ref 8–23)
CO2: 28 mmol/L (ref 22–32)
Calcium: 7.6 mg/dL — ABNORMAL LOW (ref 8.9–10.3)
Chloride: 98 mmol/L (ref 98–111)
Creatinine, Ser: 1.88 mg/dL — ABNORMAL HIGH (ref 0.61–1.24)
GFR, Estimated: 38 mL/min — ABNORMAL LOW (ref >60)
Glucose, Bld: 106 mg/dL — ABNORMAL HIGH (ref 70–99)
Potassium: 4.4 mmol/L (ref 3.5–5.1)
Sodium: 133 mmol/L — ABNORMAL LOW (ref 135–145)
Total Bilirubin: 0.5 mg/dL (ref 0.3–1.2)
Total Protein: 5.1 g/dL — ABNORMAL LOW (ref 6.5–8.1)

## 2021-06-19 LAB — CBC WITH DIFFERENTIAL/PLATELET
Abs Immature Granulocytes: 0.07 10*3/uL (ref 0.00–0.07)
Basophils Absolute: 0 10*3/uL (ref 0.0–0.1)
Basophils Relative: 0 %
Eosinophils Absolute: 0.2 10*3/uL (ref 0.0–0.5)
Eosinophils Relative: 2 %
HCT: 29.7 % — ABNORMAL LOW (ref 39.0–52.0)
Hemoglobin: 9.2 g/dL — ABNORMAL LOW (ref 13.0–17.0)
Immature Granulocytes: 1 %
Lymphocytes Relative: 10 %
Lymphs Abs: 1.1 10*3/uL (ref 0.7–4.0)
MCH: 27 pg (ref 26.0–34.0)
MCHC: 31 g/dL (ref 30.0–36.0)
MCV: 87.1 fL (ref 80.0–100.0)
Monocytes Absolute: 0.7 10*3/uL (ref 0.1–1.0)
Monocytes Relative: 6 %
Neutro Abs: 9.6 10*3/uL — ABNORMAL HIGH (ref 1.7–7.7)
Neutrophils Relative %: 81 %
Platelets: 269 10*3/uL (ref 150–400)
RBC: 3.41 MIL/uL — ABNORMAL LOW (ref 4.22–5.81)
RDW: 18.9 % — ABNORMAL HIGH (ref 11.5–15.5)
WBC: 11.7 10*3/uL — ABNORMAL HIGH (ref 4.0–10.5)
nRBC: 0 % (ref 0.0–0.2)

## 2021-06-19 LAB — CBG MONITORING, ED: Glucose-Capillary: 119 mg/dL — ABNORMAL HIGH (ref 70–99)

## 2021-06-19 MED ORDER — UMECLIDINIUM BROMIDE 62.5 MCG/ACT IN AEPB
1.0000 | INHALATION_SPRAY | Freq: Every day | RESPIRATORY_TRACT | Status: DC
  Administered 2021-06-20 – 2021-06-23 (×4): 1 via RESPIRATORY_TRACT
  Filled 2021-06-19: qty 7

## 2021-06-19 MED ORDER — NICOTINE 7 MG/24HR TD PT24
7.0000 mg | MEDICATED_PATCH | Freq: Every day | TRANSDERMAL | Status: DC
  Administered 2021-06-19 – 2021-06-30 (×12): 7 mg via TRANSDERMAL
  Filled 2021-06-19 (×12): qty 1

## 2021-06-19 MED ORDER — IPRATROPIUM-ALBUTEROL 0.5-2.5 (3) MG/3ML IN SOLN
3.0000 mL | RESPIRATORY_TRACT | Status: DC | PRN
  Administered 2021-06-20 – 2021-06-23 (×5): 3 mL via RESPIRATORY_TRACT
  Filled 2021-06-19 (×5): qty 3

## 2021-06-19 MED ORDER — FLUTICASONE PROPIONATE 50 MCG/ACT NA SUSP
2.0000 | Freq: Every day | NASAL | Status: DC
  Administered 2021-06-29 – 2021-06-30 (×2): 2 via NASAL
  Filled 2021-06-19: qty 16

## 2021-06-19 MED ORDER — GABAPENTIN 300 MG PO CAPS
600.0000 mg | ORAL_CAPSULE | Freq: Every day | ORAL | Status: DC
  Administered 2021-06-20 – 2021-06-29 (×10): 600 mg via ORAL
  Filled 2021-06-19 (×10): qty 2

## 2021-06-19 MED ORDER — CLOPIDOGREL BISULFATE 75 MG PO TABS
75.0000 mg | ORAL_TABLET | Freq: Every day | ORAL | Status: DC
Start: 2021-06-20 — End: 2021-06-30
  Administered 2021-06-20 – 2021-06-30 (×10): 75 mg via ORAL
  Filled 2021-06-19 (×10): qty 1

## 2021-06-19 MED ORDER — TRAZODONE HCL 50 MG PO TABS
25.0000 mg | ORAL_TABLET | Freq: Every evening | ORAL | Status: DC | PRN
  Administered 2021-06-20 – 2021-06-29 (×6): 50 mg via ORAL
  Filled 2021-06-19 (×7): qty 1

## 2021-06-19 MED ORDER — LACTATED RINGERS IV BOLUS
1000.0000 mL | Freq: Once | INTRAVENOUS | Status: AC
  Administered 2021-06-19: 1000 mL via INTRAVENOUS

## 2021-06-19 MED ORDER — ALBUTEROL SULFATE (2.5 MG/3ML) 0.083% IN NEBU: 2.5000 mg | INHALATION_SOLUTION | Freq: Four times a day (QID) | RESPIRATORY_TRACT | Status: DC | PRN

## 2021-06-19 MED ORDER — INSULIN ASPART 100 UNIT/ML IJ SOLN
0.0000 [IU] | Freq: Three times a day (TID) | INTRAMUSCULAR | Status: DC
  Administered 2021-06-20 – 2021-06-21 (×4): 1 [IU] via SUBCUTANEOUS
  Administered 2021-06-22: 5 [IU] via SUBCUTANEOUS
  Administered 2021-06-22 – 2021-06-23 (×3): 2 [IU] via SUBCUTANEOUS
  Administered 2021-06-23: 3 [IU] via SUBCUTANEOUS
  Administered 2021-06-23 – 2021-06-24 (×2): 2 [IU] via SUBCUTANEOUS
  Administered 2021-06-24: 3 [IU] via SUBCUTANEOUS
  Administered 2021-06-24: 1 [IU] via SUBCUTANEOUS
  Administered 2021-06-25: 2 [IU] via SUBCUTANEOUS
  Administered 2021-06-25 – 2021-06-26 (×2): 1 [IU] via SUBCUTANEOUS
  Administered 2021-06-26 – 2021-06-27 (×4): 2 [IU] via SUBCUTANEOUS
  Administered 2021-06-27 – 2021-06-28 (×2): 1 [IU] via SUBCUTANEOUS
  Administered 2021-06-28: 3 [IU] via SUBCUTANEOUS
  Administered 2021-06-29: 2 [IU] via SUBCUTANEOUS
  Administered 2021-06-29: 1 [IU] via SUBCUTANEOUS
  Administered 2021-06-29 – 2021-06-30 (×2): 2 [IU] via SUBCUTANEOUS
  Administered 2021-06-30: 3 [IU] via SUBCUTANEOUS

## 2021-06-19 MED ORDER — ATORVASTATIN CALCIUM 80 MG PO TABS
80.0000 mg | ORAL_TABLET | Freq: Every day | ORAL | Status: DC
  Administered 2021-06-20 – 2021-06-30 (×10): 80 mg via ORAL
  Filled 2021-06-19 (×10): qty 1

## 2021-06-19 MED ORDER — AMIODARONE HCL 200 MG PO TABS
200.0000 mg | ORAL_TABLET | Freq: Every day | ORAL | Status: DC
  Administered 2021-06-20 – 2021-06-30 (×10): 200 mg via ORAL
  Filled 2021-06-19 (×10): qty 1

## 2021-06-19 MED ORDER — PANTOPRAZOLE SODIUM 40 MG PO TBEC
40.0000 mg | DELAYED_RELEASE_TABLET | Freq: Two times a day (BID) | ORAL | Status: DC
  Administered 2021-06-20 – 2021-06-30 (×20): 40 mg via ORAL
  Filled 2021-06-19 (×20): qty 1

## 2021-06-19 MED ORDER — ENOXAPARIN SODIUM 40 MG/0.4ML IJ SOSY
40.0000 mg | PREFILLED_SYRINGE | INTRAMUSCULAR | Status: DC
  Filled 2021-06-19: qty 0.4

## 2021-06-19 MED ORDER — CARVEDILOL 6.25 MG PO TABS
6.2500 mg | ORAL_TABLET | Freq: Two times a day (BID) | ORAL | Status: DC
  Administered 2021-06-20 – 2021-06-30 (×20): 6.25 mg via ORAL
  Filled 2021-06-19 (×21): qty 1

## 2021-06-19 MED ORDER — FLUTICASONE FUROATE-VILANTEROL 100-25 MCG/ACT IN AEPB
1.0000 | INHALATION_SPRAY | Freq: Every day | RESPIRATORY_TRACT | Status: DC
Start: 2021-06-20 — End: 2021-06-23
  Administered 2021-06-20 – 2021-06-23 (×4): 1 via RESPIRATORY_TRACT
  Filled 2021-06-19: qty 28

## 2021-06-19 NOTE — ED Notes (Signed)
Called floor for purple man to be added

## 2021-06-19 NOTE — ED Notes (Signed)
Notified phlebotomy to get blood work.

## 2021-06-19 NOTE — H&P (Addendum)
[facility] Admission History and Physical Service Pager: [phone]  Patient name: Rodney [name] Medical record number: [id number] Date of birth: [birth year] Age: 71 y.o. Gender: male  Primary Care Provider: [name], MD Consultants: None Code Status: Full Preferred Emergency Contact:  Contact Information     Name Relation Home Work Mobile   [place] Significant other [phone]  [phone]        Chief Complaint: Diarrhea  Assessment and Plan: Rodney [name] is a 71 y.o. male presenting with multiple weeks of nonbloody nonwatery diarrhea. PMH is significant for type 2 diabetes, obesity, tobacco abuse, hypertension, protein calorie malnutrition, aortic stenosis, anemia of chronic disease, PVD, HFrEF, PAF, s/p BKA, CAD, CKD stage III, COPD  *Patient refused to do home med reconciliation with pharmacy, all home medications listed below are assumed from previous hospitalization and home med list  Diarrhea Patient complains of multiple weeks of diarrhea, states is nonbloody and not watery and denies any other symptoms.  VSS and patient is afebrile.  CMP reveals sodium of 133, AKI 1.88, CBC shows WBC elevated to 11.7 with hemoglobin stable at 9.2.  LR fluid bolus 1 L given in ED. Patient was recently hospitalized from 2/1-06/01/2021 due to COVID-19 pneumonia.  During that hospitalization patient had been complaining of dark sticky stools for the past week.  Will check C. difficile as possible cause of diarrhea.  We will also check GI panel for possible source of diarrhea.  Can consider noninfectious causes of diarrhea including IBD which is less likely as this does not seem to be a recurrent problem, or malabsorptive disorder which is less likely on differential as patient denies any abdominal pain accompanying with his diarrhea. - Admit to [facility], attending Dr. Erin Hearing - C. difficile screen - GI panel - AM BMP - AM CBC - Consider  further fluids if signs of dehydration - Vitals per routine - Full liquid diet, will advance as tolerate - Encourage PO intake  Right foot wounds   concern for cellulitis versus erysipelas PVD   CAD   s/p L BKA Patient has chronic right foot wounds.  When comparing appearance today to photo from last admission, lower right extremity appears to have significantly more pitting edema and erythema.  We will consider cellulitis versus erysipelas of lower right leg however patient denies fevers at home and and is currently afebrile.  Denies pain in his leg.  Will consider Keflex tomorrow if appropriate.  Right lower foot was bandaged, did not remove bandage at this time.  Will consult wound care.  Home medications include Plavix 75 mg daily and atorvastatin 80 mg daily. - Wound care consult - Continue home medications - Consider Keflex, will discuss with attending  AKI on CKD stage III Creatinine of 1.88, baseline appears to be 1.3-1.6.  Suspect AKI due to dehydration from multiple weeks of diarrhea.  LR bolus 1 L given in ED. - AM CMP - Avoid nephrotoxic agents  Normocytic Anemia Hemoglobin 9.2 on admission, stable from 2 weeks ago.  Patient takes ferrous sulfate 325 mg daily.  Unsure of cause of anemia.  Patient not able to undergo EGD due to high risk for anesthesia given aortic stenosis. - Avoid NSAIDs - AM CBC - Holding home iron, consider restarting in the morning if appropriate  COPD Home medications include DuoNeb 3 mL every 4 hours as needed, Trelegy Ellipta 1 puff daily, albuterol 2 puffs every 6 hours as needed - Continue home medications  HFpEF  Severe AS Last echo from 03/24/2021 showed EF of 50 to 55% with severe aortic stenosis.  Patient had wheezing in all lung fields.  Breathing comfortably on room air.  Home medications include Lasix 40 mg daily and Coreg 6.25 mg twice daily - Strict I/Os - Daily weights - Continue home Coreg - Holding home Lasix due to  dehydration  PAF Chronic and stable.  Home meds include amiodarone 200 mg daily. Not currently in afib.  - Cardiac monitoring - ECG ordered - Continue home amiodarone  T2DM Last A1c of 6.9 on 03/24/2021.  Home medications include gabapentin 600 mg at bedtime, Levemir 25 units daily, metformin 1000 mg daily - Sensitive SSI - CBG monitoring with meals and nightly - Restart home medications if appropriate CBGs   GERD - Continue home Protonix 40 mg twice daily  Insomnia - Continue home trazodone 25-50 mg at bedtime as needed for sleep  Tobacco abuse Smokes about 4-5 cigarettes per day, requesting nicotine patch. - Nicotine patch 7 mg  FEN/GI: Full liquid diet Prophylaxis: on Plavix, refused lovenox  Disposition: MedSurg  History of Present Illness:  Rodney [name] is a 72 y.o. male presenting with Diarrhea x2 weeks.   Patient altered in the room. Up until Monday, every day was having bad diarrhea about 6 times per day (started 2 wednesdays ago went 18 times in a 24h period). Not wattery diarrhea, just soft, denies blood  Review Of Systems: Per HPI with the following additions:   Review of Systems  Constitutional:  Negative for chills, fatigue and fever.  Gastrointestinal:  Positive for diarrhea. Negative for abdominal pain and blood in stool.  Genitourinary:  Negative for difficulty urinating.  Neurological:  Negative for weakness.    *Patient states "no to everything on your list" and would not cooperate with further ROS questions  Patient Active Problem List   Diagnosis Date Noted   Pneumonia due to COVID-19 virus 05/29/2021   Malnutrition of moderate degree 05/09/2021   Acute respiratory failure with hypoxia (HCC)    Acute on chronic heart failure with preserved ejection fraction (HFpEF) (HCC)    Chronic respiratory failure with hypoxia ([place]) 03/26/2021   Acute renal failure superimposed on stage 3a chronic kidney disease ([place]) 03/26/2021   Demand ischemia ([place])  03/26/2021   Iron (Fe) deficiency anemia 03/26/2021   Hyperkalemia 12/10/2020   COPD exacerbation ([place]) 12/09/2020   Acute combined systolic and diastolic congestive heart failure ([place])    Peripheral neuropathy 11/11/2020   Open wound of right great toe    Supratherapeutic INR 11/10/2020   History of DVT (deep vein thrombosis) 11/10/2020   Acute on chronic anemia    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   CAD (coronary artery disease) 02/15/2020   CKD stage 3 due to type 2 diabetes mellitus ([place]) 02/15/2020   Long term (current) use of anticoagulants 12/29/2019   S/P BKA (below knee amputation) unilateral, left ([place]) 11/14/2019   High risk social situation 11/14/2019   AF (paroxysmal atrial fibrillation) ([place]) 10/03/2019   HFrEF (heart failure with reduced ejection fraction) ([place]) 09/28/2019   Severe aortic stenosis 09/28/2019   Anemia of chronic disease 09/27/2019   Protein calorie malnutrition ([place])    PVD (peripheral vascular disease) ([place])    Umbilical hernia 41/66/0630   Obesity 10/09/2015   Tobacco abuse 09/07/2013   DM (diabetes mellitus), type 2 with neurological complications ([place]) 16/04/930   Hypercholesteremia 10/28/2010   ERECTILE DYSFUNCTION 05/22/2009  Essential hypertension 01/23/2009    Past Medical History: Past Medical History:  Diagnosis Date   Aortic stenosis    moderate in 2022   Atrial fibrillation (HCC)    CHF (congestive heart failure) (HCC)    Coronary artery disease    Diabetes mellitus without complication ([place])    HLD (hyperlipidemia)    Hypertension    Peripheral arterial disease ([place])     Past Surgical History: Past Surgical History:  Procedure Laterality Date   ABDOMINAL AORTOGRAM W/LOWER EXTREMITY N/A 08/05/2020   Procedure: ABDOMINAL AORTOGRAM W/LOWER EXTREMITY;  Surgeon: Marty Heck, MD;  Location: [facility];  Service: Cardiovascular;  Laterality: N/A;   ABDOMINAL AORTOGRAM W/LOWER  EXTREMITY N/A 11/13/2020   Procedure: ABDOMINAL AORTOGRAM W/LOWER EXTREMITY;  Surgeon: Cherre Robins, MD;  Location: [facility];  Service: Cardiovascular;  Laterality: N/A;   ABDOMINAL AORTOGRAM W/LOWER EXTREMITY N/A 05/12/2021   Procedure: ABDOMINAL AORTOGRAM W/LOWER EXTREMITY;  Surgeon: Waynetta Sandy, MD;  Location: [facility];  Service: Cardiovascular;  Laterality: N/A;   AMPUTATION Left 09/28/2019   Procedure: AMPUTATION BELOW KNEE;  Surgeon: Newt Minion, MD;  Location: [facility];  Service: Orthopedics;  Laterality: Left;   AMPUTATION Right 11/15/2020   Procedure: RIGHT GREAT TOE AMPUTATION;  Surgeon: Newt Minion, MD;  Location: [facility];  Service: Orthopedics;  Laterality: Right;   CARDIAC CATHETERIZATION     CARDIOVERSION N/A 10/05/2019   Procedure: CARDIOVERSION;  Surgeon: Sanda Klein, MD;  Location: [facility];  Service: Cardiovascular;  Laterality: N/A;   FEMORAL-POPLITEAL BYPASS GRAFT Right 08/07/2020   Procedure: RIGHT FEMORAL TO BELOW KNEE POPLITEAL ARTERY BYPASS;  Surgeon: Waynetta Sandy, MD;  Location: [facility];  Service: Vascular;  Laterality: Right;   LEFT HEART CATH AND CORONARY ANGIOGRAPHY N/A 10/03/2019   Procedure: LEFT HEART CATH AND CORONARY ANGIOGRAPHY;  Surgeon: Lorretta Harp, MD;  Location: [facility];  Service: Cardiovascular;  Laterality: N/A;   PERIPHERAL VASCULAR INTERVENTION Right 08/05/2020   Procedure: PERIPHERAL VASCULAR INTERVENTION;  Surgeon: Marty Heck, MD;  Location: [facility];  Service: Cardiovascular;  Laterality: Right;  common Iliac   PERIPHERAL VASCULAR INTERVENTION Left 11/13/2020   Procedure: PERIPHERAL VASCULAR INTERVENTION;  Surgeon: Cherre Robins, MD;  Location: [facility];  Service: Cardiovascular;  Laterality: Left;   PERIPHERAL VASCULAR INTERVENTION Right 11/14/2020   Procedure: PERIPHERAL VASCULAR INTERVENTION;  Surgeon: Marty Heck, MD;  Location: [facility];  Service: Cardiovascular;  Laterality: Right;  POP/SFA STENT   PERIPHERAL VASCULAR INTERVENTION  05/12/2021   Procedure: PERIPHERAL VASCULAR INTERVENTION;  Surgeon: Waynetta Sandy, MD;  Location: [facility];  Service: Cardiovascular;;   TEE WITHOUT CARDIOVERSION N/A 10/05/2019   Procedure: TRANSESOPHAGEAL ECHOCARDIOGRAM (TEE);  Surgeon: Sanda Klein, MD;  Location: [facility];  Service: Cardiovascular;  Laterality: N/A;    Social History: Social History   Tobacco Use   Smoking status: Former    Packs/day: 0.50    Years: 50.00    Pack years: 25.00    Types: Cigarettes    Start date: 04/09/2021   Smokeless tobacco: Never  Vaping Use   Vaping Use: Never used  Substance Use Topics   Alcohol use: Yes    Alcohol/week: 6.0 standard drinks    Types: 6 Standard drinks or equivalent per week    Comment: 11/07/20 - states he has not drank in 6 months   Drug use: No  Family History: Family History  Problem Relation Age of Onset   Alcoholism Mother    Alcoholism Father      Allergies and Medications: No Known Allergies No current facility-administered medications on file prior to encounter.   Current Outpatient Medications on File Prior to Encounter  Medication Sig Dispense Refill   acetaminophen (TYLENOL) 650 MG CR tablet Take 1,300 mg by mouth in the morning and at bedtime.     albuterol (VENTOLIN HFA) 108 (90 Base) MCG/ACT inhaler INHALE 2 PUFFS INTO THE LUNGS EVERY 6 (SIX) HOURS AS NEEDED FOR WHEEZING OR SHORTNESS OF BREATH. 1 each 0   amiodarone (PACERONE) 200 MG tablet Take 1 tablet (200 mg total) by mouth daily. 90 tablet 3   atorvastatin (LIPITOR) 80 MG tablet TAKE 1 TABLET EVERY DAY (Patient taking differently: Take 80 mg by mouth daily.) 90 tablet 0   Blood Glucose Monitoring Suppl (TRUE METRIX METER) DEVI Use to test blood sugar three times daily. (Patient not taking: Reported on 05/07/2021) 1 each 0   Blood Glucose Monitoring Suppl (TRUE  METRIX METER) w/Device KIT USE AS DIRECTED (Patient not taking: Reported on 05/07/2021) 1 kit 0   carvedilol (COREG) 12.5 MG tablet Take 6.25 mg by mouth 2 (two) times daily with a meal.     clopidogrel (PLAVIX) 75 MG tablet TAKE 1 TABLET EVERY DAY WITH BREAKFAST (Patient taking differently: Take 75 mg by mouth daily.) 90 tablet 3   ferrous sulfate 325 (65 FE) MG tablet Take 1 tablet (325 mg total) by mouth every other day. (Patient taking differently: Take 325 mg by mouth daily with breakfast.) 30 tablet 0   fluticasone (FLONASE) 50 MCG/ACT nasal spray Place 2 sprays into both nostrils daily. 16 g 6   Fluticasone-Umeclidin-Vilant (TRELEGY ELLIPTA) 100-62.5-25 MCG/ACT AEPB Inhale 1 puff into the lungs daily.     furosemide (LASIX) 40 MG tablet Take 1 tablet (40 mg total) by mouth daily as needed for fluid (swelling). (Patient taking differently: Take 40 mg by mouth daily.) 30 tablet 1   gabapentin (NEURONTIN) 300 MG capsule Take 600 mg by mouth at bedtime.     glucose blood (RELION TRUE METRIX TEST STRIPS) test strip Use to test blood sugar three times per day. (Patient not taking: Reported on 05/07/2021) 300 each 3   insulin detemir (LEVEMIR) 100 UNIT/ML FlexPen Inject 25 Units into the skin daily. 15 mL 11   Insulin Pen Needle 32G X 4 MM MISC Use to inject insulin up to 4 times daily as directed, 100 each 0   ipratropium-albuterol (DUONEB) 0.5-2.5 (3) MG/3ML SOLN Take 3 mLs by nebulization every 4 (four) hours as needed. 300 mL 3   lactose free nutrition (BOOST) LIQD Take 237 mLs by mouth 3 (three) times a week.     metFORMIN (GLUCOPHAGE) 1000 MG tablet Take 1 tablet (1,000 mg total) by mouth daily. 90 tablet 3   pantoprazole (PROTONIX) 40 MG tablet Take 1 tablet (40 mg total) by mouth 2 (two) times daily. 60 tablet 0   polyethylene glycol (MIRALAX / GLYCOLAX) 17 g packet Take 17 g by mouth daily as needed for moderate constipation. (Patient not taking: Reported on 05/01/2021) 14 each 0   traZODone  (DESYREL) 50 MG tablet Take 0.5-1 tablets (25-50 mg total) by mouth at bedtime as needed for sleep. 30 tablet 3   TRUEplus Lancets 33G MISC Use to test blood sugar three times per day. (Patient not taking: Reported on 05/07/2021) 300 each 3  Objective: BP 121/68    Pulse 78    Temp 99.8 F (37.7 C) (Oral)    Resp (!) 21    Ht 6' (1.829 m)    Wt 104.3 kg    SpO2 98%    BMI 31.19 kg/m  Exam: General: Patient lying in bed, NAD Eyes: White sclera, clear conjunctiva Cardiovascular: RRR, normal S1/S2 Respiratory: Wheezes throughout, breathing comfortably on room air Gastrointestinal: Soft, nondistended, nontender to palpation Extremities: BKA of LLE, right lower foot bandaged, RLE with erythema, warmth, +3 pitting edema (see photo in media tab) Neuro: No focal deficits Psych: Patient not wanting to answer questions but mood and affect appropriate for situation  Labs and Imaging: CBC BMET  Recent Labs  Lab 06/19/21 1717  WBC 11.7*  HGB 9.2*  HCT 29.7*  PLT 269   Recent Labs  Lab 06/19/21 1717  NA 133*  K 4.4  CL 98  CO2 28  BUN 27*  CREATININE 1.88*  GLUCOSE 106*  CALCIUM 7.6*     EKG: Melrose Nakayama, DO 06/19/2021, 7:32 PM PGY-1, [facility] Intern pager: [phone], text pages welcome     FPTS Upper-Level Resident Addendum   I have independently interviewed and examined the patient. I have discussed the above with the original author and agree with their documentation. My edits for correction/addition/clarification are in within the document. Please see also any attending notes.   Rise Patience, DO  PGY-2, [facility] 06/19/2021 11:20 PM  FPTS Service pager: [phone] (text pages welcome through [place])

## 2021-06-19 NOTE — ED Triage Notes (Signed)
Pt bib ems from home c/o diarrhea for pass three weeks. Pt denies N/V and stomach pain. Pt denies taking abx. Pt has taken imodium but it has not helped.   BP 114/74 HR 76 RR 17 Nasal Canula 2.5L 93% CBG 136

## 2021-06-19 NOTE — ED Provider Notes (Signed)
[facility] EMERGENCY DEPARTMENT Provider Note   CSN: [id number] Arrival date & time: 06/19/21  1528     History  Chief Complaint  Patient presents with   Diarrhea    Rodney [name] is a 71 y.o. male.  HPI 71 year old male presents with diarrhea.  This has been ongoing for about 2 weeks.  At 1 point several days ago he had a couple days where he was going 12-15 times a day.  It was to the point that he was having incontinence and could not stop the diarrhea.  Recently he has been taking Imodium and it seems to be helping but he is also stopped eating to help prevent diarrhea.  He has been getting progressively weaker for the last several months due to in and out hospitalizations.  However he denies feeling severely weak due to this.  No blood in the stool.  No fevers, vomiting, abdominal pain.  No recent antibiotics but he was recently admitted to the hospital this month for COVID.  PMH significant for CHF, HTN, CAD, DM, Afib, Aortic stenosis.  Home Medications Prior to Admission medications   Medication Sig Start Date End Date Taking? Authorizing Provider  acetaminophen (TYLENOL) 650 MG CR tablet Take 1,300 mg by mouth in the morning and at bedtime.    [provider]  albuterol (VENTOLIN HFA) 108 (90 Base) MCG/ACT inhaler INHALE 2 PUFFS INTO THE LUNGS EVERY 6 (SIX) HOURS AS NEEDED FOR WHEEZING OR SHORTNESS OF BREATH. 04/24/21   [name], MD  amiodarone (PACERONE) 200 MG tablet Take 1 tablet (200 mg total) by mouth daily. 10/14/20   [name], MD  atorvastatin (LIPITOR) 80 MG tablet TAKE 1 TABLET EVERY DAY Patient taking differently: Take 80 mg by mouth daily. 03/11/21   O'NealCassie Freer, MD  Blood Glucose Monitoring Suppl (TRUE METRIX METER) DEVI Use to test blood sugar three times daily. Patient not taking: Reported on 05/07/2021 11/20/19   [name], MD  Blood Glucose Monitoring Suppl (TRUE METRIX METER) w/Device KIT USE AS  DIRECTED Patient not taking: Reported on 05/07/2021 03/25/20   [name], MD  carvedilol (COREG) 12.5 MG tablet Take 6.25 mg by mouth 2 (two) times daily with a meal. 12/18/20   [provider]  clopidogrel (PLAVIX) 75 MG tablet TAKE 1 TABLET EVERY DAY WITH BREAKFAST Patient taking differently: Take 75 mg by mouth daily. 03/17/21   [name], MD  ferrous sulfate 325 (65 FE) MG tablet Take 1 tablet (325 mg total) by mouth every other day. Patient taking differently: Take 325 mg by mouth daily with breakfast. 05/18/21   [name], Weldon Picking, DO  fluticasone (FLONASE) 50 MCG/ACT nasal spray Place 2 sprays into both nostrils daily. 02/13/21   Simmons-Robinson, Makiera, MD  Fluticasone-Umeclidin-Vilant (TRELEGY ELLIPTA) 100-62.5-25 MCG/ACT AEPB Inhale 1 puff into the lungs daily. 06/01/21   Eppie Gibson, MD  furosemide (LASIX) 40 MG tablet Take 1 tablet (40 mg total) by mouth daily as needed for fluid (swelling). Patient taking differently: Take 40 mg by mouth daily. 02/13/21   Simmons-Robinson, Makiera, MD  gabapentin (NEURONTIN) 300 MG capsule Take 600 mg by mouth at bedtime.    [provider]  glucose blood (RELION TRUE METRIX TEST STRIPS) test strip Use to test blood sugar three times per day. Patient not taking: Reported on 05/07/2021 11/20/19   [name], MD  insulin detemir (LEVEMIR) 100 UNIT/ML FlexPen Inject 25 Units into the skin  daily. 05/16/21   Shary Key, DO  Insulin Pen Needle 32G X 4 MM MISC Use to inject insulin up to 4 times daily as directed, 05/16/21     ipratropium-albuterol (DUONEB) 0.5-2.5 (3) MG/3ML SOLN Take 3 mLs by nebulization every 4 (four) hours as needed. 05/19/21   [name], MD  lactose free nutrition (BOOST) LIQD Take 237 mLs by mouth 3 (three) times a week.    [provider]  metFORMIN (GLUCOPHAGE) 1000 MG tablet Take 1 tablet (1,000 mg total) by mouth daily. 04/07/21   Lavina Hamman, MD  pantoprazole  (PROTONIX) 40 MG tablet Take 1 tablet (40 mg total) by mouth 2 (two) times daily. 05/16/21 06/15/21  Shary Key, DO  polyethylene glycol (MIRALAX / GLYCOLAX) 17 g packet Take 17 g by mouth daily as needed for moderate constipation. Patient not taking: Reported on 05/01/2021 03/28/21   Lavina Hamman, MD  traZODone (DESYREL) 50 MG tablet Take 0.5-1 tablets (25-50 mg total) by mouth at bedtime as needed for sleep. 05/19/21   [name], MD  TRUEplus Lancets 33G MISC Use to test blood sugar three times per day. Patient not taking: Reported on 05/07/2021 11/20/19   [name], MD      Allergies    Patient has no known allergies.    Review of Systems   Review of Systems  Constitutional:  Negative for fever.  Gastrointestinal:  Positive for diarrhea. Negative for abdominal pain, blood in stool and vomiting.  Neurological:  Positive for weakness.   Physical Exam Updated Vital Signs BP (!) 123/58 (BP Location: Right Arm)    Pulse 82    Temp 98.8 F (37.1 C) (Oral)    Resp 20    Ht 6' (1.829 m)    Wt 121.2 kg    SpO2 92%    BMI 36.24 kg/m  Physical Exam Vitals and nursing note reviewed.  Constitutional:      Appearance: He is well-developed. He is obese.  HENT:     Head: Normocephalic and atraumatic.  Cardiovascular:     Rate and Rhythm: Normal rate and regular rhythm.     Heart sounds: Murmur heard.  Pulmonary:     Effort: Pulmonary effort is normal.     Breath sounds: Wheezing (mild) present.  Abdominal:     General: There is no distension.     Palpations: Abdomen is soft.     Tenderness: There is no abdominal tenderness.  Skin:    General: Skin is warm and dry.  Neurological:     Mental Status: He is alert.    ED Results / Procedures / Treatments   Labs (all labs ordered are listed, but only abnormal results are displayed) Labs Reviewed  COMPREHENSIVE METABOLIC PANEL - Abnormal; Notable for the following components:      Result Value   Sodium 133 (*)     Glucose, Bld 106 (*)    BUN 27 (*)    Creatinine, Ser 1.88 (*)    Calcium 7.6 (*)    Total Protein 5.1 (*)    Albumin 2.0 (*)    GFR, Estimated 38 (*)    All other components within normal limits  CBC WITH DIFFERENTIAL/PLATELET - Abnormal; Notable for the following components:   WBC 11.7 (*)    RBC 3.41 (*)    Hemoglobin 9.2 (*)    HCT 29.7 (*)    RDW 18.9 (*)    Neutro Abs 9.6 (*)  All other components within normal limits  CBG MONITORING, ED - Abnormal; Notable for the following components:   Glucose-Capillary 119 (*)    All other components within normal limits  GASTROINTESTINAL PANEL BY PCR, STOOL (REPLACES STOOL CULTURE)  C DIFFICILE QUICK SCREEN W PCR REFLEX    CBC  BASIC METABOLIC PANEL    EKG None  Radiology No results found.  Procedures Procedures    Medications Ordered in ED Medications  enoxaparin (LOVENOX) injection 40 mg (40 mg Subcutaneous Not Given 06/19/21 2144)  amiodarone (PACERONE) tablet 200 mg (has no administration in time range)  atorvastatin (LIPITOR) tablet 80 mg (has no administration in time range)  carvedilol (COREG) tablet 6.25 mg (has no administration in time range)  traZODone (DESYREL) tablet 25-50 mg (has no administration in time range)  pantoprazole (PROTONIX) EC tablet 40 mg (has no administration in time range)  clopidogrel (PLAVIX) tablet 75 mg (has no administration in time range)  gabapentin (NEURONTIN) capsule 600 mg (has no administration in time range)  albuterol (PROVENTIL) (2.5 MG/3ML) 0.083% nebulizer solution 2.5 mg (has no administration in time range)  fluticasone (FLONASE) 50 MCG/ACT nasal spray 2 spray (has no administration in time range)  fluticasone furoate-vilanterol (BREO ELLIPTA) 100-25 MCG/ACT 1 puff (has no administration in time range)    And  umeclidinium bromide (INCRUSE ELLIPTA) 62.5 MCG/ACT 1 puff (has no administration in time range)  ipratropium-albuterol (DUONEB) 0.5-2.5 (3) MG/3ML nebulizer solution  3 mL (has no administration in time range)  insulin aspart (novoLOG) injection 0-9 Units (has no administration in time range)  nicotine (NICODERM CQ - dosed in mg/24 hr) patch 7 mg (7 mg Transdermal Patch Applied 06/19/21 2339)  lactated ringers bolus 1,000 mL (0 mLs Intravenous Stopped 06/19/21 1854)    ED Course/ Medical Decision Making/ A&P                            Patient is hemodynamically stable but he does appear to have acute on chronic kidney disease.  Likely this is prerenal based on diarrhea.  He was COVID-positive earlier this month so I do not think it needs to be retested.  Will order C. difficile and GI pathogen panel testing but he has not had further diarrhea while in the ED.  We decided to give IV fluids and will keep on fluids and admit to family practice.  I have discussed with the residency service who will admit.        Final Clinical Impression(s) / ED Diagnoses Final diagnoses:  Acute kidney injury superimposed on chronic kidney disease [facility])    Rx / DC Orders ED Discharge Orders     None         Sherwood Gambler, MD 06/19/21 2356

## 2021-06-19 NOTE — ED Notes (Signed)
RN allowed pt to use phone d/t room phone not working. Pt contacted girlfriend to update his status.  Provided pt bag lunch per EDP order

## 2021-06-20 ENCOUNTER — Other Ambulatory Visit (HOSPITAL_COMMUNITY): Payer: Self-pay

## 2021-06-20 ENCOUNTER — Encounter (HOSPITAL_COMMUNITY): Payer: Self-pay | Admitting: Family Medicine

## 2021-06-20 ENCOUNTER — Inpatient Hospital Stay (HOSPITAL_COMMUNITY): Payer: Medicare PPO

## 2021-06-20 DIAGNOSIS — Z6836 Body mass index (BMI) 36.0-36.9, adult: Secondary | ICD-10-CM | POA: Insufficient documentation

## 2021-06-20 DIAGNOSIS — A0472 Enterocolitis due to Clostridium difficile, not specified as recurrent: Principal | ICD-10-CM

## 2021-06-20 DIAGNOSIS — I96 Gangrene, not elsewhere classified: Secondary | ICD-10-CM

## 2021-06-20 DIAGNOSIS — E8809 Other disorders of plasma-protein metabolism, not elsewhere classified: Secondary | ICD-10-CM | POA: Diagnosis present

## 2021-06-20 DIAGNOSIS — L8990 Pressure ulcer of unspecified site, unspecified stage: Secondary | ICD-10-CM | POA: Diagnosis present

## 2021-06-20 LAB — C DIFFICILE QUICK SCREEN W PCR REFLEX
C Diff antigen: POSITIVE — AB
C Diff toxin: POSITIVE — AB

## 2021-06-20 LAB — BASIC METABOLIC PANEL
Anion gap: 8 (ref 5–15)
BUN: 28 mg/dL — ABNORMAL HIGH (ref 8–23)
CO2: 27 mmol/L (ref 22–32)
Calcium: 7.7 mg/dL — ABNORMAL LOW (ref 8.9–10.3)
Chloride: 100 mmol/L (ref 98–111)
Creatinine, Ser: 1.76 mg/dL — ABNORMAL HIGH (ref 0.61–1.24)
GFR, Estimated: 41 mL/min — ABNORMAL LOW (ref >60)
Glucose, Bld: 158 mg/dL — ABNORMAL HIGH (ref 70–99)
Potassium: 4.4 mmol/L (ref 3.5–5.1)
Sodium: 135 mmol/L (ref 135–145)

## 2021-06-20 LAB — CBC
HCT: 28 % — ABNORMAL LOW (ref 39.0–52.0)
Hemoglobin: 8.8 g/dL — ABNORMAL LOW (ref 13.0–17.0)
MCH: 27.3 pg (ref 26.0–34.0)
MCHC: 31.4 g/dL (ref 30.0–36.0)
MCV: 87 fL (ref 80.0–100.0)
Platelets: 277 10*3/uL (ref 150–400)
RBC: 3.22 MIL/uL — ABNORMAL LOW (ref 4.22–5.81)
RDW: 19 % — ABNORMAL HIGH (ref 11.5–15.5)
WBC: 10.1 10*3/uL (ref 4.0–10.5)
nRBC: 0 % (ref 0.0–0.2)

## 2021-06-20 LAB — GASTROINTESTINAL PANEL BY PCR, STOOL (REPLACES STOOL CULTURE)

## 2021-06-20 LAB — GLUCOSE, CAPILLARY
Glucose-Capillary: 145 mg/dL — ABNORMAL HIGH (ref 70–99)
Glucose-Capillary: 130 mg/dL — ABNORMAL HIGH (ref 70–99)
Glucose-Capillary: 109 mg/dL — ABNORMAL HIGH (ref 70–99)
Glucose-Capillary: 130 mg/dL — ABNORMAL HIGH (ref 70–99)

## 2021-06-20 IMAGING — DX DG CHEST 1V PORT
1 series · 1 of 1 positions shown · non-contrast
Comparison: [DATE]

CLINICAL DATA: 70-year-old male with history of COVID pneumonia,
shortness of breath.

EXAM:
PORTABLE CHEST - 1 VIEW

[chest]
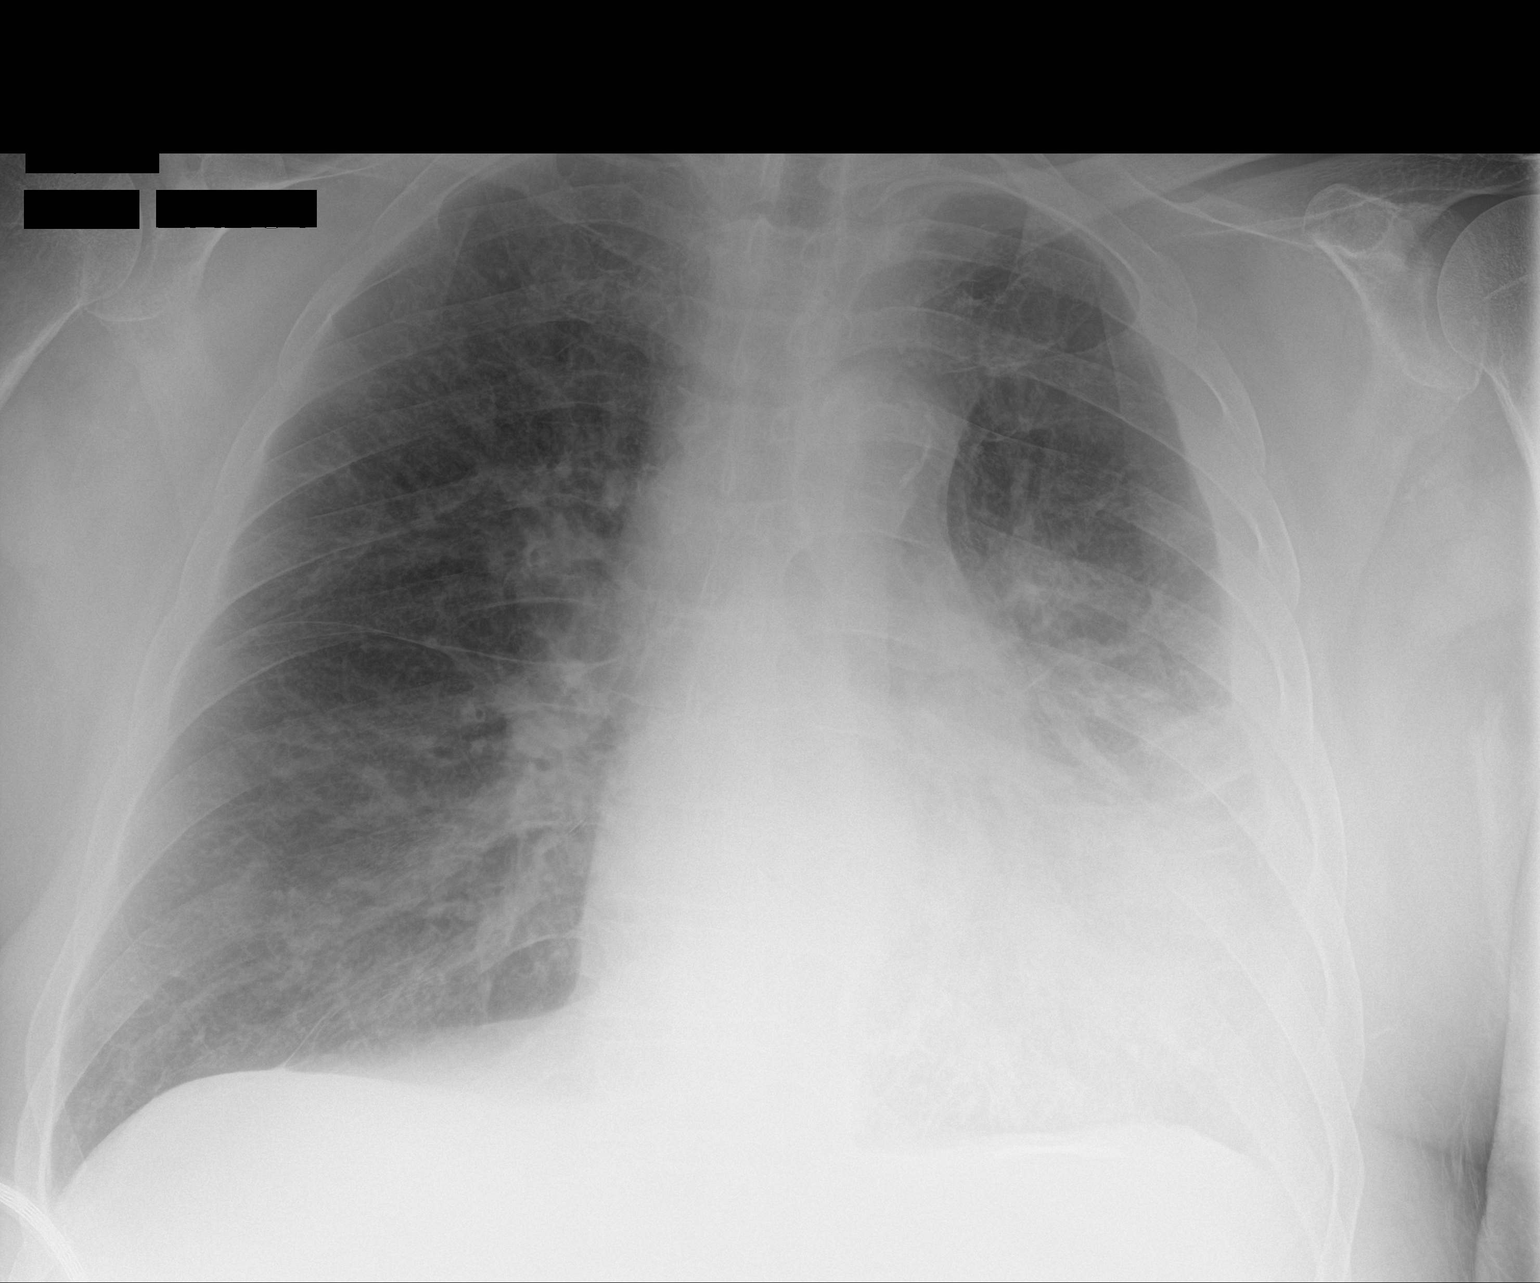

[1 of 1 positions shown; findings below may reference images not displayed]

FINDINGS: The mediastinal contours are within normal limits, partially
obscured. Unchanged cardiomegaly. Atherosclerotic calcification of
the aortic arch persistent small left pleural effusion. Improved
aeration of the left lung with persistent basilar opacities. Faint
right basilar pulmonary opacities. No acute osseous abnormality.
IMPRESSION: 1. Persistent small left pleural effusion with left basal and
lingular pulmonary opacities, likely secondary to recent pneumonia.
2. Right basilar subsegmental atelectasis.

## 2021-06-20 MED ORDER — GERHARDT'S BUTT CREAM
TOPICAL_CREAM | Freq: Two times a day (BID) | CUTANEOUS | Status: AC
  Administered 2021-06-20: 1 via TOPICAL
  Filled 2021-06-20 (×2): qty 1

## 2021-06-20 MED ORDER — INSULIN DETEMIR 100 UNIT/ML ~~LOC~~ SOLN
13.0000 [IU] | Freq: Every day | SUBCUTANEOUS | Status: DC
  Administered 2021-06-20 – 2021-06-30 (×10): 13 [IU] via SUBCUTANEOUS
  Filled 2021-06-20 (×11): qty 0.13

## 2021-06-20 MED ORDER — METFORMIN HCL 500 MG PO TABS: 1000.0000 mg | ORAL_TABLET | Freq: Every day | ORAL | Status: DC

## 2021-06-20 MED ORDER — ENOXAPARIN SODIUM 40 MG/0.4ML IJ SOSY
40.0000 mg | PREFILLED_SYRINGE | INTRAMUSCULAR | Status: DC
  Administered 2021-06-20 – 2021-06-21 (×2): 40 mg via SUBCUTANEOUS
  Filled 2021-06-20 (×2): qty 0.4

## 2021-06-20 MED ORDER — VANCOMYCIN HCL 125 MG PO CAPS
125.0000 mg | ORAL_CAPSULE | Freq: Four times a day (QID) | ORAL | Status: DC
  Filled 2021-06-20 (×2): qty 1

## 2021-06-20 MED ORDER — LACTATED RINGERS IV BOLUS
250.0000 mL | Freq: Once | INTRAVENOUS | Status: AC
  Administered 2021-06-20: 250 mL via INTRAVENOUS

## 2021-06-20 MED ORDER — FIDAXOMICIN 200 MG PO TABS
200.0000 mg | ORAL_TABLET | Freq: Two times a day (BID) | ORAL | Status: AC
  Administered 2021-06-20 – 2021-06-29 (×18): 200 mg via ORAL
  Filled 2021-06-20 (×20): qty 1

## 2021-06-20 MED ORDER — FIDAXOMICIN 200 MG PO TABS
200.0000 mg | ORAL_TABLET | Freq: Two times a day (BID) | ORAL | Status: DC
  Administered 2021-06-20: 200 mg via ORAL
  Filled 2021-06-20: qty 1

## 2021-06-20 NOTE — Progress Notes (Signed)
Patient has order for sequential compression devices however he is a left below the knee amputee and he has a inflammation to the rle with blister. RN will not apply SCDs at this time.

## 2021-06-20 NOTE — Progress Notes (Addendum)
Family Medicine Teaching Service Daily Progress Note Intern Pager: [phone]  Patient name: Rodney [name] Medical record number: [id number] Date of birth: 09/04/1950 Age: 71 y.o. Gender: male  Primary Care Provider: [name], MD Consultants: None Code Status: Full  Pt Overview and Major Events to Date:  2/24- admitted  Assessment and Plan: Rodney [name] is a 71 y.o. male presenting with multiple weeks of nonbloody nonwatery diarrhea. PMH is significant for type 2 diabetes, obesity, tobacco abuse, hypertension, protein calorie malnutrition, aortic stenosis, anemia of chronic disease, PVD, HFrEF, PAF, s/p BKA, CAD, CKD stage III, COPD  C Difficile Colitis C Diff antigen and toxin positive. Patient remains afebrile. Vitals stable. Patient was clinically hypovolemic on admission but responded nicely to 1L LR in the ED. He received a first dose of fidaxomicin this morning.  Unfortunately his copay for fidaxomicin would be $1500 but he has been approved for patient assistance. He remains clinically hypovolemic.  - Oral Fidaxomicin for 10 days - POAL - Will give a 241mL LR bolus  R foot wounds   PVD   CAD   s/p L BKA Chronic wounds do appear worse today compared to his last admission, I worry this represents progression of his vascular disease. He missed an appointment with vascular surgery on 2/14 secondary to his diarrhea and weakness. Pretibial area of skin eruption worse today compared to 3 weeks ago, however, given that inflammation is localized to the borders of his pre-existing lesion, do not believe this represents superinfection. Likely chronic stasis changes.  - Continue home Plavix and Atorvastatin 80mg  daily - Wound care per WOC, WOC recommends vascular consult given appearance of wounds - Will consult vascular surgery - No antibiotics for now  AKI on CKD IIIa Cr 1.88>1.76.  - POAL - Small fluid bolus as above - Renal dose adjustment of meds  Recent COVID    Pleural Effusion Patient's last CXR on 2/2 showed a loculated left pleural effusion. We elected not to attempt thoracentesis during his last hospitalization because his respiratory status was stable. -Will obtain a repeat chest x-ray today to ensure resolution.  If pleural effusion has worsened, will consider thoracentesis or consult to pulmonology  Normocytic Anemia of Chronic Kidney Disease  Hgb 9.2>8.8. Stable. At recent baseline. Not an EGD candidate. No evidence of active blood loss. Takes ferrous sulfate daily at home.  - Holding home iron  COPD At baseline, intermittently on room air and 2L, which is his baseline.  - Continue home Duonebs q4h PRN - Breo + Incruse  HFpEF   Severe AS No evidence of volume overload. Had been holding Lasix in the setting of hypovolemia on admission. Net +1L since admission. - Continue home Coreg 6.25mg  BID - Holding home Lasix in the setting of hypovolemia  Paroxysmal A Fib Chronic, stable. In sinus rhythm. - Continue home amiodarone 200mg  daily  T2DM Glucose 119-158 overnight. Patient has been considered for an SGLT2i in the past but could not afford this.  - Continue gabapentin 600mg  QHS - LAI 13u daily - Hold metformin - sSSI, CBGs  GERD - Protonix 40mg  BID  Insomnia - Trazodone 25-50mg  QHS  Tobacco Abuse - Nicotine patch  FEN/GI: Carb modified PPx: Lovenox Dispo:SNF in 2-3 days. Barriers include clinical improvement.   Subjective:  Rodney [name] reports feeling frustrated that he is back in the hospital and is disappointed that he has become incontinent of stool with this current illness. He denies any abdominal pain, fever, or chills. He would  very much like to eat breakfast.   Objective: Temp:  [98.4 F (36.9 C)-99.8 F (37.7 C)] 98.4 F (36.9 C) (02/24 0357) Pulse Rate:  [74-84] 81 (02/24 0357) Resp:  [9-26] 19 (02/24 0357) BP: (101-129)/(58-83) 107/65 (02/24 0357) SpO2:  [92 %-100 %] 97 % (02/24 0357) Weight:  [104.3  kg-121.2 kg] 121.2 kg (02/23 2300) Physical Exam: General: Patient sleeping on arrival, awoke easily to voice, chronically ill and tired appearing but nontoxic Cardiovascular: Harsh systolic murmur, regular rate and rhythm Respiratory: Normal WOB on 2L, no wheezes to my exam, poor movement but at his baseline based on my previous exams Abdomen: Non-tender, non-distended Extremities: RLE with area of skin eruption and tense bulla, now with crusting on lateral border. R second toe necrotic changes worse today compared to my exam three weeks ago.   Laboratory: Recent Labs  Lab 06/19/21 1717 06/20/21 0457  WBC 11.7* 10.1  HGB 9.2* 8.8*  HCT 29.7* 28.0*  PLT 269 277   Recent Labs  Lab 06/19/21 1717 06/20/21 0457  NA 133* 135  K 4.4 4.4  CL 98 100  CO2 28 27  BUN 27* 28*  CREATININE 1.88* 1.76*  CALCIUM 7.6* 7.7*  PROT 5.1*  --   BILITOT 0.5  --   ALKPHOS 82  --   ALT 25  --   AST 18  --   GLUCOSE 106* 158*    Imaging/Diagnostic Tests: No new imaging, tests   Rodney Gibson, MD 06/20/2021, 6:49 AM PGY-1, [place] Intern pager: [phone], text pages welcome

## 2021-06-20 NOTE — Consult Note (Signed)
WOC Nurse Consult Note: Reason for Consult: Right LE and foot, also noted is 2nd digit with ischemic wound/eschar measuring 1cm round, also metatarsal heads with dry wounds noted at digits 3 and 4 (plantar aspect of foot), MASD vs pressure, Stage 2 Wound type: Neuropathic, infection Pressure Injury POA: Yes Measurements: RLE pretibial area:12cm x 12 cm area of erythema and warmth with a 0.5cm round serum filled blister noted. No exudate Right anterior foot: 6cm x 9cm area of erythema and warmth with 3 partial thickness wound embedded within this erythema, the largest of which measures 2cm x 1.5cm x 0.1cm. Pink, moist wound bed, no exudate. Buttocks and gluteal cleft with partial thickness, small lesions (<1cm), pressure vs moisture  Wound bed:As noted above Drainage (amount, consistency, odor) None Periwound: As noted above, intact. Dressing procedure/placement/frequency: Patient turns and repositions independently. He is taught to minimize time in the supine position and that a cream would be provided for his "irritations". I will provide Gerhart's Butt Cream for this purpose. Right foot with neuropathic ulcerations to digits and metatarsal heads, also anterior foot wound and pretibial skin eruption. Topical care guidance is provided using xeroform gauze topped with dry gauze and secured with Kerlix roll gauze, but recommend consultation with vascular surgery of orthopedics for diagnostics, follow up, and offloading for limb salvage.  If you agree, please order.  I am assisted in my assessment by the admitting RN, Laurier Nancy and her expertise is insightful and appreciated.  [facility] nursing team will not follow, but will remain available to this patient, the nursing and medical teams.  Please re-consult if needed. Thanks, Maudie Flakes, MSN, RN, [place], Arther Abbott  Pager# [phone]

## 2021-06-20 NOTE — TOC Benefit Eligibility Note (Signed)
Patient Advocate Encounter  Prior Authorization for Vancomycin 125 mg capsules has been approved.    PA# [id number] Effective dates: 06/20/2021 through 04/26/2022  Patients co-pay is $104.86.     Lyndel Safe, [place] Patient Advocate Specialist [place] Patient Advocate Team Direct Number: [phone]  Fax: [phone]

## 2021-06-20 NOTE — TOC Benefit Eligibility Note (Signed)
Patient Teacher, English as a foreign language completed.    The patient is currently admitted and upon discharge could be taking Vancomycin 125 mg Capsules.  Requires Prior Authorization  The patient is insured through [place], [place] Patient Advocate Specialist [place] Patient Advocate Team Direct Number: [phone]  Fax: [phone]

## 2021-06-20 NOTE — Progress Notes (Signed)
°  Transition of Care [facility]) Screening Note   Patient Details  Name: ORIEN MAYHALL Date of Birth: Sep 29, 1950   Transition [facility]) CM/SW Contact:    Pollie Friar, RN Phone Number: 06/20/2021, 3:04 PM    Transition of Care Department [name]) has reviewed patient. We will continue to monitor patient advancement through interdisciplinary progression rounds. If new patient transition needs arise, please place a TOC consult.

## 2021-06-20 NOTE — Consult Note (Signed)
Hospital Consult    Reason for Consult: Right foot wound with known PAD Referring Physician: Medicine MRN #:  [id number]  History of Present Illness: This is a 71 y.o. male with multiple medical problems admitted with C. difficile that vascular surgery has been consulted for right foot wound.  He is well-known to our practice and previously underwent a right common iliac artery stent on 08/05/2020 by myself.  He then had a right SFA to below-knee popliteal bypass with vein on 08/07/2020 by Dr. Donzetta Matters.  He subsequently has had the bypass relined with stents after it failed and most recently underwent proximal SFA stenting by Dr. Donzetta Matters for a recurrent high grade stenosis.  He missed follow-up in our office recently.  States he has no pain in his foot.  States he is here for diarrhea.  Past Medical History:  Diagnosis Date   Acute combined systolic and diastolic congestive heart failure (HCC)    Acute on chronic heart failure with preserved ejection fraction (HFpEF) (HCC)    Acute respiratory failure with hypoxia (HCC)    Aortic stenosis    moderate in 2022   Atrial fibrillation (HCC)    CHF (congestive heart failure) ([place])    COPD exacerbation ([place]) 12/09/2020   Coronary artery disease    Demand ischemia ([place]) 03/26/2021   Diabetes mellitus without complication (HCC)    HLD (hyperlipidemia)    Hypertension    Long term (current) use of anticoagulants 12/29/2019   Malnutrition of moderate degree 05/09/2021   Peripheral arterial disease ([place])    Pneumonia due to COVID-19 virus 05/29/2021    Past Surgical History:  Procedure Laterality Date   ABDOMINAL AORTOGRAM W/LOWER EXTREMITY N/A 08/05/2020   Procedure: ABDOMINAL AORTOGRAM W/LOWER EXTREMITY;  Surgeon: Marty Heck, MD;  Location: [facility];  Service: Cardiovascular;  Laterality: N/A;   ABDOMINAL AORTOGRAM W/LOWER EXTREMITY N/A 11/13/2020   Procedure: ABDOMINAL AORTOGRAM W/LOWER EXTREMITY;  Surgeon: Cherre Robins, MD;   Location: [facility];  Service: Cardiovascular;  Laterality: N/A;   ABDOMINAL AORTOGRAM W/LOWER EXTREMITY N/A 05/12/2021   Procedure: ABDOMINAL AORTOGRAM W/LOWER EXTREMITY;  Surgeon: Waynetta Sandy, MD;  Location: [facility];  Service: Cardiovascular;  Laterality: N/A;   AMPUTATION Left 09/28/2019   Procedure: AMPUTATION BELOW KNEE;  Surgeon: Newt Minion, MD;  Location: [facility];  Service: Orthopedics;  Laterality: Left;   AMPUTATION Right 11/15/2020   Procedure: RIGHT GREAT TOE AMPUTATION;  Surgeon: Newt Minion, MD;  Location: [facility];  Service: Orthopedics;  Laterality: Right;   CARDIAC CATHETERIZATION     CARDIOVERSION N/A 10/05/2019   Procedure: CARDIOVERSION;  Surgeon: Sanda Klein, MD;  Location: [facility];  Service: Cardiovascular;  Laterality: N/A;   FEMORAL-POPLITEAL BYPASS GRAFT Right 08/07/2020   Procedure: RIGHT FEMORAL TO BELOW KNEE POPLITEAL ARTERY BYPASS;  Surgeon: Waynetta Sandy, MD;  Location: [facility];  Service: Vascular;  Laterality: Right;   LEFT HEART CATH AND CORONARY ANGIOGRAPHY N/A 10/03/2019   Procedure: LEFT HEART CATH AND CORONARY ANGIOGRAPHY;  Surgeon: Lorretta Harp, MD;  Location: [facility];  Service: Cardiovascular;  Laterality: N/A;   PERIPHERAL VASCULAR INTERVENTION Right 08/05/2020   Procedure: PERIPHERAL VASCULAR INTERVENTION;  Surgeon: Marty Heck, MD;  Location: [facility];  Service: Cardiovascular;  Laterality: Right;  common Iliac   PERIPHERAL VASCULAR INTERVENTION Left 11/13/2020   Procedure: PERIPHERAL VASCULAR INTERVENTION;  Surgeon: Cherre Robins, MD;  Location: [facility];  Service:  Cardiovascular;  Laterality: Left;   PERIPHERAL VASCULAR INTERVENTION Right 11/14/2020   Procedure: PERIPHERAL VASCULAR INTERVENTION;  Surgeon: Marty Heck, MD;  Location: [facility];  Service: Cardiovascular;  Laterality: Right;  POP/SFA STENT   PERIPHERAL VASCULAR INTERVENTION   05/12/2021   Procedure: PERIPHERAL VASCULAR INTERVENTION;  Surgeon: Waynetta Sandy, MD;  Location: [facility];  Service: Cardiovascular;;   TEE WITHOUT CARDIOVERSION N/A 10/05/2019   Procedure: TRANSESOPHAGEAL ECHOCARDIOGRAM (TEE);  Surgeon: Sanda Klein, MD;  Location: [facility];  Service: Cardiovascular;  Laterality: N/A;    No Known Allergies  Prior to Admission medications   Medication Sig Start Date End Date Taking? Authorizing Provider  acetaminophen (TYLENOL) 650 MG CR tablet Take 1,300 mg by mouth in the morning and at bedtime.   Yes [provider]  albuterol (VENTOLIN HFA) 108 (90 Base) MCG/ACT inhaler INHALE 2 PUFFS INTO THE LUNGS EVERY 6 (SIX) HOURS AS NEEDED FOR WHEEZING OR SHORTNESS OF BREATH. 04/24/21  Yes Hensel, Jamal Collin, MD  amiodarone (PACERONE) 200 MG tablet Take 1 tablet (200 mg total) by mouth daily. 10/14/20  Yes Hensel, Jamal Collin, MD  atorvastatin (LIPITOR) 80 MG tablet TAKE 1 TABLET EVERY DAY Patient taking differently: Take 80 mg by mouth daily. 03/11/21  Yes O'Neal, Cassie Freer, MD  carvedilol (COREG) 12.5 MG tablet Take 6.25 mg by mouth 2 (two) times daily with a meal. 12/18/20  Yes [provider]  clopidogrel (PLAVIX) 75 MG tablet TAKE 1 TABLET EVERY DAY WITH BREAKFAST Patient taking differently: Take 75 mg by mouth daily. 03/17/21  Yes Hensel, Jamal Collin, MD  ferrous sulfate 325 (65 FE) MG tablet Take 1 tablet (325 mg total) by mouth every other day. Patient taking differently: Take 325 mg by mouth daily with breakfast. 05/18/21  Yes Paige, Victoria J, DO  fluticasone (FLONASE) 50 MCG/ACT nasal spray Place 2 sprays into both nostrils daily. 02/13/21  Yes Simmons-Robinson, Makiera, MD  Fluticasone-Umeclidin-Vilant (TRELEGY ELLIPTA) 100-62.5-25 MCG/ACT AEPB Inhale 1 puff into the lungs daily. 06/01/21  Yes Eppie Gibson, MD  furosemide (LASIX) 40 MG tablet Take 1 tablet (40 mg total) by mouth daily as needed for fluid  (swelling). Patient taking differently: Take 40 mg by mouth daily. 02/13/21  Yes Simmons-Robinson, Makiera, MD  gabapentin (NEURONTIN) 300 MG capsule Take 600 mg by mouth at bedtime.   Yes [provider]  insulin detemir (LEVEMIR) 100 UNIT/ML FlexPen Inject 25 Units into the skin daily. 05/16/21  Yes Paige, Victoria J, DO  lactose free nutrition (BOOST) LIQD Take 237 mLs by mouth 3 (three) times a week.   Yes [provider]  pantoprazole (PROTONIX) 40 MG tablet Take 1 tablet (40 mg total) by mouth 2 (two) times daily. 05/16/21 06/20/21 Yes Paige, Weldon Picking, DO  traZODone (DESYREL) 50 MG tablet Take 0.5-1 tablets (25-50 mg total) by mouth at bedtime as needed for sleep. 05/19/21  Yes Hensel, Jamal Collin, MD  Blood Glucose Monitoring Suppl (TRUE METRIX METER) DEVI Use to test blood sugar three times daily. Patient not taking: Reported on 05/07/2021 11/20/19   [name], MD  Blood Glucose Monitoring Suppl (TRUE METRIX METER) w/Device KIT USE AS DIRECTED Patient not taking: Reported on 05/07/2021 03/25/20   [name], MD  glucose blood (RELION TRUE METRIX TEST STRIPS) test strip Use to test blood sugar three times per day. Patient not taking: Reported on 05/07/2021 11/20/19   [name], MD  Insulin Pen Needle 32G X 4  MM MISC Use to inject insulin up to 4 times daily as directed, 05/16/21     ipratropium-albuterol (DUONEB) 0.5-2.5 (3) MG/3ML SOLN Take 3 mLs by nebulization every 4 (four) hours as needed. 05/19/21   [name], MD  metFORMIN (GLUCOPHAGE) 1000 MG tablet Take 1 tablet (1,000 mg total) by mouth daily. 04/07/21   Lavina Hamman, MD  TRUEplus Lancets 33G MISC Use to test blood sugar three times per day. Patient not taking: Reported on 05/07/2021 11/20/19   [name], MD    Social History   Socioeconomic History   Marital status: Widowed    Spouse name: Not on file   Number of children: Not on file   Years of education: Not on file    Highest education level: Not on file  Occupational History   Not on file  Tobacco Use   Smoking status: Former    Packs/day: 0.50    Years: 50.00    Pack years: 25.00    Types: Cigarettes    Start date: 04/09/2021   Smokeless tobacco: Never  Vaping Use   Vaping Use: Never used  Substance and Sexual Activity   Alcohol use: Yes    Alcohol/week: 6.0 standard drinks    Types: 6 Standard drinks or equivalent per week    Comment: 11/07/20 - states he has not drank in 6 months   Drug use: No   Sexual activity: Yes    Partners: Female    Comment: monagamous stable relationship  Other Topics Concern   Not on file  Social History Narrative   Not on file   Social Determinants of Health   Financial Resource Strain: Not on file  Food Insecurity: Not on file  Transportation Needs: Not on file  Physical Activity: Not on file  Stress: Not on file  Social Connections: Not on file  Intimate Partner Violence: Not on file     Family History  Problem Relation Age of Onset   Alcoholism Mother    Alcoholism Father     ROS: _0  Positive   _1  Negative   _2  All sytems reviewed and are negative  Cardiovascular: _3  chest pain/pressure _4  palpitations _5  SOB lying flat _6  DOE _7  pain in legs while walking _8  pain in legs at rest _9  pain in legs at night _10  non-healing ulcers _11  hx of DVT _12  swelling in legs  Pulmonary: _13  productive cough _14  asthma/wheezing _15  home O2  Neurologic: _16  weakness in _17  arms _18  legs _19  numbness in _20  arms _21  legs _22  hx of CVA _23  mini stroke _24 difficulty speaking or slurred speech _25  temporary loss of vision in one eye _26  dizziness  Hematologic: _27  hx of cancer _28  bleeding problems _29  problems with blood clotting easily  Endocrine:   _30  diabetes _31  thyroid disease  GI _32  vomiting blood _33  blood in stool  GU: _34  CKD/renal failure _35  HD--_36  M/W/F or _37  T/T/S _38  burning with urination _39  blood in urine  Psychiatric: _40  anxiety _41   depression  Musculoskeletal: _42  arthritis _43  joint pain  Integumentary: _44  rashes _45  ulcers  Constitutional: _46  fever _47  chills   Physical Examination  Vitals:   06/20/21 1050 06/20/21 1203  BP:  112/66  Pulse: 72 74  Resp: 18 16  Temp:  98 F (36.7 C)  SpO2:  99%   Body mass index is 36.24 kg/m.  General:  NAD Gait: Not observed HENT: WNL, normocephalic Pulmonary: normal non-labored breathing, without Rales, rhonchi,  wheezing  Cardiac: regular, without  Murmurs, rubs or gallops Abdomen:  soft, NT/ND Vascular Exam/Pulses: Right femoral pulse palpable Hard to appreciate any right pedal pulses but the foot is warm Dry gangrene tip of the right second toe Right great toe amputation previously healed Left BKA Musculoskeletal: no muscle wasting or atrophy  Neurologic: A&O X 3; Appropriate Affect ; SENSATION: normal; MOTOR FUNCTION:  moving all extremities equally. Speech is fluent/normal      CBC    Component Value Date/Time   WBC 10.1 06/20/2021 0457   RBC 3.22 (L) 06/20/2021 0457   HGB 8.8 (L) 06/20/2021 0457   HGB 8.4 (L) 05/19/2021 1200   HCT 28.0 (L) 06/20/2021 0457   HCT 28.2 (L) 05/19/2021 1200   PLT 277 06/20/2021 0457   PLT 255 05/19/2021 1200   MCV 87.0 06/20/2021 0457   MCV 84 05/19/2021 1200   MCH 27.3 06/20/2021 0457   MCHC 31.4 06/20/2021 0457   RDW 19.0 (H) 06/20/2021 0457   RDW 16.2 (H) 05/19/2021 1200   LYMPHSABS 1.1 06/19/2021 1717   MONOABS 0.7 06/19/2021 1717   EOSABS 0.2 06/19/2021 1717   BASOSABS 0.0 06/19/2021 1717    BMET    Component Value Date/Time   NA 135 06/20/2021 0457   NA 141 05/19/2021 1200   K 4.4 06/20/2021 0457   CL 100 06/20/2021 0457   CO2 27 06/20/2021 0457   GLUCOSE 158 (H) 06/20/2021 0457   BUN 28 (H) 06/20/2021 0457   BUN 49 (H) 05/19/2021 1200   CREATININE 1.76 (H) 06/20/2021 0457   CREATININE 1.19 10/09/2015 1554   CALCIUM 7.7 (L) 06/20/2021 0457   GFRNONAA 41 (L) 06/20/2021 0457   GFRNONAA 65  10/09/2015 1554   GFRAA 52 (L) 05/01/2020 1632   GFRAA 75 10/09/2015 1554    COAGS: Lab Results  Component Value Date   INR 1.2 05/14/2021   INR 1.2 05/13/2021   INR 1.1 05/12/2021     Non-Invasive Vascular Imaging:    Right lower extremity arterial duplex pending   ASSESSMENT/PLAN: This is a 71 y.o. male admitted with C. difficile that vascular surgery has been consulted for right lower extremity wound.  He is well-known to our practice and most recently underwent stent of the right SFA to below-knee popliteal bypass on 05/12/2021 by Dr. Donzetta Matters.  He missed follow-up in our office recently.  Discussed I will order right leg arterial duplex for his surveillance image that he missed in the office.  I discussed the option of a right second toe amputation where he has dry gangrene as pictured above and he is against any toe amputation and has previously been against any further amputation.  The right second toe gangrene is dry.  He also has desquamated skin on the dorsum of the foot with a developing wound.  He would prefer wound care and observation for now.  Vascular will follow.  Marty Heck, MD [facility] Office: [place]

## 2021-06-20 NOTE — TOC Benefit Eligibility Note (Signed)
Patient Teacher, English as a foreign language completed.    The patient is currently admitted and upon discharge could be taking Dificid 200 mg tablets.  The current 30 day co-pay is, $1,576.76.   The patient is insured through [employer], [place] Patient Advocate Specialist [place] Patient Advocate Team Direct Number: [phone]  Fax: [phone]

## 2021-06-20 NOTE — Plan of Care (Signed)
°  Problem: Education: Goal: Knowledge of disease or condition will improve Outcome: Progressing Goal: Knowledge of the prescribed therapeutic regimen will improve Outcome: Progressing Goal: Individualized Educational Video(s) Outcome: Not Applicable   Problem: Respiratory: Goal: Ability to maintain a clear airway will improve Outcome: Progressing Goal: Ability to maintain adequate ventilation will improve Outcome: Progressing

## 2021-06-20 NOTE — Hospital Course (Addendum)
Rodney [name] is a 71 y.o. male presenting with multiple weeks of nonbloody diarrhea. PMH is significant for T2DM, obesity, tobacco abuse, hypertension, protein calorie malnutrition, aortic stenosis, anemia of chronic disease, PVD, HFrEF, PAF, s/p BKA, CAD, CKD stage III, COPD.  C. Difficile Colitis Patient presented with 2 weeks of persistent copious diarrhea. Stool sample positive for C. Diff toxin and antigen. Patient was started on 10 day course of Fidaxomicin starting on 06/20/21 and completed the course Patient received fluid boluses while admitted to assist with maintaining fluid status. On the second day of oral fidaxomicin, his diarrhea was markedly improved.   Acute Hypoxemic Respiratory Failure  Pt had acute episode of respiratory on 2/27 and required BiPAP for a short period of time. STAT CXR showed increased pulmonary edema and he was treated with IV Lasix to which he responded swiftly and was transitioned back to supplemental O2 via nasal cannula. Echocardiogram was obtained which showed intact LVEF, indeterminate diastolic function, and IVC with <50% respiratory variability suggesting fluid overload. We therefore dosed him with more IV lasix and he had a He was transitioned to home Lasix 40mg  BID with good urine output. By time of discharge, he was breathing comfortably on baseline oxygen 2.5L [place].  Right foot wounds   concern for cellulitis versus erysipelas PVD   CAD   s/p L BKA Patient has chronic right foot wounds and is s/p SFA stenting in January of this year. He recently missed an appointment with [facility] due to weakness/diarrhea from his C. Diff. We therefore consulted them during this hospitalization. They offered amputation of his gangrenous second R toe which he adamantly declined. ABIs and Duplex US showed patency of his SFA stent. No further intervention by [facility].   Anemia of chronic disease Patient has a known history of anemia and his hemoglobin hovered around his transfusion  threshold of 8.0 for most of the hospitalization. Pt required total 3u transfusion during hospitalization with discharge Hgb 8.4.  Persistent left pleural effusion First noted during his last hospitalization. Appearance of effusion was stable on CXR this admission. We discussed the possibility of having this drained via thoracentesis, but doing so would require a Plavix washout which would increase the risk of complications with his SFA stent. We therefore elected to manage this expectantly.    Other chronic conditions managed appropriately and stable.   Items for Follow Up:  Recommend repeat CBC Resume iron supplementation  Recommend repeat CXR in 3-4 months to ensure resolution of pleural effusion Follow up with [facility] in 1 month

## 2021-06-20 NOTE — TOC Benefit Eligibility Note (Signed)
Patient Advocate Encounter  Patient is approved through the [employer] Patient Assistance Program for Dificid through 04/26/2022.   Medication will be mailed to Patient's [place], [place] Patient Advocate Specialist [place] Patient Advocate Team Direct Number: [phone]  Fax: [phone]

## 2021-06-21 ENCOUNTER — Inpatient Hospital Stay (HOSPITAL_COMMUNITY): Payer: Medicare PPO

## 2021-06-21 DIAGNOSIS — J90 Pleural effusion, not elsewhere classified: Secondary | ICD-10-CM

## 2021-06-21 DIAGNOSIS — A0472 Enterocolitis due to Clostridium difficile, not specified as recurrent: Secondary | ICD-10-CM | POA: Diagnosis not present

## 2021-06-21 DIAGNOSIS — N179 Acute kidney failure, unspecified: Secondary | ICD-10-CM | POA: Diagnosis not present

## 2021-06-21 DIAGNOSIS — I350 Nonrheumatic aortic (valve) stenosis: Secondary | ICD-10-CM

## 2021-06-21 DIAGNOSIS — E440 Moderate protein-calorie malnutrition: Secondary | ICD-10-CM

## 2021-06-21 DIAGNOSIS — N189 Chronic kidney disease, unspecified: Secondary | ICD-10-CM

## 2021-06-21 DIAGNOSIS — I739 Peripheral vascular disease, unspecified: Secondary | ICD-10-CM

## 2021-06-21 DIAGNOSIS — I480 Paroxysmal atrial fibrillation: Secondary | ICD-10-CM

## 2021-06-21 DIAGNOSIS — Z720 Tobacco use: Secondary | ICD-10-CM

## 2021-06-21 DIAGNOSIS — E8809 Other disorders of plasma-protein metabolism, not elsewhere classified: Secondary | ICD-10-CM

## 2021-06-21 DIAGNOSIS — Z89512 Acquired absence of left leg below knee: Secondary | ICD-10-CM

## 2021-06-21 DIAGNOSIS — I10 Essential (primary) hypertension: Secondary | ICD-10-CM

## 2021-06-21 DIAGNOSIS — D638 Anemia in other chronic diseases classified elsewhere: Secondary | ICD-10-CM

## 2021-06-21 DIAGNOSIS — I96 Gangrene, not elsewhere classified: Secondary | ICD-10-CM | POA: Diagnosis not present

## 2021-06-21 DIAGNOSIS — E1149 Type 2 diabetes mellitus with other diabetic neurological complication: Secondary | ICD-10-CM

## 2021-06-21 LAB — BASIC METABOLIC PANEL
Anion gap: 5 (ref 5–15)
BUN: 27 mg/dL — ABNORMAL HIGH (ref 8–23)
CO2: 30 mmol/L (ref 22–32)
Calcium: 7.8 mg/dL — ABNORMAL LOW (ref 8.9–10.3)
Chloride: 101 mmol/L (ref 98–111)
Creatinine, Ser: 1.62 mg/dL — ABNORMAL HIGH (ref 0.61–1.24)
GFR, Estimated: 46 mL/min — ABNORMAL LOW (ref >60)
Glucose, Bld: 140 mg/dL — ABNORMAL HIGH (ref 70–99)
Potassium: 4.7 mmol/L (ref 3.5–5.1)
Sodium: 136 mmol/L (ref 135–145)

## 2021-06-21 LAB — CBC
HCT: 27 % — ABNORMAL LOW (ref 39.0–52.0)
Hemoglobin: 8.3 g/dL — ABNORMAL LOW (ref 13.0–17.0)
MCH: 26.9 pg (ref 26.0–34.0)
MCHC: 30.7 g/dL (ref 30.0–36.0)
MCV: 87.4 fL (ref 80.0–100.0)
Platelets: 275 10*3/uL (ref 150–400)
RBC: 3.09 MIL/uL — ABNORMAL LOW (ref 4.22–5.81)
RDW: 18.8 % — ABNORMAL HIGH (ref 11.5–15.5)
WBC: 7.7 10*3/uL (ref 4.0–10.5)
nRBC: 0 % (ref 0.0–0.2)

## 2021-06-21 LAB — GLUCOSE, CAPILLARY
Glucose-Capillary: 121 mg/dL — ABNORMAL HIGH (ref 70–99)
Glucose-Capillary: 136 mg/dL — ABNORMAL HIGH (ref 70–99)
Glucose-Capillary: 123 mg/dL — ABNORMAL HIGH (ref 70–99)

## 2021-06-21 LAB — TSH: TSH: 3.936 u[IU]/mL (ref 0.350–4.500)

## 2021-06-21 MED ORDER — SALINE SPRAY 0.65 % NA SOLN
1.0000 | NASAL | Status: DC | PRN
  Filled 2021-06-21: qty 44

## 2021-06-21 NOTE — Progress Notes (Signed)
FPTS Brief Progress Note  S Saw patient at bedside this evening. Patient was laying in bed comfortably. 4-5 episodes of diarrhea today. Normal PO intake and feels well.  O: BP 120/68 (BP Location: Right Arm)    Pulse 72    Temp 98.1 F (36.7 C) (Oral)    Resp 17    Ht 6' (1.829 m)    Wt 121.9 kg    SpO2 95%    BMI 36.45 kg/m    General: Alert, no acute distress Cardio: well perfused  Pulm: normal work of breathing Neuro: Cranial nerves grossly intact   A/P: Plan per day team  - Orders reviewed. Labs for AM ordered, which was adjusted as needed.  - If condition changes, plan includes bedside evaluation.   Lattie Haw, MD 06/21/2021, 7:48 PM PGY-3, [place] [facility] Night Resident  Please page [phone] with questions.

## 2021-06-21 NOTE — Progress Notes (Signed)
Family Medicine Teaching Service Daily Progress Note Intern Pager: [phone]  Patient name: Rodney [name] Medical record number: [id number] Date of birth: 1951/02/26 Age: 71 y.o. Gender: male  Primary Care Provider: [name], MD Consultants: None Code Status: Full  Pt Overview and Major Events to Date:  2/24 - Admitted  Assessment and Plan: Rodney [name] is a 71 y.o. male presenting with multiple weeks of nonbloody nonwatery diarrhea. PMH is significant for type 2 diabetes, obesity, tobacco abuse, hypertension, protein calorie malnutrition, aortic stenosis, anemia of chronic disease, PVD, HFrEF, PAF, s/p BKA, CAD, CKD stage III, COPD  C. difficile colitis Remains afebrile, VSS. Not obviously hypovolemic on examination. Had only 1 BM overnight - Oral fidaxomicin (day 2/10) - Encourage oral intake  Right leg wounds and dry gangrene   PVD   CAD   s/p left BKA Seen by vascular, still refusing amputation and would like to focus on wound care. - Vascular consulted, obtaining arterial duplex - Continue home Plavix and atorvastatin 80 mg daily - Continue wound care - No antibiotics at this time  AKI on CKD stage IIIa Creatinine slowly improving (1.76>1.62). - Encourage PO intake - Renally adjust medications - Avoid nephrotoxic medications  Persistent pleural effusion Repeat CXR on 2/24 with stable left pleural effusion.  Respiratory status remained stable. - If worsening respiratory status consider thoracentesis or pulm consult  Anemia of chronic disease Hgb stable at 9.2. - Holding home iron  COPD At baseline with as needed 2 L O2 - Continue home DuoNebs every 4 as needed - Breo and Incruse  HFpEF  Appears euvolemic at this time, holding Lasix due to significant GI output. - Continue home Coreg 6.5 mg twice daily - Holding home Lasix in setting of hypovolemia  Paroxysmal A-fib - Continue home amiodarone 200 mg daily  T2DM CBGs 109-145, received 2 Units  of SSI. - Continue gabapentin 600 mg nightly - Semglee 13 units daily - Sensitive SSI - CBGs before every meal and nightly - Holding metformin (current AKI)  GERD - Protonix 40 mg twice daily  Insomnia - Trazodone 25-50 mg nightly  Tobacco use - Nicotine patch   FEN/GI: Carb modified PPx: Lovenox Dispo:SNF in 2-3 days. Barriers include clinical improvement.   Subjective:  Patient reports no concerns or problems at this time, he states he has not had any further diarrhea since admission. Nurse reports he had one BM overnight that was mushy but not watery.   Objective: Temp:  [98 F (36.7 C)-98.6 F (37 C)] 98.3 F (36.8 C) (02/25 0123) Pulse Rate:  [71-79] 71 (02/25 0123) Resp:  [14-23] 18 (02/25 0123) BP: (101-124)/(52-69) 124/69 (02/25 0123) SpO2:  [98 %-100 %] 99 % (02/25 0123) FiO2 (%):  [28 %] 28 % (02/24 1050) Physical Exam: General: Chronically ill-appearing elderly male Cardiovascular: RRR, systolic murmur Respiratory: No increased WOB, [place] not in place and patient breathing comfortably Abdomen: Soft, nontender, nondistended Extremities: RLE with area of crusting and tense bulla, dry gangrene of right second toe  Laboratory: Recent Labs  Lab 06/19/21 1717 06/20/21 0457 06/21/21 0155  WBC 11.7* 10.1 7.7  HGB 9.2* 8.8* 8.3*  HCT 29.7* 28.0* 27.0*  PLT 269 277 275   Recent Labs  Lab 06/19/21 1717 06/20/21 0457 06/21/21 0155  NA 133* 135 136  K 4.4 4.4 4.7  CL 98 100 101  CO2 28 27 30   BUN 27* 28* 27*  CREATININE 1.88* 1.76* 1.62*  CALCIUM 7.6* 7.7* 7.8*  PROT 5.1*  --   --  BILITOT 0.5  --   --   ALKPHOS 82  --   --   ALT 25  --   --   AST 18  --   --   GLUCOSE 106* 158* 140*      Imaging/Diagnostic Tests: DG CHEST PORT 1 VIEW  Result Date: 06/20/2021 CLINICAL DATA:  71 year old male with history of COVID pneumonia, shortness of breath. EXAM: PORTABLE CHEST - 1 VIEW COMPARISON:  05/29/2021 FINDINGS: The mediastinal contours are within  normal limits, partially obscured. Unchanged cardiomegaly. Atherosclerotic calcification of the aortic arch persistent small left pleural effusion. Improved aeration of the left lung with persistent basilar opacities. Faint right basilar pulmonary opacities. No acute osseous abnormality. IMPRESSION: 1. Persistent small left pleural effusion with left basal and lingular pulmonary opacities, likely secondary to recent pneumonia. 2. Right basilar subsegmental atelectasis. Electronically Signed   By: Ruthann Cancer M.D.   On: 06/20/2021 16:37     Rise Patience, DO 06/21/2021, 5:31 AM PGY-2, [place] Intern pager: [phone], text pages welcome

## 2021-06-21 NOTE — Progress Notes (Signed)
[facility]   Subjective  -he is against any further toe amputations of his right foot   Objective 123/66 72 98.1 F (36.7 C) (Oral) 17 93%  Intake/Output Summary (Last 24 hours) at 06/21/2021 1217 Last data filed at 06/21/2021 0814 Gross per 24 hour  Intake 440 ml  Output 1275 ml  Net -835 ml       Laboratory Lab Results: Recent Labs    06/20/21 0457 06/21/21 0155  WBC 10.1 7.7  HGB 8.8* 8.3*  HCT 28.0* 27.0*  PLT 277 275   BMET Recent Labs    06/20/21 0457 06/21/21 0155  NA 135 136  K 4.4 4.7  CL 100 101  CO2 27 30  GLUCOSE 158* 140*  BUN 28* 27*  CREATININE 1.76* 1.62*  CALCIUM 7.7* 7.8*    COAG Lab Results  Component Value Date   INR 1.2 05/14/2021   INR 1.2 05/13/2021   INR 1.1 05/12/2021   No results found for: PTT  Assessment/Planning:  71 year old male admitted with C. difficile colitis that vascular surgery was consulted yesterday given known chronic PAD of the right lower extremity with multiple interventions including bypass and endovascular stenting.  He is against any further toe amputations of the right foot.  He has dry gangrene of the right second toe and would recommend Betadine paint to this.  I will follow-up right leg arterial duplex and ABIs today to ensure his most recent endovascular intervention remains patent.  Marty Heck 06/21/2021 12:17 PM --

## 2021-06-21 NOTE — Progress Notes (Signed)
FPTS Brief Progress Note  S: Patient sleeping soundly   O: BP 124/69 (BP Location: Right Arm)    Pulse 71    Temp 98.3 F (36.8 C) (Oral)    Resp 18    Ht 6' (1.829 m)    Wt 121.2 kg    SpO2 99%    BMI 36.24 kg/m   General: Patient sleeping, NAD Respiratory: Patient breathing comfortably on room air  A/P: C. difficile colitis -Continue oral fidaxomicin  T2DM P.m. glucose of 130 -Continue to follow plan as outlined in day teams progress note  Right leg wounds with PVD Vascular was consulted earlier, patient declines amputation. -Continue wound care and to follow plan as outlined in day team's progress note  Precious Gilding, DO 06/21/2021, 2:04 AM PGY-1, [facility] Night Resident  Please page [phone] with questions.

## 2021-06-22 ENCOUNTER — Inpatient Hospital Stay (HOSPITAL_COMMUNITY): Payer: Medicare PPO

## 2021-06-22 DIAGNOSIS — I739 Peripheral vascular disease, unspecified: Secondary | ICD-10-CM

## 2021-06-22 DIAGNOSIS — E1149 Type 2 diabetes mellitus with other diabetic neurological complication: Secondary | ICD-10-CM | POA: Diagnosis not present

## 2021-06-22 DIAGNOSIS — L0390 Cellulitis, unspecified: Secondary | ICD-10-CM

## 2021-06-22 DIAGNOSIS — N179 Acute kidney failure, unspecified: Secondary | ICD-10-CM | POA: Diagnosis not present

## 2021-06-22 DIAGNOSIS — A0472 Enterocolitis due to Clostridium difficile, not specified as recurrent: Secondary | ICD-10-CM | POA: Diagnosis not present

## 2021-06-22 DIAGNOSIS — I96 Gangrene, not elsewhere classified: Secondary | ICD-10-CM | POA: Diagnosis not present

## 2021-06-22 LAB — CBC
HCT: 24.7 % — ABNORMAL LOW (ref 39.0–52.0)
Hemoglobin: 7.7 g/dL — ABNORMAL LOW (ref 13.0–17.0)
MCH: 27.2 pg (ref 26.0–34.0)
MCHC: 31.2 g/dL (ref 30.0–36.0)
MCV: 87.3 fL (ref 80.0–100.0)
Platelets: 274 10*3/uL (ref 150–400)
RBC: 2.83 MIL/uL — ABNORMAL LOW (ref 4.22–5.81)
RDW: 18.8 % — ABNORMAL HIGH (ref 11.5–15.5)
WBC: 6 10*3/uL (ref 4.0–10.5)
nRBC: 0 % (ref 0.0–0.2)

## 2021-06-22 LAB — GLUCOSE, CAPILLARY
Glucose-Capillary: 180 mg/dL — ABNORMAL HIGH (ref 70–99)
Glucose-Capillary: 161 mg/dL — ABNORMAL HIGH (ref 70–99)
Glucose-Capillary: 178 mg/dL — ABNORMAL HIGH (ref 70–99)
Glucose-Capillary: 171 mg/dL — ABNORMAL HIGH (ref 70–99)
Glucose-Capillary: 166 mg/dL — ABNORMAL HIGH (ref 70–99)

## 2021-06-22 LAB — BASIC METABOLIC PANEL
Anion gap: 6 (ref 5–15)
BUN: 29 mg/dL — ABNORMAL HIGH (ref 8–23)
CO2: 27 mmol/L (ref 22–32)
Calcium: 7.6 mg/dL — ABNORMAL LOW (ref 8.9–10.3)
Chloride: 101 mmol/L (ref 98–111)
Creatinine, Ser: 1.65 mg/dL — ABNORMAL HIGH (ref 0.61–1.24)
GFR, Estimated: 45 mL/min — ABNORMAL LOW (ref >60)
Glucose, Bld: 178 mg/dL — ABNORMAL HIGH (ref 70–99)
Potassium: 4.4 mmol/L (ref 3.5–5.1)
Sodium: 134 mmol/L — ABNORMAL LOW (ref 135–145)

## 2021-06-22 LAB — HEMOGLOBIN AND HEMATOCRIT, BLOOD
HCT: 26.8 % — ABNORMAL LOW (ref 39.0–52.0)
Hemoglobin: 8.2 g/dL — ABNORMAL LOW (ref 13.0–17.0)

## 2021-06-22 MED ORDER — ALBUTEROL SULFATE (2.5 MG/3ML) 0.083% IN NEBU
2.5000 mg | INHALATION_SOLUTION | RESPIRATORY_TRACT | Status: DC | PRN
  Administered 2021-06-24 – 2021-06-29 (×7): 2.5 mg via RESPIRATORY_TRACT
  Filled 2021-06-22 (×7): qty 3

## 2021-06-22 MED ORDER — FUROSEMIDE 40 MG PO TABS
40.0000 mg | ORAL_TABLET | Freq: Every day | ORAL | Status: DC
  Administered 2021-06-22: 40 mg via ORAL
  Filled 2021-06-22: qty 1

## 2021-06-22 NOTE — Progress Notes (Signed)
Family Medicine Teaching Service Daily Progress Note Intern Pager: [phone]  Patient name: Rodney [name] Medical record number: [id number] Date of birth: [birth year] Age: 71 y.o. Gender: male  Primary Care Provider: [name], MD Consultants: None Code Status: Full  Pt Overview and Major Events to Date:  2/24- Admitted  Assessment and Plan: KERON NEENAN is a 71 y.o. male presenting with multiple weeks of nonbloody nonwatery diarrhea found to be C Diff positive. PMH is significant for type 2 diabetes, obesity, tobacco abuse, hypertension, protein calorie malnutrition, aortic stenosis, anemia of chronic disease, PVD, HFrEF, PAF, s/p BKA, CAD, CKD stage III, COPD  C Difficile Colitis Patient remains afebrile, diarrhea is markedly improved. WBC wnl at 6.0. Euvolemic today. He reports that diarrhea is unchanged in character but is becoming less frequent.  - Continue oral fidaxomicin (day 3/10) - POAL  PVD   R leg Wounds   R Second Toe dry gangrene   CAD   S/p L BKA Vascular surgery following, recommend ABIs and duplex US to ensure that recent revascularization remains patent. Pt continues to adamantly refuse amputation of gangrenous second toe.  - f/u Duplex, ABI - Vascular following, appreciate recs - Continue Plavix and Statin - WOC following - PT/OT  Anemia of chronic disease Hemoglobin 8.3 > 7.7.  Transfusion threshold was 8.0 due to CAD/PAD. No evidence of ongoing bleeding. -Hold Lovenox -Stat H&H -If still below 8, will transfuse 1 unit PRBCs. Pt consented for blood in room.   AKI on CKD IIIa Cr stable at 1.62>1.65. BL around 1.3. - Continue to monitor  - POAL  Persistent left pleural effusion COPD O2 requirement up to 3 L today from 2 L previously.  Consider that fluid overload from HFpEF in the setting of holding his Lasix may account for his increased O2 requirement. -Restart home Lasix -If worsening respiratory status, consider thoracentesis -Continue  DuoNebs every 4 hours as needed -Breo and Incruse  HFpEF Had been holding his Lasix due to dehydration on admission secondary to GI losses.  Now that diarrhea has improved, consider that fluid overload could be affecting his respiratory status. -Continue home Coreg 6.5 mg twice daily -Restart home Lasix  T2DM Glucose 123-178.  Received 13 u LAI, 2 u SAI. -Continue Levemir 13 units daily -CBGs, sSSI  Chronic, stable conditions Paroxysmal A-fib-amiodarone 200 mg daily GERD-Protonix 40mg  twice daily Insomnia-trazodone 25-50 mg nightly Tobacco use-nicotine patch  FEN/GI: Carb modified PPx: SCDs Dispo:Pending PT recommendations  tomorrow. Barriers include vascular imaging and PT consult.   Subjective:  Rodney [name] reports feeling "same-old same-old" this morning.  His diarrhea is becoming less frequent but he still is dealing with tenesmus and very loose stools.  He reports a little bit of increased shortness of breath this morning compared to previous.  He did request a DuoNeb treatment at this morning that he does not feel was helpful.  Objective: Temp:  [98 F (36.7 C)-98.5 F (36.9 C)] 98.3 F (36.8 C) (02/26 0448) Pulse Rate:  [70-72] 70 (02/26 0448) Resp:  [16-20] 18 (02/26 0448) BP: (107-123)/(58-78) 119/78 (02/26 0448) SpO2:  [91 %-100 %] 100 % (02/26 0722) FiO2 (%):  [28 %-32 %] 32 % (02/26 0722) Physical Exam: General: Sleeping on arrival, awoke easily to voice, NAD Cardiovascular: Regular rate, regular rhythm, systolic murmur stable from my previous exams Respiratory: Normal work of breathing on 3 L, coarse lung sounds throughout Abdomen: Obese, nontender, nondistended Extremities: Right lower extremity with chronic stasis changes and necrosis of  the second toe, stable from my previous exams  Laboratory: Recent Labs  Lab 06/20/21 0457 06/21/21 0155 06/22/21 0035  WBC 10.1 7.7 6.0  HGB 8.8* 8.3* 7.7*  HCT 28.0* 27.0* 24.7*  PLT 277 275 274   Recent Labs   Lab 06/19/21 1717 06/20/21 0457 06/21/21 0155 06/22/21 0035  NA 133* 135 136 134*  K 4.4 4.4 4.7 4.4  CL 98 100 101 101  CO2 28 27 30 27   BUN 27* 28* 27* 29*  CREATININE 1.88* 1.76* 1.62* 1.65*  CALCIUM 7.6* 7.7* 7.8* 7.6*  PROT 5.1*  --   --   --   BILITOT 0.5  --   --   --   ALKPHOS 82  --   --   --   ALT 25  --   --   --   AST 18  --   --   --   GLUCOSE 106* 158* 140* 178*    Imaging/Diagnostic Tests: Awaiting RLE duplex and ABIs  Eppie Gibson, MD 06/22/2021, 7:43 AM PGY-1, [facility] Intern pager: [phone], text pages welcome

## 2021-06-22 NOTE — Progress Notes (Signed)
VASCULAR LAB    Right lower extremity arterial duplex has been performed.  See CV proc for preliminary results.   Leldon Steege, RVT 06/22/2021, 1:58 PM

## 2021-06-22 NOTE — Progress Notes (Signed)
Notified RT that patient is requesting a breathing treatment.

## 2021-06-22 NOTE — Progress Notes (Signed)
[facility]  Subjective  -he is still against any further toe amputations of his right foot   Objective 123/64 78 97.9 F (36.6 C) (Oral) 17 94%  Intake/Output Summary (Last 24 hours) at 06/22/2021 1031 Last data filed at 06/21/2021 2000 Gross per 24 hour  Intake 480 ml  Output 450 ml  Net 30 ml      Dry gangrene to tip of right 2nd toe  Laboratory Lab Results: Recent Labs    06/21/21 0155 06/22/21 0035 06/22/21 0806  WBC 7.7 6.0  --   HGB 8.3* 7.7* 8.2*  HCT 27.0* 24.7* 26.8*  PLT 275 274  --    BMET Recent Labs    06/21/21 0155 06/22/21 0035  NA 136 134*  K 4.7 4.4  CL 101 101  CO2 30 27  GLUCOSE 140* 178*  BUN 27* 29*  CREATININE 1.62* 1.65*  CALCIUM 7.8* 7.6*    COAG Lab Results  Component Value Date   INR 1.2 05/14/2021   INR 1.2 05/13/2021   INR 1.1 05/12/2021   No results found for: PTT  Assessment/Planning:  71 year old male admitted with C. difficile colitis that vascular surgery was consulted Friday given known chronic PAD of the right lower extremity with multiple interventions including bypass and endovascular stenting.  He is against any further toe amputations of the right foot and still feels that way today.  He has dry gangrene of the right second toe and would recommend Betadine paint to this.  Still awaiting right leg arterial duplex and ABIs which have been ordered to ensure his most recent endovascular intervention remains patent.  Marty Heck 06/22/2021 10:31 AM --

## 2021-06-22 NOTE — Progress Notes (Signed)
VASCULAR LAB    ABIs have been performed.  See CV proc for preliminary results.   Karel Turpen, RVT 06/22/2021, 1:59 PM

## 2021-06-22 NOTE — Progress Notes (Addendum)
FPTS Brief Progress Note  S:Patient sleeping, did not disturb   O: BP 113/69 (BP Location: Right Arm)    Pulse 69    Temp 97.6 F (36.4 C) (Oral)    Resp 20    Ht 6' (1.829 m)    Wt 121.9 kg    SpO2 98%    BMI 36.45 kg/m   General: NAD, sleeping in bed Respiratory: breathing comfortably on room air  A/P: C. Diff colitis - Orders reviewed. Labs for AM ordered, which was adjusted as needed.  - Continuing oral fidaxomicin  - Hopeful for discharge 2/27 if appropriate  Rise Patience, DO 06/23/2021, 12:03 AM PGY-2, [facility] Night Resident  Please page [phone] with questions.

## 2021-06-23 ENCOUNTER — Inpatient Hospital Stay (HOSPITAL_COMMUNITY): Payer: Medicare PPO

## 2021-06-23 DIAGNOSIS — I5033 Acute on chronic diastolic (congestive) heart failure: Secondary | ICD-10-CM

## 2021-06-23 DIAGNOSIS — I96 Gangrene, not elsewhere classified: Secondary | ICD-10-CM

## 2021-06-23 DIAGNOSIS — A0472 Enterocolitis due to Clostridium difficile, not specified as recurrent: Secondary | ICD-10-CM | POA: Diagnosis not present

## 2021-06-23 DIAGNOSIS — J9611 Chronic respiratory failure with hypoxia: Secondary | ICD-10-CM | POA: Diagnosis not present

## 2021-06-23 DIAGNOSIS — I5020 Unspecified systolic (congestive) heart failure: Secondary | ICD-10-CM

## 2021-06-23 LAB — CBC
HCT: 25.5 % — ABNORMAL LOW (ref 39.0–52.0)
Hemoglobin: 7.9 g/dL — ABNORMAL LOW (ref 13.0–17.0)
MCH: 27.1 pg (ref 26.0–34.0)
MCHC: 31 g/dL (ref 30.0–36.0)
MCV: 87.3 fL (ref 80.0–100.0)
Platelets: 291 10*3/uL (ref 150–400)
RBC: 2.92 MIL/uL — ABNORMAL LOW (ref 4.22–5.81)
RDW: 18.6 % — ABNORMAL HIGH (ref 11.5–15.5)
WBC: 5.5 10*3/uL (ref 4.0–10.5)
nRBC: 0 % (ref 0.0–0.2)

## 2021-06-23 LAB — BASIC METABOLIC PANEL
Anion gap: 7 (ref 5–15)
Anion gap: 6 (ref 5–15)
BUN: 26 mg/dL — ABNORMAL HIGH (ref 8–23)
BUN: 27 mg/dL — ABNORMAL HIGH (ref 8–23)
CO2: 27 mmol/L (ref 22–32)
CO2: 28 mmol/L (ref 22–32)
Calcium: 8 mg/dL — ABNORMAL LOW (ref 8.9–10.3)
Calcium: 8 mg/dL — ABNORMAL LOW (ref 8.9–10.3)
Chloride: 101 mmol/L (ref 98–111)
Chloride: 100 mmol/L (ref 98–111)
Creatinine, Ser: 1.47 mg/dL — ABNORMAL HIGH (ref 0.61–1.24)
Creatinine, Ser: 1.44 mg/dL — ABNORMAL HIGH (ref 0.61–1.24)
GFR, Estimated: 51 mL/min — ABNORMAL LOW (ref >60)
GFR, Estimated: 53 mL/min — ABNORMAL LOW (ref >60)
Glucose, Bld: 163 mg/dL — ABNORMAL HIGH (ref 70–99)
Glucose, Bld: 236 mg/dL — ABNORMAL HIGH (ref 70–99)
Potassium: 4.7 mmol/L (ref 3.5–5.1)
Potassium: 4.6 mmol/L (ref 3.5–5.1)
Sodium: 135 mmol/L (ref 135–145)
Sodium: 134 mmol/L — ABNORMAL LOW (ref 135–145)

## 2021-06-23 LAB — ECHOCARDIOGRAM COMPLETE
AR max vel: 0.82 cm2
AV Area VTI: 0.82 cm2
AV Area mean vel: 0.79 cm2
AV Mean grad: 43 mmHg
AV Peak grad: 74 mmHg
Ao pk vel: 4.3 m/s
Area-P 1/2: 5.38 cm2
Calc EF: 56.3 %
Height: 72 in
MV M vel: 5.52 m/s
MV Peak grad: 121.9 mmHg
Radius: 0.2 cm
S' Lateral: 3.2 cm
Single Plane A2C EF: 51.5 %
Single Plane A4C EF: 60 %
Weight: 4324.54 oz

## 2021-06-23 LAB — BRAIN NATRIURETIC PEPTIDE: B Natriuretic Peptide: 490.1 pg/mL — ABNORMAL HIGH (ref 0.0–100.0)

## 2021-06-23 LAB — GLUCOSE, CAPILLARY
Glucose-Capillary: 151 mg/dL — ABNORMAL HIGH (ref 70–99)
Glucose-Capillary: 164 mg/dL — ABNORMAL HIGH (ref 70–99)
Glucose-Capillary: 185 mg/dL — ABNORMAL HIGH (ref 70–99)
Glucose-Capillary: 225 mg/dL — ABNORMAL HIGH (ref 70–99)

## 2021-06-23 LAB — HEMOGLOBIN AND HEMATOCRIT, BLOOD
HCT: 26.3 % — ABNORMAL LOW (ref 39.0–52.0)
Hemoglobin: 8.1 g/dL — ABNORMAL LOW (ref 13.0–17.0)

## 2021-06-23 IMAGING — DX DG CHEST 1V PORT
2 series · 2 of 2 positions shown · non-contrast
Comparison: [DATE]

CLINICAL DATA: Increased shortness of breath.

EXAM:
PORTABLE CHEST 1 VIEW

[chest ap (1 of 2)]
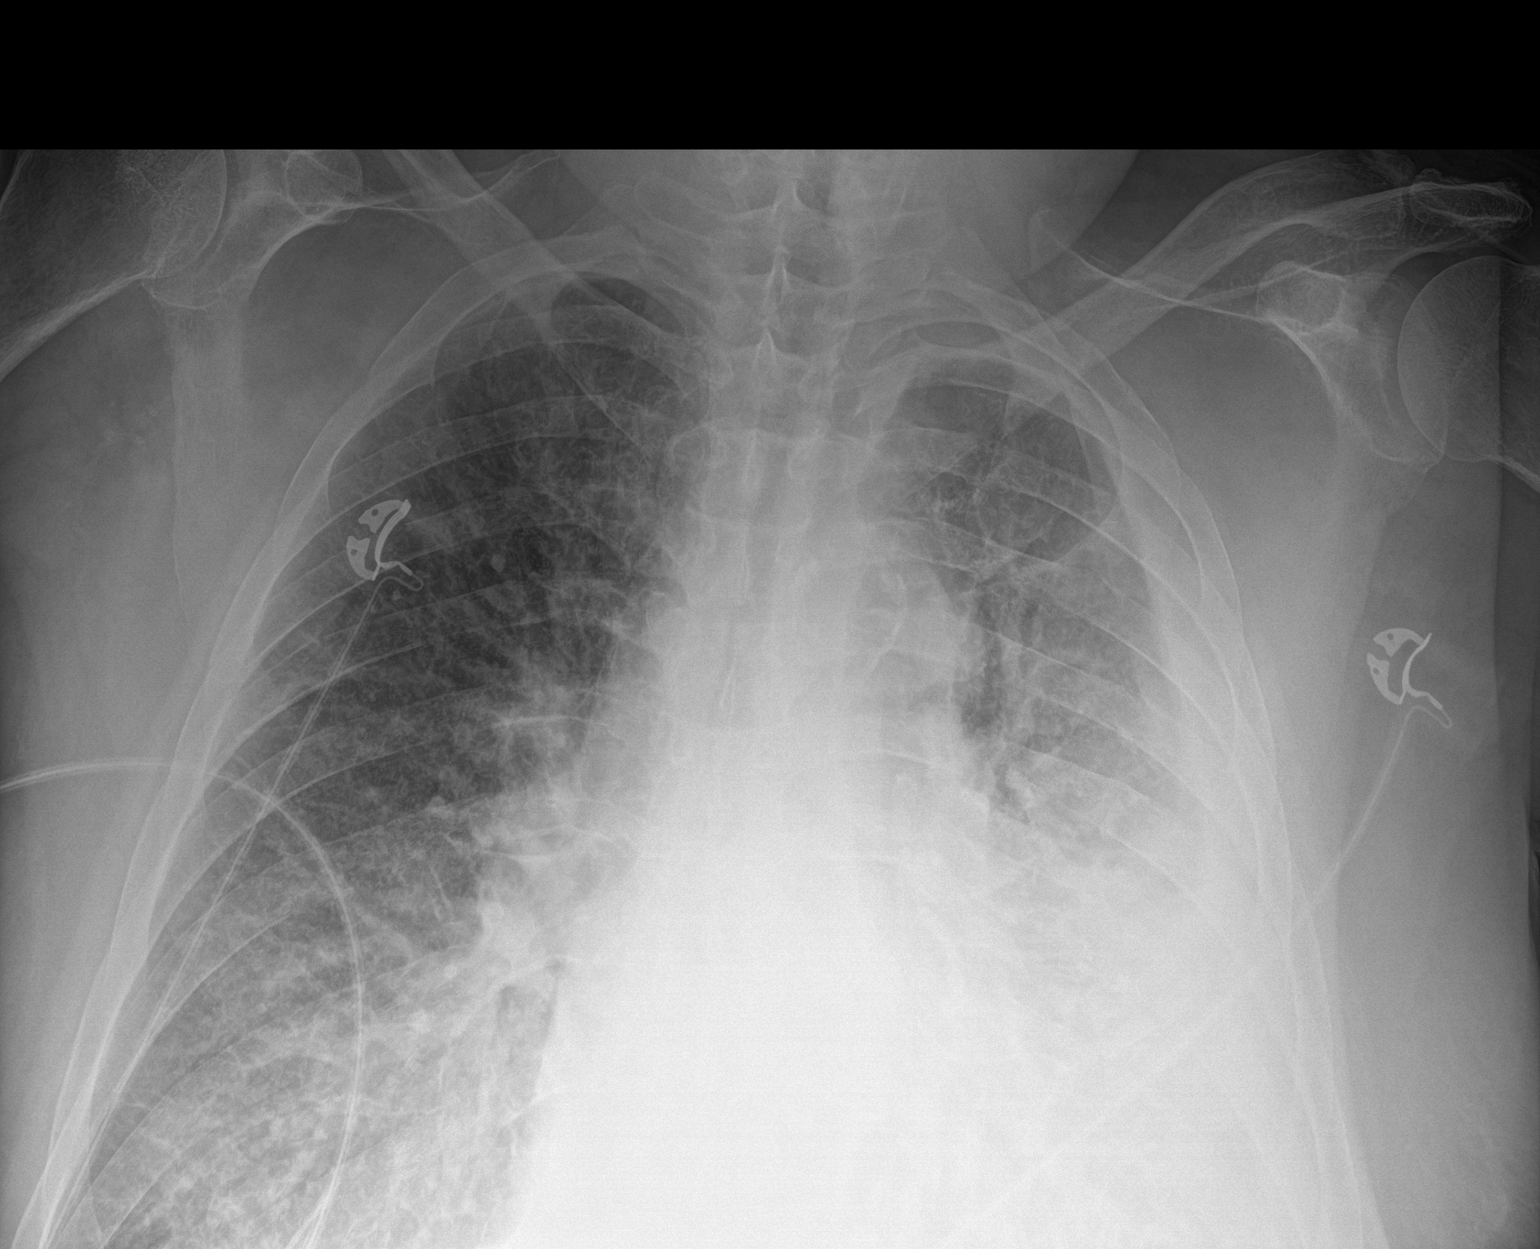

[chest ap (2 of 2)]
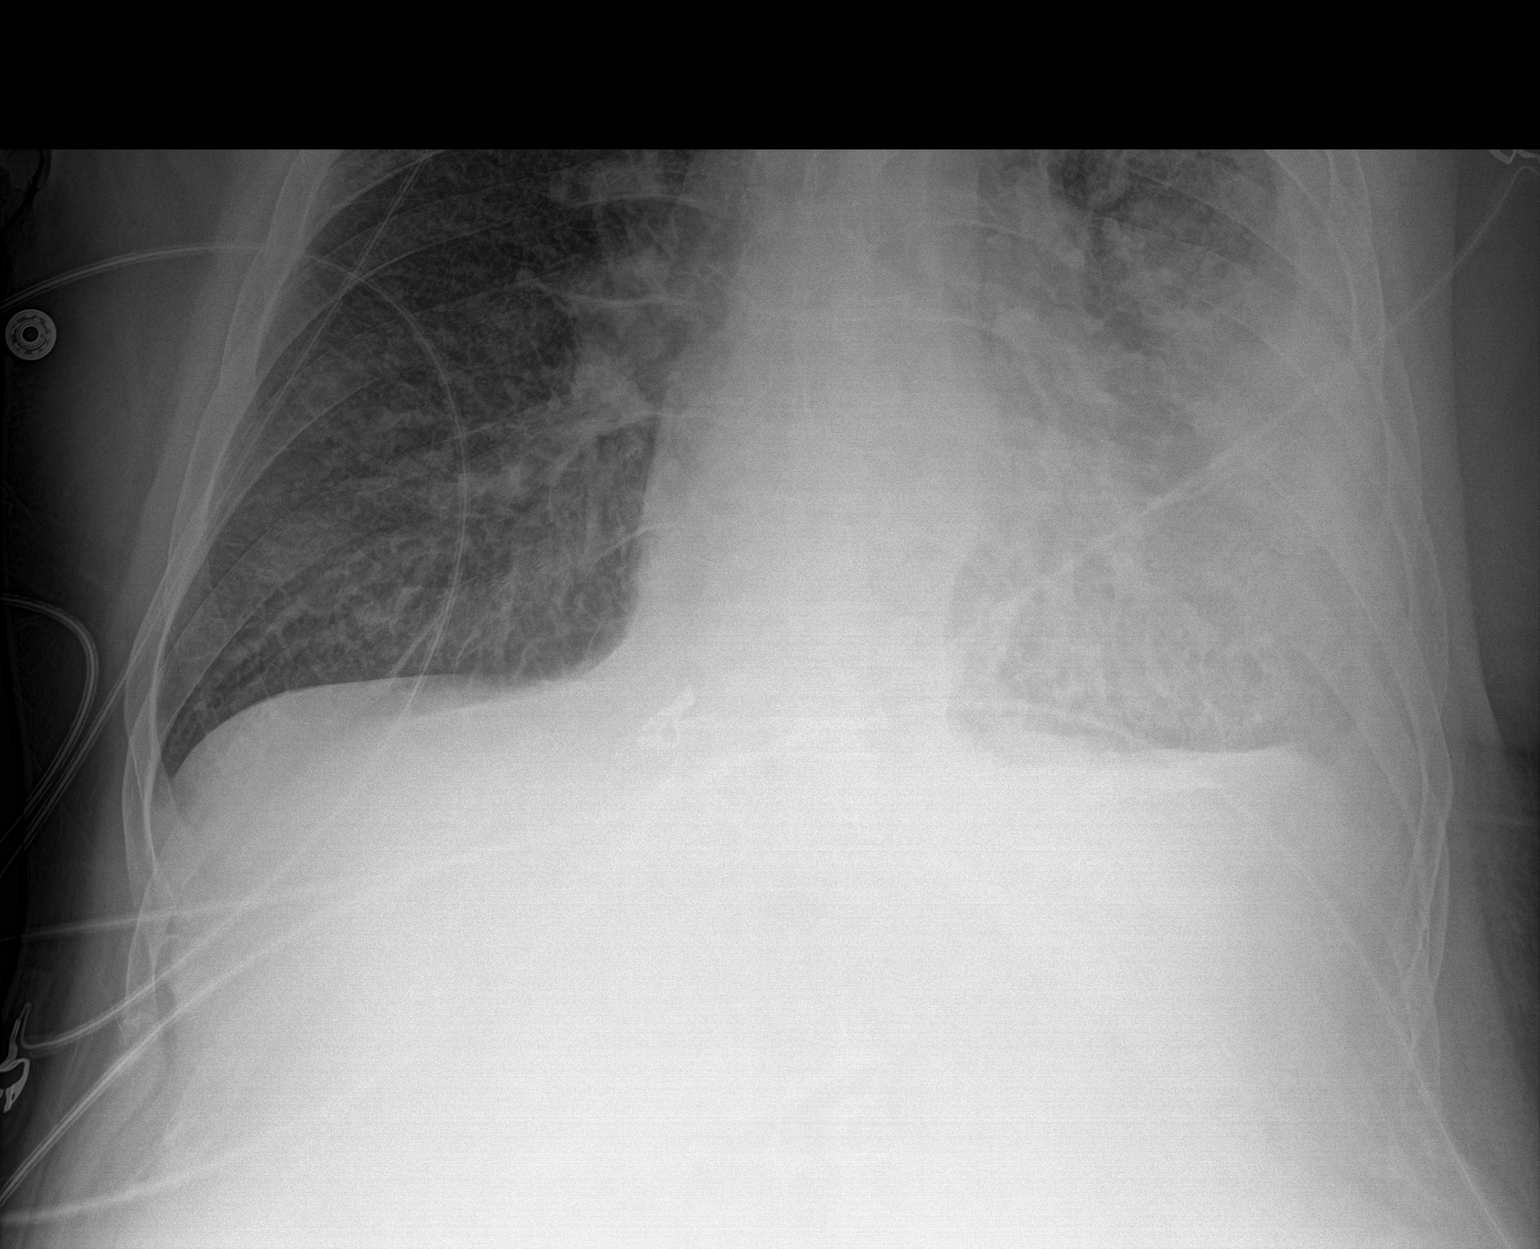

[2 of 2 positions shown; findings below may reference images not displayed]

FINDINGS: Stable cardiac enlargement. Increase in pulmonary vascularity is
consistent with pulmonary edema/congestive heart failure. Stable
left pleural effusion. No pneumothorax.
IMPRESSION: Increase in pulmonary edema/congestive heart failure. Stable
appearance of left pleural effusion.

## 2021-06-23 MED ORDER — FUROSEMIDE 10 MG/ML IJ SOLN: 40.0000 mg | Freq: Once | INTRAMUSCULAR | Status: DC

## 2021-06-23 MED ORDER — FUROSEMIDE 40 MG PO TABS: 40.0000 mg | ORAL_TABLET | Freq: Every day | ORAL | Status: DC

## 2021-06-23 MED ORDER — BUDESONIDE 0.25 MG/2ML IN SUSP
0.2500 mg | Freq: Two times a day (BID) | RESPIRATORY_TRACT | Status: DC
  Administered 2021-06-23 – 2021-06-25 (×5): 0.25 mg via RESPIRATORY_TRACT
  Filled 2021-06-23 (×5): qty 2

## 2021-06-23 MED ORDER — IPRATROPIUM-ALBUTEROL 0.5-2.5 (3) MG/3ML IN SOLN
3.0000 mL | Freq: Four times a day (QID) | RESPIRATORY_TRACT | Status: DC
  Administered 2021-06-23 (×2): 3 mL via RESPIRATORY_TRACT
  Filled 2021-06-23 (×2): qty 3

## 2021-06-23 MED ORDER — FUROSEMIDE 10 MG/ML IJ SOLN
40.0000 mg | Freq: Once | INTRAMUSCULAR | Status: AC
  Administered 2021-06-23: 40 mg via INTRAVENOUS
  Filled 2021-06-23: qty 4

## 2021-06-23 MED ORDER — IPRATROPIUM-ALBUTEROL 0.5-2.5 (3) MG/3ML IN SOLN
3.0000 mL | Freq: Three times a day (TID) | RESPIRATORY_TRACT | Status: DC
  Administered 2021-06-24 – 2021-06-25 (×3): 3 mL via RESPIRATORY_TRACT
  Filled 2021-06-23 (×4): qty 3

## 2021-06-23 NOTE — Assessment & Plan Note (Deleted)
Oral Fidaxomicin.

## 2021-06-23 NOTE — Progress Notes (Signed)
°  Went to beside to check on patient, breathing comfortably on 6L [place]. No signs of respiratory distress. Able to speak in complete sentences. Will continue with diuresis and current plan in place. CCM to consider possible thoracentesis for pleural effusion once optimally diuresed. Night team will see patient later this evening.   Donney Dice, DO 06/23/2021, 6:25 PM PGY-2, [facility] Service pager [phone]

## 2021-06-23 NOTE — Progress Notes (Signed)
Called to room for Respiratory distress.  Upon arrival, patient in severe distress.  Placed patient on bipap 16/8 at 80%.  Patient with slight improvement after bipap.  Will continue to monitor.

## 2021-06-23 NOTE — Progress Notes (Signed)
Family Medicine Teaching Service Daily Progress Note Intern Pager: [phone]  Patient name: Rodney [name] Medical record number: [id number] Date of birth: [birth year] Age: 71 y.o. Gender: male  Primary Care Provider: [name], MD Consultants: Vascular surgery Code Status: Full  Pt Overview and Major Events to Date:  2/24- admitted  Assessment and Plan: Rodney [name] is a 71 y.o. male presenting with multiple weeks of nonbloody nonwatery diarrhea found to be C Diff positive. PMH is significant for type 2 diabetes, obesity, tobacco abuse, hypertension, protein calorie malnutrition, aortic stenosis, anemia of chronic disease, PVD, HFrEF, PAF, s/p BKA, CAD, CKD stage III, COPD  Acute on Chronic Hypoxic Respiratory Failure Persistent Left pleural effusion COPD Upon entering the room, Rodney [name] was in acute respiratory distress, stating "I can't breathe." His O2 was not in place and he was working very hard to breathe. RT was called and placed patient on BiPAP with only modest improvement in his respiratory status. Repeat CXR, ABG, and dose of Lasix ordered. Also note that we have been holding DVT ppx in the setting of acute anemia as discussed below, acute PE cannot be ruled out. Given multiple co-morbidities, Rodney [name] is high risk for acute decompensation, will therefore consult CCM for possible ICU transfer.  - BiPAP - Consult to CCM - CXR - ABG - IV Lasix 40mg  x1 - Duonebs q4h PRN - Breo and Incruse  C Difficile Colitis Afebrile. Diarrhea much improved. Formed stool this morning. WBC wnl at 5.5.  - Continue oral fidaxomicin (day 4/10)  PVD   R leg wounds   R second toe gangrene   CAD   S/p L BKA [facility] following. Repeat ABI and duplex yesterday show patent stent and are essentially unchanged from previous. Pt continues to refuse amputation. - Continue plavix + statin - WOC following - PT/OT  Anemia of chronic disease Hgb 7.9 on am CBC, was 8.1 on STAT H&H  re-check. Threshold <8.0 due to CAD/PAD. No evidence of active blood loss. - Holding lovenox - Follow CBC  AKI on CKD IIIa Cr 1.65>1.47. BL 1.3.  - Continue to monitor  Chronic, stable conditions T2DM- Levemir 13u, sSSI Paroxysmal A-fib-amiodarone 200 mg daily GERD-Protonix 40mg  twice daily Insomnia-trazodone 25-50 mg nightly Tobacco use-nicotine patch   FEN/GI: Carb-modified PPx: SCDs Dispo: Pending further workup    Subjective:  Upon my entering the room, Rodney [name] was noted to be in respiratory distress as above.  He denied any chest pain.  Objective: Temp:  [97.6 F (36.4 C)-98.2 F (36.8 C)] 97.7 F (36.5 C) (02/27 0304) Pulse Rate:  [69-103] 103 (02/27 0807) Resp:  [17-36] 36 (02/27 0807) BP: (98-131)/(50-69) 121/50 (02/27 0304) SpO2:  [96 %-100 %] 100 % (02/27 0807) Weight:  [122.6 kg] 122.6 kg (02/27 0223) Physical Exam: General: In acute respiratory distress Cardiovascular: Tachycardic, regular rhythm, systolic murmur present Respiratory: Increased work of breathing on 6 L nasal cannula, placed on BiPAP and persistently increased work of breathing on BiPAP as well Abdomen: Obese, nontender, nondistended Extremities: Right lower extremity with chronic changes stable from my previous exams  Laboratory: Recent Labs  Lab 06/21/21 0155 06/22/21 0035 06/22/21 0806 06/23/21 0151 06/23/21 0409  WBC 7.7 6.0  --  5.5  --   HGB 8.3* 7.7* 8.2* 7.9* 8.1*  HCT 27.0* 24.7* 26.8* 25.5* 26.3*  PLT 275 274  --  291  --    Recent Labs  Lab 06/19/21 1717 06/20/21 0457 06/21/21 0155 06/22/21 0035 06/23/21 0151  NA 133*   < > 136 134* 135  K 4.4   < > 4.7 4.4 4.7  CL 98   < > 101 101 101  CO2 28   < > 30 27 27   BUN 27*   < > 27* 29* 26*  CREATININE 1.88*   < > 1.62* 1.65* 1.47*  CALCIUM 7.6*   < > 7.8* 7.6* 8.0*  PROT 5.1*  --   --   --   --   BILITOT 0.5  --   --   --   --   ALKPHOS 82  --   --   --   --   ALT 25  --   --   --   --   AST 18  --   --   --    --   GLUCOSE 106*   < > 140* 178* 163*   < > = values in this interval not displayed.   ABG pending  Imaging/Diagnostic Tests:  DG CHEST PORT 1 VIEW CLINICAL DATA:  Increased shortness of breath.  EXAM: PORTABLE CHEST 1 VIEW  COMPARISON:  06/20/2021  FINDINGS: Stable cardiac enlargement. Increase in pulmonary vascularity is consistent with pulmonary edema/congestive heart failure. Stable left pleural effusion. No pneumothorax.  IMPRESSION: Increase in pulmonary edema/congestive heart failure. Stable appearance of left pleural effusion.  Electronically Signed   By: Aletta Edouard M.D.   On: 06/23/2021 08:18    Eppie Gibson, MD 06/23/2021, 8:19 AM PGY-1, [facility] Intern pager: [phone], text pages welcome

## 2021-06-23 NOTE — Progress Notes (Signed)
PT Cancellation Note  Patient Details Name: Rodney [name] MRN: [id number] DOB: 1950-06-21   Cancelled Treatment:    Reason Eval/Treat Not Completed: Patient not medically ready  Patient recently placed on BiPaP due to respiratory decline. PT eval deferred at this time and will monitor for appropriateness.   [name], PT Acute Rehabilitation Services  Pager [phone] Office [phone]   [name] 06/23/2021, 8:41 AM

## 2021-06-23 NOTE — Discharge Instructions (Addendum)
Dear Rodney [name],   Thank you so much for allowing [place] to be part of your care!  You were admitted to [facility] for C diff, which causes diarrhea. We treated you with a medication called Fidaxomicin for a total of 10 days, will finish course on 06/29/21. Medication should be available at your house to take when you get home. Please follow up with PCP one week after discharge from the hospital to monitor your symptoms. Please keep the right foot clean and dry.    POST-HOSPITAL & CARE INSTRUCTIONS Cardiology and vascular specialists follow up  Follow up with Dr. Andria Frames in 1 week  Please let PCP/Specialists know of any changes that were made.  Please see medications section of this packet for any medication changes.   DOCTOR'S APPOINTMENT & FOLLOW UP CARE INSTRUCTIONS  Future Appointments  Date Time Provider [place]  07/11/2021  8:40 AM O'Neal, Cassie Freer, MD [facility]  07/15/2021  9:30 AM [facility]    RETURN PRECAUTIONS:  If you experience severe pain in the foot, chest pain, shortness of breath, diarrhea that does not go away, blood in stool, please seek healthcare professionals immediately.   Take care and be well!  [facility]  [place], [place] [address] [phone]

## 2021-06-23 NOTE — Progress Notes (Signed)
Notified that patient experiencing respiratory distress, hypoxic to 70-80% on baseline oxygen requirement 2L. Soon after requiring BiPAP. Presented at bedside and patient RR up to 37 on BiPAP. Able to follow commands. CXR obtained, awaiting results. Given concern that patient may require possible intubation, contacted CCM who agreed to come evaluate the patient for potential critical care transfer. Remained at bedside until [facility] provider arrived.   Donney Dice, DO 06/23/2021, 8:20 AM PGY-2, [place] [facility] Service pager [phone]

## 2021-06-23 NOTE — Progress Notes (Signed)
RT to obtain ABG. Order verbally d/c per CCM. RT will continue to monitor.

## 2021-06-23 NOTE — Progress Notes (Signed)
OT Cancellation Note  Patient Details Name: Rodney [name] MRN: [id number] DOB: [birth year]   Cancelled Treatment:    Reason Eval/Treat Not Completed: Medical issues which prohibited therapy Per RN, pt with desats and being placed on BiPAP currently. Will hold therapy attempts at this time and follow-up as schedule permits.  Layla Maw 06/23/2021, 7:59 AM

## 2021-06-23 NOTE — Progress Notes (Signed)
RT removed patient from bipap to administer neb treatments. Patient has improved tremendously from this am. Patient remains off bipap and placed on 3lnc. Patient is tolerating well at this time. RT will continue to monitor.

## 2021-06-23 NOTE — Progress Notes (Signed)
[facility]  Subjective  -states he may be here another 1 or 2 days.  Still refusing toe amputation.  Objective 138/69 85 97.6 F (36.4 C) (Axillary) 20 100%  Intake/Output Summary (Last 24 hours) at 06/23/2021 1527 Last data filed at 06/23/2021 0305 Gross per 24 hour  Intake --  Output 500 ml  Net -500 ml       Laboratory Lab Results: Recent Labs    06/22/21 0035 06/22/21 0806 06/23/21 0151 06/23/21 0409  WBC 6.0  --  5.5  --   HGB 7.7*   < > 7.9* 8.1*  HCT 24.7*   < > 25.5* 26.3*  PLT 274  --  291  --    < > = values in this interval not displayed.   BMET Recent Labs    06/22/21 0035 06/23/21 0151  NA 134* 135  K 4.4 4.7  CL 101 101  CO2 27 27  GLUCOSE 178* 163*  BUN 29* 26*  CREATININE 1.65* 1.47*  CALCIUM 7.6* 8.0*    COAG Lab Results  Component Value Date   INR 1.2 05/14/2021   INR 1.2 05/13/2021   INR 1.1 05/12/2021   No results found for: PTT  Assessment/Planning:   71 year old male that vascular was consulted Friday for tissue loss in the right foot.  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  I reviewed his arterial duplex this admission that shows widely patent stents after most recent intervention.  His previous right femoropopliteal bypass has been realigned with stents after it failed in the past.  Stents are widely patent at this time with no evidence of recurrent stenosis.  Optimized from vascular standpoint.  I again discussed the option of toe amputation but ultimately he is against this.  No signs of infection at this time and not having any pain.  We will arrange follow-up in our office in 1 month.  Please call vascular if any further questions or concerns.  Betadine paint to the toe would be appropriate.  He is getting Xeroform to the other wounds on the dorsum of the foot which should be appropriate wound care.  Marty Heck 06/23/2021 3:27 PM --

## 2021-06-23 NOTE — Progress Notes (Signed)
FPTS Brief Progress Note  S:Patient sleeping comfortably, did not disturb   O: BP 132/68    Pulse 76    Temp 98.2 F (36.8 C) (Oral)    Resp (!) 21    Ht 6' (1.829 m)    Wt 270 lb 4.5 oz (122.6 kg)    SpO2 98%    BMI 36.66 kg/m   General: NAD, sleeping on left side Respiratory: on 4-5L [place], breathing comfortably, intermittently snoring  A/P: Acute on chronic respiratory failure   HFrEF   COPD - Orders reviewed. Labs for AM ordered, which was adjusted as needed.  - If worsening respiratory status, escalate supplemental O2 as necessary. If requiring BiPAP or no improvement, will discuss further with CCM - Continue IV diuresis - Duonebs q4h PRN - Breo and Cinda Quest, DO 06/23/2021, 10:34 PM PGY-2, [facility] Night Resident  Please page [phone] with questions.

## 2021-06-23 NOTE — Consult Note (Signed)
NAME:  Rodney [name], MRN:  [id number], DOB:  20-Apr-1951, LOS: 4 ADMISSION DATE:  06/19/2021, CONSULTATION DATE:  06/23/2021 REFERRING MD:  Larae Grooms, CHIEF COMPLAINT:  Acute on Chronic Respiratory Distress   History of Present Illness:  Rodney [name] is a 71 y.o. male former smoker with a 50 pack year smoking history quit 04/09/2021, and chronic pleural effusion presenting to [facility] 06/19/2021 with multiple weeks of nonbloody nonwatery diarrhea found to be C Diff positive. PMH is significant for COPD. He wears 2 L [place] at baseline, type 2 diabetes, obesity, tobacco abuse, hypertension, protein calorie malnutrition, aortic stenosis, anemia of chronic disease, PVD, HFrEF, PAF, s/p BKA, CAD, CKD stage III.  Pt. Developed worsening respiratory distress the morning of 06/23/2021, with oxygen sats dropping to the 70's on 3 L. Pt. Was placed on BiPAP and PCCM has been asked to assist with care .    Pertinent  Medical History  As noted above  Significant Hospital Events: Including procedures, antibiotic start and stop dates in addition to other pertinent events   06/19/2021 Admission   Interim History / Subjective:  Currently on BiPAP , Resp rate has dropped from 30's to 25, he is awake and answering questions. Sats are 100%. Afebrile, WBC of 5.5  Objective   Blood pressure (!) 121/50, pulse (!) 103, temperature 97.7 F (36.5 C), temperature source Oral, resp. rate (!) 36, height 6' (1.829 m), weight 122.6 kg, SpO2 100 %.        Intake/Output Summary (Last 24 hours) at 06/23/2021 0844 Last data filed at 06/23/2021 0305 Gross per 24 hour  Intake --  Output 800 ml  Net -800 ml   Filed Weights   06/19/21 2300 06/21/21 0500 06/23/21 0223  Weight: 121.2 kg 121.9 kg 122.6 kg    Examination: General: Elderly obese male , on BiPAP, awake and alert, mild distress HENT:  Thick neck, bearded, no LAD, No visible JVD Lungs:  Bilateral chest excursion, distant , diminished per bases Cardiovascular:  S1, S2, Irr, A fib per tele, No MRG Abdomen:  Obese, NT, ND, BS +, Body mass index is 36.66 kg/m. 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  Neuro: Awake and alert, oriented x 3 GU: NA  Resolved Hospital Problem list   Diarrhea is better than on admission   Assessment & Plan:  Acute on Chronic Respiratory Failure 2/2 Heart Failure and Pulmonary Edema per imaging Plan  Lasix 40 mg IV now.  Place Condom cath for Strict I&O If unable to place Condom cath, please contact Primary team  for [place] order BNP ordered as add on to previous draw Wean FiO2 for sats > 88% ( With Hx of COPD ) Change Duonebs to scheduled , not prn Add Pulmicort scheduled nebs Transition back to Trelegy once resp status has improved.  KVO maintenance fluids if any are running.   Acute on chronic Heart Failure  Last EF 50-55% , severe aortic stenosis, Moderate mitral annular calcification, LV low normal function, LV moderately dilated, mild LV hypertrophy( 02/2021)  Plan Check and trend BNP Lasix 40 IV now  Strict I&O  Chronic Effusion  Plan May need to consider thoracentesis if he does not improve Will need Plavix held x 5 days if this is determined to be necessary>> No plans for this now  CKD stage III Creatinine of 1.47 on 2/27 Plan Trend BNP Ability to use  diuretic will be limited by renal function   Anemia HGB of 7.9, ? Hemodilutional Plan Trend CBC Monitor for Bleeding Consider Iron studies  Best Practice (right click and "Reselect all SmartList Selections" daily)  All best practice per Primary Team  Pt. Updated at bedside 06/23/21. If he does not turn around goals of care discussions will be needed to determine best plan of care  Labs   CBC: Recent Labs  Lab 06/19/21 1717 06/20/21 0457 06/21/21 0155 06/22/21 0035 06/22/21 0806 06/23/21 0151 06/23/21 0409  WBC 11.7* 10.1 7.7 6.0  --  5.5   --   NEUTROABS 9.6*  --   --   --   --   --   --   HGB 9.2* 8.8* 8.3* 7.7* 8.2* 7.9* 8.1*  HCT 29.7* 28.0* 27.0* 24.7* 26.8* 25.5* 26.3*  MCV 87.1 87.0 87.4 87.3  --  87.3  --   PLT 269 277 275 274  --  291  --     Basic Metabolic Panel: Recent Labs  Lab 06/19/21 1717 06/20/21 0457 06/21/21 0155 06/22/21 0035 06/23/21 0151  NA 133* 135 136 134* 135  K 4.4 4.4 4.7 4.4 4.7  CL 98 100 101 101 101  CO2 28 27 30 27 27   GLUCOSE 106* 158* 140* 178* 163*  BUN 27* 28* 27* 29* 26*  CREATININE 1.88* 1.76* 1.62* 1.65* 1.47*  CALCIUM 7.6* 7.7* 7.8* 7.6* 8.0*   GFR: Estimated Creatinine Clearance: 64.1 mL/min (A) (by C-G formula based on SCr of 1.47 mg/dL (H)). Recent Labs  Lab 06/20/21 0457 06/21/21 0155 06/22/21 0035 06/23/21 0151  WBC 10.1 7.7 6.0 5.5    Liver Function Tests: Recent Labs  Lab 06/19/21 1717  AST 18  ALT 25  ALKPHOS 82  BILITOT 0.5  PROT 5.1*  ALBUMIN 2.0*   No results for input(s): LIPASE, AMYLASE in the last 168 hours. No results for input(s): AMMONIA in the last 168 hours.  ABG    Component Value Date/Time   PHART 7.297 (L) 03/24/2021 0328   PCO2ART 50.0 (H) 03/24/2021 0328   PO2ART 370 (H) 03/24/2021 0328   HCO3 23.0 03/24/2021 0328   TCO2 26 01/26/2021 0615   ACIDBASEDEF 2.3 (H) 03/24/2021 0328   O2SAT 99.9 03/24/2021 0328     Coagulation Profile: No results for input(s): INR, PROTIME in the last 168 hours.  Cardiac Enzymes: No results for input(s): CKTOTAL, CKMB, CKMBINDEX, TROPONINI in the last 168 hours.  HbA1C: Hemoglobin A1C  Date/Time Value Ref Range Status  07/03/2020 03:58 PM 6.5 (A) 4.0 - 5.6 % Final   HbA1c, POC (controlled diabetic range)  Date/Time Value Ref Range Status  10/14/2020 11:30 AM 6.1 0.0 - 7.0 % Final  09/21/2019 02:51 PM 8.7 (A) 0.0 - 7.0 % Final   Hgb A1c MFr Bld  Date/Time Value Ref Range Status  03/24/2021 07:03 AM 6.9 (H) 4.8 - 5.6 % Final    Comment:    (NOTE)         Prediabetes: 5.7 - 6.4          Diabetes: >6.4         Glycemic control for adults with diabetes: <7.0   04/03/2020 06:30 PM 7.5 (H) 4.8 - 5.6 % Final    Comment:    (NOTE) Pre diabetes:          5.7%-6.4%  Diabetes:              >6.4%  Glycemic control for   <  7.0% adults with diabetes     CBG: Recent Labs  Lab 06/22/21 0816 06/22/21 1145 06/22/21 1609 06/22/21 1914 06/22/21 2303  GLUCAP 180* 161* 178* 171* 166*    Review of Systems:   + Shortness of breath   Past Medical History:  He,  has a past medical history of Acute combined systolic and diastolic congestive heart failure ([place]), Acute on chronic heart failure with preserved ejection fraction (HFpEF) ([place]), Acute respiratory failure with hypoxia ([place]), Aortic stenosis, Atrial fibrillation ([place]), CHF (congestive heart failure) ([place]), COPD exacerbation ([place]) (12/09/2020), Coronary artery disease, Demand ischemia ([place]) (03/26/2021), Diabetes mellitus without complication ([place]), HLD (hyperlipidemia), Hypertension, Long term (current) use of anticoagulants (12/29/2019), Malnutrition of moderate degree (05/09/2021), Peripheral arterial disease ([place]), and Pneumonia due to COVID-19 virus (05/29/2021).   Surgical History:   Past Surgical History:  Procedure Laterality Date   ABDOMINAL AORTOGRAM W/LOWER EXTREMITY N/A 08/05/2020   Procedure: ABDOMINAL AORTOGRAM W/LOWER EXTREMITY;  Surgeon: Marty Heck, MD;  Location: [place] [facility];  Service: Cardiovascular;  Laterality: N/A;   ABDOMINAL AORTOGRAM W/LOWER EXTREMITY N/A 11/13/2020   Procedure: ABDOMINAL AORTOGRAM W/LOWER EXTREMITY;  Surgeon: Cherre Robins, MD;  Location: [place] [facility];  Service: Cardiovascular;  Laterality: N/A;   ABDOMINAL AORTOGRAM W/LOWER EXTREMITY N/A 05/12/2021   Procedure: ABDOMINAL AORTOGRAM W/LOWER EXTREMITY;  Surgeon: Waynetta Sandy, MD;  Location: [place] [facility];  Service: Cardiovascular;  Laterality: N/A;   AMPUTATION Left 09/28/2019   Procedure:  AMPUTATION BELOW KNEE;  Surgeon: Newt Minion, MD;  Location: [place];  Service: Orthopedics;  Laterality: Left;   AMPUTATION Right 11/15/2020   Procedure: RIGHT GREAT TOE AMPUTATION;  Surgeon: Newt Minion, MD;  Location: [place];  Service: Orthopedics;  Laterality: Right;   CARDIAC CATHETERIZATION     CARDIOVERSION N/A 10/05/2019   Procedure: CARDIOVERSION;  Surgeon: Sanda Klein, MD;  Location: [facility];  Service: Cardiovascular;  Laterality: N/A;   FEMORAL-POPLITEAL BYPASS GRAFT Right 08/07/2020   Procedure: RIGHT FEMORAL TO BELOW KNEE POPLITEAL ARTERY BYPASS;  Surgeon: Waynetta Sandy, MD;  Location: [place];  Service: Vascular;  Laterality: Right;   LEFT HEART CATH AND CORONARY ANGIOGRAPHY N/A 10/03/2019   Procedure: LEFT HEART CATH AND CORONARY ANGIOGRAPHY;  Surgeon: Lorretta Harp, MD;  Location: [place] [facility];  Service: Cardiovascular;  Laterality: N/A;   PERIPHERAL VASCULAR INTERVENTION Right 08/05/2020   Procedure: PERIPHERAL VASCULAR INTERVENTION;  Surgeon: Marty Heck, MD;  Location: [place] [facility];  Service: Cardiovascular;  Laterality: Right;  common Iliac   PERIPHERAL VASCULAR INTERVENTION Left 11/13/2020   Procedure: PERIPHERAL VASCULAR INTERVENTION;  Surgeon: Cherre Robins, MD;  Location: [place] [facility];  Service: Cardiovascular;  Laterality: Left;   PERIPHERAL VASCULAR INTERVENTION Right 11/14/2020   Procedure: PERIPHERAL VASCULAR INTERVENTION;  Surgeon: Marty Heck, MD;  Location: [place] [facility];  Service: Cardiovascular;  Laterality: Right;  POP/SFA STENT   PERIPHERAL VASCULAR INTERVENTION  05/12/2021   Procedure: PERIPHERAL VASCULAR INTERVENTION;  Surgeon: Waynetta Sandy, MD;  Location: [place] [facility];  Service: Cardiovascular;;   TEE WITHOUT CARDIOVERSION N/A 10/05/2019   Procedure: TRANSESOPHAGEAL ECHOCARDIOGRAM (TEE);  Surgeon: Sanda Klein, MD;  Location: [facility];  Service: Cardiovascular;  Laterality:  N/A;     Social History:   reports that he has quit smoking. His smoking use included cigarettes. He started smoking about 2 months ago. He has a 25.00 pack-year smoking history. He has never used smokeless tobacco. He reports  current alcohol use of about 6.0 standard drinks per week. He reports that he does not use drugs.   Family History:  His family history includes Alcoholism in his father and mother.   Allergies No Known Allergies   Home Medications  Prior to Admission medications   Medication Sig Start Date End Date Taking? Authorizing Provider  acetaminophen (TYLENOL) 650 MG CR tablet Take 1,300 mg by mouth in the morning and at bedtime.   Yes [provider]  albuterol (VENTOLIN HFA) 108 (90 Base) MCG/ACT inhaler INHALE 2 PUFFS INTO THE LUNGS EVERY 6 (SIX) HOURS AS NEEDED FOR WHEEZING OR SHORTNESS OF BREATH. 04/24/21  Yes Hensel, Jamal Collin, MD  amiodarone (PACERONE) 200 MG tablet Take 1 tablet (200 mg total) by mouth daily. 10/14/20  Yes Hensel, Jamal Collin, MD  atorvastatin (LIPITOR) 80 MG tablet TAKE 1 TABLET EVERY DAY Patient taking differently: Take 80 mg by mouth daily. 03/11/21  Yes O'Neal, Cassie Freer, MD  carvedilol (COREG) 12.5 MG tablet Take 6.25 mg by mouth 2 (two) times daily with a meal. 12/18/20  Yes [provider]  clopidogrel (PLAVIX) 75 MG tablet TAKE 1 TABLET EVERY DAY WITH BREAKFAST Patient taking differently: Take 75 mg by mouth daily. 03/17/21  Yes Hensel, Jamal Collin, MD  ferrous sulfate 325 (65 FE) MG tablet Take 1 tablet (325 mg total) by mouth every other day. Patient taking differently: Take 325 mg by mouth daily with breakfast. 05/18/21  Yes Paige, Victoria J, DO  fluticasone (FLONASE) 50 MCG/ACT nasal spray Place 2 sprays into both nostrils daily. 02/13/21  Yes Simmons-Robinson, Makiera, MD  Fluticasone-Umeclidin-Vilant (TRELEGY ELLIPTA) 100-62.5-25 MCG/ACT AEPB Inhale 1 puff into the lungs daily. 06/01/21  Yes Eppie Gibson, MD   furosemide (LASIX) 40 MG tablet Take 1 tablet (40 mg total) by mouth daily as needed for fluid (swelling). Patient taking differently: Take 40 mg by mouth daily. 02/13/21  Yes Simmons-Robinson, Makiera, MD  gabapentin (NEURONTIN) 300 MG capsule Take 600 mg by mouth at bedtime.   Yes [provider]  insulin detemir (LEVEMIR) 100 UNIT/ML FlexPen Inject 25 Units into the skin daily. 05/16/21  Yes Paige, Victoria J, DO  lactose free nutrition (BOOST) LIQD Take 237 mLs by mouth 3 (three) times a week.   Yes [provider]  pantoprazole (PROTONIX) 40 MG tablet Take 1 tablet (40 mg total) by mouth 2 (two) times daily. 05/16/21 06/20/21 Yes Paige, Weldon Picking, DO  traZODone (DESYREL) 50 MG tablet Take 0.5-1 tablets (25-50 mg total) by mouth at bedtime as needed for sleep. 05/19/21  Yes Hensel, Jamal Collin, MD  Blood Glucose Monitoring Suppl (TRUE METRIX METER) DEVI Use to test blood sugar three times daily. Patient not taking: Reported on 05/07/2021 11/20/19   [name], MD  Blood Glucose Monitoring Suppl (TRUE METRIX METER) w/Device KIT USE AS DIRECTED Patient not taking: Reported on 05/07/2021 03/25/20   [name], MD  glucose blood (RELION TRUE METRIX TEST STRIPS) test strip Use to test blood sugar three times per day. Patient not taking: Reported on 05/07/2021 11/20/19   [name], MD  Insulin Pen Needle 32G X 4 MM MISC Use to inject insulin up to 4 times daily as directed, 05/16/21     ipratropium-albuterol (DUONEB) 0.5-2.5 (3) MG/3ML SOLN Take 3 mLs by nebulization every 4 (four) hours as needed. 05/19/21   [name], MD  metFORMIN (GLUCOPHAGE) 1000 MG tablet Take 1 tablet (1,000 mg total) by mouth daily. 04/07/21  Lavina Hamman, MD  TRUEplus Lancets 33G MISC Use to test blood sugar three times per day. Patient not taking: Reported on 05/07/2021 11/20/19   [name], MD     Critical care time: 55 minutes    Magdalen Spatz, MSN, AGACNP-BC [place] for personal pager PCCM on call pager [phone]  06/23/2021 9:47 AM

## 2021-06-23 NOTE — Consult Note (Signed)
° °  [facility] Inpatient Consult   06/23/2021  Rodney [name] 10-Aug-1950 [id number]  [place] [facility] [ACO] Patient: Rodney [name]  Primary Care Provider:  [name], MD, [facility] is an [name] provider with a Chronic Care Management team and program.  Patient is currently active with [place] for chronic disease management services.  Noted that [name] CCM RN has noted difficulty maintaining contact with patient.  Plan: Continue to follow for progress and to make [name] [CCM] team member  aware of any updates and post hospital follow up needs.  Of note, [facility] Care Management services does not replace or interfere with any services that are needed or arranged by inpatient [facility] care management team.  For additional questions or referrals please contact:  [name], RN BSN [facility] Liaison  [phone] business mobile phone Toll free office [phone]  Fax number: [phone] [email or web address]

## 2021-06-24 DIAGNOSIS — J9611 Chronic respiratory failure with hypoxia: Secondary | ICD-10-CM | POA: Diagnosis not present

## 2021-06-24 DIAGNOSIS — I350 Nonrheumatic aortic (valve) stenosis: Secondary | ICD-10-CM | POA: Diagnosis not present

## 2021-06-24 DIAGNOSIS — I5020 Unspecified systolic (congestive) heart failure: Secondary | ICD-10-CM | POA: Diagnosis not present

## 2021-06-24 DIAGNOSIS — A0472 Enterocolitis due to Clostridium difficile, not specified as recurrent: Secondary | ICD-10-CM | POA: Diagnosis not present

## 2021-06-24 LAB — PREPARE RBC (CROSSMATCH)

## 2021-06-24 LAB — GLUCOSE, CAPILLARY
Glucose-Capillary: 149 mg/dL — ABNORMAL HIGH (ref 70–99)
Glucose-Capillary: 172 mg/dL — ABNORMAL HIGH (ref 70–99)
Glucose-Capillary: 219 mg/dL — ABNORMAL HIGH (ref 70–99)
Glucose-Capillary: 205 mg/dL — ABNORMAL HIGH (ref 70–99)

## 2021-06-24 LAB — BASIC METABOLIC PANEL
Anion gap: 6 (ref 5–15)
BUN: 26 mg/dL — ABNORMAL HIGH (ref 8–23)
CO2: 30 mmol/L (ref 22–32)
Calcium: 7.9 mg/dL — ABNORMAL LOW (ref 8.9–10.3)
Chloride: 99 mmol/L (ref 98–111)
Creatinine, Ser: 1.41 mg/dL — ABNORMAL HIGH (ref 0.61–1.24)
GFR, Estimated: 54 mL/min — ABNORMAL LOW (ref >60)
Glucose, Bld: 157 mg/dL — ABNORMAL HIGH (ref 70–99)
Potassium: 4.7 mmol/L (ref 3.5–5.1)
Sodium: 135 mmol/L (ref 135–145)

## 2021-06-24 LAB — HEMOGLOBIN AND HEMATOCRIT, BLOOD
HCT: 24.9 % — ABNORMAL LOW (ref 39.0–52.0)
HCT: 25.2 % — ABNORMAL LOW (ref 39.0–52.0)
Hemoglobin: 7.7 g/dL — ABNORMAL LOW (ref 13.0–17.0)
Hemoglobin: 7.9 g/dL — ABNORMAL LOW (ref 13.0–17.0)

## 2021-06-24 LAB — CBC
HCT: 24.7 % — ABNORMAL LOW (ref 39.0–52.0)
Hemoglobin: 7.5 g/dL — ABNORMAL LOW (ref 13.0–17.0)
MCH: 26.6 pg (ref 26.0–34.0)
MCHC: 30.4 g/dL (ref 30.0–36.0)
MCV: 87.6 fL (ref 80.0–100.0)
Platelets: 284 10*3/uL (ref 150–400)
RBC: 2.82 MIL/uL — ABNORMAL LOW (ref 4.22–5.81)
RDW: 18.8 % — ABNORMAL HIGH (ref 11.5–15.5)
WBC: 6.8 10*3/uL (ref 4.0–10.5)
nRBC: 0 % (ref 0.0–0.2)

## 2021-06-24 MED ORDER — FUROSEMIDE 10 MG/ML IJ SOLN
60.0000 mg | Freq: Once | INTRAMUSCULAR | Status: AC
  Administered 2021-06-24: 60 mg via INTRAVENOUS
  Filled 2021-06-24: qty 6

## 2021-06-24 NOTE — Progress Notes (Signed)
Family Medicine Teaching Service Daily Progress Note Intern Pager: [phone]  Patient name: Rodney [name] Medical record number: [id number] Date of birth: 20-Nov-1950 Age: 71 y.o. Gender: male  Primary Care Provider: [name], MD Consultants: Vascular Surgery, PCCM Code Status: Full  Pt Overview and Major Events to Date:  Rodney [name] is a 71 y.o. male presenting with multiple weeks of nonbloody nonwatery diarrhea found to be C Diff positive. PMH is significant for type 2 diabetes, obesity, tobacco abuse, hypertension, protein calorie malnutrition, aortic stenosis, anemia of chronic disease, PVD, HFrEF, PAF, s/p BKA, CAD, CKD stage III, COPD  Assessment and Plan:  Acute on Chronic Hypoxic Respiratory Failure Persistent Left pleural effusion COPD Patient with acute decompensation yesterday requiring BiPAP support. CXR showing volume overload. Improved after IV Lasix 40mg  x1 but only about 1L UOP charted. Still requiring 3.5-4L [place], up from home requirement of 2-2.5L. Cr stable at 1.47>1.44. Echo with intact LVEF, indeterminate diastolic functino, but with IVC with <50% respiratory variability suggesting fluid overload.  CCM mentioned possibility of draining pleural effusion, but patient would require 5 day Plavix washout. Given that he is on Plavix for a stent placed 1 month ago, believe risks outweigh benefits at this time.  - Will give IV Lasix 60mg  x1 this morning, monitor UOP - Strict I/O - Daily weights - Continue q4h Duonebs - Breo + Incruse  C Difficile Colitis, improving Remains afebrile and without leukocytosis. Day 5 antibiotic therapy. - Continue oral fixaxomicin (5/10)  Anemia of Chronic Disease with history of requiring transfusion Hgb this am 7.5. Threshold <8.0 due to CAD/PAD. No evidence of ongoing blood loss. - STAT H&H and type and screen, if still low, will transfuse 1 u PRBC - Holding lovenox - Resume home Iron supplementation on discharge  AKI on  CKD IIIa Cr 1.47>1.44. - Continue to monitor  Chronic, stable conditions T2DM- Levemir 13u, sSSI Paroxysmal A-fib-amiodarone 200 mg daily GERD-Protonix 40mg  twice daily Insomnia-trazodone 25-50 mg nightly Tobacco use-nicotine patch  FEN/GI: Carb modified PPx: Holding lovenox in setting of anemia Dispo:Home tomorrow. Barriers include blood administration, further diuresis.   Subjective:  Mr. Brummet reports feeling "like he needs a breathing treatment" this morning.  He does report that he is overall improved compared to yesterday but has persistent chest tightness and shortness of breath. Objective: Temp:  [97.6 F (36.4 C)-99.5 F (37.5 C)] 99.5 F (37.5 C) (02/27 2346) Pulse Rate:  [71-103] 72 (02/28 0400) Resp:  [20-36] 21 (02/27 2346) BP: (112-142)/(54-69) 112/54 (02/27 2346) SpO2:  [96 %-100 %] 99 % (02/28 0400) FiO2 (%):  [50 %] 50 % (02/27 0847) Physical Exam: General: Awake, alert, receiving breathing treatment Cardiovascular: Regular rate, regular rhythm, systolic murmur unchanged from previous Respiratory: Moderate air movement, no wheeze however note the patient was actively getting breathing treatment, diminished sounds in left base Abdomen: Nontender and nondistended Extremities: RLE ischemic changes stable  Laboratory: Recent Labs  Lab 06/21/21 0155 06/22/21 0035 06/22/21 0806 06/23/21 0151 06/23/21 0409  WBC 7.7 6.0  --  5.5  --   HGB 8.3* 7.7* 8.2* 7.9* 8.1*  HCT 27.0* 24.7* 26.8* 25.5* 26.3*  PLT 275 274  --  291  --    Recent Labs  Lab 06/19/21 1717 06/20/21 0457 06/22/21 0035 06/23/21 0151 06/23/21 1837  NA 133*   < > 134* 135 134*  K 4.4   < > 4.4 4.7 4.6  CL 98   < > 101 101 100  CO2 28   < >  27 27 28   BUN 27*   < > 29* 26* 27*  CREATININE 1.88*   < > 1.65* 1.47* 1.44*  CALCIUM 7.6*   < > 7.6* 8.0* 8.0*  PROT 5.1*  --   --   --   --   BILITOT 0.5  --   --   --   --   ALKPHOS 82  --   --   --   --   ALT 25  --   --   --   --   AST  18  --   --   --   --   GLUCOSE 106*   < > 178* 163* 236*   < > = values in this interval not displayed.    Imaging/Diagnostic Tests: ECHOCARDIOGRAM COMPLETE    ECHOCARDIOGRAM REPORT       Patient Name:   Rodney [name] Date of Exam: 06/23/2021 Medical Rec #:  [id number]         Height:       72.0 in Accession #:    [id number]        Weight:       270.3 lb Date of Birth:  10/14/1950        BSA:          2.421 m Patient Age:    13 years          BP:           142/63 mmHg Patient Gender: M                 HR:           82 bpm. Exam Location:  Inpatient  Procedure: 2D Echo  Indications:    Congestive Heart Failure I50.9   History:        Patient has prior history of Echocardiogram examinations, most                 recent 03/24/2021. CAD, COPD, Aortic Valve Disease,                 Arrythmias:Atrial Fibrillation; Risk Factors:Hypertension,                 Dyslipidemia and Diabetes.   Sonographer:    Bernadene Person RDCS Referring Phys: [place]   1. Left ventricular ejection fraction, by estimation, is 55 to 60%. The left ventricle has normal function. The left ventricle has no regional wall motion abnormalities. There is mild concentric left ventricular hypertrophy. Left ventricular diastolic  parameters are indeterminate.  2. Right ventricular systolic function is normal. The right ventricular size is normal. There is mildly elevated pulmonary artery systolic pressure.  3. Left atrial size was mildly dilated.  4. Right atrial size was mildly dilated.  5. The mitral valve is grossly normal. Mild mitral valve regurgitation. Moderate mitral annular calcification.  6. The aortic valve is calcified. There is severe calcifcation of the aortic valve. There is severe thickening of the aortic valve. Aortic valve regurgitation is trivial. Severe aortic valve stenosis. Aortic valve area, by VTI measures 0.75 cm. Aortic  valve mean gradient measures 43.0 mmHg.  Aortic valve Vmax measures 4.30 m/s. DI 0.22  7. Aortic dilatation noted. There is borderline dilatation of the aortic root, measuring 36 mm. There is mild dilatation of the ascending aorta, measuring 38 mm.  8. The inferior vena cava is dilated in size with <50% respiratory variability, suggesting right atrial pressure  of 15 mmHg.  9. A small pericardial effusion is present. The pericardial effusion is posterior and lateral to the left ventricle.  Comparison(s): Compared to prior TTE in 02/2021, there continues to be severe aortic stenosis with increase in mean gradient from 23mmHg on previous study to 35mmHg on current study. EF appears slightly better on current study from 50-55% to 55-60%.  FINDINGS  Left Ventricle: Left ventricular ejection fraction, by estimation, is 55 to 60%. The left ventricle has normal function. The left ventricle has no regional wall motion abnormalities. The left ventricular internal cavity size was normal in size. There is  mild concentric left ventricular hypertrophy. Left ventricular diastolic parameters are indeterminate.  Right Ventricle: The right ventricular size is normal. No increase in right ventricular wall thickness. Right ventricular systolic function is normal. There is mildly elevated pulmonary artery systolic pressure. The tricuspid regurgitant velocity is 2.94  m/s, and with an assumed right atrial pressure of 8 mmHg, the estimated right ventricular systolic pressure is 61.6 mmHg.  Left Atrium: Left atrial size was mildly dilated.  Right Atrium: Right atrial size was mildly dilated.  Pericardium: A small pericardial effusion is present. The pericardial effusion is posterior and lateral to the left ventricle.  Mitral Valve: The mitral valve is grossly normal. There is mild thickening of the mitral valve leaflet(s). There is mild calcification of the mitral valve leaflet(s). Moderate mitral annular calcification. Mild mitral valve  regurgitation.  Tricuspid Valve: The tricuspid valve is normal in structure. Tricuspid valve regurgitation is mild.  Aortic Valve: DI 0.23. The aortic valve is calcified. There is severe calcifcation of the aortic valve. There is severe thickening of the aortic valve. Aortic valve regurgitation is trivial. Severe aortic stenosis is present. Aortic valve mean gradient  measures 43.0 mmHg. Aortic valve peak gradient measures 74.0 mmHg. Aortic valve area, by VTI measures 0.82 cm.  Pulmonic Valve: The pulmonic valve was normal in structure. Pulmonic valve regurgitation is trivial.  Aorta: Aortic dilatation noted. There is borderline dilatation of the aortic root, measuring 36 mm. There is mild dilatation of the ascending aorta, measuring 38 mm.  Venous: The inferior vena cava is dilated in size with less than 50% respiratory variability, suggesting right atrial pressure of 15 mmHg.  IAS/Shunts: The atrial septum is grossly normal.    LEFT VENTRICLE PLAX 2D LVIDd:         5.20 cm      Diastology LVIDs:         3.20 cm      LV e' medial:    9.60 cm/s LV PW:         1.30 cm      LV E/e' medial:  18.5 LV IVS:        1.30 cm      LV e' lateral:   10.20 cm/s LVOT diam:     2.20 cm      LV E/e' lateral: 17.5 LV SV:         71 LV SV Index:   30 LVOT Area:     3.80 cm   LV Volumes (MOD) LV vol d, MOD A2C: 130.0 ml LV vol d, MOD A4C: 89.0 ml LV vol s, MOD A2C: 63.1 ml LV vol s, MOD A4C: 35.6 ml LV SV MOD A2C:     66.9 ml LV SV MOD A4C:     89.0 ml LV SV MOD BP:      63.7 ml  RIGHT VENTRICLE RV S prime:  13.10 cm/s TAPSE (M-mode): 2.8 cm  LEFT ATRIUM             Index        RIGHT ATRIUM           Index LA diam:        5.10 cm 2.11 cm/m   RA Area:     23.60 cm LA Vol (A2C):   87.6 ml 36.18 ml/m  RA Volume:   71.00 ml  29.32 ml/m LA Vol (A4C):   84.2 ml 34.77 ml/m LA Biplane Vol: 87.2 ml 36.01 ml/m  AORTIC VALVE AV Area (Vmax):    0.82 cm AV Area (Vmean):   0.79 cm AV  Area (VTI):     0.82 cm AV Vmax:           430.00 cm/s AV Vmean:          299.667 cm/s AV VTI:            0.872 m AV Peak Grad:      74.0 mmHg AV Mean Grad:      43.0 mmHg LVOT Vmax:         93.13 cm/s LVOT Vmean:        62.267 cm/s LVOT VTI:          0.188 m LVOT/AV VTI ratio: 0.22   AORTA Ao Root diam: 3.60 cm Ao Asc diam:  3.80 cm  MITRAL VALVE                  TRICUSPID VALVE MV Area (PHT): 5.38 cm       TR Peak grad:   34.6 mmHg MV Decel Time: 141 msec       TR Vmax:        294.00 cm/s MR Peak grad:    121.9 mmHg MR Mean grad:    75.0 mmHg    SHUNTS MR Vmax:         552.00 cm/s  Systemic VTI:  0.19 m MR Vmean:        406.0 cm/s   Systemic Diam: 2.20 cm MR PISA:         0.25 cm MR PISA Eff ROA: 2 mm MR PISA Radius:  0.20 cm MV E velocity: 178.00 cm/s MV A velocity: 57.00 cm/s MV E/A ratio:  3.12  Gwyndolyn Kaufman MD Electronically signed by Gwyndolyn Kaufman MD Signature Date/Time: 06/23/2021/4:19:19 PM      Final   VAS [place] ABI WITH/WO TBI  LOWER EXTREMITY DOPPLER STUDY  Patient Name:  LONDON TARNOWSKI  Date of Exam:   06/22/2021 Medical Rec #: [id number]          Accession #:    [id number] Date of Birth: 1950-05-10         Patient Gender: M Patient Age:   43 years Exam Location:  [facility] Procedure:      VAS [place] ABI WITH/WO TBI Referring Phys: Monica Martinez  --------------------------------------------------------------------------------   Indications: Ulceration, and peripheral artery disease.  High Risk         Hypertension, hyperlipidemia, Diabetes, coronary artery Factors:          disease.  Other Factors: Left BKA.  Vascular Interventions: 05/12/21 SFA stent.  Comparison Study: Prior ABI done 05/01/21  Performing Technologist: Sharion Dove RVS    Examination Guidelines: A complete evaluation includes at minimum, Doppler waveform signals and systolic blood pressure reading at the level of bilateral brachial, anterior tibial, and  posterior tibial arteries,  when vessel segments are accessible. Bilateral testing is considered an integral part of a complete examination. Photoelectric Plethysmograph (PPG) waveforms and toe systolic pressure readings are included as required and additional duplex testing as needed. Limited examinations for reoccurring indications may be performed as noted.    ABI Findings: +---------+------------------+-----+-----------+----------+  Right     Rt Pressure (mmHg) Index Waveform    Comment     +---------+------------------+-----+-----------+----------+  Brachial  137                      multiphasic             +---------+------------------+-----+-----------+----------+  PTA       254                1.85  monophasic              +---------+------------------+-----+-----------+----------+  DP        159                1.16  multiphasic             +---------+------------------+-----+-----------+----------+  Great Toe                                      amputation  +---------+------------------+-----+-----------+----------+  +---------+------------------+-----+-----------+-------+  Left      Lt Pressure (mmHg) Index Waveform    Comment  +---------+------------------+-----+-----------+-------+  Brachial  132                      multiphasic          +---------+------------------+-----+-----------+-------+  PTA                                            BKA      +---------+------------------+-----+-----------+-------+  DP                                             BKA      +---------+------------------+-----+-----------+-------+  Great Toe                                      BKA      +---------+------------------+-----+-----------+-------+  +-------+-----------+-----------+------------+------------+  ABI/TBI Today's ABI Today's TBI Previous ABI Previous TBI  +-------+-----------+-----------+------------+------------+  Right   non comp    amputation  non comp     amputation     +-------+-----------+-----------+------------+------------+  Left    BKA                     BKA                        +-------+-----------+-----------+------------+------------+  Right ABIs appear essentially unchanged compared to prior study on 05/12/21.   Summary: Right: Resting right ankle-brachial index indicates noncompressible right lower extremity arteries.  Left:  BKA.   *See table(s) above for measurements and observations.    Electronically signed by Servando Snare MD on 06/23/2021 at 2:39:40 PM.       Final   VAS [place] LOWER EXTREMITY ARTERIAL DUPLEX LOWER  EXTREMITY ARTERIAL DUPLEX STUDY  Patient Name:  DARRIEN BELTER  Date of Exam:   06/22/2021 Medical Rec #: [id number]          Accession #:    [id number] Date of Birth: 1951/03/01         Patient Gender: M Patient Age:   71 years Exam Location:  [facility] Procedure:      VAS [place] LOWER EXTREMITY ARTERIAL DUPLEX Referring Phys: Monica Martinez  --------------------------------------------------------------------------------   Indications: Ulceration, and peripheral artery disease.  High Risk Factors: Hypertension, hyperlipidemia, Diabetes, coronary artery                    disease.   Vascular Interventions: 05/12/21 Right SFA stent. Current ABI:            R: non comp L: BKA  Performing Technologist: Sharion Dove RVS    Examination Guidelines: A complete evaluation includes B-mode imaging, spectral Doppler, color Doppler, and power Doppler as needed of all accessible portions of each vessel. Bilateral testing is considered an integral part of a complete examination. Limited examinations for reoccurring indications may be performed as noted.     +----------+--------+-----+--------+-----------+--------+  RIGHT      PSV cm/s Ratio Stenosis Waveform    Comments  +----------+--------+-----+--------+-----------+--------+  CFA Prox   104                     multiphasic            +----------+--------+-----+--------+-----------+--------+  POP Prox   70                      monophasic            +----------+--------+-----+--------+-----------+--------+  POP Distal 144                     monophasic            +----------+--------+-----+--------+-----------+--------+  TP Trunk   194                     multiphasic           +----------+--------+-----+--------+-----------+--------+  ATA Distal 78                      monophasic            +----------+--------+-----+--------+-----------+--------+  PTA Distal 73                      monophasic            +----------+--------+-----+--------+-----------+--------+    Right Stent(s): +---------------+---++-----------++  Prox to Stent   104  multiphasic   +---------------+---++-----------++  Proximal Stent  96   multiphasic   +---------------+---++-----------++  Mid Stent       62   monophasic    +---------------+---++-----------++  Distal Stent    91   monophasic    +---------------+---++-----------++  Distal to Stent 59   monophasic    +---------------+---++-----------++           Summary: Right: Patent SFA stent.    See table(s) above for measurements and observations.  Electronically signed by Servando Snare MD on 06/23/2021 at 2:39:27 PM.       Final   DG CHEST PORT 1 VIEW CLINICAL DATA:  Increased shortness of breath.  EXAM: PORTABLE CHEST 1 VIEW  COMPARISON:  06/20/2021  FINDINGS: Stable cardiac enlargement. Increase in pulmonary  vascularity is consistent with pulmonary edema/congestive heart failure. Stable left pleural effusion. No pneumothorax.  IMPRESSION: Increase in pulmonary edema/congestive heart failure. Stable appearance of left pleural effusion.  Electronically Signed   By: Aletta Edouard M.D.   On: 06/23/2021 08:18    Eppie Gibson, MD 06/24/2021, 5:48 AM PGY-1, [place] Intern pager: [phone], text pages welcome

## 2021-06-24 NOTE — Progress Notes (Signed)
NAME:  Rodney [name], MRN:  [id number], DOB:  Feb 22, 1951, LOS: 5 ADMISSION DATE:  06/19/2021, CONSULTATION DATE:  06/23/2021 REFERRING MD:  Larae Grooms, CHIEF COMPLAINT:  Acute on Chronic Respiratory Distress   History of Present Illness:  Rodney [name] is a 71 y.o. male former smoker with a 50 pack year smoking history quit 04/09/2021, and chronic pleural effusion presenting to [facility] 06/19/2021 with multiple weeks of nonbloody nonwatery diarrhea found to be C Diff positive. PMH is significant for COPD. He wears 2 L [place] at baseline, type 2 diabetes, obesity, tobacco abuse, hypertension, protein calorie malnutrition, aortic stenosis, anemia of chronic disease, PVD, HFrEF, PAF, s/p BKA, CAD, CKD stage III.  Pt. Developed worsening respiratory distress the morning of 06/23/2021, with oxygen sats dropping to the 70's on 3 L. Pt. Was placed on BiPAP and PCCM has been asked to assist with care .    Pertinent  Medical History  As noted above  Significant Hospital Events: Including procedures, antibiotic start and stop dates in addition to other pertinent events   06/19/2021 Admission  2/27 PCCM consult respiraotry distress due to pulmonary edema 2/28 back to baseline after diuresis  Interim History / Subjective:  Weaned off BiPAP yday afternoon. Back to baseline O2.  Objective   Blood pressure 124/70, pulse 80, temperature 97.9 F (36.6 C), temperature source Oral, resp. rate 13, height 6' (1.829 m), weight 124.4 kg, SpO2 96 %.        Intake/Output Summary (Last 24 hours) at 06/24/2021 1234 Last data filed at 06/23/2021 1608 Gross per 24 hour  Intake --  Output 1075 ml  Net -1075 ml    Filed Weights   06/21/21 0500 06/23/21 0223 06/24/21 0659  Weight: 121.9 kg 122.6 kg 124.4 kg    Examination: General: Elderly obese male , lying in bed HENT:  Thick neck, bearded, no LAD, No visible JVD Lungs:  Distant clear, NWOB Cardiovascular: S1, S2, Irregular Abdomen:  Obese, NT, ND, BS +, Body  mass index is 37.2 kg/m. Neuro: Awake and alert, oriented x 3  Resolved Hospital Problem list   Diarrhea is better than on admission   Assessment & Plan:  Acute on Chronic Hypoxemic Respiratory Failure 2/2 Heart Failure and Pulmonary Edema: Uses 2-3L at home.  --IV lasix as BP and kidney function allows --strict I/O, daily weight --O2 sat goal 88%   Chronic L pleural effusion: present since 10/2020, small and loculated laterally. On plavix for recent LE stent for PAD.  --Consider CT chest ONLY ONCE THOROUGHLY DIURESED AND DRY --Needs 6 months dual antiplatelet for LE stent placed 04/2021 so high risk for bleeding, would not recommend thoracentesis at this time  PCCM will sign off  Best Practice (right click and "Reselect all SmartList Selections" daily)  Per [facility]  Labs   CBC: Recent Labs  Lab 06/19/21 1717 06/20/21 0457 06/21/21 0155 06/22/21 0035 06/22/21 0806 06/23/21 0151 06/23/21 0409 06/24/21 0535 06/24/21 0630  WBC 11.7* 10.1 7.7 6.0  --  5.5  --  6.8  --   NEUTROABS 9.6*  --   --   --   --   --   --   --   --   HGB 9.2* 8.8* 8.3* 7.7* 8.2* 7.9* 8.1* 7.5* 7.7*  HCT 29.7* 28.0* 27.0* 24.7* 26.8* 25.5* 26.3* 24.7* 24.9*  MCV 87.1 87.0 87.4 87.3  --  87.3  --  87.6  --   PLT 269 277 275 274  --  291  --  284  --      Basic Metabolic Panel: Recent Labs  Lab 06/21/21 0155 06/22/21 0035 06/23/21 0151 06/23/21 1837 06/24/21 0535  NA 136 134* 135 134* 135  K 4.7 4.4 4.7 4.6 4.7  CL 101 101 101 100 99  CO2 30 27 27 28 30   GLUCOSE 140* 178* 163* 236* 157*  BUN 27* 29* 26* 27* 26*  CREATININE 1.62* 1.65* 1.47* 1.44* 1.41*  CALCIUM 7.8* 7.6* 8.0* 8.0* 7.9*    GFR: Estimated Creatinine Clearance: 67.3 mL/min (A) (by C-G formula based on SCr of 1.41 mg/dL (H)). Recent Labs  Lab 06/21/21 0155 06/22/21 0035 06/23/21 0151 06/24/21 0535  WBC 7.7 6.0 5.5 6.8     Liver Function Tests: Recent Labs  Lab 06/19/21 1717  AST 18  ALT 25  ALKPHOS 82  BILITOT  0.5  PROT 5.1*  ALBUMIN 2.0*    No results for input(s): LIPASE, AMYLASE in the last 168 hours. No results for input(s): AMMONIA in the last 168 hours.  ABG    Component Value Date/Time   PHART 7.297 (L) 03/24/2021 0328   PCO2ART 50.0 (H) 03/24/2021 0328   PO2ART 370 (H) 03/24/2021 0328   HCO3 23.0 03/24/2021 0328   TCO2 26 01/26/2021 0615   ACIDBASEDEF 2.3 (H) 03/24/2021 0328   O2SAT 99.9 03/24/2021 0328      Coagulation Profile: No results for input(s): INR, PROTIME in the last 168 hours.  Cardiac Enzymes: No results for input(s): CKTOTAL, CKMB, CKMBINDEX, TROPONINI in the last 168 hours.  HbA1C: Hemoglobin A1C  Date/Time Value Ref Range Status  07/03/2020 03:58 PM 6.5 (A) 4.0 - 5.6 % Final   HbA1c, POC (controlled diabetic range)  Date/Time Value Ref Range Status  10/14/2020 11:30 AM 6.1 0.0 - 7.0 % Final  09/21/2019 02:51 PM 8.7 (A) 0.0 - 7.0 % Final   Hgb A1c MFr Bld  Date/Time Value Ref Range Status  03/24/2021 07:03 AM 6.9 (H) 4.8 - 5.6 % Final    Comment:    (NOTE)         Prediabetes: 5.7 - 6.4         Diabetes: >6.4         Glycemic control for adults with diabetes: <7.0   04/03/2020 06:30 PM 7.5 (H) 4.8 - 5.6 % Final    Comment:    (NOTE) Pre diabetes:          5.7%-6.4%  Diabetes:              >6.4%  Glycemic control for   <7.0% adults with diabetes     CBG: Recent Labs  Lab 06/23/21 0849 06/23/21 1244 06/23/21 1647 06/23/21 2342 06/24/21 0854  GLUCAP 185* 151* 225* 164* 149*     Review of Systems:   + Shortness of breath   Past Medical History:  He,  has a past medical history of Acute combined systolic and diastolic congestive heart failure (HCC), Acute on chronic heart failure with preserved ejection fraction (HFpEF) ([place]), Acute respiratory failure with hypoxia ([place]), Aortic stenosis, Atrial fibrillation ([place]), CHF (congestive heart failure) ([place]), COPD exacerbation ([place]) (12/09/2020), Coronary artery disease, Demand ischemia  ([place]) (03/26/2021), Diabetes mellitus without complication ([place]), HLD (hyperlipidemia), Hypertension, Long term (current) use of anticoagulants (12/29/2019), Malnutrition of moderate degree (05/09/2021), Peripheral arterial disease ([place]), and Pneumonia due to COVID-19 virus (05/29/2021).   Surgical History:   Past Surgical History:  Procedure Laterality Date   ABDOMINAL AORTOGRAM W/LOWER EXTREMITY N/A 08/05/2020   Procedure:  ABDOMINAL AORTOGRAM W/LOWER EXTREMITY;  Surgeon: Marty Heck, MD;  Location: [place] [facility];  Service: Cardiovascular;  Laterality: N/A;   ABDOMINAL AORTOGRAM W/LOWER EXTREMITY N/A 11/13/2020   Procedure: ABDOMINAL AORTOGRAM W/LOWER EXTREMITY;  Surgeon: Cherre Robins, MD;  Location: [place] [facility];  Service: Cardiovascular;  Laterality: N/A;   ABDOMINAL AORTOGRAM W/LOWER EXTREMITY N/A 05/12/2021   Procedure: ABDOMINAL AORTOGRAM W/LOWER EXTREMITY;  Surgeon: Waynetta Sandy, MD;  Location: [place] [facility];  Service: Cardiovascular;  Laterality: N/A;   AMPUTATION Left 09/28/2019   Procedure: AMPUTATION BELOW KNEE;  Surgeon: Newt Minion, MD;  Location: [place];  Service: Orthopedics;  Laterality: Left;   AMPUTATION Right 11/15/2020   Procedure: RIGHT GREAT TOE AMPUTATION;  Surgeon: Newt Minion, MD;  Location: [place];  Service: Orthopedics;  Laterality: Right;   CARDIAC CATHETERIZATION     CARDIOVERSION N/A 10/05/2019   Procedure: CARDIOVERSION;  Surgeon: Sanda Klein, MD;  Location: [place] [facility];  Service: Cardiovascular;  Laterality: N/A;   FEMORAL-POPLITEAL BYPASS GRAFT Right 08/07/2020   Procedure: RIGHT FEMORAL TO BELOW KNEE POPLITEAL ARTERY BYPASS;  Surgeon: Waynetta Sandy, MD;  Location: [place];  Service: Vascular;  Laterality: Right;   LEFT HEART CATH AND CORONARY ANGIOGRAPHY N/A 10/03/2019   Procedure: LEFT HEART CATH AND CORONARY ANGIOGRAPHY;  Surgeon: Lorretta Harp, MD;  Location: [place] [facility];  Service: Cardiovascular;   Laterality: N/A;   PERIPHERAL VASCULAR INTERVENTION Right 08/05/2020   Procedure: PERIPHERAL VASCULAR INTERVENTION;  Surgeon: Marty Heck, MD;  Location: [place] [facility];  Service: Cardiovascular;  Laterality: Right;  common Iliac   PERIPHERAL VASCULAR INTERVENTION Left 11/13/2020   Procedure: PERIPHERAL VASCULAR INTERVENTION;  Surgeon: Cherre Robins, MD;  Location: [place] [facility];  Service: Cardiovascular;  Laterality: Left;   PERIPHERAL VASCULAR INTERVENTION Right 11/14/2020   Procedure: PERIPHERAL VASCULAR INTERVENTION;  Surgeon: Marty Heck, MD;  Location: [place] [facility];  Service: Cardiovascular;  Laterality: Right;  POP/SFA STENT   PERIPHERAL VASCULAR INTERVENTION  05/12/2021   Procedure: PERIPHERAL VASCULAR INTERVENTION;  Surgeon: Waynetta Sandy, MD;  Location: [place] [facility];  Service: Cardiovascular;;   TEE WITHOUT CARDIOVERSION N/A 10/05/2019   Procedure: TRANSESOPHAGEAL ECHOCARDIOGRAM (TEE);  Surgeon: Sanda Klein, MD;  Location[facility];  Service: Cardiovascular;  Laterality: N/A;     Social History:   reports that he has quit smoking. His smoking use included cigarettes. He started smoking about 2 months ago. He has a 25.00 pack-year smoking history. He has never used smokeless tobacco. He reports current alcohol use of about 6.0 standard drinks per week. He reports that he does not use drugs.   Family History:  His family history includes Alcoholism in his father and mother.   Allergies No Known Allergies   Home Medications  Prior to Admission medications   Medication Sig Start Date End Date Taking? Authorizing Provider  acetaminophen (TYLENOL) 650 MG CR tablet Take 1,300 mg by mouth in the morning and at bedtime.   Yes [provider]  albuterol (VENTOLIN HFA) 108 (90 Base) MCG/ACT inhaler INHALE 2 PUFFS INTO THE LUNGS EVERY 6 (SIX) HOURS AS NEEDED FOR WHEEZING OR SHORTNESS OF BREATH. 04/24/21  Yes Hensel, Jamal Collin,  MD  amiodarone (PACERONE) 200 MG tablet Take 1 tablet (200 mg total) by mouth daily. 10/14/20  Yes Hensel, Jamal Collin, MD  atorvastatin (LIPITOR) 80 MG tablet TAKE 1 TABLET EVERY DAY Patient taking differently: Take 80 mg by mouth  daily. 03/11/21  Yes O'Neal, Cassie Freer, MD  carvedilol (COREG) 12.5 MG tablet Take 6.25 mg by mouth 2 (two) times daily with a meal. 12/18/20  Yes [provider]  clopidogrel (PLAVIX) 75 MG tablet TAKE 1 TABLET EVERY DAY WITH BREAKFAST Patient taking differently: Take 75 mg by mouth daily. 03/17/21  Yes Hensel, Jamal Collin, MD  ferrous sulfate 325 (65 FE) MG tablet Take 1 tablet (325 mg total) by mouth every other day. Patient taking differently: Take 325 mg by mouth daily with breakfast. 05/18/21  Yes Paige, Victoria J, DO  fluticasone (FLONASE) 50 MCG/ACT nasal spray Place 2 sprays into both nostrils daily. 02/13/21  Yes Simmons-Robinson, Makiera, MD  Fluticasone-Umeclidin-Vilant (TRELEGY ELLIPTA) 100-62.5-25 MCG/ACT AEPB Inhale 1 puff into the lungs daily. 06/01/21  Yes Eppie Gibson, MD  furosemide (LASIX) 40 MG tablet Take 1 tablet (40 mg total) by mouth daily as needed for fluid (swelling). Patient taking differently: Take 40 mg by mouth daily. 02/13/21  Yes Simmons-Robinson, Makiera, MD  gabapentin (NEURONTIN) 300 MG capsule Take 600 mg by mouth at bedtime.   Yes [provider]  insulin detemir (LEVEMIR) 100 UNIT/ML FlexPen Inject 25 Units into the skin daily. 05/16/21  Yes Paige, Victoria J, DO  lactose free nutrition (BOOST) LIQD Take 237 mLs by mouth 3 (three) times a week.   Yes [provider]  pantoprazole (PROTONIX) 40 MG tablet Take 1 tablet (40 mg total) by mouth 2 (two) times daily. 05/16/21 06/20/21 Yes Paige, Weldon Picking, DO  traZODone (DESYREL) 50 MG tablet Take 0.5-1 tablets (25-50 mg total) by mouth at bedtime as needed for sleep. 05/19/21  Yes Hensel, Jamal Collin, MD  Blood Glucose Monitoring Suppl (TRUE METRIX METER) DEVI Use  to test blood sugar three times daily. Patient not taking: Reported on 05/07/2021 11/20/19   [name], MD  Blood Glucose Monitoring Suppl (TRUE METRIX METER) w/Device KIT USE AS DIRECTED Patient not taking: Reported on 05/07/2021 03/25/20   [name], MD  glucose blood (RELION TRUE METRIX TEST STRIPS) test strip Use to test blood sugar three times per day. Patient not taking: Reported on 05/07/2021 11/20/19   [name], MD  Insulin Pen Needle 32G X 4 MM MISC Use to inject insulin up to 4 times daily as directed, 05/16/21     ipratropium-albuterol (DUONEB) 0.5-2.5 (3) MG/3ML SOLN Take 3 mLs by nebulization every 4 (four) hours as needed. 05/19/21   [name], MD  metFORMIN (GLUCOPHAGE) 1000 MG tablet Take 1 tablet (1,000 mg total) by mouth daily. 04/07/21   Lavina Hamman, MD  TRUEplus Lancets 33G MISC Use to test blood sugar three times per day. Patient not taking: Reported on 05/07/2021 11/20/19   [name], MD     Critical care time:     Lanier Clam, MD See Amion for contact info 06/24/2021 12:34 PM

## 2021-06-24 NOTE — Progress Notes (Signed)
OT Cancellation Note  Patient Details Name: Rodney [name] MRN: [id number] DOB: 05-Jun-1950   Cancelled Treatment:    Reason Eval/Treat Not Completed: Other (comment) Attempted to see pt, pt reporting 10/10 pain at IV site, stating "I am not getting up today so don't bother coming back" despite education on importance of mobility, provided option for therapist to come back at a later time this afternoon. OT will re-attempt as able.   Shanon Payor, OTD OTR/L  06/24/21, 2:30 PM

## 2021-06-24 NOTE — Progress Notes (Signed)
PT Cancellation Note  Patient Details Name: Rodney [name] MRN: [id number] DOB: 04/25/1951   Cancelled Treatment:    Reason Eval/Treat Not Completed: Patient adamantly refusing activity this afternoon.    [name], PT Acute Rehabilitation Services  Pager [phone] Office [phone]    [name] 06/24/2021, 3:22 PM

## 2021-06-24 NOTE — Progress Notes (Signed)
FPTS Brief Progress Note  S:Patient feels alright at this time. He does note pain in his left antecubital region due to his IV and that there is a big painful bruise there. He has no other complaints at this tim   O: BP (!) 118/58 (BP Location: Left Arm)    Pulse 71    Temp 98 F (36.7 C) (Oral)    Resp 20    Ht 6' (1.829 m)    Wt 124.4 kg    SpO2 99%    BMI 37.20 kg/m   General: NAD, supine in bed, nurse at bedside CV: RRR Respiratory: breathing comfortably on 3.5L O2, speaking in full sentences  A/P: Acute on Chronic Hypoxic Respiratory Failure Persistent Left pleural effusion COPD - Orders reviewed. Labs for AM ordered, which was adjusted as needed.  - Closely monitor respiratory status, supplemental O2 as needed - Continue strict I&Os - Re-dose AM lasix, consider restarting home lasix dose  Rodney Wisdom, DO 06/24/2021, 11:01 PM PGY-2, [facility] Night Resident  Please page [phone] with questions.

## 2021-06-24 NOTE — Progress Notes (Signed)
OT Cancellation Note  Patient Details Name: PRATHIK AMAN MRN: [id number] DOB: April 09, 1951   Cancelled Treatment:    Reason Eval/Treat Not Completed: Patient not medically ready;Other (comment) Per secure chat from RN, blood transfusion should be completed by approx 3 pm. As pt needs blood due to cardiac issues, will attempt eval after transfusion is completed.     Shanon Payor, OTD OTR/L  06/24/21, 12:52 PM

## 2021-06-24 NOTE — Progress Notes (Signed)
VAST consulted to obtain IV access. Pt currently has no IV meds/fluids or studies ordered. Spoke with Desiray, pt's nurse; educated it is best practice not to place an IV that is not currently needed to decrease infection risk and allow for vein preservation. Further educated if pt's condition changes and IV access is needed emergently, IV team consult should be placed STAT with a comment as to why an emergent IV is needed. Desiray, pt's nurse verbalized understanding.

## 2021-06-24 NOTE — Progress Notes (Signed)
PT Cancellation Note  Patient Details Name: Rodney [name] MRN: [id number] DOB: [birth year]   Cancelled Treatment:    Reason Eval/Treat Not Completed: Patient not medically ready  Per RN, blood transfusion started and should be completed by ~3:00 pm. Due to pt needing blood due to cardiac issues, will attempt to see after blood has finished.   [name], PT Acute Rehabilitation Services  Pager [phone] Office [phone]   [name] 06/24/2021, 11:41 AM

## 2021-06-24 NOTE — Progress Notes (Signed)
Pt has new order to transfuse 1 unit RBCs. Pt does not have an IV access. Stat order placed for IV team. Pt is a difficult stick.

## 2021-06-25 DIAGNOSIS — A0472 Enterocolitis due to Clostridium difficile, not specified as recurrent: Secondary | ICD-10-CM | POA: Diagnosis not present

## 2021-06-25 DIAGNOSIS — I5020 Unspecified systolic (congestive) heart failure: Secondary | ICD-10-CM | POA: Diagnosis not present

## 2021-06-25 DIAGNOSIS — E1149 Type 2 diabetes mellitus with other diabetic neurological complication: Secondary | ICD-10-CM | POA: Diagnosis not present

## 2021-06-25 DIAGNOSIS — I10 Essential (primary) hypertension: Secondary | ICD-10-CM | POA: Diagnosis not present

## 2021-06-25 LAB — TYPE AND SCREEN
ABO/RH(D): A POS
Antibody Screen: NEGATIVE
Unit division: 0
Unit division: 0

## 2021-06-25 LAB — BASIC METABOLIC PANEL
Anion gap: 6 (ref 5–15)
BUN: 28 mg/dL — ABNORMAL HIGH (ref 8–23)
CO2: 32 mmol/L (ref 22–32)
Calcium: 8.2 mg/dL — ABNORMAL LOW (ref 8.9–10.3)
Chloride: 99 mmol/L (ref 98–111)
Creatinine, Ser: 1.27 mg/dL — ABNORMAL HIGH (ref 0.61–1.24)
GFR, Estimated: >60 mL/min (ref >60)
Glucose, Bld: 137 mg/dL — ABNORMAL HIGH (ref 70–99)
Potassium: 4.6 mmol/L (ref 3.5–5.1)
Sodium: 137 mmol/L (ref 135–145)

## 2021-06-25 LAB — CBC
HCT: 27.7 % — ABNORMAL LOW (ref 39.0–52.0)
Hemoglobin: 8.8 g/dL — ABNORMAL LOW (ref 13.0–17.0)
MCH: 27.8 pg (ref 26.0–34.0)
MCHC: 31.8 g/dL (ref 30.0–36.0)
MCV: 87.7 fL (ref 80.0–100.0)
Platelets: 282 10*3/uL (ref 150–400)
RBC: 3.16 MIL/uL — ABNORMAL LOW (ref 4.22–5.81)
RDW: 18.3 % — ABNORMAL HIGH (ref 11.5–15.5)
WBC: 6.9 10*3/uL (ref 4.0–10.5)
nRBC: 0 % (ref 0.0–0.2)

## 2021-06-25 LAB — GLUCOSE, CAPILLARY
Glucose-Capillary: 157 mg/dL — ABNORMAL HIGH (ref 70–99)
Glucose-Capillary: 133 mg/dL — ABNORMAL HIGH (ref 70–99)
Glucose-Capillary: 133 mg/dL — ABNORMAL HIGH (ref 70–99)
Glucose-Capillary: 111 mg/dL — ABNORMAL HIGH (ref 70–99)

## 2021-06-25 LAB — BPAM RBC
Blood Product Expiration Date: 202303042359
Blood Product Expiration Date: 202303042359
ISSUE DATE / TIME: 202302281101
ISSUE DATE / TIME: 202302282302
Unit Type and Rh: 6200
Unit Type and Rh: 6200

## 2021-06-25 LAB — MAGNESIUM: Magnesium: 1.7 mg/dL (ref 1.7–2.4)

## 2021-06-25 MED ORDER — IPRATROPIUM-ALBUTEROL 0.5-2.5 (3) MG/3ML IN SOLN
3.0000 mL | Freq: Three times a day (TID) | RESPIRATORY_TRACT | Status: DC | PRN
  Administered 2021-06-27 – 2021-06-30 (×2): 3 mL via RESPIRATORY_TRACT
  Filled 2021-06-25 (×2): qty 3

## 2021-06-25 MED ORDER — FLUTICASONE FUROATE-VILANTEROL 100-25 MCG/ACT IN AEPB
1.0000 | INHALATION_SPRAY | Freq: Every day | RESPIRATORY_TRACT | Status: DC
  Administered 2021-06-26 – 2021-06-30 (×5): 1 via RESPIRATORY_TRACT

## 2021-06-25 MED ORDER — UMECLIDINIUM BROMIDE 62.5 MCG/ACT IN AEPB
1.0000 | INHALATION_SPRAY | Freq: Every day | RESPIRATORY_TRACT | Status: DC
  Administered 2021-06-26 – 2021-06-30 (×5): 1 via RESPIRATORY_TRACT

## 2021-06-25 MED ORDER — FUROSEMIDE 40 MG PO TABS
40.0000 mg | ORAL_TABLET | Freq: Every day | ORAL | Status: DC
Start: 2021-06-25 — End: 2021-06-30
  Administered 2021-06-25 – 2021-06-30 (×6): 40 mg via ORAL
  Filled 2021-06-25 (×6): qty 1

## 2021-06-25 MED ORDER — RIVAROXABAN 10 MG PO TABS
10.0000 mg | ORAL_TABLET | Freq: Every day | ORAL | Status: DC
  Administered 2021-06-25 – 2021-06-27 (×3): 10 mg via ORAL
  Filled 2021-06-25 (×3): qty 1

## 2021-06-25 NOTE — Evaluation (Signed)
Physical Therapy Evaluation ?Patient Details ?Name: Rodney [name] ?MRN: [id number] ?DOB: 04-Jan-1951 ?Today's Date: 06/25/2021 ? ?History of Present Illness ? 70 y.o. male presenting 06/19/21 with multiple weeks of nonbloody nonwatery diarrhea. 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  ?Pt admitted secondary to problem above with deficits below. PTA patient was living with girlfriend and could transfer to his wheelchair without his prosthesis with occasional min assist by girlfriend. Pt currently requires ?amount of assist for transfers as he refused to participate beyond a supine assessment due to RUE pain.  Anticipate patient will benefit from PT to address problems listed below, if pt is found to have had a decline in status. Will continue to follow acutely to maximize functional mobility independence and safety.   ?   ?   ? ?Recommendations for follow up therapy are one component of a multi-disciplinary discharge planning process, led by the attending physician.  Recommendations may be updated based on patient status, additional functional criteria and insurance authorization. ? ?Follow Up Recommendations No PT follow up (anticipate pt will be at/near baseline on discharge) ? ?  ?Assistance Recommended at Discharge Intermittent Supervision/Assistance  ?Patient can return home with the following ? A little help with walking and/or transfers;Help with stairs or ramp for entrance ? ?  ?Equipment Recommendations None recommended by PT  ?Recommendations for Other Services ?    ?  ?Functional Status Assessment Patient has had a recent decline in their functional status and demonstrates the ability to  make significant improvements in function in a reasonable and predictable amount of time.  ? ?  ?Precautions / Restrictions Precautions ?Precautions: Fall ?Required Braces or Orthoses: Other Brace ?Other Brace: left prosthesis  ? ?  ? ?Mobility ? Bed Mobility ?  ?  ?  ?  ?  ?  ?  ?General bed mobility comments: pt refused any mobiltiy due to RUE pain; agreed to LE assessment and exercises ?  ? ?Transfers ?  ?  ?  ?  ?  ?  ?  ?  ?  ?  ?  ? ?Ambulation/Gait ?  ?  ?  ?  ?  ?  ?  ?  ? ?Stairs ?  ?  ?  ?  ?  ? ?Wheelchair Mobility ?  ? ?Modified Rankin (Stroke Patients Only) ?  ? ?  ? ?Balance   ?  ?  ?  ?  ?  ?  ?  ?  ?  ?  ?  ?  ?  ?  ?  ?  ?  ?  ?   ? ? ? ?Pertinent Vitals/Pain Pain Assessment ?Pain Assessment: No/denies pain  ? ? ?Home Living Family/patient expects to be discharged to:: Private residence ?Living Arrangements: Spouse/significant other (girlfriend of 27 years) ?Available Help at Discharge: Friend(s);Available 24 hours/day ?Type of Home: Mobile home ?Home Access: Ramped entrance ?  ?  ?  ?Home Layout: One level ?Home Equipment: Wheelchair - manual;Tub bench;BSC/3in1;Cane - single point;Rolling Walker (2 wheels) ?Additional Comments: most information from prior chart as pt not wanting to answer a bunch of questions  ?  ?Prior Function Prior Level of Function : Needs assist ?  ?  ?  ?  Physical Assist : Mobility (physical) ?Mobility (physical): Transfers ?  ?Mobility Comments: says typically can transfer to w/c without prosthesis independently, however sometimes girlfriend has to assist with gait belt ?  ?  ? ? ?Hand Dominance  ? Dominant Hand: Right ? ?  ?Extremity/Trunk Assessment  ? Upper Extremity Assessment ?Upper Extremity Assessment: Defer to OT evaluation (noted large hematoma RUE antecubital) ?  ? ?Lower Extremity Assessment ?Lower Extremity Assessment: Generalized weakness;RLE deficits/detail;LLE deficits/detail ?RLE Deficits / Details: hip/knee extension ~4/5 (tested supine); foot bandaged due to  sores dorsum of foot, 2nd toe gangrene ?LLE Deficits / Details: prior Lt BKA; strength grossly 4/5 ?  ? ?Cervical / Trunk Assessment ?Cervical / Trunk Assessment: Normal  ?Communication  ? Communication: No difficulties  ?Cognition Arousal/Alertness: Awake/alert ?Behavior During Therapy: Agitated (frustrated at situation) ?Overall Cognitive Status: Within Functional Limits for tasks assessed ?  ?  ?  ?  ?  ?  ?  ?  ?  ?  ?  ?  ?  ?  ?  ?  ?General Comments: cognition not specifically tested as pt getting upset with number of questions being asked ?  ?  ? ?  ?General Comments   ? ?  ?Exercises General Exercises - Lower Extremity ?Quad Sets: AROM, Both, 10 reps ?Gluteal Sets: AROM, Both, 10 reps ?Heel Slides: AROM, Strengthening, Both, 10 reps (resisted extension) ?Hip ABduction/ADduction: AROM, Both, 10 reps, Supine  ? ?Assessment/Plan  ?  ?PT Assessment Patient needs continued PT services  ?PT Problem List Decreased strength;Decreased activity tolerance;Decreased balance;Decreased mobility;Cardiopulmonary status limiting activity;Obesity ? ?   ?  ?PT Treatment Interventions Functional mobility training;Therapeutic activities;Therapeutic exercise;Patient/family education;Wheelchair mobility training   ? ?PT Goals (Current goals can be found in the Care Plan section)  ?Acute Rehab PT Goals ?Patient Stated Goal: get home ?PT Goal Formulation: With patient ?Time For Goal Achievement: 07/09/21 ?Potential to Achieve Goals: Good ? ?  ?Frequency Min 3X/week ?  ? ? ?Co-evaluation   ?  ?  ?  ?  ? ? ?  ?AM-PAC PT "6 Clicks" Mobility  ?Outcome Measure Help needed turning from your back to your side while in a flat bed without using bedrails?: A Little ?Help needed moving from lying on your back to sitting on the side of a flat bed without using bedrails?: A Little ?Help needed moving to and from a bed to a chair (including a wheelchair)?: A Little ?Help needed standing up from a chair using your arms (e.g., wheelchair or  bedside chair)?: Total ?Help needed to walk in hospital room?: Total ?Help needed climbing 3-5 steps with a railing? : Total ?6 Click Score: 12 ? ?  ?End of Session   ?Activity Tolerance: Patient tolerated treatment well ?Patient left: in bed;with call bell/phone within reach ?Nurse Communication: Other (comment) (MD in and turned down O2 from 3.5 to 2.5L) ?PT Visit Diagnosis: Muscle weakness (generalized) (M62.81);Other abnormalities of gait and mobility (R26.89) ?  ? ?Time: 9323-5573 ?PT Time Calculation (min) (ACUTE ONLY): 20 min ? ? ?Charges:   PT Evaluation ?$PT Eval Low Complexity: 1 Low ?  ?  ?   ? ? ? ?[name], PT ?Acute Rehabilitation Services  ?Pager [phone] ?Office [phone] ? ? ?Jeanie Cooks Zayanna Pundt ?06/25/2021, 9:43 AM ? ?

## 2021-06-25 NOTE — Plan of Care (Signed)
?  Problem: Education: ?Goal: Ability to verbalize understanding of medication therapies will improve ?Outcome: Progressing ?  ?Problem: Education: ?Goal: Knowledge of General Education information will improve ?Description: Including pain rating scale, medication(s)/side effects and non-pharmacologic comfort measures ?Outcome: Progressing ?  ?Problem: Clinical Measurements: ?Goal: Diagnostic test results will improve ?Outcome: Progressing ?  ?

## 2021-06-25 NOTE — Progress Notes (Signed)
Family Medicine Teaching Service ?Daily Progress Note ?Intern Pager: [phone] ? ?Patient name: Rodney [name] Medical record number: [id number] ?Date of birth: 1950-06-25 Age: 71 y.o. Gender: male ? ?Primary Care Provider: [name], MD ?Consultants: Vascular surgery, PCCM ?Code Status: Full ? ?Pt Overview and Major Events to Date:  ?Rodney [name] is a 71 y.o. male presenting with multiple weeks of nonbloody nonwatery diarrhea found to be C Diff positive. PMH is significant for type 2 diabetes, obesity, tobacco abuse, hypertension, protein calorie malnutrition, aortic stenosis, anemia of chronic disease, PVD, HFrEF, PAF, s/p BKA, CAD, CKD stage III, COPD. ? ?Assessment and Plan: ?Acute on chronic hypoxic respiratory failure  persistent left pleural effusion  COPD ?During current admission had acute decompensation which required BiPAP support.  CXR showed volume overload and patient has been receiving Lasix most recently Lasix IV 60 mg and was requiring 3.5 L nasal cannula but lowered to 2.5L during encounter.  As previously mentioned by CCM, may drain pleural effusion when patient reaches baseline oxygen requirement but would not be appropriate at this time given he is on Plavix for stent placement 1 month ago which will require a 5-day washout period.  Urine output of 1.2 L yesterday. ?-Strict I's and O's ?-Daily weights ?-Continue every Duonebs q4h prn ?-Transition to home Lasix oral 40mg  ?-Start Breo-Incruse Ellipta (equivalent to home Trelegy) ? ?C. difficile colitis ?Remained afebrile and hemodynamically stable overnight and without leukocytosis at this time.  Day 6/10 of antibiotic therapy. ?-Continue oral fidaxomicin (6/10) ? ?Anemia of chronic disease with history of requiring transfusion ?Threshold<8.0 due to CAD/PAD.  No evidence of ongoing blood loss.  Received 2 units yesterday and hgb this a.m. 8.8. ?-Resume home iron supplementation on discharge ?-monitor CBC ? ?AKI on CKD  3a ?Creatinine improved from 1.44 to 1.27. ?-Continue to monitor BMP ? ? ?Chronic, stable conditions ?T2DM- Levemir 13u, sSSI ?Paroxysmal A-fib-amiodarone 200 mg daily ?GERD-Protonix 40mg  twice daily ?Insomnia-trazodone 25-50 mg nightly ?Tobacco use-nicotine patch ? ?FEN/GI: Carb modified ?PPx: Xarelto 10mg  daily ?Dispo:Home tomorrow. Barriers include monitoring oxygen and fluid status.  ? ?Subjective:  ?Pt states he is doing well and breathing better although still notices his wheeze.  ? ?Objective: ?Temp:  [97.7 ?F (36.5 ?C)-99 ?F (37.2 ?C)] 97.7 ?F (36.5 ?C) (03/01 7253) ?Pulse Rate:  [69-80] 70 (03/01 0600) ?Resp:  [13-24] 19 (03/01 0600) ?BP: (109-146)/(42-71) 132/63 (03/01 0218) ?SpO2:  [92 %-100 %] 98 % (03/01 0804) ?Weight:  [125 kg] 125 kg (03/01 0500) ?Physical Exam: ?General: awake, alert, conversationally appropriate ?Cardiovascular: RRR, systolic murmur appreciated ?Respiratory: good aeration, wheezing on L side and notable without auscultation ?Extremities: +1 pitting edema up to knee of RLE, left BKA ? ?Laboratory: ?Recent Labs  ?Lab 06/23/21 ?0151 06/23/21 ?0409 06/24/21 ?6644 06/24/21 ?0630 06/24/21 ?1629 06/25/21 ?0347  ?WBC 5.5  --  6.8  --   --  6.9  ?HGB 7.9*   < > 7.5* 7.7* 7.9* 8.8*  ?HCT 25.5*   < > 24.7* 24.9* 25.2* 27.7*  ?PLT 291  --  284  --   --  282  ? < > = values in this interval not displayed.  ? ?Recent Labs  ?Lab 06/19/21 ?1717 06/20/21 ?0457 06/23/21 ?1837 06/24/21 ?4259 06/25/21 ?5638  ?NA 133*   < > 134* 135 137  ?K 4.4   < > 4.6 4.7 4.6  ?CL 98   < > 100 99 99  ?CO2 28   < > 28 30 32  ?BUN  27*   < > 27* 26* 28*  ?CREATININE 1.88*   < > 1.44* 1.41* 1.27*  ?CALCIUM 7.6*   < > 8.0* 7.9* 8.2*  ?PROT 5.1*  --   --   --   --   ?BILITOT 0.5  --   --   --   --   ?ALKPHOS 82  --   --   --   --   ?ALT 25  --   --   --   --   ?AST 18  --   --   --   --   ?GLUCOSE 106*   < > 236* 157* 137*  ? < > = values in this interval not displayed.  ? ?Mg 1.7 ? ?Imaging/Diagnostic Tests: ?No results  found. ? ?Wells Guiles, DO ?06/25/2021, 8:09 AM ?PGY-1, [facility] ?[place] Intern pager: [phone], text pages welcome ? ?

## 2021-06-25 NOTE — Progress Notes (Signed)
OT Cancellation Note ? ?Patient Details ?Name: Rodney [name] ?MRN: [id number] ?DOB: 1950/05/17 ? ? ?Cancelled Treatment:    Reason Eval/Treat Not Completed: Pt informed PT that he was not going to work with OT today and requested that OT not attempt.   Will check back late in day if schedule allows, or tomorrow. ? ?Meoshia Billing C., OTR/L ?Acute Rehabilitation Services ?Pager [phone] ?Office [phone] ? ? ?[name] ?06/25/2021, 9:32 AM ?

## 2021-06-26 DIAGNOSIS — N179 Acute kidney failure, unspecified: Secondary | ICD-10-CM | POA: Diagnosis not present

## 2021-06-26 DIAGNOSIS — D638 Anemia in other chronic diseases classified elsewhere: Secondary | ICD-10-CM | POA: Diagnosis not present

## 2021-06-26 DIAGNOSIS — E1122 Type 2 diabetes mellitus with diabetic chronic kidney disease: Secondary | ICD-10-CM

## 2021-06-26 DIAGNOSIS — Z609 Problem related to social environment, unspecified: Secondary | ICD-10-CM

## 2021-06-26 DIAGNOSIS — N183 Chronic kidney disease, stage 3 unspecified: Secondary | ICD-10-CM

## 2021-06-26 DIAGNOSIS — A0472 Enterocolitis due to Clostridium difficile, not specified as recurrent: Secondary | ICD-10-CM | POA: Diagnosis not present

## 2021-06-26 DIAGNOSIS — I480 Paroxysmal atrial fibrillation: Secondary | ICD-10-CM | POA: Diagnosis not present

## 2021-06-26 LAB — BASIC METABOLIC PANEL
Anion gap: 8 (ref 5–15)
BUN: 30 mg/dL — ABNORMAL HIGH (ref 8–23)
CO2: 29 mmol/L (ref 22–32)
Calcium: 8.2 mg/dL — ABNORMAL LOW (ref 8.9–10.3)
Chloride: 97 mmol/L — ABNORMAL LOW (ref 98–111)
Creatinine, Ser: 1.34 mg/dL — ABNORMAL HIGH (ref 0.61–1.24)
GFR, Estimated: 57 mL/min — ABNORMAL LOW (ref >60)
Glucose, Bld: 143 mg/dL — ABNORMAL HIGH (ref 70–99)
Potassium: 4.7 mmol/L (ref 3.5–5.1)
Sodium: 134 mmol/L — ABNORMAL LOW (ref 135–145)

## 2021-06-26 LAB — CBC
HCT: 26.9 % — ABNORMAL LOW (ref 39.0–52.0)
Hemoglobin: 8.4 g/dL — ABNORMAL LOW (ref 13.0–17.0)
MCH: 27.8 pg (ref 26.0–34.0)
MCHC: 31.2 g/dL (ref 30.0–36.0)
MCV: 89.1 fL (ref 80.0–100.0)
Platelets: 300 10*3/uL (ref 150–400)
RBC: 3.02 MIL/uL — ABNORMAL LOW (ref 4.22–5.81)
RDW: 18.4 % — ABNORMAL HIGH (ref 11.5–15.5)
WBC: 6.2 10*3/uL (ref 4.0–10.5)
nRBC: 0 % (ref 0.0–0.2)

## 2021-06-26 LAB — MAGNESIUM: Magnesium: 1.6 mg/dL — ABNORMAL LOW (ref 1.7–2.4)

## 2021-06-26 LAB — GLUCOSE, CAPILLARY
Glucose-Capillary: 140 mg/dL — ABNORMAL HIGH (ref 70–99)
Glucose-Capillary: 169 mg/dL — ABNORMAL HIGH (ref 70–99)
Glucose-Capillary: 168 mg/dL — ABNORMAL HIGH (ref 70–99)
Glucose-Capillary: 154 mg/dL — ABNORMAL HIGH (ref 70–99)

## 2021-06-26 MED ORDER — MAGNESIUM SULFATE 2 GM/50ML IV SOLN
2.0000 g | Freq: Once | INTRAVENOUS | Status: AC
  Administered 2021-06-26: 2 g via INTRAVENOUS
  Filled 2021-06-26: qty 50

## 2021-06-26 NOTE — Progress Notes (Signed)
FPTS Brief Note ?Reviewed patient's vitals, recent notes.  ?Vitals:  ? 06/26/21 1557 06/26/21 2006  ?BP: (!) 109/47 105/71  ?Pulse: 70 70  ?Resp: 20 (!) 24  ?Temp: 98.6 ?F (37 ?C) 98.7 ?F (37.1 ?C)  ?SpO2: 95% 95%  ? ?At this time, no change in plan from day progress note.  ?Zola Button, MD ?Page [phone] with questions about this patient.  ?  ?

## 2021-06-26 NOTE — Progress Notes (Signed)
Physical Therapy Treatment ?Patient Details ?Name: Rodney [name] ?MRN: [id number] ?DOB: 03-28-1951 ?Today's Date: 06/26/2021 ? ? ?History of Present Illness 71 y.o. male presenting 06/19/21 with multiple weeks of nonbloody nonwatery diarrhea. 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 ?  ?PT Comments  ? ? Patient agreed to more mobility today. Due to RUE pain, patient unable to transfer bed to chair. Based on lateral scoot along EOB (very limited), anticipate pt may need 2 person assist (or at least mod assist) to transfer. Based on his functional decline and girlfriend's inability to provide this level of care, recommendation changed for discharge to SNF. Discussed with pt and his agreeable to plan as he now recognizes he is too weak to go home and needs time for his arm to get better and be able to use it more.  ?   ?Recommendations for follow up therapy are one component of a multi-disciplinary discharge planning process, led by the attending physician.  Recommendations may be updated based on patient status, additional functional criteria and insurance authorization. ? ?Follow Up Recommendations ? Skilled nursing-short term rehab (<3 hours/day) ?  ?  ?Assistance Recommended at Discharge Intermittent Supervision/Assistance  ?Patient can return home with the following Help with stairs or ramp for entrance;Two people to help with walking and/or transfers;Assist for transportation ?  ?Equipment Recommendations ? None recommended by PT  ?  ?Recommendations for Other Services   ? ? ?  ?Precautions / Restrictions Precautions ?Precautions: Fall ?Required Braces or Orthoses: Other Brace ?Other Brace: left  prosthesis ?Restrictions ?Weight Bearing Restrictions: No  ?  ? ?Mobility ? Bed Mobility ?Overal bed mobility: Needs Assistance ?Bed Mobility: Rolling, Sidelying to Sit, Sit to Supine ?Rolling: Modified independent (Device/Increase time) (with rail) ?Sidelying to sit: Min assist ?  ?Sit to supine: Supervision ?  ?General bed mobility comments: pt used RUE to pull his torso up to sitting (pulling against PTs hand), while pushing up with his LUE ?  ? ?Transfers ?Overall transfer level: Needs assistance ?Equipment used: None ?Transfers: Bed to chair/wheelchair/BSC ?  ?  ?  ?  ?  ? Lateral/Scoot Transfers: Min assist ?General transfer comment: pt scooted x 2 along EOB towards his left using UEs only (no protective shoe present to go over his Rt foot bandage)--messaged MD to order a post-op shoe; due to right UE pain and generalized weakness anticipate he will require up to mod assist to transfer to chair ?  ? ?Ambulation/Gait ?  ?  ?  ?  ?  ?  ?  ?  ? ? ?Stairs ?  ?  ?  ?  ?  ? ? ?Wheelchair Mobility ?  ? ?Modified Rankin (Stroke Patients Only) ?  ? ? ?  ?Balance Overall balance assessment: Independent (sitting at EOB) ?  ?  ?  ?  ?  ?  ?  ?  ?  ?  ?  ?  ?  ?  ?  ?  ?  ?  ?  ? ?  ?Cognition Arousal/Alertness: Awake/alert ?Behavior During Therapy: [facility] for tasks assessed/performed ?Overall Cognitive Status: Within Functional Limits for tasks assessed ?  ?  ?  ?  ?  ?  ?  ?  ?  ?  ?  ?  ?  ?  ?  ?  ?  ?  ?  ? ?  ?  Exercises   ? ?  ?General Comments   ?  ?  ? ?Pertinent Vitals/Pain Pain Assessment ?Pain Assessment: Faces ?Faces Pain Scale: Hurts even more ?Pain Location: Rt UE ?Pain Descriptors / Indicators: Discomfort, Guarding ?Pain Intervention(s): Limited activity within patient's tolerance, Monitored during session  ? ? ?Home Living   ?  ?  ?  ?  ?  ?  ?  ?  ?  ?   ?  ?Prior Function    ?  ?  ?   ? ?PT Goals (current goals can now be found in the care plan section) Acute Rehab PT Goals ?Patient Stated Goal: get home ?PT  Goal Formulation: With patient ?Time For Goal Achievement: 07/09/21 ?Potential to Achieve Goals: Good ?Progress towards PT goals: Progressing toward goals ? ?  ?Frequency ? ? ? Min 2X/week ? ? ? ?  ?PT Plan Discharge plan needs to be updated;Frequency needs to be updated  ? ? ?Co-evaluation PT/OT/SLP Co-Evaluation/Treatment: Yes ?Reason for Co-Treatment: Other (comment) (patient tolerance for activity) ?PT goals addressed during session: Mobility/safety with mobility;Balance ?  ?  ? ?  ?AM-PAC PT "6 Clicks" Mobility   ?Outcome Measure ? Help needed turning from your back to your side while in a flat bed without using bedrails?: A Little ?Help needed moving from lying on your back to sitting on the side of a flat bed without using bedrails?: A Little ?Help needed moving to and from a bed to a chair (including a wheelchair)?: Total ?Help needed standing up from a chair using your arms (e.g., wheelchair or bedside chair)?: Total ?Help needed to walk in hospital room?: Total ?Help needed climbing 3-5 steps with a railing? : Total ?6 Click Score: 10 ? ?  ?End of Session Equipment Utilized During Treatment: Oxygen ?Activity Tolerance: Patient limited by pain (transfers limited byUE pain) ?Patient left: in bed;with call bell/phone within reach ?  ?PT Visit Diagnosis: Muscle weakness (generalized) (M62.81);Other abnormalities of gait and mobility (R26.89) ?  ? ? ?Time: 0762-2633 ?PT Time Calculation (min) (ACUTE ONLY): 26 min ? ?Charges:  $Therapeutic Activity: 8-22 mins          ?          ? ? ?[name], PT ?Acute Rehabilitation Services  ?Pager [phone] ?Office [phone] ? ? ? ?[name] ?06/26/2021, 11:27 AM ? ?

## 2021-06-26 NOTE — Progress Notes (Addendum)
FPTS Interim Progress Note ? ?S: Patient seen at bedside for nighttime rounds- sleeping comfortably and I did not wake the patient. ? ?O: ?BP 125/73 (BP Location: Left Arm)   Pulse 69   Temp 98.6 ?F (37 ?C) (Oral)   Resp 19   Ht 6' (1.829 m)   Wt 125 kg   SpO2 94%   BMI 37.37 kg/m?   ?General: Sleeping comfortably ?Resp: Normal chest rise and fall ? ?A/P: ?Reviewed VS- hemodynamically stable. Has been able to wean O2, currently on 1L [place]. ?- Monitor respiratory status; goal O2 >88% ?- Continue other management per day team ? ?Labs and orders reviewed. ? ?[name], DO ?06/26/2021, 1:53 AM ?PGY-1, [place] ?Service pager [phone] ? ?

## 2021-06-26 NOTE — Progress Notes (Signed)
Orthopedic Tech Progress Note ?Patient Details:  ?Rodney [name] ?05-04-1950 ?[id number] ? ?Ortho Devices ?Type of Ortho Device: Postop shoe/boot ?Ortho Device/Splint Location: right ?Ortho Device/Splint Interventions: Ordered ?  ?  ? ?Rodney [name] [name] ?06/26/2021, 5:40 PM ?Dropped off Post op shoe. ?

## 2021-06-26 NOTE — Plan of Care (Signed)
?  Problem: Education: ?Goal: Ability to demonstrate management of disease process will improve ?Outcome: Progressing ?Goal: Ability to verbalize understanding of medication therapies will improve ?Outcome: Progressing ?Goal: Individualized Educational Video(s) ?Outcome: Progressing ?  ?Problem: Activity: ?Goal: Capacity to carry out activities will improve ?Outcome: Progressing ?  ?Problem: Cardiac: ?Goal: Ability to achieve and maintain adequate cardiopulmonary perfusion will improve ?Outcome: Progressing ?  ?Problem: Clinical Measurements: ?Goal: Ability to avoid or minimize complications of infection will improve ?Outcome: Progressing ?  ?Problem: Skin Integrity: ?Goal: Skin integrity will improve ?Outcome: Progressing ?  ?Problem: Education: ?Goal: Knowledge of disease or condition will improve ?Outcome: Progressing ?Goal: Knowledge of the prescribed therapeutic regimen will improve ?Outcome: Progressing ?  ?Problem: Activity: ?Goal: Ability to tolerate increased activity will improve ?Outcome: Progressing ?Goal: Will verbalize the importance of balancing activity with adequate rest periods ?Outcome: Progressing ?  ?Problem: Respiratory: ?Goal: Ability to maintain a clear airway will improve ?Outcome: Progressing ?Goal: Levels of oxygenation will improve ?Outcome: Progressing ?Goal: Ability to maintain adequate ventilation will improve ?Outcome: Progressing ?  ?Problem: Education: ?Goal: Knowledge of General Education information will improve ?Description: Including pain rating scale, medication(s)/side effects and non-pharmacologic comfort measures ?Outcome: Progressing ?  ?Problem: Health Behavior/Discharge Planning: ?Goal: Ability to manage health-related needs will improve ?Outcome: Progressing ?  ?Problem: Clinical Measurements: ?Goal: Ability to maintain clinical measurements within normal limits will improve ?Outcome: Progressing ?Goal: Will remain free from infection ?Outcome: Progressing ?Goal:  Diagnostic test results will improve ?Outcome: Progressing ?Goal: Respiratory complications will improve ?Outcome: Progressing ?Goal: Cardiovascular complication will be avoided ?Outcome: Progressing ?  ?Problem: Activity: ?Goal: Risk for activity intolerance will decrease ?Outcome: Progressing ?  ?Problem: Nutrition: ?Goal: Adequate nutrition will be maintained ?Outcome: Progressing ?  ?Problem: Coping: ?Goal: Level of anxiety will decrease ?Outcome: Progressing ?  ?Problem: Elimination: ?Goal: Will not experience complications related to bowel motility ?Outcome: Progressing ?Goal: Will not experience complications related to urinary retention ?Outcome: Progressing ?  ?Problem: Pain Managment: ?Goal: General experience of comfort will improve ?Outcome: Progressing ?  ?Problem: Safety: ?Goal: Ability to remain free from injury will improve ?Outcome: Progressing ?  ?Problem: Skin Integrity: ?Goal: Risk for impaired skin integrity will decrease ?Outcome: Progressing ?  ?

## 2021-06-26 NOTE — [place] FL2 (Signed)
?[place] MEDICAID FL2 LEVEL OF CARE SCREENING TOOL  ?  ? ?IDENTIFICATION  ?Patient Name: ?Rodney [name] Birthdate: 04/10/1951 Sex: male Admission Date (Current Location): ?06/19/2021  ?[place] and [place] Number: ? [place] ?  Facility and Address:  ?[facility], [place] [address], [place], [place] [address] ?     Provider Number: ?[id number]  ?Attending Physician Name and Address:  ?McDiarmid, Blane Ohara, MD ? Relative Name and Phone Number:  ?[place] (Significant other) [phone] ?   ?Current Level of Care: ?Hospital Recommended Level of Care: ?[place] Prior Approval Number: ?  ? ?Date Approved/Denied: ?  PASRR Number: ?[id number] A ? ?Discharge Plan: ?SNF ?  ? ?Current Diagnoses: ?Patient Active Problem List  ? Diagnosis Date Noted  ? Pleural effusion   ? Pressure injury of skin 06/20/2021  ? C. difficile diarrhea 06/20/2021  ? BMI 36.0-36.9,adult 06/20/2021  ? Hypoalbuminemia 06/20/2021  ? Diarrhea 06/19/2021  ? Acute on chronic heart failure with preserved ejection fraction (HFpEF) ([place])   ? Chronic respiratory failure with hypoxia ([place]) 03/26/2021  ? Acute kidney injury superimposed on chronic kidney disease ([place]) 03/26/2021  ? Peripheral neuropathy 11/11/2020  ? History of DVT (deep vein thrombosis) 11/10/2020  ? Acute on chronic anemia   ? Ascending aorta dilation ([place]) 08/04/2020  ? 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 ([place]) 08/04/2020  ? 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  ? CAD (coronary artery disease) 02/15/2020  ? CKD stage 3 due to type 2 diabetes mellitus ([place]) 02/15/2020  ? S/P BKA (below knee amputation) unilateral, left ([place]) 11/14/2019  ? High risk social situation 11/14/2019  ? AF (paroxysmal atrial fibrillation) ([place]) 10/03/2019  ? HFrEF (heart failure with reduced ejection fraction) ([place]) 09/28/2019  ? Severe aortic stenosis by prior echocardiogram 09/28/2019  ? Anemia of chronic disease 09/27/2019  ? Protein calorie malnutrition ([place])   ? PVD (peripheral  vascular disease) ([place])   ? Umbilical hernia 79/48/0165  ? Morbid obesity, unspecified obesity type ([place]) 10/09/2015  ? Tobacco abuse 09/07/2013  ? DM (diabetes mellitus), type 2 with neurological complications ([place]) 53/74/8270  ? Hypercholesteremia 10/28/2010  ? ERECTILE DYSFUNCTION 05/22/2009  ? Essential hypertension 01/23/2009  ? ? ?Orientation RESPIRATION BLADDER Height & Weight   ?  ?Self, Time, Place, Situation ? O2 Continent, External catheter Weight: 270 lb 4.5 oz (122.6 kg) ?Height:  6' (182.9 cm)  ?BEHAVIORAL SYMPTOMS/MOOD NEUROLOGICAL BOWEL NUTRITION STATUS  ?    Continent  (See dc summary)  ?AMBULATORY STATUS COMMUNICATION OF NEEDS Skin   ?Extensive Assist Verbally  (Buttock right, stage 2 partial thickness loss of dermis; presenting as a shallow open injury with a red, pink wound bed without slough) ?  ?  ?  ?    ?     ?     ? ? ?Personal Care Assistance Level of Assistance  ?Bathing, Feeding, Dressing Bathing Assistance: Maximum assistance ?Feeding assistance: Independent ?Dressing Assistance: Limited assistance ?   ? ?Functional Limitations Info  ?Hearing, Sight, Speech Sight Info: Adequate ?Hearing Info: Impaired ?Speech Info: Adequate  ? ? ?SPECIAL CARE FACTORS FREQUENCY  ?PT (By licensed PT), OT (By licensed OT)   ?  ?PT Frequency: 5x per week ?OT Frequency: 5x per week ?  ?  ?  ?   ? ? ?Contractures Contractures Info: Not present  ? ? ?Additional Factors Info  ?Code Status, Allergies, Insulin Sliding Scale Code Status Info: Full code ?Allergies Info: No Known Allergies ?  ?Insulin Sliding Scale  Info: insulin aspart (novoLOG) injection 0-9 Units ?  ?   ? ?Current Medications (06/26/2021):  This is the current hospital active medication list ?Current Facility-Administered Medications  ?Medication Dose Route Frequency Provider Last Rate Last Admin  ? albuterol (PROVENTIL) (2.5 MG/3ML) 0.083% nebulizer solution 2.5 mg  2.5 mg Inhalation Q2H PRN Eppie Gibson, MD   2.5 mg at 06/26/21 0753  ?  amiodarone (PACERONE) tablet 200 mg  200 mg Oral Daily Precious Gilding, DO   200 mg at 06/26/21 0827  ? atorvastatin (LIPITOR) tablet 80 mg  80 mg Oral Daily Precious Gilding, DO   80 mg at 06/26/21 0827  ? carvedilol (COREG) tablet 6.25 mg  6.25 mg Oral BID WC Precious Gilding, DO   6.25 mg at 06/26/21 0827  ? clopidogrel (PLAVIX) tablet 75 mg  75 mg Oral Daily Precious Gilding, DO   75 mg at 06/26/21 3976  ? fidaxomicin (DIFICID) tablet 200 mg  200 mg Oral BID Eppie Gibson, MD   200 mg at 06/26/21 7341  ? fluticasone (FLONASE) 50 MCG/ACT nasal spray 2 spray  2 spray Each Nare Daily Precious Gilding, DO      ? fluticasone furoate-vilanterol (BREO ELLIPTA) 100-25 MCG/ACT 1 puff  1 puff Inhalation Daily Wells Guiles, DO   1 puff at 06/26/21 0744  ? And  ? umeclidinium bromide (INCRUSE ELLIPTA) 62.5 MCG/ACT 1 puff  1 puff Inhalation Daily Wells Guiles, DO   1 puff at 06/26/21 0744  ? furosemide (LASIX) tablet 40 mg  40 mg Oral Daily Precious Gilding, DO   40 mg at 06/26/21 9379  ? gabapentin (NEURONTIN) capsule 600 mg  600 mg Oral QHS Precious Gilding, DO   600 mg at 06/25/21 2113  ? Gerhardt's butt cream   Topical BID Lind Covert, MD   Given at 06/26/21 0827  ? insulin aspart (novoLOG) injection 0-9 Units  0-9 Units Subcutaneous TID WC Precious Gilding, DO   2 Units at 06/26/21 1258  ? insulin detemir (LEVEMIR) injection 13 Units  13 Units Subcutaneous Daily Zola Button, MD   13 Units at 06/26/21 0825  ? ipratropium-albuterol (DUONEB) 0.5-2.5 (3) MG/3ML nebulizer solution 3 mL  3 mL Nebulization TID PRN Precious Gilding, DO      ? nicotine (NICODERM CQ - dosed in mg/24 hr) patch 7 mg  7 mg Transdermal Daily Precious Gilding, DO   7 mg at 06/26/21 0827  ? pantoprazole (PROTONIX) EC tablet 40 mg  40 mg Oral BID Precious Gilding, DO   40 mg at 06/26/21 0240  ? rivaroxaban (XARELTO) tablet 10 mg  10 mg Oral Daily Precious Gilding, DO   10 mg at 06/26/21 9735  ? sodium chloride (OCEAN) 0.65 % nasal spray 1 spray  1 spray Each Nare PRN McDiarmid,  Blane Ohara, MD      ? traZODone (DESYREL) tablet 25-50 mg  25-50 mg Oral QHS PRN Precious Gilding, DO   50 mg at 06/24/21 2246  ? ? ? ?Discharge Medications: ?Please see discharge summary for a list of discharge medications. ? ?Relevant Imaging Results: ? ?Relevant Lab Results: ? ? ?Additional Information ?SSN [id number]. PFIZER Comrnaty(Gray TOP) Covid-19 Vaccine 07/03/2020  Pfizer COVID-19 Vaccine 12/15/2019 , 11/23/2019  Pfizer Covid-19 Vaccine Bivalent Booster 02/13/2021 ? ?Lissa Morales Emerly Prak, LCSW ? ? ? ? ?

## 2021-06-26 NOTE — TOC Initial Note (Signed)
Transition of Care (TOC) - Initial/Assessment Note  ? ? ?Patient Details  ?Name: Rodney [name] ?MRN: [id number] ?Date of Birth: October 17, 1950 ? ?Transition of Care (TOC) CM/SW Contact:    ?Benard Halsted, LCSW ?Phone Number: ?06/26/2021, 4:37 PM ? ?Clinical Narrative:                 ?Patient now requesting SNF placement. CSW sending out referral for review and will provide bed offers to patient.  ? ?Expected Discharge Plan: [place] ?Barriers to Discharge: Ship broker, SNF Pending bed offer ? ? ?Patient Goals and CMS Choice ?Patient states their goals for this hospitalization and ongoing recovery are:: Rehab ?CMS Medicare.gov Compare Post Acute Care list provided to:: Patient ?Choice offered to / list presented to : Patient ? ?Expected Discharge Plan and Services ?Expected Discharge Plan: [place] ?In-house Referral: Clinical Social Work ?  ?Post Acute Care Choice: [place] ?Living arrangements for the past 2 months: [place] ?                ?  ?  ?  ?  ?  ?  ?  ?  ?  ?  ? ?Prior Living Arrangements/Services ?Living arrangements for the past 2 months: [place] ?Lives with:: Significant Other ?Patient language and need for interpreter reviewed:: Yes ?Do you feel safe going back to the place where you live?: Yes      ?Need for Family Participation in Patient Care: No (Comment) ?Care giver support system in place?: Yes (comment) ?Current home services: DME ?Criminal Activity/Legal Involvement Pertinent to Current Situation/Hospitalization: No - Comment as needed ? ?Activities of Daily Living ?  ?  ? ?Permission Sought/Granted ?Permission sought to share information with : Facility Sport and exercise psychologist, Family Supports ?Permission granted to share information with : Yes, Verbal Permission Granted ? Share Information with NAME: [name] ? Permission granted to share info w AGENCY: SNFs ? Permission granted to share info w Relationship:  Significant other ? Permission granted to share info w Contact Information: [phone] ? ?Emotional Assessment ?Appearance:: Appears stated age ?Attitude/Demeanor/Rapport: Engaged ?Affect (typically observed): Accepting, Appropriate ?Orientation: : Oriented to Self, Oriented to Place, Oriented to  Time, Oriented to Situation ?Alcohol / Substance Use: Not Applicable ?Psych Involvement: No (comment) ? ?Admission diagnosis:  Diarrhea [R19.7] ?Acute kidney injury superimposed on chronic kidney disease ([place]) [N17.9, N18.9] ?Patient Active Problem List  ? Diagnosis Date Noted  ? Pleural effusion   ? Pressure injury of skin 06/20/2021  ? C. difficile diarrhea 06/20/2021  ? BMI 36.0-36.9,adult 06/20/2021  ? Hypoalbuminemia 06/20/2021  ? Diarrhea 06/19/2021  ? Acute on chronic heart failure with preserved ejection fraction (HFpEF) ([place])   ? Chronic respiratory failure with hypoxia ([place]) 03/26/2021  ? Acute kidney injury superimposed on chronic kidney disease ([place]) 03/26/2021  ? Peripheral neuropathy 11/11/2020  ? History of DVT (deep vein thrombosis) 11/10/2020  ? Acute on chronic anemia   ? Ascending aorta dilation ([place]) 08/04/2020  ? 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 ([place]) 08/04/2020  ? 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  ? CAD (coronary artery disease) 02/15/2020  ? CKD stage 3 due to type 2 diabetes mellitus ([place]) 02/15/2020  ? S/P BKA (below knee amputation) unilateral, left ([place]) 11/14/2019  ? High risk social situation 11/14/2019  ? AF (paroxysmal atrial fibrillation) ([place]) 10/03/2019  ? HFrEF (heart failure with reduced ejection fraction) ([place]) 09/28/2019  ? Severe aortic stenosis by prior echocardiogram 09/28/2019  ? Anemia of chronic  disease 09/27/2019  ? Protein calorie malnutrition ([place])   ? PVD (peripheral vascular disease) ([place])   ? Umbilical hernia 40/98/1191  ? Morbid obesity, unspecified obesity type ([place]) 10/09/2015  ? Tobacco abuse 09/07/2013  ? DM (diabetes mellitus), type 2 with neurological complications ([place])  47/82/9562  ? Hypercholesteremia 10/28/2010  ? ERECTILE DYSFUNCTION 05/22/2009  ? Essential hypertension 01/23/2009  ? ?PCP:  [name], MD ?Pharmacy:   ?[place], [place] - [address] ?[address] ?[place] [address] ?Phone: [phone] Fax: [phone] ? ?[facility] ?[address] ?[place] [address] ?Phone: [phone] Fax: [phone] ? ? ? ? ?Social Determinants of Health (SDOH) Interventions ?  ? ?Readmission Risk Interventions ?Readmission Risk Prevention Plan 06/26/2021 05/16/2021 03/26/2021  ?Transportation Screening Complete Complete Complete  ?PCP or Specialist Appt within 3-5 Days - - -  ?[place] or [place] - - -  ?Social Work Consult for [place] Planning/Counseling - - -  ?Palliative Care Screening - - -  ?Medication Review Press photographer) Complete Complete Complete  ?PCP or Specialist appointment within 3-5 days of discharge Complete Complete -  ?[place] or Home Care Consult Complete Complete -  ?SW Recovery Care/Counseling Consult Complete Complete Complete  ?Palliative Care Screening Not Applicable Not Applicable Not Applicable  ?Skilled Nursing Facility Complete Not Applicable Not Applicable  ?Some recent data might be hidden  ? ? ? ?

## 2021-06-26 NOTE — Progress Notes (Signed)
Family Medicine Teaching Service ?Daily Progress Note ?Intern Pager: [phone] ? ?Patient name: Rodney [name] Medical record number: [id number] ?Date of birth: 1950/07/22 Age: 71 y.o. Gender: male ? ?Primary Care Provider: [name], MD ?Consultants: Vascular surgery, PCCM ?Code Status: Full ? ?Pt Overview and Major Events to Date:  ?2/23-admitted for diarrhea found to be C diff colitis ?2/24 ? ? ? ?Assessment and Plan: ?Rodney [name] is a 71 y.o. male who presented with multiple weeks of nonbloody, watery diarrhea found to be C. difficile positive.PMHx significant for T2DM, obesity, tobacco use, HTN, protein calorie malnutrition, aortic stenosis, anemia of chronic disease, PVD, HFrEF, PAF, CAD, CKD 3, COPD, status post BKA. ? ?Acute on chronic hypoxic respiratory failure: Resolved  persistent left pleural effusion  COPD: stable ?Satting in lower 90s on 1.5L [place] which is improved from his baseline. UOP 1L. He is medically stable at this time but does not have safe plan for mobility at this time. I feel that he would benefit from continued PT at SNF.  ?-PT/OT ?-Strict I's and O's ?-Daily weights ?-Continue DuoNebs every 4 hours as needed ?-Continue home Lasix oral 40 mg daily ?-Continue Breo Incruse Ellipta (equivalent to home Trelegy) ? ?C. Difficile colitis: Improved ?Remains afebrile and hemodynamically stable overnight and without leukocytosis at this time. Day 7/10 of antibiotic therapy. ?-continue oral fidaxomicin (day 7/10) ? ?Anemia of chronic disease with history of requiring transfusion: Stable ?Threshold <8.0 due to CAD/PAD. Hgb went from 8.8>8.4. Received 2u total during this hospitalization.  ?-Resume home iron supplementation on discharge ?-monitor CBC daily ? ?AKI on CKD 3a: resolved ?Creatinine 1.34 today. Similar to baseline. ?-Continue to monitor BMP ? ?Chronic conditions stable and managed with home medications as required (T2DM, PAF, GERD, Insomnia, Tobacco use). ? ?FEN/GI: Carb  modified ?PPx: Xarelto 10 mg daily ?Dispo: Home. Pending safe mobility plan with PT/OT. ? ?Subjective:  ?Patient is hopeful he can go home today.  States he does not have safety concerns regarding his transfer from bed to chair as his girlfriend uses a gait belt and they have been doing well with that at home for years.  He endorses regular wheezing at home.  States his diarrhea has resolved. ? ?Objective: ?Temp:  [97.7 ?F (36.5 ?C)-98.7 ?F (37.1 ?C)] 98.5 ?F (36.9 ?C) (03/02 0350) ?Pulse Rate:  [68-83] 81 (03/02 0737) ?Resp:  [17-24] 24 (03/02 0737) ?BP: (104-135)/(50-92) 131/68 (03/02 0737) ?SpO2:  [90 %-98 %] 90 % (03/02 0753) ?Weight:  [122.6 kg] 122.6 kg (03/02 0500) ?Physical Exam: ?General: Laying in bed comfortably, awake and alert, appropriate in conversation ?Cardiovascular: RRR, systolic murmur appreciated ?Respiratory: Good aeration, wheezing bilaterally ?Abdomen: Soft, nontender, normoactive bowel sounds ?Extremities: Trace edema to the knee of RLE, left BKA ? ?Laboratory: ?Recent Labs  ?Lab 06/24/21 ?0938 06/24/21 ?0630 06/24/21 ?1629 06/25/21 ?1829 06/26/21 ?0427  ?WBC 6.8  --   --  6.9 6.2  ?HGB 7.5*   < > 7.9* 8.8* 8.4*  ?HCT 24.7*   < > 25.2* 27.7* 26.9*  ?PLT 284  --   --  282 300  ? < > = values in this interval not displayed.  ? ?Recent Labs  ?Lab 06/19/21 ?1717 06/20/21 ?0457 06/24/21 ?9371 06/25/21 ?6967 06/26/21 ?0427  ?NA 133*   < > 135 137 134*  ?K 4.4   < > 4.7 4.6 4.7  ?CL 98   < > 99 99 97*  ?CO2 28   < > 30 32 29  ?BUN 27*   < >  26* 28* 30*  ?CREATININE 1.88*   < > 1.41* 1.27* 1.34*  ?CALCIUM 7.6*   < > 7.9* 8.2* 8.2*  ?PROT 5.1*  --   --   --   --   ?BILITOT 0.5  --   --   --   --   ?ALKPHOS 82  --   --   --   --   ?ALT 25  --   --   --   --   ?AST 18  --   --   --   --   ?GLUCOSE 106*   < > 157* 137* 143*  ? < > = values in this interval not displayed.  ? ? ?CBG (last 3)  ?Recent Labs  ?  06/25/21 ?1228 06/25/21 ?1646 06/26/21 ?0739  ?GLUCAP 133* 111* 140*  ?  ? ?Imaging/Diagnostic  Tests: ?No results found.  ? ?Wells Guiles, DO ?06/26/2021, 7:56 AM ?PGY-1, [place] ?[place] Intern pager: [phone], text pages welcome ? ?

## 2021-06-26 NOTE — Evaluation (Signed)
Occupational Therapy Evaluation Patient Details Name: Rodney [name] MRN: [id number] DOB: 07-17-1950 Today's Date: 06/26/2021   History of Present Illness 71 y.o. male presenting 06/19/21 with multiple weeks of nonbloody nonwatery diarrhea. + c-diff; Rt second toe with gangrene (pt refusing amputation); RUE with large hematoma s/p blood transfusion   PMH is significant for type 2 diabetes, obesity, tobacco abuse, hypertension, protein calorie malnutrition, aortic stenosis, anemia of chronic disease, PVD, HFrEF, PAF, s/p L BKA, CAD, CKD stage III, COPD, R 1st toe amputation   Clinical Impression   Pt in bed at start of session agreeable to participate in some therapy but still limiting secondary to right arm pain.  He was able to transfer to the EOB with min assist from therapist and sit for 5-10 mins at modified independent level for 5-8 mins.  Oxygen sats were at 94-95% on 1.5Ls at rest with slight decline 87%-88% in sitting.  Pt did not have a shoe for the RLE or his prosthesis.  He declined transfer out of the bed because of this as well as not feeling like he could use the RUE to assist.  He did attempt two small scoots up toward the top of the bed with overall min assist before transferring back to supine at supervision level.  Feel he will benefit from acute care OT to address the current deficits and help increase overall strength and ROM in the RUE as well as increasing overall ADL independence.  Will benefit from follow-up SNF rehab prior to discharge home with pt voicing understanding that this is a good option.  He reported being at [facility] in the past but requested not to go to [facility].       Recommendations for follow up therapy are one component of a multi-disciplinary discharge planning process, led by the attending physician.  Recommendations may be updated based on patient status, additional functional criteria and insurance authorization.   Follow Up  Recommendations  Skilled nursing-short term rehab (<3 hours/day)    Assistance Recommended at Discharge Other (comment) (min assist)  Patient can return home with the following A little help with bathing/dressing/bathroom;A little help with walking and/or transfers    Functional Status Assessment  Patient has had a recent decline in their functional status and demonstrates the ability to make significant improvements in function in a reasonable and predictable amount of time.  Equipment Recommendations  None recommended by OT    Recommendations for Other Services       Precautions / Restrictions Precautions Precautions: Fall Required Braces or Orthoses: Other Brace Other Brace: left prosthesis Restrictions Weight Bearing Restrictions: No      Mobility Bed Mobility Overal bed mobility: Needs Assistance Bed Mobility: Rolling, Sidelying to Sit, Sit to Supine Rolling: Modified independent (Device/Increase time) Sidelying to sit: Min assist   Sit to supine: Supervision   General bed mobility comments: pt used RUE to pull his torso up to sitting (pulling against PTs hand), while pushing up with his LUE    Transfers Overall transfer level: Needs assistance (declined OOB transfer today) Equipment used: None              Lateral/Scoot Transfers: Min assist General transfer comment: pt scooted x 2 along EOB towards his left using UEs only (no protective shoe present to go over his Rt foot bandage)--messaged MD to order a post-op shoe; due to right UE pain and generalized weakness anticipate he will require up to mod assist  or more to transfer  to chair      Balance Overall balance assessment: Needs assistance Sitting-balance support: Bilateral upper extremity supported, Feet unsupported Sitting balance-Leahy Scale: Good                                     ADL either performed or assessed with clinical judgement   ADL Overall ADL's : Needs  assistance/impaired Eating/Feeding: Set up;Bed level   Grooming: Set up;Bed level   Upper Body Bathing: Minimal assistance;Sitting Upper Body Bathing Details (indicate cue type and reason): simulated Lower Body Bathing: Moderate assistance;Bed level Lower Body Bathing Details (indicate cue type and reason): simulated Upper Body Dressing : Minimal assistance;Sitting Upper Body Dressing Details (indicate cue type and reason): simulated Lower Body Dressing: Maximal assistance;Bed level Lower Body Dressing Details (indicate cue type and reason): supine to sit simulated               General ADL Comments: Limited OT eval secondary to pt self limiting because of the RUE pain. He did complete supine to sit EOB, but declined getting OOB or scooting more than one time secondary to pain in his right arm.  Will need some type of darko shoe or cast shoe on the right foot as it was bandaged up and the gripper sock would not fit on it.  Pt reports being weak overall and is open to going to rehab at SNF level.     Vision Baseline Vision/History: 0 No visual deficits Ability to See in Adequate Light: 0 Adequate Patient Visual Report: No change from baseline Vision Assessment?: No apparent visual deficits     Perception Perception Perception: Within Functional Limits   Praxis Praxis Praxis: Intact    Pertinent Vitals/Pain Pain Assessment Pain Assessment: Faces Faces Pain Scale: Hurts even more Pain Location: Rt UE Pain Descriptors / Indicators: Discomfort, Guarding Pain Intervention(s): Limited activity within patient's tolerance, Repositioned     Hand Dominance Right   Extremity/Trunk Assessment Upper Extremity Assessment Upper Extremity Assessment: Generalized weakness;RUE deficits/detail;LUE deficits/detail RUE Deficits / Details: Pt with increased bruising in the right elbow biceps area with increased pain.  Shoulder or elbow strength not formally assessed secondary to pain and  slight pt agitation.  Grip strength 3+/5 RUE: Unable to fully assess due to pain RUE Sensation: WNL RUE Coordination: WNL LUE Deficits / Details: Grip strength 4/5 with pt reporting no deficits with AROM in the shoulder or elbow.  Limited assessment secondadry to slight increased agitation. LUE Sensation: WNL LUE Coordination: WNL   Lower Extremity Assessment Lower Extremity Assessment: Defer to PT evaluation   Cervical / Trunk Assessment Cervical / Trunk Assessment: Normal   Communication Communication Communication: No difficulties   Cognition Arousal/Alertness: Awake/alert Behavior During Therapy: WFL for tasks assessed/performed Overall Cognitive Status: Within Functional Limits for tasks assessed                                 General Comments: Cognition not formally assessed but pt able to engage in appropriate conversation.  Not able to aske specific questions secondary to pt getting slightly agitated.                Home Living Family/patient expects to be discharged to:: Private residence Living Arrangements: Spouse/significant other (girlfriend of 27 years) Available Help at Discharge: Friend(s);Available 24 hours/day Type of Home: Mobile home Home Access: Ramped  entrance     Home Layout: One level     Bathroom Shower/Tub: Teacher, early years/pre: Standard Bathroom Accessibility: Yes   Home Equipment: Wheelchair - manual;Tub bench;BSC/3in1;Cane - single point;Rolling Environmental consultant (2 wheels)   Additional Comments: most information from prior chart as pt not wanting to answer a bunch of questions      Prior Functioning/Environment Prior Level of Function : Needs assist       Physical Assist : Mobility (physical) Mobility (physical): Transfers   Mobility Comments: says typically can transfer to w/c without prosthesis independently, however sometimes girlfriend has to assist with gait belt          OT Problem List: Decreased  strength;Decreased range of motion;Decreased knowledge of use of DME or AE;Decreased coordination;Decreased activity tolerance;Cardiopulmonary status limiting activity;Impaired balance (sitting and/or standing);Pain;Impaired UE functional use      OT Treatment/Interventions: Self-care/ADL training;Therapeutic exercise;Patient/family education;Balance training;Therapeutic activities;DME and/or AE instruction    OT Goals(Current goals can be found in the care plan section) Acute Rehab OT Goals Patient Stated Goal: To get stronger OT Goal Formulation: With patient Time For Goal Achievement: 07/10/21 Potential to Achieve Goals: Fair  OT Frequency: Min 2X/week    Co-evaluation PT/OT/SLP Co-Evaluation/Treatment: Yes Reason for Co-Treatment: For patient/therapist safety PT goals addressed during session: Mobility/safety with mobility;Balance OT goals addressed during session: Strengthening/ROM;ADL's and self-care      AM-PAC OT "6 Clicks" Daily Activity     Outcome Measure Help from another person eating meals?: None Help from another person taking care of personal grooming?: None Help from another person toileting, which includes using toliet, bedpan, or urinal?: A Lot Help from another person bathing (including washing, rinsing, drying)?: A Lot Help from another person to put on and taking off regular upper body clothing?: A Little Help from another person to put on and taking off regular lower body clothing?: A Lot 6 Click Score: 17   End of Session Equipment Utilized During Treatment: Oxygen Nurse Communication: Mobility status  Activity Tolerance: Patient limited by pain;Treatment limited secondary to agitation Patient left: in bed;with call bell/phone within reach;with bed alarm set  OT Visit Diagnosis: Muscle weakness (generalized) (M62.81);Pain;Other abnormalities of gait and mobility (R26.89) Pain - Right/Left: Right Pain - part of body: Arm                Time: 5809-9833 OT  Time Calculation (min): 26 min Charges:  OT Evaluation $OT Eval Moderate Complexity: 1 Mod [place] OTR/L 06/26/2021, 12:31 PM

## 2021-06-27 DIAGNOSIS — A0472 Enterocolitis due to Clostridium difficile, not specified as recurrent: Secondary | ICD-10-CM | POA: Diagnosis not present

## 2021-06-27 DIAGNOSIS — I480 Paroxysmal atrial fibrillation: Secondary | ICD-10-CM | POA: Diagnosis not present

## 2021-06-27 DIAGNOSIS — D638 Anemia in other chronic diseases classified elsewhere: Secondary | ICD-10-CM | POA: Diagnosis not present

## 2021-06-27 DIAGNOSIS — N179 Acute kidney failure, unspecified: Secondary | ICD-10-CM | POA: Diagnosis not present

## 2021-06-27 LAB — BASIC METABOLIC PANEL
Anion gap: 7 (ref 5–15)
BUN: 30 mg/dL — ABNORMAL HIGH (ref 8–23)
CO2: 31 mmol/L (ref 22–32)
Calcium: 8.2 mg/dL — ABNORMAL LOW (ref 8.9–10.3)
Chloride: 95 mmol/L — ABNORMAL LOW (ref 98–111)
Creatinine, Ser: 1.55 mg/dL — ABNORMAL HIGH (ref 0.61–1.24)
GFR, Estimated: 48 mL/min — ABNORMAL LOW (ref >60)
Glucose, Bld: 175 mg/dL — ABNORMAL HIGH (ref 70–99)
Potassium: 4.4 mmol/L (ref 3.5–5.1)
Sodium: 133 mmol/L — ABNORMAL LOW (ref 135–145)

## 2021-06-27 LAB — GLUCOSE, CAPILLARY
Glucose-Capillary: 153 mg/dL — ABNORMAL HIGH (ref 70–99)
Glucose-Capillary: 182 mg/dL — ABNORMAL HIGH (ref 70–99)
Glucose-Capillary: 146 mg/dL — ABNORMAL HIGH (ref 70–99)
Glucose-Capillary: 131 mg/dL — ABNORMAL HIGH (ref 70–99)

## 2021-06-27 LAB — CBC
HCT: 26.1 % — ABNORMAL LOW (ref 39.0–52.0)
Hemoglobin: 7.9 g/dL — ABNORMAL LOW (ref 13.0–17.0)
MCH: 26.8 pg (ref 26.0–34.0)
MCHC: 30.3 g/dL (ref 30.0–36.0)
MCV: 88.5 fL (ref 80.0–100.0)
Platelets: 301 10*3/uL (ref 150–400)
RBC: 2.95 MIL/uL — ABNORMAL LOW (ref 4.22–5.81)
RDW: 18.4 % — ABNORMAL HIGH (ref 11.5–15.5)
WBC: 5.3 10*3/uL (ref 4.0–10.5)
nRBC: 0 % (ref 0.0–0.2)

## 2021-06-27 LAB — HEMOGLOBIN AND HEMATOCRIT, BLOOD
HCT: 27 % — ABNORMAL LOW (ref 39.0–52.0)
Hemoglobin: 8.3 g/dL — ABNORMAL LOW (ref 13.0–17.0)

## 2021-06-27 MED ORDER — ACETAMINOPHEN 500 MG PO TABS
1000.0000 mg | ORAL_TABLET | Freq: Four times a day (QID) | ORAL | Status: DC | PRN
  Administered 2021-06-27 – 2021-06-29 (×3): 1000 mg via ORAL
  Filled 2021-06-27 (×3): qty 2

## 2021-06-27 NOTE — Progress Notes (Signed)
Family Medicine Teaching Service ?Daily Progress Note ?Intern Pager: [phone] ? ?Patient name: Rodney [name] Medical record number: [id number] ?Date of birth: 06-02-1950 Age: 71 y.o. Gender: male ? ?Primary Care Provider: [name], MD ?Consultants: Vascular surgery (S/O), PCCM (S/O) ?Code Status: Full ? ?Pt Overview and Major Events to Date:  ?2/23-admitted for diarrhea found to be C diff colitis ? ?Assessment and Plan: ?Rodney [name] is a 71 y.o. male who presented with nonbloody, watery diarrhea found to be C. Diff positive which has improved well on antibiotics. PMHx significant for T2DM, obesity, tobacco use, HTN, protein calorie malnutrition, aortic stenosis, anemia of chronic disease, PVD, HFrEF, PAF, CAD, CKD 3, COPD, s/p L BKA. ? ?Acute on chronic hypoxic respiratory failure: Resolved  persistent left pleural effusion: Stable  COPD stable ?Satting in the low 90s on 2.5 L nasal cannula overnight.. He continues to be medically stable and awaiting placement for SNF. ?-PT/OT ?-Strict I's and O's ?-Daily weights ?-Continue DuoNebs every 4 hours as needed ?-Continue home Lasix 40 mg daily ?-Continue Breo Incruse Ellipta (equivalent to home Trelegy) ? ?C. difficile colitis: Improved ?Patient has remained afebrile hemodynamically stable and continues to have normal WBC.  Day 8/10 of antibiotic therapy ?-Continue oral fidaxomicin (day 8/10) ? ?Anemia of chronic disease with history of requiring transfusion: Stable ?Threshold <8.0 due to CAD/PAD.  Hemoglobin originally down at 7.9 with recheck being 8.3.  Received 2 units total during this hospitalization. ?-Resume home iron supplementation on discharge ? ?AKI on CKD 3a ?Baseline 1.2-1.3.  Today increased to 1.55 likely in the setting of difficulty balancing lasix use.  ?-daily BMP ? ?Chronic conditions stable and managed with home medications as required (T2DM, PAF, GERD, insomnia, tobacco use). ? ?FEN/GI: Carb modified ?PPx: Xarelto 10 mg  daily ?Dispo:SNF today. Barriers include placement.  ? ?Subjective:  ?Patient is still on minimal but disappointed to SNF.  Stating his breathing is still difficult but improves with medication and describes this as "same old same old". ? ?Objective: ?Temp:  [98 ?F (36.7 ?C)-98.7 ?F (37.1 ?C)] 98.6 ?F (37 ?C) (03/03 0740) ?Pulse Rate:  [70-72] 72 (03/03 0402) ?Resp:  [16-25] 16 (03/03 0740) ?BP: (105-144)/(42-76) 144/66 (03/03 0740) ?SpO2:  [88 %-97 %] 88 % (03/03 0740) ?Physical Exam: ?General: Awake and alert, on bedpan, [place] in conversation ?Cardiovascular: RRR, systolic murmur appreciated ?Respiratory: CTA B, no wheezing appreciated, [place] in place ?Extremities: Left BKA ? ?Laboratory: ?Recent Labs  ?Lab 06/25/21 ?0711 06/26/21 ?0427 06/27/21 ?7902 06/27/21 ?4097  ?WBC 6.9 6.2 5.3  --   ?HGB 8.8* 8.4* 7.9* 8.3*  ?HCT 27.7* 26.9* 26.1* 27.0*  ?PLT 282 300 301  --   ? ?Recent Labs  ?Lab 06/25/21 ?0711 06/26/21 ?0427 06/27/21 ?3532  ?NA 137 134* 133*  ?K 4.6 4.7 4.4  ?CL 99 97* 95*  ?CO2 32 29 31  ?BUN 28* 30* 30*  ?CREATININE 1.27* 1.34* 1.55*  ?CALCIUM 8.2* 8.2* 8.2*  ?GLUCOSE 137* 143* 175*  ? ? ?CBG (last 3)  ?Recent Labs  ?  06/26/21 ?1601 06/26/21 ?2015 06/27/21 ?0745  ?GLUCAP 168* 154* 153*  ?  ? ?Imaging/Diagnostic Tests: ?No results found.  ? ?Wells Guiles, DO ?06/27/2021, 7:49 AM ?PGY-1, [place] [facility] ?[place] Intern pager: [phone], text pages welcome ? ?

## 2021-06-27 NOTE — Plan of Care (Signed)
?  Problem: Education: ?Goal: Ability to demonstrate management of disease process will improve ?Outcome: Progressing ?Goal: Ability to verbalize understanding of medication therapies will improve ?Outcome: Progressing ?Goal: Individualized Educational Video(s) ?Outcome: Progressing ?  ?Problem: Activity: ?Goal: Capacity to carry out activities will improve ?Outcome: Progressing ?  ?Problem: Cardiac: ?Goal: Ability to achieve and maintain adequate cardiopulmonary perfusion will improve ?Outcome: Progressing ?  ?Problem: Clinical Measurements: ?Goal: Ability to avoid or minimize complications of infection will improve ?Outcome: Progressing ?  ?Problem: Skin Integrity: ?Goal: Skin integrity will improve ?Outcome: Progressing ?  ?Problem: Education: ?Goal: Knowledge of disease or condition will improve ?Outcome: Progressing ?Goal: Knowledge of the prescribed therapeutic regimen will improve ?Outcome: Progressing ?  ?Problem: Activity: ?Goal: Ability to tolerate increased activity will improve ?Outcome: Progressing ?Goal: Will verbalize the importance of balancing activity with adequate rest periods ?Outcome: Progressing ?  ?Problem: Respiratory: ?Goal: Ability to maintain a clear airway will improve ?Outcome: Progressing ?Goal: Levels of oxygenation will improve ?Outcome: Progressing ?Goal: Ability to maintain adequate ventilation will improve ?Outcome: Progressing ?  ?Problem: Education: ?Goal: Knowledge of General Education information will improve ?Description: Including pain rating scale, medication(s)/side effects and non-pharmacologic comfort measures ?Outcome: Progressing ?  ?Problem: Health Behavior/Discharge Planning: ?Goal: Ability to manage health-related needs will improve ?Outcome: Progressing ?  ?Problem: Clinical Measurements: ?Goal: Ability to maintain clinical measurements within normal limits will improve ?Outcome: Progressing ?Goal: Will remain free from infection ?Outcome: Progressing ?Goal:  Diagnostic test results will improve ?Outcome: Progressing ?Goal: Respiratory complications will improve ?Outcome: Progressing ?Goal: Cardiovascular complication will be avoided ?Outcome: Progressing ?  ?Problem: Activity: ?Goal: Risk for activity intolerance will decrease ?Outcome: Progressing ?  ?Problem: Nutrition: ?Goal: Adequate nutrition will be maintained ?Outcome: Progressing ?  ?Problem: Coping: ?Goal: Level of anxiety will decrease ?Outcome: Progressing ?  ?Problem: Elimination: ?Goal: Will not experience complications related to bowel motility ?Outcome: Progressing ?Goal: Will not experience complications related to urinary retention ?Outcome: Progressing ?  ?Problem: Pain Managment: ?Goal: General experience of comfort will improve ?Outcome: Progressing ?  ?Problem: Safety: ?Goal: Ability to remain free from injury will improve ?Outcome: Progressing ?  ?Problem: Skin Integrity: ?Goal: Risk for impaired skin integrity will decrease ?Outcome: Progressing ?  ?

## 2021-06-27 NOTE — TOC Progression Note (Addendum)
Transition of Care (TOC) - Progression Note  ? ? ?Patient Details  ?Name: Rodney [name] ?MRN: [id number] ?Date of Birth: 15-Jun-1950 ? ?Transition of Care (TOC) CM/SW Contact  ?Benard Halsted, LCSW ?Phone Number: ?06/27/2021, 9:54 AM ? ?Clinical Narrative:    ?9:54am-CSW submitted clinicals for review to [facility] pending a facility choice. CSW will present bed offers to patient; currently no [place] offers (no beds at [facility] until Monday). Requested [facility] review referral. Requested COVID test.  ? ?CSW presented bed offers to patient; he is requesting a [facility] and his offers are [facility] or [facility]. He prefers [facility] as he has been there before and it is close for his significant other. CSW spoke with both facilities and they do not have a private room (for C Dif) until Monday. CSW spoke with Claiborne Billings at [facility] and she will arrange a private room for patient on Monday. CSW updated patient's insurance company, which has approved SNF. Please complete COVID test Sunday if possible.  ? ? ?Expected Discharge Plan: [place] ?Barriers to Discharge: Ship broker, SNF Pending bed offer ? ?Expected Discharge Plan and Services ?Expected Discharge Plan: [place] ?In-house Referral: Clinical Social Work ?  ?Post Acute Care Choice: [place] ?Living arrangements for the past 2 months: [place] ?                ?  ?  ?  ?  ?  ?  ?  ?  ?  ?  ? ? ?Social Determinants of Health (SDOH) Interventions ?  ? ?Readmission Risk Interventions ?Readmission Risk Prevention Plan 06/26/2021 05/16/2021 03/26/2021  ?Transportation Screening Complete Complete Complete  ?PCP or Specialist Appt within 3-5 Days - - -  ?[place] or [place] - - -  ?Social Work Consult for [place] Planning/Counseling - - -  ?Palliative Care Screening - - -  ?Medication Review Press photographer) Complete Complete  Complete  ?PCP or Specialist appointment within 3-5 days of discharge Complete Complete -  ?[facility] or Home Care Consult Complete Complete -  ?SW Recovery Care/Counseling Consult Complete Complete Complete  ?Palliative Care Screening Not Applicable Not Applicable Not Applicable  ?Skilled Nursing Facility Complete Not Applicable Not Applicable  ?Some recent data might be hidden  ? ? ?

## 2021-06-28 DIAGNOSIS — N179 Acute kidney failure, unspecified: Secondary | ICD-10-CM | POA: Diagnosis not present

## 2021-06-28 DIAGNOSIS — D638 Anemia in other chronic diseases classified elsewhere: Secondary | ICD-10-CM | POA: Diagnosis not present

## 2021-06-28 DIAGNOSIS — A0472 Enterocolitis due to Clostridium difficile, not specified as recurrent: Secondary | ICD-10-CM | POA: Diagnosis not present

## 2021-06-28 DIAGNOSIS — I480 Paroxysmal atrial fibrillation: Secondary | ICD-10-CM | POA: Diagnosis not present

## 2021-06-28 LAB — BASIC METABOLIC PANEL
Anion gap: 5 (ref 5–15)
BUN: 29 mg/dL — ABNORMAL HIGH (ref 8–23)
CO2: 34 mmol/L — ABNORMAL HIGH (ref 22–32)
Calcium: 8.3 mg/dL — ABNORMAL LOW (ref 8.9–10.3)
Chloride: 96 mmol/L — ABNORMAL LOW (ref 98–111)
Creatinine, Ser: 1.25 mg/dL — ABNORMAL HIGH (ref 0.61–1.24)
GFR, Estimated: >60 mL/min (ref >60)
Glucose, Bld: 94 mg/dL (ref 70–99)
Potassium: 4 mmol/L (ref 3.5–5.1)
Sodium: 135 mmol/L (ref 135–145)

## 2021-06-28 LAB — CBC
HCT: 25.5 % — ABNORMAL LOW (ref 39.0–52.0)
Hemoglobin: 7.9 g/dL — ABNORMAL LOW (ref 13.0–17.0)
MCH: 27.4 pg (ref 26.0–34.0)
MCHC: 31 g/dL (ref 30.0–36.0)
MCV: 88.5 fL (ref 80.0–100.0)
Platelets: 291 10*3/uL (ref 150–400)
RBC: 2.88 MIL/uL — ABNORMAL LOW (ref 4.22–5.81)
RDW: 17.9 % — ABNORMAL HIGH (ref 11.5–15.5)
WBC: 5.5 10*3/uL (ref 4.0–10.5)
nRBC: 0 % (ref 0.0–0.2)

## 2021-06-28 LAB — GLUCOSE, CAPILLARY
Glucose-Capillary: 92 mg/dL (ref 70–99)
Glucose-Capillary: 148 mg/dL — ABNORMAL HIGH (ref 70–99)
Glucose-Capillary: 211 mg/dL — ABNORMAL HIGH (ref 70–99)
Glucose-Capillary: 224 mg/dL — ABNORMAL HIGH (ref 70–99)

## 2021-06-28 LAB — HEMOGLOBIN AND HEMATOCRIT, BLOOD
HCT: 26.8 % — ABNORMAL LOW (ref 39.0–52.0)
Hemoglobin: 8.3 g/dL — ABNORMAL LOW (ref 13.0–17.0)

## 2021-06-28 NOTE — Progress Notes (Signed)
Patient c/o ear feeling stopped up. Stated is has been that way six months. MD notified ?

## 2021-06-28 NOTE — Progress Notes (Signed)
Patient sleeping and resting comfortably.  Rounded with primary RN.  No concerns voiced.  No orders required.  Appreciated nightly round. ? ?Today's Vitals  ? 06/27/21 1653 06/27/21 2020 06/28/21 0040 06/28/21 0513  ?BP: (!) 150/62 135/86 121/66 124/72  ?Pulse: 71 72 68 69  ?Resp: '20 20 20 18  '$ ?Temp: 99.4 ?F (37.4 ?C) 98 ?F (36.7 ?C) 97.8 ?F (36.6 ?C) 98 ?F (36.7 ?C)  ?TempSrc: Oral Oral Oral Oral  ?SpO2: 93%     ?Weight:      ?Height:      ?PainSc:  0-No pain    ? ? ?Carollee Leitz, MD ?Family Medicine Residency    ?

## 2021-06-28 NOTE — Plan of Care (Signed)
?  Problem: Education: ?Goal: Ability to demonstrate management of disease process will improve ?Outcome: Progressing ?Goal: Ability to verbalize understanding of medication therapies will improve ?Outcome: Progressing ?Goal: Individualized Educational Video(s) ?Outcome: Progressing ?  ?Problem: Activity: ?Goal: Capacity to carry out activities will improve ?Outcome: Progressing ?  ?Problem: Cardiac: ?Goal: Ability to achieve and maintain adequate cardiopulmonary perfusion will improve ?Outcome: Progressing ?  ?Problem: Clinical Measurements: ?Goal: Ability to avoid or minimize complications of infection will improve ?Outcome: Progressing ?  ?Problem: Skin Integrity: ?Goal: Skin integrity will improve ?Outcome: Progressing ?  ?Problem: Education: ?Goal: Knowledge of disease or condition will improve ?Outcome: Progressing ?Goal: Knowledge of the prescribed therapeutic regimen will improve ?Outcome: Progressing ?  ?Problem: Activity: ?Goal: Ability to tolerate increased activity will improve ?Outcome: Progressing ?Goal: Will verbalize the importance of balancing activity with adequate rest periods ?Outcome: Progressing ?  ?Problem: Respiratory: ?Goal: Ability to maintain a clear airway will improve ?Outcome: Progressing ?Goal: Levels of oxygenation will improve ?Outcome: Progressing ?Goal: Ability to maintain adequate ventilation will improve ?Outcome: Progressing ?  ?Problem: Education: ?Goal: Knowledge of General Education information will improve ?Description: Including pain rating scale, medication(s)/side effects and non-pharmacologic comfort measures ?Outcome: Progressing ?  ?Problem: Health Behavior/Discharge Planning: ?Goal: Ability to manage health-related needs will improve ?Outcome: Progressing ?  ?Problem: Clinical Measurements: ?Goal: Ability to maintain clinical measurements within normal limits will improve ?Outcome: Progressing ?Goal: Will remain free from infection ?Outcome: Progressing ?Goal:  Diagnostic test results will improve ?Outcome: Progressing ?Goal: Respiratory complications will improve ?Outcome: Progressing ?Goal: Cardiovascular complication will be avoided ?Outcome: Progressing ?  ?Problem: Activity: ?Goal: Risk for activity intolerance will decrease ?Outcome: Progressing ?  ?Problem: Nutrition: ?Goal: Adequate nutrition will be maintained ?Outcome: Progressing ?  ?Problem: Coping: ?Goal: Level of anxiety will decrease ?Outcome: Progressing ?  ?Problem: Elimination: ?Goal: Will not experience complications related to bowel motility ?Outcome: Progressing ?Goal: Will not experience complications related to urinary retention ?Outcome: Progressing ?  ?Problem: Pain Managment: ?Goal: General experience of comfort will improve ?Outcome: Progressing ?  ?Problem: Safety: ?Goal: Ability to remain free from injury will improve ?Outcome: Progressing ?  ?Problem: Skin Integrity: ?Goal: Risk for impaired skin integrity will decrease ?Outcome: Progressing ?  ?

## 2021-06-28 NOTE — Progress Notes (Signed)
Family Medicine Teaching Service ?Daily Progress Note ?Intern Pager: [phone] ? ?Patient name: Rodney [name] Medical record number: [id number] ?Date of birth: 1951/04/24 Age: 71 y.o. Gender: male ? ?Primary Care Provider: [name], MD ?Consultants: Vascular surgery (S/O), PCCM (S/O) ?Code Status: Full ? ?Pt Overview and Major Events to Date:  ?2/23-admitted for diarrhea found to be C diff colitis ? ?Assessment and Plan: ?Rodney [name] is a 71 y.o. male who presented with nonbloody, watery diarrhea found to be C. Diff positive which has improved well on antibiotics. PMHx significant for T2DM, obesity, tobacco use, HTN, protein calorie malnutrition, aortic stenosis, anemia of chronic disease, PVD, HFrEF, PAF, CAD, CKD 3, COPD, s/p L BKA.  Patient is medically stable and awaiting placement for SNF. ? ?C/f GI bleed  Anemia of chronic disease with h/o multiple transfusions ?Hgb currently stable at 8.3.  Transfusion threshold 8 given CAD/PAD.  Has received 2 units this hospitalization. There was report of bloody stool yesterday with a clot though I do not see documentation of this. No report of hematochezia overnight or today.  Vitals stable. Holding anticoagulation.  ?- Continue to monitor with daily CBC ?- Resume home iron at D/C ? ?Acute on chronic hypoxic respiratory failure, Resolved  ?Persistent left pleural effusion, Stable  ?COPD stable ?Currently doing well on 2.5 L nasal cannula.  ?- Strict I's and O's, daily weights ?- Continue DuoNebs every 4 hours as needed ?- Continue Breo and Incruse Ellipta ?- Continue home Lasix 40 mg daily ? ?C. difficile colitis, improved ?Patient remains stable, normal WBC and remains afebrile ?- Continue oral Fidaxomicin (day 9/10) ? ?AKI on CKD 3a ?Creatinine  at baseline 1.25 down from 1.55 yesterday. Baseline is 1.2-1.3 ?- Continue to monitor with daily BMP ? ?Chronic conditions stable and managed with home medications as required (T2DM, PAF, GERD, insomnia,  tobacco use). ? ?FEN/GI: Carb modified ?PPx: None due to c/f bleed ? ?Disposition: SNF pending placement, private room available Monday ? ?Subjective:  ?No acute events. Patient laying in bed, glad to hear about placement to [facility] on Monday. Denies any concerns or complaints.  ? ?Objective: ?Temp:  [97.7 ?F (36.5 ?C)-99.4 ?F (37.4 ?C)] 97.7 ?F (36.5 ?C) (03/04 8466) ?Pulse Rate:  [68-72] 70 (03/04 0742) ?Resp:  [18-20] 18 (03/04 0742) ?BP: (114-150)/(62-86) 114/76 (03/04 5993) ?SpO2:  [93 %-98 %] 98 % (03/04 0742) ?Physical Exam: ?General: alert, laying in bed, NAD ?Cardiovascular: RRR ?Respiratory: CTAB normal WOB on 2.5L [place]  ?Abdomen: soft, non distended  ?Extremities: L BKA ? ?Laboratory: ?Recent Labs  ?Lab 06/26/21 ?0427 06/27/21 ?5701 06/27/21 ?7793 06/28/21 ?0131 06/28/21 ?9030  ?WBC 6.2 5.3  --  5.5  --   ?HGB 8.4* 7.9* 8.3* 7.9* 8.3*  ?HCT 26.9* 26.1* 27.0* 25.5* 26.8*  ?PLT 300 301  --  291  --   ? ?Recent Labs  ?Lab 06/26/21 ?0427 06/27/21 ?0923 06/28/21 ?0131  ?NA 134* 133* 135  ?K 4.7 4.4 4.0  ?CL 97* 95* 96*  ?CO2 29 31 34*  ?BUN 30* 30* 29*  ?CREATININE 1.34* 1.55* 1.25*  ?CALCIUM 8.2* 8.2* 8.3*  ?GLUCOSE 143* 175* 94  ? ? ? ?Imaging/Diagnostic Tests: ? ?None new ? ?Shary Key, DO ?06/28/2021, 9:05 AM ?PGY-2, [place] ?[place] Intern pager: [phone], text pages welcome  ?

## 2021-06-29 DIAGNOSIS — J9611 Chronic respiratory failure with hypoxia: Secondary | ICD-10-CM | POA: Diagnosis not present

## 2021-06-29 DIAGNOSIS — A0472 Enterocolitis due to Clostridium difficile, not specified as recurrent: Secondary | ICD-10-CM | POA: Diagnosis not present

## 2021-06-29 DIAGNOSIS — I480 Paroxysmal atrial fibrillation: Secondary | ICD-10-CM | POA: Diagnosis not present

## 2021-06-29 DIAGNOSIS — N179 Acute kidney failure, unspecified: Secondary | ICD-10-CM | POA: Diagnosis not present

## 2021-06-29 LAB — RESP PANEL BY RT-PCR (FLU A&B, COVID) ARPGX2
Influenza A by PCR: NEGATIVE
Influenza B by PCR: NEGATIVE
SARS Coronavirus 2 by RT PCR: NEGATIVE

## 2021-06-29 LAB — GLUCOSE, CAPILLARY
Glucose-Capillary: 145 mg/dL — ABNORMAL HIGH (ref 70–99)
Glucose-Capillary: 154 mg/dL — ABNORMAL HIGH (ref 70–99)
Glucose-Capillary: 176 mg/dL — ABNORMAL HIGH (ref 70–99)
Glucose-Capillary: 166 mg/dL — ABNORMAL HIGH (ref 70–99)

## 2021-06-29 LAB — CBC
HCT: 25 % — ABNORMAL LOW (ref 39.0–52.0)
Hemoglobin: 7.9 g/dL — ABNORMAL LOW (ref 13.0–17.0)
MCH: 27.7 pg (ref 26.0–34.0)
MCHC: 31.6 g/dL (ref 30.0–36.0)
MCV: 87.7 fL (ref 80.0–100.0)
Platelets: 325 10*3/uL (ref 150–400)
RBC: 2.85 MIL/uL — ABNORMAL LOW (ref 4.22–5.81)
RDW: 18 % — ABNORMAL HIGH (ref 11.5–15.5)
WBC: 5.5 10*3/uL (ref 4.0–10.5)
nRBC: 0 % (ref 0.0–0.2)

## 2021-06-29 LAB — BASIC METABOLIC PANEL
Anion gap: 9 (ref 5–15)
BUN: 31 mg/dL — ABNORMAL HIGH (ref 8–23)
CO2: 32 mmol/L (ref 22–32)
Calcium: 8.1 mg/dL — ABNORMAL LOW (ref 8.9–10.3)
Chloride: 93 mmol/L — ABNORMAL LOW (ref 98–111)
Creatinine, Ser: 1.41 mg/dL — ABNORMAL HIGH (ref 0.61–1.24)
GFR, Estimated: 54 mL/min — ABNORMAL LOW (ref >60)
Glucose, Bld: 157 mg/dL — ABNORMAL HIGH (ref 70–99)
Potassium: 4 mmol/L (ref 3.5–5.1)
Sodium: 134 mmol/L — ABNORMAL LOW (ref 135–145)

## 2021-06-29 LAB — HEMOGLOBIN AND HEMATOCRIT, BLOOD
HCT: 25.9 % — ABNORMAL LOW (ref 39.0–52.0)
Hemoglobin: 8 g/dL — ABNORMAL LOW (ref 13.0–17.0)

## 2021-06-29 MED ORDER — SODIUM CHLORIDE 0.9% IV SOLUTION
Freq: Once | INTRAVENOUS | Status: AC
  Administered 2021-06-29: 1 mL via INTRAVENOUS

## 2021-06-29 NOTE — Progress Notes (Signed)
Family Medicine Teaching Service ?Daily Progress Note ?Intern Pager: [phone] ? ?Patient name: Rodney [name] Medical record number: [id number] ?Date of birth: 1951-01-07 Age: 71 y.o. Gender: male ? ?Primary Care Provider: [name], MD ?Consultants: Vascular surgery, PCCM both signed off ?Code Status: Full code ? ?Pt Overview and Major Events to Date:  ?2/23-admitted ? ?Assessment and Plan: ?71 year old male presented initially with nonbloody watery diarrhea found to be C. difficile positive which has improved well on antibiotics.  Past medical history significant for T2DM, obesity, tobacco use, hypertension, protein calorie malnutrition, aortic stenosis, anemia of chronic disease, PVD, HFrEF, PAF, CAD, CKD 3, COPD, status post BKA.  Patient is currently medically stable and awaiting placement with SNF. ? ?Anemia of chronic disease with history of multiple transfusions: ?Hemoglobin today of 7.9.  Was 8.3 yesterday.  Repeat hemoglobin 8.0.  Threshold of 8 for transfusions given CAD/PAD.  He is received 2 units so far during this hospitalization. ?-Daily CBC ?-Resume home iron at time of discharge ? ?Acute on chronic hypoxic respiratory failure which is resolved, persistent left pleural effusion-stable, COPD-stable ?Currently on 2.5liters nasal cannula. ?-Strict I's and O's and daily weights ?-Continue DuoNebs every 4 hours ?-Breo and Incruse Ellipta ?-Continue home Lasix 40 mg daily ? ?C. difficile colitis-improved ?Will complete oral fidaxomicin day 10 of 10 ? ?AKI on CKD 3 AA ?Creatinine 1.41 this morning.  Yesterday's creatinine returned to approximately baseline of 1.25. ?-Daily BMP ? ?FEN/GI: Carb modified ?PPx: None with current concern for bleeding/anemia ?Dispo: Awaiting SNF, has room available for Monday ? ?Subjective:  ?Patient states that blood he noted previously he thinks was due to hemorrhoids.  He denies any blood in his stool since then. ? ?Objective: ?Temp:  [97.7 ?F (36.5 ?C)-98.6 ?F (37  ?C)] 98.3 ?F (36.8 ?C) (03/04 2350) ?Pulse Rate:  [68-77] 70 (03/04 2350) ?Resp:  [17-20] 17 (03/04 2350) ?BP: (105-143)/(66-77) 131/72 (03/04 2350) ?SpO2:  [91 %-98 %] 95 % (03/04 2350) ?Physical Exam: ?General: Elderly male lying in bed ?Cardiovascular: Rhythm appears normal, systolic murmur present ?Respiratory: CTA bilaterally ?Abdomen: Bowel sounds present, no abdominal pain ? ?Laboratory: ?Recent Labs  ?Lab 06/26/21 ?0427 06/27/21 ?3435 06/27/21 ?6861 06/28/21 ?0131 06/28/21 ?6837  ?WBC 6.2 5.3  --  5.5  --   ?HGB 8.4* 7.9* 8.3* 7.9* 8.3*  ?HCT 26.9* 26.1* 27.0* 25.5* 26.8*  ?PLT 300 301  --  291  --   ? ?Recent Labs  ?Lab 06/26/21 ?0427 06/27/21 ?2902 06/28/21 ?0131  ?NA 134* 133* 135  ?K 4.7 4.4 4.0  ?CL 97* 95* 96*  ?CO2 29 31 34*  ?BUN 30* 30* 29*  ?CREATININE 1.34* 1.55* 1.25*  ?CALCIUM 8.2* 8.2* 8.3*  ?GLUCOSE 143* 175* 94  ? ? ? ?Lurline Del, DO ?06/29/2021, 12:11 AM ?PGY-3, [place] ?[place] Intern pager: [phone], text pages welcome  ?

## 2021-06-29 NOTE — Progress Notes (Signed)
FPTS Brief Note ?Reviewed patient's vitals, recent notes.  ?Vitals:  ? 06/28/21 1948 06/28/21 2350  ?BP: (!) 143/77 131/72  ?Pulse: 68 70  ?Resp: 18 17  ?Temp: 98.6 ?F (37 ?C) 98.3 ?F (36.8 ?C)  ?SpO2: 96% 95%  ? ?At this time, no change in plan from day progress note.  ?Sharion Settler, DO ?Page [phone] with questions about this patient.  ? ? ?

## 2021-06-30 ENCOUNTER — Other Ambulatory Visit (HOSPITAL_COMMUNITY): Payer: Self-pay

## 2021-06-30 DIAGNOSIS — I480 Paroxysmal atrial fibrillation: Secondary | ICD-10-CM | POA: Diagnosis not present

## 2021-06-30 DIAGNOSIS — I96 Gangrene, not elsewhere classified: Secondary | ICD-10-CM | POA: Diagnosis not present

## 2021-06-30 DIAGNOSIS — M6281 Muscle weakness (generalized): Secondary | ICD-10-CM | POA: Diagnosis not present

## 2021-06-30 DIAGNOSIS — L89312 Pressure ulcer of right buttock, stage 2: Secondary | ICD-10-CM | POA: Diagnosis not present

## 2021-06-30 DIAGNOSIS — L97518 Non-pressure chronic ulcer of other part of right foot with other specified severity: Secondary | ICD-10-CM | POA: Diagnosis not present

## 2021-06-30 DIAGNOSIS — E440 Moderate protein-calorie malnutrition: Secondary | ICD-10-CM | POA: Diagnosis not present

## 2021-06-30 DIAGNOSIS — I739 Peripheral vascular disease, unspecified: Secondary | ICD-10-CM | POA: Diagnosis not present

## 2021-06-30 DIAGNOSIS — J9611 Chronic respiratory failure with hypoxia: Secondary | ICD-10-CM | POA: Diagnosis not present

## 2021-06-30 DIAGNOSIS — R531 Weakness: Secondary | ICD-10-CM | POA: Diagnosis not present

## 2021-06-30 DIAGNOSIS — I5033 Acute on chronic diastolic (congestive) heart failure: Secondary | ICD-10-CM | POA: Diagnosis not present

## 2021-06-30 DIAGNOSIS — I959 Hypotension, unspecified: Secondary | ICD-10-CM | POA: Diagnosis not present

## 2021-06-30 DIAGNOSIS — I70234 Atherosclerosis of native arteries of right leg with ulceration of heel and midfoot: Secondary | ICD-10-CM | POA: Diagnosis not present

## 2021-06-30 DIAGNOSIS — E1122 Type 2 diabetes mellitus with diabetic chronic kidney disease: Secondary | ICD-10-CM | POA: Diagnosis not present

## 2021-06-30 DIAGNOSIS — Z7401 Bed confinement status: Secondary | ICD-10-CM | POA: Diagnosis not present

## 2021-06-30 DIAGNOSIS — I10 Essential (primary) hypertension: Secondary | ICD-10-CM | POA: Diagnosis not present

## 2021-06-30 DIAGNOSIS — J810 Acute pulmonary edema: Secondary | ICD-10-CM | POA: Diagnosis not present

## 2021-06-30 DIAGNOSIS — J449 Chronic obstructive pulmonary disease, unspecified: Secondary | ICD-10-CM | POA: Diagnosis not present

## 2021-06-30 DIAGNOSIS — J90 Pleural effusion, not elsewhere classified: Secondary | ICD-10-CM | POA: Diagnosis not present

## 2021-06-30 LAB — TYPE AND SCREEN
ABO/RH(D): A POS
Antibody Screen: NEGATIVE
Unit division: 0

## 2021-06-30 LAB — GLUCOSE, CAPILLARY
Glucose-Capillary: 204 mg/dL — ABNORMAL HIGH (ref 70–99)
Glucose-Capillary: 196 mg/dL — ABNORMAL HIGH (ref 70–99)

## 2021-06-30 LAB — HEMOGLOBIN AND HEMATOCRIT, BLOOD
HCT: 27.4 % — ABNORMAL LOW (ref 39.0–52.0)
HCT: 28.4 % — ABNORMAL LOW (ref 39.0–52.0)
Hemoglobin: 8.4 g/dL — ABNORMAL LOW (ref 13.0–17.0)
Hemoglobin: 8.7 g/dL — ABNORMAL LOW (ref 13.0–17.0)

## 2021-06-30 LAB — BPAM RBC
Blood Product Expiration Date: 202303122359
ISSUE DATE / TIME: 202303051452
Unit Type and Rh: 6200

## 2021-06-30 LAB — PREPARE RBC (CROSSMATCH)

## 2021-06-30 MED ORDER — NICOTINE 7 MG/24HR TD PT24
7.0000 mg | MEDICATED_PATCH | Freq: Every day | TRANSDERMAL | 0 refills | Status: DC
  Filled 2021-06-30: qty 28, 28d supply, fill #0

## 2021-06-30 NOTE — TOC Progression Note (Signed)
Transition of Care (TOC) - Progression Note  ? ? ?Patient Details  ?Name: Rodney [name] ?MRN: [id number] ?Date of Birth: Sep 09, 1950 ? ?Transition of Care (TOC) CM/SW Contact  ?Benard Halsted, LCSW ?Phone Number: ?06/30/2021, 9:38 AM ? ?Clinical Narrative:    ?[facility] should have a private room for patient today. Insurance approval received, Ref# [id number] ID# [id number], effective until 07/01/21.  ? ? ?Expected Discharge Plan: [place] ?Barriers to Discharge: Ship broker, SNF Pending bed offer ? ?Expected Discharge Plan and Services ?Expected Discharge Plan: [place] ?In-house Referral: Clinical Social Work ?  ?Post Acute Care Choice: [place] ?Living arrangements for the past 2 months: [place] ?                ?  ?  ?  ?  ?  ?  ?  ?  ?  ?  ? ? ?Social Determinants of Health (SDOH) Interventions ?  ? ?Readmission Risk Interventions ?Readmission Risk Prevention Plan 06/26/2021 05/16/2021 03/26/2021  ?Transportation Screening Complete Complete Complete  ?PCP or Specialist Appt within 3-5 Days - - -  ?[place] or [place] - - -  ?Social Work Consult for [place] Planning/Counseling - - -  ?Palliative Care Screening - - -  ?Medication Review Press photographer) Complete Complete Complete  ?PCP or Specialist appointment within 3-5 days of discharge Complete Complete -  ?[place] or Home Care Consult Complete Complete -  ?SW Recovery Care/Counseling Consult Complete Complete Complete  ?Palliative Care Screening Not Applicable Not Applicable Not Applicable  ?Skilled Nursing Facility Complete Not Applicable Not Applicable  ?Some recent data might be hidden  ? ? ?

## 2021-06-30 NOTE — TOC Transition Note (Signed)
Transition of Care (TOC) - CM/SW Discharge Note ? ? ?Patient Details  ?Name: Rodney [name] ?MRN: [id number] ?Date of Birth: [birth year] ? ?Transition of Care (TOC) CM/SW Contact:  ?Benard Halsted, LCSW ?Phone Number: ?06/30/2021, 12:35 PM ? ? ?Clinical Narrative:    ?Patient will DC to: [facility] ?Anticipated DC date: 06/30/21 ?Family notified: Pt notified family ?Transport by: Corey Harold 3pm ? ? ?Per MD patient ready for DC to [facility] RN to call report prior to discharge [phone] room 104). RN, patient, patient's family, and facility notified of DC. Discharge Summary and FL2 sent to facility. DC packet on chart. Ambulance transport requested for patient.  ? ?CSW will sign off for now as social work intervention is no longer needed. Please consult [place] again if new needs arise. ? ? ? ? ?Final next level of care: [place] ?Barriers to Discharge: Barriers Resolved ? ? ?Patient Goals and CMS Choice ?Patient states their goals for this hospitalization and ongoing recovery are:: Rehab ?CMS Medicare.gov Compare Post Acute Care list provided to:: Patient ?Choice offered to / list presented to : Patient ? ?Discharge Placement ?  ?Existing PASRR number confirmed : 06/30/21          ?Patient chooses bed at: [facility] ?Patient to be transferred to facility by: [facility] ?Name of family member notified: Pt alerted his significant other ?Patient and family notified of of transfer: 06/30/21 ? ?Discharge Plan and Services ?In-house Referral: Clinical Social Work ?  ?Post Acute Care Choice: [place]          ?  ?  ?  ?  ?  ?  ?  ?  ?  ?  ? ?Social Determinants of Health (SDOH) Interventions ?  ? ? ?Readmission Risk Interventions ?Readmission Risk Prevention Plan 06/26/2021 05/16/2021 03/26/2021  ?Transportation Screening Complete Complete Complete  ?PCP or Specialist Appt within 3-5 Days - - -  ?[place] or [place] - - -  ?Social Work Consult for [place] Planning/Counseling - - -   ?Palliative Care Screening - - -  ?Medication Review Press photographer) Complete Complete Complete  ?PCP or Specialist appointment within 3-5 days of discharge Complete Complete -  ?[place] or Home Care Consult Complete Complete -  ?SW Recovery Care/Counseling Consult Complete Complete Complete  ?Palliative Care Screening Not Applicable Not Applicable Not Applicable  ?Skilled Nursing Facility Complete Not Applicable Not Applicable  ?Some recent data might be hidden  ? ? ? ? ? ?

## 2021-06-30 NOTE — Progress Notes (Signed)
Family Medicine Teaching Service ?Daily Progress Note ?Intern Pager: [phone] ? ?Patient name: Rodney [name] Medical record number: [id number] ?Date of birth: Oct 30, 1950 Age: 71 y.o. Gender: male ? ?Primary Care Provider: [name], MD ?Consultants: Vascular surgery, PCCM- both s/o ?Code Status: FULL ? ?Pt Overview and Rodney Events to Date:  ?2/23- Admitted; + C. Diff colitis ?3/6- COVID negative, stable for SNF ? ?Assessment and Plan: ?Rodney [name] is a 71 year-old male with C. Diff colitis s/p PO 10-day Fidaxomicin course, resolved hypoxic respiratory failure requiring BiPAP o admission and anemia of chronic disease requiring 2u pRBC this hospitalization, stable and awaiting SNF placement. ? ?Anemia of chronic disease with hx multiple transfusions; improving ?Hgb 8.4 this morning. 7.9 yesterday and received 1u pRBC. S/p 3u total pRBC transfusion this admission. No clear source/signs of bleeding. Patient says he did not look at his stool to see if there was any blood. ?- Daily CBC ?- Resume home iron at time of discharge ? ?Acute on chronic hypoxic respiratory failure in setting of COPD; resolved ?Persistent left pleural effusion; stable ?Had just received DuoNeb prior to my exam. Wheezing in upper lung fields. On 2.5L [place] with appropriate SpO2. ?- Is/Os ?- DuoNebs q4h ?- Breo and Incruse Ellipta ?- Home Lasix '40mg'$  daily ? ?C difficile colitis- improved ?Completed 10/10 days of PO Fidaxomicin ?- Monitor for worsening sx/signs ? ?FEN/GI: Carb modified ?PPx: None,  ?Dispo:SNF today. Barriers include bed availability, discharge.  ? ?Subjective:  ?Denies any pain, concerns or complaints. Says he just got a breathing treatment and that his breathing feels "so so." ? ?Objective: ?Temp:  [97.5 ?F (36.4 ?C)-98.4 ?F (36.9 ?C)] 97.6 ?F (36.4 ?C) (03/06 0408) ?Pulse Rate:  [67-70] 70 (03/06 0408) ?Resp:  [18-20] 20 (03/06 0408) ?BP: (105-134)/(56-77) 130/76 (03/06 0408) ?SpO2:  [94 %-98 %] 97 % (03/06  0408) ?Physical Exam: ?General: Elderly male laying in bed ?Cardiovascular: RRR ?Respiratory: Wheezing in upper lung fields, faint crackles in lower lobes ?Extremities: Warm, dry, no edema ? ?Laboratory: ?Recent Labs  ?Lab 06/27/21 ?6389 06/27/21 ?3734 06/28/21 ?0131 06/28/21 ?2876 06/29/21 ?0127 06/29/21 ?8115 06/30/21 ?0012  ?WBC 5.3  --  5.5  --  5.5  --   --   ?HGB 7.9*   < > 7.9*   < > 7.9* 8.0* 8.7*  ?HCT 26.1*   < > 25.5*   < > 25.0* 25.9* 28.4*  ?PLT 301  --  291  --  325  --   --   ? < > = values in this interval not displayed.  ? ?Recent Labs  ?Lab 06/27/21 ?7262 06/28/21 ?0131 06/29/21 ?0355  ?NA 133* 135 134*  ?K 4.4 4.0 4.0  ?CL 95* 96* 93*  ?CO2 31 34* 32  ?BUN 30* 29* 31*  ?CREATININE 1.55* 1.25* 1.41*  ?CALCIUM 8.2* 8.3* 8.1*  ?GLUCOSE 175* 94 157*  ? ? ? ?Imaging/Diagnostic Tests: ?No results found. ? ? ?[name], DO ?06/30/2021, 6:52 AM ?PGY-1, [place] ?[place] Intern pager: [phone], text pages welcome  ?

## 2021-06-30 NOTE — Progress Notes (Signed)
Patient sleeping and resting comfortably.  Rounded with primary RN. No reports of repeated episodes of bloody stool overnight. No concerns voiced.  No orders required.  Appreciated nightly round. ? ?Today's Vitals  ? 06/29/21 2025 06/29/21 2137 06/29/21 2221 06/29/21 2345  ?BP: 120/70   124/74  ?Pulse: 70   67  ?Resp: 19   19  ?Temp: 98.4 ?F (36.9 ?C)   98.2 ?F (36.8 ?C)  ?TempSrc: Oral   Oral  ?SpO2: 95%   98%  ?Weight:      ?Height:      ?PainSc:  2  0-No pain   ? ?Body mass index is 36.66 kg/m?.  ? ?GI bleed ?Hbg 8.7 S/p 1 u PRBC yesterday.  Has received total of 3 units during hospitalization. ?Monitor for any bleeding ?Repeat H&H in am, if stable likely will be able to discharge SNF ? ? ?Carollee Leitz, MD ?Family Medicine Residency    ?

## 2021-06-30 NOTE — Progress Notes (Signed)
[name] to be D/C'd Skilled nursing facility per MD order.  Discussed with the patient and all questions fully answered. ? ?VSS, Skin clean, dry and intact without evidence of skin break down, no evidence of skin tears noted. ?IV catheter discontinued intact. Site without signs and symptoms of complications. Dressing and pressure applied. ? ?An After Visit Summary was printed and given to the patient. Patient received prescription. ? ?D/c education completed with patient/family including follow up instructions, medication list, d/c activities limitations if indicated, with other d/c instructions as indicated by MD - patient able to verbalize understanding, all questions fully answered.  ?. ? ?Lillia Abed Teesha Ohm ?06/30/2021 3:07 PM  ?

## 2021-06-30 NOTE — Discharge Summary (Signed)
Rodney [name] Discharge Summary  Patient name: Rodney [name] Medical record number: [id number] Date of birth: 07-Aug-1950 Age: 71 y.o. Gender: male Date of Admission: 06/19/2021  Date of Discharge: 06/30/2021 Admitting Physician: Rodney Covert, MD  Primary Care Provider: [name], MD Consultants: Vascular surgery, PCCM  Indication for Hospitalization: Diarrhea  Discharge Diagnoses/Problem List:  Principal Problem:   C. difficile diarrhea Active Problems:   Essential hypertension   Hypercholesteremia   Tobacco abuse   DM (diabetes mellitus), type 2 with neurological complications (Rodney [name])   Morbid obesity, unspecified obesity type (Rodney [name])   Protein calorie malnutrition (Rodney [name])   PVD (peripheral vascular disease) (Rodney [name])   Anemia of chronic disease   HFrEF (heart failure with reduced ejection fraction) (Rodney [name])   Severe aortic stenosis by prior echocardiogram   AF (paroxysmal atrial fibrillation) (Rodney [name])   S/P BKA (below knee amputation) unilateral, left (Rodney [name])   High risk social situation   CKD stage 3 due to type 2 diabetes mellitus (Rodney [name])   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 (Rodney [name])   Chronic respiratory failure with hypoxia (Rodney [name])   Acute kidney injury superimposed on chronic kidney disease (Rodney [name])   Acute on chronic heart failure with preserved ejection fraction (HFpEF) (Rodney [name])   Diarrhea   Pressure injury of skin   Hypoalbuminemia   Pleural effusion    Disposition: SNF  Discharge Condition: Stable  Discharge Exam:  General: Elderly male laying in bed Cardiovascular: RRR Respiratory: Wheezing in upper lung fields, faint crackles in lower lobes Abdomen: Soft, non-tender Extremities: Warm, dry, no edema  Brief [name] Course:  Rodney [name] is a 71 y.o. male presenting with multiple weeks of nonbloody diarrhea. PMH is significant for T2DM, obesity, tobacco abuse, hypertension, protein calorie malnutrition, aortic stenosis, anemia of  chronic disease, PVD, HFrEF, PAF, s/p BKA, CAD, CKD stage III, COPD.  C. Difficile Colitis Patient presented with 2 weeks of persistent copious diarrhea. Stool sample positive for C. Diff toxin and antigen. Patient was started on 10 day course of Fidaxomicin starting on 06/20/21 and completed the course Patient received fluid boluses while admitted to assist with maintaining fluid status. On the second day of oral fidaxomicin, his diarrhea was markedly improved.   Acute Hypoxemic Respiratory Failure  Pt had acute episode of respiratory on 2/27 and required BiPAP for a short period of time. STAT CXR showed increased pulmonary edema and he was treated with IV Lasix to which he responded swiftly and was transitioned back to supplemental O2 via nasal cannula. Echocardiogram was obtained which showed intact LVEF, indeterminate diastolic function, and IVC with <50% respiratory variability suggesting fluid overload. We therefore dosed him with more IV lasix and he had a He was transitioned to home Lasix 66m BID with good urine output. By time of discharge, he was breathing comfortably on baseline oxygen 2.5L Rodney [name].  Right foot wounds   concern for cellulitis versus erysipelas PVD   CAD   s/p L BKA Patient has chronic right foot wounds and is s/p SFA stenting in January of this year. He recently missed an appointment with Rodney [name] due to weakness/diarrhea from his C. Diff. We therefore consulted them during this hospitalization. They offered amputation of his gangrenous second R toe which he adamantly declined. ABIs and Duplex UKoreashowed patency of his SFA stent. No further intervention by Rodney [name].   Anemia of chronic disease Patient has a known history of anemia and his hemoglobin hovered around his transfusion threshold of 8.0 for  most of the hospitalization. Pt required total 3u transfusion during hospitalization with discharge Hgb 8.4.  Persistent left pleural effusion First noted during his last hospitalization.  Appearance of effusion was stable on CXR this admission. We discussed the possibility of having this drained via thoracentesis, but doing so would require a Plavix washout which would increase the risk of complications with his SFA stent. We therefore elected to manage this expectantly.    Other chronic conditions managed appropriately and stable.   Items for Follow Up:  Recommend repeat CBC Resume iron supplementation  Recommend repeat CXR in 3-4 months to ensure resolution of pleural effusion Follow up with Rodney [name] in 1 month    Significant Labs and Imaging:  Recent Labs  Lab 06/27/21 0058 06/27/21 0649 06/28/21 0131 06/28/21 0539 06/29/21 0127 06/29/21 0546 06/30/21 0012 06/30/21 0619  WBC 5.3  --  5.5  --  5.5  --   --   --   HGB 7.9*   < > 7.9*   < > 7.9* 8.0* 8.7* 8.4*  HCT 26.1*   < > 25.5*   < > 25.0* 25.9* 28.4* 27.4*  PLT 301  --  291  --  325  --   --   --    < > = values in this interval not displayed.   Recent Labs  Lab 06/25/21 0711 06/26/21 0427 06/27/21 0058 06/28/21 0131 06/29/21 0546  NA 137 134* 133* 135 134*  K 4.6 4.7 4.4 4.0 4.0  CL 99 97* 95* 96* 93*  CO2 32 29 31 34* 32  GLUCOSE 137* 143* 175* 94 157*  BUN 28* 30* 30* 29* 31*  CREATININE 1.27* 1.34* 1.55* 1.25* 1.41*  CALCIUM 8.2* 8.2* 8.2* 8.3* 8.1*  MG 1.7 1.6*  --   --   --     Results/Tests Pending at Time of Discharge: None  Discharge Medications:  Allergies as of 06/30/2021   No Known Allergies      Medication List     STOP taking these medications    acetaminophen 650 MG CR tablet Commonly known as: TYLENOL       TAKE these medications    albuterol 108 (90 Base) MCG/ACT inhaler Commonly known as: VENTOLIN HFA INHALE 2 PUFFS INTO THE LUNGS EVERY 6 (SIX) HOURS AS NEEDED FOR WHEEZING OR SHORTNESS OF BREATH.   amiodarone 200 MG tablet Commonly known as: PACERONE Take 1 tablet (200 mg total) by mouth daily.   atorvastatin 80 MG tablet Commonly known as: LIPITOR TAKE 1  TABLET EVERY DAY   BD Pen Needle Nano U/F 32G X 4 MM Misc Generic drug: Insulin Pen Needle Use to inject insulin up to 4 times daily as directed,   carvedilol 12.5 MG tablet Commonly known as: COREG Take 6.25 mg by mouth 2 (two) times daily with a meal.   clopidogrel 75 MG tablet Commonly known as: PLAVIX TAKE 1 TABLET EVERY DAY WITH BREAKFAST What changed: See the new instructions.   FeroSul 325 (65 FE) MG tablet Generic drug: ferrous sulfate Take 1 tablet (325 mg total) by mouth every other day. What changed: when to take this   fluticasone 50 MCG/ACT nasal spray Commonly known as: FLONASE Place 2 sprays into both nostrils daily.   furosemide 40 MG tablet Commonly known as: LASIX Take 1 tablet (40 mg total) by mouth daily as needed for fluid (swelling). What changed: when to take this   gabapentin 300 MG capsule Commonly known as: NEURONTIN Take 600 mg  by mouth at bedtime.   ipratropium-albuterol 0.5-2.5 (3) MG/3ML Soln Commonly known as: DUONEB Take 3 mLs by nebulization every 4 (four) hours as needed.   lactose free nutrition Liqd Take 237 mLs by mouth 3 (three) times a week.   Levemir FlexTouch 100 UNIT/ML FlexPen Generic drug: insulin detemir Inject 25 Units into the skin daily.   metFORMIN 1000 MG tablet Commonly known as: GLUCOPHAGE Take 1 tablet (1,000 mg total) by mouth daily.   nicotine 7 mg/24hr patch Commonly known as: NICODERM CQ - dosed in mg/24 hr Place 1 patch (7 mg total) onto the skin daily. Start taking on: July 01, 2021   pantoprazole 40 MG tablet Commonly known as: Protonix Take 1 tablet (40 mg total) by mouth 2 (two) times daily.   ReliOn True Metrix Test Strips test strip Generic drug: glucose blood Use to test blood sugar three times per day.   traZODone 50 MG tablet Commonly known as: DESYREL Take 0.5-1 tablets (25-50 mg total) by mouth at bedtime as needed for sleep.   Trelegy Ellipta 100-62.5-25 MCG/ACT Aepb Generic drug:  Fluticasone-Umeclidin-Vilant Inhale 1 puff into the lungs daily.   True Metrix Meter Devi Use to test blood sugar three times daily.   True Metrix Meter w/Device Kit USE AS DIRECTED   TRUEplus Lancets 33G Misc Use to test blood sugar three times per day.        Discharge Instructions: Please refer to Patient Instructions section of EMR for full details.  Patient was counseled important signs and symptoms that should prompt return to medical care, changes in medications, dietary instructions, activity restrictions, and follow up appointments.   Follow-Up Appointments:  Contact information for follow-up providers     Hensel, Jamal Collin, MD. Schedule an appointment as soon as possible for a visit in 1 week(s).   Specialty: Family Medicine Contact information: [place] [address] [phone]         Geralynn Rile, MD .   Specialties: Cardiology, Internal Medicine, Radiology Contact information: [place] [address] [phone]         [place]. Go on 07/15/2021.   Contact information: [address] [place] [phone]             Contact information for after-discharge care     Destination     [name] Preferred SNF .   Service: Skilled Nursing Contact information: [address] [place] [phone]                     [name], [place] 06/30/2021, 12:26 PM PGY-1, [place]

## 2021-06-30 NOTE — Progress Notes (Signed)
Physical Therapy Treatment ?Patient Details ?Name: Rodney [name] ?MRN: [id number] ?DOB: [birth year] ?Today's Date: 06/30/2021 ? ? ?History of Present Illness 71 y.o. male presenting 06/19/21 with multiple weeks of nonbloody nonwatery diarrhea. 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 ?  ?PT Comments  ? ? Pt admitted with above diagnosis. Pt was able to sit EOB for 54mn and perform ADL's and some LE exercise. Pt self limiting at times but did do some activities today.  Declines transfers.  Pt needed assist to don/doff post op shoe on right.  Needs continued therapy to progress to prior functional level.  Pt currently with functional limitations due to balance and endurance deficits. Pt will benefit from skilled PT to increase their independence and safety with mobility to allow discharge to the venue listed below.      ?Recommendations for follow up therapy are one component of a multi-disciplinary discharge planning process, led by the attending physician.  Recommendations may be updated based on patient status, additional functional criteria and insurance authorization. ? ?Follow Up Recommendations ? Skilled nursing-short term rehab (<3 hours/day) ?  ?  ?Assistance Recommended at Discharge Intermittent Supervision/Assistance  ?Patient can return home with the following Help with stairs or ramp for entrance;Two people to help with walking and/or transfers;Assist for transportation ?  ?Equipment Recommendations ? None recommended by PT  ?  ?Recommendations for Other Services   ? ? ?  ?Precautions / Restrictions Precautions ?Precautions: Fall ?Required Braces or Orthoses: Other Brace ?Other Brace: left prosthesis but not  with him in the hospital, right cast shoe ?Restrictions ?Weight Bearing Restrictions: No  ?  ? ?Mobility ? Bed Mobility ?Overal bed mobility: Needs Assistance ?Bed Mobility: Supine to Sit, Sit to Supine ?  ?  ?Supine to sit: Mod assist ?Sit to supine: Supervision ?  ?General bed mobility comments: Pt needed assist from therapist to pull his trunk up to sitting from supine position. ?  ? ?Transfers ?Overall transfer level: Needs assistance ?  ?  ?  ?  ?  ?  ?  ? Lateral/Scoot Transfers: Min guard ?General transfer comment: Pt completed 3-4 small scoots up the EOB to the left with min guard, but declined to transfer OOB. ?  ? ?Ambulation/Gait ?  ?  ?  ?  ?  ?  ?  ?  ? ? ?Stairs ?  ?  ?  ?  ?  ? ? ?Wheelchair Mobility ?  ? ?Modified Rankin (Stroke Patients Only) ?  ? ? ?  ?Balance Overall balance assessment: Needs assistance ?Sitting-balance support: Bilateral upper extremity supported, Feet unsupported ?Sitting balance-Leahy Scale: Good ?Sitting balance - Comments: Pt sat up about 25 min at EOB and used shower cap on hair and combed hair. Pt brushed teeth.  Also PT washed pts back and put lotion on it. Pt did a few exercises as well. ?  ?  ?  ?  ?  ?  ?  ?  ?  ?  ?  ?  ?  ?  ?  ?  ? ?  ?Cognition Arousal/Alertness: Awake/alert ?Behavior During Therapy: [facility] assessed/performed ?Overall Cognitive Status: Within Functional Limits for tasks assessed ?  ?  ?  ?  ?  ?  ?  ?  ?  ?  ?  ?  ?  ?  ?  ?  ?  General Comments: Cognition not formally assessed but pt able to engage in appropriate conversation.  Not able to aske specific questions secondary to pt getting slightly agitated. ?  ?  ? ?  ?Exercises General Exercises - Lower Extremity ?Long Arc Quad: AROM, Both, 10 reps, Seated ? ?  ?General Comments General comments (skin integrity, edema, etc.): Pt resistant to participating in certain activities like transfers and standing.  Removed 2.5LO2 and sats to 78%.  REplaced O2 and took incr time for pt to return to 88%   and > and had to incr to 3L.  Notified nurse of incr O2 needs. ?  ?  ? ?Pertinent Vitals/Pain Pain Assessment ?Pain Assessment: Faces ?Faces Pain Scale: Hurts a little bit ?Pain Location: Rt UE ?Pain Descriptors / Indicators: Discomfort ?Pain Intervention(s): Limited activity within patient's tolerance, Monitored during session, Repositioned  ? ? ?Home Living   ?  ?  ?  ?  ?  ?  ?  ?  ?  ?   ?  ?Prior Function    ?  ?  ?   ? ?PT Goals (current goals can now be found in the care plan section) Acute Rehab PT Goals ?Patient Stated Goal: get home ?Progress towards PT goals: Progressing toward goals ? ?  ?Frequency ? ? ? Min 2X/week ? ? ? ?  ?PT Plan Current plan remains appropriate  ? ? ?Co-evaluation PT/OT/SLP Co-Evaluation/Treatment: Yes ?Reason for Co-Treatment: Complexity of the patient's impairments (multi-system involvement);For patient/therapist safety ?PT goals addressed during session: Mobility/safety with mobility ?OT goals addressed during session: ADL's and self-care ?  ? ?  ?AM-PAC PT "6 Clicks" Mobility   ?Outcome Measure ? Help needed turning from your back to your side while in a flat bed without using bedrails?: A Little ?Help needed moving from lying on your back to sitting on the side of a flat bed without using bedrails?: A Lot ?Help needed moving to and from a bed to a chair (including a wheelchair)?: Total ?Help needed standing up from a chair using your arms (e.g., wheelchair or bedside chair)?: Total ?Help needed to walk in hospital room?: Total ?Help needed climbing 3-5 steps with a railing? : Total ?6 Click Score: 9 ? ?  ?End of Session Equipment Utilized During Treatment: Oxygen ?Activity Tolerance: Patient tolerated treatment well (self limiting at times) ?Patient left: in bed;with call bell/phone within reach;with bed alarm set ?Nurse Communication: Mobility status ?PT Visit Diagnosis: Muscle weakness (generalized) (M62.81);Other abnormalities of gait and mobility (R26.89) ?  ? ? ?Time:  4580-9983 ?PT Time Calculation (min) (ACUTE ONLY): 33 min ? ?Charges:  $Therapeutic Activity: 8-22 mins          ?          ? ?Rodney [name],PT ?Acute Rehab Services ?[phone] ?[phone] (pager)  ? ? ?Alvira Philips ?06/30/2021, 1:33 PM ? ?

## 2021-06-30 NOTE — Progress Notes (Signed)
Occupational Therapy Treatment ?Patient Details ?Name: Rodney [name] ?MRN: [id number] ?DOB: 09-23-1950 ?Today's Date: 06/30/2021 ? ? ?History of present illness 71 y.o. male presenting 06/19/21 with multiple weeks of nonbloody nonwatery diarrhea. 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  ?OT comments ? Pt in bed to start session, agreeable to participate in therapy.  Oxygen sats were 95% on 2.5 Ls in supine with transfer to the left EOB with mod assist.  He was able to maintain sitting balance during completion of grooming tasks with therapist donning his right velcro cast shoe prior to transfer to the EOB.  He declined transfer or standing with multiple requests but did agree to scooting up the EOB.  Noted O2 sats decreasing to 78% on room air during grooming tasks.  When 2.5 Ls were replaced via nasal cannula, he still would not come above 85% with purse lip breathing.  Increased O2 to 3Ls with sats increasing up to 88-89.  Nursing made aware.  Will continue to follow for acute OT services for continued progression toward goals, with expectation of discharge to SNF for follow up rehab.    ? ?Recommendations for follow up therapy are one component of a multi-disciplinary discharge planning process, led by the attending physician.  Recommendations may be updated based on patient status, additional functional criteria and insurance authorization. ?   ?Follow Up Recommendations ? Skilled nursing-short term rehab (<3 hours/day)  ?  ?Assistance Recommended at Discharge Frequent or constant Supervision/Assistance  ?Patient can return home with the following ? A little help with bathing/dressing/bathroom;A little help with  walking and/or transfers ?  ?Equipment Recommendations ? None recommended by OT  ?  ?   ?Precautions / Restrictions Precautions ?Precautions: Fall ?Required Braces or Orthoses: Other Brace ?Other Brace: left prosthesis but not with him in the hospital ?Restrictions ?Weight Bearing Restrictions: No  ? ? ?  ? ?Mobility Bed Mobility ?Overal bed mobility: Needs Assistance ?Bed Mobility: Supine to Sit, Sit to Supine ?  ?  ?Supine to sit: Mod assist ?Sit to supine: Supervision ?  ?General bed mobility comments: Pt needed assist from therapist to pull his trunk up to sitting from supine position. ?  ? ?Transfers ?Overall transfer level: Needs assistance ?  ?  ?  ?  ?  ?  ?  ? Lateral/Scoot Transfers: Min guard ?General transfer comment: Pt completed 3-4 small scoots up the EOB to the left with min guard, but declined to transfer OOB. ?  ?  ?Balance   ?  ?  ?  ?  ?  ?  ?  ?  ?  ?  ?  ?  ?  ?  ?  ?  ?  ?  ?   ? ?ADL either performed or assessed with clinical judgement  ? ?ADL Overall ADL's : Needs assistance/impaired ?  ?  ?Grooming: Wash/dry face;Minimal assistance;Brushing hair;Sitting;Oral care ?  ?  ?  ?  ?  ?  ?  ?Lower Body Dressing: Total assistance;Sitting/lateral leans ?Lower Body Dressing Details (indicate cue type and reason): to donn cast shoe only on the RLE ?  ?  ?  ?  ?  ?  ?  ?General ADL Comments: Pt was  able to sit EOB to complete grooming tasks at overall setup level, except min assist for getting tangles out of his hair.  He declined transferring OOB. ?  ? ? ?   ?   ?   ? ?Cognition Arousal/Alertness: Awake/alert ?Behavior During Therapy: [facility] for tasks assessed/performed ?Overall Cognitive Status: Within Functional Limits for tasks assessed ?  ?  ?  ?  ?  ?  ?  ?  ?  ?  ?  ?  ?  ?  ?  ?  ?  ?  ?  ?   ?   ?   ?   ? ? ?Pertinent Vitals/ Pain       Pain Assessment ?Pain Assessment: Faces ?Faces Pain Scale: Hurts a little bit ?Pain Location: Rt UE ?Pain Descriptors / Indicators: Discomfort ?Pain  Intervention(s): Monitored during session ? ?   ?   ? ?Frequency ? Min 2X/week  ? ? ? ? ?  ?Progress Toward Goals ? ?OT Goals(current goals can now be found in the care plan section) ? Progress towards OT goals: Progressing toward goals ? ?Acute Rehab OT Goals ?Patient Stated Goal: To go to rehab at [facility] later today. ?Potential to Achieve Goals: Good  ?Plan Discharge plan remains appropriate   ? ?Co-evaluation ? ? ? PT/OT/SLP Co-Evaluation/Treatment: Yes ?Reason for Co-Treatment: Necessary to address cognition/behavior during functional activity;For patient/therapist safety ?  ?OT goals addressed during session: ADL's and self-care ?  ? ?  ?AM-PAC OT "6 Clicks" Daily Activity     ?Outcome Measure ? ? Help from another person eating meals?: None ?Help from another person taking care of personal grooming?: A Little ?Help from another person toileting, which includes using toliet, bedpan, or urinal?: A Lot ?Help from another person bathing (including washing, rinsing, drying)?: A Lot ?Help from another person to put on and taking off regular upper body clothing?: None ?Help from another person to put on and taking off regular lower body clothing?: A Lot ?6 Click Score: 17 ? ?  ?End of Session Equipment Utilized During Treatment: Oxygen ? ?OT Visit Diagnosis: Muscle weakness (generalized) (M62.81);Pain;Other abnormalities of gait and mobility (R26.89) ?Pain - Right/Left: Right ?Pain - part of body: Arm ?  ?Activity Tolerance Patient tolerated treatment well;Other (comment) (Pt limited session with decline to transfer OOB) ?  ?Patient Left in bed;with call bell/phone within reach;with bed alarm set ?  ?Nurse Communication Mobility status;Other (comment) (O2 status) ?  ? ?   ? ?Time: 6256-3893 ?OT Time Calculation (min): 32 min ? ?Charges: OT General Charges ?$OT Visit: 1 Visit ?OT Treatments ?$Self Care/Home Management : 8-22 mins ? ? ?Jacqualyn Sedgwick OTR/L ?06/30/2021, 12:17 PM ?

## 2021-07-01 DIAGNOSIS — L97518 Non-pressure chronic ulcer of other part of right foot with other specified severity: Secondary | ICD-10-CM | POA: Diagnosis not present

## 2021-07-01 DIAGNOSIS — J9611 Chronic respiratory failure with hypoxia: Secondary | ICD-10-CM | POA: Diagnosis not present

## 2021-07-01 DIAGNOSIS — E440 Moderate protein-calorie malnutrition: Secondary | ICD-10-CM | POA: Diagnosis not present

## 2021-07-01 DIAGNOSIS — L89312 Pressure ulcer of right buttock, stage 2: Secondary | ICD-10-CM | POA: Diagnosis not present

## 2021-07-02 DIAGNOSIS — I739 Peripheral vascular disease, unspecified: Secondary | ICD-10-CM | POA: Diagnosis not present

## 2021-07-02 DIAGNOSIS — I480 Paroxysmal atrial fibrillation: Secondary | ICD-10-CM | POA: Diagnosis not present

## 2021-07-02 DIAGNOSIS — I10 Essential (primary) hypertension: Secondary | ICD-10-CM | POA: Diagnosis not present

## 2021-07-02 DIAGNOSIS — E440 Moderate protein-calorie malnutrition: Secondary | ICD-10-CM | POA: Diagnosis not present

## 2021-07-02 DIAGNOSIS — E1122 Type 2 diabetes mellitus with diabetic chronic kidney disease: Secondary | ICD-10-CM | POA: Diagnosis not present

## 2021-07-02 DIAGNOSIS — I5033 Acute on chronic diastolic (congestive) heart failure: Secondary | ICD-10-CM | POA: Diagnosis not present

## 2021-07-02 DIAGNOSIS — J810 Acute pulmonary edema: Secondary | ICD-10-CM | POA: Diagnosis not present

## 2021-07-02 DIAGNOSIS — J9611 Chronic respiratory failure with hypoxia: Secondary | ICD-10-CM | POA: Diagnosis not present

## 2021-07-02 DIAGNOSIS — J449 Chronic obstructive pulmonary disease, unspecified: Secondary | ICD-10-CM | POA: Diagnosis not present

## 2021-07-03 DIAGNOSIS — J9611 Chronic respiratory failure with hypoxia: Secondary | ICD-10-CM | POA: Diagnosis not present

## 2021-07-03 DIAGNOSIS — I739 Peripheral vascular disease, unspecified: Secondary | ICD-10-CM | POA: Diagnosis not present

## 2021-07-03 DIAGNOSIS — J449 Chronic obstructive pulmonary disease, unspecified: Secondary | ICD-10-CM | POA: Diagnosis not present

## 2021-07-03 DIAGNOSIS — I480 Paroxysmal atrial fibrillation: Secondary | ICD-10-CM | POA: Diagnosis not present

## 2021-07-03 DIAGNOSIS — E1122 Type 2 diabetes mellitus with diabetic chronic kidney disease: Secondary | ICD-10-CM | POA: Diagnosis not present

## 2021-07-03 DIAGNOSIS — I5033 Acute on chronic diastolic (congestive) heart failure: Secondary | ICD-10-CM | POA: Diagnosis not present

## 2021-07-03 DIAGNOSIS — E440 Moderate protein-calorie malnutrition: Secondary | ICD-10-CM | POA: Diagnosis not present

## 2021-07-03 DIAGNOSIS — J810 Acute pulmonary edema: Secondary | ICD-10-CM | POA: Diagnosis not present

## 2021-07-03 DIAGNOSIS — I10 Essential (primary) hypertension: Secondary | ICD-10-CM | POA: Diagnosis not present

## 2021-07-07 ENCOUNTER — Telehealth: Payer: Self-pay

## 2021-07-07 DIAGNOSIS — J9611 Chronic respiratory failure with hypoxia: Secondary | ICD-10-CM | POA: Diagnosis not present

## 2021-07-07 DIAGNOSIS — E440 Moderate protein-calorie malnutrition: Secondary | ICD-10-CM | POA: Diagnosis not present

## 2021-07-07 DIAGNOSIS — L89312 Pressure ulcer of right buttock, stage 2: Secondary | ICD-10-CM | POA: Diagnosis not present

## 2021-07-07 DIAGNOSIS — L97518 Non-pressure chronic ulcer of other part of right foot with other specified severity: Secondary | ICD-10-CM | POA: Diagnosis not present

## 2021-07-07 NOTE — Telephone Encounter (Signed)
Called patient, LVM to call back to reschedule from 03/17- due to an emergency. Left call back number.  ? ? ?

## 2021-07-11 ENCOUNTER — Ambulatory Visit: Payer: Medicare PPO | Admitting: Cardiovascular Disease

## 2021-07-14 ENCOUNTER — Telehealth: Payer: Self-pay

## 2021-07-14 DIAGNOSIS — I70234 Atherosclerosis of native arteries of right leg with ulceration of heel and midfoot: Secondary | ICD-10-CM | POA: Diagnosis not present

## 2021-07-14 DIAGNOSIS — L97518 Non-pressure chronic ulcer of other part of right foot with other specified severity: Secondary | ICD-10-CM | POA: Diagnosis not present

## 2021-07-14 DIAGNOSIS — I96 Gangrene, not elsewhere classified: Secondary | ICD-10-CM | POA: Diagnosis not present

## 2021-07-14 DIAGNOSIS — E440 Moderate protein-calorie malnutrition: Secondary | ICD-10-CM | POA: Diagnosis not present

## 2021-07-14 NOTE — Telephone Encounter (Signed)
Patient's significant other, Rodney [name] calls nurse line regarding request for [employer] to pick up home oxygen supplies.  ? ?Rodney [name] reports that they are now using [employer] for home oxygen.  ? ?Called [employer]. They need a discontinue order on our letter head with provider signature.  ? ?Signed letter faxed to [employer] at [phone]. ? ?Talbot Grumbling, RN ? ?

## 2021-07-14 NOTE — Progress Notes (Deleted)
?Office Note  ? ? ? ?CC:  follow up ?Requesting Provider:  [name], MD ? ?HPI: Rodney [name] is a 71 y.o. (Dec 09, 1950) male who presents for follow up of PAD.  He has history of multiple RLE interventions including right SFA to BK popliteal artery bypass with non reversed GSV on 08/07/2020 by Dr. Donzetta Matters followed by right great toe amputation on 11/15/2020 by Dr. Sharol Given. He had presented to follow up in January of this year with continued wounds on his right foot as well as duplex identification of anastomotic stenosis in the bypass graft. He subsequently underwent Angiogram on 05/12/21 with Dr. Donzetta Matters. He had SFA stent placement at that time. He was scheduled to return for earlier follow up would check but had developed c. Diff.  ? ?He presents today with *** ? ?The pt is on a statin for cholesterol management.  ?The pt is not on a daily aspirin.   Other AC:  Plavix ?The pt is on BB and Diuretic for hypertension.   ?The pt is diabetic.   ?Tobacco hx:  Former, 03/2021 ? ?Past Medical History:  ?Diagnosis Date  ? Acute combined systolic and diastolic congestive heart failure ([place])   ? Acute on chronic heart failure with preserved ejection fraction (HFpEF) ([place])   ? Acute respiratory failure with hypoxia ([place])   ? Aortic stenosis   ? moderate in 2022  ? Atrial fibrillation ([place])   ? CHF (congestive heart failure) ([place])   ? COPD exacerbation ([place]) 12/09/2020  ? Coronary artery disease   ? Demand ischemia ([place]) 03/26/2021  ? Diabetes mellitus without complication ([place])   ? HLD (hyperlipidemia)   ? Hypertension   ? Long term (current) use of anticoagulants 12/29/2019  ? Malnutrition of moderate degree 05/09/2021  ? Peripheral arterial disease ([place])   ? Pneumonia due to COVID-19 virus 05/29/2021  ? ? ?Past Surgical History:  ?Procedure Laterality Date  ? ABDOMINAL AORTOGRAM W/LOWER EXTREMITY N/A 08/05/2020  ? Procedure: ABDOMINAL AORTOGRAM W/LOWER EXTREMITY;  Surgeon: Marty Heck, MD;  Location: [facility];   Service: Cardiovascular;  Laterality: N/A;  ? ABDOMINAL AORTOGRAM W/LOWER EXTREMITY N/A 11/13/2020  ? Procedure: ABDOMINAL AORTOGRAM W/LOWER EXTREMITY;  Surgeon: Cherre Robins, MD;  Location: [facility];  Service: Cardiovascular;  Laterality: N/A;  ? ABDOMINAL AORTOGRAM W/LOWER EXTREMITY N/A 05/12/2021  ? Procedure: ABDOMINAL AORTOGRAM W/LOWER EXTREMITY;  Surgeon: Waynetta Sandy, MD;  Location: [facility];  Service: Cardiovascular;  Laterality: N/A;  ? AMPUTATION Left 09/28/2019  ? Procedure: AMPUTATION BELOW KNEE;  Surgeon: Newt Minion, MD;  Location: [facility];  Service: Orthopedics;  Laterality: Left;  ? AMPUTATION Right 11/15/2020  ? Procedure: RIGHT GREAT TOE AMPUTATION;  Surgeon: Newt Minion, MD;  Location: [facility];  Service: Orthopedics;  Laterality: Right;  ? CARDIAC CATHETERIZATION    ? CARDIOVERSION N/A 10/05/2019  ? Procedure: CARDIOVERSION;  Surgeon: Sanda Klein, MD;  Location: [facility];  Service: Cardiovascular;  Laterality: N/A;  ? FEMORAL-POPLITEAL BYPASS GRAFT Right 08/07/2020  ? Procedure: RIGHT FEMORAL TO BELOW KNEE POPLITEAL ARTERY BYPASS;  Surgeon: Waynetta Sandy, MD;  Location: [facility];  Service: Vascular;  Laterality: Right;  ? LEFT HEART CATH AND CORONARY ANGIOGRAPHY N/A 10/03/2019  ? Procedure: LEFT HEART CATH AND CORONARY ANGIOGRAPHY;  Surgeon: Lorretta Harp, MD;  Location: [facility];  Service: Cardiovascular;  Laterality: N/A;  ? PERIPHERAL VASCULAR INTERVENTION Right 08/05/2020  ? Procedure: PERIPHERAL VASCULAR INTERVENTION;  Surgeon: Marty Heck, MD;  Location: [facility];  Service: Cardiovascular;  Laterality: Right;  common Iliac  ? PERIPHERAL VASCULAR INTERVENTION Left 11/13/2020  ? Procedure: PERIPHERAL VASCULAR INTERVENTION;  Surgeon: Cherre Robins, MD;  Location: [facility];  Service: Cardiovascular;  Laterality: Left;  ? PERIPHERAL VASCULAR INTERVENTION Right 11/14/2020  ? Procedure: PERIPHERAL VASCULAR  INTERVENTION;  Surgeon: Marty Heck, MD;  Location: [facility];  Service: Cardiovascular;  Laterality: Right;  POP/SFA STENT  ? PERIPHERAL VASCULAR INTERVENTION  05/12/2021  ? Procedure: PERIPHERAL VASCULAR INTERVENTION;  Surgeon: Waynetta Sandy, MD;  Location: [facility];  Service: Cardiovascular;;  ? TEE WITHOUT CARDIOVERSION N/A 10/05/2019  ? Procedure: TRANSESOPHAGEAL ECHOCARDIOGRAM (TEE);  Surgeon: Sanda Klein, MD;  Location: [facility];  Service: Cardiovascular;  Laterality: N/A;  ? ? ?Social History  ? ?Socioeconomic History  ? Marital status: Widowed  ?  Spouse name: Not on file  ? Number of children: Not on file  ? Years of education: Not on file  ? Highest education level: Not on file  ?Occupational History  ? Not on file  ?Tobacco Use  ? Smoking status: Former  ?  Packs/day: 0.50  ?  Years: 50.00  ?  Pack years: 25.00  ?  Types: Cigarettes  ?  Start date: 04/09/2021  ? Smokeless tobacco: Never  ?Vaping Use  ? Vaping Use: Never used  ?Substance and Sexual Activity  ? Alcohol use: Yes  ?  Alcohol/week: 6.0 standard drinks  ?  Types: 6 Standard drinks or equivalent per week  ?  Comment: 11/07/20 - states he has not drank in 6 months  ? Drug use: No  ? Sexual activity: Yes  ?  Partners: Female  ?  Comment: monagamous stable relationship  ?Other Topics Concern  ? Not on file  ?Social History Narrative  ? Not on file  ? ?Social Determinants of Health  ? ?Financial Resource Strain: Not on file  ?Food Insecurity: Not on file  ?Transportation Needs: Not on file  ?Physical Activity: Not on file  ?Stress: Not on file  ?Social Connections: Not on file  ?Intimate Partner Violence: Not on file  ? ?*** ?Family History  ?Problem Relation Age of Onset  ? Alcoholism Mother   ? Alcoholism Father   ? ? ?Current Outpatient Medications  ?Medication Sig Dispense Refill  ? albuterol (VENTOLIN HFA) 108 (90 Base) MCG/ACT inhaler INHALE 2 PUFFS INTO THE LUNGS EVERY 6 (SIX) HOURS AS NEEDED FOR  WHEEZING OR SHORTNESS OF BREATH. 1 each 0  ? amiodarone (PACERONE) 200 MG tablet Take 1 tablet (200 mg total) by mouth daily. 90 tablet 3  ? atorvastatin (LIPITOR) 80 MG tablet TAKE 1 TABLET EVERY DAY (Patient taking differently: Take 80 mg by mouth daily.) 90 tablet 0  ? Blood Glucose Monitoring Suppl (TRUE METRIX METER) DEVI Use to test blood sugar three times daily. (Patient not taking: Reported on 05/07/2021) 1 each 0  ? Blood Glucose Monitoring Suppl (TRUE METRIX METER) w/Device KIT USE AS DIRECTED (Patient not taking: Reported on 05/07/2021) 1 kit 0  ? carvedilol (COREG) 12.5 MG tablet Take 6.25 mg by mouth 2 (two) times daily with a meal.    ? clopidogrel (PLAVIX) 75 MG tablet TAKE 1 TABLET EVERY DAY WITH BREAKFAST (Patient taking differently: Take 75 mg by mouth daily.) 90 tablet 3  ? ferrous sulfate 325 (65 FE) MG tablet Take 1 tablet (325 mg total) by mouth every other  day. (Patient taking differently: Take 325 mg by mouth daily with breakfast.) 30 tablet 0  ? fluticasone (FLONASE) 50 MCG/ACT nasal spray Place 2 sprays into both nostrils daily. 16 g 6  ? Fluticasone-Umeclidin-Vilant (TRELEGY ELLIPTA) 100-62.5-25 MCG/ACT AEPB Inhale 1 puff into the lungs daily.    ? furosemide (LASIX) 40 MG tablet Take 1 tablet (40 mg total) by mouth daily as needed for fluid (swelling). (Patient taking differently: Take 40 mg by mouth daily.) 30 tablet 1  ? gabapentin (NEURONTIN) 300 MG capsule Take 600 mg by mouth at bedtime.    ? glucose blood (RELION TRUE METRIX TEST STRIPS) test strip Use to test blood sugar three times per day. (Patient not taking: Reported on 05/07/2021) 300 each 3  ? insulin detemir (LEVEMIR) 100 UNIT/ML FlexPen Inject 25 Units into the skin daily. 15 mL 11  ? Insulin Pen Needle 32G X 4 MM MISC Use to inject insulin up to 4 times daily as directed, 100 each 0  ? ipratropium-albuterol (DUONEB) 0.5-2.5 (3) MG/3ML SOLN Take 3 mLs by nebulization every 4 (four) hours as needed. 300 mL 3  ? lactose free  nutrition (BOOST) LIQD Take 237 mLs by mouth 3 (three) times a week.    ? metFORMIN (GLUCOPHAGE) 1000 MG tablet Take 1 tablet (1,000 mg total) by mouth daily. 90 tablet 3  ? nicotine (NICODERM CQ - DOSED

## 2021-07-15 ENCOUNTER — Telehealth: Payer: Self-pay | Admitting: *Deleted

## 2021-07-15 ENCOUNTER — Ambulatory Visit: Payer: Medicare PPO

## 2021-07-15 DIAGNOSIS — I739 Peripheral vascular disease, unspecified: Secondary | ICD-10-CM

## 2021-07-15 NOTE — Chronic Care Management (AMB) (Signed)
?  Care Management  ? ?Note ? ?07/15/2021 ?Name: Rodney [name] MRN: [id number] DOB: 30-May-1950 ? ?Rodney [name] is a 71 y.o. year old male who is a primary care patient of Hensel, Jamal Collin, MD and is actively engaged with the care management team. I reached out to [name] by phone today to assist with re-scheduling an initial visit with the RN Case Manager ? ?Follow up plan: ?A telephone outreach attempt made. A HIPAA compliant phone message was left for the patient providing contact information and requesting a return call. The care management team will reach out to the patient again over the next 2 days. If patient returns call to provider office, please advise to call [place] at [phone]. ? ?Laverda Sorenson  ?Care Guide, [name] Care Coordination ?[place] [facility]  Care Management  ?Direct Dial: [phone] ? ?

## 2021-07-17 ENCOUNTER — Other Ambulatory Visit: Payer: Self-pay

## 2021-07-17 ENCOUNTER — Encounter: Payer: Self-pay | Admitting: Family Medicine

## 2021-07-17 ENCOUNTER — Telehealth (INDEPENDENT_AMBULATORY_CARE_PROVIDER_SITE_OTHER): Payer: Medicare PPO | Admitting: Family Medicine

## 2021-07-17 DIAGNOSIS — A0472 Enterocolitis due to Clostridium difficile, not specified as recurrent: Secondary | ICD-10-CM

## 2021-07-17 DIAGNOSIS — I739 Peripheral vascular disease, unspecified: Secondary | ICD-10-CM | POA: Diagnosis not present

## 2021-07-17 DIAGNOSIS — I5033 Acute on chronic diastolic (congestive) heart failure: Secondary | ICD-10-CM | POA: Diagnosis not present

## 2021-07-17 DIAGNOSIS — I10 Essential (primary) hypertension: Secondary | ICD-10-CM | POA: Diagnosis not present

## 2021-07-17 DIAGNOSIS — E1149 Type 2 diabetes mellitus with other diabetic neurological complication: Secondary | ICD-10-CM

## 2021-07-17 DIAGNOSIS — I5020 Unspecified systolic (congestive) heart failure: Secondary | ICD-10-CM

## 2021-07-17 DIAGNOSIS — J9611 Chronic respiratory failure with hypoxia: Secondary | ICD-10-CM | POA: Diagnosis not present

## 2021-07-17 DIAGNOSIS — E1122 Type 2 diabetes mellitus with diabetic chronic kidney disease: Secondary | ICD-10-CM | POA: Diagnosis not present

## 2021-07-17 DIAGNOSIS — J810 Acute pulmonary edema: Secondary | ICD-10-CM | POA: Diagnosis not present

## 2021-07-17 DIAGNOSIS — E440 Moderate protein-calorie malnutrition: Secondary | ICD-10-CM | POA: Diagnosis not present

## 2021-07-17 DIAGNOSIS — J449 Chronic obstructive pulmonary disease, unspecified: Secondary | ICD-10-CM | POA: Diagnosis not present

## 2021-07-17 DIAGNOSIS — I480 Paroxysmal atrial fibrillation: Secondary | ICD-10-CM | POA: Diagnosis not present

## 2021-07-17 MED ORDER — INSULIN DETEMIR 100 UNIT/ML FLEXPEN: 25.0000 [IU] | PEN_INJECTOR | Freq: Every day | SUBCUTANEOUS | 11 refills | Status: DC

## 2021-07-17 NOTE — Patient Instructions (Addendum)
Patient will let me know if I need to order PT and home nurse.  Also if I need to change insulin. ?Please don't hesitate to call.   ?See me when you have transportation.  I can do much more in person than over the phone. ?

## 2021-07-17 NOTE — Assessment & Plan Note (Signed)
From best I can tell over phone, euvolemic and current meds.  Some leg swelling but foot is down more.  Able to lay flat in bed.  O2 requirement stable or less.   ?

## 2021-07-17 NOTE — Progress Notes (Signed)
? ? ?  SUBJECTIVE:  ? ?CHIEF COMPLAINT / HPI:  ? ?Phone visit both wife and patient able to participate: Feels well.  DCed from skilled nursing 4 days.  Walking only two steps.  Able to do transfers.  Missed cardiology appointment.  Some swelling of remaining leg.  Able to lay flat.  Still on oxygen.  Remaining foot looks good.  Diabetes is going well.  Blood sugar at nursing home was 130 range.  ?Diarrhea improved.   ? ?Needs insulin pens.  Otherwise has all meds.   ? ?No nurse or physical therapy visits yet.   ? ?  ? ? ? ?OBJECTIVE:  ? ?Wt 222 lb (100.7 kg) Comment: reports last weight in nursing home  BMI 30.11 kg/m?   ?He speaks in complete sentences.  No apparent respiratory distress. ?Affect seems good.   ? ? ?ASSESSMENT/PLAN:  ?Duration of phone call 25 minutes. ?HFrEF (heart failure with reduced ejection fraction) ([place]) ?From best I can tell over phone, euvolemic and current meds.  Some leg swelling but foot is down more.  Able to lay flat in bed.  O2 requirement stable or less.   ? ?C. difficile diarrhea ?resolved ? ?PVD (peripheral vascular disease) ([place]) ?States remaining foot looks good with no skin breakdown. ?  ? ? ?[name], MD ?[place]  ?

## 2021-07-17 NOTE — Assessment & Plan Note (Signed)
States remaining foot looks good with no skin breakdown. ?

## 2021-07-17 NOTE — Assessment & Plan Note (Signed)
resolved 

## 2021-07-18 ENCOUNTER — Telehealth: Payer: Self-pay

## 2021-07-18 NOTE — Telephone Encounter (Signed)
Home Health calls nurse line reporting a scheduled visit for 3/29. ? ?[place] will report any findings to PCP.  ?

## 2021-07-20 ENCOUNTER — Inpatient Hospital Stay (HOSPITAL_COMMUNITY)
Admission: EM | Admit: 2021-07-20 | Discharge: 2021-07-29 | DRG: 177 | Disposition: A | Discharge status: Home Health | Payer: Medicare PPO | Source: Other Acute Inpatient Hospital | Attending: Family Medicine | Admitting: Family Medicine

## 2021-07-20 ENCOUNTER — Encounter (HOSPITAL_COMMUNITY): Payer: Self-pay | Admitting: Emergency Medicine

## 2021-07-20 ENCOUNTER — Other Ambulatory Visit: Payer: Self-pay

## 2021-07-20 ENCOUNTER — Emergency Department (HOSPITAL_COMMUNITY): Payer: Medicare PPO

## 2021-07-20 DIAGNOSIS — J441 Chronic obstructive pulmonary disease with (acute) exacerbation: Principal | ICD-10-CM

## 2021-07-20 DIAGNOSIS — E1122 Type 2 diabetes mellitus with diabetic chronic kidney disease: Secondary | ICD-10-CM | POA: Diagnosis present

## 2021-07-20 DIAGNOSIS — Z86718 Personal history of other venous thrombosis and embolism: Secondary | ICD-10-CM

## 2021-07-20 DIAGNOSIS — F17210 Nicotine dependence, cigarettes, uncomplicated: Secondary | ICD-10-CM | POA: Diagnosis present

## 2021-07-20 DIAGNOSIS — Z811 Family history of alcohol abuse and dependence: Secondary | ICD-10-CM

## 2021-07-20 DIAGNOSIS — I5033 Acute on chronic diastolic (congestive) heart failure: Secondary | ICD-10-CM | POA: Diagnosis not present

## 2021-07-20 DIAGNOSIS — N1831 Chronic kidney disease, stage 3a: Secondary | ICD-10-CM | POA: Diagnosis present

## 2021-07-20 DIAGNOSIS — I9589 Other hypotension: Secondary | ICD-10-CM | POA: Diagnosis present

## 2021-07-20 DIAGNOSIS — I2510 Atherosclerotic heart disease of native coronary artery without angina pectoris: Secondary | ICD-10-CM | POA: Diagnosis present

## 2021-07-20 DIAGNOSIS — G4700 Insomnia, unspecified: Secondary | ICD-10-CM | POA: Diagnosis present

## 2021-07-20 DIAGNOSIS — Z89512 Acquired absence of left leg below knee: Secondary | ICD-10-CM | POA: Diagnosis not present

## 2021-07-20 DIAGNOSIS — J9621 Acute and chronic respiratory failure with hypoxia: Secondary | ICD-10-CM | POA: Diagnosis not present

## 2021-07-20 DIAGNOSIS — J1282 Pneumonia due to coronavirus disease 2019: Secondary | ICD-10-CM | POA: Diagnosis not present

## 2021-07-20 DIAGNOSIS — J189 Pneumonia, unspecified organism: Secondary | ICD-10-CM

## 2021-07-20 DIAGNOSIS — I4892 Unspecified atrial flutter: Secondary | ICD-10-CM

## 2021-07-20 DIAGNOSIS — I130 Hypertensive heart and chronic kidney disease with heart failure and stage 1 through stage 4 chronic kidney disease, or unspecified chronic kidney disease: Secondary | ICD-10-CM | POA: Diagnosis not present

## 2021-07-20 DIAGNOSIS — E7800 Pure hypercholesterolemia, unspecified: Secondary | ICD-10-CM | POA: Diagnosis present

## 2021-07-20 DIAGNOSIS — K219 Gastro-esophageal reflux disease without esophagitis: Secondary | ICD-10-CM | POA: Diagnosis present

## 2021-07-20 DIAGNOSIS — I350 Nonrheumatic aortic (valve) stenosis: Secondary | ICD-10-CM

## 2021-07-20 DIAGNOSIS — Z6830 Body mass index (BMI) 30.0-30.9, adult: Secondary | ICD-10-CM

## 2021-07-20 DIAGNOSIS — J9601 Acute respiratory failure with hypoxia: Secondary | ICD-10-CM | POA: Diagnosis not present

## 2021-07-20 DIAGNOSIS — I480 Paroxysmal atrial fibrillation: Secondary | ICD-10-CM | POA: Diagnosis not present

## 2021-07-20 DIAGNOSIS — Z7984 Long term (current) use of oral hypoglycemic drugs: Secondary | ICD-10-CM

## 2021-07-20 DIAGNOSIS — E1149 Type 2 diabetes mellitus with other diabetic neurological complication: Secondary | ICD-10-CM | POA: Diagnosis present

## 2021-07-20 DIAGNOSIS — E1152 Type 2 diabetes mellitus with diabetic peripheral angiopathy with gangrene: Secondary | ICD-10-CM | POA: Diagnosis present

## 2021-07-20 DIAGNOSIS — Z79899 Other long term (current) drug therapy: Secondary | ICD-10-CM

## 2021-07-20 DIAGNOSIS — U071 COVID-19: Secondary | ICD-10-CM | POA: Diagnosis not present

## 2021-07-20 DIAGNOSIS — D631 Anemia in chronic kidney disease: Secondary | ICD-10-CM | POA: Diagnosis not present

## 2021-07-20 DIAGNOSIS — N183 Chronic kidney disease, stage 3 unspecified: Secondary | ICD-10-CM

## 2021-07-20 DIAGNOSIS — E11649 Type 2 diabetes mellitus with hypoglycemia without coma: Secondary | ICD-10-CM | POA: Diagnosis not present

## 2021-07-20 DIAGNOSIS — J90 Pleural effusion, not elsewhere classified: Secondary | ICD-10-CM | POA: Diagnosis not present

## 2021-07-20 DIAGNOSIS — Z7902 Long term (current) use of antithrombotics/antiplatelets: Secondary | ICD-10-CM | POA: Diagnosis not present

## 2021-07-20 DIAGNOSIS — I5020 Unspecified systolic (congestive) heart failure: Secondary | ICD-10-CM | POA: Diagnosis not present

## 2021-07-20 DIAGNOSIS — Z7951 Long term (current) use of inhaled steroids: Secondary | ICD-10-CM

## 2021-07-20 DIAGNOSIS — R062 Wheezing: Secondary | ICD-10-CM | POA: Diagnosis not present

## 2021-07-20 DIAGNOSIS — I5043 Acute on chronic combined systolic (congestive) and diastolic (congestive) heart failure: Secondary | ICD-10-CM | POA: Diagnosis present

## 2021-07-20 DIAGNOSIS — Z7901 Long term (current) use of anticoagulants: Secondary | ICD-10-CM

## 2021-07-20 DIAGNOSIS — R0602 Shortness of breath: Secondary | ICD-10-CM | POA: Diagnosis not present

## 2021-07-20 DIAGNOSIS — R0689 Other abnormalities of breathing: Secondary | ICD-10-CM | POA: Diagnosis not present

## 2021-07-20 DIAGNOSIS — J9611 Chronic respiratory failure with hypoxia: Secondary | ICD-10-CM

## 2021-07-20 DIAGNOSIS — Z794 Long term (current) use of insulin: Secondary | ICD-10-CM

## 2021-07-20 DIAGNOSIS — E1142 Type 2 diabetes mellitus with diabetic polyneuropathy: Secondary | ICD-10-CM | POA: Diagnosis present

## 2021-07-20 LAB — CBC WITH DIFFERENTIAL/PLATELET
Abs Immature Granulocytes: 0.04 10*3/uL (ref 0.00–0.07)
Basophils Absolute: 0.1 10*3/uL (ref 0.0–0.1)
Basophils Relative: 1 %
Eosinophils Absolute: 0 10*3/uL (ref 0.0–0.5)
Eosinophils Relative: 1 %
HCT: 32.4 % — ABNORMAL LOW (ref 39.0–52.0)
Hemoglobin: 9.8 g/dL — ABNORMAL LOW (ref 13.0–17.0)
Immature Granulocytes: 1 %
Lymphocytes Relative: 9 %
Lymphs Abs: 0.8 10*3/uL (ref 0.7–4.0)
MCH: 29.3 pg (ref 26.0–34.0)
MCHC: 30.2 g/dL (ref 30.0–36.0)
MCV: 97 fL (ref 80.0–100.0)
Monocytes Absolute: 0.6 10*3/uL (ref 0.1–1.0)
Monocytes Relative: 6 %
Neutro Abs: 7.3 10*3/uL (ref 1.7–7.7)
Neutrophils Relative %: 82 %
Platelets: 302 10*3/uL (ref 150–400)
RBC: 3.34 MIL/uL — ABNORMAL LOW (ref 4.22–5.81)
RDW: 19.2 % — ABNORMAL HIGH (ref 11.5–15.5)
WBC: 8.8 10*3/uL (ref 4.0–10.5)
nRBC: 0 % (ref 0.0–0.2)

## 2021-07-20 LAB — BASIC METABOLIC PANEL
Anion gap: 6 (ref 5–15)
BUN: 31 mg/dL — ABNORMAL HIGH (ref 8–23)
CO2: 29 mmol/L (ref 22–32)
Calcium: 8.4 mg/dL — ABNORMAL LOW (ref 8.9–10.3)
Chloride: 102 mmol/L (ref 98–111)
Creatinine, Ser: 1.72 mg/dL — ABNORMAL HIGH (ref 0.61–1.24)
GFR, Estimated: 42 mL/min — ABNORMAL LOW (ref >60)
Glucose, Bld: 177 mg/dL — ABNORMAL HIGH (ref 70–99)
Potassium: 5 mmol/L (ref 3.5–5.1)
Sodium: 137 mmol/L (ref 135–145)

## 2021-07-20 LAB — LACTIC ACID, PLASMA
Lactic Acid, Venous: 1 mmol/L (ref 0.5–1.9)
Lactic Acid, Venous: 0.8 mmol/L (ref 0.5–1.9)

## 2021-07-20 LAB — I-STAT VENOUS BLOOD GAS, ED
Acid-Base Excess: 7 mmol/L — ABNORMAL HIGH (ref 0.0–2.0)
Acid-Base Excess: 5 mmol/L — ABNORMAL HIGH (ref 0.0–2.0)
Bicarbonate: 33.8 mmol/L — ABNORMAL HIGH (ref 20.0–28.0)
Bicarbonate: 30.7 mmol/L — ABNORMAL HIGH (ref 20.0–28.0)
Calcium, Ion: 1.13 mmol/L — ABNORMAL LOW (ref 1.15–1.40)
Calcium, Ion: 1.12 mmol/L — ABNORMAL LOW (ref 1.15–1.40)
HCT: 32 % — ABNORMAL LOW (ref 39.0–52.0)
HCT: 26 % — ABNORMAL LOW (ref 39.0–52.0)
Hemoglobin: 10.9 g/dL — ABNORMAL LOW (ref 13.0–17.0)
Hemoglobin: 8.8 g/dL — ABNORMAL LOW (ref 13.0–17.0)
O2 Saturation: 87 %
O2 Saturation: 94 %
Potassium: 5 mmol/L (ref 3.5–5.1)
Potassium: 4.5 mmol/L (ref 3.5–5.1)
Sodium: 138 mmol/L (ref 135–145)
Sodium: 139 mmol/L (ref 135–145)
TCO2: 36 mmol/L — ABNORMAL HIGH (ref 22–32)
TCO2: 32 mmol/L (ref 22–32)
pCO2, Ven: 60.9 mmHg — ABNORMAL HIGH (ref 44–60)
pCO2, Ven: 53.8 mmHg (ref 44–60)
pH, Ven: 7.353 (ref 7.25–7.43)
pH, Ven: 7.364 (ref 7.25–7.43)
pO2, Ven: 58 mmHg — ABNORMAL HIGH (ref 32–45)
pO2, Ven: 75 mmHg — ABNORMAL HIGH (ref 32–45)

## 2021-07-20 LAB — IRON AND TIBC
Iron: 26 ug/dL — ABNORMAL LOW (ref 45–182)
Saturation Ratios: 10 % — ABNORMAL LOW (ref 17.9–39.5)
TIBC: 269 ug/dL (ref 250–450)
UIBC: 243 ug/dL

## 2021-07-20 LAB — RESP PANEL BY RT-PCR (FLU A&B, COVID) ARPGX2
Influenza A by PCR: NEGATIVE
Influenza B by PCR: NEGATIVE
SARS Coronavirus 2 by RT PCR: POSITIVE — AB

## 2021-07-20 LAB — PROTIME-INR
INR: 1.2 (ref 0.8–1.2)
Prothrombin Time: 15.3 seconds — ABNORMAL HIGH (ref 11.4–15.2)

## 2021-07-20 LAB — BRAIN NATRIURETIC PEPTIDE: B Natriuretic Peptide: 1079.7 pg/mL — ABNORMAL HIGH (ref 0.0–100.0)

## 2021-07-20 LAB — TROPONIN I (HIGH SENSITIVITY)
Troponin I (High Sensitivity): 57 ng/L — ABNORMAL HIGH (ref <18)
Troponin I (High Sensitivity): 75 ng/L — ABNORMAL HIGH (ref <18)

## 2021-07-20 LAB — HEPARIN LEVEL (UNFRACTIONATED): Heparin Unfractionated: 0.25 IU/mL — ABNORMAL LOW (ref 0.30–0.70)

## 2021-07-20 IMAGING — DX DG CHEST 1V PORT
1 series · 1 of 1 positions shown · non-contrast
Comparison: Chest x-ray [DATE].

CLINICAL DATA: 70-year-old male with history of respiratory
distress and shortness of breath.

EXAM:
PORTABLE CHEST 1 VIEW

[chest]
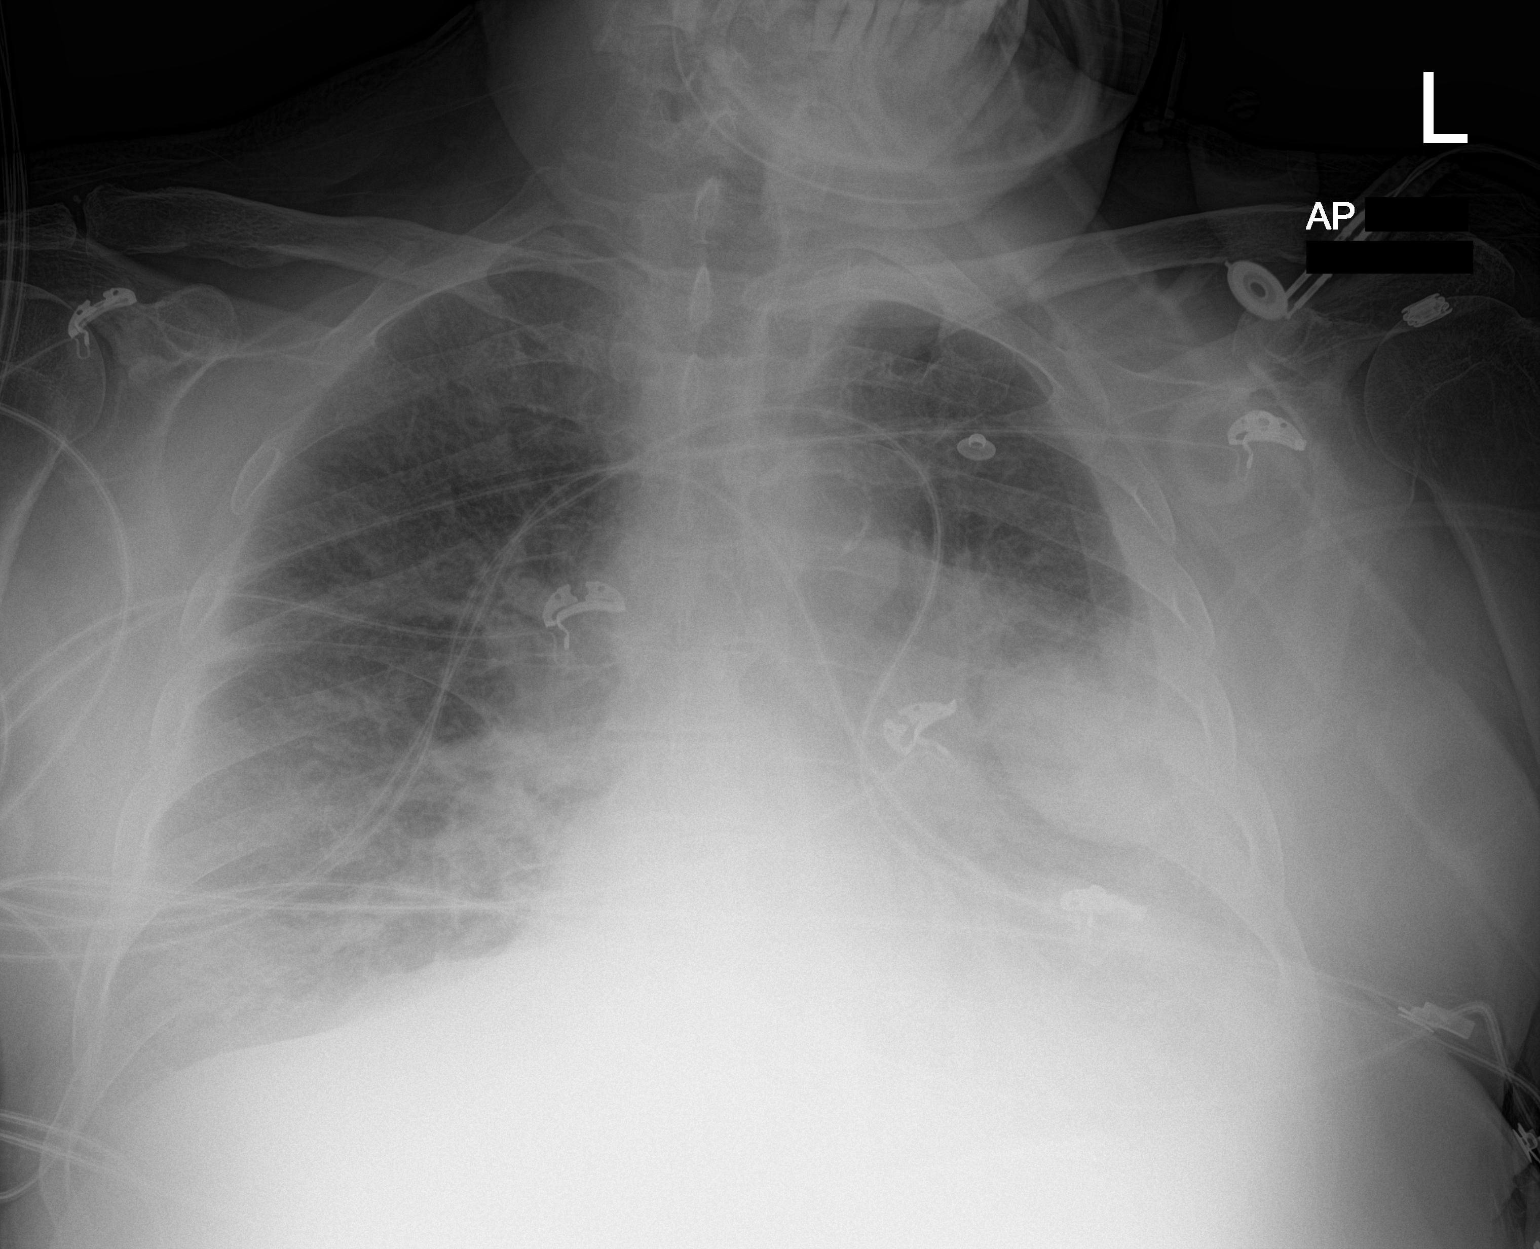

[1 of 1 positions shown; findings below may reference images not displayed]

FINDINGS: Lung volumes are low. Ill-defined opacities and areas of
interstitial prominence are noted throughout the lungs bilaterally,
most severe in the mid to lower lungs (left-greater-than-right).
Moderate left pleural effusion, some of which is likely partially
loculated within the left major fissure. Small right pleural
effusion. No pneumothorax. Pulmonary vasculature and cardiac
silhouette are obscured. Atherosclerotic calcifications in the
thoracic aorta.
IMPRESSION: 1. The appearance the chest is concerning for severe multilobar
bilateral bronchopneumonia.
2. Chronic moderate left pleural effusion partially loculated in the
left major fissure and small right pleural effusion.
3. Aortic atherosclerosis.

## 2021-07-20 MED ORDER — INSULIN DETEMIR 100 UNIT/ML ~~LOC~~ SOLN
25.0000 [IU] | Freq: Every day | SUBCUTANEOUS | Status: DC
  Administered 2021-07-20 – 2021-07-23 (×4): 25 [IU] via SUBCUTANEOUS
  Filled 2021-07-20 (×5): qty 0.25

## 2021-07-20 MED ORDER — SODIUM CHLORIDE 0.9 % IV SOLN
200.0000 mg | Freq: Once | INTRAVENOUS | Status: AC
  Administered 2021-07-20: 200 mg via INTRAVENOUS
  Filled 2021-07-20: qty 40

## 2021-07-20 MED ORDER — AMIODARONE HCL 200 MG PO TABS
200.0000 mg | ORAL_TABLET | Freq: Every day | ORAL | Status: DC
  Administered 2021-07-20 – 2021-07-22 (×3): 200 mg via ORAL
  Filled 2021-07-20 (×3): qty 1

## 2021-07-20 MED ORDER — VANCOMYCIN HCL 2000 MG/400ML IV SOLN
2000.0000 mg | Freq: Once | INTRAVENOUS | Status: AC
  Administered 2021-07-20: 2000 mg via INTRAVENOUS
  Filled 2021-07-20: qty 400

## 2021-07-20 MED ORDER — SODIUM CHLORIDE 0.9 % IV SOLN
100.0000 mg | Freq: Every day | INTRAVENOUS | Status: DC
  Administered 2021-07-21: 100 mg via INTRAVENOUS
  Filled 2021-07-20: qty 20

## 2021-07-20 MED ORDER — FERROUS SULFATE 325 (65 FE) MG PO TABS
325.0000 mg | ORAL_TABLET | ORAL | Status: DC
  Administered 2021-07-20 – 2021-07-28 (×5): 325 mg via ORAL
  Filled 2021-07-20 (×5): qty 1

## 2021-07-20 MED ORDER — HEPARIN (PORCINE) 25000 UT/250ML-% IV SOLN
1650.0000 [IU]/h | INTRAVENOUS | Status: AC
  Administered 2021-07-20: 1500 [IU]/h via INTRAVENOUS
  Administered 2021-07-21 – 2021-07-23 (×5): 1650 [IU]/h via INTRAVENOUS
  Filled 2021-07-20 (×6): qty 250

## 2021-07-20 MED ORDER — SODIUM CHLORIDE 0.9 % IV SOLN
2.0000 g | Freq: Two times a day (BID) | INTRAVENOUS | Status: DC
  Administered 2021-07-20 – 2021-07-22 (×4): 2 g via INTRAVENOUS
  Filled 2021-07-20 (×5): qty 2

## 2021-07-20 MED ORDER — FUROSEMIDE 10 MG/ML IJ SOLN
40.0000 mg | Freq: Every day | INTRAMUSCULAR | Status: DC
  Administered 2021-07-21 – 2021-07-24 (×4): 40 mg via INTRAVENOUS
  Filled 2021-07-20 (×4): qty 4

## 2021-07-20 MED ORDER — IPRATROPIUM-ALBUTEROL 0.5-2.5 (3) MG/3ML IN SOLN
3.0000 mL | RESPIRATORY_TRACT | Status: AC
  Administered 2021-07-20 (×3): 3 mL via RESPIRATORY_TRACT
  Filled 2021-07-20: qty 9

## 2021-07-20 MED ORDER — METHYLPREDNISOLONE SODIUM SUCC 125 MG IJ SOLR: 125.0000 mg | Freq: Once | INTRAMUSCULAR | Status: DC

## 2021-07-20 MED ORDER — MAGNESIUM SULFATE 2 GM/50ML IV SOLN
2.0000 g | Freq: Once | INTRAVENOUS | Status: AC
  Administered 2021-07-20: 2 g via INTRAVENOUS
  Filled 2021-07-20: qty 50

## 2021-07-20 MED ORDER — CLOPIDOGREL BISULFATE 75 MG PO TABS
75.0000 mg | ORAL_TABLET | Freq: Every day | ORAL | Status: DC
  Administered 2021-07-20 – 2021-07-29 (×10): 75 mg via ORAL
  Filled 2021-07-20 (×10): qty 1

## 2021-07-20 MED ORDER — DEXAMETHASONE SODIUM PHOSPHATE 10 MG/ML IJ SOLN
6.0000 mg | Freq: Every day | INTRAMUSCULAR | Status: DC
  Administered 2021-07-20 – 2021-07-22 (×3): 6 mg via INTRAVENOUS
  Filled 2021-07-20 (×3): qty 1

## 2021-07-20 MED ORDER — ATORVASTATIN CALCIUM 80 MG PO TABS
80.0000 mg | ORAL_TABLET | Freq: Every day | ORAL | Status: DC
  Administered 2021-07-20 – 2021-07-29 (×10): 80 mg via ORAL
  Filled 2021-07-20 (×3): qty 1
  Filled 2021-07-20: qty 2
  Filled 2021-07-20 (×6): qty 1

## 2021-07-20 MED ORDER — PIPERACILLIN-TAZOBACTAM 3.375 G IVPB 30 MIN
3.3750 g | Freq: Once | INTRAVENOUS | Status: AC
  Administered 2021-07-20: 3.375 g via INTRAVENOUS
  Filled 2021-07-20: qty 50

## 2021-07-20 MED ORDER — ALBUTEROL SULFATE (2.5 MG/3ML) 0.083% IN NEBU
3.0000 mL | INHALATION_SOLUTION | Freq: Four times a day (QID) | RESPIRATORY_TRACT | Status: DC | PRN
  Administered 2021-07-23: 3 mL via RESPIRATORY_TRACT
  Filled 2021-07-20 (×2): qty 3

## 2021-07-20 MED ORDER — AMIODARONE HCL 200 MG PO TABS: 200.0000 mg | ORAL_TABLET | Freq: Every day | ORAL | Status: DC

## 2021-07-20 MED ORDER — PIPERACILLIN-TAZOBACTAM 3.375 G IVPB: 3.3750 g | Freq: Three times a day (TID) | INTRAVENOUS | Status: DC

## 2021-07-20 MED ORDER — FLUTICASONE FUROATE-VILANTEROL 100-25 MCG/ACT IN AEPB
1.0000 | INHALATION_SPRAY | Freq: Every day | RESPIRATORY_TRACT | Status: DC
Start: 2021-07-20 — End: 2021-07-29
  Administered 2021-07-21 – 2021-07-29 (×9): 1 via RESPIRATORY_TRACT
  Filled 2021-07-20: qty 28

## 2021-07-20 MED ORDER — PANTOPRAZOLE SODIUM 40 MG PO TBEC
40.0000 mg | DELAYED_RELEASE_TABLET | Freq: Two times a day (BID) | ORAL | Status: DC
Start: 2021-07-20 — End: 2021-07-29
  Administered 2021-07-20 – 2021-07-29 (×19): 40 mg via ORAL
  Filled 2021-07-20 (×19): qty 1

## 2021-07-20 MED ORDER — IPRATROPIUM-ALBUTEROL 0.5-2.5 (3) MG/3ML IN SOLN
3.0000 mL | RESPIRATORY_TRACT | Status: DC | PRN
  Administered 2021-07-20 – 2021-07-21 (×3): 3 mL via RESPIRATORY_TRACT
  Filled 2021-07-20 (×4): qty 3

## 2021-07-20 MED ORDER — VANCOMYCIN HCL 1500 MG/300ML IV SOLN
1500.0000 mg | INTRAVENOUS | Status: DC
  Administered 2021-07-21 – 2021-07-22 (×2): 1500 mg via INTRAVENOUS
  Filled 2021-07-20 (×2): qty 300

## 2021-07-20 MED ORDER — GABAPENTIN 300 MG PO CAPS: 600.0000 mg | ORAL_CAPSULE | Freq: Every day | ORAL | Status: DC

## 2021-07-20 MED ORDER — FUROSEMIDE 10 MG/ML IJ SOLN
40.0000 mg | Freq: Once | INTRAMUSCULAR | Status: AC
  Administered 2021-07-20: 40 mg via INTRAVENOUS
  Filled 2021-07-20: qty 4

## 2021-07-20 MED ORDER — UMECLIDINIUM BROMIDE 62.5 MCG/ACT IN AEPB
1.0000 | INHALATION_SPRAY | Freq: Every day | RESPIRATORY_TRACT | Status: DC
Start: 2021-07-20 — End: 2021-07-29
  Administered 2021-07-21 – 2021-07-29 (×9): 1 via RESPIRATORY_TRACT
  Filled 2021-07-20 (×2): qty 7

## 2021-07-20 MED ORDER — ACETAMINOPHEN 500 MG PO TABS
1000.0000 mg | ORAL_TABLET | Freq: Once | ORAL | Status: AC
  Administered 2021-07-20: 1000 mg via ORAL
  Filled 2021-07-20: qty 2

## 2021-07-20 NOTE — Consult Note (Addendum)
?Cardiology Consultation:  ? ?Patient ID: Rodney [name] ?MRN: [id number]; DOB: Oct 25, 1950 ? ?Admit date: 07/20/2021 ?Date of Consult: 07/20/2021 ? ?PCP:  [name], MD ?  ?[facility] Providers ?Cardiologist:  Evalina Field, MD   { ? ?Patient Profile:  ? ?Rodney [name] is a 71 y.o. male with a hx of CAD, aortic stenosis, PAF, hypertension, hyperlipidemia, peripheral arterial disease s/p L BKA, chronic systolic heart failure with improved LVEF, diabetes mellitus and hx of DVT who is being seen 07/20/2021 for the evaluation of chest pain at the request of Dr. Erin Hearing. ? ?Rodney [name] has a history of PAD and underwent BKA in June 2021. He developed Afib postoperatively. Echo at that time showed EF 35-40% and moderate AS. Diagnostic heart cath at that time showed CTO of RCA. He was diagnosed with mixed ICM/NICM. He underwent TEE-guided DCCV 10/05/19. He unfortunately was unable to afford eliquis. When seen in follow up in July 2021, he was back in Afib without anticoagulation.  ? ?He was hospitalized 07/2020 with blisters on R LE and gangrenous toes. He was seen by [facility] for critical right lower extremity ischemia and underwent right superficial femoral artery to below knee popliteal artery bypass. He was readmitted with swelling and blisters, found to have a DVT in the R LE (?coumadin failure, ?precipitated by interruption in [facility] for surgery). He was admitted again in July 2022 with concern for impending bypass graft failure. [facility] was unable to complete a redo revascularization and instead treated bypass with angioplasty and stent in right SFA-pop bypass. Treated for right toe osteomyelitis. He ultimately underwent right great toe amputation. ? ?Last seen by cardiology service September 2022 for elevated troponin in setting of cellulitis and possible CHF and COPD exacerbation.  Echocardiogram that admission showed LV function of 50 to 55%.  Moderate aortic stenosis with mean gradient of 35 mmHg.  No  cardiology follow-up since then. ? ?Admitted 2/23-06/30/21 for C. Diff colitis.  Treated with antibiotic.  Hospital course complicated by acute hypoxic respiratory requiring BiPAP and Lasix for pulmonary edema.  Patient was seen by vascular for chronic right foot wound.  Recommended amputation of his gangrenous second toe but patient declined.  Patient had persistent left pleural effusion.  Did not underwent thoracentesis as it was required Plavix was stopped.  Treated medically. ? ?Echocardiogram 06/23/2021 showed normal LV function at 55 to 60%, no regional wall motion abnormality.  There was severe aortic stenosis with mean gradient of 43 mmHg. Aortic valve area,  by VTI measures 0.75 cm?. Small pericardial effusion.  ? ?Missed cardiology follow up 3/17.  ? ? ?History of Present Illness:  ? ?Rodney [name] presented with 1 week history of shortness of breath which progressively got worse overnight.  He reported cough.  He was given IV Solu-Medrol and nebulizer with improvement.  Chest x-ray showing pneumonia and febrile state.  COVID-positive.  Blood pressure was initially soft, now improved.  He received IV Lasix 40 mg this morning. ? ?Echo 06/23/21 ? 1. Left ventricular ejection fraction, by estimation, is 55 to 60%. The  ?left ventricle has normal function. The left ventricle has no regional  ?wall motion abnormalities. There is mild concentric left ventricular  ?hypertrophy. Left ventricular diastolic  ?parameters are indeterminate.  ? 2. Right ventricular systolic function is normal. The right ventricular  ?size is normal. There is mildly elevated pulmonary artery systolic  ?pressure.  ? 3. Left atrial size was mildly dilated.  ? 4. Right atrial size was  mildly dilated.  ? 5. The mitral valve is grossly normal. Mild mitral valve regurgitation.  ?Moderate mitral annular calcification.  ? 6. The aortic valve is calcified. There is severe calcifcation of the  ?aortic valve. There is severe thickening of the aortic  valve. Aortic valve  ?regurgitation is trivial. Severe aortic valve stenosis. Aortic valve area,  ?by VTI measures 0.75 cm?Marland Kitchen Aortic  ?valve mean gradient measures 43.0 mmHg. Aortic valve Vmax measures 4.30  ?m/s. DI 0.22  ? 7. Aortic dilatation noted. There is borderline dilatation of the aortic  ?root, measuring 36 mm. There is mild dilatation of the ascending aorta,  ?measuring 38 mm.  ? 8. The inferior vena cava is dilated in size with <50% respiratory  ?variability, suggesting right atrial pressure of 15 mmHg.  ? 9. A small pericardial effusion is present. The pericardial effusion is  ?posterior and lateral to the left ventricle.  ? ?Comparison(s): Compared to prior TTE in 02/2021, there continues to be  ?severe aortic stenosis with increase in mean gradient from 15mHg on  ?previous study to 426mg on current study. EF appears slightly better on  ?current study from 50-55% to 55-60%.  ? ?Past Medical History:  ?Diagnosis Date  ? Acute combined systolic and diastolic congestive heart failure ([facility]  ? Acute on chronic heart failure with preserved ejection fraction (HFpEF) ([facility]  ? Acute respiratory failure with hypoxia ([facility]  ? Aortic stenosis   ? moderate in 2022  ? Atrial fibrillation ([facility]  ? CHF (congestive heart failure) ([facility]CL[facility]  ? COPD exacerbation (HC[facility] Point8/15/2022  ? Coronary artery disease   ? Demand ischemia (HCK[place]11/30/2022  ? Diabetes mellitus without complication (HCS[facility]  ? HLD (hyperlipidemia)   ? Hypertension   ? Long term (current) use of anticoagulants 12/29/2019  ? Malnutrition of moderate degree 05/09/2021  ? Peripheral arterial disease ([facility]  ? Pneumonia due to COVID-19 virus 05/29/2021  ? ? ?Past Surgical History:  ?Procedure Laterality Date  ? ABDOMINAL AORTOGRAM W/LOWER EXTREMITY N/A 08/05/2020  ? Procedure: ABDOMINAL AORTOGRAM W/LOWER EXTREMITY;  Surgeon: ClMarty HeckMD;  [facility];  Service: Cardiovascular;  Laterality: N/A;  ? ABDOMINAL AORTOGRAM W/LOWER EXTREMITY  N/A 11/13/2020  ? Procedure: ABDOMINAL AORTOGRAM W/LOWER EXTREMITY;  Surgeon: HaCherre RobinsMD;  [facility];  Service: Cardiovascular;  Laterality: N/A;  ? ABDOMINAL AORTOGRAM W/LOWER EXTREMITY N/A 05/12/2021  ? Procedure: ABDOMINAL AORTOGRAM W/LOWER EXTREMITY;  Surgeon: CaWaynetta SandyMD;  [facility];  Service: Cardiovascular;  Laterality: N/A;  ? AMPUTATION Left 09/28/2019  ? Procedure: AMPUTATION BELOW KNEE;  Surgeon: DuNewt MinionMD;  Location: [facility] Service: Orthopedics;  Laterality: Left;  ? AMPUTATION Right 11/15/2020  ? Procedure: RIGHT GREAT TOE AMPUTATION;  Surgeon: DuNewt MinionMD;  Location: [facility] Service: Orthopedics;  Laterality: Right;  ? CARDIAC CATHETERIZATION    ? CARDIOVERSION N/A 10/05/2019  ? Procedure: CARDIOVERSION;  Surgeon: CrSanda KleinMD;  [facility];  Service: Cardiovascular;  Laterality: N/A;  ? FEMORAL-POPLITEAL BYPASS GRAFT Right 08/07/2020  ? Procedure: RIGHT FEMORAL TO BELOW KNEE POPLITEAL ARTERY BYPASS;  Surgeon: CaWaynetta SandyMD;  [facility]: [facility] Service: Vascular;  Laterality: Right;  ? LEFT HEART CATH AND CORONARY ANGIOGRAPHY N/A 10/03/2019  ? Procedure: LEFT HEART CATH AND CORONARY ANGIOGRAPHY;  Surgeon: BeLorretta HarpMD;  [facility];  Service: Cardiovascular;  Laterality: N/A;  ?  PERIPHERAL VASCULAR INTERVENTION Right 08/05/2020  ? Procedure: PERIPHERAL VASCULAR INTERVENTION;  Surgeon: Marty Heck, MD;  Location: [place] [facility];  Service: Cardiovascular;  Laterality: Right;  common Iliac  ? PERIPHERAL VASCULAR INTERVENTION Left 11/13/2020  ? Procedure: PERIPHERAL VASCULAR INTERVENTION;  Surgeon: Cherre Robins, MD;  Location: [facility] [facility];  Service: Cardiovascular;  Laterality: Left;  ? PERIPHERAL VASCULAR INTERVENTION Right 11/14/2020  ? Procedure: PERIPHERAL VASCULAR INTERVENTION;  Surgeon: Marty Heck, MD;  Location: [facility] [facility];   Service: Cardiovascular;  Laterality: Right;  POP/SFA STENT  ? PERIPHERAL VASCULAR INTERVENTION  05/12/2021  ? Procedure: PERIPHERAL VASCULAR INTERVENTION;  Surgeon: Waynetta Sandy, MD;  Location: [facility]

## 2021-07-20 NOTE — ED Notes (Signed)
[place] applied for rectal temp 94.6, admitting paged, no new orders. ?

## 2021-07-20 NOTE — Progress Notes (Signed)
ANTICOAGULATION CONSULT NOTE  ? ?Pharmacy Consult for Heparin ?Indication: atrial fibrillation ? ?No Known Allergies ? ?Patient Measurements: ?Height: 6' (182.9 cm) ?Weight: 100.7 kg (222 lb 0.1 oz) ?IBW/kg (Calculated) : 77.6 ?Heparin Dosing Weight: 98.1 kg ? ?Vital Signs: ?Temp: 97.5 ?F (36.4 ?C) (03/26 2100) ?Temp Source: Oral (03/26 2100) ?BP: 118/71 (03/26 2100) ?Pulse Rate: 71 (03/26 2100) ? ?Labs: ?Recent Labs  ?  07/20/21 ?0435 07/20/21 ?9417 07/20/21 ?0749 07/20/21 ?1236 07/20/21 ?1526 07/20/21 ?2149  ?HGB 9.8* 10.9*  --  8.8*  --   --   ?HCT 32.4* 32.0*  --  26.0*  --   --   ?PLT 302  --   --   --   --   --   ?LABPROT  --   --   --   --  15.3*  --   ?INR  --   --   --   --  1.2  --   ?HEPARINUNFRC  --   --   --   --   --  0.25*  ?CREATININE 1.72*  --   --   --   --   --   ?TROPONINIHS 57*  --  75*  --   --   --   ? ? ? ?Estimated Creatinine Clearance: 49.8 mL/min (A) (by C-G formula based on SCr of 1.72 mg/dL (H)). ? ? ?Assessment: ?68 yom with a history of CAD, aortic stenosis, PAF, hypertension, hyperlipidemia, peripheral arterial disease s/p L BKA, chronic systolic heart failure with improved LVEF, diabetes mellitus and hx of DVT . Patient is presenting with chest pain. Heparin per pharmacy consult placed for atrial fibrillation. ? ?Patient is not on anticoagulation prior to arrival. ? ?Hgb 8.8 near BL; plt 302 ? ?Initial heparin level 0.25 ? ?Goal of Therapy:  ?Heparin level 0.3-0.7 units/ml ?Monitor platelets by anticoagulation protocol: Yes ?  ?Plan:  ?Increase heparin to 1650 units / hr ?Next heparin level In am ? ?Thank you ?Anette Guarneri, PharmD ? ?07/20/2021 10:44 PM ? ? ?  ?

## 2021-07-20 NOTE — Progress Notes (Signed)
Pharmacy Antibiotic Note ? ?Rodney [name] is a 71 y.o. male admitted on 07/20/2021 with pneumonia.  Pharmacy has been consulted for To change Zosyn to Cefepime, continue Vancomycin dosing. WBC WNL. Noted renal dysfunction.  ? ?Plan: ?Discontinue Zosyn ?Cefepime 2g IV every 12 hours ?Continue Vancomycin 1500 mg IV q24h ?>>>Estimated AUC: 504 ?Trend WBC, temp, renal function - borderline for dose adjustment ?F/U infectious work-up ?Drug levels as indicated ? ? ?Height: 6' (182.9 cm) ?Weight: 100.7 kg (222 lb 0.1 oz) ?IBW/kg (Calculated) : 77.6 ? ?Temp (24hrs), Avg:101.9 ?F (38.8 ?C), Min:101.9 ?F (38.8 ?C), Max:101.9 ?F (38.8 ?C) ? ?Recent Labs  ?Lab 07/20/21 ?3299  ?WBC 8.8  ?CREATININE 1.72*  ?LATICACIDVEN 1.0  ?  ?Estimated Creatinine Clearance: 49.8 mL/min (A) (by C-G formula based on SCr of 1.72 mg/dL (H)).   ? ?No Known Allergies ? ?Sloan Leiter, PharmD ?Clinical Pharmacist ?Please refer to [facility] for [place] numbers ?07/20/2021, 7:22 AM ? ? ? ?

## 2021-07-20 NOTE — H&P (Signed)
Family Medicine Teaching Service ?Hospital Admission History and Physical ?Service Pager: [phone] ? ?Patient name: Rodney [name] Medical record number: [id number] ?Date of birth: December 09, 1950 Age: 71 y.o. Gender: male ? ?Primary Care Provider: [name], MD ?Consultants: None ?Code Status: Full ?Preferred Emergency Contact:  ? Name Relation Home Work Mobile  ? Stevens,[name] Significant other [phone]  [phone]  ? ?Chief Complaint: Shortness of breath ? ?Assessment and Plan: ?Rodney [name] is a 71 y.o. male presenting with shortness of breath x1 week. PMH is significant for type 2 diabetes, obesity, tobacco abuse, hypertension, protein calorie malnutrition, aortic stenosis, anemia of chronic disease, PVD, HFrEF, PAF, s/p BKA, CAD, CKD stage III, COPD ? ?Respiratory distress 2/2 PNA  COPD  questionable CHF  COVID-19 positive ?Patient arrived febrile and in respiratory distress requiring BiPap. In the ER, received Duonebs, steroids, and magnesium with improvement. Patient also had blood cultures drawn and was started on IV antibiotics (Vanc and Zosyn). CXR with severe multilobar b/l bronchopneumonia.  VBG with pH 7.35, CO2 60.9, bicarb 33.8.  Patient did initiate BiPAP in the emergency department.  Initial labs show BNP elevated at >1000.  Troponin also mildly elevated at 57.  Patient with anemia but this appears to be at baseline. Patient's shortness of breath could be caused by a few factors.  X-ray does suggest pneumonia as the cause and the patient was febrile on admission strongly suggesting pneumonia, however his elevated BNP can suggest a heart failure picture.  Patient also has a history of COPD.  Overall symptoms are likely multifactorial.  Will initiate treatment for CHF exacerbation as well as pneumonia and COVID.  On physical exam patient is on BiPap with diffuse rhonchi and peripheral edema, suggesting mixed picture of fluid overload .  Labs returned showing he is COVID-19  positive. (was positive in the last few months, but tested negative 3 weeks ago, so possibly new COVID). Will need close monitoring with low threshold to discuss case with CCM.  ?Home medications include DuoNeb 3 mL every 4 hours as needed, Trelegy Ellipta 1 puff daily, albuterol 2 puffs every 6 hours as needed ?- Admit to [facility], attending Dr. Erin Hearing ?- Follow-up blood cultures ?- Strict I's and O's ?- Daily weights ?- AM CBC ?- AM BMP ?- Continue BiPAP ?- Continuous pulse ox ?- Antibiotics: Vanc and Zosyn, will change to vanc and cefepime to decrease kidney injury potential ?- Continuous cardiac monitoring ?- DuoNeb q4h PRN ?- Albuterol 2 puffs q6h PRN ?- Trelegy Ellipta daily ?- Remdesivir x5 days ?- Decadron x10 days ? ?HFpEF  Severe AS ?Last echo 06/23/2021 with ejection fraction 55-60%, no regional wall abnormalities, mild concentric left ventricular hypertrophy and indeterminate diastolic parameters.  There was noted to have severe calcification of the aortic valve with severe thickening of the aortic valve with the aortic valve area measuring 0.75 cm?.  Physical exam with significant murmur, lower blood pressures, and fluid overload.    Home medications include Lasix 40 mg daily and Coreg 6.25 mg twice daily. Will consult cardiology for assistance with fluid management given aortic stenosis and tenuous clinic picture with lower blood pressures.  ?- Strict I/Os ?- Daily weights ?- Holding coreg due to lower blood pressures ?- Lasix '40mg'$  IV, will reevaluate his urine output to determine if we will continue in this dosing ?- Will consult with cardiology for assistance  ? ?Elevated creatinine CKD stage III ?Creatinine of 1.72, baseline appears to be 1.3-1.6.   ?- AM CMP ?- Avoid  nephrotoxic agents ? ?PVD  CAD  s/p L BKA ?Home medications include Plavix '75mg'$  daily and Atorvastatin '80mg'$  daily ?- Continue home Plavix ?- Continue home Atorvastatin  ? ?Normocytic Anemia, likely 2/2 CKD ?Hemoglobin 9.8>10.9 on  admission.  Patient takes ferrous sulfate 325 mg daily.  ?- AM CBC ?- Continue home iron  ?  ?PAF ?Chronic and stable.  Home meds include amiodarone 200 mg daily. Not currently in afib.  ?- Cardiac monitoring ?- Continue home amiodarone ? ?T2DM ?Last A1c of 6.9 on 03/24/2021.  Home medications include gabapentin 600 mg at bedtime, Levemir 25 units daily, metformin 1000 mg daily ?- Sensitive SSI ?- CBG monitoring with meals and nightly ?- Continue home Levemir 25Units daily ?  ?GERD ?- Continue home Protonix 40 mg twice daily.   ?  ?Insomnia ?- Consider trazodone 25-50 mg at bedtime PRN for sleep. Holding currently due to mental and respiratory status ?  ?Tobacco abuse ?Smokes about 4-5 cigarettes per day. ?- Consider nicotine patch if requested ? ?FEN/GI: N.p.o. while on BiPAP ?Prophylaxis: SCDs, on anticoagulation ? ?Disposition: Progressive ? ?History of Present Illness:  Rodney [name] is a 71 y.o. male presenting with shortness of breath x1 week. ? ?He had endorsed increased coughing and tried some of his COPD medications at home without a lot of improvement.  In the emergency department patient was placed on CPAP and had some improvement with his respiratory distress.  He was also given Solu-Medrol and nebulizers with improvement.  Had chest x-ray in emergency department that suggested pneumonia as well as the fact that he was febrile on admission. ? ? ?Review Of Systems: Per HPI with the following additions:  ? ?Review of Systems  ?Reason unable to perform ROS: difficulty speaking while on BiPAP.   ? ?Patient Active Problem List  ? Diagnosis Date Noted  ? Pleural effusion   ? Pressure injury of skin 06/20/2021  ? C. difficile diarrhea 06/20/2021  ? BMI 36.0-36.9,adult 06/20/2021  ? Hypoalbuminemia 06/20/2021  ? Diarrhea 06/19/2021  ? Acute on chronic heart failure with preserved ejection fraction (HFpEF) ([place])   ? Chronic respiratory failure with hypoxia ([place]) 03/26/2021  ? Acute kidney injury superimposed  on chronic kidney disease ([place]) 03/26/2021  ? Peripheral neuropathy 11/11/2020  ? History of DVT (deep vein thrombosis) 11/10/2020  ? Acute on chronic anemia   ? Ascending aorta dilation ([place]) 08/04/2020  ? 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  ? CAD (coronary artery disease) 02/15/2020  ? CKD stage 3 due to type 2 diabetes mellitus ([place]) 02/15/2020  ? S/P BKA (below knee amputation) unilateral, left ([place]) 11/14/2019  ? High risk social situation 11/14/2019  ? AF (paroxysmal atrial fibrillation) ([place]) 10/03/2019  ? HFrEF (heart failure with reduced ejection fraction) ([place]) 09/28/2019  ? Severe aortic stenosis by prior echocardiogram 09/28/2019  ? Anemia of chronic disease 09/27/2019  ? Protein calorie malnutrition ([place])   ? PVD (peripheral vascular disease) ([place])   ? Umbilical hernia 87/56/4332  ? Morbid obesity, unspecified obesity type ([place]) 10/09/2015  ? Tobacco abuse 09/07/2013  ? DM (diabetes mellitus), type 2 with neurological complications ([place]) 95/18/8416  ? Hypercholesteremia 10/28/2010  ? ERECTILE DYSFUNCTION 05/22/2009  ? Essential hypertension 01/23/2009  ? ? ?Past Medical History: ?Past Medical History:  ?Diagnosis Date  ? Acute combined systolic and diastolic congestive heart failure ([place])   ? Acute on chronic heart failure with preserved ejection fraction (HFpEF) ([place])   ?  Acute respiratory failure with hypoxia ([place])   ? Aortic stenosis   ? moderate in 2022  ? Atrial fibrillation ([place])   ? CHF (congestive heart failure) ([place])   ? COPD exacerbation ([place]) 12/09/2020  ? Coronary artery disease   ? Demand ischemia ([place]) 03/26/2021  ? Diabetes mellitus without complication ([place])   ? HLD (hyperlipidemia)   ? Hypertension   ? Long term (current) use of anticoagulants 12/29/2019  ? Malnutrition of moderate degree 05/09/2021  ? Peripheral arterial disease ([place])   ? Pneumonia due to COVID-19 virus 05/29/2021  ? ? ?Past Surgical History: ?Past Surgical History:  ?Procedure  Laterality Date  ? ABDOMINAL AORTOGRAM W/LOWER EXTREMITY N/A 08/05/2020  ? Procedure: ABDOMINAL AORTOGRAM W/LOWER EXTREMITY;  Surgeon: Marty Heck, MD;  Location: [facility];  Service: Cardiovascular;  L

## 2021-07-20 NOTE — Progress Notes (Signed)
Called significant other [place] to discuss patient and goals of care.  She states that they discussed that she is the healthcare power of attorney, and she has the paperwork but has not gotten it notarized yet.  States that his daughter will come see him because she cannot stand to see him like this.  She states that patient did go to SNF after his last hospitalization, but that insurance would only cover 14 days.  That they do have a hospital bed at home which is helpful and PT that comes to the house.  States that they are open to SNF if he is able to get approved for it, because it is hard for her to care for him, and she cannot lift him.  She was appreciative of call, and denied any further questions or concerns. ?

## 2021-07-20 NOTE — Progress Notes (Signed)
07/20/2021 Patient transport from the emergency room to [facility] at 1730. He is alert, oriented, to person, time place and situation. Patient skin was assess, noted to have swelling on right lower leg and feet. Eschar on 2nd, 4th and 5th toes. Patient left hip abrasion noted. Patient received CHG bath, placed on progressive monitor and central telemetry was aware patient arrived on unit. Rico Sheehan RN ?

## 2021-07-20 NOTE — Progress Notes (Signed)
Pharmacy Antibiotic Note ? ?NOX TALENT is a 71 y.o. male admitted on 07/20/2021 with pneumonia.  Pharmacy has been consulted for Vancomycin/Zosyn dosing. WBC WNL. Noted renal dysfunction.  ? ?Plan: ?Vancomycin 1500 mg IV q24h ?>>>Estimated AUC: 504 ?Zosyn 3.375G IV q8h to be infused over 4 hours ?Trend WBC, temp, renal function  ?F/U infectious work-up ?Drug levels as indicated ? ? ?Height: 6' (182.9 cm) ?Weight: 100.7 kg (222 lb 0.1 oz) ?IBW/kg (Calculated) : 77.6 ? ?Temp (24hrs), Avg:101.9 ?F (38.8 ?C), Min:101.9 ?F (38.8 ?C), Max:101.9 ?F (38.8 ?C) ? ?Recent Labs  ?Lab 07/20/21 ?1740  ?WBC 8.8  ?CREATININE 1.72*  ?LATICACIDVEN 1.0  ?  ?Estimated Creatinine Clearance: 49.8 mL/min (A) (by C-G formula based on SCr of 1.72 mg/dL (H)).   ? ?No Known Allergies ? ?Narda Bonds, PharmD, BCPS ?Clinical Pharmacist ?Phone: [phone] ? ? ?

## 2021-07-20 NOTE — Plan of Care (Signed)

## 2021-07-20 NOTE — ED Triage Notes (Signed)
?  [facility] BIB [name] for [facility] that started earlier tonight.  Patient has hx of COPD/CHF.  Patient gave himself 2 breathing treatments at home prior to EMS arrival and EMS gave 125 solumedrol in route.  Patient alert but unable to answer questions due to respiratory effort. ?

## 2021-07-20 NOTE — Progress Notes (Signed)
FPTS Brief Progress Note ? ?S: Went to evaluate patient.  Patient was sleeping when we entered the room.  On 3 L nasal cannula.  Easily awoken.  Reported that he felt like his breathing had improved greatly.  Denies any concerns at this time. ? ? ?O: ?BP 118/71 (BP Location: Right Arm)   Pulse 71   Temp (!) 97.5 ?F (36.4 ?C) (Oral)   Resp 14   Ht 6' (1.829 m)   Wt 100.7 kg   SpO2 97%   BMI 30.11 kg/m?   ?General: Pleasant 71 year old male resting comfortably ?Cardiac: Regular rate and rhythm, high-pitched systolic murmur heard ?Respiratory: Normal work of breathing on 3 L nasal cannula, speaks in full sentences without difficulty ?Extremities: Patient with considerable lower extremity edema and right lower extremity with wounds (see image below) BKA on left lower extremity ? ? ? ? ? ?A/P: ?Respiratory distress ?Improved respiratory status.  No longer on BiPAP.  We will continue to monitor O2 sat and work of breathing ? ?Lower extremity wounds-appears to have gangrene of the second digit of the right lower extremity.  This was present on previous admissions.  Does not appear effective at this time and may be slightly improved.  Continue to monitor. ? ?- Orders reviewed. Labs for AM ordered, which was adjusted as needed.  ? ?Gifford Shave, MD ?07/20/2021, 11:10 PM ?PGY-3, [place] Night Resident  ?Please page [phone] with questions.  ? ? ?

## 2021-07-20 NOTE — ED Notes (Signed)
RN at bedside to start antibiotics. Pt is resting with eyes closed, responsive to voice but then quickly falls back asleep. Pt appears to be very lethargic. RN attempted to get another oral temp 3 times, pt unable to hold thermometer under tongue as he keeps falling back asleep. Pt's BP 90/50. RN paged admitting. Will continue to monitor mental status and BP. ?

## 2021-07-20 NOTE — Progress Notes (Signed)
ANTICOAGULATION CONSULT NOTE - Initial Consult ? ?Pharmacy Consult for Heparin ?Indication: atrial fibrillation ? ?No Known Allergies ? ?Patient Measurements: ?Height: 6' (182.9 cm) ?Weight: 100.7 kg (222 lb 0.1 oz) ?IBW/kg (Calculated) : 77.6 ?Heparin Dosing Weight: 98.1 kg ? ?Vital Signs: ?Temp: 101.9 ?F (38.8 ?C) (03/26 5449) ?Temp Source: Axillary (03/26 0432) ?BP: 121/64 (03/26 1415) ?Pulse Rate: 65 (03/26 1415) ? ?Labs: ?Recent Labs  ?  07/20/21 ?0435 07/20/21 ?2010 07/20/21 ?0749 07/20/21 ?1236  ?HGB 9.8* 10.9*  --  8.8*  ?HCT 32.4* 32.0*  --  26.0*  ?PLT 302  --   --   --   ?CREATININE 1.72*  --   --   --   ?TROPONINIHS 57*  --  75*  --   ? ? ?Estimated Creatinine Clearance: 49.8 mL/min (A) (by C-G formula based on SCr of 1.72 mg/dL (H)). ? ? ?Medical History: ?Past Medical History:  ?Diagnosis Date  ? Acute combined systolic and diastolic congestive heart failure ([place])   ? Acute on chronic heart failure with preserved ejection fraction (HFpEF) ([place])   ? Acute respiratory failure with hypoxia ([place])   ? Aortic stenosis   ? moderate in 2022  ? Atrial fibrillation ([place])   ? CHF (congestive heart failure) ([place])   ? COPD exacerbation ([place]) 12/09/2020  ? Coronary artery disease   ? Demand ischemia ([place]) 03/26/2021  ? Diabetes mellitus without complication ([place])   ? HLD (hyperlipidemia)   ? Hypertension   ? Long term (current) use of anticoagulants 12/29/2019  ? Malnutrition of moderate degree 05/09/2021  ? Peripheral arterial disease ([place])   ? Pneumonia due to COVID-19 virus 05/29/2021  ? ? ?Medications:  ?(Not in a hospital admission)  ?Scheduled:  ? amiodarone  200 mg Oral Daily  ? atorvastatin  80 mg Oral Daily  ? clopidogrel  75 mg Oral Daily  ? dexamethasone (DECADRON) injection  6 mg Intravenous Daily  ? ferrous sulfate  325 mg Oral QODAY  ? fluticasone furoate-vilanterol  1 puff Inhalation Daily  ? And  ? umeclidinium bromide  1 puff Inhalation Daily  ? [START ON 07/21/2021] furosemide  40 mg Intravenous Daily  ?  insulin detemir  25 Units Subcutaneous Daily  ? pantoprazole  40 mg Oral BID  ? ?Infusions:  ? ceFEPime (MAXIPIME) IV Stopped (07/20/21 1243)  ? heparin    ? [START ON 07/21/2021] remdesivir 100 mg in NS 100 mL    ? [START ON 07/21/2021] vancomycin    ? ?PRN: albuterol, ipratropium-albuterol ? ?Assessment: ?38 yom with a history of CAD, aortic stenosis, PAF, hypertension, hyperlipidemia, peripheral arterial disease s/p L BKA, chronic systolic heart failure with improved LVEF, diabetes mellitus and hx of DVT . Patient is presenting with chest pain. Heparin per pharmacy consult placed for atrial fibrillation. ? ?Patient is not on anticoagulation prior to arrival. ? ?Hgb 8.8 near BL; plt 302 ? ?Goal of Therapy:  ?Heparin level 0.3-0.7 units/ml ?Monitor platelets by anticoagulation protocol: Yes ?  ?Plan:  ?No initial bolus ?Start heparin infusion at 1500 units/hr ?Check anti-Xa level in 8 hours and daily while on heparin ?Continue to monitor H&H and platelets ? ?Lorelei Pont, PharmD, BCPS ?07/20/2021 2:32 PM ?ED Clinical Pharmacist -  [phone] ? ?  ?

## 2021-07-20 NOTE — ED Provider Notes (Signed)
[name] ? ? ?CSN: [id number] ?Arrival date & time: 07/20/21  0424 ? ?  ? ?History ? ?Chief Complaint  ?Patient presents with  ? Respiratory Distress  ? ? ?Rodney [name] is a 71 y.o. male. ? ?71 yo M with a chief complaints of shortness of breath.  This been going on for about a week.  Patient has been coughing quite a bit.  Has tried his home COPD medications without significant improvement.  He denies fevers.  Denies chest pain or pressure.  Per EMS the patient was very short of breath on arrival.  Placed on CPAP.  Had improvement of respiratory effort.  Given Solu-Medrol and a neb also with improvement. ? ? ? ?  ? ?Home Medications ?Prior to Admission medications   ?Medication Sig Start Date End Date Taking? Authorizing Provider  ?albuterol (VENTOLIN HFA) 108 (90 Base) MCG/ACT inhaler INHALE 2 PUFFS INTO THE LUNGS EVERY 6 (SIX) HOURS AS NEEDED FOR WHEEZING OR SHORTNESS OF BREATH. 04/24/21   [name], MD  ?amiodarone (PACERONE) 200 MG tablet Take 1 tablet (200 mg total) by mouth daily. 10/14/20   [name], MD  ?atorvastatin (LIPITOR) 80 MG tablet TAKE 1 TABLET EVERY DAY ?Patient taking differently: Take 80 mg by mouth daily. 03/11/21   O'NealCassie Freer, MD  ?Blood Glucose Monitoring Suppl (TRUE METRIX METER) DEVI Use to test blood sugar three times daily. ?Patient not taking: Reported on 05/07/2021 11/20/19   [name], MD  ?Blood Glucose Monitoring Suppl (TRUE METRIX METER) w/Device KIT USE AS DIRECTED ?Patient not taking: Reported on 05/07/2021 03/25/20   [name], MD  ?carvedilol (COREG) 12.5 MG tablet Take 6.25 mg by mouth 2 (two) times daily with a meal. 12/18/20   [provider]  ?clopidogrel (PLAVIX) 75 MG tablet TAKE 1 TABLET EVERY DAY WITH BREAKFAST ?Patient taking differently: Take 75 mg by mouth daily. 03/17/21   [name], MD  ?ferrous sulfate 325 (65 FE) MG tablet Take 1 tablet (325 mg  total) by mouth every other day. ?Patient taking differently: Take 325 mg by mouth daily with breakfast. 05/18/21   Shary Key, DO  ?fluticasone (FLONASE) 50 MCG/ACT nasal spray Place 2 sprays into both nostrils daily. 02/13/21   Simmons-Robinson, Makiera, MD  ?Fluticasone-Umeclidin-Vilant (TRELEGY ELLIPTA) 100-62.5-25 MCG/ACT AEPB Inhale 1 puff into the lungs daily. 06/01/21   Eppie Gibson, MD  ?furosemide (LASIX) 40 MG tablet Take 1 tablet (40 mg total) by mouth daily as needed for fluid (swelling). ?Patient taking differently: Take 40 mg by mouth daily. 02/13/21   Simmons-Robinson, Riki Sheer, MD  ?gabapentin (NEURONTIN) 300 MG capsule Take 600 mg by mouth at bedtime.    [provider]  ?glucose blood (RELION TRUE METRIX TEST STRIPS) test strip Use to test blood sugar three times per day. ?Patient not taking: Reported on 05/07/2021 11/20/19   [name], MD  ?insulin detemir (LEVEMIR) 100 UNIT/ML FlexPen Inject 25 Units into the skin daily. 07/17/21   [name], MD  ?Insulin Pen Needle 32G X 4 MM MISC Use to inject insulin up to 4 times daily as directed, 05/16/21     ?ipratropium-albuterol (DUONEB) 0.5-2.5 (3) MG/3ML SOLN Take 3 mLs by nebulization every 4 (four) hours as needed. 05/19/21   [name], MD  ?lactose free nutrition (BOOST) LIQD Take 237 mLs by mouth 3 (three) times a week.    [provider]  ?metFORMIN (  GLUCOPHAGE) 1000 MG tablet Take 1 tablet (1,000 mg total) by mouth daily. 04/07/21   Lavina Hamman, MD  ?nicotine (NICODERM CQ - DOSED IN MG/24 HR) 7 mg/24hr patch Place 1 patch (7 mg total) onto the skin daily. 07/01/21   Shary Key, DO  ?pantoprazole (PROTONIX) 40 MG tablet Take 1 tablet (40 mg total) by mouth 2 (two) times daily. 05/16/21 06/20/21  Shary Key, DO  ?traZODone (DESYREL) 50 MG tablet Take 0.5-1 tablets (25-50 mg total) by mouth at bedtime as needed for sleep. 05/19/21   [name], MD  ?TRUEplus Lancets 33G MISC Use to  test blood sugar three times per day. ?Patient not taking: Reported on 05/07/2021 11/20/19   [name], MD  ?   ? ?Allergies    ?Patient has no known allergies.   ? ?Review of Systems   ?Review of Systems ? ?Physical Exam ?Updated Vital Signs ?BP 93/62   Pulse 76   Temp (!) 101.9 ?F (38.8 ?C) (Axillary)   Resp 15   Ht 6' (1.829 m)   Wt 100.7 kg   SpO2 98%   BMI 30.11 kg/m?  ?Physical Exam ?Vitals and nursing note reviewed.  ?Constitutional:   ?   Appearance: He is well-developed.  ?HENT:  ?   Head: Normocephalic and atraumatic.  ?Eyes:  ?   Pupils: Pupils are equal, round, and reactive to light.  ?Neck:  ?   Vascular: No JVD.  ?Cardiovascular:  ?   Rate and Rhythm: Normal rate and regular rhythm.  ?   Heart sounds: No murmur heard. ?  No friction rub. No gallop.  ?Pulmonary:  ?   Effort: No respiratory distress.  ?   Breath sounds: No wheezing.  ?   Comments: Diffuse wheezes with prolonged expiratory effort ?Abdominal:  ?   General: There is no distension.  ?   Tenderness: There is no abdominal tenderness. There is no guarding or rebound.  ?Musculoskeletal:     ?   General: Normal range of motion.  ?   Cervical back: Normal range of motion and neck supple.  ?Skin: ?   Coloration: Skin is not pale.  ?   Findings: No rash.  ?Neurological:  ?   Mental Status: He is alert and oriented to person, place, and time.  ?Psychiatric:     ?   Behavior: Behavior normal.  ? ? ?ED Results / Procedures / Treatments   ?Labs ?(all labs ordered are listed, but only abnormal results are displayed) ?Labs Reviewed  ?CBC WITH DIFFERENTIAL/PLATELET - Abnormal; Notable for the following components:  ?    Result Value  ? RBC 3.34 (*)   ? Hemoglobin 9.8 (*)   ? HCT 32.4 (*)   ? RDW 19.2 (*)   ? All other components within normal limits  ?BASIC METABOLIC PANEL - Abnormal; Notable for the following components:  ? Glucose, Bld 177 (*)   ? BUN 31 (*)   ? Creatinine, Ser 1.72 (*)   ? Calcium 8.4 (*)   ? GFR, Estimated 42 (*)   ? All  other components within normal limits  ?BRAIN NATRIURETIC PEPTIDE - Abnormal; Notable for the following components:  ? B Natriuretic Peptide 1,079.7 (*)   ? All other components within normal limits  ?I-STAT VENOUS BLOOD GAS, ED - Abnormal; Notable for the following components:  ? pCO2, Ven 60.9 (*)   ? pO2, Ven 58 (*)   ? Bicarbonate 33.8 (*)   ?  TCO2 36 (*)   ? Acid-Base Excess 7.0 (*)   ? Calcium, Ion 1.13 (*)   ? HCT 32.0 (*)   ? Hemoglobin 10.9 (*)   ? All other components within normal limits  ?TROPONIN I (HIGH SENSITIVITY) - Abnormal; Notable for the following components:  ? Troponin I (High Sensitivity) 57 (*)   ? All other components within normal limits  ?RESP PANEL BY RT-PCR (FLU A&B, COVID) [id number]  ?CULTURE, BLOOD (ROUTINE X 2)  ?CULTURE, BLOOD (ROUTINE X 2)  ?LACTIC ACID, PLASMA  ?LACTIC ACID, PLASMA  ? ? ?EKG ?None ? ?Radiology ?DG Chest Port 1 View ? ?Result Date: 07/20/2021 ?CLINICAL DATA:  71 year old male with history of respiratory distress and shortness of breath. EXAM: PORTABLE CHEST 1 VIEW COMPARISON:  Chest x-ray 06/23/2021. FINDINGS: Lung volumes are low. Ill-defined opacities and areas of interstitial prominence are noted throughout the lungs bilaterally, most severe in the mid to lower lungs (left-greater-than-right). Moderate left pleural effusion, some of which is likely partially loculated within the left major fissure. Small right pleural effusion. No pneumothorax. Pulmonary vasculature and cardiac silhouette are obscured. Atherosclerotic calcifications in the thoracic aorta. IMPRESSION: 1. The appearance the chest is concerning for severe multilobar bilateral bronchopneumonia. 2. Chronic moderate left pleural effusion partially loculated in the left major fissure and small right pleural effusion. 3. Aortic atherosclerosis. Electronically Signed   By: Vinnie Langton M.D.   On: 07/20/2021 05:03   ? ?Procedures ?Procedures  ? ? ?Medications Ordered in ED ?Medications  ?acetaminophen  (TYLENOL) tablet 1,000 mg (has no administration in time range)  ?vancomycin (VANCOREADY) IVPB 2000 mg/400 mL (has no administration in time range)  ?piperacillin-tazobactam (ZOSYN) IVPB 3.375 g (has no administration i

## 2021-07-20 NOTE — Plan of Care (Signed)
°  Problem: Nutrition: °Goal: Adequate nutrition will be maintained °Outcome: Progressing °  °Problem: Coping: °Goal: Level of anxiety will decrease °Outcome: Progressing °  °Problem: Safety: °Goal: Ability to remain free from injury will improve °Outcome: Progressing °  °

## 2021-07-21 DIAGNOSIS — I5033 Acute on chronic diastolic (congestive) heart failure: Secondary | ICD-10-CM | POA: Diagnosis not present

## 2021-07-21 DIAGNOSIS — J9601 Acute respiratory failure with hypoxia: Secondary | ICD-10-CM | POA: Diagnosis not present

## 2021-07-21 DIAGNOSIS — I350 Nonrheumatic aortic (valve) stenosis: Secondary | ICD-10-CM | POA: Diagnosis not present

## 2021-07-21 DIAGNOSIS — J189 Pneumonia, unspecified organism: Secondary | ICD-10-CM | POA: Diagnosis not present

## 2021-07-21 LAB — COMPREHENSIVE METABOLIC PANEL
ALT: 30 U/L (ref 0–44)
AST: 15 U/L (ref 15–41)
Albumin: 2.1 g/dL — ABNORMAL LOW (ref 3.5–5.0)
Alkaline Phosphatase: 71 U/L (ref 38–126)
Anion gap: 5 (ref 5–15)
BUN: 38 mg/dL — ABNORMAL HIGH (ref 8–23)
CO2: 29 mmol/L (ref 22–32)
Calcium: 8.2 mg/dL — ABNORMAL LOW (ref 8.9–10.3)
Chloride: 103 mmol/L (ref 98–111)
Creatinine, Ser: 1.62 mg/dL — ABNORMAL HIGH (ref 0.61–1.24)
GFR, Estimated: 46 mL/min — ABNORMAL LOW (ref >60)
Glucose, Bld: 208 mg/dL — ABNORMAL HIGH (ref 70–99)
Potassium: 4.3 mmol/L (ref 3.5–5.1)
Sodium: 137 mmol/L (ref 135–145)
Total Bilirubin: 1 mg/dL (ref 0.3–1.2)
Total Protein: 5.5 g/dL — ABNORMAL LOW (ref 6.5–8.1)

## 2021-07-21 LAB — CBC
HCT: 27.5 % — ABNORMAL LOW (ref 39.0–52.0)
Hemoglobin: 8.5 g/dL — ABNORMAL LOW (ref 13.0–17.0)
MCH: 29.1 pg (ref 26.0–34.0)
MCHC: 30.9 g/dL (ref 30.0–36.0)
MCV: 94.2 fL (ref 80.0–100.0)
Platelets: 221 10*3/uL (ref 150–400)
RBC: 2.92 MIL/uL — ABNORMAL LOW (ref 4.22–5.81)
RDW: 18.7 % — ABNORMAL HIGH (ref 11.5–15.5)
WBC: 5.3 10*3/uL (ref 4.0–10.5)
nRBC: 0 % (ref 0.0–0.2)

## 2021-07-21 LAB — HEPARIN LEVEL (UNFRACTIONATED)
Heparin Unfractionated: 0.28 IU/mL — ABNORMAL LOW (ref 0.30–0.70)
Heparin Unfractionated: 0.45 IU/mL (ref 0.30–0.70)
Heparin Unfractionated: 0.45 IU/mL (ref 0.30–0.70)

## 2021-07-21 MED ORDER — MOLNUPIRAVIR EUA 200MG CAPSULE
4.0000 | ORAL_CAPSULE | Freq: Two times a day (BID) | ORAL | Status: AC
  Administered 2021-07-21 – 2021-07-25 (×10): 800 mg via ORAL
  Filled 2021-07-21: qty 4

## 2021-07-21 NOTE — Consult Note (Addendum)
WOC Nurse Consult Note: ?Patient receiving care in [place] ?Currently COVID +. Patient is known to the [facility] team from previous admission in February ?Consult completed remotely after review of chart and images taken yesterday by Mickie Kay MD.  ?Reason for Consult: RLE wound ?Wound type: Arterial insufficiency with previous  Resting right ankle-brachial index indicating non-compressible right lower extremity arteries. The RLE wound has improved since 06/20/21 photo. ?Pressure Injury POA: NA ?Wound bed: Blackened tip of the second toe with dry flaky skin surrounding the toes and the anterior foot and LE  ?Dressing procedure/placement/frequency: ?Clean the RLE with soap and water, rinse and pat dry. Apply Sween moisturizing lotion on the lower leg and foot then apply betadine (iodine swab) over the tip of the second toe and allow to air dry. May wrap the RLE with Kerlix for protection. Change and repeat process daily.  ? ?Monitor the wound area(s) for worsening of condition such as: ?Signs/symptoms of infection, increase in size, development of or worsening of odor, ?development of pain, or increased pain at the affected locations.   ?Notify the medical team if any of these develop. ? ?Thank you for the consult. [facility] nurse will not follow at this time.   ?Please re-consult the [facility] team if needed. ? ?Cathlean Marseilles. Tamala Julian, MSN, RN, CMSRN, AGCNS, WTA ?Wound Treatment Associate ?Pager [phone]    ? ? ?  ?

## 2021-07-21 NOTE — Progress Notes (Signed)
? ?Progress Note ? ?Patient Name: Rodney [name] ?Date of Encounter: 07/21/2021 ? ?[facility] Cardiologist: Evalina Field, MD  ? ?Subjective  ? ?I/os not recorded.  BP 125/68.   SPO2 99% on 2 L.  Creatinine mildly improved (1.72 > 1.62).  Reports dyspnea improved. ? ?Inpatient Medications  ?  ?Scheduled Meds: ? amiodarone  200 mg Oral Daily  ? atorvastatin  80 mg Oral Daily  ? clopidogrel  75 mg Oral Daily  ? dexamethasone (DECADRON) injection  6 mg Intravenous Daily  ? ferrous sulfate  325 mg Oral QODAY  ? fluticasone furoate-vilanterol  1 puff Inhalation Daily  ? And  ? umeclidinium bromide  1 puff Inhalation Daily  ? furosemide  40 mg Intravenous Daily  ? insulin detemir  25 Units Subcutaneous Daily  ? pantoprazole  40 mg Oral BID  ? ?Continuous Infusions: ? ceFEPime (MAXIPIME) IV 2 g (07/20/21 2131)  ? heparin 1,650 Units/hr (07/21/21 0906)  ? remdesivir 100 mg in NS 100 mL 100 mg (07/21/21 1118)  ? vancomycin    ? ?PRN Meds: ?albuterol, ipratropium-albuterol  ? ?Vital Signs  ?  ?Vitals:  ? 07/20/21 1803 07/20/21 2100 07/21/21 0449 07/21/21 0739  ?BP: 126/65 118/71 115/71 125/68  ?Pulse: 68 71 72   ?Resp: '13 14 20   '$ ?Temp: (!) 97.4 ?F (36.3 ?C) (!) 97.5 ?F (36.4 ?C) 97.9 ?F (36.6 ?C) 97.6 ?F (36.4 ?C)  ?TempSrc: Oral Oral Oral Oral  ?SpO2: 96% 97% 99%   ?Weight:      ?Height:      ? ? ?Intake/Output Summary (Last 24 hours) at 07/21/2021 1121 ?Last data filed at 07/21/2021 0700 ?Gross per 24 hour  ?Intake 222.71 ml  ?Output --  ?Net 222.71 ml  ? ? ?  07/20/2021  ?  4:35 AM 07/17/2021  ?  9:33 AM 06/26/2021  ?  5:00 AM  ?Last 3 Weights  ?Weight (lbs) 222 lb 0.1 oz 222 lb 270 lb 4.5 oz  ?Weight (kg) 100.7 kg 100.699 kg 122.6 kg  ?   ? ?Telemetry  ?  ?AFL 3:1 conduction, rate 70s - Personally Reviewed ? ?ECG  ?  ?Now new ECG - Personally Reviewed ? ?Physical Exam  ? ?GEN: No acute distress.   ?Neck: +JVD ?Cardiac: RRR, 3/6 systolic murmur ?Respiratory: Diminished breath sounds ?GI: Soft, nontender ?MS: 2+ RLE  edema, s/p L BKA ?Neuro:  Nonfocal  ?Psych: Normal affect  ? ?Labs  ?  ?High Sensitivity Troponin:   ?Recent Labs  ?Lab 07/20/21 ?3212 07/20/21 ?2482  ?TROPONINIHS 57* 75*  ?   ?Chemistry ?Recent Labs  ?Lab 07/20/21 ?0435 07/20/21 ?5003 07/20/21 ?1236 07/21/21 ?0215  ?NA 137 138 139 137  ?K 5.0 5.0 4.5 4.3  ?CL 102  --   --  103  ?CO2 29  --   --  29  ?GLUCOSE 177*  --   --  208*  ?BUN 31*  --   --  38*  ?CREATININE 1.72*  --   --  1.62*  ?CALCIUM 8.4*  --   --  8.2*  ?PROT  --   --   --  5.5*  ?ALBUMIN  --   --   --  2.1*  ?AST  --   --   --  15  ?ALT  --   --   --  30  ?ALKPHOS  --   --   --  71  ?BILITOT  --   --   --  1.0  ?GFRNONAA 42*  --   --  46*  ?ANIONGAP 6  --   --  5  ?  ?Lipids No results for input(s): CHOL, TRIG, HDL, LABVLDL, LDLCALC, CHOLHDL in the last 168 hours.  ?Hematology ?Recent Labs  ?Lab 07/20/21 ?0435 07/20/21 ?1245 07/20/21 ?1236 07/21/21 ?0215  ?WBC 8.8  --   --  5.3  ?RBC 3.34*  --   --  2.92*  ?HGB 9.8* 10.9* 8.8* 8.5*  ?HCT 32.4* 32.0* 26.0* 27.5*  ?MCV 97.0  --   --  94.2  ?MCH 29.3  --   --  29.1  ?MCHC 30.2  --   --  30.9  ?RDW 19.2*  --   --  18.7*  ?PLT 302  --   --  221  ? ?Thyroid No results for input(s): TSH, FREET4 in the last 168 hours.  ?BNP ?Recent Labs  ?Lab 07/20/21 ?8099  ?BNP 1,079.7*  ?  ?DDimer No results for input(s): DDIMER in the last 168 hours.  ? ?Radiology  ?  ?DG Chest Port 1 View ? ?Result Date: 07/20/2021 ?CLINICAL DATA:  71 year old male with history of respiratory distress and shortness of breath. EXAM: PORTABLE CHEST 1 VIEW COMPARISON:  Chest x-ray 06/23/2021. FINDINGS: Lung volumes are low. Ill-defined opacities and areas of interstitial prominence are noted throughout the lungs bilaterally, most severe in the mid to lower lungs (left-greater-than-right). Moderate left pleural effusion, some of which is likely partially loculated within the left major fissure. Small right pleural effusion. No pneumothorax. Pulmonary vasculature and cardiac silhouette are  obscured. Atherosclerotic calcifications in the thoracic aorta. IMPRESSION: 1. The appearance the chest is concerning for severe multilobar bilateral bronchopneumonia. 2. Chronic moderate left pleural effusion partially loculated in the left major fissure and small right pleural effusion. 3. Aortic atherosclerosis. Electronically Signed   By: Vinnie Langton M.D.   On: 07/20/2021 05:03   ? ?Cardiac Studies  ? ?Echo 06/23/21: ? 1. Left ventricular ejection fraction, by estimation, is 55 to 60%. The  ?left ventricle has normal function. The left ventricle has no regional  ?wall motion abnormalities. There is mild concentric left ventricular  ?hypertrophy. Left ventricular diastolic  ?parameters are indeterminate.  ? 2. Right ventricular systolic function is normal. The right ventricular  ?size is normal. There is mildly elevated pulmonary artery systolic  ?pressure.  ? 3. Left atrial size was mildly dilated.  ? 4. Right atrial size was mildly dilated.  ? 5. The mitral valve is grossly normal. Mild mitral valve regurgitation.  ?Moderate mitral annular calcification.  ? 6. The aortic valve is calcified. There is severe calcifcation of the  ?aortic valve. There is severe thickening of the aortic valve. Aortic valve  ?regurgitation is trivial. Severe aortic valve stenosis. Aortic valve area,  ?by VTI measures 0.75 cm?Marland Kitchen Aortic  ?valve mean gradient measures 43.0 mmHg. Aortic valve Vmax measures 4.30  ?m/s. DI 0.22  ? 7. Aortic dilatation noted. There is borderline dilatation of the aortic  ?root, measuring 36 mm. There is mild dilatation of the ascending aorta,  ?measuring 38 mm.  ? 8. The inferior vena cava is dilated in size with <50% respiratory  ?variability, suggesting right atrial pressure of 15 mmHg.  ? 9. A small pericardial effusion is present. The pericardial effusion is  ?posterior and lateral to the left ventricle.  ? ?Patient Profile  ?   ?71 y.o. male  with a hx of CAD, aortic stenosis, PAF, hypertension,  hyperlipidemia, peripheral arterial disease  s/p L BKA, chronic systolic heart failure with improved LVEF, diabetes mellitus and hx of DVT who is being seen 07/20/2021 for the evaluation of chest pain ? ?Assessment & Plan  ?  ?Acute on chronic diastolic heart failure ?-BNP 1079.  Chest x-ray with bilateral multilobular bronchopneumonia and left pleural effusion. ?-Volume overloaded on exam, continue IV lasix ?-Strict INO and daily weights ?  ?2.  Severe aortic stenosis ?-Most recent echo with mean gradient of 43 mmHg.  He will need TAVR work-up when recovers from [place] and stable from peripheral vascular perspective. ?-Difficult situation. ?  ?3.  CAD ?-Last cath was in 2021. ?-Thankfully no chest pain ?-On Plavix ?  ?4.  Paroxysmal atrial fibrillation/ now atrial flutter  ?-Patient was on Coumadin when last seen by cardiology service 10/2020.  Currently not on anticoagulation.  ?-Patient reported he is not taking amiodarone.   ?-EKG showed 2-1 atrial flutter.  He was clearly in sinus rhythm on last admission.  We will go ahead and start IV heparin.  Restart amiodarone 200 mg daily. ?  ?5. Hx of DVT in April 2022 ?- unclear if this was a coumadin failure or result of interruption in [facility] for PAD intervention ?  ?6. PAD s/p L BKA ?-  Recommended amputation of his gangrenous second toe 05/2021 during admission but  declined. ?  ?7.  Acute hypoxic respiratory failure secondary to COVID-19 pneumonia ?-Per primary team ? ? ?For questions or updates, please contact [place] ?Please consult [email or web address] for contact info under  ? ?  ?   ?Signed, ?Donato Heinz, MD  ?07/21/2021, 11:21 AM   ? ?

## 2021-07-21 NOTE — Progress Notes (Addendum)
ANTICOAGULATION CONSULT NOTE  ? ?Pharmacy Consult for Heparin ?Indication: atrial fibrillation ? ?No Known Allergies ? ?Patient Measurements: ?Height: 6' (182.9 cm) ?Weight: 100.7 kg (222 lb 0.1 oz) ?IBW/kg (Calculated) : 77.6 ?Heparin Dosing Weight: 98.1 kg ? ?Vital Signs: ?Temp: 97.6 ?F (36.4 ?C) (03/27 0739) ?Temp Source: Oral (03/27 0739) ?BP: 125/68 (03/27 0739) ?Pulse Rate: 72 (03/27 0449) ? ?Labs: ?Recent Labs  ?  07/20/21 ?0435 07/20/21 ?3151 07/20/21 ?0749 07/20/21 ?1236 07/20/21 ?1526 07/20/21 ?2149 07/21/21 ?0215  ?HGB 9.8* 10.9*  --  8.8*  --   --  8.5*  ?HCT 32.4* 32.0*  --  26.0*  --   --  27.5*  ?PLT 302  --   --   --   --   --  221  ?LABPROT  --   --   --   --  15.3*  --   --   ?INR  --   --   --   --  1.2  --   --   ?HEPARINUNFRC  --   --   --   --   --  0.25* 0.28*  ?CREATININE 1.72*  --   --   --   --   --  1.62*  ?TROPONINIHS 57*  --  75*  --   --   --   --   ? ? ? ?Estimated Creatinine Clearance: 52.8 mL/min (A) (by C-G formula based on SCr of 1.62 mg/dL (H)). ? ? ?Assessment: ?64 yom with a history of CAD, aortic stenosis, PAF, hypertension, hyperlipidemia, peripheral arterial disease s/p L BKA, chronic systolic heart failure with improved LVEF, diabetes mellitus and hx of DVT . Patient is presenting with chest pain. Heparin per pharmacy consult placed for atrial fibrillation. ? ?Patient is not on anticoagulation prior to arrival. ? ?Heparin level appears subtherapeutic at 0.28 on 1650 units/hr however this level was draw ~1.5 hours after rate change and is not a steady-state. No bleeding noted, Hgb low stable 8s, platelets are normal. ? ?Goal of Therapy:  ?Heparin level 0.3-0.7 units/ml ?Monitor platelets by anticoagulation protocol: Yes ?  ?Plan:  ?Continue heparin drip at 1650 units/hr ?Heparin level ~9:30 ?Daily heparin level, CBC ?Monitor for s/sx of bleeding ? ?Thank you for involving pharmacy in this patient's care. ? ?Renold Genta, PharmD, BCPS ?Clinical Pharmacist ?Clinical phone  for 07/21/2021 until 3p is [phone] ?07/21/2021 8:27 AM ? ?**Pharmacist phone directory can be found on [email or web address] listed under [place]** ? ?Addendum: ?Heparin level is therapeutic at 0.45 on 1650 units/hr. ? ?Confirmatory heparin level ~15:00 ? ?Renold Genta, PharmD, BCPS 12:34 PM ? ? ? ?  ?

## 2021-07-21 NOTE — Progress Notes (Signed)
Family Medicine Teaching Service ?Daily Progress Note ?Intern Pager: [phone] ? ?Patient name: Rodney [name] Medical record number: [id number] ?Date of birth: 03-28-1951 Age: 71 y.o. Gender: male ? ?Primary Care Provider: [name], MD ?Consultants: Cardiology ?Code Status: Full ? ?Pt Overview and Major Events to Date:  ?3/26 admitted ? ?Assessment and Plan: ?Rodney [name] is a 71 y.o. male presenting with shortness of breath x1 week. PMH is significant for type 2 diabetes, obesity, tobacco abuse, hypertension, protein calorie malnutrition, aortic stenosis, anemia of chronic disease, PVD, HFrEF, PAF, s/p BKA, CAD, CKD stage III, COPD ? ?Respiratory distress 2/2 PNA ?COPD ?HFpEF exacerbation ?COVID-19 positive ?Patient easily arousable and NAD on 2 L oxygen.  VSS.  Blood culture no growth 24 hours.  On vancomycin and cefepime.  CXR with severe multilobar bilateral bronchopneumonia.  BNP elevated at greater than 1000.  COVID-positive.  Only required 1 as needed dose of DuoNebs.  Home meds include DuoNeb 3 mL every 4 as needed, Trelegy Ellipta 1 puff daily, albuterol 2 puffs every 6 hours as needed.  Plan to de-escalate IV Vanc and cefepime after 48 hours no growth. Cautious use of abx given recent C. Diff but patient is at high risk for poor outcome given PNA, possible cellulitis, and multiple co-morbid risk factors. Switching form remdesivir to molnupiravir given mild-moderate symptoms.-Follow-up blood cultures ?-On 2 L [place] ?-A.m. CBC ?-A.m. BMP ?-Continuous pulse ox ?-Repeat CXR tomorrow ?-Abx: vanc and cefepime ?-Start 5 day course of molnupiravir for COVID-19 (Complete on 3/31) ?-Continue Decadron 6 mg 10 day course (complete on 4/4) ?-Continue IV Lasix 40 mg qd ?-Continue Incruse+Breo inhalers (Trelegy equivalent) ?-PRNs: Albuterol 2 puffs q6h PRN, DuoNeb q4h PRN ? ?HFpEF exacerbation ?Severe AS ?2/27 echo LVEF 55-60%, no regional wall abnormalities, mild concentric left ventricular hypertrophy,  severe calcification of aortic valve with severe thickening of the aortic valve with the aortic valve area measuring 0.75 cm?Marland Kitchen BNP on admission 1079. Per nurse, had 2L uop, net UOP 1.8 L then. Nurse unclear when last time canister was emptied. Appears to be good UOP so will continue with current dose of Lasix. Still volume overloaded. ?-Strict I/Os ?-Daily weights ?-Holding coreg due to lower blood pressures ?-IV Lasix 40 mg  ?-Cardiology following, appreciate recommendations and continued care ?-Will require TAVR workup per cardiology AFTER recovers  from [place] and peripheral vascular disease is stable ?-Will continue to hold coreg due to normotensive ? ?PAF/ now aflutter ?ECG showed 2-1 atrial flutter. Started on IV heparin. Restarted amiodarone 200 mg qd. ?-Continue IV heparin ?-Continue amiodarone 200 mg qd ? ?Elevated creatinine CKD stage III, improving ?Cr 1.72>1.62. Appears baseline 1.3-1.6. Anticipate further improvement with diuresis ?-AM BMP ?-Avoid nephrotoxic agents ? ?PVD  CAD  s/p L BKA ?Hgb 10.9>8.8>8.5. VSS. WOC consulted and have put in recs. They report foot looks better than last admission. ?-WOC consulted, placed recs, have s/o ?-Continue home plavix ?-Continue home atorvastatin ? ?Normocytic Anemia, likely 2/2 CKD ?10.9>8.8>8.5.  Patient takes ferrous sulfate 325 mg daily. ?-A.m. CBC ?-Continue home iron ? ?T2DM ?Glucose 177>208. Last A1c of 6.9 on 03/24/2021.  Home medications include gabapentin 600 mg at bedtime, Levemir 25 units daily, metformin 1000 mg daily ?- Sensitive SSI ?- CBG monitoring with meals and nightly ?- Continue home Levemir 25Units daily ?  ?GERD ?- Continue home Protonix 40 mg twice daily.   ?  ?Insomnia ?- Consider trazodone 25-50 mg at bedtime PRN for sleep if patient requires or switching to ramelteon ?  ?  Tobacco abuse ?Smokes about 4-5 cigarettes per day. ?- Consider nicotine patch if requested ? ? ?FEN/GI: Cardiac ?PPx: IV heparin ?Dispo:Pending PT recommendations  in  2-3 days. Barriers include ongoing medical treatment.  ? ?Subjective:  ?Patient seen and assessed at bedside.  Patient reports no acute complaints at this time.  Patient reports mild dyspnea still but feels that his breathing is much improved since yesterday.  Patient somewhat frustrated that he is back in the hospital again.  Patient reports plan to refuse PT/OT today as he did not feel up to it.  Encouraged patient to continue with mobilization in order to not decondition.  Patient understands this but just feels tired today.  All questions addressed at this time. ? ?Objective: ?Temp:  [94.6 ?F (34.8 ?C)-97.9 ?F (36.6 ?C)] 97.6 ?F (36.4 ?C) (03/27 0739) ?Pulse Rate:  [52-72] 72 (03/27 0449) ?Resp:  [12-25] 20 (03/27 0449) ?BP: (90-138)/(50-87) 125/68 (03/27 0739) ?SpO2:  [95 %-100 %] 99 % (03/27 0449) ?Physical Exam: ?General: Resting comfortably NAD ?Cardiovascular: RRR ?Respiratory: Diminished breath sounds, bilateral wheezing heard in all quadrants ?Abdomen: Soft, nontender ?Extremities: Left BKA, 2+ RLE edema ? ?Laboratory: ?Recent Labs  ?Lab 07/20/21 ?0435 07/20/21 ?7124 07/20/21 ?1236 07/21/21 ?0215  ?WBC 8.8  --   --  5.3  ?HGB 9.8* 10.9* 8.8* 8.5*  ?HCT 32.4* 32.0* 26.0* 27.5*  ?PLT 302  --   --  221  ? ?Recent Labs  ?Lab 07/20/21 ?0435 07/20/21 ?5809 07/20/21 ?1236 07/21/21 ?0215  ?NA 137 138 139 137  ?K 5.0 5.0 4.5 4.3  ?CL 102  --   --  103  ?CO2 29  --   --  29  ?BUN 31*  --   --  38*  ?CREATININE 1.72*  --   --  1.62*  ?CALCIUM 8.4*  --   --  8.2*  ?PROT  --   --   --  5.5*  ?BILITOT  --   --   --  1.0  ?ALKPHOS  --   --   --  49  ?ALT  --   --   --  30  ?AST  --   --   --  15  ?GLUCOSE 177*  --   --  208*  ? ? ? ? ?Imaging/Diagnostic Tests: ?No results found. ? ?France Ravens, MD ?07/21/2021, 7:49 AM ?PGY-1, [place] [address] ?[place] Intern pager: [phone], text pages welcome ?

## 2021-07-21 NOTE — Progress Notes (Signed)
ANTICOAGULATION CONSULT NOTE  ? ?Pharmacy Consult for Heparin ?Indication: atrial fibrillation, hx DVT ? ?No Known Allergies ? ?Patient Measurements: ?Height: 6' (182.9 cm) ?Weight: 100.7 kg (222 lb 0.1 oz) ?IBW/kg (Calculated) : 77.6 ?Heparin Dosing Weight: 98.1 kg ? ?Vital Signs: ?Temp: 97.4 ?F (36.3 ?C) (03/27 1537) ?Temp Source: Oral (03/27 1537) ?BP: 134/75 (03/27 1537) ?Pulse Rate: 72 (03/27 0449) ? ?Labs: ?Recent Labs  ?  07/20/21 ?0435 07/20/21 ?1194 07/20/21 ?0749 07/20/21 ?1236 07/20/21 ?1526 07/20/21 ?2149 07/21/21 ?0215 07/21/21 ?1740 07/21/21 ?1450  ?HGB 9.8* 10.9*  --  8.8*  --   --  8.5*  --   --   ?HCT 32.4* 32.0*  --  26.0*  --   --  27.5*  --   --   ?PLT 302  --   --   --   --   --  221  --   --   ?LABPROT  --   --   --   --  15.3*  --   --   --   --   ?INR  --   --   --   --  1.2  --   --   --   --   ?HEPARINUNFRC  --   --   --   --   --    < > 0.28* 0.45 0.45  ?CREATININE 1.72*  --   --   --   --   --  1.62*  --   --   ?TROPONINIHS 57*  --  75*  --   --   --   --   --   --   ? < > = values in this interval not displayed.  ? ? ? ?Estimated Creatinine Clearance: 52.8 mL/min (A) (by C-G formula based on SCr of 1.62 mg/dL (H)). ? ? ?Assessment: ?58 yom with a history of CAD, aortic stenosis, PAF, hypertension, hyperlipidemia, peripheral arterial disease s/p L BKA, chronic systolic heart failure with improved LVEF, diabetes mellitus and hx of DVT . Patient is presenting with chest pain. Heparin per pharmacy consult placed for atrial fibrillation. ? ?Patient is not on anticoagulation prior to arrival.  Patient had been on warfarin in the past but this was stopped at one time risk > benefit. ? ?Heparin level remains therapeutic on 1650 units/hr. No bleeding noted, Hgb low stable 8s, platelets are normal. ? ?Goal of Therapy:  ?Heparin level 0.3-0.7 units/ml ?Monitor platelets by anticoagulation protocol: Yes ?  ?Plan:  ?Continue heparin drip at 1650 units/hr ?Daily heparin level, CBC ?Monitor for s/sx of  bleeding ? ?Thank you for involving pharmacy in this patient's care. ? ?Horton Chin, PharmD, BCPS ?Clinical Pharmacist ?07/21/2021 4:41 PM ? ?**Pharmacist phone directory can be found on [email or web address] listed under [place]** ? ? ?  ?

## 2021-07-21 NOTE — Plan of Care (Signed)

## 2021-07-22 ENCOUNTER — Other Ambulatory Visit (HOSPITAL_COMMUNITY): Payer: Self-pay

## 2021-07-22 DIAGNOSIS — J189 Pneumonia, unspecified organism: Secondary | ICD-10-CM | POA: Diagnosis not present

## 2021-07-22 DIAGNOSIS — I5033 Acute on chronic diastolic (congestive) heart failure: Secondary | ICD-10-CM | POA: Diagnosis not present

## 2021-07-22 DIAGNOSIS — I350 Nonrheumatic aortic (valve) stenosis: Secondary | ICD-10-CM | POA: Diagnosis not present

## 2021-07-22 DIAGNOSIS — J9601 Acute respiratory failure with hypoxia: Secondary | ICD-10-CM | POA: Diagnosis not present

## 2021-07-22 DIAGNOSIS — I4892 Unspecified atrial flutter: Secondary | ICD-10-CM | POA: Diagnosis not present

## 2021-07-22 LAB — CBC
HCT: 26.9 % — ABNORMAL LOW (ref 39.0–52.0)
Hemoglobin: 8.2 g/dL — ABNORMAL LOW (ref 13.0–17.0)
MCH: 28.6 pg (ref 26.0–34.0)
MCHC: 30.5 g/dL (ref 30.0–36.0)
MCV: 93.7 fL (ref 80.0–100.0)
Platelets: 239 10*3/uL (ref 150–400)
RBC: 2.87 MIL/uL — ABNORMAL LOW (ref 4.22–5.81)
RDW: 18.8 % — ABNORMAL HIGH (ref 11.5–15.5)
WBC: 9.2 10*3/uL (ref 4.0–10.5)
nRBC: 0 % (ref 0.0–0.2)

## 2021-07-22 LAB — BASIC METABOLIC PANEL
Anion gap: 7 (ref 5–15)
BUN: 43 mg/dL — ABNORMAL HIGH (ref 8–23)
CO2: 30 mmol/L (ref 22–32)
Calcium: 8.3 mg/dL — ABNORMAL LOW (ref 8.9–10.3)
Chloride: 100 mmol/L (ref 98–111)
Creatinine, Ser: 1.49 mg/dL — ABNORMAL HIGH (ref 0.61–1.24)
GFR, Estimated: 50 mL/min — ABNORMAL LOW (ref >60)
Glucose, Bld: 176 mg/dL — ABNORMAL HIGH (ref 70–99)
Potassium: 4.4 mmol/L (ref 3.5–5.1)
Sodium: 137 mmol/L (ref 135–145)

## 2021-07-22 LAB — HEPARIN LEVEL (UNFRACTIONATED): Heparin Unfractionated: 0.55 IU/mL (ref 0.30–0.70)

## 2021-07-22 LAB — MRSA NEXT GEN BY PCR, NASAL: MRSA by PCR Next Gen: DETECTED — AB

## 2021-07-22 MED ORDER — METOPROLOL TARTRATE 12.5 MG HALF TABLET
12.5000 mg | ORAL_TABLET | Freq: Two times a day (BID) | ORAL | Status: DC
  Administered 2021-07-22 – 2021-07-23 (×3): 12.5 mg via ORAL
  Filled 2021-07-22 (×3): qty 1

## 2021-07-22 MED ORDER — METOPROLOL TARTRATE 25 MG PO TABS: 25.0000 mg | ORAL_TABLET | Freq: Two times a day (BID) | ORAL | Status: DC

## 2021-07-22 NOTE — Progress Notes (Signed)
FPTS Interim Progress Note ? ?S:Patient had just gotten comfortable to sleep. Rounded with primary night RN who voiced no concerns, she says he is putting out good UOP with diuresis- around 1.2L. He had large BM. ? ?O: ?BP (!) 143/78 (BP Location: Left Arm)   Pulse 73   Temp 98.2 ?F (36.8 ?C) (Oral)   Resp 18   Ht 6' (1.829 m)   Wt 100.7 kg   SpO2 97%   BMI 30.11 kg/m?   ?General: Resting in bed ?Resp: Normal chest rise and fall ? ?A/P: ?PNA vs. CHF; COVID-19 + ?On 2L [place]. Making good UOP with diuresis. VSS. ?- Continue to monitor O2 status ?- Continue with plans per day team ? ?Severe aortic stenosis ?Holding Coreg d/t normotensive. BP 110-140/70s. ? ?Labs and orders reviewed. No new orders. ? ?Rodney Brill, DO ?07/22/2021, 11:23 PM ?PGY-1, [place] ?Service pager [phone] ? ?

## 2021-07-22 NOTE — Progress Notes (Signed)
ANTICOAGULATION CONSULT NOTE  ? ?Pharmacy Consult for Heparin ?Indication: atrial fibrillation, hx DVT ? ?No Known Allergies ? ?Patient Measurements: ?Height: 6' (182.9 cm) ?Weight: 100.7 kg (222 lb 0.1 oz) ?IBW/kg (Calculated) : 77.6 ?Heparin Dosing Weight: 98.1 kg ? ?Vital Signs: ?Temp: 98 ?F (36.7 ?C) (03/28 0000) ?Temp Source: Oral (03/28 0000) ?BP: 115/66 (03/28 0325) ?Pulse Rate: 78 (03/28 0000) ? ?Labs: ?Recent Labs  ?  07/20/21 ?0435 07/20/21 ?8676 07/20/21 ?0749 07/20/21 ?1236 07/20/21 ?1526 07/20/21 ?2149 07/21/21 ?0215 07/21/21 ?1950 07/21/21 ?1450 07/22/21 ?0258  ?HGB 9.8*   < >  --  8.8*  --   --  8.5*  --   --  8.2*  ?HCT 32.4*   < >  --  26.0*  --   --  27.5*  --   --  26.9*  ?PLT 302  --   --   --   --   --  221  --   --  239  ?LABPROT  --   --   --   --  15.3*  --   --   --   --   --   ?INR  --   --   --   --  1.2  --   --   --   --   --   ?HEPARINUNFRC  --   --   --   --   --    < > 0.28* 0.45 0.45 0.55  ?CREATININE 1.72*  --   --   --   --   --  1.62*  --   --  1.49*  ?TROPONINIHS 57*  --  75*  --   --   --   --   --   --   --   ? < > = values in this interval not displayed.  ? ? ? ?Estimated Creatinine Clearance: 57.4 mL/min (A) (by C-G formula based on SCr of 1.49 mg/dL (H)). ? ? ?Assessment: ?71 yom with a history of CAD, aortic stenosis, PAF, hypertension, hyperlipidemia, peripheral arterial disease s/p L BKA, chronic systolic heart failure with improved LVEF, diabetes mellitus and hx of DVT . Patient is presenting with chest pain. Heparin per pharmacy consult placed for atrial fibrillation. ? ?Patient is not on anticoagulation prior to arrival.  Patient had been on warfarin in the past but this was stopped at one time risk > benefit. ? ?Heparin level remains therapeutic at 0.55 on 1650 units/hr. No bleeding noted, Hgb low stable 8s, platelets are normal. ? ?Goal of Therapy:  ?Heparin level 0.3-0.7 units/ml ?Monitor platelets by anticoagulation protocol: Yes ?  ?Plan:  ?Continue heparin drip  at 1650 units/hr ?Daily heparin level, CBC ?Monitor for s/sx of bleeding ?F/U GOC  ? ?Thank you for involving pharmacy in this patient's care. ? ?Adria Dill, PharmD ?PGY-1 Acute Care Resident  ?07/22/2021 8:20 AM  ? ? ? ?  ?

## 2021-07-22 NOTE — Progress Notes (Signed)
? ?Progress Note ? ?Patient Name: Rodney [name] ?Date of Encounter: 07/22/2021 ? ?[facility] Cardiologist: Evalina Field, MD  ? ?Subjective  ? ?Net -2.4 L yesterday.  BP 105/76.  Creatinine improving (1.72 > 1.62 > 1.49).  BP 105/76.  Dyspnea improved ? ?Inpatient Medications  ?  ?Scheduled Meds: ? amiodarone  200 mg Oral Daily  ? atorvastatin  80 mg Oral Daily  ? clopidogrel  75 mg Oral Daily  ? ferrous sulfate  325 mg Oral QODAY  ? fluticasone furoate-vilanterol  1 puff Inhalation Daily  ? And  ? umeclidinium bromide  1 puff Inhalation Daily  ? furosemide  40 mg Intravenous Daily  ? insulin detemir  25 Units Subcutaneous Daily  ? molnupiravir EUA  4 capsule Oral BID  ? pantoprazole  40 mg Oral BID  ? ?Continuous Infusions: ? heparin 1,650 Units/hr (07/22/21 1152)  ? ?PRN Meds: ?albuterol, ipratropium-albuterol  ? ?Vital Signs  ?  ?Vitals:  ? 07/22/21 0000 07/22/21 0325 07/22/21 5465 07/22/21 0853  ?BP: 116/70 115/66  105/76  ?Pulse: 78  78 82  ?Resp: 19  (!) 22 20  ?Temp: 98 ?F (36.7 ?C)   (!) 97.5 ?F (36.4 ?C)  ?TempSrc: Oral   Oral  ?SpO2: 92%  97% 96%  ?Weight:      ?Height:      ? ? ?Intake/Output Summary (Last 24 hours) at 07/22/2021 1256 ?Last data filed at 07/22/2021 0900 ?Gross per 24 hour  ?Intake 480 ml  ?Output 1700 ml  ?Net -1220 ml  ? ? ? ?  07/20/2021  ?  4:35 AM 07/17/2021  ?  9:33 AM 06/26/2021  ?  5:00 AM  ?Last 3 Weights  ?Weight (lbs) 222 lb 0.1 oz 222 lb 270 lb 4.5 oz  ?Weight (kg) 100.7 kg 100.699 kg 122.6 kg  ?   ? ?Telemetry  ?  ?AFL 3:1 conduction, rate 70s - Personally Reviewed ? ?ECG  ?  ?No new ECG - Personally Reviewed ? ?Physical Exam  ? ?GEN: No acute distress.   ?Neck: +JVD ?Cardiac: RRR, 3/6 systolic murmur ?Respiratory: Diminished breath sounds ?GI: Soft, nontender ?MS: 1+ RLE edema, s/p L BKA ?Neuro:  Nonfocal  ?Psych: Normal affect  ? ?Labs  ?  ?High Sensitivity Troponin:   ?Recent Labs  ?Lab 07/20/21 ?0354 07/20/21 ?6568  ?TROPONINIHS 57* 75*  ? ?   ?Chemistry ?Recent Labs   ?Lab 07/20/21 ?0435 07/20/21 ?1275 07/20/21 ?1236 07/21/21 ?0215 07/22/21 ?0258  ?NA 137   < > 139 137 137  ?K 5.0   < > 4.5 4.3 4.4  ?CL 102  --   --  103 100  ?CO2 29  --   --  29 30  ?GLUCOSE 177*  --   --  208* 176*  ?BUN 31*  --   --  38* 43*  ?CREATININE 1.72*  --   --  1.62* 1.49*  ?CALCIUM 8.4*  --   --  8.2* 8.3*  ?PROT  --   --   --  5.5*  --   ?ALBUMIN  --   --   --  2.1*  --   ?AST  --   --   --  15  --   ?ALT  --   --   --  30  --   ?ALKPHOS  --   --   --  30  --   ?BILITOT  --   --   --  1.0  --   ?  GFRNONAA 42*  --   --  46* 50*  ?ANIONGAP 6  --   --  5 7  ? < > = values in this interval not displayed.  ? ?  ?Lipids No results for input(s): CHOL, TRIG, HDL, LABVLDL, LDLCALC, CHOLHDL in the last 168 hours.  ?Hematology ?Recent Labs  ?Lab 07/20/21 ?0435 07/20/21 ?3532 07/20/21 ?1236 07/21/21 ?0215 07/22/21 ?0258  ?WBC 8.8  --   --  5.3 9.2  ?RBC 3.34*  --   --  2.92* 2.87*  ?HGB 9.8*   < > 8.8* 8.5* 8.2*  ?HCT 32.4*   < > 26.0* 27.5* 26.9*  ?MCV 97.0  --   --  94.2 93.7  ?MCH 29.3  --   --  29.1 28.6  ?MCHC 30.2  --   --  30.9 30.5  ?RDW 19.2*  --   --  18.7* 18.8*  ?PLT 302  --   --  221 239  ? < > = values in this interval not displayed.  ? ? ?Thyroid No results for input(s): TSH, FREET4 in the last 168 hours.  ?BNP ?Recent Labs  ?Lab 07/20/21 ?9924  ?BNP 1,079.7*  ? ?  ?DDimer No results for input(s): DDIMER in the last 168 hours.  ? ?Radiology  ?  ?No results found. ? ?Cardiac Studies  ? ?Echo 06/23/21: ? 1. Left ventricular ejection fraction, by estimation, is 55 to 60%. The  ?left ventricle has normal function. The left ventricle has no regional  ?wall motion abnormalities. There is mild concentric left ventricular  ?hypertrophy. Left ventricular diastolic  ?parameters are indeterminate.  ? 2. Right ventricular systolic function is normal. The right ventricular  ?size is normal. There is mildly elevated pulmonary artery systolic  ?pressure.  ? 3. Left atrial size was mildly dilated.  ? 4. Right  atrial size was mildly dilated.  ? 5. The mitral valve is grossly normal. Mild mitral valve regurgitation.  ?Moderate mitral annular calcification.  ? 6. The aortic valve is calcified. There is severe calcifcation of the  ?aortic valve. There is severe thickening of the aortic valve. Aortic valve  ?regurgitation is trivial. Severe aortic valve stenosis. Aortic valve area,  ?by VTI measures 0.75 cm?Marland Kitchen Aortic  ?valve mean gradient measures 43.0 mmHg. Aortic valve Vmax measures 4.30  ?m/s. DI 0.22  ? 7. Aortic dilatation noted. There is borderline dilatation of the aortic  ?root, measuring 36 mm. There is mild dilatation of the ascending aorta,  ?measuring 38 mm.  ? 8. The inferior vena cava is dilated in size with <50% respiratory  ?variability, suggesting right atrial pressure of 15 mmHg.  ? 9. A small pericardial effusion is present. The pericardial effusion is  ?posterior and lateral to the left ventricle.  ? ?Patient Profile  ?   ?71 y.o. male  with a hx of CAD, aortic stenosis, PAF, hypertension, hyperlipidemia, peripheral arterial disease s/p L BKA, chronic systolic heart failure with improved LVEF, diabetes mellitus and hx of DVT who is being seen 07/20/2021 for the evaluation of chest pain ? ?Assessment & Plan  ?  ?Acute on chronic diastolic heart failure ?-BNP 1079.  Chest x-ray with bilateral multilobular bronchopneumonia and left pleural effusion. ?-Volume overloaded on exam, continue IV lasix ?-Strict INO and daily weights ?  ?2.  Severe aortic stenosis ?-Most recent echo with mean gradient of 43 mmHg.  He will need TAVR work-up when recovers from [place] and stable from peripheral vascular perspective. ?-Difficult situation. ?  ?  3.  CAD ?-Last cath was in 2021.  No chest pain ?-On Plavix ?  ?4.  Paroxysmal atrial fibrillation/ now atrial flutter  ?-Patient was on Coumadin when last seen by cardiology service 10/2020 but had stopped taking ?-Currently in  3:1 atrial flutter.  He was in sinus rhythm on last  admission.  He was resumed on amiodarone and heparin.  Would favor stopping amiodarone given risk of chemical cardioversion in patient who has been off AC.  Start low dose metoprolol. Can transition to PO anticoagulation, ideally Eliquis if able to afford ?  ?5. Hx of DVT in April 2022 ?- unclear if this was a coumadin failure or result of interruption in [facility] for PAD intervention ?  ?6. PAD s/p L BKA ?-  Recommended amputation of his gangrenous second toe 05/2021 during admission but  declined. ?  ?7.  Acute hypoxic respiratory failure secondary to COVID-19 pneumonia ?-Per primary team ? ? ?For questions or updates, please contact [place] ?Please consult [email or web address] for contact info under  ? ?  ?   ?Signed, ?Donato Heinz, MD  ?07/22/2021, 12:56 PM   ? ?

## 2021-07-22 NOTE — Progress Notes (Addendum)
Family Medicine Teaching Service ?Daily Progress Note ?Intern Pager: [phone] ? ?Patient name: Rodney [name] Medical record number: [id number] ?Date of birth: 25-Apr-1951 Age: 71 y.o. Gender: male ? ?Primary Care Provider: [name], MD ?Consultants: Cardiology ?Code Status: Full ? ?Pt Overview and Major Events to Date:  ?3/26 admitted ? ?Assessment and Plan: ?TREVIN GARTRELL is a 71 y.o. male presenting with shortness of breath x1 week. PMH is significant for type 2 diabetes, obesity, tobacco abuse, hypertension, protein calorie malnutrition, aortic stenosis, anemia of chronic disease, PVD, HFrEF, PAF, s/p BKA, CAD, CKD stage III, COPD ?  ?Acute Hypoxic Respiratory distress 2/2 PNA vs CHF exacerbation ?COPD ?HFpEF exacerbation ?COVID-19 positive ?Resting comfortably on 2 L [place]. VSS. UOP ~3L for past 2 days. Net output 2.2 L.  Cr 1.62>1.49. Continue diuresis. Required 2x duoneb PRN yesterday. Blood culture no growth 48 hours.  ?-2 L [place] ?-AM CBC ?-AM BMP ?-Discontinue all abx ?-Continue molnupiravir for COVID-19 (finish on 3/31) ?-Discontinue Decadron given patient's respiratory symptoms mild ?-IV Lasix 40 mg ?-Continue Incruse+Breo inhalers (Trelegy equivalent) ?-PRNs: Albuterol 2 puffs q6h PRN, DuoNeb q4h PRN ?-PT/OT ? ?Severe AS ?-Cardiology following, appreciate recs ?-Will require TAVR workup after recovering from [place] and stable peripheral vascular disease ?-Continue to hold Coreg as is normotensive ? ?PAF/ now aflutter ?ECG showed 2-1 atrial flutter. Started on IV heparin. Restarted amiodarone 200 mg qd. ?-Continue IV heparin ?-Continue amiodarone 200 mg qd ? ?Elevated creatinine CKD stage III, improving ?Cr 1.62>1.49. Appears baseline 1.3-1.6. Anticipate further improvement with diuresis ?-AM BMP ?-Avoid nephrotoxic agents ? ?Normocytic Anemia, likely 2/2 CKD ?10.9>8.8>8.5>8.2.  Patient takes ferrous sulfate 325 mg daily. Continues to downtrend ?-A.m. CBC ?-Continue home iron ? ?T2DM,  stable ?Glucose stable. Last A1c of 6.9 on 03/24/2021.  Home medications include gabapentin 600 mg at bedtime, Levemir 25 units daily, metformin 1000 mg daily ?- Continue home Levemir 25Units daily ?- Holding metformin ?  ?GERD ?- Continue home Protonix 40 mg twice daily.   ?  ?Insomnia ?- Consider trazodone 25-50 mg at bedtime PRN for sleep if patient requires or switching to ramelteon ?  ?Tobacco abuse ?Smokes about 4-5 cigarettes per day. ?- Consider nicotine patch if requested ? ?FEN/GI: Cardiac ?PPx: IV heparin ?Dispo:Pending PT recommendations  in 2-3 days. Barriers include ongoing medical treatment.  ? ?Subjective:  ?Patient seen and assessed at bedside.  Patient reports no acute complaints at this time.  Patient reports dyspnea has since improved.  Patient only request was to have breakfast as he had not yet received it. ? ?Objective: ?Temp:  [97.4 ?F (36.3 ?C)-98.3 ?F (36.8 ?C)] 98 ?F (36.7 ?C) (03/28 0000) ?Pulse Rate:  [70-129] 78 (03/28 0000) ?Resp:  [18-20] 19 (03/28 0000) ?BP: (115-141)/(66-77) 115/66 (03/28 0325) ?SpO2:  [92 %-100 %] 92 % (03/28 0000) ?Physical Exam: ?General: Resting comfortably in bed. NAD. On 2 L [place] ?Cardiovascular: RRR ?Respiratory: Diminished breath sounds ?Abdomen: soft, nontender ?Extremities: L BKA. 2+ RLE edema ? ?Laboratory: ?Recent Labs  ?Lab 07/20/21 ?0435 07/20/21 ?5809 07/20/21 ?1236 07/21/21 ?0215 07/22/21 ?0258  ?WBC 8.8  --   --  5.3 9.2  ?HGB 9.8*   < > 8.8* 8.5* 8.2*  ?HCT 32.4*   < > 26.0* 27.5* 26.9*  ?PLT 302  --   --  221 239  ? < > = values in this interval not displayed.  ? ?Recent Labs  ?Lab 07/20/21 ?0435 07/20/21 ?9833 07/20/21 ?1236 07/21/21 ?0215 07/22/21 ?0258  ?NA 137   < >  139 137 137  ?K 5.0   < > 4.5 4.3 4.4  ?CL 102  --   --  103 100  ?CO2 29  --   --  29 30  ?BUN 31*  --   --  38* 43*  ?CREATININE 1.72*  --   --  1.62* 1.49*  ?CALCIUM 8.4*  --   --  8.2* 8.3*  ?PROT  --   --   --  5.5*  --   ?BILITOT  --   --   --  1.0  --   ?ALKPHOS  --   --   --   43  --   ?ALT  --   --   --  30  --   ?AST  --   --   --  15  --   ?GLUCOSE 177*  --   --  208* 176*  ? < > = values in this interval not displayed.  ? ? ? ?Imaging/Diagnostic Tests: ?No results found. ? ?France Ravens, MD ?07/22/2021, 7:46 AM ?PGY-1, [place] ?[place] Intern pager: [phone], text pages welcome ?

## 2021-07-22 NOTE — Progress Notes (Signed)
Patient stated he did not need it, he was good tonight. ?

## 2021-07-22 NOTE — Progress Notes (Signed)
FPTS Interim Progress Note ? ?S:Patient sleeping comfortably. Discussed with primary night RN- no concerns voiced. ? ?O: ?BP 116/70 (BP Location: Left Arm)   Pulse 78   Temp 98 ?F (36.7 ?C) (Oral)   Resp 19   Ht 6' (1.829 m)   Wt 100.7 kg   SpO2 92%   BMI 30.11 kg/m?   ?General: Sleeping ?Resp: No increased work of breathing, on 2L [place] ? ?A/P: ?Resp distress 2/2 PNA, COVID + ?On 2L [place] ?- Continue O2 support as needed ?- On Vanc and Cefepime; as well as Molnupiravir ?- Continue management per day team ? ?PAF ?In NSR ?- On IV Heparin ?- On Amiodraone ? ?Labs and orders reviewed. No new orders placed at this time. ? ?[name], DO ?07/22/2021, 12:43 AM ?PGY-1, [place] ?Service pager [phone] ? ?

## 2021-07-22 NOTE — Consult Note (Signed)
? ?  [facility] CM Inpatient Consult ? ? ?07/22/2021 ? ?Rodney [name] ?06-15-1950 ?[id number] ? ?Patient is on airborne precautions ? ?[place] [facility] [ACO] Patient: [name]  ? ?Primary Care Provider:  [name], MD, with [place] is an embedded provider with a Chronic Care Management team and program, and is listed for the transition of care follow up and appointments. ? ?Patient was screened for [name] practice service needs for chronic care management and active with [name] RN for CCM. ? ?Plan: Will continue to follow and will update [name] CCM team of any additional post hospital needs and for disposition.  ?Please contact for further questions, ? ?[name], RN BSN CCM ?[facility] Liaison ? [phone] business mobile phone ?Toll free office [phone]  ?Fax number: [phone] ?[email or web address] ? ? ? ?

## 2021-07-23 DIAGNOSIS — J189 Pneumonia, unspecified organism: Secondary | ICD-10-CM | POA: Diagnosis not present

## 2021-07-23 LAB — CBC
HCT: 28.7 % — ABNORMAL LOW (ref 39.0–52.0)
Hemoglobin: 9 g/dL — ABNORMAL LOW (ref 13.0–17.0)
MCH: 28.8 pg (ref 26.0–34.0)
MCHC: 31.4 g/dL (ref 30.0–36.0)
MCV: 92 fL (ref 80.0–100.0)
Platelets: 236 10*3/uL (ref 150–400)
RBC: 3.12 MIL/uL — ABNORMAL LOW (ref 4.22–5.81)
RDW: 18.8 % — ABNORMAL HIGH (ref 11.5–15.5)
WBC: 7.7 10*3/uL (ref 4.0–10.5)
nRBC: 0 % (ref 0.0–0.2)

## 2021-07-23 LAB — BASIC METABOLIC PANEL
Anion gap: 8 (ref 5–15)
BUN: 40 mg/dL — ABNORMAL HIGH (ref 8–23)
CO2: 30 mmol/L (ref 22–32)
Calcium: 8.6 mg/dL — ABNORMAL LOW (ref 8.9–10.3)
Chloride: 98 mmol/L (ref 98–111)
Creatinine, Ser: 1.29 mg/dL — ABNORMAL HIGH (ref 0.61–1.24)
GFR, Estimated: >60 mL/min (ref >60)
Glucose, Bld: 181 mg/dL — ABNORMAL HIGH (ref 70–99)
Potassium: 4.3 mmol/L (ref 3.5–5.1)
Sodium: 136 mmol/L (ref 135–145)

## 2021-07-23 LAB — HEPARIN LEVEL (UNFRACTIONATED): Heparin Unfractionated: 0.36 IU/mL (ref 0.30–0.70)

## 2021-07-23 MED ORDER — WARFARIN SODIUM 7.5 MG PO TABS
7.5000 mg | ORAL_TABLET | Freq: Once | ORAL | Status: AC
  Administered 2021-07-23: 7.5 mg via ORAL
  Filled 2021-07-23: qty 1

## 2021-07-23 MED ORDER — WARFARIN - PHARMACIST DOSING INPATIENT: Freq: Every day | Status: DC

## 2021-07-23 MED ORDER — CARVEDILOL 6.25 MG PO TABS
6.2500 mg | ORAL_TABLET | Freq: Two times a day (BID) | ORAL | Status: DC
  Administered 2021-07-23 – 2021-07-29 (×12): 6.25 mg via ORAL
  Filled 2021-07-23 (×12): qty 1

## 2021-07-23 MED ORDER — ENOXAPARIN SODIUM 150 MG/ML IJ SOSY
150.0000 mg | PREFILLED_SYRINGE | INTRAMUSCULAR | Status: DC
  Administered 2021-07-23 – 2021-07-27 (×5): 150 mg via SUBCUTANEOUS
  Filled 2021-07-23 (×7): qty 1

## 2021-07-23 NOTE — Plan of Care (Signed)

## 2021-07-23 NOTE — Progress Notes (Signed)
FPTS Brief Note ?Reviewed patient's vitals, recent notes.  ?Vitals:  ? 07/23/21 1550 07/23/21 2330  ?BP: (!) 152/77 138/74  ?Pulse: 74   ?Resp: 18   ?Temp:  98.3 ?F (36.8 ?C)  ?SpO2: 93%   ? ?At this time, no change in plan from day progress note.  ?Gifford Shave, MD ?Page [phone] with questions about this patient.  ? ? ?

## 2021-07-23 NOTE — Evaluation (Signed)
Physical Therapy Evaluation ?Patient Details ?Name: Rodney [name] ?MRN: [id number] ?DOB: 1951/03/25 ?Today's Date: 07/23/2021 ? ?History of Present Illness ? Pt is a 71 y/o M presenting to ED on 3/26 for respiratory distress secondary to PNA, COPD, and HFpEF exacerbation, COVID +.  PMH includes CAD, CKD, HTN, hypercholesteremai, PVD, obesity, DM2, and L BKA.  ?Clinical Impression ? Exam was limited due to agitation and pt unwilling to fully participate. Pt reported not feeling up to moving since he had COVID and did not want to mobilize. Pt was able to lift his legs against gravity and heel slide in the bed suggesting at least 3 out of 5 strength, full mmt was unable to be performed. Pt describes having impaired mobility at baseline needing help from his girlfriend with transfers and ADLs. During physical exam patient showed deficits in strength, endurance, activity tolerance. Based on limited exam, recommending therapy services at skilled nursing facility to address the previously stated deficits. Will continue to follow acutely to maximize functional mobility, independence and safety. ?   ?   ? ?Recommendations for follow up therapy are one component of a multi-disciplinary discharge planning process, led by the attending physician.  Recommendations may be updated based on patient status, additional functional criteria and insurance authorization. ? ?Follow Up Recommendations Skilled nursing-short term rehab (<3 hours/day) ? ?  ?Assistance Recommended at Discharge Frequent or constant Supervision/Assistance  ?Patient can return home with the following ? Help with stairs or ramp for entrance;Two people to help with walking and/or transfers;Assist for transportation;Two people to help with bathing/dressing/bathroom;Assistance with cooking/housework ? ?  ?Equipment Recommendations None recommended by PT  ?Recommendations for Other Services ?    ?  ?Functional Status Assessment Patient has had a recent decline in  their functional status and demonstrates the ability to make significant improvements in function in a reasonable and predictable amount of time.  ? ?  ?Precautions / Restrictions Precautions ?Precautions: Fall ?Required Braces or Orthoses: Other Brace ?Restrictions ?Weight Bearing Restrictions: No  ? ?  ? ?Mobility ? Bed Mobility ?  ?  ?  ?  ?  ?  ?  ?General bed mobility comments: Pt refused mobility but report that he was unable to roll over to his side when the doctors were checking his lungs. ?  ? ?Transfers ?  ?  ?  ?  ?  ?  ?  ?  ?  ?  ?  ? ?Ambulation/Gait ?  ?  ?  ?  ?  ?  ?  ?  ? ?Stairs ?  ?  ?  ?  ?  ? ?Wheelchair Mobility ?  ? ?Modified Rankin (Stroke Patients Only) ?  ? ?  ? ?Balance   ?  ?  ?  ?  ?  ?  ?  ?  ?  ?  ?  ?  ?  ?  ?  ?  ?  ?  ?   ? ? ? ?Pertinent Vitals/Pain Pain Assessment ?Pain Assessment: No/denies pain  ? ? ?Home Living Family/patient expects to be discharged to:: Private residence ?Living Arrangements: Spouse/significant other ?Available Help at Discharge: Family;Available 24 hours/day ?Type of Home: Mobile home ?Home Access: Ramped entrance ?  ?  ?  ?Home Layout: One level ?Home Equipment: Wheelchair - manual;Tub bench;BSC/3in1;Cane - single point;Rolling Walker (2 wheels);Hospital bed ?Additional Comments: pt confirms home set up  ?  ?Prior Function Prior Level of Function : Needs assist ?  ?  ?  ?  Physical Assist : Mobility (physical) ?Mobility (physical): Transfers ?  ?Mobility Comments: says girlfriend asssits with transfers, primarily uses w/c for mobility ?ADLs Comments: states girlfriend assists with bathing and LB dressing as needed ?  ? ? ?Hand Dominance  ? Dominant Hand: Right ? ?  ?Extremity/Trunk Assessment  ? Upper Extremity Assessment ?Upper Extremity Assessment: Defer to OT evaluation ?  ? ?Lower Extremity Assessment ?Lower Extremity Assessment: RLE deficits/detail;LLE deficits/detail ?RLE Deficits / Details: Pt was anble to lift his leg against gravity, heel slike in  the bed, and plantarflex and dorsiflex ankle. Exam limited by pt unwilling to participate. ?RLE Sensation: history of peripheral neuropathy ?LLE Deficits / Details: prior Lt BKA; able to lift against gravity. ?  ? ?   ?Communication  ? Communication: No difficulties  ?Cognition Arousal/Alertness: Awake/alert ?Behavior During Therapy: Agitated, Flat affect ?Overall Cognitive Status: Within Functional Limits for tasks assessed ?  ?  ?  ?  ?  ?  ?  ?  ?  ?  ?  ?  ?  ?  ?  ?  ?General Comments: Cognition not formally assessed but pt able to engage in appropriate conversation. Was able to discribe home set up and situations that he needs help with. ?  ?  ? ?  ?General Comments General comments (skin integrity, edema, etc.): balance was not performed, pt unwilling to participate. ? ?  ?Exercises    ? ?Assessment/Plan  ?  ?PT Assessment Patient needs continued PT services  ?PT Problem List Decreased strength;Decreased activity tolerance;Decreased balance;Decreased mobility;Cardiopulmonary status limiting activity;Obesity ? ?   ?  ?PT Treatment Interventions Functional mobility training;Therapeutic activities;Therapeutic exercise;Patient/family education;Wheelchair mobility training;DME instruction;Gait training;Balance training   ? ?PT Goals (Current goals can be found in the Care Plan section)  ?  ? ?  ?Frequency Min 3X/week ?  ? ? ?Co-evaluation   ?Reason for Co-Treatment: Complexity of the patient's impairments (multi-system involvement);Necessary to address cognition/behavior during functional activity ?  ?OT goals addressed during session: Strengthening/ROM ?  ? ? ?  ?AM-PAC PT "6 Clicks" Mobility  ?Outcome Measure Help needed turning from your back to your side while in a flat bed without using bedrails?: A Lot ?Help needed moving from lying on your back to sitting on the side of a flat bed without using bedrails?: Total ?Help needed moving to and from a bed to a chair (including a wheelchair)?: Total ?Help needed  standing up from a chair using your arms (e.g., wheelchair or bedside chair)?: Total ?Help needed to walk in hospital room?: Total ?Help needed climbing 3-5 steps with a railing? : Total ?6 Click Score: 7 ? ?  ?End of Session Equipment Utilized During Treatment: Oxygen ?Activity Tolerance: Treatment limited secondary to agitation ?Patient left: in bed;with call bell/phone within reach;with bed alarm set ?  ?PT Visit Diagnosis: Muscle weakness (generalized) (M62.81);Other abnormalities of gait and mobility (R26.89) ?  ? ?Time: 8127-5170 ?PT Time Calculation (min) (ACUTE ONLY): 13 min ? ? ?Charges:   PT Evaluation ?$PT Eval Moderate Complexity: 1 Mod ?  ?  ?   ? ? ?Quenton Fetter, SPT ? ? ?Quenton Fetter ?07/23/2021, 2:36 PM ? ?

## 2021-07-23 NOTE — Telephone Encounter (Signed)
Patient currently admitted  ? ?Rodney [name]  ?Care Guide, [name] Care Coordination ?[place]  Care Management  ?Direct Dial: [phone] ? ?

## 2021-07-23 NOTE — Progress Notes (Signed)
ANTICOAGULATION CONSULT NOTE  ? ?Pharmacy Consult for enoxaparin/warfarin  ?Indication: atrial fibrillation, hx DVT ? ?No Known Allergies ? ?Patient Measurements: ?Height: 6' (182.9 cm) ?Weight: 100.7 kg (222 lb 0.1 oz) ?IBW/kg (Calculated) : 77.6 ?Heparin Dosing Weight: 98.1 kg ? ?Vital Signs: ?Temp: 98.1 ?F (36.7 ?C) (03/29 6144) ?Temp Source: Oral (03/29 3154) ?BP: 148/84 (03/29 0806) ?Pulse Rate: 68 (03/29 0806) ? ?Labs: ?Recent Labs  ?  07/20/21 ?1526 07/20/21 ?2149 07/21/21 ?0215 07/21/21 ?0086 07/21/21 ?1450 07/22/21 ?0258 07/23/21 ?0222  ?HGB  --    < > 8.5*  --   --  8.2* 9.0*  ?HCT  --   --  27.5*  --   --  26.9* 28.7*  ?PLT  --   --  221  --   --  239 236  ?LABPROT 15.3*  --   --   --   --   --   --   ?INR 1.2  --   --   --   --   --   --   ?HEPARINUNFRC  --    < > 0.28*   < > 0.45 0.55 0.36  ?CREATININE  --   --  1.62*  --   --  1.49* 1.29*  ? < > = values in this interval not displayed.  ? ? ? ?Estimated Creatinine Clearance: 66.4 mL/min (A) (by C-G formula based on SCr of 1.29 mg/dL (H)). ? ? ?Assessment: ?4 yom with a history of CAD, aortic stenosis, PAF, hypertension, hyperlipidemia, peripheral arterial disease s/p L BKA, chronic systolic heart failure with improved LVEF, diabetes mellitus and hx of DVT . Patient is presenting with chest pain. Heparin per pharmacy consult placed for atrial fibrillation. ? ?Patient is not on anticoagulation prior to arrival.  Patient had been on warfarin in the past but this was stopped at one time as risk was determined to be greater than benefit due to multiple admissions for anemia requiring blood cell transfusions. After further discussion with the patient, he has decided to proceed with long-term anticoagulation. As cost of DOACs are prohibitive, pharmacy has been consulted for warfarin re-initiation. Will target lower end of INR goal (2-2.5) due to history of normocytic anemia requiring warfarin discontinuation.  ? ?Heparin level remains therapeutic at 0.36 on  1650 units/hr. No bleeding noted, Hgb low stable 8-9s, platelets are WNL. Will transition to enoxaparin with no further anticipated procedures and need for bridging- patient should receive 5 days of parenteral bridging with 2x therapeutic INRs.  ? ?Previous home warfarin regimen: 7.5 m on M/W/F; 5 mg on all other days (noted from admission January 2023)  ?- First dose of inpatient warfarin 3/29  ? ?Goal of Therapy:  ?Heparin level 0.3-0.7 units/ml ?INR 2-2.5  ?Monitor platelets by anticoagulation protocol: Yes ?  ?Plan:  ?STOP heparin infusion ?START enoxaparin 150 mg [place] Q24H- give first dose at the same time heparin infusion is stopped ?Warfarin 7.5 mg x1  ?Daily CBC, INR  ?Monitor for s/sx of bleeding ? ?Thank you for involving pharmacy in this patient's care. ? ?Adria Dill, PharmD ?PGY-1 Acute Care Resident  ?07/23/2021 12:58 PM  ? ? ? ?  ?

## 2021-07-23 NOTE — Progress Notes (Addendum)
Family Medicine Teaching Service ?Daily Progress Note ?Intern Pager: [phone] ? ?Patient name: Rodney [name] Medical record number: [id number] ?Date of birth: 1951/01/29 Age: 71 y.o. Gender: male ? ?Primary Care Provider: [name], MD ?Consultants: Cardiology ?Code Status: Full ? ?Pt Overview and Major Events to Date:  ?3/26 admitted ? ?Assessment and Plan: ?Rodney [name] is a 71 y.o. male presenting with shortness of breath x1 week. PMH is significant for type 2 diabetes, obesity, tobacco abuse, hypertension, protein calorie malnutrition, aortic stenosis, anemia of chronic disease, PVD, HFrEF, PAF, s/p BKA, CAD, CKD stage III, COPD ?  ?Acute Hypoxic Respiratory distress 2/2 PNA vs CHF exacerbation ?COPD ?HFpEF exacerbation ?COVID-19 positive ?Resting comfortably on 2 L [place]. VSS. UOP ~2L yesterday. Net output 2 L.  Cr 1.49>1.29. Continue diuresis. No duoneb PRN yesterday. Blood culture no growth 48 hours.  ?-2 L [place] ?-AM CBC ?-AM BMP ?-Continue molnupiravir for COVID-19 (finish on 3/31) ?-IV Lasix 40 mg ?-Continue Incruse+Breo inhalers (Trelegy equivalent) ?-PRNs: Albuterol 2 puffs q6h PRN, DuoNeb q4h PRN ?-PT/OT ? ?Severe AS ?-Cardiology following, appreciate recs ?-Will require TAVR workup after recovering from [place] and stable peripheral vascular disease ?-Continue to hold Coreg as is normotensive ?-Palliative consult for [place] ? ?PAF/ now aflutter ?ECG showed 2-1 atrial flutter. HF recommends transitioning to PO DOAC. Was previously on coumadin but stopped in January due to GIB and anemia. Did not want eliquis due to cost prohibitiveness. Stopped amiodarone due to concern of chemical cardioversion.  ?-Cardiology following, appreciate recs ?-Transition from IV heparin to warfarin. Pharmacy consult placed. ?-STOP amiodarone 200 mg qd ?-Continue metoprolol 12.5 BID ? ? ?Elevated creatinine CKD stage III, improving ?Cr 1.49>1.29. Appears baseline 1.3-1.6. Anticipate further improvement with diuresis ?-AM  BMP ?-Avoid nephrotoxic agents ? ?Normocytic Anemia, likely 2/2 CKD ?8.2>9.0.  Patient takes ferrous sulfate 325 mg daily. Continues to downtrend ?-A.m. CBC ?-Continue home iron ? ?T2DM, stable ?Glucose stable. Last A1c of 6.9 on 03/24/2021.  Home medications include gabapentin 600 mg at bedtime, Levemir 25 units daily, metformin 1000 mg daily ?- Continue home Levemir 25Units daily ?- Holding metformin ?  ?GERD ?- Continue home Protonix 40 mg twice daily.   ?  ?Insomnia ?- Consider trazodone 25-50 mg at bedtime PRN for sleep if patient requires or switching to ramelteon ?  ?Tobacco abuse ?Smokes about 4-5 cigarettes per day. ?- Consider nicotine patch if requested ? ?FEN/GI: Cardiac ?PPx: IV heparin ?Dispo:Pending PT recommendations  in 2-3 days. Barriers include ongoing medical treatment.  ? ?Subjective:  ?Patient seen and assessed at bedside.  Patient reports no acute complaints at this time. Patient reports dyspnea still present but not acutely worsening. Discussed Eliquis and patient was willing to pay for it if it was $40. He reports it had previously been assessed to be $400 per month so he was unwilling to take it at that time. All questions addressed. Patient amenable to plan to continue diuresis. Patient still amenable to SNF after discharge. ? ?Objective: ?Temp:  [97.5 ?F (36.4 ?C)-98.2 ?F (36.8 ?C)] 97.9 ?F (36.6 ?C) (03/29 0535) ?Pulse Rate:  [69-82] 69 (03/29 0535) ?Resp:  [18-24] 24 (03/29 0535) ?BP: (105-143)/(76-80) 142/80 (03/29 0535) ?SpO2:  [96 %-100 %] 100 % (03/29 0535) ?Physical Exam: ?General: Resting comfortably in bed. NAD. On 2 L  ?Cardiovascular: RRR ?Respiratory: Diminished breath sounds ?Abdomen: soft, nontender ?Extremities: L BKA. 2+ RLE edema ? ?Laboratory: ?Recent Labs  ?Lab 07/21/21 ?0215 07/22/21 ?0258 07/23/21 ?0222  ?WBC 5.3 9.2 7.7  ?  HGB 8.5* 8.2* 9.0*  ?HCT 27.5* 26.9* 28.7*  ?PLT 221 239 236  ? ? ?Recent Labs  ?Lab 07/21/21 ?0215 07/22/21 ?0258 07/23/21 ?0222  ?NA 137 137  136  ?K 4.3 4.4 4.3  ?CL 103 100 98  ?CO2 '29 30 30  '$ ?BUN 38* 43* 40*  ?CREATININE 1.62* 1.49* 1.29*  ?CALCIUM 8.2* 8.3* 8.6*  ?PROT 5.5*  --   --   ?BILITOT 1.0  --   --   ?ALKPHOS 71  --   --   ?ALT 30  --   --   ?AST 15  --   --   ?GLUCOSE 208* 176* 181*  ? ? ? ? ?Imaging/Diagnostic Tests: ?No results found. ? ?France Ravens, MD ?07/23/2021, 7:14 AM ?PGY-1, [place] ?[place] Intern pager: [phone], text pages welcome ?

## 2021-07-23 NOTE — Evaluation (Signed)
Occupational Therapy Evaluation ?Patient Details ?Name: Rodney [name] ?MRN: [id number] ?DOB: October 13, 1950 ?Today's Date: 07/23/2021 ? ? ?History of Present Illness Pt is a 71 y/o M presenting to ED on 3/26 for respiratory distress secondary to PNA, COPD, and HFpEF exacerbation, COVID +, PMH includes CAD, CKD, HTN, hypercholesteremai, PVD, obesity, DM2, and L BKA.  ? ?Clinical Impression ?  ?Pt reports assistance at baseline with ADLs, states significant other assists with bathing and LB dressing. Mostly transfers at baseline in/out of w/c for mobility. Evaluation limited, pt able to perform UB/LB ROM while supine in bed, however declines bed mobility and transfers at this time due to decreased activity tolerance. States he was not able to roll onto side earlier with doctor present in room. Pt presenting with impairments listed below, will follow. Recommend SNF at d/c pending pt progress. ?   ? ?Recommendations for follow up therapy are one component of a multi-disciplinary discharge planning process, led by the attending physician.  Recommendations may be updated based on patient status, additional functional criteria and insurance authorization.  ? ?Follow Up Recommendations ? Skilled nursing-short term rehab (<3 hours/day)  ?  ?Assistance Recommended at Discharge Frequent or constant Supervision/Assistance  ?Patient can return home with the following A lot of help with walking and/or transfers;A lot of help with bathing/dressing/bathroom;Help with stairs or ramp for entrance;Assist for transportation;Assistance with cooking/housework ? ?  ?Functional Status Assessment ? Patient has had a recent decline in their functional status and demonstrates the ability to make significant improvements in function in a reasonable and predictable amount of time.  ?Equipment Recommendations ? None recommended by OT;Other (comment) (defer to next venue of care)  ?  ?Recommendations for Other Services   ? ? ?  ?Precautions /  Restrictions Precautions ?Precautions: Fall ?Required Braces or Orthoses: Other Brace ?Restrictions ?Weight Bearing Restrictions: No  ? ?  ? ?Mobility Bed Mobility ?  ?  ?  ?  ?  ?  ?  ?  ?  ? ?Transfers ?  ?  ?  ?  ?  ?  ?  ?  ?  ?General transfer comment: deferred, pt declining ?  ? ?  ?Balance   ?  ?  ?  ?  ?  ?  ?  ?  ?  ?  ?  ?  ?  ?  ?  ?  ?  ?  ?   ? ?ADL either performed or assessed with clinical judgement  ? ?ADL Overall ADL's : Needs assistance/impaired ?Eating/Feeding: Set up;Bed level ?Eating/Feeding Details (indicate cue type and reason): demonstrates ROM to self feed ?Grooming: Set up;Bed level ?Grooming Details (indicate cue type and reason): demonstrates UE ROM to perform grooming task ?Upper Body Bathing: Moderate assistance;Bed level ?  ?Lower Body Bathing: Maximal assistance;Bed level ?  ?Upper Body Dressing : Moderate assistance;Bed level ?  ?Lower Body Dressing: Maximal assistance;Bed level ?  ?Toilet Transfer: Maximal assistance;+2 for physical assistance;BSC/3in1;Stand-pivot ?  ?Toileting- Clothing Manipulation and Hygiene: Maximal assistance ?  ?  ?  ?Functional mobility during ADLs: Maximal assistance;+2 for physical assistance ?General ADL Comments: declines sitting and limited bed mobility at this time  ? ? ? ?Vision   ?Vision Assessment?: No apparent visual deficits  ?   ?Perception   ?  ?Praxis   ?  ? ?Pertinent Vitals/Pain Pain Assessment ?Pain Assessment: No/denies pain  ? ? ? ?Hand Dominance   ?  ?Extremity/Trunk Assessment Upper Extremity Assessment ?Upper Extremity Assessment: Overall WFL for  tasks assessed ?  ?Lower Extremity Assessment ?Lower Extremity Assessment: Defer to PT evaluation ?  ?  ?  ?Communication Communication ?Communication: No difficulties ?  ?Cognition Arousal/Alertness: Awake/alert ?Behavior During Therapy: Agitated, Flat affect ?Overall Cognitive Status: Within Functional Limits for tasks assessed ?  ?  ?  ?  ?  ?  ?  ?  ?  ?  ?  ?  ?  ?  ?  ?  ?  ?  ?  ?General  Comments  VSS on 2L O2 during session ? ?  ?Exercises   ?  ?Shoulder Instructions    ? ? ?Home Living Family/patient expects to be discharged to:: Private residence ?Living Arrangements: Spouse/significant other ?Available Help at Discharge: Family;Available 24 hours/day ?Type of Home: Mobile home ?Home Access: Ramped entrance ?  ?  ?Home Layout: One level ?  ?  ?Bathroom Shower/Tub: Tub/shower unit ?  ?Bathroom Toilet: Standard ?Bathroom Accessibility: Yes ?How Accessible: Accessible via wheelchair ?Home Equipment: Wheelchair - manual;Tub bench;BSC/3in1;Cane - single point;Rolling Walker (2 wheels) ?  ?Additional Comments: pt confirms home set up ?  ? ?  ?Prior Functioning/Environment Prior Level of Function : Needs assist ?  ?  ?  ?Physical Assist : Mobility (physical) ?Mobility (physical): Transfers ?  ?Mobility Comments: says girlfriend asssits with transfers, primarily uses w/c for mobility ?ADLs Comments: states girlfriend assists with bathing and LB dressing as needed ?  ? ?  ?  ?OT Problem List: Decreased strength;Decreased range of motion;Decreased knowledge of use of DME or AE;Decreased coordination;Decreased activity tolerance;Cardiopulmonary status limiting activity;Impaired balance (sitting and/or standing);Pain;Impaired UE functional use ?  ?   ?OT Treatment/Interventions: Self-care/ADL training;Therapeutic exercise;Patient/family education;Balance training;Therapeutic activities;DME and/or AE instruction  ?  ?OT Goals(Current goals can be found in the care plan section) Acute Rehab OT Goals ?Patient Stated Goal: to feel better ?OT Goal Formulation: With patient ?Time For Goal Achievement: 07/10/21 ?Potential to Achieve Goals: Good ?ADL Goals ?Pt Will Perform Grooming: with supervision;sitting;bed level ?Pt Will Perform Upper Body Dressing: with min assist;sitting;bed level ?Pt Will Perform Lower Body Dressing: with mod assist;sit to/from stand;sitting/lateral leans ?Pt Will Transfer to Toilet: with  mod assist;bedside commode;squat pivot transfer;stand pivot transfer ?Pt Will Perform Tub/Shower Transfer: with min assist;shower seat;Stand pivot transfer;Shower transfer;Tub transfer  ?OT Frequency: Min 2X/week ?  ? ?Co-evaluation PT/OT/SLP Co-Evaluation/Treatment: Yes ?Reason for Co-Treatment: Complexity of the patient's impairments (multi-system involvement);Necessary to address cognition/behavior during functional activity ?  ?OT goals addressed during session: Strengthening/ROM ?  ? ?  ?AM-PAC OT "6 Clicks" Daily Activity     ?Outcome Measure Help from another person eating meals?: None ?Help from another person taking care of personal grooming?: A Little ?Help from another person toileting, which includes using toliet, bedpan, or urinal?: A Lot ?Help from another person bathing (including washing, rinsing, drying)?: A Lot ?Help from another person to put on and taking off regular upper body clothing?: A Lot ?Help from another person to put on and taking off regular lower body clothing?: A Lot ?6 Click Score: 15 ?  ?End of Session Equipment Utilized During Treatment: Oxygen ?Nurse Communication: Mobility status ? ?Activity Tolerance: Treatment limited secondary to agitation ?Patient left: in bed;with call bell/phone within reach;with bed alarm set ? ?OT Visit Diagnosis: Muscle weakness (generalized) (M62.81);Pain;Other abnormalities of gait and mobility (R26.89)  ?              ?Time: 4196-2229 ?OT Time Calculation (min): 15 min ?Charges:  OT General Charges ?$OT Visit: 1  Visit ?OT Evaluation ?$OT Eval Low Complexity: 1 Low ? ?Lynnda Child, OTD, OTR/L ?Acute Rehab ?[phone] ? ?Kaylyn Lim ?07/23/2021, 12:19 PM ?

## 2021-07-23 NOTE — Progress Notes (Signed)
Pt refusing to use CPAP 

## 2021-07-24 DIAGNOSIS — U071 COVID-19: Secondary | ICD-10-CM

## 2021-07-24 DIAGNOSIS — I4892 Unspecified atrial flutter: Secondary | ICD-10-CM

## 2021-07-24 DIAGNOSIS — I350 Nonrheumatic aortic (valve) stenosis: Secondary | ICD-10-CM

## 2021-07-24 DIAGNOSIS — J189 Pneumonia, unspecified organism: Secondary | ICD-10-CM | POA: Diagnosis not present

## 2021-07-24 DIAGNOSIS — J441 Chronic obstructive pulmonary disease with (acute) exacerbation: Secondary | ICD-10-CM | POA: Diagnosis not present

## 2021-07-24 DIAGNOSIS — J9601 Acute respiratory failure with hypoxia: Secondary | ICD-10-CM | POA: Diagnosis not present

## 2021-07-24 DIAGNOSIS — I5033 Acute on chronic diastolic (congestive) heart failure: Secondary | ICD-10-CM

## 2021-07-24 LAB — CBC
HCT: 29.4 % — ABNORMAL LOW (ref 39.0–52.0)
Hemoglobin: 9.3 g/dL — ABNORMAL LOW (ref 13.0–17.0)
MCH: 29.2 pg (ref 26.0–34.0)
MCHC: 31.6 g/dL (ref 30.0–36.0)
MCV: 92.5 fL (ref 80.0–100.0)
Platelets: 235 10*3/uL (ref 150–400)
RBC: 3.18 MIL/uL — ABNORMAL LOW (ref 4.22–5.81)
RDW: 18.6 % — ABNORMAL HIGH (ref 11.5–15.5)
WBC: 8.8 10*3/uL (ref 4.0–10.5)
nRBC: 0 % (ref 0.0–0.2)

## 2021-07-24 LAB — BASIC METABOLIC PANEL
Anion gap: 7 (ref 5–15)
BUN: 36 mg/dL — ABNORMAL HIGH (ref 8–23)
CO2: 32 mmol/L (ref 22–32)
Calcium: 8.6 mg/dL — ABNORMAL LOW (ref 8.9–10.3)
Chloride: 99 mmol/L (ref 98–111)
Creatinine, Ser: 1.19 mg/dL (ref 0.61–1.24)
GFR, Estimated: >60 mL/min (ref >60)
Glucose, Bld: 55 mg/dL — ABNORMAL LOW (ref 70–99)
Potassium: 3.9 mmol/L (ref 3.5–5.1)
Sodium: 138 mmol/L (ref 135–145)

## 2021-07-24 LAB — PROTIME-INR
INR: 1.2 (ref 0.8–1.2)
Prothrombin Time: 15.4 seconds — ABNORMAL HIGH (ref 11.4–15.2)

## 2021-07-24 LAB — GLUCOSE, CAPILLARY
Glucose-Capillary: 133 mg/dL — ABNORMAL HIGH (ref 70–99)
Glucose-Capillary: 175 mg/dL — ABNORMAL HIGH (ref 70–99)

## 2021-07-24 LAB — MAGNESIUM: Magnesium: 1.8 mg/dL (ref 1.7–2.4)

## 2021-07-24 MED ORDER — MAGNESIUM SULFATE 2 GM/50ML IV SOLN
2.0000 g | Freq: Once | INTRAVENOUS | Status: AC
  Administered 2021-07-24: 2 g via INTRAVENOUS
  Filled 2021-07-24: qty 50

## 2021-07-24 MED ORDER — RAMELTEON 8 MG PO TABS: 8.0000 mg | ORAL_TABLET | Freq: Every day | ORAL | Status: DC

## 2021-07-24 MED ORDER — WARFARIN SODIUM 5 MG PO TABS: 5.0000 mg | ORAL_TABLET | Freq: Once | ORAL | Status: DC

## 2021-07-24 MED ORDER — INSULIN DETEMIR 100 UNIT/ML ~~LOC~~ SOLN
20.0000 [IU] | Freq: Every day | SUBCUTANEOUS | Status: DC
  Administered 2021-07-24 – 2021-07-26 (×3): 20 [IU] via SUBCUTANEOUS
  Filled 2021-07-24 (×3): qty 0.2

## 2021-07-24 MED ORDER — METFORMIN HCL 500 MG PO TABS
1000.0000 mg | ORAL_TABLET | Freq: Every day | ORAL | Status: DC
  Administered 2021-07-24 – 2021-07-29 (×6): 1000 mg via ORAL
  Filled 2021-07-24 (×6): qty 2

## 2021-07-24 MED ORDER — MELATONIN 5 MG PO TABS
5.0000 mg | ORAL_TABLET | Freq: Every day | ORAL | Status: DC
  Administered 2021-07-24 – 2021-07-28 (×5): 5 mg via ORAL
  Filled 2021-07-24 (×5): qty 1

## 2021-07-24 MED ORDER — FUROSEMIDE 40 MG PO TABS
40.0000 mg | ORAL_TABLET | Freq: Every day | ORAL | Status: DC
  Administered 2021-07-25 – 2021-07-29 (×5): 40 mg via ORAL
  Filled 2021-07-24 (×5): qty 1

## 2021-07-24 MED ORDER — POTASSIUM CHLORIDE CRYS ER 20 MEQ PO TBCR
40.0000 meq | EXTENDED_RELEASE_TABLET | Freq: Once | ORAL | Status: AC
  Administered 2021-07-24: 40 meq via ORAL
  Filled 2021-07-24: qty 2

## 2021-07-24 MED ORDER — GLUCOSE 4 G PO CHEW
CHEWABLE_TABLET | ORAL | Status: AC
  Filled 2021-07-24: qty 1

## 2021-07-24 MED ORDER — WARFARIN SODIUM 7.5 MG PO TABS
7.5000 mg | ORAL_TABLET | Freq: Once | ORAL | Status: AC
  Administered 2021-07-24: 7.5 mg via ORAL
  Filled 2021-07-24: qty 1

## 2021-07-24 MED ORDER — TRAZODONE HCL 50 MG PO TABS
50.0000 mg | ORAL_TABLET | Freq: Every day | ORAL | Status: DC
  Administered 2021-07-24 – 2021-07-28 (×5): 50 mg via ORAL
  Filled 2021-07-24 (×5): qty 1

## 2021-07-24 NOTE — Progress Notes (Incomplete)
ANTICOAGULATION CONSULT NOTE  ? ?Pharmacy Consult for enoxaparin/warfarin  ?Indication: atrial fibrillation, hx DVT ? ?No Known Allergies ? ?Patient Measurements: ?Height: 6' (182.9 cm) ?Weight: 100.7 kg (222 lb 0.1 oz) ?IBW/kg (Calculated) : 77.6 ?Heparin Dosing Weight: 98.1 kg ? ?Vital Signs: ?Temp: 98.3 ?F (36.8 ?C) (03/29 2330) ?BP: 138/74 (03/29 2330) ? ?Labs: ?Recent Labs  ?  07/21/21 ?1450 07/22/21 ?0258 07/22/21 ?0258 07/23/21 ?0222 07/24/21 ?0158  ?HGB  --  8.2*   < > 9.0* 9.3*  ?HCT  --  26.9*  --  28.7* 29.4*  ?PLT  --  239  --  236 235  ?LABPROT  --   --   --   --  15.4*  ?INR  --   --   --   --  1.2  ?HEPARINUNFRC 0.45 0.55  --  0.36  --   ?CREATININE  --  1.49*  --  1.29* 1.19  ? < > = values in this interval not displayed.  ? ? ? ?Estimated Creatinine Clearance: 71.9 mL/min (by C-G formula based on SCr of 1.19 mg/dL). ? ? ?Assessment: ?76 yom with a history of CAD, aortic stenosis, PAF, hypertension, hyperlipidemia, peripheral arterial disease s/p L BKA, chronic systolic heart failure with improved LVEF, diabetes mellitus and hx of DVT . Patient is presenting with chest pain. Heparin per pharmacy consult placed for atrial fibrillation. ? ?Patient is not on anticoagulation prior to arrival.  Patient had been on warfarin in the past but this was stopped at one time as risk was determined to be greater than benefit due to multiple admissions for anemia requiring blood cell transfusions. After further discussion with the patient, he has decided to proceed with long-term anticoagulation. As cost of DOACs are prohibitive, pharmacy has been consulted for warfarin re-initiation. Will target lower end of INR goal (2-2.5) due to history of normocytic anemia requiring warfarin discontinuation.  ? ?Patient transitioned to enoxaparin with no further anticipated procedures and need for bridging- patient should receive 5 days of parenteral bridging with 2x therapeutic INRs.  ? ?Previous home warfarin regimen: 7.5 m  on M/W/F; 5 mg on all other days (noted from admission January 2023)  ?- First dose of inpatient warfarin 3/29  ? ?Goal of Therapy:  ?Heparin level 0.3-0.7 units/ml ?INR 2-2.5  ?Monitor platelets by anticoagulation protocol: Yes ?  ?Plan:  ?Continue enoxaparin 150 mg [place] Q24H ?Warfarin 5 mg x1  ?Daily CBC, INR  ?Monitor for s/sx of bleeding ? ?Adria Dill, PharmD ?PGY-1 Acute Care Resident  ?07/24/2021 6:35 AM  ? ? ? ?  ?

## 2021-07-24 NOTE — Progress Notes (Signed)
Received message this am regarding CBC glucose of 55 from MD at Internal Medicine.   ? ?Need orders to check glucose on unit.   ? ?Spoke with Dr. Madison Hickman.  Orders will be provided to check cbg on unit. ? ?Nurse was not notified by lab regarding morning cbg results. ? ?Provided Glucose chewables '4mg'$ .  Patient is having breakfast.  He is alert and oriented x 4. ? ?Day shift to follow up. ?

## 2021-07-24 NOTE — NC FL2 (Signed)
?[place] MEDICAID FL2 LEVEL OF CARE SCREENING TOOL  ?  ? ?IDENTIFICATION  ?Patient Name: ?Rodney [name] Birthdate: 29-Nov-1950 Sex: male Admission Date (Current Location): ?07/20/2021  ?[place] and [place] Number: ? [place] ?  Facility and Address:  ?[facility], [place] [address], [place], [place] [address] ?     Provider Number: ?[id number]  ?Attending Physician Name and Address:  ?Dickie La, MD ? Relative Name and Phone Number:  ?[place] Significant other [phone]  [phone] ?   ?Current Level of Care: ?Hospital Recommended Level of Care: ?[place] Prior Approval Number: ?  ? ?Date Approved/Denied: ?  PASRR Number: ?[id number] A ? ?Discharge Plan: ?SNF ?  ? ?Current Diagnoses: ?Patient Active Problem List  ? Diagnosis Date Noted  ? Pneumonia 07/20/2021  ? Pleural effusion   ? Pressure injury of skin 06/20/2021  ? C. difficile diarrhea 06/20/2021  ? BMI 36.0-36.9,adult 06/20/2021  ? Hypoalbuminemia 06/20/2021  ? Diarrhea 06/19/2021  ? Acute respiratory failure with hypoxia ([place])   ? Acute on chronic diastolic heart failure ([place])   ? Chronic respiratory failure with hypoxia ([place]) 03/26/2021  ? Acute kidney injury superimposed on chronic kidney disease ([place]) 03/26/2021  ? Peripheral neuropathy 11/11/2020  ? History of DVT (deep vein thrombosis) 11/10/2020  ? Acute on chronic anemia   ? Ascending aorta dilation ([place]) 08/04/2020  ? 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 ([place]) 08/04/2020  ? 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  ? CAD (coronary artery disease) 02/15/2020  ? CKD stage 3 due to type 2 diabetes mellitus ([place]) 02/15/2020  ? S/P BKA (below knee amputation) unilateral, left ([place]) 11/14/2019  ? High risk social situation 11/14/2019  ? Atrial flutter ([place])   ? AF (paroxysmal atrial fibrillation) ([place]) 10/03/2019  ? HFrEF (heart failure with reduced ejection fraction) ([place]) 09/28/2019  ? Severe aortic stenosis 09/28/2019  ? Anemia of chronic disease 09/27/2019  ?  Protein calorie malnutrition ([place])   ? PVD (peripheral vascular disease) ([place])   ? Umbilical hernia 09/47/0962  ? Morbid obesity, unspecified obesity type ([place]) 10/09/2015  ? Tobacco abuse 09/07/2013  ? DM (diabetes mellitus), type 2 with neurological complications ([place]) 83/66/2947  ? Hypercholesteremia 10/28/2010  ? ERECTILE DYSFUNCTION 05/22/2009  ? Essential hypertension 01/23/2009  ? ? ?Orientation RESPIRATION BLADDER Height & Weight   ?  ?Self, Time, Situation, Place ? O2 Continent, External catheter Weight: 222 lb 0.1 oz (100.7 kg) ?Height:  6' (182.9 cm)  ?BEHAVIORAL SYMPTOMS/MOOD NEUROLOGICAL BOWEL NUTRITION STATUS  ?    Continent Diet (see discharge summary)  ?AMBULATORY STATUS COMMUNICATION OF NEEDS Skin   ?Total Care Verbally Normal ?  ?  ?  ?    ?     ?     ? ? ?Personal Care Assistance Level of Assistance  ?  Bathing Assistance: Maximum assistance ?Feeding assistance: Limited assistance ?Dressing Assistance: Maximum assistance ?   ? ?Functional Limitations Info  ?Sight, Hearing, Speech Sight Info: Adequate ?Hearing Info: Adequate ?Speech Info: Adequate  ? ? ?SPECIAL CARE FACTORS FREQUENCY  ?PT (By licensed PT), OT (By licensed OT)   ?  ?PT Frequency: 5x week ?OT Frequency: 5x week ?  ?  ?  ?   ? ? ?Contractures Contractures Info: Not present  ? ? ?Additional Factors Info  ?Code Status, Allergies Code Status Info: full ?Allergies Info: NKA ?  ?  ?  ?   ? ?Current Medications (07/24/2021):  This is the current hospital active medication list ?Current  Facility-Administered Medications  ?Medication Dose Route Frequency Provider Last Rate Last Admin  ? albuterol (PROVENTIL) (2.5 MG/3ML) 0.083% nebulizer solution 3 mL  3 mL Inhalation Q6H PRN Lilland, Alana, DO   3 mL at 07/23/21 2018  ? atorvastatin (LIPITOR) tablet 80 mg  80 mg Oral Daily Lilland, Alana, DO   80 mg at 07/24/21 0847  ? carvedilol (COREG) tablet 6.25 mg  6.25 mg Oral BID WC Donato Heinz, MD   6.25 mg at 07/24/21 1517  ? clopidogrel  (PLAVIX) tablet 75 mg  75 mg Oral Daily Lilland, Alana, DO   75 mg at 07/24/21 0847  ? enoxaparin (LOVENOX) injection 150 mg  150 mg Subcutaneous Q24H Carollee Leitz, MD   150 mg at 07/24/21 1518  ? ferrous sulfate tablet 325 mg  325 mg Oral QODAY Lilland, Alana, DO   325 mg at 07/24/21 0847  ? fluticasone furoate-vilanterol (BREO ELLIPTA) 100-25 MCG/ACT 1 puff  1 puff Inhalation Daily Lilland, Alana, DO   1 puff at 07/24/21 0843  ? And  ? umeclidinium bromide (INCRUSE ELLIPTA) 62.5 MCG/ACT 1 puff  1 puff Inhalation Daily Lilland, Alana, DO   1 puff at 07/24/21 0843  ? [START ON 07/25/2021] furosemide (LASIX) tablet 40 mg  40 mg Oral Daily Donato Heinz, MD      ? insulin detemir (LEVEMIR) injection 20 Units  20 Units Subcutaneous Daily France Ravens, MD   20 Units at 07/24/21 1100  ? ipratropium-albuterol (DUONEB) 0.5-2.5 (3) MG/3ML nebulizer solution 3 mL  3 mL Nebulization Q4H PRN Lilland, Alana, DO   3 mL at 07/21/21 2038  ? melatonin tablet 5 mg  5 mg Oral QHS Precious Gilding, DO      ? metFORMIN (GLUCOPHAGE) tablet 1,000 mg  1,000 mg Oral Q breakfast France Ravens, MD   1,000 mg at 07/24/21 1517  ? molnupiravir EUA (LAGEVRIO) capsule 800 mg  4 capsule Oral BID Wells Guiles, DO   800 mg at 07/24/21 1100  ? pantoprazole (PROTONIX) EC tablet 40 mg  40 mg Oral BID Lilland, Alana, DO   40 mg at 07/24/21 0848  ? traZODone (DESYREL) tablet 50 mg  50 mg Oral QHS Precious Gilding, DO      ? Warfarin - Pharmacist Dosing Inpatient   Does not apply q1600 Gorden Harms, [facility]   Given at 07/24/21 1518  ? ? ? ?Discharge Medications: ?Please see discharge summary for a list of discharge medications. ? ?Relevant Imaging Results: ? ?Relevant Lab Results: ? ? ?Additional Information ?SSN [id number]. PT CURRENTLY COVID POSITIVE TEST DATE: 07/20/21.  Pt also vaccinated:  PFIZER Comrnaty([place] TOP) Covid-19 Vaccine 07/03/2020  Pfizer COVID-19 Vaccine 12/15/2019 , 11/23/2019  Pfizer Covid-19 Vaccine Bivalent Booster 02/13/2021 ? ?Joanne Chars, LCSW ? ? ? ? ?

## 2021-07-24 NOTE — Progress Notes (Signed)
? ?Progress Note ? ?Patient Name: Rodney [name] ?Date of Encounter: 07/24/2021 ? ?[facility] Cardiologist: Evalina Field, MD  ? ?Subjective  ? ?Incomplete I/os yesterday.  Net -3.9 L on admission BP 151/76 this morning.  Creatinine has continued to improve, now normal at 1.19. Denies chest pain.  Reports dyspnea improved. ?, ?Inpatient Medications  ?  ?Scheduled Meds: ? atorvastatin  80 mg Oral Daily  ? carvedilol  6.25 mg Oral BID WC  ? clopidogrel  75 mg Oral Daily  ? enoxaparin (LOVENOX) injection  150 mg Subcutaneous Q24H  ? ferrous sulfate  325 mg Oral QODAY  ? fluticasone furoate-vilanterol  1 puff Inhalation Daily  ? And  ? umeclidinium bromide  1 puff Inhalation Daily  ? furosemide  40 mg Intravenous Daily  ? insulin detemir  20 Units Subcutaneous Daily  ? molnupiravir EUA  4 capsule Oral BID  ? pantoprazole  40 mg Oral BID  ? potassium chloride  40 mEq Oral Once  ? ramelteon  8 mg Oral QHS  ? warfarin  7.5 mg Oral ONCE-1600  ? Warfarin - Pharmacist Dosing Inpatient   Does not apply M3817  ? ?Continuous Infusions: ? ? ?PRN Meds: ?albuterol, ipratropium-albuterol  ? ?Vital Signs  ?  ?Vitals:  ? 07/23/21 0806 07/23/21 1550 07/23/21 2330 07/24/21 7116  ?BP: (!) 148/84 (!) 152/77 138/74 (!) 151/76  ?Pulse: 68 74  70  ?Resp: '20 18  18  '$ ?Temp: 98.1 ?F (36.7 ?C)  98.3 ?F (36.8 ?C) 98.1 ?F (36.7 ?C)  ?TempSrc: Oral   Oral  ?SpO2: 97% 93%  93%  ?Weight:      ?Height:      ? ? ?Intake/Output Summary (Last 24 hours) at 07/24/2021 1045 ?Last data filed at 07/24/2021 0813 ?Gross per 24 hour  ?Intake 1104 ml  ?Output 1800 ml  ?Net -696 ml  ? ? ? ?  07/20/2021  ?  4:35 AM 07/17/2021  ?  9:33 AM 06/26/2021  ?  5:00 AM  ?Last 3 Weights  ?Weight (lbs) 222 lb 0.1 oz 222 lb 270 lb 4.5 oz  ?Weight (kg) 100.7 kg 100.699 kg 122.6 kg  ?   ? ?Telemetry  ?  ?AFL 3:1 conduction, rate 70s - Personally Reviewed ? ?ECG  ?  ?No new ECG - Personally Reviewed ? ?Physical Exam  ? ?GEN: No acute distress.   ?Neck: +JVD ?Cardiac: RRR, 3/6  systolic murmur ?Respiratory: Diminished breath sounds ?GI: Soft, nontender ?MS: 1+ RLE edema, s/p L BKA ?Neuro:  Nonfocal  ?Psych: Normal affect  ? ?Labs  ?  ?High Sensitivity Troponin:   ?Recent Labs  ?Lab 07/20/21 ?5790 07/20/21 ?3833  ?TROPONINIHS 57* 75*  ? ?   ?Chemistry ?Recent Labs  ?Lab 07/21/21 ?0215 07/22/21 ?3832 07/23/21 ?0222 07/24/21 ?0158  ?NA 137 137 136 138  ?K 4.3 4.4 4.3 3.9  ?CL 103 100 98 99  ?CO2 '29 30 30 '$ 32  ?GLUCOSE 208* 176* 181* 55*  ?BUN 38* 43* 40* 36*  ?CREATININE 1.62* 1.49* 1.29* 1.19  ?CALCIUM 8.2* 8.3* 8.6* 8.6*  ?MG  --   --   --  1.8  ?PROT 5.5*  --   --   --   ?ALBUMIN 2.1*  --   --   --   ?AST 15  --   --   --   ?ALT 30  --   --   --   ?ALKPHOS 71  --   --   --   ?  BILITOT 1.0  --   --   --   ?GFRNONAA 46* 50* >60 >60  ?ANIONGAP '5 7 8 7  '$ ? ?  ?Lipids No results for input(s): CHOL, TRIG, HDL, LABVLDL, LDLCALC, CHOLHDL in the last 168 hours.  ?Hematology ?Recent Labs  ?Lab 07/22/21 ?0258 07/23/21 ?0222 07/24/21 ?0158  ?WBC 9.2 7.7 8.8  ?RBC 2.87* 3.12* 3.18*  ?HGB 8.2* 9.0* 9.3*  ?HCT 26.9* 28.7* 29.4*  ?MCV 93.7 92.0 92.5  ?MCH 28.6 28.8 29.2  ?MCHC 30.5 31.4 31.6  ?RDW 18.8* 18.8* 18.6*  ?PLT 239 236 235  ? ? ?Thyroid No results for input(s): TSH, FREET4 in the last 168 hours.  ?BNP ?Recent Labs  ?Lab 07/20/21 ?7989  ?BNP 1,079.7*  ? ?  ?DDimer No results for input(s): DDIMER in the last 168 hours.  ? ?Radiology  ?  ?No results found. ? ?Cardiac Studies  ? ?Echo 06/23/21: ? 1. Left ventricular ejection fraction, by estimation, is 55 to 60%. The  ?left ventricle has normal function. The left ventricle has no regional  ?wall motion abnormalities. There is mild concentric left ventricular  ?hypertrophy. Left ventricular diastolic  ?parameters are indeterminate.  ? 2. Right ventricular systolic function is normal. The right ventricular  ?size is normal. There is mildly elevated pulmonary artery systolic  ?pressure.  ? 3. Left atrial size was mildly dilated.  ? 4. Right atrial size was  mildly dilated.  ? 5. The mitral valve is grossly normal. Mild mitral valve regurgitation.  ?Moderate mitral annular calcification.  ? 6. The aortic valve is calcified. There is severe calcifcation of the  ?aortic valve. There is severe thickening of the aortic valve. Aortic valve  ?regurgitation is trivial. Severe aortic valve stenosis. Aortic valve area,  ?by VTI measures 0.75 cm?Marland Kitchen Aortic  ?valve mean gradient measures 43.0 mmHg. Aortic valve Vmax measures 4.30  ?m/s. DI 0.22  ? 7. Aortic dilatation noted. There is borderline dilatation of the aortic  ?root, measuring 36 mm. There is mild dilatation of the ascending aorta,  ?measuring 38 mm.  ? 8. The inferior vena cava is dilated in size with <50% respiratory  ?variability, suggesting right atrial pressure of 15 mmHg.  ? 9. A small pericardial effusion is present. The pericardial effusion is  ?posterior and lateral to the left ventricle.  ? ?Patient Profile  ?   ?71 y.o. male  with a hx of CAD, aortic stenosis, PAF, hypertension, hyperlipidemia, peripheral arterial disease s/p L BKA, chronic systolic heart failure with improved LVEF, diabetes mellitus and hx of DVT who is being seen 07/20/2021 for the evaluation of chest pain ? ?Assessment & Plan  ?  ?Acute on chronic diastolic heart failure ?-BNP 1079.  Chest x-ray with bilateral multilobular bronchopneumonia and left pleural effusion. ?-Strict INO and daily weights ?-Volume status improved, suspect approaching euvolemia.  Received IV lasix this morning, will transition to PO lasix tomorrow ?  ?2.  Severe aortic stenosis ?-Most recent echo with mean gradient of 43 mmHg.  He will need TAVR work-up when recovers from [place] and stable from peripheral vascular perspective. ?-Difficult situation.  Plan outpatient follow-up in valve clinic ?  ?3.  CAD ?-Last cath was in 2021.  No chest pain ?-On Plavix ?  ?4.  Paroxysmal atrial fibrillation/ now atrial flutter  ?-Patient was on Coumadin when last seen by cardiology  service 10/2020 but had stopped taking ?-Currently in  3:1 atrial flutter.  He was in sinus rhythm on last admission.  He  was resumed on amiodarone and heparin.  Would favor stopping amiodarone given risk of chemical cardioversion in patient who has been off AC.  Continue carvedilol.  Restarted warfarin for anticoagulation ?  ?5. Hx of DVT in April 2022 ?- unclear if this was a coumadin failure or result of interruption in [facility] for PAD intervention ?  ?6. PAD s/p L BKA ?-  Recommended amputation of his gangrenous second toe 05/2021 during admission but  declined. ?  ?7.  Acute hypoxic respiratory failure secondary to COVID-19 pneumonia ?-Per primary team ? ? ?For questions or updates, please contact [place] ?Please consult [email or web address] for contact info under  ? ?  ?   ?Signed, ?Donato Heinz, MD  ?07/24/2021, 10:45 AM   ? ?

## 2021-07-24 NOTE — Progress Notes (Addendum)
Family Medicine Teaching Service ?Daily Progress Note ?Intern Pager: [phone] ? ?Patient name: Rodney [name] Medical record number: [id number] ?Date of birth: [birth year] Age: 71 y.o. Gender: male ? ?Primary Care Provider: [name], MD ?Consultants: Cardiology ?Code Status: Full ? ?Pt Overview and Major Events to Date:  ?3/26 admitted ? ?Assessment and Plan: ?Rodney [name] is a 71 y.o. male presenting with shortness of breath x1 week. PMH is significant for type 2 diabetes, obesity, tobacco abuse, hypertension, protein calorie malnutrition, aortic stenosis, anemia of chronic disease, PVD, HFrEF, PAF, s/p BKA, CAD, CKD stage III, COPD ? ?Patient is medically clear for SNF placement ? ?Acute Hypoxic Respiratory distress 2/2 PNA vs CHF exacerbation ?COPD ?HFpEF exacerbation ?COVID-19 positive ?Resting comfortably on 2L [name] (baseline). Used albuterol once yesterday. No duonebs prn. UOP 1.8 L (does not appear to have been documented yesterday morning), net output 0.7 L. Patient reports having multiple urine bags replaced yesterday. Cr 1.29>1.19. Seen by PT and recommended for SNF so will put [facility] consult for SNF placement. Will ask RN to document daily weights. Still appears volume overloaded. Continue diuresis. ?-Strict I/O ?-Daily weights ?-2L [place] (baseline) ?-AM CBC ?-AM BMP ?-Continue molnupiravir for COVID-19 (finish on 3/31) ?-IV Lasix 40 mg qd, will consider transition to PO tomorrow ?-Continue Incruse+Breo inhalers (Trelegy equivalent) ?-PRNs: Albuterol 2 puffs q6h PRN, DuoNeb q4h PRN ?-OT eval ?-TOC consult for SNF placement ? ?PAF/ now aflutter ?Currently in 3:1 aflutter. Bridged to lovenox from IV heparin with ultimate plan to start Coumadin for oral AC. Rate controlled at this time. ?-Coumadin per pharmacy consult ?-Switched from metoprolol to coreg 6.25 bid ? ?Severe AS ?Hospitalization day 5 with COVID+. Palliative to speak with patient regarding [place]. Will defer to Cardiology for when TAVR  workup is appropriate if patient would like to pursue further workup. ?-Cardiology following, appreciate recs ?-Will require TAVR workup outpatient ?-Palliative consult for [place] ? ?CKD stage IIIa, improving ?Cr 1.29>1.19. Better than baseline at this time.  ?-AM BMP ?-Avoid nephrotoxic agents ? ?Normocytic Anemia, likely 2/2 CKD ?9.0>9.3.  Patient takes ferrous sulfate 325 mg daily. Continues to downtrend ?-A.m. CBC ?-Continue home iron ? ?T2DM, stable ?Hypoglycemic this morning 55 but asymptomatic. May be due to discontinuation of decadron. Patient also reports poor PO intake yesterday due to lack of appetite. Last A1c of 6.9 on 03/24/2021.  Home medications include gabapentin 600 mg at bedtime, Levemir 25 units daily, metformin 1000 mg daily ?- Decrease Levemir to 20 Units daily ?- CBG with meals ?- Resume metformin ? ?PAD s/p L BKA ?Gangrenous second toe appears dry. Refused amputation last admission. Stable otherwise. Asked RN to change dressing today. ?-Continue to monitor ? ?Insomnia ?Previously been on trazodone 20 mg. ?-Start trazodone 50 mg qhs ?-Start melatonin 5 mg qhs ? ? ?FEN/GI:  ?PPx: Coumadin ?Dispo:SNF in 2-3 days. Barriers include SNF placement. ? ?Subjective:  ?Patient seen and assessed at bedside. Reports poor sleep last night. Requesting sleep med. Otherwise, reports breathing has been stable. Denies cp, n/v, diarrhea, constipation, dysuria, urinary retention. [place] with going back to SNF later. ? ?Objective: ?Temp:  [98.1 ?F (36.7 ?C)-98.3 ?F (36.8 ?C)] 98.3 ?F (36.8 ?C) (03/29 2330) ?Pulse Rate:  [68-74] 74 (03/29 1550) ?Resp:  [18-20] 18 (03/29 1550) ?BP: (138-152)/(74-84) 138/74 (03/29 2330) ?SpO2:  [93 %-97 %] 93 % (03/29 1550) ?Physical Exam: ?General: Resting comfortably. NAD.  ?Cardiovascular: Systolic murmur. RRR. ?Respiratory: Diminished breath sounds. ?Abdomen: Soft, nontender ?Extremities: 2+ RLE edema, s/p L BKA ? ?  Laboratory: ?Recent Labs  ?Lab 07/22/21 ?0258 07/23/21 ?0222  07/24/21 ?0158  ?WBC 9.2 7.7 8.8  ?HGB 8.2* 9.0* 9.3*  ?HCT 26.9* 28.7* 29.4*  ?PLT 239 236 235  ? ?Recent Labs  ?Lab 07/21/21 ?0215 07/22/21 ?3419 07/23/21 ?0222 07/24/21 ?0158  ?NA 137 137 136 138  ?K 4.3 4.4 4.3 3.9  ?CL 103 100 98 99  ?CO2 '29 30 30 '$ 32  ?BUN 38* 43* 40* 36*  ?CREATININE 1.62* 1.49* 1.29* 1.19  ?CALCIUM 8.2* 8.3* 8.6* 8.6*  ?PROT 5.5*  --   --   --   ?BILITOT 1.0  --   --   --   ?ALKPHOS 71  --   --   --   ?ALT 30  --   --   --   ?AST 15  --   --   --   ?GLUCOSE 208* 176* 181* 55*  ? ? ? ? ?Imaging/Diagnostic Tests: ?No results found. ? ?France Ravens, MD ?07/24/2021, 7:29 AM ?PGY-1, [place] ?[place] Intern pager: [phone], text pages welcome ?

## 2021-07-24 NOTE — Progress Notes (Signed)
FPTS Brief Note ?Reviewed patient's vitals, recent notes.  ?Vitals:  ? 07/24/21 1513 07/24/21 1957  ?BP: 132/71 130/63  ?Pulse:  81  ?Resp: 20 20  ?Temp: 98.1 ?F (36.7 ?C) 97.7 ?F (36.5 ?C)  ?SpO2:  94%  ? ?At this time, no change in plan from day progress note.  ?[name], DO ?Page [phone] with questions about this patient.  ?  ?

## 2021-07-24 NOTE — Progress Notes (Signed)
Inpatient Diabetes Program Recommendations ? ?AACE/ADA: New Consensus Statement on Inpatient Glycemic Control (2015) ? ?Target Ranges:  Prepandial:   less than 140 mg/dL ?     Peak postprandial:   less than 180 mg/dL (1-2 hours) ?     Critically ill patients:  140 - 180 mg/dL  ? ?Lab Results  ?Component Value Date  ? GLUCAP 196 (H) 06/30/2021  ? HGBA1C 6.9 (H) 03/24/2021  ? ? ?Review of Glycemic Control ? Latest Reference Range & Units 07/24/21 01:58  ?Glucose 70 - 99 mg/dL 55 (L)  ? ?Diabetes history: DM 2 ?Outpatient Diabetes medications:  ?Levemir 25 units daily, Metformin 1000 mg daily ?Current orders for Inpatient glycemic control:  ?Levemir 20 units daily ?Inpatient Diabetes Program Recommendations:   ?Please consider adding Novolog sensitive tid with meals.  May need further reduction in Levemir.  ? ?Thanks,  ?Adah Perl, RN, BC-ADM ?Inpatient Diabetes Coordinator ?Pager [phone]  (8a-5p) ? ? ?

## 2021-07-24 NOTE — Progress Notes (Signed)
PT Cancellation Note ? ?Patient Details ?Name: Rodney [name] ?MRN: [id number] ?DOB: 07-25-1950 ? ? ?Cancelled Treatment:    Reason Eval/Treat Not Completed: Patient declined, no reason specified pt stating he is sick and refusing to mobilize, encouraged OOB to chair, pt stating he will not, encourage bed level mobility, pt continues to refuse. Educated pt re; benefits of mobility and pulmonary health. Will check back as schedule allows to continue with PT POC. ? ? ? ?Betsey Holiday Khalis Hittle ?07/24/2021, 11:40 AM ?

## 2021-07-24 NOTE — Consult Note (Signed)
? ?Palliative Care Consult Note  ?                                ?Date: 07/24/2021  ? ?Patient Name: Rodney [name]  ?DOB: [birth year]  MRN: [id number]  Age / Sex: 71 y.o., male  ?PCP: [name], MD ?Referring Physician: Dickie La, MD ? ?Reason for Consultation: Establishing goals of care ? ?HPI/Patient Profile: 71 y.o. male  with past medical history of type 2 diabetes, obesity, tobacco abuse, hypertension, protein calorie malnutrition, aortic stenosis, anemia of chronic disease, PVD, HFrEF, PAF, s/p BKA, CAD, CKD stage III, COPD admitted on 07/20/2021 with shortness of breath x 1 week. He tested positive for COVID-19. Has a gangrenous toe but has thus far declined toe amputation. Significant AS and cardiology recommended outpatient workup for TAVR.  ? ?PMT was consulted for [place] discussion.  ? ?Past Medical History:  ?Diagnosis Date  ? Acute combined systolic and diastolic congestive heart failure ([place])   ? Acute on chronic heart failure with preserved ejection fraction (HFpEF) ([place])   ? Acute respiratory failure with hypoxia ([place])   ? Aortic stenosis   ? moderate in 2022  ? Atrial fibrillation ([place])   ? CHF (congestive heart failure) ([place])   ? COPD exacerbation ([place]) 12/09/2020  ? Coronary artery disease   ? Demand ischemia ([place]) 03/26/2021  ? Diabetes mellitus without complication ([place])   ? HLD (hyperlipidemia)   ? Hypertension   ? Long term (current) use of anticoagulants 12/29/2019  ? Malnutrition of moderate degree 05/09/2021  ? Peripheral arterial disease ([place])   ? Pneumonia due to COVID-19 virus 05/29/2021  ? ? ?Subjective:  ? ?This NP Walden Field reviewed medical records, received report from team, assessed the patient and then meet at the patient's bedside to discuss diagnosis, prognosis, GOC, EOL wishes disposition and options. ? ?I met with the patient at the bedside. ?  ?Concept of Palliative Care was introduced as specialized medical care for people  and their families living with serious illness.  If focuses on providing relief from the symptoms and stress of a serious illness.  The goal is to improve quality of life for both the patient and the family. Values and goals of care important to patient and family were attempted to be elicited. ? ?Created space and opportunity for patient  and family to explore thoughts and feelings regarding current medical situation ?  ?Natural trajectory and current clinical status were discussed. Questions and concerns addressed. Patient  encouraged to call with questions or concerns.   ? ?Patient/Family Understanding of Illness: ?When asked about his current medical/clinical status he states he knows he has COVID and A-fib.  When asked about his valve he knows it is bad and they have offered possibly work-up for an outpatient procedure.  We discussed the TAVR procedure and says that he is definitely interested.  When asked about his toe he states he feels is getting better with wound care, although he cannot see the bottom.  He does not know possible options.  He states he does not want his toe amputated but if that was the difference between being able to have TAVR or not then "I guess I would let them take it off." ? ?We had further discussion about his clinical status and multiple comorbidities and how they impact each other.  Discussed risks of procedure with infected/gangrenous toe.  Discussed the need  to improve from COVID standpoint to be candidate for any procedure.  He verbalized understanding. ? ?Life Review: ?The patient states he has a girlfriend of 27 years.  He was previously married x2 and divorced x2, each after 12 years.  He has 2 children who live in [place].  He is involved with one of them, but not the others.  At this point in the conversation he became a bit agitated and stated "you are asking very personal information and is none of your damn business".  I shared that it is in the interest of making sure  that should he have an unexpected, unanticipated term we cannot make his decisions that we would know who to contact to make decisions.  He states "they" know who to call, implying the hospital.  Patient on any further attempts and discussion. ? ?Patient Values: ?Deferred ? ?Goals: ?Get home eventually ? ?Today's Discussion: ?See notes above about discussion of surrogate decision maker.  He does state that he wants his girlfriend to make his decisions.  However, there is no ACP documents on file and I do not see any on the chart either.  Should he have an expected turn his children would have legal authority to make his decisions for him.  I tried to make this clear to him but he shut down the conversation and did not want to talk about personal information like that.   ? ?He is agreeable to SNF, and was recently there.  He does want TAVR procedure if possible.  He seems amenable to toe amputation if this is necessary to proceed with further treatments.  ? ?Before I left the room I helped the CNA slide the patient up in bed.  I offered emotional and general support through therapeutic listening, empathy.  I answered all questions and addressed all concerns to the best of my ability. ? ?Review of Systems  ?Constitutional:  Positive for appetite change (with COVID infection).  ?     Denies pain in general  ?Respiratory:  Positive for shortness of breath (worse with exertion). Negative for cough.   ?Gastrointestinal:  Negative for nausea and vomiting.  ?Neurological:  Positive for weakness.  ? ?Objective:  ? ?Primary Diagnoses: ?Present on Admission: ? Pneumonia ? ? ?Physical Exam ?Vitals and nursing note reviewed.  ?Constitutional:   ?   General: He is sleeping.  ?HENT:  ?   Head: Normocephalic and atraumatic.  ?Cardiovascular:  ?   Rate and Rhythm: Normal rate.  ?Pulmonary:  ?   Effort: Pulmonary effort is normal. No respiratory distress.  ?Abdominal:  ?   General: Abdomen is protuberant.  ?   Palpations: Abdomen is  soft.  ?Skin: ?   General: Skin is warm and dry.  ?Neurological:  ?   General: No focal deficit present.  ?   Mental Status: He is easily aroused.  ?Psychiatric:     ?   Mood and Affect: Affect is angry (intermittently).     ?   Behavior: Behavior is agitated.  ? ? ?Vital Signs:  ?BP (!) 151/76 (BP Location: Right Arm)   Pulse 70   Temp 98.1 ?F (36.7 ?C) (Oral)   Resp 18   Ht 6' (1.829 m)   Wt 100.7 kg   SpO2 93%   BMI 30.11 kg/m?  ? ?Palliative Assessment/Data: 60-70% ? ? ? ?Advanced Care Planning:  ? ?Primary Decision Maker: ?PATIENT ? ?Code Status/Advance Care Planning: ?Full code ? ?Decisions/Changes to ACP: ?None today ? ?Assessment &  Plan:  ? ?Impression: ?71 year old male with COPD, heart failure, chronic kidney disease, severe aortic stenosis, paroxysmal A-fib, significant peripheral artery disease, gangrenous toe was admitted with dyspnea and diagnosed with COVID-19 currently on antiviral treatment due to complete today.  Attempted goals of care discussion and exploration of family relationships to determine legal next of kin, although the patient to shut this down and became agitated asking personal questions.  He is insistent that "they know who to call if needed".  I worry that with no ACP documents identified that it will result to legal decision making by his children only 1 of which she has an active relationship with.  Fortunately the patient does seem amenable to toe amputation if it is required for TAVR.  He does want TAVR procedure to help with his dyspnea related to AAS.  Plans at this point are discharged to SNF and the goal is to eventually get home.  Plans in place for outpatient TAVR work-up. ? ?SUMMARY OF RECOMMENDATIONS   ?Remain full code ?Continue patient support ?Plan outpatient work-up for possible TAVR ?PMT will follow peripherally given that patient seems to not want some ? ?Symptom Management:  ?Per primary team ?PMT is available to assist as needed ? ?Prognosis:  ?Unable to  determine ? ?Discharge Planning:  ?SNF   ? ?Discussed with: Medical team, nursing team, patient ? ? ? ?Thank you for allowing [place] to participate in the care of SIDNEY KANN ?PMT will continue to support h

## 2021-07-24 NOTE — TOC Initial Note (Signed)
Transition of Care (TOC) - Initial/Assessment Note  ? ? ?Patient Details  ?Name: Rodney [name] ?MRN: [id number] ?Date of Birth: June 18, 1950 ? ?Transition of Care [facility]) CM/SW Contact:    ?Joanne Chars, LCSW ?Phone Number: ?07/24/2021, 3:30 PM ? ?Clinical Narrative:   CSW spoke with pt by phone due to covid positive status, test date 07/20/21.  Pt initially stated he was trying to sleep, didn't want to talk, agreed with some encouragement.  Pt lives at home with girlfriend [name], is agreeable to SNF recommendation.  Permission given to send out referral in hub and to speak with girlfriend.  CSW discussed pt declining to participate with PT and encouraged pt to do so moving forward.  Pt's response was that he felt lousy due to covid and wasn't going to participate until he felt better.     ? ?Referral sent out in hub for SNF.             ? ? ?Expected Discharge Plan: [place] ?Barriers to Discharge: Continued Medical Work up, Other (must enter comment), SNF Pending bed offer (covid positive test date 3/26) ? ? ?Patient Goals and CMS Choice ?Patient states their goals for this hospitalization and ongoing recovery are:: "get better and go home" ?  ?  ? ?Expected Discharge Plan and Services ?Expected Discharge Plan: [place] ?In-house Referral: Clinical Social Work ?  ?Post Acute Care Choice: [place] ?Living arrangements for the past 2 months: [place] ?                ?  ?  ?  ?  ?  ?  ?  ?  ?  ?  ? ?Prior Living Arrangements/Services ?Living arrangements for the past 2 months: [place] ?Lives with:: Significant Other ?Patient language and need for interpreter reviewed:: Yes ?Do you feel safe going back to the place where you live?: Yes      ?Need for Family Participation in Patient Care: No (Comment) ?Care giver support system in place?: Yes (comment) ?Current home services: Other (comment) (na) ?Criminal Activity/Legal Involvement Pertinent to  Current Situation/Hospitalization: No - Comment as needed ? ?Activities of Daily Living ?Home Assistive Devices/Equipment: Wheelchair, Environmental consultant (specify type), Crutches, Cane (specify quad or straight) ?ADL Screening (condition at time of admission) ?Patient's cognitive ability adequate to safely complete daily activities?: Yes ?Is the patient deaf or have difficulty hearing?: Yes ?Does the patient have difficulty seeing, even when wearing glasses/contacts?: No ?Does the patient have difficulty concentrating, remembering, or making decisions?: No ?Patient able to express need for assistance with ADLs?: Yes ?Does the patient have difficulty dressing or bathing?: Yes ?Independently performs ADLs?: No ?Communication: Independent ?Dressing (OT): Needs assistance ?Is this a change from baseline?: Change from baseline, expected to last >3 days (10 years ago had a hip accident) ?Grooming: Needs assistance ?Is this a change from baseline?: Change from baseline, expected to last >3 days (10 year ago because of h ip) ?Feeding: Independent ?Bathing: Needs assistance ?Is this a change from baseline?: Change from baseline, expected to last >3 days (per pt greater than 10 years ago) ?Toileting: Needs assistance ?Is this a change from baseline?: Change from baseline, expected to last >3days (dea;ing with this since along tme patient said he is just weak today) ?In/Out Bed: Independent with device (comment) ?Walks in Home: Needs assistance (using wheel chair at home) ?Is this a change from baseline?: Change from baseline, expected to last >3 days (said this  been going on for months) ?Does the patient have difficulty walking or climbing stairs?: Yes ?Weakness of Legs: Both ?Weakness of Arms/Hands: Both ? ?Permission Sought/Granted ?Permission sought to share information with : Family Supports ?Permission granted to share information with : Yes, Verbal Permission Granted ? Share Information with NAME: girlfriend [name] ?   ?   ?    ? ?Emotional Assessment ?Appearance::  (phone contact as pt is covid positive) ?Attitude/Demeanor/Rapport: Engaged ?Affect (typically observed): Irritable ?Orientation: : Oriented to Self, Oriented to Place, Oriented to  Time, Oriented to Situation ?Alcohol / Substance Use: Not Applicable ?Psych Involvement: No (comment) ? ?Admission diagnosis:  Pneumonia [J18.9] ?COPD exacerbation ([place]) [J44.1] ?Community acquired pneumonia, unspecified laterality [J18.9] ?Patient Active Problem List  ? Diagnosis Date Noted  ? Pneumonia 07/20/2021  ? Pleural effusion   ? Pressure injury of skin 06/20/2021  ? C. difficile diarrhea 06/20/2021  ? BMI 36.0-36.9,adult 06/20/2021  ? Hypoalbuminemia 06/20/2021  ? Diarrhea 06/19/2021  ? Acute respiratory failure with hypoxia ([place])   ? Acute on chronic diastolic heart failure ([place])   ? Chronic respiratory failure with hypoxia ([place]) 03/26/2021  ? Acute kidney injury superimposed on chronic kidney disease ([place]) 03/26/2021  ? Peripheral neuropathy 11/11/2020  ? History of DVT (deep vein thrombosis) 11/10/2020  ? Acute on chronic anemia   ? Ascending aorta dilation ([place]) 08/04/2020  ? 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 ([place]) 08/04/2020  ? 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  ? CAD (coronary artery disease) 02/15/2020  ? CKD stage 3 due to type 2 diabetes mellitus ([place]) 02/15/2020  ? S/P BKA (below knee amputation) unilateral, left ([place]) 11/14/2019  ? High risk social situation 11/14/2019  ? Atrial flutter ([place])   ? AF (paroxysmal atrial fibrillation) ([place]) 10/03/2019  ? HFrEF (heart failure with reduced ejection fraction) ([place]) 09/28/2019  ? Severe aortic stenosis 09/28/2019  ? Anemia of chronic disease 09/27/2019  ? Protein calorie malnutrition ([place])   ? PVD (peripheral vascular disease) ([place])   ? Umbilical hernia 16/01/9603  ? Morbid obesity, unspecified obesity type ([place]) 10/09/2015  ? Tobacco abuse 09/07/2013  ? DM (diabetes mellitus), type 2 with neurological complications ([place]) 54/12/8117  ?  Hypercholesteremia 10/28/2010  ? ERECTILE DYSFUNCTION 05/22/2009  ? Essential hypertension 01/23/2009  ? ?PCP:  [name], MD ?Pharmacy:   ?[place], [place] - [address] ?[address] ?[place] [address] ?Phone: [phone] Fax: [phone] ? ?[facility] ?[address] ?[place] [address] ?Phone: [phone] Fax: [phone] ? ? ? ? ?Social Determinants of Health (SDOH) Interventions ?  ? ?Readmission Risk Interventions ? ?  06/26/2021  ?  4:36 PM 05/16/2021  ?  3:13 PM 03/26/2021  ?  1:31 PM  ?Readmission Risk Prevention Plan  ?Transportation Screening Complete Complete Complete  ?Medication Review Press photographer) Complete Complete Complete  ?PCP or Specialist appointment within 3-5 days of discharge Complete Complete   ?[place] or Home Care Consult Complete Complete   ?SW Recovery Care/Counseling Consult Complete Complete Complete  ?Palliative Care Screening Not Applicable Not Applicable Not Applicable  ?Skilled Nursing Facility Complete Not Applicable Not Applicable  ? ? ? ?

## 2021-07-24 NOTE — Progress Notes (Addendum)
ANTICOAGULATION CONSULT NOTE  ? ?Pharmacy Consult for enoxaparin/warfarin  ?Indication: atrial fibrillation, hx DVT ? ?No Known Allergies ? ?Patient Measurements: ?Height: 6' (182.9 cm) ?Weight: 100.7 kg (222 lb 0.1 oz) ?IBW/kg (Calculated) : 77.6 ?Heparin Dosing Weight: 98.1 kg ? ?Vital Signs: ?Temp: 98.3 ?F (36.8 ?C) (03/29 2330) ?BP: 138/74 (03/29 2330) ? ?Labs: ?Recent Labs  ?  07/21/21 ?1450 07/22/21 ?0258 07/22/21 ?0258 07/23/21 ?0222 07/24/21 ?0158  ?HGB  --  8.2*   < > 9.0* 9.3*  ?HCT  --  26.9*  --  28.7* 29.4*  ?PLT  --  239  --  236 235  ?LABPROT  --   --   --   --  15.4*  ?INR  --   --   --   --  1.2  ?HEPARINUNFRC 0.45 0.55  --  0.36  --   ?CREATININE  --  1.49*  --  1.29* 1.19  ? < > = values in this interval not displayed.  ? ? ? ?Estimated Creatinine Clearance: 71.9 mL/min (by C-G formula based on SCr of 1.19 mg/dL). ? ? ?Assessment: ?9 yom with a history of CAD, aortic stenosis, PAF, hypertension, hyperlipidemia, peripheral arterial disease s/p L BKA, chronic systolic heart failure with improved LVEF, diabetes mellitus and hx of DVT . Patient is presenting with chest pain. Heparin per pharmacy consult placed for atrial fibrillation. ? ?Patient is not on anticoagulation prior to arrival.  Patient had been on warfarin in the past but this was stopped at one time as risk was determined to be greater than benefit due to multiple admissions for anemia requiring blood cell transfusions. After further discussion with the patient, he has decided to proceed with long-term anticoagulation. As cost of DOACs are prohibitive, pharmacy has been consulted for warfarin re-initiation. Will target lower end of INR goal (2-2.5) due to history of normocytic anemia requiring warfarin discontinuation.  ? ?Patient transitioned to enoxaparin with no further anticipated procedures and need for bridging- patient should receive 5 days of parenteral bridging with 2x therapeutic INRs. INR subtherapeutic 3/30 AM at 1.2 s/p a  single dose of warfarin. No signs of bleeding noted per RN and patient. Hgb stable 8-9s, PLT WNL.  ? ?Previous home warfarin regimen: 7.5 m on M/W/F; 5 mg on all other days (noted from admission January 2023)  ?- First dose of inpatient warfarin 3/29  ? ?Goal of Therapy:  ?Heparin level 0.3-0.7 units/ml ?INR 2-2.5  ?Monitor platelets by anticoagulation protocol: Yes ?  ?Plan:  ?Continue enoxaparin 150 mg [place] Q24H ?Warfarin 7.5 mg x1  ?Daily CBC, INR  ?Monitor for s/sx of bleeding ? ?Adria Dill, PharmD ?PGY-1 Acute Care Resident  ?07/24/2021 6:37 AM  ? ? ? ?  ?

## 2021-07-25 DIAGNOSIS — I350 Nonrheumatic aortic (valve) stenosis: Secondary | ICD-10-CM | POA: Diagnosis not present

## 2021-07-25 DIAGNOSIS — I4892 Unspecified atrial flutter: Secondary | ICD-10-CM | POA: Diagnosis not present

## 2021-07-25 DIAGNOSIS — J9601 Acute respiratory failure with hypoxia: Secondary | ICD-10-CM | POA: Diagnosis not present

## 2021-07-25 DIAGNOSIS — I5033 Acute on chronic diastolic (congestive) heart failure: Secondary | ICD-10-CM | POA: Diagnosis not present

## 2021-07-25 DIAGNOSIS — J189 Pneumonia, unspecified organism: Secondary | ICD-10-CM | POA: Diagnosis not present

## 2021-07-25 LAB — CBC
HCT: 27.6 % — ABNORMAL LOW (ref 39.0–52.0)
Hemoglobin: 8.5 g/dL — ABNORMAL LOW (ref 13.0–17.0)
MCH: 28.3 pg (ref 26.0–34.0)
MCHC: 30.8 g/dL (ref 30.0–36.0)
MCV: 92 fL (ref 80.0–100.0)
Platelets: 191 10*3/uL (ref 150–400)
RBC: 3 MIL/uL — ABNORMAL LOW (ref 4.22–5.81)
RDW: 18.3 % — ABNORMAL HIGH (ref 11.5–15.5)
WBC: 6.4 10*3/uL (ref 4.0–10.5)
nRBC: 0 % (ref 0.0–0.2)

## 2021-07-25 LAB — CULTURE, BLOOD (ROUTINE X 2)
Culture: NO GROWTH
Culture: NO GROWTH
Special Requests: ADEQUATE
Special Requests: ADEQUATE

## 2021-07-25 LAB — BASIC METABOLIC PANEL
Anion gap: 6 (ref 5–15)
BUN: 28 mg/dL — ABNORMAL HIGH (ref 8–23)
CO2: 34 mmol/L — ABNORMAL HIGH (ref 22–32)
Calcium: 8.3 mg/dL — ABNORMAL LOW (ref 8.9–10.3)
Chloride: 97 mmol/L — ABNORMAL LOW (ref 98–111)
Creatinine, Ser: 1.09 mg/dL (ref 0.61–1.24)
GFR, Estimated: >60 mL/min (ref >60)
Glucose, Bld: 83 mg/dL (ref 70–99)
Potassium: 3.9 mmol/L (ref 3.5–5.1)
Sodium: 137 mmol/L (ref 135–145)

## 2021-07-25 LAB — PROTIME-INR
INR: 1.6 — ABNORMAL HIGH (ref 0.8–1.2)
Prothrombin Time: 19.3 seconds — ABNORMAL HIGH (ref 11.4–15.2)

## 2021-07-25 MED ORDER — WARFARIN SODIUM 5 MG PO TABS
5.0000 mg | ORAL_TABLET | Freq: Once | ORAL | Status: AC
  Administered 2021-07-25: 5 mg via ORAL
  Filled 2021-07-25: qty 1

## 2021-07-25 NOTE — Progress Notes (Signed)
Family Medicine Teaching Service ?Daily Progress Note ?Intern Pager: [phone] ? ?Patient name: Rodney [name] Medical record number: [id number] ?Date of birth: 1950/05/11 Age: 71 y.o. Gender: male ? ?Primary Care Provider: [name], MD ?Consultants: Cardiology ?Code Status: Full ? ?Pt Overview and Major Events to Date:  ?3/26 admitted ? ?Assessment and Plan: ?NORMAL RECINOS is a 71 y.o. male presenting with shortness of breath x1 week. PMH is significant for type 2 diabetes, obesity, tobacco abuse, hypertension, protein calorie malnutrition, aortic stenosis, anemia of chronic disease, PVD, HFrEF, PAF, s/p BKA, CAD, CKD stage III, COPD ? ?Patient is medically clear for SNF placement ? ?Acute Hypoxic Respiratory distress 2/2 PNA vs CHF exacerbation ?COPD ?HFpEF exacerbation ?COVID-19 positive ?Resting comfortably on 2L [place] (baseline). Used albuterol once yesterday. No duonebs prn. UOP 4.85 L (does not appear to have been documented yesterday morning), net output 4.85 L yesterday (likely including day before UOP). Patient reports having multiple urine bags replaced yesterday. Cr 1.19>1.09. Seen by PT and recommended for SNF so will put [facility] consult for SNF placement. Encourage participating in PT. ?-Strict I/O ?-Daily weights ?-2L [place] (baseline) ?-AM CBC ?-AM BMP ?-Continue molnupiravir for COVID-19 (finish on 3/31) ?-PO Lasix 40 mg qd ?-Continue Incruse+Breo inhalers (Trelegy equivalent) ?-PRNs: Albuterol 2 puffs q6h PRN, DuoNeb q4h PRN ?-OT eval ?-TOC consult for SNF placement ? ?PAF/ now aflutter ?Currently in 3:1 aflutter. Bridged to lovenox from IV heparin with ultimate plan to start Coumadin for oral AC. Rate controlled at this time. ?-Coumadin per pharmacy consult ?-Switched from metoprolol to coreg 6.25 bid ? ?Severe AS ?Hospitalization day 6 with COVID+. Palliative to speak with patient regarding [place]. Cardiology recommends outpatient TAVR workup. Palliative saw patient and appears patient wants full  scope. ?-Cardiology following, appreciate recs ?-Will require TAVR workup outpatient ? ?CKD stage IIIa, improving ?Cr 1.29>1.19. Better than baseline at this time.  ?-AM BMP ?-Avoid nephrotoxic agents ? ?Normocytic Anemia, likely 2/2 CKD ?9.3>8.5.  Patient takes ferrous sulfate 325 mg daily. Continues to downtrend ?-A.m. CBC ?-Continue home iron ? ?T2DM, stable ?CBG stable. Home medications include gabapentin 600 mg at bedtime, Levemir 25 units daily, metformin 1000 mg daily ?- Continue Levemir to 20 Units daily ?- CBG every morning ?- Resume metformin ? ?PAD s/p L BKA ?Gangrenous second toe appears dry. Refused amputation last admission. Stable otherwise. Asked RN to change dressing today. ?-Continue to monitor ? ?Insomnia ?Previously been on trazodone 20 mg. ?-Continue trazodone 50 mg qhs ?-Continue melatonin 5 mg qhs ? ?FEN/GI: Heart Healthy ?PPx: Coumadin ?Dispo:SNF in 2-3 days. Barriers include SNF placement. ? ?Subjective:  ?Patient seen and assessed at bedside.  Patient reports feeling "the same".  Patient reports breathing is at baseline.  Patient is sometimes very resistant to PT; however, patient did say that he would attempt but "no promises".  Patient feels tired and is frustrated that he is persistently short of breath with minimal mobility. ? ?Objective: ?Temp:  [97.7 ?F (36.5 ?C)-98.1 ?F (36.7 ?C)] 97.7 ?F (36.5 ?C) (03/30 1957) ?Pulse Rate:  [81] 81 (03/30 1957) ?Resp:  [20] 20 (03/30 1957) ?BP: (130-132)/(63-71) 130/63 (03/30 1957) ?SpO2:  [94 %] 94 % (03/30 1957) ?Physical Exam: ?General: Resting comfortably. NAD.  ?Cardiovascular: Systolic murmur. RRR. ?Respiratory: Diminished breath sounds. ?Abdomen: Soft, nontender ?Extremities: 1+ RLE edema, s/p L BKA ? ?Laboratory: ?Recent Labs  ?Lab 07/23/21 ?0222 07/24/21 ?0158 07/25/21 ?0330  ?WBC 7.7 8.8 6.4  ?HGB 9.0* 9.3* 8.5*  ?HCT 28.7* 29.4* 27.6*  ?PLT  236 235 191  ? ? ?Recent Labs  ?Lab 07/21/21 ?0215 07/22/21 ?0258 07/23/21 ?0222 07/24/21 ?0158  07/25/21 ?0330  ?NA 137   < > 136 138 137  ?K 4.3   < > 4.3 3.9 3.9  ?CL 103   < > 98 99 97*  ?CO2 29   < > 30 32 34*  ?BUN 38*   < > 40* 36* 28*  ?CREATININE 1.62*   < > 1.29* 1.19 1.09  ?CALCIUM 8.2*   < > 8.6* 8.6* 8.3*  ?PROT 5.5*  --   --   --   --   ?BILITOT 1.0  --   --   --   --   ?ALKPHOS 71  --   --   --   --   ?ALT 30  --   --   --   --   ?AST 15  --   --   --   --   ?GLUCOSE 208*   < > 181* 55* 83  ? < > = values in this interval not displayed.  ? ? ? ? ? ?Imaging/Diagnostic Tests: ?No results found. ? ?France Ravens, MD ?07/25/2021, 8:17 AM ?PGY-1, [place] ?[place] Intern pager: [phone], text pages welcome ?

## 2021-07-25 NOTE — Progress Notes (Signed)
? ?Progress Note ? ?Patient Name: Rodney [name] ?Date of Encounter: 07/25/2021 ? ?[facility] Cardiologist: Evalina Field, MD  ? ?Subjective  ? ?Incomplete I/os yesterday.  Net -8.7 L on admission BP 130/63 this morning.  Creatinine has continued to improve, now normal at 1.09. Denies chest pain.  Reports dyspnea improved. ?, ?Inpatient Medications  ?  ?Scheduled Meds: ? atorvastatin  80 mg Oral Daily  ? carvedilol  6.25 mg Oral BID WC  ? clopidogrel  75 mg Oral Daily  ? enoxaparin (LOVENOX) injection  150 mg Subcutaneous Q24H  ? ferrous sulfate  325 mg Oral QODAY  ? fluticasone furoate-vilanterol  1 puff Inhalation Daily  ? And  ? umeclidinium bromide  1 puff Inhalation Daily  ? furosemide  40 mg Oral Daily  ? insulin detemir  20 Units Subcutaneous Daily  ? melatonin  5 mg Oral QHS  ? metFORMIN  1,000 mg Oral Q breakfast  ? molnupiravir EUA  4 capsule Oral BID  ? pantoprazole  40 mg Oral BID  ? traZODone  50 mg Oral QHS  ? warfarin  5 mg Oral ONCE-1600  ? Warfarin - Pharmacist Dosing Inpatient   Does not apply Q0347  ? ?Continuous Infusions: ? ? ?PRN Meds: ?albuterol, ipratropium-albuterol  ? ?Vital Signs  ?  ?Vitals:  ? 07/24/21 0812 07/24/21 1513 07/24/21 1957 07/25/21 0820  ?BP: (!) 151/76 132/71 130/63   ?Pulse: 70  81   ?Resp: '18 20 20   '$ ?Temp: 98.1 ?F (36.7 ?C) 98.1 ?F (36.7 ?C) 97.7 ?F (36.5 ?C)   ?TempSrc: Oral Oral Oral   ?SpO2: 93%  94% 94%  ?Weight:      ?Height:      ? ? ?Intake/Output Summary (Last 24 hours) at 07/25/2021 1137 ?Last data filed at 07/25/2021 4259 ?Gross per 24 hour  ?Intake --  ?Output 2450 ml  ?Net -2450 ml  ? ? ? ?  07/20/2021  ?  4:35 AM 07/17/2021  ?  9:33 AM 06/26/2021  ?  5:00 AM  ?Last 3 Weights  ?Weight (lbs) 222 lb 0.1 oz 222 lb 270 lb 4.5 oz  ?Weight (kg) 100.7 kg 100.699 kg 122.6 kg  ?   ? ?Telemetry  ?  ?AFL 3:1 conduction, rate 70s - Personally Reviewed ? ?ECG  ?  ?No new ECG - Personally Reviewed ? ?Physical Exam  ? ?GEN: No acute distress.   ?Neck: No JVD ?Cardiac:  RRR, 3/6 systolic murmur ?Respiratory:Scattered wheezing ?GI: Soft, nontender ?MS: 1+ RLE edema, s/p L BKA ?Neuro:  Nonfocal  ?Psych: Normal affect  ? ?Labs  ?  ?High Sensitivity Troponin:   ?Recent Labs  ?Lab 07/20/21 ?5638 07/20/21 ?7564  ?TROPONINIHS 57* 75*  ? ?   ?Chemistry ?Recent Labs  ?Lab 07/21/21 ?0215 07/22/21 ?0258 07/23/21 ?0222 07/24/21 ?0158 07/25/21 ?0330  ?NA 137   < > 136 138 137  ?K 4.3   < > 4.3 3.9 3.9  ?CL 103   < > 98 99 97*  ?CO2 29   < > 30 32 34*  ?GLUCOSE 208*   < > 181* 55* 83  ?BUN 38*   < > 40* 36* 28*  ?CREATININE 1.62*   < > 1.29* 1.19 1.09  ?CALCIUM 8.2*   < > 8.6* 8.6* 8.3*  ?MG  --   --   --  1.8  --   ?PROT 5.5*  --   --   --   --   ?ALBUMIN 2.1*  --   --   --   --   ?  AST 15  --   --   --   --   ?ALT 30  --   --   --   --   ?ALKPHOS 71  --   --   --   --   ?BILITOT 1.0  --   --   --   --   ?GFRNONAA 46*   < > >60 >60 >60  ?ANIONGAP 5   < > '8 7 6  '$ ? < > = values in this interval not displayed.  ? ?  ?Lipids No results for input(s): CHOL, TRIG, HDL, LABVLDL, LDLCALC, CHOLHDL in the last 168 hours.  ?Hematology ?Recent Labs  ?Lab 07/23/21 ?0222 07/24/21 ?0158 07/25/21 ?0330  ?WBC 7.7 8.8 6.4  ?RBC 3.12* 3.18* 3.00*  ?HGB 9.0* 9.3* 8.5*  ?HCT 28.7* 29.4* 27.6*  ?MCV 92.0 92.5 92.0  ?MCH 28.8 29.2 28.3  ?MCHC 31.4 31.6 30.8  ?RDW 18.8* 18.6* 18.3*  ?PLT 236 235 191  ? ? ?Thyroid No results for input(s): TSH, FREET4 in the last 168 hours.  ?BNP ?Recent Labs  ?Lab 07/20/21 ?5009  ?BNP 1,079.7*  ? ?  ?DDimer No results for input(s): DDIMER in the last 168 hours.  ? ?Radiology  ?  ?No results found. ? ?Cardiac Studies  ? ?Echo 06/23/21: ? 1. Left ventricular ejection fraction, by estimation, is 55 to 60%. The  ?left ventricle has normal function. The left ventricle has no regional  ?wall motion abnormalities. There is mild concentric left ventricular  ?hypertrophy. Left ventricular diastolic  ?parameters are indeterminate.  ? 2. Right ventricular systolic function is normal. The right  ventricular  ?size is normal. There is mildly elevated pulmonary artery systolic  ?pressure.  ? 3. Left atrial size was mildly dilated.  ? 4. Right atrial size was mildly dilated.  ? 5. The mitral valve is grossly normal. Mild mitral valve regurgitation.  ?Moderate mitral annular calcification.  ? 6. The aortic valve is calcified. There is severe calcifcation of the  ?aortic valve. There is severe thickening of the aortic valve. Aortic valve  ?regurgitation is trivial. Severe aortic valve stenosis. Aortic valve area,  ?by VTI measures 0.75 cm?Marland Kitchen Aortic  ?valve mean gradient measures 43.0 mmHg. Aortic valve Vmax measures 4.30  ?m/s. DI 0.22  ? 7. Aortic dilatation noted. There is borderline dilatation of the aortic  ?root, measuring 36 mm. There is mild dilatation of the ascending aorta,  ?measuring 38 mm.  ? 8. The inferior vena cava is dilated in size with <50% respiratory  ?variability, suggesting right atrial pressure of 15 mmHg.  ? 9. A small pericardial effusion is present. The pericardial effusion is  ?posterior and lateral to the left ventricle.  ? ?Patient Profile  ?   ?71 y.o. male  with a hx of CAD, aortic stenosis, PAF, hypertension, hyperlipidemia, peripheral arterial disease s/p L BKA, chronic systolic heart failure with improved LVEF, diabetes mellitus and hx of DVT who is being seen 07/20/2021 for the evaluation of chest pain ? ?Assessment & Plan  ?  ?Acute on chronic diastolic heart failure ?-BNP 1079.  Chest x-ray with bilateral multilobular bronchopneumonia and left pleural effusion. ?-Strict INO and daily weights ?-Volume status improved, will avoid overdiuresis given severe AS, will transition to PO lasix today ?  ?2.  Severe aortic stenosis ?-Most recent echo with mean gradient of 43 mmHg.  He will need TAVR work-up when recovers from [place] and stable from peripheral vascular perspective. ?-Difficult situation.  Plan outpatient  follow-up in valve clinic ?  ?3.  CAD ?-Last cath was in 2021.  No  chest pain ?-On Plavix ?  ?4.  Paroxysmal atrial fibrillation/ now atrial flutter  ?-Patient was on Coumadin when last seen by cardiology service 10/2020 but had stopped taking ?-Currently in 3:1 atrial flutter.  He was in sinus rhythm on last admission.  He was resumed on amiodarone and heparin.  Would favor stopping amiodarone given risk of chemical cardioversion in patient who has been off AC.  Continue carvedilol.  Restarted warfarin for anticoagulation ?  ?5. Hx of DVT in April 2022 ?- unclear if this was a coumadin failure or result of interruption in [facility] for PAD intervention ?  ?6. PAD s/p L BKA ?-  Recommended amputation of his gangrenous second toe 05/2021 during admission but  declined. ?  ?7.  Acute hypoxic respiratory failure secondary to COVID-19 pneumonia ?-Per primary team ? ?[facility] will sign off.   ?Medication Recommendations: Atorvastatin 80 mg daily, carvedilol 6.25 mg twice daily, Plavix 75 mg daily, Lasix 40 mg daily, warfarin per pharmacy ?Other recommendations (labs, testing, etc): BMET within 1 week ?Follow up as an outpatient: Will schedule ? ? ? ?For questions or updates, please contact [place] ?Please consult [email or web address] for contact info under  ? ?  ?   ?Signed, ?Donato Heinz, MD  ?07/25/2021, 11:37 AM   ? ?

## 2021-07-25 NOTE — TOC Progression Note (Signed)
Transition of Care (TOC) - Progression Note  ? ? ?Patient Details  ?Name: Rodney [name] ?MRN: [id number] ?Date of Birth: 29-Jul-1950 ? ?Transition of Care (TOC) CM/SW Contact  ?Joanne Chars, LCSW ?Phone Number: ?07/25/2021, 10:32 AM ? ?Clinical Narrative:   Per [facility], pt was at that facility from 06/30/21-07/14/21.  Approx 6 days left prior to copay days starting.   ? ? ? ?Expected Discharge Plan: [place] ?Barriers to Discharge: Continued Medical Work up, Other (must enter comment), SNF Pending bed offer (covid positive test date 3/26) ? ?Expected Discharge Plan and Services ?Expected Discharge Plan: [place] ?In-house Referral: Clinical Social Work ?  ?Post Acute Care Choice: [place] ?Living arrangements for the past 2 months: [place] ?                ?  ?  ?  ?  ?  ?  ?  ?  ?  ?  ? ? ?Social Determinants of Health (SDOH) Interventions ?  ? ?Readmission Risk Interventions ? ?  06/26/2021  ?  4:36 PM 05/16/2021  ?  3:13 PM 03/26/2021  ?  1:31 PM  ?Readmission Risk Prevention Plan  ?Transportation Screening Complete Complete Complete  ?Medication Review Press photographer) Complete Complete Complete  ?PCP or Specialist appointment within 3-5 days of discharge Complete Complete   ?[place] or Home Care Consult Complete Complete   ?SW Recovery Care/Counseling Consult Complete Complete Complete  ?Palliative Care Screening Not Applicable Not Applicable Not Applicable  ?Skilled Nursing Facility Complete Not Applicable Not Applicable  ? ? ?

## 2021-07-25 NOTE — TOC Progression Note (Signed)
Transition of Care (TOC) - Progression Note  ? ? ?Patient Details  ?Name: Rodney [name] ?MRN: [id number] ?Date of Birth: 06-Nov-1950 ? ?Transition of Care (TOC) CM/SW Contact  ?Joanne Chars, LCSW ?Phone Number: ?07/25/2021, 3:21 PM ? ?Clinical Narrative:   Pt received bed offers from [facility] and [facility], both can accept him covid positive prior to him completing quarantine, this was confirmed with Martin Majestic.  CSW spoke with pt and he accepts offer to [facility].  ? ?CSW spoke with [facility] and they do manage this policy for SNF auth.  ? ?Auth initiated with [employer].   ? ? ? ?Expected Discharge Plan: [place] ?Barriers to Discharge: Continued Medical Work up, Other (must enter comment), SNF Pending bed offer (covid positive test date 3/26) ? ?Expected Discharge Plan and Services ?Expected Discharge Plan: [place] ?In-house Referral: Clinical Social Work ?  ?Post Acute Care Choice: [place] ?Living arrangements for the past 2 months: [place] ?                ?  ?  ?  ?  ?  ?  ?  ?  ?  ?  ? ? ?Social Determinants of Health (SDOH) Interventions ?  ? ?Readmission Risk Interventions ? ?  06/26/2021  ?  4:36 PM 05/16/2021  ?  3:13 PM 03/26/2021  ?  1:31 PM  ?Readmission Risk Prevention Plan  ?Transportation Screening Complete Complete Complete  ?Medication Review Press photographer) Complete Complete Complete  ?PCP or Specialist appointment within 3-5 days of discharge Complete Complete   ?[place] or Home Care Consult Complete Complete   ?SW Recovery Care/Counseling Consult Complete Complete Complete  ?Palliative Care Screening Not Applicable Not Applicable Not Applicable  ?Skilled Nursing Facility Complete Not Applicable Not Applicable  ? ? ?

## 2021-07-25 NOTE — Progress Notes (Signed)
ANTICOAGULATION CONSULT NOTE  ? ?Pharmacy Consult for enoxaparin/warfarin  ?Indication: atrial fibrillation, hx DVT ? ?No Known Allergies ? ?Patient Measurements: ?Height: 6' (182.9 cm) ?Weight: 100.7 kg (222 lb 0.1 oz) ?IBW/kg (Calculated) : 77.6 ?Heparin Dosing Weight: 98.1 kg ? ?Vital Signs: ?  ? ?Labs: ?Recent Labs  ?  07/23/21 ?0222 07/24/21 ?0158 07/25/21 ?0330  ?HGB 9.0* 9.3* 8.5*  ?HCT 28.7* 29.4* 27.6*  ?PLT 236 235 191  ?LABPROT  --  15.4* 19.3*  ?INR  --  1.2 1.6*  ?HEPARINUNFRC 0.36  --   --   ?CREATININE 1.29* 1.19 1.09  ? ? ? ?Estimated Creatinine Clearance: 78.5 mL/min (by C-G formula based on SCr of 1.09 mg/dL). ? ? ?Assessment: ?16 yom with a history of CAD, aortic stenosis, PAF, hypertension, hyperlipidemia, peripheral arterial disease s/p L BKA, chronic systolic heart failure with improved LVEF, diabetes mellitus and hx of DVT . Patient is presenting with chest pain. Heparin per pharmacy consult placed for atrial fibrillation. ? ?Patient is not on anticoagulation prior to arrival.  Patient had been on warfarin in the past but this was stopped at one time as risk was determined to be greater than benefit due to multiple admissions for anemia requiring blood cell transfusions. After further discussion with the patient, he has decided to proceed with long-term anticoagulation. As cost of DOACs are prohibitive, pharmacy has been consulted for warfarin re-initiation. Will target lower end of INR goal (2-2.5) due to history of normocytic anemia requiring warfarin discontinuation.  ? ?Patient transitioned to enoxaparin with no further anticipated procedures and need for bridging- patient should receive 5 days of parenteral bridging with 2x therapeutic INRs. ? ?INR subtherapeutic 3/31 AM at 1.6 s/p 2 doses of warfarin. No signs of bleeding noted per RN and patient. Hgb stable 8-9s, PLT WNL.  ? ?Previous home warfarin regimen: 7.5 m on M/W/F; 5 mg on all other days (noted from admission January 2023)  ?-  First dose of inpatient warfarin 3/29  ? ?Goal of Therapy:  ?Heparin level 0.3-0.7 units/ml ?INR 2-2.5  ?Monitor platelets by anticoagulation protocol: Yes ?  ?Plan:  ?Continue enoxaparin 150 mg [place] Q24H ?Warfarin 5 mg x1  ?Daily CBC, INR  ?Monitor for s/sx of bleeding ? ?Adria Dill, PharmD ?PGY-1 Acute Care Resident  ?07/25/2021 9:06 AM  ? ? ? ?  ?

## 2021-07-25 NOTE — Progress Notes (Signed)
Physical Therapy Treatment ?Patient Details ?Name: Rodney [name] ?MRN: [id number] ?DOB: 1950-09-29 ?Today's Date: 07/25/2021 ? ? ?History of Present Illness Pt is a 71 y/o M presenting to ED on 3/26 for respiratory distress secondary to PNA, COPD, and HFpEF exacerbation, COVID +, PMH includes CAD, CKD, HTN, hypercholesteremai, PVD, obesity, DM2, and L BKA. ? ?  ?PT Comments  ? ? Pt received supine and agreeable to bed level therex with good tolerance. Max encouragement for OOB to chair, sitting EOB, bed in chair position or repositioning in bed with education on benefits of mobility to lung and overall health, pt declining. Pt continues to benefit from skilled PT services to progress toward functional mobility goals.  ?  ?Recommendations for follow up therapy are one component of a multi-disciplinary discharge planning process, led by the attending physician.  Recommendations may be updated based on patient status, additional functional criteria and insurance authorization. ? ?Follow Up Recommendations ? Skilled nursing-short term rehab (<3 hours/day) ?  ?  ?Assistance Recommended at Discharge Frequent or constant Supervision/Assistance  ?Patient can return home with the following Help with stairs or ramp for entrance;Two people to help with walking and/or transfers;Assist for transportation;Two people to help with bathing/dressing/bathroom;Assistance with cooking/housework ?  ?Equipment Recommendations ? None recommended by PT  ?  ?Recommendations for Other Services   ? ? ?  ?Precautions / Restrictions Precautions ?Precautions: Fall ?Required Braces or Orthoses: Other Brace ?Restrictions ?Weight Bearing Restrictions: No  ?  ? ?Mobility ? Bed Mobility ?  ?  ?  ?  ?  ?  ?  ?General bed mobility comments: Pt refusing mobility ?  ? ?Transfers ?  ?  ?  ?  ?  ?  ?  ?  ?  ?General transfer comment: deferred, pt declining OOB, max encouragement, explained benefits of up to chair for lungs and healing ?   ? ?Ambulation/Gait ?  ?  ?  ?  ?  ?  ?  ?  ? ? ?Stairs ?  ?  ?  ?  ?  ? ? ?Wheelchair Mobility ?  ? ?Modified Rankin (Stroke Patients Only) ?  ? ? ?  ?Balance   ?  ?  ?  ?  ?  ?  ?  ?  ?  ?  ?  ?  ?  ?  ?  ?  ?  ?  ?  ? ?  ?Cognition Arousal/Alertness: Awake/alert ?Behavior During Therapy: Agitated ?Overall Cognitive Status: Within Functional Limits for tasks assessed ?  ?  ?  ?  ?  ?  ?  ?  ?  ?  ?  ?  ?  ?  ?  ?  ?  ?  ?  ? ?  ?Exercises General Exercises - Lower Extremity ?Ankle Circles/Pumps: AROM, Right, 10 reps, Supine ?Quad Sets: AROM, Right, Left, 10 reps, Supine ?Heel Slides: AROM, Right, 10 reps, Supine ?Hip ABduction/ADduction: AROM, Right, Left, 10 reps, Supine ?Straight Leg Raises: AROM, Right, Left, 10 reps, Supine ? ?  ?General Comments   ?  ?  ? ?Pertinent Vitals/Pain Pain Assessment ?Pain Assessment: Faces ?Faces Pain Scale: Hurts a little bit ?Pain Descriptors / Indicators: Discomfort ?Pain Intervention(s): Limited activity within patient's tolerance, Monitored during session  ? ? ?Home Living   ?  ?  ?  ?  ?  ?  ?  ?  ?  ?   ?  ?Prior Function    ?  ?  ?   ? ?  PT Goals (current goals can now be found in the care plan section) Acute Rehab PT Goals ?PT Goal Formulation: With patient ?Time For Goal Achievement: 07/09/21 ? ?  ?Frequency ? ? ? Min 3X/week ? ? ? ?  ?PT Plan Current plan remains appropriate  ? ? ?Co-evaluation   ?  ?  ?  ?  ? ?  ?AM-PAC PT "6 Clicks" Mobility   ?Outcome Measure ? Help needed turning from your back to your side while in a flat bed without using bedrails?: A Lot ?Help needed moving from lying on your back to sitting on the side of a flat bed without using bedrails?: Total ?Help needed moving to and from a bed to a chair (including a wheelchair)?: Total ?Help needed standing up from a chair using your arms (e.g., wheelchair or bedside chair)?: Total ?Help needed to walk in hospital room?: Total ?Help needed climbing 3-5 steps with a railing? : Total ?6 Click Score: 7 ? ?   ?End of Session Equipment Utilized During Treatment: Oxygen ?Activity Tolerance: Treatment limited secondary to agitation ?Patient left: in bed;with call bell/phone within reach ?Nurse Communication: Mobility status ?PT Visit Diagnosis: Muscle weakness (generalized) (M62.81);Other abnormalities of gait and mobility (R26.89) ?  ? ? ?Time: 8366-2947 ?PT Time Calculation (min) (ACUTE ONLY): 12 min ? ?Charges:  $Therapeutic Exercise: 8-22 mins          ?          ? ?Audry Riles. PTA ?Acute Rehabilitation Services ?Office: [phone] ? ? ?Betsey Holiday Chasiti Waddington ?07/25/2021, 2:06 PM ? ?

## 2021-07-26 DIAGNOSIS — I5033 Acute on chronic diastolic (congestive) heart failure: Secondary | ICD-10-CM | POA: Diagnosis not present

## 2021-07-26 DIAGNOSIS — J9601 Acute respiratory failure with hypoxia: Secondary | ICD-10-CM | POA: Diagnosis not present

## 2021-07-26 DIAGNOSIS — I4892 Unspecified atrial flutter: Secondary | ICD-10-CM | POA: Diagnosis not present

## 2021-07-26 DIAGNOSIS — J189 Pneumonia, unspecified organism: Secondary | ICD-10-CM | POA: Diagnosis not present

## 2021-07-26 LAB — BASIC METABOLIC PANEL
Anion gap: 7 (ref 5–15)
BUN: 24 mg/dL — ABNORMAL HIGH (ref 8–23)
CO2: 31 mmol/L (ref 22–32)
Calcium: 8.3 mg/dL — ABNORMAL LOW (ref 8.9–10.3)
Chloride: 99 mmol/L (ref 98–111)
Creatinine, Ser: 0.99 mg/dL (ref 0.61–1.24)
GFR, Estimated: >60 mL/min (ref >60)
Glucose, Bld: 48 mg/dL — ABNORMAL LOW (ref 70–99)
Potassium: 4 mmol/L (ref 3.5–5.1)
Sodium: 137 mmol/L (ref 135–145)

## 2021-07-26 LAB — CBC
HCT: 28 % — ABNORMAL LOW (ref 39.0–52.0)
Hemoglobin: 8.8 g/dL — ABNORMAL LOW (ref 13.0–17.0)
MCH: 29 pg (ref 26.0–34.0)
MCHC: 31.4 g/dL (ref 30.0–36.0)
MCV: 92.4 fL (ref 80.0–100.0)
Platelets: 179 10*3/uL (ref 150–400)
RBC: 3.03 MIL/uL — ABNORMAL LOW (ref 4.22–5.81)
RDW: 17.9 % — ABNORMAL HIGH (ref 11.5–15.5)
WBC: 6.2 10*3/uL (ref 4.0–10.5)
nRBC: 0 % (ref 0.0–0.2)

## 2021-07-26 LAB — GLUCOSE, CAPILLARY
Glucose-Capillary: 43 mg/dL — CL (ref 70–99)
Glucose-Capillary: 73 mg/dL (ref 70–99)
Glucose-Capillary: 89 mg/dL (ref 70–99)
Glucose-Capillary: 83 mg/dL (ref 70–99)

## 2021-07-26 LAB — PROTIME-INR
INR: 2.5 — ABNORMAL HIGH (ref 0.8–1.2)
Prothrombin Time: 26.9 seconds — ABNORMAL HIGH (ref 11.4–15.2)

## 2021-07-26 MED ORDER — DEXTROSE 50 % IV SOLN
INTRAVENOUS | Status: AC
  Filled 2021-07-26: qty 50

## 2021-07-26 MED ORDER — INSULIN DETEMIR 100 UNIT/ML FLEXPEN: 15.0000 [IU] | PEN_INJECTOR | Freq: Every day | SUBCUTANEOUS | 11 refills | Status: DC

## 2021-07-26 MED ORDER — WARFARIN SODIUM 5 MG PO TABS: 5.0000 mg | ORAL_TABLET | Freq: Every day | ORAL | Status: DC

## 2021-07-26 MED ORDER — INSULIN DETEMIR 100 UNIT/ML ~~LOC~~ SOLN
15.0000 [IU] | Freq: Every day | SUBCUTANEOUS | Status: DC
  Administered 2021-07-27 – 2021-07-29 (×3): 15 [IU] via SUBCUTANEOUS
  Filled 2021-07-26 (×3): qty 0.15

## 2021-07-26 MED ORDER — FUROSEMIDE 40 MG PO TABS: 40.0000 mg | ORAL_TABLET | Freq: Every day | ORAL | 0 refills | Status: DC

## 2021-07-26 NOTE — Progress Notes (Addendum)
Family Medicine Teaching Service ?Daily Progress Note ?Intern Pager: [phone] ? ?Patient name: Rodney [name] Medical record number: [id number] ?Date of birth: 1951-03-20 Age: 71 y.o. Gender: male ? ?Primary Care Provider: [name], MD ?Consultants: Cardiology ?Code Status: Full ? ?Pt Overview and Major Events to Date:  ?3/26- Admitted ? ?Assessment and Plan: ?Rodney [name] is a 71 y.o. male presenting with shortness of breath x1 week. PMH is significant for type 2 diabetes, obesity, tobacco abuse, hypertension, protein calorie malnutrition, aortic stenosis, anemia of chronic disease, PVD, HFrEF, PAF, s/p BKA, CAD, CKD stage III, COPD ?  ?Patient is medically clear for SNF placement ?  ?Acute Hypoxic Respiratory distress 2/2 PNA vs CHF exacerbation ?COPD ?HFpEF exacerbation ?COVID-19 positive ?No increased work of breathing. Satting 98% on 2L O2. RLE w/ 1+ pitting edema. 2.7L UOP ?- Consider weaning O2 to maintain 88-92% spO2 ?- Continue strict I's and O's ?- Daily weights ?- Continue home O2 requirement ?- Morning CBC/BMP ?- Patient has finished monnupiravir ?- Continue p.o. Lasix 40 mg daily ?- Continue Incruse plus Breo inhalers ?- Continue as needed albuterol and DuoNebs ?  ?PAF/ now aflutter ?INR 3. HR 70 ?-Coumadin per pharmacy consult ?-Continue Coreg 6.25 twice daily ?  ?Severe AS ?Blowing systolic murmur on exam.  ?-Cardiology following, appreciate recs ?-Will require TAVR workup outpatient ?  ?CKD stage IIIa, resolved ?-AM BMP ?-Avoid nephrotoxic agents ?  ?Normocytic Anemia, likely 2/2 CKD ?Hgb 9 ?-A.m. CBC ?-Continue home iron ?  ?T2DM, stable ?-Levemir to 15 units daily- will discharge on this ?- CBG every morning ?- Resume metformin ?  ?PAD s/p L BKA ?-Continue to monitor ?  ?Insomnia ?-Continue trazodone 50 mg qhs ?-Continue melatonin 5 mg qhs ?  ?FEN/GI: Heart Healthy ?PPx: Coumadin ?Dispo:SNF today ? ?Subjective:  ?Doing well no concerns. Denies increased work of  breathing ? ?Objective: ?Temp:  [97.6 ?F (36.4 ?C)-98 ?F (36.7 ?C)] 97.6 ?F (36.4 ?C) (04/01 2200) ?Pulse Rate:  [66-70] 70 (04/01 2200) ?Resp:  [17-18] 17 (04/01 2200) ?BP: (127-141)/(69-86) 137/69 (04/01 2200) ?SpO2:  [96 %-98 %] 98 % (04/01 2200) ?Weight:  [101 kg] 101 kg (04/01 0500) ?Physical Exam: ?General: Appears chronically ill, no acute distress. Age appropriate. ?Cardiac: RRR, blowing systolic murmur heard best at RSB ?Respiratory: CTAB, normal effort, [place] in  ?Abdomen: soft, nontender, nondistended ?Extremities: +1 pitting RLE edema  ?Neuro: alert and oriented ?Psych: normal affect ? ?Laboratory: ?Recent Labs  ?Lab 07/24/21 ?0158 07/25/21 ?0330 07/26/21 ?0259  ?WBC 8.8 6.4 6.2  ?HGB 9.3* 8.5* 8.8*  ?HCT 29.4* 27.6* 28.0*  ?PLT 235 191 179  ? ?Recent Labs  ?Lab 07/21/21 ?0215 07/22/21 ?0258 07/24/21 ?0158 07/25/21 ?0330 07/26/21 ?0259  ?NA 137   < > 138 137 137  ?K 4.3   < > 3.9 3.9 4.0  ?CL 103   < > 99 97* 99  ?CO2 29   < > 32 34* 31  ?BUN 38*   < > 36* 28* 24*  ?CREATININE 1.62*   < > 1.19 1.09 0.99  ?CALCIUM 8.2*   < > 8.6* 8.3* 8.3*  ?PROT 5.5*  --   --   --   --   ?BILITOT 1.0  --   --   --   --   ?ALKPHOS 71  --   --   --   --   ?ALT 30  --   --   --   --   ?AST 15  --   --   --   --   ?  GLUCOSE 208*   < > 55* 83 48*  ? < > = values in this interval not displayed.  ? ? ?Imaging/Diagnostic Tests: ?No new imaging ? ?Gerlene Fee, DO ?07/26/2021, 10:57 PM ?PGY-3, [place] ?[place] Intern pager: [phone], text pages welcome ? ?

## 2021-07-26 NOTE — Plan of Care (Signed)
?  Problem: Education: ?Goal: Knowledge of disease or condition will improve ?Outcome: Progressing ?Goal: Knowledge of the prescribed therapeutic regimen will improve ?Outcome: Progressing ?Goal: Individualized Educational Video(s) ?Outcome: Progressing ?  ?Problem: Activity: ?Goal: Will verbalize the importance of balancing activity with adequate rest periods ?Outcome: Progressing ?  ?Problem: Respiratory: ?Goal: Ability to maintain a clear airway will improve ?Outcome: Progressing ?Goal: Levels of oxygenation will improve ?Outcome: Progressing ?Goal: Ability to maintain adequate ventilation will improve ?Outcome: Progressing ?  ?Problem: Activity: ?Goal: Ability to tolerate increased activity will improve ?Outcome: Progressing ?  ?Problem: Respiratory: ?Goal: Ability to maintain adequate ventilation will improve ?Outcome: Progressing ?Goal: Ability to maintain a clear airway will improve ?Outcome: Progressing ?  ?Problem: Education: ?Goal: Knowledge of General Education information will improve ?Description: Including pain rating scale, medication(s)/side effects and non-pharmacologic comfort measures ?Outcome: Progressing ?  ?Problem: Health Behavior/Discharge Planning: ?Goal: Ability to manage health-related needs will improve ?Outcome: Progressing ?  ?Problem: Clinical Measurements: ?Goal: Ability to maintain clinical measurements within normal limits will improve ?Outcome: Progressing ?Goal: Will remain free from infection ?Outcome: Progressing ?Goal: Diagnostic test results will improve ?Outcome: Progressing ?Goal: Respiratory complications will improve ?Outcome: Progressing ?Goal: Cardiovascular complication will be avoided ?Outcome: Progressing ?  ?Problem: Activity: ?Goal: Risk for activity intolerance will decrease ?Outcome: Progressing ?  ?Problem: Nutrition: ?Goal: Adequate nutrition will be maintained ?Outcome: Progressing ?  ?Problem: Coping: ?Goal: Level of anxiety will decrease ?Outcome:  Progressing ?  ?Problem: Elimination: ?Goal: Will not experience complications related to bowel motility ?Outcome: Progressing ?Goal: Will not experience complications related to urinary retention ?Outcome: Progressing ?  ?Problem: Pain Managment: ?Goal: General experience of comfort will improve ?Outcome: Progressing ?  ?Problem: Safety: ?Goal: Ability to remain free from injury will improve ?Outcome: Progressing ?  ?Problem: Skin Integrity: ?Goal: Risk for impaired skin integrity will decrease ?Outcome: Progressing ?  ?

## 2021-07-26 NOTE — Progress Notes (Signed)
FPTS Brief Progress Note ? ?S: Lying in bed resting.  ? ? ?O: ?BP 127/86 (BP Location: Right Arm)   Pulse 70   Temp 98 ?F (36.7 ?C) (Oral)   Resp 18   Ht 6' (1.829 m)   Wt 101 kg   SpO2 98%   BMI 30.20 kg/m?   ?General: Appears well, no acute distress. Age appropriate. ?Respiratory: normal effort ? ?A/P: ?Acute Hypoxic Respiratory distress 2/2 PNA vs CHF exacerbation ?COPD ?HFpEF exacerbation ?COVID-19 positive ?- Orders reviewed. Labs for AM ordered, which was adjusted as needed.  ?- SNF 4/3 ? ?Gerlene Fee, DO ?07/26/2021, 9:21 PM ?PGY-3, [place] Medicine Night Resident  ?Please page [phone] with questions.  ? ? ?

## 2021-07-26 NOTE — TOC Progression Note (Signed)
Transition of Care (TOC) - Progression Note  ? ? ?Patient Details  ?Name: Rodney [name] ?MRN: [id number] ?Date of Birth: [birth year] ? ?Transition of Care (TOC) CM/SW Contact  ?Mirren Gest Renold Don, LCSWA ?Phone Number: ?07/26/2021, 11:56 AM ? ?Clinical Narrative:    ?CSW contacted [facility], receptionist stated that they will not take pt's over the weekend any longer as long as they are not LTC residents. Pt [name] is approved ref# [id number] pt will DC Monday.  ? ? ?Expected Discharge Plan: [place] ?Barriers to Discharge: Continued Medical Work up, Other (must enter comment), SNF Pending bed offer (covid positive test date 3/26) ? ?Expected Discharge Plan and Services ?Expected Discharge Plan: [place] ?In-house Referral: Clinical Social Work ?  ?Post Acute Care Choice: [place] ?Living arrangements for the past 2 months: [place] ?Expected Discharge Date: 07/26/21               ?  ?  ?  ?  ?  ?  ?  ?  ?  ?  ? ? ?Social Determinants of Health (SDOH) Interventions ?  ? ?Readmission Risk Interventions ? ?  06/26/2021  ?  4:36 PM 05/16/2021  ?  3:13 PM 03/26/2021  ?  1:31 PM  ?Readmission Risk Prevention Plan  ?Transportation Screening Complete Complete Complete  ?Medication Review Press photographer) Complete Complete Complete  ?PCP or Specialist appointment within 3-5 days of discharge Complete Complete   ?[place] or Home Care Consult Complete Complete   ?SW Recovery Care/Counseling Consult Complete Complete Complete  ?Palliative Care Screening Not Applicable Not Applicable Not Applicable  ?Skilled Nursing Facility Complete Not Applicable Not Applicable  ? ? ?

## 2021-07-26 NOTE — Discharge Summary (Deleted)
Family Medicine Teaching Service ?Hospital Discharge Summary ? ?Patient name: Rodney [name] Medical record number: [id number] ?Date of birth: [birth year] Age: 71 y.o. Gender: male ?Date of Admission: 07/20/2021  Date of Discharge: 07/26/2021 ? ?Admitting Physician: Lind Covert, MD ? ?Primary Care Provider: [name], MD ?Consultants: Cardiology ? ?Indication for Hospitalization: Shortness of breath ? ?Discharge Diagnoses/Problem List:  ?Pneumonia ?Acute CHF ?Acute hypoxic respiratory failure ?Atrial flutter ?Severe aortic stenosis ?Peripheral neuropathy ?Hypertension ?Hypercholesterolemia ?Type 2 diabetes ?Morbid obesity ?Peripheral vascular disease ?Anemia of chronic disease ?Paroxysmal atrial fibrillation ?CKD ? ?Disposition: SNF ? ?Discharge Condition: Stable, improved ? ?Discharge Exam: completed by Dr. Caron Presume ?General: 71 year old male, resting comfortably in no acute distress ?Cardiovascular: Systolic murmur appreciated, regular rate ?Respiratory: Diminished breath sounds in lower lung fields but no increased work of breathing on home O2 ?Abdomen: Soft, nontender ?Extremities: S/p left BKA, +1 right lower extremity edema with wound dressing on right lower extremity ? ?Brief Hospital Course:  ?Rodney [name] is a 71 y.o.male with a history of type 2 diabetes, obesity, tobacco abuse, hypertension, protein calorie malnutrition, aortic stenosis, anemia of chronic disease, PVD, HFrEF, PAF, s/p BKA, CAD, CKD stage III, COPD who was admitted to the family medicine teaching Service at [facility] for shortness of breath. His hospital course is detailed below: ? ?Acute hypoxic respiratory failure 2/2 pneumonia versus CHF exacerbation ?COPD ?HFrEF exacerbation ?COVID-19 positive ?Patient initially presented in respiratory distress requiring BiPAP.  He was started on DuoNebs as well as steroids and IV antibiotics.  His chest x-ray on arrival showed multilobar bronchopneumonia.  He was tested for COVID-19  and was found to be COVID-positive.  He started on remdesivir which was transitioned to molnupoiravir and completed prior to discharge. ? ?HFrEF/severe AS ?Patient has known HFrEF with recent echocardiogram 06/23/2021 showing ejection fraction of 55 to 60%.  Cardiology was consulted who recommended TEVAR work-up when he recovers from COVID-19. ? ?Paroxysmal A-fib with intermittent atrial flutter ?On arrival patient was in 2-1 atrial flutter.  He was started on amiodarone drip which was rate controlled and transitioned to carvedilol 6.125.  He was discharged this dose.  I recommended restarting Coumadin for outpatient anticoagulation for his history of DVT as well as this atrial fibrillation with a flutter.  Discharge recommendations from cardiology included atorvastatin 80 mg daily, carvedilol 6.25 mg twice daily, Plavix 75 mg daily, Lasix 40 mg daily, warfarin per pharmacy.  Spoke with pharmacy and patient was discharged on warfarin dose of 5 mg per pharmacy recommendations which was to be started on 4/2.  Cardiology has scheduled patient for follow-up with valve clinic. ? ?T2DM ?Patient's sugars well controlled throughout the hospitalization.  On the morning of discharge patient did have hypoglycemia of 43.  His home medication regiment was 25 units of Lantus but he reports his diet has decreased and he does not like the food in the hospital.  Given he is going to rehab facility will decrease Lantus dose to 15 units daily and will need to be titrated at SNF. ? ?Other chronic conditions were medically managed with home medications and formulary alternatives as necessary (normocytic anemia, CKD stage IIIa, T2DM, PAD, insomnia) ? ?PCP Follow-up Recommendations: ?1.  BMP in 1 week to reassess kidney function ?2.  Discharged on 5 mg warfarin daily to be restarted on 4/2. Needs close INR f/u ?3.  Patient scheduled for follow-up with valve clinic ?4.  Continue to encourage amputation of gangrenous toe although patient  continues to decline ?5.  Patient not discharged on amiodarone because of concern for cardioversion while he has not been on anticoagulation.  Needs to follow-up with cardiology regarding restarting this medication. ? ? ?Significant Procedures: None ? ?Significant Labs and Imaging:  ?Recent Labs  ?Lab 07/24/21 ?0158 07/25/21 ?0330 07/26/21 ?0259  ?WBC 8.8 6.4 6.2  ?HGB 9.3* 8.5* 8.8*  ?HCT 29.4* 27.6* 28.0*  ?PLT 235 191 179  ? ?Recent Labs  ?Lab 07/21/21 ?0215 07/22/21 ?0258 07/23/21 ?0222 07/24/21 ?0158 07/25/21 ?0330 07/26/21 ?0259  ?NA 137 137 136 138 137 137  ?K 4.3 4.4 4.3 3.9 3.9 4.0  ?CL 103 100 98 99 97* 99  ?CO2 '29 30 30 '$ 32 34* 31  ?GLUCOSE 208* 176* 181* 55* 83 48*  ?BUN 38* 43* 40* 36* 28* 24*  ?CREATININE 1.62* 1.49* 1.29* 1.19 1.09 0.99  ?CALCIUM 8.2* 8.3* 8.6* 8.6* 8.3* 8.3*  ?MG  --   --   --  1.8  --   --   ?ALKPHOS 71  --   --   --   --   --   ?AST 15  --   --   --   --   --   ?ALT 30  --   --   --   --   --   ?ALBUMIN 2.1*  --   --   --   --   --   ? ? ?DG Chest Port 1 View ? ?Result Date: 07/20/2021 ?CLINICAL DATA:  71 year old male with history of respiratory distress and shortness of breath. EXAM: PORTABLE CHEST 1 VIEW COMPARISON:  Chest x-ray 06/23/2021. FINDINGS: Lung volumes are low. Ill-defined opacities and areas of interstitial prominence are noted throughout the lungs bilaterally, most severe in the mid to lower lungs (left-greater-than-right). Moderate left pleural effusion, some of which is likely partially loculated within the left major fissure. Small right pleural effusion. No pneumothorax. Pulmonary vasculature and cardiac silhouette are obscured. Atherosclerotic calcifications in the thoracic aorta. IMPRESSION: 1. The appearance the chest is concerning for severe multilobar bilateral bronchopneumonia. 2. Chronic moderate left pleural effusion partially loculated in the left major fissure and small right pleural effusion. 3. Aortic atherosclerosis. Electronically Signed   By: Vinnie Langton M.D.   On: 07/20/2021 05:03   ? ? ?Results/Tests Pending at Time of Discharge: None ? ?Discharge Medications:  ?Allergies as of 07/26/2021   ?No Known Allergies ?  ? ?  ?Medication List  ?  ? ?STOP taking these medications   ? ?amiodarone 200 MG tablet ?Commonly known as: PACERONE ?  ? ?  ? ?TAKE these medications   ? ?acetaminophen 500 MG tablet ?Commonly known as: TYLENOL ?Take 1,000 mg by mouth every 6 (six) hours as needed for mild pain. ?  ?albuterol 108 (90 Base) MCG/ACT inhaler ?Commonly known as: VENTOLIN HFA ?INHALE 2 PUFFS INTO THE LUNGS EVERY 6 (SIX) HOURS AS NEEDED FOR WHEEZING OR SHORTNESS OF BREATH. ?  ?atorvastatin 80 MG tablet ?Commonly known as: LIPITOR ?TAKE 1 TABLET EVERY DAY ?  ?BD Pen Needle Nano U/F 32G X 4 MM Misc ?Generic drug: Insulin Pen Needle ?Use to inject insulin up to 4 times daily as directed, ?  ?carvedilol 12.5 MG tablet ?Commonly known as: COREG ?Take 6.25 mg by mouth 2 (two) times daily with a meal. ?  ?clopidogrel 75 MG tablet ?Commonly known as: PLAVIX ?TAKE 1 TABLET EVERY DAY WITH BREAKFAST ?What changed: See the new instructions. ?  ?FeroSul 325 (65 FE) MG tablet ?Generic drug:  ferrous sulfate ?Take 1 tablet (325 mg total) by mouth every other day. ?  ?fluticasone 50 MCG/ACT nasal spray ?Commonly known as: FLONASE ?Place 2 sprays into both nostrils daily. ?  ?furosemide 40 MG tablet ?Commonly known as: LASIX ?Take 1 tablet (40 mg total) by mouth daily. ?  ?gabapentin 300 MG capsule ?Commonly known as: NEURONTIN ?Take 600 mg by mouth at bedtime. ?  ?insulin detemir 100 UNIT/ML FlexPen ?Commonly known as: LEVEMIR ?Inject 15 Units into the skin daily. ?What changed: how much to take ?  ?ipratropium-albuterol 0.5-2.5 (3) MG/3ML Soln ?Commonly known as: DUONEB ?Take 3 mLs by nebulization every 4 (four) hours as needed. ?What changed: reasons to take this ?  ?lactose free nutrition Liqd ?Take 237 mLs by mouth 3 (three) times a week. ?  ?metFORMIN 1000 MG tablet ?Commonly  known as: GLUCOPHAGE ?Take 1 tablet (1,000 mg total) by mouth daily. ?  ?nicotine 7 mg/24hr patch ?Commonly known as: NICODERM CQ - dosed in mg/24 hr ?Place 1 patch (7 mg total) onto the skin daily. ?  ?pant

## 2021-07-26 NOTE — Progress Notes (Signed)
Patient blood glucose was 43. Patient is asymptomatic at this time. One cola and graham crackers was given to patient. MD made aware and D50 was ordered. Blood glucose rechecked prior to giving D50 and read 73. MD made aware of new results and said to hold D50. Will continue to monitor patient. ?

## 2021-07-26 NOTE — Progress Notes (Signed)
Family Medicine Teaching Service ?Daily Progress Note ?Intern Pager: [phone] ? ?Patient name: Rodney [name] Medical record number: [id number] ?Date of birth: June 26, 1950 Age: 71 y.o. Gender: [name] ? ?Primary Care Provider: [name], MD ?Consultants: Cardiology ?Code Status: Full ? ?Pt Overview and Major Events to Date:  ?3/26 admitted ? ?Assessment and Plan: ?Rodney [name] is a 71 y.o. [name] presenting with shortness of breath x1 week. PMH is significant for type 2 diabetes, obesity, tobacco abuse, hypertension, protein calorie malnutrition, aortic stenosis, anemia of chronic disease, PVD, HFrEF, PAF, s/p BKA, CAD, CKD stage III, COPD ? ?Patient is medically clear for SNF placement ? ?Acute Hypoxic Respiratory distress 2/2 PNA vs CHF exacerbation ?COPD ?HFpEF exacerbation ?COVID-19 positive ?Patient currently doing well on 2 L nasal cannula which is his baseline.  Not require any as needed albuterol yesterday.  1.45 L of urine output over the last 24 hours.  PT and OT continue to recommend SNF.  Patient has bed offer and plans to go to [facility]. ?- Continue strict I's and O's ?- Daily weights ?This continue home O2 requirement ?- Morning CBC/BMP ?- Patient has finished monnupiravir ?- Continue p.o. Lasix 40 mg daily ?- Continue Incruse plus Breo inhalers ?- Continue as needed albuterol and DuoNebs ? ?PAF/ now aflutter ?Currently in 3:1 aflutter. Bridged to lovenox from IV heparin with ultimate plan to start Coumadin for oral AC. Rate controlled at this time. ?-Coumadin per pharmacy consult ?-Continue Coreg 6.25 twice daily ? ?Severe AS ?Hospitalization day 6 with COVID+. Palliative to speak with patient regarding [place]. Cardiology recommends outpatient TAVR workup. Palliative saw patient and appears patient wants full scope. ?-Cardiology following, appreciate recs ?-Will require TAVR workup outpatient ? ?CKD stage IIIa, resolved ?Cr this morning of 0.99.  Better than baseline at this time.  ?-AM  BMP ?-Avoid nephrotoxic agents ? ?Normocytic Anemia, likely 2/2 CKD ?9.3>8.5>8.8.  Patient takes ferrous sulfate 325 mg daily.  Stable. ?-A.m. CBC ?-Continue home iron ? ?T2DM, stable ?Patient was hypoglycemic this morning at 43.  Asymptomatic.  Recheck after juice of 73.  Home medications include gabapentin 600 mg at bedtime, Levemir 25 units daily, metformin 1000 mg daily ?-Decreasing Levemir to 15 units daily which she will get for discharge. ?- CBG every morning ?- Resume metformin ? ?PAD s/p L BKA ?Gangrenous second toe appears dry. Refused amputation last admission. Stable otherwise.  ?-Continue to monitor ? ?Insomnia ?Previously been on trazodone 20 mg. ?-Continue trazodone 50 mg qhs ?-Continue melatonin 5 mg qhs ? ?FEN/GI: Heart Healthy ?PPx: Coumadin ?Dispo:SNF today ? ?Subjective:  ?Patient is doing well and in no acute distress.  Reports he slept well.  Denies any shortness of breath or chest pain. ? ?Objective: ?Temp:  [97.6 ?F (36.4 ?C)] 97.6 ?F (36.4 ?C) (03/31 2153) ?Pulse Rate:  [55-70] 55 (03/31 2153) ?Resp:  [16-18] 16 (03/31 2153) ?BP: (125-140)/(71-72) 125/72 (03/31 2153) ?SpO2:  [94 %-97 %] 97 % (03/31 2153) ?Weight:  [100.7 kg-101 kg] 101 kg (04/01 0500) ?Physical Exam: ?General: 71 year old [name], resting comfortably in no acute distress ?Cardiovascular: Systolic murmur appreciated, regular rate ?Respiratory: Diminished breath sounds in lower lung fields but no increased work of breathing on home O2 ?Abdomen: Soft, nontender ?Extremities: S/p left BKA, +1 right lower extremity edema with wound dressing on right lower extremity ? ?Laboratory: ?Recent Labs  ?Lab 07/24/21 ?0158 07/25/21 ?0330 07/26/21 ?0259  ?WBC 8.8 6.4 6.2  ?HGB 9.3* 8.5* 8.8*  ?HCT 29.4* 27.6* 28.0*  ?PLT 235 191  179  ? ?Recent Labs  ?Lab 07/21/21 ?0215 07/22/21 ?0258 07/24/21 ?0158 07/25/21 ?0330 07/26/21 ?0259  ?NA 137   < > 138 137 137  ?K 4.3   < > 3.9 3.9 4.0  ?CL 103   < > 99 97* 99  ?CO2 29   < > 32 34* 31  ?BUN Rodney*   <  > 36* 28* 24*  ?CREATININE 1.62*   < > 1.19 1.09 0.99  ?CALCIUM 8.2*   < > 8.6* 8.3* 8.3*  ?PROT 5.5*  --   --   --   --   ?BILITOT 1.0  --   --   --   --   ?ALKPHOS 71  --   --   --   --   ?ALT 30  --   --   --   --   ?AST 15  --   --   --   --   ?GLUCOSE 208*   < > 55* 83 48*  ? < > = values in this interval not displayed.  ? ? ? ? ?Imaging/Diagnostic Tests: ?No results found. ? ?Gifford Shave, MD ?07/26/2021, 6:46 AM ?PGY-3, [place] ?[place] Intern pager: [phone], text pages welcome ?

## 2021-07-26 NOTE — Hospital Course (Addendum)
Rodney [name] is a 71 y.o.male with a history of type 2 diabetes, obesity, tobacco abuse, hypertension, protein calorie malnutrition, aortic stenosis, anemia of chronic disease, PVD, HFrEF, PAF, s/p BKA, CAD, CKD stage III, COPD who was admitted to the family medicine teaching Service at [facility] for shortness of breath. His hospital course is detailed below: ? ?Acute hypoxic respiratory failure 2/2 pneumonia versus CHF exacerbation ?COPD ?HFrEF exacerbation ?COVID-19 positive ?Patient initially presented in respiratory distress requiring BiPAP.  He was started on DuoNebs as well as steroids and IV antibiotics.  His chest x-ray on arrival showed multilobar bronchopneumonia.  He was tested for COVID-19 and was found to be COVID-positive.  He started on remdesivir which was transitioned to molnupoiravir and completed prior to discharge. On discharge, doing well on his home oxygen requirement.  ? ?HFrEF/severe AS ?Patient has known HFrEF with recent echocardiogram 06/23/2021 showing ejection fraction of 55 to 60%.  Cardiology was consulted who recommended TEVAR work-up when he recovers from COVID-19. ? ?Paroxysmal A-fib with intermittent atrial flutter ?On arrival patient was in 2-1 atrial flutter.  He was started on amiodarone drip which was rate controlled and transitioned to carvedilol 6.125.  He was discharged this dose.  I recommended restarting Coumadin for outpatient anticoagulation for his history of DVT as well as this atrial fibrillation with a flutter.  Discharge recommendations from cardiology included atorvastatin 80 mg daily, carvedilol 6.25 mg twice daily, Plavix 75 mg daily, Lasix 40 mg daily, warfarin per pharmacy.  Spoke with pharmacy and patient was discharged on warfarin dose of 2.5 mg per pharmacy recommendations which was to be started on 4/3.  Cardiology has scheduled patient for follow-up with valve clinic. ? ?T2DM ?Patient's sugars well controlled throughout the hospitalization.  On the morning  of discharge patient did have hypoglycemia of 43.  His home medication regiment was 25 units of Lantus but he reports his diet has decreased and he does not like the food in the hospital.  Given he is going to rehab facility will decrease Lantus dose to 15 units daily and will need to be titrated outpatient.  ? ?Patient was originally supposed to go to a SNF after discharge. However, insurance declined peer to peer and authorization. Sent home with [place] and [id number], PT, OT.  ? ?Other chronic conditions were medically managed with home medications and formulary alternatives as necessary (normocytic anemia, CKD stage IIIa, T2DM, PAD, insomnia) ? ?PCP Follow-up Recommendations: ?1.  BMP in 1 week to reassess kidney function ?2.  Discharged on a decreased dose of 2.5 mg warfarin daily (given supratherapeutic levels) to be restarted on 07/29/21. Needs close INR f/u at coumadin clinic ?3.  Patient scheduled for follow-up with valve clinic ?4.  Continue to encourage amputation of gangrenous toe although patient continues to decline ?5.  Patient not discharged on amiodarone because of concern for cardioversion while he has not been on anticoagulation.  Needs to follow-up with cardiology regarding restarting this medication. ?

## 2021-07-26 NOTE — Progress Notes (Signed)
ANTICOAGULATION CONSULT NOTE  ? ?Pharmacy Consult for enoxaparin/warfarin  ?Indication: atrial fibrillation, hx DVT ? ?No Known Allergies ? ?Patient Measurements: ?Height: 6' (182.9 cm) ?Weight: 101 kg (222 lb 10.6 oz) ?IBW/kg (Calculated) : 77.6 ?Heparin Dosing Weight: 98.1 kg ? ?Vital Signs: ?Temp: 97.7 ?F (36.5 ?C) (04/01 0850) ?Temp Source: Oral (04/01 0850) ?BP: 141/77 (04/01 0850) ?Pulse Rate: 66 (04/01 0850) ? ?Labs: ?Recent Labs  ?  07/24/21 ?0158 07/25/21 ?0330 07/26/21 ?0259  ?HGB 9.3* 8.5* 8.8*  ?HCT 29.4* 27.6* 28.0*  ?PLT 235 191 179  ?LABPROT 15.4* 19.3* 26.9*  ?INR 1.2 1.6* 2.5*  ?CREATININE 1.19 1.09 0.99  ? ? ? ?Estimated Creatinine Clearance: 86.7 mL/min (by C-G formula based on SCr of 0.99 mg/dL). ? ? ?Assessment: ?53 yom with a history of CAD, aortic stenosis, PAF, hypertension, hyperlipidemia, peripheral arterial disease s/p L BKA, chronic systolic heart failure with improved LVEF, diabetes mellitus and hx of DVT . Patient is presenting with chest pain. Heparin per pharmacy consult placed for atrial fibrillation. ? ?Patient is not on anticoagulation prior to arrival.  Patient had been on warfarin in the past but this was stopped at one time as risk was determined to be greater than benefit due to multiple admissions for anemia requiring blood cell transfusions. After further discussion with the patient, he has decided to proceed with long-term anticoagulation. As cost of DOACs are prohibitive, pharmacy has been consulted for warfarin re-initiation. Will target lower end of INR goal (2-2.5) due to history of normocytic anemia requiring warfarin discontinuation.  ? ?INR therapeutic 4/1 AM at 2.5 s/p 3 doses of warfarin, however patient is not yet anticoagulated and should continue bridging while inpatient. Increase in INR from 1.6>2.5 likely reflective of 2 consecutive 7.5 mg doses, so will hold dose 4/1. No signs of bleeding noted. Hgb stable 8-9s, PLT WNL.  ? ?Previous home warfarin regimen: 7.5  m on M/W/F; 5 mg on all other days (noted from admission January 2023)  ?- First dose of inpatient warfarin 3/29  ? ?Goal of Therapy:  ?Heparin level 0.3-0.7 units/ml ?INR 2-2.5  ?Monitor platelets by anticoagulation protocol: Yes ?  ?Plan:  ?Continue enoxaparin 150 mg [place] Q24H- do not have to continue at discharge  ?Hold warfarin 4/1- at discharge would recommend 5 mg daily with close f/u in coumadin clinic  ?Daily CBC, INR  ?Monitor for s/sx of bleeding ? ?Adria Dill, PharmD ?PGY-1 Acute Care Resident  ?07/26/2021 11:01 AM  ? ? ? ?  ?

## 2021-07-27 DIAGNOSIS — I4892 Unspecified atrial flutter: Secondary | ICD-10-CM | POA: Diagnosis not present

## 2021-07-27 DIAGNOSIS — J189 Pneumonia, unspecified organism: Secondary | ICD-10-CM | POA: Diagnosis not present

## 2021-07-27 DIAGNOSIS — I5020 Unspecified systolic (congestive) heart failure: Secondary | ICD-10-CM

## 2021-07-27 DIAGNOSIS — I5033 Acute on chronic diastolic (congestive) heart failure: Secondary | ICD-10-CM | POA: Diagnosis not present

## 2021-07-27 LAB — BASIC METABOLIC PANEL
Anion gap: 7 (ref 5–15)
BUN: 18 mg/dL (ref 8–23)
CO2: 34 mmol/L — ABNORMAL HIGH (ref 22–32)
Calcium: 8.3 mg/dL — ABNORMAL LOW (ref 8.9–10.3)
Chloride: 97 mmol/L — ABNORMAL LOW (ref 98–111)
Creatinine, Ser: 1.01 mg/dL (ref 0.61–1.24)
GFR, Estimated: >60 mL/min (ref >60)
Glucose, Bld: 111 mg/dL — ABNORMAL HIGH (ref 70–99)
Potassium: 3.8 mmol/L (ref 3.5–5.1)
Sodium: 138 mmol/L (ref 135–145)

## 2021-07-27 LAB — CBC
HCT: 28.7 % — ABNORMAL LOW (ref 39.0–52.0)
Hemoglobin: 9.1 g/dL — ABNORMAL LOW (ref 13.0–17.0)
MCH: 29.1 pg (ref 26.0–34.0)
MCHC: 31.7 g/dL (ref 30.0–36.0)
MCV: 91.7 fL (ref 80.0–100.0)
Platelets: 183 10*3/uL (ref 150–400)
RBC: 3.13 MIL/uL — ABNORMAL LOW (ref 4.22–5.81)
RDW: 17.7 % — ABNORMAL HIGH (ref 11.5–15.5)
WBC: 6.2 10*3/uL (ref 4.0–10.5)
nRBC: 0 % (ref 0.0–0.2)

## 2021-07-27 LAB — GLUCOSE, CAPILLARY
Glucose-Capillary: 101 mg/dL — ABNORMAL HIGH (ref 70–99)
Glucose-Capillary: 126 mg/dL — ABNORMAL HIGH (ref 70–99)
Glucose-Capillary: 92 mg/dL (ref 70–99)

## 2021-07-27 LAB — PROTIME-INR
INR: 3.1 — ABNORMAL HIGH (ref 0.8–1.2)
Prothrombin Time: 31.8 seconds — ABNORMAL HIGH (ref 11.4–15.2)

## 2021-07-27 MED ORDER — WARFARIN 0.5 MG HALF TABLET
0.5000 mg | ORAL_TABLET | Freq: Once | ORAL | Status: AC
Start: 2021-07-27 — End: 2021-07-27
  Administered 2021-07-27: 0.5 mg via ORAL
  Filled 2021-07-27: qty 1

## 2021-07-27 NOTE — Plan of Care (Signed)

## 2021-07-27 NOTE — Progress Notes (Signed)
ANTICOAGULATION CONSULT NOTE  ? ?Pharmacy Consult for enoxaparin/warfarin  ?Indication: atrial fibrillation, hx DVT ? ?No Known Allergies ? ?Patient Measurements: ?Height: 6' (182.9 cm) ?Weight: 100.5 kg (221 lb 9 oz) ?IBW/kg (Calculated) : 77.6 ?Heparin Dosing Weight: 98.1 kg ? ?Vital Signs: ?Temp: 97.6 ?F (36.4 ?C) (04/01 2200) ?Temp Source: Oral (04/01 2200) ?BP: 137/69 (04/01 2200) ?Pulse Rate: 70 (04/01 2200) ? ?Labs: ?Recent Labs  ?  07/25/21 ?0330 07/26/21 ?0259 07/27/21 ?0250  ?HGB 8.5* 8.8* 9.1*  ?HCT 27.6* 28.0* 28.7*  ?PLT 191 179 183  ?LABPROT 19.3* 26.9* 31.8*  ?INR 1.6* 2.5* 3.1*  ?CREATININE 1.09 0.99 1.01  ? ? ? ?Estimated Creatinine Clearance: 84.7 mL/min (by C-G formula based on SCr of 1.01 mg/dL). ? ? ?Assessment: ?71 yom with a history of CAD, aortic stenosis, PAF, hypertension, hyperlipidemia, peripheral arterial disease s/p L BKA, chronic systolic heart failure with improved LVEF, diabetes mellitus and hx of DVT . Patient is presenting with chest pain. Heparin per pharmacy consult placed for atrial fibrillation. ? ?Patient is not on anticoagulation prior to arrival.  Patient had been on warfarin in the past but this was stopped at one time as risk was determined to be greater than benefit due to multiple admissions for anemia requiring blood cell transfusions. After further discussion with the patient, he has decided to proceed with long-term anticoagulation. As cost of DOACs are prohibitive, pharmacy has been consulted for warfarin re-initiation. Will target lower end of INR goal (2-2.5) due to history of normocytic anemia requiring warfarin discontinuation.  ? ?INR therapeutic 4/1 AM at 2.5 s/p 3 doses of warfarin, however patient is not yet anticoagulated and should continue bridging while inpatient. Increase in INR from 1.6>2.5>3.1 after held dose 4/1. Dosing thus far has been based on previous PTA regimen, however responsiveness of INR to these doses indicates patient likely needs  significant reduction to the total weekly dose, possibly due to his current nutritional status. Will give a significantly reduced dose on 4/2 so INR does not drop too much due to held dose on 4/1.  ? ?No signs of bleeding noted. Hgb stable 8-9s, PLT WNL.  ? ?Previous home warfarin regimen: 7.5 m on M/W/F; 5 mg on all other days (noted from admission January 2023)  ?- First dose of inpatient warfarin 3/29  ? ?Goal of Therapy:  ?Heparin level 0.3-0.7 units/ml ?INR 2-2.5  ?Monitor platelets by anticoagulation protocol: Yes ?  ?Plan:  ?Continue enoxaparin 150 mg [place] Q24H- do not have to continue at discharge  ?Warfarin 0.5 mg x1- at discharge would recommend 2.5 mg daily with close f/u in coumadin clinic  ?Daily CBC, INR  ?Monitor for s/sx of bleeding ? ?Adria Dill, PharmD ?PGY-1 Acute Care Resident  ?07/27/2021 8:31 AM  ? ? ? ?  ?

## 2021-07-27 NOTE — Plan of Care (Signed)
?  Problem: Clinical Measurements: ?Goal: Respiratory complications will improve ?Outcome: Progressing ?  ?Problem: Coping: ?Goal: Level of anxiety will decrease ?Outcome: Progressing ?  ?Problem: Elimination: ?Goal: Will not experience complications related to bowel motility ?Outcome: Progressing ?  ?Problem: Safety: ?Goal: Ability to remain free from injury will improve ?Outcome: Progressing ?  ?Problem: Skin Integrity: ?Goal: Risk for impaired skin integrity will decrease ?Outcome: Progressing ?  ?Problem: Nutrition: ?Goal: Adequate nutrition will be maintained ?Outcome: Not Progressing ?  ?

## 2021-07-28 ENCOUNTER — Ambulatory Visit: Payer: Medicare PPO | Admitting: Family Medicine

## 2021-07-28 LAB — PROTIME-INR
INR: 3.2 — ABNORMAL HIGH (ref 0.8–1.2)
Prothrombin Time: 32.6 seconds — ABNORMAL HIGH (ref 11.4–15.2)

## 2021-07-28 LAB — CBC
HCT: 28 % — ABNORMAL LOW (ref 39.0–52.0)
Hemoglobin: 8.6 g/dL — ABNORMAL LOW (ref 13.0–17.0)
MCH: 28.8 pg (ref 26.0–34.0)
MCHC: 30.7 g/dL (ref 30.0–36.0)
MCV: 93.6 fL (ref 80.0–100.0)
Platelets: 175 10*3/uL (ref 150–400)
RBC: 2.99 MIL/uL — ABNORMAL LOW (ref 4.22–5.81)
RDW: 17.3 % — ABNORMAL HIGH (ref 11.5–15.5)
WBC: 6.5 10*3/uL (ref 4.0–10.5)
nRBC: 0 % (ref 0.0–0.2)

## 2021-07-28 LAB — GLUCOSE, CAPILLARY: Glucose-Capillary: 91 mg/dL (ref 70–99)

## 2021-07-28 MED ORDER — MUPIROCIN 2 % EX OINT
1.0000 "application " | TOPICAL_OINTMENT | Freq: Two times a day (BID) | CUTANEOUS | Status: DC
  Administered 2021-07-28 – 2021-07-29 (×3): 1 via NASAL
  Filled 2021-07-28 (×2): qty 22

## 2021-07-28 MED ORDER — WARFARIN SODIUM 1 MG PO TABS
1.0000 mg | ORAL_TABLET | Freq: Once | ORAL | Status: AC
  Administered 2021-07-28: 1 mg via ORAL
  Filled 2021-07-28: qty 1

## 2021-07-28 MED ORDER — CHLORHEXIDINE GLUCONATE CLOTH 2 % EX PADS: 6.0000 | MEDICATED_PAD | Freq: Every day | CUTANEOUS | Status: DC

## 2021-07-28 NOTE — Plan of Care (Signed)

## 2021-07-28 NOTE — Progress Notes (Signed)
ANTICOAGULATION CONSULT NOTE  ? ?Pharmacy Consult for enoxaparin/warfarin  ?Indication: atrial fibrillation, hx DVT ? ?No Known Allergies ? ?Vital Signs: ?Temp: 98.5 ?F (36.9 ?C) (04/03 8563) ?Temp Source: Oral (04/03 1497) ?BP: 144/70 (04/03 0452) ?Pulse Rate: 74 (04/03 0452) ? ?Labs: ?Recent Labs  ?  07/26/21 ?0259 07/27/21 ?0250 07/28/21 ?0134  ?HGB 8.8* 9.1* 8.6*  ?HCT 28.0* 28.7* 28.0*  ?PLT 179 183 175  ?LABPROT 26.9* 31.8* 32.6*  ?INR 2.5* 3.1* 3.2*  ?CREATININE 0.99 1.01  --   ? ?Estimated Creatinine Clearance: 84.6 mL/min (by C-G formula based on SCr of 1.01 mg/dL). ? ?Assessment: ?66 yom with a PMH of CAD, PAF, peripheral arterial disease s/p L BKA, chronic systolic heart failure with improved LVEF, and hx of DVT presented with chest pain. No PTA AC. Previously on warfarin in past but was stopped due to anemia requiring blood cell transfusions. Patient agreed to long-term anticoagulation. As cost of DOACs are prohibitive, pharmacy has been consulted for warfarin on 3/29.   ? ?Previous home warfarin regimen: 7.5 m on M/W/F; 5 mg on all other days (noted from admission January 2023)  ? ?4/3 INR: 3.2, supratherapeutic  ?Last dose warfarin 0.'5mg'$  on 4/2. On therapeutic enoxaparin '150mg'$  for bridge. Nurse notes no bleeding. CBC stable.  ? ?Goal of Therapy:  ?INR 2-2.5 (due to hx of normocytic anemia requiring warfarin DC) ?Monitor platelets by anticoagulation protocol: Yes ?  ?Plan:  ?Stop enoxaparin today as appropriately bridged ?Warfarin 1 mg x 1 ?Discharge recommendation 2.5 mg daily with close f/u in coumadin clinic  ?Daily CBC, INR  ?Monitor for s/sx of bleeding ? ?Thank you for allowing pharmacy to participate in this patient's care. ? ?Levonne Spiller, PharmD ?PGY1 Acute Care Resident  ?07/28/2021,8:48 AM ? ? ? ? ? ? ?  ?

## 2021-07-28 NOTE — TOC Progression Note (Addendum)
Transition of Care (TOC) - Progression Note  ? ? ?Patient Details  ?Name: Rodney [name] ?MRN: [id number] ?Date of Birth: 24-Aug-1950 ? ?Transition of Care (TOC) CM/SW Contact  ?Joanne Chars, LCSW ?Phone Number: ?07/28/2021, 8:59 AM ? ?Clinical Narrative:   [name] still pending in [facility].  CSW had voicemail from [facility] stating they cannot approve due to pt declining to participate in PT.  CSW spoke to pt by phone and discussed this, pt states he will participate in PT today.  MD/PT updated. ? ?1000: pt did get up with PT and transfer to the chair.  New PT note sent to [facility]. ? ?4166: Phone call from Candace Cruise request has been sent for peer to peer review.  [phone] option 5.  Due by 4/4 at 1130am.  MD informed by secure chat.   ? ?Expected Discharge Plan: [place] ?Barriers to Discharge: Continued Medical Work up, Other (must enter comment), SNF Pending bed offer (covid positive test date 3/26) ? ?Expected Discharge Plan and Services ?Expected Discharge Plan: [place] ?In-house Referral: Clinical Social Work ?  ?Post Acute Care Choice: [place] ?Living arrangements for the past 2 months: [place] ?Expected Discharge Date: 07/26/21               ?  ?  ?  ?  ?  ?  ?  ?  ?  ?  ? ? ?Social Determinants of Health (SDOH) Interventions ?  ? ?Readmission Risk Interventions ? ?  06/26/2021  ?  4:36 PM 05/16/2021  ?  3:13 PM 03/26/2021  ?  1:31 PM  ?Readmission Risk Prevention Plan  ?Transportation Screening Complete Complete Complete  ?Medication Review Press photographer) Complete Complete Complete  ?PCP or Specialist appointment within 3-5 days of discharge Complete Complete   ?[place] or Home Care Consult Complete Complete   ?SW Recovery Care/Counseling Consult Complete Complete Complete  ?Palliative Care Screening Not Applicable Not Applicable Not Applicable  ?Skilled Nursing Facility Complete Not Applicable Not Applicable  ? ? ?

## 2021-07-28 NOTE — Progress Notes (Addendum)
Family Medicine Teaching Service ?Daily Progress Note ?Intern Pager: [phone] ? ?Patient name: Rodney [name] Medical record number: [id number] ?Date of birth: [birth year] Age: 71 y.o. Gender: male ? ?Primary Care Provider: [name], MD ?Consultants: Cardiology ?Code Status: Full ? ?Pt Overview and Major Events to Date:  ?3-26: Admitted to F PTS ? ?Assessment and Plan: ? ?RICCO DERSHEM is a 71 y.o. male presenting with shortness of breath x1 week. PMH is significant for type 2 diabetes, obesity, tobacco abuse, hypertension, protein calorie malnutrition, aortic stenosis, anemia of chronic disease, PVD, HFrEF, PAF, s/p BKA, CAD, CKD stage III, COPD.  ? ?Patient is medically clear for SNF placement ? ?Acute Hypoxic Respiratory distress 2/2 PNA vs CHF exacerbation ?COPD ?HFpEF exacerbation ?COVID-19 positive ?Patient is in good spirits this morning and doing well on room air.  He does require 2 L nasal cannula at baseline and has not received albuterol since 3/29.  PT and OT have recommended SNF.  However, on discussion with social work, he had declined mobility with physical therapy which has caused difficulty with authorization.  We will have PT see for imminent discharge.  He currently has a bed offer and plans to go to [facility], hopefully today. ?- Continue strict I's and O's ?- Daily weights ?- Continue home oxygen requirement, or room air ?- Continue p.o. Lasix 40 mg daily ?- Continue Incruse plus Breo inhalers ?- Continue as needed albuterol and DuoNebs ? ?PAF atrial flutter ?We have bridged to Lovenox from IV heparin, have started him on warfarin now at 2.5 mg, changing per pharmacy. ?-Coumadin per pharmacy consult ?- Continue Coreg 6.25 mg twice daily ? ?Severe atrial stenosis ?Needs TAVR work-up outpatient ? ?CKD stage IIIa, resolved ?No updated BMP this a.m.  Awaiting discharge.  Avoid nephrotoxic agents. ? ?Normocytic anemia, likely secondary to CKD  ?hemoglobin this a.m. 8.6 from 8.8.   Ferrous sulfate taken 325 mg daily, stable. ?- Continue home iron ? ?Type 2 diabetes, stable  ?CBG this a.m. 91. We have decreased his Levemir to 15 units daily which is his home dosage.  He appears to be tolerating this well in addition to his metformin.  Continue regimen at discharge. ? ?PAD status post left BKA ?Patient has refused amputation in the past, otherwise stable. ? ?Insomnia ?Continued with trazodone 50 Mg nightly and melatonin 5 MG nightly during admission. ? ?FEN/GI: Heart healthy ?PPx: Coumadin ?Dispo: Patient is medically stable for SNF, however difficulty with prior authorization.  Hopeful that he will go to [facility] today. ? ?Subjective:  ?Patient expresses that he wants to go to the nursing facility today without any other complaints. ? ?Objective: ?Temp:  [98.4 ?F (36.9 ?C)-98.9 ?F (37.2 ?C)] 98.5 ?F (36.9 ?C) (04/03 5809) ?Pulse Rate:  [68-74] 68 (04/03 0852) ?Resp:  [16-19] 16 (04/03 9833) ?BP: (128-144)/(68-78) 143/68 (04/03 8250) ?SpO2:  [96 %-99 %] 99 % (04/03 0852) ?Weight:  [100.2 kg] 100.2 kg (04/03 0448) ? ?General: Alert and oriented in no apparent distress, pleasant elderly man ?Heart: Regular rate and rhythm with no murmurs appreciated ?Lungs: CTA bilaterally, no wheezing ?Abdomen: Bowel sounds present, no abdominal pain ?Skin: Warm and dry ? ?Laboratory: ?Recent Labs  ?Lab 07/26/21 ?0259 07/27/21 ?0250 07/28/21 ?0134  ?WBC 6.2 6.2 6.5  ?HGB 8.8* 9.1* 8.6*  ?HCT 28.0* 28.7* 28.0*  ?PLT 179 183 175  ? ?Recent Labs  ?Lab 07/25/21 ?0330 07/26/21 ?0259 07/27/21 ?0250  ?NA 137 137 138  ?K 3.9 4.0 3.8  ?CL  97* 99 97*  ?CO2 34* 31 34*  ?BUN 28* 24* 18  ?CREATININE 1.09 0.99 1.01  ?CALCIUM 8.3* 8.3* 8.3*  ?GLUCOSE 83 48* 111*  ? ? ? ? ?Erskine Emery, MD ?07/28/2021, 9:14 AM ?PGY-1, [place] [facility] ?[place] Intern pager: [phone], text pages welcome ? ?

## 2021-07-28 NOTE — Discharge Summary (Deleted)
Family Medicine Teaching Service ?Hospital Discharge Summary ? ?Patient name: Rodney [name] Medical record number: [id number] ?Date of birth: [birth year] Age: 71 y.o. Gender: male ?Date of Admission: 07/20/2021  Date of Discharge: 07/28/21 ?Admitting Physician: Lind Covert, MD ? ?Primary Care Provider: [name], MD ?Consultants: cardiology ? ?Indication for Hospitalization: Shortness of breath  ? ?Discharge Diagnoses/Problem List:  ?Principal Problem: ?  Pneumonia ?Active Problems: ?  Severe aortic stenosis ?  Atrial flutter ([place]) ?  Acute respiratory failure with hypoxia ([place]) ?  Acute on chronic diastolic heart failure ([place]) ?Severe aortic stenosis  ?Peripheral neuropathy  ?HTN  ?Hypercholesterolemia  ?T2DM  ?Morbid obesity  ?PVD ?Anemia of chronic disease  ?pAFib  ?CKD  ? ? ?Disposition: SNF  ? ?Discharge Condition: Stable ? ?Discharge Exam: Blood pressure (!) 143/68, pulse 68, temperature 98.5 ?F (36.9 ?C), temperature source Oral, resp. rate 16, height 6' (1.829 m), weight 100.2 kg, SpO2 99 %. ?General: Alert and oriented in no apparent distress, pleasant this AM  ?Heart: Regular rate and rhythm with no murmurs appreciated ?Lungs: CTA bilaterally, no wheezing ?Abdomen: Bowel sounds present, no abdominal pain ?Skin: Warm and dry ? ?Brief Hospital Course:  ?Rodney [name] is a 71 y.o.male with a history of type 2 diabetes, obesity, tobacco abuse, hypertension, protein calorie malnutrition, aortic stenosis, anemia of chronic disease, PVD, HFrEF, PAF, s/p BKA, CAD, CKD stage III, COPD who was admitted to the family medicine teaching Service at [facility] for shortness of breath. His hospital course is detailed below: ? ?Acute hypoxic respiratory failure 2/2 pneumonia versus CHF exacerbation ?COPD ?HFrEF exacerbation ?COVID-19 positive ?Patient initially presented in respiratory distress requiring BiPAP.  He was started on DuoNebs as well as steroids and IV antibiotics.  His chest x-ray on arrival  showed multilobar bronchopneumonia.  He was tested for COVID-19 and was found to be COVID-positive.  He started on remdesivir which was transitioned to molnupoiravir and completed prior to discharge. On discharge, doing well on RA.  ? ?HFrEF/severe AS ?Patient has known HFrEF with recent echocardiogram 06/23/2021 showing ejection fraction of 55 to 60%.  Cardiology was consulted who recommended TEVAR work-up when he recovers from COVID-19. ? ?Paroxysmal A-fib with intermittent atrial flutter ?On arrival patient was in 2-1 atrial flutter.  He was started on amiodarone drip which was rate controlled and transitioned to carvedilol 6.125.  He was discharged this dose.  I recommended restarting Coumadin for outpatient anticoagulation for his history of DVT as well as this atrial fibrillation with a flutter.  Discharge recommendations from cardiology included atorvastatin 80 mg daily, carvedilol 6.25 mg twice daily, Plavix 75 mg daily, Lasix 40 mg daily, warfarin per pharmacy.  Spoke with pharmacy and patient was discharged on warfarin dose of 2.5 mg per pharmacy recommendations which was to be started on 4/3.  Cardiology has scheduled patient for follow-up with valve clinic. ? ?T2DM ?Patient's sugars well controlled throughout the hospitalization.  On the morning of discharge patient did have hypoglycemia of 43.  His home medication regiment was 25 units of Lantus but he reports his diet has decreased and he does not like the food in the hospital.  Given he is going to rehab facility will decrease Lantus dose to 15 units daily and will need to be titrated at SNF. ? ?Other chronic conditions were medically managed with home medications and formulary alternatives as necessary (normocytic anemia, CKD stage IIIa, T2DM, PAD, insomnia) ? ?PCP Follow-up Recommendations: ?1.  BMP in 1 week to reassess  kidney function ?2.  Discharged on a decreased dose of 2.5 mg warfarin daily (given supratherapeutic levels) to be restarted on  07/28/21. Needs close INR f/u at coumadin clinic ?3.  Patient scheduled for follow-up with valve clinic ?4.  Continue to encourage amputation of gangrenous toe although patient continues to decline ?5.  Patient not discharged on amiodarone because of concern for cardioversion while he has not been on anticoagulation.  Needs to follow-up with cardiology regarding restarting this medication. ? ? ? ?Significant Procedures: N/A ? ?Significant Labs and Imaging:  ?Recent Labs  ?Lab 07/26/21 ?0259 07/27/21 ?0250 07/28/21 ?0134  ?WBC 6.2 6.2 6.5  ?HGB 8.8* 9.1* 8.6*  ?HCT 28.0* 28.7* 28.0*  ?PLT 179 183 175  ? ?Recent Labs  ?Lab 07/23/21 ?0222 07/24/21 ?0158 07/25/21 ?0330 07/26/21 ?0259 07/27/21 ?0250  ?NA 136 138 137 137 138  ?K 4.3 3.9 3.9 4.0 3.8  ?CL 98 99 97* 99 97*  ?CO2 30 32 34* 31 34*  ?GLUCOSE 181* 55* 83 48* 111*  ?BUN 40* 36* 28* 24* 18  ?CREATININE 1.29* 1.19 1.09 0.99 1.01  ?CALCIUM 8.6* 8.6* 8.3* 8.3* 8.3*  ?MG  --  1.8  --   --   --   ? ? ? ?DG Chest Port 1 View ? ?Result Date: 07/20/2021 ?CLINICAL DATA:  71 year old male with history of respiratory distress and shortness of breath. EXAM: PORTABLE CHEST 1 VIEW COMPARISON:  Chest x-ray 06/23/2021. FINDINGS: Lung volumes are low. Ill-defined opacities and areas of interstitial prominence are noted throughout the lungs bilaterally, most severe in the mid to lower lungs (left-greater-than-right). Moderate left pleural effusion, some of which is likely partially loculated within the left major fissure. Small right pleural effusion. No pneumothorax. Pulmonary vasculature and cardiac silhouette are obscured. Atherosclerotic calcifications in the thoracic aorta. IMPRESSION: 1. The appearance the chest is concerning for severe multilobar bilateral bronchopneumonia. 2. Chronic moderate left pleural effusion partially loculated in the left major fissure and small right pleural effusion. 3. Aortic atherosclerosis. Electronically Signed   By: Vinnie Langton M.D.   On:  07/20/2021 05:03  ? ?Results/Tests Pending at Time of Discharge: none  ? ?Discharge Medications:  ?Allergies as of 07/28/2021   ?No Known Allergies ?  ? ?  ?Medication List  ?  ? ?STOP taking these medications   ? ?amiodarone 200 MG tablet ?Commonly known as: PACERONE ?  ? ?  ? ?TAKE these medications   ? ?acetaminophen 500 MG tablet ?Commonly known as: TYLENOL ?Take 1,000 mg by mouth every 6 (six) hours as needed for mild pain. ?  ?albuterol 108 (90 Base) MCG/ACT inhaler ?Commonly known as: VENTOLIN HFA ?INHALE 2 PUFFS INTO THE LUNGS EVERY 6 (SIX) HOURS AS NEEDED FOR WHEEZING OR SHORTNESS OF BREATH. ?  ?atorvastatin 80 MG tablet ?Commonly known as: LIPITOR ?TAKE 1 TABLET EVERY DAY ?  ?BD Pen Needle Nano U/F 32G X 4 MM Misc ?Generic drug: Insulin Pen Needle ?Use to inject insulin up to 4 times daily as directed, ?  ?carvedilol 12.5 MG tablet ?Commonly known as: COREG ?Take 6.25 mg by mouth 2 (two) times daily with a meal. ?  ?clopidogrel 75 MG tablet ?Commonly known as: PLAVIX ?TAKE 1 TABLET EVERY DAY WITH BREAKFAST ?What changed: See the new instructions. ?  ?FeroSul 325 (65 FE) MG tablet ?Generic drug: ferrous sulfate ?Take 1 tablet (325 mg total) by mouth every other day. ?  ?fluticasone 50 MCG/ACT nasal spray ?Commonly known as: FLONASE ?Place 2 sprays into both  nostrils daily. ?  ?furosemide 40 MG tablet ?Commonly known as: LASIX ?Take 1 tablet (40 mg total) by mouth daily. ?  ?gabapentin 300 MG capsule ?Commonly known as: NEURONTIN ?Take 600 mg by mouth at bedtime. ?  ?insulin detemir 100 UNIT/ML FlexPen ?Commonly known as: LEVEMIR ?Inject 15 Units into the skin daily. ?What changed: how much to take ?  ?ipratropium-albuterol 0.5-2.5 (3) MG/3ML Soln ?Commonly known as: DUONEB ?Take 3 mLs by nebulization every 4 (four) hours as needed. ?What changed: reasons to take this ?  ?lactose free nutrition Liqd ?Take 237 mLs by mouth 3 (three) times a week. ?  ?metFORMIN 1000 MG tablet ?Commonly known as: GLUCOPHAGE ?Take  1 tablet (1,000 mg total) by mouth daily. ?  ?nicotine 7 mg/24hr patch ?Commonly known as: NICODERM CQ - dosed in mg/24 hr ?Place 1 patch (7 mg total) onto the skin daily. ?  ?pantoprazole 40 MG tablet ?Commo

## 2021-07-28 NOTE — Progress Notes (Signed)
Physical Therapy Treatment ?Patient Details ?Name: Rodney [name] ?MRN: [id number] ?DOB: January 07, 1951 ?Today's Date: 07/28/2021 ? ? ?History of Present Illness Pt is a 71 y/o M presenting to ED on 3/26 for respiratory distress secondary to PNA, COPD, and HFpEF exacerbation, COVID +, PMH includes CAD, CKD, HTN, hypercholesteremai, PVD, obesity, DM2, and L BKA. ? ?  ?PT Comments  ? ? Pt received supine and agreeable to session requiring grossly min guard and no physical assist to come to sitting EOB. Pt needing light cues for use of bed rail to assist in rolling and to elevate trunk. Pt with good tolerance for sitting EOB with no c/o dizziness or nausea and ability to sit without UE support with no LOB. Pt requiring no physical assist to lateral scoot transfer from EOB to recliner, with recliner set up and close guard for safety. Pt with fair tolerance for seated therex, DOE resolving within 1-2 mins of seated recovery between exercises. Pt agreeable to sitting up in chair at end of session. Current plan remains appropriate to address deficits and maximize functional independence and decrease caregiver burden. Pt continues to benefit from skilled PT services to progress toward functional mobility goals.  ?  ?Recommendations for follow up therapy are one component of a multi-disciplinary discharge planning process, led by the attending physician.  Recommendations may be updated based on patient status, additional functional criteria and insurance authorization. ? ?Follow Up Recommendations ? Skilled nursing-short term rehab (<3 hours/day) ?  ?  ?Assistance Recommended at Discharge Frequent or constant Supervision/Assistance  ?Patient can return home with the following Help with stairs or ramp for entrance;Two people to help with walking and/or transfers;Assist for transportation;Two people to help with bathing/dressing/bathroom;Assistance with cooking/housework ?  ?Equipment Recommendations ? None recommended by PT  ?   ?Recommendations for Other Services   ? ? ?  ?Precautions / Restrictions Precautions ?Precautions: Fall ?Required Braces or Orthoses: Other Brace ?Restrictions ?Weight Bearing Restrictions: No  ?  ? ?Mobility ? Bed Mobility ?Overal bed mobility: Needs Assistance ?Bed Mobility: Supine to Sit ?Rolling: Min assist, Min guard ?Sidelying to sit: Min assist, Min guard ?  ?  ?  ?General bed mobility comments: min a for cues for reaching for rail for rolling and to elevate trunk, no physical assist needed to comet o sitting EOB ?  ? ?Transfers ?Overall transfer level: Needs assistance ?Equipment used: None ?Transfers: Bed to chair/wheelchair/BSC ?  ?  ?  ?  ?  ? Lateral/Scoot Transfers: Min guard ?General transfer comment: pt able to lateral scoot from bed to chair with min guard for safety. ?  ? ?Ambulation/Gait ?  ?  ?  ?  ?  ?  ?  ?  ? ? ?Stairs ?  ?  ?  ?  ?  ? ? ?Wheelchair Mobility ?  ? ?Modified Rankin (Stroke Patients Only) ?  ? ? ?  ?Balance Overall balance assessment: Needs assistance ?Sitting-balance support: Bilateral upper extremity supported, Feet unsupported ?Sitting balance-Leahy Scale: Good ?Sitting balance - Comments: pt able to sit EOB with no LOB, no c/o dizziness or nausea ?  ?  ?  ?  ?  ?  ?  ?  ?  ?  ?  ?  ?  ?  ?  ?  ? ?  ?Cognition Arousal/Alertness: Awake/alert ?Behavior During Therapy: Agitated, WFL for tasks assessed/performed ?Overall Cognitive Status: Within Functional Limits for tasks assessed ?  ?  ?  ?  ?  ?  ?  ?  ?  ?  ?  ?  ?  ?  ?  ?  ?  ?  ?  ? ?  ?  Exercises General Exercises - Lower Extremity ?Long Arc Quad: AROM, Right, Left, 10 reps, Seated ?Hip ABduction/ADduction: AROM, 10 reps, Both, Seated ?Hip Flexion/Marching: AROM, Right, Left, 20 reps, Seated ?Other Exercises ?Other Exercises: chair push ups x10 ? ?  ?General Comments   ?  ?  ? ?Pertinent Vitals/Pain Pain Assessment ?Pain Assessment: No/denies pain  ? ? ?Home Living   ?  ?  ?  ?  ?  ?  ?  ?  ?  ?   ?  ?Prior Function    ?  ?   ?   ? ?PT Goals (current goals can now be found in the care plan section) Acute Rehab PT Goals ?PT Goal Formulation: With patient ?Time For Goal Achievement: 07/09/21 ? ?  ?Frequency ? ? ? Min 3X/week ? ? ? ?  ?PT Plan Current plan remains appropriate  ? ? ?Co-evaluation   ?  ?  ?  ?  ? ?  ?AM-PAC PT "6 Clicks" Mobility   ?Outcome Measure ? Help needed turning from your back to your side while in a flat bed without using bedrails?: A Little ?Help needed moving from lying on your back to sitting on the side of a flat bed without using bedrails?: A Little ?Help needed moving to and from a bed to a chair (including a wheelchair)?: A Little ?Help needed standing up from a chair using your arms (e.g., wheelchair or bedside chair)?: Total ?Help needed to walk in hospital room?: Total ?Help needed climbing 3-5 steps with a railing? : Total ?6 Click Score: 12 ? ?  ?End of Session Equipment Utilized During Treatment: Oxygen ?Activity Tolerance: Patient tolerated treatment well ?Patient left: in chair;with call bell/phone within reach ?Nurse Communication: Mobility status ?PT Visit Diagnosis: Muscle weakness (generalized) (M62.81);Other abnormalities of gait and mobility (R26.89) ?  ? ? ?Time: 0938-1829 ?PT Time Calculation (min) (ACUTE ONLY): 26 min ? ?Charges:  $Therapeutic Exercise: 8-22 mins ?$Therapeutic Activity: 8-22 mins          ?          ? ?Audry Riles. PTA ?Acute Rehabilitation Services ?Office: [phone] ? ? ? ?Betsey Holiday Thalia Turkington ?07/28/2021, 9:46 AM ? ?

## 2021-07-29 ENCOUNTER — Other Ambulatory Visit (HOSPITAL_COMMUNITY): Payer: Self-pay

## 2021-07-29 LAB — CBC
HCT: 28 % — ABNORMAL LOW (ref 39.0–52.0)
Hemoglobin: 8.7 g/dL — ABNORMAL LOW (ref 13.0–17.0)
MCH: 28.7 pg (ref 26.0–34.0)
MCHC: 31.1 g/dL (ref 30.0–36.0)
MCV: 92.4 fL (ref 80.0–100.0)
Platelets: 162 10*3/uL (ref 150–400)
RBC: 3.03 MIL/uL — ABNORMAL LOW (ref 4.22–5.81)
RDW: 17.2 % — ABNORMAL HIGH (ref 11.5–15.5)
WBC: 6.3 10*3/uL (ref 4.0–10.5)
nRBC: 0 % (ref 0.0–0.2)

## 2021-07-29 LAB — PROTIME-INR
INR: 2.7 — ABNORMAL HIGH (ref 0.8–1.2)
Prothrombin Time: 28.7 seconds — ABNORMAL HIGH (ref 11.4–15.2)

## 2021-07-29 MED ORDER — WARFARIN SODIUM 2.5 MG PO TABS
2.5000 mg | ORAL_TABLET | Freq: Once | ORAL | Status: AC
  Administered 2021-07-29: 2.5 mg via ORAL
  Filled 2021-07-29: qty 1

## 2021-07-29 MED ORDER — WARFARIN SODIUM 2.5 MG PO TABS
2.5000 mg | ORAL_TABLET | Freq: Every day | ORAL | 0 refills | Status: DC
  Filled 2021-07-29: qty 30, 30d supply, fill #0

## 2021-07-29 NOTE — Progress Notes (Signed)
PT Cancellation Note ? ?Patient Details ?Name: Rodney [name] ?MRN: [id number] ?DOB: 1951/01/23 ? ? ?Cancelled Treatment:    Reason Eval/Treat Not Completed: Other (comment) Pt dressed; sitting on edge of bed and awaiting his ride. Reports no further questions/concerns regarding transferring out of bed and mobilizing. Agreeable to HHPT upon discharge. ? ?Wyona Almas, PT, DPT ?Acute Rehabilitation Services ?Pager [phone] ?Office [phone] ? ? ? ?Deno Etienne ?07/29/2021, 1:02 PM ?

## 2021-07-29 NOTE — Progress Notes (Signed)
4/4 IM Letter mailed to patient's mailing address due to patient's isolation status. ?

## 2021-07-29 NOTE — TOC Progression Note (Signed)
Transition of Care (TOC) - Progression Note  ? ? ?Patient Details  ?Name: Rodney [name] ?MRN: [id number] ?Date of Birth: 05-17-1950 ? ?Transition of Care (TOC) CM/SW Contact  ?Sharin Mons, RN ?Phone Number: ?07/29/2021, 10:15 AM ? ?Clinical Narrative:    ?Pt declined for SNF placement. Pt states agreeable to home health services. States girlfriend to assist and care for him once d/c. Pt without preference for home health agency. Referral made with [facility] and accepted, pending MD's order. ?Pt without DME needs. States no problems affording Rx meds. ?Pt states girlfriend will provide transportation to home. ?[facility])       ?[phone]     ? ?TOC team following and will assist with needs... ?Expected Discharge Plan: [place] ?Barriers to Discharge: No Barriers Identified ? ?Expected Discharge Plan and Services ?Expected Discharge Plan: [place] ?In-house Referral: Clinical Social Work ?  ?Post Acute Care Choice: [place] ?Living arrangements for the past 2 months: [place] ?Expected Discharge Date: 07/26/21               ?  ?  ?  ?  ?  ?HH Arranged: RN, PT, OT ?[place] Agency: [facility] ?Date HH Agency Contacted: 07/29/21 ?Time [place]: 2500 ?Representative spoke with at [place]: Tommi Rumps ? ? ?Social Determinants of Health (SDOH) Interventions ?  ? ?Readmission Risk Interventions ? ?  06/26/2021  ?  4:36 PM 05/16/2021  ?  3:13 PM 03/26/2021  ?  1:31 PM  ?Readmission Risk Prevention Plan  ?Transportation Screening Complete Complete Complete  ?Medication Review Press photographer) Complete Complete Complete  ?PCP or Specialist appointment within 3-5 days of discharge Complete Complete   ?[place] or Home Care Consult Complete Complete   ?SW Recovery Care/Counseling Consult Complete Complete Complete  ?Palliative Care Screening Not Applicable Not Applicable Not Applicable  ?Skilled Nursing Facility Complete Not  Applicable Not Applicable  ? ? ?

## 2021-07-29 NOTE — Care Management Important Message (Signed)
Important Message ? ?Patient Details  ?Name: Rodney [name] ?MRN: [id number] ?Date of Birth: Jul 22, 1950 ? ? ?Medicare Important Message Given:  Other (see comment) ? ? ? ? ?Levada Dy  Janavia Rottman-Martin ?07/29/2021, 8:50 AM ?

## 2021-07-29 NOTE — Plan of Care (Signed)

## 2021-07-29 NOTE — Progress Notes (Signed)
Family Medicine Teaching Service ?Daily Progress Note ?Intern Pager: [phone] ? ?Patient name: Rodney [name] Medical record number: [id number] ?Date of birth: June 04, 1950 Age: 71 y.o. Gender: male ? ?Primary Care Provider: [name], MD ?Consultants: Cardiology  ?Code Status: FULL  ? ?Pt Overview and Major Events to Date:  ?3/26: Admitted to [facility]  ? ?Assessment and Plan: ? ?Rodney [name] is a 71 y.o. male presenting with shortness of breath x1 week. PMH is significant for type 2 diabetes, obesity, tobacco abuse, hypertension, protein calorie malnutrition, aortic stenosis, anemia of chronic disease, PVD, HFrEF, PAF, s/p BKA, CAD, CKD stage III, COPD.  ?  ?Patient is medically clear for discharge ?  ?Acute Hypoxic Respiratory Distress 2/2 PNA vs CHF exacerbation  ?COPD  ?HFpEF Exacerbation  ?COVID-19 positive  ?Patient is doing well this morning on 2L  at baseline.  PT and OT recommended SNF placement.  However, on peer to peer with insurance, the placement was denied.  Patient will likely need to go home with assistance and requires assistance with mobility continuously throughout the day. ?- Try to set up home assistance ?- Continue home oxygen requirement ?- Continue p.o. Lasix 40 ?- Increase with Breo inhalers ?- Albuterol and DuoNebs ? ?PAF atrial flutter ?Patient will be started on warfarin 2.5 mg upon leaving the hospital. ?- Continue warfarin and Coreg 6.25 mg twice daily outside of hospital ? ?Severe atrial stenosis ?Needs TAVR work-up outpatient ? ?CKD stage IIIa, resolved ? ?Normocytic anemia, likely secondary to CKD  ?hemoglobin stable, 8.7 today. ? ?Type 2 diabetes, stable ?Continue Levemir 15 U, resume home dose on discharge  ? ?FEN/GI: Heart healthy ?PPx: Coumadin ?Dispo: Difficulty with case management and insurance declining SNF.  Patient is medically stable for discharge, will likely go home today ? ?Subjective:  ?Patient is in good spirits this morning and doing  well. ? ?Objective: ?Temp:  [97.9 ?F (36.6 ?C)-98.2 ?F (36.8 ?C)] 97.9 ?F (36.6 ?C) (04/04 1638) ?Pulse Rate:  [73-82] 78 (04/04 0844) ?Resp:  [14-19] 17 (04/04 0844) ?BP: (102-140)/(53-82) 120/77 (04/04 0844) ?SpO2:  [94 %-97 %] 94 % (04/04 0844) ?General: Alert and oriented in no apparent distress, resting in bed  ?Heart: Regular rate and rhythm with no murmurs appreciated ?Lungs: CTA bilaterally, no wheezing ?Abdomen: Bowel sounds present, no abdominal pain ?Skin: Warm and dry ? ? ?Laboratory: ?Recent Labs  ?Lab 07/27/21 ?0250 07/28/21 ?0134 07/29/21 ?4665  ?WBC 6.2 6.5 6.3  ?HGB 9.1* 8.6* 8.7*  ?HCT 28.7* 28.0* 28.0*  ?PLT 183 175 162  ? ?Recent Labs  ?Lab 07/25/21 ?0330 07/26/21 ?0259 07/27/21 ?0250  ?NA 137 137 138  ?K 3.9 4.0 3.8  ?CL 97* 99 97*  ?CO2 34* 31 34*  ?BUN 28* 24* 18  ?CREATININE 1.09 0.99 1.01  ?CALCIUM 8.3* 8.3* 8.3*  ?GLUCOSE 83 48* 111*  ? ? ? ?Erskine Emery, MD ?07/29/2021, 9:24 AM ?PGY-1, [facility] ?[place] Intern pager: [phone], text pages welcome ? ?

## 2021-07-29 NOTE — Discharge Instructions (Addendum)
Dear Rodney [name],  ? ?Thank you so much for allowing [place] to be part of your care!  You were admitted to [facility] for respiratory distress and shortness of breath.  ? ?We were able to treat you with steroids and antibiotics as well as remdesivir for your COVID infection  ? ?We also started you on an amiodarone drip medication to assist with your heart rate  ? ?We have started Warfarin 2.5 mg for you to start when you leave the hospital. We will need to closely follow up at the coumadin clinic.  ? ? ?POST-HOSPITAL & CARE INSTRUCTIONS ?Please continue with warfarin to prevent clotting, we will check the INR level regularly  ?Please follow up with valve clinic as well  ?Please let PCP/Specialists know of any changes that were made.  ?Please see medications section of this packet for any medication changes.  ? ?DOCTOR'S APPOINTMENT & FOLLOW UP CARE INSTRUCTIONS  ?Future Appointments  ?Date Time Provider [place]  ?08/04/2021  9:30 AM [name], MD [facility] [place]  ?08/25/2021  1:40 PM Burnell Blanks, MD [facility]  ?09/05/2021  3:00 PM O'Neal, Cassie Freer, MD [facility]  ? ? ?RETURN PRECAUTIONS: ? ? ?Take care and be well! ? ?Family Medicine Teaching Service  ?[place]  ?[facility]  ?[address] [place], [place] [address] ?[phone] ? ?

## 2021-07-29 NOTE — TOC Progression Note (Signed)
Transition of Care (TOC) - Progression Note  ? ? ?Patient Details  ?Name: Rodney [name] ?MRN: [id number] ?Date of Birth: 01-29-1951 ? ?Transition of Care (TOC) CM/SW Contact  ?Joanne Chars, LCSW ?Phone Number: ?07/29/2021, 9:57 AM ? ?Clinical Narrative:   CSW received call from [place], after peer to peer, SNF [name] has been denied.  Appeal can be called in [phone] and they will need documentation faxed to [phone].  ? ?CSW informed pt and he would like to proceed with plan to discharge home with [facility].  MD/ RNCM informed. ? ? ? ?Expected Discharge Plan: [place] ?Barriers to Discharge: Continued Medical Work up, Other (must enter comment), SNF Pending bed offer (covid positive test date 3/26) ? ?Expected Discharge Plan and Services ?Expected Discharge Plan: [place] ?In-house Referral: Clinical Social Work ?  ?Post Acute Care Choice: [place] ?Living arrangements for the past 2 months: [place] ?Expected Discharge Date: 07/26/21               ?  ?  ?  ?  ?  ?  ?  ?  ?  ?  ? ? ?Social Determinants of Health (SDOH) Interventions ?  ? ?Readmission Risk Interventions ? ?  06/26/2021  ?  4:36 PM 05/16/2021  ?  3:13 PM 03/26/2021  ?  1:31 PM  ?Readmission Risk Prevention Plan  ?Transportation Screening Complete Complete Complete  ?Medication Review Press photographer) Complete Complete Complete  ?PCP or Specialist appointment within 3-5 days of discharge Complete Complete   ?[place] or Home Care Consult Complete Complete   ?SW Recovery Care/Counseling Consult Complete Complete Complete  ?Palliative Care Screening Not Applicable Not Applicable Not Applicable  ?Skilled Nursing Facility Complete Not Applicable Not Applicable  ? ? ?

## 2021-07-29 NOTE — Discharge Summary (Signed)
Family Medicine Teaching Service ?Hospital Discharge Summary ? ?Patient name: Rodney [name] Medical record number: [id number] ?Date of birth: 1950-09-18 Age: 71 y.o. Gender: male ?Date of Admission: 07/20/2021  Date of Discharge: 07/29/21 ?Admitting Physician: Lind Covert, MD ? ?Primary Care Provider: [name], MD ?Consultants: Cardiology  ? ?Indication for Hospitalization: respiratory distress  ? ?Discharge Diagnoses/Problem List:  ?Principal Problem: ?  Pneumonia ?Active Problems: ?  Severe aortic stenosis ?  Atrial flutter ([place]) ?  Acute respiratory failure with hypoxia ([place]) ?  Acute on chronic diastolic heart failure ([place]) ?Severe aortic stenosis  ?Peripheral neuropathy  ?HTN  ?Hypercholesterolemia  ?T2DM  ?Morbid obesity  ?PVD ?Anemia of chronic disease  ?pAFib  ?CKD  ? ? ?Disposition: Home with HH and [id number], PT, OT ? ?Discharge Condition: Stable  ? ?Discharge Exam: Blood pressure 120/77, pulse 78, temperature 97.9 ?F (36.6 ?C), temperature source Oral, resp. rate 17, height 6' (1.829 m), weight 100.2 kg, SpO2 94 %. ?General: Alert and oriented in no apparent distress, resting in bed  ?Heart: Regular rate and rhythm with no murmurs appreciated ?Lungs: CTA bilaterally, no wheezing ?Abdomen: Bowel sounds present, no abdominal pain ?Skin: Warm and dry ? ?Brief Hospital Course:  ?Rodney SPERLis a 71y.o.male with a history of type 2 diabetes, obesity, tobacco abuse, hypertension, protein calorie malnutrition, aortic stenosis, anemia of chronic disease, PVD, HFrEF, PAF, s/p BKA, CAD, CKD stage III, COPD [facility]. His [facility] course is detailed below: ? ?Acute hypoxic respiratory failure 2/2 pneumonia versus CHF exacerbation ?COPD ?HFrEF exacerbation ?COVID-19 positive ?Patient initially presented in respiratory distress requiring BiPAP.  He was started on DuoNebs as well as steroids and IV antibiotics.  His  chest x-ray on arrival showed multilobar bronchopneumonia.  He was tested for COVID-19 and was found to be COVID-positive.  He started on remdesivir which was transitioned to molnupoiravir and completed prior to discharge. On discharge, doing well on his home oxygen requirement.  ? ?HFrEF/severe AS ?Patient has known HFrEF with recent echocardiogram 06/23/2021 showing ejection fraction of 55 to 60%.  Cardiology was consulted who recommended TEVAR work-up when he recovers from COVID-19. ? ?Paroxysmal A-fib with intermittent atrial flutter ?On arrival patient was in 2-1 atrial flutter.  He was started on amiodarone drip which was rate controlled and transitioned to carvedilol 6.125.  He was discharged this dose.  I recommended restarting Coumadin for outpatient anticoagulation for his history of DVT as well as this atrial fibrillation with a flutter.  Discharge recommendations from cardiology included atorvastatin 80 mg daily, carvedilol 6.25 mg twice daily, Plavix 75 mg daily, Lasix 40 mg daily, warfarin per pharmacy.  Spoke with pharmacy and patient was discharged on warfarin dose of 2.5 mg per pharmacy recommendations which was to be started on 4/3.  Cardiology has scheduled patient for follow-up with valve clinic. ? ?T2DM ?Patient's sugars well controlled throughout the hospitalization.  On the morning of discharge patient did have hypoglycemia of 43.  His home medication regiment was 25 units of Lantus but he reports his diet has decreased and he does not like the food in the hospital.  Given he is going to rehab facility will decrease Lantus dose to 15 units daily and will need to be titrated outpatient.  ? ?Patient was originally supposed to go to a SNF after discharge. However, insurance declined peer to peer and authorization. [id number], OT.  ? ?Other chronic conditions were medically managed with home medications and formulary alternatives as necessary (normocytic anemia, CKD stage  IIIa, T2DM, PAD, insomnia) ? ?PCP Follow-up Recommendations: ?1.  BMP in 1 week to reassess kidney function ?2.  Discharged on a decreased dose of 2.5 mg warfarin daily (given supratherapeutic levels) to be restarted on 07/29/21. Needs close INR f/u at coumadin clinic ?3.  Patient scheduled for follow-up with valve clinic ?4.  Continue to encourage amputation of gangrenous toe although patient continues to decline ?5.  Patient not discharged on amiodarone because of concern for cardioversion while he has not been on anticoagulation.  Needs to follow-up with cardiology regarding restarting this medication. ? ? ?Significant Procedures: None  ? ?Significant Labs and Imaging:  ?Recent Labs  ?Lab 07/27/21 ?0250 07/28/21 ?0134 07/29/21 ?3846  ?WBC 6.2 6.5 6.3  ?HGB 9.1* 8.6* 8.7*  ?HCT 28.7* 28.0* 28.0*  ?PLT 183 175 162  ? ?Recent Labs  ?Lab 07/23/21 ?0222 07/24/21 ?0158 07/25/21 ?0330 07/26/21 ?0259 07/27/21 ?0250  ?NA 136 138 137 137 138  ?K 4.3 3.9 3.9 4.0 3.8  ?CL 98 99 97* 99 97*  ?CO2 30 32 34* 31 34*  ?GLUCOSE 181* 55* 83 48* 111*  ?BUN 40* 36* 28* 24* 18  ?CREATININE 1.29* 1.19 1.09 0.99 1.01  ?CALCIUM 8.6* 8.6* 8.3* 8.3* 8.3*  ?MG  --  1.8  --   --   --   ? ? ?DG Chest Port 1 View ? ?Result Date: 07/20/2021 ?CLINICAL DATA:  71 year old male with history of respiratory distress and shortness of breath. EXAM: PORTABLE CHEST 1 VIEW COMPARISON:  Chest x-ray 06/23/2021. FINDINGS: Lung volumes are low. Ill-defined opacities and areas of interstitial prominence are noted throughout the lungs bilaterally, most severe in the mid to lower lungs (left-greater-than-right). Moderate left pleural effusion, some of which is likely partially loculated within the left major fissure. Small right pleural effusion. No pneumothorax. Pulmonary vasculature and cardiac silhouette are obscured. Atherosclerotic calcifications in the thoracic aorta. IMPRESSION: 1. The appearance the chest is concerning for severe multilobar bilateral  bronchopneumonia. 2. Chronic moderate left pleural effusion partially loculated in the left major fissure and small right pleural effusion. 3. Aortic atherosclerosis. Electronically Signed   By: Vinnie Langton M.D.   On: 07/20/2021 05:03  ? ? ? ? ?Results/Tests Pending at Time of Discharge: None  ? ?Discharge Medications:  ?Allergies as of 07/29/2021   ?No Known Allergies ?  ? ?  ?Medication List  ?  ? ?STOP taking these medications   ? ?amiodarone 200 MG tablet ?Commonly known as: PACERONE ?  ? ?  ? ?TAKE these medications   ? ?acetaminophen 500 MG tablet ?Commonly known as: TYLENOL ?Take 1,000 mg by mouth every 6 (six) hours as needed for mild pain. ?  ?albuterol 108 (90 Base) MCG/ACT inhaler ?Commonly known as: VENTOLIN HFA ?INHALE 2 PUFFS INTO THE LUNGS EVERY 6 (SIX) HOURS AS NEEDED FOR WHEEZING OR SHORTNESS OF BREATH. ?  ?atorvastatin 80 MG tablet ?Commonly known as: LIPITOR ?TAKE 1 TABLET EVERY DAY ?  ?BD Pen Needle Nano U/F 32G X 4 MM Misc ?Generic drug: Insulin Pen Needle ?Use to inject insulin up to 4 times daily as directed, ?  ?carvedilol 12.5 MG tablet ?Commonly known as: COREG ?Take 6.25 mg by mouth 2 (two) times daily with a meal. ?  ?clopidogrel 75 MG tablet ?Commonly known as: PLAVIX ?TAKE 1 TABLET EVERY DAY WITH BREAKFAST ?What changed: See the  new instructions. ?  ?FeroSul 325 (65 FE) MG tablet ?Generic drug: ferrous sulfate ?Take 1 tablet (325 mg total) by mouth every other day. ?  ?fluticasone 50 MCG/ACT nasal spray ?Commonly known as: FLONASE ?Place 2 sprays into both nostrils daily. ?  ?furosemide 40 MG tablet ?Commonly known as: LASIX ?Take 1 tablet (40 mg total) by mouth daily. ?  ?gabapentin 300 MG capsule ?Commonly known as: NEURONTIN ?Take 600 mg by mouth at bedtime. ?  ?insulin detemir 100 UNIT/ML FlexPen ?Commonly known as: LEVEMIR ?Inject 15 Units into the skin daily. ?What changed: how much to take ?  ?ipratropium-albuterol 0.5-2.5 (3) MG/3ML Soln ?Commonly known as: DUONEB ?Take 3 mLs  by nebulization every 4 (four) hours as needed. ?What changed: reasons to take this ?  ?lactose free nutrition Liqd ?Take 237 mLs by mouth 3 (three) times a week. ?  ?metFORMIN 1000 MG tablet ?Commonly known as: G

## 2021-07-29 NOTE — Progress Notes (Signed)
ANTICOAGULATION CONSULT NOTE  ? ?Pharmacy Consult for warfarin  ?Indication: atrial fibrillation, hx DVT ? ?No Known Allergies ? ?Vital Signs: ?Temp: 97.9 ?F (36.6 ?C) (04/04 0254) ?Temp Source: Oral (04/04 2706) ?BP: 120/77 (04/04 0844) ?Pulse Rate: 78 (04/04 0844) ? ?Labs: ?Recent Labs  ?  07/27/21 ?0250 07/28/21 ?0134 07/29/21 ?0213  ?HGB 9.1* 8.6* 8.7*  ?HCT 28.7* 28.0* 28.0*  ?PLT 183 175 162  ?LABPROT 31.8* 32.6* 28.7*  ?INR 3.1* 3.2* 2.7*  ?CREATININE 1.01  --   --   ? ?Estimated Creatinine Clearance: 84.6 mL/min (by C-G formula based on SCr of 1.01 mg/dL). ? ?Assessment: ?19 yom with a PMH of CAD, PAF, peripheral arterial disease s/p L BKA, chronic systolic heart failure with improved LVEF, and hx of DVT presented with chest pain. No PTA AC. Previously on warfarin in past but was stopped due to anemia requiring blood cell transfusions. Patient agreed to long-term anticoagulation. As cost of DOACs are prohibitive, pharmacy has been consulted for warfarin on 3/29.   ? ?Previous home warfarin regimen: 7.5 mg on M/W/F; 5 mg on all other days (noted from admission January 2023)  ? ?4/4 INR: 2.7, supratherapeutic ?Last dose warfarin 1 mg on 4/3. Completed enoxaparin 150 mg for bridge on 4/2. CBC stable. No bleeding noted in chart. Patient is being discharged today. ? ?Goal of Therapy:  ?INR 2-2.5 (due to hx of normocytic anemia requiring warfarin DC) ?Monitor platelets by anticoagulation protocol: Yes ?  ?Plan:  ?Discharge recommendation: Warfarin 2.5 mg PO daily with close f/u in coumadin clinic.  ?Daily CBC and INR.  ?Monitor for s/sx of bleeding. ? ? ?Willadean Carol, PharmD Candidate ?[facility] ?

## 2021-07-30 NOTE — Chronic Care Management (AMB) (Signed)
?  Care Management  ? ?Note ? ?07/30/2021 ?Name: LEGION DISCHER MRN: [id number] DOB: July 08, 1950 ? ?TERIK HAUGHEY is a 71 y.o. year old male who is a primary care patient of Hensel, Jamal Collin, MD and is actively engaged with the care management team. I reached out to [name] by phone today to assist with re-scheduling an initial visit with the RN Case Manager ? ?Follow up plan: ?Telephone appointment with care management team member scheduled for:08/05/21 ? ?Laverda Sorenson  ?Care Guide, [name] Care Coordination ?[place]  Care Management  ?Direct Dial: [phone] ? ?

## 2021-08-04 ENCOUNTER — Inpatient Hospital Stay: Payer: Medicare PPO | Admitting: Family Medicine

## 2021-08-05 ENCOUNTER — Telehealth: Payer: Self-pay

## 2021-08-05 ENCOUNTER — Ambulatory Visit: Payer: Medicare PPO

## 2021-08-05 NOTE — Chronic Care Management (AMB) (Signed)
? ?  RN Case Manager ?Care Management  ? Phone Outreach  ? ? ?08/05/2021 ?Name: Rodney [name] MRN: [id number] DOB: [birth year] ? ?Rodney [name] is a 71 y.o. year old male who is a primary care patient of Hensel, Jamal Collin, MD .  ? ?Unable to keep phone appointment today and requested to reschedule. ? ?Follow Up Plan: Appointment was rescheduled with CCM RN on 08/06/21 at 130 PM   ? ?Review of patient status, including review of consultants reports, relevant laboratory and other test results, and collaboration with appropriate care team members and the patient's provider was performed as part of comprehensive patient evaluation and provision of care management services.   ? ?Lazaro Arms RN, BSN, [facility] ?Care Management Coordinator ?[place] ?Phone: [phone] Fax: [phone] ?  ? ? ? ? ? ? ? ? ? ? ? ?

## 2021-08-05 NOTE — Telephone Encounter (Signed)
Barnetta Chapel [facility] RN calls nurse line requesting a verbal order for INR.  ? ?Barnetta Chapel reports patient informed her he missed 4/10 apt with PCP due to transportation reasons. Barnetta Chapel reported in his discharge summary close INR FU was noted.  ? ?Verbal given to Barnetta Chapel to check INR. Barnetta Chapel will call back with results.   ?

## 2021-08-06 ENCOUNTER — Ambulatory Visit: Payer: Medicare PPO

## 2021-08-06 ENCOUNTER — Telehealth: Payer: Self-pay

## 2021-08-06 NOTE — Chronic Care Management (AMB) (Signed)
? ?  RN Case Manager ?Care Management  ? Phone Outreach  ? ? ?08/06/2021 ?Name: Rodney [name] MRN: [id number] DOB: 08/29/1950 ? ?Rodney [name] is a 71 y.o. year old male who is a primary care patient of Hensel, Jamal Collin, MD .  ? ?I reach out for 2ndx attempt to attempt an Initial call. [name] said she had forgotten again and asked if I could call back. ? ?Follow Up Plan: Appointment was rescheduled with CCM RN on 08/07/21 at 130 pm.   ? ?Review of patient status, including review of consultants reports, relevant laboratory and other test results, and collaboration with appropriate care team members and the patient's provider was performed as part of comprehensive patient evaluation and provision of care management services.   ? ?Lazaro Arms RN, BSN, [facility] ?Care Management Coordinator ?[place] ?Phone: [phone] Fax: [phone] ?  ? ? ? ? ? ? ? ? ? ? ? ?

## 2021-08-06 NOTE — Telephone Encounter (Signed)
[facility] OT calls nurse line requesting verbal orders for [facility] OT as follows.  ? ?1x a week for 8 weeks  ? ?Verbal order given.  ?

## 2021-08-07 ENCOUNTER — Ambulatory Visit: Payer: Medicare PPO

## 2021-08-07 DIAGNOSIS — J449 Chronic obstructive pulmonary disease, unspecified: Secondary | ICD-10-CM

## 2021-08-07 DIAGNOSIS — Z789 Other specified health status: Secondary | ICD-10-CM

## 2021-08-07 DIAGNOSIS — I5020 Unspecified systolic (congestive) heart failure: Secondary | ICD-10-CM

## 2021-08-08 ENCOUNTER — Telehealth: Payer: Self-pay | Admitting: *Deleted

## 2021-08-08 NOTE — Patient Instructions (Signed)
?Mr. Murata  it was nice speaking with you. Please call me directly [phone] if you have questions about the goals we discussed. ? ? ? ?Patient Care Plan: RN Case Manager  ?  ? ?Problem Identified: General Plan of Care in a patient with COPD   ?  ? ?Long-Range Goal: The patient and cartaker will learn skills to prevent exacerbation   ?Start Date: 08/07/2021  ?Expected End Date: 10/24/2021  ?Priority: High  ?Note:   ?Current Barriers:  ?Chronic Disease Management support and education needs related to COPD ?Transportation barriers ? ?RNCM Clinical Goal(s):  ?Patient will verbalize understanding of plan for management of COPD as evidenced by patient having no admissions in 30 days  through collaboration with RN Care manager, provider, and care team.  ? ?Interventions: ?1:1 collaboration with primary care provider regarding development and update of comprehensive plan of care as evidenced by provider attestation and co-signature ?Inter-disciplinary care team collaboration (see longitudinal plan of care) ?Evaluation of current treatment plan related to  self management and patient's adherence to plan as established by provider ? ? ?COPD: (Status: New goal.) Long Term Goal  ?Reviewed medications with patient, including use of prescribed maintenance and rescue inhalers, and provided instruction on medication management and the importance of adherence ?Will provided patient with basic written COPD education on self care/management/and exacerbation prevention ?Will provided written instructions on pursed lip breathing and utilized returned demonstration as teach back ?Advised patient to self assesses COPD action plan zone and make appointment with provider if in the yellow zone for 48 hours without improvement ?Discussed the importance of adequate rest and management of fatigue with COPD ?08/07/21:  I spoke with [name], Mr. Fleer partner, by telephone for an initial visit; he gave me permission to talk with her; in  response to the referral. I spoke with her about the patient and explained my role with CCM and co-workers. [name] informed me that she and the patient live in the home together, and she helps provide his care regarding the Adls and Iadls. I completed a review of his medical history, allergies, medications, falls, and health status and inquired about SDOH; she stated that he needed transportation to his appointments due to him being in a wheelchair, and she could not lift him to get in and out of the vehicle. I explained that I would make a referral to the care guides for transportation and also send them some information about COPD and a Calendar.  ?   ?  ? ? ?Patient Goals/Self Care Activities: ?-Patient/Caregiver will self-administer medications as prescribed as evidenced by self-report/primary caregiver report  ?-Patient/Caregiver will attend all scheduled provider appointments as evidenced by clinician review of documented attendance to scheduled appointments and patient/caregiver report ?-Patient/Caregiver will call pharmacy for medication refills as evidenced by patient report and review of pharmacy fill history as appropriate ?-Patient/Caregiver will call provider office for new concerns or questions as evidenced by review of documented incoming telephone call notes and patient report ?-Patient/Caregiver verbalizes understanding of plan ?-Patient/Caregiver will focus on medication adherence by taking medications as prescribed ?-Avoid smoke and air pollution ?-Keep your airway clear from mucus build up  ?-Visit your doctor on a regular basis ?-Self assess COPD action plan zone and make appointment with provider if you have been in the yellow zone for 48 hours without improvement. ? ?  ?  ? ?Mr. Yanik received Care Management services today:  ?Care Management services include personalized support from designated clinical staff supervised by his  physician, including individualized plan of care and  coordination with other care providers ?24/7 contact [phone] for assistance for urgent and routine care needs. ?Care Management are voluntary services and be declined at any time by calling the office. ? ?The patient verbalized understanding of instructions provided today and agreed to receive a mailed copy of patient instruction and/or educational materials.  ? ?Follow Up Plan: Patient would like continued follow-up.  CCM RNCM will outreach the patient within the next 2 weeks.  Patient will call office if needed prior to next encounter ? ? ?Lazaro Arms, RN  ? ?[phone]  ?

## 2021-08-08 NOTE — Telephone Encounter (Signed)
? ?  Telephone encounter was:  Unsuccessful.  08/08/2021 ?Name: Rodney [name] MRN: [id number] DOB: [birth year] ? ?Unsuccessful outbound call made today to assist with:  Transportation Needs  ? ?Outreach Attempt:  1st Attempt ? ?A HIPAA compliant voice message was left requesting a return call.  Instructed patient to call back at   Instructed patient to call back at [phone]  at their earliest convenience. . ? ?Sakiyah Shur Greenauer -Selinda Eon ?Care Guide , [name] Care Coordination ?[place], Care Management  ?[phone] ?[address] , [place] [address] ?Email : Ashby Dawes. Greenauer-moran [email or web address] ?  ?

## 2021-08-08 NOTE — Chronic Care Management (AMB) (Signed)
? Care Management ?  ? RN Visit Note ? ?08/08/2021 ?Name: Rodney [name] MRN: [id number] DOB: 05/13/1950 ? ?Subjective: ?Rodney [name] is a 71 y.o. year old male who is a primary care patient of Hensel, Jamal Collin, MD. The care management team was consulted for assistance with disease management and care coordination needs.   ? ?Engaged with patient by telephone for initial visit in response to provider referral for case management and/or care coordination services.  ? ?Consent to Services:  ? Rodney [name] was given information about Care Management services today including:  ?Care Management services includes personalized support from designated clinical staff supervised by his physician, including individualized plan of care and coordination with other care providers ?24/7 contact phone numbers for assistance for urgent and routine care needs. ?The patient may stop case management services at any time by phone call to the office staff. ? ?Patient agreed to services and consent obtained.  ? ? ?Summary: Patient's partner Rodney [name] provided all information during this encounter. Patient is making progress with his chronic conditions , but currently is experiencing difficulty with transportation to appointments . See Care Plan below for interventions and patient self-care actives. ? ?Recommendation: The patient may benefit from taking medications as prescribed and The patient agrees. ? ?Follow up Plan: Patient would like continued follow-up.  CCM RNCM will outreach the patient within the next 2 weeks.  Patient will call office if needed prior to next encounter ? ? ?Assessment: Review of patient past medical history, allergies, medications, health status, including review of consultants reports, laboratory and other test data, was performed as part of comprehensive evaluation and provision of chronic care management services.  ? ?SDOH (Social Determinants of Health) assessments and interventions performed:  ?SDOH  Interventions   ? ?Flowsheet Row Most Recent Value  ?SDOH Interventions   ?Transportation Interventions --  [Referral made to care guides for help.]  ? ?  ?  ? ?Care Plan ? ? ? ?Conditions to be addressed/monitored: COPD ? ?Care Plan : RN Case Manager  ?Updates made by Lazaro Arms, RN since 08/08/2021 12:00 AM  ?  ? ?Problem: General Plan of Care in a patient with COPD   ?  ? ?Long-Range Goal: The patient and cartaker will learn skills to prevent exacerbation   ?Start Date: 08/07/2021  ?Expected End Date: 10/24/2021  ?Priority: High  ?Note:   ?Current Barriers:  ?Chronic Disease Management support and education needs related to COPD ?Transportation barriers ? ?RNCM Clinical Goal(s):  ?Patient will verbalize understanding of plan for management of COPD as evidenced by patient having no admissions in 30 days  through collaboration with RN Care manager, provider, and care team.  ? ?Interventions: ?1:1 collaboration with primary care provider regarding development and update of comprehensive plan of care as evidenced by provider attestation and co-signature ?Inter-disciplinary care team collaboration (see longitudinal plan of care) ?Evaluation of current treatment plan related to  self management and patient's adherence to plan as established by provider ? ? ?COPD: (Status: New goal.) Long Term Goal  ?Reviewed medications with patient, including use of prescribed maintenance and rescue inhalers, and provided instruction on medication management and the importance of adherence ?Will provided patient with basic written COPD education on self care/management/and exacerbation prevention ?Will provided written instructions on pursed lip breathing and utilized returned demonstration as teach back ?Advised patient to self assesses COPD action plan zone and make appointment with provider if in the yellow zone for 48 hours without improvement ?Discussed the  importance of adequate rest and management of fatigue with COPD ?08/07/21:   I spoke with Rodney [name], Rodney [name] partner, by telephone for an initial visit; he gave me permission to talk with her; in response to the referral. I spoke with her about the patient and explained my role with CCM and co-workers. Rodney [name] informed me that she and the patient live in the home together, and she helps provide his care regarding the Adls and Iadls. I completed a review of his medical history, allergies, medications, falls, and health status and inquired about SDOH; she stated that he needed transportation to his appointments due to him being in a wheelchair, and she could not lift him to get in and out of the vehicle. I explained that I would make a referral to the care guides for transportation and also send them some information about COPD and a Calendar.  ?   ?  ? ? ?Patient Goals/Self Care Activities: ?-Patient/Caregiver will self-administer medications as prescribed as evidenced by self-report/primary caregiver report  ?-Patient/Caregiver will attend all scheduled provider appointments as evidenced by clinician review of documented attendance to scheduled appointments and patient/caregiver report ?-Patient/Caregiver will call pharmacy for medication refills as evidenced by patient report and review of pharmacy fill history as appropriate ?-Patient/Caregiver will call provider office for new concerns or questions as evidenced by review of documented incoming telephone call notes and patient report ?-Patient/Caregiver verbalizes understanding of plan ?-Patient/Caregiver will focus on medication adherence by taking medications as prescribed ?-Avoid smoke and air pollution ?-Keep your airway clear from mucus build up  ?-Visit your doctor on a regular basis ?-Self assess COPD action plan zone and make appointment with provider if you have been in the yellow zone for 48 hours without improvement. ? ?  ? ?Lazaro Arms RN, BSN, [place] ?Care Management Coordinator ?[place]  ?Phone:  [phone]  ?  ? ? ? ? ? ? ? ? ? ?

## 2021-08-11 ENCOUNTER — Telehealth: Payer: Self-pay | Admitting: *Deleted

## 2021-08-11 ENCOUNTER — Other Ambulatory Visit: Payer: Self-pay

## 2021-08-11 NOTE — Telephone Encounter (Signed)
? ?  Telephone encounter was:  Unsuccessful.  08/11/2021 ?Name: Rodney [name] MRN: [id number] DOB: 1950/05/14 ? ?Unsuccessful outbound call made today to assist with:  Transportation Needs  ? ?Outreach Attempt:  2nd Attempt ? ?A HIPAA compliant voice message was left requesting a return call.  Instructed patient to call back at   Instructed patient to call back at [phone]  at their earliest convenience. . ? ?Iridiana Fonner Greenauer -Selinda Eon ?Care Guide , [name] Care Coordination ?[place], Care Management  ?[phone] ?[address] , [place] [address] ?Email : Ashby [email or web address]. [email or web address]-moran [email or web address] ?  ? ?

## 2021-08-12 MED ORDER — PANTOPRAZOLE SODIUM 40 MG PO TBEC: 40.0000 mg | DELAYED_RELEASE_TABLET | Freq: Two times a day (BID) | ORAL | 0 refills | Status: DC

## 2021-08-13 ENCOUNTER — Other Ambulatory Visit: Payer: Self-pay | Admitting: Cardiovascular Disease

## 2021-08-14 ENCOUNTER — Telehealth: Payer: Self-pay | Admitting: *Deleted

## 2021-08-14 NOTE — Telephone Encounter (Signed)
? ?  Telephone encounter was:  Successful.  ?08/14/2021 ?Name: Rodney [name] MRN: [id number] DOB: [birth year] ? ?[name] is a 71 y.o. year old male who is a primary care patient of Hensel, Jamal Collin, MD . The community resource team was consulted for assistance with patient is scheduling [facility] for transportation according to friend [name] , no need for other resources  ? ?Care guide performed the following interventions: Patient provided with information about care guide support team and interviewed to confirm resource needs. ? ?Follow Up Plan:  No further follow up planned at this time. The patient has been provided with needed resources. ? ?Lovett Sox -Selinda Eon ?Care Guide , [name] Care Coordination ?[place], Care Management  ?[phone] ?[address] , [place] [address] ?Email : Ashby Dawes. [email or web address]-moran [email or web address] ?  ? ?

## 2021-08-19 ENCOUNTER — Emergency Department (HOSPITAL_COMMUNITY): Payer: Medicare PPO

## 2021-08-19 ENCOUNTER — Other Ambulatory Visit: Payer: Self-pay

## 2021-08-19 ENCOUNTER — Observation Stay (HOSPITAL_COMMUNITY): Payer: Medicare PPO

## 2021-08-19 ENCOUNTER — Encounter (HOSPITAL_COMMUNITY): Payer: Self-pay | Admitting: Emergency Medicine

## 2021-08-19 ENCOUNTER — Inpatient Hospital Stay (HOSPITAL_COMMUNITY): Payer: Medicare PPO

## 2021-08-19 ENCOUNTER — Inpatient Hospital Stay (HOSPITAL_COMMUNITY)
Admission: EM | Admit: 2021-08-19 | Discharge: 2021-09-03 | DRG: 981 | Disposition: A | Discharge status: Home / Self Care | Payer: Medicare PPO | Attending: Family Medicine | Admitting: Family Medicine

## 2021-08-19 DIAGNOSIS — A0471 Enterocolitis due to Clostridium difficile, recurrent: Secondary | ICD-10-CM | POA: Diagnosis not present

## 2021-08-19 DIAGNOSIS — E1122 Type 2 diabetes mellitus with diabetic chronic kidney disease: Secondary | ICD-10-CM | POA: Diagnosis present

## 2021-08-19 DIAGNOSIS — I4819 Other persistent atrial fibrillation: Secondary | ICD-10-CM | POA: Diagnosis present

## 2021-08-19 DIAGNOSIS — I2510 Atherosclerotic heart disease of native coronary artery without angina pectoris: Secondary | ICD-10-CM | POA: Diagnosis not present

## 2021-08-19 DIAGNOSIS — Z7901 Long term (current) use of anticoagulants: Secondary | ICD-10-CM

## 2021-08-19 DIAGNOSIS — J9622 Acute and chronic respiratory failure with hypercapnia: Secondary | ICD-10-CM | POA: Diagnosis present

## 2021-08-19 DIAGNOSIS — I110 Hypertensive heart disease with heart failure: Secondary | ICD-10-CM | POA: Diagnosis not present

## 2021-08-19 DIAGNOSIS — J449 Chronic obstructive pulmonary disease, unspecified: Secondary | ICD-10-CM | POA: Diagnosis not present

## 2021-08-19 DIAGNOSIS — N1832 Chronic kidney disease, stage 3b: Secondary | ICD-10-CM | POA: Diagnosis present

## 2021-08-19 DIAGNOSIS — J441 Chronic obstructive pulmonary disease with (acute) exacerbation: Principal | ICD-10-CM

## 2021-08-19 DIAGNOSIS — I739 Peripheral vascular disease, unspecified: Secondary | ICD-10-CM | POA: Diagnosis not present

## 2021-08-19 DIAGNOSIS — Z6829 Body mass index (BMI) 29.0-29.9, adult: Secondary | ICD-10-CM

## 2021-08-19 DIAGNOSIS — K029 Dental caries, unspecified: Secondary | ICD-10-CM | POA: Diagnosis not present

## 2021-08-19 DIAGNOSIS — Z86718 Personal history of other venous thrombosis and embolism: Secondary | ICD-10-CM

## 2021-08-19 DIAGNOSIS — D631 Anemia in chronic kidney disease: Secondary | ICD-10-CM | POA: Diagnosis present

## 2021-08-19 DIAGNOSIS — Z87891 Personal history of nicotine dependence: Secondary | ICD-10-CM

## 2021-08-19 DIAGNOSIS — R069 Unspecified abnormalities of breathing: Secondary | ICD-10-CM | POA: Diagnosis not present

## 2021-08-19 DIAGNOSIS — I700 Atherosclerosis of aorta: Secondary | ICD-10-CM | POA: Diagnosis not present

## 2021-08-19 DIAGNOSIS — Z8701 Personal history of pneumonia (recurrent): Secondary | ICD-10-CM

## 2021-08-19 DIAGNOSIS — I5020 Unspecified systolic (congestive) heart failure: Secondary | ICD-10-CM | POA: Diagnosis present

## 2021-08-19 DIAGNOSIS — E1142 Type 2 diabetes mellitus with diabetic polyneuropathy: Secondary | ICD-10-CM | POA: Diagnosis present

## 2021-08-19 DIAGNOSIS — Z8616 Personal history of COVID-19: Secondary | ICD-10-CM

## 2021-08-19 DIAGNOSIS — E874 Mixed disorder of acid-base balance: Secondary | ICD-10-CM | POA: Diagnosis present

## 2021-08-19 DIAGNOSIS — L97516 Non-pressure chronic ulcer of other part of right foot with bone involvement without evidence of necrosis: Secondary | ICD-10-CM | POA: Diagnosis present

## 2021-08-19 DIAGNOSIS — I70261 Atherosclerosis of native arteries of extremities with gangrene, right leg: Secondary | ICD-10-CM | POA: Diagnosis present

## 2021-08-19 DIAGNOSIS — E1149 Type 2 diabetes mellitus with other diabetic neurological complication: Secondary | ICD-10-CM | POA: Diagnosis present

## 2021-08-19 DIAGNOSIS — J9621 Acute and chronic respiratory failure with hypoxia: Secondary | ICD-10-CM | POA: Diagnosis not present

## 2021-08-19 DIAGNOSIS — I509 Heart failure, unspecified: Secondary | ICD-10-CM | POA: Diagnosis not present

## 2021-08-19 DIAGNOSIS — Z9289 Personal history of other medical treatment: Secondary | ICD-10-CM

## 2021-08-19 DIAGNOSIS — Z7902 Long term (current) use of antithrombotics/antiplatelets: Secondary | ICD-10-CM

## 2021-08-19 DIAGNOSIS — Z7984 Long term (current) use of oral hypoglycemic drugs: Secondary | ICD-10-CM | POA: Diagnosis not present

## 2021-08-19 DIAGNOSIS — I5032 Chronic diastolic (congestive) heart failure: Secondary | ICD-10-CM | POA: Diagnosis not present

## 2021-08-19 DIAGNOSIS — N179 Acute kidney failure, unspecified: Secondary | ICD-10-CM | POA: Diagnosis not present

## 2021-08-19 DIAGNOSIS — I480 Paroxysmal atrial fibrillation: Secondary | ICD-10-CM | POA: Diagnosis not present

## 2021-08-19 DIAGNOSIS — J90 Pleural effusion, not elsewhere classified: Secondary | ICD-10-CM | POA: Diagnosis present

## 2021-08-19 DIAGNOSIS — I70202 Unspecified atherosclerosis of native arteries of extremities, left leg: Secondary | ICD-10-CM | POA: Diagnosis present

## 2021-08-19 DIAGNOSIS — E7800 Pure hypercholesterolemia, unspecified: Secondary | ICD-10-CM | POA: Diagnosis present

## 2021-08-19 DIAGNOSIS — I4892 Unspecified atrial flutter: Secondary | ICD-10-CM | POA: Diagnosis present

## 2021-08-19 DIAGNOSIS — N189 Chronic kidney disease, unspecified: Secondary | ICD-10-CM | POA: Diagnosis present

## 2021-08-19 DIAGNOSIS — Z8619 Personal history of other infectious and parasitic diseases: Secondary | ICD-10-CM

## 2021-08-19 DIAGNOSIS — J439 Emphysema, unspecified: Secondary | ICD-10-CM | POA: Diagnosis not present

## 2021-08-19 DIAGNOSIS — I350 Nonrheumatic aortic (valve) stenosis: Secondary | ICD-10-CM | POA: Diagnosis not present

## 2021-08-19 DIAGNOSIS — R9431 Abnormal electrocardiogram [ECG] [EKG]: Secondary | ICD-10-CM | POA: Diagnosis not present

## 2021-08-19 DIAGNOSIS — D500 Iron deficiency anemia secondary to blood loss (chronic): Secondary | ICD-10-CM | POA: Diagnosis not present

## 2021-08-19 DIAGNOSIS — I152 Hypertension secondary to endocrine disorders: Secondary | ICD-10-CM | POA: Diagnosis present

## 2021-08-19 DIAGNOSIS — E669 Obesity, unspecified: Secondary | ICD-10-CM | POA: Diagnosis present

## 2021-08-19 DIAGNOSIS — R0602 Shortness of breath: Secondary | ICD-10-CM | POA: Diagnosis not present

## 2021-08-19 DIAGNOSIS — Z89411 Acquired absence of right great toe: Secondary | ICD-10-CM

## 2021-08-19 DIAGNOSIS — I96 Gangrene, not elsewhere classified: Secondary | ICD-10-CM | POA: Diagnosis not present

## 2021-08-19 DIAGNOSIS — Z7951 Long term (current) use of inhaled steroids: Secondary | ICD-10-CM

## 2021-08-19 DIAGNOSIS — N400 Enlarged prostate without lower urinary tract symptoms: Secondary | ICD-10-CM | POA: Diagnosis present

## 2021-08-19 DIAGNOSIS — S98111A Complete traumatic amputation of right great toe, initial encounter: Secondary | ICD-10-CM | POA: Diagnosis not present

## 2021-08-19 DIAGNOSIS — M869 Osteomyelitis, unspecified: Secondary | ICD-10-CM | POA: Diagnosis present

## 2021-08-19 DIAGNOSIS — Z794 Long term (current) use of insulin: Secondary | ICD-10-CM

## 2021-08-19 DIAGNOSIS — J432 Centrilobular emphysema: Secondary | ICD-10-CM | POA: Diagnosis not present

## 2021-08-19 DIAGNOSIS — I77810 Thoracic aortic ectasia: Secondary | ICD-10-CM | POA: Diagnosis present

## 2021-08-19 DIAGNOSIS — Q256 Stenosis of pulmonary artery: Secondary | ICD-10-CM

## 2021-08-19 DIAGNOSIS — J9611 Chronic respiratory failure with hypoxia: Secondary | ICD-10-CM | POA: Diagnosis not present

## 2021-08-19 DIAGNOSIS — L0390 Cellulitis, unspecified: Secondary | ICD-10-CM | POA: Diagnosis not present

## 2021-08-19 DIAGNOSIS — Z9981 Dependence on supplemental oxygen: Secondary | ICD-10-CM

## 2021-08-19 DIAGNOSIS — R0902 Hypoxemia: Secondary | ICD-10-CM | POA: Diagnosis not present

## 2021-08-19 DIAGNOSIS — G4700 Insomnia, unspecified: Secondary | ICD-10-CM | POA: Diagnosis present

## 2021-08-19 DIAGNOSIS — J9612 Chronic respiratory failure with hypercapnia: Secondary | ICD-10-CM | POA: Diagnosis not present

## 2021-08-19 DIAGNOSIS — I5043 Acute on chronic combined systolic (congestive) and diastolic (congestive) heart failure: Secondary | ICD-10-CM | POA: Diagnosis not present

## 2021-08-19 DIAGNOSIS — J9811 Atelectasis: Secondary | ICD-10-CM | POA: Diagnosis present

## 2021-08-19 DIAGNOSIS — E1152 Type 2 diabetes mellitus with diabetic peripheral angiopathy with gangrene: Secondary | ICD-10-CM | POA: Diagnosis present

## 2021-08-19 DIAGNOSIS — R000 Tachycardia, unspecified: Secondary | ICD-10-CM | POA: Diagnosis not present

## 2021-08-19 DIAGNOSIS — E876 Hypokalemia: Secondary | ICD-10-CM | POA: Diagnosis not present

## 2021-08-19 DIAGNOSIS — Z95820 Peripheral vascular angioplasty status with implants and grafts: Secondary | ICD-10-CM

## 2021-08-19 DIAGNOSIS — R599 Enlarged lymph nodes, unspecified: Secondary | ICD-10-CM | POA: Diagnosis not present

## 2021-08-19 DIAGNOSIS — I129 Hypertensive chronic kidney disease with stage 1 through stage 4 chronic kidney disease, or unspecified chronic kidney disease: Secondary | ICD-10-CM | POA: Diagnosis not present

## 2021-08-19 DIAGNOSIS — Z89512 Acquired absence of left leg below knee: Secondary | ICD-10-CM

## 2021-08-19 DIAGNOSIS — Z7401 Bed confinement status: Secondary | ICD-10-CM | POA: Diagnosis not present

## 2021-08-19 DIAGNOSIS — Z20822 Contact with and (suspected) exposure to covid-19: Secondary | ICD-10-CM | POA: Diagnosis not present

## 2021-08-19 DIAGNOSIS — E1169 Type 2 diabetes mellitus with other specified complication: Secondary | ICD-10-CM | POA: Diagnosis present

## 2021-08-19 DIAGNOSIS — Z01818 Encounter for other preprocedural examination: Secondary | ICD-10-CM | POA: Diagnosis not present

## 2021-08-19 DIAGNOSIS — Z79899 Other long term (current) drug therapy: Secondary | ICD-10-CM

## 2021-08-19 DIAGNOSIS — R531 Weakness: Secondary | ICD-10-CM | POA: Diagnosis not present

## 2021-08-19 DIAGNOSIS — I4891 Unspecified atrial fibrillation: Secondary | ICD-10-CM | POA: Diagnosis not present

## 2021-08-19 DIAGNOSIS — I2582 Chronic total occlusion of coronary artery: Secondary | ICD-10-CM | POA: Diagnosis present

## 2021-08-19 DIAGNOSIS — K219 Gastro-esophageal reflux disease without esophagitis: Secondary | ICD-10-CM | POA: Diagnosis present

## 2021-08-19 DIAGNOSIS — R0689 Other abnormalities of breathing: Secondary | ICD-10-CM | POA: Diagnosis not present

## 2021-08-19 DIAGNOSIS — Z993 Dependence on wheelchair: Secondary | ICD-10-CM

## 2021-08-19 DIAGNOSIS — R0600 Dyspnea, unspecified: Secondary | ICD-10-CM | POA: Diagnosis not present

## 2021-08-19 LAB — BRAIN NATRIURETIC PEPTIDE: B Natriuretic Peptide: 692.1 pg/mL — ABNORMAL HIGH (ref 0.0–100.0)

## 2021-08-19 LAB — CBC WITH DIFFERENTIAL/PLATELET
Abs Immature Granulocytes: 0.04 10*3/uL (ref 0.00–0.07)
Basophils Absolute: 0.1 10*3/uL (ref 0.0–0.1)
Basophils Relative: 1 %
Eosinophils Absolute: 0.3 10*3/uL (ref 0.0–0.5)
Eosinophils Relative: 3 %
HCT: 30.3 % — ABNORMAL LOW (ref 39.0–52.0)
Hemoglobin: 9.2 g/dL — ABNORMAL LOW (ref 13.0–17.0)
Immature Granulocytes: 0 %
Lymphocytes Relative: 9 %
Lymphs Abs: 0.8 10*3/uL (ref 0.7–4.0)
MCH: 29.6 pg (ref 26.0–34.0)
MCHC: 30.4 g/dL (ref 30.0–36.0)
MCV: 97.4 fL (ref 80.0–100.0)
Monocytes Absolute: 0.8 10*3/uL (ref 0.1–1.0)
Monocytes Relative: 8 %
Neutro Abs: 7.3 10*3/uL (ref 1.7–7.7)
Neutrophils Relative %: 79 %
Platelets: 329 10*3/uL (ref 150–400)
RBC: 3.11 MIL/uL — ABNORMAL LOW (ref 4.22–5.81)
RDW: 16.8 % — ABNORMAL HIGH (ref 11.5–15.5)
WBC: 9.2 10*3/uL (ref 4.0–10.5)
nRBC: 0 % (ref 0.0–0.2)

## 2021-08-19 LAB — TROPONIN I (HIGH SENSITIVITY)
Troponin I (High Sensitivity): 36 ng/L — ABNORMAL HIGH (ref <18)
Troponin I (High Sensitivity): 40 ng/L — ABNORMAL HIGH (ref <18)

## 2021-08-19 LAB — BASIC METABOLIC PANEL
Anion gap: 7 (ref 5–15)
BUN: 31 mg/dL — ABNORMAL HIGH (ref 8–23)
CO2: 35 mmol/L — ABNORMAL HIGH (ref 22–32)
Calcium: 8.5 mg/dL — ABNORMAL LOW (ref 8.9–10.3)
Chloride: 98 mmol/L (ref 98–111)
Creatinine, Ser: 2.15 mg/dL — ABNORMAL HIGH (ref 0.61–1.24)
GFR, Estimated: 33 mL/min — ABNORMAL LOW (ref >60)
Glucose, Bld: 219 mg/dL — ABNORMAL HIGH (ref 70–99)
Potassium: 4.4 mmol/L (ref 3.5–5.1)
Sodium: 140 mmol/L (ref 135–145)

## 2021-08-19 LAB — RESP PANEL BY RT-PCR (FLU A&B, COVID) ARPGX2
Influenza A by PCR: NEGATIVE
Influenza B by PCR: NEGATIVE
SARS Coronavirus 2 by RT PCR: NEGATIVE

## 2021-08-19 LAB — I-STAT ARTERIAL BLOOD GAS, ED
Acid-Base Excess: 9 mmol/L — ABNORMAL HIGH (ref 0.0–2.0)
Acid-Base Excess: 11 mmol/L — ABNORMAL HIGH (ref 0.0–2.0)
Bicarbonate: 36.7 mmol/L — ABNORMAL HIGH (ref 20.0–28.0)
Bicarbonate: 37.7 mmol/L — ABNORMAL HIGH (ref 20.0–28.0)
Calcium, Ion: 1.19 mmol/L (ref 1.15–1.40)
Calcium, Ion: 1.2 mmol/L (ref 1.15–1.40)
HCT: 29 % — ABNORMAL LOW (ref 39.0–52.0)
HCT: 28 % — ABNORMAL LOW (ref 39.0–52.0)
Hemoglobin: 9.9 g/dL — ABNORMAL LOW (ref 13.0–17.0)
Hemoglobin: 9.5 g/dL — ABNORMAL LOW (ref 13.0–17.0)
O2 Saturation: 95 %
O2 Saturation: 97 %
Patient temperature: 97.9
Potassium: 4.2 mmol/L (ref 3.5–5.1)
Potassium: 4.4 mmol/L (ref 3.5–5.1)
Sodium: 139 mmol/L (ref 135–145)
Sodium: 138 mmol/L (ref 135–145)
TCO2: 39 mmol/L — ABNORMAL HIGH (ref 22–32)
TCO2: 40 mmol/L — ABNORMAL HIGH (ref 22–32)
pCO2 arterial: 72.6 mmHg (ref 32–48)
pCO2 arterial: 60.6 mmHg — ABNORMAL HIGH (ref 32–48)
pH, Arterial: 7.312 — ABNORMAL LOW (ref 7.35–7.45)
pH, Arterial: 7.4 (ref 7.35–7.45)
pO2, Arterial: 86 mmHg (ref 83–108)
pO2, Arterial: 95 mmHg (ref 83–108)

## 2021-08-19 LAB — CBG MONITORING, ED: Glucose-Capillary: 202 mg/dL — ABNORMAL HIGH (ref 70–99)

## 2021-08-19 LAB — GLUCOSE, CAPILLARY
Glucose-Capillary: 183 mg/dL — ABNORMAL HIGH (ref 70–99)
Glucose-Capillary: 218 mg/dL — ABNORMAL HIGH (ref 70–99)

## 2021-08-19 LAB — PROTIME-INR
INR: 1.7 — ABNORMAL HIGH (ref 0.8–1.2)
Prothrombin Time: 19.5 seconds — ABNORMAL HIGH (ref 11.4–15.2)

## 2021-08-19 IMAGING — DX DG CHEST DECUBITUS*L*
2 series · 2 of 2 positions shown · non-contrast
Comparison: Chest x-ray dated [DATE]

CLINICAL DATA: Pleural effusion

EXAM:
CHEST - LEFT DECUBITUS

[chest decu (1 of 2)]
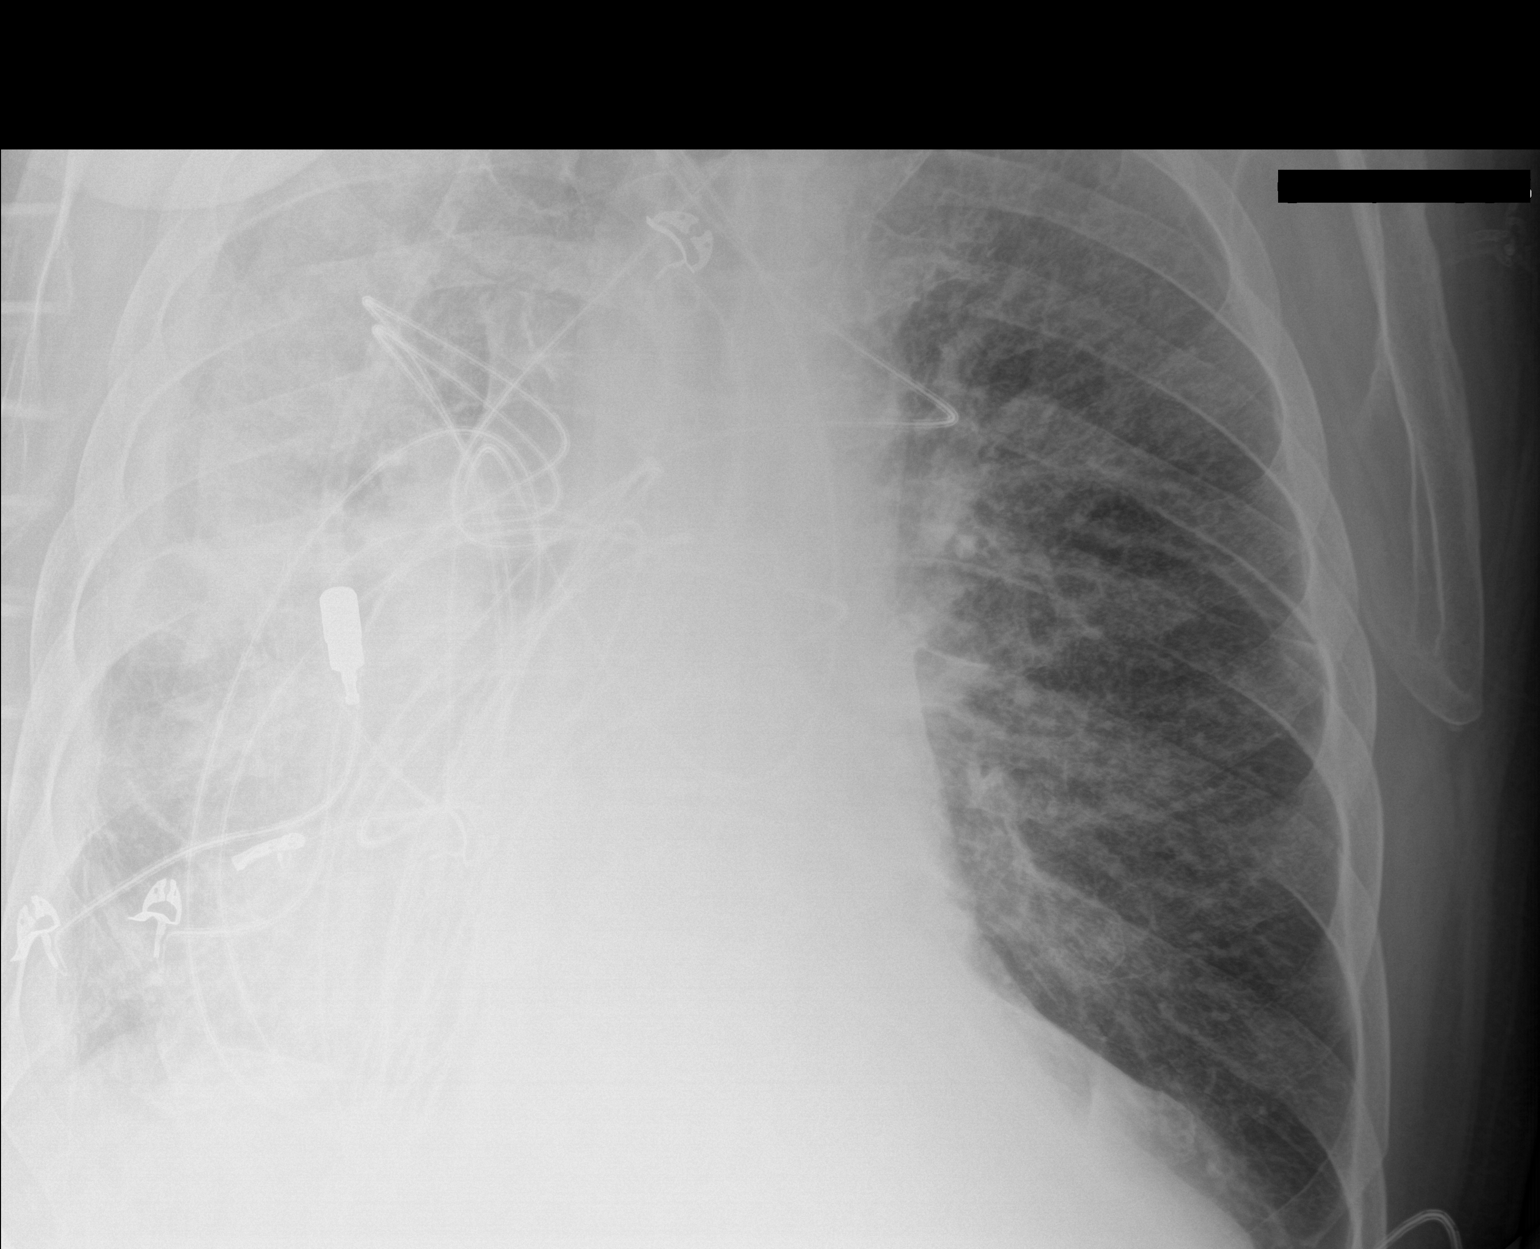

[chest decu (2 of 2)]
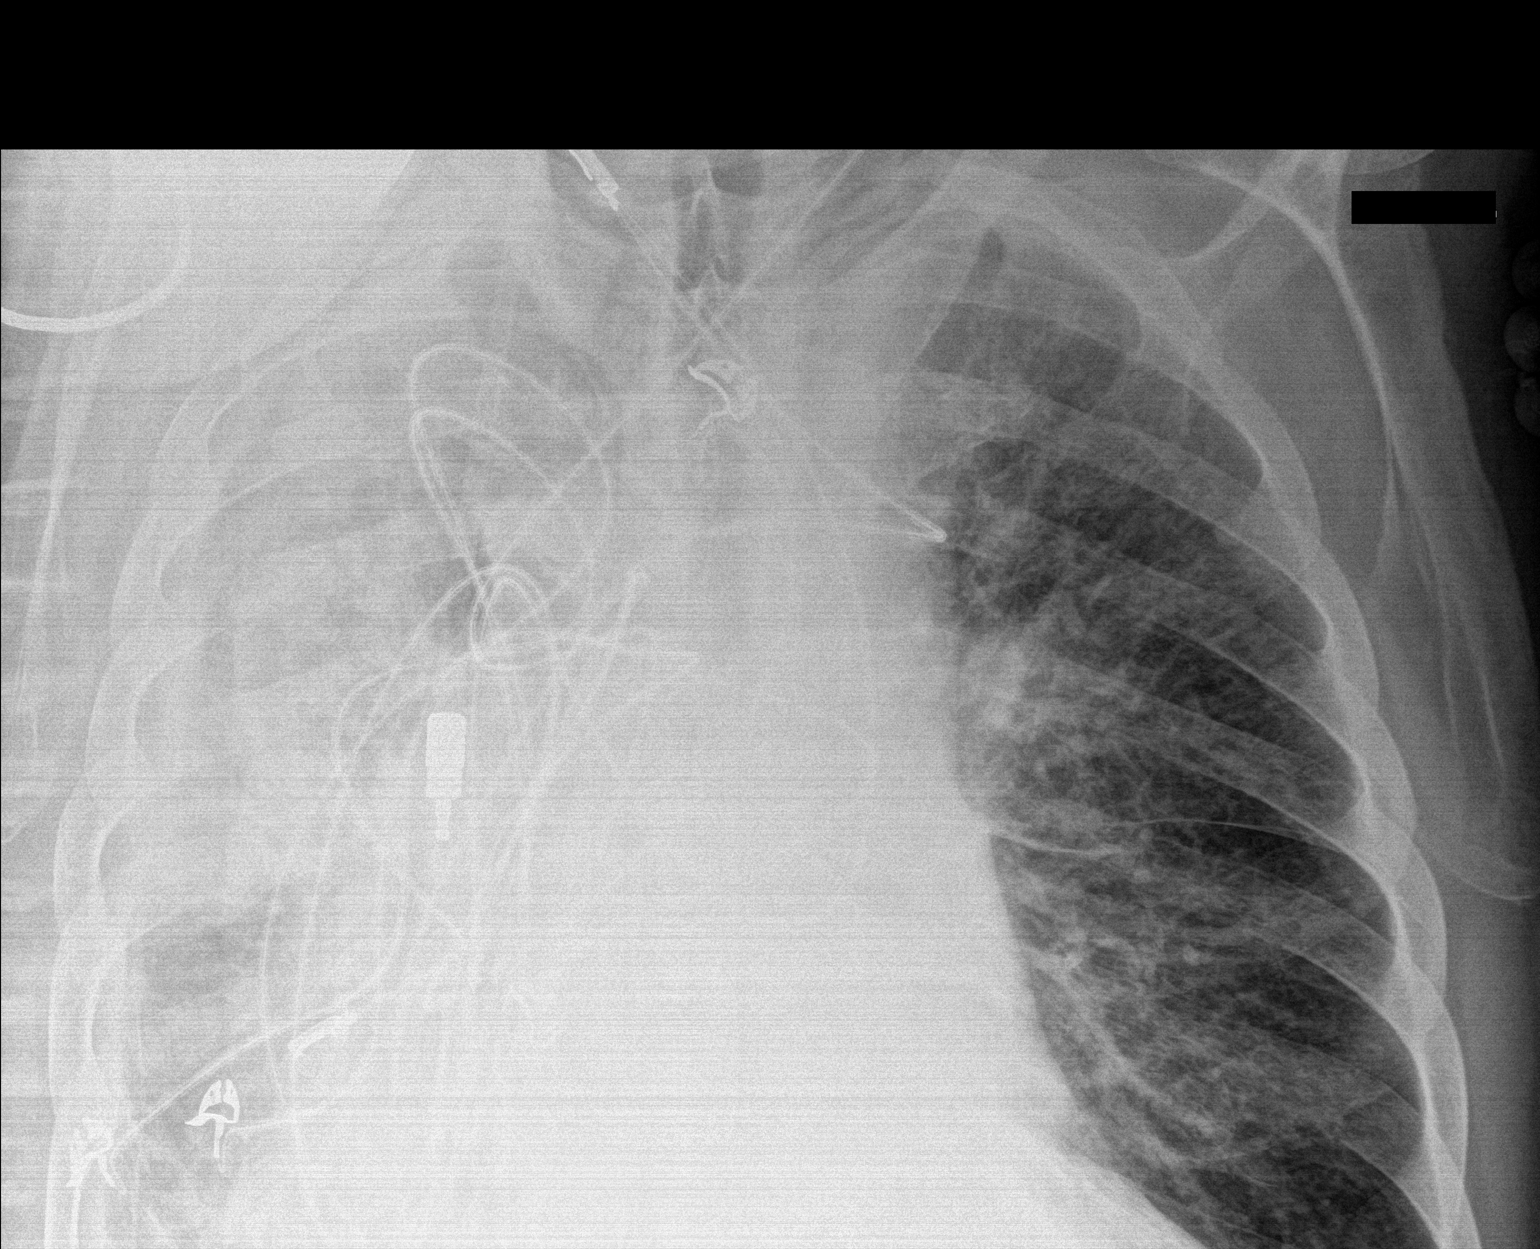

[2 of 2 positions shown; findings below may reference images not displayed]

FINDINGS: Left lateral decubitus views demonstrate a small loculatedpleural
effusion. Increased hazy opacification of the left hemithorax is
likely due to atelectasis. Cardiac and mediastinal contours are not
well visualized.
IMPRESSION: Small loculated left pleural effusion.

## 2021-08-19 IMAGING — DX DG CHEST 1V PORT
1 series · 1 of 1 positions shown · non-contrast
Comparison: [DATE]

CLINICAL DATA: Shortness of breath.

EXAM:
PORTABLE CHEST 1 VIEW

[chest]
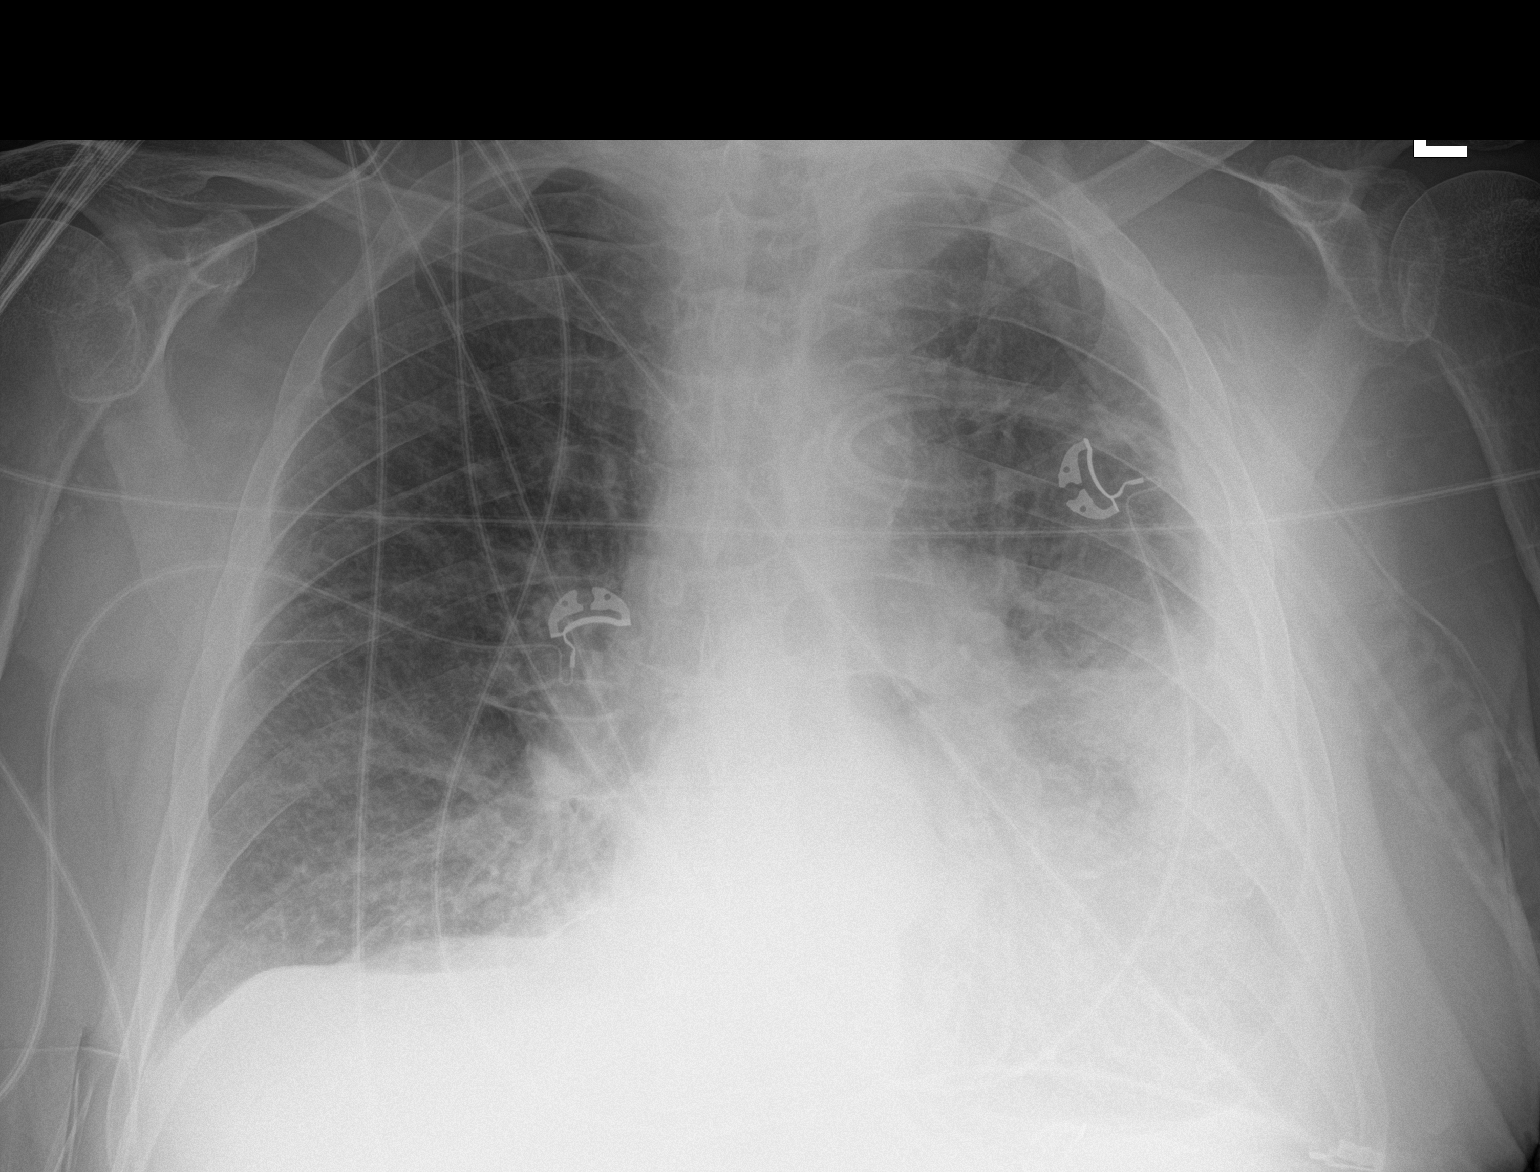

[1 of 1 positions shown; findings below may reference images not displayed]

FINDINGS: [CC] hours. Low volume film. Bibasilar collapse/consolidation again
noted, left greater than right. Small left and tiny right pleural
effusions noted. The cardio pericardial silhouette is enlarged.
Telemetry leads overlie the chest.
IMPRESSION: No substantial change. Bibasilar collapse/consolidation with small
left and tiny right pleural effusions.

## 2021-08-19 MED ORDER — IPRATROPIUM-ALBUTEROL 0.5-2.5 (3) MG/3ML IN SOLN: 3.0000 mL | Freq: Four times a day (QID) | RESPIRATORY_TRACT | Status: DC

## 2021-08-19 MED ORDER — WARFARIN SODIUM 2 MG PO TABS
4.0000 mg | ORAL_TABLET | Freq: Once | ORAL | Status: AC
  Administered 2021-08-19: 4 mg via ORAL
  Filled 2021-08-19: qty 1
  Filled 2021-08-19: qty 2

## 2021-08-19 MED ORDER — INSULIN ASPART 100 UNIT/ML IJ SOLN
0.0000 [IU] | Freq: Three times a day (TID) | INTRAMUSCULAR | Status: DC
  Administered 2021-08-19: 7 [IU] via SUBCUTANEOUS
  Administered 2021-08-19 – 2021-08-22 (×9): 4 [IU] via SUBCUTANEOUS
  Administered 2021-08-22: 3 [IU] via SUBCUTANEOUS
  Administered 2021-08-23: 4 [IU] via SUBCUTANEOUS
  Administered 2021-08-23: 3 [IU] via SUBCUTANEOUS
  Administered 2021-08-23 – 2021-08-24 (×3): 4 [IU] via SUBCUTANEOUS
  Administered 2021-08-24 – 2021-08-26 (×2): 3 [IU] via SUBCUTANEOUS
  Administered 2021-08-27 – 2021-08-28 (×5): 4 [IU] via SUBCUTANEOUS
  Administered 2021-08-29: 3 [IU] via SUBCUTANEOUS
  Administered 2021-08-29 – 2021-08-30 (×3): 4 [IU] via SUBCUTANEOUS
  Administered 2021-08-30 – 2021-08-31 (×2): 3 [IU] via SUBCUTANEOUS
  Administered 2021-09-03: 4 [IU] via SUBCUTANEOUS

## 2021-08-19 MED ORDER — ATORVASTATIN CALCIUM 80 MG PO TABS
80.0000 mg | ORAL_TABLET | Freq: Every day | ORAL | Status: DC
  Administered 2021-08-19 – 2021-08-25 (×7): 80 mg via ORAL
  Filled 2021-08-19 (×6): qty 1
  Filled 2021-08-19: qty 2

## 2021-08-19 MED ORDER — WARFARIN SODIUM 2.5 MG PO TABS: 2.5000 mg | ORAL_TABLET | Freq: Every day | ORAL | Status: DC

## 2021-08-19 MED ORDER — UMECLIDINIUM BROMIDE 62.5 MCG/ACT IN AEPB
1.0000 | INHALATION_SPRAY | Freq: Every day | RESPIRATORY_TRACT | Status: DC
  Administered 2021-08-21 – 2021-08-31 (×9): 1 via RESPIRATORY_TRACT
  Filled 2021-08-19: qty 7

## 2021-08-19 MED ORDER — CARVEDILOL 6.25 MG PO TABS
6.2500 mg | ORAL_TABLET | Freq: Two times a day (BID) | ORAL | Status: DC
  Administered 2021-08-19 – 2021-09-03 (×32): 6.25 mg via ORAL
  Filled 2021-08-19 (×18): qty 1
  Filled 2021-08-19: qty 2
  Filled 2021-08-19 (×13): qty 1

## 2021-08-19 MED ORDER — FLUTICASONE FUROATE-VILANTEROL 100-25 MCG/ACT IN AEPB
1.0000 | INHALATION_SPRAY | Freq: Every day | RESPIRATORY_TRACT | Status: DC
  Administered 2021-08-21 – 2021-09-03 (×12): 1 via RESPIRATORY_TRACT
  Filled 2021-08-19: qty 28

## 2021-08-19 MED ORDER — ALBUTEROL SULFATE (2.5 MG/3ML) 0.083% IN NEBU
10.0000 mg | INHALATION_SOLUTION | Freq: Once | RESPIRATORY_TRACT | Status: AC
  Administered 2021-08-19: 10 mg via RESPIRATORY_TRACT
  Filled 2021-08-19: qty 12

## 2021-08-19 MED ORDER — CLOPIDOGREL BISULFATE 75 MG PO TABS
75.0000 mg | ORAL_TABLET | Freq: Every day | ORAL | Status: DC
  Administered 2021-08-19 – 2021-08-25 (×7): 75 mg via ORAL
  Filled 2021-08-19 (×7): qty 1

## 2021-08-19 MED ORDER — INSULIN DETEMIR 100 UNIT/ML ~~LOC~~ SOLN
15.0000 [IU] | Freq: Every day | SUBCUTANEOUS | Status: DC
  Administered 2021-08-19 – 2021-09-02 (×15): 15 [IU] via SUBCUTANEOUS
  Filled 2021-08-19 (×17): qty 0.15

## 2021-08-19 MED ORDER — SODIUM CHLORIDE 0.9 % IV SOLN
2.0000 g | Freq: Two times a day (BID) | INTRAVENOUS | Status: DC
  Administered 2021-08-19 – 2021-08-20 (×4): 2 g via INTRAVENOUS
  Filled 2021-08-19 (×4): qty 12.5

## 2021-08-19 MED ORDER — PANTOPRAZOLE SODIUM 40 MG PO TBEC
40.0000 mg | DELAYED_RELEASE_TABLET | Freq: Two times a day (BID) | ORAL | Status: DC
  Administered 2021-08-19 – 2021-09-03 (×31): 40 mg via ORAL
  Filled 2021-08-19 (×31): qty 1

## 2021-08-19 MED ORDER — IPRATROPIUM-ALBUTEROL 0.5-2.5 (3) MG/3ML IN SOLN
3.0000 mL | RESPIRATORY_TRACT | Status: DC
  Administered 2021-08-19 (×2): 3 mL via RESPIRATORY_TRACT
  Filled 2021-08-19 (×3): qty 3
  Filled 2021-08-19: qty 9

## 2021-08-19 MED ORDER — WARFARIN - PHARMACIST DOSING INPATIENT: Freq: Every day | Status: DC

## 2021-08-19 MED ORDER — TRAZODONE HCL 50 MG PO TABS: 25.0000 mg | ORAL_TABLET | Freq: Every evening | ORAL | Status: DC | PRN

## 2021-08-19 MED ORDER — PREDNISONE 20 MG PO TABS: 10.0000 mg | ORAL_TABLET | Freq: Every day | ORAL | Status: DC

## 2021-08-19 MED ORDER — FERROUS SULFATE 325 (65 FE) MG PO TABS
325.0000 mg | ORAL_TABLET | ORAL | Status: DC
  Administered 2021-08-19: 325 mg via ORAL
  Filled 2021-08-19: qty 1

## 2021-08-19 MED ORDER — MAGNESIUM SULFATE IN D5W 1-5 GM/100ML-% IV SOLN
1.0000 g | Freq: Once | INTRAVENOUS | Status: AC
  Administered 2021-08-19: 1 g via INTRAVENOUS
  Filled 2021-08-19: qty 100

## 2021-08-19 MED ORDER — METHYLPREDNISOLONE SODIUM SUCC 125 MG IJ SOLR
125.0000 mg | INTRAMUSCULAR | Status: DC
  Administered 2021-08-19: 125 mg via INTRAVENOUS
  Filled 2021-08-19: qty 2

## 2021-08-19 NOTE — Progress Notes (Signed)
Pt taken off BiPAP by RT and placed on 3L [place]. Pt tolerating well at this time. MD at bedside, RT will continue to monitor.  ?

## 2021-08-19 NOTE — ED Triage Notes (Signed)
Pt arrive by [facility] from home for c/o increase SOB no responding to neb treatment at home. 125 mg Solumedrol and one Albuterol neb given pta with some relief pt 70% on 3L [place] and 99% on NRM at 15L but with labored breathing pt placed on CPAP by EMS pta with some relief. ?

## 2021-08-19 NOTE — Progress Notes (Addendum)
ANTICOAGULATION CONSULT NOTE - Initial Consult ? ?Pharmacy Consult for warfarin ?Indication: atrial fibrillation ? ?No Known Allergies ? ?Patient Measurements: ?Height: 6' (182.9 cm) ?Weight: 100.2 kg (220 lb 14.4 oz) ?IBW/kg (Calculated) : 77.6 ? ? ?Vital Signs: ?Temp: 97.9 ?F (36.6 ?C) (04/25 2025) ?Temp Source: Oral (04/25 4270) ?BP: 147/78 (04/25 1300) ?Pulse Rate: 81 (04/25 1300) ? ?Labs: ?Recent Labs  ?  08/19/21 ?6237 08/19/21 ?6283 08/19/21 ?1517 08/19/21 ?6160 08/19/21 ?1319  ?HGB 9.2* 9.9*  --   --  9.5*  ?HCT 30.3* 29.0*  --   --  28.0*  ?PLT 329  --   --   --   --   ?LABPROT  --   --   --  19.5*  --   ?INR  --   --   --  1.7*  --   ?CREATININE 2.15*  --   --   --   --   ?TROPONINIHS 36*  --  40*  --   --   ? ? ?Estimated Creatinine Clearance: 39.7 mL/min (A) (by C-G formula based on SCr of 2.15 mg/dL (H)). ? ? ?Medical History: ?Past Medical History:  ?Diagnosis Date  ? Acute combined systolic and diastolic congestive heart failure ([place])   ? Acute on chronic heart failure with preserved ejection fraction (HFpEF) ([place])   ? Acute respiratory failure with hypoxia ([place])   ? Aortic stenosis   ? moderate in 2022  ? Atrial fibrillation ([place])   ? CHF (congestive heart failure) ([place])   ? COPD exacerbation ([place]) 12/09/2020  ? Coronary artery disease   ? Demand ischemia ([place]) 03/26/2021  ? Diabetes mellitus without complication ([place])   ? HLD (hyperlipidemia)   ? Hypertension   ? Long term (current) use of anticoagulants 12/29/2019  ? Malnutrition of moderate degree 05/09/2021  ? Peripheral arterial disease ([place])   ? Pneumonia due to COVID-19 virus 05/29/2021  ? ? ?Assessment: ?71 yo M presenting with dyspnea 2/2 COPD exacerbation. PMH significant for afib on PTA warfarin 2.'5mg'$  daily. Pharmacy consulted for warfarin dosing.  ? ?4/25 INR 1.7, subtherapeutic ?Hgb 9.5 and plt 329. No overt s/s of bleeding noted.  ? ?Goal of Therapy:  ?Goal INR: 2-2.5 (decreased due to normocytic anemia in past) ?Monitor platelets by  anticoagulation protocol: Yes ?  ?Plan:  ?Warfarin '4mg'$  x 1 today ?Daily CBC and INR ?Monitor for s/s of bleeding ? ?Thank you for allowing pharmacy to participate in this patient's care. ? ?Levonne Spiller, PharmD ?PGY1 Acute Care Resident  ?08/19/2021,1:50 PM ? ? ? ?

## 2021-08-19 NOTE — H&P (Signed)
Family Medicine Teaching Service ?Hospital Admission History and Physical ?Service Pager: [phone] ? ?Patient name: Rodney [name] Medical record number: [id number] ?Date of birth: 1950/06/01 Age: 72 y.o. Gender: male ? ?Primary Care Provider: [name], MD ?Consultants: none ?Code Status: Full ?Preferred Emergency Contact: [place], significant other ? ?Chief Complaint: dyspnea ? ?Assessment and Plan: ?Rodney [name] is a 71 y.o. male presenting with dyspnea due to COPD exacerbaction x 1d. PMH is significant for DM 2, obesity, tobacco abuse, hypertension, protein calorie malnutrition, aortic stenosis, anemia chronic disease, PVD, HFrEF, PAF, status post BKA, CAD, CKD stage III, COPD ? ?COPD exacerbation with Acute on Chronic Respiratory Failure ?71 yo patient presents with acute on chronic respiratory failure due to COPD exacerbation. This exacerbation is severe due to respiratory distress; he denies any sputum production, but endorses severe dyspnea. Patient has a history of severe COPD with most recent admission within the last month (at that time COVID pneumonia). Today patient tests negative for COVID and flu, no leukocytosis or fever, no infiltrate identified on CXR. Patient required BIPAP for respiratory distress and had respiratory alkalosis on ABG with elevated pCO2 to 72.6, whereas on previous admissions pCO2 only 50s. Patient was recently treated with vancomycin and cefepime within the last 30 days, due to severe COPD exacerbation requiring 2nd admission in 1 month, will treat with cefepime due to risk factors for pseudomonas. Will also obtain sputum culture and gram stain; blood cultures obtained.  Will treat with Solu-Medrol 125 mg daily, total of 5 days of steroids. Patient does have a history of HFpEF, but dyspnea less likely due to this as BNP was half of last admission (692 vs 1079). On exam, wheezing in all lung fields, no crackles appreciated. Due to small pleural effusions in  CXR obtained in semi recumbent position, will obtain lateral decubitus CXR to evaluate for symptomatic treatment with thoracentesis.  Patient was on BiPAP for 4 hours, on my exam patient went into an NIV off, did a trial off of BiPAP.  Patient is on 3 L [place] O2 at home, currently he is on 5 L [place] O2 to maintain SPO2 between 88 and 92%. ?- Admit to progressive, FPTS, attending Dr. McDiarmid ?- Lateral decubitus CXR ?- Continuous pulse oximetry ?- Recheck ABG at 1300 ?- Follow-up blood culture ?- Obtain sputum culture and Gram stain ?- Strict I's and O's, ?- Daily weights, a.m. CBC, BMP, PT/INR ?- Maintain SPO2 greater than 88% and less than 92% ?- Cefepime 2 g every 12 hours ?- Solu-Medrol 125 mg daily x5 days ?- DuoNeb every 4 hours ?- Trelegy Ellipta daily ?- Patient is on full anticoagulation with warfarin, no DVT PPx required ?- Carb modified diet ?- PT/OT eval and treat ? ?AKI on CKD ?Baseline cr ranges from 1-1.4, serum creatinine today is elevated to 2.15.  Patient reports no change in his consumption of fluids prior to admission, he reports no problem urinating.  Suspect most likely cause is prerenal, will obtain urinalysis, FeUrea to evaluate.  Holding Lasix today. ?- A.m. BMP ?- Avoid nephrotoxic medications ?- UA, Urine urea and creatinine ? ?T2DM ?Last A1c 6.9% in November 2022.  Home medications include Levemir 15 units daily, metformin 1000 mg daily. Will hold metformin. ?- Moderate SSI for steroid treatment ?- CBG monitoring with meals and nightly ?- Continue home Levemir 15 units ?- Update A1c ? ?HFpEF  AS ?Last echo done June 23, 2021 showed preserved ejection fraction, 55 to 60%, with normal left ventricle  function, mild LVH, mildly elevated pulmonary artery systolic pressure, normal right ventricle systolic function, mildly dilated left and right atria, severe calcification of the aortic valve with severe thickening and severe stenosis.  Patient's home medications include Coreg 6.25 mg twice  daily, Lasix 40 mg daily.  Patient does have pitting edema 2+ in his right leg to mid shin, he reports that this is his baseline.  No JVD.  Patient has AKI, will hold Lasix, reassess tomorrow.  BNP is reassuringly reduced, from over thousand to 690, dyspnea unlikely to be heart failure related.  Patient does have severe aortic stenosis and was evaluated by cardiology at last admission for TAVR.  It was determined that he should follow-up outpatient for work-up related to aortic stenosis after he recovered from COVID-19.  This did not occur in the short interval last discharge, patient does not have COVID-19 today.  Patient's dyspnea was not related to movement, occurred at rest, best described by COPD, does not appear to be related to aortic stenosis. ?- Continue home Coreg ?- Hold Lasix, evaluate tomorrow ?- Consider cardiology consult regarding TAVR this admission ? 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  Today there is mild generalized edema over the right foot, no not only over the second toe.  There is no erythema or warmth over second toe, no obvious wounds, no purulent or pustulant drainage.  Labs are reassuring, no leukocytosis.  Blood cultures obtained. ?- Continue to monitor ?- A.m. CBC ?- Follow-up blood cultures ? ?PAF ?Chronic and stable, not currently in A-fib.  Patient had been on amiodarone 200 mg daily but this was held at his last admission as patient in atrial flutter last admission, however had been off anticoagulation due to GI bleed, and there was concern for chemical conversion in setting with no anticoagulation.  Patient started on Coumadin during last admission for outpatient anticoagulation due to history of DVT as well as A-fib/flutter.  The patient had been on Coumadin but stopped in January 2023 due to GI bleed and anemia at that time.  Patient reports no GI  bleeding, anemia stable today.  Patient was supposed to follow-up with cardiology outpatient, to discuss restarting amiodarone.  He is not in PAF today, but recommend consulting cardiology regarding PAF care during this admission. ?- Cardiac monitoring ?- Daily PT/INR ?- Continue Coumadin, appreciate pharmacy assistance with management ?- Consult cardiology ? ?PVD  CAD  s/p L BKA ?Medications include Plavix 75 mg daily and atorvastatin 80 mg daily ?- Continue home medications ? ?Normocytic Anemia, likely AoCD due to CKD ?Hemoglobin 9 on admission.  Baseline around 8.  Patient takes ferrous sulfate 325 mg daily. ?- A.m. CBC ?- Continue home iron ? ?GERD ?- Continue home Protonix 40 mg twice daily ? ?Insomnia ?- Can add trazodone 25 to 50 mg at bedtime as needed for sleep as this is his home medication ? ?Tobacco abuse ?Patient reports that he quit smoking cigarettes, declines nicotine patch ?Can give nicotine patch if quested ? ?FEN/GI: Carb modified ?Prophylaxis: warfarin ? ?Disposition: to progressive pending medical work up and stabilization ? ?History of Present Illness:  Rodney [name] is a 71 y.o. male presenting with dyspnea x 1 day.  Patient reports that he was in his usual state of health and could not sleep last night when he acutely became short of breath around 3 AM.  He used  his albuterol nebulizer and did not get relief, reported working very hard to breeze, so asked his significant other to call EMS.  He presented to the ED with diffuse wheezing, increased work of breathing and accessory muscle usage that did not improve with albuterol.  He was given magnesium sulfate via IV, placed on BiPAP.  He denies fever, chills, runny nose, cough, chest pain, N/V/D. ? ?In the ED, CXR appears similar to last CXR on 3/26 with bibasilar atelectasis and small left and right pleural effusions, no infiltrate identified. WBC WNL, BNP less than half of last admission, INR 1.7 (unlikely to be PE).  ABGs consistent  with respiratory alkalosis, some compensation, however likely acute component given elevated PCO2 to 72.6, he previously had pCO2 in the 50s on prior admissions.  Troponins elevated, but reassuringly flat for th

## 2021-08-19 NOTE — Progress Notes (Signed)
PT awake and alert. Found off bipap on [place] with no resp distress. PT states he doesn't want to wear tonight unless he is in distress. PT knows that he can call for it at any point. Will continue to monitor.  ?

## 2021-08-19 NOTE — Progress Notes (Signed)
FPTS Brief Progress Note ? ?S: Seen at bedside with Dr. Lurline Hare.  He reports feeling good, denies shortness of breath currently. ? ? ?O: ?BP 111/64 (BP Location: Left Arm)   Pulse 71   Temp 98 ?F (36.7 ?C) (Oral)   Resp (!) 23   Ht 6' (1.829 m)   Wt 96.1 kg   SpO2 96%   BMI 28.73 kg/m?   ?Appears comfortable, nonlabored respirations on 4 L nasal cannula ? ?A/P: ?Acute on chronic hypoxemic hypercapnic respiratory failure ?Being treated for COPD exacerbation.  Improving. ?- Orders reviewed. Labs for AM ordered, which was adjusted as needed.  ?- If condition changes, plan includes placed back on BiPAP if developing respiratory distress.  ? ?Zola Button, MD ?08/19/2021, 9:33 PM ?PGY-2, [place] Night Resident  ?Please page [phone] with questions.  ?  ?

## 2021-08-19 NOTE — ED Provider Notes (Signed)
?[facility] ?Provider Note ? ? ?CSN: [id number] ?Arrival date & time: 08/19/21  0530 ? ?  ? ?History ? ?Chief Complaint  ?Patient presents with  ? Shortness of Breath  ? ? ?Rodney [name] is a 71 y.o. male. ? ?HPI ? ?  ? ?This is a 71 year old male with a significant history of COPD who presents with shortness of breath.  Patient reports 1 hour history of worsening shortness of breath.  He was found to be hypoxic by EMS on nasal cannula.  He had significant work of breathing.  He was ultimately placed on CPAP.  He denies fevers or chest pain.  He is no longer smoking.  He was given Solu-Medrol and a neb in route which he states makes him feel better.  Denies lower extremity edema. ? ?Chart reviewed.  Patient tested positive for COVID-19 on his last admission in March 2023.  He was admitted for COPD exacerbation at that time. ? ?Home Medications ?Prior to Admission medications   ?Medication Sig Start Date End Date Taking? Authorizing Provider  ?acetaminophen (TYLENOL) 500 MG tablet Take 1,000 mg by mouth every 6 (six) hours as needed for mild pain.    [provider]  ?albuterol (VENTOLIN HFA) 108 (90 Base) MCG/ACT inhaler INHALE 2 PUFFS INTO THE LUNGS EVERY 6 (SIX) HOURS AS NEEDED FOR WHEEZING OR SHORTNESS OF BREATH. 04/24/21   [name], MD  ?atorvastatin (LIPITOR) 80 MG tablet TAKE 1 TABLET EVERY DAY 08/14/21   O'Neal, Cassie Freer, MD  ?Blood Glucose Monitoring Suppl (TRUE METRIX METER) DEVI Use to test blood sugar three times daily. 11/20/19   [name], MD  ?Blood Glucose Monitoring Suppl (TRUE METRIX METER) w/Device KIT USE AS DIRECTED 03/25/20   [name], MD  ?carvedilol (COREG) 12.5 MG tablet Take 6.25 mg by mouth 2 (two) times daily with a meal. 12/18/20   [provider]  ?clopidogrel (PLAVIX) 75 MG tablet TAKE 1 TABLET EVERY DAY WITH BREAKFAST ?Patient taking differently: Take 75 mg by mouth daily. 03/17/21   [name], MD  ?ferrous sulfate 325 (65 FE) MG tablet Take 1 tablet (325 mg total) by mouth every other day. ?Patient not taking: Reported on 07/20/2021 05/18/21   Shary Key, DO  ?fluticasone (FLONASE) 50 MCG/ACT nasal spray Place 2 sprays into both nostrils daily. 02/13/21   Simmons-Robinson, Makiera, MD  ?Fluticasone-Umeclidin-Vilant (TRELEGY ELLIPTA) 100-62.5-25 MCG/ACT AEPB Inhale 1 puff into the lungs daily. 06/01/21   Eppie Gibson, MD  ?furosemide (LASIX) 40 MG tablet Take 1 tablet (40 mg total) by mouth daily. 07/26/21   Erskine Emery, MD  ?gabapentin (NEURONTIN) 300 MG capsule Take 600 mg by mouth at bedtime.    [provider]  ?glucose blood (RELION TRUE METRIX TEST STRIPS) test strip Use to test blood sugar three times per day. 11/20/19   [name], MD  ?insulin detemir (LEVEMIR) 100 UNIT/ML FlexPen Inject 15 Units into the skin daily. 07/26/21   Erskine Emery, MD  ?Insulin Pen Needle 32G X 4 MM MISC Use to inject insulin up to 4 times daily as directed, 05/16/21     ?ipratropium-albuterol (DUONEB) 0.5-2.5 (3) MG/3ML SOLN Take 3 mLs by nebulization every 4 (four) hours as needed. ?Patient taking differently: Take 3 mLs by nebulization every 4 (four) hours as needed (shortness of breath). 05/19/21   [name], MD  ?lactose free nutrition (BOOST) LIQD Take 237 mLs by mouth 3 (three)  times a week.    [provider]  ?metFORMIN (GLUCOPHAGE) 1000 MG tablet Take 1 tablet (1,000 mg total) by mouth daily. 04/07/21   Lavina Hamman, MD  ?nicotine (NICODERM CQ - DOSED IN MG/24 HR) 7 mg/24hr patch Place 1 patch (7 mg total) onto the skin daily. ?Patient not taking: Reported on 07/20/2021 07/01/21   Shary Key, DO  ?pantoprazole (PROTONIX) 40 MG tablet Take 1 tablet (40 mg total) by mouth 2 (two) times daily. 08/12/21 09/11/21  McDiarmid, Blane Ohara, MD  ?traZODone (DESYREL) 50 MG tablet Take 0.5-1 tablets (25-50 mg total) by mouth at bedtime as needed for sleep. 05/19/21   [name], MD  ?TRUEplus Lancets 33G MISC Use to test blood sugar three times per day. ?Patient not taking: Reported on 05/07/2021 11/20/19   [name], MD  ?warfarin (COUMADIN) 2.5 MG tablet Take 1 tablet (2.5 mg total) by mouth daily. 07/29/21 07/29/22  Eppie Gibson, MD  ?   ? ?Allergies    ?Patient has no known allergies.   ? ?Review of Systems   ?Review of Systems  ?Constitutional:  Negative for fever.  ?Respiratory:  Positive for shortness of breath. Negative for cough.   ?Cardiovascular:  Negative for chest pain.  ?Gastrointestinal:  Negative for abdominal pain.  ?All other systems reviewed and are negative. ? ?Physical Exam ?Updated Vital Signs ?BP 139/88   Pulse (!) 106   Resp (!) 23   Ht 1.829 m (6')   Wt 100.2 kg   SpO2 95%   BMI 29.96 kg/m?  ?Physical Exam ?Vitals and nursing note reviewed.  ?Constitutional:   ?   Appearance: He is well-developed. He is obese.  ?   Comments: Chronically ill-appearing, increased work of breathing  ?HENT:  ?   Head: Normocephalic and atraumatic.  ?Eyes:  ?   Pupils: Pupils are equal, round, and reactive to light.  ?Cardiovascular:  ?   Rate and Rhythm: Regular rhythm. Tachycardia present.  ?   Heart sounds: Normal heart sounds. No murmur heard. ?Pulmonary:  ?   Effort: Tachypnea and respiratory distress present.  ?   Breath sounds: Examination of the right-upper field reveals wheezing. Examination of the left-upper field reveals wheezing. Wheezing present. No decreased breath sounds.  ?Abdominal:  ?   General: Bowel sounds are normal.  ?   Palpations: Abdomen is soft.  ?   Tenderness: There is no abdominal tenderness. There is no rebound.  ?Musculoskeletal:  ?   Cervical back: Neck supple.  ?   Comments: Left BKA ?Swelling noted of the right lower extremity up to the mid calf  ?Lymphadenopathy:  ?   Cervical: No cervical adenopathy.  ?Skin: ?   General: Skin is warm and dry.  ?Neurological:  ?   Mental Status: He is alert and oriented to person, place, and time.   ?Psychiatric:     ?   Mood and Affect: Mood normal.  ? ? ?ED Results / Procedures / Treatments   ?Labs ?(all labs ordered are listed, but only abnormal results are displayed) ?Labs Reviewed  ?CBC WITH DIFFERENTIAL/PLATELET - Abnormal; Notable for the following components:  ?    Result Value  ? RBC 3.11 (*)   ? Hemoglobin 9.2 (*)   ? HCT 30.3 (*)   ? RDW 16.8 (*)   ? All other components within normal limits  ?BASIC METABOLIC PANEL - Abnormal; Notable for the following components:  ? CO2 35 (*)   ?  Glucose, Bld 219 (*)   ? BUN 31 (*)   ? Creatinine, Ser 2.15 (*)   ? Calcium 8.5 (*)   ? GFR, Estimated 33 (*)   ? All other components within normal limits  ?BRAIN NATRIURETIC PEPTIDE - Abnormal; Notable for the following components:  ? B Natriuretic Peptide 692.1 (*)   ? All other components within normal limits  ?I-STAT ARTERIAL BLOOD GAS, ED - Abnormal; Notable for the following components:  ? pH, Arterial 7.312 (*)   ? pCO2 arterial 72.6 (*)   ? Bicarbonate 36.7 (*)   ? TCO2 39 (*)   ? Acid-Base Excess 9.0 (*)   ? HCT 29.0 (*)   ? Hemoglobin 9.9 (*)   ? All other components within normal limits  ?TROPONIN I (HIGH SENSITIVITY) - Abnormal; Notable for the following components:  ? Troponin I (High Sensitivity) 36 (*)   ? All other components within normal limits  ?RESP PANEL BY RT-PCR (FLU A&B, COVID) ARPGX2  ? ? ?EKG ?EKG Interpretation ? ?Date/Time:  Tuesday August 19 2021 05:38:30 EDT ?Ventricular Rate:  108 ?PR Interval:  152 ?QRS Duration: 111 ?QT Interval:  361 ?QTC Calculation: 484 ?R Axis:   47 ?Text Interpretation: Ectopic atrial tachycardia, unifocal Ventricular premature complex Repol abnrm, severe global ischemia (LM/MVD) ST depressions similar to prior EKG Confirmed by Thayer Jew [phone]) on 08/19/2021 5:43:15 AM ? ?Radiology ?DG Chest Portable 1 View ? ?Result Date: 08/19/2021 ?CLINICAL DATA:  Shortness of breath. EXAM: PORTABLE CHEST 1 VIEW COMPARISON:  07/20/2021 FINDINGS: 0541 hours. Low volume film.  Bibasilar collapse/consolidation again noted, left greater than right. Small left and tiny right pleural effusions noted. The cardio pericardial silhouette is enlarged. Telemetry leads overlie the chest. IMPRESS

## 2021-08-19 NOTE — Hospital Course (Addendum)
Rodney [name] is a 71 y.o.male with a history of COPD, HFpEF 2/2 severe aortic stenosis, anemia of chronic disease, CKD 3, PAD s/p BKA, CAD, heart failure with recovered ejection fraction, obesity, diabetes 2.  Who was admitted to the family medicine teaching Service at [facility] for COPD exacerbation. His hospital course is detailed below: ? ?COPD Exacerbation ?Patient presented in severe respiratory distress requiring BiPAP support in the emergency department.  ABG showed a PCO2 of 72.6.  Patient was started on cefepime and Solu-Medrol.  Cefepime was de-escalated to Cefdinir on 4/27.  He was transitioned from Solu-Medrol to p.o. prednisone on 4/26 and completed a total 5-day course.  Respiratory support was weaned from BiPAP to nasal cannula.  He was weaned to his home requirement of 2 L supplemental O2. ? ?Pleural effusions, bilateral ?Patient is not a chronic left sided pleural effusion since at least July 2022.  A CT chest was obtained on admission to better characterize the effusion and demonstrated a slight increase in size along with a new moderately sized right pleural effusion. This was improved, but still present.  ? ?Anemia of Chronic Kidney Disease ?Patient's hemoglobin on admission was 9.5.  It dropped to 7.6 on hospital day 2.  He received a 1 unit transfusion on 4/26 with recovery of his hemoglobin to 8.5.  Received IV iron during hospitalization.  Hemoglobin at discharge was stable at 8.6. ?  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 VVS amputated 2nd right digit with no complications on May 2nd.  ?-he has f/u appt with surgery on 09/17/2021 at 1420.   ?-continue daily dressing changes and heel weight bearing only. ?-will continue to check in periodically while still in the hospital. ? ?pAF, rate controlled and AC, stable ?Warfarin, Coreg ? ?Severe Aortic  Stenosis ?Cardiology was consulted and ultimately decided to proceed with TAVR work-up after discussing with vascular surgery in multidisciplinary heart valve team given his significant comorbidities including advanced chronic lung disease and severe peripheral arterial disease and multiple revascularization surgeries.  ?Work-up involved amputation see above.  ? ?C. Diff s/p fidoxamycin x10d (5/9) and resolved ?H/o recurrent c. diff ? ?Other chronic conditions were medically managed with home medications and formulary alternatives as necessary (GERD, insomnia, DM2, CKD 3) ? ?PCP Follow-up Recommendations: ?1. Increasing size of mediastinal lymph nodes, while potentially reactive show increase over time since previous imaging in the RIGHT paratracheal chain. Consider short interval follow-up or PET imaging ?for further evaluation outside of the acute setting, in 4-6 weeks. ?2. Given IV iron while hospitalized. Likely anemia of chronic disease. PCP to follow up.  ?3. Last work up is oral surgery before TAVR. Frederik Schmidt, MD consulted and agreed to OP f/u before TAVR. Needs transportation assistance, TOC was consulted.  ?4. Concerned for depression 2/2 medical problems, started on trazodone for sleep, increase to >'150mg'$  for depression or switching to 1st line SSRI or consider trazodone as adjunct to SSRI. Prozac is great for compliance.  ?

## 2021-08-20 DIAGNOSIS — E1122 Type 2 diabetes mellitus with diabetic chronic kidney disease: Secondary | ICD-10-CM

## 2021-08-20 DIAGNOSIS — S98111A Complete traumatic amputation of right great toe, initial encounter: Secondary | ICD-10-CM | POA: Diagnosis present

## 2021-08-20 DIAGNOSIS — N179 Acute kidney failure, unspecified: Secondary | ICD-10-CM

## 2021-08-20 DIAGNOSIS — N189 Chronic kidney disease, unspecified: Secondary | ICD-10-CM

## 2021-08-20 DIAGNOSIS — I480 Paroxysmal atrial fibrillation: Secondary | ICD-10-CM

## 2021-08-20 DIAGNOSIS — Z89512 Acquired absence of left leg below knee: Secondary | ICD-10-CM

## 2021-08-20 DIAGNOSIS — J9621 Acute and chronic respiratory failure with hypoxia: Secondary | ICD-10-CM

## 2021-08-20 DIAGNOSIS — I350 Nonrheumatic aortic (valve) stenosis: Secondary | ICD-10-CM | POA: Diagnosis not present

## 2021-08-20 DIAGNOSIS — J439 Emphysema, unspecified: Secondary | ICD-10-CM | POA: Diagnosis present

## 2021-08-20 DIAGNOSIS — D500 Iron deficiency anemia secondary to blood loss (chronic): Secondary | ICD-10-CM

## 2021-08-20 DIAGNOSIS — Z9289 Personal history of other medical treatment: Secondary | ICD-10-CM

## 2021-08-20 DIAGNOSIS — J441 Chronic obstructive pulmonary disease with (acute) exacerbation: Secondary | ICD-10-CM

## 2021-08-20 DIAGNOSIS — I700 Atherosclerosis of aorta: Secondary | ICD-10-CM | POA: Diagnosis present

## 2021-08-20 DIAGNOSIS — J432 Centrilobular emphysema: Secondary | ICD-10-CM

## 2021-08-20 DIAGNOSIS — I5020 Unspecified systolic (congestive) heart failure: Secondary | ICD-10-CM

## 2021-08-20 DIAGNOSIS — E1149 Type 2 diabetes mellitus with other diabetic neurological complication: Secondary | ICD-10-CM

## 2021-08-20 DIAGNOSIS — J90 Pleural effusion, not elsewhere classified: Secondary | ICD-10-CM

## 2021-08-20 DIAGNOSIS — J9622 Acute and chronic respiratory failure with hypercapnia: Secondary | ICD-10-CM

## 2021-08-20 DIAGNOSIS — I129 Hypertensive chronic kidney disease with stage 1 through stage 4 chronic kidney disease, or unspecified chronic kidney disease: Secondary | ICD-10-CM

## 2021-08-20 DIAGNOSIS — J9612 Chronic respiratory failure with hypercapnia: Secondary | ICD-10-CM

## 2021-08-20 DIAGNOSIS — J9611 Chronic respiratory failure with hypoxia: Secondary | ICD-10-CM

## 2021-08-20 LAB — HEMOGLOBIN AND HEMATOCRIT, BLOOD
HCT: 24.6 % — ABNORMAL LOW (ref 39.0–52.0)
HCT: 27.6 % — ABNORMAL LOW (ref 39.0–52.0)
Hemoglobin: 7.5 g/dL — ABNORMAL LOW (ref 13.0–17.0)
Hemoglobin: 8.5 g/dL — ABNORMAL LOW (ref 13.0–17.0)

## 2021-08-20 LAB — CBC
HCT: 24.9 % — ABNORMAL LOW (ref 39.0–52.0)
Hemoglobin: 7.6 g/dL — ABNORMAL LOW (ref 13.0–17.0)
MCH: 29.2 pg (ref 26.0–34.0)
MCHC: 30.5 g/dL (ref 30.0–36.0)
MCV: 95.8 fL (ref 80.0–100.0)
Platelets: 245 10*3/uL (ref 150–400)
RBC: 2.6 MIL/uL — ABNORMAL LOW (ref 4.22–5.81)
RDW: 16.5 % — ABNORMAL HIGH (ref 11.5–15.5)
WBC: 5.6 10*3/uL (ref 4.0–10.5)
nRBC: 0 % (ref 0.0–0.2)

## 2021-08-20 LAB — GLUCOSE, CAPILLARY
Glucose-Capillary: 194 mg/dL — ABNORMAL HIGH (ref 70–99)
Glucose-Capillary: 179 mg/dL — ABNORMAL HIGH (ref 70–99)
Glucose-Capillary: 192 mg/dL — ABNORMAL HIGH (ref 70–99)
Glucose-Capillary: 209 mg/dL — ABNORMAL HIGH (ref 70–99)

## 2021-08-20 LAB — BASIC METABOLIC PANEL
Anion gap: 6 (ref 5–15)
BUN: 34 mg/dL — ABNORMAL HIGH (ref 8–23)
CO2: 33 mmol/L — ABNORMAL HIGH (ref 22–32)
Calcium: 8.4 mg/dL — ABNORMAL LOW (ref 8.9–10.3)
Chloride: 98 mmol/L (ref 98–111)
Creatinine, Ser: 1.74 mg/dL — ABNORMAL HIGH (ref 0.61–1.24)
GFR, Estimated: 42 mL/min — ABNORMAL LOW (ref >60)
Glucose, Bld: 204 mg/dL — ABNORMAL HIGH (ref 70–99)
Potassium: 4.6 mmol/L (ref 3.5–5.1)
Sodium: 137 mmol/L (ref 135–145)

## 2021-08-20 LAB — PROTIME-INR
INR: 1.6 — ABNORMAL HIGH (ref 0.8–1.2)
Prothrombin Time: 18.5 seconds — ABNORMAL HIGH (ref 11.4–15.2)

## 2021-08-20 LAB — HEMOGLOBIN A1C
Hgb A1c MFr Bld: 5.9 % — ABNORMAL HIGH (ref 4.8–5.6)
Mean Plasma Glucose: 122.63 mg/dL

## 2021-08-20 LAB — PREPARE RBC (CROSSMATCH)

## 2021-08-20 MED ORDER — WARFARIN SODIUM 2.5 MG PO TABS
2.5000 mg | ORAL_TABLET | Freq: Once | ORAL | Status: AC
  Administered 2021-08-20: 2.5 mg via ORAL
  Filled 2021-08-20: qty 1

## 2021-08-20 MED ORDER — PREDNISONE 20 MG PO TABS
40.0000 mg | ORAL_TABLET | Freq: Every day | ORAL | Status: AC
  Administered 2021-08-20 – 2021-08-23 (×4): 40 mg via ORAL
  Filled 2021-08-20 (×4): qty 2

## 2021-08-20 MED ORDER — IPRATROPIUM-ALBUTEROL 0.5-2.5 (3) MG/3ML IN SOLN
3.0000 mL | Freq: Four times a day (QID) | RESPIRATORY_TRACT | Status: DC
  Administered 2021-08-20 – 2021-08-22 (×10): 3 mL via RESPIRATORY_TRACT
  Filled 2021-08-20 (×13): qty 3

## 2021-08-20 MED ORDER — IPRATROPIUM-ALBUTEROL 0.5-2.5 (3) MG/3ML IN SOLN
3.0000 mL | RESPIRATORY_TRACT | Status: DC | PRN
  Administered 2021-08-20 (×2): 3 mL via RESPIRATORY_TRACT
  Filled 2021-08-20 (×3): qty 3

## 2021-08-20 MED ORDER — FUROSEMIDE 40 MG PO TABS
40.0000 mg | ORAL_TABLET | Freq: Every day | ORAL | Status: DC
  Administered 2021-08-20 – 2021-08-27 (×7): 40 mg via ORAL
  Filled 2021-08-20 (×8): qty 1

## 2021-08-20 NOTE — Progress Notes (Signed)
ANTICOAGULATION CONSULT NOTE - Follow-Up Consult ? ?Pharmacy Consult for warfarin ?Indication: atrial fibrillation ? ?No Known Allergies ? ?Patient Measurements: ?Height: 6' (182.9 cm) ?Weight: 98.3 kg (216 lb 11.4 oz) ?IBW/kg (Calculated) : 77.6 ? ? ?Vital Signs: ?Temp: 97.4 ?F (36.3 ?C) (04/26 1211) ?Temp Source: Oral (04/26 1211) ?BP: 120/106 (04/26 1211) ?Pulse Rate: 86 (04/26 1211) ? ?Labs: ?Recent Labs  ?  08/19/21 ?5284 08/19/21 ?1324 08/19/21 ?4010 08/19/21 ?2725 08/19/21 ?1319 08/20/21 ?0235 08/20/21 ?3664  ?HGB 9.2*   < >  --   --  9.5* 7.6* 7.5*  ?HCT 30.3*   < >  --   --  28.0* 24.9* 24.6*  ?PLT 329  --   --   --   --  245  --   ?LABPROT  --   --   --  19.5*  --  18.5*  --   ?INR  --   --   --  1.7*  --  1.6*  --   ?CREATININE 2.15*  --   --   --   --  1.74*  --   ?TROPONINIHS 36*  --  40*  --   --   --   --   ? < > = values in this interval not displayed.  ? ? ? ?Estimated Creatinine Clearance: 48.7 mL/min (A) (by C-G formula based on SCr of 1.74 mg/dL (H)). ? ? ?Medical History: ?Past Medical History:  ?Diagnosis Date  ? Acute combined systolic and diastolic congestive heart failure ([place])   ? Acute on chronic heart failure with preserved ejection fraction (HFpEF) ([place])   ? Acute respiratory failure with hypoxia ([place])   ? Acute respiratory failure with hypoxia ([place])   ? Aortic stenosis   ? moderate in 2022  ? Atrial fibrillation ([place])   ? CHF (congestive heart failure) ([place])   ? Chronic pleural effusion, Left 11/16/2020  ? COPD exacerbation ([place]) 12/09/2020  ? Coronary artery disease   ? Demand ischemia ([place]) 03/26/2021  ? Diabetes mellitus without complication ([place])   ? HLD (hyperlipidemia)   ? Hypertension   ? Long term (current) use of anticoagulants 12/29/2019  ? Malnutrition of moderate degree 05/09/2021  ? Peripheral arterial disease ([place])   ? Pneumonia due to COVID-19 virus 05/29/2021  ? ? ?Assessment: ?71 yo M presenting with dyspnea 2/2 COPD exacerbation. PMH significant for afib on PTA warfarin 2.'5mg'$   daily. Pharmacy consulted for warfarin dosing.  ? ?4/26 INR 1.6, subtherapeutic ?Hgb 9.5 > 7.5 and plt 329 > 245. No overt s/s of bleeding noted per RN or MD.   Discussed with MD, ok to continue warfarin despite Hgb drop.  However, given Hgb drop, will reduce back to PTA warfarin dose of 2.'5mg'$  tonight.  Continue to monitor for bleeding. ? ?Goal of Therapy:  ?Goal INR: 2-2.5 (decreased due to normocytic anemia in past) ?Monitor platelets by anticoagulation protocol: Yes ?  ?Plan:  ?Warfarin 2.5 mg x 1 today ?Daily CBC and INR ?Monitor for s/s of bleeding ? ?Thank you for allowing pharmacy to participate in this patient's care. ? ?[employer], Pharm.D., BCPS ?Clinical Pharmacist ?Clinical phone for 08/20/2021 from 7:30-3:00 is [phone]. ? ?**Pharmacist phone directory can be found on [email or web address] listed under [place]. ? ?08/20/2021 12:18 PM ? ?

## 2021-08-20 NOTE — Consult Note (Signed)
? ?  [facility] CM Inpatient Consult ? ? ?08/20/2021 ? ?Rodney [name] ?1950/07/25 ?[id number] ? ?[facility] [ACO] Patient: [name] ? ?Primary Care Provider:  [name], MD, [place], is an embedded provider with a Chronic Care Management team and program, and is listed for the transition of care follow up and appointments. ? ?Patient with extreme high risk score for unplanned readmission with 6 admissions and less than 30 day readmission. ? ?Patient has been active with [name] CCM RN at practice for chronic care management noted and resources for transportation was noted ? ?Plan: To send follow up to the [name] Care Management team member and make aware of any additional TOC needs for post hospital needs.follow up. ? ?Please contact for further questions, ? ?[name], RN BSN CCM ?[facility] Liaison ? [phone] business mobile phone ?Toll free office [phone]  ?Fax number: [phone] ?[name] ? ? ? ?

## 2021-08-20 NOTE — Progress Notes (Signed)
FPTS Brief Progress Note ? ?S: Patient watching "[place]."  No concerns at this time.  Reports no breathing issues as long as he takes his nebulized treatments. ? ? ?O: ?BP 134/77 (BP Location: Left Arm)   Pulse 74   Temp 97.7 ?F (36.5 ?C) (Oral)   Resp (!) 21   Ht 6' (1.829 m)   Wt 98.3 kg   SpO2 95%   BMI 29.39 kg/m?   ?Breathing comfortably on 4 L nasal cannula ? ?A/P: ?Here for COPD exacerbation, idea of TAVR is being entertained. ?- Orders reviewed. Labs for AM ordered, which was adjusted as needed.  ?- If condition changes, plan includes give DuoNeb as needed for respiratory distress, consider placing back on BiPAP if needed.  ? ?Zola Button, MD ?08/20/2021, 9:55 PM ?PGY-2, [facility] Night Resident  ?Please page [phone] with questions.  ?  ?

## 2021-08-20 NOTE — Social Work (Addendum)
CSW spoke with pt about [facility] services, pt stated he has had [place] before with [facility] in the past. CSW attempted to contact [place] with [facility], no answer CSW left a message. ? ?CSW attempted to contact [place] again (x2) no answer. ? ?Alvis Lemmings will provide [facility] services to pt at DC. ? ?

## 2021-08-20 NOTE — Evaluation (Signed)
Occupational Therapy Evaluation Patient Details Name: Rodney [name] MRN: [id number] DOB: 09/18/1950 Today's Date: 08/20/2021   History of Present Illness Patient is a 71 yo male presenting to the ED with SOB on 08/19/21. Admitted same day with acute respiratory failure and COPD exacerbation. Patient with 4 admissions within the last 6 months.  PMH is significant for DM 2, obesity, tobacco abuse, hypertension, protein calorie malnutrition, aortic stenosis, anemia chronic disease, PVD, HFrEF, PAF, status post BKA, CAD, CKD stage III, COPD, COVID+   Clinical Impression   Prior to this admission patient living with girlfriend with girlfriend providing assistance with lower body ADLs, toileting, and IADLs. Patient with multiple admissions in the last 6 months, but since last admission in March 2023, patient has been working with HHPT 1x/week to increase strength and activity tolerance. Currently, patient willing to sit EOB and engage in exercises with PT and minimal lower body dressing (donning socks to L leg amputation site). Patient declining OOB mobility. Patient is likely close to his baseline, with no OT follow up recommended at this time, but recommending continuation of HHPT to increase strength, activity tolerance, and overall independence.      Recommendations for follow up therapy are one component of a multi-disciplinary discharge planning process, led by the attending physician.  Recommendations may be updated based on patient status, additional functional criteria and insurance authorization.   Follow Up Recommendations  No OT follow up (Continue with HHPT at home)    Assistance Recommended at Discharge Intermittent Supervision/Assistance  Patient can return home with the following A little help with walking and/or transfers;A little help with bathing/dressing/bathroom;Assistance with cooking/housework;Assist for transportation;Help with stairs or ramp for entrance    Functional  Status Assessment  Patient has had a recent decline in their functional status and demonstrates the ability to make significant improvements in function in a reasonable and predictable amount of time.  Equipment Recommendations  None recommended by OT (Patient has DME needed)    Recommendations for Other Services       Precautions / Restrictions Precautions Precautions: Fall Restrictions Weight Bearing Restrictions: No      Mobility Bed Mobility Overal bed mobility: Independent Bed Mobility: Supine to Sit, Sit to Supine           General bed mobility comments: Patient able to engage with no physical assist or extra effort needed    Transfers Overall transfer level: Needs assistance                 General transfer comment: patient declining transfers, but states he able to transfer to his w/c, on and off shower seat and toilet without issue      Balance Overall balance assessment: Needs assistance Sitting-balance support: Bilateral upper extremity supported, Feet supported Sitting balance-Leahy Scale: Fair         Standing balance comment: did not assess                           ADL either performed or assessed with clinical judgement   ADL Overall ADL's : Needs assistance/impaired Eating/Feeding: Set up;Sitting   Grooming: Set up;Sitting   Upper Body Bathing: Set up;Sitting   Lower Body Bathing: Moderate assistance;Sitting/lateral leans Lower Body Bathing Details (indicate cue type and reason): girlfriend assists at baseline Upper Body Dressing : Set up;Sitting   Lower Body Dressing: Moderate assistance;Sitting/lateral leans Lower Body Dressing Details (indicate cue type and reason): girlfriend assists at baseline Toilet Transfer:  Minimal assistance;Stand-pivot;BSC/3in1 Statistician Details (indicate cue type and reason): able to transfer to w/c at baseline but did not demonstrate for [place] to date Toileting- Architect and  Hygiene: Moderate assistance;Sitting/lateral lean Toileting - Clothing Manipulation Details (indicate cue type and reason): girlfriend assists at baseline     Functional mobility during ADLs: Moderate assistance General ADL Comments: Patient close to baseline per patient report, declining OOB mobility with PT/OT on eval     Vision Baseline Vision/History: 1 Wears glasses (Readers) Ability to See in Adequate Light: 0 Adequate Patient Visual Report: No change from baseline Vision Assessment?: No apparent visual deficits     Perception     Praxis      Pertinent Vitals/Pain Pain Assessment Pain Assessment: No/denies pain     Hand Dominance Right   Extremity/Trunk Assessment Upper Extremity Assessment Upper Extremity Assessment: Overall WFL for tasks assessed   Lower Extremity Assessment Lower Extremity Assessment: Defer to PT evaluation   Cervical / Trunk Assessment Cervical / Trunk Assessment: Kyphotic (Minimally)   Communication Communication Communication: No difficulties   Cognition Arousal/Alertness: Awake/alert Behavior During Therapy: Flat affect, WFL for tasks assessed/performed Overall Cognitive Status: Within Functional Limits for tasks assessed                                 General Comments: Minimally frustrated with evaluation, but able to crack some jokes if you play along with his sarcasm     General Comments  VSS throughout on 4L O2    Exercises     Shoulder Instructions      Home Living Family/patient expects to be discharged to:: Private residence Living Arrangements: Spouse/significant other Available Help at Discharge: Family;Available 24 hours/day Type of Home: Mobile home Home Access: Ramped entrance     Home Layout: One level     Bathroom Shower/Tub: Chief Strategy Officer: Standard Bathroom Accessibility: Yes   Home Equipment: Wheelchair - manual;Tub bench;BSC/3in1;Cane - single point;Rolling Walker (2  wheels);Hospital bed   Additional Comments: pt comfirms set up      Prior Functioning/Environment Prior Level of Function : Needs assist       Physical Assist : Mobility (physical) Mobility (physical): Transfers   Mobility Comments: says girlfriend asssits with transfers, primarily uses w/c for mobility ADLs Comments: states girlfriend assists with bathing and LB dressing as needed        OT Problem List: Decreased strength;Decreased range of motion;Decreased knowledge of use of DME or AE;Decreased coordination;Decreased activity tolerance;Cardiopulmonary status limiting activity;Impaired balance (sitting and/or standing);Pain;Impaired UE functional use      OT Treatment/Interventions: Self-care/ADL training;Therapeutic exercise;Patient/family education;Balance training;Therapeutic activities;DME and/or AE instruction    OT Goals(Current goals can be found in the care plan section) Acute Rehab OT Goals Patient Stated Goal: to get to feeling better and not jump through all these hoops OT Goal Formulation: With patient Time For Goal Achievement: 09/03/21 Potential to Achieve Goals: Good ADL Goals Pt Will Transfer to Toilet: with min assist;stand pivot transfer;squat pivot transfer;bedside commode Pt/caregiver will Perform Home Exercise Program: Increased ROM;Increased strength;Both right and left upper extremity;With written HEP provided;Independently;With theraband Additional ADL Goal #1: Patient will verbalize 3 strategies for fall prevention and energy conservation for safe discharge home.  OT Frequency: Min 2X/week    Co-evaluation              AM-PAC OT "6 Clicks" Daily Activity     Outcome  Measure Help from another person eating meals?: A Little Help from another person taking care of personal grooming?: A Little Help from another person toileting, which includes using toliet, bedpan, or urinal?: A Lot Help from another person bathing (including washing, rinsing,  drying)?: A Lot Help from another person to put on and taking off regular upper body clothing?: A Little Help from another person to put on and taking off regular lower body clothing?: A Lot 6 Click Score: 15   End of Session Equipment Utilized During Treatment: Oxygen Nurse Communication: Mobility status  Activity Tolerance: Patient limited by fatigue Patient left: in bed;with call bell/phone within reach  OT Visit Diagnosis: Muscle weakness (generalized) (M62.81)                Time: 0950-1006 OT Time Calculation (min): 16 min Charges:  OT General Charges $OT Visit: 1 Visit OT Evaluation $OT Eval Moderate Complexity: 1 Mod  Pollyann Glen E. Emma-Lee [name], OTR/L Acute Rehabilitation Services [phone]   Cherlyn Cushing 08/20/2021, 11:21 AM

## 2021-08-20 NOTE — Consult Note (Addendum)
?Cardiology Consultation:  ? ?Patient ID: Rodney [name] ?MRN: [id number]; DOB: 1951-02-07 ? ?Admit date: 08/19/2021 ?Date of Consult: 08/20/2021 ? ?PCP:  [name], MD ?  ?[facility] Providers ?Cardiologist:  Evalina Field, MD    ? ?Patient Profile:  ? ?Rodney [name] is a 71 y.o. male with a hx of CAD CTO of RCA, HFpEF, PAD s/p L BKA, HTN, HLD, severe AS, paroxsymal Afib/flutter, recurrent gangrene of the right foot who is being seen 08/20/2021 for the evaluation of Afib and AS at the request of Dr. McDiarmid. ? ?History of Present Illness:  ? ?[name] is a 71 year old male with past medical history noted above.  He has been followed by Dr. Davina Poke as an outpatient.  He has a history of PAD and underwent BKA in June 2021.  He developed atrial fibrillation postoperatively.  Echo at that time showed an LVEF of 35 to 40% with moderate aortic stenosis.  Underwent diagnostic cath which showed a CTO of RCA.  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  Underwent TEE guided cardioversion 09/2019.  He was unfortunately unable to afford his Eliquis.  He was seen in follow-up in July 2021 and was noted to be back in A-fib without anticoagulation. ? ?He was hospitalized 07/2020 with lower extremity blisters on the right side and gangrenous toes.  He was seen by vascular surgery for critical right lower extremity ischemia and underwent a new superficial femoral artery to below-knee popliteal bypass.  He was readmitted with swelling and blisters and found to have a DVT in the right lower extremity despite being on Coumadin.  It was felt this may have been precipitated by interruption of anticoagulation for surgery.  Again admitted July 2022 with concerns for impending bypass graft failure.  Vascular surgery was unable to redo vascularization and instead treated with stent of right SFA femoropopliteal bypass.  Also treated for osteomyelitis and underwent right great toe  amputation. ? ?Had an echocardiogram done in September 2022 which showed LVEF of 50 to 55%, moderate aortic stenosis with mean gradient of 35 mmHg. ? ?Most recently admitted 07/20/2021 for acute hypoxic respiratory failure in the setting of COVID PNA, along with chronic diastolic CHF.  It was noted his echocardiogram from 06/23/2021 indicated severe aortic stenosis with mean gradient of 43 mmHg.  During that admission it was recommended that he recover from [place] and recent vascular issues and be seen as an outpatient in the structural heart clinic to determine whether he is a TAVR candidate.  Last progress note in the hospital indicates he was in 3:1 atrial flutter at the time of discharge.  Recommendations to discharge on atorvastatin 80 mg daily, carvedilol 6.25 mg twice daily, Plavix 75 mg daily, Lasix 40 mg daily and Coumadin per pharmacy recommendations. ? ?He presented to the ED on 4/25 with complaints of worsening shortness of breath. Reports sudden onset while sitting on the couch watching TV. Told his girlfriend to call EMS as symptoms were so severe. Required Bipap on EMS arrival.  ? ?In the ED his labs showed sodium 140, potassium 4.4, creatinine 2.15, BNP 692, high-sensitivity troponin 36>> 40, WBC 9.2, hemoglobin 9.2, INR 1.7. COVID/flu negative. CXR with small bilateral pleural effusions, loculated on the left side.  CT chest with bilateral pleural effusions, loculated on the left side with possibility of underlying pneumonia, increasing mediastinal lymph nodes.  Recommendations to consider interval follow-up for pet imaging within 4 to 6 weeks.  EKG interpretation atrial tachycardia with discernable p waves, possibly 2:1 atrial flutter 108 bpm.  He required Bipap on admission, then weaned to [place]. Treated with antibiotics, steroids, and nebs and admitted to IM teaching service. Cardiology asked to evaluation regarding his severe aortic stenosis.  ? ?Past Medical History:  ?Diagnosis Date  ? Acute combined  systolic and diastolic congestive heart failure ([place])   ? Acute on chronic heart failure with preserved ejection fraction (HFpEF) ([place])   ? Acute respiratory failure with hypoxia ([place])   ? Acute respiratory failure with hypoxia ([place])   ? Aortic stenosis   ? moderate in 2022  ? Atrial fibrillation ([place])   ? CHF (congestive heart failure) ([place])   ? Chronic pleural effusion, Left 11/16/2020  ? COPD exacerbation ([place]) 12/09/2020  ? Coronary artery disease   ? Demand ischemia ([place]) 03/26/2021  ? Diabetes mellitus without complication ([place])   ? HLD (hyperlipidemia)   ? Hypertension   ? Long term (current) use of anticoagulants 12/29/2019  ? Malnutrition of moderate degree 05/09/2021  ? Peripheral arterial disease ([place])   ? Pneumonia due to COVID-19 virus 05/29/2021  ? ? ?Past Surgical History:  ?Procedure Laterality Date  ? ABDOMINAL AORTOGRAM W/LOWER EXTREMITY N/A 08/05/2020  ? Procedure: ABDOMINAL AORTOGRAM W/LOWER EXTREMITY;  Surgeon: Marty Heck, MD;  Location: [facility];  Service: Cardiovascular;  Laterality: N/A;  ? ABDOMINAL AORTOGRAM W/LOWER EXTREMITY N/A 11/13/2020  ? Procedure: ABDOMINAL AORTOGRAM W/LOWER EXTREMITY;  Surgeon: Cherre Robins, MD;  Location: [facility];  Service: Cardiovascular;  Laterality: N/A;  ? ABDOMINAL AORTOGRAM W/LOWER EXTREMITY N/A 05/12/2021  ? Procedure: ABDOMINAL AORTOGRAM W/LOWER EXTREMITY;  Surgeon: Waynetta Sandy, MD;  Location: [facility];  Service: Cardiovascular;  Laterality: N/A;  ? AMPUTATION Left 09/28/2019  ? Procedure: AMPUTATION BELOW KNEE;  Surgeon: Newt Minion, MD;  Location: [facility];  Service: Orthopedics;  Laterality: Left;  ? AMPUTATION Right 11/15/2020  ? Procedure: RIGHT GREAT TOE AMPUTATION;  Surgeon: Newt Minion, MD;  Location: [facility];  Service: Orthopedics;  Laterality: Right;  ? CARDIAC CATHETERIZATION    ? CARDIOVERSION N/A 10/05/2019  ? Procedure: CARDIOVERSION;  Surgeon: Sanda Klein, MD;  Location: [facility];   Service: Cardiovascular;  Laterality: N/A;  ? FEMORAL-POPLITEAL BYPASS GRAFT Right 08/07/2020  ? Procedure: RIGHT FEMORAL TO BELOW KNEE POPLITEAL ARTERY BYPASS;  Surgeon: Waynetta Sandy, MD;  Location: [facility];  Service: Vascular;  Laterality: Right;  ? LEFT HEART CATH AND CORONARY ANGIOGRAPHY N/A 10/03/2019  ? Procedure: LEFT HEART CATH AND CORONARY ANGIOGRAPHY;  Surgeon: Lorretta Harp, MD;  Location: [facility];  Service: Cardiovascular;  Laterality: N/A;  ? PERIPHERAL VASCULAR INTERVENTION Right 08/05/2020  ? Procedure: PERIPHERAL VASCULAR INTERVENTION;  Surgeon: Marty Heck, MD;  Location: [facility];  Service: Cardiovascular;  Laterality: Right;  common Iliac  ? PERIPHERAL VASCULAR INTERVENTION Left 11/13/2020  ? Procedure: PERIPHERAL VASCULAR INTERVENTION;  Surgeon: Cherre Robins, MD;  Location: [facility];  Service: Cardiovascular;  Laterality: Left;  ? PERIPHERAL VASCULAR INTERVENTION Right 11/14/2020  ? Procedure: PERIPHERAL VASCULAR INTERVENTION;  Surgeon: Marty Heck, MD;  Location: [facility];  Service: Cardiovascular;  Laterality: Right;  POP/SFA STENT  ? PERIPHERAL VASCULAR INTERVENTION  05/12/2021  ? Procedure: PERIPHERAL VASCULAR INTERVENTION;  Surgeon: Waynetta Sandy, MD;  Location: [facility];  Service: Cardiovascular;;  ? TEE WITHOUT CARDIOVERSION N/A 10/05/2019  ? Procedure: TRANSESOPHAGEAL ECHOCARDIOGRAM (  TEE);  Surgeon: Sanda Klein, MD;  Location: [facility];  Service: Cardiovascular;  Laterality: N/A;  ?  ? ?Home Medications:  ?Prior to Admission medications   ?Medication Sig Start Date End Date Taking? Authorizing Provider  ?acetaminophen (TYLENOL) 500 MG tablet Take 1,000 mg by mouth every 6 (six) hours as needed for mild pain.   Yes [provider]  ?albuterol (VENTOLIN HFA) 108 (90 Base) MCG/ACT inhaler INHALE 2 PUFFS INTO THE LUNGS EVERY 6 (SIX) HOURS AS NEEDED FOR WHEEZING OR SHORTNESS OF BREATH.  04/24/21  Yes Hensel, Jamal Collin, MD  ?atorvastatin (LIPITOR) 80 MG tablet TAKE 1 TABLET EVERY DAY ?Patient taking differently: Take 80 mg by mouth daily. 08/14/21  Yes O'Neal, Cassie Freer, MD  ?carvedilol (C

## 2021-08-20 NOTE — Progress Notes (Addendum)
Family Medicine Teaching Service ?Daily Progress Note ?Intern Pager: [phone] ? ?Patient name: Rodney [name] Medical record number: [id number] ?Date of birth: Sep 07, 1950 Age: 71 y.o. Gender: male ? ?Primary Care Provider: [name], MD ?Consultants: None ?Code Status: Full ? ?Pt Overview and Major Events to Date:  ?4/26- admitted ? ?Assessment and Plan: ?MICHIAH MUDRY is a 71 y.o. male presenting with acute on chronic hypoxic respiratory failure, presumed 2/2 COPD exacerbation. PMH is significant for DM 2, obesity, tobacco abuse, hypertension, protein calorie malnutrition, aortic stenosis, anemia chronic disease, PVD, HFrEF, PAF, status post BKA, CAD, CKD stage III, COPD ? ?Acute on Chronic Hypoxic Respiratory Failure Likely 2/2 COPD Exacerbation ?[name] did not require further BiPAP support overnight. Remained stable on 4L [place]. Remains without a fever or leukocytosis (WBC 5.6). CT chest obtained yesterday with bilateral pleural effusions with loculated appearance in L chest. Also with atelectasis nad chronic pleural calcification in the L chest. Feels much improved from a dyspnea standpoint this am.  ?- Sputum gram stain and culture to be collected ?- Continue prednisone '40mg'$  daily (day 2/5 steroids) ?- Continue cefepime given recent hospitalization with antibiotics, deescalate tomorrow if continuing to improve ?- Continue Breo + Incruse daily ?- Duonebs q6h ?- Adding PRN duonebs ?- Will need outpatient PET imaging for increasing size of mediastinal lymph nodes ?- O2 support to keep SPO2 88-92% ? ?Anemia ?Hgb 9.5>7.6. Transfusion threshold <8.0. Has a history of requiring multiple transfusions. ?- STAT recheck, type and screen ?- Will transfuse for recheck <8.0 ?- Will hold home PO iron to avoid masking a bleed, may benefit from IV iron prior to discharge ? ?AKI on CKDb  ?Cr 2.15>1.74. Baseline is 1.0-1.4. Lasix held yesterday.  ?- Restart Lasix today ?- Continue to trend ?- F/u UA and Urine  creatinine, urine urea ? ?T2DM ?A1c 5.9. Got home 15units LAI and 11units SAI yesterday. Anticipate rise in glucose with steroid therapy.  ?- CBGs, mSSI ?- Continue Levemir 15units daily ?- Given excellent A1c, will consider adjustment to home regimen upon discharge ? ?Heart Failure with Recovered EF  Aortic Stenosis, severe ?Does not appear to be fluid overloaded or in exacerbation. Patient has been unable to pursue outpatient work-up for possible TAVR. Cardiology consulted by admitting team yesterday, to see today. ?- F/u cardiology recs ?- Continue home Coreg ?- Re-starting home Lasix as discussed above ? ?Paroxysmal A Fib ?Patient is in A Fib this morning. Rate controlled on Coreg. ?- Continue home Coreg ?- Continue home coumadin, pharmacy assisting with dosing as necessary ?- Cardiology to see. ? 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 Patient not interested in amputation ?- monitoring toe ?- Continue home Plavix '75mg'$  daily ?- Atorvastatin '80mg'$  daily ? ?Chronic, stable conditions ?GERD- Continue home Protonix '40mg'$  BID ?Insomnia - Continue home trazodone PRN QHS ? ?FEN/GI: Carb-modified ?PPx: On Warfarin ?Dispo:Pending PT recommendations  in 2-3 days. Barriers include transfusion, clinical improvement.  ? ?Subjective:  ?Rodney [name] reports feeling better this morning compared to yesterday.  He is amenable to getting blood for his symptomatic anemia.  We also discussed having cardiology come by to talk to him about potential interventions for his aortic stenosis and A-fib. ? ?Objective: ?Temp:  [97.6 ?F (36.4 ?C)-98.5 ?F (36.9 ?C)] 97.6 ?F (36.4 ?C) (04/26 0400) ?Pulse Rate:  [71-106] 79 (04/26 0400) ?Resp:  [0-25] 17 (04/26 0400) ?BP: (104-173)/(64-137) 130/70 (04/26 0400) ?SpO2:  [92 %-100 %]  100 % (04/26 0400) ?FiO2 (%):  [50 %] 50 % (04/25 1033) ?Weight:  [96.1 kg-98.3 kg] 98.3 kg (04/26 0400) ?Physical Exam: ?General: Sleeping on arrival,  awakens easily to voice ?Cardiovascular: Irregularly irregular, 3/6 blowing systolic murmur ?Respiratory: Normal WOB on 4L [place], diffuse wheezing, no foci of crackles to my exam. L fields diminished posteriorly ?Abdomen: Obese, non-tender ?Extremities: S/p L BKA, R second toe with gangrenous changes, stable compared to my previous exams ? ?Laboratory: ?Recent Labs  ?Lab 08/19/21 ?6063 08/19/21 ?0160 08/19/21 ?1319 08/20/21 ?0235  ?WBC 9.2  --   --  5.6  ?HGB 9.2* 9.9* 9.5* 7.6*  ?HCT 30.3* 29.0* 28.0* 24.9*  ?PLT 329  --   --  245  ? ?Recent Labs  ?Lab 08/19/21 ?1093 08/19/21 ?2355 08/19/21 ?1319 08/20/21 ?0235  ?NA 140 139 138 137  ?K 4.4 4.2 4.4 4.6  ?CL 98  --   --  98  ?CO2 35*  --   --  33*  ?BUN 31*  --   --  34*  ?CREATININE 2.15*  --   --  1.74*  ?CALCIUM 8.5*  --   --  8.4*  ?GLUCOSE 219*  --   --  204*  ? ? ?Imaging/Diagnostic Tests: ?CT CHEST WO CONTRAST ?CLINICAL DATA:  Chronic dyspnea in a 71 year old male. ? ?EXAM: ?CT CHEST WITHOUT CONTRAST ? ?TECHNIQUE: ?Multidetector CT imaging of the chest was performed following the ?standard protocol without IV contrast. ? ?RADIATION DOSE REDUCTION: This exam was performed according to the ?departmental dose-optimization program which includes automated ?exposure control, adjustment of the mA and/or kV according to ?patient size and/or use of iterative reconstruction technique. ? ?COMPARISON:  September 28, 2019. Recent chest x-rays from February March ?of 2023. ? ?FINDINGS: ?Cardiovascular: Calcified atheromatous plaque of the thoracic aorta. ?No aneurysmal dilation. Mildly enlarged heart. No substantial ?pericardial effusion. Mitral annular calcifications. Three-vessel ?coronary artery disease. Central pulmonary vasculature at 3.5 cm. ? ?Mediastinum/Nodes: Enlarged lymph nodes along the RIGHT paratracheal ?chain, for example (image 48/3) 16 mm short axis. No thoracic inlet ?adenopathy. No axillary lymphadenopathy. Other lymph nodes aside ?from subcarinal lymph nodes  are at or less than a cm. Subcarinal ?nodal tissue measuring up to 18 mm short axis. No gross hilar ?adenopathy. ? ?Lungs/Pleura: Loculated pleural fluid in the LEFT chest. ?Calcifications and or added density along the pleural surface in the ?LEFT chest is grossly similar to the prior study the volume of ?pleural fluid and or pleural thickening throughout the chest has ?increased in size. Fissural fluid in the LEFT mid chest in the major ?fissure measuring 4.8 x 3.6 cm. Loculated fluid along the posterior ?LEFT chest in the LEFT upper chest along the upper lobe measuring ?3.9 x 2.3 cm. ? ?Moderately large RIGHT pleural effusion with sub pulmonic component ?in dependent component, associated airspace disease in both the LEFT ?and RIGHT chest with background of paraseptal emphysema towards the ?lung apices. Increasing airspace process in the lingula with signs ?of distortion of adjacent pulmonary vessels (image 77/4) airways are ?patent. ? ?Upper Abdomen: Sludge layering in the gallbladder. Smooth hepatic ?contours. Imaged portions of pancreas, adrenal glands and kidneys ?are unremarkable. ? ?No acute upper abdominal findings. ? ?Musculoskeletal: No acute bone finding. No destructive bone process. ?Spinal degenerative changes. ? ?IMPRESSION: ?1. Increasing bilateral pleural effusions with loculated appearance ?in the LEFT chest. Difficult to exclude the possibility of ?underlying pneumonia given concomitant airspace process in the RIGHT ?lung base though this appears more compatible with volume loss. ?  2. Suspect developing rounded atelectasis in the lingula and LEFT ?upper lobe. Chronic pleural calcification in the LEFT chest is ?unilateral in may be related to prior infection or trauma. This may ?partially explain chronic parenchymal changes and developing ?suspected rounded atelectasis. ?3. Increasing size of mediastinal lymph nodes, while potentially ?reactive show increase over time since previous imaging in the  RIGHT ?paratracheal chain. Consider short interval follow-up or PET imaging ?for further evaluation outside of the acute setting, in 4-6 weeks. ? ?Aortic Atherosclerosis (ICD10-I70.0) and Emphysema (ICD10-J43.9).

## 2021-08-20 NOTE — Evaluation (Signed)
Physical Therapy Evaluation ?Patient Details ?Name: Rodney [name] ?MRN: [id number] ?DOB: 12/22/1950 ?Today's Date: 08/20/2021 ? ?History of Present Illness ? Patient is a 71 yo male presenting to the ED with SOB on 08/19/21. Admitted same day with acute respiratory failure and COPD exacerbation. Patient with 4 admissions within the last 6 months.  PMH is significant for DM 2, obesity, tobacco abuse, hypertension, protein calorie malnutrition, aortic stenosis, anemia chronic disease, PVD, HFrEF, PAF, status post BKA, CAD, CKD stage III, COPD, COVID+  ?Clinical Impression ? PTA pt living with wife and son in single story home with ramped entrance. Pt reports independence in transfer to wheelchair and for propulsion of wheelchair. Pt's wife assists with bathing, lower body dressing and toileting. Family provides for iADLs. Pt is limited in safe mobility by increased O2 demand and generalized weakness. Pt is currently independent for bed mobility and refuses transfer to recliner. Pt would benefit from return to HHPT at discharge. PT will continue to follow acutely. ?   ? ?Recommendations for follow up therapy are one component of a multi-disciplinary discharge planning process, led by the attending physician.  Recommendations may be updated based on patient status, additional functional criteria and insurance authorization. ? ?Follow Up Recommendations Home health PT ? ?  ?Assistance Recommended at Discharge Frequent or constant Supervision/Assistance  ?Patient can return home with the following ? A lot of help with walking and/or transfers;A little help with bathing/dressing/bathroom;Assistance with cooking/housework;Assistance with feeding;Direct supervision/assist for medications management;Direct supervision/assist for financial management;Assist for transportation;Help with stairs or ramp for entrance ? ?  ?Equipment Recommendations None recommended by PT  ?Recommendations for Other Services ?    ?  ?Functional  Status Assessment Patient has not had a recent decline in their functional status  ? ?  ?Precautions / Restrictions Precautions ?Precautions: Fall ?Restrictions ?Weight Bearing Restrictions: No  ? ?  ? ?Mobility ? Bed Mobility ?Overal bed mobility: Independent ?Bed Mobility: Supine to Sit, Sit to Supine ?  ?  ?  ?  ?  ?General bed mobility comments: Patient able to engage with no physical assist or extra effort needed ?  ? ?Transfers ?Overall transfer level: Needs assistance ?  ?  ?  ?  ?  ?  ?  ?  ?General transfer comment: patient declining transfers, but states he able to transfer to his w/c, on and off shower seat and toilet without issue ?  ? ? ?  ? ?Balance Overall balance assessment: Needs assistance ?Sitting-balance support: Bilateral upper extremity supported, Feet supported ?Sitting balance-Leahy Scale: Fair ?  ?  ?  ?  ?Standing balance comment: did not assess ?  ?  ?  ?  ?  ?  ?  ?  ?  ?  ?  ?   ? ? ? ?Pertinent Vitals/Pain Pain Assessment ?Pain Assessment: No/denies pain  ? ? ?Home Living Family/patient expects to be discharged to:: Private residence ?Living Arrangements: Spouse/significant other ?Available Help at Discharge: Family;Available 24 hours/day ?Type of Home: Mobile home ?Home Access: Ramped entrance ?  ?  ?  ?Home Layout: One level ?Home Equipment: Wheelchair - manual;Tub bench;BSC/3in1;Cane - single point;Rolling Walker (2 wheels);Hospital bed ?Additional Comments: pt comfirms set up  ?  ?Prior Function Prior Level of Function : Needs assist ?  ?  ?  ?Physical Assist : Mobility (physical) ?Mobility (physical): Transfers ?  ?Mobility Comments: says girlfriend asssits with transfers, primarily uses w/c for mobility ?ADLs Comments: states girlfriend assists with bathing and LB dressing  as needed ?  ? ? ?Hand Dominance  ? Dominant Hand: Right ? ?  ?Extremity/Trunk Assessment  ? Upper Extremity Assessment ?Upper Extremity Assessment: Defer to OT evaluation ?  ? ?Lower Extremity Assessment ?Lower  Extremity Assessment: RLE deficits/detail;LLE deficits/detail ?RLE Deficits / Details: R hip WFL, knee lacking approx 5 degrees extension and 10 degrees of flexion, sensa ?RLE Sensation: history of peripheral neuropathy ?LLE Deficits / Details: L BKA, knee lacking approx 15 degrees of extension and 20 degrees of flexion, hip ROM WFL, strength in hip grossly 3+/5, knee 3+/5 ?  ? ?Cervical / Trunk Assessment ?Cervical / Trunk Assessment: Kyphotic (Minimally)  ?Communication  ? Communication: No difficulties  ?Cognition Arousal/Alertness: Awake/alert ?Behavior During Therapy: Flat affect, WFL for tasks assessed/performed ?Overall Cognitive Status: Within Functional Limits for tasks assessed ?  ?  ?  ?  ?  ?  ?  ?  ?  ?  ?  ?  ?  ?  ?  ?  ?General Comments: Minimally frustrated with evaluation, but able to crack some jokes if you play along with his sarcasm ?  ?  ? ?  ?General Comments General comments (skin integrity, edema, etc.): VSS throughout on 4L O2 ? ?  ?Exercises General Exercises - Lower Extremity ?Long Arc Quad: AROM, Both, 10 reps, Seated ?Hip Flexion/Marching: AROM, Both, 10 reps, Seated  ? ?Assessment/Plan  ?  ?PT Assessment Patient needs continued PT services  ?PT Problem List Decreased strength;Decreased range of motion;Decreased activity tolerance;Decreased balance;Decreased mobility;Decreased coordination;Decreased cognition;Decreased safety awareness;Cardiopulmonary status limiting activity;Impaired sensation;Pain ? ?   ?  ?PT Treatment Interventions Functional mobility training;Therapeutic activities;Therapeutic exercise;Patient/family education;Wheelchair mobility training;DME instruction;Gait training;Balance training   ? ?PT Goals (Current goals can be found in the Care Plan section)  ?Acute Rehab PT Goals ?Patient Stated Goal: get back home ?PT Goal Formulation: With patient ?Time For Goal Achievement: 09/03/21 ?Potential to Achieve Goals: Fair ? ?  ?Frequency Min 3X/week ?  ? ? ?Co-evaluation    ?  ?  ?  ?  ? ? ?  ?AM-PAC PT "6 Clicks" Mobility  ?Outcome Measure Help needed turning from your back to your side while in a flat bed without using bedrails?: None ?Help needed moving from lying on your back to sitting on the side of a flat bed without using bedrails?: None ?Help needed moving to and from a bed to a chair (including a wheelchair)?: A Little ?Help needed standing up from a chair using your arms (e.g., wheelchair or bedside chair)?: Total ?Help needed to walk in hospital room?: Total ?Help needed climbing 3-5 steps with a railing? : Total ?6 Click Score: 14 ? ?  ?End of Session Equipment Utilized During Treatment: Oxygen ?Activity Tolerance: Patient tolerated treatment well ?Patient left: in bed;with call bell/phone within reach;with bed alarm set ?Nurse Communication: Mobility status ?PT Visit Diagnosis: Muscle weakness (generalized) (M62.81);Other abnormalities of gait and mobility (R26.89) ?  ? ?Time: 0950-1008 ?PT Time Calculation (min) (ACUTE ONLY): 18 min ? ? ?Charges:   PT Evaluation ?$PT Eval Moderate Complexity: 1 Mod ?  ?  ?   ? ? ?Kwame Ryland B. Migdalia Dk PT, DPT ?Acute Rehabilitation Services ?Pager [phone] ?Office [phone] ? ? ?[place] ?08/20/2021, 1:40 PM ? ?

## 2021-08-21 ENCOUNTER — Inpatient Hospital Stay (HOSPITAL_COMMUNITY): Payer: Medicare PPO

## 2021-08-21 ENCOUNTER — Ambulatory Visit: Payer: Medicare PPO

## 2021-08-21 DIAGNOSIS — N179 Acute kidney failure, unspecified: Secondary | ICD-10-CM | POA: Diagnosis not present

## 2021-08-21 DIAGNOSIS — I5043 Acute on chronic combined systolic (congestive) and diastolic (congestive) heart failure: Secondary | ICD-10-CM | POA: Diagnosis not present

## 2021-08-21 DIAGNOSIS — I96 Gangrene, not elsewhere classified: Secondary | ICD-10-CM | POA: Diagnosis not present

## 2021-08-21 DIAGNOSIS — I350 Nonrheumatic aortic (valve) stenosis: Secondary | ICD-10-CM | POA: Diagnosis not present

## 2021-08-21 DIAGNOSIS — L0390 Cellulitis, unspecified: Secondary | ICD-10-CM

## 2021-08-21 DIAGNOSIS — I739 Peripheral vascular disease, unspecified: Secondary | ICD-10-CM | POA: Diagnosis not present

## 2021-08-21 DIAGNOSIS — I2510 Atherosclerotic heart disease of native coronary artery without angina pectoris: Secondary | ICD-10-CM

## 2021-08-21 DIAGNOSIS — I480 Paroxysmal atrial fibrillation: Secondary | ICD-10-CM | POA: Diagnosis not present

## 2021-08-21 DIAGNOSIS — J441 Chronic obstructive pulmonary disease with (acute) exacerbation: Secondary | ICD-10-CM | POA: Diagnosis not present

## 2021-08-21 DIAGNOSIS — J9621 Acute and chronic respiratory failure with hypoxia: Secondary | ICD-10-CM | POA: Diagnosis not present

## 2021-08-21 LAB — GLUCOSE, CAPILLARY
Glucose-Capillary: 177 mg/dL — ABNORMAL HIGH (ref 70–99)
Glucose-Capillary: 185 mg/dL — ABNORMAL HIGH (ref 70–99)
Glucose-Capillary: 177 mg/dL — ABNORMAL HIGH (ref 70–99)
Glucose-Capillary: 224 mg/dL — ABNORMAL HIGH (ref 70–99)

## 2021-08-21 LAB — BASIC METABOLIC PANEL
Anion gap: 6 (ref 5–15)
BUN: 37 mg/dL — ABNORMAL HIGH (ref 8–23)
CO2: 33 mmol/L — ABNORMAL HIGH (ref 22–32)
Calcium: 8.5 mg/dL — ABNORMAL LOW (ref 8.9–10.3)
Chloride: 97 mmol/L — ABNORMAL LOW (ref 98–111)
Creatinine, Ser: 1.79 mg/dL — ABNORMAL HIGH (ref 0.61–1.24)
GFR, Estimated: 41 mL/min — ABNORMAL LOW (ref >60)
Glucose, Bld: 168 mg/dL — ABNORMAL HIGH (ref 70–99)
Potassium: 4.5 mmol/L (ref 3.5–5.1)
Sodium: 136 mmol/L (ref 135–145)

## 2021-08-21 LAB — CBC
HCT: 26.2 % — ABNORMAL LOW (ref 39.0–52.0)
Hemoglobin: 8.2 g/dL — ABNORMAL LOW (ref 13.0–17.0)
MCH: 29.2 pg (ref 26.0–34.0)
MCHC: 31.3 g/dL (ref 30.0–36.0)
MCV: 93.2 fL (ref 80.0–100.0)
Platelets: 247 10*3/uL (ref 150–400)
RBC: 2.81 MIL/uL — ABNORMAL LOW (ref 4.22–5.81)
RDW: 17.1 % — ABNORMAL HIGH (ref 11.5–15.5)
WBC: 7.7 10*3/uL (ref 4.0–10.5)
nRBC: 0 % (ref 0.0–0.2)

## 2021-08-21 LAB — PROTIME-INR
INR: 1.8 — ABNORMAL HIGH (ref 0.8–1.2)
Prothrombin Time: 20.8 seconds — ABNORMAL HIGH (ref 11.4–15.2)

## 2021-08-21 LAB — TYPE AND SCREEN
ABO/RH(D): A POS
Antibody Screen: NEGATIVE
Unit division: 0

## 2021-08-21 LAB — BPAM RBC
Blood Product Expiration Date: 202305092359
ISSUE DATE / TIME: 202304260845
Unit Type and Rh: 6200

## 2021-08-21 LAB — MAGNESIUM: Magnesium: 1.6 mg/dL — ABNORMAL LOW (ref 1.7–2.4)

## 2021-08-21 IMAGING — DX DG ORTHOPANTOGRAM /PANORAMIC
1 series · 1 of 1 positions shown · non-contrast
Comparison: None.

CLINICAL DATA: Preoperative testing

EXAM:
ORTHOPANTOGRAM/PANORAMIC

[view not recorded]
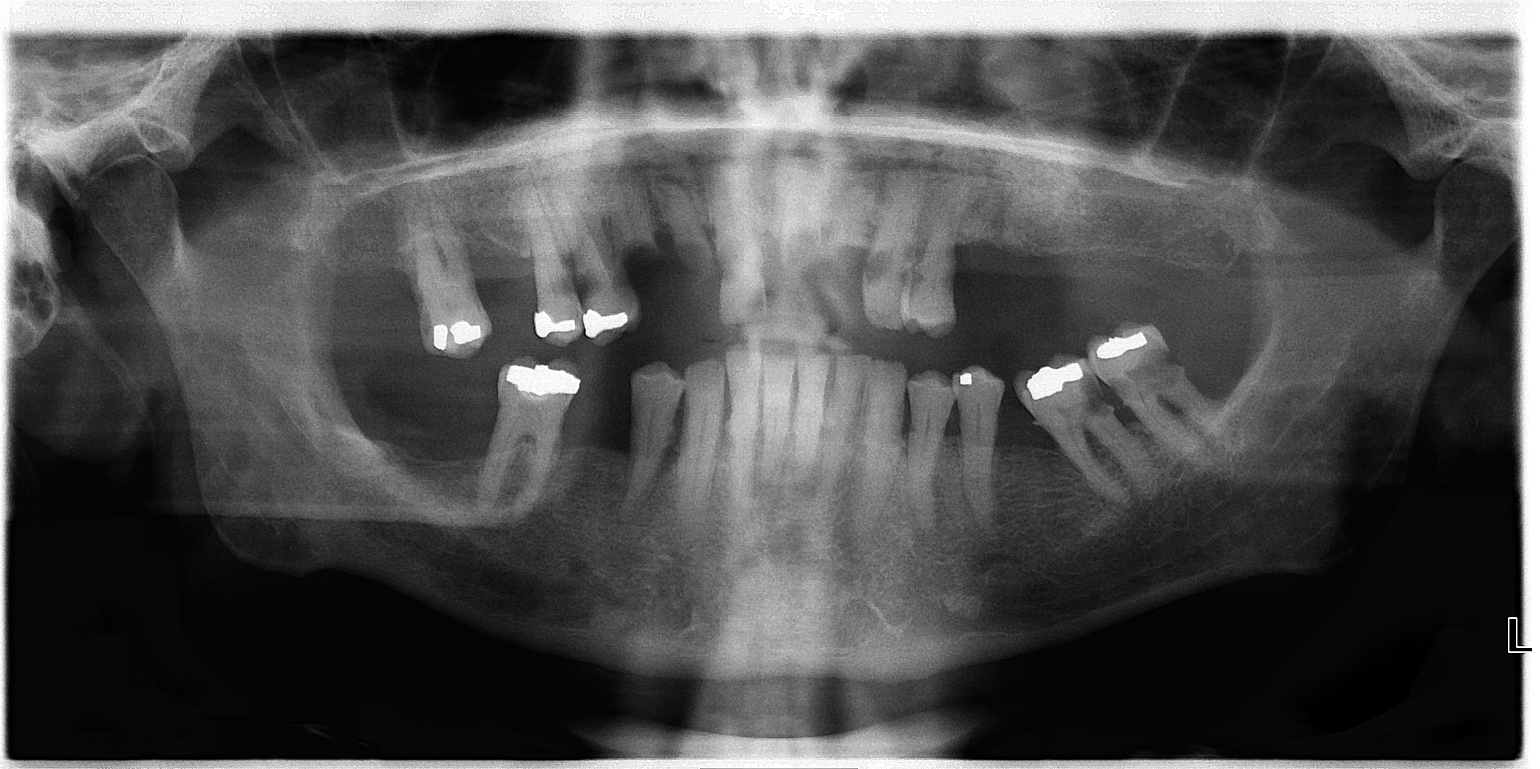

[1 of 1 positions shown; findings below may reference images not displayed]

FINDINGS: Prominent dental caries are present within teeth 6, 7, 9, 10.
Smaller caries are present in the remaining maxillary teeth. Modest
caries are present teeth 18 and 19.

The mandible is intact and located. No other osseous abnormalities
are present.
IMPRESSION: 1. Prominent dental caries within teeth 6, 7, 9, 10.
2. Modest caries involving teeth 18 and 19.

## 2021-08-21 MED ORDER — MAGNESIUM SULFATE 2 GM/50ML IV SOLN: 2.0000 g | Freq: Once | INTRAVENOUS | Status: DC

## 2021-08-21 MED ORDER — MAGNESIUM SULFATE IN D5W 1-5 GM/100ML-% IV SOLN
1.0000 g | Freq: Once | INTRAVENOUS | Status: AC
  Administered 2021-08-21: 1 g via INTRAVENOUS
  Filled 2021-08-21: qty 100

## 2021-08-21 MED ORDER — WARFARIN SODIUM 2.5 MG PO TABS
2.5000 mg | ORAL_TABLET | Freq: Once | ORAL | Status: AC
  Administered 2021-08-21: 2.5 mg via ORAL
  Filled 2021-08-21: qty 1

## 2021-08-21 MED ORDER — CEFDINIR 300 MG PO CAPS
300.0000 mg | ORAL_CAPSULE | Freq: Two times a day (BID) | ORAL | Status: AC
  Administered 2021-08-21 – 2021-08-23 (×6): 300 mg via ORAL
  Filled 2021-08-21 (×6): qty 1

## 2021-08-21 NOTE — TOC Initial Note (Signed)
Transition of Care (TOC) - Initial/Assessment Note  ? ? ?Patient Details  ?Name: Rodney [name] ?MRN: [id number] ?Date of Birth: Sep 11, 1950 ? ?Transition of Care (TOC) CM/SW Contact:    ?Zenon Mayo, RN ?Phone Number: ?08/21/2021, 5:14 PM ? ?Clinical Narrative:                 ?Patient is set up with [facility] for [facility] , [place], [place], will need [place] orders.  ? ?Expected Discharge Plan: [place] ?Barriers to Discharge: Continued Medical Work up ? ? ?Patient Goals and CMS Choice ?Patient states their goals for this hospitalization and ongoing recovery are:: return home ?CMS Medicare.gov Compare Post Acute Care list provided to:: Patient ?Choice offered to / list presented to : Patient ? ?Expected Discharge Plan and Services ?Expected Discharge Plan: [place] ?  ?Discharge Planning Services: CM Consult ?Post Acute Care Choice: Home Health ?Living arrangements for the past 2 months: [place], [place] ?                ?  ?DME Agency: NA ?  ?  ?  ?HH Arranged: RN, PT, OT ?[place] Agency: [place] ?Date HH Agency Contacted: 08/20/21 ?Time [place]: 2119 ?Representative spoke with at [place]: Tommi Rumps ? ?Prior Living Arrangements/Services ?Living arrangements for the past 2 months: [place] ?  ?Patient language and need for interpreter reviewed:: Yes ?Do you feel safe going back to the place where you live?: Yes      ?Need for Family Participation in Patient Care: Yes (Comment) ?Care giver support system in place?: Yes (comment) ?  ?Criminal Activity/Legal Involvement Pertinent to Current Situation/Hospitalization: No - Comment as needed ? ?Activities of Daily Living ?  ?  ? ?Permission Sought/Granted ?  ?  ?   ?   ?   ?   ? ?Emotional Assessment ?  ?  ?  ?Orientation: : Oriented to Self, Oriented to Place, Oriented to  Time, Oriented to Situation ?Alcohol / Substance Use: Not Applicable ?Psych Involvement: No (comment) ? ?Admission diagnosis:  COPD  exacerbation ([place]) [J44.1] ?AKI (acute kidney injury) ([place]) [N17.9] ?Chronic respiratory failure with hypoxia and hypercapnia (HCC) [J96.11, J96.12] ?Patient Active Problem List  ? Diagnosis Date Noted  ? History of multiple blood transfusions 08/20/2021  ? History of amputation of right great toe ([place]) 08/20/2021  ? Emphysema of lung ([place]) 08/20/2021  ? Atherosclerosis of aorta ([place]) 08/20/2021  ? Pleural effusion on right   ? Chronic blood loss anemia   ? COPD exacerbation ([place]) 08/19/2021  ? Acute on chronic respiratory failure with hypoxia and hypercapnia ([place]) 08/19/2021  ? Pneumonia 07/20/2021  ? Pressure injury of skin 06/20/2021  ? C. difficile diarrhea 06/20/2021  ? Hypoalbuminemia 06/20/2021  ? Diarrhea 06/19/2021  ? Acute on chronic diastolic heart failure ([place])   ? Chronic respiratory failure with hypoxia and hypercapnia ([place]) 03/26/2021  ? Acute kidney injury superimposed on chronic kidney disease ([place]) 03/26/2021  ? Chronic pleural effusion, Left 11/16/2020  ? Peripheral neuropathy 11/11/2020  ? History of DVT (deep vein thrombosis) 11/10/2020  ? Acute on chronic anemia   ? 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 ([place]) 08/04/2020  ? 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  ? CAD (coronary artery disease) 02/15/2020  ? CKD stage 3 due to type 2 diabetes mellitus ([place]) 02/15/2020  ? Hx of BKA, left ([place]) 11/14/2019  ? High risk  social situation 11/14/2019  ? Atrial flutter ([place])   ? AF (paroxysmal atrial fibrillation) ([place]) 10/03/2019  ? HFrEF (heart failure with reduced ejection fraction) ([place]) 09/28/2019  ? Severe aortic stenosis 09/28/2019  ? Anemia of chronic disease 09/27/2019  ? Protein calorie malnutrition ([place])   ? PVD (peripheral vascular disease) ([place])   ? Umbilical hernia 63/04/6008  ? Tobacco abuse 09/07/2013  ? DM (diabetes mellitus), type 2 with neurological complications ([place]) 93/23/5573  ? Hypercholesteremia 10/28/2010  ? ERECTILE DYSFUNCTION 05/22/2009  ?  Hypertension associated with chronic kidney disease due to type 2 diabetes mellitus ([place]) 01/23/2009  ? ?PCP:  [name], MD ?Pharmacy:   ?[place], [place] - [address] ?[address] ?[place] [address] ?Phone: [phone] Fax: [phone] ? ?[place], [place] ?[address] ?[place] [address] ?Phone: [phone] Fax: [phone] ? ? ? ? ?Social Determinants of Health (SDOH) Interventions ?  ? ?Readmission Risk Interventions ? ?  08/21/2021  ?  5:10 PM 06/26/2021  ?  4:36 PM 05/16/2021  ?  3:13 PM  ?Readmission Risk Prevention Plan  ?Transportation Screening Complete Complete Complete  ?Medication Review Press photographer) Complete Complete Complete  ?PCP or Specialist appointment within 3-5 days of discharge Complete Complete Complete  ?[place] or Home Care Consult Complete Complete Complete  ?SW Recovery Care/Counseling Consult Complete Complete Complete  ?Palliative Care Screening Not Applicable Not Applicable Not Applicable  ?[place] Not Applicable Complete Not Applicable  ? ? ? ?

## 2021-08-21 NOTE — Progress Notes (Signed)
Pt scheduled for orthopantogram but not able to stand. NP notified. Will continue to monitor. Will continue POC.  ?

## 2021-08-21 NOTE — Consult Note (Signed)
?Hospital Consult ? ? ? ?Reason for Consult:  gangrene right 2nd toe with need to TAVR ?Referring Physician:  Dr. Burt Knack ?MRN #:  [id number] ? ?History of Present Illness: This is a 71 y.o. 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  He has stable gangrenous changes states that his leg feels much better at this time.  He has not had any fevers or chills.  He is now admitted with acute on chronic hypoxic respiratory failure with bilateral pleural effusions found to have severe aortic stenosis and being considered for TAVR. ? ?Past Medical History:  ?Diagnosis Date  ? Acute combined systolic and diastolic congestive heart failure ([place])   ? Acute on chronic heart failure with preserved ejection fraction (HFpEF) ([place])   ? Acute respiratory failure with hypoxia ([place])   ? Acute respiratory failure with hypoxia ([place])   ? Aortic stenosis   ? moderate in 2022  ? Atrial fibrillation ([place])   ? CHF (congestive heart failure) ([place])   ? Chronic pleural effusion, Left 11/16/2020  ? COPD exacerbation ([place]) 12/09/2020  ? Coronary artery disease   ? Demand ischemia ([place]) 03/26/2021  ? Diabetes mellitus without complication ([place])   ? HLD (hyperlipidemia)   ? Hypertension   ? Long term (current) use of anticoagulants 12/29/2019  ? Malnutrition of moderate degree 05/09/2021  ? Peripheral arterial disease ([place])   ? Pneumonia due to COVID-19 virus 05/29/2021  ? ? ?Past Surgical History:  ?Procedure Laterality Date  ? ABDOMINAL AORTOGRAM W/LOWER EXTREMITY N/A 08/05/2020  ? Procedure: ABDOMINAL AORTOGRAM W/LOWER EXTREMITY;  Surgeon: Marty Heck, MD;  Location: [facility];  Service: Cardiovascular;  Laterality: N/A;  ? ABDOMINAL AORTOGRAM W/LOWER EXTREMITY N/A 11/13/2020  ? Procedure:  ABDOMINAL AORTOGRAM W/LOWER EXTREMITY;  Surgeon: Cherre Robins, MD;  Location: [facility];  Service: Cardiovascular;  Laterality: N/A;  ? ABDOMINAL AORTOGRAM W/LOWER EXTREMITY N/A 05/12/2021  ? Procedure: ABDOMINAL AORTOGRAM W/LOWER EXTREMITY;  Surgeon: Waynetta Sandy, MD;  Location: [facility];  Service: Cardiovascular;  Laterality: N/A;  ? AMPUTATION Left 09/28/2019  ? Procedure: AMPUTATION BELOW KNEE;  Surgeon: Newt Minion, MD;  Location: [facility];  Service: Orthopedics;  Laterality: Left;  ? AMPUTATION Right 11/15/2020  ? Procedure: RIGHT GREAT TOE AMPUTATION;  Surgeon: Newt Minion, MD;  Location: [facility];  Service: Orthopedics;  Laterality: Right;  ? CARDIAC CATHETERIZATION    ? CARDIOVERSION N/A 10/05/2019  ? Procedure: CARDIOVERSION;  Surgeon: Sanda Klein, MD;  Location: [facility];  Service: Cardiovascular;  Laterality: N/A;  ? FEMORAL-POPLITEAL BYPASS GRAFT Right 08/07/2020  ? Procedure: RIGHT FEMORAL TO BELOW KNEE POPLITEAL ARTERY BYPASS;  Surgeon: Waynetta Sandy, MD;  Location: [facility];  Service: Vascular;  Laterality: Right;  ? LEFT HEART CATH AND CORONARY ANGIOGRAPHY N/A 10/03/2019  ? Procedure: LEFT HEART CATH AND CORONARY ANGIOGRAPHY;  Surgeon: Lorretta Harp, MD;  Location: [facility];  Service: Cardiovascular;  Laterality: N/A;  ? PERIPHERAL VASCULAR INTERVENTION Right 08/05/2020  ? Procedure: PERIPHERAL VASCULAR INTERVENTION;  Surgeon: Marty Heck, MD;  Location: [facility];  Service: Cardiovascular;  Laterality: Right;  common Iliac  ? PERIPHERAL VASCULAR INTERVENTION Left 11/13/2020  ? Procedure: PERIPHERAL VASCULAR INTERVENTION;  Surgeon: Cherre Robins,  MD;  Location: [facility];  Service: Cardiovascular;  Laterality: Left;  ? PERIPHERAL VASCULAR INTERVENTION Right 11/14/2020  ? Procedure: PERIPHERAL VASCULAR INTERVENTION;  Surgeon: Marty Heck, MD;  Location: [facility];  Service: Cardiovascular;   Laterality: Right;  POP/SFA STENT  ? PERIPHERAL VASCULAR INTERVENTION  05/12/2021  ? Procedure: PERIPHERAL VASCULAR INTERVENTION;  Surgeon: Waynetta Sandy, MD;  Location: [facility];  Service: Cardiovascular;;  ? TEE WITHOUT CARDIOVERSION N/A 10/05/2019  ? Procedure: TRANSESOPHAGEAL ECHOCARDIOGRAM (TEE);  Surgeon: Sanda Klein, MD;  Location: [facility];  Service: Cardiovascular;  Laterality: N/A;  ? ? ?No Known Allergies ? ?Prior to Admission medications   ?Medication Sig Start Date End Date Taking? Authorizing Provider  ?acetaminophen (TYLENOL) 500 MG tablet Take 1,000 mg by mouth every 6 (six) hours as needed for mild pain.   Yes [provider]  ?albuterol (VENTOLIN HFA) 108 (90 Base) MCG/ACT inhaler INHALE 2 PUFFS INTO THE LUNGS EVERY 6 (SIX) HOURS AS NEEDED FOR WHEEZING OR SHORTNESS OF BREATH. 04/24/21  Yes Hensel, Jamal Collin, MD  ?atorvastatin (LIPITOR) 80 MG tablet TAKE 1 TABLET EVERY DAY ?Patient taking differently: Take 80 mg by mouth daily. 08/14/21  Yes O'Neal, Cassie Freer, MD  ?carvedilol (COREG) 12.5 MG tablet Take 6.25 mg by mouth 2 (two) times daily with a meal. 12/18/20  Yes [provider]  ?clopidogrel (PLAVIX) 75 MG tablet TAKE 1 TABLET EVERY DAY WITH BREAKFAST ?Patient taking differently: Take 75 mg by mouth daily. 03/17/21  Yes Hensel, Jamal Collin, MD  ?fluticasone (FLONASE) 50 MCG/ACT nasal spray Place 2 sprays into both nostrils daily. 02/13/21  Yes Simmons-Robinson, Makiera, MD  ?Fluticasone-Umeclidin-Vilant (TRELEGY ELLIPTA) 100-62.5-25 MCG/ACT AEPB Inhale 1 puff into the lungs daily. 06/01/21  Yes Eppie Gibson, MD  ?furosemide (LASIX) 40 MG tablet Take 1 tablet (40 mg total) by mouth daily. 07/26/21  Yes Maxwell, Allee, MD  ?insulin detemir (LEVEMIR) 100 UNIT/ML FlexPen Inject 15 Units into the skin daily. 07/26/21  Yes Maxwell, Allee, MD  ?ipratropium-albuterol (DUONEB) 0.5-2.5 (3) MG/3ML SOLN Take 3 mLs by nebulization every 4 (four) hours as needed.  05/19/21  Yes Hensel, Jamal Collin, MD  ?metFORMIN (GLUCOPHAGE) 1000 MG tablet Take 1 tablet (1,000 mg total) by mouth daily. 04/07/21  Yes Lavina Hamman, MD  ?pantoprazole (PROTONIX) 40 MG tablet Take 1 tablet (40 mg total) by mouth 2 (two) times daily. 08/12/21 09/11/21 Yes McDiarmid, Blane Ohara, MD  ?traZODone (DESYREL) 50 MG tablet Take 0.5-1 tablets (25-50 mg total) by mouth at bedtime as needed for sleep. 05/19/21  Yes Hensel, Jamal Collin, MD  ?warfarin (COUMADIN) 2.5 MG tablet Take 1 tablet (2.5 mg total) by mouth daily. 07/29/21 07/29/22 Yes Eppie Gibson, MD  ?Blood Glucose Monitoring Suppl (TRUE METRIX METER) DEVI Use to test blood sugar three times daily. 11/20/19   [name], MD  ?Blood Glucose Monitoring Suppl (TRUE METRIX METER) w/Device KIT USE AS DIRECTED 03/25/20   [name], MD  ?ferrous sulfate 325 (65 FE) MG tablet Take 1 tablet (325 mg total) by mouth every other day. ?Patient not taking: Reported on 07/20/2021 05/18/21   Shary Key, DO  ?glucose blood (RELION TRUE METRIX TEST STRIPS) test strip Use to test blood sugar three times per day. 11/20/19   [name], MD  ?Insulin Pen Needle 32G X 4 MM MISC Use to inject insulin up to 4 times daily as directed, 05/16/21     ?lactose free nutrition (  BOOST) LIQD Take 237 mLs by mouth 3 (three) times a week. ?Patient not taking: Reported on 08/19/2021    [provider]  ?nicotine (NICODERM CQ - DOSED IN MG/24 HR) 7 mg/24hr patch Place 1 patch (7 mg total) onto the skin daily. ?Patient not taking: Reported on 07/20/2021 07/01/21   Shary Key, DO  ?TRUEplus Lancets 33G MISC Use to test blood sugar three times per day. ?Patient not taking: Reported on 05/07/2021 11/20/19   [name], MD  ? ? ?Social History  ? ?Socioeconomic History  ? Marital status: Widowed  ?  Spouse name: Not on file  ? Number of children: Not on file  ? Years of education: Not on file  ? Highest education level: Not on file  ?Occupational History  ? Not  on file  ?Tobacco Use  ? Smoking status: Former  ?  Packs/day: 0.50  ?  Years: 50.00  ?  Pack years: 25.00  ?  Types: Cigarettes  ?  Start date: 04/09/2021  ? Smokeless tobacco: Never  ?Vaping Use  ? Vaping U

## 2021-08-21 NOTE — Chronic Care Management (AMB) (Signed)
?Chronic Care Management  ? ?CCM RN Visit Note ? ?08/21/2021 ?Name: Rodney [name] MRN: [id number] DOB: 01-02-1951 ? ?Subjective: ?Rodney [name] is a 71 y.o. year old male who is a primary care patient of Hensel, Jamal Collin, MD. The care management team was consulted for assistance with disease management and care coordination needs.   ? ?Engaged with patient by telephone for follow up visit in response to provider referral for case management and/or care coordination services.  ? ?Consent to Services:  ?The patient was given information about Chronic Care Management services, agreed to services, and gave verbal consent prior to initiation of services.  Please see initial visit note for detailed documentation.  ? ?Patient agreed to services and verbal consent obtained.  ? ? ?Summary:  The patient  continues to experience difficulty with shortness of breath and had a COPD exacerbation and has been been admitted to the hospital. .. See Care Plan below for interventions and patient self-care actives. ? ?Recommendation: The patient may benefit from following hospital adivice. ? ?Follow up Plan:  RNCM will monitor patient progress. ? ? ? ?Assessment: Review of patient past medical history, allergies, medications, health status, including review of consultants reports, laboratory and other test data, was performed as part of comprehensive evaluation and provision of chronic care management services.  ? ?SDOH (Social Determinants of Health) assessments and interventions performed:  No ? ?CCM Care Plan ? ? ? ?Conditions to be addressed/monitored:COPD ? ?Care Plan : RN Case Manager  ?Updates made by Lazaro Arms, RN since 08/21/2021 12:00 AM  ?  ? ?Problem: General Plan of Care in a patient with COPD   ?  ? ?Long-Range Goal: The patient and cartaker will learn skills to prevent exacerbation   ?Start Date: 08/07/2021  ?Expected End Date: 10/24/2021  ?Priority: High  ?Note:   ?Current Barriers:  ?Chronic Disease Management  support and education needs related to COPD ?Transportation barriers ? ?RNCM Clinical Goal(s):  ?Patient will verbalize understanding of plan for management of COPD as evidenced by patient having no admissions in 30 days  through collaboration with RN Care manager, provider, and care team.  ? ?Interventions: ?1:1 collaboration with primary care provider regarding development and update of comprehensive plan of care as evidenced by provider attestation and co-signature ?Inter-disciplinary care team collaboration (see longitudinal plan of care) ?Evaluation of current treatment plan related to  self management and patient's adherence to plan as established by provider ? ? ?COPD: (Status: New goal.) Long Term Goal  ?Reviewed medications with patient, including use of prescribed maintenance and rescue inhalers, and provided instruction on medication management and the importance of adherence ?Will provided patient with basic written COPD education on self care/management/and exacerbation prevention ?Will provided written instructions on pursed lip breathing and utilized returned demonstration as teach back ?Advised patient to self assesses COPD action plan zone and make appointment with provider if in the yellow zone for 48 hours without improvement ?Discussed the importance of adequate rest and management of fatigue with COPD ?08/21/21:  I chatted with [name], and she is sick with pneumonia. The patient is in the hospital. She states that his o2 sats dropped, and he was admitted for COPD exacerbation. I explained that I would monitor his progress and call to check on him when he is released. ?   ?Patient Goals/Self Care Activities: ?-Patient/Caregiver will self-administer medications as prescribed as evidenced by self-report/primary caregiver report  ?-Patient/Caregiver will attend all scheduled provider appointments as evidenced by clinician review  of documented attendance to scheduled appointments and patient/caregiver  report ?-Patient/Caregiver will call pharmacy for medication refills as evidenced by patient report and review of pharmacy fill history as appropriate ?-Patient/Caregiver will call provider office for new concerns or questions as evidenced by review of documented incoming telephone call notes and patient report ?-Patient/Caregiver verbalizes understanding of plan ?-Patient/Caregiver will focus on medication adherence by taking medications as prescribed ?-Avoid smoke and air pollution ?-Keep your airway clear from mucus build up  ?-Visit your doctor on a regular basis ?-Self assess COPD action plan zone and make appointment with provider if you have been in the yellow zone for 48 hours without improvement. ? ?  ? ?Lazaro Arms RN, BSN, [place] ?Care Management Coordinator ?[facility]  ?Phone: [phone]  ?  ? ? ? ? ? ? ? ? ?

## 2021-08-21 NOTE — Progress Notes (Addendum)
Family Medicine Teaching Service ?Daily Progress Note ?Intern Pager: [phone] ? ?Patient name: Rodney [name] Medical record number: [id number] ?Date of birth: 12-08-1950 Age: 71 y.o. Gender: male ? ?Primary Care Provider: [name], MD ?Consultants: Cardiology ?Code Status: Full ? ?Pt Overview and Major Events to Date:  ?4/26- admitted ? ?Assessment and Plan: ?Rodney [name] is a 71 y.o. male presenting with acute on chronic hypoxic respiratory failure, presumed 2/2 COPD exacerbation. PMH is significant for DM 2, obesity, tobacco abuse, hypertension, protein calorie malnutrition, aortic stenosis, anemia chronic disease, PVD, HFrEF, PAF, status post BKA, CAD, CKD stage III, COPD ? ?Acute on Chronic Hypoxic Respiratory Failure Likely 2/2 COPD Exacerbation ?[name] remained stable on 4L  [place] overnight, still above his home requirement of 3 L.  Reassuringly, he remains afebrile and without a leukocytosis.  He got a DuoNeb treatment prior to bed and then his scheduled 2 AM dose.  He feels ready for another DuoNeb this morning. ?- Continue prednisone '40mg'$  daily (day 3/5 steroids) ?- Can de-escalate cefepime to cefdinir (day 3/5 abx) ?- Continue Breo + Incruse daily ?- Duonebs q6h scheduled with q2h PRN ?- Outpatient PET follow-up for increasing mediastinal lymph nodes ?- SpO2 goal 88-92% ? ?Bilateral Pleural Effusions ?CT with new moderate pleural effusion on R and marginally increased size left effusion that has been present since at least July of 2022.  Afebrile, no evidence of systemic infection. ?-If we cannot wean him with treatment of his COPD exacerbation, can consider thoracentesis, at least of the right side.  Though uncertain whether he would be a candidate for thoracentesis with a therapeutic INR ? ?Heart Failure with Recovered EF  Severe Aortic Stenosis ?Evaluated by cardiology yesterday given severe, symptomatic aortic stenosis. Cardiology to discuss his case with vascular surgery and with  interdisciplinary valve team to determine his eligibility for possible TAVR. Complicated due to his multiple comorbidities and advanced vascular disease.  ?Restarted his home Lasix yesterday. Cr stable 1.74>1.79. ?- Cardiology following, appreciate input ?- Continue home Lasix ? ?Anemia of Chronic Kidney Disease ?Hgb stable 8.5>8.2 today. Received 1 unit PRBC yesterday. No evidence of active blood loss. ?- Hold home iron to avoid masking a bleed ?- IV iron prior to discharge ? ?AKI on CKD IIIb ?Cr stable 1.74>1.79. Still above baseline of ~1.4.  ?- Continue Lasix ?- Continue to trend ?- POAL ? ?Hypomagnesemia ?Mag 1.6 this am. Received 1g repletion. ?- Recheck in am ? ?T2DM ?Glucose 168-209 over past 24 hours. Got 15 units LAI and 8 units SAI.  ?- Continue CBGs, mSSI ?- Continue Levemir 15 units daily ? ?Paroxysmal A Fib ?Remains in atrial fibrillation this morning. Rate controlled ?- Cards following as above ?- Continue home Coreg and Coumadin ? 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 ?- Continue home Plavix and Atorvastatin ?- Monitor toe daily ? ?Chronic, stable conditions ?GERD- Continue home Protonix '40mg'$  BID ?Insomnia - Continue home trazodone PRN QHS ? ?FEN/GI: Carb-modified ?PPx: On Coumadin ?Dispo:Home with home health   pending cardiology determination of surgical candidacy .   ? ?Subjective:  ?[name] continues to feel "about the same" today as he did yesterday.  He feels that he would benefit from a DuoNeb treatment at this time.  He denies any new or acute complaints.  He is eager to hear what the cardiology team has to say regarding his candidacy for  TAVR. ? ?Objective: ?Temp:  [97.4 ?F (36.3 ?C)-98.1 ?F (36.7 ?C)] 97.9 ?F (36.6 ?C) (04/27 0353) ?Pulse Rate:  [43-100] 81 (04/27 0353) ?Resp:  [15-32] 20 (04/27 0353) ?BP: (119-141)/(71-106) 119/75 (04/27 0353) ?SpO2:  [89 %-100 %] 100 % (04/27  0353) ?Weight:  [91 kg] 91 kg (04/27 0024) ?Physical Exam: ?General: Awake and alert, sitting up at side of bed ?Cardiovascular: Irregularly irregular, 3/6 systolic murmur ?Respiratory: Normal WOB on 4L, diffusely wheezy, without foci of crackles ?Abdomen: Obese, non-tender, soft ?Extremities: S/p L BKA, s/p R great toe amputation, R second toe with gangrenous changes but no evidence of infectious transformation ? ?Laboratory: ?Recent Labs  ?Lab 08/19/21 ?3664 08/19/21 ?4034 08/20/21 ?0235 08/20/21 ?7425 08/20/21 ?1422 08/21/21 ?0344  ?WBC 9.2  --  5.6  --   --  7.7  ?HGB 9.2*   < > 7.6* 7.5* 8.5* 8.2*  ?HCT 30.3*   < > 24.9* 24.6* 27.6* 26.2*  ?PLT 329  --  245  --   --  247  ? < > = values in this interval not displayed.  ? ?Recent Labs  ?Lab 08/19/21 ?9563 08/19/21 ?8756 08/19/21 ?1319 08/20/21 ?0235 08/21/21 ?0344  ?NA 140   < > 138 137 136  ?K 4.4   < > 4.4 4.6 4.5  ?CL 98  --   --  98 97*  ?CO2 35*  --   --  33* 33*  ?BUN 31*  --   --  34* 37*  ?CREATININE 2.15*  --   --  1.74* 1.79*  ?CALCIUM 8.5*  --   --  8.4* 8.5*  ?GLUCOSE 219*  --   --  204* 168*  ? < > = values in this interval not displayed.  ? ? ?Imaging/Diagnostic Tests: ?No new imaging, tests ? ?Eppie Gibson, MD ?08/21/2021, 7:07 AM ?PGY-1, [facility] ?[facility] Intern pager: [phone], text pages welcome ? ?

## 2021-08-21 NOTE — Plan of Care (Signed)
  Problem: Education: Goal: Knowledge of disease or condition will improve Outcome: Progressing   Problem: Activity: Goal: Ability to tolerate increased activity will improve Outcome: Progressing   Problem: Respiratory: Goal: Ability to maintain a clear airway will improve Outcome: Progressing   

## 2021-08-21 NOTE — Progress Notes (Signed)
ANTICOAGULATION CONSULT NOTE - Follow-Up Consult ? ?Pharmacy Consult for warfarin ?Indication: atrial fibrillation ? ?No Known Allergies ? ?Patient Measurements: ?Height: 6' (182.9 cm) ?Weight: 91 kg (200 lb 11.2 oz) ?IBW/kg (Calculated) : 77.6 ? ? ?Vital Signs: ?Temp: 97.9 ?F (36.6 ?C) (04/27 0747) ?Temp Source: Oral (04/27 0747) ?BP: 126/70 (04/27 0747) ?Pulse Rate: 83 (04/27 0747) ? ?Labs: ?Recent Labs  ?  08/19/21 ?7026 08/19/21 ?3785 08/19/21 ?8850 08/19/21 ?2774 08/19/21 ?1319 08/20/21 ?0235 08/20/21 ?1287 08/20/21 ?1422 08/21/21 ?0344  ?HGB 9.2*   < >  --   --    < > 7.6* 7.5* 8.5* 8.2*  ?HCT 30.3*   < >  --   --    < > 24.9* 24.6* 27.6* 26.2*  ?PLT 329  --   --   --   --  245  --   --  247  ?LABPROT  --   --   --  19.5*  --  18.5*  --   --  20.8*  ?INR  --   --   --  1.7*  --  1.6*  --   --  1.8*  ?CREATININE 2.15*  --   --   --   --  1.74*  --   --  1.79*  ?TROPONINIHS 36*  --  40*  --   --   --   --   --   --   ? < > = values in this interval not displayed.  ? ? ? ?Estimated Creatinine Clearance: 42.7 mL/min (A) (by C-G formula based on SCr of 1.79 mg/dL (H)). ? ? ?Medical History: ?Past Medical History:  ?Diagnosis Date  ? Acute combined systolic and diastolic congestive heart failure ([place])   ? Acute on chronic heart failure with preserved ejection fraction (HFpEF) ([place])   ? Acute respiratory failure with hypoxia ([place])   ? Acute respiratory failure with hypoxia ([place])   ? Aortic stenosis   ? moderate in 2022  ? Atrial fibrillation ([place])   ? CHF (congestive heart failure) ([place])   ? Chronic pleural effusion, Left 11/16/2020  ? COPD exacerbation ([place]) 12/09/2020  ? Coronary artery disease   ? Demand ischemia ([place]) 03/26/2021  ? Diabetes mellitus without complication ([place])   ? HLD (hyperlipidemia)   ? Hypertension   ? Long term (current) use of anticoagulants 12/29/2019  ? Malnutrition of moderate degree 05/09/2021  ? Peripheral arterial disease ([place])   ? Pneumonia due to COVID-19 virus 05/29/2021   ? ? ?Assessment: ?71 yo M presenting with dyspnea 2/2 COPD exacerbation. PMH significant for afib on PTA warfarin 2.'5mg'$  daily. Pharmacy consulted for warfarin dosing.  ? ?4/27 INR 1.8, subtherapeutic ?Hgb 9.5 > 7.5 > 1 unit RBC > 8s and plt remain normal. No overt s/s of bleeding noted.  ? ?Goal of Therapy:  ?Goal INR: 2-2.5 (decreased due to normocytic anemia in past) ?Monitor platelets by anticoagulation protocol: Yes ?  ?Plan:  ?Warfarin 2.5 mg PO tonight ?Daily CBC and INR ?Monitor for s/s of bleeding ? ?Thank you for involving pharmacy in this patient's care. ? ?Renold Genta, PharmD, BCPS ?Clinical Pharmacist ?Clinical phone for 08/21/2021 until 3p is [phone] ?08/21/2021 9:31 AM ? ?**Pharmacist phone directory can be found on [email or web address] listed under [place]** ? ?

## 2021-08-21 NOTE — Progress Notes (Addendum)
? ? ?[facility]   ?MULTIDISCIPLINARY HEART VALVE TEAM ? ?Patient Name: Rodney [name] ?Date of Encounter: 08/21/2021 ? ?Primary Cardiologist: Evalina Field, MD    ? ?Hospital Problem List  ?   ?Principal Problem: ?  Acute on chronic respiratory failure with hypoxia and hypercapnia (HCC) ?Active Problems: ?  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  DM (diabetes mellitus), type 2 with neurological complications ([place]) ?  HFrEF (heart failure with reduced ejection fraction) ([place]) ?  Severe aortic stenosis ?  AF (paroxysmal atrial fibrillation) ([place]) ?  Hx of BKA, left ([place]) ?  CAD (coronary artery disease) ?  Chronic respiratory failure with hypoxia and hypercapnia (HCC) ?  Acute kidney injury superimposed on chronic kidney disease ([place]) ?  Chronic pleural effusion, Left ?  COPD exacerbation ([place]) ?  History of multiple blood transfusions ?  History of amputation of right great toe ([place]) ?  Emphysema of lung ([place]) ?  Atherosclerosis of aorta ([place]) ?  Pleural effusion on right ?  Chronic blood loss anemia ?  ?Subjective  ? ?Laying in bed with no real complaints today. Denies chest pain, SOB.  ? ?Inpatient Medications  ?  ?Scheduled Meds: ? atorvastatin  80 mg Oral Daily  ? carvedilol  6.25 mg Oral BID WC  ? cefdinir  300 mg Oral Q12H  ? clopidogrel  75 mg Oral Daily  ? fluticasone furoate-vilanterol  1 puff Inhalation Daily  ? furosemide  40 mg Oral Daily  ? insulin aspart  0-20 Units Subcutaneous TID WC  ? insulin detemir  15 Units Subcutaneous QHS  ? ipratropium-albuterol  3 mL Nebulization Q6H  ? pantoprazole  40 mg Oral BID  ? predniSONE  40 mg Oral QAC breakfast  ? umeclidinium bromide  1 puff Inhalation Daily  ? Warfarin - Pharmacist Dosing Inpatient   Does not apply [id number]  ? ?Continuous Infusions: ? ?PRN Meds: ?ipratropium-albuterol, traZODone  ? ?Vital Signs  ?  ?Vitals:  ? 08/21/21 0353 08/21/21 0747 08/21/21 0756 08/21/21 0802  ?BP: 119/75  126/70    ?Pulse: 81 83    ?Resp: 20 19    ?Temp: 97.9 ?F (36.6 ?C) 97.9 ?F (36.6 ?C)    ?TempSrc: Oral Oral    ?SpO2: 100% 99% 100% 100%  ?Weight:      ?Height:      ? ? ?Intake/Output Summary (Last 24 hours) at 08/21/2021 0836 ?Last data filed at 08/21/2021 8366 ?Gross per 24 hour  ?Intake 1123.33 ml  ?Output 825 ml  ?Net 298.33 ml  ? ?Filed Weights  ? 08/19/21 1500 08/20/21 0400 08/21/21 0024  ?Weight: 96.1 kg 98.3 kg 91 kg  ? ?Physical Exam  ? ?General: Ill appearing, NAD ?Lungs: Diminished bilaterally with diffuse crackles. Breathing is unlabored. ?Cardiovascular: Irregularly irregular. Harsh MSB systolic murmur ?Abdomen: Soft, non-tender, non-distended.No obvious abdominal masses. ?Extremities: No RLE edema  ?Neuro: Alert and oriented. No focal deficits. No facial asymmetry. MAE spontaneously. ?Psych: Responds to questions appropriately with normal affect.  ? ?Labs  ?  ?CBC ?Recent Labs  ?  08/19/21 ?2947 08/19/21 ?6546 08/20/21 ?0235 08/20/21 ?5035 08/20/21 ?1422 08/21/21 ?0344  ?WBC 9.2  --  5.6  --   --  7.7  ?NEUTROABS 7.3  --   --   --   --   --   ?HGB 9.2*   < > 7.6*   < > 8.5* 8.2*  ?HCT 30.3*   < >  24.9*   < > 27.6* 26.2*  ?MCV 97.4  --  95.8  --   --  93.2  ?PLT 329  --  245  --   --  247  ? < > = values in this interval not displayed.  ? ?Basic Metabolic Panel ?Recent Labs  ?  08/20/21 ?0235 08/21/21 ?0344  ?NA 137 136  ?K 4.6 4.5  ?CL 98 97*  ?CO2 33* 33*  ?GLUCOSE 204* 168*  ?BUN 34* 37*  ?CREATININE 1.74* 1.79*  ?CALCIUM 8.4* 8.5*  ?MG  --  1.6*  ? ?Liver Function Tests ?No results for input(s): AST, ALT, ALKPHOS, BILITOT, PROT, ALBUMIN in the last 72 hours. ?No results for input(s): LIPASE, AMYLASE in the last 72 hours. ?Cardiac Enzymes ?No results for input(s): CKTOTAL, CKMB, CKMBINDEX, TROPONINI in the last 72 hours. ?BNP ?Invalid input(s): POCBNP ?D-Dimer ?No results for input(s): DDIMER in the last 72 hours. ?Hemoglobin A1C ?Recent Labs  ?  08/20/21 ?0235  ?HGBA1C 5.9*  ? ?Fasting Lipid  Panel ?No results for input(s): CHOL, HDL, LDLCALC, TRIG, CHOLHDL, LDLDIRECT in the last 72 hours. ?Thyroid Function Tests ?No results for input(s): TSH, T4TOTAL, T3FREE, THYROIDAB in the last 72 hours. ? ?Invalid input(s): FREET3 ? ?Telemetry  ?  ?08/21/21 AF with HR in the 80's - Personally Reviewed ? ?ECG  ?  ?No new tracing as of 08/21/21 - Personally Reviewed ? ?Radiology  ?  ?CT CHEST WO CONTRAST ? ?Result Date: 08/19/2021 ?CLINICAL DATA:  Chronic dyspnea in a 71 year old male. EXAM: CT CHEST WITHOUT CONTRAST TECHNIQUE: Multidetector CT imaging of the chest was performed following the standard protocol without IV contrast. RADIATION DOSE REDUCTION: This exam was performed according to the departmental dose-optimization program which includes automated exposure control, adjustment of the mA and/or kV according to patient size and/or use of iterative reconstruction technique. COMPARISON:  September 28, 2019. Recent chest x-rays from February March of 2023. FINDINGS: Cardiovascular: Calcified atheromatous plaque of the thoracic aorta. No aneurysmal dilation. Mildly enlarged heart. No substantial pericardial effusion. Mitral annular calcifications. Three-vessel coronary artery disease. Central pulmonary vasculature at 3.5 cm. Mediastinum/Nodes: Enlarged lymph nodes along the RIGHT paratracheal chain, for example (image 48/3) 16 mm short axis. No thoracic inlet adenopathy. No axillary lymphadenopathy. Other lymph nodes aside from subcarinal lymph nodes are at or less than a cm. Subcarinal nodal tissue measuring up to 18 mm short axis. No gross hilar adenopathy. Lungs/Pleura: Loculated pleural fluid in the LEFT chest. Calcifications and or added density along the pleural surface in the LEFT chest is grossly similar to the prior study the volume of pleural fluid and or pleural thickening throughout the chest has increased in size. Fissural fluid in the LEFT mid chest in the major fissure measuring 4.8 x 3.6 cm. Loculated  fluid along the posterior LEFT chest in the LEFT upper chest along the upper lobe measuring 3.9 x 2.3 cm. Moderately large RIGHT pleural effusion with sub pulmonic component in dependent component, associated airspace disease in both the LEFT and RIGHT chest with background of paraseptal emphysema towards the lung apices. Increasing airspace process in the lingula with signs of distortion of adjacent pulmonary vessels (image 77/4) airways are patent. Upper Abdomen: Sludge layering in the gallbladder. Smooth hepatic contours. Imaged portions of pancreas, adrenal glands and kidneys are unremarkable. No acute upper abdominal findings. Musculoskeletal: No acute bone finding. No destructive bone process. Spinal degenerative changes. IMPRESSION: 1. Increasing bilateral pleural effusions with loculated appearance in the LEFT chest. Difficult to  exclude the possibility of underlying pneumonia given concomitant airspace process in the RIGHT lung base though this appears more compatible with volume loss. 2. Suspect developing rounded atelectasis in the lingula and LEFT upper lobe. Chronic pleural calcification in the LEFT chest is unilateral in may be related to prior infection or trauma. This may partially explain chronic parenchymal changes and developing suspected rounded atelectasis. 3. Increasing size of mediastinal lymph nodes, while potentially reactive show increase over time since previous imaging in the RIGHT paratracheal chain. Consider short interval follow-up or PET imaging for further evaluation outside of the acute setting, in 4-6 weeks. Aortic Atherosclerosis (ICD10-I70.0) and Emphysema (ICD10-J43.9). Electronically Signed   By: Zetta Bills M.D.   On: 08/19/2021 15:14  ? ?DG Chest Left Decubitus ? ?Result Date: 08/19/2021 ?CLINICAL DATA:  Pleural effusion EXAM: CHEST - LEFT DECUBITUS COMPARISON:  Chest x-ray dated August 19, 2021 FINDINGS: Left lateral decubitus views demonstrate a small loculatedpleural  effusion. Increased hazy opacification of the left hemithorax is likely due to atelectasis. Cardiac and mediastinal contours are not well visualized. IMPRESSION: Small loculated left pleural effusion. Electronically

## 2021-08-21 NOTE — Progress Notes (Signed)
Physical Therapy Treatment ?Patient Details ?Name: Rodney [name] ?MRN: [id number] ?DOB: [birth year] ?Today's Date: 08/21/2021 ? ? ?History of Present Illness Patient is a 71 yo male presenting to the ED with SOB on 08/19/21. Admitted same day with acute respiratory failure and COPD exacerbation. Patient with 4 admissions within the last 6 months.  PMH is significant for DM 2, obesity, tobacco abuse, hypertension, protein calorie malnutrition, aortic stenosis, anemia chronic disease, PVD, HFrEF, PAF, status post BKA, CAD, CKD stage III, COPD, COVID+ ? ?  ?PT Comments  ? ? The pt was agreeable to limited session, expressing concern for OOB transfers and mobility due to feeling weak. The pt therefore agreed to LE strengthening and completed multiple LE strengthening and ROM activities against min-mod resistance while sitting EOB. He did require seated rest after each set, but could continue with encouragement. The pt states he has all DME and assist needed at home, agreeable to attempt transfer to drop arm recliner in future sessions.  ?   ?Recommendations for follow up therapy are one component of a multi-disciplinary discharge planning process, led by the attending physician.  Recommendations may be updated based on patient status, additional functional criteria and insurance authorization. ? ?Follow Up Recommendations ? Home health PT ?  ?  ?Assistance Recommended at Discharge Frequent or constant Supervision/Assistance  ?Patient can return home with the following A lot of help with walking and/or transfers;A little help with bathing/dressing/bathroom;Assistance with cooking/housework;Assistance with feeding;Direct supervision/assist for medications management;Direct supervision/assist for financial management;Assist for transportation;Help with stairs or ramp for entrance ?  ?Equipment Recommendations ? None recommended by PT  ?  ?Recommendations for Other Services   ? ? ?  ?Precautions / Restrictions  Precautions ?Precautions: Fall ?Restrictions ?Weight Bearing Restrictions: No  ?  ? ?Mobility ? Bed Mobility ?Overal bed mobility: Independent ?Bed Mobility: Supine to Sit, Sit to Supine ?  ?  ?Supine to sit: Min assist ?Sit to supine: Modified independent (Device/Increase time) ?  ?General bed mobility comments: pt reaching for therapist assist to pull him to sitting, able to return to supine and roll without any assist ?  ? ?Transfers ?Overall transfer level: Needs assistance ?Equipment used: None ?Transfers: Bed to chair/wheelchair/BSC ?  ?  ?  ?  ?  ? Lateral/Scoot Transfers: Min guard ?General transfer comment: pt declining to leave EOB, but able to demo scooting along EOB, agreeable to transfer to drop arm recliner in future but is worried he wont be able to get back to bed ?  ? ?  ? ?  ?Balance Overall balance assessment: Needs assistance ?Sitting-balance support: Bilateral upper extremity supported, Feet supported ?Sitting balance-Leahy Scale: Fair ?  ?  ?  ?  ?Standing balance comment: pt refused ?  ?  ?  ?  ?  ?  ?  ?  ?  ?  ?  ?  ? ?  ?Cognition Arousal/Alertness: Awake/alert ?Behavior During Therapy: Flat affect, WFL for tasks assessed/performed ?Overall Cognitive Status: Within Functional Limits for tasks assessed ?  ?  ?  ?  ?  ?  ?  ?  ?  ?  ?  ?  ?  ?  ?  ?  ?General Comments: pt agreeable and answering questions, flat affect but seems to be close to his baseline personality. able to explain good safety awareness and set up for mobility at home ?  ?  ? ?  ?Exercises General Exercises - Lower Extremity ?Long Arc Quad: AROM, Both, 10 reps,  Seated (2 x 10 against min-mod resistance. 1-2 second pause at top) ?Heel Slides: AROM, Right, 10 reps, Seated (2 x 10 against min-mod resistance) ?Hip Flexion/Marching: AROM, Both, 10 reps, Seated (2 x 10) ? ?  ?General Comments   ?  ?  ? ?Pertinent Vitals/Pain Pain Assessment ?Pain Assessment: No/denies pain ?Pain Intervention(s): Monitored during session   ? ? ? ?PT Goals (current goals can now be found in the care plan section) Acute Rehab PT Goals ?Patient Stated Goal: get back home ?PT Goal Formulation: With patient ?Time For Goal Achievement: 09/03/21 ?Potential to Achieve Goals: Fair ?Progress towards PT goals: Progressing toward goals ? ?  ?Frequency ? ? ? Min 3X/week ? ? ? ?  ?PT Plan Current plan remains appropriate  ? ? ?   ?AM-PAC PT "6 Clicks" Mobility   ?Outcome Measure ? Help needed turning from your back to your side while in a flat bed without using bedrails?: None ?Help needed moving from lying on your back to sitting on the side of a flat bed without using bedrails?: None ?Help needed moving to and from a bed to a chair (including a wheelchair)?: A Little ?Help needed standing up from a chair using your arms (e.g., wheelchair or bedside chair)?: Total ?Help needed to walk in hospital room?: Total ?Help needed climbing 3-5 steps with a railing? : Total ?6 Click Score: 14 ? ?  ?End of Session Equipment Utilized During Treatment: Oxygen ?Activity Tolerance: Patient tolerated treatment well ?Patient left: in bed;with call bell/phone within reach;with bed alarm set ?Nurse Communication: Mobility status ?PT Visit Diagnosis: Muscle weakness (generalized) (M62.81);Other abnormalities of gait and mobility (R26.89) ?  ? ? ?Time: 5027-7412 ?PT Time Calculation (min) (ACUTE ONLY): 16 min ? ?Charges:  $Therapeutic Exercise: 8-22 mins          ?          ? ?West Carbo, PT, DPT  ? ?Acute Rehabilitation Department ?Pager #: [phone] ? ? ?Sandra Cockayne ?08/21/2021, 5:57 PM ? ?

## 2021-08-21 NOTE — Plan of Care (Signed)
?  Problem: Education: ?Goal: Knowledge of disease or condition will improve ?08/21/2021 2008 by Debbra Riding, RN ?Outcome: Progressing ?08/21/2021 2008 by Debbra Riding, RN ?Outcome: Progressing ?08/21/2021 0744 by Debbra Riding, RN ?Outcome: Progressing ?  ?Problem: Activity: ?Goal: Ability to tolerate increased activity will improve ?08/21/2021 2008 by Debbra Riding, RN ?Outcome: Progressing ?08/21/2021 2008 by Debbra Riding, RN ?Outcome: Progressing ?08/21/2021 0744 by Debbra Riding, RN ?Outcome: Progressing ?  ?Problem: Respiratory: ?Goal: Ability to maintain a clear airway will improve ?08/21/2021 2008 by Debbra Riding, RN ?Outcome: Progressing ?08/21/2021 0744 by Debbra Riding, RN ?Outcome: Progressing ?  ?

## 2021-08-21 NOTE — Progress Notes (Signed)
VASCULAR LAB ? ? ? ?Right lower extremity arterial duplex has been performed. ? ?See CV proc for preliminary results. ? ? ?Ahlivia Salahuddin, RVT ?08/21/2021, 12:01 PM ? ?

## 2021-08-22 ENCOUNTER — Inpatient Hospital Stay (HOSPITAL_COMMUNITY): Payer: Medicare PPO

## 2021-08-22 DIAGNOSIS — N179 Acute kidney failure, unspecified: Secondary | ICD-10-CM | POA: Diagnosis not present

## 2021-08-22 DIAGNOSIS — I350 Nonrheumatic aortic (valve) stenosis: Secondary | ICD-10-CM | POA: Diagnosis not present

## 2021-08-22 DIAGNOSIS — J441 Chronic obstructive pulmonary disease with (acute) exacerbation: Secondary | ICD-10-CM | POA: Diagnosis not present

## 2021-08-22 DIAGNOSIS — I96 Gangrene, not elsewhere classified: Secondary | ICD-10-CM | POA: Diagnosis not present

## 2021-08-22 DIAGNOSIS — I5043 Acute on chronic combined systolic (congestive) and diastolic (congestive) heart failure: Secondary | ICD-10-CM | POA: Diagnosis not present

## 2021-08-22 DIAGNOSIS — I739 Peripheral vascular disease, unspecified: Secondary | ICD-10-CM

## 2021-08-22 DIAGNOSIS — I700 Atherosclerosis of aorta: Secondary | ICD-10-CM | POA: Diagnosis not present

## 2021-08-22 LAB — BASIC METABOLIC PANEL
Anion gap: 4 — ABNORMAL LOW (ref 5–15)
BUN: 35 mg/dL — ABNORMAL HIGH (ref 8–23)
CO2: 33 mmol/L — ABNORMAL HIGH (ref 22–32)
Calcium: 8.7 mg/dL — ABNORMAL LOW (ref 8.9–10.3)
Chloride: 97 mmol/L — ABNORMAL LOW (ref 98–111)
Creatinine, Ser: 1.43 mg/dL — ABNORMAL HIGH (ref 0.61–1.24)
GFR, Estimated: 53 mL/min — ABNORMAL LOW (ref >60)
Glucose, Bld: 186 mg/dL — ABNORMAL HIGH (ref 70–99)
Potassium: 4.4 mmol/L (ref 3.5–5.1)
Sodium: 134 mmol/L — ABNORMAL LOW (ref 135–145)

## 2021-08-22 LAB — HEPARIN LEVEL (UNFRACTIONATED): Heparin Unfractionated: 0.43 IU/mL (ref 0.30–0.70)

## 2021-08-22 LAB — CBC
HCT: 27.8 % — ABNORMAL LOW (ref 39.0–52.0)
Hemoglobin: 8.7 g/dL — ABNORMAL LOW (ref 13.0–17.0)
MCH: 29.2 pg (ref 26.0–34.0)
MCHC: 31.3 g/dL (ref 30.0–36.0)
MCV: 93.3 fL (ref 80.0–100.0)
Platelets: 230 10*3/uL (ref 150–400)
RBC: 2.98 MIL/uL — ABNORMAL LOW (ref 4.22–5.81)
RDW: 16.4 % — ABNORMAL HIGH (ref 11.5–15.5)
WBC: 8.2 10*3/uL (ref 4.0–10.5)
nRBC: 0 % (ref 0.0–0.2)

## 2021-08-22 LAB — GLUCOSE, CAPILLARY
Glucose-Capillary: 183 mg/dL — ABNORMAL HIGH (ref 70–99)
Glucose-Capillary: 191 mg/dL — ABNORMAL HIGH (ref 70–99)
Glucose-Capillary: 121 mg/dL — ABNORMAL HIGH (ref 70–99)
Glucose-Capillary: 164 mg/dL — ABNORMAL HIGH (ref 70–99)

## 2021-08-22 LAB — PROTIME-INR
INR: 1.7 — ABNORMAL HIGH (ref 0.8–1.2)
Prothrombin Time: 19.4 seconds — ABNORMAL HIGH (ref 11.4–15.2)

## 2021-08-22 LAB — MAGNESIUM: Magnesium: 1.7 mg/dL (ref 1.7–2.4)

## 2021-08-22 IMAGING — CT CT ANGIO CHEST
2 of 16 series · 15 of 37 positions shown · IV contrast (APPLIED)
Comparison: Chest CT dated [DATE]

CLINICAL DATA: Preop evaluation for TAVR

EXAM:
CT ANGIOGRAPHY CHEST, ABDOMEN AND PELVIS
TECHNIQUE: Non-contrast CT of the chest was initially obtained.

[Series 5: 0-90% · axial · 0.43mm/px · z∈[-173,+6]mm · 8 of 3830 slices shown]
[im 426/3830  lung]
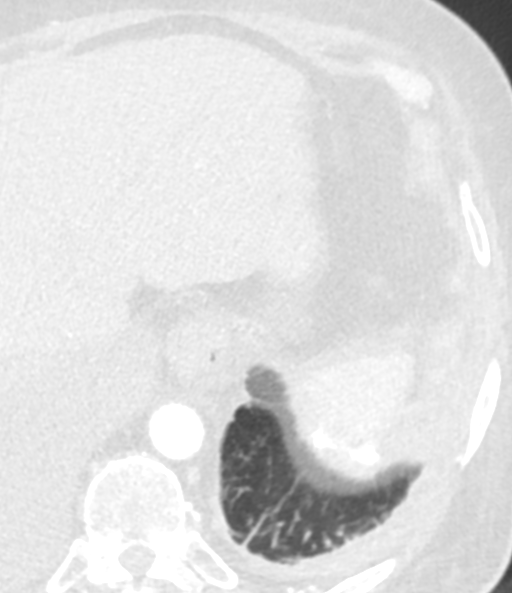
[im 851/3830  mediastinal]
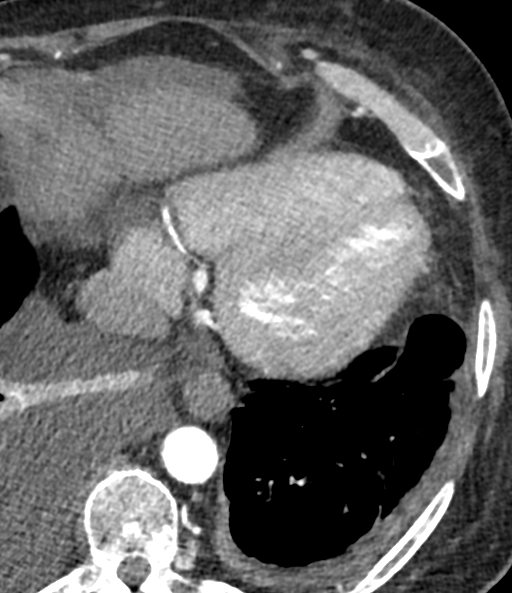
[im 1277/3830  lung]
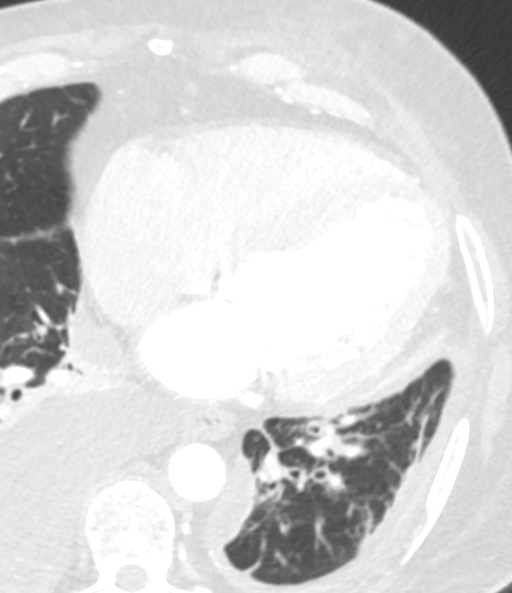
[im 1702/3830  mediastinal]
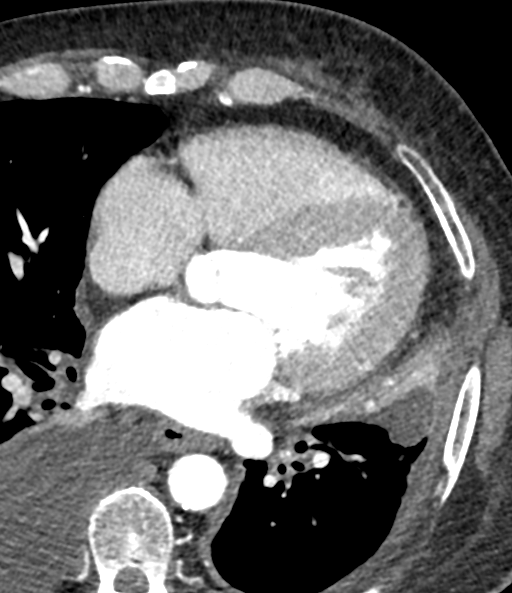
[im 2128/3830  lung]
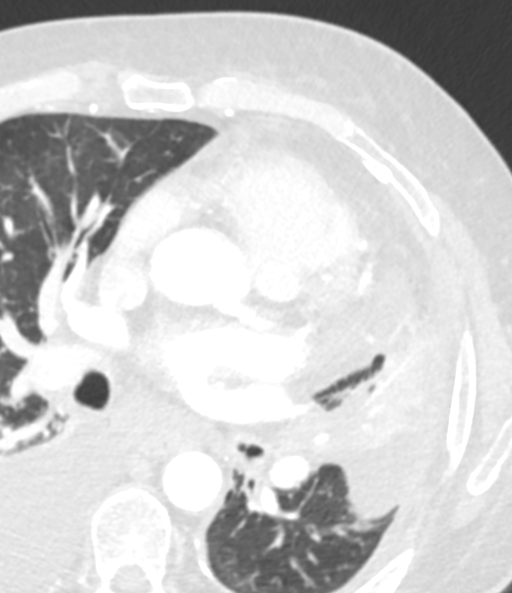
[im 2553/3830  mediastinal]
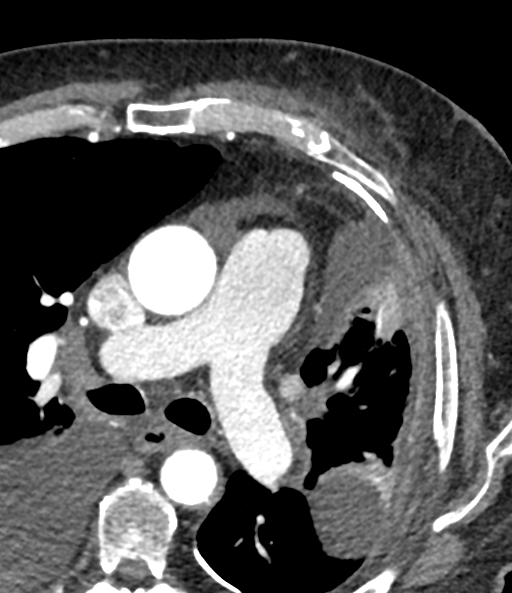
[im 2979/3830  lung]
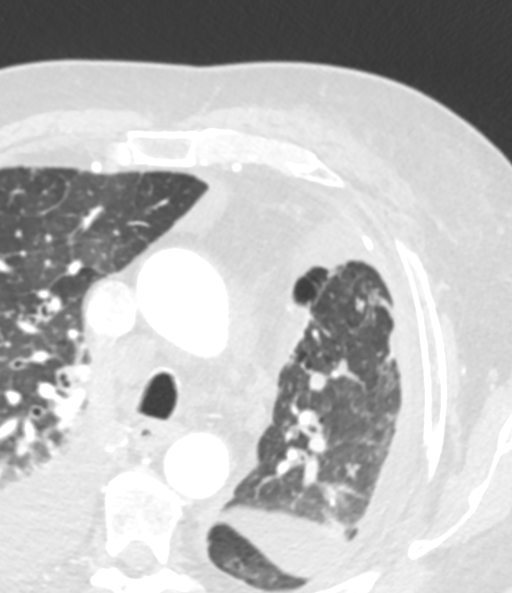
[im 3404/3830  mediastinal]
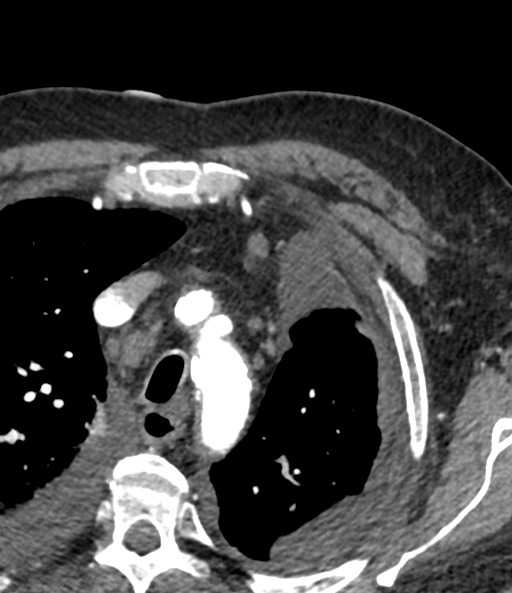

[Series 6: 5-95% · axial · 0.45mm/px · z∈[-172,-18]mm · 7 of 3850 slices shown]
[im 428/3850  lung]
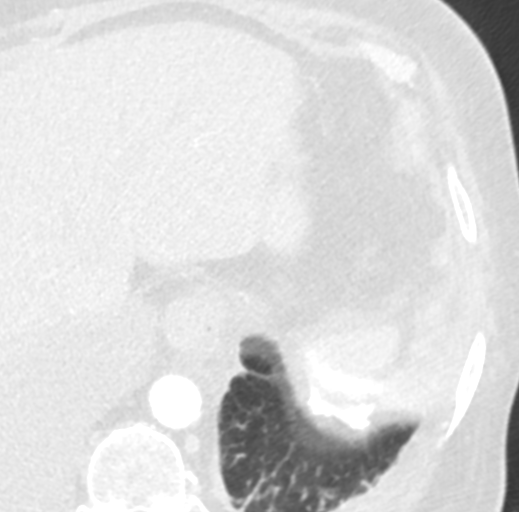
[im 856/3850  lung]
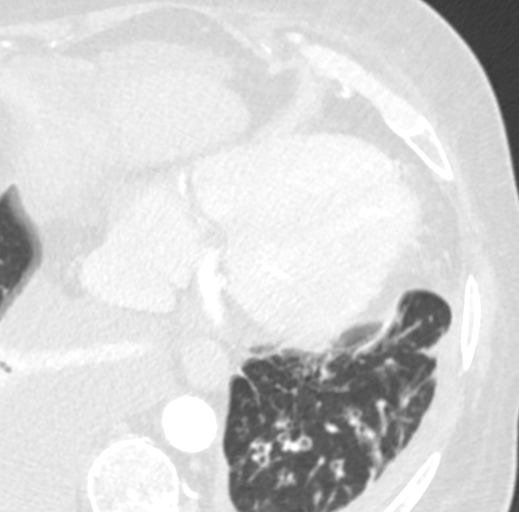
[im 1284/3850  lung]
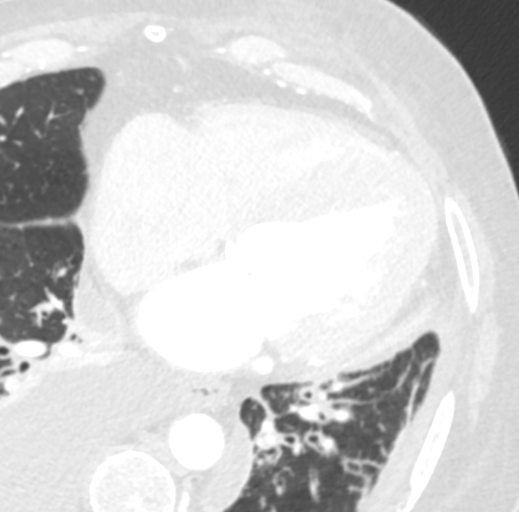
[im 1711/3850  lung]
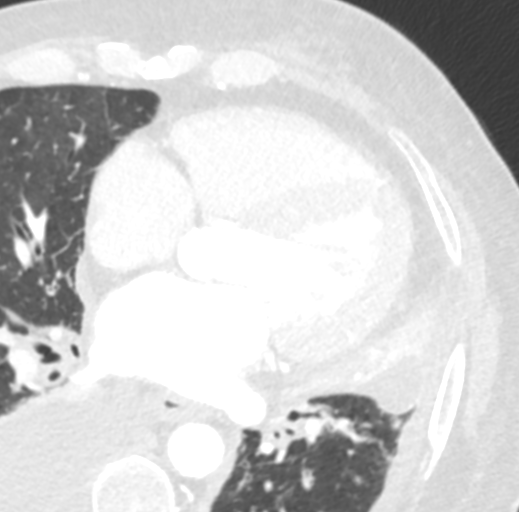
[im 2139/3850  lung]
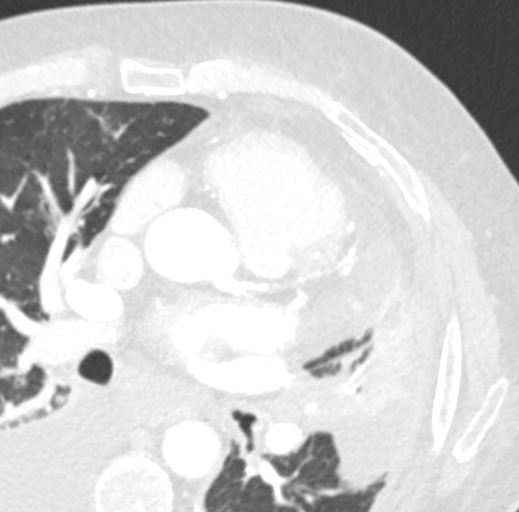
[im 2567/3850  lung]
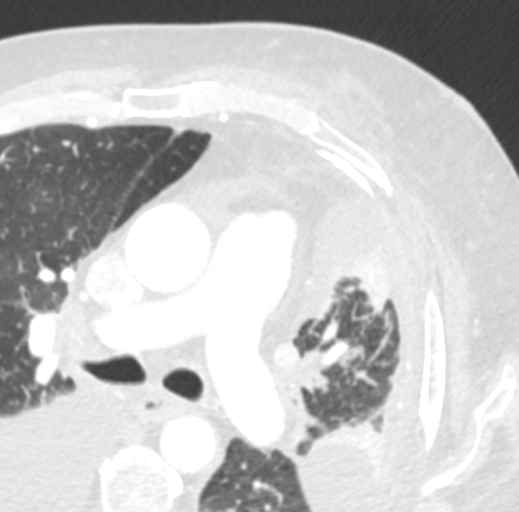
[im 2994/3850  lung]
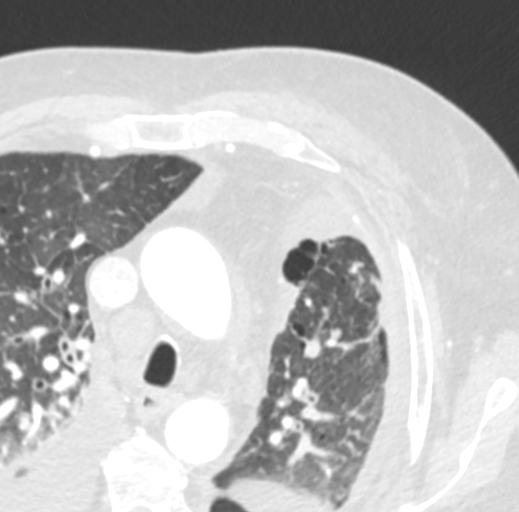

[15 of 37 positions shown; findings below may reference images not displayed]

Multidetector CT imaging through the chest, abdomen and pelvis was
performed using the standard protocol during bolus administration of
intravenous contrast. Multiplanar reconstructed images and MIPs were
obtained and reviewed to evaluate the vascular anatomy.

RADIATION DOSE REDUCTION: This exam was performed according to the
departmental dose-optimization program which includes automated
exposure control, adjustment of the mA and/or kV according to
patient size and/or use of iterative reconstruction technique.

CONTRAST:  100mL OMNIPAQUE IOHEXOL 350 MG/ML SOLN
FINDINGS: CTA CHEST FINDINGS

Cardiovascular: Cardiomegaly. Mitral annular calcifications. Left
main and three-vessel coronary artery calcifications. Moderate
atherosclerotic disease of the thoracic aorta. No suspicious filling
defects of the central pulmonary arteries.

Mediastinum/Nodes: Small hiatal hernia. Thyroid is unremarkable.
Enlarged mediastinal lymph nodes, unchanged with recent prior exam.
Reference right lower paratracheal lymph node measuring 2.0 cm in
short axis located on series 11, image 107.

Lungs/Pleura: Central airways are patent. Right pleural
calcifications. Bilateral bronchial wall thickening, interlobular
septal thickening, and mild diffuse ground-glass opacities.
Paraseptal emphysema. Moderate right pleural effusion and small
loculated left pleural effusion with associated atelectasis.

Musculoskeletal: No chest wall abnormality. No acute or significant
osseous findings.

CTA ABDOMEN AND PELVIS FINDINGS

Hepatobiliary: No suspicious liver lesions. No gallbladder wall
thickening. No biliary ductal dilation.

Pancreas: Unremarkable. No pancreatic ductal dilatation or
surrounding inflammatory changes.

Spleen: Normal in size without focal abnormality.

Adrenals/Urinary Tract: Bilateral adrenal glands are unremarkable.
No hydronephrosis or nephrolithiasis. Bilateral areas of cortical
irregularity, likely due to scarring. Bladder is unremarkable.

Stomach/Bowel: Stomach is within normal limits. Appendix appears
normal. No evidence of bowel wall thickening, distention, or
inflammatory changes.

Vascular/lymphatic: Normal caliber thoracic aorta with moderate to
severe calcified and noncalcified plaque. Aortic branch vessels are
patent, severe narrowing of the proximal celiac artery, likely due
to a combination of diaphragmatic compression and calcified plaque
and moderate narrowing of the SMA due to calcified plaque. Near
complete occlusion at the bifurcation of the left profunda femoris
and SFA. Stent of the proximal right SFA. No pathologically enlarged
lymph nodes seen in the abdomen or pelvis. Rim enhancing inguinal
fluid collection measuring 2.7 x 2.0 cm on series 12, image 214.

Reproductive: Prostatomegaly.

Other: Small fat containing inguinal hernias. Soft tissue nodules
seen in the subcutaneous soft tissues of the lower abdomen, likely
injection granulomas.

Musculoskeletal: Intramedullary rod seen in the proximal right
femur. No aggressive appearing osseous lesions.

VASCULAR MEASUREMENTS PERTINENT TO TAVR:

AORTA:

Minimal Aortic [DZ] mm

Severity of Aortic Calcification-severe

RIGHT PELVIS:

Right Common Iliac Artery -

Minimal [DZ] mm

Tortuosity-none

Calcification-severe

Right External Iliac Artery -

Minimal [DZ] mm

Tortuosity-none

Calcification-severe

Right Common Femoral Artery -

Minimal Diameter- 5.5 mm

Tortuosity-none

Calcification-moderate

LEFT PELVIS:

Left Common Iliac Artery -

Minimal [DZ] mm

Tortuosity-none

Calcification-severe

Left External Iliac Artery -

Minimal [DZ] mm

Tortuosity-none

Calcification-severe

Left Common Femoral Artery -

Minimal [DZ] mm

Tortuosity-none

Calcification-severe

Review of the MIP images confirms the above findings.
IMPRESSION: Vascular:

1. Vascular findings and measurements pertinent to potential TAVR
procedure, as detailed above.
2. Severe thickening calcification of the aortic valve, compatible
with reported clinical history of severe aortic stenosis.
3. Severe aortoiliac atherosclerosis. Near complete occlusion at the
bifurcation of the left profunda femoris and SFA. Left main and 3
vessel coronary artery disease.

Nonvascular:

1. Cardiomegaly and pulmonary edema.
2. Moderate right pleural effusion and small loculated left pleural
effusion.
3. Unchanged enlarged mediastinal lymph nodes, see prior exam for
follow-up recommendations.
4. Small volume abdominal ascites.
5. Rim enhancing left inguinal fluid collection measuring 2.7 x
cm, possibly a necrotic lymph node, hematoma is an additional
consideration if there is history of prior vascular access.
Correlate with clinical history and consider ultrasound for further
evaluation.

## 2021-08-22 IMAGING — CT CT HEART MORP W/ CTA COR W/ SCORE W/ CA W/CM &/OR W/O CM
1 series · 14 of 20 positions shown, 18 images · IV contrast (omnipaque)
Comparison: None.
COMPARISON: None.

Addendum:
EXAM:
OVER-READ INTERPRETATION  CT CHEST

The following report is an over-read performed by radiologist Dr.
QUI [REDACTED] on [DATE]. This
over-read does not include interpretation of cardiac or coronary
anatomy or pathology. The coronary calcium score/coronary CTA
interpretation by the cardiologist is attached.
CLINICAL DATA: Aortic valve replacement (TAVR), pre-op eval
Cardiac TAVR CT
TECHNIQUE: The patient was scanned on a Siemens Force [REDACTED]ice scanner. A 120
kV retrospective scan was triggered in the descending thoracic aorta
at 111 HU's. Gantry rotation speed was 270 msecs and collimation was
.9 mm. The 3D data set was reconstructed in 5% intervals of the R-R
cycle. Systolic and diastolic phases were analyzed on a dedicated
work station using MPR, MIP and VRT modes. The patient received
100mL OMNIPAQUE IOHEXOL 350 MG/ML SOLN of contrast.

[Series 3: ds_cascseq 3.0 sa36 70% (id) · axial · 0.50mm/px · z∈[-183,+15]mm · 14 of 148 slices shown, 18 images]
[im 8/148  vessel]
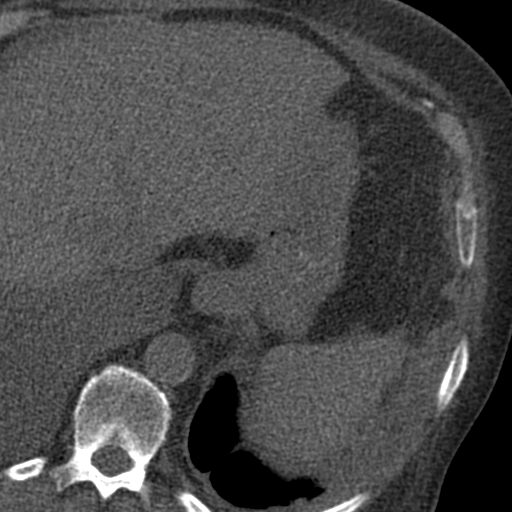
[im 8/148  lung]
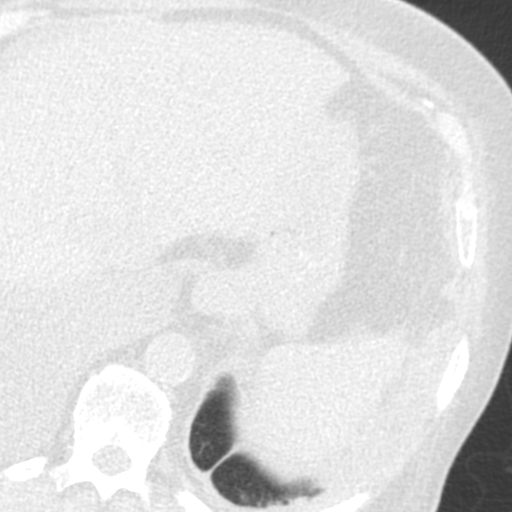
[im 16/148  vessel]
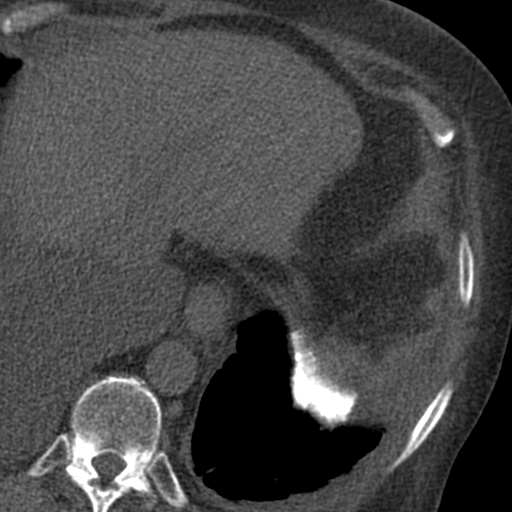
[im 31/148  vessel]
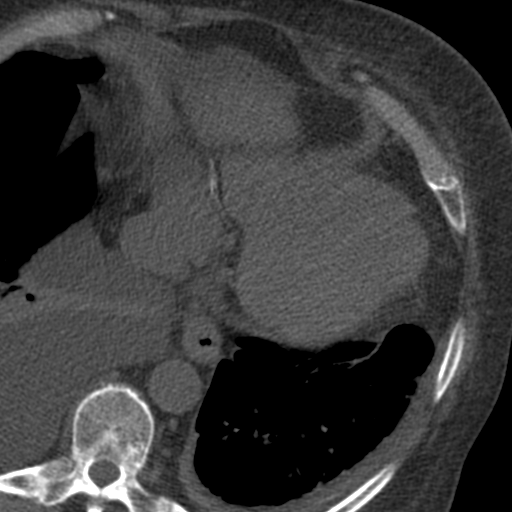
[im 39/148  vessel]
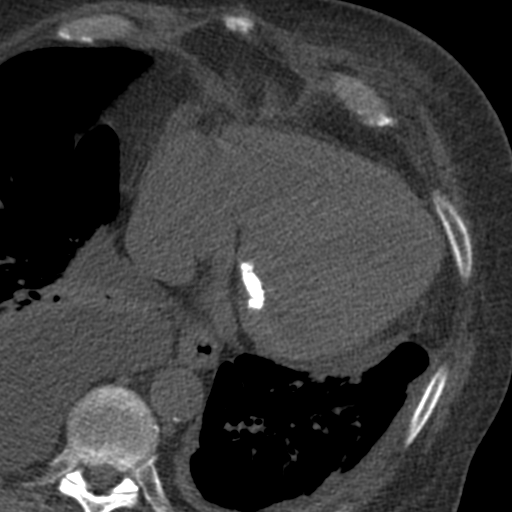
[im 47/148  vessel]
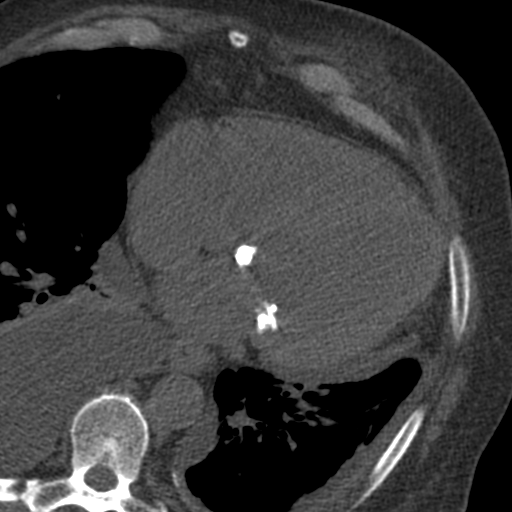
[im 47/148  lung]
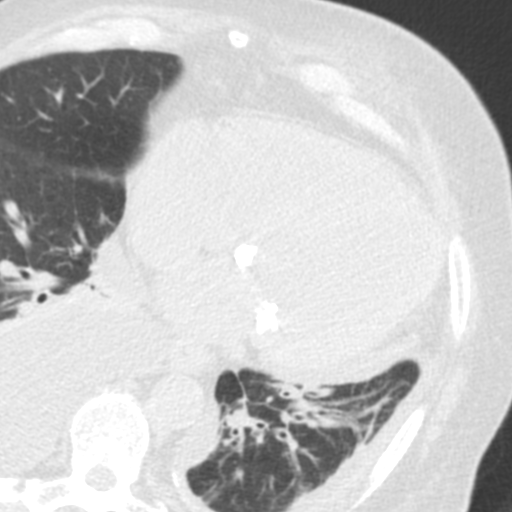
[im 62/148  vessel]
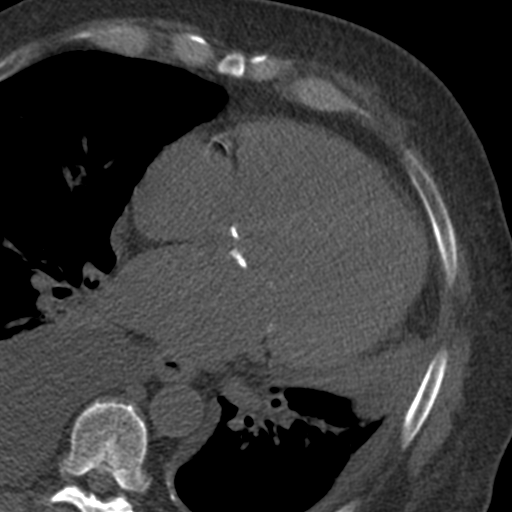
[im 70/148  vessel]
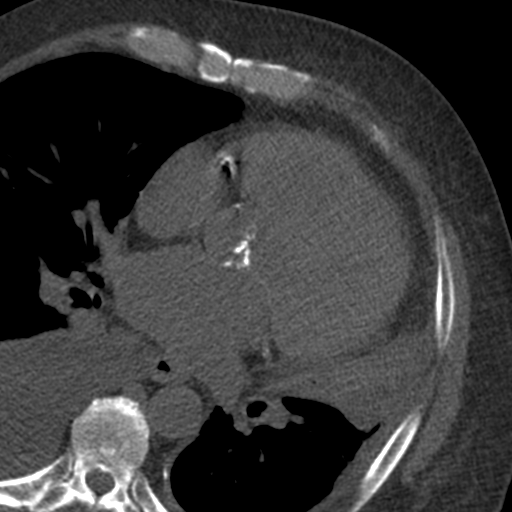
[im 78/148  vessel]
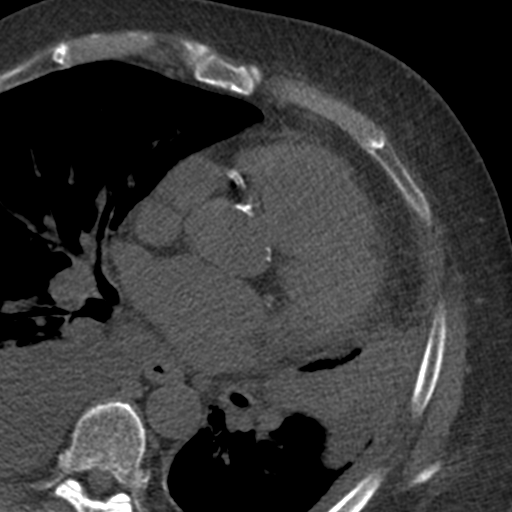
[im 86/148  vessel]
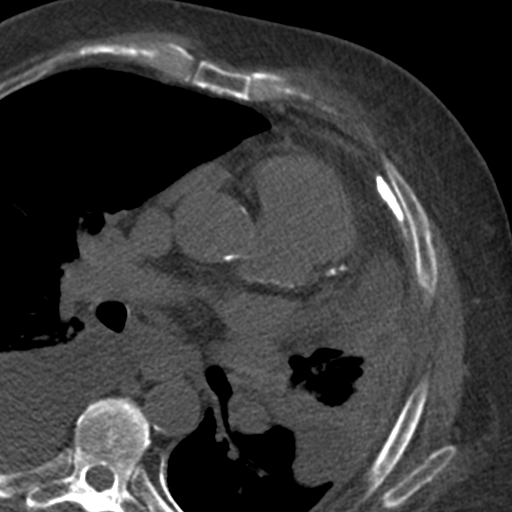
[im 86/148  lung]
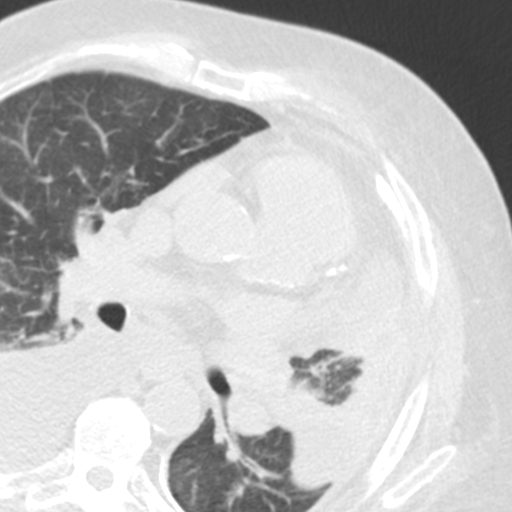
[im 101/148  vessel]
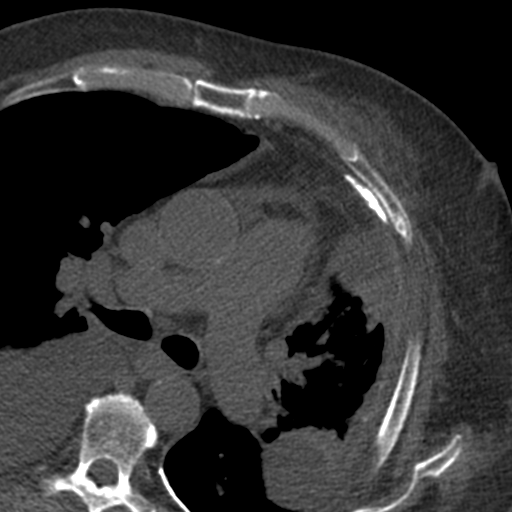
[im 109/148  vessel]
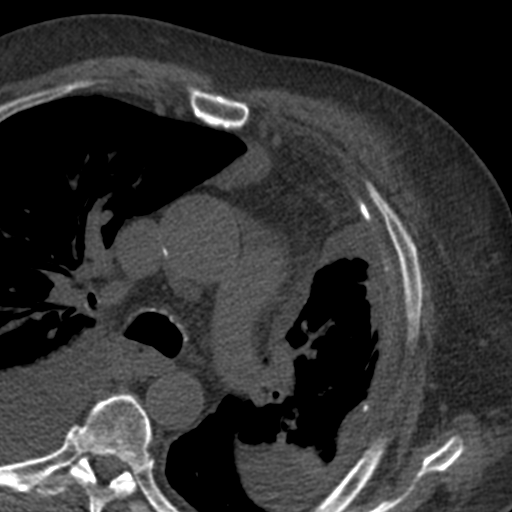
[im 117/148  vessel]
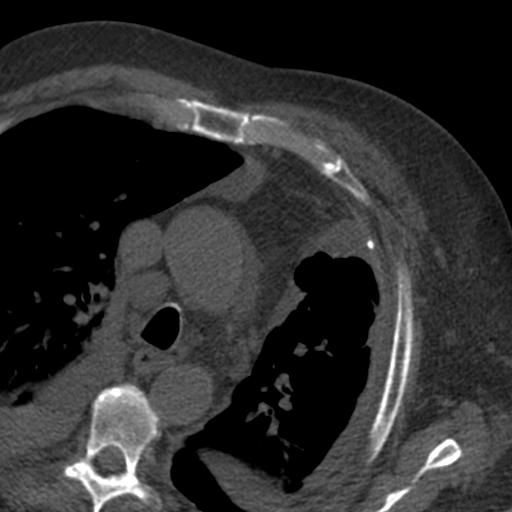
[im 132/148  vessel]
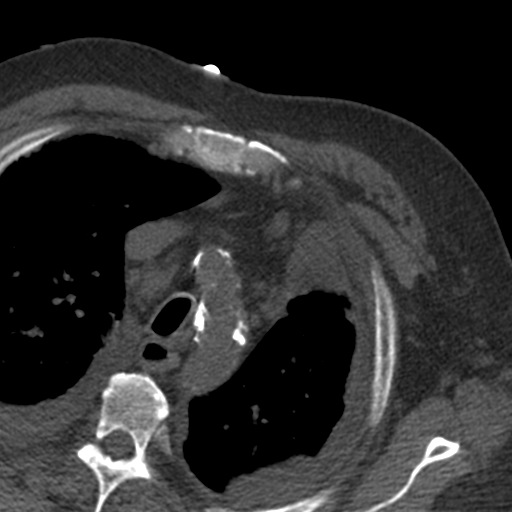
[im 132/148  lung]
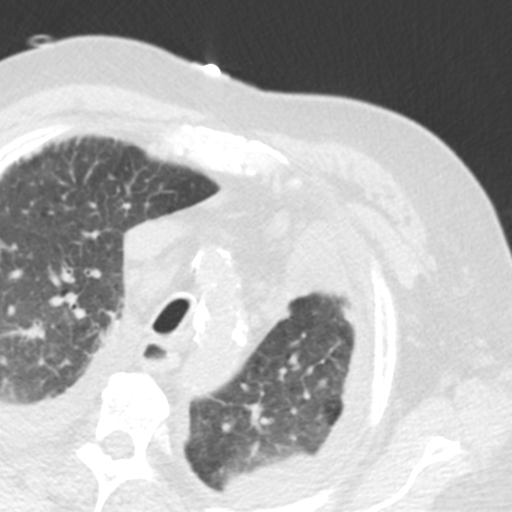
[im 140/148  vessel]
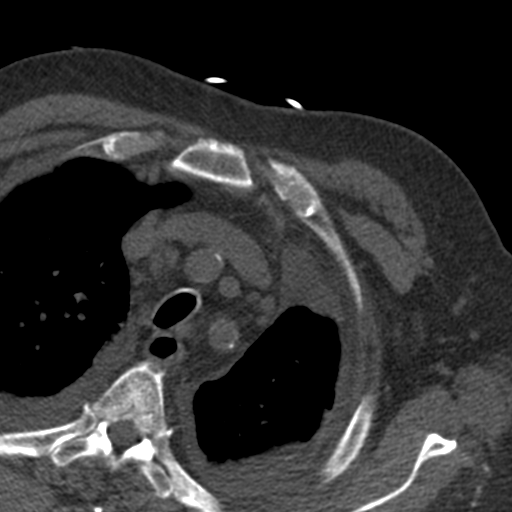

[14 of 20 positions shown; findings below may reference images not displayed]

FINDINGS: Extracardiac findings will be described separately under dictation
for contemporaneously obtained CTA chest, abdomen and pelvis.
IMPRESSION: Please see separate dictation for contemporaneously obtained CTA
chest, abdomen and pelvis dated [DATE] for full description
of relevant extracardiac findings.
FINDINGS: Aortic Valve: Tricuspid aortic valve. Severely reduced cusp
separation. Severely thickened, severely calcified aortic valve
cusps.

AV calcium score: [JQ]

Virtual Basal Annulus Measurements:

Maximum/Minimum Diameter: 30.9 x 23.0 mm

Perimeter: 85.1 mm

Area:  549 mm2

No significant LVOT calcifications.

Based on these measurements, the annulus would be suitable for a 29
mm Sapien 3 valve.

Sinus of Valsalva Measurements:

Non-coronary:  35 mm

Right - coronary:  34 mm

Left - coronary:  36 mm

Sinus of Valsalva Height:

Left: 25.6 mm

Right: 26.3 mm

Aorta: Severe aortic atherosclerosis. Conventional 3 vessel branch
pattern of aortic arch

Sinotubular Junction:  32 mm

Ascending Thoracic Aorta:  38 mm

Aortic Arch:  27 mm

Descending Thoracic Aorta:  26 mm

Coronary Artery Height above Annulus:

Left Main: 20.2 mm

Right Coronary: 18.5 mm

Coronary Arteries: 3 vessel coronary artery calcifications.

Optimum Fluoroscopic Angle for Delivery: LAO 13, QUI 15

Moderate mitral annular calcifications.

No left atrial appendage thrombus.
IMPRESSION: 1. Tricuspid aortic valve with severely reduced cusp excursion.
Severely thickened and severely calcified aortic valve cusps.

2.  Aortic valve calcium score: [JQ]

3. Annulus area: 549 mm2, suitable for 29 mm Sapien 3 valve. Mild
LVOT calcifications.

4.  Sufficient coronary artery heights from annulus.

5.  Optimum fluoroscopic angle for delivery: LAO 13, QUI 15

*** End of Addendum ***
EXAM:
OVER-READ INTERPRETATION  CT CHEST

The following report is an over-read performed by radiologist Dr.
QUI [REDACTED] on [DATE]. This
over-read does not include interpretation of cardiac or coronary
anatomy or pathology. The coronary calcium score/coronary CTA
interpretation by the cardiologist is attached.
FINDINGS: Extracardiac findings will be described separately under dictation
for contemporaneously obtained CTA chest, abdomen and pelvis.
IMPRESSION: Please see separate dictation for contemporaneously obtained CTA
chest, abdomen and pelvis dated [DATE] for full description
of relevant extracardiac findings.

## 2021-08-22 MED ORDER — IPRATROPIUM-ALBUTEROL 0.5-2.5 (3) MG/3ML IN SOLN
3.0000 mL | Freq: Three times a day (TID) | RESPIRATORY_TRACT | Status: DC
  Administered 2021-08-23 – 2021-08-24 (×3): 3 mL via RESPIRATORY_TRACT
  Filled 2021-08-22 (×3): qty 3

## 2021-08-22 MED ORDER — MAGNESIUM SULFATE 2 GM/50ML IV SOLN
2.0000 g | Freq: Once | INTRAVENOUS | Status: AC
  Administered 2021-08-22: 2 g via INTRAVENOUS
  Filled 2021-08-22: qty 50

## 2021-08-22 MED ORDER — HEPARIN (PORCINE) 25000 UT/250ML-% IV SOLN
1500.0000 [IU]/h | INTRAVENOUS | Status: DC
  Administered 2021-08-22 – 2021-08-25 (×5): 1500 [IU]/h via INTRAVENOUS
  Filled 2021-08-22 (×5): qty 250

## 2021-08-22 MED ORDER — METOPROLOL TARTRATE 50 MG PO TABS
50.0000 mg | ORAL_TABLET | Freq: Once | ORAL | Status: AC
  Administered 2021-08-22: 50 mg via ORAL
  Filled 2021-08-22: qty 1

## 2021-08-22 MED ORDER — IOHEXOL 350 MG/ML SOLN
100.0000 mL | Freq: Once | INTRAVENOUS | Status: AC | PRN
Start: 2021-08-22 — End: 2021-08-22
  Administered 2021-08-22: 100 mL via INTRAVENOUS

## 2021-08-22 NOTE — Progress Notes (Signed)
PT Cancellation Note ? ?Patient Details ?Name: Rodney [name] ?MRN: [id number] ?DOB: 1950/07/30 ? ? ?Cancelled Treatment:    Reason Eval/Treat Not Completed: (P) Patient declined, no reason specified ?Pt reports being disgusted, "They put me on my back in CT and I can't breathe on my back. They also told me that I need to have my toe amputated so that I can get my heart valve fixed. And I won't see my girlfriend this weekend because she has pneumonia. I don't want to do anything." ? ?PT will follow back next week ? ?Jalyric Kaestner B. Migdalia Dk PT, DPT ?Acute Rehabilitation Services ?Please use secure chat or  ?Call Office [phone] ?  ? ?[place] ?08/22/2021, 3:13 PM ? ? ?

## 2021-08-22 NOTE — Progress Notes (Signed)
?  [facility]   ?MULTIDISCIPLINARY HEART VALVE TEAM  ? ?Orders have been placed for pre TAVR CT scans to be obtained today, 08/22/21. He will stay over the weekend with plans to pursue [facility] 08/25/21 if renal function stable. We will set him up to see OP dentist after discharge for possible extractions as our inpatient dentist is unable to see the patient this hospitalization.  ? ? ?Kathyrn Drown NP-C ?Structural Heart Team  ?Pager: [phone] ?Phone: [phone] ? ?

## 2021-08-22 NOTE — Progress Notes (Addendum)
?  [facility]   ?MULTIDISCIPLINARY HEART VALVE TEAM  ? ?Additionally, the patient is anticoagulated with Coumadin for persistent atrial fibrillation. INR today at 1.7. Plan to hold today until cath. Will start IV Heparin for coverage in the meantime. Will let pharmD team know this plan.  ? ?Kathyrn Drown NP-C ?Structural Heart Team  ?Pager: [phone] ?Phone: [phone] ? ?

## 2021-08-22 NOTE — Progress Notes (Signed)
Family Medicine Teaching Service ?Daily Progress Note ?Intern Pager: [phone] ? ?Patient name: YAKOV BERGEN Medical record number: [id number] ?Date of birth: 11-Apr-1951 Age: 71 y.o. Gender: male ? ?Primary Care Provider: [name], MD ?Consultants: Cardiology, VVS ?Code Status: Full ? ?Pt Overview and Major Events to Date:  ?4/26- admitted ? ?Assessment and Plan: ?ANJEL PARDO is a 71 y.o. male presenting with acute on chronic hypoxic respiratory failure, presumed 2/2 COPD exacerbation. PMH is significant for DM 2, obesity, tobacco abuse, hypertension, protein calorie malnutrition, aortic stenosis, anemia chronic disease, PVD, HFrEF, PAF, status post BKA, CAD, CKD stage III, COPD ? ?Acute on Chronic Hypoxic Respiratory Failure Likely 2/2 COPD Exacerbation ?Remains stable on 4L [place]--I was able to wean him to 3L during my assessment. Remains afebrile and without a leukocytosis. Suspect that his severe aortic stenosis as discussed below is also a major contributor to his dyspnea.  ?- Day 4/5 Prednisone and Cefdinir ?- Continue Breo + Incruse daily ?- Duonebs q6h scheduled with q2h PRN ?- Outpatient PET follow-up for increasing size mediastinal lymph nodes ?- SpO2 goal 88-92% ? ?Severe Aortic Stenosis  HF with Recovered EF ?Ongoing workup for potential TAVR. Will need source control for potential infection risk with gangrenous toe amputation and likely dental extraction. LE duplex showed patent stent of RLE. Orthopantogram with multiple caries. Appears euvolemic today. Will need cath as part of workup.  ?- Onogoing TAVR workup per cardiology, VVS ?- Likely needs dental consult for extractions ?- Continue home Lasix ? ?Bilateral pleural effusions ?As he is back to his home O2 requirement, do not believe these are contributing to his presentation at this time. Afebrile and without evidence of systemic infection. ?- Continue expectant management ? ?Anemia of Chronic Kidney Disease ?Hgb stable 8.2>8.7. No  evidence of active blood loss.  ?- Holding home iron ?- Consider IV iron prior to discharge ? ?AKI on CKD IIIb ?AM BMP pending.  ?- Will need to monitor Cr especially if moving forward with cath for TAVR workup ? ?Hypomagnesemia ?Mag 1.7 this am.  ?- Replete to Mag > 2  ? ?T2DM ?Glucose 183-224 over past 24 hours. Received 15 units LAI and 12 units SAI. ?- CBGs, mSSI ?- Continue Levemir 15 units daily ? ?Paroxysmal A Fib ?Remains in A fib this morning. Rate controlled. ?- Cards following as above ?- Continue home Coreg and Coumadin ? 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 Duplex yesterday showed patent stent for RLE.  ?- Toe amputation per VVS ? ?Chronic, stable conditions ?GERD- Continue home Protonix '40mg'$  BID ?Insomnia - Continue home trazodone PRN QHS ? ?FEN/GI: Carb-modified ?PPx: On coumadin ?Dispo: Pending further workup and intervention, possibility of TAVR    ? ?Subjective:  ?Mr. Boerner reports that he feels generally okay this morning.  He denies any acute change or worsening in his symptoms.  He thinks that his breathing is about mood is baseline at this time.  He expresses dismay at the repeated hospitalizations that he has had over the past couple of months.  He reports not knowing what to think about the potential for aggressive intervention for his aortic stenosis.  He is hopeful that this will help to keep him out of the hospital in the future if he is able to pursue this. ? ?Objective: ?Temp:  [97.5 ?F (36.4 ?C)-98.2 ?F (36.8 ?C)] 97.9 ?F (36.6 ?C) (  04/28 0309) ?Pulse Rate:  [43-112] 76 (04/28 0309) ?Resp:  [0-31] 14 (04/28 0309) ?BP: (125-153)/(70-87) 153/75 (04/28 0309) ?SpO2:  [95 %-100 %] 100 % (04/28 0309) ?Weight:  [98.1 kg] 98.1 kg (04/28 0309) ?Physical Exam: ?General: Awake, alert, sitting up eating breakfast ?Cardiovascular: Irregularly irregular, 3/6 systolic murmur, stable from my  previous exams ?Respiratory: Normal work of breathing on 3 L, mild expiratory wheeze throughout, no foci of crackles or diminished lung sounds ?Abdomen: Obese, soft, nontender ?Extremities: Right second toe with stable gangrenous changes, no evidence of infectious transformation, status post left BKA ? ?Laboratory: ?Recent Labs  ?Lab 08/20/21 ?0235 08/20/21 ?7322 08/20/21 ?1422 08/21/21 ?0344 08/22/21 ?0256  ?WBC 5.6  --   --  7.7 8.2  ?HGB 7.6*   < > 8.5* 8.2* 8.7*  ?HCT 24.9*   < > 27.6* 26.2* 27.8*  ?PLT 245  --   --  247 230  ? < > = values in this interval not displayed.  ? ?Recent Labs  ?Lab 08/19/21 ?0254 08/19/21 ?2706 08/19/21 ?1319 08/20/21 ?0235 08/21/21 ?0344  ?NA 140   < > 138 137 136  ?K 4.4   < > 4.4 4.6 4.5  ?CL 98  --   --  98 97*  ?CO2 35*  --   --  33* 33*  ?BUN 31*  --   --  34* 37*  ?CREATININE 2.15*  --   --  1.74* 1.79*  ?CALCIUM 8.5*  --   --  8.4* 8.5*  ?GLUCOSE 219*  --   --  204* 168*  ? < > = values in this interval not displayed.  ? ?Imaging/Diagnostic Tests: ?DG Orthopantogram ?CLINICAL DATA:  Preoperative testing ? ?EXAM: ?ORTHOPANTOGRAM/PANORAMIC ? ?COMPARISON:  None. ? ?FINDINGS: ?Prominent dental caries are present within teeth 6, 7, 9, 10. ?Smaller caries are present in the remaining maxillary teeth. Modest ?caries are present teeth 18 and 19. ? ?The mandible is intact and located. No other osseous abnormalities ?are present. ? ?IMPRESSION: ?1. Prominent dental caries within teeth 6, 7, 9, 10. ?2. Modest caries involving teeth 18 and 19. ? ?Electronically Signed ?  By: San Morelle M.D. ?  On: 08/21/2021 20:44 ?VAS [place] LOWER EXTREMITY ARTERIAL DUPLEX ?LOWER EXTREMITY ARTERIAL DUPLEX STUDY ? ?Patient Name:  BOHDI LEEDS  Date of Exam:   08/21/2021 ?Medical Rec #: [id number]          Accession #:    [id number] ?Date of Birth: 12/18/1950         Patient Gender: M ?Patient Age:   61 years ?Exam Location:  [facility] ?Procedure:      VAS [place] LOWER EXTREMITY ARTERIAL  DUPLEX ?Referring Phys: Servando Snare ? ?-------------------------------------------------------------------------------- ?  ?Indications: Ulceration, peripheral artery disease, and Known ulceration to ?             right second toe, patient refusing amputation. ? ?High Risk Factors: Hypertension, hyperlipidemia, Diabetes, coronary artery ?                   disease. ? ?Other Factors: Severe AS, CHF,. ? Vascular Interventions: Left BKA. Right great toe amputation 11/15/20.. Right CI ?                        stent 08/05/20. Right SFA to below knee popliteal artery ?  bypass with GSV 08/07/20. Stent to SFA to below knee ?                        bypass graft 10/25/20. Right stent extended into distal ?                        CFA 05/12/2021. ?Current ABI:            N/A ? ?Limitations: Patient must sit completely upright in bed secondary to breathing ?             difficulty limiting views of proximal CFA ? ?Comparison Study: Prior right LEA done 06/23/21 showing patent stent ? ?Performing Technologist: Sharion Dove RVS ? ?  ?Examination Guidelines: A complete evaluation includes B-mode imaging, spectral ?Doppler, color Doppler, and power Doppler as needed of all accessible portions ?of each vessel. Bilateral testing is considered an integral part of a complete ?examination. Limited examinations for reoccurring indications may be performed ?as noted. ? ?  ? ?+----------+--------+-----+--------+-----------+--------+ ?RIGHT     PSV cm/sRatioStenosisWaveform   Comments ?+----------+--------+-----+--------+-----------+--------+ ?POP Prox  68                   multiphasic         ?+----------+--------+-----+--------+-----------+--------+ ?ATA Prox  72                   multiphasic         ?+----------+--------+-----+--------+-----------+--------+ ?ATA Mid   92                   multiphasic         ?+----------+--------+-----+--------+-----------+--------+ ?ATA Distal66                    multiphasic         ?+----------+--------+-----+--------+-----------+--------+ ?PTA Prox  27                   multiphasic         ?+----------+--------+-----+--------+-----------+--------+ ?PTA Mid   39                   multipha

## 2021-08-22 NOTE — Progress Notes (Addendum)
? ? ?[facility]   ?MULTIDISCIPLINARY HEART VALVE TEAM ? ?Patient Name: Rodney [name] ?Date of Encounter: 08/22/2021 ? ?Primary Cardiologist: Evalina Field, MD    ? ?Hospital Problem List  ?   ?Principal Problem: ?  Acute on chronic respiratory failure with hypoxia and hypercapnia (HCC) ?Active Problems: ?  Hypertension associated with chronic kidney disease due to type 2 diabetes mellitus ([place]) ?  DM (diabetes mellitus), type 2 with neurological complications ([place]) ?  HFrEF (heart failure with reduced ejection fraction) ([place]) ?  Severe aortic stenosis ?  AF (paroxysmal atrial fibrillation) ([place]) ?  Hx of BKA, left ([place]) ?  CAD (coronary artery disease) ?  Chronic respiratory failure with hypoxia and hypercapnia (HCC) ?  Acute kidney injury superimposed on chronic kidney disease ([place]) ?  Chronic pleural effusion, Left ?  COPD exacerbation ([place]) ?  History of multiple blood transfusions ?  History of amputation of right great toe ([place]) ?  Emphysema of lung ([place]) ?  Atherosclerosis of aorta ([place]) ?  Pleural effusion on right ?  Chronic blood loss anemia ?  ?Subjective  ? ?No specific complaints today. Denies chest pain, SOB, or palpitations.  ? ?Inpatient Medications  ?  ?Scheduled Meds: ? atorvastatin  80 mg Oral Daily  ? carvedilol  6.25 mg Oral BID WC  ? cefdinir  300 mg Oral Q12H  ? clopidogrel  75 mg Oral Daily  ? fluticasone furoate-vilanterol  1 puff Inhalation Daily  ? furosemide  40 mg Oral Daily  ? insulin aspart  0-20 Units Subcutaneous TID WC  ? insulin detemir  15 Units Subcutaneous QHS  ? ipratropium-albuterol  3 mL Nebulization Q6H  ? pantoprazole  40 mg Oral BID  ? predniSONE  40 mg Oral QAC breakfast  ? umeclidinium bromide  1 puff Inhalation Daily  ? Warfarin - Pharmacist Dosing Inpatient   Does not apply E1007  ? ?Continuous Infusions: ? ?PRN Meds: ?ipratropium-albuterol, traZODone  ? ?Vital Signs  ?  ?Vitals:  ? 08/21/21 2257 08/22/21 0116 08/22/21 0309 08/22/21 0740  ?BP: (!)  151/87  (!) 153/75 128/85  ?Pulse: 77 83 76 84  ?Resp: '20 17 14 20  '$ ?Temp: (!) 97.5 ?F (36.4 ?C)  97.9 ?F (36.6 ?C) (!) 97.4 ?F (36.3 ?C)  ?TempSrc: Oral  Oral Oral  ?SpO2: 99%  100% 100%  ?Weight:   98.1 kg   ?Height:      ? ? ?Intake/Output Summary (Last 24 hours) at 08/22/2021 0814 ?Last data filed at 08/22/2021 1219 ?Gross per 24 hour  ?Intake 360 ml  ?Output 1600 ml  ?Net -1240 ml  ? ?Filed Weights  ? 08/20/21 0400 08/21/21 0024 08/22/21 0309  ?Weight: 98.3 kg 91 kg 98.1 kg  ? ?Physical Exam  ?  ?General: Ill appearing, NAD ?Lungs: Diminished in bilateral upper and lower lobes. No wheezes, rales, or rhonchi. Breathing is unlabored. ?Cardiovascular: Irregularly irregular. Harsh systolic murmur ?Abdomen: Soft, non-tender, non-distended. No obvious abdominal masses. ?Extremities: Mild LE edema, L BKA  ?Neuro: Alert and oriented. No focal deficits. No facial asymmetry. MAE spontaneously. ?Psych: Responds to questions appropriately with normal affect.   ? ?Labs  ?  ?CBC ?Recent Labs  ?  08/21/21 ?0344 08/22/21 ?0256  ?WBC 7.7 8.2  ?HGB 8.2* 8.7*  ?HCT 26.2* 27.8*  ?MCV 93.2 93.3  ?PLT 247 230  ? ?Basic Metabolic Panel ?Recent Labs  ?  08/20/21 ?0235 08/21/21 ?0344 08/22/21 ?0256  ?NA 137 136  --   ?  K 4.6 4.5  --   ?CL 98 97*  --   ?CO2 33* 33*  --   ?GLUCOSE 204* 168*  --   ?BUN 34* 37*  --   ?CREATININE 1.74* 1.79*  --   ?CALCIUM 8.4* 8.5*  --   ?MG  --  1.6* 1.7  ? ?Liver Function Tests ?No results for input(s): AST, ALT, ALKPHOS, BILITOT, PROT, ALBUMIN in the last 72 hours. ?No results for input(s): LIPASE, AMYLASE in the last 72 hours. ?Cardiac Enzymes ?No results for input(s): CKTOTAL, CKMB, CKMBINDEX, TROPONINI in the last 72 hours. ?BNP ?Invalid input(s): POCBNP ?D-Dimer ?No results for input(s): DDIMER in the last 72 hours. ?Hemoglobin A1C ?Recent Labs  ?  08/20/21 ?0235  ?HGBA1C 5.9*  ? ?Fasting Lipid Panel ?No results for input(s): CHOL, HDL, LDLCALC, TRIG, CHOLHDL, LDLDIRECT in the last 72 hours. ?Thyroid  Function Tests ?No results for input(s): TSH, T4TOTAL, T3FREE, THYROIDAB in the last 72 hours. ? ?Invalid input(s): FREET3 ? ?Telemetry  ?  ?08/22/21 Atrial fibrillation with HR in the 80's - Personally Reviewed ? ?ECG  ?  ?No new tracing as of 08/22/21 - Personally Reviewed ? ?Radiology  ?  ?DG Orthopantogram ? ?Result Date: 08/21/2021 ?CLINICAL DATA:  Preoperative testing EXAM: ORTHOPANTOGRAM/PANORAMIC COMPARISON:  None. FINDINGS: Prominent dental caries are present within teeth 6, 7, 9, 10. Smaller caries are present in the remaining maxillary teeth. Modest caries are present teeth 18 and 19. The mandible is intact and located. No other osseous abnormalities are present. IMPRESSION: 1. Prominent dental caries within teeth 6, 7, 9, 10. 2. Modest caries involving teeth 18 and 19. Electronically Signed   By: San Morelle M.D.   On: 08/21/2021 20:44  ? ?VAS [place] LOWER EXTREMITY ARTERIAL DUPLEX ? ?Result Date: 08/21/2021 ?LOWER EXTREMITY ARTERIAL DUPLEX STUDY Patient Name:  Rodney [name]  Date of Exam:   08/21/2021 Medical Rec #: [id number]          Accession #:    [id number] Date of Birth: Mar 22, 1951         Patient Gender: M Patient Age:   71 years Exam Location:  [facility] Procedure:      VAS [place] LOWER EXTREMITY ARTERIAL DUPLEX Referring Phys: Servando Snare --------------------------------------------------------------------------------  Indications: Ulceration, peripheral artery disease, and Known ulceration to              right second toe, patient refusing amputation. High Risk Factors: Hypertension, hyperlipidemia, Diabetes, coronary artery                    disease. Other Factors: Severe AS, CHF,.  Vascular Interventions: Left BKA. Right great toe amputation 11/15/20.. Right CI                         stent 08/05/20. Right SFA to below knee popliteal artery                         bypass with GSV 08/07/20. Stent to SFA to below knee                         bypass graft 10/25/20. Right stent extended  into distal                         CFA 05/12/2021. Current ABI:  N/A Limitations: Patient must sit completely upright in bed secondary to breathing              difficulty limiting views of proximal CFA Comparison Study: Prior right LEA done 06/23/21 showing patent stent Performing Technologist: Sharion Dove RVS  Examination Guidelines: A complete evaluation includes B-mode imaging, spectral Doppler, color Doppler, and power Doppler as needed of all accessible portions of each vessel. Bilateral testing is considered an integral part of a complete examination. Limited examinations for reoccurring indications may be performed as noted.  +----------+--------+-----+--------+-----------+--------+ RIGHT     PSV cm/sRatioStenosisWaveform   Comments +----------+--------+-----+--------+-----------+--------+ POP Prox  68                   multiphasic         +----------+--------+-----+--------+-----------+--------+ ATA Prox  72                   multiphasic         +----------+--------+-----+--------+-----------+--------+ ATA Mid   92                   multiphasic         +----------+--------+-----+--------+-----------+--------+ ATA Distal66                   multiphasic         +----------+--------+-----+--------+-----------+--------+ PTA Prox  27                   multiphasic         +----------+--------+-----+--------+-----------+--------+ PTA Mid   39                   multiphasic         +----------+--------+-----+--------+-----------+--------+ PTA Distal24                   multiphasic         +----------+--------+-----+--------+-----------+--------+ Dorsalis pedis mid foot 59.  Right Stent(s): +--------------+--++-----------+----------------------------------------------+ Prox to Stent              Unable to insonate secondary to patient                                   postioning                                      +--------------+--++-----------+----------------------------------------------+ Proximal Stent60mltiphasic                                               +--------------+--++-----------+----------------------------------------------+ Mid Stent     349mtiphasic

## 2021-08-22 NOTE — Progress Notes (Signed)
FPTS Brief Progress Note ? ?S: Patient sleeping with television on ? ? ?O: ?BP (!) 151/87 (BP Location: Right Arm)   Pulse 83   Temp (!) 97.5 ?F (36.4 ?C) (Oral)   Resp 17   Ht 6' (1.829 m)   Wt 91 kg   SpO2 99%   BMI 27.22 kg/m?   ? ? ?A/P: ?Respiratory status is stable on 4 L nasal cannula.  He is undergoing work-up for TAVR evaluation.  Orthopantogram reveals multiple caries.  Lower extremity duplex reveals patent stent on the right. ?- Orders reviewed. Labs for AM ordered, which was adjusted as needed.  ?- If condition changes, plan includes give as needed DuoNeb and consider placing on BiPAP if developing respiratory distress.  ? ?Zola Button, MD ?08/22/2021, 1:22 AM ?PGY-2, [place] [facility] Night Resident  ?Please page [phone] with questions.  ?  ?

## 2021-08-22 NOTE — Progress Notes (Signed)
Inpatient Diabetes Program Recommendations ? ?AACE/ADA: New Consensus Statement on Inpatient Glycemic Control  ? ?Target Ranges:  Prepandial:   less than 140 mg/dL ?     Peak postprandial:   less than 180 mg/dL (1-2 hours) ?     Critically ill patients:  140 - 180 mg/dL  ? ? Latest Reference Range & Units 08/22/21 06:10 08/22/21 10:40  ?Glucose-Capillary 70 - 99 mg/dL 183 (H) 191 (H)  ? ? Latest Reference Range & Units 08/21/21 05:59 08/21/21 11:45 08/21/21 16:10 08/21/21 21:04  ?Glucose-Capillary 70 - 99 mg/dL 177 (H) 185 (H) 177 (H) 224 (H)  ? ?Review of Glycemic Control ? ?Diabetes history: DM2 ?Outpatient Diabetes medications: Levemir 15 units daily, Metformin 1000 mg daily ?Current orders for Inpatient glycemic control: Levemir 15 units QHS, Novolog 0-20 units TID with meals; Prednisone 40 mg daily ? ?Inpatient Diabetes Program Recommendations:   ? ?Insulin: If steroids are continued, please consider ordering Novolog 3 units TID with meals for meal coverage if patient eats at least 50% of meals. ? ?Thanks, ?Barnie Alderman, RN, MSN, CDE ?Diabetes Coordinator ?Inpatient Diabetes Program ?[phone] (Team Pager from 8am to 5pm) ? ? ? ?

## 2021-08-22 NOTE — Progress Notes (Addendum)
?  Progress Note ? ? ? ?08/22/2021 ?8:40 AM ?* No surgery found * ? ?Subjective:  some SOB despite 4L by [place] ? ? ?Vitals:  ? 08/22/21 0309 08/22/21 0740  ?BP: (!) 153/75 128/85  ?Pulse: 76 84  ?Resp: 14 20  ?Temp: 97.9 ?F (36.6 ?C) (!) 97.4 ?F (36.3 ?C)  ?SpO2: 100% 100%  ? ?Physical Exam: ?Extremities:  R 2nd toe tip dry gangrene ?Abdomen:  soft ?Neurologic: A&O ? ?CBC ?   ?Component Value Date/Time  ? WBC 8.2 08/22/2021 0256  ? RBC 2.98 (L) 08/22/2021 0256  ? HGB 8.7 (L) 08/22/2021 0256  ? HGB 8.4 (L) 05/19/2021 1200  ? HCT 27.8 (L) 08/22/2021 0256  ? HCT 28.2 (L) 05/19/2021 1200  ? PLT 230 08/22/2021 0256  ? PLT 255 05/19/2021 1200  ? MCV 93.3 08/22/2021 0256  ? MCV 84 05/19/2021 1200  ? MCH 29.2 08/22/2021 0256  ? MCHC 31.3 08/22/2021 0256  ? RDW 16.4 (H) 08/22/2021 0256  ? RDW 16.2 (H) 05/19/2021 1200  ? LYMPHSABS 0.8 08/19/2021 0545  ? MONOABS 0.8 08/19/2021 0545  ? EOSABS 0.3 08/19/2021 0545  ? BASOSABS 0.1 08/19/2021 0545  ? ? ?BMET ?   ?Component Value Date/Time  ? NA 136 08/21/2021 0344  ? NA 141 05/19/2021 1200  ? K 4.5 08/21/2021 0344  ? CL 97 (L) 08/21/2021 0344  ? CO2 33 (H) 08/21/2021 0344  ? GLUCOSE 168 (H) 08/21/2021 0344  ? BUN 37 (H) 08/21/2021 0344  ? BUN 49 (H) 05/19/2021 1200  ? CREATININE 1.79 (H) 08/21/2021 0344  ? CREATININE 1.19 10/09/2015 1554  ? CALCIUM 8.5 (L) 08/21/2021 0344  ? GFRNONAA 41 (L) 08/21/2021 0344  ? GFRNONAA 65 10/09/2015 1554  ? GFRAA 52 (L) 05/01/2020 1632  ? GFRAA 75 10/09/2015 1554  ? ? ?INR ?   ?Component Value Date/Time  ? INR 1.7 (H) 08/22/2021 0256  ? ? ? ?Intake/Output Summary (Last 24 hours) at 08/22/2021 0840 ?Last data filed at 08/22/2021 2956 ?Gross per 24 hour  ?Intake 360 ml  ?Output 1600 ml  ?Net -1240 ml  ? ? ? ?Assessment/Plan:  71 y.o. male with 2nd toe tip gangrene ? ?R leg bypass/stents widely patent by duplex ?Dr. Donzetta Matters will discuss potential 2nd toe partial amp with patient ? ? ? ?Dagoberto Ligas, PA-C ?[facility] ?[phone] ?08/22/2021 ?8:40 AM ? ? ?I have independently interviewed and examined patient and agree with PA assessment and plan above. Plan for right 2nd toe amp early next week.  ? ?Kerigan Narvaez C. Donzetta Matters, MD ?[facility] ?Office: [phone] ?Pager: [phone] ? ?

## 2021-08-22 NOTE — Progress Notes (Signed)
ANTICOAGULATION CONSULT NOTE - Follow-Up Consult ? ?Pharmacy Consult for warfarin to IV heparin  ?Indication: atrial fibrillation ? ?No Known Allergies ? ?Patient Measurements: ?Height: 6' (182.9 cm) ?Weight: 98.1 kg (216 lb 4.3 oz) ?IBW/kg (Calculated) : 77.6 ?Heparin dosing weight: 96 kg ? ?Vital Signs: ?Temp: 97.4 ?F (36.3 ?C) (04/28 0740) ?Temp Source: Oral (04/28 0740) ?BP: 128/85 (04/28 0740) ?Pulse Rate: 84 (04/28 0740) ? ?Labs: ?Recent Labs  ?  08/19/21 ?0909 08/19/21 ?0263 08/20/21 ?0235 08/20/21 ?7858 08/20/21 ?1422 08/21/21 ?8502 08/22/21 ?0256  ?HGB  --    < > 7.6*   < > 8.5* 8.2* 8.7*  ?HCT  --    < > 24.9*   < > 27.6* 26.2* 27.8*  ?PLT  --   --  245  --   --  247 230  ?LABPROT  --    < > 18.5*  --   --  20.8* 19.4*  ?INR  --    < > 1.6*  --   --  1.8* 1.7*  ?CREATININE  --   --  1.74*  --   --  1.79*  --   ?TROPONINIHS 40*  --   --   --   --   --   --   ? < > = values in this interval not displayed.  ? ? ? ?Estimated Creatinine Clearance: 47.3 mL/min (A) (by C-G formula based on SCr of 1.79 mg/dL (H)). ? ? ?Medical History: ?Past Medical History:  ?Diagnosis Date  ? Acute combined systolic and diastolic congestive heart failure ([place])   ? Acute on chronic heart failure with preserved ejection fraction (HFpEF) ([place])   ? Acute respiratory failure with hypoxia ([place])   ? Acute respiratory failure with hypoxia ([place])   ? Aortic stenosis   ? moderate in 2022  ? Atrial fibrillation ([place])   ? CHF (congestive heart failure) ([place])   ? Chronic pleural effusion, Left 11/16/2020  ? COPD exacerbation ([place]) 12/09/2020  ? Coronary artery disease   ? Demand ischemia ([place]) 03/26/2021  ? Diabetes mellitus without complication ([place])   ? HLD (hyperlipidemia)   ? Hypertension   ? Long term (current) use of anticoagulants 12/29/2019  ? Malnutrition of moderate degree 05/09/2021  ? Peripheral arterial disease ([place])   ? Pneumonia due to COVID-19 virus 05/29/2021  ? ? ?Assessment: ?71 yo M presenting with dyspnea 2/2 COPD exacerbation.  PMH significant for afib on PTA warfarin 2.'5mg'$  daily. Pharmacy consulted for warfarin dosing - now on hold. R/LHC planned for [facility] - pharmacy consulted to transition to IV heparin. ? ?INR subtherapeutic at 1.7 and appropriate to begin IV heparin. ?Hgb 9.5 > 7.5 > 1 unit RBC > 8s and plt remain normal. No overt s/s of bleeding noted.  ?Previously therapeutic on IV heparin at 1650 units/hr in 06/2021. ? ?Goal of Therapy:  ?Heparin level 0.3-0.7 units/ml ?INR 2-2.5 (decreased due to normocytic anemia in past) ?Monitor platelets by anticoagulation protocol: Yes ?  ?Plan:  ?Begin IV heparin at 1500 units/hr with no bolus ?8 hr heparin level ?Daily heparin level and CBC ?Monitor for s/s of bleeding ? ?Thank you for involving pharmacy in this patient's care. ? ?Renold Genta, PharmD, BCPS ?Clinical Pharmacist ?Clinical phone for 08/22/2021 until 3p is [phone] ?08/22/2021 8:51 AM ? ?**Pharmacist phone directory can be found on [email or web address] listed under [place]** ? ?

## 2021-08-22 NOTE — Care Management Important Message (Signed)
Important Message ? ?Patient Details  ?Name: Rodney [name] ?MRN: [id number] ?Date of Birth: 1950/04/29 ? ? ?Medicare Important Message Given:  Yes ? ? ? ? ?Shelda Altes ?08/22/2021, 7:31 AM ?

## 2021-08-22 NOTE — Progress Notes (Addendum)
Family Medicine Teaching Service ?Daily Progress Note ?Intern Pager: [phone] ? ?Patient name: Rodney [name] Medical record number: [id number] ?Date of birth: June 14, 1950 Age: 71 y.o. Gender: male ? ?Primary Care Provider: [name], MD ?Consultants: Cardiology, vascular surgery ?  Code Status: Full Code ? ?Pt Overview and Major Events to Date:  ?4/25-admitted, placed on BiPAP briefly then weaned to nasal cannula, started cefepime and given IV methylprednisolone ?4/26-transitioned steroids to oral prednisone, transfused 1u PRBC ?4/27-cefepime transitioned to cefdinir ?4/29-completed course of cefdinir and prednisone ? ?Assessment and Plan: ?Rodney [name] is a 71 y.o. male who presented with acute on chronic hypoxemic respiratory failure secondary to presumed COPD exacerbation, now undergoing evaluation for possible TAVR. PMHx significant for: COPD, HFrEF, severe AS, CAD, PAD s/p left BKA, PAF on warfarin, T2DM, CKD stage III, anemia of chronic disease, protein calorie malnutrition. ? ?Acute on chronic hypoxemic respiratory failure secondary to COPD exacerbation ?Improving.  Currently on 4 L, close to home oxygen requirement of 3 L.  Completes steroids and antibiotics today. ?- cefdinir day 5/5 ?- prednisone day 5/5 ?- continue LAMA + LABA/ICS ?- space out Duonebs TID scheduled + q2h prn ?- wean O2, goal SpO2 88-92% ? ?Severe AS ?HFpEF ?Mildly hypervolemic on exam. ?Ongoing work-up for potential TAVR.  Plan for cardiac catheterization to assess coronary arteries on 5/1.  Also plan for toe amputation early next week to decrease infection risk.  May also need dental extractions. ?- ongoing TAVR workup per cardiology and vascular surgery ?- will likely need dental consult for extractions ?- continue home furosemide 40 mg daily ? ?Bilateral pleural effusions ?- monitor ? ?Anemia of CKD ?Hemoglobin stable. 8.7 ?- hold home Fe supplement ?- consider IV Fe prior to discharge ?- transfusion threshold Hgb  8 ? ?CKD III ?Cr stable 1.39 which appears to be around his baseline. ?- monitor ? ?T2DM ?Well-controlled. ?- mSSI ?- Levemir 15 units daily ?- monitor CBG ? ?pAF ?In A Fib, rate controlled. ?- continue carvedilol ?- warfarin switched to heparin given upcoming procedures ? 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 ?Mediastinal lymph nodes ?Needs outpatient PET follow-up. ? ?GERD ?- PPI ? ?Insomnia ?- trazodone qhs prn ? ?FEN/GI: Carb modified ?PPx: Heparin gtt ? ?Disposition: Likely home with services after TAVR work-up is complete ? ?Subjective:  ?No acute events overnight. Asleep but easily arousable. No concerns expressed this morning. ? ?Objective: ?Temp:  [97.4 ?F (36.3 ?C)-98.1 ?F (36.7 ?C)] 98.1 ?F (36.7 ?C) (04/29 0500) ?Pulse Rate:  [72-89] 73 (04/29 0500) ?Resp:  [14-26] 19 (04/29 0500) ?BP: (128-151)/(72-91) 145/83 (04/29 0500) ?SpO2:  [98 %-100 %] 100 % (04/29 0500) ?Weight:  [96.8 kg] 96.8 kg (04/29 0500) ?Physical Exam: ?General: Resting comfortably in bed, awakens easily, NAD ?Cardiovascular: Irregularly irregular, 3/6 harsh systolic murmur ?Respiratory: Bibasilar rales, breathing comfortably on [place] ?Abdomen: soft, non-tender ?Extremities: 1+ pitting edema, s/p left BKA ? ?Laboratory: ?Recent Labs  ?Lab 08/21/21 ?3710 08/22/21 ?0256 08/23/21 ?6269  ?WBC 7.7 8.2 8.2  ?HGB 8.2* 8.7* 8.7*  ?HCT 26.2* 27.8* 26.8*  ?PLT 247 230 215  ? ?Recent Labs  ?Lab 08/21/21 ?4854 08/22/21 ?0256 08/23/21 ?6270  ?NA 136 134* 135  ?K 4.5 4.4 4.2  ?CL 97* 97* 95*  ?CO2 33* 33* 32  ?BUN 37* 35* 32*  ?CREATININE 1.79* 1.43* 1.39*  ?CALCIUM 8.5* 8.7* 8.8*  ?GLUCOSE 168* 186* 167*  ? ? ? ? ?  Imaging/Diagnostic Tests: ?CT CORONARY MORPH W/CTA COR W/SCORE W/CA W/CM &/OR WO/CM ? ?Result Date: 08/22/2021 ?EXAM: OVER-READ INTERPRETATION  CT CHEST The following report is an over-read performed by radiologist Dr. Yetta Glassman of [facility],  [place] on 08/22/2021. This over-read does not include interpretation of cardiac or coronary anatomy or pathology. The coronary calcium score/coronary CTA interpretation by the cardiologist is attached. COMPARISON:  None. FINDINGS: Extracardiac findings will be described separately under dictation for contemporaneously obtained CTA chest, abdomen and pelvis. IMPRESSION: Please see separate dictation for contemporaneously obtained CTA chest, abdomen and pelvis dated August 22, 2021 for full description of relevant extracardiac findings. Electronically Signed   By: Yetta Glassman M.D.   On: 08/22/2021 14:15  ? ?CT ANGIO CHEST AORTA W/CM & OR WO/CM ? ?Result Date: 08/22/2021 ?CLINICAL DATA:  Preop evaluation for TAVR EXAM: CT ANGIOGRAPHY CHEST, ABDOMEN AND PELVIS TECHNIQUE: Non-contrast CT of the chest was initially obtained. Multidetector CT imaging through the chest, abdomen and pelvis was performed using the standard protocol during bolus administration of intravenous contrast. Multiplanar reconstructed images and MIPs were obtained and reviewed to evaluate the vascular anatomy. RADIATION DOSE REDUCTION: This exam was performed according to the departmental dose-optimization program which includes automated exposure control, adjustment of the mA and/or kV according to patient size and/or use of iterative reconstruction technique. CONTRAST:  156m OMNIPAQUE IOHEXOL 350 MG/ML SOLN COMPARISON:  Chest CT dated August 19, 2021 FINDINGS: CTA CHEST FINDINGS Cardiovascular: Cardiomegaly. Mitral annular calcifications. Left main and three-vessel coronary artery calcifications. Moderate atherosclerotic disease of the thoracic aorta. No suspicious filling defects of the central pulmonary arteries. Mediastinum/Nodes: Small hiatal hernia. Thyroid is unremarkable. Enlarged mediastinal lymph nodes, unchanged with recent prior exam. Reference right lower paratracheal lymph node measuring 2.0 cm in short axis located on series 11, image  107. Lungs/Pleura: Central airways are patent. Right pleural calcifications. Bilateral bronchial wall thickening, interlobular septal thickening, and mild diffuse ground-glass opacities. Paraseptal emphysema. Moderate right pleural effusion and small loculated left pleural effusion with associated atelectasis. Musculoskeletal: No chest wall abnormality. No acute or significant osseous findings. CTA ABDOMEN AND PELVIS FINDINGS Hepatobiliary: No suspicious liver lesions. No gallbladder wall thickening. No biliary ductal dilation. Pancreas: Unremarkable. No pancreatic ductal dilatation or surrounding inflammatory changes. Spleen: Normal in size without focal abnormality. Adrenals/Urinary Tract: Bilateral adrenal glands are unremarkable. No hydronephrosis or nephrolithiasis. Bilateral areas of cortical irregularity, likely due to scarring. Bladder is unremarkable. Stomach/Bowel: Stomach is within normal limits. Appendix appears normal. No evidence of bowel wall thickening, distention, or inflammatory changes. Vascular/lymphatic: Normal caliber thoracic aorta with moderate to severe calcified and noncalcified plaque. Aortic branch vessels are patent, severe narrowing of the proximal celiac artery, likely due to a combination of diaphragmatic compression and calcified plaque and moderate narrowing of the SMA due to calcified plaque. Near complete occlusion at the bifurcation of the left profunda femoris and SFA. Stent of the proximal right SFA. No pathologically enlarged lymph nodes seen in the abdomen or pelvis. Rim enhancing inguinal fluid collection measuring 2.7 x 2.0 cm on series 12, image 214. Reproductive: Prostatomegaly. Other: Small fat containing inguinal hernias. Soft tissue nodules seen in the subcutaneous soft tissues of the lower abdomen, likely injection granulomas. Musculoskeletal: Intramedullary rod seen in the proximal right femur. No aggressive appearing osseous lesions. VASCULAR MEASUREMENTS PERTINENT  TO TAVR: AORTA: Minimal Aortic Diameter-14 mm Severity of Aortic Calcification-severe RIGHT PELVIS: Right Common Iliac Artery - Minimal Diameter-4.3 mm Tortuosity-none Calcification-severe Right External Iliac

## 2021-08-22 NOTE — Progress Notes (Signed)
OT Cancellation Note ? ?Patient Details ?Name: Rodney [name] ?MRN: [id number] ?DOB: [birth year] ? ? ?Cancelled Treatment:    Reason Eval/Treat Not Completed: Patient declined, no reason specified Patient just getting back from CT at time of OT arrival, politely requesting for OT to come back later. OT will follow back as time permits.  ? ?Corinne Ports E. Othman Masur, OTR/L ?Acute Rehabilitation Services ?[phone] ?[phone]  ? ?Corinne Ports Jazilyn Siegenthaler ?08/22/2021, 2:38 PM ?

## 2021-08-23 DIAGNOSIS — J9621 Acute and chronic respiratory failure with hypoxia: Secondary | ICD-10-CM | POA: Diagnosis not present

## 2021-08-23 DIAGNOSIS — N179 Acute kidney failure, unspecified: Secondary | ICD-10-CM | POA: Diagnosis not present

## 2021-08-23 DIAGNOSIS — I480 Paroxysmal atrial fibrillation: Secondary | ICD-10-CM | POA: Diagnosis not present

## 2021-08-23 DIAGNOSIS — J441 Chronic obstructive pulmonary disease with (acute) exacerbation: Secondary | ICD-10-CM | POA: Diagnosis not present

## 2021-08-23 LAB — CBC
HCT: 26.8 % — ABNORMAL LOW (ref 39.0–52.0)
Hemoglobin: 8.7 g/dL — ABNORMAL LOW (ref 13.0–17.0)
MCH: 29.6 pg (ref 26.0–34.0)
MCHC: 32.5 g/dL (ref 30.0–36.0)
MCV: 91.2 fL (ref 80.0–100.0)
Platelets: 215 10*3/uL (ref 150–400)
RBC: 2.94 MIL/uL — ABNORMAL LOW (ref 4.22–5.81)
RDW: 15.9 % — ABNORMAL HIGH (ref 11.5–15.5)
WBC: 8.2 10*3/uL (ref 4.0–10.5)
nRBC: 0 % (ref 0.0–0.2)

## 2021-08-23 LAB — GLUCOSE, CAPILLARY
Glucose-Capillary: 159 mg/dL — ABNORMAL HIGH (ref 70–99)
Glucose-Capillary: 163 mg/dL — ABNORMAL HIGH (ref 70–99)
Glucose-Capillary: 135 mg/dL — ABNORMAL HIGH (ref 70–99)
Glucose-Capillary: 191 mg/dL — ABNORMAL HIGH (ref 70–99)

## 2021-08-23 LAB — BASIC METABOLIC PANEL
Anion gap: 8 (ref 5–15)
BUN: 32 mg/dL — ABNORMAL HIGH (ref 8–23)
CO2: 32 mmol/L (ref 22–32)
Calcium: 8.8 mg/dL — ABNORMAL LOW (ref 8.9–10.3)
Chloride: 95 mmol/L — ABNORMAL LOW (ref 98–111)
Creatinine, Ser: 1.39 mg/dL — ABNORMAL HIGH (ref 0.61–1.24)
GFR, Estimated: 55 mL/min — ABNORMAL LOW (ref >60)
Glucose, Bld: 167 mg/dL — ABNORMAL HIGH (ref 70–99)
Potassium: 4.2 mmol/L (ref 3.5–5.1)
Sodium: 135 mmol/L (ref 135–145)

## 2021-08-23 LAB — PROTIME-INR
INR: 1.6 — ABNORMAL HIGH (ref 0.8–1.2)
Prothrombin Time: 18.9 seconds — ABNORMAL HIGH (ref 11.4–15.2)

## 2021-08-23 LAB — HEPARIN LEVEL (UNFRACTIONATED): Heparin Unfractionated: 0.43 IU/mL (ref 0.30–0.70)

## 2021-08-23 MED ORDER — FIDAXOMICIN 200 MG PO TABS
200.0000 mg | ORAL_TABLET | Freq: Two times a day (BID) | ORAL | Status: AC
  Administered 2021-08-23 – 2021-09-01 (×20): 200 mg via ORAL
  Filled 2021-08-23 (×20): qty 1

## 2021-08-23 NOTE — Progress Notes (Signed)
FPTS Brief Progress Note ? ?S: Patient asleep but television on ? ? ?O: ?BP (!) 135/91 (BP Location: Right Arm)   Pulse 73   Temp 97.7 ?F (36.5 ?C)   Resp (!) 22   Ht 6' (1.829 m)   Wt 98.1 kg   SpO2 99%   BMI 29.33 kg/m?   ? ? ?A/P: ?He has upcoming procedures planned the next several days as part of his TAVR evaluation ?- Orders reviewed. Labs for AM ordered, which was adjusted as needed.  ?- If condition changes, plan includes page primary team.  ? ?Zola Button, MD ?08/23/2021, 1:35 AM ?PGY-2, [place] [facility] Night Resident  ?Please page [phone] with questions.  ?  ?

## 2021-08-23 NOTE — Progress Notes (Signed)
ANTICOAGULATION CONSULT NOTE - Follow-Up Consult ? ?Pharmacy Consult for warfarin to IV heparin  ?Indication: atrial fibrillation ? ?No Known Allergies ? ?Patient Measurements: ?Height: 6' (182.9 cm) ?Weight: 96.8 kg (213 lb 6.5 oz) ?IBW/kg (Calculated) : 77.6 ?Heparin dosing weight: 96 kg ? ?Vital Signs: ?Temp: 98.7 ?F (37.1 ?C) (04/29 0981) ?Temp Source: Oral (04/29 1914) ?BP: 125/75 (04/29 0847) ?Pulse Rate: 76 (04/29 0847) ? ?Labs: ?Recent Labs  ?  08/21/21 ?0344 08/22/21 ?0256 08/22/21 ?2311 08/23/21 ?0352  ?HGB 8.2* 8.7*  --  8.7*  ?HCT 26.2* 27.8*  --  26.8*  ?PLT 247 230  --  215  ?LABPROT 20.8* 19.4*  --  18.9*  ?INR 1.8* 1.7*  --  1.6*  ?HEPARINUNFRC  --   --  0.43 0.43  ?CREATININE 1.79* 1.43*  --  1.39*  ? ? ? ?Estimated Creatinine Clearance: 60.5 mL/min (A) (by C-G formula based on SCr of 1.39 mg/dL (H)). ? ? ?Medical History: ?Past Medical History:  ?Diagnosis Date  ? Acute combined systolic and diastolic congestive heart failure ([place])   ? Acute on chronic heart failure with preserved ejection fraction (HFpEF) ([place])   ? Acute respiratory failure with hypoxia ([place])   ? Acute respiratory failure with hypoxia ([place])   ? Aortic stenosis   ? moderate in 2022  ? Atrial fibrillation ([place])   ? CHF (congestive heart failure) ([place])   ? Chronic pleural effusion, Left 11/16/2020  ? COPD exacerbation ([name]) 12/09/2020  ? Coronary artery disease   ? Demand ischemia ([place]) 03/26/2021  ? Diabetes mellitus without complication ([place])   ? HLD (hyperlipidemia)   ? Hypertension   ? Long term (current) use of anticoagulants 12/29/2019  ? Malnutrition of moderate degree 05/09/2021  ? Peripheral arterial disease ([place])   ? Pneumonia due to COVID-19 virus 05/29/2021  ? ? ?Assessment: ?71 yo M presenting with dyspnea 2/2 COPD exacerbation. PMH significant for afib on PTA warfarin 2.'5mg'$  daily. Pharmacy consulted for warfarin dosing - now on hold. R/LHC planned for [facility] - pharmacy consulted to transition to IV heparin. ? ?INR 1.6  ?Heparin  level: 0.43, therapeutic ?Current heparin infusion rate: 1500 units/hr ?Hgb/Hct/Plt stable ?No reported s/sx of bleeding ? ? ?Goal of Therapy:  ?Heparin level 0.3-0.7 units/ml ?INR 2-2.5 (decreased due to normocytic anemia in past) ?Monitor platelets by anticoagulation protocol: Yes ?  ?Plan:  ?Continue heparin infusion at 1500 units/hr  ?Continue to hold warfarin ?Daily heparin level and CBC ?Monitor for s/s of bleeding ?Plan for [facility] on Monday, 08/25/21  ? ?Luisa Hart, PharmD, BCPS ?Clinical Pharmacist ?08/23/2021 11:55 AM  ? ?Please refer to [facility] for pharmacy phone number  ?

## 2021-08-23 NOTE — Progress Notes (Signed)
Received page from nursing that the patient has had at least 4 large watery bowel movements. Patient with recent history of C. Diff 2 months prior and has been receiving antibiotics. Discussed with Dr. Baxter Flattery, and we will go ahead and proceed with treatment (with fidaxomicin) at this time and enteric precautions.  ? ? ?Rodney Dault, DO  ?

## 2021-08-23 NOTE — Plan of Care (Signed)
?  Problem: Education: ?Goal: Knowledge of disease or condition will improve ?Outcome: Progressing ?Goal: Knowledge of the prescribed therapeutic regimen will improve ?Outcome: Progressing ?  ?Problem: Activity: ?Goal: Will verbalize the importance of balancing activity with adequate rest periods ?Outcome: Progressing ?  ?Problem: Respiratory: ?Goal: Ability to maintain a clear airway will improve ?Outcome: Progressing ?  ?

## 2021-08-23 NOTE — Progress Notes (Signed)
ANTICOAGULATION CONSULT NOTE - Follow-Up Consult ? ?Pharmacy Consult for warfarin to IV heparin  ?Indication: atrial fibrillation ? ?No Known Allergies ? ?Patient Measurements: ?Height: 6' (182.9 cm) ?Weight: 98.1 kg (216 lb 4.3 oz) ?IBW/kg (Calculated) : 77.6 ?Heparin dosing weight: 96 kg ? ?Vital Signs: ?Temp: 97.7 ?F (36.5 ?C) (04/28 2255) ?Temp Source: Oral (04/28 2111) ?BP: 135/91 (04/28 2255) ?Pulse Rate: 73 (04/28 2255) ? ?Labs: ?Recent Labs  ?  08/20/21 ?0235 08/20/21 ?0639 08/20/21 ?1422 08/21/21 ?0344 08/22/21 ?0256 08/22/21 ?2311  ?HGB 7.6*   < > 8.5* 8.2* 8.7*  --   ?HCT 24.9*   < > 27.6* 26.2* 27.8*  --   ?PLT 245  --   --  247 230  --   ?LABPROT 18.5*  --   --  20.8* 19.4*  --   ?INR 1.6*  --   --  1.8* 1.7*  --   ?HEPARINUNFRC  --   --   --   --   --  0.43  ?CREATININE 1.74*  --   --  1.79* 1.43*  --   ? < > = values in this interval not displayed.  ? ? ? ?Estimated Creatinine Clearance: 59.2 mL/min (A) (by C-G formula based on SCr of 1.43 mg/dL (H)). ? ? ?Medical History: ?Past Medical History:  ?Diagnosis Date  ? Acute combined systolic and diastolic congestive heart failure ([employer])   ? Acute on chronic heart failure with preserved ejection fraction (HFpEF) ([place])   ? Acute respiratory failure with hypoxia ([place])   ? Acute respiratory failure with hypoxia ([place])   ? Aortic stenosis   ? moderate in 2022  ? Atrial fibrillation ([place])   ? CHF (congestive heart failure) ([place])   ? Chronic pleural effusion, Left 11/16/2020  ? COPD exacerbation ([place]) 12/09/2020  ? Coronary artery disease   ? Demand ischemia ([place]) 03/26/2021  ? Diabetes mellitus without complication ([place])   ? HLD (hyperlipidemia)   ? Hypertension   ? Long term (current) use of anticoagulants 12/29/2019  ? Malnutrition of moderate degree 05/09/2021  ? Peripheral arterial disease ([place])   ? Pneumonia due to COVID-19 virus 05/29/2021  ? ? ?Assessment: ?71 yo M presenting with dyspnea 2/2 COPD exacerbation. PMH significant for afib on PTA warfarin 2.'5mg'$   daily. Pharmacy consulted for warfarin dosing - now on hold. R/LHC planned for [facility] - pharmacy consulted to transition to IV heparin. ? ?Heparin level therapeutic: 0.43 on 1500 units/hr, Hgb low 8.7, patient s/p 1 u PRBC, no reported s/sx of overt bleeding ? ? ?Goal of Therapy:  ?Heparin level 0.3-0.7 units/ml ?INR 2-2.5 (decreased due to normocytic anemia in past) ?Monitor platelets by anticoagulation protocol: Yes ?  ?Plan:  ?Continue IV heparin at 1500 units/hr  ?8 hr heparin level ?Daily heparin level and CBC ?Monitor for s/s of bleeding ? ?Georga Bora, PharmD ?Clinical Pharmacist ?08/23/2021 1:12 AM ?Please check AMION for all [place] numbers ? ? ?

## 2021-08-23 NOTE — Progress Notes (Signed)
? ? ?  Assessment & Plan  ?  ?1.Severe aortic stenosis ?- 05/2021 echo: LVEF 55-60%, no WMAs, severe AS AVA VTI 0.75 mean grad 43 DI 0.22 ?- ongoing workup by Dr Burt Knack for possible TAVR, plan for RHC/LHC Monday ? ?2. COPD exacerbation ?- per primary team ? ?3. PAD ?07/2020 seen by [facility] for critical right lower extremity ischemia >>underwent superficial fem-pop bypass with readmission for postoperative DVT and was placed on Coumadin. By 10/2020 with impending bypass graft failure>>VVS unable to vascularize and he was instead treated with stent of right SFA femoropopliteal bypass and was also treated for osteomyelitis and is now s/p right great toe amputation and ultimate L BKA. Currently with a necrotic appearing lesion on his second toe. Most recent abdominal aortogram and peripheral angiogram reviewed by Dr. Burt Knack that suggested borderline vascular access if TAVR pursued. Seen by [facility]VS and underwent LE duplex  4/27 with patent distal CFA artery and SFA to below the knee BPG graft stent. May require amputation of the right great toe if TAVR pursued. Will follow VVS recommendations. Will likely be alternative access if TAVR pursued.  ? ?4. Persistsent afib/aflutter ?- Unable to afford Eliquis and was subsequently placed on Coumadin. Remains in persistent AF with rate control. Continue carvedilol, coumadin per pharmacy. ? ?5. CKD IIIb ? ? ?Ongoing TAVR workup over the weekend, cath Monday. No additional cardiology recs over the weekend.  ? ? ?Carlyle Dolly MD ? ? ? ? ?For questions or updates, please contact [place] ?Please consult [email or web address] for contact info under  ? ?  ?   ?Signed, ?Carlyle Dolly, MD  ?08/23/2021, 9:10 AM   ? ?

## 2021-08-24 DIAGNOSIS — N179 Acute kidney failure, unspecified: Secondary | ICD-10-CM | POA: Diagnosis not present

## 2021-08-24 DIAGNOSIS — J441 Chronic obstructive pulmonary disease with (acute) exacerbation: Secondary | ICD-10-CM | POA: Diagnosis not present

## 2021-08-24 DIAGNOSIS — J9621 Acute and chronic respiratory failure with hypoxia: Secondary | ICD-10-CM | POA: Diagnosis not present

## 2021-08-24 DIAGNOSIS — I480 Paroxysmal atrial fibrillation: Secondary | ICD-10-CM | POA: Diagnosis not present

## 2021-08-24 LAB — BASIC METABOLIC PANEL
Anion gap: 7 (ref 5–15)
BUN: 33 mg/dL — ABNORMAL HIGH (ref 8–23)
CO2: 34 mmol/L — ABNORMAL HIGH (ref 22–32)
Calcium: 8.5 mg/dL — ABNORMAL LOW (ref 8.9–10.3)
Chloride: 92 mmol/L — ABNORMAL LOW (ref 98–111)
Creatinine, Ser: 1.33 mg/dL — ABNORMAL HIGH (ref 0.61–1.24)
GFR, Estimated: 58 mL/min — ABNORMAL LOW (ref >60)
Glucose, Bld: 213 mg/dL — ABNORMAL HIGH (ref 70–99)
Potassium: 3.9 mmol/L (ref 3.5–5.1)
Sodium: 133 mmol/L — ABNORMAL LOW (ref 135–145)

## 2021-08-24 LAB — CBC
HCT: 26 % — ABNORMAL LOW (ref 39.0–52.0)
Hemoglobin: 8.4 g/dL — ABNORMAL LOW (ref 13.0–17.0)
MCH: 29.3 pg (ref 26.0–34.0)
MCHC: 32.3 g/dL (ref 30.0–36.0)
MCV: 90.6 fL (ref 80.0–100.0)
Platelets: 186 10*3/uL (ref 150–400)
RBC: 2.87 MIL/uL — ABNORMAL LOW (ref 4.22–5.81)
RDW: 15.6 % — ABNORMAL HIGH (ref 11.5–15.5)
WBC: 7 10*3/uL (ref 4.0–10.5)
nRBC: 0 % (ref 0.0–0.2)

## 2021-08-24 LAB — GLUCOSE, CAPILLARY
Glucose-Capillary: 215 mg/dL — ABNORMAL HIGH (ref 70–99)
Glucose-Capillary: 181 mg/dL — ABNORMAL HIGH (ref 70–99)
Glucose-Capillary: 152 mg/dL — ABNORMAL HIGH (ref 70–99)
Glucose-Capillary: 139 mg/dL — ABNORMAL HIGH (ref 70–99)
Glucose-Capillary: 151 mg/dL — ABNORMAL HIGH (ref 70–99)

## 2021-08-24 LAB — PROTIME-INR
INR: 1.2 (ref 0.8–1.2)
Prothrombin Time: 15.2 seconds (ref 11.4–15.2)

## 2021-08-24 LAB — HEPARIN LEVEL (UNFRACTIONATED): Heparin Unfractionated: 0.56 IU/mL (ref 0.30–0.70)

## 2021-08-24 MED ORDER — IPRATROPIUM-ALBUTEROL 0.5-2.5 (3) MG/3ML IN SOLN
3.0000 mL | Freq: Two times a day (BID) | RESPIRATORY_TRACT | Status: DC
  Administered 2021-08-24 – 2021-08-25 (×3): 3 mL via RESPIRATORY_TRACT
  Filled 2021-08-24 (×3): qty 3

## 2021-08-24 NOTE — Progress Notes (Signed)
FPTS Brief Progress Note ? ?S: Lying in bed sleeping.  ? ? ?O: ?BP 138/71 (BP Location: Right Arm)   Pulse 75   Temp 98 ?F (36.7 ?C) (Oral)   Resp 18   Ht 6' (1.829 m)   Wt 96.8 kg   SpO2 100%   BMI 28.94 kg/m?   ?General: Appears well, no acute distress. Age appropriate. ?Respiratory: normal effort ? ?A/P: ?Acute on chronic hypoxemic respiratory failure secondary to COPD exacerbation ?- Orders reviewed. Labs for AM ordered, which was adjusted as needed.  ? ? ?Gerlene Fee, DO ?08/24/2021, 1:41 AM ?PGY-3, [facility] Night Resident  ?Please page [phone] with questions.  ? ? ?

## 2021-08-24 NOTE — Progress Notes (Signed)
Family Medicine Teaching Service ?Daily Progress Note ?Intern Pager: [phone] ? ?Patient name: Rodney [name] Medical record number: [id number] ?Date of birth: 1950-10-30 Age: 71 y.o. Gender: male ? ?Primary Care Provider: [name], MD ?Consultants: Cardiology, vascular surgery ?Code Status: Full code ? ?Pt Overview and Major Events to Date:  ?4/25-admitted, placed on BiPAP briefly then weaned to nasal cannula, started cefepime and given IV methylprednisolone ?4/26-transitioned steroids to oral prednisone, transfused 1u PRBC ?4/27-cefepime transitioned to cefdinir ?4/29-completed course of cefdinir and prednisone ?  ?Assessment and Plan: ?[name] is a 71-year initially admitted for COPD exacerbation (now resolved).  Now experiencing C. difficile recurrence and undergoing preop preparation for TAVR, which includes L+RHC and amputation of gangrenous toe. PMHx significant for: COPD, HFrEF, severe AS, CAD, PAD s/p left BKA, PAF on warfarin, T2DM, CKD stage III, anemia of chronic disease, protein calorie malnutrition. ?  ?C. difficile recurrence ?Four episodes of watery diarrhea yesterday.  Given history of recent hospitalizations this past year and known prior C. difficile, initiated treatment with fidaxomicin.  Appears euvolemic and electrolytes stable today.  ? ?Severe AS  HFpEF ?Euvolemic on exam today.  Continue home Lasix 40 mg p.o. daily.  TAVR preop preparation includes L+ R HC Monday 5/1, toe amputation, may also need dental extractions. ? 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 ?  ?Acute on chronic hypoxemic respiratory failure secondary to COPD exacerbation; resolved ?Now on baseline O2 requirement of 3 L [place].  Completed prednisone and cefdinir 5-day course yesterday.  Duo nebs now twice daily.  O2 goal 80-92%.  Continue Breo Ellipta daily. ? ?Bilateral pleural effusions ?Continue to  monitor ?  ?Anemia of CKD ?Hemoglobin stable today.  Transfusion/hold is 8.  We will consider IV iron transfusion prior to discharge. ?  ?CKD III ?Stable, now at baseline. ?  ?T2DM ?Well-controlled on Levemir 15 units daily and moderate sliding scale insulin. ?  ?pAF ?Rate controlled.  Continue carvedilol 6.25.  Warfarin switched to heparin for point procedures. ?   ?Mediastinal lymph nodes ?Needs outpatient PET follow-up. ?  ?GERD ?Protonix 40 mg ?  ?Insomnia ?Trazodone nightly as needed ? ?FEN/GI: Carb modified diet, will be n.p.o. at midnight tonight in preparation for heart cath tomorrow ?PPx: Protonix ?Dispo:Pending PT recommendations  pending clinical improvement . Barriers include planned to amputation and left plus right heart cath.  ? ?Subjective:  ?Patient resting comfortably in bed in left lateral decubitus position with blinds closed and TV on.  Reports no complaints no pain.  Breathing well on 3 L nasal cannula.  No abdominal pain.  He reports his last diarrhea was this past Wednesday; he persists with this statement despite clarification as today is Sunday. ? ?Objective: ?Temp:  [98 ?F (36.7 ?C)-98.6 ?F (37 ?C)] 98.3 ?F (36.8 ?C) (04/30 1120) ?Pulse Rate:  [74-80] 74 (04/30 1120) ?Resp:  [14-20] 20 (04/30 1120) ?BP: (132-145)/(71-84) 145/83 (04/30 1120) ?SpO2:  [93 %-100 %] 99 % (04/30 1120) ?Weight:  [96.8 kg] 96.8 kg (04/30 0107) ?Physical Exam: ?General: Awake, alert, no acute distress ?Cardiovascular: Irregularly irregular ?Respiratory: CTA B ?Abdomen: Soft, nondistended ? ?Laboratory: ?Recent Labs  ?Lab 08/22/21 ?0256 08/23/21 ?1638 08/24/21 ?0229  ?WBC 8.2 8.2 7.0  ?HGB 8.7* 8.7* 8.4*  ?HCT 27.8* 26.8* 26.0*  ?PLT 230 215 186  ? ?Recent Labs  ?Lab 08/22/21 ?0256 08/23/21 ?4665 08/24/21 ?0229  ?NA 134* 135  133*  ?K 4.4 4.2 3.9  ?CL 97* 95* 92*  ?CO2 33* 32 34*  ?BUN 35* 32* 33*  ?CREATININE 1.43* 1.39* 1.33*  ?CALCIUM 8.7* 8.8* 8.5*  ?GLUCOSE 186* 167* 213*  ? ?Imaging/Diagnostic Tests: ?None new  last 24 hours ? ?Ezequiel Essex, MD ?08/24/2021, 12:32 PM ?PGY-2, [place] ?[place] Intern pager: [phone], text pages welcome ? ?

## 2021-08-24 NOTE — Progress Notes (Signed)
FPTS Brief Progress Note ? ?S:Went to see patient at bedside, patient sleeping comfortably.  ? ? ?O: ?BP 116/63 (BP Location: Right Arm)   Pulse 76   Temp 97.6 ?F (36.4 ?C) (Oral)   Resp 20   Ht 6' (1.829 m)   Wt 96.8 kg   SpO2 96%   BMI 28.94 kg/m?   ? ? ?A/P: ?Plans per day team ?-NPO @ MN ?-AM Heart cath and toe amputation ?- Orders reviewed. Labs for AM ordered, which was adjusted as needed.  ? ? ?Holley Bouche, MD ?08/24/2021, 11:52 PM ?PGY-1, [facility] Night Resident  ?Please page [phone] with questions.  ? ? ?

## 2021-08-24 NOTE — Progress Notes (Signed)
ANTICOAGULATION CONSULT NOTE - Follow-Up Consult ? ?Pharmacy Consult for warfarin to IV heparin  ?Indication: atrial fibrillation ? ?No Known Allergies ? ?Patient Measurements: ?Height: 6' (182.9 cm) ?Weight: 96.8 kg (213 lb 6.5 oz) ?IBW/kg (Calculated) : 77.6 ?Heparin dosing weight: 96 kg ? ?Vital Signs: ?Temp: 98.2 ?F (36.8 ?C) (04/30 0745) ?Temp Source: Oral (04/30 0107) ?BP: 138/84 (04/30 0745) ?Pulse Rate: 80 (04/30 0745) ? ?Labs: ?Recent Labs  ?  08/22/21 ?0256 08/22/21 ?2311 08/23/21 ?0352 08/24/21 ?0229  ?HGB 8.7*  --  8.7* 8.4*  ?HCT 27.8*  --  26.8* 26.0*  ?PLT 230  --  215 186  ?LABPROT 19.4*  --  18.9* 15.2  ?INR 1.7*  --  1.6* 1.2  ?HEPARINUNFRC  --  0.43 0.43 0.56  ?CREATININE 1.43*  --  1.39* 1.33*  ? ? ? ?Estimated Creatinine Clearance: 63.2 mL/min (A) (by C-G formula based on SCr of 1.33 mg/dL (H)). ? ? ?Medical History: ?Past Medical History:  ?Diagnosis Date  ? Acute combined systolic and diastolic congestive heart failure ([place])   ? Acute on chronic heart failure with preserved ejection fraction (HFpEF) ([place])   ? Acute respiratory failure with hypoxia ([employer])   ? Acute respiratory failure with hypoxia ([place])   ? Aortic stenosis   ? moderate in 2022  ? Atrial fibrillation ([place])   ? CHF (congestive heart failure) ([place])   ? Chronic pleural effusion, Left 11/16/2020  ? COPD exacerbation ([place]) 12/09/2020  ? Coronary artery disease   ? Demand ischemia ([place]) 03/26/2021  ? Diabetes mellitus without complication ([place])   ? HLD (hyperlipidemia)   ? Hypertension   ? Long term (current) use of anticoagulants 12/29/2019  ? Malnutrition of moderate degree 05/09/2021  ? Peripheral arterial disease ([place])   ? Pneumonia due to COVID-19 virus 05/29/2021  ? ? ?Assessment: ?71 yo M presenting with dyspnea 2/2 COPD exacerbation. PMH significant for afib on PTA warfarin 2.'5mg'$  daily. Pharmacy consulted for warfarin dosing - now on hold. R/LHC planned for [facility] - pharmacy consulted to transition to IV heparin. ? ?INR 1.2  ?Heparin  level: 0.56, therapeutic ?Current heparin infusion rate: 1500 units/hr ?Hgb/Hct/Plt stable ?No reported s/sx of bleeding ? ?Goal of Therapy:  ?Heparin level 0.3-0.7 units/ml ?INR 2-2.5 (decreased due to normocytic anemia in past) ?Monitor platelets by anticoagulation protocol: Yes ?  ?Plan:  ?Continue heparin infusion at 1500 units/hr  ?Continue to hold warfarin ?Daily heparin level and CBC ?Monitor for s/s of bleeding ?Plan for [facility] on Monday, 08/25/21  ? ?Luisa Hart, PharmD, BCPS ?Clinical Pharmacist ?08/24/2021 9:09 AM  ? ?Please refer to [facility] for pharmacy phone number  ?

## 2021-08-25 ENCOUNTER — Institutional Professional Consult (permissible substitution): Payer: Medicare PPO | Admitting: Cardiovascular Disease

## 2021-08-25 ENCOUNTER — Other Ambulatory Visit (HOSPITAL_COMMUNITY): Payer: Self-pay

## 2021-08-25 ENCOUNTER — Encounter (HOSPITAL_COMMUNITY)
Admission: EM | Disposition: A | Discharge status: Home / Self Care | Payer: Self-pay | Source: Home / Self Care | Attending: Family Medicine

## 2021-08-25 DIAGNOSIS — I350 Nonrheumatic aortic (valve) stenosis: Secondary | ICD-10-CM | POA: Diagnosis not present

## 2021-08-25 DIAGNOSIS — N179 Acute kidney failure, unspecified: Secondary | ICD-10-CM | POA: Diagnosis not present

## 2021-08-25 DIAGNOSIS — J441 Chronic obstructive pulmonary disease with (acute) exacerbation: Secondary | ICD-10-CM | POA: Diagnosis not present

## 2021-08-25 DIAGNOSIS — J9621 Acute and chronic respiratory failure with hypoxia: Secondary | ICD-10-CM | POA: Diagnosis not present

## 2021-08-25 DIAGNOSIS — J9622 Acute and chronic respiratory failure with hypercapnia: Secondary | ICD-10-CM | POA: Diagnosis not present

## 2021-08-25 DIAGNOSIS — I2510 Atherosclerotic heart disease of native coronary artery without angina pectoris: Secondary | ICD-10-CM | POA: Diagnosis not present

## 2021-08-25 HISTORY — PX: RIGHT/LEFT HEART CATH AND CORONARY ANGIOGRAPHY: CATH118266

## 2021-08-25 LAB — POCT I-STAT EG7
Acid-Base Excess: 13 mmol/L — ABNORMAL HIGH (ref 0.0–2.0)
Acid-Base Excess: 13 mmol/L — ABNORMAL HIGH (ref 0.0–2.0)
Bicarbonate: 39.6 mmol/L — ABNORMAL HIGH (ref 20.0–28.0)
Bicarbonate: 40.1 mmol/L — ABNORMAL HIGH (ref 20.0–28.0)
Calcium, Ion: 1.19 mmol/L (ref 1.15–1.40)
Calcium, Ion: 1.22 mmol/L (ref 1.15–1.40)
HCT: 28 % — ABNORMAL LOW (ref 39.0–52.0)
HCT: 29 % — ABNORMAL LOW (ref 39.0–52.0)
Hemoglobin: 9.5 g/dL — ABNORMAL LOW (ref 13.0–17.0)
Hemoglobin: 9.9 g/dL — ABNORMAL LOW (ref 13.0–17.0)
O2 Saturation: 58 %
O2 Saturation: 61 %
Potassium: 3.4 mmol/L — ABNORMAL LOW (ref 3.5–5.1)
Potassium: 3.5 mmol/L (ref 3.5–5.1)
Sodium: 137 mmol/L (ref 135–145)
Sodium: 139 mmol/L (ref 135–145)
TCO2: 42 mmol/L — ABNORMAL HIGH (ref 22–32)
TCO2: 42 mmol/L — ABNORMAL HIGH (ref 22–32)
pCO2, Ven: 64 mmHg — ABNORMAL HIGH (ref 44–60)
pCO2, Ven: 64 mmHg — ABNORMAL HIGH (ref 44–60)
pH, Ven: 7.4 (ref 7.25–7.43)
pH, Ven: 7.405 (ref 7.25–7.43)
pO2, Ven: 32 mmHg (ref 32–45)
pO2, Ven: 33 mmHg (ref 32–45)

## 2021-08-25 LAB — POCT I-STAT 7, (LYTES, BLD GAS, ICA,H+H)
Acid-Base Excess: 12 mmol/L — ABNORMAL HIGH (ref 0.0–2.0)
Bicarbonate: 38.1 mmol/L — ABNORMAL HIGH (ref 20.0–28.0)
Calcium, Ion: 1.21 mmol/L (ref 1.15–1.40)
HCT: 28 % — ABNORMAL LOW (ref 39.0–52.0)
Hemoglobin: 9.5 g/dL — ABNORMAL LOW (ref 13.0–17.0)
O2 Saturation: 92 %
Potassium: 3.5 mmol/L (ref 3.5–5.1)
Sodium: 137 mmol/L (ref 135–145)
TCO2: 40 mmol/L — ABNORMAL HIGH (ref 22–32)
pCO2 arterial: 57.3 mmHg — ABNORMAL HIGH (ref 32–48)
pH, Arterial: 7.431 (ref 7.35–7.45)
pO2, Arterial: 64 mmHg — ABNORMAL LOW (ref 83–108)

## 2021-08-25 LAB — GLUCOSE, CAPILLARY
Glucose-Capillary: 134 mg/dL — ABNORMAL HIGH (ref 70–99)
Glucose-Capillary: 96 mg/dL (ref 70–99)
Glucose-Capillary: 84 mg/dL (ref 70–99)
Glucose-Capillary: 180 mg/dL — ABNORMAL HIGH (ref 70–99)

## 2021-08-25 LAB — BASIC METABOLIC PANEL
Anion gap: 6 (ref 5–15)
BUN: 34 mg/dL — ABNORMAL HIGH (ref 8–23)
CO2: 33 mmol/L — ABNORMAL HIGH (ref 22–32)
Calcium: 8.3 mg/dL — ABNORMAL LOW (ref 8.9–10.3)
Chloride: 96 mmol/L — ABNORMAL LOW (ref 98–111)
Creatinine, Ser: 1.45 mg/dL — ABNORMAL HIGH (ref 0.61–1.24)
GFR, Estimated: 52 mL/min — ABNORMAL LOW (ref >60)
Glucose, Bld: 163 mg/dL — ABNORMAL HIGH (ref 70–99)
Potassium: 3.9 mmol/L (ref 3.5–5.1)
Sodium: 135 mmol/L (ref 135–145)

## 2021-08-25 LAB — HEPARIN LEVEL (UNFRACTIONATED): Heparin Unfractionated: 0.37 IU/mL (ref 0.30–0.70)

## 2021-08-25 LAB — CBC
HCT: 26.3 % — ABNORMAL LOW (ref 39.0–52.0)
Hemoglobin: 8.6 g/dL — ABNORMAL LOW (ref 13.0–17.0)
MCH: 29.8 pg (ref 26.0–34.0)
MCHC: 32.7 g/dL (ref 30.0–36.0)
MCV: 91 fL (ref 80.0–100.0)
Platelets: 193 10*3/uL (ref 150–400)
RBC: 2.89 MIL/uL — ABNORMAL LOW (ref 4.22–5.81)
RDW: 15.8 % — ABNORMAL HIGH (ref 11.5–15.5)
WBC: 8.4 10*3/uL (ref 4.0–10.5)
nRBC: 0 % (ref 0.0–0.2)

## 2021-08-25 LAB — SURGICAL PCR SCREEN
MRSA, PCR: POSITIVE — AB
Staphylococcus aureus: POSITIVE — AB

## 2021-08-25 LAB — PROTIME-INR
INR: 1.2 (ref 0.8–1.2)
Prothrombin Time: 15.4 seconds — ABNORMAL HIGH (ref 11.4–15.2)

## 2021-08-25 SURGERY — RIGHT/LEFT HEART CATH AND CORONARY ANGIOGRAPHY
Anesthesia: LOCAL

## 2021-08-25 MED ORDER — ATORVASTATIN CALCIUM 80 MG PO TABS
80.0000 mg | ORAL_TABLET | Freq: Every day | ORAL | Status: DC
  Administered 2021-08-26 – 2021-09-03 (×9): 80 mg via ORAL
  Filled 2021-08-25 (×9): qty 1

## 2021-08-25 MED ORDER — SODIUM CHLORIDE 0.9 % WEIGHT BASED INFUSION
1.0000 mL/kg/h | INTRAVENOUS | Status: AC
  Administered 2021-08-25: 1 mL/kg/h via INTRAVENOUS

## 2021-08-25 MED ORDER — ASPIRIN 81 MG PO CHEW: 81.0000 mg | CHEWABLE_TABLET | ORAL | Status: DC

## 2021-08-25 MED ORDER — DIAZEPAM 5 MG PO TABS: 5.0000 mg | ORAL_TABLET | Freq: Four times a day (QID) | ORAL | Status: DC | PRN

## 2021-08-25 MED ORDER — LIDOCAINE HCL (PF) 1 % IJ SOLN
INTRAMUSCULAR | Status: DC | PRN
Start: 2021-08-25 — End: 2021-08-25
  Administered 2021-08-25: 5 mL

## 2021-08-25 MED ORDER — HEPARIN SODIUM (PORCINE) 1000 UNIT/ML IJ SOLN
INTRAMUSCULAR | Status: DC | PRN
  Administered 2021-08-25: 5000 [IU] via INTRAVENOUS

## 2021-08-25 MED ORDER — SODIUM CHLORIDE 0.9% FLUSH
3.0000 mL | Freq: Two times a day (BID) | INTRAVENOUS | Status: DC
  Administered 2021-08-25 – 2021-09-03 (×16): 3 mL via INTRAVENOUS

## 2021-08-25 MED ORDER — IOHEXOL 350 MG/ML SOLN
INTRAVENOUS | Status: DC | PRN
  Administered 2021-08-25: 55 mL

## 2021-08-25 MED ORDER — ACETAMINOPHEN 325 MG PO TABS: 650.0000 mg | ORAL_TABLET | ORAL | Status: DC | PRN

## 2021-08-25 MED ORDER — CLOPIDOGREL BISULFATE 75 MG PO TABS
75.0000 mg | ORAL_TABLET | Freq: Every day | ORAL | Status: DC
  Administered 2021-08-26 – 2021-09-03 (×9): 75 mg via ORAL
  Filled 2021-08-25 (×9): qty 1

## 2021-08-25 MED ORDER — MIDAZOLAM HCL 2 MG/2ML IJ SOLN
INTRAMUSCULAR | Status: DC | PRN
  Administered 2021-08-25: 1 mg via INTRAVENOUS

## 2021-08-25 MED ORDER — LIDOCAINE HCL (PF) 1 % IJ SOLN
INTRAMUSCULAR | Status: AC
  Filled 2021-08-25: qty 30

## 2021-08-25 MED ORDER — LABETALOL HCL 5 MG/ML IV SOLN: 10.0000 mg | INTRAVENOUS | Status: AC | PRN

## 2021-08-25 MED ORDER — SODIUM CHLORIDE 0.9 % IV SOLN: INTRAVENOUS | Status: DC

## 2021-08-25 MED ORDER — SODIUM CHLORIDE 0.9 % IV SOLN: 250.0000 mL | INTRAVENOUS | Status: DC | PRN

## 2021-08-25 MED ORDER — SODIUM CHLORIDE 0.9% FLUSH
3.0000 mL | INTRAVENOUS | Status: DC | PRN
Start: 2021-08-25 — End: 2021-08-25

## 2021-08-25 MED ORDER — HEPARIN SODIUM (PORCINE) 1000 UNIT/ML IJ SOLN
INTRAMUSCULAR | Status: AC
  Filled 2021-08-25: qty 10

## 2021-08-25 MED ORDER — VERAPAMIL HCL 2.5 MG/ML IV SOLN
INTRAVENOUS | Status: DC | PRN
  Administered 2021-08-25: 10 mL via INTRA_ARTERIAL

## 2021-08-25 MED ORDER — HEPARIN (PORCINE) IN NACL 1000-0.9 UT/500ML-% IV SOLN
INTRAVENOUS | Status: AC
  Filled 2021-08-25: qty 1000

## 2021-08-25 MED ORDER — ASPIRIN 81 MG PO CHEW
81.0000 mg | CHEWABLE_TABLET | ORAL | Status: AC
  Administered 2021-08-25: 81 mg via ORAL
  Filled 2021-08-25: qty 1

## 2021-08-25 MED ORDER — SODIUM CHLORIDE 0.9% FLUSH: 3.0000 mL | INTRAVENOUS | Status: DC | PRN

## 2021-08-25 MED ORDER — HEPARIN (PORCINE) IN NACL 1000-0.9 UT/500ML-% IV SOLN
INTRAVENOUS | Status: DC | PRN
Start: 2021-08-25 — End: 2021-08-25
  Administered 2021-08-25 (×2): 500 mL

## 2021-08-25 MED ORDER — IPRATROPIUM-ALBUTEROL 0.5-2.5 (3) MG/3ML IN SOLN
3.0000 mL | Freq: Two times a day (BID) | RESPIRATORY_TRACT | Status: DC
  Administered 2021-08-26 – 2021-08-31 (×11): 3 mL via RESPIRATORY_TRACT
  Filled 2021-08-25 (×13): qty 3

## 2021-08-25 MED ORDER — VERAPAMIL HCL 2.5 MG/ML IV SOLN
INTRAVENOUS | Status: AC
Start: 2021-08-25 — End: ?
  Filled 2021-08-25: qty 2

## 2021-08-25 MED ORDER — ONDANSETRON HCL 4 MG/2ML IJ SOLN: 4.0000 mg | Freq: Four times a day (QID) | INTRAMUSCULAR | Status: DC | PRN

## 2021-08-25 MED ORDER — SODIUM CHLORIDE 0.9% FLUSH
3.0000 mL | Freq: Two times a day (BID) | INTRAVENOUS | Status: DC
  Administered 2021-08-26 – 2021-09-03 (×17): 3 mL via INTRAVENOUS

## 2021-08-25 MED ORDER — HEPARIN SODIUM (PORCINE) 5000 UNIT/ML IJ SOLN: 5000.0000 [IU] | Freq: Three times a day (TID) | INTRAMUSCULAR | Status: DC

## 2021-08-25 MED ORDER — MIDAZOLAM HCL 2 MG/2ML IJ SOLN
INTRAMUSCULAR | Status: AC
  Filled 2021-08-25: qty 2

## 2021-08-25 MED ORDER — HYDRALAZINE HCL 20 MG/ML IJ SOLN: 10.0000 mg | INTRAMUSCULAR | Status: AC | PRN

## 2021-08-25 SURGICAL SUPPLY — 16 items
CATH BALLN WEDGE 5F 110CM (CATHETERS) ×1 IMPLANT
CATH INFINITI 5 FR AL2 (CATHETERS) ×1 IMPLANT
CATH OPTITORQUE TIG 4.0 5F (CATHETERS) ×1 IMPLANT
DEVICE RAD COMP TR BAND LRG (VASCULAR PRODUCTS) ×1 IMPLANT
GLIDESHEATH SLEND SS 6F .021 (SHEATH) ×1 IMPLANT
GUIDEWIRE .025 260CM (WIRE) ×1 IMPLANT
GUIDEWIRE INQWIRE 1.5J.035X260 (WIRE) IMPLANT
INQWIRE 1.5J .035X260CM (WIRE) ×2
KIT HEART LEFT (KITS) ×2 IMPLANT
PACK CARDIAC CATHETERIZATION (CUSTOM PROCEDURE TRAY) ×2 IMPLANT
SHEATH GLIDE SLENDER 4/5FR (SHEATH) ×1 IMPLANT
SHEATH PROBE COVER 6X72 (BAG) ×1 IMPLANT
SYR MEDRAD MARK 7 150ML (SYRINGE) ×2 IMPLANT
TRANSDUCER W/STOPCOCK (MISCELLANEOUS) ×2 IMPLANT
TUBING CIL FLEX 10 FLL-RA (TUBING) ×2 IMPLANT
WIRE EMERALD ST .035X260CM (WIRE) ×1 IMPLANT

## 2021-08-25 NOTE — Plan of Care (Signed)

## 2021-08-25 NOTE — TOC Progression Note (Signed)
Transition of Care (TOC) - Progression Note  ? ? ?Patient Details  ?Name: Rodney [name] ?MRN: [id number] ?Date of Birth: 05/24/1950 ? ?Transition of Care (TOC) CM/SW Contact  ?Zenon Mayo, RN ?Phone Number: ?08/25/2021, 3:34 PM ? ?Clinical Narrative:    ?He is active with [facility] for HHRN,HHPT, and HHOT, will need resumption orders.  Here with a/chronic resp failure, severe AS, for TAVR work up. And per vascular will need 2nd toe amputation early this week. TOC will continue to follow for dc needs. Will need HH orders.  ? ? ?Expected Discharge Plan: [place] ?Barriers to Discharge: Continued Medical Work up ? ?Expected Discharge Plan and Services ?Expected Discharge Plan: [place] ?  ?Discharge Planning Services: CM Consult ?Post Acute Care Choice: Home Health ?Living arrangements for the past 2 months: [place] ?                ?  ?DME Agency: NA ?  ?  ?  ?HH Arranged: RN, PT, OT ?[place] Agency: [place] ?Date HH Agency Contacted: 08/20/21 ?Time [place]: 2703 ?Representative spoke with at [place]: Tommi Rumps ? ? ?Social Determinants of Health (SDOH) Interventions ?  ? ?Readmission Risk Interventions ? ?  08/21/2021  ?  5:10 PM 06/26/2021  ?  4:36 PM 05/16/2021  ?  3:13 PM  ?Readmission Risk Prevention Plan  ?Transportation Screening Complete Complete Complete  ?Medication Review Press photographer) Complete Complete Complete  ?PCP or Specialist appointment within 3-5 days of discharge Complete Complete Complete  ?[place] or Home Care Consult Complete Complete Complete  ?SW Recovery Care/Counseling Consult Complete Complete Complete  ?Palliative Care Screening Not Applicable Not Applicable Not Applicable  ?[place] Not Applicable Complete Not Applicable  ? ? ?

## 2021-08-25 NOTE — H&P (View-Only) (Signed)
?Cardiology Progress Note  ?Patient ID: [name] ?MRN: [id number] ?DOB: March 17, 1951 ?Date of Encounter: 08/25/2021 ? ?Primary Cardiologist: Evalina Field, MD ? ?Subjective  ? ?Chief Complaint: None.  ? ?HPI: Left or right heart catheterization planned for today.  Suspect he will need possible right second toe amputation. ? ?ROS:  ?All other ROS reviewed and negative. Pertinent positives noted in the HPI.    ? ?Inpatient Medications  ?Scheduled Meds: ? atorvastatin  80 mg Oral Daily  ? carvedilol  6.25 mg Oral BID WC  ? clopidogrel  75 mg Oral Daily  ? fidaxomicin  200 mg Oral BID  ? fluticasone furoate-vilanterol  1 puff Inhalation Daily  ? furosemide  40 mg Oral Daily  ? insulin aspart  0-20 Units Subcutaneous TID WC  ? insulin detemir  15 Units Subcutaneous QHS  ? ipratropium-albuterol  3 mL Nebulization BID  ? pantoprazole  40 mg Oral BID  ? sodium chloride flush  3 mL Intravenous Q12H  ? umeclidinium bromide  1 puff Inhalation Daily  ? ?Continuous Infusions: ? sodium chloride    ? sodium chloride 50 mL/hr at 08/25/21 0644  ? heparin 1,500 Units/hr (08/24/21 1616)  ? ?PRN Meds: ?sodium chloride, ipratropium-albuterol, sodium chloride flush, traZODone  ? ?Vital Signs  ? ?Vitals:  ? 08/24/21 1957 08/24/21 2008 08/25/21 0029 08/25/21 0602  ?BP:  116/63 126/71 140/69  ?Pulse:  76 75 75  ?Resp:  20 16 (!) 22  ?Temp:  97.6 ?F (36.4 ?C) 98.1 ?F (36.7 ?C) 97.6 ?F (36.4 ?C)  ?TempSrc:  Oral Oral Oral  ?SpO2: 98% 96% 100% 100%  ?Weight:   95.8 kg   ?Height:      ? ? ?Intake/Output Summary (Last 24 hours) at 08/25/2021 0739 ?Last data filed at 08/25/2021 0654 ?Gross per 24 hour  ?Intake 1445.25 ml  ?Output 3300 ml  ?Net -1854.75 ml  ? ? ?  08/25/2021  ? 12:29 AM 08/24/2021  ?  1:07 AM 08/23/2021  ?  5:00 AM  ?Last 3 Weights  ?Weight (lbs) 211 lb 3.2 oz 213 lb 6.5 oz 213 lb 6.5 oz  ?Weight (kg) 95.8 kg 96.8 kg 96.8 kg  ?   ? ?Telemetry  ?Overnight telemetry shows atrial fibrillation versus atrial flutter heart rate in the  70s, which I personally reviewed.  ? ?Physical Exam  ? ?Vitals:  ? 08/24/21 1957 08/24/21 2008 08/25/21 0029 08/25/21 0602  ?BP:  116/63 126/71 140/69  ?Pulse:  76 75 75  ?Resp:  20 16 (!) 22  ?Temp:  97.6 ?F (36.4 ?C) 98.1 ?F (36.7 ?C) 97.6 ?F (36.4 ?C)  ?TempSrc:  Oral Oral Oral  ?SpO2: 98% 96% 100% 100%  ?Weight:   95.8 kg   ?Height:      ?  ?Intake/Output Summary (Last 24 hours) at 08/25/2021 0739 ?Last data filed at 08/25/2021 0654 ?Gross per 24 hour  ?Intake 1445.25 ml  ?Output 3300 ml  ?Net -1854.75 ml  ?  ? ?  08/25/2021  ? 12:29 AM 08/24/2021  ?  1:07 AM 08/23/2021  ?  5:00 AM  ?Last 3 Weights  ?Weight (lbs) 211 lb 3.2 oz 213 lb 6.5 oz 213 lb 6.5 oz  ?Weight (kg) 95.8 kg 96.8 kg 96.8 kg  ?  Body mass index is 28.64 kg/m?.  ?General: Well nourished, well developed, in no acute distress ?Head: Atraumatic, normal size  ?Eyes: PEERLA, EOMI  ?Neck: Supple, no JVD ?Endocrine: No thryomegaly ?Cardiac: Normal S1, S2; irregular rhythm,  harsh 3 out of 6 systolic ejection murmur, radiating to carotids, late peaking ?Lungs: Clear to auscultation bilaterally, no wheezing, rhonchi or rales  ?Abd: Soft, nontender, no hepatomegaly  ?Ext: Status post left BKA, status post right great toe amputation, gangrene noted in the right second toe ?Musculoskeletal: No deformities, BUE and BLE strength normal and equal ?Skin: Warm and dry, no rashes   ?Neuro: Alert and oriented to person, place, time, and situation, CNII-XII grossly intact, no focal deficits  ?Psych: Normal mood and affect  ? ?Labs  ?High Sensitivity Troponin:   ?Recent Labs  ?Lab 08/19/21 ?6967 08/19/21 ?8938  ?TROPONINIHS 36* 40*  ?   ?Cardiac EnzymesNo results for input(s): TROPONINI in the last 168 hours. No results for input(s): TROPIPOC in the last 168 hours.  ?Chemistry ?Recent Labs  ?Lab 08/23/21 ?1017 08/24/21 ?0229 08/25/21 ?0134  ?NA 135 133* 135  ?K 4.2 3.9 3.9  ?CL 95* 92* 96*  ?CO2 32 34* 33*  ?GLUCOSE 167* 213* 163*  ?BUN 32* 33* 34*  ?CREATININE 1.39* 1.33*  1.45*  ?CALCIUM 8.8* 8.5* 8.3*  ?GFRNONAA 55* 58* 52*  ?ANIONGAP '8 7 6  '$ ?  ?Hematology ?Recent Labs  ?Lab 08/23/21 ?5102 08/24/21 ?0229 08/25/21 ?0134  ?WBC 8.2 7.0 8.4  ?RBC 2.94* 2.87* 2.89*  ?HGB 8.7* 8.4* 8.6*  ?HCT 26.8* 26.0* 26.3*  ?MCV 91.2 90.6 91.0  ?MCH 29.6 29.3 29.8  ?MCHC 32.5 32.3 32.7  ?RDW 15.9* 15.6* 15.8*  ?PLT 215 186 193  ? ?BNP ?Recent Labs  ?Lab 08/19/21 ?5852  ?BNP 692.1*  ?  ?DDimer No results for input(s): DDIMER in the last 168 hours.  ? ?Radiology  ?No results found. ? ?Cardiac Studies  ?TTE 06/23/2021 ? 1. Left ventricular ejection fraction, by estimation, is 55 to 60%. The  ?left ventricle has normal function. The left ventricle has no regional  ?wall motion abnormalities. There is mild concentric left ventricular  ?hypertrophy. Left ventricular diastolic  ?parameters are indeterminate.  ? 2. Right ventricular systolic function is normal. The right ventricular  ?size is normal. There is mildly elevated pulmonary artery systolic  ?pressure.  ? 3. Left atrial size was mildly dilated.  ? 4. Right atrial size was mildly dilated.  ? 5. The mitral valve is grossly normal. Mild mitral valve regurgitation.  ?Moderate mitral annular calcification.  ? 6. The aortic valve is calcified. There is severe calcifcation of the  ?aortic valve. There is severe thickening of the aortic valve. Aortic valve  ?regurgitation is trivial. Severe aortic valve stenosis. Aortic valve area,  ?by VTI measures 0.75 cm?Marland Kitchen Aortic  ?valve mean gradient measures 43.0 mmHg. Aortic valve Vmax measures 4.30  ?m/s. DI 0.22  ? 7. Aortic dilatation noted. There is borderline dilatation of the aortic  ?root, measuring 36 mm. There is mild dilatation of the ascending aorta,  ?measuring 38 mm.  ? 8. The inferior vena cava is dilated in size with <50% respiratory  ?variability, suggesting right atrial pressure of 15 mmHg.  ? 9. A small pericardial effusion is present. The pericardial effusion is  ?posterior and lateral to the left  ventricle.  ? ?Patient Profile  ?MADDON HORTON is a 71 y.o. male with COPD, severe stenosis, heart failure with reduced ejection fraction with recovery of ejection fraction, PAD status post left BKA, persistent atrial fibrillation on Coumadin, CKD stage III, CAD, diabetes who was admitted on 08/19/2021 for acute on chronic respiratory failure secondary COPD.  Cardiology was consulted for severe aortic stenosis. ? ?  Assessment & Plan  ? ?#Severe AS ?-Aortic stenosis has progressed.  Evaluated by TAVR team.  Plan for right and left heart catheterization today.  Anatomy on CT is notable for 29 mm S3. ?-Risk and benefits of left and right heart catheterization explained.  He is willing to proceed. ? ?Shared Decision Making/Informed Consent ?The risks [stroke (1 in 1000), death (1 in 38), kidney failure [usually temporary] (1 in 500), bleeding (1 in 200), allergic reaction [possibly serious] (1 in 200)], benefits (diagnostic support and management of coronary artery disease) and alternatives of a cardiac catheterization were discussed in detail with Mr. Cancio and he is willing to proceed. ? ? ?#Systolic heart failure with recovered ejection fraction, 55-60% ?-EF has recovered with medical therapy.  We will continue this for now. ?-Euvolemic on examination today. ?-Continue carvedilol. ?-Would likely add back an ARB as we are able pending kidney function. ? ?#PAD ?-Status post left BKA ?-Status post right femoropopliteal bypass with recent R SFA stent @ prior anastomosis of bypass graft (05/12/2021) ?-Status post right great toe amputation ?- we will need second right toe amputation per vascular surgery ? ?#Persistent atrial fibrillation ?-Cannot afford Eliquis.  On home Coumadin.  On heparin drip right now due to need for procedures. ?-EKG and telemetry appear to represent atrial fibrillation.  Would recommend continue with rate control strategy. ? ?#CKD stage III ?-Stable.  Plan to add ARB back at some  point. ? ?#CAD ?-Known history of CTO of the RCA ?-On aspirin, statin and beta-blocker.  No symptoms of angina. ?-Left heart catheterization today for TAVR work-up. ? ?#COPD ?-stable    ? ?For questions or updates, ple

## 2021-08-25 NOTE — Progress Notes (Signed)
OT Cancellation Note ? ?Patient Details ?Name: Rodney [name] ?MRN: [id number] ?DOB: 1950-11-23 ? ? ?Cancelled Treatment:    Reason Eval/Treat Not Completed: Patient at procedure or test/ unavailable Patient being taken for a procedure at time of OT entry. OT will continue to follow acutely.  ? ?Rodney Ports E. Malaki Koury, OTR/L ?Acute Rehabilitation Services ?[phone] ?[phone] ? ? ?Rodney [name] ?08/25/2021, 2:22 PM ?

## 2021-08-25 NOTE — TOC Benefit Eligibility Note (Signed)
Patient Advocate Encounter ?  ?Insurance verification completed.   ?  ?The patient is currently admitted and upon discharge could be taking DIFICID. ?  ?The current 30 day co-pay is, $1561.58.  ? ?The patient is insured through [employer]. ? ? ?   ?

## 2021-08-25 NOTE — Progress Notes (Addendum)
Family Medicine Teaching Service ?Daily Progress Note ?Intern Pager: [phone] ? ?Patient name: Rodney [name] Medical record number: [id number] ?Date of birth: [birth year] Age: 71 y.o. Gender: male ? ?Primary Care Provider: [name], MD ?Consultants: Cardiology, vascular surgery ?Code Status: Full code ? ?Pt Overview and Major Events to Date:  ?4/25-admitted, placed on BiPAP briefly then weaned to nasal cannula, started cefepime and given IV methylprednisolone ?4/26-transitioned steroids to oral prednisone, transfused 1u PRBC ?4/27-cefepime transitioned to cefdinir ?4/29-completed course of cefdinir and prednisone ?  ?Assessment and Plan: ?Mr. Warwick is a 88-year initially admitted for COPD exacerbation (now resolved).  Now experiencing C. difficile recurrence and undergoing preop preparation for TAVR, which includes L+RHC and amputation of gangrenous toe. PMHx significant for: COPD, HFrEF, severe AS, CAD, PAD s/p left BKA, PAF on warfarin, T2DM, CKD stage III, anemia of chronic disease, protein calorie malnutrition. ?  ?C. difficile recurrence ?Reported about 8 bowel movements yesterday, however they are more formed.  0 bowel movements this morning. ?- Continued Pradaxa mycin 200 mg BID x10d, end date 09/02/21  ? ?Severe AS  HFpEF ?Peripheral edema, with no other signs of volume overload. Currently on home dose Lasix. K and Cr remains stable.  ?- Strict I/Os and daily weights ?- BMP and Mg2+ daily ?- K+ >4, Mg2+ >2, replete as needed ?- Continue home Lasix 40 mg PO daily ?- TAVR preop: L+R HC Monday 5/1, toe amputation, maybe dental extraction ? 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 ?Acute on chronic hypoxemic respiratory failure secondary to COPD exacerbation; resolved ?Baseline 3L [place] currently. Completed prednisone and cefdinir x5d 4/30. ?- Duo nebs BID ?- Continued Breo Ellipta daily ?- O2 goal:  80-92% ? ?Bilateral pleural effusions ?O2 per above. ?- Monitoring ?  ?Anemia of CKD and iron deficiency ?Holding IV iron currently due to soon toe amputation. Would like to provide IV Fe to patient after amputation and before dc if possible. Hb stable. ?- Transfusion if Hb <8 ?  ?CKD III ?Stable, at baseline Cr ? ?T2DM, controlled ?- Continued Levemir 15u daily ?- mSSI ? ?pAF, rate controlled and AC ?- Coreg 6.25 mg BID  ?- Continued heparin until dc for procedures, then bridge to warfarin ?   ?Mediastinal lymph nodes ?- Outpatient PET follow-up ?  ?GERD ?- Protonix 40 mg ?  ?Insomnia ?- Trazodone nightly as needed ? ?FEN/GI: n.p.o. for heart cath  ?PPx: Protonix ?Dispo:Pending PT recommendations  pending clinical improvement . Barriers include planned to amputation and left plus right heart cath.  ? ?Subjective:  ?No family at bedside. ? ?Patient was resting comfortably in bed, no distress. Reported improved BM, but had about 8 yesterday that were more formed. Aside from hospital noises, he has no other concerns. ? ?Objective: ?Temp:  [97.6 ?F (36.4 ?C)-98.4 ?F (36.9 ?C)] 98.4 ?F (36.9 ?C) (05/01 7616) ?Pulse Rate:  [74-76] 75 (05/01 0933) ?Resp:  [16-22] 20 (05/01 0752) ?BP: (116-153)/(63-87) 153/83 (05/01 0933) ?SpO2:  [96 %-100 %] 100 % (05/01 0801) ?Weight:  [95.8 kg] 95.8 kg (05/01 0029) ?Physical Exam: ?General: Awake, alert, no acute distress ?Cardiovascular: Irregularly irregular ?Respiratory: CTAB. Normal breathing efforts.  ?Abdomen: Soft, nondistended, nontender ? ?Laboratory: ?Recent Labs  ?Lab 08/23/21 ?0737 08/24/21 ?0229 08/25/21 ?0134  ?WBC 8.2 7.0 8.4  ?HGB 8.7* 8.4* 8.6*  ?HCT 26.8* 26.0* 26.3*  ?PLT 215 186 193  ? ?Recent Labs  ?  Lab 08/23/21 ?1448 08/24/21 ?0229 08/25/21 ?0134  ?NA 135 133* 135  ?K 4.2 3.9 3.9  ?CL 95* 92* 96*  ?CO2 32 34* 33*  ?BUN 32* 33* 34*  ?CREATININE 1.39* 1.33* 1.45*  ?CALCIUM 8.8* 8.5* 8.3*  ?GLUCOSE 167* 213* 163*  ? ?Imaging/Diagnostic Tests: ?None new last 24  hours ? ?Merrily Brittle, DO ?08/25/2021, 10:09 AM ?PGY-1, [place] [facility] ?[place] Intern pager: [phone], text pages welcome ?

## 2021-08-25 NOTE — Progress Notes (Signed)
Physical Therapy Treatment ?Patient Details ?Name: Rodney [name] ?MRN: [id number] ?DOB: June 02, 1950 ?Today's Date: 08/25/2021 ? ? ?History of Present Illness Patient is a 71 yo male presenting to the ED with SOB on 08/19/21. Admitted same day with acute respiratory failure and COPD exacerbation. Patient with 4 admissions within the last 6 months.  PMH is significant for DM 2, obesity, tobacco abuse, hypertension, protein calorie malnutrition, aortic stenosis, anemia chronic disease, PVD, HFrEF, PAF, status post BKA, CAD, CKD stage III, COPD, COVID+ ? ?  ?PT Comments  ? ? Pt received in supine, agreeable to transfer to/from EOB and seated exercises at EOB with max encouragement. Pt frustrated due to poor sleep previous evening but agreeable to session in AM due to upcoming plan for L/R heart cath today. Emphasis on seated balance, UE/LE therapeutic exercises for strengthening and importance of participation in functional mobility tasks, especially transfer training to maintain strength/endurance better. Pt utilized red theraband for UE exercises with cues for technique/reps. Pt refusing participation in transfer training despite education. Pt continues to benefit from PT services to progress toward functional mobility goals.    ?Recommendations for follow up therapy are one component of a multi-disciplinary discharge planning process, led by the attending physician.  Recommendations may be updated based on patient status, additional functional criteria and insurance authorization. ? ?Follow Up Recommendations ? Home health PT (pending progress with OOB mobility) ?  ?  ?Assistance Recommended at Discharge Frequent or constant Supervision/Assistance  ?Patient can return home with the following A lot of help with walking and/or transfers;A little help with bathing/dressing/bathroom;Assistance with cooking/housework;Assistance with feeding;Direct supervision/assist for medications management;Direct supervision/assist for  financial management;Assist for transportation;Help with stairs or ramp for entrance ?  ?Equipment Recommendations ? None recommended by PT  ?  ?Recommendations for Other Services   ? ? ?  ?Precautions / Restrictions Precautions ?Precautions: Fall ?Precaution Comments: plan for L/R heart cath 5/1 afternoon ?Restrictions ?Weight Bearing Restrictions: No  ?  ? ?Mobility ? Bed Mobility ?Overal bed mobility: Needs Assistance ?Bed Mobility: Supine to Sit, Sit to Supine ?Rolling: Independent ?Sidelying to sit: Mod assist ?  ?Sit to supine: Supervision ?  ?General bed mobility comments: pt reaching for therapist assist to pull him to sitting, able to return to supine and roll without any physical assist but needed min cues for line awareness ?  ? ?Transfers ?Overall transfer level: Needs assistance ?Equipment used: None ?Transfers: Bed to chair/wheelchair/BSC ?  ?  ?  ?  ?  ? Lateral/Scoot Transfers: Min guard ?General transfer comment: pt declining to leave EOB, but able to demo scooting along EOB prior to return to supine; pt refusing to scoot toward end of bed for more UE strengthening due to fatigue/poor sleep ?  ? ?  ?Balance Overall balance assessment: Needs assistance ?Sitting-balance support: Bilateral upper extremity supported, Feet supported ?Sitting balance-Leahy Scale: Fair ?Sitting balance - Comments: pt able to sit EOB with no LOB, performed seated UE exercises with U UE support ?  ?  ?  ?Standing balance comment: pt refused ?  ?  ?  ?  ?  ?  ?  ?  ?  ?  ?  ?  ? ?  ?Cognition Arousal/Alertness: Awake/alert (initially sleeping but easily awoken) ?Behavior During Therapy: Flat affect, Agitated ?Overall Cognitive Status: Within Functional Limits for tasks assessed ?  ?  ?  ?   ?General Comments: flat/frustrated affect, pt not receptive to most questions/suggestions and reports "I woke up at 2am (  to prep for L/R heart cath procedure that is still pending) and I just want to sleep." Limited participation, pt  instructed on pursed-lip breathing due to SpO2 desat but not receptive to "taking a rest break" either. Mildly impulsive. ?  ?  ? ?  ?Exercises General Exercises - Upper Extremity ?Shoulder Flexion: AROM, Both, 10 reps, Theraband, Seated ?Theraband Level (Shoulder Flexion): Level 2 (Red) ?Shoulder ABduction: AROM, Both, 10 reps, Seated, Theraband ?Theraband Level (Shoulder Abduction): Level 2 (Red) ?Shoulder Horizontal ABduction: AROM, Both, 10 reps, Seated, Theraband ?Theraband Level (Shoulder Horizontal Abduction): Level 2 (Red) ?Elbow Extension: AROM, Both, 10 reps, Seated, Theraband ?Theraband Level (Elbow Extension): Level 2 (Red) ?General Exercises - Lower Extremity ?Long Arc Quad: AROM, Both, 10 reps, Seated ?Hip Flexion/Marching: AROM, Both, 10 reps, Seated ?Other Exercises ?Other Exercises: pt refusing seated scooting or further exercises after above due to fatigue/irritability ? ?  ?General Comments General comments (skin integrity, edema, etc.): SpO2 desat to 86% on 3L O2 [place], with seated rest break improved to 87-88% but again desat, so increased to 4L O2 [place] and remains 91% and greater, RN notified ?  ?  ? ?Pertinent Vitals/Pain Pain Assessment ?Pain Assessment: No/denies pain ?Pain Intervention(s): Monitored during session, Repositioned  ? ? ? ?PT Goals (current goals can now be found in the care plan section) Acute Rehab PT Goals ?Patient Stated Goal: to sleep better, get home ?PT Goal Formulation: With patient ?Time For Goal Achievement: 09/03/21 ?Progress towards PT goals: Progressing toward goals (slow progress, limited participation) ? ?  ?Frequency ? ? ? Min 3X/week ? ? ? ?  ?PT Plan Current plan remains appropriate  ? ? ?   ?AM-PAC PT "6 Clicks" Mobility   ?Outcome Measure ? Help needed turning from your back to your side while in a flat bed without using bedrails?: None ?Help needed moving from lying on your back to sitting on the side of a flat bed without using bedrails?: A Lot ?Help needed  moving to and from a bed to a chair (including a wheelchair)?: A Little ?Help needed standing up from a chair using your arms (e.g., wheelchair or bedside chair)?: Total ?Help needed to walk in hospital room?: Total ?Help needed climbing 3-5 steps with a railing? : Total ?6 Click Score: 12 ? ?  ?End of Session Equipment Utilized During Treatment: Oxygen ?Activity Tolerance: Patient limited by fatigue;Other (comment) (pt c/o poor sleep due to pre-op prep) ?Patient left: in bed;with call bell/phone within reach;with bed alarm set ?Nurse Communication: Mobility status;Other (comment) (wall O2 increased to 4L/min due to desat at rest and with minimal mobility on 3L) ?PT Visit Diagnosis: Muscle weakness (generalized) (M62.81);Other abnormalities of gait and mobility (R26.89) ?  ? ? ?Time: 7654-6503 ?PT Time Calculation (min) (ACUTE ONLY): 12 min ? ?Charges:  $Therapeutic Exercise: 8-22 mins          ?          ? ?Keny Donald P., PTA ?Acute Rehabilitation Services ?Secure Chat Preferred 9a-5:30pm ?Office: [phone]  ? ? ?Kara Pacer Renuka Farfan ?08/25/2021, 11:08 AM ? ?

## 2021-08-25 NOTE — Interval H&P Note (Signed)
History and Physical Interval Note: ? ?08/25/2021 ?2:38 PM ? ?Rodney [name]  has presented today for surgery, with the diagnosis of severe aortic stenosis.  The various methods of treatment have been discussed with the patient and family. After consideration of risks, benefits and other options for treatment, the patient has consented to  Procedure(s): ?RIGHT/LEFT HEART CATH AND CORONARY ANGIOGRAPHY (N/A) as a surgical intervention.  The patient's history has been reviewed, patient examined, no change in status, stable for surgery.  I have reviewed the patient's chart and labs.  Questions were answered to the patient's satisfaction.   ? ? ?Shelva Majestic ? ? ?

## 2021-08-25 NOTE — Progress Notes (Signed)
?Cardiology Progress Note  ?Patient ID: Rodney [name] ?MRN: [id number] ?DOB: [birth year] ?Date of Encounter: 08/25/2021 ? ?Primary Cardiologist: Evalina Field, MD ? ?Subjective  ? ?Chief Complaint: None.  ? ?HPI: Left or right heart catheterization planned for today.  Suspect he will need possible right second toe amputation. ? ?ROS:  ?All other ROS reviewed and negative. Pertinent positives noted in the HPI.    ? ?Inpatient Medications  ?Scheduled Meds: ? atorvastatin  80 mg Oral Daily  ? carvedilol  6.25 mg Oral BID WC  ? clopidogrel  75 mg Oral Daily  ? fidaxomicin  200 mg Oral BID  ? fluticasone furoate-vilanterol  1 puff Inhalation Daily  ? furosemide  40 mg Oral Daily  ? insulin aspart  0-20 Units Subcutaneous TID WC  ? insulin detemir  15 Units Subcutaneous QHS  ? ipratropium-albuterol  3 mL Nebulization BID  ? pantoprazole  40 mg Oral BID  ? sodium chloride flush  3 mL Intravenous Q12H  ? umeclidinium bromide  1 puff Inhalation Daily  ? ?Continuous Infusions: ? sodium chloride    ? sodium chloride 50 mL/hr at 08/25/21 0644  ? heparin 1,500 Units/hr (08/24/21 1616)  ? ?PRN Meds: ?sodium chloride, ipratropium-albuterol, sodium chloride flush, traZODone  ? ?Vital Signs  ? ?Vitals:  ? 08/24/21 1957 08/24/21 2008 08/25/21 0029 08/25/21 0602  ?BP:  116/63 126/71 140/69  ?Pulse:  76 75 75  ?Resp:  20 16 (!) 22  ?Temp:  97.6 ?F (36.4 ?C) 98.1 ?F (36.7 ?C) 97.6 ?F (36.4 ?C)  ?TempSrc:  Oral Oral Oral  ?SpO2: 98% 96% 100% 100%  ?Weight:   95.8 kg   ?Height:      ? ? ?Intake/Output Summary (Last 24 hours) at 08/25/2021 0739 ?Last data filed at 08/25/2021 0654 ?Gross per 24 hour  ?Intake 1445.25 ml  ?Output 3300 ml  ?Net -1854.75 ml  ? ? ?  08/25/2021  ? 12:29 AM 08/24/2021  ?  1:07 AM 08/23/2021  ?  5:00 AM  ?Last 3 Weights  ?Weight (lbs) 211 lb 3.2 oz 213 lb 6.5 oz 213 lb 6.5 oz  ?Weight (kg) 95.8 kg 96.8 kg 96.8 kg  ?   ? ?Telemetry  ?Overnight telemetry shows atrial fibrillation versus atrial flutter heart rate in the  70s, which I personally reviewed.  ? ?Physical Exam  ? ?Vitals:  ? 08/24/21 1957 08/24/21 2008 08/25/21 0029 08/25/21 0602  ?BP:  116/63 126/71 140/69  ?Pulse:  76 75 75  ?Resp:  20 16 (!) 22  ?Temp:  97.6 ?F (36.4 ?C) 98.1 ?F (36.7 ?C) 97.6 ?F (36.4 ?C)  ?TempSrc:  Oral Oral Oral  ?SpO2: 98% 96% 100% 100%  ?Weight:   95.8 kg   ?Height:      ?  ?Intake/Output Summary (Last 24 hours) at 08/25/2021 0739 ?Last data filed at 08/25/2021 0654 ?Gross per 24 hour  ?Intake 1445.25 ml  ?Output 3300 ml  ?Net -1854.75 ml  ?  ? ?  08/25/2021  ? 12:29 AM 08/24/2021  ?  1:07 AM 08/23/2021  ?  5:00 AM  ?Last 3 Weights  ?Weight (lbs) 211 lb 3.2 oz 213 lb 6.5 oz 213 lb 6.5 oz  ?Weight (kg) 95.8 kg 96.8 kg 96.8 kg  ?  Body mass index is 28.64 kg/m?.  ?General: Well nourished, well developed, in no acute distress ?Head: Atraumatic, normal size  ?Eyes: PEERLA, EOMI  ?Neck: Supple, no JVD ?Endocrine: No thryomegaly ?Cardiac: Normal S1, S2; irregular rhythm,  harsh 3 out of 6 systolic ejection murmur, radiating to carotids, late peaking ?Lungs: Clear to auscultation bilaterally, no wheezing, rhonchi or rales  ?Abd: Soft, nontender, no hepatomegaly  ?Ext: Status post left BKA, status post right great toe amputation, gangrene noted in the right second toe ?Musculoskeletal: No deformities, BUE and BLE strength normal and equal ?Skin: Warm and dry, no rashes   ?Neuro: Alert and oriented to person, place, time, and situation, CNII-XII grossly intact, no focal deficits  ?Psych: Normal mood and affect  ? ?Labs  ?High Sensitivity Troponin:   ?Recent Labs  ?Lab 08/19/21 ?8099 08/19/21 ?8338  ?TROPONINIHS 36* 40*  ?   ?Cardiac EnzymesNo results for input(s): TROPONINI in the last 168 hours. No results for input(s): TROPIPOC in the last 168 hours.  ?Chemistry ?Recent Labs  ?Lab 08/23/21 ?2505 08/24/21 ?0229 08/25/21 ?0134  ?NA 135 133* 135  ?K 4.2 3.9 3.9  ?CL 95* 92* 96*  ?CO2 32 34* 33*  ?GLUCOSE 167* 213* 163*  ?BUN 32* 33* 34*  ?CREATININE 1.39* 1.33*  1.45*  ?CALCIUM 8.8* 8.5* 8.3*  ?GFRNONAA 55* 58* 52*  ?ANIONGAP '8 7 6  '$ ?  ?Hematology ?Recent Labs  ?Lab 08/23/21 ?3976 08/24/21 ?0229 08/25/21 ?0134  ?WBC 8.2 7.0 8.4  ?RBC 2.94* 2.87* 2.89*  ?HGB 8.7* 8.4* 8.6*  ?HCT 26.8* 26.0* 26.3*  ?MCV 91.2 90.6 91.0  ?MCH 29.6 29.3 29.8  ?MCHC 32.5 32.3 32.7  ?RDW 15.9* 15.6* 15.8*  ?PLT 215 186 193  ? ?BNP ?Recent Labs  ?Lab 08/19/21 ?7341  ?BNP 692.1*  ?  ?DDimer No results for input(s): DDIMER in the last 168 hours.  ? ?Radiology  ?No results found. ? ?Cardiac Studies  ?TTE 06/23/2021 ? 1. Left ventricular ejection fraction, by estimation, is 55 to 60%. The  ?left ventricle has normal function. The left ventricle has no regional  ?wall motion abnormalities. There is mild concentric left ventricular  ?hypertrophy. Left ventricular diastolic  ?parameters are indeterminate.  ? 2. Right ventricular systolic function is normal. The right ventricular  ?size is normal. There is mildly elevated pulmonary artery systolic  ?pressure.  ? 3. Left atrial size was mildly dilated.  ? 4. Right atrial size was mildly dilated.  ? 5. The mitral valve is grossly normal. Mild mitral valve regurgitation.  ?Moderate mitral annular calcification.  ? 6. The aortic valve is calcified. There is severe calcifcation of the  ?aortic valve. There is severe thickening of the aortic valve. Aortic valve  ?regurgitation is trivial. Severe aortic valve stenosis. Aortic valve area,  ?by VTI measures 0.75 cm?Marland Kitchen Aortic  ?valve mean gradient measures 43.0 mmHg. Aortic valve Vmax measures 4.30  ?m/s. DI 0.22  ? 7. Aortic dilatation noted. There is borderline dilatation of the aortic  ?root, measuring 36 mm. There is mild dilatation of the ascending aorta,  ?measuring 38 mm.  ? 8. The inferior vena cava is dilated in size with <50% respiratory  ?variability, suggesting right atrial pressure of 15 mmHg.  ? 9. A small pericardial effusion is present. The pericardial effusion is  ?posterior and lateral to the left  ventricle.  ? ?Patient Profile  ?Rodney [name] is a 71 y.o. male with COPD, severe stenosis, heart failure with reduced ejection fraction with recovery of ejection fraction, PAD status post left BKA, persistent atrial fibrillation on Coumadin, CKD stage III, CAD, diabetes who was admitted on 08/19/2021 for acute on chronic respiratory failure secondary COPD.  Cardiology was consulted for severe aortic stenosis. ? ?  Assessment & Plan  ? ?#Severe AS ?-Aortic stenosis has progressed.  Evaluated by TAVR team.  Plan for right and left heart catheterization today.  Anatomy on CT is notable for 29 mm S3. ?-Risk and benefits of left and right heart catheterization explained.  He is willing to proceed. ? ?Shared Decision Making/Informed Consent ?The risks [stroke (1 in 1000), death (1 in 37), kidney failure [usually temporary] (1 in 500), bleeding (1 in 200), allergic reaction [possibly serious] (1 in 200)], benefits (diagnostic support and management of coronary artery disease) and alternatives of a cardiac catheterization were discussed in detail with Mr. Neidert and he is willing to proceed. ? ? ?#Systolic heart failure with recovered ejection fraction, 55-60% ?-EF has recovered with medical therapy.  We will continue this for now. ?-Euvolemic on examination today. ?-Continue carvedilol. ?-Would likely add back an ARB as we are able pending kidney function. ? ?#PAD ?-Status post left BKA ?-Status post right femoropopliteal bypass with recent R SFA stent @ prior anastomosis of bypass graft (05/12/2021) ?-Status post right great toe amputation ?- we will need second right toe amputation per vascular surgery ? ?#Persistent atrial fibrillation ?-Cannot afford Eliquis.  On home Coumadin.  On heparin drip right now due to need for procedures. ?-EKG and telemetry appear to represent atrial fibrillation.  Would recommend continue with rate control strategy. ? ?#CKD stage III ?-Stable.  Plan to add ARB back at some  point. ? ?#CAD ?-Known history of CTO of the RCA ?-On aspirin, statin and beta-blocker.  No symptoms of angina. ?-Left heart catheterization today for TAVR work-up. ? ?#COPD ?-stable    ? ?For questions or updates, ple

## 2021-08-25 NOTE — Progress Notes (Addendum)
ANTICOAGULATION CONSULT NOTE - Follow-Up Consult ? ?Pharmacy Consult for warfarin to IV heparin  ?Indication: atrial fibrillation ? ?No Known Allergies ? ?Patient Measurements: ?Height: 6' (182.9 cm) ?Weight: 95.8 kg (211 lb 3.2 oz) ?IBW/kg (Calculated) : 77.6 ?Heparin dosing weight: 96 kg ? ?Vital Signs: ?Temp: 97.6 ?F (36.4 ?C) (05/01 0602) ?Temp Source: Oral (05/01 0602) ?BP: 140/69 (05/01 0602) ?Pulse Rate: 75 (05/01 0602) ? ?Labs: ?Recent Labs  ?  08/23/21 ?5726 08/24/21 ?0229 08/25/21 ?0134  ?HGB 8.7* 8.4* 8.6*  ?HCT 26.8* 26.0* 26.3*  ?PLT 215 186 193  ?LABPROT 18.9* 15.2 15.4*  ?INR 1.6* 1.2 1.2  ?HEPARINUNFRC 0.43 0.56 0.37  ?CREATININE 1.39* 1.33* 1.45*  ? ? ? ?Estimated Creatinine Clearance: 57.7 mL/min (A) (by C-G formula based on SCr of 1.45 mg/dL (H)). ? ? ?Medical History: ?Past Medical History:  ?Diagnosis Date  ? Acute combined systolic and diastolic congestive heart failure ([place])   ? Acute on chronic heart failure with preserved ejection fraction (HFpEF) ([place])   ? Acute respiratory failure with hypoxia ([place])   ? Acute respiratory failure with hypoxia ([place])   ? Aortic stenosis   ? moderate in 2022  ? Atrial fibrillation ([place])   ? CHF (congestive heart failure) ([place])   ? Chronic pleural effusion, Left 11/16/2020  ? COPD exacerbation ([place]) 12/09/2020  ? Coronary artery disease   ? Demand ischemia ([place]) 03/26/2021  ? Diabetes mellitus without complication ([place])   ? HLD (hyperlipidemia)   ? Hypertension   ? Long term (current) use of anticoagulants 12/29/2019  ? Malnutrition of moderate degree 05/09/2021  ? Peripheral arterial disease ([place])   ? Pneumonia due to COVID-19 virus 05/29/2021  ? ? ?Assessment: ?71 yo M presenting with dyspnea 2/2 COPD exacerbation. PMH significant for afib on PTA warfarin 2.'5mg'$  daily. Pharmacy consulted for warfarin dosing - now on hold. R/LHC planned for today - pharmacy consulted to transition to IV heparin. ? ?INR 1.2  ?Heparin level: 0.37, therapeutic ?Current heparin infusion  rate: 1500 units/hr ?Hemoglobin low but stable 8.6, platelets normal ?No reported s/sx of bleeding ? ?Goal of Therapy:  ?Heparin level 0.3-0.7 units/ml ?INR 2-2.5 (decreased due to normocytic anemia in past) ?Monitor platelets by anticoagulation protocol: Yes ?  ?Plan:  ?Continue heparin infusion at 1500 units/hr  ?Continue to hold warfarin for cath ?Daily heparin level and CBC ?Monitor for s/s of bleeding ?Plan for [facility] today 08/25/21  ? ?Erin Hearing PharmD., BCPS ?Clinical Pharmacist ?08/25/2021 7:45 AM ? ?

## 2021-08-26 ENCOUNTER — Encounter (HOSPITAL_COMMUNITY)
Admission: EM | Disposition: A | Discharge status: Home / Self Care | Payer: Self-pay | Source: Home / Self Care | Attending: Family Medicine

## 2021-08-26 ENCOUNTER — Inpatient Hospital Stay (HOSPITAL_COMMUNITY): Payer: Medicare PPO | Admitting: Anesthesiology

## 2021-08-26 ENCOUNTER — Encounter (HOSPITAL_COMMUNITY): Payer: Self-pay | Admitting: Cardiovascular Disease

## 2021-08-26 DIAGNOSIS — J441 Chronic obstructive pulmonary disease with (acute) exacerbation: Secondary | ICD-10-CM | POA: Diagnosis not present

## 2021-08-26 DIAGNOSIS — N179 Acute kidney failure, unspecified: Secondary | ICD-10-CM | POA: Diagnosis not present

## 2021-08-26 DIAGNOSIS — I5032 Chronic diastolic (congestive) heart failure: Secondary | ICD-10-CM | POA: Diagnosis not present

## 2021-08-26 DIAGNOSIS — J9621 Acute and chronic respiratory failure with hypoxia: Secondary | ICD-10-CM | POA: Diagnosis not present

## 2021-08-26 DIAGNOSIS — J9622 Acute and chronic respiratory failure with hypercapnia: Secondary | ICD-10-CM | POA: Diagnosis not present

## 2021-08-26 DIAGNOSIS — I4891 Unspecified atrial fibrillation: Secondary | ICD-10-CM

## 2021-08-26 DIAGNOSIS — J449 Chronic obstructive pulmonary disease, unspecified: Secondary | ICD-10-CM | POA: Diagnosis not present

## 2021-08-26 DIAGNOSIS — I96 Gangrene, not elsewhere classified: Secondary | ICD-10-CM | POA: Diagnosis not present

## 2021-08-26 DIAGNOSIS — E1152 Type 2 diabetes mellitus with diabetic peripheral angiopathy with gangrene: Secondary | ICD-10-CM

## 2021-08-26 DIAGNOSIS — Z7984 Long term (current) use of oral hypoglycemic drugs: Secondary | ICD-10-CM

## 2021-08-26 DIAGNOSIS — Z794 Long term (current) use of insulin: Secondary | ICD-10-CM

## 2021-08-26 DIAGNOSIS — I480 Paroxysmal atrial fibrillation: Secondary | ICD-10-CM | POA: Diagnosis not present

## 2021-08-26 DIAGNOSIS — I350 Nonrheumatic aortic (valve) stenosis: Secondary | ICD-10-CM | POA: Diagnosis not present

## 2021-08-26 HISTORY — PX: AMPUTATION: SHX166

## 2021-08-26 LAB — CBC
HCT: 27.3 % — ABNORMAL LOW (ref 39.0–52.0)
Hemoglobin: 8.8 g/dL — ABNORMAL LOW (ref 13.0–17.0)
MCH: 29.2 pg (ref 26.0–34.0)
MCHC: 32.2 g/dL (ref 30.0–36.0)
MCV: 90.7 fL (ref 80.0–100.0)
Platelets: 181 10*3/uL (ref 150–400)
RBC: 3.01 MIL/uL — ABNORMAL LOW (ref 4.22–5.81)
RDW: 15.6 % — ABNORMAL HIGH (ref 11.5–15.5)
WBC: 6.9 10*3/uL (ref 4.0–10.5)
nRBC: 0 % (ref 0.0–0.2)

## 2021-08-26 LAB — GLUCOSE, CAPILLARY
Glucose-Capillary: 150 mg/dL — ABNORMAL HIGH (ref 70–99)
Glucose-Capillary: 100 mg/dL — ABNORMAL HIGH (ref 70–99)
Glucose-Capillary: 96 mg/dL (ref 70–99)
Glucose-Capillary: 120 mg/dL — ABNORMAL HIGH (ref 70–99)

## 2021-08-26 LAB — BASIC METABOLIC PANEL
Anion gap: 7 (ref 5–15)
BUN: 29 mg/dL — ABNORMAL HIGH (ref 8–23)
CO2: 32 mmol/L (ref 22–32)
Calcium: 8 mg/dL — ABNORMAL LOW (ref 8.9–10.3)
Chloride: 95 mmol/L — ABNORMAL LOW (ref 98–111)
Creatinine, Ser: 1.36 mg/dL — ABNORMAL HIGH (ref 0.61–1.24)
GFR, Estimated: 56 mL/min — ABNORMAL LOW (ref >60)
Glucose, Bld: 169 mg/dL — ABNORMAL HIGH (ref 70–99)
Potassium: 3.3 mmol/L — ABNORMAL LOW (ref 3.5–5.1)
Sodium: 134 mmol/L — ABNORMAL LOW (ref 135–145)

## 2021-08-26 LAB — PROTIME-INR
INR: 1.1 (ref 0.8–1.2)
Prothrombin Time: 13.6 seconds (ref 11.4–15.2)

## 2021-08-26 SURGERY — AMPUTATION DIGIT
Anesthesia: Monitor Anesthesia Care | Site: Toe | Laterality: Right

## 2021-08-26 MED ORDER — HEPARIN (PORCINE) 25000 UT/250ML-% IV SOLN
1600.0000 [IU]/h | INTRAVENOUS | Status: DC
  Administered 2021-08-26: 1500 [IU]/h via INTRAVENOUS
  Administered 2021-08-27: 1600 [IU]/h via INTRAVENOUS
  Filled 2021-08-26 (×5): qty 250

## 2021-08-26 MED ORDER — MIDAZOLAM HCL 2 MG/2ML IJ SOLN
INTRAMUSCULAR | Status: AC
  Filled 2021-08-26: qty 2

## 2021-08-26 MED ORDER — LIDOCAINE HCL (PF) 1 % IJ SOLN
INTRAMUSCULAR | Status: DC | PRN
  Administered 2021-08-26: 13 mL

## 2021-08-26 MED ORDER — FENTANYL CITRATE (PF) 100 MCG/2ML IJ SOLN: 25.0000 ug | INTRAMUSCULAR | Status: DC | PRN

## 2021-08-26 MED ORDER — LIDOCAINE HCL (PF) 1 % IJ SOLN
INTRAMUSCULAR | Status: AC
  Filled 2021-08-26: qty 30

## 2021-08-26 MED ORDER — LACTATED RINGERS IV SOLN: INTRAVENOUS | Status: DC

## 2021-08-26 MED ORDER — LACTATED RINGERS IV SOLN: INTRAVENOUS | Status: DC | PRN

## 2021-08-26 MED ORDER — ORAL CARE MOUTH RINSE: 15.0000 mL | Freq: Once | OROMUCOSAL | Status: DC

## 2021-08-26 MED ORDER — CEFAZOLIN SODIUM-DEXTROSE 2-3 GM-%(50ML) IV SOLR
INTRAVENOUS | Status: DC | PRN
  Administered 2021-08-26: 2 g via INTRAVENOUS

## 2021-08-26 MED ORDER — MUPIROCIN 2 % EX OINT: TOPICAL_OINTMENT | Freq: Two times a day (BID) | CUTANEOUS | Status: DC

## 2021-08-26 MED ORDER — MUPIROCIN 2 % EX OINT
1.0000 "application " | TOPICAL_OINTMENT | Freq: Two times a day (BID) | CUTANEOUS | Status: AC
  Administered 2021-08-26 – 2021-08-30 (×10): 1 via NASAL
  Filled 2021-08-26: qty 22

## 2021-08-26 MED ORDER — CHLORHEXIDINE GLUCONATE CLOTH 2 % EX PADS
6.0000 | MEDICATED_PAD | Freq: Every day | CUTANEOUS | Status: AC
  Administered 2021-08-26 – 2021-08-29 (×4): 6 via TOPICAL

## 2021-08-26 MED ORDER — LIDOCAINE 2% (20 MG/ML) 5 ML SYRINGE
INTRAMUSCULAR | Status: DC | PRN
  Administered 2021-08-26: 50 mg via INTRAVENOUS

## 2021-08-26 MED ORDER — FENTANYL CITRATE (PF) 250 MCG/5ML IJ SOLN
INTRAMUSCULAR | Status: AC
  Filled 2021-08-26: qty 5

## 2021-08-26 MED ORDER — EPHEDRINE SULFATE-NACL 50-0.9 MG/10ML-% IV SOSY
PREFILLED_SYRINGE | INTRAVENOUS | Status: DC | PRN
  Administered 2021-08-26: 5 mg via INTRAVENOUS

## 2021-08-26 MED ORDER — POTASSIUM CHLORIDE CRYS ER 20 MEQ PO TBCR
30.0000 meq | EXTENDED_RELEASE_TABLET | Freq: Two times a day (BID) | ORAL | Status: AC
  Administered 2021-08-26 (×2): 30 meq via ORAL
  Filled 2021-08-26 (×2): qty 1

## 2021-08-26 MED ORDER — SODIUM CHLORIDE (PF) 0.9 % IJ SOLN
INTRAMUSCULAR | Status: AC
  Filled 2021-08-26: qty 10

## 2021-08-26 MED ORDER — ONDANSETRON HCL 4 MG/2ML IJ SOLN: 4.0000 mg | Freq: Once | INTRAMUSCULAR | Status: DC | PRN

## 2021-08-26 MED ORDER — ONDANSETRON HCL 4 MG/2ML IJ SOLN
INTRAMUSCULAR | Status: AC
  Filled 2021-08-26: qty 2

## 2021-08-26 MED ORDER — 0.9 % SODIUM CHLORIDE (POUR BTL) OPTIME
TOPICAL | Status: DC | PRN
  Administered 2021-08-26: 1000 mL

## 2021-08-26 MED ORDER — DEXAMETHASONE SODIUM PHOSPHATE 10 MG/ML IJ SOLN
INTRAMUSCULAR | Status: AC
  Filled 2021-08-26: qty 1

## 2021-08-26 MED ORDER — PROPOFOL 10 MG/ML IV BOLUS
INTRAVENOUS | Status: AC
  Filled 2021-08-26: qty 20

## 2021-08-26 MED ORDER — CHLORHEXIDINE GLUCONATE 0.12 % MT SOLN
OROMUCOSAL | Status: AC
Start: 2021-08-26 — End: 2021-08-26
  Administered 2021-08-26: 15 mL via OROMUCOSAL
  Filled 2021-08-26: qty 15

## 2021-08-26 MED ORDER — ORAL CARE MOUTH RINSE: 15.0000 mL | Freq: Once | OROMUCOSAL | Status: AC

## 2021-08-26 MED ORDER — ACETAMINOPHEN 500 MG PO TABS
1000.0000 mg | ORAL_TABLET | Freq: Once | ORAL | Status: AC
  Administered 2021-08-26: 1000 mg via ORAL
  Filled 2021-08-26: qty 2

## 2021-08-26 MED ORDER — PROPOFOL 500 MG/50ML IV EMUL
INTRAVENOUS | Status: DC | PRN
  Administered 2021-08-26: 70 ug/kg/min via INTRAVENOUS

## 2021-08-26 MED ORDER — CHLORHEXIDINE GLUCONATE 0.12 % MT SOLN: 15.0000 mL | Freq: Once | OROMUCOSAL | Status: DC

## 2021-08-26 MED ORDER — POTASSIUM CHLORIDE 10 MEQ/100ML IV SOLN
10.0000 meq | INTRAVENOUS | Status: AC
  Administered 2021-08-26 (×2): 10 meq via INTRAVENOUS
  Filled 2021-08-26 (×2): qty 100

## 2021-08-26 MED ORDER — CHLORHEXIDINE GLUCONATE 0.12 % MT SOLN: 15.0000 mL | Freq: Once | OROMUCOSAL | Status: AC

## 2021-08-26 MED ORDER — LIDOCAINE 2% (20 MG/ML) 5 ML SYRINGE
INTRAMUSCULAR | Status: AC
  Filled 2021-08-26: qty 5

## 2021-08-26 SURGICAL SUPPLY — 30 items
BAG COUNTER SPONGE SURGICOUNT (BAG) ×2 IMPLANT
BAG SPNG CNTER NS LX DISP (BAG) ×1
BNDG ELASTIC 4X5.8 VLCR STR LF (GAUZE/BANDAGES/DRESSINGS) ×1 IMPLANT
BNDG GAUZE ELAST 4 BULKY (GAUZE/BANDAGES/DRESSINGS) ×1 IMPLANT
CANISTER SUCT 3000ML PPV (MISCELLANEOUS) ×2 IMPLANT
COVER SURGICAL LIGHT HANDLE (MISCELLANEOUS) ×2 IMPLANT
DRAPE EXTREMITY T 121X128X90 (DISPOSABLE) ×2 IMPLANT
DRAPE HALF SHEET 40X57 (DRAPES) ×1 IMPLANT
DRSG EMULSION OIL 3X3 NADH (GAUZE/BANDAGES/DRESSINGS) ×1 IMPLANT
ELECT REM PT RETURN 9FT ADLT (ELECTROSURGICAL) ×2
ELECTRODE REM PT RTRN 9FT ADLT (ELECTROSURGICAL) ×1 IMPLANT
GAUZE SPONGE 4X4 12PLY STRL (GAUZE/BANDAGES/DRESSINGS) ×2 IMPLANT
GLOVE BIO SURGEON STRL SZ 6.5 (GLOVE) ×1 IMPLANT
GLOVE BIO SURGEON STRL SZ7.5 (GLOVE) ×2 IMPLANT
GOWN STRL REUS W/ TWL LRG LVL3 (GOWN DISPOSABLE) ×2 IMPLANT
GOWN STRL REUS W/ TWL XL LVL3 (GOWN DISPOSABLE) ×1 IMPLANT
GOWN STRL REUS W/TWL LRG LVL3 (GOWN DISPOSABLE) ×4
GOWN STRL REUS W/TWL XL LVL3 (GOWN DISPOSABLE) ×2
KIT BASIN OR (CUSTOM PROCEDURE TRAY) ×2 IMPLANT
KIT TURNOVER KIT B (KITS) ×2 IMPLANT
NDL HYPO 25GX1X1/2 BEV (NEEDLE) IMPLANT
NEEDLE HYPO 25GX1X1/2 BEV (NEEDLE) ×2 IMPLANT
NS IRRIG 1000ML POUR BTL (IV SOLUTION) ×2 IMPLANT
PACK GENERAL/GYN (CUSTOM PROCEDURE TRAY) ×2 IMPLANT
PAD ARMBOARD 7.5X6 YLW CONV (MISCELLANEOUS) ×4 IMPLANT
SUT ETHILON 3 0 PS 1 (SUTURE) ×2 IMPLANT
SYR CONTROL 10ML LL (SYRINGE) ×1 IMPLANT
TOWEL GREEN STERILE (TOWEL DISPOSABLE) ×4 IMPLANT
UNDERPAD 30X36 HEAVY ABSORB (UNDERPADS AND DIAPERS) ×2 IMPLANT
WATER STERILE IRR 1000ML POUR (IV SOLUTION) ×2 IMPLANT

## 2021-08-26 NOTE — Progress Notes (Signed)
OT Cancellation Note ? ?Patient Details ?Name: Rodney [name] ?MRN: [id number] ?DOB: 1951/01/05 ? ? ?Cancelled Treatment:    Reason Eval/Treat Not Completed: Other (comment) (pt off floor for procedure, OT will re-attempt as able.) ? ?Shanon Payor, OTD OTR/L  ?08/26/21, 12:05 PM  ?

## 2021-08-26 NOTE — Plan of Care (Signed)
?  Problem: Education: ?Goal: Knowledge of disease or condition will improve ?Outcome: Progressing ?Goal: Knowledge of the prescribed therapeutic regimen will improve ?Outcome: Progressing ?  ?Problem: Activity: ?Goal: Will verbalize the importance of balancing activity with adequate rest periods ?Outcome: Progressing ?  ?Problem: Respiratory: ?Goal: Ability to maintain a clear airway will improve ?Outcome: Progressing ?Goal: Levels of oxygenation will improve ?Outcome: Progressing ?Goal: Ability to maintain adequate ventilation will improve ?Outcome: Progressing ?  ?

## 2021-08-26 NOTE — Anesthesia Preprocedure Evaluation (Addendum)
Anesthesia Evaluation  ?Patient identified by MRN, date of birth, ID band ?Patient awake ? ? ? ?Reviewed: ?Allergy & Precautions, NPO status , Patient's Chart, lab work & pertinent test results, reviewed documented beta blocker date and time  ? ?Airway ?Mallampati: II ? ?TM Distance: >3 FB ?Neck ROM: Full ? ? ? Dental ? ?(+) Dental Advisory Given, Poor Dentition, Chipped, Missing ?  ?Pulmonary ?COPD,  COPD inhaler and oxygen dependent, Patient abstained from smoking., former smoker,  ?  ?Pulmonary exam normal ?breath sounds clear to auscultation ? ? ? ? ? ? Cardiovascular ?hypertension, Pt. on home beta blockers ?+ CAD, + Peripheral Vascular Disease and +CHF  ?+ dysrhythmias Atrial Fibrillation + Valvular Problems/Murmurs AS  ?Rhythm:Regular Rate:Normal ?+ Systolic murmurs ?Echo 06/23/21: ?1. Left ventricular ejection fraction, by estimation, is 55 to 60%. The left ventricle has normal function. The left ventricle has no regional wall motion abnormalities. There is mild concentric left ventricular hypertrophy. Left ventricular diastolic parameters are indeterminate.  ??2. Right ventricular systolic function is normal. The right ventricular size is normal. There is mildly elevated pulmonary artery systolic pressure.  ??3. Left atrial size was mildly dilated.  ??4. Right atrial size was mildly dilated.  ??5. The mitral valve is grossly normal. Mild mitral valve regurgitation. Moderate mitral annular calcification.  ??6. The aortic valve is calcified. There is severe calcifcation of the aortic valve. There is severe thickening of the aortic valve. Aortic valve regurgitation is trivial. Severe aortic valve stenosis. Aortic valve area, by VTI measures 0.75 cm?Marland Kitchen Aortic valve mean gradient measures 43.0 mmHg. Aortic valve Vmax measures 4.30  ?m/s. DI 0.22  ??7. Aortic dilatation noted. There is borderline dilatation of the aortic root, measuring 36 mm. There is mild dilatation of the  ascending aorta, measuring 38 mm.  ??8. The inferior vena cava is dilated in size with <50% respiratory variability, suggesting right atrial pressure of 15 mmHg.  ??9. A small pericardial effusion is present. The pericardial effusion is posterior and lateral to the left ventricle.  ?  ?Neuro/Psych ? Neuromuscular disease   ? GI/Hepatic ?Neg liver ROS, GERD  Medicated,  ?Endo/Other  ?diabetes, Type 2, Insulin Dependent, Oral Hypoglycemic Agents ? Renal/GU ?Renal InsufficiencyRenal disease  ? ?  ?Musculoskeletal ?negative musculoskeletal ROS ?(+)  ? Abdominal ?  ?Peds ? Hematology ? ?(+) Blood dyscrasia (Plavix; Warfarin), anemia ,   ?Anesthesia Other Findings ? ? Reproductive/Obstetrics ? ?  ? ? ? ? ? ? ? ? ? ? ? ? ? ?  ?  ? ? ? ? ? ? ?Anesthesia Physical ?Anesthesia Plan ? ?ASA: 4 ? ?Anesthesia Plan: MAC  ? ?Post-op Pain Management: Tylenol PO (pre-op)*  ? ?Induction: Intravenous ? ?PONV Risk Score and Plan: 1 and Propofol infusion and Treatment may vary due to age or medical condition ? ?Airway Management Planned: Simple Face Mask ? ?Additional Equipment:  ? ?Intra-op Plan:  ? ?Post-operative Plan:  ? ?Informed Consent: I have reviewed the patients History and Physical, chart, labs and discussed the procedure including the risks, benefits and alternatives for the proposed anesthesia with the patient or authorized representative who has indicated his/her understanding and acceptance.  ? ? ? ?Dental advisory given ? ?Plan Discussed with: CRNA ? ?Anesthesia Plan Comments:   ? ? ? ? ? ?Anesthesia Quick Evaluation ? ?

## 2021-08-26 NOTE — Consult Note (Signed)
?[facility]   ?MULTIDISCIPLINARY HEART VALVE CLINIC ?  ? ? ? ?   ?[address] ?      [place] [address] ?            [phone]   ?      ? ?CARDIOTHORACIC SURGERY CONSULTATION REPORT ? ?PCP is [name], MD ?Referring Provider is Sherren Mocha, MD ?Primary Cardiologist is Evalina Field, MD ? ?Reason for consultation:  Severe aortic stenosis ? ?HPI: ? ?The patient is a 71 year old gentleman with a history of hypertension, hyperlipidemia, three-vessel coronary artery disease with known CTO of the RCA, HFpEF, peripheral arterial disease status post left BKA and right lower extremity ischemia treated with femoropopliteal bypass 1 year ago with subsequent impending bypass failure and stenting of the bypass and amputation of the right second toe today for gangrene, severe COPD now on home oxygen with multiple recent readmissions for chronic respiratory failure, persistent atrial fibrillation/flutter, stage III chronic kidney disease, and severe aortic stenosis who was referred for consideration of transcatheter aortic valve replacement.  His most recent echo on 06/23/2021 showed a severely calcified and thickened aortic valve with restricted leaflet mobility.  The mean gradient was 43 mmHg with a peak gradient of 74 mmHg.  Valve area by VTI was 0.82 cm? with a dimensionless index of 0.23.  Left ventricular ejection fraction was 55 to 60% with mild concentric LVH.  He was admitted on 07/20/2021 with acute hypoxic respiratory failure in the setting of COVID-pneumonia along with chronic diastolic congestive heart failure.  He was discharged on Lasix and medical therapy with plans for outpatient referral to the structural heart clinic once he recovered from his COVID and other vascular issues. He then presented to the ED on 08/19/2021 with complaints of worsening shortness of breath that occurred while sitting on a couch watching TV.  He required BiPAP on EMS arrival.  He improved  with BiPAP and was weaned to nasal cannula.  He was treated with antibiotics, steroids, and bronchodilator nebs on the medical service.  Cardiology was consulted.  He underwent cardiac catheterization yesterday showing multivessel coronary disease with severe coronary calcification.  His disease did not appear any worse than on his previous catheterization.  He has a known CTO of the mid RCA.  There is 60% proximal to mid left circumflex and 60% first marginal stenosis.  The LAD has 50% mid vessel stenosis.  The mean gradient across the aortic valve was measured at 34 mmHg with a peak to peak gradient of 36 mmHg.  PA pressure was 52/16 with a mean of 35.  Mean wedge pressure was 21. ? ?Past Medical History:  ?Diagnosis Date  ? Acute combined systolic and diastolic congestive heart failure ([place])   ? Acute on chronic heart failure with preserved ejection fraction (HFpEF) ([place])   ? Acute respiratory failure with hypoxia ([place])   ? Acute respiratory failure with hypoxia ([place])   ? Aortic stenosis   ? moderate in 2022  ? Atrial fibrillation ([place])   ? CHF (congestive heart failure) ([place])   ? Chronic pleural effusion, Left 11/16/2020  ? COPD exacerbation ([place]) 12/09/2020  ? Coronary artery disease   ? Demand ischemia ([place]) 03/26/2021  ? Diabetes mellitus without complication ([place])   ? HLD (hyperlipidemia)   ? Hypertension   ? Long term (current) use of anticoagulants 12/29/2019  ? Malnutrition of moderate degree 05/09/2021  ? Peripheral arterial disease ([place])   ? Pneumonia due  to COVID-19 virus 05/29/2021  ? ? ?Past Surgical History:  ?Procedure Laterality Date  ? ABDOMINAL AORTOGRAM W/LOWER EXTREMITY N/A 08/05/2020  ? Procedure: ABDOMINAL AORTOGRAM W/LOWER EXTREMITY;  Surgeon: Marty Heck, MD;  Location: [facility];  Service: Cardiovascular;  Laterality: N/A;  ? ABDOMINAL AORTOGRAM W/LOWER EXTREMITY N/A 11/13/2020  ? Procedure: ABDOMINAL AORTOGRAM W/LOWER EXTREMITY;  Surgeon: Cherre Robins, MD;  Location:  [facility];  Service: Cardiovascular;  Laterality: N/A;  ? ABDOMINAL AORTOGRAM W/LOWER EXTREMITY N/A 05/12/2021  ? Procedure: ABDOMINAL AORTOGRAM W/LOWER EXTREMITY;  Surgeon: Waynetta Sandy, MD;  Location: [facility];  Service: Cardiovascular;  Laterality: N/A;  ? AMPUTATION Left 09/28/2019  ? Procedure: AMPUTATION BELOW KNEE;  Surgeon: Newt Minion, MD;  Location: [facility];  Service: Orthopedics;  Laterality: Left;  ? AMPUTATION Right 11/15/2020  ? Procedure: RIGHT GREAT TOE AMPUTATION;  Surgeon: Newt Minion, MD;  Location: [facility];  Service: Orthopedics;  Laterality: Right;  ? CARDIAC CATHETERIZATION    ? CARDIOVERSION N/A 10/05/2019  ? Procedure: CARDIOVERSION;  Surgeon: Sanda Klein, MD;  Location: [facility];  Service: Cardiovascular;  Laterality: N/A;  ? FEMORAL-POPLITEAL BYPASS GRAFT Right 08/07/2020  ? Procedure: RIGHT FEMORAL TO BELOW KNEE POPLITEAL ARTERY BYPASS;  Surgeon: Waynetta Sandy, MD;  Location: [facility];  Service: Vascular;  Laterality: Right;  ? LEFT HEART CATH AND CORONARY ANGIOGRAPHY N/A 10/03/2019  ? Procedure: LEFT HEART CATH AND CORONARY ANGIOGRAPHY;  Surgeon: Lorretta Harp, MD;  Location: [facility];  Service: Cardiovascular;  Laterality: N/A;  ? PERIPHERAL VASCULAR INTERVENTION Right 08/05/2020  ? Procedure: PERIPHERAL VASCULAR INTERVENTION;  Surgeon: Marty Heck, MD;  Location: [facility];  Service: Cardiovascular;  Laterality: Right;  common Iliac  ? PERIPHERAL VASCULAR INTERVENTION Left 11/13/2020  ? Procedure: PERIPHERAL VASCULAR INTERVENTION;  Surgeon: Cherre Robins, MD;  Location: [facility];  Service: Cardiovascular;  Laterality: Left;  ? PERIPHERAL VASCULAR INTERVENTION Right 11/14/2020  ? Procedure: PERIPHERAL VASCULAR INTERVENTION;  Surgeon: Marty Heck, MD;  Location: [facility];  Service: Cardiovascular;  Laterality: Right;  POP/SFA STENT  ? PERIPHERAL VASCULAR INTERVENTION  05/12/2021  ?  Procedure: PERIPHERAL VASCULAR INTERVENTION;  Surgeon: Waynetta Sandy, MD;  Location: [facility];  Service: Cardiovascular;;  ? RIGHT/LEFT HEART CATH AND CORONARY ANGIOGRAPHY N/A 08/25/2021  ? Procedure: RIGHT/LEFT HEART CATH AND CORONARY ANGIOGRAPHY;  Surgeon: Troy Sine, MD;  Location: [facility];  Service: Cardiovascular;  Laterality: N/A;  ? TEE WITHOUT CARDIOVERSION N/A 10/05/2019  ? Procedure: TRANSESOPHAGEAL ECHOCARDIOGRAM (TEE);  Surgeon: Sanda Klein, MD;  Location: [facility];  Service: Cardiovascular;  Laterality: N/A;  ? ? ?Family History  ?Problem Relation Age of Onset  ? Alcoholism Mother   ? Alcoholism Father   ? ? ?Social History  ? ?Socioeconomic History  ? Marital status: Widowed  ?  Spouse name: Not on file  ? Number of children: Not on file  ? Years of education: Not on file  ? Highest education level: Not on file  ?Occupational History  ? Not on file  ?Tobacco Use  ? Smoking status: Former  ?  Packs/day: 0.50  ?  Years: 50.00  ?  Pack years: 25.00  ?  Types: Cigarettes  ?  Start date: 04/09/2021  ? Smokeless tobacco: Never  ?Vaping Use  ? Vaping Use: Never used  ?Substance and Sexual Activity  ? Alcohol use: Yes  ?  Alcohol/week: 6.0 standard drinks  ?  Types: 6 Standard drinks or equivalent per week  ?  Comment: 11/07/20 - states he has not drank in 6 months  ? Drug use: No  ? Sexual activity: Yes  ?  Partners: Female  ?  Comment: monagamous stable relationship  ?Other Topics Concern  ? Not on file  ?Social History Narrative  ? Not on file  ? ?Social Determinants of Health  ? ?Financial Resource Strain: Not on file  ?Food Insecurity: Not on file  ?Transportation Needs: Unmet Transportation Needs  ? Lack of Transportation (Medical): Yes  ? Lack of Transportation (Non-Medical): No  ?Physical Activity: Not on file  ?Stress: Not on file  ?Social Connections: Not on file  ?Intimate Partner Violence: Not on file  ? ? ?Prior to Admission medications   ?Medication Sig  Start Date End Date Taking? Authorizing Provider  ?acetaminophen (TYLENOL) 500 MG tablet Take 1,000 mg by mouth every 6 (six) hours as needed for mild pain.   Yes [provider]  ?albuterol (VENTOL

## 2021-08-26 NOTE — Progress Notes (Addendum)
? ? ?[facility]   ?MULTIDISCIPLINARY HEART VALVE TEAM ? ?Patient Name: Rodney [name] ?Date of Encounter: 08/26/2021 ? ?Primary Cardiologist: Evalina Field, MD    ? ?Hospital Problem List  ?   ?Principal Problem: ?  Acute on chronic respiratory failure with hypoxia and hypercapnia (HCC) ?Active Problems: ?  Hypertension associated with chronic kidney disease due to type 2 diabetes mellitus ([place]) ?  DM (diabetes mellitus), type 2 with neurological complications ([place]) ?  HFrEF (heart failure with reduced ejection fraction) ([place]) ?  Severe aortic stenosis ?  AF (paroxysmal atrial fibrillation) ([place]) ?  Hx of BKA, left ([place]) ?  CAD (coronary artery disease) ?  AKI (acute kidney injury) ([place]) ?  Chronic respiratory failure with hypoxia and hypercapnia (HCC) ?  Acute kidney injury superimposed on chronic kidney disease ([place]) ?  Chronic pleural effusion, Left ?  COPD exacerbation ([place]) ?  History of multiple blood transfusions ?  History of amputation of right great toe ([place]) ?  Emphysema of lung ([place]) ?  Atherosclerosis of aorta ([place]) ?  Pleural effusion on right ?  Chronic blood loss anemia ?  ?Subjective  ? ?Patient with no physical complaints today. Plan for right toe amputation today. Wants to go home. Discussed TAVR plan.  ? ?Inpatient Medications  ?  ?Scheduled Meds: ? acetaminophen  1,000 mg Oral Once  ? atorvastatin  80 mg Oral Daily  ? carvedilol  6.25 mg Oral BID WC  ? Chlorhexidine Gluconate Cloth  6 each Topical Q0600  ? clopidogrel  75 mg Oral Q breakfast  ? fidaxomicin  200 mg Oral BID  ? fluticasone furoate-vilanterol  1 puff Inhalation Daily  ? furosemide  40 mg Oral Daily  ? insulin aspart  0-20 Units Subcutaneous TID WC  ? insulin detemir  15 Units Subcutaneous QHS  ? ipratropium-albuterol  3 mL Nebulization BID  ? mupirocin ointment  1 application. Nasal BID  ? pantoprazole  40 mg Oral BID  ? sodium chloride flush  3 mL Intravenous Q12H  ? sodium chloride flush  3 mL Intravenous Q12H   ? umeclidinium bromide  1 puff Inhalation Daily  ? ?Continuous Infusions: ? sodium chloride    ? ?PRN Meds: ?sodium chloride, acetaminophen, ipratropium-albuterol, ondansetron (ZOFRAN) IV, sodium chloride flush, traZODone  ? ?Vital Signs  ?  ?Vitals:  ? 08/26/21 7353 08/26/21 2992 08/26/21 0729 08/26/21 4268  ?BP: 130/67 129/69  (!) 141/67  ?Pulse: 75 75  73  ?Resp: _0 ?Temp: 98.1 ?F (36.7 ?C) 98.2 ?F (36.8 ?C)  97.8 ?F (36.6 ?C)  ?TempSrc: Oral Oral  Oral  ?SpO2: 100% 100% 100% 100%  ?Weight:  90.5 kg    ?Height:      ? ? ?Intake/Output Summary (Last 24 hours) at 08/26/2021 1001 ?Last data filed at 08/26/2021 0700 ?Gross per 24 hour  ?Intake 2162.58 ml  ?Output 3625 ml  ?Net -1462.42 ml  ? ?Filed Weights  ? 08/24/21 0107 08/25/21 0029 08/26/21 0432  ?Weight: 96.8 kg 95.8 kg 90.5 kg  ? ?Physical Exam  ? ?General: Ill appearing, NAD ?Lungs:Diminished in bilateral upper and lower lobes. Breathing is unlabored. ?Cardiovascular:Irregular. Harsh, systolic murmur ?Extremities: s/p left BLA with right toe amputation and plan for second toe (R) today ?Neuro: Alert and oriented. No focal deficits. No facial asymmetry. MAE spontaneously. ?Psych: Responds to questions appropriately with normal affect.   ? ?Labs  ?  ?CBC ?Recent Labs  ?  08/25/21 ?0134  08/25/21 ?1508 08/25/21 ?1518 08/26/21 ?0450  ?WBC 8.4  --   --  6.9  ?HGB 8.6*   < > 9.5* 8.8*  ?HCT 26.3*   < > 28.0* 27.3*  ?MCV 91.0  --   --  90.7  ?PLT 193  --   --  181  ? < > = values in this interval not displayed.  ? ?Basic Metabolic Panel ?Recent Labs  ?  08/25/21 ?0134 08/25/21 ?1508 08/25/21 ?1518 08/26/21 ?0450  ?NA 135   < > 137 134*  ?K 3.9   < > 3.5 3.3*  ?CL 96*  --   --  95*  ?CO2 33*  --   --  32  ?GLUCOSE 163*  --   --  169*  ?BUN 34*  --   --  29*  ?CREATININE 1.45*  --   --  1.36*  ?CALCIUM 8.3*  --   --  8.0*  ? < > = values in this interval not displayed.  ? ?Liver Function Tests ?No results for input(s): AST, ALT, ALKPHOS, BILITOT, PROT, ALBUMIN in  the last 72 hours. ?No results for input(s): LIPASE, AMYLASE in the last 72 hours. ?Cardiac Enzymes ?No results for input(s): CKTOTAL, CKMB, CKMBINDEX, TROPONINI in the last 72 hours. ?BNP ?Invalid input(s): POCBNP ?D-Dimer ?No results for input(s): DDIMER in the last 72 hours. ?Hemoglobin A1C ?No results for input(s): HGBA1C in the last 72 hours. ?Fasting Lipid Panel ?No results for input(s): CHOL, HDL, LDLCALC, TRIG, CHOLHDL, LDLDIRECT in the last 72 hours. ?Thyroid Function Tests ?No results for input(s): TSH, T4TOTAL, T3FREE, THYROIDAB in the last 72 hours. ? ?Invalid input(s): FREET3 ? ?Telemetry  ?  ?08/26/21 AF in the 70's - Personally Reviewed ? ?ECG  ?  ?No new tracing as of 08/26/21 - Personally Reviewed ? ?Radiology  ?  ?CARDIAC CATHETERIZATION ? ?Result Date: 08/25/2021 ?  Mid LAD lesion is 50% stenosed.   Ost RCA to Prox RCA lesion is 70% stenosed.   Mid RCA to Dist RCA lesion is 100% stenosed.   Ost LAD to Prox LAD lesion is 20% stenosed.   Prox Cx to Mid Cx lesion is 60% stenosed.   1st Mrg lesion is 60% stenosed.   Hemodynamic findings consistent with moderate pulmonary hypertension.   There is moderate aortic valve stenosis. Multivessel CAD with coronary calcification.  The LAD has mild 20% proximal narrowing and then has diffuse 40 to 50% mid stenoses after the first diagonal vessel; the left circumflex vessel gives rise to a high marginal branch which bifurcates and extends to the apex and has diffuse 60% proximal to mid stenosis, the AV groove circumflex after OM1 is segmentally narrowed at 60%; the RCA has 70% proximal stenosis and then has previously noted chronic total occlusion.  There is faint collateralization to a portion of the distal RCA from the conus branch and faint collateralization from the LAD to the PDA and PLA system. Moderate elevation of right heart pressures with moderate pulmonary artery stenosis with mean PA pressure 35 mm. Calcified aortic valve with mean gradient of 34 mmHg,  peak to peak 36 mm  Hg with at least moderate stenosis Severely calcified mitral annulus. RECOMMENDATION: The patient will be undergoing evaluation with the structural heart team for potential TAVR.  His warfarin has been held procedure for 3 days.  Anticoagulation will need to be instituted at this may need to be deferred until the patient is tentatively scheduled total amputation tomorrow.   ? ?Cardiac  Studies  ? ?LHC: 08/25/2021 ? ?  Mid LAD lesion is 50% stenosed. ?  Ost RCA to Prox RCA lesion is 70% stenosed. ?  Mid RCA to Dist RCA lesion is 100% stenosed. ?  Ost LAD to Prox LAD lesion is 20% stenosed. ?  Prox Cx to Mid Cx lesion is 60% stenosed. ?  1st Mrg lesion is 60% stenosed. ?  Hemodynamic findings consistent with moderate pulmonary hypertension. ?  There is moderate aortic valve stenosis. ?  ?Orthopantogram 08/21/21: ?  ?FINDINGS: ?Prominent dental caries are present within teeth 6, 7, 9, 10. ?Smaller caries are present in the remaining maxillary teeth. Modest ?caries are present teeth 18 and 19. ?  ?The mandible is intact and located. No other osseous abnormalities ?are present. ?  ?IMPRESSION: ?1. Prominent dental caries within teeth 6, 7, 9, 10. ?2. Modest caries involving teeth 18 and 19. ?  ?Vas Duplex 08/21/21: ?Summary:  ?Right: Patent stent with no evidence of stenosis in the Distal CFA artery  ?and SFA to below knee BPG graft stent.  ?  ?  ?LHC: 09/2019 ?  ?IMPRESSION: Mr. Trost has mild nonobstructive disease in his left system including the AV groove circumflex and LAD with a chronically occluded RCA, faint grade 1 left to right collaterals and a 30 mm peak to peak gradient across his aortic valve.  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  His LVEDP was 35.  At this point, I would recommend medical therapy.  The sheath was removed and a TR band was placed on the right wrist to achieve patent hemostasis.  Patient left lab in stable condition.  I have communicated these results to Dr.  Audie Box . ?  ?Diagnostic ?Dominance: Right ?  ?Echo: 05/2021 ? ? 1. Left ventricular ejection fraction, by estimation, is 55 to 60%. The  ?left ventricle has normal function. The left ventricle has no regional

## 2021-08-26 NOTE — Transfer of Care (Signed)
Immediate Anesthesia Transfer of Care Note ? ?Patient: Rodney [name] ? ?Procedure(s) Performed: AMPUTATION 2nd TOE (Right: Toe) ? ?Patient Location: PACU ? ?Anesthesia Type:MAC ? ?Level of Consciousness: awake, alert  and oriented ? ?Airway & Oxygen Therapy: Patient Spontanous Breathing and Patient connected to nasal cannula oxygen ? ?Post-op Assessment: Report given to RN and Post -op Vital signs reviewed and stable ? ?Post vital signs: Reviewed and stable ? ?Last Vitals:  ?Vitals Value Taken Time  ?BP    ?Temp    ?Pulse 72 08/26/21 1330  ?Resp 21 08/26/21 1330  ?SpO2 89 % 08/26/21 1330  ?Vitals shown include unvalidated device data. ? ?Last Pain:  ?Vitals:  ? 08/26/21 0852  ?TempSrc:   ?PainSc: 0-No pain  ?   ? ?  ? ?Complications: No notable events documented. ?

## 2021-08-26 NOTE — Progress Notes (Signed)
?  Progress Note ? ? ? ?08/26/2021 ?11:45 AM ?Day of Surgery ? ?Subjective: No overnight issues ? ?Vitals:  ? 08/26/21 0729 08/26/21 6734  ?BP:  (!) 141/67  ?Pulse:  73  ?Resp:  18  ?Temp:  97.8 ?F (36.6 ?C)  ?SpO2: 100% 100%  ? ? ?Physical Exam: ?Awake alert oriented ?Nonlabored respirations ?Right foot is well-perfused ?Second toe with stable gangrenous changes on the distal tip ? ?CBC ?   ?Component Value Date/Time  ? WBC 6.9 08/26/2021 0450  ? RBC 3.01 (L) 08/26/2021 0450  ? HGB 8.8 (L) 08/26/2021 0450  ? HGB 8.4 (L) 05/19/2021 1200  ? HCT 27.3 (L) 08/26/2021 0450  ? HCT 28.2 (L) 05/19/2021 1200  ? PLT 181 08/26/2021 0450  ? PLT 255 05/19/2021 1200  ? MCV 90.7 08/26/2021 0450  ? MCV 84 05/19/2021 1200  ? MCH 29.2 08/26/2021 0450  ? MCHC 32.2 08/26/2021 0450  ? RDW 15.6 (H) 08/26/2021 0450  ? RDW 16.2 (H) 05/19/2021 1200  ? LYMPHSABS 0.8 08/19/2021 0545  ? MONOABS 0.8 08/19/2021 0545  ? EOSABS 0.3 08/19/2021 0545  ? BASOSABS 0.1 08/19/2021 0545  ? ? ?BMET ?   ?Component Value Date/Time  ? NA 134 (L) 08/26/2021 0450  ? NA 141 05/19/2021 1200  ? K 3.3 (L) 08/26/2021 0450  ? CL 95 (L) 08/26/2021 0450  ? CO2 32 08/26/2021 0450  ? GLUCOSE 169 (H) 08/26/2021 0450  ? BUN 29 (H) 08/26/2021 0450  ? BUN 49 (H) 05/19/2021 1200  ? CREATININE 1.36 (H) 08/26/2021 0450  ? CREATININE 1.19 10/09/2015 1554  ? CALCIUM 8.0 (L) 08/26/2021 0450  ? GFRNONAA 56 (L) 08/26/2021 0450  ? GFRNONAA 65 10/09/2015 1554  ? GFRAA 52 (L) 05/01/2020 1632  ? GFRAA 75 10/09/2015 1554  ? ? ?INR ?   ?Component Value Date/Time  ? INR 1.1 08/26/2021 0450  ? ? ? ?Intake/Output Summary (Last 24 hours) at 08/26/2021 1145 ?Last data filed at 08/26/2021 1035 ?Gross per 24 hour  ?Intake 2165.58 ml  ?Output 3375 ml  ?Net -1209.42 ml  ? ? ? ?Assessment/plan:  71 y.o. male is status post revascularization right lower extremity with previous right great toe amputation.  Now has gangrenous changes of the second toe.  Plan for right second toe amputation in preparation for  TAVR. ? ? ? ? ?Tuesday Terlecki C. Donzetta Matters, MD ?[facility] ?Office: [phone] ?Pager: [phone] ? ?08/26/2021 ?11:45 AM ? ?

## 2021-08-26 NOTE — Progress Notes (Addendum)
ANTICOAGULATION CONSULT NOTE - Follow-Up Consult ? ?Pharmacy Consult for warfarin to IV heparin  ?Indication: atrial fibrillation and DVT ? ?No Known Allergies ? ?Patient Measurements: ?Height: 6' (182.9 cm) ?Weight: 90.5 kg (199 lb 8.3 oz) ?IBW/kg (Calculated) : 77.6 ?Heparin dosing weight: 96 kg ? ?Vital Signs: ?Temp: 97.9 ?F (36.6 ?C) (05/02 1558) ?Temp Source: Oral (05/02 1558) ?BP: 138/73 (05/02 1558) ?Pulse Rate: 71 (05/02 1558) ? ?Labs: ?Recent Labs  ?  08/24/21 ?0229 08/25/21 ?0134 08/25/21 ?1508 08/25/21 ?1518 08/26/21 ?0450  ?HGB 8.4* 8.6* 9.5*  9.9* 9.5* 8.8*  ?HCT 26.0* 26.3* 28.0*  29.0* 28.0* 27.3*  ?PLT 186 193  --   --  181  ?LABPROT 15.2 15.4*  --   --  13.6  ?INR 1.2 1.2  --   --  1.1  ?HEPARINUNFRC 0.56 0.37  --   --   --   ?CREATININE 1.33* 1.45*  --   --  1.36*  ? ? ? ?Estimated Creatinine Clearance: 56.3 mL/min (A) (by C-G formula based on SCr of 1.36 mg/dL (H)). ? ? ?Medical History: ?Past Medical History:  ?Diagnosis Date  ? Acute combined systolic and diastolic congestive heart failure ([place])   ? Acute on chronic heart failure with preserved ejection fraction (HFpEF) ([place])   ? Acute respiratory failure with hypoxia ([place])   ? Acute respiratory failure with hypoxia ([place])   ? Aortic stenosis   ? moderate in 2022  ? Atrial fibrillation ([place])   ? CHF (congestive heart failure) ([place])   ? Chronic pleural effusion, Left 11/16/2020  ? COPD exacerbation ([place]) 12/09/2020  ? Coronary artery disease   ? Demand ischemia ([place]) 03/26/2021  ? Diabetes mellitus without complication ([place])   ? HLD (hyperlipidemia)   ? Hypertension   ? Long term (current) use of anticoagulants 12/29/2019  ? Malnutrition of moderate degree 05/09/2021  ? Peripheral arterial disease ([place])   ? Pneumonia due to COVID-19 virus 05/29/2021  ? ? ?Assessment: ?71 yo M presenting with dyspnea 2/2 COPD exacerbation. PMH significant for afib on PTA warfarin 2.'5mg'$  daily. Pharmacy consulted for warfarin dosing - now on hold. R/LHC planned for  today - pharmacy consulted to transition to IV heparin. ? ?INR 1.1  ?Prior heparin infusion rate: 1500 units/hr ?Hemoglobin stable 9.5, platelets normal ? ?Resume heparin post-toe amputation ? ?Goal of Therapy:  ?Heparin level 0.3-0.7 units/ml ?INR 2-2.5 (decreased due to normocytic anemia in past) ?Monitor platelets by anticoagulation protocol: Yes ?  ?Plan:  ?Restart heparin infusion at 1500 units/hr  ?Continue to hold warfarin ?Heparin level in 8 hours ?Daily heparin level and CBC ?Monitor for s/s of bleeding ? ?Alanda Slim, PharmD, FCCM ?Clinical Pharmacist ?Please see AMION for all Pharmacists' Contact Phone Numbers ?08/26/2021, 4:50 PM  ? ? ?

## 2021-08-26 NOTE — Progress Notes (Signed)
FPTS Brief Progress Note ? ?S:Went bedside to see patient, patient resting comfortably. Did not disturb.  ? ? ?O: ?BP 130/67 (BP Location: Right Arm)   Pulse 75   Temp 98.1 ?F (36.7 ?C) (Oral)   Resp 15   Ht 6' (1.829 m)   Wt 95.8 kg   SpO2 100%   BMI 28.64 kg/m?   ? ? ?A/P: ?- Plans per day team ?- NPO  ?- Toe amputation today ?- Orders reviewed. Labs for AM ordered, which was adjusted as needed.  ? ? ?Holley Bouche, MD ?08/26/2021, 1:45 AM ?PGY-1, [facility] Night Resident  ?Please page [phone] with questions.  ? ? ?

## 2021-08-26 NOTE — Op Note (Signed)
? ? ?  Patient name: Rodney [name] MRN: [id number] DOB: 10-12-1950 Sex: male ? ?08/26/2021 ?Pre-operative Diagnosis: gangrene right 2nd toe ?Post-operative diagnosis:  Same ?Surgeon:  Eda Paschal. Donzetta Matters, MD ?Procedure Performed:  Amputation of right 2nd toe ? ?Indications:  71yo male with history of pvd with previous right lower extremity bypass and a previous right great toe amputation.  He is now being considered for TAVR and has gangrene of the second toe and is indicated for amputation. ? ?Findings: There was adequate bleeding in the wound bed to suggest healing potential.  The amputation site closed without tension. ?  ?Procedure:  The patient was identified in the holding area and taken to the operating room where MAC anesthesia was induced.  He was sterilely prepped and draped in the right foot in usual fashion, antibiotics were minister timeout was called.  Approximately 10 cc of 1% lidocaine was administered.  An incision was made around the base of the second toe.  We took this down to the bone and removed it.  Rongeur was used to remove excess bony tissue.  The wound was thoroughly irrigated.  Hemostasis was obtained.  The wound was closed with interrupted 3-0 nylon suture.  Sterile dressing was applied.  He was awakened from anesthesia having tolerated procedure without immediate complication.  All counts were correct at completion. ? ?EBL: 10cc ? ? ?Pessy Delamar C. Donzetta Matters, MD ?[facility] ?Office: [phone] ?Pager: [phone] ? ? ?

## 2021-08-26 NOTE — Progress Notes (Addendum)
Family Medicine Teaching Service ?Daily Progress Note ?Intern Pager: [phone] ? ?Patient name: Rodney [name] Medical record number: [id number] ?Date of birth: [birth year] Age: 71 y.o. Gender: male ? ?Primary Care Provider: [name], MD ?Consultants: Cardiology, vascular surgery ?Code Status: Full code ? ?Pt Overview and Major Events to Date:  ?4/25-admitted, placed on BiPAP briefly then weaned to nasal cannula, started cefepime and given IV methylprednisolone ?4/26-transitioned steroids to oral prednisone, transfused 1u PRBC ?4/27-cefepime transitioned to cefdinir ?4/29-completed course of cefdinir and prednisone ?5/1-B/l heart cath  ?5/2-Right toe amputation ?  ?Assessment and Plan: ?Rodney [name] is a 70-year initially admitted for COPD exacerbation (now resolved).  Now experiencing C. difficile recurrence and undergoing preop preparation for TAVR, which includes L+RHC and amputation of gangrenous toe. PMHx significant for: COPD, HFrEF, severe AS, CAD, PAD s/p left BKA, PAF on warfarin, T2DM, CKD stage III, anemia of chronic disease, protein calorie malnutrition. ?  ?C. difficile recurrence, improving ?BM x1, more formed.  ?- Continued Pradaxa mycin 200 mg BID x10d, end date 09/02/21  ? ?HFpEF 2/2 Severe AS & CAD s/p L&R Heart Cath (5/1) ?Unclear dry weight, baseline 3L O2 [place]. Peripheral edema 1+ to ankle, with no other signs of volume overload. Currently on home dose Lasix. K and Cr remains stable.  ?- Cardiology following, appreciate assistance and recs ?- Strict I/Os and daily weights ?- BMP and Mg2+ daily ?- K+ >4, Mg2+ >2, replete as needed ?- PO lasix per cards - holding 5/2 for surgery, restart 5/3 ?- TAVR preop: right toe amputation 5/2, maybe dental extraction ?- Oral surgeon consulted, recommended orthogram  ? 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 ?pAF, rate controlled and AC ?Normotensive BP. ?- Continued Coreg  6.25 mg BID  ?- Continued heparin until dc for procedures, then bridge to warfarin ? ?Anemia of CKD and iron deficiency ?Holding IV iron currently due to soon toe amputation. Would like to provide IV Fe to patient after amputation and before dc if possible. Hb stable. ?- Transfusion if Hb <8 ? ?Acute on chronic hypoxemic respiratory failure secondary to COPD exacerbation; resolved ?Baseline 3L  currently. Completed prednisone and cefdinir x5d 4/30. ?- Duo nebs BID ?- Continued Breo Ellipta daily ?- O2 goal: 80-92% ? ?CKD III, improving and stable ?Stable, at baseline Cr. Cr 1.33 ?- Labs per above ? ?T2DM, controlled ?- Continued Levemir 15u daily ?- mSSI ?   ?Mediastinal lymph nodes ?- Outpatient PET follow-up ?  ?GERD ?- Protonix 40 mg ?  ?Insomnia ?- Trazodone nightly as needed ? ?FEN/GI: n.p.o. for heart cath  ?PPx: Protonix ?Dispo:Pending PT recommendations  pending clinical improvement . Barriers include TAVR ? ?Subjective:  ?No family at bedside. ? ?Patient was resting comfortably in bed, no distress. 1 BM yesterday, no diarrhea. No other concerns. ? ?Objective: ?Temp:  [97.8 ?F (36.6 ?C)-98.4 ?F (36.9 ?C)] 97.8 ?F (36.6 ?C) (05/02 8309) ?Pulse Rate:  [59-79] 73 (05/02 0812) ?Resp:  [15-18] 18 (05/02 4076) ?BP: (119-141)/(67-78) 141/67 (05/02 8088) ?SpO2:  [90 %-100 %] 100 % (05/02 0812) ?Weight:  [90.5 kg] 90.5 kg (05/02 0432) ?Physical Exam: ?General: Awake, alert, no acute distress ?Cardiovascular: murmur ?Respiratory: CTAB. Normal breathing efforts.  ?Abdomen: Soft, nondistended, nontender ? ?Laboratory: ?Recent Labs  ?Lab 08/24/21 ?0229 08/25/21 ?0134 08/25/21 ?1508 08/25/21 ?1518 08/26/21 ?0450  ?WBC 7.0 8.4  --   --  6.9  ?HGB 8.4* 8.6* 9.5*  9.9* 9.5* 8.8*  ?HCT 26.0* 26.3* 28.0*  29.0* 28.0* 27.3*  ?PLT 186 193  --   --  181  ? ? ?Recent Labs  ?Lab 08/24/21 ?0229 08/25/21 ?0134 08/25/21 ?1508 08/25/21 ?1518 08/26/21 ?0450  ?NA 133* 135 139  137 137 134*  ?K 3.9 3.9 3.4*  3.5 3.5 3.3*  ?CL 92*  96*  --   --  95*  ?CO2 34* 33*  --   --  32  ?BUN 33* 34*  --   --  29*  ?CREATININE 1.33* 1.45*  --   --  1.36*  ?CALCIUM 8.5* 8.3*  --   --  8.0*  ?GLUCOSE 213* 163*  --   --  169*  ? ? ?Imaging/Diagnostic Tests: ?CARDIAC CATHETERIZATION ? ?Result Date: 08/25/2021 ?  Mid LAD lesion is 50% stenosed.   Ost RCA to Prox RCA lesion is 70% stenosed.   Mid RCA to Dist RCA lesion is 100% stenosed.   Ost LAD to Prox LAD lesion is 20% stenosed.   Prox Cx to Mid Cx lesion is 60% stenosed.   1st Mrg lesion is 60% stenosed.   Hemodynamic findings consistent with moderate pulmonary hypertension.   There is moderate aortic valve stenosis. Multivessel CAD with coronary calcification.  The LAD has mild 20% proximal narrowing and then has diffuse 40 to 50% mid stenoses after the first diagonal vessel; the left circumflex vessel gives rise to a high marginal branch which bifurcates and extends to the apex and has diffuse 60% proximal to mid stenosis, the AV groove circumflex after OM1 is segmentally narrowed at 60%; the RCA has 70% proximal stenosis and then has previously noted chronic total occlusion.  There is faint collateralization to a portion of the distal RCA from the conus branch and faint collateralization from the LAD to the PDA and PLA system. Moderate elevation of right heart pressures with moderate pulmonary artery stenosis with mean PA pressure 35 mm. Calcified aortic valve with mean gradient of 34 mmHg, peak to peak 36 mm  Hg with at least moderate stenosis Severely calcified mitral annulus. RECOMMENDATION: The patient will be undergoing evaluation with the structural heart team for potential TAVR.  His warfarin has been held procedure for 3 days.  Anticoagulation will need to be instituted at this may need to be deferred until the patient is tentatively scheduled total amputation tomorrow.    ? ?Merrily Brittle, DO ?08/26/2021, 10:41 AM ?PGY-1, [facility] ?[facility] Intern pager: [phone], text pages  welcome ?

## 2021-08-26 NOTE — Progress Notes (Signed)
?Cardiology Progress Note  ?Patient ID: [name] ?MRN: [id number] ?DOB: Jan 03, 1951 ?Date of Encounter: 08/26/2021 ? ?Primary Cardiologist: Evalina Field, MD ? ?Subjective  ? ?Chief Complaint: None. ? ?HPI: Plan for right second toe amputation today.  Right radial cath site clean and dry.  2+ pulse.  Denies chest pain or trouble breathing. ? ?ROS:  ?All other ROS reviewed and negative. Pertinent positives noted in the HPI.    ? ?Inpatient Medications  ?Scheduled Meds: ? acetaminophen  1,000 mg Oral Once  ? atorvastatin  80 mg Oral Daily  ? carvedilol  6.25 mg Oral BID WC  ? clopidogrel  75 mg Oral Q breakfast  ? fidaxomicin  200 mg Oral BID  ? fluticasone furoate-vilanterol  1 puff Inhalation Daily  ? furosemide  40 mg Oral Daily  ? insulin aspart  0-20 Units Subcutaneous TID WC  ? insulin detemir  15 Units Subcutaneous QHS  ? ipratropium-albuterol  3 mL Nebulization BID  ? pantoprazole  40 mg Oral BID  ? sodium chloride flush  3 mL Intravenous Q12H  ? sodium chloride flush  3 mL Intravenous Q12H  ? umeclidinium bromide  1 puff Inhalation Daily  ? ?Continuous Infusions: ? sodium chloride    ? potassium chloride    ? ?PRN Meds: ?sodium chloride, acetaminophen, ipratropium-albuterol, ondansetron (ZOFRAN) IV, sodium chloride flush, traZODone  ? ?Vital Signs  ? ?Vitals:  ? 08/25/21 2220 08/25/21 2255 08/26/21 6063 08/26/21 0432  ?BP:  119/78 130/67 129/69  ?Pulse: 76 75 75 75  ?Resp:  '18 15 18  '$ ?Temp:  98.4 ?F (36.9 ?C) 98.1 ?F (36.7 ?C) 98.2 ?F (36.8 ?C)  ?TempSrc:  Oral Oral Oral  ?SpO2: 99% 100% 100% 100%  ?Weight:    90.5 kg  ?Height:      ? ? ?Intake/Output Summary (Last 24 hours) at 08/26/2021 0160 ?Last data filed at 08/26/2021 0500 ?Gross per 24 hour  ?Intake 2162.58 ml  ?Output 3725 ml  ?Net -1562.42 ml  ? ? ?  08/26/2021  ?  4:32 AM 08/25/2021  ? 12:29 AM 08/24/2021  ?  1:07 AM  ?Last 3 Weights  ?Weight (lbs) 199 lb 8.3 oz 211 lb 3.2 oz 213 lb 6.5 oz  ?Weight (kg) 90.5 kg 95.8 kg 96.8 kg  ?   ? ?Telemetry   ?Overnight telemetry shows Afib 70s, which I personally reviewed.  ? ? ?Physical Exam  ? ?Vitals:  ? 08/25/21 2220 08/25/21 2255 08/26/21 1093 08/26/21 0432  ?BP:  119/78 130/67 129/69  ?Pulse: 76 75 75 75  ?Resp:  '18 15 18  '$ ?Temp:  98.4 ?F (36.9 ?C) 98.1 ?F (36.7 ?C) 98.2 ?F (36.8 ?C)  ?TempSrc:  Oral Oral Oral  ?SpO2: 99% 100% 100% 100%  ?Weight:    90.5 kg  ?Height:      ?  ?Intake/Output Summary (Last 24 hours) at 08/26/2021 2355 ?Last data filed at 08/26/2021 0500 ?Gross per 24 hour  ?Intake 2162.58 ml  ?Output 3725 ml  ?Net -1562.42 ml  ?  ? ?  08/26/2021  ?  4:32 AM 08/25/2021  ? 12:29 AM 08/24/2021  ?  1:07 AM  ?Last 3 Weights  ?Weight (lbs) 199 lb 8.3 oz 211 lb 3.2 oz 213 lb 6.5 oz  ?Weight (kg) 90.5 kg 95.8 kg 96.8 kg  ?  Body mass index is 27.06 kg/m?.  ?General: Well nourished, well developed, in no acute distress ?Head: Atraumatic, normal size  ?Eyes: PEERLA, EOMI  ?Neck: Supple, no  JVD ?Endocrine: No thryomegaly ?Cardiac: Normal S1, irregular rhythm, harsh 3 out of 6 systolic ejection murmur, late peaking ?Lungs: Diminished breath sounds bilaterally ?Abd: Soft, nontender, no hepatomegaly  ?Ext: 2+ right radial pulse, status post left BKA, status post left right great toe amputation, gangrene noted in the second right toe ?Musculoskeletal: No deformities, BUE and BLE strength normal and equal ?Skin: Warm and dry, no rashes   ?Neuro: Alert and oriented to person, place, time, and situation, CNII-XII grossly intact, no focal deficits  ?Psych: Normal mood and affect  ? ?Labs  ?High Sensitivity Troponin:   ?Recent Labs  ?Lab 08/19/21 ?5681 08/19/21 ?2751  ?TROPONINIHS 36* 40*  ?   ?Cardiac EnzymesNo results for input(s): TROPONINI in the last 168 hours. No results for input(s): TROPIPOC in the last 168 hours.  ?Chemistry ?Recent Labs  ?Lab 08/24/21 ?0229 08/25/21 ?0134 08/25/21 ?1508 08/25/21 ?1518 08/26/21 ?0450  ?NA 133* 135 139  137 137 134*  ?K 3.9 3.9 3.4*  3.5 3.5 3.3*  ?CL 92* 96*  --   --  95*  ?CO2 34*  33*  --   --  32  ?GLUCOSE 213* 163*  --   --  169*  ?BUN 33* 34*  --   --  29*  ?CREATININE 1.33* 1.45*  --   --  1.36*  ?CALCIUM 8.5* 8.3*  --   --  8.0*  ?GFRNONAA 58* 52*  --   --  56*  ?ANIONGAP 7 6  --   --  7  ?  ?Hematology ?Recent Labs  ?Lab 08/24/21 ?0229 08/25/21 ?0134 08/25/21 ?1508 08/25/21 ?1518 08/26/21 ?0450  ?WBC 7.0 8.4  --   --  6.9  ?RBC 2.87* 2.89*  --   --  3.01*  ?HGB 8.4* 8.6* 9.5*  9.9* 9.5* 8.8*  ?HCT 26.0* 26.3* 28.0*  29.0* 28.0* 27.3*  ?MCV 90.6 91.0  --   --  90.7  ?MCH 29.3 29.8  --   --  29.2  ?MCHC 32.3 32.7  --   --  32.2  ?RDW 15.6* 15.8*  --   --  15.6*  ?PLT 186 193  --   --  181  ? ?BNPNo results for input(s): BNP, PROBNP in the last 168 hours.  ?DDimer No results for input(s): DDIMER in the last 168 hours.  ? ?Radiology  ?CARDIAC CATHETERIZATION ? ?Result Date: 08/25/2021 ?  Mid LAD lesion is 50% stenosed.   Ost RCA to Prox RCA lesion is 70% stenosed.   Mid RCA to Dist RCA lesion is 100% stenosed.   Ost LAD to Prox LAD lesion is 20% stenosed.   Prox Cx to Mid Cx lesion is 60% stenosed.   1st Mrg lesion is 60% stenosed.   Hemodynamic findings consistent with moderate pulmonary hypertension.   There is moderate aortic valve stenosis. Multivessel CAD with coronary calcification.  The LAD has mild 20% proximal narrowing and then has diffuse 40 to 50% mid stenoses after the first diagonal vessel; the left circumflex vessel gives rise to a high marginal branch which bifurcates and extends to the apex and has diffuse 60% proximal to mid stenosis, the AV groove circumflex after OM1 is segmentally narrowed at 60%; the RCA has 70% proximal stenosis and then has previously noted chronic total occlusion.  There is faint collateralization to a portion of the distal RCA from the conus branch and faint collateralization from the LAD to the PDA and PLA system. Moderate elevation of right heart pressures with  moderate pulmonary artery stenosis with mean PA pressure 35 mm. Calcified aortic valve  with mean gradient of 34 mmHg, peak to peak 36 mm  Hg with at least moderate stenosis Severely calcified mitral annulus. RECOMMENDATION: The patient will be undergoing evaluation with the structural heart team for potential TAVR.  His warfarin has been held procedure for 3 days.  Anticoagulation will need to be instituted at this may need to be deferred until the patient is tentatively scheduled total amputation tomorrow.   ? ?Cardiac Studies  ?LHC 08/25/2021 ?  Mid LAD lesion is 50% stenosed. ?  Ost RCA to Prox RCA lesion is 70% stenosed. ?  Mid RCA to Dist RCA lesion is 100% stenosed. ?  Ost LAD to Prox LAD lesion is 20% stenosed. ?  Prox Cx to Mid Cx lesion is 60% stenosed. ?  1st Mrg lesion is 60% stenosed. ?  Hemodynamic findings consistent with moderate pulmonary hypertension. ?  There is moderate aortic valve stenosis. ? ?TTE 06/23/2021 ? ? 1. Left ventricular ejection fraction, by estimation, is 55 to 60%. The  ?left ventricle has normal function. The left ventricle has no regional  ?wall motion abnormalities. There is mild concentric left ventricular  ?hypertrophy. Left ventricular diastolic  ?parameters are indeterminate.  ? 2. Right ventricular systolic function is normal. The right ventricular  ?size is normal. There is mildly elevated pulmonary artery systolic  ?pressure.  ? 3. Left atrial size was mildly dilated.  ? 4. Right atrial size was mildly dilated.  ? 5. The mitral valve is grossly normal. Mild mitral valve regurgitation.  ?Moderate mitral annular calcification.  ? 6. The aortic valve is calcified. There is severe calcifcation of the  ?aortic valve. There is severe thickening of the aortic valve. Aortic valve  ?regurgitation is trivial. Severe aortic valve stenosis. Aortic valve area,  ?by VTI measures 0.75 cm?Marland Kitchen Aortic  ?valve mean gradient measures 43.0 mmHg. Aortic valve Vmax measures 4.30  ?m/s. DI 0.22  ? 7. Aortic dilatation noted. There is borderline dilatation of the aortic  ?root,  measuring 36 mm. There is mild dilatation of the ascending aorta,  ?measuring 38 mm.  ? 8. The inferior vena cava is dilated in size with <50% respiratory  ?variability, suggesting right atrial pressure of 15 mmHg.  ?

## 2021-08-26 NOTE — Anesthesia Procedure Notes (Signed)
Procedure Name: [place] ?Date/Time: 08/26/2021 12:50 PM ?Performed by: Leonor Liv, CRNA ?Pre-anesthesia Checklist: Patient identified, Emergency Drugs available, Suction available, Patient being monitored and Timeout performed ?Patient Re-evaluated:Patient Re-evaluated prior to induction ?Oxygen Delivery Method: Circle system utilized and Simple face mask ?Placement Confirmation: positive ETCO2 ?Dental Injury: Teeth and Oropharynx as per pre-operative assessment  ? ? ? ? ?

## 2021-08-27 ENCOUNTER — Encounter (HOSPITAL_COMMUNITY): Payer: Self-pay | Admitting: Vascular Surgery

## 2021-08-27 DIAGNOSIS — J441 Chronic obstructive pulmonary disease with (acute) exacerbation: Secondary | ICD-10-CM | POA: Diagnosis not present

## 2021-08-27 DIAGNOSIS — N179 Acute kidney failure, unspecified: Secondary | ICD-10-CM | POA: Diagnosis not present

## 2021-08-27 DIAGNOSIS — J9622 Acute and chronic respiratory failure with hypercapnia: Secondary | ICD-10-CM | POA: Diagnosis not present

## 2021-08-27 DIAGNOSIS — J9621 Acute and chronic respiratory failure with hypoxia: Secondary | ICD-10-CM | POA: Diagnosis not present

## 2021-08-27 DIAGNOSIS — I480 Paroxysmal atrial fibrillation: Secondary | ICD-10-CM | POA: Diagnosis not present

## 2021-08-27 LAB — CBC
HCT: 25.9 % — ABNORMAL LOW (ref 39.0–52.0)
Hemoglobin: 8.2 g/dL — ABNORMAL LOW (ref 13.0–17.0)
MCH: 29.2 pg (ref 26.0–34.0)
MCHC: 31.7 g/dL (ref 30.0–36.0)
MCV: 92.2 fL (ref 80.0–100.0)
Platelets: 157 10*3/uL (ref 150–400)
RBC: 2.81 MIL/uL — ABNORMAL LOW (ref 4.22–5.81)
RDW: 15.4 % (ref 11.5–15.5)
WBC: 7.6 10*3/uL (ref 4.0–10.5)
nRBC: 0 % (ref 0.0–0.2)

## 2021-08-27 LAB — BASIC METABOLIC PANEL
Anion gap: 5 (ref 5–15)
BUN: 25 mg/dL — ABNORMAL HIGH (ref 8–23)
CO2: 31 mmol/L (ref 22–32)
Calcium: 7.9 mg/dL — ABNORMAL LOW (ref 8.9–10.3)
Chloride: 99 mmol/L (ref 98–111)
Creatinine, Ser: 1.25 mg/dL — ABNORMAL HIGH (ref 0.61–1.24)
GFR, Estimated: >60 mL/min (ref >60)
Glucose, Bld: 246 mg/dL — ABNORMAL HIGH (ref 70–99)
Potassium: 4 mmol/L (ref 3.5–5.1)
Sodium: 135 mmol/L (ref 135–145)

## 2021-08-27 LAB — PROTIME-INR
INR: 1.3 — ABNORMAL HIGH (ref 0.8–1.2)
Prothrombin Time: 15.7 seconds — ABNORMAL HIGH (ref 11.4–15.2)

## 2021-08-27 LAB — GLUCOSE, CAPILLARY
Glucose-Capillary: 180 mg/dL — ABNORMAL HIGH (ref 70–99)
Glucose-Capillary: 154 mg/dL — ABNORMAL HIGH (ref 70–99)
Glucose-Capillary: 157 mg/dL — ABNORMAL HIGH (ref 70–99)
Glucose-Capillary: 190 mg/dL — ABNORMAL HIGH (ref 70–99)

## 2021-08-27 LAB — HEPARIN LEVEL (UNFRACTIONATED)
Heparin Unfractionated: 0.26 IU/mL — ABNORMAL LOW (ref 0.30–0.70)
Heparin Unfractionated: 0.35 IU/mL (ref 0.30–0.70)

## 2021-08-27 MED ORDER — LOSARTAN POTASSIUM 25 MG PO TABS
12.5000 mg | ORAL_TABLET | Freq: Every day | ORAL | Status: DC
  Administered 2021-08-27 – 2021-09-03 (×8): 12.5 mg via ORAL
  Filled 2021-08-27 (×8): qty 1

## 2021-08-27 MED ORDER — FUROSEMIDE 40 MG PO TABS
40.0000 mg | ORAL_TABLET | Freq: Every day | ORAL | Status: DC
  Administered 2021-08-28 – 2021-08-29 (×2): 40 mg via ORAL
  Filled 2021-08-27 (×2): qty 1

## 2021-08-27 NOTE — Progress Notes (Signed)
ANTICOAGULATION CONSULT NOTE - Follow-Up Consult ? ?Pharmacy Consult for warfarin to IV heparin  ?Indication: atrial fibrillation and DVT ? ?No Known Allergies ? ?Patient Measurements: ?Height: 6' (182.9 cm) ?Weight: 90.5 kg (199 lb 8.3 oz) ?IBW/kg (Calculated) : 77.6 ?Heparin dosing weight: 96 kg ? ?Vital Signs: ?Temp: 98.5 ?F (36.9 ?C) (05/03 0023) ?Temp Source: Oral (05/03 0023) ?BP: 125/72 (05/03 0023) ?Pulse Rate: 75 (05/03 0023) ? ?Labs: ?Recent Labs  ?  08/25/21 ?0134 08/25/21 ?1508 08/25/21 ?1518 08/26/21 ?0450 08/27/21 ?0127  ?HGB 8.6*   < > 9.5* 8.8* 8.2*  ?HCT 26.3*   < > 28.0* 27.3* 25.9*  ?PLT 193  --   --  181 157  ?LABPROT 15.4*  --   --  13.6 15.7*  ?INR 1.2  --   --  1.1 1.3*  ?HEPARINUNFRC 0.37  --   --   --  0.26*  ?CREATININE 1.45*  --   --  1.36* 1.25*  ? < > = values in this interval not displayed.  ? ? ? ?Estimated Creatinine Clearance: 61.2 mL/min (A) (by C-G formula based on SCr of 1.25 mg/dL (H)). ? ? ?Medical History: ?Past Medical History:  ?Diagnosis Date  ? Acute combined systolic and diastolic congestive heart failure ([place])   ? Acute on chronic heart failure with preserved ejection fraction (HFpEF) ([place])   ? Acute respiratory failure with hypoxia ([place])   ? Acute respiratory failure with hypoxia ([place])   ? Aortic stenosis   ? moderate in 2022  ? Atrial fibrillation ([place])   ? CHF (congestive heart failure) ([place])   ? Chronic pleural effusion, Left 11/16/2020  ? COPD exacerbation ([place]) 12/09/2020  ? Coronary artery disease   ? Demand ischemia ([place]) 03/26/2021  ? Diabetes mellitus without complication ([place])   ? HLD (hyperlipidemia)   ? Hypertension   ? Long term (current) use of anticoagulants 12/29/2019  ? Malnutrition of moderate degree 05/09/2021  ? Peripheral arterial disease ([place])   ? Pneumonia due to COVID-19 virus 05/29/2021  ? ? ?Assessment: ?71 yo M presenting with dyspnea 2/2 COPD exacerbation. PMH significant for afib on PTA warfarin 2.'5mg'$  daily. Pharmacy consulted for warfarin  dosing - now on hold. R/LHC planned for today - pharmacy consulted to transition to IV heparin. ? ?Initial heparin level post-toe amputation 08/26/21 below goal: 0.26, no infusion issues or s/sx of bleeding reported - given previous therapeutic levels on this rate will increase conservatively ? ?Goal of Therapy:  ?Heparin level 0.3-0.7 units/ml ?INR 2-2.5 (decreased due to normocytic anemia in past) ?Monitor platelets by anticoagulation protocol: Yes ?  ?Plan:  ?Increase heparin infusion to 1600 units/hr  ?Continue to hold warfarin ?Heparin level in 8 hours ?Daily heparin level and CBC ?Monitor for s/s of bleeding ? ?Georga Bora, PharmD ?Clinical Pharmacist ?08/27/2021 2:52 AM ?Please check AMION for all [place] numbers ? ? ? ?

## 2021-08-27 NOTE — Progress Notes (Addendum)
?  Progress Note ? ? ? ?08/27/2021 ?7:35 AM ?1 Day Post-Op ? ?Subjective:  no complaints/denies any pain ? ?afebrile ? ?Vitals:  ? 08/27/21 0023 08/27/21 0408  ?BP: 125/72 133/61  ?Pulse: 75 75  ?Resp: 20 18  ?Temp: 98.5 ?F (36.9 ?C) (!) 97.5 ?F (36.4 ?C)  ?SpO2: 99% 100%  ? ? ?Physical Exam: ?General:  resting comfortably; wakes easily ?Lungs:  non labored ?Incisions:  clean with nylon sutures in place ? ? ? ?CBC ?   ?Component Value Date/Time  ? WBC 7.6 08/27/2021 0127  ? RBC 2.81 (L) 08/27/2021 0127  ? HGB 8.2 (L) 08/27/2021 0127  ? HGB 8.4 (L) 05/19/2021 1200  ? HCT 25.9 (L) 08/27/2021 0127  ? HCT 28.2 (L) 05/19/2021 1200  ? PLT 157 08/27/2021 0127  ? PLT 255 05/19/2021 1200  ? MCV 92.2 08/27/2021 0127  ? MCV 84 05/19/2021 1200  ? MCH 29.2 08/27/2021 0127  ? MCHC 31.7 08/27/2021 0127  ? RDW 15.4 08/27/2021 0127  ? RDW 16.2 (H) 05/19/2021 1200  ? LYMPHSABS 0.8 08/19/2021 0545  ? MONOABS 0.8 08/19/2021 0545  ? EOSABS 0.3 08/19/2021 0545  ? BASOSABS 0.1 08/19/2021 0545  ? ? ?BMET ?   ?Component Value Date/Time  ? NA 135 08/27/2021 0127  ? NA 141 05/19/2021 1200  ? K 4.0 08/27/2021 0127  ? CL 99 08/27/2021 0127  ? CO2 31 08/27/2021 0127  ? GLUCOSE 246 (H) 08/27/2021 0127  ? BUN 25 (H) 08/27/2021 0127  ? BUN 49 (H) 05/19/2021 1200  ? CREATININE 1.25 (H) 08/27/2021 0127  ? CREATININE 1.19 10/09/2015 1554  ? CALCIUM 7.9 (L) 08/27/2021 0127  ? GFRNONAA >60 08/27/2021 0127  ? GFRNONAA 65 10/09/2015 1554  ? GFRAA 52 (L) 05/01/2020 1632  ? GFRAA 75 10/09/2015 1554  ? ? ?INR ?   ?Component Value Date/Time  ? INR 1.3 (H) 08/27/2021 0127  ? ? ? ?Intake/Output Summary (Last 24 hours) at 08/27/2021 0735 ?Last data filed at 08/27/2021 0410 ?Gross per 24 hour  ?Intake 796.73 ml  ?Output 750 ml  ?Net 46.73 ml  ? ? ? ?Assessment/Plan:  71 y.o. male is s/p:  ?Right 2nd toe amputation  ?1 Day Post-Op ? ? ?-dressing removed and amp site looks good with nylon suture in place.  Dressing changes daily. ?-heel weight bearing only with darco  shoe ?-f/u in 3-4 weeks for wound check/suture removal. ? ? ? ?Leontine Locket, PA-C ?[facility] ?[phone] ?08/27/2021 ?7:35 AM ? ?I have seen and evaluated the patient. I agree with the PA note as documented above.  Postop day 1 status post right second toe amputation.  Site looks good this morning and dressing changed.  Heel weightbearing with Darco shoe.  Follow-up in about 3 weeks for suture removal in the office. ? ?Marty Heck, MD ?[facility] ?Office: [phone] ? ? ?

## 2021-08-27 NOTE — Progress Notes (Signed)
?Cardiology Progress Note  ?Patient ID: Rodney [name] ?MRN: [id number] ?DOB: 01-10-1951 ?Date of Encounter: 08/27/2021 ? ?Primary Cardiologist: Evalina Field, MD ? ?Subjective  ? ?Chief Complaint: None.  ? ?HPI: Status post right second toe amputation.  Doing well.  Plan for outpatient evaluation for TAVR. ? ?ROS:  ?All other ROS reviewed and negative. Pertinent positives noted in the HPI.    ? ?Inpatient Medications  ?Scheduled Meds: ? atorvastatin  80 mg Oral Daily  ? carvedilol  6.25 mg Oral BID WC  ? Chlorhexidine Gluconate Cloth  6 each Topical Q0600  ? clopidogrel  75 mg Oral Q breakfast  ? fidaxomicin  200 mg Oral BID  ? fluticasone furoate-vilanterol  1 puff Inhalation Daily  ? furosemide  40 mg Oral Daily  ? insulin aspart  0-20 Units Subcutaneous TID WC  ? insulin detemir  15 Units Subcutaneous QHS  ? ipratropium-albuterol  3 mL Nebulization BID  ? mupirocin ointment  1 application. Nasal BID  ? pantoprazole  40 mg Oral BID  ? sodium chloride flush  3 mL Intravenous Q12H  ? sodium chloride flush  3 mL Intravenous Q12H  ? umeclidinium bromide  1 puff Inhalation Daily  ? ?Continuous Infusions: ? sodium chloride    ? heparin 1,500 Units/hr (08/27/21 0258)  ? ?PRN Meds: ?sodium chloride, acetaminophen, ipratropium-albuterol, ondansetron (ZOFRAN) IV, sodium chloride flush, traZODone  ? ?Vital Signs  ? ?Vitals:  ? 08/26/21 1940 08/26/21 2041 08/27/21 0023 08/27/21 0408  ?BP: (!) 108/59  125/72 133/61  ?Pulse: 61  75 75  ?Resp: '16  20 18  '$ ?Temp: 98.5 ?F (36.9 ?C)  98.5 ?F (36.9 ?C) (!) 97.5 ?F (36.4 ?C)  ?TempSrc: Oral  Oral Oral  ?SpO2: 95% 100% 99% 100%  ?Weight:      ?Height:      ? ? ?Intake/Output Summary (Last 24 hours) at 08/27/2021 0738 ?Last data filed at 08/27/2021 0410 ?Gross per 24 hour  ?Intake 796.73 ml  ?Output 750 ml  ?Net 46.73 ml  ? ? ?  08/26/2021  ? 11:57 AM 08/26/2021  ?  4:32 AM 08/25/2021  ? 12:29 AM  ?Last 3 Weights  ?Weight (lbs) 199 lb 8.3 oz 199 lb 8.3 oz 211 lb 3.2 oz  ?Weight (kg) 90.5 kg  90.5 kg 95.8 kg  ?   ? ?Telemetry  ?Overnight telemetry shows A-fib heart rate 70s, which I personally reviewed.  ? ?Physical Exam  ? ?Vitals:  ? 08/26/21 1940 08/26/21 2041 08/27/21 0023 08/27/21 0408  ?BP: (!) 108/59  125/72 133/61  ?Pulse: 61  75 75  ?Resp: '16  20 18  '$ ?Temp: 98.5 ?F (36.9 ?C)  98.5 ?F (36.9 ?C) (!) 97.5 ?F (36.4 ?C)  ?TempSrc: Oral  Oral Oral  ?SpO2: 95% 100% 99% 100%  ?Weight:      ?Height:      ?  ?Intake/Output Summary (Last 24 hours) at 08/27/2021 0738 ?Last data filed at 08/27/2021 0410 ?Gross per 24 hour  ?Intake 796.73 ml  ?Output 750 ml  ?Net 46.73 ml  ?  ? ?  08/26/2021  ? 11:57 AM 08/26/2021  ?  4:32 AM 08/25/2021  ? 12:29 AM  ?Last 3 Weights  ?Weight (lbs) 199 lb 8.3 oz 199 lb 8.3 oz 211 lb 3.2 oz  ?Weight (kg) 90.5 kg 90.5 kg 95.8 kg  ?  Body mass index is 27.06 kg/m?.  ? ?General: Well nourished, well developed, in no acute distress ?Head: Atraumatic, normal size  ?Eyes:  PEERLA, EOMI  ?Neck: Supple, no JVD ?Endocrine: No thryomegaly ?Cardiac: Normal S1, S2; irregular rhythm, harsh 3 out of 6 systolic ejection murmur ?Lungs: Clear to auscultation bilaterally, no wheezing, rhonchi or rales  ?Abd: Soft, nontender, no hepatomegaly  ?Ext: Status post left BKA, status post right great and second toe amputation, 2+ radial pulse ?Musculoskeletal: No deformities, BUE and BLE strength normal and equal ?Skin: Warm and dry, no rashes   ?Neuro: Alert and oriented to person, place, time, and situation, CNII-XII grossly intact, no focal deficits  ?Psych: Normal mood and affect  ? ?Labs  ?High Sensitivity Troponin:   ?Recent Labs  ?Lab 08/19/21 ?8127 08/19/21 ?5170  ?TROPONINIHS 36* 40*  ?   ?Cardiac EnzymesNo results for input(s): TROPONINI in the last 168 hours. No results for input(s): TROPIPOC in the last 168 hours.  ?Chemistry ?Recent Labs  ?Lab 08/25/21 ?0134 08/25/21 ?1508 08/25/21 ?1518 08/26/21 ?0450 08/27/21 ?0127  ?NA 135   < > 137 134* 135  ?K 3.9   < > 3.5 3.3* 4.0  ?CL 96*  --   --  95* 99  ?CO2  33*  --   --  32 31  ?GLUCOSE 163*  --   --  169* 246*  ?BUN 34*  --   --  29* 25*  ?CREATININE 1.45*  --   --  1.36* 1.25*  ?CALCIUM 8.3*  --   --  8.0* 7.9*  ?GFRNONAA 52*  --   --  56* >60  ?ANIONGAP 6  --   --  7 5  ? < > = values in this interval not displayed.  ?  ?Hematology ?Recent Labs  ?Lab 08/25/21 ?0134 08/25/21 ?1508 08/25/21 ?1518 08/26/21 ?0450 08/27/21 ?0127  ?WBC 8.4  --   --  6.9 7.6  ?RBC 2.89*  --   --  3.01* 2.81*  ?HGB 8.6*   < > 9.5* 8.8* 8.2*  ?HCT 26.3*   < > 28.0* 27.3* 25.9*  ?MCV 91.0  --   --  90.7 92.2  ?MCH 29.8  --   --  29.2 29.2  ?MCHC 32.7  --   --  32.2 31.7  ?RDW 15.8*  --   --  15.6* 15.4  ?PLT 193  --   --  181 157  ? < > = values in this interval not displayed.  ? ?BNPNo results for input(s): BNP, PROBNP in the last 168 hours.  ?DDimer No results for input(s): DDIMER in the last 168 hours.  ? ?Radiology  ?CARDIAC CATHETERIZATION ? ?Result Date: 08/25/2021 ?  Mid LAD lesion is 50% stenosed.   Ost RCA to Prox RCA lesion is 70% stenosed.   Mid RCA to Dist RCA lesion is 100% stenosed.   Ost LAD to Prox LAD lesion is 20% stenosed.   Prox Cx to Mid Cx lesion is 60% stenosed.   1st Mrg lesion is 60% stenosed.   Hemodynamic findings consistent with moderate pulmonary hypertension.   There is moderate aortic valve stenosis. Multivessel CAD with coronary calcification.  The LAD has mild 20% proximal narrowing and then has diffuse 40 to 50% mid stenoses after the first diagonal vessel; the left circumflex vessel gives rise to a high marginal branch which bifurcates and extends to the apex and has diffuse 60% proximal to mid stenosis, the AV groove circumflex after OM1 is segmentally narrowed at 60%; the RCA has 70% proximal stenosis and then has previously noted chronic total occlusion.  There is faint collateralization  to a portion of the distal RCA from the conus branch and faint collateralization from the LAD to the PDA and PLA system. Moderate elevation of right heart pressures with  moderate pulmonary artery stenosis with mean PA pressure 35 mm. Calcified aortic valve with mean gradient of 34 mmHg, peak to peak 36 mm  Hg with at least moderate stenosis Severely calcified mitral annulus. RECOMMENDATION: The patient will be undergoing evaluation with the structural heart team for potential TAVR.  His warfarin has been held procedure for 3 days.  Anticoagulation will need to be instituted at this may need to be deferred until the patient is tentatively scheduled total amputation tomorrow.   ? ?Cardiac Studies  ?LHC 08/25/2021 ?  Mid LAD lesion is 50% stenosed. ?  Ost RCA to Prox RCA lesion is 70% stenosed. ?  Mid RCA to Dist RCA lesion is 100% stenosed. ?  Ost LAD to Prox LAD lesion is 20% stenosed. ?  Prox Cx to Mid Cx lesion is 60% stenosed. ?  1st Mrg lesion is 60% stenosed. ?  Hemodynamic findings consistent with moderate pulmonary hypertension. ?  There is moderate aortic valve stenosis. ? ?Patient Profile  ?Rodney [name] is a 71 y.o. male with COPD, severe stenosis, heart failure with reduced ejection fraction with recovery of ejection fraction, PAD status post left BKA, persistent atrial fibrillation on Coumadin, CKD stage III, CAD, diabetes who was admitted on 08/19/2021 for acute on chronic respiratory failure secondary COPD.  Cardiology was consulted for severe aortic stenosis. ? ?Assessment & Plan  ? ?#Severe aortic stenosis ?-Has completed TAVR work-up.  Plan for this as an outpatient.  Structural heart team to coordinate as an outpatient. ? ?#Systolic heart failure with recovered ejection fraction, 55 to 60% ?-EF has recovered. ?-Continue Lasix 40 mg daily at discharge. ?-Continue carvedilol. ?-I have added back losartan 12.5 mg daily.  Kidney function is stable. ?-Not a great candidate for SGLT2 inhibitor given PAD and leg amputations. ? ?#Persistent atrial fibrillation ?-On heparin drip.  Will transition back to Coumadin at the discretion of surgery. ?-Goal INR 2-3. ?-Rate  controlled on Coreg. ? ?#PAD ?-Status post left BKA.  Status post right femoropopliteal bypass with right SFA stent.  Stenting was done at the anastomotic graft site. ?-Doing well after right second toe amputat

## 2021-08-27 NOTE — Progress Notes (Signed)
Physical Therapy Treatment ?Patient Details ?Name: Rodney [name] ?MRN: [id number] ?DOB: Sep 19, 1950 ?Today's Date: 08/27/2021 ? ? ?History of Present Illness Patient is a 71 yo male presenting to the ED with SOB on 08/19/21. Admitted same day with acute respiratory failure and COPD exacerbation. Patient with 4 admissions within the last 6 months. 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  PMH is significant for DM 2, obesity, tobacco abuse, hypertension, protein calorie malnutrition, aortic stenosis, anemia chronic disease, PVD, HFrEF, PAF, status post BKA, CAD, CKD stage III, COPD, COVID+ ? ?  ?PT Comments  ? ? Pt with slow progression towards goals. Reports he did not want to perform mobility tasks, however, was agreeable to supine exercises. Educated about new precautions given recent surgery. Pt reports his girlfriend is in the hospital and will likely not be able to care for him. Updated recommendations to SNF at d/c given decline in function. Will continue to follow acutely.    ?Recommendations for follow up therapy are one component of a multi-disciplinary discharge planning process, led by the attending physician.  Recommendations may be updated based on patient status, additional functional criteria and insurance authorization. ? ?Follow Up Recommendations ? Skilled nursing-short term rehab (<3 hours/day) ?  ?  ?Assistance Recommended at Discharge Frequent or constant Supervision/Assistance  ?Patient can return home with the following A lot of help with walking and/or transfers;A little help with bathing/dressing/bathroom;Assistance with cooking/housework;Assistance with feeding;Direct supervision/assist for medications management;Direct supervision/assist for financial management;Assist for transportation;Help with stairs or ramp for entrance ?  ?Equipment Recommendations ? None recommended by PT  ?  ?Recommendations for Other Services   ? ? ?  ?Precautions / Restrictions Precautions ?Precautions:  Fall ?Precaution Comments: L BKA ?Required Braces or Orthoses: Other Brace ?Other Brace: Needs darco shoe ?Restrictions ?Weight Bearing Restrictions: Yes ?RLE Weight Bearing:  (heel weightbearing only in Darco shoe) ?Other Position/Activity Restrictions: heel weightbearing in darco shoe on the R  ?  ? ?Mobility ? Bed Mobility ?  ?  ?  ?  ?  ?  ?  ?General bed mobility comments: Pt refusing bed mobility, but was somewhat agreeable to ther ex. ?  ? ?Transfers ?  ?  ?  ?  ?  ?  ?  ?  ?  ?  ?  ? ?Ambulation/Gait ?  ?  ?  ?  ?  ?  ?  ?  ? ? ?Stairs ?  ?  ?  ?  ?  ? ? ?Wheelchair Mobility ?  ? ?Modified Rankin (Stroke Patients Only) ?  ? ? ?  ?Balance   ?  ?  ?  ?  ?  ?  ?  ?  ?  ?  ?  ?  ?  ?  ?  ?  ?  ?  ?  ? ?  ?Cognition Arousal/Alertness: Awake/alert ?Behavior During Therapy: Flat affect, Agitated ?Overall Cognitive Status: No family/caregiver present to determine baseline cognitive functioning ?  ?  ?  ?  ?  ?  ?  ?  ?  ?  ?  ?  ?  ?  ?  ?  ?General Comments: Easily agitated with PT presence. No family present ?  ?  ? ?  ?Exercises General Exercises - Upper Extremity ?Shoulder Flexion: AROM, Both, 10 reps ?General Exercises - Lower Extremity ?Quad Sets: AROM, Left, 10 reps ?Heel Slides: AROM, Right, 10 reps ?Straight Leg Raises: AROM, Both, 10 reps ? ?  ?General Comments   ?  ?  ? ?  Pertinent Vitals/Pain Pain Assessment ?Pain Assessment: 0-10 ?Pain Score: 8  ?Pain Location: R foot ?Pain Descriptors / Indicators: Grimacing, Guarding ?Pain Intervention(s): Monitored during session, Repositioned, Limited activity within patient's tolerance  ? ? ?Home Living   ?  ?  ?  ?  ?  ?  ?  ?  ?  ?   ?  ?Prior Function    ?  ?  ?   ? ?PT Goals (current goals can now be found in the care plan section) Acute Rehab PT Goals ?Patient Stated Goal: to go home ?PT Goal Formulation: With patient ?Time For Goal Achievement: 09/10/21 ?Potential to Achieve Goals: Fair ?Progress towards PT goals: Progressing toward goals (very slowly) ? ?   ?Frequency ? ? ? Min 3X/week ? ? ? ?  ?PT Plan Discharge plan needs to be updated  ? ? ?Co-evaluation   ?  ?  ?  ?  ? ?  ?AM-PAC PT "6 Clicks" Mobility   ?Outcome Measure ? Help needed turning from your back to your side while in a flat bed without using bedrails?: None ?Help needed moving from lying on your back to sitting on the side of a flat bed without using bedrails?: A Little ?Help needed moving to and from a bed to a chair (including a wheelchair)?: A Little ?Help needed standing up from a chair using your arms (e.g., wheelchair or bedside chair)?: Total ?Help needed to walk in hospital room?: Total ?Help needed climbing 3-5 steps with a railing? : Total ?6 Click Score: 13 ? ?  ?End of Session Equipment Utilized During Treatment: Oxygen ?Activity Tolerance: Patient limited by fatigue ?Patient left: in bed;with call bell/phone within reach;with bed alarm set ?Nurse Communication: Mobility status ?PT Visit Diagnosis: Muscle weakness (generalized) (M62.81);Other abnormalities of gait and mobility (R26.89) ?  ? ? ?Time: 6834-1962 ?PT Time Calculation (min) (ACUTE ONLY): 10 min ? ?Charges:  $Therapeutic Exercise: 8-22 mins          ?          ? ?Reuel Derby, PT, DPT  ?Acute Rehabilitation Services  ?Pager: [phone] ?Office: [phone] ? ? ? ?[place] ?08/27/2021, 1:50 PM ? ?

## 2021-08-27 NOTE — TOC Progression Note (Signed)
Transition of Care (TOC) - Progression Note  ? ? ?Patient Details  ?Name: Rodney [name] ?MRN: [id number] ?Date of Birth: 06/20/1950 ? ?Transition of Care (TOC) CM/SW Contact  ?Zenon Mayo, RN ?Phone Number: ?08/27/2021, 4:12 PM ? ?Clinical Narrative:    ?Patient is s/p r 2nd toe amputation, per pt eval today rec is for SNF, CSW aware.  Patient is active with [facility] as well .  TOC will continue to follow for dc needs.  ? ? ?Expected Discharge Plan: [place] ?Barriers to Discharge: Continued Medical Work up ? ?Expected Discharge Plan and Services ?Expected Discharge Plan: [place] ?  ?Discharge Planning Services: CM Consult ?Post Acute Care Choice: Home Health ?Living arrangements for the past 2 months: [place] ?                ?  ?DME Agency: NA ?  ?  ?  ?HH Arranged: RN, PT, OT ?[place] Agency: [place] ?Date HH Agency Contacted: 08/20/21 ?Time [place]: [id number] ?Representative spoke with at [place]: Tommi Rumps ? ? ?Social Determinants of Health (SDOH) Interventions ?  ? ?Readmission Risk Interventions ? ?  08/21/2021  ?  5:10 PM 06/26/2021  ?  4:36 PM 05/16/2021  ?  3:13 PM  ?Readmission Risk Prevention Plan  ?Transportation Screening Complete Complete Complete  ?Medication Review Press photographer) Complete Complete Complete  ?PCP or Specialist appointment within 3-5 days of discharge Complete Complete Complete  ?[place] or Home Care Consult Complete Complete Complete  ?SW Recovery Care/Counseling Consult Complete Complete Complete  ?Palliative Care Screening Not Applicable Not Applicable Not Applicable  ?[place] Not Applicable Complete Not Applicable  ? ? ?

## 2021-08-27 NOTE — Care Management Important Message (Signed)
Important Message ? ?Patient Details  ?Name: Rodney [name] ?MRN: [id number] ?Date of Birth: Oct 17, 1950 ? ? ?Medicare Important Message Given:  Yes ? ? ? ? ?Shelda Altes ?08/27/2021, 8:41 AM ?

## 2021-08-27 NOTE — Anesthesia Postprocedure Evaluation (Signed)
Anesthesia Post Note ? ?Patient: LORON WEIMER ? ?Procedure(s) Performed: AMPUTATION 2nd TOE (Right: Toe) ? ?  ? ?Patient location during evaluation: PACU ?Anesthesia Type: MAC ?Level of consciousness: awake and alert ?Pain management: pain level controlled ?Vital Signs Assessment: post-procedure vital signs reviewed and stable ?Respiratory status: spontaneous breathing, nonlabored ventilation, respiratory function stable and patient connected to nasal cannula oxygen ?Cardiovascular status: stable and blood pressure returned to baseline ?Postop Assessment: no apparent nausea or vomiting ?Anesthetic complications: no ? ? ?No notable events documented. ? ?Last Vitals:  ?Vitals:  ? 08/27/21 0023 08/27/21 0408  ?BP: 125/72 133/61  ?Pulse: 75 75  ?Resp: 20 18  ?Temp: 36.9 ?C (!) 36.4 ?C  ?SpO2: 99% 100%  ?  ?Last Pain:  ?Vitals:  ? 08/27/21 0408  ?TempSrc: Oral  ?PainSc:   ? ? ?  ?  ?  ?  ?  ?  ? ?[place] ? ? ? ? ?

## 2021-08-27 NOTE — Progress Notes (Signed)
ANTICOAGULATION CONSULT NOTE - Follow-Up Consult ? ?Pharmacy Consult for warfarin to IV heparin  ?Indication: atrial fibrillation and hx DVT ? ?No Known Allergies ? ?Patient Measurements: ?Height: 6' (182.9 cm) ?Weight: 90.5 kg (199 lb 8.3 oz) ?IBW/kg (Calculated) : 77.6 ?Heparin dosing weight: 96 kg ? ?Vital Signs: ?Temp: 97.9 ?F (36.6 ?C) (05/03 0818) ?Temp Source: Oral (05/03 0818) ?BP: 125/67 (05/03 1032) ?Pulse Rate: 74 (05/03 1032) ? ?Labs: ?Recent Labs  ?  08/25/21 ?0134 08/25/21 ?1508 08/25/21 ?1518 08/26/21 ?0450 08/27/21 ?0127 08/27/21 ?1059  ?HGB 8.6*   < > 9.5* 8.8* 8.2*  --   ?HCT 26.3*   < > 28.0* 27.3* 25.9*  --   ?PLT 193  --   --  181 157  --   ?LABPROT 15.4*  --   --  13.6 15.7*  --   ?INR 1.2  --   --  1.1 1.3*  --   ?HEPARINUNFRC 0.37  --   --   --  0.26* 0.35  ?CREATININE 1.45*  --   --  1.36* 1.25*  --   ? < > = values in this interval not displayed.  ? ? ? ?Estimated Creatinine Clearance: 61.2 mL/min (A) (by C-G formula based on SCr of 1.25 mg/dL (H)). ? ? ?Medical History: ?Past Medical History:  ?Diagnosis Date  ? Acute combined systolic and diastolic congestive heart failure ([place])   ? Acute on chronic heart failure with preserved ejection fraction (HFpEF) ([place])   ? Acute respiratory failure with hypoxia ([place])   ? Acute respiratory failure with hypoxia ([place])   ? Aortic stenosis   ? moderate in 2022  ? Atrial fibrillation ([place])   ? CHF (congestive heart failure) ([place])   ? Chronic pleural effusion, Left 11/16/2020  ? COPD exacerbation ([place]) 12/09/2020  ? Coronary artery disease   ? Demand ischemia ([place]) 03/26/2021  ? Diabetes mellitus without complication ([place])   ? HLD (hyperlipidemia)   ? Hypertension   ? Long term (current) use of anticoagulants 12/29/2019  ? Malnutrition of moderate degree 05/09/2021  ? Peripheral arterial disease ([place])   ? Pneumonia due to COVID-19 virus 05/29/2021  ? ? ?Assessment: ?71 yo M presenting with dyspnea 2/2 COPD exacerbation. PMH significant for afib on PTA  warfarin 2.'5mg'$  daily. Pharmacy consulted for warfarin dosing - now on hold pending procedures.  Pt s/p R/LHC (5/1) and 2nd toe amputation (5/2).  Primary team has consulted oral surgery for possible dental extraction also this admission.  Pt continues on IV heparin while warfarin on hold.  Heparin level therapeutic on 1600 units/hr.   ? ?Goal of Therapy:  ?Heparin level 0.3-0.7 units/ml ?INR 2-2.5 (decreased due to normocytic anemia in past) ?Monitor platelets by anticoagulation protocol: Yes ?  ?Plan:  ?Continue heparin at 1600 units/hr  ?Continue to hold warfarin ?Daily heparin level and CBC ?Monitor for s/s of bleeding ? ? ?[employer], Pharm.D., BCPS ?Clinical Pharmacist ? ?**Pharmacist phone directory can be found on [email or web address] listed under [place]. ? ?08/27/2021 1:06 PM ? ?

## 2021-08-27 NOTE — Progress Notes (Signed)
FPTS Brief Progress Note ? ?S:Went to see patient with Dr. Chauncey Reading. Patient sleeping comfortably and easily awoken. No complaints at this time, foot reported to be doing well.  ? ? ?O: ?BP 125/72 (BP Location: Left Arm)   Pulse 75   Temp 98.5 ?F (36.9 ?C) (Oral)   Resp 20   Ht 6' (1.829 m)   Wt 90.5 kg   SpO2 99%   BMI 27.06 kg/m?   ?General: Comfortable, NAD, White male ?Foot: Bandaged with dry, clean, intact dressing, pain well controlled. ? ?A/P: ?-Plans per day team ?- Orders reviewed. Labs for AM ordered, which was adjusted as needed.  ? ? ?Holley Bouche, MD ?08/27/2021, 2:37 AM ?PGY-1, [place] [facility] Night Resident  ?Please page [phone] with questions.  ? ? ?

## 2021-08-27 NOTE — Progress Notes (Signed)
Occupational Therapy Treatment ?Patient Details ?Name: Rodney [name] ?MRN: [id number] ?DOB: 1951/02/27 ?Today's Date: 08/27/2021 ? ? ?History of present illness Patient is a 71 yo male presenting to the ED with SOB on 08/19/21. Admitted same day with acute respiratory failure and COPD exacerbation. Patient with 4 admissions within the last 6 months. 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  PMH is significant for DM 2, obesity, tobacco abuse, hypertension, protein calorie malnutrition, aortic stenosis, anemia chronic disease, PVD, HFrEF, PAF, status post BKA, CAD, CKD stage III, COPD, COVID+ ?  ?OT comments ? Patient with significant flat affect to date, with decreased participation throughout session. Patient with no recall of heart cath on 5/1 or acknowledgment of precautions for RLE. Patient minimally agreeable to bed level ADLs, and refusing all attempts at EOB or OOB movement. OT offering to have chaplain or someone come speak to patient with patient stating, "You've got to be in the right mood for company, and Im not in the right mood." (RN notified of decreased mood and affect after session) Of note, patient's girlfriend is now in the hospital which may be causing patient's change in affect. Discharge recommendation downgraded to SNF as patient relied heavily on girlfriend at home for ADLs and IADLs. OT will continue to follow acutely.   ? ?Recommendations for follow up therapy are one component of a multi-disciplinary discharge planning process, led by the attending physician.  Recommendations may be updated based on patient status, additional functional criteria and insurance authorization. ?   ?Follow Up Recommendations ? Skilled nursing-short term rehab (<3 hours/day)  ?  ?Assistance Recommended at Discharge Frequent or constant Supervision/Assistance  ?Patient can return home with the following ? A little help with walking and/or transfers;A little help with bathing/dressing/bathroom;Assistance with  cooking/housework;Assist for transportation;Help with stairs or ramp for entrance ?  ?Equipment Recommendations ? None recommended by OT (patient has DME needed)  ?  ?Recommendations for Other Services   ? ?  ?Precautions / Restrictions Precautions ?Precautions: Fall ?Precaution Comments: L BKA ?Required Braces or Orthoses: Other Brace ?Other Brace: Needs darco shoe ?Restrictions ?Weight Bearing Restrictions: Yes ?RLE Weight Bearing:  (heel weightbearing only in Darco shoe) ?Other Position/Activity Restrictions: heel weightbearing in darco shoe on the R  ? ? ?  ? ?Mobility Bed Mobility ?Overal bed mobility: Needs Assistance ?Bed Mobility: Rolling ?Rolling: Independent ?  ?  ?  ?  ?General bed mobility comments: Refusing to sit EOB, completing minimal bed level ADLs ?  ? ?Transfers ?  ?  ?  ?  ?  ?  ?  ?  ?  ?General transfer comment: refusing ?  ?  ?Balance   ?  ?  ?  ?  ?  ?  ?  ?  ?  ?  ?  ?  ?  ?  ?  ?  ?  ?  ?   ? ?ADL either performed or assessed with clinical judgement  ? ?ADL   ?Eating/Feeding: Bed level;Set up ?Eating/Feeding Details (indicate cue type and reason): refusing to sit EOB to eat, placing plate in bed and eating laying on his L side ?Grooming: Bed level;Wash/dry hands;Set up ?  ?  ?  ?  ?  ?  ?  ?  ?  ?  ?  ?  ?  ?  ?  ?Functional mobility during ADLs: Moderate assistance ?General ADL Comments: Patient more flat and with decrease in participation to date, increased confusion noted ?  ? ?  Extremity/Trunk Assessment   ?  ?  ?  ?  ?  ? ?Vision   ?  ?  ?Perception   ?  ?Praxis   ?  ? ?Cognition Arousal/Alertness: Awake/alert ?Behavior During Therapy: Flat affect ?Overall Cognitive Status: No family/caregiver present to determine baseline cognitive functioning ?  ?  ?  ?  ?  ?  ?  ?  ?  ?  ?  ?  ?  ?  ?  ?  ?General Comments: Flat and frustrated in session, minimally participatory, no recall of heart cath completed on 5/1 or restrictions, did not state precautions for foot ?  ?  ?   ?Exercises   ? ?   ?Shoulder Instructions   ? ? ?  ?General Comments    ? ? ?Pertinent Vitals/ Pain       Pain Assessment ?Pain Assessment: Faces ?Faces Pain Scale: Hurts a little bit ?Pain Location: R foot ?Pain Descriptors / Indicators: Grimacing, Guarding ?Pain Intervention(s): Limited activity within patient's tolerance, Monitored during session, Repositioned ? ?Home Living   ?  ?  ?  ?  ?  ?  ?  ?  ?  ?  ?  ?  ?  ?  ?  ?  ?  ?  ? ?  ?Prior Functioning/Environment    ?  ?  ?  ?   ? ?Frequency ? Min 2X/week  ? ? ? ? ?  ?Progress Toward Goals ? ?OT Goals(current goals can now be found in the care plan section) ? Progress towards OT goals: Not progressing toward goals - comment (downgraded plan of care to SNF) ? ?Acute Rehab OT Goals ?Patient Stated Goal: declining to state ?OT Goal Formulation: With patient ?Time For Goal Achievement: 09/03/21 ?Potential to Achieve Goals: Fair  ?Plan Discharge plan needs to be updated   ? ?Co-evaluation ? ? ?   ?  ?  ?  ?  ? ?  ?AM-PAC OT "6 Clicks" Daily Activity     ?Outcome Measure ? ? Help from another person eating meals?: A Little ?Help from another person taking care of personal grooming?: A Little ?Help from another person toileting, which includes using toliet, bedpan, or urinal?: A Lot ?Help from another person bathing (including washing, rinsing, drying)?: A Lot ?Help from another person to put on and taking off regular upper body clothing?: A Little ?Help from another person to put on and taking off regular lower body clothing?: A Lot ?6 Click Score: 15 ? ?  ?End of Session Equipment Utilized During Treatment: Oxygen ? ?OT Visit Diagnosis: Muscle weakness (generalized) (M62.81) ?  ?Activity Tolerance Patient limited by fatigue;Treatment limited secondary to agitation;Patient limited by pain ?  ?Patient Left in bed;with call bell/phone within reach ?  ?Nurse Communication Mobility status;Other (comment) (appears to be getting confused and demonstrating signs of depression) ?  ? ?    ? ?Time: 1275-1700 ?OT Time Calculation (min): 14 min ? ?Charges: OT General Charges ?$OT Visit: 1 Visit ?OT Treatments ?$Self Care/Home Management : 8-22 mins ? ?Corinne Ports E. Shanette Tamargo, OTR/L ?Acute Rehabilitation Services ?[phone] ?[phone]  ? ?Corinne Ports Keymani Glynn ?08/27/2021, 3:35 PM ?

## 2021-08-27 NOTE — Progress Notes (Signed)
Family Medicine Teaching Service ?Daily Progress Note ?Intern Pager: [phone] ? ?Patient name: Rodney [name] Medical record number: [id number] ?Date of birth: [birth year] Age: 71 y.o. Gender: male ? ?Primary Care Provider: [name], MD ?Consultants: Cardiology, vascular surgery ?Code Status: Full code ? ?Pt Overview and Major Events to Date:  ?4/25-admitted, placed on BiPAP briefly then weaned to nasal cannula, started cefepime and given IV methylprednisolone ?4/26-transitioned steroids to oral prednisone, transfused 1u PRBC ?4/27-cefepime transitioned to cefdinir ?4/29-completed course of cefdinir and prednisone ?5/1-B/l heart cath  ?5/2-Right toe amputation ?  ?Assessment and Plan: ?Rodney [name] is a 71-year initially admitted for COPD exacerbation (now resolved).  Now experiencing C. difficile recurrence and undergoing preop preparation for TAVR, which includes L+RHC and amputation of gangrenous toe. PMHx significant for: COPD, HFrEF, severe AS, CAD, PAD s/p left BKA, PAF on warfarin, T2DM, CKD stage III, anemia of chronic disease, protein calorie malnutrition. ?  ?C. difficile recurrence, improving ?BM x0, no abdominal pain ?- Continued Pradaxa mycin 200 mg BID x10d, end date 09/02/21  ? ?HFpEF 2/2 Severe AS & CAD s/p L&R Heart Cath (5/1) ?Unclear dry weight, baseline 3L O2 [place]. Euvolemic. Currently on home dose Lasix. K and Cr remains stable. Possible teeth extraction. ?- Cardiology and oral surgeon consulted appreciate assistance and recs ?- Strict I/Os and daily weights ?- BMP and Mg2+ daily ?- K+ >4, Mg2+ >2, replete as needed ?- Restarted PO lasix 40 mg after amputation ?- TAVR preop: right toe amputation 5/2, maybe dental extraction ?- Possible teeth extraction per oral surgery ?- Losartan, coreg ? ?Gangrene of the right second toe 2/2 PAD s/p left BKA and right great toe amputation 5/2 (08/26/2021) ?- Coreg per below ?- plavix ? ?pAF, rate controlled and AC ?Normotensive BP. ?- Continued Coreg 6.25  mg BID  ?- Continued heparin until dc for procedures, then bridge to warfarin ? ?Anemia of CKD and iron deficiency ?Holding IV iron currently due to recent toe amputation. Would like to provide IV Fe to patient after amputation and before dc if possible. Hb stable. ?- Transfusion if Hb <8 ? ?Acute on chronic hypoxemic respiratory failure secondary to COPD exacerbation; resolved ?Baseline 3L [place] currently. Completed prednisone and cefdinir x5d 4/30. ?- Duo nebs BID ?- Continued Breo Ellipta daily ?- O2 goal: 88-92% ? ?CKD III, improving and stable ?Stable, at baseline Cr. 1.33 to Cr (!) 1.25  ?- Labs per above ? ?T2DM, controlled ?- Continued Levemir 15u daily ?- mSSI ?   ?Mediastinal lymph nodes ?- Outpatient PET follow-up ?  ?GERD ?- Protonix 40 mg ?  ?Insomnia ?- Trazodone nightly as needed ? ?De-Conditioning ?- OOB order ?- encourage mobilization ? ?FEN/GI: n.p.o. for heart cath  ?PPx: Protonix ?Dispo:Pending PT recommendations  pending clinical improvement . Barriers include TAVR ? ?Subjective:  ?No family at bedside. ? ?Patient was resting comfortably in bed, no distress. 0 BM yesterday, no pain. No other concerns.  ? ?Objective: ?Temp:  [97.5 ?F (36.4 ?C)-98.5 ?F (36.9 ?C)] 97.9 ?F (36.6 ?C) (05/03 0818) ?Pulse Rate:  [54-75] 74 (05/03 1032) ?Resp:  [15-21] 15 (05/03 1032) ?BP: (105-144)/(59-73) 125/67 (05/03 1032) ?SpO2:  [86 %-100 %] 100 % (05/03 1032) ?Physical Exam: ?General: Awake, alert, no acute distress ?Cardiovascular: murmur ?Respiratory: CTAB. Normal breathing efforts.  ?Abdomen: Soft, nondistended, nontender ? ?Laboratory: ?Recent Labs  ?Lab 08/25/21 ?0134 08/25/21 ?1508 08/25/21 ?1518 08/26/21 ?0450 08/27/21 ?0127  ?WBC 8.4  --   --  6.9 7.6  ?HGB 8.6*   < >  9.5* 8.8* 8.2*  ?HCT 26.3*   < > 28.0* 27.3* 25.9*  ?PLT 193  --   --  181 157  ? < > = values in this interval not displayed.  ? ?Recent Labs  ?Lab 08/25/21 ?0134 08/25/21 ?1508 08/25/21 ?1518 08/26/21 ?0450 08/27/21 ?0127  ?NA 135   < > 137  134* 135  ?K 3.9   < > 3.5 3.3* 4.0  ?CL 96*  --   --  95* 99  ?CO2 33*  --   --  32 31  ?BUN 34*  --   --  29* 25*  ?CREATININE 1.45*  --   --  1.36* 1.25*  ?CALCIUM 8.3*  --   --  8.0* 7.9*  ?GLUCOSE 163*  --   --  169* 246*  ? < > = values in this interval not displayed.  ? ?Imaging/Diagnostic Tests: ?No results found.  ? ?Merrily Brittle, DO ?08/27/2021, 12:30 PM ?PGY-1, [facility] ?[place] Intern pager: [phone], text pages welcome ?

## 2021-08-28 DIAGNOSIS — J432 Centrilobular emphysema: Secondary | ICD-10-CM | POA: Diagnosis not present

## 2021-08-28 DIAGNOSIS — J90 Pleural effusion, not elsewhere classified: Secondary | ICD-10-CM | POA: Diagnosis not present

## 2021-08-28 DIAGNOSIS — I480 Paroxysmal atrial fibrillation: Secondary | ICD-10-CM | POA: Diagnosis not present

## 2021-08-28 DIAGNOSIS — J9611 Chronic respiratory failure with hypoxia: Secondary | ICD-10-CM | POA: Diagnosis not present

## 2021-08-28 LAB — GLUCOSE, CAPILLARY
Glucose-Capillary: 118 mg/dL — ABNORMAL HIGH (ref 70–99)
Glucose-Capillary: 163 mg/dL — ABNORMAL HIGH (ref 70–99)
Glucose-Capillary: 167 mg/dL — ABNORMAL HIGH (ref 70–99)
Glucose-Capillary: 167 mg/dL — ABNORMAL HIGH (ref 70–99)

## 2021-08-28 LAB — CBC
HCT: 25.6 % — ABNORMAL LOW (ref 39.0–52.0)
Hemoglobin: 8.2 g/dL — ABNORMAL LOW (ref 13.0–17.0)
MCH: 29.4 pg (ref 26.0–34.0)
MCHC: 32 g/dL (ref 30.0–36.0)
MCV: 91.8 fL (ref 80.0–100.0)
Platelets: 147 10*3/uL — ABNORMAL LOW (ref 150–400)
RBC: 2.79 MIL/uL — ABNORMAL LOW (ref 4.22–5.81)
RDW: 15.6 % — ABNORMAL HIGH (ref 11.5–15.5)
WBC: 6.5 10*3/uL (ref 4.0–10.5)
nRBC: 0 % (ref 0.0–0.2)

## 2021-08-28 LAB — HEPARIN LEVEL (UNFRACTIONATED): Heparin Unfractionated: 0.46 IU/mL (ref 0.30–0.70)

## 2021-08-28 LAB — PROTIME-INR
INR: 1.1 (ref 0.8–1.2)
Prothrombin Time: 14.3 seconds (ref 11.4–15.2)

## 2021-08-28 MED ORDER — SODIUM CHLORIDE 0.9 % IV SOLN
250.0000 mg | Freq: Every day | INTRAVENOUS | Status: AC
  Administered 2021-08-28 – 2021-08-29 (×2): 250 mg via INTRAVENOUS
  Filled 2021-08-28 (×2): qty 20

## 2021-08-28 MED ORDER — TRAZODONE HCL 50 MG PO TABS
25.0000 mg | ORAL_TABLET | Freq: Every day | ORAL | Status: DC
  Administered 2021-08-28 – 2021-09-02 (×6): 50 mg via ORAL
  Filled 2021-08-28 (×6): qty 1

## 2021-08-28 NOTE — Progress Notes (Addendum)
ANTICOAGULATION CONSULT NOTE - Follow-Up Consult ? ?Pharmacy Consult for warfarin to IV heparin  ?Indication: atrial fibrillation and hx DVT ? ?No Known Allergies ? ?Patient Measurements: ?Height: 6' (182.9 cm) ?Weight: 93.9 kg (207 lb 0.2 oz) ?IBW/kg (Calculated) : 77.6 ?Heparin dosing weight: 94 kg ? ?Vital Signs: ?Temp: 98.1 ?F (36.7 ?C) (05/04 6160) ?Temp Source: Oral (05/04 7371) ?BP: 141/74 (05/04 0929) ?Pulse Rate: 75 (05/04 0929) ? ?Labs: ?Recent Labs  ?  08/26/21 ?0450 08/27/21 ?0127 08/27/21 ?1059 08/28/21 ?0242  ?HGB 8.8* 8.2*  --  8.2*  ?HCT 27.3* 25.9*  --  25.6*  ?PLT 181 157  --  147*  ?LABPROT 13.6 15.7*  --  14.3  ?INR 1.1 1.3*  --  1.1  ?HEPARINUNFRC  --  0.26* 0.35 0.46  ?CREATININE 1.36* 1.25*  --   --   ? ? ? ?Estimated Creatinine Clearance: 66.3 mL/min (A) (by C-G formula based on SCr of 1.25 mg/dL (H)). ? ? ?Medical History: ?Past Medical History:  ?Diagnosis Date  ? Acute combined systolic and diastolic congestive heart failure ([place])   ? Acute on chronic heart failure with preserved ejection fraction (HFpEF) ([place])   ? Acute respiratory failure with hypoxia ([place])   ? Acute respiratory failure with hypoxia ([place])   ? Aortic stenosis   ? moderate in 2022  ? Atrial fibrillation ([place])   ? CHF (congestive heart failure) ([place])   ? Chronic pleural effusion, Left 11/16/2020  ? COPD exacerbation ([place]) 12/09/2020  ? Coronary artery disease   ? Demand ischemia ([place]) 03/26/2021  ? Diabetes mellitus without complication ([place])   ? HLD (hyperlipidemia)   ? Hypertension   ? Long term (current) use of anticoagulants 12/29/2019  ? Malnutrition of moderate degree 05/09/2021  ? Peripheral arterial disease ([place])   ? Pneumonia due to COVID-19 virus 05/29/2021  ? ? ?Assessment: ?71 yo M presenting with dyspnea 2/2 COPD exacerbation. PMH significant for afib on PTA warfarin 2.'5mg'$  daily. Pharmacy consulted for warfarin dosing - now on hold pending procedures.  Pt s/p R/LHC (5/1) and 2nd toe amputation (5/2).  Primary  team has consulted oral surgery for possible dental extraction also this admission.  Pt continues on IV heparin while warfarin on hold.  Heparin level therapeutic at 0.46 on 1600 units/hr. Hemoglobin and platelets stable, no signs of bleeding noted.  ? ?Goal of Therapy:  ?Heparin level 0.3-0.7 units/ml ?INR 2-2.5 (decreased due to normocytic anemia in past) ?Monitor platelets by anticoagulation protocol: Yes ?  ?Plan:  ?Continue heparin at 1600 units/hr  ?Continue to hold warfarin ?Daily heparin level and CBC ?Monitor for s/s of bleeding ? ? ?Inis Sizer, PharmD Candidate  ? ?**Pharmacist phone directory can be found on [email or web address] listed under [place]. ? ?08/28/2021 1:12 PM ? ?

## 2021-08-28 NOTE — Progress Notes (Signed)
FPTS Brief Progress Note ? ?S:Went to bedside to see patient, patient sleeping comfortably. Did not disturb.  ? ? ?O: ?BP 135/79 (BP Location: Left Arm)   Pulse 73   Temp 98.3 ?F (36.8 ?C) (Oral)   Resp 17   Ht 6' (1.829 m)   Wt 94.9 kg   SpO2 100%   BMI 28.37 kg/m?   ? ? ?A/P: ?- Plans per day team ?- Orders reviewed. Labs for AM ordered, which was adjusted as needed.  ?  ? ?Holley Bouche, MD ?08/28/2021, 2:39 AM ?PGY-1, [facility] Night Resident  ?Please page [phone] with questions.  ? ? ?

## 2021-08-28 NOTE — TOC Initial Note (Signed)
Transition of Care (TOC) - Initial/Assessment Note  ? ? ?Patient Details  ?Name: Rodney [name] ?MRN: [id number] ?Date of Birth: Dec 16, 1950 ? ?Transition of Care (TOC) CM/SW Contact:    ?Coralee Pesa, LCSWA ?Phone Number: ?08/28/2021, 4:22 PM ? ?Clinical Narrative:                 ?CSW spoke with pt to discuss SNF recommendation. Pt states he can hear CSW, but can't understand what CSW is trying to tell him. He requested CSW call [name], his girlfriend. CSW spoke with [name] who confirmed interest in SNF and stated he had been to [facility] before. She would like for him to return [facility] as [facility] is close enough she can visit. CSW spoke with [facility] who noted they have a bed available when ready. Pt is managed by Candace Cruise will need to be started closer to DC. CSW unable to follow up with pt to make sure he is agreeable to plan. TOC will continue to follow for DC needs. ? ?Expected Discharge Plan: [place] ?Barriers to Discharge: Continued Medical Work up, Ship broker ? ? ?Patient Goals and CMS Choice ?Patient states their goals for this hospitalization and ongoing recovery are:: Pt wants to be able to return home. ?CMS Medicare.gov Compare Post Acute Care list provided to:: Patient ?Choice offered to / list presented to : Patient, Spouse ? ?Expected Discharge Plan and Services ?Expected Discharge Plan: [place] ?  ?Discharge Planning Services: CM Consult ?Post Acute Care Choice: [place] ?Living arrangements for the past 2 months: [place] ?                ?  ?DME Agency: NA ?  ?  ?  ?HH Arranged: RN, PT, OT ?[place] Agency: [place] ?Date HH Agency Contacted: 08/20/21 ?Time [place]: 8937 ?Representative spoke with at [place]: Tommi Rumps ? ?Prior Living Arrangements/Services ?Living arrangements for the past 2 months: [place] ?Lives with:: Significant Other ?Patient language and need for interpreter reviewed:: Yes ?Do you  feel safe going back to the place where you live?: Yes      ?Need for Family Participation in Patient Care: Yes (Comment) ?Care giver support system in place?: Yes (comment) ?  ?Criminal Activity/Legal Involvement Pertinent to Current Situation/Hospitalization: No - Comment as needed ? ?Activities of Daily Living ?Home Assistive Devices/Equipment: Wheelchair, Environmental consultant (specify type) ?ADL Screening (condition at time of admission) ?Does the patient have difficulty walking or climbing stairs?: Yes ?Weakness of Legs: Both ?Weakness of Arms/Hands: None ? ?Permission Sought/Granted ?Permission sought to share information with : Family Supports ?Permission granted to share information with : Yes, Verbal Permission Granted ? Share Information with NAME: [name] ?   ? Permission granted to share info w Relationship: Girlfriend ? Permission granted to share info w Contact Information: [phone] ? ?Emotional Assessment ?Appearance:: Appears stated age ?Attitude/Demeanor/Rapport: Engaged ?Affect (typically observed): Appropriate ?Orientation: : Oriented to Self, Oriented to Place, Oriented to  Time, Oriented to Situation ?Alcohol / Substance Use: Not Applicable ?Psych Involvement: No (comment) ? ?Admission diagnosis:  COPD exacerbation ([place]) [J44.1] ?AKI (acute kidney injury) ([place]) [N17.9] ?Chronic respiratory failure with hypoxia and hypercapnia (HCC) [J96.11, J96.12] ?Patient Active Problem List  ? Diagnosis Date Noted  ? History of multiple blood transfusions 08/20/2021  ? History of amputation of right great toe ([place]) 08/20/2021  ? Emphysema of lung ([place]) 08/20/2021  ? Atherosclerosis of aorta ([place]) 08/20/2021  ? Pleural  effusion on right   ? Chronic blood loss anemia   ? COPD exacerbation ([place]) 08/19/2021  ? Acute on chronic respiratory failure with hypoxia and hypercapnia ([place]) 08/19/2021  ? Pneumonia 07/20/2021  ? Pressure injury of skin 06/20/2021  ? C. difficile diarrhea 06/20/2021  ? Hypoalbuminemia 06/20/2021  ?  Diarrhea 06/19/2021  ? Acute on chronic diastolic heart failure ([place])   ? Chronic respiratory failure with hypoxia and hypercapnia ([place]) 03/26/2021  ? Acute kidney injury superimposed on chronic kidney disease ([place]) 03/26/2021  ? Chronic pleural effusion, Left 11/16/2020  ? Peripheral neuropathy 11/11/2020  ? History of DVT (deep vein thrombosis) 11/10/2020  ? Acute on chronic anemia   ? Ascending aorta dilation ([place]) 08/04/2020  ? 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 ([place]) 08/04/2020  ? 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  ? AKI (acute kidney injury) ([place]) 04/03/2020  ? CAD (coronary artery disease) 02/15/2020  ? CKD stage 3 due to type 2 diabetes mellitus ([place]) 02/15/2020  ? Hx of BKA, left ([place]) 11/14/2019  ? High risk social situation 11/14/2019  ? Atrial flutter ([place])   ? AF (paroxysmal atrial fibrillation) ([place]) 10/03/2019  ? HFrEF (heart failure with reduced ejection fraction) ([place]) 09/28/2019  ? Severe aortic stenosis 09/28/2019  ? Anemia of chronic disease 09/27/2019  ? Protein calorie malnutrition ([place])   ? PVD (peripheral vascular disease) ([place])   ? Umbilical hernia 53/66/4403  ? Tobacco abuse 09/07/2013  ? DM (diabetes mellitus), type 2 with neurological complications ([place]) 47/42/5956  ? Hypercholesteremia 10/28/2010  ? ERECTILE DYSFUNCTION 05/22/2009  ? Hypertension associated with chronic kidney disease due to type 2 diabetes mellitus ([place]) 01/23/2009  ? ?PCP:  [name], MD ?Pharmacy:   ?[place], [place] [address] ?[address] ?[place] [address] ?Phone: [phone] Fax: [phone] ? ?[place], [place] ?[address] ?[place] [address] ?Phone: [phone] Fax: [phone] ? ? ? ? ?Social Determinants of Health (SDOH) Interventions ?  ? ?Readmission Risk Interventions ? ?  08/21/2021  ?  5:10 PM 06/26/2021  ?  4:36 PM 05/16/2021  ?  3:13 PM  ?Readmission Risk Prevention Plan  ?Transportation Screening Complete  Complete Complete  ?Medication Review Press photographer) Complete Complete Complete  ?PCP or Specialist appointment within 3-5 days of discharge Complete Complete Complete  ?[place] or Home Care Consult Complete Complete Complete  ?SW Recovery Care/Counseling Consult Complete Complete Complete  ?Palliative Care Screening Not Applicable Not Applicable Not Applicable  ?[place] Not Applicable Complete Not Applicable  ? ? ? ?

## 2021-08-28 NOTE — [place] FL2 (Addendum)
?[place] MEDICAID FL2 LEVEL OF CARE SCREENING TOOL  ?  ? ?IDENTIFICATION  ?Patient Name: ?Rodney [name] Birthdate: 01/29/1951 Sex: male Admission Date (Current Location): ?08/19/2021  ?[place] and [place] Number: ? [place] ?  Facility and Address:  ?[facility], [place] [address], [place], [place] [address] ?     Provider Number: ?[id number]  ?Attending Physician Name and Address:  ?Rodney Resides, MD ? Relative Name and Phone Number:  ?Rodney [name] (Significant other)   [phone] ?   ?Current Level of Care: ?Hospital Recommended Level of Care: ?[place] Prior Approval Number: ?  ? ?Date Approved/Denied: ?  PASRR Number: ?[id number] A ? ?Discharge Plan: ?SNF ?  ? ?Current Diagnoses: ?Patient Active Problem List  ? Diagnosis Date Noted  ? History of multiple blood transfusions 08/20/2021  ? History of amputation of right great toe ([place]) 08/20/2021  ? Emphysema of lung ([place]) 08/20/2021  ? Atherosclerosis of aorta ([place]) 08/20/2021  ? Pleural effusion on right   ? Chronic blood loss anemia   ? COPD exacerbation ([place]) 08/19/2021  ? Acute on chronic respiratory failure with hypoxia and hypercapnia ([place]) 08/19/2021  ? Pneumonia 07/20/2021  ? Pressure injury of skin 06/20/2021  ? C. difficile diarrhea 06/20/2021  ? Hypoalbuminemia 06/20/2021  ? Diarrhea 06/19/2021  ? Acute on chronic diastolic heart failure ([place])   ? Chronic respiratory failure with hypoxia and hypercapnia ([place]) 03/26/2021  ? Acute kidney injury superimposed on chronic kidney disease ([place]) 03/26/2021  ? Chronic pleural effusion, Left 11/16/2020  ? Peripheral neuropathy 11/11/2020  ? History of DVT (deep vein thrombosis) 11/10/2020  ? Acute on chronic anemia   ? Ascending aorta dilation ([place]) 08/04/2020  ? 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 ([place]) 08/04/2020  ? 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  ? AKI (acute kidney injury) ([place]) 04/03/2020  ? CAD (coronary artery disease) 02/15/2020  ? CKD stage 3 due to type 2  diabetes mellitus ([place]) 02/15/2020  ? Hx of BKA, left ([place]) 11/14/2019  ? High risk social situation 11/14/2019  ? Atrial flutter ([place])   ? AF (paroxysmal atrial fibrillation) ([place]) 10/03/2019  ? HFrEF (heart failure with reduced ejection fraction) ([place]) 09/28/2019  ? Severe aortic stenosis 09/28/2019  ? Anemia of chronic disease 09/27/2019  ? Protein calorie malnutrition ([place])   ? PVD (peripheral vascular disease) ([place])   ? Umbilical hernia 44/06/4740  ? Tobacco abuse 09/07/2013  ? DM (diabetes mellitus), type 2 with neurological complications ([place]) 59/56/3875  ? Hypercholesteremia 10/28/2010  ? ERECTILE DYSFUNCTION 05/22/2009  ? Hypertension associated with chronic kidney disease due to type 2 diabetes mellitus ([place]) 01/23/2009  ? ? ?Orientation RESPIRATION BLADDER Height & Weight   ?  ?Self, Time, Situation, Place ? O2 Continent, External catheter Weight: 207 lb 0.2 oz (93.9 kg) ?Height:  6' (182.9 cm)  ?BEHAVIORAL SYMPTOMS/MOOD NEUROLOGICAL BOWEL NUTRITION STATUS  ?    Incontinent Diet (See DC Summary)  ?AMBULATORY STATUS COMMUNICATION OF NEEDS Skin   ?Extensive Assist Verbally Surgical wounds (R foot incision) ?  ?  ?  ?    ?     ?     ? ? ?Personal Care Assistance Level of Assistance  ?Bathing, Feeding, Dressing Bathing Assistance: Maximum assistance ?Feeding assistance: Independent ?Dressing Assistance: Maximum assistance ?   ? ?Functional Limitations Info  ?Sight, Hearing, Speech Sight Info: Adequate ?Hearing Info: Impaired ?Speech Info: Adequate  ? ? ?SPECIAL CARE FACTORS FREQUENCY  ?PT (By licensed PT), OT (By licensed OT)   ?  ?  PT Frequency: 5x a week ?OT Frequency: 5x a week ?  ?  ?  ?   ? ? ?Contractures Contractures Info: Not present  ? ? ?Additional Factors Info  ?Allergies, Code Status Code Status Info: Full ?Allergies Info: NKA ?  ?  ?  ?   ? ?Current Medications (08/28/2021):  This is the current hospital active medication list ?Current Facility-Administered Medications  ?Medication Dose Route  Frequency Provider Last Rate Last Admin  ? 0.9 %  sodium chloride infusion  250 mL Intravenous PRN Rodney Sine, MD      ? acetaminophen (TYLENOL) tablet 650 mg  650 mg Oral Q4H PRN Rodney Sine, MD      ? atorvastatin (LIPITOR) tablet 80 mg  80 mg Oral Daily Rodney Sine, MD   80 mg at 08/28/21 7341  ? carvedilol (COREG) tablet 6.25 mg  6.25 mg Oral BID WC Rodney Damme, MD   6.25 mg at 08/28/21 9379  ? Chlorhexidine Gluconate Cloth 2 % PADS 6 each  6 each Topical Q0600 Rodney Resides, MD   6 each at 08/28/21 0601  ? clopidogrel (PLAVIX) tablet 75 mg  75 mg Oral Q breakfast Rodney Sine, MD   75 mg at 08/28/21 0240  ? ferric gluconate (FERRLECIT) 250 mg in sodium chloride 0.9 % 250 mL IVPB  250 mg Intravenous Daily Rodney Heck, MD      ? fidaxomicin (DIFICID) tablet 200 mg  200 mg Oral BID Lilland, Alana, DO   200 mg at 08/28/21 0929  ? fluticasone furoate-vilanterol (BREO ELLIPTA) 100-25 MCG/ACT 1 puff  1 puff Inhalation Daily Rodney Damme, MD   1 puff at 08/28/21 9735  ? furosemide (LASIX) tablet 40 mg  40 mg Oral Daily Dameron, Marisa, DO   40 mg at 08/28/21 3299  ? heparin ADULT infusion 100 units/mL (25000 units/269m)  1,600 Units/hr Intravenous Continuous Rodney Fila MD 16 mL/hr at 08/28/21 0557 1,600 Units/hr at 08/28/21 0557  ? insulin aspart (novoLOG) injection 0-20 Units  0-20 Units Subcutaneous TID WC Rodney Damme MD   4 Units at 08/27/21 1706  ? insulin detemir (LEVEMIR) injection 15 Units  15 Units Subcutaneous QHS Rodney Damme MD   15 Units at 08/27/21 2136  ? ipratropium-albuterol (DUONEB) 0.5-2.5 (3) MG/3ML nebulizer solution 3 mL  3 mL Nebulization Q2H PRN Rodney Gibson MD   3 mL at 08/20/21 1929  ? ipratropium-albuterol (DUONEB) 0.5-2.5 (3) MG/3ML nebulizer solution 3 mL  3 mL Nebulization BID HZenia Resides MD   3 mL at 08/28/21 0811  ? losartan (COZAAR) tablet 12.5 mg  12.5 mg Oral Daily O'Neal, WCassie Freer MD   12.5 mg at 08/28/21 02426 ?  mupirocin ointment (BACTROBAN) 2 % 1 application.  1 application. Nasal BID HZenia Resides MD   1 application. at 08/28/21 0932  ? ondansetron (ZOFRAN) injection 4 mg  4 mg Intravenous Q6H PRN KTroy Sine MD      ? pantoprazole (PROTONIX) EC tablet 40 mg  40 mg Oral BID Rodney Damme MD   40 mg at 08/28/21 08341 ? sodium chloride flush (NS) 0.9 % injection 3 mL  3 mL Intravenous Q12H MKathyrn DrownD, NP   3 mL at 08/28/21 0933  ? sodium chloride flush (NS) 0.9 % injection 3 mL  3 mL Intravenous Q12H KTroy Sine MD   3 mL at 08/28/21 0933  ? sodium chloride flush (NS) 0.9 % injection 3  mL  3 mL Intravenous PRN Rodney Sine, MD      ? traZODone (DESYREL) tablet 25-50 mg  25-50 mg Oral QHS Rodney Heck, MD      ? umeclidinium bromide (INCRUSE ELLIPTA) 62.5 MCG/ACT 1 puff  1 puff Inhalation Daily Rodney Damme, MD   1 puff at 08/28/21 1464  ? ? ? ?Discharge Medications: ?Please see discharge summary for a list of discharge medications. ? ?Relevant Imaging Results: ? ?Relevant Lab Results: ? ? ?Additional Information ?SSN [id number]. PT CURRENTLY COVID POSITIVE TEST DATE: 07/20/21.  Pt also vaccinated:  PFIZER Comrnaty(Gray TOP) Covid-19 Vaccine 07/03/2020  Pfizer COVID-19 Vaccine 12/15/2019 , 11/23/2019  Pfizer Covid-19 Vaccine Bivalent Booster 02/13/2021 ? ?Onaje Warne Renold Don, LCSWA ? ? ? ? ?

## 2021-08-28 NOTE — Progress Notes (Addendum)
?  Progress Note ? ? ? ?08/28/2021 ?7:23 AM ?2 Days Post-Op ? ?Subjective:  resting; dressing just changed by RN.  Denies pain. ? ?afebrile ? ?Vitals:  ? 08/28/21 0026 08/28/21 6295  ?BP: 135/79 131/73  ?Pulse: 73 75  ?Resp: 17 19  ?Temp: 98.3 ?F (36.8 ?C) 98.1 ?F (36.7 ?C)  ?SpO2: 100% 97%  ? ? ?Physical Exam: ?General:  no distress ?Lungs:  non labored ?Incisions:  bandage in place right foot with scant bloody drainage on bandage.   ? ? ?CBC ?   ?Component Value Date/Time  ? WBC 6.5 08/28/2021 0242  ? RBC 2.79 (L) 08/28/2021 0242  ? HGB 8.2 (L) 08/28/2021 0242  ? HGB 8.4 (L) 05/19/2021 1200  ? HCT 25.6 (L) 08/28/2021 0242  ? HCT 28.2 (L) 05/19/2021 1200  ? PLT 147 (L) 08/28/2021 0242  ? PLT 255 05/19/2021 1200  ? MCV 91.8 08/28/2021 0242  ? MCV 84 05/19/2021 1200  ? MCH 29.4 08/28/2021 0242  ? MCHC 32.0 08/28/2021 0242  ? RDW 15.6 (H) 08/28/2021 0242  ? RDW 16.2 (H) 05/19/2021 1200  ? LYMPHSABS 0.8 08/19/2021 0545  ? MONOABS 0.8 08/19/2021 0545  ? EOSABS 0.3 08/19/2021 0545  ? BASOSABS 0.1 08/19/2021 0545  ? ? ?BMET ?   ?Component Value Date/Time  ? NA 135 08/27/2021 0127  ? NA 141 05/19/2021 1200  ? K 4.0 08/27/2021 0127  ? CL 99 08/27/2021 0127  ? CO2 31 08/27/2021 0127  ? GLUCOSE 246 (H) 08/27/2021 0127  ? BUN 25 (H) 08/27/2021 0127  ? BUN 49 (H) 05/19/2021 1200  ? CREATININE 1.25 (H) 08/27/2021 0127  ? CREATININE 1.19 10/09/2015 1554  ? CALCIUM 7.9 (L) 08/27/2021 0127  ? GFRNONAA >60 08/27/2021 0127  ? GFRNONAA 65 10/09/2015 1554  ? GFRAA 52 (L) 05/01/2020 1632  ? GFRAA 75 10/09/2015 1554  ? ? ?INR ?   ?Component Value Date/Time  ? INR 1.1 08/28/2021 0242  ? ? ? ?Intake/Output Summary (Last 24 hours) at 08/28/2021 0723 ?Last data filed at 08/28/2021 0557 ?Gross per 24 hour  ?Intake 909.72 ml  ?Output 350 ml  ?Net 559.72 ml  ? ? ? ?Assessment/Plan:  71 y.o. male is s/p:  ?Right 2nd toe amputation  ?2 Days Post-Op ? ? ?-dressing just changed by RN and therefore did not remove.  There is some scant bloody drainage on  Kerlix.  Continue daily dressing changes and as needed.  Heel weight bearing in Darco shoe to help healing.  ?-f/u in 3-4 weeks for suture removal.  (Our office will arrange appt). ? ? ?Leontine Locket, PA-C ?[facility] ?[phone] ?08/28/2021 ?7:23 AM ? ?I have seen and evaluated the patient. I agree with the PA note as documented above.  ? ?Marty Heck, MD ?[facility] ?Office: [phone] ? ? ?

## 2021-08-28 NOTE — Progress Notes (Addendum)
Family Medicine Teaching Service ?Daily Progress Note ?Intern Pager: [phone] ? ?Patient name: Rodney [name] Medical record number: [id number] ?Date of birth: [birth year] Age: 71 y.o. Gender: male ? ?Primary Care Provider: [name], MD ?Consultants: Cardiology, vascular surgery ?Code Status: Full code ? ?Pt Overview and Major Events to Date:  ?4/25-admitted, placed on BiPAP briefly then weaned to nasal cannula, started cefepime and given IV methylprednisolone ?4/26-transitioned steroids to oral prednisone, transfused 1u PRBC ?4/27-cefepime transitioned to cefdinir ?4/29-completed course of cefdinir and prednisone ?5/1-B/l heart cath  ?5/2-Right toe amputation ?  ?Assessment and Plan: ?Rodney [name] is a 74-year initially admitted for COPD exacerbation (now resolved).  Now experiencing C. difficile recurrence and undergoing preop preparation for TAVR, which includes L+RHC and amputation of gangrenous toe. PMHx significant for: COPD, HFrEF, severe AS, CAD, PAD s/p left BKA, PAF on warfarin, T2DM, CKD stage III, anemia of chronic disease, protein calorie malnutrition. ?  ?C. difficile recurrence, improving ?BM x1, watery.  No abdominal pain.  No leukocytosis. ?- Continued Pradaxa mycin 200 mg BID x10d, end date 09/02/21  ? ?HFpEF 2/2 Severe AS & CAD s/p L&R Heart Cath (5/1) ?Baseline 3L O2 [place]. Euvolemic. Currently on home dose Lasix. K and Cr remains stable. 300 mL output, likely other un-documented output, given euvolemic status, no change in respiration, not down in weight since admission.   ?- Cardiology consulted appreciate assistance and recs ?- Strict I/Os and daily weights ?- BMP and Mg2+ daily ?- K+ >4, Mg2+ >2, replete as needed ?- Continued PO lasix 40 mg  ?- Losartan, coreg ?- BMP ? ?pAF, rate controlled and AC, stable ?- Continued Coreg 6.25 mg BID  ?- Continued heparin until dc for procedures, then bridge to warfarin per pharmacy ? ?Gangrene of the right second toe 2/2 PAD s/p left BKA and right  great toe amputation 5/2 (08/26/2021)  ?- Coreg per below ?- plavix ?- dressing removed and amp site looks good with nylon suture in place.  Dressing changes daily. ?- heel weight bearing only with darco shoe ?- f/u in 3-4 weeks for wound check/suture removal ? ?Poor dentition ?Requires teeth extraction for TAVR prep.  Consulted oral surgery for extraction.  If unable to come inpatient, will prep patient for discharge and restarting warfarin. ?- Consulted oral surgery ?- Possible teeth extraction per oral surgery ? ?Anemia of CKD and iron deficiency, stable ?Given no further procedure currently, starting IV iron ?- Transfusion if Hb <8 ?- CBC to monitor Hb  ?- IV ferrous gluconate 250 x2d, end 5/5 ? ?Acute on chronic hypoxemic respiratory failure secondary to COPD exacerbation; resolved ?Baseline 3L [place] currently. Completed prednisone and cefdinir x5d 4/30.  Patient declined turning down his oxygen from 3 L despite being 100% SPO2. ?- Duo nebs BID ?- Continued Breo Ellipta daily ?- O2 goal: 88-92% ? ?CKD III, improving and stable ?Stable, at baseline Cr. 1.33.  Current Cr (!) 1.25  ?- Labs per above ? ?T2DM, controlled ?A1c 5.9 (08/20/2021).  CBG 150-170s. 12 units of short-acting insulin. ?- Continued Levemir 15u daily ?- mSSI ?   ?Mediastinal lymph nodes ?- Outpatient PET follow-up ?  ?GERD ?- Protonix 40 mg ?  ?Insomnia ?- Trazodone nightly sch ? ?De-Conditioning ?Strongly suspect depression, declined evaluation. ?- OOB order ?- Encourage mobilization ? ?FEN/GI: Heart healthy ?  ?PPx: Protonix ?Dispo: Skilled nursing-short term rehab (<3 hours/day) -patient refused SNF in the past, may require PT reevaluation.  Barrier: TAVR preop with dental extraction and heparin to warfarin ?  DMEs:  None recommended by PT (08/27/21)  ? ?Subjective:  ?No family at bedside. ? ?Patient was resting comfortably in bed, no distress. 1 BM yesterday, no pain. No other concerns.  Requested medicine for insomnia while in the hospital.  No  urinary issues.  No other concerns. ? ?Objective: ?Temp:  [98.1 ?F (36.7 ?C)-98.9 ?F (37.2 ?C)] 98.1 ?F (36.7 ?C) (05/04 5993) ?Pulse Rate:  [73-75] 75 (05/04 0929) ?Resp:  [17-19] 19 (05/04 5701) ?BP: (131-141)/(73-79) 141/74 (05/04 0929) ?SpO2:  [92 %-100 %] 92 % (05/04 0811) ?Weight:  [93.9 kg] 93.9 kg (05/04 0259) ?Physical Exam: ?General: Awake, alert, no acute distress  ?Cardiovascular: murmur  ?Respiratory: CTAB. Normal breathing efforts.  ?Abdomen: Soft, nondistended, nontender ? ?Laboratory: ?Recent Labs  ?Lab 08/26/21 ?0450 08/27/21 ?0127 08/28/21 ?7793  ?WBC 6.9 7.6 6.5  ?HGB 8.8* 8.2* 8.2*  ?HCT 27.3* 25.9* 25.6*  ?PLT 181 157 147*  ? ?Recent Labs  ?Lab 08/25/21 ?0134 08/25/21 ?1508 08/25/21 ?1518 08/26/21 ?0450 08/27/21 ?0127  ?NA 135   < > 137 134* 135  ?K 3.9   < > 3.5 3.3* 4.0  ?CL 96*  --   --  95* 99  ?CO2 33*  --   --  32 31  ?BUN 34*  --   --  29* 25*  ?CREATININE 1.45*  --   --  1.36* 1.25*  ?CALCIUM 8.3*  --   --  8.0* 7.9*  ?GLUCOSE 163*  --   --  169* 246*  ? < > = values in this interval not displayed.  ? ?Imaging/Diagnostic Tests: ?No results found.  ? ?Merrily Brittle, DO ?08/28/2021, 12:56 PM ?PGY-1, [place] [facility] ?[place] Intern pager: [phone], text pages welcome ?

## 2021-08-29 DIAGNOSIS — J441 Chronic obstructive pulmonary disease with (acute) exacerbation: Secondary | ICD-10-CM | POA: Diagnosis not present

## 2021-08-29 DIAGNOSIS — J9622 Acute and chronic respiratory failure with hypercapnia: Secondary | ICD-10-CM | POA: Diagnosis not present

## 2021-08-29 DIAGNOSIS — I350 Nonrheumatic aortic (valve) stenosis: Secondary | ICD-10-CM | POA: Diagnosis not present

## 2021-08-29 DIAGNOSIS — J9621 Acute and chronic respiratory failure with hypoxia: Secondary | ICD-10-CM | POA: Diagnosis not present

## 2021-08-29 LAB — BASIC METABOLIC PANEL
Anion gap: 6 (ref 5–15)
BUN: 20 mg/dL (ref 8–23)
CO2: 31 mmol/L (ref 22–32)
Calcium: 8 mg/dL — ABNORMAL LOW (ref 8.9–10.3)
Chloride: 98 mmol/L (ref 98–111)
Creatinine, Ser: 1.3 mg/dL — ABNORMAL HIGH (ref 0.61–1.24)
GFR, Estimated: 59 mL/min — ABNORMAL LOW (ref >60)
Glucose, Bld: 171 mg/dL — ABNORMAL HIGH (ref 70–99)
Potassium: 3.8 mmol/L (ref 3.5–5.1)
Sodium: 135 mmol/L (ref 135–145)

## 2021-08-29 LAB — GLUCOSE, CAPILLARY
Glucose-Capillary: 180 mg/dL — ABNORMAL HIGH (ref 70–99)
Glucose-Capillary: 152 mg/dL — ABNORMAL HIGH (ref 70–99)
Glucose-Capillary: 146 mg/dL — ABNORMAL HIGH (ref 70–99)
Glucose-Capillary: 158 mg/dL — ABNORMAL HIGH (ref 70–99)

## 2021-08-29 LAB — PROTIME-INR
INR: 1.1 (ref 0.8–1.2)
Prothrombin Time: 14.4 seconds (ref 11.4–15.2)

## 2021-08-29 LAB — CBC
HCT: 25.3 % — ABNORMAL LOW (ref 39.0–52.0)
Hemoglobin: 8 g/dL — ABNORMAL LOW (ref 13.0–17.0)
MCH: 29.2 pg (ref 26.0–34.0)
MCHC: 31.6 g/dL (ref 30.0–36.0)
MCV: 92.3 fL (ref 80.0–100.0)
Platelets: 152 10*3/uL (ref 150–400)
RBC: 2.74 MIL/uL — ABNORMAL LOW (ref 4.22–5.81)
RDW: 15.6 % — ABNORMAL HIGH (ref 11.5–15.5)
WBC: 5.8 10*3/uL (ref 4.0–10.5)
nRBC: 0 % (ref 0.0–0.2)

## 2021-08-29 LAB — HEPARIN LEVEL (UNFRACTIONATED): Heparin Unfractionated: 0.6 IU/mL (ref 0.30–0.70)

## 2021-08-29 MED ORDER — WARFARIN - PHARMACIST DOSING INPATIENT: Freq: Every day | Status: DC

## 2021-08-29 MED ORDER — WARFARIN SODIUM 2 MG PO TABS
4.0000 mg | ORAL_TABLET | Freq: Once | ORAL | Status: AC
  Administered 2021-08-29: 4 mg via ORAL
  Filled 2021-08-29: qty 2

## 2021-08-29 MED ORDER — FUROSEMIDE 40 MG PO TABS
40.0000 mg | ORAL_TABLET | Freq: Two times a day (BID) | ORAL | Status: DC
Start: 2021-08-29 — End: 2021-08-29

## 2021-08-29 MED ORDER — ENOXAPARIN SODIUM 100 MG/ML IJ SOSY
100.0000 mg | PREFILLED_SYRINGE | Freq: Two times a day (BID) | INTRAMUSCULAR | Status: DC
Start: 2021-08-29 — End: 2021-09-02
  Administered 2021-08-29 – 2021-09-02 (×8): 100 mg via SUBCUTANEOUS
  Filled 2021-08-29 (×8): qty 1

## 2021-08-29 NOTE — Consult Note (Signed)
? ?  [facility] CM Inpatient Consult ? ? ?08/29/2021 ? ?Otelia Limes Joynt ?[birth year] ?[id number] ? ?Follow up:  [facility] Medicare ? ?LLOS 10 days ? ?Reviewed for disposition needs and patient is being recommended for a skilled nursing facility level of care. ? ?Plan:  Follow for disposition, if patient transitions to a SNF his post hospital needs are to be met at that level of care. ? ?[name], RN BSN CCM ?[facility] Liaison ? [phone] business mobile phone ?Toll free office [phone]  ?Fax number: [phone] ?[email or web address] ? ? ?

## 2021-08-29 NOTE — Progress Notes (Signed)
ANTICOAGULATION CONSULT NOTE - Follow-Up Consult ? ?Pharmacy Consult IV heparin, warfarin ?Indication: atrial fibrillation and hx DVT ? ?No Known Allergies ? ?Patient Measurements: ?Height: 6' (182.9 cm) ?Weight: 93.4 kg (205 lb 14.6 oz) ?IBW/kg (Calculated) : 77.6 ?Heparin dosing weight: 94 kg ? ?Vital Signs: ?Temp: 98 ?F (36.7 ?C) (05/05 1148) ?Temp Source: Oral (05/05 1148) ?BP: 130/71 (05/05 1148) ?Pulse Rate: 75 (05/05 1148) ? ?Labs: ?Recent Labs  ?  08/27/21 ?0127 08/27/21 ?1059 08/28/21 ?0242 08/29/21 ?0334  ?HGB 8.2*  --  8.2* 8.0*  ?HCT 25.9*  --  25.6* 25.3*  ?PLT 157  --  147* 152  ?LABPROT 15.7*  --  14.3 14.4  ?INR 1.3*  --  1.1 1.1  ?HEPARINUNFRC 0.26* 0.35 0.46 0.60  ?CREATININE 1.25*  --   --  1.30*  ? ? ? ?Estimated Creatinine Clearance: 63.6 mL/min (A) (by C-G formula based on SCr of 1.3 mg/dL (H)). ? ? ?Medical History: ?Past Medical History:  ?Diagnosis Date  ? Acute combined systolic and diastolic congestive heart failure ([place])   ? Acute on chronic heart failure with preserved ejection fraction (HFpEF) ([place])   ? Acute respiratory failure with hypoxia ([place])   ? Acute respiratory failure with hypoxia ([place])   ? Aortic stenosis   ? moderate in 2022  ? Atrial fibrillation ([place])   ? CHF (congestive heart failure) ([place])   ? Chronic pleural effusion, Left 11/16/2020  ? COPD exacerbation ([place]) 12/09/2020  ? Coronary artery disease   ? Demand ischemia ([place]) 03/26/2021  ? Diabetes mellitus without complication ([place])   ? HLD (hyperlipidemia)   ? Hypertension   ? Long term (current) use of anticoagulants 12/29/2019  ? Malnutrition of moderate degree 05/09/2021  ? Peripheral arterial disease ([place])   ? Pneumonia due to COVID-19 virus 05/29/2021  ? ? ?Assessment: ?71 yo M presenting with dyspnea 2/2 COPD exacerbation. PMH significant for afib on PTA warfarin 2.'5mg'$  daily. Pt s/p R/LHC (5/1) and 2nd toe amputation (5/2). Pt has been on IV heparin while warfarin was held but is restarting warfarin today. Pharmacy  consulted for warfarin dosing with heparin bridge. Will continue heparin bridge until INR is therapeutic. Heparin level therapeutic at 0.60 on 1600 units/hr. Hemoglobin and platelets stable, no signs of bleeding noted. INR is 1.1. PTA warfarin dose was 2.'5mg'$   and initiating a dose of '4mg'$  given the lapse of several days.  ? ?Goal of Therapy:  ?Heparin level 0.3-0.7 units/ml ?INR 2-2.5 (decreased due to normocytic anemia in past) ?Monitor platelets by anticoagulation protocol: Yes ?  ?Plan:  ?Continue heparin at 1600 units/hr  ?Start warfarin '4mg'$   ?Daily heparin level, CBC, INR ?Monitor for s/s of bleeding ? ? ?Inis Sizer, PharmD Candidate  ? ?**Pharmacist phone directory can be found on [email or web address] listed under [place]. ? ?08/29/2021 1:08 PM ? ?

## 2021-08-29 NOTE — Progress Notes (Signed)
FPTS Brief Progress Note ? ?S:Went to bedside to see patient, patient awake laying comfortably in bed, no complaints.  ? ? ?O: ?BP 139/73 (BP Location: Left Arm)   Pulse 75   Temp 97.8 ?F (36.6 ?C) (Oral)   Resp 20   Ht 6' (1.829 m)   Wt 93.4 kg   SpO2 100%   BMI 27.93 kg/m?   ? ? ?A/P: ?- Plans per day team ?- Orders reviewed. Labs for AM ordered, which was adjusted as needed.  ? ? ?Holley Bouche, MD ?08/29/2021, 3:06 AM ?PGY-1, [place] [facility] Night Resident  ?Please page [phone] with questions.  ? ? ?

## 2021-08-29 NOTE — Progress Notes (Signed)
Occupational Therapy Treatment ?Patient Details ?Name: Rodney [name] ?MRN: [id number] ?DOB: 01-May-1950 ?Today's Date: 08/29/2021 ? ? ?History of present illness Patient is a 71 yo male presenting to the ED with SOB on 08/19/21. Admitted same day with acute respiratory failure and COPD exacerbation. Patient with 4 admissions within the last 6 months. 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  PMH is significant for DM 2, obesity, tobacco abuse, hypertension, protein calorie malnutrition, aortic stenosis, anemia chronic disease, PVD, HFrEF, PAF, status post BKA, CAD, CKD stage III, COPD, COVID+ ?  ?OT comments ? Patient continues to make incremental progress towards goals in skilled OT session. Patient's session encompassed theraband exercises at bed level as no DARCO shoe present to engage in EOB or OOB mobility (OT speaking to PT after session with PT to call Ortho tec to order shoe). Patient with increased cognition and mentation in session, but remains flat and frustrated. OT continues to recommend SNF    ? ?Recommendations for follow up therapy are one component of a multi-disciplinary discharge planning process, led by the attending physician.  Recommendations may be updated based on patient status, additional functional criteria and insurance authorization. ?   ?Follow Up Recommendations ? Skilled nursing-short term rehab (<3 hours/day)  ?  ?Assistance Recommended at Discharge Frequent or constant Supervision/Assistance  ?Patient can return home with the following ? A little help with walking and/or transfers;A little help with bathing/dressing/bathroom;Assistance with cooking/housework;Assist for transportation;Help with stairs or ramp for entrance;Direct supervision/assist for medications management ?  ?Equipment Recommendations ? None recommended by OT (patient has DME needed)  ?  ?Recommendations for Other Services   ? ?  ?Precautions / Restrictions Precautions ?Precautions: Fall ?Precaution Comments: L  BKA ?Required Braces or Orthoses: Other Brace ?Other Brace: Needs darco shoe ?Restrictions ?Weight Bearing Restrictions: No ?RLE Weight Bearing: Weight bearing as tolerated ?Other Position/Activity Restrictions: heel weightbearing in darco shoe on the R  ? ? ?  ? ?Mobility Bed Mobility ?  ?  ?  ?  ?  ?  ?  ?General bed mobility comments: Unable to sit EOB due to no DARCO shoe present ?  ? ?Transfers ?  ?  ?  ?  ?  ?  ?  ?  ?  ?  ?  ?  ?Balance   ?  ?  ?  ?  ?  ?  ?  ?  ?  ?  ?  ?  ?  ?  ?  ?  ?  ?  ?   ? ?ADL either performed or assessed with clinical judgement  ? ?ADL Overall ADL's : Needs assistance/impaired ?  ?  ?  ?  ?  ?  ?  ?  ?  ?  ?  ?  ?  ?  ?  ?  ?  ?  ?Functional mobility during ADLs: Moderate assistance ?General ADL Comments: More coherent in session, but remains limited in participation ?  ? ?Extremity/Trunk Assessment   ?  ?  ?  ?  ?  ? ?Vision   ?  ?  ?Perception   ?  ?Praxis   ?  ? ?Cognition   ?Behavior During Therapy: Flat affect ?Overall Cognitive Status: No family/caregiver present to determine baseline cognitive functioning ?  ?  ?  ?  ?  ?  ?  ?  ?  ?  ?  ?  ?  ?  ?  ?  ?General Comments: More participatory,  but remains flat ?  ?  ?   ?Exercises General Exercises - Upper Extremity ?Shoulder Flexion: AROM, Both, 10 reps, Theraband ?Theraband Level (Shoulder Flexion): Level 2 (Red) ?Shoulder ABduction: AROM, Both, 10 reps, Theraband, Supine ?Theraband Level (Shoulder Abduction): Level 2 (Red) ?Elbow Extension: AROM, Both, 10 reps, Theraband, Supine ?Theraband Level (Elbow Extension): Level 2 (Red) ?General Exercises - Lower Extremity ?Straight Leg Raises: AROM, Both, 10 reps, Supine ? ?  ?Shoulder Instructions   ? ? ?  ?General Comments    ? ? ?Pertinent Vitals/ Pain       Pain Assessment ?Pain Assessment: Faces ?Faces Pain Scale: Hurts a little bit ?Pain Location: R foot ?Pain Descriptors / Indicators: Grimacing, Guarding ?Pain Intervention(s): Limited activity within patient's tolerance,  Monitored during session ? ?Home Living   ?  ?  ?  ?  ?  ?  ?  ?  ?  ?  ?  ?  ?  ?  ?  ?  ?  ?  ? ?  ?Prior Functioning/Environment    ?  ?  ?  ?   ? ?Frequency ? Min 2X/week  ? ? ? ? ?  ?Progress Toward Goals ? ?OT Goals(current goals can now be found in the care plan section) ? Progress towards OT goals: Progressing toward goals ? ?Acute Rehab OT Goals ?Patient Stated Goal: to be left alone ?OT Goal Formulation: With patient ?Time For Goal Achievement: 09/03/21 ?Potential to Achieve Goals: Fair  ?Plan Discharge plan remains appropriate   ? ?Co-evaluation ? ? ?   ?  ?  ?  ?  ? ?  ?AM-PAC OT "6 Clicks" Daily Activity     ?Outcome Measure ? ? Help from another person eating meals?: A Little ?Help from another person taking care of personal grooming?: A Little ?Help from another person toileting, which includes using toliet, bedpan, or urinal?: A Lot ?Help from another person bathing (including washing, rinsing, drying)?: A Lot ?Help from another person to put on and taking off regular upper body clothing?: A Little ?Help from another person to put on and taking off regular lower body clothing?: A Lot ?6 Click Score: 15 ? ?  ?End of Session Equipment Utilized During Treatment: Oxygen ? ?OT Visit Diagnosis: Muscle weakness (generalized) (M62.81) ?  ?Activity Tolerance Patient tolerated treatment well ?  ?Patient Left in bed;with call bell/phone within reach ?  ?Nurse Communication Mobility status;Other (comment) (No DARCO shoe in room) ?  ? ?   ? ?Time: 8119-1478 ?OT Time Calculation (min): 11 min ? ?Charges: OT General Charges ?$OT Visit: 1 Visit ?OT Treatments ?$Therapeutic Exercise: 8-22 mins ? ?Corinne Ports E. Loden Laurent, OTR/L ?Acute Rehabilitation Services ?[phone] ?[phone]  ? ?Corinne Ports Dustin Bumbaugh ?08/29/2021, 2:41 PM ?

## 2021-08-29 NOTE — Progress Notes (Addendum)
Family Medicine Teaching Service ?Daily Progress Note ?Intern Pager: [phone] ? ?Patient name: Rodney [name] Medical record number: [id number] ?Date of birth: [birth year] Age: 71 y.o. Gender: male ? ?Primary Care Provider: [name], MD ?Consultants: Cardiology, vascular surgery ?Code Status: Full code ? ?Pt Overview and Major Events to Date:  ?4/25-admitted, placed on BiPAP briefly then weaned to nasal cannula, started cefepime and given IV methylprednisolone ?4/26-transitioned steroids to oral prednisone, transfused 1u PRBC ?4/27-cefepime transitioned to cefdinir ?4/29-completed course of cefdinir and prednisone ?5/1-B/l heart cath  ?5/2-Right toe amputation ?5/5-restarted warfarin with heparin bridge ?  ? ?Assessment and Plan: ?[name] is a 33-year initially admitted for COPD exacerbation (now resolved).  Now experiencing C. difficile recurrence and undergoing preop preparation for TAVR, which includes L+RHC and amputation of gangrenous toe. PMHx significant for: COPD, HFrEF 2/2 severe AS, CAD, PAD s/p left BKA, PAF on warfarin, T2DM, CKD stage III, anemia of chronic disease, protein calorie malnutrition. ? ?C. difficile recurrence, improving ?BM x0.  No abdominal pain.  ?- Continued Pradaxa mycin 200 mg BID x10d, end date 09/02/21  ? ?HFpEF 2/2 Severe AS & CAD s/p L&R Heart Cath (5/1) ?Baseline 3L O2 [place], SpO2 decreased slightly, still >90%. Will trial 1 day of additional PM lasix and reassess tomorrow AM.  ?- Cardiology consulted appreciate assistance and recs ?- Strict I/Os and daily weights ?- BMP and Mg2+ daily ?- K+ >4, Mg2+ >2, replete as needed ?- Continued PO lasix 40 mg  ?- Losartan, coreg ?- BMP ? ?pAF, rate controlled and AC, stable ?- Continued Coreg 6.25 mg BID  ?- Initiated warfarin per pharmacy with heparin bridge ? 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 ?Poor dentition ?Required teeth extraction for TAVR prep.  Patient will have to follow up with oral surgery outpatient. ?- TOC consulted for transportation assistance ?- Consulted oral surgery ? ?Anemia of CKD and iron deficiency, stable ?- Transfusion if Hb <8 ?- CBC to monitor Hb  ?- IV ferrous gluconate 250 x2d, end 5/5 ? ?Acute on chronic hypoxemic respiratory failure secondary to COPD exacerbation; resolved ?Baseline 3L  currently. Completed prednisone and cefdinir x5d 4/30.  Patient declined turning down his oxygen from 3 L despite being 100% SPO2. ?- Duo nebs BID ?- Continued Breo Ellipta daily ?- O2 goal: 88-92% ? ?CKD III, improving and stable ?Stable, baseline Cr 1.33. Current Cr (!) 1.30  ?- Labs per above ? ?T2DM, controlled ?A1c 5.9 (08/20/2021).  CBG 150-170s. 12 units of short-acting insulin. ?- Continued Levemir 15u daily ?- mSSI ?   ?Mediastinal lymph nodes ?- Outpatient PET follow-up ?  ?GERD ?- Protonix 40 mg ?  ?Insomnia ?- Trazodone nightly sch ? ?De-Conditioning ?- OOB order ?- Encourage mobilization ? ?FEN/GI: Heart healthy ?PPx: Protonix ? ?Dispo: Skilled nursing-short term rehab (<3 hours/day) -patient refused SNF in the past, may require PT reevaluation.   ?Barrier: SNF warfarin and IV Fe and antibiotic price ?DMEs:  None recommended by PT (08/29/21)  ? ?Subjective:  ?No family at bedside. ? ?Patient denied pain, dysuria, abdominal pain, chest pain, SOB, bowel movements, bloating.  He has no other concerns. ? ?Objective: ?Temp:  [97.4 ?F (36.3 ?C)-98 ?F (36.7 ?C)] 98 ?F (36.7 ?C) (05/05 1148) ?Pulse Rate:  [  75-77] 75 (05/05 1148) ?Resp:  [14-20] 16 (05/05 1148) ?BP: (114-139)/(58-73) 130/71 (05/05 1148) ?SpO2:  [96 %-100 %] 98 % (05/05 1148) ?Weight:  [93.4 kg] 93.4 kg (05/05 0011) ?Physical Exam: ?General: Awake, alert, no acute distress  ?Cardiovascular: murmur  ?Respiratory: Crackles left  lung, throughout. Normal breathing efforts.  ?Abdomen: Soft, nondistended, nontender ? ?Laboratory: ?Recent Labs  ?Lab 08/27/21 ?0127 08/28/21 ?7510 08/29/21 ?2585  ?WBC 7.6 6.5 5.8  ?HGB 8.2* 8.2* 8.0*  ?HCT 25.9* 25.6* 25.3*  ?PLT 157 147* 152  ? ?Recent Labs  ?Lab 08/26/21 ?0450 08/27/21 ?0127 08/29/21 ?2778  ?NA 134* 135 135  ?K 3.3* 4.0 3.8  ?CL 95* 99 98  ?CO2 32 31 31  ?BUN 29* 25* 20  ?CREATININE 1.36* 1.25* 1.30*  ?CALCIUM 8.0* 7.9* 8.0*  ?GLUCOSE 169* 246* 171*  ? ?Imaging/Diagnostic Tests: ?No results found.  ? ?Rodney Brittle, DO ?08/29/2021, 2:41 PM ?PGY-1, [place] ?[place] Intern pager: [phone], text pages welcome ?

## 2021-08-29 NOTE — TOC Progression Note (Signed)
Transition of Care (TOC) - Progression Note  ? ? ?Patient Details  ?Name: Rodney [name] ?MRN: [id number] ?Date of Birth: 1950-11-21 ? ?Transition of Care (TOC) CM/SW Contact  ?Evanee Lubrano Renold Don, LCSWA ?Phone Number: ?08/29/2021, 3:03 PM ? ?Clinical Narrative:    ?CSW spoke with pt about DC to [facility], pt is agreeable and would like for his girlfriend to bring him clothing before DC. CSW contacted Juliann Pulse at [facility] to inform her of pt DC.  ?CSW spoke with pt girlfriend about DC, she would like for someone to contact her about pt medical status.   ? ? ?Expected Discharge Plan: [place] ?Barriers to Discharge: Continued Medical Work up, Ship broker ? ?Expected Discharge Plan and Services ?Expected Discharge Plan: [place] ?  ?Discharge Planning Services: CM Consult ?Post Acute Care Choice: [place] ?Living arrangements for the past 2 months: [place] ?                ?  ?DME Agency: NA ?  ?  ?  ?HH Arranged: RN, PT, OT ?[place] Agency: [place] ?Date HH Agency Contacted: 08/20/21 ?Time [place]: 1031 ?Representative spoke with at [place]: Tommi Rumps ? ? ?Social Determinants of Health (SDOH) Interventions ?  ? ?Readmission Risk Interventions ? ?  08/21/2021  ?  5:10 PM 06/26/2021  ?  4:36 PM 05/16/2021  ?  3:13 PM  ?Readmission Risk Prevention Plan  ?Transportation Screening Complete Complete Complete  ?Medication Review Press photographer) Complete Complete Complete  ?PCP or Specialist appointment within 3-5 days of discharge Complete Complete Complete  ?[place] or Home Care Consult Complete Complete Complete  ?SW Recovery Care/Counseling Consult Complete Complete Complete  ?Palliative Care Screening Not Applicable Not Applicable Not Applicable  ?[place] Not Applicable Complete Not Applicable  ? ? ?

## 2021-08-29 NOTE — Progress Notes (Signed)
ANTICOAGULATION CONSULT NOTE - Follow-Up Consult ? ?Pharmacy Consult IV heparin >> enoxaparin, warfarin ?Indication: atrial fibrillation and hx DVT ? ?No Known Allergies ? ?Patient Measurements: ?Height: 6' (182.9 cm) ?Weight: 93.4 kg (205 lb 14.6 oz) ?IBW/kg (Calculated) : 77.6 ?Heparin dosing weight: 94 kg ? ?Vital Signs: ?Temp: 98 ?F (36.7 ?C) (05/05 1148) ?Temp Source: Oral (05/05 1148) ?BP: 130/71 (05/05 1148) ?Pulse Rate: 75 (05/05 1148) ? ?Labs: ?Recent Labs  ?  08/27/21 ?0127 08/27/21 ?1059 08/28/21 ?0242 08/29/21 ?0334  ?HGB 8.2*  --  8.2* 8.0*  ?HCT 25.9*  --  25.6* 25.3*  ?PLT 157  --  147* 152  ?LABPROT 15.7*  --  14.3 14.4  ?INR 1.3*  --  1.1 1.1  ?HEPARINUNFRC 0.26* 0.35 0.46 0.60  ?CREATININE 1.25*  --   --  1.30*  ? ? ? ?Estimated Creatinine Clearance: 63.6 mL/min (A) (by C-G formula based on SCr of 1.3 mg/dL (H)). ? ? ?Medical History: ?Past Medical History:  ?Diagnosis Date  ? Acute combined systolic and diastolic congestive heart failure ([place])   ? Acute on chronic heart failure with preserved ejection fraction (HFpEF) ([place])   ? Acute respiratory failure with hypoxia ([place])   ? Acute respiratory failure with hypoxia ([place])   ? Aortic stenosis   ? moderate in 2022  ? Atrial fibrillation ([place])   ? CHF (congestive heart failure) ([place])   ? Chronic pleural effusion, Left 11/16/2020  ? COPD exacerbation ([place]) 12/09/2020  ? Coronary artery disease   ? Demand ischemia ([place]) 03/26/2021  ? Diabetes mellitus without complication ([place])   ? HLD (hyperlipidemia)   ? Hypertension   ? Long term (current) use of anticoagulants 12/29/2019  ? Malnutrition of moderate degree 05/09/2021  ? Peripheral arterial disease ([place])   ? Pneumonia due to COVID-19 virus 05/29/2021  ? ? ?Assessment: ?71 yo M presenting with dyspnea 2/2 COPD exacerbation. PMH significant for afib on PTA warfarin 2.'5mg'$  daily. Pt s/p R/LHC (5/1) and 2nd toe amputation (5/2). Pt has been on IV heparin while warfarin was held but is restarting warfarin  today. Pharmacy consulted for warfarin dosing with heparin bridge. Will continue heparin bridge until INR is therapeutic. Heparin level therapeutic at 0.60 on 1600 units/hr. Hemoglobin and platelets stable, no signs of bleeding noted. INR is 1.1. PTA warfarin dose was 2.'5mg'$   and initiating a dose of '4mg'$  given the lapse of several days.  ? ?Goal of Therapy:  ?Heparin level 0.3-0.7 units/ml ?INR 2-2.5 (decreased due to normocytic anemia in past) ?Monitor platelets by anticoagulation protocol: Yes ?  ?Plan:  ?Continue heparin at 1600 units/hr  ?Start warfarin '4mg'$   ?Daily heparin level, CBC, INR ?Monitor for s/s of bleeding ? ? ?Inis Sizer, PharmD Candidate  ? ?**Pharmacist phone directory can be found on [email or web address] listed under [place]. ? ?08/29/2021 2:48 PM ? ? ?Addendum: Patient will transition from heparin to enoxaparin in anticipation of d/c to SNF.  D/C heaprin and associated labs.  Start enoxaparin '100mg'$  SQ q12h.  Continue enoxaparin and warfarin overlap until INR >2. ? ?[employer], Pharm.D., BCPS ?Clinical Pharmacist ? ?**Pharmacist phone directory can be found on [email or web address] listed under [place]. ? ?08/29/2021 2:52 PM ? ? ?

## 2021-08-30 ENCOUNTER — Other Ambulatory Visit (HOSPITAL_COMMUNITY): Payer: Self-pay

## 2021-08-30 DIAGNOSIS — J90 Pleural effusion, not elsewhere classified: Secondary | ICD-10-CM | POA: Diagnosis not present

## 2021-08-30 DIAGNOSIS — J9621 Acute and chronic respiratory failure with hypoxia: Secondary | ICD-10-CM | POA: Diagnosis not present

## 2021-08-30 DIAGNOSIS — J441 Chronic obstructive pulmonary disease with (acute) exacerbation: Secondary | ICD-10-CM | POA: Diagnosis not present

## 2021-08-30 DIAGNOSIS — D500 Iron deficiency anemia secondary to blood loss (chronic): Secondary | ICD-10-CM | POA: Diagnosis not present

## 2021-08-30 LAB — GLUCOSE, CAPILLARY
Glucose-Capillary: 132 mg/dL — ABNORMAL HIGH (ref 70–99)
Glucose-Capillary: 112 mg/dL — ABNORMAL HIGH (ref 70–99)
Glucose-Capillary: 152 mg/dL — ABNORMAL HIGH (ref 70–99)
Glucose-Capillary: 153 mg/dL — ABNORMAL HIGH (ref 70–99)

## 2021-08-30 LAB — HEMOGLOBIN AND HEMATOCRIT, BLOOD
HCT: 25.4 % — ABNORMAL LOW (ref 39.0–52.0)
Hemoglobin: 8.2 g/dL — ABNORMAL LOW (ref 13.0–17.0)

## 2021-08-30 LAB — PROTIME-INR
INR: 1.3 — ABNORMAL HIGH (ref 0.8–1.2)
Prothrombin Time: 15.9 seconds — ABNORMAL HIGH (ref 11.4–15.2)

## 2021-08-30 MED ORDER — FUROSEMIDE 40 MG PO TABS
40.0000 mg | ORAL_TABLET | Freq: Every day | ORAL | Status: DC
  Administered 2021-08-30 – 2021-09-03 (×5): 40 mg via ORAL
  Filled 2021-08-30 (×5): qty 1

## 2021-08-30 MED ORDER — ATORVASTATIN CALCIUM 80 MG PO TABS
80.0000 mg | ORAL_TABLET | Freq: Every day | ORAL | 0 refills | Status: DC
Start: 2021-08-30 — End: 2022-05-20
  Filled 2021-08-30: qty 30, 30d supply, fill #0

## 2021-08-30 MED ORDER — WARFARIN SODIUM 2 MG PO TABS: 4.0000 mg | ORAL_TABLET | Freq: Once | ORAL | Status: DC

## 2021-08-30 MED ORDER — FIDAXOMICIN 200 MG PO TABS
200.0000 mg | ORAL_TABLET | Freq: Two times a day (BID) | ORAL | 0 refills | Status: AC
  Filled 2021-08-30: qty 6, 3d supply, fill #0

## 2021-08-30 MED ORDER — LOSARTAN POTASSIUM 25 MG PO TABS
12.5000 mg | ORAL_TABLET | Freq: Every day | ORAL | 0 refills | Status: DC
Start: 2021-08-30 — End: 2022-02-17
  Filled 2021-08-30: qty 15, 30d supply, fill #0

## 2021-08-30 MED ORDER — ENOXAPARIN SODIUM 100 MG/ML IJ SOSY
100.0000 mg | PREFILLED_SYRINGE | Freq: Two times a day (BID) | INTRAMUSCULAR | 0 refills | Status: DC
  Filled 2021-08-30: qty 20, 10d supply, fill #0

## 2021-08-30 MED ORDER — WARFARIN SODIUM 2.5 MG PO TABS
2.5000 mg | ORAL_TABLET | Freq: Every day | ORAL | Status: DC
  Administered 2021-08-30: 2.5 mg via ORAL
  Filled 2021-08-30: qty 1

## 2021-08-30 NOTE — Progress Notes (Addendum)
ANTICOAGULATION CONSULT NOTE - Follow-Up Consult ? ?Pharmacy Consult IV heparin >> enoxaparin, warfarin ?Indication: atrial fibrillation and hx DVT ? ?No Known Allergies ? ?Patient Measurements: ?Height: 6' (182.9 cm) ?Weight: 92.9 kg (204 lb 12.9 oz) ?IBW/kg (Calculated) : 77.6 ?Heparin dosing weight: 94 kg ? ?Vital Signs: ?Temp: 98.3 ?F (36.8 ?C) (05/06 1610) ?Temp Source: Oral (05/06 9604) ?BP: 127/7 (05/06 5409) ? ?Labs: ?Recent Labs  ?  08/27/21 ?1059 08/28/21 ?0242 08/28/21 ?0242 08/29/21 ?8119 08/30/21 ?0140  ?HGB  --  8.2*   < > 8.0* 8.2*  ?HCT  --  25.6*  --  25.3* 25.4*  ?PLT  --  147*  --  152  --   ?LABPROT  --  14.3  --  14.4 15.9*  ?INR  --  1.1  --  1.1 1.3*  ?HEPARINUNFRC 0.35 0.46  --  0.60  --   ?CREATININE  --   --   --  1.30*  --   ? < > = values in this interval not displayed.  ? ? ? ?Estimated Creatinine Clearance: 58.9 mL/min (A) (by C-G formula based on SCr of 1.3 mg/dL (H)). ? ? ?Medical History: ?Past Medical History:  ?Diagnosis Date  ? Acute combined systolic and diastolic congestive heart failure ([place])   ? Acute on chronic heart failure with preserved ejection fraction (HFpEF) ([place])   ? Acute respiratory failure with hypoxia ([place])   ? Acute respiratory failure with hypoxia ([place])   ? Aortic stenosis   ? moderate in 2022  ? Atrial fibrillation ([place])   ? CHF (congestive heart failure) ([place])   ? Chronic pleural effusion, Left 11/16/2020  ? COPD exacerbation ([place]) 12/09/2020  ? Coronary artery disease   ? Demand ischemia ([place]) 03/26/2021  ? Diabetes mellitus without complication ([place])   ? HLD (hyperlipidemia)   ? Hypertension   ? Long term (current) use of anticoagulants 12/29/2019  ? Malnutrition of moderate degree 05/09/2021  ? Peripheral arterial disease ([place])   ? Pneumonia due to COVID-19 virus 05/29/2021  ? ? ?Assessment: ?71 yo M presenting with dyspnea 2/2 COPD exacerbation. PMH significant for afib on PTA warfarin 2.'5mg'$  daily. Pt s/p R/LHC (5/1) and 2nd toe amputation (5/2). Pt has  been on IV heparin while warfarin was held but is restarting warfarin today. Pharmacy consulted for warfarin dosing with enoxaparin bridge. Will continue bridge until INR is therapeutic. PTA warfarin dose was 2.'5mg'$ . INR today increased to 1.3. Will give one more 4 mg dose, then anticipate home regimen can be resumed. ? ?Goal of Therapy:  ?Heparin level 0.3-0.7 units/ml ?INR 2-2.5 (decreased due to normocytic anemia in past) ?Monitor platelets by anticoagulation protocol: Yes ?  ?Plan:  ?Continue enoxaparin 100 mg q12h ?Give warfarin 4 mg  ?Daily heparin level, CBC, INR ?Monitor for s/s of bleeding ? ?Thank you for allowing pharmacy to participate in this patient's care. ? ?Reatha Harps, PharmD ?PGY1 Pharmacy Resident ?08/30/2021 7:28 AM ?Check [email or web address] for unit specific pharmacy number ? ?ADDENDUM: ? ?Spoke with FM team and plan to discharge to SNF today. Home warfarin regimen was entered and SNF will titrate outpatient and stop enoxaparin when INR is 2 or greater. ? ?Thank you for allowing pharmacy to participate in this patient's care. ? ?Reatha Harps, PharmD ?PGY1 Pharmacy Resident ?08/30/2021 8:08 AM ?Check [email or web address] for unit specific pharmacy number ? ? ? ?

## 2021-08-30 NOTE — Progress Notes (Signed)
FPTS Brief Note ?Reviewed patient's vitals, recent notes.  ?Vitals:  ? 08/30/21 1700 08/30/21 2001  ?BP: 130/72 121/69  ?Pulse: 75   ?Resp: 20   ?Temp: 97.9 ?F (36.6 ?C) 98.7 ?F (37.1 ?C)  ?SpO2: 98%   ? ?At this time, no change in plan from day progress note.  ?Gifford Shave, MD ?Page [phone] with questions about this patient.  ? ? ?

## 2021-08-30 NOTE — Discharge Summary (Signed)
Family Medicine Teaching Service ?Hospital Discharge Summary ? ?Patient name: Rodney [name] Medical record number: [id number] ?Date of birth: 1951-03-21 Age: 71 y.o. Gender: male ?Date of Admission: 08/19/2021  Date of Discharge: 08/31/2021 ?Admitting Physician: Gladys Damme, MD ? ?Primary Care Provider: [name], MD ?Consultants: Cardiology, VVS ? ?Indication for Hospitalization:  ?COPD exacerbation ?C. difficile ?TAVR preop ? ?Discharge Diagnoses/Problem List:  ?Principal Problem: ?  Acute on chronic respiratory failure with hypoxia and hypercapnia (HCC) ?Active Problems: ?  Hypertension associated with chronic kidney disease due to type 2 diabetes mellitus ([place]) ?  DM (diabetes mellitus), type 2 with neurological complications ([place]) ?  HFrEF (heart failure with reduced ejection fraction) ([place]) ?  Severe aortic stenosis ?  AF (paroxysmal atrial fibrillation) ([place]) ?  Hx of BKA, left ([place]) ?  CAD (coronary artery disease) ?  AKI (acute kidney injury) ([place]) ?  Chronic respiratory failure with hypoxia and hypercapnia (HCC) ?  Acute kidney injury superimposed on chronic kidney disease ([place]) ?  Chronic pleural effusion, Left ?  COPD exacerbation ([place]) ?  History of multiple blood transfusions ?  History of amputation of right great toe ([place]) ?  Emphysema of lung ([place]) ?  Atherosclerosis of aorta ([place]) ?  Pleural effusion on right ?  Chronic blood loss anemia ?  ? ?Disposition:  ?Skilled nursing-short term rehab (<3 hours/day)  ?DME: None recommended by PT (08/29/21)  ?   ?Discharge Condition:  ?Stable  ? ?Discharge Exam:  ?Temp:  [97.9 ?F (36.6 ?C)-98.7 ?F (37.1 ?C)] 98.2 ?F (36.8 ?C) (05/07 0008) ?Pulse Rate:  [75-76] 76 (05/07 0008) ?Resp:  [20-22] 22 (05/07 0008) ?BP: (121-133)/(69-75) 133/73 (05/07 0008) ?SpO2:  [94 %-99 %] 99 % (05/07 0008)  ?Physical Exam ?Vitals and nursing note reviewed.  ?Constitutional:   ?   General: He is awake. He is not in acute distress. ?   Appearance: He is ill-appearing.  He is not toxic-appearing or diaphoretic.  ?HENT:  ?   Head: Normocephalic and atraumatic.  ?Cardiovascular:  ?   Rate and Rhythm: Normal rate and regular rhythm.  ?   Heart sounds: Murmur heard.  ?  No friction rub. No gallop.  ?Pulmonary:  ?   Effort: Pulmonary effort is normal. No respiratory distress.  ?   Breath sounds: Decreased air movement present. Examination of the left-middle field reveals rales. Examination of the left-lower field reveals rales. Rales present. No wheezing or rhonchi.  ?Abdominal:  ?   General: Bowel sounds are normal. There is no distension.  ?   Palpations: Abdomen is soft.  ?   Tenderness: There is no abdominal tenderness. There is no guarding or rebound.  ?Musculoskeletal:     ?   General: No tenderness.  ?   Right lower leg: No tenderness. No edema.  ?   Left lower leg: No tenderness. No edema.  ?Skin: ?   General: Skin is warm and dry.  ?Neurological:  ?   General: No focal deficit present.  ?   Mental Status: He is alert and oriented to person, place, and time.  ?Psychiatric:     ?   Behavior: Behavior is cooperative.  ?  ? ?Brief Hospital Course:  ?Rodney [name] is a 71 y.o.male with a history of COPD, HFpEF 2/2 severe aortic stenosis, anemia of chronic disease, CKD 3, PAD s/p BKA, CAD, heart failure with recovered ejection fraction, obesity, diabetes 2.  Who was admitted to the family medicine teaching Service at [facility]  for COPD exacerbation. His hospital course is detailed below: ? ?COPD Exacerbation ?Patient presented in severe respiratory distress requiring BiPAP support in the emergency department.  ABG showed a PCO2 of 72.6.  Patient was started on cefepime and Solu-Medrol.  Cefepime was de-escalated to Cefdinir on 4/27.  He was transitioned from Solu-Medrol to p.o. prednisone on 4/26 and completed a total 5-day course.  Respiratory support was weaned from BiPAP to nasal cannula.  He was weaned to his home requirement of 3 L supplemental O2. ? ?Pleural effusions,  bilateral ?Patient is not a chronic left sided pleural effusion since at least July 2022.  A CT chest was obtained on admission to better characterize the effusion and demonstrated a slight increase in size along with a new moderately sized right pleural effusion. This was improved, but still present.  ? ?Anemia of Chronic Kidney Disease ?Patient's hemoglobin on admission was 9.5.  It dropped to 7.6 on hospital day 2.  He received a 1 unit transfusion on 4/26 with recovery of his hemoglobin to 8.5.  Hemoglobin at discharge was stable at 8. ?  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 VVS amputated 2nd right digit with no complications on May 2nd.  ? ?pAF, rate controlled and AC, stable ?Warfarin ? ?Severe Aortic Stenosis ?Cardiology was consulted and ultimately decided to proceed with TAVR work-up after discussing with vascular surgery in multidisciplinary heart valve team given his significant comorbidities including advanced chronic lung disease and severe peripheral arterial disease and multiple revascularization surgeries.  ?Work-up involved amputation see above.  ? ?Other chronic conditions were medically managed with home medications and formulary alternatives as necessary (GERD, insomnia) ? ?PCP Follow-up Recommendations: ?1. Increasing size of mediastinal lymph nodes, while potentially reactive show increase over time since previous imaging in the RIGHT paratracheal chain. Consider short interval follow-up or PET imaging ?for further evaluation outside of the acute setting, in 4-6 weeks. ?2. Given IV iron while hospitalized. Likely anemia of chronic disease. PCP to follow up.  ?3. Last work up is oral surgery before TAVR. Frederik Schmidt, MD consulted and agreed to OP f/u before TAVR. Needs transportation assistance, TOC was consulted.  ?4. Concerned for depression 2/2 medical  problems, started on trazodone for sleep, increase to >'150mg'$  for depression or switching to 1st line SSRI or consider trazodone as adjunct to SSRI. Prozac is great for compliance.  ? ? ?Significant Labs and Imaging:  ?Recent Labs  ?Lab 08/27/21 ?0127 08/28/21 ?0242 08/29/21 ?3536 08/30/21 ?0140  ?WBC 7.6 6.5 5.8  --   ?HGB 8.2* 8.2* 8.0* 8.2*  ?HCT 25.9* 25.6* 25.3* 25.4*  ?PLT 157 147* 152  --   ? ?Recent Labs  ?Lab 08/25/21 ?0134 08/25/21 ?1508 08/25/21 ?1518 08/26/21 ?0450 08/27/21 ?0127 08/29/21 ?0334  ?NA 135 139  137 137 134* 135 135  ?K 3.9 3.4*  3.5 3.5 3.3* 4.0 3.8  ?CL 96*  --   --  95* 99 98  ?CO2 33*  --   --  32 31 31  ?GLUCOSE 163*  --   --  169* 246* 171*  ?BUN 34*  --   --  29* 25* 20  ?CREATININE 1.45*  --   --  1.36* 1.25* 1.30*  ?CALCIUM 8.3*  --   --  8.0* 7.9* 8.0*  ? ?EKG Interpretation ?Date/Time:  Tuesday August 19 2021 05:38:30 EDT ?Ventricular Rate:  108 ?PR Interval:  152 ?QRS Duration: 111 ?QT Interval:  361 ?QTC Calculation: 484 ?R Axis:   47 ?Text Interpretation: Ectopic atrial tachycardia, unifocal Ventricular premature complex Repol abnrm, severe global ischemia (LM/MVD) ST depressions similar to prior EKG Confirmed by Thayer Jew [phone]) on 08/19/2021 5:43:15 AM ?  ?Results for orders placed or performed during the hospital encounter of 08/19/21 (from the past 24 hour(s))  ?Glucose, capillary     Status: Abnormal  ? Collection Time: 08/30/21 11:44 AM  ?Result Value Ref Range  ? Glucose-Capillary 112 (H) 70 - 99 mg/dL  ?Glucose, capillary     Status: Abnormal  ? Collection Time: 08/30/21  3:55 PM  ?Result Value Ref Range  ? Glucose-Capillary 152 (H) 70 - 99 mg/dL  ?Glucose, capillary     Status: Abnormal  ? Collection Time: 08/30/21  8:55 PM  ?Result Value Ref Range  ? Glucose-Capillary 153 (H) 70 - 99 mg/dL  ?Glucose, capillary     Status: Abnormal  ? Collection Time: 08/31/21 12:02 AM  ?Result Value Ref Range  ? Glucose-Capillary 150 (H) 70 - 99 mg/dL  ?Protime-INR     Status:  None  ? Collection Time: 08/31/21  2:38 AM  ?Result Value Ref Range  ? Prothrombin Time 13.9 11.4 - 15.2 seconds  ? INR 1.1 0.8 - 1.2  ?  ?DG Orthopantogram ? ?Result Date: 08/21/2021 ?CLINICAL DATA:  Preoperative t

## 2021-08-30 NOTE — Progress Notes (Signed)
PT Cancellation Note ? ?Patient Details ?Name: Rodney [name] ?MRN: [id number] ?DOB: December 13, 1950 ? ? ?Cancelled Treatment:    Reason Eval/Treat Not Completed: Other (comment). Pt was asleep, unable to awaken, retry at another time. ? ? ?Ramond Dial ?08/30/2021, 3:02 PM ? ?Mee Hives, PT PhD ?Acute Rehab Dept. Number: [facility] [phone] and [place] [phone] ? ?

## 2021-08-30 NOTE — Progress Notes (Signed)
FPTS Brief Progress Note ? ?S:Went to bedside to see patient, patient sleeping comfortably. ? ? ?O: ?BP (!) 144/70 (BP Location: Left Arm)   Pulse 75   Temp 98.3 ?F (36.8 ?C) (Oral)   Resp 17   Ht 6' (1.829 m)   Wt 93.4 kg   SpO2 100%   BMI 27.93 kg/m?   ? ? ?A/P: ?- Plans per day team ?- Orders reviewed. Labs for AM ordered, which was adjusted as needed.  ? ? ?Holley Bouche, MD ?08/30/2021, 4:28 AM ?PGY-1, [place] [facility] Night Resident  ?Please page [phone] with questions.  ? ? ?

## 2021-08-30 NOTE — Progress Notes (Signed)
Family Medicine Teaching Service ?Daily Progress Note ?Intern Pager: [phone] ? ?Patient name: Rodney [name] Medical record number: [id number] ?Date of birth: Sep 17, 1950 Age: 71 y.o. Gender: male ? ?Primary Care Provider: [name], MD ?Consultants: Cardiology, vascular surgery ?Code Status: Full code ? ?Pt Overview and Major Events to Date:  ?4/25-admitted, placed on BiPAP briefly then weaned to nasal cannula, started cefepime and given IV methylprednisolone ?4/26-transitioned steroids to oral prednisone, transfused 1u PRBC ?4/27-cefepime transitioned to cefdinir ?4/29-completed course of cefdinir and prednisone ?5/1-B/l heart cath  ?5/2-Right toe amputation ?5/5-restarted warfarin with heparin bridge ?5/6-d/c to snf delayed to due insurance approval ?5/7-d/c to snf ?  ? ?Assessment and Plan: ?Rodney [name] is a 72-year initially admitted for COPD exacerbation (now resolved).  Now experiencing C. difficile recurrence and undergoing preop preparation for TAVR, which includes L+RHC and amputation of gangrenous toe. PMHx significant for: COPD, HFrEF 2/2 severe AS, CAD, PAD s/p left BKA, PAF on warfarin, T2DM, CKD stage III, anemia of chronic disease, protein calorie malnutrition. ? ?C. difficile recurrence, improving ?- Continued Pradaxa mycin 200 mg BID x10d, end date 09/02/21  ? ?HFpEF 2/2 Severe AS & CAD s/p L&R Heart Cath (5/1), stable ?Euvolemic. Baseline 3L O2 Gem. ?- Cardiology consulted appreciate assistance and recs ?- Continued PO lasix 40 mg  ?- Losartan, coreg ? ?pAF, rate controlled and AC, stable ?- Continued Coreg 6.25 mg BID  ?- Continued warfarin per pharmacy with heparin bridge ? ?Gangrene of the right second toe 2/2 PAD s/p left BKA and right great toe amputation 5/2 (08/26/2021)  ?- Coreg per above ?- plavix ?- Dressing changes per surgery ?- Heel weight bearing only with darco shoe, per surg ?- f/u in 3-4 weeks for wound check/suture removal ? ?Poor dentition ?Required teeth extraction for  TAVR prep.  Patient will have to follow up with oral surgery outpatient. ?- TOC consulted for transportation assistance ?- Consulted oral surgery, f/u outpatient ? ?Anemia of CKD and iron deficiency s/p IV iron, stable ?- Transfusion if Hb <8 ? ?Acute on chronic hypoxemic respiratory failure secondary to COPD exacerbation; resolved ?Baseline 3L  . Completed prednisone and cefdinir x5d 4/30.  Patient declined turning down his oxygen from 3 L despite being 100% SPO2. ?- Duo nebs BID ?- Continued Breo Ellipta daily ?- O2 goal: 88-92% ? ?CKD III, improving and stable ?Stable, baseline Cr 1.30 ? ?T2DM, controlled ?A1c 5.9 (08/20/2021). ?- Continued Levemir 15u daily ?- mSSI ?   ?Mediastinal lymph nodes ?- Outpatient PET follow-up ?  ?GERD ?- Protonix 40 mg ?  ?Insomnia ?- Trazodone nightly sch ? ?De-Conditioning ?- OOB order ?- Encourage mobilization ? ?FEN/GI: Heart healthy ?PPx: Protonix ? ?Dispo: Skilled nursing-short term rehab (<3 hours/day) -patient refused SNF in the past, may require PT reevaluation.   ?Barrier: SNF warfarin and IV Fe and antibiotic price ?DMEs:  None recommended by PT (08/29/21)  ? ?Subjective:  ?No family at bedside. ? ?Patient denied pain, dysuria, abdominal pain, chest pain, SOB, bowel movements, bloating.  He has no other concerns. ? ?Objective: ?Temp:  [97.9 ?F (36.6 ?C)-98.7 ?F (37.1 ?C)] 98.7 ?F (37.1 ?C) (05/06 2001) ?Pulse Rate:  [75-76] 75 (05/06 1700) ?Resp:  [20-22] 20 (05/06 1700) ?BP: (121-130)/(7-75) 121/69 (05/06 2001) ?SpO2:  [94 %-99 %] 98 % (05/06 1700) ?Weight:  [92.9 kg] 92.9 kg (05/06 0625) ?Physical Exam: ?General: Awake, alert, no acute distress  ?Cardiovascular: murmur, reg rate ?Respiratory: Crackles left lung, throughout. Normal breathing efforts.  ?Abdomen: Soft, nondistended, nontender ? ?  Laboratory: ?Recent Labs  ?Lab 08/27/21 ?0127 08/28/21 ?0242 08/29/21 ?0488 08/30/21 ?0140  ?WBC 7.6 6.5 5.8  --   ?HGB 8.2* 8.2* 8.0* 8.2*  ?HCT 25.9* 25.6* 25.3* 25.4*  ?PLT 157  147* 152  --   ? ?Recent Labs  ?Lab 08/26/21 ?0450 08/27/21 ?0127 08/29/21 ?8916  ?NA 134* 135 135  ?K 3.3* 4.0 3.8  ?CL 95* 99 98  ?CO2 32 31 31  ?BUN 29* 25* 20  ?CREATININE 1.36* 1.25* 1.30*  ?CALCIUM 8.0* 7.9* 8.0*  ?GLUCOSE 169* 246* 171*  ? ?Imaging/Diagnostic Tests: ?No results found.  ? ?Merrily Brittle, DO ?08/30/2021, 9:53 PM ?PGY-1, [place] ?[place] Intern pager: [phone], text pages welcome ?

## 2021-08-31 DIAGNOSIS — J9622 Acute and chronic respiratory failure with hypercapnia: Secondary | ICD-10-CM | POA: Diagnosis not present

## 2021-08-31 DIAGNOSIS — J441 Chronic obstructive pulmonary disease with (acute) exacerbation: Secondary | ICD-10-CM | POA: Diagnosis not present

## 2021-08-31 DIAGNOSIS — J9621 Acute and chronic respiratory failure with hypoxia: Secondary | ICD-10-CM | POA: Diagnosis not present

## 2021-08-31 LAB — GLUCOSE, CAPILLARY
Glucose-Capillary: 119 mg/dL — ABNORMAL HIGH (ref 70–99)
Glucose-Capillary: 120 mg/dL — ABNORMAL HIGH (ref 70–99)
Glucose-Capillary: 125 mg/dL — ABNORMAL HIGH (ref 70–99)
Glucose-Capillary: 150 mg/dL — ABNORMAL HIGH (ref 70–99)
Glucose-Capillary: 187 mg/dL — ABNORMAL HIGH (ref 70–99)

## 2021-08-31 LAB — PROTIME-INR
INR: 1.1 (ref 0.8–1.2)
Prothrombin Time: 13.9 seconds (ref 11.4–15.2)

## 2021-08-31 MED ORDER — WARFARIN SODIUM 2.5 MG PO TABS: 2.5000 mg | ORAL_TABLET | Freq: Every day | ORAL | Status: DC

## 2021-08-31 MED ORDER — WARFARIN SODIUM 5 MG PO TABS
5.0000 mg | ORAL_TABLET | Freq: Once | ORAL | Status: AC
  Administered 2021-08-31: 5 mg via ORAL
  Filled 2021-08-31: qty 1

## 2021-08-31 NOTE — TOC Progression Note (Signed)
Transition of Care (TOC) - Progression Note  ? ? ?Patient Details  ?Name: Rodney [name] ?MRN: [id number] ?Date of Birth: [birth year] ? ?Transition of Care (TOC) CM/SW Contact  ?Bary Castilla, LCSW ?Phone Number: [phone] ?08/31/2021, 8:55 AM ? ?Clinical Narrative:    ? ?CSW checked Navi portal and pt's authorization is still pending. ? ?TOC team will continue to assist with discharge planning needs.  ?Expected Discharge Plan: [place] ?Barriers to Discharge: Continued Medical Work up, Ship broker ? ?Expected Discharge Plan and Services ?Expected Discharge Plan: [place] ?  ?Discharge Planning Services: CM Consult ?Post Acute Care Choice: [place] ?Living arrangements for the past 2 months: [place] ?Expected Discharge Date: 08/31/21               ?  ?DME Agency: NA ?  ?  ?  ?HH Arranged: RN, PT, OT ?[place] Agency: [place] ?Date HH Agency Contacted: 08/20/21 ?Time [place]: 0947 ?Representative spoke with at [place]: Tommi Rumps ? ? ?Social Determinants of Health (SDOH) Interventions ?  ? ?Readmission Risk Interventions ? ?  08/21/2021  ?  5:10 PM 06/26/2021  ?  4:36 PM 05/16/2021  ?  3:13 PM  ?Readmission Risk Prevention Plan  ?Transportation Screening Complete Complete Complete  ?Medication Review Press photographer) Complete Complete Complete  ?PCP or Specialist appointment within 3-5 days of discharge Complete Complete Complete  ?[place] or Home Care Consult Complete Complete Complete  ?SW Recovery Care/Counseling Consult Complete Complete Complete  ?Palliative Care Screening Not Applicable Not Applicable Not Applicable  ?[place] Not Applicable Complete Not Applicable  ? ? ?

## 2021-08-31 NOTE — TOC Progression Note (Signed)
Transition of Care (TOC) - Progression Note  ? ? ?Patient Details  ?Name: Rodney [name] ?MRN: [id number] ?Date of Birth: August 17, 1950 ? ?Transition of Care (TOC) CM/SW Contact  ?Bary Castilla, LCSW ?Phone Number:[phone] ?08/31/2021, 2:58 PM ? ?Clinical Narrative:    ? ?CSW checked Navi portal and pt's authorization is still pending. ? ?TOC team will continue to assist with discharge planning needs.  ? ?Expected Discharge Plan: [place] ?Barriers to Discharge: Continued Medical Work up, Ship broker ? ?Expected Discharge Plan and Services ?Expected Discharge Plan: [place] ?  ?Discharge Planning Services: CM Consult ?Post Acute Care Choice: [place] ?Living arrangements for the past 2 months: [place] ?Expected Discharge Date: 08/31/21               ?  ?DME Agency: NA ?  ?  ?  ?HH Arranged: RN, PT, OT ?[place] Agency: [place] ?Date HH Agency Contacted: 08/20/21 ?Time [place]: 2446 ?Representative spoke with at [place]: Tommi Rumps ? ? ?Social Determinants of Health (SDOH) Interventions ?  ? ?Readmission Risk Interventions ? ?  08/21/2021  ?  5:10 PM 06/26/2021  ?  4:36 PM 05/16/2021  ?  3:13 PM  ?Readmission Risk Prevention Plan  ?Transportation Screening Complete Complete Complete  ?Medication Review Press photographer) Complete Complete Complete  ?PCP or Specialist appointment within 3-5 days of discharge Complete Complete Complete  ?[place] or Home Care Consult Complete Complete Complete  ?SW Recovery Care/Counseling Consult Complete Complete Complete  ?Palliative Care Screening Not Applicable Not Applicable Not Applicable  ?[place] Not Applicable Complete Not Applicable  ? ? ?

## 2021-08-31 NOTE — Progress Notes (Addendum)
ANTICOAGULATION CONSULT NOTE - Follow-Up Consult ? ?Pharmacy Consult IV heparin >> enoxaparin, warfarin ?Indication: atrial fibrillation and hx DVT ? ?No Known Allergies ? ?Patient Measurements: ?Height: 6' (182.9 cm) ?Weight: 92.9 kg (204 lb 12.9 oz) ?IBW/kg (Calculated) : 77.6 ?Heparin dosing weight: 94 kg ? ?Vital Signs: ?Temp: 98.2 ?F (36.8 ?C) (05/07 0008) ?Temp Source: Oral (05/07 4540) ?BP: 115/54 (05/07 9811) ?Pulse Rate: 74 (05/07 0634) ? ?Labs: ?Recent Labs  ?  08/29/21 ?9147 08/30/21 ?0140 08/31/21 ?8295  ?HGB 8.0* 8.2*  --   ?HCT 25.3* 25.4*  --   ?PLT 152  --   --   ?LABPROT 14.4 15.9* 13.9  ?INR 1.1 1.3* 1.1  ?HEPARINUNFRC 0.60  --   --   ?CREATININE 1.30*  --   --   ? ? ? ?Estimated Creatinine Clearance: 58.9 mL/min (A) (by C-G formula based on SCr of 1.3 mg/dL (H)). ? ? ?Medical History: ?Past Medical History:  ?Diagnosis Date  ? Acute combined systolic and diastolic congestive heart failure ([place])   ? Acute on chronic heart failure with preserved ejection fraction (HFpEF) ([place])   ? Acute respiratory failure with hypoxia ([place])   ? Acute respiratory failure with hypoxia ([place])   ? Aortic stenosis   ? moderate in 2022  ? Atrial fibrillation ([place])   ? CHF (congestive heart failure) ([place])   ? Chronic pleural effusion, Left 11/16/2020  ? COPD exacerbation ([place]) 12/09/2020  ? Coronary artery disease   ? Demand ischemia ([place]) 03/26/2021  ? Diabetes mellitus without complication ([place])   ? HLD (hyperlipidemia)   ? Hypertension   ? Long term (current) use of anticoagulants 12/29/2019  ? Malnutrition of moderate degree 05/09/2021  ? Peripheral arterial disease ([place])   ? Pneumonia due to COVID-19 virus 05/29/2021  ? ? ?Assessment: ?71 yo M presenting with dyspnea 2/2 COPD exacerbation. PMH significant for afib on PTA warfarin 2.'5mg'$  daily. Pt s/p R/LHC (5/1) and 2nd toe amputation (5/2). Pt has been on IV heparin while warfarin was held but is restarting warfarin today. Pharmacy consulted for warfarin dosing with  enoxaparin bridge. Will continue bridge until INR is therapeutic. PTA warfarin dose was 2.'5mg'$  daily. Initial warfarin plan was changed to PTA regimen yesterday with anticipated discharge to SNF, however, patient discharge was delayed until today. Will give supplemental dose now, then continue home regimen with anticipated discharge to SNF. ? ?Goal of Therapy:  ?Heparin level 0.3-0.7 units/ml ?INR 2-2.5 (decreased due to normocytic anemia in past) ?Monitor platelets by anticoagulation protocol: Yes ?  ?Plan:  ?Continue enoxaparin 100 mg q12h ?Give warfarin 2.5 mg x 1 now  ?Give warfarin 2.5 mg daily ?Daily heparin level, CBC, INR ?Monitor for s/s of bleeding ? ?Thank you for allowing pharmacy to participate in this patient's care. ? ?Reatha Harps, PharmD ?PGY1 Pharmacy Resident ?08/31/2021 7:39 AM ?Check [email or web address] for unit specific pharmacy number ? ?ADDENDUM: ?Warfarin not given this morning for unknown reason. Still pending acceptance to SNF. With unknown plans for dc this evening, will schedule 5 mg tonight and home regimen to start tomorrow. ? ? ?

## 2021-08-31 NOTE — TOC Progression Note (Signed)
Transition of Care (TOC) - Progression Note  ? ? ?Patient Details  ?Name: Rodney [name] ?MRN: [id number] ?Date of Birth: 1950-06-07 ? ?Transition of Care (TOC) CM/SW Contact  ?Bary Castilla, LCSW ?Phone Number:[phone] ?08/31/2021, 10:58 AM ? ?Clinical Narrative:    ?Per report of CSW -[place] received a call from [facility] stating that they needed updated PT/OT notes. It was only one note in file therefore CSW sent OT notes as well as recent MD notes and uploaded it in portal. Still pending decision. ? ?TOC team will continue to assist with discharge planning needs.  ? ? ?Expected Discharge Plan: [place] ?Barriers to Discharge: Continued Medical Work up, Ship broker ? ?Expected Discharge Plan and Services ?Expected Discharge Plan: [place] ?  ?Discharge Planning Services: CM Consult ?Post Acute Care Choice: [place] ?Living arrangements for the past 2 months: [place] ?Expected Discharge Date: 08/31/21               ?  ?DME Agency: NA ?  ?  ?  ?HH Arranged: RN, PT, OT ?[place] Agency: [place] ?Date HH Agency Contacted: 08/20/21 ?Time [place]: 5329 ?Representative spoke with at [place]: Tommi Rumps ? ? ?Social Determinants of Health (SDOH) Interventions ?  ? ?Readmission Risk Interventions ? ?  08/21/2021  ?  5:10 PM 06/26/2021  ?  4:36 PM 05/16/2021  ?  3:13 PM  ?Readmission Risk Prevention Plan  ?Transportation Screening Complete Complete Complete  ?Medication Review Press photographer) Complete Complete Complete  ?PCP or Specialist appointment within 3-5 days of discharge Complete Complete Complete  ?[place] or Home Care Consult Complete Complete Complete  ?SW Recovery Care/Counseling Consult Complete Complete Complete  ?Palliative Care Screening Not Applicable Not Applicable Not Applicable  ?[place] Not Applicable Complete Not Applicable  ? ? ?

## 2021-08-31 NOTE — Progress Notes (Signed)
Family Medicine Teaching Service ?Daily Progress Note ?Intern Pager: [phone] ? ?Patient name: Rodney [name] Medical record number: [id number] ?Date of birth: 30-May-1950 Age: 71 y.o. Gender: male ? ?Primary Care Provider: [name], MD ?Consultants: Cardiology, vascular surgery ?Code Status: Full code ? ?Pt Overview and Major Events to Date:  ?4/25-admitted, placed on BiPAP briefly then weaned to nasal cannula, started cefepime and given IV methylprednisolone ?4/26-transitioned steroids to oral prednisone, transfused 1u PRBC ?4/27-cefepime transitioned to cefdinir ?4/29-completed course of cefdinir and prednisone ?5/1-B/l heart cath  ?5/2-Right toe amputation ?5/5-restarted warfarin with heparin bridge ?5/6-d/c to snf delayed to due insurance approval ?5/7-d/c to snf ?  ?Assessment and Plan: ?Rodney [name] is a 76-year initially admitted for COPD exacerbation (now resolved).  Now experiencing C. difficile recurrence and undergoing preop preparation for TAVR, which includes L+RHC and amputation of gangrenous toe. PMHx significant for: COPD, HFrEF 2/2 severe AS, CAD, PAD s/p left BKA, PAF on warfarin, T2DM, CKD stage III, anemia of chronic disease, protein calorie malnutrition. ?  ?Patient is medically stable for discharge to SNF ? ?C. difficile recurrence, improving ?Continue Pradaxa mycin 200 mg BID x10d, end date 09/02/21  ?  ?HFpEF 2/2 Severe AS & CAD s/p L&R Heart Cath (5/1), stable ?Continue Lasix, losartan, Coreg. ?  ?pAF, rate controlled and AC, stable ?Continue home Coreg and warfarin per pharmacy with heparin bridge ? ?Poor dentition ?Required teeth extraction for TAVR prep.  Patient will have to follow up with oral surgery outpatient. ?  ?Anemia of CKD and iron deficiency s/p IV iron, stable ?Stable, 8.2 on 5/6 ?  ?Acute on chronic hypoxemic respiratory failure secondary to COPD exacerbation; resolved ?At baseline oxygen ?- Duo nebs BID ?- Continued Breo Ellipta daily ?- O2 goal: 88-92% ?  ?CKD  III, improving and stable ?Stable, baseline Cr 1.30 ?  ?T2DM, controlled ?A1c 5.9 (08/20/2021).  Continue Levemir 15 units daily and moderate sliding scale. ? ?Mediastinal lymph nodes ?Outpatient PET follow-up ?  ?GERD ?Continue protonix 40 mg ?  ?Insomnia ?Continue nightly trazodone scheduled ?  ?De-Conditioning ?- OOB order ?- Encourage mobilization ?  ? ?FEN/GI: Heart healthy ?PPx: Protonix ?Dispo:SNF tomorrow. Barriers include insurance authorization.  ? ?Subjective:  ?Patient evaluated by Drs. Cresenzo and McDiarmid this morning. See attestation for subjective.  ? ?Objective: ?Temp:  [97.9 ?F (36.6 ?C)-98.7 ?F (37.1 ?C)] 98.2 ?F (36.8 ?C) (05/07 0008) ?Pulse Rate:  [75-76] 76 (05/07 0008) ?Resp:  [20-22] 22 (05/07 0008) ?BP: (121-133)/(69-75) 133/73 (05/07 0008) ?SpO2:  [94 %-99 %] 99 % (05/07 0008)  ?Physical Exam ?Vitals and nursing note reviewed.  ?Constitutional:   ?   General: He is awake. He is not in acute distress. ?   Appearance: He is ill-appearing. He is not toxic-appearing or diaphoretic.  ?HENT:  ?   Head: Normocephalic and atraumatic.  ?Cardiovascular:  ?   Rate and Rhythm: Normal rate and regular rhythm.  ?   Heart sounds: Murmur heard.  ?  No friction rub. No gallop.  ?Pulmonary:  ?   Effort: Pulmonary effort is normal. No respiratory distress.  ?   Breath sounds: Decreased air movement present. Examination of the left-middle field reveals rales. Examination of the left-lower field reveals rales. Rales present. No wheezing or rhonchi.  ?Abdominal:  ?   General: Bowel sounds are normal. There is no distension.  ?   Palpations: Abdomen is soft.  ?   Tenderness: There is no abdominal tenderness. There is no guarding or rebound.  ?Musculoskeletal:     ?  General: No tenderness.  ?   Right lower leg: No tenderness. No edema.  ?   Left lower leg: No tenderness. No edema.  ?Skin: ?   General: Skin is warm and dry.  ?Neurological:  ?   General: No focal deficit present.  ?   Mental Status: He is alert  and oriented to person, place, and time.  ?Psychiatric:     ?   Behavior: Behavior is cooperative.  ? ?Laboratory: ?Recent Labs  ?Lab 08/27/21 ?0127 08/28/21 ?0242 08/29/21 ?9379 08/30/21 ?0140  ?WBC 7.6 6.5 5.8  --   ?HGB 8.2* 8.2* 8.0* 8.2*  ?HCT 25.9* 25.6* 25.3* 25.4*  ?PLT 157 147* 152  --   ? ?Recent Labs  ?Lab 08/26/21 ?0450 08/27/21 ?0127 08/29/21 ?0240  ?NA 134* 135 135  ?K 3.3* 4.0 3.8  ?CL 95* 99 98  ?CO2 32 31 31  ?BUN 29* 25* 20  ?CREATININE 1.36* 1.25* 1.30*  ?CALCIUM 8.0* 7.9* 8.0*  ?GLUCOSE 169* 246* 171*  ? ? ? ?Rodney Essex, MD ?08/31/2021, 9:31 PM ?PGY-2, [place] ?[place] Intern pager: [phone], text pages welcome  ?

## 2021-09-01 ENCOUNTER — Other Ambulatory Visit: Payer: Self-pay | Admitting: Family Medicine

## 2021-09-01 ENCOUNTER — Other Ambulatory Visit (HOSPITAL_COMMUNITY): Payer: Self-pay

## 2021-09-01 DIAGNOSIS — I480 Paroxysmal atrial fibrillation: Secondary | ICD-10-CM | POA: Diagnosis not present

## 2021-09-01 DIAGNOSIS — J9621 Acute and chronic respiratory failure with hypoxia: Secondary | ICD-10-CM | POA: Diagnosis not present

## 2021-09-01 DIAGNOSIS — J9622 Acute and chronic respiratory failure with hypercapnia: Secondary | ICD-10-CM | POA: Diagnosis not present

## 2021-09-01 DIAGNOSIS — I5020 Unspecified systolic (congestive) heart failure: Secondary | ICD-10-CM

## 2021-09-01 LAB — PROTIME-INR
INR: 1.1 (ref 0.8–1.2)
Prothrombin Time: 14.4 seconds (ref 11.4–15.2)

## 2021-09-01 LAB — GLUCOSE, CAPILLARY
Glucose-Capillary: 81 mg/dL (ref 70–99)
Glucose-Capillary: 99 mg/dL (ref 70–99)
Glucose-Capillary: 99 mg/dL (ref 70–99)
Glucose-Capillary: 165 mg/dL — ABNORMAL HIGH (ref 70–99)

## 2021-09-01 MED ORDER — IPRATROPIUM-ALBUTEROL 0.5-2.5 (3) MG/3ML IN SOLN: 3.0000 mL | Freq: Four times a day (QID) | RESPIRATORY_TRACT | Status: DC | PRN

## 2021-09-01 MED ORDER — WARFARIN SODIUM 5 MG PO TABS
5.0000 mg | ORAL_TABLET | Freq: Once | ORAL | Status: AC
  Administered 2021-09-01: 5 mg via ORAL
  Filled 2021-09-01: qty 1

## 2021-09-01 MED ORDER — WARFARIN SODIUM 2.5 MG PO TABS: 2.5000 mg | ORAL_TABLET | Freq: Every day | ORAL | Status: DC

## 2021-09-01 NOTE — Care Management Important Message (Signed)
Important Message ? ?Patient Details  ?Name: Rodney [name] ?MRN: [id number] ?Date of Birth: 1950/06/14 ? ? ?Medicare Important Message Given:  Yes ? ? ? ? ?Shelda Altes ?09/01/2021, 8:21 AM ?

## 2021-09-01 NOTE — Progress Notes (Signed)
Family Medicine Teaching Service ?Daily Progress Note ?Intern Pager: [phone] ? ?Patient name: Rodney [name] Medical record number: [id number] ?Date of birth: March 12, 1951 Age: 71 y.o. Gender: male ? ?Primary Care Provider: [name], MD ?Consultants: Cardiology, vascular surgery ?Code Status: Full code ? ?Pt Overview and Major Events to Date:  ?4/25-admitted, placed on BiPAP briefly then weaned to nasal cannula, started cefepime and given IV methylprednisolone ?4/26-transitioned steroids to oral prednisone, transfused 1u PRBC ?4/27-cefepime transitioned to cefdinir ?4/29-completed course of cefdinir and prednisone ?5/1-B/l heart cath  ?5/2-Right toe amputation ?5/5-restarted warfarin with heparin bridge ?5/6-d/c to snf delayed to due insurance approval ?5/7-pending insurance approval for SNF ?  ?Assessment and Plan: ?Rodney [name] is a 25-year initially admitted for COPD exacerbation (now resolved).  Now experiencing C. difficile recurrence and undergoing preop preparation for TAVR, which includes L+RHC and amputation of gangrenous toe. PMHx significant for: COPD, HFrEF 2/2 severe AS, CAD, PAD s/p left BKA, PAF on warfarin, T2DM, CKD stage III, anemia of chronic disease, protein calorie malnutrition. ?  ?Patient is medically stable for discharge to SNF ? ?C. difficile recurrence, resolved ?Continue Pradaxa mycin 200 mg BID x10d, end date 09/02/21 ?No diarrhea, no abdominal pain, last BM 5/7. ?If still here on 5/9, consider DC enteric precautions ?  ?HFpEF 2/2 Severe AS & CAD s/p L&R Heart Cath (5/1), stable ?Euvolemic, baseline O2 requirement 2 L nasal cannula.  Adequate urine output. At dry weight 93 kg ?Continue Lasix, losartan, Coreg. ? ?AF, rate controlled and AC, stable ?HR within normal limits.   ?Continue home Coreg and warfarin per pharmacy with Lovenox bridge ? ?PAD s/p left BKA and R great toe amputation (08/26/2021)  ?Coreg, plavix ?Dressing changes per surgery ?Heel weight bearing only with darco  shoe, per surg ?f/u in 3-4 weeks for wound check/suture removal ? ?Poor dentition ?Required teeth extraction for TAVR prep.  Patient will have to follow up with oral surgery outpatient. ?  ?Anemia of CKD and iron deficiency s/p IV iron, stable ?Stable, 8.2 on 5/6 ?  ?Acute on chronic hypoxemic respiratory failure secondary to COPD exacerbation; resolved ?At baseline oxygen ?Duo nebs BID ?Continued Breo Ellipta daily ?O2 goal: 88-92% ?  ?CKD III, improving and stable ?Stable, baseline Cr 1.30 ?  ?T2DM, controlled ?A1c 5.9 (08/20/2021).  7 units short acting.  CBGs 112-150s. ?Continue Levemir 15 units daily and moderate sliding scale. ? ?Mediastinal lymph nodes ?Outpatient PET follow-up ?  ?GERD ?Continue protonix 40 mg ?  ?Insomnia ?Continue nightly trazodone scheduled ?  ?De-Conditioning ?OOB order ?Encourage mobilization ?  ? ?FEN/GI: Heart healthy ?PPx: Protonix ?VTE: Scd's start: 08/25/21 1641, per pharm warfarin (COUMADIN) tablet 5 mg  ?warfarin (COUMADIN) tablet 2.5 mg  and lovenox bridge ? ? ?Dispo: Skilled nursing-short term rehab (<3 hours/day)  ?DME: None recommended by PT (08/29/21) ?Barrier: Ship broker.  ? ?Subjective:  ?No family at bedside. ? ?No acute events overnight. ?Patient is seen resting comfortably, awake, alert.  Last bowel movement was about a day and a half ago.  No diarrhea, no SOB, no pain, no chest pain.  Patient has no other concerns. ? ?Objective: ?Temp:  [98 ?F (36.7 ?C)-98.2 ?F (36.8 ?C)] 98.1 ?F (36.7 ?C) (05/08 6606) ?Pulse Rate:  [50-75] 74 (05/08 3016) ?Cardiac Rhythm: Atrial flutter;Other (Comment) (05/07 1900) ?Resp:  [21-23] 23 (05/08 0109) ?BP: (117-128)/(65-70) 117/65 (05/08 3235) ?SpO2:  [96 %-100 %] 98 % (05/08 0921)  ?Physical Exam ?Vitals and nursing note reviewed.  ?Constitutional:   ?  General: He is awake. He is not in acute distress. ?   Appearance: He is not ill-appearing, toxic-appearing or diaphoretic.  ?HENT:  ?   Head: Normocephalic and atraumatic.   ?Cardiovascular:  ?   Rate and Rhythm: Normal rate and regular rhythm.  ?   Heart sounds: Murmur heard.  ?  No friction rub. No gallop.  ?Pulmonary:  ?   Effort: Pulmonary effort is normal. No respiratory distress.  ?   Breath sounds: Decreased breath sounds and wheezing (very mild end expiratory wheeze) present. No rales.  ?Abdominal:  ?   General: Bowel sounds are normal.  ?   Palpations: Abdomen is soft.  ?   Tenderness: There is no abdominal tenderness. There is no guarding or rebound.  ?Musculoskeletal:  ?   Right lower leg: No tenderness.  ?   Left lower leg: No tenderness.  ?Skin: ?   General: Skin is warm and dry.  ?Neurological:  ?   General: No focal deficit present.  ?   Mental Status: He is alert and oriented to person, place, and time.  ?Psychiatric:     ?   Behavior: Behavior is cooperative.  ?  ? ?Laboratory: ?Recent Labs  ?Lab 08/27/21 ?0127 08/28/21 ?0242 08/29/21 ?3790 08/30/21 ?0140  ?WBC 7.6 6.5 5.8  --   ?HGB 8.2* 8.2* 8.0* 8.2*  ?HCT 25.9* 25.6* 25.3* 25.4*  ?PLT 157 147* 152  --   ? ?Recent Labs  ?Lab 08/26/21 ?0450 08/27/21 ?0127 08/29/21 ?2409  ?NA 134* 135 135  ?K 3.3* 4.0 3.8  ?CL 95* 99 98  ?CO2 32 31 31  ?BUN 29* 25* 20  ?CREATININE 1.36* 1.25* 1.30*  ?CALCIUM 8.0* 7.9* 8.0*  ?GLUCOSE 169* 246* 171*  ? ? ?Merrily Brittle, DO ?09/01/2021, 9:42 AM ?PGY-1, [place] ?[place] Intern pager: [phone], text pages welcome  ?

## 2021-09-01 NOTE — Progress Notes (Addendum)
?  Progress Note ? ? ? ?09/01/2021 ?7:53 AM ?6 Days Post-Op ? ?Subjective:  sleeping; wakes easily ? ?afebrile ? ?Vitals:  ? 09/01/21 0026 09/01/21 0655  ?BP: 125/70 128/66  ?Pulse: (!) 50 74  ?Resp: (!) 23 (!) 21  ?Temp: 98.1 ?F (36.7 ?C) (P) 98 ?F (36.7 ?C)  ?SpO2: 96% 99%  ? ? ?Physical Exam: ?General:  no distress ?Lungs:  non labored ?Incisions:   ? ? ?Extremities:  right foot is warm and well perfused.  ? ?CBC ?   ?Component Value Date/Time  ? WBC 5.8 08/29/2021 0334  ? RBC 2.74 (L) 08/29/2021 0334  ? HGB 8.2 (L) 08/30/2021 0140  ? HGB 8.4 (L) 05/19/2021 1200  ? HCT 25.4 (L) 08/30/2021 0140  ? HCT 28.2 (L) 05/19/2021 1200  ? PLT 152 08/29/2021 0334  ? PLT 255 05/19/2021 1200  ? MCV 92.3 08/29/2021 0334  ? MCV 84 05/19/2021 1200  ? MCH 29.2 08/29/2021 0334  ? MCHC 31.6 08/29/2021 0334  ? RDW 15.6 (H) 08/29/2021 0334  ? RDW 16.2 (H) 05/19/2021 1200  ? LYMPHSABS 0.8 08/19/2021 0545  ? MONOABS 0.8 08/19/2021 0545  ? EOSABS 0.3 08/19/2021 0545  ? BASOSABS 0.1 08/19/2021 0545  ? ? ?BMET ?   ?Component Value Date/Time  ? NA 135 08/29/2021 0334  ? NA 141 05/19/2021 1200  ? K 3.8 08/29/2021 0334  ? CL 98 08/29/2021 0334  ? CO2 31 08/29/2021 0334  ? GLUCOSE 171 (H) 08/29/2021 0334  ? BUN 20 08/29/2021 0334  ? BUN 49 (H) 05/19/2021 1200  ? CREATININE 1.30 (H) 08/29/2021 0334  ? CREATININE 1.19 10/09/2015 1554  ? CALCIUM 8.0 (L) 08/29/2021 0334  ? GFRNONAA 59 (L) 08/29/2021 0334  ? GFRNONAA 65 10/09/2015 1554  ? GFRAA 52 (L) 05/01/2020 1632  ? GFRAA 75 10/09/2015 1554  ? ? ?INR ?   ?Component Value Date/Time  ? INR 1.1 09/01/2021 0348  ? ? ? ?Intake/Output Summary (Last 24 hours) at 09/01/2021 0753 ?Last data filed at 09/01/2021 0028 ?Gross per 24 hour  ?Intake 480 ml  ?Output 1650 ml  ?Net -1170 ml  ? ? ? ?Assessment/Plan:  71 y.o. male is s/p:  ?Right 2nd toe amputation  ?6 Days Post-Op ? ? ?-amputation site healing well with sutures in tact ?-he has f/u appt on 09/17/2021 at 1420.   ?-continue daily dressing changes and heel  weight bearing only. ?-will continue to check in periodically while still in the hospital. ? ? ? ?Leontine Locket, PA-C ?[facility] ?[phone] ?09/01/2021 ?7:53 AM ? ? ?I have independently interviewed and examined patient and agree with PA assessment and plan above.  ? ?Amarri Satterly C. Donzetta Matters, MD ?[facility] ?Office: [phone] ?Pager: [phone] ? ?

## 2021-09-01 NOTE — Progress Notes (Signed)
ANTICOAGULATION CONSULT NOTE ? ?Pharmacy Consult for enoxaparin, warfarin ?Indication: atrial fibrillation and hx of DVT ? ?No Known Allergies ? ?Patient Measurements: ?Height: 6' (182.9 cm) ?Weight: 92.9 kg (204 lb 12.9 oz) ?IBW/kg (Calculated) : 77.6 ? ? ?Vital Signs: ?Temp: 97.8 ?F (36.6 ?C) (05/08 1048) ?Temp Source: Oral (05/08 1048) ?BP: 131/72 (05/08 1048) ?Pulse Rate: 78 (05/08 1048) ? ?Labs: ?Recent Labs  ?  08/30/21 ?0140 08/31/21 ?0238 09/01/21 ?5784  ?HGB 8.2*  --   --   ?HCT 25.4*  --   --   ?LABPROT 15.9* 13.9 14.4  ?INR 1.3* 1.1 1.1  ? ? ?Estimated Creatinine Clearance: 58.9 mL/min (A) (by C-G formula based on SCr of 1.3 mg/dL (H)). ? ? ?Assessment: ?71 YO male with medical history significant for atrial fibrillation on PTA warfarin 2.'5mg'$  daily who is admitted with dyspnea due to COPD exacerbation. Patient now s/p R/LHC (5/01) and 2nd toe amputation (5/02). Pt has been on IV heparin while warfarin was held. Heparin was discontinued and warfarin restarted 5/05. Pharmacy consulted for warfarin dosing with enoxaparin bridge.  ? ?Will bridge with enoxaparin until INR is therapeutic. PTA warfarin dose was 2.'5mg'$  PO daily. INR remains subtherapeutic and unchanged from 1.1. Will give supplemental dose today, anticipate patient being able to resume PTA warfarin regimen once discharged to SNF. ? ? ?Goal of Therapy:  ?INR 2-2.5 (decreased due to normocytic anemia in past) ?Monitor platelets by anticoagulation protocol: Yes ?  ?Plan:  ?Continue enoxaparin '100mg'$  Q12h ?Give warfarin 5 mg PO x1 dose ?Check INR daily while on warfarin ?Continue to monitor H&H and platelets ? ? ? ?Thank you for allowing pharmacy to be a part of this patient?s care. ? ?Ardyth Harps, PharmD ?Clinical Pharmacist ? ? ? ? ?

## 2021-09-01 NOTE — Progress Notes (Signed)
FPTS Brief Progress Note ? ?S:Patient asleep in bed ? ? ?O: ?BP 125/70 (BP Location: Left Arm)   Pulse (!) 50   Temp 98.1 ?F (36.7 ?C) (Oral)   Resp (!) 23   Ht 6' (1.829 m)   Wt 92.9 kg   SpO2 96%   BMI 27.78 kg/m?   ? ?Gen: NAD, laying in bed comfortably ?Resp: saturating well on 3 L, no dyspnea noted ? ?A/P: ?Plan for SNF pending insurance authorization ?- Orders reviewed. Labs for AM not ordered, which was adjusted as needed.  ? ?Gerrit Heck, MD ?09/01/2021, 4:46 AM ?PGY-1, [place] Medicine Night Resident  ?Please page [phone] with questions.  ?  ?

## 2021-09-01 NOTE — Progress Notes (Signed)
Physical Therapy Treatment ?Patient Details ?Name: Rodney [name] ?MRN: [id number] ?DOB: March 25, 1951 ?Today's Date: 09/01/2021 ? ? ?History of Present Illness Patient is a 71 yo male presenting to the ED with SOB on 08/19/21. Admitted same day with acute respiratory failure and COPD exacerbation. Patient with 4 admissions within the last 6 months. 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  PMH is significant for DM 2, obesity, tobacco abuse, hypertension, protein calorie malnutrition, aortic stenosis, anemia chronic disease, PVD, HFrEF, PAF, status post BKA, CAD, CKD stage III, COPD, COVID+ ? ?  ?PT Comments  ? ? The pt was agreeable to bed-level exercises only despite arrival of darco shoe to allow for standing and wt bearing through RLE. The pt was able to complete a series of LE exercises as described below to improve AROM and strength in hips, quad, and calf. The pt continues to present with poor muscular endurance, activity tolerance, and strength. Continue to recommend SNF rehab at d/c due to significant assist the pt will likely need to complete transfers and OOB mobility.  ?   ?Recommendations for follow up therapy are one component of a multi-disciplinary discharge planning process, led by the attending physician.  Recommendations may be updated based on patient status, additional functional criteria and insurance authorization. ? ?Follow Up Recommendations ? Skilled nursing-short term rehab (<3 hours/day) ?  ?  ?Assistance Recommended at Discharge Frequent or constant Supervision/Assistance  ?Patient can return home with the following A lot of help with walking and/or transfers;A little help with bathing/dressing/bathroom;Assistance with cooking/housework;Assistance with feeding;Direct supervision/assist for medications management;Direct supervision/assist for financial management;Assist for transportation;Help with stairs or ramp for entrance ?  ?Equipment Recommendations ? None recommended by PT  ?   ?Recommendations for Other Services   ? ? ?  ?Precautions / Restrictions Precautions ?Precautions: Fall ?Precaution Comments: L BKA ?Required Braces or Orthoses: Other Brace ?Other Brace: R darco shoe (in room) ?Restrictions ?Weight Bearing Restrictions: Yes ?RLE Weight Bearing: Partial weight bearing ?Other Position/Activity Restrictions: heel weightbearing in darco shoe on the R  ?  ? ?Mobility ? Bed Mobility ?Overal bed mobility: Needs Assistance ?Bed Mobility: Rolling ?Rolling: Modified independent (Device/Increase time) ?  ?  ?  ?  ?General bed mobility comments: using bed rails ?  ? ?Transfers ?  ?  ?  ?  ?  ?  ?  ?  ?  ?General transfer comment: pt refused OOB mobility ?  ? ?Ambulation/Gait ?  ?  ?  ?  ?  ?  ?  ?General Gait Details: pt refused OOB mobility ? ? ?  ?   ?Cognition Arousal/Alertness: Awake/alert ?Behavior During Therapy: Agitated ?Overall Cognitive Status: No family/caregiver present to determine baseline cognitive functioning ?  ?  ?  ?  ?  ?  ?  ?  ?  ?  ?  ?  ?  ?  ?  ?  ?General Comments: pt able to answer questions, more irritable today and self-limiting ?  ?  ? ?  ?Exercises General Exercises - Lower Extremity ?Ankle Circles/Pumps: Strengthening, Right, 20 reps, Supine (2 x 10 against min resistance) ?Heel Slides: Strengthening, Right, 20 reps, Supine (2 x 10 against min resistance at knee for hip flexion and at heel for leg extension) ?Hip ABduction/ADduction: Strengthening, Right, 15 reps, Sidelying (2 x 8) ?Straight Leg Raises: AROM, Right, 20 reps, Supine (2 x 10) ?Other Exercises ?Other Exercises: pt refusing seated scooting or further exercises after above due to fatigue/irritability ? ?  ?  General Comments General comments (skin integrity, edema, etc.): VSS on 2L O2 ?  ?  ? ?Pertinent Vitals/Pain Pain Assessment ?Pain Assessment: No/denies pain ?Pain Intervention(s): Monitored during session  ? ? ? ?PT Goals (current goals can now be found in the care plan section) Acute Rehab PT  Goals ?Patient Stated Goal: to go home ?PT Goal Formulation: With patient ?Time For Goal Achievement: 09/10/21 ?Potential to Achieve Goals: Fair ?Progress towards PT goals: Not progressing toward goals - comment (self-limiting mobility) ? ?  ?Frequency ? ? ? Min 3X/week ? ? ? ?  ?PT Plan Current plan remains appropriate  ? ? ?   ?AM-PAC PT "6 Clicks" Mobility   ?Outcome Measure ? Help needed turning from your back to your side while in a flat bed without using bedrails?: None ?Help needed moving from lying on your back to sitting on the side of a flat bed without using bedrails?: A Little ?Help needed moving to and from a bed to a chair (including a wheelchair)?: A Little ?Help needed standing up from a chair using your arms (e.g., wheelchair or bedside chair)?: Total ?Help needed to walk in hospital room?: Total ?Help needed climbing 3-5 steps with a railing? : Total ?6 Click Score: 13 ? ?  ?End of Session Equipment Utilized During Treatment: Oxygen ?Activity Tolerance: Patient limited by fatigue ?Patient left: in bed;with call bell/phone within reach;with bed alarm set ?Nurse Communication: Mobility status ?PT Visit Diagnosis: Muscle weakness (generalized) (M62.81);Other abnormalities of gait and mobility (R26.89) ?  ? ? ?Time: 4656-8127 ?PT Time Calculation (min) (ACUTE ONLY): 20 min ? ?Charges:  $Therapeutic Exercise: 8-22 mins          ?          ? ?[place] Carbo, PT, DPT  ? ?Acute Rehabilitation Department ?Pager #: [phone] ? ? ?Sandra Cockayne ?09/01/2021, 5:14 PM ? ?

## 2021-09-02 DIAGNOSIS — J9622 Acute and chronic respiratory failure with hypercapnia: Secondary | ICD-10-CM | POA: Diagnosis not present

## 2021-09-02 DIAGNOSIS — J9621 Acute and chronic respiratory failure with hypoxia: Secondary | ICD-10-CM | POA: Diagnosis not present

## 2021-09-02 LAB — GLUCOSE, CAPILLARY
Glucose-Capillary: 116 mg/dL — ABNORMAL HIGH (ref 70–99)
Glucose-Capillary: 115 mg/dL — ABNORMAL HIGH (ref 70–99)
Glucose-Capillary: 109 mg/dL — ABNORMAL HIGH (ref 70–99)
Glucose-Capillary: 186 mg/dL — ABNORMAL HIGH (ref 70–99)

## 2021-09-02 LAB — CBC
HCT: 26.3 % — ABNORMAL LOW (ref 39.0–52.0)
Hemoglobin: 8.5 g/dL — ABNORMAL LOW (ref 13.0–17.0)
MCH: 29.9 pg (ref 26.0–34.0)
MCHC: 32.3 g/dL (ref 30.0–36.0)
MCV: 92.6 fL (ref 80.0–100.0)
Platelets: 168 10*3/uL (ref 150–400)
RBC: 2.84 MIL/uL — ABNORMAL LOW (ref 4.22–5.81)
RDW: 15.2 % (ref 11.5–15.5)
WBC: 6.9 10*3/uL (ref 4.0–10.5)
nRBC: 0 % (ref 0.0–0.2)

## 2021-09-02 LAB — PROTIME-INR
INR: 1.2 (ref 0.8–1.2)
Prothrombin Time: 14.7 seconds (ref 11.4–15.2)

## 2021-09-02 MED ORDER — ENOXAPARIN SODIUM 100 MG/ML IJ SOSY
90.0000 mg | PREFILLED_SYRINGE | Freq: Two times a day (BID) | INTRAMUSCULAR | Status: DC
  Administered 2021-09-02 – 2021-09-03 (×2): 90 mg via SUBCUTANEOUS
  Filled 2021-09-02 (×3): qty 1

## 2021-09-02 MED ORDER — WARFARIN SODIUM 5 MG PO TABS
5.0000 mg | ORAL_TABLET | Freq: Once | ORAL | Status: AC
  Administered 2021-09-02: 5 mg via ORAL
  Filled 2021-09-02: qty 1

## 2021-09-02 NOTE — Progress Notes (Signed)
As per CCMD patient had 11 beats of Vtach upon assessment pt is watching TV denies any pain or discomfort. ?

## 2021-09-02 NOTE — Progress Notes (Signed)
Occupational Therapy Treatment ?Patient Details ?Name: Rodney [name] ?MRN: [id number] ?DOB: 1950/11/25 ?Today's Date: 09/02/2021 ? ? ?History of present illness Patient is a 71 yo male presenting to the ED with SOB on 08/19/21. Admitted same day with acute respiratory failure and COPD exacerbation. Patient with 4 admissions within the last 6 months. 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  PMH is significant for DM 2, obesity, tobacco abuse, hypertension, protein calorie malnutrition, aortic stenosis, anemia chronic disease, PVD, HFrEF, PAF, status post BKA, CAD, CKD stage III, COPD, COVID+ ?  ?OT comments ? Pt declined OOB activity today again. Reports that he has only used theraband 1 time so far (previous session with OT) Pt able to perform bed level HEP (see below) and demonstrate rolling at bed level at mod I. Encouraged Pt to perform HEP 3x a day (breakfast, lunch, dinner) Pt provided with encouragement, education, darco shoe present and continued to decline OOB. Also recommending Palliative Medicine consult to discuss [place], due to multiple hospitalizations, multiple dx. Next time would like to co-tx with PT to encourage Pt for OOB. OT will continue to follow acutely. SNF remains essential at this time post-acute.  ? ?Recommendations for follow up therapy are one component of a multi-disciplinary discharge planning process, led by the attending physician.  Recommendations may be updated based on patient status, additional functional criteria and insurance authorization. ?   ?Follow Up Recommendations ? Skilled nursing-short term rehab (<3 hours/day)  ?  ?Assistance Recommended at Discharge Frequent or constant Supervision/Assistance  ?Patient can return home with the following ? A little help with walking and/or transfers;A little help with bathing/dressing/bathroom;Assistance with cooking/housework;Assist for transportation;Help with stairs or ramp for entrance;Direct supervision/assist for medications  management ?  ?Equipment Recommendations ? None recommended by OT (Pt has appropriate DME)  ?  ?Recommendations for Other Services Other (comment) (Palliative Medicine Consult) ? ?  ?Precautions / Restrictions Precautions ?Precautions: Fall ?Precaution Comments: L BKA ?Required Braces or Orthoses: Other Brace ?Other Brace: R darco shoe (in room) ?Restrictions ?Weight Bearing Restrictions: Yes ?RLE Weight Bearing: Partial weight bearing ?Other Position/Activity Restrictions: heel weightbearing in darco shoe on the R  ? ? ?  ? ?Mobility Bed Mobility ?Overal bed mobility: Modified Independent ?Bed Mobility: Rolling ?  ?  ?  ?  ?  ?General bed mobility comments: using bed rails ?  ? ?Transfers ?  ?  ?  ?  ?  ?  ?  ?  ?  ?General transfer comment: pt refused OOB mobility ?  ?  ?Balance   ?  ?  ?  ?  ?  ?  ?Standing balance comment: pt declined "I just want to nap" ?  ?  ?  ?  ?  ?  ?  ?  ?  ?  ?  ?   ? ?ADL either performed or assessed with clinical judgement  ? ?ADL Overall ADL's : Needs assistance/impaired ?  ?  ?Grooming: Bed level;Wash/dry hands;Set up ?Grooming Details (indicate cue type and reason): washing face/hands with wash cloth ?  ?  ?  ?  ?  ?  ?  ?  ?  ?  ?  ?  ?  ?  ?  ?General ADL Comments: self-limiting, declined OOB ?  ? ?Extremity/Trunk Assessment Upper Extremity Assessment ?Upper Extremity Assessment: Generalized weakness ?  ?  ?  ?  ?  ? ?Vision   ?  ?  ?Perception   ?  ?Praxis   ?  ? ?  Cognition Arousal/Alertness: Awake/alert ?Behavior During Therapy: Flat affect ?Overall Cognitive Status: No family/caregiver present to determine baseline cognitive functioning ?  ?  ?  ?  ?  ?  ?  ?  ?  ?  ?  ?  ?  ?  ?  ?  ?General Comments: Pt adamently refusing OOB, not irritable , but adament about bed level only. attempted distraction, education, for mobility ?  ?  ?   ?Exercises Exercises: General Upper Extremity ?General Exercises - Upper Extremity ?Shoulder Flexion: AROM, Both, 10 reps, Theraband ?Theraband  Level (Shoulder Flexion): Level 2 (Red) ?Shoulder ABduction: AROM, Both, 10 reps, Theraband, Supine ?Theraband Level (Shoulder Abduction): Level 2 (Red) ?Shoulder Horizontal ABduction: AROM, Both, 10 reps, Seated, Theraband ?Theraband Level (Shoulder Horizontal Abduction): Level 2 (Red) ?Elbow Extension: AROM, Both, 10 reps, Theraband, Supine ?Theraband Level (Elbow Extension): Level 2 (Red) ? ?  ?Shoulder Instructions   ? ? ?  ?General Comments    ? ? ?Pertinent Vitals/ Pain       Pain Assessment ?Pain Assessment: No/denies pain ?Pain Intervention(s): Monitored during session ? ?Home Living   ?  ?  ?  ?  ?  ?  ?  ?  ?  ?  ?  ?  ?  ?  ?  ?  ?  ?  ? ?  ?Prior Functioning/Environment    ?  ?  ?  ?   ? ?Frequency ? Min 2X/week  ? ? ? ? ?  ?Progress Toward Goals ? ?OT Goals(current goals can now be found in the care plan section) ? Progress towards OT goals: Not progressing toward goals - comment (limited self to bed level only) ? ?Acute Rehab OT Goals ?Patient Stated Goal: to get a nap ?OT Goal Formulation: With patient ?Time For Goal Achievement: 09/16/21 ?Potential to Achieve Goals: Fair  ?Plan Discharge plan remains appropriate   ? ?Co-evaluation ? ? ?   ?  ?  ?  ?  ? ?  ?AM-PAC OT "6 Clicks" Daily Activity     ?Outcome Measure ? ? Help from another person eating meals?: A Little ?Help from another person taking care of personal grooming?: A Little ?Help from another person toileting, which includes using toliet, bedpan, or urinal?: A Lot ?Help from another person bathing (including washing, rinsing, drying)?: A Lot ?Help from another person to put on and taking off regular upper body clothing?: A Little ?Help from another person to put on and taking off regular lower body clothing?: A Lot ?6 Click Score: 15 ? ?  ?End of Session Equipment Utilized During Treatment: Oxygen (2L) ? ?OT Visit Diagnosis: Muscle weakness (generalized) (M62.81) ?  ?Activity Tolerance Patient limited by fatigue ("i just want to sleep") ?   ?Patient Left in bed;with call bell/phone within reach ?  ?Nurse Communication Mobility status ?  ? ?   ? ?Time: 7680-8811 ?OT Time Calculation (min): 12 min ? ?Charges: OT General Charges ?$OT Visit: 1 Visit ?OT Treatments ?$Therapeutic Exercise: 8-22 mins ?Jesse Sans OTR/L ?Acute Rehabilitation Services ?Pager: [phone] ?Office: [phone] ?Merri Ray Deondrea Markos ?09/02/2021, 1:34 PM ?

## 2021-09-02 NOTE — Progress Notes (Signed)
FPTS Brief Note ?Reviewed patient's vitals, recent notes.  ?Vitals:  ? 09/01/21 2157 09/02/21 0139  ?BP: 114/67 116/63  ?Pulse: 77 76  ?Resp: 20 20  ?Temp:  98.1 ?F (36.7 ?C)  ?SpO2: 99% 97%  ? ?At this time, no change in plan from day progress note.  ?Gerrit Heck, MD ?Page [phone] with questions about this patient.  ?  ?

## 2021-09-02 NOTE — Progress Notes (Signed)
ANTICOAGULATION CONSULT NOTE ? ?Pharmacy Consult for enoxaparin, warfarin ?Indication: atrial fibrillation and hx of DVT ? ?No Known Allergies ? ?Patient Measurements: ?Height: 6' (182.9 cm) ?Weight: 87.6 kg (193 lb 2 oz) ?IBW/kg (Calculated) : 77.6 ? ? ?Vital Signs: ?Temp: 98.3 ?F (36.8 ?C) (05/09 0740) ?Temp Source: Oral (05/09 0740) ?BP: 128/66 (05/09 0740) ?Pulse Rate: 77 (05/09 0740) ? ?Labs: ?Recent Labs  ?  08/31/21 ?0238 09/01/21 ?5038 09/02/21 ?0403  ?LABPROT 13.9 14.4 14.7  ?INR 1.1 1.1 1.2  ? ? ?Estimated Creatinine Clearance: 58.9 mL/min (A) (by C-G formula based on SCr of 1.3 mg/dL (H)). ? ? ?Assessment: ?71 YO male with medical history significant for atrial fibrillation on PTA warfarin 2.'5mg'$  daily who is admitted with dyspnea due to COPD exacerbation. Patient now s/p R/LHC (5/01) and 2nd toe amputation (5/02). Pt has been on IV heparin while warfarin was held. Heparin was discontinued and warfarin restarted 5/05. Pharmacy consulted for warfarin dosing with enoxaparin bridge.  ? ?Will bridge with therapeutic enoxaparin until INR is within goal range. PTA warfarin dose was 2.'5mg'$  PO daily.  ? ?Previous CBC from 5/05 stable. Repeat CBC remains stable, hgb 8.5, platelets 168. INR remains subtherapeutic and relatively unchanged at 1.2. continue at higher dose today, anticipate patient being able to resume PTA warfarin regimen once discharged to SNF. ? ? ?Goal of Therapy:  ?INR 2-2.5 (decreased due to normocytic anemia in past) ?Monitor platelets by anticoagulation protocol: Yes ?  ?Plan:  ?Enoxaparin '90mg'$  Q12h ?Give warfarin 5 mg PO x1 dose ?Check INR daily while on warfarin ?Continue to monitor H&H and platelets ? ? ? ?Thank you for allowing pharmacy to be a part of this patient?s care. ? ?Ardyth Harps, PharmD ?Clinical Pharmacist ? ? ? ? ?

## 2021-09-02 NOTE — Progress Notes (Signed)
Family Medicine Teaching Service ?Daily Progress Note ?Intern Pager: [phone] ? ?Patient name: Rodney [name] Medical record number: [id number] ?Date of birth: 27-Mar-1951 Age: 71 y.o. Gender: male ? ?Primary Care Provider: [name], MD ?Consultants: Cardiology, vascular surgery ?Code Status: Full code ? ?Pt Overview and Major Events to Date:  ?4/25-admitted, placed on BiPAP briefly then weaned to nasal cannula, started cefepime and given IV methylprednisolone ?4/26-transitioned steroids to oral prednisone, transfused 1u PRBC ?4/27-cefepime transitioned to cefdinir ?4/29-completed course of cefdinir and prednisone ?5/1-B/l heart cath  ?5/2-Right toe amputation ?5/5-restarted warfarin with heparin bridge ?5/6-d/c to snf delayed to due insurance approval ?5/7-pending insurance approval for SNF ?  ?Assessment and Plan: ?Rodney [name] is a 72-year initially admitted for COPD exacerbation (now resolved).  Now experiencing C. difficile recurrence and undergoing preop preparation for TAVR, which includes L+RHC and amputation of gangrenous toe. PMHx significant for: COPD, HFrEF 2/2 severe AS, CAD, PAD s/p left BKA, PAF on warfarin, T2DM, CKD stage III, anemia of chronic disease, protein calorie malnutrition. ?  ?Patient is medically stable for discharge to SNF ? ?C. difficile recurrence, resolved ?Last day of Pradaxa mycin 200 mg BID x10d, end date 09/02/21 ?Had bowel movement last night.  No diarrhea.  We will discontinue enteric precautions now. ?Completed Pradaxa ?DC enteric precautions ?  ?HFpEF 2/2 Severe AS & CAD s/p L&R Heart Cath (5/1), stable ?Euvolemic, baseline O2 requirement 2 L nasal cannula.  Adequate urine output. At dry weight 93 kg ?Continue Lasix, losartan, Coreg. ? ?AF, rate controlled and AC, stable ?HR within normal limits.   ?Continued home Coreg and warfarin per pharmacy with Lovenox bridge ?Already discussed that with SNF, once insurance has not authorized, SNF is very comfortable with  continuing warfarin titration with Lovenox bridge ? 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 ?Coreg, plavix ?Dressing changes per surgery ?Heel weight bearing only with darco shoe, per surg ?f/u in 3-4 weeks for wound check/suture removal ? ?Poor dentition ?Required teeth extraction for TAVR prep. Patient will have to follow up with oral surgery outpatient. ?  ?Anemia of CKD and iron deficiency s/p IV iron, stable ?Stable, 8.2 on 5/6 ?  ?Acute on chronic hypoxemic respiratory failure secondary to COPD exacerbation; resolved ?At baseline ?Duo nebs BID PRN ?Continued Breo Ellipta daily ?O2 goal: 88-92% ?  ?CKD III, improving and stable ?Stable, baseline Cr 1.30 ?  ?T2DM, controlled ?A1c 5.9 (08/20/2021).  ?Continue Levemir 15 units daily ?CBGs ? ?Mediastinal lymph nodes ?Outpatient PET follow-up ?  ?GERD ?Continue protonix 40 mg ?  ?Insomnia ?Continue nightly trazodone scheduled ?  ?De-Conditioning ?OOB order ?Encourage mobilization ?  ? ? ?FEN/GI: Heart healthy & carb mod ?PPx: Protonix ?VTE: Scd's start: 08/25/21 1641, warfarin per pharm and lovenox bridge ? ? ?Dispo: Skilled nursing-short term rehab (<3 hours/day)  ?DME: None recommended by PT (08/29/21) ?Barrier: Ship broker.  ? ?Subjective:  ?No family at bedside. ? ?No acute events overnight. ?He reported having a satisfied bowel movement last night.  Otherwise denied pain, or any other acute concerns. ? ?Objective: ?Temp:  [97.8 ?F (36.6 ?C)-98.1 ?F (36.7 ?C)] 98 ?F (36.7 ?C) (05/09 0404) ?Pulse Rate:  [72-78] 77 (05/09 0404) ?Cardiac Rhythm: Atrial flutter (05/08 1900) ?Resp:  [18-23] 19 (05/09 0404) ?BP: (114-131)/(63-77) 126/72 (05/09 0404) ?SpO2:  [96 %-99 %] 96 % (05/09 0404) ?Weight:  [87.6 kg] 87.6 kg (05/08 2300)  ?Physical Exam ?Vitals and  nursing note reviewed.  ?Constitutional:   ?   General: He is awake. He is not in acute distress. ?    Appearance: He is not ill-appearing, toxic-appearing or diaphoretic.  ?HENT:  ?   Head: Normocephalic and atraumatic.  ?Cardiovascular:  ?   Rate and Rhythm: Normal rate and regular rhythm.  ?   Heart sounds: Murmur heard.  ?  No friction rub. No gallop.  ?Pulmonary:  ?   Effort: Pulmonary effort is normal. No respiratory distress.  ?   Breath sounds: Decreased breath sounds and wheezing (very mild end expiratory wheeze) present. No rales.  ?Abdominal:  ?   General: Bowel sounds are normal.  ?   Palpations: Abdomen is soft.  ?   Tenderness: There is no abdominal tenderness. There is no guarding or rebound.  ?Musculoskeletal:  ?   Right lower leg: No tenderness.  ?   Left lower leg: No tenderness.  ?Skin: ?   General: Skin is warm and dry.  ?Neurological:  ?   General: No focal deficit present.  ?   Mental Status: He is alert and oriented to person, place, and time.  ?Psychiatric:     ?   Behavior: Behavior is cooperative.  ?  ? ?Laboratory: ?Recent Labs  ?Lab 08/27/21 ?0127 08/28/21 ?0242 08/29/21 ?2778 08/30/21 ?0140  ?WBC 7.6 6.5 5.8  --   ?HGB 8.2* 8.2* 8.0* 8.2*  ?HCT 25.9* 25.6* 25.3* 25.4*  ?PLT 157 147* 152  --   ? ? ?Recent Labs  ?Lab 08/27/21 ?0127 08/29/21 ?2423  ?NA 135 135  ?K 4.0 3.8  ?CL 99 98  ?CO2 31 31  ?BUN 25* 20  ?CREATININE 1.25* 1.30*  ?CALCIUM 7.9* 8.0*  ?GLUCOSE 246* 171*  ? ? ? ?Merrily Brittle, DO ?09/02/2021, 6:53 AM ?PGY-1, [place] ?[place] Intern pager: [phone], text pages welcome  ?

## 2021-09-02 NOTE — TOC Progression Note (Signed)
Transition of Care (TOC) - Progression Note  ? ? ?Patient Details  ?Name: Rodney [name] ?MRN: [id number] ?Date of Birth: 1951/04/09 ? ?Transition of Care (TOC) CM/SW Contact  ?Charonda Hefter Renold Don, LCSWA ?Phone Number: ?09/02/2021, 2:44 PM ? ?Clinical Narrative:    ?Pt auth still pending. ? ? ?Expected Discharge Plan: [place] ?Barriers to Discharge: Continued Medical Work up, Ship broker ? ?Expected Discharge Plan and Services ?Expected Discharge Plan: [place] ?  ?Discharge Planning Services: CM Consult ?Post Acute Care Choice: [place] ?Living arrangements for the past 2 months: [place] ?Expected Discharge Date: 08/31/21               ?  ?DME Agency: NA ?  ?  ?  ?HH Arranged: RN, PT, OT ?[place] Agency: [place] ?Date HH Agency Contacted: 08/20/21 ?Time [place]: [id number] ?Representative spoke with at [place]: Tommi Rumps ? ? ?Social Determinants of Health (SDOH) Interventions ?  ? ?Readmission Risk Interventions ? ?  08/21/2021  ?  5:10 PM 06/26/2021  ?  4:36 PM 05/16/2021  ?  3:13 PM  ?Readmission Risk Prevention Plan  ?Transportation Screening Complete Complete Complete  ?Medication Review Press photographer) Complete Complete Complete  ?PCP or Specialist appointment within 3-5 days of discharge Complete Complete Complete  ?[place] or Home Care Consult Complete Complete Complete  ?SW Recovery Care/Counseling Consult Complete Complete Complete  ?Palliative Care Screening Not Applicable Not Applicable Not Applicable  ?[place] Not Applicable Complete Not Applicable  ? ? ?

## 2021-09-03 DIAGNOSIS — S98111A Complete traumatic amputation of right great toe, initial encounter: Secondary | ICD-10-CM

## 2021-09-03 LAB — GLUCOSE, CAPILLARY
Glucose-Capillary: 92 mg/dL (ref 70–99)
Glucose-Capillary: 115 mg/dL — ABNORMAL HIGH (ref 70–99)
Glucose-Capillary: 161 mg/dL — ABNORMAL HIGH (ref 70–99)

## 2021-09-03 LAB — PROTIME-INR
INR: 1.2 (ref 0.8–1.2)
Prothrombin Time: 15 seconds (ref 11.4–15.2)

## 2021-09-03 MED ORDER — WARFARIN SODIUM 7.5 MG PO TABS
7.5000 mg | ORAL_TABLET | Freq: Once | ORAL | Status: AC
  Administered 2021-09-03: 7.5 mg via ORAL
  Filled 2021-09-03: qty 1

## 2021-09-03 NOTE — Progress Notes (Signed)
Patient was O2 Sat on 2 Liters was 94 percent. Pt states he wears 3 L at home though. Pt refused to ambulate with PT today. Vital signs taken at 1497 are 100 percent on 2 Liters via nasal cannula , TPR 98 F oral, 73, 18 and BP 105/63 (77). Denies pain. ?

## 2021-09-03 NOTE — TOC Progression Note (Addendum)
Transition of Care (TOC) - Progression Note  ? ? ?Patient Details  ?Name: Rodney [name] ?MRN: [id number] ?Date of Birth: 08-Mar-1951 ? ?Transition of Care (TOC) CM/SW Contact  ?Horst Ostermiller Renold Don, LCSWA ?Phone Number: ?09/03/2021, 12:02 PM ? ?Clinical Narrative:    ?Pt Rodney [name] was denied, CSW contacted pt spouse who does not want to appeal. Pt and spouse are agreeable to [facility]. Pt is active with [facility] an would like to see if they could get a bedside commode. CSW followed up with Zack with [employer] to inquire about DME. Pt will need PTAR. ? ? ?Expected Discharge Plan: [place] ?Barriers to Discharge: Continued Medical Work up, Ship broker ? ?Expected Discharge Plan and Services ?Expected Discharge Plan: [place] ?  ?Discharge Planning Services: CM Consult ?Post Acute Care Choice: [place] ?Living arrangements for the past 2 months: [place] ?Expected Discharge Date: 08/31/21               ?  ?DME Agency: NA ?  ?  ?  ?HH Arranged: RN, PT, OT ?[place] Agency: [place] ?Date HH Agency Contacted: 08/20/21 ?Time [place]: [id number] ?Representative spoke with at [place]: Tommi Rumps ? ? ?Social Determinants of Health (SDOH) Interventions ?  ? ?Readmission Risk Interventions ? ?  08/21/2021  ?  5:10 PM 06/26/2021  ?  4:36 PM 05/16/2021  ?  3:13 PM  ?Readmission Risk Prevention Plan  ?Transportation Screening Complete Complete Complete  ?Medication Review Press photographer) Complete Complete Complete  ?PCP or Specialist appointment within 3-5 days of discharge Complete Complete Complete  ?[place] or Home Care Consult Complete Complete Complete  ?SW Recovery Care/Counseling Consult Complete Complete Complete  ?Palliative Care Screening Not Applicable Not Applicable Not Applicable  ?[place] Not Applicable Complete Not Applicable  ? ? ?

## 2021-09-03 NOTE — Progress Notes (Signed)
Pt alert and oriented; VSS. Oxygen saturation 100% on 3L via nasal cannula. Pt discharged home via EMT. Pt aware and verbalized understanding discharge instructions.  ?

## 2021-09-03 NOTE — Progress Notes (Signed)
Occupational Therapy Treatment ?Patient Details ?Name: Rodney [name] ?MRN: [id number] ?DOB: April 09, 1951 ?Today's Date: 09/03/2021 ? ? ?History of present illness Patient is a 71 yo male presenting to the ED with SOB on 08/19/21. Admitted same day with acute respiratory failure and COPD exacerbation. Patient with 4 admissions within the last 6 months. 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  PMH is significant for DM 2, obesity, tobacco abuse, hypertension, protein calorie malnutrition, aortic stenosis, anemia chronic disease, PVD, HFrEF, PAF, status post BKA, CAD, CKD stage III, COPD, COVID+ ?  ?OT comments ? Patient continues to make incremental progress towards goals in skilled OT session. Patient's session encompassed practice completing scoot pivot transfer from bed and back. Patient requiring max encouragement to participate, and patient agreeable after he ate his lunch. BP lower sitting EOB due to remaining supine for a number of days (assess at 82/58 (67)) with dizziness subsiding with sitting up eating lunch. Patient min of 2 to complete scoot pivot transfers, but would likely be a mod-max of 1 to complete (patient states his girlfriend can help). OT continues to endorse SNF placement due to current level. OT will continue to follow acutely.     ? ?Recommendations for follow up therapy are one component of a multi-disciplinary discharge planning process, led by the attending physician.  Recommendations may be updated based on patient status, additional functional criteria and insurance authorization. ?   ?Follow Up Recommendations ? Skilled nursing-short term rehab (<3 hours/day)  ?  ?Assistance Recommended at Discharge Frequent or constant Supervision/Assistance  ?Patient can return home with the following ? Assistance with cooking/housework;Assist for transportation;Help with stairs or ramp for entrance;Direct supervision/assist for medications management;A lot of help with walking and/or transfers;A lot of  help with bathing/dressing/bathroom ?  ?Equipment Recommendations ? None recommended by OT (Has appropriate DME)  ?  ?Recommendations for Other Services   ? ?  ?Precautions / Restrictions Precautions ?Precautions: Fall ?Precaution Comments: L BKA ?Required Braces or Orthoses: Other Brace ?Other Brace: R darco shoe (in room) ?Restrictions ?Weight Bearing Restrictions: Yes ?RLE Weight Bearing: Partial weight bearing ?Other Position/Activity Restrictions: heel weightbearing in darco shoe on the R  ? ? ?  ? ?Mobility Bed Mobility ?Overal bed mobility: Modified Independent ?  ?  ?Sidelying to sit: Mod assist ?  ?  ?  ?General bed mobility comments: pulling up on PT with R arm, PT providing assist at trunk to come fully upright ?  ? ?Transfers ?Overall transfer level: Needs assistance ?Equipment used: None ?Transfers: Bed to chair/wheelchair/BSC ?  ?  ?  ?  ?  ? Lateral/Scoot Transfers: Min assist, +2 physical assistance, +2 safety/equipment ?General transfer comment: Mod-max of 1 to attempt pivot, min of 2 for safety due to lower blood pressure after not getting up for multiple days ?  ?  ?Balance Overall balance assessment: Needs assistance ?Sitting-balance support: Bilateral upper extremity supported, Feet supported ?Sitting balance-Leahy Scale: Fair ?  ?  ?  ?  ?Standing balance comment: scooting to chair only ?  ?  ?  ?  ?  ?  ?  ?  ?  ?  ?  ?   ? ?ADL either performed or assessed with clinical judgement  ? ?ADL Overall ADL's : Needs assistance/impaired ?Eating/Feeding: Set up;Sitting ?  ?Grooming: Set up;Sitting ?  ?Upper Body Bathing: Set up;Sitting ?  ?  ?  ?  ?  ?  ?  ?Toilet Transfer: Minimal assistance;Stand-pivot;BSC/3in1;+2 for physical assistance ?Toilet  Transfer Details (indicate cue type and reason): could be a mod of 1 to transfer to [facility] (drop arm) +2 for safety ?  ?  ?  ?  ?Functional mobility during ADLs: Minimal assistance;+2 for physical assistance;+2 for safety/equipment ?General ADL Comments:  patient willing to practice scooting OOB but refusing to sit in chair ?  ? ?Extremity/Trunk Assessment   ?  ?  ?  ?  ?  ? ?Vision   ?  ?  ?Perception   ?  ?Praxis   ?  ? ?Cognition Arousal/Alertness: Awake/alert ?Behavior During Therapy: Flat affect ?Overall Cognitive Status: No family/caregiver present to determine baseline cognitive functioning ?  ?  ?  ?  ?  ?  ?  ?  ?  ?  ?  ?  ?  ?  ?  ?  ?General Comments: Flat but agreeable to OOB scoot practice once he finished his lunch, refused to stay in chair and returned back to bed ?  ?  ?   ?Exercises   ? ?  ?Shoulder Instructions   ? ? ?  ?General Comments    ? ? ?Pertinent Vitals/ Pain       Pain Assessment ?Pain Assessment: Faces ?Faces Pain Scale: Hurts a little bit ?Pain Location: R foot ?Pain Descriptors / Indicators: Grimacing, Guarding ?Pain Intervention(s): Limited activity within patient's tolerance, Monitored during session, Repositioned ? ?Home Living   ?  ?  ?  ?  ?  ?  ?  ?  ?  ?  ?  ?  ?  ?  ?  ?  ?  ?  ? ?  ?Prior Functioning/Environment    ?  ?  ?  ?   ? ?Frequency ? Min 2X/week  ? ? ? ? ?  ?Progress Toward Goals ? ?OT Goals(current goals can now be found in the care plan section) ? Progress towards OT goals: Progressing toward goals ? ?Acute Rehab OT Goals ?Patient Stated Goal: to go home ?OT Goal Formulation: With patient ?Time For Goal Achievement: 09/16/21 ?Potential to Achieve Goals: Fair  ?Plan Discharge plan remains appropriate   ? ?Co-evaluation ? ? ? PT/OT/SLP Co-Evaluation/Treatment: Yes ?Reason for Co-Treatment: For patient/therapist safety;To address functional/ADL transfers ?  ?OT goals addressed during session: ADL's and self-care ?  ? ?  ?AM-PAC OT "6 Clicks" Daily Activity     ?Outcome Measure ? ? Help from another person eating meals?: A Little ?Help from another person taking care of personal grooming?: A Little ?Help from another person toileting, which includes using toliet, bedpan, or urinal?: A Lot ?Help from another person  bathing (including washing, rinsing, drying)?: A Lot ?Help from another person to put on and taking off regular upper body clothing?: A Little ?Help from another person to put on and taking off regular lower body clothing?: A Lot ?6 Click Score: 15 ? ?  ?End of Session Equipment Utilized During Treatment: Oxygen;Gait belt ? ?OT Visit Diagnosis: Muscle weakness (generalized) (M62.81) ?  ?Activity Tolerance Treatment limited secondary to agitation ?  ?Patient Left in bed;with call bell/phone within reach ?  ?Nurse Communication Mobility status ?  ? ?   ? ?Time: 1202-1232 ?OT Time Calculation (min): 30 min ? ?Charges: OT General Charges ?$OT Visit: 1 Visit ?OT Treatments ?$Self Care/Home Management : 8-22 mins ? ?Corinne Ports E. Analiyah Lechuga, OTR/L ?Acute Rehabilitation Services ?[phone] ?[phone]  ? ?Corinne Ports Laikynn Pollio ?09/03/2021, 12:49 PM ?

## 2021-09-03 NOTE — Progress Notes (Signed)
FPTS Brief Note ?Reviewed patient's vitals, recent notes.  ?Vitals:  ? 09/02/21 2025 09/03/21 0031  ?BP: 117/83 110/61  ?Pulse: 79 81  ?Resp: 20 18  ?Temp: 98.3 ?F (36.8 ?C) 98.2 ?F (36.8 ?C)  ?SpO2: 98% 97%  ? ?At this time, no change in plan from day progress note.  ?Gladys Damme, MD ?Page [phone] with questions about this patient.  ? ? ?

## 2021-09-03 NOTE — Progress Notes (Signed)
Physical Therapy Treatment ?Patient Details ?Name: Rodney [name] ?MRN: [id number] ?DOB: Jan 05, 1951 ?Today's Date: 09/03/2021 ? ? ?History of Present Illness Patient is a 71 yo male presenting to the ED with SOB on 08/19/21. Admitted same day with acute respiratory failure and COPD exacerbation. Patient with 4 admissions within the last 6 months. 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  PMH is significant for DM 2, obesity, tobacco abuse, hypertension, protein calorie malnutrition, aortic stenosis, anemia chronic disease, PVD, HFrEF, PAF, status post BKA, CAD, CKD stage III, COPD, COVID+ ? ?  ?PT Comments  ? ? Pt asleep in darkened room on entry, wakes easily and complains about be woken up. Pt reluctantly agreeable to practice scoot transfer from bed to recliner with Darco shoe in place. Pt modA for coming to EoB. Pt refuse attempt at stand pivot so drop arm recliner procured and pt able to pivot with min Ax2. Once in recliner pt refuses to stay up and requires mod-maxA back to bed. Pt reports he is leaving today and does not need to stay up. D/c plan remains appropriate, however insurance denied and pt going home with girlfriend and HHPT.  ?  ?Recommendations for follow up therapy are one component of a multi-disciplinary discharge planning process, led by the attending physician.  Recommendations may be updated based on patient status, additional functional criteria and insurance authorization. ? ?Follow Up Recommendations ? Skilled nursing-short term rehab (<3 hours/day) ?  ?  ?Assistance Recommended at Discharge Frequent or constant Supervision/Assistance  ?Patient can return home with the following A lot of help with walking and/or transfers;A little help with bathing/dressing/bathroom;Assistance with cooking/housework;Assistance with feeding;Direct supervision/assist for medications management;Direct supervision/assist for financial management;Assist for transportation;Help with stairs or ramp for entrance ?   ?Equipment Recommendations ? None recommended by PT  ?  ?Recommendations for Other Services   ? ? ?  ?Precautions / Restrictions Precautions ?Precautions: Fall ?Precaution Comments: L BKA ?Required Braces or Orthoses: Other Brace ?Other Brace: R darco shoe (in room) ?Restrictions ?Weight Bearing Restrictions: Yes ?RLE Weight Bearing: Partial weight bearing ?Other Position/Activity Restrictions: heel weightbearing in darco shoe on the R  ?  ? ?Mobility ? Bed Mobility ?Overal bed mobility: Modified Independent ?  ?  ?Sidelying to sit: Mod assist ?  ?  ?  ?General bed mobility comments: pulling up on PT with R arm, PT providing assist at trunk to come fully upright ?  ? ?Transfers ?Overall transfer level: Needs assistance ?Equipment used: None ?Transfers: Bed to chair/wheelchair/BSC ?  ?  ?  ?  ?  ? Lateral/Scoot Transfers: Min assist, +2 physical assistance, +2 safety/equipment, Mod assist ?General transfer comment: Mod-max of 1 to attempt pivot, min of 2 for safety due to lower blood pressure after not getting up for multiple days ?  ? ? ? ?  ?Balance Overall balance assessment: Needs assistance ?Sitting-balance support: Bilateral upper extremity supported, Feet supported ?Sitting balance-Leahy Scale: Fair ?  ?  ?  ?  ?Standing balance comment: scooting to chair only ?  ?  ?  ?  ?  ?  ?  ?  ?  ?  ?  ?  ? ?  ?Cognition Arousal/Alertness: Awake/alert ?Behavior During Therapy: Flat affect ?Overall Cognitive Status: No family/caregiver present to determine baseline cognitive functioning ?  ?  ?  ?  ?  ?  ?  ?  ?  ?  ?  ?  ?  ?  ?  ?  ?General  Comments: Flat but agreeable to OOB scoot practice once he finished his lunch, refused to stay in chair and returned back to bed ?  ?  ? ?  ?   ?General Comments General comments (skin integrity, edema, etc.): VSS on 2L O2 via [place] ?  ?  ? ?Pertinent Vitals/Pain Pain Assessment ?Pain Assessment: Faces ?Faces Pain Scale: Hurts a little bit ?Pain Location: R foot ?Pain Descriptors /  Indicators: Grimacing, Guarding ?Pain Intervention(s): Limited activity within patient's tolerance, Monitored during session, Repositioned  ? ? ? ?PT Goals (current goals can now be found in the care plan section) Acute Rehab PT Goals ?PT Goal Formulation: With patient ?Time For Goal Achievement: 09/10/21 ?Potential to Achieve Goals: Fair ?Progress towards PT goals: Progressing toward goals ? ?  ?Frequency ? ? ? Min 3X/week ? ? ? ?  ?PT Plan Current plan remains appropriate  ? ? ?Co-evaluation PT/OT/SLP Co-Evaluation/Treatment: Yes ?Reason for Co-Treatment: For patient/therapist safety;Other (comment) (pt only agreeable to one session today) ?PT goals addressed during session: Mobility/safety with mobility ?OT goals addressed during session: ADL's and self-care ?  ? ?  ?AM-PAC PT "6 Clicks" Mobility   ?Outcome Measure ? Help needed turning from your back to your side while in a flat bed without using bedrails?: None ?Help needed moving from lying on your back to sitting on the side of a flat bed without using bedrails?: A Lot ?Help needed moving to and from a bed to a chair (including a wheelchair)?: A Lot ?Help needed standing up from a chair using your arms (e.g., wheelchair or bedside chair)?: Total ?Help needed to walk in hospital room?: Total ?Help needed climbing 3-5 steps with a railing? : Total ?6 Click Score: 11 ? ?  ?End of Session Equipment Utilized During Treatment: Oxygen ?Activity Tolerance: Patient tolerated treatment well ?Patient left: in bed;with call bell/phone within reach ?Nurse Communication: Mobility status ?PT Visit Diagnosis: Muscle weakness (generalized) (M62.81);Other abnormalities of gait and mobility (R26.89) ?  ? ? ?Time: 1202-1232 ?PT Time Calculation (min) (ACUTE ONLY): 30 min ? ?Charges:  $Therapeutic Activity: 8-22 mins          ?          ? ?Asheley Hellberg B. Migdalia Dk PT, DPT ?Acute Rehabilitation Services ?Please use secure chat or  ?Call Office [phone] ? ? ? ?[place] ?09/03/2021, 3:03 PM ? ?

## 2021-09-03 NOTE — TOC Transition Note (Signed)
Transition of Care (TOC) - CM/SW Discharge Note ? ? ?Patient Details  ?Name: Rodney [name] ?MRN: [id number] ?Date of Birth: April 23, 1951 ? ?Transition of Care (TOC) CM/SW Contact:  ?Reece Agar, LCSWA ?Phone Number: ?09/03/2021, 12:46 PM ? ? ?Clinical Narrative:   Pt will dc home with HH, pt is set up with [facility] and [facility] has been set up for dc. ? ? ?Final next level of care: [place] ?Barriers to Discharge: Continued Medical Work up, Ship broker ? ? ?Patient Goals and CMS Choice ?Patient states their goals for this hospitalization and ongoing recovery are:: Pt wants to be able to return home. ?CMS Medicare.gov Compare Post Acute Care list provided to:: Patient ?Choice offered to / list presented to : Patient, Spouse ? ?Discharge Placement ?  ?           ?  ?  ?  ?  ? ?Discharge Plan and Services ?  ?Discharge Planning Services: CM Consult ?Post Acute Care Choice: [place]          ?  ?DME Agency: NA ?  ?  ?  ?HH Arranged: RN, PT, OT ?[place] Agency: [place] ?Date HH Agency Contacted: 08/20/21 ?Time [place]: 6122 ?Representative spoke with at [place]: Tommi Rumps ? ?Social Determinants of Health (SDOH) Interventions ?  ? ? ?Readmission Risk Interventions ? ?  08/21/2021  ?  5:10 PM 06/26/2021  ?  4:36 PM 05/16/2021  ?  3:13 PM  ?Readmission Risk Prevention Plan  ?Transportation Screening Complete Complete Complete  ?Medication Review Press photographer) Complete Complete Complete  ?PCP or Specialist appointment within 3-5 days of discharge Complete Complete Complete  ?[place] or Home Care Consult Complete Complete Complete  ?SW Recovery Care/Counseling Consult Complete Complete Complete  ?Palliative Care Screening Not Applicable Not Applicable Not Applicable  ?[place] Not Applicable Complete Not Applicable  ? ? ? ? ? ?

## 2021-09-03 NOTE — Plan of Care (Signed)

## 2021-09-03 NOTE — Discharge Instructions (Signed)
TAKE ALL MEDICATIONS AS DIRECTED AND AT THE TIMES DIRECTED ?

## 2021-09-03 NOTE — Discharge Summary (Signed)
Family Medicine Teaching Service ?Hospital Discharge Summary ? ?Patient name: Rodney [name] Medical record number: [id number] ?Date of birth: 11/27/1950 Age: 71 y.o. Gender: male ?Date of Admission: 08/19/2021  Date of Discharge: 08/31/2021 ?Admitting Physician: Gladys Damme, MD ? ?Primary Care Provider: [name], MD ?Consultants: Cardiology, VVS ? ?Indication for Hospitalization:  ?COPD exacerbation ?C. difficile ?TAVR preop ? ?Discharge Diagnoses/Problem List:  ?Principal Problem: ?  Acute on chronic respiratory failure with hypoxia and hypercapnia (HCC) ?Active Problems: ?  Hypertension associated with chronic kidney disease due to type 2 diabetes mellitus ([place]) ?  DM (diabetes mellitus), type 2 with neurological complications ([place]) ?  HFrEF (heart failure with reduced ejection fraction) ([place]) ?  Severe aortic stenosis ?  AF (paroxysmal atrial fibrillation) ([place]) ?  Hx of BKA, left ([place]) ?  CAD (coronary artery disease) ?  AKI (acute kidney injury) ([place]) ?  Chronic respiratory failure with hypoxia and hypercapnia (HCC) ?  Acute kidney injury superimposed on chronic kidney disease ([place]) ?  Chronic pleural effusion, Left ?  COPD exacerbation ([place]) ?  History of multiple blood transfusions ?  History of amputation of right great toe ([place]) ?  Emphysema of lung ([place]) ?  Atherosclerosis of aorta ([place]) ?  Pleural effusion on right ?  Chronic blood loss anemia ?  ? ?Disposition:  ?Skilled nursing-short term rehab (<3 hours/day)  ?DME: None recommended by PT (09/02/21)  ?   ?Discharge Condition:  ?Stable  ? ?Discharge Exam:  ?Temp:  [97.7 ?F (36.5 ?C)-98.3 ?F (36.8 ?C)] 98 ?F (36.7 ?C) (05/10 1106) ?Pulse Rate:  [72-81] 72 (05/10 1106) ?Resp:  [14-20] 18 (05/10 1106) ?BP: (110-126)/(61-83) 119/77 (05/10 1106) ?SpO2:  [97 %-99 %] 98 % (05/10 1106) ?Weight:  [88.4 kg] 88.4 kg (05/10 0031)  ? ?Constitutional:   ?   General: He is awake. He is not in acute distress. ?   Appearance: He is not ill-appearing,  toxic-appearing or diaphoretic.  ?HENT:  ?   Head: Normocephalic and atraumatic.  ?Cardiovascular:  ?   Rate and Rhythm: Normal rate and regular rhythm.  ?   Heart sounds: Murmur heard.  ?  No friction rub. No gallop.  ?Pulmonary:  ?   Effort: Pulmonary effort is normal. No respiratory distress.  ?   Breath sounds: Decreased breath sounds and wheezing (very mild end expiratory wheeze) present. No rales.  ?Abdominal:  ?   General: Bowel sounds are normal.  ?   Palpations: Abdomen is soft.  ?   Tenderness: There is no abdominal tenderness. There is no guarding or rebound.  ?Musculoskeletal:  ?   Right lower leg: No tenderness.  ?   Left lower leg: No tenderness.  ?Skin: ?   General: Skin is warm and dry.  ?Neurological:  ?   General: No focal deficit present.  ?   Mental Status: He is alert and oriented to person, place, and time. ? ?Brief Hospital Course:  ?[name] is a 71 y.o.male with a history of COPD, HFpEF 2/2 severe aortic stenosis, anemia of chronic disease, CKD 3, PAD s/p BKA, CAD, heart failure with recovered ejection fraction, obesity, diabetes 2.  Who was admitted to the family medicine teaching Service at [facility] for COPD exacerbation. His hospital course is detailed below: ? ?COPD Exacerbation ?Patient presented in severe respiratory distress requiring BiPAP support in the emergency department.  ABG showed a PCO2 of 72.6.  Patient was started on cefepime and Solu-Medrol.  Cefepime was de-escalated to Cefdinir  on 4/27.  He was transitioned from Solu-Medrol to p.o. prednisone on 4/26 and completed a total 5-day course.  Respiratory support was weaned from BiPAP to nasal cannula.  He was weaned to his home requirement of 2 L supplemental O2. ? ?Pleural effusions, bilateral ?Patient is not a chronic left sided pleural effusion since at least July 2022.  A CT chest was obtained on admission to better characterize the effusion and demonstrated a slight increase in size along with a new moderately sized  right pleural effusion. This was improved, but still present.  ? ?Anemia of Chronic Kidney Disease ?Patient's hemoglobin on admission was 9.5.  It dropped to 7.6 on hospital day 2.  He received a 1 unit transfusion on 4/26 with recovery of his hemoglobin to 8.5.  Received IV iron during hospitalization.  Hemoglobin at discharge was stable at 8.6. ?  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 VVS amputated 2nd right digit with no complications on May 2nd.  ?-he has f/u appt with surgery on 09/17/2021 at 1420.   ?-continue daily dressing changes and heel weight bearing only. ?-will continue to check in periodically while still in the hospital. ? ?pAF, rate controlled and AC, stable ?Warfarin, Coreg ? ?Severe Aortic Stenosis ?Cardiology was consulted and ultimately decided to proceed with TAVR work-up after discussing with vascular surgery in multidisciplinary heart valve team given his significant comorbidities including advanced chronic lung disease and severe peripheral arterial disease and multiple revascularization surgeries.  ?Work-up involved amputation see above.  ? ?C. Diff s/p fidoxamycin x10d (5/9) and resolved ?H/o recurrent c. diff ? ?Other chronic conditions were medically managed with home medications and formulary alternatives as necessary (GERD, insomnia, DM2, CKD 3) ? ?PCP Follow-up Recommendations: ?1. Increasing size of mediastinal lymph nodes, while potentially reactive show increase over time since previous imaging in the RIGHT paratracheal chain. Consider short interval follow-up or PET imaging ?for further evaluation outside of the acute setting, in 4-6 weeks. ?2. Given IV iron while hospitalized. Likely anemia of chronic disease. PCP to follow up.  ?3. Last work up is oral surgery before TAVR. Frederik Schmidt, MD consulted and agreed to OP f/u before TAVR. Needs  transportation assistance, TOC was consulted.  ?4. Concerned for depression 2/2 medical problems, started on trazodone for sleep, increase to >'150mg'$  for depression or switching to 1st line SSRI or consider trazodone as adjunct to SSRI. Prozac is great for compliance.  ? ? ?Significant Labs and Imaging:  ?Recent Labs  ?Lab 08/28/21 ?0242 08/29/21 ?0334 08/30/21 ?0140 09/02/21 ?0403  ?WBC 6.5 5.8  --  6.9  ?HGB 8.2* 8.0* 8.2* 8.5*  ?HCT 25.6* 25.3* 25.4* 26.3*  ?PLT 147* 152  --  168  ? ?Recent Labs  ?Lab 08/29/21 ?0334  ?NA 135  ?K 3.8  ?CL 98  ?CO2 31  ?GLUCOSE 171*  ?BUN 20  ?CREATININE 1.30*  ?CALCIUM 8.0*  ? ?EKG Interpretation ?Date/Time:  Tuesday August 19 2021 05:38:30 EDT ?Ventricular Rate:  108 ?PR Interval:  152 ?QRS Duration: 111 ?QT Interval:  361 ?QTC Calculation: 484 ?R Axis:   47 ?Text Interpretation: Ectopic atrial tachycardia, unifocal Ventricular premature complex Repol abnrm, severe global ischemia (LM/MVD) ST depressions similar to prior EKG Confirmed by Thayer Jew [phone]) on 08/19/2021 5:43:15 AM ?  ?Results for orders placed or performed during the hospital encounter  of 08/19/21 (from the past 24 hour(s))  ?Glucose, capillary     Status: Abnormal  ? Collection Time: 09/02/21  4:05 PM  ?Result Value Ref Range  ? Glucose-Capillary 109 (H) 70 - 99 mg/dL  ?Glucose, capillary     Status: Abnormal  ? Collection Time: 09/02/21  9:04 PM  ?Result Value Ref Range  ? Glucose-Capillary 186 (H) 70 - 99 mg/dL  ? Comment 1 Notify RN   ? Comment 2 Document in Chart   ?Protime-INR     Status: None  ? Collection Time: 09/03/21  4:14 AM  ?Result Value Ref Range  ? Prothrombin Time 15.0 11.4 - 15.2 seconds  ? INR 1.2 0.8 - 1.2  ?Glucose, capillary     Status: None  ? Collection Time: 09/03/21  6:13 AM  ?Result Value Ref Range  ? Glucose-Capillary 92 70 - 99 mg/dL  ? Comment 1 Notify RN   ? Comment 2 Document in Chart   ?Glucose, capillary     Status: Abnormal  ? Collection Time: 09/03/21 11:05 AM  ?Result Value  Ref Range  ? Glucose-Capillary 115 (H) 70 - 99 mg/dL  ?  ?DG Orthopantogram ? ?Result Date: 08/21/2021 ?CLINICAL DATA:  Preoperative testing EXAM: ORTHOPANTOGRAM/PANORAMIC COMPARISON:  None. FINDINGS: Prominent d

## 2021-09-03 NOTE — Progress Notes (Addendum)
Family Medicine Teaching Service ?Daily Progress Note ?Intern Pager: [phone] ? ?Patient name: Rodney [name] Medical record number: [id number] ?Date of birth: [birth year] Age: 71 y.o. Gender: male ? ?Primary Care Provider: [name], MD ?Consultants: Cardiology, vascular surgery ?Code Status: Full code ? ?Pt Overview and Major Events to Date:  ?4/25-admitted, placed on BiPAP briefly then weaned to nasal cannula, started cefepime and given IV methylprednisolone ?4/26-transitioned steroids to oral prednisone, transfused 1u PRBC ?4/27-cefepime transitioned to cefdinir ?4/29-completed course of cefdinir and prednisone ?5/1-B/l heart cath  ?5/2-Right toe amputation ?5/5-restarted warfarin with heparin bridge ?5/6-d/c to snf delayed to due insurance approval ?5/7-pending insurance approval for SNF ?  ?Assessment and Plan: ?Rodney [name] is a 61-year initially admitted for COPD exacerbation (now resolved).  Now experiencing C. difficile recurrence and undergoing preop preparation for TAVR, which includes L+RHC and amputation of gangrenous toe. PMHx significant for: COPD, HFrEF 2/2 severe AS, CAD, PAD s/p left BKA, PAF on warfarin, T2DM, CKD stage III, anemia of chronic disease, protein calorie malnutrition. ?  ?Patient is medically stable for discharge to SNF ? ?AF, rate controlled and AC, stable ?HR within normal limits.  Goal INR >2 ?Continued home Coreg and warfarin per pharmacy with Lovenox bridge ?INR and PTT per pharmacy ?Already discussed that with SNF, once insurance has not authorized, SNF is very comfortable with continuing warfarin titration with Lovenox bridge ? ?C. difficile recurrence s/p fidoxamycin x10d (5/9), resolved ?No white count yesterday.  No fever.  No diarrhea. ?Completed fidoxamycin 5/9 ?DC enteric precautions ? ?Poor dentition ?Required teeth extraction for TAVR prep. Patient will have to follow up with oral surgery outpatient. ? ?HFpEF 2/2 Severe AS & CAD s/p L&R Heart Cath (5/1),  stable ?Euvolemic, baseline O2 requirement 2 L nasal cannula.  Adequate urine output. At dry weight 93 kg ?Continue Lasix, losartan, Coreg ? 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 ?Coreg, plavix ?Dressing changes per surgery ?Heel weight bearing only with darco shoe, per surg ?f/u in 3-4 weeks for wound check/suture removal ?  ?Anemia of CKD and iron deficiency s/p IV iron, stable ?Stable, 8.5 on 5/9 ?  ?Acute on chronic hypoxemic respiratory failure secondary to COPD exacerbation; resolved ?At baseline ?Duo nebs BID PRN ?Continued Breo Ellipta daily ?O2 goal: 88-92% ?  ?CKD III, improving and stable ?Stable, baseline Cr 1.30 5/4 ?  ?T2DM, controlled ?A1c 5.9 (08/20/2021).  Fasting cbgs 92. Cbg<180s.  ?Continued Levemir 15 units daily ?CBGs ? ?Mediastinal lymph nodes ?Outpatient PET follow-up ?  ?GERD ?Continue protonix 40 mg ?  ?Insomnia ?Continue nightly trazodone scheduled ?  ?De-Conditioning ?OOB order ?Encourage mobilization ?  ? ? ?FEN/GI: Heart healthy & carb mod ?PPx: Protonix ?VTE: Scd's start: 08/25/21 1641, warfarin per pharm and lovenox bridge ? ? ?Dispo: Skilled nursing-short term rehab (<3 hours/day)  ?DME: None recommended by PT (09/02/21) ?Barrier: insurance authorization ? ?Subjective:  ?NAEON ? ?No family at bedside. ? ?Patient was seen resting watching TV. NO acute concerns. No abd pain, sob, cp. No diarrhea yesterday. ? ?Objective: ?Temp:  [97.7 ?F (36.5 ?C)-98.3 ?F (36.8 ?C)] 97.7 ?F (36.5 ?C) (05/10 5397) ?Pulse Rate:  [73-81] 73 (05/10 0711) ?Cardiac Rhythm: Atrial fibrillation (05/09 1900) ?Resp:  [14-20] 14 (05/10 0711) ?BP: (110-126)/(61-83) 115/64 (05/10 0711) ?SpO2:  [97 %-99 %] 97 % (05/10 0817) ?Weight:  [88.4 kg] 88.4 kg (05/10 0031)  ?Physical Exam ?Vitals and nursing note reviewed.  ?  Constitutional:   ?   General: He is awake. He is not in acute distress. ?   Appearance: He is not  ill-appearing, toxic-appearing or diaphoretic.  ?HENT:  ?   Head: Normocephalic and atraumatic.  ?Cardiovascular:  ?   Rate and Rhythm: Normal rate and regular rhythm.  ?   Heart sounds: Murmur heard.  ?  No friction rub. No gallop.  ?Pulmonary:  ?   Effort: Pulmonary effort is normal. No respiratory distress.  ?   Breath sounds: Decreased breath sounds and wheezing (very mild end expiratory wheeze) present. No rales.  ?Abdominal:  ?   General: Bowel sounds are normal.  ?   Palpations: Abdomen is soft.  ?   Tenderness: There is no abdominal tenderness. There is no guarding or rebound.  ?Musculoskeletal:  ?   Right lower leg: No tenderness.  ?   Left lower leg: No tenderness.  ?Skin: ?   General: Skin is warm and dry.  ?Neurological:  ?   General: No focal deficit present.  ?   Mental Status: He is alert and oriented to person, place, and time.  ?  ? ?Laboratory: ?Recent Labs  ?Lab 08/28/21 ?0242 08/29/21 ?0334 08/30/21 ?0140 09/02/21 ?0403  ?WBC 6.5 5.8  --  6.9  ?HGB 8.2* 8.0* 8.2* 8.5*  ?HCT 25.6* 25.3* 25.4* 26.3*  ?PLT 147* 152  --  168  ? ?Recent Labs  ?Lab 08/29/21 ?0334  ?NA 135  ?K 3.8  ?CL 98  ?CO2 31  ?BUN 20  ?CREATININE 1.30*  ?CALCIUM 8.0*  ?GLUCOSE 171*  ? ? ?Merrily Brittle, DO ?09/03/2021, 8:31 AM ?PGY-1, Gas ?[facility] Intern pager: [phone], text pages welcome  ?

## 2021-09-03 NOTE — Progress Notes (Signed)
ANTICOAGULATION CONSULT NOTE ? ?Pharmacy Consult for enoxaparin andwarfarin ?Indication: atrial fibrillation and hx of DVT ? ?No Known Allergies ? ?Patient Measurements: ?Height: 6' (182.9 cm) ?Weight: 88.4 kg (194 lb 14.2 oz) ?IBW/kg (Calculated) : 77.6 ? ? ?Vital Signs: ?Temp: 97.7 ?F (36.5 ?C) (05/10 1856) ?Temp Source: Oral (05/10 3149) ?BP: 115/64 (05/10 0711) ?Pulse Rate: 73 (05/10 0711) ? ?Labs: ?Recent Labs  ?  09/01/21 ?7026 09/02/21 ?0403 09/03/21 ?0414  ?HGB  --  8.5*  --   ?HCT  --  26.3*  --   ?PLT  --  168  --   ?LABPROT 14.4 14.7 15.0  ?INR 1.1 1.2 1.2  ? ? ? ?Estimated Creatinine Clearance: 58.9 mL/min (A) (by C-G formula based on SCr of 1.3 mg/dL (H)). ? ? ?Assessment: ?71 YO male with medical history significant for atrial fibrillation on PTA warfarin 2.'5mg'$  daily who is admitted with dyspnea due to COPD exacerbation. Patient now s/p R/LHC (5/01) and 2nd toe amputation (5/02). Pt has been on IV heparin while warfarin was held. On 5/05, Heparin was discontinued and pharmacy consulted for warfarin dosing with enoxaparin bridge.  ? ?Will bridge with therapeutic enoxaparin until INR is within goal range. PTA warfarin dose was 2.'5mg'$  PO daily.  ? ?Last CBC (5/9) remains stable, hgb 8.5, platelets 168. INR remains subtherapeutic and relatively unchanged at 1.2. Will increase warfarin dose today.  Anticipate patient being able to resume PTA warfarin regimen once discharged to SNF. ? ? ?Goal of Therapy:  ?INR 2-2.5 (decreased due to normocytic anemia in past) ?Monitor platelets by anticoagulation protocol: Yes ?  ?Plan:  ?Continue Enoxaparin '90mg'$  Q12h ?Give warfarin 7.5 mg PO x1 dose ?Check INR daily while on warfarin ?Continue to monitor H&H and platelets ? ? ? ?Thank you for allowing pharmacy to be a part of this patient?s care. ?[employer], Pharm.D., BCPS ?Clinical Pharmacist ?Clinical phone for 09/03/2021 from 7:30-3:00 is [phone]. ? ?**Pharmacist phone directory can be found on [email or web address] listed  under [place]. ? ?09/03/2021 8:47 AM ? ?

## 2021-09-03 NOTE — Discharge Summary (Addendum)
Family Medicine Teaching Service ?Hospital Discharge Summary ? ?Patient name: Rodney [name] Medical record number: [id number] ?Date of birth: 1951/03/12 Age: 71 y.o. Gender: male ?Date of Admission: 08/19/2021  Date of Discharge: 09/03/2021 ?Admitting Physician: Gladys Damme, MD ? ?Primary Care Provider: [name], MD ?Consultants: cardiology, vascular surgery, oral surgery ? ?Indication for Hospitalization:  ?COPD exacerbation ?C. Diff ?TAVR pre-op  ? ?Discharge Diagnoses/Problem List:  ?Principal Problem: ?  Acute on chronic respiratory failure with hypoxia and hypercapnia (HCC) ?Active Problems: ?  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  DM (diabetes mellitus), type 2 with neurological complications ([place]) ?  HFrEF (heart failure with reduced ejection fraction) ([place]) ?  Severe aortic stenosis ?  AF (paroxysmal atrial fibrillation) ([place]) ?  Hx of BKA, left ([place]) ?  CAD (coronary artery disease) ?  AKI (acute kidney injury) ([place]) ?  Chronic respiratory failure with hypoxia and hypercapnia (HCC) ?  Acute kidney injury superimposed on chronic kidney disease ([place]) ?  Chronic pleural effusion, Left ?  COPD exacerbation ([place]) ?  History of multiple blood transfusions ?  History of amputation of right great toe ([place]) ?  Emphysema of lung ([place]) ?  Atherosclerosis of aorta ([place]) ?  Pleural effusion on right ?  Chronic blood loss anemia ?  ? ?Disposition:  ?Home with [facility] PT/OT/RN/SW ?   ?Discharge Condition:  ?Stable  ? ?Discharge Exam:  ?Temp:  [97.7 ?F (36.5 ?C)-98.3 ?F (36.8 ?C)] 98 ?F (36.7 ?C) (05/10 1106) ?Pulse Rate:  [72-81] 72 (05/10 1106) ?Resp:  [14-20] 18 (05/10 1106) ?BP: (110-126)/(61-83) 119/77 (05/10 1106) ?SpO2:  [97 %-99 %] 98 % (05/10 1241) ?Weight:  [88.4 kg] 88.4 kg (05/10 0031)  ? ?Physical Exam ?Vitals and nursing note reviewed.  ?Constitutional:   ?   General: He is awake. He is not in acute distress. ?   Appearance: He is not  ill-appearing, toxic-appearing or diaphoretic.  ?HENT:  ?   Head: Normocephalic and atraumatic.  ?Cardiovascular:  ?   Rate and Rhythm: Normal rate and regular rhythm.  ?   Heart sounds: Murmur heard.  ?  No friction rub. No gallop.  ?Pulmonary:  ?   Effort: Pulmonary effort is normal. No respiratory distress.  ?   Breath sounds: Decreased breath sounds and wheezing (very mild end expiratory wheeze) present. No rales.  ?Abdominal:  ?   General: Bowel sounds are normal.  ?   Palpations: Abdomen is soft.  ?   Tenderness: There is no abdominal tenderness. There is no guarding or rebound.  ?Musculoskeletal:  ?   Right lower leg: No tenderness.  ?   Left lower leg: No tenderness.  ?Skin: ?   General: Skin is warm and dry.  ?Neurological:  ?   General: No focal deficit present.  ?   Mental Status: He is alert and oriented to person, place, and time.  ? ?Brief Hospital Course:  ?Rodney [name] is a 70 y.o.male with a history of COPD, HFpEF 2/2 severe aortic stenosis, anemia of chronic disease, CKD 3, PAD s/p BKA, CAD, heart failure with recovered ejection fraction, obesity, diabetes 2.  Who was admitted to the family medicine teaching Service at [facility] for COPD exacerbation. His hospital course is detailed below: ? ?COPD Exacerbation ?Patient presented in severe respiratory distress requiring BiPAP support in the emergency department.  ABG showed a PCO2 of 72.6.  Patient was started on cefepime and Solu-Medrol.  Cefepime was  de-escalated to Cefdinir on 4/27.  He was transitioned from Solu-Medrol to p.o. prednisone on 4/26 and completed a total 5-day course.  Respiratory support was weaned from BiPAP to nasal cannula.  He was weaned to his home requirement of 2 L supplemental O2. ? ?Pleural effusions, bilateral ?Patient is not a chronic left sided pleural effusion since at least July 2022.  A CT chest was obtained on admission to better characterize the effusion and demonstrated a slight increase in size along with a new  moderately sized right pleural effusion. This was improved, but still present.  ? ?Anemia of Chronic Kidney Disease ?Patient's hemoglobin on admission was 9.5.  It dropped to 7.6 on hospital day 2.  He received a 1 unit transfusion on 4/26 with recovery of his hemoglobin to 8.5.  Received IV iron during hospitalization.  Hemoglobin at discharge was stable at 8.6. ?  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 VVS amputated 2nd right digit with no complications on May 2nd.  ?-he has f/u appt with surgery on 09/17/2021 at 1420.   ?-continue daily dressing changes and heel weight bearing only. ?-will continue to check in periodically while still in the hospital. ? ?pAF, rate controlled and AC, stable ?Warfarin, Coreg ? ?Severe Aortic Stenosis ?Cardiology was consulted and ultimately decided to proceed with TAVR work-up after discussing with vascular surgery in multidisciplinary heart valve team given his significant comorbidities including advanced chronic lung disease and severe peripheral arterial disease and multiple revascularization surgeries.  ?Work-up involved amputation see above.  ? ?C. Diff s/p fidoxamycin x10d (5/9) and resolved ?H/o recurrent c. diff ? ?Other chronic conditions were medically managed with home medications and formulary alternatives as necessary (GERD, insomnia, DM2, CKD 3) ? ?PCP Follow-up Recommendations: ?1. Increasing size of mediastinal lymph nodes, while potentially reactive show increase over time since previous imaging in the RIGHT paratracheal chain. Consider short interval follow-up or PET imaging ?for further evaluation outside of the acute setting, in 4-6 weeks. ?2. Given IV iron while hospitalized. Likely anemia of chronic disease. PCP to follow up.  ?3. Last work up is oral surgery before TAVR. Frederik Schmidt, MD consulted and agreed to OP f/u  before TAVR. Needs transportation assistance, TOC was consulted.  ?4. Concerned for depression 2/2 medical problems, started on trazodone for sleep, increase to >'150mg'$  for depression or switching to 1st line SSRI or consider trazodone as adjunct to SSRI. Prozac is great for compliance.  ? ?Procedures: ?LHC ?Toe amputation ? ?Significant Labs and Imaging:  ?Recent Labs  ?Lab 08/28/21 ?0242 08/29/21 ?0334 08/30/21 ?0140 09/02/21 ?0403  ?WBC 6.5 5.8  --  6.9  ?HGB 8.2* 8.0* 8.2* 8.5*  ?HCT 25.6* 25.3* 25.4* 26.3*  ?PLT 147* 152  --  168  ? ?Recent Labs  ?Lab 08/29/21 ?0334  ?NA 135  ?K 3.8  ?CL 98  ?CO2 31  ?GLUCOSE 171*  ?BUN 20  ?CREATININE 1.30*  ?CALCIUM 8.0*  ? ?EKG Interpretation ? ?Date/Time:  Tuesday August 19 2021 05:38:30 EDT ?Ventricular Rate:  108 ?PR Interval:  152 ?QRS Duration: 111 ?QT Interval:  361 ?QTC Calculation: 484 ?R Axis:   47 ?Text Interpretation: Ectopic atrial tachycardia, unifocal Ventricular premature complex Repol abnrm, severe global ischemia (LM/MVD) ST depressions similar to prior EKG Confirmed by Thayer Jew [phone]) on 08/19/2021 5:43:15 AM ?  ?  ?  DG Orthopantogram ? ?Result Date: 08/21/2021 ?CLINICAL DATA:  Preoperative testing EXAM: ORTHOPANTOGRAM/PANORAMIC COMPARISON:  None. FINDINGS: Prominent dental caries are present within teeth 6, 7, 9, 10. Smaller caries are present in the remaining maxillary teeth. Modest caries are present teeth 18 and 19. The mandible is intact and located. No other osseous abnormalities are present. IMPRESSION: 1. Prominent dental caries within teeth 6, 7, 9, 10. 2. Modest caries involving teeth 18 and 19. Electronically Signed   By: San Morelle M.D.   On: 08/21/2021 20:44  ? ?CT CHEST WO CONTRAST ? ?Result Date: 08/19/2021 ?CLINICAL DATA:  Chronic dyspnea in a 71 year old male. EXAM: CT CHEST WITHOUT CONTRAST TECHNIQUE: Multidetector CT imaging of the chest was performed following the standard protocol without IV contrast. RADIATION DOSE  REDUCTION: This exam was performed according to the departmental dose-optimization program which includes automated exposure control, adjustment of the mA and/or kV according to patient size and/or use of iterative reconstruct

## 2021-09-03 NOTE — Care Management Important Message (Signed)
Important Message ? ?Patient Details  ?Name: Rodney [name] ?MRN: [id number] ?Date of Birth: [birth year] ? ? ?Medicare Important Message Given:  Yes ? ? ? ? ?Shelda Altes ?09/03/2021, 7:57 AM ?

## 2021-09-05 ENCOUNTER — Telehealth: Payer: Self-pay

## 2021-09-05 ENCOUNTER — Ambulatory Visit: Payer: Medicare PPO

## 2021-09-05 ENCOUNTER — Other Ambulatory Visit (HOSPITAL_COMMUNITY): Payer: Self-pay

## 2021-09-05 ENCOUNTER — Ambulatory Visit: Payer: Medicare PPO | Admitting: Cardiovascular Disease

## 2021-09-05 DIAGNOSIS — J432 Centrilobular emphysema: Secondary | ICD-10-CM

## 2021-09-05 DIAGNOSIS — J441 Chronic obstructive pulmonary disease with (acute) exacerbation: Secondary | ICD-10-CM

## 2021-09-05 DIAGNOSIS — I5020 Unspecified systolic (congestive) heart failure: Secondary | ICD-10-CM

## 2021-09-05 DIAGNOSIS — Z789 Other specified health status: Secondary | ICD-10-CM

## 2021-09-05 NOTE — Chronic Care Management (AMB) (Signed)
Transition Care Management Follow-up Telephone Call ?Date of discharge and from where: Rodney [name] 09/03/21 ?How have you been since you were released from the hospital? no ?Any questions or concerns? Yes The patient did not have one of his medications.  [name] and I called the [facility] and they told her to have his pharmacy to call them and they will have the medications sent there. ? ?Items Reviewed: ?Did the pt receive and understand the discharge instructions provided? Yes  ?Medications obtained and verified? Yes  ?Other?  N/a ?Any new allergies since your discharge? No  ?Dietary orders reviewed? No ?Do you have support at home? Yes  ? ?Home Care and Equipment/Supplies: ?Were home health services ordered? yes ?If so, what is the name of the agency? [facility]  ?Has the agency set up a time to come to the patient's home? [name] is going to call them back to have them to come out to the home. ?Were any new equipment or medical supplies ordered?  No ?What is the name of the medical supply agency? N/a ?Were you able to get the supplies/equipment? not applicable ?Do you have any questions related to the use of the equipment or supplies? No ? ?Functional Questionnaire: (I = Independent and D = Dependent) ?ADLs: D ? ?Bathing/Dressing- D ? ?Meal Prep- D ? ?Eating- I ? ?Maintaining continence- D ? ?Transferring/Ambulation- D ? ?Managing Meds- D ? ?Follow up appointments reviewed: ? ?PCP Hospital f/u appt confirmed? Yes  Scheduled to see Access to care on 09-08-21 @ 1030 am. ?[facility] f/u appt confirmed? Yes  Scheduled to see Dr Donzetta Matters  on 09/17/21 @ 220 pm. ?Are transportation arrangements needed? Yes  ?If their condition worsens, is the pt aware to call PCP or go to the Emergency Dept.? Yes ?Was the patient provided with contact information for the PCP's office or ED? Yes ?Was to pt encouraged to call back with questions or concerns? Yes ? ?On Sep 03, 2021, the patient was released from the  hospital and now needs help with transportation for his follow-up appointments. RN CM has submitted a referral for a ride on the patient's behalf. ? ?Lazaro Arms RN, BSN, [facility] ?Care Management Coordinator ?Transition of Care  ?Phone: [phone]  ?  ? ?

## 2021-09-05 NOTE — Telephone Encounter (Signed)
Rodney [name] calls nurse line requesting a hospice referral from PCP.  ? ?Rodney [name] reports PCP stated to call him when they were ready to move forward with this.  ? ?Will forward to PCP. ?

## 2021-09-08 ENCOUNTER — Telehealth: Payer: Self-pay

## 2021-09-08 ENCOUNTER — Ambulatory Visit: Payer: Medicare PPO

## 2021-09-08 NOTE — Telephone Encounter (Signed)
? ?  Telephone encounter was:  Successful.  ?09/08/2021 ?Name: Rodney [name] MRN: [id number] DOB: [birth year] ? ?Rodney [name] is a 71 y.o. year old male who is a primary care patient of Hensel, Jamal Collin, MD . The community resource team was consulted for assistance with Transportation Needs  ? ?Care guide performed the following interventions: Patient provided with information about care guide support team and interviewed to confirm resource needs.No transportation needed at this time transportation was needed for this mornings appointment ? ?Follow Up Plan:  No further follow up planned at this time. The patient has been provided with needed resources. ? ? ? ?Larena Sox ?Care Guide, [name] Care Coordination ?[facility], Care Management  ?[phone] ?[address], [place], [place] [address] ?Phone: [phone] ?Email: [email or web address] ? ?  ?

## 2021-09-09 ENCOUNTER — Telehealth: Payer: Self-pay

## 2021-09-09 NOTE — Telephone Encounter (Signed)
Rodney [name] home health nurse calls nurse line requesting PCP recommendation on POC INR checks.  ? ?Rodney [name] reports to her knowledge "no one is following this."  ? ?Rodney [name] froward to PCP for advisement.  ? ?Can give VO to start POC INR checks in the home with our management if appropriate.  ?

## 2021-09-11 ENCOUNTER — Encounter: Payer: Self-pay | Admitting: Family Medicine

## 2021-09-15 ENCOUNTER — Telehealth: Payer: Self-pay | Admitting: Family Medicine

## 2021-09-15 ENCOUNTER — Other Ambulatory Visit: Payer: Self-pay | Admitting: Family Medicine

## 2021-09-15 DIAGNOSIS — E1122 Type 2 diabetes mellitus with diabetic chronic kidney disease: Secondary | ICD-10-CM

## 2021-09-15 DIAGNOSIS — E1169 Type 2 diabetes mellitus with other specified complication: Secondary | ICD-10-CM

## 2021-09-15 NOTE — Telephone Encounter (Signed)
Called.  Went right to voice mail.  LM that I would call back when I know more.

## 2021-09-15 NOTE — Telephone Encounter (Signed)
Rodney [name] calling on behalf of this patient requesting Dr Rodney [name] to give her a call regarding his Hospice care. Please advise

## 2021-09-15 NOTE — Telephone Encounter (Signed)
[name] called back.  They declined hospice.  Per her description, the hospice involved was more restrictive and less helpful than the hospice agencies I am used to dealing with.  They are continuing with [facility], their home health agency.  We will do what we can to support them.

## 2021-09-15 NOTE — Telephone Encounter (Signed)
Will forward to MD.  Patient was supposed to be admitted with [facility] on 5-17 but wasn't. My contact with that agency is checking on it and will get back with me soon.  Isak Sotomayor,CMA

## 2021-09-16 NOTE — Telephone Encounter (Signed)
Sent message to [facility] home health letting them know that patient has declined the referral at this time.  Rodney [name],CMA

## 2021-09-17 ENCOUNTER — Encounter: Payer: Medicare PPO | Admitting: Vascular Surgery

## 2021-09-23 ENCOUNTER — Telehealth: Payer: Self-pay | Admitting: Vascular Surgery

## 2021-09-23 NOTE — Progress Notes (Deleted)
[facility] [place]                                     Cardiology Office Note:    Date:  09/23/2021   ID:  Rodney [name], DOB Jul 26, 1950, MRN [id number]  PCP:  [name], MD  [facility] Cardiologist:  Evalina Field, MD  [place] Electrophysiologist:  None   Referring MD: [name], MD   No chief complaint on file. ***  History of Present Illness:    Rodney [name] is a 71 y.o. male with a hx of CAD CTO of RCA, HFpEF, PAD s/p L BKA, HTN, HLD, severe AS, paroxsymal Afib/flutter, recurrent gangrene of the right foot who was seen 08/20/2021 as an inpatient TAVR consultation.   He has been followed by Dr. Audie Box as an outpatient. He has a history of PAD and underwent BKA in 09/2019. He developed atrial fibrillation postoperatively and underwent a TEE guided cardioversion to NSR however was unable to afford Eliquis and has not been on AC. Echo during his hospitalization showed an LVEF of 35 to 40% with moderate aortic stenosis. He underwent diagnostic cath which showed a CTO of RCA.  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 In post hospital follow up by 10/2019 he was noted to be back in A-fib without anticoagulation. He was started on Coumadin.    He was hospitalized 07/2020 with lower extremity blisters on the right side and gangrenous toes. Vascular surgery saw him for critical right lower extremity ischemia and he underwent a new superficial femoral artery to below-knee popliteal bypass. He was readmitted with swelling and blisters and found to have a DVT in the right lower extremity despite being on Coumadin. It was felt this may have been precipitated by interruption of anticoagulation for surgery. Again he was admitted 10/2020 with concerns for impending bypass graft failure. Vascular surgery was unable to redo vascularization and instead treated with stent of right SFA  femoropopliteal bypass along with ABx for osteomyelitis and underwent right great toe amputation.   Repeat echocardiogram 12/2020 showed LVEF of 50 to 55%, moderate aortic stenosis with mean gradient of 35 mmHg.   He was then admitted 07/20/2021 for acute hypoxic respiratory failure in the setting of COVID PNA, along with chronic diastolic CHF. It was noted his echocardiogram from 06/23/2021 indicated severe aortic stenosis with mean gradient of 43 mmHg. Plan was for outpatient structural heart clinic follow up to determine whether he would be a TAVR candidate. He was discharged on atorvastatin 80 mg daily, carvedilol 6.25 mg twice daily, Plavix 75 mg daily, Lasix 40 mg daily and Coumadin per pharmacy.   He then presented 08/19/21 with complaints of worsening shortness of breath found to be in acute on chronic CHF with acute respiratory failure. Functional capacity was felt to be poor at baseline as he is wheelchair bound with persistent vascular issues including  gangrenous toes. He was seen by VVS at which time he underwent an LE duplex that showed patent distal CFA artery and SFA to below the knee BPG graft stent. He underwent right second toe amputation per VVS. He was felt to be a marginal candidate for TAVR given significant co-morbidities including PAD, CKD stage IIIb, poor vascular access, and O2 dependent chronic lung disease. After further discussion with the multidisciplinary team,  he underwent LHC and pre TAVR imaging. R/LHC with known CTO of RCA and mid LAD 50% stenosis, prox to mid LCX at 60% with pulmonary HTN>>suspect medical management given no anginal symptoms. Orthopantogram 4/27 with multiple dental caries with plans for OP dental evaluation prior to TAVR to reduce risk of post-operative endocarditis. He states that he cannot afford to have his teeth pulled. CT from 08/22/21 with anatomy suitable for S3UR 53m valve. There was question about vascular access however in review with the team, he  seemed to have adequate [place] access for valve deployment. [facility] issues and poor home support.   Per chart review, it appears he is back home with his significant other who is having difficulty with transferring him for transport to his wheelchair.    He presents today for post hospital follow up. ***      ****Severe aortic stenosis: Initially found to have moderate aortic stenosis per echo 09/2019 which showed an LVEF at 35-40% with a mean gradient at 256mg and AVA by VTI at 1.29. By 05/2021 repeat echo showed significant progression of AS with AVA by VTI at 0.75, mean gradient 4343m, peak 62m27m and DVI at 0.22. Plan was to have him follow with OP Structural Heart team once he recovered from COVID PNA and recent vascular issues. He then presented 4/25 with complaints of worsening shortness of breath found to be in acute on chronic CHF with acute respiratory failure. Functional capacity is poor at baseline as he is wheelchair bound with persistent vascular issues including current gangrene right toe, seen by VVS. LE duplex performed 4/27 with patent distal CFA artery and SFA to below the knee BPG graft stent. Plan for right second toe amputation today. He is certainly not a SAVR candidate and may be at high risk for TAVR given significant co-morbidities including PAD, CKD stage IIIb, poor vascular access, and O2 dependent chronic lung disease. After further discussion with the multidisciplinary team, he underwent LHC and pre TAVR imaging. R/LHC with known CTO of RCA and mid LAD 50% stenosis, prox to mid LCX at 60% with pulmonary HTN>>suspect medical management given no anginal symptoms. Orthopantogram 4/27 with multiple dental caries>>will require OP dental evaluation prior to TAVR to reduce risk of post-operative endocarditis. CT from 08/22/21 with anatomy suitable for S3UR 29mm38mve. There was question about vascular access and in review with the team today,  appears that he has adequate [place] access for valve deployment. Dr. BartlCyndia Bentee today.    Severe COPD/acute on chronic respiratory failure: Initially required Bipap on ED presentation. Uses chronic supplemental home O2 2.5-3L over the last several months with recent hospitalizations for respiratory failure and COPD exacerbations. Continue nebs, steroids, and antibiotics per primary team. Consider PFTs prior to TAVR. MD to follow with final recommendations.    Severe peripheral arterial disease: 07/2020 seen by VVS for critical right lower extremity ischemia >>underwent superficial fem-pop bypass with readmission for postoperative DVT and was placed on Coumadin. By 10/2020 with impending bypass graft failure>>VVS unable to vascularize and he was instead treated with stent of right SFA femoropopliteal bypass and was also treated for osteomyelitis and is now s/p right great toe amputation and ultimate L BKA. Currently with a necrotic appearing lesion on his second toe. Most recent abdominal aortogram and peripheral angiogram reviewed by Dr. CoopeBurt Knack suggested borderline vascular access if TAVR pursued. Seen by VVS and underwent LE duplex  4/27 with patent distal CFA  artery and SFA to below the knee BPG graft stent. Plan for amputation today with Dr. Donzetta Matters. Will need to be [place] (not TF access) for TAVR.    Persistent Afib/flutter: Found to have post-operative AF after BKA 09/2019. Prior TEE cardioversion with brief return to NSR. Unable to afford Eliquis and was subsequently placed on Coumadin. Remains in persistent AF with rate control. Continue carvedilol. Coumadin stopped and IV Heparin started over the weekend in anticipation of R/LHC performed 08/25/21. Coumadin to resume tonight after amputation.     CAD: [facility] 08/25/21 with multivessel CAD with mid LAD lesion is 50% stenosed, ost RCA to Prox RCA lesion is 70% stenosed, mid RCA to dist RCA lesion is 100% stenosed, ost LAD to Prox LAD lesion is 20% stenosed, prox  Cx to Mid Cx lesion is 60% stenosed, and 1st Mrg lesion   Denies anginal symptoms. Continue current regimen.    Acute on chronic kidney disease stage IIIb: Creatinine at 1.36 today with a baseline at ~1.4.    Poor functional capacity: Wheelchair bound at this point given weakness and SOB. Unable to complete home health PT due to multiple hospitalizations. Now requires [facility] and other transportation services as he is unable to get into his [place]i due to weakness.    DM2: HbA1c, stable at 5.9. Plan SSI for glucose control if steroids continued.   HTN: Stable at 129/69> no changes today   Hypokalemia: 3.3 today, being replaced with IV supplementation.    TAVR plan: Discharge when medically stable. Will require OP follow up with dentist for possible extractions. Once he has recovered from this, will schedule for TAVR.   Past Medical History:  Diagnosis Date   Acute combined systolic and diastolic congestive heart failure (HCC)    Acute on chronic heart failure with preserved ejection fraction (HFpEF) (HCC)    Acute respiratory failure with hypoxia (HCC)    Acute respiratory failure with hypoxia (HCC)    Aortic stenosis    moderate in 2022   Atrial fibrillation (HCC)    CHF (congestive heart failure) (HCC)    Chronic pleural effusion, Left 11/16/2020   COPD exacerbation ([place]) 12/09/2020   Coronary artery disease    Demand ischemia ([place]) 03/26/2021   Diabetes mellitus without complication ([place])    HLD (hyperlipidemia)    Hypertension    Long term (current) use of anticoagulants 12/29/2019   Malnutrition of moderate degree 05/09/2021   Peripheral arterial disease ([place])    Pneumonia due to COVID-19 virus 05/29/2021    Past Surgical History:  Procedure Laterality Date   ABDOMINAL AORTOGRAM W/LOWER EXTREMITY N/A 08/05/2020   Procedure: ABDOMINAL AORTOGRAM W/LOWER EXTREMITY;  Surgeon: Marty Heck, MD;  Location: [place] CV LAB;  Service: Cardiovascular;  Laterality: N/A;    ABDOMINAL AORTOGRAM W/LOWER EXTREMITY N/A 11/13/2020   Procedure: ABDOMINAL AORTOGRAM W/LOWER EXTREMITY;  Surgeon: Cherre Robins, MD;  Location: [place] CV LAB;  Service: Cardiovascular;  Laterality: N/A;   ABDOMINAL AORTOGRAM W/LOWER EXTREMITY N/A 05/12/2021   Procedure: ABDOMINAL AORTOGRAM W/LOWER EXTREMITY;  Surgeon: Waynetta Sandy, MD;  Location: [place] CV LAB;  Service: Cardiovascular;  Laterality: N/A;   AMPUTATION Left 09/28/2019   Procedure: AMPUTATION BELOW KNEE;  Surgeon: Newt Minion, MD;  Location: [place];  Service: Orthopedics;  Laterality: Left;   AMPUTATION Right 11/15/2020   Procedure: RIGHT GREAT TOE AMPUTATION;  Surgeon: Newt Minion, MD;  Location: [place];  Service: Orthopedics;  Laterality: Right;   AMPUTATION Right 08/26/2021  Procedure: AMPUTATION 2nd TOE;  Surgeon: Waynetta Sandy, MD;  Location: [place];  Service: Vascular;  Laterality: Right;   CARDIAC CATHETERIZATION     CARDIOVERSION N/A 10/05/2019   Procedure: CARDIOVERSION;  Surgeon: Sanda Klein, MD;  Location: [place] ENDOSCOPY;  Service: Cardiovascular;  Laterality: N/A;   FEMORAL-POPLITEAL BYPASS GRAFT Right 08/07/2020   Procedure: RIGHT FEMORAL TO BELOW KNEE POPLITEAL ARTERY BYPASS;  Surgeon: Waynetta Sandy, MD;  Location: [place];  Service: Vascular;  Laterality: Right;   LEFT HEART CATH AND CORONARY ANGIOGRAPHY N/A 10/03/2019   Procedure: LEFT HEART CATH AND CORONARY ANGIOGRAPHY;  Surgeon: Lorretta Harp, MD;  Location: [place] CV [facility];  Service: Cardiovascular;  Laterality: N/A;   PERIPHERAL VASCULAR INTERVENTION Right 08/05/2020   Procedure: PERIPHERAL VASCULAR INTERVENTION;  Surgeon: Marty Heck, MD;  Location: [place] [facility];  Service: Cardiovascular;  Laterality: Right;  common Iliac   PERIPHERAL VASCULAR INTERVENTION Left 11/13/2020   Procedure: PERIPHERAL VASCULAR INTERVENTION;  Surgeon: Cherre Robins, MD;  Location: [place] CV LAB;  Service:  Cardiovascular;  Laterality: Left;   PERIPHERAL VASCULAR INTERVENTION Right 11/14/2020   Procedure: PERIPHERAL VASCULAR INTERVENTION;  Surgeon: Marty Heck, MD;  Location: [place] CV [facility];  Service: Cardiovascular;  Laterality: Right;  POP/SFA STENT   PERIPHERAL VASCULAR INTERVENTION  05/12/2021   Procedure: PERIPHERAL VASCULAR INTERVENTION;  Surgeon: Waynetta Sandy, MD;  Location: [place] CV LAB;  Service: Cardiovascular;;   RIGHT/LEFT HEART CATH AND CORONARY ANGIOGRAPHY N/A 08/25/2021   Procedure: RIGHT/LEFT HEART CATH AND CORONARY ANGIOGRAPHY;  Surgeon: Troy Sine, MD;  Location: [place] [facility] LAB;  Service: Cardiovascular;  Laterality: N/A;   TEE WITHOUT CARDIOVERSION N/A 10/05/2019   Procedure: TRANSESOPHAGEAL ECHOCARDIOGRAM (TEE);  Surgeon: Sanda Klein, MD;  Location: [facility] ENDOSCOPY;  Service: Cardiovascular;  Laterality: N/A;    Current Medications: No outpatient medications have been marked as taking for the 09/29/21 encounter (Appointment) with [facility].     Allergies:   Patient has no known allergies.   Social History   Socioeconomic History   Marital status: Widowed    Spouse name: Not on file   Number of children: Not on file   Years of education: Not on file   Highest education level: Not on file  Occupational History   Not on file  Tobacco Use   Smoking status: Former    Packs/day: 0.50    Years: 50.00    Pack years: 25.00    Types: Cigarettes    Start date: 04/09/2021   Smokeless tobacco: Never  Vaping Use   Vaping Use: Never used  Substance and Sexual Activity   Alcohol use: Yes    Alcohol/week: 6.0 standard drinks    Types: 6 Standard drinks or equivalent per week    Comment: 11/07/20 - states he has not drank in 6 months   Drug use: No   Sexual activity: Yes    Partners: Female    Comment: monagamous stable relationship  Other Topics Concern   Not on file  Social History Narrative   Not on file    Social Determinants of Health   Financial Resource Strain: Not on file  Food Insecurity: Not on file  Transportation Needs: Unmet Transportation Needs   Lack of Transportation (Medical): Yes   Lack of Transportation (Non-Medical): No  Physical Activity: Not on file  Stress: Not on file  Social Connections: Not on file     Family History: The patient's ***family  history includes Alcoholism in his father and mother.  ROS:   Please see the history of present illness.    All other systems reviewed and are negative.  EKGs/Labs/Other Studies Reviewed:    The following studies were reviewed today:    LHC: 08/25/2021     Mid LAD lesion is 50% stenosed.   Ost RCA to Prox RCA lesion is 70% stenosed.   Mid RCA to Dist RCA lesion is 100% stenosed.   Ost LAD to Prox LAD lesion is 20% stenosed.   Prox Cx to Mid Cx lesion is 60% stenosed.   1st Mrg lesion is 60% stenosed.   Hemodynamic findings consistent with moderate pulmonary hypertension.   There is moderate aortic valve stenosis.   Orthopantogram 08/21/21:   FINDINGS: Prominent dental caries are present within teeth 6, 7, 9, 10. Smaller caries are present in the remaining maxillary teeth. Modest caries are present teeth 18 and 19.   The mandible is intact and located. No other osseous abnormalities are present.   IMPRESSION: 1. Prominent dental caries within teeth 6, 7, 9, 10. 2. Modest caries involving teeth 18 and 19.   Vas Duplex 08/21/21: Summary:  Right: Patent stent with no evidence of stenosis in the Distal CFA artery  and SFA to below knee BPG graft stent.      LHC: 09/2019   IMPRESSION: Mr. Laura has mild nonobstructive disease in his left system including the AV groove circumflex and LAD with a chronically occluded RCA, faint grade 1 left to right collaterals and a 30 mm peak to peak gradient across his aortic valve.  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  His LVEDP was 35.  At this point, I would  recommend medical therapy.  The sheath was removed and a TR band was placed on the right wrist to achieve patent hemostasis.  Patient left lab in stable condition.  I have communicated these results to Dr. Audie Box .   Diagnostic Dominance: Right   Echo: 05/2021   1. Left ventricular ejection fraction, by estimation, is 55 to 60%. The  left ventricle has normal function. The left ventricle has no regional  wall motion abnormalities. There is mild concentric left ventricular  hypertrophy. Left ventricular diastolic  parameters are indeterminate.   2. Right ventricular systolic function is normal. The right ventricular  size is normal. There is mildly elevated pulmonary artery systolic  pressure.   3. Left atrial size was mildly dilated.   4. Right atrial size was mildly dilated.   5. The mitral valve is grossly normal. Mild mitral valve regurgitation.  Moderate mitral annular calcification.   6. The aortic valve is calcified. There is severe calcifcation of the  aortic valve. There is severe thickening of the aortic valve. Aortic valve  regurgitation is trivial. Severe aortic valve stenosis. Aortic valve area,  by VTI measures 0.75 cm. Aortic  valve mean gradient measures 43.0 mmHg. Aortic valve Vmax measures 4.30  m/s. DI 0.22   7. Aortic dilatation noted. There is borderline dilatation of the aortic  root, measuring 36 mm. There is mild dilatation of the ascending aorta,  measuring 38 mm.   8. The inferior vena cava is dilated in size with <50% respiratory  variability, suggesting right atrial pressure of 15 mmHg.   9. A small pericardial effusion is present. The pericardial effusion is  posterior and lateral to the left ventricle.   Comparison(s): Compared to prior TTE in 02/2021, there continues to be  severe aortic stenosis with increase in mean gradient from 23mHg on  previous study to 462mg on current study. EF appears slightly better on  current study from 50-55% to 55-60%.    FINDINGS   Left Ventricle: Left ventricular ejection fraction, by estimation, is 55  to 60%. The left ventricle has normal function. The left ventricle has no  regional wall motion abnormalities. The left ventricular internal cavity  size was normal in size. There is   mild concentric left ventricular hypertrophy. Left ventricular diastolic  parameters are indeterminate.   Right Ventricle: The right ventricular size is normal. No increase in  right ventricular wall thickness. Right ventricular systolic function is  normal. There is mildly elevated pulmonary artery systolic pressure. The  tricuspid regurgitant velocity is 2.94   m/s, and with an assumed right atrial pressure of 8 mmHg, the estimated  right ventricular systolic pressure is 4231.5mHg.   Left Atrium: Left atrial size was mildly dilated.   Right Atrium: Right atrial size was mildly dilated.   Pericardium: A small pericardial effusion is present. The pericardial  effusion is posterior and lateral to the left ventricle.   Mitral Valve: The mitral valve is grossly normal. There is mild thickening  of the mitral valve leaflet(s). There is mild calcification of the mitral  valve leaflet(s). Moderate mitral annular calcification. Mild mitral valve  regurgitation.   Tricuspid Valve: The tricuspid valve is normal in structure. Tricuspid  valve regurgitation is mild.   Aortic Valve: DI 0.23. The aortic valve is calcified. There is severe  calcifcation of the aortic valve. There is severe thickening of the aortic  valve. Aortic valve regurgitation is trivial. Severe aortic stenosis is  present. Aortic valve mean gradient  measures 43.0 mmHg. Aortic valve peak gradient measures 74.0 mmHg. Aortic  valve area, by VTI measures 0.82 cm.   Pulmonic Valve: The pulmonic valve was normal in structure. Pulmonic valve  regurgitation is trivial.   Aorta: Aortic dilatation noted. There is borderline dilatation of the  aortic root,  measuring 36 mm. There is mild dilatation of the ascending  aorta, measuring 38 mm.   Venous: The inferior vena cava is dilated in size with less than 50%  respiratory variability, suggesting right atrial pressure of 15 mmHg.   IAS/Shunts: The atrial septum is grossly normal.    Risk calculation:    Procedure: Isolated AVR Risk of Mortality: 4.254% Renal Failure: 10.317% Permanent Stroke: 0.773% Prolonged Ventilation: 17.009% DSW Infection: 0.323% Reoperation: 3.984% Morbidity or Mortality: 20.593% Short Length of Stay: 21.217% Long Length of Stay: 15.096%   [facility] Questionnaire      08/26/2021   10:00 AM  KCCQ-12  1 a. Ability to shower/bathe Quite a bit limited  1 b. Ability to walk 1 block Other, Did not do  1 c. Ability to hurry/jog Other, Did not do  2. Edema feet/ankles/legs 3+ times a week, not every day  3. Limited by fatigue Several times a day  4. Limited by dyspnea Several times a day  5. Sitting up / on 3+ pillows 1-2 times a week  6. Limited enjoyment of life Extremely limited  7. Rest of life w/ symptoms Mostly dissatisfied  8 a. Participation in hobbies Limited quite a bit  8 b. Participation in chores N/A, did not do for other reasons  8 c. Visiting family/friends N/A, did not do for other reasons        EKG:  EKG is *** ordered today.  The ekg ordered today demonstrates ***  Recent Labs: 06/21/2021: TSH 3.936 07/21/2021: ALT 30 08/19/2021: B Natriuretic Peptide 692.1 08/22/2021: Magnesium 1.7 08/29/2021: BUN 20; Creatinine, Ser 1.30; Potassium 3.8; Sodium 135 09/02/2021: Hemoglobin 8.5; Platelets 168  Recent Lipid Panel    Component Value Date/Time   CHOL 104 05/13/2021 0143   CHOL 140 02/21/2018 1650   TRIG 43 05/13/2021 0143   HDL 49 05/13/2021 0143   HDL 33 (L) 02/21/2018 1650   CHOLHDL 2.1 05/13/2021 0143   VLDL 9 05/13/2021 0143   LDLCALC 46 05/13/2021 0143   LDLCALC 79 02/21/2018 1650   LDLDIRECT 92 01/03/2014  1514     Risk Assessment/Calculations:   {Does this patient have ATRIAL FIBRILLATION?:[id number]}   Physical Exam:    VS:  There were no vitals taken for this visit.    Wt Readings from Last 3 Encounters:  09/03/21 194 lb 14.2 oz (88.4 kg)  07/28/21 220 lb 14.4 oz (100.2 kg)  07/17/21 222 lb (100.7 kg)     GEN: *** Well nourished, well developed in no acute distress HEENT: Normal NECK: No JVD; No carotid bruits LYMPHATICS: No lymphadenopathy CARDIAC: ***RRR, no murmurs, rubs, gallops RESPIRATORY:  Clear to auscultation without rales, wheezing or rhonchi  ABDOMEN: Soft, non-tender, non-distended MUSCULOSKELETAL:  No edema; No deformity  SKIN: Warm and dry NEUROLOGIC:  Alert and oriented x 3 PSYCHIATRIC:  Normal affect   ASSESSMENT:    No diagnosis found. PLAN:    In order of problems listed above:       {Are you ordering a CV Procedure (e.g. stress test, cath, DCCV, TEE, etc)?   Press F2        :[id number]}    Medication Adjustments/Labs and Tests Ordered: Current medicines are reviewed at length with the patient today.  Concerns regarding medicines are outlined above.  No orders of the defined types were placed in this encounter.  No orders of the defined types were placed in this encounter.   There are no Patient Instructions on file for this visit.   Signed, Kathyrn Drown, NP  09/23/2021 3:28 PM    [facility]

## 2021-09-23 NOTE — Telephone Encounter (Signed)
Pt SO called back states that she is having a hard time getting him to appts dues to his leg weakness and cant help her get himself in the wheel chair. I gave her resourses to the community to help her one being [facility] which is ambulance services that will come to the house load him on stretcher and bring him to and from appts due to his needs and the other is call your local fire department and ask for lift assist to put patient in the wheel chair get him out to curb side services for [facility] once he is back home she can call fire department back out to help get him back in house. She will call us back once she figures out how to get him into the office.

## 2021-09-25 ENCOUNTER — Telehealth: Payer: Self-pay | Admitting: *Deleted

## 2021-09-25 NOTE — Telephone Encounter (Signed)
Melanie, nurse with [facility] home health called stating patient was having transportation issues getting to office for suture removal.  She states some of the stitches are imbedded.  Per Threasa Beards, patient is being resistant to getting and keeping an appointment.  I explained to home health nurse the importance of patient being seen for follow up appointment to address these issues.  Threasa Beards states that she is working on securing transportation and will stress the importance of the appointment.

## 2021-09-29 ENCOUNTER — Ambulatory Visit: Payer: Medicare PPO

## 2021-09-30 ENCOUNTER — Telehealth: Payer: Self-pay

## 2021-09-30 NOTE — Telephone Encounter (Signed)
[place] OT calls nurse line requesting verbal orders for [facility] OT as follows.  1x a week for 8 weeks  Verbal order given.

## 2021-10-14 ENCOUNTER — Ambulatory Visit (INDEPENDENT_AMBULATORY_CARE_PROVIDER_SITE_OTHER): Payer: Medicare PPO | Admitting: Physician Assistant

## 2021-10-14 VITALS — BP 75/43 | HR 81 | Temp 97.7°F | Resp 18 | Ht 72.0 in | Wt 195.0 lb

## 2021-10-14 DIAGNOSIS — I739 Peripheral vascular disease, unspecified: Secondary | ICD-10-CM

## 2021-10-14 NOTE — Progress Notes (Signed)
POST OPERATIVE OFFICE NOTE    CC:  F/u for surgery  HPI:  This is a 71 y.o. 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  This was performed by Dr. Donzetta Matters on 08/26/2021.  Surgical history also significant for right common iliac artery stent on 08/05/2020 by Dr. Carlis Abbott.  He also had right SFA to below the knee popliteal artery bypass with vein on 08/07/2020 by Dr. Donzetta Matters.  He underwent angioplasty and stent placement in the bypass on 11/14/2020 by Dr. Carlis Abbott.  He had a right great toe amputation by Dr. Sharol Given on 11/15/2020.  Patient also has history of left below the knee amputation.  Most recent second toe potation was part of his preop work-up for TAVR.  He still needs to have his teeth pulled prior to proceeding with TAVR.  He believes second toe amputation site is well-healed at this point.  No Known Allergies  Current Outpatient Medications  Medication Sig Dispense Refill   acetaminophen (TYLENOL) 500 MG tablet Take 1,000 mg by mouth every 6 (six) hours as needed for mild pain.     albuterol (VENTOLIN HFA) 108 (90 Base) MCG/ACT inhaler INHALE 2 PUFFS INTO THE LUNGS EVERY 6 (SIX) HOURS AS NEEDED FOR WHEEZING OR SHORTNESS OF BREATH. 1 each 0   Blood Glucose Monitoring Suppl (TRUE METRIX METER) DEVI Use to test blood sugar three times daily. 1 each 0   Blood Glucose Monitoring Suppl (TRUE METRIX METER) w/Device KIT USE AS DIRECTED 1 kit 0   carvedilol (COREG) 12.5 MG tablet Take 6.25 mg by mouth 2 (two) times daily with a meal.     clopidogrel (PLAVIX) 75 MG tablet TAKE 1 TABLET EVERY DAY WITH BREAKFAST (Patient taking differently: Take 75 mg by mouth daily.) 90 tablet 3   fluticasone (FLONASE) 50 MCG/ACT nasal spray Place 2 sprays into both nostrils daily. 16 g 6   Fluticasone-Umeclidin-Vilant (TRELEGY ELLIPTA) 100-62.5-25 MCG/ACT AEPB Inhale 1 puff into the lungs daily.     furosemide (LASIX) 40 MG tablet TAKE 1 TABLET EVERY DAY 90 tablet 3   glucose blood (RELION TRUE METRIX TEST  STRIPS) test strip Use to test blood sugar three times per day. 300 each 3   insulin detemir (LEVEMIR) 100 UNIT/ML FlexPen Inject 15 Units into the skin daily. 15 mL 11   Insulin Pen Needle 32G X 4 MM MISC Use to inject insulin up to 4 times daily as directed, 100 each 0   ipratropium-albuterol (DUONEB) 0.5-2.5 (3) MG/3ML SOLN Take 3 mLs by nebulization every 4 (four) hours as needed. 300 mL 3   metFORMIN (GLUCOPHAGE) 1000 MG tablet TAKE 1 TABLET EVERY DAY 90 tablet 3   traZODone (DESYREL) 50 MG tablet Take 0.5-1 tablets (25-50 mg total) by mouth at bedtime as needed for sleep. 30 tablet 3   warfarin (COUMADIN) 2.5 MG tablet Take 1 tablet (2.5 mg total) by mouth daily. 30 tablet 0   atorvastatin (LIPITOR) 80 MG tablet Take 1 tablet (80 mg total) by mouth daily. 30 tablet 0   enoxaparin (LOVENOX) 100 MG/ML injection Inject 1 mL (100 mg total) into the skin 2 (two) times daily for 10 days. 20 mL 0   ferrous sulfate 325 (65 FE) MG tablet Take 1 tablet (325 mg total) by mouth every other day. (Patient not taking: Reported on 07/20/2021) 30 tablet 0   lactose free nutrition (BOOST) LIQD Take 237 mLs by mouth 3 (three) times a week. (Patient not taking: Reported on  08/19/2021)     losartan (COZAAR) 25 MG tablet Take 1/2 tablet (12.5 mg total) by mouth daily. 15 tablet 0   nicotine (NICODERM CQ - DOSED IN MG/24 HR) 7 mg/24hr patch Place 1 patch (7 mg total) onto the skin daily. (Patient not taking: Reported on 07/20/2021) 28 patch 0   pantoprazole (PROTONIX) 40 MG tablet Take 1 tablet (40 mg total) by mouth 2 (two) times daily. 60 tablet 0   TRUEplus Lancets 33G MISC Use to test blood sugar three times per day. (Patient not taking: Reported on 05/07/2021) 300 each 3   No current facility-administered medications for this visit.     ROS:  See HPI  Physical Exam:  Vitals:   10/14/21 1001  BP: (!) 75/43  Pulse: 81  Resp: 18  Temp: 97.7 F (36.5 C)  TempSrc: Temporal  SpO2: 93%  Weight: 195 lb (88.5  kg)  Height: 6' (1.829 m)    Incision: Well-healed second toe amputation site Extremities: Left BKA stump without wound  Assessment/Plan:  This is a 71 y.o. male who is s/p: Right second toe amputation  -Right second toe amputation site well-healed; sutures removed in office today -Ok to proceed with TAVR from vascular surgery standpoint -Recheck right iliac stent and right leg bypass duplex in 6 months -Patient will call/return office sooner with any questions or concerns   Dagoberto Ligas, PA-C [facility] [phone]  Clinic MD:  Carlis Abbott

## 2021-10-15 ENCOUNTER — Ambulatory Visit: Payer: Medicare PPO | Admitting: Cardiovascular Disease

## 2021-10-16 ENCOUNTER — Other Ambulatory Visit: Payer: Self-pay

## 2021-10-16 DIAGNOSIS — I739 Peripheral vascular disease, unspecified: Secondary | ICD-10-CM

## 2021-10-16 DIAGNOSIS — I350 Nonrheumatic aortic (valve) stenosis: Secondary | ICD-10-CM

## 2021-11-02 ENCOUNTER — Other Ambulatory Visit: Payer: Self-pay | Admitting: Family Medicine

## 2021-12-12 NOTE — Progress Notes (Deleted)
Cardiology Office Note:   Date:  12/12/2021  NAME:  Rodney [name]    MRN: [id number] DOB:  May 29, 1950   PCP:  [name], MD  Cardiologist:  Evalina Field, MD  Electrophysiologist:  None   Referring MD: [name], MD   No chief complaint on file. ***  History of Present Illness:   Rodney [name] is a 71 y.o. male with a hx of persistent atrial fibrillation, severe aortic stenosis, heart failure with reduced ejection fraction with recovery of EF, PAD, CKD stage III, CAD who presents for follow-up.  Admitted to the hospital in April 2023 with acute respiratory failure secondary COPD.  Found to have gangrene right second toe gangrene status post amputation.  Underwent evaluation for TAVR work-up.  Aortic stenosis has progressed.  Problem List 1. Systolic HF -53-29% 12/28/4266 -55-60% 06/23/2021 2. Atrial fibrillation, persistent -CHADSVASC = 5; CAD, HTN, DM, Age >38, CHF -TEE/DCCV 10/05/2019 -developed postop after L BKA -recurrence 11/03/2019 3. CAD -RCA CTO 4. Severe Aortic stenosis -05/2021 MG 43 mmHG 5. CKD III -GFR 45 6. DM -A1c 6.5 -T chol 91, HDL 20, LDL 48, TG 113 7. PAD -R iliac artery stent 08/05/2020 -R fem pop bypass 4/13/20222 -R great toe amputation 10/2020 -L BKA -R 2nd toe amputation 08/2021 8. Hyperkalemia 2/2 AKI 03/2020 -briefly on hemodialysis  9. Tobacco abuse -50 years   Past Medical 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   COPD exacerbation ([place]) 12/09/2020   Coronary artery disease    Demand ischemia ([place]) 03/26/2021    Diabetes mellitus without complication ([place])    HLD (hyperlipidemia)    Hypertension    Long term (current) use of anticoagulants 12/29/2019   Malnutrition of moderate degree 05/09/2021   Peripheral arterial disease ([place])    Pneumonia due to COVID-19 virus 05/29/2021    Past Surgical History: Past Surgical History:  Procedure Laterality Date   ABDOMINAL AORTOGRAM W/LOWER EXTREMITY N/A 08/05/2020   Procedure: ABDOMINAL AORTOGRAM W/LOWER EXTREMITY;  Surgeon: Marty Heck, MD;  Location: [place] [facility];  Service: Cardiovascular;  Laterality: N/A;   ABDOMINAL AORTOGRAM W/LOWER EXTREMITY N/A 11/13/2020   Procedure: ABDOMINAL AORTOGRAM W/LOWER EXTREMITY;  Surgeon: Cherre Robins, MD;  Location: [place] [facility];  Service: Cardiovascular;  Laterality: N/A;   ABDOMINAL AORTOGRAM W/LOWER EXTREMITY N/A 05/12/2021   Procedure: ABDOMINAL AORTOGRAM W/LOWER EXTREMITY;  Surgeon: Waynetta Sandy, MD;  Location: [place] [facility];  Service: Cardiovascular;  Laterality: N/A;   AMPUTATION Left 09/28/2019   Procedure: AMPUTATION BELOW KNEE;  Surgeon: Newt Minion, MD;  Location: [place];  Service: Orthopedics;  Laterality: Left;   AMPUTATION Right 11/15/2020   Procedure: RIGHT GREAT TOE AMPUTATION;  Surgeon: Newt Minion, MD;  Location: [place];  Service: Orthopedics;  Laterality: Right;   AMPUTATION Right 08/26/2021   Procedure: AMPUTATION 2nd TOE;  Surgeon: Waynetta Sandy, MD;  Location: [place];  Service: Vascular;  Laterality: Right;   CARDIAC CATHETERIZATION     CARDIOVERSION N/A 10/05/2019   Procedure: CARDIOVERSION;  Surgeon: Sanda Klein, MD;  Location: [facility];  Service: Cardiovascular;  Laterality: N/A;   FEMORAL-POPLITEAL BYPASS GRAFT Right 08/07/2020   Procedure: RIGHT FEMORAL TO BELOW KNEE POPLITEAL ARTERY BYPASS;  Surgeon: Waynetta Sandy, MD;  Location: [place];  Service: Vascular;  Laterality: Right;   LEFT HEART CATH AND CORONARY ANGIOGRAPHY N/A  10/03/2019   Procedure: LEFT HEART CATH AND CORONARY ANGIOGRAPHY;  Surgeon: Lorretta Harp, MD;  Location: [place] [facility];  Service: Cardiovascular;  Laterality: N/A;   PERIPHERAL VASCULAR INTERVENTION Right 08/05/2020   Procedure: PERIPHERAL VASCULAR INTERVENTION;  Surgeon: Marty Heck, MD;  Location: [place] [facility];  Service: Cardiovascular;  Laterality: Right;  common Iliac   PERIPHERAL VASCULAR INTERVENTION Left 11/13/2020   Procedure: PERIPHERAL VASCULAR INTERVENTION;  Surgeon: Cherre Robins, MD;  Location: [place] [facility];  Service: Cardiovascular;  Laterality: Left;   PERIPHERAL VASCULAR INTERVENTION Right 11/14/2020   Procedure: PERIPHERAL VASCULAR INTERVENTION;  Surgeon: Marty Heck, MD;  Location: [place] [facility];  Service: Cardiovascular;  Laterality: Right;  POP/SFA STENT   PERIPHERAL VASCULAR INTERVENTION  05/12/2021   Procedure: PERIPHERAL VASCULAR INTERVENTION;  Surgeon: Waynetta Sandy, MD;  Location: [place] [facility];  Service: Cardiovascular;;   RIGHT/LEFT HEART CATH AND CORONARY ANGIOGRAPHY N/A 08/25/2021   Procedure: RIGHT/LEFT HEART CATH AND CORONARY ANGIOGRAPHY;  Surgeon: Troy Sine, MD;  Location: [place] [facility];  Service: Cardiovascular;  Laterality: N/A;   TEE WITHOUT CARDIOVERSION N/A 10/05/2019   Procedure: TRANSESOPHAGEAL ECHOCARDIOGRAM (TEE);  Surgeon: Sanda Klein, MD;  Location: [facility]OSCOPY;  Service: Cardiovascular;  Laterality: N/A;    Current Medications: No outpatient medications have been marked as taking for the 12/16/21 encounter (Appointment) with O'Neal, Cassie Freer, MD.     Allergies:    Patient has no known allergies.   Social History: Social History   Socioeconomic History   Marital status: Widowed    Spouse name: Not on file   Number of children: Not on file   Years of education: Not on file   Highest education level: Not on file  Occupational History   Not on file  Tobacco Use    Smoking status: Former    Packs/day: 0.50    Years: 50.00    Total pack years: 25.00    Types: Cigarettes    Start date: 04/09/2021    Quit date: 07/24/2021    Years since quitting: 0.3   Smokeless tobacco: Never  Vaping Use   Vaping Use: Never used  Substance and Sexual Activity   Alcohol use: Yes    Alcohol/week: 6.0 standard drinks of alcohol    Types: 6 Standard drinks or equivalent per week    Comment: 11/07/20 - states he has not drank in 6 months   Drug use: No   Sexual activity: Yes    Partners: Female    Comment: monagamous stable relationship  Other Topics Concern   Not on file  Social History Narrative   Not on file   Social Determinants of Health   Financial Resource Strain: Not on file  Food Insecurity: Not on file  Transportation Needs: Unmet Transportation Needs (08/14/2021)   PRAPARE - Hydrologist (Medical): Yes    Lack of Transportation (Non-Medical): No  Physical Activity: Not on file  Stress: Not on file  Social Connections: Not on file     Family History: The patient's ***family history includes Alcoholism in his father and mother.  ROS:   All other ROS reviewed and negative. Pertinent positives noted in the HPI.     EKGs/Labs/Other Studies Reviewed:   The following studies were personally reviewed by me today:  EKG:  EKG is *** ordered today.  The ekg ordered today demonstrates ***, and was personally reviewed by me.   Recent Labs: 06/21/2021: TSH 3.936 07/21/2021: ALT 30 08/19/2021: B Natriuretic Peptide 692.1 08/22/2021: Magnesium 1.7 08/29/2021: BUN 20; Creatinine, Ser 1.30; Potassium 3.8; Sodium 135 09/02/2021: Hemoglobin 8.5; Platelets 168   Recent Lipid Panel    Component Value Date/Time   CHOL 104 05/13/2021 0143   CHOL 140 02/21/2018 1650   TRIG 43 05/13/2021 0143   HDL 49 05/13/2021 0143   HDL 33 (L) 02/21/2018 1650   CHOLHDL 2.1 05/13/2021 0143   VLDL 9 05/13/2021 0143   LDLCALC 46 05/13/2021 0143    LDLCALC 79 02/21/2018 1650   LDLDIRECT 92 01/03/2014 1514    Physical Exam:   VS:  There were no vitals taken for this visit.   Wt Readings from Last 3 Encounters:  10/14/21 195 lb (88.5 kg)  09/03/21 194 lb 14.2 oz (88.4 kg)  07/28/21 220 lb 14.4 oz (100.2 kg)    General: Well nourished, well developed, in no acute distress Head: Atraumatic, normal size  Eyes: PEERLA, EOMI  Neck: Supple, no JVD Endocrine: No thryomegaly Cardiac: Normal S1, S2; RRR; no murmurs, rubs, or gallops Lungs: Clear to auscultation bilaterally, no wheezing, rhonchi or rales  Abd: Soft, nontender, no hepatomegaly  Ext: No edema, pulses 2+ Musculoskeletal: No deformities, BUE and BLE strength normal and equal Skin: Warm and dry, no rashes   Neuro: Alert and oriented to person, place, time, and situation, CNII-XII grossly intact, no focal deficits  Psych: Normal mood and affect   ASSESSMENT:   CODYLEE PATIL is a 71 y.o. male who presents for the following: No diagnosis found.  PLAN:   There are no diagnoses linked to this encounter.  {Are you ordering a CV Procedure (e.g. stress test, cath, DCCV, TEE, etc)?   Press F2        :[id number]}  Disposition: No follow-ups on file.  Medication Adjustments/Labs and Tests Ordered: Current medicines are reviewed at length with the patient today.  Concerns regarding medicines are outlined above.  No orders of the defined types were placed in this encounter.  No orders of the defined types were placed in this encounter.   There are no Patient Instructions on file for this visit.   Time Spent with Patient: I have spent a total of *** minutes with patient reviewing hospital notes, telemetry, EKGs, labs and examining the patient as well as establishing an assessment and plan that was discussed with the patient.  > 50% of time was spent in direct patient care.  Signed, Addison Naegeli. Audie Box, MD, [place]  [address] [place], [place] [address] [phone]  12/12/2021 12:31 PM

## 2021-12-16 ENCOUNTER — Ambulatory Visit: Payer: Medicare PPO | Admitting: Cardiovascular Disease

## 2021-12-16 DIAGNOSIS — I10 Essential (primary) hypertension: Secondary | ICD-10-CM

## 2021-12-16 DIAGNOSIS — I5022 Chronic systolic (congestive) heart failure: Secondary | ICD-10-CM

## 2021-12-16 DIAGNOSIS — I350 Nonrheumatic aortic (valve) stenosis: Secondary | ICD-10-CM

## 2021-12-16 DIAGNOSIS — I4819 Other persistent atrial fibrillation: Secondary | ICD-10-CM

## 2021-12-18 ENCOUNTER — Ambulatory Visit: Payer: Medicare PPO | Admitting: Licensed Clinical Social Worker

## 2021-12-18 NOTE — Patient Outreach (Signed)
  Care Coordination   Initial Visit Note   12/18/2021 Name: Rodney [name] MRN: [id number] DOB: 1950/12/21  Rodney [name] is a 71 y.o. year old male who sees Hensel, Jamal Collin, MD for primary care. I spoke with  Rodney [name] by phone today  What matters to the patients health and wellness today?  Housing   Goals Addressed               This Visit's Progress     MSW Care Coordination (pt-stated)        Patient complains of rats due to property manager cutting down trees. Patient has placed an application for differen housing and awaiting approval.  SW completed SDOH screening. Patient receives Food Stamps and meal delivery.        SDOH assessments and interventions completed:  Yes  SDOH Interventions Today    Flowsheet Row Most Recent Value  SDOH Interventions   Housing Interventions Other (Comment)        Care Coordination Interventions Activated:  Yes  Care Coordination Interventions:  Yes, provided   Follow up plan: No further intervention required.   Encounter Outcome:  Pt. Visit Completed   Lenor Derrick , MSW Social Worker [facility] Care Management  [phone]

## 2021-12-18 NOTE — Patient Instructions (Signed)
Visit Information  Instructions:   Patient was given the following information about care management and care coordination services today, agreed to services, and gave verbal consent: 1.care management/care coordination services include personalized support from designated clinical staff supervised by their physician, including individualized plan of care and coordination with other care providers 2. 24/7 contact phone numbers for assistance for urgent and routine care needs. 3. The patient may stop care management/care coordination services at any time by phone call to the office staff.  Patient verbalizes understanding of instructions and care plan provided today and agrees to view in MyChart. Active MyChart status and patient understanding of how to access instructions and care plan via MyChart confirmed with patient.     No further follow up required: .  Felice Deem, BSW , MSW Social Worker [facility]  [phone]      

## 2022-01-11 NOTE — Progress Notes (Deleted)
Cardiology Clinic Note   Patient Name: Rodney [name] Date of Encounter: 01/12/2022  Primary Care Provider:  [name], MD Primary Cardiologist:  Evalina Field, MD  Patient Profile    Rodney [name] presents to the clinic today for follow-up evaluation of his severe aortic stenosis and systolic CHF.  Past Medical History    Past Medical History:  Diagnosis Date   Acute combined systolic and diastolic congestive heart failure (HCC)    Acute on chronic heart failure with preserved ejection fraction (HFpEF) (HCC)    Acute respiratory failure with hypoxia (HCC)    Acute respiratory failure with hypoxia (HCC)    Aortic stenosis    moderate in 2022   Atrial fibrillation (HCC)    CHF (congestive heart failure) (HCC)    Chronic pleural effusion, Left 11/16/2020   COPD exacerbation ([place]) 12/09/2020   Coronary artery disease    Demand ischemia ([place]) 03/26/2021   Diabetes mellitus without complication ([place])    HLD (hyperlipidemia)    Hypertension    Long term (current) use of anticoagulants 12/29/2019   Malnutrition of moderate degree 05/09/2021   Peripheral arterial disease ([place])    Pneumonia due to COVID-19 virus 05/29/2021   Past Surgical History:  Procedure Laterality Date   ABDOMINAL AORTOGRAM W/LOWER EXTREMITY N/A 08/05/2020   Procedure: ABDOMINAL AORTOGRAM W/LOWER EXTREMITY;  Surgeon: Marty Heck, MD;  Location: [facility];  Service: Cardiovascular;  Laterality: N/A;   ABDOMINAL AORTOGRAM W/LOWER EXTREMITY N/A 11/13/2020   Procedure: ABDOMINAL AORTOGRAM W/LOWER EXTREMITY;  Surgeon: Cherre Robins, MD;  Location: [facility];  Service: Cardiovascular;  Laterality: N/A;   ABDOMINAL AORTOGRAM W/LOWER EXTREMITY N/A 05/12/2021   Procedure: ABDOMINAL AORTOGRAM W/LOWER EXTREMITY;  Surgeon: Waynetta Sandy, MD;  Location: [facility];  Service: Cardiovascular;  Laterality: N/A;   AMPUTATION Left 09/28/2019   Procedure:  AMPUTATION BELOW KNEE;  Surgeon: Newt Minion, MD;  Location: [facility];  Service: Orthopedics;  Laterality: Left;   AMPUTATION Right 11/15/2020   Procedure: RIGHT GREAT TOE AMPUTATION;  Surgeon: Newt Minion, MD;  Location: [place];  Service: Orthopedics;  Laterality: Right;   AMPUTATION Right 08/26/2021   Procedure: AMPUTATION 2nd TOE;  Surgeon: Waynetta Sandy, MD;  Location: [place];  Service: Vascular;  Laterality: Right;   CARDIAC CATHETERIZATION     CARDIOVERSION N/A 10/05/2019   Procedure: CARDIOVERSION;  Surgeon: Sanda Klein, MD;  Location: [facility];  Service: Cardiovascular;  Laterality: N/A;   FEMORAL-POPLITEAL BYPASS GRAFT Right 08/07/2020   Procedure: RIGHT FEMORAL TO BELOW KNEE POPLITEAL ARTERY BYPASS;  Surgeon: Waynetta Sandy, MD;  Location: [facility];  Service: Vascular;  Laterality: Right;   LEFT HEART CATH AND CORONARY ANGIOGRAPHY N/A 10/03/2019   Procedure: LEFT HEART CATH AND CORONARY ANGIOGRAPHY;  Surgeon: Lorretta Harp, MD;  Location: [facility];  Service: Cardiovascular;  Laterality: N/A;   PERIPHERAL VASCULAR INTERVENTION Right 08/05/2020   Procedure: PERIPHERAL VASCULAR INTERVENTION;  Surgeon: Marty Heck, MD;  Location: [facility];  Service: Cardiovascular;  Laterality: Right;  common Iliac   PERIPHERAL VASCULAR INTERVENTION Left 11/13/2020   Procedure: PERIPHERAL VASCULAR INTERVENTION;  Surgeon: Cherre Robins, MD;  Location: [facility];  Service: Cardiovascular;  Laterality: Left;   PERIPHERAL VASCULAR INTERVENTION Right 11/14/2020   Procedure: PERIPHERAL VASCULAR INTERVENTION;  Surgeon: Marty Heck, MD;  Location: [facility];  Service: Cardiovascular;  Laterality: Right;  POP/SFA STENT   PERIPHERAL VASCULAR INTERVENTION  05/12/2021   Procedure: PERIPHERAL VASCULAR INTERVENTION;  Surgeon: Waynetta Sandy, MD;  Location: [facility];  Service: Cardiovascular;;   RIGHT/LEFT HEART CATH AND  CORONARY ANGIOGRAPHY N/A 08/25/2021   Procedure: RIGHT/LEFT HEART CATH AND CORONARY ANGIOGRAPHY;  Surgeon: Troy Sine, MD;  Location: [facility];  Service: Cardiovascular;  Laterality: N/A;   TEE WITHOUT CARDIOVERSION N/A 10/05/2019   Procedure: TRANSESOPHAGEAL ECHOCARDIOGRAM (TEE);  Surgeon: Sanda Klein, MD;  Location: [facility];  Service: Cardiovascular;  Laterality: N/A;    Allergies  No Known Allergies  History of Present Illness    Rodney [name] is a PMH of COPD, severe aortic stenosis, systolic CHF EF 59-16% (reduced ejection fraction with refractory EF, 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  After his BKA he developed paroxysmal atrial fibrillation. Echocardiogram showed cardiomyopathy with an ejection fraction of 35-40%.  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  He underwent TEE DCCV on 10/05/2019.  He was discharged on Eliquis but could not afford the medication.  He was seen as an outpatient 7/21.  He was back in atrial fibrillation and not on anticoagulation.  His amiodarone was stopped at that time.  There was a plan made to restart his anticoagulation and attempt another DCCV.   He was last seen by Kerin Ransom, PA-C on 02/15/2020.  He had been placed on warfarin and was being followed anticoagulation clinic.  He reported compliance with his medications.  He presented to talk about repeat DCCV.  However, he was noted to be in normal sinus rhythm.  He did not bring his medications with him.  His medication list indicated he was on amiodarone even though it has been discontinued.  He denied shortness of breath, palpitations.(!) 112/52His blood pressure was noted to be 90/50.   His wife contacted nurse triage line  on 02/15/2020 and indicated that she had reviewed  the medication list that was sent home.  She reported that it was up-to-date.   He is seen virtually 03/18/2020 in follow-up and stated he felt well.  He had been working with physical therapy 2 times a week for about 30-45 minutes.  He was walking with his wife and his prosthesis.  He stated that occasionally he had trouble with his fit when he did not keep his stump compression on.  He had been avoiding extra salt in his diet and his wife verified that they had been cooking most of their meals at home.  He did not notice any irregular or extra heartbeats.  However, he was cardiac unaware previously.  I will gave him a salty 6 diet sheet, had him continue to increase his physical activity as tolerated, and planned follow-up in 3 months.  He was admitted to the hospital on 08/19/2021 and discharged on 09/03/2021.  His admission diagnosis was chronic respiratory failure secondary to COPD.  Cardiology was consulted for severe aortic stenosis.  He was seen by Dr. Audie Box on 09/23/2021.  He was status post right second toe amputation.  He was doing well.  He underwent left heart cath on 08/25/2021.  It showed mid LAD stenosis 50%, ostial RCA-proximal RCA 70% stenosis, mid RCA-distal RCA 100% stenosed, ostial LAD-proximal  LAD 20% stenosis, proximal circumflex-mid circumflex 60% stenosis, first marginal 60% stenosis.  He was noted to have moderate pulmonary hypertension.  Moderate aortic stenosis was noted.  He has completed TAVR work-up.  Plan was made for structural heart team to coordinate TAVR as outpatient.  His EF recovered to 55-60%.  His furosemide was continued.  Losartan 12.5 mg was added back to his medication regimen.  Was felt he is not a great candidate for SGLT2 inhibitor due to his PAD and BKA.  He presents to the clinic today for follow-up evaluation and states***  *** denies chest pain, shortness of breath, lower extremity edema, fatigue,  palpitations, melena, hematuria, hemoptysis, diaphoresis, weakness, presyncope, syncope, orthopnea, and PND.  Aortic stenosis-underwent cardiac catheterization 08/25/2021 which showed 50% mid LAD stenosis, 70% RCA stenosis, 20% ostial-proximal LAD stenosis, 60% circumflex stenosis, 60% marginal lesion, moderate pulmonary hypertension, and moderate aortic valve stenosis.  Has completed work-up for TAVR. Follow-up with structural heart team  Systolic CHF-no increased DOE or activity intolerance.  EF recovered to 55-60%.  Weight stable.  Euvolemic.  Not a candidate for SGLT2 inhibitor due to BKA and PAD. Continue furosemide, carvedilol, losartan Heart healthy low-sodium diet-salty 6 given Maintain physical activity  Atrial fibrillation-heart rate today***.  Denies recent increase/acceleration in heart rate or bleeding issues.  Reports compliance with Coumadin. Continue carvedilol, Coumadin Avoid triggers caffeine, chocolate, EtOH, dehydration etc.  Peripheral arterial disease-he is status post BKA left and status post right second toe amputation. Continue Plavix, atorvastatin Heart healthy low-sodium diet Maintain physical activity  Disposition: Follow-up with Dr. Audie Box in 3-4 months.  Home Medications    Prior to Admission medications   Medication Sig Start Date End Date Taking? Authorizing Provider  acetaminophen (TYLENOL) 500 MG tablet Take 1,000 mg by mouth every 6 (six) hours as needed for mild pain.    [provider]  albuterol (VENTOLIN HFA) 108 (90 Base) MCG/ACT inhaler INHALE 2 PUFFS INTO THE LUNGS EVERY 6 (SIX) HOURS AS NEEDED FOR WHEEZING OR SHORTNESS OF BREATH. 04/24/21   [name], MD  atorvastatin (LIPITOR) 80 MG tablet Take 1 tablet (80 mg total) by mouth daily. 08/30/21 09/29/21  Merrily Brittle, DO  Blood Glucose Monitoring Suppl (TRUE METRIX METER) DEVI Use to test blood sugar three times daily. 11/20/19   [name], MD  Blood Glucose Monitoring Suppl  (TRUE METRIX METER) w/Device KIT USE AS DIRECTED 03/25/20   [name], MD  carvedilol (COREG) 12.5 MG tablet Take 6.25 mg by mouth 2 (two) times daily with a meal. 12/18/20   [provider]  clopidogrel (PLAVIX) 75 MG tablet TAKE 1 TABLET EVERY DAY WITH BREAKFAST Patient taking differently: Take 75 mg by mouth daily. 03/17/21   [name], MD  enoxaparin (LOVENOX) 100 MG/ML injection Inject 1 mL (100 mg total) into the skin 2 (two) times daily for 10 days. 08/30/21 09/09/21  Merrily Brittle, DO  ferrous sulfate 325 (65 FE) MG tablet Take 1 tablet (325 mg total) by mouth every other day. Patient not taking: Reported on 07/20/2021 05/18/21   Shary Key, DO  fluticasone [facility]) 50 MCG/ACT nasal spray Place 2 sprays into both nostrils daily. 02/13/21   Simmons-Robinson, [place], MD  Fluticasone-Umeclidin-Vilant (TRELEGY ELLIPTA) 100-62.5-25 MCG/ACT AEPB Inhale 1 puff into the lungs daily. 06/01/21   Eppie Gibson, MD  furosemide (LASIX) 40 MG tablet TAKE 1 TABLET EVERY DAY 09/01/21   [name], MD  glucose blood (RELION TRUE METRIX TEST  STRIPS) test strip Use to test blood sugar three times per day. 11/20/19   [name], MD  insulin detemir (LEVEMIR) 100 UNIT/ML FlexPen Inject 15 Units into the skin daily. 07/26/21   Erskine Emery, MD  Insulin Pen Needle 32G X 4 MM MISC Use to inject insulin up to 4 times daily as directed, 05/16/21     ipratropium-albuterol (DUONEB) 0.5-2.5 (3) MG/3ML SOLN Take 3 mLs by nebulization every 4 (four) hours as needed. 05/19/21   [name], MD  lactose free nutrition (BOOST) LIQD Take 237 mLs by mouth 3 (three) times a week. Patient not taking: Reported on 08/19/2021    [provider]  losartan (COZAAR) 25 MG tablet Take 1/2 tablet (12.5 mg total) by mouth daily. 08/30/21 09/29/21  Merrily Brittle, DO  metFORMIN (GLUCOPHAGE) 1000 MG tablet TAKE 1 TABLET EVERY DAY 09/15/21   [name], MD  nicotine (NICODERM CQ - DOSED  IN MG/24 HR) 7 mg/24hr patch Place 1 patch (7 mg total) onto the skin daily. Patient not taking: Reported on 07/20/2021 07/01/21   Shary Key, DO  pantoprazole (PROTONIX) 40 MG tablet Take 1 tablet by mouth twice daily 11/03/21   [name], MD  traZODone (DESYREL) 50 MG tablet Take 0.5-1 tablets (25-50 mg total) by mouth at bedtime as needed for sleep. 05/19/21   [name], MD  TRUEplus Lancets 33G MISC Use to test blood sugar three times per day. Patient not taking: Reported on 05/07/2021 11/20/19   [name], MD  warfarin (COUMADIN) 2.5 MG tablet Take 1 tablet (2.5 mg total) by mouth daily. 07/29/21 07/29/22  Eppie Gibson, MD    Family History    Family History  Problem Relation Age of Onset   Alcoholism Mother    Alcoholism Father    He indicated that his mother is deceased. He indicated that his father is deceased.  Social History    Social History   Socioeconomic History   Marital status: Widowed    Spouse name: Not on file   Number of children: Not on file   Years of education: Not on file   Highest education level: Not on file  Occupational History   Not on file  Tobacco Use   Smoking status: Former    Packs/day: 0.50    Years: 50.00    Total pack years: 25.00    Types: Cigarettes    Start date: 04/09/2021    Quit date: 07/24/2021    Years since quitting: 0.4   Smokeless tobacco: Never  Vaping Use   Vaping Use: Never used  Substance and Sexual Activity   Alcohol use: Yes    Alcohol/week: 6.0 standard drinks of alcohol    Types: 6 Standard drinks or equivalent per week    Comment: 11/07/20 - states he has not drank in 6 months   Drug use: No   Sexual activity: Yes    Partners: Female    Comment: monagamous stable relationship  Other Topics Concern   Not on file  Social History Narrative   Not on file   Social Determinants of Health   Financial Resource Strain: Not on file  Food Insecurity: No Food Insecurity (12/18/2021)   Hunger  Vital Sign    Worried About Running Out of Food in the Last Year: Never true    Ran Out of Food in the Last Year: Never true  Transportation Needs: Unmet Transportation Needs (08/14/2021)   PRAPARE - Transportation  Lack of Transportation (Medical): Yes    Lack of Transportation (Non-Medical): No  Physical Activity: Not on file  Stress: Not on file  Social Connections: Not on file  Intimate Partner Violence: Not on file     Review of Systems    General:  No chills, fever, night sweats or weight changes.  Cardiovascular:  No chest pain, dyspnea on exertion, edema, orthopnea, palpitations, paroxysmal nocturnal dyspnea. Dermatological: No rash, lesions/masses Respiratory: No cough, dyspnea Urologic: No hematuria, dysuria Abdominal:   No nausea, vomiting, diarrhea, bright red blood per rectum, melena, or hematemesis Neurologic:  No visual changes, wkns, changes in mental status. All other systems reviewed and are otherwise negative except as noted above.  Physical Exam    VS:  There were no vitals taken for this visit. , BMI There is no height or weight on file to calculate BMI. GEN: Well nourished, well developed, in no acute distress. HEENT: normal. Neck: Supple, no JVD, carotid bruits, or masses. Cardiac: RRR, no murmurs, rubs, or gallops. No clubbing, cyanosis, edema.  Radials/DP/PT 2+ and equal bilaterally.  Respiratory:  Respirations regular and unlabored, clear to auscultation bilaterally. GI: Soft, nontender, nondistended, BS + x 4. MS: no deformity or atrophy. Skin: warm and dry, no rash. Neuro:  Strength and sensation are intact. Psych: Normal affect.  Accessory Clinical Findings    Recent Labs: 06/21/2021: TSH 3.936 07/21/2021: ALT 30 08/19/2021: B Natriuretic Peptide 692.1 08/22/2021: Magnesium 1.7 08/29/2021: BUN 20; Creatinine, Ser 1.30; Potassium 3.8; Sodium 135 09/02/2021: Hemoglobin 8.5; Platelets 168   Recent Lipid Panel    Component Value Date/Time   CHOL  104 05/13/2021 0143   CHOL 140 02/21/2018 1650   TRIG 43 05/13/2021 0143   HDL 49 05/13/2021 0143   HDL 33 (L) 02/21/2018 1650   CHOLHDL 2.1 05/13/2021 0143   VLDL 9 05/13/2021 0143   LDLCALC 46 05/13/2021 0143   LDLCALC 79 02/21/2018 1650   LDLDIRECT 92 01/03/2014 1514    No BP recorded.  {Refresh Note OR Click here to enter BP  :1}***    ECG personally reviewed by me today- *** - No acute changes  Echocardiogram 06/23/2021 IMPRESSIONS     1. Left ventricular ejection fraction, by estimation, is 55 to 60%. The  left ventricle has normal function. The left ventricle has no regional  wall motion abnormalities. There is mild concentric left ventricular  hypertrophy. Left ventricular diastolic  parameters are indeterminate.   2. Right ventricular systolic function is normal. The right ventricular  size is normal. There is mildly elevated pulmonary artery systolic  pressure.   3. Left atrial size was mildly dilated.   4. Right atrial size was mildly dilated.   5. The mitral valve is grossly normal. Mild mitral valve regurgitation.  Moderate mitral annular calcification.   6. The aortic valve is calcified. There is severe calcifcation of the  aortic valve. There is severe thickening of the aortic valve. Aortic valve  regurgitation is trivial. Severe aortic valve stenosis. Aortic valve area,  by VTI measures 0.75 cm. Aortic  valve mean gradient measures 43.0 mmHg. Aortic valve Vmax measures 4.30  m/s. DI 0.22   7. Aortic dilatation noted. There is borderline dilatation of the aortic  root, measuring 36 mm. There is mild dilatation of the ascending aorta,  measuring 38 mm.   8. The inferior vena cava is dilated in size with <50% respiratory  variability, suggesting right atrial pressure of 15 mmHg.   9. A small pericardial  effusion is present. The pericardial effusion is  posterior and lateral to the left ventricle.   Comparison(s): Compared to prior TTE in 02/2021, there  continues to be  severe aortic stenosis with increase in mean gradient from 52mmHg on  previous study to 43mmHg on current study. EF appears slightly better on  current study from 50-55% to 55-60%.   Cardiac catheterization 08/25/2021  LHC 08/25/2021   Mid LAD lesion is 50% stenosed.   Ost RCA to Prox RCA lesion is 70% stenosed.   Mid RCA to Dist RCA lesion is 100% stenosed.   Ost LAD to Prox LAD lesion is 20% stenosed.   Prox Cx to Mid Cx lesion is 60% stenosed.   1st Mrg lesion is 60% stenosed.   Hemodynamic findings consistent with moderate pulmonary hypertension.   There is moderate aortic valve stenosis.   Assessment & Plan   1.  ***   Jossie Ng. Othar Curto NP-C     01/12/2022, 6:34 AM [address] Office [phone] Fax [phone]  Notice: This dictation was prepared with Dragon dictation along with smaller phrase technology. Any transcriptional errors that result from this process are unintentional and may not be corrected upon review.  I spent***minutes examining this patient, reviewing medications, and using patient centered shared decision making involving her cardiac care.  Prior to her visit I spent greater than 20 minutes reviewing her past medical history,  medications, and prior cardiac tests.

## 2022-01-13 ENCOUNTER — Ambulatory Visit: Payer: Medicare PPO | Admitting: General Practice

## 2022-02-01 NOTE — Progress Notes (Deleted)
Cardiology Clinic Note   Patient Name: Rodney [name] Date of Encounter: 02/01/2022  Primary Care Provider:  [name], MD Primary Cardiologist:  Evalina Field, MD  Patient Profile    [name] presents to the clinic today for follow-up evaluation of his severe aortic stenosis and systolic CHF.  Past Medical History    Past Medical History:  Diagnosis Date   Acute combined systolic and diastolic congestive heart failure (HCC)    Acute on chronic heart failure with preserved ejection fraction (HFpEF) (HCC)    Acute respiratory failure with hypoxia (HCC)    Acute respiratory failure with hypoxia (HCC)    Aortic stenosis    moderate in 2022   Atrial fibrillation (HCC)    CHF (congestive heart failure) (HCC)    Chronic pleural effusion, Left 11/16/2020   COPD exacerbation ([place]) 12/09/2020   Coronary artery disease    Demand ischemia ([place]) 03/26/2021   Diabetes mellitus without complication ([place])    HLD (hyperlipidemia)    Hypertension    Long term (current) use of anticoagulants 12/29/2019   Malnutrition of moderate degree 05/09/2021   Peripheral arterial disease ([place])    Pneumonia due to COVID-19 virus 05/29/2021   Past Surgical History:  Procedure Laterality Date   ABDOMINAL AORTOGRAM W/LOWER EXTREMITY N/A 08/05/2020   Procedure: ABDOMINAL AORTOGRAM W/LOWER EXTREMITY;  Surgeon: Marty Heck, MD;  Location: [facility];  Service: Cardiovascular;  Laterality: N/A;   ABDOMINAL AORTOGRAM W/LOWER EXTREMITY N/A 11/13/2020   Procedure: ABDOMINAL AORTOGRAM W/LOWER EXTREMITY;  Surgeon: Cherre Robins, MD;  Location: [place] [facility];  Service: Cardiovascular;  Laterality: N/A;   ABDOMINAL AORTOGRAM W/LOWER EXTREMITY N/A 05/12/2021   Procedure: ABDOMINAL AORTOGRAM W/LOWER EXTREMITY;  Surgeon: Waynetta Sandy, MD;  Location: [facility];  Service: Cardiovascular;  Laterality: N/A;   AMPUTATION Left 09/28/2019   Procedure:  AMPUTATION BELOW KNEE;  Surgeon: Newt Minion, MD;  Location: [place];  Service: Orthopedics;  Laterality: Left;   AMPUTATION Right 11/15/2020   Procedure: RIGHT GREAT TOE AMPUTATION;  Surgeon: Newt Minion, MD;  Location: [place];  Service: Orthopedics;  Laterality: Right;   AMPUTATION Right 08/26/2021   Procedure: AMPUTATION 2nd TOE;  Surgeon: Waynetta Sandy, MD;  Location: [place];  Service: Vascular;  Laterality: Right;   CARDIAC CATHETERIZATION     CARDIOVERSION N/A 10/05/2019   Procedure: CARDIOVERSION;  Surgeon: Sanda Klein, MD;  Location: [facility];  Service: Cardiovascular;  Laterality: N/A;   FEMORAL-POPLITEAL BYPASS GRAFT Right 08/07/2020   Procedure: RIGHT FEMORAL TO BELOW KNEE POPLITEAL ARTERY BYPASS;  Surgeon: Waynetta Sandy, MD;  Location: [place];  Service: Vascular;  Laterality: Right;   LEFT HEART CATH AND CORONARY ANGIOGRAPHY N/A 10/03/2019   Procedure: LEFT HEART CATH AND CORONARY ANGIOGRAPHY;  Surgeon: Lorretta Harp, MD;  Location: [facility];  Service: Cardiovascular;  Laterality: N/A;   PERIPHERAL VASCULAR INTERVENTION Right 08/05/2020   Procedure: PERIPHERAL VASCULAR INTERVENTION;  Surgeon: Marty Heck, MD;  Location: [place] [facility];  Service: Cardiovascular;  Laterality: Right;  common Iliac   PERIPHERAL VASCULAR INTERVENTION Left 11/13/2020   Procedure: PERIPHERAL VASCULAR INTERVENTION;  Surgeon: Cherre Robins, MD;  Location: [place] [facility];  Service: Cardiovascular;  Laterality: Left;   PERIPHERAL VASCULAR INTERVENTION Right 11/14/2020   Procedure: PERIPHERAL VASCULAR INTERVENTION;  Surgeon: Marty Heck, MD;  Location: [facility];  Service: Cardiovascular;  Laterality: Right;  POP/SFA STENT   PERIPHERAL VASCULAR INTERVENTION  05/12/2021   Procedure: PERIPHERAL VASCULAR INTERVENTION;  Surgeon: Waynetta Sandy, MD;  Location: [facility];  Service: Cardiovascular;;   RIGHT/LEFT HEART CATH AND  CORONARY ANGIOGRAPHY N/A 08/25/2021   Procedure: RIGHT/LEFT HEART CATH AND CORONARY ANGIOGRAPHY;  Surgeon: Troy Sine, MD;  Location: [facility];  Service: Cardiovascular;  Laterality: N/A;   TEE WITHOUT CARDIOVERSION N/A 10/05/2019   Procedure: TRANSESOPHAGEAL ECHOCARDIOGRAM (TEE);  Surgeon: Sanda Klein, MD;  Location: [facility];  Service: Cardiovascular;  Laterality: N/A;    Allergies  No Known Allergies  History of Present Illness    [name] is a PMH of COPD, severe aortic stenosis, systolic CHF EF 59-16% (reduced ejection fraction with refractory EF, 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  After his BKA he developed paroxysmal atrial fibrillation. Echocardiogram showed cardiomyopathy with an ejection fraction of 35-40%.  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  He underwent TEE DCCV on 10/05/2019.  He was discharged on Eliquis but could not afford the medication.  He was seen as an outpatient 7/21.  He was back in atrial fibrillation and not on anticoagulation.  His amiodarone was stopped at that time.  There was a plan made to restart his anticoagulation and attempt another DCCV.   He was last seen by Kerin Ransom, PA-C on 02/15/2020.  He had been placed on warfarin and was being followed anticoagulation clinic.  He reported compliance with his medications.  He presented to talk about repeat DCCV.  However, he was noted to be in normal sinus rhythm.  He did not bring his medications with him.  His medication list indicated he was on amiodarone even though it has been discontinued.  He denied shortness of breath, palpitations.(!) 112/52His blood pressure was noted to be 90/50.   His wife contacted nurse triage line  on 02/15/2020 and indicated that she had reviewed  the medication list that was sent home.  She reported that it was up-to-date.   He is seen virtually 03/18/2020 in follow-up and stated he felt well.  He had been working with physical therapy 2 times a week for about 30-45 minutes.  He was walking with his wife and his prosthesis.  He stated that occasionally he had trouble with his fit when he did not keep his stump compression on.  He had been avoiding extra salt in his diet and his wife verified that they had been cooking most of their meals at home.  He did not notice any irregular or extra heartbeats.  However, he was cardiac unaware previously.  I will gave him a salty 6 diet sheet, had him continue to increase his physical activity as tolerated, and planned follow-up in 3 months.  He was admitted to the hospital on 08/19/2021 and discharged on 09/03/2021.  His admission diagnosis was chronic respiratory failure secondary to COPD.  Cardiology was consulted for severe aortic stenosis.  He was seen by Dr. Audie Box on 09/23/2021.  He was status post right second toe amputation.  He was doing well.  He underwent left heart cath on 08/25/2021.  It showed mid LAD stenosis 50%, ostial RCA-proximal RCA 70% stenosis, mid RCA-distal RCA 100% stenosed, ostial LAD-proximal  LAD 20% stenosis, proximal circumflex-mid circumflex 60% stenosis, first marginal 60% stenosis.  He was noted to have moderate pulmonary hypertension.  Moderate aortic stenosis was noted.  He has completed TAVR work-up.  Plan was made for structural heart team to coordinate TAVR as outpatient.  His EF recovered to 55-60%.  His furosemide was continued.  Losartan 12.5 mg was added back to his medication regimen.  Was felt he is not a great candidate for SGLT2 inhibitor due to his PAD and BKA.  He presents to the clinic today for follow-up evaluation and states***  *** denies chest pain, shortness of breath, lower extremity edema, fatigue,  palpitations, melena, hematuria, hemoptysis, diaphoresis, weakness, presyncope, syncope, orthopnea, and PND.  Aortic stenosis-underwent cardiac catheterization 08/25/2021 which showed 50% mid LAD stenosis, 70% RCA stenosis, 20% ostial-proximal LAD stenosis, 60% circumflex stenosis, 60% marginal lesion, moderate pulmonary hypertension, and moderate aortic valve stenosis.  Has completed work-up for TAVR. Follow-up with structural heart team  Systolic CHF-no increased DOE or activity intolerance.  EF recovered to 55-60%.  Weight stable.  Euvolemic.  Not a candidate for SGLT2 inhibitor due to BKA and PAD. Continue furosemide, carvedilol, losartan Heart healthy low-sodium diet-salty 6 given Maintain physical activity  Atrial fibrillation-heart rate today***.  Denies recent increase/acceleration in heart rate or bleeding issues.  Reports compliance with Coumadin. Continue carvedilol, Coumadin Avoid triggers caffeine, chocolate, EtOH, dehydration etc.  Peripheral arterial disease-he is status post BKA left and status post right second toe amputation. Continue Plavix, atorvastatin Heart healthy low-sodium diet Maintain physical activity  Disposition: Follow-up with Dr. Audie Box in 3-4 months.  Home Medications    Prior to Admission medications   Medication Sig Start Date End Date Taking? Authorizing Provider  acetaminophen (TYLENOL) 500 MG tablet Take 1,000 mg by mouth every 6 (six) hours as needed for mild pain.    [provider]  albuterol (VENTOLIN HFA) 108 (90 Base) MCG/ACT inhaler INHALE 2 PUFFS INTO THE LUNGS EVERY 6 (SIX) HOURS AS NEEDED FOR WHEEZING OR SHORTNESS OF BREATH. 04/24/21   [name], MD  atorvastatin (LIPITOR) 80 MG tablet Take 1 tablet (80 mg total) by mouth daily. 08/30/21 09/29/21  Merrily Brittle, DO  Blood Glucose Monitoring Suppl (TRUE METRIX METER) DEVI Use to test blood sugar three times daily. 11/20/19   [name], MD  Blood Glucose Monitoring Suppl  (TRUE METRIX METER) w/Device KIT USE AS DIRECTED 03/25/20   [name], MD  carvedilol (COREG) 12.5 MG tablet Take 6.25 mg by mouth 2 (two) times daily with a meal. 12/18/20   [provider]  clopidogrel (PLAVIX) 75 MG tablet TAKE 1 TABLET EVERY DAY WITH BREAKFAST Patient taking differently: Take 75 mg by mouth daily. 03/17/21   [name], MD  enoxaparin (LOVENOX) 100 MG/ML injection Inject 1 mL (100 mg total) into the skin 2 (two) times daily for 10 days. 08/30/21 09/09/21  Merrily Brittle, DO  ferrous sulfate 325 (65 FE) MG tablet Take 1 tablet (325 mg total) by mouth every other day. Patient not taking: Reported on 07/20/2021 05/18/21   Shary Key, DO  fluticasone [facility]) 50 MCG/ACT nasal spray Place 2 sprays into both nostrils daily. 02/13/21   Simmons-Robinson, [place], MD  Fluticasone-Umeclidin-Vilant (TRELEGY ELLIPTA) 100-62.5-25 MCG/ACT AEPB Inhale 1 puff into the lungs daily. 06/01/21   Eppie Gibson, MD  furosemide (LASIX) 40 MG tablet TAKE 1 TABLET EVERY DAY 09/01/21   [name], MD  glucose blood (RELION TRUE METRIX TEST  STRIPS) test strip Use to test blood sugar three times per day. 11/20/19   [name], MD  insulin detemir (LEVEMIR) 100 UNIT/ML FlexPen Inject 15 Units into the skin daily. 07/26/21   Erskine Emery, MD  Insulin Pen Needle 32G X 4 MM MISC Use to inject insulin up to 4 times daily as directed, 05/16/21     ipratropium-albuterol (DUONEB) 0.5-2.5 (3) MG/3ML SOLN Take 3 mLs by nebulization every 4 (four) hours as needed. 05/19/21   [name], MD  lactose free nutrition (BOOST) LIQD Take 237 mLs by mouth 3 (three) times a week. Patient not taking: Reported on 08/19/2021    [provider]  losartan (COZAAR) 25 MG tablet Take 1/2 tablet (12.5 mg total) by mouth daily. 08/30/21 09/29/21  Merrily Brittle, DO  metFORMIN (GLUCOPHAGE) 1000 MG tablet TAKE 1 TABLET EVERY DAY 09/15/21   [name], MD  nicotine (NICODERM CQ - DOSED  IN MG/24 HR) 7 mg/24hr patch Place 1 patch (7 mg total) onto the skin daily. Patient not taking: Reported on 07/20/2021 07/01/21   Shary Key, DO  pantoprazole (PROTONIX) 40 MG tablet Take 1 tablet by mouth twice daily 11/03/21   [name], MD  traZODone (DESYREL) 50 MG tablet Take 0.5-1 tablets (25-50 mg total) by mouth at bedtime as needed for sleep. 05/19/21   [name], MD  TRUEplus Lancets 33G MISC Use to test blood sugar three times per day. Patient not taking: Reported on 05/07/2021 11/20/19   [name], MD  warfarin (COUMADIN) 2.5 MG tablet Take 1 tablet (2.5 mg total) by mouth daily. 07/29/21 07/29/22  Eppie Gibson, MD    Family History    Family History  Problem Relation Age of Onset   Alcoholism Mother    Alcoholism Father    He indicated that his mother is deceased. He indicated that his father is deceased.  Social History    Social History   Socioeconomic History   Marital status: Widowed    Spouse name: Not on file   Number of children: Not on file   Years of education: Not on file   Highest education level: Not on file  Occupational History   Not on file  Tobacco Use   Smoking status: Former    Packs/day: 0.50    Years: 50.00    Total pack years: 25.00    Types: Cigarettes    Start date: 04/09/2021    Quit date: 07/24/2021    Years since quitting: 0.5   Smokeless tobacco: Never  Vaping Use   Vaping Use: Never used  Substance and Sexual Activity   Alcohol use: Yes    Alcohol/week: 6.0 standard drinks of alcohol    Types: 6 Standard drinks or equivalent per week    Comment: 11/07/20 - states he has not drank in 6 months   Drug use: No   Sexual activity: Yes    Partners: Female    Comment: monagamous stable relationship  Other Topics Concern   Not on file  Social History Narrative   Not on file   Social Determinants of Health   Financial Resource Strain: Not on file  Food Insecurity: No Food Insecurity (12/18/2021)   Hunger  Vital Sign    Worried About Running Out of Food in the Last Year: Never true    Ran Out of Food in the Last Year: Never true  Transportation Needs: Unmet Transportation Needs (08/14/2021)   PRAPARE - Transportation  Lack of Transportation (Medical): Yes    Lack of Transportation (Non-Medical): No  Physical Activity: Not on file  Stress: Not on file  Social Connections: Not on file  Intimate Partner Violence: Not on file     Review of Systems    General:  No chills, fever, night sweats or weight changes.  Cardiovascular:  No chest pain, dyspnea on exertion, edema, orthopnea, palpitations, paroxysmal nocturnal dyspnea. Dermatological: No rash, lesions/masses Respiratory: No cough, dyspnea Urologic: No hematuria, dysuria Abdominal:   No nausea, vomiting, diarrhea, bright red blood per rectum, melena, or hematemesis Neurologic:  No visual changes, wkns, changes in mental status. All other systems reviewed and are otherwise negative except as noted above.  Physical Exam    VS:  There were no vitals taken for this visit. , BMI There is no height or weight on file to calculate BMI. GEN: Well nourished, well developed, in no acute distress. HEENT: normal. Neck: Supple, no JVD, carotid bruits, or masses. Cardiac: RRR, no murmurs, rubs, or gallops. No clubbing, cyanosis, edema.  Radials/DP/PT 2+ and equal bilaterally.  Respiratory:  Respirations regular and unlabored, clear to auscultation bilaterally. GI: Soft, nontender, nondistended, BS + x 4. MS: no deformity or atrophy. Skin: warm and dry, no rash. Neuro:  Strength and sensation are intact. Psych: Normal affect.  Accessory Clinical Findings    Recent Labs: 06/21/2021: TSH 3.936 07/21/2021: ALT 30 08/19/2021: B Natriuretic Peptide 692.1 08/22/2021: Magnesium 1.7 08/29/2021: BUN 20; Creatinine, Ser 1.30; Potassium 3.8; Sodium 135 09/02/2021: Hemoglobin 8.5; Platelets 168   Recent Lipid Panel    Component Value Date/Time   CHOL  104 05/13/2021 0143   CHOL 140 02/21/2018 1650   TRIG 43 05/13/2021 0143   HDL 49 05/13/2021 0143   HDL 33 (L) 02/21/2018 1650   CHOLHDL 2.1 05/13/2021 0143   VLDL 9 05/13/2021 0143   LDLCALC 46 05/13/2021 0143   LDLCALC 79 02/21/2018 1650   LDLDIRECT 92 01/03/2014 1514    No BP recorded.  {Refresh Note OR Click here to enter BP  :1}***    ECG personally reviewed by me today- *** - No acute changes  Echocardiogram 06/23/2021 IMPRESSIONS     1. Left ventricular ejection fraction, by estimation, is 55 to 60%. The  left ventricle has normal function. The left ventricle has no regional  wall motion abnormalities. There is mild concentric left ventricular  hypertrophy. Left ventricular diastolic  parameters are indeterminate.   2. Right ventricular systolic function is normal. The right ventricular  size is normal. There is mildly elevated pulmonary artery systolic  pressure.   3. Left atrial size was mildly dilated.   4. Right atrial size was mildly dilated.   5. The mitral valve is grossly normal. Mild mitral valve regurgitation.  Moderate mitral annular calcification.   6. The aortic valve is calcified. There is severe calcifcation of the  aortic valve. There is severe thickening of the aortic valve. Aortic valve  regurgitation is trivial. Severe aortic valve stenosis. Aortic valve area,  by VTI measures 0.75 cm. Aortic  valve mean gradient measures 43.0 mmHg. Aortic valve Vmax measures 4.30  m/s. DI 0.22   7. Aortic dilatation noted. There is borderline dilatation of the aortic  root, measuring 36 mm. There is mild dilatation of the ascending aorta,  measuring 38 mm.   8. The inferior vena cava is dilated in size with <50% respiratory  variability, suggesting right atrial pressure of 15 mmHg.   9. A small pericardial  effusion is present. The pericardial effusion is  posterior and lateral to the left ventricle.   Comparison(s): Compared to prior TTE in 02/2021, there  continues to be  severe aortic stenosis with increase in mean gradient from 73mHg on  previous study to 439mg on current study. EF appears slightly better on  current study from 50-55% to 55-60%.   Cardiac catheterization 08/25/2021  LHC 08/25/2021   Mid LAD lesion is 50% stenosed.   Ost RCA to Prox RCA lesion is 70% stenosed.   Mid RCA to Dist RCA lesion is 100% stenosed.   Ost LAD to Prox LAD lesion is 20% stenosed.   Prox Cx to Mid Cx lesion is 60% stenosed.   1st Mrg lesion is 60% stenosed.   Hemodynamic findings consistent with moderate pulmonary hypertension.   There is moderate aortic valve stenosis.   Assessment & Plan   1.  ***   JeJossie NgCleaver NP-C     02/01/2022, 3:49 PM [email or web address] ([phone] ([phone]: This dictation was prepared with Dragon dictation along with smaller phrase technology. Any transcriptional errors that result from this process are unintentional and may not be corrected upon review.  I spent***minutes examining this patient, reviewing medications, and using patient centered shared decision making involving her cardiac care.  Prior to her visit I spent greater than 20 minutes reviewing her past medical history,  medications, and prior cardiac tests.

## 2022-02-03 ENCOUNTER — Ambulatory Visit: Payer: Medicare PPO | Attending: Cardiovascular Disease | Admitting: General Practice

## 2022-02-12 ENCOUNTER — Inpatient Hospital Stay (HOSPITAL_COMMUNITY): Payer: Medicare PPO

## 2022-02-12 ENCOUNTER — Emergency Department (HOSPITAL_COMMUNITY): Payer: Medicare PPO

## 2022-02-12 ENCOUNTER — Inpatient Hospital Stay (HOSPITAL_COMMUNITY)
Admission: EM | Admit: 2022-02-12 | Discharge: 2022-02-17 | DRG: 917 | Disposition: A | Discharge status: Home / Self Care | Payer: Medicare PPO | Attending: Internal Medicine | Admitting: Internal Medicine

## 2022-02-12 ENCOUNTER — Encounter (HOSPITAL_COMMUNITY): Payer: Self-pay | Admitting: Emergency Medicine

## 2022-02-12 DIAGNOSIS — J189 Pneumonia, unspecified organism: Secondary | ICD-10-CM | POA: Diagnosis present

## 2022-02-12 DIAGNOSIS — J9690 Respiratory failure, unspecified, unspecified whether with hypoxia or hypercapnia: Secondary | ICD-10-CM | POA: Diagnosis present

## 2022-02-12 DIAGNOSIS — T405X1A Poisoning by cocaine, accidental (unintentional), initial encounter: Principal | ICD-10-CM | POA: Diagnosis present

## 2022-02-12 DIAGNOSIS — R4020 Unspecified coma: Secondary | ICD-10-CM | POA: Diagnosis not present

## 2022-02-12 DIAGNOSIS — R0609 Other forms of dyspnea: Secondary | ICD-10-CM | POA: Diagnosis not present

## 2022-02-12 DIAGNOSIS — Z794 Long term (current) use of insulin: Secondary | ICD-10-CM

## 2022-02-12 DIAGNOSIS — Z87891 Personal history of nicotine dependence: Secondary | ICD-10-CM

## 2022-02-12 DIAGNOSIS — J9602 Acute respiratory failure with hypercapnia: Secondary | ICD-10-CM | POA: Diagnosis present

## 2022-02-12 DIAGNOSIS — Z8701 Personal history of pneumonia (recurrent): Secondary | ICD-10-CM

## 2022-02-12 DIAGNOSIS — J441 Chronic obstructive pulmonary disease with (acute) exacerbation: Secondary | ICD-10-CM | POA: Diagnosis present

## 2022-02-12 DIAGNOSIS — I2510 Atherosclerotic heart disease of native coronary artery without angina pectoris: Secondary | ICD-10-CM | POA: Diagnosis present

## 2022-02-12 DIAGNOSIS — E785 Hyperlipidemia, unspecified: Secondary | ICD-10-CM | POA: Diagnosis present

## 2022-02-12 DIAGNOSIS — E1165 Type 2 diabetes mellitus with hyperglycemia: Secondary | ICD-10-CM | POA: Diagnosis present

## 2022-02-12 DIAGNOSIS — J90 Pleural effusion, not elsewhere classified: Secondary | ICD-10-CM

## 2022-02-12 DIAGNOSIS — E1122 Type 2 diabetes mellitus with diabetic chronic kidney disease: Secondary | ICD-10-CM | POA: Diagnosis present

## 2022-02-12 DIAGNOSIS — J9621 Acute and chronic respiratory failure with hypoxia: Secondary | ICD-10-CM | POA: Diagnosis present

## 2022-02-12 DIAGNOSIS — N1832 Chronic kidney disease, stage 3b: Secondary | ICD-10-CM | POA: Diagnosis present

## 2022-02-12 DIAGNOSIS — E861 Hypovolemia: Secondary | ICD-10-CM | POA: Diagnosis not present

## 2022-02-12 DIAGNOSIS — Z993 Dependence on wheelchair: Secondary | ICD-10-CM

## 2022-02-12 DIAGNOSIS — N179 Acute kidney failure, unspecified: Secondary | ICD-10-CM | POA: Diagnosis not present

## 2022-02-12 DIAGNOSIS — I6523 Occlusion and stenosis of bilateral carotid arteries: Secondary | ICD-10-CM | POA: Diagnosis not present

## 2022-02-12 DIAGNOSIS — R069 Unspecified abnormalities of breathing: Secondary | ICD-10-CM | POA: Diagnosis not present

## 2022-02-12 DIAGNOSIS — E669 Obesity, unspecified: Secondary | ICD-10-CM | POA: Diagnosis present

## 2022-02-12 DIAGNOSIS — Z8616 Personal history of COVID-19: Secondary | ICD-10-CM | POA: Diagnosis not present

## 2022-02-12 DIAGNOSIS — Z7984 Long term (current) use of oral hypoglycemic drugs: Secondary | ICD-10-CM

## 2022-02-12 DIAGNOSIS — R55 Syncope and collapse: Secondary | ICD-10-CM | POA: Diagnosis not present

## 2022-02-12 DIAGNOSIS — J440 Chronic obstructive pulmonary disease with acute lower respiratory infection: Secondary | ICD-10-CM | POA: Diagnosis present

## 2022-02-12 DIAGNOSIS — J9601 Acute respiratory failure with hypoxia: Secondary | ICD-10-CM

## 2022-02-12 DIAGNOSIS — Z811 Family history of alcohol abuse and dependence: Secondary | ICD-10-CM

## 2022-02-12 DIAGNOSIS — D631 Anemia in chronic kidney disease: Secondary | ICD-10-CM | POA: Diagnosis present

## 2022-02-12 DIAGNOSIS — R0602 Shortness of breath: Secondary | ICD-10-CM | POA: Diagnosis present

## 2022-02-12 DIAGNOSIS — I672 Cerebral atherosclerosis: Secondary | ICD-10-CM | POA: Diagnosis not present

## 2022-02-12 DIAGNOSIS — Z66 Do not resuscitate: Secondary | ICD-10-CM | POA: Diagnosis present

## 2022-02-12 DIAGNOSIS — Z7902 Long term (current) use of antithrombotics/antiplatelets: Secondary | ICD-10-CM

## 2022-02-12 DIAGNOSIS — Z89512 Acquired absence of left leg below knee: Secondary | ICD-10-CM | POA: Diagnosis not present

## 2022-02-12 DIAGNOSIS — I4819 Other persistent atrial fibrillation: Secondary | ICD-10-CM | POA: Diagnosis present

## 2022-02-12 DIAGNOSIS — I480 Paroxysmal atrial fibrillation: Secondary | ICD-10-CM

## 2022-02-12 DIAGNOSIS — Z1152 Encounter for screening for COVID-19: Secondary | ICD-10-CM | POA: Diagnosis not present

## 2022-02-12 DIAGNOSIS — Z79899 Other long term (current) drug therapy: Secondary | ICD-10-CM

## 2022-02-12 DIAGNOSIS — E1151 Type 2 diabetes mellitus with diabetic peripheral angiopathy without gangrene: Secondary | ICD-10-CM | POA: Diagnosis present

## 2022-02-12 DIAGNOSIS — I350 Nonrheumatic aortic (valve) stenosis: Secondary | ICD-10-CM | POA: Diagnosis not present

## 2022-02-12 DIAGNOSIS — J810 Acute pulmonary edema: Secondary | ICD-10-CM | POA: Diagnosis not present

## 2022-02-12 DIAGNOSIS — S0990XA Unspecified injury of head, initial encounter: Secondary | ICD-10-CM | POA: Diagnosis not present

## 2022-02-12 DIAGNOSIS — R791 Abnormal coagulation profile: Secondary | ICD-10-CM | POA: Diagnosis not present

## 2022-02-12 DIAGNOSIS — I5043 Acute on chronic combined systolic (congestive) and diastolic (congestive) heart failure: Secondary | ICD-10-CM | POA: Diagnosis present

## 2022-02-12 DIAGNOSIS — I5033 Acute on chronic diastolic (congestive) heart failure: Secondary | ICD-10-CM | POA: Diagnosis not present

## 2022-02-12 DIAGNOSIS — Z6827 Body mass index (BMI) 27.0-27.9, adult: Secondary | ICD-10-CM

## 2022-02-12 DIAGNOSIS — Z91199 Patient's noncompliance with other medical treatment and regimen due to unspecified reason: Secondary | ICD-10-CM

## 2022-02-12 DIAGNOSIS — I130 Hypertensive heart and chronic kidney disease with heart failure and stage 1 through stage 4 chronic kidney disease, or unspecified chronic kidney disease: Secondary | ICD-10-CM | POA: Diagnosis present

## 2022-02-12 DIAGNOSIS — Z7951 Long term (current) use of inhaled steroids: Secondary | ICD-10-CM

## 2022-02-12 DIAGNOSIS — Z7901 Long term (current) use of anticoagulants: Secondary | ICD-10-CM

## 2022-02-12 DIAGNOSIS — Z6372 Alcoholism and drug addiction in family: Secondary | ICD-10-CM

## 2022-02-12 LAB — I-STAT CHEM 8, ED
BUN: 36 mg/dL — ABNORMAL HIGH (ref 8–23)
Calcium, Ion: 1.19 mmol/L (ref 1.15–1.40)
Chloride: 100 mmol/L (ref 98–111)
Creatinine, Ser: 1.9 mg/dL — ABNORMAL HIGH (ref 0.61–1.24)
Glucose, Bld: 249 mg/dL — ABNORMAL HIGH (ref 70–99)
HCT: 34 % — ABNORMAL LOW (ref 39.0–52.0)
Hemoglobin: 11.6 g/dL — ABNORMAL LOW (ref 13.0–17.0)
Potassium: 5.1 mmol/L (ref 3.5–5.1)
Sodium: 138 mmol/L (ref 135–145)
TCO2: 33 mmol/L — ABNORMAL HIGH (ref 22–32)

## 2022-02-12 LAB — CBC WITH DIFFERENTIAL/PLATELET
Abs Immature Granulocytes: 0.11 10*3/uL — ABNORMAL HIGH (ref 0.00–0.07)
Basophils Absolute: 0.1 10*3/uL (ref 0.0–0.1)
Basophils Relative: 1 %
Eosinophils Absolute: 0.7 10*3/uL — ABNORMAL HIGH (ref 0.0–0.5)
Eosinophils Relative: 4 %
HCT: 37.7 % — ABNORMAL LOW (ref 39.0–52.0)
Hemoglobin: 11.7 g/dL — ABNORMAL LOW (ref 13.0–17.0)
Immature Granulocytes: 1 %
Lymphocytes Relative: 26 %
Lymphs Abs: 4.3 10*3/uL — ABNORMAL HIGH (ref 0.7–4.0)
MCH: 29.9 pg (ref 26.0–34.0)
MCHC: 31 g/dL (ref 30.0–36.0)
MCV: 96.4 fL (ref 80.0–100.0)
Monocytes Absolute: 1.1 10*3/uL — ABNORMAL HIGH (ref 0.1–1.0)
Monocytes Relative: 6 %
Neutro Abs: 10.6 10*3/uL — ABNORMAL HIGH (ref 1.7–7.7)
Neutrophils Relative %: 62 %
Platelets: 325 10*3/uL (ref 150–400)
RBC: 3.91 MIL/uL — ABNORMAL LOW (ref 4.22–5.81)
RDW: 13 % (ref 11.5–15.5)
WBC: 16.9 10*3/uL — ABNORMAL HIGH (ref 4.0–10.5)
nRBC: 0 % (ref 0.0–0.2)

## 2022-02-12 LAB — URINALYSIS, ROUTINE W REFLEX MICROSCOPIC
Bilirubin Urine: NEGATIVE
Glucose, UA: NEGATIVE mg/dL
Ketones, ur: NEGATIVE mg/dL
Nitrite: NEGATIVE
Protein, ur: 30 mg/dL — AB
Specific Gravity, Urine: 1.01 (ref 1.005–1.030)
pH: 5 (ref 5.0–8.0)

## 2022-02-12 LAB — I-STAT ARTERIAL BLOOD GAS, ED
Acid-Base Excess: 0 mmol/L (ref 0.0–2.0)
Acid-Base Excess: 3 mmol/L — ABNORMAL HIGH (ref 0.0–2.0)
Bicarbonate: 32.7 mmol/L — ABNORMAL HIGH (ref 20.0–28.0)
Bicarbonate: 28.7 mmol/L — ABNORMAL HIGH (ref 20.0–28.0)
Calcium, Ion: 1.3 mmol/L (ref 1.15–1.40)
Calcium, Ion: 1.2 mmol/L (ref 1.15–1.40)
HCT: 33 % — ABNORMAL LOW (ref 39.0–52.0)
HCT: 27 % — ABNORMAL LOW (ref 39.0–52.0)
Hemoglobin: 11.2 g/dL — ABNORMAL LOW (ref 13.0–17.0)
Hemoglobin: 9.2 g/dL — ABNORMAL LOW (ref 13.0–17.0)
O2 Saturation: 100 %
O2 Saturation: 97 %
Patient temperature: 98.6
Potassium: 5.1 mmol/L (ref 3.5–5.1)
Potassium: 4.9 mmol/L (ref 3.5–5.1)
Sodium: 138 mmol/L (ref 135–145)
Sodium: 137 mmol/L (ref 135–145)
TCO2: 36 mmol/L — ABNORMAL HIGH (ref 22–32)
TCO2: 30 mmol/L (ref 22–32)
pCO2 arterial: 109.6 mmHg (ref 32–48)
pCO2 arterial: 50.1 mmHg — ABNORMAL HIGH (ref 32–48)
pH, Arterial: 7.084 — CL (ref 7.35–7.45)
pH, Arterial: 7.366 (ref 7.35–7.45)
pO2, Arterial: 504 mmHg — ABNORMAL HIGH (ref 83–108)
pO2, Arterial: 96 mmHg (ref 83–108)

## 2022-02-12 LAB — COMPREHENSIVE METABOLIC PANEL
ALT: 11 U/L (ref 0–44)
AST: 16 U/L (ref 15–41)
Albumin: 3.4 g/dL — ABNORMAL LOW (ref 3.5–5.0)
Alkaline Phosphatase: 72 U/L (ref 38–126)
Anion gap: 12 (ref 5–15)
BUN: 30 mg/dL — ABNORMAL HIGH (ref 8–23)
CO2: 27 mmol/L (ref 22–32)
Calcium: 9 mg/dL (ref 8.9–10.3)
Chloride: 99 mmol/L (ref 98–111)
Creatinine, Ser: 1.94 mg/dL — ABNORMAL HIGH (ref 0.61–1.24)
GFR, Estimated: 37 mL/min — ABNORMAL LOW (ref >60)
Glucose, Bld: 245 mg/dL — ABNORMAL HIGH (ref 70–99)
Potassium: 5 mmol/L (ref 3.5–5.1)
Sodium: 138 mmol/L (ref 135–145)
Total Bilirubin: 0.3 mg/dL (ref 0.3–1.2)
Total Protein: 7.6 g/dL (ref 6.5–8.1)

## 2022-02-12 LAB — I-STAT VENOUS BLOOD GAS, ED
Acid-base deficit: 1 mmol/L (ref 0.0–2.0)
Bicarbonate: 30.6 mmol/L — ABNORMAL HIGH (ref 20.0–28.0)
Calcium, Ion: 1.23 mmol/L (ref 1.15–1.40)
HCT: 34 % — ABNORMAL LOW (ref 39.0–52.0)
Hemoglobin: 11.6 g/dL — ABNORMAL LOW (ref 13.0–17.0)
O2 Saturation: 98 %
Potassium: 5 mmol/L (ref 3.5–5.1)
Sodium: 138 mmol/L (ref 135–145)
TCO2: 34 mmol/L — ABNORMAL HIGH (ref 22–32)
pCO2, Ven: 96.1 mmHg (ref 44–60)
pH, Ven: 7.111 — CL (ref 7.25–7.43)
pO2, Ven: 140 mmHg — ABNORMAL HIGH (ref 32–45)

## 2022-02-12 LAB — PROTIME-INR
INR: 1.3 — ABNORMAL HIGH (ref 0.8–1.2)
Prothrombin Time: 15.7 seconds — ABNORMAL HIGH (ref 11.4–15.2)

## 2022-02-12 LAB — RESP PANEL BY RT-PCR (FLU A&B, COVID) ARPGX2
Influenza A by PCR: NEGATIVE
Influenza B by PCR: NEGATIVE
SARS Coronavirus 2 by RT PCR: NEGATIVE

## 2022-02-12 LAB — CBG MONITORING, ED: Glucose-Capillary: 218 mg/dL — ABNORMAL HIGH (ref 70–99)

## 2022-02-12 LAB — ECHOCARDIOGRAM COMPLETE
AR max vel: 0.72 cm2
AV Area VTI: 0.67 cm2
AV Area mean vel: 0.64 cm2
AV Mean grad: 29 mmHg
AV Peak grad: 44.6 mmHg
Ao pk vel: 3.34 m/s
Area-P 1/2: 2.95 cm2
Calc EF: 48.9 %
Height: 70 in
MV M vel: 5.32 m/s
MV Peak grad: 113.2 mmHg
MV VTI: 1.5 cm2
Radius: 0.4 cm
S' Lateral: 4.3 cm
Single Plane A2C EF: 56.3 %
Single Plane A4C EF: 48.1 %
Weight: 3121.71 oz

## 2022-02-12 LAB — GLUCOSE, CAPILLARY
Glucose-Capillary: 195 mg/dL — ABNORMAL HIGH (ref 70–99)
Glucose-Capillary: 161 mg/dL — ABNORMAL HIGH (ref 70–99)
Glucose-Capillary: 180 mg/dL — ABNORMAL HIGH (ref 70–99)

## 2022-02-12 LAB — RAPID URINE DRUG SCREEN, HOSP PERFORMED
Amphetamines: NOT DETECTED
Barbiturates: NOT DETECTED
Benzodiazepines: NOT DETECTED
Cocaine: POSITIVE — AB
Opiates: NOT DETECTED
Tetrahydrocannabinol: POSITIVE — AB

## 2022-02-12 LAB — TROPONIN I (HIGH SENSITIVITY)
Troponin I (High Sensitivity): 49 ng/L — ABNORMAL HIGH (ref <18)
Troponin I (High Sensitivity): 135 ng/L (ref <18)

## 2022-02-12 LAB — BRAIN NATRIURETIC PEPTIDE: B Natriuretic Peptide: 475.3 pg/mL — ABNORMAL HIGH (ref 0.0–100.0)

## 2022-02-12 LAB — MRSA NEXT GEN BY PCR, NASAL: MRSA by PCR Next Gen: DETECTED — AB

## 2022-02-12 MED ORDER — METHYLPREDNISOLONE SODIUM SUCC 125 MG IJ SOLR
80.0000 mg | Freq: Every day | INTRAMUSCULAR | Status: DC
  Administered 2022-02-13: 80 mg via INTRAVENOUS
  Filled 2022-02-12: qty 2

## 2022-02-12 MED ORDER — PROPOFOL 1000 MG/100ML IV EMUL
INTRAVENOUS | Status: AC
  Filled 2022-02-12: qty 100

## 2022-02-12 MED ORDER — METHYLPREDNISOLONE SODIUM SUCC 125 MG IJ SOLR
125.0000 mg | Freq: Once | INTRAMUSCULAR | Status: AC
  Administered 2022-02-12: 125 mg via INTRAVENOUS
  Filled 2022-02-12: qty 2

## 2022-02-12 MED ORDER — NALOXONE HCL 2 MG/2ML IJ SOSY
PREFILLED_SYRINGE | INTRAMUSCULAR | Status: AC
  Administered 2022-02-12: 2 mg
  Filled 2022-02-12: qty 2

## 2022-02-12 MED ORDER — PROPOFOL 1000 MG/100ML IV EMUL: 5.0000 ug/kg/min | INTRAVENOUS | Status: DC

## 2022-02-12 MED ORDER — ETOMIDATE 2 MG/ML IV SOLN
INTRAVENOUS | Status: DC | PRN
  Administered 2022-02-12: 20 mg via INTRAVENOUS

## 2022-02-12 MED ORDER — ROCURONIUM BROMIDE 50 MG/5ML IV SOLN
INTRAVENOUS | Status: DC | PRN
  Administered 2022-02-12: 100 mg via INTRAVENOUS

## 2022-02-12 MED ORDER — SODIUM CHLORIDE 0.9 % IV SOLN
500.0000 mg | Freq: Every day | INTRAVENOUS | Status: DC
  Administered 2022-02-13: 500 mg via INTRAVENOUS
  Filled 2022-02-12: qty 5

## 2022-02-12 MED ORDER — CHLORHEXIDINE GLUCONATE CLOTH 2 % EX PADS
6.0000 | MEDICATED_PAD | Freq: Every day | CUTANEOUS | Status: DC
  Administered 2022-02-13 – 2022-02-17 (×5): 6 via TOPICAL

## 2022-02-12 MED ORDER — SODIUM CHLORIDE 0.9 % IV SOLN
2.0000 g | INTRAVENOUS | Status: DC
  Administered 2022-02-12 – 2022-02-16 (×5): 2 g via INTRAVENOUS
  Filled 2022-02-12 (×5): qty 20

## 2022-02-12 MED ORDER — INSULIN ASPART 100 UNIT/ML IJ SOLN
0.0000 [IU] | INTRAMUSCULAR | Status: DC
  Administered 2022-02-12 (×3): 3 [IU] via SUBCUTANEOUS
  Administered 2022-02-12: 5 [IU] via SUBCUTANEOUS
  Administered 2022-02-13 (×3): 3 [IU] via SUBCUTANEOUS

## 2022-02-12 MED ORDER — ORAL CARE MOUTH RINSE
15.0000 mL | OROMUCOSAL | Status: DC
  Administered 2022-02-12 – 2022-02-13 (×13): 15 mL via OROMUCOSAL

## 2022-02-12 MED ORDER — SODIUM CHLORIDE 0.9 % IV SOLN: INTRAVENOUS | Status: DC | PRN

## 2022-02-12 MED ORDER — MUPIROCIN 2 % EX OINT
1.0000 | TOPICAL_OINTMENT | Freq: Two times a day (BID) | CUTANEOUS | Status: AC
  Administered 2022-02-12 – 2022-02-16 (×10): 1 via NASAL
  Filled 2022-02-12 (×4): qty 22

## 2022-02-12 MED ORDER — FENTANYL 2500MCG IN NS 250ML (10MCG/ML) PREMIX INFUSION
0.0000 ug/h | INTRAVENOUS | Status: DC
  Administered 2022-02-12: 25 ug/h via INTRAVENOUS
  Filled 2022-02-12: qty 250

## 2022-02-12 MED ORDER — FUROSEMIDE 10 MG/ML IJ SOLN
40.0000 mg | Freq: Once | INTRAMUSCULAR | Status: AC
  Administered 2022-02-12: 40 mg via INTRAVENOUS
  Filled 2022-02-12: qty 4

## 2022-02-12 MED ORDER — SODIUM CHLORIDE 0.9 % IV SOLN
500.0000 mg | Freq: Once | INTRAVENOUS | Status: AC
  Administered 2022-02-12: 500 mg via INTRAVENOUS
  Filled 2022-02-12: qty 5

## 2022-02-12 MED ORDER — IPRATROPIUM-ALBUTEROL 0.5-2.5 (3) MG/3ML IN SOLN
3.0000 mL | Freq: Four times a day (QID) | RESPIRATORY_TRACT | Status: DC
  Administered 2022-02-12 – 2022-02-13 (×5): 3 mL via RESPIRATORY_TRACT
  Filled 2022-02-12 (×5): qty 3

## 2022-02-12 MED ORDER — VITAL HIGH PROTEIN PO LIQD: 1000.0000 mL | ORAL | Status: DC

## 2022-02-12 MED ORDER — INSULIN DETEMIR 100 UNIT/ML ~~LOC~~ SOLN
10.0000 [IU] | Freq: Every day | SUBCUTANEOUS | Status: DC
  Administered 2022-02-12 – 2022-02-16 (×5): 10 [IU] via SUBCUTANEOUS
  Filled 2022-02-12 (×5): qty 0.1

## 2022-02-12 MED ORDER — FENTANYL BOLUS VIA INFUSION
25.0000 ug | INTRAVENOUS | Status: DC | PRN
  Administered 2022-02-12: 50 ug via INTRAVENOUS

## 2022-02-12 MED ORDER — PANTOPRAZOLE SODIUM 40 MG PO TBEC: 40.0000 mg | DELAYED_RELEASE_TABLET | Freq: Every day | ORAL | Status: DC

## 2022-02-12 MED ORDER — FENTANYL CITRATE PF 50 MCG/ML IJ SOSY: 25.0000 ug | PREFILLED_SYRINGE | Freq: Once | INTRAMUSCULAR | Status: DC

## 2022-02-12 MED ORDER — CLOPIDOGREL BISULFATE 75 MG PO TABS
75.0000 mg | ORAL_TABLET | Freq: Every day | ORAL | Status: DC
  Administered 2022-02-12: 75 mg
  Filled 2022-02-12: qty 1

## 2022-02-12 MED ORDER — DOCUSATE SODIUM 100 MG PO CAPS: 100.0000 mg | ORAL_CAPSULE | Freq: Two times a day (BID) | ORAL | Status: DC | PRN

## 2022-02-12 MED ORDER — PEPTAMEN 1.5 CAL PO LIQD
1000.0000 mL | ORAL | Status: DC
  Administered 2022-02-12: 1000 mL
  Filled 2022-02-12: qty 1000

## 2022-02-12 MED ORDER — ORAL CARE MOUTH RINSE: 15.0000 mL | OROMUCOSAL | Status: DC | PRN

## 2022-02-12 MED ORDER — FENTANYL 2500MCG IN NS 250ML (10MCG/ML) PREMIX INFUSION
25.0000 ug/h | INTRAVENOUS | Status: DC
  Administered 2022-02-12: 50 ug/h via INTRAVENOUS

## 2022-02-12 MED ORDER — POLYETHYLENE GLYCOL 3350 17 G PO PACK
17.0000 g | PACK | Freq: Every day | ORAL | Status: DC
  Administered 2022-02-12: 17 g
  Filled 2022-02-12: qty 1

## 2022-02-12 MED ORDER — DOCUSATE SODIUM 50 MG/5ML PO LIQD
100.0000 mg | Freq: Two times a day (BID) | ORAL | Status: DC
  Administered 2022-02-12 (×2): 100 mg
  Filled 2022-02-12 (×2): qty 10

## 2022-02-12 MED ORDER — PANTOPRAZOLE 2 MG/ML SUSPENSION
40.0000 mg | Freq: Every day | ORAL | Status: DC
  Administered 2022-02-12: 40 mg
  Filled 2022-02-12 (×4): qty 20

## 2022-02-12 MED ORDER — ENOXAPARIN SODIUM 40 MG/0.4ML IJ SOSY
40.0000 mg | PREFILLED_SYRINGE | Freq: Every day | INTRAMUSCULAR | Status: DC
  Administered 2022-02-12 – 2022-02-13 (×2): 40 mg via SUBCUTANEOUS
  Filled 2022-02-12 (×2): qty 0.4

## 2022-02-12 MED ORDER — POLYETHYLENE GLYCOL 3350 17 G PO PACK: 17.0000 g | PACK | Freq: Every day | ORAL | Status: DC | PRN

## 2022-02-12 MED ORDER — SODIUM CHLORIDE 0.9 % IV SOLN
1.0000 g | Freq: Once | INTRAVENOUS | Status: AC
  Administered 2022-02-12: 1 g via INTRAVENOUS
  Filled 2022-02-12: qty 10

## 2022-02-12 MED ORDER — PROPOFOL 1000 MG/100ML IV EMUL
0.0000 ug/kg/min | INTRAVENOUS | Status: DC
  Administered 2022-02-12 – 2022-02-13 (×2): 5 ug/kg/min via INTRAVENOUS
  Filled 2022-02-12: qty 100

## 2022-02-12 NOTE — ED Provider Notes (Signed)
[facility] EMERGENCY DEPARTMENT Provider Note   CSN: [id number] Arrival date & time: 02/12/22  0455     History  Chief Complaint  Patient presents with   Shortness of Breath    Rodney [name] is a 71 y.o. male.  The history is provided by the EMS personnel. The history is limited by the condition of the patient.  Shortness of Breath Severity:  Severe Onset quality:  Sudden Duration:  1 hour Timing:  Constant Progression:  Unchanged Chronicity:  Recurrent Relieved by:  Nothing Worsened by:  Nothing Ineffective treatments:  None tried Associated symptoms: wheezing   Associated symptoms: no fever   Risk factors: obesity and tobacco use   Patient with a history of COPD, CHF, CAD and PVD presents with sudden onset SOB.  Patient reported asked EMS for CPAP and then became minimally responsive.       Home Medications Prior to Admission medications   Medication Sig Start Date End Date Taking? Authorizing Provider  acetaminophen (TYLENOL) 500 MG tablet Take 1,000 mg by mouth every 6 (six) hours as needed for mild pain.    [provider]  albuterol (VENTOLIN HFA) 108 (90 Base) MCG/ACT inhaler INHALE 2 PUFFS INTO THE LUNGS EVERY 6 (SIX) HOURS AS NEEDED FOR WHEEZING OR SHORTNESS OF BREATH. 04/24/21   [name], MD  atorvastatin (LIPITOR) 80 MG tablet Take 1 tablet (80 mg total) by mouth daily. 08/30/21 09/29/21  Merrily Brittle, DO  Blood Glucose Monitoring Suppl (TRUE METRIX METER) DEVI Use to test blood sugar three times daily. 11/20/19   [name], MD  Blood Glucose Monitoring Suppl (TRUE METRIX METER) w/Device KIT USE AS DIRECTED 03/25/20   [name], MD  carvedilol (COREG) 12.5 MG tablet Take 6.25 mg by mouth 2 (two) times daily with a meal. 12/18/20   [provider]  clopidogrel (PLAVIX) 75 MG tablet TAKE 1 TABLET EVERY DAY WITH BREAKFAST Patient taking differently: Take 75 mg by mouth daily. 03/17/21   [name], MD  enoxaparin (LOVENOX) 100 MG/ML injection Inject 1 mL (100 mg total) into the skin 2 (two) times daily for 10 days. 08/30/21 09/09/21  Merrily Brittle, DO  ferrous sulfate 325 (65 FE) MG tablet Take 1 tablet (325 mg total) by mouth every other day. Patient not taking: Reported on 07/20/2021 05/18/21   Shary Key, DO  fluticasone [facility]) 50 MCG/ACT nasal spray Place 2 sprays into both nostrils daily. 02/13/21   Simmons-Robinson, Makiera, MD  Fluticasone-Umeclidin-Vilant (TRELEGY ELLIPTA) 100-62.5-25 MCG/ACT AEPB Inhale 1 puff into the lungs daily. 06/01/21   Eppie Gibson, MD  furosemide (LASIX) 40 MG tablet TAKE 1 TABLET EVERY DAY 09/01/21   [name], MD  glucose blood (RELION TRUE METRIX TEST STRIPS) test strip Use to test blood sugar three times per day. 11/20/19   [name], MD  insulin detemir (LEVEMIR) 100 UNIT/ML FlexPen Inject 15 Units into the skin daily. 07/26/21   Erskine Emery, MD  Insulin Pen Needle 32G X 4 MM MISC Use to inject insulin up to 4 times daily as directed, 05/16/21     ipratropium-albuterol (DUONEB) 0.5-2.5 (3) MG/3ML SOLN Take 3 mLs by nebulization every 4 (four) hours as needed. 05/19/21   [name], MD  lactose free nutrition (BOOST) LIQD Take 237 mLs by mouth 3 (three) times a week. Patient not taking: Reported on 08/19/2021    [provider]  losartan (COZAAR) 25 MG tablet Take  1/2 tablet (12.5 mg total) by mouth daily. 08/30/21 09/29/21  Merrily Brittle, DO  metFORMIN (GLUCOPHAGE) 1000 MG tablet TAKE 1 TABLET EVERY DAY 09/15/21   [name], MD  nicotine (NICODERM CQ - DOSED IN MG/24 HR) 7 mg/24hr patch Place 1 patch (7 mg total) onto the skin daily. Patient not taking: Reported on 07/20/2021 07/01/21   Shary Key, DO  pantoprazole (PROTONIX) 40 MG tablet Take 1 tablet by mouth twice daily 11/03/21   [name], MD  traZODone (DESYREL) 50 MG tablet Take 0.5-1 tablets (25-50 mg total) by mouth at bedtime as needed for  sleep. 05/19/21   [name], MD  TRUEplus Lancets 33G MISC Use to test blood sugar three times per day. Patient not taking: Reported on 05/07/2021 11/20/19   [name], MD  warfarin (COUMADIN) 2.5 MG tablet Take 1 tablet (2.5 mg total) by mouth daily. 07/29/21 07/29/22  Eppie Gibson, MD      Allergies    Patient has no known allergies.    Review of Systems   Review of Systems  Unable to perform ROS: Acuity of condition  Constitutional:  Negative for fever.  HENT:  Negative for facial swelling.   Respiratory:  Positive for shortness of breath and wheezing.     Physical Exam Updated Vital Signs BP 124/67   Pulse 91   Resp 18   Ht _0  (1.778 m)   Wt 88.5 kg   SpO2 100%   BMI 27.99 kg/m  Physical Exam Vitals and nursing note reviewed. Exam conducted with a chaperone present.  Constitutional:      General: He is not in acute distress.    Appearance: He is well-developed. He is not diaphoretic.     Comments: Minimally responsive  HENT:     Head: Normocephalic and atraumatic.     Nose: Nose normal.     Mouth/Throat:     Pharynx: Oropharynx is clear.  Eyes:     Conjunctiva/sclera: Conjunctivae normal.     Pupils: Pupils are equal, round, and reactive to light.  Cardiovascular:     Rate and Rhythm: Normal rate and regular rhythm.     Pulses: Normal pulses.     Heart sounds: Normal heart sounds.  Pulmonary:     Effort: Tachypnea and respiratory distress present.     Breath sounds: Decreased air movement present. Wheezing present. No rales.  Abdominal:     General: Bowel sounds are normal.     Palpations: Abdomen is soft.     Tenderness: There is no abdominal tenderness. There is no guarding or rebound.  Musculoskeletal:        General: Normal range of motion.     Cervical back: Normal range of motion and neck supple.  Skin:    General: Skin is warm and dry.     Capillary Refill: Capillary refill takes less than 2 seconds.  Neurological:     Deep Tendon  Reflexes: Reflexes normal.  Psychiatric:     Comments: Unable      ED Results / Procedures / Treatments   Labs (all labs ordered are listed, but only abnormal results are displayed) Results for orders placed or performed during the hospital encounter of 02/12/22  Comprehensive metabolic panel  Result Value Ref Range   Sodium 138 135 - 145 mmol/L   Potassium 5.0 3.5 - 5.1 mmol/L   Chloride 99 98 - 111 mmol/L   CO2 27 22 - 32 mmol/L  Glucose, Bld 245 (H) 70 - 99 mg/dL   BUN 30 (H) 8 - 23 mg/dL   Creatinine, Ser 1.94 (H) 0.61 - 1.24 mg/dL   Calcium 9.0 8.9 - 10.3 mg/dL   Total Protein 7.6 6.5 - 8.1 g/dL   Albumin 3.4 (L) 3.5 - 5.0 g/dL   AST 16 15 - 41 U/L   ALT 11 0 - 44 U/L   Alkaline Phosphatase 72 38 - 126 U/L   Total Bilirubin 0.3 0.3 - 1.2 mg/dL   GFR, Estimated 37 (L) >60 mL/min   Anion gap 12 5 - 15  CBC with Differential  Result Value Ref Range   WBC 16.9 (H) 4.0 - 10.5 K/uL   RBC 3.91 (L) 4.22 - 5.81 MIL/uL   Hemoglobin 11.7 (L) 13.0 - 17.0 g/dL   HCT 37.7 (L) 39.0 - 52.0 %   MCV 96.4 80.0 - 100.0 fL   MCH 29.9 26.0 - 34.0 pg   MCHC 31.0 30.0 - 36.0 g/dL   RDW 13.0 11.5 - 15.5 %   Platelets 325 150 - 400 K/uL   nRBC 0.0 0.0 - 0.2 %   Neutrophils Relative % 62 %   Neutro Abs 10.6 (H) 1.7 - 7.7 K/uL   Lymphocytes Relative 26 %   Lymphs Abs 4.3 (H) 0.7 - 4.0 K/uL   Monocytes Relative 6 %   Monocytes Absolute 1.1 (H) 0.1 - 1.0 K/uL   Eosinophils Relative 4 %   Eosinophils Absolute 0.7 (H) 0.0 - 0.5 K/uL   Basophils Relative 1 %   Basophils Absolute 0.1 0.0 - 0.1 K/uL   Immature Granulocytes 1 %   Abs Immature Granulocytes 0.11 (H) 0.00 - 0.07 K/uL  Protime-INR  Result Value Ref Range   Prothrombin Time 15.7 (H) 11.4 - 15.2 seconds   INR 1.3 (H) 0.8 - 1.2  I-Stat venous blood gas, ED  Result Value Ref Range   pH, Ven 7.111 (LL) 7.25 - 7.43   pCO2, Ven 96.1 (HH) 44 - 60 mmHg   pO2, Ven 140 (H) 32 - 45 mmHg   Bicarbonate 30.6 (H) 20.0 - 28.0 mmol/L    TCO2 34 (H) 22 - 32 mmol/L   O2 Saturation 98 %   Acid-base deficit 1.0 0.0 - 2.0 mmol/L   Sodium 138 135 - 145 mmol/L   Potassium 5.0 3.5 - 5.1 mmol/L   Calcium, Ion 1.23 1.15 - 1.40 mmol/L   HCT 34.0 (L) 39.0 - 52.0 %   Hemoglobin 11.6 (L) 13.0 - 17.0 g/dL   Sample type VENOUS    Comment NOTIFIED PHYSICIAN   I-stat chem 8, ED  Result Value Ref Range   Sodium 138 135 - 145 mmol/L   Potassium 5.1 3.5 - 5.1 mmol/L   Chloride 100 98 - 111 mmol/L   BUN 36 (H) 8 - 23 mg/dL   Creatinine, Ser 1.90 (H) 0.61 - 1.24 mg/dL   Glucose, Bld 249 (H) 70 - 99 mg/dL   Calcium, Ion 1.19 1.15 - 1.40 mmol/L   TCO2 33 (H) 22 - 32 mmol/L   Hemoglobin 11.6 (L) 13.0 - 17.0 g/dL   HCT 34.0 (L) 39.0 - 52.0 %  I-Stat arterial blood gas, ED [facility] ED only)  Result Value Ref Range   pH, Arterial 7.084 (LL) 7.35 - 7.45   pCO2 arterial 109.6 (HH) 32 - 48 mmHg   pO2, Arterial 504 (H) 83 - 108 mmHg   Bicarbonate 32.7 (H) 20.0 - 28.0 mmol/L  TCO2 36 (H) 22 - 32 mmol/L   O2 Saturation 100 %   Acid-Base Excess 0.0 0.0 - 2.0 mmol/L   Sodium 138 135 - 145 mmol/L   Potassium 5.1 3.5 - 5.1 mmol/L   Calcium, Ion 1.30 1.15 - 1.40 mmol/L   HCT 33.0 (L) 39.0 - 52.0 %   Hemoglobin 11.2 (L) 13.0 - 17.0 g/dL   Patient temperature 98.6 F    Collection site RADIAL, ALLEN'S TEST ACCEPTABLE    Drawn by Operator    Sample type ARTERIAL    Comment NOTIFIED PHYSICIAN   Troponin I (High Sensitivity)  Result Value Ref Range   Troponin I (High Sensitivity) 49 (H) <18 ng/L   CT Head Wo Contrast  Result Date: 02/12/2022 CLINICAL DATA:  Blunt poly trauma, unresponsive. EXAM: CT HEAD WITHOUT CONTRAST TECHNIQUE: Contiguous axial images were obtained from the base of the skull through the vertex without intravenous contrast. RADIATION DOSE REDUCTION: This exam was performed according to the departmental dose-optimization program which includes automated exposure control, adjustment of the mA and/or kV according to patient size  and/or use of iterative reconstruction technique. COMPARISON:  None Available. 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 There is mild-to-moderate cerebral atrophy, mild atrophic ventriculomegaly and moderate to severe small vessel disease the cerebral white matter. Relatively mild cerebellar atrophy. No asymmetry is seen concerning for acute infarct, hemorrhage or mass. There is no midline shift. Basal cisterns are clear. Vascular: Both carotid siphons of the distal left vertebral artery are heavily calcified. No hyperdense central vessel is seen. Skull: No fracture or focal lesion is evident. There is no visible scalp hematoma. Sinuses/Orbits: There is mild membrane thickening in ethmoid sinus. There is a small retention cyst or polyp inferiorly in the left maxillary sinus and mild membrane disease in the medial right maxillary sinus. The frontal and sphenoid sinus are clear. There is bilateral proptosis and proliferated intraorbital fat but there is no extraocular muscle thickening. Other: There is trace fluid in right mastoid tip. There is mild patchy fluid in the lower left mastoid air cells. Both middle ear cavities remaining mastoids are clear. There is an S shaped nasal septum. The scout image demonstrates orotracheal and oroenteric intubation. IMPRESSION: 1. No acute intracranial CT findings or depressed skull fractures. 2. Small left mastoid effusion, trace right mastoid fluid. 3. Sinus membrane disease. 4. Chronic right occipital PCA infarct. 5. Atrophy with moderate to severe small vessel disease 6. Proptosis and bilateral proliferated intraorbital fat, which may be seen in the setting of obesity, steroid use, and thyroid ophthalmopathy. There is no thickening of the extraocular muscles. Electronically Signed   By: Telford Nab M.D.   On: 02/12/2022 06:14   DG Chest Portable 1 View  Result  Date: 02/12/2022 CLINICAL DATA:  Verify intubation. EXAM: PORTABLE CHEST 1 VIEW COMPARISON:  Portable chest 08/19/2021. FINDINGS: 5:20 a.m. NGT is in place, the tip abutting the far wall of the body of the stomach. ETT is inserted with the tip 6.3 cm from the carina in between the heads of the clavicles. There is cardiomegaly with mild perihilar vascular congestion. Mild interstitial edema seen in the lower zones. There are small layering pleural effusions. Patchy hazy opacities of the right lung perihilar areas and left lower lung field are noted and could be due to ground-glass edema, pneumonitis or combination. The left upper lung field is clear. There is a  stable mediastinal configuration with calcification in the aorta. Osteopenia and mild thoracic dextroscoliosis. IMPRESSION: 1. Support tubes as above. 2. Mild perihilar vascular congestion and lower zonal interstitial edema with small pleural effusions. 3. Bilateral lung opacities consistent with edema, pneumonia or combination, right-greater-than-left. 4. Clinical correlation and radiographic follow-up recommended. Electronically Signed   By: Telford Nab M.D.   On: 02/12/2022 06:05    EKG  EKG Interpretation  Date/Time:  Thursday February 12 2022 06:33:25 EDT Ventricular Rate:  87 PR Interval:  157 QRS Duration: 101 QT Interval:  386 QTC Calculation: 465 R Axis:   47 Text Interpretation: Sinus rhythm Abnormal R-wave progression, early transition Confirmed by Dory Horn) on 02/12/2022 6:45:51 AM         Radiology DG Chest Portable 1 View  Result Date: 02/12/2022 CLINICAL DATA:  Verify intubation. EXAM: PORTABLE CHEST 1 VIEW COMPARISON:  Portable chest 08/19/2021. FINDINGS: 5:20 a.m. NGT is in place, the tip abutting the far wall of the body of the stomach. ETT is inserted with the tip 6.3 cm from the carina in between the heads of the clavicles. There is cardiomegaly with mild perihilar vascular congestion. Mild  interstitial edema seen in the lower zones. There are small layering pleural effusions. Patchy hazy opacities of the right lung perihilar areas and left lower lung field are noted and could be due to ground-glass edema, pneumonitis or combination. The left upper lung field is clear. There is a stable mediastinal configuration with calcification in the aorta. Osteopenia and mild thoracic dextroscoliosis. IMPRESSION: 1. Support tubes as above. 2. Mild perihilar vascular congestion and lower zonal interstitial edema with small pleural effusions. 3. Bilateral lung opacities consistent with edema, pneumonia or combination, right-greater-than-left. 4. Clinical correlation and radiographic follow-up recommended. Electronically Signed   By: Telford Nab M.D.   On: 02/12/2022 06:05    Procedures Procedures    Medications Ordered in ED Medications  propofol (DIPRIVAN) 1000 MG/100ML infusion (has no administration in time range)  etomidate (AMIDATE) injection (20 mg Intravenous Given 02/12/22 0513)  fentaNYL 2558mg in NS 2571m(1074mml) infusion-PREMIX (25 mcg/hr Intravenous New Bag/Given 02/12/22 0541)  cefTRIAXone (ROCEPHIN) 1 g in sodium chloride 0.9 % 100 mL IVPB (has no administration in time range)  furosemide (LASIX) injection 40 mg (has no administration in time range)  azithromycin (ZITHROMAX) 500 mg in sodium chloride 0.9 % 250 mL IVPB (has no administration in time range)  naloxone ([facility] MG/2ML injection (2 mg  Given 02/12/22 0504)    ED Course/ Medical Decision Making/ A&P                           Medical Decision Making Patient with COPD and PVD called out for sudden onset SOB  Problems Addressed: Acute pulmonary edema ([place]CC[place]plicated acute illness or injury    Details: Lasix, intubated admission to ICU Acute respiratory failure with hypoxia and hypercarbia ([place]   Details: LTV strategy on vent increased RR, to decrease CO2, ICU admission  Pneumonia due to infectious  organism, unspecified laterality, unspecified part of lung: acute illness or injury    Details: Blood cultures, intubated antibiotics initiated   Amount and/or Complexity of Data Reviewed Independent Historian: EMS    Details: See above  External Data Reviewed: ECG and notes.    Details: Previous notes reviewed Labs: ordered.    Details: All labs reviewed:  acidosis 7.084 with hypercarbia 109.6 on first EKG. 2nd ABG 7.111 still  acidotic  Normal sodium 138, normal potassium 5, elevated BUN 30, elevated creatinine 1.94, elevated glucose 245, normal LFTs.  Elevated white count 16.9, low hemoglobin 11.7, normal platelet count 325k.  Elevated PT 15.7, elevated INR 1.3.  blood cultures sent.   Radiology: ordered and independent interpretation performed.    Details: CHF with pleural effusions by me on CXR.  CT without ICH by me  ECG/medicine tests: ordered and independent interpretation performed. Decision-making details documented in ED Course.  Risk Prescription drug management. Parenteral controlled substances. Decision regarding hospitalization.    Final Clinical Impression(s) / ED Diagnoses Final diagnoses:  Acute respiratory failure with hypoxia and hypercarbia (HCC)  COPD exacerbation ([place])   The patient appears reasonably stabilized for admission considering the current resources, flow, and capabilities available in the ED at this time, and I doubt any other [facility] requiring further screening and/or treatment in the ED prior to admission.  Rx / DC Orders ED Discharge Orders     None         Daegon Deiss, MD 02/12/22 6720

## 2022-02-12 NOTE — Progress Notes (Signed)
Rt and RN transported vent patient to CT and back to ED. Vital signs stable through out.

## 2022-02-12 NOTE — ED Notes (Signed)
Pt continues to lift his legs and arms up and down MD notified and asked if propofol drip be restarted

## 2022-02-12 NOTE — ED Notes (Signed)
Resp attempting intubation at this time. Successful with 7.5 tube marked 25 at lips, +co2 change, lung sounds present

## 2022-02-12 NOTE — ED Notes (Signed)
CC at bedside 

## 2022-02-12 NOTE — ED Notes (Signed)
Went to assess pt restlessness after starting propofol drip and increasing fentanyl drip pt BP was low.  Drip adjusted and will cont. To assess the BP and notify MD if no change with decrease in drips

## 2022-02-12 NOTE — ED Notes (Signed)
Transported to ct at this time ?

## 2022-02-12 NOTE — ED Notes (Signed)
Family states she is leaving but would like to be updated about any changes Rodney [name]

## 2022-02-12 NOTE — Progress Notes (Signed)
Patient's daughter and his significant other were updated at bedside.  Patient is awake and writing himself. When discussed about CODE STATUS he stated that he would like to get a chance if you can come back to his baseline but if he sustained CT of brain damage and cannot come back to his baseline then he would like [place] to proceed with comfort care.  CODE STATUS was changed to full code after confirmation with patient, his significant other and his daughter     Jacky Kindle, MD [place] See Amion for pager If no response to pager, please call [phone] until 7pm After 7pm, Please call E-link [phone]

## 2022-02-12 NOTE — ED Notes (Signed)
Returned from ct at this time ?

## 2022-02-12 NOTE — Progress Notes (Signed)
Patient transported from ED to 28M 14 without complications. RN at bedside.

## 2022-02-12 NOTE — ED Notes (Signed)
ED provider at bedside with family due to them stating that they didn't understand life support

## 2022-02-12 NOTE — TOC Initial Note (Signed)
Transition of Care [facility]) - Initial/Assessment Note    Patient Details  Name: Rodney [name] MRN: [id number] Date of Birth: Nov 09, 1950  Transition [facility]) CM/SW Contact:    Verdell Carmine, RN Phone Number: 02/12/2022, 12:58 PM  Clinical Narrative:                  71yo history of COPD afib presents with respiratory distress. Intubated in the field. He is projected to need SNF post hospitalization. PT will assess  when extubated.and supply their recommendations.  TOC will follow for needs, recommendations and transitions. Expected Discharge Plan: Skilled Nursing Facility Barriers to Discharge: Continued Medical Work up   Patient Goals and CMS Choice        Expected Discharge Plan and Services Expected Discharge Plan: [place]   Discharge Planning Services: CM Consult   Living arrangements for the past 2 months: Mobile Home                                      Prior Living Arrangements/Services Living arrangements for the past 2 months: Mobile Home Lives with:: Significant Other Patient language and need for interpreter reviewed:: Yes        Need for Family Participation in Patient Care: Yes (Comment) Care giver support system in place?: No (comment)   Criminal Activity/Legal Involvement Pertinent to Current Situation/Hospitalization: No - Comment as needed  Activities of Daily Living      Permission Sought/Granted                  Emotional Assessment   Attitude/Demeanor/Rapport: Sedated Affect (typically observed): Unable to Assess Orientation: : Fluctuating Orientation (Suspected and/or reported Sundowners) Alcohol / Substance Use: Not Applicable Psych Involvement: No (comment)  Admission diagnosis:  Respiratory failure ([place]) [J96.90] Acute pulmonary edema (HCC) [J81.0] Pleural effusion [J90] COPD exacerbation (HCC) [J44.1] Acute respiratory failure with hypoxia and hypercarbia (HCC) [J96.01, J96.02] Pneumonia  due to infectious organism, unspecified laterality, unspecified part of lung [J18.9] Patient Active Problem List   Diagnosis Date Noted   Respiratory failure ([place]) 02/12/2022   History of multiple blood transfusions 08/20/2021   History of amputation of right great toe ([place]) 08/20/2021   Emphysema of lung ([place]) 08/20/2021   Atherosclerosis of aorta (HCC) 08/20/2021   Pleural effusion on right    Chronic blood loss anemia    COPD exacerbation (HCC) 08/19/2021   Acute on chronic respiratory failure with hypoxia and hypercapnia (HCC) 08/19/2021   Pneumonia 07/20/2021   Pressure injury of skin 06/20/2021   C. 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 ([place]) 08/04/2020   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   AKI (acute kidney injury) ([place]) 04/03/2020   CAD (coronary artery disease) 02/15/2020   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   Hx of BKA, 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   High risk social situation 11/14/2019   Atrial flutter (HCC)    AF (  paroxysmal atrial fibrillation) ([place]) 10/03/2019   HFrEF (heart failure with reduced ejection fraction) ([place]) 09/28/2019   Severe aortic stenosis 09/28/2019   Anemia of chronic disease 09/27/2019   Protein calorie malnutrition ([place])    PVD (peripheral vascular disease) ([place])    Umbilical hernia 62/56/3893   Tobacco abuse 09/07/2013   DM (diabetes mellitus), type 2 with neurological complications ([place]) 73/42/8768   Hypercholesteremia 10/28/2010   ERECTILE DYSFUNCTION  05/22/2009   Hypertension associated with chronic kidney disease due to type 2 diabetes mellitus ([place]) 01/23/2009   PCP:  [name], MD Pharmacy:   [place], [place] - [address] [address] Phone: [phone] Fax: [phone]  [facility] - [place], [place] [address] Phone: [phone] Fax: [phone]  [place] [address] [place]), [place] - [address] [place] ([place]) [place] [address] Phone: [phone] Fax: [phone]     Social Determinants of Health ([place]) Interventions    Readmission Risk Interventions    08/21/2021    5:10 PM 06/26/2021    4:36 PM 05/16/2021    3:13 PM  Readmission Risk Prevention Plan  Transportation Screening Complete Complete Complete  Medication Review ([place]) Complete Complete Complete  PCP or Specialist appointment within 3-5 days of discharge Complete Complete Complete  HRI or Home Care Consult Complete Complete Complete  SW Recovery Care/Counseling Consult Complete Complete Complete  Palliative Care Screening Not Applicable Not Applicable Not [place] Not Applicable Complete Not Applicable

## 2022-02-12 NOTE — ED Notes (Signed)
Verbal report given to Teresita Madura RN at this time

## 2022-02-12 NOTE — ED Notes (Addendum)
Pt arrived obtunded with agonal resp with [facility]. Pt is non-verbal and is currently not responding to pain. ED provider at bedside, resp at bedside. Pt placed on bi-pap at this time per ED provider request while setting up for intubation. Currently no family at bedside. Decision to intubate was made and RSI kit obtained as well as Narcan per providers request. Pt is a LBKA. Odor of cigarette smoke noted on pt.

## 2022-02-12 NOTE — ED Notes (Signed)
ED provider at bedside.

## 2022-02-12 NOTE — ED Notes (Signed)
Pt sheets changed, gown placed, and adjusted in bed at this time. Pt remains on fentanyl drip and on ventilator at this time

## 2022-02-12 NOTE — Procedures (Signed)
Intubation Procedure Note  Rodney [name]  [id number]  09/25/1950  Date:02/12/22  Time:5:33 AM   Provider Performing:Shakur Lembo, Raelyn Number    Procedure: Intubation (22633)  Indication(s) Respiratory Failure  Consent Unable to obtain consent due to emergent nature of procedure.     Time Out Verified patient identification, verified procedure, site/side was marked, verified correct patient position, special equipment/implants available, medications/allergies/relevant history reviewed, required imaging and test results available.   Sterile Technique Usual hand hygeine, masks, and gloves were used   Procedure Description Patient positioned in bed supine.  Sedation given as noted above.  Patient was intubated with endotracheal tube using Glidescope.  View was Grade 1 full glottis .  Number of attempts was  2 .  Colorimetric CO2 detector was consistent with tracheal placement.   Complications/Tolerance None; patient tolerated the procedure well. Chest X-ray is ordered to verify placement.   EBL Minimal   Specimen(s) None

## 2022-02-12 NOTE — ED Notes (Signed)
Resp attempting intubation at this time unsuccessful pt being bvm at this time

## 2022-02-12 NOTE — ED Triage Notes (Signed)
BIB [facility] from home. Difficulty breathing. Pt administered 1 albuterol tx PTA of EMS and was begging for his C-pap. Per EMS pt developed AMS while transferring pt to unit. Pt received 1 albuterol and 2 duoneb's while en route to hospital. Per EMS wheezing Lt upper lobe, Lt lower diminished, entire Rt side diminished. PIV 20 ga RT AC by EMS, last VS with EMS  HR 110, BP 216/104, O2 sats 100% with neb tx, ETCO@ 89, CBG 240.

## 2022-02-12 NOTE — ED Notes (Signed)
Bipap placed at this ti me

## 2022-02-12 NOTE — Plan of Care (Signed)
  Interdisciplinary Goals of Care Family Meeting   Date carried out:: 02/12/2022  Location of the meeting: Conference room  Member's involved: Family Member or next of kin and Other: Chartered loss adjuster or acting medical decision maker: Wife  Rodney [name]  Discussion: We discussed goals of care for [employer] .  Rodney [name] describes the last year as very difficult for Rodney [name], he has undergone multiple hospitalizations, recent toe amputation, worsening respiratory status and has not been able to walk much at all for at least 12 months.  He needs a TAVR, but also several teeth pulled before this which they have not been able to afford.  We discussed his current status and she shares that he never wanted to live on life support.  She would like to see how the next 24-48 hrs go, if his clinical picture is worsening and he would not be able to be extubated then would want comfort care.   She does not think he would want chest compressions and code changed to DNR  Code status: Full DNR  Disposition: Continue current acute care   Time spent for the meeting: 25 minutes  Rodney [name] 02/12/2022, 6:57 AM

## 2022-02-12 NOTE — Progress Notes (Signed)
Initial Nutrition Assessment  DOCUMENTATION CODES:   Not applicable  INTERVENTION:   Initiate trickle tube feeds via OG tube: - Peptamen 1.5 @ 20 ml/hr (480 ml/day)  Trickle tube feeding regimen provides 720 kcal, 33 grams of protein, and 369 ml of H2O.    RD will monitor for ability to advance tube feeds to goal: - Peptamen 1.5 @ 55 ml/hr (1320 ml/day) - PROSource TF20 60 ml BID  Recommended tube feeding regimen at goal rate would provide 2140 kcal, 130 grams of protein, and 1014 ml of H2O.   NUTRITION DIAGNOSIS:   Inadequate oral intake related to inability to eat as evidenced by NPO status.  GOAL:   Patient will meet greater than or equal to 90% of their needs  MONITOR:   Vent status, Labs, Weight trends, TF tolerance, I & O's  REASON FOR ASSESSMENT:   Ventilator, Consult Enteral/tube feeding initiation and management (trickle tube feeds)  ASSESSMENT:   71 year old male who presented to the ED on 10/19 with SOB. Pt required intubation in the ED. PMH of COPD, atrial fibrillation, CAD, T2DM, PAD, s/p L BKA, CHF, CKD stage IIIa, HTN, HLD. Pt admitted with acute COPD exacerbation, AKI.  Discussed pt with RN and during ICU rounds. Consult received for trickle tube feeding initiation and management. Pt with OG tube in stomach per x-ray.  Unable to obtain diet and weight history at this time. Reviewed weight history in chart. Pt with a 19.6 kg weight loss over the last 9 months. This is an 18.1% weight loss which is clinically significant for timeframe. Suspect that some of weight loss could be attributed to volume status given CHF diagnosis. However, also suspect true dry weight loss is present.  Pt able to communicate with RD and RN at bedside. Pt with muscle depletions to RLE and is s/p L BKA. Pt reports that he uses a wheelchair at baseline.  Patient is currently intubated on ventilator support MV: 10.5 L/min Temp (24hrs), Avg:97.4 F (36.3 C), Min:97.2 F (36.2 C),  Max:97.7 F (36.5 C)  Drips: Propofol: 2.66 ml/hr (provides 70 kcal daily from lipid) Fentanyl  Medications reviewed and include: colace, SSI q 4 hours, levemir 10 units daily, IV solu-medrol, protonix, miralax, IV abx  Labs reviewed: BUN 36, creatinine 1.90, WBC 16.9, hemoglobin 9.2 CBG's: 195-218  NUTRITION - FOCUSED PHYSICAL EXAM:  Flowsheet Row Most Recent Value  Orbital Region Mild depletion  Upper Arm Region Mild depletion  Thoracic and Lumbar Region No depletion  Buccal Region Unable to assess  Temple Region Mild depletion  Clavicle Bone Region Mild depletion  Clavicle and Acromion Bone Region Mild depletion  Scapular Bone Region Unable to assess  Dorsal Hand No depletion  Patellar Region Moderate depletion  Anterior Thigh Region Moderate depletion  Posterior Calf Region Moderate depletion  Edema (RD Assessment) None  Hair Reviewed  Eyes Reviewed  Mouth Reviewed  Skin Reviewed  Nails Reviewed       Diet Order:   Diet Order     None       EDUCATION NEEDS:   No education needs have been identified at this time  Skin:  Skin Assessment: Reviewed RN Assessment  Last BM:  no documented BM  Height:   Ht Readings from Last 1 Encounters:  02/12/22 '5\' 10"'$  (1.778 m)    Weight:   Wt Readings from Last 1 Encounters:  02/12/22 88.5 kg    BMI:  Body mass index is 27.99 kg/m.  Estimated Nutritional Needs:  Kcal:  2000-2200  Protein:  115-135 grams  Fluid:  >1.8 L    Gustavus Bryant, MS, RD, LDN Inpatient Clinical Dietitian Please see AMiON for contact information.

## 2022-02-12 NOTE — H&P (Signed)
NAME:  Rodney [name], MRN:  [id number], DOB:  07-17-1950, LOS: 0 ADMISSION DATE:  02/12/2022, CONSULTATION DATE: 02/12/2022 REFERRING MD: Dr. Randal Buba, CHIEF COMPLAINT: Shortness of breath  History of Present Illness:  71 year old male with COPD, paroxysmal A-fib, coronary artery disease and diabetes type 2 was brought into the emergency department with increasing shortness of breath and cough,  when EMS arrived, he was noted to be in respiratory distress with hypoxia, he was intubated and was transferred to the emergency department. Per patient's wife he has been in and out of hospital lately due to aortic stenosis, COPD and peripheral arterial disease requiring amputation. She states that he was not complaining of fever, chills, nausea or vomiting  Pertinent  Medical History   Past Medical History:  Diagnosis Date   Acute combined systolic and diastolic congestive heart failure (HCC)    Acute on chronic heart failure with preserved ejection fraction (HFpEF) (HCC)    Acute respiratory failure with hypoxia (HCC)    Acute respiratory failure with hypoxia (HCC)    Aortic stenosis    moderate in 2022   Atrial fibrillation (HCC)    CHF (congestive heart failure) (HCC)    Chronic pleural effusion, Left 11/16/2020   COPD exacerbation ([place]) 12/09/2020   Coronary artery disease    Demand ischemia 03/26/2021   Diabetes mellitus without complication ([place])    HLD (hyperlipidemia)    Hypertension    Long term (current) use of anticoagulants 12/29/2019   Malnutrition of moderate degree 05/09/2021   Peripheral arterial disease ([place])    Pneumonia due to COVID-19 virus 05/29/2021     Significant Hospital Events: Including procedures, antibiotic start and stop dates in addition to other pertinent events     Interim History / Subjective:    Objective   Blood pressure 111/62, pulse 84, resp. rate 18, height 5' 10"  (1.778 m), weight 88.5 kg, SpO2 99 %.    Vent Mode: PRVC FiO2 (%):   [40 %] 40 % Set Rate:  [16 bmp] 16 bmp Vt Set:  [580 mL] 580 mL PEEP:  [5 cmH20] 5 cmH20 Plateau Pressure:  [18 cmH20] 18 cmH20  No intake or output data in the 24 hours ending 02/12/22 0732 Filed Weights   02/12/22 0522  Weight: 88.5 kg    Examination:   Physical exam: General: Crtitically ill-appearing male, orally intubated HEENT: [place]/AT, eyes anicteric.  ETT and OGT in place Neuro: Sedated, not following commands.  Eyes are closed.  Pupils 3 mm bilateral reactive to light Chest: Bilateral wheezes, no crackles Heart: Regular rate and rhythm, no murmurs or gallops Abdomen: Soft, nontender, nondistended, bowel sounds present Skin: No rash  Resolved Hospital Problem list     Assessment & Plan:  Acute hypoxic/hypercapnic respiratory failure Acute COPD exacerbation Acute respiratory acidosis Acute kidney injury on CKD stage IIIa Anemia of chronic disease Severe aortic stenosis Peripheral arterial disease Coronary artery disease Diabetes type 2 with hyperglycemia  Continue lung protective ventilation, vent setting was adjusted to clear hypercapnia Trend ABGs and lactate Continue nebs Started on IV steroids and antibiotic with ceftriaxone azithromycin Monitor intake and output Avoid nephrotoxic agents Monitor H&H Patient was supposed to get TAVR at some point Resume Plavix Continue insulin with sliding scale and Levemir with CBG goal 140-180  Best Practice (right click and "Reselect all SmartList Selections" daily)   Diet/type: NPO DVT prophylaxis: Coumadin GI prophylaxis: PPI Lines: N/A Foley:  N/A Code Status:  DNR Last date of multidisciplinary goals of care  discussion [10/19, please see Ipal note]  Labs   CBC: Recent Labs  Lab 02/12/22 0509 02/12/22 0519 02/12/22 0527 02/12/22 0730  WBC  --  16.9*  --   --   NEUTROABS  --  10.6*  --   --   HGB 11.2* 11.7* 11.6*  11.6* 9.2*  HCT 33.0* 37.7* 34.0*  34.0* 27.0*  MCV  --  96.4  --   --   PLT  --  325   --   --     Basic Metabolic Panel: Recent Labs  Lab 02/12/22 0509 02/12/22 0519 02/12/22 0527 02/12/22 0730  NA 138 138 138  138 137  K 5.1 5.0 5.0  5.1 4.9  CL  --  99 100  --   CO2  --  27  --   --   GLUCOSE  --  245* 249*  --   BUN  --  30* 36*  --   CREATININE  --  1.94* 1.90*  --   CALCIUM  --  9.0  --   --    GFR: Estimated Creatinine Clearance: 40.5 mL/min (A) (by C-G formula based on SCr of 1.9 mg/dL (H)). Recent Labs  Lab 02/12/22 0519  WBC 16.9*    Liver Function Tests: Recent Labs  Lab 02/12/22 0519  AST 16  ALT 11  ALKPHOS 72  BILITOT 0.3  PROT 7.6  ALBUMIN 3.4*   No results for input(s): "LIPASE", "AMYLASE" in the last 168 hours. No results for input(s): "AMMONIA" in the last 168 hours.  ABG    Component Value Date/Time   PHART 7.366 02/12/2022 0730   PCO2ART 50.1 (H) 02/12/2022 0730   PO2ART 96 02/12/2022 0730   HCO3 28.7 (H) 02/12/2022 0730   TCO2 30 02/12/2022 0730   ACIDBASEDEF 1.0 02/12/2022 0527   O2SAT 97 02/12/2022 0730     Coagulation Profile: Recent Labs  Lab 02/12/22 0519  INR 1.3*    Cardiac Enzymes: No results for input(s): "CKTOTAL", "CKMB", "CKMBINDEX", "TROPONINI" in the last 168 hours.  HbA1C: HbA1c, POC (controlled diabetic range)  Date/Time Value Ref Range Status  10/14/2020 11:30 AM 6.1 0.0 - 7.0 % Final  09/21/2019 02:51 PM 8.7 (A) 0.0 - 7.0 % Final   Hgb A1c MFr Bld  Date/Time Value Ref Range Status  08/20/2021 02:35 AM 5.9 (H) 4.8 - 5.6 % Final    Comment:    (NOTE) Pre diabetes:          5.7%-6.4%  Diabetes:              >6.4%  Glycemic control for   <7.0% adults with diabetes   03/24/2021 07:03 AM 6.9 (H) 4.8 - 5.6 % Final    Comment:    (NOTE)         Prediabetes: 5.7 - 6.4         Diabetes: >6.4         Glycemic control for adults with diabetes: <7.0     CBG: No results for input(s): "GLUCAP" in the last 168 hours.  Review of Systems:   Unable to obtain as patient is intubated and  sedated  Past Medical History:  He,  has a past medical history of Acute combined systolic and diastolic congestive heart failure ([place]), Acute on chronic heart failure with preserved ejection fraction (HFpEF) ([place]), Acute respiratory failure with hypoxia ([place]), Acute respiratory failure with hypoxia ([place]), Aortic stenosis, Atrial fibrillation ([place]), CHF (congestive heart failure) ([place]), Chronic pleural  effusion, Left (11/16/2020), COPD exacerbation ([place]) (12/09/2020), Coronary artery disease, Demand ischemia (03/26/2021), Diabetes mellitus without complication ([place]), HLD (hyperlipidemia), Hypertension, Long term (current) use of anticoagulants (12/29/2019), Malnutrition of moderate degree (05/09/2021), Peripheral arterial disease ([place]), and Pneumonia due to COVID-19 virus (05/29/2021).   Surgical History:   Past Surgical History:  Procedure Laterality Date   ABDOMINAL AORTOGRAM W/LOWER EXTREMITY N/A 08/05/2020   Procedure: ABDOMINAL AORTOGRAM W/LOWER EXTREMITY;  Surgeon: Marty Heck, MD;  Location: [facility];  Service: Cardiovascular;  Laterality: N/A;   ABDOMINAL AORTOGRAM W/LOWER EXTREMITY N/A 11/13/2020   Procedure: ABDOMINAL AORTOGRAM W/LOWER EXTREMITY;  Surgeon: Cherre Robins, MD;  Location: [facility];  Service: Cardiovascular;  Laterality: N/A;   ABDOMINAL AORTOGRAM W/LOWER EXTREMITY N/A 05/12/2021   Procedure: ABDOMINAL AORTOGRAM W/LOWER EXTREMITY;  Surgeon: Waynetta Sandy, MD;  Location: [facility];  Service: Cardiovascular;  Laterality: N/A;   AMPUTATION Left 09/28/2019   Procedure: AMPUTATION BELOW KNEE;  Surgeon: Newt Minion, MD;  Location: [facility];  Service: Orthopedics;  Laterality: Left;   AMPUTATION Right 11/15/2020   Procedure: RIGHT GREAT TOE AMPUTATION;  Surgeon: Newt Minion, MD;  Location: [facility];  Service: Orthopedics;  Laterality: Right;   AMPUTATION Right 08/26/2021   Procedure: AMPUTATION 2nd TOE;  Surgeon: Waynetta Sandy, MD;  Location: [facility];  Service: Vascular;  Laterality: Right;   CARDIAC CATHETERIZATION     CARDIOVERSION N/A 10/05/2019   Procedure: CARDIOVERSION;  Surgeon: Sanda Klein, MD;  Location: [facility];  Service: Cardiovascular;  Laterality: N/A;   FEMORAL-POPLITEAL BYPASS GRAFT Right 08/07/2020   Procedure: RIGHT FEMORAL TO BELOW KNEE POPLITEAL ARTERY BYPASS;  Surgeon: Waynetta Sandy, MD;  Location: [facility];  Service: Vascular;  Laterality: Right;   LEFT HEART CATH AND CORONARY ANGIOGRAPHY N/A 10/03/2019   Procedure: LEFT HEART CATH AND CORONARY ANGIOGRAPHY;  Surgeon: Lorretta Harp, MD;  Location: [facility];  Service: Cardiovascular;  Laterality: N/A;   PERIPHERAL VASCULAR INTERVENTION Right 08/05/2020   Procedure: PERIPHERAL VASCULAR INTERVENTION;  Surgeon: Marty Heck, MD;  Location: [facility];  Service: Cardiovascular;  Laterality: Right;  common Iliac   PERIPHERAL VASCULAR INTERVENTION Left 11/13/2020   Procedure: PERIPHERAL VASCULAR INTERVENTION;  Surgeon: Cherre Robins, MD;  Location: [facility];  Service: Cardiovascular;  Laterality: Left;   PERIPHERAL VASCULAR INTERVENTION Right 11/14/2020   Procedure: PERIPHERAL VASCULAR INTERVENTION;  Surgeon: Marty Heck, MD;  Location: [facility];  Service: Cardiovascular;  Laterality: Right;  POP/SFA STENT   PERIPHERAL VASCULAR INTERVENTION  05/12/2021   Procedure: PERIPHERAL VASCULAR INTERVENTION;  Surgeon: Waynetta Sandy, MD;  Location: [facility];  Service: Cardiovascular;;   RIGHT/LEFT HEART CATH AND CORONARY ANGIOGRAPHY N/A 08/25/2021   Procedure: RIGHT/LEFT HEART CATH AND CORONARY ANGIOGRAPHY;  Surgeon: Troy Sine, MD;  Location: [facility];  Service: Cardiovascular;  Laterality: N/A;   TEE WITHOUT CARDIOVERSION N/A 10/05/2019   Procedure: TRANSESOPHAGEAL ECHOCARDIOGRAM (TEE);  Surgeon: Sanda Klein, MD;  Location: [facility];  Service:  Cardiovascular;  Laterality: N/A;     Social History:   reports that he quit smoking about 6 months ago. His smoking use included cigarettes. He started smoking about 10 months ago. He has a 25.00 pack-year smoking history. He has never used smokeless tobacco. He reports current alcohol use of about 6.0 standard drinks of alcohol per week. He reports current drug use. Drugs: Cocaine and Marijuana.  Family History:  His family history includes Alcoholism in his father and mother.   Allergies No Known Allergies   Home Medications  Prior to Admission medications   Medication Sig Start Date End Date Taking? Authorizing Provider  acetaminophen (TYLENOL) 500 MG tablet Take 1,000 mg by mouth every 6 (six) hours as needed for mild pain.    [provider]  albuterol (VENTOLIN HFA) 108 (90 Base) MCG/ACT inhaler INHALE 2 PUFFS INTO THE LUNGS EVERY 6 (SIX) HOURS AS NEEDED FOR WHEEZING OR SHORTNESS OF BREATH. 04/24/21   [name], MD  atorvastatin (LIPITOR) 80 MG tablet Take 1 tablet (80 mg total) by mouth daily. 08/30/21 09/29/21  Merrily Brittle, DO  Blood Glucose Monitoring Suppl (TRUE METRIX METER) DEVI Use to test blood sugar three times daily. 11/20/19   [name], MD  Blood Glucose Monitoring Suppl (TRUE METRIX METER) w/Device KIT USE AS DIRECTED 03/25/20   [name], MD  carvedilol (COREG) 12.5 MG tablet Take 6.25 mg by mouth 2 (two) times daily with a meal. 12/18/20   [provider]  clopidogrel (PLAVIX) 75 MG tablet TAKE 1 TABLET EVERY DAY WITH BREAKFAST Patient taking differently: Take 75 mg by mouth daily. 03/17/21   [name], MD  enoxaparin (LOVENOX) 100 MG/ML injection Inject 1 mL (100 mg total) into the skin 2 (two) times daily for 10 days. 08/30/21 09/09/21  Merrily Brittle, DO  ferrous sulfate 325 (65 FE) MG tablet Take 1 tablet (325 mg total) by mouth every other day. Patient not taking: Reported on 07/20/2021 05/18/21   Shary Key, DO   fluticasone [facility]) 50 MCG/ACT nasal spray Place 2 sprays into both nostrils daily. 02/13/21   Simmons-Robinson, Makiera, MD  Fluticasone-Umeclidin-Vilant (TRELEGY ELLIPTA) 100-62.5-25 MCG/ACT AEPB Inhale 1 puff into the lungs daily. 06/01/21   Eppie Gibson, MD  furosemide (LASIX) 40 MG tablet TAKE 1 TABLET EVERY DAY 09/01/21   [name], MD  glucose blood (RELION TRUE METRIX TEST STRIPS) test strip Use to test blood sugar three times per day. 11/20/19   [name], MD  insulin detemir (LEVEMIR) 100 UNIT/ML FlexPen Inject 15 Units into the skin daily. 07/26/21   Erskine Emery, MD  Insulin Pen Needle 32G X 4 MM MISC Use to inject insulin up to 4 times daily as directed, 05/16/21     ipratropium-albuterol (DUONEB) 0.5-2.5 (3) MG/3ML SOLN Take 3 mLs by nebulization every 4 (four) hours as needed. 05/19/21   [name], MD  lactose free nutrition (BOOST) LIQD Take 237 mLs by mouth 3 (three) times a week. Patient not taking: Reported on 08/19/2021    [provider]  losartan (COZAAR) 25 MG tablet Take 1/2 tablet (12.5 mg total) by mouth daily. 08/30/21 09/29/21  Merrily Brittle, DO  metFORMIN (GLUCOPHAGE) 1000 MG tablet TAKE 1 TABLET EVERY DAY 09/15/21   [name], MD  nicotine (NICODERM CQ - DOSED IN MG/24 HR) 7 mg/24hr patch Place 1 patch (7 mg total) onto the skin daily. Patient not taking: Reported on 07/20/2021 07/01/21   Shary Key, DO  pantoprazole (PROTONIX) 40 MG tablet Take 1 tablet by mouth twice daily 11/03/21   [name], MD  traZODone (DESYREL) 50 MG tablet Take 0.5-1 tablets (25-50 mg total) by mouth at bedtime as needed for sleep. 05/19/21   [name], MD  TRUEplus Lancets 33G MISC Use to test blood sugar three times per day. Patient not taking: Reported on  05/07/2021 11/20/19   [name], MD  warfarin (COUMADIN) 2.5 MG tablet Take 1 tablet (2.5 mg total) by mouth daily. 07/29/21 07/29/22  Eppie Gibson, MD     Critical care time:      Total critical care time: 48 minutes  Performed by: [place] care time was exclusive of separately billable procedures and treating other patients.   Critical care was necessary to treat or prevent imminent or life-threatening deterioration.   Critical care was time spent personally by me on the following activities: development of treatment plan with patient and/or surrogate as well as nursing, discussions with consultants, evaluation of patient's response to treatment, examination of patient, obtaining history from patient or surrogate, ordering and performing treatments and interventions, ordering and review of laboratory studies, ordering and review of radiographic studies, pulse oximetry and re-evaluation of patient's condition.   Jacky Kindle, MD [facility] See Amion for pager If no response to pager, please call [phone] until 7pm After 7pm, Please call E-link [phone]

## 2022-02-12 NOTE — Progress Notes (Signed)
  Echocardiogram 2D Echocardiogram has been performed.  Rodney [name] 02/12/2022, 3:42 PM

## 2022-02-13 DIAGNOSIS — J189 Pneumonia, unspecified organism: Secondary | ICD-10-CM | POA: Diagnosis not present

## 2022-02-13 DIAGNOSIS — J9602 Acute respiratory failure with hypercapnia: Secondary | ICD-10-CM | POA: Diagnosis not present

## 2022-02-13 DIAGNOSIS — J9601 Acute respiratory failure with hypoxia: Secondary | ICD-10-CM | POA: Diagnosis not present

## 2022-02-13 DIAGNOSIS — J441 Chronic obstructive pulmonary disease with (acute) exacerbation: Secondary | ICD-10-CM | POA: Diagnosis not present

## 2022-02-13 LAB — TRIGLYCERIDES: Triglycerides: 132 mg/dL (ref <150)

## 2022-02-13 LAB — BASIC METABOLIC PANEL
Anion gap: 11 (ref 5–15)
BUN: 45 mg/dL — ABNORMAL HIGH (ref 8–23)
CO2: 26 mmol/L (ref 22–32)
Calcium: 8.8 mg/dL — ABNORMAL LOW (ref 8.9–10.3)
Chloride: 102 mmol/L (ref 98–111)
Creatinine, Ser: 2.01 mg/dL — ABNORMAL HIGH (ref 0.61–1.24)
GFR, Estimated: 35 mL/min — ABNORMAL LOW (ref >60)
Glucose, Bld: 170 mg/dL — ABNORMAL HIGH (ref 70–99)
Potassium: 4.3 mmol/L (ref 3.5–5.1)
Sodium: 139 mmol/L (ref 135–145)

## 2022-02-13 LAB — CBC WITH DIFFERENTIAL/PLATELET
Abs Immature Granulocytes: 0.05 10*3/uL (ref 0.00–0.07)
Basophils Absolute: 0 10*3/uL (ref 0.0–0.1)
Basophils Relative: 0 %
Eosinophils Absolute: 0 10*3/uL (ref 0.0–0.5)
Eosinophils Relative: 0 %
HCT: 25.9 % — ABNORMAL LOW (ref 39.0–52.0)
Hemoglobin: 8.8 g/dL — ABNORMAL LOW (ref 13.0–17.0)
Immature Granulocytes: 1 %
Lymphocytes Relative: 9 %
Lymphs Abs: 0.9 10*3/uL (ref 0.7–4.0)
MCH: 30.6 pg (ref 26.0–34.0)
MCHC: 34 g/dL (ref 30.0–36.0)
MCV: 89.9 fL (ref 80.0–100.0)
Monocytes Absolute: 0.8 10*3/uL (ref 0.1–1.0)
Monocytes Relative: 8 %
Neutro Abs: 8.5 10*3/uL — ABNORMAL HIGH (ref 1.7–7.7)
Neutrophils Relative %: 82 %
Platelets: 230 10*3/uL (ref 150–400)
RBC: 2.88 MIL/uL — ABNORMAL LOW (ref 4.22–5.81)
RDW: 13.1 % (ref 11.5–15.5)
WBC: 10.3 10*3/uL (ref 4.0–10.5)
nRBC: 0 % (ref 0.0–0.2)

## 2022-02-13 LAB — GLUCOSE, CAPILLARY
Glucose-Capillary: 142 mg/dL — ABNORMAL HIGH (ref 70–99)
Glucose-Capillary: 152 mg/dL — ABNORMAL HIGH (ref 70–99)
Glucose-Capillary: 166 mg/dL — ABNORMAL HIGH (ref 70–99)
Glucose-Capillary: 183 mg/dL — ABNORMAL HIGH (ref 70–99)
Glucose-Capillary: 184 mg/dL — ABNORMAL HIGH (ref 70–99)
Glucose-Capillary: 213 mg/dL — ABNORMAL HIGH (ref 70–99)

## 2022-02-13 LAB — HIV ANTIBODY (ROUTINE TESTING W REFLEX): HIV Screen 4th Generation wRfx: NONREACTIVE

## 2022-02-13 MED ORDER — CLOPIDOGREL BISULFATE 75 MG PO TABS
75.0000 mg | ORAL_TABLET | Freq: Every day | ORAL | Status: DC
  Administered 2022-02-14 – 2022-02-17 (×4): 75 mg via ORAL
  Filled 2022-02-13 (×4): qty 1

## 2022-02-13 MED ORDER — ORAL CARE MOUTH RINSE: 15.0000 mL | OROMUCOSAL | Status: DC | PRN

## 2022-02-13 MED ORDER — FUROSEMIDE 10 MG/ML IJ SOLN
INTRAMUSCULAR | Status: AC
  Filled 2022-02-13: qty 8

## 2022-02-13 MED ORDER — INSULIN ASPART 100 UNIT/ML IJ SOLN
0.0000 [IU] | Freq: Every day | INTRAMUSCULAR | Status: DC
  Administered 2022-02-13: 2 [IU] via SUBCUTANEOUS

## 2022-02-13 MED ORDER — DOCUSATE SODIUM 100 MG PO CAPS
100.0000 mg | ORAL_CAPSULE | Freq: Two times a day (BID) | ORAL | Status: DC
  Administered 2022-02-13 – 2022-02-15 (×5): 100 mg via ORAL
  Filled 2022-02-13 (×7): qty 1

## 2022-02-13 MED ORDER — PANTOPRAZOLE 2 MG/ML SUSPENSION: 40.0000 mg | Freq: Every day | ORAL | Status: DC

## 2022-02-13 MED ORDER — FUROSEMIDE 10 MG/ML IJ SOLN
60.0000 mg | Freq: Once | INTRAMUSCULAR | Status: AC
  Administered 2022-02-13: 60 mg via INTRAVENOUS

## 2022-02-13 MED ORDER — WARFARIN SODIUM 4 MG PO TABS
4.0000 mg | ORAL_TABLET | Freq: Once | ORAL | Status: AC
  Administered 2022-02-13: 4 mg via ORAL
  Filled 2022-02-13: qty 1

## 2022-02-13 MED ORDER — ORAL CARE MOUTH RINSE
15.0000 mL | OROMUCOSAL | Status: DC
  Administered 2022-02-14 – 2022-02-17 (×5): 15 mL via OROMUCOSAL

## 2022-02-13 MED ORDER — AZITHROMYCIN 500 MG PO TABS
500.0000 mg | ORAL_TABLET | Freq: Every day | ORAL | Status: AC
  Administered 2022-02-13 – 2022-02-14 (×2): 500 mg via ORAL
  Filled 2022-02-13 (×2): qty 1

## 2022-02-13 MED ORDER — IPRATROPIUM-ALBUTEROL 0.5-2.5 (3) MG/3ML IN SOLN
3.0000 mL | RESPIRATORY_TRACT | Status: DC | PRN
  Administered 2022-02-13: 3 mL via RESPIRATORY_TRACT
  Filled 2022-02-13: qty 3

## 2022-02-13 MED ORDER — PREDNISONE 20 MG PO TABS
40.0000 mg | ORAL_TABLET | Freq: Every day | ORAL | Status: AC
  Administered 2022-02-14 – 2022-02-16 (×3): 40 mg via ORAL
  Filled 2022-02-13 (×3): qty 2

## 2022-02-13 MED ORDER — LABETALOL HCL 5 MG/ML IV SOLN
INTRAVENOUS | Status: AC
  Filled 2022-02-13: qty 4

## 2022-02-13 MED ORDER — PANTOPRAZOLE SODIUM 40 MG PO TBEC
40.0000 mg | DELAYED_RELEASE_TABLET | Freq: Every day | ORAL | Status: DC
  Administered 2022-02-14 – 2022-02-17 (×4): 40 mg via ORAL
  Filled 2022-02-13 (×4): qty 1

## 2022-02-13 MED ORDER — WARFARIN - PHARMACIST DOSING INPATIENT: Freq: Every day | Status: DC

## 2022-02-13 MED ORDER — DOCUSATE SODIUM 50 MG/5ML PO LIQD: 100.0000 mg | Freq: Two times a day (BID) | ORAL | Status: DC

## 2022-02-13 MED ORDER — LABETALOL HCL 5 MG/ML IV SOLN
10.0000 mg | Freq: Once | INTRAVENOUS | Status: AC
  Administered 2022-02-13: 10 mg via INTRAVENOUS

## 2022-02-13 MED ORDER — ATORVASTATIN CALCIUM 80 MG PO TABS
80.0000 mg | ORAL_TABLET | Freq: Every day | ORAL | Status: DC
  Administered 2022-02-13 – 2022-02-17 (×5): 80 mg via ORAL
  Filled 2022-02-13 (×2): qty 2
  Filled 2022-02-13 (×2): qty 1
  Filled 2022-02-13: qty 2

## 2022-02-13 MED ORDER — POLYETHYLENE GLYCOL 3350 17 G PO PACK
17.0000 g | PACK | Freq: Every day | ORAL | Status: DC
  Filled 2022-02-13 (×2): qty 1

## 2022-02-13 MED ORDER — INSULIN ASPART 100 UNIT/ML IJ SOLN
0.0000 [IU] | Freq: Three times a day (TID) | INTRAMUSCULAR | Status: DC
  Administered 2022-02-13 – 2022-02-14 (×4): 3 [IU] via SUBCUTANEOUS
  Administered 2022-02-14: 4 [IU] via SUBCUTANEOUS
  Administered 2022-02-15 (×2): 3 [IU] via SUBCUTANEOUS
  Administered 2022-02-15 – 2022-02-16 (×2): 4 [IU] via SUBCUTANEOUS
  Administered 2022-02-16 – 2022-02-17 (×2): 3 [IU] via SUBCUTANEOUS

## 2022-02-13 NOTE — Progress Notes (Addendum)
NAME:  Rodney [name], MRN:  [id number], DOB:  02-02-1951, LOS: 1 ADMISSION DATE:  02/12/2022, CONSULTATION DATE:  02/12/22 REFERRING MD:  EDP CHIEF COMPLAINT:  Respiratory Failure   History of Present Illness:  71 year old male with hx below presenting to the ED with dyspnea on exertion, and cough. He was intubated in the ED and transferred to the ICU for further care. Has had multiple hospitalizations 6 this year with the the last one in 09/03/21 for COPD exacerbation. He underwent pre-op evaluation for TAVR at that time as well. Pt's wife did not report any symptoms suggestive of infection such as fevers, chills, nausea or vomiting.  Pertinent  Medical History  COPD, HTN, HLD, CAD, PAF, DMII, CDKIII, HFrEF, Severe Aortic Stenosis, hx of left BKA, ascending aortic root dilation Significant Hospital Events: Including procedures, antibiotic start and stop dates in addition to other pertinent events    Interim History / Subjective:  Pt intubated this am but alert. Changed to pressure support this am. Later pt denying any acute concerns and state he is ready to eat. States he didn't have any other symptoms such as worsening cough, fevers or chills before coming in.  Review of Systems:   Negative unless stated in the subjective.  Objective: afebrile, HR in 70s, BP ranged from 70/38-166/78 with 106/58 this am. Intubated on 40 % FiO2.    Blood pressure (!) 106/58, pulse 79, temperature 99 F (37.2 C), resp. rate 18, height '5\' 10"'$  (1.778 m), weight 93.3 kg, SpO2 99 %.      Intake/Output Summary (Last 24 hours) at 02/13/2022 0734 Last data filed at 02/13/2022 0700 Gross per 24 hour  Intake 543.87 ml  Output 1225 ml  Net -681.13 ml   Filed Weights   02/12/22 0522 02/13/22 0450  Weight: 88.5 kg 93.3 kg   Examination: General: laying comfortably HENT: Intubated, NCAT Lungs: coarse breath sounds. Breaths sounds are decreased bilaterally.  Cardiovascular: sinus rhythm, systolic murmur  present Abdomen: No TTP, soft abdomen Extremities: left BKA, right foot with partial amputation Neuro: alert and oriented GU: no foley Consults   Resolved Hospital Problem list    Assessment & Plan:  Acute hypoxic respiratory failure 2/2 to pneumonia and COPD exacerbation (resolving) Improving today. Pt on pressure support this am. Pt is getting Rocephin and Azithromycin for pneumonia coverage. On steroids. Was given 125 mg solumedrol yesterday. Leukocytosis resolved. MRSA swab positive but previously positive so likely colonization. Urine cultures pending with NGTD. Plan to extubate today and complete five days of abx.  -Continue abx for 5 days -Continue steroids for 5 days -Continue duobnebs  AKI on CKDIIIa Anemia of chronic disease AKI on CKDIIIa. BUN:Cr ratio was 22 making it pre-renal. Baseline Creatinine around 1.5. Will encourage hydration and avoid nephrotoxic agents and renally dose meds.  -Give 500 cc bolus and encourage PO intake.  -CTM renal fxn and hgb.   DMII Chronic. On insulin and metformin at home. Will continue SSI here. CBG 160s-200s. On Levemir 10 units daily and moderate coverage so will increase to resistant and will add home dose once pt is tolerating diet.   HLD, PAD, CAD Chronic. On lipitor and plavix at home. Continue home meds. Will start lipitor today.   HFpEF HTN Paroxysmal Atrial Fibrillation Severe Aortic Stenosis Chronic. Holding home BP meds. EF is 60 %. LV with moderate concentric hypertrophy. Pt appears to be hypovolemic to euvolemic on exam. Not volume overloaded. Restart coumadin.   Hypocalcemia Pseudohypocalcemia. Normal when  corrected.   Best Practice (right click and "Reselect all SmartList Selections" daily)  Diet/type: NPO DVT prophylaxis: Coumadin GI prophylaxis: PPI Lines: N/A Foley:  N/A Continuous: Fent, Prop  Code Status:  DNR Last date of multidisciplinary goals of care discussion [10/20] Labs:  CBC with resolved  leukocytosis, hgb at 8.8, BMP with normal electrolytes, elevated BUN and Creatinine. BUN at 45, Creatinine at 2.01, Calcium 8.8, MRSA positive, UDS positive for cocaine and THC, last ABG with 7.36/50/96/28.7. UA with leukocytes and bacteria. Ucx pending. BNP elevated at 475 but at baseline.   CBC: Recent Labs  Lab 02/12/22 0509 02/12/22 0519 02/12/22 0527 02/12/22 0730 02/13/22 0510  WBC  --  16.9*  --   --  10.3  NEUTROABS  --  10.6*  --   --  8.5*  HGB 11.2* 11.7* 11.6*  11.6* 9.2* 8.8*  HCT 33.0* 37.7* 34.0*  34.0* 27.0* 25.9*  MCV  --  96.4  --   --  89.9  PLT  --  325  --   --  761   Basic Metabolic Panel: Recent Labs  Lab 02/12/22 0509 02/12/22 0519 02/12/22 0527 02/12/22 0730 02/13/22 0510  NA 138 138 138  138 137 139  K 5.1 5.0 5.0  5.1 4.9 4.3  CL  --  99 100  --  102  CO2  --  27  --   --  26  GLUCOSE  --  245* 249*  --  170*  BUN  --  30* 36*  --  45*  CREATININE  --  1.94* 1.90*  --  2.01*  CALCIUM  --  9.0  --   --  8.8*   GFR: Estimated Creatinine Clearance: 39.2 mL/min (A) (by C-G formula based on SCr of 2.01 mg/dL (H)). Recent Labs  Lab 02/12/22 0519 02/13/22 0510  WBC 16.9* 10.3   Liver Function Tests: Recent Labs  Lab 02/12/22 0519  AST 16  ALT 11  ALKPHOS 72  BILITOT 0.3  PROT 7.6  ALBUMIN 3.4*   ABG    Component Value Date/Time   PHART 7.366 02/12/2022 0730   PCO2ART 50.1 (H) 02/12/2022 0730   PO2ART 96 02/12/2022 0730   HCO3 28.7 (H) 02/12/2022 0730   TCO2 30 02/12/2022 0730   ACIDBASEDEF 1.0 02/12/2022 0527   O2SAT 97 02/12/2022 0730    Coagulation Profile: Recent Labs  Lab 02/12/22 0519  INR 1.3*    HbA1c, POC (controlled diabetic range)  Date/Time Value Ref Range Status  10/14/2020 11:30 AM 6.1 0.0 - 7.0 % Final  09/21/2019 02:51 PM 8.7 (A) 0.0 - 7.0 % Final   Hgb A1c MFr Bld  Date/Time Value Ref Range Status  08/20/2021 02:35 AM 5.9 (H) 4.8 - 5.6 % Final    Comment:    (NOTE) Pre diabetes:           5.7%-6.4%  Diabetes:              >6.4%  Glycemic control for   <7.0% adults with diabetes   03/24/2021 07:03 AM 6.9 (H) 4.8 - 5.6 % Final    Comment:    (NOTE)         Prediabetes: 5.7 - 6.4         Diabetes: >6.4         Glycemic control for adults with diabetes: <7.0    CBG: Recent Labs  Lab 02/12/22 1054 02/12/22 1556 02/12/22 2017 02/13/22 0009 02/13/22 0400  GLUCAP 195*  161* 180* 183* 99*   Past Medical History:  He,  has a past medical history of Acute combined systolic and diastolic congestive heart failure ([place]), Acute on chronic heart failure with preserved ejection fraction (HFpEF) ([place]), Acute respiratory failure with hypoxia ([place]), Acute respiratory failure with hypoxia ([place]), Aortic stenosis, Atrial fibrillation ([place]), CHF (congestive heart failure) ([place]), Chronic pleural effusion, Left (11/16/2020), COPD exacerbation ([place]) (12/09/2020), Coronary artery disease, Demand ischemia (03/26/2021), Diabetes mellitus without complication ([place]), HLD (hyperlipidemia), Hypertension, Long term (current) use of anticoagulants (12/29/2019), Malnutrition of moderate degree (05/09/2021), Peripheral arterial disease ([place]), and Pneumonia due to COVID-19 virus (05/29/2021).  Surgical History:   Past Surgical History:  Procedure Laterality Date   ABDOMINAL AORTOGRAM W/LOWER EXTREMITY N/A 08/05/2020   Procedure: ABDOMINAL AORTOGRAM W/LOWER EXTREMITY;  Surgeon: Marty Heck, MD;  Location: [facility];  Service: Cardiovascular;  Laterality: N/A;   ABDOMINAL AORTOGRAM W/LOWER EXTREMITY N/A 11/13/2020   Procedure: ABDOMINAL AORTOGRAM W/LOWER EXTREMITY;  Surgeon: Cherre Robins, MD;  Location: [facility];  Service: Cardiovascular;  Laterality: N/A;   ABDOMINAL AORTOGRAM W/LOWER EXTREMITY N/A 05/12/2021   Procedure: ABDOMINAL AORTOGRAM W/LOWER EXTREMITY;  Surgeon: Waynetta Sandy, MD;  Location: [facility];  Service: Cardiovascular;  Laterality: N/A;    AMPUTATION Left 09/28/2019   Procedure: AMPUTATION BELOW KNEE;  Surgeon: Newt Minion, MD;  Location: [facility];  Service: Orthopedics;  Laterality: Left;   AMPUTATION Right 11/15/2020   Procedure: RIGHT GREAT TOE AMPUTATION;  Surgeon: Newt Minion, MD;  Location: [facility];  Service: Orthopedics;  Laterality: Right;   AMPUTATION Right 08/26/2021   Procedure: AMPUTATION 2nd TOE;  Surgeon: Waynetta Sandy, MD;  Location: [facility];  Service: Vascular;  Laterality: Right;   CARDIAC CATHETERIZATION     CARDIOVERSION N/A 10/05/2019   Procedure: CARDIOVERSION;  Surgeon: Sanda Klein, MD;  Location: [facility];  Service: Cardiovascular;  Laterality: N/A;   FEMORAL-POPLITEAL BYPASS GRAFT Right 08/07/2020   Procedure: RIGHT FEMORAL TO BELOW KNEE POPLITEAL ARTERY BYPASS;  Surgeon: Waynetta Sandy, MD;  Location: [facility];  Service: Vascular;  Laterality: Right;   LEFT HEART CATH AND CORONARY ANGIOGRAPHY N/A 10/03/2019   Procedure: LEFT HEART CATH AND CORONARY ANGIOGRAPHY;  Surgeon: Lorretta Harp, MD;  Location: [facility];  Service: Cardiovascular;  Laterality: N/A;   PERIPHERAL VASCULAR INTERVENTION Right 08/05/2020   Procedure: PERIPHERAL VASCULAR INTERVENTION;  Surgeon: Marty Heck, MD;  Location: [facility];  Service: Cardiovascular;  Laterality: Right;  common Iliac   PERIPHERAL VASCULAR INTERVENTION Left 11/13/2020   Procedure: PERIPHERAL VASCULAR INTERVENTION;  Surgeon: Cherre Robins, MD;  Location: [facility];  Service: Cardiovascular;  Laterality: Left;   PERIPHERAL VASCULAR INTERVENTION Right 11/14/2020   Procedure: PERIPHERAL VASCULAR INTERVENTION;  Surgeon: Marty Heck, MD;  Location: [facility];  Service: Cardiovascular;  Laterality: Right;  POP/SFA STENT   PERIPHERAL VASCULAR INTERVENTION  05/12/2021   Procedure: PERIPHERAL VASCULAR INTERVENTION;  Surgeon: Waynetta Sandy, MD;  Location: [facility];  Service:  Cardiovascular;;   RIGHT/LEFT HEART CATH AND CORONARY ANGIOGRAPHY N/A 08/25/2021   Procedure: RIGHT/LEFT HEART CATH AND CORONARY ANGIOGRAPHY;  Surgeon: Troy Sine, MD;  Location: [facility];  Service: Cardiovascular;  Laterality: N/A;   TEE WITHOUT CARDIOVERSION N/A 10/05/2019   Procedure: TRANSESOPHAGEAL ECHOCARDIOGRAM (TEE);  Surgeon: Sanda Klein, MD;  Location: [facility];  Service: Cardiovascular;  Laterality: N/A;    Social History:  reports that he quit smoking about 6 months ago. His smoking use included cigarettes. He started smoking about 10 months ago. He has a 25.00 pack-year smoking history. He has never used smokeless tobacco. He reports current alcohol use of about 6.0 standard drinks of alcohol per week. He reports current drug use. Drugs: Cocaine and Marijuana.  Family History:  His family history includes Alcoholism in his father and mother.  Allergies No Known Allergies  Home Medications  Prior to Admission medications   Medication Sig Start Date End Date Taking? Authorizing Provider  acetaminophen (TYLENOL) 500 MG tablet Take 1,000 mg by mouth every 6 (six) hours as needed for mild pain.   Yes [provider]  atorvastatin (LIPITOR) 80 MG tablet Take 1 tablet (80 mg total) by mouth daily. 08/30/21 04/04/22 Yes Merrily Brittle, DO  carvedilol (COREG) 12.5 MG tablet Take 12.5 mg by mouth daily. 12/18/20  Yes [provider]  clopidogrel (PLAVIX) 75 MG tablet TAKE 1 TABLET EVERY DAY WITH BREAKFAST Patient taking differently: Take 75 mg by mouth daily. 03/17/21  Yes Hensel, Jamal Collin, MD  fluticasone (FLONASE) 50 MCG/ACT nasal spray Place 2 sprays into both nostrils daily. Patient taking differently: Place 2 sprays into both nostrils as needed for allergies. 02/13/21  Yes Simmons-Robinson, Makiera, MD  furosemide (LASIX) 40 MG tablet TAKE 1 TABLET EVERY DAY Patient taking differently: Take 40 mg by mouth daily. 09/01/21  Yes Hensel, Jamal Collin, MD   gabapentin (NEURONTIN) 100 MG capsule Take 100 mg by mouth 2 (two) times daily.   Yes [provider]  insulin detemir (LEVEMIR) 100 UNIT/ML FlexPen Inject 15 Units into the skin daily. 07/26/21  Yes Maxwell, Allee, MD  ipratropium-albuterol (DUONEB) 0.5-2.5 (3) MG/3ML SOLN Take 3 mLs by nebulization every 4 (four) hours as needed. 05/19/21  Yes Hensel, Jamal Collin, MD  metFORMIN (GLUCOPHAGE) 1000 MG tablet TAKE 1 TABLET EVERY DAY Patient taking differently: Take 1,000 mg by mouth daily. 09/15/21  Yes Hensel, Jamal Collin, MD  Nutritional Supplements (NUTRI-DRINK PO) Take 1 Bottle by mouth as needed (nutrition). Lactose free nutrition   Yes [provider]  pantoprazole (PROTONIX) 40 MG tablet Take 1 tablet by mouth twice daily 11/03/21  Yes Hensel, Jamal Collin, MD  warfarin (COUMADIN) 2.5 MG tablet Take 1 tablet (2.5 mg total) by mouth daily. Patient taking differently: Take 5 mg by mouth See admin instructions. Take half a tablet (2.5 mg) by mouth daily. 07/29/21 07/29/22 Yes Eppie Gibson, MD  albuterol (VENTOLIN HFA) 108 (90 Base) MCG/ACT inhaler INHALE 2 PUFFS INTO THE LUNGS EVERY 6 (SIX) HOURS AS NEEDED FOR WHEEZING OR SHORTNESS OF BREATH. Patient not taking: Reported on 02/12/2022 04/24/21   [name], MD  enoxaparin (LOVENOX) 100 MG/ML injection Inject 1 mL (100 mg total) into the skin 2 (two) times daily for 10 days. Patient not taking: Reported on 02/12/2022 08/30/21 04/04/22  Merrily Brittle, DO  Fluticasone-Umeclidin-Vilant (TRELEGY ELLIPTA) 100-62.5-25 MCG/ACT AEPB Inhale 1 puff into the lungs daily. Patient not taking: Reported on 02/12/2022 06/01/21   Eppie Gibson, MD  glucose blood (RELION TRUE METRIX TEST STRIPS) test strip Use to test blood sugar three times per day. 11/20/19   [name], MD  losartan (COZAAR) 25 MG tablet Take 1/2 tablet (12.5 mg total) by mouth daily. Patient not taking: Reported on 02/12/2022 08/30/21 09/29/21  Merrily Brittle, DO    Idamae Schuller,  MD Tillie Rung. [facility] Internal Medicine Residency, PGY-2

## 2022-02-13 NOTE — Progress Notes (Signed)
RT NOTE: RT placed patient on bipap per MD order due to shortness of breath and increased WOB '@1335'$ . Patient tolerating well at this time. RT will continue to monitor.

## 2022-02-13 NOTE — Progress Notes (Signed)
RT NOTE: RT took patient off bipap and placed patient on [place] for a break off bipap. Patient tolerating well at this time. Per MD place patient on bipap tonight. RT will continue to monitor as needed.

## 2022-02-13 NOTE — Procedures (Signed)
Extubation Procedure Note  Patient Details:   Name: Rodney [name] DOB: 1950/05/25 MRN: [id number]   Airway Documentation:    Vent end date: 02/13/22 Vent end time: 0925   Evaluation  O2 sats: stable throughout Complications: No apparent complications Patient did tolerate procedure well. Bilateral Breath Sounds: Clear, Diminished   Yes  RT extubated patient to 2L [place] per MD order with RN at bedside. Positive cuff leak noted. Patient tolerated well. No stridor noted at this time. RT will continue to monitor as needed.   Fabiola Backer 02/13/2022, 9:29 AM

## 2022-02-13 NOTE — Progress Notes (Addendum)
ANTICOAGULATION CONSULT NOTE - Initial Consult  Pharmacy Consult for warfarin Indication: atrial fibrillation  No Known Allergies  Patient Measurements: Height: '5\' 10"'$  (177.8 cm) Weight: 93.3 kg (205 lb 11 oz) IBW/kg (Calculated) : 73  Vital Signs: Temp: 99.1 F (37.3 C) (10/20 0925) Temp Source: Bladder (10/20 0800) BP: 148/65 (10/20 0830) Pulse Rate: 101 (10/20 0925)  Labs: Recent Labs    02/12/22 0519 02/12/22 0527 02/12/22 0730 02/12/22 0820 02/13/22 0510  HGB 11.7* 11.6*  11.6* 9.2*  --  8.8*  HCT 37.7* 34.0*  34.0* 27.0*  --  25.9*  PLT 325  --   --   --  230  LABPROT 15.7*  --   --   --   --   INR 1.3*  --   --   --   --   CREATININE 1.94* 1.90*  --   --  2.01*  TROPONINIHS 49*  --   --  135*  --     Estimated Creatinine Clearance: 39.2 mL/min (A) (by C-G formula based on SCr of 2.01 mg/dL (H)).   Medical 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   COPD exacerbation ([place]) 12/09/2020   Coronary artery disease    Demand ischemia 03/26/2021   Diabetes mellitus without complication (HCC)    HLD (hyperlipidemia)    Hypertension    Long term (current) use of anticoagulants 12/29/2019   Malnutrition of moderate degree 05/09/2021   Peripheral arterial disease ([place])    Pneumonia due to COVID-19 virus 05/29/2021    Medications:  Medications Prior to Admission  Medication Sig Dispense Refill Last Dose   acetaminophen (TYLENOL) 500 MG tablet Take 1,000 mg by mouth every 6 (six) hours as needed for mild pain.   02/11/2022   atorvastatin (LIPITOR) 80 MG  tablet Take 1 tablet (80 mg total) by mouth daily. 30 tablet 0 02/11/2022   carvedilol (COREG) 12.5 MG tablet Take 12.5 mg by mouth daily.   02/11/2022 at 2100   clopidogrel (PLAVIX) 75 MG tablet TAKE 1 TABLET EVERY DAY WITH BREAKFAST (Patient taking differently: Take 75 mg by mouth daily.) 90 tablet 3 02/11/2022   fluticasone (FLONASE) 50 MCG/ACT nasal spray Place 2 sprays into both nostrils daily. (Patient taking differently: Place 2 sprays into both nostrils as needed for allergies.) 16 g 6 unk   furosemide (LASIX) 40 MG tablet TAKE 1 TABLET EVERY DAY (Patient taking differently: Take 40 mg by mouth daily.) 90 tablet 3 02/11/2022   gabapentin (NEURONTIN) 100 MG capsule Take 100 mg by mouth 2 (two) times daily.   02/11/2022   insulin detemir (LEVEMIR) 100 UNIT/ML FlexPen Inject 15 Units into the skin daily. 15 mL 11 02/11/2022   ipratropium-albuterol (DUONEB) 0.5-2.5 (3) MG/3ML SOLN Take 3 mLs by nebulization every 4 (four) hours as needed. 300 mL 3 02/12/2022   metFORMIN (GLUCOPHAGE) 1000 MG tablet TAKE 1 TABLET EVERY DAY (Patient taking differently: Take 1,000 mg by mouth daily.) 90 tablet 3 02/11/2022   Nutritional Supplements (NUTRI-DRINK PO) Take 1 Bottle by mouth as needed (nutrition). Lactose  free nutrition   02/11/2022   pantoprazole (PROTONIX) 40 MG tablet Take 1 tablet by mouth twice daily 180 tablet 3 02/11/2022   warfarin (COUMADIN) 2.5 MG tablet Take 1 tablet (2.5 mg total) by mouth daily. (Patient taking differently: Take 5 mg by mouth See admin instructions. Take half a tablet (2.5 mg) by mouth daily.) 30 tablet 0 02/11/2022 at 0900   albuterol (VENTOLIN HFA) 108 (90 Base) MCG/ACT inhaler INHALE 2 PUFFS INTO THE LUNGS EVERY 6 (SIX) HOURS AS NEEDED FOR WHEEZING OR SHORTNESS OF BREATH. (Patient not taking: Reported on 02/12/2022) 1 each 0 Not Taking   enoxaparin (LOVENOX) 100 MG/ML injection Inject 1 mL (100 mg total) into the skin 2 (two) times daily for 10 days. (Patient not taking:  Reported on 02/12/2022) 20 mL 0 Not Taking   Fluticasone-Umeclidin-Vilant (TRELEGY ELLIPTA) 100-62.5-25 MCG/ACT AEPB Inhale 1 puff into the lungs daily. (Patient not taking: Reported on 02/12/2022)   Not Taking   glucose blood (RELION TRUE METRIX TEST STRIPS) test strip Use to test blood sugar three times per day. 300 each 3    losartan (COZAAR) 25 MG tablet Take 1/2 tablet (12.5 mg total) by mouth daily. (Patient not taking: Reported on 02/12/2022) 15 tablet 0 Not Taking   Scheduled:   atorvastatin  80 mg Oral Daily   azithromycin  500 mg Oral Daily   Chlorhexidine Gluconate Cloth  6 each Topical Daily   [START ON 02/14/2022] clopidogrel  75 mg Oral Daily   docusate  100 mg Oral BID   enoxaparin (LOVENOX) injection  40 mg Subcutaneous Daily   insulin aspart  0-20 Units Subcutaneous TID WC   insulin aspart  0-5 Units Subcutaneous QHS   insulin detemir  10 Units Subcutaneous Daily   methylPREDNISolone (SOLU-MEDROL) injection  80 mg Intravenous Daily   mupirocin ointment  1 Application Nasal BID   mouth rinse  15 mL Mouth Rinse Q2H   [START ON 02/14/2022] pantoprazole  40 mg Oral Daily   [START ON 02/14/2022] polyethylene glycol  17 g Oral Daily    Assessment: 63 yoM presented with shortness of breath.Currently being treated for COPD exacerbation. Pharmacy consulted to dose warfarin for patient's atrial fibrillation. Patient has been off and on anticoagulation, but confirmed with the patient that he is taking his warfarin. Hgb is trending down from 11.7 to 8.8 with no signs/symptoms of bleeding, plts WNL. Patient on DVT ppx lovenox and per MD d/c the lovenox today. Last INR on 10/19 1.3  Goal of Therapy:  INR 2-3 Monitor platelets by anticoagulation protocol: Yes   Plan:  Initiate warfarin '4mg'$  PO x1 dose (PTA dose 2.'5mg'$  x1.5) Monitor INR daily Monitor CBC/Hgb for further downtrend and for signs/symptoms of bleeding  Sandford Craze, PharmD. [facility] Acute Care PGY-1  02/13/2022  11:43 AM

## 2022-02-14 DIAGNOSIS — J9602 Acute respiratory failure with hypercapnia: Secondary | ICD-10-CM | POA: Diagnosis not present

## 2022-02-14 DIAGNOSIS — I350 Nonrheumatic aortic (valve) stenosis: Secondary | ICD-10-CM | POA: Diagnosis not present

## 2022-02-14 DIAGNOSIS — J441 Chronic obstructive pulmonary disease with (acute) exacerbation: Secondary | ICD-10-CM | POA: Diagnosis not present

## 2022-02-14 DIAGNOSIS — J9601 Acute respiratory failure with hypoxia: Secondary | ICD-10-CM | POA: Diagnosis not present

## 2022-02-14 LAB — GLUCOSE, CAPILLARY
Glucose-Capillary: 141 mg/dL — ABNORMAL HIGH (ref 70–99)
Glucose-Capillary: 122 mg/dL — ABNORMAL HIGH (ref 70–99)
Glucose-Capillary: 128 mg/dL — ABNORMAL HIGH (ref 70–99)
Glucose-Capillary: 177 mg/dL — ABNORMAL HIGH (ref 70–99)
Glucose-Capillary: 212 mg/dL — ABNORMAL HIGH (ref 70–99)
Glucose-Capillary: 188 mg/dL — ABNORMAL HIGH (ref 70–99)

## 2022-02-14 LAB — PROTIME-INR
INR: 1.3 — ABNORMAL HIGH (ref 0.8–1.2)
Prothrombin Time: 15.9 seconds — ABNORMAL HIGH (ref 11.4–15.2)

## 2022-02-14 LAB — URINE CULTURE
Culture: 30000 — AB
Special Requests: NORMAL

## 2022-02-14 LAB — BRAIN NATRIURETIC PEPTIDE: B Natriuretic Peptide: 1439 pg/mL — ABNORMAL HIGH (ref 0.0–100.0)

## 2022-02-14 MED ORDER — WARFARIN SODIUM 4 MG PO TABS
4.0000 mg | ORAL_TABLET | Freq: Once | ORAL | Status: AC
  Administered 2022-02-14: 4 mg via ORAL
  Filled 2022-02-14: qty 1

## 2022-02-14 MED ORDER — FUROSEMIDE 40 MG PO TABS
40.0000 mg | ORAL_TABLET | Freq: Every day | ORAL | Status: DC
  Administered 2022-02-14 – 2022-02-17 (×4): 40 mg via ORAL
  Filled 2022-02-14 (×4): qty 1

## 2022-02-14 NOTE — Evaluation (Addendum)
Physical Therapy Evaluation & Discharge Patient Details Name: Rodney [name] MRN: [id number] DOB: October 28, 1950 Today's Date: 02/14/2022  History of Present Illness  Pt is a 71 y.o. male admitted 02/12/22 with DOE, cough; UDS (+) cocaine, THC. Workup for acute hypoxic respiratory failure, COPD exacerbation, acute pulmonary edema. ETT 10/19-10/20. Of note, pt with 6+ hospital admissions this year. 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   Clinical Impression  Pt presents with an overall decrease in functional mobility secondary to above. PTA, pt reports using w/c for mobility with assist from girlfriend for transfers and various ADL tasks. Today, pt mod indep repositioning self in bed, but adamantly declines OOB mobility despite education and encouragement. Educ on importance of mobility, HEP handout provided. Pt would benefit from continued acute PT services to maximize functional mobility and independence prior to d/c home, but pt currently declines additional acute PT or follow-up PT services, therefore will sign off per pt request. Please reconsult if new needs arise.   SpO2 >/96% on 3.5L O2 [place]   HR 80s (RN reports 10 beat run vtach during session)   Recommendations for follow up therapy are one component of a multi-disciplinary discharge planning process, led by the attending physician.  Recommendations may be updated based on patient status, additional functional criteria and insurance authorization.  Follow Up Recommendations No PT follow up      Assistance Recommended at Discharge Intermittent Supervision/Assistance  Patient can return home with the following  A little help with walking and/or transfers;A little help with bathing/dressing/bathroom;Assistance with cooking/housework;Assist for transportation;Help with stairs or ramp for entrance    Equipment Recommendations None recommended by  PT  Recommendations for Other Services   N/A   Functional Status Assessment       Precautions / Restrictions Precautions Precautions: Fall;Other (comment) Precaution Comments: h/o L BKA (2021); watch HR and SpO2 (wears 3.5L O2 baseline) Restrictions Weight Bearing Restrictions: No      Mobility  Bed Mobility Overal bed mobility: Modified Independent             General bed mobility comments: pt repositionining self mod indep with use of bed rails    Transfers                   General transfer comment: pt adamantly declines OOB mobility    Ambulation/Gait                  Stairs            Wheelchair Mobility    Modified Rankin (Stroke Patients Only)       Balance                                             Pertinent Vitals/Pain Pain Assessment Pain Assessment: No/denies pain Pain Intervention(s): Monitored during session    Home Living Family/patient expects to be discharged to:: Private residence Living Arrangements: Spouse/significant other (girlfriend) Available Help at Discharge: Friend(s);Available 24 hours/day Type of Home: Mobile home Home Access: Ramped entrance       Home Layout: One level Home Equipment: Wheelchair - manual;Tub bench;BSC/3in1;Cane - single point;Rolling Walker (2 wheels);Hospital bed Additional Comments: wears 3.5L O2 baseline    Prior Function Prior Level of Function : Needs assist  Mobility Comments: pt reports primarily using w/c for all mobility, scooting or squat pivot transfers with assist from girlfriend; assist for car transfers ADLs Comments: girlfriend assists with ADL tasks as needed, including bathing     Hand Dominance        Extremity/Trunk Assessment   Upper Extremity Assessment Upper Extremity Assessment: Overall WFL for tasks assessed    Lower Extremity Assessment Lower Extremity Assessment: RLE deficits/detail;LLE deficits/detail RLE  Deficits / Details: h/o R 1-2 toe amputations (most recently s/p 2nd toe amputation 08/2021), observed functional strength >3/5 in bed LLE Deficits / Details: h/o L BKA; observed functional strength >/3/5       Communication   Communication: No difficulties  Cognition Arousal/Alertness: Awake/alert Behavior During Therapy: WFL for tasks assessed/performed Overall Cognitive Status: Within Functional Limits for tasks assessed                                 General Comments: WFL for simple tasks, not formally assessed. seems irritated by questions and conversation; pt reports being "ill" from lack of sleep, adamant against OOB mobility, declines need for further physical therapy while admitted or for follow-up        General Comments General comments (skin integrity, edema, etc.): SpO2 99% on 4L O2 [place]; maintaining >/96% on 3.5L O2 [place] (which pt reports wearing baseline). RN reports 10 beat run vtach during bed mobility, HR 80s. increased time discussing importance of OOB mobility, which pt declines; pt also declines further acute PT, or need for follow-up PT services; pt reports all DME in good shape, no DME needs and will have necessary assist from girlfriend    Exercises Other Exercises Other Exercises: Shelly handout provided (Access Code [email or web address]) - SLR, single leg bridge, LAQ, chair push-ups - pt declined to perform   Assessment/Plan    PT Assessment Patient does not need any further PT services (pt declined)  PT Problem List         PT Treatment Interventions      PT Goals (Current goals can be found in the Care Plan section)  Acute Rehab PT Goals PT Goal Formulation: All assessment and education complete, DC therapy    Frequency       Co-evaluation               AM-PAC PT "6 Clicks" Mobility  Outcome Measure Help needed turning from your back to your side while in a flat bed without using bedrails?: None Help needed moving from lying on your  back to sitting on the side of a flat bed without using bedrails?: A Little Help needed moving to and from a bed to a chair (including a wheelchair)?: A Little Help needed standing up from a chair using your arms (e.g., wheelchair or bedside chair)?: Total Help needed to walk in hospital room?: Total Help needed climbing 3-5 steps with a railing? : Total 6 Click Score: 13    End of Session Equipment Utilized During Treatment: Oxygen Activity Tolerance: Patient limited by fatigue Patient left: in bed;with call bell/phone within reach Nurse Communication: Mobility status PT Visit Diagnosis: Other abnormalities of gait and mobility (R26.89)    Time: 1350-1408 PT Time Calculation (min) (ACUTE ONLY): 18 min   Charges:   PT Evaluation $PT Eval Moderate Complexity: 1 Mod        Mabeline Caras, PT, DPT Acute Rehabilitation Services  Personal: [facility] Office: [phone]  Derry Lory 02/14/2022, 2:53 PM

## 2022-02-14 NOTE — Progress Notes (Addendum)
NAME:  Rodney [name], MRN:  [id number], DOB:  [birth year], LOS: 2 ADMISSION DATE:  02/12/2022, CONSULTATION DATE:  02/12/22 REFERRING MD:  EDP CHIEF COMPLAINT:  Respiratory Failure   History of Present Illness:  71 year old male with hx below presenting to the ED with dyspnea on exertion, and cough. He was intubated in the ED and transferred to the ICU for further care. Has had multiple hospitalizations 6 this year with the the last one in 09/03/21 for COPD exacerbation. He underwent pre-op evaluation for TAVR at that time as well. Pt's wife did not report any symptoms suggestive of infection such as fevers, chills, nausea or vomiting.   UDS positive for cocaine and THC  Pertinent  Medical History  COPD, HTN, HLD, CAD, PAF, DMII, CDKIII, HFrEF, Severe Aortic Stenosis, hx of left BKA, ascending aortic root dilation   Significant Hospital Events: Including procedures, antibiotic start and stop dates in addition to other pertinent events    ETT 10/19 -10/20 10/20 placed on BiPAP postextubation  Interim History / Subjective:   Remains on nasal cannula overnight but placed back on BiPAP this morning when he wanted to sleep. Afebrile   Objective:   Blood pressure (!) 140/83, pulse 83, temperature (!) 97.5 F (36.4 C), temperature source Oral, resp. rate (!) 23, height '5\' 10"'$  (1.778 m), weight 93.3 kg, SpO2 (!) 82 %.      Intake/Output Summary (Last 24 hours) at 02/14/2022 1323 Last data filed at 02/14/2022 1300 Gross per 24 hour  Intake 640.06 ml  Output 2950 ml  Net -2309.94 ml    Filed Weights   02/12/22 0522 02/13/22 0450  Weight: 88.5 kg 93.3 kg   Examination: General: Elderly man, lying supine on BiPAP, no distress HENT: No JVD, mild pallor, no icterus Lungs: coarse breath sounds. Breaths sounds are decreased bilaterally.  Cardiovascular: sinus rhythm, ESM 3/6 over base Abdomen: No TTP, soft abdomen Extremities: left BKA, right foot with partial  amputation Neuro: alert and oriented GU: no foley  Labs show no leukocytosis, stable anemia, Chest x-ray shows cardiomegaly, bilateral pulm vascular congestion and small effusions  Consults   Resolved Hospital Problem list    Assessment & Plan:  Acute hypoxic and hypercarbic respiratory failure  COPD exacerbation Acute pulmonary edema in the setting of cocaine use  -Continue abx for 5 days -Continue steroids for 5 days -Continue duobnebs -Use BiPAP on a as needed basis  AKI on CKDIIIa -Baseline Creatinine around 1.5.  Anemia of chronic disease  -Monitor BMET -Resume Lasix 10/22   DMII Chronic. On insulin and metformin at home. Will continue SSI here. CBG 160s-200s. On Levemir 10 units daily and moderate coverage so will increase to resistant and will add home dose once pt is tolerating diet.   HLD, PAD, CAD Chronic. On lipitor and plavix at home.   HFpEF HTN Paroxysmal Atrial Fibrillation Severe Aortic Stenosis Chronic.  EF is 60 %. LV with moderate concentric hypertrophy -Continue Coumadin -Holding Coreg  Cocaine and THC use -Counseling  Best Practice (right click and "Reselect all SmartList Selections" daily)  Diet/type: Regular consistency (see orders) DVT prophylaxis: Coumadin GI prophylaxis: PPI Lines: N/A Foley:  N/A Continuous: Fent, Prop  Code Status:  DNR Last date of multidisciplinary goals of care discussion [10/20]   CBC: Recent Labs  Lab 02/12/22 0509 02/12/22 0519 02/12/22 0527 02/12/22 0730 02/13/22 0510  WBC  --  16.9*  --   --  10.3  NEUTROABS  --  10.6*  --   --  8.5*  HGB 11.2* 11.7* 11.6*  11.6* 9.2* 8.8*  HCT 33.0* 37.7* 34.0*  34.0* 27.0* 25.9*  MCV  --  96.4  --   --  89.9  PLT  --  325  --   --  433    Basic Metabolic Panel: Recent Labs  Lab 02/12/22 0509 02/12/22 0519 02/12/22 0527 02/12/22 0730 02/13/22 0510  NA 138 138 138  138 137 139  K 5.1 5.0 5.0  5.1 4.9 4.3  CL  --  99 100  --  102  CO2  --  27  --    --  26  GLUCOSE  --  245* 249*  --  170*  BUN  --  30* 36*  --  45*  CREATININE  --  1.94* 1.90*  --  2.01*  CALCIUM  --  9.0  --   --  8.8*    GFR: Estimated Creatinine Clearance: 39.2 mL/min (A) (by C-G formula based on SCr of 2.01 mg/dL (H)). Recent Labs  Lab 02/12/22 0519 02/13/22 0510  WBC 16.9* 10.3    Liver Function Tests: Recent Labs  Lab 02/12/22 0519  AST 16  ALT 11  ALKPHOS 72  BILITOT 0.3  PROT 7.6  ALBUMIN 3.4*    ABG    Component Value Date/Time   PHART 7.366 02/12/2022 0730   PCO2ART 50.1 (H) 02/12/2022 0730   PO2ART 96 02/12/2022 0730   HCO3 28.7 (H) 02/12/2022 0730   TCO2 30 02/12/2022 0730   ACIDBASEDEF 1.0 02/12/2022 0527   O2SAT 97 02/12/2022 0730    Coagulation Profile: Recent Labs  Lab 02/12/22 0519 02/14/22 0950  INR 1.3* 1.3*     HbA1c, POC (controlled diabetic range)  Date/Time Value Ref Range Status  10/14/2020 11:30 AM 6.1 0.0 - 7.0 % Final  09/21/2019 02:51 PM 8.7 (A) 0.0 - 7.0 % Final   Hgb A1c MFr Bld  Date/Time Value Ref Range Status  08/20/2021 02:35 AM 5.9 (H) 4.8 - 5.6 % Final    Comment:    (NOTE) Pre diabetes:          5.7%-6.4%  Diabetes:              >6.4%  Glycemic control for   <7.0% adults with diabetes   03/24/2021 07:03 AM 6.9 (H) 4.8 - 5.6 % Final    Comment:    (NOTE)         Prediabetes: 5.7 - 6.4         Diabetes: >6.4         Glycemic control for adults with diabetes: <7.0     My independent critical care time was 32 minutes   Kara Mead MD. FCCP. [facility] Pager : [phone]  If no response to pager , please call [phone] until 7 pm After 7:00 pm call Elink  [phone]   02/14/2022

## 2022-02-14 NOTE — Consult Note (Addendum)
Cardiology Consultation   Patient ID: Rodney [name] MRN: [id number]; DOB: 1951/02/06  Admit date: 02/12/2022 Date of Consult: 02/14/2022  PCP:  [name], MD   [place] [facility] Cardiologist:  Evalina Field, MD        Patient Profile:   Rodney [name] is a 71 y.o. 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 Currently admitted for acute respiratory failure requiring intubation and cardiology consulted for severe AS and candidacy for TAVR.   History of Present Illness:   Has had multiple hospitalizations (at least six this year) with the the last one in 09/03/21 for COPD exacerbation.   He was admitted 4/25-08/31/21 for acute on chronic respiratory failure with hypoxia and hypercapnia 2/2 AECOPD and acute CHF. Echo during this admission showed EF 55%, severe AS with a mean grad 43 mmHg, peak grad 74 mmHg, AVA 0.82 cm2, DVI 0.22, SVI 30, and small pericardial effusion. [facility] 08/25/21 showed multivessel CAD with coronary calcification.  The LAD has mild 20% proximal narrowing and then has diffuse 40 to 50% mid stenoses after the first diagonal vessel; the left circumflex vessel gives rise to a high marginal branch which bifurcates and extends to the apex and has diffuse 60% proximal to mid stenosis, the AV groove circumflex after OM1 is segmentally narrowed at 60%; the RCA has 70% proximal stenosis and then has previously noted chronic total occlusion.  There is faint collateralization to a portion of the  distal RCA from the conus branch and faint collateralization from the LAD to the PDA and PLA system. There was moderate elevation of right heart pressures with moderate pulmonary artery stenosis with mean PA pressure 35 mm as well as a calcified aortic valve with mean gradient of 34 mmHg, peak to peak 36 mm Hg with at least moderate stenosis and a severely calcified mitral annulus.   He was seen by the multidisciplinary valve team during this admission and felt to be a marginal candidate for TAVR but not prohibitive. Pre TAVR CTs showed anatomy suitable for a 29 mm Edwards S3UR via the left subclavian approach. Additionally, CTs showed enlarged mediastinal lymph nodes as noted on a previous study which recommended a follow up PET scan in 4-6 weeks. It was felt that he would require amputation of gangrenous right second toe in order to be considered for TAVR and this was successfully completed on 08/26/21. He did follow up with [facility] and this was healing appropriately. Additionally, orthopantogram showed poor dentition and it was recommended he proceed with dental evaluation and likely extraction to mitigate risk of bacterial endocarditis.   The patient had several follow up appointments made with cardiology but no showed to all of the apts. He never saw a dentist.   He was then readmitted 10/19 for acute respiratory failure requiring intubation. He is now extubated. Being treated with Abx, steroids and nebs. UDS + for cocaine and THC. Repeat echo this admission showed EF 60-65%, moderate LVH with severe AS with a mean gradient of 29 mm hg, AVA 0.7, DI 0.18, mild  AI as well as severe MAC with mild MR.   Cardiology is consulted for consideration of TAVR for aortic stenosis.  The patient is well-known to the structural heart team and has been followed for some time now.  He has been noncompliant and does not show up to any of his follow-up appointments.  The patient reports being unaware of any follow-up  appointments.  He will not see a dentist as he cannot afford any dental work or extractions.  He reports persistent shortness of breath that is slightly improved from admission.  He denies lower extremity edema orthopnea.  He admits to using cocaine and has no plans to quit stating " I enjoy it and will continue to use it if offered to me".  He reports having used cocaine right before this admission. He is wheelchair bound and does not walk.   Past Medical History:  Diagnosis Date   Acute combined systolic and diastolic congestive heart failure (HCC)    Acute on chronic heart failure with preserved ejection fraction (HFpEF) (HCC)    Acute respiratory failure with hypoxia (HCC)    Acute respiratory failure with hypoxia (HCC)    Aortic stenosis    moderate in 2022   Atrial fibrillation (HCC)    CHF (congestive heart failure) (HCC)    Chronic pleural effusion, Left 11/16/2020   COPD exacerbation ([place]) 12/09/2020   Coronary artery disease    Demand ischemia 03/26/2021   Diabetes mellitus without complication (HCC)    HLD (hyperlipidemia)    Hypertension    Long term (current) use of anticoagulants 12/29/2019   Malnutrition of moderate degree 05/09/2021   Peripheral arterial disease ([place])    Pneumonia due to COVID-19 virus 05/29/2021    Past Surgical History:  Procedure Laterality Date   ABDOMINAL AORTOGRAM W/LOWER EXTREMITY N/A 08/05/2020   Procedure: ABDOMINAL AORTOGRAM W/LOWER EXTREMITY;  Surgeon: Marty Heck, MD;  Location: [place] [facility];  Service: Cardiovascular;  Laterality: N/A;   ABDOMINAL AORTOGRAM W/LOWER EXTREMITY N/A 11/13/2020   Procedure: ABDOMINAL AORTOGRAM W/LOWER EXTREMITY;  Surgeon: Cherre Robins, MD;  Location: [place] [facility];  Service: Cardiovascular;  Laterality: N/A;   ABDOMINAL AORTOGRAM W/LOWER EXTREMITY N/A 05/12/2021   Procedure: ABDOMINAL AORTOGRAM W/LOWER EXTREMITY;  Surgeon: Waynetta Sandy, MD;  Location: [place] [facility];   Service: Cardiovascular;  Laterality: N/A;   AMPUTATION Left 09/28/2019   Procedure: AMPUTATION BELOW KNEE;  Surgeon: Newt Minion, MD;  Location: [place];  Service: Orthopedics;  Laterality: Left;   AMPUTATION Right 11/15/2020   Procedure: RIGHT GREAT TOE AMPUTATION;  Surgeon: Newt Minion, MD;  Location: [place];  Service: Orthopedics;  Laterality: Right;   AMPUTATION Right 08/26/2021   Procedure: AMPUTATION 2nd TOE;  Surgeon: Waynetta Sandy, MD;  Location: [place];  Service: Vascular;  Laterality: Right;   CARDIAC CATHETERIZATION     CARDIOVERSION N/A 10/05/2019   Procedure: CARDIOVERSION;  Surgeon: Sanda Klein, MD;  Location: [place] [facility];  Service: Cardiovascular;  Laterality: N/A;   FEMORAL-POPLITEAL BYPASS GRAFT Right 08/07/2020   Procedure: RIGHT FEMORAL TO BELOW KNEE POPLITEAL ARTERY BYPASS;  Surgeon: Waynetta Sandy, MD;  Location: [place];  Service: Vascular;  Laterality: Right;   LEFT HEART CATH AND CORONARY ANGIOGRAPHY N/A 10/03/2019   Procedure: LEFT HEART CATH AND CORONARY ANGIOGRAPHY;  Surgeon: Lorretta Harp, MD;  Location: [place] [facility];  Service: Cardiovascular;  Laterality: N/A;   PERIPHERAL VASCULAR INTERVENTION Right 08/05/2020   Procedure: PERIPHERAL VASCULAR  INTERVENTION;  Surgeon: Marty Heck, MD;  Location: [place] [facility];  Service: Cardiovascular;  Laterality: Right;  common Iliac   PERIPHERAL VASCULAR INTERVENTION Left 11/13/2020   Procedure: PERIPHERAL VASCULAR INTERVENTION;  Surgeon: Cherre Robins, MD;  Location: [place] [facility];  Service: Cardiovascular;  Laterality: Left;   PERIPHERAL VASCULAR INTERVENTION Right 11/14/2020   Procedure: PERIPHERAL VASCULAR INTERVENTION;  Surgeon: Marty Heck, MD;  Location: [place] [facility];  Service: Cardiovascular;  Laterality: Right;  POP/SFA STENT   PERIPHERAL VASCULAR INTERVENTION  05/12/2021   Procedure: PERIPHERAL VASCULAR INTERVENTION;  Surgeon: Waynetta Sandy, MD;   Location: [place] [facility];  Service: Cardiovascular;;   RIGHT/LEFT HEART CATH AND CORONARY ANGIOGRAPHY N/A 08/25/2021   Procedure: RIGHT/LEFT HEART CATH AND CORONARY ANGIOGRAPHY;  Surgeon: Troy Sine, MD;  Location: [place] [facility];  Service: Cardiovascular;  Laterality: N/A;   TEE WITHOUT CARDIOVERSION N/A 10/05/2019   Procedure: TRANSESOPHAGEAL ECHOCARDIOGRAM (TEE);  Surgeon: Sanda Klein, MD;  Location: [facility];  Service: Cardiovascular;  Laterality: N/A;     Home Medications:  Prior to Admission medications   Medication Sig Start Date End Date Taking? Authorizing Provider  acetaminophen (TYLENOL) 500 MG tablet Take 1,000 mg by mouth every 6 (six) hours as needed for mild pain.   Yes [provider]  atorvastatin (LIPITOR) 80 MG tablet Take 1 tablet (80 mg total) by mouth daily. 08/30/21 04/04/22 Yes Merrily Brittle, DO  carvedilol (COREG) 12.5 MG tablet Take 12.5 mg by mouth daily. 12/18/20  Yes [provider]  clopidogrel (PLAVIX) 75 MG tablet TAKE 1 TABLET EVERY DAY WITH BREAKFAST Patient taking differently: Take 75 mg by mouth daily. 03/17/21  Yes Hensel, Jamal Collin, MD  fluticasone (FLONASE) 50 MCG/ACT nasal spray Place 2 sprays into both nostrils daily. Patient taking differently: Place 2 sprays into both nostrils as needed for allergies. 02/13/21  Yes Simmons-Robinson, Makiera, MD  furosemide (LASIX) 40 MG tablet TAKE 1 TABLET EVERY DAY Patient taking differently: Take 40 mg by mouth daily. 09/01/21  Yes Hensel, Jamal Collin, MD  gabapentin (NEURONTIN) 100 MG capsule Take 100 mg by mouth 2 (two) times daily.   Yes [provider]  insulin detemir (LEVEMIR) 100 UNIT/ML FlexPen Inject 15 Units into the skin daily. 07/26/21  Yes Maxwell, Allee, MD  ipratropium-albuterol (DUONEB) 0.5-2.5 (3) MG/3ML SOLN Take 3 mLs by nebulization every 4 (four) hours as needed. 05/19/21  Yes Hensel, Jamal Collin, MD  metFORMIN (GLUCOPHAGE) 1000 MG tablet TAKE 1 TABLET EVERY  DAY Patient taking differently: Take 1,000 mg by mouth daily. 09/15/21  Yes Hensel, Jamal Collin, MD  Nutritional Supplements (NUTRI-DRINK PO) Take 1 Bottle by mouth as needed (nutrition). Lactose free nutrition   Yes [provider]  pantoprazole (PROTONIX) 40 MG tablet Take 1 tablet by mouth twice daily 11/03/21  Yes Hensel, Jamal Collin, MD  warfarin (COUMADIN) 2.5 MG tablet Take 1 tablet (2.5 mg total) by mouth daily. Patient taking differently: Take 5 mg by mouth See admin instructions. Take half a tablet (2.5 mg) by mouth daily. 07/29/21 07/29/22 Yes Eppie Gibson, MD  albuterol (VENTOLIN HFA) 108 (90 Base) MCG/ACT inhaler INHALE 2 PUFFS INTO THE LUNGS EVERY 6 (SIX) HOURS AS NEEDED FOR WHEEZING OR SHORTNESS OF BREATH. Patient not taking: Reported on 02/12/2022 04/24/21   [name], MD  enoxaparin (LOVENOX) 100 MG/ML injection Inject 1 mL (100 mg total) into the skin 2 (two) times daily for 10 days. Patient not taking:  Reported on 02/12/2022 08/30/21 04/04/22  Merrily Brittle, DO  Fluticasone-Umeclidin-Vilant (TRELEGY ELLIPTA) 100-62.5-25 MCG/ACT AEPB Inhale 1 puff into the lungs daily. Patient not taking: Reported on 02/12/2022 06/01/21   Eppie Gibson, MD  glucose blood (RELION TRUE METRIX TEST STRIPS) test strip Use to test blood sugar three times per day. 11/20/19   [name], MD  losartan (COZAAR) 25 MG tablet Take 1/2 tablet (12.5 mg total) by mouth daily. Patient not taking: Reported on 02/12/2022 08/30/21 09/29/21  Merrily Brittle, DO    Inpatient Medications: Scheduled Meds:  atorvastatin  80 mg Oral Daily   Chlorhexidine Gluconate Cloth  6 each Topical Daily   clopidogrel  75 mg Oral Daily   docusate sodium  100 mg Oral BID   furosemide  40 mg Oral Daily   insulin aspart  0-20 Units Subcutaneous TID WC   insulin aspart  0-5 Units Subcutaneous QHS   insulin detemir  10 Units Subcutaneous Daily   mupirocin ointment  1 Application Nasal BID   mouth rinse  15 mL Mouth  Rinse 4 times per day   pantoprazole  40 mg Oral Daily   polyethylene glycol  17 g Oral Daily   predniSONE  40 mg Oral Daily   warfarin  4 mg Oral ONCE-1600   Warfarin - Pharmacist Dosing Inpatient   Does not apply q1600   Continuous Infusions:  sodium chloride Stopped (02/13/22 1259)   cefTRIAXone (ROCEPHIN)  IV Stopped (02/13/22 1826)   PRN Meds: sodium chloride, docusate sodium, etomidate, ipratropium-albuterol, mouth rinse, mouth rinse, polyethylene glycol  Allergies:   No Known Allergies  Social History:   Social History   Socioeconomic History   Marital status: Widowed    Spouse name: Not on file   Number of children: Not on file   Years of education: Not on file   Highest education level: Not on file  Occupational History   Not on file  Tobacco Use   Smoking status: Former    Packs/day: 0.50    Years: 50.00    Total pack years: 25.00    Types: Cigarettes    Start date: 04/09/2021    Quit date: 07/24/2021    Years since quitting: 0.5   Smokeless tobacco: Never  Vaping Use   Vaping Use: Never used  Substance and Sexual Activity   Alcohol use: Yes    Alcohol/week: 6.0 standard drinks of alcohol    Types: 6 Standard drinks or equivalent per week    Comment: 11/07/20 - states he has not drank in 6 months   Drug use: Yes    Types: Cocaine, Marijuana   Sexual activity: Yes    Partners: Female    Comment: monagamous stable relationship  Other Topics Concern   Not on file  Social History Narrative   Not on file   Social Determinants of Health   Financial Resource Strain: Not on file  Food Insecurity: No Food Insecurity (12/18/2021)   Hunger Vital Sign    Worried About Running Out of Food in the Last Year: Never true    Ran Out of Food in the Last Year: Never true  Transportation Needs: Unmet Transportation Needs (08/14/2021)   PRAPARE - Hydrologist (Medical): Yes    Lack of Transportation (Non-Medical): No  Physical Activity: Not  on file  Stress: Not on file  Social Connections: Not on file  Intimate Partner Violence: Not on file    Family History:  Family History  Problem Relation Age of Onset   Alcoholism Mother    Alcoholism Father      ROS:  Please see the history of present illness.   All other ROS reviewed and negative.     Physical Exam/Data:   Vitals:   02/14/22 1100 02/14/22 1200 02/14/22 1300 02/14/22 1400  BP: 124/67 (!) 147/86 (!) 140/83 (!) 130/95  Pulse: 67 63 83 82  Resp: 18 (!) 24 (!) 23 17  Temp:      TempSrc:      SpO2: 99% 97% (!) 82% 99%  Weight:      Height:        Intake/Output Summary (Last 24 hours) at 02/14/2022 1534 Last data filed at 02/14/2022 1400 Gross per 24 hour  Intake 640.06 ml  Output 3050 ml  Net -2409.94 ml      02/13/2022    4:50 AM 02/12/2022    5:22 AM 10/14/2021   10:01 AM  Last 3 Weights  Weight (lbs) 205 lb 11 oz 195 lb 1.7 oz 195 lb  Weight (kg) 93.3 kg 88.5 kg 88.451 kg     Body mass index is 29.51 kg/m.  General: Chronically ill-appearing HEENT: normal Neck: no JVD Cardiac:  normal S1, S2; RRR; harsh 3 out of 6 systolic ejection murmur Lungs:  clear to auscultation bilaterally, no wheezing, rhonchi or rales  Abd: soft, nontender, no hepatomegaly  Ext: no edema Musculoskeletal: Status post left BKA.  Status post several toe amputations and right foot Skin: warm and dry  Neuro:  CNs 2-12 intact, no focal abnormalities noted Psych:  Normal affect   EKG:  The EKG was personally reviewed and demonstrates: sinus, HR 87  Relevant CV Studies: Echo 02/12/22 IMPRESSIONS   1. Heavily calcified aortic valve with severe AS (mean gradient 29 mmHg;  AVA 0.7 cm2; DI 0.18); mean gradient 06/23/21 documented at 43 mmHg.   2. Left ventricular ejection fraction, by estimation, is 60 to 65%. The  left ventricle has normal function. The left ventricle has no regional  wall motion abnormalities. The left ventricular internal cavity size was  mildly  dilated. There is moderate  concentric left ventricular hypertrophy. Left ventricular diastolic  function could not be evaluated. Elevated left atrial pressure.   3. Right ventricular systolic function is normal. The right ventricular  size is normal.   4. Left atrial size was moderately dilated.   5. Right atrial size was mildly dilated.   6. The mitral valve is normal in structure. Mild mitral valve  regurgitation. No evidence of mitral stenosis. Severe mitral annular  calcification.   7. The aortic valve is calcified. Aortic valve regurgitation is mild.  Severe aortic valve stenosis.   8. Aortic dilatation noted. There is borderline dilatation of the  ascending aorta, measuring 39 mm.   9. The inferior vena cava is normal in size with greater than 50%  respiratory variability, suggesting right atrial pressure of 3 mmHg.   Laboratory Data:  High Sensitivity Troponin:   Recent Labs  Lab 02/12/22 0519 02/12/22 0820  TROPONINIHS 49* 135*     Chemistry Recent Labs  Lab 02/12/22 0519 02/12/22 0527 02/12/22 0730 02/13/22 0510  NA 138 138  138 137 139  K 5.0 5.0  5.1 4.9 4.3  CL 99 100  --  102  CO2 27  --   --  26  GLUCOSE 245* 249*  --  170*  BUN 30* 36*  --  45*  CREATININE  1.94* 1.90*  --  2.01*  CALCIUM 9.0  --   --  8.8*  GFRNONAA 37*  --   --  35*  ANIONGAP 12  --   --  11    Recent Labs  Lab 02/12/22 0519  PROT 7.6  ALBUMIN 3.4*  AST 16  ALT 11  ALKPHOS 72  BILITOT 0.3   Lipids  Recent Labs  Lab 02/13/22 0510  TRIG 132    Hematology Recent Labs  Lab 02/12/22 0519 02/12/22 0527 02/12/22 0730 02/13/22 0510  WBC 16.9*  --   --  10.3  RBC 3.91*  --   --  2.88*  HGB 11.7* 11.6*  11.6* 9.2* 8.8*  HCT 37.7* 34.0*  34.0* 27.0* 25.9*  MCV 96.4  --   --  89.9  MCH 29.9  --   --  30.6  MCHC 31.0  --   --  34.0  RDW 13.0  --   --  13.1  PLT 325  --   --  230   Thyroid No results for input(s): "TSH", "FREET4" in the last 168 hours.  BNP Recent  Labs  Lab 02/12/22 0519  BNP 475.3*    DDimer No results for input(s): "DDIMER" in the last 168 hours.   Radiology/Studies:  ECHOCARDIOGRAM COMPLETE  Result Date: 02/12/2022    ECHOCARDIOGRAM REPORT   Patient Name:   Rodney [name] Date of Exam: 02/12/2022 Medical Rec #:  [id number]         Height:       70.0 in Accession #:    [id number]        Weight:       195.1 lb Date of Birth:  1951-03-16        BSA:          2.065 m Patient Age:    10 years          BP:           129/66 mmHg Patient Gender: M                 HR:           74 bpm. Exam Location:  Inpatient Procedure: 2D Echo, Cardiac Doppler and Color Doppler Indications:    Dyspnea  History:        Patient has prior history of Echocardiogram examinations, most                 recent 06/23/2021. CAD, COPD, Aortic Valve Disease; Risk                 Factors:Hypertension, Dyslipidemia and Diabetes.  Sonographer:    Clayton Lefort RDCS (AE) Referring Phys: [id number] Francesca Jewett  Sonographer Comments: Echo performed with patient supine and on artificial respirator. IMPRESSIONS  1. Heavily calcified aortic valve with severe AS (mean gradient 29 mmHg; AVA 0.7 cm2; DI 0.18); mean gradient 06/23/21 documented at 43 mmHg.  2. Left ventricular ejection fraction, by estimation, is 60 to 65%. The left ventricle has normal function. The left ventricle has no regional wall motion abnormalities. The left ventricular internal cavity size was mildly dilated. There is moderate concentric left ventricular hypertrophy. Left ventricular diastolic function could not be evaluated. Elevated left atrial pressure.  3. Right ventricular systolic function is normal. The right ventricular size is normal.  4. Left atrial size was moderately dilated.  5. Right atrial size was mildly dilated.  6. The mitral valve is normal in structure.  Mild mitral valve regurgitation. No evidence of mitral stenosis. Severe mitral annular calcification.  7. The aortic valve is calcified. Aortic  valve regurgitation is mild. Severe aortic valve stenosis.  8. Aortic dilatation noted. There is borderline dilatation of the ascending aorta, measuring 39 mm.  9. The inferior vena cava is normal in size with greater than 50% respiratory variability, suggesting right atrial pressure of 3 mmHg. FINDINGS  Left Ventricle: Left ventricular ejection fraction, by estimation, is 60 to 65%. The left ventricle has normal function. The left ventricle has no regional wall motion abnormalities. The left ventricular internal cavity size was mildly dilated. There is  moderate concentric left ventricular hypertrophy. Left ventricular diastolic function could not be evaluated due to mitral annular calcification (moderate or greater). Left ventricular diastolic function could not be evaluated. Elevated left atrial pressure. Right Ventricle: The right ventricular size is normal. Right ventricular systolic function is normal. Left Atrium: Left atrial size was moderately dilated. Right Atrium: Right atrial size was mildly dilated. Pericardium: Trivial pericardial effusion is present. Mitral Valve: The mitral valve is normal in structure. Severe mitral annular calcification. Mild mitral valve regurgitation. No evidence of mitral valve stenosis. MV peak gradient, 5.5 mmHg. The mean mitral valve gradient is 3.0 mmHg. Tricuspid Valve: The tricuspid valve is normal in structure. Tricuspid valve regurgitation is mild . No evidence of tricuspid stenosis. Aortic Valve: The aortic valve is calcified. Aortic valve regurgitation is mild. Severe aortic stenosis is present. Aortic valve mean gradient measures 29.0 mmHg. Aortic valve peak gradient measures 44.6 mmHg. Aortic valve area, by VTI measures 0.67 cm. Pulmonic Valve: The pulmonic valve was normal in structure. Pulmonic valve regurgitation is not visualized. No evidence of pulmonic stenosis. Aorta: Aortic dilatation noted. There is borderline dilatation of the ascending aorta, measuring 39  mm. Venous: IVC assessment for right atrial pressure unable to be performed due to mechanical ventilation. The inferior vena cava is normal in size with greater than 50% respiratory variability, suggesting right atrial pressure of 3 mmHg. IAS/Shunts: No atrial level shunt detected by color flow Doppler. Additional Comments: Heavily calcified aortic valve with severe AS (mean gradient 29 mmHg; AVA 0.7 cm2; DI 0.18); mean gradient 06/23/21 documented at 43 mmHg.  LEFT VENTRICLE PLAX 2D LVIDd:         5.60 cm      Diastology LVIDs:         4.30 cm      LV e' medial:    5.44 cm/s LV PW:         1.30 cm      LV E/e' medial:  19.1 LV IVS:        1.40 cm      LV e' lateral:   7.29 cm/s LVOT diam:     2.20 cm      LV E/e' lateral: 14.3 LV SV:         57 LV SV Index:   28 LVOT Area:     3.80 cm  LV Volumes (MOD) LV vol d, MOD A2C: 97.1 ml LV vol d, MOD A4C: 152.0 ml LV vol s, MOD A2C: 42.4 ml LV vol s, MOD A4C: 78.9 ml LV SV MOD A2C:     54.7 ml LV SV MOD A4C:     152.0 ml LV SV MOD BP:      59.4 ml RIGHT VENTRICLE RV Basal diam:  3.70 cm RV Mid diam:    2.80 cm RV S prime:  12.60 cm/s TAPSE (M-mode): 2.5 cm LEFT ATRIUM           Index        RIGHT ATRIUM           Index LA diam:      5.00 cm 2.42 cm/m   RA Area:     28.60 cm LA Vol (A4C): 87.7 ml 42.46 ml/m  RA Volume:   91.10 ml  44.11 ml/m  AORTIC VALVE AV Area (Vmax):    0.72 cm AV Area (Vmean):   0.64 cm AV Area (VTI):     0.67 cm AV Vmax:           334.00 cm/s AV Vmean:          255.000 cm/s AV VTI:            0.850 m AV Peak Grad:      44.6 mmHg AV Mean Grad:      29.0 mmHg LVOT Vmax:         63.10 cm/s LVOT Vmean:        42.700 cm/s LVOT VTI:          0.150 m LVOT/AV VTI ratio: 0.18  AORTA Ao Root diam: 3.70 cm Ao Asc diam:  3.90 cm MITRAL VALVE MV Area (PHT): 2.95 cm       SHUNTS MV Area VTI:   1.50 cm       Systemic VTI:  0.15 m MV Peak grad:  5.5 mmHg       Systemic Diam: 2.20 cm MV Mean grad:  3.0 mmHg MV Vmax:       1.17 m/s MV Vmean:      81.4 cm/s  MV Decel Time: 257 msec MR Peak grad:    113.2 mmHg MR Mean grad:    73.0 mmHg MR Vmax:         532.00 cm/s MR Vmean:        405.0 cm/s MR PISA:         1.01 cm MR PISA Eff ROA: 8 mm MR PISA Radius:  0.40 cm MV E velocity: 104.00 cm/s MV A velocity: 101.00 cm/s MV E/A ratio:  1.03 Kirk Ruths MD Electronically signed by Kirk Ruths MD Signature Date/Time: 02/12/2022/3:59:38 PM    Final    CT Head Wo Contrast  Result Date: 02/12/2022 CLINICAL DATA:  Blunt poly trauma, unresponsive. EXAM: CT HEAD WITHOUT CONTRAST TECHNIQUE: Contiguous axial images were obtained from the base of the skull through the vertex without intravenous contrast. RADIATION DOSE REDUCTION: This exam was performed according to the departmental dose-optimization program which includes automated exposure control, adjustment of the mA and/or kV according to patient size and/or use of iterative reconstruction technique. COMPARISON:  None Available. 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 There is mild-to-moderate cerebral atrophy, mild atrophic ventriculomegaly and moderate to severe small vessel disease the cerebral white matter. Relatively mild cerebellar atrophy. No asymmetry is seen concerning for acute infarct, hemorrhage or mass. There is no midline shift. Basal cisterns are clear. Vascular: Both carotid siphons of the distal left vertebral artery are heavily calcified. No hyperdense central vessel is seen. Skull: No fracture or focal lesion is evident. There is no visible scalp hematoma. Sinuses/Orbits: There is mild membrane thickening in ethmoid sinus. There is a small retention cyst or polyp inferiorly in the left maxillary sinus and mild membrane disease in the  medial right maxillary sinus. The frontal and sphenoid sinus are clear. There is bilateral proptosis and proliferated intraorbital fat but there is no  extraocular muscle thickening. Other: There is trace fluid in right mastoid tip. There is mild patchy fluid in the lower left mastoid air cells. Both middle ear cavities remaining mastoids are clear. There is an S shaped nasal septum. The scout image demonstrates orotracheal and oroenteric intubation. IMPRESSION: 1. No acute intracranial CT findings or depressed skull fractures. 2. Small left mastoid effusion, trace right mastoid fluid. 3. Sinus membrane disease. 4. Chronic right occipital PCA infarct. 5. Atrophy with moderate to severe small vessel disease 6. Proptosis and bilateral proliferated intraorbital fat, which may be seen in the setting of obesity, steroid use, and thyroid ophthalmopathy. There is no thickening of the extraocular muscles. Electronically Signed   By: Telford Nab M.D.   On: 02/12/2022 06:14   DG Chest Portable 1 View  Result Date: 02/12/2022 CLINICAL DATA:  Verify intubation. EXAM: PORTABLE CHEST 1 VIEW COMPARISON:  Portable chest 08/19/2021. FINDINGS: 5:20 a.m. NGT is in place, the tip abutting the far wall of the body of the stomach. ETT is inserted with the tip 6.3 cm from the carina in between the heads of the clavicles. There is cardiomegaly with mild perihilar vascular congestion. Mild interstitial edema seen in the lower zones. There are small layering pleural effusions. Patchy hazy opacities of the right lung perihilar areas and left lower lung field are noted and could be due to ground-glass edema, pneumonitis or combination. The left upper lung field is clear. There is a stable mediastinal configuration with calcification in the aorta. Osteopenia and mild thoracic dextroscoliosis. IMPRESSION: 1. Support tubes as above. 2. Mild perihilar vascular congestion and lower zonal interstitial edema with small pleural effusions. 3. Bilateral lung opacities consistent with edema, pneumonia or combination, right-greater-than-left. 4. Clinical correlation and radiographic follow-up  recommended. Electronically Signed   By: Telford Nab M.D.   On: 02/12/2022 06:05     Assessment and Plan:   Severe AS: repeat echo this admission shows preserved LV function with severe AS, although mean gradient only in 20s (previously 25s).   He was seen by the multidisciplinary valve team during most recent admission and felt to be a marginal candidate for TAVR but not prohibitive. Pre TAVR CTs showed anatomy suitable for a 29 mm Edwards S3UR via the left subclavian approach. Additionally, CTs showed enlarged mediastinal lymph nodes as noted on a previous study which recommended a follow up PET scan in 4-6 weeks. It was felt that he would require amputation of gangrenous right second toe in order to be considered for TAVR and this was successfully completed on 08/26/21. He did follow up with [facility] and this was healing appropriately. Additionally, orthopantogram showed poor dentition and it was recommended he proceed with dental evaluation and likely extraction to mitigate risk of bacterial endocarditis. He has not been able to seen a dentist due to prohibitive cost.   The patient had several follow up appointments made with cardiology but no showed to all of the apts.  He is now readmitted for acute respiratory failure requiring intubation. He is now extubated. Being treated with Abx, steroids and nebs. UDS + for cocaine and THC. Admits to drug use with no plans to quit. I think he is a very poor candidate for TAVR given overall health, poor functional status, high infection risk, severe PAD, noncompliance, and ongoing substance abuse. Will re-discuss with valve team when we  meet next Tuesday for a final disposition.   Acute on chronic diastolic CHF: he does have some evidence of volume overload with elevated BNP and pulm vascular congestion on CXR. Would restart home lasix 34m daily. Creat around his baseline of 2.  Risk Assessment/Risk Scores:        New York Heart Association (NYHA)  Functional Class NYHA Class III  CHA2DS2-VASc Score = 7   This indicates a 11.2% annual risk of stroke. The patient's score is based upon: CHF History: 1 HTN History: 1 Diabetes History: 1 Stroke History: 2 Vascular Disease History: 1 Age Score: 1 Gender Score: 0         For questions or updates, please contact CChums CornerPlease consult [email or web address] for contact info under    Signed, KAngelena Form PA-C  02/14/2022 3:34 PM   Patient seen and examined.  Agree with below documentation.  Mr. BBargeis a 71year old male with a history of severe aortic stenosis, CAD with CTO RCA, severe COPD, CKD stage IIIb, morbid obesity, T2DM, HFpEF, PAD, persistent A-fib/flutter, substance abuse, noncompliance who we are consulted for evaluation of severe aortic stenosis.  He was admitted 10/19 with acute respiratory failure, required intubation.  He has been treated with antibiotics, steroids, and nebulizers.  Notably his urine drug screen on admission was positive for cocaine and THC.  Echocardiogram 02/12/2022 showed severe aortic stenosis (mean gradient 29 mmHg, AVA 0.7, DI 0.18), EF 60 to 65%, moderate LVH, normal RV function, mild MR, mild AI.  On exam, patient is alert and oriented, normal rate, irregular rhythm, 3/6 systolic murmur, diminished breath sounds, no LE edema.  For his severe aortic stenosis, he is a poor candidate for TAVR given his noncompliance and ongoing substance abuse, in addition to his poor functional capacity.  CDonato Heinz MD

## 2022-02-14 NOTE — Progress Notes (Signed)
ANTICOAGULATION CONSULT NOTE  Pharmacy Consult for warfarin Indication: atrial fibrillation  No Known Allergies  Patient Measurements: Height: '5\' 10"'$  (177.8 cm) Weight: 93.3 kg (205 lb 11 oz) IBW/kg (Calculated) : 73  Vital Signs: Temp: 97.5 F (36.4 C) (10/21 0730) Temp Source: Oral (10/21 0730) BP: 145/80 (10/21 1000) Pulse Rate: 70 (10/21 1000)  Labs: Recent Labs    02/12/22 0519 02/12/22 0527 02/12/22 0730 02/12/22 0820 02/13/22 0510 02/14/22 0950  HGB 11.7* 11.6*  11.6* 9.2*  --  8.8*  --   HCT 37.7* 34.0*  34.0* 27.0*  --  25.9*  --   PLT 325  --   --   --  230  --   LABPROT 15.7*  --   --   --   --  15.9*  INR 1.3*  --   --   --   --  1.3*  CREATININE 1.94* 1.90*  --   --  2.01*  --   TROPONINIHS 49*  --   --  135*  --   --      Estimated Creatinine Clearance: 39.2 mL/min (A) (by C-G formula based on SCr of 2.01 mg/dL (H)).   Assessment: 36 yoM presented with shortness of breath from COPD exacerbation. Pharmacy consulted to dose warfarin for patient's atrial fibrillation on 10/20. Patient has been off and on anticoagulation, but confirmed with the patient that he is taking his warfarin.  INR remains sub-therapeutic at 1.3 as expected.  No bleeding reported.  Home warfarin dose:  2.'5mg'$  PO daily  Goal of Therapy:  INR 2-3 Monitor platelets by anticoagulation protocol: Yes   Plan:  Repeat Coumadin '4mg'$  PO today Daily PT / INR  Colbey Wirtanen D. Mina Marble, PharmD, BCPS, [place] 02/14/2022, 11:33 AM

## 2022-02-14 NOTE — Progress Notes (Signed)
Patient placed back on Bipap per patient request to sleep.

## 2022-02-15 DIAGNOSIS — J9601 Acute respiratory failure with hypoxia: Secondary | ICD-10-CM | POA: Diagnosis not present

## 2022-02-15 DIAGNOSIS — I350 Nonrheumatic aortic (valve) stenosis: Secondary | ICD-10-CM

## 2022-02-15 DIAGNOSIS — I5033 Acute on chronic diastolic (congestive) heart failure: Secondary | ICD-10-CM | POA: Diagnosis not present

## 2022-02-15 DIAGNOSIS — I480 Paroxysmal atrial fibrillation: Secondary | ICD-10-CM | POA: Diagnosis not present

## 2022-02-15 LAB — CBC WITH DIFFERENTIAL/PLATELET
Abs Immature Granulocytes: 0.03 10*3/uL (ref 0.00–0.07)
Basophils Absolute: 0 10*3/uL (ref 0.0–0.1)
Basophils Relative: 0 %
Eosinophils Absolute: 0 10*3/uL (ref 0.0–0.5)
Eosinophils Relative: 0 %
HCT: 27.1 % — ABNORMAL LOW (ref 39.0–52.0)
Hemoglobin: 9.2 g/dL — ABNORMAL LOW (ref 13.0–17.0)
Immature Granulocytes: 0 %
Lymphocytes Relative: 13 %
Lymphs Abs: 1.3 10*3/uL (ref 0.7–4.0)
MCH: 31.1 pg (ref 26.0–34.0)
MCHC: 33.9 g/dL (ref 30.0–36.0)
MCV: 91.6 fL (ref 80.0–100.0)
Monocytes Absolute: 0.7 10*3/uL (ref 0.1–1.0)
Monocytes Relative: 7 %
Neutro Abs: 8.1 10*3/uL — ABNORMAL HIGH (ref 1.7–7.7)
Neutrophils Relative %: 80 %
Platelets: 227 10*3/uL (ref 150–400)
RBC: 2.96 MIL/uL — ABNORMAL LOW (ref 4.22–5.81)
RDW: 12.9 % (ref 11.5–15.5)
WBC: 10.2 10*3/uL (ref 4.0–10.5)
nRBC: 0 % (ref 0.0–0.2)

## 2022-02-15 LAB — PROTIME-INR
INR: 1.4 — ABNORMAL HIGH (ref 0.8–1.2)
Prothrombin Time: 17.3 seconds — ABNORMAL HIGH (ref 11.4–15.2)

## 2022-02-15 LAB — GLUCOSE, CAPILLARY
Glucose-Capillary: 140 mg/dL — ABNORMAL HIGH (ref 70–99)
Glucose-Capillary: 144 mg/dL — ABNORMAL HIGH (ref 70–99)
Glucose-Capillary: 190 mg/dL — ABNORMAL HIGH (ref 70–99)
Glucose-Capillary: 200 mg/dL — ABNORMAL HIGH (ref 70–99)

## 2022-02-15 LAB — BASIC METABOLIC PANEL
Anion gap: 10 (ref 5–15)
BUN: 38 mg/dL — ABNORMAL HIGH (ref 8–23)
CO2: 27 mmol/L (ref 22–32)
Calcium: 9 mg/dL (ref 8.9–10.3)
Chloride: 99 mmol/L (ref 98–111)
Creatinine, Ser: 1.48 mg/dL — ABNORMAL HIGH (ref 0.61–1.24)
GFR, Estimated: 51 mL/min — ABNORMAL LOW (ref >60)
Glucose, Bld: 141 mg/dL — ABNORMAL HIGH (ref 70–99)
Potassium: 4.2 mmol/L (ref 3.5–5.1)
Sodium: 136 mmol/L (ref 135–145)

## 2022-02-15 LAB — MAGNESIUM: Magnesium: 1.7 mg/dL (ref 1.7–2.4)

## 2022-02-15 LAB — PHOSPHORUS: Phosphorus: 3.3 mg/dL (ref 2.5–4.6)

## 2022-02-15 MED ORDER — ENOXAPARIN SODIUM 100 MG/ML IJ SOSY
90.0000 mg | PREFILLED_SYRINGE | Freq: Two times a day (BID) | INTRAMUSCULAR | Status: DC
  Administered 2022-02-15 – 2022-02-17 (×5): 90 mg via SUBCUTANEOUS
  Filled 2022-02-15 (×7): qty 0.9

## 2022-02-15 MED ORDER — WARFARIN SODIUM 5 MG PO TABS
5.0000 mg | ORAL_TABLET | Freq: Once | ORAL | Status: AC
  Administered 2022-02-15: 5 mg via ORAL
  Filled 2022-02-15: qty 1

## 2022-02-15 NOTE — Progress Notes (Signed)
PROGRESS NOTE    Rodney [name]  XBD:[id number] DOB: [birth year] DOA: 02/12/2022 PCP: [name], MD   Brief Narrative:  71 year old male with hx of COPD(on room air), HTN, HLD, CAD, PAF, DMII, CDKIII, HFrEF, Severe Aortic Stenosis, hx of left BKA, ascending aortic root dilation presenting to the ED with dyspnea on exertion, and cough. He was intubated in the ED and transferred to the ICU for further care. Has had multiple hospitalizations - 6 this year with the the last one in 09/03/21 for COPD exacerbation. He underwent pre-op evaluation for TAVR at that time as well but does not appear to have followed up in the outpatient setting for treatment. UDS positive for cocaine and THC.   Patient initially admitted to the ICU given need for intubation, extubated 10/20 to BiPAP, moderately BiPAP dependent over the past 48 hours but moderately improving at this time.  Patient unwilling to undergo physical therapy or occupational therapy, will not have his prosthetic delivered to participate in any meaningful way.  We discussed ongoing need for therapy both physical and respiratory to ensure safe discharge home which patient was not interested in.  Discussed that once his respiratory status is back to baseline we could consider discharge home.  Cardiology consulted to evaluate patient given previous work-up for TAVR placement that was not completed due to noncompliance.  Unclear if he is an appropriate candidate for procedure given his ongoing cocaine and THC use.  Appreciate cardiology insight and recommendations   Assessment & Plan:   Principal Problem:   Respiratory failure ([place]) Active Problems:   Acute respiratory failure with hypoxia and hypercarbia (HCC)  Acute hypoxic and hypercarbic respiratory failure  Rule out COPD exacerbation Cannot rule out concurrent symptomatic aortic stenosis Acute pulmonary edema/toxic encephalopathy in the setting of cocaine use -Successfully extubated  02/13/2022 requiring BiPAP initially around-the-clock but able to wean down over the past 24 hours -Severe aortic stenosis likely playing some role in his respiratory distress given orthopnea pulmonary edema is likely at least contributing -Antibiotic course planned x5 days last dose 02/16/2022 -Continue steroids for 5 days -consider taper if respiratory status not improving -Continue nebs -Initially plan for ambulatory oxygen screening but patient is refusing to bring a prosthetic or work with physical therapy or ambulate at this time   Aortic stenosis, severe CAD, history of HFrEF Paroxysmal A-fib, on warfarin -Previously evaluated for TAVR, noted severe aortic stenosis previously -Mean gradient 29 mmHg noted on echo here -Cardiology sidelined for further evaluation, likely will need to discontinue cocaine use and follow-up outpatient for further planning for possible procedure or intervention -Lovenox to cover while titrating warfarin given subtherapeutic INR *Update, given patient's noncompliance with follow-up, illicit substance abuse and multiple risks for infection unwilling to comply with medical recommendations he is likely a very poor candidate for TAVR  AKI on CKDIIIa  -Baseline Creatinine around 1.5.   Anemia of chronic disease -Secondary to CKD 3A -No signs or symptoms of bleeding, INR subtherapeutic as above -Continue Lasix, monitor for volume overload    DMII, uncontrolled with hyperglycemia Chronic. On insulin and metformin at home. Continue SSI here.  Continue to titrate insulin dosing -we will likely require increased insulin regimen at home   HLD, PAD, CAD Chronic. On lipitor and plavix at home.   Cocaine and THC use -Counseled at bedside, may increase patient's risk to preclude him from cardiac evaluation   DVT prophylaxis: Lovenox full dose (warfarin on hold) Code Status: Full Family Communication:  None present  Status is: Inpatient  Dispo: The patient  is from: Home              Anticipated d/c is to: Home              Anticipated d/c date is: 48 to 72 hours pending clinical course and cardiac work-up              Patient currently not medically stable for discharge  Consultants:  CCM, cardiology  Procedures:  Intubation/extubation as above  Antimicrobials:  Ceftriaxone x5 days  Subjective: No acute issues or events overnight, refused BiPAP but respiratory status appears well controlled on nasal cannula  Objective: Vitals:   02/15/22 0350 02/15/22 0400 02/15/22 0500 02/15/22 0600  BP:  (!) 147/93 138/72 (!) 161/80  Pulse: 77 60 72 72  Resp: '20 20 18 20  '$ Temp: 98.7 F (37.1 C)     TempSrc: Oral     SpO2: 98% 95% 96% 93%  Weight:      Height:        Intake/Output Summary (Last 24 hours) at 02/15/2022 0740 Last data filed at 02/15/2022 1610 Gross per 24 hour  Intake 680 ml  Output 2300 ml  Net -1620 ml   Filed Weights   02/12/22 0522 02/13/22 0450  Weight: 88.5 kg 93.3 kg    Examination:  General:  Pleasantly resting in bed, No acute distress. HEENT:  Normocephalic atraumatic.   Lungs: Without overt wheezes rhonchi or rales Heart:  Regular rate and rhythm.  Holosystolic murmur right sternal border. Abdomen:  Soft, nontender, nondistended.  Without guarding or rebound. Extremities: Left BKA, right multiple toe amputations consistent with surgical history  Data Reviewed: I have personally reviewed following labs and imaging studies  CBC: Recent Labs  Lab 02/12/22 0519 02/12/22 0527 02/12/22 0730 02/13/22 0510 02/15/22 0311  WBC 16.9*  --   --  10.3 10.2  NEUTROABS 10.6*  --   --  8.5* 8.1*  HGB 11.7* 11.6*  11.6* 9.2* 8.8* 9.2*  HCT 37.7* 34.0*  34.0* 27.0* 25.9* 27.1*  MCV 96.4  --   --  89.9 91.6  PLT 325  --   --  230 960   Basic Metabolic Panel: Recent Labs  Lab 02/12/22 0519 02/12/22 0527 02/12/22 0730 02/13/22 0510 02/15/22 0311  NA 138 138  138 137 139 136  K 5.0 5.0  5.1 4.9 4.3  4.2  CL 99 100  --  102 99  CO2 27  --   --  26 27  GLUCOSE 245* 249*  --  170* 141*  BUN 30* 36*  --  45* 38*  CREATININE 1.94* 1.90*  --  2.01* 1.48*  CALCIUM 9.0  --   --  8.8* 9.0  MG  --   --   --   --  1.7  PHOS  --   --   --   --  3.3   GFR: Estimated Creatinine Clearance: 53.3 mL/min (A) (by C-G formula based on SCr of 1.48 mg/dL (H)). Liver Function Tests: Recent Labs  Lab 02/12/22 0519  AST 16  ALT 11  ALKPHOS 72  BILITOT 0.3  PROT 7.6  ALBUMIN 3.4*   No results for input(s): "LIPASE", "AMYLASE" in the last 168 hours. No results for input(s): "AMMONIA" in the last 168 hours. Coagulation Profile: Recent Labs  Lab 02/12/22 0519 02/14/22 0950 02/15/22 0311  INR 1.3* 1.3* 1.4*   Cardiac Enzymes: No results for input(s): "  CKTOTAL", "CKMB", "CKMBINDEX", "TROPONINI" in the last 168 hours. BNP (last 3 results) No results for input(s): "PROBNP" in the last 8760 hours. HbA1C: No results for input(s): "HGBA1C" in the last 72 hours. CBG: Recent Labs  Lab 02/14/22 0752 02/14/22 1204 02/14/22 1645 02/14/22 1957 02/14/22 2158  GLUCAP 122* 128* 177* 212* 188*   Lipid Profile: Recent Labs    02/13/22 0510  TRIG 132   Thyroid Function Tests: No results for input(s): "TSH", "T4TOTAL", "FREET4", "T3FREE", "THYROIDAB" in the last 72 hours. Anemia Panel: No results for input(s): "VITAMINB12", "FOLATE", "FERRITIN", "TIBC", "IRON", "RETICCTPCT" in the last 72 hours. Sepsis Labs: No results for input(s): "PROCALCITON", "LATICACIDVEN" in the last 168 hours.  Recent Results (from the past 240 hour(s))  Resp Panel by RT-PCR (Flu A&B, Covid) Anterior Nasal Swab     Status: None   Collection Time: 02/12/22  5:19 AM   Specimen: Anterior Nasal Swab  Result Value Ref Range Status   SARS Coronavirus 2 by RT PCR NEGATIVE NEGATIVE Final    Comment: (NOTE) SARS-CoV-2 target nucleic acids are NOT DETECTED.  The SARS-CoV-2 RNA is generally detectable in upper  respiratory specimens during the acute phase of infection. The lowest concentration of SARS-CoV-2 viral copies this assay can detect is 138 copies/mL. A negative result does not preclude SARS-Cov-2 infection and should not be used as the sole basis for treatment or other patient management decisions. A negative result may occur with  improper specimen collection/handling, submission of specimen other than nasopharyngeal swab, presence of viral mutation(s) within the areas targeted by this assay, and inadequate number of viral copies(<138 copies/mL). A negative result must be combined with clinical observations, patient history, and epidemiological information. The expected result is Negative.  Fact Sheet for Patients:  EntrepreneurPulse.com.au  Fact Sheet for Healthcare Providers:  IncredibleEmployment.be  This test is no t yet approved or cleared by the [place] FDA and  has been authorized for detection and/or diagnosis of SARS-CoV-2 by FDA under an Emergency Use Authorization (EUA). This EUA will remain  in effect (meaning this test can be used) for the duration of the COVID-19 declaration under Section 564(b)(1) of the Act, 21 U.S.C.section 360bbb-3(b)(1), unless the authorization is terminated  or revoked sooner.       Influenza A by PCR NEGATIVE NEGATIVE Final   Influenza B by PCR NEGATIVE NEGATIVE Final    Comment: (NOTE) The Xpert Xpress SARS-CoV-2/FLU/RSV plus assay is intended as an aid in the diagnosis of influenza from Nasopharyngeal swab specimens and should not be used as a sole basis for treatment. Nasal washings and aspirates are unacceptable for Xpert Xpress SARS-CoV-2/FLU/RSV testing.  Fact Sheet for Patients: EntrepreneurPulse.com.au  Fact Sheet for Healthcare Providers: IncredibleEmployment.be  This test is not yet approved or cleared by the [place] FDA and has been  authorized for detection and/or diagnosis of SARS-CoV-2 by FDA under an Emergency Use Authorization (EUA). This EUA will remain in effect (meaning this test can be used) for the duration of the COVID-19 declaration under Section 564(b)(1) of the Act, 21 U.S.C. section 360bbb-3(b)(1), unless the authorization is terminated or revoked.  Performed at [facility], [place] [address] [place], [place] [address]   Urine Culture     Status: Abnormal   Collection Time: 02/12/22  6:14 AM   Specimen: Urine, Catheterized  Result Value Ref Range Status   Specimen Description URINE, CATHETERIZED  Final   Special Requests   Final    Normal Performed at [facility]  [facility], [employer] [address] [place], [place] [address]    Culture (A)  Final    30,000 COLONIES/mL ENTEROCOCCUS FAECALIS VANCOMYCIN RESISTANT ENTEROCOCCUS ISOLATED    Report Status 02/14/2022 FINAL  Final   Organism ID, Bacteria ENTEROCOCCUS FAECALIS (A)  Final      Susceptibility   Enterococcus faecalis - MIC*    AMPICILLIN <=2 SENSITIVE Sensitive     NITROFURANTOIN <=16 SENSITIVE Sensitive     VANCOMYCIN >=32 RESISTANT Resistant     LINEZOLID 2 SENSITIVE Sensitive     * 30,000 COLONIES/mL ENTEROCOCCUS FAECALIS  MRSA Next Gen by PCR, Nasal     Status: Abnormal   Collection Time: 02/12/22 10:56 AM   Specimen: Nasal Mucosa; Nasal Swab  Result Value Ref Range Status   MRSA by PCR Next Gen DETECTED (A) NOT DETECTED Final    Comment: RESULT CALLED TO, READ BACK BY AND VERIFIED WITH: RN F FLINT [id number] AT 1353 BY CM (NOTE) The GeneXpert MRSA Assay (FDA approved for NASAL specimens only), is one component of a comprehensive MRSA colonization surveillance program. It is not intended to diagnose MRSA infection nor to guide or monitor treatment for MRSA infections. Test performance is not FDA approved in patients less than 66 years old. Performed at [facility], [place] [address] [place], [place] [address]           Radiology Studies: No results found.  Scheduled Meds:  atorvastatin  80 mg Oral Daily   Chlorhexidine Gluconate Cloth  6 each Topical Daily   clopidogrel  75 mg Oral Daily   docusate sodium  100 mg Oral BID   furosemide  40 mg Oral Daily   insulin aspart  0-20 Units Subcutaneous TID WC   insulin aspart  0-5 Units Subcutaneous QHS   insulin detemir  10 Units Subcutaneous Daily   mupirocin ointment  1 Application Nasal BID   mouth rinse  15 mL Mouth Rinse 4 times per day   pantoprazole  40 mg Oral Daily   polyethylene glycol  17 g Oral Daily   predniSONE  40 mg Oral Daily   Warfarin - Pharmacist Dosing Inpatient   Does not apply q1600   Continuous Infusions:  sodium chloride Stopped (02/14/22 2000)   cefTRIAXone (ROCEPHIN)  IV Stopped (02/14/22 1840)     LOS: 3 days   Time spent: 91mn  Kilynn Fitzsimmons C Lavert Matousek, DO [facility]  If 7PM-7AM, please contact night-coverage [email or web address]  02/15/2022, 7:40 AM

## 2022-02-15 NOTE — Progress Notes (Signed)
[facility] for Lovenox / Coumadin Indication: atrial fibrillation  No Known Allergies  Patient Measurements: Height: '5\' 10"'$  (177.8 cm) Weight: 93.3 kg (205 lb 11 oz) IBW/kg (Calculated) : 73  Vital Signs: Temp: 98.7 F (37.1 C) (10/22 0350) Temp Source: Oral (10/22 0350) BP: 161/80 (10/22 0600) Pulse Rate: 76 (10/22 0700)  Labs: Recent Labs    02/13/22 0510 02/14/22 0950 02/15/22 0311  HGB 8.8*  --  9.2*  HCT 25.9*  --  27.1*  PLT 230  --  227  LABPROT  --  15.9* 17.3*  INR  --  1.3* 1.4*  CREATININE 2.01*  --  1.48*     Estimated Creatinine Clearance: 53.3 mL/min (A) (by C-G formula based on SCr of 1.48 mg/dL (H)).   Assessment: 13 yoM presented with shortness of breath from COPD exacerbation. Pharmacy consulted to dose warfarin for patient's atrial fibrillation on 10/20. Patient has been off and on anticoagulation, but confirmed with the patient that he is taking his warfarin.  Pharmacy consulted to add Lovenox bridge 10/22 since patient is in Afib with sub-therapeutic INR.  INR remains sub-therapeutic and starting to trend up.  Renal function improving; CBC stable; no bleeding reported.  Home warfarin dose:  2.'5mg'$  PO daily  Goal of Therapy:  INR 2-3 Anti-Xa level 0.6-1 units/ml 4hrs after LMWH dose given Monitor platelets by anticoagulation protocol: Yes   Plan:  Lovenox '90mg'$  SQ Q12H Coumadin '5mg'$  PO today Daily PT / INR Check LMWH level as needed  Escher Harr D. Mina Marble, PharmD, BCPS, [place] 02/15/2022, 9:41 AM

## 2022-02-15 NOTE — Progress Notes (Signed)
Rounding Note    Patient Name: Rodney [name] Date of Encounter: 02/15/2022  [place] Cardiologist: Evalina Field, MD   Subjective   Renal function improved (Cr 2.0>1.5).  BP elevated (161/80).  SpO2 97% on 1L.  Net negative 1.6L yesterday, negative 4.3L on admission.  Reports dyspnea improving  Inpatient Medications    Scheduled Meds:  atorvastatin  80 mg Oral Daily   Chlorhexidine Gluconate Cloth  6 each Topical Daily   clopidogrel  75 mg Oral Daily   docusate sodium  100 mg Oral BID   furosemide  40 mg Oral Daily   insulin aspart  0-20 Units Subcutaneous TID WC   insulin aspart  0-5 Units Subcutaneous QHS   insulin detemir  10 Units Subcutaneous Daily   mupirocin ointment  1 Application Nasal BID   mouth rinse  15 mL Mouth Rinse 4 times per day   pantoprazole  40 mg Oral Daily   polyethylene glycol  17 g Oral Daily   predniSONE  40 mg Oral Daily   Warfarin - Pharmacist Dosing Inpatient   Does not apply q1600   Continuous Infusions:  sodium chloride Stopped (02/14/22 2000)   cefTRIAXone (ROCEPHIN)  IV Stopped (02/14/22 1840)   PRN Meds: sodium chloride, docusate sodium, etomidate, ipratropium-albuterol, mouth rinse, mouth rinse, polyethylene glycol   Vital Signs    Vitals:   02/15/22 0400 02/15/22 0500 02/15/22 0600 02/15/22 0700  BP: (!) 147/93 138/72 (!) 161/80   Pulse: 60 72 72 76  Resp: '20 18 20 '$ (!) 21  Temp:      TempSrc:      SpO2: 95% 96% 93% 97%  Weight:      Height:        Intake/Output Summary (Last 24 hours) at 02/15/2022 0853 Last data filed at 02/15/2022 0700 Gross per 24 hour  Intake 690 ml  Output 2400 ml  Net -1710 ml      02/13/2022    4:50 AM 02/12/2022    5:22 AM 10/14/2021   10:01 AM  Last 3 Weights  Weight (lbs) 205 lb 11 oz 195 lb 1.7 oz 195 lb  Weight (kg) 93.3 kg 88.5 kg 88.451 kg      Telemetry    Afib with rates 70-90s - Personally Reviewed  ECG    No new ECG - Personally Reviewed  Physical  Exam   GEN: No acute distress.   Neck: No JVD Cardiac: irregular, normal ate, 3/6 systolic murmur Respiratory: Scattered wheezing GI: Soft, nontender, non-distended  MS: No edema; S/p L BKA Neuro:  Nonfocal  Psych: Normal affect   Labs    High Sensitivity Troponin:   Recent Labs  Lab 02/12/22 0519 02/12/22 0820  TROPONINIHS 49* 135*     Chemistry Recent Labs  Lab 02/12/22 0519 02/12/22 0527 02/12/22 0730 02/13/22 0510 02/15/22 0311  NA 138 138  138 137 139 136  K 5.0 5.0  5.1 4.9 4.3 4.2  CL 99 100  --  102 99  CO2 27  --   --  26 27  GLUCOSE 245* 249*  --  170* 141*  BUN 30* 36*  --  45* 38*  CREATININE 1.94* 1.90*  --  2.01* 1.48*  CALCIUM 9.0  --   --  8.8* 9.0  MG  --   --   --   --  1.7  PROT 7.6  --   --   --   --   ALBUMIN 3.4*  --   --   --   --  AST 16  --   --   --   --   ALT 11  --   --   --   --   ALKPHOS 72  --   --   --   --   BILITOT 0.3  --   --   --   --   GFRNONAA 37*  --   --  35* 51*  ANIONGAP 12  --   --  11 10    Lipids  Recent Labs  Lab 02/13/22 0510  TRIG 132    Hematology Recent Labs  Lab 02/12/22 0519 02/12/22 0527 02/12/22 0730 02/13/22 0510 02/15/22 0311  WBC 16.9*  --   --  10.3 10.2  RBC 3.91*  --   --  2.88* 2.96*  HGB 11.7*   < > 9.2* 8.8* 9.2*  HCT 37.7*   < > 27.0* 25.9* 27.1*  MCV 96.4  --   --  89.9 91.6  MCH 29.9  --   --  30.6 31.1  MCHC 31.0  --   --  34.0 33.9  RDW 13.0  --   --  13.1 12.9  PLT 325  --   --  230 227   < > = values in this interval not displayed.   Thyroid No results for input(s): "TSH", "FREET4" in the last 168 hours.  BNP Recent Labs  Lab 02/12/22 0519 02/14/22 0950  BNP 475.3* 1,439.0*    DDimer No results for input(s): "DDIMER" in the last 168 hours.   Radiology    No results found.  Cardiac Studies     Patient Profile     71 y.o. 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    He was seen by the multidisciplinary valve team during most recent admission and felt to be a marginal candidate for TAVR but not prohibitive. Pre TAVR CTs showed anatomy suitable for a 29 mm Edwards S3UR via the left subclavian approach. Additionally, CTs showed enlarged mediastinal lymph nodes as noted on a previous study which recommended a follow up PET scan in 4-6 weeks. It was felt that he would require amputation of gangrenous right second toe in order to be considered for TAVR and this was successfully completed on 08/26/21. He did follow up with [facility] and this was healing appropriately. Additionally, orthopantogram showed poor dentition and it was recommended he proceed with dental evaluation and likely extraction to mitigate risk of bacterial endocarditis. He has not been able to see a dentist due to prohibitive cost.    The patient had several follow up appointments made with cardiology but no showed to all of the apts.   He is now readmitted for acute respiratory failure requiring intubation. He is now extubated. Being treated with Abx, steroids and nebs. UDS + for cocaine and THC. Admits to drug use with  no plans to quit. I think he is a poor candidate for TAVR given overall health, poor functional status, high infection risk, severe PAD, noncompliance, and ongoing substance abuse. Plan is to re-discuss with valve team when meet  next Tuesday for a final disposition.    Acute on chronic diastolic CHF: Was receiving IV lasix, now transitioned to home lasix '40mg'$  daily. Creat improving.  CAD: Has occluded RCA with 50 to 60% stenosis in LAD and 60% LCx disease.  Managed medically.  Continue Plavix, atorvastatin  Atrial fibrillation: on warfarin per pharmacy.  INR 1.4.  Currently in Afib, rates well controlled.  Start heparin gtt while INR subtherapeutic  Acute hypoxic/hypercarbic respiratory failure: Required intubation on admission.Treated for COPD exacerbation, on antibiotics/steroids/DuoNebs.  Improving, now weaned to 1L    For questions or updates, please contact [place] Please consult [email or web address] for contact info under        Signed, Donato Heinz, MD  02/15/2022, 8:53 AM

## 2022-02-15 NOTE — Progress Notes (Signed)
Patient refused mobility. States he will "not move to the chair today." Educated on importance of mobility in the hospital. Plan of care continues.

## 2022-02-16 DIAGNOSIS — I5033 Acute on chronic diastolic (congestive) heart failure: Secondary | ICD-10-CM | POA: Diagnosis not present

## 2022-02-16 DIAGNOSIS — I480 Paroxysmal atrial fibrillation: Secondary | ICD-10-CM | POA: Diagnosis not present

## 2022-02-16 DIAGNOSIS — J9601 Acute respiratory failure with hypoxia: Secondary | ICD-10-CM | POA: Diagnosis not present

## 2022-02-16 DIAGNOSIS — I350 Nonrheumatic aortic (valve) stenosis: Secondary | ICD-10-CM | POA: Diagnosis not present

## 2022-02-16 LAB — CBC
HCT: 28 % — ABNORMAL LOW (ref 39.0–52.0)
Hemoglobin: 9.8 g/dL — ABNORMAL LOW (ref 13.0–17.0)
MCH: 30.7 pg (ref 26.0–34.0)
MCHC: 35 g/dL (ref 30.0–36.0)
MCV: 87.8 fL (ref 80.0–100.0)
Platelets: 244 10*3/uL (ref 150–400)
RBC: 3.19 MIL/uL — ABNORMAL LOW (ref 4.22–5.81)
RDW: 12.6 % (ref 11.5–15.5)
WBC: 9.3 10*3/uL (ref 4.0–10.5)
nRBC: 0 % (ref 0.0–0.2)

## 2022-02-16 LAB — BASIC METABOLIC PANEL
Anion gap: 10 (ref 5–15)
BUN: 41 mg/dL — ABNORMAL HIGH (ref 8–23)
CO2: 29 mmol/L (ref 22–32)
Calcium: 8.8 mg/dL — ABNORMAL LOW (ref 8.9–10.3)
Chloride: 96 mmol/L — ABNORMAL LOW (ref 98–111)
Creatinine, Ser: 1.34 mg/dL — ABNORMAL HIGH (ref 0.61–1.24)
GFR, Estimated: 57 mL/min — ABNORMAL LOW (ref >60)
Glucose, Bld: 120 mg/dL — ABNORMAL HIGH (ref 70–99)
Potassium: 3.8 mmol/L (ref 3.5–5.1)
Sodium: 135 mmol/L (ref 135–145)

## 2022-02-16 LAB — GLUCOSE, CAPILLARY
Glucose-Capillary: 106 mg/dL — ABNORMAL HIGH (ref 70–99)
Glucose-Capillary: 127 mg/dL — ABNORMAL HIGH (ref 70–99)
Glucose-Capillary: 158 mg/dL — ABNORMAL HIGH (ref 70–99)
Glucose-Capillary: 180 mg/dL — ABNORMAL HIGH (ref 70–99)

## 2022-02-16 LAB — PROTIME-INR
INR: 1.6 — ABNORMAL HIGH (ref 0.8–1.2)
Prothrombin Time: 19.1 seconds — ABNORMAL HIGH (ref 11.4–15.2)

## 2022-02-16 MED ORDER — INSULIN DETEMIR 100 UNIT/ML ~~LOC~~ SOLN
15.0000 [IU] | Freq: Every day | SUBCUTANEOUS | Status: DC
  Administered 2022-02-17: 15 [IU] via SUBCUTANEOUS
  Filled 2022-02-16: qty 0.15

## 2022-02-16 MED ORDER — WARFARIN SODIUM 5 MG PO TABS
5.0000 mg | ORAL_TABLET | Freq: Once | ORAL | Status: AC
  Administered 2022-02-16: 5 mg via ORAL
  Filled 2022-02-16: qty 1

## 2022-02-16 NOTE — Progress Notes (Signed)
BiPAP at bedside if needed. Pt on nasal cannula and tolerating well at this time

## 2022-02-16 NOTE — Progress Notes (Signed)
Rounding Note    Patient Name: Rodney [name] Date of Encounter: 02/16/2022  [place] Cardiologist: Evalina Field, MD   Subjective   INR rising to 1.6, but remains subtherapeutic on heparin. Creatinine continues to improve, now 1.34 today. BNP was 1439 on 10/21. Net negative 4.7L overall on oral lasix.  Inpatient Medications    Scheduled Meds:  atorvastatin  80 mg Oral Daily   Chlorhexidine Gluconate Cloth  6 each Topical Daily   clopidogrel  75 mg Oral Daily   docusate sodium  100 mg Oral BID   enoxaparin (LOVENOX) injection  90 mg Subcutaneous BID   furosemide  40 mg Oral Daily   insulin aspart  0-20 Units Subcutaneous TID WC   insulin aspart  0-5 Units Subcutaneous QHS   insulin detemir  10 Units Subcutaneous Daily   mupirocin ointment  1 Application Nasal BID   mouth rinse  15 mL Mouth Rinse 4 times per day   pantoprazole  40 mg Oral Daily   polyethylene glycol  17 g Oral Daily   predniSONE  40 mg Oral Daily   warfarin  5 mg Oral ONCE-1600   Warfarin - Pharmacist Dosing Inpatient   Does not apply q1600   Continuous Infusions:  sodium chloride Stopped (02/14/22 2000)   cefTRIAXone (ROCEPHIN)  IV Stopped (02/15/22 1755)   PRN Meds: sodium chloride, docusate sodium, etomidate, ipratropium-albuterol, mouth rinse, mouth rinse, polyethylene glycol   Vital Signs    Vitals:   02/15/22 1927 02/15/22 2300 02/16/22 0322 02/16/22 0836  BP: (!) 147/89 (!) 141/84 138/68 122/68  Pulse: 83 79 79   Resp: '20 19 19   '$ Temp: 98.3 F (36.8 C) 98.3 F (36.8 C) 98.2 F (36.8 C)   TempSrc: Oral Oral Oral Oral  SpO2: 99% 99% 98%   Weight:   97.3 kg   Height:        Intake/Output Summary (Last 24 hours) at 02/16/2022 0853 Last data filed at 02/16/2022 0500 Gross per 24 hour  Intake 630.21 ml  Output 1030 ml  Net -399.79 ml      02/16/2022    3:22 AM 02/13/2022    4:50 AM 02/12/2022    5:22 AM  Last 3 Weights  Weight (lbs) 214 lb 8.1 oz 205 lb 11 oz  195 lb 1.7 oz  Weight (kg) 97.3 kg 93.3 kg 88.5 kg      Telemetry    Afib with rates 70-90s - Personally Reviewed  ECG    No new ECG - Personally Reviewed  Physical Exam   GEN: No acute distress.   Neck: No JVD Cardiac: irregularly irregular, 3/6 systolic ejection murmur at RUSB Respiratory: Scattered wheezing GI: Soft, nontender, non-distended  MS: No edema; S/p L BKA Neuro:  Nonfocal  Psych: Normal affect   Labs    High Sensitivity Troponin:   Recent Labs  Lab 02/12/22 0519 02/12/22 0820  TROPONINIHS 49* 135*     Chemistry Recent Labs  Lab 02/12/22 0519 02/12/22 0527 02/13/22 0510 02/15/22 0311 02/16/22 0559  NA 138   < > 139 136 135  K 5.0   < > 4.3 4.2 3.8  CL 99   < > 102 99 96*  CO2 27  --  '26 27 29  '$ GLUCOSE 245*   < > 170* 141* 120*  BUN 30*   < > 45* 38* 41*  CREATININE 1.94*   < > 2.01* 1.48* 1.34*  CALCIUM 9.0  --  8.8*  9.0 8.8*  MG  --   --   --  1.7  --   PROT 7.6  --   --   --   --   ALBUMIN 3.4*  --   --   --   --   AST 16  --   --   --   --   ALT 11  --   --   --   --   ALKPHOS 72  --   --   --   --   BILITOT 0.3  --   --   --   --   GFRNONAA 37*  --  35* 51* 57*  ANIONGAP 12  --  '11 10 10   '$ < > = values in this interval not displayed.    Lipids  Recent Labs  Lab 02/13/22 0510  TRIG 132    Hematology Recent Labs  Lab 02/13/22 0510 02/15/22 0311 02/16/22 0559  WBC 10.3 10.2 9.3  RBC 2.88* 2.96* 3.19*  HGB 8.8* 9.2* 9.8*  HCT 25.9* 27.1* 28.0*  MCV 89.9 91.6 87.8  MCH 30.6 31.1 30.7  MCHC 34.0 33.9 35.0  RDW 13.1 12.9 12.6  PLT 230 227 244   Thyroid No results for input(s): "TSH", "FREET4" in the last 168 hours.  BNP Recent Labs  Lab 02/12/22 0519 02/14/22 0950  BNP 475.3* 1,439.0*    DDimer No results for input(s): "DDIMER" in the last 168 hours.   Radiology    No results found.  Cardiac Studies     Patient Profile     71 y.o. 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    He was seen by the multidisciplinary valve team during most recent admission and felt to be a marginal candidate for TAVR but not prohibitive. Pre TAVR CTs showed anatomy suitable for a 29 mm Edwards S3UR via the left subclavian approach. Additionally, CTs showed enlarged mediastinal lymph nodes as noted on a previous study which recommended a follow up PET scan in 4-6 weeks. It was felt that he would require amputation of gangrenous right second toe in order to be considered for TAVR and this was successfully completed on 08/26/21. He did follow up with [facility] and this was healing appropriately. Additionally, orthopantogram showed poor dentition and it was recommended he proceed with dental evaluation and likely extraction to mitigate risk of bacterial endocarditis. He has not been able to see a dentist due to prohibitive cost.    The patient had several follow up appointments made with cardiology but no showed to all of the apts.   He is now readmitted for acute respiratory failure requiring intubation. He is now extubated. Being treated with Abx, steroids and nebs. UDS + for cocaine and THC. Admits to drug use with no plans to quit. Per Dr. Gardiner Rhyme, "I think he is a  poor candidate for TAVR given overall health, poor functional status, high infection risk, severe PAD, noncompliance,  and ongoing substance abuse. Plan is to re-discuss with valve team when meet next Tuesday for a final disposition."   Acute on chronic diastolic CHF: Was receiving IV lasix, now transitioned to home lasix '40mg'$  daily. Creat improving.  CAD: Has occluded RCA with 50 to 60% stenosis in LAD and 60% LCx disease.  Managed medically.  Continue Plavix, atorvastatin  Atrial fibrillation: on warfarin per pharmacy.  INR 1.4.  Currently in Afib, rates well controlled.  Start heparin gtt while INR subtherapeutic -target INR 2-3  Acute hypoxic/hypercarbic respiratory failure: Required intubation on admission.Treated for COPD exacerbation, on antibiotics/steroids/DuoNebs.  Improving, now weaned to 1L    For questions or updates, please contact [place] Please consult [email or web address] for contact info under   Pixie Casino, MD, FACC, [place] Director of the [facility] Diplomate of the American Board of Clinical Lipidology Attending Cardiologist  Direct Dial: [phone]  Fax: [phone]  Website:  [email or web address]  Pixie Casino, MD  02/16/2022, 8:53 AM

## 2022-02-16 NOTE — Progress Notes (Signed)
PROGRESS NOTE    Rodney [name]  POE:[id number] DOB: 24-Feb-1951 DOA: 02/12/2022 PCP: [name], MD   Brief Narrative:  71 year old male with hx of COPD(on room air), HTN, HLD, CAD, PAF, DMII, CDKIII, HFrEF, Severe Aortic Stenosis, hx of left BKA, ascending aortic root dilation presenting to the ED with dyspnea on exertion, and cough. He was intubated in the ED and transferred to the ICU for further care. Has had multiple hospitalizations - 6 this year with the the last one in 09/03/21 for COPD exacerbation. He underwent pre-op evaluation for TAVR at that time as well but does not appear to have followed up in the outpatient setting for treatment. UDS positive for cocaine and THC.   Patient initially admitted to the ICU given need for intubation, extubated 10/20 to BiPAP, moderately BiPAP dependent over the past 48 hours but moderately improving at this time.  Patient unwilling to undergo physical therapy or occupational therapy, will not have his prosthetic delivered to participate in any meaningful way.  We discussed ongoing need for therapy both physical and respiratory to ensure safe discharge home which patient was not interested in.  Discussed that once his respiratory status is back to baseline we could consider discharge home.  Cardiology consulted to evaluate patient given previous work-up for TAVR placement that was not completed due to noncompliance.  Unclear if he is an appropriate candidate for procedure given his ongoing cocaine and THC use.  Appreciate cardiology insight and recommendations  Assessment & Plan:   Principal Problem:   Respiratory failure (HCC) Active Problems:   PAF (paroxysmal atrial fibrillation) (HCC)   Acute respiratory failure with hypoxia and hypercarbia (HCC)  Acute hypoxic and hypercarbic respiratory failure, multifactorial Rule out COPD exacerbation Cannot rule out concurrent symptomatic aortic stenosis Acute pulmonary edema/toxic  encephalopathy in the setting of cocaine use -Successfully extubated 02/13/2022 requiring BiPAP initially around-the-clock but able to wean down over the past 24 hours -Severe aortic stenosis likely playing some role in his respiratory distress given orthopnea pulmonary edema is likely at least contributing -Antibiotic course planned x5 days last dose 02/16/2022 -Continue steroids for 5 days -consider taper if respiratory status not improving -Continue nebs -Initially plan for ambulatory oxygen screening but patient is refusing to provide his prosthetic or work with physical therapy or ambulate at this time   Aortic stenosis, severe CAD, history of HFrEF Paroxysmal A-fib, on warfarin; subtherapeutic INR -Previously evaluated for TAVR, noted severe aortic stenosis previously -Mean gradient 29 mmHg noted on echo here -Cardiology sidelined for further evaluation, cardiology team to meet 02/17/2022 to discuss his case -Lovenox to cover while titrating warfarin given subtherapeutic INR -Given patient's noncompliance with follow-up, illicit substance abuse and multiple risks for infection unwilling to comply with medical recommendations he is likely a very poor candidate for TAVR  AKI on CKDIIIa, resolved-Baseline Creatinine around 1.5.   Anemia of chronic disease -Secondary to CKD 3A -No signs or symptoms of bleeding, INR subtherapeutic as above -Continue Lasix, monitor for volume overload    DMII, uncontrolled with hyperglycemia Chronic. On insulin and metformin at home. Continue SSI here.  Resume home long-acting insulin   HLD, PAD, CAD Chronic. On lipitor and plavix at home.   Cocaine and THC use -Counseled at bedside, may increase patient's risk to preclude him from cardiac evaluation   DVT prophylaxis: Lovenox full dose (warfarin on hold) Code Status: Full Family Communication: None present  Status is: Inpatient  Dispo: The patient is from: Home  Anticipated  d/c is to: Home              Anticipated d/c date is: 48 to 72 hours pending clinical course and cardiac work-up              Patient currently not medically stable for discharge  Consultants:  CCM, cardiology  Procedures:  Intubation/extubation as above  Antimicrobials:  Ceftriaxone x5 days  Subjective: No acute issues or events overnight, refused BiPAP but respiratory status appears well controlled on nasal cannula  Objective: Vitals:   02/15/22 1816 02/15/22 1927 02/15/22 2300 02/16/22 0322  BP: (!) 147/76 (!) 147/89 (!) 141/84 138/68  Pulse: 94 83 79 79  Resp: (!) '23 20 19 19  '$ Temp: 98.1 F (36.7 C) 98.3 F (36.8 C) 98.3 F (36.8 C) 98.2 F (36.8 C)  TempSrc: Oral Oral Oral Oral  SpO2: 98% 99% 99% 98%  Weight:    97.3 kg  Height:        Intake/Output Summary (Last 24 hours) at 02/16/2022 0745 Last data filed at 02/16/2022 0500 Gross per 24 hour  Intake 630.21 ml  Output 1030 ml  Net -399.79 ml    Filed Weights   02/12/22 0522 02/13/22 0450 02/16/22 0322  Weight: 88.5 kg 93.3 kg 97.3 kg    Examination:  General:  Pleasantly resting in bed, No acute distress. HEENT:  Normocephalic atraumatic.   Lungs: Without overt wheezes rhonchi or rales Heart:  Regular rate and rhythm.  Holosystolic murmur right sternal border. Abdomen:  Soft, nontender, nondistended.  Without guarding or rebound. Extremities: Left BKA, right multiple toe amputations consistent with surgical history  Data Reviewed: I have personally reviewed following labs and imaging studies  CBC: Recent Labs  Lab 02/12/22 0519 02/12/22 0527 02/12/22 0730 02/13/22 0510 02/15/22 0311 02/16/22 0559  WBC 16.9*  --   --  10.3 10.2 9.3  NEUTROABS 10.6*  --   --  8.5* 8.1*  --   HGB 11.7* 11.6*  11.6* 9.2* 8.8* 9.2* 9.8*  HCT 37.7* 34.0*  34.0* 27.0* 25.9* 27.1* 28.0*  MCV 96.4  --   --  89.9 91.6 87.8  PLT 325  --   --  230 227 659    Basic Metabolic Panel: Recent Labs  Lab 02/12/22 0519  02/12/22 0527 02/12/22 0730 02/13/22 0510 02/15/22 0311 02/16/22 0559  NA 138 138  138 137 139 136 135  K 5.0 5.0  5.1 4.9 4.3 4.2 3.8  CL 99 100  --  102 99 96*  CO2 27  --   --  '26 27 29  '$ GLUCOSE 245* 249*  --  170* 141* 120*  BUN 30* 36*  --  45* 38* 41*  CREATININE 1.94* 1.90*  --  2.01* 1.48* 1.34*  CALCIUM 9.0  --   --  8.8* 9.0 8.8*  MG  --   --   --   --  1.7  --   PHOS  --   --   --   --  3.3  --     GFR: Estimated Creatinine Clearance: 60 mL/min (A) (by C-G formula based on SCr of 1.34 mg/dL (H)). Liver Function Tests: Recent Labs  Lab 02/12/22 0519  AST 16  ALT 11  ALKPHOS 72  BILITOT 0.3  PROT 7.6  ALBUMIN 3.4*    No results for input(s): "LIPASE", "AMYLASE" in the last 168 hours. No results for input(s): "AMMONIA" in the last 168 hours. Coagulation Profile: Recent Labs  Lab 02/12/22 0519 02/14/22 0950 02/15/22 0311 02/16/22 0559  INR 1.3* 1.3* 1.4* 1.6*    Cardiac Enzymes: No results for input(s): "CKTOTAL", "CKMB", "CKMBINDEX", "TROPONINI" in the last 168 hours. BNP (last 3 results) No results for input(s): "PROBNP" in the last 8760 hours. HbA1C: No results for input(s): "HGBA1C" in the last 72 hours. CBG: Recent Labs  Lab 02/15/22 0759 02/15/22 1158 02/15/22 1719 02/15/22 2106 02/16/22 0606  GLUCAP 140* 144* 190* 200* 106*    Lipid Profile: No results for input(s): "CHOL", "HDL", "LDLCALC", "TRIG", "CHOLHDL", "LDLDIRECT" in the last 72 hours.  Thyroid Function Tests: No results for input(s): "TSH", "T4TOTAL", "FREET4", "T3FREE", "THYROIDAB" in the last 72 hours. Anemia Panel: No results for input(s): "VITAMINB12", "FOLATE", "FERRITIN", "TIBC", "IRON", "RETICCTPCT" in the last 72 hours. Sepsis Labs: No results for input(s): "PROCALCITON", "LATICACIDVEN" in the last 168 hours.  Recent Results (from the past 240 hour(s))  Resp Panel by RT-PCR (Flu A&B, Covid) Anterior Nasal Swab     Status: None   Collection Time: 02/12/22  5:19  AM   Specimen: Anterior Nasal Swab  Result Value Ref Range Status   SARS Coronavirus 2 by RT PCR NEGATIVE NEGATIVE Final    Comment: (NOTE) SARS-CoV-2 target nucleic acids are NOT DETECTED.  The SARS-CoV-2 RNA is generally detectable in upper respiratory specimens during the acute phase of infection. The lowest concentration of SARS-CoV-2 viral copies this assay can detect is 138 copies/mL. A negative result does not preclude SARS-Cov-2 infection and should not be used as the sole basis for treatment or other patient management decisions. A negative result may occur with  improper specimen collection/handling, submission of specimen other than nasopharyngeal swab, presence of viral mutation(s) within the areas targeted by this assay, and inadequate number of viral copies(<138 copies/mL). A negative result must be combined with clinical observations, patient history, and epidemiological information. The expected result is Negative.  Fact Sheet for Patients:  EntrepreneurPulse.com.au  Fact Sheet for Healthcare Providers:  IncredibleEmployment.be  This test is no t yet approved or cleared by the [place] FDA and  has been authorized for detection and/or diagnosis of SARS-CoV-2 by FDA under an Emergency Use Authorization (EUA). This EUA will remain  in effect (meaning this test can be used) for the duration of the COVID-19 declaration under Section 564(b)(1) of the Act, 21 U.S.C.section 360bbb-3(b)(1), unless the authorization is terminated  or revoked sooner.       Influenza A by PCR NEGATIVE NEGATIVE Final   Influenza B by PCR NEGATIVE NEGATIVE Final    Comment: (NOTE) The Xpert Xpress SARS-CoV-2/FLU/RSV plus assay is intended as an aid in the diagnosis of influenza from Nasopharyngeal swab specimens and should not be used as a sole basis for treatment. Nasal washings and aspirates are unacceptable for Xpert Xpress  SARS-CoV-2/FLU/RSV testing.  Fact Sheet for Patients: EntrepreneurPulse.com.au  Fact Sheet for Healthcare Providers: IncredibleEmployment.be  This test is not yet approved or cleared by the [place] FDA and has been authorized for detection and/or diagnosis of SARS-CoV-2 by FDA under an Emergency Use Authorization (EUA). This EUA will remain in effect (meaning this test can be used) for the duration of the COVID-19 declaration under Section 564(b)(1) of the Act, 21 U.S.C. section 360bbb-3(b)(1), unless the authorization is terminated or revoked.  Performed at [facility], [place] [address], [place], [place] [address]   Urine Culture     Status: Abnormal   Collection Time: 02/12/22  6:14 AM   Specimen: Urine,  Catheterized  Result Value Ref Range Status   Specimen Description URINE, CATHETERIZED  Final   Special Requests   Final    Normal Performed at [facility], [address] [place], [place] [address]    Culture (A)  Final    30,000 COLONIES/mL ENTEROCOCCUS FAECALIS VANCOMYCIN RESISTANT ENTEROCOCCUS ISOLATED    Report Status 02/14/2022 FINAL  Final   Organism ID, Bacteria ENTEROCOCCUS FAECALIS (A)  Final      Susceptibility   Enterococcus faecalis - MIC*    AMPICILLIN <=2 SENSITIVE Sensitive     NITROFURANTOIN <=16 SENSITIVE Sensitive     VANCOMYCIN >=32 RESISTANT Resistant     LINEZOLID 2 SENSITIVE Sensitive     * 30,000 COLONIES/mL ENTEROCOCCUS FAECALIS  MRSA Next Gen by PCR, Nasal     Status: Abnormal   Collection Time: 02/12/22 10:56 AM   Specimen: Nasal Mucosa; Nasal Swab  Result Value Ref Range Status   MRSA by PCR Next Gen DETECTED (A) NOT DETECTED Final    Comment: RESULT CALLED TO, READ BACK BY AND VERIFIED WITH: RN F FLINT [id number] AT 1353 BY CM (NOTE) The GeneXpert MRSA Assay (FDA approved for NASAL specimens only), is one component of a comprehensive MRSA colonization surveillance program. It is not  intended to diagnose MRSA infection nor to guide or monitor treatment for MRSA infections. Test performance is not FDA approved in patients less than 48 years old. Performed at [facility], [place] [address], [place], [place] [address]          Radiology Studies: No results found.  Scheduled Meds:  atorvastatin  80 mg Oral Daily   Chlorhexidine Gluconate Cloth  6 each Topical Daily   clopidogrel  75 mg Oral Daily   docusate sodium  100 mg Oral BID   enoxaparin (LOVENOX) injection  90 mg Subcutaneous BID   furosemide  40 mg Oral Daily   insulin aspart  0-20 Units Subcutaneous TID WC   insulin aspart  0-5 Units Subcutaneous QHS   insulin detemir  10 Units Subcutaneous Daily   mupirocin ointment  1 Application Nasal BID   mouth rinse  15 mL Mouth Rinse 4 times per day   pantoprazole  40 mg Oral Daily   polyethylene glycol  17 g Oral Daily   predniSONE  40 mg Oral Daily   Warfarin - Pharmacist Dosing Inpatient   Does not apply q1600   Continuous Infusions:  sodium chloride Stopped (02/14/22 2000)   cefTRIAXone (ROCEPHIN)  IV Stopped (02/15/22 1755)     LOS: 4 days   Time spent: 28mn  Beverly Ferner C Andrew Soria, DO [facility]  If 7PM-7AM, please contact night-coverage [email or web address]  02/16/2022, 7:45 AM

## 2022-02-16 NOTE — Plan of Care (Signed)

## 2022-02-16 NOTE — Plan of Care (Signed)
  Problem: Education: Goal: Ability to describe self-care measures that may prevent or decrease complications (Diabetes Survival Skills Education) will improve 02/16/2022 1803 by Lucilla Lame, RN Outcome: Progressing 02/16/2022 1803 by Lucilla Lame, RN Outcome: Progressing Goal: Individualized Educational Video(s) 02/16/2022 1803 by Lucilla Lame, RN Outcome: Progressing 02/16/2022 1803 by Lucilla Lame, RN Outcome: Progressing   Problem: Coping: Goal: Ability to adjust to condition or change in health will improve 02/16/2022 1803 by Lucilla Lame, RN Outcome: Progressing 02/16/2022 1803 by Lucilla Lame, RN Outcome: Progressing   Problem: Fluid Volume: Goal: Ability to maintain a balanced intake and output will improve 02/16/2022 1803 by Lucilla Lame, RN Outcome: Progressing 02/16/2022 1803 by Lucilla Lame, RN Outcome: Progressing   Problem: Health Behavior/Discharge Planning: Goal: Ability to identify and utilize available resources and services will improve 02/16/2022 1803 by Lucilla Lame, RN Outcome: Progressing 02/16/2022 1803 by Lucilla Lame, RN Outcome: Progressing Goal: Ability to manage health-related needs will improve 02/16/2022 1803 by Lucilla Lame, RN Outcome: Progressing 02/16/2022 1803 by Lucilla Lame, RN Outcome: Progressing   Problem: Metabolic: Goal: Ability to maintain appropriate glucose levels will improve Outcome: Progressing   Problem: Nutritional: Goal: Maintenance of adequate nutrition will improve Outcome: Progressing Goal: Progress toward achieving an optimal weight will improve Outcome: Progressing   Problem: Skin Integrity: Goal: Risk for impaired skin integrity will decrease Outcome: Progressing   Problem: Tissue Perfusion: Goal: Adequacy of tissue perfusion will improve Outcome: Progressing   Problem: Education: Goal: Knowledge of General Education information will improve Description: Including  pain rating scale, medication(s)/side effects and non-pharmacologic comfort measures Outcome: Progressing   Problem: Health Behavior/Discharge Planning: Goal: Ability to manage health-related needs will improve Outcome: Progressing   Problem: Clinical Measurements: Goal: Ability to maintain clinical measurements within normal limits will improve Outcome: Progressing Goal: Will remain free from infection Outcome: Progressing Goal: Diagnostic test results will improve Outcome: Progressing Goal: Respiratory complications will improve Outcome: Progressing Goal: Cardiovascular complication will be avoided Outcome: Progressing   Problem: Activity: Goal: Risk for activity intolerance will decrease Outcome: Progressing   Problem: Nutrition: Goal: Adequate nutrition will be maintained Outcome: Progressing   Problem: Coping: Goal: Level of anxiety will decrease Outcome: Progressing   Problem: Elimination: Goal: Will not experience complications related to bowel motility Outcome: Progressing Goal: Will not experience complications related to urinary retention Outcome: Progressing   Problem: Pain Managment: Goal: General experience of comfort will improve Outcome: Progressing   Problem: Safety: Goal: Ability to remain free from injury will improve Outcome: Progressing   Problem: Skin Integrity: Goal: Risk for impaired skin integrity will decrease Outcome: Progressing

## 2022-02-16 NOTE — Care Management Important Message (Signed)
Important Message  Patient Details  Name: Rodney [name] MRN: [id number] Date of Birth: 1950/09/05   Medicare Important Message Given:  Yes     Carlicia Leavens [address] 02/16/2022, 3:46 PM

## 2022-02-16 NOTE — Care Management Important Message (Signed)
Important Message  Patient Details  Name: Rodney [name] MRN: [id number] Date of Birth: 1950-05-23   Medicare Important Message Given:  Yes     Orbie Pyo 02/16/2022, 2:51 PM

## 2022-02-16 NOTE — Progress Notes (Signed)
Pt is currently using nasal cannula maintaining his SpO2 at 99% at this time. Bipap available prn if needed.

## 2022-02-16 NOTE — Progress Notes (Signed)
[place] for Lovenox / Coumadin Indication: atrial fibrillation  No Known Allergies  Patient Measurements: Height: '5\' 10"'$  (177.8 cm) Weight: 97.3 kg (214 lb 8.1 oz) IBW/kg (Calculated) : 73  Vital Signs: Temp: 98.2 F (36.8 C) (10/23 0322) Temp Source: Oral (10/23 0322) BP: 138/68 (10/23 0322) Pulse Rate: 79 (10/23 0322)  Labs: Recent Labs    02/14/22 0950 02/15/22 0311 02/16/22 0559  HGB  --  9.2* 9.8*  HCT  --  27.1* 28.0*  PLT  --  227 244  LABPROT 15.9* 17.3* 19.1*  INR 1.3* 1.4* 1.6*  CREATININE  --  1.48* 1.34*    Estimated Creatinine Clearance: 60 mL/min (A) (by C-G formula based on SCr of 1.34 mg/dL (H)).   Assessment: 88 yoM presented with shortness of breath from COPD exacerbation. Pharmacy consulted to dose warfarin for patient's atrial fibrillation on 10/20. Patient has been off and on anticoagulation, but confirmed with the patient that he is taking his warfarin.  Pharmacy consulted to add Lovenox bridge 10/22 since patient is in Afib with sub-therapeutic INR.  Home warfarin dose:  2.'5mg'$  PO daily  INR 1.6, trending up, remains subtherapeutic SCr 1.34, trending down Hgb 9.8 and Plt 244, trending up  Goal of Therapy:  INR 2-3 Anti-Xa level 0.6-1 units/ml 4hrs after LMWH dose given Monitor platelets by anticoagulation protocol: Yes   Plan:  Continue Lovenox '90mg'$  SQ Q12H Repeat dose of warfarin '5mg'$  PO today Daily PT / INR and CBC Check LMWH level as needed Monitor for s/sx of bleeding daily  Luisa Hart, PharmD, BCPS Clinical Pharmacist 02/16/2022 8:27 AM   Please refer to AMION for pharmacy phone number

## 2022-02-17 ENCOUNTER — Other Ambulatory Visit (HOSPITAL_COMMUNITY): Payer: Self-pay

## 2022-02-17 DIAGNOSIS — I350 Nonrheumatic aortic (valve) stenosis: Secondary | ICD-10-CM | POA: Diagnosis not present

## 2022-02-17 DIAGNOSIS — I480 Paroxysmal atrial fibrillation: Secondary | ICD-10-CM | POA: Diagnosis not present

## 2022-02-17 DIAGNOSIS — J9601 Acute respiratory failure with hypoxia: Secondary | ICD-10-CM | POA: Diagnosis not present

## 2022-02-17 DIAGNOSIS — J9602 Acute respiratory failure with hypercapnia: Secondary | ICD-10-CM | POA: Diagnosis not present

## 2022-02-17 LAB — PROTIME-INR
INR: 1.6 — ABNORMAL HIGH (ref 0.8–1.2)
Prothrombin Time: 18.9 seconds — ABNORMAL HIGH (ref 11.4–15.2)

## 2022-02-17 LAB — GLUCOSE, CAPILLARY
Glucose-Capillary: 128 mg/dL — ABNORMAL HIGH (ref 70–99)
Glucose-Capillary: 136 mg/dL — ABNORMAL HIGH (ref 70–99)

## 2022-02-17 LAB — CBC
HCT: 31.1 % — ABNORMAL LOW (ref 39.0–52.0)
Hemoglobin: 10.6 g/dL — ABNORMAL LOW (ref 13.0–17.0)
MCH: 30.5 pg (ref 26.0–34.0)
MCHC: 34.1 g/dL (ref 30.0–36.0)
MCV: 89.4 fL (ref 80.0–100.0)
Platelets: 244 10*3/uL (ref 150–400)
RBC: 3.48 MIL/uL — ABNORMAL LOW (ref 4.22–5.81)
RDW: 12.6 % (ref 11.5–15.5)
WBC: 8.4 10*3/uL (ref 4.0–10.5)
nRBC: 0 % (ref 0.0–0.2)

## 2022-02-17 MED ORDER — ENOXAPARIN SODIUM 100 MG/ML IJ SOSY: 90.0000 mg | PREFILLED_SYRINGE | Freq: Two times a day (BID) | INTRAMUSCULAR | 0 refills | Status: DC

## 2022-02-17 MED ORDER — WARFARIN SODIUM 7.5 MG PO TABS: 7.5000 mg | ORAL_TABLET | Freq: Once | ORAL | Status: DC

## 2022-02-17 MED ORDER — ENOXAPARIN SODIUM 100 MG/ML IJ SOSY
90.0000 mg | PREFILLED_SYRINGE | Freq: Two times a day (BID) | INTRAMUSCULAR | 0 refills | Status: DC
  Filled 2022-02-17: qty 8, 4d supply, fill #0

## 2022-02-17 MED ORDER — WARFARIN SODIUM 7.5 MG PO TABS: 7.5000 mg | ORAL_TABLET | Freq: Once | ORAL | 0 refills | Status: DC

## 2022-02-17 NOTE — Discharge Summary (Signed)
Physician Discharge Summary  JOAQUIN KNEBEL XTK:[id number] DOB: May 02, 1950 DOA: 02/12/2022  PCP: [name], MD  Admit date: 02/12/2022 Discharge date: 02/17/2022  Admitted From: Home Disposition: Home  Recommendations for Outpatient Follow-up:  Follow up with PCP in 1-2 weeks Follow-up with Coumadin clinic and cardiology as scheduled  Home Health: None Equipment/Devices: None  Discharge Condition: Stable CODE STATUS: Full Diet recommendation: Low-salt low-fat diet  Brief/Interim Summary: 71 year old male with hx of COPD(on room air), HTN, HLD, CAD, PAF, DMII, CDKIII, HFrEF, Severe Aortic Stenosis, hx of left BKA, ascending aortic root dilation presenting to the ED with dyspnea on exertion, and cough. He was intubated in the ED and transferred to the ICU for further care. Has had multiple hospitalizations - 6 this year with the the last one in 09/03/21 for COPD exacerbation. He underwent pre-op evaluation for TAVR at that time as well but does not appear to have followed up in the outpatient setting for treatment. UDS positive for cocaine and THC.    Patient initially admitted to the ICU given need for intubation, extubated 10/20 to BiPAP, moderately BiPAP dependent over the past 48 hours but moderately improving at this time.  Patient unwilling to undergo physical therapy or occupational therapy, will not have his prosthetic delivered to participate in any meaningful way.  We discussed ongoing need for therapy both physical and respiratory to ensure safe discharge home which patient was not interested in.  Discussed that once his respiratory status is back to baseline we could consider discharge home.  Cardiology consulted to evaluate patient given previous work-up for TAVR placement that was not completed due to noncompliance.  For the patient remains high risk candidate for procedure given above infection risk with dentition as well as ongoing illicit substance abuse.  Patient  otherwise stable and agreeable for discharge home, appears euvolemic at this time without further symptoms, discharge home as above, close follow-up with PCP cardiology and Coumadin clinic as scheduled.  Patient be discharged on Lovenox for additional 4 days prior to Coumadin clinic follow-up on Friday to ensure appropriate titration of Coumadin.  He is well versed in Lovenox injections as he has done this multiple times previously due to labile INR.   Discharge Diagnoses:  Principal Problem:   Respiratory failure ([place]) Active Problems:   Severe aortic stenosis   PAF (paroxysmal atrial fibrillation) (HCC)   Acute respiratory failure with hypoxia and hypercarbia (HCC)    Discharge Instructions   Allergies as of 02/17/2022   No Known Allergies      Medication List     STOP taking these medications    carvedilol 12.5 MG tablet Commonly known as: COREG   losartan 25 MG tablet Commonly known as: COZAAR       TAKE these medications    acetaminophen 500 MG tablet Commonly known as: TYLENOL Take 1,000 mg by mouth every 6 (six) hours as needed for mild pain.   atorvastatin 80 MG tablet Commonly known as: LIPITOR Take 1 tablet (80 mg total) by mouth daily.   clopidogrel 75 MG tablet Commonly known as: PLAVIX TAKE 1 TABLET EVERY DAY WITH BREAKFAST What changed: See the new instructions.   enoxaparin 100 MG/ML injection Commonly known as: LOVENOX Inject 0.9 mLs (90 mg total) into the skin 2 (two) times daily. *Discard 0.79m of medication before administering the injection*   fluticasone 50 MCG/ACT nasal spray Commonly known as: FLONASE Place 2 sprays into both nostrils daily. What changed:  when to  take this reasons to take this   furosemide 40 MG tablet Commonly known as: LASIX TAKE 1 TABLET EVERY DAY   gabapentin 100 MG capsule Commonly known as: NEURONTIN Take 100 mg by mouth 2 (two) times daily.   insulin detemir 100 UNIT/ML FlexPen Commonly known as:  LEVEMIR Inject 15 Units into the skin daily.   ipratropium-albuterol 0.5-2.5 (3) MG/3ML Soln Commonly known as: DUONEB Take 3 mLs by nebulization every 4 (four) hours as needed.   metFORMIN 1000 MG tablet Commonly known as: GLUCOPHAGE TAKE 1 TABLET EVERY DAY   NUTRI-DRINK PO Take 1 Bottle by mouth as needed (nutrition). Lactose free nutrition   pantoprazole 40 MG tablet Commonly known as: PROTONIX Take 1 tablet by mouth twice daily   ReliOn True Metrix Test Strips test strip Generic drug: glucose blood Use to test blood sugar three times per day.   warfarin 7.5 MG tablet Commonly known as: COUMADIN Take as directed. If you are unsure how to take this medication, talk to your nurse or doctor. Original instructions: Take 1 tablet (7.5 mg total) by mouth one time only at 4 PM. What changed:  medication strength how much to take when to take this        No Known Allergies  Consultations: Cardiology  Procedures/Studies: ECHOCARDIOGRAM COMPLETE  Result Date: 02/12/2022    ECHOCARDIOGRAM REPORT   Patient Name:   Rodney [name] Date of Exam: 02/12/2022 Medical Rec #:  [id number]         Height:       70.0 in Accession #:    [id number]        Weight:       195.1 lb Date of Birth:  Dec 13, 1950        BSA:          2.065 m Patient Age:    71 years          BP:           129/66 mmHg Patient Gender: M                 HR:           74 bpm. Exam Location:  Inpatient Procedure: 2D Echo, Cardiac Doppler and Color Doppler Indications:    Dyspnea  History:        Patient has prior history of Echocardiogram examinations, most                 recent 06/23/2021. CAD, COPD, Aortic Valve Disease; Risk                 Factors:Hypertension, Dyslipidemia and Diabetes.  Sonographer:    Clayton Lefort RDCS (AE) Referring Phys: [id number] Francesca Jewett  Sonographer Comments: Echo performed with patient supine and on artificial respirator. IMPRESSIONS  1. Heavily calcified aortic valve with severe AS (mean  gradient 29 mmHg; AVA 0.7 cm2; DI 0.18); mean gradient 06/23/21 documented at 43 mmHg.  2. Left ventricular ejection fraction, by estimation, is 60 to 65%. The left ventricle has normal function. The left ventricle has no regional wall motion abnormalities. The left ventricular internal cavity size was mildly dilated. There is moderate concentric left ventricular hypertrophy. Left ventricular diastolic function could not be evaluated. Elevated left atrial pressure.  3. Right ventricular systolic function is normal. The right ventricular size is normal.  4. Left atrial size was moderately dilated.  5. Right atrial size was mildly dilated.  6. The mitral valve is normal in  structure. Mild mitral valve regurgitation. No evidence of mitral stenosis. Severe mitral annular calcification.  7. The aortic valve is calcified. Aortic valve regurgitation is mild. Severe aortic valve stenosis.  8. Aortic dilatation noted. There is borderline dilatation of the ascending aorta, measuring 39 mm.  9. The inferior vena cava is normal in size with greater than 50% respiratory variability, suggesting right atrial pressure of 3 mmHg. FINDINGS  Left Ventricle: Left ventricular ejection fraction, by estimation, is 60 to 65%. The left ventricle has normal function. The left ventricle has no regional wall motion abnormalities. The left ventricular internal cavity size was mildly dilated. There is  moderate concentric left ventricular hypertrophy. Left ventricular diastolic function could not be evaluated due to mitral annular calcification (moderate or greater). Left ventricular diastolic function could not be evaluated. Elevated left atrial pressure. Right Ventricle: The right ventricular size is normal. Right ventricular systolic function is normal. Left Atrium: Left atrial size was moderately dilated. Right Atrium: Right atrial size was mildly dilated. Pericardium: Trivial pericardial effusion is present. Mitral Valve: The mitral valve is  normal in structure. Severe mitral annular calcification. Mild mitral valve regurgitation. No evidence of mitral valve stenosis. MV peak gradient, 5.5 mmHg. The mean mitral valve gradient is 3.0 mmHg. Tricuspid Valve: The tricuspid valve is normal in structure. Tricuspid valve regurgitation is mild . No evidence of tricuspid stenosis. Aortic Valve: The aortic valve is calcified. Aortic valve regurgitation is mild. Severe aortic stenosis is present. Aortic valve mean gradient measures 29.0 mmHg. Aortic valve peak gradient measures 44.6 mmHg. Aortic valve area, by VTI measures 0.67 cm. Pulmonic Valve: The pulmonic valve was normal in structure. Pulmonic valve regurgitation is not visualized. No evidence of pulmonic stenosis. Aorta: Aortic dilatation noted. There is borderline dilatation of the ascending aorta, measuring 39 mm. Venous: IVC assessment for right atrial pressure unable to be performed due to mechanical ventilation. The inferior vena cava is normal in size with greater than 50% respiratory variability, suggesting right atrial pressure of 3 mmHg. IAS/Shunts: No atrial level shunt detected by color flow Doppler. Additional Comments: Heavily calcified aortic valve with severe AS (mean gradient 29 mmHg; AVA 0.7 cm2; DI 0.18); mean gradient 06/23/21 documented at 43 mmHg.  LEFT VENTRICLE PLAX 2D LVIDd:         5.60 cm      Diastology LVIDs:         4.30 cm      LV e' medial:    5.44 cm/s LV PW:         1.30 cm      LV E/e' medial:  19.1 LV IVS:        1.40 cm      LV e' lateral:   7.29 cm/s LVOT diam:     2.20 cm      LV E/e' lateral: 14.3 LV SV:         57 LV SV Index:   28 LVOT Area:     3.80 cm  LV Volumes (MOD) LV vol d, MOD A2C: 97.1 ml LV vol d, MOD A4C: 152.0 ml LV vol s, MOD A2C: 42.4 ml LV vol s, MOD A4C: 78.9 ml LV SV MOD A2C:     54.7 ml LV SV MOD A4C:     152.0 ml LV SV MOD BP:      59.4 ml RIGHT VENTRICLE RV Basal diam:  3.70 cm RV Mid diam:    2.80 cm RV S prime:  12.60 cm/s TAPSE (M-mode):  2.5 cm LEFT ATRIUM           Index        RIGHT ATRIUM           Index LA diam:      5.00 cm 2.42 cm/m   RA Area:     28.60 cm LA Vol (A4C): 87.7 ml 42.46 ml/m  RA Volume:   91.10 ml  44.11 ml/m  AORTIC VALVE AV Area (Vmax):    0.72 cm AV Area (Vmean):   0.64 cm AV Area (VTI):     0.67 cm AV Vmax:           334.00 cm/s AV Vmean:          255.000 cm/s AV VTI:            0.850 m AV Peak Grad:      44.6 mmHg AV Mean Grad:      29.0 mmHg LVOT Vmax:         63.10 cm/s LVOT Vmean:        42.700 cm/s LVOT VTI:          0.150 m LVOT/AV VTI ratio: 0.18  AORTA Ao Root diam: 3.70 cm Ao Asc diam:  3.90 cm MITRAL VALVE MV Area (PHT): 2.95 cm       SHUNTS MV Area VTI:   1.50 cm       Systemic VTI:  0.15 m MV Peak grad:  5.5 mmHg       Systemic Diam: 2.20 cm MV Mean grad:  3.0 mmHg MV Vmax:       1.17 m/s MV Vmean:      81.4 cm/s MV Decel Time: 257 msec MR Peak grad:    113.2 mmHg MR Mean grad:    73.0 mmHg MR Vmax:         532.00 cm/s MR Vmean:        405.0 cm/s MR PISA:         1.01 cm MR PISA Eff ROA: 8 mm MR PISA Radius:  0.40 cm MV E velocity: 104.00 cm/s MV A velocity: 101.00 cm/s MV E/A ratio:  1.03 Kirk Ruths MD Electronically signed by Kirk Ruths MD Signature Date/Time: 02/12/2022/3:59:38 PM    Final    CT Head Wo Contrast  Result Date: 02/12/2022 CLINICAL DATA:  Blunt poly trauma, unresponsive. EXAM: CT HEAD WITHOUT CONTRAST TECHNIQUE: Contiguous axial images were obtained from the base of the skull through the vertex without intravenous contrast. RADIATION DOSE REDUCTION: This exam was performed according to the departmental dose-optimization program which includes automated exposure control, adjustment of the mA and/or kV according to patient size and/or use of iterative reconstruction technique. COMPARISON:  None Available. 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 There is  mild-to-moderate cerebral atrophy, mild atrophic ventriculomegaly and moderate to severe small vessel disease the cerebral white matter. Relatively mild cerebellar atrophy. No asymmetry is seen concerning for acute infarct, hemorrhage or mass. There is no midline shift. Basal cisterns are clear. Vascular: Both carotid siphons of the distal left vertebral artery are heavily calcified. No hyperdense central vessel is seen. Skull: No fracture or focal lesion is evident. There is no visible scalp hematoma. Sinuses/Orbits: There is mild membrane thickening in ethmoid sinus. There is a small retention cyst or polyp inferiorly in the left maxillary sinus and mild membrane disease in  the medial right maxillary sinus. The frontal and sphenoid sinus are clear. There is bilateral proptosis and proliferated intraorbital fat but there is no extraocular muscle thickening. Other: There is trace fluid in right mastoid tip. There is mild patchy fluid in the lower left mastoid air cells. Both middle ear cavities remaining mastoids are clear. There is an S shaped nasal septum. The scout image demonstrates orotracheal and oroenteric intubation. IMPRESSION: 1. No acute intracranial CT findings or depressed skull fractures. 2. Small left mastoid effusion, trace right mastoid fluid. 3. Sinus membrane disease. 4. Chronic right occipital PCA infarct. 5. Atrophy with moderate to severe small vessel disease 6. Proptosis and bilateral proliferated intraorbital fat, which may be seen in the setting of obesity, steroid use, and thyroid ophthalmopathy. There is no thickening of the extraocular muscles. Electronically Signed   By: Telford Nab M.D.   On: 02/12/2022 06:14   DG Chest Portable 1 View  Result Date: 02/12/2022 CLINICAL DATA:  Verify intubation. EXAM: PORTABLE CHEST 1 VIEW COMPARISON:  Portable chest 08/19/2021. FINDINGS: 5:20 a.m. NGT is in place, the tip abutting the far wall of the body of the stomach. ETT is inserted with the  tip 6.3 cm from the carina in between the heads of the clavicles. There is cardiomegaly with mild perihilar vascular congestion. Mild interstitial edema seen in the lower zones. There are small layering pleural effusions. Patchy hazy opacities of the right lung perihilar areas and left lower lung field are noted and could be due to ground-glass edema, pneumonitis or combination. The left upper lung field is clear. There is a stable mediastinal configuration with calcification in the aorta. Osteopenia and mild thoracic dextroscoliosis. IMPRESSION: 1. Support tubes as above. 2. Mild perihilar vascular congestion and lower zonal interstitial edema with small pleural effusions. 3. Bilateral lung opacities consistent with edema, pneumonia or combination, right-greater-than-left. 4. Clinical correlation and radiographic follow-up recommended. Electronically Signed   By: Telford Nab M.D.   On: 02/12/2022 06:05     Subjective: No acute issues or events overnight denies nausea vomiting diarrhea constipation any fevers chills or chest pain   Discharge Exam: Vitals:   02/17/22 0400 02/17/22 1115  BP: 139/86 121/75  Pulse: 88 77  Resp: 20 18  Temp: 98.2 F (36.8 C) 98.3 F (36.8 C)  SpO2: 100% 99%   Vitals:   02/16/22 1935 02/16/22 2247 02/17/22 0400 02/17/22 1115  BP: 135/89 139/78 139/86 121/75  Pulse: 79 76 88 77  Resp: '20 19 20 18  '$ Temp: 98.1 F (36.7 C) 98 F (36.7 C) 98.2 F (36.8 C) 98.3 F (36.8 C)  TempSrc: Oral Oral Oral Oral  SpO2: 100% 100% 100% 99%  Weight:   89.4 kg   Height:        General:  Pleasantly resting in bed, No acute distress. HEENT:  Normocephalic atraumatic.   Lungs: Without overt wheezes rhonchi or rales Heart:  Regular rate and rhythm.  Holosystolic murmur right sternal border. Abdomen:  Soft, nontender, nondistended.  Without guarding or rebound. Extremities: Left BKA, right multiple toe amputations consistent with surgical history   The results of  significant diagnostics from this hospitalization (including imaging, microbiology, ancillary and laboratory) are listed below for reference.     Microbiology: Recent Results (from the past 240 hour(s))  Resp Panel by RT-PCR (Flu A&B, Covid) Anterior Nasal Swab     Status: None   Collection Time: 02/12/22  5:19 AM   Specimen: Anterior Nasal Swab  Result Value Ref  Range Status   SARS Coronavirus 2 by RT PCR NEGATIVE NEGATIVE Final    Comment: (NOTE) SARS-CoV-2 target nucleic acids are NOT DETECTED.  The SARS-CoV-2 RNA is generally detectable in upper respiratory specimens during the acute phase of infection. The lowest concentration of SARS-CoV-2 viral copies this assay can detect is 138 copies/mL. A negative result does not preclude SARS-Cov-2 infection and should not be used as the sole basis for treatment or other patient management decisions. A negative result may occur with  improper specimen collection/handling, submission of specimen other than nasopharyngeal swab, presence of viral mutation(s) within the areas targeted by this assay, and inadequate number of viral copies(<138 copies/mL). A negative result must be combined with clinical observations, patient history, and epidemiological information. The expected result is Negative.  Fact Sheet for Patients:  EntrepreneurPulse.com.au  Fact Sheet for Healthcare Providers:  IncredibleEmployment.be  This test is no t yet approved or cleared by the [place] FDA and  has been authorized for detection and/or diagnosis of SARS-CoV-2 by FDA under an Emergency Use Authorization (EUA). This EUA will remain  in effect (meaning this test can be used) for the duration of the COVID-19 declaration under Section 564(b)(1) of the Act, 21 U.S.C.section 360bbb-3(b)(1), unless the authorization is terminated  or revoked sooner.       Influenza A by PCR NEGATIVE NEGATIVE Final   Influenza B by PCR  NEGATIVE NEGATIVE Final    Comment: (NOTE) The Xpert Xpress SARS-CoV-2/FLU/RSV plus assay is intended as an aid in the diagnosis of influenza from Nasopharyngeal swab specimens and should not be used as a sole basis for treatment. Nasal washings and aspirates are unacceptable for Xpert Xpress SARS-CoV-2/FLU/RSV testing.  Fact Sheet for Patients: EntrepreneurPulse.com.au  Fact Sheet for Healthcare Providers: IncredibleEmployment.be  This test is not yet approved or cleared by the [place] FDA and has been authorized for detection and/or diagnosis of SARS-CoV-2 by FDA under an Emergency Use Authorization (EUA). This EUA will remain in effect (meaning this test can be used) for the duration of the COVID-19 declaration under Section 564(b)(1) of the Act, 21 U.S.C. section 360bbb-3(b)(1), unless the authorization is terminated or revoked.  Performed at [facility], [place] [address] [place], [place] [address]   Urine Culture     Status: Abnormal   Collection Time: 02/12/22  6:14 AM   Specimen: Urine, Catheterized  Result Value Ref Range Status   Specimen Description URINE, CATHETERIZED  Final   Special Requests   Final    Normal Performed at [facility], [place] [address], [place], [place] [address]    Culture (A)  Final    30,000 COLONIES/mL ENTEROCOCCUS FAECALIS VANCOMYCIN RESISTANT ENTEROCOCCUS ISOLATED    Report Status 02/14/2022 FINAL  Final   Organism ID, Bacteria ENTEROCOCCUS FAECALIS (A)  Final      Susceptibility   Enterococcus faecalis - MIC*    AMPICILLIN <=2 SENSITIVE Sensitive     NITROFURANTOIN <=16 SENSITIVE Sensitive     VANCOMYCIN >=32 RESISTANT Resistant     LINEZOLID 2 SENSITIVE Sensitive     * 30,000 COLONIES/mL ENTEROCOCCUS FAECALIS  MRSA Next Gen by PCR, Nasal     Status: Abnormal   Collection Time: 02/12/22 10:56 AM   Specimen: Nasal Mucosa; Nasal Swab  Result Value Ref Range Status   MRSA by PCR  Next Gen DETECTED (A) NOT DETECTED Final    Comment: RESULT CALLED TO, READ BACK BY AND VERIFIED WITH: RN Jinger Neighbors [phone] AT  1353 BY CM (NOTE) The GeneXpert MRSA Assay (FDA approved for NASAL specimens only), is one component of a comprehensive MRSA colonization surveillance program. It is not intended to diagnose MRSA infection nor to guide or monitor treatment for MRSA infections. Test performance is not FDA approved in patients less than 83 years old. Performed at [facility], [place] [address] [place], [place] [address]      Labs: BNP (last 3 results) Recent Labs    08/19/21 0545 02/12/22 0519 02/14/22 0950  BNP 692.1* 475.3* 0,175.1*   Basic Metabolic Panel: Recent Labs  Lab 02/12/22 0519 02/12/22 0527 02/12/22 0730 02/13/22 0510 02/15/22 0311 02/16/22 0559  NA 138 138  138 137 139 136 135  K 5.0 5.0  5.1 4.9 4.3 4.2 3.8  CL 99 100  --  102 99 96*  CO2 27  --   --  '26 27 29  '$ GLUCOSE 245* 249*  --  170* 141* 120*  BUN 30* 36*  --  45* 38* 41*  CREATININE 1.94* 1.90*  --  2.01* 1.48* 1.34*  CALCIUM 9.0  --   --  8.8* 9.0 8.8*  MG  --   --   --   --  1.7  --   PHOS  --   --   --   --  3.3  --    Liver Function Tests: Recent Labs  Lab 02/12/22 0519  AST 16  ALT 11  ALKPHOS 72  BILITOT 0.3  PROT 7.6  ALBUMIN 3.4*   No results for input(s): "LIPASE", "AMYLASE" in the last 168 hours. No results for input(s): "AMMONIA" in the last 168 hours. CBC: Recent Labs  Lab 02/12/22 0519 02/12/22 0527 02/12/22 0730 02/13/22 0510 02/15/22 0311 02/16/22 0559 02/17/22 0056  WBC 16.9*  --   --  10.3 10.2 9.3 8.4  NEUTROABS 10.6*  --   --  8.5* 8.1*  --   --   HGB 11.7*   < > 9.2* 8.8* 9.2* 9.8* 10.6*  HCT 37.7*   < > 27.0* 25.9* 27.1* 28.0* 31.1*  MCV 96.4  --   --  89.9 91.6 87.8 89.4  PLT 325  --   --  230 227 244 244   < > = values in this interval not displayed.   Cardiac Enzymes: No results for input(s): "CKTOTAL", "CKMB", "CKMBINDEX", "TROPONINI"  in the last 168 hours. BNP: Invalid input(s): "POCBNP" CBG: Recent Labs  Lab 02/16/22 1134 02/16/22 1642 02/16/22 2118 02/17/22 0628 02/17/22 1112  GLUCAP 127* 158* 180* 128* 136*   D-Dimer No results for input(s): "DDIMER" in the last 72 hours. Hgb A1c No results for input(s): "HGBA1C" in the last 72 hours. Lipid Profile No results for input(s): "CHOL", "HDL", "LDLCALC", "TRIG", "CHOLHDL", "LDLDIRECT" in the last 72 hours. Thyroid function studies No results for input(s): "TSH", "T4TOTAL", "T3FREE", "THYROIDAB" in the last 72 hours.  Invalid input(s): "FREET3" Anemia work up No results for input(s): "VITAMINB12", "FOLATE", "FERRITIN", "TIBC", "IRON", "RETICCTPCT" in the last 72 hours. Urinalysis    Component Value Date/Time   COLORURINE YELLOW 02/12/2022 1056   APPEARANCEUR CLOUDY (A) 02/12/2022 1056   LABSPEC 1.010 02/12/2022 1056   PHURINE 5.0 02/12/2022 1056   GLUCOSEU NEGATIVE 02/12/2022 1056   HGBUR SMALL (A) 02/12/2022 1056   BILIRUBINUR NEGATIVE 02/12/2022 [place] 02/12/2022 1056   PROTEINUR 30 (A) 02/12/2022 1056   UROBILINOGEN 1.0 01/20/2009 1450   NITRITE NEGATIVE 02/12/2022 1056   LEUKOCYTESUR LARGE (  A) 02/12/2022 1056   Sepsis Labs Recent Labs  Lab 02/13/22 0510 02/15/22 0311 02/16/22 0559 02/17/22 0056  WBC 10.3 10.2 9.3 8.4   Microbiology Recent Results (from the past 240 hour(s))  Resp Panel by RT-PCR (Flu A&B, Covid) Anterior Nasal Swab     Status: None   Collection Time: 02/12/22  5:19 AM   Specimen: Anterior Nasal Swab  Result Value Ref Range Status   SARS Coronavirus 2 by RT PCR NEGATIVE NEGATIVE Final    Comment: (NOTE) SARS-CoV-2 target nucleic acids are NOT DETECTED.  The SARS-CoV-2 RNA is generally detectable in upper respiratory specimens during the acute phase of infection. The lowest concentration of SARS-CoV-2 viral copies this assay can detect is 138 copies/mL. A negative result does not preclude  SARS-Cov-2 infection and should not be used as the sole basis for treatment or other patient management decisions. A negative result may occur with  improper specimen collection/handling, submission of specimen other than nasopharyngeal swab, presence of viral mutation(s) within the areas targeted by this assay, and inadequate number of viral copies(<138 copies/mL). A negative result must be combined with clinical observations, patient history, and epidemiological information. The expected result is Negative.  Fact Sheet for Patients:  EntrepreneurPulse.com.au  Fact Sheet for Healthcare Providers:  IncredibleEmployment.be  This test is no t yet approved or cleared by the [place] FDA and  has been authorized for detection and/or diagnosis of SARS-CoV-2 by FDA under an Emergency Use Authorization (EUA). This EUA will remain  in effect (meaning this test can be used) for the duration of the COVID-19 declaration under Section 564(b)(1) of the Act, 21 U.S.C.section 360bbb-3(b)(1), unless the authorization is terminated  or revoked sooner.       Influenza A by PCR NEGATIVE NEGATIVE Final   Influenza B by PCR NEGATIVE NEGATIVE Final    Comment: (NOTE) The Xpert Xpress SARS-CoV-2/FLU/RSV plus assay is intended as an aid in the diagnosis of influenza from Nasopharyngeal swab specimens and should not be used as a sole basis for treatment. Nasal washings and aspirates are unacceptable for Xpert Xpress SARS-CoV-2/FLU/RSV testing.  Fact Sheet for Patients: EntrepreneurPulse.com.au  Fact Sheet for Healthcare Providers: IncredibleEmployment.be  This test is not yet approved or cleared by the [place] FDA and has been authorized for detection and/or diagnosis of SARS-CoV-2 by FDA under an Emergency Use Authorization (EUA). This EUA will remain in effect (meaning this test can be used) for the duration of  the COVID-19 declaration under Section 564(b)(1) of the Act, 21 U.S.C. section 360bbb-3(b)(1), unless the authorization is terminated or revoked.  Performed at [facility], [place] [address], [place], [place] [address]   Urine Culture     Status: Abnormal   Collection Time: 02/12/22  6:14 AM   Specimen: Urine, Catheterized  Result Value Ref Range Status   Specimen Description URINE, CATHETERIZED  Final   Special Requests   Final    Normal Performed at [facility], [place] [address], [place], [place] [address]    Culture (A)  Final    30,000 COLONIES/mL ENTEROCOCCUS FAECALIS VANCOMYCIN RESISTANT ENTEROCOCCUS ISOLATED    Report Status 02/14/2022 FINAL  Final   Organism ID, Bacteria ENTEROCOCCUS FAECALIS (A)  Final      Susceptibility   Enterococcus faecalis - MIC*    AMPICILLIN <=2 SENSITIVE Sensitive     NITROFURANTOIN <=16 SENSITIVE Sensitive     VANCOMYCIN >=32 RESISTANT Resistant     LINEZOLID 2 SENSITIVE Sensitive     *  30,000 COLONIES/mL ENTEROCOCCUS FAECALIS  MRSA Next Gen by PCR, Nasal     Status: Abnormal   Collection Time: 02/12/22 10:56 AM   Specimen: Nasal Mucosa; Nasal Swab  Result Value Ref Range Status   MRSA by PCR Next Gen DETECTED (A) NOT DETECTED Final    Comment: RESULT CALLED TO, READ BACK BY AND VERIFIED WITH: RN F FLINT [id number] AT 1353 BY CM (NOTE) The GeneXpert MRSA Assay (FDA approved for NASAL specimens only), is one component of a comprehensive MRSA colonization surveillance program. It is not intended to diagnose MRSA infection nor to guide or monitor treatment for MRSA infections. Test performance is not FDA approved in patients less than 73 years old. Performed at [facility], [place] [address] [place], [place] [address]      Time coordinating discharge: Over 30 minutes  SIGNED:   Little Ishikawa, DO [facility] 02/17/2022, 4:14 PM Pager   If 7PM-7AM, please contact night-coverage [email or web address]

## 2022-02-17 NOTE — Consult Note (Addendum)
[facility]   MULTIDISCIPLINARY HEART VALVE TEAM  Cardiology Consultation:   Patient ID: Rodney [name] MRN: [id number]; DOB: 02-14-1951  Admit date: 02/12/2022 Date of Consult: 02/17/2022  Primary Care Provider: [name], MD [facility] Cardiologist: Evalina Field, MD  [place] Electrophysiologist:  None    Patient Profile:   Rodney [name] is a 71 y.o. 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 Currently admitted for acute respiratory failure requiring intubation and cardiology consulted for severe AS and candidacy for TAVR.   History of Present Illness:   Has had multiple hospitalizations (at least six this year) with the the last one in 09/03/21 for COPD exacerbation.    He was admitted 4/25-08/31/21 for acute on chronic respiratory failure with hypoxia and hypercapnia 2/2 AECOPD and acute CHF. Echo during this admission showed EF 55%, severe AS with a mean grad 43 mmHg, peak grad 74 mmHg, AVA 0.82 cm2, DVI 0.22, SVI 30, and small pericardial effusion. [facility] 08/25/21 showed multivessel CAD with coronary calcification.  The LAD has mild 20% proximal narrowing and then has diffuse 40 to 50% mid stenoses after the first diagonal vessel; the left circumflex vessel gives rise to a high marginal branch which bifurcates and extends to the apex and has diffuse 60% proximal to mid stenosis, the AV groove circumflex after OM1 is segmentally narrowed at 60%; the RCA has 70% proximal stenosis and then has  previously noted chronic total occlusion.  There is faint collateralization to a portion of the distal RCA from the conus branch and faint collateralization from the LAD to the PDA and PLA system. There was moderate elevation of right heart pressures with moderate pulmonary artery stenosis with mean PA pressure 35 mm as well as a calcified aortic valve with mean gradient of 34 mmHg, peak to peak 36 mm Hg with at least moderate stenosis and a severely calcified mitral annulus.    He was seen by the multidisciplinary valve team during this admission and felt to be a marginal candidate for TAVR but not prohibitive. Pre TAVR CTs showed anatomy suitable for a 29 mm Edwards S3UR via the left subclavian approach. Additionally, CTs showed enlarged mediastinal lymph nodes as noted on a previous study which recommended a follow up PET scan in 4-6 weeks. It was felt that he would require amputation of gangrenous right second toe in order to be considered for TAVR and this was successfully completed on 08/26/21. He did follow up with [facility] and this was healing appropriately. Additionally, orthopantogram showed poor dentition and it was recommended he proceed with dental evaluation and likely extraction to mitigate risk of bacterial endocarditis.    The patient had several follow up appointments made with cardiology but no showed to all of the apts. He never saw a dentist.    He was then readmitted 10/19 for acute respiratory failure requiring intubation. He is now extubated. Being treated with Abx, steroids and nebs. UDS + for cocaine and THC. Repeat echo this admission showed EF 60-65%, moderate LVH with severe AS with  a mean gradient of 29 mm hg, AVA 0.7, DI 0.18, mild AI as well as severe MAC with mild MR.    Cardiology is consulted for consideration of TAVR for aortic stenosis.  The patient is well-known to the structural heart team and has been followed for some time now.  He has been noncompliant and does not show up  to any of his follow-up appointments.  The patient reports being unaware of any follow-up appointments.  He will not see a dentist as he cannot afford any dental work or extractions.  He reports persistent shortness of breath that is improved from admission.  He denies lower extremity edema orthopnea.  He admits to using cocaine and has no plans to quit stating " I enjoy it and will continue to use it if offered to me".  He reports having used cocaine right before this admission. He is wheelchair bound and does not walk.    Past Medical History:  Diagnosis Date   Acute combined systolic and diastolic congestive heart failure (HCC)    Acute on chronic heart failure with preserved ejection fraction (HFpEF) (HCC)    Acute respiratory failure with hypoxia (HCC)    Acute respiratory failure with hypoxia (HCC)    Aortic stenosis    moderate in 2022   Atrial fibrillation (HCC)    CHF (congestive heart failure) (HCC)    Chronic pleural effusion, Left 11/16/2020   COPD exacerbation ([place]) 12/09/2020   Coronary artery disease    Demand ischemia 03/26/2021   Diabetes mellitus without complication (HCC)    HLD (hyperlipidemia)    Hypertension    Long term (current) use of anticoagulants 12/29/2019   Malnutrition of moderate degree 05/09/2021   Peripheral arterial disease ([place])    Pneumonia due to COVID-19 virus 05/29/2021    Past Surgical History:  Procedure Laterality Date   ABDOMINAL AORTOGRAM W/LOWER EXTREMITY N/A 08/05/2020   Procedure: ABDOMINAL AORTOGRAM W/LOWER EXTREMITY;  Surgeon: Marty Heck, MD;  Location: [place] [facility];  Service: Cardiovascular;  Laterality: N/A;   ABDOMINAL AORTOGRAM W/LOWER EXTREMITY N/A 11/13/2020   Procedure: ABDOMINAL AORTOGRAM W/LOWER EXTREMITY;  Surgeon: Cherre Robins, MD;  Location: [place] [facility];  Service: Cardiovascular;  Laterality: N/A;   ABDOMINAL AORTOGRAM W/LOWER EXTREMITY N/A 05/12/2021   Procedure: ABDOMINAL AORTOGRAM W/LOWER  EXTREMITY;  Surgeon: Waynetta Sandy, MD;  Location: [place] [facility];  Service: Cardiovascular;  Laterality: N/A;   AMPUTATION Left 09/28/2019   Procedure: AMPUTATION BELOW KNEE;  Surgeon: Newt Minion, MD;  Location: [place];  Service: Orthopedics;  Laterality: Left;   AMPUTATION Right 11/15/2020   Procedure: RIGHT GREAT TOE AMPUTATION;  Surgeon: Newt Minion, MD;  Location: [place];  Service: Orthopedics;  Laterality: Right;   AMPUTATION Right 08/26/2021   Procedure: AMPUTATION 2nd TOE;  Surgeon: Waynetta Sandy, MD;  Location: [place];  Service: Vascular;  Laterality: Right;   CARDIAC CATHETERIZATION     CARDIOVERSION N/A 10/05/2019   Procedure: CARDIOVERSION;  Surgeon: Sanda Klein, MD;  Location: [place] [facility];  Service: Cardiovascular;  Laterality: N/A;   FEMORAL-POPLITEAL BYPASS GRAFT Right 08/07/2020   Procedure: RIGHT FEMORAL TO BELOW KNEE POPLITEAL ARTERY BYPASS;  Surgeon: Waynetta Sandy, MD;  Location: [place];  Service: Vascular;  Laterality: Right;   LEFT HEART CATH AND CORONARY ANGIOGRAPHY N/A 10/03/2019   Procedure: LEFT HEART CATH AND CORONARY ANGIOGRAPHY;  Surgeon: Lorretta Harp, MD;  Location: [place] [facility];  Service: Cardiovascular;  Laterality:  N/A;   PERIPHERAL VASCULAR INTERVENTION Right 08/05/2020   Procedure: PERIPHERAL VASCULAR INTERVENTION;  Surgeon: Marty Heck, MD;  Location: [place] [facility];  Service: Cardiovascular;  Laterality: Right;  common Iliac   PERIPHERAL VASCULAR INTERVENTION Left 11/13/2020   Procedure: PERIPHERAL VASCULAR INTERVENTION;  Surgeon: Cherre Robins, MD;  Location: [place] [facility];  Service: Cardiovascular;  Laterality: Left;   PERIPHERAL VASCULAR INTERVENTION Right 11/14/2020   Procedure: PERIPHERAL VASCULAR INTERVENTION;  Surgeon: Marty Heck, MD;  Location: [place] [facility];  Service: Cardiovascular;  Laterality: Right;  POP/SFA STENT   PERIPHERAL VASCULAR INTERVENTION  05/12/2021    Procedure: PERIPHERAL VASCULAR INTERVENTION;  Surgeon: Waynetta Sandy, MD;  Location: [place] [facility];  Service: Cardiovascular;;   RIGHT/LEFT HEART CATH AND CORONARY ANGIOGRAPHY N/A 08/25/2021   Procedure: RIGHT/LEFT HEART CATH AND CORONARY ANGIOGRAPHY;  Surgeon: Troy Sine, MD;  Location: [place] [facility];  Service: Cardiovascular;  Laterality: N/A;   TEE WITHOUT CARDIOVERSION N/A 10/05/2019   Procedure: TRANSESOPHAGEAL ECHOCARDIOGRAM (TEE);  Surgeon: Sanda Klein, MD;  Location: [facility];  Service: Cardiovascular;  Laterality: N/A;     Home Medications:  Prior to Admission medications   Medication Sig Start Date End Date Taking? Authorizing Provider  acetaminophen (TYLENOL) 500 MG tablet Take 1,000 mg by mouth every 6 (six) hours as needed for mild pain.   Yes [provider]  atorvastatin (LIPITOR) 80 MG tablet Take 1 tablet (80 mg total) by mouth daily. 08/30/21 04/04/22 Yes Merrily Brittle, DO  carvedilol (COREG) 12.5 MG tablet Take 12.5 mg by mouth daily. 12/18/20  Yes [provider]  clopidogrel (PLAVIX) 75 MG tablet TAKE 1 TABLET EVERY DAY WITH BREAKFAST Patient taking differently: Take 75 mg by mouth daily. 03/17/21  Yes Hensel, Jamal Collin, MD  fluticasone (FLONASE) 50 MCG/ACT nasal spray Place 2 sprays into both nostrils daily. Patient taking differently: Place 2 sprays into both nostrils as needed for allergies. 02/13/21  Yes Simmons-Robinson, Makiera, MD  furosemide (LASIX) 40 MG tablet TAKE 1 TABLET EVERY DAY Patient taking differently: Take 40 mg by mouth daily. 09/01/21  Yes Hensel, Jamal Collin, MD  gabapentin (NEURONTIN) 100 MG capsule Take 100 mg by mouth 2 (two) times daily.   Yes [provider]  insulin detemir (LEVEMIR) 100 UNIT/ML FlexPen Inject 15 Units into the skin daily. 07/26/21  Yes Maxwell, Allee, MD  ipratropium-albuterol (DUONEB) 0.5-2.5 (3) MG/3ML SOLN Take 3 mLs by nebulization every 4 (four) hours as needed. 05/19/21  Yes  Hensel, Jamal Collin, MD  metFORMIN (GLUCOPHAGE) 1000 MG tablet TAKE 1 TABLET EVERY DAY Patient taking differently: Take 1,000 mg by mouth daily. 09/15/21  Yes Hensel, Jamal Collin, MD  Nutritional Supplements (NUTRI-DRINK PO) Take 1 Bottle by mouth as needed (nutrition). Lactose free nutrition   Yes [provider]  pantoprazole (PROTONIX) 40 MG tablet Take 1 tablet by mouth twice daily 11/03/21  Yes Hensel, Jamal Collin, MD  warfarin (COUMADIN) 2.5 MG tablet Take 1 tablet (2.5 mg total) by mouth daily. Patient taking differently: Take 5 mg by mouth See admin instructions. Take half a tablet (2.5 mg) by mouth daily. 07/29/21 07/29/22 Yes Eppie Gibson, MD  albuterol (VENTOLIN HFA) 108 (90 Base) MCG/ACT inhaler INHALE 2 PUFFS INTO THE LUNGS EVERY 6 (SIX) HOURS AS NEEDED FOR WHEEZING OR SHORTNESS OF BREATH. Patient not taking: Reported on 02/12/2022 04/24/21   [name], MD  enoxaparin (LOVENOX) 100 MG/ML injection Inject 1 mL (100 mg total)  into the skin 2 (two) times daily for 10 days. Patient not taking: Reported on 02/12/2022 08/30/21 04/04/22  Merrily Brittle, DO  Fluticasone-Umeclidin-Vilant (TRELEGY ELLIPTA) 100-62.5-25 MCG/ACT AEPB Inhale 1 puff into the lungs daily. Patient not taking: Reported on 02/12/2022 06/01/21   Eppie Gibson, MD  glucose blood (RELION TRUE METRIX TEST STRIPS) test strip Use to test blood sugar three times per day. 11/20/19   [name], MD  losartan (COZAAR) 25 MG tablet Take 1/2 tablet (12.5 mg total) by mouth daily. Patient not taking: Reported on 02/12/2022 08/30/21 09/29/21  Merrily Brittle, DO    Inpatient Medications: Scheduled Meds:  atorvastatin  80 mg Oral Daily   Chlorhexidine Gluconate Cloth  6 each Topical Daily   clopidogrel  75 mg Oral Daily   docusate sodium  100 mg Oral BID   enoxaparin (LOVENOX) injection  90 mg Subcutaneous BID   furosemide  40 mg Oral Daily   insulin aspart  0-20 Units Subcutaneous TID WC   insulin aspart  0-5 Units  Subcutaneous QHS   insulin detemir  15 Units Subcutaneous Daily   mouth rinse  15 mL Mouth Rinse 4 times per day   pantoprazole  40 mg Oral Daily   polyethylene glycol  17 g Oral Daily   warfarin  7.5 mg Oral ONCE-1600   Warfarin - Pharmacist Dosing Inpatient   Does not apply q1600   Continuous Infusions:  sodium chloride 5 mL/hr at 02/16/22 1716   cefTRIAXone (ROCEPHIN)  IV 2 g (02/16/22 1720)   PRN Meds: sodium chloride, docusate sodium, etomidate, ipratropium-albuterol, mouth rinse, mouth rinse, polyethylene glycol  Allergies:   No Known Allergies  Social History:   Social History   Socioeconomic History   Marital status: Widowed    Spouse name: Not on file   Number of children: Not on file   Years of education: Not on file   Highest education level: Not on file  Occupational History   Not on file  Tobacco Use   Smoking status: Former    Packs/day: 0.50    Years: 50.00    Total pack years: 25.00    Types: Cigarettes    Start date: 04/09/2021    Quit date: 07/24/2021    Years since quitting: 0.5   Smokeless tobacco: Never  Vaping Use   Vaping Use: Never used  Substance and Sexual Activity   Alcohol use: Yes    Alcohol/week: 6.0 standard drinks of alcohol    Types: 6 Standard drinks or equivalent per week    Comment: 11/07/20 - states he has not drank in 6 months   Drug use: Yes    Types: Cocaine, Marijuana   Sexual activity: Yes    Partners: Female    Comment: monagamous stable relationship  Other Topics Concern   Not on file  Social History Narrative   Not on file   Social Determinants of Health   Financial Resource Strain: Not on file  Food Insecurity: No Food Insecurity (12/18/2021)   Hunger Vital Sign    Worried About Running Out of Food in the Last Year: Never true    Ran Out of Food in the Last Year: Never true  Transportation Needs: Unmet Transportation Needs (08/14/2021)   PRAPARE - Hydrologist (Medical): Yes    Lack  of Transportation (Non-Medical): No  Physical Activity: Not on file  Stress: Not on file  Social Connections: Not on file  Intimate Partner Violence: Not  on file    Family History:    Family History  Problem Relation Age of Onset   Alcoholism Mother    Alcoholism Father      ROS:  Please see the history of present illness.   All other ROS reviewed and negative.     Physical Exam/Data:   Vitals:   02/16/22 1610 02/16/22 1935 02/16/22 2247 02/17/22 0400  BP: (!) 140/84 135/89 139/78 139/86  Pulse: 74 79 76 88  Resp: _0 Temp: 98.2 F (36.8 C) 98.1 F (36.7 C) 98 F (36.7 C) 98.2 F (36.8 C)  TempSrc: Oral Oral Oral Oral  SpO2: 97% 100% 100% 100%  Weight:    89.4 kg  Height:        Intake/Output Summary (Last 24 hours) at 02/17/2022 0815 Last data filed at 02/17/2022 0300 Gross per 24 hour  Intake --  Output 2075 ml  Net -2075 ml      02/17/2022    4:00 AM 02/16/2022    3:22 AM 02/13/2022    4:50 AM  Last 3 Weights  Weight (lbs) 197 lb 1.5 oz 214 lb 8.1 oz 205 lb 11 oz  Weight (kg) 89.4 kg 97.3 kg 93.3 kg     Body mass index is 28.28 kg/m.  General: Chronically ill-appearing HEENT: normal Neck: no JVD Cardiac:  normal S1, S2; RRR; harsh 3 out of 6 systolic ejection murmur Lungs:  clear to auscultation bilaterally, no wheezing, rhonchi or rales  Abd: soft, nontender, no hepatomegaly  Ext: no edema Musculoskeletal: Status post left BKA.  Status post several toe amputations and right foot Skin: warm and dry  Neuro:  CNs 2-12 intact, no focal abnormalities noted Psych:  Normal affect   EKG:  The EKG was personally reviewed and demonstrates:  sinus with HR 87, poor R wave progression Telemetry:  Telemetry was personally reviewed and demonstrates:  afib, rate well controlled   Relevant CV Studies: Echo 02/12/22 IMPRESSIONS   1. Heavily calcified aortic valve with severe AS (mean gradient 29 mmHg;  AVA 0.7 cm2; DI 0.18); mean gradient 06/23/21  documented at 43 mmHg.   2. Left ventricular ejection fraction, by estimation, is 60 to 65%. The  left ventricle has normal function. The left ventricle has no regional  wall motion abnormalities. The left ventricular internal cavity size was  mildly dilated. There is moderate  concentric left ventricular hypertrophy. Left ventricular diastolic  function could not be evaluated. Elevated left atrial pressure.   3. Right ventricular systolic function is normal. The right ventricular  size is normal.   4. Left atrial size was moderately dilated.   5. Right atrial size was mildly dilated.   6. The mitral valve is normal in structure. Mild mitral valve  regurgitation. No evidence of mitral stenosis. Severe mitral annular  calcification.   7. The aortic valve is calcified. Aortic valve regurgitation is mild.  Severe aortic valve stenosis.   8. Aortic dilatation noted. There is borderline dilatation of the  ascending aorta, measuring 39 mm.   9. The inferior vena cava is normal in size with greater than 50%  respiratory variability, suggesting right atrial pressure of 3 mmHg.     Laboratory Data:  High Sensitivity Troponin:   Recent Labs  Lab 02/12/22 0519 02/12/22 0820  TROPONINIHS 49* 135*     Chemistry Recent Labs  Lab 02/13/22 0510 02/15/22 0311 02/16/22 0559  NA 139 136 135  K 4.3 4.2 3.8  CL  102 99 96*  CO2 _0 GLUCOSE 170* 141* 120*  BUN 45* 38* 41*  CREATININE 2.01* 1.48* 1.34*  CALCIUM 8.8* 9.0 8.8*  GFRNONAA 35* 51* 57*  ANIONGAP _1 Recent Labs  Lab 02/12/22 0519  PROT 7.6  ALBUMIN 3.4*  AST 16  ALT 11  ALKPHOS 72  BILITOT 0.3   Hematology Recent Labs  Lab 02/15/22 0311 02/16/22 0559 02/17/22 0056  WBC 10.2 9.3 8.4  RBC 2.96* 3.19* 3.48*  HGB 9.2* 9.8* 10.6*  HCT 27.1* 28.0* 31.1*  MCV 91.6 87.8 89.4  MCH 31.1 30.7 30.5  MCHC 33.9 35.0 34.1  RDW 12.9 12.6 12.6  PLT 227 244 244   BNP Recent Labs  Lab 02/12/22 0519  02/14/22 0950  BNP 475.3* 1,439.0*    DDimer No results for input(s): "DDIMER" in the last 168 hours.   Radiology/Studies:  No results found.    Procedure Type: CABG + AVR Perioperative Outcome Estimate % Operative Mortality 10.1% Morbidity & Mortality 27.9% Stroke 2.29% Renal Failure 13.3% Reoperation 4.99% Prolonged Ventilation 19.6% Deep Sternal Wound Infection 0.749% [place] Hospital Stay (>14 days) 28% Short Hospital Stay (<6 days)* 9.54%    Assessment and Plan:   Severe AS: repeat echo this admission shows preserved LV function with severe AS, although mean gradient only in 20s (previously 16s).    He was seen by the multidisciplinary valve team during most recent admission and felt to be a marginal candidate for TAVR but not prohibitive. Pre TAVR CTs showed anatomy suitable for a 29 mm Edwards S3UR via the left subclavian approach. Additionally, CTs showed enlarged mediastinal lymph nodes as noted on a previous study which recommended a follow up PET scan in 4-6 weeks. It was felt that he would require amputation of gangrenous right second toe in order to be considered for TAVR and this was successfully completed on 08/26/21. He did follow up with [facility] and this was healing appropriately. Additionally, orthopantogram showed poor dentition and it was recommended he proceed with dental evaluation and likely extraction to mitigate risk of bacterial endocarditis. He has not been able to see a dentist due to prohibitive cost.    The patient had several follow up appointments made with cardiology but no showed to all of the apts.   He is now readmitted for acute respiratory failure requiring intubation. He is now extubated. Being treated with Abx, steroids and nebs. UDS + for cocaine and THC. Admits to drug use with no plans to quit.   I think he is a poor candidate for TAVR given overall health, poor functional status (wheelchair bound), high infection risk, severe PAD, noncompliance,  and ongoing substance abuse. He has a STS predicted mortality score of 10% with isolated AVR. We discussed this case in depth at our multidisciplinary valve team meeting and feel he is at prohibitive risk for TAVR at this time. Plan to continue medical therapy and follow up with Dr. Audie Box.  For questions or updates, please contact [place] Please consult [email or web address] for contact info under    Signed, Angelena Form, PA-C  02/17/2022 8:15 AM  Patient seen, examined. Available data reviewed. Agree with findings, assessment, and plan as outlined by Nell Range, PA-C.  The patient is independently interviewed and examined.  His daughter is at the bedside.  He is readmitted again with acute on chronic respiratory failure.  I saw the patient in formal consultation in April of this year.  The  patient reports that he is feeling better since undergoing diuresis and medical therapy during this admission.  On exam, he is an elderly male in poor health, but in no acute distress.  HEENT is notable for poor dentition.  Carotid upstrokes are brisk with bilateral bruits.  JVP is normal.  Lungs with coarse rhonchi bilaterally.  Heart is irregularly irregular with a grade 3/6 harsh late peaking systolic murmur best heard at the apical area.  Abdomen is soft and nontender.  Extremities with a left BKA and right great toe amputation.  There is no peripheral edema.  The patient has normal LV systolic function with LVEF 60 to 65%, moderate LVH, and severe aortic stenosis with a mean transvalvular gradient of 29 mmHg, but aortic valve area calculated at 0.7 cm and dimensionless index of 0.18.  His aortic stenosis also appears to be severe on his physical examination.  The patient is a very poor candidate for any aortic valve intervention.  He is physically debilitated with a left BKA, right great toe amputation, and severe peripheral arterial disease.  He has severe lung disease and recurrent hypoxemic  respiratory failure.  He also has active drug use with continued use of cocaine.  I do not think he would do well with TAVR and as risk of complication would likely outweigh the potential benefit.  He understands these issues and also understands that his treatment is limited to palliative medical therapy at this time.  Sherren Mocha, M.D. 02/17/2022 11:06 AM

## 2022-02-17 NOTE — Progress Notes (Signed)
Nutrition Follow-up  DOCUMENTATION CODES:   Not applicable  INTERVENTION:  - no interventions at this time.   NUTRITION DIAGNOSIS:   Inadequate oral intake related to inability to eat as evidenced by NPO status.  GOAL:   Patient will meet greater than or equal to 90% of their needs - Met.  MONITOR:   PO intake  REASON FOR ASSESSMENT:   Ventilator, Consult Enteral/tube feeding initiation and management (trickle tube feeds)  ASSESSMENT:   71 year old male who presented to the ED on 10/19 with SOB. Pt required intubation in the ED. PMH of COPD, atrial fibrillation, CAD, T2DM, PAD, s/p L BKA, CHF, CKD stage IIIa, HTN, HLD. Pt admitted with acute COPD exacerbation, AKI.  Meds include: Colace, lasix, sliding scale insulin, Levemir (15 units), miralax, coumadin. Labs reviewed.   Pt has now been extubated and diet advanced. Pt states that he ate all of his breakfast this am. He also reports that he is familiar with his diet and has been following it for years. Pt is planning to discharge today.   Diet Order:   Diet Order             Diet heart healthy/carb modified Room service appropriate? Yes; Fluid consistency: Thin  Diet effective now                   EDUCATION NEEDS:   Education needs have been addressed  Skin:  Skin Assessment: Reviewed RN Assessment  Last BM:  02/11/22  Height:   Ht Readings from Last 1 Encounters:  02/12/22 5' 10"  (1.778 m)    Weight:   Wt Readings from Last 1 Encounters:  02/17/22 89.4 kg    Ideal Body Weight:  75.5 kg  BMI:  Body mass index is 28.28 kg/m.  Estimated Nutritional Needs:   Kcal:  2000-2200  Protein:  115-135 grams  Fluid:  >1.8 L  Thalia Bloodgood, RD, LDN, CNSC

## 2022-02-17 NOTE — Progress Notes (Signed)
[place] for Lovenox / Coumadin Indication: atrial fibrillation  No Known Allergies  Patient Measurements: Height: '5\' 10"'$  (177.8 cm) Weight: 89.4 kg (197 lb 1.5 oz) IBW/kg (Calculated) : 73  Vital Signs: Temp: 98.2 F (36.8 C) (10/24 0400) Temp Source: Oral (10/24 0400) BP: 139/86 (10/24 0400) Pulse Rate: 88 (10/24 0400)  Labs: Recent Labs    02/15/22 0311 02/16/22 0559 02/17/22 0056  HGB 9.2* 9.8* 10.6*  HCT 27.1* 28.0* 31.1*  PLT 227 244 244  LABPROT 17.3* 19.1* 18.9*  INR 1.4* 1.6* 1.6*  CREATININE 1.48* 1.34*  --     Estimated Creatinine Clearance: 57.8 mL/min (A) (by C-G formula based on SCr of 1.34 mg/dL (H)).   Assessment: 83 yoM presented with shortness of breath from COPD exacerbation. Pharmacy consulted to dose warfarin for patient's atrial fibrillation on 10/20. Patient has been off and on anticoagulation, but confirmed with the patient that he is taking his warfarin.  Pharmacy consulted to add Lovenox bridge 10/22 since patient is in Afib with sub-therapeutic INR.   Home warfarin dose:  2.'5mg'$  PO daily  Note: Records outpatient anticoag clinic from 2022 (last 02/13/21) have patient taking ~40-45 mg/week to achieve therapeutic INR with alternating dose of '5mg'$  or 7.'5mg'$ )  INR 1.6, remains subtherapeutic SCr 1.34, trending down Hgb 10.6 and Plt 244, stable  Goal of Therapy:  INR 2-3 Anti-Xa level 0.6-1 units/ml 4hrs after LMWH dose given Monitor platelets by anticoagulation protocol: Yes   Plan:  Continue Lovenox '90mg'$  SQ Q12H Increase warfarin dose to 7.'5mg'$  PO today Daily PT / INR and CBC Check LMWH level as needed Monitor for s/sx of bleeding daily  Luisa Hart, PharmD, BCPS Clinical Pharmacist 02/17/2022 7:42 AM   Please refer to AMION for pharmacy phone number

## 2022-02-17 NOTE — Progress Notes (Signed)
Rounding Note    Patient Name: Rodney [name] Date of Encounter: 02/17/2022  [place] Cardiologist: Evalina Field, MD   Subjective   INR still 1.6, but remains subtherapeutic on lovenox. Wants to go home.  Inpatient Medications    Scheduled Meds:  atorvastatin  80 mg Oral Daily   Chlorhexidine Gluconate Cloth  6 each Topical Daily   clopidogrel  75 mg Oral Daily   docusate sodium  100 mg Oral BID   enoxaparin (LOVENOX) injection  90 mg Subcutaneous BID   furosemide  40 mg Oral Daily   insulin aspart  0-20 Units Subcutaneous TID WC   insulin aspart  0-5 Units Subcutaneous QHS   insulin detemir  15 Units Subcutaneous Daily   mouth rinse  15 mL Mouth Rinse 4 times per day   pantoprazole  40 mg Oral Daily   polyethylene glycol  17 g Oral Daily   warfarin  7.5 mg Oral ONCE-1600   Warfarin - Pharmacist Dosing Inpatient   Does not apply q1600   Continuous Infusions:  sodium chloride 5 mL/hr at 02/16/22 1716   cefTRIAXone (ROCEPHIN)  IV 2 g (02/16/22 1720)   PRN Meds: sodium chloride, docusate sodium, etomidate, ipratropium-albuterol, mouth rinse, mouth rinse, polyethylene glycol   Vital Signs    Vitals:   02/16/22 1610 02/16/22 1935 02/16/22 2247 02/17/22 0400  BP: (!) 140/84 135/89 139/78 139/86  Pulse: 74 79 76 88  Resp: '20 20 19 20  '$ Temp: 98.2 F (36.8 C) 98.1 F (36.7 C) 98 F (36.7 C) 98.2 F (36.8 C)  TempSrc: Oral Oral Oral Oral  SpO2: 97% 100% 100% 100%  Weight:    89.4 kg  Height:        Intake/Output Summary (Last 24 hours) at 02/17/2022 0800 Last data filed at 02/17/2022 0300 Gross per 24 hour  Intake --  Output 2075 ml  Net -2075 ml      02/17/2022    4:00 AM 02/16/2022    3:22 AM 02/13/2022    4:50 AM  Last 3 Weights  Weight (lbs) 197 lb 1.5 oz 214 lb 8.1 oz 205 lb 11 oz  Weight (kg) 89.4 kg 97.3 kg 93.3 kg      Telemetry    Afib with rates 70-90s - Personally Reviewed  ECG    No new ECG - Personally  Reviewed  Physical Exam   GEN: No acute distress.   Neck: No JVD Cardiac: irregularly irregular, 3/6 systolic ejection murmur at RUSB Respiratory: Scattered wheezing GI: Soft, nontender, non-distended  MS: No edema; S/p L BKA Neuro:  Nonfocal  Psych: Normal affect   Labs    High Sensitivity Troponin:   Recent Labs  Lab 02/12/22 0519 02/12/22 0820  TROPONINIHS 49* 135*     Chemistry Recent Labs  Lab 02/12/22 0519 02/12/22 0527 02/13/22 0510 02/15/22 0311 02/16/22 0559  NA 138   < > 139 136 135  K 5.0   < > 4.3 4.2 3.8  CL 99   < > 102 99 96*  CO2 27  --  '26 27 29  '$ GLUCOSE 245*   < > 170* 141* 120*  BUN 30*   < > 45* 38* 41*  CREATININE 1.94*   < > 2.01* 1.48* 1.34*  CALCIUM 9.0  --  8.8* 9.0 8.8*  MG  --   --   --  1.7  --   PROT 7.6  --   --   --   --  ALBUMIN 3.4*  --   --   --   --   AST 16  --   --   --   --   ALT 11  --   --   --   --   ALKPHOS 72  --   --   --   --   BILITOT 0.3  --   --   --   --   GFRNONAA 37*  --  35* 51* 57*  ANIONGAP 12  --  '11 10 10   '$ < > = values in this interval not displayed.    Lipids  Recent Labs  Lab 02/13/22 0510  TRIG 132    Hematology Recent Labs  Lab 02/15/22 0311 02/16/22 0559 02/17/22 0056  WBC 10.2 9.3 8.4  RBC 2.96* 3.19* 3.48*  HGB 9.2* 9.8* 10.6*  HCT 27.1* 28.0* 31.1*  MCV 91.6 87.8 89.4  MCH 31.1 30.7 30.5  MCHC 33.9 35.0 34.1  RDW 12.9 12.6 12.6  PLT 227 244 244   Thyroid No results for input(s): "TSH", "FREET4" in the last 168 hours.  BNP Recent Labs  Lab 02/12/22 0519 02/14/22 0950  BNP 475.3* 1,439.0*    DDimer No results for input(s): "DDIMER" in the last 168 hours.   Radiology    No results found.  Cardiac Studies     Patient Profile     71 y.o. 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    He was seen by the multidisciplinary valve team during most recent admission and felt to be a marginal candidate for TAVR but not prohibitive. Pre TAVR CTs showed anatomy suitable for a 29 mm Edwards S3UR via the left subclavian approach. Additionally, CTs showed enlarged mediastinal lymph nodes as noted on a previous study which recommended a follow up PET scan in 4-6 weeks. It was felt that he would require amputation of gangrenous right second toe in order to be considered for TAVR and this was successfully completed on 08/26/21. He did follow up with [facility] and this was healing appropriately. Additionally, orthopantogram showed poor dentition and it was recommended he proceed with dental evaluation and likely extraction to mitigate risk of bacterial endocarditis. He has not been able to see a dentist due to prohibitive cost.    The patient had several follow up appointments made with cardiology but no showed to all of the apts.   He is now readmitted for acute respiratory failure requiring intubation. He is now extubated. Being treated with Abx, steroids and nebs. UDS + for cocaine and THC. Admits to drug use with no plans to quit.   -D/w multidisciplinary valve team - he is not considered a candidate for TAVR, they will leave formal recs today.   Acute on chronic diastolic CHF: Was receiving IV lasix, now transitioned to home lasix '40mg'$  daily. Creat improving.  CAD: Has occluded RCA with 50 to 60% stenosis in LAD and 60% LCx disease.   Managed medically.  Continue Plavix, atorvastatin  Atrial fibrillation: on warfarin per pharmacy.  INR 1.6.  Currently in Afib, rates well controlled.  Start heparin gtt while INR subtherapeutic -target INR 2-3 - Would be ok to d/c home with lovenox shots if he is willing - needs INR check probably Friday in the office  Acute hypoxic/hypercarbic respiratory failure: Required intubation on admission.Treated for COPD exacerbation, on antibiotics/steroids/DuoNebs.  Improving, now weaned to [place] per cardiology to d/c home today. Follow-up with Dr. Audie Box,   For questions or updates, please contact [place] Please consult [email or web address] for contact info under   Pixie Casino, MD, FACC, [place] Director of the [facility]  [facility] Diplomate of the American Board of Clinical Lipidology Attending Cardiologist  Direct Dial: [phone]  Fax: [phone]  Website:  [email or web address]  Pixie Casino, MD  02/17/2022, 8:00 AM

## 2022-02-17 NOTE — Plan of Care (Signed)

## 2022-02-18 ENCOUNTER — Telehealth: Payer: Self-pay | Admitting: *Deleted

## 2022-02-18 NOTE — Patient Outreach (Signed)
  Care Coordination [facility] Note Transition Care Management Unsuccessful Follow-up Telephone Call  Date of discharge and from where:  [facility] [phone]  Attempts:  2nd Attempt  Reason for unsuccessful TCM follow-up call:  Voice mail full   [facility] [phone]

## 2022-02-18 NOTE — Patient Outreach (Signed)
  Care Coordination [facility] Note Transition Care Management Unsuccessful Follow-up Telephone Call  Date of discharge and from where:  [facility] [phone]  Attempts:  1st Attempt  Reason for unsuccessful TCM follow-up call:  Voice mail full   [place] Management [phone]

## 2022-02-19 ENCOUNTER — Telehealth: Payer: Self-pay | Admitting: *Deleted

## 2022-02-19 NOTE — Patient Outreach (Signed)
  Care Coordination [facility] Note Transition Care Management Unsuccessful Follow-up Telephone Call  Date of discharge and from where:  [id number] [facility]  Attempts:  3rd Attempt  Reason for unsuccessful TCM follow-up call:  Voice mail full   [place] Management [phone]

## 2022-03-02 ENCOUNTER — Ambulatory Visit: Payer: Medicare PPO | Admitting: Physician Assistant

## 2022-03-05 ENCOUNTER — Telehealth: Payer: Self-pay | Admitting: Cardiovascular Disease

## 2022-03-05 DIAGNOSIS — I480 Paroxysmal atrial fibrillation: Secondary | ICD-10-CM

## 2022-03-05 MED ORDER — ENOXAPARIN SODIUM 100 MG/ML IJ SOSY: PREFILLED_SYRINGE | INTRAMUSCULAR | 0 refills | Status: DC

## 2022-03-05 NOTE — Telephone Encounter (Signed)
Spoke with [name] and advised that patient will need to continue Lovenox and warfarin. [name] says she can not afford Lovenox. Downloaded goodrx card and sent to her and sent to [facility] in [place]. Scheduled INR check for next Thursday due to patients transportation needs.

## 2022-03-05 NOTE — Telephone Encounter (Signed)
Advised [place] (significant other) to be placed on patient's DPR next time they are in clinic. [name] stated when patient was D/C from hospital, He was to take one last dose of warfarin 7.'5mg'$ . He was to take Lovenox '90mg'$  injections twice daily for 4 days. Since 10/29, pateint has not been on blood thinners. He is taking Plavix '75mg'$  daily. Spoke with PharmD K. Alvstad who ordered warfarin 7.'5mg'$  daily for three days; then warfarin '5mg'$  each day except MWF, take 7.5 mg. Continue with Lovenox '100mg'$  injection every 12 hours. Come to coumadin clinic on Nov. 14, 2023. Debbeisaid they cannot afford lovenox. Spoke with Burna Forts PharmD, who will call patient

## 2022-03-05 NOTE — Telephone Encounter (Signed)
Pt c/o medication issue:  1. Name of Medication: warfarin (COUMADIN) 7.5 MG tablet   2. How are you currently taking this medication (dosage and times per day)?   3. Are you having a reaction (difficulty breathing--STAT)?   4. What is your medication issue? Pt is requesting call back to discuss this medication and when to continue taking.

## 2022-03-10 ENCOUNTER — Ambulatory Visit: Payer: Medicare PPO | Admitting: Family Medicine

## 2022-03-11 ENCOUNTER — Telehealth: Payer: Self-pay | Admitting: Cardiovascular Disease

## 2022-03-11 NOTE — Telephone Encounter (Signed)
Called pt and [name] back; NO DPR so spoke with pt to get permission to speak with her and he gave verbal consent since she takes of his medical needs. Advised that we do need to see the pt since he has resumed warfarin and she stated that the pt will be going to PCP next week to have them continue manage his warfarin like they were doing in the past since they are having transportation issues and he has to go there so often. Advised that it fine but we want to ensure he does get it checked as this med is a monitored medication. She stated she will ensure it gets checked and they monitor it and continue refills since they are going to be managed at pcp.

## 2022-03-11 NOTE — Telephone Encounter (Signed)
Routing to [employer].

## 2022-03-11 NOTE — Telephone Encounter (Signed)
Wife called stating they have transportation issues and had to cancel the patient's INR check for 11/16.  Wife stated the patient will have his INR checked at his doctor's visit on 11/21.

## 2022-03-12 ENCOUNTER — Ambulatory Visit: Payer: Medicare PPO

## 2022-03-16 NOTE — Patient Instructions (Incomplete)
It was nice seeing you today!  Blood work today.  See me in 3 months or whenever is a good for you.  Stay well, Harutyun Monteverde, MD [facility] [phone]  --  Make sure to check out at the front desk before you leave today.  Please arrive at least 15 minutes prior to your scheduled appointments.  If you had blood work today, I will send you a MyChart message or a letter if results are normal. Otherwise, I will give you a call.  If you had a referral placed, they will call you to set up an appointment. Please give us a call if you don't hear back in the next 2 weeks.  If you need additional refills before your next appointment, please call your pharmacy first.  

## 2022-03-16 NOTE — Progress Notes (Deleted)
    SUBJECTIVE:   CHIEF COMPLAINT / HPI:  No chief complaint on file.   Patient was recently admitted about 1 month ago from 10/19 to 10/24 for respiratory failure requiring intubation and ICU level of care.  He was extubated to BiPAP on 10/20.  He was treated for COPD exacerbation with antibiotics, steroids, and nebulizers.  Appears patient refused PT and OT while hospitalized.  Cardiology was consulted due to previous work-up for TAVR which was not completed due to nonadherence, and he was felt to be a high risk candidate for procedure given infection risk due to dentition as well as ongoing substance abuse.  UDS was positive for cocaine and THC.  PERTINENT  PMH / PSH: COPD, HTN, HLD, CAD, paroxysmal A-fib on warfarin, T2DM, CKD 3, HFpEF, severe AS, PAD s/p left BKA, ascending aortic root dilation, substance abuse  Patient Care Team: [name], MD as PCP - General O'Neal, Cassie Freer, MD as PCP - Cardiology (Cardiology) Lazaro Arms, RN as Case Manager   OBJECTIVE:   There were no vitals taken for this visit.  Physical Exam      05/19/2021   11:15 AM  Depression screen PHQ 2/9  Decreased Interest 0  Down, Depressed, Hopeless 0  PHQ - 2 Score 0  Altered sleeping 0  Tired, decreased energy 0  Change in appetite 0  Feeling bad or failure about yourself  0  Trouble concentrating 0  Moving slowly or fidgety/restless 0  Suicidal thoughts 0  PHQ-9 Score 0     {Show previous vital signs (optional):23777}  {Labs  Heme  Chem  Endocrine  Serology  Results Review (optional):23779}  ASSESSMENT/PLAN:   No problem-specific Assessment & Plan notes found for this encounter.    No follow-ups on file.   Zola Button, MD [place]

## 2022-03-17 ENCOUNTER — Ambulatory Visit: Payer: Medicare PPO | Admitting: Family Medicine

## 2022-03-24 ENCOUNTER — Ambulatory Visit: Payer: Medicare PPO | Admitting: Family Medicine

## 2022-03-30 ENCOUNTER — Other Ambulatory Visit: Payer: Self-pay

## 2022-03-30 DIAGNOSIS — I4892 Unspecified atrial flutter: Secondary | ICD-10-CM

## 2022-03-30 MED ORDER — WARFARIN SODIUM 5 MG PO TABS: ORAL_TABLET | ORAL | 3 refills | Status: DC

## 2022-03-30 NOTE — Telephone Encounter (Signed)
Received refill request for Warfarin.  Called and spoke to wife [name].  Ongoing transportation issues.  He and she both know warfarin is dangerous and must be monitored.  Needs FU appointment ASAP.  Refilled warfarin as requested.

## 2022-04-06 ENCOUNTER — Ambulatory Visit: Payer: Medicare PPO | Admitting: Cardiovascular Disease

## 2022-04-23 ENCOUNTER — Ambulatory Visit: Payer: Medicare PPO | Admitting: Family Medicine

## 2022-04-26 NOTE — Progress Notes (Deleted)
Cardiology Clinic Note   Patient Name: Rodney [name] Date of Encounter: 04/26/2022  Primary Care Provider:  [name], MD Primary Cardiologist:  Evalina Field, MD  Patient Profile    Rodney [name] is a 71 y.o. male with a past medical history of CAD per LHC, PAF on anticoagulation, chronic diastolic heart failure, dilatation of ascending aorta, aortic stenosis, PVD s/p left BKA, PAD s/p multiple right lower extremity interventions, hypertension, hyperlipidemia, T2DM, CKD stage III, COPD, tobacco abuse  who presents to the clinic today for hospital follow-up from October 2023.   Past Medical History    Past Medical History:  Diagnosis Date   Acute combined systolic and diastolic congestive heart failure (HCC)    Acute on chronic heart failure with preserved ejection fraction (HFpEF) (HCC)    Acute respiratory failure with hypoxia (HCC)    Acute respiratory failure with hypoxia (HCC)    Aortic stenosis    moderate in 2022   Atrial fibrillation (HCC)    CHF (congestive heart failure) (HCC)    Chronic pleural effusion, Left 11/16/2020   COPD exacerbation ([place]) 12/09/2020   Coronary artery disease    Demand ischemia 03/26/2021   Diabetes mellitus without complication (HCC)    HLD (hyperlipidemia)    Hypertension    Long term (current) use of anticoagulants 12/29/2019   Malnutrition of moderate degree 05/09/2021   Peripheral arterial disease ([place])    Pneumonia due to COVID-19 virus 05/29/2021   Past Surgical History:  Procedure Laterality Date   ABDOMINAL AORTOGRAM W/LOWER EXTREMITY N/A 08/05/2020   Procedure: ABDOMINAL AORTOGRAM W/LOWER EXTREMITY;  Surgeon: Marty Heck, MD;  Location: [facility];  Service: Cardiovascular;  Laterality: N/A;   ABDOMINAL AORTOGRAM W/LOWER EXTREMITY N/A 11/13/2020   Procedure: ABDOMINAL AORTOGRAM W/LOWER EXTREMITY;  Surgeon: Cherre Robins, MD;  Location: [facility];  Service: Cardiovascular;  Laterality:  N/A;   ABDOMINAL AORTOGRAM W/LOWER EXTREMITY N/A 05/12/2021   Procedure: ABDOMINAL AORTOGRAM W/LOWER EXTREMITY;  Surgeon: Waynetta Sandy, MD;  Location: [facility];  Service: Cardiovascular;  Laterality: N/A;   AMPUTATION Left 09/28/2019   Procedure: AMPUTATION BELOW KNEE;  Surgeon: Newt Minion, MD;  Location: [facility];  Service: Orthopedics;  Laterality: Left;   AMPUTATION Right 11/15/2020   Procedure: RIGHT GREAT TOE AMPUTATION;  Surgeon: Newt Minion, MD;  Location: [facility];  Service: Orthopedics;  Laterality: Right;   AMPUTATION Right 08/26/2021   Procedure: AMPUTATION 2nd TOE;  Surgeon: Waynetta Sandy, MD;  Location: [facility];  Service: Vascular;  Laterality: Right;   CARDIAC CATHETERIZATION     CARDIOVERSION N/A 10/05/2019   Procedure: CARDIOVERSION;  Surgeon: Sanda Klein, MD;  Location: [facility];  Service: Cardiovascular;  Laterality: N/A;   FEMORAL-POPLITEAL BYPASS GRAFT Right 08/07/2020   Procedure: RIGHT FEMORAL TO BELOW KNEE POPLITEAL ARTERY BYPASS;  Surgeon: Waynetta Sandy, MD;  Location: [facility];  Service: Vascular;  Laterality: Right;   LEFT HEART CATH AND CORONARY ANGIOGRAPHY N/A 10/03/2019   Procedure: LEFT HEART CATH AND CORONARY ANGIOGRAPHY;  Surgeon: Lorretta Harp, MD;  Location: [facility];  Service: Cardiovascular;  Laterality: N/A;   PERIPHERAL VASCULAR INTERVENTION Right 08/05/2020   Procedure: PERIPHERAL VASCULAR INTERVENTION;  Surgeon: Marty Heck, MD;  Location: [facility];  Service: Cardiovascular;  Laterality: Right;  common Iliac   PERIPHERAL VASCULAR INTERVENTION Left 11/13/2020   Procedure: PERIPHERAL VASCULAR INTERVENTION;  Surgeon: Cherre Robins, MD;  Location: [facility];  Service: Cardiovascular;  Laterality: Left;   PERIPHERAL VASCULAR INTERVENTION Right 11/14/2020   Procedure: PERIPHERAL VASCULAR INTERVENTION;  Surgeon: Marty Heck, MD;  Location: [facility];  Service:  Cardiovascular;  Laterality: Right;  POP/SFA STENT   PERIPHERAL VASCULAR INTERVENTION  05/12/2021   Procedure: PERIPHERAL VASCULAR INTERVENTION;  Surgeon: Waynetta Sandy, MD;  Location: [facility];  Service: Cardiovascular;;   RIGHT/LEFT HEART CATH AND CORONARY ANGIOGRAPHY N/A 08/25/2021   Procedure: RIGHT/LEFT HEART CATH AND CORONARY ANGIOGRAPHY;  Surgeon: Troy Sine, MD;  Location: [facility];  Service: Cardiovascular;  Laterality: N/A;   TEE WITHOUT CARDIOVERSION N/A 10/05/2019   Procedure: TRANSESOPHAGEAL ECHOCARDIOGRAM (TEE);  Surgeon: Sanda Klein, MD;  Location: [facility];  Service: Cardiovascular;  Laterality: N/A;    Allergies  No Known Allergies  History of Present Illness    Rodney [name] has a past medical history of: CAD.  LHC 10/03/2019: Proximal RCA 100% chronically occluded.  R/LHC 08/25/2021: Mid LAD 50%, ostial to proximal RCA 70%, mild to distal RCA 100%, ostial to proximal LAD 20%, proximal to mid Cx 60%, OM1 60%. Moderate pulmonary hypertension. Moderate aortic valve stenosis. Moderate elevation of right heart pressures with moderate pulmonary artery stenosis. Severely calcified mitral annulus.  PAF.  Chronic diastolic heart failure. Echo 09/28/2019: EF 35-40%. Global hypokinesis. Mildly reduced right ventricular function. Mildly dilated left and right atria. Mild MR. Moderate aortic valve stenosis.   Echo 07/30/2020: EF 50-55%. Moderate LVH. Grade II DD. Moderately RV. Severely dilated left and right atria. Mild MR. Moderate MAC. Moderate aortic valve calcification/thickening. Moderate aortic valve stenosis. Mild dilatation of ascending aorta 40 mm.  Echo 11/19/2020: EF 50-55%. Grade II DD. Trivial MR. Moderate aortic valve calcification with moderate stenosis. Trivial AR. Mild dilatation ascending aorta 39 mm.  Echo 03/24/2021: EF 50-55%. Mild LVH. Moderate MAC. AV thickened and calcified with restricted motion. Aortic root is normal in  size.  Echo 02/12/2022: EF 60-65%. Moderate concentric LVH. Elevated left atrial pressure. Moderately dilated left atrium. Mildly dilated right atrium. Mild MR. Severe MAC. Mild AR. Severe aortic valve stenosis. Borderline dilatation of ascending aorta 39 mm.  Aortic stenosis. Coronary CT (TAVR) 08/22/2021: Tricuspid aortic vavle with severely reduced cusp excursion. Severely thickened and calcified aortic valve cusps. Aortic valve calcium score 3005.  Dilatation of aschending aorta.  Echo 07/30/2020: Mild dilatation of ascending aorta 40 mm.  Echo 11/19/2020: Mild dilatation of ascending aorta 39 mm.  Echo 02/12/2022: Borderline dilatation of ascending aorta 39 mm.   PVD/PAD. S/p left BKA 09/28/2019. ABI 08/02/2020: Moderate right lower extremity arterial disease.   Abdominal aortogram lower extremity with peripheral vascular intervention 08/05/2020: Right common iliac artery angioplasty with stent placement.  Vascular US IVC/iliac bilateral 08/14/2020: Acute DVT right external iliac vein at the right groin.   ABI 11/10/2020: Mild right lower extremity arterial disease.  Vascular US lower extremity 11/10/2020: Patent bypass. ? Hematoma.  Abdominal aortogram 11/13/2020: Patent right common iliac artery stent without stenosis. Severe disease bleow the common femoral artery. Superficial femoral artery reconstitutes with collaterals and small islands through its length.  Peripheral vascular intervention 11/14/2020: Endovascular bypass with angioplasty and stent placement of right SFA to below knee popliteal bypass.  Vascular US LE/ABI 01/14/2021: Patent right femoral-BK popliteal bypass graft without stenosis. Stents patent. ABI within normal limits.  Vascular US Aorta/IVC/iliacs 05/01/2021: Abdominal arteries appear patent with no evidence of significant stenosis. Right common iliac artery stent walls were  not able to be visualized but appear patent.  Vascular US LE 05/01/2021: Patent right femoral-BK popliteal bypass  graft with patent stents.  Abdominal aortogram 05/12/2021: Stent of right SFA.  Vascular US LE 06/22/2021: Patent right SFA stent. Vascular US LE 08/21/2021: Patent right stents in distal CFA and SFA.  Hypertension.  Hyperlipidemia.  Lipid panel 05/13/2021: LDL 48, HDL 49, TG 132, total 104.  T2DM.  CKD, stage III.  COPD.   Rodney [name] was first evaluated by cardiology for new onset systolic heart failure in June 2021 during hospital admission for foot infection.  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 Patient underwent left BKA on 09/28/2019. Echo revealed EF 35-40% and moderate aortic valve stenosis. Patient then developed afib with RVR post operatively. LHC showed 100% chronically occluded proximal RCA. Patient has an extensive vascular history with multiple interventions of his right lower extremity including amputations of great and second toes. He also progressed to severe aortic stenosis and underwent evaluation for TAVR for which he needed to have a dental appointment with probable extractions prior to proceeding with TAVR.   Patient's most recent hospitalization (6th for the year 2023) was 02/12/2022 to 02/17/2022 for respiratory failure requiring intubation. His UDS was positive for cocaine and THC. Patient was extubated and transitioned to BiPAP on 02/13/2022. Patient declined PT/OT. He was discharged home on 02/17/2022. Patient has a history of canceled and no-showed office visits. It does not appear he has been seen for an office visit since March 2022. No recent coumadin clinic visits.   Today, patient ***  CAD. LHC June 2021 showed chronically occluded RCA. LHC May 2023 showed nonobstructive CAD in LAD, ostial to proximal RCA, and LCx. Patient reports ***  Chronic systolic heart failure. Echo October 2023 EF 60-65%. Severe MAC, severe aortic stenosis. Patient reports *** Euvolemic and well compensated on  exam.*** Aortic stenosis. Echo October 2023 showed severe aortic stenosis. Patient underwent evaluation for TAVR complicated for need for second toe amputation (May 2023) and dental exam for potential multiple teeth extractions. During hospitalization in October 2023 wife shared they were dealing with financial barriers. Patient reports *** PAD/PVD. S/p left BKA June 2021. Extensive history with multiple interventions as outlined in HPI. Patient reports *** PAF. Patient went into Afib with RVR postoperatively in June 2021. He is on coumadin. He has not been to the coumadin clinic since *** Patient reports *** Hypertension. BP today *** Patient *** Hyperlipidemia. LDL January 2021 49, at goal.       Home Medications    No outpatient medications have been marked as taking for the 04/28/22 encounter (Appointment) with Almyra Deforest, [place].    Family History    Family History  Problem Relation Age of Onset   Alcoholism Mother    Alcoholism Father    He indicated that his mother is deceased. He indicated that his father is deceased.   Social History    Social History   Socioeconomic History   Marital status: Widowed    Spouse name: Not on file   Number of children: Not on file   Years of education: Not on file   Highest education level: Not on file  Occupational History   Not on file  Tobacco Use   Smoking status: Former    Packs/day: 0.50    Years: 50.00    Total pack years: 25.00    Types: Cigarettes    Start  date: 04/09/2021    Quit date: 07/24/2021    Years since quitting: 0.7   Smokeless tobacco: Never  Vaping Use   Vaping Use: Never used  Substance and Sexual Activity   Alcohol use: Yes    Alcohol/week: 6.0 standard drinks of alcohol    Types: 6 Standard drinks or equivalent per week    Comment: 11/07/20 - states he has not drank in 6 months   Drug use: Yes    Types: Cocaine, Marijuana   Sexual activity: Yes    Partners: Female    Comment: monagamous stable  relationship  Other Topics Concern   Not on file  Social History Narrative   Not on file   Social Determinants of Health   Financial Resource Strain: Not on file  Food Insecurity: No Food Insecurity (12/18/2021)   Hunger Vital Sign    Worried About Running Out of Food in the Last Year: Never true    Ran Out of Food in the Last Year: Never true  Transportation Needs: Unmet Transportation Needs (08/14/2021)   PRAPARE - Hydrologist (Medical): Yes    Lack of Transportation (Non-Medical): No  Physical Activity: Not on file  Stress: Not on file  Social Connections: Not on file  Intimate Partner Violence: Not on file     Review of Systems    General: *** No chills, fever, night sweats or weight changes.  Cardiovascular:  No chest pain, dyspnea on exertion, edema, orthopnea, palpitations, paroxysmal nocturnal dyspnea. Dermatological: No rash, lesions/masses Respiratory: No cough, dyspnea Urologic: No hematuria, dysuria Abdominal:   No nausea, vomiting, diarrhea, bright red blood per rectum, melena, or hematemesis Neurologic:  No visual changes, weakness, changes in mental status. All other systems reviewed and are otherwise negative except as noted above.  Physical Exam    VS:  There were no vitals taken for this visit. , BMI There is no height or weight on file to calculate BMI. GEN: *** Well nourished, well developed, in no acute distress. HEENT: Normal. Neck: Supple, no JVD, carotid bruits, or masses. Cardiac: RRR, no murmurs, rubs, or gallops. No clubbing, cyanosis, edema.  Radials/DP/PT 2+ and equal bilaterally.  Respiratory:  Respirations regular and unlabored, clear to auscultation bilaterally. GI: Soft, nontender, nondistended. MS: No deformity or atrophy. Skin: Warm and dry, no rash. Neuro: Strength and sensation are intact. Psych: Normal affect.  Accessory Clinical Findings    The following studies were reviewed for this visit: Abdominal  aortogram 08/05/2020: Findings:    Aortogram showed a calcified but patent infrarenal aorta with patent renal arteries bilaterally.  On the left he had a 50% common iliac calcified stenosis.  On the right he had a high-grade greater than 80% calcified common iliac stenosis that appeared flow limiting.  The right external iliac artery was patent.   Right lower extremity runoff showed a patent common femoral and profunda with a diffusely diseased SFA with multiple subtotal occlusions from calcification.  He has an occluded above-knee popliteal artery and ultimately reconstituted a below-knee popliteal artery with a patent trifurcation.  The below knee popliteal artery and TP trunk does look mild to moderately diseased.  Dominant runoff appeared to be through the anterior tibial and posterior tibial artery given the peroneal appeared to have a mid calf occlusion.  The anterior tibial did occlude above the ankle with dominant runoff into the foot through the PT.   The right common iliac artery high-grade stenosis was stented with an 8  mm x 29 mm VBX.  He will need evaluation for right femoropopliteal bypass  Recent Labs: 06/21/2021: TSH 3.936 02/12/2022: ALT 11 02/14/2022: B Natriuretic Peptide 1,439.0 02/15/2022: Magnesium 1.7 02/16/2022: BUN 41; Creatinine, Ser 1.34; Potassium 3.8; Sodium 135 02/17/2022: Hemoglobin 10.6; Platelets 244   Recent Lipid Panel    Component Value Date/Time   CHOL 104 05/13/2021 0143   CHOL 140 02/21/2018 1650   TRIG 132 02/13/2022 0510   HDL 49 05/13/2021 0143   HDL 33 (L) 02/21/2018 1650   CHOLHDL 2.1 05/13/2021 0143   VLDL 9 05/13/2021 0143   LDLCALC 46 05/13/2021 0143   LDLCALC 79 02/21/2018 1650   LDLDIRECT 92 01/03/2014 1514    No BP recorded.  {Refresh Note OR Click here to enter BP  :1}***    ECG personally reviewed by me today: ***  No significant changes from ***  {Does this patient have ATRIAL FIBRILLATION?:[phone]}   Assessment & Plan    ***      Disposition: ***   Justice Britain. Amarionna Arca, DNP, NP-C     04/26/2022, 3:48 PM [place] [address] Office [phone] Fax [phone]

## 2022-04-28 ENCOUNTER — Ambulatory Visit: Payer: Medicare PPO | Attending: Physician Assistant | Admitting: Physician Assistant

## 2022-04-30 ENCOUNTER — Ambulatory Visit: Payer: Medicare PPO | Admitting: Student

## 2022-05-07 ENCOUNTER — Ambulatory Visit: Payer: Medicare PPO | Admitting: Family Medicine

## 2022-05-19 DIAGNOSIS — I5033 Acute on chronic diastolic (congestive) heart failure: Secondary | ICD-10-CM | POA: Diagnosis not present

## 2022-05-20 ENCOUNTER — Encounter: Payer: Self-pay | Admitting: Family Medicine

## 2022-05-20 ENCOUNTER — Ambulatory Visit (INDEPENDENT_AMBULATORY_CARE_PROVIDER_SITE_OTHER): Payer: Medicare PPO | Admitting: Family Medicine

## 2022-05-20 VITALS — BP 98/56 | HR 121

## 2022-05-20 DIAGNOSIS — E1169 Type 2 diabetes mellitus with other specified complication: Secondary | ICD-10-CM

## 2022-05-20 DIAGNOSIS — N1831 Chronic kidney disease, stage 3a: Secondary | ICD-10-CM

## 2022-05-20 DIAGNOSIS — E785 Hyperlipidemia, unspecified: Secondary | ICD-10-CM

## 2022-05-20 DIAGNOSIS — Z794 Long term (current) use of insulin: Secondary | ICD-10-CM

## 2022-05-20 DIAGNOSIS — H938X1 Other specified disorders of right ear: Secondary | ICD-10-CM | POA: Diagnosis not present

## 2022-05-20 DIAGNOSIS — I2510 Atherosclerotic heart disease of native coronary artery without angina pectoris: Secondary | ICD-10-CM | POA: Diagnosis not present

## 2022-05-20 DIAGNOSIS — D649 Anemia, unspecified: Secondary | ICD-10-CM | POA: Diagnosis not present

## 2022-05-20 DIAGNOSIS — I480 Paroxysmal atrial fibrillation: Secondary | ICD-10-CM

## 2022-05-20 DIAGNOSIS — D638 Anemia in other chronic diseases classified elsewhere: Secondary | ICD-10-CM

## 2022-05-20 DIAGNOSIS — E1122 Type 2 diabetes mellitus with diabetic chronic kidney disease: Secondary | ICD-10-CM | POA: Diagnosis not present

## 2022-05-20 DIAGNOSIS — I350 Nonrheumatic aortic (valve) stenosis: Secondary | ICD-10-CM | POA: Diagnosis not present

## 2022-05-20 DIAGNOSIS — Z86718 Personal history of other venous thrombosis and embolism: Secondary | ICD-10-CM

## 2022-05-20 DIAGNOSIS — E1149 Type 2 diabetes mellitus with other diabetic neurological complication: Secondary | ICD-10-CM

## 2022-05-20 DIAGNOSIS — I5020 Unspecified systolic (congestive) heart failure: Secondary | ICD-10-CM

## 2022-05-20 DIAGNOSIS — N183 Chronic kidney disease, stage 3 unspecified: Secondary | ICD-10-CM

## 2022-05-20 LAB — POCT GLYCOSYLATED HEMOGLOBIN (HGB A1C): HbA1c, POC (controlled diabetic range): 6.5 % (ref 0.0–7.0)

## 2022-05-20 LAB — POCT INR: INR: 3 (ref 2.0–3.0)

## 2022-05-20 MED ORDER — CLOPIDOGREL BISULFATE 75 MG PO TABS: 75.0000 mg | ORAL_TABLET | Freq: Every day | ORAL | 3 refills | Status: DC

## 2022-05-20 MED ORDER — METFORMIN HCL 1000 MG PO TABS: 1000.0000 mg | ORAL_TABLET | Freq: Every day | ORAL | 3 refills | Status: DC

## 2022-05-20 MED ORDER — PANTOPRAZOLE SODIUM 40 MG PO TBEC: 40.0000 mg | DELAYED_RELEASE_TABLET | Freq: Two times a day (BID) | ORAL | 3 refills | Status: DC

## 2022-05-20 MED ORDER — FLUTICASONE PROPIONATE 50 MCG/ACT NA SUSP: 2.0000 | Freq: Every day | NASAL | 6 refills | Status: DC

## 2022-05-20 MED ORDER — ATORVASTATIN CALCIUM 80 MG PO TABS: 80.0000 mg | ORAL_TABLET | Freq: Every day | ORAL | 3 refills | Status: DC

## 2022-05-20 MED ORDER — FUROSEMIDE 40 MG PO TABS: 40.0000 mg | ORAL_TABLET | Freq: Every day | ORAL | 3 refills | Status: DC

## 2022-05-20 NOTE — Assessment & Plan Note (Signed)
Recheck hgb

## 2022-05-20 NOTE — Assessment & Plan Note (Signed)
Seems to be in a fib now.  Rate controled at rest.  BP low off meds.  I hesitate to add a beta blocker with current bp.

## 2022-05-20 NOTE — Assessment & Plan Note (Signed)
Good control on current meds. 

## 2022-05-20 NOTE — Assessment & Plan Note (Signed)
On coumadin 

## 2022-05-20 NOTE — Assessment & Plan Note (Signed)
On plavix

## 2022-05-20 NOTE — Progress Notes (Signed)
    SUBJECTIVE:   CHIEF COMPLAINT / HPI:   Multiple problems: Overview: Rodney [name] has beaten the odds and is alive.He has a terrible burden of chronic health problems.  With fortitude and with amazing support from his wife, Rodney [name], he has made it through a tough stretch.  Issues: DM. Great control on current meds.  A1C today =6.5 Chronic A fib, on warfarin and INR is therapeutic.  No bleeding.  No palpitations or lightheadedness. Severe Aortic stenosis.  No syncope or chest pain.  Does have dyspnea on minimal exertion.  General weakness. High risk social situation: Cared for by [name] who has health problems of her own.  Spends most of the time in bed.  Needs wheelchair and portable O2 to get out. HFrEF.  No swelling or orthopnea.  Does have DOE.    OBJECTIVE:   BP (!) 98/56   Pulse (!) 121   SpO2 98%   HR =90 when I examined.  Color good.  No resp distress at rest with O2 Lungs clear Cardiac irreg, irreg when harsh systolic m Abd benign Ext, no skin breakdown.    ASSESSMENT/PLAN:   Severe aortic stenosis Poor candidate for valve replacement.  Seems to be medically optimized at this point.    PAF (paroxysmal atrial fibrillation) (HCC) Seems to be in a fib now.  Rate controled at rest.  BP low off meds.  I hesitate to add a beta blocker with current bp.  History of DVT (deep vein thrombosis) On coumadin.  CAD (coronary artery disease) On plavix.    Anemia of chronic disease Recheck hgb  CKD stage 3 due to type 2 diabetes mellitus ([place]) Recheck BMP  DM (diabetes mellitus), type 2 with neurological complications (HCC) Good control on current meds  HFrEF (heart failure with reduced ejection fraction) ([place]) Good control on current meds.     Rodney Resides, MD [place]

## 2022-05-20 NOTE — Assessment & Plan Note (Addendum)
Poor candidate for valve replacement.  Seems to be medically optimized at this point.

## 2022-05-20 NOTE — Assessment & Plan Note (Signed)
Recheck BMP.

## 2022-05-20 NOTE — Patient Instructions (Addendum)
I will call with lab test results tomorrow.    Diabetes and blood thinner tests are great. I will miss you.  You are a good man and I love you.

## 2022-06-02 ENCOUNTER — Encounter: Payer: Self-pay | Admitting: Family Medicine

## 2022-06-08 NOTE — Patient Instructions (Signed)
Health Maintenance, Male Adopting a healthy lifestyle and getting preventive care are important in promoting health and wellness. Ask your health care provider about: The right schedule for you to have regular tests and exams. Things you can do on your own to prevent diseases and keep yourself healthy. What should I know about diet, weight, and exercise? Eat a healthy diet  Eat a diet that includes plenty of vegetables, fruits, low-fat dairy products, and lean protein. Do not eat a lot of foods that are high in solid fats, added sugars, or sodium. Maintain a healthy weight Body mass index (BMI) is a measurement that can be used to identify possible weight problems. It estimates body fat based on height and weight. Your health care provider can help determine your BMI and help you achieve or maintain a healthy weight. Get regular exercise Get regular exercise. This is one of the most important things you can do for your health. Most adults should: Exercise for at least 150 minutes each week. The exercise should increase your heart rate and make you sweat (moderate-intensity exercise). Do strengthening exercises at least twice a week. This is in addition to the moderate-intensity exercise. Spend less time sitting. Even light physical activity can be beneficial. Watch cholesterol and blood lipids Have your blood tested for lipids and cholesterol at 71 years of age, then have this test every 5 years. You may need to have your cholesterol levels checked more often if: Your lipid or cholesterol levels are high. You are older than 71 years of age. You are at high risk for heart disease. What should I know about cancer screening? Many types of cancers can be detected early and may often be prevented. Depending on your health history and family history, you may need to have cancer screening at various ages. This may include screening for: Colorectal cancer. Prostate cancer. Skin cancer. Lung  cancer. What should I know about heart disease, diabetes, and high blood pressure? Blood pressure and heart disease High blood pressure causes heart disease and increases the risk of stroke. This is more likely to develop in people who have high blood pressure readings or are overweight. Talk with your health care provider about your target blood pressure readings. Have your blood pressure checked: Every 3-5 years if you are 18-39 years of age. Every year if you are 40 years old or older. If you are between the ages of 65 and 75 and are a current or former smoker, ask your health care provider if you should have a one-time screening for abdominal aortic aneurysm (AAA). Diabetes Have regular diabetes screenings. This checks your fasting blood sugar level. Have the screening done: Once every three years after age 45 if you are at a normal weight and have a low risk for diabetes. More often and at a younger age if you are overweight or have a high risk for diabetes. What should I know about preventing infection? Hepatitis B If you have a higher risk for hepatitis B, you should be screened for this virus. Talk with your health care provider to find out if you are at risk for hepatitis B infection. Hepatitis C Blood testing is recommended for: Everyone born from 1945 through 1965. Anyone with known risk factors for hepatitis C. Sexually transmitted infections (STIs) You should be screened each year for STIs, including gonorrhea and chlamydia, if: You are sexually active and are younger than 71 years of age. You are older than 71 years of age and your   health care provider tells you that you are at risk for this type of infection. Your sexual activity has changed since you were last screened, and you are at increased risk for chlamydia or gonorrhea. Ask your health care provider if you are at risk. Ask your health care provider about whether you are at high risk for HIV. Your health care provider  may recommend a prescription medicine to help prevent HIV infection. If you choose to take medicine to prevent HIV, you should first get tested for HIV. You should then be tested every 3 months for as long as you are taking the medicine. Follow these instructions at home: Alcohol use Do not drink alcohol if your health care provider tells you not to drink. If you drink alcohol: Limit how much you have to 0-2 drinks a day. Know how much alcohol is in your drink. In the [place] one drink equals one 12 oz bottle of beer (355 mL), one 5 oz glass of wine (148 mL), or one 1 oz glass of hard liquor (44 mL). Lifestyle Do not use any products that contain nicotine or tobacco. These products include cigarettes, chewing tobacco, and vaping devices, such as e-cigarettes. If you need help quitting, ask your health care provider. Do not use street drugs. Do not share needles. Ask your health care provider for help if you need support or information about quitting drugs. General instructions Schedule regular health, dental, and eye exams. Stay current with your vaccines. Tell your health care provider if: You often feel depressed. You have ever been abused or do not feel safe at home. Summary Adopting a healthy lifestyle and getting preventive care are important in promoting health and wellness. Follow your health care provider's instructions about healthy diet, exercising, and getting tested or screened for diseases. Follow your health care provider's instructions on monitoring your cholesterol and blood pressure. This information is not intended to replace advice given to you by your health care provider. Make sure you discuss any questions you have with your health care provider. Document Revised: 09/02/2020 Document Reviewed: 09/02/2020 Elsevier Patient Education  2023 [employer]  

## 2022-06-08 NOTE — Progress Notes (Unsigned)
I connected with  Rodney [name] on 06/09/2022 by a audio enabled telemedicine application and verified that I am speaking with the correct person using two identifiers.  Patient Location: Home  Provider Location: Office/Clinic  I discussed the limitations of evaluation and management by telemedicine. The patient expressed understanding and agreed to proceed.  Subjective:   Rodney [name] is a 72 y.o. male who presents for an Initial Medicare Annual Wellness Visit.  Review of Systems    Per HPI unless specifically indicated below.  Cardiac Risk Factors include: advanced age (>104mn, >>27women);male gender, Hypertension, CAD, and CHF.          Objective:       05/20/2022   10:49 AM 02/17/2022   11:15 AM 02/17/2022    4:00 AM  Vitals with BMI  Weight   197 lbs 1 oz  BMI   [id number] Systolic 98 [id number] Diastolic 56 75 86  Pulse [id number]    Today's Vitals   06/09/22 1143  PainSc: 0-No pain   There is no height or weight on file to calculate BMI.     08/19/2021    9:00 PM 08/19/2021    5:39 AM 07/20/2021    6:13 PM 07/20/2021    4:36 AM 06/19/2021    3:41 PM 05/28/2021    6:40 PM 05/19/2021   10:58 AM  Advanced Directives  Does Patient Have a Medical Advance Directive? No Yes No No No No No  Does patient want to make changes to medical advance directive? No - Patient declined No - Patient declined       Would patient like information on creating a medical advance directive?  Yes (ED - Information included in AVS) No - Patient declined No - Patient declined No - Patient declined No - Patient declined No - Patient declined    Current Medications (verified) Outpatient Encounter Medications as of 06/09/2022  Medication Sig   acetaminophen (TYLENOL) 500 MG tablet Take 1,000 mg by mouth every 6 (six) hours as needed for mild pain.   atorvastatin (LIPITOR) 80 MG tablet Take 1 tablet (80 mg total) by mouth daily.   clopidogrel (PLAVIX) 75 MG tablet Take 1 tablet (75 mg  total) by mouth daily.   fluticasone (FLONASE) 50 MCG/ACT nasal spray Place 2 sprays into both nostrils daily.   furosemide (LASIX) 40 MG tablet Take 1 tablet (40 mg total) by mouth daily.   gabapentin (NEURONTIN) 100 MG capsule Take 100 mg by mouth 2 (two) times daily.   glucose blood (RELION TRUE METRIX TEST STRIPS) test strip Use to test blood sugar three times per day.   insulin detemir (LEVEMIR) 100 UNIT/ML FlexPen Inject 15 Units into the skin daily.   ipratropium-albuterol (DUONEB) 0.5-2.5 (3) MG/3ML SOLN Take 3 mLs by nebulization every 4 (four) hours as needed.   metFORMIN (GLUCOPHAGE) 1000 MG tablet Take 1 tablet (1,000 mg total) by mouth daily.   Nutritional Supplements (NUTRI-DRINK PO) Take 1 Bottle by mouth as needed (nutrition). Lactose free nutrition   pantoprazole (PROTONIX) 40 MG tablet Take 1 tablet (40 mg total) by mouth 2 (two) times daily.   warfarin (COUMADIN) 5 MG tablet One pill (5 mg) even days and one and a half pills (7.5 mg) odd days.   warfarin (COUMADIN) 7.5 MG tablet Take 1 tablet (7.5 mg total) by mouth one time only at 4 PM.   No facility-administered encounter medications on file as of 06/09/2022.  Allergies (verified) Patient has no known allergies.   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   COPD exacerbation ([place]) 12/09/2020   Coronary artery disease    Demand ischemia 03/26/2021   Diabetes mellitus without complication (HCC)    HLD (hyperlipidemia)    Hypertension    Long term (current) use of anticoagulants 12/29/2019   Malnutrition of  moderate degree 05/09/2021   Peripheral arterial disease ([place])    Pneumonia due to COVID-19 virus 05/29/2021   Past Surgical History:  Procedure Laterality Date   ABDOMINAL AORTOGRAM W/LOWER EXTREMITY N/A 08/05/2020   Procedure: ABDOMINAL AORTOGRAM W/LOWER EXTREMITY;  Surgeon: Marty Heck, MD;  Location: [place] [facility];  Service: Cardiovascular;  Laterality: N/A;   ABDOMINAL AORTOGRAM W/LOWER EXTREMITY N/A 11/13/2020   Procedure: ABDOMINAL AORTOGRAM W/LOWER EXTREMITY;  Surgeon: Cherre Robins, MD;  Location: [place] [facility];  Service: Cardiovascular;  Laterality: N/A;   ABDOMINAL AORTOGRAM W/LOWER EXTREMITY N/A 05/12/2021   Procedure: ABDOMINAL AORTOGRAM W/LOWER EXTREMITY;  Surgeon: Waynetta Sandy, MD;  Location: [place] [facility];  Service: Cardiovascular;  Laterality: N/A;   AMPUTATION Left 09/28/2019   Procedure: AMPUTATION BELOW KNEE;  Surgeon: Newt Minion, MD;  Location: [place];  Service: Orthopedics;  Laterality: Left;   AMPUTATION Right 11/15/2020   Procedure: RIGHT GREAT TOE AMPUTATION;  Surgeon: Newt Minion, MD;  Location: [place];  Service: Orthopedics;  Laterality: Right;   AMPUTATION Right 08/26/2021   Procedure: AMPUTATION 2nd TOE;  Surgeon: Waynetta Sandy, MD;  Location: [place];  Service: Vascular;  Laterality: Right;   CARDIAC CATHETERIZATION     CARDIOVERSION N/A 10/05/2019   Procedure: CARDIOVERSION;  Surgeon: Sanda Klein, MD;  Location: [facility];  Service: Cardiovascular;  Laterality: N/A;   FEMORAL-POPLITEAL BYPASS GRAFT Right 08/07/2020   Procedure: RIGHT FEMORAL TO BELOW KNEE POPLITEAL ARTERY BYPASS;  Surgeon: Waynetta Sandy, MD;  Location: [place];  Service: Vascular;  Laterality: Right;   LEFT HEART CATH AND CORONARY ANGIOGRAPHY N/A 10/03/2019   Procedure: LEFT HEART CATH AND CORONARY ANGIOGRAPHY;  Surgeon: Lorretta Harp, MD;  Location: [place] [facility];  Service: Cardiovascular;  Laterality: N/A;   PERIPHERAL  VASCULAR INTERVENTION Right 08/05/2020   Procedure: PERIPHERAL VASCULAR INTERVENTION;  Surgeon: Marty Heck, MD;  Location: [place] [facility];  Service: Cardiovascular;  Laterality: Right;  common Iliac   PERIPHERAL VASCULAR INTERVENTION Left 11/13/2020   Procedure: PERIPHERAL VASCULAR INTERVENTION;  Surgeon: Cherre Robins, MD;  Location: [place] [facility];  Service: Cardiovascular;  Laterality: Left;   PERIPHERAL VASCULAR INTERVENTION Right 11/14/2020   Procedure: PERIPHERAL VASCULAR INTERVENTION;  Surgeon: Marty Heck, MD;  Location: [place] [facility] [facility];  Service: Cardiovascular;  Laterality: Right;  POP/SFA STENT   PERIPHERAL VASCULAR INTERVENTION  05/12/2021   Procedure: PERIPHERAL VASCULAR INTERVENTION;  Surgeon: Waynetta Sandy, MD;  Location: [place] [facility];  Service: Cardiovascular;;   RIGHT/LEFT HEART CATH AND CORONARY ANGIOGRAPHY N/A 08/25/2021   Procedure: RIGHT/LEFT HEART CATH AND CORONARY ANGIOGRAPHY;  Surgeon: Troy Sine,  MD;  Location: [place] [facility];  Service: Cardiovascular;  Laterality: N/A;   TEE WITHOUT CARDIOVERSION N/A 10/05/2019   Procedure: TRANSESOPHAGEAL ECHOCARDIOGRAM (TEE);  Surgeon: Sanda Klein, MD;  Location: [facility];  Service: Cardiovascular;  Laterality: N/A;   Family History  Problem Relation Age of Onset   Alcoholism Mother    Alcoholism Father    Social History   Socioeconomic History   Marital status: Significant Other    Spouse name: Not on file   Number of children: 2   Years of education: Not on file   Highest education level: Not on file  Occupational History   Occupation: Retired  Tobacco Use   Smoking status: Former    Packs/day: 0.50    Years: 50.00    Total pack years: 25.00    Types: Cigarettes    Start date: 04/09/2021    Quit date: 07/24/2021    Years since quitting: 0.8   Smokeless tobacco: Never  Vaping Use   Vaping Use: Never used  Substance and Sexual Activity   Alcohol use: Not  Currently    Comment: 11/07/20 - states he has not drank in 6 months   Drug use: Yes    Types: Cocaine, Marijuana   Sexual activity: Yes    Partners: Female    Comment: monagamous stable relationship  Other Topics Concern   Not on file  Social History Narrative   Not on file   Social Determinants of Health   Financial Resource Strain: Not on file  Food Insecurity: No Food Insecurity (06/09/2022)   Hunger Vital Sign    Worried About Running Out of Food in the Last Year: Never true    Ran Out of Food in the Last Year: Never true  Transportation Needs: Unmet Transportation Needs (06/09/2022)   PRAPARE - Hydrologist (Medical): Yes    Lack of Transportation (Non-Medical): No  Physical Activity: Inactive (06/09/2022)   Exercise Vital Sign    Days of Exercise per Week: 0 days    Minutes of Exercise per Session: 0 min  Stress: No Stress Concern Present (06/09/2022)   [place]    Feeling of Stress : Not at all  Social Connections: Socially Isolated (06/09/2022)   Social Connection and Isolation Panel [NHANES]    Frequency of Communication with Friends and Family: Once a week    Frequency of Social Gatherings with Friends and Family: Never    Attends Religious Services: Never    Marine scientist or Organizations: No    Attends Music therapist: Never    Marital Status: Living with partner    Tobacco Counseling Counseling given: Not Answered   Clinical Intake:  Pre-visit preparation completed: No  Pain : No/denies pain Pain Score: 0-No pain     Nutritional Status: BMI of 19-24  Normal Nutritional Risks: Nausea/ vomitting/ diarrhea Diabetes: Yes CBG done?: No Did pt. bring in CBG monitor from home?: No  How often do you need to have someone help you when you read instructions, pamphlets, or other written materials from your doctor or pharmacy?: 1 -  Never  Diabetic?Nutrition Risk Assessment:  Has the patient had any N/V/D within the last 2 months?  No  Does the patient have any non-healing wounds?  No  Has the patient had any unintentional weight loss or weight gain?  No   Diabetes:  Is the patient diabetic?  Yes  If  diabetic, was a CBG obtained today?  No  Did the patient bring in their glucometer from home?  No  How often do you monitor your CBG's? never.   Financial Strains and Diabetes Management:  Are you having any financial strains with the device, your supplies or your medication? No .  Does the patient want to be seen by Chronic Care Management for management of their diabetes?  No  Would the patient like to be referred to a Nutritionist or for Diabetic Management?  No   Diabetic Exams:  Diabetic Eye Exam: Overdue for diabetic eye exam. Pt has been advised about the importance in completing this exam. Patient advised to call and schedule an eye exam. Diabetic Foot Exam: Overdue, Pt has been advised about the importance in completing this exam. Pt is scheduled for diabetic foot exam on next.      Information entered by :: Donnie Mesa, CMA   Activities of Daily Living    06/09/2022   11:40 AM 09/03/2021    3:00 PM  In your present state of health, do you have any difficulty performing the following activities:  Hearing? 0 0  Vision? 0 0  Comment Last Eye Exam 2 yrs ago [facility]   Difficulty concentrating or making decisions? 0 0  Walking or climbing stairs? 1 1  Dressing or bathing? 0 1  Doing errands, shopping? 1     Patient Care Team: [name], MD as PCP - General O'Neal, Cassie Freer, MD as PCP - Cardiology (Cardiology) Lazaro Arms, RN as Case Manager  Indicate any recent Medical Services you may have received from other than [facility] providers in the past year (date may be approximate).     Assessment:   This is a routine wellness examination for Rodney [name].  Hearing/Vision  screen  Denies any hearing issues. Denies any change to her vision.  Dietary issues and exercise activities discussed: Current Exercise Habits: The patient does not participate in regular exercise at present, Exercise limited by: orthopedic condition(s);Other - see comments   Goals Addressed   None    Depression Screen    06/09/2022   11:40 AM 05/20/2022   10:52 AM 05/19/2021   11:15 AM 02/13/2021   10:29 AM 12/09/2020   11:37 AM 10/29/2020    3:21 PM 10/14/2020   11:44 AM  PHQ 2/9 Scores  PHQ - 2 Score 0 0 0 0 0 0 0  PHQ- 9 Score  0 0 0 0 0 0    Fall Risk    06/09/2022   11:40 AM 05/20/2022   10:51 AM 08/07/2021    1:53 PM 08/29/2020    2:11 PM 05/01/2020    3:45 PM  Fall Risk   Falls in the past year? 0 0 0 1 0  Number falls in past yr: 0   1 0  Injury with Fall? 0   0 0  Risk for fall due to : No Fall Risks      Follow up Falls evaluation completed        FALL RISK PREVENTION PERTAINING TO THE HOME:  Any stairs in or around the home? No  If so, are there any without handrails? No  Home free of loose throw rugs in walkways, pet beds, electrical cords, etc? Yes  Adequate lighting in your home to reduce risk of falls? Yes   ASSISTIVE DEVICES UTILIZED TO PREVENT FALLS:  Life alert? No  Use of a cane, walker or w/c? Yes  Grab bars in the bathroom? No  Shower chair or bench in shower? Yes  Elevated toilet seat or a handicapped toilet? Yes   TIMED UP AND GO:  Was the test performed? Unable to perform, virtual appointment   Cognitive Function:        06/09/2022   11:41 AM  6CIT Screen  What Year? 0 points  What month? 0 points  What time? 0 points  Count back from 20 0 points  Months in reverse 0 points  Repeat phrase 0 points  Total Score 0 points    Immunizations Immunization History  Administered Date(s) Administered   Fluad Quad(high Dose 65+) 05/01/2020, 02/13/2021   Influenza Split 05/08/2011   Influenza Whole 02/15/2009   Influenza,inj,Quad PF,6+  Mos 01/03/2014, 12/18/2015, 03/25/2017, 02/21/2018   PFIZER Comirnaty(Gray Top)Covid-19 Tri-Sucrose Vaccine 07/03/2020   PFIZER(Purple Top)SARS-COV-2 Vaccination 11/23/2019, 12/15/2019   Pfizer Covid-19 Vaccine Bivalent Booster 60yr & up 02/13/2021   Pneumococcal Conjugate-13 09/06/2013, 03/25/2017   Pneumococcal Polysaccharide-23 09/12/2014, 05/01/2020   Td 01/17/2009   Tdap 01/26/2021    TDAP status: Up to date  Flu Vaccine status: Due, Education has been provided regarding the importance of this vaccine. Advised may receive this vaccine at local pharmacy or Health Dept. Aware to provide a copy of the vaccination record if obtained from local pharmacy or Health Dept. Verbalized acceptance and understanding.  Pneumococcal vaccine status: Up to date  Covid-19 vaccine status: Information provided on how to obtain vaccines.   Qualifies for Shingles Vaccine? Yes   Zostavax completed No   Shingrix Completed?: No.    Education has been provided regarding the importance of this vaccine. Patient has been advised to call insurance company to determine out of pocket expense if they have not yet received this vaccine. Advised may also receive vaccine at local pharmacy or Health Dept. Verbalized acceptance and understanding.  Screening Tests Health Maintenance  Topic Date Due   OPHTHALMOLOGY EXAM  09/07/2014   FOOT EXAM  02/22/2019   Diabetic kidney evaluation - Urine ACR  04/03/2021   COVID-19 Vaccine (5 - 2023-24 season) 12/26/2021   INFLUENZA VACCINE  07/26/2022 (Originally 11/25/2021)   Zoster Vaccines- Shingrix (1 of 2) 09/07/2022 (Originally 02/10/1970)   COLONOSCOPY (Pts 45-441yrInsurance coverage will need to be confirmed)  03/28/2023 (Originally 05/25/2012)   Lung Cancer Screening  08/23/2022   HEMOGLOBIN A1C  11/18/2022   Diabetic kidney evaluation - eGFR measurement  02/17/2023   Medicare Annual Wellness (AWV)  06/10/2023   DTaP/Tdap/Td (3 - Td or Tdap) 01/27/2031   Pneumonia  Vaccine [age]Years old  Completed   Hepatitis C Screening  Completed   HPV VACCINES  Aged Out    Health Maintenance  Health Maintenance Due  Topic Date Due   OPHTHALMOLOGY EXAM  09/07/2014   FOOT EXAM  02/22/2019   Diabetic kidney evaluation - Urine ACR  04/03/2021   COVID-19 Vaccine (5 - 2023-24 season) 12/26/2021    Colorectal Cancer Screening: Declined scheduling at this time. Concern about his cardiac issues and the inability of not being able to be put to sleep.  Lung Cancer Screening: (Low Dose CT Chest recommended if Age 71-80ears, 30 pack-year currently smoking OR have quit w/in 15years.) does not qualify.   Lung Cancer Screening Referral: not applicable   Additional Screening:  Hepatitis C Screening: does qualify; Completed 04/04/2020  Vision Screening: Recommended annual ophthalmology exams for early detection of glaucoma and other disorders of the eye. Is the patient up to date  with their annual eye exam?  No Who is the provider or what is the name of the office in which the patient attends annual eye exams? [facility]  If pt is not established with a provider, would they like to be referred to a provider to establish care? No . Declined scheduling an appt, the patient will schedule on his own.  Dental Screening: Recommended annual dental exams for proper oral hygiene  Community Resource Referral / Chronic Care Management: CRR required this visit?  No   CCM required this visit?  No      Plan:     I have personally reviewed and noted the following in the patient's chart:   Medical and social history Use of alcohol, tobacco or illicit drugs  Current medications and supplements including opioid prescriptions. Patient is not currently taking opioid prescriptions. Functional ability and status Nutritional status Physical activity Advanced directives List of other physicians Hospitalizations, surgeries, and ER visits in previous 12  months Vitals Screenings to include cognitive, depression, and falls Referrals and appointments  In addition, I have reviewed and discussed with patient certain preventive protocols, quality metrics, and best practice recommendations. A written personalized care plan for preventive services as well as general preventive health recommendations were provided to patient.    Rodney [name] , Thank you for taking time to come for your Medicare Wellness Visit. I appreciate your ongoing commitment to your health goals. Please review the following plan we discussed and let me know if I can assist you in the future.   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 Patient has placed an application for differen housing and awaiting approval.  SW completed SDOH screening. Patient receives Food Stamps and meal delivery.        This is a list of the screening recommended for you and due dates:  Health Maintenance  Topic Date Due   Eye exam for diabetics  09/07/2014   Complete foot exam   02/22/2019   Yearly kidney health urinalysis for diabetes  04/03/2021   COVID-19 Vaccine (5 - 2023-24 season) 12/26/2021   Flu Shot  07/26/2022*   Zoster (Shingles) Vaccine (1 of 2) 09/07/2022*   Colon Cancer Screening  03/28/2023*   Screening for Lung Cancer  08/23/2022   Hemoglobin A1C  11/18/2022   Yearly kidney function blood test for diabetes  02/17/2023   Medicare Annual Wellness Visit  06/10/2023   DTaP/Tdap/Td vaccine (3 - Td or Tdap) 01/27/2031   Pneumonia Vaccine  Completed   Hepatitis C Screening: USPSTF Recommendation to screen - Ages 80-79 yo.  Completed   HPV Vaccine  Aged Out  *Topic was postponed. The date shown is not the original due date.     Wilson Singer, [place]   06/09/2022  Nurse Notes: Approximately 30 minute Non-Face -To-Face Medicare Wellness Visit

## 2022-06-09 ENCOUNTER — Ambulatory Visit (INDEPENDENT_AMBULATORY_CARE_PROVIDER_SITE_OTHER): Payer: Medicare PPO

## 2022-06-09 DIAGNOSIS — Z0000 Encounter for general adult medical examination without abnormal findings: Secondary | ICD-10-CM

## 2022-06-30 ENCOUNTER — Encounter (HOSPITAL_COMMUNITY): Payer: Medicare PPO

## 2022-06-30 ENCOUNTER — Other Ambulatory Visit (HOSPITAL_COMMUNITY): Payer: Medicare PPO

## 2022-07-02 ENCOUNTER — Ambulatory Visit (HOSPITAL_COMMUNITY): Admission: RE | Admit: 2022-07-02 | Payer: Medicare PPO | Source: Ambulatory Visit

## 2022-07-02 ENCOUNTER — Ambulatory Visit (HOSPITAL_COMMUNITY): Payer: Medicare PPO

## 2022-07-15 ENCOUNTER — Ambulatory Visit: Payer: Medicare PPO

## 2022-07-17 ENCOUNTER — Ambulatory Visit (HOSPITAL_COMMUNITY): Payer: Medicare PPO

## 2022-07-17 ENCOUNTER — Telehealth: Payer: Self-pay

## 2022-07-17 ENCOUNTER — Other Ambulatory Visit: Payer: Self-pay | Admitting: Family Medicine

## 2022-07-17 DIAGNOSIS — J9611 Chronic respiratory failure with hypoxia: Secondary | ICD-10-CM

## 2022-07-17 DIAGNOSIS — J439 Emphysema, unspecified: Secondary | ICD-10-CM

## 2022-07-17 DIAGNOSIS — I5032 Chronic diastolic (congestive) heart failure: Secondary | ICD-10-CM

## 2022-07-17 NOTE — Telephone Encounter (Signed)
Patient's wife calls nurse line requesting a smaller/portable oxygen tank for when patient needs to leave the house.   Will forward request to PCP.   Talbot Grumbling, RN

## 2022-07-17 NOTE — Progress Notes (Signed)
Patient's wife calls nurse line requesting a smaller/portable oxygen tank for when patient needs to leave the house.    Will forward request to PCP.    Talbot Grumbling, RN

## 2022-07-17 NOTE — Telephone Encounter (Signed)
DME ordered and sent to [employer] team.

## 2022-07-27 ENCOUNTER — Ambulatory Visit: Payer: Medicare PPO | Admitting: Pharmacist

## 2022-07-27 ENCOUNTER — Telehealth: Payer: Self-pay | Admitting: Pharmacist

## 2022-07-27 DIAGNOSIS — E1149 Type 2 diabetes mellitus with other diabetic neurological complication: Secondary | ICD-10-CM

## 2022-07-27 MED ORDER — INSULIN GLARGINE 100 UNIT/ML SOLOSTAR PEN: 12.0000 [IU] | PEN_INJECTOR | SUBCUTANEOUS | 2 refills | Status: DC

## 2022-07-27 NOTE — Telephone Encounter (Signed)
Attempted to contact patient in follow- up of visit scheduled earlier today to discuss transition from Levemir (insulin detemir) to Lantus ( insulin glargine).   [name] - answered the contact number and clarified that she takes care of all phone communications with patient.  She was able to verify current dose of Levemir at 15 units once daily and also current excess supply of 2 pens (40 day supply).   Discussed transition with [name], clarified the need for transition was future discontinuation of current tx.    Current A1c is 6.5 and control at home was reported as good.  Denies any low readings OR any symptoms of hypoglycemia.   Agreed to dose reduce when transitioning to new Lantus (insulin glargine) at 12 units once daily.    [name] verbalized understanding to use current supply of Levemir until completed and then transition to Lantus.   New prescription sent to pharmacy.

## 2022-07-27 NOTE — Assessment & Plan Note (Signed)
Attempted to contact patient in follow- up of visit scheduled earlier today to discuss transition from Levemir (insulin detemir) to Lantus ( insulin glargine).   [name] - answered the contact number and clarified that she takes care of all phone communications with patient.  She was able to verify current dose of Levemir at 15 units once daily and also current excess supply of 2 pens (40 day supply).   Discussed transition with [name], clarified the need for transition was future discontinuation of current tx.    Current A1c is 6.5 and control at home was reported as good.  Denies any low readings OR any symptoms of hypoglycemia.   Agreed to dose reduce when transitioning to new Lantus (insulin glargine) at 12 units once daily.    [name] verbalized understanding to use current supply of Levemir until completed and then transition to Lantus.   New prescription sent to pharmacy.

## 2022-07-28 NOTE — Telephone Encounter (Signed)
Reviewed and agree with Dr Koval's plan.   

## 2022-07-28 NOTE — Telephone Encounter (Signed)
Community message sent to [employer] for processing.  

## 2022-07-28 NOTE — Telephone Encounter (Signed)
[employer] confirmed order. 

## 2022-07-28 NOTE — Telephone Encounter (Signed)
DME order placed. Route to RN clinic to process.

## 2022-07-30 ENCOUNTER — Ambulatory Visit: Payer: Medicare PPO | Admitting: Pharmacist

## 2022-08-04 ENCOUNTER — Telehealth: Payer: Self-pay | Admitting: Family Medicine

## 2022-08-04 ENCOUNTER — Telehealth: Payer: Self-pay | Admitting: Cardiovascular Disease

## 2022-08-04 ENCOUNTER — Other Ambulatory Visit: Payer: Self-pay

## 2022-08-04 DIAGNOSIS — J449 Chronic obstructive pulmonary disease, unspecified: Secondary | ICD-10-CM

## 2022-08-04 DIAGNOSIS — I5022 Chronic systolic (congestive) heart failure: Secondary | ICD-10-CM

## 2022-08-04 DIAGNOSIS — J9611 Chronic respiratory failure with hypoxia: Secondary | ICD-10-CM

## 2022-08-04 MED ORDER — WARFARIN SODIUM 7.5 MG PO TABS: ORAL_TABLET | ORAL | 0 refills | Status: DC

## 2022-08-04 NOTE — Telephone Encounter (Signed)
Left message for patient to return the call.

## 2022-08-04 NOTE — Progress Notes (Deleted)
HISTORY AND PHYSICAL     CC:  follow up. Requesting Provider:  Doreene Eland, MD  HPI: This is a 72 y.o. male who is here today for follow up for PAD.  Pt has hx of CLI with RLE tissue loss and underwent angiogram with right CIA angioplasty and stent placement 08/05/2020 by Dr. Chestine Spore.  He subsequently underwent right SFA to BK popliteal artery bypass with non reversed GSV on 08/07/2021 by Dr. Randie Heinz.  In July 2022, he was found to have CLI with occluded bypass and underwent angiogram with stent placement in the right SFA to BK popliteal bypass by Dr. Chestine Spore.  In January 2023, he was found to have chronic RLE  limb threatening ischemia with toe ulceration and underwent angiogram with stent of the right SFA by Dr. Randie Heinz.  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 Randie Heinz.   Pt was last seen 10/14/2021 and at that time, his toe amputation had healed.  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  He was scheduled for 6 month follow up.    The pt returns today for follow up.  ***  The pt is on a statin for cholesterol management.    The pt is not on an aspirin.    Other AC:  Plavix/coumadin The pt is on diuretic for hypertension.  The pt is  on medication for diabetes. Tobacco hx:  former  Pt does *** have family hx of AAA.  Past Medical History:  Diagnosis Date   Acute combined systolic and diastolic congestive heart failure (HCC)    Acute on chronic heart failure with preserved ejection fraction (HFpEF) (HCC)    Acute respiratory failure with hypoxia (HCC)    Acute respiratory failure with hypoxia (HCC)    Aortic stenosis    moderate in 2022   Atrial fibrillation (HCC)    CHF (congestive heart failure) (HCC)    Chronic pleural effusion, Left 11/16/2020   COPD exacerbation (HCC) 12/09/2020   Coronary artery disease    Demand ischemia 03/26/2021   Diabetes mellitus without  complication (HCC)    HLD (hyperlipidemia)    Hypertension    Long term (current) use of anticoagulants 12/29/2019   Malnutrition of moderate degree 05/09/2021   Peripheral arterial disease (HCC)    Pneumonia due to COVID-19 virus 05/29/2021    Past Surgical History:  Procedure Laterality Date   ABDOMINAL AORTOGRAM W/LOWER EXTREMITY N/A 08/05/2020   Procedure: ABDOMINAL AORTOGRAM W/LOWER EXTREMITY;  Surgeon: Cephus Shelling, MD;  Location: [facility];  Service: Cardiovascular;  Laterality: N/A;   ABDOMINAL AORTOGRAM W/LOWER EXTREMITY N/A 11/13/2020   Procedure: ABDOMINAL AORTOGRAM W/LOWER EXTREMITY;  Surgeon: Leonie Douglas, MD;  Location: [facility];  Service: Cardiovascular;  Laterality: N/A;   ABDOMINAL AORTOGRAM W/LOWER EXTREMITY N/A 05/12/2021   Procedure: ABDOMINAL AORTOGRAM W/LOWER EXTREMITY;  Surgeon: Maeola Harman, MD;  Location: [facility] INVASIVE CV LAB;  Service: Cardiovascular;  Laterality: N/A;   AMPUTATION Left 09/28/2019   Procedure: AMPUTATION BELOW KNEE;  Surgeon: Nadara Mustard, MD;  Location: [facility] OR;  Service: Orthopedics;  Laterality: Left;   AMPUTATION Right 11/15/2020   Procedure: RIGHT GREAT TOE AMPUTATION;  Surgeon: Nadara Mustard, MD;  Location: [facility] OR;  Service: Orthopedics;  Laterality: Right;   AMPUTATION Right 08/26/2021   Procedure: AMPUTATION 2nd TOE;  Surgeon:  Maeola Harman, MD;  Location: [facility] OR;  Service: Vascular;  Laterality: Right;   CARDIAC CATHETERIZATION     CARDIOVERSION N/A 10/05/2019   Procedure: CARDIOVERSION;  Surgeon: Thurmon Fair, MD;  Location: [facility];  Service: Cardiovascular;  Laterality: N/A;   FEMORAL-POPLITEAL BYPASS GRAFT Right 08/07/2020   Procedure: RIGHT FEMORAL TO BELOW KNEE POPLITEAL ARTERY BYPASS;  Surgeon: Maeola Harman, MD;  Location: [facility] OR;  Service: Vascular;  Laterality: Right;   LEFT HEART CATH AND CORONARY ANGIOGRAPHY N/A 10/03/2019   Procedure: LEFT HEART CATH AND CORONARY  ANGIOGRAPHY;  Surgeon: Runell Gess, MD;  Location: [facility];  Service: Cardiovascular;  Laterality: N/A;   PERIPHERAL VASCULAR INTERVENTION Right 08/05/2020   Procedure: PERIPHERAL VASCULAR INTERVENTION;  Surgeon: Cephus Shelling, MD;  Location: [facility];  Service: Cardiovascular;  Laterality: Right;  common Iliac   PERIPHERAL VASCULAR INTERVENTION Left 11/13/2020   Procedure: PERIPHERAL VASCULAR INTERVENTION;  Surgeon: Leonie Douglas, MD;  Location: [facility];  Service: Cardiovascular;  Laterality: Left;   PERIPHERAL VASCULAR INTERVENTION Right 11/14/2020   Procedure: PERIPHERAL VASCULAR INTERVENTION;  Surgeon: Cephus Shelling, MD;  Location: [facility];  Service: Cardiovascular;  Laterality: Right;  POP/SFA STENT   PERIPHERAL VASCULAR INTERVENTION  05/12/2021   Procedure: PERIPHERAL VASCULAR INTERVENTION;  Surgeon: Maeola Harman, MD;  Location: [facility] INVASIVE CV LAB;  Service: Cardiovascular;;   RIGHT/LEFT HEART CATH AND CORONARY ANGIOGRAPHY N/A 08/25/2021   Procedure: RIGHT/LEFT HEART CATH AND CORONARY ANGIOGRAPHY;  Surgeon: Lennette Bihari, MD;  Location: [facility];  Service: Cardiovascular;  Laterality: N/A;   TEE WITHOUT CARDIOVERSION N/A 10/05/2019   Procedure: TRANSESOPHAGEAL ECHOCARDIOGRAM (TEE);  Surgeon: Thurmon Fair, MD;  Location: [facility];  Service: Cardiovascular;  Laterality: N/A;    No Known Allergies  Current Outpatient Medications  Medication Sig Dispense Refill   acetaminophen (TYLENOL) 500 MG tablet Take 1,000 mg by mouth every 6 (six) hours as needed for mild pain.     atorvastatin (LIPITOR) 80 MG tablet Take 1 tablet (80 mg total) by mouth daily. 90 tablet 3   clopidogrel (PLAVIX) 75 MG tablet Take 1 tablet (75 mg total) by mouth daily. 90 tablet 3   fluticasone (FLONASE) 50 MCG/ACT nasal spray Place 2 sprays into both nostrils daily. 16 g 6   furosemide (LASIX) 40 MG tablet Take 1 tablet (40 mg  total) by mouth daily. 90 tablet 3   gabapentin (NEURONTIN) 100 MG capsule Take 100 mg by mouth 2 (two) times daily.     glucose blood (RELION TRUE METRIX TEST STRIPS) test strip Use to test blood sugar three times per day. 300 each 3   insulin detemir (LEVEMIR) 100 UNIT/ML FlexPen Inject 15 Units into the skin daily. 15 mL 11   insulin glargine (LANTUS) 100 UNIT/ML Solostar Pen Inject 12 Units into the skin every morning. 15 mL 2   ipratropium-albuterol (DUONEB) 0.5-2.5 (3) MG/3ML SOLN Take 3 mLs by nebulization every 4 (four) hours as needed. 300 mL 3   metFORMIN (GLUCOPHAGE) 1000 MG tablet Take 1 tablet (1,000 mg total) by mouth daily. 90 tablet 3   Nutritional Supplements (NUTRI-DRINK PO) Take 1 Bottle by mouth as needed (nutrition). Lactose free nutrition     pantoprazole (PROTONIX) 40 MG tablet Take 1 tablet (40 mg total) by mouth 2 (two) times daily. 180 tablet 3   warfarin (COUMADIN) 5 MG tablet One pill (5 mg) even days and one and a  half pills (7.5 mg) odd days. 45 tablet 3   warfarin (COUMADIN) 7.5 MG tablet Take 1 tablet (7.5 mg total) by mouth one time only at 4 PM. 30 tablet 0   No current facility-administered medications for this visit.    Family History  Problem Relation Age of Onset   Alcoholism Mother    Alcoholism Father     Social History   Socioeconomic History   Marital status: Significant Other    Spouse name: Not on file   Number of children: 2   Years of education: Not on file   Highest education level: Not on file  Occupational History   Occupation: Retired  Tobacco Use   Smoking status: Former    Packs/day: 0.50    Years: 50.00    Additional pack years: 0.00    Total pack years: 25.00    Types: Cigarettes    Start date: 04/09/2021    Quit date: 07/24/2021    Years since quitting: 1.0   Smokeless tobacco: Never  Vaping Use   Vaping Use: Never used  Substance and Sexual Activity   Alcohol use: Not Currently    Comment: 11/07/20 - states he has not  drank in 6 months   Drug use: Yes    Types: Cocaine, Marijuana   Sexual activity: Yes    Partners: Female    Comment: monagamous stable relationship  Other Topics Concern   Not on file  Social History Narrative   Not on file   Social Determinants of Health   Financial Resource Strain: Not on file  Food Insecurity: No Food Insecurity (06/09/2022)   Hunger Vital Sign    Worried About Running Out of Food in the Last Year: Never true    Ran Out of Food in the Last Year: Never true  Transportation Needs: Unmet Transportation Needs (06/09/2022)   PRAPARE - Administrator, Civil Service (Medical): Yes    Lack of Transportation (Non-Medical): No  Physical Activity: Inactive (06/09/2022)   Exercise Vital Sign    Days of Exercise per Week: 0 days    Minutes of Exercise per Session: 0 min  Stress: No Stress Concern Present (06/09/2022)   [employer] of Occupational Health - Occupational Stress Questionnaire    Feeling of Stress : Not at all  Social Connections: Socially Isolated (06/09/2022)   Social Connection and Isolation Panel [NHANES]    Frequency of Communication with Friends and Family: Once a week    Frequency of Social Gatherings with Friends and Family: Never    Attends Religious Services: Never    Database administrator or Organizations: No    Attends Banker Meetings: Never    Marital Status: Living with partner  Intimate Partner Violence: Not At Risk (06/09/2022)   Humiliation, Afraid, Rape, and Kick questionnaire    Fear of Current or Ex-Partner: No    Emotionally Abused: No    Physically Abused: No    Sexually Abused: No     REVIEW OF SYSTEMS:  *** [X]  denotes positive finding, [ ]  denotes negative finding Cardiac  Comments:  Chest pain or chest pressure:    Shortness of breath upon exertion:    Short of breath when lying flat:    Irregular heart rhythm:        Vascular    Pain in calf, thigh, or hip brought on by ambulation:     Pain in feet at night that wakes you up from your sleep:  Blood clot in your veins:    Leg swelling:         Pulmonary    Oxygen at home:    Productive cough:     Wheezing:         Neurologic    Sudden weakness in arms or legs:     Sudden numbness in arms or legs:     Sudden onset of difficulty speaking or slurred speech:    Temporary loss of vision in one eye:     Problems with dizziness:         Gastrointestinal    Blood in stool:     Vomited blood:         Genitourinary    Burning when urinating:     Blood in urine:        Psychiatric    Major depression:         Hematologic    Bleeding problems:    Problems with blood clotting too easily:        Skin    Rashes or ulcers:        Constitutional    Fever or chills:      PHYSICAL EXAMINATION:  ***  General:  WDWN in NAD; vital signs documented above Gait: Not observed HENT: WNL, normocephalic Pulmonary: normal non-labored breathing , without wheezing Cardiac: {Desc; regular/irreg:14544} HR, {With/Without:20273} carotid bruit*** Abdomen: soft, NT; aortic pulse is *** palpable Skin: {With/Without:20273} rashes Vascular Exam/Pulses:  Right Left  Radial {Exam; arterial pulse strength 0-4:30167} {Exam; arterial pulse strength 0-4:30167}  Femoral {Exam; arterial pulse strength 0-4:30167} {Exam; arterial pulse strength 0-4:30167}  Popliteal {Exam; arterial pulse strength 0-4:30167} {Exam; arterial pulse strength 0-4:30167}  DP {Exam; arterial pulse strength 0-4:30167} {Exam; arterial pulse strength 0-4:30167}  PT {Exam; arterial pulse strength 0-4:30167} {Exam; arterial pulse strength 0-4:30167}  Peroneal *** ***   Extremities: {With/Without:20273} ischemic changes, {With/Without:20273} Gangrene , {With/Without:20273} cellulitis; {With/Without:20273} open wounds Musculoskeletal: no muscle wasting or atrophy  Neurologic: A&O X 3 Psychiatric:  The pt has {Desc; normal/abnormal:11317::"Normal"}  affect.   Non-Invasive Vascular Imaging:   ABI's/TBI's on 08/07/2022: Right:  *** - Great toe pressure: *** Left:  *** - Great toe pressure: ***  Arterial duplex on 08/07/2022: ***  Previous ABI's/TBI's on 06/22/2021: Right:  1.85/amp - Great toe pressure: amp Left:  BKA     ASSESSMENT/PLAN:: 72 y.o. male here for follow up for PAD with hx of CLI with RLE tissue loss and underwent angiogram with right CIA angioplasty and stent placement 08/05/2020 by Dr. Chestine Sporelark.  He subsequently underwent right SFA to BK popliteal artery bypass with non reversed GSV on 08/07/2021 by Dr. Randie Heinzain.  In July 2022, he was found to have CLI with occluded bypass and underwent angiogram with stent placement in the right SFA to BK popliteal bypass by Dr. Chestine Sporelark.  In January 2023, he was found to have chronic RLE  limb threatening ischemia with toe ulceration and underwent angiogram with stent of the right SFA by Dr. Randie Heinzain.  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 Randie Heinzain.   Pt was last seen 10/14/2021 and at that time, his toe amputation had healed.  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  He was scheduled for 6 month follow up.     -*** -continue *** -pt will f/u in *** with ***.  Doreatha Massed, [facility] [phone]  Clinic MD:   Karin Lieu

## 2022-08-04 NOTE — Telephone Encounter (Signed)
I received a refill request for Coumadin.  I am just taking over his care for Dr. Hensel, pending his new PCP's arrival.  As discussed with the lab tech, he is non-compliant with INR check f/u and should be on Coumadin 5 mg TTHSS and 7.5 mg on MWF. I called the patient, and his significant other - [name], answered the call. She confirmed his Coumadin regimen and said it used to be managed by his cardiologist, but Hensel had been refilling it since he got discharged from the hospital.  I discussed the need for compliance with the INR check on Coumadin, and she said it is difficult to get him to our office. He sees cardiology more frequently, and they would prefer cardiology to manage his medication. She will contact cardiology to schedule f/u and may f/u with us as well if they prefer. I have given him a short supply of his coumadin pending the INR check. She discussed the need for portable Oxygen. This was previously ordered, but I have yet to hear back from our nursing team. I will send in CCM referral.  Partner was appreciative of the call.  

## 2022-08-04 NOTE — Telephone Encounter (Signed)
Patient's spouse is returning call. 

## 2022-08-04 NOTE — Telephone Encounter (Signed)
Patient spouse calling to schedule with coumadin clinic for Lab INR.  Transferred to coumadin clinic

## 2022-08-04 NOTE — Telephone Encounter (Signed)
I received a refill request for Coumadin.  I am just taking over his care for Dr. Leveda Anna, pending his new PCP's arrival.  As discussed with the lab tech, he is non-compliant with INR check f/u and should be on Coumadin 5 mg TTHSS and 7.5 mg on MWF. I called the patient, and his significant other - [name], answered the call. She confirmed his Coumadin regimen and said it used to be managed by his cardiologist, but Hensel had been refilling it since he got discharged from the hospital.  I discussed the need for compliance with the INR check on Coumadin, and she said it is difficult to get him to our office. He sees cardiology more frequently, and they would prefer cardiology to manage his medication. She will contact cardiology to schedule f/u and may f/u with [place] as well if they prefer. I have given him a short supply of his coumadin pending the INR check. She discussed the need for portable Oxygen. This was previously ordered, but I have yet to hear back from our nursing team. I will send in CCM referral.  Partner was appreciative of the call.

## 2022-08-04 NOTE — Telephone Encounter (Signed)
Patient was told today to have labs done with Cardiologist for warfarin. Requesting call back to discuss.

## 2022-08-04 NOTE — Telephone Encounter (Signed)
Wife returns call to nurse line regarding POC order. Sent community message to [employer] asking for update.   Veronda Prude, RN

## 2022-08-06 NOTE — Telephone Encounter (Signed)
RE: POC Received: Marney Doctor, RN; Nelma Rothman, MD We will need a order for continues with POC o2 use using the templet and sats/results of walk test.  Thanks       Previous Messages    ----- Message ----- From: Veronda Prude, RN Sent: 08/05/2022   9:41 AM EDT To: Santina Evans; Doreene Eland, MD Subject: RE: POC                                        Okay. Does the order need to be placed for evaluate and titrate for POC? Or what is the correct order for this situation?  I am also including the ordering provider on this message thread.  Thanks. ----- Message ----- From: Santina Evans Sent: 08/04/2022   4:53 PM EDT To: Henderson Newcomer; Kathyrn Sheriff; Santina Evans; * Subject: RE: POC                                        Okay so this referral has been sent to [place] a few time but the O2 that patient had with [place] was picked up. So this patient is currently not in service with [place] and will need to be a new start to qualify for POC.  thanks!  ----- Message ----- From: Veronda Prude, RN Sent: 08/04/2022   4:40 PM EDT To: Henderson Newcomer; Kathyrn Sheriff; Santina Evans; * Subject: POC                                            Hey everyone,  This patient's wife wanted to check the status of POC order. Please let me know if we need to do anything else for this request. Thanks.

## 2022-08-06 NOTE — Telephone Encounter (Signed)
Hello Team,  It seems he needs a new ambulatory O2 assessment. He might also need a new face to face encounter to complete his recertification form.  I will message the team to help him set this up.

## 2022-08-06 NOTE — Telephone Encounter (Signed)
Called patient's significant other. She did not answer, LVM asking her to return call to office for scheduling.   Veronda Prude, RN

## 2022-08-06 NOTE — Telephone Encounter (Signed)
Received returned call from [facility] regarding patient. She reports that he is in a wheelchair and would not be able to do a walk test.   Follow up message sent to [employer] to determine next steps.   Veronda Prude, RN

## 2022-08-07 ENCOUNTER — Telehealth: Payer: Self-pay | Admitting: *Deleted

## 2022-08-07 ENCOUNTER — Ambulatory Visit (HOSPITAL_COMMUNITY): Payer: Medicare PPO

## 2022-08-07 ENCOUNTER — Ambulatory Visit: Payer: Medicare PPO

## 2022-08-07 NOTE — Progress Notes (Unsigned)
  Care Coordination  Outreach Note  08/07/2022 Name: Rodney [name] MRN: [id number] DOB: Nov 09, 1950   Care Coordination Outreach Attempts: An unsuccessful telephone outreach was attempted today to offer the patient information about available care coordination services as a benefit of their health plan.   Follow Up Plan:  Additional outreach attempts will be made to offer the patient care coordination information and services.   Encounter Outcome:  No Answer  Christie Nottingham  Care Coordination Care Guide  Direct Dial: [phone]

## 2022-08-11 ENCOUNTER — Ambulatory Visit: Payer: Medicare PPO

## 2022-08-11 NOTE — Progress Notes (Unsigned)
  Care Coordination  Outreach Note  08/11/2022 Name: KOHEN CLEMENSEN MRN: [id number] DOB: 21-Sep-1950   Care Coordination Outreach Attempts: A second unsuccessful outreach was attempted today to offer the patient with information about available care coordination services as a benefit of their health plan.     Follow Up Plan:  Additional outreach attempts will be made to offer the patient care coordination information and services.   Encounter Outcome:  No Answer  Christie Nottingham  Care Coordination Care Guide  Direct Dial: [phone]

## 2022-08-12 NOTE — Progress Notes (Signed)
  Care Coordination  Outreach Note  08/12/2022 Name: Rodney [name] MRN: [id number] DOB: March 14, 1951   Care Coordination Outreach Attempts: A third unsuccessful outreach was attempted today to offer the patient with information about available care coordination services as a benefit of their health plan.   Follow Up Plan:  No further outreach attempts will be made at this time. We have been unable to contact the patient to offer or enroll patient in care coordination services  Encounter Outcome:  No Answer  Christie Nottingham  Care Coordination Care Guide  Direct Dial: [phone]

## 2022-08-14 ENCOUNTER — Telehealth: Payer: Self-pay | Admitting: *Deleted

## 2022-08-14 NOTE — Progress Notes (Signed)
  Care Coordination   Note   08/14/2022 Name: Rodney [name] MRN: [id number] DOB: 09/28/1950  Rodney [name] is a 72 y.o. year old male who sees Doreene Eland, MD for primary care. I reached out to Rodney [name] by phone today to offer care coordination services.  [name] was given information about Care Coordination services today including:   The Care Coordination services include support from the care team which includes your Nurse Coordinator, Clinical Social Worker, or Pharmacist.  The Care Coordination team is here to help remove barriers to the health concerns and goals most important to you. Care Coordination services are voluntary, and the patient may decline or stop services at any time by request to their care team member.   Care Coordination Consent Status: Patient agreed to services and verbal consent obtained.   Follow up plan:  Telephone appointment with care coordination team member scheduled for:  08/18/22  Encounter Outcome:  Pt. Scheduled  [facility] Coordination Care Guide  Direct Dial: [phone]

## 2022-08-18 ENCOUNTER — Ambulatory Visit: Payer: Self-pay

## 2022-08-18 DIAGNOSIS — J441 Chronic obstructive pulmonary disease with (acute) exacerbation: Secondary | ICD-10-CM

## 2022-08-18 DIAGNOSIS — Z789 Other specified health status: Secondary | ICD-10-CM

## 2022-08-18 DIAGNOSIS — I5033 Acute on chronic diastolic (congestive) heart failure: Secondary | ICD-10-CM

## 2022-08-18 NOTE — Patient Outreach (Unsigned)
  Care Coordination   Initial Visit Note   08/18/2022 Name: Rodney [name] MRN: [id number] DOB: [birth year]  Rodney [name] is a 72 y.o. year old male who sees Doreene Eland, MD for primary care. I spoke with  Rodney [name] by phone today.  What matters to the patients health and wellness today?  I had a conversation with Rodney [name]  and Rodney [name] has given his verbal consent for her to speak with me. She mentioned that he is doing well and is currently using oxygen at a rate of 4 liters. Rodney [name] expressed that the primary concern at the moment is transportation. She is not in the best of health and Rodney [name] is weak and confined to a wheelchair. Although he is able to get himself in and out of the wheelchair, Rodney [name] is unable to assist transitio from in and out of the car and the carr has broken down. Therefore, I have made a referral for transportation on their behalf.    Goals Addressed             This Visit's Progress    I want to be able to maintain my breathing       Patient Goals/Self Care Activities: -Patient/Caregiver will self-administer medications as prescribed as evidenced by self-report/primary caregiver report  -Patient/Caregiver will attend all scheduled provider appointments as evidenced by clinician review of documented attendance to scheduled appointments and patient/caregiver report -Patient/Caregiver will call pharmacy for medication refills as evidenced by patient report and review of pharmacy fill history as appropriate -Patient/Caregiver will call provider office for new concerns or questions as evidenced by review of documented incoming telephone call notes and patient report -Patient/Caregiver verbalizes understanding of plan -Patient/Caregiver will focus on medication adherence by taking medications as prescribed  -Avoid smoke and air pollution -Keep your airway clear from mucus build up  -Use a humidifier, if needed -Self assess COPD action plan  zone and make appointment with provider if you have been in the yellow zone for 48 hours without improvement. -Utilize infection prevention strategies to reduce risk of respiratory infection           SDOH assessments and interventions completed:  Yes  SDOH Interventions Today    Flowsheet Row Most Recent Value  SDOH Interventions   Transportation Interventions Other (Comment)  [Care Guides]        Care Coordination Interventions:  Yes, provided   Interventions Today    Flowsheet Row Most Recent Value  Chronic Disease   Chronic disease during today's visit Chronic Obstructive Pulmonary Disease (COPD)  General Interventions   General Interventions Discussed/Reviewed General Interventions Discussed, General Interventions Reviewed  Safety Interventions   Safety Discussed/Reviewed Safety Discussed        Follow up plan: Follow up call scheduled for 09/02/22  130 pm    Encounter Outcome:  Pt. Visit Completed   Juanell Fairly RN, BSN, [facility] Care Coordinator [facility]   Phone: [phone]

## 2022-08-19 ENCOUNTER — Ambulatory Visit: Payer: Medicare PPO

## 2022-08-19 NOTE — Patient Instructions (Signed)
Visit Information  Thank you for taking time to visit with me today. Please don't hesitate to contact me if I can be of assistance to you.   Following are the goals we discussed today:   Goals Addressed             This Visit's Progress    I want to be able to maintain my breathing       Patient Goals/Self Care Activities: -Patient/Caregiver will self-administer medications as prescribed as evidenced by self-report/primary caregiver report  -Patient/Caregiver will attend all scheduled provider appointments as evidenced by clinician review of documented attendance to scheduled appointments and patient/caregiver report -Patient/Caregiver will call pharmacy for medication refills as evidenced by patient report and review of pharmacy fill history as appropriate -Patient/Caregiver will call provider office for new concerns or questions as evidenced by review of documented incoming telephone call notes and patient report -Patient/Caregiver verbalizes understanding of plan -Patient/Caregiver will focus on medication adherence by taking medications as prescribed  -Avoid smoke and air pollution -Keep your airway clear from mucus build up  -Use a humidifier, if needed -Self assess COPD action plan zone and make appointment with provider if you have been in the yellow zone for 48 hours without improvement. -Utilize infection prevention strategies to reduce risk of respiratory infection           Our next appointment is by telephone on 09/02/22 at 130  pm  Please call the care guide team at [phone] if you need to cancel or reschedule your appointment.   If you are experiencing a Mental Health or Behavioral Health Crisis or need someone to talk to, please call 1-800-273-TALK (toll free, 24 hour hotline)  The patient verbalized understanding of instructions, educational materials, and care plan provided today.    Juanell Fairly RN, BSN, [facility] Care Coordinator [facility]    Phone: [phone]

## 2022-08-20 ENCOUNTER — Telehealth: Payer: Self-pay | Admitting: *Deleted

## 2022-08-20 NOTE — Telephone Encounter (Signed)
   Telephone encounter was:  Unsuccessful.  08/20/2022 Name: MACALISTER ARNAUD MRN: [id number] DOB: November 21, 1950  Unsuccessful outbound call made today to assist with:  Transportation Needs   Outreach Attempt:  1st Attempt  A HIPAA compliant voice message was left requesting a return call.  Instructed patient to call back at [phone]. Yehuda Mao Greenauer -[facility], [facility] [place], Population Health [phone] [address] [place] , [place] [address] Email : [email or web address]

## 2022-08-21 ENCOUNTER — Telehealth: Payer: Self-pay | Admitting: *Deleted

## 2022-08-21 NOTE — Telephone Encounter (Signed)
   Telephone encounter was:  Unsuccessful.  08/21/2022 Name: Rodney [name] MRN: [id number] DOB: 1950-10-07  Unsuccessful outbound call made today to assist with:  Transportation Needs   Outreach Attempt:  2nd Attempt  A HIPAA compliant voice message was left requesting a return call.  Instructed patient to call back at [phone].  Yehuda Mao Greenauer -[facility] [place], Population Health [phone] [address] [place] , [place] [address] Email : [email or web address]

## 2022-08-25 ENCOUNTER — Ambulatory Visit: Payer: Medicare PPO | Attending: Cardiovascular Disease

## 2022-08-25 ENCOUNTER — Telehealth: Payer: Self-pay | Admitting: *Deleted

## 2022-08-25 NOTE — Telephone Encounter (Signed)
   Telephone encounter was:  Unsuccessful.  08/25/2022 Name: Rodney [name] MRN: [id number] DOB: 07-20-1950  Unsuccessful outbound call made today to assist with:  Transportation Needs   Outreach Attempt:  3rd Attempt.  Referral closed unable to contact patient.  A HIPAA compliant voice message was left requesting a return call.  Instructed patient to call back at [phone].  Yehuda Mao Greenauer -[facility] [place], Population Health [phone] [address] [place] , [place] [address] Email : [email or web address]

## 2022-09-04 ENCOUNTER — Telehealth: Payer: Self-pay | Admitting: Family Medicine

## 2022-09-04 NOTE — Telephone Encounter (Signed)
I received a death cert request for this patient. Unfortunately, I did not receive any information about his passing. I called his wife today, and she confirmed his death date of 09/01/2022 between 11 am and 1 pm. He stopped breathing. She attempted CPR with no success. They never took him to the hospital. However, medical examiners came by to pick up his body. I expressed my condolences to her, and she appreciated the call. I called [facility] [employer] [facility] [employer] [facility] to notify them of the completion of his death cert request.

## 2022-09-11 ENCOUNTER — Other Ambulatory Visit (HOSPITAL_COMMUNITY): Payer: Medicare PPO

## 2022-09-11 ENCOUNTER — Encounter (HOSPITAL_COMMUNITY): Payer: Medicare PPO

## 2022-09-11 ENCOUNTER — Ambulatory Visit: Payer: Medicare PPO

## 2022-09-26 DEATH — deceased

## 2022-11-11 ENCOUNTER — Other Ambulatory Visit (HOSPITAL_COMMUNITY): Payer: Self-pay
